# Patient Record
Sex: Male | Born: 1945 | Race: White | Hispanic: Yes | Smoking: Never smoker
Health system: Southern US, Community
[De-identification: ages and names within clinical notes are randomized; demographics above are authoritative.]

## PROBLEM LIST (undated history)

## (undated) DIAGNOSIS — Z95811 Presence of heart assist device: Secondary | ICD-10-CM

## (undated) DIAGNOSIS — Z95810 Presence of automatic (implantable) cardiac defibrillator: Secondary | ICD-10-CM

## (undated) DIAGNOSIS — I4821 Permanent atrial fibrillation: Secondary | ICD-10-CM

## (undated) DIAGNOSIS — Z950 Presence of cardiac pacemaker: Secondary | ICD-10-CM

## (undated) DIAGNOSIS — N186 End stage renal disease: Secondary | ICD-10-CM

## (undated) DIAGNOSIS — E119 Type 2 diabetes mellitus without complications: Secondary | ICD-10-CM

## (undated) DIAGNOSIS — J45909 Unspecified asthma, uncomplicated: Secondary | ICD-10-CM

## (undated) DIAGNOSIS — I509 Heart failure, unspecified: Secondary | ICD-10-CM

## (undated) DIAGNOSIS — N289 Disorder of kidney and ureter, unspecified: Secondary | ICD-10-CM

## (undated) DIAGNOSIS — K5520 Angiodysplasia of colon without hemorrhage: Secondary | ICD-10-CM

## (undated) DIAGNOSIS — G4730 Sleep apnea, unspecified: Secondary | ICD-10-CM

## (undated) HISTORY — DX: Unspecified asthma, uncomplicated: J45.909

## (undated) HISTORY — DX: Disorder of kidney and ureter, unspecified: N28.9

## (undated) HISTORY — DX: Type 2 diabetes mellitus without complications: E11.9

## (undated) HISTORY — DX: Heart failure, unspecified: I50.9

## (undated) HISTORY — DX: Sleep apnea, unspecified: G47.30

---

## 2008-11-04 HISTORY — PX: CORONARY ARTERY BYPASS GRAFT: SHX141

## 2011-11-05 HISTORY — PX: CORONARY ANGIOPLASTY WITH STENT PLACEMENT: SHX49

## 2013-11-04 HISTORY — PX: AORTIC VALVE REPLACEMENT: SHX41

## 2016-11-27 ENCOUNTER — Ambulatory Visit (INDEPENDENT_AMBULATORY_CARE_PROVIDER_SITE_OTHER): Payer: Medicare Other | Admitting: Emergency Medicine

## 2016-11-27 ENCOUNTER — Other Ambulatory Visit (INDEPENDENT_AMBULATORY_CARE_PROVIDER_SITE_OTHER): Payer: Medicare Other

## 2016-11-27 ENCOUNTER — Inpatient Hospital Stay (HOSPITAL_COMMUNITY)
Admission: EM | Admit: 2016-11-27 | Discharge: 2017-01-01 | DRG: 001 | Disposition: A | Discharge status: Home Health | Payer: Medicare Other | Attending: Cardiology | Admitting: Cardiology

## 2016-11-27 ENCOUNTER — Emergency Department (HOSPITAL_COMMUNITY): Payer: Medicare Other

## 2016-11-27 ENCOUNTER — Encounter (HOSPITAL_COMMUNITY): Payer: Self-pay | Admitting: Emergency Medicine

## 2016-11-27 ENCOUNTER — Encounter: Payer: Self-pay | Admitting: Emergency Medicine

## 2016-11-27 VITALS — BP 98/62 | HR 96 | Ht 63.0 in | Wt 206.5 lb

## 2016-11-27 DIAGNOSIS — R570 Cardiogenic shock: Secondary | ICD-10-CM | POA: Diagnosis not present

## 2016-11-27 DIAGNOSIS — D62 Acute posthemorrhagic anemia: Secondary | ICD-10-CM | POA: Diagnosis not present

## 2016-11-27 DIAGNOSIS — I1310 Hypertensive heart and chronic kidney disease without heart failure, with stage 1 through stage 4 chronic kidney disease, or unspecified chronic kidney disease: Secondary | ICD-10-CM | POA: Diagnosis present

## 2016-11-27 DIAGNOSIS — J189 Pneumonia, unspecified organism: Secondary | ICD-10-CM

## 2016-11-27 DIAGNOSIS — G4733 Obstructive sleep apnea (adult) (pediatric): Secondary | ICD-10-CM | POA: Diagnosis present

## 2016-11-27 DIAGNOSIS — N183 Chronic kidney disease, stage 3 (moderate): Secondary | ICD-10-CM | POA: Diagnosis present

## 2016-11-27 DIAGNOSIS — D696 Thrombocytopenia, unspecified: Secondary | ICD-10-CM | POA: Diagnosis present

## 2016-11-27 DIAGNOSIS — B965 Pseudomonas (aeruginosa) (mallei) (pseudomallei) as the cause of diseases classified elsewhere: Secondary | ICD-10-CM | POA: Diagnosis not present

## 2016-11-27 DIAGNOSIS — Z01818 Encounter for other preprocedural examination: Secondary | ICD-10-CM

## 2016-11-27 DIAGNOSIS — N189 Chronic kidney disease, unspecified: Secondary | ICD-10-CM

## 2016-11-27 DIAGNOSIS — Z95810 Presence of automatic (implantable) cardiac defibrillator: Secondary | ICD-10-CM

## 2016-11-27 DIAGNOSIS — I509 Heart failure, unspecified: Secondary | ICD-10-CM

## 2016-11-27 DIAGNOSIS — D638 Anemia in other chronic diseases classified elsewhere: Secondary | ICD-10-CM | POA: Diagnosis present

## 2016-11-27 DIAGNOSIS — E1122 Type 2 diabetes mellitus with diabetic chronic kidney disease: Secondary | ICD-10-CM | POA: Diagnosis present

## 2016-11-27 DIAGNOSIS — I272 Pulmonary hypertension, unspecified: Secondary | ICD-10-CM | POA: Diagnosis present

## 2016-11-27 DIAGNOSIS — Z515 Encounter for palliative care: Secondary | ICD-10-CM

## 2016-11-27 DIAGNOSIS — G4700 Insomnia, unspecified: Secondary | ICD-10-CM | POA: Diagnosis present

## 2016-11-27 DIAGNOSIS — I482 Chronic atrial fibrillation: Secondary | ICD-10-CM | POA: Diagnosis present

## 2016-11-27 DIAGNOSIS — Z9889 Other specified postprocedural states: Secondary | ICD-10-CM

## 2016-11-27 DIAGNOSIS — I5023 Acute on chronic systolic (congestive) heart failure: Secondary | ICD-10-CM | POA: Diagnosis present

## 2016-11-27 DIAGNOSIS — Y95 Nosocomial condition: Secondary | ICD-10-CM | POA: Diagnosis not present

## 2016-11-27 DIAGNOSIS — N179 Acute kidney failure, unspecified: Secondary | ICD-10-CM | POA: Diagnosis present

## 2016-11-27 DIAGNOSIS — Z951 Presence of aortocoronary bypass graft: Secondary | ICD-10-CM

## 2016-11-27 DIAGNOSIS — I130 Hypertensive heart and chronic kidney disease with heart failure and stage 1 through stage 4 chronic kidney disease, or unspecified chronic kidney disease: Principal | ICD-10-CM | POA: Diagnosis present

## 2016-11-27 DIAGNOSIS — R262 Difficulty in walking, not elsewhere classified: Secondary | ICD-10-CM

## 2016-11-27 DIAGNOSIS — Z7189 Other specified counseling: Secondary | ICD-10-CM

## 2016-11-27 DIAGNOSIS — J9811 Atelectasis: Secondary | ICD-10-CM | POA: Diagnosis not present

## 2016-11-27 DIAGNOSIS — J45909 Unspecified asthma, uncomplicated: Secondary | ICD-10-CM | POA: Diagnosis present

## 2016-11-27 DIAGNOSIS — I2510 Atherosclerotic heart disease of native coronary artery without angina pectoris: Secondary | ICD-10-CM | POA: Diagnosis present

## 2016-11-27 DIAGNOSIS — J151 Pneumonia due to Pseudomonas: Secondary | ICD-10-CM | POA: Diagnosis not present

## 2016-11-27 DIAGNOSIS — Z7982 Long term (current) use of aspirin: Secondary | ICD-10-CM

## 2016-11-27 DIAGNOSIS — I472 Ventricular tachycardia: Secondary | ICD-10-CM | POA: Diagnosis not present

## 2016-11-27 DIAGNOSIS — Z452 Encounter for adjustment and management of vascular access device: Secondary | ICD-10-CM

## 2016-11-27 DIAGNOSIS — Z79899 Other long term (current) drug therapy: Secondary | ICD-10-CM

## 2016-11-27 DIAGNOSIS — Z952 Presence of prosthetic heart valve: Secondary | ICD-10-CM | POA: Insufficient documentation

## 2016-11-27 DIAGNOSIS — D689 Coagulation defect, unspecified: Secondary | ICD-10-CM | POA: Diagnosis not present

## 2016-11-27 DIAGNOSIS — I255 Ischemic cardiomyopathy: Secondary | ICD-10-CM

## 2016-11-27 DIAGNOSIS — F419 Anxiety disorder, unspecified: Secondary | ICD-10-CM | POA: Diagnosis present

## 2016-11-27 DIAGNOSIS — R079 Chest pain, unspecified: Secondary | ICD-10-CM

## 2016-11-27 DIAGNOSIS — J440 Chronic obstructive pulmonary disease with acute lower respiratory infection: Secondary | ICD-10-CM | POA: Diagnosis present

## 2016-11-27 DIAGNOSIS — E871 Hypo-osmolality and hyponatremia: Secondary | ICD-10-CM | POA: Diagnosis present

## 2016-11-27 DIAGNOSIS — Z7901 Long term (current) use of anticoagulants: Secondary | ICD-10-CM

## 2016-11-27 DIAGNOSIS — I5082 Biventricular heart failure: Secondary | ICD-10-CM | POA: Diagnosis present

## 2016-11-27 DIAGNOSIS — K7200 Acute and subacute hepatic failure without coma: Secondary | ICD-10-CM | POA: Diagnosis not present

## 2016-11-27 DIAGNOSIS — J9600 Acute respiratory failure, unspecified whether with hypoxia or hypercapnia: Secondary | ICD-10-CM | POA: Diagnosis not present

## 2016-11-27 DIAGNOSIS — R0602 Shortness of breath: Secondary | ICD-10-CM | POA: Diagnosis not present

## 2016-11-27 DIAGNOSIS — Z95811 Presence of heart assist device: Secondary | ICD-10-CM

## 2016-11-27 DIAGNOSIS — I071 Rheumatic tricuspid insufficiency: Secondary | ICD-10-CM | POA: Diagnosis present

## 2016-11-27 DIAGNOSIS — Z419 Encounter for procedure for purposes other than remedying health state, unspecified: Secondary | ICD-10-CM

## 2016-11-27 DIAGNOSIS — J4540 Moderate persistent asthma, uncomplicated: Secondary | ICD-10-CM

## 2016-11-27 DIAGNOSIS — E11649 Type 2 diabetes mellitus with hypoglycemia without coma: Secondary | ICD-10-CM | POA: Diagnosis present

## 2016-11-27 LAB — BASIC METABOLIC PANEL
BUN: 97 mg/dL — AB (ref 6–23)
CO2: 28 meq/L (ref 19–32)
Calcium: 9.3 mg/dL (ref 8.4–10.5)
Chloride: 98 mEq/L (ref 96–112)
Creatinine, Ser: 2.29 mg/dL — ABNORMAL HIGH (ref 0.40–1.50)
GFR: 30.08 mL/min — ABNORMAL LOW (ref >60.00)
GLUCOSE: 78 mg/dL (ref 70–99)
POTASSIUM: 4.6 meq/L (ref 3.5–5.1)
SODIUM: 135 meq/L (ref 135–145)

## 2016-11-27 LAB — CBC
HCT: 27.1 % — ABNORMAL LOW (ref 39.0–52.0)
HEMOGLOBIN: 8.9 g/dL — AB (ref 13.0–17.0)
MCH: 31.1 pg (ref 26.0–34.0)
MCHC: 32.8 g/dL (ref 30.0–36.0)
MCV: 94.8 fL (ref 78.0–100.0)
Platelets: 136 10*3/uL — ABNORMAL LOW (ref 150–400)
RBC: 2.86 MIL/uL — AB (ref 4.22–5.81)
RDW: 17.4 % — ABNORMAL HIGH (ref 11.5–15.5)
WBC: 7.6 10*3/uL (ref 4.0–10.5)

## 2016-11-27 LAB — I-STAT TROPONIN, ED: TROPONIN I, POC: 0.07 ng/mL (ref 0.00–0.08)

## 2016-11-27 LAB — BRAIN NATRIURETIC PEPTIDE: Pro B Natriuretic peptide (BNP): 1746 pg/mL — ABNORMAL HIGH (ref 0.0–100.0)

## 2016-11-27 MED ORDER — FLUTICASONE PROPIONATE HFA 110 MCG/ACT IN AERO: 2.0000 | INHALATION_SPRAY | Freq: Two times a day (BID) | RESPIRATORY_TRACT | 5 refills | Status: DC

## 2016-11-27 MED ORDER — ALBUTEROL SULFATE HFA 108 (90 BASE) MCG/ACT IN AERS: 2.0000 | INHALATION_SPRAY | RESPIRATORY_TRACT | 5 refills | Status: DC | PRN

## 2016-11-27 MED ORDER — ALBUTEROL SULFATE (2.5 MG/3ML) 0.083% IN NEBU
2.5000 mg | INHALATION_SOLUTION | RESPIRATORY_TRACT | 5 refills | Status: DC
Start: 2016-11-27 — End: 2018-03-05

## 2016-11-27 NOTE — Progress Notes (Signed)
Subjective:    Patient ID: Trevor [name], male    DOB: November 08, 1945, 71 y.o.   MRN: [id number]  HPI 71 year old never smoker with a history of CAD / CABG 2010, tissue AVR in May [id number] and systolic CHF for which he has been followed exclusively in [place]. Also with a history of fixed (lifelong) asthma / COPD, OSA on CPAP. He moved here about a week ago and has gained about 5 pounds, is gaining some edema. Per his home CHF coordinator he increased his torsemide last night. He was just d/c from [facility] in [facility] on 1/9 for CHF exac and then associated renal failure. He underwent ultrafiltration but not HD. Has been in hospital 4x in the last 4 months. He has a new CPAP pressure since 6 weeks ago, has been less compliant with CPAP due to this change to auto-set 5-20 (used to be on 7cm H2O).  Last wore it last night, feels that the pressure is too high to tolerate. He was unable to lay flat last night.   I don't have any records from his [facility] healthcare providers, but do have their contact numbers to obtain.   He reports that he has run out of ProAir today, Flovent, albuterol nebs. Had been using ProAir more frequently. Stiolto was on his discharge medication list from hospital but he is not taking.   Review of Systems  Constitutional: Positive for unexpected weight change. Negative for fever.  HENT: Positive for congestion. Negative for dental problem, ear pain, nosebleeds, postnasal drip, rhinorrhea, sinus pressure, sneezing, sore throat and trouble swallowing.   Eyes: Negative for redness and itching.  Respiratory: Positive for cough, chest tightness and shortness of breath. Negative for wheezing.   Cardiovascular: Positive for leg swelling. Negative for palpitations.  Gastrointestinal: Negative for nausea and vomiting.  Genitourinary: Negative for dysuria.  Musculoskeletal: Positive for joint swelling.  Skin: Negative for rash.  Neurological: Negative for headaches.  Hematological: Does not  bruise/bleed easily.  Psychiatric/Behavioral: Negative for dysphoric mood. The patient is nervous/anxious.    Past Medical History:  Diagnosis Date  . Asthma   . CHF (congestive heart failure) ([place])   . Diabetes ([place])   . Kidney disease   . Sleep apnea      No family history on file.   Social History   Social History  . Marital status: Widowed    Spouse name: N/A  . Number of children: N/A  . Years of education: N/A   Occupational History  . Not on file.   Social History Main Topics  . Smoking status: Never Smoker  . Smokeless tobacco: Never Used  . Alcohol use No  . Drug use: No  . Sexual activity: Not on file   Other Topics Concern  . Not on file   Social History Narrative   Patient is a widow. Moved to [place] from [place], [place]. Currently lives with his sister Breaker Capito.      No Known Allergies   No outpatient prescriptions prior to visit.   No facility-administered medications prior to visit.    Allergies as of 11/27/2016   No Known Allergies     Medication List       Accurate as of 11/27/16  1:27 PM. Always use your most recent med list.          albuterol 108 (90 Base) MCG/ACT inhaler Commonly known as:  PROVENTIL HFA;VENTOLIN HFA Inhale 2 puffs into the lungs every 4 (four) hours  as needed for wheezing or shortness of breath.   albuterol (2.5 MG/3ML) 0.083% nebulizer solution Commonly known as:  PROVENTIL Take 3 mLs (2.5 mg total) by nebulization every 4 (four) hours. And as needed   apixaban 2.5 MG Tabs tablet Commonly known as:  ELIQUIS Take 2.5 mg by mouth 2 (two) times daily.   ascorbic acid 1000 MG tablet Commonly known as:  VITAMIN C Take 1,000 mg by mouth daily.   aspirin EC 81 MG tablet Take 81 mg by mouth daily.   atorvastatin 40 MG tablet Commonly known as:  LIPITOR Take 40 mg by mouth daily.   benzonatate 100 MG capsule Commonly known as:  TESSALON Take by mouth 3 (three) times daily as needed for cough.     carvedilol 6.25 MG tablet Commonly known as:  COREG Take 6.25 mg by mouth 2 (two) times daily with a meal.   ENTRESTO 24-26 MG Generic drug:  sacubitril-valsartan Take 1 tablet by mouth 2 (two) times daily.   fluticasone 110 MCG/ACT inhaler Commonly known as:  FLOVENT HFA Inhale 2 puffs into the lungs 2 (two) times daily.   fluticasone 50 MCG/ACT nasal spray Commonly known as:  FLONASE Place into both nostrils daily.   hydrALAZINE 25 MG tablet Commonly known as:  APRESOLINE Take 25 mg by mouth 3 (three) times daily.   insulin glargine 100 UNIT/ML injection Commonly known as:  LANTUS Inject 20 Units into the skin at bedtime.   insulin lispro 100 UNIT/ML injection Commonly known as:  HUMALOG Inject 50 Units into the skin 3 (three) times daily before meals.   isosorbide dinitrate 30 MG tablet Commonly known as:  ISORDIL Take 15 mg by mouth 2 (two) times daily.   isosorbide mononitrate 30 MG 24 hr tablet Commonly known as:  IMDUR Take 30 mg by mouth daily.   levothyroxine 25 MCG tablet Commonly known as:  SYNTHROID, LEVOTHROID Take 25 mcg by mouth daily before breakfast.   multivitamin tablet Take 1 tablet by mouth daily.   potassium chloride SA 20 MEQ tablet Commonly known as:  K-DUR,KLOR-CON Take 20 mEq by mouth 2 (two) times daily.   STIOLTO RESPIMAT 2.5-2.5 MCG/ACT Aers Generic drug:  Tiotropium Bromide-Olodaterol Inhale 2 puffs into the lungs daily.   torsemide 20 MG tablet Commonly known as:  DEMADEX Take 40 mg by mouth daily.   traMADol 50 MG tablet Commonly known as:  ULTRAM Take 50 mg by mouth every 6 (six) hours as needed.          Objective:   Physical Exam Vitals:   11/27/16 1104  BP: 98/62  Pulse: 96  Weight: 206 lb 8 oz (93.7 kg)  Height: 5\' 3"  (1.6 m)   'Gen: Pleasant, well-nourished, in no distress,  normal affect on [place] O2  ENT: No lesions,  mouth clear,  oropharynx clear, no postnasal drip  Neck: No JVD, no TMG, no carotid  bruits  Lungs: No use of accessory muscles, clear without rales or rhonchi, no wheeze  Cardiovascular: RRR, heart sounds normal, no murmur or gallops, trace peripheral edema  Musculoskeletal: No deformities, no cyanosis or clubbing  Neuro: alert, non focal  Skin: Warm, no lesions or rashes       Assessment & Plan:  Asthma, chronic Plan to continue Stiolto. We will refill albuterol nebulizer and pro-air. Continue Flovent.  Cardiomyopathy, ischemic Management has been difficult and his symptoms have been labile. Note his recent hospitalizations. I will continue his existing medications including the increased torsemide  and refer him as soon as possible to our Advanced CHF clinic. Will attempt to get records from [facility]. Check BNP and BMP today  Chronic renal insufficiency Will check renal panel in preparation for probable adjustment of his diuretic regimen by cardiology   Status post aortic valve replacement He is on eliquis.   OSA (obstructive sleep apnea) Continue current auto-set 5-20 cm H2O. May be able to adjust based on his most recent PSG results. Will obtain from [facility]  Baltazar Apo, MD, PhD 11/27/2016, 1:27 PM [facility] [phone] or if no answer [phone]

## 2016-11-27 NOTE — Assessment & Plan Note (Signed)
He is on eliquis

## 2016-11-27 NOTE — ED Triage Notes (Signed)
Patient arrives via EMS with complaint of shortness of breath, lower extremity swelling/pain, and headache. States onset was 2-3 days ago. History of CHF with admission earlier this month in [place] for similar.

## 2016-11-27 NOTE — Assessment & Plan Note (Signed)
Continue current auto-set 5-20 cm H2O. May be able to adjust based on his most recent PSG results. Will obtain from [facility]

## 2016-11-27 NOTE — Assessment & Plan Note (Signed)
Plan to continue [place]. We will refill albuterol nebulizer and pro-air. Continue Flovent.

## 2016-11-27 NOTE — Assessment & Plan Note (Signed)
Will check renal panel in preparation for probable adjustment of his diuretic regimen by cardiology

## 2016-11-27 NOTE — Patient Instructions (Signed)
We will refill your albuterol, ProAir, Flovent so that you can resume them Continue current CPAP for now as best you can. We will obtain your sleep study from [facility] and see if adjustments can be made.  You need to be seen ASAP in our Advanced CHF clinic. We will work on this.  For now continue your torsemide at twice normal dose until you reach your target dry weight.  Lab work today to check your kidney function.  Follow with Dr Lamonte Sakai in 1 month

## 2016-11-27 NOTE — Assessment & Plan Note (Addendum)
Management has been difficult and his symptoms have been labile. Note his recent hospitalizations. I will continue his existing medications including the increased torsemide and refer him as soon as possible to our Advanced CHF clinic. Will attempt to get records from [facility]. Check BNP and BMP today

## 2016-11-28 ENCOUNTER — Inpatient Hospital Stay (HOSPITAL_COMMUNITY): Payer: Medicare Other

## 2016-11-28 ENCOUNTER — Inpatient Hospital Stay (HOSPITAL_COMMUNITY): Admission: RE | Admit: 2016-11-28 | Payer: Medicare Other | Source: Ambulatory Visit | Admitting: Internal Medicine

## 2016-11-28 ENCOUNTER — Encounter (HOSPITAL_COMMUNITY): Payer: Self-pay | Admitting: Internal Medicine

## 2016-11-28 DIAGNOSIS — I1310 Hypertensive heart and chronic kidney disease without heart failure, with stage 1 through stage 4 chronic kidney disease, or unspecified chronic kidney disease: Secondary | ICD-10-CM | POA: Diagnosis present

## 2016-11-28 DIAGNOSIS — I472 Ventricular tachycardia: Secondary | ICD-10-CM | POA: Diagnosis not present

## 2016-11-28 DIAGNOSIS — I130 Hypertensive heart and chronic kidney disease with heart failure and stage 1 through stage 4 chronic kidney disease, or unspecified chronic kidney disease: Secondary | ICD-10-CM | POA: Diagnosis not present

## 2016-11-28 DIAGNOSIS — Z515 Encounter for palliative care: Secondary | ICD-10-CM | POA: Diagnosis not present

## 2016-11-28 DIAGNOSIS — I361 Nonrheumatic tricuspid (valve) insufficiency: Secondary | ICD-10-CM | POA: Diagnosis not present

## 2016-11-28 DIAGNOSIS — Y95 Nosocomial condition: Secondary | ICD-10-CM | POA: Diagnosis not present

## 2016-11-28 DIAGNOSIS — N19 Unspecified kidney failure: Secondary | ICD-10-CM

## 2016-11-28 DIAGNOSIS — R0602 Shortness of breath: Secondary | ICD-10-CM | POA: Diagnosis present

## 2016-11-28 DIAGNOSIS — N170 Acute kidney failure with tubular necrosis: Secondary | ICD-10-CM

## 2016-11-28 DIAGNOSIS — Z01810 Encounter for preprocedural cardiovascular examination: Secondary | ICD-10-CM | POA: Diagnosis not present

## 2016-11-28 DIAGNOSIS — D689 Coagulation defect, unspecified: Secondary | ICD-10-CM | POA: Diagnosis not present

## 2016-11-28 DIAGNOSIS — E871 Hypo-osmolality and hyponatremia: Secondary | ICD-10-CM | POA: Diagnosis present

## 2016-11-28 DIAGNOSIS — D62 Acute posthemorrhagic anemia: Secondary | ICD-10-CM | POA: Diagnosis not present

## 2016-11-28 DIAGNOSIS — I5023 Acute on chronic systolic (congestive) heart failure: Secondary | ICD-10-CM

## 2016-11-28 DIAGNOSIS — Z952 Presence of prosthetic heart valve: Secondary | ICD-10-CM | POA: Diagnosis not present

## 2016-11-28 DIAGNOSIS — N183 Chronic kidney disease, stage 3 (moderate): Secondary | ICD-10-CM

## 2016-11-28 DIAGNOSIS — Z951 Presence of aortocoronary bypass graft: Secondary | ICD-10-CM | POA: Diagnosis not present

## 2016-11-28 DIAGNOSIS — I501 Left ventricular failure: Secondary | ICD-10-CM | POA: Diagnosis not present

## 2016-11-28 DIAGNOSIS — Z95811 Presence of heart assist device: Secondary | ICD-10-CM | POA: Diagnosis not present

## 2016-11-28 DIAGNOSIS — J9811 Atelectasis: Secondary | ICD-10-CM | POA: Diagnosis not present

## 2016-11-28 DIAGNOSIS — J151 Pneumonia due to Pseudomonas: Secondary | ICD-10-CM | POA: Diagnosis not present

## 2016-11-28 DIAGNOSIS — N179 Acute kidney failure, unspecified: Secondary | ICD-10-CM | POA: Diagnosis present

## 2016-11-28 DIAGNOSIS — I255 Ischemic cardiomyopathy: Secondary | ICD-10-CM | POA: Diagnosis present

## 2016-11-28 DIAGNOSIS — I509 Heart failure, unspecified: Secondary | ICD-10-CM | POA: Diagnosis not present

## 2016-11-28 DIAGNOSIS — D638 Anemia in other chronic diseases classified elsewhere: Secondary | ICD-10-CM | POA: Diagnosis present

## 2016-11-28 DIAGNOSIS — J9600 Acute respiratory failure, unspecified whether with hypoxia or hypercapnia: Secondary | ICD-10-CM | POA: Diagnosis not present

## 2016-11-28 DIAGNOSIS — I340 Nonrheumatic mitral (valve) insufficiency: Secondary | ICD-10-CM | POA: Diagnosis not present

## 2016-11-28 DIAGNOSIS — I482 Chronic atrial fibrillation: Secondary | ICD-10-CM | POA: Diagnosis not present

## 2016-11-28 DIAGNOSIS — N184 Chronic kidney disease, stage 4 (severe): Secondary | ICD-10-CM | POA: Diagnosis not present

## 2016-11-28 DIAGNOSIS — Z7189 Other specified counseling: Secondary | ICD-10-CM | POA: Diagnosis not present

## 2016-11-28 DIAGNOSIS — G4733 Obstructive sleep apnea (adult) (pediatric): Secondary | ICD-10-CM | POA: Diagnosis present

## 2016-11-28 DIAGNOSIS — I2510 Atherosclerotic heart disease of native coronary artery without angina pectoris: Secondary | ICD-10-CM | POA: Diagnosis not present

## 2016-11-28 DIAGNOSIS — N189 Chronic kidney disease, unspecified: Secondary | ICD-10-CM

## 2016-11-28 DIAGNOSIS — E1122 Type 2 diabetes mellitus with diabetic chronic kidney disease: Secondary | ICD-10-CM | POA: Diagnosis not present

## 2016-11-28 DIAGNOSIS — I5082 Biventricular heart failure: Secondary | ICD-10-CM | POA: Diagnosis present

## 2016-11-28 DIAGNOSIS — K7200 Acute and subacute hepatic failure without coma: Secondary | ICD-10-CM | POA: Diagnosis not present

## 2016-11-28 DIAGNOSIS — J440 Chronic obstructive pulmonary disease with acute lower respiratory infection: Secondary | ICD-10-CM | POA: Diagnosis present

## 2016-11-28 DIAGNOSIS — J4540 Moderate persistent asthma, uncomplicated: Secondary | ICD-10-CM

## 2016-11-28 DIAGNOSIS — I5043 Acute on chronic combined systolic (congestive) and diastolic (congestive) heart failure: Secondary | ICD-10-CM | POA: Diagnosis not present

## 2016-11-28 DIAGNOSIS — E11649 Type 2 diabetes mellitus with hypoglycemia without coma: Secondary | ICD-10-CM | POA: Diagnosis present

## 2016-11-28 DIAGNOSIS — D696 Thrombocytopenia, unspecified: Secondary | ICD-10-CM | POA: Diagnosis present

## 2016-11-28 DIAGNOSIS — R570 Cardiogenic shock: Secondary | ICD-10-CM | POA: Diagnosis not present

## 2016-11-28 LAB — URINALYSIS, ROUTINE W REFLEX MICROSCOPIC
BILIRUBIN URINE: NEGATIVE
GLUCOSE, UA: NEGATIVE mg/dL
HGB URINE DIPSTICK: NEGATIVE
KETONES UR: NEGATIVE mg/dL
LEUKOCYTES UA: NEGATIVE
NITRITE: NEGATIVE
PROTEIN: 30 mg/dL — AB
Specific Gravity, Urine: 1.008 (ref 1.005–1.030)
Squamous Epithelial / LPF: NONE SEEN
pH: 5 (ref 5.0–8.0)

## 2016-11-28 LAB — BASIC METABOLIC PANEL
Anion gap: 10 (ref 5–15)
BUN: 99 mg/dL — AB (ref 6–20)
CALCIUM: 9.2 mg/dL (ref 8.9–10.3)
CO2: 27 mmol/L (ref 22–32)
CREATININE: 2.39 mg/dL — AB (ref 0.61–1.24)
Chloride: 98 mmol/L — ABNORMAL LOW (ref 101–111)
GFR calc non Af Amer: 26 mL/min — ABNORMAL LOW (ref >60)
GFR, EST AFRICAN AMERICAN: 30 mL/min — AB (ref >60)
GLUCOSE: 67 mg/dL (ref 65–99)
Potassium: 4.9 mmol/L (ref 3.5–5.1)
Sodium: 135 mmol/L (ref 135–145)

## 2016-11-28 LAB — COMPREHENSIVE METABOLIC PANEL
ALT: 34 U/L (ref 17–63)
AST: 46 U/L — AB (ref 15–41)
Albumin: 3.5 g/dL (ref 3.5–5.0)
Alkaline Phosphatase: 119 U/L (ref 38–126)
Anion gap: 12 (ref 5–15)
BUN: 97 mg/dL — ABNORMAL HIGH (ref 6–20)
CHLORIDE: 98 mmol/L — AB (ref 101–111)
CO2: 28 mmol/L (ref 22–32)
Calcium: 9 mg/dL (ref 8.9–10.3)
Creatinine, Ser: 2.26 mg/dL — ABNORMAL HIGH (ref 0.61–1.24)
GFR, EST AFRICAN AMERICAN: 32 mL/min — AB (ref >60)
GFR, EST NON AFRICAN AMERICAN: 27 mL/min — AB (ref >60)
Glucose, Bld: 119 mg/dL — ABNORMAL HIGH (ref 65–99)
POTASSIUM: 3.9 mmol/L (ref 3.5–5.1)
Sodium: 138 mmol/L (ref 135–145)
Total Bilirubin: 1 mg/dL (ref 0.3–1.2)
Total Protein: 6.5 g/dL (ref 6.5–8.1)

## 2016-11-28 LAB — RETICULOCYTES
RBC.: 2.92 MIL/uL — ABNORMAL LOW (ref 4.22–5.81)
Retic Count, Absolute: 55.5 10*3/uL (ref 19.0–186.0)
Retic Ct Pct: 1.9 % (ref 0.4–3.1)

## 2016-11-28 LAB — BRAIN NATRIURETIC PEPTIDE: B Natriuretic Peptide: 1825.3 pg/mL — ABNORMAL HIGH (ref 0.0–100.0)

## 2016-11-28 LAB — CBC
HCT: 27.8 % — ABNORMAL LOW (ref 39.0–52.0)
Hemoglobin: 9 g/dL — ABNORMAL LOW (ref 13.0–17.0)
MCH: 30.8 pg (ref 26.0–34.0)
MCHC: 32.4 g/dL (ref 30.0–36.0)
MCV: 95.2 fL (ref 78.0–100.0)
PLATELETS: 115 10*3/uL — AB (ref 150–400)
RBC: 2.92 MIL/uL — ABNORMAL LOW (ref 4.22–5.81)
RDW: 17.6 % — AB (ref 11.5–15.5)
WBC: 6.2 10*3/uL (ref 4.0–10.5)

## 2016-11-28 LAB — MRSA PCR SCREENING: MRSA BY PCR: NEGATIVE

## 2016-11-28 LAB — IRON AND TIBC
Iron: 37 ug/dL — ABNORMAL LOW (ref 45–182)
SATURATION RATIOS: 11 % — AB (ref 17.9–39.5)
TIBC: 326 ug/dL (ref 250–450)
UIBC: 289 ug/dL

## 2016-11-28 LAB — SODIUM, URINE, RANDOM: Sodium, Ur: 32 mmol/L

## 2016-11-28 LAB — COOXEMETRY PANEL
Carboxyhemoglobin: 1.6 % — ABNORMAL HIGH (ref 0.5–1.5)
Methemoglobin: 1.1 % (ref 0.0–1.5)
O2 Saturation: 55.7 %
Total hemoglobin: 8 g/dL — ABNORMAL LOW (ref 12.0–16.0)

## 2016-11-28 LAB — CBG MONITORING, ED
GLUCOSE-CAPILLARY: 72 mg/dL (ref 65–99)
Glucose-Capillary: 77 mg/dL (ref 65–99)
Glucose-Capillary: 126 mg/dL — ABNORMAL HIGH (ref 65–99)

## 2016-11-28 LAB — GLUCOSE, CAPILLARY
GLUCOSE-CAPILLARY: 144 mg/dL — AB (ref 65–99)
Glucose-Capillary: 207 mg/dL — ABNORMAL HIGH (ref 65–99)

## 2016-11-28 LAB — CREATININE, URINE, RANDOM: Creatinine, Urine: 55.94 mg/dL

## 2016-11-28 LAB — MAGNESIUM: Magnesium: 2 mg/dL (ref 1.7–2.4)

## 2016-11-28 LAB — FERRITIN: Ferritin: 193 ng/mL (ref 24–336)

## 2016-11-28 LAB — FOLATE: FOLATE: 49.5 ng/mL (ref >5.9)

## 2016-11-28 LAB — VITAMIN B12: VITAMIN B 12: 1060 pg/mL — AB (ref 180–914)

## 2016-11-28 MED ORDER — ALBUTEROL SULFATE (2.5 MG/3ML) 0.083% IN NEBU
2.5000 mg | INHALATION_SOLUTION | RESPIRATORY_TRACT | Status: DC
  Administered 2016-11-28 (×3): 2.5 mg via RESPIRATORY_TRACT
  Filled 2016-11-28 (×3): qty 3

## 2016-11-28 MED ORDER — MILRINONE LACTATE IN DEXTROSE 20-5 MG/100ML-% IV SOLN
0.2500 ug/kg/min | INTRAVENOUS | Status: DC
  Administered 2016-11-28 – 2016-12-04 (×11): 0.25 ug/kg/min via INTRAVENOUS
  Administered 2016-12-05 – 2016-12-16 (×25): 0.375 ug/kg/min via INTRAVENOUS
  Administered 2016-12-17 (×2): 0.25 ug/kg/min via INTRAVENOUS
  Administered 2016-12-17: 0.375 ug/kg/min via INTRAVENOUS
  Administered 2016-12-18 – 2016-12-23 (×10): 0.25 ug/kg/min via INTRAVENOUS
  Filled 2016-11-28 (×27): qty 100
  Filled 2016-11-28: qty 200
  Filled 2016-11-28 (×26): qty 100

## 2016-11-28 MED ORDER — ARFORMOTEROL TARTRATE 15 MCG/2ML IN NEBU
15.0000 ug | INHALATION_SOLUTION | Freq: Two times a day (BID) | RESPIRATORY_TRACT | Status: DC
Start: 2016-11-28 — End: 2017-01-01
  Administered 2016-11-28 – 2017-01-01 (×65): 15 ug via RESPIRATORY_TRACT
  Filled 2016-11-28 (×68): qty 2

## 2016-11-28 MED ORDER — ADULT MULTIVITAMIN W/MINERALS CH
1.0000 | ORAL_TABLET | Freq: Every day | ORAL | Status: DC
  Administered 2016-11-28 – 2016-12-12 (×14): 1 via ORAL
  Filled 2016-11-28 (×14): qty 1

## 2016-11-28 MED ORDER — FUROSEMIDE 10 MG/ML IJ SOLN
120.0000 mg | Freq: Once | INTRAVENOUS | Status: DC
  Filled 2016-11-28: qty 12

## 2016-11-28 MED ORDER — CARVEDILOL 12.5 MG PO TABS
6.2500 mg | ORAL_TABLET | Freq: Two times a day (BID) | ORAL | Status: DC
  Administered 2016-11-28: 6.25 mg via ORAL
  Filled 2016-11-28: qty 1

## 2016-11-28 MED ORDER — UMECLIDINIUM BROMIDE 62.5 MCG/INH IN AEPB
1.0000 | INHALATION_SPRAY | Freq: Every day | RESPIRATORY_TRACT | Status: DC
  Administered 2016-11-28 – 2017-01-01 (×26): 1 via RESPIRATORY_TRACT
  Filled 2016-11-28 (×7): qty 7

## 2016-11-28 MED ORDER — FUROSEMIDE 10 MG/ML IJ SOLN
80.0000 mg | Freq: Two times a day (BID) | INTRAMUSCULAR | Status: DC
  Administered 2016-11-28 – 2016-12-02 (×9): 80 mg via INTRAVENOUS
  Filled 2016-11-28 (×10): qty 8

## 2016-11-28 MED ORDER — FLUTICASONE PROPIONATE HFA 110 MCG/ACT IN AERO: 2.0000 | INHALATION_SPRAY | Freq: Two times a day (BID) | RESPIRATORY_TRACT | Status: DC

## 2016-11-28 MED ORDER — ASPIRIN EC 81 MG PO TBEC
81.0000 mg | DELAYED_RELEASE_TABLET | Freq: Every day | ORAL | Status: DC
  Administered 2016-11-28 – 2016-12-02 (×5): 81 mg via ORAL
  Filled 2016-11-28 (×5): qty 1

## 2016-11-28 MED ORDER — MORPHINE SULFATE (PF) 4 MG/ML IV SOLN
4.0000 mg | Freq: Once | INTRAVENOUS | Status: AC
  Administered 2016-11-28: 4 mg via INTRAVENOUS
  Filled 2016-11-28: qty 1

## 2016-11-28 MED ORDER — TRAMADOL HCL 50 MG PO TABS
50.0000 mg | ORAL_TABLET | Freq: Four times a day (QID) | ORAL | Status: DC | PRN
  Administered 2016-11-28 – 2016-12-12 (×22): 50 mg via ORAL
  Filled 2016-11-28 (×22): qty 1

## 2016-11-28 MED ORDER — ALBUTEROL SULFATE (2.5 MG/3ML) 0.083% IN NEBU
2.5000 mg | INHALATION_SOLUTION | Freq: Three times a day (TID) | RESPIRATORY_TRACT | Status: DC
  Administered 2016-11-28 – 2016-11-29 (×2): 2.5 mg via RESPIRATORY_TRACT
  Filled 2016-11-28 (×2): qty 3

## 2016-11-28 MED ORDER — INSULIN ASPART 100 UNIT/ML ~~LOC~~ SOLN
0.0000 [IU] | Freq: Three times a day (TID) | SUBCUTANEOUS | Status: DC
  Administered 2016-11-29: 1 [IU] via SUBCUTANEOUS
  Administered 2016-11-29: 3 [IU] via SUBCUTANEOUS
  Administered 2016-11-29 – 2016-12-02 (×8): 2 [IU] via SUBCUTANEOUS
  Administered 2016-12-02: 1 [IU] via SUBCUTANEOUS
  Administered 2016-12-02: 5 [IU] via SUBCUTANEOUS
  Administered 2016-12-03 (×2): 3 [IU] via SUBCUTANEOUS
  Administered 2016-12-03: 1 [IU] via SUBCUTANEOUS
  Administered 2016-12-04 (×2): 2 [IU] via SUBCUTANEOUS
  Administered 2016-12-04: 1 [IU] via SUBCUTANEOUS
  Administered 2016-12-05: 3 [IU] via SUBCUTANEOUS
  Administered 2016-12-05: 2 [IU] via SUBCUTANEOUS
  Administered 2016-12-05: 3 [IU] via SUBCUTANEOUS
  Administered 2016-12-06: 5 [IU] via SUBCUTANEOUS
  Administered 2016-12-06: 1 [IU] via SUBCUTANEOUS
  Administered 2016-12-06: 5 [IU] via SUBCUTANEOUS
  Administered 2016-12-07: 3 [IU] via SUBCUTANEOUS
  Administered 2016-12-07: 2 [IU] via SUBCUTANEOUS
  Administered 2016-12-07 – 2016-12-08 (×2): 5 [IU] via SUBCUTANEOUS
  Administered 2016-12-08: 3 [IU] via SUBCUTANEOUS
  Administered 2016-12-08: 2 [IU] via SUBCUTANEOUS
  Administered 2016-12-09: 3 [IU] via SUBCUTANEOUS
  Administered 2016-12-09: 2 [IU] via SUBCUTANEOUS

## 2016-11-28 MED ORDER — INSULIN GLARGINE 100 UNIT/ML ~~LOC~~ SOLN: 20.0000 [IU] | Freq: Every day | SUBCUTANEOUS | Status: DC

## 2016-11-28 MED ORDER — FLUTICASONE PROPIONATE 50 MCG/ACT NA SUSP
1.0000 | Freq: Every day | NASAL | Status: DC
  Administered 2016-11-29 – 2017-01-01 (×28): 1 via NASAL
  Filled 2016-11-28 (×6): qty 16

## 2016-11-28 MED ORDER — NOREPINEPHRINE BITARTRATE 1 MG/ML IV SOLN
1.0000 ug/min | INTRAVENOUS | Status: DC
  Administered 2016-11-28 – 2016-12-01 (×4): 3 ug/min via INTRAVENOUS
  Filled 2016-11-28 (×5): qty 4

## 2016-11-28 MED ORDER — ATORVASTATIN CALCIUM 40 MG PO TABS
40.0000 mg | ORAL_TABLET | Freq: Every day | ORAL | Status: DC
  Administered 2016-11-29 – 2016-12-12 (×13): 40 mg via ORAL
  Filled 2016-11-28 (×14): qty 1

## 2016-11-28 MED ORDER — FUROSEMIDE 10 MG/ML IJ SOLN
160.0000 mg | Freq: Once | INTRAVENOUS | Status: DC
  Filled 2016-11-28: qty 16

## 2016-11-28 MED ORDER — ALBUTEROL SULFATE (2.5 MG/3ML) 0.083% IN NEBU: 2.5000 mg | INHALATION_SOLUTION | RESPIRATORY_TRACT | Status: DC | PRN

## 2016-11-28 MED ORDER — SODIUM CHLORIDE 0.9 % IV BOLUS (SEPSIS)
100.0000 mL | Freq: Once | INTRAVENOUS | Status: AC
  Administered 2016-11-28: 100 mL via INTRAVENOUS

## 2016-11-28 MED ORDER — INSULIN GLARGINE 100 UNIT/ML ~~LOC~~ SOLN
10.0000 [IU] | Freq: Every day | SUBCUTANEOUS | Status: DC
  Administered 2016-11-28 – 2016-12-12 (×15): 10 [IU] via SUBCUTANEOUS
  Filled 2016-11-28 (×16): qty 0.1

## 2016-11-28 MED ORDER — BUDESONIDE 0.5 MG/2ML IN SUSP
0.5000 mg | Freq: Two times a day (BID) | RESPIRATORY_TRACT | Status: DC
  Administered 2016-11-28 – 2017-01-01 (×67): 0.5 mg via RESPIRATORY_TRACT
  Filled 2016-11-28 (×69): qty 2

## 2016-11-28 MED ORDER — OXYMETAZOLINE HCL 0.05 % NA SOLN
1.0000 | Freq: Once | NASAL | Status: AC
  Administered 2016-11-28: 1 via NASAL
  Filled 2016-11-28: qty 15

## 2016-11-28 MED ORDER — LEVOTHYROXINE SODIUM 25 MCG PO TABS
25.0000 ug | ORAL_TABLET | Freq: Every day | ORAL | Status: DC
  Administered 2016-11-29 – 2017-01-01 (×33): 25 ug via ORAL
  Filled 2016-11-28 (×35): qty 1

## 2016-11-28 MED ORDER — ISOSORBIDE MONONITRATE ER 30 MG PO TB24
30.0000 mg | ORAL_TABLET | Freq: Every day | ORAL | Status: DC
  Administered 2016-11-28: 30 mg via ORAL
  Filled 2016-11-28: qty 1

## 2016-11-28 MED ORDER — FUROSEMIDE 10 MG/ML IJ SOLN
120.0000 mg | Freq: Once | INTRAMUSCULAR | Status: DC
  Administered 2016-11-28: 120 mg via INTRAVENOUS
  Filled 2016-11-28: qty 12

## 2016-11-28 MED ORDER — APIXABAN 2.5 MG PO TABS
2.5000 mg | ORAL_TABLET | Freq: Two times a day (BID) | ORAL | Status: DC
  Filled 2016-11-28: qty 1

## 2016-11-28 MED ORDER — APIXABAN 5 MG PO TABS
5.0000 mg | ORAL_TABLET | Freq: Two times a day (BID) | ORAL | Status: DC
  Administered 2016-11-28 – 2016-12-01 (×7): 5 mg via ORAL
  Filled 2016-11-28 (×8): qty 1

## 2016-11-28 NOTE — Progress Notes (Signed)
  Echocardiogram 2D Echocardiogram has been performed.  [name] 11/28/2016, 4:35 PM

## 2016-11-28 NOTE — Progress Notes (Addendum)
Progress Note  CVC line pulled back to 16cm Line sutured in place  Dressing clean, dry, intact.   Hayden Pedro, AG-ACNP [facility]  Pgr: [phone]  PCCM Pgr: [phone]

## 2016-11-28 NOTE — Progress Notes (Signed)
eLink Physician-Brief Progress Note Patient Name: Trevor [name] DOB: November 16, 1945 MRN: [id number]   Date of Service  11/28/2016  HPI/Events of Note  Pt with severe chf being started on levophed and milrinone   eICU Interventions  No additional PCCM input needed, rx per cardiology service, will follow      Intervention Category Major Interventions: Shock - evaluation and management  Christinia Gully 11/28/2016, 3:30 PM

## 2016-11-28 NOTE — Progress Notes (Signed)
Advanced Heart Failure Team Consult Note  Referring Physician: Dr Alcario Drought Primary Physician:  Primary Cardiologist:  Dr Gust Rung [facility] [phone]  Reason for Consultation: Heart Failure   HPI:   Trevor [name] is a 71 y.o. male with a history of CAD, CABG x4 2010, OSA, AS with TAVR 2015, CKD, DM2, PAF, asthma, and [employer] ICD.   Recently relocated to [facility] from [facility] about a week ago. Over the last couple of years he was been admitted for A/C systolic heart 4-5 times a year. Most recent admit was in January. Sounds like the hospitalization was complicated by cardiorenal syndrome. While admitted there was discussion about dialysis but he was able to avoid it. Ceatinine runs around 2.5. He was discharged on torsemide 40 mg daily. Weight usually runs 200-202 pounds. On Monday weight went up 205 pounds and so doubled his torsemide to 80 mg but had little improvement. Says he has been struggling to perform ADLs due to increased dyspnea.   Today he presented with increased dyspnea and leg edema. In the ED he was hypotensive so he was given IV fluids. He remains SOB with exertion. Having episodes of NSVT.  and low extremity edema. CXR with no acute process. Pertinent admission labs include: BNP 1746, Hgb 8.9, K 4.6, and creatinine 2.29.    Review of Systems: [y] = yes, [ ]  = no   General: Weight gain [Y ]; Weight loss [ ] ; Anorexia [ ] ; Fatigue [Y ]; Fever [ ] ; Chills [ ] ; Weakness [ Y]  Cardiac: Chest pain/pressure [ ] ; Resting SOB [ ] ; Exertional SOB [ Y]; Orthopnea [Y ]; Pedal Edema [ Y]; Palpitations [ ] ; Syncope [ ] ; Presyncope [ ] ; Paroxysmal nocturnal dyspnea[ ]   Pulmonary: Cough [ ] ; Wheezing[ ] ; Hemoptysis[ ] ; Sputum [ ] ; Snoring [ ]   GI: Vomiting[ ] ; Dysphagia[ ] ; Melena[ ] ; Hematochezia [ ] ; Heartburn[ ] ; Abdominal pain [ ] ; Constipation [ ] ; Diarrhea [ ] ; BRBPR [ ]   GU: Hematuria[ ] ; Dysuria [ ] ; Nocturia[ ]   Vascular: Pain in legs with  walking [ Y]; Pain in feet with lying flat [ ] ; Non-healing sores [ ] ; Stroke [ ] ; TIA [ ] ; Slurred speech [ ] ;  Neuro: Headaches[ ] ; Vertigo[ ] ; Seizures[ ] ; Paresthesias[ ] ;Blurred vision [ ] ; Diplopia [ ] ; Vision changes [ ]   Ortho/Skin: Arthritis [ ] ; Joint pain [Y ]; Muscle pain [ ] ; Joint swelling [ ] ; Back Pain [ ] ; Rash [ ]   Psych: Depression[ ] ; Anxiety[ ]   Heme: Bleeding problems [ ] ; Clotting disorders [ ] ; Anemia [ ]   Endocrine: Diabetes [ Y]; Thyroid dysfunction[ ]   Home Medications Prior to Admission medications   Medication Sig Start Date End Date Taking? Authorizing Provider  albuterol (PROVENTIL HFA;VENTOLIN HFA) 108 (90 Base) MCG/ACT inhaler Inhale 2 puffs into the lungs every 4 (four) hours as needed for wheezing or shortness of breath. 11/27/16  Yes [name], MD  albuterol (PROVENTIL) (2.5 MG/3ML) 0.083% nebulizer solution Take 3 mLs (2.5 mg total) by nebulization every 4 (four) hours. And as needed 11/27/16  Yes [name], MD  apixaban (ELIQUIS) 2.5 MG TABS tablet Take 2.5 mg by mouth 2 (two) times daily.   Yes Historical Provider, MD  ascorbic acid (VITAMIN C) 1000 MG tablet Take 1,000 mg by mouth daily.   Yes Historical Provider, MD  aspirin EC 81 MG tablet Take 81 mg by mouth daily.   Yes Historical Provider, MD  atorvastatin (LIPITOR) 40  MG tablet Take 40 mg by mouth daily.   Yes Historical Provider, MD  benzonatate (TESSALON) 100 MG capsule Take by mouth 3 (three) times daily as needed for cough.   Yes Historical Provider, MD  carvedilol (COREG) 6.25 MG tablet Take 6.25 mg by mouth 2 (two) times daily with a meal.   Yes Historical Provider, MD  fluticasone (FLONASE) 50 MCG/ACT nasal spray Place into both nostrils daily.   Yes Historical Provider, MD  fluticasone (FLOVENT HFA) 110 MCG/ACT inhaler Inhale 2 puffs into the lungs 2 (two) times daily. 11/27/16  Yes [name], MD  hydrALAZINE (APRESOLINE) 25 MG tablet Take 25 mg by mouth 3 (three) times daily.    Yes Historical Provider, MD  insulin glargine (LANTUS) 100 UNIT/ML injection Inject 20 Units into the skin at bedtime.   Yes Historical Provider, MD  isosorbide dinitrate (ISORDIL) 30 MG tablet Take 15 mg by mouth 2 (two) times daily.   Yes Historical Provider, MD  isosorbide mononitrate (IMDUR) 30 MG 24 hr tablet Take 30 mg by mouth daily.   Yes Historical Provider, MD  levothyroxine (SYNTHROID, LEVOTHROID) 25 MCG tablet Take 25 mcg by mouth daily before breakfast.   Yes Historical Provider, MD  Multiple Vitamin (MULTIVITAMIN) tablet Take 1 tablet by mouth daily.   Yes Historical Provider, MD  potassium chloride SA (K-DUR,KLOR-CON) 20 MEQ tablet Take 20 mEq by mouth 2 (two) times daily.   Yes Historical Provider, MD  sacubitril-valsartan (ENTRESTO) 24-26 MG Take 1 tablet by mouth 2 (two) times daily.   Yes Historical Provider, MD  Tiotropium Bromide-Olodaterol (STIOLTO RESPIMAT) 2.5-2.5 MCG/ACT AERS Inhale 2 puffs into the lungs daily.   Yes Historical Provider, MD  torsemide (DEMADEX) 20 MG tablet Take 40 mg by mouth daily.   Yes Historical Provider, MD  traMADol (ULTRAM) 50 MG tablet Take 50 mg by mouth every 6 (six) hours as needed for moderate pain.    Yes Historical Provider, MD    Past Medical History: Past Medical History:  Diagnosis Date  . Asthma   . CHF (congestive heart failure) ([place])   . Diabetes ([place])   . Kidney disease   . Sleep apnea     Past Surgical History: History reviewed. No pertinent surgical history.  Family History: Family History  Problem Relation Age of Onset  . Heart failure Father   . Heart attack Father     Social History: Social History   Social History  . Marital status: Widowed    Spouse name: N/A  . Number of children: N/A  . Years of education: N/A   Social History Main Topics  . Smoking status: Never Smoker  . Smokeless tobacco: Never Used  . Alcohol use No  . Drug use: No  . Sexual activity: Not Asked   Other Topics Concern  .  None   Social History Narrative   Patient is a widow. Moved to [place] from [place], [place]. Currently lives with his sister Dshon Orick.     Allergies:  No Known Allergies  Objective:    Vital Signs:   Temp:  [97.8 F (36.6 C)] 97.8 F (36.6 C) (01/24 2316) Pulse Rate:  [39-89] 48 (01/25 0800) Resp:  [9-26] 11 (01/25 0800) BP: (81-154)/(48-137) 92/48 (01/25 0800) SpO2:  [95 %-100 %] 98 % (01/25 0800) Weight:  [206 lb 9.1 oz (93.7 kg)] 206 lb 9.1 oz (93.7 kg) (01/24 2321)    Weight change: Filed Weights   11/27/16 2321  Weight: 206  lb 9.1 oz (93.7 kg)    Intake/Output:   Intake/Output Summary (Last 24 hours) at 11/28/16 1056 Last data filed at 11/28/16 0615  Gross per 24 hour  Intake              100 ml  Output              200 ml  Net             -100 ml     Physical Exam: General:  Chronically ill appearing . No resp difficulty HEENT: normal Neck: supple. JVP to jaw . Carotids 2+ bilat; no bruits. No lymphadenopathy or thryomegaly appreciated. Cor: PMI nondisplaced. Irregular rate & rhythm. 2/6 HSM apex, 2/6 SEM RUSB. Lungs: R and LLL crackles  Abdomen: soft, nontender, nondistended. No hepatosplenomegaly. No bruits or masses. Good bowel sounds. Extremities: no cyanosis, clubbing, rash, R and LLE + edema Neuro: alert & orientedx3, cranial nerves grossly intact. moves all 4 extremities w/o difficulty. Affect pleasant Skin: R and LLE feet red rash   Telemetry: A fib RBBB 70s with NSVT   Labs: Basic Metabolic Panel:  Recent Labs Lab 11/27/16 1207 11/27/16 2321  NA 135 135  K 4.6 4.9  CL 98 98*  CO2 28 27  GLUCOSE 78 67  BUN 97* 99*  CREATININE 2.29* 2.39*  CALCIUM 9.3 9.2    Liver Function Tests: No results for input(s): AST, ALT, ALKPHOS, BILITOT, PROT, ALBUMIN in the last 168 hours. No results for input(s): LIPASE, AMYLASE in the last 168 hours. No results for input(s): AMMONIA in the last 168 hours.  CBC:  Recent Labs Lab  11/27/16 2321  WBC 7.6  HGB 8.9*  HCT 27.1*  MCV 94.8  PLT 136*    Cardiac Enzymes: No results for input(s): CKTOTAL, CKMB, CKMBINDEX, TROPONINI in the last 168 hours.  BNP: BNP (last 3 results) No results for input(s): BNP in the last 8760 hours.  ProBNP (last 3 results)  Recent Labs  11/27/16 1207  PROBNP 1,746.0*     CBG:  Recent Labs Lab 11/28/16 0604 11/28/16 0935  GLUCAP 77 72    Coagulation Studies: No results for input(s): LABPROT, INR in the last 72 hours.  Other results: EKG:A fib RBBB   Imaging: Dg Chest 2 View  Result Date: 11/27/2016 CLINICAL DATA:  71 year old male with shortness of breath EXAM: CHEST  2 VIEW COMPARISON:  None FINDINGS: Minimal left lung base atelectatic changes noted. There is no focal consolidation, pleural effusion, or pneumothorax. There is mild cardiomegaly. Median sternotomy wires, CABG surgical clips, and mechanical cardiac bowel noted. There is a right pectoral AICD device. No acute osseous pathology. IMPRESSION: No acute cardiopulmonary process. Cardiomegaly. Electronically Signed   By: Anner Crete M.D.   On: 11/27/2016 23:56   US Renal  Result Date: 11/28/2016 CLINICAL DATA:  Initial evaluation for acute renal injury, weakness, lower extremity swelling. History of CHF/ kidney disease. EXAM: RENAL / URINARY TRACT ULTRASOUND COMPLETE COMPARISON:  None. FINDINGS: Right Kidney: Length: 12.9 cm. Mildly increased echogenicity. Somewhat lobulated contour. No mass or hydronephrosis visualized. Left Kidney: Length: 12.2 cm. Mildly increased echogenicity. Somewhat lobulated contour. No mass or hydronephrosis visualized. Bladder: Appears normal for degree of bladder distention. Small volume ascites noted. IMPRESSION: 1. Mildly increased echogenicity within the renal parenchyma bilaterally, suggesting medical renal disease. No hydronephrosis. 2. Ascites. Electronically Signed   By: Jeannine Boga M.D.   On: 11/28/2016 03:43       Medications:  Current Medications: . albuterol  2.5 mg Nebulization Q4H  . apixaban  2.5 mg Oral BID  . arformoterol  15 mcg Nebulization BID  . aspirin EC  81 mg Oral Daily  . atorvastatin  40 mg Oral Daily  . budesonide (PULMICORT) nebulizer solution  0.5 mg Nebulization BID  . carvedilol  6.25 mg Oral BID WC  . fluticasone  1 spray Each Nare Daily  . insulin aspart  0-9 Units Subcutaneous TID WC  . insulin glargine  10 Units Subcutaneous QHS  . isosorbide mononitrate  30 mg Oral Daily  . levothyroxine  25 mcg Oral QAC breakfast  . multivitamin with minerals  1 tablet Oral Daily  . umeclidinium bromide  1 puff Inhalation Daily     Infusions:    Assessment/Plan/Discussion   1. A/C Systolic Heart Failure  NYHA IV. Marked volume overload. Due to hypotension will need to placed line and likely add pressors. With h/o CKD will need high dose lasix. Start 120 mg lasix now then 80 mg IV twice a day. Start milrinone 0.25 mcg and norepi 3 mcg.  Check CO-OX once line placed. Check CVP.   Hold BB/entresto for now. ECHO now.   2.  A fib controlled rate- Controlled rate. Hold eliquis until line placed.  3. CKD Stage IV - stop entresto  4. Anemia- Check Iron stores.  5. OSA- CPAP at night.  6. CAD- H/O CABG 2010 7. NSVT - Keep K >4 and Mag >2.  Length of Stay: 0  Amy Clegg NP-C  11/28/2016, 10:56 AM  Advanced Heart Failure Team Pager [phone] (M-F; 7a - 4p)  Please contact [facility] for night-coverage after hours (4p -7a ) and weekends on [email or web address]  Patient seen with NP, agree with the above note.  He has had trouble recently with recurrent episodes of volume overload and renal failure.  Was just discharged from hospital in [place] on 11/12/16, since then has gained weight and become steadily short of breath.  Currently SBP 80s-90s, comfortable at rest.  He is markedly volume overloaded.  1. Acute on chronic systolic CHF: Ischemic cardiomyopathy.  EF 30-35% on  last echo done in [place].  He is markedly volume overloaded on exam today with soft blood pressure.  BUN/creatinine elevated.  I am concerned that he has low output failure.  - Place central line now.  Will get co-ox and likely start combination of milrinone/norepinephrine.  Follow CVP/co-ox via CVL.  - Lasix 120 mg IV after gtts started, then 80 mg IV bid.  - Hold Entresto, Coreg, hydralazine/Imdur.  - Echo now.  2. CAD: s/p CABG.  No chest pain. Continue statin.  3. AKI on CKD stage III: Suspect cardiorenal syndrome in setting of low output failure.  Hopefully will improve with inotropes/pressors.   4. Atrial fibrillation: Chronic.  Rate is not elevated.  Hold apixaban for line.  5. S/p TAVR: To get echo.   Admit to CCU  Loralie Champagne 11/28/2016 12:21 PM

## 2016-11-28 NOTE — Progress Notes (Signed)
   CO-OX on milrinone 0.25 mcg + norepi 3 mcg 56%. CVP 12. BP stable.   Repeat CO-OX in the morning.    Serine Kea NP-C  3:55 PM

## 2016-11-28 NOTE — Progress Notes (Signed)
Patient had a 30 beat run of Vtach. Patient asymptomatic. Cards paged and notified. Will continue to monitor patient.

## 2016-11-28 NOTE — ED Notes (Signed)
Cards PA at bedside, will change bed request to ICU

## 2016-11-28 NOTE — Procedures (Signed)
Central Venous Catheter Insertion Procedure Note Trevor [name] [id number] [birth year]  Procedure: Insertion of Central Venous Catheter Indications: Assessment of intravascular volume, Drug and/or fluid administration and Frequent blood sampling  Procedure Details Consent: Risks of procedure as well as the alternatives and risks of each were explained to the (patient/caregiver).  Consent for procedure obtained. Time Out: Verified patient identification, verified procedure, site/side was marked, verified correct patient position, special equipment/implants available, medications/allergies/relevent history reviewed, required imaging and test results available.  Performed  Maximum sterile technique was used including antiseptics, cap, gloves, gown, hand hygiene, mask and sheet. Skin prep: Chlorhexidine; local anesthetic administered A antimicrobial bonded/coated triple lumen catheter was placed in the right internal jugular vein using the Seldinger technique.  Evaluation Blood flow good Complications: No apparent complications Patient did tolerate procedure well. Chest X-ray ordered to verify placement.  CXR: pending.  Hayden Pedro, AG-ACNP [facility]  Pgr: [phone]  PCCM Pgr: [phone]

## 2016-11-28 NOTE — Progress Notes (Signed)
Patient has refused CPAP for tonight. Patient states he will try and wear one tomorrow night. Patient in no distress on RA.

## 2016-11-28 NOTE — H&P (Addendum)
History and Physical    Trevor [name] [id number] DOB: August 09, 1946 DOA: 11/27/2016   PCP: No PCP Per Patient Chief Complaint:  Chief Complaint  Patient presents with  . Chest Pain  . Leg Swelling    HPI: Trevor [name] is a 71 y.o. male with medical history significant of CHF, CKD, DM2, asthma.  Patient was just discharged earlier this month from [name] in [place], [place] after an admit for "cardiorenal syndrome".  He apparently had renal recovery (much to the surprise of his doctors apparently).  After discharge, due to planning on moving to the area here in [place] he was to establish care with [name] at [facility] actually later today at noon in the office.  Patient presents to the ED with c/o worsening leg edema and SOB over the last 24 hours.  Did double his dose of torsemide at home today to 80mg  but hasnt seen much benefit or much UOP at all.    ED Course: Symptoms were severe initially on arrival.  Seem to have improved somewhat spontaneously in ED.  Review of Systems: As per HPI otherwise 10 point review of systems negative.    Past Medical History:  Diagnosis Date  . Asthma   . CHF (congestive heart failure) ([place])   . Diabetes ([place])   . Kidney disease   . Sleep apnea     History reviewed. No pertinent surgical history.   reports that he has never smoked. He has never used smokeless tobacco. He reports that he does not drink alcohol or use drugs.  No Known Allergies  Family History  Problem Relation Age of Onset  . Heart failure Father   . Heart attack Father      Prior to Admission medications   Medication Sig Start Date End Date Taking? Authorizing Provider  albuterol (PROVENTIL HFA;VENTOLIN HFA) 108 (90 Base) MCG/ACT inhaler Inhale 2 puffs into the lungs every 4 (four) hours as needed for wheezing or shortness of breath. 11/27/16  Yes [name], MD  albuterol (PROVENTIL) (2.5 MG/3ML) 0.083%  nebulizer solution Take 3 mLs (2.5 mg total) by nebulization every 4 (four) hours. And as needed 11/27/16  Yes [name], MD  apixaban (ELIQUIS) 2.5 MG TABS tablet Take 2.5 mg by mouth 2 (two) times daily.   Yes Historical Provider, MD  ascorbic acid (VITAMIN C) 1000 MG tablet Take 1,000 mg by mouth daily.   Yes Historical Provider, MD  aspirin EC 81 MG tablet Take 81 mg by mouth daily.   Yes Historical Provider, MD  atorvastatin (LIPITOR) 40 MG tablet Take 40 mg by mouth daily.   Yes Historical Provider, MD  benzonatate (TESSALON) 100 MG capsule Take by mouth 3 (three) times daily as needed for cough.   Yes Historical Provider, MD  carvedilol (COREG) 6.25 MG tablet Take 6.25 mg by mouth 2 (two) times daily with a meal.   Yes Historical Provider, MD  fluticasone (FLONASE) 50 MCG/ACT nasal spray Place into both nostrils daily.   Yes Historical Provider, MD  fluticasone (FLOVENT HFA) 110 MCG/ACT inhaler Inhale 2 puffs into the lungs 2 (two) times daily. 11/27/16  Yes [name], MD  hydrALAZINE (APRESOLINE) 25 MG tablet Take 25 mg by mouth 3 (three) times daily.   Yes Historical Provider, MD  insulin glargine (LANTUS) 100 UNIT/ML injection Inject 20 Units into the skin at bedtime.   Yes Historical Provider, MD  isosorbide dinitrate (ISORDIL) 30 MG  tablet Take 15 mg by mouth 2 (two) times daily.   Yes Historical Provider, MD  isosorbide mononitrate (IMDUR) 30 MG 24 hr tablet Take 30 mg by mouth daily.   Yes Historical Provider, MD  levothyroxine (SYNTHROID, LEVOTHROID) 25 MCG tablet Take 25 mcg by mouth daily before breakfast.   Yes Historical Provider, MD  Multiple Vitamin (MULTIVITAMIN) tablet Take 1 tablet by mouth daily.   Yes Historical Provider, MD  potassium chloride SA (K-DUR,KLOR-CON) 20 MEQ tablet Take 20 mEq by mouth 2 (two) times daily.   Yes Historical Provider, MD  sacubitril-valsartan (ENTRESTO) 24-26 MG Take 1 tablet by mouth 2 (two) times daily.   Yes Historical Provider, MD    Tiotropium Bromide-Olodaterol (STIOLTO RESPIMAT) 2.5-2.5 MCG/ACT AERS Inhale 2 puffs into the lungs daily.   Yes Historical Provider, MD  torsemide (DEMADEX) 20 MG tablet Take 40 mg by mouth daily.   Yes Historical Provider, MD  traMADol (ULTRAM) 50 MG tablet Take 50 mg by mouth every 6 (six) hours as needed for moderate pain.    Yes Historical Provider, MD    Physical Exam: Vitals:   11/28/16 0045 11/28/16 0100 11/28/16 0115 11/28/16 0130  BP: 103/70 101/83 101/67 94/60  Pulse: (!) 56 (!) 58 79 (!) 49  Resp: 16 21 12 12   Temp:      SpO2: 96% 97% 97% 95%  Weight:      Height:          Constitutional: NAD, calm, comfortable Eyes: PERRL, lids and conjunctivae normal ENMT: Mucous membranes are moist. Posterior pharynx clear of any exudate or lesions.Normal dentition.  Neck: normal, supple, no masses, no thyromegaly Respiratory: clear to auscultation bilaterally, no wheezing, no crackles. Normal respiratory effort. No accessory muscle use.  Cardiovascular: Irr, irr, 3+ BLE edema Abdomen: no tenderness, no masses palpated. No hepatosplenomegaly. Bowel sounds positive.  Musculoskeletal: no clubbing / cyanosis. No joint deformity upper and lower extremities. Good ROM, no contractures. Normal muscle tone.  Skin: no rashes, lesions, ulcers. No induration Neurologic: CN 2-12 grossly intact. Sensation intact, DTR normal. Strength 5/5 in all 4.  Psychiatric: Normal judgment and insight. Alert and oriented x 3. Normal mood.    Labs on Admission: I have personally reviewed following labs and imaging studies  CBC:  Recent Labs Lab 11/27/16 2321  WBC 7.6  HGB 8.9*  HCT 27.1*  MCV 94.8  PLT [id number]*   Basic Metabolic Panel:  Recent Labs Lab 11/27/16 1207 11/27/16 2321  NA 135 135  K 4.6 4.9  CL 98 98*  CO2 28 27  GLUCOSE 78 67  BUN 97* 99*  CREATININE 2.29* 2.39*  CALCIUM 9.3 9.2   GFR: Estimated Creatinine Clearance: 29.8 mL/min (by C-G formula based on SCr of 2.39 mg/dL  (H)). Liver Function Tests: No results for input(s): AST, ALT, ALKPHOS, BILITOT, PROT, ALBUMIN in the last 168 hours. No results for input(s): LIPASE, AMYLASE in the last 168 hours. No results for input(s): AMMONIA in the last 168 hours. Coagulation Profile: No results for input(s): INR, PROTIME in the last 168 hours. Cardiac Enzymes: No results for input(s): CKTOTAL, CKMB, CKMBINDEX, TROPONINI in the last 168 hours. BNP (last 3 results)  Recent Labs  11/27/16 1207  PROBNP 1,746.0*   HbA1C: No results for input(s): HGBA1C in the last 72 hours. CBG: No results for input(s): GLUCAP in the last 168 hours. Lipid Profile: No results for input(s): CHOL, HDL, LDLCALC, TRIG, CHOLHDL, LDLDIRECT in the last 72 hours. Thyroid Function Tests: No  results for input(s): TSH, T4TOTAL, FREET4, T3FREE, THYROIDAB in the last 72 hours. Anemia Panel: No results for input(s): VITAMINB12, FOLATE, FERRITIN, TIBC, IRON, RETICCTPCT in the last 72 hours. Urine analysis: No results found for: COLORURINE, APPEARANCEUR, LABSPEC, PHURINE, GLUCOSEU, HGBUR, BILIRUBINUR, KETONESUR, PROTEINUR, UROBILINOGEN, NITRITE, LEUKOCYTESUR Sepsis Labs: @LABRCNTIP (procalcitonin:4,lacticidven:4) )No results found for this or any previous visit (from the past 240 hour(s)).   Radiological Exams on Admission: Dg Chest 2 View  Result Date: 11/27/2016 CLINICAL DATA:  71 year old male with shortness of breath EXAM: CHEST  2 VIEW COMPARISON:  None FINDINGS: Minimal left lung base atelectatic changes noted. There is no focal consolidation, pleural effusion, or pneumothorax. There is mild cardiomegaly. Median sternotomy wires, CABG surgical clips, and mechanical cardiac bowel noted. There is a right pectoral AICD device. No acute osseous pathology. IMPRESSION: No acute cardiopulmonary process. Cardiomegaly. Electronically Signed   By: Anner Crete M.D.   On: 11/27/2016 23:56    EKG: Independently  reviewed.  Assessment/Plan Principal Problem:   Cardiorenal syndrome with renal failure Active Problems:   Cardiomyopathy, ischemic   Asthma, chronic   Acute on chronic systolic CHF (congestive heart failure) ([facility])   Acute on chronic kidney failure ([place])    1. Cardiorenal syndrome with renal failure -  1. Hold ARB 2. Hold diuretic to "gently hydrate" patient 1. Since BP running in the [id number] systolic in the ED and pre-renal appearance, will go ahead and do 250 cc NS bolus x1, repeat x1 if no improvement after first bolus. 3. Will also continue to hold hydralazine which he has been holding at home as BPs have been running low anyhow. 4. FENa and UA ordered 5. Needs CHF team consult in AM for management 6. Unknown baseline, but presumably he has AKI as his BUN is nearly 100 today on labwork! 1. If this is in-fact his baseline, then he definitely needs nephrology consult 2. Sending for records from [place]. 2. Acute on Chronic systolic CHF - with worsening edema and SOB on presentation 1. No pulmonary edema being called and patient able to sit at 45 degree angle, so given issue #1 above will actually hold the patients diuretic for the moment 2. CHF pathway 3. Daily BMPs 4. Strict intake and output 5. Getting 2d echo so we have one on record here, EF known to be 30% per patient. 6. Call [facility] consult in AM 3. Asthma - continue home nebs   DVT prophylaxis: Eliquis Code Status: Full Family Communication: No family in room Consults called: None, call cards in AM Admission status: Admit to inpatient   Etta Quill DO [facility] Pager [phone] from 7PM-7AM  If 7AM-7PM, please contact the day physician for the patient [email or web address] Password [email or web address]  11/28/2016, 3:18 AM

## 2016-11-28 NOTE — ED Provider Notes (Signed)
[facility] DEPT Provider Note   CSN: [id number] Arrival date & time: 11/27/16  2312     History   Chief Complaint Chief Complaint  Patient presents with  . Chest Pain  . Leg Swelling    HPI Trevor [name] is a 71 y.o. male.  Patient with a history of CHF with recent admission for same, CAD, asthma, OSA, T2DM, renal disorder presents with increased bilateral LE edema over the last 24 hours. He is taking Demadex at double his usual dose without relief. He feels his chest is "tight" but no overt chest pain. No fever or cough, nausea or vomiting. He does not know his weight gain. He was seen by Dr. Malvin Johns yesterday (11/27/16) and was scheduled to see the cardiologist later today (11/28/16) but did not feel he could wait to be seen. He is having SOB on exertion, pain in legs with rapid swelling, prompting his presentation to the ED.    The history is provided by the patient. No language interpreter was used.  Chest Pain   Associated symptoms include shortness of breath and weakness (generalized). Pertinent negatives include no abdominal pain, no cough, no fever and no vomiting.    Past Medical History:  Diagnosis Date  . Asthma   . CHF (congestive heart failure) ([place])   . Diabetes ([place])   . Kidney disease   . Sleep apnea     Patient Active Problem List   Diagnosis Date Noted  . Cardiomyopathy, ischemic 11/27/2016  . Coronary artery disease 11/27/2016  . Hx of CABG 11/27/2016  . Status post aortic valve replacement 11/27/2016  . Asthma, chronic 11/27/2016  . OSA (obstructive sleep apnea) 11/27/2016  . Chronic renal insufficiency 11/27/2016    History reviewed. No pertinent surgical history.     Home Medications    Prior to Admission medications   Medication Sig Start Date End Date Taking? Authorizing Provider  albuterol (PROVENTIL HFA;VENTOLIN HFA) 108 (90 Base) MCG/ACT inhaler Inhale 2 puffs into the lungs every 4 (four) hours as needed for wheezing or  shortness of breath. 11/27/16  Yes [name], MD  albuterol (PROVENTIL) (2.5 MG/3ML) 0.083% nebulizer solution Take 3 mLs (2.5 mg total) by nebulization every 4 (four) hours. And as needed 11/27/16  Yes [name], MD  apixaban (ELIQUIS) 2.5 MG TABS tablet Take 2.5 mg by mouth 2 (two) times daily.   Yes Historical Provider, MD  ascorbic acid (VITAMIN C) 1000 MG tablet Take 1,000 mg by mouth daily.   Yes Historical Provider, MD  aspirin EC 81 MG tablet Take 81 mg by mouth daily.   Yes Historical Provider, MD  atorvastatin (LIPITOR) 40 MG tablet Take 40 mg by mouth daily.   Yes Historical Provider, MD  benzonatate (TESSALON) 100 MG capsule Take by mouth 3 (three) times daily as needed for cough.   Yes Historical Provider, MD  carvedilol (COREG) 6.25 MG tablet Take 6.25 mg by mouth 2 (two) times daily with a meal.   Yes Historical Provider, MD  fluticasone (FLONASE) 50 MCG/ACT nasal spray Place into both nostrils daily.   Yes Historical Provider, MD  fluticasone (FLOVENT HFA) 110 MCG/ACT inhaler Inhale 2 puffs into the lungs 2 (two) times daily. 11/27/16  Yes [name], MD  hydrALAZINE (APRESOLINE) 25 MG tablet Take 25 mg by mouth 3 (three) times daily.   Yes Historical Provider, MD  insulin glargine (LANTUS) 100 UNIT/ML injection Inject 20 Units into the skin at bedtime.   Yes Historical  Provider, MD  insulin lispro (HUMALOG) 100 UNIT/ML injection Inject 50 Units into the skin 3 (three) times daily before meals.   Yes Historical Provider, MD  isosorbide dinitrate (ISORDIL) 30 MG tablet Take 15 mg by mouth 2 (two) times daily.   Yes Historical Provider, MD  isosorbide mononitrate (IMDUR) 30 MG 24 hr tablet Take 30 mg by mouth daily.   Yes Historical Provider, MD  levothyroxine (SYNTHROID, LEVOTHROID) 25 MCG tablet Take 25 mcg by mouth daily before breakfast.   Yes Historical Provider, MD  Multiple Vitamin (MULTIVITAMIN) tablet Take 1 tablet by mouth daily.   Yes Historical Provider, MD    potassium chloride SA (K-DUR,KLOR-CON) 20 MEQ tablet Take 20 mEq by mouth 2 (two) times daily.   Yes Historical Provider, MD  sacubitril-valsartan (ENTRESTO) 24-26 MG Take 1 tablet by mouth 2 (two) times daily.   Yes Historical Provider, MD  Tiotropium Bromide-Olodaterol (STIOLTO RESPIMAT) 2.5-2.5 MCG/ACT AERS Inhale 2 puffs into the lungs daily.   Yes Historical Provider, MD  torsemide (DEMADEX) 20 MG tablet Take 40 mg by mouth daily.   Yes Historical Provider, MD  traMADol (ULTRAM) 50 MG tablet Take 50 mg by mouth every 6 (six) hours as needed for moderate pain.    Yes Historical Provider, MD    Family History History reviewed. No pertinent family history.  Social History Social History  Substance Use Topics  . Smoking status: Never Smoker  . Smokeless tobacco: Never Used  . Alcohol use No     Allergies   Patient has no known allergies.   Review of Systems Review of Systems  Constitutional: Negative for chills and fever.  HENT: Negative.   Respiratory: Positive for shortness of breath. Negative for cough.   Cardiovascular: Positive for chest pain and leg swelling.  Gastrointestinal: Negative.  Negative for abdominal pain and vomiting.  Musculoskeletal: Negative.  Negative for myalgias.       Bilateral LE pain.  Skin: Negative.  Negative for color change.  Neurological: Positive for weakness (generalized). Negative for light-headedness.     Physical Exam Updated Vital Signs BP 97/67   Pulse 86   Temp 97.8 F (36.6 C)   Resp 23   Ht 5\' 5"  (1.651 m)   Wt 93.7 kg   SpO2 100%   BMI 34.38 kg/m   Physical Exam  Constitutional: He is oriented to person, place, and time. He appears well-developed and well-nourished.  HENT:  Head: Normocephalic.  Neck: Normal range of motion. Neck supple.  Cardiovascular: Normal rate and regular rhythm.   Murmur heard. Pulmonary/Chest: Effort normal. No respiratory distress. He has rales (Bilateral bases).  Abdominal: Soft. Bowel  sounds are normal. There is no tenderness. There is no rebound and no guarding.  Musculoskeletal: Normal range of motion.  2-3+ pitting edema in bilateral LE.  Neurological: He is alert and oriented to person, place, and time.  Skin: Skin is warm and dry. No rash noted.  Psychiatric: He has a normal mood and affect.     ED Treatments / Results  Labs (all labs ordered are listed, but only abnormal results are displayed) Labs Reviewed  BASIC METABOLIC PANEL - Abnormal; Notable for the following:       Result Value   Chloride 98 (*)    BUN 99 (*)    Creatinine, Ser 2.39 (*)    GFR calc non Af Amer 26 (*)    GFR calc Af Amer 30 (*)    All other components within normal  limits  CBC - Abnormal; Notable for the following:    RBC 2.86 (*)    Hemoglobin 8.9 (*)    HCT 27.1 (*)    RDW 17.4 (*)    Platelets 136 (*)    All other components within normal limits  I-STAT TROPOININ, ED    EKG  EKG Interpretation  Date/Time:  Wednesday November 27 2016 23:14:47 EST Ventricular Rate:  69 PR Interval:    QRS Duration: 199 QT Interval:  439 QTC Calculation: 471 R Axis:   -103 Text Interpretation:  Sinus rhythm Multiple premature complexes, vent & supraven RBBB and LAFB No old tracing to compare Confirmed by [facility]  MD, DAVID ([id number]) on 11/27/2016 11:27:06 PM       Radiology Dg Chest 2 View  Result Date: 11/27/2016 CLINICAL DATA:  71 year old male with shortness of breath EXAM: CHEST  2 VIEW COMPARISON:  None FINDINGS: Minimal left lung base atelectatic changes noted. There is no focal consolidation, pleural effusion, or pneumothorax. There is mild cardiomegaly. Median sternotomy wires, CABG surgical clips, and mechanical cardiac bowel noted. There is a right pectoral AICD device. No acute osseous pathology. IMPRESSION: No acute cardiopulmonary process. Cardiomegaly. Electronically Signed   By: Anner Crete M.D.   On: 11/27/2016 23:56    Procedures Procedures (including critical care  time)  Medications Ordered in ED Medications  oxymetazoline (AFRIN) 0.05 % nasal spray 1 spray (not administered)  morphine 4 MG/ML injection 4 mg (not administered)     Initial Impression / Assessment and Plan / ED Course  I have reviewed the triage vital signs and the nursing notes.  Pertinent labs & imaging results that were available during my care of the patient were reviewed by me and considered in my medical decision making (see chart for details).     Patient presents with concern for exacerbated CHF, similar to recent admission while in [place] prior to moving to [place]. He has significant LE swelling and is experiencing SOB and discomfort.   His labs are c/w known CKD, however, baselilne Cr is not known and is not available in care Everywhere. CXR does not show significant congestion. He is felt to require admission for treatment of acute on chronic CHF. Discussed with [facility] who accepts the patient onto their service.   Final Clinical Impressions(s) / ED Diagnoses   Final diagnoses:  None   1. CHF 2. CKD  New Prescriptions New Prescriptions   No medications on file     Dennie Bible [id number]    Delora Fuel, MD [id number]    Delora Fuel, MD [id number]

## 2016-11-29 LAB — BASIC METABOLIC PANEL
Anion gap: 10 (ref 5–15)
Anion gap: 9 (ref 5–15)
BUN: 82 mg/dL — ABNORMAL HIGH (ref 6–20)
BUN: 74 mg/dL — AB (ref 6–20)
CHLORIDE: 100 mmol/L — AB (ref 101–111)
CO2: 28 mmol/L (ref 22–32)
CO2: 28 mmol/L (ref 22–32)
CREATININE: 1.77 mg/dL — AB (ref 0.61–1.24)
Calcium: 8.8 mg/dL — ABNORMAL LOW (ref 8.9–10.3)
Calcium: 8.9 mg/dL (ref 8.9–10.3)
Chloride: 99 mmol/L — ABNORMAL LOW (ref 101–111)
Creatinine, Ser: 1.81 mg/dL — ABNORMAL HIGH (ref 0.61–1.24)
GFR calc Af Amer: 43 mL/min — ABNORMAL LOW (ref >60)
GFR calc non Af Amer: 36 mL/min — ABNORMAL LOW (ref >60)
GFR calc non Af Amer: 37 mL/min — ABNORMAL LOW (ref >60)
GFR, EST AFRICAN AMERICAN: 42 mL/min — AB (ref >60)
GLUCOSE: 229 mg/dL — AB (ref 65–99)
Glucose, Bld: 147 mg/dL — ABNORMAL HIGH (ref 65–99)
POTASSIUM: 3.7 mmol/L (ref 3.5–5.1)
Potassium: 4.2 mmol/L (ref 3.5–5.1)
SODIUM: 138 mmol/L (ref 135–145)
SODIUM: 136 mmol/L (ref 135–145)

## 2016-11-29 LAB — CBC
HCT: 26 % — ABNORMAL LOW (ref 39.0–52.0)
HEMOGLOBIN: 8.6 g/dL — AB (ref 13.0–17.0)
MCH: 31.5 pg (ref 26.0–34.0)
MCHC: 33.1 g/dL (ref 30.0–36.0)
MCV: 95.2 fL (ref 78.0–100.0)
Platelets: 118 10*3/uL — ABNORMAL LOW (ref 150–400)
RBC: 2.73 MIL/uL — ABNORMAL LOW (ref 4.22–5.81)
RDW: 17.8 % — AB (ref 11.5–15.5)
WBC: 6.2 10*3/uL (ref 4.0–10.5)

## 2016-11-29 LAB — MAGNESIUM
MAGNESIUM: 1.9 mg/dL (ref 1.7–2.4)
MAGNESIUM: 1.9 mg/dL (ref 1.7–2.4)

## 2016-11-29 LAB — GLUCOSE, CAPILLARY
Glucose-Capillary: 125 mg/dL — ABNORMAL HIGH (ref 65–99)
Glucose-Capillary: 190 mg/dL — ABNORMAL HIGH (ref 65–99)
Glucose-Capillary: 203 mg/dL — ABNORMAL HIGH (ref 65–99)
Glucose-Capillary: 222 mg/dL — ABNORMAL HIGH (ref 65–99)

## 2016-11-29 LAB — COOXEMETRY PANEL
CARBOXYHEMOGLOBIN: 1.7 % — AB (ref 0.5–1.5)
METHEMOGLOBIN: 1 % (ref 0.0–1.5)
O2 SAT: 58.6 %
Total hemoglobin: 8.8 g/dL — ABNORMAL LOW (ref 12.0–16.0)

## 2016-11-29 MED ORDER — MAGNESIUM SULFATE 2 GM/50ML IV SOLN
2.0000 g | Freq: Once | INTRAVENOUS | Status: AC
  Administered 2016-11-29: 2 g via INTRAVENOUS
  Filled 2016-11-29: qty 50

## 2016-11-29 MED ORDER — SODIUM CHLORIDE 0.9% FLUSH: 10.0000 mL | INTRAVENOUS | Status: DC | PRN

## 2016-11-29 MED ORDER — POTASSIUM CHLORIDE CRYS ER 20 MEQ PO TBCR
40.0000 meq | EXTENDED_RELEASE_TABLET | Freq: Once | ORAL | Status: AC
  Administered 2016-11-29: 40 meq via ORAL
  Filled 2016-11-29: qty 2

## 2016-11-29 MED ORDER — METOLAZONE 2.5 MG PO TABS
2.5000 mg | ORAL_TABLET | Freq: Once | ORAL | Status: AC
  Administered 2016-11-29: 2.5 mg via ORAL
  Filled 2016-11-29: qty 1

## 2016-11-29 MED ORDER — AMIODARONE HCL IN DEXTROSE 360-4.14 MG/200ML-% IV SOLN
60.0000 mg/h | INTRAVENOUS | Status: AC
  Administered 2016-11-29 (×2): 60 mg/h via INTRAVENOUS
  Filled 2016-11-29: qty 200

## 2016-11-29 MED ORDER — SODIUM CHLORIDE 0.9% FLUSH
10.0000 mL | Freq: Two times a day (BID) | INTRAVENOUS | Status: DC
  Administered 2016-11-29 – 2016-11-30 (×3): 10 mL
  Administered 2016-11-30: 40 mL
  Administered 2016-12-01 – 2016-12-02 (×3): 10 mL

## 2016-11-29 MED ORDER — AMIODARONE HCL IN DEXTROSE 360-4.14 MG/200ML-% IV SOLN
30.0000 mg/h | INTRAVENOUS | Status: DC
  Administered 2016-11-29 – 2016-12-02 (×7): 30 mg/h via INTRAVENOUS
  Filled 2016-11-29 (×11): qty 200

## 2016-11-29 MED ORDER — FERUMOXYTOL INJECTION 510 MG/17 ML
510.0000 mg | Freq: Once | INTRAVENOUS | Status: AC
  Administered 2016-11-29: 510 mg via INTRAVENOUS
  Filled 2016-11-29 (×2): qty 17

## 2016-11-29 NOTE — Progress Notes (Signed)
  Amiodarone Drug - Drug Interaction Consult Note  Recommendations: MONITOR ELECTROLYTES. Amiodarone is metabolized by the cytochrome P450 system and therefore has the potential to cause many drug interactions. Amiodarone has an average plasma half-life of 50 days (range 20 to 100 days).   There is potential for drug interactions to occur several weeks or months after stopping treatment and the onset of drug interactions may be slow after initiating amiodarone.   []  Statins: Increased risk of myopathy. Simvastatin- restrict dose to 20mg  daily. Other statins: counsel patients to report any muscle pain or weakness immediately.  [x]  Anticoagulants: Amiodarone can increase anticoagulant effect. Consider warfarin dose reduction. Patients should be monitored closely and the dose of anticoagulant altered accordingly, remembering that amiodarone levels take several weeks to stabilize. (ON ELIQUIS)  []  Antiepileptics: Amiodarone can increase plasma concentration of phenytoin, the dose should be reduced. Note that small changes in phenytoin dose can result in large changes in levels. Monitor patient and counsel on signs of toxicity.  []  Beta blockers: increased risk of bradycardia, AV block and myocardial depression. Sotalol - avoid concomitant use.  []   Calcium channel blockers (diltiazem and verapamil): increased risk of bradycardia, AV block and myocardial depression.  []   Cyclosporine: Amiodarone increases levels of cyclosporine. Reduced dose of cyclosporine is recommended.  []  Digoxin dose should be halved when amiodarone is started.  [x]  Diuretics: increased risk of cardiotoxicity if hypokalemia occurs. (ON LASIX)  []  Oral hypoglycemic agents (glyburide, glipizide, glimepiride): increased risk of hypoglycemia. Patient's glucose levels should be monitored closely when initiating amiodarone therapy.   []  Drugs that prolong the QT interval:  Torsades de pointes risk may be increased with  concurrent use - avoid if possible.  Monitor QTc, also keep magnesium/potassium WNL if concurrent therapy can't be avoided. Marland Kitchen Antibiotics: e.g. fluoroquinolones, erythromycin. . Antiarrhythmics: e.g. quinidine, procainamide, disopyramide, sotalol. . Antipsychotics: e.g. phenothiazines, haloperidol.  . Lithium, tricyclic antidepressants, and methadone.  Thank You,  Wynona Neat, PharmD, BCPS 11/29/2016 7:05 AM

## 2016-11-29 NOTE — Progress Notes (Addendum)
Advanced Heart Failure Rounding Note   Subjective:    Admitted with marked volume overload and low output HF. BP soft so he was started on milrinone 0.25 mcg + norepi 3 mcg. Diuresed with IV lasix. Creatinine trending down 2.26>1.8. NSVT over night.   Overall feeling better. Mild dyspnea with exertion.   Echo: EF 30-35%, moderate LV dilation, bioprosthetic aortic valve ok, moderate MR, RV severely dilated with moderate dysfunction, moderate to severe TR, PASP 51 mmHg.   Objective:   Weight Range:  Vital Signs:   Temp:  [97.3 F (36.3 C)-97.5 F (36.4 C)] 97.3 F (36.3 C) (01/26 0425) Pulse Rate:  [25-115] 67 (01/26 0500) Resp:  [10-22] 10 (01/26 0500) BP: (75-123)/(48-91) 106/61 (01/26 0500) SpO2:  [95 %-100 %] 100 % (01/26 0500) Weight:  [201 lb (91.2 kg)-204 lb 5.9 oz (92.7 kg)] 201 lb (91.2 kg) (01/26 0635) Last BM Date: 11/28/16  Weight change: Filed Weights   11/27/16 2321 11/28/16 1500 11/29/16 0635  Weight: 206 lb 9.1 oz (93.7 kg) 204 lb 5.9 oz (92.7 kg) 201 lb (91.2 kg)    Intake/Output:   Intake/Output Summary (Last 24 hours) at 11/29/16 0646 Last data filed at 11/29/16 0500  Gross per 24 hour  Intake           631.94 ml  Output             1350 ml  Net          -718.06 ml     Physical Exam: CVP 11 General:  Well appearing. No resp difficulty. Sitting in the chair  HEENT: normal.  Neck: supple. JVP to jaw  . Carotids 2+ bilat; no bruits. No lymphadenopathy or thryomegaly appreciated. Cor: PMI nondisplaced. Irregular rate & rhythm.2/6 HSM LLSB, 2/6 SEM RUSB.  Lungs: Decreased in the bases  Abdomen: soft, nontender, nondistended. No hepatosplenomegaly. No bruits or masses. Good bowel sounds. Extremities: no cyanosis, clubbing, rash, R and LLE 3+ edema Neuro: alert & orientedx3, cranial nerves grossly intact. moves all 4 extremities w/o difficulty. Affect pleasant  Telemetry:   A fib 70s-> NSVT 30 beats  Labs: Basic Metabolic Panel:  Recent  Labs Lab 11/27/16 1207 11/27/16 2321 11/28/16 1104 11/29/16 0400  NA 135 135 138 138  K 4.6 4.9 3.9 3.7  CL 98 98* 98* 100*  CO2 28 27 28 28   GLUCOSE 78 67 119* 147*  BUN 97* 99* 97* 82*  CREATININE 2.29* 2.39* 2.26* 1.81*  CALCIUM 9.3 9.2 9.0 8.8*  MG  --   --  2.0  --     Liver Function Tests:  Recent Labs Lab 11/28/16 1104  AST 46*  ALT 34  ALKPHOS 119  BILITOT 1.0  PROT 6.5  ALBUMIN 3.5   No results for input(s): LIPASE, AMYLASE in the last 168 hours. No results for input(s): AMMONIA in the last 168 hours.  CBC:  Recent Labs Lab 11/27/16 2321 11/28/16 1104  WBC 7.6 6.2  HGB 8.9* 9.0*  HCT 27.1* 27.8*  MCV 94.8 95.2  PLT 136* 115*    Cardiac Enzymes: No results for input(s): CKTOTAL, CKMB, CKMBINDEX, TROPONINI in the last 168 hours.  BNP: BNP (last 3 results)  Recent Labs  11/28/16 1104  BNP 1,825.3*    ProBNP (last 3 results)  Recent Labs  11/27/16 1207  PROBNP 1,746.0*      Other results:  Imaging: Dg Chest 2 View  Result Date: 11/27/2016 CLINICAL DATA:  71 year old male with shortness of  breath EXAM: CHEST  2 VIEW COMPARISON:  None FINDINGS: Minimal left lung base atelectatic changes noted. There is no focal consolidation, pleural effusion, or pneumothorax. There is mild cardiomegaly. Median sternotomy wires, CABG surgical clips, and mechanical cardiac bowel noted. There is a right pectoral AICD device. No acute osseous pathology. IMPRESSION: No acute cardiopulmonary process. Cardiomegaly. Electronically Signed   By: Anner Crete M.D.   On: 11/27/2016 23:56   US Renal  Result Date: 11/28/2016 CLINICAL DATA:  Initial evaluation for acute renal injury, weakness, lower extremity swelling. History of CHF/ kidney disease. EXAM: RENAL / URINARY TRACT ULTRASOUND COMPLETE COMPARISON:  None. FINDINGS: Right Kidney: Length: 12.9 cm. Mildly increased echogenicity. Somewhat lobulated contour. No mass or hydronephrosis visualized. Left Kidney:  Length: 12.2 cm. Mildly increased echogenicity. Somewhat lobulated contour. No mass or hydronephrosis visualized. Bladder: Appears normal for degree of bladder distention. Small volume ascites noted. IMPRESSION: 1. Mildly increased echogenicity within the renal parenchyma bilaterally, suggesting medical renal disease. No hydronephrosis. 2. Ascites. Electronically Signed   By: Jeannine Boga M.D.   On: 11/28/2016 03:43   Dg Chest Port 1 View  Result Date: 11/28/2016 CLINICAL DATA:  Pt is here with CHF, has a new RT IJ, confirm placement EXAM: PORTABLE CHEST 1 VIEW COMPARISON:  11/27/2016 FINDINGS: Patient's right IJ central line, tip overlying the level of the upper right atrium, approximately 2.0 cm below the junction with the superior vena cava. The heart is enlarged. There is developing opacity in the medial left lung base, now obscuring the hemidiaphragm. No evidence for pneumothorax. Status post median sternotomy. Right-sided AICD lead to the right ventricle. IMPRESSION: 1. Interval placement of right IJ central line, tip to the upper right atrium, approximately 2 point cm below the junction with the superior vena cava. 2. New left lower lobe opacity consistent with atelectasis or infiltrate. Electronically Signed   By: Nolon Nations M.D.   On: 11/28/2016 13:16      Medications:     Scheduled Medications: . albuterol  2.5 mg Nebulization TID  . apixaban  5 mg Oral BID  . arformoterol  15 mcg Nebulization BID  . aspirin EC  81 mg Oral Daily  . atorvastatin  40 mg Oral Daily  . budesonide (PULMICORT) nebulizer solution  0.5 mg Nebulization BID  . fluticasone  1 spray Each Nare Daily  . furosemide  120 mg Intravenous Once  . furosemide  80 mg Intravenous BID  . insulin aspart  0-9 Units Subcutaneous TID WC  . insulin glargine  10 Units Subcutaneous QHS  . levothyroxine  25 mcg Oral QAC breakfast  . multivitamin with minerals  1 tablet Oral Daily  . sodium chloride flush  10-40 mL  Intracatheter Q12H  . umeclidinium bromide  1 puff Inhalation Daily     Infusions: . milrinone 0.25 mcg/kg/min (11/29/16 0500)  . norepinephrine (LEVOPHED) Adult infusion 3 mcg/min (11/29/16 0500)     PRN Medications:  albuterol, sodium chloride flush, traMADol   Assessment/Plan/Discussion    1. A/C Biventricular Heart Failure  Admitted with marked volume overload. ECHO -->EF 30-35% now. RV severely dilated  Remains on norepi 3 mcg + milrinone 0.25 mcg. CO-OX pending. Renal function improving. Continue to diurese with 80 mg IV lasix twice a day.  Hold BB/entresto for now. Add ted hose.  2.  A fib controlled rate- Controlled rate. Continue Eliquis.  3. CKD Stage IV - off entresto. Renal function improved. Creatinine 2.2>1.8   4. Anemia- Iron stores low. Give  feraheme.  5. OSA- CPAP at night.  6. CAD- H/O CABG 2010-  7. NSVT - Keep K >4 and Mag >2. Check Mag. Give 40 meq K now and check BMET at 1300. Over night with NSVT. Add amiodarone drip.   We discussed that he may need home inotropes to support RV but will need to wait and see how things go over next few days. Renal function improved off ARNi and with inotropes. Will need to review ECHO with Dr Aundra Dubin. With reduced RV function not sure LVAD would be an option. Will likely need Palliative Care consult for goals of care in the event he goes home with milrinone.     Length of Stay: 1   Amy Clegg NP-C  11/29/2016, 6:46 AM  Advanced Heart Failure Team Pager [phone] (M-F; [place])  Please contact [facility] for night-coverage after hours (4p -7a ) and weekends on [email or web address]  Patient seen with NP, agree with the above note.  Records from [place] reviewed.  1. Acute on chronic systolic CHF: Ischemic cardiomyopathy but with prominent RV dysfunction on echo and exam.  Medtronic ICD.  Echo this admission shows EF 30-35% with moderately dilated LV and severely dilated/moderately dysfunction RV with moderate to severe TR.  He  was admitted with low output biventricular failure, cardiorenal syndrome, and marked volume overload.  He needs ongoing diuresis, BP stable today.   - Current combination of milrinone + norepinephrine has stabilized BP and creatinine is coming down.  Awaiting co-ox this morning.  - Continue Lasix 80 mg IV bid today and follow diuresis, may need addition of metolazone.   - Hold Entresto, Coreg, hydralazine/Imdur.  - We had a discussion about his prognosis.  He has had a very concerning trajectory recently with worsening renal function and multiple admissions.  He is out of age range for transplant.  We discussed LVAD.  I am concerned, however, that his right heart seems to be failing somewhat out of proportion to the LV.  I will need to review his echo.  At this point, I would like to see him fully diuresed and will see how much improvement we can get in his renal function.  I am concerned that without inotropic support, he will simply re-develop RV failure.  Will plan on formal RHC after we have optimized diuresis.  I told him that he is going to be in the hospital a while most likely.  2. CAD: s/p CABG.  No chest pain. Continue statin.  3. AKI on CKD stage III: Suspect cardiorenal syndrome in setting of low output failure/RV failure.  Renal function improving with inotropy.   4. Atrial fibrillation: Chronic.  Rate is not elevated.  He is on apixaban.   5. 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  6. NSVT: Long run.  Will add amiodarone while we have him on inotropes.   35 minutes critical care time.   Loralie Champagne 11/29/2016 8:53 AM   Will give dose metolazone this afternoon.  Co-ox stable and BP stable .  Loralie Champagne 11/29/2016 2:14 PM

## 2016-11-29 NOTE — Progress Notes (Signed)
Patient refused CPAP HS tonight. Patient states he slept fine last night.

## 2016-11-30 DIAGNOSIS — I482 Chronic atrial fibrillation: Secondary | ICD-10-CM

## 2016-11-30 LAB — BASIC METABOLIC PANEL
Anion gap: 13 (ref 5–15)
BUN: 71 mg/dL — AB (ref 6–20)
CHLORIDE: 95 mmol/L — AB (ref 101–111)
CO2: 27 mmol/L (ref 22–32)
CREATININE: 1.83 mg/dL — AB (ref 0.61–1.24)
Calcium: 9 mg/dL (ref 8.9–10.3)
GFR calc Af Amer: 41 mL/min — ABNORMAL LOW (ref >60)
GFR calc non Af Amer: 35 mL/min — ABNORMAL LOW (ref >60)
Glucose, Bld: 186 mg/dL — ABNORMAL HIGH (ref 65–99)
Potassium: 3.8 mmol/L (ref 3.5–5.1)
SODIUM: 135 mmol/L (ref 135–145)

## 2016-11-30 LAB — GLUCOSE, CAPILLARY
Glucose-Capillary: 171 mg/dL — ABNORMAL HIGH (ref 65–99)
Glucose-Capillary: 200 mg/dL — ABNORMAL HIGH (ref 65–99)
Glucose-Capillary: 162 mg/dL — ABNORMAL HIGH (ref 65–99)

## 2016-11-30 LAB — CBC
HEMATOCRIT: 25.9 % — AB (ref 39.0–52.0)
Hemoglobin: 8.6 g/dL — ABNORMAL LOW (ref 13.0–17.0)
MCH: 31.7 pg (ref 26.0–34.0)
MCHC: 33.2 g/dL (ref 30.0–36.0)
MCV: 95.6 fL (ref 78.0–100.0)
Platelets: 100 10*3/uL — ABNORMAL LOW (ref 150–400)
RBC: 2.71 MIL/uL — AB (ref 4.22–5.81)
RDW: 17.9 % — AB (ref 11.5–15.5)
WBC: 6.6 10*3/uL (ref 4.0–10.5)

## 2016-11-30 LAB — COOXEMETRY PANEL
Carboxyhemoglobin: 2 % — ABNORMAL HIGH (ref 0.5–1.5)
Methemoglobin: 0.9 % (ref 0.0–1.5)
O2 Saturation: 64.4 %
TOTAL HEMOGLOBIN: 8.7 g/dL — AB (ref 12.0–16.0)

## 2016-11-30 MED ORDER — MAGNESIUM SULFATE 2 GM/50ML IV SOLN
2.0000 g | Freq: Once | INTRAVENOUS | Status: AC
  Administered 2016-11-30: 2 g via INTRAVENOUS
  Filled 2016-11-30: qty 50

## 2016-11-30 MED ORDER — POTASSIUM CHLORIDE CRYS ER 20 MEQ PO TBCR
40.0000 meq | EXTENDED_RELEASE_TABLET | Freq: Once | ORAL | Status: AC
  Administered 2016-11-30: 40 meq via ORAL
  Filled 2016-11-30: qty 2

## 2016-11-30 MED ORDER — METOLAZONE 5 MG PO TABS
5.0000 mg | ORAL_TABLET | Freq: Once | ORAL | Status: AC
  Administered 2016-11-30: 5 mg via ORAL
  Filled 2016-11-30: qty 1

## 2016-11-30 NOTE — Progress Notes (Signed)
Advanced Heart Failure Rounding Note   Subjective:    Admitted with marked volume overload and low output HF. BP soft so he was started on milrinone 0.25 mcg + norepi 3 mcg. Diuresed with IV lasix. Creatinine trending down 2.26>1.8->1.8  He has diuresed 6 pounds. Still fatigued. Breathing a little better. On milrinone and norepi. No CP. NSVT quiescent on amio  Echo: EF 30-35%, moderate LV dilation, bioprosthetic aortic valve ok, moderate MR, RV severely dilated with moderate dysfunction, moderate to severe TR, PASP 51 mmHg.   Objective:   Weight Range:  Vital Signs:   Temp:  [97.4 F (36.3 C)-98.5 F (36.9 C)] 97.8 F (36.6 C) (01/27 1200) Pulse Rate:  [45-115] 76 (01/27 1200) Resp:  [0-22] 12 (01/27 1200) BP: (81-128)/(53-91) 111/63 (01/27 1200) SpO2:  [87 %-100 %] 98 % (01/27 1200) Weight:  [90.9 kg (200 lb 4.8 oz)] 90.9 kg (200 lb 4.8 oz) (01/27 0417) Last BM Date: 11/28/16  Weight change: Filed Weights   11/28/16 1500 11/29/16 0635 11/30/16 0417  Weight: 92.7 kg (204 lb 5.9 oz) 91.2 kg (201 lb) 90.9 kg (200 lb 4.8 oz)    Intake/Output:   Intake/Output Summary (Last 24 hours) at 11/30/16 1253 Last data filed at 11/30/16 1200  Gross per 24 hour  Intake          1565.03 ml  Output             2380 ml  Net          -814.97 ml     Physical Exam: CVP 12-13 General:  No resp difficulty. Sitting in the chair  HEENT: normal.  Neck: supple. JVP to jaw  . Carotids 2+ bilat; no bruits. No lymphadenopathy or thryomegaly appreciated. Cor: PMI nondisplaced. Irregular rate & rhythm.2/6 HSM LLSB, 2/6 SEM RUSB.  Lungs: Decreased in the bases  Abdomen: obese soft, nontender, nondistended. No hepatosplenomegaly. No bruits or masses. Good bowel sounds. Extremities: no cyanosis, clubbing, rash, R and LLE 3+ edema Neuro: alert & orientedx3, cranial nerves grossly intact. moves all 4 extremities w/o difficulty. Affect pleasant  Telemetry:   A fib 70s  Labs: Basic Metabolic  Panel:  Recent Labs Lab 11/27/16 2321 11/28/16 1104 11/29/16 0400 11/29/16 0723 11/29/16 1300 11/30/16 0430  NA 135 138 138  --  136 135  K 4.9 3.9 3.7  --  4.2 3.8  CL 98* 98* 100*  --  99* 95*  CO2 27 28 28   --  28 27  GLUCOSE 67 119* 147*  --  229* 186*  BUN 99* 97* 82*  --  74* 71*  CREATININE 2.39* 2.26* 1.81*  --  1.77* 1.83*  CALCIUM 9.2 9.0 8.8*  --  8.9 9.0  MG  --  2.0  --  1.9 1.9  --     Liver Function Tests:  Recent Labs Lab 11/28/16 1104  AST 46*  ALT 34  ALKPHOS 119  BILITOT 1.0  PROT 6.5  ALBUMIN 3.5   No results for input(s): LIPASE, AMYLASE in the last 168 hours. No results for input(s): AMMONIA in the last 168 hours.  CBC:  Recent Labs Lab 11/27/16 2321 11/28/16 1104 11/29/16 0800 11/30/16 0430  WBC 7.6 6.2 6.2 6.6  HGB 8.9* 9.0* 8.6* 8.6*  HCT 27.1* 27.8* 26.0* 25.9*  MCV 94.8 95.2 95.2 95.6  PLT 136* 115* 118* 100*    Cardiac Enzymes: No results for input(s): CKTOTAL, CKMB, CKMBINDEX, TROPONINI in the last 168 hours.  BNP: BNP (last 3 results)  Recent Labs  11/28/16 1104  BNP 1,825.3*    ProBNP (last 3 results)  Recent Labs  11/27/16 1207  PROBNP 1,746.0*      Other results:  Imaging: Dg Chest Port 1 View  Result Date: 11/28/2016 CLINICAL DATA:  Pt is here with CHF, has a new RT IJ, confirm placement EXAM: PORTABLE CHEST 1 VIEW COMPARISON:  11/27/2016 FINDINGS: Patient's right IJ central line, tip overlying the level of the upper right atrium, approximately 2.0 cm below the junction with the superior vena cava. The heart is enlarged. There is developing opacity in the medial left lung base, now obscuring the hemidiaphragm. No evidence for pneumothorax. Status post median sternotomy. Right-sided AICD lead to the right ventricle. IMPRESSION: 1. Interval placement of right IJ central line, tip to the upper right atrium, approximately 2 point cm below the junction with the superior vena cava. 2. New left lower lobe opacity  consistent with atelectasis or infiltrate. Electronically Signed   By: Nolon Nations M.D.   On: 11/28/2016 13:16     Medications:     Scheduled Medications: . apixaban  5 mg Oral BID  . arformoterol  15 mcg Nebulization BID  . aspirin EC  81 mg Oral Daily  . atorvastatin  40 mg Oral Daily  . budesonide (PULMICORT) nebulizer solution  0.5 mg Nebulization BID  . fluticasone  1 spray Each Nare Daily  . furosemide  120 mg Intravenous Once  . furosemide  80 mg Intravenous BID  . insulin aspart  0-9 Units Subcutaneous TID WC  . insulin glargine  10 Units Subcutaneous QHS  . levothyroxine  25 mcg Oral QAC breakfast  . multivitamin with minerals  1 tablet Oral Daily  . sodium chloride flush  10-40 mL Intracatheter Q12H  . umeclidinium bromide  1 puff Inhalation Daily    Infusions: . amiodarone 30 mg/hr (11/30/16 0125)  . milrinone 0.25 mcg/kg/min (11/30/16 0530)  . norepinephrine (LEVOPHED) Adult infusion 3 mcg/min (11/30/16 1005)    PRN Medications: albuterol, sodium chloride flush, traMADol   Assessment/Plan/Discussion    1. Acute on chronic systolic CHF: Ischemic cardiomyopathy but with prominent RV dysfunction on echo and exam.  Medtronic ICD.  Echo this admission shows EF 30-35% with moderately dilated LV and severely dilated/moderately dysfunction RV with moderate to severe TR.  He was admitted with low output biventricular failure, cardiorenal syndrome, and marked volume overload.  -Remains tenuous but improving with dual inotrope support -Continue current combination of milrinone + norepinephrine. Creatinine has come down to 1.8 - Co-ox 64%.  - Weight down 6 pounds.  - Continue Lasix 80 mg IV bid today and follow diuresis. Still with marked fluid overload. Will add metolazone.  - Hold Entresto, Coreg, hydralazine/Imdur.  - He has had a very concerning trajectory recently with worsening renal function and multiple admissions.  He is out of age range for transplant.  We  discussed LVAD. However RV dysfunction may limit. Will plan on formal RHC after we have optimized diuresis. I am hopeful that given ischemic CM we will be able to tune up his RV enough to permit VAD.  2. CAD: s/p CABG.  No chest pain. Continue statin.  3. AKI on CKD stage III: Suspect cardiorenal syndrome in setting of low output failure/RV failure.  Renal function improving with inotropes.   4. Atrial fibrillation: Chronic.  Rate is not elevated.  He is on apixaban.   5. 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  6. 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 Keep K>4.0 Mg > 2.0   CCT 35 mins  Bensimhon, Daniel MD 11/30/2016, 12:53 PM  Advanced Heart Failure Team Pager [phone] (M-F; 7a - 4p)  Please contact [facility] for night-coverage after hours (4p -7a ) and weekends on [email or web address]

## 2016-12-01 LAB — BASIC METABOLIC PANEL
Anion gap: 14 (ref 5–15)
BUN: 59 mg/dL — AB (ref 6–20)
CO2: 31 mmol/L (ref 22–32)
CREATININE: 1.91 mg/dL — AB (ref 0.61–1.24)
Calcium: 9 mg/dL (ref 8.9–10.3)
Chloride: 90 mmol/L — ABNORMAL LOW (ref 101–111)
GFR calc non Af Amer: 34 mL/min — ABNORMAL LOW (ref >60)
GFR, EST AFRICAN AMERICAN: 39 mL/min — AB (ref >60)
Glucose, Bld: 193 mg/dL — ABNORMAL HIGH (ref 65–99)
POTASSIUM: 3.5 mmol/L (ref 3.5–5.1)
SODIUM: 135 mmol/L (ref 135–145)

## 2016-12-01 LAB — COOXEMETRY PANEL
Carboxyhemoglobin: 2.1 % — ABNORMAL HIGH (ref 0.5–1.5)
Methemoglobin: 0.7 % (ref 0.0–1.5)
O2 SAT: 60.1 %
TOTAL HEMOGLOBIN: 8.3 g/dL — AB (ref 12.0–16.0)

## 2016-12-01 LAB — GLUCOSE, CAPILLARY
GLUCOSE-CAPILLARY: 187 mg/dL — AB (ref 65–99)
GLUCOSE-CAPILLARY: 167 mg/dL — AB (ref 65–99)
Glucose-Capillary: 213 mg/dL — ABNORMAL HIGH (ref 65–99)
Glucose-Capillary: 188 mg/dL — ABNORMAL HIGH (ref 65–99)
Glucose-Capillary: 190 mg/dL — ABNORMAL HIGH (ref 65–99)

## 2016-12-01 NOTE — Progress Notes (Signed)
Advanced Heart Failure Rounding Note   Subjective:    Admitted with marked volume overload and low output HF. BP soft so he was started on milrinone 0.25 mcg + norepi 3 mcg.   Remains on IV lasix. Metolazone added yesterday. Weight down another pound. (Total 7 pounds). Co-ox 60%. CVP 8.   Creatinine trending down 2.26>1.8->1.8-> 1.9  Breathing better. NSVT quiescent on amio  Echo: EF 30-35%, moderate LV dilation, bioprosthetic aortic valve ok, moderate MR, RV severely dilated with moderate dysfunction, moderate to severe TR, PASP 51 mmHg.  Renal u/s with cortical thinning R 12.0 cm L 12.2 cm  Objective:   Weight Range:  Vital Signs:   Temp:  [97.5 F (36.4 C)-98.2 F (36.8 C)] 97.7 F (36.5 C) (01/28 1220) Pulse Rate:  [41-159] 65 (01/28 1220) Resp:  [8-30] 16 (01/28 1220) BP: (85-140)/(30-83) 112/59 (01/28 1220) SpO2:  [86 %-100 %] 86 % (01/28 1220) Weight:  [90.5 kg (199 lb 8 oz)] 90.5 kg (199 lb 8 oz) (01/28 0330) Last BM Date: 12/01/16  Weight change: Filed Weights   11/29/16 0635 11/30/16 0417 12/01/16 0330  Weight: 91.2 kg (201 lb) 90.9 kg (200 lb 4.8 oz) 90.5 kg (199 lb 8 oz)    Intake/Output:   Intake/Output Summary (Last 24 hours) at 12/01/16 1307 Last data filed at 12/01/16 1100  Gross per 24 hour  Intake          1156.67 ml  Output             2851 ml  Net         -1694.33 ml     Physical Exam: CVP 8 General:  No resp difficulty. Sitting in the chair  HEENT: normal.  Neck: supple. JVP 8. Carotids 2+ bilat; no bruits. No lymphadenopathy or thryomegaly appreciated. Cor: PMI nondisplaced. Irregular rate & rhythm.2/6 HSM LLSB, 2/6 SEM RUSB.  Lungs: Decreased in the bases  Abdomen: obese soft, nontender, nondistended. No hepatosplenomegaly. No bruits or masses. Good bowel sounds. Extremities: no cyanosis, clubbing, rash, R and LLE 1+ edema Neuro: alert & orientedx3, cranial nerves grossly intact. moves all 4 extremities w/o difficulty. Affect  pleasant  Telemetry:   A fib 70s  Labs: Basic Metabolic Panel:  Recent Labs Lab 11/28/16 1104 11/29/16 0400 11/29/16 0723 11/29/16 1300 11/30/16 0430 12/01/16 0405  NA 138 138  --  136 135 135  K 3.9 3.7  --  4.2 3.8 3.5  CL 98* 100*  --  99* 95* 90*  CO2 28 28  --  28 27 31   GLUCOSE 119* 147*  --  229* 186* 193*  BUN 97* 82*  --  74* 71* 59*  CREATININE 2.26* 1.81*  --  1.77* 1.83* 1.91*  CALCIUM 9.0 8.8*  --  8.9 9.0 9.0  MG 2.0  --  1.9 1.9  --   --     Liver Function Tests:  Recent Labs Lab 11/28/16 1104  AST 46*  ALT 34  ALKPHOS 119  BILITOT 1.0  PROT 6.5  ALBUMIN 3.5   No results for input(s): LIPASE, AMYLASE in the last 168 hours. No results for input(s): AMMONIA in the last 168 hours.  CBC:  Recent Labs Lab 11/27/16 2321 11/28/16 1104 11/29/16 0800 11/30/16 0430  WBC 7.6 6.2 6.2 6.6  HGB 8.9* 9.0* 8.6* 8.6*  HCT 27.1* 27.8* 26.0* 25.9*  MCV 94.8 95.2 95.2 95.6  PLT 136* 115* 118* 100*    Cardiac Enzymes: No results for  input(s): CKTOTAL, CKMB, CKMBINDEX, TROPONINI in the last 168 hours.  BNP: BNP (last 3 results)  Recent Labs  11/28/16 1104  BNP 1,825.3*    ProBNP (last 3 results)  Recent Labs  11/27/16 1207  PROBNP 1,746.0*      Other results:  Imaging: No results found.   Medications:     Scheduled Medications: . apixaban  5 mg Oral BID  . arformoterol  15 mcg Nebulization BID  . aspirin EC  81 mg Oral Daily  . atorvastatin  40 mg Oral Daily  . budesonide (PULMICORT) nebulizer solution  0.5 mg Nebulization BID  . fluticasone  1 spray Each Nare Daily  . furosemide  120 mg Intravenous Once  . furosemide  80 mg Intravenous BID  . insulin aspart  0-9 Units Subcutaneous TID WC  . insulin glargine  10 Units Subcutaneous QHS  . levothyroxine  25 mcg Oral QAC breakfast  . multivitamin with minerals  1 tablet Oral Daily  . sodium chloride flush  10-40 mL Intracatheter Q12H  . umeclidinium bromide  1 puff Inhalation  Daily    Infusions: . amiodarone 30 mg/hr (12/01/16 0148)  . milrinone 0.25 mcg/kg/min (12/01/16 1000)  . norepinephrine (LEVOPHED) Adult infusion 3 mcg/min (12/01/16 1000)    PRN Medications: albuterol, sodium chloride flush, traMADol   Assessment/Plan/Discussion    1. Acute on chronic systolic CHF: Ischemic cardiomyopathy but with prominent RV dysfunction on echo and exam.  Medtronic ICD.  Echo this admission shows EF 30-35% with moderately dilated LV and severely dilated/moderately dysfunction RV with moderate to severe TR.  He was admitted with low output biventricular failure, cardiorenal syndrome, and marked volume overload.  -Remains tenuous but improving with dual inotrope support.  -Continue current combination of milrinone + norepinephrine. Creatinine stabilizing at 1.8-1.9 - Co-ox 60%.  - Volume status improved. Weight down 6 pounds.  - Continue Lasix 80 mg IV bid today and metolazone. Follow diuresis. Likely can switch to po tomorrow. Once fully diuresed with need RHC either tomorrow or Tuesday.  - Hold Entresto, Coreg, hydralazine/Imdur.  - He has had a very concerning trajectory recently with worsening renal function and multiple admissions.  He is out of age range for transplant.  We discussed LVAD. However RV dysfunction and renal failure may limit. Will plan on formal RHC after we have optimized diuresis. I am hopeful that given ischemic CM we will be able to tune up his RV enough to permit VAD. Decreasing CVP is optimistic finding 2. CAD: s/p CABG.  No signs/sx of ischemia. Continue statin.  3. AKI on CKD stage III: Suspect cardiorenal syndrome in setting of low output failure/RV failure.  Renal function stabilized with inotropes. Renal u/s with medicorenal disease   4. Atrial fibrillation: Chronic.  Rate is not elevated.  He is on apixaban.   5. Aortic stenosis s/p TAVR: Stable on echo.  6. 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 Keep K>4.0 Mg > 2.0     Saumya Hukill MD 12/01/2016, 1:07 PM  Advanced Heart Failure Team Pager [phone] (M-F; 7a - 4p)  Please contact [facility] for night-coverage after hours (4p -7a ) and weekends on [email or web address]

## 2016-12-01 NOTE — Progress Notes (Signed)
Pt has been refusing NIV for the past few nights. Per RRT note.

## 2016-12-02 ENCOUNTER — Encounter (HOSPITAL_COMMUNITY): Payer: Self-pay | Admitting: Cardiology

## 2016-12-02 ENCOUNTER — Encounter (HOSPITAL_COMMUNITY)
Admission: EM | Disposition: A | Discharge status: Home Health | Payer: Self-pay | Source: Home / Self Care | Attending: Cardiology

## 2016-12-02 DIAGNOSIS — I509 Heart failure, unspecified: Secondary | ICD-10-CM

## 2016-12-02 HISTORY — PX: CARDIAC CATHETERIZATION: SHX172

## 2016-12-02 LAB — POCT I-STAT 3, ART BLOOD GAS (G3+)
ACID-BASE EXCESS: 9 mmol/L — AB (ref 0.0–2.0)
BICARBONATE: 33.1 mmol/L — AB (ref 20.0–28.0)
O2 SAT: 95 %
PO2 ART: 71 mmHg — AB (ref 83.0–108.0)
TCO2: 34 mmol/L (ref 0–100)
pCO2 arterial: 42.3 mmHg (ref 32.0–48.0)
pH, Arterial: 7.501 — ABNORMAL HIGH (ref 7.350–7.450)

## 2016-12-02 LAB — GLUCOSE, CAPILLARY
GLUCOSE-CAPILLARY: 154 mg/dL — AB (ref 65–99)
Glucose-Capillary: 133 mg/dL — ABNORMAL HIGH (ref 65–99)
Glucose-Capillary: 279 mg/dL — ABNORMAL HIGH (ref 65–99)
Glucose-Capillary: 149 mg/dL — ABNORMAL HIGH (ref 65–99)

## 2016-12-02 LAB — COOXEMETRY PANEL
Carboxyhemoglobin: 2.3 % — ABNORMAL HIGH (ref 0.5–1.5)
METHEMOGLOBIN: 0.7 % (ref 0.0–1.5)
O2 Saturation: 69.3 %
TOTAL HEMOGLOBIN: 8.9 g/dL — AB (ref 12.0–16.0)

## 2016-12-02 LAB — POCT I-STAT 3, VENOUS BLOOD GAS (G3P V)
Acid-Base Excess: 10 mmol/L — ABNORMAL HIGH (ref 0.0–2.0)
BICARBONATE: 35 mmol/L — AB (ref 20.0–28.0)
O2 Saturation: 63 %
TCO2: 36 mmol/L (ref 0–100)
pCO2, Ven: 47.8 mmHg (ref 44.0–60.0)
pH, Ven: 7.473 — ABNORMAL HIGH (ref 7.250–7.430)
pO2, Ven: 31 mmHg — CL (ref 32.0–45.0)

## 2016-12-02 LAB — BASIC METABOLIC PANEL
ANION GAP: 11 (ref 5–15)
BUN: 50 mg/dL — AB (ref 6–20)
CALCIUM: 8.8 mg/dL — AB (ref 8.9–10.3)
CO2: 32 mmol/L (ref 22–32)
Chloride: 91 mmol/L — ABNORMAL LOW (ref 101–111)
Creatinine, Ser: 1.77 mg/dL — ABNORMAL HIGH (ref 0.61–1.24)
GFR calc Af Amer: 43 mL/min — ABNORMAL LOW (ref >60)
GFR calc non Af Amer: 37 mL/min — ABNORMAL LOW (ref >60)
GLUCOSE: 165 mg/dL — AB (ref 65–99)
POTASSIUM: 3 mmol/L — AB (ref 3.5–5.1)
SODIUM: 134 mmol/L — AB (ref 135–145)

## 2016-12-02 LAB — ECHOCARDIOGRAM COMPLETE
Height: 65 in
WEIGHTICAEL: 3269.86 [oz_av]

## 2016-12-02 LAB — PROTIME-INR
INR: 1.61
Prothrombin Time: 19.4 seconds — ABNORMAL HIGH (ref 11.4–15.2)

## 2016-12-02 SURGERY — RIGHT HEART CATH

## 2016-12-02 MED ORDER — POTASSIUM CHLORIDE CRYS ER 20 MEQ PO TBCR
40.0000 meq | EXTENDED_RELEASE_TABLET | Freq: Once | ORAL | Status: AC
  Administered 2016-12-02: 40 meq via ORAL
  Filled 2016-12-02: qty 2

## 2016-12-02 MED ORDER — SODIUM CHLORIDE 0.9% FLUSH: 3.0000 mL | INTRAVENOUS | Status: DC | PRN

## 2016-12-02 MED ORDER — MIDAZOLAM HCL 2 MG/2ML IJ SOLN
INTRAMUSCULAR | Status: AC
  Filled 2016-12-02: qty 2

## 2016-12-02 MED ORDER — SODIUM CHLORIDE 0.9% FLUSH
3.0000 mL | Freq: Two times a day (BID) | INTRAVENOUS | Status: DC
  Administered 2016-12-02: 3 mL via INTRAVENOUS

## 2016-12-02 MED ORDER — FENTANYL CITRATE (PF) 100 MCG/2ML IJ SOLN
INTRAMUSCULAR | Status: AC
  Filled 2016-12-02: qty 2

## 2016-12-02 MED ORDER — SODIUM CHLORIDE 0.9 % IV SOLN: INTRAVENOUS | Status: DC

## 2016-12-02 MED ORDER — ACETAMINOPHEN 325 MG PO TABS: 650.0000 mg | ORAL_TABLET | ORAL | Status: DC | PRN

## 2016-12-02 MED ORDER — SODIUM CHLORIDE 0.9% FLUSH: 3.0000 mL | Freq: Two times a day (BID) | INTRAVENOUS | Status: DC

## 2016-12-02 MED ORDER — FENTANYL CITRATE (PF) 100 MCG/2ML IJ SOLN
INTRAMUSCULAR | Status: DC | PRN
  Administered 2016-12-02: 12.5 ug via INTRAVENOUS

## 2016-12-02 MED ORDER — SODIUM CHLORIDE 0.9 % IV SOLN: 250.0000 mL | INTRAVENOUS | Status: DC | PRN

## 2016-12-02 MED ORDER — MIDAZOLAM HCL 2 MG/2ML IJ SOLN
INTRAMUSCULAR | Status: DC | PRN
  Administered 2016-12-02: 0.5 mg via INTRAVENOUS

## 2016-12-02 MED ORDER — SODIUM CHLORIDE 0.9 % IV SOLN
250.0000 mL | INTRAVENOUS | Status: DC | PRN
Start: 2016-12-02 — End: 2016-12-02

## 2016-12-02 MED ORDER — LIDOCAINE HCL (PF) 1 % IJ SOLN
INTRAMUSCULAR | Status: AC
  Filled 2016-12-02: qty 30

## 2016-12-02 MED ORDER — LIDOCAINE HCL (PF) 1 % IJ SOLN
INTRAMUSCULAR | Status: DC | PRN
  Administered 2016-12-02: 5 mL
  Administered 2016-12-02: 15 mL

## 2016-12-02 MED ORDER — ONDANSETRON HCL 4 MG/2ML IJ SOLN: 4.0000 mg | Freq: Four times a day (QID) | INTRAMUSCULAR | Status: DC | PRN

## 2016-12-02 MED ORDER — HEPARIN (PORCINE) IN NACL 2-0.9 UNIT/ML-% IJ SOLN
INTRAMUSCULAR | Status: DC | PRN
  Administered 2016-12-02: 500 mL

## 2016-12-02 MED ORDER — APIXABAN 5 MG PO TABS
5.0000 mg | ORAL_TABLET | Freq: Two times a day (BID) | ORAL | Status: DC
  Administered 2016-12-02 – 2016-12-04 (×5): 5 mg via ORAL
  Filled 2016-12-02 (×6): qty 1

## 2016-12-02 MED ORDER — HEPARIN (PORCINE) IN NACL 2-0.9 UNIT/ML-% IJ SOLN
INTRAMUSCULAR | Status: AC
  Filled 2016-12-02: qty 500

## 2016-12-02 SURGICAL SUPPLY — 9 items
CATH SWAN GANZ 7F STRAIGHT (CATHETERS) ×3 IMPLANT
PACK CARDIAC CATHETERIZATION (CUSTOM PROCEDURE TRAY) ×3 IMPLANT
PROTECTION STATION PRESSURIZED (MISCELLANEOUS) ×3
SHEATH FAST CATH BRACH 5F 5CM (SHEATH) ×3 IMPLANT
SHEATH PINNACLE 7F 10CM (SHEATH) ×3 IMPLANT
STATION PROTECTION PRESSURIZED (MISCELLANEOUS) ×1 IMPLANT
TRANSDUCER W/STOPCOCK (MISCELLANEOUS) ×3 IMPLANT
TUBING ART PRESS 72  MALE/FEM (TUBING) ×2
TUBING ART PRESS 72 MALE/FEM (TUBING) ×1 IMPLANT

## 2016-12-02 NOTE — Interval H&P Note (Signed)
History and Physical Interval Note:  12/02/2016 10:31 AM  Trevor [name]  has presented today for surgery, with the diagnosis of hf  The various methods of treatment have been discussed with the patient and family. After consideration of risks, benefits and other options for treatment, the patient has consented to  Procedure(s): Right Heart Cath (N/A) as a surgical intervention .  [name] history has been reviewed, patient examined, no change in status, stable for surgery.  I have reviewed the patient's chart and labs.  Questions were answered to the patient's satisfaction.     Trevor [name]

## 2016-12-02 NOTE — H&P (View-Only) (Signed)
Patient ID: Trevor [name], male   DOB: January 22, 1946, 71 y.o.   MRN: [id number]    Advanced Heart Failure Rounding Note   Subjective:    Admitted with marked volume overload and low output HF. BP soft so he was started on milrinone 0.25 mcg + norepi 3 mcg.   Remains on IV lasix.  Weight down again, total 12 lbs. Co-ox 69%. CVP 10.   Creatinine trending down 2.26 ->1.8->1.8-> 1.9 -> 1.77.  Breathing ok.   Breathing better. NSVT quiescent on amio  Echo: EF 30-35%, moderate LV dilation, bioprosthetic aortic valve ok, moderate MR, RV severely dilated with moderate dysfunction, moderate to severe TR, PASP 51 mmHg.   Renal u/s with cortical thinning R 12.0 cm L 12.2 cm  Objective:   Weight Range:  Vital Signs:   Temp:  [97.3 F (36.3 C)-97.9 F (36.6 C)] 97.8 F (36.6 C) (01/29 0400) Pulse Rate:  [33-92] 61 (01/29 0700) Resp:  [9-27] 20 (01/29 0700) BP: (82-127)/(47-84) 95/62 (01/29 0700) SpO2:  [85 %-99 %] 97 % (01/29 0700) Weight:  [194 lb 0.1 oz (88 kg)] 194 lb 0.1 oz (88 kg) (01/29 0500) Last BM Date: 12/01/16  Weight change: Filed Weights   11/30/16 0417 12/01/16 0330 12/02/16 0500  Weight: 200 lb 4.8 oz (90.9 kg) 199 lb 8 oz (90.5 kg) 194 lb 0.1 oz (88 kg)    Intake/Output:   Intake/Output Summary (Last 24 hours) at 12/02/16 [id number] Last data filed at 12/02/16 0700  Gross per 24 hour  Intake             1055 ml  Output             2500 ml  Net            -1445 ml     Physical Exam: CVP 10 General:  No resp difficulty. Sitting in the chair  HEENT: normal.  Neck: supple. JVP 8-9. Carotids 2+ bilat; no bruits. No lymphadenopathy or thryomegaly appreciated. Cor: PMI nondisplaced. Irregular rate & rhythm. 2/6 HSM LLSB, 2/6 SEM RUSB.  Lungs: Decreased in the bases  Abdomen: obese soft, nontender, nondistended. No hepatosplenomegaly. No bruits or masses. Good bowel sounds. Extremities: no cyanosis, clubbing, rash, R and LLE 1+ edema 3/4 to knees bilaterally.  Neuro:  alert & orientedx3, cranial nerves grossly intact. moves all 4 extremities w/o difficulty. Affect pleasant  Telemetry:   A fib 70s-90s  Labs: Basic Metabolic Panel:  Recent Labs Lab 11/28/16 1104 11/29/16 0400 11/29/16 0723 11/29/16 1300 11/30/16 0430 12/01/16 0405 12/02/16 0445  NA 138 138  --  136 135 135 134*  K 3.9 3.7  --  4.2 3.8 3.5 3.0*  CL 98* 100*  --  99* 95* 90* 91*  CO2 28 28  --  28 27 31  32  GLUCOSE 119* 147*  --  229* 186* 193* 165*  BUN 97* 82*  --  74* 71* 59* 50*  CREATININE 2.26* 1.81*  --  1.77* 1.83* 1.91* 1.77*  CALCIUM 9.0 8.8*  --  8.9 9.0 9.0 8.8*  MG 2.0  --  1.9 1.9  --   --   --     Liver Function Tests:  Recent Labs Lab 11/28/16 1104  AST 46*  ALT 34  ALKPHOS 119  BILITOT 1.0  PROT 6.5  ALBUMIN 3.5   No results for input(s): LIPASE, AMYLASE in the last 168 hours. No results for input(s): AMMONIA in the last 168 hours.  CBC:  Recent Labs Lab 11/27/16 2321 11/28/16 1104 11/29/16 0800 11/30/16 0430  WBC 7.6 6.2 6.2 6.6  HGB 8.9* 9.0* 8.6* 8.6*  HCT 27.1* 27.8* 26.0* 25.9*  MCV 94.8 95.2 95.2 95.6  PLT 136* 115* 118* 100*    Cardiac Enzymes: No results for input(s): CKTOTAL, CKMB, CKMBINDEX, TROPONINI in the last 168 hours.  BNP: BNP (last 3 results)  Recent Labs  11/28/16 1104  BNP 1,825.3*    ProBNP (last 3 results)  Recent Labs  11/27/16 1207  PROBNP 1,746.0*      Other results:  Imaging: No results found.   Medications:     Scheduled Medications: . arformoterol  15 mcg Nebulization BID  . aspirin EC  81 mg Oral Daily  . atorvastatin  40 mg Oral Daily  . budesonide (PULMICORT) nebulizer solution  0.5 mg Nebulization BID  . fluticasone  1 spray Each Nare Daily  . furosemide  120 mg Intravenous Once  . furosemide  80 mg Intravenous BID  . insulin aspart  0-9 Units Subcutaneous TID WC  . insulin glargine  10 Units Subcutaneous QHS  . levothyroxine  25 mcg Oral QAC breakfast  . multivitamin  with minerals  1 tablet Oral Daily  . sodium chloride flush  10-40 mL Intracatheter Q12H  . umeclidinium bromide  1 puff Inhalation Daily    Infusions: . amiodarone 30 mg/hr (12/01/16 2346)  . milrinone 0.25 mcg/kg/min (12/01/16 2343)  . norepinephrine (LEVOPHED) Adult infusion 3 mcg/min (12/01/16 1000)    PRN Medications: albuterol, sodium chloride flush, traMADol   Assessment/Plan/Discussion    1. Acute on chronic systolic CHF: Ischemic cardiomyopathy but with prominent RV dysfunction on echo and exam.  Medtronic ICD.  Echo this admission shows EF 30-35% with moderately dilated LV and severely dilated/moderately dysfunction RV with moderate to severe TR.  He was admitted with low output biventricular failure, cardiorenal syndrome, and marked volume overload. He is improved with dual inotrope support. Co-ox 69%, CVP 10.  He continues to diurese, weight down.  Creatinine coming down.  - Continue milrinone 0.25 and decrease norepinephrine to 2.  - Continue IV Lasix today, probably to po tomorrow.   - RHC today to help ascertain RV function.  - Hold Entresto, Coreg, hydralazine/Imdur.  - He has had a very concerning trajectory recently with worsening renal function and multiple admissions.  He is out of age range for transplant.  We have discussed LVAD. However RV dysfunction and renal failure may limit. RHC today as above. I am hopeful that given ischemic CMP, we will be able to tune up his RV enough to permit VAD. Decreasing CVP is optimistic finding 2. CAD: s/p CABG.  No signs/sx of ischemia. Continue statin.  3. AKI on CKD stage III: Suspect cardiorenal syndrome in setting of low output failure/RV failure.  Renal function stabilized with inotropes. Renal US with medicorenal disease   4. Atrial fibrillation: Chronic.  Rate is not elevated.  He is on apixaban => hold for RHC today then restart.   5. Aortic stenosis s/p TAVR: Stable on echo.  6. 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 Keep K>4.0 Mg > 2.0   35 minutes critical care time.   Loralie Champagne MD 12/02/2016, 8:22 AM  Advanced Heart Failure Team Pager [phone] (M-F; 7a - 4p)  Please contact [facility] for night-coverage after hours (4p -7a ) and weekends on [email or web address]

## 2016-12-02 NOTE — Progress Notes (Signed)
Patient ID: Trevor [name], male   DOB: 11-30-1945, 71 y.o.   MRN: [id number]    Advanced Heart Failure Rounding Note   Subjective:    Admitted with marked volume overload and low output HF. BP soft so he was started on milrinone 0.25 mcg + norepi 3 mcg.   Remains on IV lasix.  Weight down again, total 12 lbs. Co-ox 69%. CVP 10.   Creatinine trending down 2.26 ->1.8->1.8-> 1.9 -> 1.77.  Breathing ok.   Breathing better. NSVT quiescent on amio  Echo: EF 30-35%, moderate LV dilation, bioprosthetic aortic valve ok, moderate MR, RV severely dilated with moderate dysfunction, moderate to severe TR, PASP 51 mmHg.   Renal u/s with cortical thinning R 12.0 cm L 12.2 cm  Objective:   Weight Range:  Vital Signs:   Temp:  [97.3 F (36.3 C)-97.9 F (36.6 C)] 97.8 F (36.6 C) (01/29 0400) Pulse Rate:  [33-92] 61 (01/29 0700) Resp:  [9-27] 20 (01/29 0700) BP: (82-127)/(47-84) 95/62 (01/29 0700) SpO2:  [85 %-99 %] 97 % (01/29 0700) Weight:  [194 lb 0.1 oz (88 kg)] 194 lb 0.1 oz (88 kg) (01/29 0500) Last BM Date: 12/01/16  Weight change: Filed Weights   11/30/16 0417 12/01/16 0330 12/02/16 0500  Weight: 200 lb 4.8 oz (90.9 kg) 199 lb 8 oz (90.5 kg) 194 lb 0.1 oz (88 kg)    Intake/Output:   Intake/Output Summary (Last 24 hours) at 12/02/16 [id number] Last data filed at 12/02/16 0700  Gross per 24 hour  Intake             1055 ml  Output             2500 ml  Net            -1445 ml     Physical Exam: CVP 10 General:  No resp difficulty. Sitting in the chair  HEENT: normal.  Neck: supple. JVP 8-9. Carotids 2+ bilat; no bruits. No lymphadenopathy or thryomegaly appreciated. Cor: PMI nondisplaced. Irregular rate & rhythm. 2/6 HSM LLSB, 2/6 SEM RUSB.  Lungs: Decreased in the bases  Abdomen: obese soft, nontender, nondistended. No hepatosplenomegaly. No bruits or masses. Good bowel sounds. Extremities: no cyanosis, clubbing, rash, R and LLE 1+ edema 3/4 to knees bilaterally.  Neuro:  alert & orientedx3, cranial nerves grossly intact. moves all 4 extremities w/o difficulty. Affect pleasant  Telemetry:   A fib 70s-90s  Labs: Basic Metabolic Panel:  Recent Labs Lab 11/28/16 1104 11/29/16 0400 11/29/16 0723 11/29/16 1300 11/30/16 0430 12/01/16 0405 12/02/16 0445  NA 138 138  --  136 135 135 134*  K 3.9 3.7  --  4.2 3.8 3.5 3.0*  CL 98* 100*  --  99* 95* 90* 91*  CO2 28 28  --  28 27 31  32  GLUCOSE 119* 147*  --  229* 186* 193* 165*  BUN 97* 82*  --  74* 71* 59* 50*  CREATININE 2.26* 1.81*  --  1.77* 1.83* 1.91* 1.77*  CALCIUM 9.0 8.8*  --  8.9 9.0 9.0 8.8*  MG 2.0  --  1.9 1.9  --   --   --     Liver Function Tests:  Recent Labs Lab 11/28/16 1104  AST 46*  ALT 34  ALKPHOS 119  BILITOT 1.0  PROT 6.5  ALBUMIN 3.5   No results for input(s): LIPASE, AMYLASE in the last 168 hours. No results for input(s): AMMONIA in the last 168 hours.  CBC:  Recent Labs Lab 11/27/16 2321 11/28/16 1104 11/29/16 0800 11/30/16 0430  WBC 7.6 6.2 6.2 6.6  HGB 8.9* 9.0* 8.6* 8.6*  HCT 27.1* 27.8* 26.0* 25.9*  MCV 94.8 95.2 95.2 95.6  PLT 136* 115* 118* 100*    Cardiac Enzymes: No results for input(s): CKTOTAL, CKMB, CKMBINDEX, TROPONINI in the last 168 hours.  BNP: BNP (last 3 results)  Recent Labs  11/28/16 1104  BNP 1,825.3*    ProBNP (last 3 results)  Recent Labs  11/27/16 1207  PROBNP 1,746.0*      Other results:  Imaging: No results found.   Medications:     Scheduled Medications: . arformoterol  15 mcg Nebulization BID  . aspirin EC  81 mg Oral Daily  . atorvastatin  40 mg Oral Daily  . budesonide (PULMICORT) nebulizer solution  0.5 mg Nebulization BID  . fluticasone  1 spray Each Nare Daily  . furosemide  120 mg Intravenous Once  . furosemide  80 mg Intravenous BID  . insulin aspart  0-9 Units Subcutaneous TID WC  . insulin glargine  10 Units Subcutaneous QHS  . levothyroxine  25 mcg Oral QAC breakfast  . multivitamin  with minerals  1 tablet Oral Daily  . sodium chloride flush  10-40 mL Intracatheter Q12H  . umeclidinium bromide  1 puff Inhalation Daily    Infusions: . amiodarone 30 mg/hr (12/01/16 2346)  . milrinone 0.25 mcg/kg/min (12/01/16 2343)  . norepinephrine (LEVOPHED) Adult infusion 3 mcg/min (12/01/16 1000)    PRN Medications: albuterol, sodium chloride flush, traMADol   Assessment/Plan/Discussion    1. Acute on chronic systolic CHF: Ischemic cardiomyopathy but with prominent RV dysfunction on echo and exam.  Medtronic ICD.  Echo this admission shows EF 30-35% with moderately dilated LV and severely dilated/moderately dysfunction RV with moderate to severe TR.  He was admitted with low output biventricular failure, cardiorenal syndrome, and marked volume overload. He is improved with dual inotrope support. Co-ox 69%, CVP 10.  He continues to diurese, weight down.  Creatinine coming down.  - Continue milrinone 0.25 and decrease norepinephrine to 2.  - Continue IV Lasix today, probably to po tomorrow.   - RHC today to help ascertain RV function.  - Hold Entresto, Coreg, hydralazine/Imdur.  - He has had a very concerning trajectory recently with worsening renal function and multiple admissions.  He is out of age range for transplant.  We have discussed LVAD. However RV dysfunction and renal failure may limit. RHC today as above. I am hopeful that given ischemic CMP, we will be able to tune up his RV enough to permit VAD. Decreasing CVP is optimistic finding 2. CAD: s/p CABG.  No signs/sx of ischemia. Continue statin.  3. AKI on CKD stage III: Suspect cardiorenal syndrome in setting of low output failure/RV failure.  Renal function stabilized with inotropes. Renal US with medicorenal disease   4. Atrial fibrillation: Chronic.  Rate is not elevated.  He is on apixaban => hold for RHC today then restart.   5. Aortic stenosis s/p TAVR: Stable on echo.  6. 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 Keep K>4.0 Mg > 2.0   35 minutes critical care time.   Loralie Champagne MD 12/02/2016, 8:22 AM  Advanced Heart Failure Team Pager [phone] (M-F; 7a - 4p)  Please contact [facility] for night-coverage after hours (4p -7a ) and weekends on [email or web address]

## 2016-12-03 ENCOUNTER — Inpatient Hospital Stay (HOSPITAL_COMMUNITY): Payer: Medicare Other

## 2016-12-03 LAB — COOXEMETRY PANEL
CARBOXYHEMOGLOBIN: 1.7 % — AB (ref 0.5–1.5)
CARBOXYHEMOGLOBIN: 1.8 % — AB (ref 0.5–1.5)
Carboxyhemoglobin: 2.2 % — ABNORMAL HIGH (ref 0.5–1.5)
METHEMOGLOBIN: 1 % (ref 0.0–1.5)
METHEMOGLOBIN: 0.8 % (ref 0.0–1.5)
Methemoglobin: 0.8 % (ref 0.0–1.5)
O2 Saturation: 63.7 %
O2 Saturation: 46.2 %
O2 Saturation: 63.1 %
TOTAL HEMOGLOBIN: 8.7 g/dL — AB (ref 12.0–16.0)
TOTAL HEMOGLOBIN: 9.5 g/dL — AB (ref 12.0–16.0)
Total hemoglobin: 8.7 g/dL — ABNORMAL LOW (ref 12.0–16.0)

## 2016-12-03 LAB — SURGICAL PCR SCREEN
MRSA, PCR: NEGATIVE
Staphylococcus aureus: NEGATIVE

## 2016-12-03 LAB — GLUCOSE, CAPILLARY
GLUCOSE-CAPILLARY: 148 mg/dL — AB (ref 65–99)
Glucose-Capillary: 223 mg/dL — ABNORMAL HIGH (ref 65–99)
Glucose-Capillary: 202 mg/dL — ABNORMAL HIGH (ref 65–99)
Glucose-Capillary: 187 mg/dL — ABNORMAL HIGH (ref 65–99)

## 2016-12-03 LAB — RAPID URINE DRUG SCREEN, HOSP PERFORMED
AMPHETAMINES: NOT DETECTED
BARBITURATES: NOT DETECTED
BENZODIAZEPINES: NOT DETECTED
COCAINE: NOT DETECTED
OPIATES: NOT DETECTED
TETRAHYDROCANNABINOL: NOT DETECTED

## 2016-12-03 LAB — BASIC METABOLIC PANEL
Anion gap: 13 (ref 5–15)
BUN: 46 mg/dL — AB (ref 6–20)
CALCIUM: 9 mg/dL (ref 8.9–10.3)
CHLORIDE: 89 mmol/L — AB (ref 101–111)
CO2: 32 mmol/L (ref 22–32)
CREATININE: 1.79 mg/dL — AB (ref 0.61–1.24)
GFR calc non Af Amer: 36 mL/min — ABNORMAL LOW (ref >60)
GFR, EST AFRICAN AMERICAN: 42 mL/min — AB (ref >60)
Glucose, Bld: 146 mg/dL — ABNORMAL HIGH (ref 65–99)
Potassium: 3.4 mmol/L — ABNORMAL LOW (ref 3.5–5.1)
SODIUM: 134 mmol/L — AB (ref 135–145)

## 2016-12-03 LAB — CBC
HCT: 26.2 % — ABNORMAL LOW (ref 39.0–52.0)
Hemoglobin: 8.6 g/dL — ABNORMAL LOW (ref 13.0–17.0)
MCH: 31.4 pg (ref 26.0–34.0)
MCHC: 32.8 g/dL (ref 30.0–36.0)
MCV: 95.6 fL (ref 78.0–100.0)
PLATELETS: 96 10*3/uL — AB (ref 150–400)
RBC: 2.74 MIL/uL — AB (ref 4.22–5.81)
RDW: 18.2 % — AB (ref 11.5–15.5)
WBC: 6.6 10*3/uL (ref 4.0–10.5)

## 2016-12-03 LAB — MAGNESIUM: Magnesium: 1.9 mg/dL (ref 1.7–2.4)

## 2016-12-03 LAB — TSH: TSH: 3.621 u[IU]/mL (ref 0.350–4.500)

## 2016-12-03 MED ORDER — IOPAMIDOL (ISOVUE-300) INJECTION 61%
INTRAVENOUS | Status: AC
  Administered 2016-12-03: 17:00:00
  Filled 2016-12-03: qty 30

## 2016-12-03 MED ORDER — DIGOXIN 125 MCG PO TABS
0.0625 mg | ORAL_TABLET | Freq: Every day | ORAL | Status: DC
  Administered 2016-12-04 – 2016-12-12 (×9): 0.0625 mg via ORAL
  Filled 2016-12-03 (×9): qty 1

## 2016-12-03 MED ORDER — AMIODARONE HCL 200 MG PO TABS
200.0000 mg | ORAL_TABLET | Freq: Two times a day (BID) | ORAL | Status: DC
  Administered 2016-12-03 – 2016-12-04 (×3): 200 mg via ORAL
  Filled 2016-12-03 (×3): qty 1

## 2016-12-03 MED ORDER — DIGOXIN 125 MCG PO TABS
0.1250 mg | ORAL_TABLET | Freq: Once | ORAL | Status: AC
  Administered 2016-12-03: 0.125 mg via ORAL
  Filled 2016-12-03: qty 1

## 2016-12-03 MED ORDER — POTASSIUM CHLORIDE CRYS ER 20 MEQ PO TBCR
40.0000 meq | EXTENDED_RELEASE_TABLET | Freq: Once | ORAL | Status: AC
Start: 2016-12-03 — End: 2016-12-03
  Administered 2016-12-03: 40 meq via ORAL
  Filled 2016-12-03: qty 2

## 2016-12-03 MED ORDER — TORSEMIDE 20 MG PO TABS
60.0000 mg | ORAL_TABLET | Freq: Every day | ORAL | Status: DC
  Administered 2016-12-03 – 2016-12-04 (×2): 60 mg via ORAL
  Filled 2016-12-03 (×2): qty 3

## 2016-12-03 MED ORDER — TRAZODONE HCL 50 MG PO TABS
50.0000 mg | ORAL_TABLET | Freq: Every evening | ORAL | Status: DC | PRN
  Administered 2016-12-03 – 2016-12-31 (×18): 50 mg via ORAL
  Filled 2016-12-03 (×18): qty 1

## 2016-12-03 MED ORDER — SPIRONOLACTONE 25 MG PO TABS
12.5000 mg | ORAL_TABLET | Freq: Every day | ORAL | Status: DC
  Administered 2016-12-03 – 2016-12-04 (×2): 12.5 mg via ORAL
  Filled 2016-12-03 (×2): qty 1

## 2016-12-03 NOTE — Progress Notes (Signed)
Patient ID: Trevor [name], male   DOB: 1946/06/08, 71 y.o.   MRN: [id number]    Advanced Heart Failure Rounding Note   Subjective:    Admitted with marked volume overload and low output HF. BP soft so he was started on milrinone 0.25 mcg + norepi 3 mcg.   Norepinephrine decreased to 1 yesterday.  Remains on IV lasix.  Weight down 2 lbs. Co-ox 64%. CVP 8-9.   Creatinine trending down 2.26 ->1.8->1.8-> 1.9 -> 1.77 -> 1.74.  Breathing better, walking in halls.    NSVT quiescent on amio  Echo: EF 25-30%, moderate LV dilation, bioprosthetic aortic valve ok, moderate MR, RV moderately dilated with moderate dysfunction, moderate to severe TR, PASP 51 mmHg.   Renal u/s with cortical thinning R 12.0 cm L 12.2 cm  RHC Procedural Findings (on milrinone 0.25 + norepinephrine 2): Hemodynamics (mmHg) RA mean 9 RV 52/11 PA 52/16, mean 31 PCWP mean 16 CVP/PCWP = 0.56 Oxygen saturations: PA 63% AO 95% Cardiac Output (Fick) 6.93  Cardiac Index (Fick) 3.55 PVR 2.1 WU Cardiac Output (Thermo) 6.16 Cardiac Index (Thermo) 3.16  PVR 2.4 WU  Objective:   Weight Range:  Vital Signs:   Temp:  [97.6 F (36.4 C)-98.1 F (36.7 C)] 97.7 F (36.5 C) (01/29 2329) Pulse Rate:  [27-117] 76 (01/29 1400) Resp:  [10-19] 18 (01/30 0600) BP: (84-143)/(36-79) 103/53 (01/30 0600) SpO2:  [94 %-100 %] 98 % (01/29 2001) Weight:  [192 lb 4.8 oz (87.2 kg)] 192 lb 4.8 oz (87.2 kg) (01/30 0500) Last BM Date: 12/02/16  Weight change: Filed Weights   12/01/16 0330 12/02/16 0500 12/03/16 0500  Weight: 199 lb 8 oz (90.5 kg) 194 lb 0.1 oz (88 kg) 192 lb 4.8 oz (87.2 kg)    Intake/Output:   Intake/Output Summary (Last 24 hours) at 12/03/16 0743 Last data filed at 12/03/16 0730  Gross per 24 hour  Intake          1281.95 ml  Output             2200 ml  Net          -918.05 ml     Physical Exam: CVP 10 General:  No resp difficulty. Sitting in the chair  HEENT: normal.  Neck: supple. JVP 8-9.  Carotids 2+ bilat; no bruits. No lymphadenopathy or thryomegaly appreciated. Cor: PMI nondisplaced. Irregular rate & rhythm. 2/6 HSM LLSB, 2/6 SEM RUSB.  Lungs: Decreased in the bases  Abdomen: obese soft, nontender, nondistended. No hepatosplenomegaly. No bruits or masses. Good bowel sounds. Extremities: no cyanosis, clubbing, rash, R and LLE 1+ edema 3/4 to knees bilaterally.  Neuro: alert & orientedx3, cranial nerves grossly intact. moves all 4 extremities w/o difficulty. Affect pleasant  Telemetry:   A fib 70s-90s  Labs: Basic Metabolic Panel:  Recent Labs Lab 11/28/16 1104  11/29/16 0723 11/29/16 1300 11/30/16 0430 12/01/16 0405 12/02/16 0445 12/03/16 0500  NA 138  < >  --  136 135 135 134* 134*  K 3.9  < >  --  4.2 3.8 3.5 3.0* 3.4*  CL 98*  < >  --  99* 95* 90* 91* 89*  CO2 28  < >  --  28 27 31  32 32  GLUCOSE 119*  < >  --  229* 186* 193* 165* 146*  BUN 97*  < >  --  74* 71* 59* 50* 46*  CREATININE 2.26*  < >  --  1.77* 1.83* 1.91* 1.77* 1.79*  CALCIUM  9.0  < >  --  8.9 9.0 9.0 8.8* 9.0  MG 2.0  --  1.9 1.9  --   --   --  1.9  < > = values in this interval not displayed.  Liver Function Tests:  Recent Labs Lab 11/28/16 1104  AST 46*  ALT 34  ALKPHOS 119  BILITOT 1.0  PROT 6.5  ALBUMIN 3.5   No results for input(s): LIPASE, AMYLASE in the last 168 hours. No results for input(s): AMMONIA in the last 168 hours.  CBC:  Recent Labs Lab 11/27/16 2321 11/28/16 1104 11/29/16 0800 11/30/16 0430 12/03/16 0500  WBC 7.6 6.2 6.2 6.6 6.6  HGB 8.9* 9.0* 8.6* 8.6* 8.6*  HCT 27.1* 27.8* 26.0* 25.9* 26.2*  MCV 94.8 95.2 95.2 95.6 95.6  PLT 136* 115* 118* 100* 96*    Cardiac Enzymes: No results for input(s): CKTOTAL, CKMB, CKMBINDEX, TROPONINI in the last 168 hours.  BNP: BNP (last 3 results)  Recent Labs  11/28/16 1104  BNP 1,825.3*    ProBNP (last 3 results)  Recent Labs  11/27/16 1207  PROBNP 1,746.0*      Other results:  Imaging: No  results found.   Medications:     Scheduled Medications: . amiodarone  200 mg Oral BID  . apixaban  5 mg Oral BID  . arformoterol  15 mcg Nebulization BID  . atorvastatin  40 mg Oral Daily  . budesonide (PULMICORT) nebulizer solution  0.5 mg Nebulization BID  . [START ON 12/04/2016] digoxin  0.0625 mg Oral Daily  . digoxin  0.125 mg Oral Once  . fluticasone  1 spray Each Nare Daily  . insulin aspart  0-9 Units Subcutaneous TID WC  . insulin glargine  10 Units Subcutaneous QHS  . levothyroxine  25 mcg Oral QAC breakfast  . multivitamin with minerals  1 tablet Oral Daily  . potassium chloride  40 mEq Oral Once  . spironolactone  12.5 mg Oral Daily  . torsemide  60 mg Oral Daily  . umeclidinium bromide  1 puff Inhalation Daily    Infusions: . milrinone 0.25 mcg/kg/min (12/02/16 2229)    PRN Medications: acetaminophen, albuterol, ondansetron (ZOFRAN) IV, sodium chloride flush, traMADol   Assessment/Plan/Discussion    1. Acute on chronic systolic CHF: Ischemic cardiomyopathy but with prominent RV dysfunction on echo and exam.  Medtronic ICD.  Echo this admission shows EF 25-30% with moderately dilated LV and moderately dilated/moderately dysfunction RV with moderate to severe TR.  He was admitted with low output biventricular failure, cardiorenal syndrome, and marked volume overload. He is improved with dual inotrope support. Co-ox 64%, CVP 8-9.  He continues to diurese, weight down.  Creatinine stable at 1.74.  RHC yesterday showed improved filling pressures, good cardiac output on milrinone + norepinephrine.  He did not appear to have RV failure out of proportion to LV, CVP/PCWP was 0.56.  - Stop norepinephrine this morning.  Repeat co-ox, if still ok will start to titrate down milrinone.  - Add digoxin 0.0625 daily.  - Transition to torsemide 60 daily.  - Hold Entresto, Coreg, hydralazine/Imdur with soft BP. - Can add spironolactone 12.5 daily.   - He has had a very concerning  trajectory recently with worsening renal function and multiple admissions.  He is out of age range for transplant.  We have discussed LVAD. RV dysfunction and renal dysfunction are concerning, but RHC does suggest that RV can be optimized.  LV EF is in the range of 25-30%, somewhat  marginal for LVAD.  However, if he ends up dependent on milrinone, I think that LVAD is a reasonable step.  Will arrange for LVAD teaching today.  2. CAD: s/p CABG.  No signs/sx of ischemia. Continue statin.  Does not need ASA with stable CAD on apixaban.  He will need coronary angiography if he is to undergo LVAD in the future.  3. AKI on CKD stage III: Suspect cardiorenal syndrome in setting of low output failure/RV failure.  Renal function stabilized with inotropes. Renal US with medicorenal disease.  4. Atrial fibrillation: Chronic.  Rate is not elevated.  He is on apixaban.   5. Aortic stenosis s/p TAVR: Stable on echo.  6. 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 Keep K>4.0 Mg > 2.0.  I am going to transition to po amiodarone today.  Can stop amiodarone if milrinone stopped, continue if he goes home on milrinone.   35 minutes critical care time.   Loralie Champagne MD 12/03/2016, 7:43 AM  Advanced Heart Failure Team Pager [phone] (M-F; 7a - 4p)  Please contact [facility] for night-coverage after hours (4p -7a ) and weekends on [email or web address]

## 2016-12-03 NOTE — Progress Notes (Signed)
CSW and LVAD Coordinator met with patient and family at bedside to discuss LVAD work up. Patient and family asked appropriate questions and will follow up with [name]. CSW will follow up with patient outpatient to complete LVAD assessment. Raquel Sarna, [place], [place]

## 2016-12-03 NOTE — Progress Notes (Signed)
Pt states he doesn't always wear cpap at home; does not want to wear it here in the hospital. Encouraged pt to call if he changed his mind

## 2016-12-03 NOTE — Progress Notes (Signed)
1 Day Post-Op Procedure(s) (LRB): Right Heart Cath (N/A) Subjective: Patient examined, echo, RHC, CT chest personally reviewed Doing well on .[place] to be acceptable candidate for VAD, tricuspid valve repair Full consult to follow  Objective: Vital signs in last 24 hours: Temp:  [97.5 F (36.4 C)-98.7 F (37.1 C)] 98.7 F (37.1 C) (01/30 1600) Cardiac Rhythm: Atrial fibrillation (01/30 1600) Resp:  [12-20] 20 (01/30 1755) BP: (94-113)/(46-75) 110/65 (01/30 1755) SpO2:  [96 %-99 %] 99 % (01/30 1600) Weight:  [192 lb 4.8 oz (87.2 kg)] 192 lb 4.8 oz (87.2 kg) (01/30 0500)  Hemodynamic parameters for last 24 hours: CVP:  [9 mmHg-12 mmHg] 9 mmHg  Intake/Output from previous day: 01/29 0701 - 01/30 0700 In: 1264.9 [P.O.:600; I.V.:664.9] Out: 2200 [Urine:2200] Intake/Output this shift: Total I/O In: 989.4 [P.O.:840; I.V.:149.4] Out: 1000 [Urine:1000]       Exam    General- alert and comfortable   Lungs- clear without rales, wheezes   Cor-afib 2/6 systolic murmur , no gallop   Abdomen- soft, non-tender- large surgical scar   Extremities - warm, non-tender, minimal edema   Neuro- oriented, appropriate, no focal weakness   Lab Results:  Recent Labs  12/03/16 0500  WBC 6.6  HGB 8.6*  HCT 26.2*  PLT 96*   BMET:  Recent Labs  12/02/16 0445 12/03/16 0500  NA 134* 134*  K 3.0* 3.4*  CL 91* 89*  CO2 32 32  GLUCOSE 165* 146*  BUN 50* 46*  CREATININE 1.77* 1.79*  CALCIUM 8.8* 9.0    PT/INR:  Recent Labs  12/02/16 0900  LABPROT 19.4*  INR 1.61   ABG    Component Value Date/Time   PHART 7.501 (H) 12/02/2016 1055   HCO3 33.1 (H) 12/02/2016 1055   TCO2 34 12/02/2016 1055   O2SAT 63.1 12/03/2016 1120   CBG (last 3)   Recent Labs  12/03/16 0753 12/03/16 1204 12/03/16 1708  GLUCAP 148* 223* 202*    Assessment/Plan: S/P Procedure(s) (LRB): Right Heart Cath (N/A) Cont with full VAD eval   LOS: 5 days    Tharon Aquas Trigt  III 12/03/2016

## 2016-12-03 NOTE — Progress Notes (Signed)
Initial Encounter with LVAD Team and MCS Introduction:  Trevor [name] is a 71 y.o. male whom  has a past medical history of Asthma; CHF (congestive heart failure) ([place]); Diabetes ([place]); Kidney disease; and Sleep apnea.. We have been asked to evaluate the patient for advanced therapies which include Left Ventricular Assist Device implantation.   No results found for: ABORH   No results found for: HGBA1C   Lab Results  Component Value Date   CREATININE 1.79 (H) 12/03/2016   CREATININE 1.77 (H) 12/02/2016   CREATININE 1.91 (H) 12/01/2016    VAD educational packet including "HM II Patient Handbook", "HM II Left Ventricular Assist System" packet, and "[place] HM II Patient Education" reviewed in detail with me and left at bedside for continued reference. Patient education DVD was given to her as well for reference should she want to see more about the equipment at home after reviewing the information I left her.   Explained that LVAD can be implanted for two indications in the setting of advanced left ventricular heart failure treatment:  Bridge to transplant - used for patients who cannot safely wait for heart transplant without this device.  Or   Destination therapy - used for patients until end of life or recovery of heart function.  Discussed that at this point Trevor [name] would be considered for Destination therapy should he be deemed an acceptable VAD candidate.   Provided brief equipment overview of the HeartMate II pump and discussed placement, surgical procedure, peripheral equipment, life-long coumadin therapy, importance of medication adherence and clinic follow up for as long as patient is living on support, life-style modifications, as well as need for caregiver to be successful with this therapy.   The patient had good conversation during the encounter and asked appropriate questions. He had some baseline knowledge regarding the purpose for LVAD. We discussed  the process of the evaluation period and how a decision was made by the [facility] team whether he would be an appropriate candidate for therapy or not. Evaluation consent was reviewed and signed to begin evaluation process  Caregiver Support: sister, has a few cousins in the area  Home Inspection Checklist: verified that patient has reliable telephone, running water and electricity in the home.   Advised the patient review the materials, contact myself or other VAD Coordinators with questions and we will plan on meeting with her at next scheduled clinic appointment. Verbalized he/she would review the evaluation consent and make a decision regarding the evaluation process.   Planning to meet with LCSW and patient/sister this afternoon at 2:00 pm.   Session Time: 69 min   Trevor Madeira, RN [place] Coordinator   Office: [phone] 24/7 VAD Pager: [phone]

## 2016-12-03 NOTE — Progress Notes (Signed)
CARDIAC REHAB PHASE I   Attempted to ambulate with pt, pt eating lunch. Pt states he is meeting with VAD coordinator at 1400. Pt seen ambulating independently earlier today, states he will walk again later. Will follow up tomorrow.  Lenna Sciara, RN, BSN 12/03/2016 1320

## 2016-12-04 ENCOUNTER — Inpatient Hospital Stay (HOSPITAL_COMMUNITY): Payer: Medicare Other

## 2016-12-04 ENCOUNTER — Other Ambulatory Visit (HOSPITAL_COMMUNITY): Payer: Self-pay | Admitting: *Deleted

## 2016-12-04 DIAGNOSIS — I340 Nonrheumatic mitral (valve) insufficiency: Secondary | ICD-10-CM

## 2016-12-04 DIAGNOSIS — E109 Type 1 diabetes mellitus without complications: Secondary | ICD-10-CM

## 2016-12-04 DIAGNOSIS — I255 Ischemic cardiomyopathy: Secondary | ICD-10-CM

## 2016-12-04 DIAGNOSIS — Z01810 Encounter for preprocedural cardiovascular examination: Secondary | ICD-10-CM

## 2016-12-04 DIAGNOSIS — Z7189 Other specified counseling: Secondary | ICD-10-CM

## 2016-12-04 DIAGNOSIS — I2510 Atherosclerotic heart disease of native coronary artery without angina pectoris: Secondary | ICD-10-CM

## 2016-12-04 DIAGNOSIS — I361 Nonrheumatic tricuspid (valve) insufficiency: Secondary | ICD-10-CM

## 2016-12-04 DIAGNOSIS — N184 Chronic kidney disease, stage 4 (severe): Secondary | ICD-10-CM

## 2016-12-04 DIAGNOSIS — Z515 Encounter for palliative care: Secondary | ICD-10-CM

## 2016-12-04 LAB — COOXEMETRY PANEL
CARBOXYHEMOGLOBIN: 1.8 % — AB (ref 0.5–1.5)
Carboxyhemoglobin: 1.5 % (ref 0.5–1.5)
METHEMOGLOBIN: 1.3 % (ref 0.0–1.5)
Methemoglobin: 1.1 % (ref 0.0–1.5)
O2 SAT: 44.4 %
O2 SAT: 60.5 %
Total hemoglobin: 8.4 g/dL — ABNORMAL LOW (ref 12.0–16.0)
Total hemoglobin: 8.4 g/dL — ABNORMAL LOW (ref 12.0–16.0)

## 2016-12-04 LAB — HEPATIC FUNCTION PANEL
ALK PHOS: 113 U/L (ref 38–126)
ALT: 54 U/L (ref 17–63)
AST: 63 U/L — ABNORMAL HIGH (ref 15–41)
Albumin: 3.1 g/dL — ABNORMAL LOW (ref 3.5–5.0)
BILIRUBIN INDIRECT: 0.8 mg/dL (ref 0.3–0.9)
BILIRUBIN TOTAL: 1.1 mg/dL (ref 0.3–1.2)
Bilirubin, Direct: 0.3 mg/dL (ref 0.1–0.5)
Total Protein: 5.9 g/dL — ABNORMAL LOW (ref 6.5–8.1)

## 2016-12-04 LAB — CBC
HEMATOCRIT: 24.3 % — AB (ref 39.0–52.0)
HEMOGLOBIN: 8 g/dL — AB (ref 13.0–17.0)
MCH: 31.6 pg (ref 26.0–34.0)
MCHC: 32.9 g/dL (ref 30.0–36.0)
MCV: 96 fL (ref 78.0–100.0)
Platelets: 85 10*3/uL — ABNORMAL LOW (ref 150–400)
RBC: 2.53 MIL/uL — AB (ref 4.22–5.81)
RDW: 18.1 % — ABNORMAL HIGH (ref 11.5–15.5)
WBC: 5.8 10*3/uL (ref 4.0–10.5)

## 2016-12-04 LAB — BASIC METABOLIC PANEL
ANION GAP: 9 (ref 5–15)
BUN: 44 mg/dL — ABNORMAL HIGH (ref 6–20)
CHLORIDE: 89 mmol/L — AB (ref 101–111)
CO2: 32 mmol/L (ref 22–32)
CREATININE: 1.99 mg/dL — AB (ref 0.61–1.24)
Calcium: 8.5 mg/dL — ABNORMAL LOW (ref 8.9–10.3)
GFR calc non Af Amer: 32 mL/min — ABNORMAL LOW (ref >60)
GFR, EST AFRICAN AMERICAN: 37 mL/min — AB (ref >60)
Glucose, Bld: 154 mg/dL — ABNORMAL HIGH (ref 65–99)
POTASSIUM: 3.3 mmol/L — AB (ref 3.5–5.1)
SODIUM: 130 mmol/L — AB (ref 135–145)

## 2016-12-04 LAB — LIPID PANEL
CHOLESTEROL: 59 mg/dL (ref 0–200)
HDL: 30 mg/dL — ABNORMAL LOW (ref >40)
LDL Cholesterol: 15 mg/dL (ref 0–99)
Total CHOL/HDL Ratio: 2 RATIO
Triglycerides: 68 mg/dL (ref <150)
VLDL: 14 mg/dL (ref 0–40)

## 2016-12-04 LAB — HEMOGLOBIN A1C
Hgb A1c MFr Bld: 7.1 % — ABNORMAL HIGH (ref 4.8–5.6)
Mean Plasma Glucose: 157 mg/dL

## 2016-12-04 LAB — PSA: PSA: 0.6 ng/mL (ref 0.00–4.00)

## 2016-12-04 LAB — URIC ACID: URIC ACID, SERUM: 11.6 mg/dL — AB (ref 4.4–7.6)

## 2016-12-04 LAB — LACTATE DEHYDROGENASE: LDH: 201 U/L — ABNORMAL HIGH (ref 98–192)

## 2016-12-04 LAB — HIV ANTIBODY (ROUTINE TESTING W REFLEX): HIV SCREEN 4TH GENERATION: NONREACTIVE

## 2016-12-04 LAB — ANTITHROMBIN III: AntiThromb III Func: 85 % (ref 75–120)

## 2016-12-04 LAB — GLUCOSE, CAPILLARY
GLUCOSE-CAPILLARY: 136 mg/dL — AB (ref 65–99)
GLUCOSE-CAPILLARY: 179 mg/dL — AB (ref 65–99)
Glucose-Capillary: 181 mg/dL — ABNORMAL HIGH (ref 65–99)
Glucose-Capillary: 195 mg/dL — ABNORMAL HIGH (ref 65–99)

## 2016-12-04 LAB — PREALBUMIN: Prealbumin: 16.3 mg/dL — ABNORMAL LOW (ref 18–38)

## 2016-12-04 LAB — MAGNESIUM: MAGNESIUM: 1.8 mg/dL (ref 1.7–2.4)

## 2016-12-04 MED ORDER — POTASSIUM CHLORIDE CRYS ER 20 MEQ PO TBCR
40.0000 meq | EXTENDED_RELEASE_TABLET | Freq: Three times a day (TID) | ORAL | Status: AC
  Administered 2016-12-04 (×3): 40 meq via ORAL
  Filled 2016-12-04 (×3): qty 2

## 2016-12-04 MED ORDER — SODIUM CHLORIDE 0.9 % IV SOLN
510.0000 mg | Freq: Once | INTRAVENOUS | Status: AC
  Administered 2016-12-04: 510 mg via INTRAVENOUS
  Filled 2016-12-04: qty 17

## 2016-12-04 MED ORDER — AMIODARONE HCL 200 MG PO TABS
400.0000 mg | ORAL_TABLET | Freq: Two times a day (BID) | ORAL | Status: DC
  Administered 2016-12-04: 400 mg via ORAL
  Filled 2016-12-04: qty 2

## 2016-12-04 MED ORDER — GLUCERNA SHAKE PO LIQD
237.0000 mL | Freq: Two times a day (BID) | ORAL | Status: DC
  Administered 2016-12-04: 237 mL via ORAL
  Administered 2016-12-05: 1 via ORAL
  Administered 2016-12-06 – 2016-12-12 (×11): 237 mL via ORAL

## 2016-12-04 MED ORDER — AMIODARONE HCL 200 MG PO TABS
200.0000 mg | ORAL_TABLET | Freq: Once | ORAL | Status: AC
  Administered 2016-12-04: 200 mg via ORAL
  Filled 2016-12-04: qty 1

## 2016-12-04 MED ORDER — MAGNESIUM SULFATE 50 % IJ SOLN
3.0000 g | Freq: Once | INTRAMUSCULAR | Status: AC
  Administered 2016-12-04: 3 g via INTRAVENOUS
  Filled 2016-12-04: qty 6

## 2016-12-04 MED ORDER — NITROGLYCERIN 0.4 MG SL SUBL
SUBLINGUAL_TABLET | SUBLINGUAL | Status: AC
  Administered 2016-12-04: 13:00:00
  Filled 2016-12-04: qty 1

## 2016-12-04 MED ORDER — NITROGLYCERIN 0.4 MG SL SUBL
0.4000 mg | SUBLINGUAL_TABLET | SUBLINGUAL | Status: DC | PRN
  Administered 2016-12-04 – 2016-12-12 (×2): 0.4 mg via SUBLINGUAL
  Filled 2016-12-04: qty 1

## 2016-12-04 NOTE — Progress Notes (Signed)
Increased ectopy, reported 15 beat run of PVC's as well as widening QRS complex. Patient states he is feeling strange. Have paged Joesph July PA.

## 2016-12-04 NOTE — Progress Notes (Addendum)
Paged for 15 beats NSVT and malaise.  Pt noted lightheadedness and also is having some chest pressure.   K and Mg supp ordered. Can consider going back to amio drip if ectopy worsens.  Pt does have history of CAD.  MD ordered NTG given. Improving with second dose. Will follow closely. Will order EKG.    Legrand Como [address] Goodman, PA-C 12/04/2016 12:42 PM

## 2016-12-04 NOTE — Consult Note (Signed)
[address]       [place] [address]             [phone]        Xyon [name] Fiorella [facility] Medical Record [id number] Date of Birth: April 10, 1946  Referring: No ref. provider found Primary Care: No PCP Per Patient  Chief Complaint:    Chief Complaint  Patient presents with  . Chest Pain  . Leg Swelling  Patient examined, 2-D echocardiogram and CT scan of chest and right heart cath data personally reviewed and counseled with patient  History of Present Illness:     71 year old diabetic admitted with decompensated class IV heart failure from ischemic cardiomyopathy, ejection fraction 15% with moderate RV dysfunction, moderate to severe MR, moderate to severe TR. On admission his creatinine was elevated greater than 2 and he had CVP/wedge pressure ratio 0.55. He had low cardiac index and was placed on milrinone and norepinephrine was aggressively diuresed. He improved clinically with improved mixed venous saturation. His creatinine decreased from 2.2-1.7  The patient had multivessel CABG in [place] 4-5 years ago. He states he had a MI preceding the surgery. In 2015 he had a T aVR for aortic stenosis at which time his EF was low. The patient moved from [place] to this area to be with family when he developed decompensated heart failure.  Patient also has COPD-asthma, diabetes, sleep apnea, and had a major laparotomy at age 55 for unclear reasons described as 'twisted intestines.' His LFTs are adequate  The patient has had 2 AICDs and has atrial fibrillation on chronic anticoagulation without GI bleed history. Current Activity/ Functional Status: Patient currently lives alone but has assistance from sister and family he is retired Geophysicist/field seismologist   In the [place] Score: At the time of surgery this patient's most appropriate activity status/level should be described as: []     0    Normal activity, no symptoms []     1    Restricted in  physical strenuous activity but ambulatory, able to do out light work [x]     2    Ambulatory and capable of self care, unable to do work activities, up and about                 more than 50%  Of the time                            []     3    Only limited self care, in bed greater than 50% of waking hours []     4    Completely disabled, no self care, confined to bed or chair []     5    Moribund  Past Medical History:  Diagnosis Date  . Asthma   . CHF (congestive heart failure) ([place])   . Diabetes ([place])   . Kidney disease   . Sleep apnea     Past Surgical History:  Procedure Laterality Date  . CARDIAC CATHETERIZATION N/A 12/02/2016   Procedure: Right Heart Cath;  Surgeon: Larey Dresser, MD;  Location: [facility];  Service: Cardiovascular;  Laterality: N/A;    History  Smoking Status  . Never Smoker  Smokeless Tobacco  . Never Used    History  Alcohol Use No    Social History   Social History  . Marital status: Widowed    Spouse name:  N/A  . Number of children: N/A  . Years of education: N/A   Occupational History  . Not on file.   Social History Main Topics  . Smoking status: Never Smoker  . Smokeless tobacco: Never Used  . Alcohol use No  . Drug use: No  . Sexual activity: Not on file   Other Topics Concern  . Not on file   Social History Narrative   Patient is a widow. Moved to [place] from [place], [place]. Currently lives with his sister Allanmichael Preiser.     No Known Allergies  Current Facility-Administered Medications  Medication Dose Route Frequency Provider Last Rate Last Dose  . acetaminophen (TYLENOL) tablet 650 mg  650 mg Oral Q4H PRN Larey Dresser, MD      . albuterol (PROVENTIL) (2.5 MG/3ML) 0.083% nebulizer solution 2.5 mg  2.5 mg Inhalation Q4H PRN Etta Quill, DO      . amiodarone (PACERONE) tablet 400 mg  400 mg Oral BID Satira Mccallum Tillery, PA-C      . apixaban Arne Cleveland) tablet 5 mg  5 mg Oral BID Larey Dresser, MD    5 mg at 12/04/16 0908  . arformoterol (BROVANA) nebulizer solution 15 mcg  15 mcg Nebulization BID Etta Quill, DO   15 mcg at 12/04/16 0827  . atorvastatin (LIPITOR) tablet 40 mg  40 mg Oral Daily Etta Quill, DO   40 mg at 12/04/16 0909  . budesonide (PULMICORT) nebulizer solution 0.5 mg  0.5 mg Nebulization BID Etta Quill, DO   0.5 mg at 12/04/16 0827  . digoxin (LANOXIN) tablet 0.0625 mg  0.0625 mg Oral Daily Larey Dresser, MD   0.0625 mg at 12/04/16 0908  . feeding supplement (GLUCERNA SHAKE) (GLUCERNA SHAKE) liquid 237 mL  237 mL Oral BID BM Larey Dresser, MD   237 mL at 12/04/16 1900  . fluticasone (FLONASE) 50 MCG/ACT nasal spray 1 spray  1 spray Each Nare Daily Etta Quill, DO   1 spray at 12/04/16 0908  . insulin aspart (novoLOG) injection 0-9 Units  0-9 Units Subcutaneous TID WC Etta Quill, DO   2 Units at 12/04/16 1641  . insulin glargine (LANTUS) injection 10 Units  10 Units Subcutaneous QHS Etta Quill, DO   10 Units at 12/03/16 2151  . levothyroxine (SYNTHROID, LEVOTHROID) tablet 25 mcg  25 mcg Oral QAC breakfast Etta Quill, DO   25 mcg at 12/04/16 0818  . milrinone (PRIMACOR) 20 MG/100 ML (0.2 mg/mL) infusion  0.25 mcg/kg/min Intravenous Continuous Shirley Friar, PA-C 7 mL/hr at 12/04/16 1800 0.25 mcg/kg/min at 12/04/16 1800  . multivitamin with minerals tablet 1 tablet  1 tablet Oral Daily Etta Quill, DO   1 tablet at 12/04/16 0909  . nitroGLYCERIN (NITROSTAT) SL tablet 0.4 mg  0.4 mg Sublingual Q5 min PRN Larey Dresser, MD   0.4 mg at 12/04/16 1240  . ondansetron (ZOFRAN) injection 4 mg  4 mg Intravenous Q6H PRN Larey Dresser, MD      . potassium chloride SA (K-DUR,KLOR-CON) CR tablet 40 mEq  40 mEq Oral TID Satira Mccallum Tillery, PA-C   40 mEq at 12/04/16 1414  . sodium chloride flush (NS) 0.9 % injection 10-40 mL  10-40 mL Intracatheter PRN Larey Dresser, MD      . spironolactone (ALDACTONE) tablet 12.5 mg  12.5 mg Oral Daily  Larey Dresser, MD   12.5 mg at 12/04/16  [id number]  . torsemide (DEMADEX) tablet 60 mg  60 mg Oral Daily Larey Dresser, MD   60 mg at 12/04/16 0908  . traMADol (ULTRAM) tablet 50 mg  50 mg Oral Q6H PRN Etta Quill, DO   50 mg at 12/04/16 0557  . traZODone (DESYREL) tablet 50 mg  50 mg Oral QHS PRN Larey Dresser, MD   50 mg at 12/03/16 2357  . umeclidinium bromide (INCRUSE ELLIPTA) 62.5 MCG/INH 1 puff  1 puff Inhalation Daily Etta Quill, DO   1 puff at 12/03/16 [id number]    Prescriptions Prior to Admission  Medication Sig Dispense Refill Last Dose  . albuterol (PROVENTIL HFA;VENTOLIN HFA) 108 (90 Base) MCG/ACT inhaler Inhale 2 puffs into the lungs every 4 (four) hours as needed for wheezing or shortness of breath. 1 Inhaler 5 11/27/2016 at Unknown time  . albuterol (PROVENTIL) (2.5 MG/3ML) 0.083% nebulizer solution Take 3 mLs (2.5 mg total) by nebulization every 4 (four) hours. And as needed 150 mL 5 11/27/2016 at Unknown time  . apixaban (ELIQUIS) 2.5 MG TABS tablet Take 2.5 mg by mouth 2 (two) times daily.   11/27/2016 at 1900  . ascorbic acid (VITAMIN C) 1000 MG tablet Take 1,000 mg by mouth daily.   11/27/2016 at Unknown time  . aspirin EC 81 MG tablet Take 81 mg by mouth daily.   11/27/2016 at Unknown time  . atorvastatin (LIPITOR) 40 MG tablet Take 40 mg by mouth daily.   11/27/2016 at Unknown time  . benzonatate (TESSALON) 100 MG capsule Take by mouth 3 (three) times daily as needed for cough.   Past Week at Unknown time  . carvedilol (COREG) 6.25 MG tablet Take 6.25 mg by mouth 2 (two) times daily with a meal.   11/27/2016 at 1900  . fluticasone (FLONASE) 50 MCG/ACT nasal spray Place into both nostrils daily.   11/27/2016 at Unknown time  . fluticasone (FLOVENT HFA) 110 MCG/ACT inhaler Inhale 2 puffs into the lungs 2 (two) times daily. 1 Inhaler 5 11/27/2016 at Unknown time  . hydrALAZINE (APRESOLINE) 25 MG tablet Take 25 mg by mouth 3 (three) times daily.   11/27/2016 at Unknown time  . insulin  glargine (LANTUS) 100 UNIT/ML injection Inject 20 Units into the skin at bedtime.   11/27/2016 at Unknown time  . isosorbide dinitrate (ISORDIL) 30 MG tablet Take 15 mg by mouth 2 (two) times daily.   11/27/2016 at Unknown time  . isosorbide mononitrate (IMDUR) 30 MG 24 hr tablet Take 30 mg by mouth daily.   11/27/2016 at Unknown time  . levothyroxine (SYNTHROID, LEVOTHROID) 25 MCG tablet Take 25 mcg by mouth daily before breakfast.   11/27/2016 at Unknown time  . Multiple Vitamin (MULTIVITAMIN) tablet Take 1 tablet by mouth daily.   11/27/2016 at Unknown time  . potassium chloride SA (K-DUR,KLOR-CON) 20 MEQ tablet Take 20 mEq by mouth 2 (two) times daily.   11/27/2016 at Unknown time  . sacubitril-valsartan (ENTRESTO) 24-26 MG Take 1 tablet by mouth 2 (two) times daily.   11/27/2016 at Unknown time  . Tiotropium Bromide-Olodaterol (STIOLTO RESPIMAT) 2.5-2.5 MCG/ACT AERS Inhale 2 puffs into the lungs daily.   11/27/2016 at Unknown time  . torsemide (DEMADEX) 20 MG tablet Take 40 mg by mouth daily.   11/27/2016 at Unknown time  . traMADol (ULTRAM) 50 MG tablet Take 50 mg by mouth every 6 (six) hours as needed for moderate pain.    11/27/2016 at Unknown time  Family History  Problem Relation Age of Onset  . Heart failure Father   . Heart attack Father      Review of Systems:       Cardiac Review of Systems: Y or N  Chest Pain [  No  ]  Resting SOB [place].Spore   ] Exertional SOB  [place].Blacker ]  Orthopnea [ yes ]   Pedal Edema [ yes  ]    Palpitations [ yes ] Syncope  [ no ]   Presyncope [place].Blacker   ]  General Review of Systems: [Y] = yes [  ]=no Constitional: recent weight change [  ]; anorexia [  ]; fatigue [  ]; nausea [  ]; night sweats [  ]; fever [  ]; or chills [  ]                                                               Dental: poor dentition[  ]; Last Dentist visit: 6 months Panorex negative  Eye : blurred vision [  ]; diplopia [   ]; vision changes [  ];  Amaurosis fugax[  ]; Resp: cough [  ];   wheezing[  ];  hemoptysis[  ]; shortness of breath[ yes ]; paroxysmal nocturnal dyspnea[  ]; dyspnea on exertion yes[  ]; or orthopnea[ yes  ];  GI:  gallstones[  ], vomiting[  ];  dysphagia[  ]; melena[  ];  hematochezia [  ]; heartburn[  ];   Hx of  Colonoscopy[ remote  ]; GU: kidney stones [  ]; hematuria[  ];   dysuria [  ];  nocturia[  ];  history of     obstruction [  ]; urinary frequency [  ]             Skin: rash, swelling[  ];, hair loss[  ];  peripheral edema[ yes ];  or itching[  ]; Musculosketetal: myalgias[  ];  joint swelling[  ];  joint erythema[  ];  joint pain[  ];  back pain[  ];  Heme/Lymph: bruising[ yes ];  bleeding[  ];  anemia[  ];  Neuro: TIA[  ];  headaches[  ];  stroke[  ];  vertigo[  ];  seizures[  ];   paresthesias[  ];  difficulty walking[  ];  Psych:depression[  ]; anxiety[  ];  Endocrine: diabetes[ yes type II  ];  thyroid dysfunction[  ];  Immunizations: Flu [  ]; Pneumococcal[  ];  Other: Right-hand dominant  Physical Exam: BP 121/77 (BP Location: Left Arm)   Pulse 76   Temp 97.3 F (36.3 C) (Oral)   Resp 19   Ht 5\' 5"  (1.651 m)   Wt 196 lb 1.6 oz (89 kg)   SpO2 98%   BMI 32.63 kg/m        Physical Exam  General: Chronically ill-appearing Caucasian male no acute distress HEENT: Normocephalic pupils equal , dentition adequate Neck: Supple without JVD, adenopathy, or bruit Chest: Clear to auscultation, symmetrical breath sounds, no rhonchi, no tenderness well-healed sternal incision but without            deformity Cardiovascular: Regular rate and rhythm, 2/6 holosystolic murmur, no gallop, peripheral pulses  palpable in all extremities Abdomen:  Soft, nontender, no palpable mass or organomegaly.     Well-healed midline abdominal scar Extremities: Warm, well-perfused, no clubbing cyanosis, mild bilateral edema but without  tenderness,              no venous stasis changes of the legs Rectal/GU: Deferred Neuro: Grossly non--focal and  symmetrical throughout Skin: Clean and dry without rash or ulceration   Diagnostic Studies & Laboratory data:     Recent Radiology Findings:   Ct Abdomen Pelvis Wo Contrast  Result Date: 12/04/2016 CLINICAL DATA:  Inpatient with acute on chronic CHF, presenting for preoperative evaluation for LVAD. EXAM: CT ABDOMEN AND PELVIS WITHOUT CONTRAST TECHNIQUE: Multidetector CT imaging of the abdomen and pelvis was performed following the standard protocol without IV contrast. COMPARISON:  12/03/2016 chest CT. FINDINGS: Lower chest: Cardiomegaly. Coronary atherosclerosis. ICD lead tip is seen in the right ventricular apex. Partially visualized aortic valve prosthesis on the scout topogram. Trace left pleural effusion. Mild compressive atelectasis at the left lung base. Minimal patchy tree-in-bud opacities in the basilar right lower lobe. Hepatobiliary: Normal liver size. No liver mass. No convincing liver surface irregularity. Normal gallbladder with no radiopaque cholelithiasis. No biliary ductal dilatation. Pancreas: Normal, with no mass or duct dilation. Spleen: Normal size. No mass. Adrenals/Urinary Tract: Normal adrenals. No hydronephrosis. No renal stones. Hyperdense 0.4 cm renal cortical lesion in the anterior upper right kidney, too small to characterize, presumably a tiny hemorrhagic renal cyst, for which no further follow-up is required ( This recommendation follows ACR consensus guidelines: Management of the Incidental Renal Mass on CT: A White Paper of the ACR Incidental Findings Committee. J Am Coll Radiol 2017; article in press.). No contour deforming renal mass. Normal bladder. Stomach/Bowel: Grossly normal stomach. Normal caliber small bowel with no small bowel wall thickening. Appendix appears normal. Moderate sigmoid diverticulosis, with no large bowel wall thickening or pericolonic fat stranding. Oral contrast reaches the cecum. Vascular/Lymphatic: Atherosclerotic mildly tortuous nonaneurysmal  abdominal aorta. No pathologically enlarged lymph nodes in the abdomen or pelvis. Reproductive: Normal size prostate with nonspecific internal prostatic calcifications. Other: No pneumoperitoneum. Small volume ascites, predominantly perihepatic. No focal fluid collections. Musculoskeletal: No aggressive appearing focal osseous lesions. Moderate thoracolumbar spondylosis. IMPRESSION: 1. Cardiomegaly. 2. Trace left pleural effusion. 3. Small volume ascites. 4. Otherwise no acute abnormality in the abdomen or pelvis. 5. Additional findings include aortic atherosclerosis, coronary atherosclerosis and moderate sigmoid diverticulosis. Electronically Signed   By: Ilona Sorrel M.D.   On: 12/04/2016 07:26   Dg Orthopantogram  Result Date: 12/03/2016 CLINICAL DATA:  CHF EXAM: ORTHOPANTOGRAM/PANORAMIC COMPARISON:  None FINDINGS: Scattered dental fillings. Mandibular mineralization normal. No periodontal lucencies identified. TMJ alignment normal. IMPRESSION: No periodontal disease changes identified. Multiple dental fillings. Electronically Signed   By: Lavonia Dana M.D.   On: 12/03/2016 16:07   Ct Chest Wo Contrast  Result Date: 12/03/2016 CLINICAL DATA:  Patient for possible left ventricular assist device. Acute on chronic systolic CHF: Ischemic cardiomyopathy but with prominent RV dysfunction on echo and exam. Medtronic ICD. Echo this admission shows EF 25-30% with moderately dilated LV and moderately dilated/moderately dysfunction RV with moderate to severe TR. He was admitted with low output biventricular failure, cardiorenal syndrome, and marked volume overload. He is improved with dual inotrope support. Co-ox 64%, CVP 8-9. He continues to diurese, weight down. Creatinine stable at 1.74. RHC yesterday showed improved filling pressures, good cardiac output on milrinone + norepinephrine. He did not appear to have RV failure  out of proportion to LV, CVP/PCWP was 0.56. EXAM: CT CHEST WITHOUT CONTRAST TECHNIQUE:  Multidetector CT imaging of the chest was performed following the standard protocol without IV contrast. COMPARISON:  Chest radiograph, 11/28/2016 FINDINGS: Cardiovascular: Heart is mildly enlarged. There stable changes from prior cardiac surgery. There is a prosthetic aortic valve, well positioned. The wrist three-vessel coronary artery disease. Thoracic aorta normal in caliber. Minor atherosclerotic calcifications are noted along thoracic aorta and at the origin of the aortic arch branch vessels. Right anterior chest wall cardioverter-defibrillator is well positioned. Mediastinum/Nodes: No neck base or axillary masses or adenopathy. No mediastinal or hilar masses or enlarged lymph nodes. Trachea is widely patent. Esophagus is unremarkable. Lungs/Pleura: There are small areas of peripheral reticulonodular type opacities along peribronchovascular distributions in the peripheral right lower lobe. There is more confluent opacity in the posteromedial left lower lobe associated with a minimal pleural effusion and mild volume loss. Lung opacities consistent with atelectasis. There is no convincing pneumonia. No pulmonary edema. No right pleural effusion. No pneumothorax. Upper Abdomen: Small amount ascites collects adjacent to the liver and spleen. There are aortic and branch vessel calcifications. No acute findings. Musculoskeletal: No fracture or acute finding. Moderate-to-severe dextroscoliosis of the thoracic spine, apex at T9. IMPRESSION: 1. No acute abnormalities within the chest. No evidence of congestive heart failure/pulmonary edema. 2. Cardiomegaly. Changes from previous cardiac surgery and aortic valve replacement. Three-vessel coronary artery disease. 3. Left lower lobe atelectasis with the minimal pleural effusion. 4. Small peribronchovascular reticulonodular opacities noted in the right lower lobe, likely chronic. 5. Small amount ascites in the visible upper abdomen. Etiology of this is unclear.  Electronically Signed   By: Lajean Manes M.D.   On: 12/03/2016 16:01     I have independently reviewed the above radiologic studies.  Recent Lab Findings: Lab Results  Component Value Date   WBC 5.8 12/04/2016   HGB 8.0 (L) 12/04/2016   HCT 24.3 (L) 12/04/2016   PLT 85 (L) 12/04/2016   GLUCOSE 154 (H) 12/04/2016   CHOL 59 12/04/2016   TRIG 68 12/04/2016   HDL 30 (L) 12/04/2016   LDLCALC 15 12/04/2016   ALT 54 12/04/2016   AST 63 (H) 12/04/2016   NA 130 (L) 12/04/2016   K 3.3 (L) 12/04/2016   CL 89 (L) 12/04/2016   CREATININE 1.99 (H) 12/04/2016   BUN 44 (H) 12/04/2016   CO2 32 12/04/2016   TSH 3.621 12/03/2016   INR 1.61 12/02/2016   HGBA1C 7.1 (H) 12/03/2016      Assessment / Plan:      Severe ischemic cardiomyopathy with biventricular dysfunction, status post prior CABG followed by TAVR Chronic atrial fibrillation with pulmonary and moderate to severe TR  Acute on chronic renal insufficiency, mild protein in urine Sleep apnea, no history of smoking but with chronic asthmatic bronchitis  Patient appears to be an appropriate candidate for implantable VAD for destination therapy indication. He would be at increased risk because of the need for redo sternotomy, risk for postoperative dialysis-dependent renal failure and risk of postoperative RV failure. However VAD implant appears to be his best option for the chance at long-term quality survival. Will follow     @ME1 @ 12/04/2016 6:39 PM

## 2016-12-04 NOTE — Progress Notes (Signed)
Pre-op Cardiac Surgery  Carotid artery duplex completed.  Study was technically difficult due to line in right neck. Findings suggest 1-39% internal carotid artery stenosis bilaterally. Vertebral arteries are patent with antegrade flow.  Bilateral lower extremity venous duplex completed. Bilateral lower extremities are negative for deep vein thrombosis. There is no evidence of Baker's cyst bilaterally.  ABI  Lower  Extremity Right Left  Brachial Triphasic 123-Triphasic  Dorsalis Pedis Noncompressible- Dampened monophasic Noncompressible- Severely dampened monophasic  Anterior Tibial    Posterior Tibial 89- Severely dampened monophasic 98- Severely dampened monophasic  Ankle/Brachial Indices 0.72 0.80    Findings:   The right ABI is suggestive of moderate arterial insufficiency at rest. The left ABI is suggestive of mild, borderline moderate, arterial insufficiency at rest.  12/04/2016 3:42 PM Maudry Mayhew, BS, RVT, RDCS, RDMS

## 2016-12-04 NOTE — Progress Notes (Signed)
Initial Nutrition Assessment  DOCUMENTATION CODES:   Obesity unspecified  INTERVENTION:  Provide Glucerna Shake po BID, each supplement provides 220 kcal and 10 grams of protein.  Encourage adequate PO intake.   NUTRITION DIAGNOSIS:   Increased nutrient needs related to chronic illness as evidenced by estimated needs.  GOAL:   Patient will meet greater than or equal to 90% of their needs  MONITOR:   PO intake, Supplement acceptance, Labs, Weight trends, Skin, I & O's  REASON FOR ASSESSMENT:   Consult  (LVAD evaluation)  ASSESSMENT:   71 y.o. male with medical history significant of CHF, CKD, DM2, asthma. Patient presents to the ED with c/o worsening leg edema and SOB. Admitted with marked volume overload and low output HF. LVAD workup ongoing.  RD consulted for LVAD evaluation and to optimize nutrition prior to potential open heart operation. Meal completion has been 90-100%. Per RN, pt has been eating well at meals. Pt reports however having a decreased appetite which has been ongoing over the past 6 months. Pt reports consuming only ~50% of meals PTA. Pt does report consuming Boost shakes at home at least once daily. Noted pt with weight loss since admission, likely related to fluid status. Net I/O -5.9 L. RD to order nutritional supplements to aid in caloric and protein needs to optimize nutrition.   Nutrition-Focused physical exam completed. Findings are no fat depletion, no muscle depletion, and moderate edema.   Labs and medications reviewed. CBGs 136-187 mg/dL.  Diet Order:  Diet Heart Room service appropriate? Yes; Fluid consistency: Thin Diet NPO time specified  Skin:  Reviewed, no issues  Last BM:  1/29  Height:   Ht Readings from Last 1 Encounters:  11/28/16 5\' 5"  (1.651 m)    Weight:   Wt Readings from Last 1 Encounters:  12/04/16 196 lb 1.6 oz (89 kg)  Net I/O -5.9L since admit.  Ideal Body Weight:  61.8 kg  BMI:  Body mass index is 32.63  kg/m.  Estimated Nutritional Needs:   Kcal:  1900-2100  Protein:  105-115 grams  Fluid:  Per MD  EDUCATION NEEDS:   No education needs identified at this time  Corrin Parker, MS, RD, LDN Pager # [phone] After hours/ weekend pager # [phone]

## 2016-12-04 NOTE — Progress Notes (Signed)
Spoke with [name] and have given SL nitro. Joesph July PA also notified and he is here to see patient.

## 2016-12-04 NOTE — Progress Notes (Addendum)
Patient ID: Trevor [name], male   DOB: 04/14/1946, 71 y.o.   MRN: [id number]    Advanced Heart Failure Rounding Note   Subjective:    Admitted with marked volume overload and low output HF. BP soft so he was started on milrinone 0.25 mcg + norepi 3 mcg.   Coox 44.4% this morning OFF norepi and on milrinone 0.125 mcg/kg/min.   Feeling good this morning.   Creatinine 1.74 -> 1.99.  CVP 9  NSVT quiescent on amio  Echo: EF 25-30%, moderate LV dilation, bioprosthetic aortic valve ok, moderate MR, RV moderately dilated with moderate dysfunction, moderate to severe TR, PASP 51 mmHg.   Renal u/s with cortical thinning R 12.0 cm L 12.2 cm  RHC Procedural Findings (on milrinone 0.25 + norepinephrine 2): Hemodynamics (mmHg) RA mean 9 RV 52/11 PA 52/16, mean 31 PCWP mean 16 CVP/PCWP = 0.56 Oxygen saturations: PA 63% AO 95% Cardiac Output (Fick) 6.93  Cardiac Index (Fick) 3.55 PVR 2.1 WU Cardiac Output (Thermo) 6.16 Cardiac Index (Thermo) 3.16  PVR 2.4 WU  Objective:   Weight Range:  Vital Signs:   Temp:  [97.4 F (36.3 C)-98.7 F (37.1 C)] 97.4 F (36.3 C) (01/31 0825) Resp:  [11-20] 11 (01/31 0700) BP: (94-111)/(46-75) 106/70 (01/31 0825) SpO2:  [98 %-100 %] 98 % (01/31 0825) Weight:  [196 lb 1.6 oz (89 kg)] 196 lb 1.6 oz (89 kg) (01/31 0300) Last BM Date: 12/02/16  Weight change: Filed Weights   12/02/16 0500 12/03/16 0500 12/04/16 0300  Weight: 194 lb 0.1 oz (88 kg) 192 lb 4.8 oz (87.2 kg) 196 lb 1.6 oz (89 kg)    Intake/Output:   Intake/Output Summary (Last 24 hours) at 12/04/16 0916 Last data filed at 12/04/16 0700  Gross per 24 hour  Intake          1930.38 ml  Output             1275 ml  Net           655.38 ml     Physical Exam: CVP 9 General:  Elderly appearing. resp difficulty. Sitting in the chair  HEENT: normal.  Neck: supple. JVP 8-9. Carotids 2+ bilat; no bruits. No thyromegaly or nodule noted.  Cor: PMI nondisplaced. Irregular rate &  rhythm. 2/6 HSM LLSB, 2/6 SEM RUSB.  Lungs: Diminished basilar sounds, no crackles.  Abdomen: obese soft, NT, ND, no HSM. No bruits or masses. +BS  Extremities: no cyanosis, clubbing, rash, R and LLE trace to 1+ edema   Neuro: alert & orientedx3, cranial nerves grossly intact. moves all 4 extremities w/o difficulty. Affect pleasant  Telemetry: Reviewed,  A fib 70s-90s  Labs: Basic Metabolic Panel:  Recent Labs Lab 11/28/16 1104  11/29/16 0723 11/29/16 1300 11/30/16 0430 12/01/16 0405 12/02/16 0445 12/03/16 0500 12/04/16 0642  NA 138  < >  --  136 135 135 134* 134* 130*  K 3.9  < >  --  4.2 3.8 3.5 3.0* 3.4* 3.3*  CL 98*  < >  --  99* 95* 90* 91* 89* 89*  CO2 28  < >  --  28 27 31  32 32 32  GLUCOSE 119*  < >  --  229* 186* 193* 165* 146* 154*  BUN 97*  < >  --  74* 71* 59* 50* 46* 44*  CREATININE 2.26*  < >  --  1.77* 1.83* 1.91* 1.77* 1.79* 1.99*  CALCIUM 9.0  < >  --  8.9 9.0  9.0 8.8* 9.0 8.5*  MG 2.0  --  1.9 1.9  --   --   --  1.9  --   < > = values in this interval not displayed.  Liver Function Tests:  Recent Labs Lab 11/28/16 1104 12/04/16 0642  AST 46* 63*  ALT 34 54  ALKPHOS 119 113  BILITOT 1.0 1.1  PROT 6.5 5.9*  ALBUMIN 3.5 3.1*   No results for input(s): LIPASE, AMYLASE in the last 168 hours. No results for input(s): AMMONIA in the last 168 hours.  CBC:  Recent Labs Lab 11/28/16 1104 11/29/16 0800 11/30/16 0430 12/03/16 0500 12/04/16 0642  WBC 6.2 6.2 6.6 6.6 5.8  HGB 9.0* 8.6* 8.6* 8.6* 8.0*  HCT 27.8* 26.0* 25.9* 26.2* 24.3*  MCV 95.2 95.2 95.6 95.6 96.0  PLT 115* 118* 100* 96* 85*    Cardiac Enzymes: No results for input(s): CKTOTAL, CKMB, CKMBINDEX, TROPONINI in the last 168 hours.  BNP: BNP (last 3 results)  Recent Labs  11/28/16 1104  BNP 1,825.3*    ProBNP (last 3 results)  Recent Labs  11/27/16 1207  PROBNP 1,746.0*      Other results:  Imaging: Ct Abdomen Pelvis Wo Contrast  Result Date:  12/04/2016 CLINICAL DATA:  Inpatient with acute on chronic CHF, presenting for preoperative evaluation for LVAD. EXAM: CT ABDOMEN AND PELVIS WITHOUT CONTRAST TECHNIQUE: Multidetector CT imaging of the abdomen and pelvis was performed following the standard protocol without IV contrast. COMPARISON:  12/03/2016 chest CT. FINDINGS: Lower chest: Cardiomegaly. Coronary atherosclerosis. ICD lead tip is seen in the right ventricular apex. Partially visualized aortic valve prosthesis on the scout topogram. Trace left pleural effusion. Mild compressive atelectasis at the left lung base. Minimal patchy tree-in-bud opacities in the basilar right lower lobe. Hepatobiliary: Normal liver size. No liver mass. No convincing liver surface irregularity. Normal gallbladder with no radiopaque cholelithiasis. No biliary ductal dilatation. Pancreas: Normal, with no mass or duct dilation. Spleen: Normal size. No mass. Adrenals/Urinary Tract: Normal adrenals. No hydronephrosis. No renal stones. Hyperdense 0.4 cm renal cortical lesion in the anterior upper right kidney, too small to characterize, presumably a tiny hemorrhagic renal cyst, for which no further follow-up is required ( This recommendation follows ACR consensus guidelines: Management of the Incidental Renal Mass on CT: A White Paper of the ACR Incidental Findings Committee. J Am Coll Radiol 2017; article in press.). No contour deforming renal mass. Normal bladder. Stomach/Bowel: Grossly normal stomach. Normal caliber small bowel with no small bowel wall thickening. Appendix appears normal. Moderate sigmoid diverticulosis, with no large bowel wall thickening or pericolonic fat stranding. Oral contrast reaches the cecum. Vascular/Lymphatic: Atherosclerotic mildly tortuous nonaneurysmal abdominal aorta. No pathologically enlarged lymph nodes in the abdomen or pelvis. Reproductive: Normal size prostate with nonspecific internal prostatic calcifications. Other: No pneumoperitoneum.  Small volume ascites, predominantly perihepatic. No focal fluid collections. Musculoskeletal: No aggressive appearing focal osseous lesions. Moderate thoracolumbar spondylosis. IMPRESSION: 1. Cardiomegaly. 2. Trace left pleural effusion. 3. Small volume ascites. 4. Otherwise no acute abnormality in the abdomen or pelvis. 5. Additional findings include aortic atherosclerosis, coronary atherosclerosis and moderate sigmoid diverticulosis. Electronically Signed   By: Ilona Sorrel M.D.   On: 12/04/2016 07:26   Dg Orthopantogram  Result Date: 12/03/2016 CLINICAL DATA:  CHF EXAM: ORTHOPANTOGRAM/PANORAMIC COMPARISON:  None FINDINGS: Scattered dental fillings. Mandibular mineralization normal. No periodontal lucencies identified. TMJ alignment normal. IMPRESSION: No periodontal disease changes identified. Multiple dental fillings. Electronically Signed   By: Crist Infante.D.  On: 12/03/2016 16:07   Ct Chest Wo Contrast  Result Date: 12/03/2016 CLINICAL DATA:  Patient for possible left ventricular assist device. Acute on chronic systolic CHF: Ischemic cardiomyopathy but with prominent RV dysfunction on echo and exam. Medtronic ICD. Echo this admission shows EF 25-30% with moderately dilated LV and moderately dilated/moderately dysfunction RV with moderate to severe TR. He was admitted with low output biventricular failure, cardiorenal syndrome, and marked volume overload. He is improved with dual inotrope support. Co-ox 64%, CVP 8-9. He continues to diurese, weight down. Creatinine stable at 1.74. RHC yesterday showed improved filling pressures, good cardiac output on milrinone + norepinephrine. He did not appear to have RV failure out of proportion to LV, CVP/PCWP was 0.56. EXAM: CT CHEST WITHOUT CONTRAST TECHNIQUE: Multidetector CT imaging of the chest was performed following the standard protocol without IV contrast. COMPARISON:  Chest radiograph, 11/28/2016 FINDINGS: Cardiovascular: Heart is mildly enlarged.  There stable changes from prior cardiac surgery. There is a prosthetic aortic valve, well positioned. The wrist three-vessel coronary artery disease. Thoracic aorta normal in caliber. Minor atherosclerotic calcifications are noted along thoracic aorta and at the origin of the aortic arch branch vessels. Right anterior chest wall cardioverter-defibrillator is well positioned. Mediastinum/Nodes: No neck base or axillary masses or adenopathy. No mediastinal or hilar masses or enlarged lymph nodes. Trachea is widely patent. Esophagus is unremarkable. Lungs/Pleura: There are small areas of peripheral reticulonodular type opacities along peribronchovascular distributions in the peripheral right lower lobe. There is more confluent opacity in the posteromedial left lower lobe associated with a minimal pleural effusion and mild volume loss. Lung opacities consistent with atelectasis. There is no convincing pneumonia. No pulmonary edema. No right pleural effusion. No pneumothorax. Upper Abdomen: Small amount ascites collects adjacent to the liver and spleen. There are aortic and branch vessel calcifications. No acute findings. Musculoskeletal: No fracture or acute finding. Moderate-to-severe dextroscoliosis of the thoracic spine, apex at T9. IMPRESSION: 1. No acute abnormalities within the chest. No evidence of congestive heart failure/pulmonary edema. 2. Cardiomegaly. Changes from previous cardiac surgery and aortic valve replacement. Three-vessel coronary artery disease. 3. Left lower lobe atelectasis with the minimal pleural effusion. 4. Small peribronchovascular reticulonodular opacities noted in the right lower lobe, likely chronic. 5. Small amount ascites in the visible upper abdomen. Etiology of this is unclear. Electronically Signed   By: Lajean Manes M.D.   On: 12/03/2016 16:01     Medications:     Scheduled Medications: . amiodarone  200 mg Oral BID  . apixaban  5 mg Oral BID  . arformoterol  15 mcg  Nebulization BID  . atorvastatin  40 mg Oral Daily  . budesonide (PULMICORT) nebulizer solution  0.5 mg Nebulization BID  . digoxin  0.0625 mg Oral Daily  . fluticasone  1 spray Each Nare Daily  . insulin aspart  0-9 Units Subcutaneous TID WC  . insulin glargine  10 Units Subcutaneous QHS  . levothyroxine  25 mcg Oral QAC breakfast  . multivitamin with minerals  1 tablet Oral Daily  . spironolactone  12.5 mg Oral Daily  . torsemide  60 mg Oral Daily  . umeclidinium bromide  1 puff Inhalation Daily    Infusions: . milrinone 0.125 mcg/kg/min (12/03/16 1637)    PRN Medications: acetaminophen, albuterol, ondansetron (ZOFRAN) IV, sodium chloride flush, traMADol, traZODone   Assessment/Plan/Discussion    1. Acute on chronic systolic CHF: Ischemic cardiomyopathy but with prominent RV dysfunction on echo and exam.  Medtronic ICD.  Echo this  admission shows EF 25-30% with moderately dilated LV and moderately dilated/moderately dysfunction RV with moderate to severe TR.  He was admitted with low output biventricular failure, cardiorenal syndrome, and marked volume overload. He is improved with dual inotrope support.  He did not appear to have RV failure markedly out of proportion to LV on RHC, CVP/PCWP was 0.56 by RHC. Volume status stable on exam with CVP 9 (same as yesterday). Co-ox back down to 44% on lower milrinone and off norepinephrine.  - Continue torsemide 60 daily for now.  - Increase milrinone 0.25 mcg/kg/min and recheck coox.  - Continue digoxin 0.0625 daily. Will check level after has been on for ~ week.  - Hold Entresto and hydralazine/Imdur with soft BP. - No BB with low output.  - Continue spironolactone 12.5 daily.   - LVAD work up ongoing. Has seen Surgeon, HFSW, and VAD coordinator.  2. CAD: s/p CABG.  No signs/sx of ischemia. Continue statin.  Does not need ASA with stable CAD on apixaban. 3. AKI on CKD stage III: Suspect cardiorenal syndrome in setting of low output  failure/RV failure.  Renal function stabilized with inotropes. Renal US with medicorenal disease.  - Continue to follow closely.  4. Atrial fibrillation: Chronic.  Rate is not elevated. - Currently stable on eliquis.  May eventually need coumadin if proceed with LVAD.    5. Aortic stenosis s/p TAVR: Stable on echo.  6. 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 Keep K>4.0 Mg > 2.0.  - Continue po amiodarone.   7. Anemia: No overt bleeding.  Has had IV Fe this admission.   Shirley Friar, PA-C  12/04/2016, 9:16 AM  Advanced Heart Failure Team Pager [phone] (M-F; 7a - 4p)  Please contact [facility] for night-coverage after hours (4p -7a ) and weekends on [email or web address]  Patient seen with PA, agree with the above note.  Co-ox back down to 44% after decreasing milrinone to 0.125 and less UOP.   I am going to increase milrinone back to 0.25, looks like he is inotrope dependent at this point.  Repeat co-ox on higher milrinone dose.  Creatinine higher also on lower milrinone.  Will discuss timing and further workup for LVAD with Dr Prescott Gum.    Loralie Champagne 12/04/2016 9:59 AM

## 2016-12-04 NOTE — Progress Notes (Signed)
CARDIAC REHAB PHASE I   PRE:  Rate/Rhythm: 89 Afib wide ? BBB  BP:  Supine:   Sitting: 120/70  Standing:    SaO2: 100%RA  MODE:  Ambulation: 390 ft   POST:  Rate/Rhythm: 104 afib  Wide complex  BP:  Supine:   Sitting: 133/78  Standing:    SaO2: 99%RA 1100-1125 Pt ready to walk. Pt walked 390 ft independently with steady gait. Stopped twice to rest due to legs and SOB. Stated he felt more SOB today than yesterday. To recliner after walk. Pt stated he has had CHF ed before. Could answer teachback questions. Stated he had CHF booklet at home.  Graylon Good, RN BSN  12/04/2016 11:22 AM

## 2016-12-04 NOTE — Progress Notes (Signed)
Copy of patient's LHC from 2015 placed in shadow chart. Awaiting for Nov 2010 CABG op report as well as colonoscopy report/path for LVAD evaluation.   [name], RN VAD Coordinator   Office: [phone] 24/7 Emergency VAD Pager: [phone]

## 2016-12-05 ENCOUNTER — Inpatient Hospital Stay (HOSPITAL_COMMUNITY): Payer: Medicare Other

## 2016-12-05 DIAGNOSIS — Z95811 Presence of heart assist device: Secondary | ICD-10-CM

## 2016-12-05 HISTORY — DX: Presence of heart assist device: Z95.811

## 2016-12-05 LAB — GLUCOSE, CAPILLARY
GLUCOSE-CAPILLARY: 234 mg/dL — AB (ref 65–99)
GLUCOSE-CAPILLARY: 214 mg/dL — AB (ref 65–99)
Glucose-Capillary: 154 mg/dL — ABNORMAL HIGH (ref 65–99)
Glucose-Capillary: 224 mg/dL — ABNORMAL HIGH (ref 65–99)

## 2016-12-05 LAB — VAS US DOPPLER PRE VAD
LCCADDIAS: -12 cm/s
LCCAPSYS: 70 cm/s
LEFT ECA DIAS: -11 cm/s
LEFT VERTEBRAL DIAS: 17 cm/s
Left CCA dist sys: -49 cm/s
Left CCA prox dias: 16 cm/s
Left ICA dist dias: -23 cm/s
Left ICA dist sys: -76 cm/s
Left ICA prox dias: -28 cm/s
Left ICA prox sys: -61 cm/s
RCCADSYS: -53 cm/s
RCCAPDIAS: 7 cm/s
RCCAPSYS: 46 cm/s
RIGHT ECA DIAS: -6 cm/s

## 2016-12-05 LAB — HEPATITIS B SURFACE ANTIGEN: Hepatitis B Surface Ag: NEGATIVE

## 2016-12-05 LAB — COOXEMETRY PANEL
CARBOXYHEMOGLOBIN: 1.6 % — AB (ref 0.5–1.5)
Carboxyhemoglobin: 1.8 % — ABNORMAL HIGH (ref 0.5–1.5)
METHEMOGLOBIN: 1.5 % (ref 0.0–1.5)
METHEMOGLOBIN: 1.4 % (ref 0.0–1.5)
O2 SAT: 55.3 %
O2 Saturation: 71.1 %
TOTAL HEMOGLOBIN: 8.8 g/dL — AB (ref 12.0–16.0)
Total hemoglobin: 8.6 g/dL — ABNORMAL LOW (ref 12.0–16.0)

## 2016-12-05 LAB — HEPARIN LEVEL (UNFRACTIONATED)

## 2016-12-05 LAB — BASIC METABOLIC PANEL
ANION GAP: 12 (ref 5–15)
BUN: 44 mg/dL — ABNORMAL HIGH (ref 6–20)
CALCIUM: 9.2 mg/dL (ref 8.9–10.3)
CO2: 29 mmol/L (ref 22–32)
CREATININE: 2.2 mg/dL — AB (ref 0.61–1.24)
Chloride: 89 mmol/L — ABNORMAL LOW (ref 101–111)
GFR, EST AFRICAN AMERICAN: 33 mL/min — AB (ref >60)
GFR, EST NON AFRICAN AMERICAN: 28 mL/min — AB (ref >60)
Glucose, Bld: 176 mg/dL — ABNORMAL HIGH (ref 65–99)
Potassium: 4.3 mmol/L (ref 3.5–5.1)
SODIUM: 130 mmol/L — AB (ref 135–145)

## 2016-12-05 LAB — CBC WITH DIFFERENTIAL/PLATELET
BASOS ABS: 0 10*3/uL (ref 0.0–0.1)
BASOS PCT: 0 %
EOS ABS: 0.2 10*3/uL (ref 0.0–0.7)
Eosinophils Relative: 3 %
HEMATOCRIT: 26.1 % — AB (ref 39.0–52.0)
Hemoglobin: 8.5 g/dL — ABNORMAL LOW (ref 13.0–17.0)
Lymphocytes Relative: 20 %
Lymphs Abs: 1.4 10*3/uL (ref 0.7–4.0)
MCH: 31.1 pg (ref 26.0–34.0)
MCHC: 32.6 g/dL (ref 30.0–36.0)
MCV: 95.6 fL (ref 78.0–100.0)
MONO ABS: 0.7 10*3/uL (ref 0.1–1.0)
MONOS PCT: 10 %
NEUTROS ABS: 4.6 10*3/uL (ref 1.7–7.7)
Neutrophils Relative %: 67 %
Platelets: 98 10*3/uL — ABNORMAL LOW (ref 150–400)
RBC: 2.73 MIL/uL — ABNORMAL LOW (ref 4.22–5.81)
RDW: 17.7 % — AB (ref 11.5–15.5)
WBC: 6.9 10*3/uL (ref 4.0–10.5)

## 2016-12-05 LAB — PULMONARY FUNCTION TEST
DL/VA % pred: 109 %
DL/VA: 4.63 ml/min/mmHg/L
DLCO COR % PRED: 36 %
DLCO UNC % PRED: 29 %
DLCO UNC: 7.44 ml/min/mmHg
DLCO cor: 9.46 ml/min/mmHg
FEF 25-75 Pre: 0.5 L/sec
FEF2575-%Pred-Pre: 25 %
FEV1-%Pred-Pre: 45 %
FEV1-Pre: 1.18 L
FEV1FVC-%Pred-Pre: 83 %
FEV6-%Pred-Pre: 54 %
FEV6-Pre: 1.81 L
FEV6FVC-%Pred-Pre: 99 %
FVC-%Pred-Pre: 54 %
FVC-PRE: 1.94 L
PRE FEV1/FVC RATIO: 61 %
PRE FEV6/FVC RATIO: 94 %

## 2016-12-05 LAB — HEPATITIS C ANTIBODY: HCV Ab: <0.1 s/co ratio (ref 0.0–0.9)

## 2016-12-05 LAB — APTT
APTT: 154 s — AB (ref 24–36)
aPTT: 36 seconds (ref 24–36)

## 2016-12-05 LAB — LUPUS ANTICOAGULANT PANEL
DRVVT: 83.3 s — AB (ref 0.0–47.0)
PTT Lupus Anticoagulant: 49.3 s (ref 0.0–51.9)

## 2016-12-05 LAB — DRVVT MIX: DRVVT MIX: 59.6 s — AB (ref 0.0–47.0)

## 2016-12-05 LAB — HEPATITIS B SURFACE ANTIBODY,QUALITATIVE: HEP B S AB: NONREACTIVE

## 2016-12-05 LAB — DRVVT CONFIRM: DRVVT CONFIRM: 0.7 ratio — AB (ref 0.8–1.2)

## 2016-12-05 LAB — HEPATITIS B CORE ANTIBODY, TOTAL: Hep B Core Total Ab: NEGATIVE

## 2016-12-05 MED ORDER — FUROSEMIDE 10 MG/ML IJ SOLN
80.0000 mg | Freq: Two times a day (BID) | INTRAMUSCULAR | Status: DC
  Administered 2016-12-05 – 2016-12-12 (×16): 80 mg via INTRAVENOUS
  Filled 2016-12-05 (×16): qty 8

## 2016-12-05 MED ORDER — AMIODARONE HCL 200 MG PO TABS
200.0000 mg | ORAL_TABLET | Freq: Two times a day (BID) | ORAL | Status: DC
  Administered 2016-12-05 – 2016-12-12 (×16): 200 mg via ORAL
  Filled 2016-12-05 (×16): qty 1

## 2016-12-05 MED ORDER — HEPARIN (PORCINE) IN NACL 100-0.45 UNIT/ML-% IJ SOLN
950.0000 [IU]/h | INTRAMUSCULAR | Status: DC
  Administered 2016-12-06 – 2016-12-07 (×2): 950 [IU]/h via INTRAVENOUS
  Filled 2016-12-05 (×2): qty 250

## 2016-12-05 MED ORDER — HEPARIN (PORCINE) IN NACL 100-0.45 UNIT/ML-% IJ SOLN
1200.0000 [IU]/h | INTRAMUSCULAR | Status: DC
  Administered 2016-12-05: 1200 [IU]/h via INTRAVENOUS
  Filled 2016-12-05: qty 250

## 2016-12-05 MED ORDER — METOLAZONE 2.5 MG PO TABS
2.5000 mg | ORAL_TABLET | Freq: Once | ORAL | Status: AC
  Administered 2016-12-05: 2.5 mg via ORAL
  Filled 2016-12-05: qty 1

## 2016-12-05 NOTE — Consult Note (Signed)
Consultation Note Date: 12/04/2016  Patient Name: Trevor [name]  DOB: 11/24/1945  MRN: [id number]  Age / Sex: 71 y.o., male  PCP: No Pcp Per Patient Referring Physician: Larey Dresser, MD  Reason for Consultation: VAD eval  HPI/Patient Profile: 71 y.o. male  with past medical history of CAD s/p CABG 2010, AVR TAVR 3159, systolic CHF (EF 45-85%), CKD, DM2, asthma, and recent admission in [place] for "cardiorenal syndrome" but renal function recovered much more than his medical team anticipated per patient report . He was admitted on 11/27/2016 with chest pain, SOB, and worsening leg swelling. EF 20-25% now and unable to wean milrinone d/t worsening renal function. Now considering VAD.   Clinical Assessment and Goals of Care: I met today with Trevor [name] for VAD evaluation. No family at bedside. [name] is very open to conversation and appears to have good insight into VAD process and consequences. He tells me that he is thoughtfully considering his options. He understands that he likely has very limited time without VAD but also that there are no guarantees of what his QOL or quantity of life will be after VAD (although the hope is that both quality and quantity will greatly improve. He has thought about dying and does not feel fear about dying and acknowledges this is an inevitable outcome. We were able to talk about VAD process and recovery, expectations and hopes, aggressive care vs comfort path, as well as possibility of turning off VAD and when this may be appropriate. He hopes to have better QOL where he is less fatigued and able to get out of the house and enjoy his life more.   He has recently moved here from [place] where he lived with his wife who died from a long battle with cancer ~3 years ago. 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 He has moved here to live with his  sister and focus on his health. His sister is planning on hip replacement surgery soon and his other sister from [place] has come to assist them both with their health. He says that his sisters are very supportive. He has no children. We also discussed the VAD support group as he just moved here and all his friends live back in [place] and his support team is small at current time (his 2 sisters). He says that he has an Living Will but may need to update. Educated that we can assist with this while hospitalized. Names his sister [name] as surrogate.   Primary Decision Maker PATIENT    SUMMARY OF RECOMMENDATIONS   - I believe Trevor [name] is an excellent candidate for VAD from palliative perspective. He is thoughtful, rationale, and insightful into his hopes with VAD and with VAD process.   Code Status/Advance Care Planning:  Full code but he says he would not want this only for short time and not to prolong his dying   Symptom Management:   Only complaint is leg pain and swelling. He has  been walking often in room. Diurese per heart failure team. Elevate legs when sitting. Continue tramadol prn.   Palliative Prophylaxis:   Bowel Regimen and Frequent Pain Assessment  Additional Recommendations (Limitations, Scope, Preferences):  Full Scope Treatment  Psycho-social/Spiritual:   Desire for further Chaplaincy support:no  Additional Recommendations: Caregiving  Support/Resources  Prognosis:   Unable to determine  Discharge Planning: To Be Determined      Primary Diagnoses: Present on Admission: . Acute on chronic systolic CHF (congestive heart failure) ([place]) . Cardiomyopathy, ischemic . Acute on chronic kidney failure ([place]) . Asthma, chronic . Cardiorenal syndrome with renal failure . Acute on chronic systolic CHF (congestive heart failure), NYHA class 4 ([place])   I have reviewed the medical record, interviewed the patient and family, and examined the patient. The following  aspects are pertinent.  Past Medical History:  Diagnosis Date  . Asthma   . CHF (congestive heart failure) ([place])   . Diabetes ([place])   . Kidney disease   . Sleep apnea    Social History   Social History  . Marital status: Widowed    Spouse name: N/A  . Number of children: N/A  . Years of education: N/A   Social History Main Topics  . Smoking status: Never Smoker  . Smokeless tobacco: Never Used  . Alcohol use No  . Drug use: No  . Sexual activity: Not Asked   Other Topics Concern  . None   Social History Narrative   Patient is a widow. Moved to [place] from [place], [place]. Currently lives with his sister Trevor [name].    Family History  Problem Relation Age of Onset  . Heart failure Father   . Heart attack Father    Scheduled Meds: . amiodarone  200 mg Oral BID  . apixaban  5 mg Oral BID  . arformoterol  15 mcg Nebulization BID  . atorvastatin  40 mg Oral Daily  . budesonide (PULMICORT) nebulizer solution  0.5 mg Nebulization BID  . digoxin  0.0625 mg Oral Daily  . feeding supplement (GLUCERNA SHAKE)  237 mL Oral BID BM  . fluticasone  1 spray Each Nare Daily  . furosemide  80 mg Intravenous BID  . insulin aspart  0-9 Units Subcutaneous TID WC  . insulin glargine  10 Units Subcutaneous QHS  . levothyroxine  25 mcg Oral QAC breakfast  . metolazone  2.5 mg Oral Once  . multivitamin with minerals  1 tablet Oral Daily  . umeclidinium bromide  1 puff Inhalation Daily   Continuous Infusions: . milrinone 0.375 mcg/kg/min (12/05/16 0800)   PRN Meds:.acetaminophen, albuterol, nitroGLYCERIN, ondansetron (ZOFRAN) IV, sodium chloride flush, traMADol, traZODone No Known Allergies Review of Systems  Constitutional: Positive for activity change, appetite change and fatigue.  Cardiovascular: Positive for chest pain and leg swelling.    Physical Exam  Constitutional: He is oriented to person, place, and time. He appears well-developed.  HENT:  Head:  Normocephalic and atraumatic.  Cardiovascular: Normal rate.  An irregularly irregular rhythm present.  Pulmonary/Chest: Effort normal and breath sounds normal. No accessory muscle usage. No tachypnea. No respiratory distress.  Abdominal: Soft. Normal appearance.  Neurological: He is alert and oriented to person, place, and time.  Nursing note and vitals reviewed.   Vital Signs: BP (!) 109/91 (BP Location: Left Arm)   Pulse 89   Temp 98.4 F (36.9 C) (Oral)   Resp 16   Ht 5' 5"  (1.651 m)   Wt 89.3 kg (  196 lb 12.8 oz)   SpO2 98%   BMI 32.75 kg/m  Pain Assessment: No/denies pain   Pain Score: 3    SpO2: SpO2: 98 % O2 Device:SpO2: 98 % O2 Flow Rate: .O2 Flow Rate (L/min): 0.5 L/min  IO: Intake/output summary:  Intake/Output Summary (Last 24 hours) at 12/05/16 0834 Last data filed at 12/05/16 0600  Gross per 24 hour  Intake           632.37 ml  Output             1000 ml  Net          -367.63 ml    LBM: Last BM Date: 12/02/16 Baseline Weight: Weight: 93.7 kg (206 lb 9.1 oz) Most recent weight: Weight: 89.3 kg (196 lb 12.8 oz)     Palliative Assessment/Data:   Flowsheet Rows   Flowsheet Row Most Recent Value  Intake Tab  Referral Department  Cardiology  Unit at Time of Referral  ICU  Palliative Care Primary Diagnosis  Cardiac  Date Notified  12/03/16  Palliative Care Type  New Palliative care  Reason for referral  Clarify Goals of Care, Other (Comment) [VAD Eval]  Date of Admission  11/27/16  # of days IP prior to Palliative referral  6  Clinical Assessment  Psychosocial & Spiritual Assessment  Palliative Care Outcomes      Time Total: 80 min  Greater than 50%  of this time was spent counseling and coordinating care related to the above assessment and plan.  Signed by: Vinie Sill, NP Palliative Medicine Team Pager # [phone] (M-F 8a-5p) Team Phone # [phone] (Nights/Weekends)

## 2016-12-05 NOTE — Progress Notes (Signed)
CARDIAC REHAB PHASE I   PRE:  Rate/Rhythm: 78 afib    BP: sitting 104/63    SaO2: 97 RA  MODE:  Ambulation: 330 ft   POST:  Rate/Rhythm: 103 afib    BP: sitting 122/75     SaO2: 97 RA  Pt with less energy today. Also feeling depressed. Willing to walk, rested x3 due to fatigue. VSS. Encouraged IS and more walking.  [id number]   Sells, ACSM 12/05/2016 11:53 AM

## 2016-12-05 NOTE — Progress Notes (Signed)
[place] for apixaban>>heparin Indication: atrial fibrillation  No Known Allergies  Patient Measurements: Height: 5\' 5"  (165.1 cm) Weight: 196 lb 12.8 oz (89.3 kg) IBW/kg (Calculated) : 61.5 Heparin Dosing Weight: 88kg  Vital Signs: Temp: 97.8 F (36.6 C) (02/01 1900) Temp Source: Oral (02/01 1900) BP: 106/63 (02/01 2007) Pulse Rate: 79 (02/01 1702)  Labs:  Recent Labs  12/03/16 0500 12/04/16 0642 12/05/16 0400 12/05/16 1108 12/05/16 2013  HGB 8.6* 8.0* 8.5*  --   --   HCT 26.2* 24.3* 26.1*  --   --   PLT 96* 85* 98*  --   --   APTT  --   --   --  36 154*  HEPARINUNFRC  --   --   --  >2.20*  --   CREATININE 1.79* 1.99* 2.20*  --   --     Estimated Creatinine Clearance: 31.6 mL/min (by C-G formula based on SCr of 2.2 mg/dL (H)).   Medical History: Past Medical History:  Diagnosis Date  . Asthma   . CHF (congestive heart failure) ([place])   . Diabetes ([place])   . Kidney disease   . Sleep apnea    Assessment: 71 y.o. male with medical history significant of CHF, CKD, DM2, asthma. Patient here for heart failure and volume overload. He is on apixaban prior to admission and this has been continued while hospitalized, his last dose was last night 1/31. Patient now undergoing LVAD workup and decision was made to transition to heparin prior to surgery, will start this afternoon.   Heparin levels will not be accurate d/t recent direct Xa inhibitor use so will monitor and adjust based on aptt's.  Initial aptt 154 on 1200 units/hr - no bleeding/issues per RN   Goal of Therapy:  aPTT 66-102 seconds Monitor platelets by anticoagulation protocol: Yes   Plan:  Hold hep x 1 hr  Restart heparin at 950 units/hr Next lvls with am labs (aptt/HL/cbc)  Levester Fresh, PharmD, BCPS, BCCCP Clinical Pharmacist 12/05/2016 9:53 PM

## 2016-12-05 NOTE — Progress Notes (Signed)
Patient ID: Myson Pense, male   DOB: 13-Jun-1946, 71 y.o.   MRN: [id number]    Advanced Heart Failure Rounding Note   Subjective:    Admitted with marked volume overload and low output HF.  BP soft so he was started on milrinone 0.25 mcg + norepi 3 mcg.   Coox 44.4% 1/31 OFF norepi and on milrinone 0.125 mcg/kg/min. Milrinone increased to 0.25, co-ox 55% this morning.   Creatinine 1.74 -> 1.99 -> 2.2.  CVP 17.   Short run 6 beats NSVT on telemetry yesterday.   Echo: EF 25-30%, moderate LV dilation, bioprosthetic aortic valve ok, moderate MR, RV moderately dilated with moderate dysfunction, moderate to severe TR, PASP 51 mmHg.   Renal u/s with cortical thinning R 12.0 cm L 12.2 cm  RHC Procedural Findings (on milrinone 0.25 + norepinephrine 2): Hemodynamics (mmHg) RA mean 9 RV 52/11 PA 52/16, mean 31 PCWP mean 16 CVP/PCWP = 0.56 Oxygen saturations: PA 63% AO 95% Cardiac Output (Fick) 6.93  Cardiac Index (Fick) 3.55 PVR 2.1 WU Cardiac Output (Thermo) 6.16 Cardiac Index (Thermo) 3.16  PVR 2.4 WU  Objective:   Weight Range:  Vital Signs:   Temp:  [97.3 F (36.3 C)-98.5 F (36.9 C)] 98.4 F (36.9 C) (02/01 0300) Pulse Rate:  [75] 75 (02/01 0717) Resp:  [11-20] 16 (02/01 0717) BP: (101-121)/(61-77) 101/61 (02/01 0300) SpO2:  [98 %] 98 % (01/31 1956) Weight:  [196 lb 12.8 oz (89.3 kg)] 196 lb 12.8 oz (89.3 kg) (02/01 0500) Last BM Date: 12/02/16  Weight change: Filed Weights   12/03/16 0500 12/04/16 0300 12/05/16 0500  Weight: 192 lb 4.8 oz (87.2 kg) 196 lb 1.6 oz (89 kg) 196 lb 12.8 oz (89.3 kg)    Intake/Output:   Intake/Output Summary (Last 24 hours) at 12/05/16 0747 Last data filed at 12/05/16 0600  Gross per 24 hour  Intake           632.37 ml  Output             1000 ml  Net          -367.63 ml     Physical Exam: CVP 9 General:  Elderly appearing. resp difficulty. Sitting in the chair  HEENT: normal.  Neck: supple. JVP 8-9. Carotids 2+  bilat; no bruits. No thyromegaly or nodule noted.  Cor: PMI nondisplaced. Irregular rate & rhythm. 2/6 HSM LLSB, 2/6 SEM RUSB.  Lungs: Diminished basilar sounds, no crackles.  Abdomen: obese soft, NT, ND, no HSM. No bruits or masses. +BS  Extremities: no cyanosis, clubbing, rash, R and LLE trace to 1+ edema   Neuro: alert & orientedx3, cranial nerves grossly intact. moves all 4 extremities w/o difficulty. Affect pleasant  Telemetry: Reviewed,  A fib 70s-90s  Labs: Basic Metabolic Panel:  Recent Labs Lab 11/28/16 1104  11/29/16 0723 11/29/16 1300  12/01/16 0405 12/02/16 0445 12/03/16 0500 12/04/16 0642 12/04/16 0946 12/05/16 0400  NA 138  < >  --  136  < > 135 134* 134* 130*  --  130*  K 3.9  < >  --  4.2  < > 3.5 3.0* 3.4* 3.3*  --  4.3  CL 98*  < >  --  99*  < > 90* 91* 89* 89*  --  89*  CO2 28  < >  --  28  < > 31 32 32 32  --  29  GLUCOSE 119*  < >  --  229*  < >  193* 165* 146* 154*  --  176*  BUN 97*  < >  --  74*  < > 59* 50* 46* 44*  --  44*  CREATININE 2.26*  < >  --  1.77*  < > 1.91* 1.77* 1.79* 1.99*  --  2.20*  CALCIUM 9.0  < >  --  8.9  < > 9.0 8.8* 9.0 8.5*  --  9.2  MG 2.0  --  1.9 1.9  --   --   --  1.9  --  1.8  --   < > = values in this interval not displayed.  Liver Function Tests:  Recent Labs Lab 11/28/16 1104 12/04/16 0642  AST 46* 63*  ALT 34 54  ALKPHOS 119 113  BILITOT 1.0 1.1  PROT 6.5 5.9*  ALBUMIN 3.5 3.1*   No results for input(s): LIPASE, AMYLASE in the last 168 hours. No results for input(s): AMMONIA in the last 168 hours.  CBC:  Recent Labs Lab 11/29/16 0800 11/30/16 0430 12/03/16 0500 12/04/16 0642 12/05/16 0400  WBC 6.2 6.6 6.6 5.8 6.9  NEUTROABS  --   --   --   --  4.6  HGB 8.6* 8.6* 8.6* 8.0* 8.5*  HCT 26.0* 25.9* 26.2* 24.3* 26.1*  MCV 95.2 95.6 95.6 96.0 95.6  PLT 118* 100* 96* 85* 98*    Cardiac Enzymes: No results for input(s): CKTOTAL, CKMB, CKMBINDEX, TROPONINI in the last 168 hours.  BNP: BNP (last 3  results)  Recent Labs  11/28/16 1104  BNP 1,825.3*    ProBNP (last 3 results)  Recent Labs  11/27/16 1207  PROBNP 1,746.0*      Other results:  Imaging: Ct Abdomen Pelvis Wo Contrast  Result Date: 12/04/2016 CLINICAL DATA:  Inpatient with acute on chronic CHF, presenting for preoperative evaluation for LVAD. EXAM: CT ABDOMEN AND PELVIS WITHOUT CONTRAST TECHNIQUE: Multidetector CT imaging of the abdomen and pelvis was performed following the standard protocol without IV contrast. COMPARISON:  12/03/2016 chest CT. FINDINGS: Lower chest: Cardiomegaly. Coronary atherosclerosis. ICD lead tip is seen in the right ventricular apex. Partially visualized aortic valve prosthesis on the scout topogram. Trace left pleural effusion. Mild compressive atelectasis at the left lung base. Minimal patchy tree-in-bud opacities in the basilar right lower lobe. Hepatobiliary: Normal liver size. No liver mass. No convincing liver surface irregularity. Normal gallbladder with no radiopaque cholelithiasis. No biliary ductal dilatation. Pancreas: Normal, with no mass or duct dilation. Spleen: Normal size. No mass. Adrenals/Urinary Tract: Normal adrenals. No hydronephrosis. No renal stones. Hyperdense 0.4 cm renal cortical lesion in the anterior upper right kidney, too small to characterize, presumably a tiny hemorrhagic renal cyst, for which no further follow-up is required ( This recommendation follows ACR consensus guidelines: Management of the Incidental Renal Mass on CT: A White Paper of the ACR Incidental Findings Committee. J Am Coll Radiol 2017; article in press.). No contour deforming renal mass. Normal bladder. Stomach/Bowel: Grossly normal stomach. Normal caliber small bowel with no small bowel wall thickening. Appendix appears normal. Moderate sigmoid diverticulosis, with no large bowel wall thickening or pericolonic fat stranding. Oral contrast reaches the cecum. Vascular/Lymphatic: Atherosclerotic mildly  tortuous nonaneurysmal abdominal aorta. No pathologically enlarged lymph nodes in the abdomen or pelvis. Reproductive: Normal size prostate with nonspecific internal prostatic calcifications. Other: No pneumoperitoneum. Small volume ascites, predominantly perihepatic. No focal fluid collections. Musculoskeletal: No aggressive appearing focal osseous lesions. Moderate thoracolumbar spondylosis. IMPRESSION: 1. Cardiomegaly. 2. Trace left pleural effusion. 3. Small volume ascites. 4.  Otherwise no acute abnormality in the abdomen or pelvis. 5. Additional findings include aortic atherosclerosis, coronary atherosclerosis and moderate sigmoid diverticulosis. Electronically Signed   By: Ilona Sorrel M.D.   On: 12/04/2016 07:26   Dg Orthopantogram  Result Date: 12/03/2016 CLINICAL DATA:  CHF EXAM: ORTHOPANTOGRAM/PANORAMIC COMPARISON:  None FINDINGS: Scattered dental fillings. Mandibular mineralization normal. No periodontal lucencies identified. TMJ alignment normal. IMPRESSION: No periodontal disease changes identified. Multiple dental fillings. Electronically Signed   By: Lavonia Dana M.D.   On: 12/03/2016 16:07   Ct Chest Wo Contrast  Result Date: 12/03/2016 CLINICAL DATA:  Patient for possible left ventricular assist device. Acute on chronic systolic CHF: Ischemic cardiomyopathy but with prominent RV dysfunction on echo and exam. Medtronic ICD. Echo this admission shows EF 25-30% with moderately dilated LV and moderately dilated/moderately dysfunction RV with moderate to severe TR. He was admitted with low output biventricular failure, cardiorenal syndrome, and marked volume overload. He is improved with dual inotrope support. Co-ox 64%, CVP 8-9. He continues to diurese, weight down. Creatinine stable at 1.74. RHC yesterday showed improved filling pressures, good cardiac output on milrinone + norepinephrine. He did not appear to have RV failure out of proportion to LV, CVP/PCWP was 0.56. EXAM: CT CHEST WITHOUT  CONTRAST TECHNIQUE: Multidetector CT imaging of the chest was performed following the standard protocol without IV contrast. COMPARISON:  Chest radiograph, 11/28/2016 FINDINGS: Cardiovascular: Heart is mildly enlarged. There stable changes from prior cardiac surgery. There is a prosthetic aortic valve, well positioned. The wrist three-vessel coronary artery disease. Thoracic aorta normal in caliber. Minor atherosclerotic calcifications are noted along thoracic aorta and at the origin of the aortic arch branch vessels. Right anterior chest wall cardioverter-defibrillator is well positioned. Mediastinum/Nodes: No neck base or axillary masses or adenopathy. No mediastinal or hilar masses or enlarged lymph nodes. Trachea is widely patent. Esophagus is unremarkable. Lungs/Pleura: There are small areas of peripheral reticulonodular type opacities along peribronchovascular distributions in the peripheral right lower lobe. There is more confluent opacity in the posteromedial left lower lobe associated with a minimal pleural effusion and mild volume loss. Lung opacities consistent with atelectasis. There is no convincing pneumonia. No pulmonary edema. No right pleural effusion. No pneumothorax. Upper Abdomen: Small amount ascites collects adjacent to the liver and spleen. There are aortic and branch vessel calcifications. No acute findings. Musculoskeletal: No fracture or acute finding. Moderate-to-severe dextroscoliosis of the thoracic spine, apex at T9. IMPRESSION: 1. No acute abnormalities within the chest. No evidence of congestive heart failure/pulmonary edema. 2. Cardiomegaly. Changes from previous cardiac surgery and aortic valve replacement. Three-vessel coronary artery disease. 3. Left lower lobe atelectasis with the minimal pleural effusion. 4. Small peribronchovascular reticulonodular opacities noted in the right lower lobe, likely chronic. 5. Small amount ascites in the visible upper abdomen. Etiology of this is  unclear. Electronically Signed   By: Lajean Manes M.D.   On: 12/03/2016 16:01     Medications:     Scheduled Medications: . amiodarone  200 mg Oral BID  . apixaban  5 mg Oral BID  . arformoterol  15 mcg Nebulization BID  . atorvastatin  40 mg Oral Daily  . budesonide (PULMICORT) nebulizer solution  0.5 mg Nebulization BID  . digoxin  0.0625 mg Oral Daily  . feeding supplement (GLUCERNA SHAKE)  237 mL Oral BID BM  . fluticasone  1 spray Each Nare Daily  . furosemide  80 mg Intravenous BID  . insulin aspart  0-9 Units Subcutaneous TID WC  .  insulin glargine  10 Units Subcutaneous QHS  . levothyroxine  25 mcg Oral QAC breakfast  . metolazone  2.5 mg Oral Once  . multivitamin with minerals  1 tablet Oral Daily  . umeclidinium bromide  1 puff Inhalation Daily    Infusions: . milrinone 0.25 mcg/kg/min (12/05/16 0600)    PRN Medications: acetaminophen, albuterol, nitroGLYCERIN, ondansetron (ZOFRAN) IV, sodium chloride flush, traMADol, traZODone   Assessment/Plan/Discussion    1. Acute on chronic systolic CHF: Ischemic cardiomyopathy but with prominent RV dysfunction on echo and exam.  Medtronic ICD.  Echo this admission shows EF 25-30% with moderately dilated LV and moderately dilated/moderately dysfunction RV with moderate to severe TR.  He was admitted with low output biventricular failure, cardiorenal syndrome, and marked volume overload. He is improved with dual inotrope support.  He did not appear to have RV failure markedly out of proportion to LV on RHC, CVP/PCWP was 0.56 by RHC. Upon weaning down inotropic support and cutting back to po diuretics, CVP has risen considerably to 17 and co-ox is lower (44% => 55% with increase in milrinone to 0.25).  Creatinine back to 2.2.  - Restart Lasix 80 mg IV bid + 1 dose of metolazone 2.5 x 1.   - Increase milrinone to 0.375 mcg/kg/min and recheck coox at noon.  - Continue digoxin 0.0625 daily. Will check level after has been on for ~  week.  - Hold Entresto, spironolactone, and hydralazine/Imdur with soft BP. - No BB with low output.  - LVAD work up ongoing. Has seen Surgeon, HFSW, and VAD coordinator.  He appears to be failing attempt to wean down on support, if we can re-optimize his hemodynamics it may be time to press forward with LVAD placement.  2. CAD: s/p CABG.  No signs/sx of ischemia. Continue statin.  Does not need ASA with stable CAD on apixaban. 3. AKI on CKD stage III: Suspect cardiorenal syndrome in setting of low output failure/RV failure.  Renal function stabilized with inotropes, but creatinine now back up with attempt to wean inotropes.  CVP up, may be partially due to increased renal venous pressure.  - Continue to follow closely, will diurese more aggressively today as above.  4. Atrial fibrillation: Chronic.  Rate is not elevated. - Currently stable on eliquis.   5. Aortic stenosis s/p TAVR: Stable on echo.  6. NSVT: 1 run yesterday, short.  On amiodarone while we have him on inotropes. Keep K>4.0 Mg > 2.0.  7. Anemia: No overt bleeding.  Has had IV Fe this admission. Hgb stable.   35 minutes critical care time.   Loralie Champagne, MD  12/05/2016, 7:47 AM  Advanced Heart Failure Team Pager [phone] (M-F; 7a - 4p)  Please contact [facility] for night-coverage after hours (4p -7a ) and weekends on [email or web address]

## 2016-12-05 NOTE — Progress Notes (Signed)
[place] for apixaban>>heparin Indication: atrial fibrillation  No Known Allergies  Patient Measurements: Height: 5\' 5"  (165.1 cm) Weight: 196 lb 12.8 oz (89.3 kg) IBW/kg (Calculated) : 61.5 Heparin Dosing Weight: 88kg  Vital Signs: Temp: 98.1 F (36.7 C) (02/01 0858) Temp Source: Oral (02/01 0858) BP: 118/75 (02/01 0858) Pulse Rate: 83 (02/01 0858)  Labs:  Recent Labs  12/03/16 0500 12/04/16 0642 12/05/16 0400  HGB 8.6* 8.0* 8.5*  HCT 26.2* 24.3* 26.1*  PLT 96* 85* 98*  CREATININE 1.79* 1.99* 2.20*    Estimated Creatinine Clearance: 31.6 mL/min (by C-G formula based on SCr of 2.2 mg/dL (H)).   Medical History: Past Medical History:  Diagnosis Date  . Asthma   . CHF (congestive heart failure) ([place])   . Diabetes ([place])   . Kidney disease   . Sleep apnea    Assessment: 71 y.o. male with medical history significant of CHF, CKD, DM2, asthma. Patient here for heart failure and volume overload. He is on apixaban prior to admission and this has been continued while hospitalized, his last dose was last night 1/31. Patient now undergoing LVAD workup and decision was made to transition to heparin prior to surgery, will start this afternoon.   Heparin levels will not be accurate d/t recent direct Xa inhibitor use so will monitor and adjust based on aptt's.   Goal of Therapy:  aPTT 66-102 seconds Monitor platelets by anticoagulation protocol: Yes   Plan:  Start heparin at 1200 units/hr Baseline heparin level and aptt then check aptt tonight  Erin Hearing PharmD., BCPS Clinical Pharmacist Pager [phone] 12/05/2016 11:33 AM

## 2016-12-06 DIAGNOSIS — I255 Ischemic cardiomyopathy: Secondary | ICD-10-CM

## 2016-12-06 DIAGNOSIS — N184 Chronic kidney disease, stage 4 (severe): Secondary | ICD-10-CM

## 2016-12-06 LAB — BASIC METABOLIC PANEL
ANION GAP: 13 (ref 5–15)
Anion gap: 11 (ref 5–15)
BUN: 37 mg/dL — AB (ref 6–20)
BUN: 42 mg/dL — ABNORMAL HIGH (ref 6–20)
CALCIUM: 7.4 mg/dL — AB (ref 8.9–10.3)
CO2: 25 mmol/L (ref 22–32)
CO2: 30 mmol/L (ref 22–32)
CREATININE: 1.79 mg/dL — AB (ref 0.61–1.24)
Calcium: 9.2 mg/dL (ref 8.9–10.3)
Chloride: 86 mmol/L — ABNORMAL LOW (ref 101–111)
Chloride: 85 mmol/L — ABNORMAL LOW (ref 101–111)
Creatinine, Ser: 2.16 mg/dL — ABNORMAL HIGH (ref 0.61–1.24)
GFR calc Af Amer: 42 mL/min — ABNORMAL LOW (ref >60)
GFR calc Af Amer: 34 mL/min — ABNORMAL LOW (ref >60)
GFR, EST NON AFRICAN AMERICAN: 36 mL/min — AB (ref >60)
GFR, EST NON AFRICAN AMERICAN: 29 mL/min — AB (ref >60)
Glucose, Bld: 136 mg/dL — ABNORMAL HIGH (ref 65–99)
Glucose, Bld: 279 mg/dL — ABNORMAL HIGH (ref 65–99)
POTASSIUM: 3.8 mmol/L (ref 3.5–5.1)
Potassium: 2.8 mmol/L — ABNORMAL LOW (ref 3.5–5.1)
SODIUM: 122 mmol/L — AB (ref 135–145)
SODIUM: 128 mmol/L — AB (ref 135–145)

## 2016-12-06 LAB — CBC
HCT: 24.2 % — ABNORMAL LOW (ref 39.0–52.0)
Hemoglobin: 7.9 g/dL — ABNORMAL LOW (ref 13.0–17.0)
MCH: 31.1 pg (ref 26.0–34.0)
MCHC: 32.6 g/dL (ref 30.0–36.0)
MCV: 95.3 fL (ref 78.0–100.0)
PLATELETS: 99 10*3/uL — AB (ref 150–400)
RBC: 2.54 MIL/uL — ABNORMAL LOW (ref 4.22–5.81)
RDW: 18 % — AB (ref 11.5–15.5)
WBC: 6.6 10*3/uL (ref 4.0–10.5)

## 2016-12-06 LAB — APTT
aPTT: 72 seconds — ABNORMAL HIGH (ref 24–36)
aPTT: 71 seconds — ABNORMAL HIGH (ref 24–36)

## 2016-12-06 LAB — GLUCOSE, CAPILLARY
GLUCOSE-CAPILLARY: 141 mg/dL — AB (ref 65–99)
GLUCOSE-CAPILLARY: 266 mg/dL — AB (ref 65–99)
GLUCOSE-CAPILLARY: 263 mg/dL — AB (ref 65–99)
GLUCOSE-CAPILLARY: 286 mg/dL — AB (ref 65–99)

## 2016-12-06 LAB — COOXEMETRY PANEL
Carboxyhemoglobin: 2.4 % — ABNORMAL HIGH (ref 0.5–1.5)
Methemoglobin: 0.9 % (ref 0.0–1.5)
O2 SAT: 65.5 %
Total hemoglobin: 8.1 g/dL — ABNORMAL LOW (ref 12.0–16.0)

## 2016-12-06 LAB — SURGICAL PCR SCREEN
MRSA, PCR: NEGATIVE
Staphylococcus aureus: NEGATIVE

## 2016-12-06 LAB — PROTIME-INR
INR: 1.55
Prothrombin Time: 18.8 seconds — ABNORMAL HIGH (ref 11.4–15.2)

## 2016-12-06 LAB — HEPARIN LEVEL (UNFRACTIONATED)

## 2016-12-06 MED ORDER — BUSPIRONE HCL 5 MG PO TABS
5.0000 mg | ORAL_TABLET | Freq: Two times a day (BID) | ORAL | Status: DC
  Administered 2016-12-06 – 2017-01-01 (×49): 5 mg via ORAL
  Filled 2016-12-06 (×55): qty 1

## 2016-12-06 MED ORDER — POTASSIUM CHLORIDE CRYS ER 20 MEQ PO TBCR
40.0000 meq | EXTENDED_RELEASE_TABLET | Freq: Once | ORAL | Status: AC
  Administered 2016-12-06: 40 meq via ORAL
  Filled 2016-12-06: qty 2

## 2016-12-06 MED ORDER — TOLVAPTAN 15 MG PO TABS
15.0000 mg | ORAL_TABLET | ORAL | Status: DC
  Administered 2016-12-06: 15 mg via ORAL
  Filled 2016-12-06 (×2): qty 1

## 2016-12-06 MED ORDER — POTASSIUM CHLORIDE CRYS ER 20 MEQ PO TBCR
40.0000 meq | EXTENDED_RELEASE_TABLET | Freq: Two times a day (BID) | ORAL | Status: DC
  Administered 2016-12-06 – 2016-12-12 (×13): 40 meq via ORAL
  Filled 2016-12-06 (×14): qty 2

## 2016-12-06 NOTE — Progress Notes (Signed)
Inpatient Diabetes Program Recommendations  AACE/ADA: New Consensus Statement on Inpatient Glycemic Control (2015)  Target Ranges:  Prepandial:   less than 140 mg/dL      Peak postprandial:   less than 180 mg/dL (1-2 hours)      Critically ill patients:  140 - 180 mg/dL   Results for Trevor [name], Trevor [name] (MRN [id number]) as of 12/06/2016 13:02  Ref. Range 12/05/2016 08:56 12/05/2016 12:25 12/05/2016 17:01 12/05/2016 21:52  Glucose-Capillary Latest Ref Range: 65 - 99 mg/dL 154 (H) 234 (H) 214 (H) 224 (H)   Results for Trevor [name], Trevor [name] (MRN [id number]) as of 12/06/2016 13:02  Ref. Range 12/06/2016 07:53 12/06/2016 11:16  Glucose-Capillary Latest Ref Range: 65 - 99 mg/dL 141 (H) 266 (H)    Admit with: CHF  History: DM  Home DM Meds: Lantus 20 units QHS       Humalog 50 units TID  Current Insulin Orders: Lantus 10 units QHS      Novolog Sensitive Correction Scale/ SSI (0-9 units) TID AC          MD- Please consider starting Novolog Meal Coverage:  Novolog 4 units TID with meals (hold if pt eats <50% of meal)     --Will follow patient during hospitalization--  Wyn Quaker RN, MSN, CDE Diabetes Coordinator Inpatient Glycemic Control Team Team Pager: [phone] (8a-5p)

## 2016-12-06 NOTE — Progress Notes (Signed)
Daily Progress Note   Patient Name: Trevor [name]       Date: 12/06/2016 DOB: [birth year]  Age: 71 y.o. MRN#: [id number] Attending Physician: Larey Dresser, MD Primary Care Physician: No PCP Per Patient Admit Date: 11/27/2016  Reason for Consultation/Follow-up: VAD eval  Subjective: Sitting up in chair. Complains of anxiety and insomnia.   Length of Stay: 8  Current Medications: Scheduled Meds:  . amiodarone  200 mg Oral BID  . arformoterol  15 mcg Nebulization BID  . atorvastatin  40 mg Oral Daily  . budesonide (PULMICORT) nebulizer solution  0.5 mg Nebulization BID  . busPIRone  5 mg Oral BID  . digoxin  0.0625 mg Oral Daily  . feeding supplement (GLUCERNA SHAKE)  237 mL Oral BID BM  . fluticasone  1 spray Each Nare Daily  . furosemide  80 mg Intravenous BID  . insulin aspart  0-9 Units Subcutaneous TID WC  . insulin glargine  10 Units Subcutaneous QHS  . levothyroxine  25 mcg Oral QAC breakfast  . multivitamin with minerals  1 tablet Oral Daily  . potassium chloride  40 mEq Oral BID  . tolvaptan  15 mg Oral Q24H  . umeclidinium bromide  1 puff Inhalation Daily    Continuous Infusions: . heparin 950 Units/hr (12/06/16 0950)  . milrinone 0.375 mcg/kg/min (12/06/16 0600)    PRN Meds: acetaminophen, albuterol, nitroGLYCERIN, ondansetron (ZOFRAN) IV, sodium chloride flush, traMADol, traZODone  Physical Exam  Constitutional: He is oriented to person, place, and time. He appears well-developed.  HENT:  Head: Normocephalic and atraumatic.  Cardiovascular: Normal rate.  An irregularly irregular rhythm present.  Pulmonary/Chest: Effort normal and breath sounds normal. No accessory muscle usage. No tachypnea. No respiratory distress.  Abdominal: Soft. Normal appearance.    Neurological: He is alert and oriented to person, place, and time.  Nursing note and vitals reviewed.           Vital Signs: BP 116/71 (BP Location: Left Arm)   Pulse 84   Temp 97.2 F (36.2 C) (Oral)   Resp 17   Ht 5' 5"  (1.651 m)   Wt 89.5 kg (197 lb 4.8 oz)   SpO2 98%   BMI 32.83 kg/m  SpO2: SpO2: 98 % O2 Device: O2 Device: Not Delivered O2 Flow Rate: O2 Flow Rate (  L/min): 0.5 L/min  Intake/output summary:  Intake/Output Summary (Last 24 hours) at 12/06/16 1343 Last data filed at 12/06/16 1259  Gross per 24 hour  Intake          1193.14 ml  Output             2575 ml  Net         -1381.86 ml   LBM: Last BM Date: 12/05/16 Baseline Weight: Weight: 93.7 kg (206 lb 9.1 oz) Most recent weight: Weight: 89.5 kg (197 lb 4.8 oz)       Palliative Assessment/Data:    Flowsheet Rows   Flowsheet Row Most Recent Value  Intake Tab  Referral Department  Cardiology  Unit at Time of Referral  ICU  Palliative Care Primary Diagnosis  Cardiac  Date Notified  12/03/16  Palliative Care Type  New Palliative care  Reason for referral  Clarify Goals of Care, Other (Comment) [VAD Eval]  Date of Admission  11/27/16  # of days IP prior to Palliative referral  6  Clinical Assessment  Psychosocial & Spiritual Assessment  Palliative Care Outcomes      Patient Active Problem List   Diagnosis Date Noted  . Acute on chronic systolic CHF (congestive heart failure) ([place]) 11/28/2016  . Acute on chronic kidney failure ([place]) 11/28/2016  . Cardiorenal syndrome with renal failure 11/28/2016  . Acute on chronic systolic CHF (congestive heart failure), NYHA class 4 ([place]) 11/28/2016  . Cardiomyopathy, ischemic 11/27/2016  . Coronary artery disease 11/27/2016  . Hx of CABG 11/27/2016  . Status post aortic valve replacement 11/27/2016  . Asthma, chronic 11/27/2016  . OSA (obstructive sleep apnea) 11/27/2016  . Chronic renal insufficiency 11/27/2016    Palliative Care Assessment & Plan    HPI: 71 y.o. male  with past medical history of CAD s/p CABG 2010, AVR TAVR 9629, systolic CHF (EF 52-84%), CKD, DM2, asthma, and recent admission in [place] for "cardiorenal syndrome" but renal function recovered much more than his medical team anticipated per patient report . He was admitted on 11/27/2016 with chest pain, SOB, and worsening leg swelling. EF 20-25% now and unable to wean milrinone d/t worsening renal function. Now considering VAD.    Assessment: I met again today with Mr. Vandevoort. No family at bedside. He tells me that he is a little disappointed as he learned that he will not be able to leave the hospital prior to VAD procedure. He has decided to proceed with VAD. We discussed ways we can assist to make him more comfortable while he is here. He tells me that he cannot sleep mainly because the bed is uncomfortable (will work on replacement bed) and he has trazodone ordered. He also tells me that he is feeling anxious with the upcoming surgery and would like to resume Buspar (he looked through his paperwork from [place] as he was recently started on this for dosage).   We discussed techniques for distraction and coping during hospitalization and VAD recovery. Offered therapeutic listening and emotional support.   Recommendations/Plan:  Insomnia: Continue trazodone and will obtain different bed for his comfort.   Anxiety: Restart Buspar 5 mg BID. Will discuss with heart failure team.   Goals of Care and Additional Recommendations:  Limitations on Scope of Treatment: Full Scope Treatment  Code Status:  Full code  Prognosis:   Unable to determine  Discharge Planning:  To Be Determined   Thank you for allowing the Palliative Medicine Team to assist in the care  of this patient.   Total Time 9mn Prolonged Time Billed  no       Greater than 50%  of this time was spent counseling and coordinating care related to the above assessment and plan.  AVinie Sill  NP Palliative Medicine Team Pager # [phone](M-F 8a-5p) Team Phone # [phone](Nights/Weekends)

## 2016-12-06 NOTE — Progress Notes (Addendum)
4 Days Post-Op Procedure(s) (LRB): Right Heart Cath (N/A) Subjective: Patient's hemodynamics and renal function improved with increased milrinone to 0.375 He still has considerable lower extremity edema and needs continued diuresis. Will add TED hose Patient's PFTs are considerably lower than expected with no previous smoking history-his scoliosis probably has a detrimental effect on his mechanics of breathing. He states that he tolerated sternotomy for his CABG 8 years ago and was extubated shortly after surgery. [name] eliquis was stopped one day ago.   Operative note for his CABG not yetreceived from [facility] at [place] to hopefully set date for surgery end of next week  Objective: Vital signs in last 24 hours: Temp:  [97.2 F (36.2 C)-98.5 F (36.9 C)] 97.2 F (36.2 C) (02/02 1100) Pulse Rate:  [72-84] 84 (02/02 1100) Cardiac Rhythm: Atrial fibrillation (02/02 0800) Resp:  [14-21] 17 (02/02 1100) BP: (95-116)/(44-71) 116/71 (02/02 1100) SpO2:  [96 %-98 %] 98 % (02/02 1100) Weight:  [197 lb 4.8 oz (89.5 kg)] 197 lb 4.8 oz (89.5 kg) (02/02 0300)  Hemodynamic parameters for last 24 hours: CVP:  [8 mmHg-13 mmHg] 9 mmHg  Intake/Output from previous day: 02/01 0701 - 02/02 0700 In: 1143.1 [P.O.:770; I.V.:373.1] Out: 2000 [Urine:2000] Intake/Output this shift: Total I/O In: 320 [P.O.:320] Out: 875 [Urine:875]      Exam    General- alert and comfortable   Lungs- clear without rales, wheezes   Cor- regular rate and rhythm, 2/6 holosystolic murmur , no  gallop   Abdomen- soft, non-tender   Extremities - warm, non-tender, significant persistent edema   Neuro- oriented, appropriate, no focal weakness   Lab Results:  Recent Labs  12/05/16 0400 12/06/16 0525  WBC 6.9 6.6  HGB 8.5* 7.9*  HCT 26.1* 24.2*  PLT 98* 99*   BMET:  Recent Labs  12/06/16 0525 12/06/16 1140  NA 122* 128*  K 2.8* 3.8  CL 86* 85*  CO2 25 30  GLUCOSE 136* 279*   BUN 37* 42*  CREATININE 1.79* 2.16*  CALCIUM 7.4* 9.2    PT/INR:  Recent Labs  12/06/16 0525  LABPROT 18.8*  INR 1.55   ABG    Component Value Date/Time   PHART 7.501 (H) 12/02/2016 1055   HCO3 33.1 (H) 12/02/2016 1055   TCO2 34 12/02/2016 1055   O2SAT 65.5 12/06/2016 0758   CBG (last 3)   Recent Labs  12/05/16 2152 12/06/16 0753 12/06/16 1116  GLUCAP 224* 141* 266*    Assessment/Plan: S/P Procedure(s) (LRB): Right Heart Cath (N/A) Patient needs more diuresis, lower CVP prior to surgery Patient needs Eliquis washout   LOS: 8 days    Tharon Aquas Trigt III 12/06/2016

## 2016-12-06 NOTE — Progress Notes (Signed)
CARDIAC REHAB PHASE I   Third attempt to ambulate with pt today. Pt in bed, states he just returned from walking a half a lap around the unit, states he would like to rest now. Will follow up tomorrow.  Lenna Sciara, RN, BSN 12/06/2016 2:22 PM

## 2016-12-06 NOTE — Progress Notes (Signed)
ANTICOAGULATION CONSULT NOTE - Follow Up Consult  Pharmacy Consult for heparin Indication: atrial fibrillation  Labs:  Recent Labs  12/04/16 0642 12/05/16 0400 12/05/16 1108 12/05/16 2013 12/06/16 0525  HGB 8.0* 8.5*  --   --  7.9*  HCT 24.3* 26.1*  --   --  24.2*  PLT 85* 98*  --   --  99*  APTT  --   --  36 154* 72*  LABPROT  --   --   --   --  18.8*  INR  --   --   --   --  1.55  HEPARINUNFRC  --   --  >2.20*  --   --   CREATININE 1.99* 2.20*  --   --  1.79*    Assessment/Plan:  71yo male therapeutic on heparin after rate change. Will continue gtt at current rate and confirm stable with additional PTT.   Wynona Neat, PharmD, BCPS  12/06/2016,6:00 AM

## 2016-12-06 NOTE — Progress Notes (Signed)
[place] for apixaban>>heparin Indication: atrial fibrillation  No Known Allergies  Patient Measurements: Height: 5\' 5"  (165.1 cm) Weight: 197 lb 4.8 oz (89.5 kg) IBW/kg (Calculated) : 61.5 Heparin Dosing Weight: 88kg  Vital Signs: Temp: 97.3 F (36.3 C) (02/02 0750) Temp Source: Oral (02/02 0750) BP: 111/55 (02/02 0750) Pulse Rate: 72 (02/02 0750)  Labs:  Recent Labs  12/04/16 0642 12/05/16 0400 12/05/16 1108 12/05/16 2013 12/06/16 0525 12/06/16 0526  HGB 8.0* 8.5*  --   --  7.9*  --   HCT 24.3* 26.1*  --   --  24.2*  --   PLT 85* 98*  --   --  99*  --   APTT  --   --  36 154* 72*  --   LABPROT  --   --   --   --  18.8*  --   INR  --   --   --   --  1.55  --   HEPARINUNFRC  --   --  >2.20*  --   --  >2.20*  CREATININE 1.99* 2.20*  --   --  1.79*  --     Estimated Creatinine Clearance: 38.9 mL/min (by C-G formula based on SCr of 1.79 mg/dL (H)).   Medical History: Past Medical History:  Diagnosis Date  . Asthma   . CHF (congestive heart failure) ([place])   . Diabetes ([place])   . Kidney disease   . Sleep apnea    Assessment: 71 y.o. male with medical history significant of CHF, CKD, DM2, asthma. Patient here for heart failure and volume overload. He is on apixaban prior to admission and this has been continued while hospitalized, his last dose was last night 1/31. Patient now undergoing LVAD workup and decision was made to transition to heparin prior to surgery, will start this afternoon 2/1.   Heparin levels will not be accurate d/t recent direct Xa inhibitor use so will monitor and adjust based on aptt's.   Aptt's have been at goal 70s on recent checks, heparin levels still high. No bleeding issues noted, will continue heparin at current rate.   Goal of Therapy:  aPTT 66-102 seconds Monitor platelets by anticoagulation protocol: Yes   Plan:  No change to heparin rate Daily Heparin level, CBC, aptt for now  Erin Hearing PharmD., BCPS Clinical Pharmacist Pager [phone] 12/06/2016 10:30 AM

## 2016-12-06 NOTE — Progress Notes (Signed)
Reviewed VAD education packet specific to the operation and recovery period. Patient participated in the education process and verbalized understanding. Will arrange a formal pre-education session to review the Heart mate II device in detail with his family/caregivers this coming week.   Balinda Quails RN, VAD Coordinator 24/7 pager [phone]

## 2016-12-06 NOTE — Progress Notes (Signed)
Patient ID: Trevor [name], male   DOB: 04-21-1946, 71 y.o.   MRN: [id number]    Advanced Heart Failure Rounding Note   Subjective:    Admitted with marked volume overload and low output HF.  BP soft so he was started on milrinone 0.25 mcg + norepi 3 mcg.   Coox 44.4% 1/31 OFF norepi and on milrinone 0.125 mcg/kg/min. Milrinone increased to 0.375, co-ox 71% yesterday afternoon.   Creatinine 1.74 -> 1.99 -> 2.2 -> 1.79.  CVP 12.   No complaints, doing some walking.  Na down to 122 today.   Echo: EF 25-30%, moderate LV dilation, bioprosthetic aortic valve ok, moderate MR, RV moderately dilated with moderate dysfunction, moderate to severe TR, PASP 51 mmHg.   Renal u/s with cortical thinning R 12.0 cm L 12.2 cm  RHC Procedural Findings (on milrinone 0.25 + norepinephrine 2): Hemodynamics (mmHg) RA mean 9 RV 52/11 PA 52/16, mean 31 PCWP mean 16 CVP/PCWP = 0.56 Oxygen saturations: PA 63% AO 95% Cardiac Output (Fick) 6.93  Cardiac Index (Fick) 3.55 PVR 2.1 WU Cardiac Output (Thermo) 6.16 Cardiac Index (Thermo) 3.16  PVR 2.4 WU  Objective:   Weight Range:  Vital Signs:   Temp:  [97.8 F (36.6 C)-98.5 F (36.9 C)] 98.2 F (36.8 C) (02/02 0300) Pulse Rate:  [79-84] 79 (02/01 1702) Resp:  [14-21] 14 (02/02 0300) BP: (95-118)/(44-75) 95/44 (02/02 0300) SpO2:  [96 %-97 %] 97 % (02/01 2014) Weight:  [197 lb 4.8 oz (89.5 kg)] 197 lb 4.8 oz (89.5 kg) (02/02 0300) Last BM Date: 12/05/16  Weight change: Filed Weights   12/04/16 0300 12/05/16 0500 12/06/16 0300  Weight: 196 lb 1.6 oz (89 kg) 196 lb 12.8 oz (89.3 kg) 197 lb 4.8 oz (89.5 kg)    Intake/Output:   Intake/Output Summary (Last 24 hours) at 12/06/16 0748 Last data filed at 12/06/16 0600  Gross per 24 hour  Intake          1143.14 ml  Output             2000 ml  Net          -856.86 ml     Physical Exam: CVP 12 General:  Elderly appearing. resp difficulty. Sitting in the chair  HEENT: normal.  Neck:  supple. JVP 12. Carotids 2+ bilat; no bruits. No thyromegaly or nodule noted.  Cor: PMI nondisplaced. Irregular rate & rhythm. 2/6 HSM LLSB, 2/6 SEM RUSB.  Lungs: Diminished basilar sounds, no crackles.  Abdomen: obese soft, NT, ND, no HSM. No bruits or masses. +BS  Extremities: no cyanosis, clubbing, rash.  1+ edema 3/4 to knees bilaterally.    Neuro: alert & orientedx3, cranial nerves grossly intact. moves all 4 extremities w/o difficulty. Affect pleasant  Telemetry: Reviewed,  A fib 70s-90s  Labs: Basic Metabolic Panel:  Recent Labs Lab 11/29/16 1300  12/02/16 0445 12/03/16 0500 12/04/16 0642 12/04/16 0946 12/05/16 0400 12/06/16 0525  NA 136  < > 134* 134* 130*  --  130* 122*  K 4.2  < > 3.0* 3.4* 3.3*  --  4.3 2.8*  CL 99*  < > 91* 89* 89*  --  89* 86*  CO2 28  < > 32 32 32  --  29 25  GLUCOSE 229*  < > 165* 146* 154*  --  176* 136*  BUN 74*  < > 50* 46* 44*  --  44* 37*  CREATININE 1.77*  < > 1.77* 1.79* 1.99*  --  2.20* 1.79*  CALCIUM 8.9  < > 8.8* 9.0 8.5*  --  9.2 7.4*  MG 1.9  --   --  1.9  --  1.8  --   --   < > = values in this interval not displayed.  Liver Function Tests:  Recent Labs Lab 12/04/16 0642  AST 63*  ALT 54  ALKPHOS 113  BILITOT 1.1  PROT 5.9*  ALBUMIN 3.1*   No results for input(s): LIPASE, AMYLASE in the last 168 hours. No results for input(s): AMMONIA in the last 168 hours.  CBC:  Recent Labs Lab 11/30/16 0430 12/03/16 0500 12/04/16 0642 12/05/16 0400 12/06/16 0525  WBC 6.6 6.6 5.8 6.9 6.6  NEUTROABS  --   --   --  4.6  --   HGB 8.6* 8.6* 8.0* 8.5* 7.9*  HCT 25.9* 26.2* 24.3* 26.1* 24.2*  MCV 95.6 95.6 96.0 95.6 95.3  PLT 100* 96* 85* 98* 99*    Cardiac Enzymes: No results for input(s): CKTOTAL, CKMB, CKMBINDEX, TROPONINI in the last 168 hours.  BNP: BNP (last 3 results)  Recent Labs  11/28/16 1104  BNP 1,825.3*    ProBNP (last 3 results)  Recent Labs  11/27/16 1207  PROBNP 1,746.0*      Other  results:  Imaging: No results found.   Medications:     Scheduled Medications: . amiodarone  200 mg Oral BID  . arformoterol  15 mcg Nebulization BID  . atorvastatin  40 mg Oral Daily  . budesonide (PULMICORT) nebulizer solution  0.5 mg Nebulization BID  . digoxin  0.0625 mg Oral Daily  . feeding supplement (GLUCERNA SHAKE)  237 mL Oral BID BM  . fluticasone  1 spray Each Nare Daily  . furosemide  80 mg Intravenous BID  . insulin aspart  0-9 Units Subcutaneous TID WC  . insulin glargine  10 Units Subcutaneous QHS  . levothyroxine  25 mcg Oral QAC breakfast  . multivitamin with minerals  1 tablet Oral Daily  . potassium chloride  40 mEq Oral BID  . potassium chloride  40 mEq Oral Once  . potassium chloride  40 mEq Oral Once  . tolvaptan  15 mg Oral Q24H  . umeclidinium bromide  1 puff Inhalation Daily    Infusions: . heparin 950 Units/hr (12/06/16 0600)  . milrinone 0.375 mcg/kg/min (12/06/16 0600)    PRN Medications: acetaminophen, albuterol, nitroGLYCERIN, ondansetron (ZOFRAN) IV, sodium chloride flush, traMADol, traZODone   Assessment/Plan/Discussion    1. Acute on chronic systolic CHF: Ischemic cardiomyopathy but with prominent RV dysfunction on echo and exam.  Medtronic ICD.  Echo this admission shows EF 25-30% with moderately dilated LV and moderately dilated/moderately dysfunction RV with moderate to severe TR.  He was admitted with low output biventricular failure, cardiorenal syndrome, and marked volume overload. He is improved with dual inotrope support.  He did not appear to have RV failure markedly out of proportion to LV on RHC, CVP/PCWP was 0.56 by RHC. Upon weaning down inotropic support and cutting back to po diuretics, CVP rose and co-ox is dropped to 44% => 55% with increase in milrinone to 0.25 => 71% with increase in milrinone to 0.375.  Creatinine down to 1.79. CVP 12 now today.  Some diuresis but not marked yesterday.  - Continue IV Lasix today.  Hold off  on metolazone with drop in sodium, will give tolvaptan 15 mg daily.   - Continue milrinone 0.375, check co-ox this morning.   - Continue digoxin 0.0625  daily. Check level in am.   - Hold Entresto, spironolactone, and hydralazine/Imdur with soft BP. - No BB with low output.  - LVAD work up ongoing. Has seen surgeon, HFSW, and VAD coordinator.  He failed attempt to wean down on support, if we can re-optimize his hemodynamics it may be time to press forward with LVAD placement. Will discuss with Dr Prescott Gum.  2. CAD: s/p CABG.  No signs/sx of ischemia. Continue statin.  Does not need ASA with stable CAD on anticoagulation.  3. AKI on CKD stage III: Suspect cardiorenal syndrome in setting of low output failure/RV failure.  Renal function stabilized with inotropes, but creatinine increased with attempt to wean inotropes.  Now back down again on higher milrinone. 4. Atrial fibrillation: Chronic.  Rate is not elevated. - Currently stable on heparin gtt/off Eliquis.  5. Aortic stenosis s/p TAVR: Stable on echo.  6. NSVT: 1 run yesterday, short.  On amiodarone while we have him on inotropes. Keep K>4.0 Mg > 2.0 (needs aggressive K replacement today).   7. Anemia: No overt bleeding.  Has had IV Fe this admission. Hgb lower today. Follow closely.  8. Hyponatremia: Hypervolemic hyponatremia.  Will give dose of tolvaptan and restrict fluid.    35 minutes critical care time.   Loralie Champagne, MD  12/06/2016, 7:48 AM  Advanced Heart Failure Team Pager [phone] (M-F; 7a - 4p)  Please contact [facility] for night-coverage after hours (4p -7a ) and weekends on [email or web address]

## 2016-12-07 ENCOUNTER — Inpatient Hospital Stay (HOSPITAL_COMMUNITY): Payer: Medicare Other

## 2016-12-07 DIAGNOSIS — N179 Acute kidney failure, unspecified: Secondary | ICD-10-CM

## 2016-12-07 LAB — PROTIME-INR
INR: 1.39
Prothrombin Time: 17.2 seconds — ABNORMAL HIGH (ref 11.4–15.2)

## 2016-12-07 LAB — CBC
HCT: 23.6 % — ABNORMAL LOW (ref 39.0–52.0)
Hemoglobin: 7.8 g/dL — ABNORMAL LOW (ref 13.0–17.0)
MCH: 31.6 pg (ref 26.0–34.0)
MCHC: 33.1 g/dL (ref 30.0–36.0)
MCV: 95.5 fL (ref 78.0–100.0)
PLATELETS: 108 10*3/uL — AB (ref 150–400)
RBC: 2.47 MIL/uL — AB (ref 4.22–5.81)
RDW: 18.1 % — ABNORMAL HIGH (ref 11.5–15.5)
WBC: 6.5 10*3/uL (ref 4.0–10.5)

## 2016-12-07 LAB — BASIC METABOLIC PANEL
Anion gap: 10 (ref 5–15)
BUN: 46 mg/dL — AB (ref 6–20)
CO2: 30 mmol/L (ref 22–32)
CREATININE: 2.26 mg/dL — AB (ref 0.61–1.24)
Calcium: 9 mg/dL (ref 8.9–10.3)
Chloride: 92 mmol/L — ABNORMAL LOW (ref 101–111)
GFR calc Af Amer: 32 mL/min — ABNORMAL LOW (ref >60)
GFR, EST NON AFRICAN AMERICAN: 27 mL/min — AB (ref >60)
Glucose, Bld: 196 mg/dL — ABNORMAL HIGH (ref 65–99)
Potassium: 4 mmol/L (ref 3.5–5.1)
SODIUM: 132 mmol/L — AB (ref 135–145)

## 2016-12-07 LAB — DIGOXIN LEVEL: Digoxin Level: 0.2 ng/mL — ABNORMAL LOW (ref 0.8–2.0)

## 2016-12-07 LAB — COOXEMETRY PANEL
CARBOXYHEMOGLOBIN: 2.1 % — AB (ref 0.5–1.5)
METHEMOGLOBIN: 1 % (ref 0.0–1.5)
O2 SAT: 87.6 %
TOTAL HEMOGLOBIN: 7.9 g/dL — AB (ref 12.0–16.0)

## 2016-12-07 LAB — GLUCOSE, CAPILLARY
Glucose-Capillary: 167 mg/dL — ABNORMAL HIGH (ref 65–99)
Glucose-Capillary: 249 mg/dL — ABNORMAL HIGH (ref 65–99)
Glucose-Capillary: 268 mg/dL — ABNORMAL HIGH (ref 65–99)
Glucose-Capillary: 301 mg/dL — ABNORMAL HIGH (ref 65–99)

## 2016-12-07 LAB — OCCULT BLOOD X 1 CARD TO LAB, STOOL: FECAL OCCULT BLD: NEGATIVE

## 2016-12-07 LAB — PREPARE RBC (CROSSMATCH)

## 2016-12-07 LAB — APTT: aPTT: 68 seconds — ABNORMAL HIGH (ref 24–36)

## 2016-12-07 LAB — HEPARIN LEVEL (UNFRACTIONATED): Heparin Unfractionated: 2.16 IU/mL — ABNORMAL HIGH (ref 0.30–0.70)

## 2016-12-07 LAB — CEA: CEA: 4 ng/mL (ref 0.0–4.7)

## 2016-12-07 MED ORDER — METOLAZONE 2.5 MG PO TABS
2.5000 mg | ORAL_TABLET | Freq: Once | ORAL | Status: AC
  Administered 2016-12-07: 2.5 mg via ORAL
  Filled 2016-12-07: qty 1

## 2016-12-07 NOTE — Progress Notes (Signed)
1025-1030 Pt declined ambulation at this time, sleeping. Will f/u in the afternoon. Sol Passer, MS, ACSM CEP

## 2016-12-07 NOTE — Progress Notes (Signed)
Report obtained.  Taking over care of pt at this time.  Pt sitting up in chair, watching tv.  Pt has no s/s of any acute distress noted.  Denies pain.  T and S collected and sent to lab.

## 2016-12-07 NOTE — Progress Notes (Signed)
1456- Patient states he just returned from walking a lap around the unit. Will f/u on Monday. Sol Passer, MS, ACSM CEP

## 2016-12-07 NOTE — Progress Notes (Signed)
[place] for apixaban>>heparin Indication: atrial fibrillation  No Known Allergies  Patient Measurements: Height: 5\' 5"  (165.1 cm) Weight: 196 lb 11.2 oz (89.2 kg) IBW/kg (Calculated) : 61.5 Heparin Dosing Weight: 88kg  Vital Signs: Temp: 98 F (36.7 C) (02/03 0305) Temp Source: Oral (02/03 0305) BP: 105/65 (02/03 0305) Pulse Rate: 82 (02/03 0706)  Labs:  Recent Labs  12/05/16 0400 12/05/16 1108  12/06/16 0525 12/06/16 0526 12/06/16 1140 12/07/16 0441  HGB 8.5*  --   --  7.9*  --   --  7.8*  HCT 26.1*  --   --  24.2*  --   --  23.6*  PLT 98*  --   --  99*  --   --  108*  APTT  --  36  < > 72*  --  71* 68*  LABPROT  --   --   --  18.8*  --   --  17.2*  INR  --   --   --  1.55  --   --  1.39  HEPARINUNFRC  --  >2.20*  --   --  >2.20*  --  2.16*  CREATININE 2.20*  --   --  1.79*  --  2.16* 2.26*  < > = values in this interval not displayed.  Estimated Creatinine Clearance: 30.8 mL/min (by C-G formula based on SCr of 2.26 mg/dL (H)).   Medical History: Past Medical History:  Diagnosis Date  . Asthma   . CHF (congestive heart failure) ([place])   . Diabetes ([place])   . Kidney disease   . Sleep apnea    . heparin 950 Units/hr (12/07/16 0000)  . milrinone 0.375 mcg/kg/min (12/07/16 0310)     Assessment: 71 y.o. male with medical history significant of CHF, CKD, DM2, asthma. Patient here for heart failure and volume overload. He is on apixaban prior to admission and this has been continued while hospitalized, his last dose was last night 1/31. Patient now undergoing LVAD workup and decision was made to transition to heparin prior to surgery, will start this afternoon 2/1.   Heparin levels remain elevated d/t recent direct Xa inhibitor use, but PTT at goal this AM.  No bleeding or complications noted, Hgb/Pltc low but stable.  Goal of Therapy:  aPTT 66-102 seconds Monitor platelets by anticoagulation protocol: Yes   Plan:  No  change to heparin rate Daily Heparin level, CBC, and aptt for now  Uvaldo Rising, BCPS  Clinical Pharmacist Pager [phone]  12/07/2016 7:55 AM

## 2016-12-07 NOTE — Progress Notes (Signed)
Patient ID: Trevor [name], male   DOB: 12-May-1946, 71 y.o.   MRN: [id number]    Advanced Heart Failure Rounding Note   Subjective:    Admitted with marked volume overload and low output HF.  BP soft so he was started on milrinone 0.25 mcg + norepi 3 mcg.   Coox 44.4% 1/31 OFF norepi and on milrinone 0.125 mcg/kg/min. Milrinone increased to 0.375, co-ox 88%.  CVP remains about 14.  Creatinine 1.74 -> 1.99 -> 2.2 -> 1.79 -> 2.2.    No complaints, doing some walking.  Na 122 => 132.   Echo: EF 25-30%, moderate LV dilation, bioprosthetic aortic valve ok, moderate MR, RV moderately dilated with moderate dysfunction, moderate to severe TR, PASP 51 mmHg.   Renal u/s with cortical thinning R 12.0 cm L 12.2 cm  RHC Procedural Findings (on milrinone 0.25 + norepinephrine 2): Hemodynamics (mmHg) RA mean 9 RV 52/11 PA 52/16, mean 31 PCWP mean 16 CVP/PCWP = 0.56 Oxygen saturations: PA 63% AO 95% Cardiac Output (Fick) 6.93  Cardiac Index (Fick) 3.55 PVR 2.1 WU Cardiac Output (Thermo) 6.16 Cardiac Index (Thermo) 3.16  PVR 2.4 WU  Objective:   Weight Range:  Vital Signs:   Temp:  [97.2 F (36.2 C)-98.6 F (37 C)] 98 F (36.7 C) (02/03 0305) Pulse Rate:  [72-84] 82 (02/03 0706) Resp:  [15-18] 18 (02/03 0706) BP: (105-116)/(55-72) 105/65 (02/03 0305) SpO2:  [94 %-98 %] 94 % (02/03 0706) Weight:  [196 lb 11.2 oz (89.2 kg)] 196 lb 11.2 oz (89.2 kg) (02/03 0305) Last BM Date: 12/05/16  Weight change: Filed Weights   12/05/16 0500 12/06/16 0300 12/07/16 0305  Weight: 196 lb 12.8 oz (89.3 kg) 197 lb 4.8 oz (89.5 kg) 196 lb 11.2 oz (89.2 kg)    Intake/Output:   Intake/Output Summary (Last 24 hours) at 12/07/16 0739 Last data filed at 12/07/16 0300  Gross per 24 hour  Intake              680 ml  Output             2725 ml  Net            -2045 ml     Physical Exam: CVP 14 General:  Elderly appearing. resp difficulty. Sitting in the chair  HEENT: normal.  Neck:  supple. JVP 12. Carotids 2+ bilat; no bruits. No thyromegaly or nodule noted.  Cor: PMI nondisplaced. Irregular rate & rhythm. 2/6 HSM LLSB, 2/6 SEM RUSB.  Lungs: Diminished basilar sounds, no crackles.  Abdomen: obese soft, NT, ND, no HSM. No bruits or masses. +BS  Extremities: no cyanosis, clubbing, rash.  1+ edema 3/4 to knees bilaterally.    Neuro: alert & orientedx3, cranial nerves grossly intact. moves all 4 extremities w/o difficulty. Affect pleasant  Telemetry: Reviewed,  A fib 70s-90s  Labs: Basic Metabolic Panel:  Recent Labs Lab 12/03/16 0500 12/04/16 0642 12/04/16 0946 12/05/16 0400 12/06/16 0525 12/06/16 1140 12/07/16 0441  NA 134* 130*  --  130* 122* 128* 132*  K 3.4* 3.3*  --  4.3 2.8* 3.8 4.0  CL 89* 89*  --  89* 86* 85* 92*  CO2 32 32  --  29 25 30 30   GLUCOSE 146* 154*  --  176* 136* 279* 196*  BUN 46* 44*  --  44* 37* 42* 46*  CREATININE 1.79* 1.99*  --  2.20* 1.79* 2.16* 2.26*  CALCIUM 9.0 8.5*  --  9.2 7.4* 9.2 9.0  MG  1.9  --  1.8  --   --   --   --     Liver Function Tests:  Recent Labs Lab 12/04/16 0642  AST 63*  ALT 54  ALKPHOS 113  BILITOT 1.1  PROT 5.9*  ALBUMIN 3.1*   No results for input(s): LIPASE, AMYLASE in the last 168 hours. No results for input(s): AMMONIA in the last 168 hours.  CBC:  Recent Labs Lab 12/03/16 0500 12/04/16 0642 12/05/16 0400 12/06/16 0525 12/07/16 0441  WBC 6.6 5.8 6.9 6.6 6.5  NEUTROABS  --   --  4.6  --   --   HGB 8.6* 8.0* 8.5* 7.9* 7.8*  HCT 26.2* 24.3* 26.1* 24.2* 23.6*  MCV 95.6 96.0 95.6 95.3 95.5  PLT 96* 85* 98* 99* 108*    Cardiac Enzymes: No results for input(s): CKTOTAL, CKMB, CKMBINDEX, TROPONINI in the last 168 hours.  BNP: BNP (last 3 results)  Recent Labs  11/28/16 1104  BNP 1,825.3*    ProBNP (last 3 results)  Recent Labs  11/27/16 1207  PROBNP 1,746.0*      Other results:  Imaging: No results found.   Medications:     Scheduled Medications: .  amiodarone  200 mg Oral BID  . arformoterol  15 mcg Nebulization BID  . atorvastatin  40 mg Oral Daily  . budesonide (PULMICORT) nebulizer solution  0.5 mg Nebulization BID  . busPIRone  5 mg Oral BID  . digoxin  0.0625 mg Oral Daily  . feeding supplement (GLUCERNA SHAKE)  237 mL Oral BID BM  . fluticasone  1 spray Each Nare Daily  . furosemide  80 mg Intravenous BID  . insulin aspart  0-9 Units Subcutaneous TID WC  . insulin glargine  10 Units Subcutaneous QHS  . levothyroxine  25 mcg Oral QAC breakfast  . metolazone  2.5 mg Oral Once  . multivitamin with minerals  1 tablet Oral Daily  . potassium chloride  40 mEq Oral BID  . umeclidinium bromide  1 puff Inhalation Daily    Infusions: . heparin 950 Units/hr (12/07/16 0000)  . milrinone 0.375 mcg/kg/min (12/07/16 0310)    PRN Medications: acetaminophen, albuterol, nitroGLYCERIN, ondansetron (ZOFRAN) IV, sodium chloride flush, traMADol, traZODone   Assessment/Plan/Discussion    1. Acute on chronic systolic CHF: Ischemic cardiomyopathy but with prominent RV dysfunction on echo and exam.  Medtronic ICD.  Echo this admission shows EF 25-30% with moderately dilated LV and moderately dilated/moderately dysfunction RV with moderate to severe TR.  He was admitted with low output biventricular failure, cardiorenal syndrome, and marked volume overload. He is improved with dual inotrope support.  He did not appear to have RV failure markedly out of proportion to LV on RHC, CVP/PCWP was 0.56 by RHC. Upon weaning down inotropic support and cutting back to po diuretics, CVP rose and co-ox is dropped to 44% => 55% with increase in milrinone to 0.25 => 88% with increase in milrinone to 0.375.  He diuresed yesterday, weight down 1 lb, CVP remains 14.  Creatinine 2.2 today.  - Continue IV Lasix today.  Will give 1 dose metolazone.    - Continue milrinone 0.375.   - Continue digoxin 0.0625 daily. Level ok today.    - Hold Entresto, spironolactone, and  hydralazine/Imdur with soft BP. - No BB with low output.  - LVAD work up ongoing. Has seen surgeon, HFSW, and VAD coordinator.  He failed attempt to wean down on support, tentative plan for LVAD next Friday  if he can be optimized.  2. CAD: s/p CABG.  No signs/sx of ischemia. Continue statin.  Does not need ASA with stable CAD on anticoagulation.  3. AKI on CKD stage III: Suspect cardiorenal syndrome in setting of low output failure/RV failure.  Renal function stabilized with inotropes, but creatinine increased with attempt to wean inotropes.  Now back down again on higher milrinone. 4. Atrial fibrillation: Chronic.  Rate is not elevated. - Currently stable on heparin gtt/off Eliquis.  5. Aortic stenosis s/p TAVR: Stable on echo.  6. NSVT: On amiodarone while we have him on inotropes. Keep K>4.0 Mg > 2.0.   7. Anemia: No overt bleeding.  Has had IV Fe this admission. Hgb lower today. Follow closely, hemoccult any stool.  8. Hyponatremia: Na increased with tolvaptan.  No more tolvaptan, will continue fluid restriction.   Loralie Champagne, MD  12/07/2016, 7:39 AM  Advanced Heart Failure Team Pager [phone] (M-F; 7a - 4p)  Please contact [facility] for night-coverage after hours (4p -7a ) and weekends on [email or web address]

## 2016-12-07 NOTE — Progress Notes (Addendum)
5 Days Post-Op Procedure(s) (LRB): Right Heart Cath (N/A) Subjective: Last cath 2015 shows native RCA open proximally and SVG to distal RCA patent Op note from 2010 indicates pericardium loosely closed  SVG to D-2, SVG tp PL, LIMA to D-1 TAVR note indicates 21mm XT valve placed Creat increased 2.2 , cvp about 10, SBP 85-105 Will give 1 unit PBC Objective: Vital signs in last 24 hours: Temp:  [97.4 F (36.3 C)-98.6 F (37 C)] 98.1 F (36.7 C) (02/03 1229) Pulse Rate:  [74-82] 74 (02/03 1229) Cardiac Rhythm: Atrial fibrillation (02/03 0700) Resp:  [12-18] 12 (02/03 1229) BP: (87-112)/(39-72) 101/66 (02/03 1229) SpO2:  [94 %-98 %] 95 % (02/03 1229) Weight:  [196 lb 11.2 oz (89.2 kg)] 196 lb 11.2 oz (89.2 kg) (02/03 0305)  Hemodynamic parameters for last 24 hours: CVP:  [11 mmHg-15 mmHg] 13 mmHg  Intake/Output from previous day: 02/02 0701 - 02/03 0700 In: 59 [P.O.:320; I.V.:500] Out: 2725 [Urine:2725] Intake/Output this shift: Total I/O In: 140 [I.V.:140] Out: 900 [Urine:900]  LE edema afib Lab Results:  Recent Labs  12/06/16 0525 12/07/16 0441  WBC 6.6 6.5  HGB 7.9* 7.8*  HCT 24.2* 23.6*  PLT 99* 108*   BMET:  Recent Labs  12/06/16 1140 12/07/16 0441  NA 128* 132*  K 3.8 4.0  CL 85* 92*  CO2 30 30  GLUCOSE 279* 196*  BUN 42* 46*  CREATININE 2.16* 2.26*  CALCIUM 9.2 9.0    PT/INR:  Recent Labs  12/07/16 0441  LABPROT 17.2*  INR 1.39   ABG    Component Value Date/Time   PHART 7.501 (H) 12/02/2016 1055   HCO3 33.1 (H) 12/02/2016 1055   TCO2 34 12/02/2016 1055   O2SAT 87.6 12/07/2016 0457   CBG (last 3)   Recent Labs  12/06/16 2136 12/07/16 0818 12/07/16 1229  GLUCAP 286* 167* 249*    Assessment/Plan: S/P Procedure(s) (LRB): Right Heart Cath (N/A) Decline in renal fx with falling Hb, stool guaiac - 1 unit PRBC  LOS: 9 days    Trevor [name] 12/07/2016

## 2016-12-08 LAB — COOXEMETRY PANEL
CARBOXYHEMOGLOBIN: 2 % — AB (ref 0.5–1.5)
Methemoglobin: 0.9 % (ref 0.0–1.5)
O2 SAT: 67.7 %
Total hemoglobin: 8.6 g/dL — ABNORMAL LOW (ref 12.0–16.0)

## 2016-12-08 LAB — CBC
HEMATOCRIT: 26.1 % — AB (ref 39.0–52.0)
HEMOGLOBIN: 8.7 g/dL — AB (ref 13.0–17.0)
MCH: 31.5 pg (ref 26.0–34.0)
MCHC: 33.3 g/dL (ref 30.0–36.0)
MCV: 94.6 fL (ref 78.0–100.0)
Platelets: 118 10*3/uL — ABNORMAL LOW (ref 150–400)
RBC: 2.76 MIL/uL — AB (ref 4.22–5.81)
RDW: 19.6 % — ABNORMAL HIGH (ref 11.5–15.5)
WBC: 7.9 10*3/uL (ref 4.0–10.5)

## 2016-12-08 LAB — TYPE AND SCREEN
Blood Product Expiration Date: 201802212359
ISSUE DATE / TIME: 201802032031
Unit Type and Rh: 600

## 2016-12-08 LAB — GLUCOSE, CAPILLARY
GLUCOSE-CAPILLARY: 244 mg/dL — AB (ref 65–99)
Glucose-Capillary: 169 mg/dL — ABNORMAL HIGH (ref 65–99)
Glucose-Capillary: 284 mg/dL — ABNORMAL HIGH (ref 65–99)
Glucose-Capillary: 271 mg/dL — ABNORMAL HIGH (ref 65–99)

## 2016-12-08 LAB — APTT
APTT: 59 s — AB (ref 24–36)
aPTT: 67 seconds — ABNORMAL HIGH (ref 24–36)

## 2016-12-08 LAB — PROTIME-INR
INR: 1.24
Prothrombin Time: 15.7 seconds — ABNORMAL HIGH (ref 11.4–15.2)

## 2016-12-08 LAB — BASIC METABOLIC PANEL
ANION GAP: 10 (ref 5–15)
BUN: 49 mg/dL — ABNORMAL HIGH (ref 6–20)
CALCIUM: 9.2 mg/dL (ref 8.9–10.3)
CHLORIDE: 90 mmol/L — AB (ref 101–111)
CO2: 31 mmol/L (ref 22–32)
Creatinine, Ser: 2.28 mg/dL — ABNORMAL HIGH (ref 0.61–1.24)
GFR calc Af Amer: 32 mL/min — ABNORMAL LOW (ref >60)
GFR calc non Af Amer: 27 mL/min — ABNORMAL LOW (ref >60)
GLUCOSE: 243 mg/dL — AB (ref 65–99)
Potassium: 4 mmol/L (ref 3.5–5.1)
Sodium: 131 mmol/L — ABNORMAL LOW (ref 135–145)

## 2016-12-08 LAB — HEPARIN LEVEL (UNFRACTIONATED): HEPARIN UNFRACTIONATED: 1.24 [IU]/mL — AB (ref 0.30–0.70)

## 2016-12-08 LAB — ABO/RH: ABO/RH(D): A NEG

## 2016-12-08 LAB — VITAMIN B12: Vitamin B-12: 922 pg/mL — ABNORMAL HIGH (ref 180–914)

## 2016-12-08 MED ORDER — NOREPINEPHRINE BITARTRATE 1 MG/ML IV SOLN
3.0000 ug/min | INTRAVENOUS | Status: DC
  Administered 2016-12-08 – 2016-12-09 (×2): 2 ug/min via INTRAVENOUS
  Administered 2016-12-10 – 2016-12-13 (×4): 3 ug/min via INTRAVENOUS
  Administered 2016-12-13: 5 ug/min via INTRAVENOUS
  Administered 2016-12-14 (×2): 12 ug/min via INTRAVENOUS
  Filled 2016-12-08 (×10): qty 4

## 2016-12-08 MED ORDER — HEPARIN (PORCINE) IN NACL 100-0.45 UNIT/ML-% IJ SOLN
1250.0000 [IU]/h | INTRAMUSCULAR | Status: DC
  Administered 2016-12-08: 1100 [IU]/h via INTRAVENOUS
  Administered 2016-12-09: 1250 [IU]/h via INTRAVENOUS
  Filled 2016-12-08 (×2): qty 250

## 2016-12-08 NOTE — Progress Notes (Signed)
Patient refused CPAP tonight 

## 2016-12-08 NOTE — Progress Notes (Signed)
6 Days Post-Op Procedure(s) (LRB): Right Heart Cath (N/A) Subjective: Patient's mixed venous saturation remains adequate on high-dose milrinone however creatinine remains significantly abnormal Hemoglobin improved 8.7 after 1 unit of packed cells Patient may need balloon pump and PA catheter to provide adequate optimization before VAD implant unless creatinine starts improving soon  Objective: Vital signs in last 24 hours: Temp:  [97.4 F (36.3 C)-98.8 F (37.1 C)] 98.6 F (37 C) (02/04 0700) Pulse Rate:  [72-87] 80 (02/04 0700) Cardiac Rhythm: Atrial fibrillation (02/04 0701) Resp:  [12-22] 21 (02/04 0700) BP: (98-122)/(56-81) 98/58 (02/04 0700) SpO2:  [95 %-98 %] 98 % (02/04 0807) Weight:  [196 lb (88.9 kg)] 196 lb (88.9 kg) (02/04 0300)  Hemodynamic parameters for last 24 hours: CVP:  [11 mmHg-16 mmHg] 11 mmHg  Intake/Output from previous day: 02/03 0701 - 02/04 0700 In: 1235.5 [P.O.:180; I.V.:720.5; Blood:335] Out: 2425 [Urine:2425] Intake/Output this shift: Total I/O In: 42 [I.V.:42] Out: 300 [Urine:300]  Mild-moderate lower extremity edema Rate controlled atrial fib Abdomen soft  Lab Results:  Recent Labs  12/07/16 0441 12/08/16 0334  WBC 6.5 7.9  HGB 7.8* 8.7*  HCT 23.6* 26.1*  PLT 108* 118*   BMET:  Recent Labs  12/07/16 0441 12/08/16 0334  NA 132* 131*  K 4.0 4.0  CL 92* 90*  CO2 30 31  GLUCOSE 196* 243*  BUN 46* 49*  CREATININE 2.26* 2.28*  CALCIUM 9.0 9.2    PT/INR:  Recent Labs  12/08/16 0334  LABPROT 15.7*  INR 1.24   ABG    Component Value Date/Time   PHART 7.501 (H) 12/02/2016 1055   HCO3 33.1 (H) 12/02/2016 1055   TCO2 34 12/02/2016 1055   O2SAT 67.7 12/08/2016 0359   CBG (last 3)   Recent Labs  12/07/16 1555 12/07/16 2113 12/08/16 0749  GLUCAP 268* 301* 169*    Assessment/Plan: S/P Procedure(s) (LRB): Right Heart Cath (N/A) VAD planned tentatively for February 9 if preop renal function Is adequate with  creatinine < 2   LOS: 10 days    Trevor [name] 12/08/2016

## 2016-12-08 NOTE — Progress Notes (Signed)
[place] for apixaban>>heparin Indication: atrial fibrillation  No Known Allergies  Patient Measurements: Height: 5\' 5"  (165.1 cm) Weight: 196 lb (88.9 kg) IBW/kg (Calculated) : 61.5 Heparin Dosing Weight: 88kg  Vital Signs: Temp: 97.9 F (36.6 C) (02/04 1500) Temp Source: Oral (02/04 1500) BP: 111/51 (02/04 1500) Pulse Rate: 76 (02/04 1610)  Labs:  Recent Labs  12/06/16 0525 12/06/16 0526 12/06/16 1140 12/07/16 0441 12/08/16 0334 12/08/16 1529  HGB 7.9*  --   --  7.8* 8.7*  --   HCT 24.2*  --   --  23.6* 26.1*  --   PLT 99*  --   --  108* 118*  --   APTT 72*  --  71* 68* 59* 67*  LABPROT 18.8*  --   --  17.2* 15.7*  --   INR 1.55  --   --  1.39 1.24  --   HEPARINUNFRC  --  >2.20*  --  2.16* 1.24*  --   CREATININE 1.79*  --  2.16* 2.26* 2.28*  --     Estimated Creatinine Clearance: 30.5 mL/min (by C-G formula based on SCr of 2.28 mg/dL (H)).   Medical History: Past Medical History:  Diagnosis Date  . Asthma   . CHF (congestive heart failure) ([place])   . Diabetes ([place])   . Kidney disease   . Sleep apnea    . heparin 1,100 Units/hr (12/08/16 1600)  . milrinone 0.375 mcg/kg/min (12/08/16 1600)  . norepinephrine (LEVOPHED) Adult infusion 2 mcg/min (12/08/16 1600)     Assessment: 71 y.o. male with medical history significant of CHF, CKD, DM2, asthma. Patient here for heart failure and volume overload. He is on apixaban prior to admission and this has been continued while hospitalized, his last dose was last night 1/31. Patient now undergoing LVAD workup and decision was made to transition to heparin prior to surgery -aPTT= 67 and at goal (heparin level elevated due to recent apixiban)  Goal of Therapy:  aPTT 66-102 seconds Monitor platelets by anticoagulation protocol: Yes   Plan:  No heparin changes Daily Heparin level, CBC, and aptt for now  Hildred Laser, Pharm D 12/08/2016 4:36 PM

## 2016-12-08 NOTE — Progress Notes (Signed)
Pt. Refused cpap. 

## 2016-12-08 NOTE — Progress Notes (Signed)
Patient ID: Trevor [name], male   DOB: [birth year], 71 y.o.   MRN: [id number]    Advanced Heart Failure Rounding Note   Subjective:    Admitted with marked volume overload and low output HF.  BP soft so he was started on milrinone 0.25 mcg + norepi 3 mcg.   Coox 44.4% 1/31 OFF norepi and on milrinone 0.125 mcg/kg/min. Milrinone increased to 0.375, co-ox 68%.  CVP 11-12 today.  Creatinine 1.74 -> 1.99 -> 2.2 -> 1.79 -> 2.26 -> 2.28.    He got 1 unit PRBCs on 2/3.  FOBT negative.   No complaints, doing some walking.     Echo: EF 25-30%, moderate LV dilation, bioprosthetic aortic valve ok, moderate MR, RV moderately dilated with moderate dysfunction, moderate to severe TR, PASP 51 mmHg.   Renal u/s with cortical thinning R 12.0 cm L 12.2 cm  RHC Procedural Findings (on milrinone 0.25 + norepinephrine 2): Hemodynamics (mmHg) RA mean 9 RV 52/11 PA 52/16, mean 31 PCWP mean 16 CVP/PCWP = 0.56 Oxygen saturations: PA 63% AO 95% Cardiac Output (Fick) 6.93  Cardiac Index (Fick) 3.55 PVR 2.1 WU Cardiac Output (Thermo) 6.16 Cardiac Index (Thermo) 3.16  PVR 2.4 WU  Objective:   Weight Range:  Vital Signs:   Temp:  [97.4 F (36.3 C)-98.8 F (37.1 C)] 98.4 F (36.9 C) (02/03 2326) Pulse Rate:  [72-87] 72 (02/03 2030) Resp:  [12-22] 20 (02/03 2326) BP: (87-122)/(39-81) 111/64 (02/03 2326) SpO2:  [95 %-98 %] 98 % (02/03 2326) Weight:  [196 lb (88.9 kg)] 196 lb (88.9 kg) (02/04 0300) Last BM Date: 12/06/16  Weight change: Filed Weights   12/06/16 0300 12/07/16 0305 12/08/16 0300  Weight: 197 lb 4.8 oz (89.5 kg) 196 lb 11.2 oz (89.2 kg) 196 lb (88.9 kg)    Intake/Output:   Intake/Output Summary (Last 24 hours) at 12/08/16 0735 Last data filed at 12/08/16 0600  Gross per 24 hour  Intake             1225 ml  Output             2250 ml  Net            -1025 ml     Physical Exam: CVP 11-12 General:  Elderly appearing. resp difficulty. Sitting in the chair  HEENT:  normal.  Neck: supple. JVP 10. Carotids 2+ bilat; no bruits. No thyromegaly or nodule noted.  Cor: PMI nondisplaced. Irregular rate & rhythm. 2/6 HSM LLSB, 2/6 SEM RUSB.  Lungs: Diminished basilar sounds, no crackles.  Abdomen: obese soft, NT, ND, no HSM. No bruits or masses. +BS  Extremities: no cyanosis, clubbing, rash.  1+ edema 3/4 to knees bilaterally.    Neuro: alert & orientedx3, cranial nerves grossly intact. moves all 4 extremities w/o difficulty. Affect pleasant  Telemetry: Reviewed,  A fib 70s-90s  Labs: Basic Metabolic Panel:  Recent Labs Lab 12/03/16 0500  12/04/16 0946 12/05/16 0400 12/06/16 0525 12/06/16 1140 12/07/16 0441 12/08/16 0334  NA 134*  < >  --  130* 122* 128* 132* 131*  K 3.4*  < >  --  4.3 2.8* 3.8 4.0 4.0  CL 89*  < >  --  89* 86* 85* 92* 90*  CO2 32  < >  --  29 25 30 30 31   GLUCOSE 146*  < >  --  176* 136* 279* 196* 243*  BUN 46*  < >  --  44* 37* 42* 46* 49*  CREATININE  1.79*  < >  --  2.20* 1.79* 2.16* 2.26* 2.28*  CALCIUM 9.0  < >  --  9.2 7.4* 9.2 9.0 9.2  MG 1.9  --  1.8  --   --   --   --   --   < > = values in this interval not displayed.  Liver Function Tests:  Recent Labs Lab 12/04/16 0642  AST 63*  ALT 54  ALKPHOS 113  BILITOT 1.1  PROT 5.9*  ALBUMIN 3.1*   No results for input(s): LIPASE, AMYLASE in the last 168 hours. No results for input(s): AMMONIA in the last 168 hours.  CBC:  Recent Labs Lab 12/04/16 0642 12/05/16 0400 12/06/16 0525 12/07/16 0441 12/08/16 0334  WBC 5.8 6.9 6.6 6.5 7.9  NEUTROABS  --  4.6  --   --   --   HGB 8.0* 8.5* 7.9* 7.8* 8.7*  HCT 24.3* 26.1* 24.2* 23.6* 26.1*  MCV 96.0 95.6 95.3 95.5 94.6  PLT 85* 98* 99* 108* 118*    Cardiac Enzymes: No results for input(s): CKTOTAL, CKMB, CKMBINDEX, TROPONINI in the last 168 hours.  BNP: BNP (last 3 results)  Recent Labs  11/28/16 1104  BNP 1,825.3*    ProBNP (last 3 results)  Recent Labs  11/27/16 1207  PROBNP 1,746.0*       Other results:  Imaging: Dg Chest 2 View  Result Date: 12/07/2016 CLINICAL DATA:  71 year old male with congestive heart failure. Initial encounter. EXAM: CHEST  2 VIEW COMPARISON:  Chest CT 12/03/2016 and earlier. FINDINGS: Stable right chest cardiac AICD. Stable right IJ central line. Stable postoperative changes to the mediastinum including CABG and cardiac valve replacement. Stable cardiomegaly and mediastinal contours. Resolved left lung base opacity since 11/28/2016. No pneumothorax, pulmonary edema or pleural effusion. Moderate thoracolumbar scoliosis. No acute osseous abnormality identified. Negative visible bowel gas pattern. Calcified aortic atherosclerosis. IMPRESSION: 1.  No acute cardiopulmonary abnormality. 2. Cardiomegaly with prior postoperative changes and Calcified aortic atherosclerosis. Electronically Signed   By: Genevie Ann M.D.   On: 12/07/2016 08:11     Medications:     Scheduled Medications: . amiodarone  200 mg Oral BID  . arformoterol  15 mcg Nebulization BID  . atorvastatin  40 mg Oral Daily  . budesonide (PULMICORT) nebulizer solution  0.5 mg Nebulization BID  . busPIRone  5 mg Oral BID  . digoxin  0.0625 mg Oral Daily  . feeding supplement (GLUCERNA SHAKE)  237 mL Oral BID BM  . fluticasone  1 spray Each Nare Daily  . furosemide  80 mg Intravenous BID  . insulin aspart  0-9 Units Subcutaneous TID WC  . insulin glargine  10 Units Subcutaneous QHS  . levothyroxine  25 mcg Oral QAC breakfast  . multivitamin with minerals  1 tablet Oral Daily  . potassium chloride  40 mEq Oral BID  . umeclidinium bromide  1 puff Inhalation Daily    Infusions: . heparin 1,100 Units/hr (12/08/16 0715)  . milrinone 0.375 mcg/kg/min (12/07/16 2114)  . norepinephrine (LEVOPHED) Adult infusion      PRN Medications: acetaminophen, albuterol, nitroGLYCERIN, ondansetron (ZOFRAN) IV, sodium chloride flush, traMADol, traZODone   Assessment/Plan/Discussion    1. Acute on  chronic systolic CHF: Ischemic cardiomyopathy but with prominent RV dysfunction on echo and exam.  [employer] ICD.  Echo this admission shows EF 25-30% with moderately dilated LV and moderately dilated/moderately dysfunction RV with moderate to severe TR.  He was admitted with low output biventricular failure, cardiorenal syndrome,  and marked volume overload. He is improved with dual inotrope support.  He did not appear to have RV failure markedly out of proportion to LV on RHC, CVP/PCWP was 0.56 by RHC. Upon weaning down inotropic support and cutting back to po diuretics, CVP rose and co-ox is dropped to 44% => 55% with increase in milrinone to 0.25 => 68% with increase in milrinone to 0.375.  Some diuresis yesterday, CVP 11-12.  Creatinine 2.28.  - Continue IV Lasix today, no metolazone.    - Continue milrinone 0.375, will add norepinephrine 2 back as renal function was best when he was on norepinephrine.   - Continue digoxin 0.0625 daily. Level ok.  - Hold Entresto, spironolactone, and hydralazine/Imdur with soft BP. - No BB with low output.  - LVAD work up ongoing. Has seen surgeon, HFSW, and VAD coordinator.  He failed attempt to wean down on support, tentative plan for LVAD next Friday if he can be optimized.  2. CAD: s/p CABG.  No signs/sx of ischemia. Continue statin.  Does not need ASA with stable CAD on anticoagulation.  3. AKI on CKD stage III: Suspect cardiorenal syndrome in setting of low output failure/RV failure.  Renal function stabilized with inotropes, but creatinine increased with attempt to wean inotropes.  It has slowly continued to rise, as above will add back norepinephrine to try to stabilize again (creatinine to 1.74 with combination norepinephrine and milrinone). 4. Atrial fibrillation: Chronic.  Rate is not elevated. - Currently stable on heparin gtt/off Eliquis.  5. Aortic stenosis s/p TAVR: Stable on echo.  6. NSVT: On amiodarone while we have him on inotropes. Keep K>4.0, Mg  > 2.0.   7. Anemia: No overt bleeding.  Has had IV Fe this admission. FOBT negative.  1 unit PRBCs on 2/2 with appropriate bump.  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  8. Hyponatremia: Na increased with tolvaptan.  No more tolvaptan, will continue fluid restriction.   Loralie Champagne, MD  12/08/2016, 7:35 AM  Advanced Heart Failure Team Pager [phone] (M-F; 7a - 4p)  Please contact [facility] for night-coverage after hours (4p -7a ) and weekends on [email or web address]

## 2016-12-08 NOTE — Progress Notes (Signed)
[place] for apixaban>>heparin Indication: atrial fibrillation  No Known Allergies  Patient Measurements: Height: 5\' 5"  (165.1 cm) Weight: 196 lb (88.9 kg) IBW/kg (Calculated) : 61.5 Heparin Dosing Weight: 88kg  Vital Signs: Temp: 98.4 F (36.9 C) (02/03 2326) Temp Source: Oral (02/03 2326) BP: 111/64 (02/03 2326) Pulse Rate: 72 (02/03 2030)  Labs:  Recent Labs  12/06/16 0525 12/06/16 0526 12/06/16 1140 12/07/16 0441 12/08/16 0334  HGB 7.9*  --   --  7.8* 8.7*  HCT 24.2*  --   --  23.6* 26.1*  PLT 99*  --   --  108* 118*  APTT 72*  --  71* 68* 59*  LABPROT 18.8*  --   --  17.2* 15.7*  INR 1.55  --   --  1.39 1.24  HEPARINUNFRC  --  >2.20*  --  2.16* 1.24*  CREATININE 1.79*  --  2.16* 2.26* 2.28*    Estimated Creatinine Clearance: 30.5 mL/min (by C-G formula based on SCr of 2.28 mg/dL (H)).   Medical History: Past Medical History:  Diagnosis Date  . Asthma   . CHF (congestive heart failure) ([place])   . Diabetes ([place])   . Kidney disease   . Sleep apnea    . heparin 1,100 Units/hr (12/08/16 0715)  . milrinone 0.375 mcg/kg/min (12/07/16 2114)     Assessment: 71 y.o. male with medical history significant of CHF, CKD, DM2, asthma. Patient here for heart failure and volume overload. He is on apixaban prior to admission and this has been continued while hospitalized, his last dose was last night 1/31. Patient now undergoing LVAD workup and decision was made to transition to heparin prior to surgery, will start this afternoon 2/1.   Heparin levels remain elevated d/t recent direct Xa inhibitor use, but PTT below goal this AM.  Spoke to RN, no issues with infusion.  No bleeding or complications noted, Hgb/Pltc low but stable.  Goal of Therapy:  aPTT 66-102 seconds Monitor platelets by anticoagulation protocol: Yes   Plan:  Increase IV heparin to 1100 units/hr Recheck PTT in 8 hrs. Daily Heparin level, CBC, and aptt for  now  [place] Crane, Vena [place], BCPS  Clinical Pharmacist Pager [phone]  12/08/2016 7:22 AM

## 2016-12-09 LAB — BASIC METABOLIC PANEL
ANION GAP: 10 (ref 5–15)
BUN: 48 mg/dL — ABNORMAL HIGH (ref 6–20)
CHLORIDE: 89 mmol/L — AB (ref 101–111)
CO2: 32 mmol/L (ref 22–32)
Calcium: 9.3 mg/dL (ref 8.9–10.3)
Creatinine, Ser: 2.07 mg/dL — ABNORMAL HIGH (ref 0.61–1.24)
GFR calc non Af Amer: 31 mL/min — ABNORMAL LOW (ref >60)
GFR, EST AFRICAN AMERICAN: 35 mL/min — AB (ref >60)
Glucose, Bld: 197 mg/dL — ABNORMAL HIGH (ref 65–99)
POTASSIUM: 4 mmol/L (ref 3.5–5.1)
SODIUM: 131 mmol/L — AB (ref 135–145)

## 2016-12-09 LAB — CBC
HCT: 26.7 % — ABNORMAL LOW (ref 39.0–52.0)
Hemoglobin: 8.7 g/dL — ABNORMAL LOW (ref 13.0–17.0)
MCH: 31.1 pg (ref 26.0–34.0)
MCHC: 32.6 g/dL (ref 30.0–36.0)
MCV: 95.4 fL (ref 78.0–100.0)
PLATELETS: 126 10*3/uL — AB (ref 150–400)
RBC: 2.8 MIL/uL — AB (ref 4.22–5.81)
RDW: 19.4 % — ABNORMAL HIGH (ref 11.5–15.5)
WBC: 8.3 10*3/uL (ref 4.0–10.5)

## 2016-12-09 LAB — GLUCOSE, CAPILLARY
GLUCOSE-CAPILLARY: 211 mg/dL — AB (ref 65–99)
GLUCOSE-CAPILLARY: 264 mg/dL — AB (ref 65–99)
Glucose-Capillary: 155 mg/dL — ABNORMAL HIGH (ref 65–99)
Glucose-Capillary: 184 mg/dL — ABNORMAL HIGH (ref 65–99)

## 2016-12-09 LAB — FACTOR 5 LEIDEN

## 2016-12-09 LAB — HEPARIN LEVEL (UNFRACTIONATED)
Heparin Unfractionated: 0.84 IU/mL — ABNORMAL HIGH (ref 0.30–0.70)
Heparin Unfractionated: 0.9 IU/mL — ABNORMAL HIGH (ref 0.30–0.70)
Heparin Unfractionated: 0.73 IU/mL — ABNORMAL HIGH (ref 0.30–0.70)

## 2016-12-09 LAB — FOLATE RBC
Folate, Hemolysate: >620 ng/mL
Folate, RBC: >2375 ng/mL (ref >498)
Hematocrit: 26.1 % — ABNORMAL LOW (ref 37.5–51.0)

## 2016-12-09 LAB — APTT
APTT: 59 s — AB (ref 24–36)
APTT: 69 s — AB (ref 24–36)
aPTT: 136 seconds — ABNORMAL HIGH (ref 24–36)

## 2016-12-09 LAB — PROTIME-INR
INR: 1.2
Prothrombin Time: 15.3 seconds — ABNORMAL HIGH (ref 11.4–15.2)

## 2016-12-09 LAB — COOXEMETRY PANEL
CARBOXYHEMOGLOBIN: 1.8 % — AB (ref 0.5–1.5)
Methemoglobin: 1.1 % (ref 0.0–1.5)
O2 Saturation: 64.3 %
Total hemoglobin: 8.8 g/dL — ABNORMAL LOW (ref 12.0–16.0)

## 2016-12-09 MED ORDER — INSULIN ASPART 100 UNIT/ML ~~LOC~~ SOLN
4.0000 [IU] | Freq: Three times a day (TID) | SUBCUTANEOUS | Status: DC
  Administered 2016-12-09 – 2016-12-12 (×9): 4 [IU] via SUBCUTANEOUS

## 2016-12-09 MED ORDER — INSULIN ASPART 100 UNIT/ML ~~LOC~~ SOLN
0.0000 [IU] | Freq: Every day | SUBCUTANEOUS | Status: DC
  Administered 2016-12-09: 3 [IU] via SUBCUTANEOUS

## 2016-12-09 MED ORDER — HEPARIN (PORCINE) IN NACL 100-0.45 UNIT/ML-% IJ SOLN
1050.0000 [IU]/h | INTRAMUSCULAR | Status: DC
  Administered 2016-12-10 – 2016-12-11 (×2): 1050 [IU]/h via INTRAVENOUS
  Filled 2016-12-09 (×2): qty 250

## 2016-12-09 MED ORDER — INSULIN ASPART 100 UNIT/ML ~~LOC~~ SOLN
0.0000 [IU] | Freq: Three times a day (TID) | SUBCUTANEOUS | Status: DC
  Administered 2016-12-09 – 2016-12-10 (×2): 3 [IU] via SUBCUTANEOUS
  Administered 2016-12-10: 5 [IU] via SUBCUTANEOUS
  Administered 2016-12-11 (×2): 3 [IU] via SUBCUTANEOUS
  Administered 2016-12-12: 2 [IU] via SUBCUTANEOUS
  Administered 2016-12-12: 3 [IU] via SUBCUTANEOUS
  Administered 2016-12-12: 2 [IU] via SUBCUTANEOUS

## 2016-12-09 NOTE — Progress Notes (Signed)
0958 Read note that pt now on neo. We will continue to follow and hold ambulation at this time. Graylon Good RN BSN 12/09/2016 10:00 AM

## 2016-12-09 NOTE — Progress Notes (Signed)
7 Days Post-Op Procedure(s) (LRB): Right Heart Cath (N/A) Subjective: Patient states he feels better today Lost 3 pounds of weight Creatinine improved 2.2 >> 2.0 CVP remains greater than 10  Objective: Vital signs in last 24 hours: Temp:  [97.9 F (36.6 C)-98.9 F (37.2 C)] 97.9 F (36.6 C) (02/05 1229) Pulse Rate:  [76-91] 91 (02/05 1229) Cardiac Rhythm: Atrial fibrillation (02/05 1200) Resp:  [13-20] 14 (02/05 1229) BP: (106-127)/(57-77) 117/57 (02/05 1229) SpO2:  [96 %-99 %] 96 % (02/05 1229) Weight:  [193 lb 9.6 oz (87.8 kg)] 193 lb 9.6 oz (87.8 kg) (02/05 0325)  Hemodynamic parameters for last 24 hours: CVP:  [11 mmHg-13 mmHg] 12 mmHg  Intake/Output from previous day: 02/04 0701 - 02/05 0700 In: 714.7 [P.O.:250; I.V.:464.7] Out: 2900 [Urine:2900] Intake/Output this shift: Total I/O In: 150 [P.O.:150] Out: 700 [Urine:700] Exam Lower extremity edema is better Systolic murmur TR Atrial fibrillation  Lab Results:  Recent Labs  12/08/16 0334 12/09/16 0342  WBC 7.9 8.3  HGB 8.7* 8.7*  HCT 26.1*  26.1* 26.7*  PLT 118* 126*   BMET:  Recent Labs  12/08/16 0334 12/09/16 0342  NA 131* 131*  K 4.0 4.0  CL 90* 89*  CO2 31 32  GLUCOSE 243* 197*  BUN 49* 48*  CREATININE 2.28* 2.07*  CALCIUM 9.2 9.3    PT/INR:  Recent Labs  12/09/16 0342  LABPROT 15.3*  INR 1.20   ABG    Component Value Date/Time   PHART 7.501 (H) 12/02/2016 1055   HCO3 33.1 (H) 12/02/2016 1055   TCO2 34 12/02/2016 1055   O2SAT 64.3 12/09/2016 0330   CBG (last 3)   Recent Labs  12/08/16 2204 12/09/16 0828 12/09/16 1228  GLUCAP 271* 155* 211*    Assessment/Plan: S/P Procedure(s) (LRB): Right Heart Cath (N/A) Continue to optimize patient for possible HeartMate 2 VAD with combined tricuspid valve annuloplasty   LOS: 11 days    Tharon Aquas Trigt III 12/09/2016

## 2016-12-09 NOTE — Progress Notes (Signed)
Inpatient Diabetes Program Recommendations  AACE/ADA: New Consensus Statement on Inpatient Glycemic Control (2015)  Target Ranges:  Prepandial:   less than 140 mg/dL      Peak postprandial:   less than 180 mg/dL (1-2 hours)      Critically ill patients:  140 - 180 mg/dL   Lab Results  Component Value Date   GLUCAP 155 (H) 12/09/2016   HGBA1C 7.1 (H) 12/03/2016    Review of Glycemic Control Results for MELBOURNE, WHITEAKER (MRN [id number]) as of 12/09/2016 09:48  Ref. Range 12/08/2016 07:49 12/08/2016 11:23 12/08/2016 16:12 12/08/2016 22:04 12/09/2016 08:28  Glucose-Capillary Latest Ref Range: 65 - 99 mg/dL 169 (H) 244 (H) 284 (H) 271 (H) 155 (H)   Home DM Meds: Lantus 20 units QHS                             Humalog 50 units TID  Current Insulin Orders: Lantus 10 units QHS                                       Novolog Sensitive Correction Scale/ SSI (0-9 units) TID AC                                        MD- Please consider starting Novolog Meal Coverage:  Novolog 4 units TID with meals (hold if pt eats <50% of meal)  Thank you, Bethena Roys E. Doyce Saling, RN, MSN, CDE Inpatient Glycemic Control Team Team Pager [phone] (8am-5pm) 12/09/2016 9:48 AM

## 2016-12-09 NOTE — Progress Notes (Signed)
ANTICOAGULATION CONSULT NOTE - Follow Up Consult  Pharmacy Consult for Heparin (apixaban on hold) Indication: atrial fibrillation  No Known Allergies  Patient Measurements: Height: 5\' 5"  (165.1 cm) Weight: 193 lb 9.6 oz (87.8 kg) IBW/kg (Calculated) : 61.5  Vital Signs: Temp: 98 F (36.7 C) (02/05 0325) Temp Source: Oral (02/05 0325) BP: 106/59 (02/05 0325)  Labs:  Recent Labs  12/07/16 0441 12/08/16 0334 12/08/16 1529 12/09/16 0342  HGB 7.8* 8.7*  --  8.7*  HCT 23.6* 26.1*  --  26.7*  PLT 108* 118*  --  126*  APTT 68* 59* 67* 59*  LABPROT 17.2* 15.7*  --  15.3*  INR 1.39 1.24  --  1.20  HEPARINUNFRC 2.16* 1.24*  --  0.84*  CREATININE 2.26* 2.28*  --  2.07*    Estimated Creatinine Clearance: 33.3 mL/min (by C-G formula based on SCr of 2.07 mg/dL (H)).    Assessment: 71 y.o.malewith medical history significant of CHF, CKD, DM2, asthma. Patient here for heart failure and volume overload. He is on apixaban prior to admission and this has been continued while hospitalized, his last dose was last night 1/31. Patient now undergoing LVAD workup and decision was made to transition to heparin prior to surgery, will start this afternoon 2/1.   Heparin levels remain elevated d/t recent direct Xa inhibitor use, but PTT is below goal this AM.  No bleeding or complications noted, Hgb/Pltc low but stable.  Goal of Therapy:  Heparin level 0.3-0.7 units/ml aPTT 66-102 seconds Monitor platelets by anticoagulation protocol: Yes   Plan:  -Inc heparin to 1250 units/hr -1300 aPTT/HL  Narda Bonds 12/09/2016,4:29 AM

## 2016-12-09 NOTE — Progress Notes (Signed)
ANTICOAGULATION CONSULT NOTE - Follow Up Consult  Pharmacy Consult for Heparin Indication: atrial fibrillation  No Known Allergies  Patient Measurements: Height: 5\' 5"  (165.1 cm) Weight: 193 lb 9.6 oz (87.8 kg) IBW/kg (Calculated) : 61.5  Vital Signs: Temp: 97.9 F (36.6 C) (02/05 1229) Temp Source: Oral (02/05 1229) BP: 117/57 (02/05 1229) Pulse Rate: 91 (02/05 1229)  Labs:  Recent Labs  12/07/16 0441 12/08/16 0334 12/08/16 1529 12/09/16 0342 12/09/16 1216  HGB 7.8* 8.7*  --  8.7*  --   HCT 23.6* 26.1*  --  26.7*  --   PLT 108* 118*  --  126*  --   APTT 68* 59* 67* 59* 136*  LABPROT 17.2* 15.7*  --  15.3*  --   INR 1.39 1.24  --  1.20  --   HEPARINUNFRC 2.16* 1.24*  --  0.84* 0.90*  CREATININE 2.26* 2.28*  --  2.07*  --     Estimated Creatinine Clearance: 33.3 mL/min (by C-G formula based on SCr of 2.07 mg/dL (H)).   Medications:  Heparin @ 1250 units/hr  Assessment: 70yom on apixaban pta for afib (last dose 1/31 @ 2110), transitioned to IV heparin anticipating LVAD placement (tentatively scheduled for 2/9).   APTT is now supratherapeutic at 136 seconds after rate increase this morning. Heparin level has also increased from 0.84 to 0.90. Verified with RN that labs were drawn correctly.   Goal of Therapy:  Heparin level 0.3-0.7 aPTT 66-102 seconds Monitor platelets by anticoagulation protocol: Yes   Plan:  1) Decrease heparin to 1150 units/hr 2) Check 8 hour aPTT and heparin level  Deboraha Sprang 12/09/2016,1:27 PM

## 2016-12-09 NOTE — Progress Notes (Signed)
ANTICOAGULATION CONSULT NOTE - Follow Up Consult  Pharmacy Consult for Heparin Indication: atrial fibrillation  No Known Allergies  Patient Measurements: Height: 5\' 5"  (165.1 cm) Weight: 193 lb 9.6 oz (87.8 kg) IBW/kg (Calculated) : 61.5  Vital Signs: Temp: 98.3 F (36.8 C) (02/05 2006) Temp Source: Oral (02/05 2006) BP: 112/66 (02/05 1957) Pulse Rate: 77 (02/05 1605)  Labs:  Recent Labs  12/07/16 0441 12/08/16 0334  12/09/16 0342 12/09/16 1216 12/09/16 2130  HGB 7.8* 8.7*  --  8.7*  --   --   HCT 23.6* 26.1*  26.1*  --  26.7*  --   --   PLT 108* 118*  --  126*  --   --   APTT 68* 59*  < > 59* 136* 69*  LABPROT 17.2* 15.7*  --  15.3*  --   --   INR 1.39 1.24  --  1.20  --   --   HEPARINUNFRC 2.16* 1.24*  --  0.84* 0.90* 0.73*  CREATININE 2.26* 2.28*  --  2.07*  --   --   < > = values in this interval not displayed.  Estimated Creatinine Clearance: 33.3 mL/min (by C-G formula based on SCr of 2.07 mg/dL (H)).   Medications:  Heparin @ 1250 units/hr  Assessment: 70yom on apixaban pta for afib (last dose 1/31 @ 2110), transitioned to IV heparin anticipating LVAD placement (tentatively scheduled for 2/9).   APTT is now therapeutic at 69 seconds after rate decrease this morning. Heparin level just above goal at 0.73. At this point we would assume apixaban has cleared and the heparin level is accurate, will reduce rate slightly and recheck in am.   Goal of Therapy:  Heparin level 0.3-0.7 aPTT 66-102 seconds Monitor platelets by anticoagulation protocol: Yes   Plan:  1) Decrease heparin to 1050 units/hr 2) Check 8 hour aPTT and heparin level  Erin Hearing PharmD., BCPS Clinical Pharmacist Pager [phone] 12/09/2016 10:53 PM

## 2016-12-09 NOTE — Progress Notes (Signed)
Patient ID: Trevor [name], male   DOB: 01/07/1946, 71 y.o.   MRN: [id number]    Advanced Heart Failure Rounding Note   Subjective:    Admitted with marked volume overload and low output HF.  BP soft so he was started on milrinone 0.25 mcg + norepi 3 mcg.   Coox 44.4% 1/31 OFF norepi and on milrinone 0.125 mcg/kg/min. Milrinone increased to 0.375 and norepinephrine 2 added, co-ox 64%.  CVP 12-13 today.  Weight down 3 lbs.   Creatinine 1.74 -> 1.99 -> 2.2 -> 1.79 -> 2.26 -> 2.28 -> 2.07.    He got 1 unit PRBCs on 2/3.  FOBT negative.   No complaints, doing some walking.     Echo: EF 25-30%, moderate LV dilation, bioprosthetic aortic valve ok, moderate MR, RV moderately dilated with moderate dysfunction, moderate to severe TR, PASP 51 mmHg.   Renal u/s with cortical thinning R 12.0 cm L 12.2 cm  RHC Procedural Findings (on milrinone 0.25 + norepinephrine 2): Hemodynamics (mmHg) RA mean 9 RV 52/11 PA 52/16, mean 31 PCWP mean 16 CVP/PCWP = 0.56 Oxygen saturations: PA 63% AO 95% Cardiac Output (Fick) 6.93  Cardiac Index (Fick) 3.55 PVR 2.1 WU Cardiac Output (Thermo) 6.16 Cardiac Index (Thermo) 3.16  PVR 2.4 WU  Objective:   Weight Range:  Vital Signs:   Temp:  [97.9 F (36.6 C)-98.9 F (37.2 C)] 98 F (36.7 C) (02/05 0325) Pulse Rate:  [76] 76 (02/04 1610) Resp:  [13-20] 20 (02/05 0325) BP: (106-127)/(40-77) 106/59 (02/05 0325) SpO2:  [97 %-99 %] 97 % (02/05 0325) Weight:  [193 lb 9.6 oz (87.8 kg)] 193 lb 9.6 oz (87.8 kg) (02/05 0325) Last BM Date: 12/08/16  Weight change: Filed Weights   12/07/16 0305 12/08/16 0300 12/09/16 0325  Weight: 196 lb 11.2 oz (89.2 kg) 196 lb (88.9 kg) 193 lb 9.6 oz (87.8 kg)    Intake/Output:   Intake/Output Summary (Last 24 hours) at 12/09/16 0737 Last data filed at 12/09/16 0600  Gross per 24 hour  Intake           684.23 ml  Output             2900 ml  Net         -2215.77 ml     Physical Exam: CVP 12-13 General:   Elderly appearing. resp difficulty. Sitting in the chair  HEENT: normal.  Neck: supple. JVP 10. Carotids 2+ bilat; no bruits. No thyromegaly or nodule noted.  Cor: PMI nondisplaced. Irregular rate & rhythm. 2/6 HSM LLSB, 2/6 SEM RUSB.  Lungs: Diminished basilar sounds, no crackles.  Abdomen: obese soft, NT, ND, no HSM. No bruits or masses. +BS  Extremities: no cyanosis, clubbing, rash.  1+ edema 3/4 to knees bilaterally.    Neuro: alert & orientedx3, cranial nerves grossly intact. moves all 4 extremities w/o difficulty. Affect pleasant  Telemetry: Reviewed,  A fib 70s-90s  Labs: Basic Metabolic Panel:  Recent Labs Lab 12/03/16 0500  12/04/16 0946  12/06/16 0525 12/06/16 1140 12/07/16 0441 12/08/16 0334 12/09/16 0342  NA 134*  < >  --   < > 122* 128* 132* 131* 131*  K 3.4*  < >  --   < > 2.8* 3.8 4.0 4.0 4.0  CL 89*  < >  --   < > 86* 85* 92* 90* 89*  CO2 32  < >  --   < > 25 30 30 31  32  GLUCOSE 146*  < >  --   < >  136* 279* 196* 243* 197*  BUN 46*  < >  --   < > 37* 42* 46* 49* 48*  CREATININE 1.79*  < >  --   < > 1.79* 2.16* 2.26* 2.28* 2.07*  CALCIUM 9.0  < >  --   < > 7.4* 9.2 9.0 9.2 9.3  MG 1.9  --  1.8  --   --   --   --   --   --   < > = values in this interval not displayed.  Liver Function Tests:  Recent Labs Lab 12/04/16 0642  AST 63*  ALT 54  ALKPHOS 113  BILITOT 1.1  PROT 5.9*  ALBUMIN 3.1*   No results for input(s): LIPASE, AMYLASE in the last 168 hours. No results for input(s): AMMONIA in the last 168 hours.  CBC:  Recent Labs Lab 12/05/16 0400 12/06/16 0525 12/07/16 0441 12/08/16 0334 12/09/16 0342  WBC 6.9 6.6 6.5 7.9 8.3  NEUTROABS 4.6  --   --   --   --   HGB 8.5* 7.9* 7.8* 8.7* 8.7*  HCT 26.1* 24.2* 23.6* 26.1* 26.7*  MCV 95.6 95.3 95.5 94.6 95.4  PLT 98* 99* 108* 118* 126*    Cardiac Enzymes: No results for input(s): CKTOTAL, CKMB, CKMBINDEX, TROPONINI in the last 168 hours.  BNP: BNP (last 3 results)  Recent Labs   11/28/16 1104  BNP 1,825.3*    ProBNP (last 3 results)  Recent Labs  11/27/16 1207  PROBNP 1,746.0*      Other results:  Imaging: No results found.   Medications:     Scheduled Medications: . amiodarone  200 mg Oral BID  . arformoterol  15 mcg Nebulization BID  . atorvastatin  40 mg Oral Daily  . budesonide (PULMICORT) nebulizer solution  0.5 mg Nebulization BID  . busPIRone  5 mg Oral BID  . digoxin  0.0625 mg Oral Daily  . feeding supplement (GLUCERNA SHAKE)  237 mL Oral BID BM  . fluticasone  1 spray Each Nare Daily  . furosemide  80 mg Intravenous BID  . insulin aspart  0-9 Units Subcutaneous TID WC  . insulin glargine  10 Units Subcutaneous QHS  . levothyroxine  25 mcg Oral QAC breakfast  . multivitamin with minerals  1 tablet Oral Daily  . potassium chloride  40 mEq Oral BID  . umeclidinium bromide  1 puff Inhalation Daily    Infusions: . heparin 1,250 Units/hr (12/09/16 0431)  . milrinone 0.375 mcg/kg/min (12/08/16 2254)  . norepinephrine (LEVOPHED) Adult infusion 2 mcg/min (12/08/16 1900)    PRN Medications: acetaminophen, albuterol, nitroGLYCERIN, ondansetron (ZOFRAN) IV, sodium chloride flush, traMADol, traZODone   Assessment/Plan/Discussion    1. Acute on chronic systolic CHF: Ischemic cardiomyopathy but with prominent RV dysfunction on echo and exam.  Medtronic ICD.  Echo this admission shows EF 25-30% with moderately dilated LV and moderately dilated/moderately dysfunction RV with moderate to severe TR.  He was admitted with low output biventricular failure, cardiorenal syndrome, and marked volume overload. He is improved with dual inotrope support.  He did not appear to have RV failure markedly out of proportion to LV on RHC, CVP/PCWP was 0.56 by RHC. Upon weaning down inotropic support and cutting back to po diuretics, CVP rose and co-ox is dropped to 44% => 55% with increase in milrinone to 0.25 => 68% with increase in milrinone to 0.375.   Norepinephrine added back on 2/4, creatinine down to 2.07 today with CVP 12-13 and co-ox  64%.  Good diuresis with weight down 3 lbs. - Continue IV Lasix today, no metolazone (good diuresis with this yesterday).     - Continue milrinone 0.375 + norepinephrine 2.  Creatinine better today.  Will recheck tomorrow, if creatinine does not continue to improve I think IABP/[place] would be next step to optimize prior to surgery.   - Continue digoxin 0.0625 daily. Level ok.  - Hold Entresto, spironolactone, and hydralazine/Imdur with soft BP. - No BB with low output.  - LVAD work up ongoing. Has seen surgeon, HFSW, and VAD coordinator.  He failed attempt to wean down on support, tentative plan for LVAD Friday if he can be optimized.  2. CAD: s/p CABG.  No signs/sx of ischemia. Continue statin.  Does not need ASA with stable CAD on anticoagulation.  3. AKI on CKD stage III: Suspect cardiorenal syndrome in setting of low output failure/RV failure.  Renal function stabilized with inotropes, but creatinine increased with attempt to wean inotropes.  Back on combination of milrinone + norepinephrine, creatinine is lower today at 2.07. 4. Atrial fibrillation: Chronic.  Rate is not elevated. - Currently stable on heparin gtt/off Eliquis.  5. Aortic stenosis s/p TAVR: Stable on echo.  6. NSVT: On amiodarone while we have him on inotropes. Keep K>4.0, Mg > 2.0.   7. Anemia: No overt bleeding.  Has had IV Fe this admission. FOBT negative.  1 unit PRBCs on 2/2 with appropriate bump.  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  8. Hyponatremia: Na increased with tolvaptan.  No more tolvaptan, will continue fluid restriction.   Loralie Champagne, MD  12/09/2016, 7:37 AM  Advanced Heart Failure Team Pager [phone] (M-F; 7a - 4p)  Please contact [facility] for night-coverage after hours (4p -7a ) and weekends on [email or web address]

## 2016-12-10 ENCOUNTER — Encounter (HOSPITAL_COMMUNITY): Payer: Self-pay | Admitting: Certified Registered Nurse Anesthetist

## 2016-12-10 ENCOUNTER — Inpatient Hospital Stay (HOSPITAL_COMMUNITY): Payer: Medicare Other

## 2016-12-10 DIAGNOSIS — I509 Heart failure, unspecified: Secondary | ICD-10-CM

## 2016-12-10 LAB — GLUCOSE, CAPILLARY
GLUCOSE-CAPILLARY: 155 mg/dL — AB (ref 65–99)
Glucose-Capillary: 177 mg/dL — ABNORMAL HIGH (ref 65–99)
Glucose-Capillary: 133 mg/dL — ABNORMAL HIGH (ref 65–99)
Glucose-Capillary: 219 mg/dL — ABNORMAL HIGH (ref 65–99)

## 2016-12-10 LAB — COOXEMETRY PANEL
CARBOXYHEMOGLOBIN: 1.7 % — AB (ref 0.5–1.5)
METHEMOGLOBIN: 1 % (ref 0.0–1.5)
O2 Saturation: 59.1 %
Total hemoglobin: 11.3 g/dL — ABNORMAL LOW (ref 12.0–16.0)

## 2016-12-10 LAB — CBC
HCT: 26.5 % — ABNORMAL LOW (ref 39.0–52.0)
Hemoglobin: 8.7 g/dL — ABNORMAL LOW (ref 13.0–17.0)
MCH: 31.4 pg (ref 26.0–34.0)
MCHC: 32.8 g/dL (ref 30.0–36.0)
MCV: 95.7 fL (ref 78.0–100.0)
PLATELETS: 132 10*3/uL — AB (ref 150–400)
RBC: 2.77 MIL/uL — AB (ref 4.22–5.81)
RDW: 19.1 % — ABNORMAL HIGH (ref 11.5–15.5)
WBC: 8.1 10*3/uL (ref 4.0–10.5)

## 2016-12-10 LAB — BASIC METABOLIC PANEL
ANION GAP: 10 (ref 5–15)
Anion gap: 12 (ref 5–15)
BUN: 47 mg/dL — ABNORMAL HIGH (ref 6–20)
BUN: 46 mg/dL — AB (ref 6–20)
CALCIUM: 9.6 mg/dL (ref 8.9–10.3)
CO2: 32 mmol/L (ref 22–32)
CO2: 31 mmol/L (ref 22–32)
Calcium: 9.3 mg/dL (ref 8.9–10.3)
Chloride: 89 mmol/L — ABNORMAL LOW (ref 101–111)
Chloride: 90 mmol/L — ABNORMAL LOW (ref 101–111)
Creatinine, Ser: 1.91 mg/dL — ABNORMAL HIGH (ref 0.61–1.24)
Creatinine, Ser: 2.04 mg/dL — ABNORMAL HIGH (ref 0.61–1.24)
GFR calc Af Amer: 36 mL/min — ABNORMAL LOW (ref >60)
GFR, EST AFRICAN AMERICAN: 39 mL/min — AB (ref >60)
GFR, EST NON AFRICAN AMERICAN: 34 mL/min — AB (ref >60)
GFR, EST NON AFRICAN AMERICAN: 31 mL/min — AB (ref >60)
GLUCOSE: 188 mg/dL — AB (ref 65–99)
Glucose, Bld: 191 mg/dL — ABNORMAL HIGH (ref 65–99)
POTASSIUM: 4.2 mmol/L (ref 3.5–5.1)
Potassium: 4.2 mmol/L (ref 3.5–5.1)
SODIUM: 131 mmol/L — AB (ref 135–145)
Sodium: 133 mmol/L — ABNORMAL LOW (ref 135–145)

## 2016-12-10 LAB — PROTIME-INR
INR: 1.28
Prothrombin Time: 16.1 seconds — ABNORMAL HIGH (ref 11.4–15.2)

## 2016-12-10 LAB — HEPARIN LEVEL (UNFRACTIONATED): Heparin Unfractionated: 0.7 IU/mL (ref 0.30–0.70)

## 2016-12-10 LAB — ECHOCARDIOGRAM LIMITED
Height: 65 in
Weight: 3073.6 oz

## 2016-12-10 LAB — APTT: APTT: 62 s — AB (ref 24–36)

## 2016-12-10 MED ORDER — POTASSIUM CHLORIDE CRYS ER 20 MEQ PO TBCR
40.0000 meq | EXTENDED_RELEASE_TABLET | Freq: Once | ORAL | Status: AC
  Administered 2016-12-10: 40 meq via ORAL
  Filled 2016-12-10: qty 2

## 2016-12-10 MED ORDER — METOLAZONE 2.5 MG PO TABS
2.5000 mg | ORAL_TABLET | Freq: Once | ORAL | Status: AC
  Administered 2016-12-10: 2.5 mg via ORAL
  Filled 2016-12-10: qty 1

## 2016-12-10 MED ORDER — PERFLUTREN LIPID MICROSPHERE
INTRAVENOUS | Status: AC
  Filled 2016-12-10: qty 10

## 2016-12-10 MED ORDER — PERFLUTREN LIPID MICROSPHERE
1.0000 mL | INTRAVENOUS | Status: AC | PRN
  Administered 2016-12-10: 2 mL via INTRAVENOUS
  Filled 2016-12-10: qty 10

## 2016-12-10 NOTE — Progress Notes (Signed)
Patient ID: Trevor [name], male   DOB: 11-09-1945, 71 y.o.   MRN: [id number]    Advanced Heart Failure Rounding Note   Subjective:    Admitted with marked volume overload and low output HF.  BP soft so he was started on milrinone 0.25 mcg + norepi 3 mcg.   Coox 44.4% 1/31 OFF norepi and on milrinone 0.125 mcg/kg/min. Milrinone increased to 0.375 and norepinephrine 2 added, co-ox 59%.  CVP 14 today.  Weight down 1 lb.   Creatinine 1.74 -> 1.99 -> 2.2 -> 1.79 -> 2.26 -> 2.28 -> 2.07 -> 1.9.    He got 1 unit PRBCs on 2/3.  FOBT negative.   No complaints, doing some walking.     Echo: EF 25-30%, moderate LV dilation, bioprosthetic aortic valve ok, moderate MR, RV moderately dilated with moderate dysfunction, moderate to severe TR, PASP 51 mmHg.   Renal u/s with cortical thinning R 12.0 cm L 12.2 cm  RHC Procedural Findings (on milrinone 0.25 + norepinephrine 2): Hemodynamics (mmHg) RA mean 9 RV 52/11 PA 52/16, mean 31 PCWP mean 16 CVP/PCWP = 0.56 Oxygen saturations: PA 63% AO 95% Cardiac Output (Fick) 6.93  Cardiac Index (Fick) 3.55 PVR 2.1 WU Cardiac Output (Thermo) 6.16 Cardiac Index (Thermo) 3.16  PVR 2.4 WU  Objective:   Weight Range:  Vital Signs:   Temp:  [97.9 F (36.6 C)-98.3 F (36.8 C)] 98.2 F (36.8 C) (02/05 2349) Pulse Rate:  [77-91] 77 (02/05 1605) Resp:  [13-18] 18 (02/05 2349) BP: (96-117)/(49-72) 96/49 (02/05 2349) SpO2:  [96 %-98 %] 97 % (02/05 2349) Weight:  [192 lb 1.6 oz (87.1 kg)] 192 lb 1.6 oz (87.1 kg) (02/06 0500) Last BM Date: 12/08/16  Weight change: Filed Weights   12/08/16 0300 12/09/16 0325 12/10/16 0500  Weight: 196 lb (88.9 kg) 193 lb 9.6 oz (87.8 kg) 192 lb 1.6 oz (87.1 kg)    Intake/Output:   Intake/Output Summary (Last 24 hours) at 12/10/16 0729 Last data filed at 12/09/16 2354  Gross per 24 hour  Intake              350 ml  Output             1250 ml  Net             -900 ml     Physical Exam: CVP  14 General:  Elderly appearing. resp difficulty. Sitting in the chair  HEENT: normal.  Neck: supple. JVP 10-12. Carotids 2+ bilat; no bruits. No thyromegaly or nodule noted.  Cor: PMI nondisplaced. Irregular rate & rhythm. 2/6 HSM LLSB, 2/6 SEM RUSB.  Lungs: Diminished basilar sounds, no crackles.  Abdomen: obese soft, NT, ND, no HSM. No bruits or masses. +BS  Extremities: no cyanosis, clubbing, rash.  1+ edema 3/4 to knees bilaterally.    Neuro: alert & orientedx3, cranial nerves grossly intact. moves all 4 extremities w/o difficulty. Affect pleasant  Telemetry: Reviewed,  A fib 70s-90s  Labs: Basic Metabolic Panel:  Recent Labs Lab 12/04/16 0946  12/06/16 1140 12/07/16 0441 12/08/16 0334 12/09/16 0342 12/10/16 0445  NA  --   < > 128* 132* 131* 131* 131*  K  --   < > 3.8 4.0 4.0 4.0 4.2  CL  --   < > 85* 92* 90* 89* 89*  CO2  --   < > 30 30 31  32 32  GLUCOSE  --   < > 279* 196* 243* 197* 191*  BUN  --   < >  42* 46* 49* 48* 47*  CREATININE  --   < > 2.16* 2.26* 2.28* 2.07* 1.91*  CALCIUM  --   < > 9.2 9.0 9.2 9.3 9.3  MG 1.8  --   --   --   --   --   --   < > = values in this interval not displayed.  Liver Function Tests:  Recent Labs Lab 12/04/16 0642  AST 63*  ALT 54  ALKPHOS 113  BILITOT 1.1  PROT 5.9*  ALBUMIN 3.1*   No results for input(s): LIPASE, AMYLASE in the last 168 hours. No results for input(s): AMMONIA in the last 168 hours.  CBC:  Recent Labs Lab 12/05/16 0400 12/06/16 0525 12/07/16 0441 12/08/16 0334 12/09/16 0342 12/10/16 0445  WBC 6.9 6.6 6.5 7.9 8.3 8.1  NEUTROABS 4.6  --   --   --   --   --   HGB 8.5* 7.9* 7.8* 8.7* 8.7* 8.7*  HCT 26.1* 24.2* 23.6* 26.1*  26.1* 26.7* 26.5*  MCV 95.6 95.3 95.5 94.6 95.4 95.7  PLT 98* 99* 108* 118* 126* 132*    Cardiac Enzymes: No results for input(s): CKTOTAL, CKMB, CKMBINDEX, TROPONINI in the last 168 hours.  BNP: BNP (last 3 results)  Recent Labs  11/28/16 1104  BNP 1,825.3*     ProBNP (last 3 results)  Recent Labs  11/27/16 1207  PROBNP 1,746.0*      Other results:  Imaging: No results found.   Medications:     Scheduled Medications: . amiodarone  200 mg Oral BID  . arformoterol  15 mcg Nebulization BID  . atorvastatin  40 mg Oral Daily  . budesonide (PULMICORT) nebulizer solution  0.5 mg Nebulization BID  . busPIRone  5 mg Oral BID  . digoxin  0.0625 mg Oral Daily  . feeding supplement (GLUCERNA SHAKE)  237 mL Oral BID BM  . fluticasone  1 spray Each Nare Daily  . furosemide  80 mg Intravenous BID  . insulin aspart  0-15 Units Subcutaneous TID WC  . insulin aspart  0-5 Units Subcutaneous QHS  . insulin aspart  4 Units Subcutaneous TID WC  . insulin glargine  10 Units Subcutaneous QHS  . levothyroxine  25 mcg Oral QAC breakfast  . metolazone  2.5 mg Oral Once  . multivitamin with minerals  1 tablet Oral Daily  . potassium chloride  40 mEq Oral BID  . potassium chloride  40 mEq Oral Once  . umeclidinium bromide  1 puff Inhalation Daily    Infusions: . heparin 1,050 Units/hr (12/09/16 2257)  . milrinone 0.375 mcg/kg/min (12/10/16 0450)  . norepinephrine (LEVOPHED) Adult infusion 2 mcg/min (12/09/16 1900)    PRN Medications: acetaminophen, albuterol, nitroGLYCERIN, ondansetron (ZOFRAN) IV, sodium chloride flush, traMADol, traZODone   Assessment/Plan/Discussion    1. Acute on chronic systolic CHF: Ischemic cardiomyopathy but with prominent RV dysfunction on echo and exam.  Medtronic ICD.  Echo this admission shows EF 25-30% with moderately dilated LV and moderately dilated/moderately dysfunction RV with moderate to severe TR.  He was admitted with low output biventricular failure, cardiorenal syndrome, and marked volume overload. He is improved with dual inotrope support.  He did not appear to have RV failure markedly out of proportion to LV on RHC, CVP/PCWP was 0.56 by RHC. Upon weaning down inotropic support and cutting back to po  diuretics, CVP rose and co-ox is dropped to 44% => 55% with increase in milrinone to 0.25 => 68% with increase in  milrinone to 0.375.  Norepinephrine added back on 2/4, creatinine down to 1.9 today with CVP 14 and co-ox 59%.  Weight down another pound. - Continue IV Lasix today, will give a dose of metolazone give slower diuresis yesterday.      - Continue milrinone 0.375 + norepinephrine to 3.  Creatinine better today.  If creatinine continues to improve and co-ox remains stable, no IABP.  If creatinine rises/co-ox worse, IABP tomorrow to optimize for surgery.  - Continue digoxin 0.0625 daily. Level ok.  - Hold Entresto, spironolactone, and hydralazine/Imdur with soft BP. - No BB with low output.  - LVAD work up ongoing. Has seen surgeon, HFSW, and VAD coordinator.  He failed attempt to wean down on support, tentative plan for LVAD Friday with TV repair.   2. CAD: s/p CABG.  No signs/sx of ischemia. Continue statin.  Does not need ASA with stable CAD on anticoagulation.  3. AKI on CKD stage III: Suspect cardiorenal syndrome in setting of low output failure/RV failure.  Renal function stabilized with inotropes, but creatinine increased with attempt to wean inotropes.  Back on combination of milrinone + norepinephrine, creatinine is lower today at 1.9. 4. Atrial fibrillation: Chronic.  Rate is not elevated. - Currently stable on heparin gtt/off Eliquis.  5. Aortic stenosis s/p TAVR: Stable on echo.  6. NSVT: On amiodarone while we have him on inotropes. Keep K>4.0, Mg > 2.0.   7. Anemia: No overt bleeding.  Has had IV Fe this admission. FOBT negative.  1 unit PRBCs on 2/2 with appropriate bump.  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  8. Hyponatremia: Na increased with tolvaptan.  No more tolvaptan, will continue fluid restriction.   Loralie Champagne, MD  12/10/2016, 7:29 AM  Advanced Heart Failure Team Pager [phone] (M-F; 7a - 4p)  Please contact [facility] for night-coverage after hours (4p -7a )  and weekends on [email or web address]

## 2016-12-10 NOTE — Progress Notes (Signed)
LVAD Coordinator Note: Pre-Implant  Met with patient and two sisters. Expectations after surgery has been discussed and all questions have been answered. Discussed that we will provide updates every few hours via phone or in person. Discussed he will be sedated on breathing machine at least until the AM after surgery on Saturday and advised his family to go home and rest since he will have 1:1 nursing care for the immediate post-op period. Reviewed VAD equipment including controller, batteries, MPU, driveline care, lifestyle modifications, and coumadin. Will continue to follow.   Balinda Quails RN [place] Coordinator   Office: [phone] 24/7 VAD Pager: [phone]

## 2016-12-10 NOTE — Progress Notes (Signed)
ANTICOAGULATION CONSULT NOTE - Follow Up Consult  Pharmacy Consult for Heparin Indication: atrial fibrillation  No Known Allergies  Patient Measurements: Height: 5\' 5"  (165.1 cm) Weight: 192 lb 1.6 oz (87.1 kg) IBW/kg (Calculated) : 61.5  Vital Signs: Temp: 98.1 F (36.7 C) (02/06 0859) Temp Source: Oral (02/06 0859) BP: 107/58 (02/06 0859) Pulse Rate: 82 (02/06 0859)  Labs:  Recent Labs  12/08/16 0334  12/09/16 0342 12/09/16 1216 12/09/16 2130 12/10/16 0445 12/10/16 0910  HGB 8.7*  --  8.7*  --   --  8.7*  --   HCT 26.1*  26.1*  --  26.7*  --   --  26.5*  --   PLT 118*  --  126*  --   --  132*  --   APTT 59*  < > 59* 136* 69* 62*  --   LABPROT 15.7*  --  15.3*  --   --  16.1*  --   INR 1.24  --  1.20  --   --  1.28  --   HEPARINUNFRC 1.24*  --  0.84* 0.90* 0.73* 0.70  --   CREATININE 2.28*  --  2.07*  --   --  1.91* 2.04*  < > = values in this interval not displayed.  Estimated Creatinine Clearance: 33.7 mL/min (by C-G formula based on SCr of 2.04 mg/dL (H)).   Medications:  Heparin @ 1050 units/hr  Assessment: 70yom on apixaban pta for afib (last dose 1/31 @ 2110), transitioned to IV heparin anticipating LVAD placement (tentatively scheduled for 2/9).   Heparin level is therapeutic at 0.70. Adjusting based on heparin levels now has apixaban should be cleared. Hgb low but stable, platelets ok. No bleeding.  Goal of Therapy:  Heparin level 0.3-0.7 Monitor platelets by anticoagulation protocol: Yes   Plan:  1) Continue heparin at 1050 units/hr 2) Daily heparin level and CBC  Deboraha Sprang 12/10/2016,10:15 AM

## 2016-12-10 NOTE — Care Management Note (Signed)
Case Management Note  Patient Details  Name: Trevor [name] MRN: [id number] Date of Birth: Jan 24, 1946  Subjective/Objective:     Adm w cardio renal syndrome, chf               Action/Plan: lives w fam pta, eval for lvad and poss placemnt on 2-9   Expected Discharge Date:                  Expected Discharge Plan:  [place]  In-House Referral:     Discharge planning Services  CM Consult  Post Acute Care Choice:    Choice offered to:     DME Arranged:    DME Agency:     HH Arranged:    HH Agency:     Status of Service:  In process, will continue to follow  If discussed at Long Length of Stay Meetings, dates discussed:    Additional Comments:cont to follow for dc needs as pt progresses.  Lacretia Leigh, RN 12/10/2016, 9:26 AM

## 2016-12-10 NOTE — Progress Notes (Signed)
Initial Nutrition Assessment  DOCUMENTATION CODES:   Obesity unspecified  INTERVENTION:   -Continue Glucerna Shake po BID, each supplement provides 220 kcal and 10 grams of protein  NUTRITION DIAGNOSIS:   Increased nutrient needs related to chronic illness as evidenced by estimated needs.  Progressing  GOAL:   Patient will meet greater than or equal to 90% of their needs  Progressing  MONITOR:   PO intake, Supplement acceptance, Labs, Weight trends, Skin, I & O's  REASON FOR ASSESSMENT:   Consult  (LVAD evaluation)  ASSESSMENT:   71 y.o. male with medical history significant of CHF, CKD, DM2, asthma. Patient presents to the ED with c/o worsening leg edema and SOB. Admitted with marked volume overload and low output HF. LVAD workup ongoing.  Pt receiving nursing care at time of visit.   Per chart review, pt's appetite continues to be good. Meal completion PO: 75-100%. Per MAR, pt is also accepting Glucerna supplements.   Pt is expected to potentially undergo LVAD on 12/13/16; RD will continue supplements to optimize nutritional status pre-operatively.   Labs reviewed: Na: 131, CBGS: 177-264.   Diet Order:  Diet 2 gram sodium Room service appropriate? Yes; Fluid consistency: Thin; Fluid restriction: 1500 mL Fluid  Skin:  Reviewed, no issues  Last BM:  12/08/16  Height:   Ht Readings from Last 1 Encounters:  11/28/16 5\' 5"  (1.651 m)    Weight:   Wt Readings from Last 1 Encounters:  12/10/16 192 lb 1.6 oz (87.1 kg)    Ideal Body Weight:  61.8 kg  BMI:  Body mass index is 31.97 kg/m.  Estimated Nutritional Needs:   Kcal:  1900-2100  Protein:  105-115 grams  Fluid:  Per MD  EDUCATION NEEDS:   No education needs identified at this time  Tameisha Covell A. Jimmye Norman, RD, LDN, CDE Pager: [phone] After hours Pager: [phone]

## 2016-12-10 NOTE — Progress Notes (Signed)
  Echocardiogram 2D Echocardiogram limited with Definity has been performed.  [name] 12/10/2016, 3:04 PM

## 2016-12-11 ENCOUNTER — Inpatient Hospital Stay (HOSPITAL_COMMUNITY): Payer: Medicare Other

## 2016-12-11 ENCOUNTER — Encounter (HOSPITAL_COMMUNITY)
Admission: EM | Disposition: A | Discharge status: Home Health | Payer: Self-pay | Source: Home / Self Care | Attending: Cardiology

## 2016-12-11 ENCOUNTER — Encounter (HOSPITAL_COMMUNITY): Payer: Self-pay | Admitting: Cardiology

## 2016-12-11 DIAGNOSIS — Z7189 Other specified counseling: Secondary | ICD-10-CM

## 2016-12-11 DIAGNOSIS — Z515 Encounter for palliative care: Secondary | ICD-10-CM

## 2016-12-11 DIAGNOSIS — R570 Cardiogenic shock: Secondary | ICD-10-CM

## 2016-12-11 HISTORY — PX: RIGHT HEART CATH: CATH118263

## 2016-12-11 HISTORY — PX: IABP INSERTION: CATH118242

## 2016-12-11 LAB — POCT I-STAT 3, VENOUS BLOOD GAS (G3P V)
Acid-Base Excess: 4 mmol/L — ABNORMAL HIGH (ref 0.0–2.0)
Acid-Base Excess: 6 mmol/L — ABNORMAL HIGH (ref 0.0–2.0)
Bicarbonate: 29.9 mmol/L — ABNORMAL HIGH (ref 20.0–28.0)
Bicarbonate: 31.4 mmol/L — ABNORMAL HIGH (ref 20.0–28.0)
O2 SAT: 65 %
O2 Saturation: 64 %
PCO2 VEN: 47.8 mmHg (ref 44.0–60.0)
PCO2 VEN: 49.6 mmHg (ref 44.0–60.0)
PH VEN: 7.404 (ref 7.250–7.430)
PO2 VEN: 34 mmHg (ref 32.0–45.0)
TCO2: 33 mmol/L (ref 0–100)
TCO2: 31 mmol/L (ref 0–100)
pH, Ven: 7.409 (ref 7.250–7.430)
pO2, Ven: 34 mmHg (ref 32.0–45.0)

## 2016-12-11 LAB — CBC
HEMATOCRIT: 26.7 % — AB (ref 39.0–52.0)
HEMOGLOBIN: 8.7 g/dL — AB (ref 13.0–17.0)
MCH: 31.2 pg (ref 26.0–34.0)
MCHC: 32.6 g/dL (ref 30.0–36.0)
MCV: 95.7 fL (ref 78.0–100.0)
Platelets: 139 10*3/uL — ABNORMAL LOW (ref 150–400)
RBC: 2.79 MIL/uL — ABNORMAL LOW (ref 4.22–5.81)
RDW: 19.1 % — ABNORMAL HIGH (ref 11.5–15.5)
WBC: 8.1 10*3/uL (ref 4.0–10.5)

## 2016-12-11 LAB — BASIC METABOLIC PANEL
ANION GAP: 12 (ref 5–15)
BUN: 44 mg/dL — ABNORMAL HIGH (ref 6–20)
CO2: 31 mmol/L (ref 22–32)
Calcium: 9.5 mg/dL (ref 8.9–10.3)
Chloride: 89 mmol/L — ABNORMAL LOW (ref 101–111)
Creatinine, Ser: 2.08 mg/dL — ABNORMAL HIGH (ref 0.61–1.24)
GFR calc non Af Amer: 30 mL/min — ABNORMAL LOW (ref >60)
GFR, EST AFRICAN AMERICAN: 35 mL/min — AB (ref >60)
Glucose, Bld: 162 mg/dL — ABNORMAL HIGH (ref 65–99)
POTASSIUM: 4.5 mmol/L (ref 3.5–5.1)
SODIUM: 132 mmol/L — AB (ref 135–145)

## 2016-12-11 LAB — GLUCOSE, CAPILLARY
GLUCOSE-CAPILLARY: 158 mg/dL — AB (ref 65–99)
GLUCOSE-CAPILLARY: 158 mg/dL — AB (ref 65–99)
GLUCOSE-CAPILLARY: 79 mg/dL (ref 65–99)
Glucose-Capillary: 175 mg/dL — ABNORMAL HIGH (ref 65–99)

## 2016-12-11 LAB — COOXEMETRY PANEL
Carboxyhemoglobin: 2.1 % — ABNORMAL HIGH (ref 0.5–1.5)
Methemoglobin: 0.6 % (ref 0.0–1.5)
O2 Saturation: 60.6 %
TOTAL HEMOGLOBIN: 11.2 g/dL — AB (ref 12.0–16.0)

## 2016-12-11 LAB — HEPARIN LEVEL (UNFRACTIONATED)
HEPARIN UNFRACTIONATED: 0.47 [IU]/mL (ref 0.30–0.70)
Heparin Unfractionated: 0.45 IU/mL (ref 0.30–0.70)

## 2016-12-11 LAB — MAGNESIUM: MAGNESIUM: 1.8 mg/dL (ref 1.7–2.4)

## 2016-12-11 SURGERY — IABP INSERTION

## 2016-12-11 MED ORDER — SODIUM CHLORIDE 0.9% FLUSH: 3.0000 mL | INTRAVENOUS | Status: DC | PRN

## 2016-12-11 MED ORDER — FENTANYL CITRATE (PF) 100 MCG/2ML IJ SOLN
INTRAMUSCULAR | Status: DC | PRN
  Administered 2016-12-11 (×3): 25 ug via INTRAVENOUS

## 2016-12-11 MED ORDER — ALUM & MAG HYDROXIDE-SIMETH 200-200-20 MG/5ML PO SUSP
30.0000 mL | Freq: Four times a day (QID) | ORAL | Status: DC | PRN
  Administered 2016-12-11 – 2016-12-12 (×4): 30 mL via ORAL
  Filled 2016-12-11 (×4): qty 30

## 2016-12-11 MED ORDER — HEPARIN (PORCINE) IN NACL 100-0.45 UNIT/ML-% IJ SOLN
1050.0000 [IU]/h | INTRAMUSCULAR | Status: DC
  Administered 2016-12-11 – 2016-12-12 (×2): 1050 [IU]/h via INTRAVENOUS
  Filled 2016-12-11: qty 250

## 2016-12-11 MED ORDER — ONDANSETRON HCL 4 MG/2ML IJ SOLN
4.0000 mg | Freq: Four times a day (QID) | INTRAMUSCULAR | Status: DC | PRN
  Administered 2016-12-12 – 2016-12-17 (×2): 4 mg via INTRAVENOUS
  Filled 2016-12-11 (×2): qty 2

## 2016-12-11 MED ORDER — SODIUM CHLORIDE 0.9% FLUSH: 3.0000 mL | Freq: Two times a day (BID) | INTRAVENOUS | Status: DC

## 2016-12-11 MED ORDER — SODIUM CHLORIDE 0.9 % IV SOLN: 250.0000 mL | INTRAVENOUS | Status: DC | PRN

## 2016-12-11 MED ORDER — MAGNESIUM SULFATE 2 GM/50ML IV SOLN
2.0000 g | Freq: Once | INTRAVENOUS | Status: AC
  Administered 2016-12-11: 2 g via INTRAVENOUS
  Filled 2016-12-11: qty 50

## 2016-12-11 MED ORDER — HEPARIN (PORCINE) IN NACL 2-0.9 UNIT/ML-% IJ SOLN
INTRAMUSCULAR | Status: DC | PRN
  Administered 2016-12-11: 1500 mL via INTRA_ARTERIAL

## 2016-12-11 MED ORDER — IOPAMIDOL (ISOVUE-370) INJECTION 76%
INTRAVENOUS | Status: DC | PRN
  Administered 2016-12-11: 10 mL via INTRA_ARTERIAL

## 2016-12-11 MED ORDER — SODIUM CHLORIDE 0.9% FLUSH
3.0000 mL | Freq: Two times a day (BID) | INTRAVENOUS | Status: DC
  Administered 2016-12-11 – 2016-12-13 (×4): 3 mL via INTRAVENOUS

## 2016-12-11 MED ORDER — MIDAZOLAM HCL 2 MG/2ML IJ SOLN
INTRAMUSCULAR | Status: DC | PRN
  Administered 2016-12-11 (×2): 1 mg via INTRAVENOUS

## 2016-12-11 MED ORDER — HEPARIN (PORCINE) IN NACL 2-0.9 UNIT/ML-% IJ SOLN
INTRAMUSCULAR | Status: AC
  Filled 2016-12-11: qty 500

## 2016-12-11 MED ORDER — SODIUM CHLORIDE 0.9 % IV SOLN
INTRAVENOUS | Status: DC | PRN
  Administered 2016-12-11 (×2): 10 mL/h via INTRAVENOUS

## 2016-12-11 MED ORDER — LIDOCAINE HCL (PF) 1 % IJ SOLN
INTRAMUSCULAR | Status: AC
  Filled 2016-12-11: qty 30

## 2016-12-11 MED ORDER — IOPAMIDOL (ISOVUE-370) INJECTION 76%
INTRAVENOUS | Status: AC
Start: 2016-12-11 — End: 2016-12-11
  Filled 2016-12-11: qty 50

## 2016-12-11 MED ORDER — HEPARIN (PORCINE) IN NACL 2-0.9 UNIT/ML-% IJ SOLN
INTRAMUSCULAR | Status: AC
  Filled 2016-12-11: qty 1000

## 2016-12-11 MED ORDER — GUAIFENESIN-DM 100-10 MG/5ML PO SYRP
10.0000 mL | ORAL_SOLUTION | ORAL | Status: DC | PRN
  Administered 2016-12-11 – 2016-12-12 (×3): 10 mL via ORAL
  Filled 2016-12-11 (×3): qty 10

## 2016-12-11 MED ORDER — ASPIRIN 81 MG PO CHEW
81.0000 mg | CHEWABLE_TABLET | ORAL | Status: AC
  Administered 2016-12-11: 81 mg via ORAL
  Filled 2016-12-11: qty 1

## 2016-12-11 MED ORDER — CHLORHEXIDINE GLUCONATE CLOTH 2 % EX PADS
6.0000 | MEDICATED_PAD | Freq: Every day | CUTANEOUS | Status: DC
  Administered 2016-12-11 – 2016-12-12 (×2): 6 via TOPICAL

## 2016-12-11 MED ORDER — FENTANYL CITRATE (PF) 100 MCG/2ML IJ SOLN
INTRAMUSCULAR | Status: AC
  Filled 2016-12-11: qty 2

## 2016-12-11 MED ORDER — ACETAMINOPHEN 325 MG PO TABS
650.0000 mg | ORAL_TABLET | ORAL | Status: DC | PRN
  Administered 2016-12-14 – 2016-12-26 (×2): 650 mg via ORAL
  Filled 2016-12-11 (×2): qty 2

## 2016-12-11 MED ORDER — MIDAZOLAM HCL 2 MG/2ML IJ SOLN
INTRAMUSCULAR | Status: AC
  Filled 2016-12-11: qty 2

## 2016-12-11 MED ORDER — LIDOCAINE HCL (PF) 1 % IJ SOLN
INTRAMUSCULAR | Status: DC | PRN
  Administered 2016-12-11 (×2): 15 mL

## 2016-12-11 SURGICAL SUPPLY — 14 items
BALLN LINEAR 7.5FR IABP 40CC (BALLOONS) ×3
BALLOON LINEAR 7.5FR IABP 40CC (BALLOONS) ×1 IMPLANT
CATH INFINITI 5FR ANG PIGTAIL (CATHETERS) ×3 IMPLANT
CATH SWAN GANZ VIP 7.5F (CATHETERS) ×3 IMPLANT
DEVICE SECURE STATLOCK IABP (MISCELLANEOUS) ×6 IMPLANT
ELECT DEFIB PAD ADLT CADENCE (PAD) ×3 IMPLANT
KIT HEART RIGHT NAMIC (KITS) ×3 IMPLANT
PACK CARDIAC CATHETERIZATION (CUSTOM PROCEDURE TRAY) ×3 IMPLANT
PROTECTION STATION PRESSURIZED (MISCELLANEOUS) ×3
SHEATH PINNACLE 7F 10CM (SHEATH) ×3 IMPLANT
SHEATH PINNACLE 8F 10CM (SHEATH) ×3 IMPLANT
SLEEVE REPOSITIONING LENGTH 30 (MISCELLANEOUS) ×3 IMPLANT
STATION PROTECTION PRESSURIZED (MISCELLANEOUS) ×1 IMPLANT
WIRE EMERALD 3MM-J .035X150CM (WIRE) ×3 IMPLANT

## 2016-12-11 NOTE — Progress Notes (Signed)
ANTICOAGULATION CONSULT NOTE - Follow Up Consult  Pharmacy Consult for Heparin Indication: atrial fibrillation  No Known Allergies  Patient Measurements: Height: 5\' 5"  (165.1 cm) Weight: 189 lb 14.4 oz (86.1 kg) IBW/kg (Calculated) : 61.5  Vital Signs: Temp: 98.2 F (36.8 C) (02/07 2200) Temp Source: Oral (02/07 1959) BP: 90/66 (02/07 2200) Pulse Rate: 51 (02/07 2200)  Labs:  Recent Labs  12/09/16 0342 12/09/16 1216 12/09/16 2130 12/10/16 0445 12/10/16 0910 12/11/16 0305 12/11/16 2112  HGB 8.7*  --   --  8.7*  --  8.7*  --   HCT 26.7*  --   --  26.5*  --  26.7*  --   PLT 126*  --   --  132*  --  139*  --   APTT 59* 136* 69* 62*  --   --   --   LABPROT 15.3*  --   --  16.1*  --   --   --   INR 1.20  --   --  1.28  --   --   --   HEPARINUNFRC 0.84* 0.90* 0.73* 0.70  --  0.47 0.45  CREATININE 2.07*  --   --  1.91* 2.04* 2.08*  --     Estimated Creatinine Clearance: 32.9 mL/min (by C-G formula based on SCr of 2.08 mg/dL (H)).   Medications:  Heparin @ 1050 units/hr  Assessment: 70yom on apixaban pta for afib (last dose 1/31 @ 2110), transitioned to IV heparin anticipating LVAD placement (tentatively scheduled for 2/9).   Heparin level is therapeutic at 0.45 on heparin 1050 units/hr. No bleeding noted. This AM, Hgb low but stable, platelets ok.  IABP placed tonight ~20:00 to support him until LVAD placement.   Goal of Therapy:  Heparin level  lower goal 0.2-0.5 once IABP in place (placed 2/7 @ 20:00)  Monitor platelets by anticoagulation protocol: Yes   Plan:  1) Continue heparin at 1050 units/hr 2) Daily heparin level, CBC 3) aim for lower heparin level  goal 0.2-0.5 now that IABP is in place  Nicole Cella, [facility] Clinical Pharmacist Pager: [phone] 12/11/2016,10:18 PM     ADDENDUM: Patient is now s/p IABP placement. Will start heparin at previously therapeutic rate 1050 units/hr and check 8 hour heparin level. Aim for lower goal 0.2-0.5.  Deboraha Sprang 12/11/2016, 11:50 AM

## 2016-12-11 NOTE — Progress Notes (Addendum)
Advanced Heart Failure Rounding Note   Subjective:    Admitted with marked volume overload and low output HF.  BP soft so he was started on milrinone 0.25 mcg + norepi 3 mcg.   Coox 44.4% 1/31 OFF norepi and on milrinone 0.125 mcg/kg/min. Milrinone increased to 0.375 and norepinephrine 3 added back, co-ox 60%.  CVP 13-14 today.  Weight down 2 lbs.   Creatinine 1.74 -> 1.99 -> 2.2 -> 1.79 -> 2.26 -> 2.28 -> 2.07 -> 1.9 -> 2.08.    He got 1 unit PRBCs on 2/3.  FOBT negative.   No complaints, doing some walking.     Echo: EF 20-25%, moderate LV dilation, bioprosthetic aortic valve ok, moderate MR, RV moderately dilated with moderate dysfunction, severe TR.   Renal u/s with cortical thinning R 12.0 cm L 12.2 cm  RHC Procedural Findings (on milrinone 0.25 + norepinephrine 2): Hemodynamics (mmHg) RA mean 9 RV 52/11 PA 52/16, mean 31 PCWP mean 16 CVP/PCWP = 0.56 Oxygen saturations: PA 63% AO 95% Cardiac Output (Fick) 6.93  Cardiac Index (Fick) 3.55 PVR 2.1 WU Cardiac Output (Thermo) 6.16 Cardiac Index (Thermo) 3.16  PVR 2.4 WU  Objective:   Weight Range:  Vital Signs:   Temp:  [97.8 F (36.6 C)-98.7 F (37.1 C)] 97.8 F (36.6 C) (02/07 0750) Pulse Rate:  [80-91] 91 (02/07 0750) Resp:  [12-16] 14 (02/07 0750) BP: (107-124)/(58-82) 119/74 (02/07 0750) SpO2:  [96 %-98 %] 98 % (02/06 1905) Weight:  [189 lb 14.4 oz (86.1 kg)] 189 lb 14.4 oz (86.1 kg) (02/07 0423) Last BM Date: 12/08/16  Weight change: Filed Weights   12/09/16 0325 12/10/16 0500 12/11/16 0423  Weight: 193 lb 9.6 oz (87.8 kg) 192 lb 1.6 oz (87.1 kg) 189 lb 14.4 oz (86.1 kg)    Intake/Output:   Intake/Output Summary (Last 24 hours) at 12/11/16 0753 Last data filed at 12/11/16 0600  Gross per 24 hour  Intake          1606.41 ml  Output             2300 ml  Net          -693.59 ml     Physical Exam: CVP 13-14 General:  Elderly appearing. resp difficulty. Sitting in the chair  HEENT: normal.    Neck: supple. JVP 10-12. Carotids 2+ bilat; no bruits. No thyromegaly or nodule noted.  Cor: PMI nondisplaced. Irregular rate & rhythm. 2/6 HSM LLSB, 2/6 SEM RUSB.  Lungs: Diminished basilar sounds, no crackles.  Abdomen: obese soft, NT, ND, no HSM. No bruits or masses. +BS  Extremities: no cyanosis, clubbing, rash.  1+ edema 3/4 to knees bilaterally.    Neuro: alert & orientedx3, cranial nerves grossly intact. moves all 4 extremities w/o difficulty. Affect pleasant  Telemetry: Reviewed,  A fib 70s-90s  Labs: Basic Metabolic Panel:  Recent Labs Lab 12/04/16 0946  12/08/16 0334 12/09/16 0342 12/10/16 0445 12/10/16 0910 12/11/16 0305  NA  --   < > 131* 131* 131* 133* 132*  K  --   < > 4.0 4.0 4.2 4.2 4.5  CL  --   < > 90* 89* 89* 90* 89*  CO2  --   < > 31 32 32 31 31  GLUCOSE  --   < > 243* 197* 191* 188* 162*  BUN  --   < > 49* 48* 47* 46* 44*  CREATININE  --   < > 2.28* 2.07* 1.91* 2.04* 2.08*  CALCIUM  --   < > 9.2 9.3 9.3 9.6 9.5  MG 1.8  --   --   --   --   --  1.8  < > = values in this interval not displayed.  Liver Function Tests: No results for input(s): AST, ALT, ALKPHOS, BILITOT, PROT, ALBUMIN in the last 168 hours. No results for input(s): LIPASE, AMYLASE in the last 168 hours. No results for input(s): AMMONIA in the last 168 hours.  CBC:  Recent Labs Lab 12/05/16 0400  12/07/16 0441 12/08/16 0334 12/09/16 0342 12/10/16 0445 12/11/16 0305  WBC 6.9  < > 6.5 7.9 8.3 8.1 8.1  NEUTROABS 4.6  --   --   --   --   --   --   HGB 8.5*  < > 7.8* 8.7* 8.7* 8.7* 8.7*  HCT 26.1*  < > 23.6* 26.1*  26.1* 26.7* 26.5* 26.7*  MCV 95.6  < > 95.5 94.6 95.4 95.7 95.7  PLT 98*  < > 108* 118* 126* 132* 139*  < > = values in this interval not displayed.  Cardiac Enzymes: No results for input(s): CKTOTAL, CKMB, CKMBINDEX, TROPONINI in the last 168 hours.  BNP: BNP (last 3 results)  Recent Labs  11/28/16 1104  BNP 1,825.3*    ProBNP (last 3 results)  Recent  Labs  11/27/16 1207  PROBNP 1,746.0*      Other results:  Imaging: No results found.   Medications:     Scheduled Medications: . amiodarone  200 mg Oral BID  . arformoterol  15 mcg Nebulization BID  . atorvastatin  40 mg Oral Daily  . budesonide (PULMICORT) nebulizer solution  0.5 mg Nebulization BID  . busPIRone  5 mg Oral BID  . digoxin  0.0625 mg Oral Daily  . feeding supplement (GLUCERNA SHAKE)  237 mL Oral BID BM  . fluticasone  1 spray Each Nare Daily  . furosemide  80 mg Intravenous BID  . insulin aspart  0-15 Units Subcutaneous TID WC  . insulin aspart  0-5 Units Subcutaneous QHS  . insulin aspart  4 Units Subcutaneous TID WC  . insulin glargine  10 Units Subcutaneous QHS  . levothyroxine  25 mcg Oral QAC breakfast  . multivitamin with minerals  1 tablet Oral Daily  . potassium chloride  40 mEq Oral BID  . umeclidinium bromide  1 puff Inhalation Daily    Infusions: . heparin 1,050 Units/hr (12/10/16 0905)  . milrinone 0.375 mcg/kg/min (12/10/16 2328)  . norepinephrine (LEVOPHED) Adult infusion 3 mcg/min (12/10/16 1123)    PRN Medications: acetaminophen, albuterol, nitroGLYCERIN, ondansetron (ZOFRAN) IV, sodium chloride flush, traMADol, traZODone   Assessment/Plan/Discussion    1. Acute on chronic systolic CHF: Ischemic cardiomyopathy but with prominent RV dysfunction on echo and exam.  Medtronic ICD.  Echo this admission shows EF 20-25% with moderately dilated LV and moderately dilated/moderately dysfunction RV with severe TR.  He was admitted with low output biventricular failure, cardiorenal syndrome, and marked volume overload. He is improved with dual inotrope support.  He did not appear to have RV failure markedly out of proportion to LV on RHC, CVP/PCWP was 0.56 by RHC. Upon weaning down inotropic support and cutting back to po diuretics, CVP rose and co-ox is dropped to 44% => 55% with increase in milrinone to 0.25 => 68% with increase in milrinone to  0.375.  Norepinephrine added back on 2/4, creatinine at 2.08 today with CVP 13-14 and co-ox 60%.  Weight down 3 lbs. -  Continue IV Lasix today.      - Continue milrinone 0.375 + norepinephrine to 3.  Creatinine still > 2.  I think he will need IABP for optimization prior to LVAD placement.  Will arrange for IABP this morning, discussed risks/benefits with patient and he agrees to proceed.  - Continue digoxin 0.0625 daily. Level ok.  - Hold Entresto, spironolactone, and hydralazine/Imdur with soft BP. - No BB with low output.  - LVAD work up ongoing. Has seen surgeon, HFSW, and VAD coordinator.  He failed attempt to wean down on support, tentative plan for LVAD Friday with TV repair.  Will discuss with transplant center.  2. CAD: s/p CABG.  No signs/sx of ischemia. Continue statin.  Does not need ASA with stable CAD on anticoagulation.  3. AKI on CKD stage III: Suspect cardiorenal syndrome in setting of low output failure/RV failure.  Renal function stabilized with inotropes, but creatinine increased with attempt to wean inotropes.  Back on combination of milrinone + norepinephrine, creatinine 2.08 today.  As above, plan for IABP today. 4. Atrial fibrillation: Chronic.  Rate is not elevated. - Currently stable on heparin gtt/off Eliquis.  5. Aortic stenosis s/p TAVR: Stable on echo.  6. NSVT: On amiodarone while we have him on inotropes. Keep K>4.0, Mg > 2.0.   7. Anemia: No overt bleeding.  Has had IV Fe this admission. FOBT negative.  1 unit PRBCs on 2/2 with appropriate bump.  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  8. Hyponatremia: Na increased with tolvaptan.  No more tolvaptan, will continue fluid restriction.   Loralie Champagne, MD  12/11/2016, 7:53 AM  Advanced Heart Failure Team Pager [phone] (M-F; 7a - 4p)  Please contact [facility] for night-coverage after hours (4p -7a ) and weekends on [email or web address]

## 2016-12-11 NOTE — Progress Notes (Signed)
Orthopedic Tech Progress Note Patient Details:  Trevor [name] 1946/10/15 [id number]  Ortho Devices Type of Ortho Device: Knee Immobilizer Ortho Device/Splint Location: rle Ortho Device/Splint Interventions: Application   Raad Clayson 12/11/2016, 3:30 PM

## 2016-12-11 NOTE — Progress Notes (Addendum)
ANTICOAGULATION CONSULT NOTE - Follow Up Consult  Pharmacy Consult for Heparin Indication: atrial fibrillation  No Known Allergies  Patient Measurements: Height: 5\' 5"  (165.1 cm) Weight: 189 lb 14.4 oz (86.1 kg) IBW/kg (Calculated) : 61.5  Vital Signs: Temp: 97.8 F (36.6 C) (02/07 0750) Temp Source: Oral (02/07 0750) BP: 119/74 (02/07 0750) Pulse Rate: 91 (02/07 0750)  Labs:  Recent Labs  12/09/16 0342 12/09/16 1216 12/09/16 2130 12/10/16 0445 12/10/16 0910 12/11/16 0305  HGB 8.7*  --   --  8.7*  --  8.7*  HCT 26.7*  --   --  26.5*  --  26.7*  PLT 126*  --   --  132*  --  139*  APTT 59* 136* 69* 62*  --   --   LABPROT 15.3*  --   --  16.1*  --   --   INR 1.20  --   --  1.28  --   --   HEPARINUNFRC 0.84* 0.90* 0.73* 0.70  --  0.47  CREATININE 2.07*  --   --  1.91* 2.04* 2.08*    Estimated Creatinine Clearance: 32.9 mL/min (by C-G formula based on SCr of 2.08 mg/dL (H)).   Medications:  Heparin @ 1050 units/hr  Assessment: 70yom on apixaban pta for afib (last dose 1/31 @ 2110), transitioned to IV heparin anticipating LVAD placement (tentatively scheduled for 2/9).   Heparin level is therapeutic at 0.47. Hgb low but stable, platelets ok. No bleeding. For IABP today to support him until LVAD placement.   Goal of Therapy:  Heparin level 0.3-0.7 Monitor platelets by anticoagulation protocol: Yes   Plan:  1) Continue heparin at 1050 units/hr 2) Follow up after IABP for heparin, will need to aim for lower goal 0.2-0.5 once IABP in place  Deboraha Sprang 12/11/2016,9:09 AM     ADDENDUM: Patient is now s/p IABP placement. Will start heparin at previously therapeutic rate 1050 units/hr and check 8 hour heparin level. Aim for lower goal 0.2-0.5.  Deboraha Sprang 12/11/2016, 11:50 AM

## 2016-12-11 NOTE — Progress Notes (Signed)
LVAD Initial Psychosocial Screening  Date/Time Initiated: 12/10/2016 2:25pm  Referral Source:  Balinda Quails, [place] Coordinator Referral Reason:  LVAD implantation Source of Information:  Pt, sisters and chart review  Demographics Name:  Trevor [name] Address:  [address] [place] [address] Home phone:   [phone]  Cell: [phone] Marital Status: Widowed (2015) Faith:  Barrister's clerk Language:  English SS:    [id number] DOB:  [birth year]  Medical & Follow-up Adherence to Medical regimen/INR checks:  compliant Medication adherence:  compliant Physician/Clinic Appointment Attendance:  compliant   Advance Directives: Do you have a Living Will or Medical POA?  Patient reports he has a Living Will and sister [name] is listed as HPOA. Sister will bring document for scanning into medical record. Would you like to complete a Living Will and Medical POA prior to surgery? n/a  Do you have Goals of Care?  Yes, discussed and in AD Have you had a consult with the Palliative Care Team at [facility]? Yes Psychological Health Appearance:  Patient lying in bed well groomed in hospital gown. Mental Status:  Alert and oriented Eye Contact:  good Thought Content:  Coherent and appropriate Speech:  clear Mood:  Appropriate and stable Affect:  positive Insight:  good Judgement: sound Interaction Style:  good  Family/Social Information Who lives in your home? Name:   Relationship:   [name]   Sister 2 cats and 1 dog Other family members/support persons in your life? Name:   Relationship:   Rondell Reams  Sister (lives in [place]. although staying with sister to support patient through surgery) Dorthey Sawyer  Niece (Chris's daughter) [phone]  Caregiving Needs Who is the primary caregiver? [place] status:  good Do you drive?  yes Do you work?  retired Physical Limitations:  none Do you have other care giving responsibilities?  none Contact number:  [phone] cell  Who is the secondary caregiver? [place] status:  Pending hip surgery Do you drive?  yes Do you work?  Yes full time M-F Physical Limitations:  Pending hip surgery (March, 2018?) Do you have other care giving responsibilities?  none Contact number: [phone]  Home Environment/Personal Care Do you have reliable phone service?  yes If so, what is the number? [phone] (cell preferred)  [phone] (home) Do you own or rent your home? Lives with sister Current mortgage/rent: n/a Number of steps into the home? 1 small step How many levels in the home? 1 level Assistive devices in the home? Walker (not currently needed) Electrical needs for LVAD (3 prong outlets)? yes Second hand smoke exposure in the home? Sister smokes outside Travel distance from [place]? 10-15 minutes Self-care: independent Ambulation: independent  Community Are you active with community agencies/resources/homecare? none Are you active in a church, Glass blower/designer, mosque or other faith based community? Not affiliated What other sources do you have for spiritual support? Father in law Are you active in any clubs or social organizations? none What do you do for fun?  Hobbies?  Interests? Fishing  Education/Work Information What is the last grade of school you completed? Associate degree Preferred method of learning?  Written, Verbal and Hands on Do you have any problems with reading or writing?  no Are you currently employed?  no  When were you last employed? 1 month ago  Name of employer? [employer]  Please describe the kind of work you do? A starter  How long have you worked there? 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  If you are not working, do you plan to return to work after VAD surgery? Would like to if possible If  yes, what type of employment do you hope to find? Return to a golf course Are you interested in job training or learning new skills? no Did you serve in the [employer]?  If so, what branch? No  Financial Information What is your source of income? Social Security Do you have difficulty meeting your monthly expenses? "always when living alone but now able to share expenses with sister" How do you cope with this? Split bills with his sister Can you budget for the monthly cost for dressing supplies post procedure?  Discussed at length and options for assistance if needed.  Primary Health insurance:  Medicare Secondary Insurance: AARP Prescription plan: Medicare D Pharmacy:  [facility]  What are your prescription co-pays? varies Do you use mail order for your prescriptions?  no Have you ever had to refuse medication due to cost?  Yes but asked for generic replacement Have you applied for Medicaid?  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 He had an aortic valve surgery in 2015 Do you have a PCP or other medical provider? No PCP Per Patient Are you able to complete your ADL's? independent Do you have a history of trauma, physical, emotional, or sexual abuse? No Do you have any family history of heart problems? Father and sister Do you smoke now or past usage?   Never smoked Do you drink alcohol now or past usage?  socially   Quit date: 3 years ago  Are you currently using illegal drugs or misuse of medication or past usage?  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 none        Mental Health History How have you been feeling in the past year? Not good Have you ever had any problems with depression, anxiety or other mental health issues? No diagnosis but admits to occasional  depression Do you see a counselor, psychiatrist or therapist?   No If you are currently experiencing problems are you interested in talking with a professional? no Have you or are you taking medications for anxiety/depression or any mental health concerns?  Yes Current Medications: Patient reports not sure the medication as it was just started recently. What are your coping strategies under stressful situations? Eating and talk with father in law and other support. Are there any other stressors in your life? Not really- financial issues Have you had any past or current thoughts of suicide? No How many hours do you sleep at night? 6 max ( CPAP used normally but has not been on machine for past two weeks) How is your appetite? decent Would you be interested in attending the LVAD support group? Yes PHQ2 Depression Scale: 2  Legal Do you currently have any legal issues/problems?   No Have you had any legal issues/problems in the past?  Foreclosure on house years ago    Plan for VAD Implementation Do you know and understand what happens during the VAD surgery? Patient verbalizes understanding of surgery, implant hospitalization and  recovery process. What do you  know about the risks and side effect associated with VAD surgery? Patient understands infection, stroke and death and others discussed with VAD Coordinator and Surgeon. Explain what will happen right after surgery:   OR to SICU What is your plan for transportation for the first 8 weeks post-surgery? (Patients are not recommended to drive post-surgery for 8 weeks)  Driver:    Sisters: [name] and [name] Do you have airbags in your vehicle?  There is a risk of discharging the device if the airbag were to deploy. Discussed air bags and recommendation of sitting in back seat during initial recovery. What do you know about your diet post-surgery?   Heart healthy How do you plan to monitor your medications, current and future? Self pour Pill box    How do you plan to complete ADL's post-surgery?  Family support Will it be difficult to ask for help from your caregivers?  No  Please explain what you hope will be improved about your life as a result of receiving the LVAD? "I will be alive" and getting back to my old self Please tell me your biggest concern or fear about living with the LVAD?  Patient reports " haven't figured that out yet" How do you cope with your concerns and fears?  Talk with supports Please explain your understanding of how their body will change.   Are you worried about these changes? Patient denies any concerns Do you see any barriers to your surgery or follow-up? Patient denies any barriers  Understanding of LVAD Patient states understanding of the following: Surgical procedures and risks, Electrical need for LVAD (3 prong outlets), Safety precautions with LVAD (water, etc.), LVAD daily self-care (dressing changes, computer check, extra supplies), Outpatient follow up (LVAD clinic appts, monitoring blood thinners) and Need for Emergency Planning  Discussed and Reviewed with Patient and Caregiver  Patient's current level of motivation to prepare for LVAD: Motivated to get back health Patient's present Level of Consent for LVAD: Ready    Education provided to patient/family/caregiver:   Caregiver role and responsibiltiy, Financial planning for LVAD, Role of Clinical Social Worker and Signs of Depression and Anxiety   Caregiver questions Please explain what you hope will be improved about your life and loved one's life as a result of receiving the LVAD?  Sister states "hope to reduce his anxiety" What is your biggest concern or fear about caregiving with an LVAD patient?  Sister [name] states "I am almost relieved for him to have the surgery and feel better" What is your plan for availability to provide care 24/7 x2 weeks post op and dressing changes ongoing?  Both sisters will provide care and complete dressing  changes Who is the relief/backup caregiver and what is their availability?  Both sisters reside with him and will take on the repsonsibility Preferred method of learning? Written, Verbal and Hands on  Do you drive? Yes How do you handle stressful situations?  "I'm OK" Do you think you can do this? Yes Is there anything that concerns about caregiving?  none Do you provide caregiving to anyone else?  no   Caregiver's current level of motivation to prepare for LVAD: Motivated for health improvement Caregiver's present level of consent for LVAD:  Ready  Clinical Interventions Needed:  CSW discussed signs and symptoms of depression and anxiety as patient admits to new medication and sister shared concerns of recent increase in anxiety and hopes for reduced anxiety post implant. CSW encouraged attendance with the LVAD Support Group to  gain peer to peer support for both patient and caregivers. CSW will monitor and provide supportive intervention throughout implant hospitalization and ongoing through the outpatient [facility].    Clinical Impressions/Recommendations:   Patient is a 71 yo male who was married for 30+ years although his wife passed away in 03-01-14 from cancer. He lived in [place] for many years and recently moved to [place] to be with his sister due to health issues. 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 He is hopeful to return to work post VAD if able to gain his strength and tolerate working. He reports he has an Scientist, physiological and has listed his sister [name] as HPOA and is compliant with his medical regimen. He currently lives with his sister [name] in a 1 level condo. Patient's other sister [name] is visiting from [place] and will remain throughout recovery process.  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 He enjoys fishing as a hobby and his past career. He hopes to return to work post VAD recovery if possible. He reports current income from [employer] and managing his finances with his sister to cover household bills. Patient denies any history of mental health issues although admits to recent anxiety and was started on a medication. He denies any history of trauma, never used tobacco and was a social drinker although has not had a drink in 3 years. 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 He noted that his father in law is a good support emotionally and he will reach out to others for additional support when needed. He scored a 2 on the [facility] but attributed most to his current medical situation. He denies any legal issues at this time. Patient verbalizes understanding of VAD surgery and risks and appears very knowledgeable about the implant and what is required post surgery. Patient appears to have an excellent support system with his sisters who are very devoted to his care and recovery.  Patient is motivated to get back his health and possibly return to work.  Patient denied any issues with body image at this time.  Patient appears to be a good candidate for LVAD implant and has excellent supports in place.   Melba Coon, [place]

## 2016-12-11 NOTE — Progress Notes (Signed)
This RN, Allena Katz RN, and [facility] RN discussed pt's need for a foley catheter. Pt initially able to use urinal and condom catheter after IABP placement. Pt now c/o urge to void despite having good UOP. Bladder scan performed.  Scan showed >750cc of urine. Orders given by Darrick Grinder NP to place foley catheter. All three RN's at the bedside for placement. After foley placement pt voided ~925cc of urine.

## 2016-12-11 NOTE — Progress Notes (Signed)
Daily Progress Note   Patient Name: Trevor [name]       Date: 12/11/2016 DOB: 22-Jul-1946  Age: 71 y.o. MRN#: [id number] Attending Physician: Larey Dresser, MD Primary Care Physician: No PCP Per Patient Admit Date: 11/27/2016  Reason for Consultation/Follow-up: VAD eval  Subjective:  Lying in bed. IABP placed today.   Length of Stay: 13  Current Medications: Scheduled Meds:  . amiodarone  200 mg Oral BID  . arformoterol  15 mcg Nebulization BID  . atorvastatin  40 mg Oral Daily  . budesonide (PULMICORT) nebulizer solution  0.5 mg Nebulization BID  . busPIRone  5 mg Oral BID  . Chlorhexidine Gluconate Cloth  6 each Topical Daily  . digoxin  0.0625 mg Oral Daily  . feeding supplement (GLUCERNA SHAKE)  237 mL Oral BID BM  . fluticasone  1 spray Each Nare Daily  . furosemide  80 mg Intravenous BID  . insulin aspart  0-15 Units Subcutaneous TID WC  . insulin aspart  0-5 Units Subcutaneous QHS  . insulin aspart  4 Units Subcutaneous TID WC  . insulin glargine  10 Units Subcutaneous QHS  . levothyroxine  25 mcg Oral QAC breakfast  . multivitamin with minerals  1 tablet Oral Daily  . potassium chloride  40 mEq Oral BID  . sodium chloride flush  3 mL Intravenous Q12H  . umeclidinium bromide  1 puff Inhalation Daily    Continuous Infusions: . heparin 1,050 Units/hr (12/11/16 1345)  . milrinone 0.375 mcg/kg/min (12/11/16 0840)  . norepinephrine (LEVOPHED) Adult infusion 3 mcg/min (12/11/16 1126)    PRN Meds: sodium chloride, acetaminophen, albuterol, nitroGLYCERIN, ondansetron (ZOFRAN) IV, sodium chloride flush, sodium chloride flush, traMADol, traZODone  Physical Exam  Constitutional: He is oriented to person, place, and time. He appears well-developed.  HENT:  Head:  Normocephalic and atraumatic.  Cardiovascular: Normal rate.  An irregularly irregular rhythm present.  Pulmonary/Chest: Effort normal and breath sounds normal. No accessory muscle usage. No tachypnea. No respiratory distress.  Abdominal: Soft. Normal appearance.  Neurological: He is alert and oriented to person, place, and time.  Nursing note and vitals reviewed.           Vital Signs: BP (!) 122/98   Pulse 68   Temp 98.4 F (36.9 C)   Resp 20   Ht  _0  (1.651 m)   Wt 86.1 kg (189 lb 14.4 oz)   SpO2 98%   BMI 31.60 kg/m  SpO2: SpO2: 98 % O2 Device: O2 Device: Not Delivered O2 Flow Rate: O2 Flow Rate (L/min): 2 L/min  Intake/output summary:   Intake/Output Summary (Last 24 hours) at 12/11/16 1917 Last data filed at 12/11/16 1900  Gross per 24 hour  Intake          1249.92 ml  Output             3475 ml  Net         -2225.08 ml   LBM: Last BM Date: 12/11/16 Baseline Weight: Weight: 93.7 kg (206 lb 9.1 oz) Most recent weight: Weight: 86.1 kg (189 lb 14.4 oz)       Palliative Assessment/Data:    Flowsheet Rows   Flowsheet Row Most Recent Value  Intake Tab  Referral Department  Cardiology  Unit at Time of Referral  ICU  Palliative Care Primary Diagnosis  Cardiac  Date Notified  12/03/16  Palliative Care Type  New Palliative care  Reason for referral  Clarify Goals of Care, Other (Comment) [VAD Eval]  Date of Admission  11/27/16  # of days IP prior to Palliative referral  6  Clinical Assessment  Psychosocial & Spiritual Assessment  Palliative Care Outcomes      Patient Active Problem List   Diagnosis Date Noted  . Goals of care, counseling/discussion   . Palliative care encounter   . Acute on chronic systolic CHF (congestive heart failure) ([place]) 11/28/2016  . Acute on chronic kidney failure ([place]) 11/28/2016  . Cardiorenal syndrome with renal failure 11/28/2016  . Acute on chronic systolic CHF (congestive heart failure), NYHA class 4 ([place]) 11/28/2016  .  Cardiomyopathy, ischemic 11/27/2016  . Coronary artery disease 11/27/2016  . Hx of CABG 11/27/2016  . Status post aortic valve replacement 11/27/2016  . Asthma, chronic 11/27/2016  . OSA (obstructive sleep apnea) 11/27/2016  . Chronic renal insufficiency 11/27/2016    Palliative Care Assessment & Plan   HPI: 71 y.o. male  with past medical history of CAD s/p CABG 2010, AVR TAVR 8756, systolic CHF (EF 43-32%), CKD, DM2, asthma, and recent admission in [place] for "cardiorenal syndrome" but renal function recovered much more than his medical team anticipated per patient report . He was admitted on 11/27/2016 with chest pain, SOB, and worsening leg swelling. EF 20-25% now and unable to wean milrinone d/t worsening renal function. Now considering VAD.    Assessment: I met again today with Mr. Guyton. No family at bedside. He now has IABP and is now bed bound. Becoming more anxious about surgery and not sleeping well d/t anxiety. He tells me that his sisters say he is extremely anxious and are worried about his anxiety. He appears to be coping well and I believe his anxiety is expecting with the gravity of the surgery, recovery, and life changes that are occurring (he agrees). We discuss relaxation and coping techniques. He is usually up and walking about his room so it doesn't help that he is unable to ambulate at this time but he understands the reasoning in trying to optimize his health and kidneys prior to surgery. Emotional support provided. Encouraged rest.   Recommendations/Plan:  Insomnia: Continue trazodone and will obtain different bed for his comfort.   Anxiety: Restart Buspar 5 mg BID. Will discuss with heart failure team.   Goals of Care and Additional Recommendations:  Limitations on  Scope of Treatment: Full Scope Treatment  Code Status:  Full code  Prognosis:   Unable to determine  Discharge Planning:  To Be Determined   Thank you for allowing the Palliative Medicine  Team to assist in the care of this patient.   Total Time 74mn Prolonged Time Billed  no       Greater than 50%  of this time was spent counseling and coordinating care related to the above assessment and plan.  AVinie Sill NP Palliative Medicine Team Pager # [phone](M-F 8a-5p) Team Phone # [phone](Nights/Weekends)

## 2016-12-11 NOTE — H&P (View-Only) (Signed)
Advanced Heart Failure Rounding Note   Subjective:    Admitted with marked volume overload and low output HF.  BP soft so he was started on milrinone 0.25 mcg + norepi 3 mcg.   Coox 44.4% 1/31 OFF norepi and on milrinone 0.125 mcg/kg/min. Milrinone increased to 0.375 and norepinephrine 3 added back, co-ox 60%.  CVP 13-14 today.  Weight down 2 lbs.   Creatinine 1.74 -> 1.99 -> 2.2 -> 1.79 -> 2.26 -> 2.28 -> 2.07 -> 1.9 -> 2.08.    He got 1 unit PRBCs on 2/3.  FOBT negative.   No complaints, doing some walking.     Echo: EF 20-25%, moderate LV dilation, bioprosthetic aortic valve ok, moderate MR, RV moderately dilated with moderate dysfunction, severe TR.   Renal u/s with cortical thinning R 12.0 cm L 12.2 cm  RHC Procedural Findings (on milrinone 0.25 + norepinephrine 2): Hemodynamics (mmHg) RA mean 9 RV 52/11 PA 52/16, mean 31 PCWP mean 16 CVP/PCWP = 0.56 Oxygen saturations: PA 63% AO 95% Cardiac Output (Fick) 6.93  Cardiac Index (Fick) 3.55 PVR 2.1 WU Cardiac Output (Thermo) 6.16 Cardiac Index (Thermo) 3.16  PVR 2.4 WU  Objective:   Weight Range:  Vital Signs:   Temp:  [97.8 F (36.6 C)-98.7 F (37.1 C)] 97.8 F (36.6 C) (02/07 0750) Pulse Rate:  [80-91] 91 (02/07 0750) Resp:  [12-16] 14 (02/07 0750) BP: (107-124)/(58-82) 119/74 (02/07 0750) SpO2:  [96 %-98 %] 98 % (02/06 1905) Weight:  [189 lb 14.4 oz (86.1 kg)] 189 lb 14.4 oz (86.1 kg) (02/07 0423) Last BM Date: 12/08/16  Weight change: Filed Weights   12/09/16 0325 12/10/16 0500 12/11/16 0423  Weight: 193 lb 9.6 oz (87.8 kg) 192 lb 1.6 oz (87.1 kg) 189 lb 14.4 oz (86.1 kg)    Intake/Output:   Intake/Output Summary (Last 24 hours) at 12/11/16 0753 Last data filed at 12/11/16 0600  Gross per 24 hour  Intake          1606.41 ml  Output             2300 ml  Net          -693.59 ml     Physical Exam: CVP 13-14 General:  Elderly appearing. resp difficulty. Sitting in the chair  HEENT: normal.    Neck: supple. JVP 10-12. Carotids 2+ bilat; no bruits. No thyromegaly or nodule noted.  Cor: PMI nondisplaced. Irregular rate & rhythm. 2/6 HSM LLSB, 2/6 SEM RUSB.  Lungs: Diminished basilar sounds, no crackles.  Abdomen: obese soft, NT, ND, no HSM. No bruits or masses. +BS  Extremities: no cyanosis, clubbing, rash.  1+ edema 3/4 to knees bilaterally.    Neuro: alert & orientedx3, cranial nerves grossly intact. moves all 4 extremities w/o difficulty. Affect pleasant  Telemetry: Reviewed,  A fib 70s-90s  Labs: Basic Metabolic Panel:  Recent Labs Lab 12/04/16 0946  12/08/16 0334 12/09/16 0342 12/10/16 0445 12/10/16 0910 12/11/16 0305  NA  --   < > 131* 131* 131* 133* 132*  K  --   < > 4.0 4.0 4.2 4.2 4.5  CL  --   < > 90* 89* 89* 90* 89*  CO2  --   < > 31 32 32 31 31  GLUCOSE  --   < > 243* 197* 191* 188* 162*  BUN  --   < > 49* 48* 47* 46* 44*  CREATININE  --   < > 2.28* 2.07* 1.91* 2.04* 2.08*  CALCIUM  --   < > 9.2 9.3 9.3 9.6 9.5  MG 1.8  --   --   --   --   --  1.8  < > = values in this interval not displayed.  Liver Function Tests: No results for input(s): AST, ALT, ALKPHOS, BILITOT, PROT, ALBUMIN in the last 168 hours. No results for input(s): LIPASE, AMYLASE in the last 168 hours. No results for input(s): AMMONIA in the last 168 hours.  CBC:  Recent Labs Lab 12/05/16 0400  12/07/16 0441 12/08/16 0334 12/09/16 0342 12/10/16 0445 12/11/16 0305  WBC 6.9  < > 6.5 7.9 8.3 8.1 8.1  NEUTROABS 4.6  --   --   --   --   --   --   HGB 8.5*  < > 7.8* 8.7* 8.7* 8.7* 8.7*  HCT 26.1*  < > 23.6* 26.1*  26.1* 26.7* 26.5* 26.7*  MCV 95.6  < > 95.5 94.6 95.4 95.7 95.7  PLT 98*  < > 108* 118* 126* 132* 139*  < > = values in this interval not displayed.  Cardiac Enzymes: No results for input(s): CKTOTAL, CKMB, CKMBINDEX, TROPONINI in the last 168 hours.  BNP: BNP (last 3 results)  Recent Labs  11/28/16 1104  BNP 1,825.3*    ProBNP (last 3 results)  Recent  Labs  11/27/16 1207  PROBNP 1,746.0*      Other results:  Imaging: No results found.   Medications:     Scheduled Medications: . amiodarone  200 mg Oral BID  . arformoterol  15 mcg Nebulization BID  . atorvastatin  40 mg Oral Daily  . budesonide (PULMICORT) nebulizer solution  0.5 mg Nebulization BID  . busPIRone  5 mg Oral BID  . digoxin  0.0625 mg Oral Daily  . feeding supplement (GLUCERNA SHAKE)  237 mL Oral BID BM  . fluticasone  1 spray Each Nare Daily  . furosemide  80 mg Intravenous BID  . insulin aspart  0-15 Units Subcutaneous TID WC  . insulin aspart  0-5 Units Subcutaneous QHS  . insulin aspart  4 Units Subcutaneous TID WC  . insulin glargine  10 Units Subcutaneous QHS  . levothyroxine  25 mcg Oral QAC breakfast  . multivitamin with minerals  1 tablet Oral Daily  . potassium chloride  40 mEq Oral BID  . umeclidinium bromide  1 puff Inhalation Daily    Infusions: . heparin 1,050 Units/hr (12/10/16 0905)  . milrinone 0.375 mcg/kg/min (12/10/16 2328)  . norepinephrine (LEVOPHED) Adult infusion 3 mcg/min (12/10/16 1123)    PRN Medications: acetaminophen, albuterol, nitroGLYCERIN, ondansetron (ZOFRAN) IV, sodium chloride flush, traMADol, traZODone   Assessment/Plan/Discussion    1. Acute on chronic systolic CHF: Ischemic cardiomyopathy but with prominent RV dysfunction on echo and exam.  [employer] ICD.  Echo this admission shows EF 20-25% with moderately dilated LV and moderately dilated/moderately dysfunction RV with severe TR.  He was admitted with low output biventricular failure, cardiorenal syndrome, and marked volume overload. He is improved with dual inotrope support.  He did not appear to have RV failure markedly out of proportion to LV on RHC, CVP/PCWP was 0.56 by RHC. Upon weaning down inotropic support and cutting back to po diuretics, CVP rose and co-ox is dropped to 44% => 55% with increase in milrinone to 0.25 => 68% with increase in milrinone to  0.375.  Norepinephrine added back on 2/4, creatinine at 2.08 today with CVP 13-14 and co-ox 60%.  Weight down 3 lbs. -  Continue IV Lasix today.      - Continue milrinone 0.375 + norepinephrine to 3.  Creatinine still > 2.  I think he will need IABP for optimization prior to LVAD placement.  Will arrange for IABP this morning, discussed risks/benefits with patient and he agrees to proceed.  - Continue digoxin 0.0625 daily. Level ok.  - Hold Entresto, spironolactone, and hydralazine/Imdur with soft BP. - No BB with low output.  - LVAD work up ongoing. Has seen surgeon, HFSW, and VAD coordinator.  He failed attempt to wean down on support, tentative plan for LVAD Friday with TV repair.  Will discuss with transplant center.  2. CAD: s/p CABG.  No signs/sx of ischemia. Continue statin.  Does not need ASA with stable CAD on anticoagulation.  3. AKI on CKD stage III: Suspect cardiorenal syndrome in setting of low output failure/RV failure.  Renal function stabilized with inotropes, but creatinine increased with attempt to wean inotropes.  Back on combination of milrinone + norepinephrine, creatinine 2.08 today.  As above, plan for IABP today. 4. Atrial fibrillation: Chronic.  Rate is not elevated. - Currently stable on heparin gtt/off Eliquis.  5. Aortic stenosis s/p TAVR: Stable on echo.  6. NSVT: On amiodarone while we have him on inotropes. Keep K>4.0, Mg > 2.0.   7. Anemia: No overt bleeding.  Has had IV Fe this admission. FOBT negative.  1 unit PRBCs on 2/2 with appropriate bump.  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  8. Hyponatremia: Na increased with tolvaptan.  No more tolvaptan, will continue fluid restriction.   Loralie Champagne, MD  12/11/2016, 7:53 AM  Advanced Heart Failure Team Pager [phone] (M-F; 7a - 4p)  Please contact [facility] for night-coverage after hours (4p -7a ) and weekends on [email or web address]

## 2016-12-11 NOTE — Progress Notes (Signed)
Orthopedic Tech Progress Note Patient Details:  Trevor [name] Dec 14, 1945 [id number]  Ortho Devices Type of Ortho Device: Knee Immobilizer Ortho Device/Splint Location: rle Ortho Device/Splint Interventions: Application   Hildred Priest 12/11/2016, 12:48 PM

## 2016-12-11 NOTE — Progress Notes (Signed)
Orthopedic Tech Progress Note Patient Details:  Trevor [name] Jan 15, 1946 [id number]  Patient ID: Trevor [name], male   DOB: 30-Jul-1946, 71 y.o.   MRN: [id number]   Trevor [name] 12/11/2016, 12:48 PM Viewed order from RN order list

## 2016-12-11 NOTE — Interval H&P Note (Signed)
History and Physical Interval Note:  12/11/2016 9:47 AM  Trevor [name]  has presented today for surgery, with the diagnosis of shock  The various methods of treatment have been discussed with the patient and family. After consideration of risks, benefits and other options for treatment, the patient has consented to  Procedure(s): IABP Insertion (N/A) Right Heart Cath (N/A) as a surgical intervention .  [name] history has been reviewed, patient examined, no change in status, stable for surgery.  I have reviewed the patient's chart and labs.  Questions were answered to the patient's satisfaction.     Eilidh Marcano [employer]

## 2016-12-12 ENCOUNTER — Encounter: Payer: Self-pay | Admitting: Internal Medicine

## 2016-12-12 ENCOUNTER — Inpatient Hospital Stay (HOSPITAL_COMMUNITY): Payer: Medicare Other

## 2016-12-12 DIAGNOSIS — R570 Cardiogenic shock: Secondary | ICD-10-CM

## 2016-12-12 LAB — BASIC METABOLIC PANEL
ANION GAP: 12 (ref 5–15)
BUN: 37 mg/dL — AB (ref 6–20)
CHLORIDE: 87 mmol/L — AB (ref 101–111)
CO2: 31 mmol/L (ref 22–32)
Calcium: 9.3 mg/dL (ref 8.9–10.3)
Creatinine, Ser: 1.71 mg/dL — ABNORMAL HIGH (ref 0.61–1.24)
GFR calc Af Amer: 45 mL/min — ABNORMAL LOW (ref >60)
GFR, EST NON AFRICAN AMERICAN: 38 mL/min — AB (ref >60)
GLUCOSE: 132 mg/dL — AB (ref 65–99)
Potassium: 4.2 mmol/L (ref 3.5–5.1)
Sodium: 130 mmol/L — ABNORMAL LOW (ref 135–145)

## 2016-12-12 LAB — CBC
HCT: 27.8 % — ABNORMAL LOW (ref 39.0–52.0)
HCT: 27.5 % — ABNORMAL LOW (ref 39.0–52.0)
HEMOGLOBIN: 9 g/dL — AB (ref 13.0–17.0)
HEMOGLOBIN: 9 g/dL — AB (ref 13.0–17.0)
MCH: 31.1 pg (ref 26.0–34.0)
MCH: 31.3 pg (ref 26.0–34.0)
MCHC: 32.4 g/dL (ref 30.0–36.0)
MCHC: 32.7 g/dL (ref 30.0–36.0)
MCV: 96.2 fL (ref 78.0–100.0)
MCV: 95.5 fL (ref 78.0–100.0)
PLATELETS: 121 10*3/uL — AB (ref 150–400)
Platelets: 131 10*3/uL — ABNORMAL LOW (ref 150–400)
RBC: 2.89 MIL/uL — AB (ref 4.22–5.81)
RBC: 2.88 MIL/uL — ABNORMAL LOW (ref 4.22–5.81)
RDW: 19 % — ABNORMAL HIGH (ref 11.5–15.5)
RDW: 18.8 % — ABNORMAL HIGH (ref 11.5–15.5)
WBC: 9.8 10*3/uL (ref 4.0–10.5)
WBC: 10 10*3/uL (ref 4.0–10.5)

## 2016-12-12 LAB — GLUCOSE, CAPILLARY
GLUCOSE-CAPILLARY: 127 mg/dL — AB (ref 65–99)
Glucose-Capillary: 143 mg/dL — ABNORMAL HIGH (ref 65–99)
Glucose-Capillary: 144 mg/dL — ABNORMAL HIGH (ref 65–99)
Glucose-Capillary: 197 mg/dL — ABNORMAL HIGH (ref 65–99)
Glucose-Capillary: 128 mg/dL — ABNORMAL HIGH (ref 65–99)

## 2016-12-12 LAB — COOXEMETRY PANEL
CARBOXYHEMOGLOBIN: 1.9 % — AB (ref 0.5–1.5)
Methemoglobin: 1.2 % (ref 0.0–1.5)
O2 Saturation: 67.1 %
TOTAL HEMOGLOBIN: 9.4 g/dL — AB (ref 12.0–16.0)

## 2016-12-12 LAB — TROPONIN I: TROPONIN I: 0.06 ng/mL — AB (ref <0.03)

## 2016-12-12 LAB — HEPARIN LEVEL (UNFRACTIONATED): HEPARIN UNFRACTIONATED: 0.46 [IU]/mL (ref 0.30–0.70)

## 2016-12-12 LAB — PREPARE RBC (CROSSMATCH)

## 2016-12-12 MED ORDER — CHLORHEXIDINE GLUCONATE CLOTH 2 % EX PADS
6.0000 | MEDICATED_PAD | Freq: Once | CUTANEOUS | Status: AC
  Administered 2016-12-12: 6 via TOPICAL

## 2016-12-12 MED ORDER — SODIUM CHLORIDE 0.9 % IV SOLN
0.0000 ug/min | INTRAVENOUS | Status: DC
Start: 2016-12-13 — End: 2016-12-13
  Filled 2016-12-12: qty 2

## 2016-12-12 MED ORDER — DOBUTAMINE IN D5W 4-5 MG/ML-% IV SOLN
2.0000 ug/kg/min | INTRAVENOUS | Status: DC
  Filled 2016-12-12: qty 250

## 2016-12-12 MED ORDER — SODIUM CHLORIDE 0.9 % IV SOLN
600.0000 mg | INTRAVENOUS | Status: AC
  Administered 2016-12-13: 600 mg via INTRAVENOUS
  Filled 2016-12-12: qty 600

## 2016-12-12 MED ORDER — CHLORHEXIDINE GLUCONATE CLOTH 2 % EX PADS
6.0000 | MEDICATED_PAD | Freq: Once | CUTANEOUS | Status: AC
  Administered 2016-12-13: 6 via TOPICAL

## 2016-12-12 MED ORDER — BISACODYL 5 MG PO TBEC
5.0000 mg | DELAYED_RELEASE_TABLET | Freq: Once | ORAL | Status: AC
  Administered 2016-12-12: 5 mg via ORAL
  Filled 2016-12-12: qty 1

## 2016-12-12 MED ORDER — SODIUM CHLORIDE 0.9 % IV SOLN
INTRAVENOUS | Status: DC
  Filled 2016-12-12: qty 30

## 2016-12-12 MED ORDER — TRANEXAMIC ACID (OHS) BOLUS VIA INFUSION
15.0000 mg/kg | INTRAVENOUS | Status: AC
  Administered 2016-12-13: 1263 mg via INTRAVENOUS
  Filled 2016-12-12: qty 1263

## 2016-12-12 MED ORDER — SODIUM CHLORIDE 0.9 % IV SOLN
INTRAVENOUS | Status: AC
  Administered 2016-12-13: 2 [IU]/h via INTRAVENOUS
  Filled 2016-12-12: qty 2.5

## 2016-12-12 MED ORDER — DOPAMINE-DEXTROSE 3.2-5 MG/ML-% IV SOLN
0.0000 ug/kg/min | INTRAVENOUS | Status: AC
Start: 2016-12-13 — End: 2016-12-13
  Administered 2016-12-13: 2 ug/kg/min via INTRAVENOUS
  Filled 2016-12-12: qty 250

## 2016-12-12 MED ORDER — EPINEPHRINE PF 1 MG/ML IJ SOLN
0.0000 ug/min | INTRAMUSCULAR | Status: AC
Start: 2016-12-13 — End: 2016-12-13
  Administered 2016-12-13: 2 ug/min via INTRAVENOUS
  Administered 2016-12-13: 3 ug/min via INTRAVENOUS
  Filled 2016-12-12: qty 4

## 2016-12-12 MED ORDER — DIAZEPAM 5 MG PO TABS
5.0000 mg | ORAL_TABLET | Freq: Once | ORAL | Status: AC
  Administered 2016-12-13: 5 mg via ORAL
  Filled 2016-12-12: qty 1

## 2016-12-12 MED ORDER — VANCOMYCIN HCL 10 G IV SOLR
1500.0000 mg | INTRAVENOUS | Status: AC
  Administered 2016-12-13: 1500 mg via INTRAVENOUS
  Filled 2016-12-12: qty 1500

## 2016-12-12 MED ORDER — MUPIROCIN 2 % EX OINT
1.0000 "application " | TOPICAL_OINTMENT | Freq: Two times a day (BID) | CUTANEOUS | Status: DC
  Administered 2016-12-12: 1 via NASAL
  Filled 2016-12-12: qty 22

## 2016-12-12 MED ORDER — VANCOMYCIN HCL 1000 MG IV SOLR
1000.0000 mg | INTRAVENOUS | Status: AC
  Administered 2016-12-13: 1000 mg
  Filled 2016-12-12 (×2): qty 1000

## 2016-12-12 MED ORDER — MILRINONE LACTATE IN DEXTROSE 20-5 MG/100ML-% IV SOLN
0.3000 ug/kg/min | INTRAVENOUS | Status: DC
  Filled 2016-12-12: qty 100

## 2016-12-12 MED ORDER — TEMAZEPAM 7.5 MG PO CAPS
15.0000 mg | ORAL_CAPSULE | Freq: Once | ORAL | Status: AC | PRN
  Administered 2016-12-12: 15 mg via ORAL
  Filled 2016-12-12: qty 2

## 2016-12-12 MED ORDER — FLUCONAZOLE IN SODIUM CHLORIDE 400-0.9 MG/200ML-% IV SOLN
400.0000 mg | INTRAVENOUS | Status: AC
  Administered 2016-12-13: 400 mg via INTRAVENOUS
  Filled 2016-12-12: qty 200

## 2016-12-12 MED ORDER — TRANEXAMIC ACID (OHS) PUMP PRIME SOLUTION
2.0000 mg/kg | INTRAVENOUS | Status: DC
  Filled 2016-12-12: qty 1.68

## 2016-12-12 MED ORDER — DEXTROSE 5 % IV SOLN
1.5000 g | INTRAVENOUS | Status: AC
  Administered 2016-12-13: 1.5 g via INTRAVENOUS
  Administered 2016-12-13: .75 g via INTRAVENOUS
  Filled 2016-12-12: qty 1.5

## 2016-12-12 MED ORDER — CEFUROXIME SODIUM 750 MG IJ SOLR
750.0000 mg | INTRAMUSCULAR | Status: DC
  Filled 2016-12-12: qty 750

## 2016-12-12 MED ORDER — CHLORHEXIDINE GLUCONATE 0.12 % MT SOLN
15.0000 mL | Freq: Once | OROMUCOSAL | Status: AC
  Administered 2016-12-13: 15 mL via OROMUCOSAL
  Filled 2016-12-12: qty 15

## 2016-12-12 MED ORDER — NITROGLYCERIN IN D5W 200-5 MCG/ML-% IV SOLN
0.0000 ug/min | INTRAVENOUS | Status: DC
  Filled 2016-12-12: qty 250

## 2016-12-12 MED ORDER — DEXMEDETOMIDINE HCL IN NACL 400 MCG/100ML IV SOLN
0.1000 ug/kg/h | INTRAVENOUS | Status: AC
  Administered 2016-12-13: .3 ug/kg/h via INTRAVENOUS
  Filled 2016-12-12: qty 100

## 2016-12-12 MED ORDER — TRANEXAMIC ACID 1000 MG/10ML IV SOLN
1.5000 mg/kg/h | INTRAVENOUS | Status: AC
  Administered 2016-12-13: 1.5 mg/kg/h via INTRAVENOUS
  Filled 2016-12-12: qty 25

## 2016-12-12 MED ORDER — VASOPRESSIN 20 UNIT/ML IV SOLN
0.0400 [IU]/min | INTRAVENOUS | Status: DC
  Filled 2016-12-12: qty 2

## 2016-12-12 MED ORDER — MAGNESIUM SULFATE 50 % IJ SOLN
40.0000 meq | INTRAMUSCULAR | Status: DC
  Filled 2016-12-12: qty 10

## 2016-12-12 MED ORDER — POTASSIUM CHLORIDE 2 MEQ/ML IV SOLN
80.0000 meq | INTRAVENOUS | Status: DC
  Filled 2016-12-12: qty 40

## 2016-12-12 MED ORDER — NOREPINEPHRINE BITARTRATE 1 MG/ML IV SOLN
0.0000 ug/min | INTRAVENOUS | Status: DC
  Filled 2016-12-12: qty 4

## 2016-12-12 NOTE — Progress Notes (Signed)
Hep gtt paused per verbal orders from Dr. Prescott Gum to allow R IJ CVC to be d/c'd. Will pull CVC in ~1hr and resume hep gtt ~1hr after CVC d/c'd

## 2016-12-12 NOTE — Progress Notes (Signed)
Right IJ triple Lumen central line removed. During removal, patient instructed to take a deep breath and hold. Patient stable during removal. Vaseline guaze applied to site with pressure tape. Will continue to monitor patient.

## 2016-12-12 NOTE — Progress Notes (Signed)
Pt refused CPAP for tonight.  

## 2016-12-12 NOTE — Progress Notes (Signed)
ANTICOAGULATION CONSULT NOTE - Follow Up Consult  Pharmacy Consult for Heparin Indication: atrial fibrillation, s/p IABP   Allergies  Allergen Reactions  . No Known Allergies     Patient Measurements: Height: 5\' 5"  (165.1 cm) Weight: 185 lb 10 oz (84.2 kg) IBW/kg (Calculated) : 61.5  Vital Signs: Temp: 97.7 F (36.5 C) (02/08 0815) Temp Source: Core (Comment) (02/08 0800) BP: 110/77 (02/08 0800) Pulse Rate: 63 (02/08 0815)  Labs:  Recent Labs  12/09/16 1216 12/09/16 2130  12/10/16 0445 12/10/16 0910 12/11/16 0305 12/11/16 2112 12/12/16 0351  HGB  --   --   < > 8.7*  --  8.7*  --  9.0*  HCT  --   --   --  26.5*  --  26.7*  --  27.8*  PLT  --   --   --  132*  --  139*  --  131*  APTT 136* 69*  --  62*  --   --   --   --   LABPROT  --   --   --  16.1*  --   --   --   --   INR  --   --   --  1.28  --   --   --   --   HEPARINUNFRC 0.90* 0.73*  --  0.70  --  0.47 0.45 0.46  CREATININE  --   --   < > 1.91* 2.04* 2.08*  --  1.71*  < > = values in this interval not displayed.  Estimated Creatinine Clearance: 39.6 mL/min (by C-G formula based on SCr of 1.71 mg/dL (H)).   Medications:  Heparin @ 1050 units/hr  Assessment: 70yom on apixaban pta for afib (last dose 1/31 @ 2110), transitioned to IV heparin anticipating LVAD placement (scheduled for 2/9). IABP placed yesterday for support until LVAD. Heparin level is therapeutic at 0.46. CBC stable. No bleeding.  Goal of Therapy:  Heparin level 0.2-0.5 Monitor platelets by anticoagulation protocol: Yes   Plan:  1) Continue heparin at 1050 units/hr 2) Daily heparin level and CBC 3) LVAD tomorrow  Deboraha Sprang 12/12/2016,8:53 AM

## 2016-12-12 NOTE — Progress Notes (Signed)
Trevor [name] has been discussed with the VAD Medical Review board on 12/02/2016. The team feels as if the patient is a good candidate for Destination LVAD therapy. The patient meets criteria for a LVAD implant as listed below:  1)  Has failed to respond to optimal medical management (including beta-blockers and ACE inhibitors if tolerated) for at least 45 of the last 60 days, or have been balloon dependent for 7 days, or IV inotrope dependent for 14 days; and,       *On Inotropes Milrinone started 11/28/2016 and Levophed started 12/08/2016  2)  Has a left ventricular ejection fraction (LVEF) < 25% and, have demonstrated functional limitation with a peak oxygen consumption of <14 ml/kg/min unless balloon pump or inotrope dependent or physically unable to perform the test.         *EF 20-25%  By echo (date)  12/10/2016            *CPX (date)  pVO2 ____ RER_____ VE/VCo2 slope_____ *unable to perform due to inotropes  3)  Social work and palliative care evaluations demonstrate appropriate support system in place for discharge to home with a VAD and that end of life discussions have taken place. Both services have expressed no concern regarding patient's candidacy.         *Social work consult (date): 12/10/2016        *Palliative Care Consult (date): 12/11/2016  4)  Primary caretaker identified that can be taught along with the patient how to manage        the VAD equipment.        *Name: [name] (sister)  5)  Deemed appropriate by our financial coordinator: Cecilio Asper        Prior approval: 12/09/2016  6)  VAD Coordinator, Balinda Quails RN has met with patient and caregiver, shown them the VAD equipment and discussed with the patient and caregiver about lifestyle changes necessary for success on mechanical circulatory device.        *Met with patient and [name] on 12/04/2016.       *Consent for VAD Evaluation/Caregiver Agreement/HIPPA Release/Photo Release signed on 12/04/2016  7)  Patient has been discussed  with [facility] (Dr.Ted Pilar Plate) and felt to be an appropriate candidate.    8)  Intermacs profile: 2  INTERMACS 1: Critical cardiogenic shock describes a patient who is "crashing and burning", in which a patient has life-threatening hypotension and rapidly escalating inotropic pressor support, with critical organ hypoperfusion often confirmed by worsening acidosis and lactate levels.  INTERMACS 2: Progressive decline describes a patient who has been demonstrated "dependent" on inotropic support but nonetheless shows signs of continuing deterioration in nutrition, renal function, fluid retention, or other major status indicator. Patient profile 2 can also describe a patient with refractory volume overload, perhaps with evidence of impaired perfusion, in whom inotropic infusions cannot be maintained due to tachyarrhythmias, clinical ischemia, or other intolerance.  INTERMACS 3: Stable but inotrope dependent describes a patient who is clinically stable on mild-moderate doses of intravenous inotropes (or has a temporary circulatory support device) after repeated documentation of failure to wean without symptomatic hypotension, worsening symptoms, or progressive organ dysfunction (usually renal). It is critical to monitor nutrition, renal function, fluid balance, and overall status carefully in order to distinguish between a  patient who is truly stable at Patient Profile 3 and a patient who has unappreciated decline rendering them Patient Profile 2. This patient may be either at home or in the hospital.   INTERMACS 4:  Resting symptoms describes a patient who is at home on oral therapy but frequently has symptoms of congestion at rest or with activities of daily living (ADL). He or she may have orthopnea, shortness of breath during ADL such as dressing or bathing, gastrointestinal symptoms (abdominal discomfort, nausea, poor appetite), disabling ascites or severe lower extremity edema. This patient should be  carefully considered for more intensive management and surveillance programs, which may in some cases, reveal poor compliance that would compromise outcomes with any therapy.  .  INTERMACS 5: Exertion Intolerant describes a patient who is comfortable at rest but unable to engage in any activity, living predominantly within the house or housebound. This patient has no congestive symptoms, but may have chronically elevated volume status, frequently with renal dysfunction, and may be characterized as exercise intolerant.   INTERMACS 6: Exertion Limited also describes a patient who is comfortable at rest without evidence of fluid overload, but who is able to do some mild activity. Activities of daily living are comfortable and minor activities outside the home such as visiting friends or going to a restaurant can be performed, but fatigue results within a few minutes of any meaningful physical exertion. This patient has occasional episodes of worsening symptoms and is likely to have had a hospitalization for heart failure within the past year.   INTERMACS 7: Advanced NYHA Class 3 describes a patient who is clinically stable with a reasonable level of comfortable activity, despite history of previous decompensation that is not recent. This patient is usually able to walk more than a block. Any decompensation requiring intravenous diuretics or hospitalization within the previous month should make this person a Patient Profile 6 or lower.    9)  NYHA Class: 4

## 2016-12-12 NOTE — Anesthesia Preprocedure Evaluation (Addendum)
Anesthesia Evaluation  Patient identified by MRN, date of birth, ID band Patient awake    Reviewed: Allergy & Precautions, NPO status , Patient's Chart, lab work & pertinent test results  Airway Mallampati: III  TM Distance: >3 FB Neck ROM: Full    Dental  (+) Dental Advisory Given   Pulmonary asthma , sleep apnea ,    breath sounds clear to auscultation       Cardiovascular hypertension, Pt. on medications and Pt. on home beta blockers + CAD, + CABG and +CHF  + Cardiac Defibrillator + Valvular Problems/Murmurs (AS s/p TAVR)  Rhythm:Regular Rate:Normal  Left ventricle: The cavity size was at the upper limits of  normal. Wall thickness was increased in a pattern of mild LVH. Systolic function was severely reduced. The estimated ejection fraction was in the range of 20% to 25%. Diffuse hypokinesis. - Mitral valve: Moderately calcified annulus. There was moderate regurgitation. - Right ventricle: The cavity size was moderately dilated. Pacer wire or catheter noted in right ventricle. Systolic function was mildly to moderately reduced. - Tricuspid valve: There was severe regurgitation.   Neuro/Psych negative neurological ROS     GI/Hepatic   Endo/Other  diabetes  Renal/GU ARF and CRFRenal disease     Musculoskeletal   Abdominal   Peds  Hematology  (+) Blood dyscrasia (Thrombocytopenia), anemia ,   Anesthesia Other Findings   Reproductive/Obstetrics                           Lab Results  Component Value Date   WBC 11.2 (H) 12/13/2016   HGB 9.4 (L) 12/13/2016   HCT 28.3 (L) 12/13/2016   MCV 96.3 12/13/2016   PLT 106 (L) 12/13/2016   Lab Results  Component Value Date   CREATININE 1.56 (H) 12/13/2016   BUN 35 (H) 12/13/2016   NA 130 (L) 12/13/2016   K 4.8 12/13/2016   CL 87 (L) 12/13/2016   CO2 32 12/13/2016   Lab Results  Component Value Date   INR 1.28 12/10/2016   INR 1.20  12/09/2016   INR 1.24 12/08/2016    Anesthesia Physical Anesthesia Plan  ASA: IV  Anesthesia Plan: General   Post-op Pain Management:    Induction: Intravenous  Airway Management Planned: Oral ETT  Additional Equipment: Arterial line, TEE, CVP, PA Cath and Ultrasound Guidance Line Placement  Intra-op Plan:   Post-operative Plan: Post-operative intubation/ventilation  Informed Consent: I have reviewed the patients History and Physical, chart, labs and discussed the procedure including the risks, benefits and alternatives for the proposed anesthesia with the patient or authorized representative who has indicated his/her understanding and acceptance.   Dental advisory given  Plan Discussed with:   Anesthesia Plan Comments:        Anesthesia Quick Evaluation

## 2016-12-12 NOTE — Progress Notes (Addendum)
Advanced Heart Failure Rounding Note   Subjective:    Admitted with marked volume overload and low output HF.  BP soft so he was started on milrinone 0.25 mcg + norepi 3 mcg.   Coox 44.4% 1/31 OFF norepi and on milrinone 0.125 mcg/kg/min. Milrinone increased to 0.375 and norepinephrine 3 added back.   He got 1 unit PRBCs on 2/3.  FOBT negative.  2/7 IABP placed. Creatinine down to 1.7. CO-OX 67% on milrinone 0.375 mcg + norepi 3 mcg. Increased urine output.  Weight down 4 lbs.   Creatinine  2.08>1.7.    Denies SOB. Complaining of fatigue. Marland Kitchen    SWAN numbers CVP 11 PAP 56/25 (35)  PCWP 21  PVR 2.69 CO/CI 5.2/2.7  Echo: EF 20-25%, moderate LV dilation, bioprosthetic aortic valve ok, moderate MR, RV moderately dilated with moderate dysfunction, severe TR.   Renal u/s with cortical thinning R 12.0 cm L 12.2 cm  1/29 RHC Procedural Findings (on milrinone 0.25 + norepinephrine 2): Hemodynamics (mmHg) RA mean 9 RV 52/11 PA 52/16, mean 31 PCWP mean 16 CVP/PCWP = 0.56 Oxygen saturations: PA 63% AO 95% Cardiac Output (Fick) 6.93  Cardiac Index (Fick) 3.55 PVR 2.1 WU Cardiac Output (Thermo) 6.16 Cardiac Index (Thermo) 3.16  PVR 2.4 WU  2/7 RHC Procedural Findings (on milrinone 0.25 + norepinephrine 3): Hemodynamics (mmHg) RA mean 14 RV 62/15 PA 69/25, mean 38 PCWP mean 22 CVP/PCWP 0.63 Oxygen saturations: PA 64% AO 96% Cardiac Output (Fick) 6.78  Cardiac Index (Fick) 3.51 PVR 3.83 WU Cardiac Output (Thermo) 4.85 Cardiac Index (Thermo) 2.51  PVR 5.36 WU  Objective:   Weight Range:  Vital Signs:   Temp:  [97 F (36.1 C)-98.6 F (37 C)] 97.7 F (36.5 C) (02/08 0700) Pulse Rate:  [0-175] 69 (02/08 0700) Resp:  [0-20] 13 (02/08 0700) BP: (88-135)/(58-101) 114/80 (02/08 0700) SpO2:  [0 %-100 %] 93 % (02/08 0700) Weight:  [185 lb 10 oz (84.2 kg)] 185 lb 10 oz (84.2 kg) (02/08 0351) Last BM Date: 12/11/16  Weight change: Filed Weights   12/10/16 0500  12/11/16 0423 12/12/16 0351  Weight: 192 lb 1.6 oz (87.1 kg) 189 lb 14.4 oz (86.1 kg) 185 lb 10 oz (84.2 kg)    Intake/Output:   Intake/Output Summary (Last 24 hours) at 12/12/16 0722 Last data filed at 12/12/16 0700  Gross per 24 hour  Intake          1522.22 ml  Output             5200 ml  Net         -3677.78 ml     Physical Exam: CVP 12 General:  Elderly appearing. resp difficulty. In bed  HEENT: normal.  Neck: supple. JVP ~10. Carotids 2+ bilat; no bruits. No thyromegaly or nodule noted.  Cor: PMI nondisplaced. Irregular rate & rhythm. 2/6 HSM LLSB, 2/6 SEM RUSB.  Lungs: CTAB  Abdomen: obese soft, NT, ND, no HSM. No bruits or masses. +BS  Extremities: no cyanosis, clubbing, rash.  No edema. R leg immobilizer. Groin sites stable. Neuro: alert & orientedx3, cranial nerves grossly intact. moves all 4 extremities w/o difficulty. Affect pleasant GU: Foley   Telemetry: Reviewed,  A fib 70s-90s  Labs: Basic Metabolic Panel:  Recent Labs Lab 12/09/16 0342 12/10/16 0445 12/10/16 0910 12/11/16 0305 12/12/16 0351  NA 131* 131* 133* 132* 130*  K 4.0 4.2 4.2 4.5 4.2  CL 89* 89* 90* 89* 87*  CO2 32 32 31  31 31  GLUCOSE 197* 191* 188* 162* 132*  BUN 48* 47* 46* 44* 37*  CREATININE 2.07* 1.91* 2.04* 2.08* 1.71*  CALCIUM 9.3 9.3 9.6 9.5 9.3  MG  --   --   --  1.8  --     Liver Function Tests: No results for input(s): AST, ALT, ALKPHOS, BILITOT, PROT, ALBUMIN in the last 168 hours. No results for input(s): LIPASE, AMYLASE in the last 168 hours. No results for input(s): AMMONIA in the last 168 hours.  CBC:  Recent Labs Lab 12/08/16 0334 12/09/16 0342 12/10/16 0445 12/11/16 0305 12/12/16 0351  WBC 7.9 8.3 8.1 8.1 9.8  HGB 8.7* 8.7* 8.7* 8.7* 9.0*  HCT 26.1*  26.1* 26.7* 26.5* 26.7* 27.8*  MCV 94.6 95.4 95.7 95.7 96.2  PLT 118* 126* 132* 139* 131*    Cardiac Enzymes: No results for input(s): CKTOTAL, CKMB, CKMBINDEX, TROPONINI in the last 168  hours.  BNP: BNP (last 3 results)  Recent Labs  11/28/16 1104  BNP 1,825.3*    ProBNP (last 3 results)  Recent Labs  11/27/16 1207  PROBNP 1,746.0*      Other results:  Imaging: Dg Chest Port 1 View  Result Date: 12/11/2016 CLINICAL DATA:  Evaluate aortic balloon pump EXAM: PORTABLE CHEST 1 VIEW COMPARISON:  12/07/2016 FINDINGS: Cardiac shadow remains enlarged. Transcatheter aortic valve replacement is again seen. Postsurgical changes are noted. A defibrillator is seen. Swan-Ganz catheter from a femoral approach is noted within the right lower lobe pulmonary artery. Previously seen right jugular central line is again noted and stable. Intra-aortic balloon pump is now seen with the tip overlying the aortic knob. The lungs are clear bilaterally. IMPRESSION: Tubes and lines as described above. No other acute abnormality is noted. Electronically Signed   By: Inez Catalina M.D.   On: 12/11/2016 12:55     Medications:     Scheduled Medications: . amiodarone  200 mg Oral BID  . arformoterol  15 mcg Nebulization BID  . atorvastatin  40 mg Oral Daily  . budesonide (PULMICORT) nebulizer solution  0.5 mg Nebulization BID  . busPIRone  5 mg Oral BID  . Chlorhexidine Gluconate Cloth  6 each Topical Daily  . digoxin  0.0625 mg Oral Daily  . feeding supplement (GLUCERNA SHAKE)  237 mL Oral BID BM  . fluticasone  1 spray Each Nare Daily  . furosemide  80 mg Intravenous BID  . insulin aspart  0-15 Units Subcutaneous TID WC  . insulin aspart  0-5 Units Subcutaneous QHS  . insulin aspart  4 Units Subcutaneous TID WC  . insulin glargine  10 Units Subcutaneous QHS  . levothyroxine  25 mcg Oral QAC breakfast  . multivitamin with minerals  1 tablet Oral Daily  . potassium chloride  40 mEq Oral BID  . sodium chloride flush  3 mL Intravenous Q12H  . umeclidinium bromide  1 puff Inhalation Daily    Infusions: . heparin 1,050 Units/hr (12/11/16 1345)  . milrinone 0.375 mcg/kg/min  (12/11/16 2102)  . norepinephrine (LEVOPHED) Adult infusion 3 mcg/min (12/11/16 1126)    PRN Medications: sodium chloride, acetaminophen, albuterol, alum & mag hydroxide-simeth, guaiFENesin-dextromethorphan, nitroGLYCERIN, ondansetron (ZOFRAN) IV, sodium chloride flush, sodium chloride flush, traMADol, traZODone   Assessment/Plan/Discussion    1. Acute on chronic systolic CHF: Ischemic cardiomyopathy but with prominent RV dysfunction on echo and exam.  [employer] ICD.  Echo this admission shows EF 20-25% with moderately dilated LV and moderately dilated/moderately dysfunction RV with severe TR.  He  was admitted with low output biventricular failure, cardiorenal syndrome, and marked volume overload. He is improved with dual inotrope support.  He did not appear to have RV failure markedly out of proportion to LV on RHC, CVP/PCWP was 0.56 by RHC. Upon weaning down inotropic support and cutting back to po diuretics, CVP rose and co-ox is dropped to 44% => 55% with increase in milrinone to 0.25 => 68% with increase in milrinone to 0.375.  Norepinephrine added back on 2/4 and IABP placed on 2/7, creatinine at 1.7 today with CVP 11 and co-ox 67%.  Weight down 4 lbs. - Continue IABP 1:1 with heparin gtt.  - Continue IV Lasix today at same dose.      - Continue milrinone 0.375 + norepinephrine to 3.  Creatinine down to 1.7 with IABP. - Continue digoxin 0.0625 daily. Level ok.  - Hold Entresto, spironolactone, and hydralazine/Imdur with soft BP. - No BB with low output.  - LVAD work up ---> Evaluated by cardiac surgeon, HFSW, palliative care, and VAD coordinator.  He failed attempt to wean down on support, plan for LVAD Friday with TV repair.  Discussed with Dr Artemio Aly at [facility], agrees that LVAD is indicated.  2. CAD: s/p CABG.  No signs/sx of ischemia. Continue statin.  Does not need ASA with stable CAD on anticoagulation.  3. AKI on CKD stage III: Suspect cardiorenal syndrome in setting of low output  failure/RV failure.  Renal function stabilized with inotropes, but creatinine increased with attempt to wean inotropes.  Back on combination of milrinone + norepinephrine, creatinine down to 1.71. IABP in place.  4. Atrial fibrillation: Chronic.  Rate is not elevated. - Currently stable on heparin gtt/off Eliquis.  5. Aortic stenosis s/p TAVR: Stable on echo.  6. NSVT: On amiodarone while we have him on inotropes. Keep K>4.0, Mg > 2.0.   7. Anemia: No overt bleeding.  Has had IV Fe this admission. FOBT negative.  1 unit PRBCs on 2/2 with appropriate bump.  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 Hgb stable at 9.0.  8. Hyponatremia: Na 130.    Darrick Grinder, NP  12/12/2016, 7:22 AM  Advanced Heart Failure Team Pager [phone] (M-F; [place])  Please contact [facility] for night-coverage after hours (4p -7a ) and weekends on [email or web address]  Patient seen with NP, agree with the above note.  Improved hemodynamics with IABP, good diuresis and weight down.  Swan numbers improved.  Creatinine down to 1.7.  Will continue current support including IABP at 1:1.  Continue current Lasix.   He is ready for LVAD placement tomorrow.   35 minutes critical care time.   Loralie Champagne 12/12/2016 7:47 AM

## 2016-12-12 NOTE — Progress Notes (Signed)
1 Day Post-Op Procedure(s) (LRB): IABP Insertion (N/A) Right Heart Cath (N/A) Subjective: The patient is currently stable with balloon pump and inotropes for ischemic cardiomyopathy, compensated cardiogenic shock, class IV  acute on chronic systolic CHF. [name] renal function has improved over the past 48 hours as well as urine output. The patient has been scheduled for HeartMate 2 left ventricular assist device implantation for destination therapy indication. The patient understands this is a high risk operation due to his critical condition and there is risk of postop renal failure requiring dialysis, postop right heart failure requiring right ventricular mechanical assist support with a percutaneous pump, postoperative respiratory insufficiency requiring ventilator support and risk of postoperative infection and death. The patient understands that he is not currently a transplant candidate and is not on a transplant list-she understands the ventricle assist device is not being placed for a bridge transplantation but for destination therapy.  Objective: Vital signs in last 24 hours: Temp:  [97.4 F (36.3 C)-98.6 F (37 C)] 98.6 F (37 C) (02/08 1745) Pulse Rate:  [21-175] 65 (02/08 1745) Cardiac Rhythm: Atrial fibrillation (02/08 1600) Resp:  [8-21] 16 (02/08 1745) BP: (88-126)/(56-101) 109/72 (02/08 1745) SpO2:  [90 %-100 %] 98 % (02/08 1745) Weight:  [185 lb 10 oz (84.2 kg)] 185 lb 10 oz (84.2 kg) (02/08 0351)  Hemodynamic parameters for last 24 hours: PAP: (36-61)/(15-32) 58/28 CVP:  [5 mmHg-20 mmHg] 17 mmHg PCWP:  [21 mmHg-22 mmHg] 22 mmHg CO:  [5.2 L/min-5.8 L/min] 5.8 L/min CI:  [2.7 L/min/m2-3 L/min/m2] 3 L/min/m2  Intake/Output from previous day: 02/07 0701 - 02/08 0700 In: 1522.2 [P.O.:720; I.V.:752.2; IV Piggyback:50] Out: 5200 [Urine:5200] Intake/Output this shift: Total I/O In: 326 [I.V.:326] Out: 1925 [Urine:1925]  Alert and responsive Breath sounds  clear Atrial fibrillation Extremities warm with mild edema Abdomen soft nontender  Lab Results:  Recent Labs  12/12/16 0351 12/12/16 0918  WBC 9.8 10.0  HGB 9.0* 9.0*  HCT 27.8* 27.5*  PLT 131* 121*   BMET:  Recent Labs  12/11/16 0305 12/12/16 0351  NA 132* 130*  K 4.5 4.2  CL 89* 87*  CO2 31 31  GLUCOSE 162* 132*  BUN 44* 37*  CREATININE 2.08* 1.71*  CALCIUM 9.5 9.3    PT/INR:  Recent Labs  12/10/16 0445  LABPROT 16.1*  INR 1.28   ABG    Component Value Date/Time   PHART 7.501 (H) 12/02/2016 1055   HCO3 31.4 (H) 12/11/2016 1009   HCO3 29.9 (H) 12/11/2016 1009   TCO2 33 12/11/2016 1009   TCO2 31 12/11/2016 1009   O2SAT 67.1 12/12/2016 0400   CBG (last 3)   Recent Labs  12/12/16 0910 12/12/16 1121 12/12/16 1618  GLUCAP 127* 144* 197*    Assessment/Plan: S/P Procedure(s) (LRB): IABP Insertion (N/A) Right Heart Cath (N/A) Left ventricular assist device implantation in a.m. Benefits of the procedure and risks involved have been reviewed and discussed with the patient  LOS: 14 days    Trevor [name] 12/12/2016

## 2016-12-12 NOTE — Progress Notes (Signed)
Patient unable to complete 6 minute walk test today. On IABP.   Neurocognitive trail making completed correctly in 3 minutes.   [address] Cardiomyopathy, EQ-5D-3L and{pre-VAD QOL completed by the patient independently.  Balinda Quails RN, VAD Coordinator 24/7 pager [phone]

## 2016-12-13 ENCOUNTER — Inpatient Hospital Stay (HOSPITAL_COMMUNITY): Payer: Medicare Other | Admitting: Anesthesiology

## 2016-12-13 ENCOUNTER — Encounter (HOSPITAL_COMMUNITY)
Admission: EM | Disposition: A | Discharge status: Home Health | Payer: Self-pay | Source: Home / Self Care | Attending: Cardiology

## 2016-12-13 ENCOUNTER — Inpatient Hospital Stay (HOSPITAL_COMMUNITY): Payer: Medicare Other

## 2016-12-13 ENCOUNTER — Encounter: Payer: Self-pay | Admitting: Internal Medicine

## 2016-12-13 DIAGNOSIS — I361 Nonrheumatic tricuspid (valve) insufficiency: Secondary | ICD-10-CM

## 2016-12-13 DIAGNOSIS — I5023 Acute on chronic systolic (congestive) heart failure: Secondary | ICD-10-CM

## 2016-12-13 HISTORY — PX: INSERTION OF IMPLANTABLE LEFT VENTRICULAR ASSIST DEVICE: SHX5866

## 2016-12-13 HISTORY — PX: TRICUSPID VALVE REPLACEMENT: SHX816

## 2016-12-13 HISTORY — PX: TEE WITHOUT CARDIOVERSION: SHX5443

## 2016-12-13 LAB — COOXEMETRY PANEL
CARBOXYHEMOGLOBIN: 1.9 % — AB (ref 0.5–1.5)
Carboxyhemoglobin: 2.1 % — ABNORMAL HIGH (ref 0.5–1.5)
Carboxyhemoglobin: 2.3 % — ABNORMAL HIGH (ref 0.5–1.5)
METHEMOGLOBIN: 1.1 % (ref 0.0–1.5)
Methemoglobin: 1.8 % — ABNORMAL HIGH (ref 0.0–1.5)
Methemoglobin: 2.2 % — ABNORMAL HIGH (ref 0.0–1.5)
O2 SAT: 71 %
O2 Saturation: 70.8 %
O2 Saturation: 63 %
Total hemoglobin: 10.3 g/dL — ABNORMAL LOW (ref 12.0–16.0)
Total hemoglobin: 9.2 g/dL — ABNORMAL LOW (ref 12.0–16.0)
Total hemoglobin: 7.4 g/dL — ABNORMAL LOW (ref 12.0–16.0)

## 2016-12-13 LAB — POCT I-STAT, CHEM 8
BUN: 33 mg/dL — AB (ref 6–20)
BUN: 31 mg/dL — ABNORMAL HIGH (ref 6–20)
BUN: 32 mg/dL — AB (ref 6–20)
BUN: 30 mg/dL — ABNORMAL HIGH (ref 6–20)
BUN: 31 mg/dL — ABNORMAL HIGH (ref 6–20)
BUN: 34 mg/dL — ABNORMAL HIGH (ref 6–20)
BUN: 35 mg/dL — AB (ref 6–20)
BUN: 30 mg/dL — AB (ref 6–20)
BUN: 31 mg/dL — AB (ref 6–20)
CALCIUM ION: 1.16 mmol/L (ref 1.15–1.40)
CALCIUM ION: 1.15 mmol/L (ref 1.15–1.40)
CALCIUM ION: 1.15 mmol/L (ref 1.15–1.40)
CALCIUM ION: 1.05 mmol/L — AB (ref 1.15–1.40)
CALCIUM ION: 1.01 mmol/L — AB (ref 1.15–1.40)
CHLORIDE: 88 mmol/L — AB (ref 101–111)
CHLORIDE: 91 mmol/L — AB (ref 101–111)
CHLORIDE: 94 mmol/L — AB (ref 101–111)
CREATININE: 1.2 mg/dL (ref 0.61–1.24)
CREATININE: 1.3 mg/dL — AB (ref 0.61–1.24)
CREATININE: 1.4 mg/dL — AB (ref 0.61–1.24)
CREATININE: 1.6 mg/dL — AB (ref 0.61–1.24)
Calcium, Ion: 1.08 mmol/L — ABNORMAL LOW (ref 1.15–1.40)
Calcium, Ion: 1.26 mmol/L (ref 1.15–1.40)
Calcium, Ion: 1.17 mmol/L (ref 1.15–1.40)
Calcium, Ion: 1.15 mmol/L (ref 1.15–1.40)
Chloride: 90 mmol/L — ABNORMAL LOW (ref 101–111)
Chloride: 91 mmol/L — ABNORMAL LOW (ref 101–111)
Chloride: 88 mmol/L — ABNORMAL LOW (ref 101–111)
Chloride: 91 mmol/L — ABNORMAL LOW (ref 101–111)
Chloride: 91 mmol/L — ABNORMAL LOW (ref 101–111)
Chloride: 94 mmol/L — ABNORMAL LOW (ref 101–111)
Creatinine, Ser: 1.4 mg/dL — ABNORMAL HIGH (ref 0.61–1.24)
Creatinine, Ser: 1.3 mg/dL — ABNORMAL HIGH (ref 0.61–1.24)
Creatinine, Ser: 1.3 mg/dL — ABNORMAL HIGH (ref 0.61–1.24)
Creatinine, Ser: 1.3 mg/dL — ABNORMAL HIGH (ref 0.61–1.24)
Creatinine, Ser: 1.3 mg/dL — ABNORMAL HIGH (ref 0.61–1.24)
GLUCOSE: 157 mg/dL — AB (ref 65–99)
GLUCOSE: 146 mg/dL — AB (ref 65–99)
GLUCOSE: 144 mg/dL — AB (ref 65–99)
GLUCOSE: 141 mg/dL — AB (ref 65–99)
GLUCOSE: 75 mg/dL (ref 65–99)
Glucose, Bld: 140 mg/dL — ABNORMAL HIGH (ref 65–99)
Glucose, Bld: 156 mg/dL — ABNORMAL HIGH (ref 65–99)
Glucose, Bld: 147 mg/dL — ABNORMAL HIGH (ref 65–99)
Glucose, Bld: 129 mg/dL — ABNORMAL HIGH (ref 65–99)
HCT: 28 % — ABNORMAL LOW (ref 39.0–52.0)
HCT: 28 % — ABNORMAL LOW (ref 39.0–52.0)
HCT: 29 % — ABNORMAL LOW (ref 39.0–52.0)
HCT: 24 % — ABNORMAL LOW (ref 39.0–52.0)
HCT: 27 % — ABNORMAL LOW (ref 39.0–52.0)
HCT: 25 % — ABNORMAL LOW (ref 39.0–52.0)
HCT: 21 % — ABNORMAL LOW (ref 39.0–52.0)
HEMATOCRIT: 24 % — AB (ref 39.0–52.0)
HEMATOCRIT: 26 % — AB (ref 39.0–52.0)
HEMOGLOBIN: 9.5 g/dL — AB (ref 13.0–17.0)
HEMOGLOBIN: 8.2 g/dL — AB (ref 13.0–17.0)
HEMOGLOBIN: 8.2 g/dL — AB (ref 13.0–17.0)
HEMOGLOBIN: 8.5 g/dL — AB (ref 13.0–17.0)
HEMOGLOBIN: 8.8 g/dL — AB (ref 13.0–17.0)
Hemoglobin: 9.5 g/dL — ABNORMAL LOW (ref 13.0–17.0)
Hemoglobin: 9.9 g/dL — ABNORMAL LOW (ref 13.0–17.0)
Hemoglobin: 9.2 g/dL — ABNORMAL LOW (ref 13.0–17.0)
Hemoglobin: 7.1 g/dL — ABNORMAL LOW (ref 13.0–17.0)
POTASSIUM: 4.5 mmol/L (ref 3.5–5.1)
POTASSIUM: 4.2 mmol/L (ref 3.5–5.1)
POTASSIUM: 3.9 mmol/L (ref 3.5–5.1)
Potassium: 4.5 mmol/L (ref 3.5–5.1)
Potassium: 4 mmol/L (ref 3.5–5.1)
Potassium: 4.2 mmol/L (ref 3.5–5.1)
Potassium: 4 mmol/L (ref 3.5–5.1)
Potassium: 5.3 mmol/L — ABNORMAL HIGH (ref 3.5–5.1)
Potassium: 4.4 mmol/L (ref 3.5–5.1)
SODIUM: 133 mmol/L — AB (ref 135–145)
SODIUM: 132 mmol/L — AB (ref 135–145)
SODIUM: 134 mmol/L — AB (ref 135–145)
Sodium: 131 mmol/L — ABNORMAL LOW (ref 135–145)
Sodium: 134 mmol/L — ABNORMAL LOW (ref 135–145)
Sodium: 133 mmol/L — ABNORMAL LOW (ref 135–145)
Sodium: 131 mmol/L — ABNORMAL LOW (ref 135–145)
Sodium: 133 mmol/L — ABNORMAL LOW (ref 135–145)
Sodium: 136 mmol/L (ref 135–145)
TCO2: 36 mmol/L (ref 0–100)
TCO2: 32 mmol/L (ref 0–100)
TCO2: 33 mmol/L (ref 0–100)
TCO2: 33 mmol/L (ref 0–100)
TCO2: 31 mmol/L (ref 0–100)
TCO2: 32 mmol/L (ref 0–100)
TCO2: 32 mmol/L (ref 0–100)
TCO2: 29 mmol/L (ref 0–100)
TCO2: 26 mmol/L (ref 0–100)

## 2016-12-13 LAB — GLUCOSE, CAPILLARY
Glucose-Capillary: 149 mg/dL — ABNORMAL HIGH (ref 65–99)
Glucose-Capillary: 106 mg/dL — ABNORMAL HIGH (ref 65–99)
Glucose-Capillary: 74 mg/dL (ref 65–99)
Glucose-Capillary: 83 mg/dL (ref 65–99)
Glucose-Capillary: 123 mg/dL — ABNORMAL HIGH (ref 65–99)

## 2016-12-13 LAB — CBC
HCT: 28.3 % — ABNORMAL LOW (ref 39.0–52.0)
HCT: 27.5 % — ABNORMAL LOW (ref 39.0–52.0)
HCT: 20.6 % — ABNORMAL LOW (ref 39.0–52.0)
HEMOGLOBIN: 9.4 g/dL — AB (ref 13.0–17.0)
Hemoglobin: 9.4 g/dL — ABNORMAL LOW (ref 13.0–17.0)
Hemoglobin: 7.2 g/dL — ABNORMAL LOW (ref 13.0–17.0)
MCH: 32 pg (ref 26.0–34.0)
MCH: 31.6 pg (ref 26.0–34.0)
MCH: 32.1 pg (ref 26.0–34.0)
MCHC: 33.2 g/dL (ref 30.0–36.0)
MCHC: 34.2 g/dL (ref 30.0–36.0)
MCHC: 35 g/dL (ref 30.0–36.0)
MCV: 96.3 fL (ref 78.0–100.0)
MCV: 92.6 fL (ref 78.0–100.0)
MCV: 92 fL (ref 78.0–100.0)
PLATELETS: 106 10*3/uL — AB (ref 150–400)
Platelets: 88 10*3/uL — ABNORMAL LOW (ref 150–400)
Platelets: 136 10*3/uL — ABNORMAL LOW (ref 150–400)
RBC: 2.94 MIL/uL — ABNORMAL LOW (ref 4.22–5.81)
RBC: 2.97 MIL/uL — ABNORMAL LOW (ref 4.22–5.81)
RBC: 2.24 MIL/uL — ABNORMAL LOW (ref 4.22–5.81)
RDW: 18.6 % — AB (ref 11.5–15.5)
RDW: 18.9 % — ABNORMAL HIGH (ref 11.5–15.5)
RDW: 18.7 % — ABNORMAL HIGH (ref 11.5–15.5)
WBC: 11.2 10*3/uL — ABNORMAL HIGH (ref 4.0–10.5)
WBC: 20.7 10*3/uL — ABNORMAL HIGH (ref 4.0–10.5)
WBC: 19.1 10*3/uL — ABNORMAL HIGH (ref 4.0–10.5)

## 2016-12-13 LAB — HEMOGLOBIN AND HEMATOCRIT, BLOOD
HCT: 20.7 % — ABNORMAL LOW (ref 39.0–52.0)
Hemoglobin: 7 g/dL — ABNORMAL LOW (ref 13.0–17.0)

## 2016-12-13 LAB — POCT I-STAT 3, ART BLOOD GAS (G3+)
ACID-BASE EXCESS: 3 mmol/L — AB (ref 0.0–2.0)
ACID-BASE EXCESS: 4 mmol/L — AB (ref 0.0–2.0)
Acid-Base Excess: 9 mmol/L — ABNORMAL HIGH (ref 0.0–2.0)
Acid-Base Excess: 9 mmol/L — ABNORMAL HIGH (ref 0.0–2.0)
Acid-Base Excess: 5 mmol/L — ABNORMAL HIGH (ref 0.0–2.0)
BICARBONATE: 26.9 mmol/L (ref 20.0–28.0)
Bicarbonate: 33 mmol/L — ABNORMAL HIGH (ref 20.0–28.0)
Bicarbonate: 33.2 mmol/L — ABNORMAL HIGH (ref 20.0–28.0)
Bicarbonate: 29.8 mmol/L — ABNORMAL HIGH (ref 20.0–28.0)
Bicarbonate: 29 mmol/L — ABNORMAL HIGH (ref 20.0–28.0)
O2 SAT: 100 %
O2 Saturation: 100 %
O2 Saturation: 91 %
O2 Saturation: 99 %
O2 Saturation: 98 %
PCO2 ART: 45.5 mmHg (ref 32.0–48.0)
PCO2 ART: 33.5 mmHg (ref 32.0–48.0)
PH ART: 7.482 — AB (ref 7.350–7.450)
PH ART: 7.472 — AB (ref 7.350–7.450)
PH ART: 7.321 — AB (ref 7.350–7.450)
PH ART: 7.51 — AB (ref 7.350–7.450)
PO2 ART: 350 mmHg — AB (ref 83.0–108.0)
PO2 ART: 96 mmHg (ref 83.0–108.0)
Patient temperature: 36.5
Patient temperature: 36.3
TCO2: 34 mmol/L (ref 0–100)
TCO2: 35 mmol/L (ref 0–100)
TCO2: 32 mmol/L (ref 0–100)
TCO2: 30 mmol/L (ref 0–100)
TCO2: 28 mmol/L (ref 0–100)
pCO2 arterial: 44.1 mmHg (ref 32.0–48.0)
pCO2 arterial: 57.6 mmHg — ABNORMAL HIGH (ref 32.0–48.0)
pCO2 arterial: 41 mmHg (ref 32.0–48.0)
pH, Arterial: 7.455 — ABNORMAL HIGH (ref 7.350–7.450)
pO2, Arterial: 284 mmHg — ABNORMAL HIGH (ref 83.0–108.0)
pO2, Arterial: 68 mmHg — ABNORMAL LOW (ref 83.0–108.0)
pO2, Arterial: 108 mmHg (ref 83.0–108.0)

## 2016-12-13 LAB — POCT I-STAT 4, (NA,K, GLUC, HGB,HCT)
GLUCOSE: 58 mg/dL — AB (ref 65–99)
HEMATOCRIT: 29 % — AB (ref 39.0–52.0)
HEMOGLOBIN: 9.9 g/dL — AB (ref 13.0–17.0)
Potassium: 4.2 mmol/L (ref 3.5–5.1)
Sodium: 137 mmol/L (ref 135–145)

## 2016-12-13 LAB — BASIC METABOLIC PANEL
ANION GAP: 11 (ref 5–15)
Anion gap: 10 (ref 5–15)
BUN: 35 mg/dL — ABNORMAL HIGH (ref 6–20)
BUN: 30 mg/dL — ABNORMAL HIGH (ref 6–20)
CALCIUM: 9.3 mg/dL (ref 8.9–10.3)
CO2: 32 mmol/L (ref 22–32)
CO2: 28 mmol/L (ref 22–32)
Calcium: 8.2 mg/dL — ABNORMAL LOW (ref 8.9–10.3)
Chloride: 87 mmol/L — ABNORMAL LOW (ref 101–111)
Chloride: 97 mmol/L — ABNORMAL LOW (ref 101–111)
Creatinine, Ser: 1.56 mg/dL — ABNORMAL HIGH (ref 0.61–1.24)
Creatinine, Ser: 1.44 mg/dL — ABNORMAL HIGH (ref 0.61–1.24)
GFR calc Af Amer: 55 mL/min — ABNORMAL LOW (ref >60)
GFR calc non Af Amer: 43 mL/min — ABNORMAL LOW (ref >60)
GFR calc non Af Amer: 47 mL/min — ABNORMAL LOW (ref >60)
GFR, EST AFRICAN AMERICAN: 50 mL/min — AB (ref >60)
Glucose, Bld: 149 mg/dL — ABNORMAL HIGH (ref 65–99)
Glucose, Bld: 72 mg/dL (ref 65–99)
Potassium: 4.8 mmol/L (ref 3.5–5.1)
Potassium: 4.1 mmol/L (ref 3.5–5.1)
SODIUM: 130 mmol/L — AB (ref 135–145)
Sodium: 135 mmol/L (ref 135–145)

## 2016-12-13 LAB — DIC (DISSEMINATED INTRAVASCULAR COAGULATION)PANEL
D-Dimer, Quant: 0.6 ug/mL-FEU — ABNORMAL HIGH (ref 0.00–0.50)
Fibrinogen: 335 mg/dL (ref 210–475)
INR: 1.57
Platelets: 89 10*3/uL — ABNORMAL LOW (ref 150–400)
Prothrombin Time: 18.9 seconds — ABNORMAL HIGH (ref 11.4–15.2)
Smear Review: NONE SEEN
aPTT: 41 seconds — ABNORMAL HIGH (ref 24–36)

## 2016-12-13 LAB — MAGNESIUM
Magnesium: 1.8 mg/dL (ref 1.7–2.4)
Magnesium: 2.5 mg/dL — ABNORMAL HIGH (ref 1.7–2.4)

## 2016-12-13 LAB — FIBRINOGEN
Fibrinogen: 386 mg/dL (ref 210–475)
Fibrinogen: 292 mg/dL (ref 210–475)

## 2016-12-13 LAB — PROTIME-INR
INR: 1.67
Prothrombin Time: 19.9 seconds — ABNORMAL HIGH (ref 11.4–15.2)

## 2016-12-13 LAB — CREATININE, SERUM
Creatinine, Ser: 1.65 mg/dL — ABNORMAL HIGH (ref 0.61–1.24)
GFR calc Af Amer: 47 mL/min — ABNORMAL LOW (ref >60)
GFR calc non Af Amer: 40 mL/min — ABNORMAL LOW (ref >60)

## 2016-12-13 LAB — PLATELET COUNT: Platelets: 73 10*3/uL — ABNORMAL LOW (ref 150–400)

## 2016-12-13 LAB — HEPARIN LEVEL (UNFRACTIONATED): HEPARIN UNFRACTIONATED: 0.34 [IU]/mL (ref 0.30–0.70)

## 2016-12-13 LAB — PREPARE RBC (CROSSMATCH)

## 2016-12-13 SURGERY — INSERTION OF IMPLANTABLE LEFT VENTRICULAR ASSIST DEVICE
Anesthesia: General | Site: Chest

## 2016-12-13 MED ORDER — LACTATED RINGERS IV SOLN
INTRAVENOUS | Status: DC | PRN
  Administered 2016-12-13 (×2): via INTRAVENOUS

## 2016-12-13 MED ORDER — VANCOMYCIN HCL IN DEXTROSE 1-5 GM/200ML-% IV SOLN
1000.0000 mg | Freq: Two times a day (BID) | INTRAVENOUS | Status: DC
  Filled 2016-12-13: qty 200

## 2016-12-13 MED ORDER — VANCOMYCIN HCL 1000 MG IV SOLR
INTRAVENOUS | Status: DC | PRN
  Administered 2016-12-13: 1000 mg

## 2016-12-13 MED ORDER — OXYCODONE HCL 5 MG PO TABS
5.0000 mg | ORAL_TABLET | ORAL | Status: DC | PRN
  Administered 2016-12-15 – 2016-12-16 (×5): 10 mg via ORAL
  Administered 2016-12-16: 5 mg via ORAL
  Administered 2016-12-16 – 2016-12-18 (×6): 10 mg via ORAL
  Administered 2016-12-18: 5 mg via ORAL
  Administered 2016-12-18 – 2016-12-30 (×7): 10 mg via ORAL
  Filled 2016-12-13 (×5): qty 2
  Filled 2016-12-13: qty 1
  Filled 2016-12-13 (×3): qty 2
  Filled 2016-12-13: qty 1
  Filled 2016-12-13 (×10): qty 2

## 2016-12-13 MED ORDER — BISACODYL 5 MG PO TBEC
10.0000 mg | DELAYED_RELEASE_TABLET | Freq: Every day | ORAL | Status: DC
  Administered 2016-12-16 – 2017-01-01 (×11): 10 mg via ORAL
  Filled 2016-12-13 (×13): qty 2

## 2016-12-13 MED ORDER — MAGNESIUM SULFATE 4 GM/100ML IV SOLN
4.0000 g | Freq: Once | INTRAVENOUS | Status: AC
  Administered 2016-12-13: 4 g via INTRAVENOUS
  Filled 2016-12-13: qty 100

## 2016-12-13 MED ORDER — CHLORHEXIDINE GLUCONATE 0.12% ORAL RINSE (MEDLINE KIT)
15.0000 mL | Freq: Two times a day (BID) | OROMUCOSAL | Status: DC
  Administered 2016-12-13 – 2016-12-15 (×5): 15 mL via OROMUCOSAL

## 2016-12-13 MED ORDER — HEPARIN SODIUM (PORCINE) 1000 UNIT/ML IJ SOLN
INTRAMUSCULAR | Status: DC | PRN
  Administered 2016-12-13: 22000 [IU] via INTRAVENOUS

## 2016-12-13 MED ORDER — INSULIN REGULAR BOLUS VIA INFUSION
0.0000 [IU] | Freq: Three times a day (TID) | INTRAVENOUS | Status: DC
  Filled 2016-12-13: qty 10

## 2016-12-13 MED ORDER — SODIUM CHLORIDE 0.9 % IJ SOLN
INTRAMUSCULAR | Status: DC | PRN
  Administered 2016-12-13 (×3): 4 mL via TOPICAL

## 2016-12-13 MED ORDER — LACTATED RINGERS IV SOLN: INTRAVENOUS | Status: DC

## 2016-12-13 MED ORDER — AMIODARONE HCL 200 MG PO TABS
200.0000 mg | ORAL_TABLET | Freq: Two times a day (BID) | ORAL | Status: DC
  Administered 2016-12-13: 200 mg
  Filled 2016-12-13: qty 1

## 2016-12-13 MED ORDER — ASPIRIN EC 325 MG PO TBEC
325.0000 mg | DELAYED_RELEASE_TABLET | Freq: Every day | ORAL | Status: DC
  Administered 2016-12-16 – 2016-12-18 (×3): 325 mg via ORAL
  Filled 2016-12-13 (×3): qty 1

## 2016-12-13 MED ORDER — DOCUSATE SODIUM 100 MG PO CAPS
200.0000 mg | ORAL_CAPSULE | Freq: Every day | ORAL | Status: DC
  Administered 2016-12-16 – 2017-01-01 (×13): 200 mg via ORAL
  Filled 2016-12-13 (×13): qty 2

## 2016-12-13 MED ORDER — SODIUM CHLORIDE 0.9 % IV SOLN
Freq: Once | INTRAVENOUS | Status: AC
  Administered 2016-12-13: 10 mL/h via INTRAVENOUS

## 2016-12-13 MED ORDER — ACETAMINOPHEN 160 MG/5ML PO SOLN: 650.0000 mg | Freq: Once | ORAL | Status: AC

## 2016-12-13 MED ORDER — SODIUM CHLORIDE 0.9 % IV SOLN
20.0000 ug | INTRAVENOUS | Status: AC
  Administered 2016-12-13: 20 ug via INTRAVENOUS
  Filled 2016-12-13: qty 5

## 2016-12-13 MED ORDER — SODIUM CHLORIDE 0.9 % IR SOLN
Status: DC | PRN
  Administered 2016-12-13: 6000 mL

## 2016-12-13 MED ORDER — COAGULATION FACTOR VIIA RECOMB 1 MG IV SOLR
45.0000 ug/kg | Freq: Once | INTRAVENOUS | Status: AC
  Administered 2016-12-14: 4000 ug via INTRAVENOUS
  Filled 2016-12-13: qty 4

## 2016-12-13 MED ORDER — SODIUM CHLORIDE 0.9% FLUSH: 10.0000 mL | INTRAVENOUS | Status: DC | PRN

## 2016-12-13 MED ORDER — ETOMIDATE 2 MG/ML IV SOLN
INTRAVENOUS | Status: DC | PRN
  Administered 2016-12-13: 6 mg via INTRAVENOUS

## 2016-12-13 MED ORDER — MIDAZOLAM HCL 10 MG/2ML IJ SOLN
INTRAMUSCULAR | Status: AC
  Filled 2016-12-13: qty 2

## 2016-12-13 MED ORDER — SODIUM CHLORIDE 0.9 % IV SOLN
30.0000 meq | Freq: Once | INTRAVENOUS | Status: DC
  Filled 2016-12-13: qty 15

## 2016-12-13 MED ORDER — PANTOPRAZOLE SODIUM 40 MG PO TBEC: 40.0000 mg | DELAYED_RELEASE_TABLET | Freq: Every day | ORAL | Status: DC

## 2016-12-13 MED ORDER — SODIUM CHLORIDE 0.9 % IV SOLN: 250.0000 mL | INTRAVENOUS | Status: DC

## 2016-12-13 MED ORDER — VANCOMYCIN HCL 1000 MG IV SOLR
INTRAVENOUS | Status: AC
  Filled 2016-12-13: qty 1000

## 2016-12-13 MED ORDER — FLUCONAZOLE IN SODIUM CHLORIDE 400-0.9 MG/200ML-% IV SOLN
400.0000 mg | Freq: Once | INTRAVENOUS | Status: AC
  Administered 2016-12-14: 400 mg via INTRAVENOUS
  Filled 2016-12-13: qty 200

## 2016-12-13 MED ORDER — SODIUM CHLORIDE 0.9% FLUSH
10.0000 mL | Freq: Two times a day (BID) | INTRAVENOUS | Status: DC
  Administered 2016-12-13 – 2016-12-14 (×2): 10 mL
  Administered 2016-12-14: 40 mL
  Administered 2016-12-15: 30 mL
  Administered 2016-12-16 – 2016-12-19 (×6): 10 mL

## 2016-12-13 MED ORDER — ASPIRIN 300 MG RE SUPP: 300.0000 mg | Freq: Every day | RECTAL | Status: DC

## 2016-12-13 MED ORDER — FENTANYL CITRATE (PF) 250 MCG/5ML IJ SOLN
INTRAMUSCULAR | Status: DC | PRN
  Administered 2016-12-13: 100 ug via INTRAVENOUS
  Administered 2016-12-13: 150 ug via INTRAVENOUS
  Administered 2016-12-13: 500 ug via INTRAVENOUS

## 2016-12-13 MED ORDER — HEMOSTATIC AGENTS (NO CHARGE) OPTIME
TOPICAL | Status: DC | PRN
  Administered 2016-12-13: 1 via TOPICAL

## 2016-12-13 MED ORDER — DEXMEDETOMIDINE HCL IN NACL 200 MCG/50ML IV SOLN
0.1000 ug/kg/h | INTRAVENOUS | Status: DC
  Administered 2016-12-13: 0.7 ug/kg/h via INTRAVENOUS
  Administered 2016-12-13: 0.5 ug/kg/h via INTRAVENOUS
  Administered 2016-12-14 (×5): 0.7 ug/kg/h via INTRAVENOUS
  Administered 2016-12-14: 0.5 ug/kg/h via INTRAVENOUS
  Administered 2016-12-14 – 2016-12-15 (×3): 0.7 ug/kg/h via INTRAVENOUS
  Filled 2016-12-13 (×11): qty 50

## 2016-12-13 MED ORDER — EPINEPHRINE PF 1 MG/ML IJ SOLN
0.0000 ug/min | INTRAVENOUS | Status: DC
  Administered 2016-12-13: 3 ug/min via INTRAVENOUS
  Administered 2016-12-14 – 2016-12-16 (×3): 5 ug/min via INTRAVENOUS
  Administered 2016-12-17: 2 ug/min via INTRAVENOUS
  Administered 2016-12-17: 5 ug/min via INTRAVENOUS
  Filled 2016-12-13 (×8): qty 4

## 2016-12-13 MED ORDER — ACETAMINOPHEN 500 MG PO TABS: 1000.0000 mg | ORAL_TABLET | Freq: Four times a day (QID) | ORAL | Status: DC

## 2016-12-13 MED ORDER — DEXTROSE 5 % IV SOLN
1.5000 g | Freq: Two times a day (BID) | INTRAVENOUS | Status: AC
  Administered 2016-12-13 – 2016-12-15 (×4): 1.5 g via INTRAVENOUS
  Filled 2016-12-13 (×4): qty 1.5

## 2016-12-13 MED ORDER — ACETAMINOPHEN 650 MG RE SUPP
650.0000 mg | Freq: Once | RECTAL | Status: AC
  Administered 2016-12-13: 650 mg via RECTAL

## 2016-12-13 MED ORDER — MORPHINE SULFATE (PF) 2 MG/ML IV SOLN
2.0000 mg | INTRAVENOUS | Status: DC | PRN
  Administered 2016-12-14: 2 mg via INTRAVENOUS
  Administered 2016-12-14: 4 mg via INTRAVENOUS
  Administered 2016-12-14: 2 mg via INTRAVENOUS
  Administered 2016-12-14 (×4): 4 mg via INTRAVENOUS
  Administered 2016-12-14: 2 mg via INTRAVENOUS
  Administered 2016-12-14 – 2016-12-15 (×8): 4 mg via INTRAVENOUS
  Filled 2016-12-13: qty 1
  Filled 2016-12-13 (×6): qty 2
  Filled 2016-12-13: qty 1
  Filled 2016-12-13 (×2): qty 2
  Filled 2016-12-13: qty 1
  Filled 2016-12-13 (×5): qty 2

## 2016-12-13 MED ORDER — SODIUM CHLORIDE 0.9 % IV SOLN: Freq: Once | INTRAVENOUS | Status: DC

## 2016-12-13 MED ORDER — TRAMADOL HCL 50 MG PO TABS
50.0000 mg | ORAL_TABLET | ORAL | Status: DC | PRN
  Administered 2016-12-16 – 2016-12-31 (×16): 100 mg via ORAL
  Filled 2016-12-13 (×16): qty 2

## 2016-12-13 MED ORDER — VANCOMYCIN HCL IN DEXTROSE 750-5 MG/150ML-% IV SOLN
750.0000 mg | Freq: Two times a day (BID) | INTRAVENOUS | Status: DC
  Administered 2016-12-13 – 2016-12-14 (×2): 750 mg via INTRAVENOUS
  Filled 2016-12-13 (×2): qty 150

## 2016-12-13 MED ORDER — SODIUM CHLORIDE 0.9% FLUSH: 3.0000 mL | INTRAVENOUS | Status: DC | PRN

## 2016-12-13 MED ORDER — LACTATED RINGERS IV SOLN
INTRAVENOUS | Status: DC | PRN
  Administered 2016-12-13: 09:00:00 via INTRAVENOUS

## 2016-12-13 MED ORDER — SODIUM CHLORIDE 0.45 % IV SOLN: INTRAVENOUS | Status: DC | PRN

## 2016-12-13 MED ORDER — SODIUM CHLORIDE 0.9 % IV SOLN
INTRAVENOUS | Status: DC
  Administered 2016-12-13: 19:00:00 via INTRAVENOUS
  Filled 2016-12-13: qty 2.5

## 2016-12-13 MED ORDER — LACTATED RINGERS IV SOLN
INTRAVENOUS | Status: DC
  Administered 2016-12-13 – 2016-12-18 (×4): via INTRAVENOUS

## 2016-12-13 MED ORDER — CHLORHEXIDINE GLUCONATE 0.12 % MT SOLN
15.0000 mL | OROMUCOSAL | Status: AC
  Administered 2016-12-13: 15 mL via OROMUCOSAL

## 2016-12-13 MED ORDER — ASPIRIN 81 MG PO CHEW
324.0000 mg | CHEWABLE_TABLET | Freq: Every day | ORAL | Status: DC
  Administered 2016-12-14: 324 mg
  Filled 2016-12-13: qty 4

## 2016-12-13 MED ORDER — PROTAMINE SULFATE 10 MG/ML IV SOLN
INTRAVENOUS | Status: DC | PRN
  Administered 2016-12-13: 10 mg via INTRAVENOUS
  Administered 2016-12-13: 190 mg via INTRAVENOUS
  Administered 2016-12-13: 50 mg via INTRAVENOUS

## 2016-12-13 MED ORDER — SODIUM CHLORIDE 0.9 % IV SOLN
INTRAVENOUS | Status: DC
  Administered 2016-12-13: 19:00:00 via INTRAVENOUS
  Administered 2016-12-16: 20 mL/h via INTRAVENOUS

## 2016-12-13 MED ORDER — CHLORHEXIDINE GLUCONATE CLOTH 2 % EX PADS
6.0000 | MEDICATED_PAD | Freq: Every day | CUTANEOUS | Status: DC
  Administered 2016-12-16 – 2016-12-26 (×10): 6 via TOPICAL

## 2016-12-13 MED ORDER — BISACODYL 10 MG RE SUPP
10.0000 mg | Freq: Every day | RECTAL | Status: DC
  Administered 2016-12-14 – 2016-12-15 (×2): 10 mg via RECTAL
  Filled 2016-12-13 (×3): qty 1

## 2016-12-13 MED ORDER — MORPHINE SULFATE (PF) 2 MG/ML IV SOLN: 1.0000 mg | INTRAVENOUS | Status: AC | PRN

## 2016-12-13 MED ORDER — MIDAZOLAM HCL 5 MG/5ML IJ SOLN
INTRAMUSCULAR | Status: DC | PRN
  Administered 2016-12-13: 3 mg via INTRAVENOUS
  Administered 2016-12-13: 1 mg via INTRAVENOUS
  Administered 2016-12-13: 3 mg via INTRAVENOUS
  Administered 2016-12-13: 2 mg via INTRAVENOUS
  Administered 2016-12-13: 1 mg via INTRAVENOUS

## 2016-12-13 MED ORDER — ALBUMIN HUMAN 5 % IV SOLN
INTRAVENOUS | Status: DC | PRN
  Administered 2016-12-13 (×2): via INTRAVENOUS

## 2016-12-13 MED ORDER — MIDAZOLAM HCL 2 MG/2ML IJ SOLN
2.0000 mg | INTRAMUSCULAR | Status: DC | PRN
  Administered 2016-12-14 (×8): 2 mg via INTRAVENOUS
  Filled 2016-12-13 (×9): qty 2

## 2016-12-13 MED ORDER — METOCLOPRAMIDE HCL 5 MG/ML IJ SOLN
10.0000 mg | Freq: Four times a day (QID) | INTRAMUSCULAR | Status: DC
  Administered 2016-12-13 – 2016-12-23 (×29): 10 mg via INTRAVENOUS
  Filled 2016-12-13 (×29): qty 2

## 2016-12-13 MED ORDER — RIFAMPIN 300 MG PO CAPS
600.0000 mg | ORAL_CAPSULE | Freq: Once | ORAL | Status: AC
Start: 2016-12-14 — End: 2016-12-14
  Administered 2016-12-14: 600 mg via ORAL
  Filled 2016-12-13: qty 2

## 2016-12-13 MED ORDER — ROCURONIUM BROMIDE 10 MG/ML (PF) SYRINGE
PREFILLED_SYRINGE | INTRAVENOUS | Status: DC | PRN
  Administered 2016-12-13 (×2): 50 mg via INTRAVENOUS
  Administered 2016-12-13: 100 mg via INTRAVENOUS
  Administered 2016-12-13 (×4): 50 mg via INTRAVENOUS

## 2016-12-13 MED ORDER — ALBUMIN HUMAN 5 % IV SOLN
250.0000 mL | INTRAVENOUS | Status: AC | PRN
  Administered 2016-12-13 (×4): 250 mL via INTRAVENOUS
  Filled 2016-12-13 (×2): qty 250

## 2016-12-13 MED ORDER — LACTATED RINGERS IV SOLN: 500.0000 mL | Freq: Once | INTRAVENOUS | Status: DC | PRN

## 2016-12-13 MED ORDER — FAMOTIDINE IN NACL 20-0.9 MG/50ML-% IV SOLN
20.0000 mg | Freq: Two times a day (BID) | INTRAVENOUS | Status: AC
  Administered 2016-12-13 – 2016-12-14 (×2): 20 mg via INTRAVENOUS
  Filled 2016-12-13 (×2): qty 50

## 2016-12-13 MED ORDER — MIDAZOLAM HCL 2 MG/2ML IJ SOLN
INTRAMUSCULAR | Status: AC
  Filled 2016-12-13: qty 2

## 2016-12-13 MED ORDER — HEMOSTATIC AGENTS (NO CHARGE) OPTIME
TOPICAL | Status: DC | PRN
  Administered 2016-12-13 (×2): 1 via TOPICAL

## 2016-12-13 MED ORDER — ORAL CARE MOUTH RINSE
15.0000 mL | Freq: Four times a day (QID) | OROMUCOSAL | Status: DC
  Administered 2016-12-14 – 2016-12-16 (×10): 15 mL via OROMUCOSAL

## 2016-12-13 MED ORDER — FENTANYL CITRATE (PF) 250 MCG/5ML IJ SOLN
INTRAMUSCULAR | Status: AC
  Filled 2016-12-13: qty 25

## 2016-12-13 MED ORDER — PROPOFOL 10 MG/ML IV BOLUS
INTRAVENOUS | Status: AC
  Filled 2016-12-13: qty 20

## 2016-12-13 MED ORDER — SODIUM CHLORIDE 0.9% FLUSH
3.0000 mL | Freq: Two times a day (BID) | INTRAVENOUS | Status: DC
  Administered 2016-12-17 – 2016-12-19 (×4): 3 mL via INTRAVENOUS

## 2016-12-13 MED ORDER — ACETAMINOPHEN 160 MG/5ML PO SOLN
1000.0000 mg | Freq: Four times a day (QID) | ORAL | Status: DC
  Administered 2016-12-14: 1000 mg
  Filled 2016-12-13: qty 40.6

## 2016-12-13 MED ORDER — DEXTROSE 50 % IV SOLN
INTRAVENOUS | Status: AC
  Administered 2016-12-13: 17 mL
  Filled 2016-12-13: qty 50

## 2016-12-13 MED FILL — Potassium Chloride Inj 2 mEq/ML: INTRAVENOUS | Qty: 40 | Status: AC

## 2016-12-13 MED FILL — Heparin Sodium (Porcine) Inj 1000 Unit/ML: INTRAMUSCULAR | Qty: 30 | Status: AC

## 2016-12-13 MED FILL — Magnesium Sulfate Inj 50%: INTRAMUSCULAR | Qty: 10 | Status: AC

## 2016-12-13 SURGICAL SUPPLY — 143 items
ADAPTER CARDIO PERF ANTE/RETRO (ADAPTER) ×4 IMPLANT
ADAPTER DLP PERFUSION .25INX2I (MISCELLANEOUS) ×4 IMPLANT
ANTEGRADE CPLG (MISCELLANEOUS) IMPLANT
APPLICATOR COTTON TIP 6IN STRL (MISCELLANEOUS) IMPLANT
APPLICATOR TIP COSEAL (VASCULAR PRODUCTS) ×4 IMPLANT
ATTRACTOMAT 16X20 MAGNETIC DRP (DRAPES) ×4 IMPLANT
BAG DECANTER FOR FLEXI CONT (MISCELLANEOUS) ×12 IMPLANT
BLADE CORE FAN STRYKER (BLADE) ×4 IMPLANT
BLADE STERNUM SYSTEM 6 (BLADE) ×4 IMPLANT
BLADE SURG 12 STRL SS (BLADE) ×4 IMPLANT
BLADE SURG 15 STRL LF DISP TIS (BLADE) ×4 IMPLANT
BLADE SURG 15 STRL SS (BLADE) ×4
BOOT SUTURE AID YELLOW STND (SUTURE) ×4 IMPLANT
CANISTER SUCTION 2500CC (MISCELLANEOUS) ×4 IMPLANT
CANNULA AORTIC ROOT 20012 (MISCELLANEOUS) ×4 IMPLANT
CANNULA ARTERIAL NVNT 3/8 20FR (MISCELLANEOUS) ×8 IMPLANT
CANNULA GUNDRY RCSP 15FR (MISCELLANEOUS) IMPLANT
CANNULA VENOUS LOW PROF 34X46 (CANNULA) ×4 IMPLANT
CATH CPB KIT VANTRIGT (MISCELLANEOUS) IMPLANT
CATH FOLEY 2WAY SLVR  5CC 14FR (CATHETERS) ×2
CATH FOLEY 2WAY SLVR 5CC 14FR (CATHETERS) ×2 IMPLANT
CATH HEART VENT LEFT (CATHETERS) IMPLANT
CATH HYDRAGLIDE XL THORACIC (CATHETERS) ×4 IMPLANT
CATH RETROPLEGIA CORONARY 14FR (CATHETERS) IMPLANT
CATH ROBINSON RED A/P 18FR (CATHETERS) ×16 IMPLANT
CATH THORACIC 28FR (CATHETERS) IMPLANT
CATH THORACIC 36FR RT ANG (CATHETERS) ×4 IMPLANT
CHLORAPREP W/TINT 26ML (MISCELLANEOUS) ×4 IMPLANT
CLIP FOGARTY SPRING 6M (CLIP) IMPLANT
CONN 1/2X1/2X1/2  BEN (MISCELLANEOUS) ×2
CONN 1/2X1/2X1/2 BEN (MISCELLANEOUS) ×2 IMPLANT
CONN 3/8X1/2 ST GISH (MISCELLANEOUS) ×8 IMPLANT
CONN ST 1/4X3/8  BEN (MISCELLANEOUS)
CONN ST 1/4X3/8 BEN (MISCELLANEOUS) IMPLANT
CONT SPEC 4OZ CLIKSEAL STRL BL (MISCELLANEOUS) ×4 IMPLANT
COVER SURGICAL LIGHT HANDLE (MISCELLANEOUS) ×4 IMPLANT
CRADLE DONUT ADULT HEAD (MISCELLANEOUS) ×4 IMPLANT
DRAIN CHANNEL 28F RND 3/8 FF (WOUND CARE) IMPLANT
DRAIN CHANNEL 32F RND 10.7 FF (WOUND CARE) ×8 IMPLANT
DRAPE BILATERAL SPLIT (DRAPES) ×4 IMPLANT
DRAPE CV SPLIT W-CLR ANES SCRN (DRAPES) ×4 IMPLANT
DRAPE INCISE IOBAN 66X45 STRL (DRAPES) ×4 IMPLANT
DRAPE SLUSH/WARMER DISC (DRAPES) ×4 IMPLANT
DRSG AQUACEL AG ADV 3.5X14 (GAUZE/BANDAGES/DRESSINGS) ×4 IMPLANT
ELECT BLADE 4.0 EZ CLEAN MEGAD (MISCELLANEOUS) ×8
ELECT BLADE 6.5 EXT (BLADE) ×4 IMPLANT
ELECT CAUTERY BLADE 6.4 (BLADE) ×4 IMPLANT
ELECT REM PT RETURN 9FT ADLT (ELECTROSURGICAL) ×4
ELECTRODE BLDE 4.0 EZ CLN MEGD (MISCELLANEOUS) ×4 IMPLANT
ELECTRODE REM PT RTRN 9FT ADLT (ELECTROSURGICAL) ×2 IMPLANT
FELT TEFLON 1X6 (MISCELLANEOUS) ×8 IMPLANT
FELT TEFLON 6X6 (MISCELLANEOUS) ×4 IMPLANT
GAUZE SPONGE 4X4 12PLY STRL (GAUZE/BANDAGES/DRESSINGS) ×8 IMPLANT
GEL ULTRASOUND 20GR AQUASONIC (MISCELLANEOUS) ×4 IMPLANT
GLOVE BIO SURGEON STRL SZ 6 (GLOVE) ×16 IMPLANT
GLOVE BIO SURGEON STRL SZ 6.5 (GLOVE) ×12 IMPLANT
GLOVE BIO SURGEON STRL SZ7 (GLOVE) IMPLANT
GLOVE BIO SURGEON STRL SZ7.5 (GLOVE) ×16 IMPLANT
GLOVE BIO SURGEONS STRL SZ 6.5 (GLOVE) ×4
GLOVE BIOGEL PI IND STRL 8.5 (GLOVE) ×2 IMPLANT
GLOVE BIOGEL PI INDICATOR 8.5 (GLOVE) ×2
GOWN STRL REUS W/ TWL LRG LVL3 (GOWN DISPOSABLE) ×8 IMPLANT
GOWN STRL REUS W/ TWL XL LVL3 (GOWN DISPOSABLE) ×4 IMPLANT
GOWN STRL REUS W/TWL LRG LVL3 (GOWN DISPOSABLE) ×8
GOWN STRL REUS W/TWL XL LVL3 (GOWN DISPOSABLE) ×4
HEMOSTAT POWDER SURGIFOAM 1G (HEMOSTASIS) ×16 IMPLANT
HEMOSTAT SURGICEL 2X14 (HEMOSTASIS) ×4 IMPLANT
HM II SYSTEM CONTROLLER ×4 IMPLANT
INSERT FOGARTY XLG (MISCELLANEOUS) IMPLANT
IV NS 1000ML (IV SOLUTION) ×6
IV NS 1000ML BAXH (IV SOLUTION) ×6 IMPLANT
KIT BASIN OR (CUSTOM PROCEDURE TRAY) ×4 IMPLANT
KIT CATH SUCT 8FR (CATHETERS) ×4 IMPLANT
KIT DRAINAGE VACCUM ASSIST (KITS) ×4 IMPLANT
KIT HM II VENT ASSIST DEV BP (Prosthesis & Implant Heart) ×4 IMPLANT
KIT ROOM TURNOVER OR (KITS) ×4 IMPLANT
KIT SUCTION CATH 14FR (SUCTIONS) ×8 IMPLANT
LEAD PACING MYOCARDI (MISCELLANEOUS) ×4 IMPLANT
LINE VENT (MISCELLANEOUS) ×4 IMPLANT
NS IRRIG 1000ML POUR BTL (IV SOLUTION) ×24 IMPLANT
PACK OPEN HEART (CUSTOM PROCEDURE TRAY) ×4 IMPLANT
PAD ARMBOARD 7.5X6 YLW CONV (MISCELLANEOUS) ×8 IMPLANT
PAD DEFIB R2 (MISCELLANEOUS) ×4 IMPLANT
PEDIATRIC SUCKERS (MISCELLANEOUS) ×4 IMPLANT
PUNCH AORTIC ROTATE 4.5MM 8IN (MISCELLANEOUS) ×4 IMPLANT
RING TRICUSPID T28 (Prosthesis & Implant Heart) ×4 IMPLANT
SEALANT SURG COSEAL 8ML (VASCULAR PRODUCTS) ×4 IMPLANT
SENSOR MYOCARDIAL TEMP (MISCELLANEOUS) ×4 IMPLANT
SET CARDIOPLEGIA MPS 5001102 (MISCELLANEOUS) ×4 IMPLANT
SHEATH AVANTI 11CM 5FR (MISCELLANEOUS) IMPLANT
SPONGE GAUZE 4X4 12PLY STER LF (GAUZE/BANDAGES/DRESSINGS) ×8 IMPLANT
SPONGE LAP 18X18 X RAY DECT (DISPOSABLE) ×16 IMPLANT
STOPCOCK 4 WAY LG BORE MALE ST (IV SETS) ×4 IMPLANT
SUCKER INTRACARDIAC WEIGHTED (SUCKER) ×4 IMPLANT
SUCKER WEIGHTED FLEX (MISCELLANEOUS) ×4 IMPLANT
SURGIFLO W/THROMBIN 8M KIT (HEMOSTASIS) ×4 IMPLANT
SUT ETHIBOND 2 0 SH (SUTURE) ×14 IMPLANT
SUT ETHIBOND 2 0 SH 36X2 (SUTURE) ×10 IMPLANT
SUT ETHIBOND 2 0 V4 (SUTURE) ×8 IMPLANT
SUT ETHIBOND 2 0V4 GREEN (SUTURE) ×12 IMPLANT
SUT ETHIBOND 4 0 TF (SUTURE) IMPLANT
SUT ETHIBOND 5 0 C 1 30 (SUTURE) IMPLANT
SUT ETHIBOND 5 LR DA (SUTURE) ×8 IMPLANT
SUT ETHIBOND NAB MH 2-0 36IN (SUTURE) ×52 IMPLANT
SUT ETHILON 3 0 FSL (SUTURE) ×4 IMPLANT
SUT PROLENE 3 0 RB 1 (SUTURE) IMPLANT
SUT PROLENE 3 0 SH 1 (SUTURE) ×4 IMPLANT
SUT PROLENE 3 0 SH DA (SUTURE) ×8 IMPLANT
SUT PROLENE 4 0 RB 1 (SUTURE) ×18
SUT PROLENE 4 0 SH DA (SUTURE) ×16 IMPLANT
SUT PROLENE 4-0 RB1 .5 CRCL 36 (SUTURE) ×18 IMPLANT
SUT PROLENE 5 0 C1 (SUTURE) IMPLANT
SUT PROLENE 6 0 C 1 30 (SUTURE) ×24 IMPLANT
SUT SILK  1 MH (SUTURE) ×8
SUT SILK 1 MH (SUTURE) ×8 IMPLANT
SUT SILK 1 TIES 10X30 (SUTURE) ×4 IMPLANT
SUT SILK 2 0 SH CR/8 (SUTURE) ×12 IMPLANT
SUT SILK 3 0 SH CR/8 (SUTURE) ×4 IMPLANT
SUT STEEL 6MS V (SUTURE) ×8 IMPLANT
SUT STEEL SZ 6 DBL 3X14 BALL (SUTURE) ×4 IMPLANT
SUT TEM PAC WIRE 2 0 SH (SUTURE) ×8 IMPLANT
SUT VIC AB 1 CTX 18 (SUTURE) ×16 IMPLANT
SUT VIC AB 1 CTX 36 (SUTURE) ×6
SUT VIC AB 1 CTX36XBRD ANBCTR (SUTURE) ×6 IMPLANT
SUT VIC AB 2-0 CTX 27 (SUTURE) ×8 IMPLANT
SUT VIC AB 3-0 SH 8-18 (SUTURE) ×4 IMPLANT
SUT VIC AB 3-0 X1 27 (SUTURE) ×12 IMPLANT
SUT VICRYL 2 TP 1 (SUTURE) ×4 IMPLANT
SYR 50ML LL SCALE MARK (SYRINGE) ×4 IMPLANT
SYSTEM SAHARA CHEST DRAIN ATS (WOUND CARE) ×4 IMPLANT
TAPE CLOTH SURG 4X10 WHT LF (GAUZE/BANDAGES/DRESSINGS) ×8 IMPLANT
TAPE PAPER 2X10 WHT MICROPORE (GAUZE/BANDAGES/DRESSINGS) ×4 IMPLANT
TOWEL OR 17X24 6PK STRL BLUE (TOWEL DISPOSABLE) ×8 IMPLANT
TOWEL OR 17X26 10 PK STRL BLUE (TOWEL DISPOSABLE) ×8 IMPLANT
TRAY CATH LUMEN 1 20CM STRL (SET/KITS/TRAYS/PACK) ×4 IMPLANT
TRAY FOLEY IC TEMP SENS 14FR (CATHETERS) ×4 IMPLANT
TUBE CONNECTING 12'X1/4 (SUCTIONS) ×1
TUBE CONNECTING 12X1/4 (SUCTIONS) ×3 IMPLANT
TUBE SUCTION CARDIAC 10FR (CANNULA) ×4 IMPLANT
UNDERPAD 30X30 (UNDERPADS AND DIAPERS) ×4 IMPLANT
VENT LEFT HEART 12002 (CATHETERS)
WATER STERILE IRR 1000ML POUR (IV SOLUTION) ×8 IMPLANT
YANKAUER SUCT BULB TIP NO VENT (SUCTIONS) ×4 IMPLANT

## 2016-12-13 NOTE — Op Note (Signed)
Trevor, [name]:  [id number]  MEDICAL RECORD NO.:  [id number]  LOCATION:  E38C                         FACILITY:  [facility]  PHYSICIAN:  Ivin Poot, M.D.  DATE OF BIRTH:  1945-12-29  DATE OF PROCEDURE:  12/13/2016 DATE OF DISCHARGE:                              OPERATIVE REPORT   OPERATION: 1. Redo sternotomy. 2. Placement of HeartMate II left ventricular assist device. 3. Tricuspid valve annuloplasty repair using an MC -3  28 mm ring --     serial [id number].  SURGEON:  Ivin Poot, MD.  Trevor MustacheGilford Raid, MD.  ANESTHESIA:  General by Dr. Sherian Maroon.  PREOPERATIVE DIAGNOSIS:  Ischemic cardiomyopathy, advanced heart failure with cardiogenic shock, preoperative intra-aortic balloon pump.  POSTOPERATIVE DIAGNOSIS:  Ischemic cardiomyopathy, advanced heart failure with cardiogenic shock, preoperative intra-aortic balloon pump.  CLINICAL NOTE:  The patient is a 71 year old diabetic male with history of ischemic heart disease, status post CABG in 2010 and TAVR for aortic stenosis in 2015.  He presented to this hospital with evidence of advanced heart failure, class IV, and his evaluation demonstrated cardiogenic shock for which he was placed on inotropes and treated medically in the intensive care unit.  He had acute-on-chronic renal failure, which improved somewhat, but his creatinine still remained elevated above 2.0.  He was evaluated for left ventricular assist device as destination therapy for treatment of his ischemic cardiomyopathy.  It was felt that he would have a little chance of being able to leave the hospital without mechanical assist device placement.  The procedure of LVAD implantation with combined tricuspid valve repair for severe tricuspid regurgitation was discussed with the patient.  I discussed the indications, benefits, alternatives, and risks of surgery.  He understood that this operation would both improve  his quality of life as well as extend his survival.  He understood the risks of bleeding, blood transfusion, postoperative infection, stroke, renal failure, infection, and death.  After reviewing these issues on more than one occasion, he demonstrated his understanding and agreed to proceed with surgery under what I felt was an informed consent.  OPERATIVE FINDINGS: 1. 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 2. Successful tricuspid valve ring annuloplasty and successful     implantation of HeartMate II left ventricular assist device.  OPERATIVE PROCEDURE:  The patient was brought to the operating room, placed supine on the operating table.  General anesthesia was induced under invasive hemodynamic monitoring.  A transesophageal echo probe was placed by the anesthesiologist.  The patient was prepped and draped as a sterile field.  A proper time-out was performed.  A redo sternal incision and sternotomy were performed with care being taken to avoid injury to the underlying vascular structures.  The anterior mediastinum was dissected from dense adhesions.  This was time-consuming and tedious.  The ascending aorta, right atrium, right ventricle, left mammary artery pedicle, and a patent vein graft to the diagonal and an occluded vein graft to the distal right were all identified and protected.  An incision was made in the abdomen for a driveline tract and  exit site.  Heparin was administered and pursestrings were placed in descending aorta and superior vena cava.  When the ACT was documented as being therapeutic, the patient was cannulated and placed on bypass with a single cannula.  A second pursestring was placed at the IVC for bicaval drainage and bicaval tapes were applied.  The patient was cooled slightly to 32 degrees.  While empty beating and after tightening the caval tapes, the right  atrium was opened and the tricuspid valve was exposed.  There was no structural damage to the valve, but was a functional regurgitation from a dilated anulus.  The anterior leaflet was sized to a 28-French Sizer.  The AICD lead and Swan catheter were identified and protected.  Ten sutures of 2-0 Ethibond were placed around the anulus and the opened-ended MC3 ring was then seated through the sutures tied and the valve was tested with saline and had no regurgitation.  The atriotomy was closed with a double layer of running 4-0 Prolene.  Next, the ascending aorta was partially clamped and the outflow graft was sewn end-to-side with a running 4-0 Prolene using standard techniques.  A vascular clamp was placed on the graft and the graft was gently placed cephalad away from the field.  Next, the heart which had been dissected completely posteriorly was lifted into the field and placed on several warm lap pads.  The LV apex was identified.  An 11 blade scalpel followed by Foley balloon was placed into the LV apex.  The coring knife was used to remove a core of LV myocardium in proper orientation.  The coring knife was removed and the muscle trabecula were excised.  The 2-0 Ethibond sutures were placed with pledgets in a mattress configuration around the apex, numbering 14 total.  These were then placed through the Dacron sewing cuff on the side elastic sleeve and the sutures were carefully placed and secured. There was no evidence of abnormal space in between the sutures.  The pump was then brought to the field and while the heart was filled to volume, the inflow cannula was placed into the apical side elastic sleeve and secured with several ties and a tie band.  Then, the driveline was pulled through the abdominal tract and exited the right upper quadrant.  Next, the pump was connected to the outflow graft with care being to avoid air entry entering into the circulation.  A 20-gauge needle  was placed in the graft as the pump was turned on after the controller was connected to the pump.  The pump was placed in the pocket and the ventilator was resumed.  The temporary pacing wires were applied.  The ventilator was placed at full support with inhaled nitric oxide and low- dose inotropes were started.  We then transitioned off cardiopulmonary bypass to full LVAD support with the speed gradually increased from 6000 up to 8400 and 8600.  Echo showed the inflow cannula to be good position and the septum was straight.  The cardiac output was 5 L.  LVAD parameters were satisfactory off bypass.  Protamine was slowly and carefully administered and the patient remained stable.  The venous and aortic cannulas were removed.  Heparin was reversed, but there was still considerable coagulopathy.  The patient had a platelet count returned at 65,000.  The patient was then given blood components including platelets and FFP.  We spent a considerable amount of time drying up the operative field.  Bilateral pleural tubes, anterior mediastinal, and a  pump pocket drain were all brought out through separate incisions.  The superior pericardial fat was closed over the aorta.  The mediastinum was irrigated with saline.  The sternum was closed with interrupted steel wire.  The pectoralis fascia was closed with interrupted #1 Vicryl.  The subcutaneous and skin layers were closed in running Vicryl.  The counter incision in the abdomen was closed with Vicryl and the exit site was closed with a nylon skin suture.  Next, a double-lumen left subclavian catheter was placed for IV access before the drapes were taken off.  The patient was then x-rayed and after the x-ray was reviewed and was satisfactory, the patient was transferred back to the ICU.  Total cardiopulmonary bypass time was 160 minutes.     Ivin Poot, M.D.     PV/MEDQ  D:  12/13/2016  T:  12/13/2016  Job:  [id number]

## 2016-12-13 NOTE — Anesthesia Procedure Notes (Addendum)
Central Venous Catheter Insertion Performed by: Suzette Battiest, anesthesiologist Start/End2/07/2017 8:05 AM, 12/13/2016 8:15 AM Patient location: Pre-op. Preanesthetic checklist: patient identified, IV checked, site marked, risks and benefits discussed, surgical consent, monitors and equipment checked, pre-op evaluation, timeout performed and anesthesia consent Lidocaine 1% used for infiltration and patient sedated Hand hygiene performed  and maximum sterile barriers used  Catheter size: 9 Fr MAC introducer PA Cath depth:55 Procedure performed using ultrasound guided technique. Ultrasound Notes:anatomy identified, needle tip was noted to be adjacent to the nerve/plexus identified, no ultrasound evidence of intravascular and/or intraneural injection and image(s) printed for medical record Attempts: 1 Following insertion, line sutured and dressing applied. Post procedure assessment: blood return through all ports, free fluid flow and no air  Patient tolerated the procedure well with no immediate complications.

## 2016-12-13 NOTE — Progress Notes (Signed)
The patient was examined and preop studies reviewed. There has been no change from the prior exam and the patient is ready for surgery.  plan placement of LVAD and tricuspid valve repair on Trevor [name]

## 2016-12-13 NOTE — OR Nursing (Signed)
[place] call made to SICU.

## 2016-12-13 NOTE — Anesthesia Procedure Notes (Signed)
Procedure Name: Intubation Date/Time: 12/13/2016 7:57 AM Performed by: Clearnce Sorrel Pre-anesthesia Checklist: Patient identified, Emergency Drugs available, Patient being monitored, Suction available and Timeout performed Patient Re-evaluated:Patient Re-evaluated prior to inductionOxygen Delivery Method: Circle system utilized Preoxygenation: Pre-oxygenation with 100% oxygen Intubation Type: IV induction Ventilation: Two handed mask ventilation required and Oral airway inserted - appropriate to patient size Laryngoscope Size: Mac and 3 Grade View: Grade III Tube type: Subglottic suction tube Tube size: 8.0 mm Number of attempts: 2 (DL times one by Darlen Round unable to view cords, DL times one by C. Maness grade 3 view) Airway Equipment and Method: Stylet Placement Confirmation: ETT inserted through vocal cords under direct vision,  positive ETCO2 and breath sounds checked- equal and bilateral Secured at: 8 cm Tube secured with: Tape Dental Injury: Teeth and Oropharynx as per pre-operative assessment

## 2016-12-13 NOTE — Anesthesia Postprocedure Evaluation (Signed)
Anesthesia Post Note  Patient: Clayborn Heyse  Procedure(s) Performed: Procedure(s) (LRB): INSERTION OF IMPLANTABLE LEFT VENTRICULAR ASSIST DEVICE (N/A) TRICUSPID VALVE REPAIR WITH EDWARDS MC 3 TRICUSPID ANNULOPLASTY RING MODEL 4900 SIZE T 28 (N/A) TRANSESOPHAGEAL ECHOCARDIOGRAM (TEE) (N/A)  Patient location during evaluation: SICU Anesthesia Type: General Level of consciousness: sedated Pain management: pain level controlled Vital Signs Assessment: post-procedure vital signs reviewed and stable Respiratory status: patient remains intubated per anesthesia plan Cardiovascular status: stable Anesthetic complications: no       Last Vitals:  Vitals:   12/13/16 1718 12/13/16 1719  BP:    Pulse: (!) 33 (!) 30  Resp: 12 16  Temp: 36.9 C 36.9 C    Last Pain:  Vitals:   12/13/16 0400  TempSrc: Core (Comment)  PainSc:                  Tiajuana Amass

## 2016-12-13 NOTE — OR Nursing (Signed)
1510 First call made to SICU.

## 2016-12-13 NOTE — Progress Notes (Signed)
CSW met with patient's sister Trevor [name] in the waiting area. She shared that she and her sister Trevor [name] were able to see patient this morning before he left for surgery. She noted that surgeon was with patient and how impressed she was that he escorted patient from room to OR. Sister denies any concerns at this time and verbalizes understanding of today's schedule and plan. CSW will continue to follow and provide supportive intervention to patient and family throughout implant hospitalization. Raquel Sarna, [place], [place]

## 2016-12-13 NOTE — Progress Notes (Signed)
Updated [name] of patient's progress in the OR. Currently in the waiting room. Explained he is off CPB and fully on LVAD support. Will go out to speak with them on behalf of Dr. Prescott Gum when ICU gets 45 min notice.   [name], RN VAD Coordinator   Office: [phone] 24/7 Emergency VAD Pager: [phone]

## 2016-12-13 NOTE — Transfer of Care (Signed)
Immediate Anesthesia Transfer of Care Note  Patient: Kylin Redhead  Procedure(s) Performed: Procedure(s) with comments: INSERTION OF IMPLANTABLE LEFT VENTRICULAR ASSIST DEVICE (N/A) - HEARTMATE II  NITRIC OXIDE TRICUSPID VALVE REPAIR WITH EDWARDS MC 3 TRICUSPID ANNULOPLASTY RING MODEL 4900 SIZE T 28 (N/A) TRANSESOPHAGEAL ECHOCARDIOGRAM (TEE) (N/A)  Patient Location: SICU  Anesthesia Type:General  Level of Consciousness: Patient remains intubated per anesthesia plan  Airway & Oxygen Therapy: Patient remains intubated per anesthesia plan  Post-op Assessment: Report given to RN and Post -op Vital signs reviewed and stable  Post vital signs: Reviewed and stable  Last Vitals:  Vitals:   12/13/16 0700 12/13/16 1710  BP: 108/77 102/88  Pulse: (!) 59 92  Resp: 12 12  Temp: 36.8 C     Last Pain:  Vitals:   12/13/16 0400  TempSrc: Core (Comment)  PainSc:       Patients Stated Pain Goal: 0 ([id number])  Complications: No apparent anesthesia complications

## 2016-12-13 NOTE — OR Nursing (Signed)
1701 Rolling call made to SICU.

## 2016-12-13 NOTE — Progress Notes (Signed)
Pharmacy Antibiotic Note  Trevor [name] is a 71 y.o. male admitted on 11/27/2016 with chest pain and leg swellingm  Acute on chronic systolic CHF.  Now s/p LVAD placement and tricuspid valve repair.   Pharmacy has been consulted for Vancomycin dosing for surgical prophylaxis for LVAD Post-Op: for up to 48 hours post AET. preop vanc 1.5 g IV x1 given 2/9 @ 0830 SCr 1.3 (down from 2.28 on 2/4)  AET 12/13/16 17:28    Plan: Vancomycin 750 mg IV q12h x 48 hrs Monitor renal function and adjust dose if needed.   Height: 5\' 5"  (165.1 cm) Weight: 181 lb 10.5 oz (82.4 kg) IBW/kg (Calculated) : 61.5  Temp (24hrs), Avg:98.5 F (36.9 C), Min:97.9 F (36.6 C), Max:99 F (37.2 C)   Recent Labs Lab 12/10/16 0445  12/11/16 0305 12/12/16 0351 12/12/16 0918 12/13/16 0405  12/13/16 1127 12/13/16 1153 12/13/16 1241 12/13/16 1321 12/13/16 1438  WBC 8.1  --  8.1 9.8 10.0 11.2*  --   --   --   --   --   --   CREATININE 1.91*  < > 2.08* 1.71*  --  1.56*  < > 1.20 1.30* 1.30* 1.30* 1.30*  < > = values in this interval not displayed.  Estimated Creatinine Clearance: 51.5 mL/min (by C-G formula based on SCr of 1.3 mg/dL (H)).    Allergies  Allergen Reactions  . No Known Allergies     Antimicrobials this admission: 2/9 Vancomycin >>  (2/11) for 48hr therapy *  Thank you for allowing pharmacy to be a part of this patient's care. Nicole Cella, RPh Clinical Pharmacist [phone]  [phone] 12/13/2016 5:40 PM

## 2016-12-13 NOTE — Progress Notes (Signed)
  Echocardiogram Echocardiogram Transesophageal has been performed.  Bobbye Charleston 12/13/2016, 9:18 AM

## 2016-12-13 NOTE — Progress Notes (Signed)
Transported to 2S10 with OR team on vent and Nitric without complications.

## 2016-12-13 NOTE — Anesthesia Procedure Notes (Signed)
Central Venous Catheter Insertion Performed by: Suzette Battiest, anesthesiologist Patient location: Pre-op. Preanesthetic checklist: patient identified, IV checked, site marked, risks and benefits discussed, surgical consent, monitors and equipment checked, pre-op evaluation, timeout performed and anesthesia consent Hand hygiene performed  and maximum sterile barriers used  PA cath was placed.Swan type:thermodilution PA Cath depth:55 Procedure performed using ultrasound guided technique. Ultrasound Notes:anatomy identified, needle tip was noted to be adjacent to the nerve/plexus identified, no ultrasound evidence of intravascular and/or intraneural injection and image(s) printed for medical record Attempts: 1 Patient tolerated the procedure well with no immediate complications.

## 2016-12-13 NOTE — Brief Op Note (Signed)
11/27/2016 - 12/13/2016  5:21 PM  PATIENT:  Trevor [name]  71 y.o. male  PRE-OPERATIVE DIAGNOSIS:  HEART FAILURE  POST-OPERATIVE DIAGNOSIS:  HEART FAILURE  PROCEDURE:  Procedure(s) with comments: INSERTION OF IMPLANTABLE LEFT VENTRICULAR ASSIST DEVICE (N/A) - HEARTMATE II  NITRIC OXIDE TRICUSPID VALVE REPAIR WITH EDWARDS MC 3 TRICUSPID ANNULOPLASTY RING MODEL 4900 SIZE T 28 (N/A) TRANSESOPHAGEAL ECHOCARDIOGRAM (TEE) (N/A)  SURGEON:  Surgeon(s) and Role:    * Ivin Poot, MD - Primary    * Gaye Pollack, MD - Assisting  PHYSICIAN ASSISTANT:   ASSISTANTS: Gilford Raid MD co-surgeon   ANESTHESIA:   general, Rodman Comp MD  EBL:  Total I/O In: [id number] [I.V.:2200; Blood:3055] Out: 2275 [Urine:520; Blood:1755]  BLOOD ADMINISTERED:2 units CC PRBC 4 U FFP  DRAINS  2 MTs  2 pleural tubes  LOCAL MEDICATIONS USED:  NONE  SPECIMEN:  Excision  DISPOSITION OF SPECIMEN:  PATHOLOGY  COUNTS:  YES  TOURNIQUET:  * No tourniquets in log *  DICTATION: .Dragon Dictation  PLAN OF CARE: tranfer to 2S  room10  PATIENT DISPOSITION:  ICU - intubated and hemodynamically stable.   Delay start of Pharmacological VTE agent (>24hrs) due to surgical blood loss or risk of bleeding: yes

## 2016-12-14 ENCOUNTER — Inpatient Hospital Stay (HOSPITAL_COMMUNITY): Payer: Medicare Other

## 2016-12-14 DIAGNOSIS — Z95811 Presence of heart assist device: Secondary | ICD-10-CM

## 2016-12-14 LAB — PREPARE FRESH FROZEN PLASMA
Blood Product Expiration Date: 201802142359
Blood Product Expiration Date: 201802142359
ISSUE DATE / TIME: 201802092342
ISSUE DATE / TIME: 201802092342
UNIT DIVISION: 0
UNIT DIVISION: 0
Unit Type and Rh: 6200
Unit Type and Rh: 6200
Unit division: 0
Unit division: 0
Unit division: 0

## 2016-12-14 LAB — POCT I-STAT, CHEM 8
BUN: 32 mg/dL — ABNORMAL HIGH (ref 6–20)
CREATININE: 1.9 mg/dL — AB (ref 0.61–1.24)
Calcium, Ion: 1.14 mmol/L — ABNORMAL LOW (ref 1.15–1.40)
Chloride: 95 mmol/L — ABNORMAL LOW (ref 101–111)
GLUCOSE: 133 mg/dL — AB (ref 65–99)
HCT: 31 % — ABNORMAL LOW (ref 39.0–52.0)
HEMOGLOBIN: 10.5 g/dL — AB (ref 13.0–17.0)
POTASSIUM: 4.2 mmol/L (ref 3.5–5.1)
Sodium: 132 mmol/L — ABNORMAL LOW (ref 135–145)
TCO2: 27 mmol/L (ref 0–100)

## 2016-12-14 LAB — PREPARE CRYOPRECIPITATE
BLOOD PRODUCT EXPIRATION DATE: 201802091858
Blood Product Expiration Date: 201802091858
ISSUE DATE / TIME: 201802091328
ISSUE DATE / TIME: 201802091328
UNIT TYPE AND RH: 6200
Unit Type and Rh: 6200

## 2016-12-14 LAB — CBC
HCT: 21.4 % — ABNORMAL LOW (ref 39.0–52.0)
Hemoglobin: 7.3 g/dL — ABNORMAL LOW (ref 13.0–17.0)
MCH: 30.5 pg (ref 26.0–34.0)
MCHC: 34.1 g/dL (ref 30.0–36.0)
MCV: 89.5 fL (ref 78.0–100.0)
Platelets: 93 10*3/uL — ABNORMAL LOW (ref 150–400)
RBC: 2.39 MIL/uL — ABNORMAL LOW (ref 4.22–5.81)
RDW: 16.8 % — ABNORMAL HIGH (ref 11.5–15.5)
WBC: 13.7 10*3/uL — ABNORMAL HIGH (ref 4.0–10.5)

## 2016-12-14 LAB — CBC WITH DIFFERENTIAL/PLATELET
Basophils Absolute: 0 10*3/uL (ref 0.0–0.1)
Basophils Relative: 0 %
Eosinophils Absolute: 0.2 10*3/uL (ref 0.0–0.7)
Eosinophils Relative: 2 %
HCT: 22.9 % — ABNORMAL LOW (ref 39.0–52.0)
Hemoglobin: 7.7 g/dL — ABNORMAL LOW (ref 13.0–17.0)
Lymphocytes Relative: 6 %
Lymphs Abs: 0.8 10*3/uL (ref 0.7–4.0)
MCH: 31 pg (ref 26.0–34.0)
MCHC: 33.6 g/dL (ref 30.0–36.0)
MCV: 92.3 fL (ref 78.0–100.0)
Monocytes Absolute: 2.1 10*3/uL — ABNORMAL HIGH (ref 0.1–1.0)
Monocytes Relative: 15 %
Neutro Abs: 11.2 10*3/uL — ABNORMAL HIGH (ref 1.7–7.7)
Neutrophils Relative %: 78 %
Platelets: 114 10*3/uL — ABNORMAL LOW (ref 150–400)
RBC: 2.48 MIL/uL — ABNORMAL LOW (ref 4.22–5.81)
RDW: 17.1 % — ABNORMAL HIGH (ref 11.5–15.5)
WBC: 14.4 10*3/uL — ABNORMAL HIGH (ref 4.0–10.5)

## 2016-12-14 LAB — PREPARE PLATELET PHERESIS
BLOOD PRODUCT EXPIRATION DATE: 201802092359
Blood Product Expiration Date: 201802112359
Blood Product Expiration Date: 201802112359
ISSUE DATE / TIME: 201802091313
ISSUE DATE / TIME: 201802091313
ISSUE DATE / TIME: 201802091814
UNIT TYPE AND RH: 6200
Unit Type and Rh: 2800
Unit Type and Rh: 5100

## 2016-12-14 LAB — POCT I-STAT 3, ART BLOOD GAS (G3+)
ACID-BASE EXCESS: 2 mmol/L (ref 0.0–2.0)
Acid-Base Excess: 4 mmol/L — ABNORMAL HIGH (ref 0.0–2.0)
BICARBONATE: 28.6 mmol/L — AB (ref 20.0–28.0)
BICARBONATE: 26.6 mmol/L (ref 20.0–28.0)
O2 Saturation: 99 %
O2 Saturation: 95 %
PH ART: 7.429 (ref 7.350–7.450)
PO2 ART: 134 mmHg — AB (ref 83.0–108.0)
Patient temperature: 36.3
TCO2: 30 mmol/L (ref 0–100)
TCO2: 28 mmol/L (ref 0–100)
pCO2 arterial: 42.9 mmHg (ref 32.0–48.0)
pCO2 arterial: 41.4 mmHg (ref 32.0–48.0)
pH, Arterial: 7.418 (ref 7.350–7.450)
pO2, Arterial: 75 mmHg — ABNORMAL LOW (ref 83.0–108.0)

## 2016-12-14 LAB — COMPREHENSIVE METABOLIC PANEL
ALT: 24 U/L (ref 17–63)
AST: 83 U/L — ABNORMAL HIGH (ref 15–41)
Albumin: 3.4 g/dL — ABNORMAL LOW (ref 3.5–5.0)
Alkaline Phosphatase: 45 U/L (ref 38–126)
Anion gap: 11 (ref 5–15)
BUN: 32 mg/dL — ABNORMAL HIGH (ref 6–20)
CO2: 26 mmol/L (ref 22–32)
Calcium: 8.3 mg/dL — ABNORMAL LOW (ref 8.9–10.3)
Chloride: 95 mmol/L — ABNORMAL LOW (ref 101–111)
Creatinine, Ser: 1.57 mg/dL — ABNORMAL HIGH (ref 0.61–1.24)
GFR calc Af Amer: 49 mL/min — ABNORMAL LOW (ref >60)
GFR calc non Af Amer: 43 mL/min — ABNORMAL LOW (ref >60)
Glucose, Bld: 121 mg/dL — ABNORMAL HIGH (ref 65–99)
Potassium: 4.2 mmol/L (ref 3.5–5.1)
Sodium: 132 mmol/L — ABNORMAL LOW (ref 135–145)
Total Bilirubin: 5 mg/dL — ABNORMAL HIGH (ref 0.3–1.2)
Total Protein: 5.2 g/dL — ABNORMAL LOW (ref 6.5–8.1)

## 2016-12-14 LAB — BASIC METABOLIC PANEL
Anion gap: 11 (ref 5–15)
BUN: 33 mg/dL — ABNORMAL HIGH (ref 6–20)
CO2: 25 mmol/L (ref 22–32)
Calcium: 8.2 mg/dL — ABNORMAL LOW (ref 8.9–10.3)
Chloride: 98 mmol/L — ABNORMAL LOW (ref 101–111)
Creatinine, Ser: 1.77 mg/dL — ABNORMAL HIGH (ref 0.61–1.24)
GFR calc Af Amer: 43 mL/min — ABNORMAL LOW (ref >60)
GFR calc non Af Amer: 37 mL/min — ABNORMAL LOW (ref >60)
Glucose, Bld: 128 mg/dL — ABNORMAL HIGH (ref 65–99)
Potassium: 4.4 mmol/L (ref 3.5–5.1)
Sodium: 134 mmol/L — ABNORMAL LOW (ref 135–145)

## 2016-12-14 LAB — GLUCOSE, CAPILLARY
GLUCOSE-CAPILLARY: 104 mg/dL — AB (ref 65–99)
GLUCOSE-CAPILLARY: 107 mg/dL — AB (ref 65–99)
GLUCOSE-CAPILLARY: 120 mg/dL — AB (ref 65–99)
GLUCOSE-CAPILLARY: 121 mg/dL — AB (ref 65–99)
GLUCOSE-CAPILLARY: 135 mg/dL — AB (ref 65–99)
GLUCOSE-CAPILLARY: 138 mg/dL — AB (ref 65–99)
GLUCOSE-CAPILLARY: 142 mg/dL — AB (ref 65–99)
GLUCOSE-CAPILLARY: 97 mg/dL (ref 65–99)
Glucose-Capillary: 129 mg/dL — ABNORMAL HIGH (ref 65–99)
Glucose-Capillary: 148 mg/dL — ABNORMAL HIGH (ref 65–99)
Glucose-Capillary: 135 mg/dL — ABNORMAL HIGH (ref 65–99)
Glucose-Capillary: 107 mg/dL — ABNORMAL HIGH (ref 65–99)
Glucose-Capillary: 129 mg/dL — ABNORMAL HIGH (ref 65–99)
Glucose-Capillary: 105 mg/dL — ABNORMAL HIGH (ref 65–99)
Glucose-Capillary: 160 mg/dL — ABNORMAL HIGH (ref 65–99)

## 2016-12-14 LAB — PHOSPHORUS: Phosphorus: 3.5 mg/dL (ref 2.5–4.6)

## 2016-12-14 LAB — MAGNESIUM
Magnesium: 2.7 mg/dL — ABNORMAL HIGH (ref 1.7–2.4)
Magnesium: 2.3 mg/dL (ref 1.7–2.4)

## 2016-12-14 LAB — PROTIME-INR
INR: 1.04
Prothrombin Time: 13.6 seconds (ref 11.4–15.2)

## 2016-12-14 LAB — PREPARE RBC (CROSSMATCH)

## 2016-12-14 LAB — CALCIUM, IONIZED: Calcium, Ionized, Serum: 4.5 mg/dL (ref 4.5–5.6)

## 2016-12-14 LAB — COOXEMETRY PANEL
Carboxyhemoglobin: 2.4 % — ABNORMAL HIGH (ref 0.5–1.5)
Methemoglobin: 2 % — ABNORMAL HIGH (ref 0.0–1.5)
O2 Saturation: 71.1 %
Total hemoglobin: 7.6 g/dL — ABNORMAL LOW (ref 12.0–16.0)

## 2016-12-14 LAB — POCT ACTIVATED CLOTTING TIME: Activated Clotting Time: 131 seconds

## 2016-12-14 LAB — LACTATE DEHYDROGENASE: LDH: 299 U/L — ABNORMAL HIGH (ref 98–192)

## 2016-12-14 LAB — BRAIN NATRIURETIC PEPTIDE: B Natriuretic Peptide: 427.7 pg/mL — ABNORMAL HIGH (ref 0.0–100.0)

## 2016-12-14 MED ORDER — SODIUM CHLORIDE 0.9 % IV SOLN
0.0300 [IU]/min | INTRAVENOUS | Status: DC
  Administered 2016-12-14: 0.03 [IU]/min via INTRAVENOUS
  Filled 2016-12-14 (×2): qty 2

## 2016-12-14 MED ORDER — VANCOMYCIN HCL IN DEXTROSE 750-5 MG/150ML-% IV SOLN: 750.0000 mg | INTRAVENOUS | Status: DC

## 2016-12-14 MED ORDER — SODIUM CHLORIDE 0.9 % IV SOLN
1250.0000 mg | INTRAVENOUS | Status: AC
  Administered 2016-12-15 – 2016-12-16 (×2): 1250 mg via INTRAVENOUS
  Filled 2016-12-14 (×2): qty 1250

## 2016-12-14 MED ORDER — FUROSEMIDE 10 MG/ML IJ SOLN
8.0000 mg/h | INTRAVENOUS | Status: DC
  Administered 2016-12-14: 8 mg/h via INTRAVENOUS
  Filled 2016-12-14 (×3): qty 25

## 2016-12-14 MED ORDER — INSULIN ASPART 100 UNIT/ML ~~LOC~~ SOLN
0.0000 [IU] | SUBCUTANEOUS | Status: DC
  Administered 2016-12-14: 2 [IU] via SUBCUTANEOUS
  Administered 2016-12-14 – 2016-12-15 (×3): 4 [IU] via SUBCUTANEOUS
  Administered 2016-12-15 (×2): 2 [IU] via SUBCUTANEOUS
  Administered 2016-12-15 (×2): 4 [IU] via SUBCUTANEOUS
  Administered 2016-12-16: 8 [IU] via SUBCUTANEOUS
  Administered 2016-12-16: 4 [IU] via SUBCUTANEOUS
  Administered 2016-12-17: 2 [IU] via SUBCUTANEOUS
  Administered 2016-12-17: 4 [IU] via SUBCUTANEOUS
  Administered 2016-12-17 – 2016-12-18 (×2): 2 [IU] via SUBCUTANEOUS
  Administered 2016-12-18: 4 [IU] via SUBCUTANEOUS
  Administered 2016-12-18 – 2016-12-19 (×3): 2 [IU] via SUBCUTANEOUS

## 2016-12-14 MED ORDER — PANTOPRAZOLE SODIUM 40 MG IV SOLR
40.0000 mg | Freq: Every day | INTRAVENOUS | Status: DC
  Administered 2016-12-14 – 2016-12-15 (×2): 40 mg via INTRAVENOUS
  Filled 2016-12-14 (×2): qty 40

## 2016-12-14 MED ORDER — FUROSEMIDE 10 MG/ML IJ SOLN
40.0000 mg | Freq: Once | INTRAMUSCULAR | Status: AC
  Administered 2016-12-14: 40 mg via INTRAVENOUS
  Filled 2016-12-14: qty 4

## 2016-12-14 MED ORDER — PANTOPRAZOLE SODIUM 40 MG IV SOLR: 40.0000 mg | INTRAVENOUS | Status: DC

## 2016-12-14 MED ORDER — ALBUMIN HUMAN 5 % IV SOLN
250.0000 mL | INTRAVENOUS | Status: AC | PRN
  Administered 2016-12-14 – 2016-12-16 (×4): 250 mL via INTRAVENOUS
  Filled 2016-12-14 (×5): qty 250

## 2016-12-14 MED ORDER — NOREPINEPHRINE BITARTRATE 1 MG/ML IV SOLN
0.0000 ug/min | INTRAVENOUS | Status: DC
  Administered 2016-12-14 – 2016-12-15 (×2): 10 ug/min via INTRAVENOUS
  Administered 2016-12-16: 3.5 ug/min via INTRAVENOUS
  Administered 2016-12-16: 5 ug/min via INTRAVENOUS
  Filled 2016-12-14 (×5): qty 4

## 2016-12-14 MED ORDER — INSULIN DETEMIR 100 UNIT/ML ~~LOC~~ SOLN
12.0000 [IU] | Freq: Every day | SUBCUTANEOUS | Status: DC
  Administered 2016-12-14 – 2016-12-15 (×2): 12 [IU] via SUBCUTANEOUS
  Filled 2016-12-14 (×2): qty 0.12

## 2016-12-14 MED ORDER — SODIUM CHLORIDE 0.9 % IV SOLN: Freq: Once | INTRAVENOUS | Status: DC

## 2016-12-14 NOTE — Plan of Care (Signed)
Problem: Fluid Volume: Goal: Risk for excess fluid volume will decrease Outcome: Progressing CVP 8-11 this shift, IV Lasix 40mg  given x1 per PVT  Problem: Respiratory: Goal: Ability to maintain adequate ventilation will improve Outcome: Progressing Nitric oxide weanted from 30 to 10PPM with no decline in oxygenation status

## 2016-12-14 NOTE — Progress Notes (Signed)
PT Cancellation Note  Patient Details Name: Trevor [name] MRN: [id number] DOB: [birth year]   Cancelled Treatment:    Reason Eval/Treat Not Completed: Patient not medically ready   Duncan Dull 12/14/2016, 6:50 AM Alben Deeds, PT DPT  [phone]

## 2016-12-14 NOTE — Significant Event (Signed)
Notified [employer] to ask about status of PPM/ICD after seeing last interrogation note that PPM/ID was turned off prior to surgery and night RN reported hearing an alarm from it during the night. I spoke with representative, Thayer Headings, who informed me that the PPM/ICD is turned off and is normally turned back on a few days prior to discharge.   Trevor [name]

## 2016-12-14 NOTE — Progress Notes (Signed)
Per Dr. Prescott Gum, give one additional unit of PRBCs, give one dose of PRN tylenol for fever of 100.4, and start weaning Nitric Oxide 1ppm every hour if tolerated well. If able to wean Nitric down to 5, do not wean any further for the night. RT made aware of Nitric Oxide plans. Will place orders and continue to monitor.

## 2016-12-14 NOTE — Progress Notes (Signed)
Updated Dr. Prescott Gum on current vital signs, lab values, drip rates, chest tube outputs, urine output, and overall patient status. Orders received for 2 units PRBC, 2 FFP, and Factor 7. Will continue to closely monitor. Richarda Blade RN

## 2016-12-14 NOTE — Progress Notes (Signed)
Dr. Prescott Gum made aware of patients MAP continuing to drop, maintaining between 65-68 despite increase in Levophed to 15. Frequent ectopy noted as well. CVP, PA pressures, and VAD numbers unchanged. UOP about 500cc since 40mg  IV Lasix at noon. Pacer turned to VVI backup of 60 per PVTs request and vasopressin to be started. Continuing to closely monitor patient at this time.  Trevor [name]

## 2016-12-14 NOTE — Progress Notes (Signed)
Pharmacy Antibiotic Note  Trevor [name] is a 71 y.o. male admitted on 11/27/2016 with chest pain and leg swelling  Acute on chronic systolic CHF.  Now s/p LVAD placement and tricuspid valve repair.   Pharmacy has been consulted for Vancomycin dosing for surgical prophylaxis for LVAD Post-Op: for up to 48 hours post - plan last dose 2/12 am preop vanc 1.5 g IV x1 given 2/9 @ 0830 Post op Vanc 750mg  q12 given last pm and this am - Cr bump today with hypotension 1.5>1.77 Will adjust vancomycin 1250mg  q24 x2 doses - next dose  in am     Plan: Vancomycin1250mg  iv q24 x2 - reassess in am if change if renal function changes Monitor renal function and adjust dose if needed.   Height: 5\' 3"  (160 cm) Weight: 197 lb 12 oz (89.7 kg) (controller, 1 pillow in bed ) IBW/kg (Calculated) : 56.9  Temp (24hrs), Avg:97.8 F (36.6 C), Min:96.6 F (35.9 C), Max:99.9 F (37.7 C)   Recent Labs Lab 12/13/16 0405  12/13/16 1738  12/13/16 2149 12/13/16 2153 12/14/16 0346 12/14/16 1630 12/14/16 1635  WBC 11.2*  --  20.7*  --  19.1*  --  14.4* 13.7*  --   CREATININE 1.56*  < >  --   < > 1.65* 1.60* 1.57* 1.77* 1.90*  < > = values in this interval not displayed.  Estimated Creatinine Clearance: 35.3 mL/min (by C-G formula based on SCr of 1.9 mg/dL (H)).    Allergies  Allergen Reactions  . No Known Allergies     Antimicrobials this admission: 2/9 Vancomycin >>  (2/11) for 48hr therapy *  [name] Pharm.D. CPP, BCPS Clinical Pharmacist [phone] 12/14/2016 5:21 PM

## 2016-12-14 NOTE — Progress Notes (Addendum)
IABP in right femoral artery. Right leg immobilizer removed for access to IABP lines. Unable to aspirated aterial line port. IABP removed, manual pressure applied for 30 minutes. Groin level 0, tegaderm dressing applied. Patient sedated and intubated. Bilateral dp pulses palpable. Bilateral pt pulses present with doppler.  Leg immobilizer reapplied to right leg. Bedrest begins at 10:00:00 am

## 2016-12-14 NOTE — Progress Notes (Signed)
Palliative:  Mr. Cayabyab still on bed rest. He is anticipating his surgery and ready to begin his recovery. No concerns today. He still appears to be coping well. Will continue to follow post op for support.    No charge  Vinie Sill, NP Palliative Medicine Team Pager # [phone] (M-F 8a-5p) Team Phone # [phone] (Nights/Weekends)

## 2016-12-14 NOTE — Progress Notes (Signed)
Nutrition Follow Up  DOCUMENTATION CODES:   Obesity unspecified  INTERVENTION:    If TF warranted, rec Vital High Protein at goal rate of 40 ml/h (960 ml per day) and Prostat 30 ml TID to provide 1260 kcals, 129 gm protein, 803 ml free water daily.  NEW NUTRITION DIAGNOSIS:   Inadequate oral intake related to inability to eat as evidenced by NPO status, ongoing   NEW GOAL:   Provide needs based on ASPEN/SCCM guidelines, currently unmet  MONITOR:   Vent status, Diet advancement, Labs, Weight trends, I & O's  ASSESSMENT:   71 y.o. male with medical history significant of CHF, CKD, DM2, asthma. Patient presents to the ED with c/o worsening leg edema and SOB. Admitted with marked volume overload and low output HF. LVAD workup ongoing.  Pt s/p procedures 2/9: INSERTION OF IMPLANTABLE LEFT VENTRICULAR ASSIST DEVICE (N/A) - HEARTMATE II  Patient is currently intubated on ventilator support >> OGT in place MV: 8.1 L/min Temp (24hrs), Avg:97.4 F (36.3 C), Min:96.6 F (35.9 C), Max:98.4 F (36.9 C)  Prior to surgery, pt was consuming 75-100% on HH/Carbohydrate Modified diet. Also receiving Glucerna Shake oral nutrition supplements. Labs and medications reviewed. CBG's 107-129-120.  Diet Order:  Diet NPO time specified  Skin:  Reviewed, no issues  Last BM:  2/7  Height:   Ht Readings from Last 1 Encounters:  12/13/16 5\' 3"  (1.6 m)    Weight:   Wt Readings from Last 1 Encounters:  12/14/16 197 lb 12 oz (89.7 kg)    Ideal Body Weight:  61.8 kg  BMI:  Body mass index is 35.03 kg/m.  Estimated Nutritional Needs:   Kcal:  [phone]  Protein:  125-135 gm  Fluid:  per MD  EDUCATION NEEDS:   No education needs identified at this time  Arthur Holms, RD, LDN Pager #: [phone] After-Hours Pager #: [phone]

## 2016-12-14 NOTE — Progress Notes (Signed)
Advanced Heart Failure Rounding Note   Subjective:    Admitted with marked volume overload and low output HF.  Echo EF 20-25. Failed dual inotropes. IABP placed 2/7.   Underwent HM-II placement and TVR on 2/9  POD #1 today  Remains intubated but awake at times. On norepi 12, epi 5, milrinone 0.375 and NO 20 ppm (weaning). Vent 40% FiO2  MAPs in 70s. Urine output about 50cc/hr. Getting 1u RBCs for HGB 7.2. Minimal drainage from chest tubes.   Swan #s CVP 13 PA 47/21 (31) CO/CI 6.5/3.4  Co-ox 71%  LVAD parameters   Speed 8800  Flow 5.5 PI 4.4 Power 5.2  No PI events overnight  Objective:   Weight Range:  Vital Signs:   Temp:  [96.6 F (35.9 C)-98.4 F (36.9 C)] 98.1 F (36.7 C) (02/10 1015) Pulse Rate:  [28-115] 88 (02/10 1015) Resp:  [0-29] 12 (02/10 1015) BP: (88-116)/(43-88) 88/57 (02/10 1000) SpO2:  [98 %-100 %] 100 % (02/10 1015) Arterial Line BP: (75-322)/(41-148) 82/70 (02/10 1015) FiO2 (%):  [40 %-50 %] 40 % (02/10 0824) Weight:  [197 lb 12 oz (89.7 kg)] 197 lb 12 oz (89.7 kg) (02/10 0500) Last BM Date: 12/11/16  Weight change: Filed Weights   12/12/16 0351 12/13/16 0530 12/14/16 0500  Weight: 185 lb 10 oz (84.2 kg) 181 lb 10.5 oz (82.4 kg) 197 lb 12 oz (89.7 kg)    Intake/Output:   Intake/Output Summary (Last 24 hours) at 12/14/16 1041 Last data filed at 12/14/16 1000  Gross per 24 hour  Intake         11025.85 ml  Output             5240 ml  Net          5785.85 ml     Physical Exam: General:  Intubated. Sedated but wakens to stimul HEENT: normal x for ETT.  Neck: supple. RIJ swan Cor: Sternal dressing in place. +CTs. LVAD hum  Lungs: CTAB  Abdomen: obese soft, NT. Hypoactive BS. Driveline site ok Extremities: no cyanosis, clubbing, rash.  1+ edema. IABP site ok  Neuro . Sedated but wakens to stimul GU: Foley   Telemetry: Reviewed,  Chronic A fib with v pacing at 88  Labs: Basic Metabolic Panel:  Recent Labs Lab 12/11/16 0305  12/12/16 0351 12/13/16 0405  12/13/16 1438 12/13/16 1727 12/13/16 1800 12/13/16 1817 12/13/16 2149 12/13/16 2153 12/14/16 0346  NA 132* 130* 130*  < > 133* 137 135 136  --  134* 132*  K 4.5 4.2 4.8  < > 4.4 4.2 4.1 4.2  --  3.9 4.2  CL 89* 87* 87*  < > 91*  --  97* 94*  --  94* 95*  CO2 31 31 32  --   --   --  28  --   --   --  26  GLUCOSE 162* 132* 149*  < > 147* 58* 72 75  --  129* 121*  BUN 44* 37* 35*  < > 35*  --  30* 30*  --  31* 32*  CREATININE 2.08* 1.71* 1.56*  < > 1.30*  --  1.44* 1.40* 1.65* 1.60* 1.57*  CALCIUM 9.5 9.3 9.3  --   --   --  8.2*  --   --   --  8.3*  MG 1.8  --   --   --   --   --  1.8  --  2.5*  --  2.7*  PHOS  --   --   --   --   --   --   --   --   --   --  3.5  < > = values in this interval not displayed.  Liver Function Tests:  Recent Labs Lab 12/14/16 0346  AST 83*  ALT 24  ALKPHOS 45  BILITOT 5.0*  PROT 5.2*  ALBUMIN 3.4*   No results for input(s): LIPASE, AMYLASE in the last 168 hours. No results for input(s): AMMONIA in the last 168 hours.  CBC:  Recent Labs Lab 12/12/16 0918 12/13/16 0405  12/13/16 1240  12/13/16 1738 12/13/16 1817 12/13/16 2149 12/13/16 2153 12/14/16 0346  WBC 10.0 11.2*  --   --   --  20.7*  --  19.1*  --  14.4*  NEUTROABS  --   --   --   --   --   --   --   --   --  11.2*  HGB 9.0* 9.4*  < > 7.0*  < > 9.4* 8.8* 7.2* 7.1* 7.7*  HCT 27.5* 28.3*  < > 20.7*  < > 27.5* 26.0* 20.6* 21.0* 22.9*  MCV 95.5 96.3  --   --   --  92.6  --  92.0  --  92.3  PLT 121* 106*  --  73*  --  89*  88*  --  136*  --  114*  < > = values in this interval not displayed.  Cardiac Enzymes:  Recent Labs Lab 12/12/16 0918  TROPONINI 0.06*    BNP: BNP (last 3 results)  Recent Labs  11/28/16 1104 12/14/16 0348  BNP 1,825.3* 427.7*    ProBNP (last 3 results)  Recent Labs  11/27/16 1207  PROBNP 1,746.0*      Other results:  Imaging: Dg Chest Port 1 View  Result Date: 12/14/2016 CLINICAL DATA:  LVAD EXAM:  PORTABLE CHEST 1 VIEW COMPARISON:  12/13/2016 FINDINGS: Support devices including bilateral chest tubes and endotracheal tube are stable. No visible pneumothorax. Mild cardiomegaly with vascular congestion. Bibasilar atelectasis. IMPRESSION: No pneumothorax. Vascular congestion and bibasilar atelectasis, slightly increased since prior study. Electronically Signed   By: Rolm Baptise M.D.   On: 12/14/2016 08:55   Dg Chest Port 1 View  Result Date: 12/13/2016 CLINICAL DATA:  Elective surgery.  LVAD EXAM: PORTABLE CHEST 1 VIEW COMPARISON:  Yesterday FINDINGS: Endotracheal tube tip between the clavicular heads and carina. There is a Swan-Ganz catheter from the left with tip directed towards the right main pulmonary artery. ICD/pacer from the right, single chamber, with lead in stable position. Status post CABG and transcatheter aortic valve replacement. An aortic balloon pump catheter is in unchanged position with tip projecting 4 cm below the top of the aortic arch. LVAD is now present. Bilateral thoracic drains. No visible pneumothorax. Postoperative perihilar atelectasis. No edema. Stable cardiopericardial size. IMPRESSION: Tubes and lines described above. Postoperative atelectasis.  No visible pneumothorax. Electronically Signed   By: Monte Fantasia M.D.   On: 12/13/2016 17:05   Dg Chest Port 1 View  Result Date: 12/12/2016 CLINICAL DATA:  Chest pain, history of CHF EXAM: PORTABLE CHEST 1 VIEW COMPARISON:  Portable chest x-ray of 12/12/2016 FINDINGS: The lungs appear better aerated. Cardiomegaly is stable. Aortic valve replacement is noted. AICD leads are unchanged in Swan-Ganz catheter tip remains in the right main pulmonary artery. IMPRESSION: 1. Lumbar mproved aeration. 2. Stable cardiomegaly. 3. AICD leads and Swan-Ganz catheter remain. Electronically Signed  By: Ivar Drape M.D.   On: 12/12/2016 13:19     Medications:     Scheduled Medications: . sodium chloride   Intravenous Once  .  arformoterol  15 mcg Nebulization BID  . aspirin EC  325 mg Oral Daily   Or  . aspirin  324 mg Per Tube Daily   Or  . aspirin  300 mg Rectal Daily  . bisacodyl  10 mg Oral Daily   Or  . bisacodyl  10 mg Rectal Daily  . budesonide (PULMICORT) nebulizer solution  0.5 mg Nebulization BID  . busPIRone  5 mg Oral BID  . cefUROXime (ZINACEF)  IV  1.5 g Intravenous Q12H  . chlorhexidine gluconate (MEDLINE KIT)  15 mL Mouth Rinse BID  . Chlorhexidine Gluconate Cloth  6 each Topical Daily  . docusate sodium  200 mg Oral Daily  . famotidine (PEPCID) IV  20 mg Intravenous Q12H  . fluticasone  1 spray Each Nare Daily  . furosemide  40 mg Intravenous Once  . insulin aspart  0-24 Units Subcutaneous Q4H  . insulin detemir  12 Units Subcutaneous Daily  . levothyroxine  25 mcg Oral QAC breakfast  . mouth rinse  15 mL Mouth Rinse QID  . metoCLOPramide (REGLAN) injection  10 mg Intravenous Q6H  . pantoprazole (PROTONIX) IV  40 mg Intravenous Q24H  . sodium chloride flush  10-40 mL Intracatheter Q12H  . sodium chloride flush  3 mL Intravenous Q12H  . sodium chloride flush  3 mL Intravenous Q12H  . umeclidinium bromide  1 puff Inhalation Daily  . [START ON 12/15/2016] vancomycin  750 mg Intravenous Q24H    Infusions: . sodium chloride    . sodium chloride    . sodium chloride 20 mL/hr at 12/14/16 0300  . dexmedetomidine 0.7 mcg/kg/hr (12/14/16 1000)  . epinephrine 5 mcg/min (12/14/16 1000)  . insulin (NOVOLIN-R) infusion 2.8 Units/hr (12/14/16 1000)  . lactated ringers 20 mL/hr at 12/13/16 1900  . lactated ringers 20 mL/hr at 12/14/16 0300  . milrinone 0.375 mcg/kg/min (12/14/16 1000)  . norepinephrine (LEVOPHED) Adult infusion      PRN Medications: sodium chloride, sodium chloride, acetaminophen, albumin human, albuterol, midazolam, morphine injection, ondansetron (ZOFRAN) IV, oxyCODONE, sodium chloride flush, sodium chloride flush, sodium chloride flush, traMADol,  traZODone   Assessment/Plan/Discussion    1. Acute on chronic systolic CHF: Ischemic cardiomyopathy but with prominent RV dysfunction on echo and exam.  [employer] ICD.  Echo this admission shows EF 20-25% with moderately dilated LV and moderately dilated/moderately dysfunction RV with severe TR.  He was admitted with low output biventricular failure, cardiorenal syndrome, and marked volume overload. Failed dual inortrope support. IABP placed 2/7. HM-II and TVR performed 2/9 after discussion with [facility] team.  - POD #1 HM-II and TVR - Stable with vent support, NO, EPI 5 , norepi 12 and milrinone 0.375.  - Will wean NO and norepi throughout day.Continue milrinone and epi at current dose. - Agree with transfusion. Would diurese gently to facilitate possible extubation tomorrow (weight up 15 pounds post-op) 2. CAD: s/p CABG.  No signs/sx of ischemia.  3. AKI on CKD stage III: Suspect cardiorenal syndrome in setting of low output failure/RV failure.   --Creatinine stable 1.5-1.6 range today 4. Acute respiratory failure --Wean vent as tolerated post-op. ABG looks good. CXR ok.  5. Atrial fibrillation: Chronic.  Rate is not elevated. Now v-paced post-op - Anticoagulation per surgical team 6. Post-op anemia - Transfuse 7. Acute liver failure,  post-op --Bili 5.0. Likely hypoperfusion. Holding hepato-toxic agents. Will follow  8. Aortic stenosis s/p TAVR: Stable on echo.  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 Hgb stable at 9.0.  9. Hyponatremia: Na 132    45 minutes critical care time.   Glori Bickers, MD  12/14/2016, 10:41 AM  Advanced Heart Failure Team Pager [phone] (M-F; 7a - 4p)  Please contact [facility] for night-coverage after hours (4p -7a ) and weekends on [email or web address]

## 2016-12-14 NOTE — Progress Notes (Signed)
ET Tube has air leak with fully inflated cuff, Assisted by Dub Mikes A. RRT and tube was advanced 2 Cm 23 to 25 and now no air leak with MOV of 24 not lower 30's. Patient has BBS and able to suction easily for copious amounts of this secretions

## 2016-12-14 NOTE — Progress Notes (Signed)
1 Day Post-Op Procedure(s) (LRB): INSERTION OF IMPLANTABLE LEFT VENTRICULAR ASSIST DEVICE (N/A) TRICUSPID VALVE REPAIR WITH EDWARDS MC 3 TRICUSPID ANNULOPLASTY RING MODEL 4900 SIZE T 28 (N/A) TRANSESOPHAGEAL ECHOCARDIOGRAM (TEE) (N/A) Subjective: POD#! HM-II, tricuspid annuloplasty w/ redo sternotomy Postop coagulopathy- severe, PLTs < 65k, Hb this am 7.2 currently on vent with moderate inotropes, good VAD performance, Co-ox > 70% MAP > 70, CVP < 15 IABP out this am Weaning nitric oxide- aim for extubation tomorrow Urine output low with preop hx chronic renal disease Postop bilirubin 5- holding tylenol, lipitor, amiodarone Objective: Vital signs in last 24 hours: Temp:  [96.6 F (35.9 C)-98.4 F (36.9 C)] 98.1 F (36.7 C) (02/10 1000) Pulse Rate:  [28-115] 88 (02/10 1000) Cardiac Rhythm: Ventricular paced (02/10 0800) Resp:  [0-29] 12 (02/10 1000) BP: (88-116)/(43-88) 88/57 (02/10 1000) SpO2:  [98 %-100 %] 99 % (02/10 1000) Arterial Line BP: (75-322)/(41-148) 78/69 (02/10 1000) FiO2 (%):  [40 %-50 %] 40 % (02/10 0824) Weight:  [197 lb 12 oz (89.7 kg)] 197 lb 12 oz (89.7 kg) (02/10 0500)  Hemodynamic parameters for last 24 hours: PAP: (24-69)/(14-29) 41/21 CVP:  [0 mmHg-20 mmHg] 13 mmHg CO:  [6.4 L/min-8.5 L/min] 6.5 L/min CI:  [3.3 L/min/m2-4.4 L/min/m2] 3.4 L/min/m2  Intake/Output from previous day: 02/09 0701 - 02/10 0700 In: 10772.5 [I.V.:4356.5; FZ:[id number]; NG/GT:30; IV Piggyback:1950] Out: [id number] [Urine:1350; Blood:1755; Chest Tube:1990] Intake/Output this shift: Total I/O In: 588.4 [I.V.:378.4; NG/GT:60; IV Piggyback:150] Out: 235 [Urine:95; Chest Tube:140]  Sedated on vent Coarse breath sounds extrem warm Lab Results:  Recent Labs  12/13/16 2149 12/13/16 2153 12/14/16 0346  WBC 19.1*  --  14.4*  HGB 7.2* 7.1* 7.7*  HCT 20.6* 21.0* 22.9*  PLT 136*  --  114*   BMET:  Recent Labs  12/13/16 1800  12/13/16 2153 12/14/16 0346  NA 135  < > 134* 132*  K  4.1  < > 3.9 4.2  CL 97*  < > 94* 95*  CO2 28  --   --  26  GLUCOSE 72  < > 129* 121*  BUN 30*  < > 31* 32*  CREATININE 1.44*  < > 1.60* 1.57*  CALCIUM 8.2*  --   --  8.3*  < > = values in this interval not displayed.  PT/INR:  Recent Labs  12/14/16 0346  LABPROT 13.6  INR 1.04   ABG    Component Value Date/Time   PHART 7.429 12/14/2016 0400   HCO3 28.6 (H) 12/14/2016 0400   TCO2 30 12/14/2016 0400   O2SAT 99.0 12/14/2016 0400   CBG (last 3)   Recent Labs  12/14/16 0813 12/14/16 0901 12/14/16 1006  GLUCAP 107* 129* 120*    Assessment/Plan: S/P Procedure(s) (LRB): INSERTION OF IMPLANTABLE LEFT VENTRICULAR ASSIST DEVICE (N/A) TRICUSPID VALVE REPAIR WITH EDWARDS MC 3 TRICUSPID ANNULOPLASTY RING MODEL 4900 SIZE T 28 (N/A) TRANSESOPHAGEAL ECHOCARDIOGRAM (TEE) (N/A) Needs day os stability tx to keep Hb > 8.5 Lasix as needed Wean nitric and norepi to lower doses Keep intubated today   LOS: 16 days    Trevor [name] 12/14/2016

## 2016-12-15 ENCOUNTER — Inpatient Hospital Stay (HOSPITAL_COMMUNITY): Payer: Medicare Other

## 2016-12-15 LAB — CBC WITH DIFFERENTIAL/PLATELET
Basophils Absolute: 0.1 10*3/uL (ref 0.0–0.1)
Basophils Relative: 0 %
Eosinophils Absolute: 0.5 10*3/uL (ref 0.0–0.7)
Eosinophils Relative: 3 %
HCT: 25.4 % — ABNORMAL LOW (ref 39.0–52.0)
Hemoglobin: 8.6 g/dL — ABNORMAL LOW (ref 13.0–17.0)
Lymphocytes Relative: 4 %
Lymphs Abs: 0.6 10*3/uL — ABNORMAL LOW (ref 0.7–4.0)
MCH: 29.6 pg (ref 26.0–34.0)
MCHC: 33.9 g/dL (ref 30.0–36.0)
MCV: 87.3 fL (ref 78.0–100.0)
Monocytes Absolute: 2.7 10*3/uL — ABNORMAL HIGH (ref 0.1–1.0)
Monocytes Relative: 16 %
Neutro Abs: 12.7 10*3/uL — ABNORMAL HIGH (ref 1.7–7.7)
Neutrophils Relative %: 77 %
Platelets: 81 10*3/uL — ABNORMAL LOW (ref 150–400)
RBC: 2.91 MIL/uL — ABNORMAL LOW (ref 4.22–5.81)
RDW: 16.8 % — ABNORMAL HIGH (ref 11.5–15.5)
WBC: 16.5 10*3/uL — ABNORMAL HIGH (ref 4.0–10.5)

## 2016-12-15 LAB — POCT I-STAT, CHEM 8
BUN: 35 mg/dL — AB (ref 6–20)
Calcium, Ion: 1.15 mmol/L (ref 1.15–1.40)
Chloride: 91 mmol/L — ABNORMAL LOW (ref 101–111)
Creatinine, Ser: 1.9 mg/dL — ABNORMAL HIGH (ref 0.61–1.24)
Glucose, Bld: 184 mg/dL — ABNORMAL HIGH (ref 65–99)
HCT: 23 % — ABNORMAL LOW (ref 39.0–52.0)
Hemoglobin: 7.8 g/dL — ABNORMAL LOW (ref 13.0–17.0)
POTASSIUM: 4.2 mmol/L (ref 3.5–5.1)
SODIUM: 132 mmol/L — AB (ref 135–145)
TCO2: 28 mmol/L (ref 0–100)

## 2016-12-15 LAB — GLUCOSE, CAPILLARY
GLUCOSE-CAPILLARY: 157 mg/dL — AB (ref 65–99)
GLUCOSE-CAPILLARY: 171 mg/dL — AB (ref 65–99)
GLUCOSE-CAPILLARY: 136 mg/dL — AB (ref 65–99)
Glucose-Capillary: 104 mg/dL — ABNORMAL HIGH (ref 65–99)
Glucose-Capillary: 179 mg/dL — ABNORMAL HIGH (ref 65–99)
Glucose-Capillary: 169 mg/dL — ABNORMAL HIGH (ref 65–99)
Glucose-Capillary: 113 mg/dL — ABNORMAL HIGH (ref 65–99)

## 2016-12-15 LAB — COMPREHENSIVE METABOLIC PANEL
ALT: 26 U/L (ref 17–63)
AST: 76 U/L — ABNORMAL HIGH (ref 15–41)
Albumin: 3.1 g/dL — ABNORMAL LOW (ref 3.5–5.0)
Alkaline Phosphatase: 48 U/L (ref 38–126)
Anion gap: 11 (ref 5–15)
BUN: 32 mg/dL — ABNORMAL HIGH (ref 6–20)
CO2: 26 mmol/L (ref 22–32)
Calcium: 8.1 mg/dL — ABNORMAL LOW (ref 8.9–10.3)
Chloride: 95 mmol/L — ABNORMAL LOW (ref 101–111)
Creatinine, Ser: 1.76 mg/dL — ABNORMAL HIGH (ref 0.61–1.24)
GFR calc Af Amer: 43 mL/min — ABNORMAL LOW (ref >60)
GFR calc non Af Amer: 37 mL/min — ABNORMAL LOW (ref >60)
Glucose, Bld: 172 mg/dL — ABNORMAL HIGH (ref 65–99)
Potassium: 4.4 mmol/L (ref 3.5–5.1)
Sodium: 132 mmol/L — ABNORMAL LOW (ref 135–145)
Total Bilirubin: 3.9 mg/dL — ABNORMAL HIGH (ref 0.3–1.2)
Total Protein: 5.2 g/dL — ABNORMAL LOW (ref 6.5–8.1)

## 2016-12-15 LAB — POCT I-STAT 3, ART BLOOD GAS (G3+)
Acid-Base Excess: 2 mmol/L (ref 0.0–2.0)
Acid-Base Excess: 5 mmol/L — ABNORMAL HIGH (ref 0.0–2.0)
BICARBONATE: 26.9 mmol/L (ref 20.0–28.0)
BICARBONATE: 29 mmol/L — AB (ref 20.0–28.0)
O2 SAT: 99 %
O2 Saturation: 99 %
PCO2 ART: 41.8 mmHg (ref 32.0–48.0)
PCO2 ART: 41.1 mmHg (ref 32.0–48.0)
PO2 ART: 137 mmHg — AB (ref 83.0–108.0)
PO2 ART: 151 mmHg — AB (ref 83.0–108.0)
Patient temperature: 37.1
TCO2: 28 mmol/L (ref 0–100)
TCO2: 30 mmol/L (ref 0–100)
pH, Arterial: 7.418 (ref 7.350–7.450)
pH, Arterial: 7.457 — ABNORMAL HIGH (ref 7.350–7.450)

## 2016-12-15 LAB — CBC
HCT: 23.7 % — ABNORMAL LOW (ref 39.0–52.0)
Hemoglobin: 8.3 g/dL — ABNORMAL LOW (ref 13.0–17.0)
MCH: 30.6 pg (ref 26.0–34.0)
MCHC: 35 g/dL (ref 30.0–36.0)
MCV: 87.5 fL (ref 78.0–100.0)
Platelets: 77 10*3/uL — ABNORMAL LOW (ref 150–400)
RBC: 2.71 MIL/uL — ABNORMAL LOW (ref 4.22–5.81)
RDW: 17.5 % — ABNORMAL HIGH (ref 11.5–15.5)
WBC: 17.2 10*3/uL — ABNORMAL HIGH (ref 4.0–10.5)

## 2016-12-15 LAB — LACTATE DEHYDROGENASE: LDH: 326 U/L — ABNORMAL HIGH (ref 98–192)

## 2016-12-15 LAB — COOXEMETRY PANEL
Carboxyhemoglobin: 1.9 % — ABNORMAL HIGH (ref 0.5–1.5)
Methemoglobin: 1.5 % (ref 0.0–1.5)
O2 Saturation: 87.4 %
Total hemoglobin: 8.6 g/dL — ABNORMAL LOW (ref 12.0–16.0)

## 2016-12-15 LAB — MAGNESIUM: Magnesium: 2.3 mg/dL (ref 1.7–2.4)

## 2016-12-15 LAB — PROTIME-INR
INR: 1.51
Prothrombin Time: 18.4 seconds — ABNORMAL HIGH (ref 11.4–15.2)

## 2016-12-15 LAB — PHOSPHORUS: Phosphorus: 4.2 mg/dL (ref 2.5–4.6)

## 2016-12-15 MED ORDER — WARFARIN - PHYSICIAN DOSING INPATIENT
Freq: Every day | Status: DC
  Administered 2016-12-15 – 2016-12-18 (×2)

## 2016-12-15 MED ORDER — GUAIFENESIN ER 600 MG PO TB12
600.0000 mg | ORAL_TABLET | Freq: Two times a day (BID) | ORAL | Status: DC
  Administered 2016-12-15 – 2017-01-01 (×34): 600 mg via ORAL
  Filled 2016-12-15 (×34): qty 1

## 2016-12-15 MED ORDER — WARFARIN SODIUM 2 MG PO TABS
2.0000 mg | ORAL_TABLET | Freq: Every day | ORAL | Status: DC
  Administered 2016-12-15 – 2016-12-16 (×2): 2 mg via ORAL
  Filled 2016-12-15 (×2): qty 1

## 2016-12-15 MED ORDER — INSULIN DETEMIR 100 UNIT/ML ~~LOC~~ SOLN
10.0000 [IU] | Freq: Two times a day (BID) | SUBCUTANEOUS | Status: DC
  Administered 2016-12-15 – 2016-12-16 (×3): 10 [IU] via SUBCUTANEOUS
  Filled 2016-12-15 (×4): qty 0.1

## 2016-12-15 MED ORDER — HYDROCOD POLST-CPM POLST ER 10-8 MG/5ML PO SUER
5.0000 mL | Freq: Two times a day (BID) | ORAL | Status: DC
  Administered 2016-12-15 – 2016-12-19 (×9): 5 mL via ORAL
  Filled 2016-12-15 (×9): qty 5

## 2016-12-15 MED ORDER — SILDENAFIL CITRATE 20 MG PO TABS
20.0000 mg | ORAL_TABLET | Freq: Three times a day (TID) | ORAL | Status: DC
  Administered 2016-12-15 – 2016-12-17 (×6): 20 mg via ORAL
  Filled 2016-12-15 (×9): qty 1

## 2016-12-15 MED ORDER — LIDOCAINE HCL (CARDIAC) 20 MG/ML IV SOLN
100.0000 mg | Freq: Once | INTRAVENOUS | Status: AC
  Administered 2016-12-15: 100 mg via INTRAVENOUS

## 2016-12-15 MED ORDER — FENTANYL CITRATE (PF) 100 MCG/2ML IJ SOLN
50.0000 ug | INTRAMUSCULAR | Status: DC | PRN
  Administered 2016-12-15 – 2016-12-19 (×14): 50 ug via INTRAVENOUS
  Filled 2016-12-15 (×15): qty 2

## 2016-12-15 NOTE — Progress Notes (Signed)
eLink Physician-Brief Progress Note Patient Name: Trevor [name] DOB: June 30, 1946 MRN: [id number]   Date of Service  12/15/2016  HPI/Events of Note  Camera check on patient postextubation. Patient extubated at approximately 10 AM this morning. Respiratory rate 18 breaths per minute and saturating 100% on nasal cannula oxygen. Heart rate 83. BP 105/71 per arterial line. Patient currently sleeping and appears comfortable.   eICU Interventions  Continued clinical care per primary service.      Intervention Category Major Interventions: Respiratory failure - evaluation and management  Tera Partridge 12/15/2016, 3:45 PM

## 2016-12-15 NOTE — Progress Notes (Signed)
HeartMate 2 Rounding Note  Postop day 2 implantation HeartMate 2 VAD and tricuspid valve annuloplasty repair  Subjective:   Patient with ischemic cardiomyopathy, severe tricuspid regurgitation, ejection fraction 20% and chronic renal insufficiency. The patient was in cardiogenic shock with severe volume overload and deterioration in renal function on admission. He required inotropes and subsequently balloon pump in order to optimize his hemodynamic and renal status prior to VAD implantation on February 9.  [name] surgery was complicated by moderate RV dysfunction requiring inotropes and coagulopathy requiring platelets and component transfusion. His chest tube drainage the past 24 hours has been approximately 50-60 cc/h, chest x-rays clear  [name] renal function remains stable with creatinine 1.7 and excellent urine output 150-200 cc per hour on Lasix drip The patient is awake and responsive on ventilator and is being weaned off ventilator today VAD parameters are satisfactory with VAD flow greater than 5 L and PI 5.0-5.5. Several PI events early this morning The patient is in chronic atrial fibrillation and has a permanent pacemaker-AICD.  LVAD INTERROGATION:  HeartMate II LVAD:  Flow 5.5 liters/min, speed 8800 RPM, power 5.1, PI 5.8.  Controller intact  Objective:    Vital Signs:   Temp:  [97.9 F (36.6 C)-100.4 F (38 C)] 98.4 F (36.9 C) (02/11 0900) Pulse Rate:  [36-102] 73 (02/11 0900) Resp:  [4-31] 12 (02/11 0900) BP: (73-114)/(57-89) 105/87 (02/11 0900) SpO2:  [97 %-100 %] 100 % (02/11 0900) Arterial Line BP: (59-120)/(50-74) 109/71 (02/11 0900) FiO2 (%):  [40 %] 40 % (02/11 0900) Weight:  [194 lb 10.7 oz (88.3 kg)] 194 lb 10.7 oz (88.3 kg) (02/11 0442) Last BM Date: 12/11/16 Mean arterial Pressure 80-80 4 mmHg  Intake/Output:   Intake/Output Summary (Last 24 hours) at 12/15/16 0906 Last data filed at 12/15/16 0800  Gross per 24 hour  Intake           5174.18 ml  Output             5470 ml  Net          -295.82 ml     Physical Exam: General:  Well appearing. No resp difficulty, Comfortable on ventilator HEENT: normal Neck: supple. JVP normal . Carotids trace pulsatility bilat; no bruits. No lymphadenopathy or thryomegaly appreciated. Cor: Mechanical heart sounds with LVAD hum present. Lungs: clear Abdomen: soft, nontender, nondistended. No hepatosplenomegaly. No bruits or masses. Good bowel sounds. Extremities: no cyanosis, clubbing, rash, edema Neuro: alert & orientedx3, cranial nerves grossly intact. moves all 4 extremities w/o difficulty. Affect pleasant  Telemetry: Paced 75/m, atrial fibrillation  Labs: Basic Metabolic Panel:  Recent Labs Lab 12/13/16 0405  12/13/16 1800  12/13/16 2149 12/13/16 2153 12/14/16 0346 12/14/16 1630 12/14/16 1635 12/15/16 0230  NA 130*  < > 135  < >  --  134* 132* 134* 132* 132*  K 4.8  < > 4.1  < >  --  3.9 4.2 4.4 4.2 4.4  CL 87*  < > 97*  < >  --  94* 95* 98* 95* 95*  CO2 32  --  28  --   --   --  26 25  --  26  GLUCOSE 149*  < > 72  < >  --  129* 121* 128* 133* 172*  BUN 35*  < > 30*  < >  --  31* 32* 33* 32* 32*  CREATININE 1.56*  < > 1.44*  < > 1.65* 1.60* 1.57* 1.77* 1.90* 1.76*  CALCIUM  9.3  --  8.2*  --   --   --  8.3* 8.2*  --  8.1*  MG  --   --  1.8  --  2.5*  --  2.7* 2.3  --  2.3  PHOS  --   --   --   --   --   --  3.5  --   --  4.2  < > = values in this interval not displayed.  Liver Function Tests:  Recent Labs Lab 12/14/16 0346 12/15/16 0230  AST 83* 76*  ALT 24 26  ALKPHOS 45 48  BILITOT 5.0* 3.9*  PROT 5.2* 5.2*  ALBUMIN 3.4* 3.1*   No results for input(s): LIPASE, AMYLASE in the last 168 hours. No results for input(s): AMMONIA in the last 168 hours.  CBC:  Recent Labs Lab 12/13/16 1738  12/13/16 2149 12/13/16 2153 12/14/16 0346 12/14/16 1630 12/14/16 1635 12/15/16 0230  WBC 20.7*  --  19.1*  --  14.4* 13.7*  --  16.5*  NEUTROABS  --   --   --    --  11.2*  --   --  12.7*  HGB 9.4*  < > 7.2* 7.1* 7.7* 7.3* 10.5* 8.6*  HCT 27.5*  < > 20.6* 21.0* 22.9* 21.4* 31.0* 25.4*  MCV 92.6  --  92.0  --  92.3 89.5  --  87.3  PLT 89*  88*  --  136*  --  114* 93*  --  81*  < > = values in this interval not displayed.  INR:  Recent Labs Lab 12/10/16 0445 12/13/16 1738 12/13/16 2233 12/14/16 0346 12/15/16 0230  INR 1.28 1.57 1.67 1.04 1.51    Other results:  EKG:   Imaging: Dg Chest Port 1 View  Result Date: 12/14/2016 CLINICAL DATA:  LVAD EXAM: PORTABLE CHEST 1 VIEW COMPARISON:  12/13/2016 FINDINGS: Support devices including bilateral chest tubes and endotracheal tube are stable. No visible pneumothorax. Mild cardiomegaly with vascular congestion. Bibasilar atelectasis. IMPRESSION: No pneumothorax. Vascular congestion and bibasilar atelectasis, slightly increased since prior study. Electronically Signed   By: Rolm Baptise M.D.   On: 12/14/2016 08:55   Dg Chest Port 1 View  Result Date: 12/13/2016 CLINICAL DATA:  Elective surgery.  LVAD EXAM: PORTABLE CHEST 1 VIEW COMPARISON:  Yesterday FINDINGS: Endotracheal tube tip between the clavicular heads and carina. There is a Swan-Ganz catheter from the left with tip directed towards the right main pulmonary artery. ICD/pacer from the right, single chamber, with lead in stable position. Status post CABG and transcatheter aortic valve replacement. An aortic balloon pump catheter is in unchanged position with tip projecting 4 cm below the top of the aortic arch. LVAD is now present. Bilateral thoracic drains. No visible pneumothorax. Postoperative perihilar atelectasis. No edema. Stable cardiopericardial size. IMPRESSION: Tubes and lines described above. Postoperative atelectasis.  No visible pneumothorax. Electronically Signed   By: Monte Fantasia M.D.   On: 12/13/2016 17:05      Medications:     Scheduled Medications: . sodium chloride   Intravenous Once  . sodium chloride   Intravenous  Once  . arformoterol  15 mcg Nebulization BID  . aspirin EC  325 mg Oral Daily   Or  . aspirin  324 mg Per Tube Daily   Or  . aspirin  300 mg Rectal Daily  . bisacodyl  10 mg Oral Daily   Or  . bisacodyl  10 mg Rectal Daily  . budesonide (PULMICORT) nebulizer solution  0.5 mg Nebulization BID  . busPIRone  5 mg Oral BID  . cefUROXime (ZINACEF)  IV  1.5 g Intravenous Q12H  . chlorhexidine gluconate (MEDLINE KIT)  15 mL Mouth Rinse BID  . Chlorhexidine Gluconate Cloth  6 each Topical Daily  . docusate sodium  200 mg Oral Daily  . fluticasone  1 spray Each Nare Daily  . insulin aspart  0-24 Units Subcutaneous Q4H  . insulin detemir  12 Units Subcutaneous Daily  . levothyroxine  25 mcg Oral QAC breakfast  . mouth rinse  15 mL Mouth Rinse QID  . metoCLOPramide (REGLAN) injection  10 mg Intravenous Q6H  . pantoprazole (PROTONIX) IV  40 mg Intravenous QHS  . sodium chloride flush  10-40 mL Intracatheter Q12H  . sodium chloride flush  3 mL Intravenous Q12H  . sodium chloride flush  3 mL Intravenous Q12H  . umeclidinium bromide  1 puff Inhalation Daily  . vancomycin  1,250 mg Intravenous Q24H  . warfarin  2 mg Oral q1800  . Warfarin - Physician Dosing Inpatient   Does not apply q1800     Infusions: . sodium chloride    . sodium chloride    . sodium chloride 20 mL/hr at 12/14/16 0300  . epinephrine 5 mcg/min (12/15/16 0700)  . furosemide (LASIX) infusion 8 mg/hr (12/15/16 0700)  . lactated ringers 20 mL/hr at 12/13/16 1900  . lactated ringers 20 mL/hr at 12/15/16 0100  . milrinone 0.375 mcg/kg/min (12/15/16 0700)  . norepinephrine (LEVOPHED) Adult infusion 4 mcg/min (12/15/16 0800)  . vasopressin (PITRESSIN) infusion - *FOR SHOCK* 0.03 Units/min (12/15/16 0700)     PRN Medications:  sodium chloride, sodium chloride, acetaminophen, albumin human, albuterol, midazolam, morphine injection, ondansetron (ZOFRAN) IV, oxyCODONE, sodium chloride flush, sodium chloride flush, sodium  chloride flush, traMADol, traZODone   Assessment:  Acute on chronic systolic heart failure from ischemic cardiomyopathy treated with HeartMate 2 implantation for destination therapy indication Moderate RV failure requiring inotropes and inhaled nitric oxide Preoperative acute on chronic renal failure Severe tricuspid regurgitation corrected with 28 mm ring annuloplasty Preoperative COPD-asthmatic bronchitis with low diffusion capacity less than 40% predicted Postoperative coagulopathy and expected acute blood loss anemia Acute liver failure with bilirubin 5.0 postop now improving Preoperative hyponatremia   Plan/Discussion:    Plan to wean Precedex and extubate, continued inhaled nitric oxide through nasal cannula 4 ppm Leave chest tubes for persistent chest tube drainage Start low-dose Coumadin 2 mg daily Continue Lasix drip for 12 pound weight gain following surgery Wean pressors as tolerated Leave pump speed at 8800 RPM with severalPI events this a.m.  I reviewed the LVAD parameters from today, and compared the results to the patient's prior recorded data.  No programming changes were made.  The LVAD is functioning within specified parameters.  The patient performs LVAD self-test daily.  LVAD interrogation was negative for any significant power changes, alarms or PI events/speed drops.  LVAD equipment check completed and is in good working order.  Back-up equipment present.   LVAD education done on emergency procedures and precautions and reviewed exit site care.  Length of Stay: [place] III 12/15/2016, 9:06 AM

## 2016-12-15 NOTE — Progress Notes (Signed)
NIF -30 and VC 86ml with good pt effort.

## 2016-12-15 NOTE — Plan of Care (Signed)
Problem: Activity: Goal: Risk for activity intolerance will decrease Outcome: Progressing Dangled and stood at side of bed with minimal to moderate assist. No PI events during activity.   Problem: Cardiac: Goal: Ability to maintain an adequate cardiac output will improve Outcome: Progressing CI remain greater than 3, less frequent ectopy, MAPs maintaining >70  Problem: Education: Goal: Knowledge of the prescribed therapeutic regimen will improve Outcome: Progressing Patient asking appropriate questions about therapy regimen and shows willingness to learn and be active in care  Problem: Fluid Volume: Goal: Risk for excess fluid volume will decrease Outcome: Progressing OUP> 200cc/hour on Lasix drip  Problem: Respiratory: Goal: Ability to maintain adequate ventilation will improve Outcome: Progressing Extubated to 4L nasal cannula with nitric oxide bled in. No s/s of respiratory distress. Continuing pulmonary hygiene

## 2016-12-15 NOTE — Progress Notes (Signed)
Advanced Heart Failure Rounding Note   Subjective:    Admitted with marked volume overload and low output HF.  Echo EF 20-25. Failed dual inotropes. IABP placed 2/7.   Underwent HM-II placement and TVR on 2/9  POD #2 today  Awake on vent. On norepi 4, epi 5, milrinone 0.375, vaso 0.04 and NO 4 ppm (weaning). Vent 30% FiO2  MAPs in 80s. On lasix gtt at 8/hr. Diuresing about 200/hr. Weight down 3 pounds  Chest tubes still draining a bit.   Swan #s CVP 10 PA 57/19 (30) CO/CI 5.8/3.0  Co-ox 87%  LVAD parameters   Speed 8800  Flow 4.7 PI 6.4 Power 4.8  several PI events  this am   Objective:   Weight Range:  Vital Signs:   Temp:  [97.9 F (36.6 C)-100.4 F (38 C)] 98.6 F (37 C) (02/11 0915) Pulse Rate:  [36-102] 71 (02/11 0915) Resp:  [11-31] 13 (02/11 0915) BP: (73-114)/(57-89) 105/87 (02/11 0900) SpO2:  [97 %-100 %] 100 % (02/11 0915) Arterial Line BP: (59-120)/(50-74) 95/65 (02/11 0915) FiO2 (%):  [40 %] 40 % (02/11 0900) Weight:  [88.3 kg (194 lb 10.7 oz)] 88.3 kg (194 lb 10.7 oz) (02/11 0442) Last BM Date: 12/11/16  Weight change: Filed Weights   12/13/16 0530 12/14/16 0500 12/15/16 0442  Weight: 82.4 kg (181 lb 10.5 oz) 89.7 kg (197 lb 12 oz) 88.3 kg (194 lb 10.7 oz)    Intake/Output:   Intake/Output Summary (Last 24 hours) at 12/15/16 0922 Last data filed at 12/15/16 0916  Gross per 24 hour  Intake          5743.51 ml  Output             5860 ml  Net          -116.49 ml     Physical Exam: General:  Intubated. Sedated but awake and interactive HEENT: normal x for ETT.  Neck: supple. RIJ swan Cor: Sternal dressing in place. +CTs. LVAD hum  Lungs: CTAB  Abdomen: obese soft, NT. Hypoactive BS. Driveline site ok Extremities: no cyanosis, clubbing, rash.  trace edema. IABP site ok  Neuro . Awake on vent following commands GU: Foley   Telemetry: Reviewed,  Chronic A fib 70s  Labs: Basic Metabolic Panel:  Recent Labs Lab 12/13/16 0405   12/13/16 1800  12/13/16 2149 12/13/16 2153 12/14/16 0346 12/14/16 1630 12/14/16 1635 12/15/16 0230  NA 130*  < > 135  < >  --  134* 132* 134* 132* 132*  K 4.8  < > 4.1  < >  --  3.9 4.2 4.4 4.2 4.4  CL 87*  < > 97*  < >  --  94* 95* 98* 95* 95*  CO2 32  --  28  --   --   --  26 25  --  26  GLUCOSE 149*  < > 72  < >  --  129* 121* 128* 133* 172*  BUN 35*  < > 30*  < >  --  31* 32* 33* 32* 32*  CREATININE 1.56*  < > 1.44*  < > 1.65* 1.60* 1.57* 1.77* 1.90* 1.76*  CALCIUM 9.3  --  8.2*  --   --   --  8.3* 8.2*  --  8.1*  MG  --   --  1.8  --  2.5*  --  2.7* 2.3  --  2.3  PHOS  --   --   --   --   --   --  3.5  --   --  4.2  < > = values in this interval not displayed.  Liver Function Tests:  Recent Labs Lab 12/14/16 0346 12/15/16 0230  AST 83* 76*  ALT 24 26  ALKPHOS 45 48  BILITOT 5.0* 3.9*  PROT 5.2* 5.2*  ALBUMIN 3.4* 3.1*   No results for input(s): LIPASE, AMYLASE in the last 168 hours. No results for input(s): AMMONIA in the last 168 hours.  CBC:  Recent Labs Lab 12/13/16 1738  12/13/16 2149 12/13/16 2153 12/14/16 0346 12/14/16 1630 12/14/16 1635 12/15/16 0230  WBC 20.7*  --  19.1*  --  14.4* 13.7*  --  16.5*  NEUTROABS  --   --   --   --  11.2*  --   --  12.7*  HGB 9.4*  < > 7.2* 7.1* 7.7* 7.3* 10.5* 8.6*  HCT 27.5*  < > 20.6* 21.0* 22.9* 21.4* 31.0* 25.4*  MCV 92.6  --  92.0  --  92.3 89.5  --  87.3  PLT 89*  88*  --  136*  --  114* 93*  --  81*  < > = values in this interval not displayed.  Cardiac Enzymes:  Recent Labs Lab 12/12/16 0918  TROPONINI 0.06*    BNP: BNP (last 3 results)  Recent Labs  11/28/16 1104 12/14/16 0348  BNP 1,825.3* 427.7*    ProBNP (last 3 results)  Recent Labs  11/27/16 1207  PROBNP 1,746.0*      Other results:  Imaging: Dg Chest Port 1 View  Result Date: 12/15/2016 CLINICAL DATA:  LVAD. EXAM: PORTABLE CHEST 1 VIEW COMPARISON:  12/14/2016. FINDINGS: Stable cardiomegaly, post CABG, LVAD, and valve  replacement. Stable RIGHT-sided pacer. Stable support apparatus. Improving basilar atelectasis. No pneumothorax or edema. IMPRESSION: Improved aeration.  No acute abnormalities. Electronically Signed   By: Staci Righter M.D.   On: 12/15/2016 09:15   Dg Chest Port 1 View  Result Date: 12/14/2016 CLINICAL DATA:  LVAD EXAM: PORTABLE CHEST 1 VIEW COMPARISON:  12/13/2016 FINDINGS: Support devices including bilateral chest tubes and endotracheal tube are stable. No visible pneumothorax. Mild cardiomegaly with vascular congestion. Bibasilar atelectasis. IMPRESSION: No pneumothorax. Vascular congestion and bibasilar atelectasis, slightly increased since prior study. Electronically Signed   By: Rolm Baptise M.D.   On: 12/14/2016 08:55   Dg Chest Port 1 View  Result Date: 12/13/2016 CLINICAL DATA:  Elective surgery.  LVAD EXAM: PORTABLE CHEST 1 VIEW COMPARISON:  Yesterday FINDINGS: Endotracheal tube tip between the clavicular heads and carina. There is a Swan-Ganz catheter from the left with tip directed towards the right main pulmonary artery. ICD/pacer from the right, single chamber, with lead in stable position. Status post CABG and transcatheter aortic valve replacement. An aortic balloon pump catheter is in unchanged position with tip projecting 4 cm below the top of the aortic arch. LVAD is now present. Bilateral thoracic drains. No visible pneumothorax. Postoperative perihilar atelectasis. No edema. Stable cardiopericardial size. IMPRESSION: Tubes and lines described above. Postoperative atelectasis.  No visible pneumothorax. Electronically Signed   By: Monte Fantasia M.D.   On: 12/13/2016 17:05     Medications:     Scheduled Medications: . sodium chloride   Intravenous Once  . sodium chloride   Intravenous Once  . arformoterol  15 mcg Nebulization BID  . aspirin EC  325 mg Oral Daily   Or  . aspirin  324 mg Per Tube Daily   Or  . aspirin  300 mg Rectal  Daily  . bisacodyl  10 mg Oral Daily   Or   . bisacodyl  10 mg Rectal Daily  . budesonide (PULMICORT) nebulizer solution  0.5 mg Nebulization BID  . busPIRone  5 mg Oral BID  . cefUROXime (ZINACEF)  IV  1.5 g Intravenous Q12H  . chlorhexidine gluconate (MEDLINE KIT)  15 mL Mouth Rinse BID  . Chlorhexidine Gluconate Cloth  6 each Topical Daily  . docusate sodium  200 mg Oral Daily  . fluticasone  1 spray Each Nare Daily  . insulin aspart  0-24 Units Subcutaneous Q4H  . insulin detemir  12 Units Subcutaneous Daily  . levothyroxine  25 mcg Oral QAC breakfast  . mouth rinse  15 mL Mouth Rinse QID  . metoCLOPramide (REGLAN) injection  10 mg Intravenous Q6H  . pantoprazole (PROTONIX) IV  40 mg Intravenous QHS  . sodium chloride flush  10-40 mL Intracatheter Q12H  . sodium chloride flush  3 mL Intravenous Q12H  . sodium chloride flush  3 mL Intravenous Q12H  . umeclidinium bromide  1 puff Inhalation Daily  . vancomycin  1,250 mg Intravenous Q24H  . warfarin  2 mg Oral q1800  . Warfarin - Physician Dosing Inpatient   Does not apply q1800    Infusions: . sodium chloride    . sodium chloride    . sodium chloride 20 mL/hr at 12/14/16 0300  . epinephrine 5 mcg/min (12/15/16 0900)  . furosemide (LASIX) infusion 8 mg/hr (12/15/16 0900)  . lactated ringers 20 mL/hr at 12/13/16 1900  . lactated ringers 20 mL/hr at 12/15/16 0100  . milrinone 0.375 mcg/kg/min (12/15/16 0900)  . norepinephrine (LEVOPHED) Adult infusion 4 mcg/min (12/15/16 0900)  . vasopressin (PITRESSIN) infusion - *FOR SHOCK* 0.03 Units/min (12/15/16 0900)    PRN Medications: sodium chloride, sodium chloride, acetaminophen, albumin human, albuterol, midazolam, morphine injection, ondansetron (ZOFRAN) IV, oxyCODONE, sodium chloride flush, sodium chloride flush, sodium chloride flush, traMADol, traZODone   Assessment/Plan/Discussion    1. Acute on chronic systolic CHF: Ischemic cardiomyopathy but with prominent RV dysfunction on echo and exam.  [employer] ICD.  Echo  this admission shows EF 20-25% with moderately dilated LV and moderately dilated/moderately dysfunction RV with severe TR.  He was admitted with low output biventricular failure, cardiorenal syndrome, and marked volume overload. Failed dual inortrope support. IABP placed 2/7. HM-II and TVR performed 2/9 after discussion with [facility] transplant team.  - POD #2 HM-II and TVR - Stable with vent support, NO 4, EPI 5 , norepi 4, vaso 0.04 and milrinone 0.375.  - Will continue wean vaso, NO and norepi throughout day.Continue milrinone and epi at current dose. - Likely extubate later this am - Continue lasix drip (weight up 12 pounds post-op) - Watch WBCs. Continue abx  2. CAD: s/p CABG.  No signs/sx of ischemia.  3. AKI on CKD stage III: Suspect cardiorenal syndrome in setting of low output failure/RV failure.   --Creatinine up slightly 1.5->1.7. Will follow 4. Acute respiratory failure --Wean vent today. CXR ok.  5. Atrial fibrillation: Chronic.  Rate is not elevated.  - Anticoagulation per surgical team. Starting warfarin today 6. Post-op anemia - hgb 8.6 today 7. Acute liver failure, post-op --Bili improving 5.0-> 3.9. Likely hypoperfusion. Holding hepato-toxic agents.  8. Aortic stenosis s/p TAVR: Stable on echo.  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 Hgb stable at 9.0.  9. Hyponatremia: Na 132  stable  35 minutes critical care time.   Glori Bickers, MD  12/15/2016, 9:22 AM  Advanced Heart Failure Team Pager [phone] (M-F; [place])  Please contact [facility] for night-coverage after hours (4p -7a ) and weekends on [email or web address]

## 2016-12-15 NOTE — Progress Notes (Signed)
OT Cancellation Note  Patient Details Name: Trevor [name] MRN: [id number] DOB: July 20, 1946   Cancelled Treatment:    Reason Eval/Treat Not Completed: Patient not medically ready. Will follow up as time allows.  Binnie Kand M.S., OTR/L Pager: [phone]  12/15/2016, 7:40 AM

## 2016-12-15 NOTE — Procedures (Signed)
Extubation Procedure Note  Patient Details:   Name: Trevor [name] DOB: September 13, 1946 MRN: [id number]   Airway Documentation:     Evaluation  O2 sats: stable throughout Complications: No apparent complications Patient did tolerate procedure well. Bilateral Breath Sounds: Diminished, Rhonchi   Yes   Pt extubated per SICU wean protocol.  Pt placed on 4L [place] with 4ppm Nitric bleed in per MD order. Pt is tolerating well, vital signs WNL.  RN at bedside.  RT will continue to monitor.  Pierre Bali 12/15/2016, 10:03 AM

## 2016-12-16 ENCOUNTER — Inpatient Hospital Stay (HOSPITAL_COMMUNITY): Payer: Medicare Other

## 2016-12-16 LAB — POCT I-STAT 3, ART BLOOD GAS (G3+)
Acid-Base Excess: 4 mmol/L — ABNORMAL HIGH (ref 0.0–2.0)
Bicarbonate: 29.5 mmol/L — ABNORMAL HIGH (ref 20.0–28.0)
O2 Saturation: 99 %
Patient temperature: 36.9
TCO2: 31 mmol/L (ref 0–100)
pCO2 arterial: 46.5 mmHg (ref 32.0–48.0)
pH, Arterial: 7.41 (ref 7.350–7.450)
pO2, Arterial: 126 mmHg — ABNORMAL HIGH (ref 83.0–108.0)

## 2016-12-16 LAB — TYPE AND SCREEN
ABO/RH(D): A NEG
Antibody Screen: NEGATIVE
UNIT DIVISION: 0
UNIT DIVISION: 0
UNIT DIVISION: 0
UNIT DIVISION: 0
Unit division: 0
Unit division: 0
Unit division: 0
Unit division: 0
Unit division: 0
Unit division: 0

## 2016-12-16 LAB — CBC WITH DIFFERENTIAL/PLATELET
Basophils Absolute: 0 10*3/uL (ref 0.0–0.1)
Basophils Relative: 0 %
Eosinophils Absolute: 0.3 10*3/uL (ref 0.0–0.7)
Eosinophils Relative: 2 %
HCT: 23.5 % — ABNORMAL LOW (ref 39.0–52.0)
Hemoglobin: 8.1 g/dL — ABNORMAL LOW (ref 13.0–17.0)
Lymphocytes Relative: 5 %
Lymphs Abs: 0.8 10*3/uL (ref 0.7–4.0)
MCH: 30.5 pg (ref 26.0–34.0)
MCHC: 34.5 g/dL (ref 30.0–36.0)
MCV: 88.3 fL (ref 78.0–100.0)
Monocytes Absolute: 2.1 10*3/uL — ABNORMAL HIGH (ref 0.1–1.0)
Monocytes Relative: 13 %
Neutro Abs: 12.9 10*3/uL — ABNORMAL HIGH (ref 1.7–7.7)
Neutrophils Relative %: 80 %
Platelets: 79 10*3/uL — ABNORMAL LOW (ref 150–400)
RBC: 2.66 MIL/uL — ABNORMAL LOW (ref 4.22–5.81)
RDW: 17.6 % — ABNORMAL HIGH (ref 11.5–15.5)
WBC: 16.1 10*3/uL — ABNORMAL HIGH (ref 4.0–10.5)

## 2016-12-16 LAB — COMPREHENSIVE METABOLIC PANEL
ALT: 29 U/L (ref 17–63)
AST: 52 U/L — ABNORMAL HIGH (ref 15–41)
Albumin: 3.1 g/dL — ABNORMAL LOW (ref 3.5–5.0)
Alkaline Phosphatase: 54 U/L (ref 38–126)
Anion gap: 11 (ref 5–15)
BUN: 38 mg/dL — ABNORMAL HIGH (ref 6–20)
CO2: 26 mmol/L (ref 22–32)
Calcium: 8.2 mg/dL — ABNORMAL LOW (ref 8.9–10.3)
Chloride: 94 mmol/L — ABNORMAL LOW (ref 101–111)
Creatinine, Ser: 1.85 mg/dL — ABNORMAL HIGH (ref 0.61–1.24)
GFR calc Af Amer: 41 mL/min — ABNORMAL LOW (ref >60)
GFR calc non Af Amer: 35 mL/min — ABNORMAL LOW (ref >60)
Glucose, Bld: 85 mg/dL (ref 65–99)
Potassium: 3.6 mmol/L (ref 3.5–5.1)
Sodium: 131 mmol/L — ABNORMAL LOW (ref 135–145)
Total Bilirubin: 2.7 mg/dL — ABNORMAL HIGH (ref 0.3–1.2)
Total Protein: 5.6 g/dL — ABNORMAL LOW (ref 6.5–8.1)

## 2016-12-16 LAB — CBC
HCT: 23.2 % — ABNORMAL LOW (ref 39.0–52.0)
Hemoglobin: 7.8 g/dL — ABNORMAL LOW (ref 13.0–17.0)
MCH: 29.9 pg (ref 26.0–34.0)
MCHC: 33.6 g/dL (ref 30.0–36.0)
MCV: 88.9 fL (ref 78.0–100.0)
Platelets: 74 10*3/uL — ABNORMAL LOW (ref 150–400)
RBC: 2.61 MIL/uL — ABNORMAL LOW (ref 4.22–5.81)
RDW: 17.5 % — ABNORMAL HIGH (ref 11.5–15.5)
WBC: 17.3 10*3/uL — ABNORMAL HIGH (ref 4.0–10.5)

## 2016-12-16 LAB — COOXEMETRY PANEL
Carboxyhemoglobin: 1.9 % — ABNORMAL HIGH (ref 0.5–1.5)
Methemoglobin: 1 % (ref 0.0–1.5)
O2 Saturation: 69.8 %
Total hemoglobin: 8.8 g/dL — ABNORMAL LOW (ref 12.0–16.0)

## 2016-12-16 LAB — GLUCOSE, CAPILLARY
Glucose-Capillary: 89 mg/dL (ref 65–99)
Glucose-Capillary: 94 mg/dL (ref 65–99)
Glucose-Capillary: 111 mg/dL — ABNORMAL HIGH (ref 65–99)
Glucose-Capillary: 215 mg/dL — ABNORMAL HIGH (ref 65–99)
Glucose-Capillary: 152 mg/dL — ABNORMAL HIGH (ref 65–99)

## 2016-12-16 LAB — POCT I-STAT, CHEM 8
BUN: 39 mg/dL — ABNORMAL HIGH (ref 6–20)
CALCIUM ION: 1.23 mmol/L (ref 1.15–1.40)
CREATININE: 2 mg/dL — AB (ref 0.61–1.24)
Chloride: 91 mmol/L — ABNORMAL LOW (ref 101–111)
GLUCOSE: 216 mg/dL — AB (ref 65–99)
HCT: 21 % — ABNORMAL LOW (ref 39.0–52.0)
HEMOGLOBIN: 7.1 g/dL — AB (ref 13.0–17.0)
POTASSIUM: 4 mmol/L (ref 3.5–5.1)
Sodium: 129 mmol/L — ABNORMAL LOW (ref 135–145)
TCO2: 28 mmol/L (ref 0–100)

## 2016-12-16 LAB — MAGNESIUM: Magnesium: 2.1 mg/dL (ref 1.7–2.4)

## 2016-12-16 LAB — PROTIME-INR
INR: 1.39
Prothrombin Time: 17.1 seconds — ABNORMAL HIGH (ref 11.4–15.2)

## 2016-12-16 LAB — PHOSPHORUS: Phosphorus: 4.3 mg/dL (ref 2.5–4.6)

## 2016-12-16 LAB — LACTATE DEHYDROGENASE: LDH: 296 U/L — ABNORMAL HIGH (ref 98–192)

## 2016-12-16 LAB — PREPARE RBC (CROSSMATCH)

## 2016-12-16 MED ORDER — SODIUM CHLORIDE 0.9 % IV SOLN
30.0000 meq | Freq: Once | INTRAVENOUS | Status: AC
  Administered 2016-12-16: 30 meq via INTRAVENOUS
  Filled 2016-12-16: qty 15

## 2016-12-16 MED ORDER — CHLORHEXIDINE GLUCONATE CLOTH 2 % EX PADS
6.0000 | MEDICATED_PAD | Freq: Every day | CUTANEOUS | Status: DC
  Administered 2016-12-16: 6 via TOPICAL

## 2016-12-16 MED ORDER — CALCIUM CHLORIDE 10 % IV SOLN
INTRAVENOUS | Status: AC
  Administered 2016-12-16: 1000 mg
  Filled 2016-12-16: qty 10

## 2016-12-16 MED ORDER — POTASSIUM CHLORIDE CRYS ER 20 MEQ PO TBCR
40.0000 meq | EXTENDED_RELEASE_TABLET | Freq: Once | ORAL | Status: AC
  Administered 2016-12-16: 40 meq via ORAL
  Filled 2016-12-16: qty 2

## 2016-12-16 MED ORDER — AMIODARONE HCL IN DEXTROSE 360-4.14 MG/200ML-% IV SOLN
30.0000 mg/h | INTRAVENOUS | Status: DC
  Administered 2016-12-16 – 2016-12-17 (×4): 30 mg/h via INTRAVENOUS
  Filled 2016-12-16 (×2): qty 200

## 2016-12-16 MED ORDER — METOLAZONE 5 MG PO TABS
5.0000 mg | ORAL_TABLET | Freq: Every day | ORAL | Status: DC
  Administered 2016-12-16: 5 mg via ORAL
  Filled 2016-12-16: qty 1

## 2016-12-16 MED ORDER — PANTOPRAZOLE SODIUM 40 MG PO TBEC
40.0000 mg | DELAYED_RELEASE_TABLET | Freq: Every day | ORAL | Status: DC
  Administered 2016-12-16 – 2017-01-01 (×17): 40 mg via ORAL
  Filled 2016-12-16 (×17): qty 1

## 2016-12-16 MED ORDER — SODIUM CHLORIDE 0.9% FLUSH
10.0000 mL | INTRAVENOUS | Status: DC | PRN
  Administered 2016-12-24 – 2016-12-30 (×3): 10 mL
  Filled 2016-12-16 (×3): qty 40

## 2016-12-16 MED ORDER — AMIODARONE HCL IN DEXTROSE 360-4.14 MG/200ML-% IV SOLN
60.0000 mg/h | INTRAVENOUS | Status: AC
  Administered 2016-12-16 (×2): 60 mg/h via INTRAVENOUS
  Filled 2016-12-16 (×2): qty 200

## 2016-12-16 MED ORDER — MIDAZOLAM HCL 2 MG/2ML IJ SOLN: 2.0000 mg | INTRAMUSCULAR | Status: DC | PRN

## 2016-12-16 MED ORDER — ORAL CARE MOUTH RINSE
15.0000 mL | Freq: Two times a day (BID) | OROMUCOSAL | Status: DC
  Administered 2016-12-16 – 2016-12-31 (×20): 15 mL via OROMUCOSAL

## 2016-12-16 MED ORDER — FUROSEMIDE 10 MG/ML IJ SOLN
40.0000 mg | Freq: Two times a day (BID) | INTRAMUSCULAR | Status: DC
  Administered 2016-12-16: 40 mg via INTRAVENOUS
  Filled 2016-12-16: qty 4

## 2016-12-16 MED ORDER — SODIUM CHLORIDE 0.9% FLUSH
10.0000 mL | Freq: Two times a day (BID) | INTRAVENOUS | Status: DC
Start: 2016-12-16 — End: 2017-01-01
  Administered 2016-12-17 – 2016-12-19 (×6): 10 mL
  Administered 2016-12-21: 20 mL
  Administered 2016-12-21 – 2016-12-22 (×4): 10 mL

## 2016-12-16 MED ORDER — FUROSEMIDE 10 MG/ML IJ SOLN
INTRAMUSCULAR | Status: AC
  Administered 2016-12-16: 40 mg
  Filled 2016-12-16: qty 4

## 2016-12-16 NOTE — Progress Notes (Signed)
HeartMate 2 Rounding Note  Postop day 3 implantation HeartMate 2 VAD and tricuspid valve annuloplasty repair  Subjective:   Patient with ischemic cardiomyopathy, severe tricuspid regurgitation, ejection fraction 20% and chronic renal insufficiency. The patient was in cardiogenic shock with severe volume overload and deterioration in renal function on admission. He required inotropes and subsequently balloon pump in order to optimize his hemodynamic and renal status prior to VAD implantation on February 9.  [name] surgery was complicated by moderate RV dysfunction requiring inotropes and coagulopathy requiring platelets and component transfusion. His chest tube drainage the past 24 hours has been approximately 1000 cc serosanguinous fluid- Hb 8.2 stable, chest x-rays clear  [name] renal function remains stable with creatinine 1.9 and adequate urine output, transitioned off lasix drip The patient is awake and responsive OOB to chair after being extubated POD #2  VAD parameters are satisfactory with VAD flow greater than 5 L and PI 5.0-5.5. Few PI events early this morning The patient is in chronic atrial fibrillation and has a permanent pacemaker-AICD.  LVAD INTERROGATION:  HeartMate II LVAD:  Flow 5.5 liters/min, speed 9000 RPM, power 5.1, PI 5.8.  Controller intact  Objective:    Vital Signs:   Temp:  [98.1 F (36.7 C)-99.1 F (37.3 C)] 98.2 F (36.8 C) (02/12 0800) Pulse Rate:  [29-114] 48 (02/12 0815) Resp:  [10-24] 15 (02/12 0815) BP: (76-138)/(53-120) 138/120 (02/12 0815) SpO2:  [89 %-100 %] 100 % (02/12 6237) Arterial Line BP: (67-121)/(58-91) 97/69 (02/12 0815) Weight:  [197 lb 12 oz (89.7 kg)] 197 lb 12 oz (89.7 kg) (02/12 0311) Last BM Date: 12/15/16 Mean arterial Pressure 80-80 4 mmHg  Intake/Output:   Intake/Output Summary (Last 24 hours) at 12/16/16 0950 Last data filed at 12/16/16 0900  Gross per 24 hour  Intake          2711.27 ml  Output              3805 ml  Net         -1093.73 ml     Physical Exam: General:  Well appearing. No resp difficulty, Comfortable in chair HEENT: normal Neck: supple. JVP normal . Carotids trace pulsatility bilat; no bruits. No lymphadenopathy or thryomegaly appreciated. Cor: Mechanical heart sounds with LVAD hum present. Lungs: clear, cough congested Abdomen: soft, nontender, nondistended. No hepatosplenomegaly. No bruits or masses. Good bowel sounds. Extremities: no cyanosis, clubbing, rash, edema Neuro: alert & orientedx3, cranial nerves grossly intact. moves all 4 extremities w/o difficulty. Affect pleasant  Telemetry: Paced 75/m, atrial fibrillation  Labs: Basic Metabolic Panel:  Recent Labs Lab 12/13/16 1800  12/13/16 2149  12/14/16 0346 12/14/16 1630 12/14/16 1635 12/15/16 0230 12/15/16 1524 12/16/16 0330  NA 135  < >  --   < > 132* 134* 132* 132* 132* 131*  K 4.1  < >  --   < > 4.2 4.4 4.2 4.4 4.2 3.6  CL 97*  < >  --   < > 95* 98* 95* 95* 91* 94*  CO2 28  --   --   --  26 25  --  26  --  26  GLUCOSE 72  < >  --   < > 121* 128* 133* 172* 184* 85  BUN 30*  < >  --   < > 32* 33* 32* 32* 35* 38*  CREATININE 1.44*  < > 1.65*  < > 1.57* 1.77* 1.90* 1.76* 1.90* 1.85*  CALCIUM 8.2*  --   --   --  8.3* 8.2*  --  8.1*  --  8.2*  MG 1.8  --  2.5*  --  2.7* 2.3  --  2.3  --  2.1  PHOS  --   --   --   --  3.5  --   --  4.2  --  4.3  < > = values in this interval not displayed.  Liver Function Tests:  Recent Labs Lab 12/14/16 0346 12/15/16 0230 12/16/16 0330  AST 83* 76* 52*  ALT 24 26 29   ALKPHOS 45 48 54  BILITOT 5.0* 3.9* 2.7*  PROT 5.2* 5.2* 5.6*  ALBUMIN 3.4* 3.1* 3.1*   No results for input(s): LIPASE, AMYLASE in the last 168 hours. No results for input(s): AMMONIA in the last 168 hours.  CBC:  Recent Labs Lab 12/14/16 0346 12/14/16 1630 12/14/16 1635 12/15/16 0230 12/15/16 1524 12/15/16 1800 12/16/16 0330  WBC 14.4* 13.7*  --  16.5*  --  17.2* 16.1*  NEUTROABS  11.2*  --   --  12.7*  --   --  12.9*  HGB 7.7* 7.3* 10.5* 8.6* 7.8* 8.3* 8.1*  HCT 22.9* 21.4* 31.0* 25.4* 23.0* 23.7* 23.5*  MCV 92.3 89.5  --  87.3  --  87.5 88.3  PLT 114* 93*  --  81*  --  77* 79*    INR:  Recent Labs Lab 12/13/16 1738 12/13/16 2233 12/14/16 0346 12/15/16 0230 12/16/16 0330  INR 1.57 1.67 1.04 1.51 1.39    Other results:  EKG:   Imaging: Dg Chest Port 1 View  Result Date: 12/16/2016 CLINICAL DATA:  Left ventricular assist device. EXAM: PORTABLE CHEST 1 VIEW COMPARISON:  12/15/2016 FINDINGS: Interim extubation and removal of NG tube. Left subclavian line stable position. Bilateral chest tubes in stable position. No pneumothorax. Cardiac pacer in stable position. Left ventricular assist device in stable position. Prior CABG. Prior cardiac valve replacement. Cardiomegaly with mild bilateral from interstitial prominence again noted. Small bilateral pleural effusions. IMPRESSION: 1. Interim removal of endotracheal tube and NG tube. Left subclavian line and bilateral chest tubes remain in stable position. No pneumothorax. 2. Prior CABG and cardiac valve replacement. Cardiac pacer stable position. Left ventricular assist device in stable position. Cardiomegaly with mild bilateral interstitial prominence and small pleural effusions consistent with mild CHF. No change prior exam. Electronically Signed   By: Marcello Moores  Register   On: 12/16/2016 07:15   Dg Chest Port 1 View  Result Date: 12/15/2016 CLINICAL DATA:  LVAD. EXAM: PORTABLE CHEST 1 VIEW COMPARISON:  12/14/2016. FINDINGS: Stable cardiomegaly, post CABG, LVAD, and valve replacement. Stable RIGHT-sided pacer. Stable support apparatus. Improving basilar atelectasis. No pneumothorax or edema. IMPRESSION: Improved aeration.  No acute abnormalities. Electronically Signed   By: Staci Righter M.D.   On: 12/15/2016 09:15     Medications:     Scheduled Medications: . sodium chloride   Intravenous Once  . sodium chloride    Intravenous Once  . arformoterol  15 mcg Nebulization BID  . aspirin EC  325 mg Oral Daily   Or  . aspirin  324 mg Per Tube Daily   Or  . aspirin  300 mg Rectal Daily  . bisacodyl  10 mg Oral Daily   Or  . bisacodyl  10 mg Rectal Daily  . budesonide (PULMICORT) nebulizer solution  0.5 mg Nebulization BID  . busPIRone  5 mg Oral BID  . chlorhexidine gluconate (MEDLINE KIT)  15 mL Mouth Rinse BID  . Chlorhexidine Gluconate Cloth  6 each Topical Daily  . chlorpheniramine-HYDROcodone  5 mL Oral Q12H  . docusate sodium  200 mg Oral Daily  . fluticasone  1 spray Each Nare Daily  . furosemide  40 mg Intravenous BID  . guaiFENesin  600 mg Oral BID  . insulin aspart  0-24 Units Subcutaneous Q4H  . insulin detemir  10 Units Subcutaneous BID  . levothyroxine  25 mcg Oral QAC breakfast  . mouth rinse  15 mL Mouth Rinse QID  . metoCLOPramide (REGLAN) injection  10 mg Intravenous Q6H  . metolazone  5 mg Oral Daily  . pantoprazole  40 mg Oral Q1200  . sildenafil  20 mg Oral TID  . sodium chloride flush  10-40 mL Intracatheter Q12H  . sodium chloride flush  3 mL Intravenous Q12H  . sodium chloride flush  3 mL Intravenous Q12H  . umeclidinium bromide  1 puff Inhalation Daily  . warfarin  2 mg Oral q1800  . Warfarin - Physician Dosing Inpatient   Does not apply q1800    Infusions: . sodium chloride    . sodium chloride    . sodium chloride 20 mL/hr at 12/14/16 0300  . amiodarone 60 mg/hr (12/16/16 0900)  . amiodarone 30 mg/hr (12/16/16 3428)  . epinephrine 5.013 mcg/min (12/16/16 0900)  . lactated ringers 20 mL/hr at 12/13/16 1900  . lactated ringers 20 mL/hr at 12/16/16 0900  . milrinone 0.375 mcg/kg/min (12/16/16 0900)  . norepinephrine (LEVOPHED) Adult infusion 6 mcg/min (12/16/16 0900)  . vasopressin (PITRESSIN) infusion - *FOR SHOCK* Stopped (12/15/16 1800)    PRN Medications: sodium chloride, sodium chloride, acetaminophen, albuterol, fentaNYL (SUBLIMAZE) injection, midazolam,  ondansetron (ZOFRAN) IV, oxyCODONE, sodium chloride flush, sodium chloride flush, sodium chloride flush, traMADol, traZODone   Assessment:  Acute on chronic systolic heart failure from ischemic cardiomyopathy treated with HeartMate 2 implantation for destination therapy indication Moderate RV failure requiring inotropes and inhaled nitric oxide Preoperative acute on chronic renal failure Severe tricuspid regurgitation corrected with 28 mm ring annuloplasty Preoperative COPD-asthmatic bronchitis with low diffusion capacity less than 40% predicted Postoperative coagulopathy and expected acute blood loss anemia Acute liver failure  bilirubin 5.0 postop now improving < 3 Preoperative hyponatremia   Plan/Discussion:   Mobilize with PT  nitric oxide through nasal cannula 4 ppm Leave chest tubes for persistent chest tube drainage Start low-dose Coumadin 2 mg daily Continue Lasix , metolazone , keep CVP < 15 Wean pressors as tolerated Increase pump speed to 9000 rpm to wean norepinephrine, follow PI events  I reviewed the LVAD parameters from today, and compared the results to the patient's prior recorded data.  No programming changes were made.  The LVAD is functioning within specified parameters.  The patient performs LVAD self-test daily.  LVAD interrogation was negative for any significant power changes, alarms or PI events/speed drops.  LVAD equipment check completed and is in good working order.  Back-up equipment present.   LVAD education done on emergency procedures and precautions and reviewed exit site care.  Length of Stay: [place] 12/16/2016, 9:50 AM

## 2016-12-16 NOTE — Progress Notes (Signed)
CSW met at bedside with patient and sister. Patient out of bed and in chair and reports he is doing well although has some discomfort around the incision site. Patient's sister [name])  reports "he is doing much better than we hoped". She and her sister have made daily visits and pleased with his progress so far. [name] asked a few questions and noted "I ask a lot of questions". CSW encouraged her to continue to ask as that is how to learn. Patient and sister appear to be coping well under the circumstances and feel supported by staff. CSW will continue to follow for support and any needs as identified throughout hospitalization and outpatient clinic. Raquel Sarna, [place], [place]

## 2016-12-16 NOTE — Progress Notes (Signed)
Daily Progress Note   Patient Name: Trevor [name]       Date: 12/16/2016 DOB: 1945-11-10  Age: 71 y.o. MRN#: [id number] Attending Physician: Trevor Dresser, MD Primary Care Physician: No PCP Per Patient Admit Date: 11/27/2016  Reason for Consultation/Follow-up: VAD eval  Subjective:  Lying in bed.Awake and alert.    Length of Stay: 18  Current Medications: Scheduled Meds:  . sodium chloride   Intravenous Once  . sodium chloride   Intravenous Once  . arformoterol  15 mcg Nebulization BID  . aspirin EC  325 mg Oral Daily   Or  . aspirin  324 mg Per Tube Daily   Or  . aspirin  300 mg Rectal Daily  . bisacodyl  10 mg Oral Daily   Or  . bisacodyl  10 mg Rectal Daily  . budesonide (PULMICORT) nebulizer solution  0.5 mg Nebulization BID  . busPIRone  5 mg Oral BID  . Chlorhexidine Gluconate Cloth  6 each Topical Daily  . chlorpheniramine-HYDROcodone  5 mL Oral Q12H  . docusate sodium  200 mg Oral Daily  . fluticasone  1 spray Each Nare Daily  . furosemide  40 mg Intravenous BID  . guaiFENesin  600 mg Oral BID  . insulin aspart  0-24 Units Subcutaneous Q4H  . insulin detemir  10 Units Subcutaneous BID  . levothyroxine  25 mcg Oral QAC breakfast  . metoCLOPramide (REGLAN) injection  10 mg Intravenous Q6H  . pantoprazole  40 mg Oral Q1200  . sildenafil  20 mg Oral TID  . sodium chloride flush  10-40 mL Intracatheter Q12H  . sodium chloride flush  10-40 mL Intracatheter Q12H  . sodium chloride flush  3 mL Intravenous Q12H  . umeclidinium bromide  1 puff Inhalation Daily  . warfarin  2 mg Oral q1800  . Warfarin - Physician Dosing Inpatient   Does not apply q1800    Continuous Infusions: . sodium chloride    . sodium chloride 20 mL/hr (12/16/16 1607)  . amiodarone 30  mg/hr (12/16/16 1607)  . epinephrine 5 mcg/min (12/16/16 1606)  . lactated ringers 20 mL/hr at 12/13/16 1900  . lactated ringers 20 mL/hr at 12/16/16 1400  . milrinone 0.375 mcg/kg/min (12/16/16 1606)  . norepinephrine (LEVOPHED) Adult infusion 3.5 mcg/min (12/16/16 1606)  . vasopressin (PITRESSIN)  infusion - *FOR SHOCK* Stopped (12/15/16 1800)    PRN Meds: acetaminophen, albuterol, fentaNYL (SUBLIMAZE) injection, midazolam, ondansetron (ZOFRAN) IV, oxyCODONE, sodium chloride flush, sodium chloride flush, sodium chloride flush, traMADol, traZODone  Physical Exam  Constitutional: He is oriented to person, place, and time. He appears well-developed.  HENT:  Head: Normocephalic and atraumatic.  Cardiovascular: Normal rate.  An irregularly irregular rhythm present.  V paced  Pulmonary/Chest: Effort normal and breath sounds normal. No accessory muscle usage. No tachypnea. No respiratory distress.  Neurological: He is alert and oriented to person, place, and time.  Nursing note and vitals reviewed.           Vital Signs: BP (!) 138/120   Pulse 93   Temp 99.2 F (37.3 C) (Oral)   Resp 20   Ht _0  (1.6 m)   Wt 89.7 kg (197 lb 12 oz)   SpO2 100%   BMI 35.03 kg/m  SpO2: SpO2: 100 % O2 Device: O2 Device: Nasal Cannula O2 Flow Rate: O2 Flow Rate (L/min): 4 L/min  Intake/output summary:   Intake/Output Summary (Last 24 hours) at 12/16/16 1757 Last data filed at 12/16/16 1600  Gross per 24 hour  Intake          2458.58 ml  Output             2635 ml  Net          -176.42 ml   LBM: Last BM Date: 12/15/16 Baseline Weight: Weight: 93.7 kg (206 lb 9.1 oz) Most recent weight: Weight: 89.7 kg (197 lb 12 oz)       Palliative Assessment/Data:    Flowsheet Rows   Flowsheet Row Most Recent Value  Intake Tab  Referral Department  Cardiology  Unit at Time of Referral  ICU  Palliative Care Primary Diagnosis  Cardiac  Date Notified  12/03/16  Palliative Care Type  New Palliative  care  Reason for referral  Clarify Goals of Care, Other (Comment) [VAD Eval]  Date of Admission  11/27/16  # of days IP prior to Palliative referral  6  Clinical Assessment  Psychosocial & Spiritual Assessment  Palliative Care Outcomes      Patient Active Problem List   Diagnosis Date Noted  . Goals of care, counseling/discussion   . Palliative care encounter   . Acute on chronic systolic CHF (congestive heart failure) ([place]) 11/28/2016  . Acute on chronic kidney failure ([place]) 11/28/2016  . Cardiorenal syndrome with renal failure 11/28/2016  . Acute on chronic systolic CHF (congestive heart failure), NYHA class 4 ([place]) 11/28/2016  . Cardiomyopathy, ischemic 11/27/2016  . Coronary artery disease 11/27/2016  . Hx of CABG 11/27/2016  . Status post aortic valve replacement 11/27/2016  . Asthma, chronic 11/27/2016  . OSA (obstructive sleep apnea) 11/27/2016  . Chronic renal insufficiency 11/27/2016    Palliative Care Assessment & Plan   HPI: 71 y.o. 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 He was admitted on 11/27/2016 with chest pain, SOB, and worsening leg swelling. EF 20-25% now and unable to wean milrinone d/t worsening renal function. Now considering VAD. LVAD placed 12/13/16.    Assessment: I met again today with Trevor [name]. No family at bedside. He seems well and tells me that he is "doing okay." Sleep  has been difficult but he has been trying to rest some today. Has been up in the chair and says that this went well. Other than sleep he has no real concerns. He has had some pain but says this is better managed now. Encouraged rest. Emotional support provided.   Recommendations/Plan:  Insomnia: Continue trazodone and will obtain different bed for his comfort.     Anxiety: Continue Buspar 5 mg BID.    Goals of Care and Additional Recommendations:  Limitations on Scope of Treatment: Full Scope Treatment  Code Status:  Full code  Prognosis:   Unable to determine  Discharge Planning:  To Be Determined   Thank you for allowing the Palliative Medicine Team to assist in the care of this patient.   Total Time 64mn Prolonged Time Billed  no       Greater than 50%  of this time was spent counseling and coordinating care related to the above assessment and plan.  AVinie Sill NP Palliative Medicine Team Pager # [phone](M-F 8a-5p) Team Phone # [phone](Nights/Weekends)

## 2016-12-16 NOTE — Progress Notes (Signed)
CT surgery p.m. Rounds  Patient had good day up in chair for several hours VAD speed increased to 9000 rpm's without PI events today P.m. hemoglobin back to 7.4 and one unit blood transfused Chest tube drainage has significantly improved today We'll DC A-line and plan on ambulation with PT tomorrow

## 2016-12-16 NOTE — Progress Notes (Signed)
Patient ID: Trevor [name], male   DOB: 1945/12/28, 71 y.o.   MRN: [id number]    Advanced Heart Failure Rounding Note   Subjective:    Admitted with marked volume overload and low output HF.  Echo EF 20-25. Failed dual inotropes. IABP placed 2/7.   Underwent HM-II placement and TV repair on 2/9.   POD #3 today  Extubated, stable.  Off NO. On norepi 6, epi 5, milrinone 0.375.  Speed increased to 9000 rpm today to facilitate weaning pressors.   MAP 70s.  Good UOP yesterday on Lasix gtt, transitioned to IV boluses today and got dose of metolazone.  Creatinine stable at 1.85.   Chest tubes in, less drainage today.   Swan #s CVP 11 PA 62/18 CI 3.9 Co-ox 70%  LVAD parameters:   Speed 9000  Flow 6 PI 4.8 Power 5.7.  No PI events since yesterday morning.   Objective:   Weight Range:  Vital Signs:   Temp:  [98.1 F (36.7 C)-99.1 F (37.3 C)] 98.7 F (37.1 C) (02/12 1100) Pulse Rate:  [29-114] 52 (02/12 1000) Resp:  [10-24] 11 (02/12 1000) BP: (76-138)/(53-120) 138/120 (02/12 0815) SpO2:  [92 %-100 %] 100 % (02/12 1000) Arterial Line BP: (71-121)/(60-91) 86/64 (02/12 1000) Weight:  [197 lb 12 oz (89.7 kg)] 197 lb 12 oz (89.7 kg) (02/12 0311) Last BM Date: 12/15/16  Weight change: Filed Weights   12/14/16 0500 12/15/16 0442 12/16/16 0311  Weight: 197 lb 12 oz (89.7 kg) 194 lb 10.7 oz (88.3 kg) 197 lb 12 oz (89.7 kg)    Intake/Output:   Intake/Output Summary (Last 24 hours) at 12/16/16 1153 Last data filed at 12/16/16 0900  Gross per 24 hour  Intake          2517.98 ml  Output             3145 ml  Net          -627.02 ml     Physical Exam: General:  NAD, in chair.  HEENT: normal   Neck: supple. RIJ swan Cor: Sternal dressing in place. +CTs. LVAD hum  Lungs: CTAB  Abdomen: obese soft, NT. Hypoactive BS. Driveline site ok Extremities: no cyanosis, clubbing, rash.  trace edema. IABP site ok  Neuro: Alert/oriented.  GU: Foley   Telemetry: Reviewed,  Atrial  fibrillation with some v-pacing, rate around 90.   Labs: Basic Metabolic Panel:  Recent Labs Lab 12/13/16 1800  12/13/16 2149  12/14/16 0346 12/14/16 1630 12/14/16 1635 12/15/16 0230 12/15/16 1524 12/16/16 0330  NA 135  < >  --   < > 132* 134* 132* 132* 132* 131*  K 4.1  < >  --   < > 4.2 4.4 4.2 4.4 4.2 3.6  CL 97*  < >  --   < > 95* 98* 95* 95* 91* 94*  CO2 28  --   --   --  26 25  --  26  --  26  GLUCOSE 72  < >  --   < > 121* 128* 133* 172* 184* 85  BUN 30*  < >  --   < > 32* 33* 32* 32* 35* 38*  CREATININE 1.44*  < > 1.65*  < > 1.57* 1.77* 1.90* 1.76* 1.90* 1.85*  CALCIUM 8.2*  --   --   --  8.3* 8.2*  --  8.1*  --  8.2*  MG 1.8  --  2.5*  --  2.7* 2.3  --  2.3  --  2.1  PHOS  --   --   --   --  3.5  --   --  4.2  --  4.3  < > = values in this interval not displayed.  Liver Function Tests:  Recent Labs Lab 12/14/16 0346 12/15/16 0230 12/16/16 0330  AST 83* 76* 52*  ALT 24 26 29   ALKPHOS 45 48 54  BILITOT 5.0* 3.9* 2.7*  PROT 5.2* 5.2* 5.6*  ALBUMIN 3.4* 3.1* 3.1*   No results for input(s): LIPASE, AMYLASE in the last 168 hours. No results for input(s): AMMONIA in the last 168 hours.  CBC:  Recent Labs Lab 12/14/16 0346 12/14/16 1630 12/14/16 1635 12/15/16 0230 12/15/16 1524 12/15/16 1800 12/16/16 0330  WBC 14.4* 13.7*  --  16.5*  --  17.2* 16.1*  NEUTROABS 11.2*  --   --  12.7*  --   --  12.9*  HGB 7.7* 7.3* 10.5* 8.6* 7.8* 8.3* 8.1*  HCT 22.9* 21.4* 31.0* 25.4* 23.0* 23.7* 23.5*  MCV 92.3 89.5  --  87.3  --  87.5 88.3  PLT 114* 93*  --  81*  --  77* 79*    Cardiac Enzymes:  Recent Labs Lab 12/12/16 0918  TROPONINI 0.06*    BNP: BNP (last 3 results)  Recent Labs  11/28/16 1104 12/14/16 0348  BNP 1,825.3* 427.7*    ProBNP (last 3 results)  Recent Labs  11/27/16 1207  PROBNP 1,746.0*      Other results:  Imaging: Dg Chest Port 1 View  Result Date: 12/16/2016 CLINICAL DATA:  Left ventricular assist device. EXAM:  PORTABLE CHEST 1 VIEW COMPARISON:  12/15/2016 FINDINGS: Interim extubation and removal of NG tube. Left subclavian line stable position. Bilateral chest tubes in stable position. No pneumothorax. Cardiac pacer in stable position. Left ventricular assist device in stable position. Prior CABG. Prior cardiac valve replacement. Cardiomegaly with mild bilateral from interstitial prominence again noted. Small bilateral pleural effusions. IMPRESSION: 1. Interim removal of endotracheal tube and NG tube. Left subclavian line and bilateral chest tubes remain in stable position. No pneumothorax. 2. Prior CABG and cardiac valve replacement. Cardiac pacer stable position. Left ventricular assist device in stable position. Cardiomegaly with mild bilateral interstitial prominence and small pleural effusions consistent with mild CHF. No change prior exam. Electronically Signed   By: Marcello Moores  Register   On: 12/16/2016 07:15   Dg Chest Port 1 View  Result Date: 12/15/2016 CLINICAL DATA:  LVAD. EXAM: PORTABLE CHEST 1 VIEW COMPARISON:  12/14/2016. FINDINGS: Stable cardiomegaly, post CABG, LVAD, and valve replacement. Stable RIGHT-sided pacer. Stable support apparatus. Improving basilar atelectasis. No pneumothorax or edema. IMPRESSION: Improved aeration.  No acute abnormalities. Electronically Signed   By: Staci Righter M.D.   On: 12/15/2016 09:15     Medications:     Scheduled Medications: . sodium chloride   Intravenous Once  . sodium chloride   Intravenous Once  . arformoterol  15 mcg Nebulization BID  . aspirin EC  325 mg Oral Daily   Or  . aspirin  324 mg Per Tube Daily   Or  . aspirin  300 mg Rectal Daily  . bisacodyl  10 mg Oral Daily   Or  . bisacodyl  10 mg Rectal Daily  . budesonide (PULMICORT) nebulizer solution  0.5 mg Nebulization BID  . busPIRone  5 mg Oral BID  . Chlorhexidine Gluconate Cloth  6 each Topical Daily  . chlorpheniramine-HYDROcodone  5 mL Oral Q12H  .  docusate sodium  200 mg Oral  Daily  . fluticasone  1 spray Each Nare Daily  . furosemide  40 mg Intravenous BID  . guaiFENesin  600 mg Oral BID  . insulin aspart  0-24 Units Subcutaneous Q4H  . insulin detemir  10 Units Subcutaneous BID  . levothyroxine  25 mcg Oral QAC breakfast  . metoCLOPramide (REGLAN) injection  10 mg Intravenous Q6H  . metolazone  5 mg Oral Daily  . pantoprazole  40 mg Oral Q1200  . sildenafil  20 mg Oral TID  . sodium chloride flush  10-40 mL Intracatheter Q12H  . sodium chloride flush  3 mL Intravenous Q12H  . sodium chloride flush  3 mL Intravenous Q12H  . umeclidinium bromide  1 puff Inhalation Daily  . warfarin  2 mg Oral q1800  . Warfarin - Physician Dosing Inpatient   Does not apply q1800    Infusions: . sodium chloride    . sodium chloride    . sodium chloride 20 mL/hr at 12/14/16 0300  . amiodarone 60 mg/hr (12/16/16 1059)  . amiodarone 30 mg/hr (12/16/16 1121)  . epinephrine 5.013 mcg/min (12/16/16 0900)  . lactated ringers 20 mL/hr at 12/13/16 1900  . lactated ringers 20 mL/hr at 12/16/16 0900  . milrinone 0.375 mcg/kg/min (12/16/16 0900)  . norepinephrine (LEVOPHED) Adult infusion 6 mcg/min (12/16/16 0900)  . vasopressin (PITRESSIN) infusion - *FOR SHOCK* Stopped (12/15/16 1800)    PRN Medications: sodium chloride, sodium chloride, acetaminophen, albuterol, fentaNYL (SUBLIMAZE) injection, midazolam, ondansetron (ZOFRAN) IV, oxyCODONE, sodium chloride flush, sodium chloride flush, sodium chloride flush, traMADol, traZODone   Assessment/Plan/Discussion    1. Acute on chronic systolic CHF: Ischemic cardiomyopathy but with prominent RV dysfunction on echo and exam.  [employer] ICD.  Echo this admission showed EF 20-25% with moderately dilated LV and moderately dilated/moderately dysfunctional RV with severe TR.  He was admitted with low output biventricular failure, cardiorenal syndrome, and marked volume overload. Failed dual inotrope support. IABP placed 2/7. HM-II and TV  repair performed 2/9 after discussion with [facility] transplant team. POD #3 HM-II and TV repair.  Extubated 2/11, off NO and vasopressin. Speed increased to 9000 rpm to facilitate weaning pressors.  - Continue milrinone 0.375.  Will work on weaning norepinephrine then epinephrine.   - CVP coming down.  Good UOP yesterday, now off Lasix gtt and getting Lasix 40 mg IV bid + metolazone dose today.  Will reassess tomorrow to decide on diuretic regimen.   - Sildenafil begun at 20 mg tid for RV failure.  Now off NO.  - Warfarin started.  Continue ASA 325 until INR > 2 then decrease to 81.  2. CAD: s/p CABG.  No signs/sx of ischemia.  3. AKI on CKD stage III: Suspect cardiorenal syndrome in setting of low output failure/RV failure.  Creatinine slightly lower today, good UOP.  4. Atrial fibrillation: Chronic.  Rate is not elevated.  - Anticoagulation per surgical team. Warfarin has been started.  5. Post-op anemia: Also component of anemia of renal disease/chronic disease.  Hgb fairly stable.  6. Acute liver failure, post-op: Bili improving 5.0 -> 3.9 -> 2.7. Likely hypoperfusion. Holding hepato-toxic agents.  7. Aortic stenosis s/p TAVR: Stable on echo.  8. ID: Afebrile.  WBCs elevated but lower.  Continue surveillance, pending respiratory cultures.  9. Thrombocytopenia: Stable plts at 79.   35 minutes critical care time.   Loralie Champagne, MD  12/16/2016, 11:53 AM  Advanced Heart Failure Team Pager [phone] (M-F; 7a -  4p)  Please contact [facility] for night-coverage after hours (4p -7a ) and weekends on [email or web address]

## 2016-12-16 NOTE — Progress Notes (Signed)
Peripherally Inserted Central Catheter/Midline Placement  The IV Nurse has discussed with the patient and/or persons authorized to consent for the patient, the purpose of this procedure and the potential benefits and risks involved with this procedure.  The benefits include less needle sticks, lab draws from the catheter, and the patient may be discharged home with the catheter. Risks include, but not limited to, infection, bleeding, blood clot (thrombus formation), and puncture of an artery; nerve damage and irregular heartbeat and possibility to perform a PICC exchange if needed/ordered by physician.  Alternatives to this procedure were also discussed.  Bard Power PICC patient education guide, fact sheet on infection prevention and patient information card has been provided to patient /or left at bedside.    PICC/Midline Placement Documentation        Krysta Bloomfield, Nicolette Bang 12/16/2016, 3:21 PM

## 2016-12-16 NOTE — Plan of Care (Signed)
Problem: Activity: Goal: Risk for activity intolerance will decrease Outcome: Progressing Pt was able to stand at bedside and dangle with no difficulty.   Problem: Respiratory: Goal: Ability to maintain adequate ventilation will improve Outcome: Progressing Pt is consistently coughing and deep breathing, getting a moderate amount of sputum up.

## 2016-12-16 NOTE — Evaluation (Signed)
Physical Therapy Evaluation Patient Details Name: Trevor [name] MRN: [id number] DOB: [birth year] Today's Date: 12/16/2016   History of Present Illness   71 y.o.malewith medical history significant of CHF, CKD, DM2, asthma. s/p LVAD.   Clinical Impression  Pt presents with decreased strength, balance and activity tolerance secondary to above. PTA pt was I with all activity and working, but now requires assistance with all mobility. Pt would benefit from continued PT to improve strength, activity tolerance and independence to allow for safe d/c home with supervision when medically ready. Will follow acutely.     Follow Up Recommendations Home health PT;Supervision for mobility/OOB    Equipment Recommendations  None recommended by PT    Recommendations for Other Services       Precautions / Restrictions Precautions Precautions: Sternal Precaution Comments: reviewed sternal precautions Restrictions Weight Bearing Restrictions: Yes Other Position/Activity Restrictions: no push/pull with bilat UEs      Mobility  Bed Mobility Overal bed mobility: Needs Assistance Bed Mobility: Supine to Sit     Supine to sit: Mod assist;+2 for physical assistance;+2 for safety/equipment     General bed mobility comments: HOB elevated; requires mod x 2 for trunk elevation due to sternal precautions  Transfers Overall transfer level: Needs assistance Equipment used: None (rocking technique with assist from PT) Transfers: Sit to/from Stand;Stand Pivot Transfers Sit to Stand: +2 safety/equipment;Mod assist Stand pivot transfers: +2 safety/equipment;Mod assist       General transfer comment: stood for 2 min with AP weight shifts and marching; stand pivot to recliner  Ambulation/Gait                Stairs            Wheelchair Mobility    Modified Rankin (Stroke Patients Only)       Balance Overall balance assessment: Needs assistance Sitting-balance support: Feet  supported;No upper extremity supported Sitting balance-[name]: Good Sitting balance - Comments: sat EOB x 3 min without LOB    Standing balance support: No upper extremity supported Standing balance-[name]: Good Standing balance comment: stood x 2 min with marching and AP weight shift                             Pertinent Vitals/Pain Pain Assessment: 0-10 Pain Score: 7  Pain Location: incisional pain Pain Descriptors / Indicators: Discomfort Pain Intervention(s): Limited activity within patient's tolerance    Home Living Family/patient expects to be discharged to:: Private residence Living Arrangements: Other relatives (sisters) Available Help at Discharge: Family;Available 24 hours/day Type of Home: Other(Comment) (condo) Home Access: Level entry     Home Layout: One level Home Equipment: Shower seat      Prior Function Level of Independence: Independent         Comments: 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, working at golf course, 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 of AD, standing to shower but has shower seat available     Hand Dominance        Extremity/Trunk Assessment   Upper Extremity Assessment Upper Extremity Assessment: Generalized weakness (no MMT due to sternal precautions)    Lower Extremity Assessment Lower Extremity Assessment: Generalized weakness    Cervical / Trunk Assessment Cervical / Trunk Assessment: Normal;Other exceptions;Kyphotic Cervical / Trunk Exceptions: pt underwent LVAD, has chest tubes and drive line  Communication   Communication: No difficulties  Cognition Arousal/Alertness: Awake/alert Behavior During Therapy: WFL for tasks assessed/performed Overall Cognitive Status: Within Functional Limits for tasks  assessed                 General Comments: A & O x 4    General Comments General comments (skin integrity, edema, etc.): chest tubes and drive line intact    Exercises General Exercises - Lower Extremity Ankle Circles/Pumps:  AROM;Both;10 reps;Seated Hip Flexion/Marching: AROM;Both;10 reps;Standing   Assessment/Plan    PT Assessment Patient needs continued PT services  PT Problem List Decreased strength;Decreased activity tolerance;Decreased balance;Decreased mobility;Decreased knowledge of precautions;Pain          PT Treatment Interventions Gait training;Functional mobility training;Therapeutic exercise;Balance training;Patient/family education    PT Goals (Current goals can be found in the Care Plan section)  Acute Rehab PT Goals Patient Stated Goal: I want to go home PT Goal Formulation: With patient Time For Goal Achievement: 12/30/16 Potential to Achieve Goals: Good    Frequency Min 3X/week   Barriers to discharge Decreased caregiver support      Co-evaluation               End of Session Equipment Utilized During Treatment: Oxygen Activity Tolerance: Patient tolerated treatment well;No increased pain Patient left: in chair;with call bell/phone within reach;with nursing/sitter in room Nurse Communication: Mobility status         Time: [id number] PT Time Calculation (min) (ACUTE ONLY): 25 min   Charges:   PT Evaluation $PT Eval Moderate Complexity: 1 Procedure PT Treatments $Therapeutic Activity: 8-22 mins   PT G CodesTracie Harrier 2016/12/21, 12:40 PM  Tracie Harrier, SPT Acute Rehab SPT [phone]

## 2016-12-16 NOTE — Procedures (Signed)
Nitric increased to 5ppm at this time per MD order

## 2016-12-16 NOTE — Progress Notes (Signed)
OT Cancellation Note  Patient Details Name: Trevor [name] MRN: [id number] DOB: June 15, 1946   Cancelled Treatment:    Reason Eval/Treat Not Completed: Patient at procedure or test/ unavailable (Will continue to follow.)  Malka So 12/16/2016, 3:17 PM  [phone]

## 2016-12-17 ENCOUNTER — Inpatient Hospital Stay (HOSPITAL_COMMUNITY): Payer: Medicare Other

## 2016-12-17 ENCOUNTER — Encounter (HOSPITAL_COMMUNITY): Payer: Self-pay | Admitting: Cardiothoracic Surgery

## 2016-12-17 LAB — GLUCOSE, CAPILLARY
GLUCOSE-CAPILLARY: 120 mg/dL — AB (ref 65–99)
GLUCOSE-CAPILLARY: 48 mg/dL — AB (ref 65–99)
GLUCOSE-CAPILLARY: 59 mg/dL — AB (ref 65–99)
GLUCOSE-CAPILLARY: 60 mg/dL — AB (ref 65–99)
GLUCOSE-CAPILLARY: 72 mg/dL (ref 65–99)
GLUCOSE-CAPILLARY: 80 mg/dL (ref 65–99)
Glucose-Capillary: 108 mg/dL — ABNORMAL HIGH (ref 65–99)
Glucose-Capillary: 112 mg/dL — ABNORMAL HIGH (ref 65–99)
Glucose-Capillary: 163 mg/dL — ABNORMAL HIGH (ref 65–99)
Glucose-Capillary: 45 mg/dL — ABNORMAL LOW (ref 65–99)
Glucose-Capillary: 91 mg/dL (ref 65–99)

## 2016-12-17 LAB — POCT I-STAT EG7
Acid-Base Excess: 5 mmol/L — ABNORMAL HIGH (ref 0.0–2.0)
Bicarbonate: 31.6 mmol/L — ABNORMAL HIGH (ref 20.0–28.0)
CALCIUM ION: 1.15 mmol/L (ref 1.15–1.40)
HEMATOCRIT: 30 % — AB (ref 39.0–52.0)
HEMOGLOBIN: 10.2 g/dL — AB (ref 13.0–17.0)
O2 Saturation: 78 %
PCO2 VEN: 57 mmHg (ref 44.0–60.0)
POTASSIUM: 4.1 mmol/L (ref 3.5–5.1)
Patient temperature: 37.3
SODIUM: 137 mmol/L (ref 135–145)
TCO2: 33 mmol/L (ref 0–100)
pH, Ven: 7.353 (ref 7.250–7.430)
pO2, Ven: 46 mmHg — ABNORMAL HIGH (ref 32.0–45.0)

## 2016-12-17 LAB — COMPREHENSIVE METABOLIC PANEL
ALT: 28 U/L (ref 17–63)
AST: 43 U/L — ABNORMAL HIGH (ref 15–41)
Albumin: 3.2 g/dL — ABNORMAL LOW (ref 3.5–5.0)
Alkaline Phosphatase: 65 U/L (ref 38–126)
Anion gap: 10 (ref 5–15)
BUN: 45 mg/dL — ABNORMAL HIGH (ref 6–20)
CO2: 26 mmol/L (ref 22–32)
Calcium: 8.8 mg/dL — ABNORMAL LOW (ref 8.9–10.3)
Chloride: 92 mmol/L — ABNORMAL LOW (ref 101–111)
Creatinine, Ser: 1.98 mg/dL — ABNORMAL HIGH (ref 0.61–1.24)
GFR calc Af Amer: 37 mL/min — ABNORMAL LOW (ref >60)
GFR calc non Af Amer: 32 mL/min — ABNORMAL LOW (ref >60)
Glucose, Bld: 68 mg/dL (ref 65–99)
Potassium: 4 mmol/L (ref 3.5–5.1)
Sodium: 128 mmol/L — ABNORMAL LOW (ref 135–145)
Total Bilirubin: 2.1 mg/dL — ABNORMAL HIGH (ref 0.3–1.2)
Total Protein: 6 g/dL — ABNORMAL LOW (ref 6.5–8.1)

## 2016-12-17 LAB — POCT I-STAT, CHEM 8
BUN: 45 mg/dL — AB (ref 6–20)
CALCIUM ION: 1.22 mmol/L (ref 1.15–1.40)
CHLORIDE: 89 mmol/L — AB (ref 101–111)
CREATININE: 2.2 mg/dL — AB (ref 0.61–1.24)
Glucose, Bld: 152 mg/dL — ABNORMAL HIGH (ref 65–99)
HEMATOCRIT: 25 % — AB (ref 39.0–52.0)
Hemoglobin: 8.5 g/dL — ABNORMAL LOW (ref 13.0–17.0)
Potassium: 4.5 mmol/L (ref 3.5–5.1)
SODIUM: 128 mmol/L — AB (ref 135–145)
TCO2: 27 mmol/L (ref 0–100)

## 2016-12-17 LAB — BLOOD GAS, ARTERIAL
Acid-Base Excess: 2.3 mmol/L — ABNORMAL HIGH (ref 0.0–2.0)
Bicarbonate: 27.2 mmol/L (ref 20.0–28.0)
Drawn by: 41977
O2 Content: 4 L/min
O2 Saturation: 98.6 %
Patient temperature: 98.6
pCO2 arterial: 49.8 mmHg — ABNORMAL HIGH (ref 32.0–48.0)
pH, Arterial: 7.357 (ref 7.350–7.450)
pO2, Arterial: 120 mmHg — ABNORMAL HIGH (ref 83.0–108.0)

## 2016-12-17 LAB — TYPE AND SCREEN
ABO/RH(D): A NEG
Antibody Screen: NEGATIVE
Unit division: 0

## 2016-12-17 LAB — MAGNESIUM: Magnesium: 2.2 mg/dL (ref 1.7–2.4)

## 2016-12-17 LAB — CBC WITH DIFFERENTIAL/PLATELET
Basophils Absolute: 0 10*3/uL (ref 0.0–0.1)
Basophils Relative: 0 %
Eosinophils Absolute: 0.6 10*3/uL (ref 0.0–0.7)
Eosinophils Relative: 4 %
HCT: 26.3 % — ABNORMAL LOW (ref 39.0–52.0)
Hemoglobin: 8.8 g/dL — ABNORMAL LOW (ref 13.0–17.0)
Lymphocytes Relative: 5 %
Lymphs Abs: 0.8 10*3/uL (ref 0.7–4.0)
MCH: 29.7 pg (ref 26.0–34.0)
MCHC: 33.5 g/dL (ref 30.0–36.0)
MCV: 88.9 fL (ref 78.0–100.0)
Monocytes Absolute: 2.3 10*3/uL — ABNORMAL HIGH (ref 0.1–1.0)
Monocytes Relative: 15 %
Neutro Abs: 11.7 10*3/uL — ABNORMAL HIGH (ref 1.7–7.7)
Neutrophils Relative %: 76 %
Platelets: 77 10*3/uL — ABNORMAL LOW (ref 150–400)
RBC: 2.96 MIL/uL — ABNORMAL LOW (ref 4.22–5.81)
RDW: 17.5 % — ABNORMAL HIGH (ref 11.5–15.5)
WBC: 15.4 10*3/uL — ABNORMAL HIGH (ref 4.0–10.5)

## 2016-12-17 LAB — COOXEMETRY PANEL
Carboxyhemoglobin: 1.5 % (ref 0.5–1.5)
Methemoglobin: 1.3 % (ref 0.0–1.5)
O2 Saturation: 76.7 %
Total hemoglobin: 8.7 g/dL — ABNORMAL LOW (ref 12.0–16.0)

## 2016-12-17 LAB — LACTATE DEHYDROGENASE: LDH: 279 U/L — ABNORMAL HIGH (ref 98–192)

## 2016-12-17 LAB — PROTIME-INR
INR: 1.43
Prothrombin Time: 17.6 seconds — ABNORMAL HIGH (ref 11.4–15.2)

## 2016-12-17 LAB — PHOSPHORUS: Phosphorus: 4.4 mg/dL (ref 2.5–4.6)

## 2016-12-17 MED ORDER — SODIUM CHLORIDE 0.9 % IV SOLN
1.0000 g | Freq: Two times a day (BID) | INTRAVENOUS | Status: AC
  Administered 2016-12-18 – 2016-12-24 (×13): 1 g via INTRAVENOUS
  Filled 2016-12-17 (×15): qty 1

## 2016-12-17 MED ORDER — WARFARIN SODIUM 2.5 MG PO TABS
2.5000 mg | ORAL_TABLET | Freq: Every day | ORAL | Status: DC
  Administered 2016-12-17: 2.5 mg via ORAL
  Filled 2016-12-17: qty 1

## 2016-12-17 MED ORDER — FUROSEMIDE 10 MG/ML IJ SOLN
80.0000 mg | Freq: Two times a day (BID) | INTRAMUSCULAR | Status: DC
  Administered 2016-12-17 – 2016-12-20 (×7): 80 mg via INTRAVENOUS
  Filled 2016-12-17 (×8): qty 8

## 2016-12-17 MED ORDER — PIPERACILLIN-TAZOBACTAM 3.375 G IVPB: 3.3750 g | Freq: Three times a day (TID) | INTRAVENOUS | Status: DC

## 2016-12-17 MED ORDER — LEVALBUTEROL HCL 0.63 MG/3ML IN NEBU
0.6300 mg | INHALATION_SOLUTION | Freq: Four times a day (QID) | RESPIRATORY_TRACT | Status: DC | PRN
  Administered 2016-12-17 – 2016-12-28 (×5): 0.63 mg via RESPIRATORY_TRACT
  Filled 2016-12-17 (×5): qty 3

## 2016-12-17 MED ORDER — INSULIN DETEMIR 100 UNIT/ML ~~LOC~~ SOLN
7.0000 [IU] | Freq: Two times a day (BID) | SUBCUTANEOUS | Status: DC
  Filled 2016-12-17 (×3): qty 0.07

## 2016-12-17 MED ORDER — MIDAZOLAM HCL 2 MG/2ML IJ SOLN: 1.0000 mg | INTRAMUSCULAR | Status: DC | PRN

## 2016-12-17 MED ORDER — AMIODARONE HCL 200 MG PO TABS
200.0000 mg | ORAL_TABLET | Freq: Two times a day (BID) | ORAL | Status: DC
  Administered 2016-12-17 – 2016-12-18 (×4): 200 mg via ORAL
  Filled 2016-12-17 (×4): qty 1

## 2016-12-17 MED ORDER — DEXTROSE 50 % IV SOLN
INTRAVENOUS | Status: AC
  Administered 2016-12-17: 50 mL
  Filled 2016-12-17: qty 50

## 2016-12-17 MED ORDER — POTASSIUM CHLORIDE CRYS ER 20 MEQ PO TBCR
40.0000 meq | EXTENDED_RELEASE_TABLET | Freq: Once | ORAL | Status: AC
  Administered 2016-12-17: 40 meq via ORAL
  Filled 2016-12-17: qty 2

## 2016-12-17 NOTE — Care Management Note (Signed)
Case Management Note  Patient Details  Name: Trevor [name] MRN: [id number] Date of Birth: 1946-08-13  Subjective/Objective:   S/p LVAD placement 2/9                 Action/Plan:  PTA independent from home with sister.  LVAD team following closely.  Pt has recommendations for HH - no orders as of yet.  CM will continue to follow for discharge needs   Expected Discharge Date:                  Expected Discharge Plan:  [place]  In-House Referral:     Discharge planning Services  CM Consult  Post Acute Care Choice:    Choice offered to:     DME Arranged:    DME Agency:     HH Arranged:    HH Agency:     Status of Service:  In process, will continue to follow  If discussed at Long Length of Stay Meetings, dates discussed:    Additional Comments:  [name], RN 12/17/2016, 11:10 AM

## 2016-12-17 NOTE — Progress Notes (Signed)
PHARMACY NOTE:  ANTIMICROBIAL RENAL DOSAGE ADJUSTMENT  Current antimicrobial regimen includes a mismatch between antimicrobial dosage and estimated renal function.  As per policy approved by the Pharmacy & Therapeutics and Medical Executive Committees, the antimicrobial dosage will be adjusted accordingly.  Current antimicrobial dosage:  Meropenem 1g IV Q8H  Indication: HAP (TA grew Pseudomonas)  Renal Function: Estimated Creatinine Clearance: 30.9 mL/min (by C-G formula based on SCr of 2.2 mg/dL (H)).     Antimicrobial dosage has been changed to:  Meropenem 1g IV Q12H  Thank you for allowing pharmacy to be a part of this patient's care.  Gwenlyn Perking, PharmD PGY1 Pharmacy Resident Pager: [phone] 12/17/2016 6:30 PM

## 2016-12-17 NOTE — Progress Notes (Signed)
Hypoglycemic Event  CBG: 48  Treatment: D50 IV 50 mL  Symptoms: None  Follow-up CBG: Time: 0056 CBG Result: 91  Possible Reasons for Event: Medication regimen: Levemir 10 units BID may have contributed.  Comments/MD notified: No    Trevor [name]

## 2016-12-17 NOTE — Progress Notes (Signed)
Patient ID: Trevor [name], male   DOB: 07-18-1946, 71 y.o.   MRN: [id number]   SICU Evening Rounds:   Hemodynamically stable  Milrinone 0.25 and epi 2 CVP 16-18 Pump parameters stable: flow 5.5, PI 4.9, speed 9000, power 5.5.  Awake and alert, rattling cough. Says pain is a 7/10.  Urine output good with lasix CT output decreasing  CBC    Component Value Date/Time   WBC 15.4 (H) 12/17/2016 0400   RBC 2.96 (L) 12/17/2016 0400   HGB 8.5 (L) 12/17/2016 1604   HCT 25.0 (L) 12/17/2016 1604   HCT 26.1 (L) 12/08/2016 0334   PLT 77 (L) 12/17/2016 0400   MCV 88.9 12/17/2016 0400   MCH 29.7 12/17/2016 0400   MCHC 33.5 12/17/2016 0400   RDW 17.5 (H) 12/17/2016 0400   LYMPHSABS 0.8 12/17/2016 0400   MONOABS 2.3 (H) 12/17/2016 0400   EOSABS 0.6 12/17/2016 0400   BASOSABS 0.0 12/17/2016 0400     BMET    Component Value Date/Time   NA 128 (L) 12/17/2016 1604   K 4.5 12/17/2016 1604   CL 89 (L) 12/17/2016 1604   CO2 26 12/17/2016 0429   GLUCOSE 152 (H) 12/17/2016 1604   BUN 45 (H) 12/17/2016 1604   CREATININE 2.20 (H) 12/17/2016 1604   CALCIUM 8.8 (L) 12/17/2016 0429   GFRNONAA 32 (L) 12/17/2016 0429   GFRAA 37 (L) 12/17/2016 0429     A/P:  Stable postop course. Continue current plans. Work on IS, continue diuresis.

## 2016-12-17 NOTE — Progress Notes (Signed)
Dressing changed done with sister present at bedside, dressing explained in detail along with informational sheet given from kit. [name] was the sister who observed the dressing change.  Rowe Pavy, RN

## 2016-12-17 NOTE — Progress Notes (Signed)
HeartMate 2 Rounding Note  Postop day 4 implantation HeartMate 2 VAD and tricuspid valve annuloplasty repair  Subjective:   Patient with ischemic cardiomyopathy, severe tricuspid regurgitation, ejection fraction 20% and chronic renal insufficiency. The patient was in cardiogenic shock with severe volume overload and deterioration in renal function on admission. He required inotropes and subsequently balloon pump in order to optimize his hemodynamic and renal status prior to VAD implantation on February 9.  [name] surgery was complicated by moderate RV dysfunction requiring inotropes and coagulopathy requiring platelets and component transfusion. His chest tube drainage the past 24 hours has improved to 500 cc serosanguinous fluid- Hb 8.2 stable, chest x-rays clear. Starting coumadin but holding heparin due to low plts and persistent chest tube output  [name] renal function remains stable with creatinine 1.9 and adequate urine output, transitioned off lasix drip - now 80mg  bid The patient is awake and responsive OOB to chair after being extubated POD #2 - start ambulation today VAD parameters are satisfactory with VAD flow greater than 5 L and PI 5.0-5.5. no PI events  this morning The patient is in chronic atrial fibrillation and has a permanent pacemaker-AICD.  LVAD INTERROGATION:  HeartMate II LVAD:  Flow 5.2 liters/min, speed 9000 RPM, power 5.1, PI 5.8.  Controller intact  Objective:    Vital Signs:   Temp:  [97.5 F (36.4 C)-99.2 F (37.3 C)] 98.2 F (36.8 C) (02/13 0806) Pulse Rate:  [50-97] 92 (02/13 0900) Resp:  [9-21] 20 (02/13 0900) BP: (71-119)/(50-84) 84/50 (02/13 0900) SpO2:  [91 %-100 %] 100 % (02/13 0900) Arterial Line BP: (68-97)/(49-73) 85/70 (02/13 0500) Weight:  [202 lb 9.6 oz (91.9 kg)] 202 lb 9.6 oz (91.9 kg) (02/13 0600) Last BM Date: 12/15/16 Mean arterial Pressure70-51mmHg  Intake/Output:   Intake/Output Summary (Last 24 hours) at 12/17/16  0938 Last data filed at 12/17/16 0900  Gross per 24 hour  Intake          3389.42 ml  Output             2520 ml  Net           869.42 ml     Physical Exam: General:  Well appearing. No resp difficulty, Comfortable in chair HEENT: normal Neck: supple. JVP normal . Carotids trace pulsatility bilat; no bruits. No lymphadenopathy or thryomegaly appreciated. Cor: Mechanical heart sounds with LVAD hum present. Lungs: clear, cough congested Abdomen: soft, nontender, nondistended. No hepatosplenomegaly. No bruits or masses. Good bowel sounds. Extremities: no cyanosis, clubbing, rash, edema Neuro: alert & orientedx3, cranial nerves grossly intact. moves all 4 extremities w/o difficulty. Affect pleasant  Telemetry: Paced 75/m, atrial fibrillation  Labs: Basic Metabolic Panel:  Recent Labs Lab 12/14/16 0346 12/14/16 1630  12/15/16 0230 12/15/16 1524 12/16/16 0330 12/16/16 1632 12/17/16 0400 12/17/16 0429  NA 132* 134*  < > 132* 132* 131* 129*  --  128*  K 4.2 4.4  < > 4.4 4.2 3.6 4.0  --  4.0  CL 95* 98*  < > 95* 91* 94* 91*  --  92*  CO2 26 25  --  26  --  26  --   --  26  GLUCOSE 121* 128*  < > 172* 184* 85 216*  --  68  BUN 32* 33*  < > 32* 35* 38* 39*  --  45*  CREATININE 1.57* 1.77*  < > 1.76* 1.90* 1.85* 2.00*  --  1.98*  CALCIUM 8.3* 8.2*  --  8.1*  --  8.2*  --   --  8.8*  MG 2.7* 2.3  --  2.3  --  2.1  --  2.2  --   PHOS 3.5  --   --  4.2  --  4.3  --  4.4  --   < > = values in this interval not displayed.  Liver Function Tests:  Recent Labs Lab 12/14/16 0346 12/15/16 0230 12/16/16 0330 12/17/16 0429  AST 83* 76* 52* 43*  ALT 24 26 29 28   ALKPHOS 45 48 54 65  BILITOT 5.0* 3.9* 2.7* 2.1*  PROT 5.2* 5.2* 5.6* 6.0*  ALBUMIN 3.4* 3.1* 3.1* 3.2*   No results for input(s): LIPASE, AMYLASE in the last 168 hours. No results for input(s): AMMONIA in the last 168 hours.  CBC:  Recent Labs Lab 12/14/16 0346  12/15/16 0230  12/15/16 1800 12/16/16 0330  12/16/16 1350 12/16/16 1632 12/17/16 0400  WBC 14.4*  < > 16.5*  --  17.2* 16.1* 17.3*  --  15.4*  NEUTROABS 11.2*  --  12.7*  --   --  12.9*  --   --  11.7*  HGB 7.7*  < > 8.6*  < > 8.3* 8.1* 7.8* 7.1* 8.8*  HCT 22.9*  < > 25.4*  < > 23.7* 23.5* 23.2* 21.0* 26.3*  MCV 92.3  < > 87.3  --  87.5 88.3 88.9  --  88.9  PLT 114*  < > 81*  --  77* 79* 74*  --  77*  < > = values in this interval not displayed.  INR:  Recent Labs Lab 12/13/16 2233 12/14/16 0346 12/15/16 0230 12/16/16 0330 12/17/16 0400  INR 1.67 1.04 1.51 1.39 1.43    Other results:  EKG:   Imaging: Dg Chest Port 1 View  Result Date: 12/17/2016 CLINICAL DATA:  Left ventricular assist device. EXAM: PORTABLE CHEST 1 VIEW COMPARISON:  12/16/2016. FINDINGS: Left PICC line, mediastinal drainage catheter, bilateral chest tubes in stable position. No pneumothorax. Cardiac pacer stable position. Prior cardiac valve replacement. Left ventricular assist device in stable position . Stable cardiomegaly. Stable bibasilar atelectasis. Mild infiltrate left lung base cannot be excluded. IMPRESSION: 1. Lines and tubes including bilateral chest tubes in stable position. No pneumothorax. 2. Prior cardiac valve repair. Cardiac pacer left ventricular assist device in stable position. Stable cardiomegaly. 3. Persistent bibasilar atelectasis . Electronically Signed   By: Marcello Moores  Register   On: 12/17/2016 07:27   Dg Chest Port 1 View  Addendum Date: 12/16/2016   ADDENDUM REPORT: 12/16/2016 16:35 ADDENDUM: Clarification of new left PICC tip positioning. The tip is at the upper cavoatrial junction. Electronically Signed   By: Monte Fantasia M.D.   On: 12/16/2016 16:35   Result Date: 12/16/2016 CLINICAL DATA:  PICC insertion EXAM: PORTABLE CHEST 1 VIEW COMPARISON:  Earlier today FINDINGS: New left upper extremity PICC with tip at the upper right atrium. No interval displacement of right IJ sheath or left subclavian central line. Single chamber  ICD/pacer from the right. Thoracic drains and LVAD. Negative for pneumothorax. Low volume chest with stable atelectasis. Stable cardiomegaly. The patient is status post CABG and transcatheter aortic valve replacement. IMPRESSION: New left-sided PICC with tip at the upper right atrium. Otherwise stable from earlier today. Electronically Signed: By: Monte Fantasia M.D. On: 12/16/2016 16:13   Dg Chest Port 1 View  Result Date: 12/16/2016 CLINICAL DATA:  Left ventricular assist device. EXAM: PORTABLE CHEST 1 VIEW COMPARISON:  12/15/2016 FINDINGS: Interim extubation and removal of NG  tube. Left subclavian line stable position. Bilateral chest tubes in stable position. No pneumothorax. Cardiac pacer in stable position. Left ventricular assist device in stable position. Prior CABG. Prior cardiac valve replacement. Cardiomegaly with mild bilateral from interstitial prominence again noted. Small bilateral pleural effusions. IMPRESSION: 1. Interim removal of endotracheal tube and NG tube. Left subclavian line and bilateral chest tubes remain in stable position. No pneumothorax. 2. Prior CABG and cardiac valve replacement. Cardiac pacer stable position. Left ventricular assist device in stable position. Cardiomegaly with mild bilateral interstitial prominence and small pleural effusions consistent with mild CHF. No change prior exam. Electronically Signed   By: Marcello Moores  Register   On: 12/16/2016 07:15     Medications:     Scheduled Medications: . sodium chloride   Intravenous Once  . sodium chloride   Intravenous Once  . amiodarone  200 mg Oral BID  . arformoterol  15 mcg Nebulization BID  . aspirin EC  325 mg Oral Daily   Or  . aspirin  324 mg Per Tube Daily   Or  . aspirin  300 mg Rectal Daily  . bisacodyl  10 mg Oral Daily   Or  . bisacodyl  10 mg Rectal Daily  . budesonide (PULMICORT) nebulizer solution  0.5 mg Nebulization BID  . busPIRone  5 mg Oral BID  . Chlorhexidine Gluconate Cloth  6 each  Topical Daily  . chlorpheniramine-HYDROcodone  5 mL Oral Q12H  . docusate sodium  200 mg Oral Daily  . fluticasone  1 spray Each Nare Daily  . furosemide  80 mg Intravenous BID  . guaiFENesin  600 mg Oral BID  . insulin aspart  0-24 Units Subcutaneous Q4H  . insulin detemir  7 Units Subcutaneous BID  . levothyroxine  25 mcg Oral QAC breakfast  . mouth rinse  15 mL Mouth Rinse BID  . metoCLOPramide (REGLAN) injection  10 mg Intravenous Q6H  . pantoprazole  40 mg Oral Q1200  . sildenafil  20 mg Oral TID  . sodium chloride flush  10-40 mL Intracatheter Q12H  . sodium chloride flush  10-40 mL Intracatheter Q12H  . sodium chloride flush  3 mL Intravenous Q12H  . umeclidinium bromide  1 puff Inhalation Daily  . warfarin  2.5 mg Oral q1800  . Warfarin - Physician Dosing Inpatient   Does not apply q1800    Infusions: . sodium chloride    . sodium chloride 20 mL/hr (12/16/16 1607)  . epinephrine 5 mcg/min (12/17/16 0900)  . lactated ringers 20 mL/hr at 12/13/16 1900  . lactated ringers 20 mL/hr at 12/17/16 0900  . milrinone 0.375 mcg/kg/min (12/17/16 0900)  . norepinephrine (LEVOPHED) Adult infusion Stopped (12/17/16 0801)    PRN Medications: acetaminophen, albuterol, fentaNYL (SUBLIMAZE) injection, midazolam, ondansetron (ZOFRAN) IV, oxyCODONE, sodium chloride flush, sodium chloride flush, sodium chloride flush, traMADol, traZODone   Assessment:  Acute on chronic systolic heart failure from ischemic cardiomyopathy treated with HeartMate 2 implantation for destination therapy indication Moderate RV failure requiring inotropes and inhaled nitric oxide Preoperative acute on chronic renal failure Severe tricuspid regurgitation corrected with 28 mm ring annuloplasty Preoperative COPD-asthmatic bronchitis with low diffusion capacity less than 40% predicted Postoperative coagulopathy and expected acute blood loss anemia Acute liver failure  bilirubin 5.0 postop now improving <  3 Preoperative hyponatremia   Plan/Discussion:   Mobilize with PT  nitric oxide through nasal cannula - wean off Remove Right pleural chest tube today  Coumadin 2.5  mg daily no heparin Continue  Lasix , , keep CVP < 15 Wean pressors as tolerated- wean nitric and reduce milrinone should allow epi and norepi to wean Cont pump speed 9000 Check echo when off epi, norepi  I reviewed the LVAD parameters from today, and compared the results to the patient's prior recorded data.  No programming changes were made.  The LVAD is functioning within specified parameters.  The patient performs LVAD self-test daily.  LVAD interrogation was negative for any significant power changes, alarms or PI events/speed drops.  LVAD equipment check completed and is in good working order.  Back-up equipment present.   LVAD education done on emergency procedures and precautions and reviewed exit site care.  Length of Stay: [place] [facility] 12/17/2016, 9:38 AM

## 2016-12-17 NOTE — Evaluation (Signed)
Occupational Therapy Evaluation Patient Details Name: Trevor [name] MRN: [id number] DOB: Apr 02, 1946 Today's Date: 12/17/2016    History of Present Illness  71 y.o.malewith medical history significant of CHF, CKD, DM2, asthma. s/p LVAD.    Clinical Impression   Pt was independent in ADL, IADL and mobility prior to admission. He presents with decreased activity tolerance and generalized weakness requiring moderate assistance for mobility and set up to max assist for ADL. Instructed pt in sternal precautions and began education in energy conservation. Educated in multiple uses of 3 in 1. Anticipate pt will progress for a safe discharge home with his supportive family. Will follow acutely    Follow Up Recommendations  No OT follow up    Equipment Recommendations  3 in 1 bedside commode    Recommendations for Other Services       Precautions / Restrictions Precautions Precautions: Sternal;Fall Precaution Comments: educated in sternal precautions related to mobility and ADL Restrictions Weight Bearing Restrictions: Yes Other Position/Activity Restrictions: sternal precautions      Mobility Bed Mobility               General bed mobility comments: not assessed, pt in chair  Transfers Overall transfer level: Needs assistance Equipment used: None Transfers: Sit to/from Stand;Stand Pivot Transfers Sit to Stand: Mod assist;+2 safety/equipment Stand pivot transfers: Mod assist;+2 safety/equipment       General transfer comment: cues for technique, used momentum, assist to rise and steady    Balance Overall balance assessment: Needs assistance   Sitting balance-[name]: Good       Standing balance-[name]: Fair                              ADL Overall ADL's : Needs assistance/impaired Eating/Feeding: Set up;Sitting   Grooming: Wash/dry hands;Wash/dry face;Sitting;Set up   Upper Body Bathing: Minimal assistance;Sitting   Lower  Body Bathing: Maximal assistance;Sit to/from stand   Upper Body Dressing : Maximal assistance;Sitting   Lower Body Dressing: Maximal assistance;Sit to/from stand Lower Body Dressing Details (indicate cue type and reason): able to cross foot over opposite knee to doff socks, assist to don Toilet Transfer: Stand-pivot;Moderate assistance;+2 for safety/equipment   Toileting- Clothing Manipulation and Hygiene: Maximal assistance;Sit to/from stand         General ADL Comments: Pt limited by decreased activity tolerance. Began education in energy conservation.      Vision     Perception     Praxis      Pertinent Vitals/Pain Pain Assessment: Faces Faces Pain Scale: Hurts even more Pain Location: incisional pain Pain Descriptors / Indicators: Discomfort     Hand Dominance Right   Extremity/Trunk Assessment Upper Extremity Assessment Upper Extremity Assessment: Overall WFL for tasks assessed (not able to formally assess due to sternal precautions)   Lower Extremity Assessment Lower Extremity Assessment: Defer to PT evaluation   Cervical / Trunk Assessment Cervical / Trunk Exceptions: chest tubes x 2, LVAD drive line   Communication Communication Communication: No difficulties   Cognition Arousal/Alertness: Awake/alert Behavior During Therapy: WFL for tasks assessed/performed Overall Cognitive Status: Within Functional Limits for tasks assessed                 General Comments: pt with low blood sugar, drinking orange juice, somewhat groggy   General Comments       Exercises       Shoulder Instructions      Home Living  Family/patient expects to be discharged to:: Private residence Living Arrangements: Other relatives (sisters) Available Help at Discharge: Family;Available 24 hours/day Type of Home: Other(Comment) (condo) Home Access: Level entry     Home Layout: One level     Bathroom Shower/Tub: Teacher, early years/pre:  Standard Bathroom Accessibility: No   Home Equipment: Shower seat          Prior Functioning/Environment Level of Independence: Independent        Comments: 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, working at golf course, no use of AD, standing to shower but has shower seat available        OT Problem List: Decreased strength;Decreased activity tolerance;Impaired balance (sitting and/or standing);Decreased knowledge of use of DME or AE;Decreased knowledge of precautions;Cardiopulmonary status limiting activity;Pain;Impaired UE functional use   OT Treatment/Interventions: Self-care/ADL training;DME and/or AE instruction;Energy conservation;Therapeutic activities;Patient/family education;Balance training    OT Goals(Current goals can be found in the care plan section) Acute Rehab OT Goals Patient Stated Goal: to live longer OT Goal Formulation: With patient Time For Goal Achievement: 12/31/16 Potential to Achieve Goals: Good ADL Goals Pt Will Perform Grooming: with supervision;standing (3 activities) Pt Will Perform Upper Body Bathing: sitting;with set-up Pt Will Perform Lower Body Bathing: with supervision;sit to/from stand Pt Will Perform Upper Body Dressing: with set-up;sitting Pt Will Perform Lower Body Dressing: with supervision;sit to/from stand Pt Will Transfer to Toilet: with supervision;ambulating;bedside commode (over toilet) Pt Will Perform Toileting - Clothing Manipulation and hygiene: with supervision;sit to/from stand Additional ADL Goal #1: Pt will state at least 3 energy conservation strategies as instructed. Additional ADL Goal #2: Pt will independently LVAD switch power source and manage vest.  OT Frequency: Min 3X/week   Barriers to D/C:            Co-evaluation              End of Session    Activity Tolerance: Patient tolerated treatment well Patient left: in chair;with call bell/phone within reach;with nursing/sitter in room   Time: 0811-0831 OT Time  Calculation (min): 20 min Charges:  OT General Charges $OT Visit: 1 Procedure OT Evaluation $OT Eval High Complexity: 1 Procedure G-Codes:    Malka So 12/17/2016, 8:48 AM  [phone]

## 2016-12-17 NOTE — Progress Notes (Signed)
Patient is off nitric at this time and tolerating well.

## 2016-12-17 NOTE — Progress Notes (Signed)
Hypoglycemic Event  CBG: 60  Treatment: 15 GM carbohydrate snack  Symptoms: Pale  Follow-up CBG: [id number] CBG Result:80  Possible Reasons for Event: Unknown  Comments/MD notified: Dr. Prescott Gum made aware

## 2016-12-17 NOTE — Progress Notes (Signed)
Hypoglycemic Event  CBG: 59  Treatment: D50 IV 50 mL  Symptoms: Shaky, Nervous  Follow-up CBG: Time: 0240 CBG Result: 112  Possible Reasons for Event: Medication regimen: Suspect Levemir 10 units BID  Comments/MD notified: No    Dorene Grebe F

## 2016-12-17 NOTE — Progress Notes (Signed)
Patient ID: Trevor [name], male   DOB: 1945/11/25, 71 y.o.   MRN: [id number]    Advanced Heart Failure Rounding Note   Subjective:    Admitted with marked volume overload and low output HF.  Echo EF 20-25. Failed dual inotropes. IABP placed 2/7.   Underwent HM-II placement and TV repair on 2/9.   POD #3 today  Extubated, stable.  Off NO. On norepi 1.5, epi 5, milrinone 0.375.  Speed increased to 9000 rpm 2/12 to facilitate weaning pressors. Co-ox 77%.   MAP 70s.  I/Os even yesterday, weight up but suspect done in bed.  CVP 14 when in bed.  Now up in chair.  Had hypoglycemic event last night.  No dyspnea at rest.  Did not sleep well.  Some surgical site pain but tolerable.   Chest tubes in, less drainage today.   1 unit PRBCs 2/12.   LVAD parameters:   Speed 9000  Flow 6 PI 4.8 Power 5.7.  No PI events since yesterday morning.   Objective:   Weight Range:  Vital Signs:   Temp:  [97.5 F (36.4 C)-99.2 F (37.3 C)] 98.3 F (36.8 C) (02/13 0400) Pulse Rate:  [48-97] 92 (02/13 0700) Resp:  [9-21] 17 (02/13 0700) BP: (71-138)/(51-120) 99/78 (02/13 0700) SpO2:  [91 %-100 %] 100 % (02/13 0700) Arterial Line BP: (68-100)/(49-73) 85/70 (02/13 0500) Weight:  [202 lb 9.6 oz (91.9 kg)] 202 lb 9.6 oz (91.9 kg) (02/13 0600) Last BM Date: 12/15/16  Weight change: Filed Weights   12/15/16 0442 12/16/16 0311 12/17/16 0600  Weight: 194 lb 10.7 oz (88.3 kg) 197 lb 12 oz (89.7 kg) 202 lb 9.6 oz (91.9 kg)    Intake/Output:   Intake/Output Summary (Last 24 hours) at 12/17/16 0735 Last data filed at 12/17/16 0700  Gross per 24 hour  Intake          2961.02 ml  Output             2500 ml  Net           461.02 ml     Physical Exam: General:  NAD, in chair.  HEENT: normal   Neck: supple, JVP 10 cm.  Cor: Sternal dressing in place. +CTs. LVAD hum  Lungs: CTAB  Abdomen: obese soft, NT. Hypoactive BS. Driveline site ok Extremities: no cyanosis, clubbing, rash.  trace edema.  IABP site ok  Neuro: Alert/oriented.  GU: Foley   Telemetry: Reviewed,  V-pacing, rate around 90.   Labs: Basic Metabolic Panel:  Recent Labs Lab 12/14/16 0346 12/14/16 1630  12/15/16 0230 12/15/16 1524 12/16/16 0330 12/16/16 1632 12/17/16 0400 12/17/16 0429  NA 132* 134*  < > 132* 132* 131* 129*  --  128*  K 4.2 4.4  < > 4.4 4.2 3.6 4.0  --  4.0  CL 95* 98*  < > 95* 91* 94* 91*  --  92*  CO2 26 25  --  26  --  26  --   --  26  GLUCOSE 121* 128*  < > 172* 184* 85 216*  --  68  BUN 32* 33*  < > 32* 35* 38* 39*  --  45*  CREATININE 1.57* 1.77*  < > 1.76* 1.90* 1.85* 2.00*  --  1.98*  CALCIUM 8.3* 8.2*  --  8.1*  --  8.2*  --   --  8.8*  MG 2.7* 2.3  --  2.3  --  2.1  --  2.2  --  PHOS 3.5  --   --  4.2  --  4.3  --  4.4  --   < > = values in this interval not displayed.  Liver Function Tests:  Recent Labs Lab 12/14/16 0346 12/15/16 0230 12/16/16 0330 12/17/16 0429  AST 83* 76* 52* 43*  ALT 24 26 29 28   ALKPHOS 45 48 54 65  BILITOT 5.0* 3.9* 2.7* 2.1*  PROT 5.2* 5.2* 5.6* 6.0*  ALBUMIN 3.4* 3.1* 3.1* 3.2*   No results for input(s): LIPASE, AMYLASE in the last 168 hours. No results for input(s): AMMONIA in the last 168 hours.  CBC:  Recent Labs Lab 12/14/16 0346  12/15/16 0230  12/15/16 1800 12/16/16 0330 12/16/16 1350 12/16/16 1632 12/17/16 0400  WBC 14.4*  < > 16.5*  --  17.2* 16.1* 17.3*  --  15.4*  NEUTROABS 11.2*  --  12.7*  --   --  12.9*  --   --  11.7*  HGB 7.7*  < > 8.6*  < > 8.3* 8.1* 7.8* 7.1* 8.8*  HCT 22.9*  < > 25.4*  < > 23.7* 23.5* 23.2* 21.0* 26.3*  MCV 92.3  < > 87.3  --  87.5 88.3 88.9  --  88.9  PLT 114*  < > 81*  --  77* 79* 74*  --  77*  < > = values in this interval not displayed.  Cardiac Enzymes:  Recent Labs Lab 12/12/16 0918  TROPONINI 0.06*    BNP: BNP (last 3 results)  Recent Labs  11/28/16 1104 12/14/16 0348  BNP 1,825.3* 427.7*    ProBNP (last 3 results)  Recent Labs  11/27/16 1207  PROBNP 1,746.0*        Other results:  Imaging: Dg Chest Port 1 View  Result Date: 12/17/2016 CLINICAL DATA:  Left ventricular assist device. EXAM: PORTABLE CHEST 1 VIEW COMPARISON:  12/16/2016. FINDINGS: Left PICC line, mediastinal drainage catheter, bilateral chest tubes in stable position. No pneumothorax. Cardiac pacer stable position. Prior cardiac valve replacement. Left ventricular assist device in stable position . Stable cardiomegaly. Stable bibasilar atelectasis. Mild infiltrate left lung base cannot be excluded. IMPRESSION: 1. Lines and tubes including bilateral chest tubes in stable position. No pneumothorax. 2. Prior cardiac valve repair. Cardiac pacer left ventricular assist device in stable position. Stable cardiomegaly. 3. Persistent bibasilar atelectasis . Electronically Signed   By: Marcello Moores  Register   On: 12/17/2016 07:27   Dg Chest Port 1 View  Addendum Date: 12/16/2016   ADDENDUM REPORT: 12/16/2016 16:35 ADDENDUM: Clarification of new left PICC tip positioning. The tip is at the upper cavoatrial junction. Electronically Signed   By: Monte Fantasia M.D.   On: 12/16/2016 16:35   Result Date: 12/16/2016 CLINICAL DATA:  PICC insertion EXAM: PORTABLE CHEST 1 VIEW COMPARISON:  Earlier today FINDINGS: New left upper extremity PICC with tip at the upper right atrium. No interval displacement of right IJ sheath or left subclavian central line. Single chamber ICD/pacer from the right. Thoracic drains and LVAD. Negative for pneumothorax. Low volume chest with stable atelectasis. Stable cardiomegaly. The patient is status post CABG and transcatheter aortic valve replacement. IMPRESSION: New left-sided PICC with tip at the upper right atrium. Otherwise stable from earlier today. Electronically Signed: By: Monte Fantasia M.D. On: 12/16/2016 16:13   Dg Chest Port 1 View  Result Date: 12/16/2016 CLINICAL DATA:  Left ventricular assist device. EXAM: PORTABLE CHEST 1 VIEW COMPARISON:  12/15/2016 FINDINGS:  Interim extubation and removal of NG tube. Left subclavian  line stable position. Bilateral chest tubes in stable position. No pneumothorax. Cardiac pacer in stable position. Left ventricular assist device in stable position. Prior CABG. Prior cardiac valve replacement. Cardiomegaly with mild bilateral from interstitial prominence again noted. Small bilateral pleural effusions. IMPRESSION: 1. Interim removal of endotracheal tube and NG tube. Left subclavian line and bilateral chest tubes remain in stable position. No pneumothorax. 2. Prior CABG and cardiac valve replacement. Cardiac pacer stable position. Left ventricular assist device in stable position. Cardiomegaly with mild bilateral interstitial prominence and small pleural effusions consistent with mild CHF. No change prior exam. Electronically Signed   By: Marcello Moores  Register   On: 12/16/2016 07:15   Dg Chest Port 1 View  Result Date: 12/15/2016 CLINICAL DATA:  LVAD. EXAM: PORTABLE CHEST 1 VIEW COMPARISON:  12/14/2016. FINDINGS: Stable cardiomegaly, post CABG, LVAD, and valve replacement. Stable RIGHT-sided pacer. Stable support apparatus. Improving basilar atelectasis. No pneumothorax or edema. IMPRESSION: Improved aeration.  No acute abnormalities. Electronically Signed   By: Staci Righter M.D.   On: 12/15/2016 09:15     Medications:     Scheduled Medications: . sodium chloride   Intravenous Once  . sodium chloride   Intravenous Once  . arformoterol  15 mcg Nebulization BID  . aspirin EC  325 mg Oral Daily   Or  . aspirin  324 mg Per Tube Daily   Or  . aspirin  300 mg Rectal Daily  . bisacodyl  10 mg Oral Daily   Or  . bisacodyl  10 mg Rectal Daily  . budesonide (PULMICORT) nebulizer solution  0.5 mg Nebulization BID  . busPIRone  5 mg Oral BID  . Chlorhexidine Gluconate Cloth  6 each Topical Daily  . chlorpheniramine-HYDROcodone  5 mL Oral Q12H  . docusate sodium  200 mg Oral Daily  . fluticasone  1 spray Each Nare Daily  .  furosemide  80 mg Intravenous BID  . guaiFENesin  600 mg Oral BID  . insulin aspart  0-24 Units Subcutaneous Q4H  . insulin detemir  7 Units Subcutaneous BID  . levothyroxine  25 mcg Oral QAC breakfast  . mouth rinse  15 mL Mouth Rinse BID  . metoCLOPramide (REGLAN) injection  10 mg Intravenous Q6H  . pantoprazole  40 mg Oral Q1200  . potassium chloride  40 mEq Oral Once  . sildenafil  20 mg Oral TID  . sodium chloride flush  10-40 mL Intracatheter Q12H  . sodium chloride flush  10-40 mL Intracatheter Q12H  . sodium chloride flush  3 mL Intravenous Q12H  . umeclidinium bromide  1 puff Inhalation Daily  . warfarin  2 mg Oral q1800  . Warfarin - Physician Dosing Inpatient   Does not apply q1800    Infusions: . sodium chloride    . sodium chloride 20 mL/hr (12/16/16 1607)  . amiodarone 30 mg/hr (12/17/16 0700)  . epinephrine 5 mcg/min (12/17/16 0700)  . lactated ringers 20 mL/hr at 12/13/16 1900  . lactated ringers 20 mL/hr at 12/17/16 0700  . milrinone 0.375 mcg/kg/min (12/17/16 0700)  . norepinephrine (LEVOPHED) Adult infusion 1.5 mcg/min (12/17/16 0700)  . vasopressin (PITRESSIN) infusion - *FOR SHOCK* Stopped (12/15/16 1800)    PRN Medications: acetaminophen, albuterol, fentaNYL (SUBLIMAZE) injection, midazolam, ondansetron (ZOFRAN) IV, oxyCODONE, sodium chloride flush, sodium chloride flush, sodium chloride flush, traMADol, traZODone   Assessment/Plan/Discussion    1. Acute on chronic systolic CHF: Ischemic cardiomyopathy but with prominent RV dysfunction on echo and exam.  [employer] ICD.  Echo  this admission showed EF 20-25% with moderately dilated LV and moderately dilated/moderately dysfunctional RV with severe TR.  He was admitted with low output biventricular failure, cardiorenal syndrome, and marked volume overload. Failed dual inotrope support. IABP placed 2/7. HM-II and TV repair performed 2/9 after discussion with [facility] transplant team. POD #3 HM-II and TV repair.   Extubated 2/11, off NO and vasopressin. Speed increased to 9000 rpm to facilitate weaning pressors. CVP around 14 when in bed, co-ox 77%, MAP 70s.  - Continue milrinone 0.375.  Think we can turn off norepinephrine today, then work on epinephrine.  - Would give Lasix 80 mg IV bid today, no metolazone.   - Sildenafil begun at 20 mg tid for RV failure.  Now off NO.  - Warfarin started.  Continue ASA 325 until INR > 2 then decrease to 81.  2. CAD: s/p CABG.  No signs/sx of ischemia.  3. AKI on CKD stage III: Suspect cardiorenal syndrome in setting of low output failure/RV failure.  Creatinine slightly lower today but BUN higher after getting metolazone + IV Lasix yesterday.  Will give IV Lasix 80 mg bid today without metolazone.  4. Atrial fibrillation: Chronic.  Rate is not elevated.  - Anticoagulation per surgical team. Warfarin has been started.  5. Post-op anemia: Also component of anemia of renal disease/chronic disease.  He got 1 unit PRBCs yesterday, appropriate increase in hgb.  6. Acute liver failure, post-op: Bili improving 5.0 -> 3.9 -> 2.7 -> 2.1. Likely hypoperfusion. Holding hepato-toxic agents.  7. Aortic stenosis s/p TAVR: Stable on echo.  8. ID: Afebrile.  WBCs elevated but lower.  Continue surveillance, pending respiratory cultures.  9. Thrombocytopenia: Stable plts at 77. 10. Hypoglycemia: Will cut back on insulin a bit.  11. Hyponatremia: Hypervolemic hyponatremia, worse with metolazone yesterday.  Fluid restrict.    35 minutes critical care time.   Loralie Champagne, MD  12/17/2016, 7:35 AM  Advanced Heart Failure Team Pager [phone] (M-F; 7a - 4p)  Please contact [facility] for night-coverage after hours (4p -7a ) and weekends on [email or web address]

## 2016-12-17 NOTE — Progress Notes (Signed)
Physical Therapy Treatment Patient Details Name: Trevor [name] MRN: [id number] DOB: [birth year] Today's Date: 12/17/2016    History of Present Illness  71 y.o.malewith medical history significant of CHF, CKD, DM2, asthma. s/p LVAD.     PT Comments    Pt progressing well toward goals. Pt tolerated ambulation today with minA physically, but continues to have decreased activity tolerance requiring frequent standing rest breaks. Pt educated on transfer of power to battery and equipment setup. Pt did not perform at this time. Pt would continue to benefit from current d/c plan. Will continue to follow.   Vitals: BP pre: 90/47 post: 98/76; sats 100% on 2l/nitric-varied and controlled by RT throughout Flow: 5.8-6.0 Pi: 4.9-5.4; power: 9,000; HR: 86-92   Follow Up Recommendations  Home health PT;Supervision for mobility/OOB     Equipment Recommendations  None recommended by PT    Recommendations for Other Services       Precautions / Restrictions Precautions Precautions: Sternal;Fall Precaution Comments: reviewed sternal precautions Restrictions Weight Bearing Restrictions: Yes Other Position/Activity Restrictions: sternal precautions; no push/pull bilateral UE    Mobility  Bed Mobility Overal bed mobility: Needs Assistance Bed Mobility: Sit to Supine       Sit to supine: Mod assist;+2 for safety/equipment   General bed mobility comments: pt in chair upon arrival, assist for line management and physical assist to bring bilateral LE on bed  Transfers Overall transfer level: Needs assistance Equipment used: Rolling walker (2 wheeled) Transfers: Sit to/from Stand Sit to Stand: Min assist        General transfer comment: min physical assist to rise due to inability to use bilateral UE due to sternal precautions  Ambulation/Gait Ambulation/Gait assistance: Min assist;+2 safety/equipment Ambulation Distance (Feet): 150 Feet Assistive device: Rolling walker (2  wheeled) Gait Pattern/deviations: Decreased step length - right;Decreased step length - left;Decreased stride length;Step-through pattern;Wide base of support;Trunk flexed Gait velocity: decreased Gait velocity interpretation: Below normal speed for age/gender General Gait Details: slow speed, frequent standing rest breaks   Stairs            Wheelchair Mobility    Modified Rankin (Stroke Patients Only)       Balance Overall balance assessment: Needs assistance Sitting-balance support: Feet supported;No upper extremity supported Sitting balance-[name]: Good Sitting balance - Comments: sat EOB x 5 min without LOB   Standing balance support: Bilateral upper extremity supported Standing balance-[name]: Fair Standing balance comment: not reliant upon RW for static standing breaks during ambulation but heavily reliant upon RW for ambulation and with fatigue                    Cognition Arousal/Alertness: Awake/alert Behavior During Therapy: WFL for tasks assessed/performed Overall Cognitive Status: Within Functional Limits for tasks assessed                 General Comments: A&Ox4, able to recall sternal precautions    Exercises      General Comments General comments (skin integrity, edema, etc.): chest tubes intact; nitric oxide      Pertinent Vitals/Pain Pain Assessment: 0-10 Pain Score: 10-Worst pain ever (after ambulation, RN present) Faces Pain Scale: Hurts even more Pain Location: L chest Pain Descriptors / Indicators: Discomfort Pain Intervention(s): Limited activity within patient's tolerance;Monitored during session;Repositioned    Home Living Family/patient expects to be discharged to:: Private residence Living Arrangements: Other relatives (sisters) Available Help at Discharge: Family;Available 24 hours/day Type of Home: Other(Comment) (condo) Home Access: Level entry  Home Layout: One level Home Equipment: Shower seat       Prior Function Level of Independence: Independent      Comments: 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, working at golf course, 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 of AD, standing to shower but has shower seat available   PT Goals (current goals can now be found in the care plan section) Acute Rehab PT Goals Patient Stated Goal: to live longer Progress towards PT goals: Progressing toward goals    Frequency    Min 3X/week      PT Plan Current plan remains appropriate    Co-evaluation             End of Session Equipment Utilized During Treatment: Gait belt;Oxygen (4L O2) Activity Tolerance: Patient tolerated treatment well Patient left: in bed;with call bell/phone within reach;with nursing/sitter in room     Time: 1025-1104 PT Time Calculation (min) (ACUTE ONLY): 39 min  Charges:  $Gait Training: 23-37 mins $Therapeutic Activity: 8-22 mins                    G Codes:      Tracie Harrier 2017-01-10, 11:25 AM   Tracie Harrier, SPT Acute Rehab SPT [phone]

## 2016-12-17 NOTE — Progress Notes (Signed)
Pt CBG 72 @ 0357, which is a significant drop from 112 @ 0240. Pt given orange juice and graham crackers. Will continue to monitor closely.  Sherlie Ban, RN

## 2016-12-18 ENCOUNTER — Inpatient Hospital Stay (HOSPITAL_COMMUNITY): Payer: Medicare Other

## 2016-12-18 ENCOUNTER — Ambulatory Visit: Payer: Medicare Other | Admitting: Endocrinology

## 2016-12-18 DIAGNOSIS — Z95811 Presence of heart assist device: Secondary | ICD-10-CM

## 2016-12-18 DIAGNOSIS — I5023 Acute on chronic systolic (congestive) heart failure: Secondary | ICD-10-CM

## 2016-12-18 DIAGNOSIS — R570 Cardiogenic shock: Secondary | ICD-10-CM

## 2016-12-18 LAB — COMPREHENSIVE METABOLIC PANEL
ALT: 35 U/L (ref 17–63)
AST: 47 U/L — ABNORMAL HIGH (ref 15–41)
Albumin: 2.9 g/dL — ABNORMAL LOW (ref 3.5–5.0)
Alkaline Phosphatase: 88 U/L (ref 38–126)
Anion gap: 12 (ref 5–15)
BUN: 47 mg/dL — ABNORMAL HIGH (ref 6–20)
CO2: 27 mmol/L (ref 22–32)
Calcium: 8.6 mg/dL — ABNORMAL LOW (ref 8.9–10.3)
Chloride: 89 mmol/L — ABNORMAL LOW (ref 101–111)
Creatinine, Ser: 2.14 mg/dL — ABNORMAL HIGH (ref 0.61–1.24)
GFR calc Af Amer: 34 mL/min — ABNORMAL LOW (ref >60)
GFR calc non Af Amer: 29 mL/min — ABNORMAL LOW (ref >60)
Glucose, Bld: 122 mg/dL — ABNORMAL HIGH (ref 65–99)
Potassium: 4 mmol/L (ref 3.5–5.1)
Sodium: 128 mmol/L — ABNORMAL LOW (ref 135–145)
Total Bilirubin: 1.8 mg/dL — ABNORMAL HIGH (ref 0.3–1.2)
Total Protein: 5.6 g/dL — ABNORMAL LOW (ref 6.5–8.1)

## 2016-12-18 LAB — CBC
HCT: 25.7 % — ABNORMAL LOW (ref 39.0–52.0)
Hemoglobin: 8.7 g/dL — ABNORMAL LOW (ref 13.0–17.0)
MCH: 30.5 pg (ref 26.0–34.0)
MCHC: 33.9 g/dL (ref 30.0–36.0)
MCV: 90.2 fL (ref 78.0–100.0)
Platelets: 105 10*3/uL — ABNORMAL LOW (ref 150–400)
RBC: 2.85 MIL/uL — ABNORMAL LOW (ref 4.22–5.81)
RDW: 17.3 % — ABNORMAL HIGH (ref 11.5–15.5)
WBC: 10.9 10*3/uL — ABNORMAL HIGH (ref 4.0–10.5)

## 2016-12-18 LAB — CBC WITH DIFFERENTIAL/PLATELET
Basophils Absolute: 0 10*3/uL (ref 0.0–0.1)
Basophils Relative: 0 %
Eosinophils Absolute: 0.6 10*3/uL (ref 0.0–0.7)
Eosinophils Relative: 5 %
HCT: 25.8 % — ABNORMAL LOW (ref 39.0–52.0)
Hemoglobin: 8.6 g/dL — ABNORMAL LOW (ref 13.0–17.0)
Lymphocytes Relative: 9 %
Lymphs Abs: 1.1 10*3/uL (ref 0.7–4.0)
MCH: 30 pg (ref 26.0–34.0)
MCHC: 33.3 g/dL (ref 30.0–36.0)
MCV: 89.9 fL (ref 78.0–100.0)
Monocytes Absolute: 1.5 10*3/uL — ABNORMAL HIGH (ref 0.1–1.0)
Monocytes Relative: 13 %
Neutro Abs: 8.4 10*3/uL — ABNORMAL HIGH (ref 1.7–7.7)
Neutrophils Relative %: 72 %
Platelets: 92 10*3/uL — ABNORMAL LOW (ref 150–400)
RBC: 2.87 MIL/uL — ABNORMAL LOW (ref 4.22–5.81)
RDW: 17.3 % — ABNORMAL HIGH (ref 11.5–15.5)
WBC: 11.6 10*3/uL — ABNORMAL HIGH (ref 4.0–10.5)

## 2016-12-18 LAB — POCT I-STAT, CHEM 8
BUN: 33 mg/dL — ABNORMAL HIGH (ref 6–20)
CALCIUM ION: 1.22 mmol/L (ref 1.15–1.40)
CHLORIDE: 93 mmol/L — AB (ref 101–111)
Creatinine, Ser: 1.5 mg/dL — ABNORMAL HIGH (ref 0.61–1.24)
Glucose, Bld: 107 mg/dL — ABNORMAL HIGH (ref 65–99)
HCT: 20 % — ABNORMAL LOW (ref 39.0–52.0)
Hemoglobin: 6.8 g/dL — CL (ref 13.0–17.0)
Potassium: 3.8 mmol/L (ref 3.5–5.1)
SODIUM: 128 mmol/L — AB (ref 135–145)
TCO2: 21 mmol/L (ref 0–100)

## 2016-12-18 LAB — GLUCOSE, CAPILLARY
GLUCOSE-CAPILLARY: 112 mg/dL — AB (ref 65–99)
GLUCOSE-CAPILLARY: 122 mg/dL — AB (ref 65–99)
GLUCOSE-CAPILLARY: 140 mg/dL — AB (ref 65–99)
Glucose-Capillary: 136 mg/dL — ABNORMAL HIGH (ref 65–99)
Glucose-Capillary: 147 mg/dL — ABNORMAL HIGH (ref 65–99)
Glucose-Capillary: 166 mg/dL — ABNORMAL HIGH (ref 65–99)

## 2016-12-18 LAB — APTT
APTT: 46 s — AB (ref 24–36)
APTT: 113 s — AB (ref 24–36)
aPTT: 118 seconds — ABNORMAL HIGH (ref 24–36)

## 2016-12-18 LAB — PROTIME-INR
INR: 1.58
INR: 1.99
Prothrombin Time: 19 seconds — ABNORMAL HIGH (ref 11.4–15.2)
Prothrombin Time: 22.9 seconds — ABNORMAL HIGH (ref 11.4–15.2)

## 2016-12-18 LAB — MAGNESIUM: Magnesium: 1.9 mg/dL (ref 1.7–2.4)

## 2016-12-18 LAB — ECHOCARDIOGRAM COMPLETE
Height: 63 in
Weight: 3125.24 oz

## 2016-12-18 LAB — COOXEMETRY PANEL
Carboxyhemoglobin: 2.1 % — ABNORMAL HIGH (ref 0.5–1.5)
Methemoglobin: 0.9 % (ref 0.0–1.5)
O2 Saturation: 79.1 %
Total hemoglobin: 8.6 g/dL — ABNORMAL LOW (ref 12.0–16.0)

## 2016-12-18 LAB — LACTATE DEHYDROGENASE: LDH: 268 U/L — ABNORMAL HIGH (ref 98–192)

## 2016-12-18 LAB — HEPARIN INDUCED PLATELET AB (HIT ANTIBODY): Heparin Induced Plt Ab: 0.506 OD — ABNORMAL HIGH (ref 0.000–0.400)

## 2016-12-18 MED ORDER — SODIUM CHLORIDE 0.9 % IV SOLN
0.0400 mg/kg/h | INTRAVENOUS | Status: DC
  Administered 2016-12-18: 0.04 mg/kg/h via INTRAVENOUS
  Filled 2016-12-18: qty 250

## 2016-12-18 MED ORDER — EPINEPHRINE PF 1 MG/ML IJ SOLN
0.0000 ug/min | INTRAMUSCULAR | Status: DC
  Filled 2016-12-18: qty 4

## 2016-12-18 MED ORDER — BIVALIRUDIN 250 MG IV SOLR
0.0200 mg/kg/h | INTRAVENOUS | Status: DC
  Administered 2016-12-18: 0.02 mg/kg/h via INTRAVENOUS
  Filled 2016-12-18: qty 250

## 2016-12-18 MED ORDER — WARFARIN SODIUM 5 MG PO TABS
5.0000 mg | ORAL_TABLET | Freq: Every day | ORAL | Status: DC
  Administered 2016-12-18: 5 mg via ORAL
  Filled 2016-12-18: qty 1

## 2016-12-18 MED ORDER — SILDENAFIL CITRATE 20 MG PO TABS
20.0000 mg | ORAL_TABLET | Freq: Two times a day (BID) | ORAL | Status: DC
  Administered 2016-12-18 – 2016-12-23 (×10): 20 mg via ORAL
  Filled 2016-12-18 (×11): qty 1

## 2016-12-18 MED ORDER — SORBITOL 70 % SOLN
30.0000 mL | Freq: Once | Status: AC
  Administered 2016-12-18: 30 mL via ORAL
  Filled 2016-12-18: qty 30

## 2016-12-18 MED ORDER — BISACODYL 10 MG RE SUPP
10.0000 mg | Freq: Once | RECTAL | Status: AC
  Administered 2016-12-18: 10 mg via RECTAL

## 2016-12-18 MED ORDER — GABAPENTIN 600 MG PO TABS
300.0000 mg | ORAL_TABLET | Freq: Every day | ORAL | Status: DC
  Administered 2016-12-18 – 2016-12-29 (×12): 300 mg via ORAL
  Filled 2016-12-18 (×13): qty 1

## 2016-12-18 MED FILL — Heparin Sodium (Porcine) Inj 1000 Unit/ML: INTRAMUSCULAR | Qty: 20 | Status: AC

## 2016-12-18 MED FILL — Sodium Bicarbonate IV Soln 8.4%: INTRAVENOUS | Qty: 50 | Status: AC

## 2016-12-18 MED FILL — Sodium Chloride IV Soln 0.9%: INTRAVENOUS | Qty: 3000 | Status: AC

## 2016-12-18 MED FILL — Calcium Chloride Inj 10%: INTRAVENOUS | Qty: 10 | Status: AC

## 2016-12-18 MED FILL — Electrolyte-R (PH 7.4) Solution: INTRAVENOUS | Qty: 5000 | Status: AC

## 2016-12-18 MED FILL — Mannitol IV Soln 20%: INTRAVENOUS | Qty: 500 | Status: AC

## 2016-12-18 NOTE — Progress Notes (Signed)
ANTICOAGULATION CONSULT NOTE - Follow Up Consult  Pharmacy Consult for Bivalirudin Indication: atrial fibrillation/LVAD  Allergies  Allergen Reactions  . No Known Allergies     Patient Measurements: Height: 5\' 3"  (160 cm) Weight: 195 lb 5.2 oz (88.6 kg) IBW/kg (Calculated) : 56.9  Vital Signs: Temp: 98.7 F (37.1 C) (02/14 1500) Temp Source: Oral (02/14 1500) BP: 85/62 (02/14 1700) Pulse Rate: 80 (02/14 1800)  Labs:  Recent Labs  12/17/16 0400  12/17/16 1604 12/18/16 0412 12/18/16 1339 12/18/16 1657 12/18/16 1715  HGB 8.8*  --  8.5* 8.6*  --  6.8* 8.7*  HCT 26.3*  --  25.0* 25.8*  --  20.0* 25.7*  PLT 77*  --   --  92*  --   --  105*  APTT  --   --   --   --  46*  --  118*  LABPROT 17.6*  --   --  19.0* 22.9*  --   --   INR 1.43  --   --  1.58 1.99  --   --   CREATININE  --   < > 2.20* 2.14*  --  1.50*  --   < > = values in this interval not displayed.  Estimated Creatinine Clearance: 44.5 mL/min (by C-G formula based on SCr of 1.5 mg/dL (H)).   Medications:  Heparin @ 1250 units/hr  Assessment: 70yom on apixaban pta for afib (last dose 1/31 @ 2110), transitioned to IV heparin for LVAD placement (2/9).   Patient now POD #5 on warfarin. INR still low this morning at 1.58. Will add bivalirudin until INR at least over 2.  PM aPTT elevated at 118 seconds  Goal of Therapy:  aPTT 50-75 seconds per PVT Monitor platelets by anticoagulation protocol: Yes   Plan:  1) Decrease bival  to 0.04 mg/kg/hr  2) Recheck aptt this evening  Thank you Anette Guarneri, PharmD [phone] 12/18/2016 6:24 PM

## 2016-12-18 NOTE — Progress Notes (Signed)
Patient ID: Trevor [name], male   DOB: Mar 10, 1946, 71 y.o.   MRN: [id number]    Advanced Heart Failure Rounding Note   Subjective:    Admitted with marked volume overload and low output HF.  Echo EF 20-25. Failed dual inotropes. IABP placed 2/7.   Underwent HM-II placement and TV repair on 2/9.   POD #4 today  Extubated, stable.  Off NO. Off norepinephrine.  Epinephrine 2, milrinone 0.375.  Speed increased to 9000 rpm 2/12 to facilitate weaning pressors. Co-ox 79%.   MAP 70s.  I/Os negative and weight down.  CVP remains elevated.  Slept better.  1 chest tube out. Surgical site pain a little worse.  1 unit PRBCs 2/12.   Respiratory cultures with Pseudomonas, meropenem started.  Afebrile and WBCs coming down.   LVAD parameters:   Speed 9000  Flow 5.8 PI 4.3 Power 5.6.  4 PI events overnight.   Objective:   Weight Range:  Vital Signs:   Temp:  [97.6 F (36.4 C)-98.9 F (37.2 C)] 97.6 F (36.4 C) (02/14 0400) Pulse Rate:  [41-96] 79 (02/14 0600) Resp:  [10-22] 13 (02/14 0600) BP: (73-99)/(34-84) 87/72 (02/14 0600) SpO2:  [92 %-100 %] 100 % (02/14 0800) Weight:  [195 lb 5.2 oz (88.6 kg)] 195 lb 5.2 oz (88.6 kg) (02/14 0600) Last BM Date: 12/15/16  Weight change: Filed Weights   12/16/16 0311 12/17/16 0600 12/18/16 0600  Weight: 197 lb 12 oz (89.7 kg) 202 lb 9.6 oz (91.9 kg) 195 lb 5.2 oz (88.6 kg)    Intake/Output:   Intake/Output Summary (Last 24 hours) at 12/18/16 0803 Last data filed at 12/18/16 0700  Gross per 24 hour  Intake          1501.21 ml  Output             2795 ml  Net         -1293.79 ml     Physical Exam: General:  NAD, in chair.  HEENT: normal   Neck: supple, JVP 10 cm.  Cor: Sternal dressing in place. +CTs. LVAD hum  Lungs: CTAB  Abdomen: obese soft, NT. Hypoactive BS. Driveline site ok Extremities: no cyanosis, clubbing, rash.  trace edema. IABP site ok  Neuro: Alert/oriented.  GU: Foley   Telemetry: Reviewed,  V-pacing, rate around  90.   Labs: Basic Metabolic Panel:  Recent Labs Lab 12/14/16 0346 12/14/16 1630  12/15/16 0230  12/16/16 0330 12/16/16 1632 12/17/16 0400 12/17/16 0429 12/17/16 1604 12/18/16 0412  NA 132* 134*  < > 132*  < > 131* 129*  --  128* 128* 128*  K 4.2 4.4  < > 4.4  < > 3.6 4.0  --  4.0 4.5 4.0  CL 95* 98*  < > 95*  < > 94* 91*  --  92* 89* 89*  CO2 26 25  --  26  --  26  --   --  26  --  27  GLUCOSE 121* 128*  < > 172*  < > 85 216*  --  68 152* 122*  BUN 32* 33*  < > 32*  < > 38* 39*  --  45* 45* 47*  CREATININE 1.57* 1.77*  < > 1.76*  < > 1.85* 2.00*  --  1.98* 2.20* 2.14*  CALCIUM 8.3* 8.2*  --  8.1*  --  8.2*  --   --  8.8*  --  8.6*  MG 2.7* 2.3  --  2.3  --  2.1  --  2.2  --   --  1.9  PHOS 3.5  --   --  4.2  --  4.3  --  4.4  --   --   --   < > = values in this interval not displayed.  Liver Function Tests:  Recent Labs Lab 12/14/16 0346 12/15/16 0230 12/16/16 0330 12/17/16 0429 12/18/16 0412  AST 83* 76* 52* 43* 47*  ALT 24 26 29 28  35  ALKPHOS 45 48 54 65 88  BILITOT 5.0* 3.9* 2.7* 2.1* 1.8*  PROT 5.2* 5.2* 5.6* 6.0* 5.6*  ALBUMIN 3.4* 3.1* 3.1* 3.2* 2.9*   No results for input(s): LIPASE, AMYLASE in the last 168 hours. No results for input(s): AMMONIA in the last 168 hours.  CBC:  Recent Labs Lab 12/14/16 0346  12/15/16 0230  12/15/16 1800 12/16/16 0330 12/16/16 1350 12/16/16 1632 12/17/16 0400 12/17/16 1604 12/18/16 0412  WBC 14.4*  < > 16.5*  --  17.2* 16.1* 17.3*  --  15.4*  --  11.6*  NEUTROABS 11.2*  --  12.7*  --   --  12.9*  --   --  11.7*  --  8.4*  HGB 7.7*  < > 8.6*  < > 8.3* 8.1* 7.8* 7.1* 8.8* 8.5* 8.6*  HCT 22.9*  < > 25.4*  < > 23.7* 23.5* 23.2* 21.0* 26.3* 25.0* 25.8*  MCV 92.3  < > 87.3  --  87.5 88.3 88.9  --  88.9  --  89.9  PLT 114*  < > 81*  --  77* 79* 74*  --  77*  --  92*  < > = values in this interval not displayed.  Cardiac Enzymes:  Recent Labs Lab 12/12/16 0918  TROPONINI 0.06*    BNP: BNP (last 3  results)  Recent Labs  11/28/16 1104 12/14/16 0348  BNP 1,825.3* 427.7*    ProBNP (last 3 results)  Recent Labs  11/27/16 1207  PROBNP 1,746.0*      Other results:  Imaging: Dg Chest Port 1 View  Result Date: 12/17/2016 CLINICAL DATA:  Left ventricular assist device. EXAM: PORTABLE CHEST 1 VIEW COMPARISON:  12/16/2016. FINDINGS: Left PICC line, mediastinal drainage catheter, bilateral chest tubes in stable position. No pneumothorax. Cardiac pacer stable position. Prior cardiac valve replacement. Left ventricular assist device in stable position . Stable cardiomegaly. Stable bibasilar atelectasis. Mild infiltrate left lung base cannot be excluded. IMPRESSION: 1. Lines and tubes including bilateral chest tubes in stable position. No pneumothorax. 2. Prior cardiac valve repair. Cardiac pacer left ventricular assist device in stable position. Stable cardiomegaly. 3. Persistent bibasilar atelectasis . Electronically Signed   By: Marcello Moores  Register   On: 12/17/2016 07:27   Dg Chest Port 1 View  Addendum Date: 12/16/2016   ADDENDUM REPORT: 12/16/2016 16:35 ADDENDUM: Clarification of new left PICC tip positioning. The tip is at the upper cavoatrial junction. Electronically Signed   By: Monte Fantasia M.D.   On: 12/16/2016 16:35   Result Date: 12/16/2016 CLINICAL DATA:  PICC insertion EXAM: PORTABLE CHEST 1 VIEW COMPARISON:  Earlier today FINDINGS: New left upper extremity PICC with tip at the upper right atrium. No interval displacement of right IJ sheath or left subclavian central line. Single chamber ICD/pacer from the right. Thoracic drains and LVAD. Negative for pneumothorax. Low volume chest with stable atelectasis. Stable cardiomegaly. The patient is status post CABG and transcatheter aortic valve replacement. IMPRESSION: New left-sided PICC with tip at the upper right atrium. Otherwise stable  from earlier today. Electronically Signed: By: Monte Fantasia M.D. On: 12/16/2016 16:13      Medications:     Scheduled Medications: . sodium chloride   Intravenous Once  . sodium chloride   Intravenous Once  . amiodarone  200 mg Oral BID  . arformoterol  15 mcg Nebulization BID  . aspirin EC  325 mg Oral Daily   Or  . aspirin  324 mg Per Tube Daily   Or  . aspirin  300 mg Rectal Daily  . bisacodyl  10 mg Oral Daily   Or  . bisacodyl  10 mg Rectal Daily  . budesonide (PULMICORT) nebulizer solution  0.5 mg Nebulization BID  . busPIRone  5 mg Oral BID  . Chlorhexidine Gluconate Cloth  6 each Topical Daily  . chlorpheniramine-HYDROcodone  5 mL Oral Q12H  . docusate sodium  200 mg Oral Daily  . fluticasone  1 spray Each Nare Daily  . furosemide  80 mg Intravenous BID  . guaiFENesin  600 mg Oral BID  . insulin aspart  0-24 Units Subcutaneous Q4H  . insulin detemir  7 Units Subcutaneous BID  . levothyroxine  25 mcg Oral QAC breakfast  . mouth rinse  15 mL Mouth Rinse BID  . meropenem (MERREM) IV  1 g Intravenous Q12H  . metoCLOPramide (REGLAN) injection  10 mg Intravenous Q6H  . pantoprazole  40 mg Oral Q1200  . sildenafil  20 mg Oral TID  . sodium chloride flush  10-40 mL Intracatheter Q12H  . sodium chloride flush  10-40 mL Intracatheter Q12H  . sodium chloride flush  3 mL Intravenous Q12H  . umeclidinium bromide  1 puff Inhalation Daily  . warfarin  2.5 mg Oral q1800  . Warfarin - Physician Dosing Inpatient   Does not apply q1800    Infusions: . sodium chloride    . sodium chloride 20 mL/hr (12/16/16 1607)  . epinephrine 2 mcg/min (12/18/16 0700)  . lactated ringers 20 mL/hr at 12/13/16 1900  . lactated ringers 20 mL/hr at 12/18/16 0700  . milrinone 0.25 mcg/kg/min (12/18/16 0700)  . norepinephrine (LEVOPHED) Adult infusion Stopped (12/17/16 0801)    PRN Medications: acetaminophen, fentaNYL (SUBLIMAZE) injection, levalbuterol, midazolam, ondansetron (ZOFRAN) IV, oxyCODONE, sodium chloride flush, sodium chloride flush, sodium chloride flush, traMADol,  traZODone   Assessment/Plan/Discussion    1. Acute on chronic systolic CHF: Ischemic cardiomyopathy but with prominent RV dysfunction on echo and exam.  [employer] ICD.  Echo this admission showed EF 20-25% with moderately dilated LV and moderately dilated/moderately dysfunctional RV with severe TR.  He was admitted with low output biventricular failure, cardiorenal syndrome, and marked volume overload. Failed dual inotrope support. IABP placed 2/7. HM-II and TV repair performed 2/9 after discussion with [facility] transplant team. POD #3 HM-II and TV repair.  Extubated 2/11, off NO, norepinephrine, and vasopressin. Speed increased to 9000 rpm to facilitate weaning pressors. Still volume overloaded by exam and CVP, co-ox 79%, MAP 70s.  - Continue milrinone 0.375.  Wean down epinephrine today.  - Would give Lasix 80 mg IV bid again today for gentle diuresis.  - Sildenafil begun at 20 mg tid for RV failure.  Now off NO.  - Warfarin started.  Continue ASA 325 until INR > 2 then decrease to 81.  2. CAD: s/p CABG.  No signs/sx of ischemia.  3. AKI on CKD stage III: Suspect cardiorenal syndrome in setting of low output failure/RV failure.  Creatinine stable.   4. Atrial fibrillation: Chronic.  Rate is not elevated.  - Anticoagulation per surgical team. Warfarin has been started.  5. Post-op anemia: Also component of anemia of renal disease/chronic disease.  He got 1 unit PRBCs 2/12, appropriate increase in hgb, hgb stable.  6. Acute liver failure, post-op: Bili improving 5.0 -> 3.9 -> 2.7 -> 2.1 -> 1.8. Likely hypoperfusion. Holding hepato-toxic agents.  7. Aortic stenosis s/p TAVR: Stable on echo.  8. ID: Afebrile.  WBCs elevated but lower again today.  ?mild infiltrate left base, Pseudomonas in respiratory culture.  Covering with meropenem for PNA.  9. Thrombocytopenia: Platelets rising. 10. Hyponatremia: Hypervolemic hyponatremia.  Fluid restrict.  \ 11. Pocket site pain: Will add gabapentin.   35 minutes  critical care time.   Loralie Champagne, MD  12/18/2016, 8:03 AM  Advanced Heart Failure Team Pager [phone] (M-F; 7a - 4p)  Please contact [facility] for night-coverage after hours (4p -7a ) and weekends on [email or web address]

## 2016-12-18 NOTE — Progress Notes (Signed)
LVAD Coordinator Rounding Note:   HeartMate II LVAD implated on 12/13/16 by Dr. Prescott Gum for Destination Therapy.  Primary Cardiologist: Aundra Dubin    LVAD interrogation reveals:  Speed: 8990 Flow: 5.6 Power: 5.5w PI: 5.3 Alarms: none Events: none Fixed speed: 9000 Low speed limit: 8400  Drive Line: Clean, dry and intact being changed daily by nursing staff. Please save gloves to begin practicing sterile technique with [name] / Edd Fabian.   Labs:  LDH trend: 326 > 296 > 279 > 268   INR trend: 1.51 > 1.39 > 1.43  Anticoagulation Plan: -INR Goal: 2 - 2.5 -ASA Dose: 325 mg started 12/14/16 (reduce to 81 mg when INR therapeutic)  Adverse Events on VAD: -none  Education / Discharge Plan:  Education provided today regarding system controller including care and maintenance, daily self-testing requirement and how to perform, overview of buttons and functions, green arrows, power requirements and emergency internal battery. Patient understood and was able to return demonstration with self-test as well as switching power sources successfully with minimal guidance.   Education provided on VAD Parameters and what they mean. Discussed daily routine of logging numbers and establishing his 'baseline.'   Will meet with family at the bedside tomorrow to continue education about controller and dressing change requirement. Nursing to support education early post op.   [name], RN VAD Coordinator   Office: [phone] 24/7 Emergency VAD Pager: [phone]

## 2016-12-18 NOTE — Progress Notes (Addendum)
  Admitted 11/27/2016 due to acute decompensated heart failure.   HeartMate II LVAD implated on 12/13/2016 by Dr Darcey Nora as HM2 VAD.     LVAD interrogation reveals:  Speed:  9000 Flow: 5.8 Power:5.6 PI: 4 overnight Alarms: none Events: none Fixed speed:  9000 Low speed limit: 8400  Drive Line:  Existing VAD dressing removed and site care performed using sterile technique. Drive line exit site cleaned with Chlora prep applicators x 2, allowed to dry, and gauze dressing with aquacell gauze strips re-applied. Exit site red and dry, the velour is fully implanted at exit site. No  drainage, foul odor, or rash noted. Drive line anchor re-applied.    Labs:  Hgb:  8.6  WBC:  11.6  LDH trend:  260-320  INR trend:  1.99 on 2/14  Co-ox: 79.1 on 2/14  Creatinine: 2.14    Arrhythmias: Afib chronic, Vfib in the OR  Gtts: milrinone  Nitric oxide: off  Anticoagulation: Coumadin started-- 2/11, Bival started 2/14  ASA started--2/10, switch to 81 mg when INR at goal INR Goal-- 2-2.5   Plan/Recommendations as discussed with team: 1. VAD Education scheduled today but patient very sleepy and unable to participate. Sister, Trevor [name], has already left for the day. Will attempt education tomorrow and arrange time with caregivers. Nursing to start sterile technique dressing care at the bedside.

## 2016-12-18 NOTE — Progress Notes (Signed)
  Echocardiogram 2D Echocardiogram with LVAD has been performed.  [name] 12/18/2016, 2:10 PM

## 2016-12-18 NOTE — Progress Notes (Signed)
Pharmacy Heparin Induced Thrombocytopenia (HIT) Note:  Trevor [name] is a 71 y.o. male being evaluated for HIT. Heparin was originally started 2/1 for afib while apixaban on hold, and baseline platelets were 136. HIT labs were ordered on 2/13 when platelets dropped to 74.   Heparin Induced Plt Ab  Date/Time Value Ref Range Status  12/17/2016 11:32 AM 0.506 (H) 0.000 - 0.400 OD Final    Comment:    (NOTE) Performed At: [facility] [place], [place] [id number] Lindon Romp MD [id number]      Score = 0 Score = 1 Score = 2 Calculated Score  Thrombocytopenia -Platelet fall < 30% -Any platelet fall with nadir < 10 -Platelet fall >50% BUT surgery within 3 days  -Any combination of platelet fall/nadir that does not fit score 0 or 2 -Platelet fall >50% AND nadir ?20 AND no surgery within 3 days     0  Timing -Platelets fall on days ? 4  AND no prior heparin exposure in previous 100 days -Consistent w/ platelet fall on days 5-10 but missing counts -Platelet fall within 1 day of start AND prior exposure to heparin within previous 31-100 days -Platelet fall after day 10 -Platelet fall 5-10 days after starting heparin -Platelet fall within 1 day of heparin start AND prior exposure within previous 5-30 days 0 Platelet fall occurred post surgery  Thrombosis -None suspected -Recurrent thrombosis in patient receiving therapeutic anticoagulants -Suspected thrombosis (awaiting confirmation via imaging) -Erythematous skin lesions at heparin injection sites -Confirmed new thrombosis -Skin necrosis at injection site -Anaphylactoid reaction to heparin IV bolus -Adrenal hemorrhage 0  oTher Causes -Probable other cause (see protocol for full list) -Possible other cause evident (see protocol for full list) -No alternative explanation 0 Within 72 hours of surgery  Total Calculated 4T Score:      0   Name of MD Contacted: Discussed anticoagulation with Dr.Van  Trigt  Algorithm Recommendation: HIT very unlikely based on 4T score and platelet drop correlating to surgery. MD prefers to continue bivalirudin in this patient due to presence of LVAD. Will continue to follow along.   Plan: Will not order SRA with low likelihood of HIT+ Continue bivalirudin per MD orders at this time, plan to discontinue once INR closer to 2  Erin Hearing PharmD., BCPS Clinical Pharmacist Pager [phone] 12/18/2016 1:45 PM

## 2016-12-18 NOTE — Plan of Care (Signed)
Problem: Activity: Goal: Risk for activity intolerance will decrease Outcome: Progressing Pt walked 150 ft and responded well  Problem: Respiratory: Goal: Ability to maintain adequate ventilation will improve Outcome: Progressing Room air wit SATS 95-100

## 2016-12-18 NOTE — Progress Notes (Signed)
ANTICOAGULATION CONSULT NOTE - Follow Up Consult  Pharmacy Consult for Bivalirudin Indication: atrial fibrillation/LVAD  Allergies  Allergen Reactions  . No Known Allergies     Patient Measurements: Height: 5\' 3"  (160 cm) Weight: 195 lb 5.2 oz (88.6 kg) IBW/kg (Calculated) : 56.9  Vital Signs: Temp: 98.3 F (36.8 C) (02/14 1100) Temp Source: Oral (02/14 0806) BP: 85/67 (02/14 1400) Pulse Rate: 46 (02/14 1400)  Labs:  Recent Labs  12/16/16 1350  12/17/16 0400 12/17/16 0429 12/17/16 1604 12/18/16 0412 12/18/16 1339  HGB 7.8*  < > 8.8*  --  8.5* 8.6*  --   HCT 23.2*  < > 26.3*  --  25.0* 25.8*  --   PLT 74*  --  77*  --   --  92*  --   APTT  --   --   --   --   --   --  46*  LABPROT  --   --  17.6*  --   --  19.0* 22.9*  INR  --   --  1.43  --   --  1.58 1.99  CREATININE  --   < >  --  1.98* 2.20* 2.14*  --   < > = values in this interval not displayed.  Estimated Creatinine Clearance: 31.2 mL/min (by C-G formula based on SCr of 2.14 mg/dL (H)).   Medications:  Heparin @ 1250 units/hr  Assessment: 70yom on apixaban pta for afib (last dose 1/31 @ 2110), transitioned to IV heparin for LVAD placement (2/9).   Patient now POD #5 on warfarin. INR still low this morning at 1.58. Will add bivalirudin until INR at least over 2.  Initial aptt on bival is 46 below goal of 50-75s per discussion with Dr. Prescott Gum. INR has also increased from 1.58>1.99 in relatively short period so would expect a large amount of that increase to be due to bival. Will increase bival rate slightly and recheck aptt this evening.   Platelet count is low but it is very correlated to patient being post-op as the platelet count was stable while on heparin pre-op.   Goal of Therapy:  aPTT 50-75 seconds per PVT Monitor platelets by anticoagulation protocol: Yes   Plan:  1) Increase bival to 0.05 mg/kg/hr 2) Recheck aptt this evening  Erin Hearing PharmD., BCPS Clinical Pharmacist Pager  [phone] 12/18/2016 2:34 PM

## 2016-12-18 NOTE — Progress Notes (Signed)
HeartMate 2 Rounding Note  Postop day 5 implantation HeartMate 2 VAD and tricuspid valve annuloplasty repair  Subjective:   Patient with ischemic cardiomyopathy, severe tricuspid regurgitation, ejection fraction 20% and chronic renal insufficiency. The patient was in cardiogenic shock with severe volume overload and deterioration in renal function on admission. He required inotropes and subsequently balloon pump in order to optimize his hemodynamic and renal status prior to VAD implantation on February 9.  [name] surgery was complicated by moderate RV dysfunction requiring inotropes and coagulopathy requiring platelets and component transfusion. His chest tube drainage the past 24 hours has improved to 500 cc serosanguinous fluid- Hb 8.2 stable, chest x-rays clear. Starting coumadin but holding heparin due to low plts and persistent chest tube output  Nitric oxide weaned off, norepinephrine weaned off, weaning off epinephrine today Maintaining current milrinone at 0.25 LVAD parameters stable with 2 PI events around midnight last night Remains in atrial fibrillation with HeartMate 2-INR still approaching therapeutic levels so we'll start low-dose bivalirudin. Avoid heparin because of significant postop thrombocytopenia and HiT antibody test pending LVAD INTERROGATION:  HeartMate II LVAD:  Flow 5.2 liters/min, speed 9000 RPM, power 5.1, PI 5.8.  Controller intact  Patient continues to have a rattly cough-sputum cultures now positive for Pseudomonas and meropenem started yesterday  Chest tube drainage continues to decline and we'll remove all tubes except for pocket drain today       Vital Signs:   Temp:  [97.6 F (36.4 C)-98.9 F (37.2 C)] 98.8 F (37.1 C) (02/14 0806) Pulse Rate:  [41-96] 87 (02/14 0806) Resp:  [10-22] 10 (02/14 0900) BP: (73-104)/(34-84) 83/69 (02/14 0900) SpO2:  [92 %-100 %] 100 % (02/14 0806) Weight:  [195 lb 5.2 oz (88.6 kg)] 195 lb 5.2 oz (88.6 kg) (02/14  0600) Last BM Date: 12/15/16 Mean arterial Pressure70-12mmHg  Intake/Output:   Intake/Output Summary (Last 24 hours) at 12/18/16 0935 Last data filed at 12/18/16 0700  Gross per 24 hour  Intake          1195.21 ml  Output             2635 ml  Net         -1439.79 ml     Physical Exam: General:  Well appearing. No resp difficulty, Comfortable in chair HEENT: normal Neck: supple. JVP normal . Carotids trace pulsatility bilat; no bruits. No lymphadenopathy or thryomegaly appreciated. Cor: Mechanical heart sounds with LVAD hum present. Lungs: clear, cough congested Abdomen: soft, nontender, nondistended. No hepatosplenomegaly. No bruits or masses. Good bowel sounds. Extremities: no cyanosis, clubbing, rash, edema Neuro: alert & orientedx3, cranial nerves grossly intact. moves all 4 extremities w/o difficulty. Affect pleasant  Telemetry: Paced 75/m, atrial fibrillation  Labs: Basic Metabolic Panel:  Recent Labs Lab 12/14/16 0346 12/14/16 1630  12/15/16 0230  12/16/16 0330 12/16/16 1632 12/17/16 0400 12/17/16 0429 12/17/16 1604 12/18/16 0412  NA 132* 134*  < > 132*  < > 131* 129*  --  128* 128* 128*  K 4.2 4.4  < > 4.4  < > 3.6 4.0  --  4.0 4.5 4.0  CL 95* 98*  < > 95*  < > 94* 91*  --  92* 89* 89*  CO2 26 25  --  26  --  26  --   --  26  --  27  GLUCOSE 121* 128*  < > 172*  < > 85 216*  --  68 152* 122*  BUN 32* 33*  < >  32*  < > 38* 39*  --  45* 45* 47*  CREATININE 1.57* 1.77*  < > 1.76*  < > 1.85* 2.00*  --  1.98* 2.20* 2.14*  CALCIUM 8.3* 8.2*  --  8.1*  --  8.2*  --   --  8.8*  --  8.6*  MG 2.7* 2.3  --  2.3  --  2.1  --  2.2  --   --  1.9  PHOS 3.5  --   --  4.2  --  4.3  --  4.4  --   --   --   < > = values in this interval not displayed.  Liver Function Tests:  Recent Labs Lab 12/14/16 0346 12/15/16 0230 12/16/16 0330 12/17/16 0429 12/18/16 0412  AST 83* 76* 52* 43* 47*  ALT 24 26 29 28  35  ALKPHOS 45 48 54 65 88  BILITOT 5.0* 3.9* 2.7* 2.1* 1.8*   PROT 5.2* 5.2* 5.6* 6.0* 5.6*  ALBUMIN 3.4* 3.1* 3.1* 3.2* 2.9*   No results for input(s): LIPASE, AMYLASE in the last 168 hours. No results for input(s): AMMONIA in the last 168 hours.  CBC:  Recent Labs Lab 12/14/16 0346  12/15/16 0230  12/15/16 1800 12/16/16 0330 12/16/16 1350 12/16/16 1632 12/17/16 0400 12/17/16 1604 12/18/16 0412  WBC 14.4*  < > 16.5*  --  17.2* 16.1* 17.3*  --  15.4*  --  11.6*  NEUTROABS 11.2*  --  12.7*  --   --  12.9*  --   --  11.7*  --  8.4*  HGB 7.7*  < > 8.6*  < > 8.3* 8.1* 7.8* 7.1* 8.8* 8.5* 8.6*  HCT 22.9*  < > 25.4*  < > 23.7* 23.5* 23.2* 21.0* 26.3* 25.0* 25.8*  MCV 92.3  < > 87.3  --  87.5 88.3 88.9  --  88.9  --  89.9  PLT 114*  < > 81*  --  77* 79* 74*  --  77*  --  92*  < > = values in this interval not displayed.  INR:  Recent Labs Lab 12/14/16 0346 12/15/16 0230 12/16/16 0330 12/17/16 0400 12/18/16 0412  INR 1.04 1.51 1.39 1.43 1.58    Other results:  EKG:   Imaging: Dg Chest Port 1 View  Result Date: 12/18/2016 CLINICAL DATA:  Postop left ventricular assist device EXAM: PORTABLE CHEST 1 VIEW COMPARISON:  12/17/2016 FINDINGS: Cardiomediastinal silhouette is stable. Single lead cardiac pacemaker is unchanged in position. Status post median sternotomy. Cardiac valve prosthesis again noted. Left ventricular assist device is unchanged in position. Stable left chest tube position. The right chest tube has been removed. There is small right apical pneumothorax about 5%. No infiltrate or pulmonary edema. Bilateral basilar atelectasis again noted. IMPRESSION: Status post median sternotomy. Cardiac valve prosthesis again noted. Left ventricular assist device is unchanged in position. Stable left chest tube position. The right chest tube has been removed. There is small right apical pneumothorax about 5%. No infiltrate or pulmonary edema. Bilateral basilar atelectasis again noted. Electronically Signed   By: Lahoma Crocker M.D.   On:  12/18/2016 08:51   Dg Chest Port 1 View  Result Date: 12/17/2016 CLINICAL DATA:  Left ventricular assist device. EXAM: PORTABLE CHEST 1 VIEW COMPARISON:  12/16/2016. FINDINGS: Left PICC line, mediastinal drainage catheter, bilateral chest tubes in stable position. No pneumothorax. Cardiac pacer stable position. Prior cardiac valve replacement. Left ventricular assist device in stable position . Stable cardiomegaly. Stable bibasilar atelectasis. Mild  infiltrate left lung base cannot be excluded. IMPRESSION: 1. Lines and tubes including bilateral chest tubes in stable position. No pneumothorax. 2. Prior cardiac valve repair. Cardiac pacer left ventricular assist device in stable position. Stable cardiomegaly. 3. Persistent bibasilar atelectasis . Electronically Signed   By: Marcello Moores  Register   On: 12/17/2016 07:27   Dg Chest Port 1 View  Addendum Date: 12/16/2016   ADDENDUM REPORT: 12/16/2016 16:35 ADDENDUM: Clarification of new left PICC tip positioning. The tip is at the upper cavoatrial junction. Electronically Signed   By: Monte Fantasia M.D.   On: 12/16/2016 16:35   Result Date: 12/16/2016 CLINICAL DATA:  PICC insertion EXAM: PORTABLE CHEST 1 VIEW COMPARISON:  Earlier today FINDINGS: New left upper extremity PICC with tip at the upper right atrium. No interval displacement of right IJ sheath or left subclavian central line. Single chamber ICD/pacer from the right. Thoracic drains and LVAD. Negative for pneumothorax. Low volume chest with stable atelectasis. Stable cardiomegaly. The patient is status post CABG and transcatheter aortic valve replacement. IMPRESSION: New left-sided PICC with tip at the upper right atrium. Otherwise stable from earlier today. Electronically Signed: By: Monte Fantasia M.D. On: 12/16/2016 16:13     Medications:     Scheduled Medications: . sodium chloride   Intravenous Once  . sodium chloride   Intravenous Once  . amiodarone  200 mg Oral BID  . arformoterol  15  mcg Nebulization BID  . aspirin EC  325 mg Oral Daily   Or  . aspirin  324 mg Per Tube Daily   Or  . aspirin  300 mg Rectal Daily  . bisacodyl  10 mg Oral Daily   Or  . bisacodyl  10 mg Rectal Daily  . bisacodyl  10 mg Rectal Once  . budesonide (PULMICORT) nebulizer solution  0.5 mg Nebulization BID  . busPIRone  5 mg Oral BID  . Chlorhexidine Gluconate Cloth  6 each Topical Daily  . chlorpheniramine-HYDROcodone  5 mL Oral Q12H  . docusate sodium  200 mg Oral Daily  . fluticasone  1 spray Each Nare Daily  . furosemide  80 mg Intravenous BID  . gabapentin  300 mg Oral Daily  . guaiFENesin  600 mg Oral BID  . insulin aspart  0-24 Units Subcutaneous Q4H  . levothyroxine  25 mcg Oral QAC breakfast  . mouth rinse  15 mL Mouth Rinse BID  . meropenem (MERREM) IV  1 g Intravenous Q12H  . metoCLOPramide (REGLAN) injection  10 mg Intravenous Q6H  . pantoprazole  40 mg Oral Q1200  . sildenafil  20 mg Oral BID  . sodium chloride flush  10-40 mL Intracatheter Q12H  . sodium chloride flush  10-40 mL Intracatheter Q12H  . sodium chloride flush  3 mL Intravenous Q12H  . sorbitol  30 mL Oral Once  . umeclidinium bromide  1 puff Inhalation Daily  . warfarin  5 mg Oral q1800  . Warfarin - Physician Dosing Inpatient   Does not apply q1800    Infusions: . sodium chloride    . sodium chloride 20 mL/hr (12/16/16 1607)  . bivalirudin (ANGIOMAX) infusion 0.5 mg/mL (Non-ACS indications)    . lactated ringers 20 mL/hr at 12/13/16 1900  . lactated ringers 20 mL/hr at 12/18/16 0700  . milrinone 0.25 mcg/kg/min (12/18/16 0700)  . norepinephrine (LEVOPHED) Adult infusion Stopped (12/17/16 0801)    PRN Medications: acetaminophen, fentaNYL (SUBLIMAZE) injection, levalbuterol, midazolam, ondansetron (ZOFRAN) IV, oxyCODONE, sodium chloride flush, sodium chloride flush,  sodium chloride flush, traMADol, traZODone   Assessment:  Acute on chronic systolic heart failure from ischemic cardiomyopathy treated  with HeartMate 2 implantation for destination therapy indication Moderate RV failure requiring inotropes and inhaled nitric oxide Preoperative acute on chronic renal failure Severe tricuspid regurgitation corrected with 28 mm ring annuloplasty Preoperative COPD-asthmatic bronchitis with low diffusion capacity less than 40% predicted Postoperative coagulopathy and expected acute blood loss anemia Acute liver failure  bilirubin 5.0 postop now improving < 3 Preoperative hyponatremia Postoperative HCAP Gram-negative rod-Pseudomonas from tracheal aspirate  Plan/Discussion:   Mobilize with PT  Remove 2 chest tubes and give laxative for bowel movement Continue twice a day IV Lasix Start low-dose bivalirudin until INR reaches 2.0 Continue ambulation One week of IV antibiotics for gram-negative tracheal- bronchitis with heavy airway secretions Check echo when off epi,   I reviewed the LVAD parameters from today, and compared the results to the patient's prior recorded data.  No programming changes were made.  The LVAD is functioning within specified parameters.  The patient performs LVAD self-test daily.  LVAD interrogation was negative for any significant power changes, alarms or PI events/speed drops.  LVAD equipment check completed and is in good working order.  Back-up equipment present.   LVAD education done on emergency procedures and precautions and reviewed exit site care.  Length of Stay: [place] III 12/18/2016, 9:35 AM

## 2016-12-18 NOTE — Progress Notes (Signed)
ANTICOAGULATION CONSULT NOTE - Follow Up Consult  Pharmacy Consult for Bivalirudin Indication: atrial fibrillation/LVAD  Allergies  Allergen Reactions  . No Known Allergies     Patient Measurements: Height: 5\' 3"  (160 cm) Weight: 195 lb 5.2 oz (88.6 kg) IBW/kg (Calculated) : 56.9  Vital Signs: Temp: 98.8 F (37.1 C) (02/14 1937) Temp Source: Oral (02/14 1937) BP: 103/82 (02/14 1900) Pulse Rate: 81 (02/14 1900)  Labs:  Recent Labs  12/17/16 0400  12/17/16 1604 12/18/16 0412 12/18/16 1339 12/18/16 1657 12/18/16 1715 12/18/16 2010  HGB 8.8*  --  8.5* 8.6*  --  6.8* 8.7*  --   HCT 26.3*  --  25.0* 25.8*  --  20.0* 25.7*  --   PLT 77*  --   --  92*  --   --  105*  --   APTT  --   --   --   --  46*  --  118* 113*  LABPROT 17.6*  --   --  19.0* 22.9*  --   --   --   INR 1.43  --   --  1.58 1.99  --   --   --   CREATININE  --   < > 2.20* 2.14*  --  1.50*  --   --   < > = values in this interval not displayed.  Estimated Creatinine Clearance: 44.5 mL/min (by C-G formula based on SCr of 1.5 mg/dL (H)).   Medications:  Heparin @ 1250 units/hr  Assessment: 70yom on apixaban pta for afib (last dose 1/31 @ 2110), transitioned to IV heparin for LVAD placement (2/9).   Patient now POD #5 on warfarin. INR still low this morning at 1.58. Will add bivalirudin until INR at least over 2.  aPTT elevated at 118 seconds at 1800 pm, decreased back to 0.04 mg/kg/hr APTT elevated at 113 seconds at 2100 pm, hold for 1 hour, decrease to 0.02 mg / kg / hr  Goal of Therapy:  aPTT 50-75 seconds per PVT Monitor platelets by anticoagulation protocol: Yes   Plan:  1) Decrease bival  to 0.02 mg/kg/hr after holding for 1 hour 2) Recheck aptt at midnight  Thank you Anette Guarneri, PharmD [phone] 12/18/2016 8:56 PM

## 2016-12-19 ENCOUNTER — Inpatient Hospital Stay (HOSPITAL_COMMUNITY): Payer: Medicare Other

## 2016-12-19 DIAGNOSIS — Z95811 Presence of heart assist device: Secondary | ICD-10-CM

## 2016-12-19 LAB — CBC WITH DIFFERENTIAL/PLATELET
Basophils Absolute: 0.1 10*3/uL (ref 0.0–0.1)
Basophils Relative: 1 %
Eosinophils Absolute: 0.6 10*3/uL (ref 0.0–0.7)
Eosinophils Relative: 5 %
HCT: 26.6 % — ABNORMAL LOW (ref 39.0–52.0)
Hemoglobin: 8.9 g/dL — ABNORMAL LOW (ref 13.0–17.0)
Lymphocytes Relative: 5 %
Lymphs Abs: 0.7 10*3/uL (ref 0.7–4.0)
MCH: 30.4 pg (ref 26.0–34.0)
MCHC: 33.5 g/dL (ref 30.0–36.0)
MCV: 90.8 fL (ref 78.0–100.0)
Monocytes Absolute: 1.9 10*3/uL — ABNORMAL HIGH (ref 0.1–1.0)
Monocytes Relative: 14 %
Neutro Abs: 9.9 10*3/uL — ABNORMAL HIGH (ref 1.7–7.7)
Neutrophils Relative %: 75 %
Platelets: 124 10*3/uL — ABNORMAL LOW (ref 150–400)
RBC: 2.93 MIL/uL — ABNORMAL LOW (ref 4.22–5.81)
RDW: 17.3 % — ABNORMAL HIGH (ref 11.5–15.5)
WBC: 13.2 10*3/uL — ABNORMAL HIGH (ref 4.0–10.5)

## 2016-12-19 LAB — POCT I-STAT, CHEM 8
BUN: 62 mg/dL — AB (ref 6–20)
CREATININE: 2.3 mg/dL — AB (ref 0.61–1.24)
Calcium, Ion: 1.2 mmol/L (ref 1.15–1.40)
Chloride: 88 mmol/L — ABNORMAL LOW (ref 101–111)
GLUCOSE: 168 mg/dL — AB (ref 65–99)
HEMATOCRIT: 25 % — AB (ref 39.0–52.0)
HEMOGLOBIN: 8.5 g/dL — AB (ref 13.0–17.0)
POTASSIUM: 3.8 mmol/L (ref 3.5–5.1)
Sodium: 130 mmol/L — ABNORMAL LOW (ref 135–145)
TCO2: 29 mmol/L (ref 0–100)

## 2016-12-19 LAB — GLUCOSE, CAPILLARY
GLUCOSE-CAPILLARY: 103 mg/dL — AB (ref 65–99)
GLUCOSE-CAPILLARY: 170 mg/dL — AB (ref 65–99)
Glucose-Capillary: 151 mg/dL — ABNORMAL HIGH (ref 65–99)
Glucose-Capillary: 193 mg/dL — ABNORMAL HIGH (ref 65–99)
Glucose-Capillary: 129 mg/dL — ABNORMAL HIGH (ref 65–99)

## 2016-12-19 LAB — COMPREHENSIVE METABOLIC PANEL
ALT: 44 U/L (ref 17–63)
AST: 50 U/L — ABNORMAL HIGH (ref 15–41)
Albumin: 2.9 g/dL — ABNORMAL LOW (ref 3.5–5.0)
Alkaline Phosphatase: 102 U/L (ref 38–126)
Anion gap: 10 (ref 5–15)
BUN: 54 mg/dL — ABNORMAL HIGH (ref 6–20)
CO2: 29 mmol/L (ref 22–32)
Calcium: 8.5 mg/dL — ABNORMAL LOW (ref 8.9–10.3)
Chloride: 92 mmol/L — ABNORMAL LOW (ref 101–111)
Creatinine, Ser: 2.24 mg/dL — ABNORMAL HIGH (ref 0.61–1.24)
GFR calc Af Amer: 32 mL/min — ABNORMAL LOW (ref >60)
GFR calc non Af Amer: 28 mL/min — ABNORMAL LOW (ref >60)
Glucose, Bld: 148 mg/dL — ABNORMAL HIGH (ref 65–99)
Potassium: 3.7 mmol/L (ref 3.5–5.1)
Sodium: 131 mmol/L — ABNORMAL LOW (ref 135–145)
Total Bilirubin: 1.8 mg/dL — ABNORMAL HIGH (ref 0.3–1.2)
Total Protein: 5.7 g/dL — ABNORMAL LOW (ref 6.5–8.1)

## 2016-12-19 LAB — COOXEMETRY PANEL
Carboxyhemoglobin: 2.1 % — ABNORMAL HIGH (ref 0.5–1.5)
Methemoglobin: 1.1 % (ref 0.0–1.5)
O2 Saturation: 74 %
Total hemoglobin: 10.5 g/dL — ABNORMAL LOW (ref 12.0–16.0)

## 2016-12-19 LAB — PROTIME-INR
INR: 2.48
INR: 2.32
PROTHROMBIN TIME: 25.9 s — AB (ref 11.4–15.2)
Prothrombin Time: 27.3 seconds — ABNORMAL HIGH (ref 11.4–15.2)

## 2016-12-19 LAB — APTT
APTT: 56 s — AB (ref 24–36)
aPTT: 73 seconds — ABNORMAL HIGH (ref 24–36)

## 2016-12-19 LAB — LACTATE DEHYDROGENASE: LDH: 270 U/L — ABNORMAL HIGH (ref 98–192)

## 2016-12-19 LAB — MAGNESIUM: Magnesium: 2 mg/dL (ref 1.7–2.4)

## 2016-12-19 MED ORDER — WARFARIN SODIUM 2.5 MG PO TABS
2.5000 mg | ORAL_TABLET | Freq: Once | ORAL | Status: AC
  Administered 2016-12-19: 2.5 mg via ORAL
  Filled 2016-12-19: qty 1

## 2016-12-19 MED ORDER — SODIUM CHLORIDE 0.9 % IV SOLN: 1.5000 g | Freq: Three times a day (TID) | INTRAVENOUS | Status: DC

## 2016-12-19 MED ORDER — AMIODARONE HCL 200 MG PO TABS
200.0000 mg | ORAL_TABLET | Freq: Every day | ORAL | Status: DC
  Administered 2016-12-19 – 2016-12-21 (×3): 200 mg via ORAL
  Filled 2016-12-19 (×3): qty 1

## 2016-12-19 MED ORDER — LACTULOSE 10 GM/15ML PO SOLN
10.0000 g | Freq: Once | ORAL | Status: AC
  Administered 2016-12-19: 10 g via ORAL
  Filled 2016-12-19: qty 15

## 2016-12-19 MED ORDER — INSULIN ASPART 100 UNIT/ML ~~LOC~~ SOLN
0.0000 [IU] | Freq: Every day | SUBCUTANEOUS | Status: DC
  Administered 2016-12-22: 2 [IU] via SUBCUTANEOUS

## 2016-12-19 MED ORDER — ASPIRIN 81 MG PO CHEW
81.0000 mg | CHEWABLE_TABLET | Freq: Every day | ORAL | Status: DC
  Administered 2016-12-19 – 2017-01-01 (×14): 81 mg via ORAL
  Filled 2016-12-19 (×14): qty 1

## 2016-12-19 MED ORDER — NOREPINEPHRINE BITARTRATE 1 MG/ML IV SOLN
2.0000 ug/min | INTRAVENOUS | Status: DC
  Administered 2016-12-19: 1 ug/min via INTRAVENOUS
  Filled 2016-12-19: qty 16

## 2016-12-19 MED ORDER — WARFARIN - PHARMACIST DOSING INPATIENT
Freq: Every day | Status: DC
  Administered 2016-12-21 – 2016-12-29 (×3)

## 2016-12-19 MED ORDER — POTASSIUM CHLORIDE ER 10 MEQ PO TBCR
20.0000 meq | EXTENDED_RELEASE_TABLET | Freq: Every day | ORAL | Status: DC
  Filled 2016-12-19: qty 2

## 2016-12-19 MED ORDER — METOLAZONE 5 MG PO TABS
2.5000 mg | ORAL_TABLET | Freq: Once | ORAL | Status: AC
  Administered 2016-12-19: 2.5 mg via ORAL
  Filled 2016-12-19: qty 1

## 2016-12-19 MED ORDER — SORBITOL 70 % SOLN
45.0000 mL | Freq: Once | Status: AC
  Administered 2016-12-19: 45 mL via ORAL
  Filled 2016-12-19: qty 60

## 2016-12-19 MED ORDER — INSULIN ASPART 100 UNIT/ML ~~LOC~~ SOLN
0.0000 [IU] | Freq: Three times a day (TID) | SUBCUTANEOUS | Status: DC
  Administered 2016-12-19 – 2016-12-20 (×2): 2 [IU] via SUBCUTANEOUS
  Administered 2016-12-20: 3 [IU] via SUBCUTANEOUS
  Administered 2016-12-20 – 2016-12-21 (×2): 5 [IU] via SUBCUTANEOUS
  Administered 2016-12-21: 2 [IU] via SUBCUTANEOUS
  Administered 2016-12-21 – 2016-12-22 (×3): 3 [IU] via SUBCUTANEOUS
  Administered 2016-12-22: 5 [IU] via SUBCUTANEOUS
  Administered 2016-12-23: 3 [IU] via SUBCUTANEOUS
  Administered 2016-12-24: 5 [IU] via SUBCUTANEOUS
  Administered 2016-12-24: 3 [IU] via SUBCUTANEOUS
  Administered 2016-12-24: 2 [IU] via SUBCUTANEOUS
  Administered 2016-12-25: 5 [IU] via SUBCUTANEOUS
  Administered 2016-12-25 – 2016-12-26 (×4): 3 [IU] via SUBCUTANEOUS
  Administered 2016-12-27: 2 [IU] via SUBCUTANEOUS
  Administered 2016-12-27 (×2): 3 [IU] via SUBCUTANEOUS
  Administered 2016-12-28: 2 [IU] via SUBCUTANEOUS
  Administered 2016-12-28 (×2): 3 [IU] via SUBCUTANEOUS
  Administered 2016-12-29: 2 [IU] via SUBCUTANEOUS
  Administered 2016-12-29: 5 [IU] via SUBCUTANEOUS
  Administered 2016-12-29: 2 [IU] via SUBCUTANEOUS
  Administered 2016-12-30 (×2): 3 [IU] via SUBCUTANEOUS
  Administered 2016-12-30: 2 [IU] via SUBCUTANEOUS
  Administered 2016-12-31: 3 [IU] via SUBCUTANEOUS
  Administered 2016-12-31: 2 [IU] via SUBCUTANEOUS
  Administered 2016-12-31 – 2017-01-01 (×2): 3 [IU] via SUBCUTANEOUS
  Administered 2017-01-01: 2 [IU] via SUBCUTANEOUS

## 2016-12-19 MED ORDER — GLUCERNA SHAKE PO LIQD
237.0000 mL | Freq: Two times a day (BID) | ORAL | Status: DC
  Administered 2016-12-19 – 2017-01-01 (×20): 237 mL via ORAL

## 2016-12-19 MED ORDER — POTASSIUM CHLORIDE CRYS ER 10 MEQ PO TBCR
40.0000 meq | EXTENDED_RELEASE_TABLET | Freq: Every day | ORAL | Status: DC
  Administered 2016-12-19 – 2016-12-22 (×4): 40 meq via ORAL
  Filled 2016-12-19 (×5): qty 4

## 2016-12-19 MED ORDER — FENTANYL CITRATE (PF) 100 MCG/2ML IJ SOLN: 50.0000 ug | INTRAMUSCULAR | Status: DC | PRN

## 2016-12-19 NOTE — Care Management Note (Addendum)
Case Management Note  Patient Details  Name: Trevor [name] MRN: [id number] Date of Birth: 04-Jun-1946  Subjective/Objective:   S/p LVAD placement 2/9                 Action/Plan:  PTA independent from home with sister.  LVAD team following closely.  Pt has recommendations for HH - no orders as of yet.  CM will continue to follow for discharge needs   Expected Discharge Date:                  Expected Discharge Plan:  [place]  In-House Referral:     Discharge planning Services  CM Consult  Post Acute Care Choice:    Choice offered to:     DME Arranged:    DME Agency:     HH Arranged:    HH Agency:     Status of Service:  In process, will continue to follow  If discussed at Long Length of Stay Meetings, dates discussed:    Additional Comments: CM spoke in depth with pt - pt alert and oriented - remains on Milrinone and IV lasix.  Discussed in LOS 12/19/16 - pt remains appropriate for continued stay.  Pt confirmed he will discharge home with his sister - sister will provided around the clock supervision at discharge.   CM spoke with pt about [facility] recommended - pt had no preference with HH/DME agency- pt is in agreement with [facility] (LVAD agency of choice).  CM will arrange [facility] and DME when orders are written - agency is aware of tentative referral.   CM will continue to follow for discharge needs [name], RN 12/19/2016, 10:42 AM

## 2016-12-19 NOTE — Progress Notes (Signed)
ANTICOAGULATION CONSULT NOTE - Follow Up Consult  Pharmacy Consult for bivalirudin Indication: Afib/LVAD  Labs:  Recent Labs  12/17/16 0400  12/17/16 1604 12/18/16 0412  12/18/16 1339 12/18/16 1657 12/18/16 1715 12/18/16 2010 12/19/16 0000  HGB 8.8*  --  8.5* 8.6*  --   --  6.8* 8.7*  --   --   HCT 26.3*  --  25.0* 25.8*  --   --  20.0* 25.7*  --   --   PLT 77*  --   --  92*  --   --   --  105*  --   --   APTT  --   --   --   --   < > 46*  --  118* 113* 56*  LABPROT 17.6*  --   --  19.0*  --  22.9*  --   --   --   --   INR 1.43  --   --  1.58  --  1.99  --   --   --   --   CREATININE  --   < > 2.20* 2.14*  --   --  1.50*  --   --   --   < > = values in this interval not displayed.   Assessment/Plan:  71yo male therapeutic on bivalirudin after rate change. Will continue gtt at current rate and confirm stable with am labs.   Wynona Neat, PharmD, BCPS  12/19/2016,1:09 AM

## 2016-12-19 NOTE — Progress Notes (Signed)
Pharmacy Antibiotic Note  Trevor [name] is a 71 y.o. male admitted on 11/27/2016 with chest pain and leg swelling  Acute on chronic systolic CHF.  Now s/p LVAD placement and tricuspid valve repair.  S/P preand post op ABX complete.  Pt developed fever and started on meropenem renally adjusted 1gm q12.  Remains afebrile x48hr, WBC slightly elevated 13 post op.  Has been since been extubated and progressing well.    Trach aspirate Cx grew pseudomonas Pan sensitive. TA also grew acientobacter - S to only Unasyn. Spoke to  ID - as pt is clinically stable  - would not escalate therapy at this time      Plan: Continue meropenem 1gm IV q12 - reassess renal function daily and adjust dose if needed. Monitor clinical improvemnent adjust ABX as indicated  Height: 5\' 3"  (160 cm) Weight: 199 lb 4.7 oz (90.4 kg) IBW/kg (Calculated) : 56.9  Temp (24hrs), Avg:98.4 F (36.9 C), Min:97.8 F (36.6 C), Max:98.8 F (37.1 C)   Recent Labs Lab 12/16/16 1350  12/17/16 0400  12/17/16 1604 12/18/16 0412 12/18/16 1657 12/18/16 1715 12/19/16 0400 12/19/16 1458  WBC 17.3*  --  15.4*  --   --  11.6*  --  10.9* 13.2*  --   CREATININE  --   < >  --   < > 2.20* 2.14* 1.50*  --  2.24* 2.30*  < > = values in this interval not displayed.  Estimated Creatinine Clearance: 29.3 mL/min (by C-G formula based on SCr of 2.3 mg/dL (H)).    Allergies  Allergen Reactions  . No Known Allergies     2/11 TA psseudomonas pan S / acinetobacter S- unasyn only  Antimicrobials this admission: 2/9 Vancomycin >>  (2/11) for 48hr therapy 2/11 meropenem>   [name] Pharm.D. CPP, BCPS Clinical Pharmacist [phone] 12/19/2016 4:02 PM

## 2016-12-19 NOTE — Plan of Care (Signed)
Problem: Activity: Goal: Risk for activity intolerance will decrease Outcome: Progressing Pt walked 270ft w/ PT 2/14  Problem: Education: Goal: Knowledge of the prescribed therapeutic regimen will improve Outcome: Progressing Educating about medications as prescribed   Problem: Fluid Volume: Goal: Risk for excess fluid volume will decrease Outcome: Progressing BID lasix at this time

## 2016-12-19 NOTE — Progress Notes (Signed)
LVAD Coordinator Education Note:   Today I worked with Trevor [name]'s caregiver [name] (sister) regarding driveline exit site care, care of the driveline and lifestyle changes required with VAD. [name] assisted with the dressing change under my supervision today.   1. Discussed good hand-hygiene practice to prevent infection of the driveline site. Encouraged to maintain clean hands whenever they handle the dressing or driveline. Discussed signs and symptoms of infection and what to report to VAD Team.   2. The importance of lead immobilization has been stressed to patient and caregiver(s) using provided attachment device. Discussed appropriate placement to allow for smooth bends and to avoid tension.    3. The caregiver has demonstrated the following:   *Cleansing site with sterile technique   *Dressing care and maintenance   *Immobilizing driveline    Concerns with Technique or Ongoing Care: none  [name], RN VAD Coordinator   Office: [phone] 24/7 Emergency VAD Pager: [phone]

## 2016-12-19 NOTE — Progress Notes (Signed)
HeartMate 2 Rounding Note  Postop day 6 implantation HeartMate 2 VAD and tricuspid valve annuloplasty repair  Subjective:   Patient with ischemic cardiomyopathy, severe tricuspid regurgitation, ejection fraction 20% and chronic renal insufficiency. The patient was in cardiogenic shock with severe volume overload and deterioration in renal function on admission. He required inotropes and subsequently balloon pump in order to optimize his hemodynamic and renal status prior to VAD implantation on February 9.  [name] surgery was complicated by moderate RV dysfunction requiring inotropes and coagulopathy requiring platelets and component transfusion. His chest tube drainage Has recently tapered off in the last chest tube was removed today. Coumadin dosing per pharmacy, INR now 2.4. Short-term postop bivalirudin bridge has been stopped.  Nitric oxide weaned off, norepinephrine weaned off, epinephrine weaned off. Maintaining current milrinone at 0.25 LVAD parameters stable with few PI events  Remains in atrial fibrillation LVAD INTERROGATION:  HeartMate II LVAD:  Flow 5.2 liters/min, speed 9000 RPM, power 5.1, PI 5.8.  Controller intact  Patient continues to have a rattly cough-sputum cultures now positive for Pseudomonas and meropenem started yesterday Also colonization with Acinetobacter noted, ID recommends no formal exercise therapy since patient has been extubated  Patient was ambulated 300 feet today Echocardiogram performed yesterday shows adequate RV function, no pericardial effusion, no tricuspid regurgitation, inflow cannula in good position, septum fairly midline. Plan formal ramp echo later this week       Vital Signs:   Temp:  [97.8 F (36.6 C)-98.8 F (37.1 C)] 98.4 F (36.9 C) (02/15 1647) Pulse Rate:  [41-110] 96 (02/15 1630) Resp:  [9-26] 9 (02/15 1645) BP: (74-118)/(45-96) 74/55 (02/15 1645) SpO2:  [90 %-100 %] 92 % (02/15 1630) Weight:  [199 lb 4.7 oz (90.4 kg)]  199 lb 4.7 oz (90.4 kg) (02/15 0500) Last BM Date: 12/15/16 Mean arterial Pressure70-64mmHg  Intake/Output:   Intake/Output Summary (Last 24 hours) at 12/19/16 1737 Last data filed at 12/19/16 1700  Gross per 24 hour  Intake          1388.99 ml  Output             1588 ml  Net          -199.01 ml     Physical Exam: General:  Well appearing. No resp difficulty, Comfortable in chair HEENT: normal Neck: supple. JVP normal . Carotids trace pulsatility bilat; no bruits. No lymphadenopathy or thryomegaly appreciated. Cor: Mechanical heart sounds with LVAD hum present. Lungs: clear, cough congested Abdomen: soft, nontender, nondistended. No hepatosplenomegaly. No bruits or masses. Good bowel sounds. Extremities: no cyanosis, clubbing, rash, edema Neuro: alert & orientedx3, cranial nerves grossly intact. moves all 4 extremities w/o difficulty. Affect pleasant  Telemetry: Paced 75/m, atrial fibrillation  Labs: Basic Metabolic Panel:  Recent Labs Lab 12/14/16 0346  12/15/16 0230  12/16/16 0330  12/17/16 0400 12/17/16 0429 12/17/16 1604 12/18/16 0412 12/18/16 1657 12/19/16 0400 12/19/16 1458  NA 132*  < > 132*  < > 131*  < >  --  128* 128* 128* 128* 131* 130*  K 4.2  < > 4.4  < > 3.6  < >  --  4.0 4.5 4.0 3.8 3.7 3.8  CL 95*  < > 95*  < > 94*  < >  --  92* 89* 89* 93* 92* 88*  CO2 26  < > 26  --  26  --   --  26  --  27  --  29  --   GLUCOSE  121*  < > 172*  < > 85  < >  --  68 152* 122* 107* 148* 168*  BUN 32*  < > 32*  < > 38*  < >  --  45* 45* 47* 33* 54* 62*  CREATININE 1.57*  < > 1.76*  < > 1.85*  < >  --  1.98* 2.20* 2.14* 1.50* 2.24* 2.30*  CALCIUM 8.3*  < > 8.1*  --  8.2*  --   --  8.8*  --  8.6*  --  8.5*  --   MG 2.7*  < > 2.3  --  2.1  --  2.2  --   --  1.9  --  2.0  --   PHOS 3.5  --  4.2  --  4.3  --  4.4  --   --   --   --   --   --   < > = values in this interval not displayed.  Liver Function Tests:  Recent Labs Lab 12/15/16 0230 12/16/16 0330  12/17/16 0429 12/18/16 0412 12/19/16 0400  AST 76* 52* 43* 47* 50*  ALT 26 29 28  35 44  ALKPHOS 48 54 65 88 102  BILITOT 3.9* 2.7* 2.1* 1.8* 1.8*  PROT 5.2* 5.6* 6.0* 5.6* 5.7*  ALBUMIN 3.1* 3.1* 3.2* 2.9* 2.9*   No results for input(s): LIPASE, AMYLASE in the last 168 hours. No results for input(s): AMMONIA in the last 168 hours.  CBC:  Recent Labs Lab 12/15/16 0230  12/16/16 0330 12/16/16 1350  12/17/16 0400  12/18/16 0412 12/18/16 1657 12/18/16 1715 12/19/16 0400 12/19/16 1458  WBC 16.5*  < > 16.1* 17.3*  --  15.4*  --  11.6*  --  10.9* 13.2*  --   NEUTROABS 12.7*  --  12.9*  --   --  11.7*  --  8.4*  --   --  9.9*  --   HGB 8.6*  < > 8.1* 7.8*  < > 8.8*  < > 8.6* 6.8* 8.7* 8.9* 8.5*  HCT 25.4*  < > 23.5* 23.2*  < > 26.3*  < > 25.8* 20.0* 25.7* 26.6* 25.0*  MCV 87.3  < > 88.3 88.9  --  88.9  --  89.9  --  90.2 90.8  --   PLT 81*  < > 79* 74*  --  77*  --  92*  --  105* 124*  --   < > = values in this interval not displayed.  INR:  Recent Labs Lab 12/17/16 0400 12/18/16 0412 12/18/16 1339 12/19/16 0400 12/19/16 1400  INR 1.43 1.58 1.99 2.48 2.32    Other results:  EKG:   Imaging: Dg Chest Port 1 View  Result Date: 12/19/2016 CLINICAL DATA:  Status post tricuspid valve replacement and left ventricular assist device placement on December 13, 2016 EXAM: PORTABLE CHEST 1 VIEW COMPARISON:  Portable chest x-ray of December 18, 2016 FINDINGS: The right apical pneumothorax appears stable. The pulmonary interstitial markings and pulmonary vascularity remain prominent. Confluent alveolar opacity in the left perihilar region is more conspicuous today. The cardiac silhouette remains enlarged. The ICD is in stable position. The left ventricular assist device also appears to be in stable position. The prosthetic cardiac valve cage is in stable position. The left chest tube has been removed. The left-sided PICC line tip projects over the midportion of the SVC. IMPRESSION:  Stable 5% right apical pneumothorax. Stable appearance of the ICD and left ventricular assist device. The  left-sided chest tube has been removed. Slight interval increase in pulmonary interstitial edema and pulmonary vascular congestion. Electronically Signed   By: David  Martinique M.D.   On: 12/19/2016 07:31   Dg Chest Port 1 View  Result Date: 12/18/2016 CLINICAL DATA:  Postop left ventricular assist device EXAM: PORTABLE CHEST 1 VIEW COMPARISON:  12/17/2016 FINDINGS: Cardiomediastinal silhouette is stable. Single lead cardiac pacemaker is unchanged in position. Status post median sternotomy. Cardiac valve prosthesis again noted. Left ventricular assist device is unchanged in position. Stable left chest tube position. The right chest tube has been removed. There is small right apical pneumothorax about 5%. No infiltrate or pulmonary edema. Bilateral basilar atelectasis again noted. IMPRESSION: Status post median sternotomy. Cardiac valve prosthesis again noted. Left ventricular assist device is unchanged in position. Stable left chest tube position. The right chest tube has been removed. There is small right apical pneumothorax about 5%. No infiltrate or pulmonary edema. Bilateral basilar atelectasis again noted. Electronically Signed   By: Lahoma Crocker M.D.   On: 12/18/2016 08:51     Medications:     Scheduled Medications: . sodium chloride   Intravenous Once  . sodium chloride   Intravenous Once  . amiodarone  200 mg Oral Daily  . arformoterol  15 mcg Nebulization BID  . aspirin  81 mg Oral Daily  . bisacodyl  10 mg Oral Daily   Or  . bisacodyl  10 mg Rectal Daily  . budesonide (PULMICORT) nebulizer solution  0.5 mg Nebulization BID  . busPIRone  5 mg Oral BID  . Chlorhexidine Gluconate Cloth  6 each Topical Daily  . chlorpheniramine-HYDROcodone  5 mL Oral Q12H  . docusate sodium  200 mg Oral Daily  . feeding supplement (GLUCERNA SHAKE)  237 mL Oral BID BM  . fluticasone  1 spray Each Nare  Daily  . furosemide  80 mg Intravenous BID  . gabapentin  300 mg Oral Daily  . guaiFENesin  600 mg Oral BID  . insulin aspart  0-15 Units Subcutaneous TID WC  . insulin aspart  0-5 Units Subcutaneous QHS  . lactulose  10 g Oral Once  . levothyroxine  25 mcg Oral QAC breakfast  . mouth rinse  15 mL Mouth Rinse BID  . meropenem (MERREM) IV  1 g Intravenous Q12H  . metoCLOPramide (REGLAN) injection  10 mg Intravenous Q6H  . pantoprazole  40 mg Oral Q1200  . potassium chloride  40 mEq Oral Daily  . sildenafil  20 mg Oral BID  . sodium chloride flush  10-40 mL Intracatheter Q12H  . sodium chloride flush  10-40 mL Intracatheter Q12H  . sodium chloride flush  3 mL Intravenous Q12H  . umeclidinium bromide  1 puff Inhalation Daily  . Warfarin - Pharmacist Dosing Inpatient   Does not apply q1800    Infusions: . sodium chloride    . sodium chloride 20 mL/hr (12/16/16 1607)  . lactated ringers 20 mL/hr at 12/13/16 1900  . lactated ringers 20 mL/hr at 12/18/16 2200  . milrinone 0.25 mcg/kg/min (12/19/16 1715)  . norepinephrine (LEVOPHED) Adult infusion 2 mcg/min (12/19/16 1630)    PRN Medications: acetaminophen, fentaNYL (SUBLIMAZE) injection, levalbuterol, ondansetron (ZOFRAN) IV, oxyCODONE, sodium chloride flush, sodium chloride flush, sodium chloride flush, traMADol, traZODone   Assessment:  Acute on chronic systolic heart failure from ischemic cardiomyopathy treated with HeartMate 2 implantation for destination therapy indication Moderate RV failure requiring inotropes and inhaled nitric oxide Preoperative acute on chronic renal failure Severe tricuspid  regurgitation corrected with 28 mm ring annuloplasty 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 White count improving  Plan/Discussion:   Mobilize with PT Creatinine increased to 2.2, continue diuresis Continue twice a day IV Lasix Patient and family education with exit site dressings in progress Continue ambulation One week of IV antibiotics for gram-negative tracheal- bronchitis with heavy airway secretions    I reviewed the LVAD parameters from today, and compared the results to the patient's prior recorded data.  No programming changes were made.  The LVAD is functioning within specified parameters.  The patient performs LVAD self-test daily.  LVAD interrogation was negative for any significant power changes, alarms or PI events/speed drops.  LVAD equipment check completed and is in good working order.  Back-up equipment present.   LVAD education done on emergency procedures and precautions and reviewed exit site care.  Length of Stay: [place] [facility] 12/19/2016, 5:37 PM

## 2016-12-19 NOTE — Progress Notes (Signed)
ANTICOAGULATION CONSULT NOTE - Follow Up Consult  Pharmacy Consult for Bivalirudin > warfarin Indication: atrial fibrillation/LVAD  Allergies  Allergen Reactions  . No Known Allergies     Patient Measurements: Height: 5\' 3"  (160 cm) Weight: 199 lb 4.7 oz (90.4 kg) IBW/kg (Calculated) : 56.9  Vital Signs: Temp: 97.8 F (36.6 C) (02/15 1205) Temp Source: Oral (02/15 1205) BP: 99/75 (02/15 1515) Pulse Rate: 98 (02/15 1500)  Labs:  Recent Labs  12/18/16 0412  12/18/16 1339 12/18/16 1657 12/18/16 1715 12/18/16 2010 12/19/16 0000 12/19/16 0400 12/19/16 1400 12/19/16 1458  HGB 8.6*  --   --  6.8* 8.7*  --   --  8.9*  --  8.5*  HCT 25.8*  --   --  20.0* 25.7*  --   --  26.6*  --  25.0*  PLT 92*  --   --   --  105*  --   --  124*  --   --   APTT  --   < > 46*  --  118* 113* 56* 73*  --   --   LABPROT 19.0*  --  22.9*  --   --   --   --  27.3* 25.9*  --   INR 1.58  --  1.99  --   --   --   --  2.48 2.32  --   CREATININE 2.14*  --   --  1.50*  --   --   --  2.24*  --  2.30*  < > = values in this interval not displayed.  Estimated Creatinine Clearance: 29.3 mL/min (by C-G formula based on SCr of 2.3 mg/dL (H)).    Assessment: 70yom on apixaban pta for afib (last dose 1/31 @ 2110), transitioned to IV heparin for LVAD placement (2/9).   Patient now POD #6 on warfarin.  Bivalirudan initiated 2/14 until INR > 2.  INR 2.48 this am and bivalirudan stopped, bivalirudan can elevate INR so recheck done this afternoon INR 2.3> discussed with MD > will decrease warfarin dose this evening.  (INR yesterday 1.58 so decent jump after warfarin 5mg  last pm)  PLTC improving post op 70s>124.  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  Low 4ts and low antibodies so not continuing IV anticoagulation.  Goal of Therapy:  INR 2-2.5 aPTT 50-75 seconds per PVT Monitor platelets by anticoagulation protocol: Yes   Plan:   Warfarin 2.5mg  tonight Daily protime, CBC  [name] Pharm.D. CPP,  BCPS Clinical Pharmacist [phone] 12/19/2016 4:22 PM

## 2016-12-19 NOTE — Progress Notes (Signed)
Nutrition Follow Up  DOCUMENTATION CODES:   Obesity unspecified  INTERVENTION:    Glucerna Shake po BID, each supplement provides 220 kcal and 10 grams of protein  NEW NUTRITION DIAGNOSIS:   Increased nutrient needs related to  (post-op healing) as evidenced by estimated needs, ongoing   GOAL:   Patient will meet greater than or equal to 90% of their needs, progressing  MONITOR:   PO intake, Supplement acceptance, Labs, Weight trends, I & O's  ASSESSMENT:   71 y.o. male with medical history significant of CHF, CKD, DM2, asthma. Patient presents to the ED with c/o worsening leg edema and SOB. Admitted with marked volume overload and low output HF. LVAD workup ongoing.  Pt s/p procedures 2/9: INSERTION OF IMPLANTABLE LEFT VENTRICULAR ASSIST DEVICE (N/A) - HEARTMATE II  Pt extubated 2/11. PICC line placed 2/12. Heart Failure Rounding Note reviewed. PO intake good at 75% per flowsheet records. CBG's [id number].  Diet Order:  Diet heart healthy/carb modified Room service appropriate? Yes; Fluid consistency: Thin; Fluid restriction: 1500 mL Fluid  Skin:  Reviewed, no issues  Last BM:  2/11  Height:   Ht Readings from Last 1 Encounters:  12/13/16 5\' 3"  (1.6 m)    Weight:   Wt Readings from Last 1 Encounters:  12/19/16 199 lb 4.7 oz (90.4 kg)   Ideal Body Weight:  61.8 kg  BMI:  Body mass index is 35.3 kg/m.  Estimated Nutritional Needs:   Kcal:  1900-2100  Protein:  105-115 gm  Fluid:  per MD  EDUCATION NEEDS:   No education needs identified at this time  Arthur Holms, RD, LDN Pager #: [phone] After-Hours Pager #: [phone]

## 2016-12-19 NOTE — Plan of Care (Signed)
Problem: Education: Goal: Knowledge of the prescribed therapeutic regimen will improve Outcome: Progressing Discussed driveline care and dressing procedure with [name]. She assisted and observed during session. She will need to perform driveline dressing changes with assistance of RN staff throughout the remainder of hospitalization. Explained system controller. See VAD Coordinator note.

## 2016-12-19 NOTE — Progress Notes (Addendum)
Patient ID: Trevor [name], male   DOB: 1946/01/04, 71 y.o.   MRN: [id number]    Advanced Heart Failure Rounding Note   Subjective:    Admitted with marked volume overload and low output HF.  Echo EF 20-25. Failed dual inotropes. IABP placed 2/7.   Underwent HM-II placement and TV repair on 2/9.   POD #5 today  Off NO. Off norepinephrine and epinephrine, remains on milrinone 0.375.  Speed increased to 9000 rpm 2/12 to facilitate weaning pressors. Co-ox 74%.  Chest tubes out.   MAP 70s-80s.  I/Os mildly negative but weight up.  CVP remains elevated ranging 15-20.  CXR looks more wet.    Slept better.    1 unit PRBCs 2/12.   Respiratory cultures with Pseudomonas, meropenem started.  Afebrile.   LVAD parameters:   Speed 9000  Flow 5.7 PI 3.8 Power 5.6.  7 PI events/24 hrs.   Objective:   Weight Range:  Vital Signs:   Temp:  [98.3 F (36.8 C)-98.8 F (37.1 C)] 98.6 F (37 C) (02/15 0732) Pulse Rate:  [31-98] 98 (02/15 0700) Resp:  [9-18] 12 (02/15 0700) BP: (72-118)/(45-82) 93/58 (02/15 0700) SpO2:  [90 %-100 %] 96 % (02/15 0700) Weight:  [199 lb 4.7 oz (90.4 kg)] 199 lb 4.7 oz (90.4 kg) (02/15 0500) Last BM Date: 12/15/16  Weight change: Filed Weights   12/17/16 0600 12/18/16 0600 12/19/16 0500  Weight: 202 lb 9.6 oz (91.9 kg) 195 lb 5.2 oz (88.6 kg) 199 lb 4.7 oz (90.4 kg)    Intake/Output:   Intake/Output Summary (Last 24 hours) at 12/19/16 0748 Last data filed at 12/19/16 0700  Gross per 24 hour  Intake           889.16 ml  Output             1998 ml  Net         -1108.84 ml     Physical Exam: General:  NAD, in chair.  HEENT: normal   Neck: supple, JVP 12-14 cm.  Cor: Sternal dressing in place. +CTs. LVAD hum  Lungs: CTAB  Abdomen: obese soft, NT. Hypoactive BS. Driveline site ok Extremities: no cyanosis, clubbing, rash.  Trace ankle edema.  Neuro: Alert/oriented.  GU: Foley   Telemetry: Reviewed,  Atrial fibrillation, rate 80s   Labs: Basic  Metabolic Panel:  Recent Labs Lab 12/14/16 0346  12/15/16 0230  12/16/16 0330  12/17/16 0400 12/17/16 0429 12/17/16 1604 12/18/16 0412 12/18/16 1657 12/19/16 0400  NA 132*  < > 132*  < > 131*  < >  --  128* 128* 128* 128* 131*  K 4.2  < > 4.4  < > 3.6  < >  --  4.0 4.5 4.0 3.8 3.7  CL 95*  < > 95*  < > 94*  < >  --  92* 89* 89* 93* 92*  CO2 26  < > 26  --  26  --   --  26  --  27  --  29  GLUCOSE 121*  < > 172*  < > 85  < >  --  68 152* 122* 107* 148*  BUN 32*  < > 32*  < > 38*  < >  --  45* 45* 47* 33* 54*  CREATININE 1.57*  < > 1.76*  < > 1.85*  < >  --  1.98* 2.20* 2.14* 1.50* 2.24*  CALCIUM 8.3*  < > 8.1*  --  8.2*  --   --  8.8*  --  8.6*  --  8.5*  MG 2.7*  < > 2.3  --  2.1  --  2.2  --   --  1.9  --  2.0  PHOS 3.5  --  4.2  --  4.3  --  4.4  --   --   --   --   --   < > = values in this interval not displayed.  Liver Function Tests:  Recent Labs Lab 12/15/16 0230 12/16/16 0330 12/17/16 0429 12/18/16 0412 12/19/16 0400  AST 76* 52* 43* 47* 50*  ALT 26 29 28  35 44  ALKPHOS 48 54 65 88 102  BILITOT 3.9* 2.7* 2.1* 1.8* 1.8*  PROT 5.2* 5.6* 6.0* 5.6* 5.7*  ALBUMIN 3.1* 3.1* 3.2* 2.9* 2.9*   No results for input(s): LIPASE, AMYLASE in the last 168 hours. No results for input(s): AMMONIA in the last 168 hours.  CBC:  Recent Labs Lab 12/15/16 0230  12/16/16 0330 12/16/16 1350  12/17/16 0400 12/17/16 1604 12/18/16 0412 12/18/16 1657 12/18/16 1715 12/19/16 0400  WBC 16.5*  < > 16.1* 17.3*  --  15.4*  --  11.6*  --  10.9* 13.2*  NEUTROABS 12.7*  --  12.9*  --   --  11.7*  --  8.4*  --   --  9.9*  HGB 8.6*  < > 8.1* 7.8*  < > 8.8* 8.5* 8.6* 6.8* 8.7* 8.9*  HCT 25.4*  < > 23.5* 23.2*  < > 26.3* 25.0* 25.8* 20.0* 25.7* 26.6*  MCV 87.3  < > 88.3 88.9  --  88.9  --  89.9  --  90.2 90.8  PLT 81*  < > 79* 74*  --  77*  --  92*  --  105* 124*  < > = values in this interval not displayed.  Cardiac Enzymes:  Recent Labs Lab 12/12/16 0918  TROPONINI 0.06*     BNP: BNP (last 3 results)  Recent Labs  11/28/16 1104 12/14/16 0348  BNP 1,825.3* 427.7*    ProBNP (last 3 results)  Recent Labs  11/27/16 1207  PROBNP 1,746.0*      Other results:  Imaging: Dg Chest Port 1 View  Result Date: 12/19/2016 CLINICAL DATA:  Status post tricuspid valve replacement and left ventricular assist device placement on December 13, 2016 EXAM: PORTABLE CHEST 1 VIEW COMPARISON:  Portable chest x-ray of December 18, 2016 FINDINGS: The right apical pneumothorax appears stable. The pulmonary interstitial markings and pulmonary vascularity remain prominent. Confluent alveolar opacity in the left perihilar region is more conspicuous today. The cardiac silhouette remains enlarged. The ICD is in stable position. The left ventricular assist device also appears to be in stable position. The prosthetic cardiac valve cage is in stable position. The left chest tube has been removed. The left-sided PICC line tip projects over the midportion of the SVC. IMPRESSION: Stable 5% right apical pneumothorax. Stable appearance of the ICD and left ventricular assist device. The left-sided chest tube has been removed. Slight interval increase in pulmonary interstitial edema and pulmonary vascular congestion. Electronically Signed   By: David  Martinique M.D.   On: 12/19/2016 07:31   Dg Chest Port 1 View  Result Date: 12/18/2016 CLINICAL DATA:  Postop left ventricular assist device EXAM: PORTABLE CHEST 1 VIEW COMPARISON:  12/17/2016 FINDINGS: Cardiomediastinal silhouette is stable. Single lead cardiac pacemaker is unchanged in position. Status post median sternotomy. Cardiac valve prosthesis again noted. Left ventricular assist device is unchanged in position. Stable  left chest tube position. The right chest tube has been removed. There is small right apical pneumothorax about 5%. No infiltrate or pulmonary edema. Bilateral basilar atelectasis again noted. IMPRESSION: Status post median  sternotomy. Cardiac valve prosthesis again noted. Left ventricular assist device is unchanged in position. Stable left chest tube position. The right chest tube has been removed. There is small right apical pneumothorax about 5%. No infiltrate or pulmonary edema. Bilateral basilar atelectasis again noted. Electronically Signed   By: Lahoma Crocker M.D.   On: 12/18/2016 08:51     Medications:     Scheduled Medications: . sodium chloride   Intravenous Once  . sodium chloride   Intravenous Once  . amiodarone  200 mg Oral Daily  . arformoterol  15 mcg Nebulization BID  . aspirin  81 mg Oral Daily  . bisacodyl  10 mg Oral Daily   Or  . bisacodyl  10 mg Rectal Daily  . budesonide (PULMICORT) nebulizer solution  0.5 mg Nebulization BID  . busPIRone  5 mg Oral BID  . Chlorhexidine Gluconate Cloth  6 each Topical Daily  . chlorpheniramine-HYDROcodone  5 mL Oral Q12H  . docusate sodium  200 mg Oral Daily  . fluticasone  1 spray Each Nare Daily  . furosemide  80 mg Intravenous BID  . gabapentin  300 mg Oral Daily  . guaiFENesin  600 mg Oral BID  . insulin aspart  0-24 Units Subcutaneous Q4H  . levothyroxine  25 mcg Oral QAC breakfast  . mouth rinse  15 mL Mouth Rinse BID  . meropenem (MERREM) IV  1 g Intravenous Q12H  . metoCLOPramide (REGLAN) injection  10 mg Intravenous Q6H  . metolazone  2.5 mg Oral Once  . pantoprazole  40 mg Oral Q1200  . potassium chloride  40 mEq Oral Daily  . sildenafil  20 mg Oral BID  . sodium chloride flush  10-40 mL Intracatheter Q12H  . sodium chloride flush  10-40 mL Intracatheter Q12H  . sodium chloride flush  3 mL Intravenous Q12H  . umeclidinium bromide  1 puff Inhalation Daily  . warfarin  5 mg Oral q1800  . Warfarin - Physician Dosing Inpatient   Does not apply q1800    Infusions: . sodium chloride    . sodium chloride 20 mL/hr (12/16/16 1607)  . lactated ringers 20 mL/hr at 12/13/16 1900  . lactated ringers 20 mL/hr at 12/18/16 2200  . milrinone  0.25 mcg/kg/min (12/19/16 0403)    PRN Medications: acetaminophen, fentaNYL (SUBLIMAZE) injection, levalbuterol, ondansetron (ZOFRAN) IV, oxyCODONE, sodium chloride flush, sodium chloride flush, sodium chloride flush, traMADol, traZODone   Assessment/Plan/Discussion    1. Acute on chronic systolic CHF: Ischemic cardiomyopathy but with prominent RV dysfunction on echo and exam.  [employer] ICD.  Echo this admission showed EF 20-25% with moderately dilated LV and moderately dilated/moderately dysfunctional RV with severe TR.  He was admitted with low output biventricular failure, cardiorenal syndrome, and marked volume overload. Failed dual inotrope support. IABP placed 2/7. HM-II and TV repair performed 2/9 after discussion with [facility] transplant team. POD #3 HM-II and TV repair.  Extubated 2/11, off NO, norepinephrine, vasopressin, and epinephrine. Speed increased to 9000 rpm to facilitate weaning pressors. Remains on milrinone 0.375.  Still volume overloaded by exam and CVP (varies 15-20 with respiration), co-ox 74%, MAP 70s-80s.  CXR looks wet.  BUN/creatinine up a bit.  - Continue milrinone 0.375 for RV support, would not wean today with need for diuresis and rise in  creatinine.  - Lasix 80 mg IV bid + metolazone 2.5 x 1.  Reduction of renal venous pressure by diuresis may help renal function.   - Sildenafil begun at 20 mg tid for RV failure.  Now off NO.  - INR therapeutic.  Continue warfarin, stop bivalirudin and decreased ASA to 81 daily.   - Will do formal ramp echo in am.  2. CAD: s/p CABG.  No signs/sx of ischemia.  3. AKI on CKD stage III: Suspect cardiorenal syndrome in setting of low output failure/RV failure.  BUN/creatinine up a bit, looks more wet on exam.  Hopefully augmented diuresis will lower renal venous pressure/help renal function.  4. Atrial fibrillation: Chronic.  Rate is not elevated.  - Anticoagulation per surgical team. Therapeutic on warfarin.   5. Post-op anemia: Also  component of anemia of renal disease/chronic disease.  He got 1 unit PRBCs 2/12, appropriate increase in hgb, hgb stable.  6. Acute liver failure, post-op: Bili improving 5.0 -> 3.9 -> 2.7 -> 2.1 -> 1.8. Likely hypoperfusion. Holding hepato-toxic agents.  7. Aortic stenosis s/p TAVR: Stable on echo.  8. ID: Afebrile.  WBCs elevated but lower again today.  Pseudomonas in respiratory culture, suspect tracheobronchitis.  Covering with meropenem.  9. Thrombocytopenia: Platelets rising. 10. Hyponatremia: Hypervolemic hyponatremia.  Fluid restrict.  11. Pocket site pain: Better with gabapentin.   35 minutes critical care time.   Loralie Champagne, MD  12/19/2016, 7:48 AM  Advanced Heart Failure Team Pager [phone] (M-F; 7a - 4p)  Please contact [facility] for night-coverage after hours (4p -7a ) and weekends on [email or web address]

## 2016-12-19 NOTE — Progress Notes (Signed)
CSW met at bedside with patient and VAD Coordinator [name]. Patient was getting ready for dressing change and stated he was feeling well. Patient reported that he ambulated a short distance today and is pleased with his progress. Patient's sister is at bedside and motivated to learn dressing change and care for patient. CSW provided supportive intervention and will continue to follow for support throughout implant hospitalization. Raquel Sarna, [place], [place]

## 2016-12-19 NOTE — Progress Notes (Signed)
Physical Therapy Treatment Patient Details Name: Trevor [name] MRN: [id number] DOB: 14-Jun-1946 Today's Date: 12/19/2016    History of Present Illness  71 y.o.male with ischemic cardiomyopathy, severe tricuspid regurgitation, ejection fraction 20% and chronic renal insufficiency, CHF, CKD, DM2, asthma. The patient was in cardiogenic shock with severe volume overload and deterioration in renal function on admission s/p LVAD Heartmate II implant 2/9     PT Comments    Pt very pleasant, in chair on arrival and willing to ambulate. Pt able to complete the entire unit today with standing rests throughout. Pt reported no SOB or dizziness.   Pt educated for power transfers today and equipment positioning. Pt able to complete transfer from main power to batteries with min assist for cues and sequence. On return from batteries to main power pt unhooking battery before power line in hand and required increased cues and direction for safety and sequence with transition off of batteries. [name] does not appear to be impacted by minimal edema of his hands. Max assist to don vest  Hr 77-102 sats 96% on 4L [name]  Flow 5.7  liters/min, speed 9000 RPM, power 6.0, PI 4.8  Follow Up Recommendations  Home health PT;Supervision for mobility/OOB     Equipment Recommendations  Rolling walker with 5" wheels (rollator)    Recommendations for Other Services       Precautions / Restrictions Precautions Precautions: Sternal;Fall;Other (comment) Precaution Comments: reviewed sternal precautions, LVAD Restrictions Weight Bearing Restrictions: Yes (sternal precautions)    Mobility  Bed Mobility Overal bed mobility: Needs Assistance Bed Mobility: Sit to Supine       Sit to supine: Mod assist   General bed mobility comments: cues for sequence and assist to elevate legs onto surface as well as control trunk  Transfers Overall transfer level: Needs assistance   Transfers: Sit to/from  Stand Sit to Stand: Min guard         General transfer comment: cues for hand placement and safety with no physical assist needed  Ambulation/Gait Ambulation/Gait assistance: Min assist;+2 safety/equipment Ambulation Distance (Feet): 300 Feet Assistive device: Rolling walker (2 wheeled) Gait Pattern/deviations: Step-through pattern;Decreased stride length;Trunk flexed   Gait velocity interpretation: Below normal speed for age/gender General Gait Details: slow speed with cues for position in RW. grossly 6 standing rests of approximately 10 sec each   Stairs            Wheelchair Mobility    Modified Rankin (Stroke Patients Only)       Balance     Sitting balance-[name]: Good       Standing balance-[name]: Good                      Cognition Arousal/Alertness: Awake/alert Behavior During Therapy: WFL for tasks assessed/performed Overall Cognitive Status: Within Functional Limits for tasks assessed                 General Comments: A&Ox4, able to recall all sternal precautions    Exercises      General Comments        Pertinent Vitals/Pain Pain Score: 7  Pain Location: left chest and drive line insertion Pain Descriptors / Indicators: Sore Pain Intervention(s): Limited activity within patient's tolerance;Repositioned;Monitored during session    Home Living                      Prior Function  PT Goals (current goals can now be found in the care plan section) Progress towards PT goals: Progressing toward goals    Frequency           PT Plan Current plan remains appropriate    Co-evaluation             End of Session Equipment Utilized During Treatment: Oxygen Activity Tolerance: Patient tolerated treatment well Patient left: in bed;with call bell/phone within reach;with nursing/sitter in room     Time: 0837-0921 PT Time Calculation (min) (ACUTE ONLY): 44 min  Charges:  $Gait  Training: 23-37 mins $Therapeutic Activity: 8-22 mins                    G Codes:      Jahfari Ambers B Arwa Yero 01/03/2017, 10:56 AM [name], [place]

## 2016-12-20 ENCOUNTER — Inpatient Hospital Stay (HOSPITAL_COMMUNITY): Payer: Medicare Other

## 2016-12-20 DIAGNOSIS — I501 Left ventricular failure: Secondary | ICD-10-CM

## 2016-12-20 LAB — CBC WITH DIFFERENTIAL/PLATELET
Basophils Absolute: 0.2 10*3/uL — ABNORMAL HIGH (ref 0.0–0.1)
Basophils Relative: 1 %
Eosinophils Absolute: 0.7 10*3/uL (ref 0.0–0.7)
Eosinophils Relative: 4 %
HCT: 25.9 % — ABNORMAL LOW (ref 39.0–52.0)
Hemoglobin: 8.6 g/dL — ABNORMAL LOW (ref 13.0–17.0)
Lymphocytes Relative: 6 %
Lymphs Abs: 1 10*3/uL (ref 0.7–4.0)
MCH: 30.2 pg (ref 26.0–34.0)
MCHC: 33.2 g/dL (ref 30.0–36.0)
MCV: 90.9 fL (ref 78.0–100.0)
Monocytes Absolute: 1.8 10*3/uL — ABNORMAL HIGH (ref 0.1–1.0)
Monocytes Relative: 11 %
Neutro Abs: 12.9 10*3/uL — ABNORMAL HIGH (ref 1.7–7.7)
Neutrophils Relative %: 78 %
Platelets: 149 10*3/uL — ABNORMAL LOW (ref 150–400)
RBC: 2.85 MIL/uL — ABNORMAL LOW (ref 4.22–5.81)
RDW: 17.1 % — ABNORMAL HIGH (ref 11.5–15.5)
WBC: 16.6 10*3/uL — ABNORMAL HIGH (ref 4.0–10.5)

## 2016-12-20 LAB — GLUCOSE, CAPILLARY
GLUCOSE-CAPILLARY: 218 mg/dL — AB (ref 65–99)
GLUCOSE-CAPILLARY: 135 mg/dL — AB (ref 65–99)
Glucose-Capillary: 160 mg/dL — ABNORMAL HIGH (ref 65–99)
Glucose-Capillary: 190 mg/dL — ABNORMAL HIGH (ref 65–99)

## 2016-12-20 LAB — COMPREHENSIVE METABOLIC PANEL
ALT: 46 U/L (ref 17–63)
AST: 49 U/L — ABNORMAL HIGH (ref 15–41)
Albumin: 2.8 g/dL — ABNORMAL LOW (ref 3.5–5.0)
Alkaline Phosphatase: 129 U/L — ABNORMAL HIGH (ref 38–126)
Anion gap: 11 (ref 5–15)
BUN: 59 mg/dL — ABNORMAL HIGH (ref 6–20)
CO2: 28 mmol/L (ref 22–32)
Calcium: 8.8 mg/dL — ABNORMAL LOW (ref 8.9–10.3)
Chloride: 91 mmol/L — ABNORMAL LOW (ref 101–111)
Creatinine, Ser: 2.32 mg/dL — ABNORMAL HIGH (ref 0.61–1.24)
GFR calc Af Amer: 31 mL/min — ABNORMAL LOW (ref >60)
GFR calc non Af Amer: 27 mL/min — ABNORMAL LOW (ref >60)
Glucose, Bld: 174 mg/dL — ABNORMAL HIGH (ref 65–99)
Potassium: 3.9 mmol/L (ref 3.5–5.1)
Sodium: 130 mmol/L — ABNORMAL LOW (ref 135–145)
Total Bilirubin: 1.8 mg/dL — ABNORMAL HIGH (ref 0.3–1.2)
Total Protein: 6 g/dL — ABNORMAL LOW (ref 6.5–8.1)

## 2016-12-20 LAB — PROTIME-INR
INR: 2.65
Prothrombin Time: 28.8 seconds — ABNORMAL HIGH (ref 11.4–15.2)

## 2016-12-20 LAB — COOXEMETRY PANEL
Carboxyhemoglobin: 2.3 % — ABNORMAL HIGH (ref 0.5–1.5)
Methemoglobin: 1.1 % (ref 0.0–1.5)
O2 Saturation: 64.9 %
Total hemoglobin: 9.1 g/dL — ABNORMAL LOW (ref 12.0–16.0)

## 2016-12-20 LAB — MAGNESIUM: Magnesium: 2 mg/dL (ref 1.7–2.4)

## 2016-12-20 LAB — LACTATE DEHYDROGENASE: LDH: 293 U/L — ABNORMAL HIGH (ref 98–192)

## 2016-12-20 LAB — ECHOCARDIOGRAM LIMITED
Height: 63 in
Weight: 3206.37 oz

## 2016-12-20 MED ORDER — METOLAZONE 5 MG PO TABS
5.0000 mg | ORAL_TABLET | Freq: Once | ORAL | Status: AC
  Administered 2016-12-20: 5 mg via ORAL
  Filled 2016-12-20: qty 1

## 2016-12-20 MED ORDER — SODIUM CHLORIDE 0.9 % IV SOLN
INTRAVENOUS | Status: DC | PRN
  Administered 2016-12-27 – 2016-12-29 (×3): via INTRA_ARTERIAL

## 2016-12-20 MED ORDER — FUROSEMIDE 10 MG/ML IJ SOLN
10.0000 mg/h | INTRAVENOUS | Status: DC
  Administered 2016-12-20 – 2016-12-23 (×4): 10 mg/h via INTRAVENOUS
  Filled 2016-12-20 (×7): qty 25

## 2016-12-20 MED ORDER — LACTULOSE 10 GM/15ML PO SOLN
30.0000 g | Freq: Once | ORAL | Status: AC
  Administered 2016-12-20: 30 g via ORAL
  Filled 2016-12-20: qty 45

## 2016-12-20 MED ORDER — METOLAZONE 5 MG PO TABS: 2.5000 mg | ORAL_TABLET | Freq: Once | ORAL | Status: AC

## 2016-12-20 MED ORDER — WARFARIN SODIUM 1 MG PO TABS
1.0000 mg | ORAL_TABLET | Freq: Once | ORAL | Status: AC
  Administered 2016-12-20: 1 mg via ORAL
  Filled 2016-12-20: qty 1

## 2016-12-20 MED FILL — Phenylephrine HCl Inj 10 MG/ML: INTRAMUSCULAR | Qty: 2 | Status: AC

## 2016-12-20 MED FILL — Sodium Chloride IV Soln 0.9%: INTRAVENOUS | Qty: 250 | Status: AC

## 2016-12-20 NOTE — Progress Notes (Signed)
Patient ID: Trevor [name], male   DOB: [birth year], 71 y.o.   MRN: [id number]  SICU Evening Rounds:  Hemodynamically stable MAP 83 off levophed On milrinone 0.25 Pump parameters stable after Ramp echo and decreased speed to 8800.  Started on lasix drip today at 10. Urine output a little better but not a large diuresis yet.

## 2016-12-20 NOTE — Progress Notes (Signed)
LVAD Coordinator Education Note:   Today I worked with Lonia Farber Emmerich's caregiver [name] (sister) regarding driveline exit site care, care of the driveline and lifestyle changes required with VAD. [name] assisted with the dressing change under my supervision today.   1. Discussed good hand-hygiene practice to prevent infection of the driveline site. Encouraged to maintain clean hands whenever they handle the dressing or driveline. Discussed signs and symptoms of infection and what to report to VAD Team.   2. The importance of lead immobilization has been stressed to patient and caregiver(s) using provided attachment device. Discussed appropriate placement to allow for smooth bends and to avoid tension.    3. The caregiver has demonstrated the following:              *Cleansing site with sterile technique              *Dressing care and maintenance              *Immobilizing driveline    Concerns with Technique or Ongoing Care: none  Balinda Quails RN, VAD Coordinator 24/7 pager [phone]

## 2016-12-20 NOTE — Progress Notes (Signed)
Patient ID: Trevor [name], male   DOB: 1946/10/09, 71 y.o.   MRN: [id number]    Advanced Heart Failure Rounding Note   Subjective:    Admitted with marked volume overload and low output HF.  Echo EF 20-25. Failed dual inotropes. IABP placed 2/7.   Underwent HM-II placement and TV repair on 2/9.   POD #6 today  Off NO. Off epinephrine, remains on milrinone 0.25 and norepinephrine 2.  Speed increased to 9000 rpm 2/12 to facilitate weaning pressors. Co-ox 65%.  Chest tubes out.   MAP 70s.  I/Os not negative.  CVP remains elevated ranging 16-19.     1 unit PRBCs 2/12.   Respiratory cultures with Pseudomonas, meropenem started.  Acinetobacter also isolated, discussed with ID and recommended not treating this since extubated.  Afebrile.   Ramp echo done today, speed decreased to 8800 to try to optimize RV function.   LVAD parameters:   Speed 9000  Flow 5.7 PI 4.6 Power 5.8.  5 PI events/24 hrs.   Objective:   Weight Range:  Vital Signs:   Temp:  [98.2 F (36.8 C)-98.8 F (37.1 C)] 98.4 F (36.9 C) (02/16 1230) Pulse Rate:  [34-118] 59 (02/16 1115) Resp:  [9-24] 11 (02/16 1115) BP: (62-134)/(27-98) 104/81 (02/16 1100) SpO2:  [87 %-100 %] 97 % (02/16 1115) FiO2 (%):  [32 %] 32 % (02/16 0850) Weight:  [200 lb 6.4 oz (90.9 kg)] 200 lb 6.4 oz (90.9 kg) (02/16 0500) Last BM Date: 12/15/16  Weight change: Filed Weights   12/18/16 0600 12/19/16 0500 12/20/16 0500  Weight: 195 lb 5.2 oz (88.6 kg) 199 lb 4.7 oz (90.4 kg) 200 lb 6.4 oz (90.9 kg)    Intake/Output:   Intake/Output Summary (Last 24 hours) at 12/20/16 1320 Last data filed at 12/20/16 1200  Gross per 24 hour  Intake          1612.76 ml  Output             1480 ml  Net           132.76 ml     Physical Exam: General:  NAD  HEENT: normal   Neck: supple, JVP 12-14 cm.  Cor: Sternal dressing in place. +CTs. LVAD hum  Lungs: CTAB  Abdomen: obese soft, NT. Hypoactive BS. Driveline site ok Extremities: no  cyanosis, clubbing, rash.  Trace ankle edema.  Neuro: Alert/oriented.  GU: Foley   Telemetry: Reviewed,  Atrial fibrillation, rate 80s   Labs: Basic Metabolic Panel:  Recent Labs Lab 12/14/16 0346  12/15/16 0230  12/16/16 0330  12/17/16 0400 12/17/16 0429  12/18/16 0412 12/18/16 1657 12/19/16 0400 12/19/16 1458 12/20/16 0411  NA 132*  < > 132*  < > 131*  < >  --  128*  < > 128* 128* 131* 130* 130*  K 4.2  < > 4.4  < > 3.6  < >  --  4.0  < > 4.0 3.8 3.7 3.8 3.9  CL 95*  < > 95*  < > 94*  < >  --  92*  < > 89* 93* 92* 88* 91*  CO2 26  < > 26  --  26  --   --  26  --  27  --  29  --  28  GLUCOSE 121*  < > 172*  < > 85  < >  --  68  < > 122* 107* 148* 168* 174*  BUN 32*  < > 32*  < >  38*  < >  --  45*  < > 47* 33* 54* 62* 59*  CREATININE 1.57*  < > 1.76*  < > 1.85*  < >  --  1.98*  < > 2.14* 1.50* 2.24* 2.30* 2.32*  CALCIUM 8.3*  < > 8.1*  --  8.2*  --   --  8.8*  --  8.6*  --  8.5*  --  8.8*  MG 2.7*  < > 2.3  --  2.1  --  2.2  --   --  1.9  --  2.0  --  2.0  PHOS 3.5  --  4.2  --  4.3  --  4.4  --   --   --   --   --   --   --   < > = values in this interval not displayed.  Liver Function Tests:  Recent Labs Lab 12/16/16 0330 12/17/16 0429 12/18/16 0412 12/19/16 0400 12/20/16 0411  AST 52* 43* 47* 50* 49*  ALT 29 28 35 44 46  ALKPHOS 54 65 88 102 129*  BILITOT 2.7* 2.1* 1.8* 1.8* 1.8*  PROT 5.6* 6.0* 5.6* 5.7* 6.0*  ALBUMIN 3.1* 3.2* 2.9* 2.9* 2.8*   No results for input(s): LIPASE, AMYLASE in the last 168 hours. No results for input(s): AMMONIA in the last 168 hours.  CBC:  Recent Labs Lab 12/16/16 0330  12/17/16 0400  12/18/16 0412 12/18/16 1657 12/18/16 1715 12/19/16 0400 12/19/16 1458 12/20/16 0411  WBC 16.1*  < > 15.4*  --  11.6*  --  10.9* 13.2*  --  16.6*  NEUTROABS 12.9*  --  11.7*  --  8.4*  --   --  9.9*  --  12.9*  HGB 8.1*  < > 8.8*  < > 8.6* 6.8* 8.7* 8.9* 8.5* 8.6*  HCT 23.5*  < > 26.3*  < > 25.8* 20.0* 25.7* 26.6* 25.0* 25.9*  MCV 88.3   < > 88.9  --  89.9  --  90.2 90.8  --  90.9  PLT 79*  < > 77*  --  92*  --  105* 124*  --  149*  < > = values in this interval not displayed.  Cardiac Enzymes: No results for input(s): CKTOTAL, CKMB, CKMBINDEX, TROPONINI in the last 168 hours.  BNP: BNP (last 3 results)  Recent Labs  11/28/16 1104 12/14/16 0348  BNP 1,825.3* 427.7*    ProBNP (last 3 results)  Recent Labs  11/27/16 1207  PROBNP 1,746.0*      Other results:  Imaging: Dg Chest Port 1 View  Result Date: 12/20/2016 CLINICAL DATA:  Left ventricular assist device. EXAM: PORTABLE CHEST 1 VIEW COMPARISON:  12/19/2016. FINDINGS: Cardiac pacer left ventricular assist device in stable position. Prior cardiac valve repair. Prior CABG. Stable cardiomegaly. Diffuse bilateral mild pulmonary infiltrates are noted consistent with CHF. Small bilateral pleural effusions. Interval resolution of right pneumothorax . IMPRESSION: 1.  Interval resolution of right pneumothorax. 2. Cardiac pacer and left ventricular assist device in stable position. Prior CABG and cardiac valve repair. Cardiomegaly with bilateral pulmonary infiltrates and bilateral pleural effusions consistent with mild CHF. New similar findings noted on prior exam. Electronically Signed   By: Marcello Moores  Register   On: 12/20/2016 07:59   Dg Chest Port 1 View  Result Date: 12/19/2016 CLINICAL DATA:  Status post tricuspid valve replacement and left ventricular assist device placement on December 13, 2016 EXAM: PORTABLE CHEST 1 VIEW COMPARISON:  Portable chest x-ray of December 18, 2016 FINDINGS: The right apical pneumothorax appears stable. The pulmonary interstitial markings and pulmonary vascularity remain prominent. Confluent alveolar opacity in the left perihilar region is more conspicuous today. The cardiac silhouette remains enlarged. The ICD is in stable position. The left ventricular assist device also appears to be in stable position. The prosthetic cardiac valve cage is  in stable position. The left chest tube has been removed. The left-sided PICC line tip projects over the midportion of the SVC. IMPRESSION: Stable 5% right apical pneumothorax. Stable appearance of the ICD and left ventricular assist device. The left-sided chest tube has been removed. Slight interval increase in pulmonary interstitial edema and pulmonary vascular congestion. Electronically Signed   By: David  Martinique M.D.   On: 12/19/2016 07:31     Medications:     Scheduled Medications: . sodium chloride   Intravenous Once  . sodium chloride   Intravenous Once  . amiodarone  200 mg Oral Daily  . arformoterol  15 mcg Nebulization BID  . aspirin  81 mg Oral Daily  . bisacodyl  10 mg Oral Daily   Or  . bisacodyl  10 mg Rectal Daily  . budesonide (PULMICORT) nebulizer solution  0.5 mg Nebulization BID  . busPIRone  5 mg Oral BID  . Chlorhexidine Gluconate Cloth  6 each Topical Daily  . docusate sodium  200 mg Oral Daily  . feeding supplement (GLUCERNA SHAKE)  237 mL Oral BID BM  . fluticasone  1 spray Each Nare Daily  . gabapentin  300 mg Oral Daily  . guaiFENesin  600 mg Oral BID  . insulin aspart  0-15 Units Subcutaneous TID WC  . insulin aspart  0-5 Units Subcutaneous QHS  . levothyroxine  25 mcg Oral QAC breakfast  . mouth rinse  15 mL Mouth Rinse BID  . meropenem (MERREM) IV  1 g Intravenous Q12H  . metoCLOPramide (REGLAN) injection  10 mg Intravenous Q6H  . pantoprazole  40 mg Oral Q1200  . potassium chloride  40 mEq Oral Daily  . sildenafil  20 mg Oral BID  . sodium chloride flush  10-40 mL Intracatheter Q12H  . umeclidinium bromide  1 puff Inhalation Daily  . warfarin  1 mg Oral ONCE-1800  . Warfarin - Pharmacist Dosing Inpatient   Does not apply q1800    Infusions: . sodium chloride    . sodium chloride 20 mL/hr (12/16/16 1607)  . furosemide (LASIX) infusion 10 mg/hr (12/20/16 1048)  . lactated ringers 20 mL/hr at 12/13/16 1900  . lactated ringers 20 mL/hr at 12/18/16  2200  . milrinone 0.25 mcg/kg/min (12/20/16 1108)  . norepinephrine (LEVOPHED) Adult infusion 1.5 mcg/min (12/20/16 1212)    PRN Medications: Place/Maintain arterial line **AND** sodium chloride, acetaminophen, levalbuterol, ondansetron (ZOFRAN) IV, oxyCODONE, sodium chloride flush, traMADol, traZODone   Assessment/Plan/Discussion    1. Acute on chronic systolic CHF: Ischemic cardiomyopathy but with prominent RV dysfunction on echo and exam.  [employer] ICD.  Echo this admission showed EF 20-25% with moderately dilated LV and moderately dilated/moderately dysfunctional RV with severe TR.  He was admitted with low output biventricular failure, cardiorenal syndrome, and marked volume overload. Failed dual inotrope support. IABP placed 2/7. HM-II and TV repair performed 2/9 after discussion with [facility] transplant team. POD #3 HM-II and TV repair.  Extubated 2/11, off NO, vasopressin, and epinephrine. Speed increased to 9000 rpm to facilitate weaning pressors. Remains on milrinone 0.25 and norepinephrine 2.  Still volume overloaded by exam and CVP (varies 16-19  with respiration), co-ox 65%, MAP 70s (having difficulty getting BP measured).  BUN/creatinine up a bit again.  He is not diuresing well.  Ramp echo done today, speed decreased back to 8800 to facilitate RV function.  - Continue milrinone 0.25 and norepinephrine 2 for RV support.  Will place arterial line to aide BP measurement.  - Start Lasix gtt at 10 mg/hr and will give dose of metolazone today again.  Reduction of renal venous pressure by diuresis may help renal function.   - Sildenafil begun at 20 mg tid for RV failure.  Now off NO.  - INR therapeutic.  Continue warfarin, ASA 81 daily.   2. CAD: s/p CABG.  No signs/sx of ischemia.  3. AKI on CKD stage III: Suspect cardiorenal syndrome in setting of low output failure/RV failure.  Hopefully BUN/creatinine have stabilized.  He needs more diuresis.  4. Atrial fibrillation: Chronic.  Rate is not  elevated.  - Anticoagulation per surgical team. Therapeutic on warfarin.   5. Post-op anemia: Also component of anemia of renal disease/chronic disease.  He got 1 unit PRBCs 2/12, appropriate increase in hgb, hgb stable.  6. Acute liver failure, post-op: Bili improving 5.0 -> 3.9 -> 2.7 -> 2.1 -> 1.8. Likely hypoperfusion. Holding hepato-toxic agents.  7. Aortic stenosis s/p TAVR: Stable on echo.  8. ID: Afebrile.  WBCs elevated stably.  Pseudomonas in respiratory culture, suspect tracheobronchitis.  Covering with meropenem. Acinetobacter also noted, discussed with ID and not treating at this time.  9. 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  10. Hyponatremia: Hypervolemic hyponatremia.  Fluid restrict.  11. Pocket site pain: Better with gabapentin.   35 minutes critical care time.   Loralie Champagne, MD  12/20/2016, 1:20 PM  Advanced Heart Failure Team Pager [phone] (M-F; 7a - 4p)  Please contact [facility] for night-coverage after hours (4p -7a ) and weekends on [email or web address]

## 2016-12-20 NOTE — Progress Notes (Signed)
Order was placed for arterial line.  RT attempted maximum amount of time with all attempts unsuccessful.  RN aware and will let MD know.

## 2016-12-20 NOTE — Progress Notes (Signed)
Physical Therapy Treatment Patient Details Name: Trevor [name] MRN: [id number] DOB: 11/08/1945 Today's Date: 12/20/2016    History of Present Illness  71 y.o.male with ischemic cardiomyopathy, severe tricuspid regurgitation, ejection fraction 20% and chronic renal insufficiency, CHF, CKD, DM2, asthma. The patient was in cardiogenic shock with severe volume overload and deterioration in renal function on admission s/p LVAD Heartmate II implant 2/9     PT Comments    Pt remains pleasant, moving well and aware of sternal precautions. Pt continues to require min-mod cues for power exchange as well as stating back up equipment and harness preparation. Pt able to physically transition power sources with cueing. Max assist to don/doff vest and controller strap. Will continue to follow, pt encouraged to mobilize with nursing.   HR 84-105 sats 93-99% on 3L Flow 5.8>6.7, speed 9000, PI 5.4>4.4, power 5.5>6.0  Follow Up Recommendations  Home health PT;Supervision for mobility/OOB     Equipment Recommendations       Recommendations for Other Services       Precautions / Restrictions Precautions Precautions: Sternal;Fall;Other (comment) Precaution Comments: reviewed sternal precautions, LVAD Restrictions Weight Bearing Restrictions: Yes (sternal precautions)    Mobility  Bed Mobility Overal bed mobility: Needs Assistance Bed Mobility: Sit to Supine       Sit to supine: Mod assist   General bed mobility comments: cues for sequence and assist to elevate legs onto surface as well as control trunk  Transfers Overall transfer level: Needs assistance   Transfers: Sit to/from Stand Sit to Stand: Min guard         General transfer comment: cues for hand placement and safety with no physical assist needed  Ambulation/Gait Ambulation/Gait assistance: Min assist Ambulation Distance (Feet): 450 Feet Assistive device: Rolling walker (2 wheeled) Gait Pattern/deviations:  Step-through pattern;Decreased stride length;Trunk flexed   Gait velocity interpretation: Below normal speed for age/gender General Gait Details: slow speed with cues for position in RW. grossly 10 standing rests of approximately 10 sec each   Stairs            Wheelchair Mobility    Modified Rankin (Stroke Patients Only)       Balance     Sitting balance-[name]: Good       Standing balance-[name]: Good                      Cognition Arousal/Alertness: Awake/alert Behavior During Therapy: WFL for tasks assessed/performed Overall Cognitive Status: Within Functional Limits for tasks assessed                 General Comments: A&Ox4, able to recall all sternal precautions, pt with difficulty recalling sequence for power exchange    Exercises      General Comments        Pertinent Vitals/Pain Pain Assessment: No/denies pain    Home Living                      Prior Function            PT Goals (current goals can now be found in the care plan section) Progress towards PT goals: Progressing toward goals    Frequency           PT Plan Current plan remains appropriate    Co-evaluation             End of Session Equipment Utilized During Treatment: Oxygen Activity Tolerance: Patient tolerated treatment well Patient left:  in bed;with call bell/phone within reach;with nursing/sitter in room     Time: 0844-0925 PT Time Calculation (min) (ACUTE ONLY): 41 min  Charges:  $Gait Training: 23-37 mins $Therapeutic Activity: 8-22 mins                    G Codes:      Belen Pesch B Taziyah Iannuzzi Dec 25, 2016, 9:37 AM  [name], [place]

## 2016-12-20 NOTE — Progress Notes (Signed)
ANTICOAGULATION CONSULT NOTE - Follow Up Consult  Pharmacy Consult for Bivalirudin > warfarin Indication: atrial fibrillation/LVAD  Allergies  Allergen Reactions  . No Known Allergies     Patient Measurements: Height: 5\' 3"  (160 cm) Weight: 200 lb 6.4 oz (90.9 kg) IBW/kg (Calculated) : 56.9  Vital Signs: Temp: 98.6 F (37 C) (02/16 0430) Temp Source: Oral (02/16 0430) BP: 94/70 (02/16 0715) Pulse Rate: 82 (02/16 0715)  Labs:  Recent Labs  12/18/16 1715 12/18/16 2010 12/19/16 0000 12/19/16 0400 12/19/16 1400 12/19/16 1458 12/20/16 0411  HGB 8.7*  --   --  8.9*  --  8.5* 8.6*  HCT 25.7*  --   --  26.6*  --  25.0* 25.9*  PLT 105*  --   --  124*  --   --  149*  APTT 118* 113* 56* 73*  --   --   --   LABPROT  --   --   --  27.3* 25.9*  --  28.8*  INR  --   --   --  2.48 2.32  --  2.65  CREATININE  --   --   --  2.24*  --  2.30* 2.32*    Estimated Creatinine Clearance: 29.1 mL/min (by C-G formula based on SCr of 2.32 mg/dL (H)).    Assessment: 70yom on apixaban pta for afib (last dose 1/31 @ 2110), transitioned to IV heparin for LVAD placement (2/9).   Patient now POD #6 on warfarin.  Bivalirudan initiated 2/14 until INR > 2.  INR 2.48 this am and bivalirudan stopped, bivalirudan can elevate INR so recheck done this afternoon INR 2.3> discussed with MD > will decrease warfarin dose last evening.  2/16 INR 2.6 will give warfarin 1mg  and let INR drift down some with recent amiodarone start.   PLTC improving post op 70s>124.  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  Low 4ts and low antibodies so not continuing IV anticoagulation.  Goal of Therapy:  INR 2-2.5 aPTT 50-75 seconds per PVT Monitor platelets by anticoagulation protocol: Yes   Plan:   Warfarin 1 mg tonight Daily protime, CBC  [name] Pharm.D. CPP, BCPS Clinical Pharmacist [phone] 12/20/2016 7:34 AM

## 2016-12-20 NOTE — Progress Notes (Signed)
HeartMate 2 Rounding Note  Postop day 7 implantation HeartMate 2 VAD and tricuspid valve annuloplasty repair  Subjective:   Patient with ischemic cardiomyopathy, severe tricuspid regurgitation, ejection fraction 20% and chronic renal insufficiency. The patient was in cardiogenic shock with severe volume overload and deterioration in renal function on admission. He required inotropes and subsequently balloon pump in order to optimize his hemodynamic and renal status prior to VAD implantation on February 9.  [name] surgery was complicated by moderate RV dysfunction requiring inotropes and coagulopathy requiring platelets and component transfusion. His chest tube drainage Has recently tapered off in the last chest tube was removed today. Coumadin dosing per pharmacy, INR now 2.4. Short-term postop bivalirudin bridge has been stopped.     Weight continues to increase, creat leveled at 2.3 cvp 12-15- po metolazone orders this am Nitric oxide weaned off,  epinephrine weaned off. norepi at 1-2 Maintaining current milrinone at 0.25 LVAD parameters stable with few PI events  Remains in atrial fibrillation Ramp study today LVAD INTERROGATION:  HeartMate II LVAD:  Flow 5.2 liters/min, speed 9000 RPM, power 5.1, PI 5.2.  Controller intact  Patient continues to have a rattly cough-sputum cultures now positive for Pseudomonas and meropenem started 2-14 Also colonization with Acinetobacter noted, ID recommends no formal antibiotic therapy since patient has been extubated  Patient was ambulated 300 feet  yesterday Echocardiogram performed  2-14 shows adequate RV function, no pericardial effusion, no tricuspid regurgitation, inflow cannula in good position, septum fairly midline. Plan formal ramp echo 2-16       Vital Signs:   Temp:  [97.8 F (36.6 C)-98.8 F (37.1 C)] 98.2 F (36.8 C) (02/16 0819) Pulse Rate:  [34-118] 82 (02/16 0715) Resp:  [9-26] 16 (02/16 0715) BP: (62-134)/(40-98)  94/70 (02/16 0715) SpO2:  [91 %-100 %] 98 % (02/16 0715) Weight:  [200 lb 6.4 oz (90.9 kg)] 200 lb 6.4 oz (90.9 kg) (02/16 0500) Last BM Date: 12/15/16 Mean arterial Pressure70-58mmHg  Intake/Output:   Intake/Output Summary (Last 24 hours) at 12/20/16 0829 Last data filed at 12/20/16 0700  Gross per 24 hour  Intake          2091.16 ml  Output             1410 ml  Net           681.16 ml     Physical Exam: General:  Well appearing. No resp difficulty, Comfortable in chair HEENT: normal Neck: supple. JVP normal . Carotids trace pulsatility bilat; no bruits. No lymphadenopathy or thryomegaly appreciated. Cor: Mechanical heart sounds with LVAD hum present. Lungs: clear, cough congested Abdomen: soft, nontender, nondistended. No hepatosplenomegaly. No bruits or masses. Good bowel sounds. Extremities: no cyanosis, clubbing, rash, edema Neuro: alert & orientedx3, cranial nerves grossly intact. moves all 4 extremities w/o difficulty. Affect pleasant  Telemetry: Paced 75/m, atrial fibrillation  Labs: Basic Metabolic Panel:  Recent Labs Lab 12/14/16 0346  12/15/16 0230  12/16/16 0330  12/17/16 0400 12/17/16 0429  12/18/16 0412 12/18/16 1657 12/19/16 0400 12/19/16 1458 12/20/16 0411  NA 132*  < > 132*  < > 131*  < >  --  128*  < > 128* 128* 131* 130* 130*  K 4.2  < > 4.4  < > 3.6  < >  --  4.0  < > 4.0 3.8 3.7 3.8 3.9  CL 95*  < > 95*  < > 94*  < >  --  92*  < > 89* 93* 92*  88* 91*  CO2 26  < > 26  --  26  --   --  26  --  27  --  29  --  28  GLUCOSE 121*  < > 172*  < > 85  < >  --  68  < > 122* 107* 148* 168* 174*  BUN 32*  < > 32*  < > 38*  < >  --  45*  < > 47* 33* 54* 62* 59*  CREATININE 1.57*  < > 1.76*  < > 1.85*  < >  --  1.98*  < > 2.14* 1.50* 2.24* 2.30* 2.32*  CALCIUM 8.3*  < > 8.1*  --  8.2*  --   --  8.8*  --  8.6*  --  8.5*  --  8.8*  MG 2.7*  < > 2.3  --  2.1  --  2.2  --   --  1.9  --  2.0  --  2.0  PHOS 3.5  --  4.2  --  4.3  --  4.4  --   --   --   --   --    --   --   < > = values in this interval not displayed.  Liver Function Tests:  Recent Labs Lab 12/16/16 0330 12/17/16 0429 12/18/16 0412 12/19/16 0400 12/20/16 0411  AST 52* 43* 47* 50* 49*  ALT 29 28 35 44 46  ALKPHOS 54 65 88 102 129*  BILITOT 2.7* 2.1* 1.8* 1.8* 1.8*  PROT 5.6* 6.0* 5.6* 5.7* 6.0*  ALBUMIN 3.1* 3.2* 2.9* 2.9* 2.8*   No results for input(s): LIPASE, AMYLASE in the last 168 hours. No results for input(s): AMMONIA in the last 168 hours.  CBC:  Recent Labs Lab 12/16/16 0330  12/17/16 0400  12/18/16 0412 12/18/16 1657 12/18/16 1715 12/19/16 0400 12/19/16 1458 12/20/16 0411  WBC 16.1*  < > 15.4*  --  11.6*  --  10.9* 13.2*  --  16.6*  NEUTROABS 12.9*  --  11.7*  --  8.4*  --   --  9.9*  --  12.9*  HGB 8.1*  < > 8.8*  < > 8.6* 6.8* 8.7* 8.9* 8.5* 8.6*  HCT 23.5*  < > 26.3*  < > 25.8* 20.0* 25.7* 26.6* 25.0* 25.9*  MCV 88.3  < > 88.9  --  89.9  --  90.2 90.8  --  90.9  PLT 79*  < > 77*  --  92*  --  105* 124*  --  149*  < > = values in this interval not displayed.  INR:  Recent Labs Lab 12/18/16 0412 12/18/16 1339 12/19/16 0400 12/19/16 1400 12/20/16 0411  INR 1.58 1.99 2.48 2.32 2.65    Other results:  EKG:   Imaging: Dg Chest Port 1 View  Result Date: 12/20/2016 CLINICAL DATA:  Left ventricular assist device. EXAM: PORTABLE CHEST 1 VIEW COMPARISON:  12/19/2016. FINDINGS: Cardiac pacer left ventricular assist device in stable position. Prior cardiac valve repair. Prior CABG. Stable cardiomegaly. Diffuse bilateral mild pulmonary infiltrates are noted consistent with CHF. Small bilateral pleural effusions. Interval resolution of right pneumothorax . IMPRESSION: 1.  Interval resolution of right pneumothorax. 2. Cardiac pacer and left ventricular assist device in stable position. Prior CABG and cardiac valve repair. Cardiomegaly with bilateral pulmonary infiltrates and bilateral pleural effusions consistent with mild CHF. New similar findings  noted on prior exam. Electronically Signed   By: [place]   On:  12/20/2016 07:59   Dg Chest Port 1 View  Result Date: 12/19/2016 CLINICAL DATA:  Status post tricuspid valve replacement and left ventricular assist device placement on December 13, 2016 EXAM: PORTABLE CHEST 1 VIEW COMPARISON:  Portable chest x-ray of December 18, 2016 FINDINGS: The right apical pneumothorax appears stable. The pulmonary interstitial markings and pulmonary vascularity remain prominent. Confluent alveolar opacity in the left perihilar region is more conspicuous today. The cardiac silhouette remains enlarged. The ICD is in stable position. The left ventricular assist device also appears to be in stable position. The prosthetic cardiac valve cage is in stable position. The left chest tube has been removed. The left-sided PICC line tip projects over the midportion of the SVC. IMPRESSION: Stable 5% right apical pneumothorax. Stable appearance of the ICD and left ventricular assist device. The left-sided chest tube has been removed. Slight interval increase in pulmonary interstitial edema and pulmonary vascular congestion. Electronically Signed   By: David  [place] M.D.   On: 12/19/2016 07:31   Dg Chest Port 1 View  Result Date: 12/18/2016 CLINICAL DATA:  Postop left ventricular assist device EXAM: PORTABLE CHEST 1 VIEW COMPARISON:  12/17/2016 FINDINGS: Cardiomediastinal silhouette is stable. Single lead cardiac pacemaker is unchanged in position. Status post median sternotomy. Cardiac valve prosthesis again noted. Left ventricular assist device is unchanged in position. Stable left chest tube position. The right chest tube has been removed. There is small right apical pneumothorax about 5%. No infiltrate or pulmonary edema. Bilateral basilar atelectasis again noted. IMPRESSION: Status post median sternotomy. Cardiac valve prosthesis again noted. Left ventricular assist device is unchanged in position. Stable left chest tube  position. The right chest tube has been removed. There is small right apical pneumothorax about 5%. No infiltrate or pulmonary edema. Bilateral basilar atelectasis again noted. Electronically Signed   By: Lahoma Crocker M.D.   On: 12/18/2016 08:51     Medications:     Scheduled Medications: . sodium chloride   Intravenous Once  . sodium chloride   Intravenous Once  . amiodarone  200 mg Oral Daily  . arformoterol  15 mcg Nebulization BID  . aspirin  81 mg Oral Daily  . bisacodyl  10 mg Oral Daily   Or  . bisacodyl  10 mg Rectal Daily  . budesonide (PULMICORT) nebulizer solution  0.5 mg Nebulization BID  . busPIRone  5 mg Oral BID  . Chlorhexidine Gluconate Cloth  6 each Topical Daily  . docusate sodium  200 mg Oral Daily  . feeding supplement (GLUCERNA SHAKE)  237 mL Oral BID BM  . fluticasone  1 spray Each Nare Daily  . furosemide  80 mg Intravenous BID  . gabapentin  300 mg Oral Daily  . guaiFENesin  600 mg Oral BID  . insulin aspart  0-15 Units Subcutaneous TID WC  . insulin aspart  0-5 Units Subcutaneous QHS  . lactulose  30 g Oral Once  . levothyroxine  25 mcg Oral QAC breakfast  . mouth rinse  15 mL Mouth Rinse BID  . meropenem (MERREM) IV  1 g Intravenous Q12H  . metoCLOPramide (REGLAN) injection  10 mg Intravenous Q6H  . metolazone  5 mg Oral Once  . pantoprazole  40 mg Oral Q1200  . potassium chloride  40 mEq Oral Daily  . sildenafil  20 mg Oral BID  . sodium chloride flush  10-40 mL Intracatheter Q12H  . sodium chloride flush  10-40 mL Intracatheter Q12H  . sodium chloride flush  3  mL Intravenous Q12H  . umeclidinium bromide  1 puff Inhalation Daily  . warfarin  1 mg Oral ONCE-1800  . Warfarin - Pharmacist Dosing Inpatient   Does not apply q1800    Infusions: . sodium chloride    . sodium chloride 20 mL/hr (12/16/16 1607)  . lactated ringers 20 mL/hr at 12/13/16 1900  . lactated ringers 20 mL/hr at 12/18/16 2200  . milrinone 0.25 mcg/kg/min (12/19/16 1715)  .  norepinephrine (LEVOPHED) Adult infusion 2 mcg/min (12/20/16 0700)    PRN Medications: acetaminophen, levalbuterol, ondansetron (ZOFRAN) IV, oxyCODONE, sodium chloride flush, sodium chloride flush, sodium chloride flush, traMADol, traZODone   Assessment:  Acute on chronic systolic heart failure from ischemic cardiomyopathy treated with HeartMate 2 implantation for destination therapy indication Moderate RV failure requiring inotropes and inhaled nitric oxide Preoperative acute on chronic renal failure Severe tricuspid regurgitation corrected with 28 mm ring annuloplasty Preoperative COPD-asthmatic bronchitis with low diffusion capacity less than 40% predicted Postoperative coagulopathy and expected acute blood loss anemia Acute liver failure  bilirubin 5.0 postop now improving < 3 Preoperative hyponatremia Postoperative HCAP Gram-negative rod-Pseudomonas from tracheal aspirate-Day 3 meropenem. White count 13-16k  Plan/Discussion:   Mobilize with PT Creatinine increased to 2.3, continue diuresis Continue twice a day IV Lasix Patient and family education with exit site dressings in progress Continue ambulation One week of IV antibiotics for gram-negative tracheal- bronchitis with heavy airway secretions stop date 2-22  Almost ready for tx to stepdown- needs fluid off first  I reviewed the LVAD parameters from today, and compared the results to the patient's prior recorded data.  No programming changes were made.  The LVAD is functioning within specified parameters.  The patient performs LVAD self-test daily.  LVAD interrogation was negative for any significant power changes, alarms or PI events/speed drops.  LVAD equipment check completed and is in good working order.  Back-up equipment present.   LVAD education done on emergency procedures and precautions and reviewed exit site care.  Length of Stay: [place] III 12/20/2016, 8:29 AM

## 2016-12-20 NOTE — Progress Notes (Signed)
Late entry note: Spoke with patient on Monday 2/12 regarding concerns for possible awareness under anesthesia. During interview pt appeared in no acute distress and spoke calmly and candidly. Per pt he reported that he saw Dr. Prescott Gum operating and that he "felt his chest being cracked open", and had trouble going back to sleep after that. He admitted to our CRNA and nursing staff that he had watched several videos about the surgery and that his biggest fear prior to surgery was being awake during the surgery. I spoke with him regarding what he describes as being more likely a dream or hallucination than awareness. I explained that while he was at high risk from an awareness standpoint both from a patient perspective but also a surgical perspective, it is very unlikely that he saw Dr. Prescott Gum operating as his eyes were taped shut during surgery and he had drapes over his head for the entire case. Additionally, I informed him that he had appropriate levels of inhalational anesthetics the entire case, large doses of benzodiazipines and his BIS monitor read appropriately the entire case. He voiced his understanding of the situation and admitted that it was likely a dream given the circumstances. I told him that I was very happy to see him doing so well following such a big surgery and that if he had any more questions or concerns I would certainly come and answer them. He expressed appreciation for coming and talking to him.  Suzette Battiest, MD

## 2016-12-20 NOTE — Progress Notes (Signed)
Ramp Echo Procedure note:  Speed Flow PI Power LVIDD AOV    MR    TR       Septum            RV  8800      5.9      4.8      5.4         5.2             4/4           Trace      trace            midline        Mild hypokinesis  9000       6.1     4.7    5.8           5               unable      Trace      trace        Minimal        9200       6.2      4.4    6.2          4.9           unable                          Trivial           Left   Ramp ECHO performed at bedside with Dr Aundra Dubin. Patient's set speed 8800 and back-up speed 8200. Backup controller adjusted for new parameters. All patient questions answered.

## 2016-12-20 NOTE — Plan of Care (Signed)
Problem: Activity: Goal: Risk for activity intolerance will decrease Outcome: Progressing Pt walked 300 ft w/ pt  Problem: Education: Goal: Knowledge of the prescribed therapeutic regimen will improve Outcome: Progressing Pt being educated as medications are added  Problem: Fluid Volume: Goal: Risk for excess fluid volume will decrease Outcome: Progressing BID lasix

## 2016-12-20 NOTE — Progress Notes (Signed)
Occupational Therapy Treatment Patient Details Name: Trevor [name] MRN: [id number] DOB: 1946-02-22 Today's Date: 12/20/2016    History of present illness  71 y.o.male with ischemic cardiomyopathy, severe tricuspid regurgitation, ejection fraction 20% and chronic renal insufficiency, CHF, CKD, DM2, asthma. The patient was in cardiogenic shock with severe volume overload and deterioration in renal function on admission s/p LVAD Heartmate II implant 2/9    OT comments  Pt sleepy this afternoon, only tolerating 2 grooming activities in sitting. Pt able to state sternal precautions. Will attempt to see pt during the morning next visit.  Follow Up Recommendations  No OT follow up    Equipment Recommendations  3 in 1 bedside commode    Recommendations for Other Services      Precautions / Restrictions Precautions Precautions: Sternal;Fall Precaution Comments: reviewed sternal precautions, LVAD Restrictions Weight Bearing Restrictions: Yes Other Position/Activity Restrictions: sternal precautions; no push/pull bilateral UE       Mobility Bed Mobility Overal bed mobility: Needs Assistance Bed Mobility: Supine to Sit;Sit to Supine     Supine to sit: Mod assist Sit to supine: Mod assist   General bed mobility comments: assist to raise trunk and for LEs back into bed  Transfers                      Balance Overall balance assessment: Needs assistance   Sitting balance-[name]: Good Sitting balance - Comments: sat EOB x 5 min without LOB                           ADL Overall ADL's : Needs assistance/impaired     Grooming: Wash/dry hands;Wash/dry face;Sitting;Supervision/safety;Set up                                 General ADL Comments: Pt performed 2 grooming activities at EOB. Requested to brush teeth, so returned pt to supine while retrieving items. Upon return pt sleepy and fatigued and unable to further participate.        Vision                     Perception     Praxis      Cognition   Behavior During Therapy: WFL for tasks assessed/performed Overall Cognitive Status: Within Functional Limits for tasks assessed                  General Comments: pt able to recall sternal precautions    Extremity/Trunk Assessment               Exercises     Shoulder Instructions       General Comments      Pertinent Vitals/ Pain       Pain Assessment: Faces Faces Pain Scale: Hurts little more Pain Location: incision with coughing Pain Descriptors / Indicators: Sore Pain Intervention(s): Other (comment) (encouraged splinting with heart pillow)  Home Living                                          Prior Functioning/Environment              Frequency  Min 3X/week        Progress Toward Goals  OT Goals(current goals can now be found  in the care plan section)  Progress towards OT goals: Not progressing toward goals - comment (fatigue)  Acute Rehab OT Goals Patient Stated Goal: to live longer Time For Goal Achievement: 12/31/16 Potential to Achieve Goals: Good  Plan Discharge plan remains appropriate    Co-evaluation                 End of Session Equipment Utilized During Treatment: Oxygen   Activity Tolerance Patient limited by fatigue   Patient Left in bed;with call bell/phone within reach   Nurse Communication  (nurse reports pt is usually more awake around 6pm)        Time: 1420-1440 OT Time Calculation (min): 20 min  Charges: OT General Charges $OT Visit: 1 Procedure OT Treatments $Self Care/Home Management : 8-22 mins  Malka So 12/20/2016, 2:43 PM  [phone]

## 2016-12-20 NOTE — Addendum Note (Signed)
Addendum  created 12/20/16 1439 by Suzette Battiest, MD   Sign clinical note

## 2016-12-21 ENCOUNTER — Inpatient Hospital Stay (HOSPITAL_COMMUNITY): Payer: Medicare Other | Admitting: Anesthesiology

## 2016-12-21 ENCOUNTER — Inpatient Hospital Stay (HOSPITAL_COMMUNITY): Payer: Medicare Other

## 2016-12-21 LAB — CBC
HCT: 24.9 % — ABNORMAL LOW (ref 39.0–52.0)
Hemoglobin: 8.3 g/dL — ABNORMAL LOW (ref 13.0–17.0)
MCH: 30.4 pg (ref 26.0–34.0)
MCHC: 33.3 g/dL (ref 30.0–36.0)
MCV: 91.2 fL (ref 78.0–100.0)
Platelets: 180 10*3/uL (ref 150–400)
RBC: 2.73 MIL/uL — ABNORMAL LOW (ref 4.22–5.81)
RDW: 17 % — ABNORMAL HIGH (ref 11.5–15.5)
WBC: 16 10*3/uL — ABNORMAL HIGH (ref 4.0–10.5)

## 2016-12-21 LAB — GLUCOSE, CAPILLARY
GLUCOSE-CAPILLARY: 166 mg/dL — AB (ref 65–99)
GLUCOSE-CAPILLARY: 143 mg/dL — AB (ref 65–99)
Glucose-Capillary: 136 mg/dL — ABNORMAL HIGH (ref 65–99)
Glucose-Capillary: 212 mg/dL — ABNORMAL HIGH (ref 65–99)

## 2016-12-21 LAB — COMPREHENSIVE METABOLIC PANEL
ALT: 48 U/L (ref 17–63)
AST: 55 U/L — AB (ref 15–41)
Albumin: 2.7 g/dL — ABNORMAL LOW (ref 3.5–5.0)
Alkaline Phosphatase: 133 U/L — ABNORMAL HIGH (ref 38–126)
Anion gap: 9 (ref 5–15)
BUN: 67 mg/dL — AB (ref 6–20)
CHLORIDE: 93 mmol/L — AB (ref 101–111)
CO2: 30 mmol/L (ref 22–32)
CREATININE: 2.38 mg/dL — AB (ref 0.61–1.24)
Calcium: 8.7 mg/dL — ABNORMAL LOW (ref 8.9–10.3)
GFR calc Af Amer: 30 mL/min — ABNORMAL LOW (ref >60)
GFR calc non Af Amer: 26 mL/min — ABNORMAL LOW (ref >60)
Glucose, Bld: 162 mg/dL — ABNORMAL HIGH (ref 65–99)
Potassium: 3.6 mmol/L (ref 3.5–5.1)
SODIUM: 132 mmol/L — AB (ref 135–145)
Total Bilirubin: 1.6 mg/dL — ABNORMAL HIGH (ref 0.3–1.2)
Total Protein: 5.8 g/dL — ABNORMAL LOW (ref 6.5–8.1)

## 2016-12-21 LAB — PROTIME-INR
INR: 2.03
Prothrombin Time: 23.2 seconds — ABNORMAL HIGH (ref 11.4–15.2)

## 2016-12-21 LAB — COOXEMETRY PANEL
Carboxyhemoglobin: 2.2 % — ABNORMAL HIGH (ref 0.5–1.5)
Methemoglobin: 0.8 % (ref 0.0–1.5)
O2 Saturation: 74.8 %
Total hemoglobin: 7.7 g/dL — ABNORMAL LOW (ref 12.0–16.0)

## 2016-12-21 LAB — LACTATE DEHYDROGENASE: LDH: 289 U/L — ABNORMAL HIGH (ref 98–192)

## 2016-12-21 MED ORDER — WARFARIN SODIUM 2.5 MG PO TABS
2.5000 mg | ORAL_TABLET | Freq: Once | ORAL | Status: AC
  Administered 2016-12-21: 2.5 mg via ORAL
  Filled 2016-12-21: qty 1

## 2016-12-21 MED ORDER — SODIUM CHLORIDE 0.9 % IV SOLN: INTRAVENOUS | Status: DC

## 2016-12-21 NOTE — Anesthesia Procedure Notes (Signed)
Arterial Line Insertion Performed by: Suzette Battiest, anesthesiologist  Patient location: Pre-op. Preanesthetic checklist: patient identified, IV checked, site marked, risks and benefits discussed, surgical consent, monitors and equipment checked, pre-op evaluation, timeout performed and anesthesia consent Lidocaine 1% used for infiltration Right, radial was placed Catheter size: 20 Fr Hand hygiene performed , maximum sterile barriers used  and Seldinger technique used  Attempts: 2 Procedure performed using ultrasound guided technique. Ultrasound Notes:anatomy identified, needle tip was noted to be adjacent to the nerve/plexus identified and no ultrasound evidence of intravascular and/or intraneural injection Following insertion, dressing applied and Biopatch. Post procedure assessment: normal and unchanged  Patient tolerated the procedure well with no immediate complications.

## 2016-12-21 NOTE — Progress Notes (Signed)
Patient ID: Ramond Boleyn, male   DOB: 26-Jul-1946, 71 y.o.   MRN: [id number] HeartMate 2 Rounding Note  Subjective:    Feels ok. No pain unless coughing. Still bringing up some sputum. Culture gew pseudomonas and on Meropenem.  Ambulating Bowels working.   LVAD INTERROGATION:  HeartMate II LVAD:  Flow 5.4 liters/min, speed 8800, power 5, PI 5.7.  Multiple PI events overnight and this am.  Objective:    Vital Signs:   Temp:  [97.2 F (36.2 C)-98.8 F (37.1 C)] 97.2 F (36.2 C) (02/17 1209) Pulse Rate:  [32-126] 126 (02/17 1500) Resp:  [9-23] 11 (02/17 1600) BP: (74-169)/(56-120) 85/74 (02/17 1600) SpO2:  [92 %-100 %] 99 % (02/17 1500) Arterial Line BP: (68-100)/(58-73) 85/65 (02/17 1600) Weight:  [91.3 kg (201 lb 3.2 oz)] 91.3 kg (201 lb 3.2 oz) (02/17 0745) Last BM Date: 12/20/16 Mean arterial Pressure 80 by arterial line. Pulsatile waveform.  Intake/Output:   Intake/Output Summary (Last 24 hours) at 12/21/16 1625 Last data filed at 12/21/16 1549  Gross per 24 hour  Intake           1581.1 ml  Output             2390 ml  Net           -808.9 ml     Physical Exam: General:  Looks tired,  No resp difficulty HEENT: normal Cor: normal heart sounds with LVAD hum present. Chest incision ok Lungs: diminished in bases Abdomen: soft, nontender, nondistended. Good bowel sounds. Extremities: moderate LE edema Neuro: alert & orientedx3, Affect pleasant  Telemetry: atrial fib 80's  Labs: Basic Metabolic Panel:  Recent Labs Lab 12/15/16 0230  12/16/16 0330  12/17/16 0400 12/17/16 0429  12/18/16 0412 12/18/16 1657 12/19/16 0400 12/19/16 1458 12/20/16 0411 12/21/16 0258  NA 132*  < > 131*  < >  --  128*  < > 128* 128* 131* 130* 130* 132*  K 4.4  < > 3.6  < >  --  4.0  < > 4.0 3.8 3.7 3.8 3.9 3.6  CL 95*  < > 94*  < >  --  92*  < > 89* 93* 92* 88* 91* 93*  CO2 26  --  26  --   --  26  --  27  --  29  --  28 30  GLUCOSE 172*  < > 85  < >  --  68  < > 122*  107* 148* 168* 174* 162*  BUN 32*  < > 38*  < >  --  45*  < > 47* 33* 54* 62* 59* 67*  CREATININE 1.76*  < > 1.85*  < >  --  1.98*  < > 2.14* 1.50* 2.24* 2.30* 2.32* 2.38*  CALCIUM 8.1*  --  8.2*  --   --  8.8*  --  8.6*  --  8.5*  --  8.8* 8.7*  MG 2.3  --  2.1  --  2.2  --   --  1.9  --  2.0  --  2.0  --   PHOS 4.2  --  4.3  --  4.4  --   --   --   --   --   --   --   --   < > = values in this interval not displayed.  Liver Function Tests:  Recent Labs Lab 12/17/16 0429 12/18/16 0412 12/19/16 0400 12/20/16 0411 12/21/16 0258  AST 43* 47*  50* 49* 55*  ALT 28 35 44 46 48  ALKPHOS 65 88 102 129* 133*  BILITOT 2.1* 1.8* 1.8* 1.8* 1.6*  PROT 6.0* 5.6* 5.7* 6.0* 5.8*  ALBUMIN 3.2* 2.9* 2.9* 2.8* 2.7*   No results for input(s): LIPASE, AMYLASE in the last 168 hours. No results for input(s): AMMONIA in the last 168 hours.  CBC:  Recent Labs Lab 12/16/16 0330  12/17/16 0400  12/18/16 0412  12/18/16 1715 12/19/16 0400 12/19/16 1458 12/20/16 0411 12/21/16 0258  WBC 16.1*  < > 15.4*  --  11.6*  --  10.9* 13.2*  --  16.6* 16.0*  NEUTROABS 12.9*  --  11.7*  --  8.4*  --   --  9.9*  --  12.9*  --   HGB 8.1*  < > 8.8*  < > 8.6*  < > 8.7* 8.9* 8.5* 8.6* 8.3*  HCT 23.5*  < > 26.3*  < > 25.8*  < > 25.7* 26.6* 25.0* 25.9* 24.9*  MCV 88.3  < > 88.9  --  89.9  --  90.2 90.8  --  90.9 91.2  PLT 79*  < > 77*  --  92*  --  105* 124*  --  149* 180  < > = values in this interval not displayed.  INR:  Recent Labs Lab 12/18/16 1339 12/19/16 0400 12/19/16 1400 12/20/16 0411 12/21/16 0258  INR 1.99 2.48 2.32 2.65 2.03    Other results:  EKG:   Imaging: Dg Chest Port 1 View  Result Date: 12/21/2016 CLINICAL DATA:  LVAD EXAM: PORTABLE CHEST 1 VIEW COMPARISON:  12/20/2016 FINDINGS: Minimally seen LVAD. There is a transcatheter aortic valve replacement. ICD/pacer lead from the right without detected change, tip not visualized. Left upper extremity PICC and subclavian line are in  unchanged position. Trace left apical pneumothorax, in retrospect stable from yesterday. Low volume chest with basilar haziness greater on the right, likely atelectasis. [name] nurse is currently busy with a patient, RN Alyse Low will relay the results to her when she is free. IMPRESSION: 1. Trace left apical pneumothorax, in retrospect stable. No visible recurrence of the right pneumothorax. 2. Low volume chest with atelectasis. Electronically Signed   By: Monte Fantasia M.D.   On: 12/21/2016 07:39   Dg Chest Port 1 View  Result Date: 12/20/2016 CLINICAL DATA:  Left ventricular assist device. EXAM: PORTABLE CHEST 1 VIEW COMPARISON:  12/19/2016. FINDINGS: Cardiac pacer left ventricular assist device in stable position. Prior cardiac valve repair. Prior CABG. Stable cardiomegaly. Diffuse bilateral mild pulmonary infiltrates are noted consistent with CHF. Small bilateral pleural effusions. Interval resolution of right pneumothorax . IMPRESSION: 1.  Interval resolution of right pneumothorax. 2. Cardiac pacer and left ventricular assist device in stable position. Prior CABG and cardiac valve repair. Cardiomegaly with bilateral pulmonary infiltrates and bilateral pleural effusions consistent with mild CHF. New similar findings noted on prior exam. Electronically Signed   By: [place]   On: 12/20/2016 07:59      Medications:     Scheduled Medications: . sodium chloride   Intravenous Once  . sodium chloride   Intravenous Once  . amiodarone  200 mg Oral Daily  . arformoterol  15 mcg Nebulization BID  . aspirin  81 mg Oral Daily  . bisacodyl  10 mg Oral Daily   Or  . bisacodyl  10 mg Rectal Daily  . budesonide (PULMICORT) nebulizer solution  0.5 mg Nebulization BID  . busPIRone  5 mg Oral BID  .  Chlorhexidine Gluconate Cloth  6 each Topical Daily  . docusate sodium  200 mg Oral Daily  . feeding supplement (GLUCERNA SHAKE)  237 mL Oral BID BM  . fluticasone  1 spray Each Nare Daily  .  gabapentin  300 mg Oral Daily  . guaiFENesin  600 mg Oral BID  . insulin aspart  0-15 Units Subcutaneous TID WC  . insulin aspart  0-5 Units Subcutaneous QHS  . levothyroxine  25 mcg Oral QAC breakfast  . mouth rinse  15 mL Mouth Rinse BID  . meropenem (MERREM) IV  1 g Intravenous Q12H  . metoCLOPramide (REGLAN) injection  10 mg Intravenous Q6H  . pantoprazole  40 mg Oral Q1200  . potassium chloride  40 mEq Oral Daily  . sildenafil  20 mg Oral BID  . sodium chloride flush  10-40 mL Intracatheter Q12H  . umeclidinium bromide  1 puff Inhalation Daily  . warfarin  2.5 mg Oral ONCE-1800  . Warfarin - Pharmacist Dosing Inpatient   Does not apply q1800     Infusions: . sodium chloride    . sodium chloride 20 mL/hr (12/16/16 1607)  . furosemide (LASIX) infusion 10 mg/hr (12/21/16 1500)  . lactated ringers 20 mL/hr at 12/13/16 1900  . lactated ringers 20 mL/hr at 12/21/16 1500  . milrinone 0.25 mcg/kg/min (12/21/16 1500)  . norepinephrine (LEVOPHED) Adult infusion 2 mcg/min (12/21/16 1510)     PRN Medications:  Place/Maintain arterial line **AND** sodium chloride, acetaminophen, levalbuterol, ondansetron (ZOFRAN) IV, oxyCODONE, sodium chloride flush, traMADol, traZODone   Assessment:   1. Acute on chronic systolic heart failure due to ischemic cardiomyopathy with RV dysfunction and severe TR. POD 8 s/p Heart Mate 2 with TV repair. He is hemodynamically stable on Milrinone 0.25 and Levophed 1 mcg. Co-ox 75% this am. CVP remains elevated at 15-16 and weight is 20 lbs over preop.  2. Acute on chronic kidney disease, stage III. He is diuresing some on lasix drip but I=O and weight trending up if anything.   3. Acute hepatic failure due to preop low cardiac output. This is much improved.   4. Probable tracheobronchitis with rattling cough and Pseudomonas in sputum. CXR shows bilateral LL atelectasis. Continue Meropenem. Remains afebrile and WBC stable at 16K.  5. 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 He is therapeutic on coumadin so no other treatment indicated even if this was HIT. Plts continue to rise.  6. AS s/p TAVR.   7. Rate controlled atrial fib.  8. Therapeutic on coumadin for atrial fib and LVAD.    Plan/Discussion:    Overall he is fairly stable. Continue gentle diuresis given kidney disease. Maintain adequate MAP. Continue Milrinone for now with RV dysfunction. Encourage nutrition, IS, ambulation. Will wait to transfer to stepdown until we are sure kidney function is stable and volume excess improving.  I reviewed the LVAD parameters from today, and compared the results to the patient's prior recorded data.  No programming changes were made.  The LVAD is functioning within specified parameters.    LVAD equipment check completed and is in good working order.  Back-up equipment present.   Length of Stay: [address]  Fernande Boyden [facility] 12/21/2016, 4:25 PM

## 2016-12-21 NOTE — Progress Notes (Signed)
Patient ID: Trevor [name], male   DOB: [birth year], 71 y.o.   MRN: [id number]    Advanced Heart Failure Rounding Note   Subjective:    Admitted with marked volume overload and low output HF.  Echo EF 20-25. Failed dual inotropes. IABP placed 2/7.   Underwent HM-II placement and TV repair on 2/9.   Off NO. Off epinephrine, remains on milrinone 0.25 and norepinephrine 1.  BP has been difficult to obtain at times, unable to get arterial line yesterday.  Chest tubes out.  Ramp echo on 2/16, speed decreased to 8800 to optimize RV.   MAP 70s.  I/Os fairly even despite Lasix gtt.  CVP remains elevated ranging 14-16.      1 unit PRBCs 2/12.   Respiratory cultures with Pseudomonas, meropenem started.  Acinetobacter also isolated, discussed with ID and recommended not treating this since extubated.  Afebrile.   LVAD parameters:   Speed 8800  Flow 6 PI 4.7 Power 5.4.  5 PI events/24 hrs.   Objective:   Weight Range:  Vital Signs:   Temp:  [98.2 F (36.8 C)-98.8 F (37.1 C)] 98.5 F (36.9 C) (02/17 0350) Pulse Rate:  [36-105] 101 (02/17 0700) Resp:  [9-23] 12 (02/17 0700) BP: (70-118)/(27-93) 84/70 (02/17 0700) SpO2:  [87 %-100 %] 97 % (02/17 0700) FiO2 (%):  [32 %] 32 % (02/16 0850) Last BM Date: 12/20/16  Weight change: Filed Weights   12/18/16 0600 12/19/16 0500 12/20/16 0500  Weight: 195 lb 5.2 oz (88.6 kg) 199 lb 4.7 oz (90.4 kg) 200 lb 6.4 oz (90.9 kg)    Intake/Output:   Intake/Output Summary (Last 24 hours) at 12/21/16 0735 Last data filed at 12/21/16 0700  Gross per 24 hour  Intake           1338.4 ml  Output             1380 ml  Net            -41.6 ml     Physical Exam: General:  NAD  HEENT: normal   Neck: supple, JVP 12-14 cm.  Cor: Sternal dressing in place. +CTs. LVAD hum  Lungs: CTAB  Abdomen: obese soft, NT. Normal BS. Driveline site ok Extremities: no cyanosis, clubbing, rash.  1+ edema lower leg L>R.  Neuro: Alert/oriented.  GU: Foley    Telemetry: Reviewed,  Atrial fibrillation, rate 80s   Labs: Basic Metabolic Panel:  Recent Labs Lab 12/15/16 0230  12/16/16 0330  12/17/16 0400 12/17/16 0429  12/18/16 0412 12/18/16 1657 12/19/16 0400 12/19/16 1458 12/20/16 0411 12/21/16 0258  NA 132*  < > 131*  < >  --  128*  < > 128* 128* 131* 130* 130* 132*  K 4.4  < > 3.6  < >  --  4.0  < > 4.0 3.8 3.7 3.8 3.9 3.6  CL 95*  < > 94*  < >  --  92*  < > 89* 93* 92* 88* 91* 93*  CO2 26  --  26  --   --  26  --  27  --  29  --  28 30  GLUCOSE 172*  < > 85  < >  --  68  < > 122* 107* 148* 168* 174* 162*  BUN 32*  < > 38*  < >  --  45*  < > 47* 33* 54* 62* 59* 67*  CREATININE 1.76*  < > 1.85*  < >  --  1.98*  < >  2.14* 1.50* 2.24* 2.30* 2.32* 2.38*  CALCIUM 8.1*  --  8.2*  --   --  8.8*  --  8.6*  --  8.5*  --  8.8* 8.7*  MG 2.3  --  2.1  --  2.2  --   --  1.9  --  2.0  --  2.0  --   PHOS 4.2  --  4.3  --  4.4  --   --   --   --   --   --   --   --   < > = values in this interval not displayed.  Liver Function Tests:  Recent Labs Lab 12/17/16 0429 12/18/16 0412 12/19/16 0400 12/20/16 0411 12/21/16 0258  AST 43* 47* 50* 49* 55*  ALT 28 35 44 46 48  ALKPHOS 65 88 102 129* 133*  BILITOT 2.1* 1.8* 1.8* 1.8* 1.6*  PROT 6.0* 5.6* 5.7* 6.0* 5.8*  ALBUMIN 3.2* 2.9* 2.9* 2.8* 2.7*   No results for input(s): LIPASE, AMYLASE in the last 168 hours. No results for input(s): AMMONIA in the last 168 hours.  CBC:  Recent Labs Lab 12/16/16 0330  12/17/16 0400  12/18/16 0412  12/18/16 1715 12/19/16 0400 12/19/16 1458 12/20/16 0411 12/21/16 0258  WBC 16.1*  < > 15.4*  --  11.6*  --  10.9* 13.2*  --  16.6* 16.0*  NEUTROABS 12.9*  --  11.7*  --  8.4*  --   --  9.9*  --  12.9*  --   HGB 8.1*  < > 8.8*  < > 8.6*  < > 8.7* 8.9* 8.5* 8.6* 8.3*  HCT 23.5*  < > 26.3*  < > 25.8*  < > 25.7* 26.6* 25.0* 25.9* 24.9*  MCV 88.3  < > 88.9  --  89.9  --  90.2 90.8  --  90.9 91.2  PLT 79*  < > 77*  --  92*  --  105* 124*  --  149* 180   < > = values in this interval not displayed.  Cardiac Enzymes: No results for input(s): CKTOTAL, CKMB, CKMBINDEX, TROPONINI in the last 168 hours.  BNP: BNP (last 3 results)  Recent Labs  11/28/16 1104 12/14/16 0348  BNP 1,825.3* 427.7*    ProBNP (last 3 results)  Recent Labs  11/27/16 1207  PROBNP 1,746.0*      Other results:  Imaging: Dg Chest Port 1 View  Result Date: 12/20/2016 CLINICAL DATA:  Left ventricular assist device. EXAM: PORTABLE CHEST 1 VIEW COMPARISON:  12/19/2016. FINDINGS: Cardiac pacer left ventricular assist device in stable position. Prior cardiac valve repair. Prior CABG. Stable cardiomegaly. Diffuse bilateral mild pulmonary infiltrates are noted consistent with CHF. Small bilateral pleural effusions. Interval resolution of right pneumothorax . IMPRESSION: 1.  Interval resolution of right pneumothorax. 2. Cardiac pacer and left ventricular assist device in stable position. Prior CABG and cardiac valve repair. Cardiomegaly with bilateral pulmonary infiltrates and bilateral pleural effusions consistent with mild CHF. New similar findings noted on prior exam. Electronically Signed   By: [place]   On: 12/20/2016 07:59     Medications:     Scheduled Medications: . sodium chloride   Intravenous Once  . sodium chloride   Intravenous Once  . amiodarone  200 mg Oral Daily  . arformoterol  15 mcg Nebulization BID  . aspirin  81 mg Oral Daily  . bisacodyl  10 mg Oral Daily   Or  . bisacodyl  10 mg Rectal Daily  .  budesonide (PULMICORT) nebulizer solution  0.5 mg Nebulization BID  . busPIRone  5 mg Oral BID  . Chlorhexidine Gluconate Cloth  6 each Topical Daily  . docusate sodium  200 mg Oral Daily  . feeding supplement (GLUCERNA SHAKE)  237 mL Oral BID BM  . fluticasone  1 spray Each Nare Daily  . gabapentin  300 mg Oral Daily  . guaiFENesin  600 mg Oral BID  . insulin aspart  0-15 Units Subcutaneous TID WC  . insulin aspart  0-5 Units  Subcutaneous QHS  . levothyroxine  25 mcg Oral QAC breakfast  . mouth rinse  15 mL Mouth Rinse BID  . meropenem (MERREM) IV  1 g Intravenous Q12H  . metoCLOPramide (REGLAN) injection  10 mg Intravenous Q6H  . pantoprazole  40 mg Oral Q1200  . potassium chloride  40 mEq Oral Daily  . sildenafil  20 mg Oral BID  . sodium chloride flush  10-40 mL Intracatheter Q12H  . umeclidinium bromide  1 puff Inhalation Daily  . Warfarin - Pharmacist Dosing Inpatient   Does not apply q1800    Infusions: . sodium chloride    . sodium chloride 20 mL/hr (12/16/16 1607)  . furosemide (LASIX) infusion 10 mg/hr (12/21/16 0700)  . lactated ringers 20 mL/hr at 12/13/16 1900  . lactated ringers 20 mL/hr at 12/21/16 0700  . milrinone 0.25 mcg/kg/min (12/21/16 0700)  . norepinephrine (LEVOPHED) Adult infusion Stopped (12/20/16 1510)    PRN Medications: Place/Maintain arterial line **AND** sodium chloride, acetaminophen, levalbuterol, ondansetron (ZOFRAN) IV, oxyCODONE, sodium chloride flush, traMADol, traZODone   Assessment/Plan/Discussion    1. Acute on chronic systolic CHF: Ischemic cardiomyopathy but with prominent RV dysfunction on echo and exam.  [employer] ICD.  Echo this admission showed EF 20-25% with moderately dilated LV and moderately dilated/moderately dysfunctional RV with severe TR.  He was admitted with low output biventricular failure, cardiorenal syndrome, and marked volume overload. Failed dual inotrope support. IABP placed 2/7. HM-II and TV repair performed 2/9 after discussion with [facility] transplant team. POD #3 HM-II and TV repair.  Extubated 2/11, off NO, vasopressin, and epinephrine. Speed decreased to 8800 rpm after ramp echo on 2/16 for RV support. Remains on milrinone 0.25 and norepinephrine 1.  Still struggling with volume overload, CVP 14-16 with co-ox 76% and some rise in BUN/creatinine.   - Given a degree of RV failure, suspect CVP will need to run ideally in the 10-12 range.  Would  continue current Lasix gtt today without increase given rise in BUN/creatinine.  - Continue milrinone 0.25 for RV support.  Suspect he can titrate off norepinephrine.  May need arterial line to facilitate MAP measurement.   - Sildenafil begun at 20 mg tid for RV failure.  Now off NO.  - INR therapeutic.  Continue warfarin, ASA 81 daily.   2. CAD: s/p CABG.  No signs/sx of ischemia.  3. AKI on CKD stage III: Suspect cardiorenal syndrome in setting of low output failure/RV failure.  Hopefully BUN/creatinine have stabilized.  He needs more diuresis but will have to do this slowly.  4. Atrial fibrillation: Chronic.  Rate is not elevated.  - Therapeutic on warfarin.   5. Post-op anemia: Also component of anemia of renal disease/chronic disease.  He got 1 unit PRBCs 2/12, appropriate increase in hgb.  Would transfuse hgb < 8.  6. Acute liver failure, post-op: Bili improving 5.0 -> 3.9 -> 2.7 -> 2.1 -> 1.8 -> 1.6. Likely hypoperfusion. Holding hepato-toxic agents.  7.  Aortic stenosis s/p TAVR: Stable on echo.  8. ID: Afebrile.  WBCs elevated stably.  Pseudomonas in respiratory culture, suspect tracheobronchitis.  Covering with meropenem. Acinetobacter also noted, discussed with ID and not treating at this time.  9. 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  10. Hyponatremia: Hypervolemic hyponatremia.  Fluid restrict (stable).  11. Pocket site pain: Better with gabapentin.   Loralie Champagne, MD  12/21/2016, 7:35 AM  Advanced Heart Failure Team Pager [phone] (M-F; 7a - 4p)  Please contact [facility] for night-coverage after hours (4p -7a ) and weekends on [email or web address]

## 2016-12-21 NOTE — Progress Notes (Signed)
Patient ID: Trevor [name], male   DOB: 1946-10-29, 71 y.o.   MRN: [id number] SICU Evening Rounds:  He has done well today. MAP 77  Diuresing well  Pump parameters stable.  Ambulated twice around the ICU tonight

## 2016-12-21 NOTE — Progress Notes (Signed)
ANTICOAGULATION CONSULT NOTE - Follow Up Consult  Pharmacy Consult for warfarin Indication: atrial fibrillation/LVAD  Allergies  Allergen Reactions  . No Known Allergies     Patient Measurements: Height: 5\' 3"  (160 cm) Weight: 200 lb 6.4 oz (90.9 kg) IBW/kg (Calculated) : 56.9  Vital Signs: Temp: 98.5 F (36.9 C) (02/17 0350) Temp Source: Oral (02/17 0350) BP: 84/70 (02/17 0700) Pulse Rate: 101 (02/17 0700)  Labs:  Recent Labs  12/18/16 2010 12/19/16 0000 12/19/16 0400 12/19/16 1400 12/19/16 1458 12/20/16 0411 12/21/16 0258  HGB  --   --  8.9*  --  8.5* 8.6* 8.3*  HCT  --   --  26.6*  --  25.0* 25.9* 24.9*  PLT  --   --  124*  --   --  149* 180  APTT 113* 56* 73*  --   --   --   --   LABPROT  --   --  27.3* 25.9*  --  28.8* 23.2*  INR  --   --  2.48 2.32  --  2.65 2.03  CREATININE  --   --  2.24*  --  2.30* 2.32* 2.38*    Estimated Creatinine Clearance: 28.4 mL/min (by C-G formula based on SCr of 2.38 mg/dL (H)).    Assessment: 70yom on apixaban pta for afib (last dose 1/31 @ 2110), transitioned to IV heparin for LVAD placement (2/9).   Patient now POD #7 on warfarin.  Bivalirudan off   INR 2.0 this am, trending down after lower dose given last night. Hgb stable at 8.3, pltc continues to improve at 180. LDH stable 289.  Low 4t score and low optical density noted HIT antibody resulting in weakly positive antibody test will go ahead and send SRA which continues to be in process (send out) for definitive purposes.    Goal of Therapy:  INR 2-2.5 Monitor platelets by anticoagulation protocol: Yes   Plan:  Warfarin 2.5 mg tonight Daily protime, CBC  Erin Hearing PharmD., BCPS Clinical Pharmacist Pager [phone] 12/21/2016 7:38 AM

## 2016-12-21 NOTE — Plan of Care (Signed)
Problem: Activity: Goal: Risk for activity intolerance will decrease Outcome: Progressing Patient ambulating with minimal assistance from staff. Patient able to disconnect himself from wall unit and into controller unit in preparation for walks with minimal cueing from staff. IS and deep breathing/cough encouraged.   Problem: Physical Regulation: Goal: Ability to maintain clinical measurements within normal limits will improve Outcome: Progressing Chest tubes have been removed. Ongoing assessment of neurological status continues. Patient appropriate and O x 4.

## 2016-12-22 LAB — GLUCOSE, CAPILLARY
GLUCOSE-CAPILLARY: 158 mg/dL — AB (ref 65–99)
GLUCOSE-CAPILLARY: 231 mg/dL — AB (ref 65–99)
Glucose-Capillary: 213 mg/dL — ABNORMAL HIGH (ref 65–99)
Glucose-Capillary: 175 mg/dL — ABNORMAL HIGH (ref 65–99)

## 2016-12-22 LAB — COMPREHENSIVE METABOLIC PANEL
ALT: 51 U/L (ref 17–63)
ANION GAP: 9 (ref 5–15)
AST: 60 U/L — ABNORMAL HIGH (ref 15–41)
Albumin: 2.5 g/dL — ABNORMAL LOW (ref 3.5–5.0)
Alkaline Phosphatase: 140 U/L — ABNORMAL HIGH (ref 38–126)
BUN: 66 mg/dL — ABNORMAL HIGH (ref 6–20)
CALCIUM: 8.6 mg/dL — AB (ref 8.9–10.3)
CHLORIDE: 88 mmol/L — AB (ref 101–111)
CO2: 31 mmol/L (ref 22–32)
Creatinine, Ser: 2.12 mg/dL — ABNORMAL HIGH (ref 0.61–1.24)
GFR, EST AFRICAN AMERICAN: 34 mL/min — AB (ref >60)
GFR, EST NON AFRICAN AMERICAN: 30 mL/min — AB (ref >60)
Glucose, Bld: 158 mg/dL — ABNORMAL HIGH (ref 65–99)
Potassium: 3.4 mmol/L — ABNORMAL LOW (ref 3.5–5.1)
Sodium: 128 mmol/L — ABNORMAL LOW (ref 135–145)
Total Bilirubin: 1.7 mg/dL — ABNORMAL HIGH (ref 0.3–1.2)
Total Protein: 5.7 g/dL — ABNORMAL LOW (ref 6.5–8.1)

## 2016-12-22 LAB — CBC
HCT: 24.8 % — ABNORMAL LOW (ref 39.0–52.0)
Hemoglobin: 8.3 g/dL — ABNORMAL LOW (ref 13.0–17.0)
MCH: 30.6 pg (ref 26.0–34.0)
MCHC: 33.5 g/dL (ref 30.0–36.0)
MCV: 91.5 fL (ref 78.0–100.0)
Platelets: 181 10*3/uL (ref 150–400)
RBC: 2.71 MIL/uL — ABNORMAL LOW (ref 4.22–5.81)
RDW: 17.2 % — ABNORMAL HIGH (ref 11.5–15.5)
WBC: 14.5 10*3/uL — ABNORMAL HIGH (ref 4.0–10.5)

## 2016-12-22 LAB — PROTIME-INR
INR: 1.77
Prothrombin Time: 20.8 seconds — ABNORMAL HIGH (ref 11.4–15.2)

## 2016-12-22 LAB — COOXEMETRY PANEL
Carboxyhemoglobin: 2 % — ABNORMAL HIGH (ref 0.5–1.5)
Methemoglobin: 1.1 % (ref 0.0–1.5)
O2 Saturation: 73.6 %
Total hemoglobin: 7.9 g/dL — ABNORMAL LOW (ref 12.0–16.0)

## 2016-12-22 LAB — LACTATE DEHYDROGENASE: LDH: 297 U/L — ABNORMAL HIGH (ref 98–192)

## 2016-12-22 LAB — MISCELLANEOUS TEST

## 2016-12-22 MED ORDER — POTASSIUM CHLORIDE CRYS ER 20 MEQ PO TBCR
40.0000 meq | EXTENDED_RELEASE_TABLET | Freq: Two times a day (BID) | ORAL | Status: DC
  Administered 2016-12-22 – 2016-12-28 (×13): 40 meq via ORAL
  Filled 2016-12-22 (×13): qty 2

## 2016-12-22 MED ORDER — WARFARIN SODIUM 3 MG PO TABS
4.0000 mg | ORAL_TABLET | Freq: Once | ORAL | Status: AC
  Administered 2016-12-22: 4 mg via ORAL
  Filled 2016-12-22: qty 0.5

## 2016-12-22 NOTE — Progress Notes (Signed)
Patient ID: Trevor [name], male   DOB: [birth year], 71 y.o.   MRN: [id number]    Advanced Heart Failure Rounding Note   Subjective:    Admitted with marked volume overload and low output HF.  Echo EF 20-25. Failed dual inotropes. IABP placed 2/7.   Underwent HM-II placement and TV repair on 2/9.   Off NO. Off epinephrine, remains on milrinone 0.25 and norepinephrine 2.  Chest tubes out.  Ramp echo on 2/16, speed decreased to 8800 to optimize RV.   MAP 70s.  I/Os negative on Lasix gtt.  CVP remains elevated ranging 13-15 (downward trend).  UOP better yesterday.       1 unit PRBCs 2/12.   Respiratory cultures with Pseudomonas, meropenem started.  Acinetobacter also isolated, discussed with ID and recommended not treating this since extubated.  Afebrile.   LVAD parameters:   Speed 8800  Flow 6 PI 4.7 Power 5.4.  5 PI events/24 hrs.   Objective:   Weight Range:  Vital Signs:   Temp:  [97.2 F (36.2 C)-98.8 F (37.1 C)] 98.5 F (36.9 C) (02/18 0500) Pulse Rate:  [32-131] 69 (02/18 0700) Resp:  [9-21] 11 (02/18 0700) BP: (74-169)/(56-120) 87/68 (02/18 0600) SpO2:  [94 %-100 %] 97 % (02/18 0700) Arterial Line BP: (68-100)/(58-73) 77/64 (02/18 0700) Weight:  [201 lb 3.2 oz (91.3 kg)] 201 lb 3.2 oz (91.3 kg) (02/17 0745) Last BM Date: 12/20/16  Weight change: Filed Weights   12/19/16 0500 12/20/16 0500 12/21/16 0745  Weight: 199 lb 4.7 oz (90.4 kg) 200 lb 6.4 oz (90.9 kg) 201 lb 3.2 oz (91.3 kg)    Intake/Output:   Intake/Output Summary (Last 24 hours) at 12/22/16 0734 Last data filed at 12/22/16 0700  Gross per 24 hour  Intake          1897.05 ml  Output             4105 ml  Net         -2207.95 ml     Physical Exam: General:  NAD  HEENT: normal   Neck: supple, JVP 12-14 cm.  Cor: Sternal dressing in place. +CTs. LVAD hum  Lungs: CTAB  Abdomen: obese soft, NT. Normal BS. Driveline site ok Extremities: no cyanosis, clubbing, rash.  1+ edema lower leg L>R.    Neuro: Alert/oriented.  GU: Foley   Telemetry: Reviewed,  Atrial fibrillation, rate 80s   Labs: Basic Metabolic Panel:  Recent Labs Lab 12/16/16 0330  12/17/16 0400  12/18/16 0412  12/19/16 0400 12/19/16 1458 12/20/16 0411 12/21/16 0258 12/22/16 0401  NA 131*  < >  --   < > 128*  < > 131* 130* 130* 132* 128*  K 3.6  < >  --   < > 4.0  < > 3.7 3.8 3.9 3.6 3.4*  CL 94*  < >  --   < > 89*  < > 92* 88* 91* 93* 88*  CO2 26  --   --   < > 27  --  29  --  28 30 31   GLUCOSE 85  < >  --   < > 122*  < > 148* 168* 174* 162* 158*  BUN 38*  < >  --   < > 47*  < > 54* 62* 59* 67* 66*  CREATININE 1.85*  < >  --   < > 2.14*  < > 2.24* 2.30* 2.32* 2.38* 2.12*  CALCIUM 8.2*  --   --   < >  8.6*  --  8.5*  --  8.8* 8.7* 8.6*  MG 2.1  --  2.2  --  1.9  --  2.0  --  2.0  --   --   PHOS 4.3  --  4.4  --   --   --   --   --   --   --   --   < > = values in this interval not displayed.  Liver Function Tests:  Recent Labs Lab 12/18/16 0412 12/19/16 0400 12/20/16 0411 12/21/16 0258 12/22/16 0401  AST 47* 50* 49* 55* 60*  ALT 35 44 46 48 51  ALKPHOS 88 102 129* 133* 140*  BILITOT 1.8* 1.8* 1.8* 1.6* 1.7*  PROT 5.6* 5.7* 6.0* 5.8* 5.7*  ALBUMIN 2.9* 2.9* 2.8* 2.7* 2.5*   No results for input(s): LIPASE, AMYLASE in the last 168 hours. No results for input(s): AMMONIA in the last 168 hours.  CBC:  Recent Labs Lab 12/16/16 0330  12/17/16 0400  12/18/16 0412  12/18/16 1715 12/19/16 0400 12/19/16 1458 12/20/16 0411 12/21/16 0258 12/22/16 0401  WBC 16.1*  < > 15.4*  --  11.6*  --  10.9* 13.2*  --  16.6* 16.0* 14.5*  NEUTROABS 12.9*  --  11.7*  --  8.4*  --   --  9.9*  --  12.9*  --   --   HGB 8.1*  < > 8.8*  < > 8.6*  < > 8.7* 8.9* 8.5* 8.6* 8.3* 8.3*  HCT 23.5*  < > 26.3*  < > 25.8*  < > 25.7* 26.6* 25.0* 25.9* 24.9* 24.8*  MCV 88.3  < > 88.9  --  89.9  --  90.2 90.8  --  90.9 91.2 91.5  PLT 79*  < > 77*  --  92*  --  105* 124*  --  149* 180 181  < > = values in this interval not  displayed.  Cardiac Enzymes: No results for input(s): CKTOTAL, CKMB, CKMBINDEX, TROPONINI in the last 168 hours.  BNP: BNP (last 3 results)  Recent Labs  11/28/16 1104 12/14/16 0348  BNP 1,825.3* 427.7*    ProBNP (last 3 results)  Recent Labs  11/27/16 1207  PROBNP 1,746.0*      Other results:  Imaging: Dg Chest Port 1 View  Result Date: 12/21/2016 CLINICAL DATA:  LVAD EXAM: PORTABLE CHEST 1 VIEW COMPARISON:  12/20/2016 FINDINGS: Minimally seen LVAD. There is a transcatheter aortic valve replacement. ICD/pacer lead from the right without detected change, tip not visualized. Left upper extremity PICC and subclavian line are in unchanged position. Trace left apical pneumothorax, in retrospect stable from yesterday. Low volume chest with basilar haziness greater on the right, likely atelectasis. [name] nurse is currently busy with a patient, RN Alyse Low will relay the results to her when she is free. IMPRESSION: 1. Trace left apical pneumothorax, in retrospect stable. No visible recurrence of the right pneumothorax. 2. Low volume chest with atelectasis. Electronically Signed   By: Monte Fantasia M.D.   On: 12/21/2016 07:39     Medications:     Scheduled Medications: . amiodarone  200 mg Oral Daily  . arformoterol  15 mcg Nebulization BID  . aspirin  81 mg Oral Daily  . bisacodyl  10 mg Oral Daily   Or  . bisacodyl  10 mg Rectal Daily  . budesonide (PULMICORT) nebulizer solution  0.5 mg Nebulization BID  . busPIRone  5 mg Oral BID  . Chlorhexidine Gluconate Cloth  6 each Topical Daily  . docusate sodium  200 mg Oral Daily  . feeding supplement (GLUCERNA SHAKE)  237 mL Oral BID BM  . fluticasone  1 spray Each Nare Daily  . gabapentin  300 mg Oral Daily  . guaiFENesin  600 mg Oral BID  . insulin aspart  0-15 Units Subcutaneous TID WC  . insulin aspart  0-5 Units Subcutaneous QHS  . levothyroxine  25 mcg Oral QAC breakfast  . mouth rinse  15 mL Mouth Rinse BID    . meropenem (MERREM) IV  1 g Intravenous Q12H  . metoCLOPramide (REGLAN) injection  10 mg Intravenous Q6H  . pantoprazole  40 mg Oral Q1200  . potassium chloride  40 mEq Oral Daily  . sildenafil  20 mg Oral BID  . sodium chloride flush  10-40 mL Intracatheter Q12H  . umeclidinium bromide  1 puff Inhalation Daily  . warfarin  4 mg Oral Once  . Warfarin - Pharmacist Dosing Inpatient   Does not apply q1800    Infusions: . sodium chloride    . sodium chloride 20 mL/hr at 12/22/16 0700  . furosemide (LASIX) infusion 10 mg/hr (12/22/16 0700)  . lactated ringers 20 mL/hr at 12/13/16 1900  . lactated ringers Stopped (12/21/16 1901)  . milrinone 0.25 mcg/kg/min (12/22/16 0700)  . norepinephrine (LEVOPHED) Adult infusion 2 mcg/min (12/22/16 0700)    PRN Medications: Place/Maintain arterial line **AND** sodium chloride, acetaminophen, levalbuterol, ondansetron (ZOFRAN) IV, oxyCODONE, sodium chloride flush, traMADol, traZODone   Assessment/Plan/Discussion    1. Acute on chronic systolic CHF: Ischemic cardiomyopathy but with prominent RV dysfunction on echo and exam.  [employer] ICD.  Echo this admission showed EF 20-25% with moderately dilated LV and moderately dilated/moderately dysfunctional RV with severe TR.  He was admitted with low output biventricular failure, cardiorenal syndrome, and marked volume overload. Failed dual inotrope support. IABP placed 2/7. HM-II and TV repair performed 2/9 after discussion with [facility] transplant team. POD #3 HM-II and TV repair.  Extubated 2/11, off NO, vasopressin, and epinephrine. Speed decreased to 8800 rpm after ramp echo on 2/16 for RV support. Remains on milrinone 0.25 and norepinephrine 2.  Co-ox 74%, CVP 13-15 with good UOP yesterday. - Continue Lasix gtt at 10 mg/hr today.   - Continue milrinone 0.25 for RV support.  Titrate down norepinephrine.   - Sildenafil begun at 20 mg tid for RV failure.  Now off NO.  - Continue warfarin, ASA 81 daily.  Will  need warfarin adjustment.  2. CAD: s/p CABG.  No signs/sx of ischemia.  3. AKI on CKD stage III: Suspect cardiorenal syndrome in setting of low output failure/RV failure.  Creatinine down today.   4. Atrial fibrillation: Chronic.  Rate is not elevated.  - Think we can stop amiodarone.   5. Post-op anemia: Also component of anemia of renal disease/chronic disease.  He got 1 unit PRBCs 2/12, appropriate increase in hgb.  Would transfuse hgb < 8.  6. Acute liver failure, post-op: Bilirubin improved. Likely hypoperfusion. Holding hepato-toxic agents.  7. Aortic stenosis s/p TAVR: Stable on echo.  8. ID: Afebrile.  WBCs elevated stably.  Pseudomonas in respiratory culture, suspect tracheobronchitis.  Covering with meropenem. Acinetobacter also noted, discussed with ID and not treating at this time.  9. 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  10. Hyponatremia: Hypervolemic hyponatremia.  Fluid restrict.  11. Pocket site pain: Better with gabapentin.   Loralie Champagne, MD  12/22/2016, 7:34 AM  Advanced Heart  Failure Team Pager [phone] (M-F; 7a - 4p)  Please contact [facility] for night-coverage after hours (4p -7a ) and weekends on [email or web address]

## 2016-12-22 NOTE — Progress Notes (Signed)
Patient ID: Trevor [name], male   DOB: 24-Apr-1946, 71 y.o.   MRN: [id number]  SICU Evening Rounds:   Hemodynamically stable, levophed is off and mean is 70 when awake but reportedly drops into 60's when asleep. LVAD parameters stable  Urine output good, -1100 cc today   A/P:  Stable day. Continue current plans

## 2016-12-22 NOTE — Progress Notes (Signed)
ANTICOAGULATION CONSULT NOTE - Follow Up Consult  Pharmacy Consult for warfarin Indication: atrial fibrillation/LVAD  Allergies  Allergen Reactions  . No Known Allergies     Patient Measurements: Height: 5\' 3"  (160 cm) Weight: 201 lb 3.2 oz (91.3 kg) IBW/kg (Calculated) : 56.9  Vital Signs: Temp: 98.5 F (36.9 C) (02/18 0500) Temp Source: Oral (02/18 0500) BP: 87/68 (02/18 0600) Pulse Rate: 131 (02/18 0600)  Labs:  Recent Labs  12/20/16 0411 12/21/16 0258 12/22/16 0401  HGB 8.6* 8.3* 8.3*  HCT 25.9* 24.9* 24.8*  PLT 149* 180 181  LABPROT 28.8* 23.2* 20.8*  INR 2.65 2.03 1.77  CREATININE 2.32* 2.38* 2.12*    Estimated Creatinine Clearance: 32 mL/min (by C-G formula based on SCr of 2.12 mg/dL (H)).    Assessment: 70yom on apixaban pta for afib (last dose 1/31 @ 2110), transitioned to IV heparin for LVAD placement (2/9).   Patient now POD #8 on warfarin.  Bivalirudan off   INR 1.77 this am, after reduced dose given on 2/16. Hgb stable at 8.3, pltc continues to improve at 181. LDH stable. Will give extra warfarin today to try and correct trend. SRA still pending  Goal of Therapy:  INR 2-2.5 Monitor platelets by anticoagulation protocol: Yes   Plan:  Warfarin 4 mg this morning Daily protime, CBC  Erin Hearing PharmD., BCPS Clinical Pharmacist Pager [phone] 12/22/2016 7:18 AM

## 2016-12-22 NOTE — Progress Notes (Signed)
Patient ID: Trevor [name], male   DOB: 1946-01-19, 71 y.o.   MRN: [id number]  HeartMate 2 Rounding Note  Subjective:    He feels better today. Eating well. Ambulating. Still has rattling cough but bringing up some "globs of stuff".  Remains on milrinone 0.375 and levophed 2.  Co-ox 74%  LVAD INTERROGATION:  HeartMate II LVAD:  Flow 6 liters/min, speed 8800, power 5.4, PI 5.  Several PI events  Objective:    Vital Signs:   Temp:  [97.2 F (36.2 C)-98.5 F (36.9 C)] 98.5 F (36.9 C) (02/18 0500) Pulse Rate:  [36-131] 58 (02/18 0900) Resp:  [9-20] 13 (02/18 0900) BP: (75-131)/(56-108) 81/62 (02/18 0900) SpO2:  [93 %-100 %] 93 % (02/18 1020) Arterial Line BP: (74-100)/(60-73) 82/65 (02/18 0900) Weight:  [90.8 kg (200 lb 1.6 oz)] 90.8 kg (200 lb 1.6 oz) (02/18 0700) Last BM Date: 12/20/16 Mean arterial Pressure 79 on levophed 2 mcg. Dropped into the 60's off levophed.  Intake/Output:   Intake/Output Summary (Last 24 hours) at 12/22/16 1115 Last data filed at 12/22/16 1034  Gross per 24 hour  Intake          1649.68 ml  Output             3505 ml  Net         -1855.32 ml     Physical Exam: General:  Looks stronger today.  No resp difficulty HEENT: normal Cor: Normal heart sounds with LVAD hum present. Lungs: diminished in lower lobes. No rhonchi. Rattling cough though. Abdomen: soft, nontender, nondistended.  Good bowel sounds. Extremities: moderate edema in legs is improving. Neuro: alert & orientedx3, cranial nerves grossly intact. moves all 4 extremities w/o difficulty. Affect pleasant  Telemetry: atrial fib 80's  Labs: Basic Metabolic Panel:  Recent Labs Lab 12/16/16 0330  12/17/16 0400  12/18/16 0412  12/19/16 0400 12/19/16 1458 12/20/16 0411 12/21/16 0258 12/22/16 0401  NA 131*  < >  --   < > 128*  < > 131* 130* 130* 132* 128*  K 3.6  < >  --   < > 4.0  < > 3.7 3.8 3.9 3.6 3.4*  CL 94*  < >  --   < > 89*  < > 92* 88* 91* 93* 88*  CO2 26  --   --    < > 27  --  29  --  28 30 31   GLUCOSE 85  < >  --   < > 122*  < > 148* 168* 174* 162* 158*  BUN 38*  < >  --   < > 47*  < > 54* 62* 59* 67* 66*  CREATININE 1.85*  < >  --   < > 2.14*  < > 2.24* 2.30* 2.32* 2.38* 2.12*  CALCIUM 8.2*  --   --   < > 8.6*  --  8.5*  --  8.8* 8.7* 8.6*  MG 2.1  --  2.2  --  1.9  --  2.0  --  2.0  --   --   PHOS 4.3  --  4.4  --   --   --   --   --   --   --   --   < > = values in this interval not displayed.  Liver Function Tests:  Recent Labs Lab 12/18/16 0412 12/19/16 0400 12/20/16 0411 12/21/16 0258 12/22/16 0401  AST 47* 50* 49* 55* 60*  ALT 35 44 46  48 51  ALKPHOS 88 102 129* 133* 140*  BILITOT 1.8* 1.8* 1.8* 1.6* 1.7*  PROT 5.6* 5.7* 6.0* 5.8* 5.7*  ALBUMIN 2.9* 2.9* 2.8* 2.7* 2.5*   No results for input(s): LIPASE, AMYLASE in the last 168 hours. No results for input(s): AMMONIA in the last 168 hours.  CBC:  Recent Labs Lab 12/16/16 0330  12/17/16 0400  12/18/16 0412  12/18/16 1715 12/19/16 0400 12/19/16 1458 12/20/16 0411 12/21/16 0258 12/22/16 0401  WBC 16.1*  < > 15.4*  --  11.6*  --  10.9* 13.2*  --  16.6* 16.0* 14.5*  NEUTROABS 12.9*  --  11.7*  --  8.4*  --   --  9.9*  --  12.9*  --   --   HGB 8.1*  < > 8.8*  < > 8.6*  < > 8.7* 8.9* 8.5* 8.6* 8.3* 8.3*  HCT 23.5*  < > 26.3*  < > 25.8*  < > 25.7* 26.6* 25.0* 25.9* 24.9* 24.8*  MCV 88.3  < > 88.9  --  89.9  --  90.2 90.8  --  90.9 91.2 91.5  PLT 79*  < > 77*  --  92*  --  105* 124*  --  149* 180 181  < > = values in this interval not displayed.  INR:  Recent Labs Lab 12/19/16 0400 12/19/16 1400 12/20/16 0411 12/21/16 0258 12/22/16 0401  INR 2.48 2.32 2.65 2.03 1.77    Other results:  EKG:   Imaging: Dg Chest Port 1 View  Result Date: 12/21/2016 CLINICAL DATA:  LVAD EXAM: PORTABLE CHEST 1 VIEW COMPARISON:  12/20/2016 FINDINGS: Minimally seen LVAD. There is a transcatheter aortic valve replacement. ICD/pacer lead from the right without detected change, tip not  visualized. Left upper extremity PICC and subclavian line are in unchanged position. Trace left apical pneumothorax, in retrospect stable from yesterday. Low volume chest with basilar haziness greater on the right, likely atelectasis. [name] nurse is currently busy with a patient, RN Alyse Low will relay the results to her when she is free. IMPRESSION: 1. Trace left apical pneumothorax, in retrospect stable. No visible recurrence of the right pneumothorax. 2. Low volume chest with atelectasis. Electronically Signed   By: Monte Fantasia M.D.   On: 12/21/2016 07:39      Medications:     Scheduled Medications: . arformoterol  15 mcg Nebulization BID  . aspirin  81 mg Oral Daily  . bisacodyl  10 mg Oral Daily   Or  . bisacodyl  10 mg Rectal Daily  . budesonide (PULMICORT) nebulizer solution  0.5 mg Nebulization BID  . busPIRone  5 mg Oral BID  . Chlorhexidine Gluconate Cloth  6 each Topical Daily  . docusate sodium  200 mg Oral Daily  . feeding supplement (GLUCERNA SHAKE)  237 mL Oral BID BM  . fluticasone  1 spray Each Nare Daily  . gabapentin  300 mg Oral Daily  . guaiFENesin  600 mg Oral BID  . insulin aspart  0-15 Units Subcutaneous TID WC  . insulin aspart  0-5 Units Subcutaneous QHS  . levothyroxine  25 mcg Oral QAC breakfast  . mouth rinse  15 mL Mouth Rinse BID  . meropenem (MERREM) IV  1 g Intravenous Q12H  . metoCLOPramide (REGLAN) injection  10 mg Intravenous Q6H  . pantoprazole  40 mg Oral Q1200  . potassium chloride  40 mEq Oral BID  . sildenafil  20 mg Oral BID  . sodium chloride flush  10-40 mL Intracatheter Q12H  . umeclidinium bromide  1 puff Inhalation Daily  . Warfarin - Pharmacist Dosing Inpatient   Does not apply q1800     Infusions: . sodium chloride    . sodium chloride 20 mL/hr at 12/22/16 1000  . furosemide (LASIX) infusion 10 mg/hr (12/22/16 1000)  . lactated ringers 20 mL/hr at 12/13/16 1900  . lactated ringers Stopped (12/21/16 1901)  .  milrinone 0.25 mcg/kg/min (12/22/16 1000)  . norepinephrine (LEVOPHED) Adult infusion 2 mcg/min (12/22/16 1000)     PRN Medications:  Place/Maintain arterial line **AND** sodium chloride, acetaminophen, levalbuterol, ondansetron (ZOFRAN) IV, oxyCODONE, sodium chloride flush, traMADol, traZODone   Assessment:   1. Acute on chronic systolic heart failure due to ischemic cardiomyopathy with RV dysfunction and severe TR. POD 9 s/p Heart Mate 2 with TV repair. He is hemodynamically stable on Milrinone 0.25 and Levophed 2 mcg. Co-ox 74% this am. CVP 13 and weight is 20 lbs over preop but down 1 lb from yesterday.  2. Acute on chronic kidney disease, stage III. He is diuresing on lasix drip and -2200 cc yesterday with creat down slightly to 2.12  3. Acute hepatic failure due to preop low cardiac output. This is much improved.   4. Probable tracheobronchitis with rattling cough and Pseudomonas in sputum. CXR shows bilateral LL atelectasis. Continue Meropenem. Remains afebrile and WBC down to 14.5K.  5. 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 SRA pending. He is therapeutic on coumadin so no other treatment indicated even if this was HIT. Plts continue to rise.  6. AS s/p TAVR.   7. Rate controlled atrial fib. amio stopped   8. On coumadin for atrial fib and LVAD. INR dropped to 1.77. Pharmacy to adjust dose.  Plan/Discussion:      I reviewed the LVAD parameters from today, and compared the results to the patient's prior recorded data.  No programming changes were made.  The LVAD is functioning within specified parameters.  The patient performs LVAD self-test daily.  LVAD equipment check completed and is in good working order.  Back-up equipment present.   LVAD education done on emergency procedures and precautions and reviewed exit site care.  Length of Stay: [place], MD 12/22/2016, 11:15 AM

## 2016-12-23 ENCOUNTER — Inpatient Hospital Stay (HOSPITAL_COMMUNITY): Payer: Medicare Other

## 2016-12-23 LAB — COMPREHENSIVE METABOLIC PANEL
ALT: 45 U/L (ref 17–63)
ANION GAP: 8 (ref 5–15)
AST: 50 U/L — ABNORMAL HIGH (ref 15–41)
Albumin: 2.5 g/dL — ABNORMAL LOW (ref 3.5–5.0)
Alkaline Phosphatase: 135 U/L — ABNORMAL HIGH (ref 38–126)
BUN: 65 mg/dL — ABNORMAL HIGH (ref 6–20)
CO2: 34 mmol/L — ABNORMAL HIGH (ref 22–32)
Calcium: 8.5 mg/dL — ABNORMAL LOW (ref 8.9–10.3)
Chloride: 87 mmol/L — ABNORMAL LOW (ref 101–111)
Creatinine, Ser: 1.85 mg/dL — ABNORMAL HIGH (ref 0.61–1.24)
GFR, EST AFRICAN AMERICAN: 41 mL/min — AB (ref >60)
GFR, EST NON AFRICAN AMERICAN: 35 mL/min — AB (ref >60)
Glucose, Bld: 169 mg/dL — ABNORMAL HIGH (ref 65–99)
POTASSIUM: 3.5 mmol/L (ref 3.5–5.1)
Sodium: 129 mmol/L — ABNORMAL LOW (ref 135–145)
Total Bilirubin: 1.5 mg/dL — ABNORMAL HIGH (ref 0.3–1.2)
Total Protein: 5.7 g/dL — ABNORMAL LOW (ref 6.5–8.1)

## 2016-12-23 LAB — GLUCOSE, CAPILLARY
GLUCOSE-CAPILLARY: 168 mg/dL — AB (ref 65–99)
GLUCOSE-CAPILLARY: 224 mg/dL — AB (ref 65–99)
Glucose-Capillary: 208 mg/dL — ABNORMAL HIGH (ref 65–99)
Glucose-Capillary: 189 mg/dL — ABNORMAL HIGH (ref 65–99)

## 2016-12-23 LAB — CBC
HCT: 25.1 % — ABNORMAL LOW (ref 39.0–52.0)
Hemoglobin: 8.3 g/dL — ABNORMAL LOW (ref 13.0–17.0)
MCH: 30.1 pg (ref 26.0–34.0)
MCHC: 33.1 g/dL (ref 30.0–36.0)
MCV: 90.9 fL (ref 78.0–100.0)
Platelets: 209 10*3/uL (ref 150–400)
RBC: 2.76 MIL/uL — ABNORMAL LOW (ref 4.22–5.81)
RDW: 17.1 % — ABNORMAL HIGH (ref 11.5–15.5)
WBC: 13.4 10*3/uL — ABNORMAL HIGH (ref 4.0–10.5)

## 2016-12-23 LAB — SEROTONIN RELEASE ASSAY (SRA)
SRA .2 IU/mL UFH Ser-aCnc: 1 % (ref 0–20)
SRA 100IU/mL UFH Ser-aCnc: <1 % (ref 0–20)

## 2016-12-23 LAB — COOXEMETRY PANEL
Carboxyhemoglobin: 1.8 % — ABNORMAL HIGH (ref 0.5–1.5)
Methemoglobin: 1.2 % (ref 0.0–1.5)
O2 Saturation: 71.4 %
Total hemoglobin: 8.4 g/dL — ABNORMAL LOW (ref 12.0–16.0)

## 2016-12-23 LAB — LACTATE DEHYDROGENASE: LDH: 309 U/L — ABNORMAL HIGH (ref 98–192)

## 2016-12-23 LAB — PROTIME-INR
INR: 1.59
Prothrombin Time: 19.1 seconds — ABNORMAL HIGH (ref 11.4–15.2)

## 2016-12-23 MED ORDER — WARFARIN SODIUM 4 MG PO TABS
4.0000 mg | ORAL_TABLET | Freq: Once | ORAL | Status: AC
  Administered 2016-12-23: 4 mg via ORAL
  Filled 2016-12-23: qty 1

## 2016-12-23 MED ORDER — SILDENAFIL CITRATE 20 MG PO TABS
20.0000 mg | ORAL_TABLET | Freq: Three times a day (TID) | ORAL | Status: DC
  Administered 2016-12-23 – 2017-01-01 (×28): 20 mg via ORAL
  Filled 2016-12-23 (×30): qty 1

## 2016-12-23 MED ORDER — FUROSEMIDE 10 MG/ML IJ SOLN
80.0000 mg | Freq: Two times a day (BID) | INTRAMUSCULAR | Status: DC
  Administered 2016-12-23 – 2016-12-26 (×7): 80 mg via INTRAVENOUS
  Filled 2016-12-23 (×8): qty 8

## 2016-12-23 MED ORDER — SORBITOL 70 % SOLN
45.0000 mL | Freq: Once | Status: AC
  Administered 2016-12-23: 45 mL via ORAL
  Filled 2016-12-23: qty 60

## 2016-12-23 NOTE — Progress Notes (Signed)
Physical Therapy Treatment Patient Details Name: Trevor [name] MRN: [id number] DOB: 22-Nov-1945 Today's Date: 12/23/2016    History of Present Illness  71 y.o.male with ischemic cardiomyopathy, severe tricuspid regurgitation, ejection fraction 20% and chronic renal insufficiency, CHF, CKD, DM2, asthma. The patient was in cardiogenic shock with severe volume overload and deterioration in renal function on admission s/p LVAD Heartmate II implant 2/9     PT Comments    Pt progressing toward goals by ambulating further with decreased assist in mobility. Pt able to transfer VAD from wall power to battery with min v/c and was able to direct PT on how to transfer back to wall power. Pt also able to state that he needed a back up battery. Current d/c plans continue to be appropriate. Will continue to follow.   Follow Up Recommendations  Home health PT;Supervision for mobility/OOB     Equipment Recommendations  Rolling walker with 5" wheels    Recommendations for Other Services       Precautions / Restrictions Precautions Precautions: Sternal;Fall Precaution Comments: reviewed sternal precautions, LVAD Restrictions Weight Bearing Restrictions: Yes Other Position/Activity Restrictions: sternal precautions; no push/pull bilateral UE    Mobility  Bed Mobility               General bed mobility comments: pt in chair upon arrival  Transfers Overall transfer level: Needs assistance Equipment used: Rolling walker (2 wheeled) Transfers: Sit to/from Stand Sit to Stand: Min guard         General transfer comment: cues for hand placement; increased time; able to adhere to sternal precautions  Ambulation/Gait Ambulation/Gait assistance: Min assist Ambulation Distance (Feet): 400 Feet Assistive device: Rolling walker (2 wheeled) Gait Pattern/deviations: Step-through pattern;Decreased step length - right;Decreased step length - left;Decreased stride length;Trunk flexed;Wide  base of support Gait velocity: decreased Gait velocity interpretation: Below normal speed for age/gender General Gait Details: slow speed with appropriate use of RW; several standing rest breaks of short duration <10 seconds; complaints of bilateral knee stiffness;  complaints of feeling woozy but denies dizziness and art line showing MAP of 74   Stairs            Wheelchair Mobility    Modified Rankin (Stroke Patients Only)       Balance Overall balance assessment: Needs assistance Sitting-balance support: Feet supported;No upper extremity supported Sitting balance-[name]: Good     Standing balance support: No upper extremity supported Standing balance-[name]: Good Standing balance comment: not reliant upon RW during static standing rest breaks                    Cognition Arousal/Alertness: Awake/alert Behavior During Therapy: WFL for tasks assessed/performed Overall Cognitive Status: Within Functional Limits for tasks assessed                 General Comments: able to recall all sternal precautions; able to recall LVAD equipment needed to ambulate    Exercises      General Comments General comments (skin integrity, edema, etc.): drive line intact      Pertinent Vitals/Pain Pain Assessment: No/denies pain Pain Score: 0-No pain    Home Living                      Prior Function            PT Goals (current goals can now be found in the care plan section) Progress towards PT goals: Progressing toward goals  Frequency    Min 3X/week      PT Plan Current plan remains appropriate    Co-evaluation             End of Session   Activity Tolerance: Patient tolerated treatment well Patient left: in chair;with call bell/phone within reach;with nursing/sitter in room     Time: 0730-0814 PT Time Calculation (min) (ACUTE ONLY): 44 min  Charges:  $Gait Training: 23-37 mins $Therapeutic Activity: 8-22 mins                     G CodesTracie Harrier 12/30/2016, 9:37 AM   Tracie Harrier, SPT Acute Rehab SPT [phone]

## 2016-12-23 NOTE — Progress Notes (Signed)
Patient ID: Trevor [name], male   DOB: 11-23-1945, 71 y.o.   MRN: [id number]    Advanced Heart Failure Rounding Note   Subjective:    Admitted with marked volume overload and low output HF.  Echo EF 20-25. Failed dual inotropes. IABP placed 2/7.   Underwent HM-II placement and TV repair on 2/9.   Off NO. Off epinephrine, remains on milrinone 0.25 and norepinephrine 2.  Chest tubes out.  Ramp echo on 2/16, speed decreased to 8800 to optimize RV.   MAP 70s.  Off norepinephrine during day but then BP drops while asleep and it is restarted.  I/Os negative on Lasix gtt.  CVP remains elevated ranging 10-14 (downward trend).  Good UOP.        Doing well with his walks.   1 unit PRBCs 2/12.   Respiratory cultures with Pseudomonas, meropenem started.  Acinetobacter also isolated, discussed with ID and recommended not treating this since extubated.  Afebrile.   LVAD parameters:   Speed 8800  Flow 5.8 PI 5.6 Power 5.4.  5 PI events/24 hrs.   Objective:   Weight Range:  Vital Signs:   Temp:  [98.2 F (36.8 C)-99.2 F (37.3 C)] 99.2 F (37.3 C) (02/19 0400) Pulse Rate:  [35-93] 69 (02/19 0600) Resp:  [12-20] 13 (02/19 0600) BP: (81-114)/(54-99) 114/99 (02/19 0700) SpO2:  [93 %-100 %] 99 % (02/19 0600) Arterial Line BP: (73-106)/(54-76) 94/71 (02/19 0600) Weight:  [196 lb 6.9 oz (89.1 kg)] 196 lb 6.9 oz (89.1 kg) (02/19 0600) Last BM Date: 12/20/16  Weight change: Filed Weights   12/21/16 0745 12/22/16 0700 12/23/16 0600  Weight: 201 lb 3.2 oz (91.3 kg) 200 lb 1.6 oz (90.8 kg) 196 lb 6.9 oz (89.1 kg)    Intake/Output:   Intake/Output Summary (Last 24 hours) at 12/23/16 0723 Last data filed at 12/23/16 0700  Gross per 24 hour  Intake          2198.21 ml  Output             4690 ml  Net         -2491.79 ml     Physical Exam: General:  NAD  HEENT: normal   Neck: supple, JVP 10-12 cm.  Cor: Sternal dressing in place. +CTs. LVAD hum  Lungs: CTAB  Abdomen: obese soft,  NT. Normal BS. Driveline site ok Extremities: no cyanosis, clubbing, rash.  1+ edema lower leg L>R.  Neuro: Alert/oriented.  GU: Foley   Telemetry: Reviewed,  Atrial fibrillation, rate 80s   Labs: Basic Metabolic Panel:  Recent Labs Lab 12/17/16 0400  12/18/16 0412  12/19/16 0400 12/19/16 1458 12/20/16 0411 12/21/16 0258 12/22/16 0401 12/23/16 0505  NA  --   < > 128*  < > 131* 130* 130* 132* 128* 129*  K  --   < > 4.0  < > 3.7 3.8 3.9 3.6 3.4* 3.5  CL  --   < > 89*  < > 92* 88* 91* 93* 88* 87*  CO2  --   < > 27  --  29  --  28 30 31  34*  GLUCOSE  --   < > 122*  < > 148* 168* 174* 162* 158* 169*  BUN  --   < > 47*  < > 54* 62* 59* 67* 66* 65*  CREATININE  --   < > 2.14*  < > 2.24* 2.30* 2.32* 2.38* 2.12* 1.85*  CALCIUM  --   < > 8.6*  --  8.5*  --  8.8* 8.7* 8.6* 8.5*  MG 2.2  --  1.9  --  2.0  --  2.0  --   --   --   PHOS 4.4  --   --   --   --   --   --   --   --   --   < > = values in this interval not displayed.  Liver Function Tests:  Recent Labs Lab 12/19/16 0400 12/20/16 0411 12/21/16 0258 12/22/16 0401 12/23/16 0505  AST 50* 49* 55* 60* 50*  ALT 44 46 48 51 45  ALKPHOS 102 129* 133* 140* 135*  BILITOT 1.8* 1.8* 1.6* 1.7* 1.5*  PROT 5.7* 6.0* 5.8* 5.7* 5.7*  ALBUMIN 2.9* 2.8* 2.7* 2.5* 2.5*   No results for input(s): LIPASE, AMYLASE in the last 168 hours. No results for input(s): AMMONIA in the last 168 hours.  CBC:  Recent Labs Lab 12/17/16 0400  12/18/16 0412  12/19/16 0400 12/19/16 1458 12/20/16 0411 12/21/16 0258 12/22/16 0401 12/23/16 0505  WBC 15.4*  --  11.6*  < > 13.2*  --  16.6* 16.0* 14.5* 13.4*  NEUTROABS 11.7*  --  8.4*  --  9.9*  --  12.9*  --   --   --   HGB 8.8*  < > 8.6*  < > 8.9* 8.5* 8.6* 8.3* 8.3* 8.3*  HCT 26.3*  < > 25.8*  < > 26.6* 25.0* 25.9* 24.9* 24.8* 25.1*  MCV 88.9  --  89.9  < > 90.8  --  90.9 91.2 91.5 90.9  PLT 77*  --  92*  < > 124*  --  149* 180 181 209  < > = values in this interval not displayed.  Cardiac  Enzymes: No results for input(s): CKTOTAL, CKMB, CKMBINDEX, TROPONINI in the last 168 hours.  BNP: BNP (last 3 results)  Recent Labs  11/28/16 1104 12/14/16 0348  BNP 1,825.3* 427.7*    ProBNP (last 3 results)  Recent Labs  11/27/16 1207  PROBNP 1,746.0*      Other results:  Imaging: No results found.   Medications:     Scheduled Medications: . arformoterol  15 mcg Nebulization BID  . aspirin  81 mg Oral Daily  . bisacodyl  10 mg Oral Daily   Or  . bisacodyl  10 mg Rectal Daily  . budesonide (PULMICORT) nebulizer solution  0.5 mg Nebulization BID  . busPIRone  5 mg Oral BID  . Chlorhexidine Gluconate Cloth  6 each Topical Daily  . docusate sodium  200 mg Oral Daily  . feeding supplement (GLUCERNA SHAKE)  237 mL Oral BID BM  . fluticasone  1 spray Each Nare Daily  . gabapentin  300 mg Oral Daily  . guaiFENesin  600 mg Oral BID  . insulin aspart  0-15 Units Subcutaneous TID WC  . insulin aspart  0-5 Units Subcutaneous QHS  . levothyroxine  25 mcg Oral QAC breakfast  . mouth rinse  15 mL Mouth Rinse BID  . meropenem (MERREM) IV  1 g Intravenous Q12H  . metoCLOPramide (REGLAN) injection  10 mg Intravenous Q6H  . pantoprazole  40 mg Oral Q1200  . potassium chloride  40 mEq Oral BID  . sildenafil  20 mg Oral BID  . sodium chloride flush  10-40 mL Intracatheter Q12H  . umeclidinium bromide  1 puff Inhalation Daily  . Warfarin - Pharmacist Dosing Inpatient   Does not apply q1800    Infusions: .  sodium chloride    . sodium chloride 20 mL/hr at 12/23/16 0700  . furosemide (LASIX) infusion 10 mg/hr (12/23/16 0700)  . lactated ringers 20 mL/hr at 12/13/16 1900  . lactated ringers Stopped (12/21/16 1901)  . milrinone 0.25 mcg/kg/min (12/23/16 0700)  . norepinephrine (LEVOPHED) Adult infusion 2 mcg/min (12/23/16 0700)    PRN Medications: Place/Maintain arterial line **AND** sodium chloride, acetaminophen, levalbuterol, ondansetron (ZOFRAN) IV, oxyCODONE,  sodium chloride flush, traMADol, traZODone   Assessment/Plan/Discussion    1. Acute on chronic systolic CHF: Ischemic cardiomyopathy but with prominent RV dysfunction on echo and exam.  [employer] ICD.  Echo this admission showed EF 20-25% with moderately dilated LV and moderately dilated/moderately dysfunctional RV with severe TR.  He was admitted with low output biventricular failure, cardiorenal syndrome, and marked volume overload. Failed dual inotrope support. IABP placed 2/7. HM-II and TV repair performed 2/9 after discussion with [facility] transplant team. POD #3 HM-II and TV repair.  Extubated 2/11, off NO, vasopressin, and epinephrine. Speed decreased to 8800 rpm after ramp echo on 2/16 for RV support. Remains on milrinone 0.25 and norepinephrine 2.  Co-ox 71%, CVP 10-14 with good UOP yesterday. - I will turn speed back up to 9000 to hopefully help with norepinephrine weaning. There was not a marked difference on ramp echo between 8800 and 9000.  Hopefully can turn off norepinephrine today.  - Continue Lasix gtt at 10 mg/hr today, volume status gradually improving.  Still a lot over his pre-op weight.   - Continue milrinone 0.25 for RV support. Would like to start to wean soon.   - Sildenafil begun at 20 mg tid for RV failure.  Now off NO.  - Continue warfarin, ASA 81 daily.  INR low, will need warfarin adjustment.  2. CAD: s/p CABG.  No signs/sx of ischemia.  3. AKI on CKD stage III: Suspect cardiorenal syndrome in setting of low output failure/RV failure.  Creatinine down today.   4. Atrial fibrillation: Chronic.  Rate is not elevated.  5. Post-op anemia: Also component of anemia of renal disease/chronic disease.  He got 1 unit PRBCs 2/12, appropriate increase in hgb.  Would transfuse hgb < 8.  6. Acute liver failure, post-op: Bilirubin improved. Likely hypoperfusion. Holding hepato-toxic agents.  7. Aortic stenosis s/p TAVR: Stable on echo.  8. ID: Afebrile.  WBCs elevated stably.  Pseudomonas  in respiratory culture, suspect tracheobronchitis.  Covering with meropenem. Acinetobacter also noted, discussed with ID and not treating at this time.  9. 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  10. Hyponatremia: Hypervolemic hyponatremia.  Fluid restrict.  11. Pocket site pain: Better with gabapentin.   Loralie Champagne, MD  12/23/2016, 7:23 AM  Advanced Heart Failure Team Pager [phone] (M-F; 7a - 4p)  Please contact [facility] for night-coverage after hours (4p -7a ) and weekends on [email or web address]

## 2016-12-23 NOTE — Progress Notes (Signed)
ANTICOAGULATION CONSULT NOTE - Follow Up Consult  Pharmacy Consult for warfarin Indication: atrial fibrillation/LVAD  Allergies  Allergen Reactions  . No Known Allergies     Patient Measurements: Height: 5\' 3"  (160 cm) Weight: 196 lb 6.9 oz (89.1 kg) IBW/kg (Calculated) : 56.9  Vital Signs: Temp: 99 F (37.2 C) (02/19 0842) Temp Source: Oral (02/19 0842) BP: 95/75 (02/19 0805) Pulse Rate: 91 (02/19 0810)  Labs:  Recent Labs  12/21/16 0258 12/22/16 0401 12/23/16 0505  HGB 8.3* 8.3* 8.3*  HCT 24.9* 24.8* 25.1*  PLT 180 181 209  LABPROT 23.2* 20.8* 19.1*  INR 2.03 1.77 1.59  CREATININE 2.38* 2.12* 1.85*    Estimated Creatinine Clearance: 36.2 mL/min (by C-G formula based on SCr of 1.85 mg/dL (H)).    Assessment: 70yom on apixaban pta for afib (last dose 1/31 @ 2110), transitioned to IV heparin for LVAD placement (2/9).   Patient now POD #9 on warfarin.  Bivalirudan off   INR fell > 1.6 this am, after reduced dose given on 2/16. Hgb stable at 8.3, pltc continues to improve at 181. LDH stable. Will give extra warfarin today to try and correct trend. SRA negtive.  Goal of Therapy:  INR 2-2.5 Monitor platelets by anticoagulation protocol: Yes   Plan:  Warfarin 4 mg this morning Daily protime, CBC  [name] Pharm.D. CPP, BCPS Clinical Pharmacist [phone] 12/23/2016 8:51 AM

## 2016-12-23 NOTE — Progress Notes (Signed)
Occupational Therapy Treatment Patient Details Name: Trevor [name] MRN: [id number] DOB: 1946/06/15 Today's Date: 12/23/2016    History of present illness  71 y.o.male with ischemic cardiomyopathy, severe tricuspid regurgitation, ejection fraction 20% and chronic renal insufficiency, CHF, CKD, DM2, asthma. The patient was in cardiogenic shock with severe volume overload and deterioration in renal function on admission s/p LVAD Heartmate II implant 2/9    OT comments  Pt pleased to have been moved out of ICU today, but fatigued. Performed bed mobility, seated grooming and standing. Educated in energy conservation strategies and provided handout.   Follow Up Recommendations  No OT follow up    Equipment Recommendations  None recommended by OT    Recommendations for Other Services      Precautions / Restrictions Precautions Precautions: Sternal;Fall Precaution Comments: pt able to state sternal precautions Restrictions Weight Bearing Restrictions: Yes Other Position/Activity Restrictions: sternal precautions       Mobility Bed Mobility Overal bed mobility: Needs Assistance Bed Mobility: Supine to Sit;Sit to Supine     Supine to sit: Mod assist Sit to supine: Min assist      Transfers   Equipment used: 1 person hand held assist Transfers: Sit to/from Stand Sit to Stand: Min guard         General transfer comment: stood and took several steps to [facility]    Balance Overall balance assessment: Needs assistance   Sitting balance-[name]: Good                             ADL Overall ADL's : Needs assistance/impaired     Grooming: Wash/dry hands;Wash/dry face;Oral care;Sitting;Set up           Upper Body Dressing : Minimal assistance;Sitting Upper Body Dressing Details (indicate cue type and reason): gown                   General ADL Comments: educated pt in energy conservation strategies and reinforced with handout       Vision                     Perception     Praxis      Cognition   Behavior During Therapy: WFL for tasks assessed/performed Overall Cognitive Status: Within Functional Limits for tasks assessed                         Exercises     Shoulder Instructions       General Comments      Pertinent Vitals/ Pain       Pain Assessment: No/denies pain  Home Living                                          Prior Functioning/Environment              Frequency  Min 3X/week        Progress Toward Goals  OT Goals(current goals can now be found in the care plan section)  Progress towards OT goals: Progressing toward goals  Acute Rehab OT Goals Time For Goal Achievement: 12/31/16 Potential to Achieve Goals: Good  Plan Discharge plan remains appropriate    Co-evaluation                 End of Session  Equipment Utilized During Treatment: Oxygen  OT Visit Diagnosis: Unsteadiness on feet (R26.81);Muscle weakness (generalized) (M62.81)   Activity Tolerance Patient limited by fatigue   Patient Left in bed;with call bell/phone within reach   Nurse Communication          Time: 1520-1540 OT Time Calculation (min): 20 min  Charges: OT General Charges $OT Visit: 1 Procedure OT Treatments $Self Care/Home Management : 8-22 mins     Malka So 12/23/2016, 3:54 PM  [phone]

## 2016-12-23 NOTE — Progress Notes (Signed)
10 Days Post-Op Procedure(s) (LRB): INSERTION OF IMPLANTABLE LEFT VENTRICULAR ASSIST DEVICE (N/A) TRICUSPID VALVE REPAIR WITH EDWARDS MC 3 TRICUSPID ANNULOPLASTY RING MODEL 4900 SIZE T 28 (N/A) TRANSESOPHAGEAL ECHOCARDIOGRAM (TEE) (N/A) Subjective: Walking several hundred feet afib with MAP > 90- stop norepi ready to transfer to stepdown Surgical incisions clean meropenun for 2 more days for pseudomonas tracheobronchitis Objective: Vital signs in last 24 hours: Temp:  [98.2 F (36.8 C)-99.2 F (37.3 C)] 99 F (37.2 C) (02/19 0842) Pulse Rate:  [33-97] 33 (02/19 1100) Cardiac Rhythm: Atrial fibrillation (02/19 0805) Resp:  [12-21] 21 (02/19 1100) BP: (81-114)/(54-99) 92/63 (02/19 1100) SpO2:  [89 %-100 %] 98 % (02/19 1100) Arterial Line BP: (73-99)/(54-71) 91/70 (02/19 0810) Weight:  [196 lb 6.9 oz (89.1 kg)] 196 lb 6.9 oz (89.1 kg) (02/19 0600)  Hemodynamic parameters for last 24 hours: CVP:  [12 mmHg-14 mmHg] 14 mmHg  Intake/Output from previous day: 02/18 0701 - 02/19 0700 In: 2198.2 [P.O.:1200; I.V.:848.2; IV Piggyback:150] Out: [id number] [Urine:4690] Intake/Output this shift: Total I/O In: 482.6 [P.O.:360; I.V.:122.6] Out: 370 [Urine:370]       Exam    General- alert and comfortable   Lungs- clear without rales, wheezes   Cor- afib with normal VAD humm, no murmur , gallop   Abdomen- soft, non-tender   Extremities - warm, non-tender, minimal edema   Neuro- oriented, appropriate, no focal weakness   Lab Results:  Recent Labs  12/22/16 0401 12/23/16 0505  WBC 14.5* 13.4*  HGB 8.3* 8.3*  HCT 24.8* 25.1*  PLT 181 209   BMET:  Recent Labs  12/22/16 0401 12/23/16 0505  NA 128* 129*  K 3.4* 3.5  CL 88* 87*  CO2 31 34*  GLUCOSE 158* 169*  BUN 66* 65*  CREATININE 2.12* 1.85*  CALCIUM 8.6* 8.5*    PT/INR:  Recent Labs  12/23/16 0505  LABPROT 19.1*  INR 1.59   ABG    Component Value Date/Time   PHART 7.357 12/17/2016 0415   HCO3 27.2 12/17/2016  0415   TCO2 29 12/19/2016 1458   O2SAT 71.4 12/23/2016 0500   CBG (last 3)   Recent Labs  12/22/16 1637 12/22/16 2201 12/23/16 0837  GLUCAP 175* 231* 168*    Assessment/Plan: S/P Procedure(s) (LRB): INSERTION OF IMPLANTABLE LEFT VENTRICULAR ASSIST DEVICE (N/A) TRICUSPID VALVE REPAIR WITH EDWARDS MC 3 TRICUSPID ANNULOPLASTY RING MODEL 4900 SIZE T 28 (N/A) TRANSESOPHAGEAL ECHOCARDIOGRAM (TEE) (N/A) Mobilize Diuresis transfer to stepdown Follow creat  LOS: 25 days    Trevor [name] 12/23/2016

## 2016-12-23 NOTE — Progress Notes (Signed)
CSW met at bedside with patient. He states he is feeling well and pleased with his progress. CSW able to introduce another LVAD patient for brief meet n greet. Patient thrilled to meet another patient and looking forward to eventually making it to an LVAD Support Group meeting. CSW will continue to follow for support throughout implant hospitalization. Raquel Sarna, [place], [place]

## 2016-12-24 LAB — CBC
HCT: 24.4 % — ABNORMAL LOW (ref 39.0–52.0)
Hemoglobin: 8 g/dL — ABNORMAL LOW (ref 13.0–17.0)
MCH: 29.9 pg (ref 26.0–34.0)
MCHC: 32.8 g/dL (ref 30.0–36.0)
MCV: 91 fL (ref 78.0–100.0)
Platelets: 188 10*3/uL (ref 150–400)
RBC: 2.68 MIL/uL — ABNORMAL LOW (ref 4.22–5.81)
RDW: 17.4 % — ABNORMAL HIGH (ref 11.5–15.5)
WBC: 13.2 10*3/uL — ABNORMAL HIGH (ref 4.0–10.5)

## 2016-12-24 LAB — COMPREHENSIVE METABOLIC PANEL
ALK PHOS: 149 U/L — AB (ref 38–126)
ALT: 49 U/L (ref 17–63)
AST: 59 U/L — AB (ref 15–41)
Albumin: 2.6 g/dL — ABNORMAL LOW (ref 3.5–5.0)
Anion gap: 9 (ref 5–15)
BUN: 62 mg/dL — AB (ref 6–20)
CALCIUM: 8.5 mg/dL — AB (ref 8.9–10.3)
CHLORIDE: 87 mmol/L — AB (ref 101–111)
CO2: 36 mmol/L — AB (ref 22–32)
Creatinine, Ser: 1.64 mg/dL — ABNORMAL HIGH (ref 0.61–1.24)
GFR, EST AFRICAN AMERICAN: 47 mL/min — AB (ref >60)
GFR, EST NON AFRICAN AMERICAN: 40 mL/min — AB (ref >60)
Glucose, Bld: 149 mg/dL — ABNORMAL HIGH (ref 65–99)
Potassium: 3.7 mmol/L (ref 3.5–5.1)
SODIUM: 132 mmol/L — AB (ref 135–145)
Total Bilirubin: 1.5 mg/dL — ABNORMAL HIGH (ref 0.3–1.2)
Total Protein: 5.9 g/dL — ABNORMAL LOW (ref 6.5–8.1)

## 2016-12-24 LAB — MISC LABCORP TEST (SEND OUT): LABCORP TEST CODE: 96388

## 2016-12-24 LAB — GLUCOSE, CAPILLARY
GLUCOSE-CAPILLARY: 201 mg/dL — AB (ref 65–99)
GLUCOSE-CAPILLARY: 163 mg/dL — AB (ref 65–99)
Glucose-Capillary: 149 mg/dL — ABNORMAL HIGH (ref 65–99)
Glucose-Capillary: 173 mg/dL — ABNORMAL HIGH (ref 65–99)

## 2016-12-24 LAB — PROTIME-INR
INR: 1.5
Prothrombin Time: 18.3 seconds — ABNORMAL HIGH (ref 11.4–15.2)

## 2016-12-24 LAB — COOXEMETRY PANEL
Carboxyhemoglobin: 2 % — ABNORMAL HIGH (ref 0.5–1.5)
Methemoglobin: 0.8 % (ref 0.0–1.5)
O2 Saturation: 70.5 %
Total hemoglobin: 13.9 g/dL (ref 12.0–16.0)

## 2016-12-24 LAB — HEPARIN LEVEL (UNFRACTIONATED)

## 2016-12-24 LAB — LACTATE DEHYDROGENASE: LDH: 388 U/L — ABNORMAL HIGH (ref 98–192)

## 2016-12-24 MED ORDER — FE FUMARATE-B12-VIT C-FA-IFC PO CAPS
1.0000 | ORAL_CAPSULE | Freq: Two times a day (BID) | ORAL | Status: DC
  Administered 2016-12-24 – 2017-01-01 (×17): 1 via ORAL
  Filled 2016-12-24 (×17): qty 1

## 2016-12-24 MED ORDER — SODIUM CHLORIDE 0.9 % IV SOLN
0.0400 mg/kg/h | INTRAVENOUS | Status: DC
  Administered 2016-12-24: 0.04 mg/kg/h via INTRAVENOUS
  Filled 2016-12-24: qty 250

## 2016-12-24 MED ORDER — HEPARIN (PORCINE) IN NACL 100-0.45 UNIT/ML-% IJ SOLN
1200.0000 [IU]/h | INTRAMUSCULAR | Status: DC
  Administered 2016-12-24: 1000 [IU]/h via INTRAVENOUS
  Administered 2016-12-25 – 2016-12-26 (×2): 1150 [IU]/h via INTRAVENOUS
  Administered 2016-12-26 – 2016-12-31 (×4): 1200 [IU]/h via INTRAVENOUS
  Filled 2016-12-24 (×9): qty 250

## 2016-12-24 MED ORDER — SORBITOL 70 % SOLN
30.0000 mL | Freq: Once | Status: DC
  Filled 2016-12-24: qty 30

## 2016-12-24 MED ORDER — MILRINONE LACTATE IN DEXTROSE 20-5 MG/100ML-% IV SOLN
0.1250 ug/kg/min | INTRAVENOUS | Status: DC
  Administered 2016-12-24 – 2016-12-25 (×3): 0.125 ug/kg/min via INTRAVENOUS
  Filled 2016-12-24 (×2): qty 100

## 2016-12-24 MED ORDER — METOLAZONE 5 MG PO TABS
5.0000 mg | ORAL_TABLET | Freq: Once | ORAL | Status: AC
Start: 2016-12-24 — End: 2016-12-24
  Administered 2016-12-24: 5 mg via ORAL
  Filled 2016-12-24: qty 1

## 2016-12-24 MED ORDER — WARFARIN SODIUM 5 MG PO TABS
5.0000 mg | ORAL_TABLET | Freq: Once | ORAL | Status: AC
  Administered 2016-12-24: 5 mg via ORAL
  Filled 2016-12-24: qty 1

## 2016-12-24 NOTE — Progress Notes (Signed)
ANTICOAGULATION CONSULT NOTE - Follow Up Consult  Pharmacy Consult for heparin while INR subtherapeutic Indication: atrial fibrillation/LVAD  Allergies  Allergen Reactions  . No Known Allergies     Patient Measurements: Height: 5\' 3"  (160 cm) Weight: 197 lb 1.6 oz (89.4 kg) IBW/kg (Calculated) : 56.9  Heparin dosing weight: 76kg  Vital Signs: Temp: 98 F (36.7 C) (02/20 1300) Temp Source: Oral (02/20 1300) BP: 85/68 (02/20 0820) Pulse Rate: 75 (02/20 1300)  Labs:  Recent Labs  12/22/16 0401 12/23/16 0505 12/24/16 0408 12/24/16 1556  HGB 8.3* 8.3* 8.0*  --   HCT 24.8* 25.1* 24.4*  --   PLT 181 209 188  --   LABPROT 20.8* 19.1* 18.3*  --   INR 1.77 1.59 1.50  --   HEPARINUNFRC  --   --   --  <0.10*  CREATININE 2.12* 1.85* 1.64*  --     Estimated Creatinine Clearance: 40.8 mL/min (by C-G formula based on SCr of 1.64 mg/dL (H)).    Assessment: 70yom on apixaban pta for afib (last dose 1/31 @ 2110), transitioned to IV heparin for LVAD placement (2/9).   Patient now POD #10 on warfarin.  Bivalirudan off as negative SRA.  Heparin level undetectable this evening. No bleeding noted.  Goal of Therapy:  INR 2-2.5 Goal Heparin level 0.2-0.3 Monitor platelets by anticoagulation protocol: Yes   Plan:  -Increase Heparin to 1100 units/hr, no bolus -Will try to minimize lab draws per MD request- next heparin level in 6 hours due to high risk with low INR -Daily heparin level and CBC  Ireoluwa Gorsline D. Timika Muench, PharmD, BCPS Clinical Pharmacist Pager: [phone] 12/24/2016 5:24 PM

## 2016-12-24 NOTE — Progress Notes (Signed)
Patient ID: Trevor [name], male   DOB: April 28, 1946, 71 y.o.   MRN: [id number]    Advanced Heart Failure Rounding Note   Subjective:    Admitted with marked volume overload and low output HF.  Echo EF 20-25%. Failed dual inotropes. IABP placed 2/7.   Underwent HM-II placement and TV repair on 2/9.   1 unit PRBCs 2/12.   Respiratory cultures with Pseudomonas, meropenem started.  Acinetobacter also isolated, discussed with ID and recommended not treating this since extubated.    Remains on milrinone 0.25 mcg.  Ambulated 1000 feet.  Co-ox 71%.  Still significant peripheral edema.   Overall feeling better. Denies SOB at rest.   LVAD parameters:   Speed 9000  Flow 5.9 PI 4.66 Power 5.6 Rare PI.   Objective:   Weight Range:  Vital Signs:   Temp:  [97.6 F (36.4 C)-99 F (37.2 C)] 97.7 F (36.5 C) (02/20 0451) Pulse Rate:  [33-90] 76 (02/20 0451) Resp:  [15-21] 18 (02/20 0451) BP: (79-92)/(63-75) 89/75 (02/20 0000) SpO2:  [91 %-100 %] 98 % (02/20 0451) Weight:  [197 lb 1.6 oz (89.4 kg)] 197 lb 1.6 oz (89.4 kg) (02/20 0500) Last BM Date: 12/20/16  Weight change: Filed Weights   12/22/16 0700 12/23/16 0600 12/24/16 0500  Weight: 200 lb 1.6 oz (90.8 kg) 196 lb 6.9 oz (89.1 kg) 197 lb 1.6 oz (89.4 kg)    Intake/Output:   Intake/Output Summary (Last 24 hours) at 12/24/16 0810 Last data filed at 12/24/16 0407  Gross per 24 hour  Intake           930.67 ml  Output             1165 ml  Net          -234.33 ml     Physical Exam: General:  NAD. In the chair.  HEENT: normal   Neck: supple, JVP to jaw.  Cor: Sternal dressing in place. +CTs. LVAD hum  Lungs:  Decreased in the bases  Abdomen: obese soft, NT. Normal BS. Driveline site ok Extremities: no cyanosis, clubbing, rash.  2+ edema lower leg L>R.  Neuro: Alert/oriented.    Telemetry: Reviewed,  Atrial fibrillation, rate 80s   Labs: Basic Metabolic Panel:  Recent Labs Lab 12/18/16 0412  12/19/16 0400   12/20/16 0411 12/21/16 0258 12/22/16 0401 12/23/16 0505 12/24/16 0408  NA 128*  < > 131*  < > 130* 132* 128* 129* 132*  K 4.0  < > 3.7  < > 3.9 3.6 3.4* 3.5 3.7  CL 89*  < > 92*  < > 91* 93* 88* 87* 87*  CO2 27  --  29  --  28 30 31  34* 36*  GLUCOSE 122*  < > 148*  < > 174* 162* 158* 169* 149*  BUN 47*  < > 54*  < > 59* 67* 66* 65* 62*  CREATININE 2.14*  < > 2.24*  < > 2.32* 2.38* 2.12* 1.85* 1.64*  CALCIUM 8.6*  --  8.5*  --  8.8* 8.7* 8.6* 8.5* 8.5*  MG 1.9  --  2.0  --  2.0  --   --   --   --   < > = values in this interval not displayed.  Liver Function Tests:  Recent Labs Lab 12/20/16 0411 12/21/16 0258 12/22/16 0401 12/23/16 0505 12/24/16 0408  AST 49* 55* 60* 50* 59*  ALT 46 48 51 45 49  ALKPHOS 129* 133* 140* 135* 149*  BILITOT 1.8* 1.6* 1.7* 1.5* 1.5*  PROT 6.0* 5.8* 5.7* 5.7* 5.9*  ALBUMIN 2.8* 2.7* 2.5* 2.5* 2.6*   No results for input(s): LIPASE, AMYLASE in the last 168 hours. No results for input(s): AMMONIA in the last 168 hours.  CBC:  Recent Labs Lab 12/18/16 0412  12/19/16 0400  12/20/16 0411 12/21/16 0258 12/22/16 0401 12/23/16 0505 12/24/16 0408  WBC 11.6*  < > 13.2*  --  16.6* 16.0* 14.5* 13.4* 13.2*  NEUTROABS 8.4*  --  9.9*  --  12.9*  --   --   --   --   HGB 8.6*  < > 8.9*  < > 8.6* 8.3* 8.3* 8.3* 8.0*  HCT 25.8*  < > 26.6*  < > 25.9* 24.9* 24.8* 25.1* 24.4*  MCV 89.9  < > 90.8  --  90.9 91.2 91.5 90.9 91.0  PLT 92*  < > 124*  --  149* 180 181 209 188  < > = values in this interval not displayed.  Cardiac Enzymes: No results for input(s): CKTOTAL, CKMB, CKMBINDEX, TROPONINI in the last 168 hours.  BNP: BNP (last 3 results)  Recent Labs  11/28/16 1104 12/14/16 0348  BNP 1,825.3* 427.7*    ProBNP (last 3 results)  Recent Labs  11/27/16 1207  PROBNP 1,746.0*      Other results:  Imaging: Dg Chest Port 1 View  Result Date: 12/23/2016 CLINICAL DATA:  Left ventricular assist device. EXAM: PORTABLE CHEST 1 VIEW  COMPARISON:  12/21/2016. FINDINGS: Cardiac pacer in stable position. Left ventricular assist device in stable position. Prior CABG and cardiac valve repair. Cardiomegaly with mild bilateral pulmonary interstitial prominence noted suggesting mild CHF . Persistent low lung volumes. Small left pleural effusion. No pneumothorax. IMPRESSION: 1. Cardiac pacer and left ventricular assist device in stable position. 2. Prior CABG and cardiac valve repair. Cardiomegaly with mild bilateral interstitial prominence of small left pleural effusions suggesting mild CHF. 3. Low lung volumes. Electronically Signed   By: Marcello Moores  Register   On: 12/23/2016 07:21     Medications:     Scheduled Medications: . arformoterol  15 mcg Nebulization BID  . aspirin  81 mg Oral Daily  . bisacodyl  10 mg Oral Daily   Or  . bisacodyl  10 mg Rectal Daily  . budesonide (PULMICORT) nebulizer solution  0.5 mg Nebulization BID  . busPIRone  5 mg Oral BID  . Chlorhexidine Gluconate Cloth  6 each Topical Daily  . docusate sodium  200 mg Oral Daily  . feeding supplement (GLUCERNA SHAKE)  237 mL Oral BID BM  . fluticasone  1 spray Each Nare Daily  . furosemide  80 mg Intravenous BID  . gabapentin  300 mg Oral Daily  . guaiFENesin  600 mg Oral BID  . insulin aspart  0-15 Units Subcutaneous TID WC  . insulin aspart  0-5 Units Subcutaneous QHS  . levothyroxine  25 mcg Oral QAC breakfast  . mouth rinse  15 mL Mouth Rinse BID  . meropenem (MERREM) IV  1 g Intravenous Q12H  . pantoprazole  40 mg Oral Q1200  . potassium chloride  40 mEq Oral BID  . sildenafil  20 mg Oral TID  . sodium chloride flush  10-40 mL Intracatheter Q12H  . umeclidinium bromide  1 puff Inhalation Daily  . Warfarin - Pharmacist Dosing Inpatient   Does not apply q1800    Infusions: . sodium chloride    . sodium chloride 20 mL/hr at 12/23/16 0700  .  bivalirudin (ANGIOMAX) infusion 0.5 mg/mL (Non-ACS indications)    . lactated ringers Stopped (12/21/16  1901)  . milrinone 0.25 mcg/kg/min (12/23/16 2332)    PRN Medications: Place/Maintain arterial line **AND** sodium chloride, acetaminophen, levalbuterol, ondansetron (ZOFRAN) IV, oxyCODONE, sodium chloride flush, traMADol, traZODone   Assessment/Plan/Discussion    1. Acute on chronic systolic CHF: Ischemic cardiomyopathy but with prominent RV dysfunction on echo and exam.  [employer] ICD.  Echo this admission showed EF 20-25% with moderately dilated LV and moderately dilated/moderately dysfunctional RV with severe TR.  He was admitted with low output biventricular failure, cardiorenal syndrome, and marked volume overload. Failed dual inotrope support. IABP placed 2/7. HM-II and TV repair performed 2/9 after discussion with [facility] transplant team. POD #3 HM-II and TV repair.  Extubated 2/11, off NO, vasopressin, and epinephrine. Speed decreased to 8800 rpm after ramp echo on 2/16 for RV support but increased back to 9000 to help get him off norepinephrine.  Now off norepinephrine. Remains on milrinone 0.25 mcg.  He is still volume overloaded on exam.  - Co-ox 71%--> Cut back milrinone to 0.125 mcg.  - Continue LVAD speed at 9000 - Continue Lasix 80 mg twice a day and give a dose 2.5 metolazone today.    - Wean milrinone to 0.125.   - Sildenafil begun at 20 mg tid for RV failure.  Now off NO.  - Continue warfarin, ASA 81 daily.  INR 1.5, starting bivalirudin per Dr Prescott Gum.  SRA negative, probably could use heparin gtt, pharmacy to discuss with Dr. Prescott Gum.  - On asa 81 mg daily  2. CAD: s/p CABG.  No signs/sx of ischemia.  3. AKI on CKD stage III: Suspect cardiorenal syndrome in setting of low output failure/RV failure.  Creatinine trending down 1.8>1.6 .   4. Atrial fibrillation: Chronic.  Rate is not elevated.  5. Post-op anemia: Also component of anemia of renal disease/chronic disease.  He got 1 unit PRBCs 2/12, appropriate increase in hgb.  Today's Hgb 8.0. Will need transfusion if drops  lower.  6. Acute liver failure, post-op: Bilirubin improved. Likely hypoperfusion. Holding hepato-toxic agents.  7. Aortic stenosis s/p TAVR: Stable on echo.  8. ID: Afebrile.  WBCs unchanged 13.2.  Pseudomonas in respiratory culture, suspect tracheobronchitis.  Covering with meropenem. Acinetobacter also noted, discussed with ID and not treating at this time.  9. Thrombocytopenia: Platelets back to normal.  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  10. Hyponatremia: Hypervolemic hyponatremia.  Sodium 132 today. Fluid restrict.  11. Pocket site pain: Better with gabapentin.   Darrick Grinder, NP  12/24/2016, 8:10 AM  Advanced Heart Failure Team Pager [phone] (M-F; [place])  Please contact [facility] for night-coverage after hours (4p -7a ) and weekends on [email or web address]  Patient seen with NP, agree with the above note.  Steady progress, doing better with walks.  Creatinine down.  Good co-ox.  Still volume overloaded.  - Give dose of metolazone today with Lasix.  - Can decrease milrinone to 0.125 - Low INR, will need heparin vs bivalirudin, SRA for HIT negative so probably ok to be on heparin gtt. Discuss with Dr. Prescott Gum.   Loralie Champagne 12/24/2016 8:44 AM

## 2016-12-24 NOTE — Progress Notes (Addendum)
CARDIAC REHAB PHASE I   PRE:  Rate/Rhythm: 79 afib with PVCs    BP: sitting 80    SaO2: 90-91 RA  MODE:  Ambulation: 150 ft   POST:  Rate/Rhythm: 92 afib with PVCs    BP: sitting 80     SaO2: 97 2L  Pt tired today. Sts he didn't sleep well last night. Able to don his batteries with small mistakes. Able to stand without assist. Took a few steps without O2 but SaO2 borderline at 90 and pt felt more comfortable on O2. Slow pace with RW, steady. Rest x2. Sts he just feels tired today. Also sts his knees are hurting. To EOB with plans to recline after he eats his [place] ice. Encouraged another walk later this evening.  Allen, ACSM 12/24/2016 2:05 PM

## 2016-12-24 NOTE — Progress Notes (Signed)
HeartMate 2 Rounding Note  Postop day 11 implantation HeartMate 2 VAD and tricuspid valve annuloplasty repair  Subjective:   Patient with ischemic cardiomyopathy, severe tricuspid regurgitation, ejection fraction 20% and chronic renal insufficiency. The patient was in cardiogenic shock with severe volume overload and deterioration in renal function on admission. He required inotropes and subsequently balloon pump in order to optimize his hemodynamic and renal status prior to VAD implantation on February 9.  [name] surgery was complicated by moderate RV dysfunction requiring inotropes and coagulopathy requiring platelets and component transfusion.     The patient has been transferred to [place]- all drains out Drive line exit site clean, dry Walking 500 ft  Renal fx improved- creat 1.7. Remains on iv lasix Milrinone weaned to .125   LVAD parameters stable with few PI events  Remains in atrial fibrillation Coumadin per pharmD- INR low and iv lasix started  LVAD INTERROGATION:  HeartMate II LVAD:  Flow 5.2 liters/min, speed 9000 RPM, power 5.1, PI 5.2.  Controller intact  Patient  with less airway secretions-sputum cultures now positive for Pseudomonas and meropenem started 2-14- last dose 2-21 pm Also colonization with Acinetobacter noted, ID recommends no formal antibiotic therapy since patient has been extubated    Last Echocardiogram shows adequate RV function, no pericardial effusion, no tricuspid regurgitation, inflow cannula in good position, septum fairly midline.        Vital Signs:   Temp:  [97.6 F (36.4 C)-98.4 F (36.9 C)] 97.7 F (36.5 C) (02/20 0451) Pulse Rate:  [33-76] 76 (02/20 0451) Resp:  [17-21] 18 (02/20 0451) BP: (79-92)/(63-75) 85/68 (02/20 0820) SpO2:  [98 %-100 %] 98 % (02/20 0451) Weight:  [197 lb 1.6 oz (89.4 kg)] 197 lb 1.6 oz (89.4 kg) (02/20 0500) Last BM Date: 12/20/16 Mean arterial Pressure70-25mmHg  Intake/Output:   Intake/Output  Summary (Last 24 hours) at 12/24/16 0947 Last data filed at 12/24/16 0835  Gross per 24 hour  Intake              531 ml  Output             1565 ml  Net            -1034 ml     Physical Exam: General:  Well appearing. No resp difficulty, Comfortable in chair HEENT: normal Neck: supple. JVP normal . Carotids trace pulsatility bilat; no bruits. No lymphadenopathy or thryomegaly appreciated. Cor: Mechanical heart sounds with LVAD hum present. Lungs: clear, cough congested Abdomen: soft, nontender, nondistended. No hepatosplenomegaly. No bruits or masses. Good bowel sounds. Extremities: no cyanosis, clubbing, rash, edema Neuro: alert & orientedx3, cranial nerves grossly intact. moves all 4 extremities w/o difficulty. Affect pleasant  Telemetry: Paced 75/m, atrial fibrillation  Labs: Basic Metabolic Panel:  Recent Labs Lab 12/18/16 0412  12/19/16 0400  12/20/16 0411 12/21/16 0258 12/22/16 0401 12/23/16 0505 12/24/16 0408  NA 128*  < > 131*  < > 130* 132* 128* 129* 132*  K 4.0  < > 3.7  < > 3.9 3.6 3.4* 3.5 3.7  CL 89*  < > 92*  < > 91* 93* 88* 87* 87*  CO2 27  --  29  --  28 30 31  34* 36*  GLUCOSE 122*  < > 148*  < > 174* 162* 158* 169* 149*  BUN 47*  < > 54*  < > 59* 67* 66* 65* 62*  CREATININE 2.14*  < > 2.24*  < > 2.32* 2.38* 2.12* 1.85* 1.64*  CALCIUM 8.6*  --  8.5*  --  8.8* 8.7* 8.6* 8.5* 8.5*  MG 1.9  --  2.0  --  2.0  --   --   --   --   < > = values in this interval not displayed.  Liver Function Tests:  Recent Labs Lab 12/20/16 0411 12/21/16 0258 12/22/16 0401 12/23/16 0505 12/24/16 0408  AST 49* 55* 60* 50* 59*  ALT 46 48 51 45 49  ALKPHOS 129* 133* 140* 135* 149*  BILITOT 1.8* 1.6* 1.7* 1.5* 1.5*  PROT 6.0* 5.8* 5.7* 5.7* 5.9*  ALBUMIN 2.8* 2.7* 2.5* 2.5* 2.6*   No results for input(s): LIPASE, AMYLASE in the last 168 hours. No results for input(s): AMMONIA in the last 168 hours.  CBC:  Recent Labs Lab 12/18/16 0412  12/19/16 0400   12/20/16 0411 12/21/16 0258 12/22/16 0401 12/23/16 0505 12/24/16 0408  WBC 11.6*  < > 13.2*  --  16.6* 16.0* 14.5* 13.4* 13.2*  NEUTROABS 8.4*  --  9.9*  --  12.9*  --   --   --   --   HGB 8.6*  < > 8.9*  < > 8.6* 8.3* 8.3* 8.3* 8.0*  HCT 25.8*  < > 26.6*  < > 25.9* 24.9* 24.8* 25.1* 24.4*  MCV 89.9  < > 90.8  --  90.9 91.2 91.5 90.9 91.0  PLT 92*  < > 124*  --  149* 180 181 209 188  < > = values in this interval not displayed.  INR:  Recent Labs Lab 12/20/16 0411 12/21/16 0258 12/22/16 0401 12/23/16 0505 12/24/16 0408  INR 2.65 2.03 1.77 1.59 1.50    Other results:  EKG:   Imaging: Dg Chest Port 1 View  Result Date: 12/23/2016 CLINICAL DATA:  Left ventricular assist device. EXAM: PORTABLE CHEST 1 VIEW COMPARISON:  12/21/2016. FINDINGS: Cardiac pacer in stable position. Left ventricular assist device in stable position. Prior CABG and cardiac valve repair. Cardiomegaly with mild bilateral pulmonary interstitial prominence noted suggesting mild CHF . Persistent low lung volumes. Small left pleural effusion. No pneumothorax. IMPRESSION: 1. Cardiac pacer and left ventricular assist device in stable position. 2. Prior CABG and cardiac valve repair. Cardiomegaly with mild bilateral interstitial prominence of small left pleural effusions suggesting mild CHF. 3. Low lung volumes. Electronically Signed   By: Marcello Moores  Register   On: 12/23/2016 07:21     Medications:     Scheduled Medications: . arformoterol  15 mcg Nebulization BID  . aspirin  81 mg Oral Daily  . bisacodyl  10 mg Oral Daily   Or  . bisacodyl  10 mg Rectal Daily  . budesonide (PULMICORT) nebulizer solution  0.5 mg Nebulization BID  . busPIRone  5 mg Oral BID  . Chlorhexidine Gluconate Cloth  6 each Topical Daily  . docusate sodium  200 mg Oral Daily  . feeding supplement (GLUCERNA SHAKE)  237 mL Oral BID BM  . ferrous [id number] C-folic acid  1 capsule Oral BID  . fluticasone  1 spray Each Nare  Daily  . furosemide  80 mg Intravenous BID  . gabapentin  300 mg Oral Daily  . guaiFENesin  600 mg Oral BID  . insulin aspart  0-15 Units Subcutaneous TID WC  . insulin aspart  0-5 Units Subcutaneous QHS  . levothyroxine  25 mcg Oral QAC breakfast  . mouth rinse  15 mL Mouth Rinse BID  . meropenem (MERREM) IV  1 g Intravenous Q12H  . metolazone  5 mg Oral Once  . pantoprazole  40 mg Oral Q1200  . potassium chloride  40 mEq Oral BID  . sildenafil  20 mg Oral TID  . sodium chloride flush  10-40 mL Intracatheter Q12H  . sorbitol  30 mL Oral Once  . umeclidinium bromide  1 puff Inhalation Daily  . warfarin  5 mg Oral ONCE-1800  . Warfarin - Pharmacist Dosing Inpatient   Does not apply q1800    Infusions: . sodium chloride    . sodium chloride 20 mL/hr at 12/23/16 0700  . heparin    . milrinone 0.125 mcg/kg/min (12/24/16 0946)    PRN Medications: Place/Maintain arterial line **AND** sodium chloride, acetaminophen, levalbuterol, ondansetron (ZOFRAN) IV, oxyCODONE, sodium chloride flush, traMADol, traZODone   Assessment:  Acute on chronic systolic heart failure from ischemic cardiomyopathy treated with HeartMate 2 implantation for destination therapy indication Moderate RV failure requiring inotropes and inhaled nitric oxide Preoperative acute on chronic renal failure Severe tricuspid regurgitation corrected with 28 mm ring annuloplasty Preoperative COPD-asthmatic bronchitis with low diffusion capacity less than 40% predicted Postoperative coagulopathy and expected acute blood loss anemia-     Start po iron/ folate Acute liver failure  bilirubin 5.0 postop now improving < 3 Preoperative hyponatremia Postoperative HCAP Gram-negative rod-Pseudomonas from tracheal aspirate-Day 6 of 7  meropenem. White count 13-16k  Plan/Discussion:   Mobilize with PT Creatinine improved to 1.6, continue diuresis Continue twice a day IV Lasix Patient and family education with exit site dressings in  progress Continue ambulation One week of IV antibiotics for gram-negative tracheal- bronchitis with heavy airway secretions stop date 2-22  Cont to wean milrinone as tolerated  I reviewed the LVAD parameters from today, and compared the results to the patient's prior recorded data.  No programming changes were made.  The LVAD is functioning within specified parameters.  The patient performs LVAD self-test daily.  LVAD interrogation was negative for any significant power changes, alarms or PI events/speed drops.  LVAD equipment check completed and is in good working order.  Back-up equipment present.   LVAD education done on emergency procedures and precautions and reviewed exit site care.  Length of Stay: Dawson III 12/24/2016, 9:47 AM

## 2016-12-24 NOTE — Progress Notes (Signed)
ANTICOAGULATION CONSULT NOTE - Follow Up Consult  Pharmacy Consult for warfarin; add heparin while INR subtherapeutic Indication: atrial fibrillation/LVAD  Allergies  Allergen Reactions  . No Known Allergies     Patient Measurements: Height: 5\' 3"  (160 cm) Weight: 197 lb 1.6 oz (89.4 kg) IBW/kg (Calculated) : 56.9  Vital Signs: Temp: 97.7 F (36.5 C) (02/20 0451) Temp Source: Oral (02/20 0451) BP: 85/68 (02/20 0820) Pulse Rate: 76 (02/20 0451)  Labs:  Recent Labs  12/22/16 0401 12/23/16 0505 12/24/16 0408  HGB 8.3* 8.3* 8.0*  HCT 24.8* 25.1* 24.4*  PLT 181 209 188  LABPROT 20.8* 19.1* 18.3*  INR 1.77 1.59 1.50  CREATININE 2.12* 1.85* 1.64*    Estimated Creatinine Clearance: 40.8 mL/min (by C-G formula based on SCr of 1.64 mg/dL (H)).    Assessment: 70yom on apixaban pta for afib (last dose 1/31 @ 2110), transitioned to IV heparin for LVAD placement (2/9).   Patient now POD #10 on warfarin.  Bivalirudan off   INR falling today.  CBC fairly stable.  HIT antibody slightly positive, but SRA resulted today and is negative.  Dr. Prescott Gum ordered bivalirudin today given low INR, but after discussion will begin IV heparin given negative SRA.  Goal of Therapy:  INR 2-2.5 Goal Heparin level 0.2-0.3 Monitor platelets by anticoagulation protocol: Yes   Plan:  Warfarin 5 mg x 1 Heparin 1000 units/hr, no bolus Check heparin level in 6 hrs, will try to minimize lab draws per MD request. Daily protime, CBC  Uvaldo Rising, BCPS  Clinical Pharmacist Pager [phone]  12/24/2016 9:52 AM

## 2016-12-24 NOTE — Care Management Note (Signed)
Case Management Note  Patient Details  Name: Trevor [name] MRN: [id number] Date of Birth: 11-10-1945  Subjective/Objective:   S/p LVAD placement 2/9                 Action/Plan:  PTA independent from home with sister.  LVAD team following closely.  Pt has recommendations for HH - no orders as of yet.  CM will continue to follow for discharge needs   Expected Discharge Date:                  Expected Discharge Plan:  [place]  In-House Referral:  Clinical Social Work  Discharge planning Services  CM Consult  Post Acute Care Choice:  Home Health Choice offered to:  Patient, Sibling  DME Arranged:    DME Agency:     HH Arranged:  RN [place] Agency:  [place]  Status of Service:  In process, will continue to follow  If discussed at Long Length of Stay Meetings, dates discussed:    Additional Comments: 12/24/2016 Discussed in LOS 12/24/16 - pt remains appropriate for continued stay.  Pt had to be restarted on a pressor while asleep due to drop in BP per HF team note.  Per Physician Advisor- if pt continues to need norepi on an intermittent basis pt may be appropriate for [facility] referral.  Pt has transferred to [facility] will continue to monitor for discharge planning and appropriate disposition.    12/19/16 CM spoke in depth with pt - pt alert and oriented - remains on Milrinone and IV lasix.  Discussed in LOS 12/19/16 - pt remains appropriate for continued stay.  Pt confirmed he will discharge home with his sister - sister will provided around the clock supervision at discharge.   CM spoke with pt about [facility] recommended - pt had no preference with HH/DME agency- pt is in agreement with [facility] (LVAD agency of choice).  CM will arrange [facility] and DME when orders are written - agency is aware of tentative referral.   CM will continue to follow for discharge needs [name], RN 12/24/2016, 8:46 AM

## 2016-12-25 ENCOUNTER — Inpatient Hospital Stay (HOSPITAL_COMMUNITY): Payer: Medicare Other

## 2016-12-25 LAB — CBC
HCT: 25.6 % — ABNORMAL LOW (ref 39.0–52.0)
Hemoglobin: 8.2 g/dL — ABNORMAL LOW (ref 13.0–17.0)
MCH: 29.4 pg (ref 26.0–34.0)
MCHC: 32 g/dL (ref 30.0–36.0)
MCV: 91.8 fL (ref 78.0–100.0)
Platelets: 179 10*3/uL (ref 150–400)
RBC: 2.79 MIL/uL — ABNORMAL LOW (ref 4.22–5.81)
RDW: 17 % — ABNORMAL HIGH (ref 11.5–15.5)
WBC: 13.3 10*3/uL — ABNORMAL HIGH (ref 4.0–10.5)

## 2016-12-25 LAB — COMPREHENSIVE METABOLIC PANEL
ALT: 40 U/L (ref 17–63)
AST: 44 U/L — ABNORMAL HIGH (ref 15–41)
Albumin: 2.6 g/dL — ABNORMAL LOW (ref 3.5–5.0)
Alkaline Phosphatase: 136 U/L — ABNORMAL HIGH (ref 38–126)
Anion gap: 10 (ref 5–15)
BUN: 53 mg/dL — ABNORMAL HIGH (ref 6–20)
CO2: 35 mmol/L — ABNORMAL HIGH (ref 22–32)
Calcium: 8.4 mg/dL — ABNORMAL LOW (ref 8.9–10.3)
Chloride: 85 mmol/L — ABNORMAL LOW (ref 101–111)
Creatinine, Ser: 1.5 mg/dL — ABNORMAL HIGH (ref 0.61–1.24)
GFR calc Af Amer: 52 mL/min — ABNORMAL LOW (ref >60)
GFR calc non Af Amer: 45 mL/min — ABNORMAL LOW (ref >60)
Glucose, Bld: 198 mg/dL — ABNORMAL HIGH (ref 65–99)
Potassium: 3.7 mmol/L (ref 3.5–5.1)
Sodium: 130 mmol/L — ABNORMAL LOW (ref 135–145)
Total Bilirubin: 1 mg/dL (ref 0.3–1.2)
Total Protein: 5.8 g/dL — ABNORMAL LOW (ref 6.5–8.1)

## 2016-12-25 LAB — GLUCOSE, CAPILLARY
Glucose-Capillary: 173 mg/dL — ABNORMAL HIGH (ref 65–99)
Glucose-Capillary: 153 mg/dL — ABNORMAL HIGH (ref 65–99)
Glucose-Capillary: 207 mg/dL — ABNORMAL HIGH (ref 65–99)
Glucose-Capillary: 158 mg/dL — ABNORMAL HIGH (ref 65–99)

## 2016-12-25 LAB — HEPARIN LEVEL (UNFRACTIONATED)
HEPARIN UNFRACTIONATED: 0.2 [IU]/mL — AB (ref 0.30–0.70)
Heparin Unfractionated: 0.19 IU/mL — ABNORMAL LOW (ref 0.30–0.70)

## 2016-12-25 LAB — PROTIME-INR
INR: 1.47
PROTHROMBIN TIME: 17.9 s — AB (ref 11.4–15.2)

## 2016-12-25 LAB — CULTURE, RESPIRATORY W GRAM STAIN: Special Requests: NORMAL

## 2016-12-25 LAB — LACTATE DEHYDROGENASE: LDH: 341 U/L — ABNORMAL HIGH (ref 98–192)

## 2016-12-25 LAB — COOXEMETRY PANEL
Carboxyhemoglobin: 1.8 % — ABNORMAL HIGH (ref 0.5–1.5)
Methemoglobin: 1.3 % (ref 0.0–1.5)
O2 Saturation: 74.4 %
Total hemoglobin: 8.4 g/dL — ABNORMAL LOW (ref 12.0–16.0)

## 2016-12-25 MED ORDER — METOLAZONE 2.5 MG PO TABS
2.5000 mg | ORAL_TABLET | Freq: Once | ORAL | Status: AC
Start: 2016-12-25 — End: 2016-12-25
  Administered 2016-12-25: 2.5 mg via ORAL
  Filled 2016-12-25: qty 1

## 2016-12-25 MED ORDER — WARFARIN SODIUM 7.5 MG PO TABS
7.5000 mg | ORAL_TABLET | Freq: Once | ORAL | Status: AC
  Administered 2016-12-25: 7.5 mg via ORAL
  Filled 2016-12-25: qty 1

## 2016-12-25 NOTE — Progress Notes (Signed)
ANTICOAGULATION CONSULT NOTE - Follow Up Consult  Pharmacy Consult for heparin while INR subtherapeutic Indication: atrial fibrillation/LVAD  Allergies  Allergen Reactions  . No Known Allergies     Patient Measurements: Height: 5\' 3"  (160 cm) Weight: 197 lb 1.6 oz (89.4 kg) IBW/kg (Calculated) : 56.9  Heparin dosing weight: 76kg  Vital Signs: Temp: 98.4 F (36.9 C) (02/20 1952) Temp Source: Oral (02/20 1952) Pulse Rate: 72 (02/20 2344)  Labs:  Recent Labs  12/22/16 0401 12/23/16 0505 12/24/16 0408 12/24/16 1556 12/25/16 0018  HGB 8.3* 8.3* 8.0*  --   --   HCT 24.8* 25.1* 24.4*  --   --   PLT 181 209 188  --   --   LABPROT 20.8* 19.1* 18.3*  --   --   INR 1.77 1.59 1.50  --   --   HEPARINUNFRC  --   --   --  <0.10* 0.19*  CREATININE 2.12* 1.85* 1.64*  --   --     Estimated Creatinine Clearance: 40.8 mL/min (by C-G formula based on SCr of 1.64 mg/dL (H)).   Assessment: 70yom on apixaban pta for afib (last dose 1/31 @ 2110), transitioned to IV heparin for LVAD placement (2/9).   Patient now POD #11 on warfarin.  Bivalirudin off as negative SRA.  Heparin level up to 0.19 (just slightly subtherapeutic for low goal of 0.2-0.3) on gtt at 1100 units/hr. No bleeding noted.  Goal of Therapy:  INR 2-2.5 Goal Heparin level 0.2-0.3 Monitor platelets by anticoagulation protocol: Yes   Plan:  -Increase Heparin slightly to 1150 units/hr - aiming for narrow goal -Will try to minimize lab draws per MD request- next heparin level with a.m. Labs (~4hr post gtt change)  Sherlon Handing, PharmD, BCPS Clinical pharmacist, pager [phone] 12/25/2016 12:46 AM

## 2016-12-25 NOTE — Progress Notes (Signed)
ANTICOAGULATION CONSULT NOTE - Follow Up Consult  Pharmacy Consult for heparin while INR subtherapeutic Indication: atrial fibrillation/LVAD  Allergies  Allergen Reactions  . No Known Allergies     Patient Measurements: Height: 5\' 3"  (160 cm) Weight: 195 lb 6.4 oz (88.6 kg) IBW/kg (Calculated) : 56.9  Heparin dosing weight: 76kg  Vital Signs: Temp: 98.3 F (36.8 C) (02/21 0403) Temp Source: Oral (02/21 0403) BP: 92/71 (02/21 0752) Pulse Rate: 82 (02/21 0403)  Labs:  Recent Labs  12/23/16 0505 12/24/16 0408 12/24/16 1556 12/25/16 0018 12/25/16 0442 12/25/16 0920  HGB 8.3* 8.0*  --   --  8.2*  --   HCT 25.1* 24.4*  --   --  25.6*  --   PLT 209 188  --   --  179  --   LABPROT 19.1* 18.3*  --   --   --  17.9*  INR 1.59 1.50  --   --   --  1.47  HEPARINUNFRC  --   --  <0.10* 0.19*  --  0.20*  CREATININE 1.85* 1.64*  --   --  1.50*  --     Estimated Creatinine Clearance: 44.5 mL/min (by C-G formula based on SCr of 1.5 mg/dL (H)).   Assessment: 70yom on apixaban pta for afib (last dose 1/31 @ 2110), transitioned to IV heparin for LVAD placement (2/9).   Patient now POD #11 on warfarin.  Bivalirudin off as negative SRA.  Heparin level up to 0.20 (therapeutic at low goal of 0.2-0.3) on gtt at 1150 units/hr. No bleeding noted. CBC stable with platelets at 179.   INR still low at 1.47.   Goal of Therapy:  INR 2-2.5 Goal Heparin level 0.2-0.3 Monitor platelets by anticoagulation protocol: Yes   Plan:  -Increase Heparin slightly to 1200 units/hr - aiming for low narrow goal -Will try to minimize lab draws per MD request- next heparin level with a.m. Labs -Warfarin 7.5mg  tonight to try and expedite getting off heparin  Erin Hearing PharmD., BCPS Clinical Pharmacist Pager [phone] 12/25/2016 10:47 AM

## 2016-12-25 NOTE — Progress Notes (Signed)
Patient ID: Trevor [name], male   DOB: [birth year], 71 y.o.   MRN: [id number]    Advanced Heart Failure Rounding Note   Subjective:    Admitted with marked volume overload and low output HF.  Echo EF 20-25%. Failed dual inotropes. IABP placed 2/7.   Underwent HM-II placement and TV repair on 2/9.   1 unit PRBCs 2/12.   Respiratory cultures with Pseudomonas, meropenem started.  Acinetobacter also isolated, discussed with ID and recommended not treating this since extubated.    Yesterday milrinone was cut back to 0.125 mcg and diuresed with IV lasix + metolazone. Brisk diuresis noted. Weight down 2 pounds. Todays CO-OX is 74%.   Mild dyspnea with exertion.  Denies pain.   LVAD parameters:   Speed 9000  Flow 5.4 PI 5.9 Power 6  He had 1 external power alarm.     Objective:   Weight Range:  Vital Signs:   Temp:  [98 F (36.7 C)-98.4 F (36.9 C)] 98.3 F (36.8 C) (02/21 0403) Pulse Rate:  [72-82] 82 (02/21 0403) Resp:  [18-22] 20 (02/21 0403) BP: (92)/(71) 92/71 (02/21 0752) SpO2:  [97 %-98 %] 98 % (02/21 0403) Weight:  [195 lb 6.4 oz (88.6 kg)] 195 lb 6.4 oz (88.6 kg) (02/21 0403) Last BM Date: 12/24/16  Weight change: Filed Weights   12/23/16 0600 12/24/16 0500 12/25/16 0403  Weight: 196 lb 6.9 oz (89.1 kg) 197 lb 1.6 oz (89.4 kg) 195 lb 6.4 oz (88.6 kg)    Intake/Output:   Intake/Output Summary (Last 24 hours) at 12/25/16 0851 Last data filed at 12/25/16 0403  Gross per 24 hour  Intake              240 ml  Output             2775 ml  Net            -2535 ml     Physical Exam: General:  NAD. Sitting on the side of the bed.   HEENT: normal   Neck: supple, JVP ~10.  Cor: Sternal dressing in place. +CTs. LVAD hum  Lungs:  Decreased in the bases  Abdomen: obese soft, NT. Normal BS. Driveline site ok Extremities: no cyanosis, clubbing, rash.  2+ edema lower leg L>R.  Neuro: Alert/oriented.    Telemetry: Reviewed,  Atrial fibrillation, rate 80s    Labs: Basic Metabolic Panel:  Recent Labs Lab 12/19/16 0400  12/20/16 0411 12/21/16 0258 12/22/16 0401 12/23/16 0505 12/24/16 0408 12/25/16 0442  NA 131*  < > 130* 132* 128* 129* 132* 130*  K 3.7  < > 3.9 3.6 3.4* 3.5 3.7 3.7  CL 92*  < > 91* 93* 88* 87* 87* 85*  CO2 29  --  28 30 31  34* 36* 35*  GLUCOSE 148*  < > 174* 162* 158* 169* 149* 198*  BUN 54*  < > 59* 67* 66* 65* 62* 53*  CREATININE 2.24*  < > 2.32* 2.38* 2.12* 1.85* 1.64* 1.50*  CALCIUM 8.5*  --  8.8* 8.7* 8.6* 8.5* 8.5* 8.4*  MG 2.0  --  2.0  --   --   --   --   --   < > = values in this interval not displayed.  Liver Function Tests:  Recent Labs Lab 12/21/16 0258 12/22/16 0401 12/23/16 0505 12/24/16 0408 12/25/16 0442  AST 55* 60* 50* 59* 44*  ALT 48 51 45 49 40  ALKPHOS 133* 140* 135* 149* 136*  BILITOT  1.6* 1.7* 1.5* 1.5* 1.0  PROT 5.8* 5.7* 5.7* 5.9* 5.8*  ALBUMIN 2.7* 2.5* 2.5* 2.6* 2.6*   No results for input(s): LIPASE, AMYLASE in the last 168 hours. No results for input(s): AMMONIA in the last 168 hours.  CBC:  Recent Labs Lab 12/19/16 0400  12/20/16 0411 12/21/16 0258 12/22/16 0401 12/23/16 0505 12/24/16 0408 12/25/16 0442  WBC 13.2*  --  16.6* 16.0* 14.5* 13.4* 13.2* 13.3*  NEUTROABS 9.9*  --  12.9*  --   --   --   --   --   HGB 8.9*  < > 8.6* 8.3* 8.3* 8.3* 8.0* 8.2*  HCT 26.6*  < > 25.9* 24.9* 24.8* 25.1* 24.4* 25.6*  MCV 90.8  --  90.9 91.2 91.5 90.9 91.0 91.8  PLT 124*  --  149* 180 181 209 188 179  < > = values in this interval not displayed.  Cardiac Enzymes: No results for input(s): CKTOTAL, CKMB, CKMBINDEX, TROPONINI in the last 168 hours.  BNP: BNP (last 3 results)  Recent Labs  11/28/16 1104 12/14/16 0348  BNP 1,825.3* 427.7*    ProBNP (last 3 results)  Recent Labs  11/27/16 1207  PROBNP 1,746.0*      Other results:  Imaging: No results found.   Medications:     Scheduled Medications: . arformoterol  15 mcg Nebulization BID  . aspirin   81 mg Oral Daily  . bisacodyl  10 mg Oral Daily   Or  . bisacodyl  10 mg Rectal Daily  . budesonide (PULMICORT) nebulizer solution  0.5 mg Nebulization BID  . busPIRone  5 mg Oral BID  . Chlorhexidine Gluconate Cloth  6 each Topical Daily  . docusate sodium  200 mg Oral Daily  . feeding supplement (GLUCERNA SHAKE)  237 mL Oral BID BM  . ferrous Q000111Q C-folic acid  1 capsule Oral BID  . fluticasone  1 spray Each Nare Daily  . furosemide  80 mg Intravenous BID  . gabapentin  300 mg Oral Daily  . guaiFENesin  600 mg Oral BID  . insulin aspart  0-15 Units Subcutaneous TID WC  . insulin aspart  0-5 Units Subcutaneous QHS  . levothyroxine  25 mcg Oral QAC breakfast  . mouth rinse  15 mL Mouth Rinse BID  . pantoprazole  40 mg Oral Q1200  . potassium chloride  40 mEq Oral BID  . sildenafil  20 mg Oral TID  . sodium chloride flush  10-40 mL Intracatheter Q12H  . sorbitol  30 mL Oral Once  . umeclidinium bromide  1 puff Inhalation Daily  . Warfarin - Pharmacist Dosing Inpatient   Does not apply q1800    Infusions: . sodium chloride    . sodium chloride 20 mL/hr at 12/23/16 0700  . heparin 1,150 Units/hr (12/25/16 0807)  . milrinone 0.125 mcg/kg/min (12/25/16 0811)    PRN Medications: Place/Maintain arterial line **AND** sodium chloride, acetaminophen, levalbuterol, ondansetron (ZOFRAN) IV, oxyCODONE, sodium chloride flush, traMADol, traZODone   Assessment/Plan/Discussion    1. Acute on chronic systolic CHF: Ischemic cardiomyopathy but with prominent RV dysfunction on echo and exam.  Med[employer]onic ICD.  Echo this admission showed EF 20-25% with moderately dilated LV and moderately dilated/moderately dysfunctional RV with severe TR.  He was admitted with low output biventricular failure, cardiorenal syndrome, and marked volume overload. Failed dual inotrope support. IABP placed 2/7. HM-II and TV repair performed 2/9 after discussion with [facility] transplant team. POD #3 HM-II and TV  repair.  Extubated  2/11, off NO, vasopressin, and epinephrine. Speed decreased to 8800 rpm after ramp echo on 2/16 for RV support but increased back to 9000 to help get him off norepinephrine.  Now off norepinephrine. Remains on milrinone 0.125 mcg.  He is still volume overloaded on exam. - Co-ox 74%--> Stop milrinone.   - Continue LVAD speed at 9000 - Continue Lasix 80 mg twice a day. Will give a dose of metolazone again today.  Renal function stable.  - Sildenafil begun at 20 mg tid for RV failure.   - Continue warfarin, ASA 81 daily.  INR pending. On heparin.  - On asa 81 mg daily  2. CAD: s/p CABG.  No signs/sx of ischemia.  3. AKI on CKD stage III: Suspect cardiorenal syndrome in setting of low output failure/RV failure.  Creatinine trending down 1.8>1.6>1.5 .   4. Atrial fibrillation: Chronic.  Rate is not elevated.  5. Post-op anemia: Also component of anemia of renal disease/chronic disease.  He got 1 unit PRBCs 2/12, appropriate increase in hgb.  Today's Hgb 8.2. Will need transfusion if drops lower.  6. Acute liver failure, post-op: Bilirubin improved. Likely hypoperfusion. Holding hepato-toxic agents.  7. Aortic stenosis s/p TAVR: Stable on echo.  8. ID: Afebrile.  WBCs unchanged 13.3.  Pseudomonas in respiratory culture, suspect tracheobronchitis.  Covering with meropenem. Acinetobacter also noted, discussed with ID and not treating at this time.  9. Thrombocytopenia: Platelets back to normal.  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  10. Hyponatremia: Hypervolemic hyponatremia.  Sodium 130 today. Fluid restrict.  11. Pocket site pain: Better with gabapentin.   Continue LVAD education. Darrick Grinder, NP  12/25/2016, 8:51 AM  Advanced Heart Failure Team Pager [phone] (M-F; 7a - 4p)  Please contact [facility] for night-coverage after hours (4p -7a ) and weekends on [email or web address]  Patient seen with NP, agree with the above note.  Diuresed well yesterday, creatinine lower.  Walking with  cardiac rehab.  Good co-ox.  - Stop milrinone today.  - IV Lasix + metolazone again today with ongoing volume overload.  - Need INR, can stop heparin gtt if INR > 2.   Loralie Champagne 12/25/2016 9:42 AM

## 2016-12-25 NOTE — Progress Notes (Signed)
12 Days Post-Op Procedure(s) (LRB): INSERTION OF IMPLANTABLE LEFT VENTRICULAR ASSIST DEVICE (N/A) TRICUSPID VALVE REPAIR WITH EDWARDS MC 3 TRICUSPID ANNULOPLASTY RING MODEL 4900 SIZE T 28 (N/A) TRANSESOPHAGEAL ECHOCARDIOGRAM (TEE) (N/A) Subjective: Patient progressing very well 12 days postop redo sternotomy for HeartMate 2 VAD implant-destination therapy His chronic atrial fibrillation is well-controlled INR remains subtherapeutic and he is on heparin bridge  when the heparin stops he will need to have his epicardial pacing wires removed [name] chest tube sutures should remain intact at discharge and be removed at his first clinic visit. Patient's hemodynamics are excellent and it appears he will be weaned off milrinone prior to discharge  Objective: Vital signs in last 24 hours: Temp:  [97.7 F (36.5 C)-98.4 F (36.9 C)] 97.7 F (36.5 C) (02/21 1331) Pulse Rate:  [72-82] 78 (02/21 1331) Cardiac Rhythm: Atrial fibrillation;Bundle branch block (02/21 0706) Resp:  [18-22] 20 (02/21 0403) BP: (92)/(71) 92/71 (02/21 0752) SpO2:  [94 %-98 %] 94 % (02/21 1331) Weight:  [195 lb 6.4 oz (88.6 kg)] 195 lb 6.4 oz (88.6 kg) (02/21 0403)  Hemodynamic parameters for last 24 hours:  stable  Intake/Output from previous day: 02/20 0701 - 02/21 0700 In: 480 [P.O.:480] Out: 3175 [Urine:3175] Intake/Output this shift: Total I/O In: 480 [P.O.:480] Out: 1576 [Urine:1575; Stool:1]       Exam    General- alert and comfortable.   Surgical incisions, sternotomy clean and dry, chest tube sutures remain intact   Lungs- clear without rales, wheezes   Cor- atrial fibrillation, normal VAD humming noise   Abdomen- soft, non-tender   Extremities - warm, non-tender, minimal edema   Neuro- oriented, appropriate, no focal weakness   Lab Results:  Recent Labs  12/24/16 0408 12/25/16 0442  WBC 13.2* 13.3*  HGB 8.0* 8.2*  HCT 24.4* 25.6*  PLT 188 179   BMET:  Recent Labs   12/24/16 0408 12/25/16 0442  NA 132* 130*  K 3.7 3.7  CL 87* 85*  CO2 36* 35*  GLUCOSE 149* 198*  BUN 62* 53*  CREATININE 1.64* 1.50*  CALCIUM 8.5* 8.4*    PT/INR:  Recent Labs  12/25/16 0920  LABPROT 17.9*  INR 1.47   ABG    Component Value Date/Time   PHART 7.357 12/17/2016 0415   HCO3 27.2 12/17/2016 0415   TCO2 29 12/19/2016 1458   O2SAT 74.4 12/25/2016 0458   CBG (last 3)   Recent Labs  12/25/16 0619 12/25/16 1140 12/25/16 1620  GLUCAP 173* 153* 207*    Assessment/Plan: S/P Procedure(s) (LRB): INSERTION OF IMPLANTABLE LEFT VENTRICULAR ASSIST DEVICE (N/A) TRICUSPID VALVE REPAIR WITH EDWARDS MC 3 TRICUSPID ANNULOPLASTY RING MODEL 4900 SIZE T 28 (N/A) TRANSESOPHAGEAL ECHOCARDIOGRAM (TEE) (N/A) Making excellent progress We'll wean off nasal cannula oxygen Optimize INR and stop heparin then removed epicardial pacing wires Plan on discharge probably Monday, February 26   LOS: 27 days    Tharon Aquas Trigt III 12/25/2016

## 2016-12-25 NOTE — Progress Notes (Signed)
CARDIAC REHAB PHASE I   PRE:  Rate/Rhythm: 80 a fib  BP:  Sitting: 82 MAP        SaO2: 93 RA  MODE:  Ambulation: 150 ft   POST:  Rate/Rhythm: 99 a fib  BP:  Sitting: 106 MAP         SaO2: 92 RA  Pt on batteries, just returned from xray, agreeable to walk. Pt ambulated 150 ft on RA, IV, rolling walker, assist x2 (for equipment/safety), steady gait, tolerated well with no complaints. Unable to obtain O2 sat reading in hallway, pt denies shortness of breath, sats 92% on RA upon return to room. MAP somewhat elevated. Pt able to return to wall power source within minimal assistance, pt did accidentally connect black cable to white cable, however, he was able to correct his mistake with verbal cues. Encouraged IS, additional ambulation today. Pt to recliner after walk, feet elevated, call bell within reach. Will follow. Can be x1.    [id number] Lenna Sciara, RN, BSN 12/25/2016 11:33 AM  80 a fib

## 2016-12-25 NOTE — Progress Notes (Signed)
PT Cancellation Note  Patient Details Name: Trevor [name] MRN: [id number] DOB: Apr 16, 1946   Cancelled Treatment:    Reason Eval/Treat Not Completed: Patient declined, no reason specified.  Eating, then want's dressing change.  Will check back tomorrow as able. 12/25/2016  Donnella Sham, PT [phone]  (pager)   Trevor [name] 12/25/2016, 5:01 PM

## 2016-12-26 LAB — GLUCOSE, CAPILLARY
GLUCOSE-CAPILLARY: 188 mg/dL — AB (ref 65–99)
Glucose-Capillary: 128 mg/dL — ABNORMAL HIGH (ref 65–99)
Glucose-Capillary: 156 mg/dL — ABNORMAL HIGH (ref 65–99)
Glucose-Capillary: 174 mg/dL — ABNORMAL HIGH (ref 65–99)

## 2016-12-26 LAB — BASIC METABOLIC PANEL
ANION GAP: 12 (ref 5–15)
BUN: 46 mg/dL — AB (ref 6–20)
CHLORIDE: 83 mmol/L — AB (ref 101–111)
CO2: 35 mmol/L — ABNORMAL HIGH (ref 22–32)
Calcium: 8.6 mg/dL — ABNORMAL LOW (ref 8.9–10.3)
Creatinine, Ser: 1.47 mg/dL — ABNORMAL HIGH (ref 0.61–1.24)
GFR calc Af Amer: 54 mL/min — ABNORMAL LOW (ref >60)
GFR calc non Af Amer: 46 mL/min — ABNORMAL LOW (ref >60)
GLUCOSE: 189 mg/dL — AB (ref 65–99)
Potassium: 4 mmol/L (ref 3.5–5.1)
Sodium: 130 mmol/L — ABNORMAL LOW (ref 135–145)

## 2016-12-26 LAB — CBC
HCT: 25.6 % — ABNORMAL LOW (ref 39.0–52.0)
Hemoglobin: 8.3 g/dL — ABNORMAL LOW (ref 13.0–17.0)
MCH: 30 pg (ref 26.0–34.0)
MCHC: 32.4 g/dL (ref 30.0–36.0)
MCV: 92.4 fL (ref 78.0–100.0)
PLATELETS: 178 10*3/uL (ref 150–400)
RBC: 2.77 MIL/uL — AB (ref 4.22–5.81)
RDW: 17.2 % — AB (ref 11.5–15.5)
WBC: 13.6 10*3/uL — ABNORMAL HIGH (ref 4.0–10.5)

## 2016-12-26 LAB — COOXEMETRY PANEL
Carboxyhemoglobin: 1.6 % — ABNORMAL HIGH (ref 0.5–1.5)
Methemoglobin: 1.3 % (ref 0.0–1.5)
O2 Saturation: 71.3 %
Total hemoglobin: 9.6 g/dL — ABNORMAL LOW (ref 12.0–16.0)

## 2016-12-26 LAB — HEPARIN LEVEL (UNFRACTIONATED): Heparin Unfractionated: 0.4 IU/mL (ref 0.30–0.70)

## 2016-12-26 LAB — LACTATE DEHYDROGENASE: LDH: 369 U/L — ABNORMAL HIGH (ref 98–192)

## 2016-12-26 LAB — PROTIME-INR
INR: 1.4
Prothrombin Time: 17.2 seconds — ABNORMAL HIGH (ref 11.4–15.2)

## 2016-12-26 MED ORDER — WARFARIN SODIUM 7.5 MG PO TABS
7.5000 mg | ORAL_TABLET | Freq: Once | ORAL | Status: AC
  Administered 2016-12-26: 7.5 mg via ORAL
  Filled 2016-12-26: qty 1

## 2016-12-26 MED ORDER — METOLAZONE 2.5 MG PO TABS
2.5000 mg | ORAL_TABLET | Freq: Once | ORAL | Status: AC
  Administered 2016-12-26: 2.5 mg via ORAL
  Filled 2016-12-26: qty 1

## 2016-12-26 NOTE — Progress Notes (Signed)
Patient ID: Trevor [name], male   DOB: [birth year], 71 y.o.   MRN: [id number]    Advanced Heart Failure Rounding Note   Subjective:    Admitted with marked volume overload and low output HF.  Echo EF 20-25%. Failed dual inotropes. IABP placed 2/7.   Underwent HM-II placement and TV repair on 2/9.   1 unit PRBCs 2/12.   Respiratory cultures with Pseudomonas, meropenem started.  Acinetobacter also isolated, discussed with ID and recommended not treating this since extubated.    Yesterday milrinone was stopped. Continue IV lasix + metolazone. Brisk diuresis noted. Weight down another 3 pounds. Todays CO-OX is 71%.   Denies SOB.    LVAD parameters:   Speed 9000  Flow 5 PI 6.3 Power 5  NO PI events in the last 24 hours.   Objective:   Weight Range:  Vital Signs:   Temp:  [97.7 F (36.5 C)-98.8 F (37.1 C)] 98.8 F (37.1 C) (02/22 0400) Pulse Rate:  [72-88] 88 (02/22 0808) Resp:  [18] 18 (02/22 0400) BP: (84-102)/(61-67) 102/66 (02/22 0400) SpO2:  [94 %-100 %] 94 % (02/22 0808) Weight:  [192 lb 3.2 oz (87.2 kg)] 192 lb 3.2 oz (87.2 kg) (02/22 0417) Last BM Date: 12/24/16  Weight change: Filed Weights   12/24/16 0500 12/25/16 0403 12/26/16 0417  Weight: 197 lb 1.6 oz (89.4 kg) 195 lb 6.4 oz (88.6 kg) 192 lb 3.2 oz (87.2 kg)    Intake/Output:   Intake/Output Summary (Last 24 hours) at 12/26/16 0816 Last data filed at 12/26/16 0645  Gross per 24 hour  Intake              480 ml  Output             3776 ml  Net            -3296 ml     Physical Exam: General:  NAD. Sitting in the chair.    HEENT: normal   Neck: supple, JVP 7-8.  Cor: Sternal dressing in place. +CTs. LVAD hum  Lungs:  Decreased in the bases  Abdomen: obese soft, NT. Normal BS. Driveline site ok Extremities: no cyanosis, clubbing, rash.  1+ edema lower leg L>R.  Neuro: Alert/oriented.    Telemetry: Reviewed,  Atrial fibrillation, rate 80s   Labs: Basic Metabolic Panel:  Recent Labs Lab  12/20/16 0411 12/21/16 0258 12/22/16 0401 12/23/16 0505 12/24/16 0408 12/25/16 0442  NA 130* 132* 128* 129* 132* 130*  K 3.9 3.6 3.4* 3.5 3.7 3.7  CL 91* 93* 88* 87* 87* 85*  CO2 28 30 31  34* 36* 35*  GLUCOSE 174* 162* 158* 169* 149* 198*  BUN 59* 67* 66* 65* 62* 53*  CREATININE 2.32* 2.38* 2.12* 1.85* 1.64* 1.50*  CALCIUM 8.8* 8.7* 8.6* 8.5* 8.5* 8.4*  MG 2.0  --   --   --   --   --     Liver Function Tests:  Recent Labs Lab 12/21/16 0258 12/22/16 0401 12/23/16 0505 12/24/16 0408 12/25/16 0442  AST 55* 60* 50* 59* 44*  ALT 48 51 45 49 40  ALKPHOS 133* 140* 135* 149* 136*  BILITOT 1.6* 1.7* 1.5* 1.5* 1.0  PROT 5.8* 5.7* 5.7* 5.9* 5.8*  ALBUMIN 2.7* 2.5* 2.5* 2.6* 2.6*   No results for input(s): LIPASE, AMYLASE in the last 168 hours. No results for input(s): AMMONIA in the last 168 hours.  CBC:  Recent Labs Lab 12/20/16 0411  12/22/16 0401 12/23/16 0505 12/24/16 0408 12/25/16  [id number] 12/26/16 0441  WBC 16.6*  < > 14.5* 13.4* 13.2* 13.3* 13.6*  NEUTROABS 12.9*  --   --   --   --   --   --   HGB 8.6*  < > 8.3* 8.3* 8.0* 8.2* 8.3*  HCT 25.9*  < > 24.8* 25.1* 24.4* 25.6* 25.6*  MCV 90.9  < > 91.5 90.9 91.0 91.8 92.4  PLT 149*  < > 181 209 188 179 178  < > = values in this interval not displayed.  Cardiac Enzymes: No results for input(s): CKTOTAL, CKMB, CKMBINDEX, TROPONINI in the last 168 hours.  BNP: BNP (last 3 results)  Recent Labs  11/28/16 1104 12/14/16 0348  BNP 1,825.3* 427.7*    ProBNP (last 3 results)  Recent Labs  11/27/16 1207  PROBNP 1,746.0*      Other results:  Imaging: Dg Chest 2 View  Result Date: 12/25/2016 CLINICAL DATA:  Left ventricular assist device, some shortness of breath, some left-sided chest pain EXAM: CHEST  2 VIEW COMPARISON:  Portable chest x-ray of 12/23/2016 FINDINGS: Aeration the lungs has improved with some improvement in pulmonary vascular congestion. Small pleural effusions remain with mild basilar  atelectasis. Opacity in the left mid lung probably represents atelectasis and some fluid in the fissure as well. Cardiomegaly is stable. AICD leads remain. LVAD remains. Heart size is stable with prosthetic cardiac valve present by history representing a tricuspid valve replacement. Left central venous line is unchanged in position. IMPRESSION: 1. Improved aeration with improvement in pulmonary vascular congestion. 2. Small bilateral pleural effusions are present with mild bibasilar volume loss. 3. LVAD and AICD leads remain. Electronically Signed   By: Ivar Drape M.D.   On: 12/25/2016 10:55     Medications:     Scheduled Medications: . arformoterol  15 mcg Nebulization BID  . aspirin  81 mg Oral Daily  . bisacodyl  10 mg Oral Daily   Or  . bisacodyl  10 mg Rectal Daily  . budesonide (PULMICORT) nebulizer solution  0.5 mg Nebulization BID  . busPIRone  5 mg Oral BID  . Chlorhexidine Gluconate Cloth  6 each Topical Daily  . docusate sodium  200 mg Oral Daily  . feeding supplement (GLUCERNA SHAKE)  237 mL Oral BID BM  . ferrous Q000111Q C-folic acid  1 capsule Oral BID  . fluticasone  1 spray Each Nare Daily  . furosemide  80 mg Intravenous BID  . gabapentin  300 mg Oral Daily  . guaiFENesin  600 mg Oral BID  . insulin aspart  0-15 Units Subcutaneous TID WC  . insulin aspart  0-5 Units Subcutaneous QHS  . levothyroxine  25 mcg Oral QAC breakfast  . mouth rinse  15 mL Mouth Rinse BID  . pantoprazole  40 mg Oral Q1200  . potassium chloride  40 mEq Oral BID  . sildenafil  20 mg Oral TID  . sodium chloride flush  10-40 mL Intracatheter Q12H  . sorbitol  30 mL Oral Once  . umeclidinium bromide  1 puff Inhalation Daily  . Warfarin - Pharmacist Dosing Inpatient   Does not apply q1800    Infusions: . sodium chloride    . sodium chloride 20 mL/hr at 12/23/16 0700  . heparin 1,200 Units/hr (12/26/16 0305)    PRN Medications: Place/Maintain arterial line **AND** sodium  chloride, acetaminophen, levalbuterol, ondansetron (ZOFRAN) IV, oxyCODONE, sodium chloride flush, traMADol, traZODone   Assessment/Plan/Discussion    1. Acute on chronic systolic CHF: Ischemic cardiomyopathy  but with prominent RV dysfunction on echo and exam.  Medtronic ICD.  Echo this admission showed EF 20-25% with moderately dilated LV and moderately dilated/moderately dysfunctional RV with severe TR.  He was admitted with low output biventricular failure, cardiorenal syndrome, and marked volume overload. Failed dual inotrope support. IABP placed 2/7. HM-II and TV repair performed 2/9 after discussion with [facility] transplant team. POD #3 HM-II and TV repair.  Extubated 2/11, off NO, vasopressin, and epinephrine. Speed decreased to 8800 rpm after ramp echo on 2/16 for RV support but increased back to 9000 to help get him off norepinephrine.  Now off norepinephrine. Remains on milrinone 0.125 mcg.  He is still volume overloaded on exam. - Co-ox 71% off milrinone.    - Continue LVAD speed at 9000 - Continue Lasix 80 mg twice a day. Renal function pending.  - Sildenafil begun at 20 mg tid for RV failure.   - Continue warfarin, ASA 81 daily.  INR pending. On heparin.  - On asa 81 mg daily  2. CAD: s/p CABG.  No signs/sx of ischemia.  3. AKI on CKD stage III: Suspect cardiorenal syndrome in setting of low output failure/RV failure.  Creatinine pending.    4. Atrial fibrillation: Chronic.  Rate is not elevated.  5. Post-op anemia: Also component of anemia of renal disease/chronic disease.  He got 1 unit PRBCs 2/12, appropriate increase in hgb.  Today's Hgb 8.3. Will need transfusion if drops lower.  6. Acute liver failure, post-op: Bilirubin improved. Likely hypoperfusion. Holding hepato-toxic agents.  7. Aortic stenosis s/p TAVR: Stable on echo.  8. ID: Afebrile.  WBCs unchanged 13.6.  Pseudomonas in respiratory culture, suspect tracheobronchitis.  Covering with meropenem. Acinetobacter also noted,  discussed with ID and not treating at this time.  9. Thrombocytopenia: Platelets back to normal.  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  10. Hyponatremia: BMET pending.   11. Pocket site pain: Better with gabapentin.   HH order placed for RN/PT.  Continue LVAD education. Darrick Grinder, NP  12/26/2016, 8:16 AM  Advanced Heart Failure Team Pager [phone] (M-F; 7a - 4p)  Please contact [facility] for night-coverage after hours (4p -7a ) and weekends on [email or web address]  Patient seen with NP, agree with the above note. Still volume overloaded on exam but diuresing.  Will give metolazone dose again this afternoon with pm Lasix.  Good co-ox off milrinone.  Walking in halls.  Stop heparin gtt when INR > 2 (waiting for INR this morning).  Needs to be off heparin to remove pacing wires.   Loralie Champagne 12/26/2016 9:17 AM

## 2016-12-26 NOTE — Progress Notes (Signed)
Went to patient's room to observe dressing change per family - no family present and patient asleep. Will proceed with scheduled education day tomorrow Friday 12/27/16 @ 11:30  [name], RN VAD Coordinator   Office: [phone] 24/7 Emergency VAD Pager: [phone]

## 2016-12-26 NOTE — Progress Notes (Signed)
ANTICOAGULATION CONSULT NOTE - Follow Up Consult  Pharmacy Consult for heparin while INR subtherapeutic Indication: atrial fibrillation/LVAD  Allergies  Allergen Reactions  . No Known Allergies     Patient Measurements: Height: 5\' 3"  (160 cm) Weight: 192 lb 3.2 oz (87.2 kg) IBW/kg (Calculated) : 56.9  Heparin dosing weight: 76kg  Vital Signs: Temp: 98.8 F (37.1 C) (02/22 0400) Temp Source: Oral (02/22 0400) BP: 102/66 (02/22 0400) Pulse Rate: 88 (02/22 0808)  Labs:  Recent Labs  12/24/16 0408  12/25/16 0018 12/25/16 0442 12/25/16 0920 12/26/16 0441 12/26/16 0851 12/26/16 0940  HGB 8.0*  --   --  8.2*  --  8.3*  --   --   HCT 24.4*  --   --  25.6*  --  25.6*  --   --   PLT 188  --   --  179  --  178  --   --   LABPROT 18.3*  --   --   --  17.9*  --   --  17.2*  INR 1.50  --   --   --  1.47  --   --  1.40  HEPARINUNFRC  --   < > 0.19*  --  0.20*  --   --  0.40  CREATININE 1.64*  --   --  1.50*  --   --  1.47*  --   < > = values in this interval not displayed.  Estimated Creatinine Clearance: 45 mL/min (by C-G formula based on SCr of 1.47 mg/dL (H)).   Assessment: 70yom on apixaban pta for afib (last dose 1/31 @ 2110), transitioned to IV heparin for LVAD placement (2/9).   Patient now POD #12 on warfarin.  Bivalirudin off as negative SRA.  Heparin level up to 0.40 (aiming for low goal of 0.2-0.3) on gtt at 1150 units/hr. No bleeding noted. CBC stable with platelets at 178.   INR still low at 1.4.   Goal of Therapy:  INR 2-2.5 Goal Heparin level 0.2-0.3 Monitor platelets by anticoagulation protocol: Yes   Plan:  -Decrease Heparin slightly to 1150 units/hr - aiming for low narrow goal -Will try to minimize lab draws per MD request- next heparin level with a.m. Labs -Repeat Warfarin 7.5mg  tonight to try and expedite getting off heparin  Erin Hearing PharmD., BCPS Clinical Pharmacist Pager [phone] 12/26/2016 10:22 AM

## 2016-12-26 NOTE — Care Management Note (Signed)
Case Management Note Previous CM note initiated by [name], RN 12/24/2016, 8:46 AM   Patient Details  Name: Trevor [name] MRN: [id number] Date of Birth: 1946/06/08  Subjective/Objective:   S/p LVAD placement 2/9                 Action/Plan:  PTA independent from home with sister.  LVAD team following closely.  Pt has recommendations for HH - no orders as of yet.  CM will continue to follow for discharge needs   Expected Discharge Date:      12/30/16            Expected Discharge Plan:  [place]  In-House Referral:  Clinical Social Work  Discharge planning Services  CM Consult  Post Acute Care Choice:  Home Health Choice offered to:  Patient, Sibling  DME Arranged:    DME Agency:     HH Arranged:  RN, Disease Management [place] Agency:  [place]  Status of Service:  Completed, signed off  If discussed at [place] of Stay Meetings, dates discussed:  2.20, 2/22  Discharge Disposition: home/home health   Additional Comments:  12/26/16- 1040- Steffanie Dunn Aimee Timmons RN, CM- pt is off IV milrinone - plan is to d/c home on Monday- 2/26- pt will d/c home with sister- d/c teaching being done with sister for LVAD needs. Pt remains on IV heparin until INR>2, IV lasix still being given. HHRN-HF orders have been placed- [facility] has been chosen by pt previously - and referral placed- spoke with [name] at [facility] regarding [place] orders for discharge,- [facility] is following for home needs and [place] orders.   12/24/16- [name] RN, CM- pt is not LTACH appropriate at this time- has tx out of ICU to 2W and is off of pressors- remains on IV milrinone-plans to wean for d/c.   [name], RN 12/24/2016, 8:46 AM-Discussed in LOS 12/24/16 - pt remains appropriate for continued stay.  Pt had to be restarted on a pressor while asleep due to drop in BP per HF team note.  Per Physician Advisor- if pt continues to need norepi on an intermittent basis pt may be appropriate  for [facility] referral.  Pt has transferred to [place] will continue to monitor for discharge planning and appropriate disposition.    12/19/16 CM spoke in depth with pt - pt alert and oriented - remains on Milrinone and IV lasix.  Discussed in LOS 12/19/16 - pt remains appropriate for continued stay.  Pt confirmed he will discharge home with his sister - sister will provided around the clock supervision at discharge.   CM spoke with pt about [place] recommended - pt had no preference with HH/DME agency- pt is in agreement with [facility] (LVAD agency of choice).  CM will arrange [facility] and DME when orders are written - agency is aware of tentative referral.   CM will continue to follow for discharge needs   [name], RN 12/26/2016, 10:40 AM [phone]  12/26/16- 1040- Addyson Traub RN, CM- pt is off IV milrinone - plan is to d/c home on Monday- 2/26- pt will d/c home with sister- d/c teaching being done with sister for LVAD needs. Pt remains on IV heparin until INR>2, IV lasix still being given. HHRN-HF orders have been placed- [facility] has been chosen by pt previously - and referral placed- spoke with [name] at [facility] regarding [place] orders for discharge,- [facility] is following for home needs and  [place] orders.

## 2016-12-26 NOTE — Evaluation (Signed)
Occupational Therapy Evaluation Patient Details Name: Javaun Deeter MRN: [id number] DOB: 05-07-1946 Today's Date: 12/26/2016    History of Present Illness  71 y.o.male with ischemic cardiomyopathy, severe tricuspid regurgitation, ejection fraction 20% and chronic renal insufficiency, CHF, CKD, DM2, asthma. The patient was in cardiogenic shock with severe volume overload and deterioration in renal function on admission s/p LVAD Heartmate II implant 2/9    Clinical Impression   Pt performed ambulation in his room, toileting and 2 grooming activities in standing with min guard to supervision. Reinforce energy conservation strategies and generalizing sternal precautions at home during IADL. Pt is eager to go home.    Follow Up Recommendations  No OT follow up    Equipment Recommendations  None recommended by OT    Recommendations for Other Services       Precautions / Restrictions Precautions Precautions: Sternal;Fall Precaution Comments: LVAD Restrictions Weight Bearing Restrictions: Yes (sternal precautions) Other Position/Activity Restrictions: sternal precautions      Mobility Bed Mobility               General bed mobility comments: pt in chair  Transfers Overall transfer level: Needs assistance   Transfers: Sit to/from Stand Sit to Stand: Supervision         General transfer comment: from toilet and chair    Balance     Sitting balance-[name]: Good       Standing balance-[name]: Good                              ADL Overall ADL's : Needs assistance/impaired     Grooming: Wash/dry hands;Oral care;Standing;Supervision/safety               Lower Body Dressing: Min guard;Sit to/from stand Lower Body Dressing Details (indicate cue type and reason): pt without awareness that is boxers were around his feet, min guard to pull up Toilet Transfer: Min guard;Ambulation;Regular Toilet   Toileting- Water quality scientist  and Hygiene: Supervision/safety;Sit to/from stand       Functional mobility during ADLs: Min guard (pt pushed IV pole) General ADL Comments: Reinforced energy conservation strategies, pt not able to state techniques.     Vision         Perception     Praxis      Pertinent Vitals/Pain Pain Assessment: No/denies pain     Hand Dominance     Extremity/Trunk Assessment             Communication     Cognition Arousal/Alertness: Awake/alert Behavior During Therapy: WFL for tasks assessed/performed Overall Cognitive Status: Impaired/Different from baseline                 General Comments: able to recall all sternal precaution, min VCs for power exchange   General Comments  instructed pt to avoid pulling handle of his own recliner due to sternal precautions    Exercises       Shoulder Instructions      Home Living                                          Prior Functioning/Environment                   OT Problem List:        OT Treatment/Interventions:  OT Goals(Current goals can be found in the care plan section) Acute Rehab OT Goals Patient Stated Goal: to go home ASAP Time For Goal Achievement: 12/31/16 Potential to Achieve Goals: Good  OT Frequency: Min 3X/week   Barriers to D/C:            Co-evaluation              End of Session Equipment Utilized During Treatment:  (replaced 02 upon completion of ADL) Nurse Communication:  (pt has been using 02 on and off)  Activity Tolerance: Patient tolerated treatment well Patient left: in chair;with call bell/phone within reach  OT Visit Diagnosis: Unsteadiness on feet (R26.81);Muscle weakness (generalized) (M62.81)                ADL either performed or assessed with clinical judgement  Time: 1334-1359 OT Time Calculation (min): 25 min Charges:  OT General Charges $OT Visit: 1 Procedure OT Treatments $Self Care/Home Management : 23-37 mins G-Codes:      Malka So 12/26/2016, 2:12 PM  [phone]

## 2016-12-26 NOTE — Progress Notes (Signed)
CARDIAC REHAB PHASE I   PRE:  Rate/Rhythm: 66 afib    BP: sitting 66, recheck 64    SaO2: 98 2L  MODE:  Ambulation: 350 ft   POST:  Rate/Rhythm: 84 afib    BP: sitting 84     SaO2: 97 2L  Pt tolerated fairly well. Steady with RW and 2L (pt requested to wear today). Rest x2 briefly. C/o knee pain. Return to recliner with VSS. Donned batteries and took them off without difficulty. [place], ACSM 12/26/2016 3:49 PM

## 2016-12-26 NOTE — Progress Notes (Addendum)
Physical Therapy Treatment Patient Details Name: Trevor [name] MRN: [id number] DOB: 1946-05-28 Today's Date: 12/26/2016    History of Present Illness  71 y.o.male with ischemic cardiomyopathy, severe tricuspid regurgitation, ejection fraction 20% and chronic renal insufficiency, CHF, CKD, DM2, asthma. The patient was in cardiogenic shock with severe volume overload and deterioration in renal function on admission s/p LVAD Heartmate II implant 2/9     PT Comments    Pt moving well with improved bed mobility, gait and ability to transfer power sources. Trevor [name] continues to need min assist with power exchange to connect the correct cords and able to state all necessary equipment as well as don/doff vest without assist. Encouraged ambulation 3x/day. Pt with Afib HR 99 with gait with 2/4 dyspnea with ability to continue gait and recover with use of 1L for remainder of gait with sats 92%. Will continue to follow.  Flow 5.2-5.4, PI 5.7-6.4, power 5.3 maintained    Follow Up Recommendations  Home health PT;Supervision for mobility/OOB     Equipment Recommendations       Recommendations for Other Services       Precautions / Restrictions Precautions Precautions: Sternal;Fall Precaution Comments: LVAD    Mobility  Bed Mobility Overal bed mobility: Needs Assistance       Supine to sit: Min guard     General bed mobility comments: cues to maintain precautions throughout  Transfers Overall transfer level: Needs assistance   Transfers: Sit to/from Stand Sit to Stand: Min guard         General transfer comment: guarding for lines and safety  Ambulation/Gait Ambulation/Gait assistance: Min guard Ambulation Distance (Feet): 350 Feet Assistive device: Rolling walker (2 wheeled) Gait Pattern/deviations: Step-through pattern;Decreased stride length   Gait velocity interpretation: Below normal speed for age/gender General Gait Details: periodic standing rest breaks with  HR 88-99 AFib with gait with SOB after 150' with pt transitioned from RA to 1L with no further SOB   Stairs            Wheelchair Mobility    Modified Rankin (Stroke Patients Only)       Balance Overall balance assessment: Needs assistance   Sitting balance-[name]: Good       Standing balance-[name]: Good                      Cognition Arousal/Alertness: Awake/alert Behavior During Therapy: WFL for tasks assessed/performed Overall Cognitive Status: Within Functional Limits for tasks assessed                 General Comments: able to recall all sternal precautions; able to recall LVAD equipment needed to ambulate, min cues for power exchange    Exercises      General Comments        Pertinent Vitals/Pain Pain Assessment: No/denies pain    Home Living                      Prior Function            PT Goals (current goals can now be found in the care plan section) Acute Rehab PT Goals Time For Goal Achievement: 01/09/17 Progress towards PT goals: Goals met and updated - see care plan    Frequency           PT Plan Current plan remains appropriate    Co-evaluation             End of Session  Equipment Utilized During Treatment: Oxygen Activity Tolerance: Patient tolerated treatment well Patient left: in chair;with call bell/phone within [name] Nurse Communication: Mobility status       Time: 0726-0758 PT Time Calculation (min) (ACUTE ONLY): 32 min  Charges:  $Gait Training: 8-22 mins $Therapeutic Activity: 8-22 mins                    G Codes:       Trevor [name] B Trevor [name] 2017/01/24, 8:13 AM Trevor [name], [place]

## 2016-12-26 NOTE — Progress Notes (Signed)
Nutrition Follow Up  DOCUMENTATION CODES:   Obesity unspecified  INTERVENTION:    Continue Glucerna Shake po BID, each supplement provides 220 kcal and 10 grams of protein  NUTRITION DIAGNOSIS:   Increased nutrient needs related to  (post-op healing) as evidenced by estimated needs, ongoing   GOAL:   Patient will meet greater than or equal to 90% of their needs, met  MONITOR:   PO intake, Supplement acceptance, Labs, Weight trends, I & O's  ASSESSMENT:   71 y.o. male with medical history significant of CHF, CKD, DM2, asthma. Patient presents to the ED with c/o worsening leg edema and SOB. Admitted with marked volume overload and low output HF. LVAD workup ongoing.  Pt s/p procedures 2/9: INSERTION OF IMPLANTABLE LEFT VENTRICULAR ASSIST DEVICE (N/A) - HEARTMATE II  Pt extubated 2/11. PO intake good at 75-100% per flowsheet records. Heart Failure Rounding Note reviewed. LVAD education continues. CBG's [phone].  Diet Order:  Diet heart healthy/carb modified Room service appropriate? Yes; Fluid consistency: Thin; Fluid restriction: 1500 mL Fluid  Skin:  Reviewed, no issues  Last BM:  2/20  Height:   Ht Readings from Last 1 Encounters:  12/13/16 5' 3"  (1.6 m)    Weight:   Wt Readings from Last 1 Encounters:  12/26/16 192 lb 3.2 oz (87.2 kg)   Ideal Body Weight:  61.8 kg  BMI:  Body mass index is 34.05 kg/m.  Estimated Nutritional Needs:   Kcal:  1900-2100  Protein:  105-115 gm  Fluid:  per MD  EDUCATION NEEDS:   No education needs identified at this time  Arthur Holms, RD, LDN Pager #: [phone] After-Hours Pager #: [phone]

## 2016-12-27 LAB — URINALYSIS, ROUTINE W REFLEX MICROSCOPIC
Bilirubin Urine: NEGATIVE
GLUCOSE, UA: NEGATIVE mg/dL
HGB URINE DIPSTICK: NEGATIVE
KETONES UR: NEGATIVE mg/dL
Leukocytes, UA: NEGATIVE
Nitrite: NEGATIVE
PH: 7 (ref 5.0–8.0)
PROTEIN: NEGATIVE mg/dL
Specific Gravity, Urine: 1.011 (ref 1.005–1.030)

## 2016-12-27 LAB — LACTATE DEHYDROGENASE: LDH: 371 U/L — ABNORMAL HIGH (ref 98–192)

## 2016-12-27 LAB — PROTIME-INR
INR: 1.39
Prothrombin Time: 17.2 seconds — ABNORMAL HIGH (ref 11.4–15.2)

## 2016-12-27 LAB — GLUCOSE, CAPILLARY
GLUCOSE-CAPILLARY: 130 mg/dL — AB (ref 65–99)
GLUCOSE-CAPILLARY: 163 mg/dL — AB (ref 65–99)
GLUCOSE-CAPILLARY: 142 mg/dL — AB (ref 65–99)
Glucose-Capillary: 159 mg/dL — ABNORMAL HIGH (ref 65–99)

## 2016-12-27 LAB — BASIC METABOLIC PANEL
ANION GAP: 12 (ref 5–15)
BUN: 46 mg/dL — AB (ref 6–20)
CALCIUM: 8.8 mg/dL — AB (ref 8.9–10.3)
CO2: 37 mmol/L — ABNORMAL HIGH (ref 22–32)
CREATININE: 1.47 mg/dL — AB (ref 0.61–1.24)
Chloride: 83 mmol/L — ABNORMAL LOW (ref 101–111)
GFR calc Af Amer: 54 mL/min — ABNORMAL LOW (ref >60)
GFR, EST NON AFRICAN AMERICAN: 46 mL/min — AB (ref >60)
GLUCOSE: 150 mg/dL — AB (ref 65–99)
Potassium: 4.3 mmol/L (ref 3.5–5.1)
Sodium: 132 mmol/L — ABNORMAL LOW (ref 135–145)

## 2016-12-27 LAB — CBC
HCT: 26.3 % — ABNORMAL LOW (ref 39.0–52.0)
HCT: 25.4 % — ABNORMAL LOW (ref 39.0–52.0)
Hemoglobin: 8.5 g/dL — ABNORMAL LOW (ref 13.0–17.0)
Hemoglobin: 8.1 g/dL — ABNORMAL LOW (ref 13.0–17.0)
MCH: 30 pg (ref 26.0–34.0)
MCH: 29.8 pg (ref 26.0–34.0)
MCHC: 32.3 g/dL (ref 30.0–36.0)
MCHC: 31.9 g/dL (ref 30.0–36.0)
MCV: 92.9 fL (ref 78.0–100.0)
MCV: 93.4 fL (ref 78.0–100.0)
PLATELETS: 179 10*3/uL (ref 150–400)
PLATELETS: 166 10*3/uL (ref 150–400)
RBC: 2.83 MIL/uL — AB (ref 4.22–5.81)
RBC: 2.72 MIL/uL — ABNORMAL LOW (ref 4.22–5.81)
RDW: 17.4 % — AB (ref 11.5–15.5)
RDW: 17.4 % — AB (ref 11.5–15.5)
WBC: 18.2 10*3/uL — ABNORMAL HIGH (ref 4.0–10.5)
WBC: 19.7 10*3/uL — AB (ref 4.0–10.5)

## 2016-12-27 LAB — HEPARIN LEVEL (UNFRACTIONATED): Heparin Unfractionated: 0.3 IU/mL (ref 0.30–0.70)

## 2016-12-27 LAB — COOXEMETRY PANEL
Carboxyhemoglobin: 1.5 % (ref 0.5–1.5)
Methemoglobin: 1.1 % (ref 0.0–1.5)
O2 Saturation: 60.2 %
Total hemoglobin: 10.6 g/dL — ABNORMAL LOW (ref 12.0–16.0)

## 2016-12-27 MED ORDER — FUROSEMIDE 40 MG PO TABS: 60.0000 mg | ORAL_TABLET | Freq: Every day | ORAL | Status: DC

## 2016-12-27 MED ORDER — FUROSEMIDE 40 MG PO TABS: 60.0000 mg | ORAL_TABLET | Freq: Two times a day (BID) | ORAL | Status: DC

## 2016-12-27 MED ORDER — WARFARIN VIDEO: Freq: Once | Status: DC

## 2016-12-27 MED ORDER — FUROSEMIDE 80 MG PO TABS
80.0000 mg | ORAL_TABLET | Freq: Two times a day (BID) | ORAL | Status: DC
  Administered 2016-12-27 – 2016-12-28 (×3): 80 mg via ORAL
  Filled 2016-12-27 (×3): qty 1

## 2016-12-27 MED ORDER — WARFARIN SODIUM 10 MG PO TABS
10.0000 mg | ORAL_TABLET | Freq: Once | ORAL | Status: AC
  Administered 2016-12-27: 10 mg via ORAL
  Filled 2016-12-27: qty 1

## 2016-12-27 MED ORDER — PATIENT'S GUIDE TO USING COUMADIN BOOK
Freq: Once | Status: AC
  Administered 2016-12-27: 18:00:00
  Filled 2016-12-27: qty 1

## 2016-12-27 NOTE — Progress Notes (Signed)
Patient ID: Trevor [name], male   DOB: Jul 13, 1946, 71 y.o.   MRN: [id number]    Advanced Heart Failure Rounding Note   Subjective:    Admitted with marked volume overload and low output HF.  Echo EF 20-25%. Failed dual inotropes. IABP placed 2/7.   Underwent HM-II placement and TV repair on 2/9.   1 unit PRBCs 2/12.   Respiratory cultures with Pseudomonas, meropenem course completed.  Acinetobacter also isolated, discussed with ID and recommended not treating this since extubated.    Yesterday diuresed with IV lasix. Weight down another 3 pounds. Today's CO-OX is 60%. WBC up 19.7.   Denies SOB. Overall feeling better.   LVAD parameters:   Speed 9000  Flow 5.7PI 5.2 Power 5  NO PI events in the last 24 hours.   Objective:   Weight Range:  Vital Signs:   Temp:  [98.1 F (36.7 C)-98.4 F (36.9 C)] 98.1 F (36.7 C) (02/23 0400) Pulse Rate:  [72-87] 83 (02/23 0400) Resp:  [18] 18 (02/23 0400) BP: (80-99)/(69-79) 87/75 (02/22 2126) SpO2:  [95 %-100 %] 96 % (02/23 0749) Weight:  [189 lb 3.2 oz (85.8 kg)] 189 lb 3.2 oz (85.8 kg) (02/23 0456) Last BM Date: 12/26/16  Weight change: Filed Weights   12/25/16 0403 12/26/16 0417 12/27/16 0456  Weight: 195 lb 6.4 oz (88.6 kg) 192 lb 3.2 oz (87.2 kg) 189 lb 3.2 oz (85.8 kg)    Intake/Output:   Intake/Output Summary (Last 24 hours) at 12/27/16 0924 Last data filed at 12/27/16 [id number]  Gross per 24 hour  Intake              490 ml  Output             3250 ml  Net            -2760 ml    MAPs 80s  Physical Exam: General:  NAD. In bed.    HEENT: normal   Neck: supple, JVP 6-7.  Cor: Sternal dressing in place.  LVAD hum  Lungs:  Decreased in the bases. On room air.   Abdomen: obese soft, NT. Normal BS. Driveline site ok Extremities: no cyanosis, clubbing, rash.  Trace-1+ edema lower leg L>R.  Neuro: Alert/oriented.    Telemetry: Reviewed,  Atrial fibrillation, rate 80s   Labs: Basic Metabolic Panel:  Recent Labs Lab  12/22/16 0401 12/23/16 0505 12/24/16 0408 12/25/16 0442 12/26/16 0851  NA 128* 129* 132* 130* 130*  K 3.4* 3.5 3.7 3.7 4.0  CL 88* 87* 87* 85* 83*  CO2 31 34* 36* 35* 35*  GLUCOSE 158* 169* 149* 198* 189*  BUN 66* 65* 62* 53* 46*  CREATININE 2.12* 1.85* 1.64* 1.50* 1.47*  CALCIUM 8.6* 8.5* 8.5* 8.4* 8.6*    Liver Function Tests:  Recent Labs Lab 12/21/16 0258 12/22/16 0401 12/23/16 0505 12/24/16 0408 12/25/16 0442  AST 55* 60* 50* 59* 44*  ALT 48 51 45 49 40  ALKPHOS 133* 140* 135* 149* 136*  BILITOT 1.6* 1.7* 1.5* 1.5* 1.0  PROT 5.8* 5.7* 5.7* 5.9* 5.8*  ALBUMIN 2.7* 2.5* 2.5* 2.6* 2.6*   No results for input(s): LIPASE, AMYLASE in the last 168 hours. No results for input(s): AMMONIA in the last 168 hours.  CBC:  Recent Labs Lab 12/23/16 0505 12/24/16 0408 12/25/16 0442 12/26/16 0441 12/27/16 0515  WBC 13.4* 13.2* 13.3* 13.6* 18.2*  HGB 8.3* 8.0* 8.2* 8.3* 8.5*  HCT 25.1* 24.4* 25.6* 25.6* 26.3*  MCV 90.9 91.0 91.8  92.4 92.9  PLT 209 188 179 178 179    Cardiac Enzymes: No results for input(s): CKTOTAL, CKMB, CKMBINDEX, TROPONINI in the last 168 hours.  BNP: BNP (last 3 results)  Recent Labs  11/28/16 1104 12/14/16 0348  BNP 1,825.3* 427.7*    ProBNP (last 3 results)  Recent Labs  11/27/16 1207  PROBNP 1,746.0*      Other results:  Imaging: Dg Chest 2 View  Result Date: 12/25/2016 CLINICAL DATA:  Left ventricular assist device, some shortness of breath, some left-sided chest pain EXAM: CHEST  2 VIEW COMPARISON:  Portable chest x-ray of 12/23/2016 FINDINGS: Aeration the lungs has improved with some improvement in pulmonary vascular congestion. Small pleural effusions remain with mild basilar atelectasis. Opacity in the left mid lung probably represents atelectasis and some fluid in the fissure as well. Cardiomegaly is stable. AICD leads remain. LVAD remains. Heart size is stable with prosthetic cardiac valve present by history representing  a tricuspid valve replacement. Left central venous line is unchanged in position. IMPRESSION: 1. Improved aeration with improvement in pulmonary vascular congestion. 2. Small bilateral pleural effusions are present with mild bibasilar volume loss. 3. LVAD and AICD leads remain. Electronically Signed   By: Ivar Drape M.D.   On: 12/25/2016 10:55     Medications:     Scheduled Medications: . arformoterol  15 mcg Nebulization BID  . aspirin  81 mg Oral Daily  . bisacodyl  10 mg Oral Daily   Or  . bisacodyl  10 mg Rectal Daily  . budesonide (PULMICORT) nebulizer solution  0.5 mg Nebulization BID  . busPIRone  5 mg Oral BID  . Chlorhexidine Gluconate Cloth  6 each Topical Daily  . docusate sodium  200 mg Oral Daily  . feeding supplement (GLUCERNA SHAKE)  237 mL Oral BID BM  . ferrous [id number] C-folic acid  1 capsule Oral BID  . fluticasone  1 spray Each Nare Daily  . furosemide  80 mg Intravenous BID  . gabapentin  300 mg Oral Daily  . guaiFENesin  600 mg Oral BID  . insulin aspart  0-15 Units Subcutaneous TID WC  . insulin aspart  0-5 Units Subcutaneous QHS  . levothyroxine  25 mcg Oral QAC breakfast  . mouth rinse  15 mL Mouth Rinse BID  . pantoprazole  40 mg Oral Q1200  . potassium chloride  40 mEq Oral BID  . sildenafil  20 mg Oral TID  . sodium chloride flush  10-40 mL Intracatheter Q12H  . sorbitol  30 mL Oral Once  . umeclidinium bromide  1 puff Inhalation Daily  . Warfarin - Pharmacist Dosing Inpatient   Does not apply q1800    Infusions: . sodium chloride    . sodium chloride 20 mL/hr at 12/23/16 0700  . heparin 1,150 Units/hr (12/26/16 2303)    PRN Medications: Place/Maintain arterial line **AND** sodium chloride, acetaminophen, levalbuterol, ondansetron (ZOFRAN) IV, oxyCODONE, sodium chloride flush, traMADol, traZODone   Assessment/Plan/Discussion    1. Acute on chronic systolic CHF: Ischemic cardiomyopathy but with prominent RV dysfunction on echo  and exam.  [employer] ICD.  Echo this admission showed EF 20-25% with moderately dilated LV and moderately dilated/moderately dysfunctional RV with severe TR.  He was admitted with low output biventricular failure, cardiorenal syndrome, and marked volume overload. Failed dual inotrope support. IABP placed 2/7. HM-II and TV repair performed 2/9 after discussion with [facility] transplant team. POD #3 HM-II and TV repair.  Extubated 2/11, off NO, vasopressin, and  epinephrine. Speed decreased to 8800 rpm after ramp echo on 2/16 for RV support but increased back to 9000 to help get him off norepinephrine.  Off norepi and milrinone.  Volume status stable. Creatinine stable at 1.47.  - Stop IV lasix. Start po lasix 80 mg twice a day.    - Co-ox 60% off milrinone.    - Continue LVAD speed at 9000 - Sildenafil begun at 20 mg tid for RV failure.   - Continue warfarin, ASA 81 daily.  INR 1.4. On heparin. Will need pacing wires out when off heparin gtt.  - On asa 81 mg daily  2. CAD: s/p CABG.  No signs/sx of ischemia.  3. AKI on CKD stage III: Suspect cardiorenal syndrome in setting of low output failure/RV failure.  Creatinine stable at 1.47.     4. Atrial fibrillation: Chronic.  Rate is not elevated.  5. Post-op anemia: Also component of anemia of renal disease/chronic disease.  He got 1 unit PRBCs 2/12, appropriate increase in hgb.  Today's Hgb 8.1. Will need transfusion if drops lower.  6. Acute liver failure, post-op: Bilirubin improved. Likely hypoperfusion. Holding hepato-toxic agents.  7. Aortic stenosis s/p TAVR: Stable on echo.  8. ID: Afebrile.  WBCs up to 19.7 but afebrile and decreased cough. CXR improved on 2/21. Afebrile. Pseudomonas in respiratory culture, suspect tracheobronchitis.  He completed a course of meropenem and is now off abx. Acinetobacter also noted, discussed with ID and not treating at this time.  - Check UA.  - If WBCs continue to rise, would check in again with ID regarding the  acinetobacter.  9. Thrombocytopenia: Platelets back to normal.  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  10. Hyponatremia: Mild, stable.    11. Pocket site pain: Better with gabapentin.   HH order placed for RN/PT. Continue LVAD education.  Darrick Grinder, NP  12/27/2016, 9:24 AM  Advanced Heart Failure Team Pager [phone] (M-F; 7a - 4p)  Please contact [facility] for night-coverage after hours (4p -7a ) and weekends on [email or web address]  Patient seen with NP, agree with the above note.  Stable co-ox.  May change to po Lasix today.   INR remains subtherapeutic.  Continue heparin gtt, can take pacing wires out when off heparin gtt.   Afebrile, decreased cough.  Has completed meropenem for Pseudomonas tracheobronchitis.  WBCs rising.  Will get UA.  If continue to rise, would check in with ID again regarding acinetobacter noted prior in sputum (recommended not to treat at the time).   Hgb 8.1, transfuse if drops further.   LVAD parameters stable. Walking halls without difficulty.  Possibly home Monday if all issues resolved.   Loralie Champagne 12/27/2016 10:09 AM

## 2016-12-27 NOTE — Progress Notes (Signed)
ANTICOAGULATION CONSULT NOTE - Follow Up Consult  Pharmacy Consult for heparin while INR subtherapeutic Indication: atrial fibrillation/LVAD  Allergies  Allergen Reactions  . No Known Allergies     Patient Measurements: Height: 5\' 3"  (160 cm) Weight: 189 lb 3.2 oz (85.8 kg) IBW/kg (Calculated) : 56.9  Heparin dosing weight: 76kg  Vital Signs: Temp: 98.1 F (36.7 C) (02/23 0400) Temp Source: Oral (02/23 0400) Pulse Rate: 83 (02/23 0400)  Labs:  Recent Labs  12/25/16 0442 12/25/16 0920 12/26/16 0441 12/26/16 0851 12/26/16 0940 12/27/16 0515 12/27/16 0519 12/27/16 0851  HGB 8.2*  --  8.3*  --   --  8.5*  --  8.1*  HCT 25.6*  --  25.6*  --   --  26.3*  --  25.4*  PLT 179  --  178  --   --  179  --  166  LABPROT  --  17.9*  --   --  17.2*  --  17.2*  --   INR  --  1.47  --   --  1.40  --  1.39  --   HEPARINUNFRC  --  0.20*  --   --  0.40  --  0.30  --   CREATININE 1.50*  --   --  1.47*  --   --   --   --     Estimated Creatinine Clearance: 44.7 mL/min (by C-G formula based on SCr of 1.47 mg/dL (H)).   Assessment: 70yom on apixaban pta for afib (last dose 1/31 @ 2110), transitioned to IV heparin for LVAD placement (2/9).   Patient now POD #13 on warfarin.  Bivalirudin off as negative SRA.  Heparin level up to 0.3 (aiming for low goal of 0.2-0.3) on gtt at 1150 units/hr. No bleeding noted. CBC stable with platelets at 176.   INR still low at 1.3(down). Will give extra warfarin tonight. His requirements seem to be much high than when he was immediately post-op.   Goal of Therapy:  INR 2-2.5 Goal Heparin level 0.2-0.3 Monitor platelets by anticoagulation protocol: Yes   Plan:  -Continue Heparin at 1150 units/hr - aiming for low narrow goal -Warfarin 10mg  tonight to try and expedite getting off heparin  Erin Hearing PharmD., BCPS Clinical Pharmacist Pager [phone] 12/27/2016 9:47 AM

## 2016-12-27 NOTE — Progress Notes (Signed)
PT Cancellation Note  Patient Details Name: Trevor [name] MRN: [id number] DOB: 1946/07/29   Cancelled Treatment:    Reason Eval/Treat Not Completed: Patient at procedure or test/unavailable. Pt undergoing dressing change. Will re-attempt as time allows.   Shary Decamp [place]ok 12/27/2016, 2:59 PM [employer] PT [phone]

## 2016-12-27 NOTE — Discharge Instructions (Addendum)
Information on my medicine - Coumadin   (Warfarin)  This medication education was reviewed with me or my healthcare representative as part of my discharge preparation.  The pharmacist that spoke with me during my hospital stay was:  Georgina Peer, [facility]  Why was Coumadin prescribed for you? Coumadin was prescribed for you because you have a blood clot or a medical condition that can cause an increased risk of forming blood clots. Blood clots can cause serious health problems by blocking the flow of blood to the heart, lung, or brain. Coumadin can prevent harmful blood clots from forming. As a reminder your indication for Coumadin is:   Blood Clot Prevention After Heart Pump Surgery  What test will check on my response to Coumadin? While on Coumadin (warfarin) you will need to have an INR test regularly to ensure that your dose is keeping you in the desired range. The INR (international normalized ratio) number is calculated from the result of the laboratory test called prothrombin time (PT).  If an INR APPOINTMENT HAS NOT ALREADY BEEN MADE FOR YOU please schedule an appointment to have this lab work done by your health care provider within 7 days.  Your INR goal is 2-2.5.    What  do you need to  know  About  COUMADIN? Take Coumadin (warfarin) exactly as prescribed by your healthcare provider about the same time each day.  DO NOT stop taking without talking to the doctor who prescribed the medication.  Stopping without other blood clot prevention medication to take the place of Coumadin may increase your risk of developing a new clot or stroke.  Get refills before you run out.  What do you do if you miss a dose? If you miss a dose, take it as soon as you remember on the same day then continue your regularly scheduled regimen the next day.  Do not take two doses of Coumadin at the same time.  Important Safety Information A possible side effect of Coumadin (Warfarin) is an increased risk of  bleeding. You should call your healthcare provider right away if you experience any of the following: ? Bleeding from an injury or your nose that does not stop. ? Unusual colored urine (red or dark brown) or unusual colored stools (red or black). ? Unusual bruising for unknown reasons. ? A serious fall or if you hit your head (even if there is no bleeding).  Some foods or medicines interact with Coumadin (warfarin) and might alter your response to warfarin. To help avoid this: ? Eat a balanced diet, maintaining a consistent amount of Vitamin K. ? Notify your provider about major diet changes you plan to make. ? Avoid alcohol or limit your intake to 1 drink for women and 2 drinks for men per day. (1 drink is 5 oz. wine, 12 oz. beer, or 1.5 oz. liquor.)  Make sure that ANY health care provider who prescribes medication for you knows that you are taking Coumadin (warfarin).  Also make sure the healthcare provider who is monitoring your Coumadin knows when you have started a new medication including herbals and non-prescription products.  Coumadin (Warfarin)  Major Drug Interactions  Increased Warfarin Effect Decreased Warfarin Effect  Alcohol (large quantities) Antibiotics (esp. Septra/Bactrim, Flagyl, Cipro) Amiodarone (Cordarone) Aspirin (ASA) Cimetidine (Tagamet) Megestrol (Megace) NSAIDs (ibuprofen, naproxen, etc.) Piroxicam (Feldene) Propafenone (Rythmol SR) Propranolol (Inderal) Isoniazid (INH) Posaconazole (Noxafil) Barbiturates (Phenobarbital) Carbamazepine (Tegretol) Chlordiazepoxide (Librium) Cholestyramine (Questran) Griseofulvin Oral Contraceptives Rifampin Sucralfate (Carafate) Vitamin K  Coumadin (Warfarin) Major Herbal Interactions  Increased Warfarin Effect Decreased Warfarin Effect  Garlic Ginseng Ginkgo biloba Coenzyme Q10 Green tea St. Johns wort    Coumadin (Warfarin) FOOD Interactions  Eat a consistent number of servings per week of foods HIGH in  Vitamin K (1 serving =  cup)  Collards (cooked, or boiled & drained) Kale (cooked, or boiled & drained) Mustard greens (cooked, or boiled & drained) Parsley *serving size only =  cup Spinach (cooked, or boiled & drained) Swiss chard (cooked, or boiled & drained) Turnip greens (cooked, or boiled & drained)  Eat a consistent number of servings per week of foods MEDIUM-HIGH in Vitamin K (1 serving = 1 cup)  Asparagus (cooked, or boiled & drained) Broccoli (cooked, boiled & drained, or raw & chopped) Brussel sprouts (cooked, or boiled & drained) *serving size only =  cup Lettuce, raw (green leaf, endive, romaine) Spinach, raw Turnip greens, raw & chopped   These websites have more information on Coumadin (warfarin):  FailFactory.se; VeganReport.com.au;

## 2016-12-27 NOTE — Progress Notes (Signed)
CARDIAC REHAB PHASE I   PRE:  Rate/Rhythm: 86 afib    BP: sitting 90    SaO2: 88-90 RA  MODE:  Ambulation: 270 ft   POST:  Rate/Rhythm: 103 afib    BP: sitting 100     SaO2: 97 2L  Pt feeling tired, woozy, and SOB today. Donned batteries independently but needed rest. SaO2 88-90 RA (however hard to register/read due to afib). Pt walked 100 ft on RA. Rested due to SOB, wooziness, fatigue. SaO2 still low, 89 RA. Applied 2L for rest of walk. Rest again x1. Return to room, SaO2 97 2L (registered quickly). Pt feels less SOB and more energy on 2L. Encouraged more IS. BP slightly elevated today. To recliner for lunch. [place], ACSM 12/27/2016 11:55 AM

## 2016-12-27 NOTE — Progress Notes (Signed)
VAD Discharge Teaching Note:  Discharge VAD teaching completed with Dennie Bible and his caregiver support team:             Kizzie Ide Sister  The home inspection checklist has been reviewed and no unsafe conditions have been identified. Specifically the family again reports that there are at least two dedicated grounded, 3-prong outlets with clearly labeled circuit breaker has been established in the bedroom for Charity fundraiser and MPU, running water is in the home as well as a reliable telephone.   A daily flow sheet with patient  weight, temperature,  flow, speed, power, and PI, along with daily self checks on system controller have been performed by patient and caregiver(s) during hospitalization and will also be done daily at home.   Both patient and caregivers have been trained on the following:   1. HeartMate 2 LVAD overview of system operations  2. Overview of major lifestyle accommodations and cautions   3. Overview of system components (features and functions) 4. Changing power sources 5. Overview of alerts and alarms 6. How to identify and manage an emergency including when pump is running and when pump has stopped      7. Changing system controller 8. Maintain emergency contact list and medications  The patient and patent's caregivers have successfully demonstrated:   1. Changing power source (from batteries to MPU/PM, MPU/PM to batteries replacing batteries) 2. Perform system controller self test  3. Check and charge batteries  4. Identify routine maintanence for MPU/UBC/batteries.  5. Change system controller. 6. Paged VAD pager and programmed number in phones.  Exit Site Care:  The caregiver(s) has been trained on percutaneous lead exit site care and dressing changes and have performed sterile dressing changes during patient's hospitalization under nursing supervision. The importance of lead immobilization has been stressed to patient and  caregiver(s) using the attachment device. The caregiver has successfully demonstrated the following: 1. Accurate sterile technique 2. Cleaning of the site per hospital protocol 2. Applying the dressing correctly  3. Immobilizing drive line appropriately   The following routine activities and maintenance have been reviewed with patient and caregiver(s) and both verbalize understanding:  1. Stressed importance of never disconnecting power from both controller power leads at the same time, and never disconnecting both batteries at the same time.  2. Plug the Mobile Power Unit (MPU) and the universal Charity fundraiser (UBC) into properly grounded (3 prong) outlets dedicated to MPU/UBC use. Do NOT use adapter (cheater plug) for ungrounded outlets or multiple portable socket outlets (power strips). 3. Do not connect the MPU or UBC to an outlet controlled by wall switch or the device may not work 4. The MPU AA batteries that provide power to the speaker and indicator lights should be changed every 6 months.  6. Transfer from MPU to batteries during [facility] power failure. The system controller will provide 62mof power to run the pump at set speed.  7. Keep a backup system controller, charged batteries, battery clips, and flashlight near you during sleep in case of electrical power outage 8. Clean battery, battery clip, and universal battery charger contacts weekly 9. Visually inspect percutaneous lead daily - report any tearing, splitting or damage.  10. Check cables and connectors when changing power source  11. Rotate batteries weekly; keep all eight batteries charged. Specific emphasis to remember the emergency back up batteries.  12. Always have backup system controller, battery clips, fully charged batteries, and spare fully charged batteries  when traveling. Always notify your VAD Team of travel >3 hours distance away.  13. Re-calibrate batteries every 70 uses; recharge back up system controller every 6  months, monitor battery life of 36 months or 360 uses; replace batteries at end of battery life   Identified the following changes in activities of daily living with pump:  1. No driving for at least six weeks and then only if doctor gives permission to do so 2. No tub baths while pump implanted, and shower only if doctor gives permission with the manufacturer shower equipment.  3. No swimming or submersion in water while implanted with pump 4. Keep all VAD equipment away from water or moisture 5. Keep all VAD connections clean and dry 6. No contact sports or engage in jumping activities 7. Avoid strong static electricity (touching TV/computer screens, vacuuming) 8. Never have an MRI while implanted with the pump 9. Never leave or store batteries in extremely hot or cold places (such as   trunk of your car), or the battery life will be shortened 10. Call the doctor or hospital contact person if any change in how the pump sounds, feels, or works 11. Plan to sleep only when connected to the MPU. 12. Keep a backup system controller, charged batteries, battery clips, and flashlight near you during sleep in case of electrical power outage 13. Do not sleep on your stomach and avoid laying on driveline exit site, especially while it is healing.  14. Talk with doctor/coordinator before any long distance travel plans 15. Patient will need antibiotics prior to any dental procedure; instructed to contact VAD coordinator before any dental procedures (including routine cleaning) 16. Paitent will maintain appointments with PCP.    Discharge binder given to patient and include the following:  1. Discharge instructions - [place] VAD Patient Education Binder 2. List of emergency contacts 3. Wallet card 4. HM 2 VAD Luggage tags 5. Guide to Percutaneous Lead Care and Equipment Maintenance 6. HM 2 Alarms for Patients and their Caregivers 7. HM 2 Patient Handbook 8. HM 2 Information and  Emergency Assistance Guide 9. HM 2 MPU Alarms, Care & Maintenance 11. Daily diary sheets 12. Care of the Percutaneous Lead  Discharge equipment includes:  1. Two system controllers 2. One MPU with 79' patient cable 3. One universal Charity fundraiser (UBC) 4. Eight fully charged 14V Li-Ion batteries      5. Four battery clips 6. One travel case 7. Medium Holster Vest 8. Wearable accessory kit 9. Shower bags (2) 10. 20 dressings and 5 anchors  The following notification process will be completed with:  [place]  [facility],  PCP   Warfarin / ASA Education & Management:  Discussed frequency and importance of INR checks and taking coumadin/ASA as prescribed by the doctor; emphasized importance of maintaining INR goal to prevent clotting and/or bleeding issues with pump. He has never been on warfarin in the past and will meet more with coordinators and pharm-D on outpatient basis. He has received inpatient coumadin education and has been educated on diet and importance to inform us about medication changes made outside VAD clinic. Reinforced that he should ONLY take instructions regarding his coumadin from our VAD team and we need to be involved in any elective procedures that require warfarin hold.   Able to answer questions and asked good questions pertaining to warfarin and diet/lifestyle changes necessary to be successful and safe.   Controller Change Outs: 1.  Patient should have a caregiver present during system controller exchange or call 911 and VAD pager before attempting to change controller if alone. 2. As part of the weekly safety checklist, the patient and caregiver should review and understand the steps and instructions involved with replacing the system controller.  3. As part of monthly safety checklist, the patient and caregiver should review and understand the system controller alarms and how to resolve them.   4. Updated copy of HM 2 Guide to Replacing the [place] with the [address] [place] for Patients and Their Caregiver(s) was given and reviewed.  5. HM 2 Home Flowsheets given that include the weekly and monthly checks as noted above.  Post-Education Competency Test: The patient has completed a proficiency test for the HM 2 and all questions have been answered. The pt and family have been instructed to call if any questions, problems, or concerns arise. Pt and caregiver successfully paged VAD coordinator using VAD pager emergency number and have been instructed to use this number only for emergencies. Caregiver(s) asked appropriate questions, had good interaction with VAD coordinator, and verbalized understanding of above instructions. Patient was sleepy due to pain medication needs at times during education. Follow up will be made Monday to validate evidence of learning.    Balinda Quails RN, VAD Coordinator 24/7 pager [phone]

## 2016-12-28 LAB — CBC
HCT: 23.9 % — ABNORMAL LOW (ref 39.0–52.0)
Hemoglobin: 7.8 g/dL — ABNORMAL LOW (ref 13.0–17.0)
MCH: 30.4 pg (ref 26.0–34.0)
MCHC: 32.6 g/dL (ref 30.0–36.0)
MCV: 93 fL (ref 78.0–100.0)
Platelets: 154 10*3/uL (ref 150–400)
RBC: 2.57 MIL/uL — ABNORMAL LOW (ref 4.22–5.81)
RDW: 17.7 % — AB (ref 11.5–15.5)
WBC: 17.3 10*3/uL — ABNORMAL HIGH (ref 4.0–10.5)

## 2016-12-28 LAB — BASIC METABOLIC PANEL
Anion gap: 9 (ref 5–15)
BUN: 47 mg/dL — ABNORMAL HIGH (ref 6–20)
CALCIUM: 8.8 mg/dL — AB (ref 8.9–10.3)
CO2: 37 mmol/L — ABNORMAL HIGH (ref 22–32)
CREATININE: 1.52 mg/dL — AB (ref 0.61–1.24)
Chloride: 85 mmol/L — ABNORMAL LOW (ref 101–111)
GFR calc non Af Amer: 44 mL/min — ABNORMAL LOW (ref >60)
GFR, EST AFRICAN AMERICAN: 51 mL/min — AB (ref >60)
Glucose, Bld: 122 mg/dL — ABNORMAL HIGH (ref 65–99)
Potassium: 4.3 mmol/L (ref 3.5–5.1)
SODIUM: 131 mmol/L — AB (ref 135–145)

## 2016-12-28 LAB — GLUCOSE, CAPILLARY
GLUCOSE-CAPILLARY: 181 mg/dL — AB (ref 65–99)
GLUCOSE-CAPILLARY: 179 mg/dL — AB (ref 65–99)
Glucose-Capillary: 186 mg/dL — ABNORMAL HIGH (ref 65–99)
Glucose-Capillary: 145 mg/dL — ABNORMAL HIGH (ref 65–99)

## 2016-12-28 LAB — PROTIME-INR
INR: 1.42
Prothrombin Time: 17.5 seconds — ABNORMAL HIGH (ref 11.4–15.2)

## 2016-12-28 LAB — URINE CULTURE

## 2016-12-28 LAB — LACTATE DEHYDROGENASE: LDH: 368 U/L — ABNORMAL HIGH (ref 98–192)

## 2016-12-28 LAB — COOXEMETRY PANEL
Carboxyhemoglobin: 2.2 % — ABNORMAL HIGH (ref 0.5–1.5)
Methemoglobin: 0.8 % (ref 0.0–1.5)
O2 Saturation: 61.7 %
Total hemoglobin: 10.1 g/dL — ABNORMAL LOW (ref 12.0–16.0)

## 2016-12-28 LAB — HEPARIN LEVEL (UNFRACTIONATED): HEPARIN UNFRACTIONATED: 0.27 [IU]/mL — AB (ref 0.30–0.70)

## 2016-12-28 MED ORDER — GABAPENTIN 300 MG PO CAPS
300.0000 mg | ORAL_CAPSULE | Freq: Once | ORAL | Status: AC
  Administered 2016-12-28: 300 mg via ORAL
  Filled 2016-12-28: qty 1

## 2016-12-28 MED ORDER — FUROSEMIDE 80 MG PO TABS
80.0000 mg | ORAL_TABLET | Freq: Every day | ORAL | Status: DC
  Administered 2016-12-29: 80 mg via ORAL
  Filled 2016-12-28: qty 1

## 2016-12-28 MED ORDER — WARFARIN SODIUM 10 MG PO TABS
10.0000 mg | ORAL_TABLET | Freq: Once | ORAL | Status: AC
  Administered 2016-12-28: 10 mg via ORAL
  Filled 2016-12-28: qty 1

## 2016-12-28 MED ORDER — FENTANYL CITRATE (PF) 100 MCG/2ML IJ SOLN
25.0000 ug | Freq: Once | INTRAMUSCULAR | Status: AC
  Administered 2016-12-28: 25 ug via INTRAVENOUS
  Filled 2016-12-28: qty 2

## 2016-12-28 NOTE — Progress Notes (Signed)
CARDIAC REHAB PHASE I   PRE:  Rate/Rhythm: Afib 85  BP:  Supine:   Sitting: 80  Standing:    SaO2: 94  On room air ( removed o2)  MODE:  Ambulation: 350 ft   POST:  Rate/Rhythm: afib 110  BP:  Supine:   Sitting:  84  Standing:    SaO2: 88 RA  Rebounded to 93 after sitting 5 minutes with plb  Pt assisted to ambulate 350 feet in the hallway with RW and rehab staff. Oxygen therapy removed and O2 sat checked on room air.  Pt in agreement to try to ambulate without the oxygen.  Pt demonstrated mastery of checking portable batteries and placing clips on.  Pt able to don vest and secure the controller for ambulation.  Pt did note to have difficulty switching his battery cable to the first battery. Pt needed 3 attempts to successfully attach.  Pt did show good problem solving when he realized he had made an error with connecting the portable battery to the controller. Pt able to demonstrate appropriately with the second battery connection.  Pt with congested cough thick yellow sputum.  Pt able to reconnect himself to the bedside unit with no difficulties.  Pt to chair, call bell in place with no complaints.  Advised pt to ambulate again with nursing staff as well as on tomorrow.  Pt verbalized understanding. Cherre Huger, BSN [phone]

## 2016-12-28 NOTE — Progress Notes (Signed)
Patient's PI values have been fluctuating between 3.0-4.2. Patient is asymptomatic. Patient states that, "He feels great". Will continue to monitor.

## 2016-12-28 NOTE — Progress Notes (Signed)
ANTICOAGULATION CONSULT NOTE - Follow Up Consult  Pharmacy Consult for heparin while INR subtherapeutic Indication: atrial fibrillation/LVAD  Allergies  Allergen Reactions  . No Known Allergies     Patient Measurements: Height: 5\' 3"  (160 cm) Weight: 190 lb 6.4 oz (86.4 kg) IBW/kg (Calculated) : 56.9  Heparin dosing weight: 76kg  Vital Signs: BP: 91/64 (02/24 0423) Pulse Rate: 78 (02/24 0423)  Labs:  Recent Labs  12/26/16 [id number] 12/26/16 0940 12/27/16 0515 12/27/16 0519 12/27/16 0851 12/28/16 0349 12/28/16 0431  HGB  --   --  8.5*  --  8.1*  --  7.8*  HCT  --   --  26.3*  --  25.4*  --  23.9*  PLT  --   --  179  --  166  --  154  LABPROT  --  17.2*  --  17.2*  --   --  17.5*  INR  --  1.40  --  1.39  --   --  1.42  HEPARINUNFRC  --  0.40  --  0.30  --  0.27*  --   CREATININE 1.47*  --   --   --  1.47*  --  1.52*    Estimated Creatinine Clearance: 43.3 mL/min (by C-G formula based on SCr of 1.52 mg/dL (H)).   Assessment: 70yom on apixaban pta for afib (last dose 1/31 @ 2110), transitioned to IV heparin for LVAD placement (2/9).   Patient now POD #14 on warfarin.  Bivalirudin off as negative SRA.  Heparin level therapeutic at 0.27 (aiming for low goal of 0.2-0.3) on gtt at 1150 units/hr. No bleeding noted. CBC with slow down trend: HgB 7.8 and platelets 154.   INR still low at 1.42. Will repeat boosted warfarin dose tonight. His requirements seem to be much high than when he was immediately post-op.   Goal of Therapy:  INR 2-2.5 Goal Heparin level 0.2-0.3 Monitor platelets by anticoagulation protocol: Yes   Plan:  -Continue Heparin at 1150 units/hr - aiming for low narrow goal -Warfarin 10mg  tonight to try and expedite getting off heparin -Monitor for s/sx of bleeding  Georga Bora, PharmD Clinical Pharmacist Pager: [phone] 12/28/2016 1:26 PM

## 2016-12-28 NOTE — Progress Notes (Addendum)
Patient ID: Trevor [name], male   DOB: 11-Nov-1945, 71 y.o.   MRN: [id number]    Advanced Heart Failure Rounding Note   Subjective:    Admitted with marked volume overload and low output HF.  Echo EF 20-25%. Failed dual inotropes. IABP placed 2/7.   Underwent HM-II placement and TV repair on 2/9.   1 unit PRBCs 2/12.   Respiratory cultures with Pseudomonas, meropenem course completed.  Acinetobacter also isolated, discussed with ID and recommended not treating this since extubated.    Feels well. Co-ox 62%. Has diuresed well. Weight near baseline. Walking halls.   Denies SOB. Overall feeling better.   LVAD parameters:   Speed 9000  Flow 5.5 PI 4.4  Power 5.0 Multiple PI events. Last night with No External Power alarm.   Objective:   Weight Range:  Vital Signs:   Temp:  [97.3 F (36.3 C)-97.6 F (36.4 C)] 97.6 F (36.4 C) (02/23 2042) Pulse Rate:  [76-78] 78 (02/24 0423) Resp:  [18] 18 (02/24 0423) BP: (91-103)/(64-74) 91/64 (02/24 0423) SpO2:  [92 %-100 %] 92 % (02/24 0912) Weight:  [86.4 kg (190 lb 6.4 oz)] 86.4 kg (190 lb 6.4 oz) (02/24 0457) Last BM Date: 12/27/16  Weight change: Filed Weights   12/26/16 0417 12/27/16 0456 12/28/16 0457  Weight: 87.2 kg (192 lb 3.2 oz) 85.8 kg (189 lb 3.2 oz) 86.4 kg (190 lb 6.4 oz)    Intake/Output:   Intake/Output Summary (Last 24 hours) at 12/28/16 0922 Last data filed at 12/28/16 0438  Gross per 24 hour  Intake              532 ml  Output             1175 ml  Net             -643 ml    MAPs 70s  Physical Exam: General:  NAD. In chair   HEENT: normal   Neck: supple, JVP 5-6.  Cor: Sternal dressing in place.  LVAD hum  Lungs:  Decreased in the bases. On room air.   Abdomen: obese soft, NT. Normal BS. Driveline site ok Extremities: no cyanosis, clubbing, rash.  Trace edema Neuro: Alert/oriented.    Telemetry: AF 70-80s. Personally reviewed   Labs: Basic Metabolic Panel:  Recent Labs Lab 12/24/16 0408  12/25/16 0442 12/26/16 0851 12/27/16 0851 12/28/16 0431  NA 132* 130* 130* 132* 131*  K 3.7 3.7 4.0 4.3 4.3  CL 87* 85* 83* 83* 85*  CO2 36* 35* 35* 37* 37*  GLUCOSE 149* 198* 189* 150* 122*  BUN 62* 53* 46* 46* 47*  CREATININE 1.64* 1.50* 1.47* 1.47* 1.52*  CALCIUM 8.5* 8.4* 8.6* 8.8* 8.8*    Liver Function Tests:  Recent Labs Lab 12/22/16 0401 12/23/16 0505 12/24/16 0408 12/25/16 0442  AST 60* 50* 59* 44*  ALT 51 45 49 40  ALKPHOS 140* 135* 149* 136*  BILITOT 1.7* 1.5* 1.5* 1.0  PROT 5.7* 5.7* 5.9* 5.8*  ALBUMIN 2.5* 2.5* 2.6* 2.6*   No results for input(s): LIPASE, AMYLASE in the last 168 hours. No results for input(s): AMMONIA in the last 168 hours.  CBC:  Recent Labs Lab 12/25/16 0442 12/26/16 0441 12/27/16 0515 12/27/16 0851 12/28/16 0431  WBC 13.3* 13.6* 18.2* 19.7* 17.3*  HGB 8.2* 8.3* 8.5* 8.1* 7.8*  HCT 25.6* 25.6* 26.3* 25.4* 23.9*  MCV 91.8 92.4 92.9 93.4 93.0  PLT 179 178 179 166 154    Cardiac Enzymes: No results  for input(s): CKTOTAL, CKMB, CKMBINDEX, TROPONINI in the last 168 hours.  BNP: BNP (last 3 results)  Recent Labs  11/28/16 1104 12/14/16 0348  BNP 1,825.3* 427.7*    ProBNP (last 3 results)  Recent Labs  11/27/16 1207  PROBNP 1,746.0*      Other results:  Imaging: No results found.   Medications:     Scheduled Medications: . arformoterol  15 mcg Nebulization BID  . aspirin  81 mg Oral Daily  . bisacodyl  10 mg Oral Daily   Or  . bisacodyl  10 mg Rectal Daily  . budesonide (PULMICORT) nebulizer solution  0.5 mg Nebulization BID  . busPIRone  5 mg Oral BID  . Chlorhexidine Gluconate Cloth  6 each Topical Daily  . docusate sodium  200 mg Oral Daily  . feeding supplement (GLUCERNA SHAKE)  237 mL Oral BID BM  . ferrous Q000111Q C-folic acid  1 capsule Oral BID  . fluticasone  1 spray Each Nare Daily  . furosemide  80 mg Oral BID  . gabapentin  300 mg Oral Daily  . guaiFENesin  600 mg Oral BID   . insulin aspart  0-15 Units Subcutaneous TID WC  . insulin aspart  0-5 Units Subcutaneous QHS  . levothyroxine  25 mcg Oral QAC breakfast  . mouth rinse  15 mL Mouth Rinse BID  . pantoprazole  40 mg Oral Q1200  . potassium chloride  40 mEq Oral BID  . sildenafil  20 mg Oral TID  . sodium chloride flush  10-40 mL Intracatheter Q12H  . sorbitol  30 mL Oral Once  . umeclidinium bromide  1 puff Inhalation Daily  . warfarin   Does not apply Once  . Warfarin - Pharmacist Dosing Inpatient   Does not apply q1800    Infusions: . sodium chloride    . sodium chloride 20 mL/hr at 12/23/16 0700  . heparin 1,150 Units/hr (12/26/16 2303)    PRN Medications: Place/Maintain arterial line **AND** sodium chloride, acetaminophen, levalbuterol, ondansetron (ZOFRAN) IV, oxyCODONE, sodium chloride flush, traMADol, traZODone   Assessment/Plan/Discussion    1. Acute on chronic systolic CHF: Ischemic cardiomyopathy but with prominent RV dysfunction on echo and exam.  Medtronic ICD.  Echo this admission showed EF 20-25% with moderately dilated LV and moderately dilated/moderately dysfunctional RV with severe TR.  He was admitted with low output biventricular failure, cardiorenal syndrome, and marked volume overload. Failed dual inotrope support. IABP placed 2/7. HM-II and TV repair performed 2/9 after discussion with [facility] transplant team. POD #3 HM-II and TV repair.  Extubated 2/11, off NO, vasopressin, and epinephrine. Speed decreased to 8800 rpm after ramp echo on 2/16 for RV support but increased back to 9000 to help get him off norepinephrine.  Off norepi and milrinone.  Volume status stable. Creatinine stable at 1.4-1.5 - Volume status stable on po lasix 80 bid. PI running a bit lower. Will drop lasix to 80 daily. May need to decrease VAD speed to 8800 - Co-ox 62% off milrinone.    - Continue LVAD speed at 9000. IF PI running low can cut back to 8800 - Sildenafil begun at 20 mg tid for RV failure.   -  Continue warfarin, ASA 81 daily.  INR 1.4. On heparin. Will need pacing wires out when off heparin gtt.  - On asa 81 mg daily  - No External Power Alarm d/w patient and nursing staff and educated to always keep 1 power source connected at all times.  2.  CAD: s/p CABG.  No signs/sx of ischemia.  3. AKI on CKD stage III: Suspect cardiorenal syndrome in setting of low output failure/RV failure.  Creatinine stable at 1.51     4. Atrial fibrillation: Chronic.  Rate is controlled 5. Post-op anemia: Also component of anemia of renal disease/chronic disease.  He got 1 unit PRBCs 2/12, appropriate increase in hgb.  Today's Hgb 8.1-> 7.8 . Will get T&S in am and if lower will transfuse 1u RBCs 6. Acute liver failure, post-op: Bilirubin improved. Likely hypoperfusion. Holding hepato-toxic agents.  7. Aortic stenosis s/p TAVR: Stable on echo.  8. ID: Afebrile.  WBCs coming down 19->17k but afebrile and decreased cough. CXR improved on 2/21. Afebrile. Pseudomonas in respiratory culture, suspect tracheobronchitis.  He completed a course of meropenem and is now off abx. Acinetobacter also noted, discussed with ID and not treating at this time.  - UA 2/23 is negative.  - If WBCs continue to rise, would check in again with ID regarding the acinetobacter.  9. Thrombocytopenia: Platelets back to normal.  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  10. Hyponatremia: Mild, stable.    11. Pocket site pain: Better with gabapentin.    Continue PT and LVAD education.  Glori Bickers, MD  12/28/2016, 9:22 AM  Advanced Heart Failure Team Pager [phone] (M-F; 7a - 4p)  Please contact [facility] for night-coverage after hours (4p -7a ) and weekends on [email or web address]

## 2016-12-29 LAB — GLUCOSE, CAPILLARY
GLUCOSE-CAPILLARY: 212 mg/dL — AB (ref 65–99)
GLUCOSE-CAPILLARY: 129 mg/dL — AB (ref 65–99)
GLUCOSE-CAPILLARY: 146 mg/dL — AB (ref 65–99)
Glucose-Capillary: 131 mg/dL — ABNORMAL HIGH (ref 65–99)
Glucose-Capillary: 179 mg/dL — ABNORMAL HIGH (ref 65–99)

## 2016-12-29 LAB — HEPARIN LEVEL (UNFRACTIONATED)
Heparin Unfractionated: 0.16 IU/mL — ABNORMAL LOW (ref 0.30–0.70)
Heparin Unfractionated: 0.22 IU/mL — ABNORMAL LOW (ref 0.30–0.70)

## 2016-12-29 LAB — BASIC METABOLIC PANEL
Anion gap: 11 (ref 5–15)
BUN: 48 mg/dL — ABNORMAL HIGH (ref 6–20)
CHLORIDE: 83 mmol/L — AB (ref 101–111)
CO2: 34 mmol/L — AB (ref 22–32)
Calcium: 8.7 mg/dL — ABNORMAL LOW (ref 8.9–10.3)
Creatinine, Ser: 1.67 mg/dL — ABNORMAL HIGH (ref 0.61–1.24)
GFR calc non Af Amer: 40 mL/min — ABNORMAL LOW (ref >60)
GFR, EST AFRICAN AMERICAN: 46 mL/min — AB (ref >60)
Glucose, Bld: 124 mg/dL — ABNORMAL HIGH (ref 65–99)
POTASSIUM: 4.8 mmol/L (ref 3.5–5.1)
Sodium: 128 mmol/L — ABNORMAL LOW (ref 135–145)

## 2016-12-29 LAB — CBC
HEMATOCRIT: 29.9 % — AB (ref 39.0–52.0)
HEMATOCRIT: 21.6 % — AB (ref 39.0–52.0)
HEMOGLOBIN: 9.7 g/dL — AB (ref 13.0–17.0)
Hemoglobin: 7.1 g/dL — ABNORMAL LOW (ref 13.0–17.0)
MCH: 30.1 pg (ref 26.0–34.0)
MCH: 30.2 pg (ref 26.0–34.0)
MCHC: 32.4 g/dL (ref 30.0–36.0)
MCHC: 32.9 g/dL (ref 30.0–36.0)
MCV: 92.9 fL (ref 78.0–100.0)
MCV: 91.9 fL (ref 78.0–100.0)
Platelets: 123 10*3/uL — ABNORMAL LOW (ref 150–400)
Platelets: 166 10*3/uL (ref 150–400)
RBC: 3.22 MIL/uL — AB (ref 4.22–5.81)
RBC: 2.35 MIL/uL — ABNORMAL LOW (ref 4.22–5.81)
RDW: 18 % — ABNORMAL HIGH (ref 11.5–15.5)
RDW: 18.4 % — AB (ref 11.5–15.5)
WBC: 12.9 10*3/uL — ABNORMAL HIGH (ref 4.0–10.5)
WBC: 23.7 10*3/uL — ABNORMAL HIGH (ref 4.0–10.5)

## 2016-12-29 LAB — COOXEMETRY PANEL
Carboxyhemoglobin: 2.8 % — ABNORMAL HIGH (ref 0.5–1.5)
Methemoglobin: 1.5 % (ref 0.0–1.5)
O2 Saturation: 58.1 %
Total hemoglobin: 4.6 g/dL — CL (ref 12.0–16.0)

## 2016-12-29 LAB — PREPARE RBC (CROSSMATCH)

## 2016-12-29 LAB — PROTIME-INR
INR: 1.6
Prothrombin Time: 19.2 seconds — ABNORMAL HIGH (ref 11.4–15.2)

## 2016-12-29 LAB — LACTATE DEHYDROGENASE: LDH: 370 U/L — ABNORMAL HIGH (ref 98–192)

## 2016-12-29 MED ORDER — SODIUM CHLORIDE 0.9 % IV SOLN
Freq: Once | INTRAVENOUS | Status: AC
  Administered 2016-12-29: 22:00:00 via INTRAVENOUS

## 2016-12-29 MED ORDER — SODIUM CHLORIDE 0.9 % IV SOLN: Freq: Once | INTRAVENOUS | Status: AC

## 2016-12-29 MED ORDER — WARFARIN SODIUM 10 MG PO TABS
10.0000 mg | ORAL_TABLET | Freq: Once | ORAL | Status: AC
  Administered 2016-12-29: 10 mg via ORAL
  Filled 2016-12-29: qty 1

## 2016-12-29 NOTE — Progress Notes (Signed)
Patient ID: Trevor [name], male   DOB: 08-01-1946, 71 y.o.   MRN: [id number]    Advanced Heart Failure Rounding Note   Subjective:    Admitted with marked volume overload and low output HF.  Echo EF 20-25%. Failed dual inotropes. IABP placed 2/7.   Underwent HM-II placement and TV repair on 2/9.   1 unit PRBCs 2/12.   Respiratory cultures with Pseudomonas, meropenem course completed.  Acinetobacter also isolated, discussed with ID and recommended not treating this since extubated.    Had worsening pocket pain last night and given a dose of fentanyl and extra dose for neurontin. Feels better this am. Off milrinone. Co-ox 62%-58%. Walking halls. Hgb 9.7. Lasix decreased 80 bid to 80 qd yesterday due to frequent PI events. Weight up to 194. Denies SOB. Overall feeling better.   LVAD parameters:   Speed 9000  Flow 6.1 PI 4.2  Power 6.0 No PI events.   Objective:   Weight Range:  Vital Signs:   Temp:  [97.7 F (36.5 C)-98.2 F (36.8 C)] 98.2 F (36.8 C) (02/25 0532) Pulse Rate:  [93-97] 97 (02/25 0532) Resp:  [16-18] 16 (02/25 0532) SpO2:  [94 %-98 %] 94 % (02/25 0532) Weight:  [88.1 kg (194 lb 3.2 oz)] 88.1 kg (194 lb 3.2 oz) (02/25 0532) Last BM Date: 12/28/16  Weight change: Filed Weights   12/27/16 0456 12/28/16 0457 12/29/16 0532  Weight: 85.8 kg (189 lb 3.2 oz) 86.4 kg (190 lb 6.4 oz) 88.1 kg (194 lb 3.2 oz)    Intake/Output:   Intake/Output Summary (Last 24 hours) at 12/29/16 0923 Last data filed at 12/29/16 0539  Gross per 24 hour  Intake              900 ml  Output             1075 ml  Net             -175 ml    MAPs 70s  Physical Exam: General:  NAD. In chair   HEENT: normal   Neck: supple, JVP 7 Cor: Sternal wounds ok LVAD hum  Lungs:  Decreased in the bases. On room air.   Abdomen: obese soft, NT. Normal BS. Driveline site ok Extremities: no cyanosis, clubbing, rash.  Trace-1+ edema + TED Neuro: Alert/oriented.    Telemetry: AF 80s. Personally  reviewed   Labs: Basic Metabolic Panel:  Recent Labs Lab 12/25/16 0442 12/26/16 0851 12/27/16 0851 12/28/16 0431 12/29/16 0448  NA 130* 130* 132* 131* 128*  K 3.7 4.0 4.3 4.3 4.8  CL 85* 83* 83* 85* 83*  CO2 35* 35* 37* 37* 34*  GLUCOSE 198* 189* 150* 122* 124*  BUN 53* 46* 46* 47* 48*  CREATININE 1.50* 1.47* 1.47* 1.52* 1.67*  CALCIUM 8.4* 8.6* 8.8* 8.8* 8.7*    Liver Function Tests:  Recent Labs Lab 12/23/16 0505 12/24/16 0408 12/25/16 0442  AST 50* 59* 44*  ALT 45 49 40  ALKPHOS 135* 149* 136*  BILITOT 1.5* 1.5* 1.0  PROT 5.7* 5.9* 5.8*  ALBUMIN 2.5* 2.6* 2.6*   No results for input(s): LIPASE, AMYLASE in the last 168 hours. No results for input(s): AMMONIA in the last 168 hours.  CBC:  Recent Labs Lab 12/26/16 0441 12/27/16 0515 12/27/16 0851 12/28/16 0431 12/29/16 0448  WBC 13.6* 18.2* 19.7* 17.3* 12.9*  HGB 8.3* 8.5* 8.1* 7.8* 9.7*  HCT 25.6* 26.3* 25.4* 23.9* 29.9*  MCV 92.4 92.9 93.4 93.0 92.9  PLT 178  179 166 154 123*    Cardiac Enzymes: No results for input(s): CKTOTAL, CKMB, CKMBINDEX, TROPONINI in the last 168 hours.  BNP: BNP (last 3 results)  Recent Labs  11/28/16 1104 12/14/16 0348  BNP 1,825.3* 427.7*    ProBNP (last 3 results)  Recent Labs  11/27/16 1207  PROBNP 1,746.0*      Other results:  Imaging: No results found.   Medications:     Scheduled Medications: . arformoterol  15 mcg Nebulization BID  . aspirin  81 mg Oral Daily  . bisacodyl  10 mg Oral Daily   Or  . bisacodyl  10 mg Rectal Daily  . budesonide (PULMICORT) nebulizer solution  0.5 mg Nebulization BID  . busPIRone  5 mg Oral BID  . docusate sodium  200 mg Oral Daily  . feeding supplement (GLUCERNA SHAKE)  237 mL Oral BID BM  . ferrous Q000111Q C-folic acid  1 capsule Oral BID  . fluticasone  1 spray Each Nare Daily  . furosemide  80 mg Oral Daily  . gabapentin  300 mg Oral Daily  . guaiFENesin  600 mg Oral BID  . insulin  aspart  0-15 Units Subcutaneous TID WC  . insulin aspart  0-5 Units Subcutaneous QHS  . levothyroxine  25 mcg Oral QAC breakfast  . mouth rinse  15 mL Mouth Rinse BID  . pantoprazole  40 mg Oral Q1200  . potassium chloride  40 mEq Oral BID  . sildenafil  20 mg Oral TID  . sodium chloride flush  10-40 mL Intracatheter Q12H  . sorbitol  30 mL Oral Once  . umeclidinium bromide  1 puff Inhalation Daily  . warfarin   Does not apply Once  . Warfarin - Pharmacist Dosing Inpatient   Does not apply q1800    Infusions: . sodium chloride    . sodium chloride 20 mL/hr at 12/23/16 0700  . heparin 1,200 Units/hr (12/29/16 0600)    PRN Medications: Place/Maintain arterial line **AND** sodium chloride, acetaminophen, levalbuterol, ondansetron (ZOFRAN) IV, oxyCODONE, sodium chloride flush, traMADol, traZODone   Assessment/Plan/Discussion    1. Acute on chronic systolic CHF: Ischemic cardiomyopathy but with prominent RV dysfunction on echo and exam.  Medtronic ICD.  Echo this admission showed EF 20-25% with moderately dilated LV and moderately dilated/moderately dysfunctional RV with severe TR.  He was admitted with low output biventricular failure, cardiorenal syndrome, and marked volume overload. Failed dual inotrope support. IABP placed 2/7. HM-II and TV repair performed 2/9 after discussion with [facility] transplant team. POD #3 HM-II and TV repair.  Extubated 2/11, off NO, vasopressin, and epinephrine. Speed decreased to 8800 rpm after ramp echo on 2/16 for RV support but increased back to 9000 to help get him off norepinephrine.  Off norepi and milrinone.  Volume status stable. Creatinine stable at 1.4-1.5 - Lasix dropped to 80 daily yesterday as PI running a bit lower. PI events resolved. Weight up a few pounds. May need to increase back to 80/40 Will follow.  - Co-ox 62%->58% off milrinone.  Will need to continue to follow - Continue LVAD speed at 9000. - On Sildenafil 20 mg tid for RV failure.   -  Continue warfarin, ASA 81 daily.  INR 1.6. On heparin. Will need pacing wires out when off heparin gtt.  - On asa 81 mg daily   2. CAD: s/p CABG.  No signs/sx of ischemia.  3. AKI on CKD stage III: Suspect cardiorenal syndrome in setting of low output failure/RV  failure.  Creatinine up slightly 1.51-> 1.67 4. Atrial fibrillation: Chronic.  Rate is controlled 5. Post-op anemia: Also component of anemia of renal disease/chronic disease.  He got 1 unit PRBCs 2/12, appropriate increase in hgb.  Today's Hgb 8.1-> 7.8 -> 9.7 . 6. Acute liver failure, post-op: Bilirubin improved. Likely hypoperfusion. Holding hepato-toxic agents.  7. Aortic stenosis s/p TAVR: Stable on echo.  8. ID: Afebrile.  WBCs coming down 19->17k -> 12.9k  CXR improved on 2/21. Afebrile. Pseudomonas in respiratory culture, suspect tracheobronchitis.  He completed a course of meropenem and is now off abx. Acinetobacter also noted, discussed with ID and not treating at this time.  - UA 2/23 is negative.  9. Thrombocytopenia: Platelets 123k. 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  10. Hyponatremia: Mild, stable.    11. Pocket site pain: Better with gabapentin. Required an extra dose last night. Can increase as needed.    Continue PT and LVAD education.  Glori Bickers, MD  12/29/2016, 9:23 AM  Advanced Heart Failure Team Pager [phone] (M-F; 7a - 4p)  Please contact [facility] for night-coverage after hours (4p -7a ) and weekends on [email or web address]

## 2016-12-29 NOTE — Progress Notes (Signed)
ANTICOAGULATION CONSULT NOTE - Follow Up Consult  Pharmacy Consult for heparin while INR subtherapeutic Indication: atrial fibrillation/LVAD  Allergies  Allergen Reactions  . No Known Allergies    Patient Measurements: Height: 5\' 3"  (160 cm) Weight: 194 lb 3.2 oz (88.1 kg) IBW/kg (Calculated) : 56.9  Heparin dosing weight: 76kg  Vital Signs: Temp: 97.8 F (36.6 C) (02/25 1237) Temp Source: Oral (02/25 1237) Pulse Rate: 72 (02/25 1237)  Labs:  Recent Labs  12/27/16 0519 12/27/16 0851 12/28/16 0349 12/28/16 0431 12/29/16 0448 12/29/16 1329  HGB  --  8.1*  --  7.8* 9.7*  --   HCT  --  25.4*  --  23.9* 29.9*  --   PLT  --  166  --  154 123*  --   LABPROT 17.2*  --   --  17.5* 19.2*  --   INR 1.39  --   --  1.42 1.60  --   HEPARINUNFRC 0.30  --  0.27*  --  0.16* 0.22*  CREATININE  --  1.47*  --  1.52* 1.67*  --    Estimated Creatinine Clearance: 39.8 mL/min (by C-G formula based on SCr of 1.67 mg/dL (H)).  Assessment: 70yom on apixaban pta for afib (last dose 1/31 @ 2110), transitioned to IV heparin for LVAD placement (2/9).   Patient now POD #15 on warfarin.  Bivalirudin off as negative SRA.  Heparin level therapeutic at 0.22 (aiming for low goal of 0.2-0.3) after slight increase overnight to  1200 units/hr. No bleeding noted. CBC with slow down trend: HgB 9.7 and platelets 123.   INR 1.6 with slow upward trend. Will repeat boosted warfarin dose tonight. His requirements seem to be much high than when he was immediately post-op.   Goal of Therapy:  INR 2-2.5 Goal Heparin level 0.2-0.3 Monitor platelets by anticoagulation protocol: Yes   Plan:  -Continue Heparin at 1200 units/hr - aiming for low narrow goal -Warfarin 10mg  tonight to try and expedite getting off heparin -Monitor for s/sx of bleeding  Georga Bora, PharmD Clinical Pharmacist Pager: [phone] 12/29/2016 2:57 PM

## 2016-12-29 NOTE — Progress Notes (Signed)
Dressing changed with pt's sister [name]. [name] appropriately removed donned mask/cap/gloves, removed old dressing, donned sterile gloves, and changed dressing maintaining sterile technique. [name] followed directions given in the kit - only required prompting for the silver strip.   Fritz Pickerel, RN

## 2016-12-29 NOTE — Progress Notes (Signed)
Called into pt room. Pt feeling "woozy" in his head. Denies increased pain at site. CBG 146. MAP 83. O2 92% - pt placed in 2L [place] - O2 increased to 98%. Dr. Jeffie Pollock paged and made aware - given verbal order for CBC - verbal order to administer 500cc of fluid if pt continues to feel poorly. Oncoming shift RN and charge RN made aware.  Fritz Pickerel, RN

## 2016-12-29 NOTE — Progress Notes (Signed)
ANTICOAGULATION CONSULT NOTE - Follow Up Consult  Pharmacy Consult for heparin Indication: Afib/LVAD  Labs:  Recent Labs  12/27/16 0515 12/27/16 0519 12/27/16 0851 12/28/16 0349 12/28/16 0431 12/29/16 0448  HGB 8.5*  --  8.1*  --  7.8*  --   HCT 26.3*  --  25.4*  --  23.9*  --   PLT 179  --  166  --  154  --   LABPROT  --  17.2*  --   --  17.5* 19.2*  INR  --  1.39  --   --  1.42 1.60  HEPARINUNFRC  --  0.30  --  0.27*  --  0.16*  CREATININE  --   --  1.47*  --  1.52* 1.67*    Assessment: 71yo male now subtherapeutic on heparin after two levels at goal though had been trending down, no gtt issues or signs of bleeding per RN; pt has low, narrow goal.  Goal of Therapy:  Heparin level 0.2-0.3 units/ml   Plan:  Will increase heparin gtt slightly to 1200 units/hr as was previously above goal at this rate but this is the closest rate to change to; MD asked for minimal level checks but will check one in 8hr to ensure level is not still dropping.  Wynona Neat, PharmD, BCPS  12/29/2016,5:52 AM

## 2016-12-30 ENCOUNTER — Inpatient Hospital Stay (HOSPITAL_COMMUNITY): Payer: Medicare Other

## 2016-12-30 LAB — CBC
HEMATOCRIT: 23.6 % — AB (ref 39.0–52.0)
HEMOGLOBIN: 7.8 g/dL — AB (ref 13.0–17.0)
MCH: 30.4 pg (ref 26.0–34.0)
MCHC: 33.1 g/dL (ref 30.0–36.0)
MCV: 91.8 fL (ref 78.0–100.0)
Platelets: 145 10*3/uL — ABNORMAL LOW (ref 150–400)
RBC: 2.57 MIL/uL — AB (ref 4.22–5.81)
RDW: 17.6 % — ABNORMAL HIGH (ref 11.5–15.5)
WBC: 21.6 10*3/uL — AB (ref 4.0–10.5)

## 2016-12-30 LAB — GLUCOSE, CAPILLARY
GLUCOSE-CAPILLARY: 158 mg/dL — AB (ref 65–99)
GLUCOSE-CAPILLARY: 178 mg/dL — AB (ref 65–99)
GLUCOSE-CAPILLARY: 168 mg/dL — AB (ref 65–99)
Glucose-Capillary: 143 mg/dL — ABNORMAL HIGH (ref 65–99)

## 2016-12-30 LAB — PROTIME-INR
INR: 1.81
Prothrombin Time: 21.2 seconds — ABNORMAL HIGH (ref 11.4–15.2)

## 2016-12-30 LAB — BASIC METABOLIC PANEL
Anion gap: 12 (ref 5–15)
BUN: 51 mg/dL — ABNORMAL HIGH (ref 6–20)
CO2: 31 mmol/L (ref 22–32)
Calcium: 8.7 mg/dL — ABNORMAL LOW (ref 8.9–10.3)
Chloride: 83 mmol/L — ABNORMAL LOW (ref 101–111)
Creatinine, Ser: 1.78 mg/dL — ABNORMAL HIGH (ref 0.61–1.24)
GFR calc Af Amer: 43 mL/min — ABNORMAL LOW (ref >60)
GFR calc non Af Amer: 37 mL/min — ABNORMAL LOW (ref >60)
GLUCOSE: 152 mg/dL — AB (ref 65–99)
POTASSIUM: 4.5 mmol/L (ref 3.5–5.1)
Sodium: 126 mmol/L — ABNORMAL LOW (ref 135–145)

## 2016-12-30 LAB — COOXEMETRY PANEL
Carboxyhemoglobin: 1.9 % — ABNORMAL HIGH (ref 0.5–1.5)
Methemoglobin: 1.1 % (ref 0.0–1.5)
O2 Saturation: 52.7 %
Total hemoglobin: 11.5 g/dL — ABNORMAL LOW (ref 12.0–16.0)

## 2016-12-30 LAB — SODIUM
SODIUM: 127 mmol/L — AB (ref 135–145)
Sodium: 125 mmol/L — ABNORMAL LOW (ref 135–145)
Sodium: 129 mmol/L — ABNORMAL LOW (ref 135–145)

## 2016-12-30 LAB — PREPARE RBC (CROSSMATCH)

## 2016-12-30 LAB — HEPARIN LEVEL (UNFRACTIONATED): Heparin Unfractionated: 0.29 IU/mL — ABNORMAL LOW (ref 0.30–0.70)

## 2016-12-30 LAB — LACTATE DEHYDROGENASE: LDH: 356 U/L — ABNORMAL HIGH (ref 98–192)

## 2016-12-30 MED ORDER — GABAPENTIN 600 MG PO TABS
300.0000 mg | ORAL_TABLET | Freq: Two times a day (BID) | ORAL | Status: DC
  Administered 2016-12-30 – 2017-01-01 (×5): 300 mg via ORAL
  Filled 2016-12-30 (×4): qty 1

## 2016-12-30 MED ORDER — TOLVAPTAN 15 MG PO TABS
15.0000 mg | ORAL_TABLET | Freq: Once | ORAL | Status: AC
  Administered 2016-12-30: 15 mg via ORAL
  Filled 2016-12-30: qty 1

## 2016-12-30 MED ORDER — WARFARIN SODIUM 10 MG PO TABS
10.0000 mg | ORAL_TABLET | Freq: Once | ORAL | Status: AC
  Administered 2016-12-30: 10 mg via ORAL
  Filled 2016-12-30: qty 1

## 2016-12-30 MED ORDER — TORSEMIDE 20 MG PO TABS
60.0000 mg | ORAL_TABLET | Freq: Every day | ORAL | Status: DC
  Administered 2016-12-30 – 2017-01-01 (×3): 60 mg via ORAL
  Filled 2016-12-30 (×3): qty 3

## 2016-12-30 MED ORDER — SODIUM CHLORIDE 0.9 % IV SOLN: Freq: Once | INTRAVENOUS | Status: DC

## 2016-12-30 NOTE — Progress Notes (Signed)
Physical Therapy Treatment Patient Details Name: Trevor [name] MRN: [id number] DOB: November 24, 1945 Today's Date: 12/30/2016    History of Present Illness  71 y.o.male with ischemic cardiomyopathy, severe tricuspid regurgitation, ejection fraction 20% and chronic renal insufficiency, CHF, CKD, DM2, asthma. The patient was in cardiogenic shock with severe volume overload and deterioration in renal function on admission s/p LVAD Heartmate II implant 2/9     PT Comments    Pt progressing well toward goals and demonstrates improved activity tolerance by requiring less physical assist and requiring fewer rest breaks during activity. Pt demonstrates improved cognition and is able to verbalize and demonstrate proper LVAD transfers to/from wall and battery and preparation of equipment for ambulation. Pt would continue to benefit from current d/c plan. Will continue to follow.  Pre-ambulation flow: 5.8, post-ambulation flow 5.9    Follow Up Recommendations  Home health PT;Supervision for mobility/OOB     Equipment Recommendations  Rolling walker with 5" wheels    Recommendations for Other Services       Precautions / Restrictions Precautions Precautions: Sternal;Fall Precaution Comments: reviewed sternal precautions; LVAD Restrictions Weight Bearing Restrictions: Yes Other Position/Activity Restrictions: sternal precautions    Mobility  Bed Mobility               General bed mobility comments: pt seated EOB upon PT arrival  Transfers Overall transfer level: Needs assistance Equipment used: None Transfers: Sit to/from Stand Sit to Stand: Supervision         General transfer comment: increased time; supervision for safety  Ambulation/Gait Ambulation/Gait assistance: Min guard Ambulation Distance (Feet): 300 Feet Assistive device: Rolling walker (2 wheeled) Gait Pattern/deviations: Step-through pattern;Decreased stride length;Trunk flexed Gait velocity:  decreased Gait velocity interpretation: Below normal speed for age/gender General Gait Details: 1 standing rest break after 150' with v/c for upright posture and proper RW use   Stairs            Wheelchair Mobility    Modified Rankin (Stroke Patients Only)       Balance Overall balance assessment: Needs assistance Sitting-balance support: No upper extremity supported;Feet supported Sitting balance-[name]: Good Sitting balance - Comments: sat EOB x 5 min without LOB   Standing balance support: No upper extremity supported Standing balance-[name]: Fair Standing balance comment: not reliant upon RW for static standing balance but relies upon RW for ambulation                    Cognition Arousal/Alertness: Awake/alert Behavior During Therapy: WFL for tasks assessed/performed Overall Cognitive Status: Within Functional Limits for tasks assessed                 General Comments: able to recall all sternal precautions; able to verbalize need to transfer from wall power to battery power and the steps to do so; able to recall needed LVAD equipment to take on walk    Exercises      General Comments General comments (skin integrity, edema, etc.): drive line intact      Pertinent Vitals/Pain Pain Assessment: 0-10 Pain Score: 7  Pain Location: incision Pain Descriptors / Indicators: Discomfort Pain Intervention(s): Limited activity within patient's tolerance;Monitored during session    Home Living                      Prior Function            PT Goals (current goals can now be found in the care plan section)  Acute Rehab PT Goals Patient Stated Goal: I don't want to stop as many times while walking Progress towards PT goals: Progressing toward goals    Frequency    Min 3X/week      PT Plan Current plan remains appropriate    Co-evaluation             End of Session   Activity Tolerance: Patient tolerated treatment  well Patient left: in bed;with call bell/phone within reach Nurse Communication: Mobility status PT Visit Diagnosis: Unsteadiness on feet (R26.81);Other abnormalities of gait and mobility (R26.89);Muscle weakness (generalized) (M62.81);Pain Pain - part of body:  (chest)     Time: 0140-0206 PT Time Calculation (min) (ACUTE ONLY): 26 min  Charges:  $Gait Training: 8-22 mins $Therapeutic Activity: 8-22 mins                    G Codes:       Tracie Harrier 10-Jan-2017, 3:45 PM  Tracie Harrier, SPT Acute Rehab SPT [phone]

## 2016-12-30 NOTE — Progress Notes (Signed)
Patient ID: Trevor [name], male   DOB: 1946/04/15, 71 y.o.   MRN: [id number]    Advanced Heart Failure Rounding Note   Subjective:    Admitted with marked volume overload and low output HF.  Echo EF 20-25%. Failed dual inotropes. IABP placed 2/7.   Underwent HM-II placement and TV repair on 2/9.   1 unit PRBCs 2/12.   Respiratory cultures with Pseudomonas, meropenem course completed.  Acinetobacter also isolated, discussed with ID and recommended not treating this since extubated.    Yesterday hgb 7.1-->received 1UPRBCs. Today CO-OX is down to 53%. Sodium down to 126. Walked 3 times yesterday.   Denies SOB. Pain controlled.   LVAD parameters:   Speed 9000  Flow 5.9  PI 3.8 Power 5.6  Rare PI events.   Objective:   Weight Range:  Vital Signs:   Temp:  [97.8 F (36.6 C)-99.4 F (37.4 C)] 98.1 F (36.7 C) (02/26 0540) Pulse Rate:  [72-104] 77 (02/26 0540) Resp:  [16-18] 18 (02/26 0540) SpO2:  [93 %-100 %] 93 % (02/26 0540) Weight:  [196 lb (88.9 kg)] 196 lb (88.9 kg) (02/26 0540) Last BM Date: 12/28/16  Weight change: Filed Weights   12/28/16 0457 12/29/16 0532 12/30/16 0540  Weight: 190 lb 6.4 oz (86.4 kg) 194 lb 3.2 oz (88.1 kg) 196 lb (88.9 kg)    Intake/Output:   Intake/Output Summary (Last 24 hours) at 12/30/16 0728 Last data filed at 12/30/16 0530  Gross per 24 hour  Intake          1226.33 ml  Output              600 ml  Net           626.33 ml    MAPs 70-80ss  Physical Exam: General:  NAD. Sitting on the side of the bed.   HEENT: normal   Neck: supple, JVP 7 Cor: Sternal wounds ok LVAD hum. irregular  Lungs:  Decreased in the bases. On room air.   Abdomen: obese soft, NT. Normal BS. Driveline site ok Extremities: no cyanosis, clubbing, rash.  2 edema + TED Neuro: Alert/oriented.    Telemetry: AF 80-90s. Personally reviewed   Labs: Basic Metabolic Panel:  Recent Labs Lab 12/26/16 0851 12/27/16 0851 12/28/16 0431 12/29/16 0448  12/30/16 0449  NA 130* 132* 131* 128* 126*  K 4.0 4.3 4.3 4.8 4.5  CL 83* 83* 85* 83* 83*  CO2 35* 37* 37* 34* 31  GLUCOSE 189* 150* 122* 124* 152*  BUN 46* 46* 47* 48* 51*  CREATININE 1.47* 1.47* 1.52* 1.67* 1.78*  CALCIUM 8.6* 8.8* 8.8* 8.7* 8.7*    Liver Function Tests:  Recent Labs Lab 12/24/16 0408 12/25/16 0442  AST 59* 44*  ALT 49 40  ALKPHOS 149* 136*  BILITOT 1.5* 1.0  PROT 5.9* 5.8*  ALBUMIN 2.6* 2.6*   No results for input(s): LIPASE, AMYLASE in the last 168 hours. No results for input(s): AMMONIA in the last 168 hours.  CBC:  Recent Labs Lab 12/27/16 0851 12/28/16 0431 12/29/16 0448 12/29/16 1928 12/30/16 0449  WBC 19.7* 17.3* 12.9* 23.7* 21.6*  HGB 8.1* 7.8* 9.7* 7.1* 7.8*  HCT 25.4* 23.9* 29.9* 21.6* 23.6*  MCV 93.4 93.0 92.9 91.9 91.8  PLT 166 154 123* 166 145*    Cardiac Enzymes: No results for input(s): CKTOTAL, CKMB, CKMBINDEX, TROPONINI in the last 168 hours.  BNP: BNP (last 3 results)  Recent Labs  11/28/16 1104 12/14/16 0348  BNP 1,825.3*  427.7*    ProBNP (last 3 results)  Recent Labs  11/27/16 1207  PROBNP 1,746.0*      Other results:  Imaging: No results found.   Medications:     Scheduled Medications: . arformoterol  15 mcg Nebulization BID  . aspirin  81 mg Oral Daily  . bisacodyl  10 mg Oral Daily   Or  . bisacodyl  10 mg Rectal Daily  . budesonide (PULMICORT) nebulizer solution  0.5 mg Nebulization BID  . busPIRone  5 mg Oral BID  . docusate sodium  200 mg Oral Daily  . feeding supplement (GLUCERNA SHAKE)  237 mL Oral BID BM  . ferrous Q000111Q C-folic acid  1 capsule Oral BID  . fluticasone  1 spray Each Nare Daily  . furosemide  80 mg Oral Daily  . gabapentin  300 mg Oral Daily  . guaiFENesin  600 mg Oral BID  . insulin aspart  0-15 Units Subcutaneous TID WC  . insulin aspart  0-5 Units Subcutaneous QHS  . levothyroxine  25 mcg Oral QAC breakfast  . mouth rinse  15 mL Mouth Rinse BID   . pantoprazole  40 mg Oral Q1200  . sildenafil  20 mg Oral TID  . sodium chloride flush  10-40 mL Intracatheter Q12H  . sorbitol  30 mL Oral Once  . umeclidinium bromide  1 puff Inhalation Daily  . warfarin   Does not apply Once  . Warfarin - Pharmacist Dosing Inpatient   Does not apply q1800    Infusions: . sodium chloride    . sodium chloride 20 mL/hr at 12/23/16 0700  . heparin 1,200 Units/hr (12/30/16 [id number])    PRN Medications: Place/Maintain arterial line **AND** sodium chloride, acetaminophen, levalbuterol, ondansetron (ZOFRAN) IV, oxyCODONE, sodium chloride flush, traMADol, traZODone   Assessment/Plan/Discussion    1. Acute on chronic systolic CHF: Ischemic cardiomyopathy but with prominent RV dysfunction on echo and exam.  [employer] ICD.  Echo this admission showed EF 20-25% with moderately dilated LV and moderately dilated/moderately dysfunctional RV with severe TR.  He was admitted with low output biventricular failure, cardiorenal syndrome, and marked volume overload. Failed dual inotrope support. IABP placed 2/7. HM-II and TV repair performed 2/9 after discussion with [facility] transplant team. POD #3 HM-II and TV repair.  Extubated 2/11, off NO, vasopressin, and epinephrine. Speed decreased to 8800 rpm after ramp echo on 2/16 for RV support but increased back to 9000 to help get him off norepinephrine.  Off norepi and milrinone.  Volume status elevated. Creatinine up to 1.78.   - Stop lasix and start torsemide 60 mg daily.  Will also get a dose of tolvaptan today.    - CO-OX 53%.  Repeat in am, would not restart milrinone.  - Continue LVAD speed at 9000. - On Sildenafil 20 mg tid for RV failure.   - Continue warfarin, ASA 81 daily.  INR 1.8. On heparin. Will need pacing wires out when off heparin gtt.  2. CAD: s/p CABG.  No signs/sx of ischemia.  3. AKI on CKD stage III: Suspect cardiorenal syndrome in setting of low output failure/RV failure.  Creatinine trending up. 1.51->  1.67->1.78.  4. Atrial fibrillation: Chronic.  Rate is controlled 5. Post-op anemia: Also component of anemia of renal disease/chronic disease.  He got 1 unit PRBCs 2/12, appropriate increase in hgb.  Yesterday Hgb 7.1 received 1UPRBC.  - Today's Hgb 7.8. Give 1 UPRBCs.  6. Acute liver failure, post-op: Bilirubin improved. Likely hypoperfusion. Holding hepato-toxic  agents.  7. Aortic stenosis s/p TAVR: Stable on echo.  8. ID: Afebrile.  WBCs 21.6, down from 23 yesterday. Afebrile Pseudomonas in respiratory culture, suspect tracheobronchitis.  He completed a course of meropenem and is now off abx. Acinetobacter also noted, discussed with ID and not treating at this time. UA 2/23 is negative.  - Repeat CXR.  9. Thrombocytopenia: Platelets 145k. 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  10. Hyponatremia: 126 today.  Give dose of tolvaptan given volume overload.    11. Pocket site pain: Still significant at night. Required an extra dose last night. Will make gabapentin bid.    HH has been set up.   Darrick Grinder, NP  12/30/2016, 7:28 AM  Advanced Heart Failure Team Pager [phone] (M-F; 7a - 4p)  Please contact [facility] for night-coverage after hours (4p -7a ) and weekends on [email or web address]  Patient seen with NP, agree with the above note.  Co-ox a bit lower and creatinine is a bit higher.  He still has volume overload on exam. - Would transition diuretic to torsemide 60 mg daily.  He will get a dose of tolvaptan today with Na 126 and need for diuresis. - Leave off milrinone, co-ox marginal this am but has been stable the few days prior.  - Agree with transfusion of 1 unit PRBCs.  No overt bleeding. - Continue to monitor elevated WBCs.  He has completed meropenem course.  He is afebrile.  Repeat CXR today.  - Pacing wires out when INR therapeutic and he can come off heparin gtt.   Hopefully home by mid week. Still some work to do.   Loralie Champagne 12/30/2016 9:11 AM

## 2016-12-30 NOTE — Progress Notes (Signed)
ANTICOAGULATION CONSULT NOTE - Follow Up Consult  Pharmacy Consult for heparin while INR subtherapeutic Indication: atrial fibrillation/LVAD  Allergies  Allergen Reactions  . No Known Allergies    Patient Measurements: Height: 5\' 3"  (160 cm) Weight: 196 lb (88.9 kg) IBW/kg (Calculated) : 56.9  Heparin dosing weight: 76kg  Vital Signs: Temp: 98.7 F (37.1 C) (02/26 1134) Temp Source: Oral (02/26 1134) BP: 84/66 (02/26 1134) Pulse Rate: 81 (02/26 1134)  Labs:  Recent Labs  12/28/16 0431 12/29/16 0448 12/29/16 1329 12/29/16 1928 12/30/16 0449  HGB 7.8* 9.7*  --  7.1* 7.8*  HCT 23.9* 29.9*  --  21.6* 23.6*  PLT 154 123*  --  166 145*  LABPROT 17.5* 19.2*  --   --  21.2*  INR 1.42 1.60  --   --  1.81  HEPARINUNFRC  --  0.16* 0.22*  --  0.29*  CREATININE 1.52* 1.67*  --   --  1.78*   Estimated Creatinine Clearance: 37.5 mL/min (by C-G formula based on SCr of 1.78 mg/dL (H)).  Assessment: 70yom on apixaban pta for afib (last dose 1/31 @ 2110), transitioned to IV heparin for LVAD placement (2/9).   Patient now POD #16 on warfarin.  Bivalirudin off as negative SRA.  Heparin level therapeutic at 0.29 (aiming for low goal of 0.2-0.3). No bleeding noted. CBC with slow down trend: HgB 7.8 and platelets 145.   INR 1.81 with slow upward trend. Will repeat boosted warfarin dose tonight. His requirements seem to be much high than when he was immediately post-op.   Goal of Therapy:  INR 2-2.5 Goal Heparin level 0.2-0.3 Monitor platelets by anticoagulation protocol: Yes   Plan:  -Continue Heparin at 1200 units/hr - aiming for low narrow goal -Warfarin 10mg  again tonight, may not need this much long-term. -Monitor for s/sx of bleeding  Uvaldo Rising, BCPS  Clinical Pharmacist Pager [phone]  12/30/2016 2:07 PM

## 2016-12-30 NOTE — Progress Notes (Signed)
Contacted by phlebotomy to draw Na+ level at 1906. Lab drawn and sent to lab processing. Fran Lowes, RN

## 2016-12-30 NOTE — Progress Notes (Signed)
CSW met at bedside with patient and VAD Coordinator. Patient states he had hoped to go home today but continues with some medical issues. Patient reports he is feeling improved and ready to go go home. Patient's sisters have been visiting daily and are very supportive to patient. Patient appears to be coping well under stressful event and feels supported by team and family. CSW will continue to follow for support and follow up post hospitalization through the VAD clinic. Raquel Sarna, [place], [place]

## 2016-12-30 NOTE — Progress Notes (Signed)
Pt. Refusing help with transferring from wall unit to batteries and vice. Pt. Hooked his black to white lead and takes prolonged time to unhook. Will continue to educate PT. And family

## 2016-12-30 NOTE — Progress Notes (Signed)
CARDIAC REHAB PHASE I   PRE:  Rate/Rhythm: 89 a fib c/ PVCs  BP:  Sitting: 94 MAP        SaO2:94 RA  MODE:  Ambulation: 360 ft   POST:  Rate/Rhythm: 110   BP:  Sitting: 86 MAP         SaO2: 95 RA  Pt in bed, states he is awaiting a chest xray and a blood transfusion, agreeable to walk. Pt able to don batteries independently. Pt ambulated 360 ft on RA, IV, rolling walker, assist x1, slow, steady gait, tolerated well. Pt c/o mild DOE, fatigue with distance, brief standing rest x2. Also c/o shoulder burning upon return to room. Pt able to return to wall power source independently. Pt to recliner after walk, feet elevated, call bell within reach. Will follow.   [id number] Lenna Sciara, RN, BSN 12/30/2016 10:58 AM

## 2016-12-31 ENCOUNTER — Ambulatory Visit: Payer: Medicare Other | Admitting: Emergency Medicine

## 2016-12-31 DIAGNOSIS — I5023 Acute on chronic systolic (congestive) heart failure: Secondary | ICD-10-CM

## 2016-12-31 DIAGNOSIS — Z95811 Presence of heart assist device: Secondary | ICD-10-CM

## 2016-12-31 LAB — TYPE AND SCREEN
ABO/RH(D): A NEG
ANTIBODY SCREEN: NEGATIVE
UNIT DIVISION: 0
Unit division: 0
Unit division: 0

## 2016-12-31 LAB — PROTIME-INR
INR: 2.25
Prothrombin Time: 25.3 seconds — ABNORMAL HIGH (ref 11.4–15.2)

## 2016-12-31 LAB — CBC
HCT: 25.4 % — ABNORMAL LOW (ref 39.0–52.0)
Hemoglobin: 8.4 g/dL — ABNORMAL LOW (ref 13.0–17.0)
MCH: 30.1 pg (ref 26.0–34.0)
MCHC: 33.1 g/dL (ref 30.0–36.0)
MCV: 91 fL (ref 78.0–100.0)
PLATELETS: 149 10*3/uL — AB (ref 150–400)
RBC: 2.79 MIL/uL — ABNORMAL LOW (ref 4.22–5.81)
RDW: 17.8 % — AB (ref 11.5–15.5)
WBC: 15.9 10*3/uL — ABNORMAL HIGH (ref 4.0–10.5)

## 2016-12-31 LAB — HEPARIN LEVEL (UNFRACTIONATED): HEPARIN UNFRACTIONATED: 0.14 [IU]/mL — AB (ref 0.30–0.70)

## 2016-12-31 LAB — GLUCOSE, CAPILLARY
GLUCOSE-CAPILLARY: 164 mg/dL — AB (ref 65–99)
Glucose-Capillary: 142 mg/dL — ABNORMAL HIGH (ref 65–99)
Glucose-Capillary: 168 mg/dL — ABNORMAL HIGH (ref 65–99)
Glucose-Capillary: 208 mg/dL — ABNORMAL HIGH (ref 65–99)
Glucose-Capillary: 188 mg/dL — ABNORMAL HIGH (ref 65–99)

## 2016-12-31 LAB — BASIC METABOLIC PANEL WITH GFR
Anion gap: 12 (ref 5–15)
BUN: 49 mg/dL — ABNORMAL HIGH (ref 6–20)
CO2: 32 mmol/L (ref 22–32)
Calcium: 8.8 mg/dL — ABNORMAL LOW (ref 8.9–10.3)
Chloride: 86 mmol/L — ABNORMAL LOW (ref 101–111)
Creatinine, Ser: 1.73 mg/dL — ABNORMAL HIGH (ref 0.61–1.24)
GFR calc Af Amer: 44 mL/min — ABNORMAL LOW (ref >60)
GFR calc non Af Amer: 38 mL/min — ABNORMAL LOW (ref >60)
Glucose, Bld: 146 mg/dL — ABNORMAL HIGH (ref 65–99)
Potassium: 3.7 mmol/L (ref 3.5–5.1)
Sodium: 130 mmol/L — ABNORMAL LOW (ref 135–145)

## 2016-12-31 LAB — LACTATE DEHYDROGENASE: LDH: 335 U/L — ABNORMAL HIGH (ref 98–192)

## 2016-12-31 LAB — COOXEMETRY PANEL
Carboxyhemoglobin: 2.4 % — ABNORMAL HIGH (ref 0.5–1.5)
Methemoglobin: 1.2 % (ref 0.0–1.5)
O2 Saturation: 60.9 %
Total hemoglobin: 8.6 g/dL — ABNORMAL LOW (ref 12.0–16.0)

## 2016-12-31 MED ORDER — WARFARIN SODIUM 5 MG PO TABS
5.0000 mg | ORAL_TABLET | Freq: Once | ORAL | Status: AC
Start: 2016-12-31 — End: 2016-12-31
  Administered 2016-12-31: 5 mg via ORAL
  Filled 2016-12-31: qty 1

## 2016-12-31 NOTE — Progress Notes (Signed)
CARDIAC REHAB PHASE I   PRE:  Rate/Rhythm: 97 afib    BP: sitting 70    SaO2: 93 RA  MODE:  Ambulation: 620 ft   POST:  Rate/Rhythm: 97 afib    BP: sitting 80     SaO2: 93 RA  Pt very eager to walk as he hasn't yet today. He was able to change from power cord to batteries without fault. He had a new, concise and organized method and he talked himself through it, communicating each step. Pt moving independently with rollator. He liked using rollator today and increased distance by almost 300 ft. Standing rest x2. To recliner after walk and was able to change back to power cord in the same fashion, no problems. Pt is eager to walk more and I gave him permission to walk independently or with family if his RN can supervise power change beforehand.  [place], ACSM 12/31/2016 2:15 PM

## 2016-12-31 NOTE — Progress Notes (Signed)
ANTICOAGULATION CONSULT NOTE - Follow Up Consult  Pharmacy Consult for Coumadin Indication: atrial fibrillation/LVAD  Allergies  Allergen Reactions  . No Known Allergies     Patient Measurements: Height: 5\' 3"  (160 cm) Weight: 194 lb 3.2 oz (88.1 kg) IBW/kg (Calculated) : 56.9  Vital Signs: Temp: 97.5 F (36.4 C) (02/27 0430) Temp Source: Oral (02/27 0430) Pulse Rate: 82 (02/27 0430)  Labs:  Recent Labs  12/29/16 0448 12/29/16 1329 12/29/16 1928 12/30/16 0449 12/31/16 0456 12/31/16 0810  HGB 9.7*  --  7.1* 7.8* 8.4*  --   HCT 29.9*  --  21.6* 23.6* 25.4*  --   PLT 123*  --  166 145* 149*  --   LABPROT 19.2*  --   --  21.2*  --  25.3*  INR 1.60  --   --  1.81  --  2.25  HEPARINUNFRC 0.16* 0.22*  --  0.29*  --  0.14*  CREATININE 1.67*  --   --  1.78* 1.73*  --     Estimated Creatinine Clearance: 38.4 mL/min (by C-G formula based on SCr of 1.73 mg/dL (H)).   Medications:  Heparin @ 1200 units/hr  Assessment: 70yom on apixaban pta for afib (last dose 1/31 @ 2110), transitioned to IV heparin for LVAD placement (2/9). Started on bivalirudin post-op as there was concern for HIT, however, SRA negative SRA so he was switched to heparin. Coumadin started 2/11.  Today's INR is at goal 2.25. Ok to discontinue heparin per [name]. Hgb low but stable s/p 2 units PRBCs, LDH stable.  Goal of Therapy:  INR 2-2.5 Monitor platelets by anticoagulation protocol: Yes   Plan:  1) Coumadin 5mg  x 1 2) Daily INR  Deboraha Sprang 12/31/2016,9:48 AM

## 2016-12-31 NOTE — Progress Notes (Signed)
Occupational Therapy Treatment Patient Details Name: Trevor [name] MRN: [id number] DOB: [birth year] Today's Date: 12/31/2016    History of present illness  71 y.o.male with ischemic cardiomyopathy, severe tricuspid regurgitation, ejection fraction 20% and chronic renal insufficiency, CHF, CKD, DM2, asthma. The patient was in cardiogenic shock with severe volume overload and deterioration in renal function on admission s/p LVAD Heartmate II implant 2/9    OT comments  Pt verbalizing awareness of difficulty he is having with power source changing. Demonstrates decreased ability to recall energy conservation strategies as he has been instructed as well, reinforced. Pt hopeful to go home tomorrow with his sisters.   Follow Up Recommendations  No OT follow up    Equipment Recommendations  None recommended by OT    Recommendations for Other Services      Precautions / Restrictions Precautions Precautions: Sternal;Fall Precaution Comments: pt able to state and adhere to sternal precautions Restrictions Weight Bearing Restrictions: Yes Other Position/Activity Restrictions: sternal precautions       Mobility Bed Mobility               General bed mobility comments: pt seated EOB upon OT arrival  Transfers Overall transfer level: Needs assistance Equipment used: None Transfers: Sit to/from Stand Sit to Stand: Supervision         General transfer comment: increased time; supervision for safety    Balance     Sitting balance-[name]: Good       Standing balance-[name]: Fair Standing balance comment: used IV pole for stability with ambulation, no support need for standing ADL                   ADL Overall ADL's : Needs assistance/impaired     Grooming: Wash/dry hands;Oral care;Brushing hair;Standing               Lower Body Dressing: Supervision/safety;Sit to/from stand Lower Body Dressing Details (indicate cue type and reason):  educated in use of sock aide for compression socks as needed, sister has one pt can use at home Toilet Transfer: Min guard;Ambulation;Regular Toilet   Toileting- Water quality scientist and Hygiene: Supervision/safety;Sit to/from stand       Functional mobility during ADLs: Min guard General ADL Comments: continued to reinforce energy conservation strategies and technique for changing power sources      Vision                     Perception     Praxis      Cognition   Behavior During Therapy: [facility] for tasks assessed/performed Overall Cognitive Status: Impaired/Different from baseline Area of Impairment: Memory                General Comments: pt not able to recall energy conservation strategies without maximal verbal cues, continues to have difficulty with changing power sources, although he is aware, pt very short tempered with his sister      Exercises     Shoulder Instructions       General Comments      Pertinent Vitals/ Pain       Pain Assessment: No/denies pain  Home Living                                          Prior Functioning/Environment              Frequency  Min 2X/week        Progress Toward Goals  OT Goals(current goals can now be found in the care plan section)  Progress towards OT goals: Progressing toward goals  Acute Rehab OT Goals Patient Stated Goal: to walk Time For Goal Achievement: 01/07/17 Potential to Achieve Goals: Good  Plan Frequency needs to be updated    Co-evaluation                 End of Session    OT Visit Diagnosis: Unsteadiness on feet (R26.81);Muscle weakness (generalized) (M62.81)   Activity Tolerance Patient tolerated treatment well   Patient Left in bed;with call bell/phone within reach;with family/visitor present   Nurse Communication          Time: [id number] OT Time Calculation (min): 20 min  Charges: OT General Charges $OT Visit: 1 Procedure OT  Treatments $Self Care/Home Management : 8-22 mins   Malka So 12/31/2016, 11:48 AM [phone]

## 2016-12-31 NOTE — Progress Notes (Signed)
18 Days Post-Op Procedure(s) (LRB): INSERTION OF IMPLANTABLE LEFT VENTRICULAR ASSIST DEVICE (N/A) TRICUSPID VALVE REPAIR WITH EDWARDS MC 3 TRICUSPID ANNULOPLASTY RING MODEL 4900 SIZE T 28 (N/A) TRANSESOPHAGEAL ECHOCARDIOGRAM (TEE) (N/A) Subjective: Anticoagulation now adequate and heparin drip removed We'll remove epicardial pacing wires in a.m. Surgical incisions clean and dry VAD performance satisfactory Chest x-ray yesterday clear We'll remove chest tube sutures in first outpatient clinic visit  Objective: Vital signs in last 24 hours: Temp:  [97.5 F (36.4 C)-99.3 F (37.4 C)] 99.3 F (37.4 C) (02/27 1945) Pulse Rate:  [73-91] 73 (02/27 1945) Cardiac Rhythm: Atrial fibrillation;Bundle branch block (02/27 1900) Resp:  [18] 18 (02/27 1945) BP: (82-92)/(60-71) 82/60 (02/27 1945) SpO2:  [87 %-98 %] 97 % (02/27 1945) Weight:  [194 lb 3.2 oz (88.1 kg)] 194 lb 3.2 oz (88.1 kg) (02/27 0430)  Hemodynamic parameters for last 24 hours:  stable  Intake/Output from previous day: 02/26 0701 - 02/27 0700 In: 1600.8 [P.O.:600; I.V.:309.8; Blood:691] Out: 2900 [Urine:2900] Intake/Output this shift: No intake/output data recorded.    Lab Results:  Recent Labs  12/30/16 0449 12/31/16 0456  WBC 21.6* 15.9*  HGB 7.8* 8.4*  HCT 23.6* 25.4*  PLT 145* 149*   BMET:  Recent Labs  12/30/16 0449  12/30/16 2314 12/31/16 0456  NA 126*  < > 127* 130*  K 4.5  --   --  3.7  CL 83*  --   --  86*  CO2 31  --   --  32  GLUCOSE 152*  --   --  146*  BUN 51*  --   --  49*  CREATININE 1.78*  --   --  1.73*  CALCIUM 8.7*  --   --  8.8*  < > = values in this interval not displayed.  PT/INR:  Recent Labs  12/31/16 0810  LABPROT 25.3*  INR 2.25   ABG    Component Value Date/Time   PHART 7.357 12/17/2016 0415   HCO3 27.2 12/17/2016 0415   TCO2 29 12/19/2016 1458   O2SAT 60.9 12/31/2016 0500   CBG (last 3)   Recent Labs  12/31/16 1143 12/31/16 1622 12/31/16 1813  GLUCAP  168* 208* 164*    Assessment/Plan: S/P Procedure(s) (LRB): INSERTION OF IMPLANTABLE LEFT VENTRICULAR ASSIST DEVICE (N/A) TRICUSPID VALVE REPAIR WITH EDWARDS MC 3 TRICUSPID ANNULOPLASTY RING MODEL 4900 SIZE T 28 (N/A) TRANSESOPHAGEAL ECHOCARDIOGRAM (TEE) (N/A) Remove EPW's Wednesday morning after breakfast Patient okay for discharge Wednesday afternoon   LOS: 33 days    Tharon Aquas Trigt III 12/31/2016

## 2016-12-31 NOTE — Progress Notes (Signed)
LVAD Coordinator Education Note:   Spoke at length with patient about going home tomorrow and discussed any concerns/apprehensions. He reported to me that he was frustrated yesterday with controller connections and felt pressured to do it in a certain time.' Reinforced that it is more important to take time and always exchange ONE lead at a time. He verbalized he has been paying more attention to this today and was encouraged a lot from CR team. Independently performed power exchange without prompting.   He is nervous about the weeks at home to come since his sister is having hip surgery and he will need to assist with her care. He tells me that [name] (sister from [facility] will need to return home at some point in the next few weeks. I asked if she would be able to extend out her visit should he or his sister need her help. "Hasn't talked about it yet." I encouraged him to have discussions of these possible situations so we can formulate a plan as a team.   Will bring patient's equipment up to him tomorrow. Went over LVAD Patient/Caregiver Test in detail and he/his family have passed competency exam. We will perform dressing change prior to D/C Home tomorrow to assess before first clinic visit.   [name], RN VAD Coordinator   Office: [phone] 24/7 Emergency VAD Pager: [phone]

## 2016-12-31 NOTE — Progress Notes (Signed)
Patient ID: Trevor [name], male   DOB: 02/03/1946, 71 y.o.   MRN: [id number]    Advanced Heart Failure Rounding Note   Subjective:    Admitted with marked volume overload and low output HF.  Echo EF 20-25%. Failed dual inotropes. IABP placed 2/7.   Underwent HM-II placement and TV repair on 2/9.   1 unit PRBCs 2/12.   Respiratory cultures with Pseudomonas, meropenem course completed.  Acinetobacter also isolated, discussed with ID and recommended not treating this since extubated.    1UPRBC 2/25 7.1 1UPRBC 2/26 7.8  Yesterday hgb 7.8-->received 1UPRBCs. Today CO-OX 61%. Received tolvaptan sodium 125>130. Weight down 2 pounds.   Per nursing staff he had difficulty connecting to appropriate cord. He tried to connect black to white. Nursing staff re-educated.   Denies SOB. Feeling better.Marland Kitchen    LVAD parameters:   Speed 9000  Flow 5.7  PI 3.9 Power 6  Rare PI events.   Objective:   Weight Range:  Vital Signs:   Temp:  [97.5 F (36.4 C)-98.7 F (37.1 C)] 97.5 F (36.4 C) (02/27 0430) Pulse Rate:  [81-91] 82 (02/27 0430) Resp:  [18] 18 (02/27 0430) BP: (84-104)/(61-75) 104/75 (02/26 1413) SpO2:  [87 %-99 %] 95 % (02/27 0430) Weight:  [194 lb 3.2 oz (88.1 kg)] 194 lb 3.2 oz (88.1 kg) (02/27 0430) Last BM Date: 12/30/16  Weight change: Filed Weights   12/29/16 0532 12/30/16 0540 12/31/16 0430  Weight: 194 lb 3.2 oz (88.1 kg) 196 lb (88.9 kg) 194 lb 3.2 oz (88.1 kg)    Intake/Output:   Intake/Output Summary (Last 24 hours) at 12/31/16 0754 Last data filed at 12/31/16 0554  Gross per 24 hour  Intake           1600.8 ml  Output             2900 ml  Net          -1299.2 ml    MAPs 80s  Physical Exam: General:  NAD. Sitting on the side of the bed.   HEENT: normal   Neck: supple, JVP 8-9 Cor: Sternal wounds ok LVAD hum. irregular  Lungs:  Decreased in the bases. On room air.   Abdomen: obese soft, NT. Normal BS. Driveline site ok Extremities: no cyanosis,  clubbing, rash.  RLE and LLE 1+ edema + TED Neuro: Alert/oriented.    Telemetry: AF 80-90s. Personally reviewed   Labs: Basic Metabolic Panel:  Recent Labs Lab 12/27/16 0851 12/28/16 0431 12/29/16 0448 12/30/16 0449 12/30/16 0921 12/30/16 1910 12/30/16 2314 12/31/16 0456  NA 132* 131* 128* 126* 125* 129* 127* 130*  K 4.3 4.3 4.8 4.5  --   --   --  3.7  CL 83* 85* 83* 83*  --   --   --  86*  CO2 37* 37* 34* 31  --   --   --  32  GLUCOSE 150* 122* 124* 152*  --   --   --  146*  BUN 46* 47* 48* 51*  --   --   --  49*  CREATININE 1.47* 1.52* 1.67* 1.78*  --   --   --  1.73*  CALCIUM 8.8* 8.8* 8.7* 8.7*  --   --   --  8.8*    Liver Function Tests:  Recent Labs Lab 12/25/16 0442  AST 44*  ALT 40  ALKPHOS 136*  BILITOT 1.0  PROT 5.8*  ALBUMIN 2.6*   No results for input(s): LIPASE,  AMYLASE in the last 168 hours. No results for input(s): AMMONIA in the last 168 hours.  CBC:  Recent Labs Lab 12/28/16 0431 12/29/16 0448 12/29/16 1928 12/30/16 0449 12/31/16 0456  WBC 17.3* 12.9* 23.7* 21.6* 15.9*  HGB 7.8* 9.7* 7.1* 7.8* 8.4*  HCT 23.9* 29.9* 21.6* 23.6* 25.4*  MCV 93.0 92.9 91.9 91.8 91.0  PLT 154 123* 166 145* 149*    Cardiac Enzymes: No results for input(s): CKTOTAL, CKMB, CKMBINDEX, TROPONINI in the last 168 hours.  BNP: BNP (last 3 results)  Recent Labs  11/28/16 1104 12/14/16 0348  BNP 1,825.3* 427.7*    ProBNP (last 3 results)  Recent Labs  11/27/16 1207  PROBNP 1,746.0*      Other results:  Imaging: Dg Chest 2 View  Result Date: 12/30/2016 CLINICAL DATA:  Pneumonia. EXAM: CHEST  2 VIEW COMPARISON:  12/25/2016 FINDINGS: Post median sternotomy. Single lead right-sided pacemaker in place. Left ventricular assist device again seen. Tip of the left upper extremity PICC in the SVC. Low lung volumes persist. Stable cardiomegaly with prosthetic valves. Small pleural effusions persists, but decreased in the interval. Decreasing left midlung  opacity. No new airspace disease. No pulmonary edema. No pneumothorax. Unchanged osseous structures. IMPRESSION: 1. Improving small bilateral pleural effusions. Improving left midlung opacity which may be in part fluid in the fissure. 2. Otherwise stable exam. Electronically Signed   By: Jeb Levering M.D.   On: 12/30/2016 18:27     Medications:     Scheduled Medications: . sodium chloride   Intravenous Once  . arformoterol  15 mcg Nebulization BID  . aspirin  81 mg Oral Daily  . bisacodyl  10 mg Oral Daily   Or  . bisacodyl  10 mg Rectal Daily  . budesonide (PULMICORT) nebulizer solution  0.5 mg Nebulization BID  . busPIRone  5 mg Oral BID  . docusate sodium  200 mg Oral Daily  . feeding supplement (GLUCERNA SHAKE)  237 mL Oral BID BM  . ferrous Q000111Q C-folic acid  1 capsule Oral BID  . fluticasone  1 spray Each Nare Daily  . gabapentin  300 mg Oral BID  . guaiFENesin  600 mg Oral BID  . insulin aspart  0-15 Units Subcutaneous TID WC  . insulin aspart  0-5 Units Subcutaneous QHS  . levothyroxine  25 mcg Oral QAC breakfast  . mouth rinse  15 mL Mouth Rinse BID  . pantoprazole  40 mg Oral Q1200  . sildenafil  20 mg Oral TID  . sodium chloride flush  10-40 mL Intracatheter Q12H  . sorbitol  30 mL Oral Once  . torsemide  60 mg Oral Daily  . umeclidinium bromide  1 puff Inhalation Daily  . warfarin   Does not apply Once  . Warfarin - Pharmacist Dosing Inpatient   Does not apply q1800    Infusions: . sodium chloride    . sodium chloride 20 mL/hr at 12/23/16 0700  . heparin 1,200 Units/hr (12/31/16 0533)    PRN Medications: Place/Maintain arterial line **AND** sodium chloride, acetaminophen, levalbuterol, ondansetron (ZOFRAN) IV, oxyCODONE, sodium chloride flush, traMADol, traZODone   Assessment/Plan/Discussion    1. Acute on chronic systolic CHF: Ischemic cardiomyopathy but with prominent RV dysfunction on echo and exam.  Medtronic ICD.  Echo this  admission showed EF 20-25% with moderately dilated LV and moderately dilated/moderately dysfunctional RV with severe TR.  He was admitted with low output biventricular failure, cardiorenal syndrome, and marked volume overload. Failed dual inotrope support. IABP  placed 2/7. HM-II and TV repair performed 2/9 after discussion with [facility] transplant team. POD #3 HM-II and TV repair.  Extubated 2/11, off NO, vasopressin, and epinephrine. Speed decreased to 8800 rpm after ramp echo on 2/16 for RV support but increased back to 9000 to help get him off norepinephrine.  Off norepi and milrinone.  Volume status improving. Creatinine stable at 1.73.  Co-ox 61%.    - Continue torsemide 60 mg daily.   - Continue LVAD speed at 9000. - On Sildenafil 20 mg tid for RV failure.   - Continue warfarin, ASA 81 daily.  INR pending. On heparin. Will need pacing wires out when off heparin gtt.  2. CAD: s/p CABG.  No signs/sx of ischemia.  3. AKI on CKD stage III: Suspect cardiorenal syndrome in setting of low output failure/RV failure.  Creatinine plateaued 1.78 > 1.73  4. Atrial fibrillation: Chronic.  Rate is controlled 5. Post-op anemia: Also component of anemia of renal disease/chronic disease.  Hgb with appropriate bump after 1 unit PRBCs yesterday.  No overt bleeding.     6. Acute liver failure, post-op: Bilirubin improved. Likely hypoperfusion. Holding hepato-toxic agents.  7. Aortic stenosis s/p TAVR: Stable on echo.  8. ID: Afebrile.  WBCs 21.6>15.6. Afebrile.  Pseudomonas in respiratory culture, suspect tracheobronchitis.  He completed a course of meropenem and is now off abx. Acinetobacter also noted, discussed with ID and not treating at this time. UA 2/23 is negative.  -  CXR improving pleural effusion.  9. Thrombocytopenia: Platelets 145k. 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  10. Hyponatremia: Sodium from 126>130. Received dose tolvaptan 2/26.     11. Pocket site pain: Improved with pm gabapentin dose.      HH has been set up. Need ongoing education for LVAD.   Home 24-48 hours.   Darrick Grinder, NP  12/31/2016, 7:54 AM  Advanced Heart Failure Team Pager [phone] (M-F; [place])  Please contact [facility] for night-coverage after hours (4p -7a ) and weekends on [email or web address]  Patient seen with NP, agree with the above note.  Doing well today.  Creatinine stable, co-ox 61%.  Sodium higher.  Appropriate bump in hgb s/p transfusion, no overt bleeding.  Volume status overall improved.  - Continue current torsemide.   Still pending INR, if therapeutic can stop heparin gtt and remove pacing wires.   Needs ongoing LVAD education.   Possibly home tomorrow if pacing wires are out.   Loralie Champagne 12/31/2016 8:35 AM

## 2016-12-31 NOTE — Progress Notes (Signed)
Nutrition Follow Up  DOCUMENTATION CODES:   Obesity unspecified  INTERVENTION:    Continue Glucerna Shake po BID, each supplement provides 220 kcal and 10 grams of protein  NUTRITION DIAGNOSIS:   Increased nutrient needs related to  (post-op healing) as evidenced by estimated needs, ongoing   GOAL:   Patient will meet greater than or equal to 90% of their needs, met  MONITOR:   PO intake, Supplement acceptance, Labs, Weight trends, I & O's  ASSESSMENT:   71 y.o. male with medical history significant of CHF, CKD, DM2, asthma. Patient presents to the ED with c/o worsening leg edema and SOB. Admitted with marked volume overload and low output HF. LVAD workup ongoing.  Pt s/p procedures 2/9: INSERTION OF IMPLANTABLE LEFT VENTRICULAR ASSIST DEVICE (N/A) - HEARTMATE II  PO intake good at 100% per flowsheet records. Heart Failure Rounding Note reviewed. LVAD education continues. CBG's [phone]. Home in 24-48 hrs.  Diet Order:  Diet heart healthy/carb modified Room service appropriate? Yes; Fluid consistency: Thin; Fluid restriction: 1500 mL Fluid  Skin:  Reviewed, no issues  Last BM:  2/26  Height:   Ht Readings from Last 1 Encounters:  12/13/16 5' 3"  (1.6 m)    Weight:   Wt Readings from Last 1 Encounters:  12/31/16 194 lb 3.2 oz (88.1 kg)   Ideal Body Weight:  61.8 kg  BMI:  Body mass index is 34.4 kg/m.  Estimated Nutritional Needs:   Kcal:  1900-2100  Protein:  105-115 gm  Fluid:  per MD  EDUCATION NEEDS:   No education needs identified at this time  Arthur Holms, RD, LDN Pager #: [phone] After-Hours Pager #: [phone]

## 2017-01-01 DIAGNOSIS — I5043 Acute on chronic combined systolic (congestive) and diastolic (congestive) heart failure: Secondary | ICD-10-CM

## 2017-01-01 LAB — COOXEMETRY PANEL
Carboxyhemoglobin: 2.5 % — ABNORMAL HIGH (ref 0.5–1.5)
Methemoglobin: 0.8 % (ref 0.0–1.5)
O2 Saturation: 59 %
Total hemoglobin: 8.4 g/dL — ABNORMAL LOW (ref 12.0–16.0)

## 2017-01-01 LAB — BASIC METABOLIC PANEL
Anion gap: 10 (ref 5–15)
BUN: 47 mg/dL — AB (ref 6–20)
CHLORIDE: 90 mmol/L — AB (ref 101–111)
CO2: 33 mmol/L — ABNORMAL HIGH (ref 22–32)
CREATININE: 1.8 mg/dL — AB (ref 0.61–1.24)
Calcium: 8.8 mg/dL — ABNORMAL LOW (ref 8.9–10.3)
GFR calc Af Amer: 42 mL/min — ABNORMAL LOW (ref >60)
GFR, EST NON AFRICAN AMERICAN: 36 mL/min — AB (ref >60)
GLUCOSE: 155 mg/dL — AB (ref 65–99)
Potassium: 4.1 mmol/L (ref 3.5–5.1)
SODIUM: 133 mmol/L — AB (ref 135–145)

## 2017-01-01 LAB — CBC
HCT: 25.2 % — ABNORMAL LOW (ref 39.0–52.0)
Hemoglobin: 8.3 g/dL — ABNORMAL LOW (ref 13.0–17.0)
MCH: 30.4 pg (ref 26.0–34.0)
MCHC: 32.9 g/dL (ref 30.0–36.0)
MCV: 92.3 fL (ref 78.0–100.0)
PLATELETS: 154 10*3/uL (ref 150–400)
RBC: 2.73 MIL/uL — ABNORMAL LOW (ref 4.22–5.81)
RDW: 18.3 % — ABNORMAL HIGH (ref 11.5–15.5)
WBC: 12.4 10*3/uL — ABNORMAL HIGH (ref 4.0–10.5)

## 2017-01-01 LAB — GLUCOSE, CAPILLARY
Glucose-Capillary: 147 mg/dL — ABNORMAL HIGH (ref 65–99)
Glucose-Capillary: 156 mg/dL — ABNORMAL HIGH (ref 65–99)
Glucose-Capillary: 203 mg/dL — ABNORMAL HIGH (ref 65–99)

## 2017-01-01 LAB — PROTIME-INR
INR: 2.26
Prothrombin Time: 25.4 seconds — ABNORMAL HIGH (ref 11.4–15.2)

## 2017-01-01 LAB — LACTATE DEHYDROGENASE: LDH: 331 U/L — ABNORMAL HIGH (ref 98–192)

## 2017-01-01 MED ORDER — SILDENAFIL CITRATE 20 MG PO TABS: 20.0000 mg | ORAL_TABLET | Freq: Three times a day (TID) | ORAL | 6 refills | Status: DC

## 2017-01-01 MED ORDER — LEVOTHYROXINE SODIUM 25 MCG PO TABS: 25.0000 ug | ORAL_TABLET | Freq: Every day | ORAL | 6 refills | Status: DC

## 2017-01-01 MED ORDER — DOCUSATE SODIUM 100 MG PO CAPS: 200.0000 mg | ORAL_CAPSULE | Freq: Every day | ORAL | 0 refills | Status: DC

## 2017-01-01 MED ORDER — TORSEMIDE 20 MG PO TABS: 60.0000 mg | ORAL_TABLET | Freq: Every day | ORAL | 6 refills | Status: DC

## 2017-01-01 MED ORDER — TRAMADOL HCL 50 MG PO TABS: 50.0000 mg | ORAL_TABLET | Freq: Four times a day (QID) | ORAL | 0 refills | Status: DC | PRN

## 2017-01-01 MED ORDER — WARFARIN SODIUM 5 MG PO TABS: 7.5000 mg | ORAL_TABLET | Freq: Every day | ORAL | 6 refills | Status: DC

## 2017-01-01 MED ORDER — BUSPIRONE HCL 5 MG PO TABS: 5.0000 mg | ORAL_TABLET | Freq: Two times a day (BID) | ORAL | 6 refills | Status: DC

## 2017-01-01 MED ORDER — PANTOPRAZOLE SODIUM 40 MG PO TBEC: 40.0000 mg | DELAYED_RELEASE_TABLET | Freq: Every day | ORAL | 6 refills | Status: DC

## 2017-01-01 MED ORDER — ATORVASTATIN CALCIUM 40 MG PO TABS: 40.0000 mg | ORAL_TABLET | Freq: Every day | ORAL | 6 refills | Status: DC

## 2017-01-01 MED ORDER — TRAZODONE HCL 50 MG PO TABS: 50.0000 mg | ORAL_TABLET | Freq: Every evening | ORAL | 0 refills | Status: DC | PRN

## 2017-01-01 MED ORDER — GABAPENTIN 600 MG PO TABS: 300.0000 mg | ORAL_TABLET | Freq: Two times a day (BID) | ORAL | 6 refills | Status: DC

## 2017-01-01 NOTE — Progress Notes (Signed)
Patient ID: Trevor [name], male   DOB: Dec 16, 1945, 71 y.o.   MRN: [id number]    Advanced Heart Failure Rounding Note   Subjective:    Admitted with marked volume overload and low output HF.  Echo EF 20-25%. Failed dual inotropes. IABP placed 2/7.   Underwent HM-II placement and TV repair on 2/9.   1 unit PRBCs 2/12.   Respiratory cultures with Pseudomonas, meropenem course completed.  Acinetobacter also isolated, discussed with ID and recommended not treating this since extubated.    1UPRBC 2/25 7.1 1UPRBC 2/26 7.8  Today CO-OX 59%. Sodium up to 133. Weight down 2 pounds.   Denies SOB/Orthopnea. Pain controlled. Wants to go home.   LVAD parameters:   Speed 9000  Flow 5.3  PI 6 Power 5.3  No PI events over night.    Objective:   Weight Range:  Vital Signs:   Temp:  [99.3 F (37.4 C)] 99.3 F (37.4 C) (02/27 1945) Pulse Rate:  [73] 73 (02/27 1945) Resp:  [18] 18 (02/27 1945) BP: (82-92)/(60-71) 82/60 (02/27 1945) SpO2:  [95 %-97 %] 95 % (02/27 2043) Last BM Date: 12/31/16  Weight change: Filed Weights   12/29/16 0532 12/30/16 0540 12/31/16 0430  Weight: 194 lb 3.2 oz (88.1 kg) 196 lb (88.9 kg) 194 lb 3.2 oz (88.1 kg)    Intake/Output:   Intake/Output Summary (Last 24 hours) at 01/01/17 0732 Last data filed at 12/31/16 1817  Gross per 24 hour  Intake                0 ml  Output             1350 ml  Net            -1350 ml    MAPs 80s  Physical Exam: General:  NAD. Walking in the room.    HEENT: normal   Neck: supple, JVP 7-8 Cor: Sternal wounds ok LVAD hum. irregular  Lungs:  Decreased in the bases. On room air.   Abdomen: obese soft, NT. Normal BS. Driveline site ok Extremities: no cyanosis, clubbing, rash.  RLE and LLE 1+ edema Neuro: Alert/oriented.    Telemetry: AF 70-80s. Personally reviewed   Labs: Basic Metabolic Panel:  Recent Labs Lab 12/28/16 0431 12/29/16 0448 12/30/16 0449 12/30/16 0921 12/30/16 1910 12/30/16 2314  12/31/16 0456 01/01/17 0304  NA 131* 128* 126* 125* 129* 127* 130* 133*  K 4.3 4.8 4.5  --   --   --  3.7 4.1  CL 85* 83* 83*  --   --   --  86* 90*  CO2 37* 34* 31  --   --   --  32 33*  GLUCOSE 122* 124* 152*  --   --   --  146* 155*  BUN 47* 48* 51*  --   --   --  49* 47*  CREATININE 1.52* 1.67* 1.78*  --   --   --  1.73* 1.80*  CALCIUM 8.8* 8.7* 8.7*  --   --   --  8.8* 8.8*    Liver Function Tests: No results for input(s): AST, ALT, ALKPHOS, BILITOT, PROT, ALBUMIN in the last 168 hours. No results for input(s): LIPASE, AMYLASE in the last 168 hours. No results for input(s): AMMONIA in the last 168 hours.  CBC:  Recent Labs Lab 12/29/16 0448 12/29/16 1928 12/30/16 0449 12/31/16 0456 01/01/17 0304  WBC 12.9* 23.7* 21.6* 15.9* 12.4*  HGB 9.7* 7.1* 7.8* 8.4* 8.3*  HCT 29.9* 21.6* 23.6* 25.4* 25.2*  MCV 92.9 91.9 91.8 91.0 92.3  PLT 123* 166 145* 149* 154    Cardiac Enzymes: No results for input(s): CKTOTAL, CKMB, CKMBINDEX, TROPONINI in the last 168 hours.  BNP: BNP (last 3 results)  Recent Labs  11/28/16 1104 12/14/16 0348  BNP 1,825.3* 427.7*    ProBNP (last 3 results)  Recent Labs  11/27/16 1207  PROBNP 1,746.0*      Other results:  Imaging: Dg Chest 2 View  Result Date: 12/30/2016 CLINICAL DATA:  Pneumonia. EXAM: CHEST  2 VIEW COMPARISON:  12/25/2016 FINDINGS: Post median sternotomy. Single lead right-sided pacemaker in place. Left ventricular assist device again seen. Tip of the left upper extremity PICC in the SVC. Low lung volumes persist. Stable cardiomegaly with prosthetic valves. Small pleural effusions persists, but decreased in the interval. Decreasing left midlung opacity. No new airspace disease. No pulmonary edema. No pneumothorax. Unchanged osseous structures. IMPRESSION: 1. Improving small bilateral pleural effusions. Improving left midlung opacity which may be in part fluid in the fissure. 2. Otherwise stable exam. Electronically Signed    By: Jeb Levering M.D.   On: 12/30/2016 18:27     Medications:     Scheduled Medications: . sodium chloride   Intravenous Once  . arformoterol  15 mcg Nebulization BID  . aspirin  81 mg Oral Daily  . bisacodyl  10 mg Oral Daily   Or  . bisacodyl  10 mg Rectal Daily  . budesonide (PULMICORT) nebulizer solution  0.5 mg Nebulization BID  . busPIRone  5 mg Oral BID  . docusate sodium  200 mg Oral Daily  . feeding supplement (GLUCERNA SHAKE)  237 mL Oral BID BM  . ferrous Q000111Q C-folic acid  1 capsule Oral BID  . fluticasone  1 spray Each Nare Daily  . gabapentin  300 mg Oral BID  . guaiFENesin  600 mg Oral BID  . insulin aspart  0-15 Units Subcutaneous TID WC  . insulin aspart  0-5 Units Subcutaneous QHS  . levothyroxine  25 mcg Oral QAC breakfast  . mouth rinse  15 mL Mouth Rinse BID  . pantoprazole  40 mg Oral Q1200  . sildenafil  20 mg Oral TID  . sodium chloride flush  10-40 mL Intracatheter Q12H  . sorbitol  30 mL Oral Once  . torsemide  60 mg Oral Daily  . umeclidinium bromide  1 puff Inhalation Daily  . warfarin   Does not apply Once  . Warfarin - Pharmacist Dosing Inpatient   Does not apply q1800    Infusions: . sodium chloride    . sodium chloride 20 mL/hr at 12/23/16 0700    PRN Medications: Place/Maintain arterial line **AND** sodium chloride, acetaminophen, levalbuterol, ondansetron (ZOFRAN) IV, sodium chloride flush, traMADol, traZODone   Assessment/Plan/Discussion    1. Acute on chronic systolic CHF: Ischemic cardiomyopathy but with prominent RV dysfunction on echo and exam.  Medtronic ICD.  Echo this admission showed EF 20-25% with moderately dilated LV and moderately dilated/moderately dysfunctional RV with severe TR.  He was admitted with low output biventricular failure, cardiorenal syndrome, and marked volume overload. Failed dual inotrope support. IABP placed 2/7. HM-II and TV repair performed 2/9 after discussion with [facility] transplant  team. POD #3 HM-II and TV repair.  Extubated 2/11, off NO, vasopressin, and epinephrine. Speed decreased to 8800 rpm after ramp echo on 2/16 for RV support but increased back to 9000 to help get him off norepinephrine.  Off norepi  and milrinone.   Volume status stable. Creatinine stable at 1.8.  Co-ox 59%.    - Continue torsemide 60 mg daily.   - Continue LVAD speed at 9000. - On Sildenafil 20 mg tid for RV failure.   - Continue warfarin, ASA 81 daily.  INR 2.26. Pacing wires will be removed today.  Home this afternoon. He will keep CT sutures until visit in the clinic next week.  2. CAD: s/p CABG.  No signs/sx of ischemia.  3. AKI on CKD stage III: Suspect cardiorenal syndrome in setting of low output failure/RV failure.  Creatinine plateaued 1.78 > 1.73>1.8  4. Atrial fibrillation: Chronic.  Rate is controlled 5. Post-op anemia: Also component of anemia of renal disease/chronic disease.  Hgb with appropriate bump after 1 unit PRBCs yesterday.  No overt bleeding.    Today's Hgb 8.3, stable.  6. Acute liver failure, post-op: Bilirubin improved. Likely hypoperfusion. Holding hepato-toxic agents.  7. Aortic stenosis s/p TAVR: Stable on echo.  8. ID: Afebrile.  WBCs 21.6>15.6>12.6. Afebrile.  Pseudomonas in respiratory culture, suspect tracheobronchitis.  He completed a course of meropenem and is now off abx. Acinetobacter also noted, discussed with ID and not treating at this time. UA 2/23 is negative.  -  CXR stable.   9. Thrombocytopenia: Platelets 154k. 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  10. Hyponatremia: Sodium trending up 126>130>133. Received dose tolvaptan 2/26.     11. Pocket site pain: Improved with pm gabapentin dose.   HH has been set up. Need ongoing education for LVAD.   Home after lunch.   Darrick Grinder, NP  01/01/2017, 7:32 AM  Advanced Heart Failure Team Pager [phone] (M-F; 7a - 4p)  Please contact [facility] for night-coverage after hours (4p -7a ) and  weekends  Patient seen with NP, agree with the above note.  He is doing well this morning.  Hemoglobin and creatinine stable.  Weight trending down.  Still has some volume overload but will treat this slowly over time.    Pacing wires to come out this morning then home this afternoon.   Will send home with gabapentin and tramadol for pain, trazodone for sleep.  No narcotic pain meds.   Loralie Champagne 01/01/2017 8:34 AM

## 2017-01-01 NOTE — Progress Notes (Signed)
19 Days Post-Op Procedure(s) (LRB): INSERTION OF IMPLANTABLE LEFT VENTRICULAR ASSIST DEVICE (N/A) TRICUSPID VALVE REPAIR WITH EDWARDS MC 3 TRICUSPID ANNULOPLASTY RING MODEL 4900 SIZE T 28 (N/A) TRANSESOPHAGEAL ECHOCARDIOGRAM (TEE) (N/A) Subjective: Looks good INR therapeutic off heparin Will remove pacing wires Ok for DC from surgical perspective  Objective: Vital signs in last 24 hours: Temp:  [99.3 F (37.4 C)] 99.3 F (37.4 C) (02/27 1945) Pulse Rate:  [73] 73 (02/27 1945) Cardiac Rhythm: Atrial fibrillation;Bundle branch block (02/28 0701) Resp:  [18] 18 (02/27 1945) BP: (82-92)/(60-71) 82/60 (02/27 1945) SpO2:  [95 %-97 %] 95 % (02/27 2043)  Hemodynamic parameters for last 24 hours:    Intake/Output from previous day: 02/27 0701 - 02/28 0700 In: -  Out: 1950 [Urine:1950] Intake/Output this shift: No intake/output data recorded.  Incisions well healed Min pedal edema  Lab Results:  Recent Labs  12/31/16 0456 01/01/17 0304  WBC 15.9* 12.4*  HGB 8.4* 8.3*  HCT 25.4* 25.2*  PLT 149* 154   BMET:  Recent Labs  12/31/16 0456 01/01/17 0304  NA 130* 133*  K 3.7 4.1  CL 86* 90*  CO2 32 33*  GLUCOSE 146* 155*  BUN 49* 47*  CREATININE 1.73* 1.80*  CALCIUM 8.8* 8.8*    PT/INR:  Recent Labs  01/01/17 0304  LABPROT 25.4*  INR 2.26   ABG    Component Value Date/Time   PHART 7.357 12/17/2016 0415   HCO3 27.2 12/17/2016 0415   TCO2 29 12/19/2016 1458   O2SAT 59.0 01/01/2017 0435   CBG (last 3)   Recent Labs  12/31/16 1813 12/31/16 2135 01/01/17 0721  GLUCAP 164* 188* 147*    Assessment/Plan: S/P Procedure(s) (LRB): INSERTION OF IMPLANTABLE LEFT VENTRICULAR ASSIST DEVICE (N/A) TRICUSPID VALVE REPAIR WITH EDWARDS MC 3 TRICUSPID ANNULOPLASTY RING MODEL 4900 SIZE T 28 (N/A) TRANSESOPHAGEAL ECHOCARDIOGRAM (TEE) (N/A) DC today Remove chest tube sutures in clinic   LOS: 34 days    Trevor [name] 01/01/2017

## 2017-01-01 NOTE — Progress Notes (Signed)
Pt. Discharged to home with sisters Pt. D/C'd via wheelchair with RN Discharge information reviewed and given with Rx All personal belongings given to Pt. including all needed personal LVAD equipment  Education discussed and reviewed with teach back PICC was d/c by IV team  Tele d/c

## 2017-01-01 NOTE — Progress Notes (Signed)
Removed wire at 1005 with no resistance and Pt. Tolerated procedure well. On bedrest with q15 VS

## 2017-01-01 NOTE — Progress Notes (Signed)
CSW met at bedside with patient who reports plans to be discharged today. Patient states he is ready and sisters will be by this afternoon to transport him home. Patient states he is looking forward to getting starting with his life with an LVAD. CSW encouraged patient to come to Support Group meetings to meet up with other patients and caregivers. Patient open to the idea. CSW shared sisters attended while patient was in house and unable to attend. Patient's sisters were able to meet other caregivers and have added support. CSW will continue to follow for support through the outpatient VAD clinic. Raquel Sarna, [place], [place]

## 2017-01-01 NOTE — Care Management Note (Signed)
Case Management Note Previous CM note initiated by Trevor Labrador, RN 12/24/2016, 8:46 AM   Patient Details  Name: Trevor [name] MRN: [id number] Date of Birth: 10/16/1946  Subjective/Objective:   S/p LVAD placement 2/9                 Action/Plan:  PTA independent from home with sister.  LVAD team following closely.  Pt has recommendations for HH - no orders as of yet.  CM will continue to follow for discharge needs   Expected Discharge Date:  01/01/17             Expected Discharge Plan:  [place]  In-House Referral:  Clinical Social Work  Discharge planning Services  CM Consult  Post Acute Care Choice:  Home Health, Durable Medical Equipment Choice offered to:  Patient, Sibling  DME Arranged:  [place] rolling with seat DME Agency:  [place]:  RN, Disease Management [place] Agency:  [place]  Status of Service:  Completed, signed off  If discussed at [place] of Stay Meetings, dates discussed:  2.20, 2/22, 2/27  Discharge Disposition: home/home health   Additional Comments:  01/01/17- 1040- Trevor Wafer RN, CM- pt for d/c home today- [place] orders for HF management have been written- referral to [facility] who will f/u with pt. Per Cardiac rehab pt will also need rollator for home- have notified Trevor [name] for DME need- rollator to be delivered to room prior to discharge. Trevor [name] with [facility] aware of pt's discharge for today.     12/26/16- 1040- Trevor Kreager RN, CM- pt is off IV milrinone - plan is to d/c home on Monday- 2/26- pt will d/c home with sister- d/c teaching being done with sister for LVAD needs. Pt remains on IV heparin until INR>2, IV lasix still being given. HHRN-HF orders have been placed- [facility] has been chosen by pt previously - and referral placed- spoke with Trevor [name] at [facility] regarding [place] orders for discharge,- [facility] is following for home needs and [place] orders.   12/24/16- Trevor Gibbons RN, CM- pt is not LTACH  appropriate at this time- has tx out of ICU to 2W and is off of pressors- remains on IV milrinone-plans to wean for d/c.   Trevor Labrador, RN 12/24/2016, 8:46 AM-Discussed in LOS 12/24/16 - pt remains appropriate for continued stay.  Pt had to be restarted on a pressor while asleep due to drop in BP per HF team note.  Per Physician Advisor- if pt continues to need norepi on an intermittent basis pt may be appropriate for [facility] referral.  Pt has transferred to [place] will continue to monitor for discharge planning and appropriate disposition.    12/19/16 CM spoke in depth with pt - pt alert and oriented - remains on Milrinone and IV lasix.  Discussed in LOS 12/19/16 - pt remains appropriate for continued stay.  Pt confirmed he will discharge home with his sister - sister will provided around the clock supervision at discharge.   CM spoke with pt about [place] recommended - pt had no preference with HH/DME agency- pt is in agreement with [facility] (LVAD agency of choice).  CM will arrange [facility] and DME when orders are written - agency is aware of tentative referral.   CM will continue to follow for discharge needs   Trevor Patricia, RN 01/01/2017, 10:41 AM [phone]

## 2017-01-01 NOTE — Discharge Summary (Signed)
Advanced Heart Failure Team  Discharge Summary   Patient ID: Trevor [name] MRN: [id number], DOB/AGE: Jan 13, 1946 71 y.o. Admit date: 11/27/2016 D/C date:     01/01/2017   Primary Discharge Diagnoses:  1. A/C Systolic Heart Failure 2. 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 AKI on CKD III 4. Acute Live Failure 5. Post Op Anemia 6. Thrombocytopenia 7. Hyponatremia 8.  Elevated WBC 10. Pocket Pain 11. AS: H/O TAVR   12. CAD: H/O CABG   Hospital Course:  Meghan Faatz Murphyis a 71 y.o.malewith a history of CAD, CABG x4 2010, OSA, AS with TAVR 2015, CKD, DM2, PAF, asthma, and Medtronic ICD.   Recently relocated to [facility] from [facility] about a week ago. Over the last couple of years he was been admitted for A/C systolic heart 4-5 times a year. Most recent admit was in January. Sounds like the hospitalization was complicated by cardiorenal syndrome. While admitted there was discussion about dialysis but he was able to avoid it.   Admitted with marked volume overload and low output biventricular heart failure. He was placed on dual inotropes to facilitate diuresis, cardiac output and renal function.  CT surgery consulted for mechanical support and he was deemed appropriate for LVAD work up. He completed LVAD work up and was approved for HMII LVAD --> DT. On 12/11/16 he IABP placed to optimize cardiac output and improve renal funciton. He then underwent HMII LVAD placement on 12/14/2015. Post operatively pressors weaned off slowly. He was diuresed as needed for volume overload. He received extensive LVAD education. [facility] will follow for HF/LVAD. Plan to follow closely in the LVAD clinic.   1. Acute on chronic systolic CHF: Ischemic cardiomyopathy but with prominent RV dysfunction on echo and exam. Medtronic ICD.  Echo on 2/6 this admission showed EF 20-25% with moderately dilated LV and moderately dilated/moderately dysfunctional RV with severe TR. He was admitted with low output biventricular  failure, cardiorenal syndrome, and marked volume overload. Failed dual inotrope support. IABP placed 2/7. HM-II and TV repair performed 2/9 after discussion with [facility] transplant team. HM-II and TV repair.  Extubated 2/11, off NO, vasopressin, and epinephrine. Speed decreased to 8800 rpm after ramp echo on 2/16 for RV support but increased back to 9000 to help get him off norepinephrine.  Off norepi and milrinone.   Volume status stable. Creatinine stable at 1.8.  Co-ox 59%.    - Continue torsemide 60 mg daily.   - Continue LVAD speed at 9000. - On Sildenafil 20 mg tid for RV failure.   - Continue warfarin, ASA 81 daily.  INR 2.26. Pacing wires will be removed today.  Home this afternoon. He will keep CT sutures until visit in the clinic next week.  2. CAD: s/p CABG. No signs/sx of ischemia.  3. AKI on CKD stage III: Suspect cardiorenal syndrome in setting of low output failure/RV failure.  Renal function was followed closely though out his hospitalization. Creatinine peaked at  2.38. Creatinine was down to 1.8 on the day of discharge.  4. Atrial fibrillation: Chronic. Rate is controlled. He is on chronic coumadin.  5. Post-op anemia: Also component of anemia of renal disease/chronic disease.   Received 1UPRBCs on 2/25 and  12/30/2016  No overt bleeding. Hgb on the day of discharge is Hgb 8.3, stable.  6. Acute liver failure, post-op: Bilirubin improved. Likely hypoperfusion. Holding hepato-toxic agents.  7. Aortic stenosis s/p TAVR: Stable on echo.  8. ID: Afebrile.  WBCs 21.6>15.6>12.6. Afebrile.  Pseudomonas  in respiratory culture, suspect tracheobronchitis.  He completed a course of meropenem and is now off abx. Acinetobacter also noted, discussed with ID and not treating at this time. UA 2/23 is negative.  -  CXR stable.   9. Thrombocytopenia: Platelets 154k. 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  10. Hyponatremia: Sodium down to 126 but improved to 133 after dose of tolvaptain.      11.  Pocket site pain: Improved with pm gabapentin dose. Continue ultram as needed.   LVAD parameters:   Speed 9000  Flow 5.3  PI 6 Power 5.3  No PI events over night  RHC 12/11/2016 IABP placed.  RA mean 14 RV 62/15 PA 69/25, mean 38 PCWP mean 22 CVP/PCWP 0.63 Oxygen saturations: PA 64% AO 96% Cardiac Output (Fick) 6.78  Cardiac Index (Fick) 3.51 PVR 3.83 WU Cardiac Output (Thermo) 4.85 Cardiac Index (Thermo) 2.51  PVR 5.36 WU  RHC 12/02/2016 RA mean 9 RV 52/11 PA 52/16, mean 31 PCWP mean 16 CVP/PCWP = 0.56 Oxygen saturations: PA 63% AO 95% Cardiac Output (Fick) 6.93  Cardiac Index (Fick) 3.55 PVR 2.1 WU Cardiac Output (Thermo) 6.16 Cardiac Index (Thermo) 3.16  PVR 2.4 WU Discharge Weight:  194 pounds  Discharge Vitals: Blood pressure 93/71, pulse 73, temperature 99.3 F (37.4 C), temperature source Oral, resp. rate 18, height 5\' 3"  (1.6 m), weight 194 lb 3.2 oz (88.1 kg), SpO2 95 %.  Labs: Lab Results  Component Value Date   WBC 12.4 (H) 01/01/2017   HGB 8.3 (L) 01/01/2017   HCT 25.2 (L) 01/01/2017   MCV 92.3 01/01/2017   PLT 154 01/01/2017    Recent Labs Lab 01/01/17 0304  NA 133*  K 4.1  CL 90*  CO2 33*  BUN 47*  CREATININE 1.80*  CALCIUM 8.8*  GLUCOSE 155*   Lab Results  Component Value Date   CHOL 59 12/04/2016   HDL 30 (L) 12/04/2016   LDLCALC 15 12/04/2016   TRIG 68 12/04/2016   BNP (last 3 results)  Recent Labs  11/28/16 1104 12/14/16 0348  BNP 1,825.3* 427.7*    ProBNP (last 3 results)  Recent Labs  11/27/16 1207  PROBNP 1,746.0*     Diagnostic Studies/Procedures   Dg Chest 2 View  Result Date: 12/30/2016 CLINICAL DATA:  Pneumonia. EXAM: CHEST  2 VIEW COMPARISON:  12/25/2016 FINDINGS: Post median sternotomy. Single lead right-sided pacemaker in place. Left ventricular assist device again seen. Tip of the left upper extremity PICC in the SVC. Low lung volumes persist. Stable cardiomegaly with prosthetic valves. Small pleural  effusions persists, but decreased in the interval. Decreasing left midlung opacity. No new airspace disease. No pulmonary edema. No pneumothorax. Unchanged osseous structures. IMPRESSION: 1. Improving small bilateral pleural effusions. Improving left midlung opacity which may be in part fluid in the fissure. 2. Otherwise stable exam. Electronically Signed   By: Jeb Levering M.D.   On: 12/30/2016 18:27    Discharge Medications   Allergies as of 01/01/2017      Reactions   No Known Allergies       Medication List    STOP taking these medications   apixaban 2.5 MG Tabs tablet Commonly known as:  ELIQUIS   carvedilol 6.25 MG tablet Commonly known as:  COREG   ENTRESTO 24-26 MG Generic drug:  sacubitril-valsartan   hydrALAZINE 25 MG tablet Commonly known as:  APRESOLINE   isosorbide dinitrate 30 MG tablet Commonly known as:  ISORDIL   isosorbide mononitrate 30  MG 24 hr tablet Commonly known as:  IMDUR   potassium chloride SA 20 MEQ tablet Commonly known as:  K-DUR,KLOR-CON     TAKE these medications   albuterol 108 (90 Base) MCG/ACT inhaler Commonly known as:  PROVENTIL HFA;VENTOLIN HFA Inhale 2 puffs into the lungs every 4 (four) hours as needed for wheezing or shortness of breath.   albuterol (2.5 MG/3ML) 0.083% nebulizer solution Commonly known as:  PROVENTIL Take 3 mLs (2.5 mg total) by nebulization every 4 (four) hours. And as needed   ascorbic acid 1000 MG tablet Commonly known as:  VITAMIN C Take 1,000 mg by mouth daily.   aspirin EC 81 MG tablet Take 81 mg by mouth daily.   atorvastatin 40 MG tablet Commonly known as:  LIPITOR Take 1 tablet (40 mg total) by mouth daily.   benzonatate 100 MG capsule Commonly known as:  TESSALON Take by mouth 3 (three) times daily as needed for cough.   busPIRone 5 MG tablet Commonly known as:  BUSPAR Take 1 tablet (5 mg total) by mouth 2 (two) times daily.   docusate sodium 100 MG capsule Commonly known as:   COLACE Take 2 capsules (200 mg total) by mouth daily.   fluticasone 110 MCG/ACT inhaler Commonly known as:  FLOVENT HFA Inhale 2 puffs into the lungs 2 (two) times daily.   fluticasone 50 MCG/ACT nasal spray Commonly known as:  FLONASE Place into both nostrils daily.   gabapentin 600 MG tablet Commonly known as:  NEURONTIN Take 0.5 tablets (300 mg total) by mouth 2 (two) times daily.   insulin glargine 100 UNIT/ML injection Commonly known as:  LANTUS Inject 20 Units into the skin at bedtime.   levothyroxine 25 MCG tablet Commonly known as:  SYNTHROID, LEVOTHROID Take 1 tablet (25 mcg total) by mouth daily before breakfast.   multivitamin tablet Take 1 tablet by mouth daily.   pantoprazole 40 MG tablet Commonly known as:  PROTONIX Take 1 tablet (40 mg total) by mouth daily at 12 noon.   sildenafil 20 MG tablet Commonly known as:  REVATIO Take 1 tablet (20 mg total) by mouth 3 (three) times daily.   STIOLTO RESPIMAT 2.5-2.5 MCG/ACT Aers Generic drug:  Tiotropium Bromide-Olodaterol Inhale 2 puffs into the lungs daily.   torsemide 20 MG tablet Commonly known as:  DEMADEX Take 3 tablets (60 mg total) by mouth daily. What changed:  how much to take   traMADol 50 MG tablet Commonly known as:  ULTRAM Take 1 tablet (50 mg total) by mouth every 6 (six) hours as needed for moderate pain.   traZODone 50 MG tablet Commonly known as:  DESYREL Take 1 tablet (50 mg total) by mouth at bedtime as needed for sleep.   warfarin 5 MG tablet Commonly known as:  COUMADIN Take 1.5 tablets (7.5 mg total) by mouth daily.            Durable Medical Equipment        Start     Ordered   12/26/16 [phone]  Heart failure home health orders  (Heart failure home health orders / Face to face)  Once    Comments:  Heart Failure Follow-up Care:  Verify follow-up appointments per Patient Discharge Instructions. Confirm transportation arranged. Reconcile home medications with discharge  medication list. Remove discontinued medications from use. Assist patient/caregiver to manage medications using pill box. Reinforce low sodium food selection Assessments: Vital signs and oxygen saturation at each visit. Assess home environment for safety concerns, caregiver  support and availability of low-sodium foods. Consult Education officer, museum, PT/OT, Dietitian, and CNA based on assessments. Perform comprehensive cardiopulmonary assessment. Notify MD for any change in condition or weight gain of 3 pounds in one day or 5 pounds in one week with symptoms. Daily Weights and Symptom Monitoring: Ensure patient has access to scales. Teach patient/caregiver to weigh daily before breakfast and after voiding using same scale and record.    Teach patient/caregiver to track weight and symptoms and when to notify Provider. Activity: Develop individualized activity plan with patient/caregiver.   Question Answer Comment  Heart Failure Follow-up Care Advanced Heart Failure (AHF) Clinic at [phone]   Obtain the following labs Basic Metabolic Panel   Lab frequency Other see comments   Fax lab results to AHF Clinic at [phone]   Diet Low Sodium Heart Healthy   Fluid restrictions: 2000 mL Fluid   Initiate Heart Failure Clinic Diuretic Protocol to be used by [place] only ( to be ordered by Heart Failure Team Providers Only) Yes      12/26/16 0832      Disposition   The patient will be discharged in stable condition to home. Discharge Instructions    (HEART FAILURE PATIENTS) Call MD:  Anytime you have any of the following symptoms: 1) 3 pound weight gain in 24 hours or 5 pounds in 1 week 2) shortness of breath, with or without a dry hacking cough 3) swelling in the hands, feet or stomach 4) if you have to sleep on extra pillows at night in order to breathe.    Complete by:  As directed    Amb Referral to Cardiac Rehabilitation    Complete by:  As directed    Diagnosis:  Other    Diet - low sodium heart healthy    Complete by:  As directed    Driving Restrictions    Complete by:  As directed    No driving   Heart Failure patients record your daily weight using the same scale at the same time of day    Complete by:  As directed    Increase activity slowly    Complete by:  As directed    Lifting restrictions    Complete by:  As directed    NO liftiing less than 5 pounds   Page VAD Coordinator at [phone]  Notify for: any VAD alarms, sustained elevations of power >10 watts, sustained drop in Pulse Index <3    Complete by:  As directed    Notify for:   any VAD alarms sustained elevations of power >10 watts sustained drop in Pulse Index <3     Speed Settings:    Complete by:  As directed    Fixed 9000 RPM Low 8400 RPM     Follow-up [place] Follow up.   Why:  HHRN for HF management arranged- they will call you to set up home visits  Contact information: [place] [address] [phone]        Loralie Champagne, MD Follow up on 01/09/2017.   Specialty:  Cardiology Why:  at 1100. Garage Code [id number] Contact information: [place] [address] [phone]             Duration of Discharge Encounter: Greater than 35 minutes   Signed, Amy Clegg  NP-C  01/01/2017, 10:15 AM

## 2017-01-01 NOTE — Progress Notes (Addendum)
All equipment brought to the patient room and ready for discharge. Driveline dressing changed per protocol. Distal and proximal sutures remain. Per Dr. Prescott Gum will remove CT sutures and assess readiness for distal suture.     [name], RN VAD Coordinator   Office: [phone] 24/7 Emergency VAD Pager: [phone]

## 2017-01-01 NOTE — Progress Notes (Signed)
Ed completed with pt with good understanding. Included low sodium, vitamin K restrictions, ex gl, sternal precautions, IS and CRPII. Will refer to [place]. Pt is doing well, receptive. [phone] Yves Dill CES, ACSM 9:35 AM 01/01/2017

## 2017-01-02 ENCOUNTER — Telehealth (HOSPITAL_COMMUNITY): Payer: Self-pay | Admitting: General Practice

## 2017-01-02 NOTE — Telephone Encounter (Signed)
Verified Loss adjuster, chartered through [employer] A/B primary and  [place] follows Medicare guidelines...Marland KitchenMarland KitchenMarland Kitchen Reference # [phone].... KJ

## 2017-01-04 DIAGNOSIS — Z48812 Encounter for surgical aftercare following surgery on the circulatory system: Secondary | ICD-10-CM | POA: Diagnosis not present

## 2017-01-07 ENCOUNTER — Encounter (HOSPITAL_COMMUNITY): Payer: Self-pay | Admitting: *Deleted

## 2017-01-08 ENCOUNTER — Ambulatory Visit: Payer: Medicare Other | Admitting: Internal Medicine

## 2017-01-09 ENCOUNTER — Ambulatory Visit (HOSPITAL_COMMUNITY)
Admission: RE | Admit: 2017-01-09 | Discharge: 2017-01-09 | Disposition: A | Discharge status: Home / Self Care | Payer: Medicare Other | Source: Ambulatory Visit | Attending: Cardiology | Admitting: Cardiology

## 2017-01-09 ENCOUNTER — Ambulatory Visit (HOSPITAL_COMMUNITY): Payer: Self-pay | Admitting: Pharmacist

## 2017-01-09 DIAGNOSIS — Z794 Long term (current) use of insulin: Secondary | ICD-10-CM | POA: Diagnosis not present

## 2017-01-09 DIAGNOSIS — N19 Unspecified kidney failure: Secondary | ICD-10-CM | POA: Diagnosis not present

## 2017-01-09 DIAGNOSIS — N183 Chronic kidney disease, stage 3 (moderate): Secondary | ICD-10-CM | POA: Diagnosis not present

## 2017-01-09 DIAGNOSIS — I5022 Chronic systolic (congestive) heart failure: Secondary | ICD-10-CM | POA: Diagnosis not present

## 2017-01-09 DIAGNOSIS — Z95811 Presence of heart assist device: Secondary | ICD-10-CM

## 2017-01-09 DIAGNOSIS — I255 Ischemic cardiomyopathy: Secondary | ICD-10-CM | POA: Diagnosis not present

## 2017-01-09 DIAGNOSIS — I482 Chronic atrial fibrillation: Secondary | ICD-10-CM | POA: Insufficient documentation

## 2017-01-09 DIAGNOSIS — Z7901 Long term (current) use of anticoagulants: Secondary | ICD-10-CM | POA: Diagnosis not present

## 2017-01-09 DIAGNOSIS — Z9889 Other specified postprocedural states: Secondary | ICD-10-CM | POA: Insufficient documentation

## 2017-01-09 DIAGNOSIS — Z95810 Presence of automatic (implantable) cardiac defibrillator: Secondary | ICD-10-CM | POA: Diagnosis not present

## 2017-01-09 DIAGNOSIS — E1122 Type 2 diabetes mellitus with diabetic chronic kidney disease: Secondary | ICD-10-CM | POA: Diagnosis not present

## 2017-01-09 DIAGNOSIS — I2510 Atherosclerotic heart disease of native coronary artery without angina pectoris: Secondary | ICD-10-CM | POA: Diagnosis not present

## 2017-01-09 DIAGNOSIS — G4733 Obstructive sleep apnea (adult) (pediatric): Secondary | ICD-10-CM | POA: Diagnosis not present

## 2017-01-09 DIAGNOSIS — I5023 Acute on chronic systolic (congestive) heart failure: Secondary | ICD-10-CM | POA: Diagnosis not present

## 2017-01-09 DIAGNOSIS — Z951 Presence of aortocoronary bypass graft: Secondary | ICD-10-CM | POA: Insufficient documentation

## 2017-01-09 DIAGNOSIS — I1310 Hypertensive heart and chronic kidney disease without heart failure, with stage 1 through stage 4 chronic kidney disease, or unspecified chronic kidney disease: Secondary | ICD-10-CM | POA: Diagnosis not present

## 2017-01-09 DIAGNOSIS — Z79899 Other long term (current) drug therapy: Secondary | ICD-10-CM | POA: Insufficient documentation

## 2017-01-09 DIAGNOSIS — I480 Paroxysmal atrial fibrillation: Secondary | ICD-10-CM | POA: Diagnosis not present

## 2017-01-09 DIAGNOSIS — N189 Chronic kidney disease, unspecified: Secondary | ICD-10-CM

## 2017-01-09 DIAGNOSIS — I4510 Unspecified right bundle-branch block: Secondary | ICD-10-CM | POA: Insufficient documentation

## 2017-01-09 DIAGNOSIS — J45909 Unspecified asthma, uncomplicated: Secondary | ICD-10-CM | POA: Diagnosis not present

## 2017-01-09 DIAGNOSIS — Z09 Encounter for follow-up examination after completed treatment for conditions other than malignant neoplasm: Secondary | ICD-10-CM | POA: Diagnosis not present

## 2017-01-09 DIAGNOSIS — Z7982 Long term (current) use of aspirin: Secondary | ICD-10-CM | POA: Insufficient documentation

## 2017-01-09 LAB — BASIC METABOLIC PANEL
ANION GAP: 10 (ref 5–15)
BUN: 41 mg/dL — ABNORMAL HIGH (ref 6–20)
CO2: 29 mmol/L (ref 22–32)
Calcium: 8.4 mg/dL — ABNORMAL LOW (ref 8.9–10.3)
Chloride: 97 mmol/L — ABNORMAL LOW (ref 101–111)
Creatinine, Ser: 1.98 mg/dL — ABNORMAL HIGH (ref 0.61–1.24)
GFR calc Af Amer: 37 mL/min — ABNORMAL LOW (ref >60)
GFR, EST NON AFRICAN AMERICAN: 32 mL/min — AB (ref >60)
GLUCOSE: 183 mg/dL — AB (ref 65–99)
POTASSIUM: 3.6 mmol/L (ref 3.5–5.1)
Sodium: 136 mmol/L (ref 135–145)

## 2017-01-09 LAB — CBC
HEMATOCRIT: 27.3 % — AB (ref 39.0–52.0)
HEMOGLOBIN: 8.9 g/dL — AB (ref 13.0–17.0)
MCH: 30.6 pg (ref 26.0–34.0)
MCHC: 32.6 g/dL (ref 30.0–36.0)
MCV: 93.8 fL (ref 78.0–100.0)
Platelets: 177 10*3/uL (ref 150–400)
RBC: 2.91 MIL/uL — AB (ref 4.22–5.81)
RDW: 18.5 % — ABNORMAL HIGH (ref 11.5–15.5)
WBC: 9.9 10*3/uL (ref 4.0–10.5)

## 2017-01-09 LAB — PROTIME-INR
INR: 2.11
Prothrombin Time: 24 seconds — ABNORMAL HIGH (ref 11.4–15.2)

## 2017-01-09 LAB — PREALBUMIN: PREALBUMIN: 17.5 mg/dL — AB (ref 18–38)

## 2017-01-09 LAB — LACTATE DEHYDROGENASE: LDH: 401 U/L — ABNORMAL HIGH (ref 98–192)

## 2017-01-09 MED ORDER — INSULIN GLARGINE 100 UNIT/ML ~~LOC~~ SOLN: 20.0000 [IU] | Freq: Every day | SUBCUTANEOUS | 1 refills | Status: DC

## 2017-01-09 NOTE — Progress Notes (Addendum)
Patient presents for 1 week  follow up in [facility] today from his hospital VAD implant stay. Reports no problems with VAD equipment or concerns with drive line.  Vital Signs:  Doppler Pressure: 82  Automatc BP: 99/54 (78) HR: 87   SPO2:98  %  Height: Weight: 191.4 lb w/ eqt D/C weight: 194 lb Home weights: 185-188 lbs w/o eqt  VAD Indication: Destination Therapy    VAD interrogation & Equipment Management (Reviewed with [name]): Speed:9000 Flow: 6.0 Power:5.9 w    PI:5.3  Alarms: no clinical alarms Events: rare   Fixed speed 9000 Low speed limit: 8800  Primary Controller:  Replace back up battery in 21 months. Back up controller:   Replace back up battery in 21 months.  I reviewed the LVAD parameters from today and compared the results to the patient's prior recorded data. LVAD interrogation was NEGATIVE for significant power changes, NEGATIVE for clinical alarms and STABLE for PI events/speed drops. No programming changes were made and pump is functioning within specified parameters. Pt is performing daily controller and system monitor self tests along with completing weekly and monthly maintenance for LVAD equipment.  LVAD equipment check completed and is in good working order. Back-up equipment present. Charged back up battery and performed self-test on equipment.   Exit Site Care: Drive line is being maintained weekly by sisters. Drive line exit site well healed and incorporated. Pt had some scabbed spots along the tape area of the driveline. The spots were all within the outside of the dressing around where the skin prep is applied. Pt was instructed to avoid using the skin prep before applying the dressing. Wife verbalized understanding of these instructions.  The velour is fully implanted at exit site. Proximal and Distal stitch still present. Distal stitch removed in clinic today along with all CT sutures.  Dressing dry and intact.  VAD dressing removed  and site care performed using sterile technique. Drive line exit site cleaned with Chlora prep applicators x 2, allowed to dry, skin protectant applied and allowed to dry before S[place]aview dressing and aquaseal re-applied. Exit site well healed and incorporated.The velour is fully implanted at exit site. Slight redness,no tenderness, small amount of brownish yellow crust around driveline, no foul odor noted. Pt denies fever or chills. Driveline dressing is being changed daily per sterile technique. Pt denies fever or chills.   Back-up equipment present. LVAD education done on emergency procedures and precautions and reviewed exit site care. Patient states he has enough dressing supplies but would like some more adhesive remover. Pt informed we will order some adhesive remover and have for him next week.      Significant Events on VAD Support:  12/13/16 VAD implant D/C on 2/28  Device: medtronic Therapies: on VT monitor on@150 , VF @ 230 Last check:    BP & Labs:  MAP-78 - Doppler   Hgb 8.9- No S/S of bleeding. Specifically denies melena/BRBPR or nosebleeds.  LDH stable at 401. Hospital DC was 331, we will recheck this next week in hopes it has decreased per [name]. Denies tea-colored urine. No power elevations noted on interrogation.   Patient Instructions:  1. Avoid skin prep before applying dressing due to itchy spots.  2. Refill sent to pharmacy for Lantus. 3. Continue current dose of Coumadin 7.5mg  daily.  3. Return to clinic in 1 week.   [name], BSN VAD Coordinator   Office: [phone] 24/7 Emergency VAD Pager: [phone]    Cardiology: Dr.  Hilman Kissling Murphyis a 71 y.o.malewith a history of CAD s/p CABG x 4 2010, OSA, AS with TAVR 2015, CKD, DM2, paroxysmal atrial fibrillation, asthma, and ischemic cardiomyopathy with Medtronic ICD.   Relocated to [place] from [place] in early 2018. Over the last couple of years, he had been admitted  multiple times for CHF. Last admission in [place] was in 1/18. Sounds like the hospitalization was complicated by cardiorenal syndrome and there was consideration of doing dialysis.  He was admitted in to [facility] 11/27/16 with NYHA class IV symptoms and hypotension. He was placed on dual inotropes to facilitate diuresis, cardiac output and renal function.  CT surgery consulted for mechanical support and he was deemed appropriate for LVAD work up. He completed LVAD work up and was approved for HMII LVAD --> DT. On 12/11/16, he had IABP placed to optimize cardiac output and improve renal function. He then underwent HMII LVAD placement + tricuspid valve repair on 12/14/2015. Post operatively pressors weaned off slowly. He required significant diuresis.  Course was complicated by RV dysfunction and renal dysfunction.  Symptomatically, he says that he is doing very well.  Feels much better compared to prior to LVAD placement.  Still with significant peripheral edema.  Weight has been stable.  He is going on two walks a day.  Denies exertional dyspnea when walking on flat ground.  Able to do housework.  Minimal surgical site pain.  No lightheadedness. MAP controlled in 70s-80s.  No BRBPR or melena.  No tea-colored urine.  LVAD parameters reviewed, stable.  No power spikes, rare PI events.   Labs (3/18): LDH 331 => 401, K 3.6, creatinine 1.98, hgb 8.3 => 8.9, INR 2.11  ECG (2/18, personally reviewed): Atrial fibrillation, RBBB  PMH: 1. CAD: s/p CABG in 2010.  2. Aortic stenosis: s/p TAVR in 2015. Valve looked ok on 2/18 echo.  3. Atrial fibrillation: Chronic.  4. Type II diabetes.  5. CKD: Stage III, cardiorenal syndrome.  6. Chronic systolic CHF: Ischemic cardiomyopathy with prominent RV dysfunction.  Medtronic ICD.  - Echo (2/18): EF 20-25%, moderately dilated LV, moderately dilated/moderately dysfunctional RV, severe TR.  - Cardiogenic shock 2/18 requiring milrinone, norepinephrine, and IABP.  - Heartmate  II LVAD for DT placed 12/13/16.   7. OSA.  8. Thrombocytopenia: Post-op LVAD, resolved.  9. Tricuspid regurgitation: Severe, s/p repair with LVAD placement in 2/18.   Social History   Social History  . Marital status: Widowed    Spouse name: N/A  . Number of children: N/A  . Years of education: N/A   Occupational History  . Not on file.   Social History Main Topics  . Smoking status: Never Smoker  . Smokeless tobacco: Never Used  . Alcohol use No  . Drug use: No  . Sexual activity: Not on file   Other Topics Concern  . Not on file   Social History Narrative   Patient is a widow. Moved to [place] from [place], [place]. Currently lives with his sister Rondal Vandevelde.    Family History  Problem Relation Age of Onset  . Heart failure Father   . Heart attack Father    ROS: All systems reviewed and negative except as per HPI.   Current Outpatient Prescriptions  Medication Sig Dispense Refill  . albuterol (PROVENTIL HFA;VENTOLIN HFA) 108 (90 Base) MCG/ACT inhaler Inhale 2 puffs into the lungs every 4 (four) hours as needed for wheezing or shortness of breath. 1 Inhaler  5  . albuterol (PROVENTIL) (2.5 MG/3ML) 0.083% nebulizer solution Take 3 mLs (2.5 mg total) by nebulization every 4 (four) hours. And as needed 150 mL 5  . ascorbic acid (VITAMIN C) 1000 MG tablet Take 1,000 mg by mouth daily.    Marland Kitchen aspirin EC 81 MG tablet Take 81 mg by mouth daily.    Marland Kitchen atorvastatin (LIPITOR) 40 MG tablet Take 1 tablet (40 mg total) by mouth daily. 30 tablet 6  . benzonatate (TESSALON) 100 MG capsule Take by mouth 3 (three) times daily as needed for cough.    . busPIRone (BUSPAR) 5 MG tablet Take 1 tablet (5 mg total) by mouth 2 (two) times daily. 60 tablet 6  . docusate sodium (COLACE) 100 MG capsule Take 2 capsules (200 mg total) by mouth daily. 30 capsule 0  . fluticasone (FLONASE) 50 MCG/ACT nasal spray Place into both nostrils daily.    . fluticasone (FLOVENT HFA) 110 MCG/ACT inhaler  Inhale 2 puffs into the lungs 2 (two) times daily. 1 Inhaler 5  . gabapentin (NEURONTIN) 600 MG tablet Take 0.5 tablets (300 mg total) by mouth 2 (two) times daily. 60 tablet 6  . insulin glargine (LANTUS) 100 UNIT/ML injection Inject 0.2 mLs (20 Units total) into the skin at bedtime. 10 mL 1  . levothyroxine (SYNTHROID, LEVOTHROID) 25 MCG tablet Take 1 tablet (25 mcg total) by mouth daily before breakfast. 30 tablet 6  . Multiple Vitamin (MULTIVITAMIN) tablet Take 1 tablet by mouth daily.    . pantoprazole (PROTONIX) 40 MG tablet Take 1 tablet (40 mg total) by mouth daily at 12 noon. 30 tablet 6  . sildenafil (REVATIO) 20 MG tablet Take 1 tablet (20 mg total) by mouth 3 (three) times daily. 90 tablet 6  . Tiotropium Bromide-Olodaterol (STIOLTO RESPIMAT) 2.5-2.5 MCG/ACT AERS Inhale 2 puffs into the lungs daily.    Marland Kitchen torsemide (DEMADEX) 20 MG tablet Take 3 tablets (60 mg total) by mouth daily. 90 tablet 6  . traMADol (ULTRAM) 50 MG tablet Take 1 tablet (50 mg total) by mouth every 6 (six) hours as needed for moderate pain. 30 tablet 0  . traZODone (DESYREL) 50 MG tablet Take 1 tablet (50 mg total) by mouth at bedtime as needed for sleep. 30 tablet 0  . warfarin (COUMADIN) 5 MG tablet Take 1.5 tablets (7.5 mg total) by mouth daily. 45 tablet 6   No current facility-administered medications for this encounter.    MAP 82, HR 74 General: NAD Neck: JVP 7 cm, no thyromegaly or thyroid nodule.  Lungs: Slight crackles at bases bilaterally.  CV: LVAD hum.  1+ edema to knees bilaterally.  No carotid bruit.   Abdomen: Soft, nontender, no hepatosplenomegaly, no distention.  Skin: Intact without lesions or rashes.  Neurologic: Alert and oriented x 3.  Psych: Normal affect. Extremities: No clubbing or cyanosis.  HEENT: Normal.   Assessment/Plan: 1. Chronic systolic CHF: Ischemic cardiomyopathy, EF 20-25% with RV dysfunction on 2/18 echo pre-LVAD.  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  Medtronic ICD.  He still  has prominent peripheral edema but minimal JVD.  He feels much better post-LVAD, NYHA class II.  LVAD parameters reviewed today and stable.  No power spikes.  Of concern, LDH is higher today at 401 (331 at discharge).  INR has been therapeutic and he is on ASA.  No tea-colored urine.  MAP stable.  - Continue torsemide 60 mg daily.   - Wear compression stockings.   - Continue ASA 81 + warfarin  with goal INR 2-2.5.  2. CAD: s/p CABG.  No chest pain.  He is on ASA 81 and atorvastatin.  3. CKD: Stage III.  Creatinine today is at his baseline.  4. Atrial fibrillation: Chronic.  On warfarin.  5. 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

## 2017-01-10 ENCOUNTER — Other Ambulatory Visit (HOSPITAL_COMMUNITY): Payer: Self-pay | Admitting: Infectious Diseases

## 2017-01-10 DIAGNOSIS — E119 Type 2 diabetes mellitus without complications: Secondary | ICD-10-CM

## 2017-01-10 DIAGNOSIS — Z794 Long term (current) use of insulin: Principal | ICD-10-CM

## 2017-01-10 MED ORDER — INSULIN GLARGINE 100 UNIT/ML ~~LOC~~ SOLN: 20.0000 [IU] | Freq: Every day | SUBCUTANEOUS | 1 refills | Status: DC

## 2017-01-10 MED ORDER — INSULIN GLARGINE 100 UNITS/ML SOLOSTAR PEN: 20.0000 [IU] | PEN_INJECTOR | Freq: Every day | SUBCUTANEOUS | 0 refills | Status: DC

## 2017-01-10 NOTE — Telephone Encounter (Signed)
Call returned to [name] per VM request - prescription sent yesterday was for the Lantus vial. He is used to the [employer] Pen and requested this x 1 box until PCP appt. I called pharmacy to cancel the vial and change to the pen with same directions of 20 units QHS. He also reports he has enough short acting insulin to get him to his PCP appt. Informed him that should he find he needs additional medication to please call and we can fill a one time prescription.   Verbalized understanding.

## 2017-01-16 ENCOUNTER — Ambulatory Visit (HOSPITAL_COMMUNITY): Payer: Self-pay | Admitting: Pharmacist

## 2017-01-16 ENCOUNTER — Ambulatory Visit (HOSPITAL_COMMUNITY)
Admission: RE | Admit: 2017-01-16 | Discharge: 2017-01-16 | Disposition: A | Discharge status: Home / Self Care | Payer: Medicare Other | Source: Ambulatory Visit | Attending: Cardiology | Admitting: Cardiology

## 2017-01-16 VITALS — BP 102/79 | HR 77 | Ht 65.0 in | Wt 189.2 lb

## 2017-01-16 DIAGNOSIS — I130 Hypertensive heart and chronic kidney disease with heart failure and stage 1 through stage 4 chronic kidney disease, or unspecified chronic kidney disease: Secondary | ICD-10-CM | POA: Insufficient documentation

## 2017-01-16 DIAGNOSIS — Z95811 Presence of heart assist device: Secondary | ICD-10-CM | POA: Diagnosis not present

## 2017-01-16 DIAGNOSIS — I5023 Acute on chronic systolic (congestive) heart failure: Secondary | ICD-10-CM | POA: Diagnosis not present

## 2017-01-16 DIAGNOSIS — J45909 Unspecified asthma, uncomplicated: Secondary | ICD-10-CM | POA: Insufficient documentation

## 2017-01-16 DIAGNOSIS — I480 Paroxysmal atrial fibrillation: Secondary | ICD-10-CM | POA: Diagnosis not present

## 2017-01-16 DIAGNOSIS — E1122 Type 2 diabetes mellitus with diabetic chronic kidney disease: Secondary | ICD-10-CM | POA: Insufficient documentation

## 2017-01-16 DIAGNOSIS — I255 Ischemic cardiomyopathy: Secondary | ICD-10-CM | POA: Insufficient documentation

## 2017-01-16 DIAGNOSIS — Z7901 Long term (current) use of anticoagulants: Secondary | ICD-10-CM | POA: Diagnosis not present

## 2017-01-16 DIAGNOSIS — Z79899 Other long term (current) drug therapy: Secondary | ICD-10-CM | POA: Insufficient documentation

## 2017-01-16 DIAGNOSIS — I2510 Atherosclerotic heart disease of native coronary artery without angina pectoris: Secondary | ICD-10-CM | POA: Insufficient documentation

## 2017-01-16 DIAGNOSIS — G4733 Obstructive sleep apnea (adult) (pediatric): Secondary | ICD-10-CM | POA: Insufficient documentation

## 2017-01-16 DIAGNOSIS — N189 Chronic kidney disease, unspecified: Secondary | ICD-10-CM

## 2017-01-16 DIAGNOSIS — Z9889 Other specified postprocedural states: Secondary | ICD-10-CM | POA: Insufficient documentation

## 2017-01-16 DIAGNOSIS — Z794 Long term (current) use of insulin: Secondary | ICD-10-CM | POA: Insufficient documentation

## 2017-01-16 DIAGNOSIS — Z7982 Long term (current) use of aspirin: Secondary | ICD-10-CM | POA: Insufficient documentation

## 2017-01-16 DIAGNOSIS — N183 Chronic kidney disease, stage 3 (moderate): Secondary | ICD-10-CM | POA: Insufficient documentation

## 2017-01-16 DIAGNOSIS — I5022 Chronic systolic (congestive) heart failure: Secondary | ICD-10-CM | POA: Diagnosis not present

## 2017-01-16 DIAGNOSIS — Z951 Presence of aortocoronary bypass graft: Secondary | ICD-10-CM | POA: Diagnosis not present

## 2017-01-16 DIAGNOSIS — I482 Chronic atrial fibrillation: Secondary | ICD-10-CM | POA: Diagnosis not present

## 2017-01-16 LAB — BASIC METABOLIC PANEL
Anion gap: 15 (ref 5–15)
BUN: 38 mg/dL — AB (ref 6–20)
CHLORIDE: 92 mmol/L — AB (ref 101–111)
CO2: 29 mmol/L (ref 22–32)
CREATININE: 1.74 mg/dL — AB (ref 0.61–1.24)
Calcium: 8.6 mg/dL — ABNORMAL LOW (ref 8.9–10.3)
GFR calc Af Amer: 44 mL/min — ABNORMAL LOW (ref >60)
GFR calc non Af Amer: 38 mL/min — ABNORMAL LOW (ref >60)
GLUCOSE: 199 mg/dL — AB (ref 65–99)
POTASSIUM: 3.2 mmol/L — AB (ref 3.5–5.1)
SODIUM: 136 mmol/L (ref 135–145)

## 2017-01-16 LAB — CBC
HEMATOCRIT: 27.2 % — AB (ref 39.0–52.0)
Hemoglobin: 8.7 g/dL — ABNORMAL LOW (ref 13.0–17.0)
MCH: 30.3 pg (ref 26.0–34.0)
MCHC: 32 g/dL (ref 30.0–36.0)
MCV: 94.8 fL (ref 78.0–100.0)
PLATELETS: 137 10*3/uL — AB (ref 150–400)
RBC: 2.87 MIL/uL — ABNORMAL LOW (ref 4.22–5.81)
RDW: 19.3 % — ABNORMAL HIGH (ref 11.5–15.5)
WBC: 8.5 10*3/uL (ref 4.0–10.5)

## 2017-01-16 LAB — PROTIME-INR
INR: 2.41
Prothrombin Time: 26.7 seconds — ABNORMAL HIGH (ref 11.4–15.2)

## 2017-01-16 LAB — LACTATE DEHYDROGENASE: LDH: 443 U/L — AB (ref 98–192)

## 2017-01-16 MED ORDER — ASPIRIN 325 MG PO TABS: 325.0000 mg | ORAL_TABLET | Freq: Every day | ORAL | Status: DC

## 2017-01-16 MED ORDER — AMLODIPINE BESYLATE 2.5 MG PO TABS: 2.5000 mg | ORAL_TABLET | Freq: Every day | ORAL | 6 refills | Status: DC

## 2017-01-16 MED ORDER — SPIRONOLACTONE 25 MG PO TABS: 12.5000 mg | ORAL_TABLET | Freq: Every day | ORAL | 6 refills | Status: DC

## 2017-01-16 NOTE — Progress Notes (Signed)
Patient presents for 1 week  follow up in [facility] today. Reports no problems with VAD equipment or concerns with drive line.  Vital Signs:  Doppler Pressure: 102  Automatc BP: 102/579 (95) HR: 77  SPO2:98  %  Height:5'5" Weight: 189.2 lb w/ eqt Last weight: 191.4 lb Home weights: 185-188 lbs w/o eqt  VAD Indication: Destination Therapy    VAD interrogation & Equipment Management (Reviewed with [name]): Speed:9000 Flow: 5.8 Power:5.6 w    PI:6.0  Alarms: no clinical alarms Events: rare   Fixed speed 9000 Low speed limit: 8800  Primary Controller:  Replace back up battery in 21 months. Back up controller:   Replace back up battery in 21 months.  I reviewed the LVAD parameters from today and compared the results to the patient's prior recorded data. LVAD interrogation was NEGATIVE for significant power changes, NEGATIVE for clinical alarms and STABLE for PI events/speed drops. No programming changes were made and pump is functioning within specified parameters. Pt is performing daily controller and system monitor self tests along with completing weekly and monthly maintenance for LVAD equipment.  LVAD equipment check completed and is in good working order. Back-up equipment present. Charged back up battery and performed self-test on equipment.   Exit Site Care: Drive line is being maintained weekly by sisters. Drive line exit site well healed and incorporated.  The velour is fully implanted at exit site. Proximal and Distal stitch still present. Proximal stitch removed in clinic today. Dressing dry and intact.  VAD dressing removed and site care performed using sterile technique. Drive line exit site cleaned with Chlora prep applicators x 2, allowed to dry, skin protectant applied and allowed to dry before Gauze dressing and aquaseal re-applied. Exit site well healed and incorporated.The velour is fully implanted at exit site. Slight redness,no tenderness, small  amount of brownish yellow crust around driveline, no foul odor noted. Pt denies fever or chills. Driveline dressing is being changed daily per sterile technique. Pt denies fever or chills. Caregiver instructed to decrease dressing change to every other day. Caregiver stated that she didn't need any dressing kits at this time. Pt was given a box of adhesive removers today per their request.   Back-up equipment present. LVAD education done on emergency procedures and precautions and reviewed exit site care.   Significant Events on VAD Support:  12/13/16 VAD implant D/C on 2/28  Device: medtronic Therapies: on VT monitor on@150 , VF @ 230 Last check:    BP & Labs:  MAP-102 - Doppler   Hgb 8.7- No S/S of bleeding. Specifically denies melena/BRBPR or nosebleeds.  LDH increased to 443. [name] DC was 331, we will increase his ASA to 325 mg daily per [name]. Patient Instructions:  1. Begin Norvasc 2.5 mg Daily.  2. Referral sent to Cardiac Rehab at [name].  3. Caregiver informed to decrease dressing change to every other day. 4. Increase ASA to 325 mg and return to clinic next week for LDH and other labs.  5. Begin Spiro 12.5 mg daily.  6. Return to clinic in 2 weeks.    Trevor Rockers RN, BSN VAD Coordinator   Office: [phone] 24/7 Emergency VAD Pager: [phone]    Cardiology: Dr. Riley Lam [name] a 71 y.o.malewith a history of CAD s/p CABG x 4 2010, OSA, AS with TAVR 2015, CKD, DM2, paroxysmal atrial fibrillation, asthma, and ischemic cardiomyopathy with Medtronic ICD.   Relocated to [name] from [name] in  early 2018. Over the last couple of years, he had been admitted multiple times for CHF. Last admission in [place] was in 1/18. Sounds like the hospitalization was complicated by cardiorenal syndrome and there was consideration of doing dialysis.  He was admitted in to [name] 11/27/16 with NYHA class IV symptoms and hypotension. He was  placed on dual inotropes to facilitate diuresis, cardiac output and renal function.  CT surgery consulted for mechanical support and he was deemed appropriate for LVAD work up. He completed LVAD work up and was approved for HMII LVAD --> DT. On 12/11/16, he had IABP placed to optimize cardiac output and improve renal function. He then underwent HMII LVAD placement + tricuspid valve repair on 12/14/2015. Post operatively pressors weaned off slowly. He required significant diuresis.  Course was complicated by RV dysfunction and renal dysfunction.  He continues to do well symptomatically.  Much less short of breath with exertion than before LVAD was placed. Still has peripheral edema.  MAP running a bit high today, 95. Weight down 2 lbs.  No lightheadedness.  No BRBPR or melena.  No tea-colored urine.  LVAD parameters reviewed, stable.  No power spikes, very rare PI events.   Labs (3/18): LDH 331 => 401 => 443, K 3.6 => 3.2, creatinine 1.98 => 1.74, hgb 8.3 => 8.9 => 8.7, INR 2.11 => 2.4  PMH: 1. CAD: s/p CABG in 2010.  2. Aortic stenosis: s/p TAVR in 2015. Valve looked ok on 2/18 echo.  3. Atrial fibrillation: Chronic.  4. Type II diabetes.  5. CKD: Stage III, cardiorenal syndrome.  6. Chronic systolic CHF: Ischemic cardiomyopathy with prominent RV dysfunction.  Medtronic ICD.  - Echo (2/18): EF 20-25%, moderately dilated LV, moderately dilated/moderately dysfunctional RV, severe TR.  - Cardiogenic shock 2/18 requiring milrinone, norepinephrine, and IABP.  - Heartmate II LVAD for DT placed 12/13/16.   7. OSA.  8. Thrombocytopenia: Post-op LVAD, resolved.  9. Tricuspid regurgitation: Severe, s/p repair with LVAD placement in 2/18.   Social History   Social History  . Marital status: Widowed    Spouse name: N/A  . Number of children: N/A  . Years of education: N/A   Occupational History  . Not on file.   Social History Main Topics  . Smoking status: Never Smoker  . Smokeless tobacco: Never  Used  . Alcohol use No  . Drug use: No  . Sexual activity: Not on file   Other Topics Concern  . Not on file   Social History Narrative   Patient is a widow. Moved to [name] from [name], [place]. Currently lives with his sister [name].    Family History  Problem Relation Age of Onset  . Heart failure Father   . Heart attack Father    ROS: All systems reviewed and negative except as per HPI.   Current Outpatient Prescriptions  Medication Sig Dispense Refill  . albuterol (PROVENTIL HFA;VENTOLIN HFA) 108 (90 Base) MCG/ACT inhaler Inhale 2 puffs into the lungs every 4 (four) hours as needed for wheezing or shortness of breath. 1 Inhaler 5  . albuterol (PROVENTIL) (2.5 MG/3ML) 0.083% nebulizer solution Take 3 mLs (2.5 mg total) by nebulization every 4 (four) hours. And as needed 150 mL 5  . ascorbic acid (VITAMIN C) 1000 MG tablet Take 1,000 mg by mouth daily.    Marland Kitchen atorvastatin (LIPITOR) 40 MG tablet Take 1 tablet (40 mg total) by mouth daily. 30 tablet 6  . benzonatate (TESSALON) 100  MG capsule Take by mouth 3 (three) times daily as needed for cough.    . busPIRone (BUSPAR) 5 MG tablet Take 1 tablet (5 mg total) by mouth 2 (two) times daily. 60 tablet 6  . docusate sodium (COLACE) 100 MG capsule Take 2 capsules (200 mg total) by mouth daily. 30 capsule 0  . fluticasone (FLONASE) 50 MCG/ACT nasal spray Place into both nostrils daily.    . fluticasone (FLOVENT HFA) 110 MCG/ACT inhaler Inhale 2 puffs into the lungs 2 (two) times daily. 1 Inhaler 5  . gabapentin (NEURONTIN) 600 MG tablet Take 0.5 tablets (300 mg total) by mouth 2 (two) times daily. 60 tablet 6  . insulin glargine (LANTUS) 100 unit/mL SOPN Inject 0.2 mLs (20 Units total) into the skin at bedtime. 15 mL 0  . levothyroxine (SYNTHROID, LEVOTHROID) 25 MCG tablet Take 1 tablet (25 mcg total) by mouth daily before breakfast. 30 tablet 6  . Multiple Vitamin (MULTIVITAMIN) tablet Take 1 tablet by mouth daily.    .  pantoprazole (PROTONIX) 40 MG tablet Take 1 tablet (40 mg total) by mouth daily at 12 noon. 30 tablet 6  . sildenafil (REVATIO) 20 MG tablet Take 1 tablet (20 mg total) by mouth 3 (three) times daily. 90 tablet 6  . torsemide (DEMADEX) 20 MG tablet Take 3 tablets (60 mg total) by mouth daily. 90 tablet 6  . traMADol (ULTRAM) 50 MG tablet Take 1 tablet (50 mg total) by mouth every 6 (six) hours as needed for moderate pain. 30 tablet 0  . traZODone (DESYREL) 50 MG tablet Take 1 tablet (50 mg total) by mouth at bedtime as needed for sleep. 30 tablet 0  . warfarin (COUMADIN) 5 MG tablet Take 1.5 tablets (7.5 mg total) by mouth daily. 45 tablet 6  . amLODipine (NORVASC) 2.5 MG tablet Take 1 tablet (2.5 mg total) by mouth daily. 30 tablet 6  . spironolactone (ALDACTONE) 25 MG tablet Take 0.5 tablets (12.5 mg total) by mouth daily. 30 tablet 6  . Tiotropium Bromide-Olodaterol (STIOLTO RESPIMAT) 2.5-2.5 MCG/ACT AERS Inhale 2 puffs into the lungs daily.     Current Facility-Administered Medications  Medication Dose Route Frequency Provider Last Rate Last Dose  . aspirin tablet 325 mg  325 mg Oral Daily Christinia Gully, RN       BP 102/79 Comment: 95 map  Pulse 77   Ht 5\' 5"  (1.651 m)   Wt 189 lb 3.2 oz (85.8 kg)   SpO2 98%   BMI 31.48 kg/m  General: NAD Neck: JVP 8 cm, no thyromegaly or thyroid nodule.  Lungs: CTAB  CV: LVAD hum.  1+ edema 1/2 to knees bilaterally.  No carotid bruit.   Abdomen: Soft, nontender, no hepatosplenomegaly, no distention.  Skin: Intact without lesions or rashes.  Neurologic: Alert and oriented x 3.  Psych: Normal affect. Extremities: No clubbing or cyanosis.  HEENT: Normal.   Assessment/Plan: 1. Chronic systolic CHF: Ischemic cardiomyopathy, EF 20-25% with RV dysfunction on 2/18 echo pre-LVAD.  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  Medtronic ICD.  He feels much better post-LVAD, NYHA class II. He still has peripheral edema but weight is down 2 lbs.  LVAD parameters reviewed  today and stable.  Of concern, LDH continues to trend up slowly, 443 today (331 at discharge).  INR has been therapeutic and he is on ASA.  No tea-colored urine, no power spikes.  MAP stable.  - Continue torsemide 60 mg daily.   - Continue warfarin, will increase  ASA to 325 mg daily.  - I will have him start cardiac rehab.   - With low K and elevated MAP, add spironolactone 12.5 daily.  2. CAD: s/p CABG.  No chest pain.  He is on ASA and atorvastatin.  3. CKD: Stage III.  Creatinine today is at his baseline.  He has nephrology followup.  4. Atrial fibrillation: Chronic.  On warfarin.  5. 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  6. HTN: MAP 95 today.  Add amlodipine 2.5 mg daily.    Followup in 2 wks.   Loralie Champagne 01/16/2017

## 2017-01-17 ENCOUNTER — Ambulatory Visit (INDEPENDENT_AMBULATORY_CARE_PROVIDER_SITE_OTHER): Payer: Medicare Other | Admitting: Family

## 2017-01-17 ENCOUNTER — Encounter: Payer: Self-pay | Admitting: Family

## 2017-01-17 VITALS — BP 108/68 | HR 53 | Temp 98.5°F | Resp 16 | Ht 65.0 in | Wt 184.0 lb

## 2017-01-17 DIAGNOSIS — E119 Type 2 diabetes mellitus without complications: Secondary | ICD-10-CM | POA: Diagnosis not present

## 2017-01-17 DIAGNOSIS — J449 Chronic obstructive pulmonary disease, unspecified: Secondary | ICD-10-CM | POA: Diagnosis not present

## 2017-01-17 DIAGNOSIS — R946 Abnormal results of thyroid function studies: Secondary | ICD-10-CM

## 2017-01-17 DIAGNOSIS — R7989 Other specified abnormal findings of blood chemistry: Secondary | ICD-10-CM

## 2017-01-17 DIAGNOSIS — N183 Chronic kidney disease, stage 3 unspecified: Secondary | ICD-10-CM

## 2017-01-17 DIAGNOSIS — Z95811 Presence of heart assist device: Secondary | ICD-10-CM

## 2017-01-17 DIAGNOSIS — N2889 Other specified disorders of kidney and ureter: Secondary | ICD-10-CM

## 2017-01-17 DIAGNOSIS — I255 Ischemic cardiomyopathy: Secondary | ICD-10-CM | POA: Diagnosis not present

## 2017-01-17 DIAGNOSIS — Z794 Long term (current) use of insulin: Secondary | ICD-10-CM | POA: Diagnosis not present

## 2017-01-17 DIAGNOSIS — G4733 Obstructive sleep apnea (adult) (pediatric): Secondary | ICD-10-CM

## 2017-01-17 NOTE — Progress Notes (Signed)
Subjective:    Patient ID: Trevor [name], male    DOB: 1946-03-01, 71 y.o.   MRN: [id number]  Chief Complaint  Patient presents with  . Establish Care    Diabetes, sleep apnea,    HPI:  Trevor [name] is a 71 y.o. male who  has a past medical history of Asthma; CHF (congestive heart failure) ([place]); Diabetes ([place]); Kidney disease; and Sleep apnea. and presents today for an office visit to establish care.  1.) Type 2 diabetes - Currently maintained on Lantus. Reports taking the medication as prescribed and denies adverse side effects or hypoglycemic readings. Blood sugars at home with the highest 150 and usually in the lower 120. Denies new symptoms of end organ damage. No excessive hunger, thirst, or urination. Working on a low/carbohydrate modified oral intake.   Lab Results  Component Value Date   HGBA1C 7.1 (H) 12/03/2016    Lab Results  Component Value Date   CREATININE 1.74 (H) 01/16/2017   BUN 38 (H) 01/16/2017   NA 136 01/16/2017   K 3.2 (L) 01/16/2017   CL 92 (L) 01/16/2017   CO2 29 01/16/2017    2.) Sleep Apnea - Reports that he has been sleeping without the CPAP since leaving the hospital and reports that he has been sleeping well with no problems with PND. Feels well rested when he wakes up.   3.) COPD - Currently maintained on Flovent and albuterol. Reports taking these medications as prescribed and denies adverse side effects. Does not currently take the [place]. Symptoms are adequately controlled with the current medication. Denies shortness of breath, cough or wheezing.   4.) Hypothyroidism - Currently maintained on levothyroxine. Reports taking the medication as prescribed denies adverse side effects. Denies temperature intolerance or changes to skin, hair or nails.   Lab Results  Component Value Date   TSH 3.621 12/03/2016     Allergies  Allergen Reactions  . No Known Allergies       Outpatient Medications Prior to Visit  Medication Sig  Dispense Refill  . albuterol (PROVENTIL HFA;VENTOLIN HFA) 108 (90 Base) MCG/ACT inhaler Inhale 2 puffs into the lungs every 4 (four) hours as needed for wheezing or shortness of breath. 1 Inhaler 5  . albuterol (PROVENTIL) (2.5 MG/3ML) 0.083% nebulizer solution Take 3 mLs (2.5 mg total) by nebulization every 4 (four) hours. And as needed 150 mL 5  . ascorbic acid (VITAMIN C) 1000 MG tablet Take 1,000 mg by mouth daily.    Marland Kitchen atorvastatin (LIPITOR) 40 MG tablet Take 1 tablet (40 mg total) by mouth daily. 30 tablet 6  . benzonatate (TESSALON) 100 MG capsule Take by mouth 3 (three) times daily as needed for cough.    . busPIRone (BUSPAR) 5 MG tablet Take 1 tablet (5 mg total) by mouth 2 (two) times daily. 60 tablet 6  . docusate sodium (COLACE) 100 MG capsule Take 2 capsules (200 mg total) by mouth daily. 30 capsule 0  . fluticasone (FLOVENT HFA) 110 MCG/ACT inhaler Inhale 2 puffs into the lungs 2 (two) times daily. 1 Inhaler 5  . fluticasone (FLONASE) 50 MCG/ACT nasal spray Place into both nostrils daily.    Marland Kitchen amLODipine (NORVASC) 2.5 MG tablet Take 1 tablet (2.5 mg total) by mouth daily. 30 tablet 6  . gabapentin (NEURONTIN) 600 MG tablet Take 0.5 tablets (300 mg total) by mouth 2 (two) times daily. 60 tablet 6  . insulin glargine (LANTUS) 100 unit/mL SOPN Inject 0.2 mLs (20 Units  total) into the skin at bedtime. 15 mL 0  . levothyroxine (SYNTHROID, LEVOTHROID) 25 MCG tablet Take 1 tablet (25 mcg total) by mouth daily before breakfast. 30 tablet 6  . Multiple Vitamin (MULTIVITAMIN) tablet Take 1 tablet by mouth daily.    . pantoprazole (PROTONIX) 40 MG tablet Take 1 tablet (40 mg total) by mouth daily at 12 noon. 30 tablet 6  . sildenafil (REVATIO) 20 MG tablet Take 1 tablet (20 mg total) by mouth 3 (three) times daily. 90 tablet 6  . spironolactone (ALDACTONE) 25 MG tablet Take 0.5 tablets (12.5 mg total) by mouth daily. 30 tablet 6  . Tiotropium Bromide-Olodaterol (STIOLTO RESPIMAT) 2.5-2.5 MCG/ACT  AERS Inhale 2 puffs into the lungs daily.    Marland Kitchen torsemide (DEMADEX) 20 MG tablet Take 3 tablets (60 mg total) by mouth daily. 90 tablet 6  . traMADol (ULTRAM) 50 MG tablet Take 1 tablet (50 mg total) by mouth every 6 (six) hours as needed for moderate pain. 30 tablet 0  . traZODone (DESYREL) 50 MG tablet Take 1 tablet (50 mg total) by mouth at bedtime as needed for sleep. 30 tablet 0  . warfarin (COUMADIN) 5 MG tablet Take 1.5 tablets (7.5 mg total) by mouth daily. 45 tablet 6   No facility-administered medications prior to visit.      Past Medical History:  Diagnosis Date  . Asthma   . CHF (congestive heart failure) ([place])   . Diabetes ([place])   . Kidney disease   . Sleep apnea       Past Surgical History:  Procedure Laterality Date  . CARDIAC CATHETERIZATION N/A 12/02/2016   Procedure: Right Heart Cath;  Surgeon: Larey Dresser, MD;  Location: [facility];  Service: Cardiovascular;  Laterality: N/A;  . IABP INSERTION N/A 12/11/2016   Procedure: IABP Insertion;  Surgeon: Larey Dresser, MD;  Location: [facility];  Service: Cardiovascular;  Laterality: N/A;  . INSERTION OF IMPLANTABLE LEFT VENTRICULAR ASSIST DEVICE N/A 12/13/2016   Procedure: INSERTION OF IMPLANTABLE LEFT VENTRICULAR ASSIST DEVICE;  Surgeon: Ivin Poot, MD;  Location: [facility];  Service: Open Heart Surgery;  Laterality: N/A;  HEARTMATE II  NITRIC OXIDE  . RIGHT HEART CATH N/A 12/11/2016   Procedure: Right Heart Cath;  Surgeon: Larey Dresser, MD;  Location: [facility];  Service: Cardiovascular;  Laterality: N/A;  . TEE WITHOUT CARDIOVERSION N/A 12/13/2016   Procedure: TRANSESOPHAGEAL ECHOCARDIOGRAM (TEE);  Surgeon: Ivin Poot, MD;  Location: [facility];  Service: Open Heart Surgery;  Laterality: N/A;  . TRICUSPID VALVE REPLACEMENT N/A 12/13/2016   Procedure: TRICUSPID VALVE REPAIR WITH EDWARDS MC 3 TRICUSPID ANNULOPLASTY RING MODEL 4900 SIZE T 28;  Surgeon: Ivin Poot, MD;  Location: [facility];   Service: Open Heart Surgery;  Laterality: N/A;      Family History  Problem Relation Age of Onset  . Heart failure Father   . Heart attack Father   . Healthy Mother   . Diabetes Paternal Grandfather       Social History   Social History  . Marital status: Widowed    Spouse name: N/A  . Number of children: 0  . Years of education: 14   Occupational History  . Not on file.   Social History Main Topics  . Smoking status: Never Smoker  . Smokeless tobacco: Never Used  . Alcohol use No  . Drug use: No  . Sexual activity: Not on file  Other Topics Concern  . Not on file   Social History Narrative   Patient is a widow. Moved to [place] from [place], [place]. Currently lives with his sister Arianna Delsanto.    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       Review of Systems  Constitutional: Negative for chills and fever.  Eyes:       Negative for changes in vision  Respiratory: Negative for cough, chest tightness, shortness of breath and wheezing.   Cardiovascular: Negative for chest pain, palpitations and leg swelling.  Endocrine: Negative for polydipsia, polyphagia and polyuria.  Neurological: Negative for dizziness, weakness, light-headedness and headaches.       Objective:    BP 108/68 (BP Location: Left Arm, Patient Position: Sitting, Cuff Size: Normal)   Pulse (!) 53   Temp 98.5 F (36.9 C) (Oral)   Resp 16   Ht 5' 5"  (1.651 m)   Wt 184 lb (83.5 kg)   SpO2 97%   BMI 30.62 kg/m  Nursing note and vital signs reviewed.  Physical Exam  Constitutional: He is oriented to person, place, and time. He appears well-developed and well-nourished. No distress.  Cardiovascular: Regular rhythm and intact distal pulses.   LVAD functional.   Pulmonary/Chest: Effort normal and breath sounds normal. No respiratory distress. He has no wheezes. He has no rales. He exhibits no tenderness.  Neurological: He is alert and oriented to person, place, and time.    Diabetic Foot Exam - Simple   Simple Foot Form Diabetic Foot exam was performed with the following findings:  Yes  01/17/2017 12:21 PM  Visual Inspection No deformities, no ulcerations, no other skin breakdown bilaterally:  Yes Sensation Testing Intact to touch and monofilament testing bilaterally:  Yes Pulse Check Posterior Tibialis and Dorsalis pulse intact bilaterally:  Yes  Skin: Skin is warm and dry.  Psychiatric: He has a normal mood and affect. His behavior is normal. Judgment and thought content normal.        Assessment & Plan:   Problem List Items Addressed This Visit      Respiratory   OSA (obstructive sleep apnea)    Sleep apnea with no current symptoms and not maintained on positive airway pressure. Reports sleeping well with no hypersomnolence. Continue off positive airway pressure and continue to monitor.       COPD (chronic obstructive pulmonary disease) (HCC)    Stable with no signs of exacerbation and well maintained with current medication. Continue albuterol and fluticasone. Consider pulmonary function test. Continue to monitor.        Endocrine   Type 2 diabetes mellitus ([place])    Most recent A1c of 7.1 appears stable with current dosage of Lantus and no adverse side effects or hypoglycemic readings. Diabetic foot exam completed today. Will complete hemoglobin A1c and urine microalbumin at next office visit or through blood work with cardiology. Maintained on atorvastatin for CAD risk reduction. Continue to monitor blood sugars at home 1-2 times per day. Continue current dosage of Lantus. Encouraged to complete diabetic eye exam independently.      Relevant Orders   Hemoglobin A1c     Genitourinary   Chronic renal insufficiency - Primary    Most recent eGFR in the lower 30's consistent with CKD Stage III. Continue to optimize blood sugars and maintain blood pressures. Consider referral to nephrology for worsening. Continue to monitor.         Other    LVAD (left ventricular assist  device) present [facility])    LVAD is functional without complication and no cardiac symptoms. Continue management per LVAD team.       Relevant Orders   TSH   Elevated TSH   Relevant Orders   TSH       I am having Mr. Percell Miller maintain his ascorbic acid, benzonatate, multivitamin, Tiotropium Bromide-Olodaterol, albuterol, fluticasone, albuterol, atorvastatin, docusate sodium, gabapentin, levothyroxine, pantoprazole, sildenafil, torsemide, traMADol, traZODone, warfarin, busPIRone, insulin glargine, amLODipine, and spironolactone.   Follow-up: Return in about 2 months (around 03/19/2017), or if symptoms worsen or fail to improve.  Mauricio Po, FNP

## 2017-01-17 NOTE — Assessment & Plan Note (Signed)
Most recent eGFR in the lower 30's consistent with CKD Stage III. Continue to optimize blood sugars and maintain blood pressures. Consider referral to nephrology for worsening. Continue to monitor.

## 2017-01-17 NOTE — Assessment & Plan Note (Signed)
Sleep apnea with no current symptoms and not maintained on positive airway pressure. Reports sleeping well with no hypersomnolence. Continue off positive airway pressure and continue to monitor.

## 2017-01-17 NOTE — Assessment & Plan Note (Signed)
Most recent A1c of 7.1 appears stable with current dosage of Lantus and no adverse side effects or hypoglycemic readings. Diabetic foot exam completed today. Will complete hemoglobin A1c and urine microalbumin at next office visit or through blood work with cardiology. Maintained on atorvastatin for CAD risk reduction. Continue to monitor blood sugars at home 1-2 times per day. Continue current dosage of Lantus. Encouraged to complete diabetic eye exam independently.

## 2017-01-17 NOTE — Patient Instructions (Addendum)
Thank you for choosing [facility].  SUMMARY AND INSTRUCTIONS:  Keep taking medications as prescribed.  Medication:  Your prescription(s) have been submitted to your pharmacy or been printed and provided for you. Please take as directed and contact our office if you believe you are having problem(s) with the medication(s) or have any questions.  Follow up:  If your symptoms worsen or fail to improve, please contact our office for further instruction, or in case of emergency go directly to the emergency room at the closest medical facility.   DIABETES CARE INSTRUCTIONS:  Current A1c:  Lab Results  Component Value Date   HGBA1C 7.1 (H) 12/03/2016    A1C Goal: <7.0%  Fasting Blood Sugar Goal: 80-130 Blood sugar check that you take after fasting for at least 8 hours which is usually in the morning before eating.  Post-prandial Blood Sugar Goal: <180 Blood sugar check approximately 1-2 hours after the start of a meal.   Diabetes Prevention Screens:  1.) Ensure you have an annual eye exam by an ophthalmologist or optometrist.   2,) Ensure you have an annual foot exam or when any changes are noted including new onset numbness/tingling or wounds. Check your feet daily!  3.) The American Diabetes Association recommends the Pneumovax vaccination against pneumonia every 5 years.  4.) We will check your kidney function with a urine test on an annual basis.

## 2017-01-17 NOTE — Assessment & Plan Note (Addendum)
Stable with no signs of exacerbation and well maintained with current medication. Continue albuterol and fluticasone. Consider pulmonary function test. Continue to monitor.

## 2017-01-17 NOTE — Assessment & Plan Note (Signed)
LVAD is functional without complication and no cardiac symptoms. Continue management per LVAD team.

## 2017-01-21 ENCOUNTER — Other Ambulatory Visit (HOSPITAL_COMMUNITY): Payer: Self-pay | Admitting: Adult Health

## 2017-01-22 ENCOUNTER — Other Ambulatory Visit (HOSPITAL_COMMUNITY): Payer: Self-pay | Admitting: Infectious Diseases

## 2017-01-22 DIAGNOSIS — Z5181 Encounter for therapeutic drug level monitoring: Secondary | ICD-10-CM

## 2017-01-22 DIAGNOSIS — Z7901 Long term (current) use of anticoagulants: Secondary | ICD-10-CM

## 2017-01-23 ENCOUNTER — Ambulatory Visit (HOSPITAL_COMMUNITY)
Admission: RE | Admit: 2017-01-23 | Discharge: 2017-01-23 | Disposition: A | Discharge status: Home / Self Care | Payer: Medicare Other | Source: Ambulatory Visit | Attending: Internal Medicine | Admitting: Internal Medicine

## 2017-01-23 ENCOUNTER — Other Ambulatory Visit (HOSPITAL_COMMUNITY): Payer: Self-pay | Admitting: Unknown Physician Specialty

## 2017-01-23 ENCOUNTER — Ambulatory Visit (HOSPITAL_COMMUNITY): Payer: Self-pay | Admitting: Pharmacist

## 2017-01-23 DIAGNOSIS — Z7901 Long term (current) use of anticoagulants: Secondary | ICD-10-CM | POA: Diagnosis not present

## 2017-01-23 DIAGNOSIS — Z5181 Encounter for therapeutic drug level monitoring: Secondary | ICD-10-CM | POA: Insufficient documentation

## 2017-01-23 DIAGNOSIS — Z95811 Presence of heart assist device: Secondary | ICD-10-CM

## 2017-01-23 LAB — PROTIME-INR
INR: 2.38
PROTHROMBIN TIME: 26.4 s — AB (ref 11.4–15.2)

## 2017-01-23 LAB — LACTATE DEHYDROGENASE: LDH: 366 U/L — AB (ref 98–192)

## 2017-01-23 LAB — BASIC METABOLIC PANEL
ANION GAP: 12 (ref 5–15)
BUN: 48 mg/dL — ABNORMAL HIGH (ref 6–20)
CALCIUM: 9.1 mg/dL (ref 8.9–10.3)
CO2: 29 mmol/L (ref 22–32)
CREATININE: 1.81 mg/dL — AB (ref 0.61–1.24)
Chloride: 96 mmol/L — ABNORMAL LOW (ref 101–111)
GFR calc Af Amer: 42 mL/min — ABNORMAL LOW (ref >60)
GFR, EST NON AFRICAN AMERICAN: 36 mL/min — AB (ref >60)
GLUCOSE: 209 mg/dL — AB (ref 65–99)
Potassium: 3.5 mmol/L (ref 3.5–5.1)
Sodium: 137 mmol/L (ref 135–145)

## 2017-01-23 MED ORDER — BENZONATATE 100 MG PO CAPS: 100.0000 mg | ORAL_CAPSULE | Freq: Three times a day (TID) | ORAL | 6 refills | Status: DC | PRN

## 2017-01-29 ENCOUNTER — Other Ambulatory Visit (HOSPITAL_COMMUNITY): Payer: Self-pay | Admitting: Unknown Physician Specialty

## 2017-01-29 DIAGNOSIS — Z7901 Long term (current) use of anticoagulants: Secondary | ICD-10-CM

## 2017-01-29 DIAGNOSIS — Z95811 Presence of heart assist device: Secondary | ICD-10-CM

## 2017-01-30 ENCOUNTER — Encounter (HOSPITAL_COMMUNITY): Payer: Self-pay | Admitting: Pharmacist

## 2017-01-30 ENCOUNTER — Ambulatory Visit (HOSPITAL_COMMUNITY)
Admission: RE | Admit: 2017-01-30 | Discharge: 2017-01-30 | Disposition: A | Discharge status: Home / Self Care | Payer: Medicare Other | Source: Ambulatory Visit | Attending: Cardiology | Admitting: Cardiology

## 2017-01-30 ENCOUNTER — Ambulatory Visit (HOSPITAL_COMMUNITY): Payer: Self-pay | Admitting: Pharmacist

## 2017-01-30 VITALS — BP 88/0 | HR 89 | Ht 65.0 in | Wt 186.2 lb

## 2017-01-30 DIAGNOSIS — Z794 Long term (current) use of insulin: Secondary | ICD-10-CM | POA: Diagnosis not present

## 2017-01-30 DIAGNOSIS — I480 Paroxysmal atrial fibrillation: Secondary | ICD-10-CM | POA: Insufficient documentation

## 2017-01-30 DIAGNOSIS — N183 Chronic kidney disease, stage 3 (moderate): Secondary | ICD-10-CM | POA: Diagnosis not present

## 2017-01-30 DIAGNOSIS — Z95811 Presence of heart assist device: Secondary | ICD-10-CM | POA: Diagnosis not present

## 2017-01-30 DIAGNOSIS — I5022 Chronic systolic (congestive) heart failure: Secondary | ICD-10-CM

## 2017-01-30 DIAGNOSIS — I482 Chronic atrial fibrillation: Secondary | ICD-10-CM | POA: Insufficient documentation

## 2017-01-30 DIAGNOSIS — Z7982 Long term (current) use of aspirin: Secondary | ICD-10-CM | POA: Diagnosis not present

## 2017-01-30 DIAGNOSIS — I255 Ischemic cardiomyopathy: Secondary | ICD-10-CM | POA: Insufficient documentation

## 2017-01-30 DIAGNOSIS — Z7901 Long term (current) use of anticoagulants: Secondary | ICD-10-CM | POA: Diagnosis not present

## 2017-01-30 DIAGNOSIS — E1122 Type 2 diabetes mellitus with diabetic chronic kidney disease: Secondary | ICD-10-CM | POA: Insufficient documentation

## 2017-01-30 DIAGNOSIS — Z951 Presence of aortocoronary bypass graft: Secondary | ICD-10-CM | POA: Diagnosis not present

## 2017-01-30 DIAGNOSIS — N189 Chronic kidney disease, unspecified: Secondary | ICD-10-CM

## 2017-01-30 DIAGNOSIS — I2510 Atherosclerotic heart disease of native coronary artery without angina pectoris: Secondary | ICD-10-CM | POA: Diagnosis not present

## 2017-01-30 DIAGNOSIS — I130 Hypertensive heart and chronic kidney disease with heart failure and stage 1 through stage 4 chronic kidney disease, or unspecified chronic kidney disease: Secondary | ICD-10-CM | POA: Insufficient documentation

## 2017-01-30 DIAGNOSIS — Z79899 Other long term (current) drug therapy: Secondary | ICD-10-CM | POA: Diagnosis not present

## 2017-01-30 DIAGNOSIS — G4733 Obstructive sleep apnea (adult) (pediatric): Secondary | ICD-10-CM | POA: Diagnosis not present

## 2017-01-30 DIAGNOSIS — D638 Anemia in other chronic diseases classified elsewhere: Secondary | ICD-10-CM | POA: Insufficient documentation

## 2017-01-30 LAB — PROTIME-INR
INR: 2.56
Prothrombin Time: 28 seconds — ABNORMAL HIGH (ref 11.4–15.2)

## 2017-01-30 LAB — CBC
HEMATOCRIT: 25.6 % — AB (ref 39.0–52.0)
HEMOGLOBIN: 8.4 g/dL — AB (ref 13.0–17.0)
MCH: 31.5 pg (ref 26.0–34.0)
MCHC: 32.8 g/dL (ref 30.0–36.0)
MCV: 95.9 fL (ref 78.0–100.0)
Platelets: 112 10*3/uL — ABNORMAL LOW (ref 150–400)
RBC: 2.67 MIL/uL — ABNORMAL LOW (ref 4.22–5.81)
RDW: 19.6 % — ABNORMAL HIGH (ref 11.5–15.5)
WBC: 7.6 10*3/uL (ref 4.0–10.5)

## 2017-01-30 LAB — BASIC METABOLIC PANEL
ANION GAP: 12 (ref 5–15)
BUN: 46 mg/dL — ABNORMAL HIGH (ref 6–20)
CO2: 30 mmol/L (ref 22–32)
Calcium: 8.8 mg/dL — ABNORMAL LOW (ref 8.9–10.3)
Chloride: 94 mmol/L — ABNORMAL LOW (ref 101–111)
Creatinine, Ser: 1.79 mg/dL — ABNORMAL HIGH (ref 0.61–1.24)
GFR calc Af Amer: 42 mL/min — ABNORMAL LOW (ref >60)
GFR, EST NON AFRICAN AMERICAN: 36 mL/min — AB (ref >60)
GLUCOSE: 151 mg/dL — AB (ref 65–99)
POTASSIUM: 3.5 mmol/L (ref 3.5–5.1)
Sodium: 136 mmol/L (ref 135–145)

## 2017-01-30 LAB — LACTATE DEHYDROGENASE: LDH: 313 U/L — ABNORMAL HIGH (ref 98–192)

## 2017-01-30 NOTE — Patient Instructions (Signed)
1. Decrease dressing changes to twice weekly. 2. Return to clinic in 2 weeks.

## 2017-01-30 NOTE — Progress Notes (Addendum)
Patient presents for 1 week  follow up in [facility] today from his hospital VAD implant stay. Reports no problems with VAD equipment or concerns with drive line.  Vital Signs:  Doppler Pressure: 88 Automatc BP: 108/69 (85) HR: 89 SPO2:98  %  Height:5'5" Weight: 186.2 lb w/o eqt Last weight: 189.2 lb Home weights: 185-188 lbs w/o eqt  VAD Indication: Destination Therapy    VAD interrogation & Equipment Management (Reviewed with [name]): Speed:9000 Flow: 6.0 Power:5.7 w    PI:5.2  Alarms: no clinical alarms Events: rare   Fixed speed 9000 Low speed limit: 8400  Primary Controller:  Replace back up battery in 21 months. Back up controller:   Replace back up battery in 21 months.  I reviewed the LVAD parameters from today and compared the results to the patient's prior recorded data. LVAD interrogation was NEGATIVE for significant power changes, NEGATIVE for clinical alarms and STABLE for PI events/speed drops. No programming changes were made and pump is functioning within specified parameters. Pt is performing daily controller and system monitor self tests along with completing weekly and monthly maintenance for LVAD equipment.  LVAD equipment check completed and is in good working order. Back-up equipment present. Charged back up battery and performed self-test on equipment.   Exit Site Care: Drive line is being maintained weekly by sisters. Drive line exit site well healed and incorporated The velour is fully implanted at exit site. Dressing dry and intact.  VAD dressing removed and site care performed using sterile technique. Drive line exit site cleaned with Chlora prep applicators x 2, allowed to dry, skin protectant applied and allowed to dry before Sorbaview dressing and aquaseal re-applied. Exit site well healed and incorporated.The velour is fully implanted at exit site. Slight redness,no tenderness, small amount of brownish yellow crust around driveline, no  foul odor noted. Pt denies fever or chills. Driveline dressing is being changed daily per sterile technique. Pt denies fever or chills. Pt instructed that he can decrease dressing changes to twice weekly.   Back-up equipment present. LVAD education done on emergency procedures and precautions and reviewed exit site care.   Significant Events on VAD Support:  12/13/16 VAD implant D/C on 2/28  Device: medtronic Therapies: on VT monitor on@150 , VF @ 230 Last check:    BP & Labs:  MAP-88 - Doppler   Hgb 8.4- No S/S of bleeding. Specifically denies melena/BRBPR or nosebleeds.  LDH stable at 313.  Pt is asking about driving. Pt was instructed that we will obtain a chest xray at next visit as we will be 8 weeks post op.   Patient Instructions:  1. Decrease dressing changes to twice weekly.  2. Return to clinic in 2 weeks.   [name], BSN VAD Coordinator   Office: [phone] 24/7 Emergency VAD Pager: [phone]    Cardiology: Dr. [name] a 71 y.o.malewith a history of CAD s/p CABG x 4 2010, OSA, AS with TAVR 2015, CKD, DM2, paroxysmal atrial fibrillation, asthma, and ischemic cardiomyopathy with Medtronic ICD.   Relocated to [place] from [place] in early 2018. Over the last couple of years, he had been admitted multiple times for CHF. Last admission in [place] was in 1/18. Sounds like the hospitalization was complicated by cardiorenal syndrome and there was consideration of doing dialysis.  He was admitted in to [facility]/27/16 with NYHA class IV symptoms and hypotension. He was placed on dual inotropes to facilitate diuresis, cardiac  output and renal function.  CT surgery consulted for mechanical support and he was deemed appropriate for LVAD work up. He completed LVAD work up and was approved for HMII LVAD --> DT. On 12/11/16, he had IABP placed to optimize cardiac output and improve renal function. He then underwent HMII LVAD placement  + tricuspid valve repair on 12/14/2015. Post operatively pressors weaned off slowly. He required significant diuresis.  Course was complicated by RV dysfunction and renal dysfunction.  He continues to do well symptomatically.  Much less short of breath with exertion than before LVAD was placed. No dyspnea with normal activities.  Can walk as far as he wants for the most part. Still has peripheral edema.  MAP improved at 85. Weight down 3 lbs.  No lightheadedness.  No BRBPR or melena.  No tea-colored urine.  LVAD parameters reviewed, stable.  No power spikes, very rare PI events.  At last appointment, ASA increased to 325 due to mildly elevated LDH. LDH has now trended down.   Labs (3/18): LDH 331 => 401 => 443 => 366 => 313, K 3.6 => 3.2 => 3.5, creatinine 1.98 => 1.74 => 1.8 => 1.79, hgb 8.3 => 8.9 => 8.7 => 8.4, INR 2.11 => 2.4  PMH: 1. CAD: s/p CABG in 2010.  2. Aortic stenosis: s/p TAVR in 2015. Valve looked ok on 2/18 echo.  3. Atrial fibrillation: Chronic.  4. Type II diabetes.  5. CKD: Stage III, cardiorenal syndrome.  6. Chronic systolic CHF: Ischemic cardiomyopathy with prominent RV dysfunction.  Medtronic ICD.  - Echo (2/18): EF 20-25%, moderately dilated LV, moderately dilated/moderately dysfunctional RV, severe TR.  - Cardiogenic shock 2/18 requiring milrinone, norepinephrine, and IABP.  - Heartmate II LVAD for DT placed 12/13/16.   7. OSA.  8. Thrombocytopenia: Post-op LVAD, resolved.  9. Tricuspid regurgitation: Severe, s/p repair with LVAD placement in 2/18.   Social History   Social History  . Marital status: Widowed    Spouse name: N/A  . Number of children: 0  . Years of education: 14   Occupational History  . Not on file.   Social History Main Topics  . Smoking status: Never Smoker  . Smokeless tobacco: Never Used  . Alcohol use No  . Drug use: No  . Sexual activity: Not on file   Other Topics Concern  . Not on file   Social History Narrative   Patient is a  widow. Moved to [place] from [place], [place]. Currently lives with his sister Lavalle Skoda.    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    Family History  Problem Relation Age of Onset  . Heart failure Father   . Heart attack Father   . Healthy Mother   . Diabetes Paternal Grandfather    ROS: All systems reviewed and negative except as per HPI.   Current Outpatient Prescriptions  Medication Sig Dispense Refill  . albuterol (PROVENTIL HFA;VENTOLIN HFA) 108 (90 Base) MCG/ACT inhaler Inhale 2 puffs into the lungs every 4 (four) hours as needed for wheezing or shortness of breath. 1 Inhaler 5  . albuterol (PROVENTIL) (2.5 MG/3ML) 0.083% nebulizer solution Take 3 mLs (2.5 mg total) by nebulization every 4 (four) hours. And as needed 150 mL 5  . amLODipine (NORVASC) 2.5 MG tablet Take 1 tablet (2.5 mg total) by mouth daily. 30 tablet 6  . ascorbic acid (VITAMIN C) 1000 MG tablet Take 1,000 mg by mouth daily.    Marland Kitchen aspirin 325 MG  tablet Take 325 mg by mouth daily.    Marland Kitchen atorvastatin (LIPITOR) 40 MG tablet Take 1 tablet (40 mg total) by mouth daily. 30 tablet 6  . benzonatate (TESSALON) 100 MG capsule Take 1 capsule (100 mg total) by mouth 3 (three) times daily as needed for cough. 20 capsule 6  . busPIRone (BUSPAR) 5 MG tablet Take 1 tablet (5 mg total) by mouth 2 (two) times daily. 60 tablet 6  . DOK 100 MG capsule TAKE 2 CAPSULES(200 MG) BY MOUTH DAILY 30 capsule 3  . fluticasone (FLOVENT HFA) 110 MCG/ACT inhaler Inhale 2 puffs into the lungs 2 (two) times daily. 1 Inhaler 5  . gabapentin (NEURONTIN) 600 MG tablet Take 0.5 tablets (300 mg total) by mouth 2 (two) times daily. 60 tablet 6  . insulin glargine (LANTUS) 100 unit/mL SOPN Inject 0.2 mLs (20 Units total) into the skin at bedtime. 15 mL 0  . levothyroxine (SYNTHROID, LEVOTHROID) 25 MCG tablet Take 1 tablet (25 mcg total) by mouth daily before breakfast. 30 tablet 6  . Multiple Vitamin (MULTIVITAMIN) tablet Take 1 tablet by  mouth daily.    . pantoprazole (PROTONIX) 40 MG tablet Take 1 tablet (40 mg total) by mouth daily at 12 noon. 30 tablet 6  . sildenafil (REVATIO) 20 MG tablet Take 1 tablet (20 mg total) by mouth 3 (three) times daily. 90 tablet 6  . spironolactone (ALDACTONE) 25 MG tablet Take 0.5 tablets (12.5 mg total) by mouth daily. 30 tablet 6  . Tiotropium Bromide-Olodaterol (STIOLTO RESPIMAT) 2.5-2.5 MCG/ACT AERS Inhale 2 puffs into the lungs daily.    Marland Kitchen torsemide (DEMADEX) 20 MG tablet Take 3 tablets (60 mg total) by mouth daily. 90 tablet 6  . traMADol (ULTRAM) 50 MG tablet Take 1 tablet (50 mg total) by mouth every 6 (six) hours as needed for moderate pain. 30 tablet 0  . traZODone (DESYREL) 50 MG tablet TAKE 1 TABLET(50 MG) BY MOUTH AT BEDTIME AS NEEDED FOR SLEEP 30 tablet 3  . warfarin (COUMADIN) 5 MG tablet Take 1.5 tablets (7.5 mg total) by mouth daily. 45 tablet 6   No current facility-administered medications for this encounter.    BP (!) 88/0 Comment: doppler  Pulse 89   Ht 5\' 5"  (1.651 m)   Wt 186 lb 3.2 oz (84.5 kg)   SpO2 98%   BMI 30.99 kg/m  General: NAD Neck: JVP 7 cm, no thyromegaly or thyroid nodule.  Lungs: CTAB  CV: LVAD hum.  1+ edema to knees bilaterally.  No carotid bruit.   Abdomen: Soft, nontender, no hepatosplenomegaly, no distention.  Skin: Intact without lesions or rashes.  Neurologic: Alert and oriented x 3.  Psych: Normal affect. Extremities: No clubbing or cyanosis.  HEENT: Normal.   Assessment/Plan: 1. Chronic systolic CHF: Ischemic cardiomyopathy, EF 20-25% with RV dysfunction on 2/18 echo pre-LVAD.  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  Medtronic ICD.  He feels much better post-LVAD, NYHA class II. He still has peripheral edema but weight is down 3 lbs.  LVAD parameters reviewed today and stable.  LDH trending down since increasing ASA to 325 mg daily.  INR has been therapeutic.  No tea-colored urine, no power spikes.  MAP improved on amlodipine and spironolactone.  -  Continue torsemide 60 mg daily.   - Continue warfarin + ASA 325 mg daily.  - He will be starting cardiac rehab.     2. CAD: s/p CABG.  No chest pain.  He is on ASA and atorvastatin.  3. CKD: Stage III.  Creatinine today is at his baseline.  He is seeing Dr. Hollie Salk for nephrology.  4. Atrial fibrillation: Chronic.  On warfarin.  5. 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  6. HTN: MAP 85 today.  Continue amlodipine and spironolactone.  7. Anemia: Stable, anemia of renal disease/chronic disease.  Nephrology following, will likely need erythropoietin.     Followup in 2 wks.   Loralie Champagne 01/30/2017

## 2017-02-06 ENCOUNTER — Telehealth (HOSPITAL_COMMUNITY): Payer: Self-pay | Admitting: Pharmacist

## 2017-02-06 ENCOUNTER — Telehealth: Payer: Self-pay | Admitting: Emergency Medicine

## 2017-02-06 NOTE — Telephone Encounter (Signed)
Sildenafil 20 mg TID appeal approved by [employer] through 08/03/17.   Ruta Hinds. Velva Harman, PharmD, BCPS, CPP Clinical Pharmacist Pager: [phone] Phone: [phone] 02/06/2017 12:30 PM

## 2017-02-06 NOTE — Telephone Encounter (Signed)
Received a form from [employer] asking for progress notes and medication records for albuterol neb solution. Medication records and OV notes from 11/27/16 were printed and faxed to [facility]. Nothing else needed.

## 2017-02-10 ENCOUNTER — Encounter (HOSPITAL_COMMUNITY): Payer: Self-pay

## 2017-02-10 ENCOUNTER — Ambulatory Visit (HOSPITAL_BASED_OUTPATIENT_CLINIC_OR_DEPARTMENT_OTHER)
Admission: RE | Admit: 2017-02-10 | Discharge: 2017-02-10 | Disposition: A | Discharge status: Home / Self Care | Payer: Medicare Other | Source: Ambulatory Visit | Attending: Cardiology | Admitting: Cardiology

## 2017-02-10 ENCOUNTER — Inpatient Hospital Stay (HOSPITAL_COMMUNITY)
Admission: AD | Admit: 2017-02-10 | Discharge: 2017-02-20 | DRG: 377 | Disposition: A | Discharge status: Home / Self Care | Payer: Medicare Other | Source: Ambulatory Visit | Attending: Cardiology | Admitting: Cardiology

## 2017-02-10 ENCOUNTER — Telehealth (HOSPITAL_COMMUNITY): Payer: Self-pay | Admitting: *Deleted

## 2017-02-10 ENCOUNTER — Ambulatory Visit (HOSPITAL_COMMUNITY): Payer: Self-pay | Admitting: Pharmacist

## 2017-02-10 VITALS — BP 76/0 | HR 82 | Ht 65.0 in | Wt 189.0 lb

## 2017-02-10 DIAGNOSIS — L03116 Cellulitis of left lower limb: Secondary | ICD-10-CM | POA: Diagnosis present

## 2017-02-10 DIAGNOSIS — I2510 Atherosclerotic heart disease of native coronary artery without angina pectoris: Secondary | ICD-10-CM | POA: Insufficient documentation

## 2017-02-10 DIAGNOSIS — I5043 Acute on chronic combined systolic (congestive) and diastolic (congestive) heart failure: Secondary | ICD-10-CM | POA: Diagnosis not present

## 2017-02-10 DIAGNOSIS — I509 Heart failure, unspecified: Secondary | ICD-10-CM

## 2017-02-10 DIAGNOSIS — D6959 Other secondary thrombocytopenia: Secondary | ICD-10-CM | POA: Diagnosis present

## 2017-02-10 DIAGNOSIS — E039 Hypothyroidism, unspecified: Secondary | ICD-10-CM | POA: Diagnosis present

## 2017-02-10 DIAGNOSIS — Z7901 Long term (current) use of anticoagulants: Secondary | ICD-10-CM

## 2017-02-10 DIAGNOSIS — Z951 Presence of aortocoronary bypass graft: Secondary | ICD-10-CM

## 2017-02-10 DIAGNOSIS — I50810 Right heart failure, unspecified: Secondary | ICD-10-CM

## 2017-02-10 DIAGNOSIS — I130 Hypertensive heart and chronic kidney disease with heart failure and stage 1 through stage 4 chronic kidney disease, or unspecified chronic kidney disease: Secondary | ICD-10-CM | POA: Diagnosis present

## 2017-02-10 DIAGNOSIS — Z95811 Presence of heart assist device: Secondary | ICD-10-CM | POA: Diagnosis not present

## 2017-02-10 DIAGNOSIS — K5730 Diverticulosis of large intestine without perforation or abscess without bleeding: Secondary | ICD-10-CM | POA: Diagnosis present

## 2017-02-10 DIAGNOSIS — N183 Chronic kidney disease, stage 3 unspecified: Secondary | ICD-10-CM

## 2017-02-10 DIAGNOSIS — Z7982 Long term (current) use of aspirin: Secondary | ICD-10-CM | POA: Insufficient documentation

## 2017-02-10 DIAGNOSIS — D500 Iron deficiency anemia secondary to blood loss (chronic): Secondary | ICD-10-CM | POA: Diagnosis present

## 2017-02-10 DIAGNOSIS — E1122 Type 2 diabetes mellitus with diabetic chronic kidney disease: Secondary | ICD-10-CM | POA: Diagnosis present

## 2017-02-10 DIAGNOSIS — Z794 Long term (current) use of insulin: Secondary | ICD-10-CM

## 2017-02-10 DIAGNOSIS — D6489 Other specified anemias: Secondary | ICD-10-CM

## 2017-02-10 DIAGNOSIS — I252 Old myocardial infarction: Secondary | ICD-10-CM

## 2017-02-10 DIAGNOSIS — K921 Melena: Secondary | ICD-10-CM | POA: Diagnosis not present

## 2017-02-10 DIAGNOSIS — D649 Anemia, unspecified: Secondary | ICD-10-CM | POA: Diagnosis present

## 2017-02-10 DIAGNOSIS — K922 Gastrointestinal hemorrhage, unspecified: Secondary | ICD-10-CM | POA: Diagnosis present

## 2017-02-10 DIAGNOSIS — Z952 Presence of prosthetic heart valve: Secondary | ICD-10-CM

## 2017-02-10 DIAGNOSIS — G4733 Obstructive sleep apnea (adult) (pediatric): Secondary | ICD-10-CM | POA: Diagnosis present

## 2017-02-10 DIAGNOSIS — Z79899 Other long term (current) drug therapy: Secondary | ICD-10-CM

## 2017-02-10 DIAGNOSIS — I5022 Chronic systolic (congestive) heart failure: Secondary | ICD-10-CM | POA: Insufficient documentation

## 2017-02-10 DIAGNOSIS — I255 Ischemic cardiomyopathy: Secondary | ICD-10-CM | POA: Insufficient documentation

## 2017-02-10 DIAGNOSIS — K5521 Angiodysplasia of colon with hemorrhage: Secondary | ICD-10-CM | POA: Diagnosis present

## 2017-02-10 DIAGNOSIS — J45909 Unspecified asthma, uncomplicated: Secondary | ICD-10-CM | POA: Diagnosis present

## 2017-02-10 DIAGNOSIS — I5023 Acute on chronic systolic (congestive) heart failure: Secondary | ICD-10-CM | POA: Diagnosis present

## 2017-02-10 DIAGNOSIS — D125 Benign neoplasm of sigmoid colon: Secondary | ICD-10-CM | POA: Diagnosis present

## 2017-02-10 DIAGNOSIS — Q2733 Arteriovenous malformation of digestive system vessel: Secondary | ICD-10-CM | POA: Diagnosis not present

## 2017-02-10 DIAGNOSIS — I482 Chronic atrial fibrillation: Secondary | ICD-10-CM | POA: Insufficient documentation

## 2017-02-10 DIAGNOSIS — I480 Paroxysmal atrial fibrillation: Secondary | ICD-10-CM | POA: Insufficient documentation

## 2017-02-10 DIAGNOSIS — E876 Hypokalemia: Secondary | ICD-10-CM | POA: Diagnosis not present

## 2017-02-10 DIAGNOSIS — J449 Chronic obstructive pulmonary disease, unspecified: Secondary | ICD-10-CM | POA: Diagnosis present

## 2017-02-10 HISTORY — DX: Presence of cardiac pacemaker: Z95.0

## 2017-02-10 HISTORY — DX: Presence of automatic (implantable) cardiac defibrillator: Z95.810

## 2017-02-10 LAB — CBC
HEMATOCRIT: 20.1 % — AB (ref 39.0–52.0)
Hemoglobin: 6.5 g/dL — CL (ref 13.0–17.0)
MCH: 31.6 pg (ref 26.0–34.0)
MCHC: 32.3 g/dL (ref 30.0–36.0)
MCV: 97.6 fL (ref 78.0–100.0)
PLATELETS: 132 10*3/uL — AB (ref 150–400)
RBC: 2.06 MIL/uL — AB (ref 4.22–5.81)
RDW: 20.1 % — ABNORMAL HIGH (ref 11.5–15.5)
WBC: 10.2 10*3/uL (ref 4.0–10.5)

## 2017-02-10 LAB — BASIC METABOLIC PANEL
Anion gap: 11 (ref 5–15)
BUN: 61 mg/dL — ABNORMAL HIGH (ref 6–20)
CHLORIDE: 97 mmol/L — AB (ref 101–111)
CO2: 29 mmol/L (ref 22–32)
CREATININE: 1.93 mg/dL — AB (ref 0.61–1.24)
Calcium: 9 mg/dL (ref 8.9–10.3)
GFR calc non Af Amer: 33 mL/min — ABNORMAL LOW (ref >60)
GFR, EST AFRICAN AMERICAN: 39 mL/min — AB (ref >60)
Glucose, Bld: 152 mg/dL — ABNORMAL HIGH (ref 65–99)
Potassium: 3.7 mmol/L (ref 3.5–5.1)
SODIUM: 137 mmol/L (ref 135–145)

## 2017-02-10 LAB — GLUCOSE, CAPILLARY
Glucose-Capillary: 131 mg/dL — ABNORMAL HIGH (ref 65–99)
Glucose-Capillary: 214 mg/dL — ABNORMAL HIGH (ref 65–99)

## 2017-02-10 LAB — PROTIME-INR
INR: 2.51
Prothrombin Time: 27.5 seconds — ABNORMAL HIGH (ref 11.4–15.2)

## 2017-02-10 LAB — LACTATE DEHYDROGENASE: LDH: 270 U/L — AB (ref 98–192)

## 2017-02-10 LAB — PREPARE RBC (CROSSMATCH)

## 2017-02-10 MED ORDER — DOCUSATE SODIUM 100 MG PO CAPS
200.0000 mg | ORAL_CAPSULE | Freq: Every day | ORAL | Status: DC
  Administered 2017-02-15 – 2017-02-20 (×6): 200 mg via ORAL
  Filled 2017-02-10 (×6): qty 2

## 2017-02-10 MED ORDER — PANTOPRAZOLE SODIUM 40 MG PO TBEC
40.0000 mg | DELAYED_RELEASE_TABLET | Freq: Every day | ORAL | Status: DC
  Administered 2017-02-11 – 2017-02-20 (×10): 40 mg via ORAL
  Filled 2017-02-10 (×11): qty 1

## 2017-02-10 MED ORDER — SODIUM CHLORIDE 0.9 % IV SOLN
250.0000 mL | INTRAVENOUS | Status: DC | PRN
  Administered 2017-02-14: 09:00:00 via INTRAVENOUS

## 2017-02-10 MED ORDER — ONDANSETRON HCL 4 MG/2ML IJ SOLN: 4.0000 mg | Freq: Four times a day (QID) | INTRAMUSCULAR | Status: DC | PRN

## 2017-02-10 MED ORDER — TRAZODONE HCL 50 MG PO TABS
50.0000 mg | ORAL_TABLET | Freq: Every evening | ORAL | Status: DC | PRN
  Administered 2017-02-10 – 2017-02-18 (×6): 50 mg via ORAL
  Filled 2017-02-10 (×6): qty 1

## 2017-02-10 MED ORDER — BUSPIRONE HCL 5 MG PO TABS
5.0000 mg | ORAL_TABLET | Freq: Two times a day (BID) | ORAL | Status: DC
  Administered 2017-02-10 – 2017-02-20 (×19): 5 mg via ORAL
  Filled 2017-02-10 (×20): qty 1

## 2017-02-10 MED ORDER — ALBUTEROL SULFATE (2.5 MG/3ML) 0.083% IN NEBU
2.5000 mg | INHALATION_SOLUTION | Freq: Three times a day (TID) | RESPIRATORY_TRACT | Status: DC
  Administered 2017-02-11 (×2): 2.5 mg via RESPIRATORY_TRACT
  Filled 2017-02-10 (×2): qty 3

## 2017-02-10 MED ORDER — FUROSEMIDE 10 MG/ML IJ SOLN
80.0000 mg | Freq: Once | INTRAMUSCULAR | Status: AC
  Administered 2017-02-11: 80 mg via INTRAVENOUS
  Filled 2017-02-10: qty 8

## 2017-02-10 MED ORDER — ACETAMINOPHEN 325 MG PO TABS
650.0000 mg | ORAL_TABLET | ORAL | Status: DC | PRN
  Administered 2017-02-12 – 2017-02-13 (×2): 650 mg via ORAL
  Filled 2017-02-10 (×2): qty 2

## 2017-02-10 MED ORDER — INSULIN ASPART 100 UNIT/ML ~~LOC~~ SOLN
0.0000 [IU] | Freq: Three times a day (TID) | SUBCUTANEOUS | Status: DC
  Administered 2017-02-10: 2 [IU] via SUBCUTANEOUS
  Administered 2017-02-11: 11 [IU] via SUBCUTANEOUS
  Administered 2017-02-11 – 2017-02-12 (×2): 5 [IU] via SUBCUTANEOUS
  Administered 2017-02-13: 2 [IU] via SUBCUTANEOUS
  Administered 2017-02-13 – 2017-02-14 (×2): 3 [IU] via SUBCUTANEOUS
  Administered 2017-02-14: 5 [IU] via SUBCUTANEOUS
  Administered 2017-02-15: 3 [IU] via SUBCUTANEOUS
  Administered 2017-02-15 (×2): 2 [IU] via SUBCUTANEOUS
  Administered 2017-02-16: 3 [IU] via SUBCUTANEOUS
  Administered 2017-02-16: 2 [IU] via SUBCUTANEOUS
  Administered 2017-02-16 – 2017-02-17 (×2): 3 [IU] via SUBCUTANEOUS
  Administered 2017-02-17: 2 [IU] via SUBCUTANEOUS
  Administered 2017-02-17: 5 [IU] via SUBCUTANEOUS
  Administered 2017-02-18 (×2): 3 [IU] via SUBCUTANEOUS
  Administered 2017-02-18: 2 [IU] via SUBCUTANEOUS
  Administered 2017-02-19: 5 [IU] via SUBCUTANEOUS
  Administered 2017-02-19: 3 [IU] via SUBCUTANEOUS
  Administered 2017-02-19: 2 [IU] via SUBCUTANEOUS
  Administered 2017-02-20: 3 [IU] via SUBCUTANEOUS
  Administered 2017-02-20: 2 [IU] via SUBCUTANEOUS

## 2017-02-10 MED ORDER — SODIUM CHLORIDE 0.9% FLUSH: 3.0000 mL | INTRAVENOUS | Status: DC | PRN

## 2017-02-10 MED ORDER — SODIUM CHLORIDE 0.9% FLUSH
3.0000 mL | Freq: Two times a day (BID) | INTRAVENOUS | Status: DC
  Administered 2017-02-10 – 2017-02-17 (×7): 3 mL via INTRAVENOUS

## 2017-02-10 MED ORDER — ALBUTEROL SULFATE (2.5 MG/3ML) 0.083% IN NEBU
2.5000 mg | INHALATION_SOLUTION | RESPIRATORY_TRACT | Status: DC
  Administered 2017-02-10: 2.5 mg via RESPIRATORY_TRACT
  Filled 2017-02-10: qty 3

## 2017-02-10 MED ORDER — SODIUM CHLORIDE 0.9 % IV SOLN
Freq: Once | INTRAVENOUS | Status: AC
  Administered 2017-02-10: 21:00:00 via INTRAVENOUS

## 2017-02-10 MED ORDER — ADULT MULTIVITAMIN W/MINERALS CH
1.0000 | ORAL_TABLET | Freq: Every day | ORAL | Status: DC
  Administered 2017-02-11 – 2017-02-20 (×9): 1 via ORAL
  Filled 2017-02-10 (×10): qty 1

## 2017-02-10 MED ORDER — GABAPENTIN 300 MG PO CAPS
300.0000 mg | ORAL_CAPSULE | Freq: Two times a day (BID) | ORAL | Status: DC
  Administered 2017-02-10 – 2017-02-20 (×19): 300 mg via ORAL
  Filled 2017-02-10 (×19): qty 1

## 2017-02-10 MED ORDER — ALBUTEROL SULFATE (2.5 MG/3ML) 0.083% IN NEBU
2.5000 mg | INHALATION_SOLUTION | RESPIRATORY_TRACT | Status: DC | PRN
  Administered 2017-02-13 – 2017-02-14 (×2): 2.5 mg via RESPIRATORY_TRACT
  Filled 2017-02-10 (×2): qty 3

## 2017-02-10 MED ORDER — SILDENAFIL CITRATE 20 MG PO TABS
20.0000 mg | ORAL_TABLET | Freq: Three times a day (TID) | ORAL | Status: DC
  Administered 2017-02-10 – 2017-02-20 (×28): 20 mg via ORAL
  Filled 2017-02-10 (×30): qty 1

## 2017-02-10 MED ORDER — ALBUTEROL SULFATE (2.5 MG/3ML) 0.083% IN NEBU: 2.5000 mg | INHALATION_SOLUTION | RESPIRATORY_TRACT | Status: DC

## 2017-02-10 MED ORDER — LEVOTHYROXINE SODIUM 25 MCG PO TABS
25.0000 ug | ORAL_TABLET | Freq: Every day | ORAL | Status: DC
  Administered 2017-02-11 – 2017-02-20 (×10): 25 ug via ORAL
  Filled 2017-02-10 (×10): qty 1

## 2017-02-10 MED ORDER — ATORVASTATIN CALCIUM 40 MG PO TABS
40.0000 mg | ORAL_TABLET | Freq: Every day | ORAL | Status: DC
  Administered 2017-02-11 – 2017-02-20 (×9): 40 mg via ORAL
  Filled 2017-02-10 (×10): qty 1

## 2017-02-10 NOTE — Consult Note (Signed)
[facility] Consult: 3:20 PM 02/10/2017  LOS: 0 days    Referring Provider: Dr Aundra Dubin  Primary Care Physician:  No PCP Per Patient Primary Gastroenterologist:  none     Reason for Consultation:  GIB and anemia.    HPI: Trevor [name] is a 71 y.o. male.  PMH ischemic CM, CHF, EF 5 to 10% by echo 12/20/16.    Several admissions for CHF to hospitals in [place] before relocating to [place] in early 2018.  MI and s/p CABG in [place] ~ 2014.  s/p ICDs x 2.  A fib, chronic Coumadin. Admission with CHF, required intraaortic balloon pump prior to 12/13/2016 redo sternotomy, LVAD (destination), and tricuspid ring annuloplasty .  Stage 3 CKD.  Hx cardiorenal syndrome OSA.  COPD.  Hypothyroidism.  IDDM, type 2.  Remote laparotomy age 47 for "twisted intestines".   Last colonoscopy was ~ 10 years ago.  On CT ab/pelvis (non contrast) 12/03/16 has small volume ascites, normal liver, sigmoid tics   Seen in heart failure clinic today with 5 days of fatigue, DOE and black, formed stools 4 to 5 per day.  His norm is to have 1 or 2 formed stools per day. Hgb is 6.5.  Was 8.4 on 01/30/17 and average of ~8 to 9 in February.  INR is 2.5.  BUN/creat 61/1.9 which is at higher end of historic range in recent months.  Though was as a low as 6.8 on 12/18/16.    After the LVAD surgery, he was switched from Eliquis to Coumadin. 4 weeks ago, his cardiologist up to his dose of aspirin from 81 mg to 325 mg daily.  Takes Protonix daily.  Patient has good appetite. His weight has been stable within 5 pounds. No dysphagia. No heartburn, no nausea vomiting. His colonoscopy was done 10 or more years ago and it was a screening study which was totally normal. Never had upper endoscopy. The only time he's ever had transfusion was back in February when he  received a couple of units.  Generally, the LVAD has made a big difference in his life in terms of walking around and leading a normal life rather than being chronically short of breath.   Past Medical History:  Diagnosis Date  . Asthma   . CHF (congestive heart failure) ([place])   . Diabetes ([place])   . Kidney disease   . Sleep apnea     Past Surgical History:  Procedure Laterality Date  . CARDIAC CATHETERIZATION N/A 12/02/2016   Procedure: Right Heart Cath;  Surgeon: Larey Dresser, MD;  Location: [facility];  Service: Cardiovascular;  Laterality: N/A;  . IABP INSERTION N/A 12/11/2016   Procedure: IABP Insertion;  Surgeon: Larey Dresser, MD;  Location: [facility];  Service: Cardiovascular;  Laterality: N/A;  . INSERTION OF IMPLANTABLE LEFT VENTRICULAR ASSIST DEVICE N/A 12/13/2016   Procedure: INSERTION OF IMPLANTABLE LEFT VENTRICULAR ASSIST DEVICE;  Surgeon: Ivin Poot, MD;  Location: [facility];  Service: Open Heart Surgery;  Laterality: N/A;  HEARTMATE II  NITRIC  OXIDE  . RIGHT HEART CATH N/A 12/11/2016   Procedure: Right Heart Cath;  Surgeon: Larey Dresser, MD;  Location: [facility];  Service: Cardiovascular;  Laterality: N/A;  . TEE WITHOUT CARDIOVERSION N/A 12/13/2016   Procedure: TRANSESOPHAGEAL ECHOCARDIOGRAM (TEE);  Surgeon: Ivin Poot, MD;  Location: [facility];  Service: Open Heart Surgery;  Laterality: N/A;  . TRICUSPID VALVE REPLACEMENT N/A 12/13/2016   Procedure: TRICUSPID VALVE REPAIR WITH EDWARDS MC 3 TRICUSPID ANNULOPLASTY RING MODEL 4900 SIZE T 28;  Surgeon: Ivin Poot, MD;  Location: [facility];  Service: Open Heart Surgery;  Laterality: N/A;    Prior to Admission medications   Medication Sig Start Date End Date Taking? Authorizing Provider  albuterol (PROVENTIL HFA;VENTOLIN HFA) 108 (90 Base) MCG/ACT inhaler Inhale 2 puffs into the lungs every 4 (four) hours as needed for wheezing or shortness of breath. 11/27/16   [name], MD  albuterol  (PROVENTIL) (2.5 MG/3ML) 0.083% nebulizer solution Take 3 mLs (2.5 mg total) by nebulization every 4 (four) hours. And as needed 11/27/16   [name], MD  amLODipine (NORVASC) 2.5 MG tablet Take 1 tablet (2.5 mg total) by mouth daily. 01/16/17 02/15/17  Larey Dresser, MD  ascorbic acid (VITAMIN C) 1000 MG tablet Take 1,000 mg by mouth daily.    Historical Provider, MD  aspirin 325 MG tablet Take 325 mg by mouth daily.    Historical Provider, MD  atorvastatin (LIPITOR) 40 MG tablet Take 1 tablet (40 mg total) by mouth daily. 01/01/17   Amy D Ninfa Meeker, NP  benzonatate (TESSALON) 100 MG capsule Take 1 capsule (100 mg total) by mouth 3 (three) times daily as needed for cough. 01/23/17   Larey Dresser, MD  busPIRone (BUSPAR) 5 MG tablet Take 1 tablet (5 mg total) by mouth 2 (two) times daily. 01/01/17   Amy D Clegg, NP  DOK 100 MG capsule TAKE 2 CAPSULES(200 MG) BY MOUTH DAILY 01/22/17   Amy D Clegg, NP  fluticasone (FLOVENT HFA) 110 MCG/ACT inhaler Inhale 2 puffs into the lungs 2 (two) times daily. 11/27/16   [name], MD  gabapentin (NEURONTIN) 600 MG tablet Take 0.5 tablets (300 mg total) by mouth 2 (two) times daily. 01/01/17   Amy D Clegg, NP  insulin glargine (LANTUS) 100 unit/mL SOPN Inject 0.2 mLs (20 Units total) into the skin at bedtime. 01/10/17   Larey Dresser, MD  levothyroxine (SYNTHROID, LEVOTHROID) 25 MCG tablet Take 1 tablet (25 mcg total) by mouth daily before breakfast. 01/01/17   Amy D Ninfa Meeker, NP  Multiple Vitamin (MULTIVITAMIN) tablet Take 1 tablet by mouth daily.    Historical Provider, MD  pantoprazole (PROTONIX) 40 MG tablet Take 1 tablet (40 mg total) by mouth daily at 12 noon. 01/01/17   Amy D Ninfa Meeker, NP  sildenafil (REVATIO) 20 MG tablet Take 1 tablet (20 mg total) by mouth 3 (three) times daily. 01/01/17   Amy D Ninfa Meeker, NP  spironolactone (ALDACTONE) 25 MG tablet Take 0.5 tablets (12.5 mg total) by mouth daily. 01/16/17   Larey Dresser, MD  Tiotropium Bromide-Olodaterol (STIOLTO  RESPIMAT) 2.5-2.5 MCG/ACT AERS Inhale 2 puffs into the lungs daily.    Historical Provider, MD  torsemide (DEMADEX) 20 MG tablet Take 3 tablets (60 mg total) by mouth daily. 01/01/17   Amy D Ninfa Meeker, NP  traMADol (ULTRAM) 50 MG tablet Take 1 tablet (50 mg total) by mouth every 6 (six) hours as needed for moderate pain. 01/01/17  Amy D Clegg, NP  traZODone (DESYREL) 50 MG tablet TAKE 1 TABLET(50 MG) BY MOUTH AT BEDTIME AS NEEDED FOR SLEEP 01/22/17   Amy D Clegg, NP  warfarin (COUMADIN) 5 MG tablet Take 1.5 tablets (7.5 mg total) by mouth daily. 01/01/17   Conrad [place], NP    Scheduled Meds:  Infusions:  PRN Meds:    Allergies as of 02/10/2017 - Review Complete 01/17/2017  Allergen Reaction Noted  . No known allergies  12/12/2016    Family History  Problem Relation Age of Onset  . Heart failure Father   . Heart attack Father   . Healthy Mother   . Diabetes Paternal Grandfather     Social History   Social History  . Marital status: Widowed    Spouse name: N/A  . Number of children: 0  . Years of education: 14   Occupational History  . Not on file.   Social History Main Topics  . Smoking status: Never Smoker  . Smokeless tobacco: Never Used  . Alcohol use No  . Drug use: No  . Sexual activity: Not on file   Other Topics Concern  . Not on file   Social History Narrative   Patient is a widow. Moved to [place] from [place], [place]. Currently lives with his sister Grover Robinson.    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     REVIEW OF SYSTEMS: Constitutional:  Per history of present illness. ENT:  A few days after his VATS was placed, he did have nosebleeds but these resolved and have not recurred. Pulm:  Dyspnea. No cough. CV:  No palpitations, no LE edema.  GU:  No hematuria, no frequency GI:  Per history of present illness. Heme:  He bruises easily and will lose blood if he causes any cognitive disruption to the skin but hasn't had any aggressive  hemorrhaging   Transfusions:  Per HPI Neuro:  No headaches, no peripheral tingling or numbness Derm:  No itching, no rash or sores.  Endocrine:  No sweats or chills.  No polyuria or dysuria.  Blood sugars range between the 120s and 140s Immunization:  Not queried Travel:  None beyond local counties in last few months.    PHYSICAL EXAM: Vital signs in last 24 hours: There were no vitals filed for this visit. Wt Readings from Last 3 Encounters:  02/10/17 85.7 kg (189 lb)  01/30/17 84.5 kg (186 lb 3.2 oz)  01/17/17 83.5 kg (184 lb)    General: Pleasant, verbal, white male looks his stated age. Head:  No facial edema. No asymmetry. No signs of head trauma.  Eyes:  Scleral icterus or conjunctival pallor. Ears:  Hearing intact without obvious deficits  Nose:  No congestion or discharge Mouth:  Good dentition. Tongue midline. Oral mucosa moist, pink and clear. Neck:  JVD, no masses, no thyromegaly. Lungs:  Clear bilaterally. No labored breathing or cough.  Not obese scar is well-healed Heart: Had home.. Audible beneath the home is an irregular but not tachycardic Abdomen:  Soft. Not tender or distended. VAD drive line positioned on the right mid abdomen. There are a few scabbed over lesions.  Rectal: Deferred rectal exam.   Musc/Skeltl: Some arthritic changes in the fingers/hands Extremities:  CCE.  Neurologic:  Alert. Oriented times 3. No limb weakness. No tremors. Skin:  No suspicious lesions. No open sores. No rashes. Tattoos:  None observed Nodes:  Cervical adenopathy.   Psych:  Wasn't, in good spirits.  Intake/Output from previous day: No intake/output data recorded. Intake/Output this shift: No intake/output data recorded.  LAB RESULTS:  Recent Labs  02/10/17 1416  WBC 10.2  HGB 6.5*  HCT 20.1*  PLT 132*   BMET Lab Results  Component Value Date   NA 137 02/10/2017   NA 136 01/30/2017   NA 137 01/23/2017   K 3.7 02/10/2017   K 3.5 01/30/2017   K 3.5  01/23/2017   CL 97 (L) 02/10/2017   CL 94 (L) 01/30/2017   CL 96 (L) 01/23/2017   CO2 29 02/10/2017   CO2 30 01/30/2017   CO2 29 01/23/2017   GLUCOSE 152 (H) 02/10/2017   GLUCOSE 151 (H) 01/30/2017   GLUCOSE 209 (H) 01/23/2017   BUN 61 (H) 02/10/2017   BUN 46 (H) 01/30/2017   BUN 48 (H) 01/23/2017   CREATININE 1.93 (H) 02/10/2017   CREATININE 1.79 (H) 01/30/2017   CREATININE 1.81 (H) 01/23/2017   CALCIUM 9.0 02/10/2017   CALCIUM 8.8 (L) 01/30/2017   CALCIUM 9.1 01/23/2017   LFT No results for input(s): PROT, ALBUMIN, AST, ALT, ALKPHOS, BILITOT, BILIDIR, IBILI in the last 72 hours. PT/INR Lab Results  Component Value Date   INR 2.51 02/10/2017   INR 2.56 01/30/2017   INR 2.38 01/23/2017   Hepatitis Panel No results for input(s): HEPBSAG, HCVAB, HEPAIGM, HEPBIGM in the last 72 hours. C-Diff No components found for: CDIFF Lipase  No results found for: LIPASE  Drugs of Abuse     Component Value Date/Time   LABOPIA NONE DETECTED 12/03/2016 1720   COCAINSCRNUR NONE DETECTED 12/03/2016 1720   LABBENZ NONE DETECTED 12/03/2016 1720   AMPHETMU NONE DETECTED 12/03/2016 1720   THCU NONE DETECTED 12/03/2016 1720   LABBARB NONE DETECTED 12/03/2016 1720     RADIOLOGY STUDIES: No results found.   IMPRESSION:   *  GI bleed.  Likely upper.  Suspect AVMs   *  Blood loss anemia on top of chronic anemia.   *  Noncritical rhombus cytopenia. This was present intermittently during his February admission as well.  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    *  Chronic Coumadin.  INR therapeutic  *  Stage 3 CKD.    *  IDDM.  Good control.     PLAN:     *  Transfuse 2 units as planned.  Hgb in AM.  Ok to eat HH diet as ordered.  Continue his once daily Protonix. .    *  EGD once INR drifts to ~ 1.5.     Azucena Freed  02/10/2017, 3:20 PM Pager: [phone]  ________________________________________________________________________  Rudolpho Sevin MD note:  I personally  examined the patient, reviewed the data and agree with the assessment and plan described above.  Awaiting INR, CBC this morning after holding coumadin yesterday and getting 2 units PRBC overnight.  No melena in about 24 hours now.  He has not had colonoscopy in >10 years.  I recommended that we do colonoscopy and EGD/Enteroscopy when INR is 1.5 or lower.  My guess is that will be on Thursday.     Owens Loffler, MD [facility] Gastroenterology Pager [phone]

## 2017-02-10 NOTE — Progress Notes (Signed)
Patient presents for sick visit in [facility] today. Pt reports that he has been having 5-6  black, tarry stools daily. Pt reports that he has been feeling weak and more SOB the last 5 days. Reports no problems with VAD equipment or concerns with drive line.  Vital Signs:  Doppler Pressure: 76 Automatc BP: 103/39 (63) HR: 82 SPO2:97  %  Height:5'5" Weight: 189 lb w/o eqt Last weight: 186.2 lb Home weights: 185-188 lbs w/o eqt  VAD Indication: Destination Therapy    VAD interrogation & Equipment Management (Reviewed with [name]): Speed:9000 Flow: 5.8 Power:5.5 w    PI:5.5  Alarms: no clinical alarms Events: rare   Fixed speed 9000 Low speed limit: 8400  Primary Controller:  Replace back up battery in 20 months. Back up controller:   Replace back up battery in 20 months.  I reviewed the LVAD parameters from today and compared the results to the patient's prior recorded data. LVAD interrogation was NEGATIVE for significant power changes, NEGATIVE for clinical alarms and STABLE for PI events/speed drops. No programming changes were made and pump is functioning within specified parameters. Pt is performing daily controller and system monitor self tests along with completing weekly and monthly maintenance for LVAD equipment.  LVAD equipment check completed and is in good working order. Back-up equipment present. Charged back up battery and performed self-test on equipment.   Exit Site Care: Drive line is being maintained weekly by sisters. Drive line exit site well healed and incorporated The velour is fully implanted at exit site. Dressing dry and intact.  VAD dressing removed and site care performed using sterile technique. Drive line exit site cleaned with Chlora prep applicators x 2, allowed to dry, skin protectant applied and allowed to dry before Sorbaview dressing and aquaseal re-applied. Exit site well healed and incorporated.The velour is fully implanted at exit  site. Slight redness,no tenderness, small amount of brownish yellow crust around driveline, no foul odor noted. Pt denies fever or chills. Driveline dressing is being changed daily per sterile technique. Pt denies fever or chills. Pt instructed to continue dressing changes to twice weekly.   Back-up equipment present. LVAD education done on emergency procedures and precautions and reviewed exit site care.   Significant Events on VAD Support:  12/13/16 VAD implant D/C on 2/28  Device: medtronic Therapies: on VT monitor on@150 , VF @ 230 Last check:    BP & Labs:  MAP-76 - Doppler   Hgb 6.5  LDH stable at 270.   Patient Instructions:  1. Continue dressing changes to twice weekly.  2. Admit for blood and GI consult.    [name], BSN VAD Coordinator   Office: [phone] 24/7 Emergency VAD Pager: [phone]

## 2017-02-10 NOTE — Telephone Encounter (Signed)
Paged by patient who states he is having 5-7 large bloody/black stools per day. States he does not feel dizzy or have any other symptoms. Appointment made for today at 1400 in HF clinic.  Balinda Quails RN, VAD Coordinator 24/7 pager [phone]

## 2017-02-10 NOTE — Progress Notes (Signed)
Pt arrived to 2w23 from HF clinic as a direct admit. Pt oriented to room. VAD emergency equipment to be delivered by sister. Pt currently on battery pack. Telemetry box applied.   Grant Fontana BSN, RN

## 2017-02-10 NOTE — H&P (Addendum)
HF LVAD/Cardiology: Dr. [name] a 71 y.o.malewith a history of CAD s/p CABG x 4 2010, OSA, AS with TAVR 2015, CKD, DM2, paroxysmal atrial fibrillation, asthma, and ischemic cardiomyopathy with Medtronic ICD.   Relocated to [place] from [place] in early 2018. Over the last couple of years, he had been admitted multiple times for CHF. Last admission in [place] was in 1/18. Sounds like the hospitalization was complicated by cardiorenal syndrome and there was consideration of doing dialysis.  He was admitted in to [facility] 11/27/16 with NYHA class IV symptoms and hypotension. He was placed on dual inotropes to facilitate diuresis, cardiac output and renal function. CT surgery consulted for mechanical support and he was deemed appropriate for LVAD work up. He completed LVAD work up and was approved for HMII LVAD -->DT. On 12/11/16, he had IABP placed to optimize cardiac output and improve renal function. He then underwent HMII LVAD placement + tricuspid valve repair on 12/14/2015. Post operatively pressors weaned off slowly. He required significant diuresis.  Course was complicated by RV dysfunction and renal dysfunction.  Today he returns for LVAD follow up. Complaining of fatigue. Mild dyspnea with exertion. 5 days he started having 4-5 bms a day. No brbpr but reports black bowel movements. Has not been seen by GI in 10 years (last seen [place]) .Weight at home 184-185 pounds.No fever or chills. No syncope/presyncope. Able to walk without a walker. Appetite ok.  Taking all medications.   .  PMH: 1. CAD: s/p CABG in 2010.  2. Aortic stenosis: s/p TAVR in 2015. Valve looked ok on 2/18 echo.  3. Atrial fibrillation: Chronic.  4. Type II diabetes.  5. CKD: Stage III, cardiorenal syndrome.  6. Chronic systolic CHF: Ischemic cardiomyopathy with prominent RV dysfunction.  Medtronic ICD.  - Echo (2/18): EF 20-25%, moderately dilated LV, moderately dilated/moderately  dysfunctional RV, severe TR.  - Cardiogenic shock 2/18 requiring milrinone, norepinephrine, and IABP.  - Heartmate II LVAD for DT placed 12/13/16.   7. OSA.  8. Thrombocytopenia: Post-op LVAD, resolved.  9. Tricuspid regurgitation: Severe, s/p repair with LVAD placement in 2/18.   Social History        Social History  . Marital status: Widowed    Spouse name: N/A  . Number of children: 0  . Years of education: 14   Occupational History  . Not on file.       Social History Main Topics  . Smoking status: Never Smoker  . Smokeless tobacco: Never Used  . Alcohol use No  . Drug use: No  . Sexual activity: Not on file       Other Topics Concern  . Not on file      Social History Narrative   Patient is a widow. Moved to [place] from [place], [place]. Currently lives with his sister Oseas Detty.    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         Family History  Problem Relation Age of Onset  . Heart failure Father   . Heart attack Father   . Healthy Mother   . Diabetes Paternal Grandfather    ROS: I reviewed all organ systems.  No significant findings other than those noted in the HPI.         Current Outpatient Prescriptions  Medication Sig Dispense Refill  . albuterol (PROVENTIL HFA;VENTOLIN HFA) 108 (90 Base) MCG/ACT inhaler Inhale 2 puffs into the lungs every 4 (four) hours  as needed for wheezing or shortness of breath. 1 Inhaler 5  . albuterol (PROVENTIL) (2.5 MG/3ML) 0.083% nebulizer solution Take 3 mLs (2.5 mg total) by nebulization every 4 (four) hours. And as needed 150 mL 5  . amLODipine (NORVASC) 2.5 MG tablet Take 1 tablet (2.5 mg total) by mouth daily. 30 tablet 6  . ascorbic acid (VITAMIN C) 1000 MG tablet Take 1,000 mg by mouth daily.    Marland Kitchen aspirin 325 MG tablet Take 325 mg by mouth daily.    Marland Kitchen atorvastatin (LIPITOR) 40 MG tablet Take 1 tablet (40 mg total) by mouth daily. 30 tablet 6  . benzonatate (TESSALON) 100 MG  capsule Take 1 capsule (100 mg total) by mouth 3 (three) times daily as needed for cough. 20 capsule 6  . busPIRone (BUSPAR) 5 MG tablet Take 1 tablet (5 mg total) by mouth 2 (two) times daily. 60 tablet 6  . DOK 100 MG capsule TAKE 2 CAPSULES(200 MG) BY MOUTH DAILY 30 capsule 3  . fluticasone (FLOVENT HFA) 110 MCG/ACT inhaler Inhale 2 puffs into the lungs 2 (two) times daily. 1 Inhaler 5  . gabapentin (NEURONTIN) 600 MG tablet Take 0.5 tablets (300 mg total) by mouth 2 (two) times daily. 60 tablet 6  . insulin glargine (LANTUS) 100 unit/mL SOPN Inject 0.2 mLs (20 Units total) into the skin at bedtime. 15 mL 0  . levothyroxine (SYNTHROID, LEVOTHROID) 25 MCG tablet Take 1 tablet (25 mcg total) by mouth daily before breakfast. 30 tablet 6  . Multiple Vitamin (MULTIVITAMIN) tablet Take 1 tablet by mouth daily.    . pantoprazole (PROTONIX) 40 MG tablet Take 1 tablet (40 mg total) by mouth daily at 12 noon. 30 tablet 6  . sildenafil (REVATIO) 20 MG tablet Take 1 tablet (20 mg total) by mouth 3 (three) times daily. 90 tablet 6  . spironolactone (ALDACTONE) 25 MG tablet Take 0.5 tablets (12.5 mg total) by mouth daily. 30 tablet 6  . Tiotropium Bromide-Olodaterol (STIOLTO RESPIMAT) 2.5-2.5 MCG/ACT AERS Inhale 2 puffs into the lungs daily.    Marland Kitchen torsemide (DEMADEX) 20 MG tablet Take 3 tablets (60 mg total) by mouth daily. 90 tablet 6  . traMADol (ULTRAM) 50 MG tablet Take 1 tablet (50 mg total) by mouth every 6 (six) hours as needed for moderate pain. 30 tablet 0  . traZODone (DESYREL) 50 MG tablet TAKE 1 TABLET(50 MG) BY MOUTH AT BEDTIME AS NEEDED FOR SLEEP 30 tablet 3  . warfarin (COUMADIN) 5 MG tablet Take 1.5 tablets (7.5 mg total) by mouth daily. 45 tablet 6   No current facility-administered medications for this encounter.    BP (!) 76/0 Comment: doppler map  Pulse 82   Ht 5\' 5"  (1.651 m)   Wt 189 lb (85.7 kg)   SpO2 97%   BMI 31.45 kg/m  General: NAD. Pale walked slowly in the  clinic Neck: JVP JVP 8-9 cm, no thyromegaly or thyroid nodule.  Lungs: CTAB on room air.  CV: LVAD hum.  R and LLE 1+ edema.   No carotid bruit.   Abdomen: Soft, NT, ND.   Skin: Intact without lesions or rashes. Drive line dressing intact with anchor in place.  Neurologic: Alert and oriented x 3.  Psych: Normal affect. Extremities: No clubbing or cyanosis. R and LLE 1+ edema. Warm.  HEENT: Normal.   Assessment/Plan: 1. Chronic systolic CHF: Ischemic cardiomyopathy, EF 20-25% with RV dysfunction on 2/18 echo pre-LVAD.  s/p Heartmate II LVAD 2/18 for DT.  Medtronic ICD.  NYHA III. Volume status mildly elevated.  LDH stable.  - Will need IV Lasix in between blood.  - Hold coumadin and aspirin.   2. CAD: s/p CABG.  No CP. Hold asa. Continue statin.   3. CKD: Stage III.  Creatinine baseline 1.7-2.0. Creatinine stable today at 1.93.  He is followed by Dr. Hollie Salk for nephrology.  4. Atrial fibrillation: Rate controlled. Holding coumadin for now with suspected GI bleed.   5. RV failure: Continue revatio. No bb.   6. HTN: MAP low today. Hold amlodipine.   7. Symptomatic Anemia: Suspected GI bleed. Hgb down to 6.5 from 8.4. Admit and transfuse 2U PRBCs. Check FOBT. Consult GI.  Recently relocated to [place] has not seen GI in [place] today. Give 2UPRBCs. Give 80 mg IV lasix in between blood. Likely will need scope once INR drifts down. Plan to start heparin once INR < 1.8. GI consulted.   Amy Clegg NP-C  02/10/2017  Patient seen with NP, agree with the above note.   He has noted black stool for about a week, first noted exertional dyspnea yesterday.  Seen in clinic today.  Suspect GI bleeding, possibly from small bowel AVM.  We had recently increased ASA to 325 daily with a mildly elevated LDH.  - Hold ASA - Hold warfarin, cover with heparin gtt when INR < 1.8.  - Consult GI, may need push enteroscopy.  - Transfuse 2 units PRBCs.  - If small bowel AVMs found, will need octreotide as  outpatient.   Mild volume overload on exam (has mild RV failure at baseline).  Will give Lasix 80 mg IV x 1 with blood, resume his home torsemide tomorrow most likely (will evaluate in the am).    Hold spironolactone and amlodipine for now with GI bleeding and MAP low for him (70s).    LDH is low for him.  He will need to go back on warfarin eventually, will need to decide on ASA (at most, will use 81 daily).   Loralie Champagne 02/10/2017 4:15 PM

## 2017-02-10 NOTE — Progress Notes (Signed)
ANTICOAGULATION CONSULT NOTE - Initial Consult  Pharmacy Consult for heparin when INR < 1.8 Indication: atrial fibrillation/LVAD  No Known Allergies  Patient Measurements: Heparin Dosing Weight: 85 kg   Vital Signs: BP: 110/62 (04/09 1611) Pulse Rate: 82 (04/09 1421)  Labs:  Recent Labs  02/10/17 1416  HGB 6.5*  HCT 20.1*  PLT 132*  LABPROT 27.5*  INR 2.51  CREATININE 1.93*    Estimated Creatinine Clearance: 35.4 mL/min (A) (by C-G formula based on SCr of 1.93 mg/dL (H)).   Medical History: Past Medical History:  Diagnosis Date  . Asthma   . CHF (congestive heart failure) ([place])   . Diabetes ([place])   . Kidney disease   . Sleep apnea    Assessment: 71 yo male on warfarin prior to admission for atrial fibrillation and LVAD admitted with symptomatic anemia.  INR 2.51 on admission and warfarin on hold in anticipation of GI workup. To transition to heparin when INR < 1.8.     Goal of Therapy:  Heparin level 0.3-0.7 units/ml Monitor platelets by anticoagulation protocol: Yes   Plan:  Began heparin infusion when INR < 1.8 INR in am  Vincenza Hews, PharmD, BCPS 02/10/2017, 4:17 PM

## 2017-02-10 NOTE — Progress Notes (Signed)
HF LVAD/Cardiology: Dr. [name] a 71 y.o.malewith a history of CAD s/p CABG x 4 2010, OSA, AS with TAVR 2015, CKD, DM2, paroxysmal atrial fibrillation, asthma, and ischemic cardiomyopathy with Medtronic ICD.   Relocated to [place] from [place] in early 2018. Over the last couple of years, he had been admitted multiple times for CHF. Last admission in [place] was in 1/18. Sounds like the hospitalization was complicated by cardiorenal syndrome and there was consideration of doing dialysis.  He was [facility] in [facility]16 with NYHA class IV symptoms and hypotension. He was placed on dual inotropes to facilitate diuresis, cardiac output and renal function.  CT surgery consulted for mechanical support and he was deemed appropriate for LVAD work up. He completed LVAD work up and was approved for HMII LVAD --> DT. On 12/11/16, he had IABP placed to optimize cardiac output and improve renal function. He then underwent HMII LVAD placement + tricuspid valve repair on 12/14/2015. Post operatively pressors weaned off slowly. He required significant diuresis.  Course was complicated by RV dysfunction and renal dysfunction.  Today he returns for LVAD follow up. Complaining of fatigue. Mild dyspnea with exertion. 5 days he started having 4-5 bms a day. No brbpr but reports black bowel movements. Has not been seen by GI in 10 years (last seen [place]) .Weight at home 184-185 pounds.No fever or chills. No syncope/presyncope. Able to walk without a walker. Appetite ok.  Taking all medications.   .  PMH: 1. CAD: s/p CABG in 2010.  2. Aortic stenosis: s/p TAVR in 2015. Valve looked ok on 2/18 echo.  3. Atrial fibrillation: Chronic.  4. Type II diabetes.  5. CKD: Stage III, cardiorenal syndrome.  6. Chronic systolic CHF: Ischemic cardiomyopathy with prominent RV dysfunction.  Medtronic ICD.  - Echo (2/18): EF 20-25%, moderately dilated LV, moderately dilated/moderately  dysfunctional RV, severe TR.  - Cardiogenic shock 2/18 requiring milrinone, norepinephrine, and IABP.  - Heartmate II LVAD for DT placed 12/13/16.   7. OSA.  8. Thrombocytopenia: Post-op LVAD, resolved.  9. Tricuspid regurgitation: Severe, s/p repair with LVAD placement in 2/18.   Social History   Social History  . Marital status: Widowed    Spouse name: N/A  . Number of children: 0  . Years of education: 14   Occupational History  . Not on file.   Social History Main Topics  . Smoking status: Never Smoker  . Smokeless tobacco: Never Used  . Alcohol use No  . Drug use: No  . Sexual activity: Not on file   Other Topics Concern  . Not on file   Social History Narrative   Patient is a widow. Moved to [place] from [place], [place]. Currently lives with his sister Anirudh Baiz.    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    Family History  Problem Relation Age of Onset  . Heart failure Father   . Heart attack Father   . Healthy Mother   . Diabetes Paternal Grandfather    ROS: All systems reviewed and negative except as per HPI.   Current Outpatient Prescriptions  Medication Sig Dispense Refill  . albuterol (PROVENTIL HFA;VENTOLIN HFA) 108 (90 Base) MCG/ACT inhaler Inhale 2 puffs into the lungs every 4 (four) hours as needed for wheezing or shortness of breath. 1 Inhaler 5  . albuterol (PROVENTIL) (2.5 MG/3ML) 0.083% nebulizer solution Take 3 mLs (2.5 mg total) by nebulization every 4 (  four) hours. And as needed 150 mL 5  . amLODipine (NORVASC) 2.5 MG tablet Take 1 tablet (2.5 mg total) by mouth daily. 30 tablet 6  . ascorbic acid (VITAMIN C) 1000 MG tablet Take 1,000 mg by mouth daily.    Marland Kitchen aspirin 325 MG tablet Take 325 mg by mouth daily.    Marland Kitchen atorvastatin (LIPITOR) 40 MG tablet Take 1 tablet (40 mg total) by mouth daily. 30 tablet 6  . benzonatate (TESSALON) 100 MG capsule Take 1 capsule (100 mg total) by mouth 3 (three) times daily as needed for cough. 20  capsule 6  . busPIRone (BUSPAR) 5 MG tablet Take 1 tablet (5 mg total) by mouth 2 (two) times daily. 60 tablet 6  . DOK 100 MG capsule TAKE 2 CAPSULES(200 MG) BY MOUTH DAILY 30 capsule 3  . fluticasone (FLOVENT HFA) 110 MCG/ACT inhaler Inhale 2 puffs into the lungs 2 (two) times daily. 1 Inhaler 5  . gabapentin (NEURONTIN) 600 MG tablet Take 0.5 tablets (300 mg total) by mouth 2 (two) times daily. 60 tablet 6  . insulin glargine (LANTUS) 100 unit/mL SOPN Inject 0.2 mLs (20 Units total) into the skin at bedtime. 15 mL 0  . levothyroxine (SYNTHROID, LEVOTHROID) 25 MCG tablet Take 1 tablet (25 mcg total) by mouth daily before breakfast. 30 tablet 6  . Multiple Vitamin (MULTIVITAMIN) tablet Take 1 tablet by mouth daily.    . pantoprazole (PROTONIX) 40 MG tablet Take 1 tablet (40 mg total) by mouth daily at 12 noon. 30 tablet 6  . sildenafil (REVATIO) 20 MG tablet Take 1 tablet (20 mg total) by mouth 3 (three) times daily. 90 tablet 6  . spironolactone (ALDACTONE) 25 MG tablet Take 0.5 tablets (12.5 mg total) by mouth daily. 30 tablet 6  . Tiotropium Bromide-Olodaterol (STIOLTO RESPIMAT) 2.5-2.5 MCG/ACT AERS Inhale 2 puffs into the lungs daily.    Marland Kitchen torsemide (DEMADEX) 20 MG tablet Take 3 tablets (60 mg total) by mouth daily. 90 tablet 6  . traMADol (ULTRAM) 50 MG tablet Take 1 tablet (50 mg total) by mouth every 6 (six) hours as needed for moderate pain. 30 tablet 0  . traZODone (DESYREL) 50 MG tablet TAKE 1 TABLET(50 MG) BY MOUTH AT BEDTIME AS NEEDED FOR SLEEP 30 tablet 3  . warfarin (COUMADIN) 5 MG tablet Take 1.5 tablets (7.5 mg total) by mouth daily. 45 tablet 6   No current facility-administered medications for this encounter.    BP (!) 76/0 Comment: doppler map  Pulse 82   Ht 5\' 5"  (1.651 m)   Wt 189 lb (85.7 kg)   SpO2 97%   BMI 31.45 kg/m  General: NAD. Pale walked slowly in the clinic Neck: JVP JVP 8-9 cm, no thyromegaly or thyroid nodule.  Lungs: CTAB on room air.  CV: LVAD hum.  R  and LLE 1+ edema.   No carotid bruit.   Abdomen: Soft, NT, ND.   Skin: Intact without lesions or rashes. Drive line dressing intact with anchor in place.  Neurologic: Alert and oriented x 3.  Psych: Normal affect. Extremities: No clubbing or cyanosis. R and LLE 1+ edema. Warm.  HEENT: Normal.   Assessment/Plan: 1. Chronic systolic CHF: Ischemic cardiomyopathy, EF 20-25% with RV dysfunction on 2/18 echo pre-LVAD.  s/p Heartmate II LVAD 2/18 for DT    Medtronic ICD.   NYHA III. Volume status mildly elevated. Will need IV lasix in between blood.  Hold coumadin and aspirin.  LDH stable.  2. CAD:  s/p CABG.  No CP. Hold asa. Continue statin.   3. CKD: Stage III.  Creatinine baseline 1.7-2.0. Stable today at 1.93.   He is followed by Dr. Hollie Salk for nephrology.  4. Atrial fibrillation: Rate controlled. Holding coumadin for now with suspected GI bleed.   5. RV failure: Continue revatio. No bb.   6. HTN: MAP low today. Hold amlodipine.   7. Symptomatic Anemia:Suspected GI bleed. Hgb down to 6.5 from 8.4. Admit and transfuse 2UPRBCs. Check FOBT. Consult GI.  Recently relocated to [place] has not seen GI in [facility] today. Give 2UPRBCs. Likely will need scope once INR drifts down. Plan to start heparin once INR < 1.8   Vernis Eid NP-C  02/10/2017

## 2017-02-11 DIAGNOSIS — K921 Melena: Secondary | ICD-10-CM

## 2017-02-11 LAB — CBC WITH DIFFERENTIAL/PLATELET
Basophils Absolute: 0 10*3/uL (ref 0.0–0.1)
Basophils Relative: 0 %
Eosinophils Absolute: 0.2 10*3/uL (ref 0.0–0.7)
Eosinophils Relative: 2 %
HEMATOCRIT: 23.2 % — AB (ref 39.0–52.0)
Hemoglobin: 7.7 g/dL — ABNORMAL LOW (ref 13.0–17.0)
Lymphocytes Relative: 15 %
Lymphs Abs: 1.3 10*3/uL (ref 0.7–4.0)
MCH: 31 pg (ref 26.0–34.0)
MCHC: 33.2 g/dL (ref 30.0–36.0)
MCV: 93.5 fL (ref 78.0–100.0)
MONO ABS: 0.8 10*3/uL (ref 0.1–1.0)
Monocytes Relative: 9 %
Neutro Abs: 6.6 10*3/uL (ref 1.7–7.7)
Neutrophils Relative %: 74 %
PLATELETS: 111 10*3/uL — AB (ref 150–400)
RBC: 2.48 MIL/uL — AB (ref 4.22–5.81)
RDW: 21.6 % — AB (ref 11.5–15.5)
WBC: 8.9 10*3/uL (ref 4.0–10.5)

## 2017-02-11 LAB — GLUCOSE, CAPILLARY
GLUCOSE-CAPILLARY: 226 mg/dL — AB (ref 65–99)
Glucose-Capillary: 228 mg/dL — ABNORMAL HIGH (ref 65–99)
Glucose-Capillary: 145 mg/dL — ABNORMAL HIGH (ref 65–99)
Glucose-Capillary: 311 mg/dL — ABNORMAL HIGH (ref 65–99)

## 2017-02-11 LAB — PROTIME-INR
INR: 2.43
PROTHROMBIN TIME: 26.9 s — AB (ref 11.4–15.2)

## 2017-02-11 LAB — IRON AND TIBC
Iron: 84 ug/dL (ref 45–182)
SATURATION RATIOS: 31 % (ref 17.9–39.5)
TIBC: 267 ug/dL (ref 250–450)
UIBC: 183 ug/dL

## 2017-02-11 LAB — BASIC METABOLIC PANEL
ANION GAP: 8 (ref 5–15)
BUN: 51 mg/dL — AB (ref 6–20)
CALCIUM: 8.5 mg/dL — AB (ref 8.9–10.3)
CO2: 30 mmol/L (ref 22–32)
Chloride: 99 mmol/L — ABNORMAL LOW (ref 101–111)
Creatinine, Ser: 1.69 mg/dL — ABNORMAL HIGH (ref 0.61–1.24)
GFR calc Af Amer: 45 mL/min — ABNORMAL LOW (ref >60)
GFR calc non Af Amer: 39 mL/min — ABNORMAL LOW (ref >60)
GLUCOSE: 198 mg/dL — AB (ref 65–99)
Potassium: 2.9 mmol/L — ABNORMAL LOW (ref 3.5–5.1)
Sodium: 137 mmol/L (ref 135–145)

## 2017-02-11 LAB — PREPARE RBC (CROSSMATCH)

## 2017-02-11 LAB — LACTATE DEHYDROGENASE: LDH: 246 U/L — AB (ref 98–192)

## 2017-02-11 MED ORDER — SODIUM CHLORIDE 0.9 % IV SOLN: Freq: Once | INTRAVENOUS | Status: DC

## 2017-02-11 MED ORDER — FUROSEMIDE 10 MG/ML IJ SOLN
80.0000 mg | Freq: Once | INTRAMUSCULAR | Status: AC
  Administered 2017-02-11: 80 mg via INTRAVENOUS
  Filled 2017-02-11: qty 8

## 2017-02-11 MED ORDER — SPIRONOLACTONE 25 MG PO TABS
12.5000 mg | ORAL_TABLET | Freq: Every day | ORAL | Status: DC
  Administered 2017-02-11: 12.5 mg via ORAL
  Filled 2017-02-11: qty 1

## 2017-02-11 MED ORDER — TRAMADOL HCL 50 MG PO TABS
100.0000 mg | ORAL_TABLET | Freq: Once | ORAL | Status: AC
  Administered 2017-02-11: 100 mg via ORAL
  Filled 2017-02-11: qty 2

## 2017-02-11 MED ORDER — POTASSIUM CHLORIDE CRYS ER 20 MEQ PO TBCR
40.0000 meq | EXTENDED_RELEASE_TABLET | Freq: Two times a day (BID) | ORAL | Status: DC
  Administered 2017-02-11 – 2017-02-20 (×18): 40 meq via ORAL
  Filled 2017-02-11 (×19): qty 2

## 2017-02-11 NOTE — Progress Notes (Signed)
ANTICOAGULATION CONSULT NOTE -Follow up Pharmacy Consult for heparin when INR < 1.8 Indication: atrial fibrillation/LVAD  No Known Allergies  Patient Measurements: Heparin Dosing Weight: 85 kg   Vital Signs: Temp: 97.6 F (36.4 C) (04/10 0356) Temp Source: Oral (04/10 0356) BP: 96/72 (04/10 0356) Pulse Rate: 88 (04/10 0356)  Labs:  Recent Labs  02/10/17 1416 02/11/17 0711  HGB 6.5* 7.7*  HCT 20.1* 23.2*  PLT 132* PENDING  LABPROT 27.5* 26.9*  INR 2.51 2.43  CREATININE 1.93* 1.69*    Estimated Creatinine Clearance: 40.1 mL/min (A) (by C-G formula based on SCr of 1.69 mg/dL (H)).   Medical History: Past Medical History:  Diagnosis Date  . Asthma   . CHF (congestive heart failure) ([place])   . Diabetes ([place])   . Kidney disease   . Sleep apnea    Assessment: 71 yo male on warfarin prior to admission for atrial fibrillation and LVAD admitted with symptomatic anemia.  INR 2.43 this AM.  INR was 2. 51 on admission yesterday 02/10/17 and warfarin placed on hold in anticipation of GI workup. To transition to heparin when INR < 1.8.    INR 2.43 this AM. ?GIB- GI consulted and commmends colonoscopy and EGD/enteroscopy when INR =or<1.5    Goal of Therapy:  Heparin level 0.3-0.7 units/ml Monitor platelets by anticoagulation protocol: Yes   Plan:  Began heparin infusion when INR < 1.8 INR in am  Nicole Cella, RPh Clinical Pharmacist Pager: [phone] 8A-4P [phone] 4P-10P [place] [phone] 02/11/2017, 8:41 AM

## 2017-02-11 NOTE — Progress Notes (Addendum)
Inpatient Diabetes Program Recommendations  AACE/ADA: New Consensus Statement on Inpatient Glycemic Control (2015)  Target Ranges:  Prepandial:   less than 140 mg/dL      Peak postprandial:   less than 180 mg/dL (1-2 hours)      Critically ill patients:  140 - 180 mg/dL   Lab Results  Component Value Date   GLUCAP 228 (H) 02/11/2017   HGBA1C 7.1 (H) 12/03/2016    Review of Glycemic Control Results for JEFFREE, CAZEAU (MRN [id number]) as of 02/11/2017 10:30  Ref. Range 02/10/2017 16:54 02/10/2017 21:09 02/11/2017 06:14  Glucose-Capillary Latest Ref Range: 65 - 99 mg/dL 131 (H) 214 (H) 228 (H)   Diabetes history: DM2 Outpatient Diabetes medications: Lantus 20 units qd Current orders for Inpatient glycemic control: Novolog correction 0-15 tid with meals  Inpatient Diabetes Program Recommendations:  Please consider: -Lantus 10 units qd -Add hs Novolog correction 0-5 units -Change to carb modified diet  Thank you, Nani Gasser. Lakiya Cottam, RN, MSN, CDE  Diabetes Coordinator Inpatient Glycemic Control Team Team Pager [phone] (8am-5pm) 02/11/2017 10:34 AM

## 2017-02-11 NOTE — Progress Notes (Signed)
LVAD Coordinator rounding note:  Admitted 02/10/17 due to GI bleed.   HeartMate II LVAD implated on 12/13/16 by Dr. Prescott Gum.   Vital signs: HR: 67 Doppler Pressure:89 Automatic BP: 101/76 O2 Sat: 99 Wt:84.6     LVAD interrogation reveals:  Speed:9000 Flow: 5.3 Power:  6 PI:5.1  Alarms: none Events:  none Fixed speed: 9000 Low speed limit: 8400  Drive Line: Unremarkable  Labs:  LDH trend:246  INR trend: 2.43  Anticoagulation Plan: -INR Goal: 2-2.5 -ASA Dose: 325 mg (on hold)  Adverse Events on VAD: -none  Plan/Recommendations:   1. Scope per GI when INR sub-therapeutic.  Balinda Quails RN, VAD Coordinator 24/7 pager [phone]

## 2017-02-11 NOTE — Progress Notes (Signed)
HeartMate 2 Rounding Note  Subjective:    Admitted with symptomatic anemia. Received 2UPRBCs. hgb on admit 6.5. Todays Hgb 7.7 .   Denies SOB. No BMs over night.   LVAD INTERROGATION:  HeartMate II LVAD:  Flow 5.5 liters/min, speed9000, power 5, PI 5.9 .   Objective:    Vital Signs:   Temp:  [97.5 F (36.4 C)-98.6 F (37 C)] 97.6 F (36.4 C) (04/10 0356) Pulse Rate:  [73-90] 88 (04/10 0356) Resp:  [18] 18 (04/10 0356) BP: (73-110)/(49-90) 96/72 (04/10 0356) SpO2:  [94 %-99 %] 99 % (04/10 0717) Weight:  [186 lb 6.4 oz (84.6 kg)-187 lb 4.8 oz (85 kg)] 186 lb 6.4 oz (84.6 kg) (04/10 0353) Last BM Date: 02/10/17 Mean arterial Pressure 88  Intake/Output:   Intake/Output Summary (Last 24 hours) at 02/11/17 0838 Last data filed at 02/11/17 0356  Gross per 24 hour  Intake              669 ml  Output              800 ml  Net             -131 ml     Physical Exam: General:  Well appearing. No resp difficulty. Sitting on the side of the bed.  HEENT: normal Neck: supple. JVP ~10-12 Carotids 2+ bilat; no bruits. No lymphadenopathy or thryomegaly appreciated. Cor: Mechanical heart sounds with LVAD hum present. Lungs: clear on room air.  Abdomen: soft, nontender, nondistended. No hepatosplenomegaly. No bruits or masses. Good bowel sounds. Driveline: C/D/I; securement device intact and driveline incorporated Extremities: no cyanosis, clubbing, rash, R and LLE 2+ edema. Ted hose on bilaterally  Neuro: alert & orientedx3, cranial nerves grossly intact. moves all 4 extremities w/o difficulty. Affect pleasant  Telemetry:  Afib 80s  Personally reviewed.   Labs: Basic Metabolic Panel:  Recent Labs Lab 02/10/17 1416 02/11/17 0711  NA 137 137  K 3.7 2.9*  CL 97* 99*  CO2 29 30  GLUCOSE 152* 198*  BUN 61* 51*  CREATININE 1.93* 1.69*  CALCIUM 9.0 8.5*    Liver Function Tests: No results for input(s): AST, ALT, ALKPHOS, BILITOT, PROT, ALBUMIN in the last 168 hours. No results  for input(s): LIPASE, AMYLASE in the last 168 hours. No results for input(s): AMMONIA in the last 168 hours.  CBC:  Recent Labs Lab 02/10/17 1416 02/11/17 0711  WBC 10.2 8.9  NEUTROABS  --  PENDING  HGB 6.5* 7.7*  HCT 20.1* 23.2*  MCV 97.6 93.5  PLT 132* PENDING    INR:  Recent Labs Lab 02/10/17 1416 02/11/17 0711  INR 2.51 2.43    Other results:  EKG:   Imaging:  No results found.   Medications:     Scheduled Medications: . albuterol  2.5 mg Nebulization TID  . atorvastatin  40 mg Oral Daily  . busPIRone  5 mg Oral BID  . docusate sodium  200 mg Oral Daily  . gabapentin  300 mg Oral BID  . insulin aspart  0-15 Units Subcutaneous TID WC  . levothyroxine  25 mcg Oral QAC breakfast  . multivitamin with minerals  1 tablet Oral Daily  . pantoprazole  40 mg Oral Q1200  . sildenafil  20 mg Oral TID  . sodium chloride flush  3 mL Intravenous Q12H     Infusions:   PRN Medications:  sodium chloride, acetaminophen, albuterol, ondansetron (ZOFRAN) IV, sodium chloride flush, traZODone   Assessment/Plan   1.  Symptomatic Anemia- Hgb on admit 6.5 . Received 2U PRBCs. Hgb 7.7 today.  Give another unit of PRBCs.  GI consult appreciated.Plan EGD/Colonoscopy once INR < 1.5. Check FOBT. Off asa and coumadin.  2. Acute/Chronic Systolic Heart Failure : ICM. HMII LVAD for DT. Medtronic ICD Hold aspirin and coumadin. Heparin once INR < 1.8  Volume status elevated. Give 80 mg IV lasix now prior to unit of blood.  No bb with RV failure. Restart low dose spiro.  3. CKD Stage III- Creatinine  1.69 Watch closely.  4. A FIb- Chronic- Rate controlled. No coumadin with GI bleed.  5. HTN- Map 88. Continue current regimen. Add 12.5 mg spiro  6. 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 Continue sildenafil.  7. Hypokalemia - Supplement K.   I reviewed the LVAD parameters from today, and compared the results to the patient's prior recorded data.  No programming changes were made.  The LVAD is  functioning within specified parameters.  The patient performs LVAD self-test daily.  LVAD interrogation was negative for any significant power changes, alarms or PI events/speed drops.  LVAD equipment check completed and is in good working order.  Back-up equipment present.   LVAD education done on emergency procedures and precautions and reviewed exit site care.  Length of Stay: 1  Amy Clegg 02/11/2017, 8:38 AM  VAD Team --- VAD ISSUES ONLY--- Pager [phone] (7am - 7am)  Advanced Heart Failure Team  Pager [phone] (M-F; 7a - 4p)  Please contact [facility] for night-coverage after hours (4p -7a ) and weekends on [email or web address]  Patient seen with NP, agree with the above note.  INR 2.4 today, no more melena.  Seen by GI, plan for EGD/enteroscopy and c-scope when INR 1.5 or below.  Heparin gtt when INR < 1.8.    Has baseline RV failure with increased JVP, but appears to have more volume on board than usual after transfusions (got another unit today).  Will give a 2nd dose of IV Lasix 80 mg today, follow I/Os closely, reassess diuretic regimen tomorrow.   LVAD parameters stable.   Loralie Champagne 02/11/2017

## 2017-02-11 NOTE — Progress Notes (Signed)
[facility]  Patient Status: Active (receiving services up to time of hospitalization)  [facility] is providing the following services: RN  If patient discharges after hours, please call [phone].   Edwinna Areola 02/11/2017, 9:28 AM

## 2017-02-12 ENCOUNTER — Telehealth (HOSPITAL_COMMUNITY): Payer: Self-pay | Admitting: General Practice

## 2017-02-12 LAB — BPAM RBC
Blood Product Expiration Date: 201804162359
Blood Product Expiration Date: 201804202359
Blood Product Expiration Date: 201804252359
ISSUE DATE / TIME: 201804100056
ISSUE DATE / TIME: 201804092047
ISSUE DATE / TIME: 201804101204
UNIT TYPE AND RH: 600
UNIT TYPE AND RH: 600
Unit Type and Rh: 600

## 2017-02-12 LAB — TYPE AND SCREEN
ABO/RH(D): A NEG
ANTIBODY SCREEN: NEGATIVE
Unit division: 0
Unit division: 0
Unit division: 0

## 2017-02-12 LAB — CBC
HEMATOCRIT: 25.5 % — AB (ref 39.0–52.0)
Hemoglobin: 8.3 g/dL — ABNORMAL LOW (ref 13.0–17.0)
MCH: 30.1 pg (ref 26.0–34.0)
MCHC: 32.5 g/dL (ref 30.0–36.0)
MCV: 92.4 fL (ref 78.0–100.0)
Platelets: 121 10*3/uL — ABNORMAL LOW (ref 150–400)
RBC: 2.76 MIL/uL — AB (ref 4.22–5.81)
RDW: 23.1 % — AB (ref 11.5–15.5)
WBC: 9.6 10*3/uL (ref 4.0–10.5)

## 2017-02-12 LAB — BASIC METABOLIC PANEL
Anion gap: 13 (ref 5–15)
BUN: 46 mg/dL — ABNORMAL HIGH (ref 6–20)
CO2: 28 mmol/L (ref 22–32)
Calcium: 8.8 mg/dL — ABNORMAL LOW (ref 8.9–10.3)
Chloride: 96 mmol/L — ABNORMAL LOW (ref 101–111)
Creatinine, Ser: 1.69 mg/dL — ABNORMAL HIGH (ref 0.61–1.24)
GFR calc Af Amer: 45 mL/min — ABNORMAL LOW (ref >60)
GFR calc non Af Amer: 39 mL/min — ABNORMAL LOW (ref >60)
GLUCOSE: 88 mg/dL (ref 65–99)
POTASSIUM: 3.4 mmol/L — AB (ref 3.5–5.1)
Sodium: 137 mmol/L (ref 135–145)

## 2017-02-12 LAB — GLUCOSE, CAPILLARY
GLUCOSE-CAPILLARY: 121 mg/dL — AB (ref 65–99)
Glucose-Capillary: 140 mg/dL — ABNORMAL HIGH (ref 65–99)
Glucose-Capillary: 221 mg/dL — ABNORMAL HIGH (ref 65–99)
Glucose-Capillary: 113 mg/dL — ABNORMAL HIGH (ref 65–99)

## 2017-02-12 LAB — PROTIME-INR
INR: 2
INR: 1.73
Prothrombin Time: 23 seconds — ABNORMAL HIGH (ref 11.4–15.2)
Prothrombin Time: 20.5 seconds — ABNORMAL HIGH (ref 11.4–15.2)

## 2017-02-12 LAB — LACTATE DEHYDROGENASE: LDH: 258 U/L — ABNORMAL HIGH (ref 98–192)

## 2017-02-12 MED ORDER — POTASSIUM CHLORIDE CRYS ER 20 MEQ PO TBCR
40.0000 meq | EXTENDED_RELEASE_TABLET | Freq: Once | ORAL | Status: AC
  Administered 2017-02-12: 40 meq via ORAL
  Filled 2017-02-12: qty 2

## 2017-02-12 MED ORDER — SPIRONOLACTONE 25 MG PO TABS
25.0000 mg | ORAL_TABLET | Freq: Every day | ORAL | Status: DC
  Administered 2017-02-12 – 2017-02-20 (×8): 25 mg via ORAL
  Filled 2017-02-12 (×9): qty 1

## 2017-02-12 MED ORDER — BISACODYL 5 MG PO TBEC
5.0000 mg | DELAYED_RELEASE_TABLET | Freq: Three times a day (TID) | ORAL | Status: AC
Start: 2017-02-13 — End: 2017-02-13
  Administered 2017-02-13: 5 mg via ORAL
  Filled 2017-02-12: qty 1

## 2017-02-12 MED ORDER — FUROSEMIDE 10 MG/ML IJ SOLN
80.0000 mg | Freq: Once | INTRAMUSCULAR | Status: AC
  Administered 2017-02-12: 80 mg via INTRAVENOUS
  Filled 2017-02-12: qty 8

## 2017-02-12 MED ORDER — HEPARIN (PORCINE) IN NACL 100-0.45 UNIT/ML-% IJ SOLN
1200.0000 [IU]/h | INTRAMUSCULAR | Status: DC
  Administered 2017-02-12: 1000 [IU]/h via INTRAVENOUS
  Filled 2017-02-12 (×2): qty 250

## 2017-02-12 MED ORDER — POTASSIUM CHLORIDE CRYS ER 20 MEQ PO TBCR
20.0000 meq | EXTENDED_RELEASE_TABLET | Freq: Once | ORAL | Status: AC
  Administered 2017-02-12: 20 meq via ORAL
  Filled 2017-02-12: qty 1

## 2017-02-12 NOTE — Progress Notes (Signed)
          Daily Rounding Note  02/12/2017, 9:19 AM  LOS: 2 days   SUBJECTIVE:   Chief complaint: fatigue, DOE: improved after transfusion.  Walking in halls easily.   No BM during day yesterday, 2 small overnite, 2 larger this AM: formed, black.   OBJECTIVE:         Vital signs in last 24 hours:    Temp:  [97.6 F (36.4 C)-98.1 F (36.7 C)] 97.6 F (36.4 C) (04/11 0400) Pulse Rate:  [67-89] 75 (04/11 0400) Resp:  [18] 18 (04/10 1440) BP: (91-101)/(70-78) 91/75 (04/11 0400) SpO2:  [96 %-99 %] 97 % (04/11 0400) Weight:  [84.8 kg (187 lb)] 84.8 kg (187 lb) (04/11 0400) Last BM Date: 02/11/17 Filed Weights   02/10/17 1611 02/11/17 0353 02/12/17 0400  Weight: 85 kg (187 lb 4.8 oz) 84.6 kg (186 lb 6.4 oz) 84.8 kg (187 lb)   General: pleasant, NAD   Heart: VAD hum with soft underlying beats audible  Chest: clear bil.  Overall reduced BS.  No dyspnea or cough Abdomen: soft, NT, active BS.  VAD driveline site bandaged.   Extremities: no CCE Neuro/Psych:  Pleasant, calm, fully alert and oriented.   Intake/Output from previous day: 04/10 0701 - 04/11 0700 In: -  Out: 1500 [Urine:1500]  Intake/Output this shift: No intake/output data recorded.  Lab Results:  Recent Labs  02/10/17 1416 02/11/17 0711 02/12/17 0238  WBC 10.2 8.9 9.6  HGB 6.5* 7.7* 8.3*  HCT 20.1* 23.2* 25.5*  PLT 132* 111* 121*   BMET  Recent Labs  02/10/17 1416 02/11/17 0711 02/12/17 0238  NA 137 137 137  K 3.7 2.9* 3.4*  CL 97* 99* 96*  CO2 29 30 28   GLUCOSE 152* 198* 88  BUN 61* 51* 46*  CREATININE 1.93* 1.69* 1.69*  CALCIUM 9.0 8.5* 8.8*   LFT No results for input(s): PROT, ALBUMIN, AST, ALT, ALKPHOS, BILITOT, BILIDIR, IBILI in the last 72 hours. PT/INR  Recent Labs  02/11/17 0711 02/12/17 0238  LABPROT 26.9* 23.0*  INR 2.43 2.00   Hepatitis Panel No results for input(s): HEPBSAG, HCVAB, HEPAIGM, HEPBIGM in the last 72  hours.  Studies/Results: No results found.  ASSESMENT:   *  GI bleed.  Likely upper.  Suspect AVMs   *  Blood loss anemia on top of chronic anemia. s/p PRBCs x 3 with slightly less than expected response (Hgb up just shy of 2 grams, expected would be 3 gm rise).   .    *  Chronic Coumadin.  INR remains in therapeutic range.  INR needs to be 1.5 or less for GI procedures.    *  Noncritical thrombocytopenia. This was present intermittently during his February admission as well.  *  Hypokalemia, improved but not resolved.   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     PLAN   *  Hopefully colon/enteroscopy on 4/13.    Azucena Freed  02/12/2017, 9:19 AM Pager: [phone]  ________________________________________________________________________  Velora Heckler GI MD note:  I reviewed the data above and agree with the assessment and plan described.   Owens Loffler, MD [facility] Pager [phone]

## 2017-02-12 NOTE — Progress Notes (Addendum)
LVAD Coordinator rounding note:  Admitted 02/10/17 due to GI bleed. 3 units of PRBC's to date.  HeartMate II LVAD implated on 12/13/16 by Dr. Prescott Gum.    Vital signs: HR: 67 Doppler Pressure:89 Automatic BP: 91/75 O2 Sat: 99 Wt:84.8 kg    LVAD interrogation reveals:  Speed:9000 Flow: 5.3 Power:  5 PI: 6.1  Alarms: none Events:  none Fixed speed: 9000 Low speed limit: 8400  Drive Line: Unremarkable. Due to be changed Thursday 02/13/17  Labs:  LDH trend:246>258  INR trend: 2.43>2.00  Anticoagulation Plan: -INR Goal: 2-2.5 (1.5 for possible GI procedures) -ASA Dose: 325 mg (on hold)  Adverse Events on VAD: -none  Plan/Recommendations:   1. Scope per GI when INR sub-therapeutic.  Balinda Quails RN, VAD Coordinator 24/7 pager [phone]

## 2017-02-12 NOTE — Telephone Encounter (Signed)
Patient is currently in hospital, patient has been admitted since 02/10/17. Unable to proceed with referral until patient is discharged and [place] reviews information to proceed with Cardiac Rehab.

## 2017-02-12 NOTE — Progress Notes (Signed)
HeartMate 2 Rounding Note  Subjective:    Admitted with symptomatic anemia. Admit Hgb 6.5. SO far he has received 3UPRBCs. Todays Hgb 8.3.    Overall feels ok. Denies SOB. Had 3 black BMS today. Denies abd pain.   LVAD INTERROGATION:  HeartMate II LVAD:  Flow 5.3 liters/min, speed 9000, power 5, PI 6.1   Objective:    Vital Signs:   Temp:  [97.6 F (36.4 C)-98.1 F (36.7 C)] 97.6 F (36.4 C) (04/11 0400) Pulse Rate:  [67-89] 75 (04/11 0400) Resp:  [18] 18 (04/10 1440) BP: (91-101)/(70-78) 91/75 (04/11 0400) SpO2:  [96 %-99 %] 97 % (04/11 0400) Weight:  [187 lb (84.8 kg)] 187 lb (84.8 kg) (04/11 0400) Last BM Date: 02/11/17 Mean arterial Pressure 70s     Intake/Output:   Intake/Output Summary (Last 24 hours) at 02/12/17 0803 Last data filed at 02/12/17 0400  Gross per 24 hour  Intake                0 ml  Output             1500 ml  Net            -1500 ml      Physical Exam: GENERAL: Well appearing, male. Sitting on the side of the bed.  HEENT: normal  NECK: Supple, JVP about 10 .  2+ bilaterally, no bruits.  No lymphadenopathy or thyromegaly appreciated.   CARDIAC:  Mechanical heart sounds with LVAD hum present.  LUNGS:  Clear to auscultation bilaterally. On room air.  ABDOMEN:  Soft, round, nontender, positive bowel sounds x4.     LVAD exit site: well-healed and incorporated.  Dressing dry and intact.  No erythema or drainage.  Stabilization device present and accurately applied.  Driveline dressing is being changed daily per sterile technique. EXTREMITIES:  Warm and dry, no cyanosis, clubbing, rash . R and LLE 1-2+ edema. Ted hose present.  NEUROLOGIC:  Alert and oriented x 4.  Gait steady.  No aphasia.  No dysarthria.  Affect pleasant.     Telemetry:  Afib 70-80s personally reviewed.   Labs: Basic Metabolic Panel:  Recent Labs Lab 02/10/17 1416 02/11/17 0711 02/12/17 0238  NA 137 137 137  K 3.7 2.9* 3.4*  CL 97* 99* 96*  CO2 29 30 28   GLUCOSE 152*  198* 88  BUN 61* 51* 46*  CREATININE 1.93* 1.69* 1.69*  CALCIUM 9.0 8.5* 8.8*    Liver Function Tests: No results for input(s): AST, ALT, ALKPHOS, BILITOT, PROT, ALBUMIN in the last 168 hours. No results for input(s): LIPASE, AMYLASE in the last 168 hours. No results for input(s): AMMONIA in the last 168 hours.  CBC:  Recent Labs Lab 02/10/17 1416 02/11/17 0711 02/12/17 0238  WBC 10.2 8.9 9.6  NEUTROABS  --  6.6  --   HGB 6.5* 7.7* 8.3*  HCT 20.1* 23.2* 25.5*  MCV 97.6 93.5 92.4  PLT 132* 111* 121*    INR:  Recent Labs Lab 02/10/17 1416 02/11/17 0711 02/12/17 0238  INR 2.51 2.43 2.00    Other results:  EKG:   Imaging: No results found.   Medications:     Scheduled Medications: . sodium chloride   Intravenous Once  . atorvastatin  40 mg Oral Daily  . busPIRone  5 mg Oral BID  . docusate sodium  200 mg Oral Daily  . gabapentin  300 mg Oral BID  . insulin aspart  0-15 Units Subcutaneous TID WC  .  levothyroxine  25 mcg Oral QAC breakfast  . multivitamin with minerals  1 tablet Oral Daily  . pantoprazole  40 mg Oral Q1200  . potassium chloride  40 mEq Oral BID  . sildenafil  20 mg Oral TID  . sodium chloride flush  3 mL Intravenous Q12H  . spironolactone  12.5 mg Oral Daily    Infusions:   PRN Medications: sodium chloride, acetaminophen, albuterol, ondansetron (ZOFRAN) IV, sodium chloride flush, traZODone   Assessment/Plan   1. Symptomatic Anemia- Hgb on admit 6.5 .  Overall received 3UPRBCs. Hgb 8.3   GI consult appreciated.Plan EGD/Colonoscopy once INR < 1.5. Todays INR 2.0  Off asa and coumadin.  2. Acute/Chronic Systolic Heart Failure : ICM. HMII LVAD for DT. Medtronic ICD Hold aspirin and coumadin. Heparin once INR < 1.8  Volume status elevated. Give 80 mg IV lasix today.  Increase spiro to 25 mg daily  No bb with RV failure.  3. CKD Stage III- Creatinine unchanged. Stable.   4. A FIb- Chronic- Rate controlled. No coumadin with GI bleed.   5. HTN- Map upper 70s. Continue current regimen 6. 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 Continue sildenafil.  7. Hypokalemia - K 3.4 . Give extra 40 meq K.  Increase spiro.   I reviewed the LVAD parameters from today, and compared the results to the patient's prior recorded data.  No programming changes were made.  The LVAD is functioning within specified parameters.  The patient performs LVAD self-test daily.  LVAD interrogation was negative for any significant power changes, alarms or PI events/speed drops.  LVAD equipment check completed and is in good working order.  Back-up equipment present.   LVAD education done on emergency procedures and precautions and reviewed exit site care.  Length of Stay: 2  Amy Clegg 02/12/2017, 8:03 AM  VAD Team --- VAD ISSUES ONLY--- Pager [phone] (7am - 7am)  Advanced Heart Failure Team  Pager [phone] (M-F; 7a - 4p)  Please contact [facility] for night-coverage after hours (4p -7a ) and weekends on [email or web address]  Patient seen with NP, agree with the above note.  INR 2.0 today, 4 black stools in last 24 hrs.  Seen by GI, plan for EGD/enteroscopy and c-scope when INR 1.5 or below.  Heparin gtt when INR < 1.8.  Repeat INR this afternoon.   Some ongoing volume overload by exam.  Will give 2 doses of IV Lasix again today, possibly back to torsemide tomorrow.   LVAD parameters stable.   Loralie Champagne 02/12/2017 2:41 PM

## 2017-02-12 NOTE — Progress Notes (Addendum)
ANTICOAGULATION CONSULT NOTE -Follow up Pharmacy Consult for heparin when INR < 1.8 Indication: atrial fibrillation/LVAD  No Known Allergies  Patient Measurements: Heparin Dosing Weight: 85 kg   Vital Signs: Temp: 97.6 F (36.4 C) (04/11 0400) Temp Source: Oral (04/11 0400) BP: 91/75 (04/11 0400) Pulse Rate: 75 (04/11 0400)  Labs:  Recent Labs  02/10/17 1416 02/11/17 0711 02/12/17 0238  HGB 6.5* 7.7* 8.3*  HCT 20.1* 23.2* 25.5*  PLT 132* 111* 121*  LABPROT 27.5* 26.9* 23.0*  INR 2.51 2.43 2.00  CREATININE 1.93* 1.69* 1.69*    Estimated Creatinine Clearance: 40.1 mL/min (A) (by C-G formula based on SCr of 1.69 mg/dL (H)).   Medical History: Past Medical History:  Diagnosis Date  . Asthma   . CHF (congestive heart failure) ([place])   . Diabetes ([place])   . Kidney disease   . Sleep apnea    Assessment: 71 yo male on warfarin prior to admission for atrial fibrillation and LVAD admitted with symptomatic anemia.    INR 2 this AM, warfarin still on hold in anticipation of GI workup. To transition to heparin when INR < 1.8. Will recheck INR tonight. Noted patient had 3 black BMs today.  Possible GIB- GI consulted and commmends colonoscopy and EGD/enteroscopy when INR =or<1.5   Goal of Therapy:  Heparin level 0.3-0.7 units/ml Monitor platelets by anticoagulation protocol: Yes   Plan:  Begin heparin when INR < 1.8 INR tonight  Erin Hearing PharmD., BCPS Clinical Pharmacist Pager [phone] 02/12/2017 10:03 AM  Addendum:  INR now down to 1.73 after recheck this afternoon. Will start heparin now. Will aim for lower end of heparin level goal (0.3-0.5) given possible bleeding.  02/12/2017 2:50 PM

## 2017-02-13 ENCOUNTER — Encounter (HOSPITAL_COMMUNITY): Payer: Medicare Other

## 2017-02-13 ENCOUNTER — Inpatient Hospital Stay (HOSPITAL_COMMUNITY): Payer: Medicare Other

## 2017-02-13 LAB — CBC
HCT: 25.6 % — ABNORMAL LOW (ref 39.0–52.0)
Hemoglobin: 8.4 g/dL — ABNORMAL LOW (ref 13.0–17.0)
MCH: 30.7 pg (ref 26.0–34.0)
MCHC: 32.8 g/dL (ref 30.0–36.0)
MCV: 93.4 fL (ref 78.0–100.0)
PLATELETS: 126 10*3/uL — AB (ref 150–400)
RBC: 2.74 MIL/uL — ABNORMAL LOW (ref 4.22–5.81)
RDW: 22.7 % — ABNORMAL HIGH (ref 11.5–15.5)
WBC: 9.8 10*3/uL (ref 4.0–10.5)

## 2017-02-13 LAB — BASIC METABOLIC PANEL
ANION GAP: 8 (ref 5–15)
BUN: 45 mg/dL — ABNORMAL HIGH (ref 6–20)
CO2: 31 mmol/L (ref 22–32)
Calcium: 9 mg/dL (ref 8.9–10.3)
Chloride: 97 mmol/L — ABNORMAL LOW (ref 101–111)
Creatinine, Ser: 1.81 mg/dL — ABNORMAL HIGH (ref 0.61–1.24)
GFR calc Af Amer: 42 mL/min — ABNORMAL LOW (ref >60)
GFR, EST NON AFRICAN AMERICAN: 36 mL/min — AB (ref >60)
GLUCOSE: 130 mg/dL — AB (ref 65–99)
POTASSIUM: 4.3 mmol/L (ref 3.5–5.1)
Sodium: 136 mmol/L (ref 135–145)

## 2017-02-13 LAB — LACTATE DEHYDROGENASE: LDH: 266 U/L — AB (ref 98–192)

## 2017-02-13 LAB — GLUCOSE, CAPILLARY
GLUCOSE-CAPILLARY: 167 mg/dL — AB (ref 65–99)
GLUCOSE-CAPILLARY: 135 mg/dL — AB (ref 65–99)
Glucose-Capillary: 148 mg/dL — ABNORMAL HIGH (ref 65–99)
Glucose-Capillary: 108 mg/dL — ABNORMAL HIGH (ref 65–99)

## 2017-02-13 LAB — CBC WITH DIFFERENTIAL/PLATELET
BASOS ABS: 0 10*3/uL (ref 0.0–0.1)
Basophils Relative: 0 %
EOS ABS: 0.3 10*3/uL (ref 0.0–0.7)
Eosinophils Relative: 3 %
HCT: 26.7 % — ABNORMAL LOW (ref 39.0–52.0)
HEMOGLOBIN: 8.8 g/dL — AB (ref 13.0–17.0)
LYMPHS PCT: 22 %
Lymphs Abs: 2.1 10*3/uL (ref 0.7–4.0)
MCH: 31 pg (ref 26.0–34.0)
MCHC: 33 g/dL (ref 30.0–36.0)
MCV: 94 fL (ref 78.0–100.0)
Monocytes Absolute: 0.7 10*3/uL (ref 0.1–1.0)
Monocytes Relative: 7 %
NEUTROS PCT: 68 %
Neutro Abs: 6.6 10*3/uL (ref 1.7–7.7)
Platelets: 140 10*3/uL — ABNORMAL LOW (ref 150–400)
RBC: 2.84 MIL/uL — AB (ref 4.22–5.81)
RDW: 22.1 % — ABNORMAL HIGH (ref 11.5–15.5)
WBC: 9.7 10*3/uL (ref 4.0–10.5)

## 2017-02-13 LAB — PROTIME-INR
INR: 1.6
Prothrombin Time: 19.3 seconds — ABNORMAL HIGH (ref 11.4–15.2)

## 2017-02-13 LAB — HEPARIN LEVEL (UNFRACTIONATED)
HEPARIN UNFRACTIONATED: 0.33 [IU]/mL (ref 0.30–0.70)
Heparin Unfractionated: 0.14 IU/mL — ABNORMAL LOW (ref 0.30–0.70)

## 2017-02-13 MED ORDER — PEG-KCL-NACL-NASULF-NA ASC-C 100 G PO SOLR
0.5000 | Freq: Once | ORAL | Status: AC
  Administered 2017-02-14: 100 g via ORAL
  Filled 2017-02-13 (×2): qty 1

## 2017-02-13 MED ORDER — TORSEMIDE 20 MG PO TABS
20.0000 mg | ORAL_TABLET | Freq: Three times a day (TID) | ORAL | Status: DC
  Administered 2017-02-13 – 2017-02-15 (×5): 20 mg via ORAL
  Filled 2017-02-13 (×3): qty 1

## 2017-02-13 MED ORDER — PEG-KCL-NACL-NASULF-NA ASC-C 100 G PO SOLR: 1.0000 | Freq: Once | ORAL | Status: DC

## 2017-02-13 MED ORDER — METOCLOPRAMIDE HCL 5 MG/ML IJ SOLN
10.0000 mg | Freq: Once | INTRAMUSCULAR | Status: AC
  Administered 2017-02-13: 10 mg via INTRAVENOUS
  Filled 2017-02-13: qty 2

## 2017-02-13 MED ORDER — TORSEMIDE 20 MG PO TABS
60.0000 mg | ORAL_TABLET | Freq: Every day | ORAL | Status: DC
  Administered 2017-02-13: 20 mg via ORAL
  Filled 2017-02-13: qty 3

## 2017-02-13 MED ORDER — METOCLOPRAMIDE HCL 5 MG/ML IJ SOLN
10.0000 mg | Freq: Once | INTRAMUSCULAR | Status: AC
  Administered 2017-02-14: 10 mg via INTRAVENOUS
  Filled 2017-02-13: qty 2

## 2017-02-13 MED ORDER — PEG-KCL-NACL-NASULF-NA ASC-C 100 G PO SOLR
0.5000 | Freq: Once | ORAL | Status: AC
  Administered 2017-02-13: 100 g via ORAL
  Filled 2017-02-13: qty 1

## 2017-02-13 NOTE — Progress Notes (Signed)
Dressing changed per protocol using sterile technique and aquacell ag gauze. Large yellow crusted scab noted at drive line exit site and increased movement of drive line. Trevor Grinder NP notified and plans to observe site during rounds in morning. Patient does not complain of pain at site and no other irritation noted.  Trevor Quails RN, VAD Coordinator 24/7 pager [phone]

## 2017-02-13 NOTE — Progress Notes (Signed)
Pt experiencing dark red bowel movements after finishing bowel prep. Vital signs stable. Map elevated at 96. Notified VAD team. New orders given. Will continue to monitor.

## 2017-02-13 NOTE — Progress Notes (Signed)
ANTICOAGULATION CONSULT NOTE -Follow up Pharmacy Consult for heparin when INR < 1.8 Indication: atrial fibrillation/LVAD  No Known Allergies  Patient Measurements: Heparin Dosing Weight: 85 kg   Vital Signs: Temp: 97.5 F (36.4 C) (04/12 1324) Temp Source: Oral (04/12 1324) BP: 97/72 (04/12 0400) Pulse Rate: 83 (04/12 1324)  Labs:  Recent Labs  02/11/17 0711 02/12/17 0238 02/12/17 1356 02/13/17 0016 02/13/17 1115  HGB 7.7* 8.3*  --  8.4*  --   HCT 23.2* 25.5*  --  25.6*  --   PLT 111* 121*  --  126*  --   LABPROT 26.9* 23.0* 20.5* 19.3*  --   INR 2.43 2.00 1.73 1.60  --   HEPARINUNFRC  --   --   --  0.14* 0.33  CREATININE 1.69* 1.69*  --  1.81*  --     Estimated Creatinine Clearance: 37 mL/min (A) (by C-G formula based on SCr of 1.81 mg/dL (H)).   Medical History: Past Medical History:  Diagnosis Date  . Asthma   . CHF (congestive heart failure) ([place])   . Diabetes ([place])   . Kidney disease   . Sleep apnea    Assessment: 71 yo male on warfarin prior to admission for atrial fibrillation and LVAD admitted with symptomatic anemia.  Noted patient had 3 black BMs Plan enteroscopy tomorrow.    INR 1.6 - warfarin still on hold in anticipation of GI workup. Heparin while INR < 1.8.   Heparin drip 1200 uts/hr HL 0.33 at goal.     Goal of Therapy:  Heparin level 0.3-0.5 - GIB Monitor platelets by anticoagulation protocol: Yes   Plan:   Heparin drip 1200 uts/hr Daily HL, Protime.  [name] Pharm.D. CPP, BCPS Clinical Pharmacist [phone] 02/13/2017 3:43 PM

## 2017-02-13 NOTE — Progress Notes (Signed)
ANTICOAGULATION CONSULT NOTE - Follow Up Consult  Pharmacy Consult for heparin Indication: LVAD  Labs:  Recent Labs  02/11/17 0711 02/12/17 0238 02/12/17 1356 02/13/17 0016  HGB 7.7* 8.3*  --  8.4*  HCT 23.2* 25.5*  --  25.6*  PLT 111* 121*  --  126*  LABPROT 26.9* 23.0* 20.5* 19.3*  INR 2.43 2.00 1.73 1.60  HEPARINUNFRC  --   --   --  0.14*  CREATININE 1.69* 1.69*  --  1.81*    Assessment: 70yo male subtherapeutic on heparin with initial dosing while Coumadin on hold; night RN reports that there have been no signs of bleeding during her shift.  Goal of Therapy:  Heparin level 0.3-0.5 units/ml   Plan:  Will increase heparin gtt by 2-3 units/kg/hr to 1200 units/hr and check level in [place], PharmD, BCPS  02/13/2017,1:50 AM

## 2017-02-13 NOTE — Progress Notes (Signed)
LVAD Coordinator rounding note:  Admitted 4/9/18due to GI bleed. 3 units of PRBC's to date.  HeartMate II LVAD implated on 2/9/18by Dr. Prescott Gum.    Vital signs: HR: 67 Doppler Pressure:94 Automatic BP: 97/72 O2 Sat: 100 on RA Wt: 82.6 kg   LVAD interrogation reveals:  Speed:9000 Flow: 6.1 Power: 6 PI: 4.6 Alarms: none Events: none Fixed speed: 9000 Low speed limit: 8400  Drive Line: Dressing due to be changed today per nursing. Next due 02/17/17.  Labs:  LDH trend:246>258>266  INR trend: 2.43>2.00>1.60  Anticoagulation Plan: -INR Goal: 2-2.5 (1.5 for possible GI procedures) -Heparin gtt started 4/11 -ASA Dose: 325 mg (on hold)  Adverse Events on VAD: -none  Plan/Recommendations:   1. Hopefully colon/enteroscopy on 4/13 per GI  Balinda Quails RN, [place] Coordinator 24/7 pager [phone]

## 2017-02-13 NOTE — Progress Notes (Signed)
INR is 1.6. Hgb 8.4 Set up for colonoscopy and Enteroscopy at 0900 tomorrow.   Split dose prep starts tonite.   Azucena Freed PA-C [phone].

## 2017-02-13 NOTE — Progress Notes (Signed)
HeartMate 2 Rounding Note  Subjective:    Admitted with symptomatic anemia. Admit Hgb 6.5. SO far he has received 3UPRBCs.   Hgb stable 8.4. Yesterday spiro increased and diuresed with IV lasix.   Weight down 5 pounds. Negative 1.7 liters.   Denies SOB. Frustrated wants to get GI procedure done.   LVAD INTERROGATION:  HeartMate II LVAD:  Flow 5.9 liters/min, speed 9000, power 6, PI 5.3   Objective:    Vital Signs:   Temp:  [97.4 F (36.3 C)-98.2 F (36.8 C)] 98.2 F (36.8 C) (04/11 2000) Pulse Rate:  [94-96] 96 (04/12 0400) Resp:  [20] 20 (04/12 0400) BP: (96-117)/(72-79) 97/72 (04/12 0400) SpO2:  [96 %-100 %] 100 % (04/12 0400) Weight:  [182 lb (82.6 kg)] 182 lb (82.6 kg) (04/12 0300) Last BM Date: 02/12/17 Mean arterial Pressure 90s      Intake/Output:   Intake/Output Summary (Last 24 hours) at 02/13/17 0729 Last data filed at 02/13/17 0400  Gross per 24 hour  Intake                0 ml  Output             1730 ml  Net            -1730 ml      Physical Exam: GENERAL: NAD. Sitting on the side of the bed.   HEENT: normal  NECK: Supple, JVP about 7-8 .  2+ bilaterally, no bruits.  No lymphadenopathy or thyromegaly appreciated.   CARDIAC:  Mechanical heart sounds with LVAD hum present.  LUNGS:  CTAB on room air.  ABDOMEN:  Soft, round, nontender, positive bowel sounds x4.     LVAD exit site: well-healed and incorporated.  Dressing dry and intact.  No erythema or drainage.  Stabilization device present and accurately applied.  Driveline dressing is being changed daily per sterile technique. EXTREMITIES:  Warm and dry, no cyanosis, clubbing, rash . LLE 1+ RLE trace edema.   NEUROLOGIC:  Alert and oriented x 4.  Gait steady.  No aphasia.  No dysarthria.  Affect pleasant.     Telemetry:  A fib 70-90s Personally reviewed.   Labs: Basic Metabolic Panel:  Recent Labs Lab 02/10/17 1416 02/11/17 0711 02/12/17 0238 02/13/17 0016  NA 137 137 137 136  K 3.7 2.9* 3.4*  4.3  CL 97* 99* 96* 97*  CO2 29 30 28 31   GLUCOSE 152* 198* 88 130*  BUN 61* 51* 46* 45*  CREATININE 1.93* 1.69* 1.69* 1.81*  CALCIUM 9.0 8.5* 8.8* 9.0    Liver Function Tests: No results for input(s): AST, ALT, ALKPHOS, BILITOT, PROT, ALBUMIN in the last 168 hours. No results for input(s): LIPASE, AMYLASE in the last 168 hours. No results for input(s): AMMONIA in the last 168 hours.  CBC:  Recent Labs Lab 02/10/17 1416 02/11/17 0711 02/12/17 0238 02/13/17 0016  WBC 10.2 8.9 9.6 9.8  NEUTROABS  --  6.6  --   --   HGB 6.5* 7.7* 8.3* 8.4*  HCT 20.1* 23.2* 25.5* 25.6*  MCV 97.6 93.5 92.4 93.4  PLT 132* 111* 121* 126*    INR:  Recent Labs Lab 02/10/17 1416 02/11/17 0711 02/12/17 0238 02/12/17 1356 02/13/17 0016  INR 2.51 2.43 2.00 1.73 1.60    Other results:  EKG:   Imaging: No results found.   Medications:     Scheduled Medications: . sodium chloride   Intravenous Once  . atorvastatin  40 mg Oral Daily  .  bisacodyl  5 mg Oral Q8H  . busPIRone  5 mg Oral BID  . docusate sodium  200 mg Oral Daily  . gabapentin  300 mg Oral BID  . insulin aspart  0-15 Units Subcutaneous TID WC  . levothyroxine  25 mcg Oral QAC breakfast  . multivitamin with minerals  1 tablet Oral Daily  . pantoprazole  40 mg Oral Q1200  . potassium chloride  40 mEq Oral BID  . sildenafil  20 mg Oral TID  . sodium chloride flush  3 mL Intravenous Q12H  . spironolactone  25 mg Oral Daily    Infusions: . heparin 1,200 Units/hr (02/13/17 0153)    PRN Medications: sodium chloride, acetaminophen, albuterol, ondansetron (ZOFRAN) IV, sodium chloride flush, traZODone   Assessment/Plan   1. Symptomatic Anemia- Hgb on admit 6.5 .  Overall received 3UPRBCs.  Todays Hgb 8.4   GI consult appreciated.Plan EGD/Colonoscopy tomorrow.  Todays INR is 1.6. Continue heparin.   Off asa and coumadin.  2. Acute/Chronic Systolic Heart Failure : ICM. HMII LVAD for DT. Medtronic ICD Hold aspirin  and coumadin.  INR 1.6 Continue heparin.  Volume status stable. Restart torsemide 60 mg daily. Continue spiro 25 mg daily  No bb with RV failure.  3. CKD Stage III- Creatinine up a little but ok. 1.8.    4. A FIb- Chronic- Rate controlled.  No coumadin with GI bleed.  5. HTN- Map 90s. Continue current regimen 6. 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 Continue sildenafil.  7. Hypokalemia - K.4.3 Continue current plan.    I reviewed the LVAD parameters from today, and compared the results to the patient's prior recorded data.  No programming changes were made.  The LVAD is functioning within specified parameters.  The patient performs LVAD self-test daily.  LVAD interrogation was negative for any significant power changes, alarms or PI events/speed drops.  LVAD equipment check completed and is in good working order.  Back-up equipment present.   LVAD education done on emergency procedures and precautions and reviewed exit site care.  Length of Stay: 3  Amy Clegg NP-C  02/13/2017, 7:29 AM  VAD Team --- VAD ISSUES ONLY--- Pager [phone] (7am - 7am)  Advanced Heart Failure Team  Pager [phone] (M-F; 7a - 4p)  Please contact [facility] for night-coverage after hours (4p -7a ) and weekends on [email or web address]  Patient seen with NP, agree with the above note.    Hemoglobin stable, INR 1.6.  Plan for enteroscopy and colonoscopy tomorrow.    Weight down 5 lbs with IV Lasix.  May resume torsemide 60 daily today.  Creatinine up mildly to 1.8.    MAP stable.  VAD parameters stable.  LDH stable  Loralie Champagne 02/13/2017

## 2017-02-14 ENCOUNTER — Encounter (HOSPITAL_COMMUNITY)
Admission: AD | Disposition: A | Discharge status: Home / Self Care | Payer: Self-pay | Source: Ambulatory Visit | Attending: Cardiology

## 2017-02-14 ENCOUNTER — Inpatient Hospital Stay (HOSPITAL_COMMUNITY): Payer: Medicare Other | Admitting: Anesthesiology

## 2017-02-14 ENCOUNTER — Encounter (HOSPITAL_COMMUNITY): Payer: Self-pay | Admitting: Anesthesiology

## 2017-02-14 DIAGNOSIS — K5521 Angiodysplasia of colon with hemorrhage: Secondary | ICD-10-CM

## 2017-02-14 HISTORY — PX: COLONOSCOPY: SHX5424

## 2017-02-14 LAB — CBC
HCT: 26.9 % — ABNORMAL LOW (ref 39.0–52.0)
Hemoglobin: 8.5 g/dL — ABNORMAL LOW (ref 13.0–17.0)
MCH: 30 pg (ref 26.0–34.0)
MCHC: 31.6 g/dL (ref 30.0–36.0)
MCV: 95.1 fL (ref 78.0–100.0)
PLATELETS: 123 10*3/uL — AB (ref 150–400)
RBC: 2.83 MIL/uL — AB (ref 4.22–5.81)
RDW: 21.8 % — AB (ref 11.5–15.5)
WBC: 9 10*3/uL (ref 4.0–10.5)

## 2017-02-14 LAB — GLUCOSE, CAPILLARY
GLUCOSE-CAPILLARY: 207 mg/dL — AB (ref 65–99)
GLUCOSE-CAPILLARY: 149 mg/dL — AB (ref 65–99)
Glucose-Capillary: 160 mg/dL — ABNORMAL HIGH (ref 65–99)
Glucose-Capillary: 150 mg/dL — ABNORMAL HIGH (ref 65–99)

## 2017-02-14 LAB — BASIC METABOLIC PANEL
Anion gap: 11 (ref 5–15)
BUN: 35 mg/dL — AB (ref 6–20)
CALCIUM: 9.3 mg/dL (ref 8.9–10.3)
CO2: 29 mmol/L (ref 22–32)
CREATININE: 1.65 mg/dL — AB (ref 0.61–1.24)
Chloride: 98 mmol/L — ABNORMAL LOW (ref 101–111)
GFR calc Af Amer: 47 mL/min — ABNORMAL LOW (ref >60)
GFR, EST NON AFRICAN AMERICAN: 40 mL/min — AB (ref >60)
GLUCOSE: 150 mg/dL — AB (ref 65–99)
Potassium: 4.1 mmol/L (ref 3.5–5.1)
SODIUM: 138 mmol/L (ref 135–145)

## 2017-02-14 LAB — HEPARIN LEVEL (UNFRACTIONATED)
HEPARIN UNFRACTIONATED: 0.24 [IU]/mL — AB (ref 0.30–0.70)
HEPARIN UNFRACTIONATED: 0.2 [IU]/mL — AB (ref 0.30–0.70)

## 2017-02-14 LAB — PROTIME-INR
INR: 1.32
Prothrombin Time: 16.5 seconds — ABNORMAL HIGH (ref 11.4–15.2)

## 2017-02-14 LAB — LACTATE DEHYDROGENASE: LDH: 267 U/L — ABNORMAL HIGH (ref 98–192)

## 2017-02-14 SURGERY — COLONOSCOPY
Anesthesia: Monitor Anesthesia Care

## 2017-02-14 MED ORDER — WARFARIN - PHARMACIST DOSING INPATIENT
Freq: Every day | Status: DC
  Administered 2017-02-14 – 2017-02-16 (×3)

## 2017-02-14 MED ORDER — GLUCAGON HCL RDNA (DIAGNOSTIC) 1 MG IJ SOLR
INTRAMUSCULAR | Status: AC
  Filled 2017-02-14: qty 1

## 2017-02-14 MED ORDER — LIDOCAINE HCL (CARDIAC) 20 MG/ML IV SOLN
INTRAVENOUS | Status: DC | PRN
  Administered 2017-02-14: 30 mg via INTRAVENOUS

## 2017-02-14 MED ORDER — HEPARIN (PORCINE) IN NACL 100-0.45 UNIT/ML-% IJ SOLN
1350.0000 [IU]/h | INTRAMUSCULAR | Status: DC
  Administered 2017-02-14: 1200 [IU]/h via INTRAVENOUS
  Administered 2017-02-15: 1350 [IU]/h via INTRAVENOUS
  Filled 2017-02-14 (×3): qty 250

## 2017-02-14 MED ORDER — ONDANSETRON HCL 4 MG/2ML IJ SOLN: 4.0000 mg | Freq: Once | INTRAMUSCULAR | Status: DC | PRN

## 2017-02-14 MED ORDER — SODIUM CHLORIDE 0.9 % IV SOLN
INTRAVENOUS | Status: DC
  Administered 2017-02-14: 09:00:00 via INTRAVENOUS

## 2017-02-14 MED ORDER — FENTANYL CITRATE (PF) 100 MCG/2ML IJ SOLN: 25.0000 ug | INTRAMUSCULAR | Status: DC | PRN

## 2017-02-14 MED ORDER — WARFARIN SODIUM 7.5 MG PO TABS
7.5000 mg | ORAL_TABLET | Freq: Once | ORAL | Status: AC
  Administered 2017-02-14: 7.5 mg via ORAL
  Filled 2017-02-14 (×2): qty 1

## 2017-02-14 MED ORDER — GLUCAGON HCL RDNA (DIAGNOSTIC) 1 MG IJ SOLR
INTRAMUSCULAR | Status: DC | PRN
  Administered 2017-02-14: .5 mg via INTRAVENOUS

## 2017-02-14 MED ORDER — DEXTROSE 5 % IV SOLN
0.0000 ug/min | INTRAVENOUS | Status: DC
  Filled 2017-02-14: qty 4

## 2017-02-14 MED ORDER — OXYCODONE HCL 5 MG PO TABS: 5.0000 mg | ORAL_TABLET | Freq: Once | ORAL | Status: DC | PRN

## 2017-02-14 MED ORDER — PROPOFOL 500 MG/50ML IV EMUL
INTRAVENOUS | Status: DC | PRN
  Administered 2017-02-14: 50 ug/kg/min via INTRAVENOUS

## 2017-02-14 MED ORDER — ALBUMIN HUMAN 5 % IV SOLN
INTRAVENOUS | Status: DC | PRN
  Administered 2017-02-14: 09:00:00 via INTRAVENOUS

## 2017-02-14 MED ORDER — AMLODIPINE BESYLATE 5 MG PO TABS
2.5000 mg | ORAL_TABLET | Freq: Every day | ORAL | Status: DC
  Administered 2017-02-15 – 2017-02-18 (×4): 2.5 mg via ORAL
  Filled 2017-02-14 (×5): qty 1

## 2017-02-14 MED ORDER — OXYCODONE HCL 5 MG/5ML PO SOLN: 5.0000 mg | Freq: Once | ORAL | Status: DC | PRN

## 2017-02-14 MED ORDER — TRAMADOL HCL 50 MG PO TABS
100.0000 mg | ORAL_TABLET | Freq: Four times a day (QID) | ORAL | Status: DC | PRN
  Administered 2017-02-14 – 2017-02-18 (×4): 100 mg via ORAL
  Filled 2017-02-14 (×4): qty 2

## 2017-02-14 MED ORDER — PHENYLEPHRINE HCL 10 MG/ML IJ SOLN
INTRAVENOUS | Status: DC | PRN
  Administered 2017-02-14: 20 ug/min via INTRAVENOUS

## 2017-02-14 NOTE — Progress Notes (Signed)
ANTICOAGULATION CONSULT NOTE -Follow up Pharmacy Consult for heparin while INR < 1.8, Coumadin Indication: atrial fibrillation/LVAD  No Known Allergies  Patient Measurements: Heparin Dosing Weight: 85 kg   Vital Signs: Temp: 98.1 F (36.7 C) (04/13 1100) Temp Source: Oral (04/13 1100) BP: 79/55 (04/13 1015) Pulse Rate: 41 (04/13 1100)  Labs:  Recent Labs  02/12/17 0238 02/12/17 1356  02/13/17 0016 02/13/17 1115 02/13/17 2005 02/14/17 0335 02/14/17 1832  HGB 8.3*  --   --  8.4*  --  8.8* 8.5*  --   HCT 25.5*  --   --  25.6*  --  26.7* 26.9*  --   PLT 121*  --   --  126*  --  140* 123*  --   LABPROT 23.0* 20.5*  --  19.3*  --   --  16.5*  --   INR 2.00 1.73  --  1.60  --   --  1.32  --   HEPARINUNFRC  --   --   < > 0.14* 0.33  --  0.24* 0.20*  CREATININE 1.69*  --   --  1.81*  --   --  1.65*  --   < > = values in this interval not displayed.  Estimated Creatinine Clearance: 40.5 mL/min (A) (by C-G formula based on SCr of 1.65 mg/dL (H)).  Assessment: 71 yo male on warfarin prior to admission for atrial fibrillation and LVAD admitted with symptomatic anemia.  Warfarin held in anticipation of GI procedures and heparin started 4/11.   Heparin was turned off this morning d/t dark BMs during bowel prep. Now s/p GI procedure and cleared to restart.  Heparin level this evening slightly below goal at 0.2 units/mL. No overt bleeding per RN (GI had told him to expect some dark stools post-procedure)  Goal of Therapy:  Heparin level 0.3-0.5 - GIB Monitor platelets by anticoagulation protocol: Yes   Plan:  Increase heparin to 1350 units/hr (~2unit/kg/hr increase) Check heparin level 6 hrs Daily heparin level and CBC  Josanne Boerema D. Faustino Luecke, PharmD, BCPS Clinical Pharmacist Pager: [phone] 02/14/2017 7:16 PM

## 2017-02-14 NOTE — Progress Notes (Signed)
VAD Coordinator Procedure Note:   Patient underwent colonoscopy per Dr Ardis Hughs. Hemodynamics and VAD parameters monitored by me throughout the procedure. Blood pressure was monitored with automatic cuff as the doppler was reflecting the MAP. All of his primary and back up equipment was present during the procedure as well as myself to assist with management.   Pre-procedure:  0900- Flow 5.3/ speed 9000/ PI 5.6/ Power 5.3/MAP 92  0910- 5.12/8998/6.0/5.2/72  0915- 5.03/8999/5.3/5.4/72  0920- 4.06/8939/6.2/5.1/89/MAP checked with doppler-98  0925- 5.02/8999/5.1/5.3/73  0930- 5.12/8998/5.2/5.2/80  0935- 5.11/8998/5.1/5.3/70  0945- 5.07/8999/5.4/5.6/81  8206- Procedure end. MAP-79    Sedation Induction:per CRNA/Endoscopy team  Patient tolerated the procedure well. VAD parameters and hemodynamics were stable throughout the case.   Melina Modena, his sister, to discuss findings.  Patient Disposition: 2W23  Balinda Quails, RN [place] Coordinator   Office: [phone] 24/7 [place] Pager: [phone]

## 2017-02-14 NOTE — Op Note (Signed)
[facility] Patient Name: Trevor [name] Procedure Date : 02/14/2017 MRN: [id number] Attending MD: Milus Banister , MD Date of Birth: Mar 22, 1946 CSN: [id number] Age: 71 Admit Type: Inpatient Procedure:                Colonoscopy Indications:              Hematochezia, Melena; on coumadin for LVAD                            placement 2 months ago Providers:                Milus Banister, MD, Zenon Mayo, RN, Cletis Athens, Technician Referring MD:              Medicines:                Monitored Anesthesia Care Complications:            No immediate complications. Estimated blood loss:                            None. Estimated Blood Loss:     Estimated blood loss: none. Procedure:                Pre-Anesthesia Assessment:                           - Prior to the procedure, a History and Physical                            was performed, and patient medications and                            allergies were reviewed. [name] tolerance of                            previous anesthesia was also reviewed. The risks                            and benefits of the procedure and the sedation                            options and risks were discussed with the patient.                            All questions were answered, and informed consent                            was obtained. Prior Anticoagulants: The patient has                            taken Coumadin (warfarin), last dose was 4 days                            prior to  procedure. ASA Grade Assessment: IV - A                            patient with severe systemic disease that is a                            constant threat to life. After reviewing the risks                            and benefits, the patient was deemed in                            satisfactory condition to undergo the procedure.                           After obtaining informed consent, the colonoscope                   was passed under direct vision. Throughout the                            procedure, the patient's blood pressure, pulse, and                            oxygen saturations were monitored continuously. The                            EC-[id number] ([id number]) scope was introduced through                            the anus and advanced to the the terminal ileum.                            The quality of the bowel preparation was good. The                            terminal ileum, ileocecal valve, appendiceal                            orifice, and rectum were photographed. Scope In: 9:14:48 AM Scope Out: 9:54:57 AM Total Procedure Duration: 0 hours 40 minutes 9 seconds  Findings:      There was fresh red blood, clots throughout the colon. There was no       blood in the terminal ileum. After extensive lavage and suctioning, one       small (pinpoint) angiodysplastic lesion with active bleeding was found       at the hepatic flexure. Coagulation for hemostasis using argon plasma       was successful. The bleeding stopped. I then placed two clips at the       site to bury the AVM within the mucosa, adding potential tamponade to       prevent future bleeding and also to serve as a maker in case bleeding       resumes.      Three sessile polyps were found in the sigmoid colon and descending  colon. The polyps were 2 to 4 mm in size. These were left in situ.      Multiple small-mouthed diverticula were found in the left colon.      The exam was otherwise without abnormality on direct and retroflexion       views.      The terminal ileum appeared normal. Impression:               - Fresh blood throughout the colon, no blood in the                            terminal ileum. Actively oozing pinpoint AVM                            located at the hepatic flexure; treated with APC                            and 2 clips.                           - Three 2 to 4 mm polyps in the  sigmoid colon and                            in the descending colon.                           - Diverticulosis in the left colon.                           - The examined portion of the ileum was normal.                           - The examination was otherwise normal. Moderate Sedation:      none Recommendation:           - Return patient to hospital ward for ongoing care.                           - Resume regular diet.                           - Continue present medications.                           - Please call if he has overt rebleeding. OK to                            resume his blood thinners however needed for his                            cardiac care. Procedure Code(s):        --- Professional ---                           [phone], Colonoscopy, flexible; with control of  bleeding, any method Diagnosis Code(s):        --- Professional ---                           K55.21, Angiodysplasia of colon with hemorrhage                           D12.5, Benign neoplasm of sigmoid colon                           D12.4, Benign neoplasm of descending colon                           K92.1, Melena (includes Hematochezia)                           K57.30, Diverticulosis of large intestine without                            perforation or abscess without bleeding CPT copyright 2016 American Medical Association. All rights reserved. The codes documented in this report are preliminary and upon coder review may  be revised to meet current compliance requirements. Milus Banister, MD 02/14/2017 10:09:30 AM This report has been signed electronically. Number of Addenda: 0

## 2017-02-14 NOTE — Anesthesia Postprocedure Evaluation (Addendum)
Anesthesia Post Note  Patient: Trevor [name]  Procedure(s) Performed: Procedure(s) (LRB): COLONOSCOPY (N/A)  Patient location during evaluation: Endoscopy Anesthesia Type: MAC Level of consciousness: awake, awake and alert and oriented Pain management: pain level controlled Vital Signs Assessment: post-procedure vital signs reviewed and stable Respiratory status: spontaneous breathing, nonlabored ventilation and respiratory function stable Cardiovascular status: blood pressure returned to baseline Postop Assessment: no headache Anesthetic complications: no       Last Vitals:  Vitals:   02/14/17 1015 02/14/17 1100  BP: (!) 79/55   Pulse: 79 (!) 41  Resp: 16   Temp: 36.6 C 36.7 C    Last Pain:  Vitals:   02/14/17 1100  TempSrc: Oral  PainSc:                  Sanders Manninen COKER

## 2017-02-14 NOTE — Transfer of Care (Signed)
Immediate Anesthesia Transfer of Care Note  Patient: Trevor [name]  Procedure(s) Performed: Procedure(s) with comments: COLONOSCOPY (N/A) ENTEROSCOPY (N/A) - this is a LVAD pt  Patient Location: Endoscopy Unit  Anesthesia Type:MAC  Level of Consciousness: awake, alert , oriented and patient cooperative  Airway & Oxygen Therapy: Patient Spontanous Breathing and Patient connected to nasal cannula oxygen  Post-op Assessment: Report given to RN and Post -op Vital signs reviewed and stable  Post vital signs: Reviewed and stable  Last Vitals:  Vitals:   02/14/17 0500 02/14/17 0842  BP: (!) 111/95 (!) 80/52  Pulse:  87  Resp:  (!) 21  Temp: 36.6 C 36.9 C    Last Pain:  Vitals:   02/14/17 0842  TempSrc: Oral  PainSc:          Complications: No apparent anesthesia complications

## 2017-02-14 NOTE — Progress Notes (Signed)
ANTICOAGULATION CONSULT NOTE -Follow up Pharmacy Consult for heparin when INR < 1.8 Indication: atrial fibrillation/LVAD  No Known Allergies  Patient Measurements: Heparin Dosing Weight: 85 kg   Vital Signs: Temp: 97.9 F (36.6 C) (04/13 0500) Temp Source: Oral (04/13 0500) BP: 111/95 (04/13 0500)  Labs:  Recent Labs  02/12/17 0238 02/12/17 1356 02/13/17 0016 02/13/17 1115 02/13/17 2005 02/14/17 0335  HGB 8.3*  --  8.4*  --  8.8* 8.5*  HCT 25.5*  --  25.6*  --  26.7* 26.9*  PLT 121*  --  126*  --  140* 123*  LABPROT 23.0* 20.5* 19.3*  --   --  16.5*  INR 2.00 1.73 1.60  --   --  1.32  HEPARINUNFRC  --   --  0.14* 0.33  --  0.24*  CREATININE 1.69*  --  1.81*  --   --  1.65*    Estimated Creatinine Clearance: 40.5 mL/min (A) (by C-G formula based on SCr of 1.65 mg/dL (H)).   Medical History: Past Medical History:  Diagnosis Date  . Asthma   . CHF (congestive heart failure) ([place])   . Diabetes ([place])   . Kidney disease   . Sleep apnea    Assessment: 71 yo male on warfarin prior to admission for atrial fibrillation and LVAD admitted with symptomatic anemia.  Warfarin held in anticipation of GI procedures.  Heparin started 4/11.  Noted patient had 3 dark red bowel movements with GI prep overnight and heparin gtt was turned off at 255 AM.  Heparin level slightly low, but heparin had been off for ~ 30 min at time of blood draw.  INR down to 1.32  Planning enteroscopy today.    Goal of Therapy:  Heparin level 0.3-0.5 - GIB Monitor platelets by anticoagulation protocol: Yes   Plan:  F/u plans to resume anticoagulation after GI procedures today.  Uvaldo Rising, BCPS  Clinical Pharmacist Pager [phone]  02/14/2017 7:12 AM

## 2017-02-14 NOTE — H&P (View-Only) (Signed)
          Daily Rounding Note  02/12/2017, 9:19 AM  LOS: 2 days   SUBJECTIVE:   Chief complaint: fatigue, DOE: improved after transfusion.  Walking in halls easily.   No BM during day yesterday, 2 small overnite, 2 larger this AM: formed, black.   OBJECTIVE:         Vital signs in last 24 hours:    Temp:  [97.6 F (36.4 C)-98.1 F (36.7 C)] 97.6 F (36.4 C) (04/11 0400) Pulse Rate:  [67-89] 75 (04/11 0400) Resp:  [18] 18 (04/10 1440) BP: (91-101)/(70-78) 91/75 (04/11 0400) SpO2:  [96 %-99 %] 97 % (04/11 0400) Weight:  [84.8 kg (187 lb)] 84.8 kg (187 lb) (04/11 0400) Last BM Date: 02/11/17 Filed Weights   02/10/17 1611 02/11/17 0353 02/12/17 0400  Weight: 85 kg (187 lb 4.8 oz) 84.6 kg (186 lb 6.4 oz) 84.8 kg (187 lb)   General: pleasant, NAD   Heart: VAD hum with soft underlying beats audible  Chest: clear bil.  Overall reduced BS.  No dyspnea or cough Abdomen: soft, NT, active BS.  VAD driveline site bandaged.   Extremities: no CCE Neuro/Psych:  Pleasant, calm, fully alert and oriented.   Intake/Output from previous day: 04/10 0701 - 04/11 0700 In: -  Out: 1500 [Urine:1500]  Intake/Output this shift: No intake/output data recorded.  Lab Results:  Recent Labs  02/10/17 1416 02/11/17 0711 02/12/17 0238  WBC 10.2 8.9 9.6  HGB 6.5* 7.7* 8.3*  HCT 20.1* 23.2* 25.5*  PLT 132* 111* 121*   BMET  Recent Labs  02/10/17 1416 02/11/17 0711 02/12/17 0238  NA 137 137 137  K 3.7 2.9* 3.4*  CL 97* 99* 96*  CO2 29 30 28   GLUCOSE 152* 198* 88  BUN 61* 51* 46*  CREATININE 1.93* 1.69* 1.69*  CALCIUM 9.0 8.5* 8.8*   LFT No results for input(s): PROT, ALBUMIN, AST, ALT, ALKPHOS, BILITOT, BILIDIR, IBILI in the last 72 hours. PT/INR  Recent Labs  02/11/17 0711 02/12/17 0238  LABPROT 26.9* 23.0*  INR 2.43 2.00   Hepatitis Panel No results for input(s): HEPBSAG, HCVAB, HEPAIGM, HEPBIGM in the last 72  hours.  Studies/Results: No results found.  ASSESMENT:   *  GI bleed.  Likely upper.  Suspect AVMs   *  Blood loss anemia on top of chronic anemia. s/p PRBCs x 3 with slightly less than expected response (Hgb up just shy of 2 grams, expected would be 3 gm rise).   .    *  Chronic Coumadin.  INR remains in therapeutic range.  INR needs to be 1.5 or less for GI procedures.    *  Noncritical thrombocytopenia. This was present intermittently during his February admission as well.  *  Hypokalemia, improved but not resolved.   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     PLAN   *  Hopefully colon/enteroscopy on 4/13.    Azucena Freed  02/12/2017, 9:19 AM Pager: [phone]  ________________________________________________________________________  Velora Heckler GI MD note:  I reviewed the data above and agree with the assessment and plan described.   Owens Loffler, MD [facility] Gastroenter[facility] Pager [phone]

## 2017-02-14 NOTE — Progress Notes (Signed)
ANTICOAGULATION CONSULT NOTE -Follow up Pharmacy Consult for heparin while INR < 1.8, Coumadin Indication: atrial fibrillation/LVAD  No Known Allergies  Patient Measurements: Heparin Dosing Weight: 85 kg   Vital Signs: Temp: 97.9 F (36.6 C) (04/13 1015) Temp Source: Oral (04/13 1015) BP: 79/55 (04/13 1015) Pulse Rate: 79 (04/13 1015)  Labs:  Recent Labs  02/12/17 0238 02/12/17 1356 02/13/17 0016 02/13/17 1115 02/13/17 2005 02/14/17 0335  HGB 8.3*  --  8.4*  --  8.8* 8.5*  HCT 25.5*  --  25.6*  --  26.7* 26.9*  PLT 121*  --  126*  --  140* 123*  LABPROT 23.0* 20.5* 19.3*  --   --  16.5*  INR 2.00 1.73 1.60  --   --  1.32  HEPARINUNFRC  --   --  0.14* 0.33  --  0.24*  CREATININE 1.69*  --  1.81*  --   --  1.65*    Estimated Creatinine Clearance: 40.5 mL/min (A) (by C-G formula based on SCr of 1.65 mg/dL (H)).   Medical History: Past Medical History:  Diagnosis Date  . AICD (automatic cardioverter/defibrillator) present   . Asthma   . CHF (congestive heart failure) ([place])   . Diabetes ([place])   . Kidney disease   . Presence of permanent cardiac pacemaker    AICD  . Sleep apnea    Assessment: 71 yo male on warfarin prior to admission for atrial fibrillation and LVAD admitted with symptomatic anemia.  Warfarin held in anticipation of GI procedures.  Heparin started 4/11.  Noted patient had 3 dark red bowel movements with GI prep overnight and heparin gtt was turned off at 255 AM.  Heparin level slightly low, but heparin had been off for ~ 30 min at time of blood draw.  INR down to 1.32  Now s/p GI procedure, okay to resume anticoagulation per their note.     Goal of Therapy:  Heparin level 0.3-0.5 - GIB Monitor platelets by anticoagulation protocol: Yes   Plan:  Restart IV heparin 1200 units/hr. Check heparin level 6 hrs after gtt starts. Daily heparin level and CBC. Resume Coumadin 7.5 mg x 1. Daily INR.  Uvaldo Rising, BCPS  Clinical  Pharmacist Pager [phone]  02/14/2017 10:35 AM

## 2017-02-14 NOTE — Anesthesia Preprocedure Evaluation (Addendum)
Anesthesia Evaluation  Patient identified by MRN, date of birth, ID band Patient awake    Reviewed: Allergy & Precautions, NPO status , Patient's Chart, lab work & pertinent test results  Airway Mallampati: II       Dental  (+) Teeth Intact, Dental Advisory Given   Pulmonary    breath sounds clear to auscultation       Cardiovascular  Rhythm:Regular Rate:Normal - Systolic murmurs    Neuro/Psych    GI/Hepatic   Endo/Other  diabetes  Renal/GU      Musculoskeletal   Abdominal   Peds  Hematology   Anesthesia Other Findings   Reproductive/Obstetrics                            Anesthesia Physical Anesthesia Plan  ASA: III  Anesthesia Plan: MAC   Post-op Pain Management:    Induction: Intravenous  Airway Management Planned: Simple Face Mask and Natural Airway  Additional Equipment:   Intra-op Plan:   Post-operative Plan:   Informed Consent: I have reviewed the patients History and Physical, chart, labs and discussed the procedure including the risks, benefits and alternatives for the proposed anesthesia with the patient or authorized representative who has indicated his/her understanding and acceptance.     Plan Discussed with: CRNA and Anesthesiologist  Anesthesia Plan Comments:         Anesthesia Quick Evaluation

## 2017-02-14 NOTE — Progress Notes (Signed)
LVAD Coordinator rounding note:  Admitted 4/9/18due to GI bleed. 3 units of PRBC's to date.  HeartMate II LVAD implated on 2/9/18by Dr. Prescott Gum.    Colonoscopy 4/13> AVM in colon> clips x2   Vital signs: HR: 79 Doppler Pressure:96 Automatic BP: 111/95 O2 Sat: 99 Wt:81.9 kg   LVAD interrogation reveals:  Speed:9000 Flow: 5.6 Power: 6 PI: 6.1 Alarms: none Events: none Fixed speed: 9000 Low speed limit: 8400  Drive Line: Dressing changed 4/13 personally using sterile technique per protocol. Due again Monday 02/17/17. Amy Clegg NP present during dressing change to observe site. Large yellow crusted scab noted.  Plan to monitor until scab falls off. No concern for infection at this time.     Will monitor closely.  Labs:  LDH trend:246>258>267  INR trend: 2.43>2.00>1.32  Anticoagulation Plan: -INR Goal: 2-2.5 (restarted coumadin 4/13) -ASA Dose: 325 mg (on hold)  Adverse Events on VAD: -GIB with AVM  Plan/Recommendations:   1. Resume Heparin and Coumadin per Dr Aundra Dubin. 2. Arrange for outpatient Octreotide injections. 3. Monitor drive line exit site closely.  Balinda Quails RN, VAD Coordinator 24/7 pager [phone]

## 2017-02-14 NOTE — Progress Notes (Signed)
Patient ID: Trevor [name], male   DOB: 03/03/1946, 71 y.o.   MRN: [id number] HeartMate 2 Rounding Note  Subjective:    Admitted with symptomatic anemia. Admit Hgb 6.5. So far he has received 3U PRBCs.   Hgb stable 8.5. Had some maroon stool with bowel prep last night.  Plan for enteroscopy/colonoscopy today, INR 1.3 on heparin gtt.   Weight down another 2 lbs.  Back on home torsemide.  MAP a bit high in 90s at times.   LVAD INTERROGATION:  HeartMate II LVAD:  Flow 6.3 liters/min, speed 9000, power 5.9, PI 4.5, 1 PI event   Objective:    Vital Signs:   Temp:  [97.5 F (36.4 C)-97.9 F (36.6 C)] 97.9 F (36.6 C) (04/13 0500) Pulse Rate:  [83] 83 (04/12 1324) Resp:  [18] 18 (04/13 0000) BP: (87-111)/(58-95) 111/95 (04/13 0500) SpO2:  [97 %-100 %] 97 % (04/13 0500) Weight:  [180 lb 9.6 oz (81.9 kg)] 180 lb 9.6 oz (81.9 kg) (04/13 0500) Last BM Date: 02/13/17 Mean arterial Pressure 90s    Intake/Output:   Intake/Output Summary (Last 24 hours) at 02/14/17 0752 Last data filed at 02/13/17 1750  [place] per 24 hour  Intake              606 ml  Output              850 ml  Net             -244 ml    Physical Exam: GENERAL: NAD, lying in bed.   HEENT: normal  NECK: Supple, JVP about 8.  2+ bilaterally, no bruits.  No lymphadenopathy or thyromegaly appreciated.   CARDIAC:  Mechanical heart sounds with LVAD hum present.  LUNGS:  CTAB on room air.  ABDOMEN:  Soft, round, nontender, positive bowel sounds x4.     LVAD exit site: well-healed and incorporated.  Dressing dry and intact.  No erythema or drainage.  Stabilization device present and accurately applied.  Driveline dressing is being changed daily per sterile technique. EXTREMITIES:  Warm and dry, no cyanosis, clubbing, rash. 1+ edema 1/2 to knees bilaterally.  NEUROLOGIC:  Alert and oriented x 4.  Gait steady.  No aphasia.  No dysarthria.  Affect pleasant.     Telemetry:  A fib 80s Personally reviewed.   Labs: Basic  Metabolic Panel:  Recent Labs Lab 02/10/17 1416 02/11/17 0711 02/12/17 0238 02/13/17 0016 02/14/17 0335  NA 137 137 137 136 138  K 3.7 2.9* 3.4* 4.3 4.1  CL 97* 99* 96* 97* 98*  CO2 29 30 28 31 29   GLUCOSE 152* 198* 88 130* 150*  BUN 61* 51* 46* 45* 35*  CREATININE 1.93* 1.69* 1.69* 1.81* 1.65*  CALCIUM 9.0 8.5* 8.8* 9.0 9.3    Liver Function Tests: No results for input(s): AST, ALT, ALKPHOS, BILITOT, PROT, ALBUMIN in the last 168 hours. No results for input(s): LIPASE, AMYLASE in the last 168 hours. No results for input(s): AMMONIA in the last 168 hours.  CBC:  Recent Labs Lab 02/11/17 0711 02/12/17 0238 02/13/17 0016 02/13/17 2005 02/14/17 0335  WBC 8.9 9.6 9.8 9.7 9.0  NEUTROABS 6.6  --   --  6.6  --   HGB 7.7* 8.3* 8.4* 8.8* 8.5*  HCT 23.2* 25.5* 25.6* 26.7* 26.9*  MCV 93.5 92.4 93.4 94.0 95.1  PLT 111* 121* 126* 140* 123*    INR:  Recent Labs Lab 02/11/17 0711 02/12/17 0238 02/12/17 1356 02/13/17 0016 02/14/17 0335  INR 2.43 2.00 1.73 1.60 1.32    Other results:  EKG:   Imaging: Dg Chest 2 View  Result Date: 02/13/2017 CLINICAL DATA:  71 year old male with LVAD. EXAM: CHEST  2 VIEW COMPARISON:  12/30/2016 and earlier. FINDINGS: PA and lateral views of the chest. Improved lung volumes and bibasilar ventilation since February. Stable cardiomegaly, prior CABG, cardiac valve replacement, right chest AICD, and stable radiopaque LVAD components. Other mediastinal contours are stable. No pneumothorax, pulmonary edema or pleural effusion. Curvilinear atelectasis about the left hilum. No consolidation. Mild kyphoscoliosis. Osteopenia. No acute osseous abnormality identified. Negative visible bowel gas pattern. IMPRESSION: 1. No acute cardiopulmonary abnormality aside from mild atelectasis. 2. Stable LVAD configuration. Electronically Signed   By: Genevie Ann M.D.   On: 02/13/2017 16:58     Medications:     Scheduled Medications: . sodium chloride    Intravenous Once  . amLODipine  2.5 mg Oral Daily  . atorvastatin  40 mg Oral Daily  . busPIRone  5 mg Oral BID  . docusate sodium  200 mg Oral Daily  . gabapentin  300 mg Oral BID  . insulin aspart  0-15 Units Subcutaneous TID WC  . levothyroxine  25 mcg Oral QAC breakfast  . multivitamin with minerals  1 tablet Oral Daily  . pantoprazole  40 mg Oral Q1200  . potassium chloride  40 mEq Oral BID  . sildenafil  20 mg Oral TID  . sodium chloride flush  3 mL Intravenous Q12H  . spironolactone  25 mg Oral Daily  . torsemide  20 mg Oral TID WC    Infusions:   PRN Medications: sodium chloride, acetaminophen, albuterol, ondansetron (ZOFRAN) IV, sodium chloride flush, traZODone   Assessment/Plan   1. GI bleeding: Has had 3 units PRBCs this admission.  Hemoglobin stable overnight.  INR 1.3, on heparin gtt.  Off warfarin and ASA.   - Enteroscopy/colonoscopy today.   - If AVMs found, would start on octreotide as outpatient.   2. Acute on chronic systolic CHF: Ischemic cardiomyopathy with RV failure. HMII LVAD for DT. [employer] ICD.  Volume looks better, weight down another 2 lbs.  - Continue torsemide 60 mg daily.  - Continue spironolactone 25 mg daily  - No beta blocker with RV failure, no ACEI/ARB/ARNI with elevated creatinine.  3. CKD Stage III: Creatinine down today to 1.65.   4. Atrial fibrillation: Chronic. Rate controlled.  Coumadin on hold currently with GI bleed, on heparin gtt for coverage.  5. HTN: MAP 90s, restart home amlodipine.  6. 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 Continue sildenafil.   I reviewed the LVAD parameters from today, and compared the results to the patient's prior recorded data.  No programming changes were made.  The LVAD is functioning within specified parameters.  The patient performs LVAD self-test daily.  LVAD interrogation was negative for any significant power changes, alarms or PI events/speed drops.  LVAD equipment check completed and is in good working order.  Back-up  equipment present.   LVAD education done on emergency procedures and precautions and reviewed exit site care.  Length of Stay: [place]  02/14/2017, 7:52 AM  VAD Team --- VAD ISSUES ONLY--- Pager [phone] (7am - 7am)  Advanced Heart Failure Team  Pager [phone] (M-F; 7a - 4p)  Please contact [facility] for night-coverage after hours (4p -7a ) and weekends on [email or web address]

## 2017-02-14 NOTE — Interval H&P Note (Signed)
History and Physical Interval Note:  02/14/2017 8:52 AM  Trevor [name]  has presented today for surgery, with the diagnosis of gi bleed (black FOBT + stools) and anemia in LVAD pt on Coumadin  The various methods of treatment have been discussed with the patient and family. After consideration of risks, benefits and other options for treatment, the patient has consented to  Procedure(s) with comments: COLONOSCOPY (N/A) ENTEROSCOPY (N/A) - this is a LVAD pt as a surgical intervention .  [name] history has been reviewed, patient examined, no change in status, stable for surgery.  I have reviewed the patient's chart and labs.  Questions were answered to the patient's satisfaction.     Milus Banister

## 2017-02-14 NOTE — Anesthesia Procedure Notes (Signed)
Procedure Name: MAC Date/Time: 02/14/2017 9:05 AM Performed by: Laretta Alstrom Pre-anesthesia Checklist: Patient identified, Emergency Drugs available, Suction available, Patient being monitored and Timeout performed Patient Re-evaluated:Patient Re-evaluated prior to inductionOxygen Delivery Method: Nasal cannula Intubation Type: IV induction Placement Confirmation: CO2 detector,  positive ETCO2 and breath sounds checked- equal and bilateral Dental Injury: Teeth and Oropharynx as per pre-operative assessment

## 2017-02-15 DIAGNOSIS — Q2733 Arteriovenous malformation of digestive system vessel: Secondary | ICD-10-CM

## 2017-02-15 LAB — HEMOGLOBIN AND HEMATOCRIT, BLOOD
HCT: 26.4 % — ABNORMAL LOW (ref 39.0–52.0)
HEMOGLOBIN: 8.7 g/dL — AB (ref 13.0–17.0)

## 2017-02-15 LAB — CBC
HCT: 24.6 % — ABNORMAL LOW (ref 39.0–52.0)
Hemoglobin: 7.7 g/dL — ABNORMAL LOW (ref 13.0–17.0)
MCH: 30.1 pg (ref 26.0–34.0)
MCHC: 31.3 g/dL (ref 30.0–36.0)
MCV: 96.1 fL (ref 78.0–100.0)
Platelets: 103 10*3/uL — ABNORMAL LOW (ref 150–400)
RBC: 2.56 MIL/uL — AB (ref 4.22–5.81)
RDW: 21.2 % — ABNORMAL HIGH (ref 11.5–15.5)
WBC: 8.6 10*3/uL (ref 4.0–10.5)

## 2017-02-15 LAB — BASIC METABOLIC PANEL
Anion gap: 7 (ref 5–15)
BUN: 30 mg/dL — AB (ref 6–20)
CO2: 30 mmol/L (ref 22–32)
CREATININE: 1.62 mg/dL — AB (ref 0.61–1.24)
Calcium: 8.9 mg/dL (ref 8.9–10.3)
Chloride: 101 mmol/L (ref 101–111)
GFR calc Af Amer: 48 mL/min — ABNORMAL LOW (ref >60)
GFR calc non Af Amer: 41 mL/min — ABNORMAL LOW (ref >60)
GLUCOSE: 153 mg/dL — AB (ref 65–99)
Potassium: 4.4 mmol/L (ref 3.5–5.1)
Sodium: 138 mmol/L (ref 135–145)

## 2017-02-15 LAB — PREPARE RBC (CROSSMATCH)

## 2017-02-15 LAB — HEPARIN LEVEL (UNFRACTIONATED)
HEPARIN UNFRACTIONATED: 0.37 [IU]/mL (ref 0.30–0.70)
Heparin Unfractionated: 0.47 IU/mL (ref 0.30–0.70)
Heparin Unfractionated: 0.59 IU/mL (ref 0.30–0.70)

## 2017-02-15 LAB — PROTIME-INR
INR: 1.45
PROTHROMBIN TIME: 17.8 s — AB (ref 11.4–15.2)

## 2017-02-15 LAB — GLUCOSE, CAPILLARY
GLUCOSE-CAPILLARY: 142 mg/dL — AB (ref 65–99)
GLUCOSE-CAPILLARY: 197 mg/dL — AB (ref 65–99)
Glucose-Capillary: 123 mg/dL — ABNORMAL HIGH (ref 65–99)
Glucose-Capillary: 186 mg/dL — ABNORMAL HIGH (ref 65–99)

## 2017-02-15 LAB — LACTATE DEHYDROGENASE: LDH: 231 U/L — AB (ref 98–192)

## 2017-02-15 MED ORDER — WARFARIN SODIUM 7.5 MG PO TABS
7.5000 mg | ORAL_TABLET | Freq: Once | ORAL | Status: AC
  Administered 2017-02-15: 7.5 mg via ORAL
  Filled 2017-02-15: qty 1

## 2017-02-15 MED ORDER — TORSEMIDE 20 MG PO TABS
20.0000 mg | ORAL_TABLET | Freq: Three times a day (TID) | ORAL | Status: AC
  Administered 2017-02-15: 20 mg via ORAL
  Filled 2017-02-15: qty 1

## 2017-02-15 MED ORDER — HEPARIN (PORCINE) IN NACL 100-0.45 UNIT/ML-% IJ SOLN
1450.0000 [IU]/h | INTRAMUSCULAR | Status: DC
  Administered 2017-02-16: 1250 [IU]/h via INTRAVENOUS
  Administered 2017-02-17: 1450 [IU]/h via INTRAVENOUS
  Administered 2017-02-17: 1250 [IU]/h via INTRAVENOUS
  Administered 2017-02-18 – 2017-02-20 (×3): 1450 [IU]/h via INTRAVENOUS
  Filled 2017-02-15 (×6): qty 250

## 2017-02-15 MED ORDER — TORSEMIDE 20 MG PO TABS
60.0000 mg | ORAL_TABLET | Freq: Every day | ORAL | Status: DC
  Administered 2017-02-16 – 2017-02-20 (×5): 60 mg via ORAL
  Filled 2017-02-15 (×5): qty 3

## 2017-02-15 MED ORDER — FUROSEMIDE 10 MG/ML IJ SOLN
80.0000 mg | Freq: Once | INTRAMUSCULAR | Status: AC
  Administered 2017-02-15: 80 mg via INTRAVENOUS
  Filled 2017-02-15: qty 8

## 2017-02-15 MED ORDER — SODIUM CHLORIDE 0.9 % IV SOLN
Freq: Once | INTRAVENOUS | Status: AC
  Administered 2017-02-15: 13:00:00 via INTRAVENOUS

## 2017-02-15 NOTE — Progress Notes (Signed)
ANTICOAGULATION CONSULT NOTE -Follow up Pharmacy Consult for heparin while INR < 1.8, Coumadin Indication: atrial fibrillation/LVAD  No Known Allergies  Patient Measurements: Heparin Dosing Weight: 85 kg   Vital Signs: Temp: 97.7 F (36.5 C) (04/14 0457) Temp Source: Oral (04/14 0457) BP: 93/59 (04/14 0800) Pulse Rate: 76 (04/14 0457)  Labs:  Recent Labs  02/13/17 0016  02/13/17 2005 02/14/17 0335 02/14/17 1832 02/15/17 0205 02/15/17 0206 02/15/17 0938  HGB 8.4*  --  8.8* 8.5*  --   --  7.7*  --   HCT 25.6*  --  26.7* 26.9*  --   --  24.6*  --   PLT 126*  --  140* 123*  --   --  103*  --   LABPROT 19.3*  --   --  16.5*  --   --  17.8*  --   INR 1.60  --   --  1.32  --   --  1.45  --   HEPARINUNFRC 0.14*  < >  --  0.24* 0.20* 0.47  --  0.59  CREATININE 1.81*  --   --  1.65*  --   --  1.62*  --   < > = values in this interval not displayed.  Estimated Creatinine Clearance: 40.9 mL/min (A) (by C-G formula based on SCr of 1.62 mg/dL (H)).   Medical History: Past Medical History:  Diagnosis Date  . AICD (automatic cardioverter/defibrillator) present   . Asthma   . CHF (congestive heart failure) ([place])   . Diabetes ([place])   . Kidney disease   . Presence of permanent cardiac pacemaker    AICD  . Sleep apnea    Assessment: 71 yo male on warfarin prior to admission for atrial fibrillation and LVAD admitted with symptomatic anemia.  Warfarin held in anticipation of GI procedures.  Heparin started 4/11.  Noted patient had 3 dark red bowel movements with GI prep overnight and heparin gtt was turned off at 255 AM.  Heparin resumed after GI intervention 4/13.  INR 1.45 today, heparin level at goal at 0.59.  Hgb continues to drift down.  No overt bleeding, getting more PRBCs today.  Continuing heparin and Coumadin. .     Goal of Therapy:  Heparin level 0.3-0.5 - GIB Monitor platelets by anticoagulation protocol: Yes   Plan:  Reduce IV heparin to 1250 units/hr. Check  heparin level 8 hrs after rate changed. Daily heparin level and CBC. Repeat Coumadin 7.5 mg x 1. Daily INR.  Uvaldo Rising, BCPS  Clinical Pharmacist Pager [phone]  02/15/2017 12:22 PM

## 2017-02-15 NOTE — Progress Notes (Addendum)
ANTICOAGULATION CONSULT NOTE - Follow Up Consult  Pharmacy Consult for Heparin (while INR is <1.8) Indication: Afib/LVAD  No Known Allergies  Patient Measurements: Height: 5\' 5"  (165.1 cm) Weight: 180 lb 9.6 oz (81.9 kg) IBW/kg (Calculated) : 61.5  Vital Signs: Temp: 98.9 F (37.2 C) (04/13 2000) Temp Source: Oral (04/13 2000) BP: 85/41 (04/13 2000) Pulse Rate: 80 (04/13 2000)  Labs:  Recent Labs  02/13/17 0016  02/13/17 2005 02/14/17 0335 02/14/17 1832 02/15/17 0205 02/15/17 0206  HGB 8.4*  --  8.8* 8.5*  --   --  7.7*  HCT 25.6*  --  26.7* 26.9*  --   --  24.6*  PLT 126*  --  140* 123*  --   --  103*  LABPROT 19.3*  --   --  16.5*  --   --  17.8*  INR 1.60  --   --  1.32  --   --  1.45  HEPARINUNFRC 0.14*  < >  --  0.24* 0.20* 0.47  --   CREATININE 1.81*  --   --  1.65*  --   --  1.62*  < > = values in this interval not displayed.  Estimated Creatinine Clearance: 41.2 mL/min (A) (by C-G formula based on SCr of 1.62 mg/dL (H)).  Assessment: On heparin drip while INR is sub-therapeutic (<1.8). Warfarin has been re-started. INR 1.45 this AM. Continue heparin. Heparin level therapeutic x 1 after rate increase.   Goal of Therapy:  Heparin level 0.3-0.7 units/ml Monitor platelets by anticoagulation protocol: Yes   Plan:  -Cont heparin at 1350 units/hr -1000 HL -Trend Hgb  Narda Bonds 02/15/2017,3:06 AM

## 2017-02-15 NOTE — Progress Notes (Signed)
Patient ID: Trevor [name], male   DOB: [birth year], 71 y.o.   MRN: [id number] HeartMate 2 Rounding Note  Subjective:    Admitted with symptomatic anemia. Admit Hgb 6.5. So far he has received 3U PRBCs.   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   Hemoglobin 7.7 today.  No overt bleeding since colonoscopy.  Heparin gtt + warfarin restarted, INR 1.45.   Weight down another 2 lbs.  Back on home torsemide.  MAP 80s  LVAD INTERROGATION:  HeartMate II LVAD:  Flow 6.7 liters/min, speed 9000, power 6.1, PI 4.1, no PI events  Objective:    Vital Signs:   Temp:  [97.7 F (36.5 C)-98.9 F (37.2 C)] 97.7 F (36.5 C) (04/14 0457) Pulse Rate:  [41-80] 76 (04/14 0457) Resp:  [16] 16 (04/13 2100) BP: (79-97)/(41-72) 93/59 (04/14 0800) SpO2:  [98 %-100 %] 98 % (04/14 0457) Weight:  [178 lb 1.6 oz (80.8 kg)] 178 lb 1.6 oz (80.8 kg) (04/14 0456) Last BM Date: 02/14/17 Mean arterial Pressure 80s    Intake/Output:   Intake/Output Summary (Last 24 hours) at 02/15/17 0920 Last data filed at 02/15/17 0431  Gross per 24 hour  Intake          1415.58 ml  Output               40 ml  Net          1375.58 ml    Physical Exam: GENERAL: NAD, sitting in bed.   HEENT: normal  NECK: Supple, JVP 8-9.  2+ bilaterally, no bruits.  No lymphadenopathy or thyromegaly appreciated.   CARDIAC:  Mechanical heart sounds with LVAD hum present.  LUNGS:  CTAB on room air.  ABDOMEN:  Soft, round, nontender, positive bowel sounds x4.     LVAD exit site: well-healed and incorporated.  Dressing dry and intact.  No erythema or drainage.  Stabilization device present and accurately applied.  Driveline dressing is being changed daily per sterile technique. EXTREMITIES:  Warm and dry, no cyanosis, clubbing, rash. 1+ edema 1/3 to knees bilaterally. Ted hose on.  NEUROLOGIC:  Alert and oriented x 4.  Gait steady.  No aphasia.  No dysarthria.  Affect pleasant.      Telemetry:  A fib 80s Personally reviewed.   Labs: Basic Metabolic Panel:  Recent Labs Lab 02/11/17 0711 02/12/17 0238 02/13/17 0016 02/14/17 0335 02/15/17 0206  NA 137 137 136 138 138  K 2.9* 3.4* 4.3 4.1 4.4  CL 99* 96* 97* 98* 101  CO2 30 28 31 29 30   GLUCOSE 198* 88 130* 150* 153*  BUN 51* 46* 45* 35* 30*  CREATININE 1.69* 1.69* 1.81* 1.65* 1.62*  CALCIUM 8.5* 8.8* 9.0 9.3 8.9    Liver Function Tests: No results for input(s): AST, ALT, ALKPHOS, BILITOT, PROT, ALBUMIN in the last 168 hours. No results for input(s): LIPASE, AMYLASE in the last 168 hours. No results for input(s): AMMONIA in the last 168 hours.  CBC:  Recent Labs Lab 02/11/17 0711 02/12/17 0238 02/13/17 0016 02/13/17 2005 02/14/17 0335 02/15/17 0206  WBC 8.9 9.6 9.8 9.7 9.0 8.6  NEUTROABS 6.6  --   --  6.6  --   --   HGB 7.7* 8.3* 8.4* 8.8* 8.5* 7.7*  HCT 23.2* 25.5* 25.6* 26.7* 26.9* 24.6*  MCV 93.5 92.4 93.4 94.0 95.1 96.1  PLT 111* 121* 126* 140* 123* 103*    INR:  Recent Labs Lab  02/12/17 0238 02/12/17 1356 02/13/17 0016 02/14/17 0335 02/15/17 0206  INR 2.00 1.73 1.60 1.32 1.45    Other results:  EKG:   Imaging: Dg Chest 2 View  Result Date: 02/13/2017 CLINICAL DATA:  71 year old male with LVAD. EXAM: CHEST  2 VIEW COMPARISON:  12/30/2016 and earlier. FINDINGS: PA and lateral views of the chest. Improved lung volumes and bibasilar ventilation since February. Stable cardiomegaly, prior CABG, cardiac valve replacement, right chest AICD, and stable radiopaque LVAD components. Other mediastinal contours are stable. No pneumothorax, pulmonary edema or pleural effusion. Curvilinear atelectasis about the left hilum. No consolidation. Mild kyphoscoliosis. Osteopenia. No acute osseous abnormality identified. Negative visible bowel gas pattern. IMPRESSION: 1. No acute cardiopulmonary abnormality aside from mild atelectasis. 2. Stable LVAD configuration. Electronically Signed   By: Genevie Ann  M.D.   On: 02/13/2017 16:58     Medications:     Scheduled Medications: . sodium chloride   Intravenous Once  . sodium chloride   Intravenous Once  . amLODipine  2.5 mg Oral Daily  . atorvastatin  40 mg Oral Daily  . busPIRone  5 mg Oral BID  . docusate sodium  200 mg Oral Daily  . furosemide  80 mg Intravenous Once  . gabapentin  300 mg Oral BID  . insulin aspart  0-15 Units Subcutaneous TID WC  . levothyroxine  25 mcg Oral QAC breakfast  . multivitamin with minerals  1 tablet Oral Daily  . pantoprazole  40 mg Oral Q1200  . potassium chloride  40 mEq Oral BID  . sildenafil  20 mg Oral TID  . sodium chloride flush  3 mL Intravenous Q12H  . spironolactone  25 mg Oral Daily  . torsemide  20 mg Oral TID WC  . [START ON 02/16/2017] torsemide  60 mg Oral Daily  . Warfarin - Pharmacist Dosing Inpatient   Does not apply q1800    Infusions: . heparin 1,350 Units/hr (02/15/17 0510)    PRN Medications: sodium chloride, acetaminophen, albuterol, ondansetron (ZOFRAN) IV, sodium chloride flush, traMADol, traZODone   Assessment/Plan   1. GI bleeding: Has had 3 units PRBCs this admission.  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  Treated with APC and clipping with resolution of bleeding.  Hgb 7.7 today without overt bleeding. OK to restart heparin gtt + warfarin per GI.  - Will give 1 more unit PRBCs.  - Start on octreotide as outpatient.   - He is currently on warfarin + heparin gtt, INR 1.45.  Continue heparin until INR > 1.8.  ASA will remain on hold.  2. Acute on chronic systolic CHF: Ischemic cardiomyopathy with RV failure. HMII LVAD for DT. Med[employer]onic ICD.  Volume looks better, weight down another 2 lbs.  - Will give IV Lasix today with blood, resume home torsemide tomorrow.  - Continue spironolactone 25 mg daily  - No beta blocker with RV failure, no ACEI/ARB/ARNI with elevated creatinine.  3. CKD Stage III: Creatinine down today to 1.6.   4. Atrial fibrillation: Chronic.  Rate controlled.  Coumadin restarted.  5. HTN: MAP stable in 80s.  6. 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 Continue sildenafil.   I reviewed the LVAD parameters from today, and compared the results to the patient's prior recorded data.  No programming changes were made.  The LVAD is functioning within specified parameters.  The patient performs LVAD self-test daily.  LVAD interrogation was negative for any significant power changes, alarms or PI events/speed drops.  LVAD equipment check completed and is  in good working order.  Back-up equipment present.   LVAD education done on emergency procedures and precautions and reviewed exit site care.  Length of Stay: [place]  02/15/2017, 9:20 AM  VAD Team --- VAD ISSUES ONLY--- Pager [phone] (7am - 7am)  Advanced Heart Failure Team  Pager [phone] (M-F; 7a - 4p)  Please contact [facility] for night-coverage after hours (4p -7a ) and weekends on [email or web address]

## 2017-02-15 NOTE — Progress Notes (Signed)
ANTICOAGULATION CONSULT NOTE -Follow up Pharmacy Consult for heparin while INR < 1.8, Coumadin Indication: atrial fibrillation/LVAD  No Known Allergies  Patient Measurements: Heparin Dosing Weight: 85 kg   Vital Signs: Temp: 98.5 F (36.9 C) (04/14 2000) Temp Source: Oral (04/14 2000) BP: 95/65 (04/14 2000) Pulse Rate: 74 (04/14 2000)  Labs:  Recent Labs  02/13/17 0016  02/13/17 2005 02/14/17 0335  02/15/17 0205 02/15/17 0206 02/15/17 0938 02/15/17 1959  HGB 8.4*  --  8.8* 8.5*  --   --  7.7*  --  8.7*  HCT 25.6*  --  26.7* 26.9*  --   --  24.6*  --  26.4*  PLT 126*  --  140* 123*  --   --  103*  --   --   LABPROT 19.3*  --   --  16.5*  --   --  17.8*  --   --   INR 1.60  --   --  1.32  --   --  1.45  --   --   HEPARINUNFRC 0.14*  < >  --  0.24*  < > 0.47  --  0.59 0.37  CREATININE 1.81*  --   --  1.65*  --   --  1.62*  --   --   < > = values in this interval not displayed.  Estimated Creatinine Clearance: 40.9 mL/min (A) (by C-G formula based on SCr of 1.62 mg/dL (H)).   Medical History: Past Medical History:  Diagnosis Date  . AICD (automatic cardioverter/defibrillator) present   . Asthma   . CHF (congestive heart failure) ([place])   . Diabetes ([place])   . Kidney disease   . Presence of permanent cardiac pacemaker    AICD  . Sleep apnea    Assessment: 71 yo male on warfarin prior to admission for atrial fibrillation and LVAD admitted with symptomatic anemia.  Warfarin held in anticipation of GI procedures.  Heparin started 4/11.  Noted patient had 3 dark red bowel movements with GI prep overnight and heparin gtt was turned off at 255 AM.  Heparin resumed after GI intervention 4/13.  INR 1.45 today, heparin level remains at goal 0.37.  Hgb stable 8.7 s/p 1 PRBC unit today, Plt 103.  No overt bleeding,  Continuing heparin and Coumadin.      Goal of Therapy:  Heparin level 0.3-0.5 - GIB Monitor platelets by anticoagulation protocol: Yes   Plan:  Continue  heparin 1250 units/hr. Monitor daily heparin level, INR and CBC  Dimitri Ped, PharmD, BCPS PGY-2 Infectious Diseases Pharmacy Resident Pager: [phone] 02/15/2017, 8:42 PM

## 2017-02-16 LAB — TYPE AND SCREEN
ABO/RH(D): A NEG
Antibody Screen: NEGATIVE
UNIT DIVISION: 0
Unit division: 0

## 2017-02-16 LAB — BASIC METABOLIC PANEL
ANION GAP: 4 — AB (ref 5–15)
BUN: 28 mg/dL — ABNORMAL HIGH (ref 6–20)
CALCIUM: 8.8 mg/dL — AB (ref 8.9–10.3)
CHLORIDE: 101 mmol/L (ref 101–111)
CO2: 31 mmol/L (ref 22–32)
Creatinine, Ser: 1.86 mg/dL — ABNORMAL HIGH (ref 0.61–1.24)
GFR calc non Af Amer: 35 mL/min — ABNORMAL LOW (ref >60)
GFR, EST AFRICAN AMERICAN: 40 mL/min — AB (ref >60)
GLUCOSE: 152 mg/dL — AB (ref 65–99)
Potassium: 4.5 mmol/L (ref 3.5–5.1)
Sodium: 136 mmol/L (ref 135–145)

## 2017-02-16 LAB — CBC
HEMATOCRIT: 27.2 % — AB (ref 39.0–52.0)
HEMOGLOBIN: 8.9 g/dL — AB (ref 13.0–17.0)
MCH: 31 pg (ref 26.0–34.0)
MCHC: 32.7 g/dL (ref 30.0–36.0)
MCV: 94.8 fL (ref 78.0–100.0)
Platelets: 104 10*3/uL — ABNORMAL LOW (ref 150–400)
RBC: 2.87 MIL/uL — ABNORMAL LOW (ref 4.22–5.81)
RDW: 21.4 % — AB (ref 11.5–15.5)
WBC: 8.1 10*3/uL (ref 4.0–10.5)

## 2017-02-16 LAB — LACTATE DEHYDROGENASE: LDH: 232 U/L — ABNORMAL HIGH (ref 98–192)

## 2017-02-16 LAB — GLUCOSE, CAPILLARY
GLUCOSE-CAPILLARY: 232 mg/dL — AB (ref 65–99)
Glucose-Capillary: 123 mg/dL — ABNORMAL HIGH (ref 65–99)
Glucose-Capillary: 158 mg/dL — ABNORMAL HIGH (ref 65–99)
Glucose-Capillary: 186 mg/dL — ABNORMAL HIGH (ref 65–99)

## 2017-02-16 LAB — PROTIME-INR
INR: 1.35
Prothrombin Time: 16.8 seconds — ABNORMAL HIGH (ref 11.4–15.2)

## 2017-02-16 LAB — BPAM RBC
BLOOD PRODUCT EXPIRATION DATE: 201804252359
Blood Product Expiration Date: 201804252359
ISSUE DATE / TIME: 201804141249
UNIT TYPE AND RH: 600
Unit Type and Rh: 600

## 2017-02-16 LAB — HEPARIN LEVEL (UNFRACTIONATED): Heparin Unfractionated: 0.4 IU/mL (ref 0.30–0.70)

## 2017-02-16 MED ORDER — WARFARIN SODIUM 10 MG PO TABS
10.0000 mg | ORAL_TABLET | Freq: Once | ORAL | Status: AC
  Administered 2017-02-16: 10 mg via ORAL
  Filled 2017-02-16: qty 1

## 2017-02-16 NOTE — Progress Notes (Signed)
ANTICOAGULATION CONSULT NOTE -Follow up Pharmacy Consult for heparin while INR < 1.8, Coumadin Indication: atrial fibrillation/LVAD  No Known Allergies  Patient Measurements: Heparin Dosing Weight: 85 kg   Vital Signs: Temp: 97.7 F (36.5 C) (04/15 0400) Temp Source: Oral (04/15 0400) BP: 94/58 (04/15 0800) Pulse Rate: 82 (04/15 0400)  Labs:  Recent Labs  02/14/17 0335  02/15/17 0206 02/15/17 0938 02/15/17 1959 02/16/17 0313  HGB 8.5*  --  7.7*  --  8.7* 8.9*  HCT 26.9*  --  24.6*  --  26.4* 27.2*  PLT 123*  --  103*  --   --  104*  LABPROT 16.5*  --  17.8*  --   --  16.8*  INR 1.32  --  1.45  --   --  1.35  HEPARINUNFRC 0.24*  < >  --  0.59 0.37 0.40  CREATININE 1.65*  --  1.62*  --   --  1.86*  < > = values in this interval not displayed.  Estimated Creatinine Clearance: 35.5 mL/min (A) (by C-G formula based on SCr of 1.86 mg/dL (H)).   Medical History: Past Medical History:  Diagnosis Date  . AICD (automatic cardioverter/defibrillator) present   . Asthma   . CHF (congestive heart failure) ([place])   . Diabetes ([place])   . Kidney disease   . Presence of permanent cardiac pacemaker    AICD  . Sleep apnea    . heparin 1,250 Units/hr (02/16/17 2233)     Assessment: 71 yo male on warfarin prior to admission for atrial fibrillation and LVAD admitted with symptomatic anemia.  Warfarin held in anticipation of GI procedures.  Heparin started 4/11.  Noted patient had 3 dark red bowel movements with GI prep overnight and heparin gtt was turned off at 255 AM.  Heparin resumed after GI intervention 4/13.  INR 1.35 today, heparin level at goal at 0.4.  Hgb back up a bit after PRBCs yesterday.  No overt bleeding.  Goal of Therapy:  Heparin level 0.3-0.5 - GIB INR 2-2.5 Monitor platelets by anticoagulation protocol: Yes   Plan:  Continue IV heparin at 1250 units/hr. Daily heparin level and CBC. Coumadin 10 mg x 1. Daily INR.  Uvaldo Rising, BCPS  Clinical  Pharmacist Pager [phone]  02/16/2017 1:19 PM

## 2017-02-16 NOTE — Progress Notes (Signed)
Patient ID: Trevor [name], male   DOB: [birth year], 71 y.o.   MRN: [id number] HeartMate 2 Rounding Note  Subjective:    Admitted with symptomatic anemia. Admit Hgb 6.5. So far he has received 4U PRBCs.   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   1 unit PRBCs yesterday.  Hemoglobin appropriate bump to 8.9 today.  No overt bleeding since colonoscopy.  Heparin gtt + warfarin restarted, INR 1.35.  Transferrin saturation 31% on 4/10.   Weight down.  Back on home torsemide.  MAP 80s  LVAD INTERROGATION:  HeartMate II LVAD:  Flow 5.7 liters/min, speed 9000, power 5.5, PI 5.7, no PI events  Objective:    Vital Signs:   Temp:  [97.6 F (36.4 C)-98.5 F (36.9 C)] 97.7 F (36.5 C) (04/15 0400) Pulse Rate:  [68-92] 82 (04/15 0400) Resp:  [18-20] 18 (04/14 1546) BP: (89-106)/(49-82) 94/58 (04/15 0800) SpO2:  [97 %-100 %] 98 % (04/15 0400) Weight:  [176 lb 8 oz (80.1 kg)] 176 lb 8 oz (80.1 kg) (04/15 0400) Last BM Date: 02/14/17 Mean arterial Pressure 80s    Intake/Output:   Intake/Output Summary (Last 24 hours) at 02/16/17 0951 Last data filed at 02/16/17 0750  Gross per 24 hour  Intake          1161.93 ml  Output             2000 ml  Net          -838.07 ml    Physical Exam: GENERAL: NAD, sitting in bed.   HEENT: normal  NECK: Supple, JVP 8.  2+ bilaterally, no bruits.  No lymphadenopathy or thyromegaly appreciated.   CARDIAC:  Mechanical heart sounds with LVAD hum present.  LUNGS:  CTAB on room air.  ABDOMEN:  Soft, round, nontender, positive bowel sounds x4.     LVAD exit site: well-healed and incorporated.  Dressing dry and intact.  No erythema or drainage.  Stabilization device present and accurately applied.  Driveline dressing is being changed daily per sterile technique. EXTREMITIES:  Warm and dry, no cyanosis, clubbing, rash. 1+ ankle edema. NEUROLOGIC:  Alert and oriented x 4.  Gait steady.  No aphasia.  No dysarthria.   Affect pleasant.     Telemetry:  A fib 80s Personally reviewed.   Labs: Basic Metabolic Panel:  Recent Labs Lab 02/12/17 0238 02/13/17 0016 02/14/17 0335 02/15/17 0206 02/16/17 0313  NA 137 136 138 138 136  K 3.4* 4.3 4.1 4.4 4.5  CL 96* 97* 98* 101 101  CO2 28 31 29 30 31   GLUCOSE 88 130* 150* 153* 152*  BUN 46* 45* 35* 30* 28*  CREATININE 1.69* 1.81* 1.65* 1.62* 1.86*  CALCIUM 8.8* 9.0 9.3 8.9 8.8*    Liver Function Tests: No results for input(s): AST, ALT, ALKPHOS, BILITOT, PROT, ALBUMIN in the last 168 hours. No results for input(s): LIPASE, AMYLASE in the last 168 hours. No results for input(s): AMMONIA in the last 168 hours.  CBC:  Recent Labs Lab 02/11/17 0711  02/13/17 0016 02/13/17 2005 02/14/17 0335 02/15/17 0206 02/15/17 1959 02/16/17 0313  WBC 8.9  < > 9.8 9.7 9.0 8.6  --  8.1  NEUTROABS 6.6  --   --  6.6  --   --   --   --   HGB 7.7*  < > 8.4* 8.8* 8.5* 7.7* 8.7* 8.9*  HCT 23.2*  < > 25.6* 26.7* 26.9* 24.6* 26.4*  27.2*  MCV 93.5  < > 93.4 94.0 95.1 96.1  --  94.8  PLT 111*  < > 126* 140* 123* 103*  --  104*  < > = values in this interval not displayed.  INR:  Recent Labs Lab 02/12/17 1356 02/13/17 0016 02/14/17 0335 02/15/17 0206 02/16/17 0313  INR 1.73 1.60 1.32 1.45 1.35    Other results:  EKG:   Imaging: No results found.   Medications:     Scheduled Medications: . sodium chloride   Intravenous Once  . amLODipine  2.5 mg Oral Daily  . atorvastatin  40 mg Oral Daily  . busPIRone  5 mg Oral BID  . docusate sodium  200 mg Oral Daily  . gabapentin  300 mg Oral BID  . insulin aspart  0-15 Units Subcutaneous TID WC  . levothyroxine  25 mcg Oral QAC breakfast  . multivitamin with minerals  1 tablet Oral Daily  . pantoprazole  40 mg Oral Q1200  . potassium chloride  40 mEq Oral BID  . sildenafil  20 mg Oral TID  . sodium chloride flush  3 mL Intravenous Q12H  . spironolactone  25 mg Oral Daily  . torsemide  60 mg Oral Daily   . Warfarin - Pharmacist Dosing Inpatient   Does not apply q1800    Infusions: . heparin 1,250 Units/hr (02/16/17 0323)    PRN Medications: sodium chloride, acetaminophen, albuterol, ondansetron (ZOFRAN) IV, sodium chloride flush, traMADol, traZODone   Assessment/Plan   1. GI bleeding: Has had 4 units PRBCs this admission.  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  Treated with APC and clipping with resolution of bleeding.  Hgb 8.9 today without overt bleeding. He is back on heparin gtt + warfarin.  - Start on octreotide as outpatient.   - He is currently on warfarin + heparin gtt, INR 1.35.  Continue heparin until INR > 1.8.  ASA will remain on hold.  2. Acute on chronic systolic CHF: Ischemic cardiomyopathy with RV failure. HMII LVAD for DT. Medtronic ICD.  Volume looks better, weight down.  - Continue torsemide 60 daily.   - Continue spironolactone 25 mg daily  - No beta blocker with RV failure, no ACEI/ARB/ARNI with elevated creatinine.  3. CKD Stage III: Creatinine 1.86 today.    4. Atrial fibrillation: Chronic. Rate controlled.  Coumadin restarted.  5. HTN: MAP stable in 80s.  6. 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 Continue sildenafil.   I reviewed the LVAD parameters from today, and compared the results to the patient's prior recorded data.  No programming changes were made.  The LVAD is functioning within specified parameters.  The patient performs LVAD self-test daily.  LVAD interrogation was negative for any significant power changes, alarms or PI events/speed drops.  LVAD equipment check completed and is in good working order.  Back-up equipment present.   LVAD education done on emergency procedures and precautions and reviewed exit site care.  Length of Stay: [place]  02/16/2017, 9:51 AM  VAD Team --- VAD ISSUES ONLY--- Pager [phone] (7am - 7am)  Advanced Heart Failure Team  Pager [phone] (M-F; 7a - 4p)  Please contact [facility] for night-coverage after hours (4p  -7a ) and weekends on [email or web address]

## 2017-02-17 ENCOUNTER — Other Ambulatory Visit: Payer: Self-pay | Admitting: Unknown Physician Specialty

## 2017-02-17 LAB — CBC
HCT: 26.7 % — ABNORMAL LOW (ref 39.0–52.0)
HEMOGLOBIN: 8.4 g/dL — AB (ref 13.0–17.0)
MCH: 29.6 pg (ref 26.0–34.0)
MCHC: 31.5 g/dL (ref 30.0–36.0)
MCV: 94 fL (ref 78.0–100.0)
PLATELETS: 117 10*3/uL — AB (ref 150–400)
RBC: 2.84 MIL/uL — AB (ref 4.22–5.81)
RDW: 20.5 % — ABNORMAL HIGH (ref 11.5–15.5)
WBC: 6.8 10*3/uL (ref 4.0–10.5)

## 2017-02-17 LAB — BASIC METABOLIC PANEL
ANION GAP: 9 (ref 5–15)
BUN: 30 mg/dL — ABNORMAL HIGH (ref 6–20)
CO2: 30 mmol/L (ref 22–32)
Calcium: 8.7 mg/dL — ABNORMAL LOW (ref 8.9–10.3)
Chloride: 95 mmol/L — ABNORMAL LOW (ref 101–111)
Creatinine, Ser: 1.73 mg/dL — ABNORMAL HIGH (ref 0.61–1.24)
GFR, EST AFRICAN AMERICAN: 44 mL/min — AB (ref >60)
GFR, EST NON AFRICAN AMERICAN: 38 mL/min — AB (ref >60)
GLUCOSE: 166 mg/dL — AB (ref 65–99)
Potassium: 4.4 mmol/L (ref 3.5–5.1)
Sodium: 134 mmol/L — ABNORMAL LOW (ref 135–145)

## 2017-02-17 LAB — GLUCOSE, CAPILLARY
GLUCOSE-CAPILLARY: 131 mg/dL — AB (ref 65–99)
GLUCOSE-CAPILLARY: 156 mg/dL — AB (ref 65–99)
Glucose-Capillary: 205 mg/dL — ABNORMAL HIGH (ref 65–99)
Glucose-Capillary: 193 mg/dL — ABNORMAL HIGH (ref 65–99)

## 2017-02-17 LAB — PROTIME-INR
INR: 1.26
Prothrombin Time: 15.9 seconds — ABNORMAL HIGH (ref 11.4–15.2)

## 2017-02-17 LAB — HEPARIN LEVEL (UNFRACTIONATED)
HEPARIN UNFRACTIONATED: 0.24 [IU]/mL — AB (ref 0.30–0.70)
HEPARIN UNFRACTIONATED: 0.27 [IU]/mL — AB (ref 0.30–0.70)

## 2017-02-17 LAB — LACTATE DEHYDROGENASE: LDH: 224 U/L — AB (ref 98–192)

## 2017-02-17 MED ORDER — OCTREOTIDE ACETATE 20 MG IM KIT: 20.0000 mg | PACK | INTRAMUSCULAR | Status: DC

## 2017-02-17 MED ORDER — WARFARIN SODIUM 10 MG PO TABS
10.0000 mg | ORAL_TABLET | Freq: Once | ORAL | Status: AC
  Administered 2017-02-17: 10 mg via ORAL
  Filled 2017-02-17: qty 1

## 2017-02-17 NOTE — Progress Notes (Signed)
Drive line dressing changed without difficulty.

## 2017-02-17 NOTE — Progress Notes (Signed)
ANTICOAGULATION CONSULT NOTE - Follow Up Consult  Pharmacy Consult for Heparin (while INR is <1.8) Indication: Afib/LVAD  No Known Allergies  Patient Measurements: Height: 5\' 5"  (165.1 cm) Weight: 174 lb 9.6 oz (79.2 kg) IBW/kg (Calculated) : 61.5  Vital Signs: Temp: 97.8 F (36.6 C) (04/16 0436) Temp Source: Oral (04/16 0436) BP: 101/74 (04/16 1200) Pulse Rate: 62 (04/16 0436)  Labs:  Recent Labs  02/15/17 0206  02/15/17 1959 02/16/17 0313 02/17/17 0253  HGB 7.7*  --  8.7* 8.9* 8.4*  HCT 24.6*  --  26.4* 27.2* 26.7*  PLT 103*  --   --  104* 117*  LABPROT 17.8*  --   --  16.8* 15.9*  INR 1.45  --   --  1.35 1.26  HEPARINUNFRC  --   < > 0.37 0.40 0.24*  CREATININE 1.62*  --   --  1.86* 1.73*  < > = values in this interval not displayed.  Estimated Creatinine Clearance: 38 mL/min (A) (by C-G formula based on SCr of 1.73 mg/dL (H)).  Assessment: On heparin drip while INR is sub-therapeutic (<1.8).   Warfarin has been re-started. INR 1.4 this AM. No further bleeding issues noted. Hgb up to 8.9.   Will continue heparin, therapeutic on 1450 units/hr.  Goal of Therapy:  INR goal 1.8-2.3 Heparin level 0.3-0.5 units/ml Monitor platelets by anticoagulation protocol: Yes   Plan:  Continue heparin at 1450 units/hr Repeat warfarin 10mg  Daily coags  Erin Hearing PharmD., BCPS Clinical Pharmacist Pager [phone] 02/17/2017 12:55 PM

## 2017-02-17 NOTE — Progress Notes (Signed)
ANTICOAGULATION CONSULT NOTE - Follow Up Consult  Pharmacy Consult for Heparin (while INR is <1.8) Indication: Afib/LVAD  No Known Allergies  Patient Measurements: Height: 5\' 5"  (165.1 cm) Weight: 174 lb 9.6 oz (79.2 kg) IBW/kg (Calculated) : 61.5  Vital Signs: Temp: 97.8 F (36.6 C) (04/16 0436) Temp Source: Oral (04/16 0436) BP: 101/74 (04/16 1200) Pulse Rate: 62 (04/16 0436)  Labs:  Recent Labs  02/15/17 0206  02/15/17 1959 02/16/17 0313 02/17/17 0253 02/17/17 1435  HGB 7.7*  --  8.7* 8.9* 8.4*  --   HCT 24.6*  --  26.4* 27.2* 26.7*  --   PLT 103*  --   --  104* 117*  --   LABPROT 17.8*  --   --  16.8* 15.9*  --   INR 1.45  --   --  1.35 1.26  --   HEPARINUNFRC  --   < > 0.37 0.40 0.24* 0.27*  CREATININE 1.62*  --   --  1.86* 1.73*  --   < > = values in this interval not displayed.  Estimated Creatinine Clearance: 38 mL/min (A) (by C-G formula based on SCr of 1.73 mg/dL (H)).  Assessment: On heparin drip while INR is sub-therapeutic (<1.8). Warfarin has been re-started. INR 1.26 this AM. Continue heparin. Heparin level is sub-therapeutic 0.27 after rate increase this AM. No issues per RN.   Goal of Therapy:  Heparin level 0.3-0.7 units/ml Monitor platelets by anticoagulation protocol: Yes   Plan:  -Inc heparin to 1450 units/hr -f/u 8 hr heparin level at midnight -Trend Hgb  Maryanna Shape, PharmD, BCPS  Clinical Pharmacist  Pager: [phone]   02/17/2017,4:08 PM

## 2017-02-17 NOTE — Progress Notes (Signed)
ANTICOAGULATION CONSULT NOTE - Follow Up Consult  Pharmacy Consult for Heparin (while INR is <1.8) Indication: Afib/LVAD  No Known Allergies  Patient Measurements: Height: 5\' 5"  (165.1 cm) Weight: 176 lb 8 oz (80.1 kg) IBW/kg (Calculated) : 61.5  Vital Signs: Temp: 97.6 F (36.4 C) (04/16 0000) Temp Source: Oral (04/16 0000) BP: 90/71 (04/16 0000) Pulse Rate: 68 (04/16 0000)  Labs:  Recent Labs  02/15/17 0206  02/15/17 1959 02/16/17 0313 02/17/17 0253  HGB 7.7*  --  8.7* 8.9* 8.4*  HCT 24.6*  --  26.4* 27.2* 26.7*  PLT 103*  --   --  104* 117*  LABPROT 17.8*  --   --  16.8* 15.9*  INR 1.45  --   --  1.35 1.26  HEPARINUNFRC  --   < > 0.37 0.40 0.24*  CREATININE 1.62*  --   --  1.86* 1.73*  < > = values in this interval not displayed.  Estimated Creatinine Clearance: 38.2 mL/min (A) (by C-G formula based on SCr of 1.73 mg/dL (H)).  Assessment: On heparin drip while INR is sub-therapeutic (<1.8). Warfarin has been re-started. INR 1.26 this AM. Continue heparin. Heparin level is sub-therapeutic this AM. No issues per RN.   Goal of Therapy:  Heparin level 0.3-0.7 units/ml Monitor platelets by anticoagulation protocol: Yes   Plan:  -Inc heparin to 1350 units/hr -1300 HL -Trend Hgb  Narda Bonds 02/17/2017,4:26 AM

## 2017-02-17 NOTE — Progress Notes (Signed)
LVAD Coordinator rounding note:  Admitted 4/9/18due to GI bleed. 3 units of PRBC's to date.  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 Prescott Gum.    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   Labs:  LDH trend:246>258>267>224  INR trend: 2.43>2.00>1.32>1.26  Anticoagulation Plan: -INR Goal: 2-2.5 (restarted coumadin 4/13) -ASA Dose: 325 mg (on hold)  Adverse Events on VAD: -GIB with AVM   Plan/Recommendations:   1. Nursing please change dressing today.  2. Octreotide scheduled for April 26th at 1100 in medical day, orders placed.  3. Monitor drive line exit site closely, discussed with RN.   [name], VAD Coordinator 24/7 pager [phone]

## 2017-02-17 NOTE — Progress Notes (Signed)
Patient ID: Trevor [name], male   DOB: Oct 26, 1946, 71 y.o.   MRN: [id number] HeartMate 2 Rounding Note  Subjective:    Admitted with symptomatic anemia. Admit Hgb 6.5. So far he has received 4U PRBCs.   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   Todays hgb 8.4. No bleeding. Has not had a bowel movement since 02/14/17.   Overall feels good. Denies SOB   LVAD INTERROGATION:  HeartMate II LVAD:  Flow 5.4 liters/min, speed 9000, power 5, PI 6.2 no PI events  Objective:    Vital Signs:   Temp:  [97.6 F (36.4 C)-98.6 F (37 C)] 97.8 F (36.6 C) (04/16 0436) Pulse Rate:  [52-87] 62 (04/16 0436) Resp:  [18] 18 (04/16 0436) BP: (90-98)/(47-85) 90/56 (04/16 0800) SpO2:  [96 %-100 %] 97 % (04/16 0436) Weight:  [174 lb 9.6 oz (79.2 kg)] 174 lb 9.6 oz (79.2 kg) (04/16 0436) Last BM Date: 02/14/17 Mean arterial Pressure 80s    Intake/Output:   Intake/Output Summary (Last 24 hours) at 02/17/17 1000 Last data filed at 02/17/17 0800  Gross per 24 hour  Intake           408.66 ml  Output             3600 ml  Net         -3191.34 ml     Physical Exam: GENERAL: Well appearing, male. Sitting on the side of the bed.  HEENT: normal  NECK: Supple, JVP 7-8  .  2+ bilaterally, no bruits.  No lymphadenopathy or thyromegaly appreciated.   CARDIAC:  Mechanical heart sounds with LVAD hum present.  LUNGS:  Clear to auscultation bilaterally.  ABDOMEN:  Soft, round, nontender, positive bowel sounds x4.     LVAD exit site: well-healed and incorporated.  Dressing dry and intact.  No erythema or drainage.  Stabilization device present and accurately applied.  Driveline dressing is being changed daily per sterile technique. EXTREMITIES:  Warm and dry, no cyanosis, clubbing, rash or edema  NEUROLOGIC:  Alert and oriented x 4.  Gait steady.  No aphasia.  No dysarthria.  Affect pleasant.       Telemetry:  A fib 80s. Personally reviewed    Labs: Basic Metabolic Panel:  Recent Labs Lab 02/13/17 0016 02/14/17 0335 02/15/17 0206 02/16/17 0313 02/17/17 0253  NA 136 138 138 136 134*  K 4.3 4.1 4.4 4.5 4.4  CL 97* 98* 101 101 95*  CO2 31 29 30 31 30   GLUCOSE 130* 150* 153* 152* 166*  BUN 45* 35* 30* 28* 30*  CREATININE 1.81* 1.65* 1.62* 1.86* 1.73*  CALCIUM 9.0 9.3 8.9 8.8* 8.7*    Liver Function Tests: No results for input(s): AST, ALT, ALKPHOS, BILITOT, PROT, ALBUMIN in the last 168 hours. No results for input(s): LIPASE, AMYLASE in the last 168 hours. No results for input(s): AMMONIA in the last 168 hours.  CBC:  Recent Labs Lab 02/11/17 0711  02/13/17 2005 02/14/17 0335 02/15/17 0206 02/15/17 1959 02/16/17 0313 02/17/17 0253  WBC 8.9  < > 9.7 9.0 8.6  --  8.1 6.8  NEUTROABS 6.6  --  6.6  --   --   --   --   --   HGB 7.7*  < > 8.8* 8.5* 7.7* 8.7* 8.9* 8.4*  HCT 23.2*  < > 26.7* 26.9* 24.6* 26.4* 27.2* 26.7*  MCV 93.5  < > 94.0 95.1 96.1  --  94.8 94.0  PLT 111*  < > 140* 123* 103*  --  104* 117*  < > = values in this interval not displayed.  INR:  Recent Labs Lab 02/13/17 0016 02/14/17 0335 02/15/17 0206 02/16/17 0313 02/17/17 0253  INR 1.60 1.32 1.45 1.35 1.26    Other results:  EKG:   Imaging: No results found.   Medications:     Scheduled Medications: . sodium chloride   Intravenous Once  . amLODipine  2.5 mg Oral Daily  . atorvastatin  40 mg Oral Daily  . busPIRone  5 mg Oral BID  . docusate sodium  200 mg Oral Daily  . gabapentin  300 mg Oral BID  . insulin aspart  0-15 Units Subcutaneous TID WC  . levothyroxine  25 mcg Oral QAC breakfast  . multivitamin with minerals  1 tablet Oral Daily  . pantoprazole  40 mg Oral Q1200  . potassium chloride  40 mEq Oral BID  . sildenafil  20 mg Oral TID  . sodium chloride flush  3 mL Intravenous Q12H  . spironolactone  25 mg Oral Daily  . torsemide  60 mg Oral Daily  . Warfarin - Pharmacist Dosing Inpatient   Does not apply  q1800    Infusions: . heparin 1,350 Units/hr (02/17/17 0800)    PRN Medications: sodium chloride, acetaminophen, albuterol, ondansetron (ZOFRAN) IV, sodium chloride flush, traMADol, traZODone   Assessment/Plan   1. GI bleeding: Has had 4 units PRBCs this admission.  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  Treated with APC and clipping with resolution of bleeding.   Todays hgb 8.4. No bleeding problems.  - Start on octreotide as outpatient.   - INR 1.26. Continue coumadin and heparin drip. Pharmacy dosing and following closely. Heparin until INR 1.8 or greater.  2. Acute on chronic systolic CHF: Ischemic cardiomyopathy with RV failure. HMII LVAD for DT. Medtronic ICD.  Volume status stable. Continue torsemide 60 mg daily and spiro 25 mg daily.  - No beta blocker with RV failure, no ACEI/ARB/ARNI with elevated creatinine.  LDH stable. INR remains low.  3. CKD Stage III: Stable today. Creatinine 1.73.    4. Atrial fibrillation: Chronic. Rate controlled.  Coumadin restarted.  5. HTN: Stable MAP in the 80s.   6. 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 Continue sildenafil.   He will remain in house until INR therapeutic.   I reviewed the LVAD parameters from today, and compared the results to the patient's prior recorded data.  No programming changes were made.  The LVAD is functioning within specified parameters.  The patient performs LVAD self-test daily.  LVAD interrogation was negative for any significant power changes, alarms or PI events/speed drops.  LVAD equipment check completed and is in good working order.  Back-up equipment present.   LVAD education done on emergency procedures and precautions and reviewed exit site care.  Length of Stay: 7  Amy Clegg NP-C  02/17/2017, 10:00 AM  VAD Team --- VAD ISSUES ONLY--- Pager [phone] (7am - 7am)  Advanced Heart Failure Team  Pager [phone] (M-F; 7a - 4p)  Please contact [facility] for night-coverage after hours (4p -7a ) and weekends on  [email or web address]  Patient seen with NP, agree with the above note.  Stable today.  INR 1.26 today, no further overt bleeding.  Hemoglobin fairly stable.  On heparin gtt/warfarin, continue heparin and keep here until INR > 1.8.   Volume status looks good, continue torsemide.   BP stable.   Creatinine stable.  Loralie Champagne 02/17/2017 10:41 AM

## 2017-02-18 LAB — BASIC METABOLIC PANEL
ANION GAP: 9 (ref 5–15)
BUN: 33 mg/dL — AB (ref 6–20)
CO2: 29 mmol/L (ref 22–32)
Calcium: 9 mg/dL (ref 8.9–10.3)
Chloride: 95 mmol/L — ABNORMAL LOW (ref 101–111)
Creatinine, Ser: 1.77 mg/dL — ABNORMAL HIGH (ref 0.61–1.24)
GFR, EST AFRICAN AMERICAN: 43 mL/min — AB (ref >60)
GFR, EST NON AFRICAN AMERICAN: 37 mL/min — AB (ref >60)
GLUCOSE: 195 mg/dL — AB (ref 65–99)
POTASSIUM: 4.6 mmol/L (ref 3.5–5.1)
SODIUM: 133 mmol/L — AB (ref 135–145)

## 2017-02-18 LAB — CBC
HCT: 27.7 % — ABNORMAL LOW (ref 39.0–52.0)
HEMOGLOBIN: 8.9 g/dL — AB (ref 13.0–17.0)
MCH: 30.5 pg (ref 26.0–34.0)
MCHC: 32.1 g/dL (ref 30.0–36.0)
MCV: 94.9 fL (ref 78.0–100.0)
PLATELETS: 132 10*3/uL — AB (ref 150–400)
RBC: 2.92 MIL/uL — AB (ref 4.22–5.81)
RDW: 20.3 % — ABNORMAL HIGH (ref 11.5–15.5)
WBC: 8.6 10*3/uL (ref 4.0–10.5)

## 2017-02-18 LAB — GLUCOSE, CAPILLARY
GLUCOSE-CAPILLARY: 164 mg/dL — AB (ref 65–99)
GLUCOSE-CAPILLARY: 127 mg/dL — AB (ref 65–99)
GLUCOSE-CAPILLARY: 168 mg/dL — AB (ref 65–99)
Glucose-Capillary: 176 mg/dL — ABNORMAL HIGH (ref 65–99)

## 2017-02-18 LAB — HEPARIN LEVEL (UNFRACTIONATED): Heparin Unfractionated: 0.37 IU/mL (ref 0.30–0.70)

## 2017-02-18 LAB — PROTIME-INR
INR: 1.4
PROTHROMBIN TIME: 17.3 s — AB (ref 11.4–15.2)

## 2017-02-18 LAB — LACTATE DEHYDROGENASE: LDH: 230 U/L — AB (ref 98–192)

## 2017-02-18 MED ORDER — WARFARIN SODIUM 10 MG PO TABS
10.0000 mg | ORAL_TABLET | Freq: Once | ORAL | Status: AC
Start: 2017-02-18 — End: 2017-02-18
  Administered 2017-02-18: 10 mg via ORAL
  Filled 2017-02-18: qty 1

## 2017-02-18 NOTE — Progress Notes (Signed)
Patient ID: Trevor [name], male   DOB: 03/12/1946, 71 y.o.   MRN: [id number] HeartMate 2 Rounding Note  Subjective:    Admitted with symptomatic anemia. Admit Hgb 6.5. So far he has received 4U PRBCs.   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   Todays hgb is 8.6.    Overall feels good. Denies SOB  LVAD INTERROGATION:  HeartMate II LVAD:  Flow 4.7liters/min, speed 9000, power 5, PI 6.3 no PI events  Objective:    Vital Signs:   Temp:  [97.6 F (36.4 C)] 97.6 F (36.4 C) (04/17 0500) Pulse Rate:  [66] 66 (04/16 2105) BP: (85-101)/(56-74) 85/69 (04/16 1600) SpO2:  [98 %] 98 % (04/17 0500) Weight:  [172 lb 14.4 oz (78.4 kg)] 172 lb 14.4 oz (78.4 kg) (04/17 0500) Last BM Date: 02/14/17 Mean arterial Pressure 90s     Intake/Output:   Intake/Output Summary (Last 24 hours) at 02/18/17 0748 Last data filed at 02/18/17 0636  Gross per 24 hour  Intake             69.7 ml  Output             2750 ml  Net          -2680.3 ml      Physical Exam: GENERAL: Well appearing, male  in no acute distress. HEENT: normal  NECK: Supple, JVP 7-8 .  2+ bilaterally, no bruits.  No lymphadenopathy or thyromegaly appreciated.   CARDIAC:  Mechanical heart sounds with LVAD hum present.  LUNGS:  Clear to auscultation bilaterally.  ABDOMEN:  Soft, round, nontender, positive bowel sounds x4.     LVAD exit site: well-healed and incorporated.  Dressing dry and intact.  No erythema or drainage.  Stabilization device present and accurately applied.  Driveline dressing is being changed daily per sterile technique. EXTREMITIES:  Warm and dry, no cyanosis, clubbing, rash . R and LLE ted hose. trce -1 + edema.   NEUROLOGIC:  Alert and oriented x 4.  Gait steady.  No aphasia.  No dysarthria.  Affect pleasant.            Telemetry:  A fib 60-70s.    Labs: Basic Metabolic Panel:  Recent Labs Lab 02/14/17 0335 02/15/17 0206 02/16/17 0313  02/17/17 0253 02/18/17 0009  NA 138 138 136 134* 133*  K 4.1 4.4 4.5 4.4 4.6  CL 98* 101 101 95* 95*  CO2 29 30 31 30 29   GLUCOSE 150* 153* 152* 166* 195*  BUN 35* 30* 28* 30* 33*  CREATININE 1.65* 1.62* 1.86* 1.73* 1.77*  CALCIUM 9.3 8.9 8.8* 8.7* 9.0    Liver Function Tests: No results for input(s): AST, ALT, ALKPHOS, BILITOT, PROT, ALBUMIN in the last 168 hours. No results for input(s): LIPASE, AMYLASE in the last 168 hours. No results for input(s): AMMONIA in the last 168 hours.  CBC:  Recent Labs Lab 02/13/17 2005 02/14/17 0335 02/15/17 0206 02/15/17 1959 02/16/17 0313 02/17/17 0253 02/18/17 0009  WBC 9.7 9.0 8.6  --  8.1 6.8 8.6  NEUTROABS 6.6  --   --   --   --   --   --   HGB 8.8* 8.5* 7.7* 8.7* 8.9* 8.4* 8.9*  HCT 26.7* 26.9* 24.6* 26.4* 27.2* 26.7* 27.7*  MCV 94.0 95.1 96.1  --  94.8 94.0 94.9  PLT 140* 123* 103*  --  104* 117* 132*    INR:  Recent  Labs Lab 02/14/17 0335 02/15/17 0206 02/16/17 0313 02/17/17 0253 02/18/17 0009  INR 1.32 1.45 1.35 1.26 1.40    Other results:  EKG:   Imaging: No results found.   Medications:     Scheduled Medications: . sodium chloride   Intravenous Once  . amLODipine  2.5 mg Oral Daily  . atorvastatin  40 mg Oral Daily  . busPIRone  5 mg Oral BID  . docusate sodium  200 mg Oral Daily  . gabapentin  300 mg Oral BID  . insulin aspart  0-15 Units Subcutaneous TID WC  . levothyroxine  25 mcg Oral QAC breakfast  . multivitamin with minerals  1 tablet Oral Daily  . pantoprazole  40 mg Oral Q1200  . potassium chloride  40 mEq Oral BID  . sildenafil  20 mg Oral TID  . sodium chloride flush  3 mL Intravenous Q12H  . spironolactone  25 mg Oral Daily  . torsemide  60 mg Oral Daily  . Warfarin - Pharmacist Dosing Inpatient   Does not apply q1800    Infusions: . heparin 1,450 Units/hr (02/17/17 2121)    PRN Medications: sodium chloride, acetaminophen, albuterol, ondansetron (ZOFRAN) IV, sodium chloride  flush, traMADol, traZODone   Assessment/Plan   1. GI bleeding: Has had 4 units PRBCs this admission.  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  Treated with APC and clipping with resolution of bleeding.   Todays hgb 8.6.  No bleeding problems.  - Start on octreotide as outpatient.   - INR slowly trending up. 1.4 today. Continue coumadin and heparin drip.  He will stay off ASA.  - Pharmacy dosing and following closely. Heparin until INR 1.8 or greater.  2. Acute on chronic systolic CHF: Ischemic cardiomyopathy with RV failure. HMII LVAD for DT. Medtronic ICD.  Volume status stable. Continue torsemide 60 mg daily and spiro 25 mg daily.  - No beta blocker with RV failure, no ACEI/ARB/ARNI with elevated creatinine.  LDH stable. INR remains low.  3. CKD Stage III: Stable today. Creatinine 1.77. Repeat BMET in am.     4. Atrial fibrillation: Chronic. Rate controlled.  Coumadin restarted.  5. HTN: Stable MAP in the 80s.   6. 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 Continue sildenafil.   Anticipate D/C likely Thursday.  I reviewed the LVAD parameters from today, and compared the results to the patient's prior recorded data.  No programming changes were made.  The LVAD is functioning within specified parameters.  The patient performs LVAD self-test daily.  LVAD interrogation was negative for any significant power changes, alarms or PI events/speed drops.  LVAD equipment check completed and is in good working order.  Back-up equipment present.   LVAD education done on emergency procedures and precautions and reviewed exit site care.  Length of Stay: [place] NP-C  02/18/2017, 7:48 AM  VAD Team --- VAD ISSUES ONLY--- Pager [phone] (7am - 7am)  Advanced Heart Failure Team  Pager [phone] (M-F; 7a - 4p)  Please contact [facility] for night-coverage after hours (4p -7a ) and weekends on [email or web address]  Patient seen with NP, agree with the above note.  INR trending up now.  When > 1.8, will stop heparin gtt and  let go home.  He will stay off ASA. No overt bleeding, hgb stable.   Start octreotide as outpatient.   Weight down, creatinine stable.  Continue current torsemide.   Loralie Champagne 02/18/2017 1:01 PM

## 2017-02-18 NOTE — Progress Notes (Signed)
LVAD Coordinator rounding note:  Admitted 4/9/18due to GI bleed. 3 units of PRBC's to date.  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 Prescott Gum.    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   Labs:  LDH trend:246>258>267>224>230  INR trend: 2.43>2.00>1.32>1.26>1.40  Anticoagulation Plan: -INR Goal: 2-2.5 (restarted coumadin 4/13) -ASA Dose: 325 mg (on hold)  Adverse Events on VAD: -GIB with AVM   Plan/Recommendations:   1. Awaiting INR to become therapeutic. Pt ready for discharged.  [name], VAD Coordinator 24/7 pager [phone]

## 2017-02-18 NOTE — Care Management Note (Signed)
Case Management Note [name] RN, BSN Unit 2W-Case Manager [phone]  Patient Details  Name: Trevor [name] MRN: [id number] Date of Birth: 1946/03/18  Subjective/Objective:  Pt admitted with symptomatic anemia- hx LVAD                  Action/Plan: PTA pt lived at home- was active with [facility] for [facility]- will need resumption order for discharge if MD feels pt still need HH services- CM to follow.   Expected Discharge Date:                  Expected Discharge Plan:  [place]  In-House Referral:     Discharge planning Services  CM Consult  Post Acute Care Choice:  Home Health, Resumption of Svcs/PTA Provider Choice offered to:  Patient  DME Arranged:    DME Agency:     HH Arranged:  RN, Disease Management [place] Agency:  [place]  Status of Service:  In process, will continue to follow  If discussed at Long Length of Stay Meetings, dates discussed:    Additional Comments:  [name], RN 02/18/2017, 3:11 PM

## 2017-02-18 NOTE — Progress Notes (Signed)
ANTICOAGULATION CONSULT NOTE Pharmacy Consult for Heparin Indication: Afib/LVAD  No Known Allergies  Patient Measurements: Height: 5\' 5"  (165.1 cm) Weight: 174 lb 9.6 oz (79.2 kg) IBW/kg (Calculated) : 61.5  Vital Signs: Temp: 97.6 F (36.4 C) (04/16 2105) Temp Source: Oral (04/16 2105) BP: 85/69 (04/16 1600) Pulse Rate: 66 (04/16 2105)  Labs:  Recent Labs  02/16/17 0313 02/17/17 0253 02/17/17 1435 02/18/17 0001 02/18/17 0009  HGB 8.9* 8.4*  --   --  8.9*  HCT 27.2* 26.7*  --   --  27.7*  PLT 104* 117*  --   --  132*  LABPROT 16.8* 15.9*  --   --  17.3*  INR 1.35 1.26  --   --  1.40  HEPARINUNFRC 0.40 0.24* 0.27* 0.37  --   CREATININE 1.86* 1.73*  --   --  1.77*    Estimated Creatinine Clearance: 37.1 mL/min (A) (by C-G formula based on SCr of 1.77 mg/dL (H)).  Assessment: 71 y.o. male with Afib and LVAD, INR subtherapeutic, for heparin   Goal of Therapy:  Heparin level 0.3-0.7 units/ml Monitor platelets by anticoagulation protocol: Yes   Plan:  Continue Heparin at current rate  Follow-up am labs.   Phillis Knack, PharmD, BCPS  02/18/2017,1:12 AM

## 2017-02-19 LAB — GLUCOSE, CAPILLARY
GLUCOSE-CAPILLARY: 169 mg/dL — AB (ref 65–99)
GLUCOSE-CAPILLARY: 204 mg/dL — AB (ref 65–99)
GLUCOSE-CAPILLARY: 181 mg/dL — AB (ref 65–99)
Glucose-Capillary: 139 mg/dL — ABNORMAL HIGH (ref 65–99)

## 2017-02-19 LAB — BASIC METABOLIC PANEL
Anion gap: 8 (ref 5–15)
BUN: 34 mg/dL — AB (ref 6–20)
CALCIUM: 9.3 mg/dL (ref 8.9–10.3)
CHLORIDE: 95 mmol/L — AB (ref 101–111)
CO2: 31 mmol/L (ref 22–32)
CREATININE: 1.9 mg/dL — AB (ref 0.61–1.24)
GFR calc non Af Amer: 34 mL/min — ABNORMAL LOW (ref >60)
GFR, EST AFRICAN AMERICAN: 39 mL/min — AB (ref >60)
Glucose, Bld: 212 mg/dL — ABNORMAL HIGH (ref 65–99)
Potassium: 4.9 mmol/L (ref 3.5–5.1)
SODIUM: 134 mmol/L — AB (ref 135–145)

## 2017-02-19 LAB — HEPARIN LEVEL (UNFRACTIONATED): Heparin Unfractionated: 0.46 IU/mL (ref 0.30–0.70)

## 2017-02-19 LAB — CBC
HEMATOCRIT: 28.6 % — AB (ref 39.0–52.0)
Hemoglobin: 9.1 g/dL — ABNORMAL LOW (ref 13.0–17.0)
MCH: 30 pg (ref 26.0–34.0)
MCHC: 31.8 g/dL (ref 30.0–36.0)
MCV: 94.4 fL (ref 78.0–100.0)
Platelets: 148 10*3/uL — ABNORMAL LOW (ref 150–400)
RBC: 3.03 MIL/uL — ABNORMAL LOW (ref 4.22–5.81)
RDW: 19.9 % — AB (ref 11.5–15.5)
WBC: 8.4 10*3/uL (ref 4.0–10.5)

## 2017-02-19 LAB — LACTATE DEHYDROGENASE: LDH: 235 U/L — ABNORMAL HIGH (ref 98–192)

## 2017-02-19 LAB — PROTIME-INR
INR: 1.58
PROTHROMBIN TIME: 19 s — AB (ref 11.4–15.2)

## 2017-02-19 MED ORDER — AMLODIPINE BESYLATE 5 MG PO TABS
5.0000 mg | ORAL_TABLET | Freq: Every day | ORAL | Status: DC
  Administered 2017-02-19 – 2017-02-20 (×2): 5 mg via ORAL
  Filled 2017-02-19 (×2): qty 1

## 2017-02-19 MED ORDER — WARFARIN SODIUM 2.5 MG PO TABS
12.5000 mg | ORAL_TABLET | Freq: Once | ORAL | Status: AC
  Administered 2017-02-19: 12.5 mg via ORAL
  Filled 2017-02-19: qty 1

## 2017-02-19 MED ORDER — AMLODIPINE 1 MG/ML ORAL SUSPENSION: 2.5000 mg | Freq: Every day | ORAL | Status: DC

## 2017-02-19 MED ORDER — WARFARIN SODIUM 10 MG PO TABS: 10.0000 mg | ORAL_TABLET | Freq: Once | ORAL | Status: DC

## 2017-02-19 NOTE — Progress Notes (Signed)
LVAD Coordinator rounding note:  Admitted 4/9/18due to GI bleed. 3 units of PRBC's to date.  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 Prescott Gum.    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   Labs:  LDH trend:246>258>267>224>230>235  INR trend: 2.43>2.00>1.32>1.26>1.40>1.58  Anticoagulation Plan: -INR Goal: adjusted to 1.8-2.3 (restarted coumadin 4/13) -ASA Dose: 325 mg (on hold)  Adverse Events on VAD: -GIB with AVM   Plan/Recommendations:   1. Awaiting INR to become therapeutic. Pt ready for discharged.  Balinda Quails RN, VAD Coordinator 24/7 pager [phone]

## 2017-02-19 NOTE — Progress Notes (Signed)
Patient ID: Trevor [name], male   DOB: 1946-04-09, 71 y.o.   MRN: [id number] HeartMate 2 Rounding Note  Subjective:    Admitted with symptomatic anemia. Admit Hgb 6.5. So far he has received 4U PRBCs.   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   Todays hgb 9.1 . Yesterday he had 4 BMs. No BRBPR. Complaining of L foot pain.   Feeling ok. Denies SOB.    LVAD INTERROGATION:  HeartMate II LVAD:  Flow 5liters/min, speed 9000, power 5, PI 5.8 no PI events  Objective:    Vital Signs:   Temp:  [98.1 F (36.7 C)] 98.1 F (36.7 C) (04/17 1959) BP: (75-95)/(49-74) 95/74 (04/17 1600) SpO2:  [96 %] 96 % (04/17 1959) Last BM Date: 02/18/17 Mean arterial Pressure 90s     Intake/Output:   Intake/Output Summary (Last 24 hours) at 02/19/17 0713 Last data filed at 02/18/17 2345  Gross per 24 hour  Intake             1480 ml  Output             1850 ml  Net             -370 ml    Physical Exam: GENERAL: Well appearing, male in no acute distress. In bed  HEENT: normal  NECK: Supple, JVP 6-7 .  2+ bilaterally, no bruits.  No lymphadenopathy or thyromegaly appreciated.   CARDIAC:  Mechanical heart sounds with LVAD hum present.  LUNGS:  Clear to auscultation bilaterally.  ABDOMEN:  Soft, round, nontender, positive bowel sounds x4.     LVAD exit site: well-healed and incorporated.  Dressing dry and intact.  No erythema or drainage.  Stabilization device present and accurately applied.  Driveline dressing is being changed daily per sterile technique. EXTREMITIES:  Warm and dry, no cyanosis, clubbing, rash or Rand LLE trace edema.  L foot erythema 2x2 cm area.  NEUROLOGIC:  Alert and oriented x 4.  Gait steady.  No aphasia.  No dysarthria.  Affect pleasant.              Telemetry:  A fib 70-80s personally reviewed.     Labs: Basic Metabolic Panel:  Recent Labs Lab 02/15/17 0206 02/16/17 0313 02/17/17 0253 02/18/17 0009  02/19/17 0244  NA 138 136 134* 133* 134*  K 4.4 4.5 4.4 4.6 4.9  CL 101 101 95* 95* 95*  CO2 30 31 30 29 31   GLUCOSE 153* 152* 166* 195* 212*  BUN 30* 28* 30* 33* 34*  CREATININE 1.62* 1.86* 1.73* 1.77* 1.90*  CALCIUM 8.9 8.8* 8.7* 9.0 9.3    Liver Function Tests: No results for input(s): AST, ALT, ALKPHOS, BILITOT, PROT, ALBUMIN in the last 168 hours. No results for input(s): LIPASE, AMYLASE in the last 168 hours. No results for input(s): AMMONIA in the last 168 hours.  CBC:  Recent Labs Lab 02/13/17 2005  02/15/17 0206 02/15/17 1959 02/16/17 0313 02/17/17 0253 02/18/17 0009 02/19/17 0244  WBC 9.7  < > 8.6  --  8.1 6.8 8.6 8.4  NEUTROABS 6.6  --   --   --   --   --   --   --   HGB 8.8*  < > 7.7* 8.7* 8.9* 8.4* 8.9* 9.1*  HCT 26.7*  < > 24.6* 26.4* 27.2* 26.7* 27.7* 28.6*  MCV 94.0  < > 96.1  --  94.8 94.0 94.9 94.4  PLT 140*  < >  103*  --  104* 117* 132* 148*  < > = values in this interval not displayed.  INR:  Recent Labs Lab 02/15/17 0206 02/16/17 0313 02/17/17 0253 02/18/17 0009 02/19/17 0244  INR 1.45 1.35 1.26 1.40 1.58    Other results:  EKG:   Imaging: No results found.   Medications:     Scheduled Medications: . amLODipine  2.5 mg Oral Daily  . atorvastatin  40 mg Oral Daily  . busPIRone  5 mg Oral BID  . docusate sodium  200 mg Oral Daily  . gabapentin  300 mg Oral BID  . insulin aspart  0-15 Units Subcutaneous TID WC  . levothyroxine  25 mcg Oral QAC breakfast  . multivitamin with minerals  1 tablet Oral Daily  . pantoprazole  40 mg Oral Q1200  . potassium chloride  40 mEq Oral BID  . sildenafil  20 mg Oral TID  . sodium chloride flush  3 mL Intravenous Q12H  . spironolactone  25 mg Oral Daily  . torsemide  60 mg Oral Daily  . Warfarin - Pharmacist Dosing Inpatient   Does not apply q1800    Infusions: . sodium chloride    . sodium chloride    . heparin 1,450 Units/hr (02/18/17 1647)    PRN Medications: sodium chloride,  acetaminophen, albuterol, ondansetron (ZOFRAN) IV, sodium chloride flush, traMADol, traZODone   Assessment/Plan   1. GI bleeding: Has had 4 units PRBCs this admission.  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  Treated with APC and clipping with resolution of bleeding.   Hgb 9.1 . No bleeding problems.  - Start on octreotide as outpatient.   - INR rising to 1.58. Continue coumadin + heparin.  No asa with GI bleed.  - Pharmacy dosing and following closely. Heparin until INR 1.8 or greater.  2. Acute on chronic systolic CHF: Ischemic cardiomyopathy with RV failure. HMII LVAD for DT. Medtronic ICD.  Volume status stable. Continue current dose of torsemide and spiro.  - No beta blocker with RV failure, no ACEI/ARB/ARNI with elevated creatinine.  LDH stable. INR remains low.  3. CKD Stage III: Stable. Creatinine 1.9      4. Atrial fibrillation: Chronic. Rate controlled. Coumadin restarted.  5. HTN: Maps a little higher. Increase amlodipine to 5 mg daily  6. 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 Continue sildenafil.  7. L foot erythema- not sure cause. ? Cellulitis. Better today. WBC normal. Afebrile. Hold off on antibiotics.   Anticipate D/C likely Thursday.  I reviewed the LVAD parameters from today, and compared the results to the patient's prior recorded data.  No programming changes were made.  The LVAD is functioning within specified parameters.  The patient performs LVAD self-test daily.  LVAD interrogation was negative for any significant power changes, alarms or PI events/speed drops.  LVAD equipment check completed and is in good working order.  Back-up equipment present.   LVAD education done on emergency procedures and precautions and reviewed exit site care.  Length of Stay: [place] NP-C  02/19/2017, 7:13 AM  VAD Team --- VAD ISSUES ONLY--- Pager [phone] (7am - 7am)  Advanced Heart Failure Team  Pager [phone] (M-F; 7a - 4p)  Please contact [facility] for night-coverage after hours  (4p -7a ) and weekends on [email or web address]  Patient seen with NP, agree with the above note. INR 1.58 today, rising.  He had 4 BMs yesterday, 2 were dark but no overt blood.  Hemoglobin is rising.  Will need  octreotide as outpatient.   As above, keep here on heparin gtt until INR 1.8 or above.    He can drive when he goes home.   Volume status looks ok.   Increase amlodipine to 5 mg daily with elevated MAP.    Loralie Champagne  02/19/2017 1:38 PM

## 2017-02-19 NOTE — Discharge Summary (Signed)
Advanced Heart Failure Team  Discharge Summary   Patient ID: Trevor [name] MRN: [id number], DOB/AGE: [birth year] 72 y.o. Admit date: 02/10/2017 D/C date:     02/20/2017   Primary Discharge Diagnoses:  1. GI Bleed 02/14/2017 Colonoscopy--> AVM. APC and clip  2. A/C Systolic Heart Failure, HMII LVAD for DT  On chronic coumadin. INR goal  2-2.5 . No aspirin with GI bleed.  3. CKD Stage III-Creatinine baseline 1.7-1.9  4. Atrial Fib, Chronic 5. HTN 6. RV Failure 7. L foot erythema  Hospital Course:  Brasen Bundren Murphyis a 71 y.o.malewith a history of CAD s/p CABG x 4 2010, OSA, AS with TAVR 2015, CKD, DM2, paroxysmal atrial fibrillation, asthma, and ischemic cardiomyopathy with Medtronic ICD. He is S/P HMII LVAD for DT + TVR 12/11/2016. Hospitalization was complicated by RV failure  Admitted with increased dyspnea and fatigue in the setting of GI bleed. On admit hemoglobin was down to 6.5. GI consulted. Overall transfused 4UPRBCs. On April 13th he had colonoscopy with AVM identified. This was followed by APC and clipping.  Hgb stabilized at 9.4. Hospitalization was prolonged to allow INR to drift back up. He will continue to be followed closely in the LVAD clinic and has follow up next week. Plan to check INR at that time.   1. GI bleeding: Has had 4 units PRBCs this admission.  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  Treated with APC and clipping with resolution of bleeding.  No further bleeding problems. - Plan to start octreotide as outpatient. Has an appointment 4/26 for first injection.   No asa with GI bleed.  2. Acute on chronic systolic CHF: Ischemic cardiomyopathy with RV failure. HMII LVAD for DT. Medtronic ICD. Diuresed with IV lasix and transitioned to torsemide 60 mg daily. Continue spiro 25 mg daily.   No beta blocker with RV failure, no ACEI/ARB/ARNI with elevated creatinine.  LDH remained stable.  Continue coumadin with INR goal 2-2.5.  3. CKD Stage III: Remained  stable. Creatinine on the day of discharge was 1.9     4. Atrial fibrillation: Chronic. Rate controlled. Continue coumadin.   5. HTN: Meds adjusted to lower Maps.   6. 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 Continue sildenafil.  7. L foot erythema- not sure cause. ? Cellulitis. Better today. WBC normal. Afebrile. Hold off on antibiotics.     LVAD Interrogation HM II:   Speed:  9000   Flow: 4.7     PI: 6     Power: 5      Back-up speed:  9000     Discharge Weight: 169 pounds  Discharge Vitals: Blood pressure 96/80, pulse 66, temperature 98.1 F (36.7 C), temperature source Oral, resp. rate 18, height 5\' 5"  (1.651 m), weight 169 lb 1.6 oz (76.7 kg), SpO2 95 %.  Labs: Lab Results  Component Value Date   WBC 7.8 02/20/2017   HGB 9.4 (L) 02/20/2017   HCT 29.6 (L) 02/20/2017   MCV 94.0 02/20/2017   PLT 148 (L) 02/20/2017     Recent Labs Lab 02/20/17 0239  NA 133*  K 5.2*  CL 96*  CO2 28  BUN 35*  CREATININE 1.92*  CALCIUM 9.4  GLUCOSE 177*   Lab Results  Component Value Date   CHOL 59 12/04/2016   HDL 30 (L) 12/04/2016   LDLCALC 15 12/04/2016   TRIG 68 12/04/2016   BNP (last 3 results)  Recent Labs  11/28/16 1104 12/14/16 0348  BNP 1,825.3* 427.7*    ProBNP (  last 3 results)  Recent Labs  11/27/16 1207  PROBNP 1,746.0*     Diagnostic Studies/Procedures   No results found.  Discharge Medications   Allergies as of 02/20/2017   No Known Allergies     Medication List    STOP taking these medications   aspirin 325 MG EC tablet     TAKE these medications   albuterol 108 (90 Base) MCG/ACT inhaler Commonly known as:  PROVENTIL HFA;VENTOLIN HFA Inhale 2 puffs into the lungs every 4 (four) hours as needed for wheezing or shortness of breath.   albuterol (2.5 MG/3ML) 0.083% nebulizer solution Commonly known as:  PROVENTIL Take 3 mLs (2.5 mg total) by nebulization every 4 (four) hours. And as needed   amLODipine 2.5 MG tablet Commonly known as:  NORVASC Take 1 tablet  (2.5 mg total) by mouth daily. What changed:  Another medication with the same name was added. Make sure you understand how and when to take each.   amLODipine 5 MG tablet Commonly known as:  NORVASC Take 1 tablet (5 mg total) by mouth daily. Start taking on:  02/21/2017 What changed:  You were already taking a medication with the same name, and this prescription was added. Make sure you understand how and when to take each.   ascorbic acid 1000 MG tablet Commonly known as:  VITAMIN C Take 1,000 mg by mouth daily.   atorvastatin 40 MG tablet Commonly known as:  LIPITOR Take 1 tablet (40 mg total) by mouth daily.   benzonatate 100 MG capsule Commonly known as:  TESSALON Take 1 capsule (100 mg total) by mouth 3 (three) times daily as needed for cough.   busPIRone 5 MG tablet Commonly known as:  BUSPAR Take 1 tablet (5 mg total) by mouth 2 (two) times daily.   DOK 100 MG capsule Generic drug:  docusate sodium TAKE 2 CAPSULES(200 MG) BY MOUTH DAILY   fluticasone 110 MCG/ACT inhaler Commonly known as:  FLOVENT HFA Inhale 2 puffs into the lungs 2 (two) times daily.   gabapentin 600 MG tablet Commonly known as:  NEURONTIN Take 0.5 tablets (300 mg total) by mouth 2 (two) times daily.   insulin glargine 100 unit/mL Sopn Commonly known as:  LANTUS Inject 0.2 mLs (20 Units total) into the skin at bedtime.   levothyroxine 25 MCG tablet Commonly known as:  SYNTHROID, LEVOTHROID Take 1 tablet (25 mcg total) by mouth daily before breakfast.   multivitamin tablet Take 1 tablet by mouth daily.   pantoprazole 40 MG tablet Commonly known as:  PROTONIX Take 1 tablet (40 mg total) by mouth daily at 12 noon.   sildenafil 20 MG tablet Commonly known as:  REVATIO Take 1 tablet (20 mg total) by mouth 3 (three) times daily.   spironolactone 25 MG tablet Commonly known as:  ALDACTONE Take 0.5 tablets (12.5 mg total) by mouth daily.   torsemide 20 MG tablet Commonly known as:   DEMADEX Take 3 tablets (60 mg total) by mouth daily. What changed:  how much to take  when to take this   traMADol 50 MG tablet Commonly known as:  ULTRAM Take 1 tablet (50 mg total) by mouth every 6 (six) hours as needed for moderate pain.   traZODone 50 MG tablet Commonly known as:  DESYREL TAKE 1 TABLET(50 MG) BY MOUTH AT BEDTIME AS NEEDED FOR SLEEP   warfarin 5 MG tablet Commonly known as:  COUMADIN Take 2 tablets (10 mg total) by mouth daily. Start taking  on:  02/21/2017 What changed:  how much to take       Disposition   The patient will be discharged in stable condition to home. Discharge Instructions    (HEART FAILURE PATIENTS) Call MD:  Anytime you have any of the following symptoms: 1) 3 pound weight gain in 24 hours or 5 pounds in 1 week 2) shortness of breath, with or without a dry hacking cough 3) swelling in the hands, feet or stomach 4) if you have to sleep on extra pillows at night in order to breathe.    Complete by:  As directed    Diet - low sodium heart healthy    Complete by:  As directed    INR  Goal: 2 - 2.5    Complete by:  As directed    Goal:  2 - 2.5   Increase activity slowly    Complete by:  As directed    Page VAD Coordinator at [phone]  Notify for: any VAD alarms, sustained elevations of power >10 watts, sustained drop in Pulse Index <3    Complete by:  As directed    Notify for:   any VAD alarms sustained elevations of power >10 watts sustained drop in Pulse Index <3     Speed Settings:    Complete by:  As directed    Fixed 9000 RPM Low 8400 RPM     Follow-up Information    Loralie Champagne, MD Follow up on 02/26/2017.   Specialty:  Cardiology Why:  at 2:00 Garage Code 6000 Contact information: [address] [place] [address] [phone]             Duration of Discharge Encounter: Greater than 35 minutes   Signed, Amy Clegg  NP-C  02/20/2017, 1:37 PM

## 2017-02-19 NOTE — Progress Notes (Signed)
ANTICOAGULATION CONSULT NOTE - Follow Up Consult  Pharmacy Consult for Heparin (while INR is <1.8) and warfarin  Indication: Afib/LVAD  No Known Allergies  Patient Measurements: Height: 5\' 5"  (165.1 cm) Weight: 172 lb 14.4 oz (78.4 kg) IBW/kg (Calculated) : 61.5  Vital Signs: Temp: 98.1 F (36.7 C) (04/17 1959) Temp Source: Oral (04/17 1959)  Labs:  Recent Labs  02/17/17 0253 02/17/17 1435 02/18/17 0001 02/18/17 0009 02/19/17 0244  HGB 8.4*  --   --  8.9* 9.1*  HCT 26.7*  --   --  27.7* 28.6*  PLT 117*  --   --  132* 148*  LABPROT 15.9*  --   --  17.3* 19.0*  INR 1.26  --   --  1.40 1.58  HEPARINUNFRC 0.24* 0.27* 0.37  --  0.46  CREATININE 1.73*  --   --  1.77* 1.90*    Estimated Creatinine Clearance: 34.4 mL/min (A) (by C-G formula based on SCr of 1.9 mg/dL (H)).  Assessment: On heparin drip while INR is sub-therapeutic (<1.8).   Warfarin has been resumed. INR 1.58 this morning with slow trend up. No further bleeding issues noted. Hgb up to 9.1. Will give extra dose tonight but will need to likely back down on dosing at discharge to closer to his home dose or lower given new lower INR goal.    Will continue heparin, therapeutic on 1450 units/hr.  Goal of Therapy:  INR goal 1.8-2.3 Heparin level 0.3-0.5 units/ml Monitor platelets by anticoagulation protocol: Yes   Plan:  Continue heparin at 1450 units/hr Warfarin 12.5mg  Daily coags  Erin Hearing PharmD., BCPS Clinical Pharmacist Pager [phone] 02/19/2017 7:35 AM

## 2017-02-20 LAB — BASIC METABOLIC PANEL WITH GFR
Anion gap: 9 (ref 5–15)
BUN: 35 mg/dL — ABNORMAL HIGH (ref 6–20)
CO2: 28 mmol/L (ref 22–32)
Calcium: 9.4 mg/dL (ref 8.9–10.3)
Chloride: 96 mmol/L — ABNORMAL LOW (ref 101–111)
Creatinine, Ser: 1.92 mg/dL — ABNORMAL HIGH (ref 0.61–1.24)
GFR calc Af Amer: 39 mL/min — ABNORMAL LOW (ref >60)
GFR calc non Af Amer: 33 mL/min — ABNORMAL LOW (ref >60)
Glucose, Bld: 177 mg/dL — ABNORMAL HIGH (ref 65–99)
Potassium: 5.2 mmol/L — ABNORMAL HIGH (ref 3.5–5.1)
Sodium: 133 mmol/L — ABNORMAL LOW (ref 135–145)

## 2017-02-20 LAB — GLUCOSE, CAPILLARY
GLUCOSE-CAPILLARY: 144 mg/dL — AB (ref 65–99)
GLUCOSE-CAPILLARY: 191 mg/dL — AB (ref 65–99)
Glucose-Capillary: 143 mg/dL — ABNORMAL HIGH (ref 65–99)

## 2017-02-20 LAB — CBC
HEMATOCRIT: 29.6 % — AB (ref 39.0–52.0)
HEMOGLOBIN: 9.4 g/dL — AB (ref 13.0–17.0)
MCH: 29.8 pg (ref 26.0–34.0)
MCHC: 31.8 g/dL (ref 30.0–36.0)
MCV: 94 fL (ref 78.0–100.0)
Platelets: 148 10*3/uL — ABNORMAL LOW (ref 150–400)
RBC: 3.15 MIL/uL — ABNORMAL LOW (ref 4.22–5.81)
RDW: 19.5 % — ABNORMAL HIGH (ref 11.5–15.5)
WBC: 7.8 10*3/uL (ref 4.0–10.5)

## 2017-02-20 LAB — PROTIME-INR
INR: 1.7
INR: 1.71
Prothrombin Time: 20.2 seconds — ABNORMAL HIGH (ref 11.4–15.2)
Prothrombin Time: 20.3 s — ABNORMAL HIGH (ref 11.4–15.2)

## 2017-02-20 LAB — LACTATE DEHYDROGENASE: LDH: 211 U/L — ABNORMAL HIGH (ref 98–192)

## 2017-02-20 LAB — HEPARIN LEVEL (UNFRACTIONATED): Heparin Unfractionated: 0.53 [IU]/mL (ref 0.30–0.70)

## 2017-02-20 MED ORDER — WARFARIN SODIUM 5 MG PO TABS: 10.0000 mg | ORAL_TABLET | Freq: Every day | ORAL | 6 refills | Status: DC

## 2017-02-20 MED ORDER — AMLODIPINE BESYLATE 5 MG PO TABS: 5.0000 mg | ORAL_TABLET | Freq: Every day | ORAL | 6 refills | Status: DC

## 2017-02-20 MED ORDER — WARFARIN SODIUM 2.5 MG PO TABS
12.5000 mg | ORAL_TABLET | ORAL | Status: AC
  Administered 2017-02-20: 12.5 mg via ORAL
  Filled 2017-02-20: qty 1

## 2017-02-20 NOTE — Progress Notes (Signed)
Patient ID: Trevor [name], male   DOB: 1946-09-30, 71 y.o.   MRN: [id number] HeartMate 2 Rounding Note  Subjective:    Admitted with symptomatic anemia. Admit Hgb 6.5. So far he has received 4U PRBCs.   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   Hgb up to 9.4.   INR up to 1.7  Denies SOB. Wants to go home.   LVAD INTERROGATION:  HeartMate II LVAD:  Flow 4.7 liters/min, speed 9000, power5, PI 6 no PI events  Objective:    Vital Signs:   Temp:  [97.8 F (36.6 C)-98.1 F (36.7 C)] 98.1 F (36.7 C) (04/19 0408) BP: (91-102)/(59-80) 93/79 (04/19 0400) SpO2:  [95 %-100 %] 95 % (04/19 0400) Weight:  [169 lb 1.6 oz (76.7 kg)-170 lb 3.2 oz (77.2 kg)] 169 lb 1.6 oz (76.7 kg) (04/19 0600) Last BM Date: 02/19/17 Mean arterial Pressure 90s . Modified Systolic.    Intake/Output:   Intake/Output Summary (Last 24 hours) at 02/20/17 0724 Last data filed at 02/20/17 0647  Gross per 24 hour  Intake             1316 ml  Output             2425 ml  Net            -1109 ml      Physical Exam:  Physical Exam: GENERAL: Well appearing, male. In bed.  HEENT: normal  NECK: Supple, JVP 6-7  .  2+ bilaterally, no bruits.  No lymphadenopathy or thyromegaly appreciated.   CARDIAC:  Mechanical heart sounds with LVAD hum present.  LUNGS:  Clear to auscultation bilaterally.  ABDOMEN:  Soft, round, nontender, positive bowel sounds x4.     LVAD exit site: well-healed and incorporated.  Dressing dry and intact.  No erythema or drainage.  Stabilization device present and accurately applied.  Driveline dressing is being changed daily per sterile technique. EXTREMITIES:  Warm and dry, no cyanosis, clubbing, rash or trace edema  NEUROLOGIC:  Alert and oriented x 4.  Gait steady.  No aphasia.  No dysarthria.  Affect pleasant.           Telemetry:  A fib 60-70s.    Labs: Basic Metabolic Panel:  Recent Labs Lab 02/16/17 0313 02/17/17 0253  02/18/17 0009 02/19/17 0244 02/20/17 0239  NA 136 134* 133* 134* 133*  K 4.5 4.4 4.6 4.9 5.2*  CL 101 95* 95* 95* 96*  CO2 31 30 29 31 28   GLUCOSE 152* 166* 195* 212* 177*  BUN 28* 30* 33* 34* 35*  CREATININE 1.86* 1.73* 1.77* 1.90* 1.92*  CALCIUM 8.8* 8.7* 9.0 9.3 9.4    Liver Function Tests: No results for input(s): AST, ALT, ALKPHOS, BILITOT, PROT, ALBUMIN in the last 168 hours. No results for input(s): LIPASE, AMYLASE in the last 168 hours. No results for input(s): AMMONIA in the last 168 hours.  CBC:  Recent Labs Lab 02/13/17 2005  02/16/17 0313 02/17/17 0253 02/18/17 0009 02/19/17 0244 02/20/17 0239  WBC 9.7  < > 8.1 6.8 8.6 8.4 7.8  NEUTROABS 6.6  --   --   --   --   --   --   HGB 8.8*  < > 8.9* 8.4* 8.9* 9.1* 9.4*  HCT 26.7*  < > 27.2* 26.7* 27.7* 28.6* 29.6*  MCV 94.0  < > 94.8 94.0 94.9 94.4 94.0  PLT 140*  < > 104* 117* 132*  148* 148*  < > = values in this interval not displayed.  INR:  Recent Labs Lab 02/16/17 0313 02/17/17 0253 02/18/17 0009 02/19/17 0244 02/20/17 0239  INR 1.35 1.26 1.40 1.58 1.70    Other results:  EKG:   Imaging: No results found.   Medications:     Scheduled Medications: . amLODipine  5 mg Oral Daily  . atorvastatin  40 mg Oral Daily  . busPIRone  5 mg Oral BID  . docusate sodium  200 mg Oral Daily  . gabapentin  300 mg Oral BID  . insulin aspart  0-15 Units Subcutaneous TID WC  . levothyroxine  25 mcg Oral QAC breakfast  . multivitamin with minerals  1 tablet Oral Daily  . pantoprazole  40 mg Oral Q1200  . potassium chloride  40 mEq Oral BID  . sildenafil  20 mg Oral TID  . sodium chloride flush  3 mL Intravenous Q12H  . spironolactone  25 mg Oral Daily  . torsemide  60 mg Oral Daily  . Warfarin - Pharmacist Dosing Inpatient   Does not apply q1800    Infusions: . sodium chloride    . sodium chloride    . heparin 1,450 Units/hr (02/20/17 0717)    PRN Medications: sodium chloride, acetaminophen,  albuterol, ondansetron (ZOFRAN) IV, sodium chloride flush, traMADol, traZODone   Assessment/Plan   1. GI bleeding: Has had 4 units PRBCs this admission.  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  Treated with APC and clipping with resolution of bleeding.   Todays hgb is 9.4.  No bleeding problems.  - Start on octreotide as outpatient.  Set up for first injection 02/17/2017  - INR up to 1.7. Continue coumadin and heparin drip.  He will stay off ASA.  - Pharmacy dosing and following closely. Heparin until INR 1.8 or greater.  2. Acute on chronic systolic CHF: Ischemic cardiomyopathy with RV failure. HMII LVAD for DT. Medtronic ICD.  Volume status stable. Continue torsemide and spiro at current dose.   - No beta blocker with RV failure, no ACEI/ARB/ARNI with elevated creatinine.  LDH stable. INR remains low.  3. CKD Stage III: Stable. Creatinine up to 1.9.      4. Atrial fibrillation: Chronic.Rate controlled. On coumadin.   5. HTN: Stable MAP in the 80s.   6. 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 Continue sildenafil.   I reviewed the LVAD parameters from today, and compared the results to the patient's prior recorded data.  No programming changes were made.  The LVAD is functioning within specified parameters.  The patient performs LVAD self-test daily.  LVAD interrogation was negative for any significant power changes, alarms or PI events/speed drops.  LVAD equipment check completed and is in good working order.  Back-up equipment present.   LVAD education done on emergency procedures and precautions and reviewed exit site care.  Length of Stay: [place] NP-C  02/20/2017, 7:24 AM  VAD Team --- VAD ISSUES ONLY--- Pager [phone] (7am - 7am)  Advanced Heart Failure Team  Pager [phone] (M-F; 7a - 4p)  Please contact [facility] for night-coverage after hours (4p -7a ) and weekends on [email or web address]  Patient seen with NP, agree with the above note.  INR 1.71 currently, hemoglobin up to 9.4.  I will let  him go home today.  We will give him a higher dose of coumadin tonight and check INR early next week.  Keep off aspirin, aim for INR 2-2.5.   K is high  today after increasing spironolactone.  Stop KCl supplement (was not on KCl at home).   Cardiac meds for discharge:  Warfarin INR 2-2.5 (stop aspirin) Amlodipine 5 mg daily Atorvastatin 40 daily Torsemide 60 daily Sildenafil 20 mg tid Spironolactone 25 mg daily  SEt up for octreotide as outpatient.   Loralie Champagne 02/20/2017 1:25 PM

## 2017-02-20 NOTE — Discharge Instructions (Addendum)

## 2017-02-20 NOTE — Progress Notes (Signed)
Pt discharged home per MD order. IV and telemetry box removed. Pt received discharge instructions and all questions were answered. Pt left with all of his belongings, including VAD batteries. Pt left the unit via wheelchair and was accompanied by this RN.   Grant Fontana BSN, RN

## 2017-02-20 NOTE — Progress Notes (Addendum)
ANTICOAGULATION CONSULT NOTE - Follow Up Consult  Pharmacy Consult for Heparin (while INR is <1.8) and warfarin  Indication: Afib/LVAD  No Known Allergies  Patient Measurements: Height: 5\' 5"  (165.1 cm) Weight: 169 lb 1.6 oz (76.7 kg) IBW/kg (Calculated) : 61.5  Vital Signs: Temp: 98.1 F (36.7 C) (04/19 0408) Temp Source: Oral (04/19 0408) BP: 93/79 (04/19 0400)  Labs:  Recent Labs  02/18/17 0001  02/18/17 0009 02/19/17 0244 02/20/17 0239  HGB  --   < > 8.9* 9.1* 9.4*  HCT  --   --  27.7* 28.6* 29.6*  PLT  --   --  132* 148* 148*  LABPROT  --   --  17.3* 19.0* 20.2*  INR  --   --  1.40 1.58 1.70  HEPARINUNFRC 0.37  --   --  0.46 0.53  CREATININE  --   --  1.77* 1.90* 1.92*  < > = values in this interval not displayed.  Estimated Creatinine Clearance: 33.7 mL/min (A) (by C-G formula based on SCr of 1.92 mg/dL (H)).  Assessment: On heparin drip while INR is sub-therapeutic (<1.8).   Warfarin has been resumed. INR 1.7 this morning with slow trend up requiring higher doses than PTA.  No further bleeding issues noted. Hgb up to 9.1.  Plan is to continue previous INR goal 2-2.5 and stop ASA  Will continue heparin drip 1450 units/hr with therapeutic HL 0.46.  Goal of Therapy:  INR goal 2-2.5 Heparin level 0.3-0.5 units/ml Monitor platelets by anticoagulation protocol: Yes   Plan:  Continue heparin at 1450 units/hr Warfarin 12.5mg  repeat again tonight and d/c home on 10mg  daily until next INR check   [name] Pharm.D. CPP, BCPS Clinical Pharmacist [phone] 02/20/2017 10:55 AM

## 2017-02-20 NOTE — Progress Notes (Signed)
LVAD Coordinator rounding note:  Admitted 4/9/18due to GI bleed. 3 units of PRBC's to date.  HeartMate II LVAD implanted on 2/9/18as DT VAD by Dr. Prescott Gum.    Colonoscopy 4/13> AVM in colon> clips x2  Plan to start Octreotide on OP basis. First appt 02/27/17 at 11:00 am in Medical Day.   Vital signs: HR: not recorded Doppler Pressure: 86 Automatic BP: 93/79 (66) O2 Sat: 95% RA Wt:76.7 kg/ 169 lbs   LVAD interrogation reveals:  Speed:9000 Flow: 5.1 Power: 5.4 PI: 6.0 Alarms: none Events: none Fixed speed: 9000 Low speed limit: 8400   Drive Line: Changed today with small amount bloody drainage with silver strip removal. VAD coordinator to change dressing tomorrow prior to discharge to evaluate site.   Labs:  LDH trend:246>258>267>224>230>235>211  INR trend: 2.43>2.00>1.32>1.26>1.40>1.58>1.70  Anticoagulation Plan: -INR Goal: adjusted to 1.8-2.3 (restarted coumadin 4/13) -ASA Dose: 325 mg (on hold)  Adverse Events on VAD: -GIB with AVM   Plan/Recommendations:   1. Awaiting INR to become therapeutic. Anticipated discharge tomorrow.  2. First OP Octreotide shot scheduled 02/27/17 at 11:00 in Medical Day.  [name] RN, VAD Coordinator 24/7 pager [phone]

## 2017-02-21 ENCOUNTER — Other Ambulatory Visit (HOSPITAL_COMMUNITY): Payer: Self-pay | Admitting: Pharmacist

## 2017-02-21 DIAGNOSIS — K5521 Angiodysplasia of colon with hemorrhage: Secondary | ICD-10-CM

## 2017-02-21 MED ORDER — OCTREOTIDE ACETATE 20 MG IM KIT
20.0000 mg | PACK | Freq: Once | INTRAMUSCULAR | Status: DC
Start: 2017-02-27 — End: 2017-02-21
  Filled 2017-02-21: qty 1

## 2017-02-26 ENCOUNTER — Ambulatory Visit (HOSPITAL_COMMUNITY): Payer: Self-pay | Admitting: Pharmacist

## 2017-02-26 ENCOUNTER — Other Ambulatory Visit (HOSPITAL_COMMUNITY): Payer: Self-pay | Admitting: *Deleted

## 2017-02-26 ENCOUNTER — Ambulatory Visit (HOSPITAL_COMMUNITY)
Admission: RE | Admit: 2017-02-26 | Discharge: 2017-02-26 | Disposition: A | Discharge status: Home / Self Care | Payer: Medicare Other | Source: Ambulatory Visit | Attending: Cardiology | Admitting: Cardiology

## 2017-02-26 ENCOUNTER — Telehealth (HOSPITAL_COMMUNITY): Payer: Self-pay

## 2017-02-26 DIAGNOSIS — I255 Ischemic cardiomyopathy: Secondary | ICD-10-CM | POA: Insufficient documentation

## 2017-02-26 DIAGNOSIS — Z7901 Long term (current) use of anticoagulants: Secondary | ICD-10-CM | POA: Insufficient documentation

## 2017-02-26 DIAGNOSIS — I5023 Acute on chronic systolic (congestive) heart failure: Secondary | ICD-10-CM

## 2017-02-26 DIAGNOSIS — E1122 Type 2 diabetes mellitus with diabetic chronic kidney disease: Secondary | ICD-10-CM | POA: Diagnosis not present

## 2017-02-26 DIAGNOSIS — Z951 Presence of aortocoronary bypass graft: Secondary | ICD-10-CM | POA: Insufficient documentation

## 2017-02-26 DIAGNOSIS — I50810 Right heart failure, unspecified: Secondary | ICD-10-CM

## 2017-02-26 DIAGNOSIS — I2510 Atherosclerotic heart disease of native coronary artery without angina pectoris: Secondary | ICD-10-CM | POA: Diagnosis not present

## 2017-02-26 DIAGNOSIS — Z794 Long term (current) use of insulin: Secondary | ICD-10-CM | POA: Insufficient documentation

## 2017-02-26 DIAGNOSIS — Z79899 Other long term (current) drug therapy: Secondary | ICD-10-CM | POA: Diagnosis not present

## 2017-02-26 DIAGNOSIS — I5022 Chronic systolic (congestive) heart failure: Secondary | ICD-10-CM | POA: Insufficient documentation

## 2017-02-26 DIAGNOSIS — N183 Chronic kidney disease, stage 3 unspecified: Secondary | ICD-10-CM

## 2017-02-26 DIAGNOSIS — I509 Heart failure, unspecified: Secondary | ICD-10-CM

## 2017-02-26 DIAGNOSIS — I130 Hypertensive heart and chronic kidney disease with heart failure and stage 1 through stage 4 chronic kidney disease, or unspecified chronic kidney disease: Secondary | ICD-10-CM | POA: Diagnosis not present

## 2017-02-26 DIAGNOSIS — Z8719 Personal history of other diseases of the digestive system: Secondary | ICD-10-CM

## 2017-02-26 DIAGNOSIS — I480 Paroxysmal atrial fibrillation: Secondary | ICD-10-CM | POA: Insufficient documentation

## 2017-02-26 DIAGNOSIS — Z95811 Presence of heart assist device: Secondary | ICD-10-CM

## 2017-02-26 DIAGNOSIS — D638 Anemia in other chronic diseases classified elsewhere: Secondary | ICD-10-CM | POA: Diagnosis not present

## 2017-02-26 DIAGNOSIS — G4733 Obstructive sleep apnea (adult) (pediatric): Secondary | ICD-10-CM | POA: Diagnosis not present

## 2017-02-26 DIAGNOSIS — I482 Chronic atrial fibrillation: Secondary | ICD-10-CM | POA: Insufficient documentation

## 2017-02-26 LAB — BASIC METABOLIC PANEL
Anion gap: 11 (ref 5–15)
BUN: 61 mg/dL — ABNORMAL HIGH (ref 6–20)
CO2: 26 mmol/L (ref 22–32)
Calcium: 9 mg/dL (ref 8.9–10.3)
Chloride: 96 mmol/L — ABNORMAL LOW (ref 101–111)
Creatinine, Ser: 2.21 mg/dL — ABNORMAL HIGH (ref 0.61–1.24)
GFR calc Af Amer: 33 mL/min — ABNORMAL LOW (ref >60)
GFR calc non Af Amer: 28 mL/min — ABNORMAL LOW (ref >60)
Glucose, Bld: 114 mg/dL — ABNORMAL HIGH (ref 65–99)
Potassium: 4 mmol/L (ref 3.5–5.1)
Sodium: 133 mmol/L — ABNORMAL LOW (ref 135–145)

## 2017-02-26 LAB — CBC
HEMATOCRIT: 27.6 % — AB (ref 39.0–52.0)
HEMOGLOBIN: 9.4 g/dL — AB (ref 13.0–17.0)
MCH: 31.4 pg (ref 26.0–34.0)
MCHC: 34.1 g/dL (ref 30.0–36.0)
MCV: 92.3 fL (ref 78.0–100.0)
Platelets: 147 10*3/uL — ABNORMAL LOW (ref 150–400)
RBC: 2.99 MIL/uL — AB (ref 4.22–5.81)
RDW: 18.9 % — ABNORMAL HIGH (ref 11.5–15.5)
WBC: 8.8 10*3/uL (ref 4.0–10.5)

## 2017-02-26 LAB — PROTIME-INR
INR: 2.43
Prothrombin Time: 26.9 seconds — ABNORMAL HIGH (ref 11.4–15.2)

## 2017-02-26 LAB — LACTATE DEHYDROGENASE: LDH: 252 U/L — ABNORMAL HIGH (ref 98–192)

## 2017-02-26 MED ORDER — TORSEMIDE 20 MG PO TABS: 20.0000 mg | ORAL_TABLET | Freq: Three times a day (TID) | ORAL | 5 refills | Status: DC

## 2017-02-26 MED ORDER — DOXYCYCLINE HYCLATE 50 MG PO CAPS: 50.0000 mg | ORAL_CAPSULE | Freq: Two times a day (BID) | ORAL | 0 refills | Status: DC

## 2017-02-26 MED ORDER — OCTREOTIDE ACETATE 20 MG IM KIT: 20.0000 mg | PACK | INTRAMUSCULAR | Status: DC

## 2017-02-26 NOTE — Telephone Encounter (Signed)
Short Stay at [facility] calling to get orders placed for patient's upcoming apt tomorrow to get Octreotide injection. Will forward to VAD Coordinators to address.  Renee Pain, RN

## 2017-02-26 NOTE — Progress Notes (Addendum)
Patient presents for hospital discharge  follow up in [facility] today. Reports no problems with VAD equipment or concerns with drive line.  State she feels really good since leaving the hospital.  Vital Signs:  Doppler Pressure 70   Automatc BP: 90/73 (78) HR:111   SPO2:95  %  Weight: 174.6 lb w/o eqt Last weight: 169  Lb at discharge Home weights: 170 lbs   VAD Indication: Destination Therapy    VAD interrogation & Equipment Management (reviewed with [name]): Speed:9000 Flow: 5.4 Power:5.3 w    PI:5.4  Alarms: no clinical alarms Events: none  Fixed speed 9000 Low speed limit: 8400  Primary Controller:  Replace back up battery in 71months. Back up controller:   Replace back up battery in 20 months.  Annual Equipment Maintenance on UBC/PM was performed on 12/2016.   I reviewed the LVAD parameters from today and compared the results to the patient's prior recorded data. LVAD interrogation was NEGATIVE for significant power changes, NEGATIVE for clinical alarms and STABLE for PI events/speed drops. No programming changes were made and pump is functioning within specified parameters. Pt is performing daily controller and system monitor self tests along with completing weekly and monthly maintenance for LVAD equipment.  LVAD equipment check completed and is in good working order. Back-up equipment present. Charged back up battery and performed self-test on equipment.   Exit Site Care: Drive line is being maintained every 3 days  by Trevor [name]. Drive line exit site healed and incorporated. Large red scab noted at exit site. The velour is fully implanted at exit site. Dressing dry and intact. No erythema or drainage. Stabilization device present and accurately applied. Pt denies fever or chills. Pt states they have adequate dressing supplies at home.     Significant Events on VAD Support:  02/2017> GIB- AVM clipped x2  Device:Medtronic Therapies:  on Last check: 02/2017   BP & Labs:  MAP70 - Doppler is reflecting MAP  Hgb 9.4 - No S/S of bleeding. Specifically denies melena/BRBPR or nosebleeds.  LDH stable at 252 with established baseline of 200- 270. Denies tea-colored urine. No power elevations noted on interrogation.   Plan- 1. Refer to cardiac rehab 2. Start doxy x7 days for cellulitis of left foot. 3. Return to clinic in 3 weeks.   Trevor [name] VAD Coordinator   Office: [phone] 24/7 Emergency VAD Pager: [phone]    Cardiology: Dr. [name] a 71 y.o.malewith a history of CAD s/p CABG x 4 2010, OSA, AS with TAVR 2015, CKD, DM2, paroxysmal atrial fibrillation, asthma, and ischemic cardiomyopathy with Medtronic ICD.   Relocated to [place] from [place] in early 2018. Over the last couple of years, Trevor [name] had been admitted multiple times for CHF. Last admission in [place] was in 1/18. Sounds like the hospitalization was complicated by cardiorenal syndrome and there was consideration of doing dialysis.  Trevor [name] was admitted in to [facility] 11/27/16 with NYHA class IV symptoms and hypotension. Trevor [name] was placed on dual inotropes to facilitate diuresis, cardiac output and renal function.  CT surgery consulted for mechanical support and Trevor [name] was deemed appropriate for LVAD work up. Trevor [name] completed LVAD work up and was approved for HMII LVAD --> DT. On 12/11/16, Trevor [name] had IABP placed to optimize cardiac output and improve renal function. Trevor [name] then underwent HMII LVAD placement + tricuspid valve repair on 12/14/2015. Post operatively pressors weaned off slowly. Trevor [name] required significant diuresis.  Course was complicated by  RV dysfunction and renal dysfunction.  Patient was admitted with lower GI bleeding in 4/18.  Trevor [name] had been on ASA 325 (increased with mild LDH elevation) and warfarin INR 2-2.5.  Trevor [name] had 4 units PRBCs total.  Colonoscopy showed colonic AVM treated with APC and clipping.  Trevor [name] was sent home off ASA and  on warfarin INR goal 2-2.5.   Trevor [name] has done well since discharge.  Weight is down since last appointment.  Trevor [name] has had no further BRBPR or melena. Hemoglobin today was stable compared to discharge.  Trevor [name] will be getting his first octreotide infusion this week. MAP in the 70s today.  No significant exertional dyspnea. Trevor [name] is driving again.  Lower extremity edema is improved.  Trevor [name] still has an area of redness dorsal foot proximal to toes, not at a joint. It is unchanged from when Trevor [name] was in the hospital.  Mildly tender.   Labs (3/18): LDH 331 => 401 => 443 => 366 => 313, K 3.6 => 3.2 => 3.5, creatinine 1.98 => 1.74 => 1.8 => 1.79, hgb 8.3 => 8.9 => 8.7 => 8.4, INR 2.11 => 2.4 Labs (4/18): hgb 9.4  PMH: 1. CAD: s/p CABG in 2010.  2. Aortic stenosis: s/p TAVR in 2015. Valve looked ok on 2/18 echo.  3. Atrial fibrillation: Chronic.  4. Type II diabetes.  5. CKD: Stage III, cardiorenal syndrome.  6. Chronic systolic CHF: Ischemic cardiomyopathy with prominent RV dysfunction.  Medtronic ICD.  - Echo (2/18): EF 20-25%, moderately dilated LV, moderately dilated/moderately dysfunctional RV, severe TR.  - Cardiogenic shock 2/18 requiring milrinone, norepinephrine, and IABP.  - Heartmate II LVAD for DT placed 12/13/16.   7. OSA.  8. Thrombocytopenia: Post-op LVAD, resolved.  9. Tricuspid regurgitation: Severe, s/p repair with LVAD placement in 2/18.  10. Lower GI bleed (4/18): Colonic AVM, treated with APC and clipping.   Social History   Social History  . Marital status: Widowed    Spouse name: N/A  . Number of children: 0  . Years of education: 14   Occupational History  . Not on file.   Social History Main Topics  . Smoking status: Never Smoker  . Smokeless tobacco: Never Used  . Alcohol use No  . Drug use: No  . Sexual activity: Not Currently   Other Topics Concern  . Not on file   Social History Narrative   Patient is a widow. Moved to [place] from [place], [place]. Currently lives  with his sister Trevor [name].    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    Family History  Problem Relation Age of Onset  . Heart failure Father   . Heart attack Father   . Healthy Mother   . Diabetes Paternal Grandfather    ROS: All systems reviewed and negative except as per HPI.   Current Outpatient Prescriptions  Medication Sig Dispense Refill  . albuterol (PROVENTIL HFA;VENTOLIN HFA) 108 (90 Base) MCG/ACT inhaler Inhale 2 puffs into the lungs every 4 (four) hours as needed for wheezing or shortness of breath. 1 Inhaler 5  . amLODipine (NORVASC) 5 MG tablet Take 1 tablet (5 mg total) by mouth daily. 30 tablet 6  . ascorbic acid (VITAMIN C) 1000 MG tablet Take 1,000 mg by mouth daily.    Marland Kitchen atorvastatin (LIPITOR) 40 MG tablet Take 1 tablet (40 mg total) by mouth daily. 30 tablet 6  . benzonatate (TESSALON) 100 MG capsule Take 1 capsule (100 mg total) by mouth 3 (  three) times daily as needed for cough. 20 capsule 6  . busPIRone (BUSPAR) 5 MG tablet Take 1 tablet (5 mg total) by mouth 2 (two) times daily. 60 tablet 6  . DOK 100 MG capsule TAKE 2 CAPSULES(200 MG) BY MOUTH DAILY 30 capsule 3  . fluticasone (FLOVENT HFA) 110 MCG/ACT inhaler Inhale 2 puffs into the lungs 2 (two) times daily. 1 Inhaler 5  . gabapentin (NEURONTIN) 600 MG tablet Take 0.5 tablets (300 mg total) by mouth 2 (two) times daily. 60 tablet 6  . insulin glargine (LANTUS) 100 unit/mL SOPN Inject 0.2 mLs (20 Units total) into the skin at bedtime. 15 mL 0  . levothyroxine (SYNTHROID, LEVOTHROID) 25 MCG tablet Take 1 tablet (25 mcg total) by mouth daily before breakfast. 30 tablet 6  . Multiple Vitamin (MULTIVITAMIN) tablet Take 1 tablet by mouth daily.    . pantoprazole (PROTONIX) 40 MG tablet Take 1 tablet (40 mg total) by mouth daily at 12 noon. 30 tablet 6  . sildenafil (REVATIO) 20 MG tablet Take 1 tablet (20 mg total) by mouth 3 (three) times daily. 90 tablet 6  . spironolactone (ALDACTONE) 25 MG tablet Take  0.5 tablets (12.5 mg total) by mouth daily. 30 tablet 6  . traMADol (ULTRAM) 50 MG tablet Take 1 tablet (50 mg total) by mouth every 6 (six) hours as needed for moderate pain. 30 tablet 0  . traZODone (DESYREL) 50 MG tablet TAKE 1 TABLET(50 MG) BY MOUTH AT BEDTIME AS NEEDED FOR SLEEP 30 tablet 3  . warfarin (COUMADIN) 5 MG tablet Take 2 tablets (10 mg total) by mouth daily. 45 tablet 6  . albuterol (PROVENTIL) (2.5 MG/3ML) 0.083% nebulizer solution Take 3 mLs (2.5 mg total) by nebulization every 4 (four) hours. And as needed (Patient not taking: Reported on 02/13/2017) 150 mL 5  . doxycycline (VIBRAMYCIN) 50 MG capsule Take 1 capsule (50 mg total) by mouth 2 (two) times daily. 14 capsule 0  . torsemide (DEMADEX) 20 MG tablet Take 1 tablet (20 mg total) by mouth 3 (three) times daily. 90 tablet 5   No current facility-administered medications for this encounter.    There were no vitals taken for this visit. General: NAD Neck: JVP 8 cm, no thyromegaly or thyroid nodule.  Lungs: CTAB  CV: LVAD hum.  Trace ankle edema.  No carotid bruit.   Abdomen: Soft, nontender, no hepatosplenomegaly, no distention.  Skin: Intact without lesions or rashes.  Neurologic: Alert and oriented x 3.  Psych: Normal affect. Extremities: No clubbing or cyanosis. Right foot with dorsal surface small area of erythema/tenderness.  HEENT: Normal.   Assessment/Plan: 1. Chronic systolic CHF: Ischemic cardiomyopathy, EF 20-25% with RV dysfunction on 2/18 echo pre-LVAD.  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  Medtronic ICD.  Trevor [name] feels much better post-LVAD, NYHA class II. Weight is down.  LVAD parameters reviewed today and stable.  MAP in 70s.   - Continue torsemide 60 mg daily.   - Continue warfarin, no ASA with recent GI bleeding.  - Trevor [name] will be starting cardiac rehab.     2. CAD: s/p CABG.  No chest pain.  Trevor [name] is on atorvastatin.  3. CKD: Stage III.  Check creatinine today.  Trevor [name] is seeing Dr. Hollie Salk for nephrology.  4. Atrial  fibrillation: Chronic.  On warfarin.  5. 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  6. HTN: Controlled.  Continue amlodipine and spironolactone.  7. Anemia: Stable, baseline anemia of renal disease/chronic disease but recent bleeding from  colonic AVMs.  8. 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  - Trevor [name] will start octreotide injections.  9. ?Cellulitis right foot: Will give course of doxycycline.   Trevor [name] 02/27/2017

## 2017-02-27 ENCOUNTER — Encounter (HOSPITAL_COMMUNITY)
Admission: RE | Admit: 2017-02-27 | Discharge: 2017-02-27 | Disposition: A | Discharge status: Home / Self Care | Payer: Medicare Other | Source: Ambulatory Visit | Attending: Cardiology | Admitting: Cardiology

## 2017-02-27 ENCOUNTER — Telehealth (HOSPITAL_COMMUNITY): Payer: Self-pay | Admitting: *Deleted

## 2017-02-27 ENCOUNTER — Other Ambulatory Visit (HOSPITAL_COMMUNITY): Payer: Self-pay | Admitting: Cardiology

## 2017-02-27 DIAGNOSIS — Z95811 Presence of heart assist device: Secondary | ICD-10-CM | POA: Diagnosis not present

## 2017-02-27 DIAGNOSIS — I509 Heart failure, unspecified: Secondary | ICD-10-CM

## 2017-02-27 MED ORDER — OCTREOTIDE ACETATE 30 MG IM KIT
30.0000 mg | PACK | Freq: Once | INTRAMUSCULAR | Status: AC
  Administered 2017-02-27: 11:00:00 30 mg via INTRAMUSCULAR
  Filled 2017-02-27: qty 1

## 2017-02-27 NOTE — Discharge Instructions (Signed)
Octreotide injection solution °What is this medicine? °OCTREOTIDE (ok TREE oh tide) is used to reduce blood levels of growth hormone in patients with a condition called acromegaly. This medicine also reduces flushing and watery diarrhea caused by certain types of cancer. °This medicine may be used for other purposes; ask your health care provider or pharmacist if you have questions. °COMMON BRAND NAME(S): Sandostatin, Sandostatin LAR °What should I tell my health care provider before I take this medicine? °They need to know if you have any of these conditions: °-gallbladder disease °-kidney disease °-liver disease °-an unusual or allergic reaction to octreotide, other medicines, foods, dyes, or preservatives °-pregnant or trying to get pregnant °-breast-feeding °How should I use this medicine? °This medicine is for injection under the skin or into a vein (only in emergency situations). It is usually given by a health care professional in a hospital or clinic setting. °If you get this medicine at home, you will be taught how to prepare and give this medicine. Allow the injection solution to come to room temperature before use. Do not warm it artificially. Use exactly as directed. Take your medicine at regular intervals. Do not take your medicine more often than directed. °It is important that you put your used needles and syringes in a special sharps container. Do not put them in a trash can. If you do not have a sharps container, call your pharmacist or healthcare provider to get one. °Talk to your pediatrician regarding the use of this medicine in children. Special care may be needed. °Overdosage: If you think you have taken too much of this medicine contact a poison control center or emergency room at once. °NOTE: This medicine is only for you. Do not share this medicine with others. °What if I miss a dose? °If you miss a dose, take it as soon as you can. If it is almost time for your next dose, take only that  dose. Do not take double or extra doses. °What may interact with this medicine? °Do not take this medicine with any of the following medications: °-cisapride °-droperidol °-general anesthetics °-grepafloxacin °-perphenazine °-thioridazine °This medicine may also interact with the following medications: °-bromocriptine °-cyclosporine °-diuretics °-medicines for blood pressure, heart disease, irregular heart beat °-medicines for diabetes, including insulin °-quinidine °This list may not describe all possible interactions. Give your health care provider a list of all the medicines, herbs, non-prescription drugs, or dietary supplements you use. Also tell them if you smoke, drink alcohol, or use illegal drugs. Some items may interact with your medicine. °What should I watch for while using this medicine? °Visit your doctor or health care professional for regular checks on your progress. °To help reduce irritation at the injection site, use a different site for each injection and make sure the solution is at room temperature before use. °This medicine may cause increases or decreases in blood sugar. Signs of high blood sugar include frequent urination, unusual thirst, flushed or dry skin, difficulty breathing, drowsiness, stomach ache, nausea, vomiting or dry mouth. Signs of low blood sugar include chills, cool, pale skin or cold sweats, drowsiness, extreme hunger, fast heartbeat, headache, nausea, nervousness or anxiety, shakiness, trembling, unsteadiness, tiredness, or weakness. Contact your doctor or health care professional right away if you experience any of these symptoms. °What side effects may I notice from receiving this medicine? °Side effects that you should report to your doctor or health care professional as soon as possible: °-allergic reactions like skin rash, itching or hives, swelling   of the face, lips, or tongue -changes in blood sugar -changes in heart rate -severe stomach pain Side effects that  usually do not require medical attention (report to your doctor or health care professional if they continue or are bothersome): -diarrhea or constipation -gas or stomach pain -nausea, vomiting -pain, redness, swelling and irritation at site where injected This list may not describe all possible side effects. Call your doctor for medical advice about side effects. You may report side effects to FDA at 1-800-FDA-1088. Where should I keep my medicine? Keep out of the reach of children. Store in a refrigerator between 2 and 8 degrees C (36 and 46 degrees F). Protect from light. Allow to come to room temperature naturally. Do not use artificial heat. If protected from light, the injection may be stored at room temperature between 20 and 30 degrees C (70 and 86 degrees F) for 14 days. After the initial use, throw away any unused portion of a multiple dose vial after 14 days. Throw away unused portions of the ampules after use. NOTE: This sheet is a summary. It may not cover all possible information. If you have questions about this medicine, talk to your doctor, pharmacist, or health care provider.  2018 Elsevier/Gold Standard (2008-05-17 16:56:04)   You are not receiving this medication for acromegaly. You are receiving this medication instruction sheet to have a record of the name of the drug and the possible side effects.

## 2017-02-27 NOTE — Telephone Encounter (Signed)
Instructed to decrease Torsemide to 40mg  daily instead of 60mg . Will check BMET in one week.  Balinda Quails RN, VAD Coordinator 24/7 pager [phone]

## 2017-02-28 ENCOUNTER — Other Ambulatory Visit (HOSPITAL_COMMUNITY): Payer: Self-pay | Admitting: Adult Health

## 2017-03-03 ENCOUNTER — Telehealth (HOSPITAL_COMMUNITY): Payer: Self-pay | Admitting: Pharmacist

## 2017-03-03 NOTE — Telephone Encounter (Signed)
Received a message that Mr. Brosh had excessive diarrhea with doxycycline (cellulitis of right foot) so only took 3 days worth and then discontinued on Saturday. His diarrhea has improved but still having soft stools. He stated that he thought someone told him never to stop his docusate but I have advised him that he can while he is having diarrhea/soft stools. He also stated that his foot is much improved and he is not having any fevers or chills so I have advised him to stay off of the doxycyline. He is scheduled for lab work this Thursday and we will plan to recheck his foot at that time for further abx recommendations.   Ruta Hinds. Velva Harman, PharmD, BCPS, CPP Clinical Pharmacist Pager: [phone] Phone: [phone] 03/03/2017 12:09 PM

## 2017-03-06 ENCOUNTER — Ambulatory Visit (HOSPITAL_COMMUNITY): Payer: Self-pay | Admitting: Pharmacist

## 2017-03-06 ENCOUNTER — Ambulatory Visit (HOSPITAL_COMMUNITY)
Admission: RE | Admit: 2017-03-06 | Discharge: 2017-03-06 | Disposition: A | Discharge status: Home / Self Care | Payer: Medicare Other | Source: Ambulatory Visit | Attending: Cardiology | Admitting: Cardiology

## 2017-03-06 ENCOUNTER — Inpatient Hospital Stay (HOSPITAL_COMMUNITY): Admission: RE | Admit: 2017-03-06 | Payer: Medicare Other | Source: Ambulatory Visit

## 2017-03-06 DIAGNOSIS — Z95811 Presence of heart assist device: Secondary | ICD-10-CM

## 2017-03-06 DIAGNOSIS — I509 Heart failure, unspecified: Secondary | ICD-10-CM | POA: Diagnosis present

## 2017-03-06 LAB — BASIC METABOLIC PANEL
Anion gap: 12 (ref 5–15)
BUN: 52 mg/dL — ABNORMAL HIGH (ref 6–20)
CHLORIDE: 95 mmol/L — AB (ref 101–111)
CO2: 25 mmol/L (ref 22–32)
CREATININE: 2.71 mg/dL — AB (ref 0.61–1.24)
Calcium: 8.7 mg/dL — ABNORMAL LOW (ref 8.9–10.3)
GFR calc non Af Amer: 22 mL/min — ABNORMAL LOW (ref >60)
GFR, EST AFRICAN AMERICAN: 26 mL/min — AB (ref >60)
Glucose, Bld: 222 mg/dL — ABNORMAL HIGH (ref 65–99)
POTASSIUM: 3.8 mmol/L (ref 3.5–5.1)
SODIUM: 132 mmol/L — AB (ref 135–145)

## 2017-03-06 LAB — PROTIME-INR
INR: 4.76
Prothrombin Time: 46 seconds — ABNORMAL HIGH (ref 11.4–15.2)

## 2017-03-06 MED ORDER — TRAMADOL HCL 50 MG PO TABS: ORAL_TABLET | ORAL | 2 refills | Status: DC

## 2017-03-06 MED ORDER — CEPHALEXIN 500 MG PO CAPS: 500.0000 mg | ORAL_CAPSULE | Freq: Three times a day (TID) | ORAL | 3 refills | Status: DC

## 2017-03-06 NOTE — Progress Notes (Signed)
Patient presents to clinic today for Lab visit. In clinic, personally examined cellulitis of right foot. Improved with 3 days of Doxy but patient unable to tolerate due to diarrhea. Prescription for keflex sent to pharmacy per Dr.McLean. Noted on BMET results that creatinine is elevated to 2.7. Orders received to stop torsemide for 3 days then decrease to 20 mg daily. Patient given instructions.  Balinda Quails RN, VAD Coordinator 24/7 pager [phone]

## 2017-03-10 ENCOUNTER — Other Ambulatory Visit (HOSPITAL_COMMUNITY): Payer: Medicare Other

## 2017-03-12 ENCOUNTER — Other Ambulatory Visit (HOSPITAL_COMMUNITY): Payer: Self-pay | Admitting: Unknown Physician Specialty

## 2017-03-12 DIAGNOSIS — Z7901 Long term (current) use of anticoagulants: Secondary | ICD-10-CM

## 2017-03-12 DIAGNOSIS — Z95811 Presence of heart assist device: Secondary | ICD-10-CM

## 2017-03-13 ENCOUNTER — Ambulatory Visit (HOSPITAL_COMMUNITY): Payer: Self-pay | Admitting: Pharmacist

## 2017-03-13 ENCOUNTER — Encounter (HOSPITAL_COMMUNITY): Payer: Self-pay | Admitting: Unknown Physician Specialty

## 2017-03-13 ENCOUNTER — Other Ambulatory Visit (HOSPITAL_COMMUNITY): Payer: Self-pay | Admitting: Unknown Physician Specialty

## 2017-03-13 ENCOUNTER — Ambulatory Visit (HOSPITAL_COMMUNITY)
Admission: RE | Admit: 2017-03-13 | Discharge: 2017-03-13 | Disposition: A | Discharge status: Home / Self Care | Payer: Medicare Other | Source: Ambulatory Visit | Attending: Internal Medicine | Admitting: Internal Medicine

## 2017-03-13 ENCOUNTER — Other Ambulatory Visit (HOSPITAL_COMMUNITY): Payer: Self-pay | Admitting: Pharmacist

## 2017-03-13 DIAGNOSIS — Z7901 Long term (current) use of anticoagulants: Secondary | ICD-10-CM

## 2017-03-13 DIAGNOSIS — Z95811 Presence of heart assist device: Secondary | ICD-10-CM

## 2017-03-13 LAB — BASIC METABOLIC PANEL
Anion gap: 10 (ref 5–15)
BUN: 60 mg/dL — AB (ref 6–20)
CALCIUM: 8.7 mg/dL — AB (ref 8.9–10.3)
CO2: 23 mmol/L (ref 22–32)
CREATININE: 3.67 mg/dL — AB (ref 0.61–1.24)
Chloride: 99 mmol/L — ABNORMAL LOW (ref 101–111)
GFR, EST AFRICAN AMERICAN: 18 mL/min — AB (ref >60)
GFR, EST NON AFRICAN AMERICAN: 15 mL/min — AB (ref >60)
Glucose, Bld: 272 mg/dL — ABNORMAL HIGH (ref 65–99)
Potassium: 4.5 mmol/L (ref 3.5–5.1)
SODIUM: 132 mmol/L — AB (ref 135–145)

## 2017-03-13 LAB — PROTIME-INR
INR: 2.57
PROTHROMBIN TIME: 28.1 s — AB (ref 11.4–15.2)

## 2017-03-13 LAB — LACTATE DEHYDROGENASE: LDH: 269 U/L — ABNORMAL HIGH (ref 98–192)

## 2017-03-13 MED ORDER — WARFARIN SODIUM 5 MG PO TABS: 7.5000 mg | ORAL_TABLET | Freq: Every day | ORAL | 6 refills | Status: DC

## 2017-03-13 NOTE — Progress Notes (Signed)
Pts Creatinine is up to 3.67 today. Pt was called at home and informed me that his weight is up 10 pounds, he is tired and his legs are swollen. Pt was instructed on 5/3 to stop Torsemide for 3 days and then decrease to 20 mg daily. Pt was compliant. Pt was instructed to take 40 mg of Torsemide today per [name]. Pt will be seen in clinic tomorrow at 10am.

## 2017-03-14 ENCOUNTER — Ambulatory Visit (HOSPITAL_COMMUNITY)
Admission: RE | Admit: 2017-03-14 | Discharge: 2017-03-14 | Disposition: A | Discharge status: Home / Self Care | Payer: Medicare Other | Source: Ambulatory Visit | Attending: Cardiology | Admitting: Cardiology

## 2017-03-14 ENCOUNTER — Encounter (HOSPITAL_COMMUNITY): Payer: Self-pay

## 2017-03-14 ENCOUNTER — Other Ambulatory Visit (HOSPITAL_COMMUNITY): Payer: Self-pay | Admitting: Adult Health

## 2017-03-14 VITALS — BP 107/66 | HR 82 | Resp 16 | Ht 65.0 in | Wt 189.6 lb

## 2017-03-14 DIAGNOSIS — Z794 Long term (current) use of insulin: Secondary | ICD-10-CM | POA: Insufficient documentation

## 2017-03-14 DIAGNOSIS — I5022 Chronic systolic (congestive) heart failure: Secondary | ICD-10-CM

## 2017-03-14 DIAGNOSIS — J45909 Unspecified asthma, uncomplicated: Secondary | ICD-10-CM | POA: Insufficient documentation

## 2017-03-14 DIAGNOSIS — I480 Paroxysmal atrial fibrillation: Secondary | ICD-10-CM | POA: Insufficient documentation

## 2017-03-14 DIAGNOSIS — N183 Chronic kidney disease, stage 3 (moderate): Secondary | ICD-10-CM | POA: Diagnosis not present

## 2017-03-14 DIAGNOSIS — I50810 Right heart failure, unspecified: Secondary | ICD-10-CM

## 2017-03-14 DIAGNOSIS — Z95811 Presence of heart assist device: Secondary | ICD-10-CM | POA: Insufficient documentation

## 2017-03-14 DIAGNOSIS — L309 Dermatitis, unspecified: Secondary | ICD-10-CM

## 2017-03-14 DIAGNOSIS — Z8249 Family history of ischemic heart disease and other diseases of the circulatory system: Secondary | ICD-10-CM | POA: Diagnosis not present

## 2017-03-14 DIAGNOSIS — E1122 Type 2 diabetes mellitus with diabetic chronic kidney disease: Secondary | ICD-10-CM | POA: Insufficient documentation

## 2017-03-14 DIAGNOSIS — Z7902 Long term (current) use of antithrombotics/antiplatelets: Secondary | ICD-10-CM | POA: Diagnosis not present

## 2017-03-14 DIAGNOSIS — Z951 Presence of aortocoronary bypass graft: Secondary | ICD-10-CM | POA: Diagnosis not present

## 2017-03-14 DIAGNOSIS — I509 Heart failure, unspecified: Secondary | ICD-10-CM

## 2017-03-14 DIAGNOSIS — I130 Hypertensive heart and chronic kidney disease with heart failure and stage 1 through stage 4 chronic kidney disease, or unspecified chronic kidney disease: Secondary | ICD-10-CM | POA: Insufficient documentation

## 2017-03-14 DIAGNOSIS — D631 Anemia in chronic kidney disease: Secondary | ICD-10-CM | POA: Diagnosis not present

## 2017-03-14 DIAGNOSIS — N179 Acute kidney failure, unspecified: Secondary | ICD-10-CM | POA: Diagnosis not present

## 2017-03-14 DIAGNOSIS — I255 Ischemic cardiomyopathy: Secondary | ICD-10-CM | POA: Insufficient documentation

## 2017-03-14 DIAGNOSIS — I482 Chronic atrial fibrillation: Secondary | ICD-10-CM | POA: Diagnosis not present

## 2017-03-14 DIAGNOSIS — G4733 Obstructive sleep apnea (adult) (pediatric): Secondary | ICD-10-CM | POA: Diagnosis not present

## 2017-03-14 DIAGNOSIS — Z833 Family history of diabetes mellitus: Secondary | ICD-10-CM | POA: Diagnosis not present

## 2017-03-14 DIAGNOSIS — I2510 Atherosclerotic heart disease of native coronary artery without angina pectoris: Secondary | ICD-10-CM | POA: Diagnosis not present

## 2017-03-14 DIAGNOSIS — L259 Unspecified contact dermatitis, unspecified cause: Secondary | ICD-10-CM | POA: Diagnosis not present

## 2017-03-14 MED ORDER — TORSEMIDE 20 MG PO TABS: 20.0000 mg | ORAL_TABLET | Freq: Three times a day (TID) | ORAL | 5 refills | Status: DC

## 2017-03-14 NOTE — Patient Instructions (Addendum)
Stop Spironolactone.  Resume taking Torsemide 60 mg (3 tablets) daily.  Return to clinic for labs Monday. Return for VAD visit Wednesday.  Dermatology appointment made for 05/08/17 at 1100 with Dr Renda Rolls. ([place] across from [place])  Dr Hollie Salk appointment made for May 23rd at 3pm.  Only use Saline Wipes for driveline care. Do not use CHG (popcicle cleaner) around site.

## 2017-03-14 NOTE — Progress Notes (Addendum)
Patient presents for sick visit in [facility] today. Reports no problems with VAD equipment or concerns with drive line. States he is more shob but can still lay flat and do ADL's. Can not walk long distances or do yard work this week.  Vital Signs:  Doppler Pressure 120   Automatc BP: 107/66 (92) HR:82   SPO2:95  %  Weight: 189.6 lb w/o eqt (up 15 pounds) Last weight: 174.6 lb Home weights: 185 lbs   VAD Indication: DT- age excluding    VAD interrogation & Equipment Management: Speed:9000 Flow: 6.0 Power:5.6 w    PI:6.1  Alarms: no clinical alarms Events: none  Fixed speed 9000 Low speed limit: 8200  Primary Controller:  Replace back up battery in 43months. Back up controller:   Replace back up battery in 19 months.  Annual Equipment Maintenance on UBC/PM was performed on 12/2016.   I reviewed the LVAD parameters from today and compared the results to the patient's prior recorded data. LVAD interrogation was NEGATIVE for significant power changes, NEGATIVE for clinical alarms and STABLE for PI events/speed drops. No programming changes were made and pump is functioning within specified parameters. Pt is performing daily controller and system monitor self tests along with completing weekly and monthly maintenance for LVAD equipment.  LVAD equipment check completed and is in good working order. Back-up equipment present. Charged back up battery and performed self-test on equipment.   Exit Site Care: Drive line is being maintained every other day  by Trevor [name]. Drive line exit site red and rash noted. The velour is fully implanted at exit site. Dressing dry and intact. No erythema or drainage. Stabilization device present and accurately applied. Pt denies fever or chills. Pt states they have adequate dressing supplies at home. Trevor Grinder NP assessed site and recommended dermatology follow up for possible dermatitis. Patient also psoriasis.  Significant Events on VAD Support:   02/2017> GIB- AVM clipped x2  Device:Medtronic Therapies: on Last check: 02/2017  Plan- 1. Stop Arlyce Harman 2. Increase torsemide back to 60 mg daily 3. RTC Monday for cbc, bmet, ldh and Wednesday for VAD visit after Octreotide inj 4. Only use saline for driveline dressing care 5. Referrals/APPTS made for Dr. Hollie [name] (renal) and Dr. 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   Relocated to [place] from [place] in early 2018. Over the last couple of years, he had been admitted multiple times for CHF. Last admission in [place] was in 1/18. Sounds like the hospitalization was complicated by cardiorenal syndrome and there was consideration of doing dialysis.  He was admitted in to [facility] 11/27/16 with NYHA class IV symptoms and hypotension. He was placed on dual inotropes to facilitate diuresis, cardiac output and renal function.  CT surgery consulted for mechanical support and he was deemed appropriate for LVAD work up. He completed LVAD work up and was approved for HMII LVAD --> DT. On 12/11/16, he had IABP placed to optimize cardiac output and improve renal function. He then underwent HMII LVAD placement + tricuspid valve repair on 12/14/2015. Post operatively pressors weaned off slowly. He required significant diuresis.  Course was complicated by RV dysfunction and renal dysfunction.  Patient was admitted with  lower GI bleeding in 4/18.  He had been on ASA 325 (increased with mild LDH elevation) and warfarin INR 2-2.5.  He had 4 units PRBCs total.  Colonoscopy showed colonic AVM treated with APC and clipping.  He was sent home off ASA and on warfarin INR goal 2-2.5.   Recently, creatinine  was noted to increase.  This week, it was up to 3.67.  I cut back on his torsemide but now his weight is up 15 lbs. He has increased dyspnea with more strenuous activity, but he is still able to walk on flat ground without dyspnea. No orthopnea/PND.  He does not use NSAIDs.  Only new medications have been doxycycline and then Keflex for possible cellulitis on his right foot. MAP 92 today, LVAD parameters stable.  He has developed a rash around his driveline site that looks like a contact dermatitis.   Labs (3/18): LDH 331 => 401 => 443 => 366 => 313, K 3.6 => 3.2 => 3.5, creatinine 1.98 => 1.74 => 1.8 => 1.79, hgb 8.3 => 8.9 => 8.7 => 8.4, INR 2.11 => 2.4 Labs (4/18): hgb 9.4 Labs (5/18): LDH 269, INR 2.57, K 4.5, creatinine 1.92 => 2.71 => 3.67  PMH: 1. CAD: s/p CABG in 2010.  2. Aortic stenosis: s/p TAVR in 2015. Valve looked ok on 2/18 echo.  3. Atrial fibrillation: Chronic.  4. Type II diabetes.  5. CKD: Stage III, cardiorenal syndrome.  6. Chronic systolic CHF: Ischemic cardiomyopathy with prominent RV dysfunction.  Medtronic ICD.  - Echo (2/18): EF 20-25%, moderately dilated LV, moderately dilated/moderately dysfunctional RV, severe TR.  - Cardiogenic shock 2/18 requiring milrinone, norepinephrine, and IABP.  - Heartmate II LVAD for DT placed 12/13/16.   7. OSA.  8. Thrombocytopenia: Post-op LVAD, resolved.  9. Tricuspid regurgitation: Severe, s/p repair with LVAD placement in 2/18.  10. Lower GI bleed (4/18): Colonic AVM, treated with APC and clipping.   Social History   Social History  . Marital status: Widowed    Spouse name: N/A  . Number of children: 0  . Years of education: 14   Occupational History  . Not on file.   Social History Main Topics  . Smoking status: Never Smoker  . Smokeless tobacco: Never Used  . Alcohol use No  . Drug use: No  . Sexual activity: Not Currently   Other Topics Concern  . Not on file   Social History Narrative   Patient is a widow.  Moved to [place] from [place], [place]. Currently lives with his sister Trevor [name].    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    Family History  Problem Relation Age of Onset  . Heart failure Father   . Heart attack Father   . Healthy Mother   . Diabetes Paternal Grandfather    ROS: All systems reviewed and negative except as per HPI.   Current Outpatient Prescriptions  Medication Sig Dispense Refill  . albuterol (PROVENTIL HFA;VENTOLIN HFA) 108 (90 Base) MCG/ACT inhaler Inhale 2 puffs into the lungs every 4 (four) hours as needed for wheezing or shortness of breath. 1 Inhaler 5  . albuterol (PROVENTIL) (2.5 MG/3ML) 0.083% nebulizer solution Take 3 mLs (2.5 mg total) by nebulization every 4 (four) hours. And as needed 150 mL 5  . amLODipine (NORVASC) 5 MG tablet Take 1 tablet (5 mg total) by mouth daily. 30 tablet 6  . ascorbic acid (VITAMIN C) 1000 MG tablet Take 1,000 mg by mouth daily.    Marland Kitchen  atorvastatin (LIPITOR) 40 MG tablet Take 1 tablet (40 mg total) by mouth daily. 30 tablet 6  . busPIRone (BUSPAR) 5 MG tablet Take 1 tablet (5 mg total) by mouth 2 (two) times daily. 60 tablet 6  . cephALEXin (KEFLEX) 500 MG capsule Take 1 capsule (500 mg total) by mouth 3 (three) times daily. 21 capsule 3  . fluticasone (FLOVENT HFA) 110 MCG/ACT inhaler Inhale 2 puffs into the lungs 2 (two) times daily. 1 Inhaler 5  . gabapentin (NEURONTIN) 600 MG tablet Take 0.5 tablets (300 mg total) by mouth 2 (two) times daily. 60 tablet 6  . insulin glargine (LANTUS) 100 unit/mL SOPN Inject 0.2 mLs (20 Units total) into the skin at bedtime. 15 mL 0  . levothyroxine (SYNTHROID, LEVOTHROID) 25 MCG tablet Take 1 tablet (25 mcg total) by mouth daily before breakfast. 30 tablet 6  . Multiple Vitamin (MULTIVITAMIN) tablet Take 1 tablet by mouth daily.    . pantoprazole (PROTONIX) 40 MG tablet Take 1 tablet (40 mg total) by mouth daily at 12 noon. 30 tablet 6  . sildenafil (REVATIO) 20 MG tablet Take  1 tablet (20 mg total) by mouth 3 (three) times daily. 90 tablet 6  . torsemide (DEMADEX) 20 MG tablet Take 1 tablet (20 mg total) by mouth 3 (three) times daily. 90 tablet 5  . traMADol (ULTRAM) 50 MG tablet TAKE 1 TABLET BY MOUTH EVERY 6 HOURS AS NEEDED FOR MODERATE PAIN 30 tablet 2  . traZODone (DESYREL) 50 MG tablet TAKE 1 TABLET(50 MG) BY MOUTH AT BEDTIME AS NEEDED FOR SLEEP 30 tablet 3  . warfarin (COUMADIN) 5 MG tablet Take 1.5 tablets (7.5 mg total) by mouth daily. 45 tablet 6  . benzonatate (TESSALON) 100 MG capsule Take 1 capsule (100 mg total) by mouth 3 (three) times daily as needed for cough. (Patient not taking: Reported on 03/14/2017) 20 capsule 6  . DOK 100 MG capsule TAKE 2 CAPSULES(200 MG) BY MOUTH DAILY 30 capsule 0   No current facility-administered medications for this encounter.    BP 107/66   Pulse 82   Resp 16   Ht 5\' 5"  (1.651 m)   Wt 189 lb 9.6 oz (86 kg)   SpO2 95%   BMI 31.55 kg/m   MAP 92 General: NAD Neck: JVP 14 cm, no thyromegaly or thyroid nodule.  Lungs: CTAB  CV: LVAD hum.  1+ edema to knees bilaterally.  No carotid bruit.   Abdomen: Soft, nontender, no hepatosplenomegaly, moderate distention.  Skin: Intact without lesions or rashes.  Neurologic: Alert and oriented x 3.  Psych: Normal affect. Extremities: No clubbing or cyanosis.  HEENT: Normal.   Assessment/Plan: 1. Chronic systolic CHF: Ischemic cardiomyopathy, EF 20-25% with RV dysfunction on 2/18 echo pre-LVAD.  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  Medtronic ICD.  He has felt much better post-LVAD, NYHA class II.  However, recently has developed AKI, now with volume overload after cutting back on torsemide.  LVAD parameters reviewed today and stable.    - Restart torsemide 60 mg daily given volume overload, will have to follow renal function closely.    - Continue warfarin, no ASA with recent GI bleeding.  - He will be starting cardiac rehab.     2. CAD: s/p CABG.  No chest pain.  He is on  atorvastatin.  3. AKI on CKD stage III:  Creatinine up significantly to 3.67.  We cut back on his torsemide, but he is now very volume overloaded.  As  above, will restart torsemide 60 mg daily.  With AKI/elevated creatinine, will stop spironolactone for now.  I will have him stop Keflex in case his AKI could be a reaction to this med. He will need to have followup with his nephrologist arranged.  4. Atrial fibrillation: Chronic.  On warfarin.  5. 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  6. HTN: Controlled.  Continue amlodipine but stop spironolactone with creatinine > 3.   7. Anemia: Stable, baseline anemia of renal disease/chronic disease but recent bleeding from colonic AVMs.  8. 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  - Continue octreotide injections.  9. ?Cellulitis right foot: Foot looks much better. Stop Keflex as above.  10. Rash around driveline site: ? Contact dermatitis.  We will arrange dermatology evaluation.   Loralie Champagne 03/15/2017

## 2017-03-17 ENCOUNTER — Ambulatory Visit (HOSPITAL_COMMUNITY): Payer: Self-pay | Admitting: Pharmacist

## 2017-03-17 ENCOUNTER — Telehealth (HOSPITAL_COMMUNITY): Payer: Self-pay | Admitting: *Deleted

## 2017-03-17 ENCOUNTER — Ambulatory Visit (HOSPITAL_COMMUNITY)
Admission: RE | Admit: 2017-03-17 | Discharge: 2017-03-17 | Disposition: A | Discharge status: Home / Self Care | Payer: Medicare Other | Source: Ambulatory Visit | Attending: Cardiology | Admitting: Cardiology

## 2017-03-17 DIAGNOSIS — Z95811 Presence of heart assist device: Secondary | ICD-10-CM | POA: Diagnosis not present

## 2017-03-17 DIAGNOSIS — I255 Ischemic cardiomyopathy: Secondary | ICD-10-CM | POA: Insufficient documentation

## 2017-03-17 LAB — BASIC METABOLIC PANEL
Anion gap: 13 (ref 5–15)
BUN: 56 mg/dL — AB (ref 6–20)
CHLORIDE: 95 mmol/L — AB (ref 101–111)
CO2: 24 mmol/L (ref 22–32)
Calcium: 8.6 mg/dL — ABNORMAL LOW (ref 8.9–10.3)
Creatinine, Ser: 3.43 mg/dL — ABNORMAL HIGH (ref 0.61–1.24)
GFR calc Af Amer: 19 mL/min — ABNORMAL LOW (ref >60)
GFR calc non Af Amer: 17 mL/min — ABNORMAL LOW (ref >60)
Glucose, Bld: 241 mg/dL — ABNORMAL HIGH (ref 65–99)
Potassium: 3.7 mmol/L (ref 3.5–5.1)
Sodium: 132 mmol/L — ABNORMAL LOW (ref 135–145)

## 2017-03-17 LAB — CBC
HCT: 25.3 % — ABNORMAL LOW (ref 39.0–52.0)
Hemoglobin: 8.5 g/dL — ABNORMAL LOW (ref 13.0–17.0)
MCH: 31.3 pg (ref 26.0–34.0)
MCHC: 33.6 g/dL (ref 30.0–36.0)
MCV: 93 fL (ref 78.0–100.0)
Platelets: 98 K/uL — ABNORMAL LOW (ref 150–400)
RBC: 2.72 MIL/uL — ABNORMAL LOW (ref 4.22–5.81)
RDW: 17.7 % — ABNORMAL HIGH (ref 11.5–15.5)
WBC: 8.1 K/uL (ref 4.0–10.5)

## 2017-03-17 LAB — PROTIME-INR
INR: 2.49
PROTHROMBIN TIME: 27.4 s — AB (ref 11.4–15.2)

## 2017-03-17 LAB — TSH: TSH: 1.367 u[IU]/mL (ref 0.350–4.500)

## 2017-03-17 LAB — LACTATE DEHYDROGENASE: LDH: 273 U/L — ABNORMAL HIGH (ref 98–192)

## 2017-03-17 NOTE — Telephone Encounter (Signed)
Called patient regarding lab results. Instructed to continue taking his torsemide 60 mg every day and we will recheck labs at his VAD visit Thursday.   Balinda Quails RN, VAD Coordinator 24/7 pager [phone]

## 2017-03-18 ENCOUNTER — Encounter: Payer: Self-pay | Admitting: Family

## 2017-03-18 ENCOUNTER — Other Ambulatory Visit: Payer: Self-pay | Admitting: Pharmacist

## 2017-03-18 ENCOUNTER — Other Ambulatory Visit (HOSPITAL_COMMUNITY): Payer: Self-pay | Admitting: *Deleted

## 2017-03-18 ENCOUNTER — Ambulatory Visit (INDEPENDENT_AMBULATORY_CARE_PROVIDER_SITE_OTHER): Payer: Medicare Other | Admitting: Family

## 2017-03-18 ENCOUNTER — Other Ambulatory Visit: Payer: Self-pay | Admitting: Infectious Diseases

## 2017-03-18 VITALS — HR 49 | Temp 98.6°F | Resp 16 | Ht 65.0 in | Wt 189.1 lb

## 2017-03-18 DIAGNOSIS — R946 Abnormal results of thyroid function studies: Secondary | ICD-10-CM

## 2017-03-18 DIAGNOSIS — R7989 Other specified abnormal findings of blood chemistry: Secondary | ICD-10-CM

## 2017-03-18 DIAGNOSIS — Z794 Long term (current) use of insulin: Secondary | ICD-10-CM | POA: Diagnosis not present

## 2017-03-18 DIAGNOSIS — E119 Type 2 diabetes mellitus without complications: Secondary | ICD-10-CM | POA: Diagnosis not present

## 2017-03-18 DIAGNOSIS — I255 Ischemic cardiomyopathy: Secondary | ICD-10-CM | POA: Diagnosis not present

## 2017-03-18 LAB — HEMOGLOBIN A1C
Hgb A1c MFr Bld: 6.7 % — ABNORMAL HIGH (ref 4.8–5.6)
Mean Plasma Glucose: 146 mg/dL

## 2017-03-18 NOTE — Progress Notes (Signed)
Subjective:    Patient ID: Trevor [name], male    DOB: 1946-09-12, 71 y.o.   MRN: [id number]  Chief Complaint  Patient presents with  . Follow-up    HPI:  Trevor [name] is a 71 y.o. male who  has a past medical history of AICD (automatic cardioverter/defibrillator) present; Asthma; CHF (congestive heart failure) ([place]); Diabetes ([place]); Kidney disease; Presence of permanent cardiac pacemaker; and Sleep apnea. and presents today for a follow up office visit.   1.) Type 2 diabetes - Reports taking the medication as prescribed and denies adverse side effects or hypoglycemic readings. Blood sugars at home have been ranging between 120-140. Denies new symptoms of end organ damage. No excessive hunger, thirst, or urination. Working on a low/carbohydrate modified oral intake.   Lab Results  Component Value Date   HGBA1C 6.7 (H) 03/17/2017     Lab Results  Component Value Date   CREATININE 3.43 (H) 03/17/2017   BUN 56 (H) 03/17/2017   NA 132 (L) 03/17/2017   K 3.7 03/17/2017   CL 95 (L) 03/17/2017   CO2 24 03/17/2017    2.) Hypothyroidism - Currently maintained on levothyroxine. Reports taking the medication as prescribed and denies adverse side effects.    Lab Results  Component Value Date   TSH 1.367 03/17/2017    No Known Allergies    Outpatient Medications Prior to Visit  Medication Sig Dispense Refill  . albuterol (PROVENTIL HFA;VENTOLIN HFA) 108 (90 Base) MCG/ACT inhaler Inhale 2 puffs into the lungs every 4 (four) hours as needed for wheezing or shortness of breath. 1 Inhaler 5  . albuterol (PROVENTIL) (2.5 MG/3ML) 0.083% nebulizer solution Take 3 mLs (2.5 mg total) by nebulization every 4 (four) hours. And as needed 150 mL 5  . amLODipine (NORVASC) 5 MG tablet Take 1 tablet (5 mg total) by mouth daily. 30 tablet 6  . ascorbic acid (VITAMIN C) 1000 MG tablet Take 1,000 mg by mouth daily.    Marland Kitchen atorvastatin (LIPITOR) 40 MG tablet Take 1 tablet (40 mg total)  by mouth daily. 30 tablet 6  . benzonatate (TESSALON) 100 MG capsule Take 1 capsule (100 mg total) by mouth 3 (three) times daily as needed for cough. 20 capsule 6  . busPIRone (BUSPAR) 5 MG tablet Take 1 tablet (5 mg total) by mouth 2 (two) times daily. 60 tablet 6  . DOK 100 MG capsule TAKE 2 CAPSULES(200 MG) BY MOUTH DAILY 30 capsule 0  . fluticasone (FLOVENT HFA) 110 MCG/ACT inhaler Inhale 2 puffs into the lungs 2 (two) times daily. 1 Inhaler 5  . gabapentin (NEURONTIN) 600 MG tablet Take 0.5 tablets (300 mg total) by mouth 2 (two) times daily. 60 tablet 6  . insulin glargine (LANTUS) 100 unit/mL SOPN Inject 0.2 mLs (20 Units total) into the skin at bedtime. 15 mL 0  . levothyroxine (SYNTHROID, LEVOTHROID) 25 MCG tablet Take 1 tablet (25 mcg total) by mouth daily before breakfast. 30 tablet 6  . Multiple Vitamin (MULTIVITAMIN) tablet Take 1 tablet by mouth daily.    . pantoprazole (PROTONIX) 40 MG tablet Take 1 tablet (40 mg total) by mouth daily at 12 noon. 30 tablet 6  . sildenafil (REVATIO) 20 MG tablet Take 1 tablet (20 mg total) by mouth 3 (three) times daily. 90 tablet 6  . torsemide (DEMADEX) 20 MG tablet Take 1 tablet (20 mg total) by mouth 3 (three) times daily. 90 tablet 5  . traMADol (ULTRAM) 50 MG tablet TAKE  1 TABLET BY MOUTH EVERY 6 HOURS AS NEEDED FOR MODERATE PAIN 30 tablet 2  . traZODone (DESYREL) 50 MG tablet TAKE 1 TABLET(50 MG) BY MOUTH AT BEDTIME AS NEEDED FOR SLEEP 30 tablet 3  . warfarin (COUMADIN) 5 MG tablet Take 1.5 tablets (7.5 mg total) by mouth daily. 45 tablet 6  . cephALEXin (KEFLEX) 500 MG capsule Take 1 capsule (500 mg total) by mouth 3 (three) times daily. 21 capsule 3   No facility-administered medications prior to visit.     Review of Systems  Constitutional: Negative for chills and fever.  Eyes:       Negative for changes in vision.  Respiratory: Negative for chest tightness and shortness of breath.   Endocrine: Negative for cold intolerance, heat  intolerance, polydipsia, polyphagia and polyuria.  Neurological: Negative for dizziness, weakness, light-headedness and headaches.      Objective:    Pulse (!) 49   Temp 98.6 F (37 C) (Oral)   Resp 16   Ht 5\' 5"  (1.651 m)   Wt 189 lb 1.9 oz (85.8 kg)   SpO2 96%   BMI 31.47 kg/m  Nursing note and vital signs reviewed.  Physical Exam  Constitutional: He is oriented to person, place, and time. He appears well-developed and well-nourished. No distress.  Cardiovascular: Normal heart sounds.   LVAD Humm present.   Pulmonary/Chest: Effort normal and breath sounds normal.  Neurological: He is alert and oriented to person, place, and time.  Skin: Skin is warm and dry.  Psychiatric: He has a normal mood and affect. His behavior is normal. Judgment and thought content normal.       Assessment & Plan:   Problem List Items Addressed This Visit      Endocrine   Type 2 diabetes mellitus ([place]) - Primary    Type 2 diabetes appears well controlled with most recent A1c of 6.7. Encouraged to complete diabetic eye exam at his convenience. Maintained on atorvastatin for CAD reduction. ACE/ARB contraindicated. Diabetic foot exam up to date. Declines Pneuomovax. Continue current dosage of Lantus and monitor blood sugars at home daily. Follow up in 6 months or sooner if needed.         Other   Elevated TSH    Stable with current dosage of levothyroxine and no adverse side effects. Continue current dosage of levothyroxine.           I have discontinued Mr. Lwin's cephALEXin. I am also having him maintain his ascorbic acid, multivitamin, albuterol, fluticasone, albuterol, atorvastatin, gabapentin, levothyroxine, pantoprazole, sildenafil, busPIRone, insulin glargine, traZODone, benzonatate, amLODipine, traMADol, warfarin, torsemide, and DOK.   Follow-up: Return in about 6 months (around 09/18/2017), or if symptoms worsen or fail to improve.  Mauricio Po, FNP

## 2017-03-18 NOTE — Patient Instructions (Addendum)
Thank you for choosing [facility].  SUMMARY AND INSTRUCTIONS:  Please continue to take your medications as prescribed.  Your diabetes and thyroid are well controlled.   Continue to monitor your blood sugars at home.  Follow up in 6 months or sooner if needed.   Follow up:  If your symptoms worsen or fail to improve, please contact our office for further instruction, or in case of emergency go directly to the emergency room at the closest medical facility.

## 2017-03-18 NOTE — Assessment & Plan Note (Signed)
Type 2 diabetes appears well controlled with most recent A1c of 6.7. Encouraged to complete diabetic eye exam at his convenience. Maintained on atorvastatin for CAD reduction. ACE/ARB contraindicated. Diabetic foot exam up to date. Declines Pneuomovax. Continue current dosage of Lantus and monitor blood sugars at home daily. Follow up in 6 months or sooner if needed.

## 2017-03-18 NOTE — Assessment & Plan Note (Signed)
Stable with current dosage of levothyroxine and no adverse side effects. Continue current dosage of levothyroxine.

## 2017-03-19 ENCOUNTER — Encounter (HOSPITAL_COMMUNITY): Payer: Medicare Other

## 2017-03-19 ENCOUNTER — Inpatient Hospital Stay (HOSPITAL_COMMUNITY): Admission: RE | Admit: 2017-03-19 | Payer: Medicare Other | Source: Ambulatory Visit

## 2017-03-20 ENCOUNTER — Ambulatory Visit (HOSPITAL_COMMUNITY): Payer: Self-pay | Admitting: Pharmacist

## 2017-03-20 ENCOUNTER — Ambulatory Visit (HOSPITAL_COMMUNITY)
Admission: RE | Admit: 2017-03-20 | Discharge: 2017-03-20 | Disposition: A | Discharge status: Home / Self Care | Payer: Medicare Other | Source: Ambulatory Visit | Attending: Internal Medicine | Admitting: Internal Medicine

## 2017-03-20 ENCOUNTER — Encounter (HOSPITAL_COMMUNITY): Payer: Self-pay

## 2017-03-20 VITALS — BP 102/65 | HR 58 | Resp 16 | Ht 65.0 in | Wt 191.4 lb

## 2017-03-20 DIAGNOSIS — N183 Chronic kidney disease, stage 3 (moderate): Secondary | ICD-10-CM | POA: Insufficient documentation

## 2017-03-20 DIAGNOSIS — G4733 Obstructive sleep apnea (adult) (pediatric): Secondary | ICD-10-CM | POA: Insufficient documentation

## 2017-03-20 DIAGNOSIS — J45909 Unspecified asthma, uncomplicated: Secondary | ICD-10-CM | POA: Diagnosis not present

## 2017-03-20 DIAGNOSIS — K922 Gastrointestinal hemorrhage, unspecified: Secondary | ICD-10-CM | POA: Diagnosis not present

## 2017-03-20 DIAGNOSIS — Z7901 Long term (current) use of anticoagulants: Secondary | ICD-10-CM | POA: Diagnosis not present

## 2017-03-20 DIAGNOSIS — I509 Heart failure, unspecified: Secondary | ICD-10-CM | POA: Diagnosis not present

## 2017-03-20 DIAGNOSIS — I255 Ischemic cardiomyopathy: Secondary | ICD-10-CM | POA: Diagnosis not present

## 2017-03-20 DIAGNOSIS — Z95811 Presence of heart assist device: Secondary | ICD-10-CM

## 2017-03-20 DIAGNOSIS — E1122 Type 2 diabetes mellitus with diabetic chronic kidney disease: Secondary | ICD-10-CM | POA: Diagnosis not present

## 2017-03-20 DIAGNOSIS — I130 Hypertensive heart and chronic kidney disease with heart failure and stage 1 through stage 4 chronic kidney disease, or unspecified chronic kidney disease: Secondary | ICD-10-CM | POA: Diagnosis not present

## 2017-03-20 DIAGNOSIS — I480 Paroxysmal atrial fibrillation: Secondary | ICD-10-CM | POA: Diagnosis not present

## 2017-03-20 DIAGNOSIS — Z951 Presence of aortocoronary bypass graft: Secondary | ICD-10-CM | POA: Diagnosis not present

## 2017-03-20 DIAGNOSIS — N179 Acute kidney failure, unspecified: Secondary | ICD-10-CM | POA: Diagnosis not present

## 2017-03-20 DIAGNOSIS — D638 Anemia in other chronic diseases classified elsewhere: Secondary | ICD-10-CM | POA: Diagnosis not present

## 2017-03-20 DIAGNOSIS — I2510 Atherosclerotic heart disease of native coronary artery without angina pectoris: Secondary | ICD-10-CM | POA: Insufficient documentation

## 2017-03-20 DIAGNOSIS — Z794 Long term (current) use of insulin: Secondary | ICD-10-CM | POA: Diagnosis not present

## 2017-03-20 DIAGNOSIS — I5023 Acute on chronic systolic (congestive) heart failure: Secondary | ICD-10-CM | POA: Diagnosis not present

## 2017-03-20 DIAGNOSIS — Z79899 Other long term (current) drug therapy: Secondary | ICD-10-CM | POA: Diagnosis not present

## 2017-03-20 DIAGNOSIS — I482 Chronic atrial fibrillation: Secondary | ICD-10-CM | POA: Insufficient documentation

## 2017-03-20 DIAGNOSIS — I50810 Right heart failure, unspecified: Secondary | ICD-10-CM

## 2017-03-20 DIAGNOSIS — I5022 Chronic systolic (congestive) heart failure: Secondary | ICD-10-CM | POA: Insufficient documentation

## 2017-03-20 LAB — BASIC METABOLIC PANEL
Anion gap: 9 (ref 5–15)
BUN: 47 mg/dL — ABNORMAL HIGH (ref 6–20)
CO2: 30 mmol/L (ref 22–32)
CREATININE: 3.1 mg/dL — AB (ref 0.61–1.24)
Calcium: 8.6 mg/dL — ABNORMAL LOW (ref 8.9–10.3)
Chloride: 95 mmol/L — ABNORMAL LOW (ref 101–111)
GFR, EST AFRICAN AMERICAN: 22 mL/min — AB (ref >60)
GFR, EST NON AFRICAN AMERICAN: 19 mL/min — AB (ref >60)
Glucose, Bld: 254 mg/dL — ABNORMAL HIGH (ref 65–99)
Potassium: 3.9 mmol/L (ref 3.5–5.1)
SODIUM: 134 mmol/L — AB (ref 135–145)

## 2017-03-20 LAB — LACTATE DEHYDROGENASE: LDH: 249 U/L — ABNORMAL HIGH (ref 98–192)

## 2017-03-20 LAB — CBC
HCT: 27.5 % — ABNORMAL LOW (ref 39.0–52.0)
Hemoglobin: 9 g/dL — ABNORMAL LOW (ref 13.0–17.0)
MCH: 30.7 pg (ref 26.0–34.0)
MCHC: 32.7 g/dL (ref 30.0–36.0)
MCV: 93.9 fL (ref 78.0–100.0)
PLATELETS: 104 10*3/uL — AB (ref 150–400)
RBC: 2.93 MIL/uL — ABNORMAL LOW (ref 4.22–5.81)
RDW: 17.1 % — AB (ref 11.5–15.5)
WBC: 9.2 10*3/uL (ref 4.0–10.5)

## 2017-03-20 LAB — PROTIME-INR
INR: 3.11
PROTHROMBIN TIME: 32.7 s — AB (ref 11.4–15.2)

## 2017-03-20 MED ORDER — TORSEMIDE 20 MG PO TABS: 80.0000 mg | ORAL_TABLET | Freq: Every day | ORAL | 5 refills | Status: DC

## 2017-03-20 NOTE — Progress Notes (Addendum)
Patient presents for 1 week  follow up in [facility] [name] today. Reports no problems with VAD equipment or concerns with drive line.   Vital Signs:  Doppler Pressure 80   Automatc BP: 102/65 (78) HR:58   SPO2:98  %  Weight: 191.4 lb w/o eqt Last weight: 189.6 lb Home weights: 190 lbs   VAD Indication: DT- age excluding    VAD interrogation & Equipment Management: Speed:9000 Flow: 5.4 Power:5.5 w    PI:6.7  Alarms: no clinical alarms Events: none  Fixed speed 9000 Low speed limit: 8400  Exit Site Care: Drive line is being maintained every other day  by Trevor [name]. Drive line exit site well healed and incorporated. The velour is fully implanted at exit site. Dressing dry and intact. Large scab noted at site. Stabilization device present and accurately applied. Pt denies fever or chills. Pt states they have adequate dressing supplies at home.    Significant Events on VAD Support:  02/2017> GIB- AVM clipped x2  Device:Medtronic Therapies: on Last check: 02/2017  BP & Labs:  MAP80 - Doppler is reflecting MAP  Hgb 9.0 - No S/S of bleeding. Specifically denies melena/BRBPR or nosebleeds.  LDH stable at 249 with established baseline of 225- 300. Denies tea-colored urine. No power elevations noted on interrogation.    Patient unable to complete 6 minute walk test due to fluid gain and shob with exertion.   Neurocognitive trail making completed correctly in 1.5 minutes.   [employer].omyopathy, EQ-5D-3L and post-VAD QOL completed by the patient independently   Plan- 1. Appointment with Dr Hollie Salk Wednesday 2. RTC next week for labs and in two weeks for VAD visit 3. Cardiac rehab begins in 2 weeks. 4. Increase Torsemide to 80 mg daily  Balinda Quails RN VAD Coordinator   Office: [phone] 24/7 Emergency VAD Pager: [phone]     Cardiology: Dr. [name] a 71 y.o.malewith a history of CAD s/p CABG x 4 2010, OSA, AS with TAVR 2015,  CKD, DM2, paroxysmal atrial fibrillation, asthma, and ischemic cardiomyopathy with Medtronic ICD.   Relocated to [place] from [place] in early 2018. Over the last couple of years, he had been admitted multiple times for CHF. Last admission in [place] was in 1/18. Sounds like the hospitalization was complicated by cardiorenal syndrome and there was consideration of doing dialysis.  He was admitted in to [facility] 11/27/16 with NYHA class IV symptoms and hypotension. He was placed on dual inotropes to facilitate diuresis, cardiac output and renal function.  CT surgery consulted for mechanical support and he was deemed appropriate for LVAD work up. He completed LVAD work up and was approved for HMII LVAD --> DT. On 12/11/16, he had IABP placed to optimize cardiac output and improve renal function. He then underwent HMII LVAD placement + tricuspid valve repair on 12/14/2015. Post operatively pressors weaned off slowly. He required significant diuresis.  Course was complicated by RV dysfunction and renal dysfunction.  Patient was admitted with lower GI bleeding in 4/18.  He had been on ASA 325 (increased with mild LDH elevation) and warfarin INR 2-2.5.  He had 4 units PRBCs total.  Colonoscopy showed colonic AVM treated with APC and clipping.  He was sent home off ASA and on warfarin INR goal 2-2.5.   Recently, creatinine was noted to increase as high as 3.67.  I cut back on his torsemide but weight went up considerably.  I increased torsemide back to 60 mg daily.  Weight has not come down, but creatinine has come down to 3.1. He has increased dyspnea with more strenuous activity like walking up stairs, but he is still able to walk on flat ground without dyspnea. No orthopnea/PND.  He does not use NSAIDs.  Only new medications have been doxycycline and then Keflex for possible cellulitis on his right foot, he is now off both. MAP 80 today, LVAD parameters stable.  He will start cardiac rehab soon.    Labs (3/18): LDH 331 => 401 => 443 => 366 => 313, K 3.6 => 3.2 => 3.5, creatinine 1.98 => 1.74 => 1.8 => 1.79, hgb 8.3 => 8.9 => 8.7 => 8.4, INR 2.11 => 2.4 Labs (4/18): hgb 9.4 Labs (5/18): LDH 269 => 249, INR 2.57, K 4.5, creatinine 1.92 => 2.71 => 3.67 => 3.43 => 3.1, hgb 9, plts 104  PMH: 1. CAD: s/p CABG in 2010.  2. Aortic stenosis: s/p TAVR in 2015. Valve looked ok on 2/18 echo.  3. Atrial fibrillation: Chronic.  4. Type II diabetes.  5. CKD: Stage III, cardiorenal syndrome.  6. Chronic systolic CHF: Ischemic cardiomyopathy with prominent RV dysfunction.  Medtronic ICD.  - Echo (2/18): EF 20-25%, moderately dilated LV, moderately dilated/moderately dysfunctional RV, severe TR.  - Cardiogenic shock 2/18 requiring milrinone, norepinephrine, and IABP.  - Heartmate II LVAD for DT placed 12/13/16.   7. OSA.  8. Thrombocytopenia: Post-op LVAD, resolved.  9. Tricuspid regurgitation: Severe, s/p repair with LVAD placement in 2/18.  10. Lower GI bleed (4/18): Colonic AVM, treated with APC and clipping.   Social History   Social History  . Marital status: Widowed    Spouse name: N/A  . Number of children: 0  . Years of education: 14   Occupational History  . Not on file.   Social History Main Topics  . Smoking status: Never Smoker  . Smokeless tobacco: Never Used  . Alcohol use No  . Drug use: No  . Sexual activity: Not Currently   Other Topics Concern  . Not on file   Social History Narrative   Patient is a widow. Moved to [place] from [place], [place]. Currently lives with his sister Trevor [name].    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    Family History  Problem Relation Age of Onset  . Heart failure Father   . Heart attack Father   . Healthy Mother   . Diabetes Paternal Grandfather    ROS: All systems reviewed and negative except as per HPI.   Current Outpatient Prescriptions  Medication Sig Dispense Refill  . albuterol (PROVENTIL  HFA;VENTOLIN HFA) 108 (90 Base) MCG/ACT inhaler Inhale 2 puffs into the lungs every 4 (four) hours as needed for wheezing or shortness of breath. 1 Inhaler 5  . albuterol (PROVENTIL) (2.5 MG/3ML) 0.083% nebulizer solution Take 3 mLs (2.5 mg total) by nebulization every 4 (four) hours. And as needed 150 mL 5  . amLODipine (NORVASC) 5 MG tablet Take 1 tablet (5 mg total) by mouth daily. 30 tablet 6  . ascorbic acid (VITAMIN C) 1000 MG tablet Take 1,000 mg by mouth daily.    Marland Kitchen atorvastatin (LIPITOR) 40 MG tablet Take 1 tablet (40 mg total) by mouth daily. 30 tablet 6  . benzonatate (TESSALON) 100 MG capsule Take 1 capsule (100 mg total) by mouth 3 (three) times daily as needed for cough. 20 capsule 6  . busPIRone (BUSPAR) 5 MG tablet Take 1 tablet (5 mg total)  by mouth 2 (two) times daily. 60 tablet 6  . DOK 100 MG capsule TAKE 2 CAPSULES(200 MG) BY MOUTH DAILY 30 capsule 0  . fluticasone (FLOVENT HFA) 110 MCG/ACT inhaler Inhale 2 puffs into the lungs 2 (two) times daily. 1 Inhaler 5  . gabapentin (NEURONTIN) 600 MG tablet Take 0.5 tablets (300 mg total) by mouth 2 (two) times daily. 60 tablet 6  . insulin glargine (LANTUS) 100 unit/mL SOPN Inject 0.2 mLs (20 Units total) into the skin at bedtime. 15 mL 0  . levothyroxine (SYNTHROID, LEVOTHROID) 25 MCG tablet Take 1 tablet (25 mcg total) by mouth daily before breakfast. 30 tablet 6  . Multiple Vitamin (MULTIVITAMIN) tablet Take 1 tablet by mouth daily.    . pantoprazole (PROTONIX) 40 MG tablet Take 1 tablet (40 mg total) by mouth daily at 12 noon. 30 tablet 6  . sildenafil (REVATIO) 20 MG tablet Take 1 tablet (20 mg total) by mouth 3 (three) times daily. 90 tablet 6  . torsemide (DEMADEX) 20 MG tablet Take 4 tablets (80 mg total) by mouth daily. 90 tablet 5  . traMADol (ULTRAM) 50 MG tablet TAKE 1 TABLET BY MOUTH EVERY 6 HOURS AS NEEDED FOR MODERATE PAIN 30 tablet 2  . traZODone (DESYREL) 50 MG tablet TAKE 1 TABLET(50 MG) BY MOUTH AT BEDTIME AS NEEDED  FOR SLEEP 30 tablet 3  . warfarin (COUMADIN) 5 MG tablet Take 1.5 tablets (7.5 mg total) by mouth daily. 45 tablet 6   No current facility-administered medications for this encounter.    BP 102/65   Pulse (!) 58   Resp 16   Ht 5\' 5"  (1.651 m)   Wt 191 lb 6.4 oz (86.8 kg)   SpO2 98%   BMI 31.85 kg/m   MAP 92 General: NAD Neck: JVP 12 cm, no thyromegaly or thyroid nodule.  Lungs: CTAB  CV: LVAD hum.  1+ edema to knees bilaterally.  No carotid bruit.   Abdomen: Soft, nontender, no hepatosplenomegaly, moderate distention.  Skin: Intact without lesions or rashes.  Neurologic: Alert and oriented x 3.  Psych: Normal affect. Extremities: No clubbing or cyanosis.  HEENT: Normal.   Assessment/Plan: 1. Chronic systolic CHF: Ischemic cardiomyopathy, EF 20-25% with RV dysfunction on 2/18 echo pre-LVAD.  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  Medtronic ICD.  He has felt much better post-LVAD, NYHA class II.  However, recently has developed AKI, now with volume overload after cutting back on torsemide.  LVAD parameters reviewed today and stable.    - Increase torsemide back to 80 mg daily. Will repeat BMET next Tuesday.  If creatinine continues to come down, can add metolazone, perhaps once weekly.  If renal function and volume overload remain problems, will do RHC (decide on this at followup in 2 wks).    - Continue warfarin, no ASA with recent GI bleeding.  - He will be starting cardiac rehab.     2. CAD: s/p CABG.  No chest pain.  He is on atorvastatin.  3. AKI on CKD stage III:  Creatinine up significantly to 3.67, now down to 3.1.  We cut back on his torsemide, but he developed volume overload.  Torsemide 60 mg daily restarted but weight/volume status still up.  As above, increasing torsemide again to 80 mg daily.  He will followup with his nephrologist next Wednesday.  4. Atrial fibrillation: Chronic.  On warfarin.  5. 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  6. HTN: Controlled.  Continue amlodipine  but stop  spironolactone with creatinine > 3.   7. Anemia: Stable, baseline anemia of renal disease/chronic disease but recent bleeding from colonic AVMs.  8. 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  - Continue octreotide injections.  9. ?Cellulitis right foot: Foot looks much better. Now off antibiotics.  10. Rash around driveline site: ? Contact dermatitis.  We have arranged dermatology evaluation.   Loralie Champagne 03/20/2017

## 2017-03-20 NOTE — Patient Instructions (Signed)
Return to clinic Monday for labs and in two weeks for MD visit.  Increase torsemide to 80 mg daily (4 tablets)

## 2017-03-24 ENCOUNTER — Other Ambulatory Visit (HOSPITAL_COMMUNITY): Payer: Self-pay | Admitting: *Deleted

## 2017-03-24 ENCOUNTER — Inpatient Hospital Stay (HOSPITAL_COMMUNITY): Admission: RE | Admit: 2017-03-24 | Payer: Medicare Other | Source: Ambulatory Visit

## 2017-03-24 ENCOUNTER — Ambulatory Visit (HOSPITAL_COMMUNITY)
Admission: RE | Admit: 2017-03-24 | Discharge: 2017-03-24 | Disposition: A | Discharge status: Home / Self Care | Payer: Medicare Other | Source: Ambulatory Visit | Attending: Cardiology | Admitting: Cardiology

## 2017-03-24 ENCOUNTER — Telehealth (HOSPITAL_COMMUNITY): Payer: Self-pay | Admitting: *Deleted

## 2017-03-24 ENCOUNTER — Ambulatory Visit (HOSPITAL_COMMUNITY): Payer: Self-pay | Admitting: Pharmacist

## 2017-03-24 DIAGNOSIS — Z95811 Presence of heart assist device: Secondary | ICD-10-CM

## 2017-03-24 DIAGNOSIS — Z7901 Long term (current) use of anticoagulants: Secondary | ICD-10-CM

## 2017-03-24 DIAGNOSIS — I5042 Chronic combined systolic (congestive) and diastolic (congestive) heart failure: Secondary | ICD-10-CM | POA: Insufficient documentation

## 2017-03-24 DIAGNOSIS — I5043 Acute on chronic combined systolic (congestive) and diastolic (congestive) heart failure: Secondary | ICD-10-CM

## 2017-03-24 LAB — BASIC METABOLIC PANEL
Anion gap: 11 (ref 5–15)
BUN: 48 mg/dL — ABNORMAL HIGH (ref 6–20)
CHLORIDE: 93 mmol/L — AB (ref 101–111)
CO2: 30 mmol/L (ref 22–32)
CREATININE: 3.26 mg/dL — AB (ref 0.61–1.24)
Calcium: 8.7 mg/dL — ABNORMAL LOW (ref 8.9–10.3)
GFR calc non Af Amer: 18 mL/min — ABNORMAL LOW (ref >60)
GFR, EST AFRICAN AMERICAN: 20 mL/min — AB (ref >60)
Glucose, Bld: 223 mg/dL — ABNORMAL HIGH (ref 65–99)
Potassium: 3.5 mmol/L (ref 3.5–5.1)
Sodium: 134 mmol/L — ABNORMAL LOW (ref 135–145)

## 2017-03-24 LAB — CBC
HEMATOCRIT: 26 % — AB (ref 39.0–52.0)
HEMOGLOBIN: 8.6 g/dL — AB (ref 13.0–17.0)
MCH: 30.9 pg (ref 26.0–34.0)
MCHC: 33.1 g/dL (ref 30.0–36.0)
MCV: 93.5 fL (ref 78.0–100.0)
Platelets: 126 10*3/uL — ABNORMAL LOW (ref 150–400)
RBC: 2.78 MIL/uL — ABNORMAL LOW (ref 4.22–5.81)
RDW: 17 % — ABNORMAL HIGH (ref 11.5–15.5)
WBC: 7.2 10*3/uL (ref 4.0–10.5)

## 2017-03-24 LAB — HEPATIC FUNCTION PANEL
ALBUMIN: 3.7 g/dL (ref 3.5–5.0)
ALT: 22 U/L (ref 17–63)
AST: 27 U/L (ref 15–41)
Alkaline Phosphatase: 117 U/L (ref 38–126)
BILIRUBIN INDIRECT: 1 mg/dL — AB (ref 0.3–0.9)
BILIRUBIN TOTAL: 1.2 mg/dL (ref 0.3–1.2)
Bilirubin, Direct: 0.2 mg/dL (ref 0.1–0.5)
Total Protein: 7.2 g/dL (ref 6.5–8.1)

## 2017-03-24 LAB — PROTIME-INR
INR: 3.31
Prothrombin Time: 34.3 seconds — ABNORMAL HIGH (ref 11.4–15.2)

## 2017-03-24 LAB — LACTATE DEHYDROGENASE: LDH: 244 U/L — ABNORMAL HIGH (ref 98–192)

## 2017-03-24 NOTE — Addendum Note (Signed)
Encounter addended by: Effie Berkshire, RN on: 03/24/2017  9:41 AM<BR>    Actions taken: Order list changed, Diagnosis association updated

## 2017-03-24 NOTE — Addendum Note (Signed)
Encounter addended by: Candy Sledge, RN on: 03/24/2017 10:59 AM<BR>    Actions taken: Visit diagnoses modified, Order list changed, Diagnosis association updated

## 2017-03-24 NOTE — Telephone Encounter (Signed)
Notified patient of lab results. Patient reports no signs of bleeding. Notified we have scheduled for a right heart catheterization Thursday 5/24. Instructed patient to arrive in admitting at 0530 and not to eat/drink anything after midnight the night before. May have a sip of water with medications. Patient verbalized understanding.   Balinda Quails RN, VAD Coordinator 24/7 pager [phone]

## 2017-03-27 ENCOUNTER — Inpatient Hospital Stay (HOSPITAL_COMMUNITY): Payer: Medicare Other

## 2017-03-27 ENCOUNTER — Encounter (HOSPITAL_COMMUNITY): Payer: Self-pay | Admitting: Cardiology

## 2017-03-27 ENCOUNTER — Encounter (HOSPITAL_COMMUNITY)
Admission: RE | Disposition: A | Discharge status: Home / Self Care | Payer: Self-pay | Source: Ambulatory Visit | Attending: Cardiology

## 2017-03-27 ENCOUNTER — Inpatient Hospital Stay (HOSPITAL_COMMUNITY)
Admission: RE | Admit: 2017-03-27 | Discharge: 2017-03-29 | DRG: 286 | Disposition: A | Discharge status: Home / Self Care | Payer: Medicare Other | Source: Ambulatory Visit | Attending: Cardiology | Admitting: Cardiology

## 2017-03-27 DIAGNOSIS — J449 Chronic obstructive pulmonary disease, unspecified: Secondary | ICD-10-CM | POA: Diagnosis present

## 2017-03-27 DIAGNOSIS — K5521 Angiodysplasia of colon with hemorrhage: Secondary | ICD-10-CM

## 2017-03-27 DIAGNOSIS — N179 Acute kidney failure, unspecified: Secondary | ICD-10-CM | POA: Diagnosis present

## 2017-03-27 DIAGNOSIS — I5043 Acute on chronic combined systolic (congestive) and diastolic (congestive) heart failure: Secondary | ICD-10-CM

## 2017-03-27 DIAGNOSIS — G4733 Obstructive sleep apnea (adult) (pediatric): Secondary | ICD-10-CM | POA: Diagnosis present

## 2017-03-27 DIAGNOSIS — Z95810 Presence of automatic (implantable) cardiac defibrillator: Secondary | ICD-10-CM

## 2017-03-27 DIAGNOSIS — Z79899 Other long term (current) drug therapy: Secondary | ICD-10-CM | POA: Diagnosis not present

## 2017-03-27 DIAGNOSIS — Z8249 Family history of ischemic heart disease and other diseases of the circulatory system: Secondary | ICD-10-CM

## 2017-03-27 DIAGNOSIS — I482 Chronic atrial fibrillation: Secondary | ICD-10-CM | POA: Diagnosis present

## 2017-03-27 DIAGNOSIS — N189 Chronic kidney disease, unspecified: Secondary | ICD-10-CM

## 2017-03-27 DIAGNOSIS — Q2733 Arteriovenous malformation of digestive system vessel: Secondary | ICD-10-CM

## 2017-03-27 DIAGNOSIS — Z952 Presence of prosthetic heart valve: Secondary | ICD-10-CM | POA: Diagnosis not present

## 2017-03-27 DIAGNOSIS — Z951 Presence of aortocoronary bypass graft: Secondary | ICD-10-CM | POA: Diagnosis not present

## 2017-03-27 DIAGNOSIS — Z955 Presence of coronary angioplasty implant and graft: Secondary | ICD-10-CM

## 2017-03-27 DIAGNOSIS — I5023 Acute on chronic systolic (congestive) heart failure: Secondary | ICD-10-CM | POA: Diagnosis present

## 2017-03-27 DIAGNOSIS — L259 Unspecified contact dermatitis, unspecified cause: Secondary | ICD-10-CM | POA: Diagnosis present

## 2017-03-27 DIAGNOSIS — L03115 Cellulitis of right lower limb: Secondary | ICD-10-CM | POA: Diagnosis present

## 2017-03-27 DIAGNOSIS — Z794 Long term (current) use of insulin: Secondary | ICD-10-CM

## 2017-03-27 DIAGNOSIS — Z7901 Long term (current) use of anticoagulants: Secondary | ICD-10-CM

## 2017-03-27 DIAGNOSIS — N183 Chronic kidney disease, stage 3 (moderate): Secondary | ICD-10-CM | POA: Diagnosis present

## 2017-03-27 DIAGNOSIS — D638 Anemia in other chronic diseases classified elsewhere: Secondary | ICD-10-CM | POA: Diagnosis present

## 2017-03-27 DIAGNOSIS — Z7951 Long term (current) use of inhaled steroids: Secondary | ICD-10-CM

## 2017-03-27 DIAGNOSIS — I2510 Atherosclerotic heart disease of native coronary artery without angina pectoris: Secondary | ICD-10-CM | POA: Diagnosis present

## 2017-03-27 DIAGNOSIS — I1310 Hypertensive heart and chronic kidney disease without heart failure, with stage 1 through stage 4 chronic kidney disease, or unspecified chronic kidney disease: Secondary | ICD-10-CM | POA: Diagnosis present

## 2017-03-27 DIAGNOSIS — I130 Hypertensive heart and chronic kidney disease with heart failure and stage 1 through stage 4 chronic kidney disease, or unspecified chronic kidney disease: Principal | ICD-10-CM | POA: Diagnosis present

## 2017-03-27 DIAGNOSIS — I509 Heart failure, unspecified: Secondary | ICD-10-CM

## 2017-03-27 DIAGNOSIS — I272 Pulmonary hypertension, unspecified: Secondary | ICD-10-CM | POA: Diagnosis present

## 2017-03-27 DIAGNOSIS — E119 Type 2 diabetes mellitus without complications: Secondary | ICD-10-CM

## 2017-03-27 DIAGNOSIS — I360 Nonrheumatic tricuspid (valve) stenosis: Secondary | ICD-10-CM | POA: Diagnosis not present

## 2017-03-27 DIAGNOSIS — E1122 Type 2 diabetes mellitus with diabetic chronic kidney disease: Secondary | ICD-10-CM | POA: Diagnosis present

## 2017-03-27 DIAGNOSIS — Z95811 Presence of heart assist device: Secondary | ICD-10-CM | POA: Diagnosis not present

## 2017-03-27 DIAGNOSIS — I255 Ischemic cardiomyopathy: Secondary | ICD-10-CM | POA: Diagnosis present

## 2017-03-27 DIAGNOSIS — D649 Anemia, unspecified: Secondary | ICD-10-CM | POA: Diagnosis present

## 2017-03-27 HISTORY — PX: RIGHT HEART CATH: CATH118263

## 2017-03-27 LAB — POCT I-STAT 3, VENOUS BLOOD GAS (G3P V)
ACID-BASE EXCESS: 10 mmol/L — AB (ref 0.0–2.0)
Acid-Base Excess: 9 mmol/L — ABNORMAL HIGH (ref 0.0–2.0)
BICARBONATE: 34.4 mmol/L — AB (ref 20.0–28.0)
Bicarbonate: 35.2 mmol/L — ABNORMAL HIGH (ref 20.0–28.0)
O2 Saturation: 53 %
O2 Saturation: 53 %
PCO2 VEN: 50.7 mmHg (ref 44.0–60.0)
PH VEN: 7.44 — AB (ref 7.250–7.430)
PH VEN: 7.448 — AB (ref 7.250–7.430)
PO2 VEN: 27 mmHg — AB (ref 32.0–45.0)
TCO2: 36 mmol/L (ref 0–100)
TCO2: 37 mmol/L (ref 0–100)
pCO2, Ven: 51 mmHg (ref 44.0–60.0)
pO2, Ven: 28 mmHg — CL (ref 32.0–45.0)

## 2017-03-27 LAB — COMPREHENSIVE METABOLIC PANEL
ALBUMIN: 3.7 g/dL (ref 3.5–5.0)
ALT: 20 U/L (ref 17–63)
ANION GAP: 9 (ref 5–15)
AST: 24 U/L (ref 15–41)
Alkaline Phosphatase: 107 U/L (ref 38–126)
BUN: 38 mg/dL — ABNORMAL HIGH (ref 6–20)
CHLORIDE: 95 mmol/L — AB (ref 101–111)
CO2: 32 mmol/L (ref 22–32)
CREATININE: 2.99 mg/dL — AB (ref 0.61–1.24)
Calcium: 8.9 mg/dL (ref 8.9–10.3)
GFR calc non Af Amer: 20 mL/min — ABNORMAL LOW (ref >60)
GFR, EST AFRICAN AMERICAN: 23 mL/min — AB (ref >60)
Glucose, Bld: 210 mg/dL — ABNORMAL HIGH (ref 65–99)
Potassium: 3.2 mmol/L — ABNORMAL LOW (ref 3.5–5.1)
SODIUM: 136 mmol/L (ref 135–145)
Total Bilirubin: 1 mg/dL (ref 0.3–1.2)
Total Protein: 7.2 g/dL (ref 6.5–8.1)

## 2017-03-27 LAB — CBC WITH DIFFERENTIAL/PLATELET
BASOS ABS: 0 10*3/uL (ref 0.0–0.1)
BASOS PCT: 0 %
EOS ABS: 0.2 10*3/uL (ref 0.0–0.7)
Eosinophils Relative: 3 %
HEMATOCRIT: 29.4 % — AB (ref 39.0–52.0)
HEMOGLOBIN: 9.5 g/dL — AB (ref 13.0–17.0)
Lymphocytes Relative: 18 %
Lymphs Abs: 1.4 10*3/uL (ref 0.7–4.0)
MCH: 30.4 pg (ref 26.0–34.0)
MCHC: 32.3 g/dL (ref 30.0–36.0)
MCV: 94.2 fL (ref 78.0–100.0)
MONOS PCT: 10 %
Monocytes Absolute: 0.7 10*3/uL (ref 0.1–1.0)
NEUTROS ABS: 5.3 10*3/uL (ref 1.7–7.7)
NEUTROS PCT: 69 %
Platelets: 120 10*3/uL — ABNORMAL LOW (ref 150–400)
RBC: 3.12 MIL/uL — AB (ref 4.22–5.81)
RDW: 17.3 % — ABNORMAL HIGH (ref 11.5–15.5)
WBC: 7.6 10*3/uL (ref 4.0–10.5)

## 2017-03-27 LAB — GLUCOSE, CAPILLARY
Glucose-Capillary: 217 mg/dL — ABNORMAL HIGH (ref 65–99)
Glucose-Capillary: 212 mg/dL — ABNORMAL HIGH (ref 65–99)
Glucose-Capillary: 203 mg/dL — ABNORMAL HIGH (ref 65–99)
Glucose-Capillary: 279 mg/dL — ABNORMAL HIGH (ref 65–99)

## 2017-03-27 LAB — PROTIME-INR
INR: 2.33
Prothrombin Time: 26 seconds — ABNORMAL HIGH (ref 11.4–15.2)

## 2017-03-27 LAB — MAGNESIUM: MAGNESIUM: 1.9 mg/dL (ref 1.7–2.4)

## 2017-03-27 LAB — LACTATE DEHYDROGENASE: LDH: 242 U/L — ABNORMAL HIGH (ref 98–192)

## 2017-03-27 SURGERY — RIGHT HEART CATH
Anesthesia: LOCAL

## 2017-03-27 MED ORDER — DOCUSATE SODIUM 100 MG PO CAPS
100.0000 mg | ORAL_CAPSULE | Freq: Two times a day (BID) | ORAL | Status: DC
  Administered 2017-03-27 – 2017-03-29 (×4): 100 mg via ORAL
  Filled 2017-03-27 (×4): qty 1

## 2017-03-27 MED ORDER — ALBUTEROL (5 MG/ML) CONTINUOUS INHALATION SOLN
3.0000 mL | INHALATION_SOLUTION | RESPIRATORY_TRACT | Status: DC | PRN
  Filled 2017-03-27: qty 20

## 2017-03-27 MED ORDER — AMLODIPINE BESYLATE 5 MG PO TABS
5.0000 mg | ORAL_TABLET | Freq: Every day | ORAL | Status: DC
  Administered 2017-03-27 – 2017-03-29 (×3): 5 mg via ORAL
  Filled 2017-03-27 (×3): qty 1

## 2017-03-27 MED ORDER — LIDOCAINE HCL (PF) 1 % IJ SOLN
INTRAMUSCULAR | Status: AC
  Filled 2017-03-27: qty 60

## 2017-03-27 MED ORDER — SODIUM CHLORIDE 0.9% FLUSH: 3.0000 mL | INTRAVENOUS | Status: DC | PRN

## 2017-03-27 MED ORDER — METOLAZONE 2.5 MG PO TABS
2.5000 mg | ORAL_TABLET | Freq: Once | ORAL | Status: AC
  Administered 2017-03-27: 2.5 mg via ORAL
  Filled 2017-03-27: qty 1

## 2017-03-27 MED ORDER — TRAMADOL HCL 50 MG PO TABS
50.0000 mg | ORAL_TABLET | Freq: Four times a day (QID) | ORAL | Status: DC | PRN
  Administered 2017-03-28 (×2): 50 mg via ORAL
  Filled 2017-03-27 (×2): qty 1

## 2017-03-27 MED ORDER — HEPARIN (PORCINE) IN NACL 2-0.9 UNIT/ML-% IJ SOLN
INTRAMUSCULAR | Status: AC
  Filled 2017-03-27: qty 500

## 2017-03-27 MED ORDER — INSULIN ASPART 100 UNIT/ML ~~LOC~~ SOLN
0.0000 [IU] | Freq: Three times a day (TID) | SUBCUTANEOUS | Status: DC
  Administered 2017-03-28: 2 [IU] via SUBCUTANEOUS
  Administered 2017-03-28 (×2): 3 [IU] via SUBCUTANEOUS
  Administered 2017-03-29: 2 [IU] via SUBCUTANEOUS
  Administered 2017-03-29: 1 [IU] via SUBCUTANEOUS

## 2017-03-27 MED ORDER — SILDENAFIL CITRATE 20 MG PO TABS
40.0000 mg | ORAL_TABLET | Freq: Three times a day (TID) | ORAL | Status: DC
  Administered 2017-03-27 – 2017-03-29 (×6): 40 mg via ORAL
  Filled 2017-03-27 (×7): qty 2

## 2017-03-27 MED ORDER — ASPIRIN 81 MG PO CHEW
CHEWABLE_TABLET | ORAL | Status: AC
  Filled 2017-03-27: qty 1

## 2017-03-27 MED ORDER — FUROSEMIDE 10 MG/ML IJ SOLN
80.0000 mg | Freq: Two times a day (BID) | INTRAMUSCULAR | Status: DC
  Administered 2017-03-27 – 2017-03-29 (×4): 80 mg via INTRAVENOUS
  Filled 2017-03-27 (×4): qty 8

## 2017-03-27 MED ORDER — TRAZODONE HCL 50 MG PO TABS
50.0000 mg | ORAL_TABLET | Freq: Every evening | ORAL | Status: DC | PRN
  Administered 2017-03-28 (×2): 50 mg via ORAL
  Filled 2017-03-27 (×2): qty 1

## 2017-03-27 MED ORDER — ACETAMINOPHEN 325 MG PO TABS: 650.0000 mg | ORAL_TABLET | ORAL | Status: DC | PRN

## 2017-03-27 MED ORDER — SODIUM CHLORIDE 0.9% FLUSH
3.0000 mL | Freq: Two times a day (BID) | INTRAVENOUS | Status: DC
Start: 2017-03-27 — End: 2017-03-29
  Administered 2017-03-27: 3 mL via INTRAVENOUS

## 2017-03-27 MED ORDER — ALBUTEROL SULFATE (2.5 MG/3ML) 0.083% IN NEBU
INHALATION_SOLUTION | RESPIRATORY_TRACT | Status: AC
  Filled 2017-03-27: qty 3

## 2017-03-27 MED ORDER — HEPARIN (PORCINE) IN NACL 2-0.9 UNIT/ML-% IJ SOLN
INTRAMUSCULAR | Status: AC | PRN
  Administered 2017-03-27: 1000 mL

## 2017-03-27 MED ORDER — INSULIN ASPART 100 UNIT/ML ~~LOC~~ SOLN
0.0000 [IU] | Freq: Every day | SUBCUTANEOUS | Status: DC
  Administered 2017-03-27: 3 [IU] via SUBCUTANEOUS
  Administered 2017-03-28: 2 [IU] via SUBCUTANEOUS

## 2017-03-27 MED ORDER — ONDANSETRON HCL 4 MG/2ML IJ SOLN: 4.0000 mg | Freq: Four times a day (QID) | INTRAMUSCULAR | Status: DC | PRN

## 2017-03-27 MED ORDER — ATORVASTATIN CALCIUM 40 MG PO TABS
40.0000 mg | ORAL_TABLET | Freq: Every day | ORAL | Status: DC
  Administered 2017-03-27 – 2017-03-29 (×3): 40 mg via ORAL
  Filled 2017-03-27 (×3): qty 1

## 2017-03-27 MED ORDER — SODIUM CHLORIDE 0.9% FLUSH
3.0000 mL | INTRAVENOUS | Status: DC | PRN
  Administered 2017-03-29: 3 mL via INTRAVENOUS
  Filled 2017-03-27: qty 3

## 2017-03-27 MED ORDER — GABAPENTIN 600 MG PO TABS
300.0000 mg | ORAL_TABLET | Freq: Two times a day (BID) | ORAL | Status: DC
  Administered 2017-03-27 – 2017-03-29 (×4): 300 mg via ORAL
  Filled 2017-03-27 (×4): qty 1

## 2017-03-27 MED ORDER — WARFARIN - PHARMACIST DOSING INPATIENT: Freq: Every day | Status: DC

## 2017-03-27 MED ORDER — POTASSIUM CHLORIDE CRYS ER 20 MEQ PO TBCR
40.0000 meq | EXTENDED_RELEASE_TABLET | Freq: Once | ORAL | Status: AC
  Administered 2017-03-27: 40 meq via ORAL
  Filled 2017-03-27: qty 2

## 2017-03-27 MED ORDER — BUSPIRONE HCL 5 MG PO TABS
5.0000 mg | ORAL_TABLET | Freq: Two times a day (BID) | ORAL | Status: DC
  Administered 2017-03-27 – 2017-03-29 (×5): 5 mg via ORAL
  Filled 2017-03-27 (×5): qty 1

## 2017-03-27 MED ORDER — SODIUM CHLORIDE 0.9 % IV SOLN
INTRAVENOUS | Status: DC
  Administered 2017-03-27: 07:00:00 via INTRAVENOUS

## 2017-03-27 MED ORDER — ALBUTEROL SULFATE (2.5 MG/3ML) 0.083% IN NEBU
2.5000 mg | INHALATION_SOLUTION | RESPIRATORY_TRACT | Status: DC
  Administered 2017-03-27: 2.5 mg via RESPIRATORY_TRACT

## 2017-03-27 MED ORDER — BENZONATATE 100 MG PO CAPS: 100.0000 mg | ORAL_CAPSULE | Freq: Three times a day (TID) | ORAL | Status: DC | PRN

## 2017-03-27 MED ORDER — WARFARIN SODIUM 5 MG PO TABS
5.0000 mg | ORAL_TABLET | Freq: Once | ORAL | Status: AC
  Administered 2017-03-27: 5 mg via ORAL
  Filled 2017-03-27: qty 1

## 2017-03-27 MED ORDER — LEVOTHYROXINE SODIUM 25 MCG PO TABS
25.0000 ug | ORAL_TABLET | Freq: Every day | ORAL | Status: DC
  Administered 2017-03-28 – 2017-03-29 (×2): 25 ug via ORAL
  Filled 2017-03-27 (×2): qty 1

## 2017-03-27 MED ORDER — POTASSIUM CHLORIDE CRYS ER 20 MEQ PO TBCR: 40.0000 meq | EXTENDED_RELEASE_TABLET | Freq: Two times a day (BID) | ORAL | Status: DC

## 2017-03-27 MED ORDER — LIDOCAINE HCL (PF) 1 % IJ SOLN
INTRAMUSCULAR | Status: DC | PRN
Start: 2017-03-27 — End: 2017-03-27
  Administered 2017-03-27: 40 mL via INTRADERMAL

## 2017-03-27 MED ORDER — VITAMIN C 500 MG PO TABS
1000.0000 mg | ORAL_TABLET | Freq: Every day | ORAL | Status: DC
  Administered 2017-03-28 – 2017-03-29 (×2): 1000 mg via ORAL
  Filled 2017-03-27 (×3): qty 2

## 2017-03-27 MED ORDER — SODIUM CHLORIDE 0.9 % IV SOLN: 250.0000 mL | INTRAVENOUS | Status: DC | PRN

## 2017-03-27 MED ORDER — VITAMIN C 500 MG PO TABS: 1000.0000 mg | ORAL_TABLET | Freq: Every day | ORAL | Status: DC

## 2017-03-27 MED ORDER — ASPIRIN 81 MG PO CHEW
81.0000 mg | CHEWABLE_TABLET | ORAL | Status: AC
  Administered 2017-03-27: 81 mg via ORAL

## 2017-03-27 MED ORDER — BUDESONIDE 0.25 MG/2ML IN SUSP
0.2500 mg | Freq: Two times a day (BID) | RESPIRATORY_TRACT | Status: DC
  Administered 2017-03-28 – 2017-03-29 (×3): 0.25 mg via RESPIRATORY_TRACT
  Filled 2017-03-27 (×3): qty 2

## 2017-03-27 MED ORDER — SODIUM CHLORIDE 0.9% FLUSH
3.0000 mL | Freq: Two times a day (BID) | INTRAVENOUS | Status: DC
  Administered 2017-03-28 – 2017-03-29 (×3): 3 mL via INTRAVENOUS

## 2017-03-27 MED ORDER — ADULT MULTIVITAMIN W/MINERALS CH
1.0000 | ORAL_TABLET | Freq: Every day | ORAL | Status: DC
  Administered 2017-03-27 – 2017-03-29 (×3): 1 via ORAL
  Filled 2017-03-27 (×3): qty 1

## 2017-03-27 MED ORDER — PANTOPRAZOLE SODIUM 40 MG PO TBEC
40.0000 mg | DELAYED_RELEASE_TABLET | Freq: Every day | ORAL | Status: DC
  Administered 2017-03-27 – 2017-03-29 (×3): 40 mg via ORAL
  Filled 2017-03-27 (×3): qty 1

## 2017-03-27 SURGICAL SUPPLY — 11 items
CATH BALLN WEDGE 5F 110CM (CATHETERS) ×2 IMPLANT
CATH SWAN GANZ 7F STRAIGHT (CATHETERS) ×2 IMPLANT
GUIDEWIRE .025 260CM (WIRE) ×2 IMPLANT
PACK CARDIAC CATHETERIZATION (CUSTOM PROCEDURE TRAY) ×2 IMPLANT
PROTECTION STATION PRESSURIZED (MISCELLANEOUS) ×2
SHEATH GLIDE SLENDER 4/5FR (SHEATH) ×4 IMPLANT
SHEATH PINNACLE 7F 10CM (SHEATH) ×2 IMPLANT
STATION PROTECTION PRESSURIZED (MISCELLANEOUS) ×1 IMPLANT
TRANSDUCER W/STOPCOCK (MISCELLANEOUS) ×2 IMPLANT
TUBING CIL FLEX 10 FLL-RA (TUBING) ×2 IMPLANT
WIRE EMERALD 3MM-J .025X260CM (WIRE) ×2 IMPLANT

## 2017-03-27 NOTE — H&P (Signed)
Advanced Heart Failure VAD History and Physical Note   Reason for Admission: Acute on chronic systolic HF in and LVAD patient.   HPI:    Trevor [name] is a 71 y.o. male with h/o of CAD s/p CABG x 4 2010, OSA, AS with TAVR 2015, CKD, DM2, PAF, Asthma, and chronic systolic HF with EF 42-68% with RV dysfunction, s/p Medtronic ICD, and s/p HM2 LVAD 12/2016 for DT.   Previously admitted in 02/2017 with lower GI bleeding. Had 4 units PRBCs and colonoscopy that showed colonic AVM treated with APC and clipping. ASA stopped.   Seen in clinic 03/20/17 and with ongoing volume overload and AKI, was scheduled for RHC. Findings as below.  Weight has remained elevated at home.  Has increased dyspnea with walking up stairs, but OK for the most part on flat ground.  Denies orthopnea or PND. Denies use of NSAIDs. MAPs have been stable and he has been planning on starting Cardiac rehab.  Denies fever, chills, N/V, or diarrhea.   RHC Procedural Findings: RA mean 16 RV 60/15 PA 55/22, mean 33 PCWP mean 23 Oxygen saturations: PA 53% AO 97% Cardiac Output (Fick) 4.99  Cardiac Index (Fick) 2.59 PVR 3.87 WU Cardiac Output (Thermo) 5.88 Cardiac Index (Thermo) 3.05  PVR 1.7 WU  LVAD INTERROGATION:  HeartMate II LVAD:  Flow 5.2 liters/min, speed 9000, power 5.5, PI 6.4.     Review of systems complete and found to be negative unless listed in HPI.    Home Medications Prior to Admission medications   Medication Sig Start Date End Date Taking? Authorizing Provider  albuterol (PROVENTIL HFA;VENTOLIN HFA) 108 (90 Base) MCG/ACT inhaler Inhale 2 puffs into the lungs every 4 (four) hours as needed for wheezing or shortness of breath. 11/27/16  Yes Byrum, Rose Fillers, MD  amLODipine (NORVASC) 5 MG tablet Take 1 tablet (5 mg total) by mouth daily. 02/21/17  Yes Clegg, Amy D, NP  ascorbic acid (VITAMIN C) 1000 MG tablet Take 1,000 mg by mouth daily at 12 noon.    Yes [provider]  atorvastatin  (LIPITOR) 40 MG tablet Take 1 tablet (40 mg total) by mouth daily. 01/01/17  Yes Clegg, Amy D, NP  busPIRone (BUSPAR) 5 MG tablet Take 1 tablet (5 mg total) by mouth 2 (two) times daily. Patient taking differently: Take 5 mg by mouth 2 (two) times daily. Morning & bedtime 01/01/17  Yes Clegg, Amy D, NP  DOK 100 MG capsule TAKE 2 CAPSULES(200 MG) BY MOUTH DAILY Patient taking differently: TAKE 1 CAPSULES(100 MG) BY MOUTH TWICE DAILY (MORNING & NOON) 03/14/17  Yes Larey Dresser, MD  fluticasone (FLOVENT HFA) 110 MCG/ACT inhaler Inhale 2 puffs into the lungs 2 (two) times daily. Patient taking differently: Inhale 2 puffs into the lungs 2 (two) times daily. MORNING & BEDTIME 11/27/16  Yes [name], MD  gabapentin (NEURONTIN) 600 MG tablet Take 0.5 tablets (300 mg total) by mouth 2 (two) times daily. 01/01/17  Yes Clegg, Amy D, NP  insulin glargine (LANTUS) 100 unit/mL SOPN Inject 0.2 mLs (20 Units total) into the skin at bedtime. 01/10/17  Yes Larey Dresser, MD  levothyroxine (SYNTHROID, LEVOTHROID) 25 MCG tablet Take 1 tablet (25 mcg total) by mouth daily before breakfast. 01/01/17  Yes Clegg, Amy D, NP  Multiple Vitamin (MULTIVITAMIN WITH MINERALS) TABS tablet Take 1 tablet by mouth daily at 12 noon.   Yes [provider]  pantoprazole (PROTONIX) 40 MG tablet Take 1 tablet (40  mg total) by mouth daily at 12 noon. 01/01/17  Yes Clegg, Amy D, NP  sildenafil (REVATIO) 20 MG tablet Take 1 tablet (20 mg total) by mouth 3 (three) times daily. Patient taking differently: Take 20 mg by mouth 3 (three) times daily. MORNING, AFTERNOON, & BEDTIME 01/01/17  Yes Clegg, Amy D, NP  torsemide (DEMADEX) 20 MG tablet Take 4 tablets (80 mg total) by mouth daily. 03/20/17 08/02/17 Yes Larey Dresser, MD  traMADol (ULTRAM) 50 MG tablet TAKE 1 TABLET BY MOUTH EVERY 6 HOURS AS NEEDED FOR MODERATE PAIN 03/06/17  Yes Shirley Friar, PA-C  traZODone (DESYREL) 50 MG tablet TAKE 1 TABLET(50 MG) BY MOUTH AT  BEDTIME AS NEEDED FOR SLEEP 01/22/17  Yes Clegg, Amy D, NP  warfarin (COUMADIN) 5 MG tablet Take 1.5 tablets (7.5 mg total) by mouth daily. Patient taking differently: Take 5-7.5 mg by mouth See admin instructions. Take 1.5 tablets (7.5 mg) on Monday, Wednesday, Friday & Sunday; then take 1 tablet (5 mg) on Tuesday, Thursday & Saturday 03/13/17  Yes Larey Dresser, MD  albuterol (PROVENTIL) (2.5 MG/3ML) 0.083% nebulizer solution Take 3 mLs (2.5 mg total) by nebulization every 4 (four) hours. And as needed 11/27/16   [name], MD  benzonatate (TESSALON) 100 MG capsule Take 1 capsule (100 mg total) by mouth 3 (three) times daily as needed for cough. 01/23/17   Larey Dresser, MD    Past Medical History: Past Medical History:  Diagnosis Date  . AICD (automatic cardioverter/defibrillator) present   . Asthma   . CHF (congestive heart failure) ([place])   . Diabetes ([place])   . Kidney disease   . Presence of permanent cardiac pacemaker    AICD  . Sleep apnea     Past Surgical History: Past Surgical History:  Procedure Laterality Date  . CARDIAC CATHETERIZATION N/A 12/02/2016   Procedure: Right Heart Cath;  Surgeon: Larey Dresser, MD;  Location: [facility];  Service: Cardiovascular;  Laterality: N/A;  . COLONOSCOPY N/A 02/14/2017   Procedure: COLONOSCOPY;  Surgeon: Milus Banister, MD;  Location: [facility];  Service: Endoscopy;  Laterality: N/A;  . IABP INSERTION N/A 12/11/2016   Procedure: IABP Insertion;  Surgeon: Larey Dresser, MD;  Location: [facility];  Service: Cardiovascular;  Laterality: N/A;  . INSERTION OF IMPLANTABLE LEFT VENTRICULAR ASSIST DEVICE N/A 12/13/2016   Procedure: INSERTION OF IMPLANTABLE LEFT VENTRICULAR ASSIST DEVICE;  Surgeon: Ivin Poot, MD;  Location: [facility];  Service: Open Heart Surgery;  Laterality: N/A;  HEARTMATE II  NITRIC OXIDE  . RIGHT HEART CATH N/A 12/11/2016   Procedure: Right Heart Cath;  Surgeon: Larey Dresser, MD;  Location: [facility];  Service: Cardiovascular;  Laterality: N/A;  . TEE WITHOUT CARDIOVERSION N/A 12/13/2016   Procedure: TRANSESOPHAGEAL ECHOCARDIOGRAM (TEE);  Surgeon: Ivin Poot, MD;  Location: [facility];  Service: Open Heart Surgery;  Laterality: N/A;  . TRICUSPID VALVE REPLACEMENT N/A 12/13/2016   Procedure: TRICUSPID VALVE REPAIR WITH EDWARDS MC 3 TRICUSPID ANNULOPLASTY RING MODEL 4900 SIZE T 28;  Surgeon: Ivin Poot, MD;  Location: [facility];  Service: Open Heart Surgery;  Laterality: N/A;    Family History: Family History  Problem Relation Age of Onset  . Heart failure Father   . Heart attack Father   . Healthy Mother   . Diabetes Paternal Grandfather     Social History: Social History   Social History  . Marital status:  Widowed    Spouse name: N/A  . Number of children: 0  . Years of education: 38   Social History Main Topics  . Smoking status: Never Smoker  . Smokeless tobacco: Never Used  . Alcohol use No  . Drug use: No  . Sexual activity: Not Currently   Other Topics Concern  . Not on file   Social History Narrative   Patient is a widow. Moved to [place] from [place], [place]. Currently lives with his sister Kelvis Berger.    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     Allergies:  No Known Allergies  Objective:    Vital Signs:   Temp:  [97.9 F (36.6 C)] 97.9 F (36.6 C) (05/24 0555) Pulse Rate:  [0-192] 0 (05/24 0902) Resp:  [4-37] 13 (05/24 0902) BP: (119)/(88) 119/88 (05/24 0555) SpO2:  [0 %-99 %] 0 % (05/24 0902) Weight:  [190 lb (86.2 kg)] 190 lb (86.2 kg) (05/24 0555)   Filed Weights   03/27/17 0555  Weight: 190 lb (86.2 kg)   Mean arterial Pressure 80  Physical Exam: General:  Well appearing. NAD.  HEENT: Normal Neck: supple. JVP 11-12 cm. Carotids 2+ bilat; no bruits. No lymphadenopathy or thyromegaly appreciated. Cor: Mechanical heart sounds with LVAD hum present. Lungs: Clear Abdomen: soft, nontender, nondistended. No  hepatosplenomegaly. No bruits or masses. Good bowel sounds. Driveline: C/D/I; securement device intact and driveline incorporated Extremities: no cyanosis, clubbing, or rash. 1+ edema to knees bilaterally. Neuro: alert & orientedx3, cranial nerves grossly intact. moves all 4 extremities w/o difficulty. Affect pleasant  Telemetry: Not yet connected  Labs: Basic Metabolic Panel:  Recent Labs Lab 03/24/17 0930  NA 134*  K 3.5  CL 93*  CO2 30  GLUCOSE 223*  BUN 48*  CREATININE 3.26*  CALCIUM 8.7*    Liver Function Tests:  Recent Labs Lab 03/24/17 0930  AST 27  ALT 22  ALKPHOS 117  BILITOT 1.2  PROT 7.2  ALBUMIN 3.7   No results for input(s): LIPASE, AMYLASE in the last 168 hours. No results for input(s): AMMONIA in the last 168 hours.  CBC:  Recent Labs Lab 03/24/17 0930  WBC 7.2  HGB 8.6*  HCT 26.0*  MCV 93.5  PLT 126*    Cardiac Enzymes: No results for input(s): CKTOTAL, CKMB, CKMBINDEX, TROPONINI in the last 168 hours.  BNP: BNP (last 3 results)  Recent Labs  11/28/16 1104 12/14/16 0348  BNP 1,825.3* 427.7*    ProBNP (last 3 results)  Recent Labs  11/27/16 1207  PROBNP 1,746.0*     CBG:  Recent Labs Lab 03/27/17 0710  GLUCAP 217*    Coagulation Studies:  Recent Labs  03/24/17 0930 03/27/17 0624  LABPROT 34.3* 26.0*  INR 3.31 2.33    Other results: EKG: Pending.   Imaging:  No results found.    Assessment/Plan   1. Acute on chronic systolic CHF s/p HM II LVAD 12/2016 s/p Medtronic ICD - ICM EF 20-25% with RV dysfunction on 2/18 echo pre-LVAD  - Volume overloaded on RHC with RA 16, good cardiac output.  Start lasix IV 80 mg BID and give 2.5 mg metolazone - Await BMET for K supp.  - Continue coumadin. No ASA with GI bleed. - Needs to start cardiac rehab - Ramp echo tomorrow.  2. CAD s/p CABG - No chest pain.  - Continue statin 3. AKI on CKD stage III. Cause of acute rise several weeks ago uncertain.  - BMET  today.  - Has been elevated > 3 recently. Follow closely with diuresis, creatinine may improve with drop in renal venous pressure.  4. Afib, Chronic - Continue coumadin 5. RV failure - Diurese. Increase revatio to 40 mg TID.  6. HTN - Stable. FOllow 7. Anemia - No overt bleeding. CBC today 8. GI Bleeding - Colonic AVMs on colonoscopy s/p APC + clipping 02/2017 admission - Continue octreotide injections 9. Rash around driveline site - ? Contact dermatitis. Has been referred to dermatology.   I reviewed the LVAD parameters from today, and compared the results to the patient's prior recorded data.  No programming changes were made.  The LVAD is functioning within specified parameters.  The patient performs LVAD self-test daily.  LVAD interrogation was negative for any significant power changes, alarms or PI events/speed drops.  LVAD equipment check completed and is in good working order.  Back-up equipment present.   LVAD education done on emergency procedures and precautions and reviewed exit site care.  Length of Stay: 0  Annamaria Helling 03/27/2017, 9:17 AM  VAD Team Pager [phone] (7am - 7am) +++VAD ISSUES ONLY+++  Advanced Heart Failure Team Pager [phone] (M-F; [place])  Please contact [facility] for night-coverage after hours (4p -7a ) and weekends on [email or web address] for all non- LVAD Issues  Patient seen with PA, agree with the above note.   See above RHC.   I am going to admit Mr Keeter for diuresis.  R>L heart failure by RHC, cardiac output preserved.  He has uncomfortable lower extremity edema and weight has gone up at home despite increasing torsemide.  Creatinine has remained stably elevated over the last couple weeks, uncertain cause of acute rise in creatinine.  He saw renal earlier this week.  - Lasix 80 mg IV bid + metolazone 2.5 x 1 today.  Follow creatinine closely may improve with lowering of renal venous pressure.  - Increase Revatio to 40 mg tid to help  unload RV.  - Will plan ramp study, suspect we will not increase the speed however as this seems to be primarily an RV failure issue and cardiac output was good on RHC.   Check CBC, has stopped ASA due to AVM-related bleeding.  Continues on warfarin with goal INR 2-2.5.   Loralie Champagne 03/27/2017 9:44 AM

## 2017-03-27 NOTE — Progress Notes (Addendum)
ANTICOAGULATION CONSULT NOTE - Initial Consult  Pharmacy Consult for warfarin   Indication: LVAD  No Known Allergies  Patient Measurements: Height: 5\' 5"  (165.1 cm) Weight: 190 lb (86.2 kg) IBW/kg (Calculated) : 61.5  Vital Signs: Temp: 97.9 F (36.6 C) (05/24 0555) Temp Source: Oral (05/24 0555) BP: 120/0 (05/24 0950) Pulse Rate: 69 (05/24 0950)  Labs:  Recent Labs  03/27/17 0624  LABPROT 26.0*  INR 2.33    Estimated Creatinine Clearance: 21 mL/min (A) (by C-G formula based on SCr of 3.26 mg/dL (H)).   Medical History: Past Medical History:  Diagnosis Date  . AICD (automatic cardioverter/defibrillator) present   . Asthma   . CHF (congestive heart failure) ([place])   . Diabetes ([place])   . Kidney disease   . Presence of permanent cardiac pacemaker    AICD  . Sleep apnea    Assessment: 70 y.o. 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  Patient seen in clinic yesterday with volume overload and AKI now s/p RHC confirming volume. Admit for IV diuresis. Will continue his warfarin. INR this am is at goal 2.3, note history of recent GIB and taken off aspirin.  Patient's INR was elevated on last two outpatient visits, dose held Monday and weekly dose reduced to 7.5mg  daily and 5mg  on Tuesday, Thursday, and Saturday.   Goal of Therapy:  INR goal 2-2.5 Monitor platelets by anticoagulation protocol: Yes   Plan:  Warfarin 5mg  tonight Daily INR for now  Erin Hearing PharmD., BCPS Clinical Pharmacist Pager [phone] 03/27/2017 10:16 AM

## 2017-03-27 NOTE — Interval H&P Note (Signed)
History and Physical Interval Note:  03/27/2017 7:50 AM  Trevor [name]  has presented today for surgery, with the diagnosis of chf  The various methods of treatment have been discussed with the patient and family. After consideration of risks, benefits and other options for treatment, the patient has consented to  Procedure(s): Right Heart Cath (N/A) as a surgical intervention .  [name] history has been reviewed, patient examined, no change in status, stable for surgery.  I have reviewed the patient's chart and labs.  Questions were answered to the patient's satisfaction.     Fallen Crisostomo [employer]

## 2017-03-27 NOTE — Progress Notes (Signed)
Site area: rt ac venous sheath Site Prior to Removal:  Level 0 Pressure Applied For:  15 minutes Manual:   yes Patient Status During Pull:  stable Post Pull Site:  Level 0 Post Pull Instructions Given:  yes Post Pull Pulses Present: yes Dressing Applied:  Gauze and tegaderm Bedrest begins @  Comments:

## 2017-03-27 NOTE — Progress Notes (Signed)
Admitted 03/27/2017 due to acute on chronic systolic heart failure.   HeartMate II LVAD implated on 12/13/2016 by Dr. Prescott Gum.   Vital signs: HR: 70 Doppler Pressure:108 Automatic BP: 110/93 (100) O2 Sat: 100 on RA Wt:190 lbs     LVAD interrogation reveals:  Speed:9000 Flow: 4.9 Power:  6 PI:5.9  Alarms: none Events:  Rare PI events Fixed speed: 9000 Low speed limit: 8400  Drive Line: will change today. Patient is every other day dressing changes. Sterile saline wipes only.  Labs:  LDH trend:242  INR trend: 2.33  Anticoagulation Plan: -INR Goal: 2-2.5 -ASA Dose: none due to recent GIB  Adverse Events on VAD: -02/2017>GIB, 4 units PRBC, clips x2 to AVM  Plan/Recommendations:   1. Please changed drive line dressing every other day using the daily kits. Only use sterile saline as patient has skin sensitivity to CHG.  2. RAMP echo scheduled for 1000 tomorrow.  Balinda Quails RN, VAD Coordinator 24/7 pager [phone]

## 2017-03-27 NOTE — Progress Notes (Signed)
Site area: rt groin fv sheath Site Prior to Removal:  Level 0 Pressure Applied For:  15 minutes Manual:   yes Patient Status During Pull:  stable Post Pull Site:  Level  0 Post Pull Instructions Given:  yes Post Pull Pulses Present: yes Dressing Applied:  Gauze and tegaderm Bedrest begins @  1010 Comments:

## 2017-03-27 NOTE — H&P (View-Only) (Signed)
Patient presents for 1 week  follow up in [facility] today. Reports no problems with VAD equipment or concerns with drive line.   Vital Signs:  Doppler Pressure 80   Automatc BP: 102/65 (78) HR:58   SPO2:98  %  Weight: 191.4 lb w/o eqt Last weight: 189.6 lb Home weights: 190 lbs   VAD Indication: DT- age excluding    VAD interrogation & Equipment Management: Speed:9000 Flow: 5.4 Power:5.5 w    PI:6.7  Alarms: no clinical alarms Events: none  Fixed speed 9000 Low speed limit: 8400  Exit Site Care: Drive line is being maintained every other day  by [name]. Drive line exit site well healed and incorporated. The velour is fully implanted at exit site. Dressing dry and intact. Large scab noted at site. Stabilization device present and accurately applied. Pt denies fever or chills. Pt states they have adequate dressing supplies at home.    Significant Events on VAD Support:  02/2017> GIB- AVM clipped x2  Device:Medtronic Therapies: on Last check: 02/2017  BP & Labs:  MAP80 - Doppler is reflecting MAP  Hgb 9.0 - No S/S of bleeding. Specifically denies melena/BRBPR or nosebleeds.  LDH stable at 249 with established baseline of 225- 300. Denies tea-colored urine. No power elevations noted on interrogation.    Patient unable to complete 6 minute walk test due to fluid gain and shob with exertion.   Neurocognitive trail making completed correctly in 1.5 minutes.   [employer].omyopathy, EQ-5D-3L and post-VAD QOL completed by the patient independently   Plan- 1. Appointment with Dr Hollie Salk Wednesday 2. RTC next week for labs and in two weeks for VAD visit 3. Cardiac rehab begins in 2 weeks. 4. Increase Torsemide to 80 mg daily  Balinda Quails RN VAD Coordinator   Office: [phone] 24/7 Emergency VAD Pager: [phone]     Cardiology: Dr. [name] a 71 y.o.malewith a history of CAD s/p CABG x 4 2010, OSA, AS with TAVR 2015,  CKD, DM2, paroxysmal atrial fibrillation, asthma, and ischemic cardiomyopathy with Medtronic ICD.   Relocated to [place] from [place] in early 2018. Over the last couple of years, he had been admitted multiple times for CHF. Last admission in [place] was in 1/18. Sounds like the hospitalization was complicated by cardiorenal syndrome and there was consideration of doing dialysis.  He was admitted in to [facility] 11/27/16 with NYHA class IV symptoms and hypotension. He was placed on dual inotropes to facilitate diuresis, cardiac output and renal function.  CT surgery consulted for mechanical support and he was deemed appropriate for LVAD work up. He completed LVAD work up and was approved for HMII LVAD --> DT. On 12/11/16, he had IABP placed to optimize cardiac output and improve renal function. He then underwent HMII LVAD placement + tricuspid valve repair on 12/14/2015. Post operatively pressors weaned off slowly. He required significant diuresis.  Course was complicated by RV dysfunction and renal dysfunction.  Patient was admitted with lower GI bleeding in 4/18.  He had been on ASA 325 (increased with mild LDH elevation) and warfarin INR 2-2.5.  He had 4 units PRBCs total.  Colonoscopy showed colonic AVM treated with APC and clipping.  He was sent home off ASA and on warfarin INR goal 2-2.5.   Recently, creatinine was noted to increase as high as 3.67.  I cut back on his torsemide but weight went up considerably.  I increased torsemide back to 60 mg daily.  Weight has not come down, but creatinine has come down to 3.1. He has increased dyspnea with more strenuous activity like walking up stairs, but he is still able to walk on flat ground without dyspnea. No orthopnea/PND.  He does not use NSAIDs.  Only new medications have been doxycycline and then Keflex for possible cellulitis on his right foot, he is now off both. MAP 80 today, LVAD parameters stable.  He will start cardiac rehab soon.    Labs (3/18): LDH 331 => 401 => 443 => 366 => 313, K 3.6 => 3.2 => 3.5, creatinine 1.98 => 1.74 => 1.8 => 1.79, hgb 8.3 => 8.9 => 8.7 => 8.4, INR 2.11 => 2.4 Labs (4/18): hgb 9.4 Labs (5/18): LDH 269 => 249, INR 2.57, K 4.5, creatinine 1.92 => 2.71 => 3.67 => 3.43 => 3.1, hgb 9, plts 104  PMH: 1. CAD: s/p CABG in 2010.  2. Aortic stenosis: s/p TAVR in 2015. Valve looked ok on 2/18 echo.  3. Atrial fibrillation: Chronic.  4. Type II diabetes.  5. CKD: Stage III, cardiorenal syndrome.  6. Chronic systolic CHF: Ischemic cardiomyopathy with prominent RV dysfunction.  Medtronic ICD.  - Echo (2/18): EF 20-25%, moderately dilated LV, moderately dilated/moderately dysfunctional RV, severe TR.  - Cardiogenic shock 2/18 requiring milrinone, norepinephrine, and IABP.  - Heartmate II LVAD for DT placed 12/13/16.   7. OSA.  8. Thrombocytopenia: Post-op LVAD, resolved.  9. Tricuspid regurgitation: Severe, s/p repair with LVAD placement in 2/18.  10. Lower GI bleed (4/18): Colonic AVM, treated with APC and clipping.   Social History   Social History  . Marital status: Widowed    Spouse name: N/A  . Number of children: 0  . Years of education: 14   Occupational History  . Not on file.   Social History Main Topics  . Smoking status: Never Smoker  . Smokeless tobacco: Never Used  . Alcohol use No  . Drug use: No  . Sexual activity: Not Currently   Other Topics Concern  . Not on file   Social History Narrative   Patient is a widow. Moved to [place] from [place], [place]. Currently lives with his sister Dean Goldner.    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    Family History  Problem Relation Age of Onset  . Heart failure Father   . Heart attack Father   . Healthy Mother   . Diabetes Paternal Grandfather    ROS: All systems reviewed and negative except as per HPI.   Current Outpatient Prescriptions  Medication Sig Dispense Refill  . albuterol (PROVENTIL  HFA;VENTOLIN HFA) 108 (90 Base) MCG/ACT inhaler Inhale 2 puffs into the lungs every 4 (four) hours as needed for wheezing or shortness of breath. 1 Inhaler 5  . albuterol (PROVENTIL) (2.5 MG/3ML) 0.083% nebulizer solution Take 3 mLs (2.5 mg total) by nebulization every 4 (four) hours. And as needed 150 mL 5  . amLODipine (NORVASC) 5 MG tablet Take 1 tablet (5 mg total) by mouth daily. 30 tablet 6  . ascorbic acid (VITAMIN C) 1000 MG tablet Take 1,000 mg by mouth daily.    Marland Kitchen atorvastatin (LIPITOR) 40 MG tablet Take 1 tablet (40 mg total) by mouth daily. 30 tablet 6  . benzonatate (TESSALON) 100 MG capsule Take 1 capsule (100 mg total) by mouth 3 (three) times daily as needed for cough. 20 capsule 6  . busPIRone (BUSPAR) 5 MG tablet Take 1 tablet (5 mg total)  by mouth 2 (two) times daily. 60 tablet 6  . DOK 100 MG capsule TAKE 2 CAPSULES(200 MG) BY MOUTH DAILY 30 capsule 0  . fluticasone (FLOVENT HFA) 110 MCG/ACT inhaler Inhale 2 puffs into the lungs 2 (two) times daily. 1 Inhaler 5  . gabapentin (NEURONTIN) 600 MG tablet Take 0.5 tablets (300 mg total) by mouth 2 (two) times daily. 60 tablet 6  . insulin glargine (LANTUS) 100 unit/mL SOPN Inject 0.2 mLs (20 Units total) into the skin at bedtime. 15 mL 0  . levothyroxine (SYNTHROID, LEVOTHROID) 25 MCG tablet Take 1 tablet (25 mcg total) by mouth daily before breakfast. 30 tablet 6  . Multiple Vitamin (MULTIVITAMIN) tablet Take 1 tablet by mouth daily.    . pantoprazole (PROTONIX) 40 MG tablet Take 1 tablet (40 mg total) by mouth daily at 12 noon. 30 tablet 6  . sildenafil (REVATIO) 20 MG tablet Take 1 tablet (20 mg total) by mouth 3 (three) times daily. 90 tablet 6  . torsemide (DEMADEX) 20 MG tablet Take 4 tablets (80 mg total) by mouth daily. 90 tablet 5  . traMADol (ULTRAM) 50 MG tablet TAKE 1 TABLET BY MOUTH EVERY 6 HOURS AS NEEDED FOR MODERATE PAIN 30 tablet 2  . traZODone (DESYREL) 50 MG tablet TAKE 1 TABLET(50 MG) BY MOUTH AT BEDTIME AS NEEDED  FOR SLEEP 30 tablet 3  . warfarin (COUMADIN) 5 MG tablet Take 1.5 tablets (7.5 mg total) by mouth daily. 45 tablet 6   No current facility-administered medications for this encounter.    BP 102/65   Pulse (!) 58   Resp 16   Ht 5\' 5"  (1.651 m)   Wt 191 lb 6.4 oz (86.8 kg)   SpO2 98%   BMI 31.85 kg/m   MAP 92 General: NAD Neck: JVP 12 cm, no thyromegaly or thyroid nodule.  Lungs: CTAB  CV: LVAD hum.  1+ edema to knees bilaterally.  No carotid bruit.   Abdomen: Soft, nontender, no hepatosplenomegaly, moderate distention.  Skin: Intact without lesions or rashes.  Neurologic: Alert and oriented x 3.  Psych: Normal affect. Extremities: No clubbing or cyanosis.  HEENT: Normal.   Assessment/Plan: 1. Chronic systolic CHF: Ischemic cardiomyopathy, EF 20-25% with RV dysfunction on 2/18 echo pre-LVAD.  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  Medtronic ICD.  He has felt much better post-LVAD, NYHA class II.  However, recently has developed AKI, now with volume overload after cutting back on torsemide.  LVAD parameters reviewed today and stable.    - Increase torsemide back to 80 mg daily. Will repeat BMET next Tuesday.  If creatinine continues to come down, can add metolazone, perhaps once weekly.  If renal function and volume overload remain problems, will do RHC (decide on this at followup in 2 wks).    - Continue warfarin, no ASA with recent GI bleeding.  - He will be starting cardiac rehab.     2. CAD: s/p CABG.  No chest pain.  He is on atorvastatin.  3. AKI on CKD stage III:  Creatinine up significantly to 3.67, now down to 3.1.  We cut back on his torsemide, but he developed volume overload.  Torsemide 60 mg daily restarted but weight/volume status still up.  As above, increasing torsemide again to 80 mg daily.  He will followup with his nephrologist next Wednesday.  4. Atrial fibrillation: Chronic.  On warfarin.  5. 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  6. HTN: Controlled.  Continue amlodipine  but stop  spironolactone with creatinine > 3.   7. Anemia: Stable, baseline anemia of renal disease/chronic disease but recent bleeding from colonic AVMs.  8. 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  - Continue octreotide injections.  9. ?Cellulitis right foot: Foot looks much better. Now off antibiotics.  10. Rash around driveline site: ? Contact dermatitis.  We have arranged dermatology evaluation.   Loralie Champagne 03/20/2017

## 2017-03-28 ENCOUNTER — Inpatient Hospital Stay (HOSPITAL_COMMUNITY): Payer: Medicare Other

## 2017-03-28 DIAGNOSIS — I360 Nonrheumatic tricuspid (valve) stenosis: Secondary | ICD-10-CM

## 2017-03-28 DIAGNOSIS — I5023 Acute on chronic systolic (congestive) heart failure: Secondary | ICD-10-CM

## 2017-03-28 LAB — CBC
HEMATOCRIT: 25.6 % — AB (ref 39.0–52.0)
HEMOGLOBIN: 8.4 g/dL — AB (ref 13.0–17.0)
MCH: 31 pg (ref 26.0–34.0)
MCHC: 32.8 g/dL (ref 30.0–36.0)
MCV: 94.5 fL (ref 78.0–100.0)
Platelets: 115 10*3/uL — ABNORMAL LOW (ref 150–400)
RBC: 2.71 MIL/uL — AB (ref 4.22–5.81)
RDW: 17.2 % — ABNORMAL HIGH (ref 11.5–15.5)
WBC: 7.7 10*3/uL (ref 4.0–10.5)

## 2017-03-28 LAB — PROTIME-INR
INR: 2.14
Prothrombin Time: 24.2 seconds — ABNORMAL HIGH (ref 11.4–15.2)

## 2017-03-28 LAB — BASIC METABOLIC PANEL
ANION GAP: 9 (ref 5–15)
BUN: 38 mg/dL — AB (ref 6–20)
CHLORIDE: 94 mmol/L — AB (ref 101–111)
CO2: 32 mmol/L (ref 22–32)
Calcium: 8.8 mg/dL — ABNORMAL LOW (ref 8.9–10.3)
Creatinine, Ser: 2.92 mg/dL — ABNORMAL HIGH (ref 0.61–1.24)
GFR calc Af Amer: 23 mL/min — ABNORMAL LOW (ref >60)
GFR, EST NON AFRICAN AMERICAN: 20 mL/min — AB (ref >60)
GLUCOSE: 210 mg/dL — AB (ref 65–99)
POTASSIUM: 3.2 mmol/L — AB (ref 3.5–5.1)
Sodium: 135 mmol/L (ref 135–145)

## 2017-03-28 LAB — GLUCOSE, CAPILLARY
GLUCOSE-CAPILLARY: 240 mg/dL — AB (ref 65–99)
Glucose-Capillary: 188 mg/dL — ABNORMAL HIGH (ref 65–99)
Glucose-Capillary: 246 mg/dL — ABNORMAL HIGH (ref 65–99)
Glucose-Capillary: 244 mg/dL — ABNORMAL HIGH (ref 65–99)

## 2017-03-28 LAB — IRON AND TIBC
IRON: 37 ug/dL — AB (ref 45–182)
Saturation Ratios: 15 % — ABNORMAL LOW (ref 17.9–39.5)
TIBC: 244 ug/dL — AB (ref 250–450)
UIBC: 207 ug/dL

## 2017-03-28 LAB — FERRITIN: FERRITIN: 602 ng/mL — AB (ref 24–336)

## 2017-03-28 LAB — LACTATE DEHYDROGENASE: LDH: 209 U/L — ABNORMAL HIGH (ref 98–192)

## 2017-03-28 LAB — ECHOCARDIOGRAM LIMITED
Height: 65 in
WEIGHTICAEL: 2916.8 [oz_av]

## 2017-03-28 MED ORDER — WARFARIN SODIUM 7.5 MG PO TABS
7.5000 mg | ORAL_TABLET | Freq: Once | ORAL | Status: AC
  Administered 2017-03-28: 7.5 mg via ORAL
  Filled 2017-03-28: qty 1

## 2017-03-28 MED ORDER — METOLAZONE 2.5 MG PO TABS
2.5000 mg | ORAL_TABLET | Freq: Once | ORAL | Status: AC
  Administered 2017-03-28: 2.5 mg via ORAL
  Filled 2017-03-28: qty 1

## 2017-03-28 MED ORDER — POTASSIUM CHLORIDE CRYS ER 20 MEQ PO TBCR
40.0000 meq | EXTENDED_RELEASE_TABLET | Freq: Once | ORAL | Status: AC
  Administered 2017-03-28: 40 meq via ORAL
  Filled 2017-03-28: qty 2

## 2017-03-28 MED ORDER — INSULIN GLARGINE 100 UNIT/ML ~~LOC~~ SOLN
10.0000 [IU] | Freq: Every day | SUBCUTANEOUS | Status: DC
  Administered 2017-03-28: 10 [IU] via SUBCUTANEOUS
  Filled 2017-03-28 (×2): qty 0.1

## 2017-03-28 NOTE — Progress Notes (Signed)
Inpatient Diabetes Program Recommendations  AACE/ADA: New Consensus Statement on Inpatient Glycemic Control (2015)  Target Ranges:  Prepandial:   less than 140 mg/dL      Peak postprandial:   less than 180 mg/dL (1-2 hours)      Critically ill patients:  140 - 180 mg/dL   Lab Results  Component Value Date   GLUCAP 279 (H) 03/27/2017   HGBA1C 6.7 (H) 03/17/2017    Review of Glycemic Control Results for Trevor [name], Trevor [name] (MRN [id number]) as of 03/28/2017 11:31  Ref. Range 03/27/2017 07:10 03/27/2017 09:55 03/27/2017 11:40 03/27/2017 21:22  Glucose-Capillary Latest Ref Range: 65 - 99 mg/dL 217 (H) 212 (H) 203 (H) 279 (H)   Diabetes history: DM2 Outpatient Diabetes medications: Lantus 20 units  Current orders for Inpatient glycemic control: Novolog correction 0-9 units tid + 0-5 units hs  Inpatient Diabetes Program Recommendations:    Please consider 50% basal home dose of Lantus while in the hospital. Will follow.  Thank you, Nani Gasser. Christion Leonhard, RN, MSN, CDE  Diabetes Coordinator Inpatient Glycemic Control Team Team Pager [phone] (8am-5pm) 03/28/2017 11:53 AM

## 2017-03-28 NOTE — Progress Notes (Signed)
  Echocardiogram 2D Echocardiogram limited  has been performed.  [name] 03/28/2017, 12:18 PM

## 2017-03-28 NOTE — Progress Notes (Signed)
Speed  Flow  PI  Power  LVIDD  AI  Aortic openings  MR  TR  Septum  RV Dop MAP Auto BP  9000 5.7 6.1 5.5 5.4cm trivial 0/5 mild trivial Bow to RV Mod 98 93/75 (82)  9200  5.3 6.2 5.5 5.3 trivial 0/5 mild Trivial  midline   92/75 (82)  9400  5.7 5.4 6.0 5.3 trivial 0/5 mild trivial midline   93/78  (84)  9600   5.7 5.4 6.4 5.2 trivial 0/5 mild trivial Sl pull to LV   95/80 (87)  9800  5.6 4.4 6.5 5.1 trivial 0/5 mild trivial Bow to LV   95/75 (83)                    Ramp ECHO performed at bedside with [name].  At completion of ramp study, patients primary and back up controller programmed:  Fixed speed:  9200 Low speed limit: 8600  [name] RN, VAD Coordinator 24/7 pager [phone]

## 2017-03-28 NOTE — Progress Notes (Signed)
ANTICOAGULATION CONSULT NOTE - Initial Consult  Pharmacy Consult for warfarin   Indication: LVAD  No Known Allergies  Patient Measurements: Height: 5\' 5"  (165.1 cm) Weight: 182 lb 4.8 oz (82.7 kg) IBW/kg (Calculated) : 61.5  Vital Signs: Temp: 98 F (36.7 C) (05/25 0535) Temp Source: Oral (05/25 0535) Pulse Rate: 87 (05/25 0535)  Labs:  Recent Labs  03/27/17 0624 03/27/17 1214 03/28/17 0241  HGB  --  9.5* 8.4*  HCT  --  29.4* 25.6*  PLT  --  120* 115*  LABPROT 26.0*  --  24.2*  INR 2.33  --  2.14  CREATININE  --  2.99* 2.92*    Estimated Creatinine Clearance: 23 mL/min (A) (by C-G formula based on SCr of 2.92 mg/dL (H)).   Medical History: Past Medical History:  Diagnosis Date  . AICD (automatic cardioverter/defibrillator) present   . Asthma   . CHF (congestive heart failure) ([place])   . Diabetes ([place])   . Kidney disease   . Presence of permanent cardiac pacemaker    AICD  . Sleep apnea    Assessment: 71 y.o. male with h/o of CAD s/p CABG x 4 2010, OSA, AS with TAVR 2015, CKD, DM2, PAF, Asthma, and chronic systolic HF with EF 67-34% with RV dysfunction, s/p Medtronic ICD, and s/p HM2 LVAD 12/2016 for DT.  Patient seen in clinic on 5/23 with volume overload and AKI now s/p RHC, confirming extra volume. Admitted for IV diuresis. Will continue his warfarin. INR this am slightly decreased last 24 hours 2.33>>2.15 but still at goal, note history of recent GIB and taken off aspirin.  Patient's INR was elevated on last two outpatient visits, dose held Monday and weekly dose reduced to 7.5mg  daily and 5mg  on Tuesday, Thursday, and Saturday.   Goal of Therapy:  INR goal 2-2.5 Monitor platelets by anticoagulation protocol: Yes   Plan:  Warfarin 7.5mg  tonight Daily INR Monitor S/S bleeding  Myer Peer Grayland Ormond), PharmD  PGY1 Pharmacy Resident Pager: [phone] 03/28/2017 7:07 AM

## 2017-03-28 NOTE — Progress Notes (Signed)
Patient ID: Trevor [name], male   DOB: 06/20/1946, 71 y.o.   MRN: [id number] HeartMate 2 Rounding Note  Subjective:    Patient diuresed well yesterday, weight down.  Legs not as swollen, feeling better.  Creatinine 2.99 => 2.92.   RHC Procedural Findings (5/24): Hemodynamics (mmHg) RA mean 16 RV 60/15 PA 55/22, mean 33 PCWP mean 23 Oxygen saturations: PA 53% AO 97% Cardiac Output (Fick) 4.99  Cardiac Index (Fick) 2.59 PVR 3.87 WU Cardiac Output (Thermo) 5.88 Cardiac Index (Thermo) 3.05  PVR 1.7 WU  LVAD INTERROGATION:  HeartMate II LVAD:  Flow 4.8 liters/min, speed 9000, power 5.1, PI 6.5.    Objective:    Vital Signs:   Temp:  [98 F (36.7 C)-98.1 F (36.7 C)] 98 F (36.7 C) (05/25 0535) Pulse Rate:  [0-192] 87 (05/25 0535) Resp:  [7-37] 18 (05/25 0535) BP: (110-120)/(0-93) 110/93 (05/24 1135) SpO2:  [0 %-100 %] 99 % (05/25 0535) Weight:  [182 lb 4.8 oz (82.7 kg)] 182 lb 4.8 oz (82.7 kg) (05/25 0535) Last BM Date: 03/27/17 Mean arterial Pressure 82  Intake/Output:   Intake/Output Summary (Last 24 hours) at 03/28/17 0755 Last data filed at 03/28/17 0536  Gross per 24 hour  Intake                0 ml  Output             2900 ml  Net            -2900 ml     Physical Exam: General:  Well appearing. No resp difficulty HEENT: normal Neck: supple. JVP 10-12 cm. No lymphadenopathy or thryomegaly appreciated. Cor: Mechanical heart sounds with LVAD hum present. Lungs: clear bilaterally Abdomen: soft, nontender, nondistended. No hepatosplenomegaly. No bruits or masses. Good bowel sounds. Driveline: C/D/I; securement device intact and driveline incorporated Extremities: no cyanosis, clubbing, rash.  1+ ankle edema.  Wearing ted hose.  Neuro: alert & orientedx3, cranial nerves grossly intact. moves all 4 extremities w/o difficulty. Affect pleasant  Telemetry: Personally reviewed, atrial fibrillation.   Labs: Basic Metabolic Panel:  Recent Labs Lab  03/24/17 0930 03/27/17 1214 03/28/17 0241  NA 134* 136 135  K 3.5 3.2* 3.2*  CL 93* 95* 94*  CO2 30 32 32  GLUCOSE 223* 210* 210*  BUN 48* 38* 38*  CREATININE 3.26* 2.99* 2.92*  CALCIUM 8.7* 8.9 8.8*  MG  --  1.9  --     Liver Function Tests:  Recent Labs Lab 03/24/17 0930 03/27/17 1214  AST 27 24  ALT 22 20  ALKPHOS 117 107  BILITOT 1.2 1.0  PROT 7.2 7.2  ALBUMIN 3.7 3.7   No results for input(s): LIPASE, AMYLASE in the last 168 hours. No results for input(s): AMMONIA in the last 168 hours.  CBC:  Recent Labs Lab 03/24/17 0930 03/27/17 1214 03/28/17 0241  WBC 7.2 7.6 7.7  NEUTROABS  --  5.3  --   HGB 8.6* 9.5* 8.4*  HCT 26.0* 29.4* 25.6*  MCV 93.5 94.2 94.5  PLT 126* 120* 115*    INR:  Recent Labs Lab 03/24/17 0930 03/27/17 0624 03/28/17 0241  INR 3.31 2.33 2.14    Other results:  EKG:   Imaging: Dg Chest 2 View  Result Date: 03/27/2017 CLINICAL DATA:  Acute on chronic heart failure.  Initial encounter. EXAM: CHEST  2 VIEW COMPARISON:  Chest radiograph performed 02/13/2017 FINDINGS: The lungs are well-aerated and clear. There is no evidence of focal opacification, pleural  effusion or pneumothorax. The heart is borderline normal in size. An AICD is noted at the right chest wall, with a single lead ending at the right ventricle. A left ventricular assist device is noted. A valve replacement is noted. The patient is status post median sternotomy. No acute osseous abnormalities are seen. IMPRESSION: No acute cardiopulmonary process seen. Electronically Signed   By: Garald Balding M.D.   On: 03/27/2017 19:31      Medications:     Scheduled Medications: . amLODipine  5 mg Oral Daily  . atorvastatin  40 mg Oral Daily  . budesonide  0.25 mg Nebulization BID  . busPIRone  5 mg Oral BID  . docusate sodium  100 mg Oral BID  . furosemide  80 mg Intravenous BID  . gabapentin  300 mg Oral BID  . insulin aspart  0-5 Units Subcutaneous QHS  . insulin  aspart  0-9 Units Subcutaneous TID WC  . levothyroxine  25 mcg Oral QAC breakfast  . multivitamin with minerals  1 tablet Oral Q1200  . pantoprazole  40 mg Oral Q1200  . potassium chloride  40 mEq Oral Once  . potassium chloride  40 mEq Oral Once  . sildenafil  40 mg Oral TID  . sodium chloride flush  3 mL Intravenous Q12H  . sodium chloride flush  3 mL Intravenous Q12H  . vitamin C  1,000 mg Oral Daily  . warfarin  7.5 mg Oral ONCE-1800  . Warfarin - Pharmacist Dosing Inpatient   Does not apply q1800     Infusions: . sodium chloride    . sodium chloride 10 mL/hr at 03/27/17 0657  . sodium chloride       PRN Medications:  sodium chloride, sodium chloride, acetaminophen, albuterol, benzonatate, ondansetron (ZOFRAN) IV, ondansetron (ZOFRAN) IV, sodium chloride flush, sodium chloride flush, traMADol, traZODone   Assessment/Plan:   1. Acute on chronic systolic CHF: Prominent RV dysfunction.  Ischemic cardiomyopathy, EF 20-25% with RV dysfunction on 2/18 echo pre-LVAD.  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  Medtronic ICD.  He has felt much better post-LVAD, NYHA class II. However, recently developed AKI and then volume overload after cutting back on torsemide.  R>L heart failure by RHC on 5/24, cardiac output preserved.  Creatinine now starting to trend down.  He diuresed well yesterday with metolazone.  MAP ok in 80s.  - Continue Lasix 80 mg IV bid today, will give a dose of metolazone 2.5 again.  Will replace K aggressively.  - Continue warfarin, no ASA with recent GI bleeding.  - I increased sildenafil to 40 mg tid to support RV.      - Will do ramp echo today.  With prominent RV failure, probably will not increase speed.  2. CAD: s/p CABG.  No chest pain.  He is on atorvastatin.  3. AKI on CKD stage III:  Creatinine up significantly to 3.67, now down to 2.92.  He saw renal as an outpatient earlier this week. We cut back on his torsemide, but he developed volume overload.  Now diuresing, and  as noted creatinine trending down.  Possibly, he developed ATN in setting of GI bleeding at last admission and this is just now beginning to improve.  4. Atrial fibrillation: Chronic.  On warfarin.  5. 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  6. HTN: Controlled.    7. 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  8. 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  - Continue octreotide injections.  - Will send Fe studies today.   I reviewed the LVAD parameters from today, and compared the results to the patient's prior recorded data.  No programming changes were made.  The LVAD is functioning within specified parameters.  The patient performs LVAD self-test daily.  LVAD interrogation was negative for any significant power changes, alarms or PI events/speed drops.  LVAD equipment check completed and is in good working order.  Back-up equipment present.   LVAD education done on emergency procedures and precautions and reviewed exit site care.  Length of Stay: [place] 03/28/2017, 7:55 AM  VAD Team --- VAD ISSUES ONLY--- Pager [phone] (7am - 7am)  Advanced Heart Failure Team  Pager [phone] (M-F; 7a - 4p)  Please contact [facility] for night-coverage after hours (4p -7a ) and weekends on [email or web address]

## 2017-03-29 ENCOUNTER — Other Ambulatory Visit: Payer: Self-pay | Admitting: Nurse Practitioner

## 2017-03-29 LAB — CBC
HEMATOCRIT: 26.4 % — AB (ref 39.0–52.0)
Hemoglobin: 8.6 g/dL — ABNORMAL LOW (ref 13.0–17.0)
MCH: 30.7 pg (ref 26.0–34.0)
MCHC: 32.6 g/dL (ref 30.0–36.0)
MCV: 94.3 fL (ref 78.0–100.0)
PLATELETS: 116 10*3/uL — AB (ref 150–400)
RBC: 2.8 MIL/uL — AB (ref 4.22–5.81)
RDW: 17.2 % — ABNORMAL HIGH (ref 11.5–15.5)
WBC: 7.1 10*3/uL (ref 4.0–10.5)

## 2017-03-29 LAB — BASIC METABOLIC PANEL
ANION GAP: 11 (ref 5–15)
BUN: 38 mg/dL — AB (ref 6–20)
CO2: 32 mmol/L (ref 22–32)
Calcium: 9 mg/dL (ref 8.9–10.3)
Chloride: 92 mmol/L — ABNORMAL LOW (ref 101–111)
Creatinine, Ser: 2.93 mg/dL — ABNORMAL HIGH (ref 0.61–1.24)
GFR calc Af Amer: 23 mL/min — ABNORMAL LOW (ref >60)
GFR, EST NON AFRICAN AMERICAN: 20 mL/min — AB (ref >60)
GLUCOSE: 170 mg/dL — AB (ref 65–99)
POTASSIUM: 3.4 mmol/L — AB (ref 3.5–5.1)
Sodium: 135 mmol/L (ref 135–145)

## 2017-03-29 LAB — GLUCOSE, CAPILLARY
Glucose-Capillary: 132 mg/dL — ABNORMAL HIGH (ref 65–99)
Glucose-Capillary: 170 mg/dL — ABNORMAL HIGH (ref 65–99)

## 2017-03-29 LAB — PROTIME-INR
INR: 2.06
Prothrombin Time: 23.5 seconds — ABNORMAL HIGH (ref 11.4–15.2)

## 2017-03-29 LAB — LACTATE DEHYDROGENASE: LDH: 217 U/L — AB (ref 98–192)

## 2017-03-29 MED ORDER — POTASSIUM CHLORIDE CRYS ER 20 MEQ PO TBCR
40.0000 meq | EXTENDED_RELEASE_TABLET | Freq: Three times a day (TID) | ORAL | Status: DC
  Administered 2017-03-29: 40 meq via ORAL
  Filled 2017-03-29: qty 2

## 2017-03-29 MED ORDER — SILDENAFIL CITRATE 20 MG PO TABS: 40.0000 mg | ORAL_TABLET | Freq: Three times a day (TID) | ORAL | 6 refills | Status: DC

## 2017-03-29 MED ORDER — METOLAZONE 2.5 MG PO TABS: 2.5000 mg | ORAL_TABLET | ORAL | 3 refills | Status: DC

## 2017-03-29 MED ORDER — POTASSIUM CHLORIDE CRYS ER 20 MEQ PO TBCR: EXTENDED_RELEASE_TABLET | ORAL | 3 refills | Status: DC

## 2017-03-29 MED ORDER — WARFARIN SODIUM 5 MG PO TABS: 5.0000 mg | ORAL_TABLET | Freq: Once | ORAL | Status: DC

## 2017-03-29 NOTE — Discharge Instructions (Signed)
***  PLEASE REMEMBER TO BRING ALL OF YOUR MEDICATIONS TO EACH OF YOUR FOLLOW-UP OFFICE VISITS.  

## 2017-03-29 NOTE — Progress Notes (Signed)
03/29/2017 2:45 PM Discharge AVS meds taken today and those due this evening reviewed.  Follow-up appointments and when to call md reviewed.  D/C IV and TELE.  Questions and concerns addressed.   D/C home per orders. [place]

## 2017-03-29 NOTE — Care Management Note (Signed)
Case Management Note  Patient Details  Name: Trevor [name] MRN: [id number] Date of Birth: September 29, 1946  Subjective/Objective:                 Patient with order to DC to home. No CM needs orders or consult identified at this time.    Action/Plan:   Expected Discharge Date:  03/29/17               Expected Discharge Plan:  Home/Self Care  In-House Referral:     Discharge planning Services  CM Consult  Post Acute Care Choice:    Choice offered to:     DME Arranged:    DME Agency:     HH Arranged:    HH Agency:     Status of Service:  Completed, signed off  If discussed at [employer] of Stay Meetings, dates discussed:    Additional Comments:  Carles Collet, RN 03/29/2017, 2:04 PM

## 2017-03-29 NOTE — Progress Notes (Signed)
Patient ID: Trevor [name], male   DOB: Oct 29, 1946, 71 y.o.   MRN: [id number] HeartMate 2 Rounding Note  Subjective:    Patient diuresed well again yesterday, weight down 3 more lbs.  Legs not as swollen, feeling better.  Creatinine 2.99 => 2.92 => 2.93.   Ramp echo 5/25, speed increased to 9200 rpm.   RHC Procedural Findings (5/24): Hemodynamics (mmHg) RA mean 16 RV 60/15 PA 55/22, mean 33 PCWP mean 23 Oxygen saturations: PA 53% AO 97% Cardiac Output (Fick) 4.99  Cardiac Index (Fick) 2.59 PVR 3.87 WU Cardiac Output (Thermo) 5.88 Cardiac Index (Thermo) 3.05  PVR 1.7 WU  LVAD INTERROGATION:  HeartMate II LVAD:  Flow 5.4 liters/min, speed 9200, power 5.2, PI 6.4.    Objective:    Vital Signs:   Temp:  [97.9 F (36.6 C)-98.3 F (36.8 C)] 98 F (36.7 C) (05/26 0500) Pulse Rate:  [64-96] 65 (05/26 0500) Resp:  [18] 18 (05/26 0500) BP: (98-110)/(0-93) 98/0 (05/26 1032) SpO2:  [95 %-100 %] 95 % (05/26 0844) Weight:  [179 lb 11.2 oz (81.5 kg)] 179 lb 11.2 oz (81.5 kg) (05/26 0500) Last BM Date: 03/28/17 Mean arterial Pressure 80s  Intake/Output:   Intake/Output Summary (Last 24 hours) at 03/29/17 1059 Last data filed at 03/29/17 1037  Gross per 24 hour  Intake                6 ml  Output             3750 ml  Net            -3744 ml     Physical Exam: General:  Well appearing. No resp difficulty HEENT: normal Neck: supple. JVP 8 cm. No lymphadenopathy or thryomegaly appreciated. Cor: Mechanical heart sounds with LVAD hum present. Lungs: clear bilaterally Abdomen: soft, nontender, nondistended. No hepatosplenomegaly. No bruits or masses. Good bowel sounds. Driveline: C/D/I; securement device intact and driveline incorporated Extremities: no cyanosis, clubbing, rash.  Trace ankle edema.  Wearing ted hose.  Neuro: alert & orientedx3, cranial nerves grossly intact. moves all 4 extremities w/o difficulty. Affect pleasant  Telemetry: Personally reviewed, atrial  fibrillation.   Labs: Basic Metabolic Panel:  Recent Labs Lab 03/24/17 0930 03/27/17 1214 03/28/17 0241 03/29/17 0225  NA 134* 136 135 135  K 3.5 3.2* 3.2* 3.4*  CL 93* 95* 94* 92*  CO2 30 32 32 32  GLUCOSE 223* 210* 210* 170*  BUN 48* 38* 38* 38*  CREATININE 3.26* 2.99* 2.92* 2.93*  CALCIUM 8.7* 8.9 8.8* 9.0  MG  --  1.9  --   --     Liver Function Tests:  Recent Labs Lab 03/24/17 0930 03/27/17 1214  AST 27 24  ALT 22 20  ALKPHOS 117 107  BILITOT 1.2 1.0  PROT 7.2 7.2  ALBUMIN 3.7 3.7   No results for input(s): LIPASE, AMYLASE in the last 168 hours. No results for input(s): AMMONIA in the last 168 hours.  CBC:  Recent Labs Lab 03/24/17 0930 03/27/17 1214 03/28/17 0241 03/29/17 0225  WBC 7.2 7.6 7.7 7.1  NEUTROABS  --  5.3  --   --   HGB 8.6* 9.5* 8.4* 8.6*  HCT 26.0* 29.4* 25.6* 26.4*  MCV 93.5 94.2 94.5 94.3  PLT 126* 120* 115* 116*    INR:  Recent Labs Lab 03/24/17 0930 03/27/17 0624 03/28/17 0241 03/29/17 0225  INR 3.31 2.33 2.14 2.06    Other results:  EKG:   Imaging: Dg Chest 2  View  Result Date: 03/27/2017 CLINICAL DATA:  Acute on chronic heart failure.  Initial encounter. EXAM: CHEST  2 VIEW COMPARISON:  Chest radiograph performed 02/13/2017 FINDINGS: The lungs are well-aerated and clear. There is no evidence of focal opacification, pleural effusion or pneumothorax. The heart is borderline normal in size. An AICD is noted at the right chest wall, with a single lead ending at the right ventricle. A left ventricular assist device is noted. A valve replacement is noted. The patient is status post median sternotomy. No acute osseous abnormalities are seen. IMPRESSION: No acute cardiopulmonary process seen. Electronically Signed   By: Garald Balding M.D.   On: 03/27/2017 19:31     Medications:     Scheduled Medications: . amLODipine  5 mg Oral Daily  . atorvastatin  40 mg Oral Daily  . budesonide  0.25 mg Nebulization BID  .  busPIRone  5 mg Oral BID  . docusate sodium  100 mg Oral BID  . furosemide  80 mg Intravenous BID  . gabapentin  300 mg Oral BID  . insulin aspart  0-5 Units Subcutaneous QHS  . insulin aspart  0-9 Units Subcutaneous TID WC  . insulin glargine  10 Units Subcutaneous QHS  . levothyroxine  25 mcg Oral QAC breakfast  . multivitamin with minerals  1 tablet Oral Q1200  . pantoprazole  40 mg Oral Q1200  . potassium chloride  40 mEq Oral TID  . sildenafil  40 mg Oral TID  . sodium chloride flush  3 mL Intravenous Q12H  . sodium chloride flush  3 mL Intravenous Q12H  . vitamin C  1,000 mg Oral Daily  . warfarin  5 mg Oral ONCE-1800  . Warfarin - Pharmacist Dosing Inpatient   Does not apply q1800    Infusions: . sodium chloride    . sodium chloride 10 mL/hr at 03/27/17 0657  . sodium chloride      PRN Medications: sodium chloride, sodium chloride, acetaminophen, albuterol, benzonatate, ondansetron (ZOFRAN) IV, ondansetron (ZOFRAN) IV, sodium chloride flush, sodium chloride flush, traMADol, traZODone   Assessment/Plan:   1. Acute on chronic systolic CHF: Prominent RV dysfunction.  Ischemic cardiomyopathy, EF 20-25% with RV dysfunction on 2/18 echo pre-LVAD.  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  Medtronic ICD.  He has felt much better post-LVAD, NYHA class II. However, recently developed AKI and then volume overload after cutting back on torsemide.  R>L heart failure by RHC on 5/24, cardiac output preserved.  Creatinine has trended down closer to his baseline.  He diuresed well yesterday again with metolazone.  MAP ok in 80s. Volume looks much better today.  Speed of LVAD increased to 9200 yesterday with ramp echo, tolerated without problems.   - Can stop IV Lasix, transition back to torsemide 80 mg daily starting tomorrow.  He will start metolazone 2.5 mg once a week.  Take KCl 40 daily, extra 20 on metolazone days.   - Continue warfarin, no ASA with recent GI bleeding.  - I increased sildenafil to  40 mg tid to support RV.     - He may be able to increase LVAD speed further based on ramp echo, but given RV failure will take this slowly.  2. CAD: s/p CABG.  No chest pain.  He is on atorvastatin.  3. AKI on CKD stage III:  Creatinine up significantly to 3.67, now down to 2.93.  He saw renal as an outpatient earlier this week. We cut back on his torsemide, but he  developed volume overload.  Now diuresing, and as noted creatinine trending down.  Possibly, he developed ATN in setting of GI bleeding at last admission and this is just now beginning to improve.  4. Atrial fibrillation: Chronic.  On warfarin.  5. 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  6. HTN: Controlled.    7. 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  8. 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  - Continue octreotide injections => needs to reschedule the injection he missed this week.  9. Disposition: Home today.  Needs followup VAD clinic in 10-14 days.  Needs BMET next Thursday (here to start cardiac rehab that day).  Needs to reschedule octreotide injection this coming week.  Coumadin followup with LVAD clinic.  Meds for home: warfarin (INR goal 2-2.5), torsemide 80 mg daily, KCl 40 mEq daily, metolazone 2.5 mg once a week (he can pick a day, would not take before Tuesday), take extra 20 mEq KCl on metolazone days, sildenafil 40 mg tid (increased), amlodipine 5, atorvastatin 40.   I reviewed the LVAD parameters from today, and compared the results to the patient's prior recorded data.  No programming changes were made.  The LVAD is functioning within specified parameters.  The patient performs LVAD self-test daily.  LVAD interrogation was negative for any significant power changes, alarms or PI events/speed drops.  LVAD equipment check completed and is in good working order.  Back-up equipment present.   LVAD education  done on emergency procedures and precautions and reviewed exit site care.  Length of Stay: 2  Loralie Champagne 03/29/2017, 10:59 AM  VAD Team --- VAD ISSUES ONLY--- Pager [phone] (7am - 7am)  Advanced Heart Failure Team  Pager [phone] (M-F; 7a - 4p)  Please contact [facility] for night-coverage after hours (4p -7a ) and weekends on [email or web address]

## 2017-03-29 NOTE — Progress Notes (Signed)
ANTICOAGULATION CONSULT NOTE   Pharmacy Consult for warfarin   Indication: LVAD  No Known Allergies  Patient Measurements: Height: 5\' 5"  (165.1 cm) Weight: 179 lb 11.2 oz (81.5 kg) IBW/kg (Calculated) : 61.5  Vital Signs: Temp: 98 F (36.7 C) (05/26 0500) Temp Source: Oral (05/26 0500) Pulse Rate: 65 (05/26 0500)  Labs:  Recent Labs  03/27/17 0624  03/27/17 1214 03/28/17 0241 03/29/17 0225  HGB  --   < > 9.5* 8.4* 8.6*  HCT  --   --  29.4* 25.6* 26.4*  PLT  --   --  120* 115* 116*  LABPROT 26.0*  --   --  24.2* 23.5*  INR 2.33  --   --  2.14 2.06  CREATININE  --   --  2.99* 2.92* 2.93*  < > = values in this interval not displayed.  Estimated Creatinine Clearance: 22.7 mL/min (A) (by C-G formula based on SCr of 2.93 mg/dL (H)).   Medical History: Past Medical History:  Diagnosis Date  . AICD (automatic cardioverter/defibrillator) present   . Asthma   . CHF (congestive heart failure) ([place])   . Diabetes ([place])   . Kidney disease   . Presence of permanent cardiac pacemaker    AICD  . Sleep apnea    Assessment: 71 y.o. male with h/o of CAD s/p CABG x 4 2010, OSA, AS with TAVR 2015, CKD, DM2, PAF, Asthma, and chronic systolic HF with EF 37-35% with RV dysfunction, s/p Medtronic ICD, and s/p HM2 LVAD 12/2016 for DT.  Patient seen in clinic on 5/23 with volume overload and AKI now s/p RHC, confirming extra volume. Admitted for IV diuresis. Will continue his warfarin. INR this am slightly decreased last 24 hours 2.33>>2.15 but still at goal, note history of recent GIB and taken off aspirin.  Patient's INR was elevated on last two outpatient visits, dose held Monday and weekly dose reduced to 7.5mg  daily and 5mg  on Tuesday, Thursday, and Saturday.   Goal of Therapy:  INR goal 2-2.5 Monitor platelets by anticoagulation protocol: Yes   Plan:  Warfarin 5mg  tonight (consistent with new home dose) Daily INR Monitor S/S bleeding  Uvaldo Rising, BCPS   Clinical Pharmacist Pager [phone]  03/29/2017 8:18 AM

## 2017-03-29 NOTE — Discharge Summary (Signed)
Discharge Summary    Patient ID: [name],  MRN: [id number], DOB/AGE: 05/21/1946 71 y.o.  Admit date: 03/27/2017 Discharge date: 03/29/2017  Primary Care Provider: Golden [name] Primary Cardiologist: Trevor Crow, MD   Discharge Diagnoses    Principal Problem:   Acute on chronic systolic (congestive) heart failure (HCC)  **Minus 6.6L this admission.  **Wt down from 190 lbs on admission to 179 lbs at discharge.  Active Problems:   Cardiomyopathy, ischemic   Coronary artery disease   Hx of CABG   Status post aortic valve replacement   Acute on chronic kidney failure (HCC)   Cardiorenal syndrome with renal failure   Presence of left ventricular assist device (LVAD) (HCC)   LVAD (left ventricular assist device) present (HCC)   Type 2 diabetes mellitus (HCC)   OSA (obstructive sleep apnea)   COPD (chronic obstructive pulmonary disease) (HCC)   AVM (arteriovenous malformation) of colon with hemorrhage  Allergies No Known Allergies  Diagnostic Studies/Procedures    Cardiac Catheterization 5.24.2018  Right Heart Pressures RHC Procedural Findings: Hemodynamics (mmHg) RA mean 16 RV 60/15 PA 55/22, mean 33 PCWP mean 23  Oxygen saturations: PA 53% AO 97%  Cardiac Output (Fick) 4.99  Cardiac Index (Fick) 2.59 PVR 3.87 WU  Cardiac Output (Thermo) 5.88 Cardiac Index (Thermo) 3.05  PVR 1.7 WU  _____________   2D Echocardiogram 5.25.2018  Study Conclusions  - Left ventricle: The cavity size was moderately dilated. Systolic   function was severely reduced. The estimated ejection fraction   was in the range of 20% to 25%. Diffuse hypokinesis. - Aortic valve: There was trivial regurgitation. - Aortic root: The aortic root was mildly dilated. - Mitral valve: Calcified annulus. There was mild regurgitation. - Left atrium: The atrium was severely dilated. - Right ventricle: The cavity size was severely dilated. Systolic   function was severely reduced. -  Right atrium: The atrium was severely dilated. - Tricuspid valve: Prior procedures included surgical repair.  Impressions:  - Limited RAMP study for LVAD; severe global reduction in LV   systolic function; LVAD at LV apex; moderate LVE; mildly dilated   aortic root; mild MR; severe LAE; severe RVE with severely   reduced function; severe RAE; s/p TV repair with mild TR. _____________   History of Present Illness     Trevor [name] is a 71 y.o. male with h/o of CAD s/p CABG x 4 2010, OSA, AS with TAVR 2015, CKD, DM2, PAF, Asthma, and chronic systolic HF with EF 78-29% with RV dysfunction, s/p [employer] ICD, and s/p HM2 LVAD 12/2016 for DT. Previously admitted in 02/2017 with lower GI bleeding. Had 4 units PRBCs and colonoscopy that showed colonic AVM treated with APC and clipping. ASA stopped. He was seen in clinic 03/20/17 and with ongoing volume overload and AKI, and was scheduled for right heart cath.  Hospital Course     Consultants: None   Patient underwent RHC on 5/24, revealing elevated filling pressures.  He was admitted and placed on IV lasix.  Ramp echo was performed on 5/25 with results outlined above.  LVAD speed was increased to 9200 rpm.  Pt responded well to lasix with significant improvement in volume status and 11 lbs wt loss.  Creatinine improved since admission, and is 2.93 this morning.  He will be discharged home today in good condition.  We will transition him back to torsemide 80 mg daily with metolzone 2.5 mg weekly.  He will take additional potassium  on metolazone days.  His revatio dose has been doubled to 40 mg TID.  He will f/u in LVAD clinic with bmet and INR on 5/31. _____________  Discharge Vitals Blood pressure (!) 98/0, pulse 65, temperature 98 F (36.7 C), temperature source Oral, resp. rate 18, height 5\' 5"  (1.651 m), weight 179 lb 11.2 oz (81.5 kg), SpO2 95 %.  Filed Weights   03/27/17 0555 03/28/17 0535 03/29/17 0500  Weight: 190 lb (86.2 kg) 182 lb 4.8  oz (82.7 kg) 179 lb 11.2 oz (81.5 kg)    Labs & Radiologic Studies    CBC  Recent Labs  03/27/17 1214 03/28/17 0241 03/29/17 0225  WBC 7.6 7.7 7.1  NEUTROABS 5.3  --   --   HGB 9.5* 8.4* 8.6*  HCT 29.4* 25.6* 26.4*  MCV 94.2 94.5 94.3  PLT 120* 115* 409*   Basic Metabolic Panel  Recent Labs  03/27/17 1214 03/28/17 0241 03/29/17 0225  NA 136 135 135  K 3.2* 3.2* 3.4*  CL 95* 94* 92*  CO2 32 32 32  GLUCOSE 210* 210* 170*  BUN 38* 38* 38*  CREATININE 2.99* 2.92* 2.93*  CALCIUM 8.9 8.8* 9.0  MG 1.9  --   --    Liver Function Tests  Recent Labs  03/27/17 1214  AST 24  ALT 20  ALKPHOS 107  BILITOT 1.0  PROT 7.2  ALBUMIN 3.7  _____________  Dg Chest 2 View  Result Date: 03/27/2017 CLINICAL DATA:  Acute on chronic heart failure.  Initial encounter. EXAM: CHEST  2 VIEW COMPARISON:  Chest radiograph performed 02/13/2017 FINDINGS: The lungs are well-aerated and clear. There is no evidence of focal opacification, pleural effusion or pneumothorax. The heart is borderline normal in size. An AICD is noted at the right chest wall, with a single lead ending at the right ventricle. A left ventricular assist device is noted. A valve replacement is noted. The patient is status post median sternotomy. No acute osseous abnormalities are seen. IMPRESSION: No acute cardiopulmonary process seen. Electronically Signed   By: Trevor [name] M.D.   On: 03/27/2017 19:31   Disposition   Pt is being discharged home today in good condition.  Follow-up Plans & Appointments    Follow-up Information    Trevor [name] Follow up on 04/03/2017.   Specialty:  Cardiology Why:  10:00 AM - [facility] Contact information: [address] [id number] [place] [phone]          Discharge Medications   Current Discharge Medication List    START taking these medications   Details  metolazone (ZAROXOLYN) 2.5 MG  tablet Take 1 tablet (2.5 mg total) by mouth once a week. Qty: 10 tablet, Refills: 3    potassium chloride SA (K-DUR,KLOR-CON) 20 MEQ tablet 40 meq PO Daily with an additional 20 meq PO once weekly on Metolazone days. Qty: 65 tablet, Refills: 3      CONTINUE these medications which have CHANGED   Details  sildenafil (REVATIO) 20 MG tablet Take 2 tablets (40 mg total) by mouth 3 (three) times daily. Qty: 180 tablet, Refills: 6      CONTINUE these medications which have NOT CHANGED   Details  albuterol (PROVENTIL HFA;VENTOLIN HFA) 108 (90 Base) MCG/ACT inhaler Inhale 2 puffs into the lungs every 4 (four) hours as needed for wheezing or shortness of breath. Qty: 1 Inhaler, Refills: 5    amLODipine (NORVASC) 5 MG tablet Take 1  tablet (5 mg total) by mouth daily. Qty: 30 tablet, Refills: 6    ascorbic acid (VITAMIN C) 1000 MG tablet Take 1,000 mg by mouth daily at 12 noon.     atorvastatin (LIPITOR) 40 MG tablet Take 1 tablet (40 mg total) by mouth daily. Qty: 30 tablet, Refills: 6    busPIRone (BUSPAR) 5 MG tablet Take 1 tablet (5 mg total) by mouth 2 (two) times daily. Qty: 60 tablet, Refills: 6    DOK 100 MG capsule TAKE 2 CAPSULES(200 MG) BY MOUTH DAILY Qty: 30 capsule, Refills: 0    fluticasone (FLOVENT HFA) 110 MCG/ACT inhaler Inhale 2 puffs into the lungs 2 (two) times daily. Qty: 1 Inhaler, Refills: 5    gabapentin (NEURONTIN) 600 MG tablet Take 0.5 tablets (300 mg total) by mouth 2 (two) times daily. Qty: 60 tablet, Refills: 6    insulin glargine (LANTUS) 100 unit/mL SOPN Inject 0.2 mLs (20 Units total) into the skin at bedtime. Qty: 15 mL, Refills: 0   Associated Diagnoses: Type 2 diabetes mellitus without complication, with long-term current use of insulin (HCC)    levothyroxine (SYNTHROID, LEVOTHROID) 25 MCG tablet Take 1 tablet (25 mcg total) by mouth daily before breakfast. Qty: 30 tablet, Refills: 6    Multiple Vitamin (MULTIVITAMIN WITH MINERALS) TABS tablet  Take 1 tablet by mouth daily at 12 noon.    pantoprazole (PROTONIX) 40 MG tablet Take 1 tablet (40 mg total) by mouth daily at 12 noon. Qty: 30 tablet, Refills: 6    torsemide (DEMADEX) 20 MG tablet Take 4 tablets (80 mg total) by mouth daily. Qty: 90 tablet, Refills: 5    traMADol (ULTRAM) 50 MG tablet TAKE 1 TABLET BY MOUTH EVERY 6 HOURS AS NEEDED FOR MODERATE PAIN Qty: 30 tablet, Refills: 2    traZODone (DESYREL) 50 MG tablet TAKE 1 TABLET(50 MG) BY MOUTH AT BEDTIME AS NEEDED FOR SLEEP Qty: 30 tablet, Refills: 3    warfarin (COUMADIN) 5 MG tablet Take 1.5 tablets (7.5 mg total) by mouth daily. Qty: 45 tablet, Refills: 6    albuterol (PROVENTIL) (2.5 MG/3ML) 0.083% nebulizer solution Take 3 mLs (2.5 mg total) by nebulization every 4 (four) hours. And as needed Qty: 150 mL, Refills: 5    benzonatate (TESSALON) 100 MG capsule Take 1 capsule (100 mg total) by mouth 3 (three) times daily as needed for cough. Qty: 20 capsule, Refills: 6          Outstanding Labs/Studies   F/u INR and BMET on on 05/01/2023.  Duration of Discharge Encounter   Greater than 30 minutes including physician time.  Signed, Murray Hodgkins NP 03/29/2017, 11:48 AM

## 2017-04-01 ENCOUNTER — Telehealth (HOSPITAL_COMMUNITY): Payer: Self-pay | Admitting: Cardiac Rehabilitation

## 2017-04-01 ENCOUNTER — Telehealth (HOSPITAL_COMMUNITY): Payer: Self-pay

## 2017-04-01 ENCOUNTER — Inpatient Hospital Stay (HOSPITAL_COMMUNITY): Admission: RE | Admit: 2017-04-01 | Payer: Medicare Other | Source: Ambulatory Visit

## 2017-04-01 NOTE — Telephone Encounter (Signed)
Update: Verified Loss adjuster, chartered through [employer] primary and [employer], Deductible $183.00, pt has met $183.00 Plan follows Medicare guidelines...Marland KitchenMarland KitchenMarland Kitchen Reference # [phone] & [phone].... KJ

## 2017-04-01 NOTE — Telephone Encounter (Signed)
-----   Message from Larey Dresser, MD sent at 04/01/2017  8:53 AM EDT ----- Regarding: RE: CARDIAC REHAB  yes ----- Message ----- From: Lowell Guitar, RN Sent: 04/01/2017   7:41 AM To: Larey Dresser, MD Subject: CARDIAC REHAB                                  Dear [name],  Pt recently admitted for CHF.  Is it ok for him to begin cardiac rehab this week?  Thank you, Andi Hence, RN, BSN Cardiac Pulmonary Rehab

## 2017-04-03 ENCOUNTER — Encounter (HOSPITAL_COMMUNITY)
Admission: RE | Admit: 2017-04-03 | Discharge: 2017-04-03 | Disposition: A | Discharge status: Home / Self Care | Payer: Medicare Other | Source: Ambulatory Visit | Attending: Cardiology | Admitting: Cardiology

## 2017-04-03 ENCOUNTER — Other Ambulatory Visit (HOSPITAL_COMMUNITY): Payer: Self-pay | Admitting: Unknown Physician Specialty

## 2017-04-03 ENCOUNTER — Ambulatory Visit (HOSPITAL_COMMUNITY): Payer: Self-pay | Admitting: Pharmacist

## 2017-04-03 ENCOUNTER — Other Ambulatory Visit (HOSPITAL_COMMUNITY): Payer: Self-pay | Admitting: Cardiology

## 2017-04-03 ENCOUNTER — Ambulatory Visit (HOSPITAL_COMMUNITY)
Admission: RE | Admit: 2017-04-03 | Discharge: 2017-04-03 | Disposition: A | Discharge status: Home / Self Care | Payer: Medicare Other | Source: Ambulatory Visit | Attending: Cardiology | Admitting: Cardiology

## 2017-04-03 ENCOUNTER — Encounter (HOSPITAL_COMMUNITY): Payer: Self-pay

## 2017-04-03 VITALS — BP 78/0 | HR 70 | Ht 63.5 in | Wt 179.9 lb

## 2017-04-03 VITALS — BP 118/0 | HR 69 | Ht 65.0 in | Wt 178.2 lb

## 2017-04-03 DIAGNOSIS — J45909 Unspecified asthma, uncomplicated: Secondary | ICD-10-CM | POA: Diagnosis not present

## 2017-04-03 DIAGNOSIS — I5022 Chronic systolic (congestive) heart failure: Secondary | ICD-10-CM | POA: Diagnosis present

## 2017-04-03 DIAGNOSIS — D631 Anemia in chronic kidney disease: Secondary | ICD-10-CM | POA: Insufficient documentation

## 2017-04-03 DIAGNOSIS — I509 Heart failure, unspecified: Secondary | ICD-10-CM | POA: Diagnosis not present

## 2017-04-03 DIAGNOSIS — Z7901 Long term (current) use of anticoagulants: Secondary | ICD-10-CM | POA: Insufficient documentation

## 2017-04-03 DIAGNOSIS — Z794 Long term (current) use of insulin: Secondary | ICD-10-CM | POA: Diagnosis not present

## 2017-04-03 DIAGNOSIS — I5042 Chronic combined systolic (congestive) and diastolic (congestive) heart failure: Secondary | ICD-10-CM | POA: Diagnosis not present

## 2017-04-03 DIAGNOSIS — Z95811 Presence of heart assist device: Secondary | ICD-10-CM

## 2017-04-03 DIAGNOSIS — I130 Hypertensive heart and chronic kidney disease with heart failure and stage 1 through stage 4 chronic kidney disease, or unspecified chronic kidney disease: Secondary | ICD-10-CM | POA: Diagnosis not present

## 2017-04-03 DIAGNOSIS — Z8249 Family history of ischemic heart disease and other diseases of the circulatory system: Secondary | ICD-10-CM | POA: Diagnosis not present

## 2017-04-03 DIAGNOSIS — I2510 Atherosclerotic heart disease of native coronary artery without angina pectoris: Secondary | ICD-10-CM | POA: Insufficient documentation

## 2017-04-03 DIAGNOSIS — I482 Chronic atrial fibrillation: Secondary | ICD-10-CM | POA: Diagnosis not present

## 2017-04-03 DIAGNOSIS — E1122 Type 2 diabetes mellitus with diabetic chronic kidney disease: Secondary | ICD-10-CM | POA: Diagnosis not present

## 2017-04-03 DIAGNOSIS — I480 Paroxysmal atrial fibrillation: Secondary | ICD-10-CM | POA: Diagnosis not present

## 2017-04-03 DIAGNOSIS — N183 Chronic kidney disease, stage 3 (moderate): Secondary | ICD-10-CM | POA: Diagnosis not present

## 2017-04-03 DIAGNOSIS — I50810 Right heart failure, unspecified: Secondary | ICD-10-CM

## 2017-04-03 DIAGNOSIS — G4733 Obstructive sleep apnea (adult) (pediatric): Secondary | ICD-10-CM | POA: Diagnosis not present

## 2017-04-03 DIAGNOSIS — I255 Ischemic cardiomyopathy: Secondary | ICD-10-CM | POA: Insufficient documentation

## 2017-04-03 DIAGNOSIS — Z951 Presence of aortocoronary bypass graft: Secondary | ICD-10-CM | POA: Insufficient documentation

## 2017-04-03 DIAGNOSIS — D638 Anemia in other chronic diseases classified elsewhere: Secondary | ICD-10-CM | POA: Insufficient documentation

## 2017-04-03 DIAGNOSIS — Z79899 Other long term (current) drug therapy: Secondary | ICD-10-CM | POA: Diagnosis not present

## 2017-04-03 LAB — CBC
HEMATOCRIT: 27.7 % — AB (ref 39.0–52.0)
HEMOGLOBIN: 9.1 g/dL — AB (ref 13.0–17.0)
MCH: 31.2 pg (ref 26.0–34.0)
MCHC: 32.9 g/dL (ref 30.0–36.0)
MCV: 94.9 fL (ref 78.0–100.0)
PLATELETS: 125 10*3/uL — AB (ref 150–400)
RBC: 2.92 MIL/uL — AB (ref 4.22–5.81)
RDW: 16.7 % — ABNORMAL HIGH (ref 11.5–15.5)
WBC: 8.8 10*3/uL (ref 4.0–10.5)

## 2017-04-03 LAB — BASIC METABOLIC PANEL
Anion gap: 8 (ref 5–15)
BUN: 49 mg/dL — ABNORMAL HIGH (ref 6–20)
CHLORIDE: 92 mmol/L — AB (ref 101–111)
CO2: 32 mmol/L (ref 22–32)
CREATININE: 3.43 mg/dL — AB (ref 0.61–1.24)
Calcium: 9.1 mg/dL (ref 8.9–10.3)
GFR calc non Af Amer: 17 mL/min — ABNORMAL LOW (ref >60)
GFR, EST AFRICAN AMERICAN: 19 mL/min — AB (ref >60)
Glucose, Bld: 225 mg/dL — ABNORMAL HIGH (ref 65–99)
POTASSIUM: 3.9 mmol/L (ref 3.5–5.1)
SODIUM: 132 mmol/L — AB (ref 135–145)

## 2017-04-03 LAB — PROTIME-INR
INR: 1.87
Prothrombin Time: 21.8 seconds — ABNORMAL HIGH (ref 11.4–15.2)

## 2017-04-03 LAB — LACTATE DEHYDROGENASE: LDH: 260 U/L — AB (ref 98–192)

## 2017-04-03 MED ORDER — DOCUSATE SODIUM 100 MG PO CAPS: 100.0000 mg | ORAL_CAPSULE | Freq: Two times a day (BID) | ORAL | 12 refills | Status: DC

## 2017-04-03 NOTE — Progress Notes (Signed)
Cardiac Individual Treatment Plan  Patient Details  Name: Trevor [name] MRN: [id number] Date of Birth: Jun 14, 1946 Referring Provider:     CARDIAC REHAB PHASE II ORIENTATION from 04/03/2017 in [facility]  Referring Provider  Loralie Champagne MD      Initial Encounter Date:    CARDIAC REHAB PHASE II ORIENTATION from 04/03/2017 in [facility]  Date  04/03/17  Referring Provider  Loralie Champagne MD      Visit Diagnosis: 12/13/16 LVAD (left ventricular assist device) present ([facility])  Heart failure, chronic systolic ([place])  Patient's Home Medications on Admission:  Current Outpatient Prescriptions:  .  albuterol (PROVENTIL HFA;VENTOLIN HFA) 108 (90 Base) MCG/ACT inhaler, Inhale 2 puffs into the lungs every 4 (four) hours as needed for wheezing or shortness of breath., Disp: 1 Inhaler, Rfl: 5 .  albuterol (PROVENTIL) (2.5 MG/3ML) 0.083% nebulizer solution, Take 3 mLs (2.5 mg total) by nebulization every 4 (four) hours. And as needed, Disp: 150 mL, Rfl: 5 .  amLODipine (NORVASC) 5 MG tablet, Take 1 tablet (5 mg total) by mouth daily., Disp: 30 tablet, Rfl: 6 .  ascorbic acid (VITAMIN C) 1000 MG tablet, Take 1,000 mg by mouth daily at 12 noon. , Disp: , Rfl:  .  atorvastatin (LIPITOR) 40 MG tablet, Take 1 tablet (40 mg total) by mouth daily., Disp: 30 tablet, Rfl: 6 .  busPIRone (BUSPAR) 5 MG tablet, Take 1 tablet (5 mg total) by mouth 2 (two) times daily. (Patient taking differently: Take 5 mg by mouth 2 (two) times daily. Morning & bedtime), Disp: 60 tablet, Rfl: 6 .  fluticasone (FLOVENT HFA) 110 MCG/ACT inhaler, Inhale 2 puffs into the lungs 2 (two) times daily. (Patient taking differently: Inhale 2 puffs into the lungs 2 (two) times daily. MORNING & BEDTIME), Disp: 1 Inhaler, Rfl: 5 .  gabapentin (NEURONTIN) 600 MG tablet, Take 0.5 tablets (300 mg total) by mouth 2 (two) times daily., Disp: 60 tablet, Rfl: 6 .  insulin glargine  (LANTUS) 100 unit/mL SOPN, Inject 0.2 mLs (20 Units total) into the skin at bedtime., Disp: 15 mL, Rfl: 0 .  levothyroxine (SYNTHROID, LEVOTHROID) 25 MCG tablet, Take 1 tablet (25 mcg total) by mouth daily before breakfast., Disp: 30 tablet, Rfl: 6 .  metolazone (ZAROXOLYN) 2.5 MG tablet, Take 1 tablet (2.5 mg total) by mouth once a week., Disp: 10 tablet, Rfl: 3 .  Multiple Vitamin (MULTIVITAMIN WITH MINERALS) TABS tablet, Take 1 tablet by mouth daily at 12 noon., Disp: , Rfl:  .  pantoprazole (PROTONIX) 40 MG tablet, Take 1 tablet (40 mg total) by mouth daily at 12 noon., Disp: 30 tablet, Rfl: 6 .  potassium chloride SA (K-DUR,KLOR-CON) 20 MEQ tablet, TAKE 2 TABLETS BY MOUTH DAILY WITH AN ADDITIONAL 1 TABLET ONCE WEEKLY ON METOLAZONE DAYS, Disp: 182 tablet, Rfl: 3 .  sildenafil (REVATIO) 20 MG tablet, Take 2 tablets (40 mg total) by mouth 3 (three) times daily., Disp: 180 tablet, Rfl: 6 .  torsemide (DEMADEX) 20 MG tablet, Take 4 tablets (80 mg total) by mouth daily., Disp: 90 tablet, Rfl: 5 .  traMADol (ULTRAM) 50 MG tablet, TAKE 1 TABLET BY MOUTH EVERY 6 HOURS AS NEEDED FOR MODERATE PAIN, Disp: 30 tablet, Rfl: 2 .  traZODone (DESYREL) 50 MG tablet, TAKE 1 TABLET(50 MG) BY MOUTH AT BEDTIME AS NEEDED FOR SLEEP, Disp: 30 tablet, Rfl: 3 .  warfarin (COUMADIN) 5 MG tablet, Take 1.5 tablets (7.5 mg total) by mouth  daily., Disp: 45 tablet, Rfl: 6 .  benzonatate (TESSALON) 100 MG capsule, Take 1 capsule (100 mg total) by mouth 3 (three) times daily as needed for cough., Disp: 20 capsule, Rfl: 6 .  docusate sodium (COLACE) 100 MG capsule, Take 1 capsule (100 mg total) by mouth 2 (two) times daily., Disp: 60 capsule, Rfl: 12  Past Medical History: Past Medical History:  Diagnosis Date  . AICD (automatic cardioverter/defibrillator) present   . Asthma   . CHF (congestive heart failure) ([place])   . Diabetes ([place])   . Kidney disease   . Presence of permanent cardiac pacemaker    AICD  . Sleep apnea      Tobacco Use: History  Smoking Status  . Never Smoker  Smokeless Tobacco  . Never Used    Labs: Recent Review Flowsheet Data    Labs for ITP Cardiac and Pulmonary Rehab Latest Ref Rng & Units 12/31/2016 01/01/2017 03/17/2017 03/27/2017 03/27/2017   Cholestrol 0 - 200 mg/dL - - - - -   LDLCALC 0 - 99 mg/dL - - - - -   HDL >40 mg/dL - - - - -   Trlycerides <150 mg/dL - - - - -   Hemoglobin A1c 4.8 - 5.6 % - - 6.7(H) - -   PHART 7.350 - 7.450 - - - - -   PCO2ART 32.0 - 48.0 mmHg - - - - -   HCO3 20.0 - 28.0 mmol/L - - - 34.4(H) 35.2(H)   TCO2 0 - 100 mmol/L - - - 36 37   O2SAT % 60.9 59.0 - 53.0 53.0      Capillary Blood Glucose: Lab Results  Component Value Date   GLUCAP 170 (H) 03/29/2017   GLUCAP 132 (H) 03/29/2017   GLUCAP 240 (H) 03/28/2017   GLUCAP 244 (H) 03/28/2017   GLUCAP 246 (H) 03/28/2017     Exercise Target Goals: Date: 04/03/17  Exercise Program Goal: Individual exercise prescription set with THRR, safety & activity barriers. Participant demonstrates ability to understand and report RPE using BORG scale, to self-measure pulse accurately, and to acknowledge the importance of the exercise prescription.  Exercise Prescription Goal: Starting with aerobic activity 30 plus minutes a day, 3 days per week for initial exercise prescription. Provide home exercise prescription and guidelines that participant acknowledges understanding prior to discharge.  Activity Barriers & Risk Stratification:     Activity Barriers & Cardiac Risk Stratification - 04/03/17 0832      Activity Barriers & Cardiac Risk Stratification   Activity Barriers Arthritis;Deconditioning;Muscular Weakness;Shortness of Breath   Cardiac Risk Stratification High      6 Minute Walk:     6 Minute Walk    Row Name 04/03/17 1611 04/03/17 1618       6 Minute Walk   Phase Initial  -    Distance 830 feet  -    Walk Time 4.55 minutes  -    # of Rest Breaks 0 2    MPH 2.1  -    METS 1.7  -     RPE 13  -    VO2 Peak 5.9  -    Symptoms No  -    Resting HR 70 bpm  -    Resting BP 78/0  -    Max Ex. HR 125 bpm  -    Max Ex. BP 96/0  -    2 Minute Post BP 84/0  -       Oxygen  Initial Assessment:   Oxygen Re-Evaluation:   Oxygen Discharge (Final Oxygen Re-Evaluation):   Initial Exercise Prescription:     Initial Exercise Prescription - 04/03/17 1100      Date of Initial Exercise RX and Referring Provider   Date 04/03/17   Referring Provider Loralie Champagne MD     NuStep   Level 2   Minutes 10   METs 2     Arm Ergometer   Level 1   Watts 15   Minutes 10   METs 2     Track   Laps 8   Minutes 10   METs 2.4     Prescription Details   Frequency (times per week) 3   Duration Progress to 45 minutes of aerobic exercise without signs/symptoms of physical distress     Intensity   THRR 40-80% of Max Heartrate 60-119   Ratings of Perceived Exertion 11-13   Perceived Dyspnea 0-4     Progression   Progression Continue to progress workloads to maintain intensity without signs/symptoms of physical distress.     Resistance Training   Training Prescription Yes   Weight 2   Reps 10-15      Perform Capillary Blood Glucose checks as needed.  Exercise Prescription Changes:   Exercise Comments:   Exercise Goals and Review:      Exercise Goals    Row Name 04/03/17 [phone]             Exercise Goals   Increase Physical Activity Yes       Intervention Provide advice, education, support and counseling about physical activity/exercise needs.;Develop an individualized exercise prescription for aerobic and resistive training based on initial evaluation findings, risk stratification, comorbidities and participant's personal goals.       Expected Outcomes Achievement of increased cardiorespiratory fitness and enhanced flexibility, muscular endurance and strength shown through measurements of functional capacity and personal statement of participant.        Increase Strength and Stamina Yes  be able to climb stairs and uphill without SOB       Intervention Provide advice, education, support and counseling about physical activity/exercise needs.;Develop an individualized exercise prescription for aerobic and resistive training based on initial evaluation findings, risk stratification, comorbidities and participant's personal goals.       Expected Outcomes Achievement of increased cardiorespiratory fitness and enhanced flexibility, muscular endurance and strength shown through measurements of functional capacity and personal statement of participant.          Exercise Goals Re-Evaluation :    Discharge Exercise Prescription (Final Exercise Prescription Changes):   Nutrition:  Target Goals: Understanding of nutrition guidelines, daily intake of sodium 1500mg , cholesterol 200mg , calories 30% from fat and 7% or less from saturated fats, daily to have 5 or more servings of fruits and vegetables.  Biometrics:     Pre Biometrics - 04/03/17 1608      Pre Biometrics   Height 5' 3.5" (1.613 m)   Weight 179 lb 14.3 oz (81.6 kg)   Waist Circumference 40.75 inches   Hip Circumference 41.5 inches   Waist to Hip Ratio 0.98 %   BMI (Calculated) 31.4   Triceps Skinfold 20 mm   % Body Fat 31.2 %   Grip Strength 33.5 kg   Flexibility 13 in   Single Leg Stand 2.2 seconds       Nutrition Therapy Plan and Nutrition Goals:   Nutrition Discharge: Nutrition Scores:   Nutrition Goals Re-Evaluation:   Nutrition Goals Re-Evaluation:  Nutrition Goals Discharge (Final Nutrition Goals Re-Evaluation):   Psychosocial: Target Goals: Acknowledge presence or absence of significant depression and/or stress, maximize coping skills, provide positive support system. Participant is able to verbalize types and ability to use techniques and skills needed for reducing stress and depression.  Initial Review & Psychosocial Screening:     Initial Psych  Review & Screening - 04/03/17 1605      Initial Review   Current issues with None Identified     Family Dynamics   Good Support System? Yes     Barriers   Psychosocial barriers to participate in program The patient should benefit from training in stress management and relaxation.;There are no identifiable barriers or psychosocial needs.     Screening Interventions   Interventions Encouraged to exercise      Quality of Life Scores:     Quality of Life - 04/03/17 1145      Quality of Life Scores   Health/Function Pre 19 %   Socioeconomic Pre 23.17 %   Psych/Spiritual Pre 16.29 %   Family Pre 20.83 %   GLOBAL Pre 19.37 %      PHQ-9: Recent Review Flowsheet Data    Depression screen [facility] 2/9 01/17/2017   Decreased Interest 0   Down, Depressed, Hopeless 0   PHQ - 2 Score 0     Interpretation of Total Score  Total Score Depression Severity:  1-4 = Minimal depression, 5-9 = Mild depression, 10-14 = Moderate depression, 15-19 = Moderately severe depression, 20-27 = Severe depression   Psychosocial Evaluation and Intervention:   Psychosocial Re-Evaluation:   Psychosocial Discharge (Final Psychosocial Re-Evaluation):   Vocational Rehabilitation: Provide vocational rehab assistance to qualifying candidates.   Vocational Rehab Evaluation & Intervention:     Vocational Rehab - 04/03/17 1602      Initial Vocational Rehab Evaluation & Intervention   Assessment shows need for Vocational Rehabilitation No  Mr Dorko is retired and does not need vocational rehab at this time.      Education: Education Goals: Education classes will be provided on a weekly basis, covering required topics. Participant will state understanding/return demonstration of topics presented.  Learning Barriers/Preferences:     Learning Barriers/Preferences - 04/03/17 4403      Learning Barriers/Preferences   Learning Barriers Sight   Learning Preferences Skilled Demonstration;Verbal  Instruction;Video;Written Material      Education Topics: Count Your Pulse:  -Group instruction provided by verbal instruction, demonstration, patient participation and written materials to support subject.  Instructors address importance of being able to find your pulse and how to count your pulse when at home without a heart monitor.  Patients get hands on experience counting their pulse with staff help and individually.   Heart Attack, Angina, and Risk Factor Modification:  -Group instruction provided by verbal instruction, video, and written materials to support subject.  Instructors address signs and symptoms of angina and heart attacks.    Also discuss risk factors for heart disease and how to make changes to improve heart health risk factors.   Functional Fitness:  -Group instruction provided by verbal instruction, demonstration, patient participation, and written materials to support subject.  Instructors address safety measures for doing things around the house.  Discuss how to get up and down off the floor, how to pick things up properly, how to safely get out of a chair without assistance, and balance training.   Meditation and Mindfulness:  -Group instruction provided by verbal instruction, patient participation, and written materials  to support subject.  Instructor addresses importance of mindfulness and meditation practice to help reduce stress and improve awareness.  Instructor also leads participants through a meditation exercise.    Stretching for Flexibility and Mobility:  -Group instruction provided by verbal instruction, patient participation, and written materials to support subject.  Instructors lead participants through series of stretches that are designed to increase flexibility thus improving mobility.  These stretches are additional exercise for major muscle groups that are typically performed during regular warm up and cool down.   Hands Only CPR:  -Group verbal,  video, and participation provides a basic overview of AHA guidelines for community CPR. Role-play of emergencies allow participants the opportunity to practice calling for help and chest compression technique with discussion of AED use.   Hypertension: -Group verbal and written instruction that provides a basic overview of hypertension including the most recent diagnostic guidelines, risk factor reduction with self-care instructions and medication management.    Nutrition I class: Heart Healthy Eating:  -Group instruction provided by PowerPoint slides, verbal discussion, and written materials to support subject matter. The instructor gives an explanation and review of the Therapeutic Lifestyle Changes diet recommendations, which includes a discussion on lipid goals, dietary fat, sodium, fiber, plant stanol/sterol esters, sugar, and the components of a well-balanced, healthy diet.   Nutrition II class: Lifestyle Skills:  -Group instruction provided by PowerPoint slides, verbal discussion, and written materials to support subject matter. The instructor gives an explanation and review of label reading, grocery shopping for heart health, heart healthy recipe modifications, and ways to make healthier choices when eating out.   Diabetes Question & Answer:  -Group instruction provided by PowerPoint slides, verbal discussion, and written materials to support subject matter. The instructor gives an explanation and review of diabetes co-morbidities, pre- and post-prandial blood glucose goals, pre-exercise blood glucose goals, signs, symptoms, and treatment of hypoglycemia and hyperglycemia, and foot care basics.   Diabetes Blitz:  -Group instruction provided by PowerPoint slides, verbal discussion, and written materials to support subject matter. The instructor gives an explanation and review of the physiology behind type 1 and type 2 diabetes, diabetes medications and rational behind using different  medications, pre- and post-prandial blood glucose recommendations and Hemoglobin A1c goals, diabetes diet, and exercise including blood glucose guidelines for exercising safely.    Portion Distortion:  -Group instruction provided by PowerPoint slides, verbal discussion, written materials, and food models to support subject matter. The instructor gives an explanation of serving size versus portion size, changes in portions sizes over the last 20 years, and what consists of a serving from each food group.   Stress Management:  -Group instruction provided by verbal instruction, video, and written materials to support subject matter.  Instructors review role of stress in heart disease and how to cope with stress positively.     Exercising on Your Own:  -Group instruction provided by verbal instruction, power point, and written materials to support subject.  Instructors discuss benefits of exercise, components of exercise, frequency and intensity of exercise, and end points for exercise.  Also discuss use of nitroglycerin and activating EMS.  Review options of places to exercise outside of rehab.  Review guidelines for sex with heart disease.   Cardiac Drugs I:  -Group instruction provided by verbal instruction and written materials to support subject.  Instructor reviews cardiac drug classes: antiplatelets, anticoagulants, beta blockers, and statins.  Instructor discusses reasons, side effects, and lifestyle considerations for each drug class.   Cardiac Drugs  II:  -Group instruction provided by verbal instruction and written materials to support subject.  Instructor reviews cardiac drug classes: angiotensin converting enzyme inhibitors (ACE-I), angiotensin II receptor blockers (ARBs), nitrates, and calcium channel blockers.  Instructor discusses reasons, side effects, and lifestyle considerations for each drug class.   Anatomy and Physiology of the Circulatory System:  Group verbal and written  instruction and models provide basic cardiac anatomy and physiology, with the coronary electrical and arterial systems. Review of: AMI, Angina, Valve disease, Heart Failure, Peripheral Artery Disease, Cardiac Arrhythmia, Pacemakers, and the ICD.   Other Education:  -Group or individual verbal, written, or video instructions that support the educational goals of the cardiac rehab program.   Knowledge Questionnaire Score:     Knowledge Questionnaire Score - 04/03/17 1138      Knowledge Questionnaire Score   Pre Score 21/24      Core Components/Risk Factors/Patient Goals at Admission:     Personal Goals and Risk Factors at Admission - 04/03/17 1600      Core Components/Risk Factors/Patient Goals on Admission    Weight Management Obesity;Yes;Weight Loss;Weight Maintenance   Intervention Weight Management: Develop a combined nutrition and exercise program designed to reach desired caloric intake, while maintaining appropriate intake of nutrient and fiber, sodium and fats, and appropriate energy expenditure required for the weight goal.;Weight Management: Provide education and appropriate resources to help participant work on and attain dietary goals.;Weight Management/Obesity: Establish reasonable short term and long term weight goals.;Obesity: Provide education and appropriate resources to help participant work on and attain dietary goals.   Admit Weight 179 lb 14.3 oz (81.6 kg)   Goal Weight: Short Term 169 lb (76.7 kg)   Goal Weight: Long Term 164 lb (74.4 kg)   Improve shortness of breath with ADL's Yes   Intervention Provide education, individualized exercise plan and daily activity instruction to help decrease symptoms of SOB with activities of daily living.   Expected Outcomes Short Term: Achieves a reduction of symptoms when performing activities of daily living.   Diabetes Yes   Intervention Provide education about signs/symptoms and action to take for hypo/hyperglycemia.;Provide  education about proper nutrition, including hydration, and aerobic/resistive exercise prescription along with prescribed medications to achieve blood glucose in normal ranges: Fasting glucose 65-99 mg/dL   Expected Outcomes Short Term: Participant verbalizes understanding of the signs/symptoms and immediate care of hyper/hypoglycemia, proper foot care and importance of medication, aerobic/resistive exercise and nutrition plan for blood glucose control.;Long Term: Attainment of HbA1C < 7%.   Heart Failure Yes   Intervention Provide a combined exercise and nutrition program that is supplemented with education, support and counseling about heart failure. Directed toward relieving symptoms such as shortness of breath, decreased exercise tolerance, and extremity edema.   Expected Outcomes Improve functional capacity of life;Short term: Attendance in program 2-3 days a week with increased exercise capacity. Reported lower sodium intake. Reported increased fruit and vegetable intake. Reports medication compliance.;Short term: Daily weights obtained and reported for increase. Utilizing diuretic protocols set by physician.;Long term: Adoption of self-care skills and reduction of barriers for early signs and symptoms recognition and intervention leading to self-care maintenance.   Hypertension Yes   Intervention Provide education on lifestyle modifcations including regular physical activity/exercise, weight management, moderate sodium restriction and increased consumption of fresh fruit, vegetables, and low fat dairy, alcohol moderation, and smoking cessation.;Monitor prescription use compliance.   Expected Outcomes Short Term: Continued assessment and intervention until BP is < 140/71mm HG in hypertensive participants. < 130/29mm  HG in hypertensive participants with diabetes, heart failure or chronic kidney disease.;Long Term: Maintenance of blood pressure at goal levels.   Lipids Yes   Intervention Provide  education and support for participant on nutrition & aerobic/resistive exercise along with prescribed medications to achieve LDL 70mg , HDL >40mg .   Expected Outcomes Short Term: Participant states understanding of desired cholesterol values and is compliant with medications prescribed. Participant is following exercise prescription and nutrition guidelines.;Long Term: Cholesterol controlled with medications as prescribed, with individualized exercise RX and with personalized nutrition plan. Value goals: LDL < 70mg , HDL > 40 mg.   Stress Yes   Intervention Offer individual and/or small group education and counseling on adjustment to heart disease, stress management and health-related lifestyle change. Teach and support self-help strategies.;Refer participants experiencing significant psychosocial distress to appropriate mental health specialists for further evaluation and treatment. When possible, include family members and significant others in education/counseling sessions.   Expected Outcomes Short Term: Participant demonstrates changes in health-related behavior, relaxation and other stress management skills, ability to obtain effective social support, and compliance with psychotropic medications if prescribed.;Long Term: Emotional wellbeing is indicated by absence of clinically significant psychosocial distress or social isolation.      Core Components/Risk Factors/Patient Goals Review:    Core Components/Risk Factors/Patient Goals at Discharge (Final Review):    ITP Comments:     ITP Comments    Row Name 04/03/17 0750           ITP Comments Medical Director, Dr. Fransico Him          Comments: [name] attended orientation from 0800 to 1000 to review rules and guidelines for program. Completed 6 minute walk test, Intitial ITP, and exercise prescription.  VSS. Telemetry-Chronic Atrial fibrillation with POD and intermittent PVC's this has been previously documented. [name] stopped to rest  times 2 due to fatigue and completed his walk test without further difficulty. [name] had his battery pack with him.Barnet Pall, RN,BSN 04/03/2017 4:19 PM

## 2017-04-03 NOTE — Progress Notes (Signed)
Cardiac Rehab Medication Review by a Pharmacist  Does the patient  feel that his/her medications are working for him/her?  yes  Has the patient been experiencing any side effects to the medications prescribed?  no  Does the patient measure his/her own blood pressure or blood glucose at home?  yes - checks blood sugar daily  Does the patient have any problems obtaining medications due to transportation or finances?   Yes - pt has had trouble affording sildenafil  Understanding of regimen: good Understanding of indications: good Potential of compliance: excellent    Pharmacist comments: Pt presents for initial cardiac rehab appointment after recent LVAD implant. Pt endorses no active issues at this time other than difficulty affording sildenafil after recent dose increase. Pt plans to discuss with HF team at appointment today. No other issues noted.  Arrie Senate, PharmD PGY-1 Pharmacy Resident Pager: [phone] 04/03/2017

## 2017-04-03 NOTE — Progress Notes (Addendum)
Patient presents for 1 week hospital follow up in [facility] today. Reports no problems with VAD equipment or concerns with drive line.   Vital Signs:  Doppler Pressure 80   Automatc BP: 102/65 (78) HR:69 SPO2:99  %  Weight: 178.2 lb w/o eqt D/c weight: 179 lb Home weights: 180 lbs   VAD Indication: DT- age excluding    VAD interrogation & Equipment Management: Speed:9200 Flow: 5.4 Power:5.5 w    PI:6.4  Alarms: no clinical alarms Events: rare  Fixed speed 9200 Low speed limit: 8600  Exit Site Care: Drive line is being maintained every other day  by [name]. Drive line exit site well healed and incorporated. The velour is fully implanted at exit site. Dressing dry and intact. Large scab noted at site, removed easily with saline. Stabilization device present and accurately applied. Pt denies fever or chills. Pt given 8 daily dressing kits today.    Significant Events on VAD Support:  02/2017> GIB- AVM clipped x2  Device:Medtronic Therapies: on Last check: 02/2017  BP & Labs:  MAP 84 - Doppler is reflecting MAP  Hgb 9.1 - No S/S of bleeding. Specifically denies melena/BRBPR or nosebleeds.  LDH stable at 260 with established baseline of 225- 300. Denies tea-colored urine. No power elevations noted on interrogation.    Plan- 1. Will schedule next Octreotide injection. 2. Continue every 3 days dressing change, may advance to weekly at next visit if tissues ingrowth is improved.  3. Decrease Demadex to 60 daily. 4. Return for INR and BMET in 1 week.   5. Return to clinic in 1 month.   [name] VAD Coordinator   Office: [phone] 24/7 Emergency VAD Pager: [phone]     Trevor Yeagle Murphyis a 71 y.o.malewith a history of CAD s/p CABG x 4 2010, OSA, AS with TAVR 2015, CKD, DM2, paroxysmal atrial fibrillation, asthma, and ischemic cardiomyopathy with Medtronic ICD.   Relocated to [place] from [place] in early 2018. Over the last  couple of years, he had been admitted multiple times for CHF. Last admission in [place] was in 1/18. Sounds like the hospitalization was complicated by cardiorenal syndrome and there was consideration of doing dialysis.  He was admitted in to [facility] 11/27/16 with NYHA class IV symptoms and hypotension. He was placed on dual inotropes to facilitate diuresis, cardiac output and renal function.  CT surgery consulted for mechanical support and he was deemed appropriate for LVAD work up. He completed LVAD work up and was approved for HMII LVAD --> DT. On 12/11/16, he had IABP placed to optimize cardiac output and improve renal function. He then underwent HMII LVAD placement + tricuspid valve repair on 12/14/2015. Post operatively pressors weaned off slowly. He required significant diuresis.  Course was complicated by RV dysfunction and renal dysfunction.  Patient was admitted with lower GI bleeding in 4/18.  He had been on ASA 325 (increased with mild LDH elevation) and warfarin INR 2-2.5.  He had 4 units PRBCs total.  Colonoscopy showed colonic AVM treated with APC and clipping.  He was sent home off ASA and on warfarin INR goal 2-2.5.   Recently, creatinine was noted to increase as high as 3.67.  I cut back on his diuretics but he became volume overloaded.  I increased diuretics again with poor response.  In 5/18, he had RHC showing elevated right and left heart filling pressures and preserved cardiac output. I admitted him for IV diuresis.  Weight and creatinine  came down, creatinine was 2.9 when discharged.  Ramp echo was done and speed was increased to 9200 rpm.  Sildenafil was increased to 40 mg tid.    Patient returns for followup today. Weight is down 13 lbs compared to prior office visit.  He is doing cardiac rehab.  No dyspnea walking on flat ground. No orthopnea/PND.  He walks some in the park for exercise.  Mild dyspnea going up a hill. No lightheadedness.   Labs (3/18): LDH 331 => 401 => 443 => 366 =>  313, K 3.6 => 3.2 => 3.5, creatinine 1.98 => 1.74 => 1.8 => 1.79, hgb 8.3 => 8.9 => 8.7 => 8.4, INR 2.11 => 2.4 Labs (4/18): hgb 9.4 Labs (5/18): LDH 269 => 249, INR 2.57, K 4.5, creatinine 1.92 => 2.71 => 3.67 => 3.43 => 3.1 => 2.93, hgb 9, plts 104  PMH: 1. CAD: s/p CABG in 2010.  2. Aortic stenosis: s/p TAVR in 2015. Valve looked ok on 2/18 echo.  3. Atrial fibrillation: Chronic.  4. Type II diabetes.  5. CKD: Stage III, cardiorenal syndrome.  6. Chronic systolic CHF: Ischemic cardiomyopathy with prominent RV dysfunction.  Medtronic ICD.  - Echo (2/18): EF 20-25%, moderately dilated LV, moderately dilated/moderately dysfunctional RV, severe TR.  - Cardiogenic shock 2/18 requiring milrinone, norepinephrine, and IABP.  - Heartmate II LVAD for DT placed 12/13/16.   - RHC (5/18): mean RA 16, PA 55/22 mean 33, mean PCWP 23, CI 2.59, PVR 3.87 7. OSA.  8. Thrombocytopenia: Post-op LVAD, resolved.  9. Tricuspid regurgitation: Severe, s/p repair with LVAD placement in 2/18.  10. Lower GI bleed (4/18): Colonic AVM, treated with APC and clipping.   Social History   Social History  . Marital status: Widowed    Spouse name: N/A  . Number of children: 0  . Years of education: 14   Occupational History  . Not on file.   Social History Main Topics  . Smoking status: Never Smoker  . Smokeless tobacco: Never Used  . Alcohol use No  . Drug use: No  . Sexual activity: Not Currently   Other Topics Concern  . Not on file   Social History Narrative   Patient is a widow. Moved to [place] from [place], [place]. Currently lives with his sister Danny Yackley.    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    Family History  Problem Relation Age of Onset  . Heart failure Father   . Heart attack Father   . Healthy Mother   . Diabetes Paternal Grandfather    ROS: All systems reviewed and negative except as per HPI.   Current Outpatient Prescriptions  Medication Sig Dispense Refill   . albuterol (PROVENTIL HFA;VENTOLIN HFA) 108 (90 Base) MCG/ACT inhaler Inhale 2 puffs into the lungs every 4 (four) hours as needed for wheezing or shortness of breath. 1 Inhaler 5  . albuterol (PROVENTIL) (2.5 MG/3ML) 0.083% nebulizer solution Take 3 mLs (2.5 mg total) by nebulization every 4 (four) hours. And as needed 150 mL 5  . amLODipine (NORVASC) 5 MG tablet Take 1 tablet (5 mg total) by mouth daily. 30 tablet 6  . ascorbic acid (VITAMIN C) 1000 MG tablet Take 1,000 mg by mouth daily at 12 noon.     Marland Kitchen atorvastatin (LIPITOR) 40 MG tablet Take 1 tablet (40 mg total) by mouth daily. 30 tablet 6  . benzonatate (TESSALON) 100 MG capsule Take 1 capsule (100 mg total) by mouth 3 (three)  times daily as needed for cough. 20 capsule 6  . busPIRone (BUSPAR) 5 MG tablet Take 1 tablet (5 mg total) by mouth 2 (two) times daily. (Patient taking differently: Take 5 mg by mouth 2 (two) times daily. Morning & bedtime) 60 tablet 6  . docusate sodium (COLACE) 100 MG capsule Take 1 capsule (100 mg total) by mouth 2 (two) times daily. 60 capsule 12  . fluticasone (FLOVENT HFA) 110 MCG/ACT inhaler Inhale 2 puffs into the lungs 2 (two) times daily. (Patient taking differently: Inhale 2 puffs into the lungs 2 (two) times daily. MORNING & BEDTIME) 1 Inhaler 5  . gabapentin (NEURONTIN) 600 MG tablet Take 0.5 tablets (300 mg total) by mouth 2 (two) times daily. 60 tablet 6  . insulin glargine (LANTUS) 100 unit/mL SOPN Inject 0.2 mLs (20 Units total) into the skin at bedtime. 15 mL 0  . levothyroxine (SYNTHROID, LEVOTHROID) 25 MCG tablet Take 1 tablet (25 mcg total) by mouth daily before breakfast. 30 tablet 6  . metolazone (ZAROXOLYN) 2.5 MG tablet Take 1 tablet (2.5 mg total) by mouth once a week. 10 tablet 3  . Multiple Vitamin (MULTIVITAMIN WITH MINERALS) TABS tablet Take 1 tablet by mouth daily at 12 noon.    . pantoprazole (PROTONIX) 40 MG tablet Take 1 tablet (40 mg total) by mouth daily at 12 noon. 30 tablet 6  .  potassium chloride SA (K-DUR,KLOR-CON) 20 MEQ tablet TAKE 2 TABLETS BY MOUTH DAILY WITH AN ADDITIONAL 1 TABLET ONCE WEEKLY ON METOLAZONE DAYS 182 tablet 3  . sildenafil (REVATIO) 20 MG tablet Take 2 tablets (40 mg total) by mouth 3 (three) times daily. 180 tablet 6  . torsemide (DEMADEX) 20 MG tablet Take 4 tablets (80 mg total) by mouth daily. 90 tablet 5  . traMADol (ULTRAM) 50 MG tablet TAKE 1 TABLET BY MOUTH EVERY 6 HOURS AS NEEDED FOR MODERATE PAIN 30 tablet 2  . traZODone (DESYREL) 50 MG tablet TAKE 1 TABLET(50 MG) BY MOUTH AT BEDTIME AS NEEDED FOR SLEEP 30 tablet 3  . warfarin (COUMADIN) 5 MG tablet Take 1.5 tablets (7.5 mg total) by mouth daily. 45 tablet 6   No current facility-administered medications for this encounter.    BP (!) 118/0   Pulse 69   Ht 5\' 5"  (1.651 m)   Wt 178 lb 3.2 oz (80.8 kg)   SpO2 99%   BMI 29.65 kg/m   MAP 82 General: NAD Neck: JVP 8 cm, no thyromegaly or thyroid nodule.  Lungs: CTAB  CV: LVAD hum. Trace ankle edema.  No carotid bruit.   Abdomen: Soft, nontender, no hepatosplenomegaly, no distention.  Skin: Intact without lesions or rashes.  Neurologic: Alert and oriented x 3.  Psych: Normal affect. Extremities: No clubbing or cyanosis.  HEENT: Normal.   Assessment/Plan: 1. Chronic systolic CHF: Ischemic cardiomyopathy, EF 20-25% with RV dysfunction on 2/18 echo pre-LVAD.  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  Medtronic ICD.  He has felt much better post-LVAD, NYHA class II.  However, recently developed AKI, eventually admitted in 5/18 after RHC showed elevated filling pressures.  He was diuresed and creatinine came down. Volume status now looks good.     - Continue torsemide 80 mg daily with once weekly metolazone.   - Continue warfarin, no ASA with recent GI bleeding.  - He has started cardiac rehab.     2. CAD: s/p CABG.  No chest pain.  He is on atorvastatin.  3. CKD stage III:  Uncertain of cause  for AKI in 5/18.  May be a result of GI bleed with  hypotension.  Creatinine has come down, most recently 2.9.  BMET today.   4. Atrial fibrillation: Chronic.  On warfarin.  5. RV failure: He is on Revatio, now increased to 40 mg tid.   6. HTN: Controlled.    7. Anemia: Stable, baseline anemia of renal disease/chronic disease but recent bleeding from colonic AVMs. CBC today.  8. 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  - Continue octreotide injections.   Loralie Champagne 04/05/2017

## 2017-04-07 ENCOUNTER — Encounter (HOSPITAL_COMMUNITY)
Admission: RE | Admit: 2017-04-07 | Discharge: 2017-04-07 | Disposition: A | Discharge status: Home / Self Care | Payer: Medicare Other | Source: Ambulatory Visit | Attending: Cardiology | Admitting: Cardiology

## 2017-04-07 DIAGNOSIS — I5022 Chronic systolic (congestive) heart failure: Secondary | ICD-10-CM

## 2017-04-07 DIAGNOSIS — Z95811 Presence of heart assist device: Secondary | ICD-10-CM | POA: Diagnosis not present

## 2017-04-07 LAB — GLUCOSE, CAPILLARY
GLUCOSE-CAPILLARY: 286 mg/dL — AB (ref 65–99)
Glucose-Capillary: 261 mg/dL — ABNORMAL HIGH (ref 65–99)

## 2017-04-07 NOTE — Progress Notes (Signed)
Daily Session Note  Patient Details  Name: Trevor [name] MRN: [id number] Date of Birth: 02-20-1946 Referring Provider:     CARDIAC REHAB PHASE II ORIENTATION from 04/03/2017 in [place]  Referring Provider  Loralie Champagne MD      Encounter Date: 04/07/2017  Check In:     Session Check In - 04/07/17 1046      Check-In   Location [facility]   Staff Present Maurice Small, RN, Lise Auer, MS, Exercise Physiologist;Maria Whitaker, RN, BSN;Joann Rion, RN, BSN   Supervising physician immediately available to respond to emergencies [facility] immediately available   Physician(s) Dr. Broadus John   Medication changes reported     No   Fall or balance concerns reported    No   Tobacco Cessation No Change   Warm-up and Cool-down Performed as group-led instruction   Resistance Training Performed Yes   VAD Patient? Yes     VAD patient   Has back up controller? Yes   Has spare charged batteries? Yes   Has battery cables? Yes   Has compatible battery clips? Yes     Pain Assessment   Currently in Pain? No/denies      Capillary Blood Glucose: Results for orders placed or performed during the hospital encounter of 04/07/17 (from the past 24 hour(s))  Glucose, capillary     Status: Abnormal   Collection Time: 04/07/17  9:41 AM  Result Value Ref Range   Glucose-Capillary 286 (H) 65 - 99 mg/dL  Glucose, capillary     Status: Abnormal   Collection Time: 04/07/17 10:41 AM  Result Value Ref Range   Glucose-Capillary 261 (H) 65 - 99 mg/dL      History  Smoking Status  . Never Smoker  Smokeless Tobacco  . Never Used    Goals Met:  No report of cardiac concerns or symptoms  Goals Unmet:  Not Applicable  Comments: Trevor [name] started cardiac rehab today.  Pt tolerated light exercise without difficulty. Doppler blood pressure initially 102, then blood pressure 118/78 with a map of 93.[name], [place] Coordinator called and  notified. " Trevor [name]"proceeded  To tolerate light exercise without issue. telemetry-Chronic Atrial fibrillation with pacing on demand, asymptomatic.  Medication list reconciled. Pt denies barriers to medicaiton compliance.  PSYCHOSOCIAL ASSESSMENT:  PHQ-0. Pt exhibits positive coping skills, hopeful outlook with supportive family. No psychosocial needs identified at this time, no psychosocial interventions necessary.    Pt enjoys working in his garden and fishing.   Pt oriented to exercise equipment and routine.  Trevor [name] has been encouraged to take rest breaks as needed.Will continue to monitor the patient throughout  the program.   Understanding verbalized.Barnet Pall, RN,BSN 04/07/2017 12:34 PM   Dr. Fransico Him is Medical Director for Cardiac Rehab at [facility].

## 2017-04-08 ENCOUNTER — Telehealth (HOSPITAL_COMMUNITY): Payer: Self-pay | Admitting: Pharmacist

## 2017-04-08 NOTE — Telephone Encounter (Signed)
Sildenafil 40 mg TID PA approved by [employer] through 11/03/17.   Ruta Hinds. Velva Harman, PharmD, BCPS, CPP Clinical Pharmacist Pager: [phone] Phone: [phone] 04/08/2017 11:56 AM

## 2017-04-09 ENCOUNTER — Other Ambulatory Visit (HOSPITAL_COMMUNITY): Payer: Self-pay | Admitting: *Deleted

## 2017-04-09 ENCOUNTER — Encounter (HOSPITAL_COMMUNITY)
Admission: RE | Admit: 2017-04-09 | Discharge: 2017-04-09 | Disposition: A | Discharge status: Home / Self Care | Payer: Medicare Other | Source: Ambulatory Visit | Attending: Cardiology | Admitting: Cardiology

## 2017-04-09 DIAGNOSIS — Z95811 Presence of heart assist device: Secondary | ICD-10-CM | POA: Diagnosis not present

## 2017-04-09 DIAGNOSIS — I5043 Acute on chronic combined systolic (congestive) and diastolic (congestive) heart failure: Secondary | ICD-10-CM

## 2017-04-09 DIAGNOSIS — I5022 Chronic systolic (congestive) heart failure: Secondary | ICD-10-CM

## 2017-04-09 DIAGNOSIS — Z7901 Long term (current) use of anticoagulants: Secondary | ICD-10-CM

## 2017-04-09 LAB — GLUCOSE, CAPILLARY
GLUCOSE-CAPILLARY: 158 mg/dL — AB (ref 65–99)
GLUCOSE-CAPILLARY: 95 mg/dL (ref 65–99)

## 2017-04-10 ENCOUNTER — Ambulatory Visit (HOSPITAL_COMMUNITY)
Admission: RE | Admit: 2017-04-10 | Discharge: 2017-04-10 | Disposition: A | Discharge status: Home / Self Care | Payer: Medicare Other | Source: Ambulatory Visit | Attending: Cardiology | Admitting: Cardiology

## 2017-04-10 ENCOUNTER — Ambulatory Visit (HOSPITAL_COMMUNITY): Payer: Self-pay | Admitting: Pharmacist

## 2017-04-10 ENCOUNTER — Encounter (HOSPITAL_COMMUNITY)
Admission: RE | Admit: 2017-04-10 | Discharge: 2017-04-10 | Disposition: A | Discharge status: Home / Self Care | Payer: Medicare Other | Source: Ambulatory Visit | Attending: Cardiology | Admitting: Cardiology

## 2017-04-10 DIAGNOSIS — Z95811 Presence of heart assist device: Secondary | ICD-10-CM

## 2017-04-10 DIAGNOSIS — Z7901 Long term (current) use of anticoagulants: Secondary | ICD-10-CM | POA: Diagnosis present

## 2017-04-10 DIAGNOSIS — I5043 Acute on chronic combined systolic (congestive) and diastolic (congestive) heart failure: Secondary | ICD-10-CM

## 2017-04-10 LAB — BASIC METABOLIC PANEL
ANION GAP: 11 (ref 5–15)
BUN: 59 mg/dL — ABNORMAL HIGH (ref 6–20)
CHLORIDE: 92 mmol/L — AB (ref 101–111)
CO2: 29 mmol/L (ref 22–32)
CREATININE: 2.89 mg/dL — AB (ref 0.61–1.24)
Calcium: 8.7 mg/dL — ABNORMAL LOW (ref 8.9–10.3)
GFR calc non Af Amer: 20 mL/min — ABNORMAL LOW (ref >60)
GFR, EST AFRICAN AMERICAN: 24 mL/min — AB (ref >60)
Glucose, Bld: 209 mg/dL — ABNORMAL HIGH (ref 65–99)
Potassium: 4 mmol/L (ref 3.5–5.1)
Sodium: 132 mmol/L — ABNORMAL LOW (ref 135–145)

## 2017-04-10 LAB — PROTIME-INR
INR: 2.46
Prothrombin Time: 27.1 seconds — ABNORMAL HIGH (ref 11.4–15.2)

## 2017-04-10 MED ORDER — OCTREOTIDE ACETATE 20 MG IM KIT: 20.0000 mg | PACK | INTRAMUSCULAR | 2 refills | Status: AC

## 2017-04-10 MED ORDER — OCTREOTIDE ACETATE 20 MG IM KIT
20.0000 mg | PACK | Freq: Once | INTRAMUSCULAR | Status: DC
  Administered 2017-04-10: 10:00:00 20 mg via INTRAMUSCULAR
  Filled 2017-04-10 (×2): qty 1

## 2017-04-11 ENCOUNTER — Encounter (HOSPITAL_COMMUNITY)
Admission: RE | Admit: 2017-04-11 | Discharge: 2017-04-11 | Disposition: A | Discharge status: Home / Self Care | Payer: Medicare Other | Source: Ambulatory Visit | Attending: Cardiology | Admitting: Cardiology

## 2017-04-14 ENCOUNTER — Encounter (HOSPITAL_COMMUNITY): Payer: Self-pay | Admitting: Anesthesiology

## 2017-04-14 ENCOUNTER — Encounter (HOSPITAL_COMMUNITY)
Admission: RE | Admit: 2017-04-14 | Discharge: 2017-04-14 | Disposition: A | Discharge status: Home / Self Care | Payer: Medicare Other | Source: Ambulatory Visit | Attending: Cardiology | Admitting: Cardiology

## 2017-04-14 DIAGNOSIS — I5022 Chronic systolic (congestive) heart failure: Secondary | ICD-10-CM

## 2017-04-14 DIAGNOSIS — Z95811 Presence of heart assist device: Secondary | ICD-10-CM

## 2017-04-14 LAB — GLUCOSE, CAPILLARY
Glucose-Capillary: 165 mg/dL — ABNORMAL HIGH (ref 65–99)
Glucose-Capillary: 116 mg/dL — ABNORMAL HIGH (ref 65–99)

## 2017-04-15 ENCOUNTER — Encounter (HOSPITAL_COMMUNITY): Payer: Self-pay | Admitting: *Deleted

## 2017-04-15 NOTE — Progress Notes (Signed)
Patient presents to clinic today for drive line exit wound care. Existing VAD dressing removed and site care performed using sterile technique. Drive line exit site cleaned with sterile saline wipes, allowed to dry, and gauze dressing with silver aquacell AG re-applied. Exit site has small dried scab noted, the velour is fully implanted at exit site. No redness, tenderness, drainage, foul odor or rash noted. Drive line anchor re-applied. Pt denies fever or chills.   Patient states that his sister, [name], is having hip surgery at the beginning of July and will need assistance with dressing changes during her initial healing phase. I instructed him to just make appointments for dressing changes and that we would be happy to provide assistance with drive line dressing care.   Return to clinic at previously scheduled appointment.  Balinda Quails RN, VAD Coordinator 24/7 pager [phone]

## 2017-04-16 ENCOUNTER — Encounter (HOSPITAL_COMMUNITY)
Admission: RE | Admit: 2017-04-16 | Discharge: 2017-04-16 | Disposition: A | Discharge status: Home / Self Care | Payer: Medicare Other | Source: Ambulatory Visit | Attending: Cardiology | Admitting: Cardiology

## 2017-04-16 DIAGNOSIS — Z95811 Presence of heart assist device: Secondary | ICD-10-CM

## 2017-04-16 DIAGNOSIS — I5022 Chronic systolic (congestive) heart failure: Secondary | ICD-10-CM

## 2017-04-17 ENCOUNTER — Telehealth (HOSPITAL_COMMUNITY): Payer: Self-pay | Admitting: Pharmacist

## 2017-04-17 NOTE — Telephone Encounter (Signed)
Sandostatin outpatient coverage rejected by his Medicare Part D. Patient will continue to receive therapy in [place] [facility], covered by his Medicare Part B.   Confirmed with pharmacy that sildenafil 40 mg TID will be covered by insurance.    Carlean Jews, Pharm.D. PGY1 Pharmacy Resident 6/14/20185:15 PM Pager [phone]

## 2017-04-18 ENCOUNTER — Encounter (HOSPITAL_COMMUNITY)
Admission: RE | Admit: 2017-04-18 | Discharge: 2017-04-18 | Disposition: A | Discharge status: Home / Self Care | Payer: Medicare Other | Source: Ambulatory Visit | Attending: Cardiology | Admitting: Cardiology

## 2017-04-18 DIAGNOSIS — I5022 Chronic systolic (congestive) heart failure: Secondary | ICD-10-CM

## 2017-04-18 DIAGNOSIS — Z95811 Presence of heart assist device: Secondary | ICD-10-CM | POA: Diagnosis not present

## 2017-04-18 LAB — GLUCOSE, CAPILLARY: Glucose-Capillary: 170 mg/dL — ABNORMAL HIGH (ref 65–99)

## 2017-04-21 ENCOUNTER — Encounter (HOSPITAL_COMMUNITY)
Admission: RE | Admit: 2017-04-21 | Discharge: 2017-04-21 | Disposition: A | Discharge status: Home / Self Care | Payer: Medicare Other | Source: Ambulatory Visit | Attending: Cardiology | Admitting: Cardiology

## 2017-04-21 DIAGNOSIS — I5022 Chronic systolic (congestive) heart failure: Secondary | ICD-10-CM

## 2017-04-21 DIAGNOSIS — Z95811 Presence of heart assist device: Secondary | ICD-10-CM | POA: Diagnosis not present

## 2017-04-22 NOTE — Progress Notes (Signed)
Cardiac Individual Treatment Plan  Patient Details  Name: Trevor [name] MRN: [id number] Date of Birth: 08-30-1946 Referring Provider:     CARDIAC REHAB PHASE II ORIENTATION from 04/03/2017 in [place]  Referring Provider  Loralie Champagne MD      Initial Encounter Date:    CARDIAC REHAB PHASE II ORIENTATION from 04/03/2017 in [place]  Date  04/03/17  Referring Provider  Loralie Champagne MD      Visit Diagnosis: 12/13/16 LVAD (left ventricular assist device) present ([place])  Heart failure, chronic systolic ([place])  Patient's Home Medications on Admission:  Current Outpatient Prescriptions:  .  albuterol (PROVENTIL HFA;VENTOLIN HFA) 108 (90 Base) MCG/ACT inhaler, Inhale 2 puffs into the lungs every 4 (four) hours as needed for wheezing or shortness of breath., Disp: 1 Inhaler, Rfl: 5 .  albuterol (PROVENTIL) (2.5 MG/3ML) 0.083% nebulizer solution, Take 3 mLs (2.5 mg total) by nebulization every 4 (four) hours. And as needed, Disp: 150 mL, Rfl: 5 .  amLODipine (NORVASC) 5 MG tablet, Take 1 tablet (5 mg total) by mouth daily., Disp: 30 tablet, Rfl: 6 .  ascorbic acid (VITAMIN C) 1000 MG tablet, Take 1,000 mg by mouth daily at 12 noon. , Disp: , Rfl:  .  atorvastatin (LIPITOR) 40 MG tablet, Take 1 tablet (40 mg total) by mouth daily., Disp: 30 tablet, Rfl: 6 .  benzonatate (TESSALON) 100 MG capsule, Take 1 capsule (100 mg total) by mouth 3 (three) times daily as needed for cough., Disp: 20 capsule, Rfl: 6 .  busPIRone (BUSPAR) 5 MG tablet, Take 1 tablet (5 mg total) by mouth 2 (two) times daily. (Patient taking differently: Take 5 mg by mouth 2 (two) times daily. Morning & bedtime), Disp: 60 tablet, Rfl: 6 .  docusate sodium (COLACE) 100 MG capsule, Take 1 capsule (100 mg total) by mouth 2 (two) times daily., Disp: 60 capsule, Rfl: 12 .  fluticasone (FLOVENT HFA) 110 MCG/ACT inhaler, Inhale 2 puffs into the lungs 2 (two) times  daily. (Patient taking differently: Inhale 2 puffs into the lungs 2 (two) times daily. MORNING & BEDTIME), Disp: 1 Inhaler, Rfl: 5 .  gabapentin (NEURONTIN) 600 MG tablet, Take 0.5 tablets (300 mg total) by mouth 2 (two) times daily., Disp: 60 tablet, Rfl: 6 .  insulin glargine (LANTUS) 100 unit/mL SOPN, Inject 0.2 mLs (20 Units total) into the skin at bedtime., Disp: 15 mL, Rfl: 0 .  levothyroxine (SYNTHROID, LEVOTHROID) 25 MCG tablet, Take 1 tablet (25 mcg total) by mouth daily before breakfast., Disp: 30 tablet, Rfl: 6 .  metolazone (ZAROXOLYN) 2.5 MG tablet, Take 1 tablet (2.5 mg total) by mouth once a week., Disp: 10 tablet, Rfl: 3 .  Multiple Vitamin (MULTIVITAMIN WITH MINERALS) TABS tablet, Take 1 tablet by mouth daily at 12 noon., Disp: , Rfl:  .  octreotide (SANDOSTATIN LAR) 20 MG injection, Inject 20 mg into the muscle every 28 (twenty-eight) days., Disp: 1 each, Rfl: 2 .  pantoprazole (PROTONIX) 40 MG tablet, Take 1 tablet (40 mg total) by mouth daily at 12 noon., Disp: 30 tablet, Rfl: 6 .  potassium chloride SA (K-DUR,KLOR-CON) 20 MEQ tablet, TAKE 2 TABLETS BY MOUTH DAILY WITH AN ADDITIONAL 1 TABLET ONCE WEEKLY ON METOLAZONE DAYS, Disp: 182 tablet, Rfl: 3 .  sildenafil (REVATIO) 20 MG tablet, Take 2 tablets (40 mg total) by mouth 3 (three) times daily., Disp: 180 tablet, Rfl: 6 .  torsemide (DEMADEX) 20 MG tablet, Take 4  tablets (80 mg total) by mouth daily. (Patient taking differently: Take 80 mg by mouth daily. ), Disp: 90 tablet, Rfl: 5 .  traMADol (ULTRAM) 50 MG tablet, TAKE 1 TABLET BY MOUTH EVERY 6 HOURS AS NEEDED FOR MODERATE PAIN, Disp: 30 tablet, Rfl: 2 .  traZODone (DESYREL) 50 MG tablet, TAKE 1 TABLET(50 MG) BY MOUTH AT BEDTIME AS NEEDED FOR SLEEP, Disp: 30 tablet, Rfl: 3 .  warfarin (COUMADIN) 5 MG tablet, Take 1.5 tablets (7.5 mg total) by mouth daily., Disp: 45 tablet, Rfl: 6  Past Medical History: Past Medical History:  Diagnosis Date  . AICD (automatic  cardioverter/defibrillator) present   . Asthma   . CHF (congestive heart failure) ([place])   . Diabetes ([place])   . Kidney disease   . Presence of permanent cardiac pacemaker    AICD  . Sleep apnea     Tobacco Use: History  Smoking Status  . Never Smoker  Smokeless Tobacco  . Never Used    Labs: Recent Review Flowsheet Data    Labs for ITP Cardiac and Pulmonary Rehab Latest Ref Rng & Units 12/31/2016 01/01/2017 03/17/2017 03/27/2017 03/27/2017   Cholestrol 0 - 200 mg/dL - - - - -   LDLCALC 0 - 99 mg/dL - - - - -   HDL >40 mg/dL - - - - -   Trlycerides <150 mg/dL - - - - -   Hemoglobin A1c 4.8 - 5.6 % - - 6.7(H) - -   PHART 7.350 - 7.450 - - - - -   PCO2ART 32.0 - 48.0 mmHg - - - - -   HCO3 20.0 - 28.0 mmol/L - - - 34.4(H) 35.2(H)   TCO2 0 - 100 mmol/L - - - 36 37   O2SAT % 60.9 59.0 - 53.0 53.0      Capillary Blood Glucose: Lab Results  Component Value Date   GLUCAP 170 (H) 04/18/2017   GLUCAP 116 (H) 04/14/2017   GLUCAP 165 (H) 04/14/2017   GLUCAP 95 04/09/2017   GLUCAP 158 (H) 04/09/2017     Exercise Target Goals:    Exercise Program Goal: Individual exercise prescription set with THRR, safety & activity barriers. Participant demonstrates ability to understand and report RPE using BORG scale, to self-measure pulse accurately, and to acknowledge the importance of the exercise prescription.  Exercise Prescription Goal: Starting with aerobic activity 30 plus minutes a day, 3 days per week for initial exercise prescription. Provide home exercise prescription and guidelines that participant acknowledges understanding prior to discharge.  Activity Barriers & Risk Stratification:     Activity Barriers & Cardiac Risk Stratification - 04/03/17 0832      Activity Barriers & Cardiac Risk Stratification   Activity Barriers Arthritis;Deconditioning;Muscular Weakness;Shortness of Breath   Cardiac Risk Stratification High      6 Minute Walk:     6 Minute Walk    Row Name  04/03/17 1611 04/03/17 1618       6 Minute Walk   Phase Initial  -    Distance 830 feet  -    Walk Time 4.55 minutes  -    # of Rest Breaks 0 2    MPH 2.1  -    METS 1.7  -    RPE 13  -    VO2 Peak 5.9  -    Symptoms No  -    Resting HR 70 bpm  -    Resting BP 78/0  -    Max  Ex. HR 125 bpm  -    Max Ex. BP 96/0  -    2 Minute Post BP 84/0  -       Oxygen Initial Assessment:   Oxygen Re-Evaluation:   Oxygen Discharge (Final Oxygen Re-Evaluation):   Initial Exercise Prescription:     Initial Exercise Prescription - 04/03/17 1100      Date of Initial Exercise RX and Referring Provider   Date 04/03/17   Referring Provider Loralie Champagne MD     NuStep   Level 2   Minutes 10   METs 2     Arm Ergometer   Level 1   Watts 15   Minutes 10   METs 2     Track   Laps 8   Minutes 10   METs 2.4     Prescription Details   Frequency (times per week) 3   Duration Progress to 45 minutes of aerobic exercise without signs/symptoms of physical distress     Intensity   THRR 40-80% of Max Heartrate 60-119   Ratings of Perceived Exertion 11-13   Perceived Dyspnea 0-4     Progression   Progression Continue to progress workloads to maintain intensity without signs/symptoms of physical distress.     Resistance Training   Training Prescription Yes   Weight 2   Reps 10-15      Perform Capillary Blood Glucose checks as needed.  Exercise Prescription Changes:      Exercise Prescription Changes    Row Name 04/23/17 1200             Response to Exercise   Blood Pressure (Admit) 95/68       Blood Pressure (Exercise) 107/74       Blood Pressure (Exit) 98/74       Heart Rate (Admit) 76 bpm       Heart Rate (Exercise) 90 bpm       Heart Rate (Exit) 74 bpm       Rating of Perceived Exertion (Exercise) 11       Duration Progress to 45 minutes of aerobic exercise without signs/symptoms of physical distress       Intensity THRR unchanged         Progression    Progression Continue to progress workloads to maintain intensity without signs/symptoms of physical distress.       Average METs 2.3         Resistance Training   Training Prescription Yes       Weight 2       Reps 10-15         NuStep   Level 2       Minutes 10       METs 2         Arm Ergometer   Level 1       Watts 15       Minutes 10       METs 2.62         Track   Laps 8       Minutes 10       METs 2.4         Home Exercise Plan   Plans to continue exercise at Home (comment)       Frequency Add 3 additional days to program exercise sessions.          Exercise Comments:      Exercise Comments    Row Name 04/23/17 1159  Exercise Comments Revivewed METs and goals with pt.           Exercise Goals and Review:      Exercise Goals    Row Name 04/03/17 [phone]             Exercise Goals   Increase Physical Activity Yes       Intervention Provide advice, education, support and counseling about physical activity/exercise needs.;Develop an individualized exercise prescription for aerobic and resistive training based on initial evaluation findings, risk stratification, comorbidities and participant's personal goals.       Expected Outcomes Achievement of increased cardiorespiratory fitness and enhanced flexibility, muscular endurance and strength shown through measurements of functional capacity and personal statement of participant.       Increase Strength and Stamina Yes  be able to climb stairs and uphill without SOB       Intervention Provide advice, education, support and counseling about physical activity/exercise needs.;Develop an individualized exercise prescription for aerobic and resistive training based on initial evaluation findings, risk stratification, comorbidities and participant's personal goals.       Expected Outcomes Achievement of increased cardiorespiratory fitness and enhanced flexibility, muscular endurance and strength shown  through measurements of functional capacity and personal statement of participant.          Exercise Goals Re-Evaluation :     Exercise Goals Re-Evaluation    Row Name 04/23/17 1158             Exercise Goal Re-Evaluation   Exercise Goals Review Increase Physical Activity;Increase Strenth and Stamina       Comments Pt is doing well with exercise and states that he feels pretty good overall       Expected Outcomes Continue with exercise Rx and increase workloads as tolerated in order to increase exercise tolerance           Discharge Exercise Prescription (Final Exercise Prescription Changes):     Exercise Prescription Changes - 04/23/17 1200      Response to Exercise   Blood Pressure (Admit) 95/68   Blood Pressure (Exercise) 107/74   Blood Pressure (Exit) 98/74   Heart Rate (Admit) 76 bpm   Heart Rate (Exercise) 90 bpm   Heart Rate (Exit) 74 bpm   Rating of Perceived Exertion (Exercise) 11   Duration Progress to 45 minutes of aerobic exercise without signs/symptoms of physical distress   Intensity THRR unchanged     Progression   Progression Continue to progress workloads to maintain intensity without signs/symptoms of physical distress.   Average METs 2.3     Resistance Training   Training Prescription Yes   Weight 2   Reps 10-15     NuStep   Level 2   Minutes 10   METs 2     Arm Ergometer   Level 1   Watts 15   Minutes 10   METs 2.62     Track   Laps 8   Minutes 10   METs 2.4     Home Exercise Plan   Plans to continue exercise at Home (comment)   Frequency Add 3 additional days to program exercise sessions.      Nutrition:  Target Goals: Understanding of nutrition guidelines, daily intake of sodium 1500mg , cholesterol 200mg , calories 30% from fat and 7% or less from saturated fats, daily to have 5 or more servings of fruits and vegetables.  Biometrics:     Pre Biometrics - 04/03/17 1608  Pre Biometrics   Height 5' 3.5" (1.613 m)    Weight 179 lb 14.3 oz (81.6 kg)   Waist Circumference 40.75 inches   Hip Circumference 41.5 inches   Waist to Hip Ratio 0.98 %   BMI (Calculated) 31.4   Triceps Skinfold 20 mm   % Body Fat 31.2 %   Grip Strength 33.5 kg   Flexibility 13 in   Single Leg Stand 2.2 seconds       Nutrition Therapy Plan and Nutrition Goals:   Nutrition Discharge: Nutrition Scores:   Nutrition Goals Re-Evaluation:   Nutrition Goals Re-Evaluation:   Nutrition Goals Discharge (Final Nutrition Goals Re-Evaluation):   Psychosocial: Target Goals: Acknowledge presence or absence of significant depression and/or stress, maximize coping skills, provide positive support system. Participant is able to verbalize types and ability to use techniques and skills needed for reducing stress and depression.  Initial Review & Psychosocial Screening:     Initial Psych Review & Screening - 04/03/17 1605      Initial Review   Current issues with None Identified     Family Dynamics   Good Support System? Yes     Barriers   Psychosocial barriers to participate in program The patient should benefit from training in stress management and relaxation.;There are no identifiable barriers or psychosocial needs.     Screening Interventions   Interventions Encouraged to exercise      Quality of Life Scores:     Quality of Life - 04/03/17 1145      Quality of Life Scores   Health/Function Pre 19 %   Socioeconomic Pre 23.17 %   Psych/Spiritual Pre 16.29 %   Family Pre 20.83 %   GLOBAL Pre 19.37 %      PHQ-9: Recent Review Flowsheet Data    Depression screen [facility] 2/9 04/07/2017 01/17/2017   Decreased Interest 0 0   Down, Depressed, Hopeless 0 0   PHQ - 2 Score 0 0     Interpretation of Total Score  Total Score Depression Severity:  1-4 = Minimal depression, 5-9 = Mild depression, 10-14 = Moderate depression, 15-19 = Moderately severe depression, 20-27 = Severe depression   Psychosocial Evaluation and  Intervention:   Psychosocial Re-Evaluation:     Psychosocial Re-Evaluation    [place] Name 04/22/17 1103             Psychosocial Re-Evaluation   Current issues with Current Stress Concerns       Interventions Stress management education;Encouraged to attend Cardiac Rehabilitation for the exercise       Continue Psychosocial Services  No Follow up required         Initial Review   Source of Stress Concerns Chronic Illness          Psychosocial Discharge (Final Psychosocial Re-Evaluation):     Psychosocial Re-Evaluation - 04/22/17 1103      Psychosocial Re-Evaluation   Current issues with Current Stress Concerns   Interventions Stress management education;Encouraged to attend Cardiac Rehabilitation for the exercise   Continue Psychosocial Services  No Follow up required     Initial Review   Source of Stress Concerns Chronic Illness      Vocational Rehabilitation: Provide vocational rehab assistance to qualifying candidates.   Vocational Rehab Evaluation & Intervention:     Vocational Rehab - 04/03/17 1602      Initial Vocational Rehab Evaluation & Intervention   Assessment shows need for Vocational Rehabilitation No  Mr Robison is retired and does not  need vocational rehab at this time.      Education: Education Goals: Education classes will be provided on a weekly basis, covering required topics. Participant will state understanding/return demonstration of topics presented.  Learning Barriers/Preferences:     Learning Barriers/Preferences - 04/03/17 4008      Learning Barriers/Preferences   Learning Barriers Sight   Learning Preferences Skilled Demonstration;Verbal Instruction;Video;Written Material      Education Topics: Count Your Pulse:  -Group instruction provided by verbal instruction, demonstration, patient participation and written materials to support subject.  Instructors address importance of being able to find your pulse and how to count your  pulse when at home without a heart monitor.  Patients get hands on experience counting their pulse with staff help and individually.   Heart Attack, Angina, and Risk Factor Modification:  -Group instruction provided by verbal instruction, video, and written materials to support subject.  Instructors address signs and symptoms of angina and heart attacks.    Also discuss risk factors for heart disease and how to make changes to improve heart health risk factors.   Functional Fitness:  -Group instruction provided by verbal instruction, demonstration, patient participation, and written materials to support subject.  Instructors address safety measures for doing things around the house.  Discuss how to get up and down off the floor, how to pick things up properly, how to safely get out of a chair without assistance, and balance training.   Meditation and Mindfulness:  -Group instruction provided by verbal instruction, patient participation, and written materials to support subject.  Instructor addresses importance of mindfulness and meditation practice to help reduce stress and improve awareness.  Instructor also leads participants through a meditation exercise.    Stretching for Flexibility and Mobility:  -Group instruction provided by verbal instruction, patient participation, and written materials to support subject.  Instructors lead participants through series of stretches that are designed to increase flexibility thus improving mobility.  These stretches are additional exercise for major muscle groups that are typically performed during regular warm up and cool down.   Hands Only CPR:  -Group verbal, video, and participation provides a basic overview of AHA guidelines for community CPR. Role-play of emergencies allow participants the opportunity to practice calling for help and chest compression technique with discussion of AED use.   Hypertension: -Group verbal and written instruction that  provides a basic overview of hypertension including the most recent diagnostic guidelines, risk factor reduction with self-care instructions and medication management.    Nutrition I class: Heart Healthy Eating:  -Group instruction provided by PowerPoint slides, verbal discussion, and written materials to support subject matter. The instructor gives an explanation and review of the Therapeutic Lifestyle Changes diet recommendations, which includes a discussion on lipid goals, dietary fat, sodium, fiber, plant stanol/sterol esters, sugar, and the components of a well-balanced, healthy diet.   Nutrition II class: Lifestyle Skills:  -Group instruction provided by PowerPoint slides, verbal discussion, and written materials to support subject matter. The instructor gives an explanation and review of label reading, grocery shopping for heart health, heart healthy recipe modifications, and ways to make healthier choices when eating out.   Diabetes Question & Answer:  -Group instruction provided by PowerPoint slides, verbal discussion, and written materials to support subject matter. The instructor gives an explanation and review of diabetes co-morbidities, pre- and post-prandial blood glucose goals, pre-exercise blood glucose goals, signs, symptoms, and treatment of hypoglycemia and hyperglycemia, and foot care basics.   Diabetes Blitz:  -Group instruction  provided by PowerPoint slides, verbal discussion, and written materials to support subject matter. The instructor gives an explanation and review of the physiology behind type 1 and type 2 diabetes, diabetes medications and rational behind using different medications, pre- and post-prandial blood glucose recommendations and Hemoglobin A1c goals, diabetes diet, and exercise including blood glucose guidelines for exercising safely.    Portion Distortion:  -Group instruction provided by PowerPoint slides, verbal discussion, written materials, and food  models to support subject matter. The instructor gives an explanation of serving size versus portion size, changes in portions sizes over the last 20 years, and what consists of a serving from each food group.   Stress Management:  -Group instruction provided by verbal instruction, video, and written materials to support subject matter.  Instructors review role of stress in heart disease and how to cope with stress positively.     Exercising on Your Own:  -Group instruction provided by verbal instruction, power point, and written materials to support subject.  Instructors discuss benefits of exercise, components of exercise, frequency and intensity of exercise, and end points for exercise.  Also discuss use of nitroglycerin and activating EMS.  Review options of places to exercise outside of rehab.  Review guidelines for sex with heart disease.   Cardiac Drugs I:  -Group instruction provided by verbal instruction and written materials to support subject.  Instructor reviews cardiac drug classes: antiplatelets, anticoagulants, beta blockers, and statins.  Instructor discusses reasons, side effects, and lifestyle considerations for each drug class.   Cardiac Drugs II:  -Group instruction provided by verbal instruction and written materials to support subject.  Instructor reviews cardiac drug classes: angiotensin converting enzyme inhibitors (ACE-I), angiotensin II receptor blockers (ARBs), nitrates, and calcium channel blockers.  Instructor discusses reasons, side effects, and lifestyle considerations for each drug class.   Anatomy and Physiology of the Circulatory System:  Group verbal and written instruction and models provide basic cardiac anatomy and physiology, with the coronary electrical and arterial systems. Review of: AMI, Angina, Valve disease, Heart Failure, Peripheral Artery Disease, Cardiac Arrhythmia, Pacemakers, and the ICD.   Other Education:  -Group or individual verbal,  written, or video instructions that support the educational goals of the cardiac rehab program.   Knowledge Questionnaire Score:     Knowledge Questionnaire Score - 04/03/17 1138      Knowledge Questionnaire Score   Pre Score 21/24      Core Components/Risk Factors/Patient Goals at Admission:     Personal Goals and Risk Factors at Admission - 04/03/17 1600      Core Components/Risk Factors/Patient Goals on Admission    Weight Management Obesity;Yes;Weight Loss;Weight Maintenance   Intervention Weight Management: Develop a combined nutrition and exercise program designed to reach desired caloric intake, while maintaining appropriate intake of nutrient and fiber, sodium and fats, and appropriate energy expenditure required for the weight goal.;Weight Management: Provide education and appropriate resources to help participant work on and attain dietary goals.;Weight Management/Obesity: Establish reasonable short term and long term weight goals.;Obesity: Provide education and appropriate resources to help participant work on and attain dietary goals.   Admit Weight 179 lb 14.3 oz (81.6 kg)   Goal Weight: Short Term 169 lb (76.7 kg)   Goal Weight: Long Term 164 lb (74.4 kg)   Improve shortness of breath with ADL's Yes   Intervention Provide education, individualized exercise plan and daily activity instruction to help decrease symptoms of SOB with activities of daily living.   Expected Outcomes Short Term:  Achieves a reduction of symptoms when performing activities of daily living.   Diabetes Yes   Intervention Provide education about signs/symptoms and action to take for hypo/hyperglycemia.;Provide education about proper nutrition, including hydration, and aerobic/resistive exercise prescription along with prescribed medications to achieve blood glucose in normal ranges: Fasting glucose 65-99 mg/dL   Expected Outcomes Short Term: Participant verbalizes understanding of the signs/symptoms and  immediate care of hyper/hypoglycemia, proper foot care and importance of medication, aerobic/resistive exercise and nutrition plan for blood glucose control.;Long Term: Attainment of HbA1C < 7%.   Heart Failure Yes   Intervention Provide a combined exercise and nutrition program that is supplemented with education, support and counseling about heart failure. Directed toward relieving symptoms such as shortness of breath, decreased exercise tolerance, and extremity edema.   Expected Outcomes Improve functional capacity of life;Short term: Attendance in program 2-3 days a week with increased exercise capacity. Reported lower sodium intake. Reported increased fruit and vegetable intake. Reports medication compliance.;Short term: Daily weights obtained and reported for increase. Utilizing diuretic protocols set by physician.;Long term: Adoption of self-care skills and reduction of barriers for early signs and symptoms recognition and intervention leading to self-care maintenance.   Hypertension Yes   Intervention Provide education on lifestyle modifcations including regular physical activity/exercise, weight management, moderate sodium restriction and increased consumption of fresh fruit, vegetables, and low fat dairy, alcohol moderation, and smoking cessation.;Monitor prescription use compliance.   Expected Outcomes Short Term: Continued assessment and intervention until BP is < 140/34mm HG in hypertensive participants. < 130/85mm HG in hypertensive participants with diabetes, heart failure or chronic kidney disease.;Long Term: Maintenance of blood pressure at goal levels.   Lipids Yes   Intervention Provide education and support for participant on nutrition & aerobic/resistive exercise along with prescribed medications to achieve LDL 70mg , HDL >40mg .   Expected Outcomes Short Term: Participant states understanding of desired cholesterol values and is compliant with medications prescribed. Participant is  following exercise prescription and nutrition guidelines.;Long Term: Cholesterol controlled with medications as prescribed, with individualized exercise RX and with personalized nutrition plan. Value goals: LDL < 70mg , HDL > 40 mg.   Stress Yes   Intervention Offer individual and/or small group education and counseling on adjustment to heart disease, stress management and health-related lifestyle change. Teach and support self-help strategies.;Refer participants experiencing significant psychosocial distress to appropriate mental health specialists for further evaluation and treatment. When possible, include family members and significant others in education/counseling sessions.   Expected Outcomes Short Term: Participant demonstrates changes in health-related behavior, relaxation and other stress management skills, ability to obtain effective social support, and compliance with psychotropic medications if prescribed.;Long Term: Emotional wellbeing is indicated by absence of clinically significant psychosocial distress or social isolation.      Core Components/Risk Factors/Patient Goals Review:    Core Components/Risk Factors/Patient Goals at Discharge (Final Review):    ITP Comments:     ITP Comments    Row Name 04/03/17 0750           ITP Comments Medical Director, Dr. Fransico Him          Comments: [name] is making expected progress toward personal goals after completing 7 sessions. Recommend continued exercise and life style modification education including  stress management and relaxation techniques to decrease cardiac risk profile. [name] is doing well with exercise and is enjoying participating in the program..Maria [place], RN,BSN 04/23/2017 4:52 PM

## 2017-04-23 ENCOUNTER — Encounter (HOSPITAL_COMMUNITY)
Admission: RE | Admit: 2017-04-23 | Discharge: 2017-04-23 | Disposition: A | Discharge status: Home / Self Care | Payer: Medicare Other | Source: Ambulatory Visit | Attending: Cardiology | Admitting: Cardiology

## 2017-04-23 DIAGNOSIS — I5022 Chronic systolic (congestive) heart failure: Secondary | ICD-10-CM

## 2017-04-23 DIAGNOSIS — Z95811 Presence of heart assist device: Secondary | ICD-10-CM

## 2017-04-23 NOTE — Progress Notes (Signed)
[name]'s weight is up 1 kg from Wednesday at 83.0 kg.  Lung fields clear upon ascultation. [name] denies shortness of breath. Upon assessment lung fields clear upon ascultation. Molly LVAD coordinator called and notified about today's weight gain. No new order received.Will continue to monitor the patient throughout  the program.Maria [name], RN,BSN 04/23/2017 11:13 AM

## 2017-04-24 ENCOUNTER — Ambulatory Visit (HOSPITAL_COMMUNITY)
Admission: RE | Admit: 2017-04-24 | Discharge: 2017-04-24 | Disposition: A | Discharge status: Home / Self Care | Payer: Medicare Other | Source: Ambulatory Visit | Attending: Internal Medicine | Admitting: Internal Medicine

## 2017-04-24 ENCOUNTER — Telehealth (HOSPITAL_COMMUNITY): Payer: Self-pay | Admitting: Unknown Physician Specialty

## 2017-04-24 ENCOUNTER — Ambulatory Visit (HOSPITAL_COMMUNITY): Payer: Self-pay | Admitting: Pharmacist

## 2017-04-24 DIAGNOSIS — Z95811 Presence of heart assist device: Secondary | ICD-10-CM

## 2017-04-24 LAB — BASIC METABOLIC PANEL
Anion gap: 11 (ref 5–15)
BUN: 64 mg/dL — AB (ref 6–20)
CHLORIDE: 92 mmol/L — AB (ref 101–111)
CO2: 27 mmol/L (ref 22–32)
Calcium: 8.8 mg/dL — ABNORMAL LOW (ref 8.9–10.3)
Creatinine, Ser: 3.6 mg/dL — ABNORMAL HIGH (ref 0.61–1.24)
GFR calc Af Amer: 18 mL/min — ABNORMAL LOW (ref >60)
GFR calc non Af Amer: 16 mL/min — ABNORMAL LOW (ref >60)
Glucose, Bld: 240 mg/dL — ABNORMAL HIGH (ref 65–99)
POTASSIUM: 3.8 mmol/L (ref 3.5–5.1)
SODIUM: 130 mmol/L — AB (ref 135–145)

## 2017-04-24 LAB — PROTIME-INR
INR: 3.01
Prothrombin Time: 31.9 seconds — ABNORMAL HIGH (ref 11.4–15.2)

## 2017-04-24 MED ORDER — TORSEMIDE 20 MG PO TABS: 40.0000 mg | ORAL_TABLET | Freq: Every day | ORAL | 5 refills | Status: DC

## 2017-04-24 NOTE — Telephone Encounter (Signed)
pts CR today is 3.6 up from 2.89 on 6/7. Pt states he is taking Metolazone on Wednesday. Pt took this yesterday. Pt was instructed to decrease Demadex to 40mg  and hold next dose of metolazone. Pt is already scheduled for follow up next Thursday 6/28.

## 2017-04-25 ENCOUNTER — Encounter (HOSPITAL_COMMUNITY)
Admission: RE | Admit: 2017-04-25 | Discharge: 2017-04-25 | Disposition: A | Discharge status: Home / Self Care | Payer: Medicare Other | Source: Ambulatory Visit | Attending: Cardiology | Admitting: Cardiology

## 2017-04-25 DIAGNOSIS — Z95811 Presence of heart assist device: Secondary | ICD-10-CM | POA: Diagnosis not present

## 2017-04-25 DIAGNOSIS — I5022 Chronic systolic (congestive) heart failure: Secondary | ICD-10-CM

## 2017-04-28 ENCOUNTER — Encounter (HOSPITAL_COMMUNITY)
Admission: RE | Admit: 2017-04-28 | Discharge: 2017-04-28 | Disposition: A | Discharge status: Home / Self Care | Payer: Medicare Other | Source: Ambulatory Visit | Attending: Cardiology | Admitting: Cardiology

## 2017-04-28 DIAGNOSIS — I5022 Chronic systolic (congestive) heart failure: Secondary | ICD-10-CM

## 2017-04-28 DIAGNOSIS — Z95811 Presence of heart assist device: Secondary | ICD-10-CM | POA: Diagnosis not present

## 2017-04-30 ENCOUNTER — Encounter (HOSPITAL_COMMUNITY)
Admission: RE | Admit: 2017-04-30 | Discharge: 2017-04-30 | Disposition: A | Discharge status: Home / Self Care | Payer: Medicare Other | Source: Ambulatory Visit | Attending: Cardiology | Admitting: Cardiology

## 2017-04-30 DIAGNOSIS — Z95811 Presence of heart assist device: Secondary | ICD-10-CM

## 2017-04-30 DIAGNOSIS — I5022 Chronic systolic (congestive) heart failure: Secondary | ICD-10-CM

## 2017-04-30 LAB — GLUCOSE, CAPILLARY: GLUCOSE-CAPILLARY: 109 mg/dL — AB (ref 65–99)

## 2017-05-01 ENCOUNTER — Ambulatory Visit (HOSPITAL_COMMUNITY)
Admission: RE | Admit: 2017-05-01 | Discharge: 2017-05-01 | Disposition: A | Discharge status: Home / Self Care | Payer: Medicare Other | Source: Ambulatory Visit | Attending: Cardiology | Admitting: Cardiology

## 2017-05-01 ENCOUNTER — Ambulatory Visit (HOSPITAL_COMMUNITY): Payer: Self-pay | Admitting: Pharmacist

## 2017-05-01 ENCOUNTER — Other Ambulatory Visit (HOSPITAL_COMMUNITY): Payer: Self-pay | Admitting: *Deleted

## 2017-05-01 DIAGNOSIS — I2510 Atherosclerotic heart disease of native coronary artery without angina pectoris: Secondary | ICD-10-CM | POA: Insufficient documentation

## 2017-05-01 DIAGNOSIS — Z95811 Presence of heart assist device: Secondary | ICD-10-CM | POA: Diagnosis present

## 2017-05-01 DIAGNOSIS — I130 Hypertensive heart and chronic kidney disease with heart failure and stage 1 through stage 4 chronic kidney disease, or unspecified chronic kidney disease: Secondary | ICD-10-CM | POA: Insufficient documentation

## 2017-05-01 DIAGNOSIS — D638 Anemia in other chronic diseases classified elsewhere: Secondary | ICD-10-CM | POA: Insufficient documentation

## 2017-05-01 DIAGNOSIS — Z952 Presence of prosthetic heart valve: Secondary | ICD-10-CM | POA: Diagnosis not present

## 2017-05-01 DIAGNOSIS — N184 Chronic kidney disease, stage 4 (severe): Secondary | ICD-10-CM | POA: Insufficient documentation

## 2017-05-01 DIAGNOSIS — I480 Paroxysmal atrial fibrillation: Secondary | ICD-10-CM | POA: Insufficient documentation

## 2017-05-01 DIAGNOSIS — I5043 Acute on chronic combined systolic (congestive) and diastolic (congestive) heart failure: Secondary | ICD-10-CM | POA: Diagnosis present

## 2017-05-01 DIAGNOSIS — G4733 Obstructive sleep apnea (adult) (pediatric): Secondary | ICD-10-CM | POA: Diagnosis not present

## 2017-05-01 DIAGNOSIS — Z951 Presence of aortocoronary bypass graft: Secondary | ICD-10-CM | POA: Insufficient documentation

## 2017-05-01 DIAGNOSIS — E1122 Type 2 diabetes mellitus with diabetic chronic kidney disease: Secondary | ICD-10-CM | POA: Insufficient documentation

## 2017-05-01 DIAGNOSIS — Z7901 Long term (current) use of anticoagulants: Secondary | ICD-10-CM

## 2017-05-01 DIAGNOSIS — I255 Ischemic cardiomyopathy: Secondary | ICD-10-CM | POA: Insufficient documentation

## 2017-05-01 DIAGNOSIS — Z794 Long term (current) use of insulin: Secondary | ICD-10-CM | POA: Insufficient documentation

## 2017-05-01 DIAGNOSIS — J45909 Unspecified asthma, uncomplicated: Secondary | ICD-10-CM | POA: Insufficient documentation

## 2017-05-01 LAB — CBC
HCT: 25 % — ABNORMAL LOW (ref 39.0–52.0)
Hemoglobin: 8.5 g/dL — ABNORMAL LOW (ref 13.0–17.0)
MCH: 32 pg (ref 26.0–34.0)
MCHC: 34 g/dL (ref 30.0–36.0)
MCV: 94 fL (ref 78.0–100.0)
PLATELETS: 120 10*3/uL — AB (ref 150–400)
RBC: 2.66 MIL/uL — ABNORMAL LOW (ref 4.22–5.81)
RDW: 15.9 % — ABNORMAL HIGH (ref 11.5–15.5)
WBC: 9.6 10*3/uL (ref 4.0–10.5)

## 2017-05-01 LAB — PROTIME-INR
INR: 2.41
PROTHROMBIN TIME: 26.7 s — AB (ref 11.4–15.2)

## 2017-05-01 LAB — BASIC METABOLIC PANEL
Anion gap: 10 (ref 5–15)
BUN: 61 mg/dL — ABNORMAL HIGH (ref 6–20)
CALCIUM: 9 mg/dL (ref 8.9–10.3)
CO2: 28 mmol/L (ref 22–32)
CREATININE: 3.43 mg/dL — AB (ref 0.61–1.24)
Chloride: 97 mmol/L — ABNORMAL LOW (ref 101–111)
GFR, EST AFRICAN AMERICAN: 19 mL/min — AB (ref >60)
GFR, EST NON AFRICAN AMERICAN: 17 mL/min — AB (ref >60)
GLUCOSE: 106 mg/dL — AB (ref 65–99)
Potassium: 4.5 mmol/L (ref 3.5–5.1)
Sodium: 135 mmol/L (ref 135–145)

## 2017-05-01 LAB — LACTATE DEHYDROGENASE: LDH: 287 U/L — ABNORMAL HIGH (ref 98–192)

## 2017-05-01 NOTE — Addendum Note (Signed)
Encounter addended by: Lezlie Octave, RN on: 05/01/2017  1:32 PM<BR>    Actions taken: Sign clinical note

## 2017-05-01 NOTE — Addendum Note (Signed)
Encounter addended by: Lezlie Octave, RN on: 05/01/2017  1:30 PM<BR>    Actions taken: Sign clinical note

## 2017-05-01 NOTE — Addendum Note (Signed)
Encounter addended by: Lezlie Octave, RN on: 05/01/2017  5:04 PM<BR>    Actions taken: Sign clinical note

## 2017-05-01 NOTE — Patient Instructions (Addendum)
1.  We increased your LVAD speed to 9400 RPM. 2.  Continue Torsemide 40 mg daily with Metolazone 2.5 mg one time weekly on Wednesday. 3.  We will refer you to EP clinic for ICD follow up. 4.  Return to [facility] clinic in one week for dressing change and lab. Return in one month for visit with [name].

## 2017-05-01 NOTE — Progress Notes (Addendum)
Patient presents for 1one month follow up in [facility] today. Reports no problems with VAD equipment or concerns with drive line.   Vital Signs:  Doppler Pressure 118  Automatc BP: 114/78 (92) HR: 70 SPO2: 99%  Weight: 186.2 lb w/o eqt D/c weight: 178.2 lb Home weights: 180 lbs  VAD Indication: DT- age excluding    VAD interrogation & Equipment Management: Speed: 9200 Flow: 5.4 Power: 5.6 w    PI: 6.1  Alarms: one low speed with brief pump off on 04/30/17 at 10:30 pm Events: rare  Fixed speed 9200 Low speed limit: 8600  Primary controller battery expiration:  18 mos Secondary controller battery expiration:  18 mos  Pt presented to [facility] today with one brief pump stop noted on 04/30/17. Pt reported he "sat on drive line" and it was "folded in half" when alarm sounded.  He immediately got up and alarm stopped.  I tried to re-produce pump stop with drive line manipulation - no pump stops noted. Pt was advised if any pump stops occur in the future, he is to immediately place himself on batteries and page VAD pager. Pt verbalizes understanding of same. Event log sent to [employer] for review. Spoke with Bronson Curb, [employer] Clinical rep - she agreed with above plan. Will plan on re-checking event log at next visit. [name] updated - agrees with plan.    VAD speed increased per [name] for ongoing HF symptoms. Fixed speed to 9400 RPM with low speed limit 8800 RPM on primary and back up controller.    Exit Site Care: Drive line is being maintained twice weekly by VAD Coordinators and/or [name] (sister). Gauze dressing removed and site care performed using sterile technique. Drive line exit site cleaned with Chlora prep applicators x 2, rinsed with saline, allowed to dry, and sensitive skin dressing with Silverlon disc applied. Exit site healed and incorporated, the velour is fully implanted at exit site. Redness noted around exit site, chronic in nature. No Small amount brown  drainage noted on silver strip with slight foul odor noted. Have been cleaning with saline only, will try LVAD Skin sensitive kit and see if any improvement. Psoriasis rash noted around dressing site and under centurion foley attachment device. Pt reports the anchor is not staying on, "doesn't stick well". Will try the securement device in new kit with dual securement devices. Drive line anchor re-applied. Pt denies fever or chills.   Significant Events on VAD Support:  02/2017> GIB- AVM clipped x2  Device:Medtronic single lead Therapies: on at 231 bpm Last check: today  BP & Labs:  Doppler BP 118 - Doppler is reflecting modified systolic   Hgb 8.5 - No S/S of bleeding. Specifically denies melena/BRBPR; did have very small nosebleed x 1.  LDH stable at 287with established baseline of 225- 300. Denies tea-colored urine. No power elevations noted on interrogation.    Patient Instructions:  1.  We increased your LVAD speed to 9400 RPM. 2.  Continue Torsemide 40 mg daily with Metolazone 2.5 mg one time weekly on Wednesday. 3.  We will refer you to EP clinic for ICD follow up. 4.  Return to [facility] clinic in one week for dressing change and lab. Return in one month for visit with [name].   [name] RN VAD Coordinator   Office: [phone] 24/7 Emergency VAD Pager: [phone]     Trevor [name] Murphyis a 71 y.o.malewith a history of CAD s/p CABG x 4 2010, OSA, AS  with TAVR 2015, CKD, DM2, paroxysmal atrial fibrillation, asthma, and ischemic cardiomyopathy with Medtronic ICD.   Relocated to [place] from [place] in early 2018. Over the last couple of years, he had been admitted multiple times for CHF. Last admission in [place] was in 1/18. Sounds like the hospitalization was complicated by cardiorenal syndrome and there was consideration of doing dialysis.  He was admitted in to [facility]16 with NYHA class IV symptoms and hypotension. He was placed on dual  inotropes to facilitate diuresis, cardiac output and renal function.  CT surgery consulted for mechanical support and he was deemed appropriate for LVAD work up. He completed LVAD work up and was approved for HMII LVAD --> DT. On 12/11/16, he had IABP placed to optimize cardiac output and improve renal function. He then underwent HMII LVAD placement + tricuspid valve repair on 12/14/2015. Post operatively pressors weaned off slowly. He required significant diuresis.  Course was complicated by RV dysfunction and renal dysfunction.  Patient was admitted with lower GI bleeding in 4/18.  He had been on ASA 325 (increased with mild LDH elevation) and warfarin INR 2-2.5.  He had 4 units PRBCs total.  Colonoscopy showed colonic AVM treated with APC and clipping.  He was sent home off ASA and on warfarin INR goal 2-2.5.   Recently, creatinine was noted to increase as high as 3.67.  I cut back on his diuretics but he became volume overloaded.  I increased diuretics again with poor response.  In 5/18, he had RHC showing elevated right and left heart filling pressures and preserved cardiac output. I admitted him for IV diuresis.  Weight and creatinine came down, creatinine was 2.9 when discharged.  Ramp echo was done and speed was increased to 9200 rpm.  Sildenafil was increased to 40 mg tid.    Patient returns for followup today.  Weight is up about 8 lbs.  He says that he has been eating a lot.  Mildly dyspneic after walking 3 laps at cardiac rehab.  No orthopnea/PND.  Stable symptomatically.  No lightheadedness.  Hemoglobin has been stable, continues to get octreotide injection. MAP 92 today but has been in 80s at cardiac rehab.  Creatinine rose recently and I decreased his torsemide to 40 mg daily.   Labs (3/18): LDH 331 => 401 => 443 => 366 => 313, K 3.6 => 3.2 => 3.5, creatinine 1.98 => 1.74 => 1.8 => 1.79, hgb 8.3 => 8.9 => 8.7 => 8.4, INR 2.11 => 2.4 Labs (4/18): hgb 9.4 Labs (5/18): LDH 269 => 249, INR 2.57, K  4.5, creatinine 1.92 => 2.71 => 3.67 => 3.43 => 3.1 => 2.93, hgb 9, plts 104 Labs (6/18): K 4.5, creatinine 3.6 => 3.43, hgb 8.5, INR 2.4, LDH 287  PMH: 1. CAD: s/p CABG in 2010.  2. Aortic stenosis: s/p TAVR in 2015. Valve looked ok on 2/18 echo.  3. Atrial fibrillation: Chronic.  4. Type II diabetes.  5. CKD: Stage III-IV, cardiorenal syndrome.  6. Chronic systolic CHF: Ischemic cardiomyopathy with prominent RV dysfunction.  Medtronic ICD.  - Echo (2/18): EF 20-25%, moderately dilated LV, moderately dilated/moderately dysfunctional RV, severe TR.  - Cardiogenic shock 2/18 requiring milrinone, norepinephrine, and IABP.  - Heartmate II LVAD for DT placed 12/13/16.   - RHC (5/18): mean RA 16, PA 55/22 mean 33, mean PCWP 23, CI 2.59, PVR 3.87 7. OSA.  8. Thrombocytopenia: Post-op LVAD, resolved.  9. Tricuspid regurgitation: Severe, s/p repair with LVAD  placement in 2/18.  10. Lower GI bleed (4/18): Colonic AVM, treated with APC and clipping.   Social History   Social History  . Marital status: Widowed    Spouse name: N/A  . Number of children: 0  . Years of education: 14   Occupational History  . Not on file.   Social History Main Topics  . Smoking status: Never Smoker  . Smokeless tobacco: Never Used  . Alcohol use No  . Drug use: No  . Sexual activity: Not Currently   Other Topics Concern  . Not on file   Social History Narrative   Patient is a widow. Moved to [place] from [place], [place]. Currently lives with his sister Aquil Duhe.    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    Family History  Problem Relation Age of Onset  . Heart failure Father   . Heart attack Father   . Healthy Mother   . Diabetes Paternal Grandfather    ROS: All systems reviewed and negative except as per HPI.   Current Outpatient Prescriptions  Medication Sig Dispense Refill  . amLODipine (NORVASC) 5 MG tablet Take 1 tablet (5 mg total) by mouth daily. 30 tablet 6  .  ascorbic acid (VITAMIN C) 1000 MG tablet Take 1,000 mg by mouth daily at 12 noon.     Marland Kitchen atorvastatin (LIPITOR) 40 MG tablet Take 1 tablet (40 mg total) by mouth daily. 30 tablet 6  . busPIRone (BUSPAR) 5 MG tablet Take 1 tablet (5 mg total) by mouth 2 (two) times daily. (Patient taking differently: Take 5 mg by mouth 2 (two) times daily. Morning & bedtime) 60 tablet 6  . docusate sodium (COLACE) 100 MG capsule Take 1 capsule (100 mg total) by mouth 2 (two) times daily. 60 capsule 12  . fluticasone (FLOVENT HFA) 110 MCG/ACT inhaler Inhale 2 puffs into the lungs 2 (two) times daily. (Patient taking differently: Inhale 2 puffs into the lungs 2 (two) times daily. MORNING & BEDTIME) 1 Inhaler 5  . gabapentin (NEURONTIN) 600 MG tablet Take 0.5 tablets (300 mg total) by mouth 2 (two) times daily. 60 tablet 6  . insulin glargine (LANTUS) 100 unit/mL SOPN Inject 0.2 mLs (20 Units total) into the skin at bedtime. 15 mL 0  . levothyroxine (SYNTHROID, LEVOTHROID) 25 MCG tablet Take 1 tablet (25 mcg total) by mouth daily before breakfast. 30 tablet 6  . metolazone (ZAROXOLYN) 2.5 MG tablet Take 1 tablet (2.5 mg total) by mouth once a week. 10 tablet 3  . Multiple Vitamin (MULTIVITAMIN WITH MINERALS) TABS tablet Take 1 tablet by mouth daily at 12 noon.    Marland Kitchen octreotide (SANDOSTATIN LAR) 20 MG injection Inject 20 mg into the muscle every 28 (twenty-eight) days. 1 each 2  . pantoprazole (PROTONIX) 40 MG tablet Take 1 tablet (40 mg total) by mouth daily at 12 noon. 30 tablet 6  . potassium chloride SA (K-DUR,KLOR-CON) 20 MEQ tablet TAKE 2 TABLETS BY MOUTH DAILY WITH AN ADDITIONAL 1 TABLET ONCE WEEKLY ON METOLAZONE DAYS 182 tablet 3  . sildenafil (REVATIO) 20 MG tablet Take 2 tablets (40 mg total) by mouth 3 (three) times daily. 180 tablet 6  . torsemide (DEMADEX) 20 MG tablet Take 2 tablets (40 mg total) by mouth daily. 90 tablet 5  . traMADol (ULTRAM) 50 MG tablet TAKE 1 TABLET BY MOUTH EVERY 6 HOURS AS NEEDED FOR  MODERATE PAIN 30 tablet 2  . traZODone (DESYREL) 50 MG tablet TAKE  1 TABLET(50 MG) BY MOUTH AT BEDTIME AS NEEDED FOR SLEEP 30 tablet 3  . warfarin (COUMADIN) 5 MG tablet Take 1.5 tablets (7.5 mg total) by mouth daily. 45 tablet 6  . albuterol (PROVENTIL HFA;VENTOLIN HFA) 108 (90 Base) MCG/ACT inhaler Inhale 2 puffs into the lungs every 4 (four) hours as needed for wheezing or shortness of breath. (Patient not taking: Reported on 05/01/2017) 1 Inhaler 5  . albuterol (PROVENTIL) (2.5 MG/3ML) 0.083% nebulizer solution Take 3 mLs (2.5 mg total) by nebulization every 4 (four) hours. And as needed (Patient not taking: Reported on 05/01/2017) 150 mL 5  . benzonatate (TESSALON) 100 MG capsule Take 1 capsule (100 mg total) by mouth 3 (three) times daily as needed for cough. (Patient not taking: Reported on 05/01/2017) 20 capsule 6   No current facility-administered medications for this encounter.    BP (!) 118/0 Comment: Doppler modified systolic - pt has LVAD  Pulse 70   Ht 5' 5"  (1.651 m)   Wt 186 lb 3.2 oz (84.5 kg)   SpO2 99%   BMI 30.99 kg/m   MAP 82 General: NAD Neck: JVP 10 cm, no thyromegaly or thyroid nodule.  Lungs: CTAB  CV: LVAD hum. 1+ edema to knees bilaterally.  No carotid bruit.   Abdomen: Soft, nontender, no hepatosplenomegaly, no distention.  Skin: Intact without lesions or rashes.  Neurologic: Alert and oriented x 3.  Psych: Normal affect. Extremities: No clubbing or cyanosis.  HEENT: Normal.   Assessment/Plan: 1. Chronic systolic CHF: Ischemic cardiomyopathy, EF 20-25% with RV dysfunction on 2/18 echo pre-LVAD.  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  Medtronic ICD.  He has felt much better post-LVAD, NYHA class II.  However, recently developed AKI, eventually admitted in 5/18 after RHC showed elevated filling pressures.  He was diuresed and creatinine came down. Creatinine rose again recently and torsemide decreased to 40 mg daily.  Today, he is volume overloaded with weight up (not  markedly overloaded).  Creatinine 3.43 most recently.   - Difficult balance between volume and renal function.  I will continue torsemide 40 mg daily with once weekly metolazone, will increase speed of LVAD to 9400 rpm (had looked ok at this speed on prior ramp echo).  Cut back on dietary sodium.  - Continue warfarin, no ASA with recent GI bleeding.  - Continue cardiac rehab.     2. CAD: s/p CABG.  No chest pain.  He is on atorvastatin.  3. CKD stage III:  Creatinine continues to fluctuate up and down.  Creatinine down a bit to 3.43 with torsemide adjustment.    4. Atrial fibrillation: Chronic.  On warfarin.  5. RV failure: He is on Revatio, now increased to 40 mg tid.   6. HTN: MAP controlled.    7. Anemia: Stable, baseline anemia of renal disease/chronic disease but recent bleeding from colonic AVMs. CBC today.  8. 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  - Continue octreotide injections.   Loralie Champagne 05/02/2017

## 2017-05-01 NOTE — Addendum Note (Signed)
Encounter addended by: Lezlie Octave, RN on: 05/01/2017 12:23 PM<BR>    Actions taken: Vitals modified, Order Reconciliation Section accessed, Home Medications modified, Medication taking status modified

## 2017-05-02 ENCOUNTER — Encounter (HOSPITAL_COMMUNITY)
Admission: RE | Admit: 2017-05-02 | Discharge: 2017-05-02 | Disposition: A | Discharge status: Home / Self Care | Payer: Medicare Other | Source: Ambulatory Visit | Attending: Cardiology | Admitting: Cardiology

## 2017-05-02 ENCOUNTER — Telehealth (HOSPITAL_COMMUNITY): Payer: Self-pay | Admitting: Pharmacist

## 2017-05-02 ENCOUNTER — Other Ambulatory Visit (HOSPITAL_COMMUNITY): Payer: Self-pay

## 2017-05-02 DIAGNOSIS — Z95811 Presence of heart assist device: Secondary | ICD-10-CM

## 2017-05-02 DIAGNOSIS — I5022 Chronic systolic (congestive) heart failure: Secondary | ICD-10-CM

## 2017-05-02 NOTE — Telephone Encounter (Signed)
Set appointment for Mr. Kirkeby's July Octreotide dose. Left VM with appointment information.   Ruta Hinds. Velva Harman, PharmD, BCPS, CPP Clinical Pharmacist Pager: [phone] Phone: [phone] 05/02/2017 11:16 AM

## 2017-05-02 NOTE — Addendum Note (Signed)
Encounter addended by: Larey Dresser, MD on: 05/02/2017 11:40 AM<BR>    Actions taken: Charge Capture section accepted, Sign clinical note, LOS modified

## 2017-05-02 NOTE — Progress Notes (Signed)
Trevor [name] 71 y.o. male Nutrition Note Spoke with pt. Nutrition Plan and Nutrition Survey goals reviewed with pt. Pt is following Step 1 of the Therapeutic Lifestyle Changes diet. Pt wants to lose wt. Pt has gained 8.1 lb (4.6% increase) since admission. Pt reports wt gain due to "eating too much pasta that people are bringing over since my sister's surgery. Pt wants to lose wt. Pt reports he weighed 240 lb about 1.5 years ago. Per EMR, pt's wt is down ~20 lb over the past 5 months (9.8% decrease). Wt loss tips reviewed. Pt is diabetic. Last A1c indicates blood glucose well-controlled. Pt checks CBG's 2 times a day. Fasting CBG's reportedly 130-160 mg/dL, which pt states is "a little higher than normal for me." Pt with dx of CHF. Per discussion, pt is aware of the need to watch his sodium intake. Pt is on Coumadin and denies difficulty following a diet with consistent vitamin K intake. Pt expressed understanding of the information reviewed. Pt aware of nutrition education classes offered.  Lab Results  Component Value Date   HGBA1C 6.7 (H) 03/17/2017   Wt Readings from Last 3 Encounters:  05/01/17 186 lb 3.2 oz (84.5 kg)  04/10/17 174 lb (78.9 kg)  04/03/17 178 lb 3.2 oz (80.8 kg)    Nutrition Diagnosis ? Food-and nutrition-related knowledge deficit related to lack of exposure to information as related to diagnosis of: ? CVD ? DM  ? Overweight related to excessive energy intake as evidenced by a BMI of 29.7  Nutrition Intervention ? Pt's individual nutrition plan reviewed with pt. ? Benefits of adopting Therapeutic Lifestyle Changes discussed when Medficts reviewed.   ? Continue client-centered nutrition education by RD, as part of interdisciplinary care. Goal(s) ? Pt to identify food quantities necessary to achieve weight loss of 6-24 lb (2.7-10.9 kg) at graduation from cardiac rehab.   Derek Mound, M.Ed, RD, LDN, CDE 05/02/2017 11:19 AM

## 2017-05-05 ENCOUNTER — Encounter (HOSPITAL_COMMUNITY): Admission: RE | Admit: 2017-05-05 | Payer: Medicare Other | Source: Ambulatory Visit

## 2017-05-05 ENCOUNTER — Encounter (HOSPITAL_COMMUNITY)
Admission: RE | Admit: 2017-05-05 | Discharge: 2017-05-05 | Disposition: A | Discharge status: Home / Self Care | Payer: Medicare Other | Source: Ambulatory Visit | Attending: Cardiology | Admitting: Cardiology

## 2017-05-05 DIAGNOSIS — Z95811 Presence of heart assist device: Secondary | ICD-10-CM | POA: Diagnosis present

## 2017-05-05 DIAGNOSIS — I5022 Chronic systolic (congestive) heart failure: Secondary | ICD-10-CM

## 2017-05-05 LAB — GLUCOSE, CAPILLARY: GLUCOSE-CAPILLARY: 102 mg/dL — AB (ref 65–99)

## 2017-05-05 NOTE — Progress Notes (Signed)
Reviewed home exercise program with pt.  Discussed mode and frequency of exercise, target heart rate range and weather conditions for exercising outdoors.  Also discussed signs and symptoms and when to call Dr./911.  Pt verbalized understanding.  Cleda Mccreedy, [place] ACSM RCEP 05/05/2017 1606

## 2017-05-06 ENCOUNTER — Encounter (HOSPITAL_COMMUNITY): Payer: Medicare Other

## 2017-05-08 ENCOUNTER — Other Ambulatory Visit (HOSPITAL_COMMUNITY): Payer: Self-pay | Admitting: *Deleted

## 2017-05-08 ENCOUNTER — Ambulatory Visit (HOSPITAL_COMMUNITY)
Admission: RE | Admit: 2017-05-08 | Discharge: 2017-05-08 | Disposition: A | Discharge status: Home / Self Care | Payer: Medicare Other | Source: Ambulatory Visit | Attending: Internal Medicine | Admitting: Internal Medicine

## 2017-05-08 ENCOUNTER — Ambulatory Visit (HOSPITAL_COMMUNITY)
Admission: RE | Admit: 2017-05-08 | Discharge: 2017-05-08 | Disposition: A | Discharge status: Home / Self Care | Payer: Medicare Other | Source: Ambulatory Visit | Attending: Cardiology | Admitting: Cardiology

## 2017-05-08 ENCOUNTER — Ambulatory Visit (HOSPITAL_COMMUNITY): Payer: Self-pay | Admitting: Pharmacist

## 2017-05-08 DIAGNOSIS — I130 Hypertensive heart and chronic kidney disease with heart failure and stage 1 through stage 4 chronic kidney disease, or unspecified chronic kidney disease: Secondary | ICD-10-CM | POA: Diagnosis not present

## 2017-05-08 DIAGNOSIS — Z95811 Presence of heart assist device: Secondary | ICD-10-CM | POA: Diagnosis present

## 2017-05-08 DIAGNOSIS — Z951 Presence of aortocoronary bypass graft: Secondary | ICD-10-CM | POA: Diagnosis not present

## 2017-05-08 DIAGNOSIS — Z7901 Long term (current) use of anticoagulants: Secondary | ICD-10-CM

## 2017-05-08 DIAGNOSIS — I4891 Unspecified atrial fibrillation: Secondary | ICD-10-CM | POA: Insufficient documentation

## 2017-05-08 DIAGNOSIS — I5022 Chronic systolic (congestive) heart failure: Secondary | ICD-10-CM | POA: Diagnosis not present

## 2017-05-08 DIAGNOSIS — N183 Chronic kidney disease, stage 3 (moderate): Secondary | ICD-10-CM | POA: Insufficient documentation

## 2017-05-08 DIAGNOSIS — D500 Iron deficiency anemia secondary to blood loss (chronic): Secondary | ICD-10-CM | POA: Insufficient documentation

## 2017-05-08 DIAGNOSIS — I2510 Atherosclerotic heart disease of native coronary artery without angina pectoris: Secondary | ICD-10-CM | POA: Insufficient documentation

## 2017-05-08 LAB — BASIC METABOLIC PANEL
ANION GAP: 11 (ref 5–15)
BUN: 57 mg/dL — ABNORMAL HIGH (ref 6–20)
CO2: 27 mmol/L (ref 22–32)
Calcium: 8.9 mg/dL (ref 8.9–10.3)
Chloride: 96 mmol/L — ABNORMAL LOW (ref 101–111)
Creatinine, Ser: 3.47 mg/dL — ABNORMAL HIGH (ref 0.61–1.24)
GFR calc Af Amer: 19 mL/min — ABNORMAL LOW (ref >60)
GFR, EST NON AFRICAN AMERICAN: 16 mL/min — AB (ref >60)
GLUCOSE: 193 mg/dL — AB (ref 65–99)
Potassium: 4.1 mmol/L (ref 3.5–5.1)
Sodium: 134 mmol/L — ABNORMAL LOW (ref 135–145)

## 2017-05-08 LAB — PROTIME-INR
INR: 2.55
Prothrombin Time: 27.9 seconds — ABNORMAL HIGH (ref 11.4–15.2)

## 2017-05-08 MED ORDER — OCTREOTIDE ACETATE 20 MG IM KIT
20.0000 mg | PACK | Freq: Once | INTRAMUSCULAR | Status: AC
  Administered 2017-05-08: 12:00:00 20 mg via INTRAMUSCULAR
  Filled 2017-05-08 (×2): qty 1

## 2017-05-08 NOTE — Patient Instructions (Signed)
1. Return to [facility] clinic in one week for dressing change and lab.

## 2017-05-08 NOTE — Progress Notes (Signed)
Patient presents for one week dressing change, labs, and VAD interrogation. Reports no problems with VAD equipment or concerns with drive line.   Weight: 184.4 lb w/o eqt Last wt: 186.2 lb w/o eqt  VAD Indication: DT- age excluding    VAD interrogation & Equipment Management: Speed: 9400 Flow: 6.0 Power: 6.2w    PI: 5.4  Alarms: none Events: rare  Fixed speed 9400 Low speed limit: 8800  Primary controller battery expiration:  18 mos Secondary controller battery expiration:  18 mos  Event log downloaded and sent to [employer] for review for f/u from last clinic visit when pt had one brief pump stop when he sat on drive line.    Exit Site Care: Drive line is being once weekly by VAD Coordinators. Caregiver [name] -sister) is currently unavailable. Gentle window dressing not sticking, pt had taped down one edge; dressing removed and site care performed using sterile technique. Drive line exit site cleaned with Chlora prep applicators x 2, rinsed with saline, allowed to dry, and Silverlon disc applied, topped with antimicrobial sponge and gentle window dressing. Skin protectant applied before placement. Dressing then covered with large Tegaderm dressing. Exit site healed and incorporated, the velour is fully implanted at exit site. Redness noted around exit site, chronic in nature. Small amount brown drainage noted with no tenderness or foul odor noted. Psoriasis rash noted around dressing site and under foley anchor attachment device. Pt reports the new dual securement device stayed on for 6 days, "came off yesterday"; he replaced with centurion foley anchor. Dual anchor attachment device re-applied. Pt denies fever or chills.   Pt reports he saw Dr. Camillo Flaming, Dermatology this week and she needs to speak with [name] about safe drugs to treat psoriasis and also she discovered a "skin cancer" on left side of neck that needs to be biopsied and removed "as soon as possible". Dr.  Aundra Dubin in room to evaluate and asked that VAD coordinator contact dermatology office and give his direct contact info.  Dr. Margreta Journey [address] [place], [place]  Off: [phone] Fax: [phone]  Contacted dermatology office and gave Dr. Claris Gladden cell phone # for Dr. Renda Rolls.    Patient Instructions:  1. Return to [facility] clinic in one week for dressing change and lab.   [name] RN [place] Coordinator   Office: [phone] 24/7 Emergency VAD Pager: [phone]

## 2017-05-09 ENCOUNTER — Encounter (HOSPITAL_COMMUNITY)
Admission: RE | Admit: 2017-05-09 | Discharge: 2017-05-09 | Disposition: A | Discharge status: Home / Self Care | Payer: Medicare Other | Source: Ambulatory Visit | Attending: Cardiology | Admitting: Cardiology

## 2017-05-09 DIAGNOSIS — Z95811 Presence of heart assist device: Secondary | ICD-10-CM

## 2017-05-09 DIAGNOSIS — I5022 Chronic systolic (congestive) heart failure: Secondary | ICD-10-CM

## 2017-05-11 ENCOUNTER — Other Ambulatory Visit (HOSPITAL_COMMUNITY): Payer: Self-pay | Admitting: Adult Health

## 2017-05-12 ENCOUNTER — Encounter (HOSPITAL_COMMUNITY)
Admission: RE | Admit: 2017-05-12 | Discharge: 2017-05-12 | Disposition: A | Discharge status: Home / Self Care | Payer: Medicare Other | Source: Ambulatory Visit | Attending: Cardiology | Admitting: Cardiology

## 2017-05-12 DIAGNOSIS — Z95811 Presence of heart assist device: Secondary | ICD-10-CM | POA: Diagnosis not present

## 2017-05-12 DIAGNOSIS — I5022 Chronic systolic (congestive) heart failure: Secondary | ICD-10-CM

## 2017-05-14 ENCOUNTER — Encounter (HOSPITAL_COMMUNITY)
Admission: RE | Admit: 2017-05-14 | Discharge: 2017-05-14 | Disposition: A | Discharge status: Home / Self Care | Payer: Medicare Other | Source: Ambulatory Visit | Attending: Cardiology | Admitting: Cardiology

## 2017-05-14 DIAGNOSIS — I5022 Chronic systolic (congestive) heart failure: Secondary | ICD-10-CM

## 2017-05-14 DIAGNOSIS — Z95811 Presence of heart assist device: Secondary | ICD-10-CM | POA: Diagnosis not present

## 2017-05-15 ENCOUNTER — Other Ambulatory Visit (HOSPITAL_COMMUNITY): Payer: Self-pay | Admitting: *Deleted

## 2017-05-15 ENCOUNTER — Other Ambulatory Visit (HOSPITAL_COMMUNITY): Payer: Self-pay | Admitting: Pharmacist

## 2017-05-15 ENCOUNTER — Ambulatory Visit (HOSPITAL_COMMUNITY): Payer: Self-pay | Admitting: Pharmacist

## 2017-05-15 ENCOUNTER — Ambulatory Visit (HOSPITAL_COMMUNITY)
Admission: RE | Admit: 2017-05-15 | Discharge: 2017-05-15 | Disposition: A | Discharge status: Home / Self Care | Payer: Medicare Other | Source: Ambulatory Visit | Attending: Internal Medicine | Admitting: Internal Medicine

## 2017-05-15 DIAGNOSIS — N184 Chronic kidney disease, stage 4 (severe): Secondary | ICD-10-CM

## 2017-05-15 DIAGNOSIS — Z95811 Presence of heart assist device: Secondary | ICD-10-CM | POA: Diagnosis not present

## 2017-05-15 DIAGNOSIS — Z7901 Long term (current) use of anticoagulants: Secondary | ICD-10-CM

## 2017-05-15 DIAGNOSIS — N185 Chronic kidney disease, stage 5: Secondary | ICD-10-CM

## 2017-05-15 LAB — BASIC METABOLIC PANEL
ANION GAP: 12 (ref 5–15)
BUN: 55 mg/dL — AB (ref 6–20)
CALCIUM: 8.7 mg/dL — AB (ref 8.9–10.3)
CO2: 26 mmol/L (ref 22–32)
Chloride: 95 mmol/L — ABNORMAL LOW (ref 101–111)
Creatinine, Ser: 3.52 mg/dL — ABNORMAL HIGH (ref 0.61–1.24)
GFR calc Af Amer: 19 mL/min — ABNORMAL LOW (ref >60)
GFR, EST NON AFRICAN AMERICAN: 16 mL/min — AB (ref >60)
Glucose, Bld: 188 mg/dL — ABNORMAL HIGH (ref 65–99)
POTASSIUM: 4.5 mmol/L (ref 3.5–5.1)
SODIUM: 133 mmol/L — AB (ref 135–145)

## 2017-05-15 LAB — PROTIME-INR
INR: 2.72
PROTHROMBIN TIME: 29.4 s — AB (ref 11.4–15.2)

## 2017-05-15 MED ORDER — WARFARIN SODIUM 5 MG PO TABS: ORAL_TABLET | ORAL | 6 refills | Status: DC

## 2017-05-16 ENCOUNTER — Encounter (HOSPITAL_COMMUNITY)
Admission: RE | Admit: 2017-05-16 | Discharge: 2017-05-16 | Disposition: A | Discharge status: Home / Self Care | Payer: Medicare Other | Source: Ambulatory Visit | Attending: Cardiology | Admitting: Cardiology

## 2017-05-16 DIAGNOSIS — Z95811 Presence of heart assist device: Secondary | ICD-10-CM

## 2017-05-16 DIAGNOSIS — I5022 Chronic systolic (congestive) heart failure: Secondary | ICD-10-CM

## 2017-05-19 ENCOUNTER — Ambulatory Visit (HOSPITAL_COMMUNITY)
Admission: RE | Admit: 2017-05-19 | Discharge: 2017-05-19 | Disposition: A | Discharge status: Home / Self Care | Payer: Medicare Other | Source: Ambulatory Visit | Attending: Family | Admitting: Family

## 2017-05-19 ENCOUNTER — Encounter (HOSPITAL_COMMUNITY)
Admission: RE | Admit: 2017-05-19 | Discharge: 2017-05-19 | Disposition: A | Discharge status: Home / Self Care | Payer: Medicare Other | Source: Ambulatory Visit | Attending: Cardiology | Admitting: Cardiology

## 2017-05-19 ENCOUNTER — Other Ambulatory Visit: Payer: Self-pay

## 2017-05-19 ENCOUNTER — Telehealth (HOSPITAL_COMMUNITY): Payer: Self-pay | Admitting: *Deleted

## 2017-05-19 ENCOUNTER — Ambulatory Visit (HOSPITAL_COMMUNITY)
Admission: RE | Admit: 2017-05-19 | Discharge: 2017-05-19 | Disposition: A | Discharge status: Home / Self Care | Payer: Medicare Other | Source: Ambulatory Visit | Attending: Cardiology | Admitting: Cardiology

## 2017-05-19 ENCOUNTER — Ambulatory Visit (HOSPITAL_COMMUNITY): Payer: Self-pay | Admitting: Pharmacist

## 2017-05-19 VITALS — BP 119/68 | HR 72 | Ht 65.0 in | Wt 183.6 lb

## 2017-05-19 DIAGNOSIS — Z8249 Family history of ischemic heart disease and other diseases of the circulatory system: Secondary | ICD-10-CM | POA: Insufficient documentation

## 2017-05-19 DIAGNOSIS — L409 Psoriasis, unspecified: Secondary | ICD-10-CM | POA: Diagnosis not present

## 2017-05-19 DIAGNOSIS — N184 Chronic kidney disease, stage 4 (severe): Secondary | ICD-10-CM | POA: Diagnosis not present

## 2017-05-19 DIAGNOSIS — Z95811 Presence of heart assist device: Secondary | ICD-10-CM

## 2017-05-19 DIAGNOSIS — E1122 Type 2 diabetes mellitus with diabetic chronic kidney disease: Secondary | ICD-10-CM | POA: Diagnosis not present

## 2017-05-19 DIAGNOSIS — I130 Hypertensive heart and chronic kidney disease with heart failure and stage 1 through stage 4 chronic kidney disease, or unspecified chronic kidney disease: Secondary | ICD-10-CM | POA: Insufficient documentation

## 2017-05-19 DIAGNOSIS — D638 Anemia in other chronic diseases classified elsewhere: Secondary | ICD-10-CM | POA: Diagnosis not present

## 2017-05-19 DIAGNOSIS — I5042 Chronic combined systolic (congestive) and diastolic (congestive) heart failure: Secondary | ICD-10-CM

## 2017-05-19 DIAGNOSIS — Z951 Presence of aortocoronary bypass graft: Secondary | ICD-10-CM | POA: Insufficient documentation

## 2017-05-19 DIAGNOSIS — N183 Chronic kidney disease, stage 3 (moderate): Secondary | ICD-10-CM | POA: Diagnosis not present

## 2017-05-19 DIAGNOSIS — Z833 Family history of diabetes mellitus: Secondary | ICD-10-CM | POA: Diagnosis not present

## 2017-05-19 DIAGNOSIS — I50810 Right heart failure, unspecified: Secondary | ICD-10-CM

## 2017-05-19 DIAGNOSIS — Z7901 Long term (current) use of anticoagulants: Secondary | ICD-10-CM | POA: Insufficient documentation

## 2017-05-19 DIAGNOSIS — I5022 Chronic systolic (congestive) heart failure: Secondary | ICD-10-CM | POA: Insufficient documentation

## 2017-05-19 DIAGNOSIS — D631 Anemia in chronic kidney disease: Secondary | ICD-10-CM | POA: Insufficient documentation

## 2017-05-19 DIAGNOSIS — I255 Ischemic cardiomyopathy: Secondary | ICD-10-CM | POA: Insufficient documentation

## 2017-05-19 DIAGNOSIS — Z794 Long term (current) use of insulin: Secondary | ICD-10-CM | POA: Diagnosis not present

## 2017-05-19 DIAGNOSIS — I482 Chronic atrial fibrillation: Secondary | ICD-10-CM | POA: Insufficient documentation

## 2017-05-19 DIAGNOSIS — I2510 Atherosclerotic heart disease of native coronary artery without angina pectoris: Secondary | ICD-10-CM | POA: Insufficient documentation

## 2017-05-19 DIAGNOSIS — I509 Heart failure, unspecified: Secondary | ICD-10-CM | POA: Diagnosis not present

## 2017-05-19 DIAGNOSIS — Z79899 Other long term (current) drug therapy: Secondary | ICD-10-CM | POA: Diagnosis not present

## 2017-05-19 DIAGNOSIS — G4733 Obstructive sleep apnea (adult) (pediatric): Secondary | ICD-10-CM | POA: Insufficient documentation

## 2017-05-19 LAB — COMPREHENSIVE METABOLIC PANEL
ALBUMIN: 4.1 g/dL (ref 3.5–5.0)
ALK PHOS: 101 U/L (ref 38–126)
ALT: 24 U/L (ref 17–63)
ANION GAP: 11 (ref 5–15)
AST: 24 U/L (ref 15–41)
BUN: 59 mg/dL — ABNORMAL HIGH (ref 6–20)
CALCIUM: 8.9 mg/dL (ref 8.9–10.3)
CO2: 27 mmol/L (ref 22–32)
Chloride: 96 mmol/L — ABNORMAL LOW (ref 101–111)
Creatinine, Ser: 3.52 mg/dL — ABNORMAL HIGH (ref 0.61–1.24)
GFR calc Af Amer: 19 mL/min — ABNORMAL LOW (ref >60)
GFR calc non Af Amer: 16 mL/min — ABNORMAL LOW (ref >60)
GLUCOSE: 157 mg/dL — AB (ref 65–99)
Potassium: 4 mmol/L (ref 3.5–5.1)
SODIUM: 134 mmol/L — AB (ref 135–145)
Total Bilirubin: 1.1 mg/dL (ref 0.3–1.2)
Total Protein: 7.8 g/dL (ref 6.5–8.1)

## 2017-05-19 LAB — CBC
HCT: 23.6 % — ABNORMAL LOW (ref 39.0–52.0)
HEMOGLOBIN: 7.9 g/dL — AB (ref 13.0–17.0)
MCH: 30.9 pg (ref 26.0–34.0)
MCHC: 33.5 g/dL (ref 30.0–36.0)
MCV: 92.2 fL (ref 78.0–100.0)
Platelets: 120 10*3/uL — ABNORMAL LOW (ref 150–400)
RBC: 2.56 MIL/uL — ABNORMAL LOW (ref 4.22–5.81)
RDW: 15.4 % (ref 11.5–15.5)
WBC: 8.6 10*3/uL (ref 4.0–10.5)

## 2017-05-19 LAB — PROTIME-INR
INR: 2.64
PROTHROMBIN TIME: 28.7 s — AB (ref 11.4–15.2)

## 2017-05-19 LAB — LACTATE DEHYDROGENASE: LDH: 274 U/L — ABNORMAL HIGH (ref 98–192)

## 2017-05-19 NOTE — Progress Notes (Signed)
Cardiac Individual Treatment Plan  Patient Details  Name: Trevor [name] MRN: [id number] Date of Birth: 1946/10/31 Referring Provider:     CARDIAC REHAB PHASE II ORIENTATION from 04/03/2017 in [place]  Referring Provider  Loralie Champagne MD      Initial Encounter Date:    CARDIAC REHAB PHASE II ORIENTATION from 04/03/2017 in [place]  Date  04/03/17  Referring Provider  Loralie Champagne MD      Visit Diagnosis: Heart failure, chronic systolic ([place])  05/10/27 LVAD (left ventricular assist device) present [facility])  Patient's Home Medications on Admission:  Current Outpatient Prescriptions:  .  albuterol (PROVENTIL HFA;VENTOLIN HFA) 108 (90 Base) MCG/ACT inhaler, Inhale 2 puffs into the lungs every 4 (four) hours as needed for wheezing or shortness of breath., Disp: 1 Inhaler, Rfl: 5 .  albuterol (PROVENTIL) (2.5 MG/3ML) 0.083% nebulizer solution, Take 3 mLs (2.5 mg total) by nebulization every 4 (four) hours. And as needed (Patient not taking: Reported on 05/19/2017), Disp: 150 mL, Rfl: 5 .  amLODipine (NORVASC) 5 MG tablet, Take 1 tablet (5 mg total) by mouth daily., Disp: 30 tablet, Rfl: 6 .  ascorbic acid (VITAMIN C) 1000 MG tablet, Take 1,000 mg by mouth daily at 12 noon. , Disp: , Rfl:  .  atorvastatin (LIPITOR) 40 MG tablet, Take 1 tablet (40 mg total) by mouth daily., Disp: 30 tablet, Rfl: 6 .  benzonatate (TESSALON) 100 MG capsule, Take 1 capsule (100 mg total) by mouth 3 (three) times daily as needed for cough., Disp: 20 capsule, Rfl: 6 .  busPIRone (BUSPAR) 5 MG tablet, Take 1 tablet (5 mg total) by mouth 2 (two) times daily., Disp: 60 tablet, Rfl: 6 .  docusate sodium (COLACE) 100 MG capsule, Take 1 capsule (100 mg total) by mouth 2 (two) times daily., Disp: 60 capsule, Rfl: 12 .  fluticasone (FLOVENT HFA) 110 MCG/ACT inhaler, Inhale 2 puffs into the lungs 2 (two) times daily., Disp: 1 Inhaler, Rfl: 5 .  gabapentin  (NEURONTIN) 600 MG tablet, Take 0.5 tablets (300 mg total) by mouth 2 (two) times daily., Disp: 60 tablet, Rfl: 6 .  insulin glargine (LANTUS) 100 unit/mL SOPN, Inject 0.2 mLs (20 Units total) into the skin at bedtime., Disp: 15 mL, Rfl: 0 .  levothyroxine (SYNTHROID, LEVOTHROID) 25 MCG tablet, Take 1 tablet (25 mcg total) by mouth daily before breakfast., Disp: 30 tablet, Rfl: 6 .  metolazone (ZAROXOLYN) 2.5 MG tablet, Take 1 tablet (2.5 mg total) by mouth once a week., Disp: 10 tablet, Rfl: 3 .  Multiple Vitamin (MULTIVITAMIN WITH MINERALS) TABS tablet, Take 1 tablet by mouth daily at 12 noon., Disp: , Rfl:  .  octreotide (SANDOSTATIN LAR) 20 MG injection, Inject 20 mg into the muscle every 28 (twenty-eight) days., Disp: 1 each, Rfl: 2 .  pantoprazole (PROTONIX) 40 MG tablet, Take 1 tablet (40 mg total) by mouth daily at 12 noon., Disp: 30 tablet, Rfl: 6 .  potassium chloride SA (K-DUR,KLOR-CON) 20 MEQ tablet, TAKE 2 TABLETS BY MOUTH DAILY WITH AN ADDITIONAL 1 TABLET ONCE WEEKLY ON METOLAZONE DAYS, Disp: 182 tablet, Rfl: 3 .  sildenafil (REVATIO) 20 MG tablet, Take 2 tablets (40 mg total) by mouth 3 (three) times daily., Disp: 180 tablet, Rfl: 6 .  torsemide (DEMADEX) 20 MG tablet, Take 2 tablets (40 mg total) by mouth daily., Disp: 90 tablet, Rfl: 5 .  traMADol (ULTRAM) 50 MG tablet, TAKE 1 TABLET BY MOUTH EVERY  6 HOURS AS NEEDED FOR MODERATE PAIN, Disp: 30 tablet, Rfl: 2 .  traZODone (DESYREL) 50 MG tablet, TAKE 1 TABLET(50 MG) BY MOUTH AT BEDTIME AS NEEDED FOR SLEEP, Disp: 30 tablet, Rfl: 0 .  warfarin (COUMADIN) 5 MG tablet, Take 7.5 mg (1 and 1/2 tablets) daily except 5 mg (1 tablet) on Tues and Thurs., Disp: , Rfl:   Past Medical History: Past Medical History:  Diagnosis Date  . AICD (automatic cardioverter/defibrillator) present   . Asthma   . CHF (congestive heart failure) ([place])   . Diabetes ([place])   . Kidney disease   . Presence of permanent cardiac pacemaker    AICD  . Sleep apnea      Tobacco Use: History  Smoking Status  . Never Smoker  Smokeless Tobacco  . Never Used    Labs: Recent Review Flowsheet Data    Labs for ITP Cardiac and Pulmonary Rehab Latest Ref Rng & Units 12/31/2016 01/01/2017 03/17/2017 03/27/2017 03/27/2017   Cholestrol 0 - 200 mg/dL - - - - -   LDLCALC 0 - 99 mg/dL - - - - -   HDL >40 mg/dL - - - - -   Trlycerides <150 mg/dL - - - - -   Hemoglobin A1c 4.8 - 5.6 % - - 6.7(H) - -   PHART 7.350 - 7.450 - - - - -   PCO2ART 32.0 - 48.0 mmHg - - - - -   HCO3 20.0 - 28.0 mmol/L - - - 34.4(H) 35.2(H)   TCO2 0 - 100 mmol/L - - - 36 37   O2SAT % 60.9 59.0 - 53.0 53.0      Capillary Blood Glucose: Lab Results  Component Value Date   GLUCAP 102 (H) 05/05/2017   GLUCAP 109 (H) 04/30/2017   GLUCAP 170 (H) 04/18/2017   GLUCAP 116 (H) 04/14/2017   GLUCAP 165 (H) 04/14/2017     Exercise Target Goals:    Exercise Program Goal: Individual exercise prescription set with THRR, safety & activity barriers. Participant demonstrates ability to understand and report RPE using BORG scale, to self-measure pulse accurately, and to acknowledge the importance of the exercise prescription.  Exercise Prescription Goal: Starting with aerobic activity 30 plus minutes a day, 3 days per week for initial exercise prescription. Provide home exercise prescription and guidelines that participant acknowledges understanding prior to discharge.  Activity Barriers & Risk Stratification:     Activity Barriers & Cardiac Risk Stratification - 04/03/17 0832      Activity Barriers & Cardiac Risk Stratification   Activity Barriers Arthritis;Deconditioning;Muscular Weakness;Shortness of Breath   Cardiac Risk Stratification High      6 Minute Walk:     6 Minute Walk    Row Name 04/03/17 1611 04/03/17 1618       6 Minute Walk   Phase Initial  -    Distance 830 feet  -    Walk Time 4.55 minutes  -    # of Rest Breaks 0 2    MPH 2.1  -    METS 1.7  -    RPE 13  -     VO2 Peak 5.9  -    Symptoms No  -    Resting HR 70 bpm  -    Resting BP 78/0  -    Max Ex. HR 125 bpm  -    Max Ex. BP 96/0  -    2 Minute Post BP 84/0  -  Oxygen Initial Assessment:   Oxygen Re-Evaluation:   Oxygen Discharge (Final Oxygen Re-Evaluation):   Initial Exercise Prescription:     Initial Exercise Prescription - 04/03/17 1100      Date of Initial Exercise RX and Referring Provider   Date 04/03/17   Referring Provider Loralie Champagne MD     NuStep   Level 2   Minutes 10   METs 2     Arm Ergometer   Level 1   Watts 15   Minutes 10   METs 2     Track   Laps 8   Minutes 10   METs 2.4     Prescription Details   Frequency (times per week) 3   Duration Progress to 45 minutes of aerobic exercise without signs/symptoms of physical distress     Intensity   THRR 40-80% of Max Heartrate 60-119   Ratings of Perceived Exertion 11-13   Perceived Dyspnea 0-4     Progression   Progression Continue to progress workloads to maintain intensity without signs/symptoms of physical distress.     Resistance Training   Training Prescription Yes   Weight 2   Reps 10-15      Perform Capillary Blood Glucose checks as needed.  Exercise Prescription Changes:      Exercise Prescription Changes    Row Name 04/23/17 1200 05/15/17 1600           Response to Exercise   Blood Pressure (Admit) 95/68 101/76      Blood Pressure (Exercise) 107/74 110/77      Blood Pressure (Exit) 98/74 98/74      Heart Rate (Admit) 76 bpm 83 bpm      Heart Rate (Exercise) 90 bpm 102 bpm      Heart Rate (Exit) 74 bpm 81 bpm      Rating of Perceived Exertion (Exercise) 11 13      Duration Progress to 45 minutes of aerobic exercise without signs/symptoms of physical distress Progress to 45 minutes of aerobic exercise without signs/symptoms of physical distress      Intensity THRR unchanged THRR unchanged        Progression   Progression Continue to progress workloads to  maintain intensity without signs/symptoms of physical distress. Continue to progress workloads to maintain intensity without signs/symptoms of physical distress.      Average METs 2.3 2.5        Resistance Training   Training Prescription Yes Yes      Weight 2 4lb      Reps 10-15 10-15        NuStep   Level 2 2      Minutes 10 10      METs 2 2.5        Arm Ergometer   Level 1 1      Watts 15 15      Minutes 10 10      METs 2.62 2.62        Track   Laps 8 8      Minutes 10 10      METs 2.4 2.4        Home Exercise Plan   Plans to continue exercise at Home (comment) Home (comment)      Frequency Add 3 additional days to program exercise sessions. Add 3 additional days to program exercise sessions.         Exercise Comments:      Exercise Comments    Row Name 04/23/17  1159 05/15/17 1612         Exercise Comments Revivewed METs and goals with pt.  reviewed goals with pt.         Exercise Goals and Review:      Exercise Goals    Row Name 04/03/17 [phone]             Exercise Goals   Increase Physical Activity Yes       Intervention Provide advice, education, support and counseling about physical activity/exercise needs.;Develop an individualized exercise prescription for aerobic and resistive training based on initial evaluation findings, risk stratification, comorbidities and participant's personal goals.       Expected Outcomes Achievement of increased cardiorespiratory fitness and enhanced flexibility, muscular endurance and strength shown through measurements of functional capacity and personal statement of participant.       Increase Strength and Stamina Yes  be able to climb stairs and uphill without SOB       Intervention Provide advice, education, support and counseling about physical activity/exercise needs.;Develop an individualized exercise prescription for aerobic and resistive training based on initial evaluation findings, risk stratification,  comorbidities and participant's personal goals.       Expected Outcomes Achievement of increased cardiorespiratory fitness and enhanced flexibility, muscular endurance and strength shown through measurements of functional capacity and personal statement of participant.          Exercise Goals Re-Evaluation :     Exercise Goals Re-Evaluation    Row Name 04/23/17 1158 05/15/17 1608           Exercise Goal Re-Evaluation   Exercise Goals Review Increase Physical Activity;Increase Strenth and Stamina Increase Physical Activity;Increase Strenth and Stamina      Comments Pt is doing well with exercise and states that he feels pretty good overall Pt is doing well with exercise in [place].  He states that he feels as though he is doing fair and thinks he can push himself more, but overall he feels much better than he did prior to beginning an exercise program.      Expected Outcomes Continue with exercise Rx and increase workloads as tolerated in order to increase exercise tolerance Continue with exercise Rx and increase workloads as tolerated in order to increase exercise tolerance          Discharge Exercise Prescription (Final Exercise Prescription Changes):     Exercise Prescription Changes - 05/15/17 1600      Response to Exercise   Blood Pressure (Admit) 101/76   Blood Pressure (Exercise) 110/77   Blood Pressure (Exit) 98/74   Heart Rate (Admit) 83 bpm   Heart Rate (Exercise) 102 bpm   Heart Rate (Exit) 81 bpm   Rating of Perceived Exertion (Exercise) 13   Duration Progress to 45 minutes of aerobic exercise without signs/symptoms of physical distress   Intensity THRR unchanged     Progression   Progression Continue to progress workloads to maintain intensity without signs/symptoms of physical distress.   Average METs 2.5     Resistance Training   Training Prescription Yes   Weight 4lb   Reps 10-15     NuStep   Level 2   Minutes 10   METs 2.5     Arm Ergometer   Level 1    Watts 15   Minutes 10   METs 2.62     Track   Laps 8   Minutes 10   METs 2.4     Home Exercise Plan   Plans  to continue exercise at Home (comment)   Frequency Add 3 additional days to program exercise sessions.      Nutrition:  Target Goals: Understanding of nutrition guidelines, daily intake of sodium 1500mg , cholesterol 200mg , calories 30% from fat and 7% or less from saturated fats, daily to have 5 or more servings of fruits and vegetables.  Biometrics:     Pre Biometrics - 04/03/17 1608      Pre Biometrics   Height 5' 3.5" (1.613 m)   Weight 179 lb 14.3 oz (81.6 kg)   Waist Circumference 40.75 inches   Hip Circumference 41.5 inches   Waist to Hip Ratio 0.98 %   BMI (Calculated) 31.4   Triceps Skinfold 20 mm   % Body Fat 31.2 %   Grip Strength 33.5 kg   Flexibility 13 in   Single Leg Stand 2.2 seconds       Nutrition Therapy Plan and Nutrition Goals:     Nutrition Therapy & Goals - 05/02/17 1116      Nutrition Therapy   Diet Carb Modified, Therapeutic Lifestyle Changes     Personal Nutrition Goals   Nutrition Goal Wt loss of 1-2 lb/week to a wt loss goal of 6-24 lb at graduation from Cardiac Rehab.      Intervention Plan   Intervention Prescribe, educate and counsel regarding individualized specific dietary modifications aiming towards targeted core components such as weight, hypertension, lipid management, diabetes, heart failure and other comorbidities.   Expected Outcomes Short Term Goal: Understand basic principles of dietary content, such as calories, fat, sodium, cholesterol and nutrients.;Long Term Goal: Adherence to prescribed nutrition plan.      Nutrition Discharge: Nutrition Scores:     Nutrition Assessments - 05/02/17 1116      MEDFICTS Scores   Pre Score 58      Nutrition Goals Re-Evaluation:   Nutrition Goals Re-Evaluation:   Nutrition Goals Discharge (Final Nutrition Goals Re-Evaluation):   Psychosocial: Target Goals:  Acknowledge presence or absence of significant depression and/or stress, maximize coping skills, provide positive support system. Participant is able to verbalize types and ability to use techniques and skills needed for reducing stress and depression.  Initial Review & Psychosocial Screening:     Initial Psych Review & Screening - 04/03/17 1605      Initial Review   Current issues with None Identified     Family Dynamics   Good Support System? Yes     Barriers   Psychosocial barriers to participate in program The patient should benefit from training in stress management and relaxation.;There are no identifiable barriers or psychosocial needs.     Screening Interventions   Interventions Encouraged to exercise      Quality of Life Scores:     Quality of Life - 04/03/17 1145      Quality of Life Scores   Health/Function Pre 19 %   Socioeconomic Pre 23.17 %   Psych/Spiritual Pre 16.29 %   Family Pre 20.83 %   GLOBAL Pre 19.37 %      PHQ-9: Recent Review Flowsheet Data    Depression screen [facility] 2/9 04/07/2017 01/17/2017   Decreased Interest 0 0   Down, Depressed, Hopeless 0 0   PHQ - 2 Score 0 0     Interpretation of Total Score  Total Score Depression Severity:  1-4 = Minimal depression, 5-9 = Mild depression, 10-14 = Moderate depression, 15-19 = Moderately severe depression, 20-27 = Severe depression   Psychosocial Evaluation and Intervention:  Psychosocial Re-Evaluation:     Psychosocial Re-Evaluation    [place] Name 04/22/17 1103 05/19/17 0941           Psychosocial Re-Evaluation   Current issues with Current Stress Concerns Current Stress Concerns      Interventions Stress management education;Encouraged to attend Cardiac Rehabilitation for the exercise Stress management education;Encouraged to attend Cardiac Rehabilitation for the exercise      Continue Psychosocial Services  No Follow up required No Follow up required        Initial Review   Source of  Stress Concerns Chronic Illness Chronic Illness         Psychosocial Discharge (Final Psychosocial Re-Evaluation):     Psychosocial Re-Evaluation - 05/19/17 0941      Psychosocial Re-Evaluation   Current issues with Current Stress Concerns   Interventions Stress management education;Encouraged to attend Cardiac Rehabilitation for the exercise   Continue Psychosocial Services  No Follow up required     Initial Review   Source of Stress Concerns Chronic Illness      Vocational Rehabilitation: Provide vocational rehab assistance to qualifying candidates.   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      Education: Education Goals: Education classes will be provided on a weekly basis, covering required topics. Participant will state understanding/return demonstration of topics presented.  Learning Barriers/Preferences:     Learning Barriers/Preferences - 04/03/17 3976      Learning Barriers/Preferences   Learning Barriers Sight   Learning Preferences Skilled Demonstration;Verbal Instruction;Video;Written Material      Education Topics: Count Your Pulse:  -Group instruction provided by verbal instruction, demonstration, patient participation and written materials to support subject.  Instructors address importance of being able to find your pulse and how to count your pulse when at home without a heart monitor.  Patients get hands on experience counting their pulse with staff help and individually.   Heart Attack, Angina, and Risk Factor Modification:  -Group instruction provided by verbal instruction, video, and written materials to support subject.  Instructors address signs and symptoms of angina and heart attacks.    Also discuss risk factors for  heart disease and how to make changes to improve heart health risk factors.   Functional Fitness:  -Group instruction provided by verbal instruction, demonstration, patient participation, and written materials to support subject.  Instructors address safety measures for doing things around the house.  Discuss how to get up and down off the floor, how to pick things up properly, how to safely get out of a chair without assistance, and balance training.   Meditation and Mindfulness:  -Group instruction provided by verbal instruction, patient participation, and written materials to support subject.  Instructor addresses importance of mindfulness and meditation practice to help reduce stress and improve awareness.  Instructor also leads participants through a meditation exercise.    Stretching for Flexibility and Mobility:  -Group instruction provided by verbal instruction, patient participation, and written materials to support subject.  Instructors lead participants through series of stretches that are designed to increase flexibility thus improving mobility.  These stretches are additional exercise for major muscle groups that are typically performed during regular warm up and cool down.   Hands Only CPR:  -Group verbal, video, and participation provides  a basic overview of AHA guidelines for community CPR. Role-play of emergencies allow participants the opportunity to practice calling for help and chest compression technique with discussion of AED use.   Hypertension: -Group verbal and written instruction that provides a basic overview of hypertension including the most recent diagnostic guidelines, risk factor reduction with self-care instructions and medication management.    Nutrition I class: Heart Healthy Eating:  -Group instruction provided by PowerPoint slides, verbal discussion, and written materials to support subject matter. The instructor gives an explanation and review of the  Therapeutic Lifestyle Changes diet recommendations, which includes a discussion on lipid goals, dietary fat, sodium, fiber, plant stanol/sterol esters, sugar, and the components of a well-balanced, healthy diet.   Nutrition II class: Lifestyle Skills:  -Group instruction provided by PowerPoint slides, verbal discussion, and written materials to support subject matter. The instructor gives an explanation and review of label reading, grocery shopping for heart health, heart healthy recipe modifications, and ways to make healthier choices when eating out.   Diabetes Question & Answer:  -Group instruction provided by PowerPoint slides, verbal discussion, and written materials to support subject matter. The instructor gives an explanation and review of diabetes co-morbidities, pre- and post-prandial blood glucose goals, pre-exercise blood glucose goals, signs, symptoms, and treatment of hypoglycemia and hyperglycemia, and foot care basics.   CARDIAC REHAB PHASE II EXERCISE from 05/16/2017 in [place]  Date  05/16/17  Educator  RD  Instruction Review Code  2- meets goals/outcomes      Diabetes Blitz:  -Group instruction provided by PowerPoint slides, verbal discussion, and written materials to support subject matter. The instructor gives an explanation and review of the physiology behind type 1 and type 2 diabetes, diabetes medications and rational behind using different medications, pre- and post-prandial blood glucose recommendations and Hemoglobin A1c goals, diabetes diet, and exercise including blood glucose guidelines for exercising safely.    Portion Distortion:  -Group instruction provided by PowerPoint slides, verbal discussion, written materials, and food models to support subject matter. The instructor gives an explanation of serving size versus portion size, changes in portions sizes over the last 20 years, and what consists of a serving from each food  group.   Stress Management:  -Group instruction provided by verbal instruction, video, and written materials to support subject matter.  Instructors review role of stress in heart disease and how to cope with stress positively.     Exercising on Your Own:  -Group instruction provided by verbal instruction, power point, and written materials to support subject.  Instructors discuss benefits of exercise, components of exercise, frequency and intensity of exercise, and end points for exercise.  Also discuss use of nitroglycerin and activating EMS.  Review options of places to exercise outside of rehab.  Review guidelines for sex with heart disease.   Cardiac Drugs I:  -Group instruction provided by verbal instruction and written materials to support subject.  Instructor reviews cardiac drug classes: antiplatelets, anticoagulants, beta blockers, and statins.  Instructor discusses reasons, side effects, and lifestyle considerations for each drug class.   Cardiac Drugs II:  -Group instruction provided by verbal instruction and written materials to support subject.  Instructor reviews cardiac drug classes: angiotensin converting enzyme inhibitors (ACE-I), angiotensin II receptor blockers (ARBs), nitrates, and calcium channel blockers.  Instructor discusses reasons, side effects, and lifestyle considerations for each drug class.   Anatomy and Physiology of the Circulatory System:  Group verbal and written instruction and models provide  basic cardiac anatomy and physiology, with the coronary electrical and arterial systems. Review of: AMI, Angina, Valve disease, Heart Failure, Peripheral Artery Disease, Cardiac Arrhythmia, Pacemakers, and the ICD.   Other Education:  -Group or individual verbal, written, or video instructions that support the educational goals of the cardiac rehab program.   Knowledge Questionnaire Score:     Knowledge Questionnaire Score - 04/03/17 1138      Knowledge  Questionnaire Score   Pre Score 21/24      Core Components/Risk Factors/Patient Goals at Admission:     Personal Goals and Risk Factors at Admission - 04/03/17 1600      Core Components/Risk Factors/Patient Goals on Admission    Weight Management Obesity;Yes;Weight Loss;Weight Maintenance   Intervention Weight Management: Develop a combined nutrition and exercise program designed to reach desired caloric intake, while maintaining appropriate intake of nutrient and fiber, sodium and fats, and appropriate energy expenditure required for the weight goal.;Weight Management: Provide education and appropriate resources to help participant work on and attain dietary goals.;Weight Management/Obesity: Establish reasonable short term and long term weight goals.;Obesity: Provide education and appropriate resources to help participant work on and attain dietary goals.   Admit Weight 179 lb 14.3 oz (81.6 kg)   Goal Weight: Short Term 169 lb (76.7 kg)   Goal Weight: Long Term 164 lb (74.4 kg)   Improve shortness of breath with ADL's Yes   Intervention Provide education, individualized exercise plan and daily activity instruction to help decrease symptoms of SOB with activities of daily living.   Expected Outcomes Short Term: Achieves a reduction of symptoms when performing activities of daily living.   Diabetes Yes   Intervention Provide education about signs/symptoms and action to take for hypo/hyperglycemia.;Provide education about proper nutrition, including hydration, and aerobic/resistive exercise prescription along with prescribed medications to achieve blood glucose in normal ranges: Fasting glucose 65-99 mg/dL   Expected Outcomes Short Term: Participant verbalizes understanding of the signs/symptoms and immediate care of hyper/hypoglycemia, proper foot care and importance of medication, aerobic/resistive exercise and nutrition plan for blood glucose control.;Long Term: Attainment of HbA1C < 7%.    Heart Failure Yes   Intervention Provide a combined exercise and nutrition program that is supplemented with education, support and counseling about heart failure. Directed toward relieving symptoms such as shortness of breath, decreased exercise tolerance, and extremity edema.   Expected Outcomes Improve functional capacity of life;Short term: Attendance in program 2-3 days a week with increased exercise capacity. Reported lower sodium intake. Reported increased fruit and vegetable intake. Reports medication compliance.;Short term: Daily weights obtained and reported for increase. Utilizing diuretic protocols set by physician.;Long term: Adoption of self-care skills and reduction of barriers for early signs and symptoms recognition and intervention leading to self-care maintenance.   Hypertension Yes   Intervention Provide education on lifestyle modifcations including regular physical activity/exercise, weight management, moderate sodium restriction and increased consumption of fresh fruit, vegetables, and low fat dairy, alcohol moderation, and smoking cessation.;Monitor prescription use compliance.   Expected Outcomes Short Term: Continued assessment and intervention until BP is < 140/18mm HG in hypertensive participants. < 130/36mm HG in hypertensive participants with diabetes, heart failure or chronic kidney disease.;Long Term: Maintenance of blood pressure at goal levels.   Lipids Yes   Intervention Provide education and support for participant on nutrition & aerobic/resistive exercise along with prescribed medications to achieve LDL 70mg , HDL >40mg .   Expected Outcomes Short Term: Participant states understanding of desired cholesterol values and is compliant with medications  prescribed. Participant is following exercise prescription and nutrition guidelines.;Long Term: Cholesterol controlled with medications as prescribed, with individualized exercise RX and with personalized nutrition plan. Value  goals: LDL < 70mg , HDL > 40 mg.   Stress Yes   Intervention Offer individual and/or small group education and counseling on adjustment to heart disease, stress management and health-related lifestyle change. Teach and support self-help strategies.;Refer participants experiencing significant psychosocial distress to appropriate mental health specialists for further evaluation and treatment. When possible, include family members and significant others in education/counseling sessions.   Expected Outcomes Short Term: Participant demonstrates changes in health-related behavior, relaxation and other stress management skills, ability to obtain effective social support, and compliance with psychotropic medications if prescribed.;Long Term: Emotional wellbeing is indicated by absence of clinically significant psychosocial distress or social isolation.      Core Components/Risk Factors/Patient Goals Review:    Core Components/Risk Factors/Patient Goals at Discharge (Final Review):    ITP Comments:     ITP Comments    Row Name 04/03/17 0750           ITP Comments Medical Director, Dr. Fransico Him          Comments: Trevor [name] is making expected progress toward personal goals after completing 18 sessions. Recommend continued exercise and life style modification education including  stress management and relaxation techniques to decrease cardiac risk profile. Trevor [name] continues to enjoy participating in phase 2 cardiac rehab and takes rest breaks as needed. Trevor [name]'s MAP has been between 70's to the low 90's. Will continue to monitor the patient throughout  the program.Athen Riel [name], RN,BSN 05/20/2017 2:28 PM

## 2017-05-19 NOTE — Patient Instructions (Signed)
1. Take 2 more Demadex today when you get home. 2. Tomorrow begin taking 60 mg of Demadex daily. 3. Return to clinic on Thursday for dressing change and labs.

## 2017-05-19 NOTE — Progress Notes (Addendum)
Patient presents for sick visit in [facility] today. Reports no problems with VAD equipment or concerns with drive line. Pt states that he has been feeling more SOB and has abdominal fullness.  Vital Signs:  Doppler Pressure 102 Automatc BP: 119/68 (90) HR: 72 SPO2: 98%  Weight: 183.6 lb w/o eqt D/c weight: 186.2 lb Home weights: 180 lbs  VAD Indication: DT- age excluding    VAD interrogation & Equipment Management (reviewed with [name]): Speed: 9400 Flow: 5.4 Power: 5.9 w    PI: 5.2  Alarms: none Events: rare  Fixed speed 9400 Low speed limit: 8600  Primary controller battery expiration:  17 mos Secondary controller battery expiration:  17 mos  Exit Site Care: Drive line is being maintained twice weekly by VAD Coordinators and/or Trevor [name] (sister). Gauze dressing removed and site care performed using sterile technique. Drive line exit site cleaned with Chlora prep applicators x 2, rinsed with saline, allowed to dry, and sensitive skin dressing with Silverlon disc applied. Exit site healed and incorporated, the velour is fully implanted at exit site. Redness noted around exit site, chronic in nature. No Small amount brown drainage noted on silver strip with slight foul odor noted. Have been cleaning with saline only, will try LVAD Skin sensitive kit and see if any improvement. Psoriasis rash noted around dressing site and under centurion foley attachment device. Pt reports the anchor is not staying on, "doesn't stick well". Will try the securement device in new kit with dual securement devices. Drive line anchor re-applied. Pt denies fever or chills.   Significant Events on VAD Support:  02/2017> GIB- AVM clipped x2  Device:Medtronic single lead Therapies: on at 231 bpm Last check: today  BP & Labs:  Doppler BP 102 - Doppler is reflecting modified systolic   Hgb 7.9 - No S/S of bleeding. Specifically denies melena/BRBPR. We will recheck this on Thursday.   LDH  stable at 274 with established baseline of 225- 300. Denies tea-colored urine. No power elevations noted on interrogation.   EKG and interrogation of his Medtronic device were performed today. EKG was his normal baseline of afib. Medtronic device showed and increase in his optivol sensor.   Patient Instructions:  1.Take 40 mg of Torsemide this evening and then resume a dose of 60 mg daily.  2. Return to [facility] Thursday  7/19 for dressing change and labs.  [name] VAD Coordinator   Office: [phone] 24/7 Emergency VAD Pager: [phone]     Trevor Hornstein Murphyis a 71 y.o.malewith a history of CAD s/p CABG x 4 2010, OSA, AS with TAVR 2015, CKD, DM2, paroxysmal atrial fibrillation, asthma, and ischemic cardiomyopathy with Medtronic ICD.   Relocated to [place] from [place] in early 2018. Over the last couple of years, he had been admitted multiple times for CHF. Last admission in [place] was in 1/18. Sounds like the hospitalization was complicated by cardiorenal syndrome and there was consideration of doing dialysis.  He was admitted in to [facility]/27[facility]16 with NYHA class IV symptoms and hypotension. He was placed on dual inotropes to facilitate diuresis, cardiac output and renal function.  CT surgery consulted for mechanical support and he was deemed appropriate for LVAD work up. He completed LVAD work up and was approved for HMII LVAD --> DT. On 12/11/16, he had IABP placed to optimize cardiac output and improve renal function. He then underwent HMII LVAD placement + tricuspid valve repair on 12/14/2015. Post operatively pressors weaned  off slowly. He required significant diuresis.  Course was complicated by RV dysfunction and renal dysfunction.  Patient was admitted with lower GI bleeding in 4/18.  He had been on ASA 325 (increased with mild LDH elevation) and warfarin INR 2-2.5.  He had 4 units PRBCs total.  Colonoscopy showed colonic AVM treated with APC and  clipping.  He was sent home off ASA and on warfarin INR goal 2-2.5.   Recently, creatinine was noted to increase as high as 3.67.  I cut back on his diuretics but he became volume overloaded.  I increased diuretics again with poor response.  In 5/18, he had RHC showing elevated right and left heart filling pressures and preserved cardiac output. I admitted him for IV diuresis.  Weight and creatinine came down, creatinine was 2.9 when discharged.  Ramp echo was done and speed was increased to 9200 rpm.  Sildenafil was increased to 40 mg tid.  At last visit, speed was increased to 9400 rpm and torsemide was decreased to 40 mg daily.   Patient was worked in today.  Over the weekend, he has noted increased exertional dyspnea.  He has been short of breath walking his dog.  He has had bendopnea and orthopnea.  No chest pain.  No lightheadedness.  Hemoglobin has been stable, continues to get octreotide injection. MAP 90 today.  Weight is actually down 3 lbs.   He has been noted to have a probable skin cancer on his neck, will need this removed.  Per conversation with dermatologist, he can continue his coumadin.   Optivol was reviewed today: fluid index > threshold, impedance trending down.   ECG (personally reviewed): atrial fibrillation rate 55, RBBB  Labs (3/18): LDH 331 => 401 => 443 => 366 => 313, K 3.6 => 3.2 => 3.5, creatinine 1.98 => 1.74 => 1.8 => 1.79, hgb 8.3 => 8.9 => 8.7 => 8.4, INR 2.11 => 2.4 Labs (4/18): hgb 9.4 Labs (5/18): LDH 269 => 249, INR 2.57, K 4.5, creatinine 1.92 => 2.71 => 3.67 => 3.43 => 3.1 => 2.93, hgb 9, plts 104 Labs (6/18): K 4.5, creatinine 3.6 => 3.43, hgb 8.5, INR 2.4, LDH 287\ Labs (7/18): K 4.5, creatinine 3.52, hgb 7.9  LVAD parameters: See nurse's note above.  I reviewed.   PMH: 1. CAD: s/p CABG in 2010.  2. Aortic stenosis: s/p TAVR in 2015. Valve looked ok on 2/18 echo.  3. Atrial fibrillation: Chronic.  4. Type II diabetes.  5. CKD: Stage III-IV,  cardiorenal syndrome.  6. Chronic systolic CHF: Ischemic cardiomyopathy with prominent RV dysfunction.  Medtronic ICD.  - Echo (2/18): EF 20-25%, moderately dilated LV, moderately dilated/moderately dysfunctional RV, severe TR.  - Cardiogenic shock 2/18 requiring milrinone, norepinephrine, and IABP.  - Heartmate II LVAD for DT placed 12/13/16.   - RHC (5/18): mean RA 16, PA 55/22 mean 33, mean PCWP 23, CI 2.59, PVR 3.87 7. OSA.  8. Thrombocytopenia: Post-op LVAD, resolved.  9. Tricuspid regurgitation: Severe, s/p repair with LVAD placement in 2/18.  10. Lower GI bleed (4/18): Colonic AVM, treated with APC and clipping.  11. Psoriasis  Social History   Social History  . Marital status: Widowed    Spouse name: N/A  . Number of children: 0  . Years of education: 14   Occupational History  . Not on file.   Social History Main Topics  . Smoking status: Never Smoker  . Smokeless tobacco: Never Used  . Alcohol use No  .  Drug use: No  . Sexual activity: Not Currently   Other Topics Concern  . Not on file   Social History Narrative   Patient is a widow. Moved to [place] from [place], [place]. Currently lives with his sister Trevor [name].    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    Family History  Problem Relation Age of Onset  . Heart failure Father   . Heart attack Father   . Healthy Mother   . Diabetes Paternal Grandfather    ROS: All systems reviewed and negative except as per HPI.   Current Outpatient Prescriptions  Medication Sig Dispense Refill  . albuterol (PROVENTIL HFA;VENTOLIN HFA) 108 (90 Base) MCG/ACT inhaler Inhale 2 puffs into the lungs every 4 (four) hours as needed for wheezing or shortness of breath. 1 Inhaler 5  . amLODipine (NORVASC) 5 MG tablet Take 1 tablet (5 mg total) by mouth daily. 30 tablet 6  . ascorbic acid (VITAMIN C) 1000 MG tablet Take 1,000 mg by mouth daily at 12 noon.     Marland Kitchen atorvastatin (LIPITOR) 40 MG tablet Take 1 tablet (40  mg total) by mouth daily. 30 tablet 6  . benzonatate (TESSALON) 100 MG capsule Take 1 capsule (100 mg total) by mouth 3 (three) times daily as needed for cough. 20 capsule 6  . busPIRone (BUSPAR) 5 MG tablet Take 1 tablet (5 mg total) by mouth 2 (two) times daily. 60 tablet 6  . docusate sodium (COLACE) 100 MG capsule Take 1 capsule (100 mg total) by mouth 2 (two) times daily. 60 capsule 12  . fluticasone (FLOVENT HFA) 110 MCG/ACT inhaler Inhale 2 puffs into the lungs 2 (two) times daily. 1 Inhaler 5  . gabapentin (NEURONTIN) 600 MG tablet Take 0.5 tablets (300 mg total) by mouth 2 (two) times daily. 60 tablet 6  . insulin glargine (LANTUS) 100 unit/mL SOPN Inject 0.2 mLs (20 Units total) into the skin at bedtime. 15 mL 0  . levothyroxine (SYNTHROID, LEVOTHROID) 25 MCG tablet Take 1 tablet (25 mcg total) by mouth daily before breakfast. 30 tablet 6  . metolazone (ZAROXOLYN) 2.5 MG tablet Take 1 tablet (2.5 mg total) by mouth once a week. 10 tablet 3  . Multiple Vitamin (MULTIVITAMIN WITH MINERALS) TABS tablet Take 1 tablet by mouth daily at 12 noon.    Marland Kitchen octreotide (SANDOSTATIN LAR) 20 MG injection Inject 20 mg into the muscle every 28 (twenty-eight) days. 1 each 2  . pantoprazole (PROTONIX) 40 MG tablet Take 1 tablet (40 mg total) by mouth daily at 12 noon. 30 tablet 6  . potassium chloride SA (K-DUR,KLOR-CON) 20 MEQ tablet TAKE 2 TABLETS BY MOUTH DAILY WITH AN ADDITIONAL 1 TABLET ONCE WEEKLY ON METOLAZONE DAYS 182 tablet 3  . sildenafil (REVATIO) 20 MG tablet Take 2 tablets (40 mg total) by mouth 3 (three) times daily. 180 tablet 6  . torsemide (DEMADEX) 20 MG tablet Take 2 tablets (40 mg total) by mouth daily. 90 tablet 5  . traMADol (ULTRAM) 50 MG tablet TAKE 1 TABLET BY MOUTH EVERY 6 HOURS AS NEEDED FOR MODERATE PAIN 30 tablet 2  . traZODone (DESYREL) 50 MG tablet TAKE 1 TABLET(50 MG) BY MOUTH AT BEDTIME AS NEEDED FOR SLEEP 30 tablet 0  . albuterol (PROVENTIL) (2.5 MG/3ML) 0.083% nebulizer  solution Take 3 mLs (2.5 mg total) by nebulization every 4 (four) hours. And as needed (Patient not taking: Reported on 05/19/2017) 150 mL 5  . warfarin (COUMADIN) 5 MG tablet  Take 7.5 mg (1 and 1/2 tablets) daily except 5 mg (1 tablet) on Tues and Thurs.     No current facility-administered medications for this encounter.    BP 119/68 Comment: map-90  Pulse 72   Ht 5' 5"  (1.651 m)   Wt 183 lb 9.6 oz (83.3 kg)   SpO2 98%   BMI 30.55 kg/m   MAP 90 General: NAD Neck: JVP 10-11 cm, no thyromegaly or thyroid nodule.  Lungs: CTAB  CV: LVAD hum. 1+ edema 1/2 to knees bilaterally.  No carotid bruit.   Abdomen: Soft, nontender, no hepatosplenomegaly, no distention.  Skin: Psoriasis lesions.  Neurologic: Alert and oriented x 3.  Psych: Normal affect. Extremities: No clubbing or cyanosis.  HEENT: Normal.   Assessment/Plan: 1. Chronic systolic CHF: Ischemic cardiomyopathy, EF 20-25% with RV dysfunction on 2/18 echo pre-LVAD.  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  Medtronic ICD.  Overall, he has felt much better post-LVAD.  However, he developed AK and was eventually admitted in 5/18 after RHC showed elevated filling pressures.  He was diuresed and creatinine came down. Creatinine rose again and torsemide decreased to 40 mg daily with speed up to 9400. Today, he is volume overloaded by exam and Optivol.  NYHA class III, symptoms worse over the last few days.  Creatinine has been elevated but stable.  - He will take torsemide 80 mg total today then increase to 60 mg daily.  BMET today and again on Thursday.   - Continue warfarin, no ASA with recent GI bleeding.  - Continue cardiac rehab.     2. CAD: s/p CABG.  No chest pain.  He is on atorvastatin.  3. CKD stage III:  Creatinine continues to fluctuate up and down.  Check BMET today and again later this week.    4. Atrial fibrillation: Chronic.  On warfarin.  5. RV failure: He is on Revatio, now increased to 40 mg tid.   6. HTN: MAP controlled.    7.  Anemia: Baseline anemia of renal disease/chronic disease but recent bleeding from colonic AVMs. Hemoglobin is a bit lower today but no overt bleeding.  Will repeat CBC later this week.   8. 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  - Continue octreotide injections.   Loralie Champagne 05/19/2017

## 2017-05-19 NOTE — Progress Notes (Signed)
Trevor [name] reports having increased shortness of breath that he noticed this morning while walking his dog this morning. Upon assessment lung fields with diminished breath sounds in the left base otherwise lungs essentially clear upon ascultation. Oxygen saturation 99% on room air. Trace bilateral edema noted.  Weight 83.6 kg. Blood pressure 105/76 via automatic cuff with a Map of 86. [name], [place] coordinator called and notified. Trevor [name] is going to the LVAD clinic to be evaluated this morning. Patient given a copy of his exercise flow sheet to take with him.Barnet Pall, RN,BSN 05/19/2017 10:44 AM

## 2017-05-19 NOTE — Telephone Encounter (Signed)
Received page from cardiac rehab stating the patient was experiencing increased shortness of breath and weight gain. Personally spoke with patient via phone who stated he has "felt bad since this morning and has no energy". C/o abdominal fullness. Patient instructed to come to clinic for further evaluation.  Balinda Quails RN, VAD Coordinator 24/7 pager [phone]

## 2017-05-20 ENCOUNTER — Telehealth (HOSPITAL_COMMUNITY): Payer: Self-pay | Admitting: Unknown Physician Specialty

## 2017-05-20 NOTE — Telephone Encounter (Signed)
Received a call from cardiac rehab regarding pts hgb drawn yesterday. Nurse reported that the pt was more SOB than usual in rehab today. Pt is scheduled for labs on Thursday 7/19. We will reevaluate pt at this time.

## 2017-05-21 ENCOUNTER — Encounter (HOSPITAL_COMMUNITY): Payer: Medicare Other

## 2017-05-22 ENCOUNTER — Other Ambulatory Visit (HOSPITAL_COMMUNITY): Payer: Self-pay | Admitting: Unknown Physician Specialty

## 2017-05-22 ENCOUNTER — Ambulatory Visit (HOSPITAL_COMMUNITY): Payer: Self-pay | Admitting: Pharmacist

## 2017-05-22 ENCOUNTER — Ambulatory Visit (HOSPITAL_COMMUNITY)
Admission: RE | Admit: 2017-05-22 | Discharge: 2017-05-22 | Disposition: A | Discharge status: Home / Self Care | Payer: Medicare Other | Source: Ambulatory Visit | Attending: Cardiology | Admitting: Cardiology

## 2017-05-22 DIAGNOSIS — Z95811 Presence of heart assist device: Secondary | ICD-10-CM

## 2017-05-22 DIAGNOSIS — Z4502 Encounter for adjustment and management of automatic implantable cardiac defibrillator: Secondary | ICD-10-CM | POA: Diagnosis present

## 2017-05-22 DIAGNOSIS — Z7901 Long term (current) use of anticoagulants: Secondary | ICD-10-CM | POA: Insufficient documentation

## 2017-05-22 LAB — BASIC METABOLIC PANEL
ANION GAP: 12 (ref 5–15)
BUN: 65 mg/dL — AB (ref 6–20)
CALCIUM: 8.6 mg/dL — AB (ref 8.9–10.3)
CO2: 27 mmol/L (ref 22–32)
Chloride: 94 mmol/L — ABNORMAL LOW (ref 101–111)
Creatinine, Ser: 3.76 mg/dL — ABNORMAL HIGH (ref 0.61–1.24)
GFR calc Af Amer: 17 mL/min — ABNORMAL LOW (ref >60)
GFR calc non Af Amer: 15 mL/min — ABNORMAL LOW (ref >60)
GLUCOSE: 117 mg/dL — AB (ref 65–99)
POTASSIUM: 4.1 mmol/L (ref 3.5–5.1)
SODIUM: 133 mmol/L — AB (ref 135–145)

## 2017-05-22 LAB — CBC
HEMATOCRIT: 22.9 % — AB (ref 39.0–52.0)
Hemoglobin: 7.8 g/dL — ABNORMAL LOW (ref 13.0–17.0)
MCH: 31.7 pg (ref 26.0–34.0)
MCHC: 34.1 g/dL (ref 30.0–36.0)
MCV: 93.1 fL (ref 78.0–100.0)
Platelets: 112 10*3/uL — ABNORMAL LOW (ref 150–400)
RBC: 2.46 MIL/uL — ABNORMAL LOW (ref 4.22–5.81)
RDW: 15.8 % — AB (ref 11.5–15.5)
WBC: 7.6 10*3/uL (ref 4.0–10.5)

## 2017-05-22 LAB — PROTIME-INR
INR: 2.73
PROTHROMBIN TIME: 29.5 s — AB (ref 11.4–15.2)

## 2017-05-22 NOTE — Progress Notes (Signed)
Patient presents for one week dressing change, labs, and VAD interrogation. Reports no problems with VAD equipment or concerns with drive line.   VAD Indication: DT- age excluding    VAD interrogation & Equipment Management: Speed: 9400 Flow: 6.0 Power: 6.3w    PI: 3.0  Alarms: none Events: rare  Fixed speed 9400 Low speed limit: 8800  Primary controller battery expiration:  18 mos Secondary controller battery expiration:  18 mos   Exit Site Care: Drive line is being once weekly by VAD Coordinators. Caregiver [name] -sister) is currently unavailable. Gentle window dressing not sticking, pt had taped down one edge; dressing removed and site care performed using sterile technique. Drive line exit site cleaned with Chlora prep applicators x 2, rinsed with saline, allowed to dry, and Silverlon disc applied, topped with antimicrobial sponge and gentle window dressing. Skin protectant applied before placement. Dressing then covered with 2 large Tegaderm dressing. Exit site healed and incorporated, the velour is fully implanted at exit site. Redness noted around exit site, chronic in nature. Small amount brown drainage noted with no tenderness or foul odor noted. Psoriasis rash noted around dressing site and under foley anchor attachment device. Dual anchor attachment device re-applied. Pt denies fever or chills.    Patient Instructions:  1. Return to [place] clinic in one week for dressing change and lab.   [name], BSN VAD Coordinator   Office: [phone] 24/7 Emergency VAD Pager: [phone]

## 2017-05-23 ENCOUNTER — Encounter (HOSPITAL_COMMUNITY)
Admission: RE | Admit: 2017-05-23 | Discharge: 2017-05-23 | Disposition: A | Discharge status: Home / Self Care | Payer: Medicare Other | Source: Ambulatory Visit | Attending: Cardiology | Admitting: Cardiology

## 2017-05-23 ENCOUNTER — Telehealth (HOSPITAL_COMMUNITY): Payer: Self-pay | Admitting: *Deleted

## 2017-05-23 DIAGNOSIS — Z95811 Presence of heart assist device: Secondary | ICD-10-CM | POA: Diagnosis not present

## 2017-05-23 DIAGNOSIS — I5022 Chronic systolic (congestive) heart failure: Secondary | ICD-10-CM

## 2017-05-23 NOTE — Telephone Encounter (Signed)
Received a call from patient stating his pharmacy was unable to refill his sildenafil. Personally called pharmacist at [facility] who stated they refilled the last prescription with the wrong amount so it appeared to be too soon to fill. Stated she would correct this and call the insurance if necessary and will call the patient when it is ready to pick up. Patient stated he had enough to get through the weekend. Will follow up with him Monday to ensure he receives the medication.  Balinda Quails RN, VAD Coordinator 24/7 pager [phone]

## 2017-05-26 ENCOUNTER — Other Ambulatory Visit (HOSPITAL_COMMUNITY): Payer: Self-pay | Admitting: Unknown Physician Specialty

## 2017-05-26 ENCOUNTER — Other Ambulatory Visit: Payer: Self-pay | Admitting: Plastic Surgery

## 2017-05-26 ENCOUNTER — Encounter (HOSPITAL_COMMUNITY)
Admission: RE | Admit: 2017-05-26 | Discharge: 2017-05-26 | Disposition: A | Discharge status: Home / Self Care | Payer: Medicare Other | Source: Ambulatory Visit | Attending: Cardiology | Admitting: Cardiology

## 2017-05-26 ENCOUNTER — Encounter: Payer: Self-pay | Admitting: Family

## 2017-05-26 DIAGNOSIS — Z7901 Long term (current) use of anticoagulants: Secondary | ICD-10-CM

## 2017-05-26 DIAGNOSIS — I5022 Chronic systolic (congestive) heart failure: Secondary | ICD-10-CM

## 2017-05-26 DIAGNOSIS — Z95811 Presence of heart assist device: Secondary | ICD-10-CM | POA: Diagnosis not present

## 2017-05-27 ENCOUNTER — Other Ambulatory Visit (HOSPITAL_COMMUNITY): Payer: Self-pay | Admitting: Unknown Physician Specialty

## 2017-05-27 DIAGNOSIS — Z95811 Presence of heart assist device: Secondary | ICD-10-CM

## 2017-05-27 DIAGNOSIS — Z7901 Long term (current) use of anticoagulants: Secondary | ICD-10-CM

## 2017-05-28 ENCOUNTER — Telehealth (HOSPITAL_COMMUNITY): Payer: Self-pay | Admitting: *Deleted

## 2017-05-28 ENCOUNTER — Encounter (HOSPITAL_COMMUNITY)
Admission: RE | Admit: 2017-05-28 | Discharge: 2017-05-28 | Disposition: A | Discharge status: Home / Self Care | Payer: Medicare Other | Source: Ambulatory Visit | Attending: Cardiology | Admitting: Cardiology

## 2017-05-28 DIAGNOSIS — Z95811 Presence of heart assist device: Secondary | ICD-10-CM

## 2017-05-28 DIAGNOSIS — I5022 Chronic systolic (congestive) heart failure: Secondary | ICD-10-CM

## 2017-05-28 NOTE — Telephone Encounter (Signed)
Patient called stating he was at the dermatologist office this morning and was diagnosed with Invasive squamous cell carcinoma. He is being referred to a skin care surgery center. Will inform Dr Aundra Dubin and other [place].   Balinda Quails RN, VAD Coordinator 24/7 pager [phone]

## 2017-05-29 ENCOUNTER — Ambulatory Visit (HOSPITAL_COMMUNITY): Payer: Self-pay | Admitting: Pharmacist

## 2017-05-29 ENCOUNTER — Ambulatory Visit (HOSPITAL_COMMUNITY)
Admission: RE | Admit: 2017-05-29 | Discharge: 2017-05-29 | Disposition: A | Discharge status: Home / Self Care | Payer: Medicare Other | Source: Ambulatory Visit | Attending: Cardiology | Admitting: Cardiology

## 2017-05-29 ENCOUNTER — Telehealth (HOSPITAL_COMMUNITY): Payer: Self-pay | Admitting: *Deleted

## 2017-05-29 DIAGNOSIS — Z7901 Long term (current) use of anticoagulants: Secondary | ICD-10-CM | POA: Insufficient documentation

## 2017-05-29 DIAGNOSIS — Z95811 Presence of heart assist device: Secondary | ICD-10-CM

## 2017-05-29 LAB — BASIC METABOLIC PANEL
Anion gap: 13 (ref 5–15)
BUN: 53 mg/dL — AB (ref 6–20)
CALCIUM: 8.9 mg/dL (ref 8.9–10.3)
CO2: 23 mmol/L (ref 22–32)
CREATININE: 3.23 mg/dL — AB (ref 0.61–1.24)
Chloride: 99 mmol/L — ABNORMAL LOW (ref 101–111)
GFR calc non Af Amer: 18 mL/min — ABNORMAL LOW (ref >60)
GFR, EST AFRICAN AMERICAN: 21 mL/min — AB (ref >60)
Glucose, Bld: 140 mg/dL — ABNORMAL HIGH (ref 65–99)
Potassium: 3.6 mmol/L (ref 3.5–5.1)
SODIUM: 135 mmol/L (ref 135–145)

## 2017-05-29 LAB — CBC
HCT: 22.9 % — ABNORMAL LOW (ref 39.0–52.0)
Hemoglobin: 7.7 g/dL — ABNORMAL LOW (ref 13.0–17.0)
MCH: 31.4 pg (ref 26.0–34.0)
MCHC: 33.6 g/dL (ref 30.0–36.0)
MCV: 93.5 fL (ref 78.0–100.0)
PLATELETS: 123 10*3/uL — AB (ref 150–400)
RBC: 2.45 MIL/uL — AB (ref 4.22–5.81)
RDW: 16 % — AB (ref 11.5–15.5)
WBC: 7.1 10*3/uL (ref 4.0–10.5)

## 2017-05-29 LAB — IRON AND TIBC
IRON: 52 ug/dL (ref 45–182)
Saturation Ratios: 21 % (ref 17.9–39.5)
TIBC: 244 ug/dL — ABNORMAL LOW (ref 250–450)
UIBC: 192 ug/dL

## 2017-05-29 LAB — PROTIME-INR
INR: 4.1
PROTHROMBIN TIME: 40.7 s — AB (ref 11.4–15.2)

## 2017-05-29 LAB — FERRITIN: FERRITIN: 490 ng/mL — AB (ref 24–336)

## 2017-05-29 NOTE — Telephone Encounter (Signed)
Message left with VAD coordinator.  Reviewed lab work obtain this morning.  Pt with further decrease in his hgb.  Pt level is 7.7.  Noted other lab work showed decrease in ferritin and iron levels.  Asked if ok for pt to continue to exercise and the plan for determining anemia.  Contact information provided. Cherre Huger, BSN Cardiac and Training and development officer

## 2017-05-30 ENCOUNTER — Other Ambulatory Visit (HOSPITAL_COMMUNITY): Payer: Self-pay

## 2017-05-30 ENCOUNTER — Encounter (HOSPITAL_COMMUNITY)
Admission: RE | Admit: 2017-05-30 | Discharge: 2017-05-30 | Disposition: A | Discharge status: Home / Self Care | Payer: Medicare Other | Source: Ambulatory Visit | Attending: Cardiology | Admitting: Cardiology

## 2017-05-30 ENCOUNTER — Ambulatory Visit (HOSPITAL_COMMUNITY)
Admission: RE | Admit: 2017-05-30 | Discharge: 2017-05-30 | Disposition: A | Discharge status: Home / Self Care | Payer: Medicare Other | Source: Ambulatory Visit | Attending: Cardiology | Admitting: Cardiology

## 2017-05-30 ENCOUNTER — Encounter (HOSPITAL_COMMUNITY): Admission: RE | Admit: 2017-05-30 | Payer: Medicare Other | Source: Ambulatory Visit

## 2017-05-30 ENCOUNTER — Other Ambulatory Visit (HOSPITAL_COMMUNITY): Payer: Self-pay | Admitting: *Deleted

## 2017-05-30 DIAGNOSIS — Z95811 Presence of heart assist device: Secondary | ICD-10-CM | POA: Diagnosis not present

## 2017-05-30 DIAGNOSIS — D649 Anemia, unspecified: Secondary | ICD-10-CM

## 2017-05-30 LAB — PREPARE RBC (CROSSMATCH)

## 2017-05-30 MED ORDER — SODIUM CHLORIDE 0.9 % IV SOLN
Freq: Once | INTRAVENOUS | Status: DC
Start: 2017-05-30 — End: 2017-05-31

## 2017-05-30 MED ORDER — SODIUM CHLORIDE 0.9 % IV SOLN
Freq: Once | INTRAVENOUS | Status: AC
  Administered 2017-06-30: 07:00:00 via INTRAVENOUS

## 2017-05-30 NOTE — Progress Notes (Signed)
Discussed labs with Dr Aundra Dubin. Hemoglobin stable in the 7's. Will order for 1 unit PRBCs to be given after cardiac rehab today in Medical Day. Patient notified.

## 2017-05-31 LAB — TYPE AND SCREEN
ABO/RH(D): A NEG
Antibody Screen: NEGATIVE
Unit division: 0

## 2017-05-31 LAB — BPAM RBC
BLOOD PRODUCT EXPIRATION DATE: 201808142359
ISSUE DATE / TIME: 201807271031
UNIT TYPE AND RH: 600

## 2017-06-02 ENCOUNTER — Encounter (HOSPITAL_COMMUNITY)
Admission: RE | Admit: 2017-06-02 | Discharge: 2017-06-02 | Disposition: A | Discharge status: Home / Self Care | Payer: Medicare Other | Source: Ambulatory Visit | Attending: Cardiology | Admitting: Cardiology

## 2017-06-02 DIAGNOSIS — I5022 Chronic systolic (congestive) heart failure: Secondary | ICD-10-CM

## 2017-06-02 DIAGNOSIS — Z95811 Presence of heart assist device: Secondary | ICD-10-CM

## 2017-06-02 NOTE — Progress Notes (Signed)
Trevor [name] returned to exercise today per LVAD coordinator [name] and exercised without difficulty. Post exercise doppler blood pressure 102. LVAD coordinator called and notified. No new order received. No complaints upon exit from cardiac rehab.Will continue to monitor the patient throughout  the program.[name] [name], RN,BSN 06/02/2017 11:15 AM

## 2017-06-03 ENCOUNTER — Ambulatory Visit (HOSPITAL_COMMUNITY): Payer: Self-pay | Admitting: Pharmacist

## 2017-06-03 ENCOUNTER — Other Ambulatory Visit (HOSPITAL_COMMUNITY): Payer: Self-pay | Admitting: *Deleted

## 2017-06-03 ENCOUNTER — Ambulatory Visit (HOSPITAL_COMMUNITY)
Admission: RE | Admit: 2017-06-03 | Discharge: 2017-06-03 | Disposition: A | Discharge status: Home / Self Care | Payer: Medicare Other | Source: Ambulatory Visit | Attending: Cardiology | Admitting: Cardiology

## 2017-06-03 ENCOUNTER — Encounter (HOSPITAL_COMMUNITY): Payer: Self-pay

## 2017-06-03 VITALS — BP 94/66 | HR 75 | Resp 16 | Ht 65.0 in | Wt 185.0 lb

## 2017-06-03 DIAGNOSIS — I50810 Right heart failure, unspecified: Secondary | ICD-10-CM

## 2017-06-03 DIAGNOSIS — I482 Chronic atrial fibrillation: Secondary | ICD-10-CM | POA: Insufficient documentation

## 2017-06-03 DIAGNOSIS — N184 Chronic kidney disease, stage 4 (severe): Secondary | ICD-10-CM | POA: Diagnosis not present

## 2017-06-03 DIAGNOSIS — Z833 Family history of diabetes mellitus: Secondary | ICD-10-CM | POA: Diagnosis not present

## 2017-06-03 DIAGNOSIS — I5042 Chronic combined systolic (congestive) and diastolic (congestive) heart failure: Secondary | ICD-10-CM | POA: Diagnosis not present

## 2017-06-03 DIAGNOSIS — Z7901 Long term (current) use of anticoagulants: Secondary | ICD-10-CM | POA: Insufficient documentation

## 2017-06-03 DIAGNOSIS — Z95811 Presence of heart assist device: Secondary | ICD-10-CM

## 2017-06-03 DIAGNOSIS — I509 Heart failure, unspecified: Secondary | ICD-10-CM

## 2017-06-03 DIAGNOSIS — E1122 Type 2 diabetes mellitus with diabetic chronic kidney disease: Secondary | ICD-10-CM | POA: Insufficient documentation

## 2017-06-03 DIAGNOSIS — Z9189 Other specified personal risk factors, not elsewhere classified: Secondary | ICD-10-CM

## 2017-06-03 DIAGNOSIS — K922 Gastrointestinal hemorrhage, unspecified: Secondary | ICD-10-CM | POA: Insufficient documentation

## 2017-06-03 DIAGNOSIS — Z79899 Other long term (current) drug therapy: Secondary | ICD-10-CM | POA: Diagnosis not present

## 2017-06-03 DIAGNOSIS — Z8249 Family history of ischemic heart disease and other diseases of the circulatory system: Secondary | ICD-10-CM | POA: Insufficient documentation

## 2017-06-03 DIAGNOSIS — R5383 Other fatigue: Secondary | ICD-10-CM | POA: Diagnosis not present

## 2017-06-03 DIAGNOSIS — Z09 Encounter for follow-up examination after completed treatment for conditions other than malignant neoplasm: Secondary | ICD-10-CM | POA: Insufficient documentation

## 2017-06-03 DIAGNOSIS — D638 Anemia in other chronic diseases classified elsewhere: Secondary | ICD-10-CM | POA: Diagnosis not present

## 2017-06-03 DIAGNOSIS — C4490 Unspecified malignant neoplasm of skin, unspecified: Secondary | ICD-10-CM | POA: Diagnosis not present

## 2017-06-03 DIAGNOSIS — Z794 Long term (current) use of insulin: Secondary | ICD-10-CM | POA: Diagnosis not present

## 2017-06-03 DIAGNOSIS — I2510 Atherosclerotic heart disease of native coronary artery without angina pectoris: Secondary | ICD-10-CM | POA: Insufficient documentation

## 2017-06-03 DIAGNOSIS — I5022 Chronic systolic (congestive) heart failure: Secondary | ICD-10-CM | POA: Diagnosis not present

## 2017-06-03 DIAGNOSIS — Z951 Presence of aortocoronary bypass graft: Secondary | ICD-10-CM | POA: Insufficient documentation

## 2017-06-03 DIAGNOSIS — Z79891 Long term (current) use of opiate analgesic: Secondary | ICD-10-CM | POA: Insufficient documentation

## 2017-06-03 DIAGNOSIS — I130 Hypertensive heart and chronic kidney disease with heart failure and stage 1 through stage 4 chronic kidney disease, or unspecified chronic kidney disease: Secondary | ICD-10-CM | POA: Insufficient documentation

## 2017-06-03 LAB — COMPREHENSIVE METABOLIC PANEL
ALK PHOS: 89 U/L (ref 38–126)
ALT: 18 U/L (ref 17–63)
AST: 25 U/L (ref 15–41)
Albumin: 3.9 g/dL (ref 3.5–5.0)
Anion gap: 14 (ref 5–15)
BILIRUBIN TOTAL: 1.5 mg/dL — AB (ref 0.3–1.2)
BUN: 64 mg/dL — AB (ref 6–20)
CALCIUM: 8.7 mg/dL — AB (ref 8.9–10.3)
CO2: 24 mmol/L (ref 22–32)
CREATININE: 4 mg/dL — AB (ref 0.61–1.24)
Chloride: 97 mmol/L — ABNORMAL LOW (ref 101–111)
GFR calc Af Amer: 16 mL/min — ABNORMAL LOW (ref >60)
GFR, EST NON AFRICAN AMERICAN: 14 mL/min — AB (ref >60)
GLUCOSE: 178 mg/dL — AB (ref 65–99)
POTASSIUM: 3.8 mmol/L (ref 3.5–5.1)
Sodium: 135 mmol/L (ref 135–145)
TOTAL PROTEIN: 7.3 g/dL (ref 6.5–8.1)

## 2017-06-03 LAB — CBC
HEMATOCRIT: 25.1 % — AB (ref 39.0–52.0)
HEMOGLOBIN: 8.5 g/dL — AB (ref 13.0–17.0)
MCH: 30.7 pg (ref 26.0–34.0)
MCHC: 33.9 g/dL (ref 30.0–36.0)
MCV: 90.6 fL (ref 78.0–100.0)
Platelets: 117 10*3/uL — ABNORMAL LOW (ref 150–400)
RBC: 2.77 MIL/uL — ABNORMAL LOW (ref 4.22–5.81)
RDW: 15.8 % — AB (ref 11.5–15.5)
WBC: 9.3 10*3/uL (ref 4.0–10.5)

## 2017-06-03 LAB — PREALBUMIN: Prealbumin: 24.8 mg/dL (ref 18–38)

## 2017-06-03 LAB — PROTIME-INR
INR: 2.08
PROTHROMBIN TIME: 23.7 s — AB (ref 11.4–15.2)

## 2017-06-03 LAB — LACTATE DEHYDROGENASE: LDH: 279 U/L — ABNORMAL HIGH (ref 98–192)

## 2017-06-03 MED ORDER — TORSEMIDE 20 MG PO TABS: 40.0000 mg | ORAL_TABLET | Freq: Every day | ORAL | 5 refills | Status: DC

## 2017-06-03 MED ORDER — TORSEMIDE 20 MG PO TABS: 60.0000 mg | ORAL_TABLET | Freq: Every day | ORAL | 5 refills | Status: DC

## 2017-06-03 NOTE — Patient Instructions (Addendum)
Return to clinic in 1 month for doctor visit. Return in 1 week for dressing and labs.  Decrease Torsemide to 40 mg daily.  You look great today!!!! Keep up the good work!

## 2017-06-03 NOTE — Progress Notes (Addendum)
Patient presents for 1 month  follow up in [facility] today. Reports no problems with VAD equipment or concerns with drive line. States he is scheduled for carcinoma removal on 8/23. I verified with the surgery center that he does not need to lower INR or stop aspirin prior to procedure. States he still feels fatigued with exertion. Says he is getting out more and trying to meet new people.  Vital Signs:  Doppler Pressure 96   Automatc BP: 94/66 (77) HR:75   SPO2:96  %  Weight: 185 lb w/o eqt Last weight: 183.6 lb Home weights: 180 lbs   VAD Indication: Destination therapy - age excluding  VAD interrogation & Equipment Management (reviewed with Dr.McLean): Speed:9400 Flow: 6.2 Power:6.3 w    PI:4.6  Alarms: no clinical alarms Events: 3-5 PI events daily  Fixed speed 9400 Low speed limit: 8800  Primary Controller:  Replace back up battery in 68months. Back up controller:   Replace back up battery in 17 months.  Annual Equipment Maintenance on UBC/PM was performed on 12/2016.   I reviewed the LVAD parameters from today and compared the results to the patient's prior recorded data. LVAD interrogation was NEGATIVE for significant power changes, NEGATIVE for clinical alarms and STABLE for PI events/speed drops. No programming changes were made and pump is functioning within specified parameters. Pt is performing daily controller and system monitor self tests along with completing weekly and monthly maintenance for LVAD equipment.  LVAD equipment check completed and is in good working order. Back-up equipment present. Charged back up battery and performed self-test on equipment.   Exit Site Care: Drive line is being once weekly by VAD Coordinators. Caregiver Trevor [name] -sister) is currently unavailable. Gentle window dressing not sticking, pt had taped down one edge; dressing removed and site care performed using sterile technique. Drive line exit site cleaned with Chlora prep  applicators x 2, rinsed with saline, allowed to dry, and biopatch disc applied, topped with antimicrobial sponge and gentle window dressing. Skin protectant applied before placement. Dressing then covered with 2 large Tegaderm dressing. Exit site healed and incorporated, the velour is fully implanted at exit site. Redness noted around exit site, chronic in nature. Small amount brown drainage noted with no tenderness or foul odor noted. Psoriasis rash noted around dressing site and under foley anchor attachment device. Centurion foley anchor attachment device re-applied. Pt denies fever or chills.    Significant Events on VAD Support:  02/2017> GIB- AVM clipped x2  Device:Medtronic single lead Therapies: on at 231 bpm Last check: 05/19/17   BP & Labs:  MAP 96 - Doppler is reflecting modified systolic  Hgb 8.5 - No S/S of bleeding. Specifically denies melena/BRBPR or nosebleeds.  LDH stable at 279 with established baseline of 225- 300. Denies tea-colored urine. No power elevations noted on interrogation.   Plan: 1. Decrease Torsemide to 40 mg daily 2. Will call to schedule appointment with Dr. Hollie Salk sooner than 07/24/17. Possible prescription for Aranesp and creatinine now 4.00. 3. Intermacs at next visit. 4. Carcinoma removal scheduled for 8/23. Verified with surgery center that patient does not need to reduce INR or hold aspirin for procedure.   Balinda Quails RN VAD Coordinator   Office: [phone] 24/7 Emergency VAD Pager: [phone]     Trevor Fessel Murphyis a 71 y.o.malewith a history of CAD s/p CABG x 4 2010, OSA, AS with TAVR 2015, CKD, DM2, paroxysmal atrial fibrillation, asthma, and ischemic cardiomyopathy with Medtronic ICD.   Relocated to [facility]  from [place] in early 2018. Over the last couple of years, he had been admitted multiple times for CHF. Last admission in [place] was in 1/18. Sounds like the hospitalization was complicated by cardiorenal  syndrome and there was consideration of doing dialysis.  He was admitted in to [facility] 11/27/16 with NYHA class IV symptoms and hypotension. He was placed on dual inotropes to facilitate diuresis, cardiac output and renal function.  CT surgery consulted for mechanical support and he was deemed appropriate for LVAD work up. He completed LVAD work up and was approved for HMII LVAD --> DT. On 12/11/16, he had IABP placed to optimize cardiac output and improve renal function. He then underwent HMII LVAD placement + tricuspid valve repair on 12/14/2015. Post operatively pressors weaned off slowly. He required significant diuresis.  Course was complicated by RV dysfunction and renal dysfunction.  Patient was admitted with lower GI bleeding in 4/18.  He had been on ASA 325 (increased with mild LDH elevation) and warfarin INR 2-2.5.  He had 4 units PRBCs total.  Colonoscopy showed colonic AVM treated with APC and clipping.  He was sent home off ASA and on warfarin INR goal 2-2.5.   Recently, creatinine was noted to increase as high as 3.67.  I cut back on his diuretics but he became volume overloaded.  I increased diuretics again with poor response.  In 5/18, he had RHC showing elevated right and left heart filling pressures and preserved cardiac output. I admitted him for IV diuresis.  Weight and creatinine came down, creatinine was 2.9 when discharged.  Ramp echo was done and speed was increased to 9200 rpm.  Sildenafil was increased to 40 mg tid.  At last visit, speed was increased to 9400 rpm and torsemide was decreased to 40 mg daily, later increased back to 60 mg daily with increased volume.   He has no complaints today.  Weight is lower.  He is doing cardiac rehab, gets short of breath after walking about 3 times around track.  No dyspnea walking around his house.  No lightheadedness.  MAP stable in 70s.  LVAD parameters normal.  Creatinine higher today at 4.  No melena/BRBPR but did get 1 unit of PRBCs last week  with low hemoglobin.   He has been noted to have a skin cancer on his neck, will need this removed.  He should be able to continue his coumadin.   Labs (3/18): LDH 331 => 401 => 443 => 366 => 313, K 3.6 => 3.2 => 3.5, creatinine 1.98 => 1.74 => 1.8 => 1.79, hgb 8.3 => 8.9 => 8.7 => 8.4, INR 2.11 => 2.4 Labs (4/18): hgb 9.4 Labs (5/18): LDH 269 => 249, INR 2.57, K 4.5, creatinine 1.92 => 2.71 => 3.67 => 3.43 => 3.1 => 2.93, hgb 9, plts 104 Labs (6/18): K 4.5, creatinine 3.6 => 3.43, hgb 8.5, INR 2.4, LDH 287\ Labs (7/18): K 4.5, creatinine 3.52 => 4, hgb 7.9 => 8.5  LVAD parameters: See nurse's note above.  I reviewed.   PMH: 1. CAD: s/p CABG in 2010.  2. Aortic stenosis: s/p TAVR in 2015. Valve looked ok on 2/18 echo.  3. Atrial fibrillation: Chronic.  4. Type II diabetes.  5. CKD: Stage III-IV, cardiorenal syndrome.  6. Chronic systolic CHF: Ischemic cardiomyopathy with prominent RV dysfunction.  Medtronic ICD.  - Echo (2/18): EF 20-25%, moderately dilated LV, moderately dilated/moderately dysfunctional RV, severe TR.  - Cardiogenic shock 2/18 requiring milrinone,  norepinephrine, and IABP.  - Heartmate II LVAD for DT placed 12/13/16.   - RHC (5/18): mean RA 16, PA 55/22 mean 33, mean PCWP 23, CI 2.59, PVR 3.87 7. OSA.  8. Thrombocytopenia: Post-op LVAD, resolved.  9. Tricuspid regurgitation: Severe, s/p repair with LVAD placement in 2/18.  10. Lower GI bleed (4/18): Colonic AVM, treated with APC and clipping.  11. Psoriasis  Social History   Social History  . Marital status: Widowed    Spouse name: N/A  . Number of children: 0  . Years of education: 14   Occupational History  . Not on file.   Social History Main Topics  . Smoking status: Never Smoker  . Smokeless tobacco: Never Used  . Alcohol use No  . Drug use: No  . Sexual activity: Not Currently   Other Topics Concern  . Not on file   Social History Narrative   Patient is a widow. Moved to [place] from [place], [place]. Currently lives with his sister Trevor [name].    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    Family History  Problem Relation Age of Onset  . Heart failure Father   . Heart attack Father   . Healthy Mother   . Diabetes Paternal Grandfather    ROS: All systems reviewed and negative except as per HPI.   Current Outpatient Prescriptions  Medication Sig Dispense Refill  . albuterol (PROVENTIL HFA;VENTOLIN HFA) 108 (90 Base) MCG/ACT inhaler Inhale 2 puffs into the lungs every 4 (four) hours as needed for wheezing or shortness of breath. 1 Inhaler 5  . amLODipine (NORVASC) 5 MG tablet Take 1 tablet (5 mg total) by mouth daily. 30 tablet 6  . ascorbic acid (VITAMIN C) 1000 MG tablet Take 1,000 mg by mouth daily at 12 noon.     Marland Kitchen atorvastatin (LIPITOR) 40 MG tablet Take 1 tablet (40 mg total) by mouth daily. 30 tablet 6  . busPIRone (BUSPAR) 5 MG tablet Take 1 tablet (5 mg total) by mouth 2 (two) times daily. 60 tablet 6  . docusate sodium (COLACE) 100 MG capsule Take 1 capsule (100 mg total) by mouth 2 (two) times daily. 60 capsule 12  . fluticasone (FLOVENT HFA) 110 MCG/ACT inhaler Inhale 2 puffs into the lungs 2 (two) times daily. 1 Inhaler 5  . gabapentin (NEURONTIN) 600 MG tablet Take 0.5 tablets (300 mg total) by mouth 2 (two) times daily. 60 tablet 6  . insulin glargine (LANTUS) 100 unit/mL SOPN Inject 0.2 mLs (20 Units total) into the skin at bedtime. 15 mL 0  . levothyroxine (SYNTHROID, LEVOTHROID) 25 MCG tablet Take 1 tablet (25 mcg total) by mouth daily before breakfast. 30 tablet 6  . metolazone (ZAROXOLYN) 2.5 MG tablet Take 1 tablet (2.5 mg total) by mouth once a week. 10 tablet 3  . Multiple Vitamin (MULTIVITAMIN WITH MINERALS) TABS tablet Take 1 tablet by mouth daily at 12 noon.    Marland Kitchen octreotide (SANDOSTATIN LAR) 20 MG injection Inject 20 mg into the muscle every 28 (twenty-eight) days. 1 each 2  . pantoprazole (PROTONIX) 40 MG tablet Take 1 tablet (40 mg  total) by mouth daily at 12 noon. 30 tablet 6  . potassium chloride SA (K-DUR,KLOR-CON) 20 MEQ tablet TAKE 2 TABLETS BY MOUTH DAILY WITH AN ADDITIONAL 1 TABLET ONCE WEEKLY ON METOLAZONE DAYS 182 tablet 3  . sildenafil (REVATIO) 20 MG tablet Take 2 tablets (40 mg total) by mouth 3 (three) times daily. 180 tablet  6  . torsemide (DEMADEX) 20 MG tablet Take 2 tablets (40 mg total) by mouth daily. 90 tablet 5  . traMADol (ULTRAM) 50 MG tablet TAKE 1 TABLET BY MOUTH EVERY 6 HOURS AS NEEDED FOR MODERATE PAIN 30 tablet 2  . traZODone (DESYREL) 50 MG tablet TAKE 1 TABLET(50 MG) BY MOUTH AT BEDTIME AS NEEDED FOR SLEEP 30 tablet 0  . warfarin (COUMADIN) 5 MG tablet Take 7.5 mg (1 and 1/2 tablets) daily except 5 mg (1 tablet) on Tues and Thurs.    Marland Kitchen albuterol (PROVENTIL) (2.5 MG/3ML) 0.083% nebulizer solution Take 3 mLs (2.5 mg total) by nebulization every 4 (four) hours. And as needed (Patient not taking: Reported on 06/03/2017) 150 mL 5  . benzonatate (TESSALON) 100 MG capsule Take 1 capsule (100 mg total) by mouth 3 (three) times daily as needed for cough. (Patient not taking: Reported on 06/03/2017) 20 capsule 6   No current facility-administered medications for this encounter.    Facility-Administered Medications Ordered in Other Encounters  Medication Dose Route Frequency Provider Last Rate Last Dose  . 0.9 %  sodium chloride infusion   Intravenous Once Larey Dresser, MD       BP 94/66   Pulse 75   Resp 16   Ht 5\' 5"  (1.651 m)   Wt 185 lb (83.9 kg)   SpO2 98%   BMI 30.79 kg/m   MAP 70s General: NAD Neck: JVP 9-10 cm, no thyromegaly or thyroid nodule.  Lungs: CTAB  CV: LVAD hum. 1+ ankle edema.  No carotid bruit.   Abdomen: Soft, nontender, no hepatosplenomegaly, no distention.  Skin: Psoriasis lesions.  Neurologic: Alert and oriented x 3.  Psych: Normal affect. Extremities: No clubbing or cyanosis.  HEENT: Normal.   Assessment/Plan: 1. Chronic systolic CHF: Ischemic cardiomyopathy, EF  20-25% with RV dysfunction on 2/18 echo pre-LVAD.  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  Medtronic ICD.  Overall, he has felt much better post-LVAD.  However, he developed AK and was eventually admitted in 5/18 after RHC showed elevated filling pressures.  He was diuresed and creatinine came down. Creatinine rose again and torsemide decreased to 40 mg daily with speed up to 9400. At last visit, torsemide was increased ot 60 mg daily.  NYHA class IIIa symptoms.  Volume stable (baseline mildly volume elevated, RV failure).  However, creatinine today is up to 4.  - Decrease torsemide to 40 mg daily. Can continue on metolazone once a week.  Keep sodium intake minimal.    - Continue warfarin, no ASA with recent GI bleeding.  - Continue cardiac rehab.     2. CAD: s/p CABG.  No chest pain.  He is on atorvastatin.  3. CKD stage III-IV:  Creatinine continues to fluctuate up and down.  Creatinine up to 4 today, will cut back on torsemide and repeat BMET 1 week.  4. Atrial fibrillation: Chronic.  On warfarin.  5. RV failure: He is on Revatio, now increased to 40 mg tid.   6. HTN: MAP controlled.    7. Anemia: Baseline anemia of renal disease/chronic disease but recent bleeding from colonic AVMs. He got 1 unit PRBCs last week.  Hopefully renal will start him on Aranesp at next visit.  8. 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  - Continue octreotide injections.  9. Skin cancer: On neck.  Needs excision, does not have to stop warfarin.   Loralie Champagne 06/03/2017

## 2017-06-04 ENCOUNTER — Encounter (HOSPITAL_COMMUNITY)
Admission: RE | Admit: 2017-06-04 | Discharge: 2017-06-04 | Disposition: A | Discharge status: Home / Self Care | Payer: Medicare Other | Source: Ambulatory Visit | Attending: Cardiology | Admitting: Cardiology

## 2017-06-04 ENCOUNTER — Telehealth (HOSPITAL_COMMUNITY): Payer: Self-pay | Admitting: *Deleted

## 2017-06-04 DIAGNOSIS — Z95811 Presence of heart assist device: Secondary | ICD-10-CM | POA: Diagnosis present

## 2017-06-04 DIAGNOSIS — I5022 Chronic systolic (congestive) heart failure: Secondary | ICD-10-CM

## 2017-06-04 NOTE — Telephone Encounter (Signed)
Called Dr. Bishop Dublin office [facility]) per [name] and spoke with Catalina Antigua (dr. Bishop Dublin triage clinic assistant) asking for earlier appointment. Pt currently has appt on 07/24/17 with Dr. Hollie Salk. Per Catalina Antigua, pt has been approved and should be scheduled within next 2 weeks. [facility] will contact patient with that appointment date and time.

## 2017-06-05 ENCOUNTER — Ambulatory Visit (HOSPITAL_COMMUNITY)
Admission: RE | Admit: 2017-06-05 | Discharge: 2017-06-05 | Disposition: A | Discharge status: Home / Self Care | Payer: Medicare Other | Source: Ambulatory Visit | Attending: Cardiology | Admitting: Cardiology

## 2017-06-05 ENCOUNTER — Other Ambulatory Visit (HOSPITAL_COMMUNITY): Payer: Medicare Other

## 2017-06-05 DIAGNOSIS — K5521 Angiodysplasia of colon with hemorrhage: Secondary | ICD-10-CM | POA: Diagnosis present

## 2017-06-05 MED ORDER — OCTREOTIDE ACETATE 20 MG IM KIT
20.0000 mg | PACK | Freq: Once | INTRAMUSCULAR | Status: AC
  Administered 2017-06-05: 20 mg via INTRAMUSCULAR
  Filled 2017-06-05: qty 1

## 2017-06-06 ENCOUNTER — Encounter (HOSPITAL_COMMUNITY)
Admission: RE | Admit: 2017-06-06 | Discharge: 2017-06-06 | Disposition: A | Discharge status: Home / Self Care | Payer: Medicare Other | Source: Ambulatory Visit | Attending: Cardiology | Admitting: Cardiology

## 2017-06-06 DIAGNOSIS — Z95811 Presence of heart assist device: Secondary | ICD-10-CM | POA: Diagnosis not present

## 2017-06-06 DIAGNOSIS — I5022 Chronic systolic (congestive) heart failure: Secondary | ICD-10-CM

## 2017-06-09 ENCOUNTER — Ambulatory Visit (HOSPITAL_COMMUNITY)
Admission: RE | Admit: 2017-06-09 | Discharge: 2017-06-09 | Disposition: A | Discharge status: Home / Self Care | Payer: Medicare Other | Source: Ambulatory Visit | Attending: Internal Medicine | Admitting: Internal Medicine

## 2017-06-09 ENCOUNTER — Other Ambulatory Visit (HOSPITAL_COMMUNITY): Payer: Self-pay | Admitting: Unknown Physician Specialty

## 2017-06-09 ENCOUNTER — Ambulatory Visit (HOSPITAL_COMMUNITY): Payer: Self-pay | Admitting: Pharmacist

## 2017-06-09 ENCOUNTER — Encounter (HOSPITAL_COMMUNITY)
Admission: RE | Admit: 2017-06-09 | Discharge: 2017-06-09 | Disposition: A | Discharge status: Home / Self Care | Payer: Medicare Other | Source: Ambulatory Visit | Attending: Cardiology | Admitting: Cardiology

## 2017-06-09 VITALS — BP 113/79 | HR 64 | Ht 65.0 in | Wt 197.0 lb

## 2017-06-09 VITALS — Wt 197.3 lb

## 2017-06-09 DIAGNOSIS — Z95811 Presence of heart assist device: Secondary | ICD-10-CM

## 2017-06-09 DIAGNOSIS — Z951 Presence of aortocoronary bypass graft: Secondary | ICD-10-CM | POA: Diagnosis not present

## 2017-06-09 DIAGNOSIS — D631 Anemia in chronic kidney disease: Secondary | ICD-10-CM | POA: Insufficient documentation

## 2017-06-09 DIAGNOSIS — I255 Ischemic cardiomyopathy: Secondary | ICD-10-CM | POA: Insufficient documentation

## 2017-06-09 DIAGNOSIS — I509 Heart failure, unspecified: Secondary | ICD-10-CM | POA: Diagnosis not present

## 2017-06-09 DIAGNOSIS — C4440 Unspecified malignant neoplasm of skin of scalp and neck: Secondary | ICD-10-CM | POA: Insufficient documentation

## 2017-06-09 DIAGNOSIS — L409 Psoriasis, unspecified: Secondary | ICD-10-CM | POA: Insufficient documentation

## 2017-06-09 DIAGNOSIS — E1122 Type 2 diabetes mellitus with diabetic chronic kidney disease: Secondary | ICD-10-CM | POA: Diagnosis not present

## 2017-06-09 DIAGNOSIS — I482 Chronic atrial fibrillation: Secondary | ICD-10-CM | POA: Diagnosis not present

## 2017-06-09 DIAGNOSIS — Z794 Long term (current) use of insulin: Secondary | ICD-10-CM | POA: Diagnosis not present

## 2017-06-09 DIAGNOSIS — R946 Abnormal results of thyroid function studies: Secondary | ICD-10-CM

## 2017-06-09 DIAGNOSIS — Z7901 Long term (current) use of anticoagulants: Secondary | ICD-10-CM

## 2017-06-09 DIAGNOSIS — I130 Hypertensive heart and chronic kidney disease with heart failure and stage 1 through stage 4 chronic kidney disease, or unspecified chronic kidney disease: Secondary | ICD-10-CM | POA: Insufficient documentation

## 2017-06-09 DIAGNOSIS — R7989 Other specified abnormal findings of blood chemistry: Secondary | ICD-10-CM

## 2017-06-09 DIAGNOSIS — N184 Chronic kidney disease, stage 4 (severe): Secondary | ICD-10-CM | POA: Diagnosis not present

## 2017-06-09 DIAGNOSIS — Z8249 Family history of ischemic heart disease and other diseases of the circulatory system: Secondary | ICD-10-CM | POA: Diagnosis not present

## 2017-06-09 DIAGNOSIS — J45909 Unspecified asthma, uncomplicated: Secondary | ICD-10-CM | POA: Insufficient documentation

## 2017-06-09 DIAGNOSIS — I50810 Right heart failure, unspecified: Secondary | ICD-10-CM

## 2017-06-09 DIAGNOSIS — I5043 Acute on chronic combined systolic (congestive) and diastolic (congestive) heart failure: Secondary | ICD-10-CM

## 2017-06-09 DIAGNOSIS — I5022 Chronic systolic (congestive) heart failure: Secondary | ICD-10-CM | POA: Diagnosis present

## 2017-06-09 DIAGNOSIS — I2510 Atherosclerotic heart disease of native coronary artery without angina pectoris: Secondary | ICD-10-CM | POA: Insufficient documentation

## 2017-06-09 DIAGNOSIS — G4733 Obstructive sleep apnea (adult) (pediatric): Secondary | ICD-10-CM | POA: Insufficient documentation

## 2017-06-09 DIAGNOSIS — Z79899 Other long term (current) drug therapy: Secondary | ICD-10-CM | POA: Diagnosis not present

## 2017-06-09 LAB — CBC
HEMATOCRIT: 23.3 % — AB (ref 39.0–52.0)
Hemoglobin: 7.9 g/dL — ABNORMAL LOW (ref 13.0–17.0)
MCH: 30.9 pg (ref 26.0–34.0)
MCHC: 33.9 g/dL (ref 30.0–36.0)
MCV: 91 fL (ref 78.0–100.0)
Platelets: 112 10*3/uL — ABNORMAL LOW (ref 150–400)
RBC: 2.56 MIL/uL — ABNORMAL LOW (ref 4.22–5.81)
RDW: 15.5 % (ref 11.5–15.5)
WBC: 10.3 10*3/uL (ref 4.0–10.5)

## 2017-06-09 LAB — PROTIME-INR
INR: 3.22
Prothrombin Time: 33.6 seconds — ABNORMAL HIGH (ref 11.4–15.2)

## 2017-06-09 LAB — COMPREHENSIVE METABOLIC PANEL
ALK PHOS: 99 U/L (ref 38–126)
ALT: 23 U/L (ref 17–63)
ANION GAP: 14 (ref 5–15)
AST: 28 U/L (ref 15–41)
Albumin: 4 g/dL (ref 3.5–5.0)
BILIRUBIN TOTAL: 1 mg/dL (ref 0.3–1.2)
BUN: 81 mg/dL — ABNORMAL HIGH (ref 6–20)
CALCIUM: 8.6 mg/dL — AB (ref 8.9–10.3)
CO2: 23 mmol/L (ref 22–32)
CREATININE: 4.79 mg/dL — AB (ref 0.61–1.24)
Chloride: 95 mmol/L — ABNORMAL LOW (ref 101–111)
GFR, EST AFRICAN AMERICAN: 13 mL/min — AB (ref >60)
GFR, EST NON AFRICAN AMERICAN: 11 mL/min — AB (ref >60)
Glucose, Bld: 199 mg/dL — ABNORMAL HIGH (ref 65–99)
Potassium: 4.1 mmol/L (ref 3.5–5.1)
SODIUM: 132 mmol/L — AB (ref 135–145)
TOTAL PROTEIN: 7.8 g/dL (ref 6.5–8.1)

## 2017-06-09 LAB — TSH: TSH: 0.694 u[IU]/mL (ref 0.350–4.500)

## 2017-06-09 LAB — LACTATE DEHYDROGENASE: LDH: 314 U/L — AB (ref 98–192)

## 2017-06-09 NOTE — Progress Notes (Signed)
Daily Session Note  Patient Details  Name: Trevor [name] MRN: [id number] Date of Birth: [birth year] Referring Provider:     CARDIAC REHAB PHASE II ORIENTATION from 04/03/2017 in [place]  Referring Provider  Loralie Champagne MD      Encounter Date: 06/09/2017  Check In:   Capillary Blood Glucose: No results found for this or any previous visit (from the past 24 hour(s)).    History  Smoking Status  . Never Smoker  Smokeless Tobacco  . Never Used    Goals Met:  No report of cardiac concerns or symptoms  Goals Unmet:  weight  Comments: Trevor [name] weight is at 89.5kg this morning. Trevor [name] weight on Friday was 84.1 kg. [name] reports feeling somewhat short of breath today. Upon assessment lung fields slightly diminished left base. Positive abdominal distention. Oxygen saturation 98% on room air. Trevor [name] torsemide was decreased last week due to his kidney function. [name] bilateral  Lower extremity and ankle edema. [name] LVAD coordinator called and notified. [name] exercised on the arm ergometer and the nustep and then went to the heart failure clinic for further evaluation after class today.Barnet Pall, RN,BSN 06/09/2017 4:26 PM   Dr. Fransico Him is Medical Director for Cardiac Rehab at [facility].

## 2017-06-09 NOTE — Progress Notes (Addendum)
Patient presents for sick visit in [facility] today. Reports no problems with VAD equipment or concerns with drive line. Cardiac rehab called today to say that pt is 10lbs up and increasingly short of breath. Demadex was decreased in clinic last week.   Vital Signs:  Doppler Pressure 88  Automatc BP: 113/79 (91) HR:68 SPO2:96  %  Weight: 197 lb w/o eqt Last weight: 185 lb Home weights: 180 lbs   VAD Indication: Destination therapy - age excluding  VAD interrogation & Equipment Management (reviewed with Dr.Lilyanna Lunt): Speed:9400 Flow: 5.8 Power: 6.1 w    PI: 5.4  Alarms: no clinical alarms Events: rare  Fixed speed 9400 Low speed limit: 8800  Primary Controller:  Replace back up battery in 29months. Back up controller:   Replace back up battery in 16 months.  Annual Equipment Maintenance on UBC/PM was performed on 12/2016.   I reviewed the LVAD parameters from today and compared the results to the patient's prior recorded data. LVAD interrogation was NEGATIVE for significant power changes, NEGATIVE for clinical alarms and STABLE for PI events/speed drops. No programming changes were made and pump is functioning within specified parameters. Pt is performing daily controller and system monitor self tests along with completing weekly and monthly maintenance for LVAD equipment.  LVAD equipment check completed and is in good working order. Back-up equipment present. Charged back up battery and performed self-test on equipment.   Exit Site Care: Drive line is being once weekly by VAD Coordinators. Caregiver [name] -sister) is currently unavailable. Gentle window dressing not sticking, pt had taped down one edge; dressing removed and site care performed using sterile technique. Drive line exit site cleaned with Chlora prep applicators x 2, rinsed with saline, allowed to dry, and biopatch disc applied, topped with antimicrobial sponge and gentle window dressing. Skin protectant  applied before placement. Dressing then covered with 2 large Tegaderm dressing. Exit site healed and incorporated, the velour is fully implanted at exit site. Redness noted around exit site, chronic in nature. Small amount brown drainage noted with no tenderness or foul odor noted. Psoriasis rash noted around dressing site and under foley anchor attachment device. Centurion foley anchor attachment device re-applied. Pt denies fever or chills.    Significant Events on VAD Support:  02/2017> GIB- AVM clipped x2  Device:Medtronic single lead Therapies: on at 231 bpm Last check: 05/19/17   BP & Labs:  MAP 88 - Doppler is reflecting modified systolic  Hgb 7.9- No S/S of bleeding. Specifically denies melena/BRBPR or nosebleeds.  LDH stable at 314 with established baseline of 225- 300. Denies tea-colored urine. No power elevations noted on interrogation.   6 mo Intermacs follow up completed including:  Quality of Life, KCCQ-12, and Neurocognitive trail making.   Pt deferred 6 minute walk due to SOB and increase in weight due to volume overload.  Back up controller:  11V backup battery charged during this visit.  Plan: 1. Take an extra 40 mg of Demadex today. 2. Increase Demadex to 60 mg daily. 3. Take an extra Metolazone Tuesday and Wednesday. 4. Return to clinic in 1 week for dressing change and labs.   [name] VAD Coordinator   Office: [phone] 24/7 Emergency VAD Pager: [phone]     Trevor Corkery Murphyis a 71 y.o.malewith a history of CAD s/p CABG x 4 2010, OSA, AS with TAVR 2015, CKD, DM2, paroxysmal atrial fibrillation, asthma, and ischemic cardiomyopathy with Medtronic ICD.   Relocated to [facility] from [facility]  [place] in early 2018. Over the last couple of years, he had been admitted multiple times for CHF. Last admission in [place] was in 1/18. Sounds like the hospitalization was complicated by cardiorenal syndrome and there was consideration of doing  dialysis.  He was admitted in to [facility] 11/27/16 with NYHA class IV symptoms and hypotension. He was placed on dual inotropes to facilitate diuresis, cardiac output and renal function.  CT surgery consulted for mechanical support and he was deemed appropriate for LVAD work up. He completed LVAD work up and was approved for HMII LVAD --> DT. On 12/11/16, he had IABP placed to optimize cardiac output and improve renal function. He then underwent HMII LVAD placement + tricuspid valve repair on 12/14/2015. Post operatively pressors weaned off slowly. He required significant diuresis.  Course was complicated by RV dysfunction and renal dysfunction.  Patient was admitted with lower GI bleeding in 4/18.  He had been on ASA 325 (increased with mild LDH elevation) and warfarin INR 2-2.5.  He had 4 units PRBCs total.  Colonoscopy showed colonic AVM treated with APC and clipping.  He was sent home off ASA and on warfarin INR goal 2-2.5.   In 5/18, creatinine was noted to increase as high as 3.67.  I cut back on his diuretics but he became volume overloaded.  I increased diuretics again with poor response.  In 5/18, he had RHC showing elevated right and left heart filling pressures and preserved cardiac output. I admitted him for IV diuresis.  Weight and creatinine came down, creatinine was 2.9 when discharged.  Ramp echo was done and speed was increased to 9200 rpm.  Sildenafil was increased to 40 mg tid.  At last visit, speed was increased to 9400 rpm and torsemide was decreased to 40 mg daily, later increased back to 60 mg daily with increased volume.   Recently, creatinine increased to 4 and torsemide was again cut back to 40 mg daily.  Once again, he has developed volume overload.  Weight is up 12 lbs and he is short of breath with moderate exertion.  He has developed peripheral edema.  No BRBPR/melena.   He has been noted to have a skin cancer on his neck, will need this removed.  He should be able to continue his  coumadin.   Labs (3/18): LDH 331 => 401 => 443 => 366 => 313, K 3.6 => 3.2 => 3.5, creatinine 1.98 => 1.74 => 1.8 => 1.79, hgb 8.3 => 8.9 => 8.7 => 8.4, INR 2.11 => 2.4 Labs (4/18): hgb 9.4 Labs (5/18): LDH 269 => 249, INR 2.57, K 4.5, creatinine 1.92 => 2.71 => 3.67 => 3.43 => 3.1 => 2.93, hgb 9, plts 104 Labs (6/18): K 4.5, creatinine 3.6 => 3.43, hgb 8.5, INR 2.4, LDH 287\ Labs (7/18): K 4.5, creatinine 3.52 => 4, hgb 7.9 => 8.5 Labs (8/18): K 3.8, creatinine 4, hgb 8.5  LVAD parameters: See nurse's note above.  I reviewed.   PMH: 1. CAD: s/p CABG in 2010.  2. Aortic stenosis: s/p TAVR in 2015. Valve looked ok on 2/18 echo.  3. Atrial fibrillation: Chronic.  4. Type II diabetes.  5. CKD: Stage IV, cardiorenal syndrome.  6. Chronic systolic CHF: Ischemic cardiomyopathy with prominent RV dysfunction.  Medtronic ICD.  - Echo (2/18): EF 20-25%, moderately dilated LV, moderately dilated/moderately dysfunctional RV, severe TR.  - Cardiogenic shock 2/18 requiring milrinone, norepinephrine, and IABP.  - Heartmate II LVAD for DT placed 12/13/16.   -  RHC (5/18): mean RA 16, PA 55/22 mean 33, mean PCWP 23, CI 2.59, PVR 3.87 7. OSA.  8. Thrombocytopenia: Post-op LVAD, resolved.  9. Tricuspid regurgitation: Severe, s/p repair with LVAD placement in 2/18.  10. Lower GI bleed (4/18): Colonic AVM, treated with APC and clipping.  11. Psoriasis  Social History   Social History  . Marital status: Widowed    Spouse name: N/A  . Number of children: 0  . Years of education: 14   Occupational History  . Not on file.   Social History Main Topics  . Smoking status: Never Smoker  . Smokeless tobacco: Never Used  . Alcohol use No  . Drug use: No  . Sexual activity: Not Currently   Other Topics Concern  . Not on file   Social History Narrative   Patient is a widow. Moved to [place] from [place], [place]. Currently lives with his sister Trevor [name].    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    Family History  Problem Relation Age of Onset  . Heart failure Father   . Heart attack Father   . Healthy Mother   . Diabetes Paternal Grandfather    ROS: All systems reviewed and negative except as per HPI.   Current Outpatient Prescriptions  Medication Sig Dispense Refill  . albuterol (PROVENTIL HFA;VENTOLIN HFA) 108 (90 Base) MCG/ACT inhaler Inhale 2 puffs into the lungs every 4 (four) hours as needed for wheezing or shortness of breath. 1 Inhaler 5  . amLODipine (NORVASC) 5 MG tablet Take 1 tablet (5 mg total) by mouth daily. 30 tablet 6  . ascorbic acid (VITAMIN C) 1000 MG tablet Take 1,000 mg by mouth daily at 12 noon.     Marland Kitchen atorvastatin (LIPITOR) 40 MG tablet Take 1 tablet (40 mg total) by mouth daily. 30 tablet 6  . busPIRone (BUSPAR) 5 MG tablet Take 1 tablet (5 mg total) by mouth 2 (two) times daily. 60 tablet 6  . docusate sodium (COLACE) 100 MG capsule Take 1 capsule (100 mg total) by mouth 2 (two) times daily. 60 capsule 12  . fluticasone (FLOVENT HFA) 110 MCG/ACT inhaler Inhale 2 puffs into the lungs 2 (two) times daily. 1 Inhaler 5  . gabapentin (NEURONTIN) 600 MG tablet Take 0.5 tablets (300 mg total) by mouth 2 (two) times daily. 60 tablet 6  . insulin glargine (LANTUS) 100 unit/mL SOPN Inject 0.2 mLs (20 Units total) into the skin at bedtime. 15 mL 0  . levothyroxine (SYNTHROID, LEVOTHROID) 25 MCG tablet Take 1 tablet (25 mcg total) by mouth daily before breakfast. 30 tablet 6  . metolazone (ZAROXOLYN) 2.5 MG tablet Take 1 tablet (2.5 mg total) by mouth once a week. 10 tablet 3  . Multiple Vitamin (MULTIVITAMIN WITH MINERALS) TABS tablet Take 1 tablet by mouth daily at 12 noon.    . pantoprazole (PROTONIX) 40 MG tablet Take 1 tablet (40 mg total) by mouth daily at 12 noon. 30 tablet 6  . potassium chloride SA (K-DUR,KLOR-CON) 20 MEQ tablet TAKE 2 TABLETS BY MOUTH DAILY WITH AN ADDITIONAL 1 TABLET ONCE WEEKLY ON METOLAZONE DAYS 182 tablet 3  . sildenafil  (REVATIO) 20 MG tablet Take 2 tablets (40 mg total) by mouth 3 (three) times daily. 180 tablet 6  . torsemide (DEMADEX) 20 MG tablet Take 2 tablets (40 mg total) by mouth daily. 90 tablet 5  . traZODone (DESYREL) 50 MG tablet TAKE 1 TABLET(50 MG) BY MOUTH AT BEDTIME AS  NEEDED FOR SLEEP 30 tablet 0  . warfarin (COUMADIN) 5 MG tablet Take 5 mg daily except 7.5 mg (1 and 1/2 tablet) on Tuesday.    Marland Kitchen albuterol (PROVENTIL) (2.5 MG/3ML) 0.083% nebulizer solution Take 3 mLs (2.5 mg total) by nebulization every 4 (four) hours. And as needed (Patient not taking: Reported on 06/03/2017) 150 mL 5  . benzonatate (TESSALON) 100 MG capsule Take 1 capsule (100 mg total) by mouth 3 (three) times daily as needed for cough. (Patient not taking: Reported on 06/03/2017) 20 capsule 6  . traMADol (ULTRAM) 50 MG tablet TAKE 1 TABLET BY MOUTH EVERY 6 HOURS AS NEEDED FOR MODERATE PAIN 30 tablet 0   No current facility-administered medications for this encounter.    Facility-Administered Medications Ordered in Other Encounters  Medication Dose Route Frequency Provider Last Rate Last Dose  . 0.9 %  sodium chloride infusion   Intravenous Once Larey Dresser, MD       BP 113/79 Comment: 91 map  Pulse 64   Ht 5\' 5"  (1.651 m)   Wt 197 lb (89.4 kg)   SpO2 96%   BMI 32.78 kg/m   MAP 90 General: NAD Neck: JVP 14-16cm, no thyromegaly or thyroid nodule.  Lungs: CTAB  CV: LVAD hum. 2+ edema to knees. No carotid bruit.  Abdomen: Soft, nontender, no hepatosplenomegaly, no distention.  Skin: Psoriasis lesions.  Neurologic: Alert and oriented x 3.  Psych: Normal affect. Extremities: No clubbing or cyanosis.  HEENT: Normal.   Assessment/Plan: 1. Chronic systolic CHF: Ischemic cardiomyopathy, EF 20-25% with RV dysfunction on 2/18 echo pre-LVAD.  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  Medtronic ICD.  Overall, he has felt much better post-LVAD.  However, he developed AK and was eventually admitted in 5/18 after RHC showed elevated  filling pressures.  He was diuresed and creatinine came down. Since then, we have adjusted up and down his torsemide dose, with higher dose volume improves but creatinine worsens.  With lower dose we can make creatinine look better but he gets volume overloaded.  Currently, he is again volume overloaded with NYHA class III symptoms on lower dose torsemide.  I suspect significant RV dysfunction is making volume management difficult.  - Increase torsemide back to 60 mg daily.  Take metolazone on Tuesday and Wednesday of this week then go back to once weekly.  Keep sodium intake minimal.  We may have to accept a higher creatinine.  - Continue warfarin, no ASA with recent GI bleeding.  - Continue cardiac rehab.     - I will arrange for ramp echo to see if we can safely increase VAD speed.  - May need repeat RHC, ?milrinone.  2. CAD: s/p CABG.  No chest pain.  He is on atorvastatin.  3. CKD stage IV:  Creatinine continues to fluctuate up and down with diuretic dose.  As above, think RV failure is complicating. BMET today. 4. Atrial fibrillation: Chronic.  On warfarin.  5. RV failure: He is on Revatio, now increased to 40 mg tid.   6. HTN: MAP controlled.    7. Anemia: Baseline anemia of renal disease/chronic disease but recent bleeding from colonic AVMs.  Hopefully renal will start him on Aranesp at next visit.  - CBC today.  8. 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  - Continue octreotide injections.  9. Skin cancer: On neck.  Needs excision, does not have to stop warfarin.   Loralie Champagne 06/10/2017

## 2017-06-10 ENCOUNTER — Other Ambulatory Visit (HOSPITAL_COMMUNITY): Payer: Self-pay | Admitting: Student

## 2017-06-10 ENCOUNTER — Other Ambulatory Visit (HOSPITAL_COMMUNITY): Payer: Medicare Other

## 2017-06-10 NOTE — Telephone Encounter (Signed)
This needs to go to [place].

## 2017-06-11 ENCOUNTER — Encounter (HOSPITAL_COMMUNITY): Payer: Medicare Other

## 2017-06-11 ENCOUNTER — Ambulatory Visit (HOSPITAL_COMMUNITY): Payer: Self-pay | Admitting: Pharmacist

## 2017-06-11 ENCOUNTER — Other Ambulatory Visit (HOSPITAL_COMMUNITY): Payer: Medicare Other

## 2017-06-11 DIAGNOSIS — Z95811 Presence of heart assist device: Secondary | ICD-10-CM

## 2017-06-11 LAB — POCT INR: INR: 1.8

## 2017-06-12 ENCOUNTER — Other Ambulatory Visit (HOSPITAL_COMMUNITY): Payer: Self-pay | Admitting: Cardiology

## 2017-06-13 ENCOUNTER — Encounter (HOSPITAL_COMMUNITY): Payer: Self-pay

## 2017-06-13 ENCOUNTER — Other Ambulatory Visit (HOSPITAL_COMMUNITY): Payer: Self-pay | Admitting: *Deleted

## 2017-06-13 ENCOUNTER — Ambulatory Visit (HOSPITAL_COMMUNITY)
Admission: RE | Admit: 2017-06-13 | Discharge: 2017-06-13 | Disposition: A | Discharge status: Home / Self Care | Payer: Medicare Other | Source: Ambulatory Visit | Attending: Internal Medicine | Admitting: Internal Medicine

## 2017-06-13 ENCOUNTER — Encounter (HOSPITAL_COMMUNITY)
Admission: AD | Disposition: A | Discharge status: Home Health | Payer: Self-pay | Source: Ambulatory Visit | Attending: Cardiology

## 2017-06-13 ENCOUNTER — Inpatient Hospital Stay (HOSPITAL_COMMUNITY)
Admission: AD | Admit: 2017-06-13 | Discharge: 2017-07-07 | DRG: 264 | Disposition: A | Discharge status: Home Health | Payer: Medicare Other | Source: Ambulatory Visit | Attending: Cardiology | Admitting: Cardiology

## 2017-06-13 ENCOUNTER — Encounter (HOSPITAL_COMMUNITY): Payer: Self-pay | Admitting: *Deleted

## 2017-06-13 ENCOUNTER — Encounter (HOSPITAL_COMMUNITY)
Admission: RE | Admit: 2017-06-13 | Discharge: 2017-06-13 | Disposition: A | Discharge status: Home / Self Care | Payer: Medicare Other | Source: Ambulatory Visit | Attending: Cardiology | Admitting: Cardiology

## 2017-06-13 ENCOUNTER — Inpatient Hospital Stay (HOSPITAL_COMMUNITY): Payer: Medicare Other

## 2017-06-13 VITALS — BP 123/84 | Resp 16 | Ht 65.0 in | Wt 197.6 lb

## 2017-06-13 DIAGNOSIS — G4733 Obstructive sleep apnea (adult) (pediatric): Secondary | ICD-10-CM | POA: Diagnosis present

## 2017-06-13 DIAGNOSIS — I255 Ischemic cardiomyopathy: Secondary | ICD-10-CM | POA: Diagnosis present

## 2017-06-13 DIAGNOSIS — I5022 Chronic systolic (congestive) heart failure: Secondary | ICD-10-CM | POA: Diagnosis not present

## 2017-06-13 DIAGNOSIS — M109 Gout, unspecified: Secondary | ICD-10-CM | POA: Diagnosis present

## 2017-06-13 DIAGNOSIS — E1151 Type 2 diabetes mellitus with diabetic peripheral angiopathy without gangrene: Secondary | ICD-10-CM | POA: Diagnosis present

## 2017-06-13 DIAGNOSIS — K5520 Angiodysplasia of colon without hemorrhage: Secondary | ICD-10-CM

## 2017-06-13 DIAGNOSIS — Z794 Long term (current) use of insulin: Secondary | ICD-10-CM

## 2017-06-13 DIAGNOSIS — Z95811 Presence of heart assist device: Secondary | ICD-10-CM | POA: Diagnosis not present

## 2017-06-13 DIAGNOSIS — I482 Chronic atrial fibrillation: Secondary | ICD-10-CM | POA: Diagnosis present

## 2017-06-13 DIAGNOSIS — E11649 Type 2 diabetes mellitus with hypoglycemia without coma: Secondary | ICD-10-CM | POA: Diagnosis present

## 2017-06-13 DIAGNOSIS — C4440 Unspecified malignant neoplasm of skin of scalp and neck: Secondary | ICD-10-CM | POA: Diagnosis present

## 2017-06-13 DIAGNOSIS — I509 Heart failure, unspecified: Secondary | ICD-10-CM | POA: Diagnosis not present

## 2017-06-13 DIAGNOSIS — E039 Hypothyroidism, unspecified: Secondary | ICD-10-CM | POA: Diagnosis present

## 2017-06-13 DIAGNOSIS — I2510 Atherosclerotic heart disease of native coronary artery without angina pectoris: Secondary | ICD-10-CM | POA: Diagnosis present

## 2017-06-13 DIAGNOSIS — Z79899 Other long term (current) drug therapy: Secondary | ICD-10-CM

## 2017-06-13 DIAGNOSIS — Z01810 Encounter for preprocedural cardiovascular examination: Secondary | ICD-10-CM | POA: Diagnosis not present

## 2017-06-13 DIAGNOSIS — D631 Anemia in chronic kidney disease: Secondary | ICD-10-CM | POA: Diagnosis present

## 2017-06-13 DIAGNOSIS — E876 Hypokalemia: Secondary | ICD-10-CM | POA: Diagnosis not present

## 2017-06-13 DIAGNOSIS — E1122 Type 2 diabetes mellitus with diabetic chronic kidney disease: Secondary | ICD-10-CM | POA: Diagnosis present

## 2017-06-13 DIAGNOSIS — D696 Thrombocytopenia, unspecified: Secondary | ICD-10-CM

## 2017-06-13 DIAGNOSIS — N170 Acute kidney failure with tubular necrosis: Secondary | ICD-10-CM | POA: Diagnosis not present

## 2017-06-13 DIAGNOSIS — N186 End stage renal disease: Secondary | ICD-10-CM | POA: Diagnosis present

## 2017-06-13 DIAGNOSIS — N185 Chronic kidney disease, stage 5: Secondary | ICD-10-CM | POA: Diagnosis not present

## 2017-06-13 DIAGNOSIS — Z992 Dependence on renal dialysis: Secondary | ICD-10-CM | POA: Diagnosis not present

## 2017-06-13 DIAGNOSIS — I5082 Biventricular heart failure: Secondary | ICD-10-CM | POA: Diagnosis present

## 2017-06-13 DIAGNOSIS — Z9889 Other specified postprocedural states: Secondary | ICD-10-CM

## 2017-06-13 DIAGNOSIS — Z951 Presence of aortocoronary bypass graft: Secondary | ICD-10-CM | POA: Diagnosis not present

## 2017-06-13 DIAGNOSIS — R0602 Shortness of breath: Secondary | ICD-10-CM | POA: Diagnosis present

## 2017-06-13 DIAGNOSIS — E1140 Type 2 diabetes mellitus with diabetic neuropathy, unspecified: Secondary | ICD-10-CM | POA: Diagnosis present

## 2017-06-13 DIAGNOSIS — N2581 Secondary hyperparathyroidism of renal origin: Secondary | ICD-10-CM | POA: Diagnosis present

## 2017-06-13 DIAGNOSIS — Z95810 Presence of automatic (implantable) cardiac defibrillator: Secondary | ICD-10-CM

## 2017-06-13 DIAGNOSIS — N179 Acute kidney failure, unspecified: Secondary | ICD-10-CM | POA: Diagnosis present

## 2017-06-13 DIAGNOSIS — I5043 Acute on chronic combined systolic (congestive) and diastolic (congestive) heart failure: Secondary | ICD-10-CM | POA: Diagnosis not present

## 2017-06-13 DIAGNOSIS — Z6833 Body mass index (BMI) 33.0-33.9, adult: Secondary | ICD-10-CM

## 2017-06-13 DIAGNOSIS — C44702 Unspecified malignant neoplasm of skin of right lower limb, including hip: Secondary | ICD-10-CM | POA: Diagnosis present

## 2017-06-13 DIAGNOSIS — Z09 Encounter for follow-up examination after completed treatment for conditions other than malignant neoplasm: Secondary | ICD-10-CM

## 2017-06-13 DIAGNOSIS — L409 Psoriasis, unspecified: Secondary | ICD-10-CM | POA: Diagnosis present

## 2017-06-13 DIAGNOSIS — Z952 Presence of prosthetic heart valve: Secondary | ICD-10-CM | POA: Diagnosis not present

## 2017-06-13 DIAGNOSIS — M7981 Nontraumatic hematoma of soft tissue: Secondary | ICD-10-CM | POA: Diagnosis not present

## 2017-06-13 DIAGNOSIS — Z7901 Long term (current) use of anticoagulants: Secondary | ICD-10-CM

## 2017-06-13 DIAGNOSIS — J449 Chronic obstructive pulmonary disease, unspecified: Secondary | ICD-10-CM | POA: Diagnosis present

## 2017-06-13 DIAGNOSIS — D638 Anemia in other chronic diseases classified elsewhere: Secondary | ICD-10-CM | POA: Diagnosis present

## 2017-06-13 DIAGNOSIS — D6959 Other secondary thrombocytopenia: Secondary | ICD-10-CM | POA: Diagnosis present

## 2017-06-13 DIAGNOSIS — I5023 Acute on chronic systolic (congestive) heart failure: Secondary | ICD-10-CM | POA: Diagnosis present

## 2017-06-13 DIAGNOSIS — E785 Hyperlipidemia, unspecified: Secondary | ICD-10-CM | POA: Diagnosis present

## 2017-06-13 DIAGNOSIS — E119 Type 2 diabetes mellitus without complications: Secondary | ICD-10-CM

## 2017-06-13 DIAGNOSIS — E669 Obesity, unspecified: Secondary | ICD-10-CM | POA: Diagnosis present

## 2017-06-13 DIAGNOSIS — I4821 Permanent atrial fibrillation: Secondary | ICD-10-CM

## 2017-06-13 DIAGNOSIS — E871 Hypo-osmolality and hyponatremia: Secondary | ICD-10-CM | POA: Diagnosis present

## 2017-06-13 DIAGNOSIS — I132 Hypertensive heart and chronic kidney disease with heart failure and with stage 5 chronic kidney disease, or end stage renal disease: Principal | ICD-10-CM | POA: Diagnosis present

## 2017-06-13 HISTORY — DX: Angiodysplasia of colon without hemorrhage: K55.20

## 2017-06-13 HISTORY — DX: End stage renal disease: N18.6

## 2017-06-13 HISTORY — DX: Permanent atrial fibrillation: I48.21

## 2017-06-13 HISTORY — PX: RIGHT HEART CATH: CATH118263

## 2017-06-13 LAB — COMPREHENSIVE METABOLIC PANEL
ALK PHOS: 102 U/L (ref 38–126)
ALT: 23 U/L (ref 17–63)
AST: 28 U/L (ref 15–41)
Albumin: 3.8 g/dL (ref 3.5–5.0)
Anion gap: 15 (ref 5–15)
BILIRUBIN TOTAL: 1.1 mg/dL (ref 0.3–1.2)
BUN: 91 mg/dL — ABNORMAL HIGH (ref 6–20)
CALCIUM: 8.2 mg/dL — AB (ref 8.9–10.3)
CO2: 22 mmol/L (ref 22–32)
CREATININE: 5.8 mg/dL — AB (ref 0.61–1.24)
Chloride: 96 mmol/L — ABNORMAL LOW (ref 101–111)
GFR, EST AFRICAN AMERICAN: 10 mL/min — AB (ref >60)
GFR, EST NON AFRICAN AMERICAN: 9 mL/min — AB (ref >60)
Glucose, Bld: 162 mg/dL — ABNORMAL HIGH (ref 65–99)
Potassium: 4.3 mmol/L (ref 3.5–5.1)
Sodium: 133 mmol/L — ABNORMAL LOW (ref 135–145)
TOTAL PROTEIN: 7.2 g/dL (ref 6.5–8.1)

## 2017-06-13 LAB — CBC
HCT: 21.7 % — ABNORMAL LOW (ref 39.0–52.0)
HEMATOCRIT: 21.6 % — AB (ref 39.0–52.0)
HEMOGLOBIN: 7.4 g/dL — AB (ref 13.0–17.0)
Hemoglobin: 7.5 g/dL — ABNORMAL LOW (ref 13.0–17.0)
MCH: 31.4 pg (ref 26.0–34.0)
MCH: 31 pg (ref 26.0–34.0)
MCHC: 34.6 g/dL (ref 30.0–36.0)
MCHC: 34.3 g/dL (ref 30.0–36.0)
MCV: 90.8 fL (ref 78.0–100.0)
MCV: 90.4 fL (ref 78.0–100.0)
PLATELETS: 106 10*3/uL — AB (ref 150–400)
Platelets: 113 10*3/uL — ABNORMAL LOW (ref 150–400)
RBC: 2.39 MIL/uL — AB (ref 4.22–5.81)
RBC: 2.39 MIL/uL — AB (ref 4.22–5.81)
RDW: 15.2 % (ref 11.5–15.5)
RDW: 15.1 % (ref 11.5–15.5)
WBC: 9.6 10*3/uL (ref 4.0–10.5)
WBC: 10.7 10*3/uL — AB (ref 4.0–10.5)

## 2017-06-13 LAB — MRSA PCR SCREENING: MRSA BY PCR: NEGATIVE

## 2017-06-13 LAB — POCT I-STAT 3, VENOUS BLOOD GAS (G3P V)
ACID-BASE DEFICIT: 3 mmol/L — AB (ref 0.0–2.0)
ACID-BASE DEFICIT: 3 mmol/L — AB (ref 0.0–2.0)
BICARBONATE: 22.7 mmol/L (ref 20.0–28.0)
BICARBONATE: 22.8 mmol/L (ref 20.0–28.0)
O2 SAT: 56 %
O2 SAT: 60 %
PCO2 VEN: 45.8 mmHg (ref 44.0–60.0)
PH VEN: 7.302 (ref 7.250–7.430)
PO2 VEN: 33 mmHg (ref 32.0–45.0)
PO2 VEN: 34 mmHg (ref 32.0–45.0)
TCO2: 24 mmol/L (ref 0–100)
TCO2: 24 mmol/L (ref 0–100)
pCO2, Ven: 46.2 mmHg (ref 44.0–60.0)
pH, Ven: 7.305 (ref 7.250–7.430)

## 2017-06-13 LAB — BASIC METABOLIC PANEL
ANION GAP: 14 (ref 5–15)
BUN: 93 mg/dL — ABNORMAL HIGH (ref 6–20)
CO2: 23 mmol/L (ref 22–32)
Calcium: 8.2 mg/dL — ABNORMAL LOW (ref 8.9–10.3)
Chloride: 94 mmol/L — ABNORMAL LOW (ref 101–111)
Creatinine, Ser: 5.73 mg/dL — ABNORMAL HIGH (ref 0.61–1.24)
GFR, EST AFRICAN AMERICAN: 10 mL/min — AB (ref >60)
GFR, EST NON AFRICAN AMERICAN: 9 mL/min — AB (ref >60)
GLUCOSE: 132 mg/dL — AB (ref 65–99)
POTASSIUM: 4 mmol/L (ref 3.5–5.1)
SODIUM: 131 mmol/L — AB (ref 135–145)

## 2017-06-13 LAB — LACTATE DEHYDROGENASE: LDH: 317 U/L — AB (ref 98–192)

## 2017-06-13 LAB — PROTIME-INR
INR: 2.15
PROTHROMBIN TIME: 24.4 s — AB (ref 11.4–15.2)

## 2017-06-13 SURGERY — RIGHT HEART CATH
Anesthesia: LOCAL

## 2017-06-13 MED ORDER — LIDOCAINE HCL (PF) 1 % IJ SOLN
INTRAMUSCULAR | Status: DC | PRN
  Administered 2017-06-13: 30 mL

## 2017-06-13 MED ORDER — FUROSEMIDE 10 MG/ML IJ SOLN
160.0000 mg | Freq: Two times a day (BID) | INTRAVENOUS | Status: DC
  Administered 2017-06-13 – 2017-06-15 (×5): 160 mg via INTRAVENOUS
  Filled 2017-06-13 (×6): qty 16

## 2017-06-13 MED ORDER — LIDOCAINE HCL 1 % IJ SOLN
INTRAMUSCULAR | Status: AC
  Filled 2017-06-13: qty 20

## 2017-06-13 MED ORDER — POTASSIUM CHLORIDE CRYS ER 20 MEQ PO TBCR
20.0000 meq | EXTENDED_RELEASE_TABLET | Freq: Two times a day (BID) | ORAL | Status: DC
  Filled 2017-06-13: qty 1

## 2017-06-13 MED ORDER — BUDESONIDE 0.25 MG/2ML IN SUSP
0.2500 mg | Freq: Two times a day (BID) | RESPIRATORY_TRACT | Status: DC
  Administered 2017-06-13 – 2017-07-06 (×45): 0.25 mg via RESPIRATORY_TRACT
  Filled 2017-06-13 (×49): qty 2

## 2017-06-13 MED ORDER — HEPARIN (PORCINE) IN NACL 2-0.9 UNIT/ML-% IJ SOLN
INTRAMUSCULAR | Status: AC
  Filled 2017-06-13: qty 500

## 2017-06-13 MED ORDER — SODIUM CHLORIDE 0.9 % IV SOLN: INTRAVENOUS | Status: DC

## 2017-06-13 MED ORDER — ONDANSETRON HCL 4 MG/2ML IJ SOLN: 4.0000 mg | Freq: Four times a day (QID) | INTRAMUSCULAR | Status: DC | PRN

## 2017-06-13 MED ORDER — SODIUM CHLORIDE 0.9% FLUSH
3.0000 mL | INTRAVENOUS | Status: DC | PRN
  Administered 2017-06-19 – 2017-06-20 (×2): 3 mL via INTRAVENOUS
  Filled 2017-06-13 (×2): qty 3

## 2017-06-13 MED ORDER — BUSPIRONE HCL 5 MG PO TABS
5.0000 mg | ORAL_TABLET | Freq: Two times a day (BID) | ORAL | Status: DC
  Administered 2017-06-13 – 2017-07-07 (×45): 5 mg via ORAL
  Filled 2017-06-13 (×47): qty 1

## 2017-06-13 MED ORDER — TRAMADOL HCL 50 MG PO TABS
50.0000 mg | ORAL_TABLET | Freq: Four times a day (QID) | ORAL | Status: DC | PRN
  Administered 2017-06-13: 50 mg via ORAL
  Filled 2017-06-13: qty 1

## 2017-06-13 MED ORDER — MILRINONE LACTATE IN DEXTROSE 20-5 MG/100ML-% IV SOLN
INTRAVENOUS | Status: AC | PRN
  Administered 2017-06-13: 0.125 ug/kg/min via INTRAVENOUS

## 2017-06-13 MED ORDER — DARBEPOETIN ALFA 150 MCG/0.3ML IJ SOSY
150.0000 ug | PREFILLED_SYRINGE | INTRAMUSCULAR | Status: DC
  Administered 2017-06-13 – 2017-07-04 (×3): 150 ug via SUBCUTANEOUS
  Filled 2017-06-13 (×4): qty 0.3

## 2017-06-13 MED ORDER — SODIUM CHLORIDE 0.9 % IV SOLN: 250.0000 mL | INTRAVENOUS | Status: DC | PRN

## 2017-06-13 MED ORDER — ACETAMINOPHEN 325 MG PO TABS
650.0000 mg | ORAL_TABLET | ORAL | Status: DC | PRN
  Administered 2017-06-17 – 2017-06-19 (×2): 650 mg via ORAL
  Filled 2017-06-13 (×2): qty 2

## 2017-06-13 MED ORDER — LEVOTHYROXINE SODIUM 25 MCG PO TABS
25.0000 ug | ORAL_TABLET | Freq: Every day | ORAL | Status: DC
  Administered 2017-06-14 – 2017-07-06 (×22): 25 ug via ORAL
  Filled 2017-06-13 (×22): qty 1

## 2017-06-13 MED ORDER — ASPIRIN 81 MG PO CHEW: 81.0000 mg | CHEWABLE_TABLET | ORAL | Status: DC

## 2017-06-13 MED ORDER — SODIUM CHLORIDE 0.9% FLUSH: 3.0000 mL | Freq: Two times a day (BID) | INTRAVENOUS | Status: DC

## 2017-06-13 MED ORDER — SODIUM CHLORIDE 0.9% FLUSH: 3.0000 mL | INTRAVENOUS | Status: DC | PRN

## 2017-06-13 MED ORDER — GABAPENTIN 600 MG PO TABS
300.0000 mg | ORAL_TABLET | Freq: Two times a day (BID) | ORAL | Status: DC
  Administered 2017-06-13 – 2017-06-16 (×7): 300 mg via ORAL
  Filled 2017-06-13 (×7): qty 1

## 2017-06-13 MED ORDER — PANTOPRAZOLE SODIUM 40 MG PO TBEC
40.0000 mg | DELAYED_RELEASE_TABLET | Freq: Every day | ORAL | Status: DC
  Administered 2017-06-14 – 2017-07-06 (×21): 40 mg via ORAL
  Filled 2017-06-13 (×22): qty 1

## 2017-06-13 MED ORDER — SODIUM CHLORIDE 0.9% FLUSH
3.0000 mL | Freq: Two times a day (BID) | INTRAVENOUS | Status: DC
  Administered 2017-06-14 – 2017-06-21 (×9): 3 mL via INTRAVENOUS

## 2017-06-13 MED ORDER — MILRINONE LACTATE IN DEXTROSE 20-5 MG/100ML-% IV SOLN
0.1250 ug/kg/min | INTRAVENOUS | Status: DC
  Administered 2017-06-13: 0.125 ug/kg/min via INTRAVENOUS
  Administered 2017-06-15 – 2017-06-17 (×4): 0.25 ug/kg/min via INTRAVENOUS
  Filled 2017-06-13 (×6): qty 100

## 2017-06-13 MED ORDER — SILDENAFIL CITRATE 20 MG PO TABS
40.0000 mg | ORAL_TABLET | Freq: Three times a day (TID) | ORAL | Status: DC
  Administered 2017-06-13 – 2017-07-07 (×68): 40 mg via ORAL
  Filled 2017-06-13 (×68): qty 2

## 2017-06-13 MED ORDER — TRAZODONE HCL 50 MG PO TABS
50.0000 mg | ORAL_TABLET | Freq: Every evening | ORAL | Status: DC | PRN
  Administered 2017-06-13: 50 mg via ORAL
  Filled 2017-06-13: qty 1

## 2017-06-13 MED ORDER — AMLODIPINE BESYLATE 5 MG PO TABS
5.0000 mg | ORAL_TABLET | Freq: Every day | ORAL | Status: DC
  Administered 2017-06-14: 5 mg via ORAL
  Filled 2017-06-13: qty 1

## 2017-06-13 MED ORDER — DEXTROSE 5 % IV SOLN
160.0000 mg | Freq: Once | INTRAVENOUS | Status: AC
  Administered 2017-06-13: 160 mg via INTRAVENOUS
  Filled 2017-06-13: qty 16

## 2017-06-13 MED ORDER — ALBUTEROL SULFATE (2.5 MG/3ML) 0.083% IN NEBU
3.0000 mL | INHALATION_SOLUTION | RESPIRATORY_TRACT | Status: DC | PRN
  Administered 2017-06-16 – 2017-06-19 (×2): 3 mL via RESPIRATORY_TRACT
  Filled 2017-06-13 (×2): qty 3

## 2017-06-13 MED ORDER — ACETAMINOPHEN 325 MG PO TABS: 650.0000 mg | ORAL_TABLET | ORAL | Status: DC | PRN

## 2017-06-13 MED ORDER — ATORVASTATIN CALCIUM 40 MG PO TABS
40.0000 mg | ORAL_TABLET | Freq: Every day | ORAL | Status: DC
  Administered 2017-06-14 – 2017-07-07 (×23): 40 mg via ORAL
  Filled 2017-06-13 (×23): qty 1

## 2017-06-13 MED ORDER — ALBUTEROL SULFATE (2.5 MG/3ML) 0.083% IN NEBU: 2.5000 mg | INHALATION_SOLUTION | RESPIRATORY_TRACT | Status: DC

## 2017-06-13 MED ORDER — VITAMIN C 500 MG PO TABS
1000.0000 mg | ORAL_TABLET | Freq: Every day | ORAL | Status: DC
  Administered 2017-06-15 – 2017-06-23 (×9): 1000 mg via ORAL
  Filled 2017-06-13 (×9): qty 2

## 2017-06-13 MED ORDER — HEPARIN (PORCINE) IN NACL 2-0.9 UNIT/ML-% IJ SOLN
INTRAMUSCULAR | Status: AC | PRN
  Administered 2017-06-13: 500 mL

## 2017-06-13 MED ORDER — DOCUSATE SODIUM 100 MG PO CAPS
100.0000 mg | ORAL_CAPSULE | Freq: Two times a day (BID) | ORAL | Status: DC
  Administered 2017-06-14 – 2017-07-07 (×33): 100 mg via ORAL
  Filled 2017-06-13 (×37): qty 1

## 2017-06-13 MED ORDER — ADULT MULTIVITAMIN W/MINERALS CH
1.0000 | ORAL_TABLET | Freq: Every day | ORAL | Status: DC
  Administered 2017-06-14 – 2017-06-22 (×9): 1 via ORAL
  Filled 2017-06-13 (×9): qty 1

## 2017-06-13 SURGICAL SUPPLY — 5 items
CATH SWAN GANZ 7F STRAIGHT (CATHETERS) ×2 IMPLANT
PACK CARDIAC CATHETERIZATION (CUSTOM PROCEDURE TRAY) ×2 IMPLANT
SHEATH PINNACLE 7F 10CM (SHEATH) ×2 IMPLANT
TRANSDUCER W/STOPCOCK (MISCELLANEOUS) ×2 IMPLANT
WIRE EMERALD 3MM-J .025X260CM (WIRE) ×2 IMPLANT

## 2017-06-13 NOTE — Progress Notes (Signed)
Pt's wallet at bedside. Pt advised of valuables policy and advised pt to place wallet with security. Pt declined at this time.

## 2017-06-13 NOTE — Interval H&P Note (Signed)
History and Physical Interval Note:  06/13/2017 1:01 PM  MELCHOR KIRCHGESSNER  has presented today for surgery, with the diagnosis of hf  The various methods of treatment have been discussed with the patient and family. After consideration of risks, benefits and other options for treatment, the patient has consented to  Procedure(s): RIGHT HEART CATH (N/A) as a surgical intervention .  [name] history has been reviewed, patient examined, no change in status, stable for surgery.  I have reviewed the patient's chart and labs.  Questions were answered to the patient's satisfaction.     Bryanna Yim, Quillian Quince

## 2017-06-13 NOTE — Progress Notes (Addendum)
Incomplete Session Note  Patient Details  Name: Trevor [name] MRN: [id number] Date of Birth: Oct 07, 1946 Referring Provider:     CARDIAC REHAB PHASE II ORIENTATION from 04/03/2017 in [place]  Referring Provider  Loralie Champagne MD      Trevor [name] did not complete his rehab session. Trevor [name] was noted to struggle walking in from the cardiac rehab parking lot.  Blood pressure 126/82 with a map of 97. Oxygen saturation 96% on room air. Trevor [name] was noted to cough. Positive bilateral lower extremity edema and abdominal extension noted.  Weight 89.8 kg Trevor [name] LVAD coordinator paged and notified. Trevor [name] advised that Trevor [name] be brought to the heart failure clinic for further evaluation. Patient transported to the heart failure clinic via wheelchair. Trevor [name] did not exercise today.Barnet Pall, RN,BSN 06/13/2017 2:07 PM

## 2017-06-13 NOTE — H&P (View-Only) (Signed)
Patient presents for sick visit in [facility] today. Reports no problems with VAD equipment or concerns with drive line. Cardiac rehab called today to say that pt is 10lbs up and increasingly short of breath. Demadex was decreased in clinic last week.   Vital Signs:  Doppler Pressure 88  Automatc BP: 113/79 (91) HR:68 SPO2:96  %  Weight: 197 lb w/o eqt Last weight: 185 lb Home weights: 180 lbs   VAD Indication: Destination therapy - age excluding  VAD interrogation & Equipment Management (reviewed with Dr.McLean): Speed:9400 Flow: 5.8 Power: 6.1 w    PI: 5.4  Alarms: no clinical alarms Events: rare  Fixed speed 9400 Low speed limit: 8800  Primary Controller:  Replace back up battery in 51months. Back up controller:   Replace back up battery in 16 months.  Annual Equipment Maintenance on UBC/PM was performed on 12/2016.   I reviewed the LVAD parameters from today and compared the results to the patient's prior recorded data. LVAD interrogation was NEGATIVE for significant power changes, NEGATIVE for clinical alarms and STABLE for PI events/speed drops. No programming changes were made and pump is functioning within specified parameters. Pt is performing daily controller and system monitor self tests along with completing weekly and monthly maintenance for LVAD equipment.  LVAD equipment check completed and is in good working order. Back-up equipment present. Charged back up battery and performed self-test on equipment.   Exit Site Care: Drive line is being once weekly by VAD Coordinators. Caregiver [name] -sister) is currently unavailable. Gentle window dressing not sticking, pt had taped down one edge; dressing removed and site care performed using sterile technique. Drive line exit site cleaned with Chlora prep applicators x 2, rinsed with saline, allowed to dry, and biopatch disc applied, topped with antimicrobial sponge and gentle window dressing. Skin protectant  applied before placement. Dressing then covered with 2 large Tegaderm dressing. Exit site healed and incorporated, the velour is fully implanted at exit site. Redness noted around exit site, chronic in nature. Small amount brown drainage noted with no tenderness or foul odor noted. Psoriasis rash noted around dressing site and under foley anchor attachment device. Centurion foley anchor attachment device re-applied. Pt denies fever or chills.    Significant Events on VAD Support:  02/2017> GIB- AVM clipped x2  Device:Medtronic single lead Therapies: on at 231 bpm Last check: 05/19/17   BP & Labs:  MAP 88 - Doppler is reflecting modified systolic  Hgb 7.9- No S/S of bleeding. Specifically denies melena/BRBPR or nosebleeds.  LDH stable at 314 with established baseline of 225- 300. Denies tea-colored urine. No power elevations noted on interrogation.   6 mo Intermacs follow up completed including:  Quality of Life, KCCQ-12, and Neurocognitive trail making.   Pt deferred 6 minute walk due to SOB and increase in weight due to volume overload.  Back up controller:  11V backup battery charged during this visit.  Plan: 1. Take an extra 40 mg of Demadex today. 2. Increase Demadex to 60 mg daily. 3. Take an extra Metolazone Tuesday and Wednesday. 4. Return to clinic in 1 week for dressing change and labs.   [name] VAD Coordinator   Office: [phone] 24/7 Emergency VAD Pager: [phone]     Trevor Wainright Murphyis [name] 71 y.o.Trevor [name] history of CAD s/p CABG x 4 2010, OSA, AS with TAVR 2015, CKD, DM2, paroxysmal atrial fibrillation, asthma, and ischemic cardiomyopathy with Medtronic ICD.   Trevor [name] to [facility] from [facility]  [place] in early 2018. Over the last couple of years, he had been admitted multiple times for CHF. Last admission in [place] was in 1/18. Sounds like the hospitalization was complicated by cardiorenal syndrome and there was consideration of doing  dialysis.  He was admitted in to [facility] 11/27/16 with NYHA class IV symptoms and hypotension. He was placed on dual inotropes to facilitate diuresis, cardiac output and renal function.  CT surgery consulted for mechanical support and he was deemed appropriate for LVAD work up. He completed LVAD work up and was approved for HMII LVAD --> DT. On 12/11/16, he had IABP placed to optimize cardiac output and improve renal function. He then underwent HMII LVAD placement + tricuspid valve repair on 12/14/2015. Post operatively pressors weaned off slowly. He required significant diuresis.  Course was complicated by RV dysfunction and renal dysfunction.  Patient was admitted with lower GI bleeding in 4/18.  He had been on ASA 325 (increased with mild LDH elevation) and warfarin INR 2-2.5.  He had 4 units PRBCs total.  Colonoscopy showed colonic AVM treated with APC and clipping.  He was sent home off ASA and on warfarin INR goal 2-2.5.   In 5/18, creatinine was noted to increase as high as 3.67.  I cut back on his diuretics but he became volume overloaded.  I increased diuretics again with poor response.  In 5/18, he had RHC showing elevated right and left heart filling pressures and preserved cardiac output. I admitted him for IV diuresis.  Weight and creatinine came down, creatinine was 2.9 when discharged.  Ramp echo was done and speed was increased to 9200 rpm.  Sildenafil was increased to 40 mg tid.  At last visit, speed was increased to 9400 rpm and torsemide was decreased to 40 mg daily, later increased back to 60 mg daily with increased volume.   Recently, creatinine increased to 4 and torsemide was again cut back to 40 mg daily.  Once again, he has developed volume overload.  Weight is up 12 lbs and he is short of breath with moderate exertion.  He has developed peripheral edema.  No BRBPR/melena.   He has been noted to have [name] skin cancer on his neck, will need this removed.  He should be able to continue his  coumadin.   Labs (3/18): LDH 331 => 401 => 443 => 366 => 313, K 3.6 => 3.2 => 3.5, creatinine 1.98 => 1.74 => 1.8 => 1.79, hgb 8.3 => 8.9 => 8.7 => 8.4, INR 2.11 => 2.4 Labs (4/18): hgb 9.4 Labs (5/18): LDH 269 => 249, INR 2.57, K 4.5, creatinine 1.92 => 2.71 => 3.67 => 3.43 => 3.1 => 2.93, hgb 9, plts 104 Labs (6/18): K 4.5, creatinine 3.6 => 3.43, hgb 8.5, INR 2.4, LDH 287\ Labs (7/18): K 4.5, creatinine 3.52 => 4, hgb 7.9 => 8.5 Labs (8/18): K 3.8, creatinine 4, hgb 8.5  LVAD parameters: See nurse's note above.  I reviewed.   PMH: 1. CAD: s/p CABG in 2010.  2. Aortic stenosis: s/p TAVR in 2015. Valve looked ok on 2/18 echo.  3. Atrial fibrillation: Chronic.  4. Type II diabetes.  5. CKD: Stage IV, cardiorenal syndrome.  6. Chronic systolic CHF: Ischemic cardiomyopathy with prominent RV dysfunction.  Medtronic ICD.  - Echo (2/18): EF 20-25%, moderately dilated LV, moderately dilated/moderately dysfunctional RV, severe TR.  - Cardiogenic shock 2/18 requiring milrinone, norepinephrine, and IABP.  - Heartmate II LVAD for DT placed 12/13/16.   -  RHC (5/18): mean RA 16, PA 55/22 mean 33, mean PCWP 23, CI 2.59, PVR 3.87 7. OSA.  8. Thrombocytopenia: Post-op LVAD, resolved.  9. Tricuspid regurgitation: Severe, s/p repair with LVAD placement in 2/18.  10. Lower GI bleed (4/18): Colonic AVM, treated with APC and clipping.  11. Psoriasis  Social History   Social History  . Marital status: Widowed    Spouse name: N/[name]  . Number of children: 0  . Years of education: 14   Occupational History  . Not on file.   Social History Main Topics  . Smoking status: Never Smoker  . Smokeless tobacco: Never Used  . Alcohol use No  . Drug use: No  . Sexual activity: Not Currently   Other Topics Concern  . Not on file   Social History Narrative   Patient is [name] widow. Moved to [place] from [place], [place]. Currently lives with his sister Bobbie Valletta.    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    Family History  Problem Relation Age of Onset  . Heart failure Father   . Heart attack Father   . Healthy Mother   . Diabetes Paternal Grandfather    ROS: All systems reviewed and negative except as per HPI.   Current Outpatient Prescriptions  Medication Sig Dispense Refill  . albuterol (PROVENTIL HFA;VENTOLIN HFA) 108 (90 Base) MCG/ACT inhaler Inhale 2 puffs into the lungs every 4 (four) hours as needed for wheezing or shortness of breath. 1 Inhaler 5  . amLODipine (NORVASC) 5 MG tablet Take 1 tablet (5 mg total) by mouth daily. 30 tablet 6  . ascorbic acid (VITAMIN C) 1000 MG tablet Take 1,000 mg by mouth daily at 12 noon.     Marland Kitchen atorvastatin (LIPITOR) 40 MG tablet Take 1 tablet (40 mg total) by mouth daily. 30 tablet 6  . busPIRone (BUSPAR) 5 MG tablet Take 1 tablet (5 mg total) by mouth 2 (two) times daily. 60 tablet 6  . docusate sodium (COLACE) 100 MG capsule Take 1 capsule (100 mg total) by mouth 2 (two) times daily. 60 capsule 12  . fluticasone (FLOVENT HFA) 110 MCG/ACT inhaler Inhale 2 puffs into the lungs 2 (two) times daily. 1 Inhaler 5  . gabapentin (NEURONTIN) 600 MG tablet Take 0.5 tablets (300 mg total) by mouth 2 (two) times daily. 60 tablet 6  . insulin glargine (LANTUS) 100 unit/mL SOPN Inject 0.2 mLs (20 Units total) into the skin at bedtime. 15 mL 0  . levothyroxine (SYNTHROID, LEVOTHROID) 25 MCG tablet Take 1 tablet (25 mcg total) by mouth daily before breakfast. 30 tablet 6  . metolazone (ZAROXOLYN) 2.5 MG tablet Take 1 tablet (2.5 mg total) by mouth once [name] week. 10 tablet 3  . Multiple Vitamin (MULTIVITAMIN WITH MINERALS) TABS tablet Take 1 tablet by mouth daily at 12 noon.    . pantoprazole (PROTONIX) 40 MG tablet Take 1 tablet (40 mg total) by mouth daily at 12 noon. 30 tablet 6  . potassium chloride SA (K-DUR,KLOR-CON) 20 MEQ tablet TAKE 2 TABLETS BY MOUTH DAILY WITH AN ADDITIONAL 1 TABLET ONCE WEEKLY ON METOLAZONE DAYS 182 tablet 3  . sildenafil  (REVATIO) 20 MG tablet Take 2 tablets (40 mg total) by mouth 3 (three) times daily. 180 tablet 6  . torsemide (DEMADEX) 20 MG tablet Take 2 tablets (40 mg total) by mouth daily. 90 tablet 5  . traZODone (DESYREL) 50 MG tablet TAKE 1 TABLET(50 MG) BY MOUTH AT BEDTIME AS  NEEDED FOR SLEEP 30 tablet 0  . warfarin (COUMADIN) 5 MG tablet Take 5 mg daily except 7.5 mg (1 and 1/2 tablet) on Tuesday.    Marland Kitchen albuterol (PROVENTIL) (2.5 MG/3ML) 0.083% nebulizer solution Take 3 mLs (2.5 mg total) by nebulization every 4 (four) hours. And as needed (Patient not taking: Reported on 06/03/2017) 150 mL 5  . benzonatate (TESSALON) 100 MG capsule Take 1 capsule (100 mg total) by mouth 3 (three) times daily as needed for cough. (Patient not taking: Reported on 06/03/2017) 20 capsule 6  . traMADol (ULTRAM) 50 MG tablet TAKE 1 TABLET BY MOUTH EVERY 6 HOURS AS NEEDED FOR MODERATE PAIN 30 tablet 0   No current facility-administered medications for this encounter.    Facility-Administered Medications Ordered in Other Encounters  Medication Dose Route Frequency Provider Last Rate Last Dose  . 0.9 %  sodium chloride infusion   Intravenous Once Larey Dresser, MD       BP 113/79 Comment: 91 map  Pulse 64   Ht 5\' 5"  (1.651 m)   Wt 197 lb (89.4 kg)   SpO2 96%   BMI 32.78 kg/m   MAP 90 General: NAD Neck: JVP 14-16cm, no thyromegaly or thyroid nodule.  Lungs: CTAB  CV: LVAD hum. 2+ edema to knees. No carotid bruit.  Abdomen: Soft, nontender, no hepatosplenomegaly, no distention.  Skin: Psoriasis lesions.  Neurologic: Alert and oriented x 3.  Psych: Normal affect. Extremities: No clubbing or cyanosis.  HEENT: Normal.   Assessment/Plan: 1. Chronic systolic CHF: Ischemic cardiomyopathy, EF 20-25% with RV dysfunction on 2/18 echo pre-LVAD.  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  Medtronic ICD.  Overall, he has felt much better post-LVAD.  However, he developed AK and was eventually admitted in 5/18 after RHC showed elevated  filling pressures.  He was diuresed and creatinine came down. Since then, we have adjusted up and down his torsemide dose, with higher dose volume improves but creatinine worsens.  With lower dose we can make creatinine look better but he gets volume overloaded.  Currently, he is again volume overloaded with NYHA class III symptoms on lower dose torsemide.  I suspect significant RV dysfunction is making volume management difficult.  - Increase torsemide back to 60 mg daily.  Take metolazone on Tuesday and Wednesday of this week then go back to once weekly.  Keep sodium intake minimal.  We may have to accept [name] higher creatinine.  - Continue warfarin, no ASA with recent GI bleeding.  - Continue cardiac rehab.     - I will arrange for ramp echo to see if we can safely increase VAD speed.  - May need repeat RHC, ?milrinone.  2. CAD: s/p CABG.  No chest pain.  He is on atorvastatin.  3. CKD stage IV:  Creatinine continues to fluctuate up and down with diuretic dose.  As above, think RV failure is complicating. BMET today. 4. Atrial fibrillation: Chronic.  On warfarin.  5. RV failure: He is on Revatio, now increased to 40 mg tid.   6. HTN: MAP controlled.    7. Anemia: Baseline anemia of renal disease/chronic disease but recent bleeding from colonic AVMs.  Hopefully renal will start him on Aranesp at next visit.  - CBC today.  8. 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  - Continue octreotide injections.  9. Skin cancer: On neck.  Needs excision, does not have to stop warfarin.   Loralie Champagne 06/10/2017

## 2017-06-13 NOTE — Consult Note (Signed)
[facility] Consultation Note Requesting Physician:  Aundra Dubin, MD Primary Nephrologist: Hollie Salk Reason for Consult:  Acute on Chronic Renal Failure  HPI: This is a very kind 71 y.o. 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   Directly admitted 06/13/17 from HF clinic with complaints of SOB, weight gain and decreased UOP despite adherence to increased Torsemide and Metolazone. He has been seen several times in clinic recently for volume overload and torsemide has been increased. Unfortunately there has been difficulty controlling his volume status secondary to worsening renal function with dose increases. Creatinine nears baseline when decreased however quickly becomes volume overloaded. Cr today 5.8 and recent baseline seems to be ~3.5 however was ~1.5 at the beginning of the year. RHC performed day of admission with R>L heart failure with low cardiac output. LVAD speed was increased to 9600.   Patient endorses decreased appetite and fatigue for several days. Has only been bothered by SOB for the past 2 days. Denies nausea, vomiting. Does report decreased UOP.   Followed by Dr. Hollie Salk at [facility], last seen 8/8, notably more SOB, weight up, creatinine noted at 4.9. Inotropic support recommended, deferred to [name]. Pt not a candidate for HD d/t biventricular failure, LVAD + RHF makes HD unsafe, not a PD candidate d/t drive line for the LVAD, Pt aware that his renal replacement options limited.    Creatinine, Ser  Date/Time Value Ref Range Status  06/13/2017 09:31 AM 5.80 (H) 0.61 - 1.24 mg/dL Final  06/09/2017 11:58 AM 4.79 (H) 0.61 - 1.24 mg/dL Final  06/03/2017 09:54 AM 4.00 (H) 0.61 - 1.24 mg/dL Final  05/29/2017 08:55 AM 3.23 (H) 0.61 - 1.24 mg/dL Final  05/22/2017 09:00 AM 3.76 (H) 0.61 - 1.24  mg/dL Final  05/19/2017 11:22 AM 3.52 (H) 0.61 - 1.24 mg/dL Final  05/15/2017 08:58 AM 3.52 (H) 0.61 - 1.24 mg/dL Final  05/08/2017 01:00 PM 3.47 (H) 0.61 - 1.24 mg/dL Final  05/01/2017 11:19 AM 3.43 (H) 0.61 - 1.24 mg/dL Final  04/24/2017 09:58 AM 3.60 (H) 0.61 - 1.24 mg/dL Final  04/10/2017 10:29 AM 2.89 (H) 0.61 - 1.24 mg/dL Final  04/03/2017 10:40 AM 3.43 (H) 0.61 - 1.24 mg/dL Final  03/29/2017 02:25 AM 2.93 (H) 0.61 - 1.24 mg/dL Final  03/28/2017 02:41 AM 2.92 (H) 0.61 - 1.24 mg/dL Final  03/27/2017 12:14 PM 2.99 (H) 0.61 - 1.24 mg/dL Final  03/24/2017 09:30 AM 3.26 (H) 0.61 - 1.24 mg/dL Final  03/20/2017 09:15 AM 3.10 (H) 0.61 - 1.24 mg/dL Final  03/17/2017 09:05 AM 3.43 (H) 0.61 - 1.24 mg/dL Final  03/13/2017 09:18 AM 3.67 (H) 0.61 - 1.24 mg/dL Final  03/06/2017 10:05 AM 2.71 (H) 0.61 - 1.24 mg/dL Final  02/26/2017 02:13 PM 2.21 (H) 0.61 - 1.24 mg/dL Final  02/20/2017 02:39 AM 1.92 (H) 0.61 - 1.24 mg/dL Final  02/19/2017 02:44 AM 1.90 (H) 0.61 - 1.24 mg/dL Final  02/18/2017 12:09 AM 1.77 (H) 0.61 - 1.24 mg/dL Final  02/17/2017 02:53 AM 1.73 (H) 0.61 - 1.24 mg/dL Final  02/16/2017 03:13 AM 1.86 (H) 0.61 - 1.24 mg/dL Final  02/15/2017 02:06 AM 1.62 (H) 0.61 - 1.24 mg/dL Final  02/14/2017 03:35 AM 1.65 (H) 0.61 - 1.24 mg/dL Final  02/13/2017 12:16 AM 1.81 (H) 0.61 - 1.24 mg/dL Final  02/12/2017 02:38 AM  1.69 (H) 0.61 - 1.24 mg/dL Final  02/11/2017 07:11 AM 1.69 (H) 0.61 - 1.24 mg/dL Final  02/10/2017 02:16 PM 1.93 (H) 0.61 - 1.24 mg/dL Final  01/30/2017 09:23 AM 1.79 (H) 0.61 - 1.24 mg/dL Final  01/23/2017 09:58 AM 1.81 (H) 0.61 - 1.24 mg/dL Final  01/16/2017 09:59 AM 1.74 (H) 0.61 - 1.24 mg/dL Final  01/09/2017 11:24 AM 1.98 (H) 0.61 - 1.24 mg/dL Final  01/01/2017 03:04 AM 1.80 (H) 0.61 - 1.24 mg/dL Final  12/31/2016 04:56 AM 1.73 (H) 0.61 - 1.24 mg/dL Final  12/30/2016 04:49 AM 1.78 (H) 0.61 - 1.24 mg/dL Final  12/29/2016 04:48 AM 1.67 (H) 0.61 - 1.24 mg/dL Final  12/28/2016  04:31 AM 1.52 (H) 0.61 - 1.24 mg/dL Final  12/27/2016 08:51 AM 1.47 (H) 0.61 - 1.24 mg/dL Final  12/26/2016 08:51 AM 1.47 (H) 0.61 - 1.24 mg/dL Final  12/25/2016 04:42 AM 1.50 (H) 0.61 - 1.24 mg/dL Final  12/24/2016 04:08 AM 1.64 (H) 0.61 - 1.24 mg/dL Final  12/23/2016 05:05 AM 1.85 (H) 0.61 - 1.24 mg/dL Final  12/22/2016 04:01 AM 2.12 (H) 0.61 - 1.24 mg/dL Final  12/21/2016 02:58 AM 2.38 (H) 0.61 - 1.24 mg/dL Final  12/20/2016 04:11 AM 2.32 (H) 0.61 - 1.24 mg/dL Final  12/19/2016 02:58 PM 2.30 (H) 0.61 - 1.24 mg/dL Final  12/19/2016 04:00 AM 2.24 (H) 0.61 - 1.24 mg/dL Final  12/18/2016 04:57 PM 1.50 (H) 0.61 - 1.24 mg/dL Final    Past Medical History:  Diagnosis Date  . AICD (automatic cardioverter/defibrillator) present   . Asthma   . CHF (congestive heart failure) ([place])   . Diabetes ([place])   . Kidney disease   . Presence of permanent cardiac pacemaker    AICD  . Sleep apnea     Past Surgical History:  Procedure Laterality Date  . CARDIAC CATHETERIZATION N/A 12/02/2016   Procedure: Right Heart Cath;  Surgeon: Larey Dresser, MD;  Location: [place] [facility];  Service: Cardiovascular;  Laterality: N/A;  . COLONOSCOPY N/A 02/14/2017   Procedure: COLONOSCOPY;  Surgeon: Milus Banister, MD;  Location: [place];  Service: Endoscopy;  Laterality: N/A;  . IABP INSERTION N/A 12/11/2016   Procedure: IABP Insertion;  Surgeon: Larey Dresser, MD;  Location: [place] [facility];  Service: Cardiovascular;  Laterality: N/A;  . INSERTION OF IMPLANTABLE LEFT VENTRICULAR ASSIST DEVICE N/A 12/13/2016   Procedure: INSERTION OF IMPLANTABLE LEFT VENTRICULAR ASSIST DEVICE;  Surgeon: Ivin Poot, MD;  Location: [place];  Service: Open Heart Surgery;  Laterality: N/A;  HEARTMATE II  NITRIC OXIDE  . RIGHT HEART CATH N/A 12/11/2016   Procedure: Right Heart Cath;  Surgeon: Larey Dresser, MD;  Location: [place] [facility];  Service: Cardiovascular;  Laterality: N/A;  . RIGHT HEART CATH N/A 03/27/2017    Procedure: Right Heart Cath;  Surgeon: Larey Dresser, MD;  Location: [place] [facility];  Service: Cardiovascular;  Laterality: N/A;  . TEE WITHOUT CARDIOVERSION N/A 12/13/2016   Procedure: TRANSESOPHAGEAL ECHOCARDIOGRAM (TEE);  Surgeon: Ivin Poot, MD;  Location: [place];  Service: Open Heart Surgery;  Laterality: N/A;  . TRICUSPID VALVE REPLACEMENT N/A 12/13/2016   Procedure: TRICUSPID VALVE REPAIR WITH EDWARDS MC 3 TRICUSPID ANNULOPLASTY RING MODEL 4900 SIZE T 28;  Surgeon: Ivin Poot, MD;  Location: [place];  Service: Open Heart Surgery;  Laterality: N/A;   Family History  Problem Relation Age of Onset  . Heart failure Father   . Heart attack  Father   . Healthy Mother   . Diabetes Paternal Grandfather    Social History: He recently moved to [place] from [facility]. His wife battled breast and uterine cancer while he was undergoing health issues of his own. He has never smoked or used recreational drugs. Denies any alcohol use. Lives at home alone.   Allergies: No Known Allergies  Home medications: Prior to Admission medications   Medication Sig Start Date End Date Taking? Authorizing Provider  albuterol (PROVENTIL HFA;VENTOLIN HFA) 108 (90 Base) MCG/ACT inhaler Inhale 2 puffs into the lungs every 4 (four) hours as needed for wheezing or shortness of breath. 11/27/16   Byrum, Rose Fillers, MD  albuterol (PROVENTIL) (2.5 MG/3ML) 0.083% nebulizer solution Take 3 mLs (2.5 mg total) by nebulization every 4 (four) hours. And as needed 11/27/16   [name], MD  amLODipine (NORVASC) 5 MG tablet Take 1 tablet (5 mg total) by mouth daily. 02/21/17   Clegg, Amy D, NP  ascorbic acid (VITAMIN C) 1000 MG tablet Take 1,000 mg by mouth daily at 12 noon.     [provider]  atorvastatin (LIPITOR) 40 MG tablet Take 1 tablet (40 mg total) by mouth daily. 01/01/17   Clegg, Amy D, NP  benzonatate (TESSALON) 100 MG capsule Take 1 capsule (100 mg total) by mouth 3 (three) times daily as  needed for cough. Patient not taking: Reported on 06/13/2017 01/23/17   Larey Dresser, MD  busPIRone (BUSPAR) 5 MG tablet Take 1 tablet (5 mg total) by mouth 2 (two) times daily. 01/01/17   Clegg, Amy D, NP  docusate sodium (COLACE) 100 MG capsule Take 1 capsule (100 mg total) by mouth 2 (two) times daily. 04/03/17   Larey Dresser, MD  fluticasone (FLOVENT HFA) 110 MCG/ACT inhaler Inhale 2 puffs into the lungs 2 (two) times daily. 11/27/16   [name], MD  gabapentin (NEURONTIN) 600 MG tablet Take 0.5 tablets (300 mg total) by mouth 2 (two) times daily. 01/01/17   Clegg, Amy D, NP  insulin glargine (LANTUS) 100 unit/mL SOPN Inject 0.2 mLs (20 Units total) into the skin at bedtime. 01/10/17   Larey Dresser, MD  levothyroxine (SYNTHROID, LEVOTHROID) 25 MCG tablet Take 1 tablet (25 mcg total) by mouth daily before breakfast. 01/01/17   Clegg, Amy D, NP  metolazone (ZAROXOLYN) 2.5 MG tablet Take 1 tablet (2.5 mg total) by mouth once a week. 03/29/17 03/29/18  Rogelia Mire, NP  Multiple Vitamin (MULTIVITAMIN WITH MINERALS) TABS tablet Take 1 tablet by mouth daily at 12 noon.    [provider]  pantoprazole (PROTONIX) 40 MG tablet Take 1 tablet (40 mg total) by mouth daily at 12 noon. 01/01/17   Clegg, Amy D, NP  potassium chloride SA (K-DUR,KLOR-CON) 20 MEQ tablet TAKE 2 TABLETS BY MOUTH DAILY WITH AN ADDITIONAL 1 TABLET ONCE WEEKLY ON METOLAZONE DAYS 04/01/17   Bensimhon, Shaune Pascal, MD  sildenafil (REVATIO) 20 MG tablet Take 2 tablets (40 mg total) by mouth 3 (three) times daily. 03/29/17   Rogelia Mire, NP  torsemide (DEMADEX) 20 MG tablet Take 2 tablets (40 mg total) by mouth daily. Patient taking differently: Take 60 mg by mouth daily.  06/03/17 10/16/17  Larey Dresser, MD  traMADol (ULTRAM) 50 MG tablet TAKE 1 TABLET BY MOUTH EVERY 6 HOURS AS NEEDED FOR MODERATE PAIN 06/10/17   Shirley Friar, PA-C  traZODone (DESYREL) 50 MG tablet TAKE 1 TABLET(50 MG) BY MOUTH AT  BEDTIME AS NEEDED FOR SLEEP 05/12/17   Larey Dresser, MD  warfarin (COUMADIN) 5 MG tablet Take 5 mg daily except 7.5 mg (1 and 1/2 tablet) on Tuesday.    [provider]   Inpatient medications: . amLODipine  5 mg Oral Daily  . atorvastatin  40 mg Oral Daily  . budesonide  0.25 mg Nebulization BID  . busPIRone  5 mg Oral BID  . darbepoetin (ARANESP) injection - NON-DIALYSIS  150 mcg Subcutaneous Q Fri-1800  . docusate sodium  100 mg Oral BID  . gabapentin  300 mg Oral BID  . [START ON 06/14/2017] levothyroxine  25 mcg Oral QAC breakfast  . multivitamin with minerals  1 tablet Oral Q1200  . pantoprazole  40 mg Oral Q1200  . sildenafil  40 mg Oral TID  . sodium chloride flush  3 mL Intravenous Q12H  . ascorbic acid  1,000 mg Oral Q1200   . sodium chloride    . furosemide Stopped (06/13/17 1547)  . furosemide    . milrinone 0.125 mcg/kg/min (06/13/17 1433)   Review of Systems: Negative except as listed in HPI  Physical Exam:   Blood pressure 94/67, pulse 75, resp. rate 14, weight 197 lb 8.5 oz (89.6 kg), SpO2 98 %. Gen: Very pleasant but chronically ill appearing caucasian male resting comfortably in bed. Upbeat. Lines/tubes: PIV x2. +JVD to mandible. Chest: Mechanical heart sounds. LVAD hum appreciated. Pulm: Diminished breath sounds BL. No wheezes or rales were appreciated. Speaks in complete sentences.  Abdomen: distended and tight abdomen. Non-tender. +bs. LVAD lines with clean dry dressings. Small reducible umbilical  Hernia.  Ext: Warm and perfused. No cyanosis but chronic stasis dermatitis changes appreciated BL LE. +LE edema.  Neuro: alert, Ox3. Responds to questions appropriately.   RHC 06/13/17 Findings:  RA = 17 RV = 58/18 PA = 56/25 (37) PCW = 20 (v = 35) Fick cardiac output/index = 6.4/3.2 Thermo CO/CI = 4.1/2.1 PVR = 2.6 Ao sat = 98% PA sat = 56%, 60% Increased LVAD speed to 9600   PCW down to 19   Recent Labs Lab 06/09/17 1158 06/13/17 0931   NA 132* 133*  K 4.1 4.3  CL 95* 96*  CO2 23 22  GLUCOSE 199* 162*  BUN 81* 91*  CREATININE 4.79* 5.80*  CALCIUM 8.6* 8.2*     Recent Labs Lab 06/09/17 1158 06/13/17 0931  AST 28 28  ALT 23 23  ALKPHOS 99 102  BILITOT 1.0 1.1  PROT 7.8 7.2  ALBUMIN 4.0 3.8     Recent Labs Lab 06/09/17 1146 06/13/17 0931  WBC 10.3 9.6  HGB 7.9* 7.5*  HCT 23.3* 21.7*  MCV 91.0 90.8  PLT 112* 106*   Iron Studies: 05/2017 Iron/TIBC/Ferritin/ %Sat    Component Value Date/Time   IRON 52 05/29/2017 0855   TIBC 244 (L) 05/29/2017 0855   FERRITIN 490 (H) 05/29/2017 0855   IRONPCTSAT 21 05/29/2017 0855    Background:  71 y/o M with extensive cardiac history including ischemic cardiomyopathy s/p LVAD + ICD placement 12/2016 and CKD stage 4 here with volume overload and worsening renal function (BL mid 3's ->5.8). Difficulty in the past several weeks finding balance between acceptable diuresis and renal function. RHC today with R>L HF and decreased CO 2/2 cardiorenal syndrome. LVAD speed increased to 9600. Milrinone +/- dopamine & aggressive diuresis initiated.   Assessment/Recommendations  1. AKI on CKD4 2/2 Cardiorenal Syndrome: CW 197 lbs, DW ~180 lbs. Volume overloaded and Cr  markedly elevated at 5.8. BUN 91, much higher than recent BL mid 3's. Lytes stable. Follows with Dr. Hollie Salk and have reviewed most recent office records. Has never required HD before but was told that he would not be a good candidate. He has been made aware that because of his biventricular heart failure that he is not a dialysis candidate. PD is not technically feasible d/t LVAD drive line. Unfortunately there are no RRT that are appropriate long term. 1. Follow response to lasix and milrinone. Has not had inotropic support since prior to LVAD. Hopeful for response  2. Acute on Chronic Systolic HF, Ischemic Cardiomyopathy s/p LVAD (most recent echo 03/2017  20-25%, 5-10% prior to placement of LVAD) + ICD 12/2016.  Improved since placement however continues to struggle with volume status as diuresis has been limited by his renal function. Prominent RV failure on RHC today. 1. 2-view CXR pending 2. Daily weights, Strict I&O 3. Fluid restrict 4. Lasix per HF  5. Milrinone per HF 6. Sildenafil 40 mg TID for RV failure 3. Chronic atrial fibrillation: on warfarin. INR 2.15 today. Per pharm.  4. Hypothyroidism: On Synthroid 25 mcg/day 5. HLD: Atorvastatin 40mg  6. HTN: Amlodipine 5mg  7. Type 2 Diabetes with Diabetic Neuropathy: On Gabapentin 300mg  BID 8. COPD: Pulmicort nebs BID 9. History of GI bleed: Recent GI bleed from colonic AVMs which were cauterized+ clipped. Denied hematochezia or melena.  10. Anemia: Hb 7.5 on admission, baseline variable but ~8-9.  recent iron studies with Ferritin 490, Fe 52, TIBC 244 and sat ratio of 21. Not on iron suppl as outpatient 1. Aranesp 150mg  Q4weeks planned per Dr. Hollie Salk but pt hospitalized in interim. 2. Will dose Aranesp weekly here 3. Transfuse if indicated although caution for excess volume  Einar Gip, DO IM Resident - PGY2 [facility] [phone] pager 06/13/2017, 12:26 PM   Agree with note/plans in the above note. Very nice gentleman with PMH CAD/CABG, AS s/p TAVR, DM2, PAF, ICM w/ICD, LVAD, severe R HF. Followed in our office by Dr. Hollie Salk for CKD 2/2 cardiorenal syndrome, recent probs balancing diuretics w/renal function. Admitted following R heart cath showing R>L HF for IV diuretics, milrinone. Admission creatinine 5.8, highest it has ever been, w/baseline prior to difficulties of the past few weeks mid 3's. No appropriate long term RRT options. Pt aware of this, Dr. Hollie Salk has reviewed with him previously. Hoping for some improvement w/inotropes, high dose diuretics, ^ pump speed on LVAD.   Jamal Maes, MD [facility] [phone] Pager 06/13/2017, 6:22 PM

## 2017-06-13 NOTE — Progress Notes (Signed)
Patient arrived to clinic today from cardiac rehab with complaints of shortness of breath, weight gain, and decreased urination despite medication adherence. Plans made for patient to be direct admitted to the unit and will perform right heart cath this afternoon per Dr Haroldine Laws.  Balinda Quails RN, VAD Coordinator 24/7 pager [phone]

## 2017-06-13 NOTE — Progress Notes (Signed)
ANTICOAGULATION CONSULT NOTE  Pharmacy Consult for warfarin Indication: LVAD  No Known Allergies  Patient Measurements: Weight: 197 lb 8.5 oz (89.6 kg)   Vital Signs: BP: 94/67 (08/10 1200)  Labs:  Recent Labs  06/11/17 06/13/17 0931  HGB  --  7.5*  HCT  --  21.7*  PLT  --  106*  LABPROT  --  24.4*  INR 1.8 2.15  CREATININE  --  5.80*    Estimated Creatinine Clearance: 12 mL/min (A) (by C-G formula based on SCr of 5.8 mg/dL (H)).   Medical History: Past Medical History:  Diagnosis Date  . AICD (automatic cardioverter/defibrillator) present   . Asthma   . CHF (congestive heart failure) ([place])   . Diabetes ([place])   . Kidney disease   . Presence of permanent cardiac pacemaker    AICD  . Sleep apnea    Assessment: 71 y.o.malewith a history of CAD s/p CABG x 4 2010, OSA, AS with TAVR 2015, CKD, DM2, paroxysmal atrial fibrillation, asthma, and ischemic cardiomyopathy with Medtronic ICD.  Admitted for RHC to assess possible RV failure and volume overload.  Orders to continue hold warfarin post-cath and start heparin when INR <2. Plan to also initiate milrinone and high dose lasix. INR 2.1 this am, LDH continues to slowly trend up to 317 this am.   Anti-coag outpatient visit on 8/8 shows INR of 1.8, dose unchanged at that time, continue 5mg  daily except 7.5mg  on Tuesdays. Recent GIB so he is not on asa.  Goal of Therapy:  INR goal 2-2.5 Monitor platelets by anticoagulation protocol: Yes   Plan:  Hold warfarin for now Start heparin when INR < 2 Daily INR  Erin Hearing PharmD., BCPS Clinical Pharmacist Pager [phone] 06/13/2017 1:55 PM

## 2017-06-13 NOTE — H&P (Addendum)
Advanced Heart Failure VAD History and Physical Note   Reason for Admission: RV failure, marked volume overload  HPI:    Trevor [name] a 71 y.o.malewith a history of CAD s/p CABG x 4 2010, OSA, AS with TAVR 2015, CKD, DM2, paroxysmal atrial fibrillation, asthma, and ischemic cardiomyopathy with Medtronic ICD.   In 5/18, creatinine was noted to increase as high as 3.67. Previously cut back on his diuretics but he became volume overloaded.  I increased diuretics again with poor response.  In 5/18, he had RHC showing elevated right and left heart filling pressures and preserved cardiac output. He was admitted for IV diuresis.  Weight and creatinine came down, creatinine was 2.9 when discharged.  Ramp echo was done and speed was increased to 9200 rpm.  Sildenafil was increased to 40 mg tid.  At last visit, speed was increased to 9400 rpm and torsemide was decreased to 40 mg daily, later increased back to 60 mg daily with increased volume.   Recently, creatinine increased to 4 and torsemide was again cut back to 40 mg daily.    Pt presented earlier in the week with volume overload. Torsemide increased with weight gain of 12 lbs. He complained of Worsening SOB and peripheral edema.    He presented to clinic today as an add on and feels even worse, with no improvement after metolazone. He is now SOB with minimal exertion and occasionally at rest. Abdomen distended and appetite decreased. Mild lightheadedness getting from his car to the clinic. He denies BRBPR/melena.   LVAD INTERROGATION:  HeartMate II LVAD:  Flow 6.6 liters/min, speed 9400, power 6.6, PI 3.7.    Review of Systems: [y] = yes, [ ]  = no   General: Weight gain [y]; Weight loss [ ] ; Anorexia [ ] ; Fatigue [y]; Fever [ ] ; Chills [ ] ; Weakness [ ]   Cardiac: Chest pain/pressure [ ] ; Resting SOB [y]; Exertional SOB [y]; Orthopnea [y]; Pedal Edema [y]; Palpitations [ ] ; Syncope [ ] ; Presyncope [ ] ; Paroxysmal nocturnal  dyspnea[ ]   Pulmonary: Cough [ ] ; Wheezing[ ] ; Hemoptysis[ ] ; Sputum [ ] ; Snoring [ ]   GI: Vomiting[ ] ; Dysphagia[ ] ; Melena[ ] ; Hematochezia [ ] ; Heartburn[ ] ; Abdominal pain [ ] ; Constipation [ ] ; Diarrhea [ ] ; BRBPR [ ]   GU: Hematuria[ ] ; Dysuria [ ] ; Nocturia[ ]   Vascular: Pain in legs with walking [ ] ; Pain in feet with lying flat [ ] ; Non-healing sores [ ] ; Stroke [ ] ; TIA [ ] ; Slurred speech [ ] ;  Neuro: Headaches[ ] ; Vertigo[ ] ; Seizures[ ] ; Paresthesias[ ] ;Blurred vision [ ] ; Diplopia [ ] ; Vision changes [ ]   Ortho/Skin: Arthritis [y]; Joint pain [y]; Muscle pain [ ] ; Joint swelling [ ] ; Back Pain [ ] ; Rash [ ]   Psych: Depression[ ] ; Anxiety[ ]   Heme: Bleeding problems [ ] ; Clotting disorders [ ] ; Anemia [ ]   Endocrine: Diabetes [ ] ; Thyroid dysfunction[ ]     Home Medications Prior to Admission medications   Medication Sig Start Date End Date Taking? Authorizing Provider  albuterol (PROVENTIL HFA;VENTOLIN HFA) 108 (90 Base) MCG/ACT inhaler Inhale 2 puffs into the lungs every 4 (four) hours as needed for wheezing or shortness of breath. 11/27/16   Byrum, Rose Fillers, MD  albuterol (PROVENTIL) (2.5 MG/3ML) 0.083% nebulizer solution Take 3 mLs (2.5 mg total) by nebulization every 4 (four) hours. And as needed 11/27/16   [name], MD  amLODipine (NORVASC) 5 MG tablet Take 1 tablet (5 mg total) by mouth daily.  02/21/17   Clegg, Amy D, NP  ascorbic acid (VITAMIN C) 1000 MG tablet Take 1,000 mg by mouth daily at 12 noon.     [provider]  atorvastatin (LIPITOR) 40 MG tablet Take 1 tablet (40 mg total) by mouth daily. 01/01/17   Clegg, Amy D, NP  benzonatate (TESSALON) 100 MG capsule Take 1 capsule (100 mg total) by mouth 3 (three) times daily as needed for cough. Patient not taking: Reported on 06/13/2017 01/23/17   Larey Dresser, MD  busPIRone (BUSPAR) 5 MG tablet Take 1 tablet (5 mg total) by mouth 2 (two) times daily. 01/01/17   Clegg, Amy D, NP  docusate sodium (COLACE) 100  MG capsule Take 1 capsule (100 mg total) by mouth 2 (two) times daily. 04/03/17   Larey Dresser, MD  fluticasone (FLOVENT HFA) 110 MCG/ACT inhaler Inhale 2 puffs into the lungs 2 (two) times daily. 11/27/16   [name], MD  gabapentin (NEURONTIN) 600 MG tablet Take 0.5 tablets (300 mg total) by mouth 2 (two) times daily. 01/01/17   Clegg, Amy D, NP  insulin glargine (LANTUS) 100 unit/mL SOPN Inject 0.2 mLs (20 Units total) into the skin at bedtime. 01/10/17   Larey Dresser, MD  levothyroxine (SYNTHROID, LEVOTHROID) 25 MCG tablet Take 1 tablet (25 mcg total) by mouth daily before breakfast. 01/01/17   Clegg, Amy D, NP  metolazone (ZAROXOLYN) 2.5 MG tablet Take 1 tablet (2.5 mg total) by mouth once a week. 03/29/17 03/29/18  Rogelia Mire, NP  Multiple Vitamin (MULTIVITAMIN WITH MINERALS) TABS tablet Take 1 tablet by mouth daily at 12 noon.    [provider]  pantoprazole (PROTONIX) 40 MG tablet Take 1 tablet (40 mg total) by mouth daily at 12 noon. 01/01/17   Clegg, Amy D, NP  potassium chloride SA (K-DUR,KLOR-CON) 20 MEQ tablet TAKE 2 TABLETS BY MOUTH DAILY WITH AN ADDITIONAL 1 TABLET ONCE WEEKLY ON METOLAZONE DAYS 04/01/17   Rainy Rothman, Shaune Pascal, MD  sildenafil (REVATIO) 20 MG tablet Take 2 tablets (40 mg total) by mouth 3 (three) times daily. 03/29/17   Rogelia Mire, NP  torsemide (DEMADEX) 20 MG tablet Take 2 tablets (40 mg total) by mouth daily. Patient taking differently: Take 60 mg by mouth daily.  06/03/17 10/16/17  Larey Dresser, MD  traMADol (ULTRAM) 50 MG tablet TAKE 1 TABLET BY MOUTH EVERY 6 HOURS AS NEEDED FOR MODERATE PAIN 06/10/17   Shirley Friar, PA-C  traZODone (DESYREL) 50 MG tablet TAKE 1 TABLET(50 MG) BY MOUTH AT BEDTIME AS NEEDED FOR SLEEP 05/12/17   Larey Dresser, MD  warfarin (COUMADIN) 5 MG tablet Take 5 mg daily except 7.5 mg (1 and 1/2 tablet) on Tuesday.    [provider]    Past Medical History: Past Medical History:    Diagnosis Date  . AICD (automatic cardioverter/defibrillator) present   . Asthma   . CHF (congestive heart failure) ([place])   . Diabetes ([place])   . Kidney disease   . Presence of permanent cardiac pacemaker    AICD  . Sleep apnea     Past Surgical History: Past Surgical History:  Procedure Laterality Date  . CARDIAC CATHETERIZATION N/A 12/02/2016   Procedure: Right Heart Cath;  Surgeon: Larey Dresser, MD;  Location: [facility];  Service: Cardiovascular;  Laterality: N/A;  . COLONOSCOPY N/A 02/14/2017   Procedure: COLONOSCOPY;  Surgeon: Milus Banister, MD;  Location: [facility];  Service: Endoscopy;  Laterality: N/A;  . IABP INSERTION N/A 12/11/2016   Procedure: IABP Insertion;  Surgeon: Larey Dresser, MD;  Location: [facility];  Service: Cardiovascular;  Laterality: N/A;  . INSERTION OF IMPLANTABLE LEFT VENTRICULAR ASSIST DEVICE N/A 12/13/2016   Procedure: INSERTION OF IMPLANTABLE LEFT VENTRICULAR ASSIST DEVICE;  Surgeon: Ivin Poot, MD;  Location: [facility];  Service: Open Heart Surgery;  Laterality: N/A;  HEARTMATE II  NITRIC OXIDE  . RIGHT HEART CATH N/A 12/11/2016   Procedure: Right Heart Cath;  Surgeon: Larey Dresser, MD;  Location: [facility];  Service: Cardiovascular;  Laterality: N/A;  . RIGHT HEART CATH N/A 03/27/2017   Procedure: Right Heart Cath;  Surgeon: Larey Dresser, MD;  Location: [facility];  Service: Cardiovascular;  Laterality: N/A;  . TEE WITHOUT CARDIOVERSION N/A 12/13/2016   Procedure: TRANSESOPHAGEAL ECHOCARDIOGRAM (TEE);  Surgeon: Ivin Poot, MD;  Location: [facility];  Service: Open Heart Surgery;  Laterality: N/A;  . TRICUSPID VALVE REPLACEMENT N/A 12/13/2016   Procedure: TRICUSPID VALVE REPAIR WITH EDWARDS MC 3 TRICUSPID ANNULOPLASTY RING MODEL 4900 SIZE T 28;  Surgeon: Ivin Poot, MD;  Location: [facility];  Service: Open Heart Surgery;  Laterality: N/A;    Family History: Family History  Problem Relation Age of Onset  .  Heart failure Father   . Heart attack Father   . Healthy Mother   . Diabetes Paternal Grandfather     Social History: Social History   Social History  . Marital status: Widowed    Spouse name: N/A  . Number of children: 0  . Years of education: 40   Social History Main Topics  . Smoking status: Never Smoker  . Smokeless tobacco: Never Used  . Alcohol use No  . Drug use: No  . Sexual activity: Not Currently   Other Topics Concern  . Not on file   Social History Narrative   Patient is a widow. Moved to [place] from [place], [place]. Currently lives with his sister Robson Trickey.    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     Allergies:  No Known Allergies  Objective:    Vital Signs:   Doppler BP 123/84 (103) HR 60 Wt 197.6 lbs Mean arterial Pressure 103  Physical Exam    General:  Fatigued appearing. NAD at rest.  HEENT: Normal Neck: supple. JVP up past ear. Carotids 2+ bilat; no bruits. No lymphadenopathy or thyromegaly appreciated. Cor: Mechanical heart sounds with LVAD hum present. Lungs: Diminished throughout, decrease basilar sounds. Abdomen: Distended, tight. No hepatosplenomegaly. No bruits or masses. Good bowel sounds. Driveline: C/D/I; securement device intact and driveline incorporated Extremities: no cyanosis, clubbing, rash, edema Neuro: alert & orientedx3, cranial nerves grossly intact. moves all 4 extremities w/o difficulty. Affect pleasant   Telemetry   Not yet connected  EKG   Pending  Labs    Basic Metabolic Panel:  Recent Labs Lab 06/09/17 1158  NA 132*  K 4.1  CL 95*  CO2 23  GLUCOSE 199*  BUN 81*  CREATININE 4.79*  CALCIUM 8.6*    Liver Function Tests:  Recent Labs Lab 06/09/17 1158  AST 28  ALT 23  ALKPHOS 99  BILITOT 1.0  PROT 7.8  ALBUMIN 4.0   No results for input(s): LIPASE, AMYLASE in the last 168 hours. No results for input(s): AMMONIA in the last 168 hours.  CBC:  Recent Labs Lab  06/09/17 1146 06/13/17 0931  WBC 10.3  9.6  HGB 7.9* 7.5*  HCT 23.3* 21.7*  MCV 91.0 90.8  PLT 112* 106*    Cardiac Enzymes: No results for input(s): CKTOTAL, CKMB, CKMBINDEX, TROPONINI in the last 168 hours.  BNP: BNP (last 3 results)  Recent Labs  11/28/16 1104 12/14/16 0348  BNP 1,825.3* 427.7*    ProBNP (last 3 results)  Recent Labs  11/27/16 1207  PROBNP 1,746.0*     CBG: No results for input(s): GLUCAP in the last 168 hours.  Coagulation Studies:  Recent Labs  06/11/17  INR 1.8    Other results: EKG: pending  Imaging     No results found.  Patient Profile:   Trevor [name] a 71 y.o.malewith a history of CAD s/p CABG x 4 2010, OSA, AS with TAVR 2015, CKD, DM2, paroxysmal atrial fibrillation, asthma, and ischemic cardiomyopathy with Medtronic ICD.   Being admitted with marked volume overload and AKI despite with increase in outpatient diuretics. Plan for RHC today.   Assessment/Plan:    1. Acute on chronic systolic CHF: Ischemic cardiomyopathy, EF 20-25% with RV dysfunction on 2/18 echo pre-LVAD.  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  Medtronic ICD.  Overall, he has felt much better post-LVAD.  However, he developed AK and was eventually admitted in 5/18 after RHC showed elevated filling pressures.  He was diuresed and creatinine came down. Since then, we have adjusted up and down his torsemide dose, with higher dose volume improves but creatinine worsens.  With lower dose we can make creatinine look better but he gets volume overloaded.  - Pt is markedly volume overloaded today with worsening creatinine - Will admit for diuresis and further evaluation with RHC. Will decide on diuretic regimen once RHC performed and labs result.  - Suspect significant RV dysfunction is making volume management difficult. Pt may require milrinone.  - Continue warfarin, no ASA with recent GI bleeding. Dosing per Pharm - Will likely need ramp echo this admit.  - Will  get RHC this morning. May need to start milrinone. Low threshold to use empirically for RHF if cath delayed.  2. CAD: s/p CABG.   - Denies CP. Continue atorvastatin.   3. CKD stage IV:  - Creatinine markedly elevated earlier this week.   - Have discussed with Dr. Hollie Salk who recommended proceeding to [place] before any renal replacement therapy considered.  - Follow closely with diuresis.  4. Atrial fibrillation: Chronic.   - On warfarin.   5. RV failure: - Continue Revatio 40 mg TID for now.  6. HTN:  - MAP 103 this am. Will adjust medications as needed. Markedly volume overloaded so expect diuresis will help.  7. Anemia: Baseline anemia of renal disease/chronic disease but recent bleeding from colonic AVMs.   - Has previously been on Aranesp. Held per renal.  They may need to see him this admission. - CBC pending.  8. 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  - Continue octreotide injections.  - Denies bleeding. CBC pending.  9. Skin cancer:  - On neck.  Needs excision, does not have to stop warfarin. Will address further as outpatient.   Markedly volume overload on exam. Suspect prominent component of RV failure. Will plan for RHC failure today. Confirmed plan with [name] and [name]. Will require diuresis and possible Nephrology involvement.  I reviewed the LVAD parameters from today, and compared the results to the patient's prior recorded data.  No programming changes were made.  The LVAD is  functioning within specified parameters.  The patient performs LVAD self-test daily.  LVAD interrogation was negative for any significant power changes, alarms or PI events/speed drops.  LVAD equipment check completed and is in good working order.  Back-up equipment present.   LVAD education done on emergency procedures and precautions and reviewed exit site care.  Length of Stay: 0  Annamaria Helling 06/13/2017, 10:05 AM  VAD Team Pager [phone]  (7am - 7am) +++VAD ISSUES ONLY+++   Advanced Heart Failure Team Pager [phone] (M-F; [place])  Please contact [facility] for night-coverage after hours (4p -7a ) and weekends on [email or web address] for all non- LVAD Issues  Patient seen and examined with the above-signed Advanced Practice Provider and/or Housestaff. I personally reviewed laboratory data, imaging studies and relevant notes. I independently examined the patient and formulated the important aspects of the plan. I have edited the note to reflect any of my changes or salient points. I have personally discussed the plan with the patient and/or family.  71 y/o male with iCM s/p LVAD in 2/18. Recently has struggled with worsening RV failure and A/CKD. Seen in clinic today with progressive volume overload and creatinine up to 5.7. Admitted for RHC, IV diuresis and inotropic support.   RHC revealed R>L HF but numbers somewhat better than I expected given his degree of deterioration. LVAD speed increased slightly due to elevated PCWP. Milrinone and IV lasix started. Can consider dopamine as needed. Appreciate Renal input. Suspect he will progress to ESRD soon and we will have to figure out if there is an acceptable dialysis strategy for him in setting of VAD therapy. VAD parameters reviewed personally. Will hold coumadin for now as may need HD catheter. Start heparin when INR < 2.0.  Glori Bickers, MD  10:20 PM

## 2017-06-14 ENCOUNTER — Telehealth (HOSPITAL_COMMUNITY): Payer: Self-pay | Admitting: *Deleted

## 2017-06-14 DIAGNOSIS — I5043 Acute on chronic combined systolic (congestive) and diastolic (congestive) heart failure: Secondary | ICD-10-CM

## 2017-06-14 LAB — COMPREHENSIVE METABOLIC PANEL
ALBUMIN: 3.3 g/dL — AB (ref 3.5–5.0)
ALK PHOS: 88 U/L (ref 38–126)
ALT: 19 U/L (ref 17–63)
ANION GAP: 14 (ref 5–15)
AST: 21 U/L (ref 15–41)
BILIRUBIN TOTAL: 0.8 mg/dL (ref 0.3–1.2)
BUN: 92 mg/dL — AB (ref 6–20)
CALCIUM: 8.1 mg/dL — AB (ref 8.9–10.3)
CO2: 22 mmol/L (ref 22–32)
CREATININE: 5.79 mg/dL — AB (ref 0.61–1.24)
Chloride: 94 mmol/L — ABNORMAL LOW (ref 101–111)
GFR calc Af Amer: 10 mL/min — ABNORMAL LOW (ref >60)
GFR calc non Af Amer: 9 mL/min — ABNORMAL LOW (ref >60)
GLUCOSE: 159 mg/dL — AB (ref 65–99)
Potassium: 3.6 mmol/L (ref 3.5–5.1)
Sodium: 130 mmol/L — ABNORMAL LOW (ref 135–145)
TOTAL PROTEIN: 6.5 g/dL (ref 6.5–8.1)

## 2017-06-14 LAB — IRON AND TIBC
IRON: 125 ug/dL (ref 45–182)
Saturation Ratios: 57 % — ABNORMAL HIGH (ref 17.9–39.5)
TIBC: 221 ug/dL — ABNORMAL LOW (ref 250–450)
UIBC: 96 ug/dL

## 2017-06-14 LAB — PROTIME-INR
INR: 2.3
PROTHROMBIN TIME: 25.7 s — AB (ref 11.4–15.2)

## 2017-06-14 LAB — CBC
HEMATOCRIT: 18.6 % — AB (ref 39.0–52.0)
Hemoglobin: 6.5 g/dL — CL (ref 13.0–17.0)
MCH: 31.6 pg (ref 26.0–34.0)
MCHC: 34.9 g/dL (ref 30.0–36.0)
MCV: 90.3 fL (ref 78.0–100.0)
Platelets: 108 10*3/uL — ABNORMAL LOW (ref 150–400)
RBC: 2.06 MIL/uL — AB (ref 4.22–5.81)
RDW: 15.6 % — ABNORMAL HIGH (ref 11.5–15.5)
WBC: 9.2 10*3/uL (ref 4.0–10.5)

## 2017-06-14 LAB — LACTATE DEHYDROGENASE: LDH: 270 U/L — AB (ref 98–192)

## 2017-06-14 LAB — RETICULOCYTES
RBC.: 2.76 MIL/uL — ABNORMAL LOW (ref 4.22–5.81)
RETIC COUNT ABSOLUTE: 49.7 10*3/uL (ref 19.0–186.0)
Retic Ct Pct: 1.8 % (ref 0.4–3.1)

## 2017-06-14 LAB — VITAMIN B12: Vitamin B-12: 863 pg/mL (ref 180–914)

## 2017-06-14 LAB — PREPARE RBC (CROSSMATCH)

## 2017-06-14 LAB — FOLATE: FOLATE: 32.9 ng/mL (ref >5.9)

## 2017-06-14 LAB — FERRITIN: Ferritin: 622 ng/mL — ABNORMAL HIGH (ref 24–336)

## 2017-06-14 MED ORDER — TRAZODONE HCL 50 MG PO TABS
100.0000 mg | ORAL_TABLET | Freq: Every evening | ORAL | Status: DC | PRN
  Administered 2017-06-14 – 2017-07-04 (×18): 100 mg via ORAL
  Filled 2017-06-14 (×18): qty 2

## 2017-06-14 MED ORDER — TRAMADOL HCL 50 MG PO TABS
50.0000 mg | ORAL_TABLET | Freq: Four times a day (QID) | ORAL | Status: DC | PRN
  Administered 2017-06-15 – 2017-07-07 (×24): 50 mg via ORAL
  Filled 2017-06-14 (×23): qty 1

## 2017-06-14 MED ORDER — AMLODIPINE BESYLATE 2.5 MG PO TABS
2.5000 mg | ORAL_TABLET | Freq: Every day | ORAL | Status: DC
  Administered 2017-06-15 – 2017-06-17 (×3): 2.5 mg via ORAL
  Filled 2017-06-14 (×3): qty 1

## 2017-06-14 MED ORDER — TRAMADOL HCL 50 MG PO TABS: 75.0000 mg | ORAL_TABLET | Freq: Four times a day (QID) | ORAL | Status: DC | PRN

## 2017-06-14 MED ORDER — POTASSIUM CHLORIDE CRYS ER 20 MEQ PO TBCR
20.0000 meq | EXTENDED_RELEASE_TABLET | Freq: Once | ORAL | Status: AC
  Administered 2017-06-14: 20 meq via ORAL
  Filled 2017-06-14: qty 1

## 2017-06-14 MED ORDER — SODIUM CHLORIDE 0.9 % IV SOLN: Freq: Once | INTRAVENOUS | Status: DC

## 2017-06-14 NOTE — Progress Notes (Signed)
CRITICAL VALUE ALERT  Critical Value:  Hgb 6.5  Date & Time Notied:  06/14/17 0420  Provider Notified: Lolita Patella VAD coordinator   Orders Received/Actions taken:  Lolita Patella spoke with [name] and called me this morning at 0630 with transfusion orders

## 2017-06-14 NOTE — Telephone Encounter (Signed)
LATE ENTRY: Received a page from RN at 0430 reporting hemoglobin of 6.5. Order given to collect a type and screen. MAP normal and PI around 2.5. Dr Haroldine Laws notified and order obtained for 2 units PRBC's.  Balinda Quails RN, VAD Coordinator 24/7 pager [phone]

## 2017-06-14 NOTE — Progress Notes (Signed)
Patient ID: Trevor [name], male   DOB: 08-18-1946, 71 y.o.   MRN: [id number]   Advanced Heart Failure VAD Team Note  Subjective:    Good UOP overnight on milrinone 0.125 + high dose Lasix.  Creatinine stable.  No dyspnea but has not walked yet.  HR in 70s, atrial fibrillation.   RHC (8/10):  RA = 17 RV = 58/18 PA = 56/25 (37) PCW = 20 (v = 35) Fick cardiac output/index = 6.4/3.2 Thermo CO/CI = 4.1/2.1 PVR = 2.6 Ao sat = 98% PA sat = 56%, 60% Increased LVAD speed to 9600   PCW down to 19  LVAD INTERROGATION:  HeartMate II LVAD:  Flow 6.5 liters/min, speed 9600, power 6.8, PI 3.4.  No PI events.   Objective:    Vital Signs:   Temp:  [97.2 F (36.2 C)-98 F (36.7 C)] 97.2 F (36.2 C) (08/11 0840) Pulse Rate:  [0-208] 59 (08/11 0900) Resp:  [0-44] 10 (08/11 0900) BP: (67-137)/(52-108) 98/82 (08/11 0900) SpO2:  [0 %-100 %] 96 % (08/11 0903) Weight:  [197 lb 8.5 oz (89.6 kg)] 197 lb 8.5 oz (89.6 kg) (08/10 1200) Last BM Date: 06/13/17 Mean arterial Pressure 70s-80s  Intake/Output:   Intake/Output Summary (Last 24 hours) at 06/14/17 0927 Last data filed at 06/14/17 0800  Gross per 24 hour  Intake           126.98 ml  Output             3350 ml  Net         -3223.02 ml     Physical Exam    General:  Well appearing. No resp difficulty HEENT: normal Neck: supple. JVP 12-14. Carotids 2+ bilat; no bruits. No lymphadenopathy or thyromegaly appreciated. Cor: Mechanical heart sounds with LVAD hum present. Lungs: clear Abdomen: soft, nontender, nondistended. No hepatosplenomegaly. No bruits or masses. Good bowel sounds. Driveline: C/D/I; securement device intact and driveline incorporated Extremities: no cyanosis, clubbing, rash.  2+ edema to knees.  Neuro: alert & orientedx3, cranial nerves grossly intact. moves all 4 extremities w/o difficulty. Affect pleasant   Telemetry   Personally reviewed, atrial fibrillation in 70s  Labs   Basic Metabolic Panel:  Recent  Labs Lab 06/09/17 1158 06/13/17 0931 06/13/17 1439 06/14/17 0255  NA 132* 133* 131* 130*  K 4.1 4.3 4.0 3.6  CL 95* 96* 94* 94*  CO2 23 22 23 22   GLUCOSE 199* 162* 132* 159*  BUN 81* 91* 93* 92*  CREATININE 4.79* 5.80* 5.73* 5.79*  CALCIUM 8.6* 8.2* 8.2* 8.1*    Liver Function Tests:  Recent Labs Lab 06/09/17 1158 06/13/17 0931 06/14/17 0255  AST 28 28 21   ALT 23 23 19   ALKPHOS 99 102 88  BILITOT 1.0 1.1 0.8  PROT 7.8 7.2 6.5  ALBUMIN 4.0 3.8 3.3*   No results for input(s): LIPASE, AMYLASE in the last 168 hours. No results for input(s): AMMONIA in the last 168 hours.  CBC:  Recent Labs Lab 06/09/17 1146 06/13/17 0931 06/13/17 1439 06/14/17 0255  WBC 10.3 9.6 10.7* 9.2  HGB 7.9* 7.5* 7.4* 6.5*  HCT 23.3* 21.7* 21.6* 18.6*  MCV 91.0 90.8 90.4 90.3  PLT 112* 106* 113* 108*    INR:  Recent Labs Lab 06/09/17 1146 06/11/17 06/13/17 0931 06/14/17 0255  INR 3.22 1.8 2.15 2.30    Other results:  EKG:    Imaging   Dg Chest 2 View  Result Date: 06/13/2017 CLINICAL DATA:  Acute onset  of shortness of breath. Heart failure. Left ventricular assist device placed in February. Initial encounter. EXAM: CHEST  2 VIEW COMPARISON:  Chest radiograph performed 03/27/2017 FINDINGS: The lungs are well-aerated. Mild vascular congestion is noted. There is no evidence of pleural effusion or pneumothorax. The heart is borderline enlarged. The patient is status post median sternotomy. An aortic valve replacement is noted. A left ventricular assist device is seen. An AICD is noted at the right chest wall, with a single lead ending at the right ventricle. No acute osseous abnormalities are seen. IMPRESSION: Mild vascular congestion and borderline cardiomegaly. Lungs remain relatively clear. Electronically Signed   By: Garald Balding M.D.   On: 06/13/2017 21:40      Medications:     Scheduled Medications: . [START ON 06/15/2017] amLODipine  2.5 mg Oral Daily  . atorvastatin   40 mg Oral Daily  . budesonide  0.25 mg Nebulization BID  . busPIRone  5 mg Oral BID  . darbepoetin (ARANESP) injection - NON-DIALYSIS  150 mcg Subcutaneous Q Fri-1800  . docusate sodium  100 mg Oral BID  . gabapentin  300 mg Oral BID  . levothyroxine  25 mcg Oral QAC breakfast  . multivitamin with minerals  1 tablet Oral Q1200  . pantoprazole  40 mg Oral Q1200  . sildenafil  40 mg Oral TID  . sodium chloride flush  3 mL Intravenous Q12H  . ascorbic acid  1,000 mg Oral Q1200     Infusions: . sodium chloride    . sodium chloride    . furosemide Stopped (06/13/17 1547)  . milrinone 0.125 mcg/kg/min (06/14/17 0800)     PRN Medications:  sodium chloride, acetaminophen, albuterol, ondansetron (ZOFRAN) IV, sodium chloride flush, traMADol, traZODone   Patient Profile   Trevor Kimball Murphyis a 71 y.o.malewith a history of CAD s/p CABG x 4 2010, OSA, AS with TAVR 2015, CKD, DM2, paroxysmal atrial fibrillation, asthma, and ischemic cardiomyopathy with Medtronic ICD.   He admitted with marked volume overload and AKI despite with increase in outpatient diuretics.    Assessment/Plan:    1. Acute on chronic systolic CHF: Ischemic cardiomyopathy, EF 20-25% with RV dysfunction on 2/18 echo pre-LVAD. 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 Medtronic ICD. Overall, he has felt much better post-LVAD. However, he developed AKI and was eventually admitted in 5/18 after RHC showed elevated filling pressures. He was diuresed and creatinine came down. Since then, we have adjusted up and down his torsemide dose, with higher dose volume improves but creatinine worsens. With lower dose we can make creatinine look better but he gets volume overloaded.  He has again developed worsening volume overload with AKI, creatinine up to 5.79.  He had RHC on 8/10 as above and milrinone 0.125 started.  IV Lasix begun, appears to be diuresing well with stable creatinine so far.  Speed increased to 9600 on 8/10.   -  Suspect significant RV dysfunction is making volume management difficult.  Increase milrinone up to 0.25, he has tolerated so far without rise in HR.  - Continue warfarin, no ASA with h/o GI bleeding. Dosing per Pharm (aim for 2-2.5).  2. CAD: s/p CABG. Denies CP. Continue atorvastatin.   3. CKD stage IV: Creatinine up to 5.7.  He is diuresing with IV Lasix and milrinone, creatinine remains elevated. Would strongly consider trial of HD if renal function continues to worsen, he has had stable BP throughout.  Will need to discuss with nephrology.    4. Atrial fibrillation: Chronic.  On warfarin. Rate ok on milrinone.  5. RV failure: Continue Revatio 40 mg TID.  He has started on milrinone.  6. HTN: MAP stable 70s-80s.  7. Anemia: Baseline anemia of renal disease/chronic disease but recent bleeding from colonic AVMs. No overt bleeding, may be primarily anemia of renal disease at this point. Hemoglobin 6.5 currently, will transfuse 2 units PRBCs.  8. 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  No overt bleeding.  Hemoglobin low and getting PRBCs, may be related to anemia of renal disease.  - Continue octreotide injections.  9. Skin cancer: On neck. Needs excision, does not have to stop warfarin. Will address further as outpatient.   I reviewed the LVAD parameters from today, and compared the results to the patient's prior recorded data.  No programming changes were made.  The LVAD is functioning within specified parameters.  The patient performs LVAD self-test daily.  LVAD interrogation was negative for any significant power changes, alarms or PI events/speed drops.  LVAD equipment check completed and is in good working order.  Back-up equipment present.   LVAD education done on emergency procedures and precautions and reviewed exit site care.  Length of Stay: 1  Loralie Champagne, MD 06/14/2017, 9:27 AM  VAD Team --- VAD ISSUES ONLY--- Pager [phone] (7am -  7am)  Advanced Heart Failure Team  Pager [phone] (M-F; 7a - 4p)  Please contact [facility] for night-coverage after hours (4p -7a ) and weekends on [email or web address]

## 2017-06-14 NOTE — Progress Notes (Addendum)
[facility] Rounding Note  Subjective/Interval History:  Reasonable night No new c/o Excellent UOP (2.9 liters) with lasix 160 BID, milrinone and no worsening of renal function Hb down to 6.5 - PRBC's Have started Aranesp and checking anemia panel  Objective Vital signs in last 24 hours: Vitals:   06/14/17 0800 06/14/17 0840 06/14/17 0900 06/14/17 0903  BP: 96/82  98/82   Pulse: 79 73 (!) 59   Resp: 12 13 10    Temp: (!) 97.5 F (36.4 C) (!) 97.2 F (36.2 C)    TempSrc: Oral Axillary    SpO2: 97% 99% 97% 96%  Weight:      Height:       Weight change:   Intake/Output Summary (Last 24 hours) at 06/14/17 1009 Last data filed at 06/14/17 0800  Gross per 24 hour  Intake           126.98 ml  Output             3350 ml  Net         -3223.02 ml   Physical Exam:  Blood pressure 98/82, pulse (!) 59, temperature (!) 97.2 F (36.2 C), temperature source Axillary, resp. rate 10, height 5\' 5"  (1.651 m), weight 89.6 kg (197 lb 8.5 oz), SpO2 96 %.  Delightful man, NAD Getting PRBC's for Hb 6.5 AFib rate controlled 70's JVD to jaw angle Lungs clear Mechanical heart sounds/LVAD continuous hum Abdomen protuberant. LVAD drive line clean and dry 2+ edema LE's Alert, oriented, delightful   Recent Labs Lab 06/09/17 1158 06/13/17 0931 06/13/17 1439 06/14/17 0255  NA 132* 133* 131* 130*  K 4.1 4.3 4.0 3.6  CL 95* 96* 94* 94*  CO2 23 22 23 22   GLUCOSE 199* 162* 132* 159*  BUN 81* 91* 93* 92*  CREATININE 4.79* 5.80* 5.73* 5.79*  CALCIUM 8.6* 8.2* 8.2* 8.1*     Recent Labs Lab 06/09/17 1158 06/13/17 0931 06/14/17 0255  AST 28 28 21   ALT 23 23 19   ALKPHOS 99 102 88  BILITOT 1.0 1.1 0.8  PROT 7.8 7.2 6.5  ALBUMIN 4.0 3.8 3.3*     Recent Labs Lab 06/09/17 1146 06/13/17 0931 06/13/17 1439 06/14/17 0255  WBC 10.3 9.6 10.7* 9.2  HGB 7.9* 7.5* 7.4* 6.5*  HCT 23.3* 21.7* 21.6* 18.6*  MCV 91.0 90.8 90.4 90.3  PLT 112* 106* 113* 108*    Lab Results  Component Value Date    INR 2.30 06/14/2017   INR 2.15 06/13/2017   INR 1.8 06/11/2017    Dg Chest 2 View  Result Date: 06/13/2017 CLINICAL DATA:  Acute onset of shortness of breath. Heart failure. Left ventricular assist device placed in February. Initial encounter. EXAM: CHEST  2 VIEW COMPARISON:  Chest radiograph performed 03/27/2017 FINDINGS: The lungs are well-aerated. Mild vascular congestion is noted. There is no evidence of pleural effusion or pneumothorax. The heart is borderline enlarged. The patient is status post median sternotomy. An aortic valve replacement is noted. A left ventricular assist device is seen. An AICD is noted at the right chest wall, with a single lead ending at the right ventricle. No acute osseous abnormalities are seen. IMPRESSION: Mild vascular congestion and borderline cardiomegaly. Lungs remain relatively clear. Electronically Signed   By: Garald Balding M.D.   On: 06/13/2017 21:40   Medications: . sodium chloride    . sodium chloride    . furosemide Stopped (06/13/17 1547)  . milrinone 0.125 mcg/kg/min (06/14/17 0800)   . [START ON  06/15/2017] amLODipine  2.5 mg Oral Daily  . atorvastatin  40 mg Oral Daily  . budesonide  0.25 mg Nebulization BID  . busPIRone  5 mg Oral BID  . darbepoetin (ARANESP) injection - NON-DIALYSIS  150 mcg Subcutaneous Q Fri-1800  . docusate sodium  100 mg Oral BID  . gabapentin  300 mg Oral BID  . levothyroxine  25 mcg Oral QAC breakfast  . multivitamin with minerals  1 tablet Oral Q1200  . pantoprazole  40 mg Oral Q1200  . sildenafil  40 mg Oral TID  . sodium chloride flush  3 mL Intravenous Q12H  . ascorbic acid  1,000 mg Oral Q1200    Assessment/Recommendations  Background:  71 y/o M with extensive cardiac history including ischemic cardiomyopathy s/p LVAD + ICD placement 12/2016 and CKD stage 4 (Dr. Hollie Salk follows) adm with volume overload and worsening renal function (BL mid 3's ->5.8). Difficulty in the past several weeks finding balance  between acceptable diuresis and renal function. RHC 8/10 R>L HF/decreased CO 2/2 cardiorenal syndrome. LVAD speed increased to 9600. Milrinone +/- dopamine & Lasix 160 BID  initiated. We are asked to see.  Assessment/Recommendations  1.  AKI on CKD4 2/2 Cardiorenal Syndrome: CW 197 lbs, DW ~180 lbs. Volume overloaded/wt up 17 lb on admission from what is considered his EDW (180 = IBW)  Cr markedly elevated at 5.8. BUN 91, higher than recent BL mid 3's. Lytes stable. Follows with Dr. Clifton Custard most recent office records. Has never required HD before but was told that he would not be a good candidate 2/2 biven HF/LVAD. PD  not felt technically feasible d/t LVAD drive line.   Good UOP, no real change in renal fx  Discussed with [name] - LVAD coordinator here aware of [facility] pts at [facility] who were able to undergo HD.   No protocol in place for LVAD pts in our outpt dialysis units that I am aware of but need to explore (on Monday) as this otherwise very functional man would be candidate if turned out to be option.   [name] will find out details from the LVAD coordinator on Monday as well In the meantime follow clinical response to lasix and milrinone.  2.  Acute on Chronic Systolic HF, Ischemic Cardiomyopathy s/p LVAD (most recent echo 03/2017  20-25%, 5-10% prior to placement of LVAD) + ICD 12/2016. 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  Chronic atrial fibrillation: Warfarin. Pharmacy dosing 4.  Hypothyroidism: Synthroid 25 mcg/day 5.  HLD: Atorvastatin 40mg  6.  HTN: Amlodipine 5mg  7.  Type 2 Diabetes with Diabetic Neuropathy: On Gabapentin 300mg  BID. Watch for side effects with declining RF 8.  COPD: Pulmicort nebs BID 9.  History of GI bleed: Recent (02/2017) GI bleed from colonic AVMs which were cauterized+ clipped. Denied hematochezia or melena.  10. Anemia: Hb 7.5 on admission, 6.5 today.    Aranesp 150mg  Q4weeks planned per Dr. Hollie Salk but pt hospitalized in interim.  Will dose Aranesp weekly here (dosed 8/10)  Transfusing today for Hb 6.5  Repeat anemia panel 11. Contact precautions  (h/o MDR acinetobacter 12/2016)  Jamal Maes, MD [facility] [phone] pager 06/14/2017, 10:09 AM

## 2017-06-14 NOTE — Progress Notes (Signed)
CARDIAC REHAB PHASE I   PRE:  Rate/Rhythm: afib 82  BP:  Supine:   Sitting: 88/66 Map 80  Standing:    SaO2: 95 RA  MODE:  Ambulation: 250 ft multiple standing rest breaks  POST:  Rate/Rhythm: afib 110  BP:  Supine:   Sitting: 109/67 Map 98  Standing:    SaO2: 96 RA  Pt well known to cardiac rehab staff.  Assisted pt to ambulate 250 feet with multiple standing rest breaks x 1 assist with extra batteries and clip in black bag.  Pt able to switch batteries to portable clips with no assistance.  Pt tolerated well with some fatigue towards the end of the walk.  Pt with slight shortness of breath but pt remarks "not as bad" as before.  Pt back to his room sitting on the side of the bed.  Pt switched portable batteries to bedside unit.  Pt preferred to keep his holster on for the main controller to keep the drive line from pulling while he is bed.  Pt is in good spirits and joked with staff.  Pt advised to ambulate in the hallway with nursing staff on tomorrow.  Cardiac rehab will follow back up on Monday for progressive ambulation. Maurice Small RN, BSN Cardiac and Training and development officer  [phone]

## 2017-06-14 NOTE — Plan of Care (Signed)
Problem: Activity: Goal: Capacity to carry out activities will improve Outcome: Progressing Ambulated 242ft in hallways with cardiac rehab

## 2017-06-14 NOTE — Progress Notes (Signed)
[place] for warfarin>>heparin Indication: LVAD  No Known Allergies  Patient Measurements: Height: 5\' 5"  (165.1 cm) Weight: 197 lb 8.5 oz (89.6 kg) IBW/kg (Calculated) : 61.5   Vital Signs: Temp: 97.2 F (36.2 C) (08/11 0840) Temp Source: Axillary (08/11 0840) BP: 98/82 (08/11 0900) Pulse Rate: 59 (08/11 0900)  Labs:  Recent Labs  06/13/17 0931 06/13/17 1439 06/14/17 0255  HGB 7.5* 7.4* 6.5*  HCT 21.7* 21.6* 18.6*  PLT 106* 113* 108*  LABPROT 24.4*  --  25.7*  INR 2.15  --  2.30  CREATININE 5.80* 5.73* 5.79*    Estimated Creatinine Clearance: 12 mL/min (A) (by C-G formula based on SCr of 5.79 mg/dL (H)).   Medical History: Past Medical History:  Diagnosis Date  . AICD (automatic cardioverter/defibrillator) present   . Asthma   . CHF (congestive heart failure) ([place])   . Diabetes ([place])   . Kidney disease   . Presence of permanent cardiac pacemaker    AICD  . Sleep apnea    Assessment: 70 y.o.malewith a history of CAD s/p CABG x 4 2010, OSA, AS with TAVR 2015, CKD, DM2, paroxysmal atrial fibrillation, asthma, and ischemic cardiomyopathy with [employer] ICD.   Pt on warfarin PTA now with plans to start heparin once INR < 2. INR on admit 2.15 now up to 2.30.  Anti-coag outpatient visit on 8/8 shows INR of 1.8, dose unchanged at that time, continue 5mg  daily except 7.5mg  on Tuesdays. Recent GIB so he is not on asa.  Goal of Therapy:  INR goal 2-2.5  Heparin Level 0.3-0.7 Monitor platelets by anticoagulation protocol: Yes   Plan:  -Hold warfarin for now -Start heparin when INR < 2 -Daily INR  Arrie Senate, PharmD PGY-2 Cardiology Pharmacy Resident Pager: [phone] 06/14/2017

## 2017-06-14 NOTE — Progress Notes (Addendum)
Chaplain checked in on patient who likes to be called Trevor [name].  Patient said he was feeling some better today.  Patient jokingly tells Chaplain that "Mr. Sevey was my father".  Patient says he wasn't much of a church goer but he loves walking outside at night and seeing God as he looks up at all the beautiful stars.    Chaplain and patient prayed together and continued to chat about his current stay in the hospital.  Patient says he is not running laps but he has walked all the way down the hallway.  Patient is appreciative of the care he is receiving from his medical team.    Chaplain would also like to thank the medical team for their care for this patient.  Please page Chaplain should additional care be needed for this patient.    06/14/17 2147  Clinical Encounter Type  Visited With Patient  Visit Type Initial;Spiritual support;Social support  Referral From Chaplain  Consult/Referral To Chaplain  Recommendations (Will continue to follow)  Spiritual Encounters  Spiritual Needs Prayer

## 2017-06-15 LAB — TYPE AND SCREEN
ABO/RH(D): A NEG
ANTIBODY SCREEN: NEGATIVE
Unit division: 0
Unit division: 0

## 2017-06-15 LAB — RENAL FUNCTION PANEL
Albumin: 3.4 g/dL — ABNORMAL LOW (ref 3.5–5.0)
Anion gap: 15 (ref 5–15)
BUN: 93 mg/dL — AB (ref 6–20)
CALCIUM: 8 mg/dL — AB (ref 8.9–10.3)
CHLORIDE: 90 mmol/L — AB (ref 101–111)
CO2: 23 mmol/L (ref 22–32)
CREATININE: 6.08 mg/dL — AB (ref 0.61–1.24)
GFR calc non Af Amer: 8 mL/min — ABNORMAL LOW (ref >60)
GFR, EST AFRICAN AMERICAN: 10 mL/min — AB (ref >60)
Glucose, Bld: 220 mg/dL — ABNORMAL HIGH (ref 65–99)
Phosphorus: 7.4 mg/dL — ABNORMAL HIGH (ref 2.5–4.6)
Potassium: 3.8 mmol/L (ref 3.5–5.1)
SODIUM: 128 mmol/L — AB (ref 135–145)

## 2017-06-15 LAB — LACTATE DEHYDROGENASE: LDH: 295 U/L — AB (ref 98–192)

## 2017-06-15 LAB — PROTIME-INR
INR: 2.04
INR: 1.94
PROTHROMBIN TIME: 23.3 s — AB (ref 11.4–15.2)
Prothrombin Time: 22.4 seconds — ABNORMAL HIGH (ref 11.4–15.2)

## 2017-06-15 LAB — BPAM RBC
BLOOD PRODUCT EXPIRATION DATE: 201808222359
BLOOD PRODUCT EXPIRATION DATE: 201808282359
ISSUE DATE / TIME: 201808110813
ISSUE DATE / TIME: 201808111128
UNIT TYPE AND RH: 600
Unit Type and Rh: 600

## 2017-06-15 LAB — CBC
HCT: 24.3 % — ABNORMAL LOW (ref 39.0–52.0)
Hemoglobin: 8.6 g/dL — ABNORMAL LOW (ref 13.0–17.0)
MCH: 30.9 pg (ref 26.0–34.0)
MCHC: 35.4 g/dL (ref 30.0–36.0)
MCV: 87.4 fL (ref 78.0–100.0)
PLATELETS: 110 10*3/uL — AB (ref 150–400)
RBC: 2.78 MIL/uL — AB (ref 4.22–5.81)
RDW: 15.2 % (ref 11.5–15.5)
WBC: 13.3 10*3/uL — AB (ref 4.0–10.5)

## 2017-06-15 LAB — HEPARIN LEVEL (UNFRACTIONATED): Heparin Unfractionated: 0.26 IU/mL — ABNORMAL LOW (ref 0.30–0.70)

## 2017-06-15 LAB — OCCULT BLOOD X 1 CARD TO LAB, STOOL: Fecal Occult Bld: NEGATIVE

## 2017-06-15 MED ORDER — HEPARIN (PORCINE) IN NACL 100-0.45 UNIT/ML-% IJ SOLN
1300.0000 [IU]/h | INTRAMUSCULAR | Status: DC
  Administered 2017-06-15: 1200 [IU]/h via INTRAVENOUS
  Administered 2017-06-16 – 2017-06-17 (×3): 1300 [IU]/h via INTRAVENOUS
  Filled 2017-06-15 (×3): qty 250

## 2017-06-15 MED ORDER — METOLAZONE 5 MG PO TABS
5.0000 mg | ORAL_TABLET | Freq: Once | ORAL | Status: AC
  Administered 2017-06-15: 5 mg via ORAL
  Filled 2017-06-15: qty 1

## 2017-06-15 MED ORDER — SEVELAMER CARBONATE 800 MG PO TABS
800.0000 mg | ORAL_TABLET | Freq: Three times a day (TID) | ORAL | Status: DC
  Administered 2017-06-15 – 2017-06-19 (×12): 800 mg via ORAL
  Filled 2017-06-15 (×12): qty 1

## 2017-06-15 NOTE — Progress Notes (Addendum)
[facility] Rounding Note  Subjective/Interval History:  Feels OK. No new complaints.  Notes hes going to get metolazone today. Feels his UOP has decreased.  Recorded UOP 2.3L yesterday with lasix 160mg  BID and milrionone.  456mLs out so far today.  Cr up today at 6 from 5.8 on admission.  Hb 8.6 today after receiving 2 units PRBs yesterday.  Aranesp started. Iron studies returned with Iron 125, TIBC 221, Sat ratio 57 and Ferritin 622.  Worked with cardiac rehab yesterday  Phosphorus 7.4.   Objective Vital signs in last 24 hours: Vitals:   06/15/17 0300 06/15/17 0400 06/15/17 0500 06/15/17 0600  BP: (!) 123/94 93/61    Pulse: (!) 105 82 85 (!) 35  Resp: (!) 21 14 13 13   Temp:  97.8 F (36.6 C)    TempSrc:  Oral    SpO2: 95% 98% (!) 86% 95%  Weight:    197 lb 5 oz (89.5 kg)  Height:       Weight change: -3.5 oz (-0.1 kg)  Intake/Output Summary (Last 24 hours) at 06/15/17 0818 Last data filed at 06/15/17 0700  Gross per 24 hour  Intake           1909.5 ml  Output             2286 ml  Net            -373 mL   Physical Exam:  Very pleasant and delightful man, just got done eating breakfast, NAD AFib rate controlled 70's JVD to jaw angle Lungs clear Mechanical heart sounds/LVAD continuous hum Abdomen protuberant. 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    Recent Labs Lab 06/09/17 1158 06/13/17 0931 06/13/17 1439 06/14/17 0255 06/15/17 0345  NA 132* 133* 131* 130* 128*  K 4.1 4.3 4.0 3.6 3.8  CL 95* 96* 94* 94* 90*  CO2 23 22 23 22 23   GLUCOSE 199* 162* 132* 159* 220*  BUN 81* 91* 93* 92* 93*  CREATININE 4.79* 5.80* 5.73* 5.79* 6.08*  CALCIUM 8.6* 8.2* 8.2* 8.1* 8.0*  PHOS  --   --   --   --  7.4*    Recent Labs Lab 06/09/17 1158 06/13/17 0931 06/14/17 0255 06/15/17 0345  AST 28 28 21   --   ALT 23 23 19   --    ALKPHOS 99 102 88  --   BILITOT 1.0 1.1 0.8  --   PROT 7.8 7.2 6.5  --   ALBUMIN 4.0 3.8 3.3* 3.4*     Recent Labs Lab 06/13/17 0931 06/13/17 1439 06/14/17 0255 06/15/17 0345  WBC 9.6 10.7* 9.2 13.3*  HGB 7.5* 7.4* 6.5* 8.6*  HCT 21.7* 21.6* 18.6* 24.3*  MCV 90.8 90.4 90.3 87.4  PLT 106* 113* 108* 110*    Lab Results  Component Value Date   INR 2.04 06/15/2017   INR 2.30 06/14/2017   INR 2.15 06/13/2017    Medications: . sodium chloride    . sodium chloride    . furosemide Stopped (06/14/17 2142)  . milrinone 0.25 mcg/kg/min (06/15/17 0405)   . amLODipine  2.5 mg Oral Daily  . atorvastatin  40 mg Oral Daily  . budesonide  0.25 mg Nebulization BID  . busPIRone  5 mg Oral BID  . darbepoetin (ARANESP) injection - NON-DIALYSIS  150 mcg Subcutaneous Q Fri-1800  . docusate  sodium  100 mg Oral BID  . gabapentin  300 mg Oral BID  . levothyroxine  25 mcg Oral QAC breakfast  . multivitamin with minerals  1 tablet Oral Q1200  . pantoprazole  40 mg Oral Q1200  . sildenafil  40 mg Oral TID  . sodium chloride flush  3 mL Intravenous Q12H  . ascorbic acid  1,000 mg Oral Q1200    Assessment/Recommendations  Background:  71 y/o M with extensive cardiac history including ischemic cardiomyopathy s/p LVAD + ICD placement 12/2016 and CKD stage 4 (Dr. Hollie Salk follows) adm with volume overload and worsening renal function (BL mid 3's ->5.8). Difficulty in the past several weeks finding balance between acceptable diuresis and renal function. RHC 8/10 R>L HF/decreased CO 2/2 cardiorenal syndrome. LVAD speed increased to 9600. Milrinone +/- dopamine & Lasix 160 BID  initiated. We are asked to see.  Assessment/Recommendations  1.  AKI on CKD4 2/2 Cardiorenal Syndrome: CW 197 lbs, DW ~180 lbs. Wt up 17 lb on admission from what is considered his EDW (180 = IBW)  Admission Cr markedly elevated at 5.8. BUN 91, higher than recent BL mid 3's. Follows with Dr. Clifton Custard most recent office  records. Has never required HD before but was told that he would not be a good candidate 2/2 bivent HF/LVAD. PD not felt technically feasible d/t LVAD drive line.   Good UOP, renal function a little worse today.  Currently following clinical response to lasix and milrinone.   Discussion had with [name] (LVAD coordinator) here - aware of 4 LVAD pts at [facility] who were able to under go HD and a couple who had PD (despite drive line).   Literature review with several case reports of successful HD AND PD in LVAD pt.   Authors voice concern about AVF maturation due to lack of pulsatile flow, and TDC not felt to be best access options d/t infection risk.   We have never had LVAD HD outpt in [place] and not sure if any guidelines at [facility] units for this however needs to be explored further on Monday. If not [place], the [facility] may have options.   He is an otherwise very functional man and would be a candidate if this turned out to be an option.  [name] to get details from LVAD coordinator Monday as well.  2. Hyperphosphatemia: Phos 7.4 today  Start renvela.    3. Acute on Chronic Systolic HF, Ischemic Cardiomyopathy s/p LVAD (most recent echo 03/2017  20-25%, 5-10% prior to placement of LVAD) + ICD 12/2016. Prominent RV failure on RHC 8/10  Lasix per HF   Milrinone per HF  Sildenafil 40 mg TID for RV failure  LVAD pump speed ^9600 8/10 4.  Chronic atrial fibrillation: Warfarin. Pharmacy dosing 5.  Hypothyroidism: Synthroid 25 mcg/day 6.  HLD: Atorvastatin 40mg  7.  HTN: Amlodipine 5mg  8.  Type 2 Diabetes with Diabetic Neuropathy: On Gabapentin 300mg  BID. Watch for side effects with declining RF 9.  COPD: Pulmicort nebs BID 10.  History of GI bleed: Recent (02/2017) GI bleed from colonic AVMs which were cauterized+ clipped. Denied hematochezia or melena.   Continue octreotide 11. Anemia: Hb 7.5 on admission, 6.5  Caryl Comes appropriately to 8.6 after 2 units PRB  Started Aranesp, given weekly. Started  8/10  Anemia panel with Iron 125, Sat ratio 57 and ferritin 622. B12 and folate normal. 12. Contact precautions  (h/o MDR acinetobacter/pseudomonas 12/2016)  Einar Gip, DO [facility] [phone] pager  06/15/2017, 8:11 AM   I have seen and examined this patient and agree with plan and assessment in the above note with renal recommendations/intervention highlighted. Creatinine creeping up, diuretic response not robust but is making urine. He has been told recently that there are no dialysis options for him, but there is a precedent (case reports/[facility] experience) for RRT in LVAD pts who have done both HD and PD (despite the drive line). Will need to clarify if any guidelines in our outpt HD units for LVAD pts and if not, [name] will inquire at [facility] where they do have some experience with that. He has been a quite functional individual and if there is any option I feel it should be offered. Management issues - we have started Aranesp for anemia (required transfusion yesterday), Dr. Marigene Ehlers giving dose of metolazone. Has hand pain across left knuckles but also large joints R foot raising ?of gout so have sent uric acid level.  Hyperphosphatemia - added binder/changed to renal (low phos) diet.  Envi Eagleson B,MD 06/15/2017 10:37 AM

## 2017-06-15 NOTE — Progress Notes (Signed)
Patient ID: Trevor [name], male   DOB: 03/05/1946, 71 y.o.   MRN: [id number]   Advanced Heart Failure VAD Team Note  Subjective:    Some UOP yesterday but not marked on Lasix 160 IV bid.  Stable BUN but creatinine 6.08.  Legs still swollen.  Pain in left hand at IV site, likely phlebitis. Walked around unit yesterday, mild dyspnea.  Hemoglobin up appropriately with 2 units PRBCs on 8/11, FOBT negative.   LDH 270=>295  INR 2  RHC (8/10):  RA = 17 RV = 58/18 PA = 56/25 (37) PCW = 20 (v = 35) Fick cardiac output/index = 6.4/3.2 Thermo CO/CI = 4.1/2.1 PVR = 2.6 Ao sat = 98% PA sat = 56%, 60% Increased LVAD speed to 9600   PCW down to 19  LVAD INTERROGATION:  HeartMate II LVAD:  Flow 7 liters/min, speed 9600, power 7, PI 3.4.  No PI events.   Objective:    Vital Signs:   Temp:  [97.4 F (36.3 C)-99.1 F (37.3 C)] 97.9 F (36.6 C) (08/12 0820) Pulse Rate:  [30-110] 87 (08/12 0800) Resp:  [11-22] 12 (08/12 0800) BP: (73-125)/(51-99) 73/51 (08/12 0800) SpO2:  [86 %-98 %] 96 % (08/12 0849) Weight:  [197 lb 5 oz (89.5 kg)] 197 lb 5 oz (89.5 kg) (08/12 0600) Last BM Date: 06/15/17 Mean arterial Pressure 70s-80s  Intake/Output:   Intake/Output Summary (Last 24 hours) at 06/15/17 0902 Last data filed at 06/15/17 0800  Gross per 24 hour  Intake           1906.1 ml  Output             2311 ml  Net           -404.9 ml     Physical Exam    General:  Well appearing. No resp difficulty HEENT: normal Neck: supple. JVP 12-14. Carotids 2+ bilat; no bruits. No lymphadenopathy or thyromegaly appreciated. Cor: Mechanical heart sounds with LVAD hum present. Lungs: clear Abdomen: soft, nontender, nondistended. No hepatosplenomegaly. No bruits or masses. Good bowel sounds. Driveline: C/D/I; securement device intact and driveline incorporated Extremities: no cyanosis, clubbing, rash.  1+ edema to knees.  Neuro: alert & orientedx3, cranial nerves grossly intact. moves all 4  extremities w/o difficulty. Affect pleasant  Telemetry   Personally reviewed, no change with afib in 70s  Labs   Basic Metabolic Panel:  Recent Labs Lab 06/09/17 1158 06/13/17 0931 06/13/17 1439 06/14/17 0255 06/15/17 0345  NA 132* 133* 131* 130* 128*  K 4.1 4.3 4.0 3.6 3.8  CL 95* 96* 94* 94* 90*  CO2 23 22 23 22 23   GLUCOSE 199* 162* 132* 159* 220*  BUN 81* 91* 93* 92* 93*  CREATININE 4.79* 5.80* 5.73* 5.79* 6.08*  CALCIUM 8.6* 8.2* 8.2* 8.1* 8.0*  PHOS  --   --   --   --  7.4*    Liver Function Tests:  Recent Labs Lab 06/09/17 1158 06/13/17 0931 06/14/17 0255 06/15/17 0345  AST 28 28 21   --   ALT 23 23 19   --   ALKPHOS 99 102 88  --   BILITOT 1.0 1.1 0.8  --   PROT 7.8 7.2 6.5  --   ALBUMIN 4.0 3.8 3.3* 3.4*   No results for input(s): LIPASE, AMYLASE in the last 168 hours. No results for input(s): AMMONIA in the last 168 hours.  CBC:  Recent Labs Lab 06/09/17 1146 06/13/17 0931 06/13/17 1439 06/14/17 0255 06/15/17 0345  WBC 10.3 9.6 10.7* 9.2 13.3*  HGB 7.9* 7.5* 7.4* 6.5* 8.6*  HCT 23.3* 21.7* 21.6* 18.6* 24.3*  MCV 91.0 90.8 90.4 90.3 87.4  PLT 112* 106* 113* 108* 110*    INR:  Recent Labs Lab 06/09/17 1146 06/11/17 06/13/17 0931 06/14/17 0255 06/15/17 0345  INR 3.22 1.8 2.15 2.30 2.04    Other results:  EKG:    Imaging   Dg Chest 2 View  Result Date: 06/13/2017 CLINICAL DATA:  Acute onset of shortness of breath. Heart failure. Left ventricular assist device placed in February. Initial encounter. EXAM: CHEST  2 VIEW COMPARISON:  Chest radiograph performed 03/27/2017 FINDINGS: The lungs are well-aerated. Mild vascular congestion is noted. There is no evidence of pleural effusion or pneumothorax. The heart is borderline enlarged. The patient is status post median sternotomy. An aortic valve replacement is noted. A left ventricular assist device is seen. An AICD is noted at the right chest wall, with a single lead ending at the right  ventricle. No acute osseous abnormalities are seen. IMPRESSION: Mild vascular congestion and borderline cardiomegaly. Lungs remain relatively clear. Electronically Signed   By: Garald Balding M.D.   On: 06/13/2017 21:40     Medications:     Scheduled Medications: . amLODipine  2.5 mg Oral Daily  . atorvastatin  40 mg Oral Daily  . budesonide  0.25 mg Nebulization BID  . busPIRone  5 mg Oral BID  . darbepoetin (ARANESP) injection - NON-DIALYSIS  150 mcg Subcutaneous Q Fri-1800  . docusate sodium  100 mg Oral BID  . gabapentin  300 mg Oral BID  . levothyroxine  25 mcg Oral QAC breakfast  . metolazone  5 mg Oral Once  . multivitamin with minerals  1 tablet Oral Q1200  . pantoprazole  40 mg Oral Q1200  . sildenafil  40 mg Oral TID  . sodium chloride flush  3 mL Intravenous Q12H  . ascorbic acid  1,000 mg Oral Q1200    Infusions: . sodium chloride    . sodium chloride    . furosemide Stopped (06/14/17 2142)  . milrinone 0.25 mcg/kg/min (06/15/17 0405)    PRN Medications: sodium chloride, acetaminophen, albuterol, ondansetron (ZOFRAN) IV, sodium chloride flush, traMADol, traZODone   Patient Profile   Trevor Kader Murphyis a 71 y.o.malewith a history of CAD s/p CABG x 4 2010, OSA, AS with TAVR 2015, CKD, DM2, paroxysmal atrial fibrillation, asthma, and ischemic cardiomyopathy with Medtronic ICD.   He admitted with marked volume overload and AKI despite with increase in outpatient diuretics.    Assessment/Plan:    1. Acute on chronic systolic CHF: Ischemic cardiomyopathy, EF 20-25% with RV dysfunction on 2/18 echo pre-LVAD. 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 Medtronic ICD. Overall, he has felt much better post-LVAD. However, he developed AKI and was eventually admitted in 5/18 after RHC showed elevated filling pressures. He was diuresed and creatinine came down. Since then, we have adjusted up and down his torsemide dose, with higher dose volume improves but creatinine worsens.  With lower dose we can make creatinine look better but he gets volume overloaded.  He has again developed worsening volume overload with AKI, creatinine up to 5.79.  He had RHC on 8/10 as above and milrinone 0.125 started, increased to 0.25 on 8/11.  Speed increased to 9600 on 8/10.  He has had some UOP but not net negative much and weight unchanged.  He remains volume overloaded on exam, creatinine now 6.08.   - Suspect significant RV  dysfunction is making volume management difficult.  Continue milrinone 0.25, he has tolerated so far without rise in HR.  - Continue Lasix 160 mg IV bid, will add metolazone 5 mg x 1 today.  - No PICC line placed given elevated creatinine.  - Continue warfarin, no ASA with h/o GI bleeding. Dosing per Pharm (aim for 2-2.5).  2. CAD: s/p CABG. Denies CP. Continue atorvastatin.   3. CKD stage IV: Creatinine up to 6.08.  He is diuresing with IV Lasix and milrinone.  He has no signs of uremia at present. Would strongly consider trial of HD if renal function continues to worsen, he has had stable BP throughout his LVAD course.  Discussed yesterday with Dr Lorrene Reid, will see if we can find protocols.     4. Atrial fibrillation: Chronic. On warfarin. Rate ok on milrinone.  5. RV failure: Continue Revatio 40 mg TID.  He has started on milrinone.  6. HTN: MAP stable 70s-80s.  7. Anemia: Baseline anemia of renal disease/chronic disease but recent bleeding from colonic AVMs. No overt bleeding, may be primarily anemia of renal disease at this point. FOBT negative. Got 2 units PRBCs with appropriate rise in Hgb.  8. 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  No overt bleeding.  Hemoglobin low and getting PRBCs, may be related to anemia of renal disease. FOBT negative this admission.  - Continue octreotide injections.  9. Skin cancer: On neck. Needs excision, does not have to stop warfarin. Will address further as outpatient.   I reviewed the LVAD  parameters from today, and compared the results to the patient's prior recorded data.  No programming changes were made.  The LVAD is functioning within specified parameters.  The patient performs LVAD self-test daily.  LVAD interrogation was negative for any significant power changes, alarms or PI events/speed drops.  LVAD equipment check completed and is in good working order.  Back-up equipment present.   LVAD education done on emergency procedures and precautions and reviewed exit site care.  Length of Stay: 2  Loralie Champagne, MD 06/15/2017, 9:02 AM  VAD Team --- VAD ISSUES ONLY--- Pager [phone] (7am - 7am)  Advanced Heart Failure Team  Pager [phone] (M-F; 7a - 4p)  Please contact [facility] for night-coverage after hours (4p -7a ) and weekends on [email or web address]

## 2017-06-15 NOTE — Progress Notes (Signed)
[place] for warfarin>>heparin Indication: LVAD  No Known Allergies  Patient Measurements: Height: 5\' 5"  (165.1 cm) Weight: 197 lb 5 oz (89.5 kg) IBW/kg (Calculated) : 61.5 Heparin Dosing Weight = 80.7 kg  Vital Signs: Temp: 97.6 F (36.4 C) (08/12 1217) Temp Source: Oral (08/12 1217) BP: 114/77 (08/12 1217) Pulse Rate: 76 (08/12 1217)  Labs:  Recent Labs  06/13/17 1439 06/14/17 0255 06/15/17 0345 06/15/17 1201  HGB 7.4* 6.5* 8.6*  --   HCT 21.6* 18.6* 24.3*  --   PLT 113* 108* 110*  --   LABPROT  --  25.7* 23.3* 22.4*  INR  --  2.30 2.04 1.94  CREATININE 5.73* 5.79* 6.08*  --     Estimated Creatinine Clearance: 11.5 mL/min (A) (by C-G formula based on SCr of 6.08 mg/dL (H)).   Medical History: Past Medical History:  Diagnosis Date  . AICD (automatic cardioverter/defibrillator) present   . Asthma   . CHF (congestive heart failure) ([place])   . Diabetes ([place])   . Kidney disease   . Presence of permanent cardiac pacemaker    AICD  . Sleep apnea    Assessment: 71 y.o.malewith a history of CAD s/p CABG x 4 2010, OSA, AS with TAVR 2015, CKD, DM2, paroxysmal atrial fibrillation, asthma, and ischemic cardiomyopathy with Medtronic ICD.   Pt on warfarin PTA now with plans to start heparin once INR < 2 in case of need for procedures. INR on admit 2.15 trended down to 2.04 this morning with a repeat INR further trending down to 1.94. Hgb up s/p 2 units pRBCs, FOBT negative, pltc low.  Anti-coag outpatient visit on 8/8 shows INR of 1.8, dose unchanged at that time, continue 5mg  daily except 7.5mg  on Tuesdays. Recent GIB so he is not on asa.  Goal of Therapy:  INR goal 2-2.5  Heparin Level 0.3-0.5 Monitor platelets by anticoagulation protocol: Yes   Plan:  -Initiate heparin at 1200 units/hr -Check 8-hr heparin level -Monitor heparin level, CBC, S/Sx bleeding daily  Arrie Senate, PharmD PGY-2 Cardiology Pharmacy  Resident Pager: [phone] 06/15/2017

## 2017-06-15 NOTE — Progress Notes (Signed)
[place] for warfarin>>heparin Indication: LVAD  No Known Allergies  Patient Measurements: Height: 5\' 5"  (165.1 cm) Weight: 197 lb 5 oz (89.5 kg) IBW/kg (Calculated) : 61.5 Heparin Dosing Weight = 80.7 kg  Vital Signs: Temp: 97.6 F (36.4 C) (08/12 1948) Temp Source: Oral (08/12 1948) BP: 96/85 (08/12 2100) Pulse Rate: 32 (08/12 2100)  Labs:  Recent Labs  06/13/17 1439 06/14/17 0255 06/15/17 0345 06/15/17 1201 06/15/17 2126  HGB 7.4* 6.5* 8.6*  --   --   HCT 21.6* 18.6* 24.3*  --   --   PLT 113* 108* 110*  --   --   LABPROT  --  25.7* 23.3* 22.4*  --   INR  --  2.30 2.04 1.94  --   HEPARINUNFRC  --   --   --   --  0.26*  CREATININE 5.73* 5.79* 6.08*  --   --     Estimated Creatinine Clearance: 11.5 mL/min (A) (by C-G formula based on SCr of 6.08 mg/dL (H)).   Medical History: Past Medical History:  Diagnosis Date  . AICD (automatic cardioverter/defibrillator) present   . Asthma   . CHF (congestive heart failure) ([place])   . Diabetes ([place])   . Kidney disease   . Presence of permanent cardiac pacemaker    AICD  . Sleep apnea    Assessment: 70 y.o.malewith a history of CAD s/p CABG x 4 2010, OSA, AS with TAVR 2015, CKD, DM2, paroxysmal atrial fibrillation, asthma, and ischemic cardiomyopathy with Medtronic ICD.   Pt on warfarin PTA now with plans to start heparin once INR < 2 in case of need for procedures. INR on admit 2.15 trended down to 2.04 this morning with a repeat INR further trending down to 1.94. Hgb up s/p 2 units pRBCs, FOBT negative, pltc low.  Anti-coag outpatient visit on 8/8 shows INR of 1.8, dose unchanged at that time, continue 5mg  daily except 7.5mg  on Tuesdays. Recent GIB so he is not on asa.  Initial heparin level = 0.26  Goal of Therapy:  INR goal 2-2.5  Heparin Level 0.3-0.5 Monitor platelets by anticoagulation protocol: Yes   Plan:  -Increase heparin to 1300 units / hr -Monitor heparin  level, CBC, S/Sx bleeding daily  Thank you Anette Guarneri, PharmD [phone] 06/15/2017

## 2017-06-16 ENCOUNTER — Encounter (HOSPITAL_COMMUNITY)
Admission: AD | Disposition: A | Discharge status: Home Health | Payer: Self-pay | Source: Ambulatory Visit | Attending: Cardiology

## 2017-06-16 ENCOUNTER — Ambulatory Visit (HOSPITAL_COMMUNITY): Admission: RE | Admit: 2017-06-16 | Payer: Medicare Other | Source: Ambulatory Visit | Admitting: Cardiology

## 2017-06-16 ENCOUNTER — Encounter (HOSPITAL_COMMUNITY)
Admission: RE | Admit: 2017-06-16 | Discharge: 2017-06-16 | Disposition: A | Discharge status: Home / Self Care | Payer: Medicare Other | Source: Ambulatory Visit | Attending: Cardiology | Admitting: Cardiology

## 2017-06-16 DIAGNOSIS — I5022 Chronic systolic (congestive) heart failure: Secondary | ICD-10-CM

## 2017-06-16 DIAGNOSIS — Z95811 Presence of heart assist device: Secondary | ICD-10-CM

## 2017-06-16 LAB — CBC
HEMATOCRIT: 22.1 % — AB (ref 39.0–52.0)
HEMOGLOBIN: 7.9 g/dL — AB (ref 13.0–17.0)
MCH: 31.7 pg (ref 26.0–34.0)
MCHC: 35.7 g/dL (ref 30.0–36.0)
MCV: 88.8 fL (ref 78.0–100.0)
PLATELETS: 98 10*3/uL — AB (ref 150–400)
RBC: 2.49 MIL/uL — ABNORMAL LOW (ref 4.22–5.81)
RDW: 15.1 % (ref 11.5–15.5)
WBC: 10.2 10*3/uL (ref 4.0–10.5)

## 2017-06-16 LAB — RENAL FUNCTION PANEL
Albumin: 3.4 g/dL — ABNORMAL LOW (ref 3.5–5.0)
Anion gap: 16 — ABNORMAL HIGH (ref 5–15)
BUN: 98 mg/dL — AB (ref 6–20)
CALCIUM: 7.8 mg/dL — AB (ref 8.9–10.3)
CO2: 23 mmol/L (ref 22–32)
CREATININE: 6.56 mg/dL — AB (ref 0.61–1.24)
Chloride: 88 mmol/L — ABNORMAL LOW (ref 101–111)
GFR, EST AFRICAN AMERICAN: 9 mL/min — AB (ref >60)
GFR, EST NON AFRICAN AMERICAN: 8 mL/min — AB (ref >60)
Glucose, Bld: 246 mg/dL — ABNORMAL HIGH (ref 65–99)
Phosphorus: 7.8 mg/dL — ABNORMAL HIGH (ref 2.5–4.6)
Potassium: 3.3 mmol/L — ABNORMAL LOW (ref 3.5–5.1)
SODIUM: 127 mmol/L — AB (ref 135–145)

## 2017-06-16 LAB — HEPARIN LEVEL (UNFRACTIONATED): HEPARIN UNFRACTIONATED: 0.32 [IU]/mL (ref 0.30–0.70)

## 2017-06-16 LAB — LACTATE DEHYDROGENASE: LDH: 255 U/L — ABNORMAL HIGH (ref 98–192)

## 2017-06-16 LAB — PROTIME-INR
INR: 1.79
PROTHROMBIN TIME: 21 s — AB (ref 11.4–15.2)

## 2017-06-16 LAB — GLUCOSE, CAPILLARY: Glucose-Capillary: 313 mg/dL — ABNORMAL HIGH (ref 65–99)

## 2017-06-16 SURGERY — RIGHT HEART CATH
Anesthesia: LOCAL

## 2017-06-16 MED ORDER — BACITRACIN-NEOMYCIN-POLYMYXIN OINTMENT TUBE
TOPICAL_OINTMENT | Freq: Every day | CUTANEOUS | Status: DC
  Administered 2017-06-16: 14:00:00 via TOPICAL
  Administered 2017-06-17 – 2017-06-18 (×2): 1 via TOPICAL
  Administered 2017-06-19 – 2017-06-21 (×3): via TOPICAL
  Administered 2017-06-22: 1 via TOPICAL
  Administered 2017-06-23 – 2017-07-06 (×8): via TOPICAL
  Filled 2017-06-16 (×2): qty 14.17

## 2017-06-16 MED ORDER — PREDNISONE 20 MG PO TABS: 40.0000 mg | ORAL_TABLET | Freq: Every day | ORAL | Status: DC

## 2017-06-16 MED ORDER — PREDNISONE 20 MG PO TABS
40.0000 mg | ORAL_TABLET | Freq: Every day | ORAL | Status: AC
  Administered 2017-06-16 – 2017-06-18 (×3): 40 mg via ORAL
  Filled 2017-06-16 (×5): qty 2

## 2017-06-16 MED ORDER — INSULIN ASPART 100 UNIT/ML ~~LOC~~ SOLN
0.0000 [IU] | Freq: Three times a day (TID) | SUBCUTANEOUS | Status: DC
  Administered 2017-06-16: 7 [IU] via SUBCUTANEOUS
  Administered 2017-06-16: 5 [IU] via SUBCUTANEOUS
  Administered 2017-06-17: 3 [IU] via SUBCUTANEOUS

## 2017-06-16 MED ORDER — POTASSIUM CHLORIDE CRYS ER 20 MEQ PO TBCR
20.0000 meq | EXTENDED_RELEASE_TABLET | Freq: Once | ORAL | Status: AC
  Administered 2017-06-16: 20 meq via ORAL
  Filled 2017-06-16: qty 1

## 2017-06-16 MED ORDER — FUROSEMIDE 10 MG/ML IJ SOLN
160.0000 mg | Freq: Three times a day (TID) | INTRAVENOUS | Status: DC
  Administered 2017-06-16 – 2017-06-18 (×7): 160 mg via INTRAVENOUS
  Filled 2017-06-16 (×3): qty 16
  Filled 2017-06-16 (×2): qty 10
  Filled 2017-06-16 (×3): qty 16

## 2017-06-16 MED ORDER — OXYCODONE HCL 5 MG PO TABS
5.0000 mg | ORAL_TABLET | Freq: Three times a day (TID) | ORAL | Status: DC | PRN
  Administered 2017-06-16 – 2017-06-19 (×7): 5 mg via ORAL
  Filled 2017-06-16 (×8): qty 1

## 2017-06-16 MED ORDER — METOLAZONE 5 MG PO TABS
5.0000 mg | ORAL_TABLET | Freq: Once | ORAL | Status: AC
Start: 2017-06-16 — End: 2017-06-16
  Administered 2017-06-16: 5 mg via ORAL
  Filled 2017-06-16: qty 1

## 2017-06-16 NOTE — Care Management Note (Signed)
Case Management Note [name] RN, BSN Unit 4E-Case Manager-- [place] coverage [phone]  Patient Details  Name: Trevor [name] MRN: [id number] Date of Birth: 07/07/1946  Subjective/Objective:  Pt admitted with Acute on chronic systolic CHF -- placed on IV diuresis                  Action/Plan: PTA pt lived at home- followed by HF clinic- CM to follow   Expected Discharge Date:                  Expected Discharge Plan:  [place]  In-House Referral:     Discharge planning Services  CM Consult  Post Acute Care Choice:    Choice offered to:     DME Arranged:    DME Agency:     HH Arranged:    [place] Agency:     Status of Service:  In process, will continue to follow  If discussed at Long Length of Stay Meetings, dates discussed:    Discharge Disposition:   Additional Comments:  [name], RN 06/16/2017, 10:00 AM

## 2017-06-16 NOTE — Progress Notes (Signed)
Patient ID: Trevor [name], male   DOB: 1946/03/07, 71 y.o.   MRN: [id number]   Advanced Heart Failure VAD Team Note  Subjective:    2L of UOP yesterday on lasix 160 mg IV BID.   More with it today. Per nurse was sleepy all day yesterday.  Denies SOB or CP.  Left wrist still with pain and limited ROM. Ankles were painful yesterday, but better today.   Hgb 8.6 -> 7.9. FOBT negative.   LDH 270 =>295 => 255 INR 2 -> 1.79 WBC 13.3 -> 10.2  Creatinine 6.08 -> 6.56  RHC (8/10):  RA = 17 RV = 58/18 PA = 56/25 (37) PCW = 20 (v = 35) Fick cardiac output/index = 6.4/3.2 Thermo CO/CI = 4.1/2.1 PVR = 2.6 Ao sat = 98% PA sat = 56%, 60% Increased LVAD speed to 9600   PCW down to 19  LVAD INTERROGATION:  HeartMate II LVAD:  Flow 7 liters/min, speed 9600, power 7, PI 3.4.  No PI events.   Objective:    Vital Signs:   Temp:  [97.4 F (36.3 C)-97.9 F (36.6 C)] 97.4 F (36.3 C) (08/13 0400) Pulse Rate:  [32-101] 92 (08/13 0600) Resp:  [11-20] 12 (08/13 0600) BP: (73-120)/(51-102) 105/82 (08/13 0600) SpO2:  [80 %-98 %] 97 % (08/13 0600) Weight:  [199 lb 4.7 oz (90.4 kg)] 199 lb 4.7 oz (90.4 kg) (08/13 0400) Last BM Date: 06/15/17 Mean arterial Pressure 70-80s   Intake/Output:   Intake/Output Summary (Last 24 hours) at 06/16/17 0735 Last data filed at 06/16/17 0600  Gross per 24 hour  Intake           994.23 ml  Output             2075 ml  Net         -1080.77 ml     Physical Exam    GENERAL: Well appearing this am. NAD.  HEENT: Normal. NECK: Supple, JVP 16 cm. Carotids OK.  CARDIAC:  Mechanical heart sounds with LVAD hum present.  LUNGS:  CTAB, normal effort.  ABDOMEN:  NT, ND, no HSM. No bruits or masses. +BS  LVAD exit site: Well-healed and incorporated. Dressing dry and intact. No erythema or drainage. Stabilization device present and accurately applied. Driveline dressing changed daily per sterile technique. EXTREMITIES:  Warm and dry. No cyanosis, clubbing, or  rash. Trace to 1+ ankle edema. R lower leg with bandage from skin CA biopsy.  Small amount of serosanguinous drainage. No calor or purulence.  NEUROLOGIC:  Alert & oriented x 3. Cranial nerves grossly intact. Moves all 4 extremities w/o difficulty. Affect pleasant     Telemetry   Personally reviewed, Afib in 70s   Labs   Basic Metabolic Panel:  Recent Labs Lab 06/13/17 0931 06/13/17 1439 06/14/17 0255 06/15/17 0345 06/16/17 0541  NA 133* 131* 130* 128* 127*  K 4.3 4.0 3.6 3.8 3.3*  CL 96* 94* 94* 90* 88*  CO2 22 23 22 23 23   GLUCOSE 162* 132* 159* 220* 246*  BUN 91* 93* 92* 93* 98*  CREATININE 5.80* 5.73* 5.79* 6.08* 6.56*  CALCIUM 8.2* 8.2* 8.1* 8.0* 7.8*  PHOS  --   --   --  7.4* 7.8*    Liver Function Tests:  Recent Labs Lab 06/09/17 1158 06/13/17 0931 06/14/17 0255 06/15/17 0345 06/16/17 0541  AST 28 28 21   --   --   ALT 23 23 19   --   --   ALKPHOS 99  102 88  --   --   BILITOT 1.0 1.1 0.8  --   --   PROT 7.8 7.2 6.5  --   --   ALBUMIN 4.0 3.8 3.3* 3.4* 3.4*   No results for input(s): LIPASE, AMYLASE in the last 168 hours. No results for input(s): AMMONIA in the last 168 hours.  CBC:  Recent Labs Lab 06/13/17 0931 06/13/17 1439 06/14/17 0255 06/15/17 0345 06/16/17 0541  WBC 9.6 10.7* 9.2 13.3* 10.2  HGB 7.5* 7.4* 6.5* 8.6* 7.9*  HCT 21.7* 21.6* 18.6* 24.3* 22.1*  MCV 90.8 90.4 90.3 87.4 88.8  PLT 106* 113* 108* 110* 98*    INR:  Recent Labs Lab 06/13/17 0931 06/14/17 0255 06/15/17 0345 06/15/17 1201 06/16/17 0541  INR 2.15 2.30 2.04 1.94 1.79    Other results:  EKG:    Imaging   No results found.   Medications:     Scheduled Medications: . amLODipine  2.5 mg Oral Daily  . atorvastatin  40 mg Oral Daily  . budesonide  0.25 mg Nebulization BID  . busPIRone  5 mg Oral BID  . darbepoetin (ARANESP) injection - NON-DIALYSIS  150 mcg Subcutaneous Q Fri-1800  . docusate sodium  100 mg Oral BID  . gabapentin  300 mg Oral BID    . levothyroxine  25 mcg Oral QAC breakfast  . multivitamin with minerals  1 tablet Oral Q1200  . pantoprazole  40 mg Oral Q1200  . sevelamer carbonate  800 mg Oral TID WC  . sildenafil  40 mg Oral TID  . sodium chloride flush  3 mL Intravenous Q12H  . ascorbic acid  1,000 mg Oral Q1200    Infusions: . sodium chloride    . sodium chloride    . furosemide 160 mg (06/15/17 1758)  . heparin 1,300 Units/hr (06/15/17 2240)  . milrinone 0.25 mcg/kg/min (06/15/17 2039)    PRN Medications: sodium chloride, acetaminophen, albuterol, ondansetron (ZOFRAN) IV, sodium chloride flush, traMADol, traZODone   Patient Profile   Beryle Zeitz Murphyis a 71 y.o.malewith a history of CAD s/p CABG x 4 2010, OSA, AS with TAVR 2015, CKD, DM2, paroxysmal atrial fibrillation, asthma, and ischemic cardiomyopathy with Medtronic ICD.   He admitted with marked volume overload and AKI despite with increase in outpatient diuretics.    Assessment/Plan:    1. Acute on chronic systolic CHF: Ischemic cardiomyopathy, EF 20-25% with RV dysfunction on 2/18 echo pre-LVAD. 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 Medtronic ICD. Overall, he has felt much better post-LVAD. However, he developed AKI and was eventually admitted in 5/18 after RHC showed elevated filling pressures. He was diuresed and creatinine came down. Since then, we have adjusted up and down his torsemide dose, with higher dose volume improves but creatinine worsens. With lower dose we can make creatinine look better but he gets volume overloaded.  He has again developed worsening volume overload with AKI, creatinine up to 5.79.  He had RHC on 8/10 as above and milrinone 0.125 started, increased to 0.25 on 8/11.  Speed increased to 9600 on 8/10.  He has had some UOP but not net negative much and weight unchanged.  He remains volume overloaded on exam, creatinine now 6.08.   - Suspect significant RV dysfunction is making volume management difficult.  Continue  milrinone 0.25, he has tolerated so far without rise in HR.  - Increase lasix to 160 mg IV TID and will again give metolazone 5 mg po x 1.  - No PICC  line placed given elevated creatinine.  - No ASA with h/o GI bleeding => hold warfarin for now in case procedure needed, cover with heparin gtt while INR subtherapeutic.  2. CAD: s/p CABG. Denies CP. Continue atorvastatin. No change.  3. CKD stage IV: Creatinine up to 6.5. On-going discussions concerning LVAD patients and HD. Will reach out to [facility] and see if they have any protocols available. May need to discuss with [name].  His MAP has been quite stable over time so hopefully that would not limit HD.  - Will discuss further with renal as well.  4. Atrial fibrillation: Chronic. On warfarin.  - Rate stable on milrinone.  5. RV failure: Continue Revatio 40 mg TID.  He has started on milrinone. No change.  6. HTN: MAPs stable in 70-80s   7. Anemia: Baseline anemia of renal disease/chronic disease but recent bleeding from colonic AVMs. No overt bleeding, may be primarily anemia of renal disease at this point. FOBT negative. Got 2 units PRBCs with appropriate rise in Hgb.  - Relatively stable at 7.9 this am. No overt bleeding. Continue to follow closely.  8. 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  No overt bleeding.  Hemoglobin low and getting PRBCs, may be related to anemia of renal disease. FOBT negative this admission.  - Continue octreotide injections. No change.  9. Skin cancer: On neck and RLE. Needs excision, does not have to stop warfarin. Will address further as outpatient. No change.  - RLE site with some bleeding into bandage. Continue to follow with marginal WBC. May need WOC to assess both sites.  10. Possible left wrist gout flare: Will give prednisone 40 mg daily x 3 days.   I reviewed the LVAD parameters from today, and compared the results to the patient's prior recorded data.  No programming changes  were made.  The LVAD is functioning within specified parameters.  The patient performs LVAD self-test daily.  LVAD interrogation was negative for any significant power changes, alarms or PI events/speed drops.  LVAD equipment check completed and is in good working order.  Back-up equipment present.   LVAD education done on emergency procedures and precautions and reviewed exit site care.   Length of Stay: 3  Annamaria Helling 06/16/2017, 7:35 AM  VAD Team --- VAD ISSUES ONLY--- Pager [phone] (7am - 7am)  Advanced Heart Failure Team  Pager [phone] (M-F; 7a - 4p)  Please contact [facility] for night-coverage after hours (4p -7a ) and weekends on [email or web address]  Patient seen with PA, agree with the above note.  He remains very volume overloaded, UOP sluggish on high dose diuretics. Creatinine higher at 6.5. He remains on milrinone for RV support.  - Increase Lasix to tid today and will give a dose of metolazone.  - I am very concerned that we are not going to be able to manage his volume overload medically.  Over the weekend, I discussed the possibility of HD with Dr. Lorrene Reid.  His MAP has been very stable so hopefully could tolerate hemodynamically.  We are reaching out to [facility] for protocols as they have some experience with HD in LVAD patients.   Left right painful and warm.  Possible gout (think this is more than infiltrated IV).  Will give 3 day prednisone burst.   Drainage from right leg biopsy site (skin cancer), will have wound care evaluate.   Loralie Champagne 06/16/2017 9:11 AM

## 2017-06-16 NOTE — Consult Note (Addendum)
[facility] Nurse wound consult note Reason for Consult: Consult requested for left neck and right leg.  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 The dermatologist has told him to apply a band aid and antibiotic ointment to both sites daily. Wound type: Left neck with red raised lesion, .5X.5cm, small amt tan drainage, no odor Right leg with red raised lesion, .8X.8cm, small amt tan drainage, no odor Dressing procedure/placement/frequency: Continue present plan as previously ordered and noted above.  Pt states he will resume the scheduled follow-up after discharge. Please re-consult if further assistance is needed.  Thank-you,  Julien Girt MSN, [place], [place], [place], [place]

## 2017-06-16 NOTE — Progress Notes (Signed)
[place] for warfarin>>heparin Indication: LVAD  No Known Allergies  Patient Measurements: Height: 5\' 5"  (165.1 cm) Weight: 199 lb 4.7 oz (90.4 kg) IBW/kg (Calculated) : 61.5 Heparin Dosing Weight = 80.7 kg  Vital Signs: Temp: 97.4 F (36.3 C) (08/13 0400) Temp Source: Oral (08/12 2332) BP: 105/82 (08/13 0600) Pulse Rate: 92 (08/13 0600)  Labs:  Recent Labs  06/14/17 0255 06/15/17 0345 06/15/17 1201 06/15/17 2126 06/16/17 0541  HGB 6.5* 8.6*  --   --  7.9*  HCT 18.6* 24.3*  --   --  22.1*  PLT 108* 110*  --   --  98*  LABPROT 25.7* 23.3* 22.4*  --  21.0*  INR 2.30 2.04 1.94  --  1.79  HEPARINUNFRC  --   --   --  0.26* 0.32  CREATININE 5.79* 6.08*  --   --  6.56*    Estimated Creatinine Clearance: 10.7 mL/min (A) (by C-G formula based on SCr of 6.56 mg/dL (H)).   Medical History: Past Medical History:  Diagnosis Date  . AICD (automatic cardioverter/defibrillator) present   . Asthma   . CHF (congestive heart failure) ([place])   . Diabetes ([place])   . Kidney disease   . Presence of permanent cardiac pacemaker    AICD  . Sleep apnea    Assessment: 70 y.o.malewith a history of CAD s/p CABG x 4 2010, OSA, AS with TAVR 2015, CKD, DM2, paroxysmal atrial fibrillation, asthma, and ischemic cardiomyopathy with Medtronic ICD.   Pt on warfarin PTA now with plans to start heparin once INR < 2 in case of need for procedures. INR on admit 2.15 trended down to 2.04 this morning with a repeat INR further trending down to 1.94. Hgb up s/p 2 units pRBCs, FOBT negative, pltc low.  Anti-coag outpatient visit on 8/8 shows INR of 1.8, dose unchanged at that time, continue 5mg  daily except 7.5mg  on Tuesdays. Recent GIB so he is not on asa.  INR 1.7 today, hgb 7.9, Heparin level at goal on 1300 units/hr. LDH and plt ok.   Goal of Therapy:  INR goal 2-2.5  Heparin Level 0.3-0.5 Monitor platelets by anticoagulation protocol: Yes   Plan:   -Continue heparin at 1300 units / hr -Monitor heparin level, CBC, S/Sx bleeding daily  Thank you,  Erin Hearing PharmD., BCPS Clinical Pharmacist Pager [phone] 06/16/2017 3:38 PM

## 2017-06-16 NOTE — Progress Notes (Addendum)
Patient ID: Trevor [name], male   DOB: September 28, 1946, 71 y.o.   MRN: [id number] S:Complaining of SOB and some left wrist pain. O:BP (!) 79/67   Pulse 95   Temp (!) 97.4 F (36.3 C)   Resp 10   Ht 5\' 5"  (1.651 m)   Wt 90.4 kg (199 lb 4.7 oz)   SpO2 92%   BMI 33.16 kg/m   Intake/Output Summary (Last 24 hours) at 06/16/17 1044 Last data filed at 06/16/17 1000  Gross per 24 hour  Intake           786.83 ml  Output             2475 ml  Net         -1688.17 ml   Intake/Output: I/O last 3 completed shifts: In: 1280.3 [P.O.:690; I.V.:458.3; IV Piggyback:132] Out: 3311 [Urine:2910; Stool:401]  Intake/Output this shift:  Total I/O In: 39 [I.V.:39] Out: 400 [Urine:400] Weight change: 0.9 kg (1 lb 15.7 oz) FHL:KTGYBWL WM lying in bed in nad SLH:[id number] hum around precordium  Resp:decreased BS at bases Abd:+BS, soft,  NT, LVAD drive line C/D Ext:2 + edema   Recent Labs Lab 06/09/17 1158 06/13/17 0931 06/13/17 1439 06/14/17 0255 06/15/17 0345 06/16/17 0541  NA 132* 133* 131* 130* 128* 127*  K 4.1 4.3 4.0 3.6 3.8 3.3*  CL 95* 96* 94* 94* 90* 88*  CO2 23 22 23 22 23 23   GLUCOSE 199* 162* 132* 159* 220* 246*  BUN 81* 91* 93* 92* 93* 98*  CREATININE 4.79* 5.80* 5.73* 5.79* 6.08* 6.56*  ALBUMIN 4.0 3.8  --  3.3* 3.4* 3.4*  CALCIUM 8.6* 8.2* 8.2* 8.1* 8.0* 7.8*  PHOS  --   --   --   --  7.4* 7.8*  AST 28 28  --  21  --   --   ALT 23 23  --  19  --   --    Liver Function Tests:  Recent Labs Lab 06/09/17 1158 06/13/17 0931 06/14/17 0255 06/15/17 0345 06/16/17 0541  AST 28 28 21   --   --   ALT 23 23 19   --   --   ALKPHOS 99 102 88  --   --   BILITOT 1.0 1.1 0.8  --   --   PROT 7.8 7.2 6.5  --   --   ALBUMIN 4.0 3.8 3.3* 3.4* 3.4*   No results for input(s): LIPASE, AMYLASE in the last 168 hours. No results for input(s): AMMONIA in the last 168 hours. CBC:  Recent Labs Lab 06/13/17 0931 06/13/17 1439 06/14/17 0255 06/15/17 0345 06/16/17 0541  WBC 9.6 10.7*  9.2 13.3* 10.2  HGB 7.5* 7.4* 6.5* 8.6* 7.9*  HCT 21.7* 21.6* 18.6* 24.3* 22.1*  MCV 90.8 90.4 90.3 87.4 88.8  PLT 106* 113* 108* 110* 98*   Cardiac Enzymes: No results for input(s): CKTOTAL, CKMB, CKMBINDEX, TROPONINI in the last 168 hours. CBG: No results for input(s): GLUCAP in the last 168 hours.  Iron Studies:  Recent Labs  06/14/17 1605  IRON 125  TIBC 221*  FERRITIN 622*   Studies/Results: No results found. Marland Kitchen amLODipine  2.5 mg Oral Daily  . atorvastatin  40 mg Oral Daily  . budesonide  0.25 mg Nebulization BID  . busPIRone  5 mg Oral BID  . darbepoetin (ARANESP) injection - NON-DIALYSIS  150 mcg Subcutaneous Q Fri-1800  . docusate sodium  100 mg Oral BID  . gabapentin  300 mg Oral BID  .  insulin aspart  0-9 Units Subcutaneous TID WC  . levothyroxine  25 mcg Oral QAC breakfast  . multivitamin with minerals  1 tablet Oral Q1200  . pantoprazole  40 mg Oral Q1200  . predniSONE  40 mg Oral Q breakfast  . sevelamer carbonate  800 mg Oral TID WC  . sildenafil  40 mg Oral TID  . sodium chloride flush  3 mL Intravenous Q12H  . ascorbic acid  1,000 mg Oral Q1200    BMET    Component Value Date/Time   NA 127 (L) 06/16/2017 0541   K 3.3 (L) 06/16/2017 0541   CL 88 (L) 06/16/2017 0541   CO2 23 06/16/2017 0541   GLUCOSE 246 (H) 06/16/2017 0541   BUN 98 (H) 06/16/2017 0541   CREATININE 6.56 (H) 06/16/2017 0541   CALCIUM 7.8 (L) 06/16/2017 0541   GFRNONAA 8 (L) 06/16/2017 0541   GFRAA 9 (L) 06/16/2017 0541   CBC    Component Value Date/Time   WBC 10.2 06/16/2017 0541   RBC 2.49 (L) 06/16/2017 0541   HGB 7.9 (L) 06/16/2017 0541   HCT 22.1 (L) 06/16/2017 0541   HCT 26.1 (L) 12/08/2016 0334   PLT 98 (L) 06/16/2017 0541   MCV 88.8 06/16/2017 0541   MCH 31.7 06/16/2017 0541   MCHC 35.7 06/16/2017 0541   RDW 15.1 06/16/2017 0541   LYMPHSABS 1.4 03/27/2017 1214   MONOABS 0.7 03/27/2017 1214   EOSABS 0.2 03/27/2017 1214   BASOSABS 0.0 03/27/2017 1214      Assessment/Plan:  1. AKI/CKD stage 4 in setting of decompensated CHF/cardiorenal syndrome.  Discussed case with [name] who is trying to contact [facility] regarding their protocol and policy regarding dialysis in LVAD patients.  The patient wants to try everything as he was quite functional until recently.  He understands that he may not be able to tolerate dialysis but wants to try.  [name] also feels that he should be able to tolerate HD given his cardiac output and BP's.  Will attempt CVVHD for UF after catheter placement either today or tomorrow by IR.  Would recommend AVG placement over AVF as it would take 3 months for AVF to mature compared to 1 month with AVG (if he is able to tolerate IHD and we can arrange for outpatient HD).   2. 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  Suspect significant RV dysfunction.   3. CAD s/p CABG- no chest pain 4. A fib on coumadin 5. RV failure- as above on milrinone 6. HTN: MAPs in the 70's-80's 7. Disposition- difficult situation given the limitations of his cardiac status and presence of LVAD.  He is not doing well with IV diuretics and will attempt trial of CVVHD although he understands that if he does not tolerate this that there is nothing else to be offered.    Donetta Potts, MD [employer] [phone]

## 2017-06-16 NOTE — Progress Notes (Signed)
CARDIAC REHAB PHASE I   PRE:  Rate/Rhythm: 109 afib  BP:  Supine:   Sitting: 79/67  73 MAP  Standing:    SaO2:   MODE:  Ambulation: 120 ft   POST:  Rate/Rhythm: 122  BP:  Supine:   Sitting: 94/78  85 MAP  Standing:    SaO2: 93%RA 1100-1132 Pt walked 120 ft on RA with rolling walker and asst x 1. Back up bag taken with [place]. Heart rate to 122 wide irregular rhythm. Pt c/o ankles better but wrist painful. Put pt back to power source. Pt unable to do himself  due to wrist pain.  Stopped a couple of times to rest during walk.    Graylon Good, RN BSN  06/16/2017 11:29 AM

## 2017-06-16 NOTE — Progress Notes (Signed)
Inpatient Diabetes Program Recommendations  AACE/ADA: New Consensus Statement on Inpatient Glycemic Control (2015)  Target Ranges:  Prepandial:   less than 140 mg/dL      Peak postprandial:   less than 180 mg/dL (1-2 hours)      Critically ill patients:  140 - 180 mg/dL   Results for JAREN, VANETTEN (MRN [id number]) as of 06/16/2017 11:24  Ref. Range 06/13/2017 09:31 06/13/2017 14:39 06/14/2017 02:55 06/15/2017 03:45 06/16/2017 05:41  Glucose Latest Ref Range: 65 - 99 mg/dL 162 (H) 132 (H) 159 (H) 220 (H) 246 (H)   Review of Glycemic Control  Diabetes history: DM2 Outpatient Diabetes medications: Lantus 20 units QHS Current orders for Inpatient glycemic control: Novolog 0-9 units TID with meals  Inpatient Diabetes Program Recommendations: Insulin - Basal: Please consider ordering Lantus 10 units daily. Correction (SSI): Please consider adding Novolog 0-5 units QHS for bedtime correction.  NOTE: Novolog correction scale just ordered this morning. Lab glucose 246 mg/dl today and patient is ordered Prednisone 40 mg QAM which is contributing to hyperglycemia.   Thanks, Barnie Alderman, RN, MSN, CDE Diabetes Coordinator Inpatient Diabetes Program [phone] (Team Pager from 8am to 5pm)

## 2017-06-17 ENCOUNTER — Encounter (HOSPITAL_COMMUNITY): Payer: Self-pay

## 2017-06-17 ENCOUNTER — Ambulatory Visit (HOSPITAL_COMMUNITY): Admission: RE | Admit: 2017-06-17 | Payer: Medicare Other | Source: Ambulatory Visit

## 2017-06-17 ENCOUNTER — Inpatient Hospital Stay (HOSPITAL_COMMUNITY): Payer: Medicare Other

## 2017-06-17 ENCOUNTER — Inpatient Hospital Stay (HOSPITAL_COMMUNITY): Admission: RE | Admit: 2017-06-17 | Payer: Medicare Other | Source: Ambulatory Visit

## 2017-06-17 HISTORY — PX: IR FLUORO GUIDE CV LINE LEFT: IMG2282

## 2017-06-17 HISTORY — PX: IR US GUIDE VASC ACCESS LEFT: IMG2389

## 2017-06-17 LAB — CBC
HEMATOCRIT: 22.6 % — AB (ref 39.0–52.0)
Hemoglobin: 7.9 g/dL — ABNORMAL LOW (ref 13.0–17.0)
MCH: 30 pg (ref 26.0–34.0)
MCHC: 35 g/dL (ref 30.0–36.0)
MCV: 85.9 fL (ref 78.0–100.0)
PLATELETS: 103 10*3/uL — AB (ref 150–400)
RBC: 2.63 MIL/uL — AB (ref 4.22–5.81)
RDW: 14.6 % (ref 11.5–15.5)
WBC: 11 10*3/uL — AB (ref 4.0–10.5)

## 2017-06-17 LAB — RENAL FUNCTION PANEL
ANION GAP: 16 — AB (ref 5–15)
Albumin: 3.4 g/dL — ABNORMAL LOW (ref 3.5–5.0)
Albumin: 3.5 g/dL (ref 3.5–5.0)
Anion gap: 16 — ABNORMAL HIGH (ref 5–15)
BUN: 102 mg/dL — ABNORMAL HIGH (ref 6–20)
BUN: 107 mg/dL — ABNORMAL HIGH (ref 6–20)
CALCIUM: 7.9 mg/dL — AB (ref 8.9–10.3)
CALCIUM: 7.9 mg/dL — AB (ref 8.9–10.3)
CHLORIDE: 86 mmol/L — AB (ref 101–111)
CO2: 23 mmol/L (ref 22–32)
CO2: 23 mmol/L (ref 22–32)
CREATININE: 6.73 mg/dL — AB (ref 0.61–1.24)
Chloride: 85 mmol/L — ABNORMAL LOW (ref 101–111)
Creatinine, Ser: 6.74 mg/dL — ABNORMAL HIGH (ref 0.61–1.24)
GFR calc Af Amer: 9 mL/min — ABNORMAL LOW (ref >60)
GFR calc non Af Amer: 7 mL/min — ABNORMAL LOW (ref >60)
GFR calc non Af Amer: 7 mL/min — ABNORMAL LOW (ref >60)
GFR, EST AFRICAN AMERICAN: 8 mL/min — AB (ref >60)
Glucose, Bld: 221 mg/dL — ABNORMAL HIGH (ref 65–99)
Glucose, Bld: 155 mg/dL — ABNORMAL HIGH (ref 65–99)
POTASSIUM: 3.5 mmol/L (ref 3.5–5.1)
Phosphorus: 7.7 mg/dL — ABNORMAL HIGH (ref 2.5–4.6)
Phosphorus: 8 mg/dL — ABNORMAL HIGH (ref 2.5–4.6)
Potassium: 3.7 mmol/L (ref 3.5–5.1)
SODIUM: 124 mmol/L — AB (ref 135–145)
Sodium: 125 mmol/L — ABNORMAL LOW (ref 135–145)

## 2017-06-17 LAB — GLUCOSE, CAPILLARY
GLUCOSE-CAPILLARY: 236 mg/dL — AB (ref 65–99)
GLUCOSE-CAPILLARY: 243 mg/dL — AB (ref 65–99)
GLUCOSE-CAPILLARY: 255 mg/dL — AB (ref 65–99)
GLUCOSE-CAPILLARY: 267 mg/dL — AB (ref 65–99)
Glucose-Capillary: 191 mg/dL — ABNORMAL HIGH (ref 65–99)
Glucose-Capillary: 161 mg/dL — ABNORMAL HIGH (ref 65–99)
Glucose-Capillary: 285 mg/dL — ABNORMAL HIGH (ref 65–99)

## 2017-06-17 LAB — PROTIME-INR
INR: 1.66
Prothrombin Time: 19.8 seconds — ABNORMAL HIGH (ref 11.4–15.2)

## 2017-06-17 LAB — HEPARIN LEVEL (UNFRACTIONATED): HEPARIN UNFRACTIONATED: 0.39 [IU]/mL (ref 0.30–0.70)

## 2017-06-17 LAB — LACTATE DEHYDROGENASE: LDH: 271 U/L — AB (ref 98–192)

## 2017-06-17 MED ORDER — HEPARIN (PORCINE) 2000 UNITS/L FOR CRRT
INTRAVENOUS_CENTRAL | Status: DC | PRN
  Filled 2017-06-17 (×2): qty 1000

## 2017-06-17 MED ORDER — PRISMASOL BGK 4/2.5 32-4-2.5 MEQ/L IV SOLN
INTRAVENOUS | Status: DC
  Administered 2017-06-17 – 2017-06-20 (×4): via INTRAVENOUS_CENTRAL
  Filled 2017-06-17 (×9): qty 5000

## 2017-06-17 MED ORDER — PRISMASOL BGK 4/2.5 32-4-2.5 MEQ/L IV SOLN
INTRAVENOUS | Status: DC
  Administered 2017-06-17 – 2017-06-20 (×13): via INTRAVENOUS_CENTRAL
  Filled 2017-06-17 (×30): qty 5000

## 2017-06-17 MED ORDER — HEPARIN SODIUM (PORCINE) 1000 UNIT/ML IJ SOLN
INTRAMUSCULAR | Status: AC
  Administered 2017-06-17: 14:00:00
  Filled 2017-06-17: qty 1

## 2017-06-17 MED ORDER — GABAPENTIN 600 MG PO TABS
300.0000 mg | ORAL_TABLET | Freq: Every day | ORAL | Status: DC
  Administered 2017-06-17 – 2017-07-06 (×19): 300 mg via ORAL
  Filled 2017-06-17 (×19): qty 1

## 2017-06-17 MED ORDER — INSULIN ASPART 100 UNIT/ML ~~LOC~~ SOLN
0.0000 [IU] | Freq: Three times a day (TID) | SUBCUTANEOUS | Status: DC
  Administered 2017-06-17 – 2017-06-18 (×4): 3 [IU] via SUBCUTANEOUS
  Administered 2017-06-18: 5 [IU] via SUBCUTANEOUS
  Administered 2017-06-19: 2 [IU] via SUBCUTANEOUS
  Administered 2017-06-20: 8 [IU] via SUBCUTANEOUS
  Administered 2017-06-20: 5 [IU] via SUBCUTANEOUS
  Administered 2017-06-21 – 2017-06-22 (×4): 3 [IU] via SUBCUTANEOUS
  Administered 2017-06-23: 8 [IU] via SUBCUTANEOUS
  Administered 2017-06-24 (×2): 3 [IU] via SUBCUTANEOUS
  Administered 2017-06-25: 5 [IU] via SUBCUTANEOUS
  Administered 2017-06-26 (×2): 2 [IU] via SUBCUTANEOUS
  Administered 2017-06-26 – 2017-06-27 (×3): 3 [IU] via SUBCUTANEOUS
  Administered 2017-06-28: 5 [IU] via SUBCUTANEOUS
  Administered 2017-06-28: 3 [IU] via SUBCUTANEOUS
  Administered 2017-06-28: 2 [IU] via SUBCUTANEOUS
  Administered 2017-06-29: 5 [IU] via SUBCUTANEOUS
  Administered 2017-06-29: 2 [IU] via SUBCUTANEOUS
  Administered 2017-06-30: 5 [IU] via SUBCUTANEOUS
  Administered 2017-07-01 (×2): 3 [IU] via SUBCUTANEOUS
  Administered 2017-07-02 – 2017-07-03 (×2): 2 [IU] via SUBCUTANEOUS
  Administered 2017-07-03 – 2017-07-04 (×2): 5 [IU] via SUBCUTANEOUS
  Administered 2017-07-05 (×2): 3 [IU] via SUBCUTANEOUS
  Administered 2017-07-06: 2 [IU] via SUBCUTANEOUS

## 2017-06-17 MED ORDER — PRISMASOL BGK 4/2.5 32-4-2.5 MEQ/L IV SOLN
INTRAVENOUS | Status: DC
  Administered 2017-06-17 – 2017-06-20 (×9): via INTRAVENOUS_CENTRAL
  Filled 2017-06-17 (×15): qty 5000

## 2017-06-17 MED ORDER — INSULIN ASPART 100 UNIT/ML ~~LOC~~ SOLN
0.0000 [IU] | Freq: Every day | SUBCUTANEOUS | Status: DC
  Administered 2017-06-17: 3 [IU] via SUBCUTANEOUS
  Administered 2017-06-18: 4 [IU] via SUBCUTANEOUS
  Administered 2017-06-19 – 2017-06-21 (×3): 2 [IU] via SUBCUTANEOUS
  Administered 2017-06-24: 3 [IU] via SUBCUTANEOUS
  Administered 2017-06-25 – 2017-07-01 (×5): 2 [IU] via SUBCUTANEOUS

## 2017-06-17 MED ORDER — LIDOCAINE HCL 1 % IJ SOLN
INTRAMUSCULAR | Status: DC | PRN
  Administered 2017-06-17: 10 mL

## 2017-06-17 MED ORDER — HEPARIN (PORCINE) IN NACL 100-0.45 UNIT/ML-% IJ SOLN
1450.0000 [IU]/h | INTRAMUSCULAR | Status: DC
  Administered 2017-06-17 – 2017-06-24 (×10): 1300 [IU]/h via INTRAVENOUS
  Administered 2017-06-25: 1400 [IU]/h via INTRAVENOUS
  Administered 2017-06-26 – 2017-06-29 (×4): 1350 [IU]/h via INTRAVENOUS
  Administered 2017-06-30 (×2): 1450 [IU]/h via INTRAVENOUS
  Filled 2017-06-17 (×17): qty 250

## 2017-06-17 MED ORDER — OCTREOTIDE ACETATE 20 MG IM KIT
20.0000 mg | PACK | Freq: Once | INTRAMUSCULAR | Status: DC
  Filled 2017-06-17: qty 1

## 2017-06-17 MED ORDER — HEPARIN SODIUM (PORCINE) 1000 UNIT/ML DIALYSIS
1000.0000 [IU] | INTRAMUSCULAR | Status: DC | PRN
  Administered 2017-06-20: 2800 [IU] via INTRAVENOUS_CENTRAL
  Filled 2017-06-17 (×2): qty 6

## 2017-06-17 MED ORDER — INSULIN GLARGINE 100 UNIT/ML ~~LOC~~ SOLN
20.0000 [IU] | Freq: Every day | SUBCUTANEOUS | Status: DC
  Administered 2017-06-17 – 2017-06-24 (×8): 20 [IU] via SUBCUTANEOUS
  Filled 2017-06-17 (×8): qty 0.2

## 2017-06-17 MED ORDER — LIDOCAINE HCL (PF) 1 % IJ SOLN
INTRAMUSCULAR | Status: AC
  Administered 2017-06-17: 14:00:00
  Filled 2017-06-17: qty 30

## 2017-06-17 MED FILL — Lidocaine HCl Local Inj 1%: INTRAMUSCULAR | Qty: 40 | Status: AC

## 2017-06-17 NOTE — Progress Notes (Signed)
[place] for warfarin>>heparin Indication: LVAD  Post non-tunneled HD cath. Per IR protocol start anticoagulants back 6 hours post-procedure. Entered orders to start heparin at 8pm tonight. Check labs in am.   Thank you,  Erin Hearing PharmD., BCPS Clinical Pharmacist Pager [phone] 06/17/2017 3:52 PM

## 2017-06-17 NOTE — Progress Notes (Signed)
Patient ID: Trevor [name], male   DOB: [birth year], 71 y.o.   MRN: [id number]   Advanced Heart Failure VAD Team Note  Subjective:    1.8L of UOP yesterday on lasix 160 mg IV TID with 5 mg metolazone. .   Feeling OK this am. Denies SOB or CP. Denies lightheadedness or dizziness. Plan for HD cath today.   Hgb 8.6 -> 7.9 -> 7.9. FOBT negative.   LDH 270 =>295 => 255 => 271 INR 2 -> 1.79 -> 1.66 WBC 13.3 -> 10.2 -> 11.0  Creatinine 6.08 -> 6.56 -> 6.73  RHC (8/10):  RA = 17 RV = 58/18 PA = 56/25 (37) PCW = 20 (v = 35) Fick cardiac output/index = 6.4/3.2 Thermo CO/CI = 4.1/2.1 PVR = 2.6 Ao sat = 98% PA sat = 56%, 60% Increased LVAD speed to 9600   PCW down to 19  LVAD INTERROGATION:  HeartMate II LVAD:  Flow 6.8 liters/min, speed 9600, power 7.0, PI 3.9. No PI events.  Objective:    Vital Signs:   Temp:  [97.2 F (36.2 C)-98.3 F (36.8 C)] 97.3 F (36.3 C) (08/14 0741) Pulse Rate:  [61-137] 61 (08/14 0700) Resp:  [9-24] 11 (08/14 0700) BP: (79-121)/(67-98) 103/81 (08/14 0400) SpO2:  [90 %-97 %] 94 % (08/14 0700) Weight:  [199 lb 11.8 oz (90.6 kg)] 199 lb 11.8 oz (90.6 kg) (08/14 0400) Last BM Date: 06/16/17 Mean arterial Pressure 90  Intake/Output:   Intake/Output Summary (Last 24 hours) at 06/17/17 7425 Last data filed at 06/17/17 0700  Gross per 24 hour  Intake            973.7 ml  Output             1475 ml  Net           -501.3 ml     Physical Exam    GENERAL: Elderly and fatigued appearing this am. NAD.  HEENT: Normal. NECK: Supple, JVP > 14 cm. Carotids OK.  CARDIAC:  Mechanical heart sounds with LVAD hum present.  LUNGS:  Diminished bibasilarly.   ABDOMEN:  NT, ND, no HSM. No bruits or masses. +BS  LVAD exit site: Well-healed and incorporated. Dressing dry and intact. No erythema or drainage. Stabilization device present and accurately applied. Driveline dressing changed daily per sterile technique. EXTREMITIES:  Warm and dry. No cyanosis,  clubbing, rash, or rash. Trace to 1+ ankle edema. RLE wound with dressing.   NEUROLOGIC:  Alert & oriented x 3. Cranial nerves grossly intact. Moves all 4 extremities w/o difficulty. Affect pleasant     Telemetry   Personally reviewed, Afib 70-80s    Labs   Basic Metabolic Panel:  Recent Labs Lab 06/13/17 1439 06/14/17 0255 06/15/17 0345 06/16/17 0541 06/17/17 0253  NA 131* 130* 128* 127* 125*  K 4.0 3.6 3.8 3.3* 3.7  CL 94* 94* 90* 88* 86*  CO2 23 22 23 23 23   GLUCOSE 132* 159* 220* 246* 221*  BUN 93* 92* 93* 98* 102*  CREATININE 5.73* 5.79* 6.08* 6.56* 6.73*  CALCIUM 8.2* 8.1* 8.0* 7.8* 7.9*  PHOS  --   --  7.4* 7.8* 7.7*    Liver Function Tests:  Recent Labs Lab 06/13/17 0931 06/14/17 0255 06/15/17 0345 06/16/17 0541 06/17/17 0253  AST 28 21  --   --   --   ALT 23 19  --   --   --   ALKPHOS 102 88  --   --   --  BILITOT 1.1 0.8  --   --   --   PROT 7.2 6.5  --   --   --   ALBUMIN 3.8 3.3* 3.4* 3.4* 3.4*   No results for input(s): LIPASE, AMYLASE in the last 168 hours. No results for input(s): AMMONIA in the last 168 hours.  CBC:  Recent Labs Lab 06/13/17 1439 06/14/17 0255 06/15/17 0345 06/16/17 0541 06/17/17 0253  WBC 10.7* 9.2 13.3* 10.2 11.0*  HGB 7.4* 6.5* 8.6* 7.9* 7.9*  HCT 21.6* 18.6* 24.3* 22.1* 22.6*  MCV 90.4 90.3 87.4 88.8 85.9  PLT 113* 108* 110* 98* 103*    INR:  Recent Labs Lab 06/14/17 0255 06/15/17 0345 06/15/17 1201 06/16/17 0541 06/17/17 0253  INR 2.30 2.04 1.94 1.79 1.66    Other results:  EKG:    Imaging   No results found.   Medications:     Scheduled Medications: . amLODipine  2.5 mg Oral Daily  . atorvastatin  40 mg Oral Daily  . budesonide  0.25 mg Nebulization BID  . busPIRone  5 mg Oral BID  . darbepoetin (ARANESP) injection - NON-DIALYSIS  150 mcg Subcutaneous Q Fri-1800  . docusate sodium  100 mg Oral BID  . gabapentin  300 mg Oral BID  . insulin aspart  0-9 Units Subcutaneous TID WC  .  levothyroxine  25 mcg Oral QAC breakfast  . multivitamin with minerals  1 tablet Oral Q1200  . neomycin-bacitracin-polymyxin   Topical Daily  . pantoprazole  40 mg Oral Q1200  . predniSONE  40 mg Oral Q breakfast  . sevelamer carbonate  800 mg Oral TID WC  . sildenafil  40 mg Oral TID  . sodium chloride flush  3 mL Intravenous Q12H  . ascorbic acid  1,000 mg Oral Q1200    Infusions: . sodium chloride    . sodium chloride    . furosemide Stopped (06/16/17 2200)  . heparin 1,300 Units/hr (06/17/17 6387)  . milrinone 0.25 mcg/kg/min (06/17/17 0500)    PRN Medications: sodium chloride, acetaminophen, albuterol, ondansetron (ZOFRAN) IV, oxyCODONE, sodium chloride flush, traMADol, traZODone   Patient Profile   Trevor Ayala Murphyis a 71 y.o.malewith a history of CAD s/p CABG x 4 2010, OSA, AS with TAVR 2015, CKD, DM2, paroxysmal atrial fibrillation, asthma, and ischemic cardiomyopathy with Medtronic ICD.   He admitted with marked volume overload and AKI despite with increase in outpatient diuretics.    Assessment/Plan:    1. Acute on chronic systolic CHF: Ischemic cardiomyopathy, EF 20-25% with RV dysfunction on 2/18 echo pre-LVAD. 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 Medtronic ICD. Overall, he has felt much better post-LVAD. However, he developed AKI and was eventually admitted in 5/18 after RHC showed elevated filling pressures. He was diuresed and creatinine came down. Since then, we have adjusted up and down his torsemide dose, with higher dose volume improves but creatinine worsens. With lower dose we can make creatinine look better but he gets volume overloaded.  He has again developed worsening volume overload with AKI, creatinine up to 5.79.  He had RHC on 8/10 as above and milrinone 0.125 started, increased to 0.25 on 8/11.  Speed increased to 9600 on 8/10.  He has had some UOP but not net negative much and weight unchanged.  He remains volume overloaded on exam, creatinine now  6.08.   - Suspect significant RV dysfunction is making volume management difficult.  Continue milrinone 0.25, he has tolerated so far without rise in HR.  - Continue  IV lasix for now. Plan to start CRRT today.  - No PICC line placed given elevated creatinine. HD cath pending.  - No ASA with h/o GI bleeding => hold warfarin for now in case procedure needed, cover with heparin gtt while INR subtherapeutic.  2. CAD: s/p CABG. Denies CP. Continue atorvastatin. No change.   3. CKD stage IV: Creatinine up to 6.7. - Plan for CRRT today.  - On-going discussions for outpatient HD options, to get protocols from [facility].  4. Atrial fibrillation: Chronic. On warfarin.  - Rate stable on milrinone.  5. RV failure: Continue Revatio 40 mg TID.  He has started on milrinone. No change.  6. HTN:  - MAPs stable. Follow closely with initiation of dialysis.    7. Anemia: Baseline anemia of renal disease/chronic disease but recent bleeding from colonic AVMs. No overt bleeding, may be primarily anemia of renal disease at this point. FOBT negative. Got 2 units PRBCs with appropriate rise in Hgb.  - Stable at 7.9 this am. Unchanged from yesterday. No overt bleeding. Continue to follow closely.  8. 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  No overt bleeding.    Hemoglobin low and getting PRBCs, may be related to anemia of renal disease. FOBT negative this admission.  - Continue octreotide injections. No change.  9. Skin cancer: On neck and RLE. Needs excision, does not have to stop warfarin. Will address further as outpatient. No change.  - WOC have assessed biopsy sites  10. Possible left wrist gout flare:  - Day 2/3 prednisone 40 mg daily, better today.  I reviewed the LVAD parameters from today, and compared the results to the patient's prior recorded data.  No programming changes were made.  The LVAD is functioning within specified parameters.  The patient performs LVAD  self-test daily.  LVAD interrogation was negative for any significant power changes, alarms or PI events/speed drops.  LVAD equipment check completed and is in good working order.  Back-up equipment present.   LVAD education done on emergency procedures and precautions and reviewed exit site care.  Length of Stay: [address]  Annamaria Helling 06/17/2017, 8:08 AM  VAD Team --- VAD ISSUES ONLY--- Pager [phone] (7am - 7am)  Advanced Heart Failure Team  Pager [phone] (M-F; 7a - 4p)  Please contact [facility] for night-coverage after hours (4p -7a ) and weekends on [email or web address]  Patient seen with PA, agree with the above note.    Patient remains volume overloaded.  Creatinine rising and inadequate response to high dose diuretics.  MAP remains stable.  - Will place line for CVVH today.  Will attempt CVVH to remove volume.  If RV function improves and renal function picks up with fluid removal, may be able to avoid HD but I am concerned that he will end up needing long-term HD.  We will need to work on protocols.  ?Home HD would be an option.  - Decrease milrinone to 0.125 now, then will stop when CVVH started.    ?Gout flare left wrist, better today with prednisone burst.   Loralie Champagne 06/17/2017 9:42 AM

## 2017-06-17 NOTE — Procedures (Signed)
Pre-procedure Diagnosis: ESRD Post-procedure Diagnosis: Same  Successful placement of non-tunneled HD catheter with tips terminating within the superior aspect of the right atrium.    Complications: None Immediate  EBL: Minimal   The catheter is ready for immediate use.   Jay Eneida Evers, MD Pager #: [phone]   

## 2017-06-17 NOTE — Progress Notes (Signed)
LVAD Coordinator Rounding Note:  Admitted 06/13/17 due to RV failure, marked volume overload.   HM II LVAD implated on 12/13/16 by Dr. Darcey Nora under Destination Therapy criteria.  Vital signs: HR: 81 Doppler Pressure: not done Automatic BP: 107/81 (91) O2 Sat: 99% on RA Wt in lbs: [phone]   LVAD interrogation reveals:   Speed:9600 Flow: 7.2 Power:  7.1w PI: 3.3 Alarms: none Events:  2 PI events Fixed speed: 9600 Low speed limit: 9000  Drive Line: [place] dressing dry and intact; needs weekly dressing changes  Labs:  LDH trend: 317>270>295>255>271  INR trend: 2.15>2.3>2.04>1.94>1.79>1.66  Anticoagulation Plan: -INR Goal:  2.0 - 2.5 -ASA Dose: 81 mg daily - on hold  Device: -Medtronic single lead -Therapies: on   Plan/Recommendations:   1. Dialysis catheter placement today for CVVH. 2. Call VAD pager if any VAD equipment issues at [phone].

## 2017-06-17 NOTE — Progress Notes (Signed)
Patient ID: Trevor [name], male   DOB: 1946-01-20, 71 y.o.   MRN: [id number] S:No complaints O:BP (!) 89/78   Pulse 74   Temp (!) 97.3 F (36.3 C) (Oral)   Resp 15   Ht 5\' 5"  (1.651 m)   Wt 90.6 kg (199 lb 11.8 oz) Comment: With controller  SpO2 94%   BMI 33.24 kg/m   Intake/Output Summary (Last 24 hours) at 06/17/17 1130 Last data filed at 06/17/17 1100  Gross per 24 hour  Intake           1179.1 ml  Output             1925 ml  Net           -745.9 ml   Intake/Output: I/O last 3 completed shifts: In: 1440.9 [P.O.:630; I.V.:743.9; IV Piggyback:67] Out: 2875 [Urine:2875]  Intake/Output this shift:  Total I/O In: 264.5 [P.O.:120; I.V.:78.5; IV Piggyback:66] Out: 450 [Urine:450] Weight change: 0.2 kg (7.1 oz) Gen:NAD VVO:[id number] hum around precordium Resp:decreased BS at bases Abd:+BS Ext:1+ edema   Recent Labs Lab 06/13/17 0931 06/13/17 1439 06/14/17 0255 06/15/17 0345 06/16/17 0541 06/17/17 0253  NA 133* 131* 130* 128* 127* 125*  K 4.3 4.0 3.6 3.8 3.3* 3.7  CL 96* 94* 94* 90* 88* 86*  CO2 22 23 22 23 23 23   GLUCOSE 162* 132* 159* 220* 246* 221*  BUN 91* 93* 92* 93* 98* 102*  CREATININE 5.80* 5.73* 5.79* 6.08* 6.56* 6.73*  ALBUMIN 3.8  --  3.3* 3.4* 3.4* 3.4*  CALCIUM 8.2* 8.2* 8.1* 8.0* 7.8* 7.9*  PHOS  --   --   --  7.4* 7.8* 7.7*  AST 28  --  21  --   --   --   ALT 23  --  19  --   --   --    Liver Function Tests:  Recent Labs Lab 06/13/17 0931 06/14/17 0255 06/15/17 0345 06/16/17 0541 06/17/17 0253  AST 28 21  --   --   --   ALT 23 19  --   --   --   ALKPHOS 102 88  --   --   --   BILITOT 1.1 0.8  --   --   --   PROT 7.2 6.5  --   --   --   ALBUMIN 3.8 3.3* 3.4* 3.4* 3.4*   No results for input(s): LIPASE, AMYLASE in the last 168 hours. No results for input(s): AMMONIA in the last 168 hours. CBC:  Recent Labs Lab 06/13/17 1439 06/14/17 0255 06/15/17 0345 06/16/17 0541 06/17/17 0253  WBC 10.7* 9.2 13.3* 10.2 11.0*  HGB 7.4*  6.5* 8.6* 7.9* 7.9*  HCT 21.6* 18.6* 24.3* 22.1* 22.6*  MCV 90.4 90.3 87.4 88.8 85.9  PLT 113* 108* 110* 98* 103*   Cardiac Enzymes: No results for input(s): CKTOTAL, CKMB, CKMBINDEX, TROPONINI in the last 168 hours. CBG:  Recent Labs Lab 06/16/17 1315 06/16/17 1644 06/16/17 2003 06/16/17 2333 06/17/17 0738  GLUCAP 313* 267* 243* 255* 236*    Iron Studies:  Recent Labs  06/14/17 1605  IRON 125  TIBC 221*  FERRITIN 622*   Studies/Results: No results found. Marland Kitchen amLODipine  2.5 mg Oral Daily  . atorvastatin  40 mg Oral Daily  . budesonide  0.25 mg Nebulization BID  . busPIRone  5 mg Oral BID  . darbepoetin (ARANESP) injection - NON-DIALYSIS  150 mcg Subcutaneous Q Fri-1800  . docusate sodium  100 mg Oral  BID  . gabapentin  300 mg Oral QHS  . insulin aspart  0-15 Units Subcutaneous TID WC  . insulin aspart  0-5 Units Subcutaneous QHS  . insulin glargine  20 Units Subcutaneous QHS  . levothyroxine  25 mcg Oral QAC breakfast  . multivitamin with minerals  1 tablet Oral Q1200  . neomycin-bacitracin-polymyxin   Topical Daily  . pantoprazole  40 mg Oral Q1200  . predniSONE  40 mg Oral Q breakfast  . sevelamer carbonate  800 mg Oral TID WC  . sildenafil  40 mg Oral TID  . sodium chloride flush  3 mL Intravenous Q12H  . ascorbic acid  1,000 mg Oral Q1200    BMET    Component Value Date/Time   NA 125 (L) 06/17/2017 0253   K 3.7 06/17/2017 0253   CL 86 (L) 06/17/2017 0253   CO2 23 06/17/2017 0253   GLUCOSE 221 (H) 06/17/2017 0253   BUN 102 (H) 06/17/2017 0253   CREATININE 6.73 (H) 06/17/2017 0253   CALCIUM 7.9 (L) 06/17/2017 0253   GFRNONAA 7 (L) 06/17/2017 0253   GFRAA 9 (L) 06/17/2017 0253   CBC    Component Value Date/Time   WBC 11.0 (H) 06/17/2017 0253   RBC 2.63 (L) 06/17/2017 0253   HGB 7.9 (L) 06/17/2017 0253   HCT 22.6 (L) 06/17/2017 0253   HCT 26.1 (L) 12/08/2016 0334   PLT 103 (L) 06/17/2017 0253   MCV 85.9 06/17/2017 0253   MCH 30.0 06/17/2017  0253   MCHC 35.0 06/17/2017 0253   RDW 14.6 06/17/2017 0253   LYMPHSABS 1.4 03/27/2017 1214   MONOABS 0.7 03/27/2017 1214   EOSABS 0.2 03/27/2017 1214   BASOSABS 0.0 03/27/2017 1214     Assessment/Plan:  1. AKI/CKD stage 4 in setting of decompensated CHF/cardiorenal syndrome.  Discussed case with [name] who is trying to contact [facility] regarding their protocol and policy regarding dialysis in LVAD patients.  The patient wants to try everything as he was quite functional until recently.  He understands that he may not be able to tolerate dialysis but wants to try.  [name] also feels that he should be able to tolerate HD given his cardiac output and BP's.   1. Will attempt CVVHD for UF after catheter placement today by IR.   2. Would recommend AVG placement over AVF as it would take 3 months for AVF to mature compared to 1 month with AVG (if he is able to tolerate IHD and we can arrange for outpatient HD).   2. 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  Suspect significant RV dysfunction.   3. CAD s/p CABG- no chest pain 4. A fib on coumadin 5. RV failure- as above on milrinone 6. Hypotension: MAPs in the 70's-80's 7. Disposition- difficult situation given the limitations of his cardiac status and presence of LVAD.  He is not doing well with IV diuretics and will attempt trial of CVVHD although he understands that if he does not tolerate this that there is nothing else to be offered.    Donetta Potts, MD [employer] [phone]

## 2017-06-17 NOTE — Progress Notes (Signed)
ANTICOAGULATION CONSULT NOTE  Pharmacy Consult for warfarin>>heparin Indication: LVAD  No Known Allergies  Patient Measurements: Height: 5\' 5"  (165.1 cm) Weight: 199 lb 11.8 oz (90.6 kg) (With controller) IBW/kg (Calculated) : 61.5 Heparin Dosing Weight = 80.7 kg  Vital Signs: Temp: 97.3 F (36.3 C) (08/14 0741) Temp Source: Oral (08/14 0741) BP: 103/81 (08/14 0400) Pulse Rate: 61 (08/14 0700)  Labs:  Recent Labs  06/15/17 0345 06/15/17 1201 06/15/17 2126 06/16/17 0541 06/17/17 0253  HGB 8.6*  --   --  7.9* 7.9*  HCT 24.3*  --   --  22.1* 22.6*  PLT 110*  --   --  98* 103*  LABPROT 23.3* 22.4*  --  21.0* 19.8*  INR 2.04 1.94  --  1.79 1.66  HEPARINUNFRC  --   --  0.26* 0.32 0.39  CREATININE 6.08*  --   --  6.56* 6.73*    Estimated Creatinine Clearance: 10.4 mL/min (A) (by C-G formula based on SCr of 6.73 mg/dL (H)).   Medical History: Past Medical History:  Diagnosis Date  . AICD (automatic cardioverter/defibrillator) present   . Asthma   . CHF (congestive heart failure) ([place])   . Diabetes ([place])   . Kidney disease   . Presence of permanent cardiac pacemaker    AICD  . Sleep apnea    Assessment: 70 y.o.malewith a history of CAD s/p CABG x 4 2010, OSA, AS with TAVR 2015, CKD, DM2, paroxysmal atrial fibrillation, asthma, and ischemic cardiomyopathy with Medtronic ICD.   INR 1.66 today, hgb 7.9, Heparin level continues to be at goal on 1300 units/hr. LDH ok and plt count low stable.   Goal of Therapy:  INR goal 2-2.5  Heparin Level 0.3-0.5 Monitor platelets by anticoagulation protocol: Yes   Plan:  -Continue heparin at 1300 units/hr -Monitor heparin level, CBC, S/Sx bleeding daily  Thank you,  Erin Hearing PharmD., BCPS Clinical Pharmacist Pager [phone] 06/17/2017 8:02 AM

## 2017-06-18 ENCOUNTER — Other Ambulatory Visit (HOSPITAL_COMMUNITY): Payer: Self-pay | Admitting: *Deleted

## 2017-06-18 ENCOUNTER — Encounter (HOSPITAL_COMMUNITY): Payer: Medicare Other

## 2017-06-18 DIAGNOSIS — Z95811 Presence of heart assist device: Secondary | ICD-10-CM | POA: Diagnosis not present

## 2017-06-18 LAB — GLUCOSE, CAPILLARY
Glucose-Capillary: 213 mg/dL — ABNORMAL HIGH (ref 65–99)
Glucose-Capillary: 185 mg/dL — ABNORMAL HIGH (ref 65–99)
Glucose-Capillary: 191 mg/dL — ABNORMAL HIGH (ref 65–99)
Glucose-Capillary: 319 mg/dL — ABNORMAL HIGH (ref 65–99)

## 2017-06-18 LAB — HEPARIN LEVEL (UNFRACTIONATED): Heparin Unfractionated: 0.31 IU/mL (ref 0.30–0.70)

## 2017-06-18 LAB — RENAL FUNCTION PANEL
ANION GAP: 13 (ref 5–15)
Albumin: 3.7 g/dL (ref 3.5–5.0)
Albumin: 3.3 g/dL — ABNORMAL LOW (ref 3.5–5.0)
Anion gap: 10 (ref 5–15)
BUN: 74 mg/dL — ABNORMAL HIGH (ref 6–20)
BUN: 52 mg/dL — ABNORMAL HIGH (ref 6–20)
CALCIUM: 8.1 mg/dL — AB (ref 8.9–10.3)
CALCIUM: 7.7 mg/dL — AB (ref 8.9–10.3)
CO2: 24 mmol/L (ref 22–32)
CO2: 24 mmol/L (ref 22–32)
CREATININE: 3.43 mg/dL — AB (ref 0.61–1.24)
Chloride: 89 mmol/L — ABNORMAL LOW (ref 101–111)
Chloride: 99 mmol/L — ABNORMAL LOW (ref 101–111)
Creatinine, Ser: 4.84 mg/dL — ABNORMAL HIGH (ref 0.61–1.24)
GFR calc non Af Amer: 11 mL/min — ABNORMAL LOW (ref >60)
GFR calc non Af Amer: 17 mL/min — ABNORMAL LOW (ref >60)
GFR, EST AFRICAN AMERICAN: 13 mL/min — AB (ref >60)
GFR, EST AFRICAN AMERICAN: 19 mL/min — AB (ref >60)
Glucose, Bld: 218 mg/dL — ABNORMAL HIGH (ref 65–99)
Glucose, Bld: 216 mg/dL — ABNORMAL HIGH (ref 65–99)
PHOSPHORUS: 5.5 mg/dL — AB (ref 2.5–4.6)
PHOSPHORUS: 3.2 mg/dL (ref 2.5–4.6)
Potassium: 3.6 mmol/L (ref 3.5–5.1)
Potassium: 3.4 mmol/L — ABNORMAL LOW (ref 3.5–5.1)
SODIUM: 126 mmol/L — AB (ref 135–145)
SODIUM: 133 mmol/L — AB (ref 135–145)

## 2017-06-18 LAB — COOXEMETRY PANEL
Carboxyhemoglobin: 1.1 % (ref 0.5–1.5)
Methemoglobin: 1.2 % (ref 0.0–1.5)
O2 Saturation: 62.8 %
Total hemoglobin: 10.1 g/dL — ABNORMAL LOW (ref 12.0–16.0)

## 2017-06-18 LAB — CBC
HCT: 23.7 % — ABNORMAL LOW (ref 39.0–52.0)
Hemoglobin: 8.4 g/dL — ABNORMAL LOW (ref 13.0–17.0)
MCH: 30.9 pg (ref 26.0–34.0)
MCHC: 35.4 g/dL (ref 30.0–36.0)
MCV: 87.1 fL (ref 78.0–100.0)
PLATELETS: 115 10*3/uL — AB (ref 150–400)
RBC: 2.72 MIL/uL — ABNORMAL LOW (ref 4.22–5.81)
RDW: 14.7 % (ref 11.5–15.5)
WBC: 11.3 10*3/uL — AB (ref 4.0–10.5)

## 2017-06-18 LAB — PROTIME-INR
INR: 1.95
Prothrombin Time: 22.6 seconds — ABNORMAL HIGH (ref 11.4–15.2)

## 2017-06-18 LAB — LACTATE DEHYDROGENASE: LDH: 293 U/L — ABNORMAL HIGH (ref 98–192)

## 2017-06-18 LAB — MAGNESIUM: MAGNESIUM: 1.8 mg/dL (ref 1.7–2.4)

## 2017-06-18 MED ORDER — SODIUM CHLORIDE 0.9% FLUSH
10.0000 mL | INTRAVENOUS | Status: DC | PRN
  Administered 2017-06-20: 10 mL
  Filled 2017-06-18: qty 40

## 2017-06-18 MED ORDER — SODIUM CHLORIDE 0.9% FLUSH
10.0000 mL | Freq: Two times a day (BID) | INTRAVENOUS | Status: DC
  Administered 2017-06-18 – 2017-07-06 (×24): 10 mL

## 2017-06-18 MED ORDER — HYDRALAZINE HCL 25 MG PO TABS
25.0000 mg | ORAL_TABLET | Freq: Three times a day (TID) | ORAL | Status: DC
  Administered 2017-06-18 – 2017-06-19 (×3): 25 mg via ORAL
  Filled 2017-06-18 (×3): qty 1

## 2017-06-18 MED ORDER — AMLODIPINE BESYLATE 5 MG PO TABS
5.0000 mg | ORAL_TABLET | Freq: Every day | ORAL | Status: DC
  Administered 2017-06-18 – 2017-06-22 (×5): 5 mg via ORAL
  Filled 2017-06-18 (×5): qty 1

## 2017-06-18 MED ORDER — CHLORHEXIDINE GLUCONATE CLOTH 2 % EX PADS
6.0000 | MEDICATED_PAD | Freq: Every day | CUTANEOUS | Status: DC
  Administered 2017-06-18 – 2017-07-05 (×16): 6 via TOPICAL

## 2017-06-18 NOTE — Progress Notes (Signed)
Patient ID: Trevor [name], male   DOB: 10/12/1946, 71 y.o.   MRN: [id number] S:No complaints. Feels much better on CRRT.   Interval updates: Had non-tunneled HD cath placed yesterday without apparent compl & started on CRRT. 2L urine + 1.6 L out from CRRT. Started ~ 3pm. Pulling well (100-200/hr). BP OK, low normal on milrinone  O:BP (!) 118/104   Pulse 79   Temp 97.6 F (36.4 C) (Oral)   Resp 15   Ht 5\' 5"  (1.651 m)   Wt 196 lb 3.2 oz (89 kg) Comment: With controller  SpO2 97%   BMI 32.65 kg/m     Intake/Output Summary (Last 24 hours) at 06/18/17 0817 Last data filed at 06/18/17 0800  Gross per 24 hour  Intake          1020.25 ml  Output             3706 ml  Net         -2685.75 ml   Intake/Output: I/O last 3 completed shifts: In: 1803.7 [P.O.:1080; I.V.:464.7; Other:10; IV Piggyback:249] Out: 5031 [Urine:3425; Other:1606]  Intake/Output this shift:  Total I/O In: 13 [I.V.:13] Out: 100 [Other:100] Weight change: -3 lb 8.6 oz (-1.604 kg)   Filed Weights   06/16/17 0400 06/17/17 0400 06/18/17 0400  Weight: 199 lb 4.7 oz (90.4 kg) 199 lb 11.8 oz (90.6 kg) 196 lb 3.2 oz (89 kg)   Gen:NAD OMV:[id number] hum around precordium Resp:decreased BS at bases Abd:+BS Ext: Edema improved. HD Access: Temp HD cath placed 8/14   Recent Labs Lab 06/13/17 0931 06/13/17 1439 06/14/17 0255 06/15/17 0345 06/16/17 0541 06/17/17 0253 06/17/17 1502 06/18/17 0406  NA 133* 131* 130* 128* 127* 125* 124* 126*  K 4.3 4.0 3.6 3.8 3.3* 3.7 3.5 3.6  CL 96* 94* 94* 90* 88* 86* 85* 89*  CO2 22 23 22 23 23 23 23 24   GLUCOSE 162* 132* 159* 220* 246* 221* 155* 218*  BUN 91* 93* 92* 93* 98* 102* 107* 74*  CREATININE 5.80* 5.73* 5.79* 6.08* 6.56* 6.73* 6.74* 4.84*  ALBUMIN 3.8  --  3.3* 3.4* 3.4* 3.4* 3.5 3.7  CALCIUM 8.2* 8.2* 8.1* 8.0* 7.8* 7.9* 7.9* 8.1*  PHOS  --   --   --  7.4* 7.8* 7.7* 8.0* 5.5*  AST 28  --  21  --   --   --   --   --   ALT 23  --  19  --   --   --   --   --     Liver Function Tests:  Recent Labs Lab 06/13/17 0931 06/14/17 0255  06/17/17 0253 06/17/17 1502 06/18/17 0406  AST 28 21  --   --   --   --   ALT 23 19  --   --   --   --   ALKPHOS 102 88  --   --   --   --   BILITOT 1.1 0.8  --   --   --   --   PROT 7.2 6.5  --   --   --   --   ALBUMIN 3.8 3.3*  < > 3.4* 3.5 3.7  < > = values in this interval not displayed. No results for input(s): LIPASE, AMYLASE in the last 168 hours. No results for input(s): AMMONIA in the last 168 hours. CBC:  Recent Labs Lab 06/14/17 0255 06/15/17 0345 06/16/17 0541 06/17/17 0253 06/18/17 0406  WBC 9.2 13.3* 10.2  11.0* 11.3*  HGB 6.5* 8.6* 7.9* 7.9* 8.4*  HCT 18.6* 24.3* 22.1* 22.6* 23.7*  MCV 90.3 87.4 88.8 85.9 87.1  PLT 108* 110* 98* 103* 115*   CBG:  Recent Labs Lab 06/16/17 2333 06/17/17 0738 06/17/17 1201 06/17/17 1542 06/17/17 2128  GLUCAP 255* 236* 191* 161* 285*    Iron Studies:  Iron/TIBC/Ferritin/ %Sat    Component Value Date/Time   IRON 125 06/14/2017 1605   TIBC 221 (L) 06/14/2017 1605   FERRITIN 622 (H) 06/14/2017 1605   IRONPCTSAT 57 (H) 06/14/2017 1605   Studies/Results:  Result Date: 06/17/2017  EXAM: NON-TUNNELED CENTRAL VENOUS HEMODIALYSIS CATHETER PLACEMENT WITH ULTRASOUND AND FLUOROSCOPIC GUIDANCE COMPARISON:  IMPRESSION: Successful placement of a left internal jugular approach 20 cm temporary dialysis catheter with tip terminating with in the superior aspect of the right atrium. The catheter is ready for immediate use. PLAN: This catheter may be converted to a tunneled dialysis catheter at a later date as indicated. Electronically Signed   By: Sandi Mariscal M.D.   On: 06/17/2017 15:21   . amLODipine  2.5 mg Oral Daily  . atorvastatin  40 mg Oral Daily  . budesonide  0.25 mg Nebulization BID  . busPIRone  5 mg Oral BID  . darbepoetin (ARANESP) injection - NON-DIALYSIS  150 mcg Subcutaneous Q Fri-1800  . docusate sodium  100 mg Oral BID  . gabapentin  300 mg  Oral QHS  . insulin aspart  0-15 Units Subcutaneous TID WC  . insulin aspart  0-5 Units Subcutaneous QHS  . insulin glargine  20 Units Subcutaneous QHS  . levothyroxine  25 mcg Oral QAC breakfast  . multivitamin with minerals  1 tablet Oral Q1200  . neomycin-bacitracin-polymyxin   Topical Daily  . octreotide  20 mg Intramuscular Once  . pantoprazole  40 mg Oral Q1200  . predniSONE  40 mg Oral Q breakfast  . sevelamer carbonate  800 mg Oral TID WC  . sildenafil  40 mg Oral TID  . sodium chloride flush  3 mL Intravenous Q12H  . ascorbic acid  1,000 mg Oral Q1200   CMP Latest Ref Rng & Units 06/18/2017 06/17/2017 06/17/2017  Glucose 65 - 99 mg/dL 218(H) 155(H) 221(H)  BUN 6 - 20 mg/dL 74(H) 107(H) 102(H)  Creatinine 0.61 - 1.24 mg/dL 4.84(H) 6.74(H) 6.73(H)  Sodium 135 - 145 mmol/L 126(L) 124(L) 125(L)  Potassium 3.5 - 5.1 mmol/L 3.6 3.5 3.7  Chloride 101 - 111 mmol/L 89(L) 85(L) 86(L)  CO2 22 - 32 mmol/L 24 23 23   Calcium 8.9 - 10.3 mg/dL 8.1(L) 7.9(L) 7.9(L)  Total Protein 6.5 - 8.1 g/dL - - -  Total Bilirubin 0.3 - 1.2 mg/dL - - -  Alkaline Phos 38 - 126 U/L - - -  AST 15 - 41 U/L - - -  ALT 17 - 63 U/L - - -   CBC Latest Ref Rng & Units 06/18/2017 06/17/2017 06/16/2017  WBC 4.0 - 10.5 K/uL 11.3(H) 11.0(H) 10.2  Hemoglobin 13.0 - 17.0 g/dL 8.4(L) 7.9(L) 7.9(L)  Hematocrit 39.0 - 52.0 % 23.7(L) 22.6(L) 22.1(L)  Platelets 150 - 400 K/uL 115(L) 103(L) 98(L)   Current CRRT Prescription:  Blood Flow Rate (mL/min) 100 -400 ml/min   Dialysate Flow Rate (mL/kg/hour) 25 mL/kg/hr    UF goal 100-400 ml/hr Heparin 1,000-6,000 units prn  Assessment/Plan:  1. AKI/CKD stage 4 in setting of decompensated CHF/cardiorenal syndrome: Successfully placed catheter + initiated CRRT yesterday afternoon. Pulling well, total of 1.6L out from CRRT.  Making good UOP as well, out 2L urine with lasix 160 mg TID. [name] is trying to contact [facility] regarding their protocol and policy regarding dialysis in  LVAD patients. [name] also feels that he should be able to tolerate HD given his cardiac output and BP's.   1. Continue CRRT 2. Appreciate assistance from HF with obtaining protocol re: LVAD + HD  3. Recommend AVG vs AVF placement as shorter maturation time, IF able to tolerate IHD & outpatient HD can be arranged.  2. 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  Significant RV dysfunction.   1. Per HF 3. CAD s/p CABG- Remains without chest pain 4. Hyperphosphatemia: Started on Renvela 800mg  TID WC 8/12, improved to 5.5 today 5. A fib on Heparin 1. ?TDC and warfarin in future 6. RV failure- as above on milrinone 7. Hypotension: However MAPs in the 70's-80's 8. Disposition- doing OK so far on trial of CRRT. This remains a difficult situation due to his cardiac status, presence of LVAD and poor response with IV diuretics. He understands that if he does not tolerate this that there is nothing else to be offered.    Einar Gip, DO PGY-2 [facility] [phone]  I have seen and examined this patient and agree with plan and assessment in the above note with renal recommendations/intervention highlighted.  Had temp cath placed yesterday to expedite initiation of CVVHD (INR was too high for tunneled HD cath yesterday and we were not sure he would tolerate CVVHD).  Off lasix and still with brisk UOP.  Feels much better after starting CVVHD and tolerating it well.  Continue with CVVHD for now and if no improvement of renal function, we may need to attempt IHD later this week. Governor Rooks Reshma Hoey,MD 06/18/2017 12:28 PM

## 2017-06-18 NOTE — Progress Notes (Signed)
ANTICOAGULATION CONSULT NOTE  Pharmacy Consult for heparin Indication: LVAD  No Known Allergies  Patient Measurements: Height: 5\' 5"  (165.1 cm) Weight: 196 lb 3.2 oz (89 kg) (With controller) IBW/kg (Calculated) : 61.5 Heparin Dosing Weight = 80.7 kg  Vital Signs: Temp: 97.6 F (36.4 C) (08/15 0800) Temp Source: Oral (08/15 0800) BP: 115/100 (08/15 1100) Pulse Rate: 89 (08/15 1100)  Labs:  Recent Labs  06/16/17 0541 06/17/17 0253 06/17/17 1502 06/18/17 0406  HGB 7.9* 7.9*  --  8.4*  HCT 22.1* 22.6*  --  23.7*  PLT 98* 103*  --  115*  LABPROT 21.0* 19.8*  --  22.6*  INR 1.79 1.66  --  1.95  HEPARINUNFRC 0.32 0.39  --  0.31  CREATININE 6.56* 6.73* 6.74* 4.84*    Estimated Creatinine Clearance: 14.4 mL/min (A) (by C-G formula based on SCr of 4.84 mg/dL (H)).   Medical History: Past Medical History:  Diagnosis Date  . AICD (automatic cardioverter/defibrillator) present   . Asthma   . CHF (congestive heart failure) ([place])   . Diabetes ([place])   . Kidney disease   . Presence of permanent cardiac pacemaker    AICD  . Sleep apnea    Assessment: 71 y.o.malewith a history of CAD s/p CABG x 4 2010, OSA, AS with TAVR 2015, CKD, DM2, paroxysmal atrial fibrillation, asthma, and ischemic cardiomyopathy with Medtronic ICD.   INR 1.9 up today, hgb 8.4, heparin level continues to be at goal on 1300 units/hr. LDH ok and plt count low stable.   Goal of Therapy:  INR goal 2-2.5  Heparin Level 0.3-0.5 Monitor platelets by anticoagulation protocol: Yes   Plan:  -Continue heparin at 1300 units/hr -Monitor heparin level, CBC, S/Sx bleeding daily  Thank you,  Erin Hearing PharmD., BCPS Clinical Pharmacist Pager [phone] 06/18/2017 11:46 AM

## 2017-06-18 NOTE — Progress Notes (Signed)
Cardiac Individual Treatment Plan  Patient Details  Name: Trevor [name] MRN: [id number] Date of Birth: 11-26-1945 Referring Provider:     CARDIAC REHAB PHASE II ORIENTATION from 04/03/2017 in [place]  Referring Provider  Loralie Champagne MD      Initial Encounter Date:    CARDIAC REHAB PHASE II ORIENTATION from 04/03/2017 in [place]  Date  04/03/17  Referring Provider  Loralie Champagne MD      Visit Diagnosis: 12/13/16 LVAD (left ventricular assist device) present [facility])  Heart failure, chronic systolic ([place])  Patient's Home Medications on Admission: No current facility-administered medications for this encounter.  No current outpatient prescriptions on file.  Facility-Administered Medications Ordered in Other Encounters:  .  0.9 %  sodium chloride infusion, , Intravenous, Once, Larey Dresser, MD .  0.9 %  sodium chloride infusion, 250 mL, Intravenous, PRN, Bensimhon, Shaune Pascal, MD .  0.9 %  sodium chloride infusion, , Intravenous, Once, Bensimhon, Shaune Pascal, MD .  acetaminophen (TYLENOL) tablet 650 mg, 650 mg, Oral, Q4H PRN, Bensimhon, Shaune Pascal, MD, 650 mg at 06/17/17 1216 .  albuterol (PROVENTIL) (2.5 MG/3ML) 0.083% nebulizer solution 3 mL, 3 mL, Inhalation, Q4H PRN, Shirley Friar, PA-C, 3 mL at 06/16/17 0111 .  amLODipine (NORVASC) tablet 5 mg, 5 mg, Oral, Daily, Shirley Friar, PA-C, 5 mg at 06/18/17 7829 .  atorvastatin (LIPITOR) tablet 40 mg, 40 mg, Oral, Daily, Shirley Friar, PA-C, 40 mg at 06/18/17 5621 .  budesonide (PULMICORT) nebulizer solution 0.25 mg, 0.25 mg, Nebulization, BID, Shirley Friar, PA-C, 0.25 mg at 06/18/17 1122 .  busPIRone (BUSPAR) tablet 5 mg, 5 mg, Oral, BID, Shirley Friar, PA-C, 5 mg at 06/18/17 3086 .  Chlorhexidine Gluconate Cloth 2 % PADS 6 each, 6 each, Topical, Daily, Larey Dresser, MD, 6 each at 06/18/17 1000 .  Darbepoetin Alfa  (ARANESP) injection 150 mcg, 150 mcg, Subcutaneous, Q Fri-1800, Jamal Maes, MD, 150 mcg at 06/13/17 2158 .  docusate sodium (COLACE) capsule 100 mg, 100 mg, Oral, BID, Shirley Friar, PA-C, 100 mg at 06/18/17 5784 .  gabapentin (NEURONTIN) tablet 300 mg, 300 mg, Oral, QHS, Shirley Friar, PA-C, 300 mg at 06/17/17 2150 .  heparin ADULT infusion 100 units/mL (25000 units/243mL sodium chloride 0.45%), 1,300 Units/hr, Intravenous, Continuous, Lyndee Leo, [facility], Last Rate: 13 mL/hr at 06/18/17 1018, 1,300 Units/hr at 06/18/17 1018 .  heparin injection 1,000-6,000 Units, 1,000-6,000 Units, CRRT, PRN, Donato Heinz, MD .  heparinized saline (2000 units/L) primer fluid for CRRT, , CRRT, PRN, Donato Heinz, MD .  hydrALAZINE (APRESOLINE) tablet 25 mg, 25 mg, Oral, Q8H, Shirley Friar, PA-C, 25 mg at 06/18/17 1600 .  insulin aspart (novoLOG) injection 0-15 Units, 0-15 Units, Subcutaneous, TID WC, Shirley Friar, PA-C, 3 Units at 06/18/17 1601 .  insulin aspart (novoLOG) injection 0-5 Units, 0-5 Units, Subcutaneous, QHS, Shirley Friar, PA-C, 3 Units at 06/17/17 2150 .  insulin glargine (LANTUS) injection 20 Units, 20 Units, Subcutaneous, QHS, Shirley Friar, PA-C, 20 Units at 06/17/17 2150 .  levothyroxine (SYNTHROID, LEVOTHROID) tablet 25 mcg, 25 mcg, Oral, QAC breakfast, Shirley Friar, PA-C, 25 mcg at 06/18/17 0801 .  multivitamin with minerals tablet 1 tablet, 1 tablet, Oral, Q1200, Shirley Friar, PA-C, 1 tablet at 06/18/17 1115 .  neomycin-bacitracin-polymyxin (NEOSPORIN) ointment, , Topical, Daily, Larey Dresser, MD, 1 application at 69/62/95 [phone] .  ondansetron (ZOFRAN) injection 4  mg, 4 mg, Intravenous, Q6H PRN, Bensimhon, Shaune Pascal, MD .  oxyCODONE (Oxy IR/ROXICODONE) immediate release tablet 5 mg, 5 mg, Oral, Q8H PRN, Larey Dresser, MD, 5 mg at 06/17/17 1703 .  pantoprazole (PROTONIX) EC tablet 40 mg, 40  mg, Oral, Q1200, Shirley Friar, PA-C, 40 mg at 06/18/17 1115 .  prismasol BGK 4/2.5 5,000 mL dialysis replacement fluid, , CRRT, Continuous, Coladonato, Joseph, MD, Last Rate: 500 mL/hr at 06/18/17 1308 .  prismasol BGK 4/2.5 5,000 mL dialysis replacement fluid, , CRRT, Continuous, Coladonato, Joseph, MD, Last Rate: 300 mL/hr at 06/18/17 1014 .  prismasol BGK 4/2.5 5,000 mL dialysis solution, , CRRT, Continuous, Coladonato, Joseph, MD, Last Rate: 1,000 mL/hr at 06/18/17 1302 .  sevelamer carbonate (RENVELA) tablet 800 mg, 800 mg, Oral, TID WC, Jamal Maes, MD, 800 mg at 06/18/17 1710 .  sildenafil (REVATIO) tablet 40 mg, 40 mg, Oral, TID, Shirley Friar, PA-C, 40 mg at 06/18/17 1600 .  sodium chloride flush (NS) 0.9 % injection 10-40 mL, 10-40 mL, Intracatheter, Q12H, Larey Dresser, MD, 10 mL at 06/18/17 1000 .  sodium chloride flush (NS) 0.9 % injection 10-40 mL, 10-40 mL, Intracatheter, PRN, Larey Dresser, MD .  sodium chloride flush (NS) 0.9 % injection 3 mL, 3 mL, Intravenous, Q12H, Bensimhon, Shaune Pascal, MD, 3 mL at 06/18/17 0917 .  sodium chloride flush (NS) 0.9 % injection 3 mL, 3 mL, Intravenous, PRN, Bensimhon, Shaune Pascal, MD .  traMADol Veatrice Bourbon) tablet 50 mg, 50 mg, Oral, Q6H PRN, Larey Dresser, MD, 50 mg at 06/16/17 0445 .  traZODone (DESYREL) tablet 100 mg, 100 mg, Oral, QHS PRN, Larey Dresser, MD, 100 mg at 06/17/17 2034 .  vitamin C (ASCORBIC ACID) tablet 1,000 mg, 1,000 mg, Oral, Q1200, Shirley Friar, PA-C, 1,000 mg at 06/18/17 1115  Past Medical History: Past Medical History:  Diagnosis Date  . AICD (automatic cardioverter/defibrillator) present   . Asthma   . CHF (congestive heart failure) ([place])   . Diabetes ([place])   . Kidney disease   . Presence of permanent cardiac pacemaker    AICD  . Sleep apnea     Tobacco Use: History  Smoking Status  . Never Smoker  Smokeless Tobacco  . Never Used    Labs: Recent Review Flowsheet Data     Labs for ITP Cardiac and Pulmonary Rehab Latest Ref Rng & Units 03/27/2017 03/27/2017 06/13/2017 06/13/2017 06/18/2017   Cholestrol 0 - 200 mg/dL - - - - -   LDLCALC 0 - 99 mg/dL - - - - -   HDL >40 mg/dL - - - - -   Trlycerides <150 mg/dL - - - - -   Hemoglobin A1c 4.8 - 5.6 % - - - - -   PHART 7.350 - 7.450 - - - - -   PCO2ART 32.0 - 48.0 mmHg - - - - -   HCO3 20.0 - 28.0 mmol/L 34.4(H) 35.2(H) 22.7 22.8 -   TCO2 0 - 100 mmol/L 36 37 24 24 -   ACIDBASEDEF 0.0 - 2.0 mmol/L - - 3.0(H) 3.0(H) -   O2SAT % 53.0 53.0 60.0 56.0 62.8      Capillary Blood Glucose: Lab Results  Component Value Date   GLUCAP 191 (H) 06/18/2017   GLUCAP 185 (H) 06/18/2017   GLUCAP 213 (H) 06/18/2017   GLUCAP 285 (H) 06/17/2017   GLUCAP 161 (H) 06/17/2017     Exercise Target Goals:    Exercise Program  Goal: Individual exercise prescription set with THRR, safety & activity barriers. Participant demonstrates ability to understand and report RPE using BORG scale, to self-measure pulse accurately, and to acknowledge the importance of the exercise prescription.  Exercise Prescription Goal: Starting with aerobic activity 30 plus minutes a day, 3 days per week for initial exercise prescription. Provide home exercise prescription and guidelines that participant acknowledges understanding prior to discharge.  Activity Barriers & Risk Stratification:     Activity Barriers & Cardiac Risk Stratification - 04/03/17 0832      Activity Barriers & Cardiac Risk Stratification   Activity Barriers Arthritis;Deconditioning;Muscular Weakness;Shortness of Breath   Cardiac Risk Stratification High      6 Minute Walk:     6 Minute Walk    Row Name 04/03/17 1611 04/03/17 1618       6 Minute Walk   Phase Initial  -    Distance 830 feet  -    Walk Time 4.55 minutes  -    # of Rest Breaks 0 2    MPH 2.1  -    METS 1.7  -    RPE 13  -    VO2 Peak 5.9  -    Symptoms No  -    Resting HR 70 bpm  -    Resting BP  78/0  -    Max Ex. HR 125 bpm  -    Max Ex. BP 96/0  -    2 Minute Post BP 84/0  -       Oxygen Initial Assessment:   Oxygen Re-Evaluation:   Oxygen Discharge (Final Oxygen Re-Evaluation):   Initial Exercise Prescription:     Initial Exercise Prescription - 04/03/17 1100      Date of Initial Exercise RX and Referring Provider   Date 04/03/17   Referring Provider Loralie Champagne MD     NuStep   Level 2   Minutes 10   METs 2     Arm Ergometer   Level 1   Watts 15   Minutes 10   METs 2     Track   Laps 8   Minutes 10   METs 2.4     Prescription Details   Frequency (times per week) 3   Duration Progress to 45 minutes of aerobic exercise without signs/symptoms of physical distress     Intensity   THRR 40-80% of Max Heartrate 60-119   Ratings of Perceived Exertion 11-13   Perceived Dyspnea 0-4     Progression   Progression Continue to progress workloads to maintain intensity without signs/symptoms of physical distress.     Resistance Training   Training Prescription Yes   Weight 2   Reps 10-15      Perform Capillary Blood Glucose checks as needed.  Exercise Prescription Changes:     Exercise Prescription Changes    Row Name 04/23/17 1200 05/15/17 1600 06/13/17 1400         Response to Exercise   Blood Pressure (Admit) 95/68 101/76 110/60     Blood Pressure (Exercise) 107/74 110/77 114/62     Blood Pressure (Exit) 98/74 98/74 125/78     Heart Rate (Admit) 76 bpm 83 bpm 79 bpm     Heart Rate (Exercise) 90 bpm 102 bpm 93 bpm     Heart Rate (Exit) 74 bpm 81 bpm 69 bpm     Rating of Perceived Exertion (Exercise) 11 13 12      Duration Progress to 45  minutes of aerobic exercise without signs/symptoms of physical distress Progress to 45 minutes of aerobic exercise without signs/symptoms of physical distress Progress to 45 minutes of aerobic exercise without signs/symptoms of physical distress     Intensity THRR unchanged THRR unchanged THRR unchanged        Progression   Progression Continue to progress workloads to maintain intensity without signs/symptoms of physical distress. Continue to progress workloads to maintain intensity without signs/symptoms of physical distress. Continue to progress workloads to maintain intensity without signs/symptoms of physical distress.     Average METs 2.3 2.5 2.6       Resistance Training   Training Prescription Yes Yes Yes     Weight 2 4lb 5lb     Reps 10-15 10-15 10-15       NuStep   Level 2 2 4      Minutes 10 10 10      METs 2 2.5 2.8       Arm Ergometer   Level 1 1 1      Watts 15 15 15      Minutes 10 10 10      METs 2.62 2.62 2.62       Track   Laps 8 8 8      Minutes 10 10 10      METs 2.4 2.4 2.4       Home Exercise Plan   Plans to continue exercise at Home (comment) Home (comment) Home (comment)     Frequency Add 3 additional days to program exercise sessions. Add 3 additional days to program exercise sessions. Add 3 additional days to program exercise sessions.        Exercise Comments:     Exercise Comments    Row Name 04/23/17 1159 05/15/17 1612 06/13/17 1410       Exercise Comments Revivewed METs and goals with pt.  reviewed goals with pt. reviewed goals with pt.        Exercise Goals and Review:     Exercise Goals    Row Name 04/03/17 [phone]             Exercise Goals   Increase Physical Activity Yes       Intervention Provide advice, education, support and counseling about physical activity/exercise needs.;Develop an individualized exercise prescription for aerobic and resistive training based on initial evaluation findings, risk stratification, comorbidities and participant's personal goals.       Expected Outcomes Achievement of increased cardiorespiratory fitness and enhanced flexibility, muscular endurance and strength shown through measurements of functional capacity and personal statement of participant.       Increase Strength and Stamina Yes  be able to  climb stairs and uphill without SOB       Intervention Provide advice, education, support and counseling about physical activity/exercise needs.;Develop an individualized exercise prescription for aerobic and resistive training based on initial evaluation findings, risk stratification, comorbidities and participant's personal goals.       Expected Outcomes Achievement of increased cardiorespiratory fitness and enhanced flexibility, muscular endurance and strength shown through measurements of functional capacity and personal statement of participant.          Exercise Goals Re-Evaluation :     Exercise Goals Re-Evaluation    Row Name 04/23/17 1158 05/15/17 1608 06/13/17 1410         Exercise Goal Re-Evaluation   Exercise Goals Review Increase Physical Activity;Increase Strenth and Stamina Increase Physical Activity;Increase Strenth and Stamina Increase Physical Activity;Increase Strenth and Stamina  Comments Pt is doing well with exercise and states that he feels pretty good overall Pt is doing well with exercise in CR.  He states that he feels as though he is doing fair and thinks he can push himself more, but overall he feels much better than he did prior to beginning an exercise program. Pt continues to do well with exericse and he is walking and doing stretching at home.      Expected Outcomes Continue with exercise Rx and increase workloads as tolerated in order to increase exercise tolerance Continue with exercise Rx and increase workloads as tolerated in order to increase exercise tolerance Continue with exercise Rx and increase workloads as tolerated in order to increase exercise tolerance         Discharge Exercise Prescription (Final Exercise Prescription Changes):     Exercise Prescription Changes - 06/13/17 1400      Response to Exercise   Blood Pressure (Admit) 110/60   Blood Pressure (Exercise) 114/62   Blood Pressure (Exit) 125/78   Heart Rate (Admit) 79 bpm   Heart  Rate (Exercise) 93 bpm   Heart Rate (Exit) 69 bpm   Rating of Perceived Exertion (Exercise) 12   Duration Progress to 45 minutes of aerobic exercise without signs/symptoms of physical distress   Intensity THRR unchanged     Progression   Progression Continue to progress workloads to maintain intensity without signs/symptoms of physical distress.   Average METs 2.6     Resistance Training   Training Prescription Yes   Weight 5lb   Reps 10-15     NuStep   Level 4   Minutes 10   METs 2.8     Arm Ergometer   Level 1   Watts 15   Minutes 10   METs 2.62     Track   Laps 8   Minutes 10   METs 2.4     Home Exercise Plan   Plans to continue exercise at Home (comment)   Frequency Add 3 additional days to program exercise sessions.      Nutrition:  Target Goals: Understanding of nutrition guidelines, daily intake of sodium 1500mg , cholesterol 200mg , calories 30% from fat and 7% or less from saturated fats, daily to have 5 or more servings of fruits and vegetables.  Biometrics:     Pre Biometrics - 04/03/17 1608      Pre Biometrics   Height 5' 3.5" (1.613 m)   Weight 179 lb 14.3 oz (81.6 kg)   Waist Circumference 40.75 inches   Hip Circumference 41.5 inches   Waist to Hip Ratio 0.98 %   BMI (Calculated) 31.4   Triceps Skinfold 20 mm   % Body Fat 31.2 %   Grip Strength 33.5 kg   Flexibility 13 in   Single Leg Stand 2.2 seconds       Nutrition Therapy Plan and Nutrition Goals:     Nutrition Therapy & Goals - 05/02/17 1116      Nutrition Therapy   Diet Carb Modified, Therapeutic Lifestyle Changes     Personal Nutrition Goals   Nutrition Goal Wt loss of 1-2 lb/week to a wt loss goal of 6-24 lb at graduation from Cardiac Rehab.      Intervention Plan   Intervention Prescribe, educate and counsel regarding individualized specific dietary modifications aiming towards targeted core components such as weight, hypertension, lipid management, diabetes, heart  failure and other comorbidities.   Expected Outcomes Short Term Goal: Understand basic principles of  dietary content, such as calories, fat, sodium, cholesterol and nutrients.;Long Term Goal: Adherence to prescribed nutrition plan.      Nutrition Discharge: Nutrition Scores:     Nutrition Assessments - 05/02/17 1116      MEDFICTS Scores   Pre Score 58      Nutrition Goals Re-Evaluation:   Nutrition Goals Re-Evaluation:   Nutrition Goals Discharge (Final Nutrition Goals Re-Evaluation):   Psychosocial: Target Goals: Acknowledge presence or absence of significant depression and/or stress, maximize coping skills, provide positive support system. Participant is able to verbalize types and ability to use techniques and skills needed for reducing stress and depression.  Initial Review & Psychosocial Screening:     Initial Psych Review & Screening - 04/03/17 1605      Initial Review   Current issues with None Identified     Family Dynamics   Good Support System? Yes     Barriers   Psychosocial barriers to participate in program The patient should benefit from training in stress management and relaxation.;There are no identifiable barriers or psychosocial needs.     Screening Interventions   Interventions Encouraged to exercise      Quality of Life Scores:     Quality of Life - 04/03/17 1145      Quality of Life Scores   Health/Function Pre 19 %   Socioeconomic Pre 23.17 %   Psych/Spiritual Pre 16.29 %   Family Pre 20.83 %   GLOBAL Pre 19.37 %      PHQ-9: Recent Review Flowsheet Data    Depression screen [facility] 2/9 04/07/2017 01/17/2017   Decreased Interest 0 0   Down, Depressed, Hopeless 0 0   PHQ - 2 Score 0 0     Interpretation of Total Score  Total Score Depression Severity:  1-4 = Minimal depression, 5-9 = Mild depression, 10-14 = Moderate depression, 15-19 = Moderately severe depression, 20-27 = Severe depression   Psychosocial Evaluation and  Intervention:   Psychosocial Re-Evaluation:     Psychosocial Re-Evaluation    [place] Name 04/22/17 1103 05/19/17 0941 06/18/17 1851         Psychosocial Re-Evaluation   Current issues with Current Stress Concerns Current Stress Concerns Current Stress Concerns     Interventions Stress management education;Encouraged to attend Cardiac Rehabilitation for the exercise Stress management education;Encouraged to attend Cardiac Rehabilitation for the exercise Encouraged to attend Cardiac Rehabilitation for the exercise;Stress management education     Continue Psychosocial Services  No Follow up required No Follow up required No Follow up required       Initial Review   Source of Stress Concerns Chronic Illness Chronic Illness Chronic Illness        Psychosocial Discharge (Final Psychosocial Re-Evaluation):     Psychosocial Re-Evaluation - 06/18/17 1851      Psychosocial Re-Evaluation   Current issues with Current Stress Concerns   Interventions Encouraged to attend Cardiac Rehabilitation for the exercise;Stress management education   Continue Psychosocial Services  No Follow up required     Initial Review   Source of Stress Concerns Chronic Illness      Vocational Rehabilitation: Provide vocational rehab assistance to qualifying candidates.   Vocational Rehab Evaluation & Intervention:     Vocational Rehab - 04/03/17 1602      Initial Vocational Rehab Evaluation & Intervention   Assessment shows need for Vocational Rehabilitation No  Mr Utsey is retired and does not need vocational rehab at this time.      Education: Education  Goals: Education classes will be provided on a weekly basis, covering required topics. Participant will state understanding/return demonstration of topics presented.  Learning Barriers/Preferences:     Learning Barriers/Preferences - 04/03/17 2778      Learning Barriers/Preferences   Learning Barriers Sight   Learning Preferences Skilled  Demonstration;Verbal Instruction;Video;Written Material      Education Topics: Count Your Pulse:  -Group instruction provided by verbal instruction, demonstration, patient participation and written materials to support subject.  Instructors address importance of being able to find your pulse and how to count your pulse when at home without a heart monitor.  Patients get hands on experience counting their pulse with staff help and individually.   Heart Attack, Angina, and Risk Factor Modification:  -Group instruction provided by verbal instruction, video, and written materials to support subject.  Instructors address signs and symptoms of angina and heart attacks.    Also discuss risk factors for heart disease and how to make changes to improve heart health risk factors.   CARDIAC REHAB PHASE II EXERCISE from 06/04/2017 in [place]  Date  05/28/17  Instruction Review Code  2- meets goals/outcomes      Functional Fitness:  -Group instruction provided by verbal instruction, demonstration, patient participation, and written materials to support subject.  Instructors address safety measures for doing things around the house.  Discuss how to get up and down off the floor, how to pick things up properly, how to safely get out of a chair without assistance, and balance training.   Meditation and Mindfulness:  -Group instruction provided by verbal instruction, patient participation, and written materials to support subject.  Instructor addresses importance of mindfulness and meditation practice to help reduce stress and improve awareness.  Instructor also leads participants through a meditation exercise.    Stretching for Flexibility and Mobility:  -Group instruction provided by verbal instruction, patient participation, and written materials to support subject.  Instructors lead participants through series of stretches that are designed to increase flexibility thus  improving mobility.  These stretches are additional exercise for major muscle groups that are typically performed during regular warm up and cool down.   Hands Only CPR:  -Group verbal, video, and participation provides a basic overview of AHA guidelines for community CPR. Role-play of emergencies allow participants the opportunity to practice calling for help and chest compression technique with discussion of AED use.   Hypertension: -Group verbal and written instruction that provides a basic overview of hypertension including the most recent diagnostic guidelines, risk factor reduction with self-care instructions and medication management.    Nutrition I class: Heart Healthy Eating:  -Group instruction provided by PowerPoint slides, verbal discussion, and written materials to support subject matter. The instructor gives an explanation and review of the Therapeutic Lifestyle Changes diet recommendations, which includes a discussion on lipid goals, dietary fat, sodium, fiber, plant stanol/sterol esters, sugar, and the components of a well-balanced, healthy diet.   Nutrition II class: Lifestyle Skills:  -Group instruction provided by PowerPoint slides, verbal discussion, and written materials to support subject matter. The instructor gives an explanation and review of label reading, grocery shopping for heart health, heart healthy recipe modifications, and ways to make healthier choices when eating out.   Diabetes Question & Answer:  -Group instruction provided by PowerPoint slides, verbal discussion, and written materials to support subject matter. The instructor gives an explanation and review of diabetes co-morbidities, pre- and post-prandial blood glucose goals, pre-exercise blood glucose goals, signs,  symptoms, and treatment of hypoglycemia and hyperglycemia, and foot care basics.   CARDIAC REHAB PHASE II EXERCISE from 06/04/2017 in [place]  Date  05/16/17   Educator  RD  Instruction Review Code  2- meets goals/outcomes      Diabetes Blitz:  -Group instruction provided by PowerPoint slides, verbal discussion, and written materials to support subject matter. The instructor gives an explanation and review of the physiology behind type 1 and type 2 diabetes, diabetes medications and rational behind using different medications, pre- and post-prandial blood glucose recommendations and Hemoglobin A1c goals, diabetes diet, and exercise including blood glucose guidelines for exercising safely.    Portion Distortion:  -Group instruction provided by PowerPoint slides, verbal discussion, written materials, and food models to support subject matter. The instructor gives an explanation of serving size versus portion size, changes in portions sizes over the last 20 years, and what consists of a serving from each food group.   Stress Management:  -Group instruction provided by verbal instruction, video, and written materials to support subject matter.  Instructors review role of stress in heart disease and how to cope with stress positively.     Exercising on Your Own:  -Group instruction provided by verbal instruction, power point, and written materials to support subject.  Instructors discuss benefits of exercise, components of exercise, frequency and intensity of exercise, and end points for exercise.  Also discuss use of nitroglycerin and activating EMS.  Review options of places to exercise outside of rehab.  Review guidelines for sex with heart disease.   Cardiac Drugs I:  -Group instruction provided by verbal instruction and written materials to support subject.  Instructor reviews cardiac drug classes: antiplatelets, anticoagulants, beta blockers, and statins.  Instructor discusses reasons, side effects, and lifestyle considerations for each drug class.   Cardiac Drugs II:  -Group instruction provided by verbal instruction and written materials to  support subject.  Instructor reviews cardiac drug classes: angiotensin converting enzyme inhibitors (ACE-I), angiotensin II receptor blockers (ARBs), nitrates, and calcium channel blockers.  Instructor discusses reasons, side effects, and lifestyle considerations for each drug class.   Anatomy and Physiology of the Circulatory System:  Group verbal and written instruction and models provide basic cardiac anatomy and physiology, with the coronary electrical and arterial systems. Review of: AMI, Angina, Valve disease, Heart Failure, Peripheral Artery Disease, Cardiac Arrhythmia, Pacemakers, and the ICD.   CARDIAC REHAB PHASE II EXERCISE from 06/04/2017 in [place]  Date  06/04/17  Instruction Review Code  2- meets goals/outcomes      Other Education:  -Group or individual verbal, written, or video instructions that support the educational goals of the cardiac rehab program.   Knowledge Questionnaire Score:     Knowledge Questionnaire Score - 04/03/17 1138      Knowledge Questionnaire Score   Pre Score 21/24      Core Components/Risk Factors/Patient Goals at Admission:     Personal Goals and Risk Factors at Admission - 04/03/17 1600      Core Components/Risk Factors/Patient Goals on Admission    Weight Management Obesity;Yes;Weight Loss;Weight Maintenance   Intervention Weight Management: Develop a combined nutrition and exercise program designed to reach desired caloric intake, while maintaining appropriate intake of nutrient and fiber, sodium and fats, and appropriate energy expenditure required for the weight goal.;Weight Management: Provide education and appropriate resources to help participant work on and attain dietary goals.;Weight Management/Obesity: Establish reasonable short term and long term  weight goals.;Obesity: Provide education and appropriate resources to help participant work on and attain dietary goals.   Admit Weight 179 lb 14.3 oz  (81.6 kg)   Goal Weight: Short Term 169 lb (76.7 kg)   Goal Weight: Long Term 164 lb (74.4 kg)   Improve shortness of breath with ADL's Yes   Intervention Provide education, individualized exercise plan and daily activity instruction to help decrease symptoms of SOB with activities of daily living.   Expected Outcomes Short Term: Achieves a reduction of symptoms when performing activities of daily living.   Diabetes Yes   Intervention Provide education about signs/symptoms and action to take for hypo/hyperglycemia.;Provide education about proper nutrition, including hydration, and aerobic/resistive exercise prescription along with prescribed medications to achieve blood glucose in normal ranges: Fasting glucose 65-99 mg/dL   Expected Outcomes Short Term: Participant verbalizes understanding of the signs/symptoms and immediate care of hyper/hypoglycemia, proper foot care and importance of medication, aerobic/resistive exercise and nutrition plan for blood glucose control.;Long Term: Attainment of HbA1C < 7%.   Heart Failure Yes   Intervention Provide a combined exercise and nutrition program that is supplemented with education, support and counseling about heart failure. Directed toward relieving symptoms such as shortness of breath, decreased exercise tolerance, and extremity edema.   Expected Outcomes Improve functional capacity of life;Short term: Attendance in program 2-3 days a week with increased exercise capacity. Reported lower sodium intake. Reported increased fruit and vegetable intake. Reports medication compliance.;Short term: Daily weights obtained and reported for increase. Utilizing diuretic protocols set by physician.;Long term: Adoption of self-care skills and reduction of barriers for early signs and symptoms recognition and intervention leading to self-care maintenance.   Hypertension Yes   Intervention Provide education on lifestyle modifcations including regular physical  activity/exercise, weight management, moderate sodium restriction and increased consumption of fresh fruit, vegetables, and low fat dairy, alcohol moderation, and smoking cessation.;Monitor prescription use compliance.   Expected Outcomes Short Term: Continued assessment and intervention until BP is < 140/17mm HG in hypertensive participants. < 130/89mm HG in hypertensive participants with diabetes, heart failure or chronic kidney disease.;Long Term: Maintenance of blood pressure at goal levels.   Lipids Yes   Intervention Provide education and support for participant on nutrition & aerobic/resistive exercise along with prescribed medications to achieve LDL 70mg , HDL >40mg .   Expected Outcomes Short Term: Participant states understanding of desired cholesterol values and is compliant with medications prescribed. Participant is following exercise prescription and nutrition guidelines.;Long Term: Cholesterol controlled with medications as prescribed, with individualized exercise RX and with personalized nutrition plan. Value goals: LDL < 70mg , HDL > 40 mg.   Stress Yes   Intervention Offer individual and/or small group education and counseling on adjustment to heart disease, stress management and health-related lifestyle change. Teach and support self-help strategies.;Refer participants experiencing significant psychosocial distress to appropriate mental health specialists for further evaluation and treatment. When possible, include family members and significant others in education/counseling sessions.   Expected Outcomes Short Term: Participant demonstrates changes in health-related behavior, relaxation and other stress management skills, ability to obtain effective social support, and compliance with psychotropic medications if prescribed.;Long Term: Emotional wellbeing is indicated by absence of clinically significant psychosocial distress or social isolation.      Core Components/Risk Factors/Patient  Goals Review:    Core Components/Risk Factors/Patient Goals at Discharge (Final Review):    ITP Comments:     ITP Comments    Row Name 04/03/17 [phone]  ITP Comments Medical Director, Dr. Fransico Him          Comments: [name] has completed 25 exercise sessions at cardiac rehab and did well until he started feeling bad due to weight gain. [name] is on hold due to his current hospitalization.Barnet Pall, RN,BSN 06/18/2017 6:54 PM

## 2017-06-18 NOTE — Progress Notes (Signed)
Patient ID: Trevor [name], male   DOB: 1946-09-17, 71 y.o.   MRN: [id number]   Advanced Heart Failure VAD Team Note  Subjective:    Started on CVVHD 06/17/17 via temporary cath. Tolerating 60-90 cc of fluid removal currently.  Feeling "better than he thought he would." Less SOB than yesterday. Denies dizziness or lightheadedness.  CVP 16 this morning. Weight down 3 lbs.   Hgb 8.6 -> 7.9 -> 7.9 -> 8.4. FOBT negative.   LDH 270 =>295 => 255 => 271 => 293 INR 2 -> 1.79 -> 1.66 => 1.95 WBC 13.3 -> 10.2 -> 11.0 -> 11.3  Creatinine improved on CVVHD  RHC (8/10):  RA = 17 RV = 58/18 PA = 56/25 (37) PCW = 20 (v = 35) Fick cardiac output/index = 6.4/3.2 Thermo CO/CI = 4.1/2.1 PVR = 2.6 Ao sat = 98% PA sat = 56%, 60% Increased LVAD speed to 9600   PCW down to 19  LVAD INTERROGATION:  HeartMate II LVAD:  Flow 5.8 liters/min, speed 9600, power 6.0, PI 5.4. No PI events.   Objective:    Vital Signs:   Temp:  [97.3 F (36.3 C)-97.6 F (36.4 C)] 97.6 F (36.4 C) (08/15 0357) Pulse Rate:  [64-126] 79 (08/15 0800) Resp:  [10-22] 15 (08/15 0800) BP: (89-123)/(73-104) 118/104 (08/15 0800) SpO2:  [90 %-100 %] 97 % (08/15 0800) Weight:  [196 lb 3.2 oz (89 kg)] 196 lb 3.2 oz (89 kg) (08/15 0400) Last BM Date: 06/17/17 Mean arterial Pressure Elevated in 90-100s range. > modified systolic  Intake/Output:   Intake/Output Summary (Last 24 hours) at 06/18/17 0820 Last data filed at 06/18/17 0800  Gross per 24 hour  Intake          1020.25 ml  Output             3706 ml  Net         -2685.75 ml     Physical Exam    GENERAL: Elderly and fatigued. NAD.  HEENT: Normal. NECK: Supple, JVP 12-14 cm.. Carotids OK.  CARDIAC:  Mechanical heart sounds with LVAD hum present.  LUNGS:  Diminished basilar sounds.  ABDOMEN:  NT, ND, no HSM. No bruits or masses. +BS  LVAD exit site: Well-healed and incorporated. Dressing dry and intact. No erythema or drainage. Stabilization device present and  accurately applied. Driveline dressing changed daily per sterile technique. EXTREMITIES:  Warm and dry. No cyanosis, clubbing, rash, or edema.  NEUROLOGIC:  Alert & oriented x 3. Cranial nerves grossly intact. Moves all 4 extremities w/o difficulty. Affect pleasant     Telemetry   Personally reviewed, Afib 70-80s     Labs   Basic Metabolic Panel:  Recent Labs Lab 06/15/17 0345 06/16/17 0541 06/17/17 0253 06/17/17 1502 06/18/17 0406  NA 128* 127* 125* 124* 126*  K 3.8 3.3* 3.7 3.5 3.6  CL 90* 88* 86* 85* 89*  CO2 23 23 23 23 24   GLUCOSE 220* 246* 221* 155* 218*  BUN 93* 98* 102* 107* 74*  CREATININE 6.08* 6.56* 6.73* 6.74* 4.84*  CALCIUM 8.0* 7.8* 7.9* 7.9* 8.1*  MG  --   --   --   --  1.8  PHOS 7.4* 7.8* 7.7* 8.0* 5.5*    Liver Function Tests:  Recent Labs Lab 06/13/17 0931 06/14/17 0255 06/15/17 0345 06/16/17 0541 06/17/17 0253 06/17/17 1502 06/18/17 0406  AST 28 21  --   --   --   --   --   ALT  23 19  --   --   --   --   --   ALKPHOS 102 88  --   --   --   --   --   BILITOT 1.1 0.8  --   --   --   --   --   PROT 7.2 6.5  --   --   --   --   --   ALBUMIN 3.8 3.3* 3.4* 3.4* 3.4* 3.5 3.7   No results for input(s): LIPASE, AMYLASE in the last 168 hours. No results for input(s): AMMONIA in the last 168 hours.  CBC:  Recent Labs Lab 06/14/17 0255 06/15/17 0345 06/16/17 0541 06/17/17 0253 06/18/17 0406  WBC 9.2 13.3* 10.2 11.0* 11.3*  HGB 6.5* 8.6* 7.9* 7.9* 8.4*  HCT 18.6* 24.3* 22.1* 22.6* 23.7*  MCV 90.3 87.4 88.8 85.9 87.1  PLT 108* 110* 98* 103* 115*    INR:  Recent Labs Lab 06/15/17 0345 06/15/17 1201 06/16/17 0541 06/17/17 0253 06/18/17 0406  INR 2.04 1.94 1.79 1.66 1.95    Other results:  EKG:    Imaging   Ir Fluoro Guide Cv Line Left  Result Date: 06/17/2017 INDICATION: In need of temporary intravenous access for the initiation of dialysis. EXAM: NON-TUNNELED CENTRAL VENOUS HEMODIALYSIS CATHETER PLACEMENT WITH ULTRASOUND AND  FLUOROSCOPIC GUIDANCE COMPARISON:  Chest radiograph - 06/13/2017 MEDICATIONS: None FLUOROSCOPY TIME:  48 seconds (25  mGy) COMPLICATIONS: None immediate. PROCEDURE: Informed written consent was obtained from the patient after a discussion of the risks, benefits, and alternatives to treatment. Questions regarding the procedure were encouraged and answered. Given the presence of the right anterior chest wall AICD/pacemaker, the decision was made to place a left internal jugular approach temporary dialysis catheter. As such, the left neck and chest were prepped with chlorhexidine in a sterile fashion, and a sterile drape was applied covering the operative field. Maximum barrier sterile technique with sterile gowns and gloves were used for the procedure. A timeout was performed prior to the initiation of the procedure. After the overlying soft tissues were anesthetized, a small venotomy incision was created and a micropuncture kit was utilized to access the internal jugular vein. Real-time ultrasound guidance was utilized for vascular access including the acquisition of a permanent ultrasound image documenting patency of the accessed vessel. The microwire was utilized to measure appropriate catheter length. A stiff glidewire was advanced to the level of the IVC. Under fluoroscopic guidance, the venotomy was serially dilated, ultimately allowing placement of a 20 cm temporary Mahurkar catheter with tip ultimately terminating within the superior aspect of the right atrium. Final catheter positioning was confirmed and documented with a spot radiographic image. The catheter aspirates and flushes normally. The catheter was flushed with appropriate volume heparin dwells. The catheter exit site was secured with a 0-Prolene retention suture. A dressing was placed. The patient tolerated the procedure well without immediate post procedural complication. IMPRESSION: Successful placement of a left internal jugular approach 20 cm  temporary dialysis catheter with tip terminating with in the superior aspect of the right atrium. The catheter is ready for immediate use. PLAN: This catheter may be converted to a tunneled dialysis catheter at a later date as indicated. Electronically Signed   By: Sandi Mariscal M.D.   On: 06/17/2017 15:21   Ir US Guide Vasc Access Left  Result Date: 06/17/2017 INDICATION: In need of temporary intravenous access for the initiation of dialysis. EXAM: NON-TUNNELED CENTRAL VENOUS HEMODIALYSIS CATHETER PLACEMENT WITH ULTRASOUND AND  FLUOROSCOPIC GUIDANCE COMPARISON:  Chest radiograph - 06/13/2017 MEDICATIONS: None FLUOROSCOPY TIME:  48 seconds (25  mGy) COMPLICATIONS: None immediate. PROCEDURE: Informed written consent was obtained from the patient after a discussion of the risks, benefits, and alternatives to treatment. Questions regarding the procedure were encouraged and answered. Given the presence of the right anterior chest wall AICD/pacemaker, the decision was made to place a left internal jugular approach temporary dialysis catheter. As such, the left neck and chest were prepped with chlorhexidine in a sterile fashion, and a sterile drape was applied covering the operative field. Maximum barrier sterile technique with sterile gowns and gloves were used for the procedure. A timeout was performed prior to the initiation of the procedure. After the overlying soft tissues were anesthetized, a small venotomy incision was created and a micropuncture kit was utilized to access the internal jugular vein. Real-time ultrasound guidance was utilized for vascular access including the acquisition of a permanent ultrasound image documenting patency of the accessed vessel. The microwire was utilized to measure appropriate catheter length. A stiff glidewire was advanced to the level of the IVC. Under fluoroscopic guidance, the venotomy was serially dilated, ultimately allowing placement of a 20 cm temporary Mahurkar catheter  with tip ultimately terminating within the superior aspect of the right atrium. Final catheter positioning was confirmed and documented with a spot radiographic image. The catheter aspirates and flushes normally. The catheter was flushed with appropriate volume heparin dwells. The catheter exit site was secured with a 0-Prolene retention suture. A dressing was placed. The patient tolerated the procedure well without immediate post procedural complication. IMPRESSION: Successful placement of a left internal jugular approach 20 cm temporary dialysis catheter with tip terminating with in the superior aspect of the right atrium. The catheter is ready for immediate use. PLAN: This catheter may be converted to a tunneled dialysis catheter at a later date as indicated. Electronically Signed   By: Sandi Mariscal M.D.   On: 06/17/2017 15:21     Medications:     Scheduled Medications: . amLODipine  2.5 mg Oral Daily  . atorvastatin  40 mg Oral Daily  . budesonide  0.25 mg Nebulization BID  . busPIRone  5 mg Oral BID  . darbepoetin (ARANESP) injection - NON-DIALYSIS  150 mcg Subcutaneous Q Fri-1800  . docusate sodium  100 mg Oral BID  . gabapentin  300 mg Oral QHS  . insulin aspart  0-15 Units Subcutaneous TID WC  . insulin aspart  0-5 Units Subcutaneous QHS  . insulin glargine  20 Units Subcutaneous QHS  . levothyroxine  25 mcg Oral QAC breakfast  . multivitamin with minerals  1 tablet Oral Q1200  . neomycin-bacitracin-polymyxin   Topical Daily  . octreotide  20 mg Intramuscular Once  . pantoprazole  40 mg Oral Q1200  . predniSONE  40 mg Oral Q breakfast  . sevelamer carbonate  800 mg Oral TID WC  . sildenafil  40 mg Oral TID  . sodium chloride flush  3 mL Intravenous Q12H  . ascorbic acid  1,000 mg Oral Q1200    Infusions: . sodium chloride    . sodium chloride    . furosemide 160 mg (06/18/17 0801)  . heparin 1,300 Units/hr (06/18/17 0600)  . heparin    . dialysis replacement fluid  (prismasate) 500 mL/hr at 06/18/17 0255  . dialysis replacement fluid (prismasate) 300 mL/hr at 06/17/17 1642  . dialysate (PRISMASATE) 1,000 mL/hr at 06/18/17 0803    PRN Medications: sodium chloride, acetaminophen,  albuterol, heparin, heparin, lidocaine, ondansetron (ZOFRAN) IV, oxyCODONE, sodium chloride flush, traMADol, traZODone   Patient Profile   Trevor Quackenbush Murphyis a 71 y.o.malewith a history of CAD s/p CABG x 4 2010, OSA, AS with TAVR 2015, CKD, DM2, paroxysmal atrial fibrillation, asthma, and ischemic cardiomyopathy with Medtronic ICD.   He admitted with marked volume overload and AKI despite with increase in outpatient diuretics.    Assessment/Plan:    1. Acute on chronic systolic CHF: Ischemic cardiomyopathy, EF 20-25% with RV dysfunction on 2/18 echo pre-LVAD. 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 Medtronic ICD. Overall, he has felt much better post-LVAD. However, he developed AKI and was eventually admitted in 5/18 after RHC showed elevated filling pressures. He was diuresed and creatinine came down. Since then, we have adjusted up and down his torsemide dose, with higher dose volume improves but creatinine worsens. With lower dose we can make creatinine look better but he gets volume overloaded.  He has again developed worsening volume overload with AKI, creatinine up to 5.79.  He had RHC on 8/10 as above and milrinone 0.125 started, increased to 0.25 on 8/11.  Speed increased to 9600 on 8/10.  Weight is down 3 lbs today after starting CVVH yesterday and stopped milrinone.  UF around 100 cc/hr altogether.  Still making urine.  CVP 16.  He remains volume overloaded on exam, creatinine now 6.08.  - Suspect significant RV dysfunction is making volume management difficult.  - Continue CRRT. Appreciate Nephrology support.  - No ASA with h/o GI bleeding => hold warfarin for now in case procedure needed, cover with heparin gtt while INR subtherapeutic.  2. CAD: s/p CABG. Denies CP.  Continue atorvastatin. No change.   3. AKI on CKD stage IV:  Continue to follow on CRRT.  Needs more fluid off.  So far tolerating.  We have located protocols for outpatient HD if required.  4. Atrial fibrillation: Chronic. Warfarin held for now, on heparin gtt.  - Rate stable. No change.  5. RV failure: Continue Revatio 40 mg TID.  Now off milrinone.  - Continue CRRT.   6. HTN:  - MAPs elevated this am.  Increase amlodipine to 5 mg daily and continue to follow.  7. Anemia: Baseline anemia of renal disease/chronic disease but recent bleeding from colonic AVMs. No overt bleeding, may be primarily anemia of renal disease at this point. FOBT negative. Got 2 units PRBCs with appropriate rise in Hgb.  - Stable at 8.4 this am. No overt bleeding. Continue to follow.   8. 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  No overt bleeding, may be related to anemia of renal disease. FOBT negative this admission.  - Continue octreotide injections. No change.  9. Skin cancer: On neck and RLE. Needs excision, does not have to stop warfarin. Will address further as outpatient. No change.  - WOC have assessed biopsy sites  10. Possible left wrist gout flare:  - Day 3/3 prednisone 40 mg daily. Improved.   I reviewed the LVAD parameters from today, and compared the results to the patient's prior recorded data.  No programming changes were made.  The LVAD is functioning within specified parameters.  The patient performs LVAD self-test daily.  LVAD interrogation was negative for any significant power changes, alarms or PI events/speed drops.  LVAD equipment check completed and is in good working order.  Back-up equipment present.   LVAD education done on emergency procedures and precautions and reviewed exit site care.  Length of Stay: 5  Annamaria Helling 06/18/2017, 8:20 AM  VAD Team --- VAD ISSUES ONLY--- Pager [phone] (7am - 7am)  Advanced Heart Failure Team  Pager  [phone] (M-F; 7a - 4p)  Please contact [facility] for night-coverage after hours (4p -7a ) and weekends on [email or web address]  Patient seen with PA, agree with the above note.    Patient remains volume overloaded with CVP 16.  Creatinine continued to rise with inadequate response to high dose diuretics.  Yesterday, he was started on CVVH.  He has tolerated this so far, currently UF of around 100 cc/hr.  Weight down 3 lbs.  MAP is actually elevated in 100s.  I have him off milrinone now. CVP 16 today.  - Stopping IV Lasix, continue CVVH.  May be able to increase rate, will await renal input.  - Increase amlodipine back to 5 mg daily, may need additional antihypertensives.   - Will follow response to CVVH, may end up needing long-term HD.    Leave off warfarin for now, cover with heparin gtt. Hgb stable.    Suspected Gout flare left wrist, better today with prednisone burst.  LVAD parameters stable.   Loralie Champagne 06/18/2017 10:27 AM

## 2017-06-18 NOTE — Progress Notes (Signed)
LVAD Coordinator Rounding Note:  Admitted 06/13/17 due to RV failure, marked volume overload.   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 Darcey Nora under Destination Therapy criteria.  Vital signs: HR: 81 Doppler Pressure: 861-UOHFGBMS systolic Automatic BP: 111/55 (106) O2 Sat: 100% on RA Wt in lbs: 197>197>199>199>196   LVAD interrogation reveals:   Speed:9600 Flow: 5.7 Power:  6.3w PI: 5.2 Alarms: none Events:  2 PI events Fixed speed: 9600 Low speed limit: 9000  Drive Line: Sorbaview dressing dry and intact; needs weekly dressing changes. Next dressing change is due 8/20.  Labs:  LDH trend: 317>270>295>255>271>293  INR trend: 2.15>2.3>2.04>1.94>1.79>1.66>1.95  Gtts: Heparin 1300 u/hr  Anticoagulation Plan: -INR Goal:  2.0 - 2.5 -ASA Dose: 81 mg daily - on hold  Device: -Medtronic single lead -Therapies: on   Plan/Recommendations:   1. Continue CRRT. VAD is tolerating CVVH well.  2. Call VAD pager if any VAD equipment issues at [phone].  [name], BSN  VAD Coordinator [phone] 24/7 pager

## 2017-06-19 ENCOUNTER — Encounter (HOSPITAL_COMMUNITY): Payer: Medicare Other

## 2017-06-19 ENCOUNTER — Other Ambulatory Visit (HOSPITAL_COMMUNITY): Payer: Self-pay | Admitting: Cardiology

## 2017-06-19 DIAGNOSIS — Z95811 Presence of heart assist device: Secondary | ICD-10-CM | POA: Diagnosis not present

## 2017-06-19 LAB — MAGNESIUM: MAGNESIUM: 2 mg/dL (ref 1.7–2.4)

## 2017-06-19 LAB — RENAL FUNCTION PANEL
ALBUMIN: 3.7 g/dL (ref 3.5–5.0)
ALBUMIN: 3.5 g/dL (ref 3.5–5.0)
ANION GAP: 9 (ref 5–15)
ANION GAP: 8 (ref 5–15)
BUN: 42 mg/dL — AB (ref 6–20)
BUN: 32 mg/dL — AB (ref 6–20)
CALCIUM: 8.5 mg/dL — AB (ref 8.9–10.3)
CALCIUM: 8.5 mg/dL — AB (ref 8.9–10.3)
CO2: 27 mmol/L (ref 22–32)
CO2: 27 mmol/L (ref 22–32)
Chloride: 96 mmol/L — ABNORMAL LOW (ref 101–111)
Chloride: 99 mmol/L — ABNORMAL LOW (ref 101–111)
Creatinine, Ser: 2.96 mg/dL — ABNORMAL HIGH (ref 0.61–1.24)
Creatinine, Ser: 2.5 mg/dL — ABNORMAL HIGH (ref 0.61–1.24)
GFR calc Af Amer: 23 mL/min — ABNORMAL LOW (ref >60)
GFR calc Af Amer: 28 mL/min — ABNORMAL LOW (ref >60)
GFR calc non Af Amer: 24 mL/min — ABNORMAL LOW (ref >60)
GFR, EST NON AFRICAN AMERICAN: 20 mL/min — AB (ref >60)
GLUCOSE: 157 mg/dL — AB (ref 65–99)
Glucose, Bld: 231 mg/dL — ABNORMAL HIGH (ref 65–99)
PHOSPHORUS: 2.4 mg/dL — AB (ref 2.5–4.6)
POTASSIUM: 3.4 mmol/L — AB (ref 3.5–5.1)
POTASSIUM: 3.5 mmol/L (ref 3.5–5.1)
Phosphorus: 3 mg/dL (ref 2.5–4.6)
SODIUM: 134 mmol/L — AB (ref 135–145)
Sodium: 132 mmol/L — ABNORMAL LOW (ref 135–145)

## 2017-06-19 LAB — CBC
HCT: 24.5 % — ABNORMAL LOW (ref 39.0–52.0)
HEMOGLOBIN: 8.5 g/dL — AB (ref 13.0–17.0)
MCH: 31.3 pg (ref 26.0–34.0)
MCHC: 34.7 g/dL (ref 30.0–36.0)
MCV: 90.1 fL (ref 78.0–100.0)
PLATELETS: 108 10*3/uL — AB (ref 150–400)
RBC: 2.72 MIL/uL — ABNORMAL LOW (ref 4.22–5.81)
RDW: 15.2 % (ref 11.5–15.5)
WBC: 17.5 10*3/uL — ABNORMAL HIGH (ref 4.0–10.5)

## 2017-06-19 LAB — GLUCOSE, CAPILLARY
Glucose-Capillary: 116 mg/dL — ABNORMAL HIGH (ref 65–99)
Glucose-Capillary: 148 mg/dL — ABNORMAL HIGH (ref 65–99)
Glucose-Capillary: 241 mg/dL — ABNORMAL HIGH (ref 65–99)

## 2017-06-19 LAB — HEPARIN LEVEL (UNFRACTIONATED): Heparin Unfractionated: 0.48 IU/mL (ref 0.30–0.70)

## 2017-06-19 LAB — PROTIME-INR
INR: 1.72
PROTHROMBIN TIME: 20.4 s — AB (ref 11.4–15.2)

## 2017-06-19 LAB — LACTATE DEHYDROGENASE: LDH: 304 U/L — AB (ref 98–192)

## 2017-06-19 MED ORDER — ROPINIROLE HCL 0.25 MG PO TABS
0.2500 mg | ORAL_TABLET | Freq: Every day | ORAL | Status: DC
  Administered 2017-06-19 – 2017-06-27 (×9): 0.25 mg via ORAL
  Filled 2017-06-19 (×10): qty 1

## 2017-06-19 MED ORDER — HYDRALAZINE HCL 25 MG PO TABS
37.5000 mg | ORAL_TABLET | Freq: Three times a day (TID) | ORAL | Status: DC
  Administered 2017-06-19 – 2017-06-24 (×14): 37.5 mg via ORAL
  Filled 2017-06-19 (×14): qty 2

## 2017-06-19 MED ORDER — SODIUM PHOSPHATES 45 MMOLE/15ML IV SOLN
20.0000 mmol | Freq: Once | INTRAVENOUS | Status: AC
  Administered 2017-06-19: 20 mmol via INTRAVENOUS
  Filled 2017-06-19: qty 6.67

## 2017-06-19 MED ORDER — HYDRALAZINE HCL 25 MG PO TABS
12.5000 mg | ORAL_TABLET | Freq: Once | ORAL | Status: AC
  Administered 2017-06-19: 12.5 mg via ORAL
  Filled 2017-06-19: qty 1

## 2017-06-19 MED ORDER — AMLODIPINE BESYLATE 5 MG PO TABS: 5.0000 mg | ORAL_TABLET | Freq: Every day | ORAL | 3 refills | Status: DC

## 2017-06-19 MED ORDER — "THROMBI-PAD 3""X3"" EX PADS"
1.0000 | MEDICATED_PAD | Freq: Once | CUTANEOUS | Status: AC
  Administered 2017-06-19: 1 via TOPICAL
  Filled 2017-06-19: qty 1

## 2017-06-19 NOTE — Progress Notes (Signed)
LVAD Coordinator Rounding Note:  Admitted 06/13/17 due to RV failure, marked volume overload.   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 Darcey Nora under Destination Therapy criteria.  Pt is down 3.5L since yesterday and down 12.5L since admit. Nurse reports bleeding at temp dialysis cath-currently has a pressure dressing in place with thrombi pad that is controlling the bleeding. Pt is mobile getting up and down to the chair.   Vital signs: HR: 87 Doppler Pressure: 96 Automatic BP: 116/96 (103) O2 Sat: 100% on RA Wt in lbs: 197>197>199>199>196>190   LVAD interrogation reveals:   Speed:9600 Flow: 6.3 Power:  6.8w PI: 3.2 Alarms: none Events:  None Fixed speed: 9600 Low speed limit: 9000  Drive Line: Sorbaview dressing dry and intact; needs weekly dressing changes. Next dressing change is due 8/20.  Labs:  LDH trend: 317>270>295>255>271>293>304  INR trend: 2.15>2.3>2.04>1.94>1.79>1.66>1.95>1.72  Gtts: Heparin 1300 u/hr  Anticoagulation Plan: -INR Goal:  2.0 - 2.5 -ASA Dose: 81 mg daily - on hold  Device: -Medtronic single lead -Therapies: on   Plan/Recommendations:   1. Continue CRRT. VAD is tolerating CVVH well.  2. Call VAD pager if any VAD equipment issues at [phone].  [name], BSN  VAD Coordinator [phone] 24/7 pager

## 2017-06-19 NOTE — Progress Notes (Addendum)
Patient ID: Trevor [name], male   DOB: Oct 23, 1946, 71 y.o.   MRN: [id number]   Advanced Heart Failure VAD Team Note  Subjective:    Started on CVVHD 06/17/17 via temporary cath. UF currently at 125 cc/hr.  UOP also starting to improve.   Weight down 6 lbs.  CVP 16 this morning.  Still has orthopnea but breathing overall improving.   Hgb 8.6 -> 7.9 -> 7.9 -> 8.4 -> 8.5. FOBT negative.   Warfarin on hold, heparin gtt ongoing.   WBCs up today but patient afebrile, probably from prednisone burst.   LDH 270 =>295 => 255 => 271 => 293 => 304 WBC 13.3 -> 10.2 -> 11.0 -> 11.3 -> 17.5  MAP 80s-100s  RHC (8/10):  RA = 17 RV = 58/18 PA = 56/25 (37) PCW = 20 (v = 35) Fick cardiac output/index = 6.4/3.2 Thermo CO/CI = 4.1/2.1 PVR = 2.6 Ao sat = 98% PA sat = 56%, 60% Increased LVAD speed to 9600   PCW down to 19  LVAD INTERROGATION:  HeartMate II LVAD:  Flow 5.7 liters/min, speed 9600, power 6.2, PI 4.8. 3 PI events.   Objective:    Vital Signs:   Temp:  [97.5 F (36.4 C)-98.5 F (36.9 C)] 98 F (36.7 C) (08/16 0400) Pulse Rate:  [67-108] 74 (08/16 0600) Resp:  [9-19] 11 (08/16 0600) BP: (89-127)/(66-112) 105/95 (08/16 0600) SpO2:  [93 %-100 %] 97 % (08/16 0600) Weight:  [190 lb 8 oz (86.4 kg)] 190 lb 8 oz (86.4 kg) (08/16 0420) Last BM Date: 06/17/17 Mean arterial Pressure Elevated in 90-100s range. > modified systolic  Intake/Output:   Intake/Output Summary (Last 24 hours) at 06/19/17 0733 Last data filed at 06/19/17 0600  Gross per 24 hour  Intake             1148 ml  Output             5600 ml  Net            -4452 ml     Physical Exam    GENERAL: Elderly and fatigued. NAD.  HEENT: Normal. NECK: Supple, JVP 14 cm. Carotids OK.  CARDIAC:  Mechanical heart sounds with LVAD hum present.  LUNGS:  Diminished basilar sounds.  ABDOMEN:  NT, ND, no HSM. No bruits or masses. +BS  LVAD exit site: Well-healed and incorporated. Dressing dry and intact. No erythema or  drainage. Stabilization device present and accurately applied. Driveline dressing changed daily per sterile technique. EXTREMITIES:  Warm and dry. No cyanosis, clubbing, rash.  2+ edema to knees bilaterally.  NEUROLOGIC:  Alert & oriented x 3. Cranial nerves grossly intact. Moves all 4 extremities w/o difficulty. Affect pleasant     Telemetry   Personally reviewed, Afib 70s     Labs   Basic Metabolic Panel:  Recent Labs Lab 06/17/17 0253 06/17/17 1502 06/18/17 0406 06/18/17 1600 06/19/17 0400  NA 125* 124* 126* 133* 132*  K 3.7 3.5 3.6 3.4* 3.4*  CL 86* 85* 89* 99* 96*  CO2 23 23 24 24 27   GLUCOSE 221* 155* 218* 216* 231*  BUN 102* 107* 74* 52* 42*  CREATININE 6.73* 6.74* 4.84* 3.43* 2.96*  CALCIUM 7.9* 7.9* 8.1* 7.7* 8.5*  MG  --   --  1.8  --  2.0  PHOS 7.7* 8.0* 5.5* 3.2 2.4*    Liver Function Tests:  Recent Labs Lab 06/13/17 0931 06/14/17 0255  06/17/17 0253 06/17/17 1502 06/18/17 0406 06/18/17 1600  06/19/17 0400  AST 28 21  --   --   --   --   --   --   ALT 23 19  --   --   --   --   --   --   ALKPHOS 102 88  --   --   --   --   --   --   BILITOT 1.1 0.8  --   --   --   --   --   --   PROT 7.2 6.5  --   --   --   --   --   --   ALBUMIN 3.8 3.3*  < > 3.4* 3.5 3.7 3.3* 3.7  < > = values in this interval not displayed. No results for input(s): LIPASE, AMYLASE in the last 168 hours. No results for input(s): AMMONIA in the last 168 hours.  CBC:  Recent Labs Lab 06/15/17 0345 06/16/17 0541 06/17/17 0253 06/18/17 0406 06/19/17 0400  WBC 13.3* 10.2 11.0* 11.3* 17.5*  HGB 8.6* 7.9* 7.9* 8.4* 8.5*  HCT 24.3* 22.1* 22.6* 23.7* 24.5*  MCV 87.4 88.8 85.9 87.1 90.1  PLT 110* 98* 103* 115* 108*    INR:  Recent Labs Lab 06/15/17 1201 06/16/17 0541 06/17/17 0253 06/18/17 0406 06/19/17 0400  INR 1.94 1.79 1.66 1.95 1.72    Other results:  EKG:    Imaging   Ir Fluoro Guide Cv Line Left  Result Date: 06/17/2017 INDICATION: In need of temporary  intravenous access for the initiation of dialysis. EXAM: NON-TUNNELED CENTRAL VENOUS HEMODIALYSIS CATHETER PLACEMENT WITH ULTRASOUND AND FLUOROSCOPIC GUIDANCE COMPARISON:  Chest radiograph - 06/13/2017 MEDICATIONS: None FLUOROSCOPY TIME:  48 seconds (25  mGy) COMPLICATIONS: None immediate. PROCEDURE: Informed written consent was obtained from the patient after a discussion of the risks, benefits, and alternatives to treatment. Questions regarding the procedure were encouraged and answered. Given the presence of the right anterior chest wall AICD/pacemaker, the decision was made to place a left internal jugular approach temporary dialysis catheter. As such, the left neck and chest were prepped with chlorhexidine in a sterile fashion, and a sterile drape was applied covering the operative field. Maximum barrier sterile technique with sterile gowns and gloves were used for the procedure. A timeout was performed prior to the initiation of the procedure. After the overlying soft tissues were anesthetized, a small venotomy incision was created and a micropuncture kit was utilized to access the internal jugular vein. Real-time ultrasound guidance was utilized for vascular access including the acquisition of a permanent ultrasound image documenting patency of the accessed vessel. The microwire was utilized to measure appropriate catheter length. A stiff glidewire was advanced to the level of the IVC. Under fluoroscopic guidance, the venotomy was serially dilated, ultimately allowing placement of a 20 cm temporary Mahurkar catheter with tip ultimately terminating within the superior aspect of the right atrium. Final catheter positioning was confirmed and documented with a spot radiographic image. The catheter aspirates and flushes normally. The catheter was flushed with appropriate volume heparin dwells. The catheter exit site was secured with a 0-Prolene retention suture. A dressing was placed. The patient tolerated the  procedure well without immediate post procedural complication. IMPRESSION: Successful placement of a left internal jugular approach 20 cm temporary dialysis catheter with tip terminating with in the superior aspect of the right atrium. The catheter is ready for immediate use. PLAN: This catheter may be converted to a tunneled dialysis catheter at a later date as  indicated. Electronically Signed   By: Sandi Mariscal M.D.   On: 06/17/2017 15:21   Ir US Guide Vasc Access Left  Result Date: 06/17/2017 INDICATION: In need of temporary intravenous access for the initiation of dialysis. EXAM: NON-TUNNELED CENTRAL VENOUS HEMODIALYSIS CATHETER PLACEMENT WITH ULTRASOUND AND FLUOROSCOPIC GUIDANCE COMPARISON:  Chest radiograph - 06/13/2017 MEDICATIONS: None FLUOROSCOPY TIME:  48 seconds (25  mGy) COMPLICATIONS: None immediate. PROCEDURE: Informed written consent was obtained from the patient after a discussion of the risks, benefits, and alternatives to treatment. Questions regarding the procedure were encouraged and answered. Given the presence of the right anterior chest wall AICD/pacemaker, the decision was made to place a left internal jugular approach temporary dialysis catheter. As such, the left neck and chest were prepped with chlorhexidine in a sterile fashion, and a sterile drape was applied covering the operative field. Maximum barrier sterile technique with sterile gowns and gloves were used for the procedure. A timeout was performed prior to the initiation of the procedure. After the overlying soft tissues were anesthetized, a small venotomy incision was created and a micropuncture kit was utilized to access the internal jugular vein. Real-time ultrasound guidance was utilized for vascular access including the acquisition of a permanent ultrasound image documenting patency of the accessed vessel. The microwire was utilized to measure appropriate catheter length. A stiff glidewire was advanced to the level of the  IVC. Under fluoroscopic guidance, the venotomy was serially dilated, ultimately allowing placement of a 20 cm temporary Mahurkar catheter with tip ultimately terminating within the superior aspect of the right atrium. Final catheter positioning was confirmed and documented with a spot radiographic image. The catheter aspirates and flushes normally. The catheter was flushed with appropriate volume heparin dwells. The catheter exit site was secured with a 0-Prolene retention suture. A dressing was placed. The patient tolerated the procedure well without immediate post procedural complication. IMPRESSION: Successful placement of a left internal jugular approach 20 cm temporary dialysis catheter with tip terminating with in the superior aspect of the right atrium. The catheter is ready for immediate use. PLAN: This catheter may be converted to a tunneled dialysis catheter at a later date as indicated. Electronically Signed   By: Sandi Mariscal M.D.   On: 06/17/2017 15:21     Medications:     Scheduled Medications: . amLODipine  5 mg Oral Daily  . atorvastatin  40 mg Oral Daily  . budesonide  0.25 mg Nebulization BID  . busPIRone  5 mg Oral BID  . Chlorhexidine Gluconate Cloth  6 each Topical Daily  . darbepoetin (ARANESP) injection - NON-DIALYSIS  150 mcg Subcutaneous Q Fri-1800  . docusate sodium  100 mg Oral BID  . gabapentin  300 mg Oral QHS  . hydrALAZINE  12.5 mg Oral Once  . hydrALAZINE  37.5 mg Oral Q8H  . insulin aspart  0-15 Units Subcutaneous TID WC  . insulin aspart  0-5 Units Subcutaneous QHS  . insulin glargine  20 Units Subcutaneous QHS  . levothyroxine  25 mcg Oral QAC breakfast  . multivitamin with minerals  1 tablet Oral Q1200  . neomycin-bacitracin-polymyxin   Topical Daily  . pantoprazole  40 mg Oral Q1200  . sevelamer carbonate  800 mg Oral TID WC  . sildenafil  40 mg Oral TID  . sodium chloride flush  10-40 mL Intracatheter Q12H  . sodium chloride flush  3 mL Intravenous  Q12H  . ascorbic acid  1,000 mg Oral Q1200    Infusions: .  sodium chloride    . sodium chloride    . heparin 1,300 Units/hr (06/19/17 0615)  . heparin    . dialysis replacement fluid (prismasate) 500 mL/hr at 06/18/17 2317  . dialysis replacement fluid (prismasate) 300 mL/hr at 06/18/17 1014  . dialysate (PRISMASATE) 1,000 mL/hr at 06/19/17 0424    PRN Medications: sodium chloride, acetaminophen, albuterol, heparin, heparin, ondansetron (ZOFRAN) IV, oxyCODONE, sodium chloride flush, sodium chloride flush, traMADol, traZODone   Patient Profile   Trevor [name] a 71 y.o.malewith a history of CAD s/p CABG x 4 2010, OSA, AS with TAVR 2015, CKD, DM2, paroxysmal atrial fibrillation, asthma, and ischemic cardiomyopathy with Medtronic ICD.   He admitted with marked volume overload and AKI despite with increase in outpatient diuretics.    Assessment/Plan:    1. Acute on chronic systolic CHF: Ischemic cardiomyopathy, EF 20-25% with RV dysfunction on 2/18 echo pre-LVAD. 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 Medtronic ICD. Overall, he has felt much better post-LVAD. However, he developed AKI and was eventually admitted in 5/18 after RHC showed elevated filling pressures. He was diuresed and creatinine came down. Since then, we have adjusted up and down his torsemide dose, with higher dose volume improves but creatinine worsens. With lower dose we can make creatinine look better but he gets volume overloaded.  He has again developed worsening volume overload with AKI, creatinine up to 5.79 at admission.  He had RHC on 8/10 as above and milrinone 0.125 started, increased to 0.25 on 8/11.  Speed increased to 9600 on 8/10.  Weight is down another 6 lbs today on CVVH and off milrinone. UF 125 cc/hr.  Still making urine.  CVP 16.  He remains volume overloaded on exam with orthopnea.  - Continue CVVH, needs more fluid off.   - No ASA with h/o GI bleeding => hold warfarin for now in case procedure  needed, covering with heparin gtt while INR subtherapeutic.  2. CAD: s/p CABG. Denies CP. Continue atorvastatin. No change.   3. AKI on CKD stage IV:  Continue to follow on CRRT.  Needs more fluid off.  So far tolerating.  We have located protocols for outpatient HD if required.  4. Atrial fibrillation: Chronic. Warfarin held for now, on heparin gtt.  - Rate stable. No change.  5. RV failure: Continue Revatio 40 mg TID.  Now off milrinone.   6. HTN: MAP still elevated at times, will increase hydralazine to 37.5 mg tid.  Remains on amlodipine 5 mg daily.  7. Anemia: Baseline anemia of renal disease/chronic disease but recent bleeding from colonic AVMs. No overt bleeding, may be primarily anemia of renal disease at this point. FOBT negative. Got 2 units PRBCs with appropriate rise in Hgb.  - Stable at 8.5 this am. No overt bleeding. Continue to follow.   8. 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  No overt bleeding, may be related to anemia of renal disease. FOBT negative this admission.  - Continue octreotide injections. No change.  9. Skin cancer: On neck and RLE. Needs excision, does not have to stop warfarin. Will address further as outpatient. No change.  - WOC have assessed biopsy sites  10. Possible left wrist gout flare:  - Improved after he had steroid burst (this likely caused increased WBCs).  11. Thrombocytopenia: Mild, chronic. Stable.   I reviewed the LVAD parameters from today, and compared the results to the patient's prior recorded data.  No programming changes were made.  The  LVAD is functioning within specified parameters.  The patient performs LVAD self-test daily.  LVAD interrogation was negative for any significant power changes, alarms or PI events/speed drops.  LVAD equipment check completed and is in good working order.  Back-up equipment present.   LVAD education done on emergency procedures and precautions and reviewed exit site  care.  Length of Stay: 6  Loralie Champagne, MD 06/19/2017, 7:33 AM  VAD Team --- VAD ISSUES ONLY--- Pager [phone] (7am - 7am)  Advanced Heart Failure Team  Pager [phone] (M-F; 7a - 4p)  Please contact [facility] for night-coverage after hours (4p -7a ) and weekends on [email or web address]

## 2017-06-19 NOTE — Progress Notes (Signed)
Patient ID: Trevor [name], male   DOB: Sep 14, 1946, 71 y.o.   MRN: [id number] S:No complaints. Breathing overall improving, still with some orthopnea. Feels much better on CRRT.   Interval updates: Tolerating CRRT well, UF currently 142mls/hr. Neg 3.5L from CRRT and UOP still ~2L yesterday. Down 12.5 L since admit.  Weight 190, down 6 lbs from yest.  BP low normals off milrinone.   O:BP (!) 105/95   Pulse 74   Temp 98 F (36.7 C) (Oral)   Resp 11   Ht 5\' 5"  (1.651 m)   Wt 190 lb 8 oz (86.4 kg)   SpO2 97%   BMI 31.70 kg/m     Intake/Output Summary (Last 24 hours) at 06/19/17 0809 Last data filed at 06/19/17 0600  Gross per 24 hour  Intake             1015 ml  Output             5500 ml  Net            -4485 ml   Intake/Output: I/O last 3 completed shifts: In: 1347.8 [P.O.:770; I.V.:451.8; Other:10; IV Piggyback:116] Out: 6144 [Urine:3150; RXVQM:0867]  Intake/Output this shift:  No intake/output data recorded. Weight change: -5 lb 11.2 oz (-2.586 kg)   Filed Weights   06/17/17 0400 06/18/17 0400 06/19/17 0420  Weight: 199 lb 11.8 oz (90.6 kg) 196 lb 3.2 oz (89 kg) 190 lb 8 oz (86.4 kg)   Gen:NAD, very pleasant.  YPP:[id number] hum around precordium Resp:decreased BS at bases, otherwise clear.  Abd:+BS, not tender. Driveline dressing intact, clean.  Ext: Edema improved. HD Access: Temp HD cath placed 8/14   Recent Labs Lab 06/13/17 0931  06/14/17 0255 06/15/17 0345 06/16/17 0541 06/17/17 0253 06/17/17 1502 06/18/17 0406 06/18/17 1600 06/19/17 0400  NA 133*  < > 130* 128* 127* 125* 124* 126* 133* 132*  K 4.3  < > 3.6 3.8 3.3* 3.7 3.5 3.6 3.4* 3.4*  CL 96*  < > 94* 90* 88* 86* 85* 89* 99* 96*  CO2 22  < > 22 23 23 23 23 24 24 27   GLUCOSE 162*  < > 159* 220* 246* 221* 155* 218* 216* 231*  BUN 91*  < > 92* 93* 98* 102* 107* 74* 52* 42*  CREATININE 5.80*  < > 5.79* 6.08* 6.56* 6.73* 6.74* 4.84* 3.43* 2.96*  ALBUMIN 3.8  --  3.3* 3.4* 3.4* 3.4* 3.5 3.7 3.3* 3.7   CALCIUM 8.2*  < > 8.1* 8.0* 7.8* 7.9* 7.9* 8.1* 7.7* 8.5*  PHOS  --   --   --  7.4* 7.8* 7.7* 8.0* 5.5* 3.2 2.4*  AST 28  --  21  --   --   --   --   --   --   --   ALT 23  --  19  --   --   --   --   --   --   --   < > = values in this interval not displayed. Liver Function Tests:  Recent Labs Lab 06/13/17 0931 06/14/17 0255  06/18/17 0406 06/18/17 1600 06/19/17 0400  AST 28 21  --   --   --   --   ALT 23 19  --   --   --   --   ALKPHOS 102 88  --   --   --   --   BILITOT 1.1 0.8  --   --   --   --  PROT 7.2 6.5  --   --   --   --   ALBUMIN 3.8 3.3*  < > 3.7 3.3* 3.7  < > = values in this interval not displayed. No results for input(s): LIPASE, AMYLASE in the last 168 hours. No results for input(s): AMMONIA in the last 168 hours. CBC:  Recent Labs Lab 06/15/17 0345 06/16/17 0541 06/17/17 0253 06/18/17 0406 06/19/17 0400  WBC 13.3* 10.2 11.0* 11.3* 17.5*  HGB 8.6* 7.9* 7.9* 8.4* 8.5*  HCT 24.3* 22.1* 22.6* 23.7* 24.5*  MCV 87.4 88.8 85.9 87.1 90.1  PLT 110* 98* 103* 115* 108*  CBG:  Recent Labs Lab 06/17/17 2128 06/18/17 0757 06/18/17 1115 06/18/17 1559 06/18/17 2158  GLUCAP 285* 213* 185* 191* 319*   Iron Studies:  Iron/TIBC/Ferritin/ %Sat    Component Value Date/Time   IRON 125 06/14/2017 1605   TIBC 221 (L) 06/14/2017 1605   FERRITIN 622 (H) 06/14/2017 1605   IRONPCTSAT 57 (H) 06/14/2017 1605   Studies/Results: Result Date: 06/17/2017  EXAM: NON-TUNNELED CENTRAL VENOUS HEMODIALYSIS CATHETER PLACEMENT WITH ULTRASOUND AND FLUOROSCOPIC GUIDANCE COMPARISON:  IMPRESSION: Successful placement of a left internal jugular approach 20 cm temporary dialysis catheter with tip terminating with in the superior aspect of the right atrium. The catheter is ready for immediate use. PLAN: This catheter may be converted to a tunneled dialysis catheter at a later date as indicated. Electronically Signed   By: Sandi Mariscal M.D.   On: 06/17/2017 15:21   . amLODipine  5 mg  Oral Daily  . atorvastatin  40 mg Oral Daily  . budesonide  0.25 mg Nebulization BID  . busPIRone  5 mg Oral BID  . Chlorhexidine Gluconate Cloth  6 each Topical Daily  . darbepoetin (ARANESP) injection - NON-DIALYSIS  150 mcg Subcutaneous Q Fri-1800  . docusate sodium  100 mg Oral BID  . gabapentin  300 mg Oral QHS  . hydrALAZINE  37.5 mg Oral Q8H  . insulin aspart  0-15 Units Subcutaneous TID WC  . insulin aspart  0-5 Units Subcutaneous QHS  . insulin glargine  20 Units Subcutaneous QHS  . levothyroxine  25 mcg Oral QAC breakfast  . multivitamin with minerals  1 tablet Oral Q1200  . neomycin-bacitracin-polymyxin   Topical Daily  . pantoprazole  40 mg Oral Q1200  . sevelamer carbonate  800 mg Oral TID WC  . sildenafil  40 mg Oral TID  . sodium chloride flush  10-40 mL Intracatheter Q12H  . sodium chloride flush  3 mL Intravenous Q12H  . ascorbic acid  1,000 mg Oral Q1200   CMP Latest Ref Rng & Units 06/19/2017 06/18/2017 06/18/2017  Glucose 65 - 99 mg/dL 231(H) 216(H) 218(H)  BUN 6 - 20 mg/dL 42(H) 52(H) 74(H)  Creatinine 0.61 - 1.24 mg/dL 2.96(H) 3.43(H) 4.84(H)  Sodium 135 - 145 mmol/L 132(L) 133(L) 126(L)  Potassium 3.5 - 5.1 mmol/L 3.4(L) 3.4(L) 3.6  Chloride 101 - 111 mmol/L 96(L) 99(L) 89(L)  CO2 22 - 32 mmol/L 27 24 24   Calcium 8.9 - 10.3 mg/dL 8.5(L) 7.7(L) 8.1(L)  Total Protein 6.5 - 8.1 g/dL - - -  Total Bilirubin 0.3 - 1.2 mg/dL - - -  Alkaline Phos 38 - 126 U/L - - -  AST 15 - 41 U/L - - -  ALT 17 - 63 U/L - - -   CBC Latest Ref Rng & Units 06/19/2017 06/18/2017 06/17/2017  WBC 4.0 - 10.5 K/uL 17.5(H) 11.3(H) 11.0(H)  Hemoglobin 13.0 -  17.0 g/dL 8.5(L) 8.4(L) 7.9(L)  Hematocrit 39.0 - 52.0 % 24.5(L) 23.7(L) 22.6(L)  Platelets 150 - 400 K/uL 108(L) 115(L) 103(L)   Assessment/Plan:  1. AKI/CKD stage 4 in setting of decompensated CHF/cardiorenal syndrome: Doing well on CRRT via temp HD cath. Started 8/14. ~3.5L out from CRRT with 2L UOP OFF LASIX. HF contacting  [facility] regarding their protocol and policy regarding dialysis in LVAD patients. He would otherwise be an excellent Hd candidate given his excellent functional status PTA.   1. Continue CRRT 2. Appreciate assistance from HF with obtaining protocol re: LVAD + HD  3. Recommend AVG vs AVF placement as shorter maturation time, IF able to tolerate IHD & outpatient HD can be arranged.  2. Acute on chronic systolic CHF/ischemic Cardiomyopathy: EF 20-25% s/p LVAD placement as well as right heart failure seen on RHC 06/13/17. OFF Milrinone today, BP low-normal.  1. Per HF 3. Hyperphosphatemia: Started on Renvela 800mg  TID WC 8/12, improved. 2.4 today. 1. Continue Renvela 4. A fib on Heparin 1. On warfarin at home. Hold currently as may have upcoming procedure. 5. RV failure- as above, Off milrinone.  6. Hypotension: OK off support 7. Disposition- doing OK so far on trial of CRRT. This remains a difficult situation due to his cardiac status, presence of LVAD and poor response with IV diuretics. He understands that if he does not tolerate this that there is nothing else to be offered.    Einar Gip, DO PGY-2 [facility] [phone]  I have seen and examined this patient and agree with plan and assessment in the above note with renal recommendations/intervention highlighted.  Tolerating UF well, will ^ goal from 100-200 ml/hr. Trevor John A Josey Forcier,MD 06/19/2017 12:10 PM

## 2017-06-19 NOTE — Progress Notes (Signed)
ANTICOAGULATION CONSULT NOTE  Pharmacy Consult for heparin Indication: LVAD  No Known Allergies  Patient Measurements: Height: 5\' 5"  (165.1 cm) Weight: 190 lb 8 oz (86.4 kg) IBW/kg (Calculated) : 61.5 Heparin Dosing Weight = 80.7 kg  Vital Signs: Temp: 98 F (36.7 C) (08/16 0400) Temp Source: Oral (08/16 0400) BP: 105/95 (08/16 0600) Pulse Rate: 74 (08/16 0600)  Labs:  Recent Labs  06/17/17 0253  06/18/17 0406 06/18/17 1600 06/19/17 0400  HGB 7.9*  --  8.4*  --  8.5*  HCT 22.6*  --  23.7*  --  24.5*  PLT 103*  --  115*  --  108*  LABPROT 19.8*  --  22.6*  --  20.4*  INR 1.66  --  1.95  --  1.72  HEPARINUNFRC 0.39  --  0.31  --  0.48  CREATININE 6.73*  < > 4.84* 3.43* 2.96*  < > = values in this interval not displayed.  Estimated Creatinine Clearance: 23.1 mL/min (A) (by C-G formula based on SCr of 2.96 mg/dL (H)).   Medical History: Past Medical History:  Diagnosis Date  . AICD (automatic cardioverter/defibrillator) present   . Asthma   . CHF (congestive heart failure) ([place])   . Diabetes ([place])   . Kidney disease   . Presence of permanent cardiac pacemaker    AICD  . Sleep apnea    Assessment: 71 y.o.malewith a history of CAD s/p CABG x 4 2010, OSA, AS with TAVR 2015, CKD, DM2, paroxysmal atrial fibrillation, asthma, and ischemic cardiomyopathy with Medtronic ICD.   INR 1.7 down today, hgb 8.5, heparin level continues to be at goal on 1300 units/hr. LDH ok and plt count low stable.  Goal of Therapy:  INR goal 2-2.5  Heparin Level 0.3-0.5 Monitor platelets by anticoagulation protocol: Yes   Plan:  -Continue heparin at 1300 units/hr -Monitor heparin level, CBC, S/Sx bleeding daily  Thank you,  Erin Hearing PharmD., BCPS Clinical Pharmacist Pager [phone] 06/19/2017 7:28 AM

## 2017-06-20 ENCOUNTER — Encounter (HOSPITAL_COMMUNITY): Payer: Medicare Other

## 2017-06-20 DIAGNOSIS — Z95811 Presence of heart assist device: Secondary | ICD-10-CM | POA: Diagnosis not present

## 2017-06-20 LAB — RENAL FUNCTION PANEL
ALBUMIN: 3.4 g/dL — AB (ref 3.5–5.0)
ALBUMIN: 3.4 g/dL — AB (ref 3.5–5.0)
Anion gap: 8 (ref 5–15)
Anion gap: 7 (ref 5–15)
BUN: 29 mg/dL — AB (ref 6–20)
BUN: 25 mg/dL — ABNORMAL HIGH (ref 6–20)
CALCIUM: 8.6 mg/dL — AB (ref 8.9–10.3)
CO2: 28 mmol/L (ref 22–32)
CO2: 26 mmol/L (ref 22–32)
CREATININE: 2.3 mg/dL — AB (ref 0.61–1.24)
Calcium: 8.3 mg/dL — ABNORMAL LOW (ref 8.9–10.3)
Chloride: 100 mmol/L — ABNORMAL LOW (ref 101–111)
Chloride: 102 mmol/L (ref 101–111)
Creatinine, Ser: 2.37 mg/dL — ABNORMAL HIGH (ref 0.61–1.24)
GFR calc Af Amer: 30 mL/min — ABNORMAL LOW (ref >60)
GFR calc non Af Amer: 26 mL/min — ABNORMAL LOW (ref >60)
GFR, EST AFRICAN AMERICAN: 31 mL/min — AB (ref >60)
GFR, EST NON AFRICAN AMERICAN: 27 mL/min — AB (ref >60)
GLUCOSE: 110 mg/dL — AB (ref 65–99)
Glucose, Bld: 260 mg/dL — ABNORMAL HIGH (ref 65–99)
PHOSPHORUS: 2 mg/dL — AB (ref 2.5–4.6)
PHOSPHORUS: 2.3 mg/dL — AB (ref 2.5–4.6)
POTASSIUM: 3.8 mmol/L (ref 3.5–5.1)
Potassium: 3.4 mmol/L — ABNORMAL LOW (ref 3.5–5.1)
SODIUM: 136 mmol/L (ref 135–145)
Sodium: 135 mmol/L (ref 135–145)

## 2017-06-20 LAB — GLUCOSE, CAPILLARY
GLUCOSE-CAPILLARY: 114 mg/dL — AB (ref 65–99)
GLUCOSE-CAPILLARY: 257 mg/dL — AB (ref 65–99)
Glucose-Capillary: 228 mg/dL — ABNORMAL HIGH (ref 65–99)
Glucose-Capillary: 245 mg/dL — ABNORMAL HIGH (ref 65–99)

## 2017-06-20 LAB — CBC
HEMATOCRIT: 25.9 % — AB (ref 39.0–52.0)
Hemoglobin: 8.7 g/dL — ABNORMAL LOW (ref 13.0–17.0)
MCH: 31.2 pg (ref 26.0–34.0)
MCHC: 33.6 g/dL (ref 30.0–36.0)
MCV: 92.8 fL (ref 78.0–100.0)
PLATELETS: 113 10*3/uL — AB (ref 150–400)
RBC: 2.79 MIL/uL — ABNORMAL LOW (ref 4.22–5.81)
RDW: 15.9 % — AB (ref 11.5–15.5)
WBC: 18.4 10*3/uL — ABNORMAL HIGH (ref 4.0–10.5)

## 2017-06-20 LAB — LACTATE DEHYDROGENASE: LDH: 297 U/L — AB (ref 98–192)

## 2017-06-20 LAB — MAGNESIUM: Magnesium: 2.3 mg/dL (ref 1.7–2.4)

## 2017-06-20 LAB — HEPARIN LEVEL (UNFRACTIONATED): Heparin Unfractionated: 0.5 IU/mL (ref 0.30–0.70)

## 2017-06-20 LAB — PROTIME-INR
INR: 1.43
PROTHROMBIN TIME: 17.6 s — AB (ref 11.4–15.2)

## 2017-06-20 MED ORDER — DEXTROSE 5 % IV SOLN
40.0000 meq | Freq: Once | INTRAVENOUS | Status: DC
  Filled 2017-06-20: qty 9.09

## 2017-06-20 MED ORDER — LIDOCAINE HCL (PF) 1 % IJ SOLN
INTRAMUSCULAR | Status: AC
  Filled 2017-06-20: qty 30

## 2017-06-20 MED ORDER — OXYCODONE HCL 5 MG PO TABS
5.0000 mg | ORAL_TABLET | Freq: Three times a day (TID) | ORAL | Status: DC | PRN
  Administered 2017-06-20 (×2): 10 mg via ORAL
  Administered 2017-07-01 (×2): 5 mg via ORAL
  Administered 2017-07-02 – 2017-07-04 (×4): 10 mg via ORAL
  Administered 2017-07-07: 5 mg via ORAL
  Filled 2017-06-20 (×3): qty 2
  Filled 2017-06-20 (×2): qty 1
  Filled 2017-06-20: qty 2
  Filled 2017-06-20: qty 1
  Filled 2017-06-20: qty 2

## 2017-06-20 MED ORDER — POTASSIUM PHOSPHATES 15 MMOLE/5ML IV SOLN
20.0000 meq | Freq: Once | INTRAVENOUS | Status: AC
  Administered 2017-06-20: 20 meq via INTRAVENOUS
  Filled 2017-06-20: qty 4.55

## 2017-06-20 NOTE — Progress Notes (Addendum)
CRRT stopped at 1700 per protocol for filter requirements. Will allow pt to rest tonight off CVVHD. MD to reassess future plan of Diaylsis for pt.

## 2017-06-20 NOTE — Progress Notes (Signed)
VAD coordinator paged with low PI's, will reduce goal on CRRT to -50.

## 2017-06-20 NOTE — Progress Notes (Signed)
Patient ID: Trevor [name], male   DOB: 1945/12/23, 71 y.o.   MRN: [id number]   Advanced Heart Failure VAD Team Note  Subjective:    Started on CVVHD 06/17/17 via temporary cath. Tolerating 60-90 cc of fluid removal currently.  Tired this am. Didn't sleep well overnight with bleeding from trialysis cath. Denies dizziness or lightheadedness. CVP now down to 6.  Weight down 5 lbs.   Hgb 8.6 -> 7.9 -> 7.9 -> 8.4 -> 8.7. No bleeding.    LDH 270 =>295 => 255 => 271 => 293 => 297 INR 2 -> 1.79 -> 1.66 => 1.95 => 1.43 WBC 13.3 -> 10.2 -> 11.0 -> 11.3 -> 18.4  RHC (8/10):  RA = 17 RV = 58/18 PA = 56/25 (37) PCW = 20 (v = 35) Fick cardiac output/index = 6.4/3.2 Thermo CO/CI = 4.1/2.1 PVR = 2.6 Ao sat = 98% PA sat = 56%, 60% Increased LVAD speed to 9600   PCW down to 19  LVAD INTERROGATION:  HeartMate II LVAD:  Flow 5.3 liters/min, speed 9600, power 6.0, PI 4.1. >10 PI events overnight.    Objective:    Vital Signs:   Temp:  [97.1 F (36.2 C)-98.1 F (36.7 C)] 98 F (36.7 C) (08/17 0757) Pulse Rate:  [44-91] 91 (08/16 2020) Resp:  [9-21] 12 (08/17 0700) BP: (89-136)/(72-97) 103/73 (08/17 0700) SpO2:  [95 %-99 %] 97 % (08/17 0400) Weight:  [185 lb 3 oz (84 kg)] 185 lb 3 oz (84 kg) (08/17 0500) Last BM Date: 06/19/17 Mean arterial Pressure Elevated in 90-100s range. > modified systolic  Intake/Output:   Intake/Output Summary (Last 24 hours) at 06/20/17 0900 Last data filed at 06/20/17 0700  Gross per 24 hour  Intake              927 ml  Output             4611 ml  Net            -3684 ml     Physical Exam    GENERAL: Elderly and fatigued appearing. NAD.  HEENT: Normal. NECK: Supple, JVP difficult, but does appears ~ 6-7 cm. Carotids OK.  Left subclavian trialysis cath. Dressing currently C/D/I.  CARDIAC:  Mechanical heart sounds with LVAD hum present.  LUNGS:  CTAB, normal effort.  ABDOMEN:  NT, ND, no HSM. No bruits or masses. +BS  LVAD exit site: Well-healed and  incorporated. Dressing dry and intact. No erythema or drainage. Stabilization device present and accurately applied. Driveline dressing changed daily per sterile technique. EXTREMITIES:  Warm and dry. No cyanosis, clubbing, rash, or edema.  NEUROLOGIC:  Alert & oriented x 3. Cranial nerves grossly intact. Moves all 4 extremities w/o difficulty. Affect pleasant     Telemetry   Personally reviewed, Afib 80-90s      Labs   Basic Metabolic Panel:  Recent Labs Lab 06/18/17 0406 06/18/17 1600 06/19/17 0400 06/19/17 1630 06/20/17 0400 06/20/17 0401  NA 126* 133* 132* 134*  --  136  K 3.6 3.4* 3.4* 3.5  --  3.4*  CL 89* 99* 96* 99*  --  100*  CO2 24 24 27 27   --  28  GLUCOSE 218* 216* 231* 157*  --  110*  BUN 74* 52* 42* 32*  --  29*  CREATININE 4.84* 3.43* 2.96* 2.50*  --  2.37*  CALCIUM 8.1* 7.7* 8.5* 8.5*  --  8.6*  MG 1.8  --  2.0  --  2.3  --  PHOS 5.5* 3.2 2.4* 3.0  --  2.0*    Liver Function Tests:  Recent Labs Lab 06/13/17 0931 06/14/17 0255  06/18/17 0406 06/18/17 1600 06/19/17 0400 06/19/17 1630 06/20/17 0401  AST 28 21  --   --   --   --   --   --   ALT 23 19  --   --   --   --   --   --   ALKPHOS 102 88  --   --   --   --   --   --   BILITOT 1.1 0.8  --   --   --   --   --   --   PROT 7.2 6.5  --   --   --   --   --   --   ALBUMIN 3.8 3.3*  < > 3.7 3.3* 3.7 3.5 3.4*  < > = values in this interval not displayed. No results for input(s): LIPASE, AMYLASE in the last 168 hours. No results for input(s): AMMONIA in the last 168 hours.  CBC:  Recent Labs Lab 06/16/17 0541 06/17/17 0253 06/18/17 0406 06/19/17 0400 06/20/17 0400  WBC 10.2 11.0* 11.3* 17.5* 18.4*  HGB 7.9* 7.9* 8.4* 8.5* 8.7*  HCT 22.1* 22.6* 23.7* 24.5* 25.9*  MCV 88.8 85.9 87.1 90.1 92.8  PLT 98* 103* 115* 108* 113*    INR:  Recent Labs Lab 06/16/17 0541 06/17/17 0253 06/18/17 0406 06/19/17 0400 06/20/17 0400  INR 1.79 1.66 1.95 1.72 1.43    Other results:  EKG:     Imaging   No results found.   Medications:     Scheduled Medications: . amLODipine  5 mg Oral Daily  . atorvastatin  40 mg Oral Daily  . budesonide  0.25 mg Nebulization BID  . busPIRone  5 mg Oral BID  . Chlorhexidine Gluconate Cloth  6 each Topical Daily  . darbepoetin (ARANESP) injection - NON-DIALYSIS  150 mcg Subcutaneous Q Fri-1800  . docusate sodium  100 mg Oral BID  . gabapentin  300 mg Oral QHS  . hydrALAZINE  37.5 mg Oral Q8H  . insulin aspart  0-15 Units Subcutaneous TID WC  . insulin aspart  0-5 Units Subcutaneous QHS  . insulin glargine  20 Units Subcutaneous QHS  . levothyroxine  25 mcg Oral QAC breakfast  . multivitamin with minerals  1 tablet Oral Q1200  . neomycin-bacitracin-polymyxin   Topical Daily  . pantoprazole  40 mg Oral Q1200  . rOPINIRole  0.25 mg Oral QHS  . sildenafil  40 mg Oral TID  . sodium chloride flush  10-40 mL Intracatheter Q12H  . sodium chloride flush  3 mL Intravenous Q12H  . ascorbic acid  1,000 mg Oral Q1200    Infusions: . sodium chloride    . sodium chloride    . heparin 1,300 Units/hr (06/19/17 2346)  . heparin    . potassium PHOSPHATE IVPB (mEq)    . dialysis replacement fluid (prismasate) 500 mL/hr at 06/20/17 0412  . dialysis replacement fluid (prismasate) 300 mL/hr at 06/19/17 1856  . dialysate (PRISMASATE) 1,000 mL/hr at 06/20/17 0501    PRN Medications: sodium chloride, acetaminophen, albuterol, heparin, heparin, ondansetron (ZOFRAN) IV, oxyCODONE, sodium chloride flush, sodium chloride flush, traMADol, traZODone   Patient Profile   Trevor Gahm Murphyis a 71 y.o.malewith a history of CAD s/p CABG x 4 2010, OSA, AS with TAVR 2015, CKD, DM2, paroxysmal atrial fibrillation, asthma, and ischemic cardiomyopathy with  Medtronic ICD.   He admitted with marked volume overload and AKI despite with increase in outpatient diuretics.    Assessment/Plan:    1. Acute on chronic systolic CHF: Ischemic cardiomyopathy,  EF 20-25% with RV dysfunction on 2/18 echo pre-LVAD. 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 Medtronic ICD. Overall, he has felt much better post-LVAD. However, he developed AKI and was eventually admitted in 5/18 after RHC showed elevated filling pressures. He was diuresed and creatinine came down. Since then, we have adjusted up and down his torsemide dose, with higher dose volume improves but creatinine worsens. With lower dose we can make creatinine look better but he gets volume overloaded.  He has again developed worsening volume overload with AKI, creatinine up to 5.79.  He had RHC on 8/10 as above and milrinone 0.125 started, increased to 0.25 on 8/11.  Speed increased to 9600 on 8/10.  - Weight down another 5 lbs. CRRT up to ~ 200 cc/hr last night. PIs dropped, so now backed down to 50-100 cc range. CVP much improved, down to 6 this am. Creatinine down to 2.37.  - Suspect significant RV dysfunction is making volume management difficult.  - Continue CRRT at least for this am. Tentatively plan to stop and follow response today.  Appreciate Nephrology support.  - No ASA with h/o GI bleeding => hold warfarin for now in case procedure needed, cover with heparin gtt while INR subtherapeutic.  2. CAD: s/p CABG. Denies CP. Continue atorvastatin. No change.    3. AKI on CKD stage IV:  - Continue to follow CRRT. Appreciate Nephrology support. Possibly stopping today to follow response.  4. Atrial fibrillation: Chronic. Warfarin held for now, on heparin gtt.  - Rate stable. No change.  5. RV failure: Continue Revatio 40 mg TID.  Now off milrinone.  - Continue CRRT.  6. HTN:  - MAPs OK. Continue amlodipine to 5 mg daily and continue to follow.  7. Anemia: Baseline anemia of renal disease/chronic disease but recent bleeding from colonic AVMs. No overt bleeding, may be primarily anemia of renal disease at this point. FOBT negative. Got 2 units PRBCs with appropriate rise in Hgb.  - Stable at 8.7 this am  despite bleeding from trialysis cath. Dr. Prescott Gum to place stitch this am.  8. 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  No overt bleeding, may be related to anemia of renal disease. FOBT negative this admission.  - Continue octreotide injections. No change. Hgb stable as above.  9. Skin cancer: On neck and RLE. Needs excision, does not have to stop warfarin. Will address further as outpatient. No change.  - WOC have assessed biopsy sites  10. Possible left wrist gout flare:  - Improved with short course prednisone.  11. Hypokalemia - Per renal with CVVHD.   I reviewed the LVAD parameters from today, and compared the results to the patient's prior recorded data.  No programming changes were made.  The LVAD is functioning within specified parameters.  The patient performs LVAD self-test daily.  LVAD interrogation was negative for any significant power changes, alarms or PI events/speed drops.  LVAD equipment check completed and is in good working order.  Back-up equipment present.   LVAD education done on emergency procedures and precautions and reviewed exit site care.  Length of Stay: [place] ZOX[place]UE, [place] 06/20/2017, 9:00 AM  VAD Team --- VAD ISSUES ONLY--- Pager [phone] (7am - 7am)  Advanced Heart Failure Team  Pager  [phone] (M-F; 7a - 4p)  Please contact [facility] for night-coverage after hours (4p -7a ) and weekends on [email or web address]   Patient seen and examined with the above-signed Advanced Practice Provider and/or Housestaff. I personally reviewed laboratory data, imaging studies and relevant notes. I independently examined the patient and formulated the important aspects of the plan. I have edited the note to reflect any of my changes or salient points. I have personally discussed the plan with the patient and/or family.  Responding very well to CVVHD. CVP now 6. Having multiple PI events. Will run him even on the pump. D/w Renal will  try him on iHD soon. Will need to work on getting him set up with outpatient dialysis options.   Bleeding from cath site improved after site stitched. Hgb currently stable.   Glori Bickers, MD  3:01 PM

## 2017-06-20 NOTE — Progress Notes (Signed)
ANTICOAGULATION CONSULT NOTE  Pharmacy Consult for heparin Indication: LVAD  No Known Allergies  Patient Measurements: Height: 5\' 5"  (165.1 cm) Weight: 185 lb 3 oz (84 kg) IBW/kg (Calculated) : 61.5 Heparin Dosing Weight = 80.7 kg  Vital Signs: Temp: 98 F (36.7 C) (08/17 0757) Temp Source: Oral (08/17 0757) BP: 103/73 (08/17 0700)  Labs:  Recent Labs  06/18/17 0406  06/19/17 0400 06/19/17 1630 06/20/17 0400 06/20/17 0401  HGB 8.4*  --  8.5*  --  8.7*  --   HCT 23.7*  --  24.5*  --  25.9*  --   PLT 115*  --  108*  --  113*  --   LABPROT 22.6*  --  20.4*  --  17.6*  --   INR 1.95  --  1.72  --  1.43  --   HEPARINUNFRC 0.31  --  0.48  --   --  0.50  CREATININE 4.84*  < > 2.96* 2.50*  --  2.37*  < > = values in this interval not displayed.  Estimated Creatinine Clearance: 28.5 mL/min (A) (by C-G formula based on SCr of 2.37 mg/dL (H)).   Medical History: Past Medical History:  Diagnosis Date  . AICD (automatic cardioverter/defibrillator) present   . Asthma   . CHF (congestive heart failure) ([place])   . Diabetes ([place])   . Kidney disease   . Presence of permanent cardiac pacemaker    AICD  . Sleep apnea    Assessment: 70 y.o.malewith a history of CAD s/p CABG x 4 2010, OSA, AS with TAVR 2015, CKD, DM2, paroxysmal atrial fibrillation, asthma, and ischemic cardiomyopathy with Medtronic ICD.   INR 1.43 down today, hgb 8.7, heparin level continues to be at goal on 1300 units/hr. LDH ok and plt count low stable.  Goal of Therapy:  INR goal 2-2.5  Heparin Level 0.3-0.5 Monitor platelets by anticoagulation protocol: Yes   Plan:  -Continue heparin at 1300 units/hr. -Monitor heparin level, CBC, S/Sx bleeding daily. -F/u plans to resume Coumadin eventually.  Uvaldo Rising, BCPS  Clinical Pharmacist Pager [phone]  06/20/2017 9:07 AM

## 2017-06-20 NOTE — Progress Notes (Signed)
Patient ID: Trevor [name], male   DOB: 1946-02-01, 71 y.o.   MRN: [id number] S:No complaints. Breathing overall improving, still with some orthopnea. Feels much better on CRRT.   Interval updates: Tolerating CRRT however UF reduced to 50 mls/hr by VAD coordinator 2/2 low PIs for a few hours overnight. Had lots of bleeding from HD cath, was repaired overnight.  Neg 3.8L from CRRT and UOP ~1L yesterday without lasix. Down 16.5 L since admit.  Weight 177.5lbsl, down 5 lbs from yesterday. Dry weight ~175 BP remain stable off milrinone.  CVP 6.  O:BP 103/73   Pulse 91   Temp 98 F (36.7 C) (Oral)   Resp 12   Ht 5\' 5"  (1.651 m)   Wt 185 lb 3 oz (84 kg)   SpO2 97%   BMI 30.82 kg/m     Intake/Output Summary (Last 24 hours) at 06/20/17 0808 Last data filed at 06/20/17 0700  Gross per 24 hour  Intake              940 ml  Output             4680 ml  Net            -3740 ml   Intake/Output: I/O last 3 completed shifts: In: 4944 [P.O.:570; I.V.:455; IV Piggyback:301] Out: 7061 [Urine:1525; HQPRF:1638; Stool:1]  Intake/Output this shift:  No intake/output data recorded. Weight change: -5 lb 5 oz (-2.41 kg)   Filed Weights   06/18/17 0400 06/19/17 0420 06/20/17 0500  Weight: 196 lb 3.2 oz (89 kg) 190 lb 8 oz (86.4 kg) 185 lb 3 oz (84 kg)   Gen:NAD, very pleasant but looks very tired GYK:[id number] hum around precordium Resp:decreased BS at bases, otherwise clear.  Abd:+BS, not tender. Driveline dressing intact, clean.  Ext: Edema improved, still some dependent edema. HD Access: Temp HD cath placed 8/14   Recent Labs Lab 06/13/17 0931  06/14/17 0255  06/17/17 0253 06/17/17 1502 06/18/17 0406 06/18/17 1600 06/19/17 0400 06/19/17 1630 06/20/17 0401  NA 133*  < > 130*  < > 125* 124* 126* 133* 132* 134* 136  K 4.3  < > 3.6  < > 3.7 3.5 3.6 3.4* 3.4* 3.5 3.4*  CL 96*  < > 94*  < > 86* 85* 89* 99* 96* 99* 100*  CO2 22  < > 22  < > 23 23 24 24 27 27 28   GLUCOSE 162*  < > 159*   < > 221* 155* 218* 216* 231* 157* 110*  BUN 91*  < > 92*  < > 102* 107* 74* 52* 42* 32* 29*  CREATININE 5.80*  < > 5.79*  < > 6.73* 6.74* 4.84* 3.43* 2.96* 2.50* 2.37*  ALBUMIN 3.8  --  3.3*  < > 3.4* 3.5 3.7 3.3* 3.7 3.5 3.4*  CALCIUM 8.2*  < > 8.1*  < > 7.9* 7.9* 8.1* 7.7* 8.5* 8.5* 8.6*  PHOS  --   --   --   < > 7.7* 8.0* 5.5* 3.2 2.4* 3.0 2.0*  AST 28  --  21  --   --   --   --   --   --   --   --   ALT 23  --  19  --   --   --   --   --   --   --   --   < > = values in this interval not displayed. Liver Function Tests:  Recent  Labs Lab 06/13/17 0931 06/14/17 0255  06/19/17 0400 06/19/17 1630 06/20/17 0401  AST 28 21  --   --   --   --   ALT 23 19  --   --   --   --   ALKPHOS 102 88  --   --   --   --   BILITOT 1.1 0.8  --   --   --   --   PROT 7.2 6.5  --   --   --   --   ALBUMIN 3.8 3.3*  < > 3.7 3.5 3.4*  < > = values in this interval not displayed. No results for input(s): LIPASE, AMYLASE in the last 168 hours. No results for input(s): AMMONIA in the last 168 hours. CBC:  Recent Labs Lab 06/16/17 0541 06/17/17 0253 06/18/17 0406 06/19/17 0400 06/20/17 0400  WBC 10.2 11.0* 11.3* 17.5* 18.4*  HGB 7.9* 7.9* 8.4* 8.5* 8.7*  HCT 22.1* 22.6* 23.7* 24.5* 25.9*  MCV 88.8 85.9 87.1 90.1 92.8  PLT 98* 103* 115* 108* 113*  CBG:  Recent Labs Lab 06/18/17 2158 06/19/17 0844 06/19/17 1217 06/19/17 2145 06/20/17 0753  GLUCAP 319* 116* 148* 241* 114*   Iron Studies:  Iron/TIBC/Ferritin/ %Sat    Component Value Date/Time   IRON 125 06/14/2017 1605   TIBC 221 (L) 06/14/2017 1605   FERRITIN 622 (H) 06/14/2017 1605   IRONPCTSAT 57 (H) 06/14/2017 1605   Studies/Results: Result Date: 06/17/2017  EXAM: NON-TUNNELED CENTRAL VENOUS HEMODIALYSIS CATHETER PLACEMENT WITH ULTRASOUND AND FLUOROSCOPIC GUIDANCE COMPARISON:  IMPRESSION: Successful placement of a left internal jugular approach 20 cm temporary dialysis catheter with tip terminating with in the superior aspect of  the right atrium. The catheter is ready for immediate use. PLAN: This catheter may be converted to a tunneled dialysis catheter at a later date as indicated. Electronically Signed   By: Sandi Mariscal M.D.   On: 06/17/2017 15:21   . amLODipine  5 mg Oral Daily  . atorvastatin  40 mg Oral Daily  . budesonide  0.25 mg Nebulization BID  . busPIRone  5 mg Oral BID  . Chlorhexidine Gluconate Cloth  6 each Topical Daily  . darbepoetin (ARANESP) injection - NON-DIALYSIS  150 mcg Subcutaneous Q Fri-1800  . docusate sodium  100 mg Oral BID  . gabapentin  300 mg Oral QHS  . hydrALAZINE  37.5 mg Oral Q8H  . insulin aspart  0-15 Units Subcutaneous TID WC  . insulin aspart  0-5 Units Subcutaneous QHS  . insulin glargine  20 Units Subcutaneous QHS  . levothyroxine  25 mcg Oral QAC breakfast  . multivitamin with minerals  1 tablet Oral Q1200  . neomycin-bacitracin-polymyxin   Topical Daily  . pantoprazole  40 mg Oral Q1200  . rOPINIRole  0.25 mg Oral QHS  . sildenafil  40 mg Oral TID  . sodium chloride flush  10-40 mL Intracatheter Q12H  . sodium chloride flush  3 mL Intravenous Q12H  . ascorbic acid  1,000 mg Oral Q1200   CMP Latest Ref Rng & Units 06/20/2017 06/19/2017 06/19/2017  Glucose 65 - 99 mg/dL 110(H) 157(H) 231(H)  BUN 6 - 20 mg/dL 29(H) 32(H) 42(H)  Creatinine 0.61 - 1.24 mg/dL 2.37(H) 2.50(H) 2.96(H)  Sodium 135 - 145 mmol/L 136 134(L) 132(L)  Potassium 3.5 - 5.1 mmol/L 3.4(L) 3.5 3.4(L)  Chloride 101 - 111 mmol/L 100(L) 99(L) 96(L)  CO2 22 - 32 mmol/L 28 27 27   Calcium 8.9 - 10.3  mg/dL 8.6(L) 8.5(L) 8.5(L)  Total Protein 6.5 - 8.1 g/dL - - -  Total Bilirubin 0.3 - 1.2 mg/dL - - -  Alkaline Phos 38 - 126 U/L - - -  AST 15 - 41 U/L - - -  ALT 17 - 63 U/L - - -   CBC Latest Ref Rng & Units 06/20/2017 06/19/2017 06/18/2017  WBC 4.0 - 10.5 K/uL 18.4(H) 17.5(H) 11.3(H)  Hemoglobin 13.0 - 17.0 g/dL 8.7(L) 8.5(L) 8.4(L)  Hematocrit 39.0 - 52.0 % 25.9(L) 24.5(L) 23.7(L)  Platelets 150 - 400  K/uL 113(L) 108(L) 115(L)   Assessment/Plan:  1. AKI/CKD stage 4 in setting of decompensated CHF/cardiorenal syndrome: Doing well on CRRT via temp HD cath. Started 8/14. ~3.8L out from CRRT with 1L UOP OFF LASIX. CVP low and pt near dry weight 1. Transition off CRRT today 2. Add lasix IV 80mg  BID 3. Appreciate assistance from HF with obtaining protocol re: LVAD + HD  4. Recommend AVG vs AVF placement as shorter maturation time, IF able to tolerate IHD & outpatient HD can be arranged.  2. Acute on chronic systolic CHF/ischemic Cardiomyopathy: EF 20-25% s/p LVAD placement as well as right heart failure seen on RHC 06/13/17. OFF Milrinone today, BP low-normal.  1. Per HF 3. Hyperphosphatemia: Started on Renvela 800mg  TID WC 8/12. 2.4 yest and Renvela was d/c. Phos replaced with 60mEq Sodium Phos yesterday... 1. Replace K (3.4 today) and phos (2) with 20 mEq K-Phos  4. A fib on Heparin 1. On warfarin at home. Hold currently 5. RV failure- as above, Off milrinone.  6. Hypotension: OK off support 7. Disposition- doing OK so far on trial of CRRT. This remains a difficult situation due to his cardiac status, presence of LVAD and poor response with IV diuretics. He understands that if he does not tolerate this that there is nothing else to be offered.    Einar Gip, DO PGY-2 [facility] [phone]  I have seen and examined this patient and agree with plan and assessment in the above note with renal recommendations/intervention highlighted.  CVP has improved to 6 and will stop CVVHDF later today and attempt IHD as his BP's have markedly improved with volume removal.  Will likely try tomorrow if he has significant bump in his weight off of CVVHDF.  Will restart IV Lasix in hopes that his renal function will improve now that his CHF has improved.  Governor Rooks Vannia Pola,MD 06/20/2017 3:59 PM

## 2017-06-20 NOTE — Progress Notes (Addendum)
LVAD Coordinator Rounding Note:  Admitted 06/13/17 due to RV failure, marked volume overload.   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 Darcey Nora under Destination Therapy criteria.  Nurse reports bleeding at temp dialysis cath. Dressing and bed linens saturated with bloody drainage; new dressing placed. Pt is sitting in chair.  Dr. Darcey Nora at bedside to place additional sutures to temp dialysis catheter site.    Vital signs: HR: 77 Doppler Pressure: not done Automatic BP: 112/89 (97) O2 Sat: 100% on RA Wt in lbs: 197>197>199>199>196>190>185   LVAD interrogation reveals:   Speed:9600 Flow: 5.3 Power:  6.1w PI: 5.3 Alarms: none Events:  12 PI events 06/20/17; 4 PI events on 06/19/17 Fixed speed: 9600 Low speed limit: 9000  Drive Line: Sorbaview dressing dry and intact; needs weekly dressing changes. Next dressing change is due 8/20.  Labs:  LDH trend: 317>270>295>255>271>293>304>297  INR trend: 2.15>2.3>2.04>1.94>1.79>1.66>1.95>1.72>1.43  Creat trend:  4.79>5.80>5.73>5.79>6.08>6.56>6.73>6.74>4.84>3.43>2.96>2.50>2.37  Hgb trend: 7.4>6.5>8.6>7.9>7.9>8.4>8.5>8.7  WBC trend: 10.7>13.3>11.0>11.3>17.5>18.4  Blood Transfusion: 06/14/17 - 2 units PCs  Renal: -temporary dialysis catheter placed 06/17/17 -CVVH initiated 06/17/17   Gtts: Heparin 1300 u/hr  Anticoagulation Plan: -INR Goal:  2.0 - 2.5 - warfarin on hold -ASA Dose: 81 mg daily - on hold  Device: -Medtronic single lead -Therapies: on   Plan/Recommendations:   1. Continue CRRT. Had drop in PI to 1.5 - 2.0 early am this morning. Stopped pulling fluid until PI back to 5.0 - now pulling      100 cc/hr 2. Call VAD pager if any VAD equipment issues at [phone].  [name] RN, BSN  VAD Coordinator [phone] 24/7 pager

## 2017-06-21 LAB — RENAL FUNCTION PANEL
ALBUMIN: 3.2 g/dL — AB (ref 3.5–5.0)
ANION GAP: 8 (ref 5–15)
BUN: 38 mg/dL — ABNORMAL HIGH (ref 6–20)
CALCIUM: 8.5 mg/dL — AB (ref 8.9–10.3)
CO2: 26 mmol/L (ref 22–32)
CREATININE: 3.36 mg/dL — AB (ref 0.61–1.24)
Chloride: 101 mmol/L (ref 101–111)
GFR, EST AFRICAN AMERICAN: 20 mL/min — AB (ref >60)
GFR, EST NON AFRICAN AMERICAN: 17 mL/min — AB (ref >60)
Glucose, Bld: 93 mg/dL (ref 65–99)
PHOSPHORUS: 2.9 mg/dL (ref 2.5–4.6)
Potassium: 3.4 mmol/L — ABNORMAL LOW (ref 3.5–5.1)
Sodium: 135 mmol/L (ref 135–145)

## 2017-06-21 LAB — PROTIME-INR
INR: 1.31
Prothrombin Time: 16.4 s — ABNORMAL HIGH (ref 11.4–15.2)

## 2017-06-21 LAB — GLUCOSE, CAPILLARY
Glucose-Capillary: 61 mg/dL — ABNORMAL LOW (ref 65–99)
Glucose-Capillary: 62 mg/dL — ABNORMAL LOW (ref 65–99)
Glucose-Capillary: 82 mg/dL (ref 65–99)
Glucose-Capillary: 162 mg/dL — ABNORMAL HIGH (ref 65–99)
Glucose-Capillary: 175 mg/dL — ABNORMAL HIGH (ref 65–99)
Glucose-Capillary: 242 mg/dL — ABNORMAL HIGH (ref 65–99)

## 2017-06-21 LAB — CBC
HCT: 24 % — ABNORMAL LOW (ref 39.0–52.0)
Hemoglobin: 7.9 g/dL — ABNORMAL LOW (ref 13.0–17.0)
MCH: 31.3 pg (ref 26.0–34.0)
MCHC: 32.9 g/dL (ref 30.0–36.0)
MCV: 95.2 fL (ref 78.0–100.0)
Platelets: 92 K/uL — ABNORMAL LOW (ref 150–400)
RBC: 2.52 MIL/uL — ABNORMAL LOW (ref 4.22–5.81)
RDW: 16.5 % — ABNORMAL HIGH (ref 11.5–15.5)
WBC: 14.7 K/uL — ABNORMAL HIGH (ref 4.0–10.5)

## 2017-06-21 LAB — HEPARIN LEVEL (UNFRACTIONATED): Heparin Unfractionated: 0.5 [IU]/mL (ref 0.30–0.70)

## 2017-06-21 LAB — LACTATE DEHYDROGENASE: LDH: 278 U/L — ABNORMAL HIGH (ref 98–192)

## 2017-06-21 LAB — MAGNESIUM: Magnesium: 2.2 mg/dL (ref 1.7–2.4)

## 2017-06-21 MED ORDER — POTASSIUM CHLORIDE CRYS ER 20 MEQ PO TBCR
30.0000 meq | EXTENDED_RELEASE_TABLET | ORAL | Status: AC
  Administered 2017-06-21 (×2): 30 meq via ORAL
  Filled 2017-06-21 (×2): qty 1

## 2017-06-21 MED ORDER — FUROSEMIDE 10 MG/ML IJ SOLN
160.0000 mg | Freq: Two times a day (BID) | INTRAVENOUS | Status: DC
  Administered 2017-06-21 – 2017-06-23 (×5): 160 mg via INTRAVENOUS
  Filled 2017-06-21: qty 16
  Filled 2017-06-21: qty 10
  Filled 2017-06-21: qty 16
  Filled 2017-06-21: qty 10
  Filled 2017-06-21: qty 16

## 2017-06-21 NOTE — Progress Notes (Signed)
Patient ID: Trevor [name], male   DOB: 1945/12/31, 71 y.o.   MRN: [id number]   Advanced Heart Failure VAD Team Note  Subjective:    Started on CVVHD 06/17/17 via temporary cath.   CVVHD stopped 8/17. Weight up 4 pounds. CVP 10. Cr 2.3 -> 3.3.  Feels ok. Denies SOB. Blood sugar 61 this am but now improved.    Studies:  RHC (06/13/17):  RA = 17 RV = 58/18 PA = 56/25 (37) PCW = 20 (v = 35) Fick cardiac output/index = 6.4/3.2 Thermo CO/CI = 4.1/2.1 PVR = 2.6 Ao sat = 98% PA sat = 56%, 60% Increased LVAD speed to 9600   PCW down to 19  LVAD INTERROGATION:  HeartMate II LVAD:  Flow 5.9 liters/min, speed 9600, power 7.0, PI 3.6. Occasional PI event  Objective:    Vital Signs:   Temp:  [98.4 F (36.9 C)-98.8 F (37.1 C)] 98.4 F (36.9 C) (08/18 0813) Resp:  [11-23] 13 (08/18 0800) BP: (51-109)/(36-98) 89/80 (08/18 1000) SpO2:  [95 %-99 %] 95 % (08/18 0900) Weight:  [82.1 kg (181 lb 1.2 oz)-82.6 kg (182 lb)] 82.6 kg (182 lb) (08/18 0843) Last BM Date: 06/20/17 Mean arterial Pressure 75-90  Intake/Output:   Intake/Output Summary (Last 24 hours) at 06/21/17 1105 Last data filed at 06/21/17 1000  Gross per 24 hour  Intake             1379 ml  Output              761 ml  Net              618 ml     Physical Exam    GENERAL: sitting in chair NAD.  HEENT: Normal. Anicteric NECK: Supple, JVP jaw Carotids OK.  Left subclavian trialysis cath. Dressing currently C/D/I.  CARDIAC:  Mechanical heart sounds with LVAD hum  LUNGS:  clear ABDOMEN:  Soft NT/ND NT, ND  LVAD exit site: Well-healed and incorporated. Dressing dry and intact. No erythema or drainage. Stabilization device present and accurately applied. Driveline dressing changed daily per sterile technique. No signs infection  Extremities: no cyanosis, clubbing, rash, edema Neuro: alert & oriented x 3, cranial nerves grossly intact. moves all 4 extremities w/o difficulty. Affect pleasant  Telemetry   Personally  reviewed, Afib 65-75   Labs   Basic Metabolic Panel:  Recent Labs Lab 06/18/17 0406  06/19/17 0400 06/19/17 1630 06/20/17 0400 06/20/17 0401 06/20/17 1729 06/21/17 0430  NA 126*  < > 132* 134*  --  136 135 135  K 3.6  < > 3.4* 3.5  --  3.4* 3.8 3.4*  CL 89*  < > 96* 99*  --  100* 102 101  CO2 24  < > 27 27  --  28 26 26   GLUCOSE 218*  < > 231* 157*  --  110* 260* 93  BUN 74*  < > 42* 32*  --  29* 25* 38*  CREATININE 4.84*  < > 2.96* 2.50*  --  2.37* 2.30* 3.36*  CALCIUM 8.1*  < > 8.5* 8.5*  --  8.6* 8.3* 8.5*  MG 1.8  --  2.0  --  2.3  --   --  2.2  PHOS 5.5*  < > 2.4* 3.0  --  2.0* 2.3* 2.9  < > = values in this interval not displayed.  Liver Function Tests:  Recent Labs Lab 06/19/17 0400 06/19/17 1630 06/20/17 0401 06/20/17 1729 06/21/17 0430  ALBUMIN 3.7  3.5 3.4* 3.4* 3.2*   No results for input(s): LIPASE, AMYLASE in the last 168 hours. No results for input(s): AMMONIA in the last 168 hours.  CBC:  Recent Labs Lab 06/17/17 0253 06/18/17 0406 06/19/17 0400 06/20/17 0400 06/21/17 0430  WBC 11.0* 11.3* 17.5* 18.4* 14.7*  HGB 7.9* 8.4* 8.5* 8.7* 7.9*  HCT 22.6* 23.7* 24.5* 25.9* 24.0*  MCV 85.9 87.1 90.1 92.8 95.2  PLT 103* 115* 108* 113* 92*    INR:  Recent Labs Lab 06/17/17 0253 06/18/17 0406 06/19/17 0400 06/20/17 0400 06/21/17 0430  INR 1.66 1.95 1.72 1.43 1.31    Other results:  EKG:    Imaging   No results found.   Medications:     Scheduled Medications: . amLODipine  5 mg Oral Daily  . atorvastatin  40 mg Oral Daily  . budesonide  0.25 mg Nebulization BID  . busPIRone  5 mg Oral BID  . Chlorhexidine Gluconate Cloth  6 each Topical Daily  . darbepoetin (ARANESP) injection - NON-DIALYSIS  150 mcg Subcutaneous Q Fri-1800  . docusate sodium  100 mg Oral BID  . gabapentin  300 mg Oral QHS  . hydrALAZINE  37.5 mg Oral Q8H  . insulin aspart  0-15 Units Subcutaneous TID WC  . insulin aspart  0-5 Units Subcutaneous QHS  .  insulin glargine  20 Units Subcutaneous QHS  . levothyroxine  25 mcg Oral QAC breakfast  . multivitamin with minerals  1 tablet Oral Q1200  . neomycin-bacitracin-polymyxin   Topical Daily  . pantoprazole  40 mg Oral Q1200  . potassium chloride  30 mEq Oral Q4H  . rOPINIRole  0.25 mg Oral QHS  . sildenafil  40 mg Oral TID  . sodium chloride flush  10-40 mL Intracatheter Q12H  . sodium chloride flush  3 mL Intravenous Q12H  . ascorbic acid  1,000 mg Oral Q1200    Infusions: . sodium chloride    . sodium chloride    . furosemide 160 mg (06/21/17 1042)  . heparin 1,300 Units/hr (06/21/17 1000)  . heparin    . dialysis replacement fluid (prismasate) 500 mL/hr at 06/20/17 1520  . dialysis replacement fluid (prismasate) 300 mL/hr at 06/20/17 0800  . dialysate (PRISMASATE) 1,000 mL/hr at 06/20/17 1520    PRN Medications: sodium chloride, acetaminophen, albuterol, heparin, heparin, ondansetron (ZOFRAN) IV, oxyCODONE, sodium chloride flush, sodium chloride flush, traMADol, traZODone   Patient Profile   Trevor [name] a history of CAD s/p CABG x 4 2010, OSA, AS with TAVR 2015, CKD, DM2, paroxysmal atrial fibrillation, asthma, and ischemic cardiomyopathy with Medtronic ICD.   He admitted with marked volume overload and AKI despite with increase in outpatient diuretics.    Assessment/Plan:    1. Acute on chronic systolic CHF: Ischemic cardiomyopathy, EF 20-25% with RV dysfunction on 2/18 echo pre-LVAD. 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 Medtronic ICD. Overall, he has felt much better post-LVAD. However, he developed AKI and was eventually admitted in 5/18 after RHC showed elevated filling pressures. He was diuresed and creatinine came down. Since then, we have adjusted up and down his torsemide dose, with higher dose volume improves but creatinine worsens. With lower dose we can make creatinine look better but he gets volume overloaded.  He has again developed  worsening volume overload with AKI, creatinine up to 5.79.  He had RHC on 8/10 as above and milrinone 0.125 started, increased to 0.25 on 8/11.  Speed increased to 9600 on 8/10.  -  Main issue is severe RV failure - CVVHD stopped 8/17. Volume status going back. CVP 10 - Discussed with Dr. Marval Regal. Continue to hold CVVHD. Start IV lasix  - No ASA with h/o GI bleeding => hold warfarin for now in case procedure needed, cover with heparin gtt while INR subtherapeutic. D/w PharmD today.  2. CAD: s/p CABG. Denies CP. Continue atorvastatin. No change.    3. AKI on CKD stage IV:  - CVVHD stopped 8/17. Volume status going back. CVP 10 - Discussed with Dr. Marval Regal. Continue to hold CVVHD. Start IV lasix  - Creatinine 2.3->3.3. Continue to follow trend  4. Atrial fibrillation: Chronic. Warfarin held for now, on heparin gtt.  - Rate stable. No change.  5. RV failure: Continue Revatio 40 mg TID.  Now off milrinone.  - Management of volume status as above 6. HTN:  - MAPs 75-90 range. Continue amlodipine to 5 mg daily and continue to follow.  7. Anemia: Baseline anemia of renal disease/chronic disease but recent bleeding from colonic AVMs. No overt bleeding, may be primarily anemia of renal disease at this point. FOBT negative. Got 2 units PRBCs with appropriate rise in Hgb.  - Hgb down to 7.9. Bleeding from Trialysis cath has resolved with suturing of cath site. Will follow H/H. May need blood in am.  8. 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  No overt bleeding, may be related to anemia of renal disease. FOBT negative this admission.  - Continue octreotide injections. 9. Skin cancer: On neck and RLE. Needs excision, does not have to stop warfarin. Will address further as outpatient. No change.  - WOC have assessed biopsy sites    I reviewed the LVAD parameters from today, and compared the results to the patient's prior recorded data.  No programming changes were  made.  The LVAD is functioning within specified parameters.  The patient performs LVAD self-test daily.  LVAD interrogation was negative for any significant power changes, alarms or PI events/speed drops.  LVAD equipment check completed and is in good working order.  Back-up equipment present.   LVAD education done on emergency procedures and precautions and reviewed exit site care.  Length of Stay: 8  Glori Bickers, MD 06/21/2017, 11:05 AM  VAD Team --- VAD ISSUES ONLY--- Pager [phone] (7am - 7am)  Advanced Heart Failure Team  Pager [phone] (M-F; 7a - 4p)  Please contact [facility] for night-coverage after hours (4p -7a ) and weekends on [email or web address]     11:05 AM

## 2017-06-21 NOTE — Progress Notes (Signed)
Hypoglycemic Event  CBG: 62  Treatment: 15 GM carbohydrate snack  Symptoms: None  Follow-up CBG: [id number] CBG Result:82  Possible Reasons for Event: Unknown  Comments/MD notified:Will notify [name] on rounds    Khrista Braun Y

## 2017-06-21 NOTE — Progress Notes (Signed)
Patient ID: Trevor [name], male   DOB: 10/07/1946, 71 y.o.   MRN: [id number] S: Feels well today O:BP (!) 89/80   Pulse 91   Temp 98.4 F (36.9 C) (Oral)   Resp 13   Ht 5\' 5"  (1.651 m)   Wt 82.6 kg (182 lb)   SpO2 95%   BMI 30.29 kg/m   Intake/Output Summary (Last 24 hours) at 06/21/17 1031 Last data filed at 06/21/17 1000  Gross per 24 hour  Intake             1434 ml  Output             1431 ml  Net                3 ml   Intake/Output: I/O last 3 completed shifts: In: 6073 [P.O.:860; I.V.:455; IV Piggyback:294] Out: 7106 [Urine:1050; YIRSW:5462; Stool:1]  Intake/Output this shift:  Total I/O In: 319 [P.O.:280; I.V.:39] Out: 400 [Urine:400] Weight change: -3.3 kg (-7 lb 4.4 oz) Gen: NAD CVS: mechanical hum around precordium Resp: cta VOJ:[id number] Ext:1+edema   Recent Labs Lab 06/18/17 0406 06/18/17 1600 06/19/17 0400 06/19/17 1630 06/20/17 0401 06/20/17 1729 06/21/17 0430  NA 126* 133* 132* 134* 136 135 135  K 3.6 3.4* 3.4* 3.5 3.4* 3.8 3.4*  CL 89* 99* 96* 99* 100* 102 101  CO2 24 24 27 27 28 26 26   GLUCOSE 218* 216* 231* 157* 110* 260* 93  BUN 74* 52* 42* 32* 29* 25* 38*  CREATININE 4.84* 3.43* 2.96* 2.50* 2.37* 2.30* 3.36*  ALBUMIN 3.7 3.3* 3.7 3.5 3.4* 3.4* 3.2*  CALCIUM 8.1* 7.7* 8.5* 8.5* 8.6* 8.3* 8.5*  PHOS 5.5* 3.2 2.4* 3.0 2.0* 2.3* 2.9   Liver Function Tests:  Recent Labs Lab 06/20/17 0401 06/20/17 1729 06/21/17 0430  ALBUMIN 3.4* 3.4* 3.2*   No results for input(s): LIPASE, AMYLASE in the last 168 hours. No results for input(s): AMMONIA in the last 168 hours. CBC:  Recent Labs Lab 06/17/17 0253 06/18/17 0406 06/19/17 0400 06/20/17 0400 06/21/17 0430  WBC 11.0* 11.3* 17.5* 18.4* 14.7*  HGB 7.9* 8.4* 8.5* 8.7* 7.9*  HCT 22.6* 23.7* 24.5* 25.9* 24.0*  MCV 85.9 87.1 90.1 92.8 95.2  PLT 103* 115* 108* 113* 92*   Cardiac Enzymes: No results for input(s): CKTOTAL, CKMB, CKMBINDEX, TROPONINI in the last 168 hours. CBG:  Recent  Labs Lab 06/20/17 1624 06/20/17 2051 06/21/17 0810 06/21/17 0831 06/21/17 0855  GLUCAP 257* 245* 61* 62* 82    Iron Studies: No results for input(s): IRON, TIBC, TRANSFERRIN, FERRITIN in the last 72 hours. Studies/Results: No results found. Marland Kitchen amLODipine  5 mg Oral Daily  . atorvastatin  40 mg Oral Daily  . budesonide  0.25 mg Nebulization BID  . busPIRone  5 mg Oral BID  . Chlorhexidine Gluconate Cloth  6 each Topical Daily  . darbepoetin (ARANESP) injection - NON-DIALYSIS  150 mcg Subcutaneous Q Fri-1800  . docusate sodium  100 mg Oral BID  . gabapentin  300 mg Oral QHS  . hydrALAZINE  37.5 mg Oral Q8H  . insulin aspart  0-15 Units Subcutaneous TID WC  . insulin aspart  0-5 Units Subcutaneous QHS  . insulin glargine  20 Units Subcutaneous QHS  . levothyroxine  25 mcg Oral QAC breakfast  . multivitamin with minerals  1 tablet Oral Q1200  . neomycin-bacitracin-polymyxin   Topical Daily  . pantoprazole  40 mg Oral Q1200  . rOPINIRole  0.25 mg Oral QHS  .  sildenafil  40 mg Oral TID  . sodium chloride flush  10-40 mL Intracatheter Q12H  . sodium chloride flush  3 mL Intravenous Q12H  . ascorbic acid  1,000 mg Oral Q1200    BMET    Component Value Date/Time   NA 135 06/21/2017 0430   K 3.4 (L) 06/21/2017 0430   CL 101 06/21/2017 0430   CO2 26 06/21/2017 0430   GLUCOSE 93 06/21/2017 0430   BUN 38 (H) 06/21/2017 0430   CREATININE 3.36 (H) 06/21/2017 0430   CALCIUM 8.5 (L) 06/21/2017 0430   GFRNONAA 17 (L) 06/21/2017 0430   GFRAA 20 (L) 06/21/2017 0430   CBC    Component Value Date/Time   WBC 14.7 (H) 06/21/2017 0430   RBC 2.52 (L) 06/21/2017 0430   HGB 7.9 (L) 06/21/2017 0430   HCT 24.0 (L) 06/21/2017 0430   HCT 26.1 (L) 12/08/2016 0334   PLT 92 (L) 06/21/2017 0430   MCV 95.2 06/21/2017 0430   MCH 31.3 06/21/2017 0430   MCHC 32.9 06/21/2017 0430   RDW 16.5 (H) 06/21/2017 0430   LYMPHSABS 1.4 03/27/2017 1214   MONOABS 0.7 03/27/2017 1214   EOSABS 0.2  03/27/2017 1214   BASOSABS 0.0 03/27/2017 1214     Assessment/Plan:  1. AKI/CKD stage 4 in setting of decompensated CHF/cardiorenal syndrome: Doing well on CRRT via temp HD cath. Started 8/14. ~16.7L down from CRRT with 1L UOP OFF LASIX. CVP 10 (goal 8-12).  Off of CVVHD 06/20/17 and he is now near dry weight of 175 1. Increase lasix to 160mg  bid 2. Continue to hold off on CVVHD or Transition to IHD for now 3. Appreciate assistance from HF with obtaining protocol re: LVAD + HD  4. Recommend AVG vs AVF placement as shorter maturation time, IF able to tolerate IHD & outpatient HD can be arranged (will also need tunneled HD catheter as well).  2. Acute on chronic systolic CHF/ischemic Cardiomyopathy: EF 20-25% s/p LVAD placement as well as right heart failure seen on RHC 06/13/17. OFF Milrinone, BP low-normal.  1. Per HF 3. Hypophosphatemia: Renvela was d/c. Phos replaced and will continue to follow. 4. A fib on Heparin 1. On warfarin at home. Hold currently 5. RV failure- as above, Off milrinone.  6. Hypotension: OK off support 7. Disposition- doing OK so far on trial of CRRT. This remains a difficult situation due to his cardiac status, presence of LVAD and poor response with IV diuretics. He understands that if he does not tolerate this that there is nothing else to be offered.   Donetta Potts, MD [employer] [phone]

## 2017-06-21 NOTE — Progress Notes (Addendum)
ANTICOAGULATION CONSULT NOTE  Pharmacy Consult for heparin Indication: LVAD  No Known Allergies  Patient Measurements: Height: 5\' 5"  (165.1 cm) Weight: 182 lb (82.6 kg) IBW/kg (Calculated) : 61.5 Heparin Dosing Weight = 80.7 kg  Vital Signs: Temp: 98.4 F (36.9 C) (08/18 0813) Temp Source: Oral (08/18 0813) BP: 102/84 (08/18 0900)  Labs:  Recent Labs  06/19/17 0400  06/20/17 0400 06/20/17 0401 06/20/17 1729 06/21/17 0430  HGB 8.5*  --  8.7*  --   --  7.9*  HCT 24.5*  --  25.9*  --   --  24.0*  PLT 108*  --  113*  --   --  92*  LABPROT 20.4*  --  17.6*  --   --  16.4*  INR 1.72  --  1.43  --   --  1.31  HEPARINUNFRC 0.48  --   --  0.50  --  0.50  CREATININE 2.96*  < >  --  2.37* 2.30* 3.36*  < > = values in this interval not displayed.  Estimated Creatinine Clearance: 19.9 mL/min (A) (by C-G formula based on SCr of 3.36 mg/dL (H)).   Medical History: Past Medical History:  Diagnosis Date  . AICD (automatic cardioverter/defibrillator) present   . Asthma   . CHF (congestive heart failure) ([place])   . Diabetes ([place])   . Kidney disease   . Presence of permanent cardiac pacemaker    AICD  . Sleep apnea    Assessment: 71 y.o.malewith a history of CAD s/p CABG x 4 2010, OSA, AS with TAVR 2015, CKD, DM2, paroxysmal atrial fibrillation, asthma, and ischemic cardiomyopathy with Medtronic ICD.   INR 1.31 down today, hgb down 7.9, heparin level continues to be at goal on 1300 units/hr. LDH trending down and plt count low stable.  Goal of Therapy:  INR goal 2-2.5  Heparin Level 0.3-0.5 Monitor platelets by anticoagulation protocol: Yes   Plan:  -Continue heparin at 1300 units/hr. -Monitor heparin level, CBC, S/Sx bleeding daily. -F/u plans for [facility]?  Resume Coumadin eventually?  Uvaldo Rising, BCPS  Clinical Pharmacist Pager [phone]  06/21/2017 9:49 AM

## 2017-06-22 LAB — RENAL FUNCTION PANEL
Albumin: 3.2 g/dL — ABNORMAL LOW (ref 3.5–5.0)
Anion gap: 10 (ref 5–15)
BUN: 50 mg/dL — AB (ref 6–20)
CHLORIDE: 101 mmol/L (ref 101–111)
CO2: 23 mmol/L (ref 22–32)
CREATININE: 4.47 mg/dL — AB (ref 0.61–1.24)
Calcium: 8.5 mg/dL — ABNORMAL LOW (ref 8.9–10.3)
GFR calc Af Amer: 14 mL/min — ABNORMAL LOW (ref >60)
GFR calc non Af Amer: 12 mL/min — ABNORMAL LOW (ref >60)
GLUCOSE: 142 mg/dL — AB (ref 65–99)
Phosphorus: 4.2 mg/dL (ref 2.5–4.6)
Potassium: 4 mmol/L (ref 3.5–5.1)
Sodium: 134 mmol/L — ABNORMAL LOW (ref 135–145)

## 2017-06-22 LAB — GLUCOSE, CAPILLARY
GLUCOSE-CAPILLARY: 151 mg/dL — AB (ref 65–99)
GLUCOSE-CAPILLARY: 154 mg/dL — AB (ref 65–99)
GLUCOSE-CAPILLARY: 175 mg/dL — AB (ref 65–99)
Glucose-Capillary: 110 mg/dL — ABNORMAL HIGH (ref 65–99)

## 2017-06-22 LAB — CBC
HEMATOCRIT: 23.5 % — AB (ref 39.0–52.0)
HEMATOCRIT: 26.2 % — AB (ref 39.0–52.0)
HEMOGLOBIN: 8.5 g/dL — AB (ref 13.0–17.0)
Hemoglobin: 7.7 g/dL — ABNORMAL LOW (ref 13.0–17.0)
MCH: 31 pg (ref 26.0–34.0)
MCH: 30.4 pg (ref 26.0–34.0)
MCHC: 32.8 g/dL (ref 30.0–36.0)
MCHC: 32.4 g/dL (ref 30.0–36.0)
MCV: 94.8 fL (ref 78.0–100.0)
MCV: 93.6 fL (ref 78.0–100.0)
Platelets: 87 10*3/uL — ABNORMAL LOW (ref 150–400)
Platelets: 82 10*3/uL — ABNORMAL LOW (ref 150–400)
RBC: 2.48 MIL/uL — ABNORMAL LOW (ref 4.22–5.81)
RBC: 2.8 MIL/uL — AB (ref 4.22–5.81)
RDW: 17.2 % — AB (ref 11.5–15.5)
RDW: 17.7 % — ABNORMAL HIGH (ref 11.5–15.5)
WBC: 15.4 10*3/uL — ABNORMAL HIGH (ref 4.0–10.5)
WBC: 16.4 10*3/uL — AB (ref 4.0–10.5)

## 2017-06-22 LAB — LACTATE DEHYDROGENASE: LDH: 260 U/L — ABNORMAL HIGH (ref 98–192)

## 2017-06-22 LAB — PROTIME-INR
INR: 1.23
PROTHROMBIN TIME: 15.6 s — AB (ref 11.4–15.2)

## 2017-06-22 LAB — MAGNESIUM: Magnesium: 2.1 mg/dL (ref 1.7–2.4)

## 2017-06-22 LAB — PREPARE RBC (CROSSMATCH)

## 2017-06-22 LAB — HEPARIN LEVEL (UNFRACTIONATED): HEPARIN UNFRACTIONATED: 0.42 [IU]/mL (ref 0.30–0.70)

## 2017-06-22 MED ORDER — SODIUM CHLORIDE 0.9 % IV SOLN
Freq: Once | INTRAVENOUS | Status: AC
  Administered 2017-06-25: 15:00:00 via INTRAVENOUS

## 2017-06-22 NOTE — Progress Notes (Signed)
Patient ID: Trevor [name], male   DOB: 29-Apr-1946, 71 y.o.   MRN: [id number] S: Feels great O:BP 94/78 (BP Location: Left Arm)   Pulse 91   Temp 97.7 F (36.5 C) (Oral)   Resp 15   Ht 5\' 5"  (1.651 m)   Wt 81.4 kg (179 lb 7.5 oz)   SpO2 98%   BMI 29.87 kg/m   Intake/Output Summary (Last 24 hours) at 06/22/17 1101 Last data filed at 06/22/17 0900  Gross per 24 hour  Intake             1272 ml  Output             1625 ml  Net             -353 ml   Intake/Output: I/O last 3 completed shifts: In: 7829 [P.O.:930; I.V.:468; IV Piggyback:132] Out: 2025 [Urine:2025]  Intake/Output this shift:  Total I/O In: 556 [P.O.:480; I.V.:26; IV Piggyback:50] Out: -  Weight change: 1.855 kg (4 lb 1.4 oz) Gen:NAD CVS: mechanical hum Resp:cta FAO:ZHYQMV HQI:ONGEX edema   Recent Labs Lab 06/18/17 1600 06/19/17 0400 06/19/17 1630 06/20/17 0401 06/20/17 1729 06/21/17 0430 06/22/17 0437  NA 133* 132* 134* 136 135 135 134*  K 3.4* 3.4* 3.5 3.4* 3.8 3.4* 4.0  CL 99* 96* 99* 100* 102 101 101  CO2 24 27 27 28 26 26 23   GLUCOSE 216* 231* 157* 110* 260* 93 142*  BUN 52* 42* 32* 29* 25* 38* 50*  CREATININE 3.43* 2.96* 2.50* 2.37* 2.30* 3.36* 4.47*  ALBUMIN 3.3* 3.7 3.5 3.4* 3.4* 3.2* 3.2*  CALCIUM 7.7* 8.5* 8.5* 8.6* 8.3* 8.5* 8.5*  PHOS 3.2 2.4* 3.0 2.0* 2.3* 2.9 4.2   Liver Function Tests:  Recent Labs Lab 06/20/17 1729 06/21/17 0430 06/22/17 0437  ALBUMIN 3.4* 3.2* 3.2*   No results for input(s): LIPASE, AMYLASE in the last 168 hours. No results for input(s): AMMONIA in the last 168 hours. CBC:  Recent Labs Lab 06/18/17 0406 06/19/17 0400 06/20/17 0400 06/21/17 0430 06/22/17 0437  WBC 11.3* 17.5* 18.4* 14.7* 15.4*  HGB 8.4* 8.5* 8.7* 7.9* 7.7*  HCT 23.7* 24.5* 25.9* 24.0* 23.5*  MCV 87.1 90.1 92.8 95.2 94.8  PLT 115* 108* 113* 92* 87*   Cardiac Enzymes: No results for input(s): CKTOTAL, CKMB, CKMBINDEX, TROPONINI in the last 168 hours. CBG:  Recent Labs Lab  06/21/17 0855 06/21/17 1214 06/21/17 1611 06/21/17 2055 06/22/17 0745  GLUCAP 82 162* 175* 242* 110*    Iron Studies: No results for input(s): IRON, TIBC, TRANSFERRIN, FERRITIN in the last 72 hours. Studies/Results: No results found. Marland Kitchen amLODipine  5 mg Oral Daily  . atorvastatin  40 mg Oral Daily  . budesonide  0.25 mg Nebulization BID  . busPIRone  5 mg Oral BID  . Chlorhexidine Gluconate Cloth  6 each Topical Daily  . darbepoetin (ARANESP) injection - NON-DIALYSIS  150 mcg Subcutaneous Q Fri-1800  . docusate sodium  100 mg Oral BID  . gabapentin  300 mg Oral QHS  . hydrALAZINE  37.5 mg Oral Q8H  . insulin aspart  0-15 Units Subcutaneous TID WC  . insulin aspart  0-5 Units Subcutaneous QHS  . insulin glargine  20 Units Subcutaneous QHS  . levothyroxine  25 mcg Oral QAC breakfast  . multivitamin with minerals  1 tablet Oral Q1200  . neomycin-bacitracin-polymyxin   Topical Daily  . pantoprazole  40 mg Oral Q1200  . rOPINIRole  0.25 mg Oral QHS  . sildenafil  40 mg Oral TID  . sodium chloride flush  10-40 mL Intracatheter Q12H  . ascorbic acid  1,000 mg Oral Q1200    BMET    Component Value Date/Time   NA 134 (L) 06/22/2017 0437   K 4.0 06/22/2017 0437   CL 101 06/22/2017 0437   CO2 23 06/22/2017 0437   GLUCOSE 142 (H) 06/22/2017 0437   BUN 50 (H) 06/22/2017 0437   CREATININE 4.47 (H) 06/22/2017 0437   CALCIUM 8.5 (L) 06/22/2017 0437   GFRNONAA 12 (L) 06/22/2017 0437   GFRAA 14 (L) 06/22/2017 0437   CBC    Component Value Date/Time   WBC 15.4 (H) 06/22/2017 0437   RBC 2.48 (L) 06/22/2017 0437   HGB 7.7 (L) 06/22/2017 0437   HCT 23.5 (L) 06/22/2017 0437   HCT 26.1 (L) 12/08/2016 0334   PLT 87 (L) 06/22/2017 0437   MCV 94.8 06/22/2017 0437   MCH 31.0 06/22/2017 0437   MCHC 32.8 06/22/2017 0437   RDW 17.2 (H) 06/22/2017 0437   LYMPHSABS 1.4 03/27/2017 1214   MONOABS 0.7 03/27/2017 1214   EOSABS 0.2 03/27/2017 1214   BASOSABS 0.0 03/27/2017 1214     Assessment/Plan:  1. AKI/CKD stage 4 in setting of decompensated CHF/cardiorenal syndrome: Doing well on CRRT via temp HD cath. Started 8/14. ~16.7L down from CRRT with 1L UOP OFF LASIX. CVP 10 (goal 8-12).  Off of CVVHD 06/20/17 and he is now near dry weight of 175 1. Increase IV lasix to 160mg  bid with good response, however failed outpatient therapy.  Will d/c if tolerates IHD 2. Continue to hold off on CVVHD or Transition to IHD for now 3. Appreciate assistance from HF with obtaining protocol re: LVAD + HD  4. Recommend AVG vs AVF placement as shorter maturation time, IF able to tolerate IHD &outpatient HD can be arranged (will also need tunneled HD catheter as well).  5. Plan for trial of IHD and if tolerates will proceed with tunneled HD cath and AVG with VVS. 2. Acute on chronic systolic CHF/ischemic Cardiomyopathy:EF 20-25% s/p LVAD placement as well as right heart failure seen on RHC 06/13/17. OFF Milrinone, BP low-normal.  1. Per HF 2. Has failed outpatient therapy  3. Hypophosphatemia: Renvela was d/c. Phos replaced and now normal 1. Continue to hold renvela and follow Phos levels  4. A fib on Heparin 1. On warfarin at home. Hold currently 5. RV failure- as above, Off milrinone.  6. Hypotension: OK off support 7. Disposition- doing OK so far on trial of CRRT. This remains a difficult situation due to his cardiac status, presence of LVAD and poor response with IV diuretics. He understands that if he does not tolerate this that there is nothing else to be offered.   Donetta Potts, MD [employer] [phone]

## 2017-06-22 NOTE — Progress Notes (Addendum)
Patient ID: Trevor [name], male   DOB: [birth year], 71 y.o.   MRN: [id number]    Advanced Heart Failure VAD Team Note  Subjective:    Started on CVVHD 06/17/17 via temporary cath.   CVVHD stopped 8/17. IV lasix started 8/18. Weight down 3 pounds overnight. CVP 9. Cr 2.3 -> 3.3 -> 4.5. BUN 50  Feels fine. Denies dyspnea.    Studies:  RHC (06/13/17):  RA = 17 RV = 58/18 PA = 56/25 (37) PCW = 20 (v = 35) Fick cardiac output/index = 6.4/3.2 Thermo CO/CI = 4.1/2.1 PVR = 2.6 Ao sat = 98% PA sat = 56%, 60% Increased LVAD speed to 9600   PCW down to 19  LVAD INTERROGATION:  HeartMate II LVAD:  Flow 5.6 liters/min, speed 9600, power 6.0, PI 3.5. Occasional PI event  Objective:    Vital Signs:   Temp:  [97.3 F (36.3 C)-98.6 F (37 C)] 97.7 F (36.5 C) (08/19 0757) Resp:  [9-23] 15 (08/19 0900) BP: (79-129)/(60-104) 94/78 (08/19 0600) SpO2:  [94 %-98 %] 98 % (08/19 0800) Weight:  [81.4 kg (179 lb 7.5 oz)] 81.4 kg (179 lb 7.5 oz) (08/19 0500) Last BM Date: 06/21/17 Mean arterial Pressure 80-90s  Intake/Output:   Intake/Output Summary (Last 24 hours) at 06/22/17 1035 Last data filed at 06/22/17 0900  Gross per 24 hour  Intake             1411 ml  Output             1625 ml  Net             -214 ml     Physical Exam    General:  Sitting in chair. No resp difficulty HEENT: normal Neck: supple. JVP 9 Carotids 2+ bilat; no bruits. No lymphadenopathy or thryomegaly appreciated. Cor: PMI nondisplaced. Regular rate & rhythm. No rubs, gallops or murmurs. Left subclavian trialysis cath  Lungs: clear Abdomen: soft, nontender, nondistended. No hepatosplenomegaly. No bruits or masses. Good bowel sounds. Extremities: no cyanosis, clubbing, rash, edema skin cancer sore on R shin Neuro: alert & orientedx3, cranial nerves grossly intact. moves all 4 extremities w/o difficulty. Affect pleasant  Telemetry   Personally reviewed, Afib 65-75 + PVCs  Labs   Basic Metabolic  Panel:  Recent Labs Lab 06/18/17 0406  06/19/17 0400 06/19/17 1630 06/20/17 0400 06/20/17 0401 06/20/17 1729 06/21/17 0430 06/22/17 0437  NA 126*  < > 132* 134*  --  136 135 135 134*  K 3.6  < > 3.4* 3.5  --  3.4* 3.8 3.4* 4.0  CL 89*  < > 96* 99*  --  100* 102 101 101  CO2 24  < > 27 27  --  28 26 26 23   GLUCOSE 218*  < > 231* 157*  --  110* 260* 93 142*  BUN 74*  < > 42* 32*  --  29* 25* 38* 50*  CREATININE 4.84*  < > 2.96* 2.50*  --  2.37* 2.30* 3.36* 4.47*  CALCIUM 8.1*  < > 8.5* 8.5*  --  8.6* 8.3* 8.5* 8.5*  MG 1.8  --  2.0  --  2.3  --   --  2.2 2.1  PHOS 5.5*  < > 2.4* 3.0  --  2.0* 2.3* 2.9 4.2  < > = values in this interval not displayed.  Liver Function Tests:  Recent Labs Lab 06/19/17 1630 06/20/17 0401 06/20/17 1729 06/21/17 0430 06/22/17 0437  ALBUMIN 3.5 3.4* 3.4*  3.2* 3.2*   No results for input(s): LIPASE, AMYLASE in the last 168 hours. No results for input(s): AMMONIA in the last 168 hours.  CBC:  Recent Labs Lab 06/18/17 0406 06/19/17 0400 06/20/17 0400 06/21/17 0430 06/22/17 0437  WBC 11.3* 17.5* 18.4* 14.7* 15.4*  HGB 8.4* 8.5* 8.7* 7.9* 7.7*  HCT 23.7* 24.5* 25.9* 24.0* 23.5*  MCV 87.1 90.1 92.8 95.2 94.8  PLT 115* 108* 113* 92* 87*    INR:  Recent Labs Lab 06/18/17 0406 06/19/17 0400 06/20/17 0400 06/21/17 0430 06/22/17 0437  INR 1.95 1.72 1.43 1.31 1.23    Other results:  EKG:    Imaging   No results found.   Medications:     Scheduled Medications: . amLODipine  5 mg Oral Daily  . atorvastatin  40 mg Oral Daily  . budesonide  0.25 mg Nebulization BID  . busPIRone  5 mg Oral BID  . Chlorhexidine Gluconate Cloth  6 each Topical Daily  . darbepoetin (ARANESP) injection - NON-DIALYSIS  150 mcg Subcutaneous Q Fri-1800  . docusate sodium  100 mg Oral BID  . gabapentin  300 mg Oral QHS  . hydrALAZINE  37.5 mg Oral Q8H  . insulin aspart  0-15 Units Subcutaneous TID WC  . insulin aspart  0-5 Units Subcutaneous  QHS  . insulin glargine  20 Units Subcutaneous QHS  . levothyroxine  25 mcg Oral QAC breakfast  . multivitamin with minerals  1 tablet Oral Q1200  . neomycin-bacitracin-polymyxin   Topical Daily  . pantoprazole  40 mg Oral Q1200  . rOPINIRole  0.25 mg Oral QHS  . sildenafil  40 mg Oral TID  . sodium chloride flush  10-40 mL Intracatheter Q12H  . ascorbic acid  1,000 mg Oral Q1200    Infusions: . furosemide Stopped (06/22/17 0921)  . heparin 1,300 Units/hr (06/22/17 0700)  . heparin    . dialysis replacement fluid (prismasate) 500 mL/hr at 06/20/17 1520  . dialysis replacement fluid (prismasate) 300 mL/hr at 06/20/17 0800  . dialysate (PRISMASATE) 1,000 mL/hr at 06/20/17 1520    PRN Medications: acetaminophen, albuterol, heparin, heparin, ondansetron (ZOFRAN) IV, oxyCODONE, sodium chloride flush, traMADol, traZODone   Patient Profile   Alexa Golebiewski Murphyis a 71 y.o.malewith a history of CAD s/p CABG x 4 2010, OSA, AS with TAVR 2015, CKD, DM2, paroxysmal atrial fibrillation, asthma, and ischemic cardiomyopathy with Medtronic ICD.   He admitted with marked volume overload and AKI despite with increase in outpatient diuretics.    Assessment/Plan:    1. Acute on chronic systolic CHF: Ischemic cardiomyopathy, EF 20-25% with RV dysfunction on 2/18 echo pre-LVAD. 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 Medtronic ICD. Overall, he has felt much better post-LVAD. However, he developed AKI and was eventually admitted in 5/18 after RHC showed elevated filling pressures. He was diuresed and creatinine came down. Since then, we have adjusted up and down his torsemide dose, with higher dose volume improves but creatinine worsens. With lower dose we can make creatinine look better but he gets volume overloaded.  He has again developed worsening volume overload with AKI, creatinine up to 5.79.  He had RHC on 8/10 as above and milrinone 0.125 started, increased to 0.25 on 8/11.  Speed increased to 9600  on 8/10.  - Main issue is severe RV failure - CVVHD stopped 8/17. IV lasix started 8/18. Volume status has responded well but creatinine climbing quickly back to baseline. No uremia - Discussed with Dr. Marval Regal he is hopeful we can  avoid need for outpatient HD now but will continue to watch. Suspect he will need HD in near future, if not prior to d/c. - No ASA with h/o GI bleeding => hold warfarin for now in case procedure needed, cover with heparin gtt while INR subtherapeutic. D/w PharmD today.  2. CAD: s/p CABG. Denies CP. Continue atorvastatin. No change.    3. AKI on CKD stage IV:  - CVVHD stopped 8/17. IV lasix started 8/18. Volume status has responded well but creatinine climbing quickly back to baseline. No uremia - As above. Discussed with Dr. Marval Regal he is hopeful we can avoid need for outpatient HD now but will continue to watch. - Creatinine 2.3->3.3 -> 4.5. Continue to follow trend  - Would not d/c until we are sure we can maintain volume status on po diuretics.  - Consult [facility]VS in am for AVF 4. Atrial fibrillation: Chronic. Warfarin held for now, on heparin gtt.  - Rate stable. No change.  - INR 1.23. Will start coumadin.  5. RV failure: Continue Revatio 40 mg TID.  Now off milrinone.  - Management of volume status as above 6. HTN:  - MAPs 80-90 range. Continue amlodipine to 5 mg daily and continue to follow. Will not push too hard so as to preserve renal perfusion.  7. Anemia: Baseline anemia of renal disease/chronic disease but recent bleeding from colonic AVMs. No overt bleeding, may be primarily anemia of renal disease at this point. FOBT negative. Got 2 units PRBCs with appropriate rise in Hgb.  - Hgb down to 7.7. Bleeding from Trialysis cath has resolved with suturing of cath site.  - Will transfuse 1u RBCs today. 8. 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  No overt bleeding, may be related to anemia of renal disease. FOBT negative this  admission.  - Continue octreotide injections. 9. Skin cancer: On neck and RLE. Needs excision, does not have to stop warfarin. Will address further as outpatient. No change.  - WOC have assessed biopsy sites    I reviewed the LVAD parameters from today, and compared the results to the patient's prior recorded data.  No programming changes were made.  The LVAD is functioning within specified parameters.  The patient performs LVAD self-test daily.  LVAD interrogation was negative for any significant power changes, alarms or PI events/speed drops.  LVAD equipment check completed and is in good working order.  Back-up equipment present.   LVAD education done on emergency procedures and precautions and reviewed exit site care.  Length of Stay: 9  Glori Bickers, MD 06/22/2017, 10:35 AM  VAD Team --- VAD ISSUES ONLY--- Pager [phone] (7am - 7am)  Advanced Heart Failure Team  Pager [phone] (M-F; 7a - 4p)  Please contact [facility] for night-coverage after hours (4p -7a ) and weekends on [email or web address]

## 2017-06-22 NOTE — Progress Notes (Signed)
ANTICOAGULATION CONSULT NOTE  Pharmacy Consult for heparin Indication: LVAD  No Known Allergies  Patient Measurements: Height: 5\' 5"  (165.1 cm) Weight: 179 lb 7.5 oz (81.4 kg) IBW/kg (Calculated) : 61.5 Heparin Dosing Weight = 80.7 kg  Vital Signs: Temp: 97.7 F (36.5 C) (08/19 0757) Temp Source: Oral (08/19 0757) BP: 94/78 (08/19 0600)  Labs:  Recent Labs  06/20/17 0400 06/20/17 0401 06/20/17 1729 06/21/17 0430 06/22/17 0437 06/22/17 0844  HGB 8.7*  --   --  7.9* 7.7*  --   HCT 25.9*  --   --  24.0* 23.5*  --   PLT 113*  --   --  92* 87*  --   LABPROT 17.6*  --   --  16.4* 15.6*  --   INR 1.43  --   --  1.31 1.23  --   HEPARINUNFRC  --  0.50  --  0.50  --  0.42  CREATININE  --  2.37* 2.30* 3.36* 4.47*  --     Estimated Creatinine Clearance: 14.9 mL/min (A) (by C-G formula based on SCr of 4.47 mg/dL (H)).   Medical History: Past Medical History:  Diagnosis Date  . AICD (automatic cardioverter/defibrillator) present   . Asthma   . CHF (congestive heart failure) ([place])   . Diabetes ([place])   . Kidney disease   . Presence of permanent cardiac pacemaker    AICD  . Sleep apnea    Assessment: 70 y.o.malewith a history of CAD s/p CABG x 4 2010, OSA, AS with TAVR 2015, CKD, DM2, paroxysmal atrial fibrillation, asthma, and ischemic cardiomyopathy with Medtronic ICD.   INR 1.23 down today, hgb down 7.7, heparin level continues to be at goal on 1300 units/hr. LDH trending down and plt count low stable.  Goal of Therapy:  INR goal 2-2.5  Heparin Level 0.3-0.5 Monitor platelets by anticoagulation protocol: Yes   Plan:  -Continue heparin at 1300 units/hr. -Monitor heparin level, CBC, S/Sx bleeding daily. -F/u plans for [facility]?  Resume Coumadin eventually?  Trevor [name], BCPS  Clinical Pharmacist Pager [phone]  06/22/2017 11:26 AM

## 2017-06-23 ENCOUNTER — Encounter (HOSPITAL_COMMUNITY): Payer: Medicare Other

## 2017-06-23 DIAGNOSIS — N179 Acute kidney failure, unspecified: Secondary | ICD-10-CM | POA: Diagnosis present

## 2017-06-23 DIAGNOSIS — N170 Acute kidney failure with tubular necrosis: Secondary | ICD-10-CM

## 2017-06-23 LAB — GLUCOSE, CAPILLARY
GLUCOSE-CAPILLARY: 94 mg/dL (ref 65–99)
GLUCOSE-CAPILLARY: 169 mg/dL — AB (ref 65–99)
Glucose-Capillary: 280 mg/dL — ABNORMAL HIGH (ref 65–99)
Glucose-Capillary: 76 mg/dL (ref 65–99)

## 2017-06-23 LAB — BPAM RBC
Blood Product Expiration Date: 201809032359
ISSUE DATE / TIME: 201808191417
Unit Type and Rh: 600

## 2017-06-23 LAB — HEPARIN LEVEL (UNFRACTIONATED): HEPARIN UNFRACTIONATED: 0.35 [IU]/mL (ref 0.30–0.70)

## 2017-06-23 LAB — CBC
HCT: 26.6 % — ABNORMAL LOW (ref 39.0–52.0)
Hemoglobin: 8.6 g/dL — ABNORMAL LOW (ref 13.0–17.0)
MCH: 30 pg (ref 26.0–34.0)
MCHC: 32.3 g/dL (ref 30.0–36.0)
MCV: 92.7 fL (ref 78.0–100.0)
PLATELETS: 84 10*3/uL — AB (ref 150–400)
RBC: 2.87 MIL/uL — AB (ref 4.22–5.81)
RDW: 18 % — AB (ref 11.5–15.5)
WBC: 18.5 10*3/uL — AB (ref 4.0–10.5)

## 2017-06-23 LAB — TYPE AND SCREEN
ABO/RH(D): A NEG
Antibody Screen: NEGATIVE
Unit division: 0

## 2017-06-23 LAB — RENAL FUNCTION PANEL
ANION GAP: 10 (ref 5–15)
Albumin: 3.5 g/dL (ref 3.5–5.0)
BUN: 60 mg/dL — ABNORMAL HIGH (ref 6–20)
CALCIUM: 8.7 mg/dL — AB (ref 8.9–10.3)
CO2: 25 mmol/L (ref 22–32)
CREATININE: 5.39 mg/dL — AB (ref 0.61–1.24)
Chloride: 98 mmol/L — ABNORMAL LOW (ref 101–111)
GFR, EST AFRICAN AMERICAN: 11 mL/min — AB (ref >60)
GFR, EST NON AFRICAN AMERICAN: 10 mL/min — AB (ref >60)
Glucose, Bld: 51 mg/dL — ABNORMAL LOW (ref 65–99)
PHOSPHORUS: 5.1 mg/dL — AB (ref 2.5–4.6)
Potassium: 3.9 mmol/L (ref 3.5–5.1)
SODIUM: 133 mmol/L — AB (ref 135–145)

## 2017-06-23 LAB — PROTIME-INR
INR: 1.22
PROTHROMBIN TIME: 15.5 s — AB (ref 11.4–15.2)

## 2017-06-23 LAB — MAGNESIUM: Magnesium: 1.8 mg/dL (ref 1.7–2.4)

## 2017-06-23 LAB — LACTATE DEHYDROGENASE: LDH: 260 U/L — ABNORMAL HIGH (ref 98–192)

## 2017-06-23 MED ORDER — SODIUM CHLORIDE 0.9 % IV SOLN: 100.0000 mL | INTRAVENOUS | Status: DC | PRN

## 2017-06-23 MED ORDER — SEVELAMER CARBONATE 2.4 G PO PACK
2.4000 g | PACK | Freq: Three times a day (TID) | ORAL | Status: DC
  Administered 2017-06-23 – 2017-07-07 (×36): 2.4 g via ORAL
  Filled 2017-06-23 (×43): qty 1

## 2017-06-23 MED ORDER — FUROSEMIDE 80 MG PO TABS
160.0000 mg | ORAL_TABLET | Freq: Three times a day (TID) | ORAL | Status: DC
  Administered 2017-06-23 – 2017-06-24 (×4): 160 mg via ORAL
  Filled 2017-06-23 (×4): qty 2

## 2017-06-23 MED ORDER — PENTAFLUOROPROP-TETRAFLUOROETH EX AERO: 1.0000 "application " | INHALATION_SPRAY | CUTANEOUS | Status: DC | PRN

## 2017-06-23 MED ORDER — RENA-VITE PO TABS: 1.0000 | ORAL_TABLET | Freq: Every day | ORAL | Status: DC

## 2017-06-23 MED ORDER — HEPARIN SODIUM (PORCINE) 1000 UNIT/ML DIALYSIS: 1000.0000 [IU] | INTRAMUSCULAR | Status: DC | PRN

## 2017-06-23 MED ORDER — RENA-VITE PO TABS
1.0000 | ORAL_TABLET | Freq: Every day | ORAL | Status: DC
  Administered 2017-06-23 – 2017-07-06 (×14): 1 via ORAL
  Filled 2017-06-23 (×16): qty 1

## 2017-06-23 MED ORDER — LIDOCAINE HCL (PF) 1 % IJ SOLN: 5.0000 mL | INTRAMUSCULAR | Status: DC | PRN

## 2017-06-23 MED ORDER — HEPARIN SODIUM (PORCINE) 1000 UNIT/ML DIALYSIS: 20.0000 [IU]/kg | INTRAMUSCULAR | Status: DC | PRN

## 2017-06-23 MED ORDER — ALTEPLASE 2 MG IJ SOLR: 2.0000 mg | Freq: Once | INTRAMUSCULAR | Status: DC | PRN

## 2017-06-23 MED ORDER — LIDOCAINE-PRILOCAINE 2.5-2.5 % EX CREA
1.0000 "application " | TOPICAL_CREAM | CUTANEOUS | Status: DC | PRN
  Filled 2017-06-23: qty 5

## 2017-06-23 NOTE — Progress Notes (Signed)
Subjective: Interval History: has no complaint questions about how things are going.  Objective: Vital signs in last 24 hours: Temp:  [97.2 F (36.2 C)-98.4 F (36.9 C)] 97.8 F (36.6 C) (08/20 0755) Pulse Rate:  [41-94] 94 (08/19 2349) Resp:  [9-25] 15 (08/20 0800) BP: (98-112)/(85-86) 101/85 (08/20 0800) SpO2:  [97 %-98 %] 98 % (08/20 0800) Weight:  [83.9 kg (184 lb 14.4 oz)] 83.9 kg (184 lb 14.4 oz) (08/20 0500) Weight change: 1.315 kg (2 lb 14.4 oz)  Intake/Output from previous day: 08/19 0701 - 08/20 0700 In: 1967 [P.O.:1200; I.V.:312; Blood:345; IV Piggyback:110] Out: 2105 [Urine:2105] Intake/Output this shift: Total I/O In: 79 [I.V.:13; IV Piggyback:66] Out: -   General appearance: alert, cooperative, no distress, mildly obese and pale Neck: L IJ cath Resp: diminished breath sounds bilaterally and rales bibasilar Cardio: S1, S2 normal and cont hum GI: obese, pos bs. liver down 5 cm 2+remities: edema dressing R calf and none  Lab Results:  Recent Labs  06/22/17 1930 06/23/17 0545  WBC 16.4* 18.5*  HGB 8.5* 8.6*  HCT 26.2* 26.6*  PLT 82* 84*   BMET:  Recent Labs  06/22/17 0437 06/23/17 0545  NA 134* 133*  K 4.0 3.9  CL 101 98*  CO2 23 25  GLUCOSE 142* 51*  BUN 50* 60*  CREATININE 4.47* 5.39*  CALCIUM 8.5* 8.7*   No results for input(s): PTH in the last 72 hours. Iron Studies: No results for input(s): IRON, TIBC, TRANSFERRIN, FERRITIN in the last 72 hours.  Studies/Results: No results found.  I have reviewed the patient's current medications.  Assessment/Plan: 1 CKD 4-5/AKI nonoliguric , use po diuretics. For trial IHD as has no clearance.  Vol xs 2 bp stop amlod 3 LVAD 4 CM  5 low ptlt 6 ^ WBC 7 Anemia check Fe 8 HPTH check P as above, IHD, po lasix, add P binder, check PTH, Fe,     LOS: 10 days   Bridie Colquhoun L 06/23/2017,8:27 AM

## 2017-06-23 NOTE — Progress Notes (Signed)
LVAD Coordinator Rounding Note:  Admitted 06/13/17 due to RV failure, marked volume overload.   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 Darcey Nora under Destination Therapy criteria.  Vital signs: HR: 63 Doppler Pressure: 98 Automatic BP: 101/85 (91) O2 Sat: 98% on RA Wt in lbs: 197>197>199>199>196>190>185>184   LVAD interrogation reveals:   Speed:9600 Flow: 6.5 Power:  6.1w PI: 4.1 Alarms: none Events:  12 PI events 06/20/17; 4 PI events on 06/19/17 Fixed speed: 9600 Low speed limit: 9000  Drive Line: Sorbaview dressing dry and intact; needs weekly dressing changes. Next dressing change is due today 8/20.   Labs:  LDH trend: 317>270>295>255>271>293>304>297>260  INR trend: 2.15>2.3>2.04>1.94>1.79>1.66>1.95>1.72>1.43  Creat trend:  4.79>5.80>5.73>5.79>6.08>6.56>6.73>6.74>4.84>3.43>2.96>2.50>2.37>5.39   Hgb trend: 7.4>6.5>8.6>7.9>7.9>8.4>8.5>8.7>8.6  WBC trend: 10.7>13.3>11.0>11.3>17.5>18.4>18.5  Blood Transfusion: 06/14/17 - 2 units PCs  Renal: -temporary dialysis catheter placed 06/17/17   -CVVH initiated 06/17/17   Gtts: Heparin 1300 u/hr  Anticoagulation Plan: -INR Goal:  2.0 - 2.5 - warfarin on hold -ASA Dose: 81 mg daily - on hold  Device: -Medtronic single lead -Therapies: on   Plan/Recommendations:   1. Pt to receive hemodialysis today. 2. We will work on outpatient plan for pts hemo.  3. Please page VAD coordinator for equipment issues.   [name], BSN  VAD Coordinator [phone] 24/7 pager

## 2017-06-23 NOTE — Progress Notes (Signed)
ANTICOAGULATION CONSULT NOTE  Pharmacy Consult for heparin Indication: LVAD  No Known Allergies  Patient Measurements: Height: 5\' 5"  (165.1 cm) Weight: 184 lb 14.4 oz (83.9 kg) IBW/kg (Calculated) : 61.5 Heparin Dosing Weight = 80.7 kg  Vital Signs: Temp: 98.2 F (36.8 C) (08/20 1139) Temp Source: Oral (08/20 1139) BP: 101/85 (08/20 0800)  Labs:  Recent Labs  06/21/17 0430 06/22/17 0437 06/22/17 0844 06/22/17 1930 06/23/17 0545  HGB 7.9* 7.7*  --  8.5* 8.6*  HCT 24.0* 23.5*  --  26.2* 26.6*  PLT 92* 87*  --  82* 84*  LABPROT 16.4* 15.6*  --   --  15.5*  INR 1.31 1.23  --   --  1.22  HEPARINUNFRC 0.50  --  0.42  --  0.35  CREATININE 3.36* 4.47*  --   --  5.39*    Estimated Creatinine Clearance: 12.5 mL/min (A) (by C-G formula based on SCr of 5.39 mg/dL (H)).   Medical History: Past Medical History:  Diagnosis Date  . AICD (automatic cardioverter/defibrillator) present   . Asthma   . CHF (congestive heart failure) ([place])   . Diabetes ([place])   . Kidney disease   . Presence of permanent cardiac pacemaker    AICD  . Sleep apnea    Assessment: 70 y.o.malewith a history of CAD s/p CABG x 4 2010, OSA, AS with TAVR 2015, CKD, DM2, paroxysmal atrial fibrillation, asthma, and ischemic cardiomyopathy with Medtronic ICD.   INR 1.22 today holding warfarin, hgb improved 8.6 after PRBC yesterday. Heparin level 0.35  continues to be at goal on 1300 units/hr. LDH stable 260 and plt count low stable.  Goal of Therapy:  INR goal 2-2.5  Heparin Level 0.3-0.5 Monitor platelets by anticoagulation protocol: Yes   Plan:  -Continue heparin at 1300 units/hr. -Monitor heparin level, CBC, S/Sx bleeding daily. -F/u plans for [facility]?  Resume Coumadin eventually?  [name] Pharm.D. CPP, BCPS Clinical Pharmacist [phone] 06/23/2017 12:25 PM

## 2017-06-23 NOTE — Progress Notes (Signed)
Patient ID: Trevor [name], male   DOB: 1946-05-09, 71 y.o.   MRN: [id number]    Advanced Heart Failure VAD Team Note  Subjective:    Started on CVVHD 06/17/17 via temporary cath.   CVVHD stopped 8/17. IV lasix started 8/18.   Weight up 5 lbs. CVP 11-12. Cr 2.3 -> 3.3 -> 4.5 -> 5.39. BUN 60   Down today after hearing he will need iHD.  Optimistic about his options.  Denies SOB, lightheadedness or dizziness. Making urine, but understand he is not filtering.   Studies:  RHC (06/13/17):  RA = 17 RV = 58/18 PA = 56/25 (37) PCW = 20 (v = 35) Fick cardiac output/index = 6.4/3.2 Thermo CO/CI = 4.1/2.1 PVR = 2.6 Ao sat = 98% PA sat = 56%, 60% Increased LVAD speed to 9600   PCW down to 19  LVAD INTERROGATION:  HeartMate II LVAD:  Flow 5.6 liters/min, speed 9600, power 6.0, PI 5.6. Occasional PI event.    Objective:    Vital Signs:   Temp:  [97.2 F (36.2 C)-98.4 F (36.9 C)] 97.8 F (36.6 C) (08/20 0755) Pulse Rate:  [41-94] 94 (08/19 2349) Resp:  [9-25] 15 (08/20 0800) BP: (98-112)/(85-86) 101/85 (08/20 0800) SpO2:  [97 %-98 %] 98 % (08/20 0800) Weight:  [184 lb 14.4 oz (83.9 kg)] 184 lb 14.4 oz (83.9 kg) (08/20 0500) Last BM Date: 06/22/17 Mean arterial Pressure 80-90s    Intake/Output:   Intake/Output Summary (Last 24 hours) at 06/23/17 0829 Last data filed at 06/23/17 0800  Gross per 24 hour  Intake             1743 ml  Output             2105 ml  Net             -362 ml     Physical Exam    GENERAL: Sitting on EOB. NAD.  HEENT: Normal. NECK: Supple, JVP ~11-12 cm. Carotids OK.  CARDIAC:  Mechanical heart sounds with LVAD hum present.  LUNGS:  CTAB, normal effort.  ABDOMEN:  NT, ND, no HSM. No bruits or masses. +BS  LVAD exit site: Well-healed and incorporated. Dressing dry and intact. No erythema or drainage. Stabilization device present and accurately applied. Driveline dressing changed daily per sterile technique. EXTREMITIES:  Warm and dry. No cyanosis,  clubbing, rash, or edema. Skin cancer sore on R shin.  NEUROLOGIC:  Alert & oriented x 3. Cranial nerves grossly intact. Moves all 4 extremities w/o difficulty. Affect pleasant      Telemetry   Personally reviewed, Afib 70-80s   Labs   Basic Metabolic Panel:  Recent Labs Lab 06/19/17 0400  06/20/17 0400 06/20/17 0401 06/20/17 1729 06/21/17 0430 06/22/17 0437 06/23/17 0545  NA 132*  < >  --  136 135 135 134* 133*  K 3.4*  < >  --  3.4* 3.8 3.4* 4.0 3.9  CL 96*  < >  --  100* 102 101 101 98*  CO2 27  < >  --  28 26 26 23 25   GLUCOSE 231*  < >  --  110* 260* 93 142* 51*  BUN 42*  < >  --  29* 25* 38* 50* 60*  CREATININE 2.96*  < >  --  2.37* 2.30* 3.36* 4.47* 5.39*  CALCIUM 8.5*  < >  --  8.6* 8.3* 8.5* 8.5* 8.7*  MG 2.0  --  2.3  --   --  2.2 2.1 1.8  PHOS 2.4*  < >  --  2.0* 2.3* 2.9 4.2 5.1*  < > = values in this interval not displayed.  Liver Function Tests:  Recent Labs Lab 06/20/17 0401 06/20/17 1729 06/21/17 0430 06/22/17 0437 06/23/17 0545  ALBUMIN 3.4* 3.4* 3.2* 3.2* 3.5   No results for input(s): LIPASE, AMYLASE in the last 168 hours. No results for input(s): AMMONIA in the last 168 hours.  CBC:  Recent Labs Lab 06/20/17 0400 06/21/17 0430 06/22/17 0437 06/22/17 1930 06/23/17 0545  WBC 18.4* 14.7* 15.4* 16.4* 18.5*  HGB 8.7* 7.9* 7.7* 8.5* 8.6*  HCT 25.9* 24.0* 23.5* 26.2* 26.6*  MCV 92.8 95.2 94.8 93.6 92.7  PLT 113* 92* 87* 82* 84*    INR:  Recent Labs Lab 06/19/17 0400 06/20/17 0400 06/21/17 0430 06/22/17 0437 06/23/17 0545  INR 1.72 1.43 1.31 1.23 1.22    Other results:  EKG:    Imaging   No results found.   Medications:     Scheduled Medications: . atorvastatin  40 mg Oral Daily  . budesonide  0.25 mg Nebulization BID  . busPIRone  5 mg Oral BID  . Chlorhexidine Gluconate Cloth  6 each Topical Daily  . darbepoetin (ARANESP) injection - NON-DIALYSIS  150 mcg Subcutaneous Q Fri-1800  . docusate sodium  100 mg Oral  BID  . furosemide  160 mg Oral TID  . gabapentin  300 mg Oral QHS  . hydrALAZINE  37.5 mg Oral Q8H  . insulin aspart  0-15 Units Subcutaneous TID WC  . insulin aspart  0-5 Units Subcutaneous QHS  . insulin glargine  20 Units Subcutaneous QHS  . levothyroxine  25 mcg Oral QAC breakfast  . multivitamin  1 tablet Oral QHS  . neomycin-bacitracin-polymyxin   Topical Daily  . pantoprazole  40 mg Oral Q1200  . rOPINIRole  0.25 mg Oral QHS  . sevelamer carbonate  2.4 g Oral TID WC  . sildenafil  40 mg Oral TID  . sodium chloride flush  10-40 mL Intracatheter Q12H  . ascorbic acid  1,000 mg Oral Q1200    Infusions: . sodium chloride    . sodium chloride    . sodium chloride 10 mL/hr at 06/22/17 1200  . heparin 1,300 Units/hr (06/23/17 0800)    PRN Medications: sodium chloride, sodium chloride, acetaminophen, albuterol, alteplase, heparin, heparin, lidocaine (PF), lidocaine-prilocaine, ondansetron (ZOFRAN) IV, oxyCODONE, pentafluoroprop-tetrafluoroeth, sodium chloride flush, traMADol, traZODone   Patient Profile   Tori Cupps Murphyis a 71 y.o.malewith a history of CAD s/p CABG x 4 2010, OSA, AS with TAVR 2015, CKD, DM2, paroxysmal atrial fibrillation, asthma, and ischemic cardiomyopathy with Medtronic ICD.   He admitted with marked volume overload and AKI despite with increase in outpatient diuretics.    Assessment/Plan:    1. Acute on chronic systolic CHF: Ischemic cardiomyopathy, EF 20-25% with RV dysfunction on 2/18 echo pre-LVAD. 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 Medtronic ICD. Overall, he has felt much better post-LVAD. However, he developed AKI and was eventually admitted in 5/18 after RHC showed elevated filling pressures. He was diuresed and creatinine came down. Since then, we have adjusted up and down his torsemide dose, with higher dose volume improves but creatinine worsens. With lower dose we can make creatinine look better but he gets volume overloaded.  He has again  developed worsening volume overload with AKI, creatinine up to 5.79.  He had RHC on 8/10 as above and milrinone 0.125 started, increased to 0.25 on 8/11.  Speed increased to 9600 on 8/10.   - Main issue is severe RV failure - CVVHD stopped 8/17.  Milrinone stopped.  IV lasix started 8/18. - Volume status going back up and not clearing his creatinine. Plan to trial on iHD today.  - No ASA with h/o GI bleeding => hold warfarin for now in case procedure needed, cover with heparin gtt while INR subtherapeutic. D/w PharmD today.  2. CAD: s/p CABG - Denies CP. Continue atorvastatin. No change.  3. AKI on CKD stage IV:  - CVVHD stopped 8/17. IV lasix started 8/18.  - Volume status initially responded well to IV lasix off CVVHD, but now trending back up and not filtering. To trial on iHD today.  - Creatinine 2.3->3.3 -> 4.5 -> 5.34. Continue to follow trend.   - Would not d/c until we are sure we can maintain volume status on po diuretics.  - Will need VVS consult for access. Will need tunneled catheter then likely AV graft (fistula would likely not mature given his hemodynamics).  4. Atrial fibrillation: Chronic. Warfarin held for now for access procedures, on heparin gtt.  - Rate stable. No change.    - INR 1.22. Will need coumadin again once disposition made.  5. RV failure: Continue Revatio 40 mg TID.  Now off milrinone.  - Management of volume status as above 6. HTN:  - MAPs 80-90s  - Amlodipine stopped by renal, he remains on hydralazine.  7. Anemia: Baseline anemia of renal disease/chronic disease but recent bleeding from colonic AVMs. No overt bleeding, may be primarily anemia of renal disease at this point. FOBT negative.  - Hgb stable at 8.6 after 1 uPRBCs yesterday. Bleeding from Trialysis cath has resolved with suturing of cath site.  8. 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 - No overt bleeding. Suspect related to anemia of renal disease. FOBT negative  this admission.  - Continue octreotide injections. 9. Skin cancer: On neck and RLE. Needs excision, does not have to stop warfarin. Will address further as outpatient. No change.   - WOC have assessed biopsy sites  10. Thrombocytopenia: Chronic, mild, stable.   To trial on iHD today.  Options for dialysis may be limited, and regardless of response, we will soon need to have a candid talk about patients life expectancy. May be best to get VVS consult first to talk about barriers to dialisys access (lenght of maturation with continuous flow VAD)  I reviewed the LVAD parameters from today, and compared the results to the patient's prior recorded data.  No programming changes were made.  The LVAD is functioning within specified parameters.  The patient performs LVAD self-test daily.  LVAD interrogation was negative for any significant power changes, alarms or PI events/speed drops.  LVAD equipment check completed and is in good working order.  Back-up equipment present.   LVAD education done on emergency procedures and precautions and reviewed exit site care.  Length of Stay: [address]  Annamaria Helling 06/23/2017, 8:29 AM  VAD Team --- VAD ISSUES ONLY--- Pager [phone] (7am - 7am)  Advanced Heart Failure Team  Pager [phone] (M-F; 7a - 4p)  Please contact [facility] for night-coverage after hours (4p -7a ) and weekends on [email or web address]   Patient seen with PA, agree with the above note.  Creatinine rising, making urine but not clearing solute.  He will need IHD.  Plan for first session today.  He will need tunneled catheter.  Will need  to discuss long-term access with renal, fistula probably would not mature given his hemodynamics (axial flow LVAD).    Off amlodipine per renal pre-HD.  He remains on hydralazine, MAP around 90 (goal 70-90).   He is on heparin gtt while off warfarin.   Hemoglobin and plts stable.   Loralie Champagne 06/23/2017 9:03 AM

## 2017-06-23 NOTE — Progress Notes (Signed)
CARDIAC REHAB PHASE I   PRE:  Rate/Rhythm: 74 afib    BP: sitting 96 dopplar    SaO2:   MODE:  Ambulation: 410 ft   POST:  Rate/Rhythm: 108 afib    BP: sitting 96 dopplar     SaO2:    Pt steady pushing IV pole. No c/o except general fatigued. Rest briefly x2. Return to room and VSS. Pt left on batteries so he could wash up. Will f/u. Pt thankful to walk. [phone]   [place], ACSM 06/23/2017 10:34 AM

## 2017-06-24 ENCOUNTER — Encounter (HOSPITAL_COMMUNITY): Payer: Self-pay | Admitting: *Deleted

## 2017-06-24 DIAGNOSIS — Z95811 Presence of heart assist device: Secondary | ICD-10-CM

## 2017-06-24 DIAGNOSIS — I5022 Chronic systolic (congestive) heart failure: Secondary | ICD-10-CM

## 2017-06-24 LAB — RENAL FUNCTION PANEL
ALBUMIN: 3.4 g/dL — AB (ref 3.5–5.0)
ANION GAP: 9 (ref 5–15)
BUN: 34 mg/dL — ABNORMAL HIGH (ref 6–20)
CALCIUM: 8.4 mg/dL — AB (ref 8.9–10.3)
CO2: 27 mmol/L (ref 22–32)
CREATININE: 3.78 mg/dL — AB (ref 0.61–1.24)
Chloride: 97 mmol/L — ABNORMAL LOW (ref 101–111)
GFR calc non Af Amer: 15 mL/min — ABNORMAL LOW (ref >60)
GFR, EST AFRICAN AMERICAN: 17 mL/min — AB (ref >60)
Glucose, Bld: 191 mg/dL — ABNORMAL HIGH (ref 65–99)
Phosphorus: 3.2 mg/dL (ref 2.5–4.6)
Potassium: 3.7 mmol/L (ref 3.5–5.1)
SODIUM: 133 mmol/L — AB (ref 135–145)

## 2017-06-24 LAB — LACTATE DEHYDROGENASE: LDH: 272 U/L — ABNORMAL HIGH (ref 98–192)

## 2017-06-24 LAB — GLUCOSE, CAPILLARY
GLUCOSE-CAPILLARY: 114 mg/dL — AB (ref 65–99)
Glucose-Capillary: 152 mg/dL — ABNORMAL HIGH (ref 65–99)
Glucose-Capillary: 185 mg/dL — ABNORMAL HIGH (ref 65–99)
Glucose-Capillary: 297 mg/dL — ABNORMAL HIGH (ref 65–99)

## 2017-06-24 LAB — PROTIME-INR
INR: 1.27
Prothrombin Time: 15.9 seconds — ABNORMAL HIGH (ref 11.4–15.2)

## 2017-06-24 LAB — CBC
HCT: 26.1 % — ABNORMAL LOW (ref 39.0–52.0)
Hemoglobin: 8.5 g/dL — ABNORMAL LOW (ref 13.0–17.0)
MCH: 30.6 pg (ref 26.0–34.0)
MCHC: 32.6 g/dL (ref 30.0–36.0)
MCV: 93.9 fL (ref 78.0–100.0)
PLATELETS: 85 10*3/uL — AB (ref 150–400)
RBC: 2.78 MIL/uL — ABNORMAL LOW (ref 4.22–5.81)
RDW: 18.1 % — ABNORMAL HIGH (ref 11.5–15.5)
WBC: 14.2 10*3/uL — ABNORMAL HIGH (ref 4.0–10.5)

## 2017-06-24 LAB — PARATHYROID HORMONE, INTACT (NO CA): PTH: 121 pg/mL — AB (ref 15–65)

## 2017-06-24 LAB — HEPATITIS B CORE ANTIBODY, TOTAL: HEP B C TOTAL AB: NEGATIVE

## 2017-06-24 LAB — HEPATITIS B SURFACE ANTIGEN: Hepatitis B Surface Ag: NEGATIVE

## 2017-06-24 LAB — HEPARIN LEVEL (UNFRACTIONATED): Heparin Unfractionated: 0.34 IU/mL (ref 0.30–0.70)

## 2017-06-24 LAB — HEPATITIS B SURFACE ANTIBODY, QUANTITATIVE

## 2017-06-24 MED ORDER — HYDRALAZINE HCL 10 MG PO TABS
10.0000 mg | ORAL_TABLET | Freq: Three times a day (TID) | ORAL | Status: DC
  Administered 2017-06-24 – 2017-07-03 (×27): 10 mg via ORAL
  Filled 2017-06-24 (×28): qty 1

## 2017-06-24 MED ORDER — FUROSEMIDE 80 MG PO TABS
160.0000 mg | ORAL_TABLET | Freq: Two times a day (BID) | ORAL | Status: DC
  Administered 2017-06-25 – 2017-07-03 (×17): 160 mg via ORAL
  Filled 2017-06-24 (×17): qty 2

## 2017-06-24 NOTE — Progress Notes (Signed)
Inpatient Diabetes Program Recommendations  AACE/ADA: New Consensus Statement on Inpatient Glycemic Control (2015)  Target Ranges:  Prepandial:   less than 140 mg/dL      Peak postprandial:   less than 180 mg/dL (1-2 hours)      Critically ill patients:  140 - 180 mg/dL   Lab Results  Component Value Date   GLUCAP 114 (H) 06/24/2017   HGBA1C 6.7 (H) 03/17/2017    Review of Glycemic Control Results for DESEAN, HEEMSTRA (MRN [id number]) as of 06/24/2017 09:41  Ref. Range 06/23/2017 07:54 06/23/2017 11:37 06/23/2017 16:39 06/23/2017 20:41 06/24/2017 08:17  Glucose-Capillary Latest Ref Range: 65 - 99 mg/dL 94 280 (H) 76 169 (H) 114 (H)   Inpatient Diabetes Program Recommendations: Noted hypoglycemia post Novolog correction. Please consider decrease in Novolog correction to sensitive scale.  Thank you, Nani Gasser. Yuniel Blaney, RN, MSN, CDE  Diabetes Coordinator Inpatient Glycemic Control Team Team Pager [phone] (8am-5pm) 06/24/2017 9:47 AM

## 2017-06-24 NOTE — Progress Notes (Signed)
Patient ID: Trevor [name], male   DOB: 1946-01-10, 71 y.o.   MRN: [id number]    Advanced Heart Failure VAD Team Note  Subjective:    Started on CVVHD 06/17/17 via temporary cath.   CVVHD stopped 8/17. IV lasix started 8/18.   Tolerated iHD 8/20   Yesterday tolerated iHD Weight down another 1 pound.   Overall feeling ok. Denies SOB.   Studies: RHC (06/13/17):  RA = 17 RV = 58/18 PA = 56/25 (37) PCW = 20 (v = 35) Fick cardiac output/index = 6.4/3.2 Thermo CO/CI = 4.1/2.1 PVR = 2.6 Ao sat = 98% PA sat = 56%, 60% Increased LVAD speed to 9600   PCW down to 19  LVAD INTERROGATION:  HeartMate II LVAD:  Flow 5.8 liters/min, speed 9600, power 6.4, PI 3.3  4 PI events over night.   Objective:    Vital Signs:   Temp:  [97.8 F (36.6 C)-98.5 F (36.9 C)] 97.8 F (36.6 C) (08/21 0818) Pulse Rate:  [61-76] 76 (08/20 1800) Resp:  [10-26] 17 (08/21 0600) BP: (89-137)/(59-85) 89/79 (08/21 0505) SpO2:  [97 %-99 %] 98 % (08/21 0400) Weight:  [178 lb 12.7 oz (81.1 kg)-184 lb 14.4 oz (83.9 kg)] 179 lb 0.2 oz (81.2 kg) (08/21 0500) Last BM Date: 06/22/17 Mean arterial Pressure 80-90s     Intake/Output:   Intake/Output Summary (Last 24 hours) at 06/24/17 0828 Last data filed at 06/24/17 0600  Gross per 24 hour  Intake              646 ml  Output             4350 ml  Net            -3704 ml     Physical Exam    Physical Exam: CVP 9. Personally checked.  GENERAL: Well appearing, male . Sitting on the side of the bed.  HEENT: normal  NECK: Supple, JVP  9-10.  2+ bilaterally, no bruits.  No lymphadenopathy or thyromegaly appreciated.  [facility] HD cath CARDIAC:  Mechanical heart sounds with LVAD hum present.  LUNGS:  Clear to auscultation bilaterally.  ABDOMEN:  Soft, round, nontender, positive bowel sounds x4.     LVAD exit site: well-healed and incorporated.  Dressing dry and intact.  No erythema or drainage.  Stabilization device present and accurately applied.  Driveline dressing  is being changed daily per sterile technique. EXTREMITIES:  Warm and dry, no cyanosis, clubbing, rash or edema. RUE PICC NEUROLOGIC:  Alert and oriented x 4.  Gait steady.  No aphasia.  No dysarthria.  Affect pleasant.        Telemetry   Personally reviewed A fib 70s   Labs   Basic Metabolic Panel:  Recent Labs Lab 06/19/17 0400  06/20/17 0400  06/20/17 1729 06/21/17 0430 06/22/17 0437 06/23/17 0545 06/24/17 0500  NA 132*  < >  --   < > 135 135 134* 133* 133*  K 3.4*  < >  --   < > 3.8 3.4* 4.0 3.9 3.7  CL 96*  < >  --   < > 102 101 101 98* 97*  CO2 27  < >  --   < > 26 26 23 25 27   GLUCOSE 231*  < >  --   < > 260* 93 142* 51* 191*  BUN 42*  < >  --   < > 25* 38* 50* 60* 34*  CREATININE 2.96*  < >  --   < >  2.30* 3.36* 4.47* 5.39* 3.78*  CALCIUM 8.5*  < >  --   < > 8.3* 8.5* 8.5* 8.7* 8.4*  MG 2.0  --  2.3  --   --  2.2 2.1 1.8  --   PHOS 2.4*  < >  --   < > 2.3* 2.9 4.2 5.1* 3.2  < > = values in this interval not displayed.  Liver Function Tests:  Recent Labs Lab 06/20/17 1729 06/21/17 0430 06/22/17 0437 06/23/17 0545 06/24/17 0500  ALBUMIN 3.4* 3.2* 3.2* 3.5 3.4*   No results for input(s): LIPASE, AMYLASE in the last 168 hours. No results for input(s): AMMONIA in the last 168 hours.  CBC:  Recent Labs Lab 06/21/17 0430 06/22/17 0437 06/22/17 1930 06/23/17 0545 06/24/17 0500  WBC 14.7* 15.4* 16.4* 18.5* 14.2*  HGB 7.9* 7.7* 8.5* 8.6* 8.5*  HCT 24.0* 23.5* 26.2* 26.6* 26.1*  MCV 95.2 94.8 93.6 92.7 93.9  PLT 92* 87* 82* 84* 85*    INR:  Recent Labs Lab 06/20/17 0400 06/21/17 0430 06/22/17 0437 06/23/17 0545 06/24/17 0500  INR 1.43 1.31 1.23 1.22 1.27    Other results:  EKG:    Imaging   No results found.   Medications:     Scheduled Medications: . atorvastatin  40 mg Oral Daily  . budesonide  0.25 mg Nebulization BID  . busPIRone  5 mg Oral BID  . Chlorhexidine Gluconate Cloth  6 each Topical Daily  . darbepoetin (ARANESP)  injection - NON-DIALYSIS  150 mcg Subcutaneous Q Fri-1800  . docusate sodium  100 mg Oral BID  . furosemide  160 mg Oral TID  . gabapentin  300 mg Oral QHS  . hydrALAZINE  10 mg Oral Q8H  . insulin aspart  0-15 Units Subcutaneous TID WC  . insulin aspart  0-5 Units Subcutaneous QHS  . insulin glargine  20 Units Subcutaneous QHS  . levothyroxine  25 mcg Oral QAC breakfast  . multivitamin  1 tablet Oral QHS  . neomycin-bacitracin-polymyxin   Topical Daily  . pantoprazole  40 mg Oral Q1200  . rOPINIRole  0.25 mg Oral QHS  . sevelamer carbonate  2.4 g Oral TID WC  . sildenafil  40 mg Oral TID  . sodium chloride flush  10-40 mL Intracatheter Q12H    Infusions: . sodium chloride    . sodium chloride    . sodium chloride Stopped (06/23/17 1909)  . heparin 1,300 Units/hr (06/23/17 2257)    PRN Medications: sodium chloride, sodium chloride, acetaminophen, albuterol, alteplase, heparin, heparin, lidocaine (PF), lidocaine-prilocaine, ondansetron (ZOFRAN) IV, oxyCODONE, pentafluoroprop-tetrafluoroeth, sodium chloride flush, traMADol, traZODone   Patient Profile   Trevor Suen Murphyis a 71 y.o.malewith a history of CAD s/p CABG x 4 2010, OSA, AS with TAVR 2015, CKD, DM2, paroxysmal atrial fibrillation, asthma, and ischemic cardiomyopathy with Medtronic ICD.   He admitted with marked volume overload and AKI despite with increase in outpatient diuretics.    Assessment/Plan:    1. Acute on chronic systolic CHF: Ischemic cardiomyopathy, EF 20-25% with RV dysfunction on 2/18 echo pre-LVAD. 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 Medtronic ICD. Overall, he has felt much better post-LVAD. However, he developed AKI and was eventually admitted in 5/18 after RHC showed elevated filling pressures. He was diuresed and creatinine came down. Since then, we have adjusted up and down his torsemide dose, with higher dose volume improves but creatinine worsens. With lower dose we can make creatinine look better  but he gets volume overloaded.  He  has again developed worsening volume overload with AKI, creatinine up to 5.79.  He had RHC on 8/10 as above and milrinone 0.125 started, increased to 0.25 on 8/11.  Speed increased to 9600 on 8/10.   - Main issue is severe RV failure - CVVHD stopped 8/17.  Tolerated iHD.  Volume status improving. Weight down 18 pounds.  - No ASA with h/o GI bleeding => hold warfarin for now in case procedure needed, cover with heparin gtt while INR subtherapeutic.  2. CAD: s/p CABG - No CP.  Continue atorvastatin. No change.  3. AKI on CKD stage IV:  - CVVHD stopped 8/17. IV lasix started 8/18.  - Volume status initially responded well to IV lasix off CVVHD, but now trending back up and not filtering. Tolerated iHD. Creatinine improved after iHD.  Discussed with Dr Jimmy Footman. Plan to continue iHD. Consult VVS later this week. .  4. Atrial fibrillation: Chronic. Warfarin held for now for access procedures, on heparin gtt.  - Rate controlled.     - INR 1.27.  Will need coumadin again once disposition made.  5. RV failure: Continue Revatio 40 mg TID.  Now off milrinone.  iHD for volume control.  6. HTN:  - MAPs 80-90s  - Off amlodipine and hydralazine decreased.   7. Anemia: Baseline anemia of renal disease/chronic disease but recent bleeding from colonic AVMs. No overt bleeding, may be primarily anemia of renal disease at this point. FOBT negative.  -Hgb 8.5  8. 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 - No overt bleeding. Suspect related to anemia of renal disease. FOBT negative this admission.  - Continue octreotide injections. 9. Skin cancer: On neck and RLE. Needs excision, does not have to stop warfarin. Will address further as outpatient. No change.   - WOC have assessed biopsy sites  10. Thrombocytopenia: Chronic, mild, stable.   I reviewed the LVAD parameters from today, and compared the results to the patient's prior recorded  data.  No programming changes were made.  The LVAD is functioning within specified parameters.  The patient performs LVAD self-test daily.  LVAD interrogation was negative for any significant power changes, alarms or PI events/speed drops.  LVAD equipment check completed and is in good working order.  Back-up equipment present.   LVAD education done on emergency procedures and precautions and reviewed exit site care.  Length of Stay: [place], NP 06/24/2017, 8:28 AM  VAD Team --- VAD ISSUES ONLY--- Pager [phone] (7am - 7am)  Advanced Heart Failure Team  Pager [phone] (M-F; 7a - 4p)  Please contact [facility] for night-coverage after hours (4p -7a ) and weekends on [email or web address]   Patient seen with NP, agree with the above note.  Trevor [name] did well with HD yesterday.  Weight coming down and volume status looks considerably better on exam.  I suspect that he is going to need HD long-term.  Will keep on heparin with plans for tunneled catheter and VVS will see this week for permanent access planning.    Loralie Champagne 06/24/2017 12:27 PM

## 2017-06-24 NOTE — Progress Notes (Signed)
LVAD Coordinator Rounding Note:  Admitted 06/13/17 due to RV failure, marked volume overload.   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 Darcey Nora under Destination Therapy criteria.  Pt is concerned about a small hematoma that he noticed during dialysis yesterday on his left lower arm. [name] is aware.   Vital signs: HR: 77 Doppler Pressure: 86 Automatic BP: 95/68 O2 Sat: 98% on RA Wt in lbs: [phone]  LVAD interrogation reveals:   Speed:9600 Flow: 5.5 Power:  6.3w PI: 3.9 Alarms: none Events: 5 PI events on 8/20 Fixed speed: 9600 Low speed limit: 9000  Drive Line: [place] dressing dry and intact; needs weekly dressing changes. Next dressing change is due today 8/27.   Labs:  LDH trend: 317>270>295>255>271>293>304>297>260>272  INR trend: 2.15>2.3>2.04>1.94>1.79>1.66>1.95>1.72>1.43>1.27  Creat trend:  4.79>5.80>5.73>5.79>6.08>6.56>6.73>6.74>4.84>3.43>2.96>2.50>2.37>5.39>3.78  Hgb trend: 7.4>6.5>8.6>7.9>7.9>8.4>8.5>8.7>8.6>8.5  WBC trend: 10.7>13.3>11.0>11.3>17.5>18.4>18.5>14.2  Blood Transfusion: 06/14/17 - 2 units PCs  Renal: -temporary dialysis catheter placed 06/17/17 -CVVH initiated 06/17/17 stopped 8/18 -HD started 06/23/17   Gtts: Heparin 1300 u/hr  Anticoagulation Plan: -INR Goal:  2.0 - 2.5 - warfarin on hold -ASA Dose: 81 mg daily - on hold  Device: -Medtronic single lead -Therapies: on   Plan/Recommendations:   1. We will work on outpatient plan for pts hemo.  2. Please page VAD coordinator for equipment issues.   [name], BSN  VAD Coordinator [phone] 24/7 pager

## 2017-06-24 NOTE — Progress Notes (Signed)
ANTICOAGULATION CONSULT NOTE  Pharmacy Consult for heparin Indication: LVAD  No Known Allergies  Patient Measurements: Height: 5\' 5"  (165.1 cm) Weight: 179 lb 0.2 oz (81.2 kg) IBW/kg (Calculated) : 61.5 Heparin Dosing Weight = 80.7 kg  Vital Signs: Temp: 97.8 F (36.6 C) (08/21 0818) Temp Source: Oral (08/21 0818) BP: 89/79 (08/21 0505)  Labs:  Recent Labs  06/22/17 0437 06/22/17 0844 06/22/17 1930 06/23/17 0545 06/24/17 0500  HGB 7.7*  --  8.5* 8.6* 8.5*  HCT 23.5*  --  26.2* 26.6* 26.1*  PLT 87*  --  82* 84* 85*  LABPROT 15.6*  --   --  15.5* 15.9*  INR 1.23  --   --  1.22 1.27  HEPARINUNFRC  --  0.42  --  0.35 0.34  CREATININE 4.47*  --   --  5.39* 3.78*    Estimated Creatinine Clearance: 17.6 mL/min (A) (by C-G formula based on SCr of 3.78 mg/dL (H)).   Medical History: Past Medical History:  Diagnosis Date  . AICD (automatic cardioverter/defibrillator) present   . Asthma   . CHF (congestive heart failure) ([place])   . Diabetes ([place])   . Kidney disease   . Presence of permanent cardiac pacemaker    AICD  . Sleep apnea    Assessment: 70 y.o.malewith a history of CAD s/p CABG x 4 2010, OSA, AS with TAVR 2015, CKD, DM2, paroxysmal atrial fibrillation, asthma, and ischemic cardiomyopathy with Medtronic ICD.   INR 1.2 today holding warfarin, hgb improved 8.6 after PRBC yesterday. Heparin level 0.34  continues to be at goal on 1300 units/hr. LDH stable 260 and plt count low stable. Continue Heparin drip until HD access placed then can restart warfarin.  Goal of Therapy:  INR goal 2-2.5  Heparin Level 0.3-0.5 Monitor platelets by anticoagulation protocol: Yes   Plan:  -Continue heparin at 1300 units/hr. -Monitor heparin level, CBC, S/Sx bleeding daily. -F/u plans for [facility] and  Resume Coumadin   [name] Pharm.D. CPP, BCPS Clinical Pharmacist [phone] 06/24/2017 8:41 AM

## 2017-06-24 NOTE — Progress Notes (Signed)
Discharge Summary  Patient Details  Name: Trevor [name] MRN: [id number] Date of Birth: May 19, 1946 Referring Provider:     CARDIAC REHAB PHASE II ORIENTATION from 04/03/2017 in [place]  Referring Provider  Trevor Champagne MD       Number of Visits: 25  Reason for Discharge:  Early Exit:  due to current hospitalization  Smoking History:  History  Smoking Status  . Never Smoker  Smokeless Tobacco  . Never Used    Diagnosis:  12/13/16 LVAD (left ventricular assist device) present ([place])  Heart failure, chronic systolic (H[facility])  ADL UCSD:   Initial Exercise Prescription:   Discharge Exercise Prescription (Final Exercise Prescription Changes):     Exercise Prescription Changes - 06/13/17 1400      Response to Exercise   Blood Pressure (Admit) 110/60   Blood Pressure (Exercise) 114/62   Blood Pressure (Exit) 125/78   Heart Rate (Admit) 79 bpm   Heart Rate (Exercise) 93 bpm   Heart Rate (Exit) 69 bpm   Rating of Perceived Exertion (Exercise) 12   Duration Progress to 45 minutes of aerobic exercise without signs/symptoms of physical distress   Intensity THRR unchanged     Progression   Progression Continue to progress workloads to maintain intensity without signs/symptoms of physical distress.   Average METs 2.6     Resistance Training   Training Prescription Yes   Weight 5lb   Reps 10-15     NuStep   Level 4   Minutes 10   METs 2.8     Arm Ergometer   Level 1   Watts 15   Minutes 10   METs 2.62     Track   Laps 8   Minutes 10   METs 2.4     Home Exercise Plan   Plans to continue exercise at Home (comment)   Frequency Add 3 additional days to program exercise sessions.      Functional Capacity:   Psychological, QOL, Others - Outcomes: PHQ 2/9: Depression screen [facility] 2/9 04/07/2017 01/17/2017  Decreased Interest 0 0  Down, Depressed, Hopeless 0 0  PHQ - 2 Score 0 0    Quality of Life:   Personal  Goals: Goals established at orientation with interventions provided to work toward goal.    Personal Goals Discharge:   Nutrition & Weight - Outcomes:      Post Biometrics - 06/09/17 1038       Post  Biometrics   Weight 197 lb 5 oz (89.5 kg)   BMI (Calculated) 32.83      Nutrition:     Nutrition Therapy & Goals - 05/02/17 1116      Nutrition Therapy   Diet Carb Modified, Therapeutic Lifestyle Changes     Personal Nutrition Goals   Nutrition Goal Wt loss of 1-2 lb/week to a wt loss goal of 6-24 lb at graduation from [place].      Intervention Plan   Intervention Prescribe, educate and counsel regarding individualized specific dietary modifications aiming towards targeted core components such as weight, hypertension, lipid management, diabetes, heart failure and other comorbidities.   Expected Outcomes Short Term Goal: Understand basic principles of dietary content, such as calories, fat, sodium, cholesterol and nutrients.;Long Term Goal: Adherence to prescribed nutrition plan.      Nutrition Discharge:     Nutrition Assessments - 05/02/17 1116      MEDFICTS Scores   Pre Score 58      Education Questionnaire  Score:   Trevor [name] graduated from cardiac rehab program today with completion of 25 exercise sessions in Phase II. Pt maintained good attendance. Trevor [name] is currently in the hospital and will not be able to return at this time due to his current medical/ cardiac issues. Trevor [name] enjoyed participation in the program until his recent issues with weight gain/ fluid overload. Trevor [name]'s vital signs/map were stable while he participated in the program.Trevor [name] [place], RN,BSN 07/23/2017 4:38 PM

## 2017-06-24 NOTE — Progress Notes (Signed)
Subjective: Interval History: has no complaint.  Objective: Vital signs in last 24 hours: Temp:  [97.8 F (36.6 C)-98.5 F (36.9 C)] 97.9 F (36.6 C) (08/20 2300) Pulse Rate:  [61-76] 76 (08/20 1800) Resp:  [10-26] 17 (08/21 0600) BP: (89-137)/(59-85) 89/79 (08/21 0505) SpO2:  [97 %-99 %] 98 % (08/21 0400) Weight:  [81.1 kg (178 lb 12.7 oz)-83.9 kg (184 lb 14.4 oz)] 81.2 kg (179 lb 0.2 oz) (08/21 0500) Weight change: 0 kg (0 lb)  Intake/Output from previous day: 08/20 0701 - 08/21 0700 In: 725 [P.O.:360; I.V.:299; IV Piggyback:66] Out: 5883 [Urine:2350] Intake/Output this shift: No intake/output data recorded.  General appearance: alert, cooperative, no distress and pale Neck: L Ij cath Resp: L Ij cath Cardio: cont hum GI: soft, liver down 5 cm  Extremities: edema 1+,  and dressing R calf Resp decreased bs, rales in bases.  Lab Results:  Recent Labs  06/23/17 0545 06/24/17 0500  WBC 18.5* 14.2*  HGB 8.6* 8.5*  HCT 26.6* 26.1*  PLT 84* 85*   BMET:  Recent Labs  06/23/17 0545 06/24/17 0500  NA 133* 133*  K 3.9 3.7  CL 98* 97*  CO2 25 27  GLUCOSE 51* 191*  BUN 60* 34*  CREATININE 5.39* 3.78*  CALCIUM 8.7* 8.4*   No results for input(s): PTH in the last 72 hours. Iron Studies: No results for input(s): IRON, TIBC, TRANSFERRIN, FERRITIN in the last 72 hours.  Studies/Results: No results found.  I have reviewed the patient's current medications.  Assessment/Plan: 1 CKD4/AKI  Still no GFR. Nonoliguric.  Vol ok.  2 bp lower and stop hydral 3 DM controlled 4 CM with LVAD 5 afib NSR 6 anemia esa/Fe 7 HPTH P pre HD chem, lower Hydral, cont lasix, esa.     LOS: 11 days   Gregary Blackard L 06/24/2017,8:17 AM

## 2017-06-25 ENCOUNTER — Encounter (HOSPITAL_COMMUNITY): Admission: RE | Admit: 2017-06-25 | Payer: Medicare Other | Source: Ambulatory Visit

## 2017-06-25 DIAGNOSIS — N185 Chronic kidney disease, stage 5: Secondary | ICD-10-CM

## 2017-06-25 DIAGNOSIS — I509 Heart failure, unspecified: Secondary | ICD-10-CM

## 2017-06-25 LAB — CBC
HCT: 26.2 % — ABNORMAL LOW (ref 39.0–52.0)
Hemoglobin: 8.5 g/dL — ABNORMAL LOW (ref 13.0–17.0)
MCH: 30.4 pg (ref 26.0–34.0)
MCHC: 32.4 g/dL (ref 30.0–36.0)
MCV: 93.6 fL (ref 78.0–100.0)
PLATELETS: 78 10*3/uL — AB (ref 150–400)
RBC: 2.8 MIL/uL — ABNORMAL LOW (ref 4.22–5.81)
RDW: 17.8 % — AB (ref 11.5–15.5)
WBC: 14.8 10*3/uL — AB (ref 4.0–10.5)

## 2017-06-25 LAB — PROTIME-INR
INR: 1.23
PROTHROMBIN TIME: 15.6 s — AB (ref 11.4–15.2)

## 2017-06-25 LAB — RENAL FUNCTION PANEL
ALBUMIN: 3.5 g/dL (ref 3.5–5.0)
Anion gap: 9 (ref 5–15)
BUN: 44 mg/dL — AB (ref 6–20)
CALCIUM: 8.7 mg/dL — AB (ref 8.9–10.3)
CO2: 26 mmol/L (ref 22–32)
CREATININE: 4.7 mg/dL — AB (ref 0.61–1.24)
Chloride: 96 mmol/L — ABNORMAL LOW (ref 101–111)
GFR, EST AFRICAN AMERICAN: 13 mL/min — AB (ref >60)
GFR, EST NON AFRICAN AMERICAN: 11 mL/min — AB (ref >60)
Glucose, Bld: 62 mg/dL — ABNORMAL LOW (ref 65–99)
PHOSPHORUS: 4.2 mg/dL (ref 2.5–4.6)
Potassium: 3.6 mmol/L (ref 3.5–5.1)
Sodium: 131 mmol/L — ABNORMAL LOW (ref 135–145)

## 2017-06-25 LAB — GLUCOSE, CAPILLARY
GLUCOSE-CAPILLARY: 58 mg/dL — AB (ref 65–99)
GLUCOSE-CAPILLARY: 84 mg/dL (ref 65–99)
GLUCOSE-CAPILLARY: 160 mg/dL — AB (ref 65–99)
GLUCOSE-CAPILLARY: 245 mg/dL — AB (ref 65–99)
Glucose-Capillary: 215 mg/dL — ABNORMAL HIGH (ref 65–99)

## 2017-06-25 LAB — HEPARIN LEVEL (UNFRACTIONATED)
HEPARIN UNFRACTIONATED: 0.23 [IU]/mL — AB (ref 0.30–0.70)
HEPARIN UNFRACTIONATED: 0.53 [IU]/mL (ref 0.30–0.70)

## 2017-06-25 LAB — PARATHYROID HORMONE, INTACT (NO CA): PTH: 176 pg/mL — ABNORMAL HIGH (ref 15–65)

## 2017-06-25 LAB — LACTATE DEHYDROGENASE: LDH: 251 U/L — ABNORMAL HIGH (ref 98–192)

## 2017-06-25 MED ORDER — HEPARIN SODIUM (PORCINE) 1000 UNIT/ML DIALYSIS: 1000.0000 [IU] | INTRAMUSCULAR | Status: DC | PRN

## 2017-06-25 MED ORDER — SODIUM CHLORIDE 0.9 % IV SOLN: 100.0000 mL | INTRAVENOUS | Status: DC | PRN

## 2017-06-25 MED ORDER — ALTEPLASE 2 MG IJ SOLR: 2.0000 mg | Freq: Once | INTRAMUSCULAR | Status: DC | PRN

## 2017-06-25 MED ORDER — LIDOCAINE-PRILOCAINE 2.5-2.5 % EX CREA
1.0000 | TOPICAL_CREAM | CUTANEOUS | Status: DC | PRN
Start: 2017-06-25 — End: 2017-06-27
  Filled 2017-06-25: qty 5

## 2017-06-25 MED ORDER — HEPARIN SODIUM (PORCINE) 1000 UNIT/ML DIALYSIS
100.0000 [IU]/kg | INTRAMUSCULAR | Status: DC | PRN
  Filled 2017-06-25: qty 9

## 2017-06-25 MED ORDER — INSULIN GLARGINE 100 UNIT/ML ~~LOC~~ SOLN
15.0000 [IU] | Freq: Every day | SUBCUTANEOUS | Status: DC
  Administered 2017-06-25 – 2017-06-27 (×2): 15 [IU] via SUBCUTANEOUS
  Filled 2017-06-25 (×3): qty 0.15

## 2017-06-25 MED ORDER — PENTAFLUOROPROP-TETRAFLUOROETH EX AERO
1.0000 | INHALATION_SPRAY | CUTANEOUS | Status: DC | PRN
Start: 2017-06-25 — End: 2017-06-27

## 2017-06-25 MED ORDER — LIDOCAINE HCL (PF) 1 % IJ SOLN: 5.0000 mL | INTRAMUSCULAR | Status: DC | PRN

## 2017-06-25 NOTE — Progress Notes (Signed)
CARDIAC REHAB PHASE I   PRE:  Rate/Rhythm: 74 a fib  BP:  Sitting: 98 MAP        SaO2: 98 RA  MODE:  Ambulation: 370 ft   POST:  Rate/Rhythm: 83 a fib  BP:  Sitting: 98 MAP         SaO2: 99 RA  Pt ambulated 370 ft on RA, IV, independent, steady gait, tolerated well with no complaints. MAP somewhat elevated. Pt appreciative of walk, states he hopes to walk two more times today. Pt requested to stay on batteries. Pt to recliner after walk, call bell within reach. Will follow.   [phone] Lenna Sciara, RN, BSN 06/25/2017 9:06 AM

## 2017-06-25 NOTE — Progress Notes (Signed)
Patient ID: Trevor [name], male   DOB: February 23, 1946, 71 y.o.   MRN: [id number]    Advanced Heart Failure VAD Team Note  Subjective:    Started on CVVHD 06/17/17 via temporary cath.   CVVHD stopped 8/17.   Tolerated iHD 8/20   Yesterday he continued on high dose lasix. Weight and creatinine trending up.   Denies SOB. Complaining of cough.    Studies: RHC (06/13/17):  RA = 17 RV = 58/18 PA = 56/25 (37) PCW = 20 (v = 35) Fick cardiac output/index = 6.4/3.2 Thermo CO/CI = 4.1/2.1 PVR = 2.6 Ao sat = 98% PA sat = 56%, 60% Increased LVAD speed to 9600   PCW down to 19  LVAD INTERROGATION:  HeartMate II LVAD:  Flow 55.8 liters/min, speed 9600, power 6.3, PI 4.9  4 PI events   Objective:    Vital Signs:   Temp:  [97.3 F (36.3 C)-98.4 F (36.9 C)] 97.9 F (36.6 C) (08/22 0357) Pulse Rate:  [72-79] 72 (08/22 0400) Resp:  [11-18] 13 (08/22 0600) BP: (93-105)/(68-89) 105/83 (08/21 2000) SpO2:  [97 %-99 %] 98 % (08/22 0400) Weight:  [183 lb 1.6 oz (83.1 kg)] 183 lb 1.6 oz (83.1 kg) (08/22 0553) Last BM Date: 06/24/17 (per patient ) Mean arterial Pressure 80-90s     Intake/Output:   Intake/Output Summary (Last 24 hours) at 06/25/17 0809 Last data filed at 06/25/17 0600  Gross per 24 hour  Intake          1236.93 ml  Output             1725 ml  Net          -488.07 ml     Physical Exam     Physical Exam: CVp 18-19  GENERAL: NAD in bed.  HEENT: normal  NECK: Supple, JVP  .  2+ bilaterally, no bruits.  No lymphadenopathy or thyromegaly appreciated.   CARDIAC:  Mechanical heart sounds with LVAD hum present. LIJ subclavian HD cath LUNGS: Decreased in the bases.  ABDOMEN:  Soft, round, nontender, positive bowel sounds x4.     LVAD exit site: well-healed and incorporated.  Dressing dry and intact.  No erythema or drainage.  Stabilization device present and accurately applied.  Driveline dressing is being changed daily per sterile technique. EXTREMITIES:  Warm and dry, no  cyanosis, clubbing, rash or edema  NEUROLOGIC:  Alert and oriented x 4.  Gait steady.  No aphasia.  No dysarthria.  Affect pleasant.      Telemetry   A fib  70s personally reviewed.   Labs   Basic Metabolic Panel:  Recent Labs Lab 06/19/17 0400  06/20/17 0400  06/21/17 0430 06/22/17 0437 06/23/17 0545 06/24/17 0500 06/25/17 0426  NA 132*  < >  --   < > 135 134* 133* 133* 131*  K 3.4*  < >  --   < > 3.4* 4.0 3.9 3.7 3.6  CL 96*  < >  --   < > 101 101 98* 97* 96*  CO2 27  < >  --   < > 26 23 25 27 26   GLUCOSE 231*  < >  --   < > 93 142* 51* 191* 62*  BUN 42*  < >  --   < > 38* 50* 60* 34* 44*  CREATININE 2.96*  < >  --   < > 3.36* 4.47* 5.39* 3.78* 4.70*  CALCIUM 8.5*  < >  --   < >  8.5* 8.5* 8.7* 8.4* 8.7*  MG 2.0  --  2.3  --  2.2 2.1 1.8  --   --   PHOS 2.4*  < >  --   < > 2.9 4.2 5.1* 3.2 4.2  < > = values in this interval not displayed.  Liver Function Tests:  Recent Labs Lab 06/21/17 0430 06/22/17 0437 06/23/17 0545 06/24/17 0500 06/25/17 0426  ALBUMIN 3.2* 3.2* 3.5 3.4* 3.5   No results for input(s): LIPASE, AMYLASE in the last 168 hours. No results for input(s): AMMONIA in the last 168 hours.  CBC:  Recent Labs Lab 06/22/17 0437 06/22/17 1930 06/23/17 0545 06/24/17 0500 06/25/17 0426  WBC 15.4* 16.4* 18.5* 14.2* 14.8*  HGB 7.7* 8.5* 8.6* 8.5* 8.5*  HCT 23.5* 26.2* 26.6* 26.1* 26.2*  MCV 94.8 93.6 92.7 93.9 93.6  PLT 87* 82* 84* 85* 78*    INR:  Recent Labs Lab 06/21/17 0430 06/22/17 0437 06/23/17 0545 06/24/17 0500 06/25/17 0426  INR 1.31 1.23 1.22 1.27 1.23    Other results:  EKG:    Imaging   No results found.   Medications:     Scheduled Medications: . atorvastatin  40 mg Oral Daily  . budesonide  0.25 mg Nebulization BID  . busPIRone  5 mg Oral BID  . Chlorhexidine Gluconate Cloth  6 each Topical Daily  . darbepoetin (ARANESP) injection - NON-DIALYSIS  150 mcg Subcutaneous Q Fri-1800  . docusate sodium  100 mg  Oral BID  . furosemide  160 mg Oral BID  . gabapentin  300 mg Oral QHS  . hydrALAZINE  10 mg Oral Q8H  . insulin aspart  0-15 Units Subcutaneous TID WC  . insulin aspart  0-5 Units Subcutaneous QHS  . insulin glargine  20 Units Subcutaneous QHS  . levothyroxine  25 mcg Oral QAC breakfast  . multivitamin  1 tablet Oral QHS  . neomycin-bacitracin-polymyxin   Topical Daily  . pantoprazole  40 mg Oral Q1200  . rOPINIRole  0.25 mg Oral QHS  . sevelamer carbonate  2.4 g Oral TID WC  . sildenafil  40 mg Oral TID  . sodium chloride flush  10-40 mL Intracatheter Q12H    Infusions: . sodium chloride    . sodium chloride    . sodium chloride Stopped (06/23/17 1909)  . heparin 1,400 Units/hr (06/25/17 0504)    PRN Medications: sodium chloride, sodium chloride, acetaminophen, albuterol, alteplase, heparin, heparin, lidocaine (PF), lidocaine-prilocaine, ondansetron (ZOFRAN) IV, oxyCODONE, pentafluoroprop-tetrafluoroeth, sodium chloride flush, traMADol, traZODone   Patient Profile   Jujuan Dugo Murphyis a 71 y.o.malewith a history of CAD s/p CABG x 4 2010, OSA, AS with TAVR 2015, CKD, DM2, paroxysmal atrial fibrillation, asthma, and ischemic cardiomyopathy with Medtronic ICD.   He admitted with marked volume overload and AKI despite with increase in outpatient diuretics.    Assessment/Plan:    1. Acute on chronic systolic CHF: Ischemic cardiomyopathy, EF 20-25% with RV dysfunction on 2/18 echo pre-LVAD. 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 Medtronic ICD. Overall, he has felt much better post-LVAD. However, he developed AKI and was eventually admitted in 5/18 after RHC showed elevated filling pressures. He was diuresed and creatinine came down. Since then, we have adjusted up and down his torsemide dose, with higher dose volume improves but creatinine worsens. With lower dose we can make creatinine look better but he gets volume overloaded.  He has again developed worsening volume overload with  AKI, creatinine up to 5.79.  He had RHC on  8/10 as above and milrinone 0.125 started, increased to 0.25 on 8/11.  Speed increased to 9600 on 8/10.   - Main issue is severe RV failure - CVVHD stopped 8/17.  Tolerated iHD.  Volume status trending up. .  - No ASA with h/o GI bleeding => hold warfarin for now in case procedure needed, cover with heparin gtt while INR  . 2. CAD: s/p CABG - No CP.  Continue atorvastatin. No change.  3. AKI on CKD stage IV:  - CVVHD stopped 8/17. IV lasix started 8/18.  - Volume status initially responded well to IV lasix off CVVHD, but now trending back up and not filtering. Tolerated iHD on 8/20. Creatinine improved after iHD but starting to rise.   Discussed with Dr Jimmy Footman. Plan to continue iHD. Consult VVS later this week.   4. Atrial fibrillation: Chronic. Warfarin held for now for access procedures, on heparin gtt.  - Rate controlled. Continue heparin.      - INR 1.23  Will need coumadin again once all access issues are cleared up.  5. RV failure: Continue Revatio 40 mg TID.  Now off milrinone.  iHD for volume control.  6. HTN:  - MAPs 80s  - Off amlodipine. Remains on hydralazine 10 mg every 8 hours.    7. Anemia: Baseline anemia of renal disease/chronic disease but recent bleeding from colonic AVMs. No overt bleeding, may be primarily anemia of renal disease at this point. FOBT negative.  -Hgb 8.5  8. 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 - No overt bleeding. Suspect related to anemia of renal disease. FOBT negative this admission.  - Continue octreotide injections. 9. Skin cancer: On neck and RLE. Needs excision, does not have to stop warfarin. Will address further as outpatient. No change.  WOC assessed biopsy sites  10. Thrombocytopenia: Chronic, mild, stable.  11. Uncontrolled Diabetes- episodes of hypoglycemia in the morning.  Cut back lantus 15 unit at bed time.   I reviewed the LVAD parameters from today,  and compared the results to the patient's prior recorded data.  No programming changes were made.  The LVAD is functioning within specified parameters.  The patient performs LVAD self-test daily.  LVAD interrogation was negative for any significant power changes, alarms or PI events/speed drops.  LVAD equipment check completed and is in good working order.  Back-up equipment present.   LVAD education done on emergency procedures and precautions and reviewed exit site care.  Length of Stay: [place], NP 06/25/2017, 8:09 AM  VAD Team --- VAD ISSUES ONLY--- Pager [phone] (7am - 7am)  Advanced Heart Failure Team  Pager [phone] (M-F; 7a - 4p)  Please contact [facility] for night-coverage after hours (4p -7a ) and weekends on [email or web address]   Patient seen with NP, agree with the above note.  Weight is up despite high dose Lasix.  It does not appear that he is going to manage without HD.  Plan for HD today.  Nephrology to arrange more permanent access.  Will continue heparin gtt until this is done.    Walking halls, feels good, overall stable.   Loralie Champagne 06/25/2017 8:32 AM

## 2017-06-25 NOTE — Progress Notes (Signed)
ANTICOAGULATION CONSULT NOTE  Pharmacy Consult for Heparin Indication: LVAD  No Known Allergies  Patient Measurements: Height: 5\' 5"  (165.1 cm) Weight: 179 lb 0.2 oz (81.2 kg) IBW/kg (Calculated) : 61.5 Heparin Dosing Weight = 80.7 kg  Vital Signs: Temp: 97.9 F (36.6 C) (08/22 0357) Temp Source: Oral (08/22 0357) BP: 105/83 (08/21 2000) Pulse Rate: 72 (08/22 0400)  Labs:  Recent Labs  06/23/17 0545 06/24/17 0500 06/25/17 0426  HGB 8.6* 8.5* 8.5*  HCT 26.6* 26.1* 26.2*  PLT 84* 85* 78*  LABPROT 15.5* 15.9*  --   INR 1.22 1.27  --   HEPARINUNFRC 0.35 0.34 0.23*  CREATININE 5.39* 3.78*  --     Estimated Creatinine Clearance: 17.6 mL/min (A) (by C-G formula based on SCr of 3.78 mg/dL (H)).   Medical History: Past Medical History:  Diagnosis Date  . AICD (automatic cardioverter/defibrillator) present   . Asthma   . CHF (congestive heart failure) ([place])   . Diabetes ([place])   . Kidney disease   . Presence of permanent cardiac pacemaker    AICD  . Sleep apnea    Assessment: 70 y.o.malewith a history of CAD s/p CABG x 4 2010, OSA, AS with TAVR 2015, CKD, DM2, paroxysmal atrial fibrillation, asthma, and ischemic cardiomyopathy with Medtronic ICD.   Heparin level low at 0.23 this AM. Hgb remains low/stable.   Goal of Therapy:  Heparin Level 0.3-0.5 Monitor platelets by anticoagulation protocol: Yes   Plan:  -Inc heparin to 1400 units/hr -1200 HL  Narda Bonds, PharmD, BCPS Clinical Pharmacist Phone: [phone]

## 2017-06-25 NOTE — Consult Note (Signed)
Referring Physician: Dr Deterding  Patient name: Trevor [name] MRN: [id number] DOB: Mar 26, 1946 Sex: male  REASON FOR CONSULT: hemodialysis access  HPI: Trevor [name] is a 71 y.o. male with heart failure now with declining renal function.  He has a functioning LVAD.  We are asked to provide long term hemodialysis access.  He is CKD 5.  He is right handed.  He was admitted with anasarca which has improve.  He has chronic afib and is currently on heparin pending his access placement. He is currently on a MWF dialysis schedule.  Past Medical History:  Diagnosis Date  . AICD (automatic cardioverter/defibrillator) present   . Asthma   . CHF (congestive heart failure) ([place])   . Diabetes ([place])   . Kidney disease   . Presence of permanent cardiac pacemaker    AICD  . Sleep apnea    Past Surgical History:  Procedure Laterality Date  . CARDIAC CATHETERIZATION N/A 12/02/2016   Procedure: Right Heart Cath;  Surgeon: Larey Dresser, MD;  Location: [facility];  Service: Cardiovascular;  Laterality: N/A;  . COLONOSCOPY N/A 02/14/2017   Procedure: COLONOSCOPY;  Surgeon: Milus Banister, MD;  Location: [facility];  Service: Endoscopy;  Laterality: N/A;  . IABP INSERTION N/A 12/11/2016   Procedure: IABP Insertion;  Surgeon: Larey Dresser, MD;  Location: [facility];  Service: Cardiovascular;  Laterality: N/A;  . INSERTION OF IMPLANTABLE LEFT VENTRICULAR ASSIST DEVICE N/A 12/13/2016   Procedure: INSERTION OF IMPLANTABLE LEFT VENTRICULAR ASSIST DEVICE;  Surgeon: Ivin Poot, MD;  Location: [facility];  Service: Open Heart Surgery;  Laterality: N/A;  HEARTMATE II  NITRIC OXIDE  . IR FLUORO GUIDE CV LINE LEFT  06/17/2017  . IR US GUIDE VASC ACCESS LEFT  06/17/2017  . RIGHT HEART CATH N/A 12/11/2016   Procedure: Right Heart Cath;  Surgeon: Larey Dresser, MD;  Location: [facility];  Service: Cardiovascular;  Laterality: N/A;  . RIGHT HEART CATH N/A 03/27/2017   Procedure: Right  Heart Cath;  Surgeon: Larey Dresser, MD;  Location: [facility];  Service: Cardiovascular;  Laterality: N/A;  . RIGHT HEART CATH N/A 06/13/2017   Procedure: RIGHT HEART CATH;  Surgeon: Jolaine Artist, MD;  Location: [facility] [place] [facility];  Service: Cardiovascular;  Laterality: N/A;  . TEE WITHOUT CARDIOVERSION N/A 12/13/2016   Procedure: TRANSESOPHAGEAL ECHOCARDIOGRAM (TEE);  Surgeon: Ivin Poot, MD;  Location: [facility];  Service: Open Heart Surgery;  Laterality: N/A;  . TRICUSPID VALVE REPLACEMENT N/A 12/13/2016   Procedure: TRICUSPID VALVE REPAIR WITH EDWARDS MC 3 TRICUSPID ANNULOPLASTY RING MODEL 4900 SIZE T 28;  Surgeon: Ivin Poot, MD;  Location: [facility];  Service: Open Heart Surgery;  Laterality: N/A;    Family History  Problem Relation Age of Onset  . Heart failure Father   . Heart attack Father   . Healthy Mother   . Diabetes Paternal Grandfather     SOCIAL HISTORY: Social History   Social History  . Marital status: Widowed    Spouse name: N/A  . Number of children: 0  . Years of education: 14   Occupational History  . Not on file.   Social History Main Topics  . Smoking status: Never Smoker  . Smokeless tobacco: Never Used  . Alcohol use No  . Drug use: No  . Sexual activity: Not Currently   Other Topics Concern  . Not on file  Social History Narrative   Patient is a widow. Moved to [place] from [place], [place]. Currently lives with his sister Dekendrick Uzelac.    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     No Known Allergies  Current Facility-Administered Medications  Medication Dose Route Frequency Provider Last Rate Last Dose  . 0.9 %  sodium chloride infusion  100 mL Intravenous PRN Donato Heinz, MD      . 0.9 %  sodium chloride infusion  100 mL Intravenous PRN Donato Heinz, MD      . 0.9 %  sodium chloride infusion  100 mL Intravenous PRN Deterding, Jeneen Rinks, MD      . 0.9 %  sodium chloride infusion  100 mL  Intravenous PRN Deterding, Jeneen Rinks, MD      . acetaminophen (TYLENOL) tablet 650 mg  650 mg Oral Q4H PRN Bensimhon, Shaune Pascal, MD   650 mg at 06/19/17 2119  . albuterol (PROVENTIL) (2.5 MG/3ML) 0.083% nebulizer solution 3 mL  3 mL Inhalation Q4H PRN Shirley Friar, PA-C   3 mL at 06/19/17 2119  . alteplase (CATHFLO ACTIVASE) injection 2 mg  2 mg Intracatheter Once PRN Donato Heinz, MD      . alteplase (CATHFLO ACTIVASE) injection 2 mg  2 mg Intracatheter Once PRN Deterding, Jeneen Rinks, MD      . atorvastatin (LIPITOR) tablet 40 mg  40 mg Oral Daily Shirley Friar, PA-C   40 mg at 06/25/17 1438  . budesonide (PULMICORT) nebulizer solution 0.25 mg  0.25 mg Nebulization BID Shirley Friar, PA-C   0.25 mg at 06/25/17 8101  . busPIRone (BUSPAR) tablet 5 mg  5 mg Oral BID Shirley Friar, PA-C   5 mg at 06/24/17 2144  . Chlorhexidine Gluconate Cloth 2 % PADS 6 each  6 each Topical Daily Larey Dresser, MD   6 each at 06/25/17 1000  . Darbepoetin Alfa (ARANESP) injection 150 mcg  150 mcg Subcutaneous Q [id number] Jamal Maes, MD   150 mcg at 06/20/17 2055  . docusate sodium (COLACE) capsule 100 mg  100 mg Oral BID Shirley Friar, PA-C   100 mg at 06/24/17 2143  . furosemide (LASIX) tablet 160 mg  160 mg Oral BID Larey Dresser, MD   160 mg at 06/25/17 0759  . gabapentin (NEURONTIN) tablet 300 mg  300 mg Oral QHS Shirley Friar, PA-C   300 mg at 06/24/17 2144  . heparin ADULT infusion 100 units/mL (25000 units/2103mL sodium chloride 0.45%)  1,400 Units/hr Intravenous Continuous Erenest Blank, RPH 14 mL/hr at 06/25/17 1440 1,400 Units/hr at 06/25/17 1440  . heparin injection 1,000 Units  1,000 Units Dialysis PRN Donato Heinz, MD      . heparin injection 1,000 Units  1,000 Units Dialysis PRN Deterding, Jeneen Rinks, MD      . heparin injection 1,600 Units  20 Units/kg Dialysis PRN Donato Heinz, MD      . heparin injection 8,400 Units  100  Units/kg Dialysis PRN Deterding, Jeneen Rinks, MD      . hydrALAZINE (APRESOLINE) tablet 10 mg  10 mg Oral Q8H Mauricia Area, MD   10 mg at 06/25/17 1438  . insulin aspart (novoLOG) injection 0-15 Units  0-15 Units Subcutaneous TID WC Shirley Friar, PA-C   3 Units at 06/24/17 1705  . insulin aspart (novoLOG) injection 0-5 Units  0-5 Units Subcutaneous QHS Shirley Friar, PA-C   3 Units at 06/24/17 2144  . insulin glargine (LANTUS)  injection 15 Units  15 Units Subcutaneous QHS Clegg, Amy D, NP      . levothyroxine (SYNTHROID, LEVOTHROID) tablet 25 mcg  25 mcg Oral QAC breakfast Shirley Friar, PA-C   25 mcg at 06/25/17 0759  . lidocaine (PF) (XYLOCAINE) 1 % injection 5 mL  5 mL Intradermal PRN Deterding, Jeneen Rinks, MD      . lidocaine-prilocaine (EMLA) cream 1 application  1 application Topical PRN Deterding, Jeneen Rinks, MD      . multivitamin (RENA-VIT) tablet 1 tablet  1 tablet Oral Nile Riggs, MD   1 tablet at 06/24/17 2143  . neomycin-bacitracin-polymyxin (NEOSPORIN) ointment   Topical Daily Larey Dresser, MD      . ondansetron [facility]) injection 4 mg  4 mg Intravenous Q6H PRN Bensimhon, Shaune Pascal, MD      . oxyCODONE (Oxy IR/ROXICODONE) immediate release tablet 5-10 mg  5-10 mg Oral Q8H PRN Shirley Friar, PA-C   10 mg at 06/20/17 1924  . pantoprazole (PROTONIX) EC tablet 40 mg  40 mg Oral Q1200 Shirley Friar, PA-C   40 mg at 06/25/17 1438  . pentafluoroprop-tetrafluoroeth (GEBAUERS) aerosol 1 application  1 application Topical PRN Donato Heinz, MD      . pentafluoroprop-tetrafluoroeth (GEBAUERS) aerosol 1 application  1 application Topical PRN Deterding, Jeneen Rinks, MD      . rOPINIRole (REQUIP) tablet 0.25 mg  0.25 mg Oral QHS Shirley Friar, PA-C   0.25 mg at 06/24/17 1953  . sevelamer carbonate (RENVELA) powder PACK 2.4 g  2.4 g Oral TID WC Mauricia Area, MD   2.4 g at 06/25/17 1200  . sildenafil (REVATIO) tablet 40 mg  40 mg Oral  TID Shirley Friar, PA-C   40 mg at 06/25/17 1437  . sodium chloride flush (NS) 0.9 % injection 10-40 mL  10-40 mL Intracatheter Q12H Larey Dresser, MD   10 mL at 06/24/17 2145  . sodium chloride flush (NS) 0.9 % injection 10-40 mL  10-40 mL Intracatheter PRN Larey Dresser, MD   10 mL at 06/20/17 1039  . traMADol (ULTRAM) tablet 50 mg  50 mg Oral Q6H PRN Larey Dresser, MD   50 mg at 06/23/17 2257  . traZODone (DESYREL) tablet 100 mg  100 mg Oral QHS PRN Larey Dresser, MD   100 mg at 06/23/17 2257   Facility-Administered Medications Ordered in Other Encounters  Medication Dose Route Frequency Provider Last Rate Last Dose  . 0.9 %  sodium chloride infusion   Intravenous Once Larey Dresser, MD        ROS:   General:  No weight loss, Fever, chills  HEENT: No recent headaches, no nasal bleeding, no visual changes, no sore throat  Neurologic: No dizziness, blackouts, seizures. No recent symptoms of stroke or mini- stroke. No recent episodes of slurred speech, or temporary blindness.  Cardiac: No recent episodes of chest pain/pressure, no shortness of breath at rest.  + shortness of breath with exertion.  + history of atrial fibrillation or irregular heartbeat  Vascular: No history of rest pain in feet.  No history of claudication.  No history of non-healing ulcer  Pulmonary:  No asthma or wheezing  Musculoskeletal:  [ ]  Arthritis, [ ]  Low back pain,  [ ]  Joint pain  Hematologic:No history of hypercoagulable state.  No history of easy bleeding.  + history of anemia  Gastrointestinal: No hematochezia or melena,  No gastroesophageal reflux, no trouble swallowing, history of GI bleeding  from AVMs but not active  Urinary: [X]  chronic Kidney disease, [X]  on HD - [X]  MWF or [ ]  TTHS, [ ]  Burning with urination, [ ]  Frequent urination, [ ]  Difficulty urinating;   Skin: No rashes  Psychological: No history of anxiety,  No history of depression   Physical  Examination  Vitals:   06/25/17 1300 06/25/17 1330 06/25/17 1400 06/25/17 1426  BP: 110/81 112/84 (!) 115/103 109/78  Pulse: 81 82 85 86  Resp: 14 15 16 16   Temp:    97.6 F (36.4 C)  TempSrc:    Oral  SpO2:    95%  Weight:    182 lb 1.6 oz (82.6 kg)  Height:        Body mass index is 30.3 kg/m.  General:  Alert and oriented, no acute distress HEENT: Normal Neck: No JVD Pulmonary: Clear to auscultation bilaterally Cardiac: Regular Rate on monitor, no peripheral pulse with LVAD Skin: No rash Extremity Pulses:  Absent brachial and radial pulse bilaterally Musculoskeletal: No deformity trace pretibial edema  Neurologic: Upper and lower extremity motor 5/5 and symmetric  DATA:  CBC    Component Value Date/Time   WBC 14.8 (H) 06/25/2017 0426   RBC 2.80 (L) 06/25/2017 0426   HGB 8.5 (L) 06/25/2017 0426   HCT 26.2 (L) 06/25/2017 0426   HCT 26.1 (L) 12/08/2016 0334   PLT 78 (L) 06/25/2017 0426   MCV 93.6 06/25/2017 0426   MCH 30.4 06/25/2017 0426   MCHC 32.4 06/25/2017 0426   RDW 17.8 (H) 06/25/2017 0426   LYMPHSABS 1.4 03/27/2017 1214   MONOABS 0.7 03/27/2017 1214   EOSABS 0.2 03/27/2017 1214   BASOSABS 0.0 03/27/2017 1214    BMET    Component Value Date/Time   NA 131 (L) 06/25/2017 0426   K 3.6 06/25/2017 0426   CL 96 (L) 06/25/2017 0426   CO2 26 06/25/2017 0426   GLUCOSE 62 (L) 06/25/2017 0426   BUN 44 (H) 06/25/2017 0426   CREATININE 4.70 (H) 06/25/2017 0426   CALCIUM 8.7 (L) 06/25/2017 0426   GFRNONAA 11 (L) 06/25/2017 0426   GFRAA 13 (L) 06/25/2017 0426     ASSESSMENT:  Pt needs long term hemodialysis access getting a working access may prove difficult with LVAD.  Fistula may not mature without pulsatile flow and unknown durability of grafts in this pt population.  Thrombocytopenia chronic.  Leukocytosis currently no source but may hold off on graft if not a fistula candidate   PLAN:  Will order vein map today.  Possible access later this week or early  next week depending on clinical condition and vein map.   Ruta Hinds, MD [facility] Office: [phone] Pager: [phone]

## 2017-06-25 NOTE — Progress Notes (Signed)
ANTICOAGULATION CONSULT NOTE  Pharmacy Consult for Heparin Indication: LVAD  No Known Allergies  Patient Measurements: Height: 5\' 5"  (165.1 cm) Weight: 182 lb 1.6 oz (82.6 kg) IBW/kg (Calculated) : 61.5 Heparin Dosing Weight = 80.7 kg  Vital Signs: Temp: 98.3 F (36.8 C) (08/22 1500) Temp Source: Oral (08/22 1500) BP: 78/65 (08/22 1633) Pulse Rate: 86 (08/22 1426)  Labs:  Recent Labs  06/23/17 0545 06/24/17 0500 06/25/17 0426 06/25/17 1800  HGB 8.6* 8.5* 8.5*  --   HCT 26.6* 26.1* 26.2*  --   PLT 84* 85* 78*  --   LABPROT 15.5* 15.9* 15.6*  --   INR 1.22 1.27 1.23  --   HEPARINUNFRC 0.35 0.34 0.23* 0.53  CREATININE 5.39* 3.78* 4.70*  --     Estimated Creatinine Clearance: 14.3 mL/min (A) (by C-G formula based on SCr of 4.7 mg/dL (H)).   Medical History: Past Medical History:  Diagnosis Date  . AICD (automatic cardioverter/defibrillator) present   . Asthma   . CHF (congestive heart failure) ([place])   . Diabetes ([place])   . Kidney disease   . Presence of permanent cardiac pacemaker    AICD  . Sleep apnea    Assessment: 70 y.o.malewith a history of CAD s/p CABG x 4 2010, OSA, AS with TAVR 2015, CKD, DM2, paroxysmal atrial fibrillation, asthma, and ischemic cardiomyopathy with Medtronic ICD.   Heparin level slightly higher than desired at 0.53 on heparin infusion at 1400 units/hr.   Goal of Therapy:  Heparin Level 0.3-0.5 Monitor platelets by anticoagulation protocol: Yes   Plan:  -Decrease heparin infusion to 1350 units/hr -Repeat heparin level with am labs   Vincenza Hews, PharmD, BCPS 06/25/2017, 7:30 PM

## 2017-06-25 NOTE — Progress Notes (Signed)
Subjective: Interval History: has no complaint,.ready for access  Objective: Vital signs in last 24 hours: Temp:  [97.3 F (36.3 C)-98.4 F (36.9 C)] 97.9 F (36.6 C) (08/22 0357) Pulse Rate:  [72-79] 72 (08/22 0400) Resp:  [11-18] 13 (08/22 0600) BP: (93-105)/(68-89) 105/83 (08/21 2000) SpO2:  [97 %-99 %] 98 % (08/22 0800) Weight:  [83.1 kg (183 lb 1.6 oz)] 83.1 kg (183 lb 1.6 oz) (08/22 0553) Weight change: -0.816 kg (-1 lb 12.8 oz)  Intake/Output from previous day: 08/21 0701 - 08/22 0700 In: 1502.9 [P.O.:1190; I.V.:312.9] Out: 1725 [Urine:1725] Intake/Output this shift: Total I/O In: 254 [P.O.:240; I.V.:14] Out: -   General appearance: alert, cooperative, no distress and pale Neck: LIJ cath Resp: diminished breath sounds bilaterally Cardio: cont hum GI: pos bs, liver down 5 cm Extremities: lesion on R leg  Lab Results:  Recent Labs  06/24/17 0500 06/25/17 0426  WBC 14.2* 14.8*  HGB 8.5* 8.5*  HCT 26.1* 26.2*  PLT 85* 78*   BMET:  Recent Labs  06/24/17 0500 06/25/17 0426  NA 133* 131*  K 3.7 3.6  CL 97* 96*  CO2 27 26  GLUCOSE 191* 62*  BUN 34* 44*  CREATININE 3.78* 4.70*  CALCIUM 8.4* 8.7*    Recent Labs  06/23/17 0900  PTH 121*   Iron Studies: No results for input(s): IRON, TIBC, TRANSFERRIN, FERRITIN in the last 72 hours.  Studies/Results: No results found.  I have reviewed the patient's current medications.  Assessment/Plan: 1 CKD5/ESRD some urine no clearance.  Will do HD, get access 2 CM LVAD but bps good 3 DM controlled 4 Anemia esa 5 Skin Ca P Hd, access, esa, mobilize    LOS: 12 days   Dak Szumski L 06/25/2017,8:25 AM

## 2017-06-25 NOTE — Progress Notes (Signed)
LVAD Coordinator Rounding Note:  Admitted 06/13/17 due to RV failure, marked volume overload.   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 Darcey Nora under Destination Therapy criteria.   Vital signs: HR: 77 AFib Doppler Pressure: 86 Automatic BP: 95/68 O2 Sat: 98% on RA Wt in lbs: [phone]  LVAD interrogation reveals:   Speed:9600 Flow: 5.5 Power:  6.1w PI: 4.5 Alarms: none Events:3 PI events overnight Fixed speed: 9600 Low speed limit: 9000  Drive Line: Sorbav[place] dressing dry and intact; needs weekly dressing changes. Next due 8/27.   Labs:  LDH trend: 317>270>295>255>271>293>304>297>260>272>251  INR trend: 2.15>2.3>2.04>1.94>1.79>1.66>1.95>1.72>1.43>1.27>1.23  Creat trend:  4.79>5.80>5.73>5.79>6.08>6.56>6.73>6.74>4.84>3.43>2.96>2.50>2.37>5.39>3.78>4.70  Hgb trend: 7.4>6.5>8.6>7.9>7.9>8.4>8.5>8.7>8.6>8.5>8.5  WBC trend: 10.7>13.3>11.0>11.3>17.5>18.4>18.5>14.2>14.8  Blood Transfusion: 06/14/17 - 2 units PRBCs 06/22/17- 1 unit PRBC  Renal: -temporary dialysis catheter placed 06/17/17 -CVVH initiated 06/17/17 stopped 8/18 -HD started 06/23/17, will have again today   Gtts: Heparin   Anticoagulation Plan: -INR Goal:  2.0 - 2.5 - warfarin on hold -ASA Dose: 81 mg daily - on hold  Device: -Medtronic single lead -Therapies: on   Plan/Recommendations:   1. Plan to arrange outpatient HD 2. Please page VAD coordinator for equipment issues or patient concerns.  Balinda Quails RN, VAD Coordinator 24/7 pager [phone]

## 2017-06-25 NOTE — Progress Notes (Signed)
Patient PI dropped as low as 2.1 and patient asymptomatic. VAD Coordinator called and notified. Verbal orders to page again if PI is less than 2 and patient gets asymptomatic. Will give patient a few cups of drink in order to try and increase PI. Will continue to monitor patient.

## 2017-06-26 ENCOUNTER — Inpatient Hospital Stay (HOSPITAL_COMMUNITY): Payer: Medicare Other

## 2017-06-26 DIAGNOSIS — Z01810 Encounter for preprocedural cardiovascular examination: Secondary | ICD-10-CM

## 2017-06-26 DIAGNOSIS — I5022 Chronic systolic (congestive) heart failure: Secondary | ICD-10-CM

## 2017-06-26 DIAGNOSIS — N186 End stage renal disease: Secondary | ICD-10-CM

## 2017-06-26 LAB — RENAL FUNCTION PANEL
ALBUMIN: 3.4 g/dL — AB (ref 3.5–5.0)
ANION GAP: 10 (ref 5–15)
BUN: 27 mg/dL — ABNORMAL HIGH (ref 6–20)
CALCIUM: 8.5 mg/dL — AB (ref 8.9–10.3)
CO2: 26 mmol/L (ref 22–32)
CREATININE: 3.76 mg/dL — AB (ref 0.61–1.24)
Chloride: 94 mmol/L — ABNORMAL LOW (ref 101–111)
GFR calc non Af Amer: 15 mL/min — ABNORMAL LOW (ref >60)
GFR, EST AFRICAN AMERICAN: 17 mL/min — AB (ref >60)
GLUCOSE: 210 mg/dL — AB (ref 65–99)
PHOSPHORUS: 2.9 mg/dL (ref 2.5–4.6)
Potassium: 3.5 mmol/L (ref 3.5–5.1)
SODIUM: 130 mmol/L — AB (ref 135–145)

## 2017-06-26 LAB — LACTATE DEHYDROGENASE: LDH: 235 U/L — AB (ref 98–192)

## 2017-06-26 LAB — CBC
HCT: 26.5 % — ABNORMAL LOW (ref 39.0–52.0)
HEMOGLOBIN: 8.4 g/dL — AB (ref 13.0–17.0)
MCH: 30.3 pg (ref 26.0–34.0)
MCHC: 31.7 g/dL (ref 30.0–36.0)
MCV: 95.7 fL (ref 78.0–100.0)
Platelets: 87 10*3/uL — ABNORMAL LOW (ref 150–400)
RBC: 2.77 MIL/uL — ABNORMAL LOW (ref 4.22–5.81)
RDW: 18.2 % — ABNORMAL HIGH (ref 11.5–15.5)
WBC: 13.6 10*3/uL — ABNORMAL HIGH (ref 4.0–10.5)

## 2017-06-26 LAB — GLUCOSE, CAPILLARY
GLUCOSE-CAPILLARY: 148 mg/dL — AB (ref 65–99)
GLUCOSE-CAPILLARY: 184 mg/dL — AB (ref 65–99)
Glucose-Capillary: 150 mg/dL — ABNORMAL HIGH (ref 65–99)
Glucose-Capillary: 175 mg/dL — ABNORMAL HIGH (ref 65–99)

## 2017-06-26 LAB — PROTIME-INR
INR: 1.23
Prothrombin Time: 15.6 seconds — ABNORMAL HIGH (ref 11.4–15.2)

## 2017-06-26 LAB — HEPARIN LEVEL (UNFRACTIONATED): Heparin Unfractionated: 0.36 IU/mL (ref 0.30–0.70)

## 2017-06-26 MED ORDER — CALCITRIOL 0.25 MCG PO CAPS
0.5000 ug | ORAL_CAPSULE | ORAL | Status: DC
  Administered 2017-06-26 – 2017-07-04 (×3): 0.5 ug via ORAL
  Filled 2017-06-26 (×4): qty 2

## 2017-06-26 NOTE — Addendum Note (Signed)
Addendum  created 06/26/17 1300 by Cintia Gleed, MD   Sign clinical note    

## 2017-06-26 NOTE — Progress Notes (Signed)
Left Upper Extremity Vein Map    Cephalic  Segment Diameter Depth Comment  1. Axilla 4.33mm 6.83mm   2. Mid upper arm 4.90mm 2.67mm   3. Above [facility] 5.57mm 3.64mm   4. In [facility] 5.5mm 3.68mm   5. Below AC 3.49mm 6.74mm   6. Mid forearm 3.23mm 4.82mm Branch  7. Wrist mm mm Not visualized due to arm bands   Basilic  Segment Diameter Depth Comment  1. Axilla 4.71mm 1.5mm Branch  2. Mid upper arm 4.72mm 9.24mm   3. Above AC 4.38mm 5.58mm   4. In [facility] 3.98mm 2.61mm   5. Below AC 3.3mm 2.64mm   6. Mid forearm mm mm Not visualized  7. Wrist mm mm Not visualized   All areas of the veins imaged are patent and compressible   Antonieta Pert, RDMS, RVT 06/26/2017 11:09

## 2017-06-26 NOTE — Progress Notes (Signed)
Patient ID: Trevor [name], male   DOB: [birth year], 71 y.o.   MRN: [id number]    Advanced Heart Failure VAD Team Note  Subjective:    Started on CVVHD 06/17/17 via temporary cath.   CVVHD stopped 8/17.   Tolerated iHD 8/20, 8/22  Yesterday tolerated iHD. Fatigued after treatment.   Denies SOB.   Studies: RHC (06/13/17):  RA = 17 RV = 58/18 PA = 56/25 (37) PCW = 20 (v = 35) Fick cardiac output/index = 6.4/3.2 Thermo CO/CI = 4.1/2.1 PVR = 2.6 Ao sat = 98% PA sat = 56%, 60% Increased LVAD speed to 9600   PCW down to 19  LVAD INTERROGATION:  HeartMate II LVAD:  Flow 5.3 liters/min, speed 9600, power 6.1, PI 5.7       4 PI events.    Objective:    Vital Signs:   Temp:  [97.3 F (36.3 C)-98.3 F (36.8 C)] 97.3 F (36.3 C) (08/23 0400) Pulse Rate:  [66-86] 75 (08/23 0400) Resp:  [12-28] 19 (08/23 0700) BP: (78-126)/(59-103) 78/65 (08/22 1633) SpO2:  [95 %-98 %] 97 % (08/23 0400) Weight:  [182 lb 1.6 oz (82.6 kg)-184 lb 4.9 oz (83.6 kg)] 183 lb 14.4 oz (83.4 kg) (08/23 0600) Last BM Date: 06/25/17 Mean arterial Pressure 80s      Intake/Output:   Intake/Output Summary (Last 24 hours) at 06/26/17 0733 Last data filed at 06/26/17 0700  Gross per 24 hour  Intake          1322.22 ml  Output             2000 ml  Net          -677.78 ml     Physical Exam      Physical Exam: CVP 15 GENERAL: Well appearing. Sitting on the side of the bed.  HEENT: normal  NECK: Supple, JVP to jaw .  2+ bilaterally, no bruits.  No lymphadenopathy or thyromegaly appreciated.   CARDIAC:  Mechanical heart sounds with LVAD hum present.  LUNGS:  Clear to auscultation bilaterally.  ABDOMEN:  Soft, round, nontender, positive bowel sounds x4.     LVAD exit site: well-healed and incorporated.  Dressing dry and intact.  No erythema or drainage.  Stabilization device present and accurately applied.  Driveline dressing is being changed daily per sterile technique. EXTREMITIES:  Warm and dry, no  cyanosis, clubbing, rash or edema.  NEUROLOGIC:  Alert and oriented x 4.  Gait steady.  No aphasia.  No dysarthria.  Affect pleasant.      Telemetry    A fib 70s personally reviewed  Labs   Basic Metabolic Panel:  Recent Labs Lab 06/20/17 0400  06/21/17 0430 06/22/17 0437 06/23/17 0545 06/24/17 0500 06/25/17 0426 06/26/17 0412  NA  --   < > 135 134* 133* 133* 131* 130*  K  --   < > 3.4* 4.0 3.9 3.7 3.6 3.5  CL  --   < > 101 101 98* 97* 96* 94*  CO2  --   < > 26 23 25 27 26 26   GLUCOSE  --   < > 93 142* 51* 191* 62* 210*  BUN  --   < > 38* 50* 60* 34* 44* 27*  CREATININE  --   < > 3.36* 4.47* 5.39* 3.78* 4.70* 3.76*  CALCIUM  --   < > 8.5* 8.5* 8.7* 8.4* 8.7* 8.5*  MG 2.3  --  2.2 2.1 1.8  --   --   --  PHOS  --   < > 2.9 4.2 5.1* 3.2 4.2 2.9  < > = values in this interval not displayed.  Liver Function Tests:  Recent Labs Lab 06/22/17 0437 06/23/17 0545 06/24/17 0500 06/25/17 0426 06/26/17 0412  ALBUMIN 3.2* 3.5 3.4* 3.5 3.4*   No results for input(s): LIPASE, AMYLASE in the last 168 hours. No results for input(s): AMMONIA in the last 168 hours.  CBC:  Recent Labs Lab 06/22/17 1930 06/23/17 0545 06/24/17 0500 06/25/17 0426 06/26/17 0412  WBC 16.4* 18.5* 14.2* 14.8* 13.6*  HGB 8.5* 8.6* 8.5* 8.5* 8.4*  HCT 26.2* 26.6* 26.1* 26.2* 26.5*  MCV 93.6 92.7 93.9 93.6 95.7  PLT 82* 84* 85* 78* 87*    INR:  Recent Labs Lab 06/22/17 0437 06/23/17 0545 06/24/17 0500 06/25/17 0426 06/26/17 0412  INR 1.23 1.22 1.27 1.23 1.23    Other results:  EKG:    Imaging   No results found.   Medications:     Scheduled Medications: . atorvastatin  40 mg Oral Daily  . budesonide  0.25 mg Nebulization BID  . busPIRone  5 mg Oral BID  . Chlorhexidine Gluconate Cloth  6 each Topical Daily  . darbepoetin (ARANESP) injection - NON-DIALYSIS  150 mcg Subcutaneous Q Fri-1800  . docusate sodium  100 mg Oral BID  . furosemide  160 mg Oral BID  . gabapentin   300 mg Oral QHS  . hydrALAZINE  10 mg Oral Q8H  . insulin aspart  0-15 Units Subcutaneous TID WC  . insulin aspart  0-5 Units Subcutaneous QHS  . insulin glargine  15 Units Subcutaneous QHS  . levothyroxine  25 mcg Oral QAC breakfast  . multivitamin  1 tablet Oral QHS  . neomycin-bacitracin-polymyxin   Topical Daily  . pantoprazole  40 mg Oral Q1200  . rOPINIRole  0.25 mg Oral QHS  . sevelamer carbonate  2.4 g Oral TID WC  . sildenafil  40 mg Oral TID  . sodium chloride flush  10-40 mL Intracatheter Q12H    Infusions: . sodium chloride    . sodium chloride    . sodium chloride    . sodium chloride    . heparin 1,350 Units/hr (06/26/17 0559)    PRN Medications: sodium chloride, sodium chloride, sodium chloride, sodium chloride, acetaminophen, albuterol, alteplase, alteplase, heparin, heparin, heparin, heparin, lidocaine (PF), lidocaine-prilocaine, ondansetron (ZOFRAN) IV, oxyCODONE, pentafluoroprop-tetrafluoroeth, pentafluoroprop-tetrafluoroeth, sodium chloride flush, traMADol, traZODone   Patient Profile   Trevor Reid Murphyis a 71 y.o.malewith a history of CAD s/p CABG x 4 2010, OSA, AS with TAVR 2015, CKD, DM2, paroxysmal atrial fibrillation, asthma, and ischemic cardiomyopathy with Medtronic ICD.   He admitted with marked volume overload and AKI despite with increase in outpatient diuretics.    Assessment/Plan:    1. Acute on chronic systolic CHF: Ischemic cardiomyopathy, EF 20-25% with RV dysfunction on 2/18 echo pre-LVAD. 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 Medtronic ICD. Overall, he has felt much better post-LVAD. However, he developed AKI and was eventually admitted in 5/18 after RHC showed elevated filling pressures. He was diuresed and creatinine came down. Since then, we have adjusted up and down his torsemide dose, with higher dose volume improves but creatinine worsens. With lower dose we can make creatinine look better but he gets volume overloaded.  He has again  developed worsening volume overload with AKI, creatinine up to 5.79.  He had RHC on 8/10 as above and milrinone 0.125 started, increased to 0.25 on 8/11.  Speed increased  to 9600 on 8/10.   - Main issue is severe RV failure. CVVHD stopped 8/17.  Tolerated iHD. VVS consulted for fistula versus graft.  - No ASA with h/o GI bleeding => hold warfarin for now in case procedure needed, cover with heparin gtt while INR subtherapeutic.  2. CAD: s/p CABG - No CP.  Continue atorvastatin. No change.  3. AKI on CKD stage IV:  - CVVHD stopped 8/17. IV lasix started 8/18.  - Tolerating iHD.   - VVS consulted for fistula versus graft, vein mapping today.   4. Atrial fibrillation: Chronic. Warfarin held for now for access procedures, on heparin gtt.  - Rate controlled. Continue heparin.      - INR 1.23.   Will need coumadin again once all access issues are cleared up.  5. RV failure: Continue Revatio 40 mg TID.  Now off milrinone.  iHD for volume control.  6. HTN:   Stable.  - Off amlodipine. Remains on hydralazine 10 mg every 8 hours.    7. Anemia: Baseline anemia of renal disease/chronic disease but recent bleeding from colonic AVMs. No overt bleeding, may be primarily anemia of renal disease at this point. FOBT negative.  -Hgb 8.4.  8. 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 - No overt bleeding. Suspect related to anemia of renal disease. FOBT negative this admission.  - Continue octreotide injections. 9. Skin cancer: On neck and RLE. Needs excision, does not have to stop warfarin. Will address further as outpatient. No change.  WOC assessed biopsy sites  10. Thrombocytopenia: Chronic, mild, stable.  11. Diabetes-  Continue lantus 15 unit at bed time.   Transfer to SDU  I reviewed the LVAD parameters from today, and compared the results to the patient's prior recorded data.  No programming changes were made.  The LVAD is functioning within specified parameters.  The  patient performs LVAD self-test daily.  LVAD interrogation was negative for any significant power changes, alarms or PI events/speed drops.  LVAD equipment check completed and is in good working order.  Back-up equipment present.   LVAD education done on emergency procedures and precautions and reviewed exit site care.  Length of Stay: [place], NP 06/26/2017, 7:33 AM  VAD Team --- VAD ISSUES ONLY--- Pager [phone] (7am - 7am)  Advanced Heart Failure Team  Pager [phone] (M-F; 7a - 4p)  Please contact [facility] for night-coverage after hours (4p -7a ) and weekends on [email or web address]   Patient seen with NP, agree with the above note.  He tolerated HD again yesterday, fatigued afterwards but fine this morning.  Walked 3 laps around unit yesterday.  VVS following, for vein mapping for access today.  Will need ongoing HD, home after access issues resolved outpatient HD set up.   He is off warfarin for now pending access, covering with IV heparin gtt.   Hemoglobin and plts stable.   Loralie Champagne 06/26/2017 8:06 AM

## 2017-06-26 NOTE — Progress Notes (Signed)
CARDIAC REHAB PHASE I   PRE:  Rate/Rhythm: 70 afib    BP: sitting 90    SaO2:   MODE:  Ambulation: 410 ft   POST:  Rate/Rhythm: 99 afib    BP: sitting 90      SaO2:   Pt eager to walk and tolerated well with slow pace. No c/o. To bed for nap. [place], ACSM 06/26/2017 2:17 PM

## 2017-06-26 NOTE — Progress Notes (Signed)
Right  Upper Extremity Vein Map    Cephalic  Segment Diameter Depth Comment  1. Axilla 5.52mm 8.44mm   2. Mid upper arm 4.79mm 96mm   3. Above AC 4.4mm 79mm   4. In [facility] 4.31mm 3.5mm   5. Below AC 4.33mm 8.27mm   6. Mid forearm 3.48mm 5.70mm   7. Wrist mm mm IV placement   Basilic  Segment Diameter Depth Comment  1. Axilla 8.3mm 5.41mm   2. Mid upper arm 5.62mm 9.46mm   3. Above AC 4.33mm 4.20mm   4. In AC 2.25mm 2.72mm Multiple branches  6. Mid forearm 3.95mm 1.70mm   7. Wrist mm mm IV Bandages   All areas of the veins imaged were patent and compressible.  Antonieta Pert, RDMS, RVT 06/26/2017 11:02

## 2017-06-26 NOTE — Progress Notes (Signed)
LVAD Coordinator Rounding Note:  Admitted 06/13/17 due to RV failure, marked volume overload.   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 Darcey Nora under Destination Therapy criteria.   Vital signs: HR: 69 AFib Doppler Pressure: 96 Automatic BP: 101/86 O2 Sat: 95% on RA Wt in lbs: [phone]  LVAD interrogation reveals:   Speed:9600 Flow: 6.1 Power:  6.5w PI: 3.8 Alarms: none Events: none Fixed speed: 9600 Low speed limit: 9000  Drive Line: Sorbaview dressing dry and intact; needs weekly dressing changes. Next due 8/27.   Labs:  LDH trend: 317>270>295>255>271>293>304>297>260>272>251>235  INR trend: 2.15>2.3>2.04>1.94>1.79>1.66>1.95>1.72>1.43>1.27>1.23  Creat trend:  4.79>5.80>5.73>5.79>6.08>6.56>6.73>6.74>4.84>3.43>2.96>2.50>2.37>5.39>3.78>4.70>3.76  Hgb trend: 7.4>6.5>8.6>7.9>7.9>8.4>8.5>8.7>8.6>8.5>8.5>8.4  WBC trend: 10.7>13.3>11.0>11.3>17.5>18.4>18.5>14.2>14.8>12.5>13.6  Blood Transfusion: 06/14/17 - 2 units PRBCs 06/22/17- 1 unit PRBC  Renal: -temporary dialysis catheter placed 06/17/17 -CVVH initiated 06/17/17 stopped 8/18 -HD started 06/23/17, will have again today   Gtts: Heparin   Anticoagulation Plan: -INR Goal:  2.0 - 2.5 - warfarin on hold -ASA Dose: 81 mg daily - on hold  Device: -Medtronic single lead -Therapies: on   Plan/Recommendations:   1. Plan to arrange outpatient HD 2. Pt will have vein mapping today. 3. Will transfer to stepdown when bed available. 4. Please page VAD coordinator for equipment issues or patient concerns.  [name], VAD Coordinator 24/7 pager [phone]

## 2017-06-26 NOTE — Progress Notes (Signed)
ANTICOAGULATION CONSULT NOTE  Pharmacy Consult for Heparin Indication: LVAD  No Known Allergies  Patient Measurements: Height: 5\' 5"  (165.1 cm) Weight: 182 lb 1.6 oz (82.6 kg) IBW/kg (Calculated) : 61.5 Heparin Dosing Weight = 80.7 kg  Vital Signs: Temp: 97.3 F (36.3 C) (08/23 0400) Temp Source: Oral (08/23 0400) Pulse Rate: 75 (08/23 0400)  Labs:  Recent Labs  06/24/17 0500 06/25/17 0426 06/25/17 1800 06/26/17 0412  HGB 8.5* 8.5*  --  8.4*  HCT 26.1* 26.2*  --  26.5*  PLT 85* 78*  --  87*  LABPROT 15.9* 15.6*  --  15.6*  INR 1.27 1.23  --  1.23  HEPARINUNFRC 0.34 0.23* 0.53 0.36  CREATININE 3.78* 4.70*  --  3.76*    Estimated Creatinine Clearance: 17.8 mL/min (A) (by C-G formula based on SCr of 3.76 mg/dL (H)).   Medical History: Past Medical History:  Diagnosis Date  . AICD (automatic cardioverter/defibrillator) present   . Asthma   . CHF (congestive heart failure) ([place])   . Diabetes ([place])   . Kidney disease   . Presence of permanent cardiac pacemaker    AICD  . Sleep apnea    Assessment: 71 y.o.malewith a history of CAD s/p CABG x 4 2010, OSA, AS with TAVR 2015, CKD, DM2, paroxysmal atrial fibrillation, asthma, and ischemic cardiomyopathy with Medtronic ICD.   Heparin level is 0.36- at goal after decrease to 1350 units/hr.  H/H low-stable. Platelets are up some at 87- chronic thrombocytopenia.    Goal of Therapy:  Heparin Level 0.3-0.5 Monitor platelets by anticoagulation protocol: Yes   Plan:  Continue heparin infusion to 1350 units/hr Daily heparin level and CBC.  Monitor for bleeding.   Sloan Leiter, PharmD, BCPS Clinical Pharmacist After hours, please call [phone] 06/26/2017, 5:37 AM

## 2017-06-26 NOTE — Progress Notes (Signed)
Subjective: Interval History: has no complaint .  Objective: Vital signs in last 24 hours: Temp:  [97.3 F (36.3 C)-98.3 F (36.8 C)] 97.3 F (36.3 C) (08/23 0400) Pulse Rate:  [66-86] 75 (08/23 0400) Resp:  [12-28] 19 (08/23 0700) BP: (78-126)/(59-103) 78/65 (08/22 1633) SpO2:  [95 %-98 %] 97 % (08/23 0400) Weight:  [82.6 kg (182 lb 1.6 oz)-83.6 kg (184 lb 4.9 oz)] 83.4 kg (183 lb 14.4 oz) (08/23 0600) Weight change: 0.546 kg (1 lb 3.3 oz)  Intake/Output from previous day: 08/22 0701 - 08/23 0700 In: 1322.2 [P.O.:992; I.V.:330.2] Out: 2000 [Urine:600] Intake/Output this shift: No intake/output data recorded.  General appearance: alert, cooperative, no distress and pale Neck: IJ cath Resp: clear to auscultation bilaterally Cardio: cont hum GI: LVD tubes RUQ Extremities: Dressing R Leg  Lab Results:  Recent Labs  06/25/17 0426 06/26/17 0412  WBC 14.8* 13.6*  HGB 8.5* 8.4*  HCT 26.2* 26.5*  PLT 78* 87*   BMET:  Recent Labs  06/25/17 0426 06/26/17 0412  NA 131* 130*  K 3.6 3.5  CL 96* 94*  CO2 26 26  GLUCOSE 62* 210*  BUN 44* 27*  CREATININE 4.70* 3.76*  CALCIUM 8.7* 8.5*    Recent Labs  06/23/17 0900 06/24/17 1100  PTH 121* 176*   Iron Studies: No results for input(s): IRON, TIBC, TRANSFERRIN, FERRITIN in the last 72 hours.  Studies/Results: No results found.  I have reviewed the patient's current medications.  Assessment/Plan: 1  CKD/ESRD did well on Hd.  For Mapping 2 Anemia esa 3 CM with LVAD 4 DM controlled 5 HPTH start vit D 6 Skin Ca P Hd MWF, access, vit D, esa    LOS: 13 days   Jaxin Fulfer L 06/26/2017,7:54 AM

## 2017-06-27 ENCOUNTER — Encounter (HOSPITAL_COMMUNITY): Payer: Medicare Other

## 2017-06-27 LAB — PROTIME-INR
INR: 1.21
Prothrombin Time: 15.4 seconds — ABNORMAL HIGH (ref 11.4–15.2)

## 2017-06-27 LAB — RENAL FUNCTION PANEL
ANION GAP: 12 (ref 5–15)
Albumin: 3.3 g/dL — ABNORMAL LOW (ref 3.5–5.0)
BUN: 38 mg/dL — ABNORMAL HIGH (ref 6–20)
CALCIUM: 8.9 mg/dL (ref 8.9–10.3)
CHLORIDE: 94 mmol/L — AB (ref 101–111)
CO2: 26 mmol/L (ref 22–32)
CREATININE: 5.03 mg/dL — AB (ref 0.61–1.24)
GFR, EST AFRICAN AMERICAN: 12 mL/min — AB (ref >60)
GFR, EST NON AFRICAN AMERICAN: 10 mL/min — AB (ref >60)
Glucose, Bld: 167 mg/dL — ABNORMAL HIGH (ref 65–99)
Phosphorus: 4.1 mg/dL (ref 2.5–4.6)
Potassium: 3.8 mmol/L (ref 3.5–5.1)
Sodium: 132 mmol/L — ABNORMAL LOW (ref 135–145)

## 2017-06-27 LAB — CBC
HEMATOCRIT: 25.9 % — AB (ref 39.0–52.0)
Hemoglobin: 8.2 g/dL — ABNORMAL LOW (ref 13.0–17.0)
MCH: 30.3 pg (ref 26.0–34.0)
MCHC: 31.7 g/dL (ref 30.0–36.0)
MCV: 95.6 fL (ref 78.0–100.0)
Platelets: 104 10*3/uL — ABNORMAL LOW (ref 150–400)
RBC: 2.71 MIL/uL — ABNORMAL LOW (ref 4.22–5.81)
RDW: 18.1 % — AB (ref 11.5–15.5)
WBC: 14.4 10*3/uL — AB (ref 4.0–10.5)

## 2017-06-27 LAB — GLUCOSE, CAPILLARY
GLUCOSE-CAPILLARY: 157 mg/dL — AB (ref 65–99)
GLUCOSE-CAPILLARY: 221 mg/dL — AB (ref 65–99)
Glucose-Capillary: 187 mg/dL — ABNORMAL HIGH (ref 65–99)
Glucose-Capillary: 187 mg/dL — ABNORMAL HIGH (ref 65–99)

## 2017-06-27 LAB — HEPARIN LEVEL (UNFRACTIONATED): Heparin Unfractionated: 0.33 IU/mL (ref 0.30–0.70)

## 2017-06-27 LAB — LACTATE DEHYDROGENASE: LDH: 235 U/L — ABNORMAL HIGH (ref 98–192)

## 2017-06-27 MED ORDER — HEPARIN SODIUM (PORCINE) 1000 UNIT/ML DIALYSIS
1000.0000 [IU] | INTRAMUSCULAR | Status: DC | PRN
  Administered 2017-07-01: 1000 [IU] via INTRAVENOUS_CENTRAL

## 2017-06-27 MED ORDER — ALTEPLASE 2 MG IJ SOLR: 2.0000 mg | Freq: Once | INTRAMUSCULAR | Status: DC | PRN

## 2017-06-27 MED ORDER — HEPARIN SODIUM (PORCINE) 1000 UNIT/ML DIALYSIS
100.0000 [IU]/kg | INTRAMUSCULAR | Status: DC | PRN
  Filled 2017-06-27: qty 9

## 2017-06-27 MED ORDER — PENTAFLUOROPROP-TETRAFLUOROETH EX AERO: 1.0000 "application " | INHALATION_SPRAY | CUTANEOUS | Status: DC | PRN

## 2017-06-27 MED ORDER — LIDOCAINE HCL (PF) 1 % IJ SOLN: 5.0000 mL | INTRAMUSCULAR | Status: DC | PRN

## 2017-06-27 MED ORDER — SODIUM CHLORIDE 0.9 % IV SOLN: 100.0000 mL | INTRAVENOUS | Status: DC | PRN

## 2017-06-27 MED ORDER — LIDOCAINE-PRILOCAINE 2.5-2.5 % EX CREA: 1.0000 "application " | TOPICAL_CREAM | CUTANEOUS | Status: DC | PRN

## 2017-06-27 MED ORDER — INSULIN GLARGINE 100 UNIT/ML ~~LOC~~ SOLN
17.0000 [IU] | Freq: Every day | SUBCUTANEOUS | Status: DC
  Administered 2017-06-27 – 2017-07-06 (×10): 17 [IU] via SUBCUTANEOUS
  Filled 2017-06-27 (×11): qty 0.17

## 2017-06-27 MED ORDER — CEFUROXIME SODIUM 1.5 G IV SOLR
1.5000 g | INTRAVENOUS | Status: AC
  Filled 2017-06-27 (×2): qty 1.5

## 2017-06-27 NOTE — Progress Notes (Signed)
LVAD Coordinator Rounding Note:  Admitted 06/13/17 due to RV failure, marked volume overload.   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 Darcey Nora under Destination Therapy criteria.   Vital signs: HR: 76 AFib Doppler Pressure: 86 Automatic BP: 101/86 O2 Sat: 99% on RA Wt in lbs: [phone]  LVAD interrogation reveals:   Speed:9600 Flow: 6.0 Power:  6.6w PI: 4.3 Alarms: none Events: none Fixed speed: 9600 Low speed limit: 9000  Drive Line: Sorbaview dressing dry and intact; needs weekly dressing changes. Next due 8/27.   Labs:  LDH trend: 317>270>295>255>271>293>304>297>260>272>251>235  INR trend: 2.15>2.3>2.04>1.94>1.79>1.66>1.95>1.72>1.43>1.27>1.23>1.21  Creat trend:  4.79>5.80>5.73>5.79>6.08>6.56>6.73>6.74>4.84>3.43>2.96>2.50>2.37>5.39>3.78>4.70>3.76>5.03  Hgb trend: 7.4>6.5>8.6>7.9>7.9>8.4>8.5>8.7>8.6>8.5>8.5>8.4>8.2  WBC trend: 10.7>13.3>11.0>11.3>17.5>18.4>18.5>14.2>14.8>12.5>13.6>14.4  Blood Transfusion: 06/14/17 - 2 units PRBCs 06/22/17- 1 unit PRBC  Renal: -temporary dialysis catheter placed 06/17/17 -CVVH initiated 06/17/17 stopped 8/18 -HD started 06/23/17, will have again today   Gtts: Heparin   Anticoagulation Plan: -INR Goal:  2.0 - 2.5 - warfarin on hold -ASA Dose: 81 mg daily - on hold  Device: -Medtronic single lead -Therapies: on   Plan/Recommendations:   1. Plan to arrange outpatient HD 2. Dialysis today. 3. Pt posted for 0715 AV fistula in the OR Monday morning -  VAD Coordinator will accompany pt to the OR. 4. Please page VAD coordinator for equipment issues or patient concerns.  [name], VAD Coordinator 24/7 pager [phone]

## 2017-06-27 NOTE — Progress Notes (Signed)
ANTICOAGULATION CONSULT NOTE  Pharmacy Consult for Heparin Indication: LVAD  No Known Allergies  Patient Measurements: Height: 5\' 5"  (165.1 cm) Weight: 183 lb 10.3 oz (83.3 kg) IBW/kg (Calculated) : 61.5 Heparin Dosing Weight = 80.7 kg  Vital Signs: Temp: 98.4 F (36.9 C) (08/24 0758) Temp Source: Oral (08/24 0758) BP: 106/94 (08/24 0558)  Labs:  Recent Labs  06/25/17 0426 06/25/17 1800 06/26/17 0412 06/27/17 0458  HGB 8.5*  --  8.4* 8.2*  HCT 26.2*  --  26.5* 25.9*  PLT 78*  --  87* 104*  LABPROT 15.6*  --  15.6* 15.4*  INR 1.23  --  1.23 1.21  HEPARINUNFRC 0.23* 0.53 0.36 0.33  CREATININE 4.70*  --  3.76* 5.03*    Estimated Creatinine Clearance: 13.4 mL/min (A) (by C-G formula based on SCr of 5.03 mg/dL (H)).   Medications: . sodium chloride    . sodium chloride    . sodium chloride    . sodium chloride    . heparin 1,350 Units/hr (06/27/17 0758)    Assessment: 71 y.o.malewith a history of CAD s/p CABG x 4 2010, OSA, AS with TAVR 2015, CKD, DM2, paroxysmal atrial fibrillation, asthma, and ischemic cardiomyopathy with Medtronic ICD.   Heparin level is 0.33- at goal after decrease to 1350 units/hr.  H/H low-stable. Platelets are up some at 104, chronic thrombocytopenia.    Goal of Therapy:  Heparin Level 0.3-0.5 Monitor platelets by anticoagulation protocol: Yes   Plan:  Continue heparin infusion at 1350 units/hr Daily heparin level and CBC.  Monitor for bleeding.  Resume Coumadin soon once HD access placed.  Uvaldo Rising, BCPS  Clinical Pharmacist Pager [phone]  06/27/2017 9:06 AM

## 2017-06-27 NOTE — Care Management Note (Signed)
Case Management Note Previous Note Completed by Irven Shelling RN, BSN Unit 4E-Case Manager-- [place] coverage [phone]  Patient Details  Name: Trevor [name] MRN: [id number] Date of Birth: 1946-10-28  Subjective/Objective:  Pt admitted with Acute on chronic systolic CHF -- placed on IV diuresis  - hx of LVAD placement February 2018              Action/Plan: PTA pt lived at home- followed by HF clinic- CM to follow   Expected Discharge Date:                  Expected Discharge Plan:  [place]  In-House Referral:     Discharge planning Services  CM Consult  Post Acute Care Choice:    Choice offered to:     DME Arranged:    DME Agency:     HH Arranged:    [place] Agency:     Status of Service:  In process, will continue to follow  If discussed at Long Length of Stay Meetings, dates discussed:    Discharge Disposition:   Additional Comments: 06/27/2017 Pt transferred to [place] 8/23.  Pt is ESRD and will likely need long term HD.  Plan is for vein mapping  [name], RN 06/27/2017, 9:50 AM

## 2017-06-27 NOTE — Progress Notes (Signed)
Patient ID: Trevor [name], male   DOB: Mar 11, 1946, 71 y.o.   MRN: [id number]    Advanced Heart Failure VAD Team Note  Subjective:    Started on CVVHD 06/17/17 via temporary cath.   CVVHD stopped 8/17.   Tolerated iHD 8/20, 8/22  No complaints. Denies SOB.   Studies: RHC (06/13/17):  RA = 17 RV = 58/18 PA = 56/25 (37) PCW = 20 (v = 35) Fick cardiac output/index = 6.4/3.2 Thermo CO/CI = 4.1/2.1 PVR = 2.6 Ao sat = 98% PA sat = 56%, 60% Increased LVAD speed to 9600   PCW down to 19  LVAD INTERROGATION:  HeartMate II LVAD:  Flow 5.3  liters/min, speed 9600, power 6, PI 5.5  Rare PI events.     Objective:    Vital Signs:   Temp:  [98.1 F (36.7 C)-98.6 F (37 C)] 98.4 F (36.9 C) (08/24 0758) Resp:  [12-26] 12 (08/24 0300) BP: (101-127)/(86-106) 106/94 (08/24 0558) SpO2:  [95 %-99 %] 99 % (08/24 0850) Weight:  [183 lb 10.3 oz (83.3 kg)] 183 lb 10.3 oz (83.3 kg) (08/24 0400) Last BM Date: 06/26/17 Mean arterial Pressure 80s       Intake/Output:   Intake/Output Summary (Last 24 hours) at 06/27/17 1021 Last data filed at 06/27/17 0758  Gross per 24 hour  Intake            479.5 ml  Output             2400 ml  Net          -1920.5 ml     Physical Exam      Physical Exam: Physical Exam: GENERAL: Well appearing. In bed. Marland Kitchen HEENT: normal  NECK: Supple, JVP to jaw.   2+ bilaterally, no bruits.  No lymphadenopathy or thyromegaly appreciated.  L HD cath  CARDIAC:  Mechanical heart sounds with LVAD hum present.  LUNGS:  Clear to auscultation bilaterally.  ABDOMEN:  Soft, round, nontender, positive bowel sounds x4.     LVAD exit site: well-healed and incorporated.  Dressing dry and intact.  No erythema or drainage.  Stabilization device present and accurately applied.  Driveline dressing is being changed daily per sterile technique. EXTREMITIES:  Warm and dry, no cyanosis, clubbing, rash. R and LLE trace edema.  NEUROLOGIC:  Alert and oriented x 4.  Gait steady.  No  aphasia.  No dysarthria.  Affect pleasant.          Telemetry    Afib 70s personally reviewed.   Labs   Basic Metabolic Panel:  Recent Labs Lab 06/21/17 0430 06/22/17 0437 06/23/17 0545 06/24/17 0500 06/25/17 0426 06/26/17 0412 06/27/17 0458  NA 135 134* 133* 133* 131* 130* 132*  K 3.4* 4.0 3.9 3.7 3.6 3.5 3.8  CL 101 101 98* 97* 96* 94* 94*  CO2 26 23 25 27 26 26 26   GLUCOSE 93 142* 51* 191* 62* 210* 167*  BUN 38* 50* 60* 34* 44* 27* 38*  CREATININE 3.36* 4.47* 5.39* 3.78* 4.70* 3.76* 5.03*  CALCIUM 8.5* 8.5* 8.7* 8.4* 8.7* 8.5* 8.9  MG 2.2 2.1 1.8  --   --   --   --   PHOS 2.9 4.2 5.1* 3.2 4.2 2.9 4.1    Liver Function Tests:  Recent Labs Lab 06/23/17 0545 06/24/17 0500 06/25/17 0426 06/26/17 0412 06/27/17 0458  ALBUMIN 3.5 3.4* 3.5 3.4* 3.3*   No results for input(s): LIPASE, AMYLASE in the last 168 hours. No results for  input(s): AMMONIA in the last 168 hours.  CBC:  Recent Labs Lab 06/23/17 0545 06/24/17 0500 06/25/17 0426 06/26/17 0412 06/27/17 0458  WBC 18.5* 14.2* 14.8* 13.6* 14.4*  HGB 8.6* 8.5* 8.5* 8.4* 8.2*  HCT 26.6* 26.1* 26.2* 26.5* 25.9*  MCV 92.7 93.9 93.6 95.7 95.6  PLT 84* 85* 78* 87* 104*    INR:  Recent Labs Lab 06/23/17 0545 06/24/17 0500 06/25/17 0426 06/26/17 0412 06/27/17 0458  INR 1.22 1.27 1.23 1.23 1.21    Other results:  EKG:    Imaging   No results found.   Medications:     Scheduled Medications: . atorvastatin  40 mg Oral Daily  . budesonide  0.25 mg Nebulization BID  . busPIRone  5 mg Oral BID  . calcitRIOL  0.5 mcg Oral QODAY  . Chlorhexidine Gluconate Cloth  6 each Topical Daily  . darbepoetin (ARANESP) injection - NON-DIALYSIS  150 mcg Subcutaneous Q Fri-1800  . docusate sodium  100 mg Oral BID  . furosemide  160 mg Oral BID  . gabapentin  300 mg Oral QHS  . hydrALAZINE  10 mg Oral Q8H  . insulin aspart  0-15 Units Subcutaneous TID WC  . insulin aspart  0-5 Units Subcutaneous QHS    . insulin glargine  15 Units Subcutaneous QHS  . levothyroxine  25 mcg Oral QAC breakfast  . multivitamin  1 tablet Oral QHS  . neomycin-bacitracin-polymyxin   Topical Daily  . pantoprazole  40 mg Oral Q1200  . rOPINIRole  0.25 mg Oral QHS  . sevelamer carbonate  2.4 g Oral TID WC  . sildenafil  40 mg Oral TID  . sodium chloride flush  10-40 mL Intracatheter Q12H    Infusions: . sodium chloride    . sodium chloride    . sodium chloride    . sodium chloride    . [START ON 06/30/2017] cefUROXime (ZINACEF)  IV    . heparin 1,350 Units/hr (06/27/17 0758)    PRN Medications: sodium chloride, sodium chloride, sodium chloride, sodium chloride, acetaminophen, albuterol, alteplase, alteplase, heparin, heparin, heparin, heparin, lidocaine (PF), lidocaine-prilocaine, ondansetron (ZOFRAN) IV, oxyCODONE, pentafluoroprop-tetrafluoroeth, pentafluoroprop-tetrafluoroeth, sodium chloride flush, traMADol, traZODone   Patient Profile   Trevor Higginbotham Murphyis a 71 y.o.malewith a history of CAD s/p CABG x 4 2010, OSA, AS with TAVR 2015, CKD, DM2, paroxysmal atrial fibrillation, asthma, and ischemic cardiomyopathy with Medtronic ICD.   He admitted with marked volume overload and AKI despite with increase in outpatient diuretics.    Assessment/Plan:    1. Acute on chronic systolic CHF: Ischemic cardiomyopathy, EF 20-25% with RV dysfunction on 2/18 echo pre-LVAD. 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 Medtronic ICD. Overall, he has felt much better post-LVAD. However, he developed AKI and was eventually admitted in 5/18 after RHC showed elevated filling pressures. He was diuresed and creatinine came down. Since then, we have adjusted up and down his torsemide dose, with higher dose volume improves but creatinine worsens. With lower dose we can make creatinine look better but he gets volume overloaded.  He has again developed worsening volume overload with AKI, creatinine up to 5.79.  He had RHC on 8/10 as  above and milrinone 0.125 started, increased to 0.25 on 8/11.  Speed increased to 9600 on 8/10.   - Main issue is severe RV failure. CVVHD stopped 8/17.  Tolerated iHD. VVS consulted for fistula versus graft.  - No ASA with h/o GI bleeding => hold warfarin for now in case procedure needed, cover with  heparin gtt while INR subtherapeutic.  2. CAD: s/p CABG -No CP. Continue atorvastatin. No change.  3. AKI on CKD stage IV:  - CVVHD stopped 8/17. IV lasix started 8/18.  - Tolerating iHD. Plan iHD    - VVS consulted for fistula versus graft, vein mapping. Plan fistula next week . Marland Kitchen   4. Atrial fibrillation: Chronic. Warfarin held for now for access procedures, on heparin gtt.  - Rate controlled. Continue heparin.      - INR 1.2 .    Will need coumadin again once all access issues are cleared up.  5. RV failure: Continue Revatio 40 mg TID.  Now off milrinone.  iHD for volume control.  6. HTN:   Stable. Off amlodipine. Remains on hydralazine 10 mg every 8 hours.    7. Anemia: Baseline anemia of renal disease/chronic disease but recent bleeding from colonic AVMs. No overt bleeding, may be primarily anemia of renal disease at this point. FOBT negative.  Hgb 8.2 will need blood if continues to drip.  8. 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 - No overt bleeding. Suspect related to anemia of renal disease. FOBT negative this admission.  - Continue octreotide injections. 9. Skin cancer: On neck and RLE. Needs excision, does not have to stop warfarin. Will address further as outpatient. No change.  WOC assessed biopsy sites  10. Thrombocytopenia: Chronic, mild, stable.  11. Diabetes-  Increase lantus to 17 units. Follow CBGs closely.     I reviewed the LVAD parameters from today, and compared the results to the patient's prior recorded data.  No programming changes were made.  The LVAD is functioning within specified parameters.  The patient performs LVAD self-test  daily.  LVAD interrogation was negative for any significant power changes, alarms or PI events/speed drops.  LVAD equipment check completed and is in good working order.  Back-up equipment present.   LVAD education done on emergency procedures and precautions and reviewed exit site care.  Length of Stay: [place], NP 06/27/2017, 10:21 AM  VAD Team --- VAD ISSUES ONLY--- Pager [phone] (7am - 7am)  Advanced Heart Failure Team  Pager [phone] (M-F; 7a - 4p)  Please contact [facility] for night-coverage after hours (4p -7a ) and weekends on [email or web address]   Patient seen with NP, agree with the above note.  Stable today, had HD yesterday.  Will have AV fistula on Monday.  Off warfarin and on heparin gtt until then.  Hemoglobin stable.   Loralie Champagne 06/27/2017 2:45 PM

## 2017-06-27 NOTE — Progress Notes (Signed)
The patient requested that I do his LVAD check with the morning labs so as not to wake him during the night. I checked the LVAD at 0400. The previous check was around 2200 last night. Will continue to monitor.

## 2017-06-27 NOTE — Progress Notes (Signed)
Central tele called me to report a 17 beat run of v-tach at 2050. Reported to me at 2200. I notified Dr. Mliss Sax via Shea Evans. Will continue to monitor.

## 2017-06-27 NOTE — Procedures (Signed)
I was present at this session.  I have reviewed the session itself and made appropriate changes.  HD via temp cath. Flow 400.  bp stable, tol well.  Trevor [name] L 8/24/20184:44 PM

## 2017-06-27 NOTE — Progress Notes (Signed)
Subjective: Interval History: has no complaint .  Objective: Vital signs in last 24 hours: Temp:  [98.1 F (36.7 C)-98.6 F (37 C)] 98.1 F (36.7 C) (08/24 0400) Resp:  [12-26] 12 (08/24 0300) BP: (101-127)/(86-106) 106/94 (08/24 0558) SpO2:  [95 %-99 %] 99 % (08/23 1954) Weight:  [83.3 kg (183 lb 10.3 oz)] 83.3 kg (183 lb 10.3 oz) (08/24 0400) Weight change: -0.3 kg (-10.6 oz)  Intake/Output from previous day: 08/23 0701 - 08/24 0700 In: 520 [P.O.:240; I.V.:280] Out: 2275 [Urine:2275] Intake/Output this shift: No intake/output data recorded.  General appearance: alert, cooperative, no distress and mildly obese Neck: L IJ cath Resp: L Ij cath Cardio: cont hum GI: soft, nontender, LVAD tubes RUQ Extremities: edema 1+ and bandage R calf  Lab Results:  Recent Labs  06/26/17 0412 06/27/17 0458  WBC 13.6* 14.4*  HGB 8.4* 8.2*  HCT 26.5* 25.9*  PLT 87* 104*   BMET:  Recent Labs  06/26/17 0412 06/27/17 0458  NA 130* 132*  K 3.5 3.8  CL 94* 94*  CO2 26 26  GLUCOSE 210* 167*  BUN 27* 38*  CREATININE 3.76* 5.03*  CALCIUM 8.5* 8.9    Recent Labs  06/24/17 1100  PTH 176*   Iron Studies: No results for input(s): IRON, TIBC, TRANSFERRIN, FERRITIN in the last 72 hours.  Studies/Results: No results found.  I have reviewed the patient's current medications.  Assessment/Plan: 1 CKD5/ESRD needs HD, nonoliguric. For acess 2 Anemia ESA 3 CM with LVAD 4 DM controlled 5 HPTH 6 Skin Ca P HD, access, anticoag, ed    LOS: 14 days   Kento Gossman L 06/27/2017,8:05 AM

## 2017-06-27 NOTE — Consult Note (Signed)
[facility]  Subjective  - feels ok   Objective (!) 106/94 75 98.4 F (36.9 C) (Oral) 12 99%  Intake/Output Summary (Last 24 hours) at 06/27/17 0943 Last data filed at 06/27/17 0758  Gross per 24 hour  Intake              553 ml  Output             2400 ml  Net            -1847 ml   Right sider pacer No pulse left radial brachial  Assessment/Planning: Vein map reviewed.  Should have adequate vein for left brachial cephalic and most likely radial cephalic although not all of the vein was visualized.  He will also need tunneled dialysis catheter.  Currently has temp cath on left side.  He has a pacer on the right side.  Preop order written for Monday  Discussed with Dr Deterding holding off dialysis on Monday until after operation   Ruta Hinds 06/27/2017 9:43 AM --  Laboratory Lab Results:  Recent Labs  06/26/17 0412 06/27/17 0458  WBC 13.6* 14.4*  HGB 8.4* 8.2*  HCT 26.5* 25.9*  PLT 87* 104*   BMET  Recent Labs  06/26/17 0412 06/27/17 0458  NA 130* 132*  K 3.5 3.8  CL 94* 94*  CO2 26 26  GLUCOSE 210* 167*  BUN 27* 38*  CREATININE 3.76* 5.03*  CALCIUM 8.5* 8.9    COAG Lab Results  Component Value Date   INR 1.21 06/27/2017   INR 1.23 06/26/2017   INR 1.23 06/25/2017   No results found for: PTT

## 2017-06-28 LAB — RENAL FUNCTION PANEL
Albumin: 3.3 g/dL — ABNORMAL LOW (ref 3.5–5.0)
Anion gap: 8 (ref 5–15)
BUN: 18 mg/dL (ref 6–20)
CHLORIDE: 95 mmol/L — AB (ref 101–111)
CO2: 28 mmol/L (ref 22–32)
CREATININE: 3.15 mg/dL — AB (ref 0.61–1.24)
Calcium: 8.2 mg/dL — ABNORMAL LOW (ref 8.9–10.3)
GFR calc non Af Amer: 18 mL/min — ABNORMAL LOW (ref >60)
GFR, EST AFRICAN AMERICAN: 21 mL/min — AB (ref >60)
Glucose, Bld: 217 mg/dL — ABNORMAL HIGH (ref 65–99)
POTASSIUM: 3.6 mmol/L (ref 3.5–5.1)
Phosphorus: 2.9 mg/dL (ref 2.5–4.6)
Sodium: 131 mmol/L — ABNORMAL LOW (ref 135–145)

## 2017-06-28 LAB — CBC
HEMATOCRIT: 23.9 % — AB (ref 39.0–52.0)
HEMOGLOBIN: 7.8 g/dL — AB (ref 13.0–17.0)
MCH: 31.1 pg (ref 26.0–34.0)
MCHC: 32.6 g/dL (ref 30.0–36.0)
MCV: 95.2 fL (ref 78.0–100.0)
Platelets: 108 10*3/uL — ABNORMAL LOW (ref 150–400)
RBC: 2.51 MIL/uL — AB (ref 4.22–5.81)
RDW: 18.1 % — AB (ref 11.5–15.5)
WBC: 11.6 10*3/uL — AB (ref 4.0–10.5)

## 2017-06-28 LAB — PROTIME-INR
INR: 1.23
Prothrombin Time: 15.6 seconds — ABNORMAL HIGH (ref 11.4–15.2)

## 2017-06-28 LAB — GLUCOSE, CAPILLARY
GLUCOSE-CAPILLARY: 186 mg/dL — AB (ref 65–99)
GLUCOSE-CAPILLARY: 205 mg/dL — AB (ref 65–99)
Glucose-Capillary: 143 mg/dL — ABNORMAL HIGH (ref 65–99)
Glucose-Capillary: 201 mg/dL — ABNORMAL HIGH (ref 65–99)

## 2017-06-28 LAB — HEPARIN LEVEL (UNFRACTIONATED): Heparin Unfractionated: 0.38 IU/mL (ref 0.30–0.70)

## 2017-06-28 LAB — LACTATE DEHYDROGENASE: LDH: 254 U/L — ABNORMAL HIGH (ref 98–192)

## 2017-06-28 NOTE — Progress Notes (Signed)
Subjective: Interval History: has no complaint .  Objective: Vital signs in last 24 hours: Temp:  [98.1 F (36.7 C)-98.7 F (37.1 C)] 98.3 F (36.8 C) (08/25 0328) Pulse Rate:  [62-79] 64 (08/25 0328) Resp:  [13-24] 13 (08/25 0328) BP: (90-122)/(59-104) 90/59 (08/25 0400) SpO2:  [98 %-99 %] 99 % (08/25 0328) Weight:  [83.3 kg (183 lb 10.3 oz)-84.6 kg (186 lb 8.2 oz)] 83.3 kg (183 lb 10.3 oz) (08/25 0550) Weight change: 1.3 kg (2 lb 13.9 oz)  Intake/Output from previous day: 08/24 0701 - 08/25 0700 In: 1524 [P.O.:1200; I.V.:324] Out: 1375 [Urine:1375] Intake/Output this shift: No intake/output data recorded.  General appearance: alert, cooperative and no distress Neck: Ij cath Resp: clear to auscultation bilaterally Cardio: cont hum GI: liver down 6 cm.  LVAD tubes RUQ Extremities: dressing R calf  Lab Results:  Recent Labs  06/26/17 0412 06/27/17 0458  WBC 13.6* 14.4*  HGB 8.4* 8.2*  HCT 26.5* 25.9*  PLT 87* 104*   BMET:  Recent Labs  06/27/17 0458 06/28/17 0416  NA 132* 131*  K 3.8 3.6  CL 94* 95*  CO2 26 28  GLUCOSE 167* 217*  BUN 38* 18  CREATININE 5.03* 3.15*  CALCIUM 8.9 8.2*   No results for input(s): PTH in the last 72 hours. Iron Studies: No results for input(s): IRON, TIBC, TRANSFERRIN, FERRITIN in the last 72 hours.  Studies/Results: No results found.  I have reviewed the patient's current medications.  Assessment/Plan: 1 ESRD for access on Mon.  HD yest 2 DM controlled 3 Anemia esa 4 HPTH 5 CM with LVAD making plans 6 Anticoag hep 7 skin Ca P Access, Hd, Plans for  outpatient    LOS: 15 days   Rosana Farnell L 06/28/2017,7:47 AM

## 2017-06-28 NOTE — Progress Notes (Signed)
CARDIAC REHAB PHASE I   Pt is ambulating independently. Pt was mid way through walk upon arrival. [place] with pt for support and social interaction. Encouraged another walking bout later today. Pt was happy to see cardiac rehab. Will f/u Monday.  Malachy Coleman D Ladelle Teodoro,MS,ACSM-RCEP 06/28/2017 12:56 PM

## 2017-06-28 NOTE — Progress Notes (Deleted)
Pt taken off bipap by RN and placed on supplemental O2.  No SOB at this time.  RT will continue to monitor.

## 2017-06-28 NOTE — Progress Notes (Signed)
ANTICOAGULATION CONSULT NOTE  Pharmacy Consult for Heparin Indication: LVAD  No Known Allergies  Patient Measurements: Height: 5\' 5"  (165.1 cm) Weight: 183 lb 10.3 oz (83.3 kg) IBW/kg (Calculated) : 61.5 Heparin Dosing Weight = 80.7 kg  Vital Signs: Temp: 98.1 F (36.7 C) (08/25 0746) Temp Source: Oral (08/25 0746) BP: 106/91 (08/25 0746) Pulse Rate: 65 (08/25 0746)  Labs:  Recent Labs  06/26/17 0412 06/27/17 0458 06/28/17 0416  HGB 8.4* 8.2*  --   HCT 26.5* 25.9*  --   PLT 87* 104*  --   LABPROT 15.6* 15.4*  --   INR 1.23 1.21  --   HEPARINUNFRC 0.36 0.33 0.38  CREATININE 3.76* 5.03* 3.15*    Estimated Creatinine Clearance: 21.4 mL/min (A) (by C-G formula based on SCr of 3.15 mg/dL (H)).   Medications: . sodium chloride    . sodium chloride    . [START ON 06/30/2017] cefUROXime (ZINACEF)  IV    . heparin 1,350 Units/hr (06/28/17 0800)    Assessment: 70 y.o.malewith a history of CAD s/p CABG x 4 2010, OSA, AS with TAVR 2015, CKD, DM2, paroxysmal atrial fibrillation, asthma, and ischemic cardiomyopathy with Medtronic ICD.   Heparin level is 0.38- at goal heparin drip rate 1350 units/hr.  H/H low-stable. Platelets are up some at 104, chronic thrombocytopenia.  LDH stable 200s  Goal of Therapy:  Heparin Level 0.3-0.5 Monitor platelets by anticoagulation protocol: Yes   Plan:  Continue heparin infusion at 1350 units/hr Daily heparin level and CBC.  Monitor for bleeding.  Resume Coumadin soon once HD access placed.  [name] Pharm.D. CPP, BCPS Clinical Pharmacist [phone] 06/28/2017 10:51 AM

## 2017-06-28 NOTE — Progress Notes (Signed)
Patient ID: Trevor [name], male   DOB: [birth year], 71 y.o.   MRN: [id number]    Advanced Heart Failure VAD Team Note  Subjective:    Started on CVVHD 06/17/17 via temporary cath.   CVVHD stopped 8/17.   Tolerated iHD 8/20, 8/22, 8/24  Feels fine. No SOB. Weight 183.   Studies: RHC (06/13/17):  RA = 17 RV = 58/18 PA = 56/25 (37) PCW = 20 (v = 35) Fick cardiac output/index = 6.4/3.2 Thermo CO/CI = 4.1/2.1 PVR = 2.6 Ao sat = 98% PA sat = 56%, 60% Increased LVAD speed to 9600   PCW down to 19  LVAD INTERROGATION:  HeartMate II LVAD:  Flow 5.2  liters/min, speed 9600, power 4.0, PI 6.0  Rare PI events.     Objective:    Vital Signs:   Temp:  [98.1 F (36.7 C)-98.7 F (37.1 C)] 98.1 F (36.7 C) (08/25 0746) Pulse Rate:  [62-79] 65 (08/25 0746) Resp:  [13-24] 15 (08/25 0746) BP: (90-122)/(59-104) 106/91 (08/25 0746) SpO2:  [98 %-99 %] 99 % (08/25 0746) Weight:  [83.3 kg (183 lb 10.3 oz)-84.6 kg (186 lb 8.2 oz)] 83.3 kg (183 lb 10.3 oz) (08/25 0550) Last BM Date: 06/27/17 Mean arterial Pressure 80-90s       Intake/Output:   Intake/Output Summary (Last 24 hours) at 06/28/17 1038 Last data filed at 06/28/17 0956  Gross per 24 hour  Intake          1591.51 ml  Output             1500 ml  Net            91.51 ml     Physical Exam    General:  NAD.  HEENT: normal  Neck: supple. JVP 8.  Carotids 2+ bilat; no bruits. No lymphadenopathy or thryomegaly appreciated. Cor: LVAD hum.  Lungs: Clear. Abdomensoft, nontender, non-distended. No hepatosplenomegaly. No bruits or masses. Good bowel sounds. Driveline site clean. Anchor in place.  Extremities: no cyanosis, clubbing, rash. Warm no  Trace edema  Neuro: alert & oriented x 3. No focal deficits. Moves all 4 without problem    Telemetry    Afib 70-80s personally reviewed.   Labs   Basic Metabolic Panel:  Recent Labs Lab 06/22/17 0437 06/23/17 0545 06/24/17 0500 06/25/17 0426 06/26/17 0412 06/27/17 0458  06/28/17 0416  NA 134* 133* 133* 131* 130* 132* 131*  K 4.0 3.9 3.7 3.6 3.5 3.8 3.6  CL 101 98* 97* 96* 94* 94* 95*  CO2 23 25 27 26 26 26 28   GLUCOSE 142* 51* 191* 62* 210* 167* 217*  BUN 50* 60* 34* 44* 27* 38* 18  CREATININE 4.47* 5.39* 3.78* 4.70* 3.76* 5.03* 3.15*  CALCIUM 8.5* 8.7* 8.4* 8.7* 8.5* 8.9 8.2*  MG 2.1 1.8  --   --   --   --   --   PHOS 4.2 5.1* 3.2 4.2 2.9 4.1 2.9    Liver Function Tests:  Recent Labs Lab 06/24/17 0500 06/25/17 0426 06/26/17 0412 06/27/17 0458 06/28/17 0416  ALBUMIN 3.4* 3.5 3.4* 3.3* 3.3*   No results for input(s): LIPASE, AMYLASE in the last 168 hours. No results for input(s): AMMONIA in the last 168 hours.  CBC:  Recent Labs Lab 06/23/17 0545 06/24/17 0500 06/25/17 0426 06/26/17 0412 06/27/17 0458  WBC 18.5* 14.2* 14.8* 13.6* 14.4*  HGB 8.6* 8.5* 8.5* 8.4* 8.2*  HCT 26.6* 26.1* 26.2* 26.5* 25.9*  MCV 92.7 93.9 93.6 95.7 95.6  PLT 84* 85* 78* 87* 104*    INR:  Recent Labs Lab 06/23/17 0545 06/24/17 0500 06/25/17 0426 06/26/17 0412 06/27/17 0458  INR 1.22 1.27 1.23 1.23 1.21    Other results:     Imaging   No results found.   Medications:     Scheduled Medications: . atorvastatin  40 mg Oral Daily  . budesonide  0.25 mg Nebulization BID  . busPIRone  5 mg Oral BID  . calcitRIOL  0.5 mcg Oral QODAY  . Chlorhexidine Gluconate Cloth  6 each Topical Daily  . darbepoetin (ARANESP) injection - NON-DIALYSIS  150 mcg Subcutaneous Q Fri-1800  . docusate sodium  100 mg Oral BID  . furosemide  160 mg Oral BID  . gabapentin  300 mg Oral QHS  . hydrALAZINE  10 mg Oral Q8H  . insulin aspart  0-15 Units Subcutaneous TID WC  . insulin aspart  0-5 Units Subcutaneous QHS  . insulin glargine  17 Units Subcutaneous QHS  . levothyroxine  25 mcg Oral QAC breakfast  . multivitamin  1 tablet Oral QHS  . neomycin-bacitracin-polymyxin   Topical Daily  . pantoprazole  40 mg Oral Q1200  . rOPINIRole  0.25 mg Oral QHS  .  sevelamer carbonate  2.4 g Oral TID WC  . sildenafil  40 mg Oral TID  . sodium chloride flush  10-40 mL Intracatheter Q12H    Infusions: . sodium chloride    . sodium chloride    . [START ON 06/30/2017] cefUROXime (ZINACEF)  IV    . heparin 1,350 Units/hr (06/28/17 0800)    PRN Medications: sodium chloride, sodium chloride, acetaminophen, albuterol, alteplase, heparin, heparin, lidocaine (PF), lidocaine-prilocaine, ondansetron (ZOFRAN) IV, oxyCODONE, pentafluoroprop-tetrafluoroeth, sodium chloride flush, traMADol, traZODone   Patient Profile   Trevor Pollard Murphyis a 71 y.o.malewith a history of CAD s/p CABG x 4 2010, OSA, AS with TAVR 2015, CKD, DM2, paroxysmal atrial fibrillation, asthma, and ischemic cardiomyopathy with Medtronic ICD.   He admitted with marked volume overload and AKI despite with increase in outpatient diuretics.    Assessment/Plan:    1. Acute on chronic systolic CHF: Ischemic cardiomyopathy, EF 20-25% with RV dysfunction on 2/18 echo pre-LVAD. 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 Medtronic ICD. Overall, he has felt much better post-LVAD. However, he developed AKI and was eventually admitted in 5/18 after RHC showed elevated filling pressures. He was diuresed and creatinine came down. Since then, we have adjusted up and down his torsemide dose, with higher dose volume improves but creatinine worsens. With lower dose we can make creatinine look better but he gets volume overloaded.  He has again developed worsening volume overload with AKI, creatinine up to 5.79.  He had RHC on 8/10 as above and milrinone 0.125 started, increased to 0.25 on 8/11.  Speed increased to 9600 on 8/10.   - Main issue is severe RV failure. CVVHD stopped 8/17.  Tolerated iHD.  - Volume status and VAD parameters stable.  - No ASA with h/o GI bleeding => holding warfarin for AVF on Monday. Continue heparin gtt while INR subtherapeutic. D/w PharmD 2. CAD: s/p CABG -No CP. Continue atorvastatin.  No change.  3. AKI on CKD stage IV:  - CVVHD stopped 8/17 - Tolerating iHD well - For tunneled cath and AVF fistula with VVS on Monday 4. Atrial fibrillation: Chronic. Warfarin held for now for access procedures, on heparin gtt.  - Rate controlled. Off coumadin for OR Monday. Continue heparin.      -  Will need coumadin again once all access issues are cleared up.  5. RV failure: Continue Revatio 40 mg TID.  Now off milrinone.  6. HTN:   Stable. Off amlodipine. Remains on hydralazine 10 mg every 8 hours.    7. Anemia: Baseline anemia of renal disease/chronic disease but recent bleeding from colonic AVMs. No overt bleeding, may be primarily anemia of renal disease at this point. FOBT negative.  - Hgb 8.2 yesterday will need blood if continues to drip.  8. 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 - No overt bleeding. Suspect related to anemia of renal disease. FOBT negative this admission.  - Continue octreotide injections. 9. Skin cancer: On neck and RLE. Needs excision, does not have to stop warfarin. Will address further as outpatient. No change.  WOC assessed biopsy sites  10. Thrombocytopenia: Chronic, mild, stable.  11. Diabetes-  Continue lantus. Follow CBGs closely.     I reviewed the LVAD parameters from today, and compared the results to the patient's prior recorded data.  No programming changes were made.  The LVAD is functioning within specified parameters.  The patient performs LVAD self-test daily.  LVAD interrogation was negative for any significant power changes, alarms or PI events/speed drops.  LVAD equipment check completed and is in good working order.  Back-up equipment present.   LVAD education done on emergency procedures and precautions and reviewed exit site care.  Length of Stay: 15  Glori Bickers, MD 06/28/2017, 10:38 AM  VAD Team --- VAD ISSUES ONLY--- Pager [phone] (7am - 7am)  Advanced Heart Failure Team  Pager [phone]  (M-F; 7a - 4p)  Please contact [facility] for night-coverage after hours (4p -7a ) and weekends on [email or web address]

## 2017-06-29 LAB — PROTIME-INR
INR: 1.19
PROTHROMBIN TIME: 15.2 s (ref 11.4–15.2)

## 2017-06-29 LAB — LACTATE DEHYDROGENASE: LDH: 247 U/L — ABNORMAL HIGH (ref 98–192)

## 2017-06-29 LAB — CBC
HEMATOCRIT: 27.2 % — AB (ref 39.0–52.0)
HEMOGLOBIN: 8.7 g/dL — AB (ref 13.0–17.0)
MCH: 31.1 pg (ref 26.0–34.0)
MCHC: 32 g/dL (ref 30.0–36.0)
MCV: 97.1 fL (ref 78.0–100.0)
Platelets: 101 10*3/uL — ABNORMAL LOW (ref 150–400)
RBC: 2.8 MIL/uL — AB (ref 4.22–5.81)
RDW: 18.6 % — ABNORMAL HIGH (ref 11.5–15.5)
WBC: 11.7 10*3/uL — AB (ref 4.0–10.5)

## 2017-06-29 LAB — RENAL FUNCTION PANEL
ANION GAP: 10 (ref 5–15)
Albumin: 3.5 g/dL (ref 3.5–5.0)
BUN: 33 mg/dL — AB (ref 6–20)
CHLORIDE: 96 mmol/L — AB (ref 101–111)
CO2: 28 mmol/L (ref 22–32)
Calcium: 8.9 mg/dL (ref 8.9–10.3)
Creatinine, Ser: 4.69 mg/dL — ABNORMAL HIGH (ref 0.61–1.24)
GFR calc Af Amer: 13 mL/min — ABNORMAL LOW (ref >60)
GFR, EST NON AFRICAN AMERICAN: 11 mL/min — AB (ref >60)
Glucose, Bld: 80 mg/dL (ref 65–99)
POTASSIUM: 3.8 mmol/L (ref 3.5–5.1)
Phosphorus: 4.1 mg/dL (ref 2.5–4.6)
Sodium: 134 mmol/L — ABNORMAL LOW (ref 135–145)

## 2017-06-29 LAB — GLUCOSE, CAPILLARY
GLUCOSE-CAPILLARY: 150 mg/dL — AB (ref 65–99)
GLUCOSE-CAPILLARY: 230 mg/dL — AB (ref 65–99)
Glucose-Capillary: 78 mg/dL (ref 65–99)
Glucose-Capillary: 214 mg/dL — ABNORMAL HIGH (ref 65–99)

## 2017-06-29 LAB — HEPARIN LEVEL (UNFRACTIONATED): HEPARIN UNFRACTIONATED: 0.2 [IU]/mL — AB (ref 0.30–0.70)

## 2017-06-29 MED ORDER — ROPINIROLE HCL 0.25 MG PO TABS
0.2500 mg | ORAL_TABLET | Freq: Every day | ORAL | Status: DC
  Administered 2017-06-29 – 2017-07-06 (×8): 0.25 mg via ORAL
  Filled 2017-06-29 (×8): qty 1

## 2017-06-29 NOTE — Progress Notes (Signed)
Pt ambulating in hall, c/odizziness and assisted pt back to room.  VSS done, bp97/74 (82) automatic, 105 manual cuff.  Pt placed on ACwall power.  Self check completed.  No alarms noted.  VAD settings as documented on VAD flowsheet.  Pt states he is no longer dizzy.  Will continue to monitor.

## 2017-06-29 NOTE — Progress Notes (Signed)
Subjective: Interval History: has no complaint.  Objective: Vital signs in last 24 hours: Temp:  [98.1 F (36.7 C)-98.7 F (37.1 C)] 98.4 F (36.9 C) (08/26 0311) Pulse Rate:  [64-71] 64 (08/26 0311) Resp:  [12-23] 14 (08/26 0500) BP: (81-119)/(69-94) 119/94 (08/26 0500) SpO2:  [98 %-100 %] 100 % (08/26 0500) Weight:  [82.6 kg (182 lb 1.6 oz)] 82.6 kg (182 lb 1.6 oz) (08/26 0543) Weight change: -2 kg (-4 lb 6.6 oz)  Intake/Output from previous day: 08/25 0701 - 08/26 0700 In: 831 [P.O.:480; I.V.:351] Out: 1750 [Urine:1750] Intake/Output this shift: Total I/O In: 19.4 [I.V.:19.4] Out: -   General appearance: alert, cooperative, no distress, mildly obese and pale Neck: IJ cath Resp: rales bibasilar Cardio: cont hum GI: LVAD tubes RUQ, liver down 6 cm Extremities: edema Tr, dressing R calf and ivs  Lab Results:  Recent Labs  06/28/17 1051 06/29/17 0500  WBC 11.6* 11.7*  HGB 7.8* 8.7*  HCT 23.9* 27.2*  PLT 108* 101*   BMET:  Recent Labs  06/28/17 0416 06/29/17 0500  NA 131* 134*  K 3.6 3.8  CL 95* 96*  CO2 28 28  GLUCOSE 217* 80  BUN 18 33*  CREATININE 3.15* 4.69*  CALCIUM 8.2* 8.9   No results for input(s): PTH in the last 72 hours. Iron Studies: No results for input(s): IRON, TIBC, TRANSFERRIN, FERRITIN in the last 72 hours.  Studies/Results: No results found.  I have reviewed the patient's current medications.  Assessment/Plan: 1 ESRD HD and access tomorrow.  2 CM stable, LVAD 3 Anemia 4 DM controlled 5 HPTH P HD, Access, CLIP ,ed outpatient starting, hep, esa,     LOS: 16 days   Shadana Pry L 06/29/2017,7:40 AM

## 2017-06-29 NOTE — Progress Notes (Signed)
ANTICOAGULATION CONSULT NOTE  Pharmacy Consult for Heparin Indication: LVAD  No Known Allergies  Patient Measurements: Height: 5\' 5"  (165.1 cm) Weight: 182 lb 1.6 oz (82.6 kg) IBW/kg (Calculated) : 61.5 Heparin Dosing Weight = 80.7 kg  Vital Signs: Temp: 98.8 F (37.1 C) (08/26 0745) Temp Source: Oral (08/26 0745) BP: 99/81 (08/26 0745) Pulse Rate: 86 (08/26 0745)  Labs:  Recent Labs  06/27/17 0458 06/28/17 0416 06/28/17 1051 06/29/17 0500  HGB 8.2*  --  7.8* 8.7*  HCT 25.9*  --  23.9* 27.2*  PLT 104*  --  108* 101*  LABPROT 15.4*  --  15.6* 15.2  INR 1.21  --  1.23 1.19  HEPARINUNFRC 0.33 0.38  --  0.20*  CREATININE 5.03* 3.15*  --  4.69*    Estimated Creatinine Clearance: 14.3 mL/min (A) (by C-G formula based on SCr of 4.69 mg/dL (H)).   Medications: . sodium chloride    . sodium chloride    . [START ON 06/30/2017] cefUROXime (ZINACEF)  IV    . heparin 1,450 Units/hr (06/29/17 1200)    Assessment: 71 y.o.malewith a history of CAD s/p CABG x 4 2010, OSA, AS with TAVR 2015, CKD, DM2, paroxysmal atrial fibrillation, asthma, and ischemic cardiomyopathy with Medtronic ICD.   Heparin level has been stable but  fell 0.2 this am - at goal heparin drip rate 1350 units/hr. Will increase H/H low-stable. Platelets are up some at 104, chronic thrombocytopenia.  LDH stable 200s  Goal of Therapy:  Heparin Level 0.3-0.5 Monitor platelets by anticoagulation protocol: Yes   Plan:  Increase heparin infusion at 1450 units/hr Daily heparin level and CBC.  Monitor for bleeding.  Resume Coumadin soon once HD access placed.  [name] Pharm.D. CPP, BCPS Clinical Pharmacist [phone] 06/29/2017 12:15 PM

## 2017-06-29 NOTE — Progress Notes (Signed)
Patient ID: [name], male   DOB: 1946/03/28, 71 y.o.   MRN: [id number]    Advanced Heart Failure VAD Team Note  Subjective:    Started on CVVHD 06/17/17 via temporary cath.   CVVHD stopped 8/17.   Tolerated iHD 8/20, 8/22, 8/24  Made ~1.8L urine. Weight down a pound. No SOB.   Studies: RHC (06/13/17):  RA = 17 RV = 58/18 PA = 56/25 (37) PCW = 20 (v = 35) Fick cardiac output/index = 6.4/3.2 Thermo CO/CI = 4.1/2.1 PVR = 2.6 Ao sat = 98% PA sat = 56%, 60% Increased LVAD speed to 9600   PCW down to 19  LVAD INTERROGATION:  HeartMate II LVAD:  Flow 4.5  liters/min, speed 9600, power 3.7, PI 3.8  Rare PI events.     Objective:    Vital Signs:   Temp:  [97.6 F (36.4 C)-98.8 F (37.1 C)] 97.6 F (36.4 C) (08/26 1244) Pulse Rate:  [64-86] 68 (08/26 1244) Resp:  [12-19] 19 (08/26 0745) BP: (81-119)/(69-94) 118/93 (08/26 1244) SpO2:  [98 %-100 %] 100 % (08/26 1244) Weight:  [82.6 kg (182 lb 1.6 oz)] 82.6 kg (182 lb 1.6 oz) (08/26 0543) Last BM Date: 06/28/17 Mean arterial Pressure 80s       Intake/Output:   Intake/Output Summary (Last 24 hours) at 06/29/17 1302 Last data filed at 06/29/17 1200  Gross per 24 hour  Intake           901.34 ml  Output             1600 ml  Net          -698.66 ml     Physical Exam    General:  NAD.  HEENT: normal  Neck: supple. JVP not elevated.  Carotids 2+ bilat; no bruits. No lymphadenopathy or thryomegaly appreciated. Cor: LVAD hum.  Lungs: Clear. Abdomen: obese soft, nontender, non-distended. No hepatosplenomegaly. No bruits or masses. Good bowel sounds. Driveline site clean. Anchor in place.  Extremities: no cyanosis, clubbing, rash. Trace edema  Neuro: alert & oriented x 3. No focal deficits. Moves all 4 without problem    Telemetry    Afib 70s personally reviewed.   Labs   Basic Metabolic Panel:  Recent Labs Lab 06/23/17 0545  06/25/17 0426 06/26/17 0412 06/27/17 0458 06/28/17 0416 06/29/17 0500  NA  133*  < > 131* 130* 132* 131* 134*  K 3.9  < > 3.6 3.5 3.8 3.6 3.8  CL 98*  < > 96* 94* 94* 95* 96*  CO2 25  < > 26 26 26 28 28   GLUCOSE 51*  < > 62* 210* 167* 217* 80  BUN 60*  < > 44* 27* 38* 18 33*  CREATININE 5.39*  < > 4.70* 3.76* 5.03* 3.15* 4.69*  CALCIUM 8.7*  < > 8.7* 8.5* 8.9 8.2* 8.9  MG 1.8  --   --   --   --   --   --   PHOS 5.1*  < > 4.2 2.9 4.1 2.9 4.1  < > = values in this interval not displayed.  Liver Function Tests:  Recent Labs Lab 06/25/17 0426 06/26/17 0412 06/27/17 0458 06/28/17 0416 06/29/17 0500  ALBUMIN 3.5 3.4* 3.3* 3.3* 3.5   No results for input(s): LIPASE, AMYLASE in the last 168 hours. No results for input(s): AMMONIA in the last 168 hours.  CBC:  Recent Labs Lab 06/25/17 0426 06/26/17 0412 06/27/17 0458 06/28/17 1051 06/29/17 0500  WBC 14.8* 13.6*  14.4* 11.6* 11.7*  HGB 8.5* 8.4* 8.2* 7.8* 8.7*  HCT 26.2* 26.5* 25.9* 23.9* 27.2*  MCV 93.6 95.7 95.6 95.2 97.1  PLT 78* 87* 104* 108* 101*    INR:  Recent Labs Lab 06/25/17 0426 06/26/17 0412 06/27/17 0458 06/28/17 1051 06/29/17 0500  INR 1.23 1.23 1.21 1.23 1.19    Other results:     Imaging   No results found.   Medications:     Scheduled Medications: . atorvastatin  40 mg Oral Daily  . budesonide  0.25 mg Nebulization BID  . busPIRone  5 mg Oral BID  . calcitRIOL  0.5 mcg Oral QODAY  . Chlorhexidine Gluconate Cloth  6 each Topical Daily  . darbepoetin (ARANESP) injection - NON-DIALYSIS  150 mcg Subcutaneous Q Fri-1800  . docusate sodium  100 mg Oral BID  . furosemide  160 mg Oral BID  . gabapentin  300 mg Oral QHS  . hydrALAZINE  10 mg Oral Q8H  . insulin aspart  0-15 Units Subcutaneous TID WC  . insulin aspart  0-5 Units Subcutaneous QHS  . insulin glargine  17 Units Subcutaneous QHS  . levothyroxine  25 mcg Oral QAC breakfast  . multivitamin  1 tablet Oral QHS  . neomycin-bacitracin-polymyxin   Topical Daily  . pantoprazole  40 mg Oral Q1200  .  rOPINIRole  0.25 mg Oral QHS  . sevelamer carbonate  2.4 g Oral TID WC  . sildenafil  40 mg Oral TID  . sodium chloride flush  10-40 mL Intracatheter Q12H    Infusions: . sodium chloride    . sodium chloride    . [START ON 06/30/2017] cefUROXime (ZINACEF)  IV    . heparin 1,450 Units/hr (06/29/17 1200)    PRN Medications: sodium chloride, sodium chloride, acetaminophen, albuterol, alteplase, heparin, heparin, lidocaine (PF), lidocaine-prilocaine, ondansetron (ZOFRAN) IV, oxyCODONE, pentafluoroprop-tetrafluoroeth, sodium chloride flush, traMADol, traZODone   Patient Profile   Jery Hollern Murphyis a 71 y.o.malewith a history of CAD s/p CABG x 4 2010, OSA, AS with TAVR 2015, CKD, DM2, paroxysmal atrial fibrillation, asthma, and ischemic cardiomyopathy with Medtronic ICD.   He admitted with marked volume overload and AKI despite with increase in outpatient diuretics.    Assessment/Plan:    1. Acute on chronic systolic CHF: Ischemic cardiomyopathy, EF 20-25% with RV dysfunction on 2/18 echo pre-LVAD. 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 Medtronic ICD. Overall, he has felt much better post-LVAD. However, he developed AKI and was eventually admitted in 5/18 after RHC showed elevated filling pressures. He was diuresed and creatinine came down. Since then, we have adjusted up and down his torsemide dose, with higher dose volume improves but creatinine worsens. With lower dose we can make creatinine look better but he gets volume overloaded.  He has again developed worsening volume overload with AKI, creatinine up to 5.79.  He had RHC on 8/10 as above and milrinone 0.125 started, increased to 0.25 on 8/11.  Speed increased to 9600 on 8/10.   - Main issue is severe RV failure. CVVHD stopped 8/17.  Tolerating iHD well.  - Still making a decent amount of urine - Volume status and VAD parameters stable.  - No ASA with h/o GI bleeding => holding warfarin for AVF tomorrow. Continue heparin gtt while  INR subtherapeutic. D/w PharmD 2. CAD: s/p CABG -No CP. Continue atorvastatin. No change.  3. AKI on CKD stage IV:  - CVVHD stopped 8/17 - Tolerating iHD well. Still making a decent amount of urine - For  tunneled cath and AVF fistula with VVS tomorrow followed by iHD - Will need to arrange outpatient HD center 4. Atrial fibrillation: Chronic. Warfarin held for now for access procedures, on heparin gtt.  - Rate controlled. Off coumadin for OR tomorrow Continue heparin.      - Will need coumadin again once all access issues are cleared up.  5. RV failure: Continue Revatio 40 mg TID.  Now off milrinone.  6. HTN:   Stable. Off amlodipine. Remains on hydralazine 10 mg every 8 hours.    7. Anemia: Baseline anemia of renal disease/chronic disease but recent bleeding from colonic AVMs. No overt bleeding, may be primarily anemia of renal disease at this point. FOBT negative.  - Hgb 8.7 stable  8. 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 - No overt bleeding. Suspect related to anemia of renal disease. FOBT negative this admission.  - Continue octreotide injections. 9. Skin cancer: On neck and RLE. Needs excision, does not have to stop warfarin. Will address further as outpatient. No change.  WOC assessed biopsy sites  10. Thrombocytopenia: Chronic, mild, stable. No bleeding.  11. Diabetes-  Continue lantus. Follow CBGs closely.     I reviewed the LVAD parameters from today, and compared the results to the patient's prior recorded data.  No programming changes were made.  The LVAD is functioning within specified parameters.  The patient performs LVAD self-test daily.  LVAD interrogation was negative for any significant power changes, alarms or PI events/speed drops.  LVAD equipment check completed and is in good working order.  Back-up equipment present.   LVAD education done on emergency procedures and precautions and reviewed exit site care.  Length of Stay:  16  Glori Bickers, MD 06/29/2017, 1:02 PM  VAD Team --- VAD ISSUES ONLY--- Pager [phone] (7am - 7am)  Advanced Heart Failure Team  Pager [phone] (M-F; 7a - 4p)  Please contact [facility] for night-coverage after hours (4p -7a ) and weekends on [email or web address]

## 2017-06-30 ENCOUNTER — Encounter (HOSPITAL_COMMUNITY)
Admission: AD | Disposition: A | Discharge status: Home Health | Payer: Self-pay | Source: Ambulatory Visit | Attending: Cardiology

## 2017-06-30 ENCOUNTER — Inpatient Hospital Stay (HOSPITAL_COMMUNITY): Payer: Medicare Other | Admitting: Certified Registered Nurse Anesthetist

## 2017-06-30 ENCOUNTER — Inpatient Hospital Stay (HOSPITAL_COMMUNITY): Payer: Medicare Other

## 2017-06-30 ENCOUNTER — Encounter (HOSPITAL_COMMUNITY): Payer: Medicare Other

## 2017-06-30 HISTORY — PX: DIALYSIS/PERMA CATHETER INSERTION: CATH118288

## 2017-06-30 HISTORY — PX: AV FISTULA PLACEMENT: SHX1204

## 2017-06-30 LAB — GLUCOSE, CAPILLARY
GLUCOSE-CAPILLARY: 80 mg/dL (ref 65–99)
GLUCOSE-CAPILLARY: 140 mg/dL — AB (ref 65–99)
GLUCOSE-CAPILLARY: 248 mg/dL — AB (ref 65–99)
Glucose-Capillary: 84 mg/dL (ref 65–99)
Glucose-Capillary: 115 mg/dL — ABNORMAL HIGH (ref 65–99)

## 2017-06-30 LAB — CBC
HCT: 26.4 % — ABNORMAL LOW (ref 39.0–52.0)
HEMOGLOBIN: 8.5 g/dL — AB (ref 13.0–17.0)
MCH: 31.1 pg (ref 26.0–34.0)
MCHC: 32.2 g/dL (ref 30.0–36.0)
MCV: 96.7 fL (ref 78.0–100.0)
PLATELETS: 107 10*3/uL — AB (ref 150–400)
RBC: 2.73 MIL/uL — AB (ref 4.22–5.81)
RDW: 18.2 % — ABNORMAL HIGH (ref 11.5–15.5)
WBC: 10.9 10*3/uL — ABNORMAL HIGH (ref 4.0–10.5)

## 2017-06-30 LAB — RENAL FUNCTION PANEL
ALBUMIN: 3.5 g/dL (ref 3.5–5.0)
Anion gap: 12 (ref 5–15)
BUN: 46 mg/dL — ABNORMAL HIGH (ref 6–20)
CHLORIDE: 95 mmol/L — AB (ref 101–111)
CO2: 26 mmol/L (ref 22–32)
CREATININE: 5.72 mg/dL — AB (ref 0.61–1.24)
Calcium: 8.9 mg/dL (ref 8.9–10.3)
GFR calc Af Amer: 10 mL/min — ABNORMAL LOW (ref >60)
GFR calc non Af Amer: 9 mL/min — ABNORMAL LOW (ref >60)
Glucose, Bld: 101 mg/dL — ABNORMAL HIGH (ref 65–99)
POTASSIUM: 3.8 mmol/L (ref 3.5–5.1)
Phosphorus: 4.2 mg/dL (ref 2.5–4.6)
SODIUM: 133 mmol/L — AB (ref 135–145)

## 2017-06-30 LAB — PROTIME-INR
INR: 1.23
PROTHROMBIN TIME: 15.6 s — AB (ref 11.4–15.2)

## 2017-06-30 LAB — HEPARIN LEVEL (UNFRACTIONATED): HEPARIN UNFRACTIONATED: 0.37 [IU]/mL (ref 0.30–0.70)

## 2017-06-30 LAB — LACTATE DEHYDROGENASE: LDH: 223 U/L — AB (ref 98–192)

## 2017-06-30 SURGERY — ARTERIOVENOUS (AV) FISTULA CREATION
Anesthesia: Monitor Anesthesia Care | Site: Chest | Laterality: Left

## 2017-06-30 MED ORDER — LIDOCAINE-EPINEPHRINE (PF) 1 %-1:200000 IJ SOLN
INTRAMUSCULAR | Status: AC
  Filled 2017-06-30: qty 30

## 2017-06-30 MED ORDER — WARFARIN SODIUM 7.5 MG PO TABS
7.5000 mg | ORAL_TABLET | Freq: Once | ORAL | Status: AC
  Administered 2017-06-30: 7.5 mg via ORAL
  Filled 2017-06-30: qty 1

## 2017-06-30 MED ORDER — HEPARIN SODIUM (PORCINE) 1000 UNIT/ML IJ SOLN
INTRAMUSCULAR | Status: DC | PRN
  Administered 2017-06-30: 1000 [IU]

## 2017-06-30 MED ORDER — ALTEPLASE 2 MG IJ SOLR: 2.0000 mg | Freq: Once | INTRAMUSCULAR | Status: DC | PRN

## 2017-06-30 MED ORDER — FENTANYL CITRATE (PF) 250 MCG/5ML IJ SOLN
INTRAMUSCULAR | Status: DC | PRN
  Administered 2017-06-30: 25 ug via INTRAVENOUS

## 2017-06-30 MED ORDER — SODIUM CHLORIDE 0.9 % IV SOLN
INTRAVENOUS | Status: DC | PRN
  Administered 2017-06-30: 500 mL

## 2017-06-30 MED ORDER — CEFAZOLIN SODIUM 1 G IJ SOLR
INTRAMUSCULAR | Status: AC
  Filled 2017-06-30: qty 20

## 2017-06-30 MED ORDER — HEPARIN SODIUM (PORCINE) 1000 UNIT/ML DIALYSIS: 1000.0000 [IU] | INTRAMUSCULAR | Status: DC | PRN

## 2017-06-30 MED ORDER — HEMOSTATIC AGENTS (NO CHARGE) OPTIME
TOPICAL | Status: DC | PRN
  Administered 2017-06-30: 1 via TOPICAL

## 2017-06-30 MED ORDER — PROMETHAZINE HCL 25 MG/ML IJ SOLN: 6.2500 mg | INTRAMUSCULAR | Status: DC | PRN

## 2017-06-30 MED ORDER — PHENYLEPHRINE 40 MCG/ML (10ML) SYRINGE FOR IV PUSH (FOR BLOOD PRESSURE SUPPORT)
PREFILLED_SYRINGE | INTRAVENOUS | Status: AC
  Filled 2017-06-30: qty 10

## 2017-06-30 MED ORDER — PENTAFLUOROPROP-TETRAFLUOROETH EX AERO: 1.0000 "application " | INHALATION_SPRAY | CUTANEOUS | Status: DC | PRN

## 2017-06-30 MED ORDER — HEPARIN SODIUM (PORCINE) 1000 UNIT/ML DIALYSIS: 40.0000 [IU]/kg | Freq: Once | INTRAMUSCULAR | Status: DC

## 2017-06-30 MED ORDER — HEPARIN (PORCINE) IN NACL 100-0.45 UNIT/ML-% IJ SOLN
1450.0000 [IU]/h | INTRAMUSCULAR | Status: DC
  Administered 2017-07-01 (×2): 1450 [IU]/h via INTRAVENOUS
  Filled 2017-06-30 (×2): qty 250

## 2017-06-30 MED ORDER — CEFAZOLIN SODIUM-DEXTROSE 2-3 GM-% IV SOLR
INTRAVENOUS | Status: DC | PRN
  Administered 2017-06-30: 2 g via INTRAVENOUS

## 2017-06-30 MED ORDER — PHENYLEPHRINE HCL 10 MG/ML IJ SOLN
INTRAMUSCULAR | Status: DC | PRN
  Administered 2017-06-30: 60 ug via INTRAVENOUS
  Administered 2017-06-30 (×2): 80 ug via INTRAVENOUS

## 2017-06-30 MED ORDER — WARFARIN - PHARMACIST DOSING INPATIENT
Freq: Every day | Status: DC
  Administered 2017-07-03: 18:00:00

## 2017-06-30 MED ORDER — OXYCODONE HCL 5 MG/5ML PO SOLN: 5.0000 mg | Freq: Once | ORAL | Status: DC | PRN

## 2017-06-30 MED ORDER — LIDOCAINE 2% (20 MG/ML) 5 ML SYRINGE
INTRAMUSCULAR | Status: AC
  Filled 2017-06-30: qty 10

## 2017-06-30 MED ORDER — BACITRACIN ZINC 500 UNIT/GM EX OINT
TOPICAL_OINTMENT | CUTANEOUS | Status: AC
  Filled 2017-06-30: qty 28.35

## 2017-06-30 MED ORDER — OXYCODONE HCL 5 MG PO TABS: 5.0000 mg | ORAL_TABLET | Freq: Once | ORAL | Status: DC | PRN

## 2017-06-30 MED ORDER — HYDROMORPHONE HCL 1 MG/ML IJ SOLN: 0.2500 mg | INTRAMUSCULAR | Status: DC | PRN

## 2017-06-30 MED ORDER — PROPOFOL 10 MG/ML IV BOLUS
INTRAVENOUS | Status: AC
  Filled 2017-06-30: qty 20

## 2017-06-30 MED ORDER — ROCURONIUM BROMIDE 10 MG/ML (PF) SYRINGE
PREFILLED_SYRINGE | INTRAVENOUS | Status: AC
  Filled 2017-06-30: qty 5

## 2017-06-30 MED ORDER — FENTANYL CITRATE (PF) 250 MCG/5ML IJ SOLN
INTRAMUSCULAR | Status: AC
  Filled 2017-06-30: qty 5

## 2017-06-30 MED ORDER — PROPOFOL 1000 MG/100ML IV EMUL
INTRAVENOUS | Status: AC
  Filled 2017-06-30: qty 100

## 2017-06-30 MED ORDER — HEPARIN SODIUM (PORCINE) 1000 UNIT/ML IJ SOLN
INTRAMUSCULAR | Status: AC
  Filled 2017-06-30: qty 1

## 2017-06-30 MED ORDER — MIDAZOLAM HCL 2 MG/2ML IJ SOLN
INTRAMUSCULAR | Status: AC
Start: 2017-06-30 — End: 2017-06-30
  Filled 2017-06-30: qty 2

## 2017-06-30 MED ORDER — SODIUM CHLORIDE 0.9 % IV SOLN: 100.0000 mL | INTRAVENOUS | Status: DC | PRN

## 2017-06-30 MED ORDER — LIDOCAINE HCL (PF) 1 % IJ SOLN
INTRAMUSCULAR | Status: AC
  Filled 2017-06-30: qty 30

## 2017-06-30 MED ORDER — 0.9 % SODIUM CHLORIDE (POUR BTL) OPTIME
TOPICAL | Status: DC | PRN
  Administered 2017-06-30: 1000 mL

## 2017-06-30 MED ORDER — LIDOCAINE HCL (PF) 1 % IJ SOLN: 5.0000 mL | INTRAMUSCULAR | Status: DC | PRN

## 2017-06-30 MED ORDER — LIDOCAINE HCL (PF) 1 % IJ SOLN
INTRAMUSCULAR | Status: DC | PRN
  Administered 2017-06-30: 4 mL

## 2017-06-30 MED ORDER — LIDOCAINE HCL (CARDIAC) 20 MG/ML IV SOLN
INTRAVENOUS | Status: DC | PRN
  Administered 2017-06-30: 50 mg via INTRATRACHEAL

## 2017-06-30 MED ORDER — LIDOCAINE-PRILOCAINE 2.5-2.5 % EX CREA: 1.0000 "application " | TOPICAL_CREAM | CUTANEOUS | Status: DC | PRN

## 2017-06-30 MED ORDER — PROPOFOL 500 MG/50ML IV EMUL
INTRAVENOUS | Status: DC | PRN
  Administered 2017-06-30: 75 ug/kg/min via INTRAVENOUS

## 2017-06-30 MED ORDER — LIDOCAINE-EPINEPHRINE (PF) 1 %-1:200000 IJ SOLN
INTRAMUSCULAR | Status: DC | PRN
  Administered 2017-06-30: 15 mL

## 2017-06-30 SURGICAL SUPPLY — 50 items
ARMBAND PINK RESTRICT EXTREMIT (MISCELLANEOUS) ×8 IMPLANT
BIOPATCH RED 1 DISK 7.0 (GAUZE/BANDAGES/DRESSINGS) ×3 IMPLANT
BIOPATCH RED 1IN DISK 7.0MM (GAUZE/BANDAGES/DRESSINGS) ×1
CANISTER SUCT 3000ML PPV (MISCELLANEOUS) ×4 IMPLANT
CATH PALINDROME REV 19CM (CATHETERS) ×4 IMPLANT
CLIP TI WIDE RED SMALL 6 (CLIP) ×4 IMPLANT
CLIP VESOCCLUDE MED 6/CT (CLIP) ×4 IMPLANT
CLIP VESOCCLUDE SM WIDE 6/CT (CLIP) ×4 IMPLANT
COVER PROBE W GEL 5X96 (DRAPES) ×4 IMPLANT
DECANTER SPIKE VIAL GLASS SM (MISCELLANEOUS) ×4 IMPLANT
DERMABOND ADVANCED (GAUZE/BANDAGES/DRESSINGS) ×2
DERMABOND ADVANCED .7 DNX12 (GAUZE/BANDAGES/DRESSINGS) ×2 IMPLANT
DRAPE C-ARM 42X72 X-RAY (DRAPES) ×4 IMPLANT
DRAPE CHEST BREAST 15X10 FENES (DRAPES) ×4 IMPLANT
DRSG COVADERM 4X6 (GAUZE/BANDAGES/DRESSINGS) ×4 IMPLANT
ELECT REM PT RETURN 9FT ADLT (ELECTROSURGICAL) ×4
ELECTRODE REM PT RTRN 9FT ADLT (ELECTROSURGICAL) ×2 IMPLANT
GLOVE BIO SURGEON STRL SZ 6.5 (GLOVE) ×3 IMPLANT
GLOVE BIO SURGEON STRL SZ7 (GLOVE) ×16 IMPLANT
GLOVE BIO SURGEONS STRL SZ 6.5 (GLOVE) ×1
GLOVE BIOGEL PI IND STRL 6.5 (GLOVE) ×6 IMPLANT
GLOVE BIOGEL PI IND STRL 7.0 (GLOVE) ×4 IMPLANT
GLOVE BIOGEL PI IND STRL 7.5 (GLOVE) ×6 IMPLANT
GLOVE BIOGEL PI INDICATOR 6.5 (GLOVE) ×6
GLOVE BIOGEL PI INDICATOR 7.0 (GLOVE) ×4
GLOVE BIOGEL PI INDICATOR 7.5 (GLOVE) ×6
GLOVE ECLIPSE 7.0 STRL STRAW (GLOVE) ×4 IMPLANT
GOWN STRL REUS W/ TWL LRG LVL3 (GOWN DISPOSABLE) ×12 IMPLANT
GOWN STRL REUS W/TWL LRG LVL3 (GOWN DISPOSABLE) ×12
HEMOSTAT SPONGE AVITENE ULTRA (HEMOSTASIS) IMPLANT
KIT BASIN OR (CUSTOM PROCEDURE TRAY) ×4 IMPLANT
KIT ROOM TURNOVER OR (KITS) ×4 IMPLANT
NEEDLE 18GX1X1/2 (RX/OR ONLY) (NEEDLE) ×4 IMPLANT
NS IRRIG 1000ML POUR BTL (IV SOLUTION) ×4 IMPLANT
PACK CV ACCESS (CUSTOM PROCEDURE TRAY) ×4 IMPLANT
PAD ARMBOARD 7.5X6 YLW CONV (MISCELLANEOUS) ×8 IMPLANT
SOAP 2 % CHG 4 OZ (WOUND CARE) ×4 IMPLANT
SUT ETHILON 3 0 PS 1 (SUTURE) ×4 IMPLANT
SUT MNCRL AB 4-0 PS2 18 (SUTURE) ×8 IMPLANT
SUT PROLENE 6 0 BV (SUTURE) IMPLANT
SUT PROLENE 6 0 CC (SUTURE) ×4 IMPLANT
SUT PROLENE 7 0 BV 1 (SUTURE) ×8 IMPLANT
SUT VIC AB 3-0 SH 27 (SUTURE) ×2
SUT VIC AB 3-0 SH 27X BRD (SUTURE) ×2 IMPLANT
SYR 10ML LL (SYRINGE) ×4 IMPLANT
SYR 20CC LL (SYRINGE) ×4 IMPLANT
SYR 3ML LL SCALE MARK (SYRINGE) ×4 IMPLANT
SYR CONTROL 10ML LL (SYRINGE) ×4 IMPLANT
UNDERPAD 30X30 (UNDERPADS AND DIAPERS) ×4 IMPLANT
WATER STERILE IRR 1000ML POUR (IV SOLUTION) ×4 IMPLANT

## 2017-06-30 NOTE — H&P (View-Only) (Signed)
[name]  Subjective  - feels ok   Objective (!) 106/94 75 98.4 F (36.9 C) (Oral) 12 99%  Intake/Output Summary (Last 24 hours) at 06/27/17 0943 Last data filed at 06/27/17 0758  Gross per 24 hour  Intake              553 ml  Output             2400 ml  Net            -1847 ml   Right sider pacer No pulse left radial brachial  Assessment/Planning: Vein map reviewed.  Should have adequate vein for left brachial cephalic and most likely radial cephalic although not all of the vein was visualized.  He will also need tunneled dialysis catheter.  Currently has temp cath on left side.  He has a pacer on the right side.  Preop order written for Monday  Discussed with Dr Deterding holding off dialysis on Monday until after operation   Ruta Hinds 06/27/2017 9:43 AM --  Laboratory Lab Results:  Recent Labs  06/26/17 0412 06/27/17 0458  WBC 13.6* 14.4*  HGB 8.4* 8.2*  HCT 26.5* 25.9*  PLT 87* 104*   BMET  Recent Labs  06/26/17 0412 06/27/17 0458  NA 130* 132*  K 3.5 3.8  CL 94* 94*  CO2 26 26  GLUCOSE 210* 167*  BUN 27* 38*  CREATININE 3.76* 5.03*  CALCIUM 8.5* 8.9    COAG Lab Results  Component Value Date   INR 1.21 06/27/2017   INR 1.23 06/26/2017   INR 1.23 06/25/2017   No results found for: PTT

## 2017-06-30 NOTE — Progress Notes (Signed)
LVAD Coordinator Rounding Note:  Admitted 06/13/17 due to RV failure, marked volume overload.   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 Darcey Nora under Destination Therapy criteria.  Planning for outpatient iHD. Left brachiocephalic AV fistula placed 06/30/17. Right IJ tunneled dialysis catheter placed 06/30/17.  Vital signs: HR: 64 AFib Doppler Pressure: 106 Automatic BP: 104/81 (91) O2 Sat: 100% on RA Wt in lbs: 197>197>199>199>196>190>185>184>179>183>185  LVAD interrogation reveals:  Speed:9600 Flow: 5.3 Power: 6.1w PI: 4.5 Alarms: none Events: 8/25- 5 PI events, 8/26- 2 PI events, 8/27- 1 PI event Fixed speed: 9600 Low speed limit: 9000  Drive Line: Sorbaview dressing dry and intact; needs weekly dressing changes. Next due 8/27.Will change today personally.  Labs:  LDH trend: 317>270>295>255>271>293>304>297>260>272>251>235>223  INR trend: 2.15>2.3>2.04>1.94>1.79>1.66>1.95>1.72>1.43>1.27>1.23>1.21>1.23>1.19>1.23  Creat trend: 4.79>5.80>5.73>5.79>6.08>6.56>6.73>6.74>4.84>3.43>2.96>2.50>2.37>5.39>3.78>4.70>3.76>5.03>3.15>4.69>5.72    Blood Transfusion: 06/14/17 - 2 units PRBCs 06/22/17- 1 unit PRBC  Renal: -temporary dialysis catheter placed 06/17/17 -CVVH initiated 06/17/17 stopped 8/18 -HD started 06/23/17, planning for m,w,f   Gtts: Heparin   Anticoagulation Plan: -INR Goal: 2.0 - 2.5 - warfarin restarted 8/27 -ASA Dose: 81 mg daily - on hold  Device: -Medtronic single lead -Therapies: on   Plan/Recommendations:   1. Plan to arrange outpatient HD 2. Dialysis today. 3. Please page VAD coordinator for equipment issues or patient concerns 4. Will change drive-line dressing today  Balinda Quails RN, VAD Coordinator 24/7 pager [phone] .

## 2017-06-30 NOTE — Interval H&P Note (Signed)
   History and Physical Update  The patient was interviewed and re-examined.  [name] previous History and Physical has been reviewed and is unchanged from Dr. Nona Dell consult.  There is no change in the plan of care: TDC exchange, L BC AVF.   Risk, benefits, and alternatives to access surgery were discussed.    The patient is aware the risks include but are not limited to: bleeding, infection, steal syndrome, nerve damage, ischemic monomelic neuropathy, thrombosis, failure to mature, complications related to venous hypertension, need for additional procedures, death and stroke.    The patient is aware the risks of tunneled dialysis catheter placement include but are not limited to: bleeding, infection, central venous injury, pneumothorax, possible venous stenosis, possible malpositioning in the venous system, and possible infections related to long-term catheter presence.   The patient was aware of these risks and agreed to proceed.   Adele Barthel, MD, FACS [facility] Office: [phone] Pager: [phone]  06/30/2017, 6:58 AM

## 2017-06-30 NOTE — Anesthesia Postprocedure Evaluation (Signed)
Anesthesia Post Note  Patient: Trevor [name]  Procedure(s) Performed: Procedure(s) (LRB): CREATION of LEFT ARM Brachiocephalic Fistula (Left) INSERTION Dialysis Catheter     Patient location during evaluation: PACU Anesthesia Type: MAC Level of consciousness: awake and alert Pain management: pain level controlled Vital Signs Assessment: post-procedure vital signs reviewed and stable Respiratory status: spontaneous breathing, nonlabored ventilation and respiratory function stable Cardiovascular status: stable and blood pressure returned to baseline Anesthetic complications: no    Last Vitals:  Vitals:   06/30/17 1027 06/30/17 1028  BP:  104/81  Pulse: 64 64  Resp: 11 13  Temp:  36.6 C  SpO2: 99% 100%    Last Pain:  Vitals:   06/30/17 0959  TempSrc:   PainSc: 0-No pain                 Lynda Rainwater

## 2017-06-30 NOTE — Anesthesia Preprocedure Evaluation (Signed)
Anesthesia Evaluation  Patient identified by MRN, date of birth, ID band Patient awake    Reviewed: Allergy & Precautions, NPO status , Patient's Chart, lab work & pertinent test results  Airway Mallampati: II       Dental  (+) Teeth Intact, Dental Advisory Given   Pulmonary sleep apnea , COPD,    breath sounds clear to auscultation       Cardiovascular + CAD and +CHF  + pacemaker  Rhythm:Regular Rate:Normal - Systolic murmurs    Neuro/Psych    GI/Hepatic   Endo/Other  diabetes  Renal/GU ESRFRenal disease     Musculoskeletal   Abdominal   Peds  Hematology  (+) anemia ,   Anesthesia Other Findings   Reproductive/Obstetrics                             Anesthesia Physical  Anesthesia Plan  ASA: IV  Anesthesia Plan: MAC   Post-op Pain Management:    Induction: Intravenous  PONV Risk Score and Plan: 1 and Ondansetron  Airway Management Planned: Simple Face Mask  Additional Equipment:   Intra-op Plan:   Post-operative Plan:   Informed Consent: I have reviewed the patients History and Physical, chart, labs and discussed the procedure including the risks, benefits and alternatives for the proposed anesthesia with the patient or authorized representative who has indicated his/her understanding and acceptance.     Plan Discussed with: CRNA and Anesthesiologist  Anesthesia Plan Comments:         Anesthesia Quick Evaluation

## 2017-06-30 NOTE — Op Note (Addendum)
OPERATIVE NOTE   PROCEDURE: 1.  left brachiocephalic arteriovenous fistula placement 2.  Right internal jugular vein temporary dialysis catheter exchange for tunneled dialysis catheter   PRE-OPERATIVE DIAGNOSIS: end stage renal disease, end stage cardiac disease  POST-OPERATIVE DIAGNOSIS: same as above   SURGEON: Adele Barthel, MD  ASSISTANT(S): Gerri Lins, PAC   ANESTHESIA: local and MAC  ESTIMATED BLOOD LOSS: 30 cc  FINDING(S): 1.  Cephalic vein: 4 mm, acceptable 2.  Brachial artery: 4-4.5 mm, atherosclerotic disease evident 3.  Venous outflow: atypical thrill  4.  Radial flow: dopplerable radial signal, atypical due to LVAD 5.  Tips of the catheter in the right atrium on fluoroscopy 6.  No obvious pneumothorax on fluoroscopy  SPECIMEN(S):  none  INDICATIONS:   Trevor [name] is a 71 y.o. male with LVAD who presents with end stage renal disease.  The patient is scheduled for left brachiocephalic arteriovenous fistula placement and left tunneled dialysis catheter exchange.  The patient is aware the risks include but are not limited to: bleeding, infection, steal syndrome, nerve damage, ischemic monomelic neuropathy, failure to mature, and need for additional procedures.  The patient is aware the risks of tunneled dialysis catheter placement include but are not limited to: bleeding, infection, central venous injury, pneumothorax, possible venous stenosis, possible malpositioning in the venous system, and possible infections related to long-term catheter presence.  The patient is aware of the risks of the procedure and elects to proceed forward.   DESCRIPTION: After full informed written consent was obtained from the patient, the patient was brought back to the operating room and placed supine upon the operating table.  Prior to induction, the patient received IV antibiotics.   After obtaining adequate anesthesia, the patient was then prepped and draped in the standard  fashion for a left arm access procedure.  I turned my attention first to identifying the patient's cephalic vein and brachial artery.  Using SonoSite guidance, the location of these vessels were marked out on the skin.     At this point, I injected local anesthetic to obtain a field block of the antecubitum.  In total, I injected about 10 mL of 1% lidocaine with epinephrine.  I made a transverse incision at the level of the antecubitum and dissected through the subcutaneous tissue and fascia to gain exposure of the brachial artery.  This was noted to be 4-4.5 mm in diameter externally.  There obvious atherosclerotic disease evident externally This was dissected out proximally and distally and controlled with vessel loops .  I then dissected out the cephalic vein.  This was noted to be 4 mm in diameter externally.  The distal segment of the vein was ligated with a  2-0 silk, and the vein was transected.  The proximal segment was interrogated with serial dilators.  The vein accepted up to a 5 mm dilator without any difficulty.  I then instilled the heparinized saline into the vein and clamped it.  At this point, I reset my exposure of the brachial artery and placed the artery under tension proximally and distally.  I made an arteriotomy with a #11 blade, and then I extended the arteriotomy with a Potts scissor.  There was obvious atherosclerotic calcific plaque in the brachial artery.  I injected heparinized saline proximal and distal to this arteriotomy.  The vein was then sewn to the artery in an end-to-side configuration with a running stitch of 6-0 Prolene.  Prior to completing this anastomosis, I allowed the vein  and artery to backbleed.  There was no evidence of clot from any vessels.  I completed the anastomosis in the usual fashion and then released all vessel loops and clamps.    There was a atypical palpable thrill in the venous outflow, and there was a dopplerable radial signal that was atypical due to  non-pulsatile blood flow due to LVAD.  At this point, I irrigated out the surgical wound.  There was no further active bleeding.  The subcutaneous tissue was reapproximated with a running stitch of 3-0 Vicryl.  The skin was then reapproximated with a running subcuticular stitch of 4-0 Vicryl.  The skin was then cleaned, dried, and reinforced with Dermabond.  The patient tolerated this procedure well.   At this point, the drapes were taken down and then patient repositioned for a tunneled dialysis catheter.  The patient was reprepped and redraped for a chest or neck tunneled dialysis catheter placement.     I turned my attention to the external segment of the prior temporary dialysis catheter.  I transected the numerous securing sutures.  I loaded a J-wire wire into the central lumen and advanced the wire into the inferior vena cava under fluoroscopic guidance.  I exchanged the remaining segment of tunneled dialysis catheter for the medium dilator.  This passed into the left innominate vein without difficulty or kinking.  I exchanged the dilator for the dilator-sheath which I advanced into the right atrium under fluoroscopy.    Then a 27 cm Palindrome tunneled dialysis catheter was loaded through the sheath and advanced into the right atrium under fluoroscopic guidance.    I injected 10 cc of 1% lidocaine with epinephrine in the projected tunnel from the exit site to the neck cannulation site.  I made a new exit site on the chest with a 11-blade.  I dissected with the metal tunneler from the new exit site to the neck incision.  I clamped the back end of the Palindrome catheter, and then transected the catheter revealing the lumens of the catheter.  One lumen of the catheter was loaded onto the metal tunneler   The catheter was pulled with the metal tunneler through the subcutaneous tunnel.  I reclamped the catheter and the transected the back end of the catheter a second time, releasing the metal tunneler.   The catheter collar was loaded over the back end of the catheter.  The two ports were docked onto the two exposed catheter lumens.  The catheter collar was then clicked into place.    Finally, each port was tested by aspirating and flushing.  No resistance was noted.  Each port was then thoroughly flushed with heparinized saline.  The catheter was secured in placed with two interrupted stitches of 3-0 Nylon tied to the catheter.  The neck incision was closed with a U-stitch of 4-0 Monocryl.  The neck and chest incision were cleaned and sterile bandages applied.  Each port was then loaded with concentrated heparin (1000 Units/mL) at the manufacturer recommended volumes to each port.  Sterile caps were applied to each port.    On completion fluoroscopy, the tips of the catheter were in the right atrium, and there was no evidence of pneumothorax.   COMPLICATIONS: none  CONDITION: stable   Adele Barthel, MD, [facility] [place] Office: [phone] Pager: [phone]  06/30/2017, 9:12 AM

## 2017-06-30 NOTE — Transfer of Care (Signed)
Immediate Anesthesia Transfer of Care Note  Patient: Trevor [name]  Procedure(s) Performed: Procedure(s): CREATION of LEFT ARM Brachiocephalic Fistula (Left) INSERTION Dialysis Catheter  Patient Location: PACU  Anesthesia Type:MAC  Level of Consciousness: awake, alert  and oriented  Airway & Oxygen Therapy: Patient Spontanous Breathing and Patient connected to nasal cannula oxygen  Post-op Assessment: Report given to RN, Post -op Vital signs reviewed and stable and Patient moving all extremities X 4  Post vital signs: Reviewed and stable  Last Vitals:  Vitals:   06/30/17 0509 06/30/17 0627  BP: (!) 79/69   Pulse: 72   Resp: 14 15  Temp: 36.7 C   SpO2: 98%     Last Pain:  Vitals:   06/30/17 0627  TempSrc:   PainSc: 0-No pain      Patients Stated Pain Goal: 2 (45/03/88 8280)  Complications: No apparent anesthesia complications

## 2017-06-30 NOTE — Progress Notes (Signed)
ANTICOAGULATION CONSULT NOTE - Follow Up Consult  Pharmacy Consult for Heparin and Coumadin Indication: LVAD  No Known Allergies  Patient Measurements: Height: 5\' 5"  (165.1 cm) Weight: 185 lb 6.5 oz (84.1 kg) IBW/kg (Calculated) : 61.5 Heparin Dosing Weight: 80.7kg  Vital Signs: Temp: 97.3 F (36.3 C) (08/27 1225) Temp Source: Oral (08/27 1225) BP: 93/75 (08/27 1225) Pulse Rate: 85 (08/27 1225)  Labs:  Recent Labs  06/28/17 0416  06/28/17 1051 06/29/17 0500 06/30/17 0500 06/30/17 0530  HGB  --   < > 7.8* 8.7*  --  8.5*  HCT  --   --  23.9* 27.2*  --  26.4*  PLT  --   --  108* 101*  --  107*  LABPROT  --   --  15.6* 15.2 15.6*  --   INR  --   --  1.23 1.19 1.23  --   HEPARINUNFRC 0.38  --   --  0.20* 0.37  --   CREATININE 3.15*  --   --  4.69*  --  5.72*  < > = values in this interval not displayed.  Estimated Creatinine Clearance: 11.8 mL/min (A) (by C-G formula based on SCr of 5.72 mg/dL (H)).  Assessment: 70yom with LVAD, s/p left AV fistula and right TDC today to resume coumadin tonight along with heparin bridge. Heparin to resume 4 hours post surgery end time which was 0950. He was previously therapeutic on 1450 units/hr. Will continue to aim for lower goal. Hgb and platelets low but stable.  PTA dose: 5mg  daily except 7.5mg  on Tuesday  Goal of Therapy:  INR 2-2.5 Heparin level 0.3-0.5 units/ml Monitor platelets by anticoagulation protocol: Yes   Plan:  1) At 1400, resume heparin at 1450 units/hr 2) Coumadin 7.5mg  tonight 3) Daily heparin level, INR, CBC  Deboraha Sprang 06/30/2017,1:05 PM

## 2017-06-30 NOTE — Progress Notes (Signed)
Patient ID: Trevor [name], male   DOB: 1946-07-22, 71 y.o.   MRN: [id number] [facility] Progress Note   Assessment/ Plan:   1. Acute on chronic systolic congestive heart failure secondary to ischemic cardiomyopathy: Status post LVAD with symptomatic improvement and doing even better now status post CRRT/IHD 2.ESRD: Continue hemodialysis on Monday/Wednesday/Friday schedule, he underwent left brachiocephalic fistula creation today as well as conversion of right IJ temporary catheter for a tunneled dialysis catheter. Process initiated for outpatient dialysis unit placement. 3. Anemia: Hemoglobin alert healthy stable, continue Aranesp and monitor for postoperative drops 4. CKD-MBD: Continue sevelamer 3 times a day before meals for phosphorus binding and calcitriol for PTH suppression 5. Nutrition: Continue nutritional supplementation with renal multivitamin/protein supplements 6. Hypertension: Blood pressures on the lower side, continue to monitor on LVAD.  Subjective:   Reports to be feeling fair with some soreness of his left upper arm status post fistula creation    Objective:   BP 93/75   Pulse 85   Temp (!) 97.3 F (36.3 C) (Oral)   Resp 13   Ht 5\' 5"  (1.651 m)   Wt 84.1 kg (185 lb 6.5 oz)   SpO2 100%   BMI 30.85 kg/m   Physical Exam: [id number] resting in bed CVS: Pulse regular rhythm, normal rate, S1 and S2 normal Resp: Decreased breath sounds over bases-no distinct rales or rhonchi Abd: Soft, obese, nontender, LVAD tubes noted Ext: Trace lower extremity edema with dressing over her pretibial right surface.  Labs: BMET  Recent Labs Lab 06/24/17 0500 06/25/17 0426 06/26/17 0412 06/27/17 0458 06/28/17 0416 06/29/17 0500 06/30/17 0530  NA 133* 131* 130* 132* 131* 134* 133*  K 3.7 3.6 3.5 3.8 3.6 3.8 3.8  CL 97* 96* 94* 94* 95* 96* 95*  CO2 27 26 26 26 28 28 26   GLUCOSE 191* 62* 210* 167* 217* 80 101*  BUN 34* 44* 27* 38* 18 33* 46*  CREATININE  3.78* 4.70* 3.76* 5.03* 3.15* 4.69* 5.72*  CALCIUM 8.4* 8.7* 8.5* 8.9 8.2* 8.9 8.9  PHOS 3.2 4.2 2.9 4.1 2.9 4.1 4.2   CBC  Recent Labs Lab 06/27/17 0458 06/28/17 1051 06/29/17 0500 06/30/17 0530  WBC 14.4* 11.6* 11.7* 10.9*  HGB 8.2* 7.8* 8.7* 8.5*  HCT 25.9* 23.9* 27.2* 26.4*  MCV 95.6 95.2 97.1 96.7  PLT 104* 108* 101* 107*    @IMGRELPRIORS @ Medications:    . atorvastatin  40 mg Oral Daily  . budesonide  0.25 mg Nebulization BID  . busPIRone  5 mg Oral BID  . calcitRIOL  0.5 mcg Oral QODAY  . Chlorhexidine Gluconate Cloth  6 each Topical Daily  . darbepoetin (ARANESP) injection - NON-DIALYSIS  150 mcg Subcutaneous Q Fri-1800  . docusate sodium  100 mg Oral BID  . furosemide  160 mg Oral BID  . gabapentin  300 mg Oral QHS  . hydrALAZINE  10 mg Oral Q8H  . insulin aspart  0-15 Units Subcutaneous TID WC  . insulin aspart  0-5 Units Subcutaneous QHS  . insulin glargine  17 Units Subcutaneous QHS  . levothyroxine  25 mcg Oral QAC breakfast  . multivitamin  1 tablet Oral QHS  . neomycin-bacitracin-polymyxin   Topical Daily  . pantoprazole  40 mg Oral Q1200  . rOPINIRole  0.25 mg Oral QHS  . sevelamer carbonate  2.4 g Oral TID WC  . sildenafil  40 mg Oral TID  . sodium chloride flush  10-40 mL Intracatheter Q12H  . warfarin  7.5 mg Oral ONCE-1800  . Warfarin - Pharmacist Dosing Inpatient   Does not apply [id number]   Elmarie Shiley, MD 06/30/2017, 1:06 PM

## 2017-07-01 ENCOUNTER — Encounter (HOSPITAL_COMMUNITY): Payer: Self-pay | Admitting: Vascular Surgery

## 2017-07-01 LAB — RENAL FUNCTION PANEL
ALBUMIN: 3.3 g/dL — AB (ref 3.5–5.0)
ANION GAP: 12 (ref 5–15)
ANION GAP: 8 (ref 5–15)
Albumin: 3.4 g/dL — ABNORMAL LOW (ref 3.5–5.0)
BUN: 56 mg/dL — ABNORMAL HIGH (ref 6–20)
BUN: 20 mg/dL (ref 6–20)
CALCIUM: 8.6 mg/dL — AB (ref 8.9–10.3)
CHLORIDE: 95 mmol/L — AB (ref 101–111)
CO2: 26 mmol/L (ref 22–32)
CO2: 28 mmol/L (ref 22–32)
Calcium: 8.1 mg/dL — ABNORMAL LOW (ref 8.9–10.3)
Chloride: 100 mmol/L — ABNORMAL LOW (ref 101–111)
Creatinine, Ser: 6.48 mg/dL — ABNORMAL HIGH (ref 0.61–1.24)
Creatinine, Ser: 2.71 mg/dL — ABNORMAL HIGH (ref 0.61–1.24)
GFR calc Af Amer: 26 mL/min — ABNORMAL LOW (ref >60)
GFR calc non Af Amer: 8 mL/min — ABNORMAL LOW (ref >60)
GFR calc non Af Amer: 22 mL/min — ABNORMAL LOW (ref >60)
GFR, EST AFRICAN AMERICAN: 9 mL/min — AB (ref >60)
GLUCOSE: 92 mg/dL (ref 65–99)
Glucose, Bld: 118 mg/dL — ABNORMAL HIGH (ref 65–99)
PHOSPHORUS: 2 mg/dL — AB (ref 2.5–4.6)
POTASSIUM: 4 mmol/L (ref 3.5–5.1)
POTASSIUM: 4 mmol/L (ref 3.5–5.1)
Phosphorus: 5 mg/dL — ABNORMAL HIGH (ref 2.5–4.6)
SODIUM: 136 mmol/L (ref 135–145)
Sodium: 133 mmol/L — ABNORMAL LOW (ref 135–145)

## 2017-07-01 LAB — HEPARIN LEVEL (UNFRACTIONATED): Heparin Unfractionated: 0.3 IU/mL (ref 0.30–0.70)

## 2017-07-01 LAB — PREPARE RBC (CROSSMATCH)

## 2017-07-01 LAB — CBC
HCT: 24.3 % — ABNORMAL LOW (ref 39.0–52.0)
HEMOGLOBIN: 7.8 g/dL — AB (ref 13.0–17.0)
MCH: 31.2 pg (ref 26.0–34.0)
MCHC: 32.1 g/dL (ref 30.0–36.0)
MCV: 97.2 fL (ref 78.0–100.0)
Platelets: 99 10*3/uL — ABNORMAL LOW (ref 150–400)
RBC: 2.5 MIL/uL — AB (ref 4.22–5.81)
RDW: 18 % — ABNORMAL HIGH (ref 11.5–15.5)
WBC: 10.5 10*3/uL (ref 4.0–10.5)

## 2017-07-01 LAB — LACTATE DEHYDROGENASE: LDH: 205 U/L — AB (ref 98–192)

## 2017-07-01 LAB — PROTIME-INR
INR: 1.23
PROTHROMBIN TIME: 15.6 s — AB (ref 11.4–15.2)

## 2017-07-01 LAB — GLUCOSE, CAPILLARY
GLUCOSE-CAPILLARY: 172 mg/dL — AB (ref 65–99)
GLUCOSE-CAPILLARY: 179 mg/dL — AB (ref 65–99)
Glucose-Capillary: 105 mg/dL — ABNORMAL HIGH (ref 65–99)
Glucose-Capillary: 249 mg/dL — ABNORMAL HIGH (ref 65–99)

## 2017-07-01 MED ORDER — SODIUM CHLORIDE 0.9 % IV SOLN: Freq: Once | INTRAVENOUS | Status: DC

## 2017-07-01 MED ORDER — WARFARIN SODIUM 7.5 MG PO TABS
7.5000 mg | ORAL_TABLET | Freq: Once | ORAL | Status: AC
  Administered 2017-07-01: 7.5 mg via ORAL
  Filled 2017-07-01: qty 1

## 2017-07-01 NOTE — Care Management Note (Signed)
Case Management Note Previous Note Completed by Irven Shelling RN, BSN Unit 4E-Case Manager-- [place] coverage [phone]  Patient Details  Name: Trevor [name] MRN: [id number] Date of Birth: 06/28/1946  Subjective/Objective:  Pt admitted with Acute on chronic systolic CHF -- placed on IV diuresis  - hx of LVAD placement February 2018              Action/Plan: PTA pt lived at home- followed by HF clinic- CM to follow   Expected Discharge Date:                  Expected Discharge Plan:  [place]  In-House Referral:     Discharge planning Services  CM Consult  Post Acute Care Choice:    Choice offered to:     DME Arranged:    DME Agency:     HH Arranged:    [place] Agency:     Status of Service:  In process, will continue to follow  If discussed at Long Length of Stay Meetings, dates discussed:    Discharge Disposition:   Additional Comments: 07/01/2017  Pt successfully clipped at [facility] on [facility].  Pt is now 1 day  s/p AV fistula and TDC placement.  Pt INR remains below goal at 1.23 - on continuous Heparin   06/30/17 Pt transferred to [place] 8/23.  Pt is ESRD and will likely need long term HD.  Plan is for vein mapping  [name], RN 07/01/2017, 3:22 PM

## 2017-07-01 NOTE — Progress Notes (Signed)
Patient has been set up for outpatient HD at the [facility] on [facility]. VAD Coordinators will begin education process with their staff.   Balinda Quails RN, VAD Coordinator 24/7 pager [phone]

## 2017-07-01 NOTE — Progress Notes (Signed)
Back from dialysis. Changed over to wall for LVAD. C/o severe pain in fistula site on left arm, medicated per orders with Roxicet.

## 2017-07-01 NOTE — Progress Notes (Signed)
HD tx initiated via HD cath w/o problem, pull/push/flush equally w/o problem, VSS but dbp low, will cont to monitor while on HD tx

## 2017-07-01 NOTE — Plan of Care (Signed)
Problem: Coping: Goal: Ability to cope with  disease diagnosis and complications will improve Outcome: Progressing New renal with dialysis

## 2017-07-01 NOTE — Progress Notes (Addendum)
Patient ID: Trevor [name], male   DOB: 08-28-1946, 71 y.o.   MRN: [id number]    Advanced Heart Failure VAD Team Note  Subjective:    Started on CVVHD 06/17/17 via temporary cath.   CVVHD stopped 8/17.   Tunneled HD catheter and AV fistula placed 8/27. Warfarin restarted.   Tolerating iHD so far, last was yesterday pm.  No dizziness, etc.  PI events noted during HD.   No complaints. Denies SOB.   Studies: RHC (06/13/17):  RA = 17 RV = 58/18 PA = 56/25 (37) PCW = 20 (v = 35) Fick cardiac output/index = 6.4/3.2 Thermo CO/CI = 4.1/2.1 PVR = 2.6 Ao sat = 98% PA sat = 56%, 60% Increased LVAD speed to 9600   PCW down to 19  LVAD INTERROGATION:  HeartMate II LVAD:  Flow 5.8  liters/min, speed 9600, power 6.3, PI 4.4.  PI events during HD, otherwise none.   Objective:    Vital Signs:   Temp:  [97.2 F (36.2 C)-98.6 F (37 C)] 98 F (36.7 C) (08/28 0700) Pulse Rate:  [59-89] 64 (08/28 0457) Resp:  [11-19] 12 (08/28 0700) BP: (84-106)/(26-94) 95/72 (08/28 0700) SpO2:  [94 %-100 %] 95 % (08/28 0457) Weight:  [186 lb 4.6 oz (84.5 kg)-188 lb 7.9 oz (85.5 kg)] 186 lb 4.6 oz (84.5 kg) (08/28 0457) Last BM Date: 06/30/17 Mean arterial Pressure 80s       Intake/Output:   Intake/Output Summary (Last 24 hours) at 07/01/17 0851 Last data filed at 07/01/17 0450  Gross per 24 hour  Intake               89 ml  Output             2155 ml  Net            -2066 ml     Physical Exam      Physical Exam: General:  NAD.  HEENT: normal  Neck: supple. JVP 8-9 cm.  Carotids 2+ bilat; no bruits. No lymphadenopathy or thryomegaly appreciated. Cor: LVAD hum.  Lungs: Clear. Abdomen: obese soft, nontender, non-distended. No hepatosplenomegaly. No bruits or masses. Good bowel sounds. Driveline site clean. Anchor in place.  Extremities: no cyanosis, clubbing, rash. Trace edema  Neuro: alert & oriented x 3. No focal deficits. Moves all 4 without problem    Telemetry    Afib 70s  personally reviewed.   Labs   Basic Metabolic Panel:  Recent Labs Lab 06/28/17 0416 06/29/17 0500 06/30/17 0530 07/01/17 0211 07/01/17 0409  NA 131* 134* 133* 133* 136  K 3.6 3.8 3.8 4.0 4.0  CL 95* 96* 95* 95* 100*  CO2 28 28 26 26 28   GLUCOSE 217* 80 101* 118* 92  BUN 18 33* 46* 56* 20  CREATININE 3.15* 4.69* 5.72* 6.48* 2.71*  CALCIUM 8.2* 8.9 8.9 8.6* 8.1*  PHOS 2.9 4.1 4.2 5.0* 2.0*    Liver Function Tests:  Recent Labs Lab 06/28/17 0416 06/29/17 0500 06/30/17 0530 07/01/17 0211 07/01/17 0409  ALBUMIN 3.3* 3.5 3.5 3.4* 3.3*   No results for input(s): LIPASE, AMYLASE in the last 168 hours. No results for input(s): AMMONIA in the last 168 hours.  CBC:  Recent Labs Lab 06/27/17 0458 06/28/17 1051 06/29/17 0500 06/30/17 0530 07/01/17 0210  WBC 14.4* 11.6* 11.7* 10.9* 10.5  HGB 8.2* 7.8* 8.7* 8.5* 7.8*  HCT 25.9* 23.9* 27.2* 26.4* 24.3*  MCV 95.6 95.2 97.1 96.7 97.2  PLT 104* 108* 101* 107* 99*  INR:  Recent Labs Lab 06/27/17 0458 06/28/17 1051 06/29/17 0500 06/30/17 0500 07/01/17 0406  INR 1.21 1.23 1.19 1.23 1.23    Other results:  EKG:    Imaging   Dg Chest Port 1v Same Day  Result Date: 06/30/2017 CLINICAL DATA:  71 year old male status post left dialysis catheter placement. EXAM: PORTABLE CHEST 1 VIEW COMPARISON:  06/13/2017 FINDINGS: The left ventricular assist device and right-sided single lead PA CT appearance stable position. The patient is status post median sternotomy and aortic valve replacement. There has been interval placement of AE left subclavian dialysis catheter terminating in the right atrium. The cardiomediastinal silhouette is enlarged but stable. There is mild vascular congestion without frank edema. The lungs are clear. No pleural effusion or pneumothorax. No acute osseous abnormalities. IMPRESSION: 1. Interval placement of a left subclavian dialysis catheter terminating at the right atrium. 2. Otherwise stable  examination of the chest. Electronically Signed   By: Kristopher Oppenheim M.D.   On: 06/30/2017 10:29   Dg Fluoro Guide Cv Line-no Report  Result Date: 06/30/2017 Fluoroscopy was utilized by the requesting physician.  No radiographic interpretation.     Medications:     Scheduled Medications: . atorvastatin  40 mg Oral Daily  . budesonide  0.25 mg Nebulization BID  . busPIRone  5 mg Oral BID  . calcitRIOL  0.5 mcg Oral QODAY  . Chlorhexidine Gluconate Cloth  6 each Topical Daily  . darbepoetin (ARANESP) injection - NON-DIALYSIS  150 mcg Subcutaneous Q Fri-1800  . docusate sodium  100 mg Oral BID  . furosemide  160 mg Oral BID  . gabapentin  300 mg Oral QHS  . heparin  40 Units/kg Dialysis Once in dialysis  . hydrALAZINE  10 mg Oral Q8H  . insulin aspart  0-15 Units Subcutaneous TID WC  . insulin aspart  0-5 Units Subcutaneous QHS  . insulin glargine  17 Units Subcutaneous QHS  . levothyroxine  25 mcg Oral QAC breakfast  . multivitamin  1 tablet Oral QHS  . neomycin-bacitracin-polymyxin   Topical Daily  . pantoprazole  40 mg Oral Q1200  . rOPINIRole  0.25 mg Oral QHS  . sevelamer carbonate  2.4 g Oral TID WC  . sildenafil  40 mg Oral TID  . sodium chloride flush  10-40 mL Intracatheter Q12H  . Warfarin - Pharmacist Dosing Inpatient   Does not apply q1800    Infusions: . sodium chloride    . sodium chloride    . sodium chloride    . heparin 1,450 Units/hr (07/01/17 0547)    PRN Medications: sodium chloride, sodium chloride, acetaminophen, albuterol, alteplase, heparin, heparin, lidocaine (PF), lidocaine-prilocaine, ondansetron (ZOFRAN) IV, oxyCODONE, pentafluoroprop-tetrafluoroeth, sodium chloride flush, traMADol, traZODone   Patient Profile   Trevor Ternes Murphyis a 71 y.o.malewith a history of CAD s/p CABG x 4 2010, OSA, AS with TAVR 2015, CKD, DM2, paroxysmal atrial fibrillation, asthma, and ischemic cardiomyopathy with Medtronic ICD.   He admitted with marked  volume overload and AKI despite with increase in outpatient diuretics.    Assessment/Plan:    1. Acute on chronic systolic CHF: Ischemic cardiomyopathy, EF 20-25% with RV dysfunction on 2/18 echo pre-LVAD. 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 Medtronic ICD. Overall, he has felt much better post-LVAD. However, he developed AKI and was eventually admitted in 5/18 after RHC showed elevated filling pressures. He was diuresed and creatinine came down. Since then, we have adjusted up and down his torsemide dose, with higher dose volume improves but creatinine  worsens. With lower dose we can make creatinine look better but he gets volume overloaded.  He has again developed worsening volume overload with AKI, creatinine up to 5.79.  He had RHC on 8/10 as above and milrinone 0.125 started, increased to 0.25 on 8/11.  Speed increased to 9600 on 8/10.  Main issue is severe RV failure. Milrinone stopped and CVVH begun. CVVH stopped 8/17.  Now getting iHD and tolerating so far. MAP stable in 80s.  - No ASA with h/o GI bleeding - Restarted warfarin yesterday with INR goal 2-2.5, stop heparin gtt when INR > 1.8.  - Volume management via HD. He remains on Lasix per renal, probably can stop this but will defer to renal.  2. CAD: s/p CABG.  No CP.  - Continue atorvastatin. No change.  3. AKI on CKD stage IV: CVVH stopped 8/17. Tolerating HD so far, weight remaining stable.  Now has had fistula placed and tunneled catheter.    - Continue iHD.        4. Atrial fibrillation: Chronic. On heparin gtt, warfarin restarted.    5. RV failure: Continue Revatio 40 mg TID.  Now off milrinone.  6. HTN: Stable. Off amlodipine. Remains on hydralazine 10 mg every 8 hours, will need to hold on HD days.    7. Anemia: Baseline anemia of renal disease/chronic disease but recent bleeding from colonic AVMs. No overt bleeding, may be primarily anemia of renal disease at this point. FOBT negative.  8. 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 - No overt bleeding. Suspect related to anemia of renal disease. FOBT negative this admission.  - Continue octreotide injections. - No ASA, warfarin INR goal 2-2.5.  9. Skin cancer: On neck and RLE. Needs excision, does not have to stop warfarin. Will address further as outpatient. No change.  WOC assessed biopsy sites  10. Thrombocytopenia: Chronic, mild, stable.  11. Diabetes: Stable.    Home when INR therapeutic and we can stop heparin gtt.  Ambulate on unit today.   I reviewed the LVAD parameters from today, and compared the results to the patient's prior recorded data.  No programming changes were made.  The LVAD is functioning within specified parameters.  The patient performs LVAD self-test daily.  LVAD interrogation was negative for any significant power changes, alarms or PI events/speed drops.  LVAD equipment check completed and is in good working order.  Back-up equipment present.   LVAD education done on emergency procedures and precautions and reviewed exit site care.  Length of Stay: 83  Loralie Champagne, MD 07/01/2017, 8:51 AM  VAD Team --- VAD ISSUES ONLY--- Pager [phone] (7am - 7am)  Advanced Heart Failure Team  Pager [phone] (M-F; 7a - 4p)  Please contact [facility] for night-coverage after hours (4p -7a ) and weekends on [email or web address]

## 2017-07-01 NOTE — Progress Notes (Signed)
ANTICOAGULATION CONSULT NOTE - Follow Up Consult  Pharmacy Consult for Heparin and Coumadin Indication: LVAD  No Known Allergies  Patient Measurements: Height: 5\' 5"  (165.1 cm) Weight: 186 lb 4.6 oz (84.5 kg) IBW/kg (Calculated) : 61.5 Heparin Dosing Weight: 80.7kg  Vital Signs: Temp: 98 F (36.7 C) (08/28 0700) Temp Source: Oral (08/28 0700) BP: 95/72 (08/28 0700) Pulse Rate: 64 (08/28 0457)  Labs:  Recent Labs  06/29/17 0500 06/30/17 0500 06/30/17 0530 07/01/17 0210 07/01/17 0211 07/01/17 0406 07/01/17 0409  HGB 8.7*  --  8.5* 7.8*  --   --   --   HCT 27.2*  --  26.4* 24.3*  --   --   --   PLT 101*  --  107* 99*  --   --   --   LABPROT 15.2 15.6*  --   --   --  15.6*  --   INR 1.19 1.23  --   --   --  1.23  --   HEPARINUNFRC 0.20* 0.37  --   --   --   --  0.30  CREATININE 4.69*  --  5.72*  --  6.48*  --  2.71*    Estimated Creatinine Clearance: 25 mL/min (A) (by C-G formula based on SCr of 2.71 mg/dL (H)).   Medications: Heparin @ 1450 units/hr  Assessment: 70yom with LVAD resumed on heparin and coumadin yesterday after left AV fistula and right TDC placement. Heparin level is therapeutic at 0.3. INR below goal 1.23. Hgb/platelets low but stable. LDH ok. No overt bleeding.  PTA dose: 5mg  daily except 7.5mg  on Tuesday  Goal of Therapy:  INR 2-2.5 Heparin level 0.3-0.5 units/ml Monitor platelets by anticoagulation protocol: Yes   Plan:  1) Continue heparin at 1450 units/hr 2) Repeat coumadin 7.5mg  tonight 3) Daily heparin level, INR, CBC  Deboraha Sprang 07/01/2017,11:01 AM

## 2017-07-01 NOTE — Progress Notes (Signed)
Transported to dialysis  Via bed with battery pack and backup battery report called to dialysis nurse

## 2017-07-01 NOTE — Progress Notes (Signed)
HD tx completed @0450  w/o problem other than after pre tx and tx initiatlized bp, I was unable to get bp to register on HD machine anymore. Primary nurse sent up manual bp cuff and doppler they had been using, made Dr. Lorrene Reid aware of situation, goal was lowered to 1L.  Uf goal met, blood rinsed back, VSS w/ dopplered bp.  Report called to Lanier Ensign, RN

## 2017-07-01 NOTE — Progress Notes (Signed)
Patient ID: Trevor [name], male   DOB: Nov 15, 1945, 71 y.o.   MRN: [id number]  [facility] Progress Note   Assessment/ Plan:   1. Acute on chronic systolic congestive heart failure secondary to ischemic cardiomyopathy: Status post LVAD with symptomatic improvement and now on chronic hemodialysis 2.ESRD: Continue hemodialysis on Monday/Wednesday/Friday schedule, yesterday, status post left BCF and right IJ TDC with some postoperative pain as expected-he has some swelling over left BCF surgical site which may be within the expected range postoperatively-await vascular surgery input. Awaiting outpatient dialysis unit placement. 3. Anemia: Mild drop of hemoglobin noted overnight-will transfuse 1 unit with hemodialysis tomorrow 4. CKD-MBD: Continue sevelamer 3 times a day before meals for phosphorus binding and calcitriol for PTH suppression 5. Nutrition: Continue nutritional supplementation with renal multivitamin/protein supplements 6. Hypertension: Blood pressures on the lower side, continue to monitor on LVAD.  Subjective:   Reports severe pain and sensitivity to touch over left brachiocephalic fistula site. Underwent hemodialysis earlier this morning uneventfully.    Objective:   BP 95/72 (BP Location: Right Arm)   Pulse 64   Temp 98 F (36.7 C) (Oral)   Resp 12   Ht 5\' 5"  (1.651 m)   Wt 84.5 kg (186 lb 4.6 oz)   SpO2 95%   BMI 31.00 kg/m   Physical Exam: EVO:[id number] resting in bed CVS: Pulse regular rhythm, normal rate, S1 and S2 normal Resp: Decreased breath sounds over bases-no distinct rales or rhonchi Abd: Soft, obese, nontender, LVAD tubes noted Ext: Trace ankle edema, intact dressing right pretibial. Left BCF surgical site intact with mild erythema and some associated swelling.   Labs: BMET  Recent Labs Lab 06/26/17 0412 06/27/17 0458 06/28/17 0416 06/29/17 0500 06/30/17 0530 07/01/17 0211 07/01/17 0409  NA 130* 132* 131* 134* 133* 133* 136  K  3.5 3.8 3.6 3.8 3.8 4.0 4.0  CL 94* 94* 95* 96* 95* 95* 100*  CO2 26 26 28 28 26 26 28   GLUCOSE 210* 167* 217* 80 101* 118* 92  BUN 27* 38* 18 33* 46* 56* 20  CREATININE 3.76* 5.03* 3.15* 4.69* 5.72* 6.48* 2.71*  CALCIUM 8.5* 8.9 8.2* 8.9 8.9 8.6* 8.1*  PHOS 2.9 4.1 2.9 4.1 4.2 5.0* 2.0*   CBC  Recent Labs Lab 06/28/17 1051 06/29/17 0500 06/30/17 0530 07/01/17 0210  WBC 11.6* 11.7* 10.9* 10.5  HGB 7.8* 8.7* 8.5* 7.8*  HCT 23.9* 27.2* 26.4* 24.3*  MCV 95.2 97.1 96.7 97.2  PLT 108* 101* 107* 99*   Medications:    . atorvastatin  40 mg Oral Daily  . budesonide  0.25 mg Nebulization BID  . busPIRone  5 mg Oral BID  . calcitRIOL  0.5 mcg Oral QODAY  . Chlorhexidine Gluconate Cloth  6 each Topical Daily  . darbepoetin (ARANESP) injection - NON-DIALYSIS  150 mcg Subcutaneous Q Fri-1800  . docusate sodium  100 mg Oral BID  . furosemide  160 mg Oral BID  . gabapentin  300 mg Oral QHS  . heparin  40 Units/kg Dialysis Once in dialysis  . hydrALAZINE  10 mg Oral Q8H  . insulin aspart  0-15 Units Subcutaneous TID WC  . insulin aspart  0-5 Units Subcutaneous QHS  . insulin glargine  17 Units Subcutaneous QHS  . levothyroxine  25 mcg Oral QAC breakfast  . multivitamin  1 tablet Oral QHS  . neomycin-bacitracin-polymyxin   Topical Daily  . pantoprazole  40 mg Oral Q1200  . rOPINIRole  0.25 mg Oral QHS  .  sevelamer carbonate  2.4 g Oral TID WC  . sildenafil  40 mg Oral TID  . sodium chloride flush  10-40 mL Intracatheter Q12H  . Warfarin - Pharmacist Dosing Inpatient   Does not apply [id number]   Elmarie Shiley, MD 07/01/2017, 8:01 AM

## 2017-07-01 NOTE — Progress Notes (Addendum)
  Progress Note  SUBJECTIVE:    POD #1  Complaining of pain at left antecubital incision site. Denies any left hand pain or numbness.  OBJECTIVE:   Vitals:   07/01/17 0457 07/01/17 0700  BP:  95/72  Pulse: 64   Resp: 19 12  Temp: 97.7 F (36.5 C) 98 F (36.7 C)  SpO2: 95%     Intake/Output Summary (Last 24 hours) at 07/01/17 0844 Last data filed at 07/01/17 0450  Gross per 24 hour  Intake               89 ml  Output             2155 ml  Net            -2066 ml   Left antecubital incision is clean and intact. Very mild swelling and erythema here. No hematoma. Palpable thrill in fistula.  Non palpable left radial pulse but hand is warm and well perfused. Sensation and motor function intact left hand.   ASSESSMENT/PLAN:   71 y.o. male is s/p: left brachial-cephalic AV fistula  1 Day Post-Op   Fistula is patent. No hematoma or active bleeding left antecubital space. Incision intact. Some mild erythema but too early for infection.  No signs of steal syndrome.  Follow-up in 4-6 weeks to check maturation of fistula.   Alvia Grove 07/01/2017 8:44 AM -- LABS:   CBC    Component Value Date/Time   WBC 10.5 07/01/2017 0210   HGB 7.8 (L) 07/01/2017 0210   HCT 24.3 (L) 07/01/2017 0210   HCT 26.1 (L) 12/08/2016 0334   PLT 99 (L) 07/01/2017 0210    BMET    Component Value Date/Time   NA 136 07/01/2017 0409   K 4.0 07/01/2017 0409   CL 100 (L) 07/01/2017 0409   CO2 28 07/01/2017 0409   GLUCOSE 92 07/01/2017 0409   BUN 20 07/01/2017 0409   CREATININE 2.71 (H) 07/01/2017 0409   CALCIUM 8.1 (L) 07/01/2017 0409   GFRNONAA 22 (L) 07/01/2017 0409   GFRAA 26 (L) 07/01/2017 0409    COAG Lab Results  Component Value Date   INR 1.23 07/01/2017   INR 1.23 06/30/2017   INR 1.19 06/29/2017   No results found for: PTT  ANTIBIOTICS:   Anti-infectives    Start     Dose/Rate Route Frequency Ordered Stop   06/30/17 0600  cefUROXime (ZINACEF) 1.5 g in dextrose 5 %  50 mL IVPB     1.5 g 100 mL/hr over 30 Minutes Intravenous To Surgery 06/27/17 0953 07/01/17 0600       Virgina Jock, PA-C [facility] Office: [phone] Pager: [phone] 07/01/2017 8:44 AM  Addendum  I have independently interviewed and examined the patient, and I agree with the physician assistant's findings.  F/U 6 weeks to check on fistula.  Adele Barthel, MD, FACS [facility] [place] Office: [phone] Pager: [phone]  07/01/2017, 10:43 AM

## 2017-07-01 NOTE — Progress Notes (Signed)
LVAD Coordinator Rounding Note:  Admitted 06/13/17 due to RV failure, marked volume overload.   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 Darcey Nora under Destination Therapy criteria.  Planning for outpatient iHD. Personally called outpatient dialysis coordinator today to assist with arrangements.   Left brachiocephalic AV fistula placed 06/30/17. Right IJ tunneled dialysis catheter placed 06/30/17.  Vital signs: HR: 70AFib Doppler Pressure: 81 Automatic BP: 95/72 (81) O2 Sat: 100% on RA Wt in lbs: 197>197>199>199>196>190>185>184>179>183>185>186  LVAD interrogation reveals:  Speed:9600 Flow: 6.3 Power: 6.7w PI: 3.1 Alarms: none Events:8/28 58 PI events noted during hours of HD treatment Fixed speed: 9600 Low speed limit: 9000  Drive Line: Sorbaview dressing dry and intact; needs weekly dressing changes. Next due 07/07/17  Labs:  LDH trend: 317>270>295>255>271>293>304>297>260>272>251>235>223>205  INR trend: 2.15>2.3>2.04>1.94>1.79>1.66>1.95>1.72>1.43>1.27>1.23>1.21>1.23>1.19>1.23>1.23  Creat trend: 4.79>5.80>5.73>5.79>6.08>6.56>6.73>6.74>4.84>3.43>2.96>2.50>2.37>5.39>3.78>4.70>3.76>5.03>3.15>4.69>5.72>2.71    Blood Transfusion: 06/14/17 - 2 units PRBCs 06/22/17- 1 unit PRBC  Renal: -temporary dialysis catheter placed 06/17/17 -CVVH initiated 06/17/17 stopped 8/18 -HD started 06/23/17, planning for m,w,f   Gtts: Heparin   Anticoagulation Plan: -INR Goal: 2.0 - 2.5 - warfarin restarted 8/27 -ASA Dose: 81 mg daily - on hold  Device: -Medtronic single lead -Therapies: on   Plan/Recommendations:   1. Plan to arrange outpatient HD. Personally called outpatient HD coordinator to assist with arrangments.  2. Please page VAD coordinator for equipment issues or patient concerns   Balinda Quails RN, [place] Coordinator 24/7 pager [phone]

## 2017-07-02 ENCOUNTER — Encounter (HOSPITAL_COMMUNITY): Payer: Medicare Other

## 2017-07-02 LAB — GLUCOSE, CAPILLARY
GLUCOSE-CAPILLARY: 141 mg/dL — AB (ref 65–99)
GLUCOSE-CAPILLARY: 161 mg/dL — AB (ref 65–99)
Glucose-Capillary: 74 mg/dL (ref 65–99)

## 2017-07-02 LAB — APTT: aPTT: 181 seconds (ref 24–36)

## 2017-07-02 LAB — HEPARIN LEVEL (UNFRACTIONATED): Heparin Unfractionated: 0.35 IU/mL (ref 0.30–0.70)

## 2017-07-02 LAB — RENAL FUNCTION PANEL
ALBUMIN: 3.6 g/dL (ref 3.5–5.0)
Anion gap: 9 (ref 5–15)
BUN: 29 mg/dL — AB (ref 6–20)
CALCIUM: 8.7 mg/dL — AB (ref 8.9–10.3)
CO2: 26 mmol/L (ref 22–32)
CREATININE: 4.06 mg/dL — AB (ref 0.61–1.24)
Chloride: 98 mmol/L — ABNORMAL LOW (ref 101–111)
GFR calc Af Amer: 16 mL/min — ABNORMAL LOW (ref >60)
GFR, EST NON AFRICAN AMERICAN: 14 mL/min — AB (ref >60)
Glucose, Bld: 188 mg/dL — ABNORMAL HIGH (ref 65–99)
PHOSPHORUS: 3.9 mg/dL (ref 2.5–4.6)
POTASSIUM: 4.1 mmol/L (ref 3.5–5.1)
Sodium: 133 mmol/L — ABNORMAL LOW (ref 135–145)

## 2017-07-02 LAB — LACTATE DEHYDROGENASE: LDH: 222 U/L — ABNORMAL HIGH (ref 98–192)

## 2017-07-02 LAB — PROTIME-INR
INR: 1.23
PROTHROMBIN TIME: 15.4 s — AB (ref 11.4–15.2)

## 2017-07-02 LAB — CBC
HCT: 27.1 % — ABNORMAL LOW (ref 39.0–52.0)
HEMOGLOBIN: 8.4 g/dL — AB (ref 13.0–17.0)
MCH: 30.7 pg (ref 26.0–34.0)
MCHC: 31 g/dL (ref 30.0–36.0)
MCV: 98.9 fL (ref 78.0–100.0)
PLATELETS: 98 10*3/uL — AB (ref 150–400)
RBC: 2.74 MIL/uL — AB (ref 4.22–5.81)
RDW: 18.4 % — ABNORMAL HIGH (ref 11.5–15.5)
WBC: 8.3 10*3/uL (ref 4.0–10.5)

## 2017-07-02 MED ORDER — LIDOCAINE HCL (PF) 1 % IJ SOLN
INTRAMUSCULAR | Status: AC
  Administered 2017-07-02: 5 mL
  Filled 2017-07-02: qty 10

## 2017-07-02 MED ORDER — CALCITRIOL 0.5 MCG PO CAPS
ORAL_CAPSULE | ORAL | Status: AC
  Administered 2017-07-02: 11:00:00
  Filled 2017-07-02: qty 1

## 2017-07-02 MED ORDER — HEPARIN (PORCINE) IN NACL 100-0.45 UNIT/ML-% IJ SOLN
1450.0000 [IU]/h | INTRAMUSCULAR | Status: DC
  Administered 2017-07-02 – 2017-07-04 (×5): 1450 [IU]/h via INTRAVENOUS
  Filled 2017-07-02 (×6): qty 250

## 2017-07-02 MED ORDER — WARFARIN SODIUM 10 MG PO TABS
10.0000 mg | ORAL_TABLET | Freq: Once | ORAL | Status: AC
  Administered 2017-07-02: 10 mg via ORAL
  Filled 2017-07-02 (×2): qty 1

## 2017-07-02 NOTE — Progress Notes (Signed)
Pt complaining of worsening pain in left fistula. Site assessed and is red with some swelling and painful to touch, slightly warm. Vascular paged x2 with no return phone call at this time. Will continue to monitor.

## 2017-07-02 NOTE — Progress Notes (Signed)
2 Days Post-Op Procedure(s) (LRB): CREATION of LEFT ARM Brachiocephalic Fistula (Left) INSERTION Dialysis Catheter Subjective: Feels well - for HD today Skin bleeder at [facility] catheter site treated with purse string 3-0 silk  Objective: Vital signs in last 24 hours: Temp:  [96.4 F (35.8 C)-98.6 F (37 C)] 96.4 F (35.8 C) (08/29 0801) Cardiac Rhythm: Atrial fibrillation;Bundle branch block (08/29 0757) Resp:  [10-18] 13 (08/29 0801) BP: (93-110)/(55-89) 110/89 (08/29 0801) SpO2:  [99 %-100 %] 100 % (08/28 2300) Weight:  [184 lb 8.4 oz (83.7 kg)] 184 lb 8.4 oz (83.7 kg) (08/29 0326)  Hemodynamic parameters for last 24 hours:  stable  Intake/Output from previous day: 08/28 0701 - 08/29 0700 In: 1359.5 [P.O.:900; I.V.:459.5] Out: 700 [Urine:700] Intake/Output this shift:        Exam    General- alert and comfortable   Lungs- clear without rales, wheezes   Cor- regular rate and rhythm, no murmur , gallop   Abdomen- soft, non-tender   Extremities - warm, non-tender, minimal edema   Neuro- oriented, appropriate, no focal weakness  Lab Results:  Recent Labs  07/01/17 0210 07/02/17 0231  WBC 10.5 8.3  HGB 7.8* 8.4*  HCT 24.3* 27.1*  PLT 99* 98*   BMET:  Recent Labs  07/01/17 0409 07/02/17 0232  NA 136 133*  K 4.0 4.1  CL 100* 98*  CO2 28 26  GLUCOSE 92 188*  BUN 20 29*  CREATININE 2.71* 4.06*  CALCIUM 8.1* 8.7*    PT/INR:  Recent Labs  07/02/17 0232  LABPROT 15.4*  INR 1.23   ABG    Component Value Date/Time   PHART 7.357 12/17/2016 0415   HCO3 22.7 06/13/2017 1321   HCO3 22.8 06/13/2017 1321   TCO2 24 06/13/2017 1321   TCO2 24 06/13/2017 1321   ACIDBASEDEF 3.0 (H) 06/13/2017 1321   ACIDBASEDEF 3.0 (H) 06/13/2017 1321   O2SAT 62.8 06/18/2017 0820   CBG (last 3)   Recent Labs  07/01/17 1655 07/01/17 2104 07/02/17 0820  GLUCAP 179* 249* 74    Assessment/Plan: S/P Procedure(s) (LRB): CREATION of LEFT ARM Brachiocephalic Fistula  (Left) INSERTION Dialysis Catheter Hold heparin for 1 hour Am PTT 185   LOS: 19 days    Tharon Aquas Trigt III 07/02/2017

## 2017-07-02 NOTE — Progress Notes (Signed)
   Daily Progress Note  Reported bleeding from exit site of [facility].  On exam, as reported, some ooze from exit site.  This was the exact same problem from the temporary dialysis catheter.  Suspect the heparin drip is preventing thrombosis of any bleeding points in the subcutaneous tunnel.  Hence, some degree of bleeding should be expected in this clinical situation.  - options for mgmt: 1. Manual pressure to subcutaneous tunnel for 30 minutes 2. Avitene to exit site 3. Stitch to exit site  - Probably will need all three.  One of my PA will be by later to re-eval the [facility].  Adele Barthel, MD, FACS [facility] [place] Office: [phone] Pager: [phone]  07/02/2017, 7:34 AM

## 2017-07-02 NOTE — Progress Notes (Signed)
Pt bleeding from HD cath insertion site. Called to pt room, pt shirt and dressing saturated in blood with blood slowly oozing/ pooling at insertion site. Earlier in shift at 2020 pt had shadowing and dressing was reinforced MD made aware of this prior event. MD [facility] paged. MD advised to apply pressure to site. ( maybe place saline bag on site). Verbal order received for stat labs ( aptt, CBC and heparin levels.) Dressing changed. Will continue to monitor.

## 2017-07-02 NOTE — Progress Notes (Signed)
Asked to see patient for worsening pain at fistula site  Patient has no evidence of steal symptoms.  No evidence of nerve entrapment.  No evidence of infection.  The fistuly remains patent.  He has swelling and erythema at the site consistent with hematoma.  I have encouraged the patient to keep the arm elevated.  He might benefit from an ice pack to the site.  I do not think exploration of the wound would be of any benefit.  Continue to treat with pain meds.  This should resolve with time.   Annamarie Major

## 2017-07-02 NOTE — Procedures (Signed)
Patient seen on Hemodialysis. QB 400, UF goal 1.8L Treatment adjusted as needed.  Elmarie Shiley MD [facility]. Office # [phone] Pager # [phone] 2:01 PM

## 2017-07-02 NOTE — Progress Notes (Signed)
ANTICOAGULATION CONSULT NOTE - Follow Up Consult  Pharmacy Consult for Heparin and Coumadin Indication: LVAD  No Known Allergies  Patient Measurements: Height: 5\' 5"  (165.1 cm) Weight: 184 lb 8.4 oz (83.7 kg) IBW/kg (Calculated) : 61.5 Heparin Dosing Weight: 80.7kg  Vital Signs: Temp: 97.6 F (36.4 C) (08/29 0930) Temp Source: Oral (08/29 0930) BP: 95/78 (08/29 1100) Pulse Rate: 68 (08/29 1100)  Labs:  Recent Labs  06/30/17 0500  06/30/17 0530 07/01/17 0210 07/01/17 0211 07/01/17 0406 07/01/17 0409 07/02/17 0231 07/02/17 0232  HGB  --   < > 8.5* 7.8*  --   --   --  8.4*  --   HCT  --   --  26.4* 24.3*  --   --   --  27.1*  --   PLT  --   --  107* 99*  --   --   --  98*  --   APTT  --   --   --   --   --   --   --  181*  --   LABPROT 15.6*  --   --   --   --  15.6*  --   --  15.4*  INR 1.23  --   --   --   --  1.23  --   --  1.23  HEPARINUNFRC 0.37  --   --   --   --   --  0.30 0.35  --   CREATININE  --   < > 5.72*  --  6.48*  --  2.71*  --  4.06*  < > = values in this interval not displayed.  Estimated Creatinine Clearance: 16.6 mL/min (A) (by C-G formula based on SCr of 4.06 mg/dL (H)).   Medications: Heparin @ 1450 units/hr  Assessment: 70yom with LVAD resumed on heparin and coumadin 8/27 after left AV fistula and right TDC placement. Heparin level is therapeutic at 0.35. INR below goal 1.23 and not moving. Hgb/platelets low but stable. LDH ok. He had some oozing at his catheter site today which was stitched by Dr. Darcey Nora. No hematoma noted.  PTA dose: 5mg  daily except 7.5mg  on Tuesday  Goal of Therapy:  INR 2-2.5 Heparin level 0.3-0.5 units/ml Monitor platelets by anticoagulation protocol: Yes   Plan:  1) Continue heparin at 1450 units/hr 2) Increase coumadin to 10mg  tonight 3) Daily heparin level, INR, CBC  Deboraha Sprang 07/02/2017,11:44 AM

## 2017-07-02 NOTE — Progress Notes (Signed)
VASCULAR SURGERY  This patient had a right IJ tunneled dialysis catheter and a left upper arm fistula placed 2 days ago by Dr. Bridgett Larsson. Initially the catheter did not have any problem but I was called this morning because the patient was having significant oozing around the catheter site.  On exam there was a large dressing soaked in blood and I removed this. There was some mild oozing around the catheter and I reapplied a dressing which seem to be controlling the small amount of oozing.  The patient is on IV heparin for LVAD which is being managed by the pharmacy. If the oozing continues may need to hold the heparin heparin temporarily.  Deitra Mayo, MD, [place] [phone] Office: [phone]

## 2017-07-02 NOTE — Progress Notes (Signed)
LVAD Coordinator Rounding Note:  Admitted 06/13/17 due to RV failure, marked volume overload.   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 Darcey Nora under Destination Therapy criteria.   Left brachiocephalic AV fistula placed 06/30/17. Right IJ tunneled dialysis catheter placed 06/30/17.  Vital signs: HR: 61AFib Doppler Pressure: 86 Automatic BP: 110/89 (89) O2 Sat: 100% on RA Wt in lbs: 197>197>199>199>196>190>185>184>179>183>185>186>184  LVAD interrogation reveals:  Speed:9600 Flow: 5.5 Power: 6 w PI: 4.7 Alarms: none Events:8/29- 2 PI events Fixed speed: 9600 Low speed limit: 9000  Drive Line: Sorbaview dressing dry and intact; needs weekly dressing changes. Next due 07/07/17  Labs:  LDH trend: 317>270>295>255>271>293>304>297>260>272>251>235>223>205>222  INR trend: 2.15>2.3>2.04>1.94>1.79>1.66>1.95>1.72>1.43>1.27>1.23>1.21>1.23>1.19>1.23>1.23>1.23  Creat trend: 4.79>5.80>5.73>5.79>6.08>6.56>6.73>6.74>4.84>3.43>2.96>2.50>2.37>5.39>3.78>4.70>3.76>5.03>3.15>4.69>5.72>2.71>4.06    Blood Transfusion: 06/14/17 - 2 units PRBCs 06/22/17- 1 unit PRBC 07/02/17- 1 unit PRBC  Renal: -temporary dialysis catheter placed 06/17/17 -CVVH initiated 06/17/17 stopped 8/18 -HD started 06/23/17, planning for m,w,f   Gtts: Heparin   Anticoagulation Plan: -INR Goal: 2.0 - 2.5 - warfarin restarted 8/27 -ASA Dose: 81 mg daily - on hold  Device: -Medtronic single lead -Therapies: on   Plan/Recommendations:   1. Plan to arrange outpatient HD. Education will begin at HD center 8/30. Will plan to attend HD treatment with patient for the first few treatments. 2. Please page VAD coordinator for equipment issues or patient concerns   Balinda Quails RN, [place] Coordinator 24/7 pager [phone]

## 2017-07-02 NOTE — Progress Notes (Signed)
Patient ID: Trevor [name], male   DOB: 07/25/1946, 71 y.o.   MRN: [id number] [place] [facility] Progress Note   Assessment/ Plan:   1. Acute on chronic systolic congestive heart failure secondary to ischemic cardiomyopathy: Status post LVAD with symptomatic improvement and now on chronic hemodialysis 2.ESRD: Continue hemodialysis on Monday/Wednesday/Friday schedule, he will continue dialysis at [facility] upon discharge (LVAD representative to provide them with educational session). Hemodialysis planned for today 3. Anemia: Hemoglobin stable at this time, continue to monitor for overt blood loss 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 Nutrition: Continue nutritional supplementation with renal multivitamin/protein supplements 6. Hypertension: Blood pressures on the lower side, continue to monitor on LVAD.  Subjective:   Developed bleeding from left IJ [facility] exit site and management plans noted from vascular surgery.    Objective:   BP 93/74 (BP Location: Right Arm)   Pulse 64   Temp (!) 97.3 F (36.3 C) (Oral)   Resp 18   Ht 5\' 5"  (1.651 m)   Wt 83.7 kg (184 lb 8.4 oz)   SpO2 100%   BMI 30.71 kg/m   Physical Exam: [id number] resting in bed CVS: Pulse regular rhythm, normal rate, S1 and S2 normal Resp: Anteriorly clear to auscultation, no rales or rhonchi Abd: Soft, obese, nontender, LVAD tubes noted RUQ Ext: Trace ankle edema, intact dressing right pretibial. Left BCF surgical site intact with mild erythema and some associated swelling. Left IJ TDC exit site dressing currently appears dry/clean  Labs: BMET  Recent Labs Lab 06/27/17 0458 06/28/17 0416 06/29/17 0500 06/30/17 0530 07/01/17 0211 07/01/17 0409 07/02/17 0232  NA 132* 131* 134* 133* 133* 136 133*  K 3.8 3.6 3.8 3.8 4.0 4.0 4.1  CL 94* 95* 96* 95* 95* 100* 98*  CO2 26 28 28 26 26 28 26   GLUCOSE 167* 217* 80  101* 118* 92 188*  BUN 38* 18 33* 46* 56* 20 29*  CREATININE 5.03* 3.15* 4.69* 5.72* 6.48* 2.71* 4.06*  CALCIUM 8.9 8.2* 8.9 8.9 8.6* 8.1* 8.7*  PHOS 4.1 2.9 4.1 4.2 5.0* 2.0* 3.9   CBC  Recent Labs Lab 06/29/17 0500 06/30/17 0530 07/01/17 0210 07/02/17 0231  WBC 11.7* 10.9* 10.5 8.3  HGB 8.7* 8.5* 7.8* 8.4*  HCT 27.2* 26.4* 24.3* 27.1*  MCV 97.1 96.7 97.2 98.9  PLT 101* 107* 99* 98*   Medications:    . atorvastatin  40 mg Oral Daily  . budesonide  0.25 mg Nebulization BID  . busPIRone  5 mg Oral BID  . calcitRIOL  0.5 mcg Oral QODAY  . Chlorhexidine Gluconate Cloth  6 each Topical Daily  . darbepoetin (ARANESP) injection - NON-DIALYSIS  150 mcg Subcutaneous Q Fri-1800  . docusate sodium  100 mg Oral BID  . furosemide  160 mg Oral BID  . gabapentin  300 mg Oral QHS  . heparin  40 Units/kg Dialysis Once in dialysis  . hydrALAZINE  10 mg Oral Q8H  . insulin aspart  0-15 Units Subcutaneous TID WC  . insulin aspart  0-5 Units Subcutaneous QHS  . insulin glargine  17 Units Subcutaneous QHS  . levothyroxine  25 mcg Oral QAC breakfast  . multivitamin  1 tablet Oral QHS  . neomycin-bacitracin-polymyxin   Topical Daily  . pantoprazole  40 mg Oral Q1200  . rOPINIRole  0.25 mg Oral QHS  . sevelamer carbonate  2.4 g Oral TID WC  . sildenafil  40  mg Oral TID  . sodium chloride flush  10-40 mL Intracatheter Q12H  . Warfarin - Pharmacist Dosing Inpatient   Does not apply [id number]   Elmarie Shiley, MD 07/02/2017, 7:57 AM

## 2017-07-02 NOTE — Progress Notes (Signed)
VAD coordinators reached out to Trevor [name] and Chief Technology Officer (Publishing copy) at [facility] at: [phone]. Scheduled VAD education with staff on Friday 8/31 and Tues 07/08/17 from 9 - 1 pm. Will also bring VAD resource notebook and "VAD Badge Buddies" for staff. VAD Coordinator plans on being present for [name]'s first dialysis treatment.  Dialysis center is ordering Doppler and manual cuff, if not available in time for dialysis treatment, we will provide a loaner.

## 2017-07-02 NOTE — Progress Notes (Signed)
Patient ID: Trevor [name], male   DOB: 03-14-1946, 71 y.o.   MRN: [id number]    Advanced Heart Failure VAD Team Note  Subjective:    Started on CVVHD 06/17/17 via temporary cath.   CVVHD stopped 8/17.   Tunneled HD catheter and AV fistula placed 8/27. Warfarin restarted.   Tolerating iHD so far, getting HD currently with no problems.  PI events have been noted during HD.   INR remains 1.23, on IV heparin gtt until INR 1.8.   Oozing at tunneled catheter site today, 3 stitches placed and bleeding seems to have slowed/stopped.   Studies: RHC (06/13/17):  RA = 17 RV = 58/18 PA = 56/25 (37) PCW = 20 (v = 35) Fick cardiac output/index = 6.4/3.2 Thermo CO/CI = 4.1/2.1 PVR = 2.6 Ao sat = 98% PA sat = 56%, 60% Increased LVAD speed to 9600   PCW down to 19  LVAD INTERROGATION:  HeartMate II LVAD:  Flow 5.6  liters/min, speed 9600, power 6.2, PI 4.2.     Objective:    Vital Signs:   Temp:  [96.4 F (35.8 C)-98.6 F (37 C)] 96.4 F (35.8 C) (08/29 0801) Resp:  [10-18] 13 (08/29 0801) BP: (93-110)/(55-89) 110/89 (08/29 0801) SpO2:  [99 %-100 %] 100 % (08/28 2300) Weight:  [184 lb 8.4 oz (83.7 kg)] 184 lb 8.4 oz (83.7 kg) (08/29 0326) Last BM Date: 06/30/17 Mean arterial Pressure 80s       Intake/Output:   Intake/Output Summary (Last 24 hours) at 07/02/17 0932 Last data filed at 07/02/17 0907  Gross per 24 hour  Intake           1079.5 ml  Output              700 ml  Net            379.5 ml     Physical Exam    Physical Exam: General: NAD.  HEENT: normal  Neck: supple. JVP 8 cm. Carotids 2+ bilat; no bruits. No lymphadenopathy or thryomegaly appreciated. Cor: LVAD hum.  Lungs: Clear. Abdomen: obese soft, nontender, non-distended. No hepatosplenomegaly. No bruits or masses. Good bowel sounds. Driveline site clean. Anchor in place.  Extremities: no cyanosis, clubbing, rash. Trace edema  Neuro: alert &oriented x 3. No focal deficits. Moves all 4 without problem     Telemetry    Afib 70s-80s personally reviewed.   Labs   Basic Metabolic Panel:  Recent Labs Lab 06/29/17 0500 06/30/17 0530 07/01/17 0211 07/01/17 0409 07/02/17 0232  NA 134* 133* 133* 136 133*  K 3.8 3.8 4.0 4.0 4.1  CL 96* 95* 95* 100* 98*  CO2 28 26 26 28 26   GLUCOSE 80 101* 118* 92 188*  BUN 33* 46* 56* 20 29*  CREATININE 4.69* 5.72* 6.48* 2.71* 4.06*  CALCIUM 8.9 8.9 8.6* 8.1* 8.7*  PHOS 4.1 4.2 5.0* 2.0* 3.9    Liver Function Tests:  Recent Labs Lab 06/29/17 0500 06/30/17 0530 07/01/17 0211 07/01/17 0409 07/02/17 0232  ALBUMIN 3.5 3.5 3.4* 3.3* 3.6   No results for input(s): LIPASE, AMYLASE in the last 168 hours. No results for input(s): AMMONIA in the last 168 hours.  CBC:  Recent Labs Lab 06/28/17 1051 06/29/17 0500 06/30/17 0530 07/01/17 0210 07/02/17 0231  WBC 11.6* 11.7* 10.9* 10.5 8.3  HGB 7.8* 8.7* 8.5* 7.8* 8.4*  HCT 23.9* 27.2* 26.4* 24.3* 27.1*  MCV 95.2 97.1 96.7 97.2 98.9  PLT 108* 101* 107* 99* 98*  INR:  Recent Labs Lab 06/28/17 1051 06/29/17 0500 06/30/17 0500 07/01/17 0406 07/02/17 0232  INR 1.23 1.19 1.23 1.23 1.23    Other results:  EKG:    Imaging   Dg Chest Port 1v Same Day  Result Date: 06/30/2017 CLINICAL DATA:  71 year old male status post left dialysis catheter placement. EXAM: PORTABLE CHEST 1 VIEW COMPARISON:  06/13/2017 FINDINGS: The left ventricular assist device and right-sided single lead PA CT appearance stable position. The patient is status post median sternotomy and aortic valve replacement. There has been interval placement of AE left subclavian dialysis catheter terminating in the right atrium. The cardiomediastinal silhouette is enlarged but stable. There is mild vascular congestion without frank edema. The lungs are clear. No pleural effusion or pneumothorax. No acute osseous abnormalities. IMPRESSION: 1. Interval placement of a left subclavian dialysis catheter terminating at the right  atrium. 2. Otherwise stable examination of the chest. Electronically Signed   By: Kristopher Oppenheim M.D.   On: 06/30/2017 10:29   Dg Fluoro Guide Cv Line-no Report  Result Date: 06/30/2017 Fluoroscopy was utilized by the requesting physician.  No radiographic interpretation.     Medications:     Scheduled Medications: . atorvastatin  40 mg Oral Daily  . budesonide  0.25 mg Nebulization BID  . busPIRone  5 mg Oral BID  . calcitRIOL  0.5 mcg Oral QODAY  . Chlorhexidine Gluconate Cloth  6 each Topical Daily  . darbepoetin (ARANESP) injection - NON-DIALYSIS  150 mcg Subcutaneous Q Fri-1800  . docusate sodium  100 mg Oral BID  . furosemide  160 mg Oral BID  . gabapentin  300 mg Oral QHS  . heparin  40 Units/kg Dialysis Once in dialysis  . hydrALAZINE  10 mg Oral Q8H  . insulin aspart  0-15 Units Subcutaneous TID WC  . insulin aspart  0-5 Units Subcutaneous QHS  . insulin glargine  17 Units Subcutaneous QHS  . levothyroxine  25 mcg Oral QAC breakfast  . multivitamin  1 tablet Oral QHS  . neomycin-bacitracin-polymyxin   Topical Daily  . pantoprazole  40 mg Oral Q1200  . rOPINIRole  0.25 mg Oral QHS  . sevelamer carbonate  2.4 g Oral TID WC  . sildenafil  40 mg Oral TID  . sodium chloride flush  10-40 mL Intracatheter Q12H  . Warfarin - Pharmacist Dosing Inpatient   Does not apply q1800    Infusions: . sodium chloride    . sodium chloride    . sodium chloride    . heparin 1,450 Units/hr (07/02/17 0905)    PRN Medications: sodium chloride, sodium chloride, acetaminophen, albuterol, alteplase, heparin, heparin, lidocaine (PF), lidocaine-prilocaine, ondansetron (ZOFRAN) IV, oxyCODONE, pentafluoroprop-tetrafluoroeth, sodium chloride flush, traMADol, traZODone   Patient Profile   Trevor Hockett Murphyis a 71 y.o.malewith a history of CAD s/p CABG x 4 2010, OSA, AS with TAVR 2015, CKD, DM2, paroxysmal atrial fibrillation, asthma, and ischemic cardiomyopathy with Medtronic ICD.    He admitted with marked volume overload and AKI despite with increase in outpatient diuretics.    Assessment/Plan:    1. Acute on chronic systolic CHF: Ischemic cardiomyopathy, EF 20-25% with RV dysfunction on 2/18 echo pre-LVAD. 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 Medtronic ICD. Overall, he has felt much better post-LVAD. However, he developed AKI and was eventually admitted in 5/18 after RHC showed elevated filling pressures. He was diuresed and creatinine came down. Since then, we have adjusted up and down his torsemide dose, with higher dose volume improves but creatinine  worsens. With lower dose we can make creatinine look better but he gets volume overloaded.  He has again developed worsening volume overload with AKI, creatinine up to 5.79.  He had RHC on 8/10 as above and milrinone 0.125 started, increased to 0.25 on 8/11.  Speed increased to 9600 on 8/10.  Main issue is severe RV failure. Milrinone stopped and CVVH begun. CVVH stopped 8/17.  Now getting iHD and tolerating so far. MAP stable in 80s.  - No ASA with h/o GI bleeding - Restarted warfarin with INR goal 2-2.5, stop heparin gtt when INR > 1.8. Oozing at tunneled catheter site, got 3 stitches to stop bleeding today.  - Volume management via HD. He remains on Lasix per renal, probably can stop this but will defer to renal.  2. CAD: s/p CABG.  No CP.  - Continue atorvastatin. No change.  3. AKI on CKD stage IV: CVVH stopped 8/17. Tolerating HD so far, weight remaining stable.  Now has had fistula placed and tunneled catheter.    - Continue iHD, outpatient planning has begun. Will have LVAD nurse coordinators educate his outpatient site [facility]).        4. Atrial fibrillation: Chronic. On heparin gtt, warfarin restarted.    5. RV failure: Continue Revatio 40 mg TID.  Now off milrinone.  6. HTN: Stable. Off amlodipine. Remains on hydralazine 10 mg every 8 hours, will need to hold on HD days.    7. Anemia: Baseline anemia of renal  disease/chronic disease but recent bleeding from colonic AVMs. No overt bleeding, may be primarily anemia of renal disease at this point. FOBT negative.  8. 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 - No overt bleeding. Suspect related to anemia of renal disease. FOBT negative this admission.  - Continue octreotide injections. - No ASA, warfarin INR goal 2-2.5.  9. Skin cancer: On neck and RLE. Needs excision, does not have to stop warfarin. Will address further as outpatient. No change.  WOC assessed biopsy sites  10. Thrombocytopenia: Chronic, mild, stable.  11. Diabetes: Stable.    Home when INR therapeutic and we can stop heparin gtt.  Ambulate on unit today.   I reviewed the LVAD parameters from today, and compared the results to the patient's prior recorded data.  No programming changes were made.  The LVAD is functioning within specified parameters.  The patient performs LVAD self-test daily.  LVAD interrogation was negative for any significant power changes, alarms or PI events/speed drops.  LVAD equipment check completed and is in good working order.  Back-up equipment present.   LVAD education done on emergency procedures and precautions and reviewed exit site care.  Length of Stay: [place], MD 07/02/2017, 9:32 AM  VAD Team --- VAD ISSUES ONLY--- Pager [phone] (7am - 7am)  Advanced Heart Failure Team  Pager [phone] (M-F; 7a - 4p)  Please contact [facility] for night-coverage after hours (4p -7a ) and weekends on [email or web address]

## 2017-07-02 NOTE — Progress Notes (Signed)
Received page from bedside RN stating patient had been bleeding from tunneled HD catheter site. Stated she had changed the dressing x4 and had notified cards fellow. Stated the patient has become frustrated and asked her to page VAD pager. Instructed RN to call [facility]VS on call to assess site. Will inform Dr. Prescott Gum and [name].

## 2017-07-02 NOTE — Progress Notes (Signed)
  Progress Note  Checked on the patient just now in HD. No further oozing at this time. Dr. Prescott Gum had placed 3 stitches earlier this am at the bedside. Continues to have pain at left fistula site. Fistula is patent. Continues to have some mild swelling and erythema. Does not look infected. No hematoma present.    Virgina Jock, PA-C [facility] Office: [phone] Pager: [phone] 07/02/2017 11:27 AM

## 2017-07-02 NOTE — Progress Notes (Signed)
CRITICAL VALUE ALERT  Critical Value: aPTT 181  Date & Time Notied:  07/02/17 0458  Provider Notified: Elson Areas On call  Orders Received/Actions taken: No new orders received at this time, will continue to monitor.

## 2017-07-03 ENCOUNTER — Telehealth: Payer: Self-pay | Admitting: Vascular Surgery

## 2017-07-03 LAB — CBC
HEMATOCRIT: 25.5 % — AB (ref 39.0–52.0)
Hemoglobin: 8.1 g/dL — ABNORMAL LOW (ref 13.0–17.0)
MCH: 31.5 pg (ref 26.0–34.0)
MCHC: 31.8 g/dL (ref 30.0–36.0)
MCV: 99.2 fL (ref 78.0–100.0)
PLATELETS: 94 10*3/uL — AB (ref 150–400)
RBC: 2.57 MIL/uL — ABNORMAL LOW (ref 4.22–5.81)
RDW: 18.2 % — AB (ref 11.5–15.5)
WBC: 7.5 10*3/uL (ref 4.0–10.5)

## 2017-07-03 LAB — GLUCOSE, CAPILLARY
GLUCOSE-CAPILLARY: 113 mg/dL — AB (ref 65–99)
Glucose-Capillary: 124 mg/dL — ABNORMAL HIGH (ref 65–99)
Glucose-Capillary: 210 mg/dL — ABNORMAL HIGH (ref 65–99)
Glucose-Capillary: 153 mg/dL — ABNORMAL HIGH (ref 65–99)

## 2017-07-03 LAB — RENAL FUNCTION PANEL
ALBUMIN: 3.3 g/dL — AB (ref 3.5–5.0)
ANION GAP: 9 (ref 5–15)
BUN: 19 mg/dL (ref 6–20)
CALCIUM: 8.8 mg/dL — AB (ref 8.9–10.3)
CO2: 27 mmol/L (ref 22–32)
CREATININE: 2.98 mg/dL — AB (ref 0.61–1.24)
Chloride: 97 mmol/L — ABNORMAL LOW (ref 101–111)
GFR calc Af Amer: 23 mL/min — ABNORMAL LOW (ref >60)
GFR calc non Af Amer: 20 mL/min — ABNORMAL LOW (ref >60)
GLUCOSE: 139 mg/dL — AB (ref 65–99)
PHOSPHORUS: 3.8 mg/dL (ref 2.5–4.6)
Potassium: 4 mmol/L (ref 3.5–5.1)
SODIUM: 133 mmol/L — AB (ref 135–145)

## 2017-07-03 LAB — PROTIME-INR
INR: 1.21
PROTHROMBIN TIME: 15.2 s (ref 11.4–15.2)

## 2017-07-03 LAB — HEPARIN LEVEL (UNFRACTIONATED): HEPARIN UNFRACTIONATED: 0.33 [IU]/mL (ref 0.30–0.70)

## 2017-07-03 LAB — LACTATE DEHYDROGENASE: LDH: 205 U/L — ABNORMAL HIGH (ref 98–192)

## 2017-07-03 MED ORDER — WARFARIN SODIUM 2.5 MG PO TABS
12.5000 mg | ORAL_TABLET | Freq: Once | ORAL | Status: AC
  Administered 2017-07-03: 18:00:00 12.5 mg via ORAL
  Filled 2017-07-03: qty 1

## 2017-07-03 MED ORDER — FENTANYL CITRATE (PF) 100 MCG/2ML IJ SOLN
12.5000 ug | Freq: Once | INTRAMUSCULAR | Status: AC
  Administered 2017-07-03: 12.5 ug via INTRAVENOUS
  Filled 2017-07-03: qty 2

## 2017-07-03 NOTE — Progress Notes (Signed)
ANTICOAGULATION CONSULT NOTE - Follow Up Consult  Pharmacy Consult for Heparin and Coumadin Indication: LVAD  No Known Allergies  Patient Measurements: Height: 5\' 5"  (165.1 cm) Weight: 180 lb 12.4 oz (82 kg) IBW/kg (Calculated) : 61.5 Heparin Dosing Weight: 80.7kg  Vital Signs: Temp: 97.5 F (36.4 C) (08/30 1153) Temp Source: Oral (08/30 1153) BP: 107/96 (08/30 1153)  Labs:  Recent Labs  07/01/17 0210  07/01/17 0406 07/01/17 0409 07/02/17 0231 07/02/17 0232 07/03/17 0453  HGB 7.8*  --   --   --  8.4*  --  8.1*  HCT 24.3*  --   --   --  27.1*  --  25.5*  PLT 99*  --   --   --  98*  --  94*  APTT  --   --   --   --  181*  --   --   LABPROT  --   --  15.6*  --   --  15.4* 15.2  INR  --   --  1.23  --   --  1.23 1.21  HEPARINUNFRC  --   --   --  0.30 0.35  --  0.33  CREATININE  --   < >  --  2.71*  --  4.06* 2.98*  < > = values in this interval not displayed.  Estimated Creatinine Clearance: 22.4 mL/min (A) (by C-G formula based on SCr of 2.98 mg/dL (H)).   Medications: Heparin @ 1450 units/hr  Assessment: 70yom with LVAD resumed on heparin and coumadin 8/27 after left AV fistula and right TDC placement. Heparin level is therapeutic at 0.33. INR below goal 1.23 and not moving - unsure why,no drug interactions identified and patient says he is not eating salads/greens. Hgb/platelets low but stable. LDH ok. No further bleeding at [facility] site, however, hematoma noted at fistula site.  PTA dose: 5mg  daily except 7.5mg  on Tuesday  Goal of Therapy:  INR 2-2.5 Heparin level 0.3-0.5 units/ml Monitor platelets by anticoagulation protocol: Yes   Plan:  1) Continue heparin at 1450 units/hr 2) Increase coumadin to 12.5mg  tonight 3) Daily heparin level, INR, CBC  Deboraha Sprang 07/03/2017,11:59 AM

## 2017-07-03 NOTE — Progress Notes (Signed)
LVAD Coordinator Rounding Note:  Admitted 06/13/17 due to RV failure, marked volume overload.   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 Darcey Nora under Destination Therapy criteria.   Left brachiocephalic AV fistula placed 06/30/17. Right IJ tunneled dialysis catheter placed 06/30/17.  Vital signs: HR: 68AFib Doppler Pressure: 82 Automatic BP: 96/77  O2 Sat: 100% on RA Wt in lbs: [phone]  LVAD interrogation reveals:  Speed:9600 Flow: 4.9 Power: 6 w PI: 5.4 Alarms: none Events: 8/29- 8 PI events, 8/30- 2 PI events Fixed speed: 9600 Low speed limit: 9000  Drive Line: Sorbaview dressing dry and intact; needs weekly dressing changes. Next due 07/07/17  Labs:  LDH trend: 317>270>295>255>271>293>304>297>260>272>251>235>223>205>222>205  INR trend: 2.15>2.3>2.04>1.94>1.79>1.66>1.95>1.72>1.43>1.27>1.23>1.21>1.23>1.19>1.23>1.23>1.23>1.21   Blood Transfusion: 06/14/17 - 2 units PRBCs 06/22/17- 1 unit PRBC 07/02/17- 1 unit PRBC  Renal: -temporary dialysis catheter placed 06/17/17 -CVVH initiated 06/17/17 stopped 8/18 -HD started 06/23/17, planning for m,w,f   Gtts: Heparin   Anticoagulation Plan: -INR Goal: 2.0 - 2.5 - warfarin restarted 8/27 -ASA Dose: 81 mg daily - on hold  Device: -Medtronic single lead -Therapies: on   Plan/Recommendations:   1. Plan to arrange outpatient HD. Education will begin at HD center 8/30. Will plan to attend HD treatment with patient for the first few treatments. 2. Please page VAD coordinator for equipment issues or patient concerns 3. Dressing due to be changed Monday 07/07/2017   Balinda Quails RN, VAD Coordinator 24/7 pager [phone]

## 2017-07-03 NOTE — Telephone Encounter (Signed)
-----   Message from Ovidio Hanger sent at 07/01/2017  9:02 AM EDT ----- Regarding: BLC appt 4-6 weeks   ----- Message ----- From: Ansel Bong Sent: 07/01/2017   8:48 AM To: Vvs Charge Pool  S/p left brachial-cephalic AV fistula 11/30/49  F/u with Dr. Bridgett Larsson in 4-6 weeks. No duplex.  Thanks Maudie Mercury

## 2017-07-03 NOTE — Progress Notes (Signed)
Patient ID: Trevor [name], male   DOB: 1946/11/02, 71 y.o.   MRN: [id number] [facility] Progress Note   Assessment/ Plan:   1. Acute on chronic systolic congestive heart failure secondary to ischemic cardiomyopathy: Status post LVAD with symptomatic improvement and now on chronic hemodialysis. Awaiting on therapeutic INR prior to consideration of discharge. 2.ESRD: Continue hemodialysis on Monday/Wednesday/Friday schedule, he will continue dialysis at [facility] upon discharge (LVAD representative apparently to provide them with some training). Will order for hemodialysis again tomorrow. No acute indications noted at this time. 3. Anemia: Hemoglobin stable at this time, continue to monitor for overt blood loss 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 Nutrition: Continue nutritional supplementation with renal multivitamin/protein supplements 6. Hypertension: Blood pressures on the lower side but the patient remains asymptomatic-continue to monitor with cautious ultrafiltration and hemodialysis. 7. Hyponatremia: Secondary to ESRD/free water excretion defect-will need to continue fluid restriction <1.2 L/day  Subjective:   Noted to have had some pain and swelling over left BCF site-seen by Dr. Trula Slade and found to be consistent with postoperative hematoma.    Objective:   BP 96/77 (BP Location: Right Arm)   Pulse 70   Temp 98.3 F (36.8 C) (Oral)   Resp 15   Ht 5\' 5"  (1.651 m)   Wt 82 kg (180 lb 12.4 oz)   SpO2 100%   BMI 30.08 kg/m   Physical Exam: ELF:[id number] resting in bed CVS: Pulse regular rhythm, normal rate, S1 and S2 normal Resp: Anteriorly clear to auscultation, no rales or rhonchi Abd: Soft, obese, nontender, LVAD tubes noted RUQ Ext: Trace ankle edema, intact dressing right pretibial. Left IJ TDC dressing dry/clean. Left BCF very difficult to palpate with very faint  bruit-some associated swelling noted.  Labs: BMET  Recent Labs Lab 06/28/17 0416 06/29/17 0500 06/30/17 0530 07/01/17 0211 07/01/17 0409 07/02/17 0232 07/03/17 0453  NA 131* 134* 133* 133* 136 133* 133*  K 3.6 3.8 3.8 4.0 4.0 4.1 4.0  CL 95* 96* 95* 95* 100* 98* 97*  CO2 28 28 26 26 28 26 27   GLUCOSE 217* 80 101* 118* 92 188* 139*  BUN 18 33* 46* 56* 20 29* 19  CREATININE 3.15* 4.69* 5.72* 6.48* 2.71* 4.06* 2.98*  CALCIUM 8.2* 8.9 8.9 8.6* 8.1* 8.7* 8.8*  PHOS 2.9 4.1 4.2 5.0* 2.0* 3.9 3.8   CBC  Recent Labs Lab 06/30/17 0530 07/01/17 0210 07/02/17 0231 07/03/17 0453  WBC 10.9* 10.5 8.3 7.5  HGB 8.5* 7.8* 8.4* 8.1*  HCT 26.4* 24.3* 27.1* 25.5*  MCV 96.7 97.2 98.9 99.2  PLT 107* 99* 98* 94*   Medications:    . atorvastatin  40 mg Oral Daily  . budesonide  0.25 mg Nebulization BID  . busPIRone  5 mg Oral BID  . calcitRIOL  0.5 mcg Oral QODAY  . Chlorhexidine Gluconate Cloth  6 each Topical Daily  . darbepoetin (ARANESP) injection - NON-DIALYSIS  150 mcg Subcutaneous Q Fri-1800  . docusate sodium  100 mg Oral BID  . furosemide  160 mg Oral BID  . gabapentin  300 mg Oral QHS  . hydrALAZINE  10 mg Oral Q8H  . insulin aspart  0-15 Units Subcutaneous TID WC  . insulin aspart  0-5 Units Subcutaneous QHS  . insulin glargine  17 Units Subcutaneous QHS  . levothyroxine  25 mcg Oral QAC breakfast  . multivitamin  1 tablet Oral QHS  . neomycin-bacitracin-polymyxin  Topical Daily  . pantoprazole  40 mg Oral Q1200  . rOPINIRole  0.25 mg Oral QHS  . sevelamer carbonate  2.4 g Oral TID WC  . sildenafil  40 mg Oral TID  . sodium chloride flush  10-40 mL Intracatheter Q12H  . Warfarin - Pharmacist Dosing Inpatient   Does not apply [id number]   Elmarie Shiley, MD 07/03/2017, 8:12 AM

## 2017-07-03 NOTE — Progress Notes (Signed)
   Daily Progress Note   POD #3 s/p L TDC exchange, L BC AVF   No complaints at this point  Bleeding from [facility] site stopped after stitches by Dr. Prescott Gum  Follow up in office in 6 weeks   Adele Barthel, MD, [facility] [place] Office: [phone] Pager: [phone]  07/03/2017, 9:25 AM

## 2017-07-03 NOTE — Care Management Note (Signed)
Case Management Note Previous Note Completed by Irven Shelling RN, BSN Unit 4E-Case Manager-- [place] coverage [phone]  Patient Details  Name: Trevor [name] MRN: [id number] Date of Birth: 1946-09-23  Subjective/Objective:  Pt admitted with Acute on chronic systolic CHF -- placed on IV diuresis  - hx of LVAD placement February 2018              Action/Plan: PTA pt lived at home- followed by HF clinic- CM to follow   Expected Discharge Date:                  Expected Discharge Plan:  [place]  In-House Referral:     Discharge planning Services  CM Consult  Post Acute Care Choice:    Choice offered to:     DME Arranged:    DME Agency:     HH Arranged:    [place] Agency:     Status of Service:  In process, will continue to follow  If discussed at Long Length of Stay Meetings, dates discussed:    Discharge Disposition:   Additional Comments: 07/03/2017  Pt independently ambulating around unit.  Pt will continue to stay with sister at discharge.  CM spoke with Kennyth Lose CSW for HF team, liason and team will continue to follow pt closely at discharge.  CM will continue to follow for discharge.  NO CM needs anticipated at this time  07/01/17 Pt successfully clipped at [facility].  Pt is now 1 day  s/p AV fistula and TDC placement.  Pt INR remains below goal at 1.23 - on continuous Heparin   06/30/17 Pt transferred to [place] 8/23.  Pt is ESRD and will likely need long term HD.  Plan is for vein mapping  [name], RN 07/03/2017, 3:30 PM

## 2017-07-03 NOTE — Progress Notes (Signed)
Patient ID: Trevor [name], male   DOB: [birth year], 71 y.o.   MRN: [id number]    Advanced Heart Failure VAD Team Note  Subjective:    Started on CVVHD 06/17/17 via temporary cath.   CVVHD stopped 8/17.   Tunneled HD catheter and AV fistula placed 8/27. Warfarin restarted.   Tolerating iHD so far. PI events noted during HD. Outpatient HD being arranged.   Feels good today. Having some pain around fistula site.   INR down at 1.21. On IV heparin gtt until INR 1.8.   Studies: RHC (06/13/17):  RA = 17 RV = 58/18 PA = 56/25 (37) PCW = 20 (v = 35) Fick cardiac output/index = 6.4/3.2 Thermo CO/CI = 4.1/2.1 PVR = 2.6 Ao sat = 98% PA sat = 56%, 60% Increased LVAD speed to 9600   PCW down to 19  LVAD INTERROGATION:  HeartMate II LVAD:  Flow 6.6 liters/min, speed 9600, power 7.0, PI 3.0  Objective:    Vital Signs:   Temp:  [97.7 F (36.5 C)-98.8 F (37.1 C)] 98.3 F (36.8 C) (08/30 0745) Pulse Rate:  [62-76] 70 (08/29 1347) Resp:  [10-21] 15 (08/30 0745) BP: (77-123)/(29-99) 96/77 (08/30 0745) SpO2:  [95 %-100 %] 100 % (08/30 0900) Weight:  [180 lb 1.9 oz (81.7 kg)-180 lb 12.4 oz (82 kg)] 180 lb 12.4 oz (82 kg) (08/30 0457) Last BM Date: 07/02/17 Mean arterial Pressure 87   Intake/Output:   Intake/Output Summary (Last 24 hours) at 07/03/17 0945 Last data filed at 07/03/17 0911  Gross per 24 hour  Intake          1194.97 ml  Output             2875 ml  Net         -1680.03 ml     Physical Exam    Physical Exam: GENERAL: Elderly appearing. NAD.  HEENT: Normal. NECK: Supple, JVP 7-8 cm. Carotids OK.  CARDIAC:  Mechanical heart sounds with LVAD hum present.  LUNGS:  CTAB, normal effort.  ABDOMEN:  NT, ND, no HSM. No bruits or masses. +BS  LVAD exit site: Well-healed and incorporated. Dressing dry and intact. No erythema or drainage. Stabilization device present and accurately applied. Driveline dressing changed daily per sterile technique. EXTREMITIES:  Warm and  dry. No cyanosis, clubbing, rash, or edema.  NEUROLOGIC:  Alert & oriented x 3. Cranial nerves grossly intact. Moves all 4 extremities w/o difficulty. Affect pleasant       Telemetry   Personally reviewed, Afib 70-80s   Labs   Basic Metabolic Panel:  Recent Labs Lab 06/30/17 0530 07/01/17 0211 07/01/17 0409 07/02/17 0232 07/03/17 0453  NA 133* 133* 136 133* 133*  K 3.8 4.0 4.0 4.1 4.0  CL 95* 95* 100* 98* 97*  CO2 26 26 28 26 27   GLUCOSE 101* 118* 92 188* 139*  BUN 46* 56* 20 29* 19  CREATININE 5.72* 6.48* 2.71* 4.06* 2.98*  CALCIUM 8.9 8.6* 8.1* 8.7* 8.8*  PHOS 4.2 5.0* 2.0* 3.9 3.8    Liver Function Tests:  Recent Labs Lab 06/30/17 0530 07/01/17 0211 07/01/17 0409 07/02/17 0232 07/03/17 0453  ALBUMIN 3.5 3.4* 3.3* 3.6 3.3*   No results for input(s): LIPASE, AMYLASE in the last 168 hours. No results for input(s): AMMONIA in the last 168 hours.  CBC:  Recent Labs Lab 06/29/17 0500 06/30/17 0530 07/01/17 0210 07/02/17 0231 07/03/17 0453  WBC 11.7* 10.9* 10.5 8.3 7.5  HGB 8.7* 8.5* 7.8* 8.4* 8.1*  HCT 27.2* 26.4* 24.3* 27.1* 25.5*  MCV 97.1 96.7 97.2 98.9 99.2  PLT 101* 107* 99* 98* 94*    INR:  Recent Labs Lab 06/29/17 0500 06/30/17 0500 07/01/17 0406 07/02/17 0232 07/03/17 0453  INR 1.19 1.23 1.23 1.23 1.21    Other results:  EKG:    Imaging   No results found.  Medications:     Scheduled Medications: . atorvastatin  40 mg Oral Daily  . budesonide  0.25 mg Nebulization BID  . busPIRone  5 mg Oral BID  . calcitRIOL  0.5 mcg Oral QODAY  . Chlorhexidine Gluconate Cloth  6 each Topical Daily  . darbepoetin (ARANESP) injection - NON-DIALYSIS  150 mcg Subcutaneous Q Fri-1800  . docusate sodium  100 mg Oral BID  . fentaNYL (SUBLIMAZE) injection  12.5 mcg Intravenous Once  . furosemide  160 mg Oral BID  . gabapentin  300 mg Oral QHS  . hydrALAZINE  10 mg Oral Q8H  . insulin aspart  0-15 Units Subcutaneous TID WC  . insulin  aspart  0-5 Units Subcutaneous QHS  . insulin glargine  17 Units Subcutaneous QHS  . levothyroxine  25 mcg Oral QAC breakfast  . multivitamin  1 tablet Oral QHS  . neomycin-bacitracin-polymyxin   Topical Daily  . pantoprazole  40 mg Oral Q1200  . rOPINIRole  0.25 mg Oral QHS  . sevelamer carbonate  2.4 g Oral TID WC  . sildenafil  40 mg Oral TID  . sodium chloride flush  10-40 mL Intracatheter Q12H  . Warfarin - Pharmacist Dosing Inpatient   Does not apply q1800    Infusions: . sodium chloride    . sodium chloride    . sodium chloride    . heparin 1,450 Units/hr (07/02/17 1609)    PRN Medications: sodium chloride, sodium chloride, acetaminophen, albuterol, alteplase, heparin, heparin, lidocaine (PF), lidocaine-prilocaine, ondansetron (ZOFRAN) IV, oxyCODONE, pentafluoroprop-tetrafluoroeth, sodium chloride flush, traMADol, traZODone   Patient Profile   Trevor Cavanah Murphyis a 71 y.o.malewith a history of CAD s/p CABG x 4 2010, OSA, AS with TAVR 2015, CKD, DM2, paroxysmal atrial fibrillation, asthma, and ischemic cardiomyopathy with Medtronic ICD.   He admitted with marked volume overload and AKI despite with increase in outpatient diuretics.    Assessment/Plan:    1. Acute on chronic systolic CHF: Ischemic cardiomyopathy, EF 20-25% with RV dysfunction on 2/18 echo pre-LVAD. 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 Medtronic ICD. Overall, he has felt much better post-LVAD. However, he developed AKI and was eventually admitted in 5/18 after RHC showed elevated filling pressures. He was diuresed and creatinine came down. Since then, we have adjusted up and down his torsemide dose, with higher dose volume improves but creatinine worsens. With lower dose we can make creatinine look better but he gets volume overloaded.  He has again developed worsening volume overload with AKI, creatinine up to 5.79.  He had RHC on 8/10 as above and milrinone 0.125 started, increased to 0.25 on 8/11.  Speed  increased to 9600 on 8/10.  Main issue is severe RV failure. Milrinone stopped and CVVH begun. CVVH stopped 8/17.  Now getting iHD and tolerating so far. MAP stable in 80s.  - No ASA with h/o GI bleeding - Restarted warfarin with INR goal 2-2.5, stop heparin gtt when INR > 1.8. Discussed dosing with pharm.  - Volume management via HD. He remains on Lasix per renal, probably can stop this but will defer to renal.  2. CAD: s/p CABG.  No CP.  -  Continue atorvastatin. No change.  3. AKI on CKD stage IV: CVVH stopped 8/17. Tolerating HD so far, weight remaining stable.  Now has had fistula placed and tunneled catheter.    - Continue iHD, outpatient planning has begun. Will have LVAD nurse coordinators educate his outpatient site [facility]).  - Continues to tolerate well.  4. Atrial fibrillation: Chronic. On heparin gtt, warfarin restarted.    5. RV failure: Continue Revatio 40 mg TID.  Now off milrinone.  6. HTN: Stable. Off amlodipine. - Continue hydral 10 mg TID. Hold on HD days.    7. Anemia: Baseline anemia of renal disease/chronic disease but recent bleeding from colonic AVMs. No overt bleeding, may be primarily anemia of renal disease at this point. FOBT negative.  8. 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 - No overt bleeding. Suspect related to anemia of renal disease. FOBT negative this admission.  - Continue octreotide injections. - No ASA, warfarin INR goal 2-2.5. No change.  9. Skin cancer: On neck and RLE. Needs excision, does not have to stop warfarin. Will address further as outpatient.  WOC assessed biopsy sites  - No change.  10. Thrombocytopenia: Chronic, mild, stable. No change. 11. Diabetes:  - Stable. No change.   Home when INR therapeutic and we can stop heparin gtt.  Continue ambulating on unit.   I reviewed the LVAD parameters from today, and compared the results to the patient's prior recorded data.  No programming changes were made.   The LVAD is functioning within specified parameters.  The patient performs LVAD self-test daily.  LVAD interrogation was negative for any significant power changes, alarms or PI events/speed drops.  LVAD equipment check completed and is in good working order.  Back-up equipment present.   LVAD education done on emergency procedures and precautions and reviewed exit site care.   Length of Stay: [address]  Annamaria Helling 07/03/2017, 9:45 AM  VAD Team --- VAD ISSUES ONLY--- Pager [phone] (7am - 7am)  Advanced Heart Failure Team  Pager [phone] (M-F; 7a - 4p)  Please contact [facility] for night-coverage after hours (4p -7a ) and weekends on [email or web address]   Patient seen with PA, agree with the above note.  Stable today, INR still low and remains on heparin gtt.  Will keep until INR > 1.8 and we can stop heparin.   Tolerating HD, plans being made for outpatient HD.   Loralie Champagne 07/03/2017 1:25 PM

## 2017-07-03 NOTE — Progress Notes (Signed)
37 Talked with pt's RN. He has been walking independently this morning and is sleeping now. Will continue to follow. Graylon Good RN BSN 07/03/2017 11:42 AM

## 2017-07-03 NOTE — Telephone Encounter (Signed)
Sched appt 08/08/17 at 1:00. Lm on cell#.

## 2017-07-04 ENCOUNTER — Encounter (HOSPITAL_COMMUNITY): Payer: Medicare Other

## 2017-07-04 LAB — RENAL FUNCTION PANEL
ALBUMIN: 3.4 g/dL — AB (ref 3.5–5.0)
ANION GAP: 11 (ref 5–15)
BUN: 31 mg/dL — ABNORMAL HIGH (ref 6–20)
CALCIUM: 9.1 mg/dL (ref 8.9–10.3)
CO2: 29 mmol/L (ref 22–32)
CREATININE: 4.13 mg/dL — AB (ref 0.61–1.24)
Chloride: 92 mmol/L — ABNORMAL LOW (ref 101–111)
GFR calc Af Amer: 15 mL/min — ABNORMAL LOW (ref >60)
GFR calc non Af Amer: 13 mL/min — ABNORMAL LOW (ref >60)
GLUCOSE: 163 mg/dL — AB (ref 65–99)
PHOSPHORUS: 4.6 mg/dL (ref 2.5–4.6)
Potassium: 4.1 mmol/L (ref 3.5–5.1)
SODIUM: 132 mmol/L — AB (ref 135–145)

## 2017-07-04 LAB — CBC
HCT: 25.6 % — ABNORMAL LOW (ref 39.0–52.0)
HEMOGLOBIN: 8.2 g/dL — AB (ref 13.0–17.0)
MCH: 30.9 pg (ref 26.0–34.0)
MCHC: 32 g/dL (ref 30.0–36.0)
MCV: 96.6 fL (ref 78.0–100.0)
Platelets: 113 10*3/uL — ABNORMAL LOW (ref 150–400)
RBC: 2.65 MIL/uL — AB (ref 4.22–5.81)
RDW: 17.5 % — ABNORMAL HIGH (ref 11.5–15.5)
WBC: 8 10*3/uL (ref 4.0–10.5)

## 2017-07-04 LAB — GLUCOSE, CAPILLARY
GLUCOSE-CAPILLARY: 84 mg/dL (ref 65–99)
GLUCOSE-CAPILLARY: 217 mg/dL — AB (ref 65–99)
Glucose-Capillary: 150 mg/dL — ABNORMAL HIGH (ref 65–99)
Glucose-Capillary: 147 mg/dL — ABNORMAL HIGH (ref 65–99)

## 2017-07-04 LAB — PROTIME-INR
INR: 1.26
Prothrombin Time: 15.7 seconds — ABNORMAL HIGH (ref 11.4–15.2)

## 2017-07-04 LAB — LACTATE DEHYDROGENASE: LDH: 193 U/L — ABNORMAL HIGH (ref 98–192)

## 2017-07-04 LAB — HEPARIN LEVEL (UNFRACTIONATED): Heparin Unfractionated: 0.38 IU/mL (ref 0.30–0.70)

## 2017-07-04 LAB — MAGNESIUM: MAGNESIUM: 1.6 mg/dL — AB (ref 1.7–2.4)

## 2017-07-04 MED ORDER — DARBEPOETIN ALFA 150 MCG/0.3ML IJ SOSY
PREFILLED_SYRINGE | INTRAMUSCULAR | Status: AC
  Administered 2017-07-04: 150 ug via SUBCUTANEOUS
  Filled 2017-07-04: qty 0.3

## 2017-07-04 MED ORDER — WARFARIN SODIUM 7.5 MG PO TABS
15.0000 mg | ORAL_TABLET | Freq: Once | ORAL | Status: AC
  Administered 2017-07-04: 15 mg via ORAL
  Filled 2017-07-04 (×2): qty 2

## 2017-07-04 MED ORDER — OXYCODONE HCL 5 MG PO TABS
ORAL_TABLET | ORAL | Status: AC
  Filled 2017-07-04: qty 2

## 2017-07-04 MED ORDER — HYDRALAZINE HCL 20 MG/ML IJ SOLN
INTRAMUSCULAR | Status: AC
  Administered 2017-07-04: 10 mg via INTRAVENOUS
  Filled 2017-07-04: qty 1

## 2017-07-04 MED ORDER — HYDRALAZINE HCL 20 MG/ML IJ SOLN
10.0000 mg | Freq: Once | INTRAMUSCULAR | Status: AC
  Administered 2017-07-04: 10 mg via INTRAVENOUS

## 2017-07-04 MED ORDER — TRAMADOL HCL 50 MG PO TABS
ORAL_TABLET | ORAL | Status: AC
  Administered 2017-07-04: 50 mg via ORAL
  Filled 2017-07-04: qty 1

## 2017-07-04 NOTE — Progress Notes (Signed)
Patient ID: Trevor [name], male   DOB: [birth year], 71 y.o.   MRN: [id number]    Advanced Heart Failure VAD Team Note  Subjective:    Started on CVVHD 06/17/17 via temporary cath.   CVVHD stopped 8/17.   Tunneled HD catheter and AV fistula placed 8/27. Warfarin restarted.   Complaining of LUE pain from hematoma around fistula. Denies SOB.   INR down at 1.21. On IV heparin gtt until INR 1.8.   Studies: RHC (06/13/17):  RA = 17 RV = 58/18 PA = 56/25 (37) PCW = 20 (v = 35) Fick cardiac output/index = 6.4/3.2 Thermo CO/CI = 4.1/2.1 PVR = 2.6 Ao sat = 98% PA sat = 56%, 60% Increased LVAD speed to 9600   PCW down to 19  LVAD INTERROGATION:  HeartMate II LVAD:  Flow 4.9  liters/min, speed 9600 power 6.1, PI 4.3   Objective:    Vital Signs:   Temp:  [97.5 F (36.4 C)-98.5 F (36.9 C)] 97.7 F (36.5 C) (08/31 0740) Pulse Rate:  [68-81] 77 (08/31 0740) Resp:  [11-19] 16 (08/31 0740) BP: (92-120)/(55-99) 92/55 (08/31 0740) SpO2:  [95 %-100 %] 98 % (08/30 2330) Weight:  [180 lb 14.4 oz (82.1 kg)-181 lb (82.1 kg)] 181 lb (82.1 kg) (08/31 0740) Last BM Date: 07/02/17 Mean arterial Pressure 87   Intake/Output:   Intake/Output Summary (Last 24 hours) at 07/04/17 0850 Last data filed at 07/04/17 0400  Gross per 24 hour  Intake             1188 ml  Output             1450 ml  Net             -262 ml     Physical Exam    Physical Exam: GENERAL: Elderly. Chronically ill in dialysis. HEENT: normal  NECK: Supple, JVP ~10  .  2+ bilaterally, no bruits.  No lymphadenopathy or thyromegaly appreciated.   CARDIAC:  Irregular Mechanical heart sounds with LVAD hum present.  LUNGS: coarse throughout.   ABDOMEN:  Soft, round, nontender, positive bowel sounds x4.     LVAD exit site: well-healed and incorporated.  Dressing dry and intact.  No erythema or drainage.  Stabilization device present and accurately applied.  Driveline dressing is being changed daily per sterile  technique. EXTREMITIES:  Warm and dry, no cyanosis, clubbing, rash. LUE fistular tender to touch  NEUROLOGIC:  Alert and oriented x 4.  Gait steady.  No aphasia.  No dysarthria.  Affect pleasant.      Telemetry   Personally reviewed A fib NSVT 90-120s   Labs   Basic Metabolic Panel:  Recent Labs Lab 07/01/17 0211 07/01/17 0409 07/02/17 0232 07/03/17 0453 07/04/17 0148  NA 133* 136 133* 133* 132*  K 4.0 4.0 4.1 4.0 4.1  CL 95* 100* 98* 97* 92*  CO2 26 28 26 27 29   GLUCOSE 118* 92 188* 139* 163*  BUN 56* 20 29* 19 31*  CREATININE 6.48* 2.71* 4.06* 2.98* 4.13*  CALCIUM 8.6* 8.1* 8.7* 8.8* 9.1  PHOS 5.0* 2.0* 3.9 3.8 4.6    Liver Function Tests:  Recent Labs Lab 07/01/17 0211 07/01/17 0409 07/02/17 0232 07/03/17 0453 07/04/17 0148  ALBUMIN 3.4* 3.3* 3.6 3.3* 3.4*   No results for input(s): LIPASE, AMYLASE in the last 168 hours. No results for input(s): AMMONIA in the last 168 hours.  CBC:  Recent Labs Lab 06/30/17 0530 07/01/17 0210 07/02/17 0231 07/03/17 0453 07/04/17  0148  WBC 10.9* 10.5 8.3 7.5 8.0  HGB 8.5* 7.8* 8.4* 8.1* 8.2*  HCT 26.4* 24.3* 27.1* 25.5* 25.6*  MCV 96.7 97.2 98.9 99.2 96.6  PLT 107* 99* 98* 94* 113*    INR:  Recent Labs Lab 06/30/17 0500 07/01/17 0406 07/02/17 0232 07/03/17 0453 07/04/17 0148  INR 1.23 1.23 1.23 1.21 1.26    Other results:  EKG:    Imaging   No results found.  Medications:     Scheduled Medications: . atorvastatin  40 mg Oral Daily  . budesonide  0.25 mg Nebulization BID  . busPIRone  5 mg Oral BID  . calcitRIOL  0.5 mcg Oral QODAY  . Chlorhexidine Gluconate Cloth  6 each Topical Daily  . darbepoetin (ARANESP) injection - NON-DIALYSIS  150 mcg Subcutaneous Q Fri-1800  . docusate sodium  100 mg Oral BID  . furosemide  160 mg Oral BID  . gabapentin  300 mg Oral QHS  . hydrALAZINE  10 mg Oral Q8H  . insulin aspart  0-15 Units Subcutaneous TID WC  . insulin aspart  0-5 Units Subcutaneous  QHS  . insulin glargine  17 Units Subcutaneous QHS  . levothyroxine  25 mcg Oral QAC breakfast  . multivitamin  1 tablet Oral QHS  . neomycin-bacitracin-polymyxin   Topical Daily  . pantoprazole  40 mg Oral Q1200  . rOPINIRole  0.25 mg Oral QHS  . sevelamer carbonate  2.4 g Oral TID WC  . sildenafil  40 mg Oral TID  . sodium chloride flush  10-40 mL Intracatheter Q12H  . Warfarin - Pharmacist Dosing Inpatient   Does not apply q1800    Infusions: . sodium chloride    . sodium chloride    . sodium chloride    . heparin 1,450 Units/hr (07/04/17 0309)    PRN Medications: sodium chloride, sodium chloride, acetaminophen, albuterol, alteplase, heparin, heparin, lidocaine (PF), lidocaine-prilocaine, ondansetron (ZOFRAN) IV, oxyCODONE, pentafluoroprop-tetrafluoroeth, sodium chloride flush, traMADol, traZODone   Patient Profile   Trevor Faux Murphyis a 71 y.o.malewith a history of CAD s/p CABG x 4 2010, OSA, AS with TAVR 2015, CKD, DM2, paroxysmal atrial fibrillation, asthma, and ischemic cardiomyopathy with Medtronic ICD.   He admitted with marked volume overload and AKI despite with increase in outpatient diuretics.    Assessment/Plan:    1. Acute on chronic systolic CHF: Ischemic cardiomyopathy, EF 20-25% with RV dysfunction on 2/18 echo pre-LVAD. 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 Medtronic ICD. Overall, he has felt much better post-LVAD. However, he developed AKI and was eventually admitted in 5/18 after RHC showed elevated filling pressures. He was diuresed and creatinine came down. Since then, we have adjusted up and down his torsemide dose, with higher dose volume improves but creatinine worsens. With lower dose we can make creatinine look better but he gets volume overloaded.  He has again developed worsening volume overload with AKI, creatinine up to 5.79.  He had RHC on 8/10 as above and milrinone 0.125 started, increased to 0.25 on 8/11.  Speed increased to 9600 on 8/10.  Main  issue is severe RV failure. Milrinone stopped and CVVH begun. CVVH stopped 8/17.  Now getting iHD and tolerating so far. MAPS 50-60s .  - No ASA with h/o GI bleeding - Restarted warfarin with INR goal 2-2.5, stop heparin gtt when INR > 1.8. Discussed dosing with pharm. INR 1.26  - Volume per iHD.  - Stop Lasix.  2. CAD: s/p CABG.  No CP.  - Continue atorvastatin. No  change.  3. AKI on CKD stage IV: CVVH stopped 8/17. Tolerating HD so far, weight remaining stable.  Now has had fistula placed and tunneled catheter.    - Continue iHD, outpatient planning has begun. Will have LVAD nurse coordinators educate his outpatient site [facility]).  INR 1.26. Continues to tolerate well.  4. Atrial fibrillation: Chronic. On heparin gtt, warfarin restarted.   Inr 1.26.  5. RV failure: Continue Revatio 40 mg TID.  Now off milrinone.  6. HTN: Stable. Off amlodipine.  -Stop hydralazine due to low MAP in 50s.     7. Anemia: Baseline anemia of renal disease/chronic disease but recent bleeding from colonic AVMs. No overt bleeding, may be primarily anemia of renal disease at this point. FOBT negative.  8. 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 - No overt bleeding. Suspect related to anemia of renal disease. FOBT negative this admission.  - Continue octreotide injections. - No ASA, warfarin INR goal 2-2.5. No change. INR 1.26 Discussed with coumadin.  9. Skin cancer: On neck and RLE. Needs excision, does not have to stop warfarin. Will address further as outpatient. WOC assessed biopsy sites  - No change.  10. Thrombocytopenia: Chronic, mild, stable. No change. Platelets 113.   11. Diabetes:  - Stable. No change.   INR 1.2.    I reviewed the LVAD parameters from today, and compared the results to the patient's prior recorded data.  No programming changes were made.  The LVAD is functioning within specified parameters.  The patient performs LVAD self-test daily.  LVAD  interrogation was negative for any significant power changes, alarms or PI events/speed drops.  LVAD equipment check completed and is in good working order.  Back-up equipment present.   LVAD education done on emergency procedures and precautions and reviewed exit site care.  Length of Stay: [place], NP 07/04/2017, 8:50 AM  VAD Team --- VAD ISSUES ONLY--- Pager [phone] (7am - 7am)  Advanced Heart Failure Team  Pager [phone] (M-F; 7a - 4p)  Please contact [facility] for night-coverage after hours (4p -7a ) and weekends on [email or web address]   Patient seen with NP, agree with the above note.    He was hypotensive during HD this morning, think this was likely related to getting a prn dose of IV hydralazine prior to coming down for HD.  Will need to avoid this in the future.    Pain in left arm at fistula site, ?thrombosed.  Will ask VVS to take a look.   INR remains 1.2, need to see INR > 1.8 before we stop heparin gtt and let him go home.   Loralie Champagne 07/04/2017 1:17 PM

## 2017-07-04 NOTE — Progress Notes (Addendum)
Patient ID: Trevor [name], male   DOB: 01-19-1946, 71 y.o.   MRN: [id number] [facility] Progress Note   Assessment/ Plan:   1. Acute on chronic systolic congestive heart failure secondary to ischemic cardiomyopathy: Status post LVAD with symptomatic improvement and now on chronic hemodialysis. Appreciate assistance from LVAD nurse in educating dialysis unit staff as well as plans for physical presence during his initial dialysis treatments. We will need to reassess need for continued furosemide 2.ESRD: Continue hemodialysis on Monday/Wednesday/Friday schedule, he will continue dialysis at [facility] on [facility]. On dialysis at this time without problems. LUA BCF appears thrombosed on exam. 3. Anemia: Hemoglobin stable at this time, continue to monitor for overt blood loss. Continue Aranesp. 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 Nutrition: Continue nutritional supplementation with renal multivitamin/protein supplements 6. Hypertension: Blood pressures on the lower side but the patient remains asymptomatic-continue to monitor with cautious ultrafiltration and hemodialysis. 7. Hyponatremia: Secondary to ESRD/free water excretion defect-will need to continue fluid restriction <1.2 L/day  Subjective:   No acute events overnight, INR this morning 1.3    Objective:   BP 97/85   Pulse 81   Temp 98.5 F (36.9 C) (Oral)   Resp 13   Ht 5\' 5"  (1.651 m)   Wt 82.1 kg (180 lb 14.4 oz)   SpO2 98%   BMI 30.10 kg/m   Physical Exam: [id number] resting on dialysis CVS: Pulse regular rhythm, normal rate, S1 and S2 normal Resp: Anteriorly clear to auscultation, no rales or rhonchi Abd: Soft, obese, nontender, LVAD tubes noted RUQ Ext: Trace ankle edema, intact dressing right pretibial. Left IJ TDC dressing dry/clean. Left BCF without bruit or thrill--appears to be thrombosed.  Labs: BMET  Recent  Labs Lab 06/29/17 0500 06/30/17 0530 07/01/17 0211 07/01/17 0409 07/02/17 0232 07/03/17 0453 07/04/17 0148  NA 134* 133* 133* 136 133* 133* 132*  K 3.8 3.8 4.0 4.0 4.1 4.0 4.1  CL 96* 95* 95* 100* 98* 97* 92*  CO2 28 26 26 28 26 27 29   GLUCOSE 80 101* 118* 92 188* 139* 163*  BUN 33* 46* 56* 20 29* 19 31*  CREATININE 4.69* 5.72* 6.48* 2.71* 4.06* 2.98* 4.13*  CALCIUM 8.9 8.9 8.6* 8.1* 8.7* 8.8* 9.1  PHOS 4.1 4.2 5.0* 2.0* 3.9 3.8 4.6   CBC  Recent Labs Lab 07/01/17 0210 07/02/17 0231 07/03/17 0453 07/04/17 0148  WBC 10.5 8.3 7.5 8.0  HGB 7.8* 8.4* 8.1* 8.2*  HCT 24.3* 27.1* 25.5* 25.6*  MCV 97.2 98.9 99.2 96.6  PLT 99* 98* 94* 113*   Medications:    . atorvastatin  40 mg Oral Daily  . budesonide  0.25 mg Nebulization BID  . busPIRone  5 mg Oral BID  . calcitRIOL  0.5 mcg Oral QODAY  . Chlorhexidine Gluconate Cloth  6 each Topical Daily  . darbepoetin (ARANESP) injection - NON-DIALYSIS  150 mcg Subcutaneous Q Fri-1800  . docusate sodium  100 mg Oral BID  . furosemide  160 mg Oral BID  . gabapentin  300 mg Oral QHS  . hydrALAZINE  10 mg Oral Q8H  . insulin aspart  0-15 Units Subcutaneous TID WC  . insulin aspart  0-5 Units Subcutaneous QHS  . insulin glargine  17 Units Subcutaneous QHS  . levothyroxine  25 mcg Oral QAC breakfast  . multivitamin  1 tablet Oral QHS  . neomycin-bacitracin-polymyxin   Topical Daily  . pantoprazole  40 mg  Oral Q1200  . rOPINIRole  0.25 mg Oral QHS  . sevelamer carbonate  2.4 g Oral TID WC  . sildenafil  40 mg Oral TID  . sodium chloride flush  10-40 mL Intracatheter Q12H  . Warfarin - Pharmacist Dosing Inpatient   Does not apply [id number]   Elmarie Shiley, MD 07/04/2017, 7:36 AM

## 2017-07-04 NOTE — Progress Notes (Signed)
CARDIAC REHAB PHASE I   PRE:  Rate/Rhythm: 100 afib    BP: sitting 102/75 (85)    SaO2:   MODE:  Ambulation: 350 ft   POST:  Rate/Rhythm: 108 afib with PVCs    BP: sitting 98/73 (81)     SaO2:   Pt feeling tired and c/o left arm pain at fistula site. Still willing to walk. Slow pace, rest x3. To recliner. VSS.  [id number]   Darrick Meigs CES, ACSM 07/04/2017 2:05 PM

## 2017-07-04 NOTE — Procedures (Signed)
Patient seen on Hemodialysis. QB 400, UF goal 1L Treatment adjusted as needed.  Elmarie Shiley MD [facility]. Office # [phone] Pager # [phone] 9:39 AM

## 2017-07-04 NOTE — Progress Notes (Signed)
ANTICOAGULATION CONSULT NOTE - Follow Up Consult  Pharmacy Consult for Heparin and Coumadin Indication: LVAD  No Known Allergies  Patient Measurements: Height: 5\' 5"  (165.1 cm) Weight: 181 lb (82.1 kg) (stood to scale ) IBW/kg (Calculated) : 61.5 Heparin Dosing Weight: 80.7kg  Vital Signs: Temp: 97.7 F (36.5 C) (08/31 0740) Temp Source: Oral (08/31 0740) BP: 105/64 (08/31 1000) Pulse Rate: 62 (08/31 1000)  Labs:  Recent Labs  07/02/17 0231 07/02/17 0232 07/03/17 0453 07/04/17 0148  HGB 8.4*  --  8.1* 8.2*  HCT 27.1*  --  25.5* 25.6*  PLT 98*  --  94* 113*  APTT 181*  --   --   --   LABPROT  --  15.4* 15.2 15.7*  INR  --  1.23 1.21 1.26  HEPARINUNFRC 0.35  --  0.33 0.38  CREATININE  --  4.06* 2.98* 4.13*    Estimated Creatinine Clearance: 16.2 mL/min (A) (by C-G formula based on SCr of 4.13 mg/dL (H)).   Medications: Heparin @ 1450 units/hr  Assessment: 70yom with LVAD resumed on heparin and coumadin 8/27 after left AV fistula and right TDC placement. Heparin level is therapeutic at 0.38. INR below goal 1.23 and not moving despite increasing doses that are much higher than his home dose - unsure why,no drug interactions identified and patient says he is not eating salads/greens. Hgb/platelets low but stable. LDH ok. No further bleeding at [facility] site, however, hematoma noted at fistula site.  PTA dose: 5mg  daily except 7.5mg  on Tuesday  Goal of Therapy:  INR 2-2.5 Heparin level 0.3-0.5 units/ml Monitor platelets by anticoagulation protocol: Yes   Plan:  1) Continue heparin at 1450 units/hr 2) Increase coumadin to 15mg  tonight 3) Daily heparin level, INR, CBC  Deboraha Sprang 07/04/2017,11:01 AM

## 2017-07-04 NOTE — Progress Notes (Signed)
   On exam I cannot feel thrill in avf but there is an audible thrill and confirmed with doppler. Explained that avf may not mature to be useful but at this time I would stay the course. He has f/u on 10.5 with Dr. Bridgett Larsson.   Brandon C. Donzetta Matters, MD [facility] Office: [phone] Pager: [phone]

## 2017-07-05 LAB — BPAM RBC
Blood Product Expiration Date: 201809052359
UNIT TYPE AND RH: 600

## 2017-07-05 LAB — CBC
HEMATOCRIT: 25.8 % — AB (ref 39.0–52.0)
Hemoglobin: 8.2 g/dL — ABNORMAL LOW (ref 13.0–17.0)
MCH: 30.5 pg (ref 26.0–34.0)
MCHC: 31.8 g/dL (ref 30.0–36.0)
MCV: 95.9 fL (ref 78.0–100.0)
Platelets: 115 10*3/uL — ABNORMAL LOW (ref 150–400)
RBC: 2.69 MIL/uL — ABNORMAL LOW (ref 4.22–5.81)
RDW: 18 % — AB (ref 11.5–15.5)
WBC: 7 10*3/uL (ref 4.0–10.5)

## 2017-07-05 LAB — GLUCOSE, CAPILLARY
GLUCOSE-CAPILLARY: 131 mg/dL — AB (ref 65–99)
Glucose-Capillary: 168 mg/dL — ABNORMAL HIGH (ref 65–99)
Glucose-Capillary: 105 mg/dL — ABNORMAL HIGH (ref 65–99)
Glucose-Capillary: 196 mg/dL — ABNORMAL HIGH (ref 65–99)

## 2017-07-05 LAB — RENAL FUNCTION PANEL
ALBUMIN: 3.4 g/dL — AB (ref 3.5–5.0)
Anion gap: 10 (ref 5–15)
BUN: 16 mg/dL (ref 6–20)
CHLORIDE: 94 mmol/L — AB (ref 101–111)
CO2: 27 mmol/L (ref 22–32)
CREATININE: 3.03 mg/dL — AB (ref 0.61–1.24)
Calcium: 8.9 mg/dL (ref 8.9–10.3)
GFR calc Af Amer: 22 mL/min — ABNORMAL LOW (ref >60)
GFR, EST NON AFRICAN AMERICAN: 19 mL/min — AB (ref >60)
Glucose, Bld: 148 mg/dL — ABNORMAL HIGH (ref 65–99)
PHOSPHORUS: 3.4 mg/dL (ref 2.5–4.6)
Potassium: 3.7 mmol/L (ref 3.5–5.1)
Sodium: 131 mmol/L — ABNORMAL LOW (ref 135–145)

## 2017-07-05 LAB — HEPARIN LEVEL (UNFRACTIONATED): HEPARIN UNFRACTIONATED: 0.3 [IU]/mL (ref 0.30–0.70)

## 2017-07-05 LAB — TYPE AND SCREEN
ABO/RH(D): A NEG
ANTIBODY SCREEN: NEGATIVE
UNIT DIVISION: 0

## 2017-07-05 LAB — PROTIME-INR
INR: 1.62
PROTHROMBIN TIME: 19.1 s — AB (ref 11.4–15.2)

## 2017-07-05 LAB — LACTATE DEHYDROGENASE: LDH: 205 U/L — ABNORMAL HIGH (ref 98–192)

## 2017-07-05 MED ORDER — WARFARIN SODIUM 2.5 MG PO TABS
12.5000 mg | ORAL_TABLET | Freq: Once | ORAL | Status: AC
  Administered 2017-07-05: 12.5 mg via ORAL
  Filled 2017-07-05: qty 1

## 2017-07-05 MED ORDER — FUROSEMIDE 80 MG PO TABS
160.0000 mg | ORAL_TABLET | Freq: Two times a day (BID) | ORAL | Status: DC
  Administered 2017-07-05 – 2017-07-06 (×4): 160 mg via ORAL
  Filled 2017-07-05 (×4): qty 2

## 2017-07-05 NOTE — Progress Notes (Signed)
ANTICOAGULATION CONSULT NOTE - Follow Up Consult  Pharmacy Consult for Heparin and Coumadin Indication: LVAD  No Known Allergies  Patient Measurements: Height: 5\' 5"  (165.1 cm) Weight: 181 lb (82.1 kg) IBW/kg (Calculated) : 61.5 Heparin Dosing Weight: 80.7kg  Vital Signs: Temp: 98.4 F (36.9 C) (09/01 0726) Temp Source: Oral (09/01 0726) BP: 92/70 (09/01 0726) Pulse Rate: 65 (09/01 0726)  Labs:  Recent Labs  07/03/17 0453 07/04/17 0148 07/05/17 0309  HGB 8.1* 8.2* 8.2*  HCT 25.5* 25.6* 25.8*  PLT 94* 113* 115*  LABPROT 15.2 15.7* 19.1*  INR 1.21 1.26 1.62  HEPARINUNFRC 0.33 0.38 0.30  CREATININE 2.98* 4.13* 3.03*    Estimated Creatinine Clearance: 22 mL/min (A) (by C-G formula based on SCr of 3.03 mg/dL (H)).   Medications: Heparin @ 1450 units/hr  Assessment: 70yom with LVAD resumed on heparin and coumadin 8/27 after left AV fistula and right TDC placement.   Heparin level is therapeutic at 0.3. INR below goal but trending up to 1.6. Hgb/platelets low but stable. LDH ok. No further bleeding at [facility] site, however, hematoma noted at fistula site.  PTA dose: 5mg  daily except 7.5mg  on Tuesday  Goal of Therapy:  INR 2-2.5 Heparin level 0.3-0.5 units/ml Monitor platelets by anticoagulation protocol: Yes   Plan:  1) Continue heparin at 1450 units/hr 2) Give coumadin 12.5 mg tonight then back down to closer to home dose 3) Daily heparin level, INR, CBC  Erin Hearing PharmD., BCPS Clinical Pharmacist Pager [phone] 07/05/2017 7:47 AM

## 2017-07-05 NOTE — Progress Notes (Signed)
Patient ID: GRADIE OHM, male   DOB: 14-May-1946, 71 y.o.   MRN: [id number] [facility] Progress Note   Assessment/ Plan:   1. Acute on chronic systolic congestive heart failure secondary to ischemic cardiomyopathy: Status post LVAD with symptomatic improvement and now on chronic hemodialysis. Remains on furosemide to augment urine output with some residual renal function. 2.ESRD: Scheduled for hemodialysis on Monday/Wednesday/Friday, he will continue dialysis at [facility] on [facility] upon discharge (LVAD representative to provide education and physical presence at the dialysis unit for the first few treatments). LUA BCF examination limited by surgical site swelling/hematoma. 3. Anemia: Hemoglobin stable at this time, continue to monitor for overt blood loss. Continue Aranesp. 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 Nutrition: Continue nutritional supplementation with renal multivitamin/protein supplements 6. Hypertension: Blood pressures on the lower side but the patient remains asymptomatic-continue to monitor with cautious ultrafiltration and hemodialysis. 7. Hyponatremia: Secondary to ESRD/free water excretion defect-will need to continue fluid restriction <1.2 L/day  Subjective:   Tolerated hemodialysis yesterday with reduction of ultrafiltration goal given his blood pressures, INR this morning 1.6. Reports significant improvement of left arm pain.    Objective:   BP 92/70 (BP Location: Right Arm)   Pulse 65   Temp 98.4 F (36.9 C) (Oral)   Resp 13   Ht 5\' 5"  (1.651 m)   Wt 82.1 kg (181 lb)   SpO2 96%   BMI 30.12 kg/m   Physical Exam: [id number] resting in bed CVS: Pulse regular rhythm, normal rate, S1 and S2 normal Resp: Anteriorly clear to auscultation, no rales or rhonchi Abd: Soft, obese, nontender, LVAD tubes noted RUQ Ext: Trace ankle edema, intact dressing right pretibial.  Left IJ TDC dressing dry/clean. Left BCF with faint thrill and low pitched bruit.  Labs: BMET  Recent Labs Lab 06/30/17 0530 07/01/17 0211 07/01/17 0409 07/02/17 0232 07/03/17 0453 07/04/17 0148 07/05/17 0309  NA 133* 133* 136 133* 133* 132* 131*  K 3.8 4.0 4.0 4.1 4.0 4.1 3.7  CL 95* 95* 100* 98* 97* 92* 94*  CO2 26 26 28 26 27 29 27   GLUCOSE 101* 118* 92 188* 139* 163* 148*  BUN 46* 56* 20 29* 19 31* 16  CREATININE 5.72* 6.48* 2.71* 4.06* 2.98* 4.13* 3.03*  CALCIUM 8.9 8.6* 8.1* 8.7* 8.8* 9.1 8.9  PHOS 4.2 5.0* 2.0* 3.9 3.8 4.6 3.4   CBC  Recent Labs Lab 07/02/17 0231 07/03/17 0453 07/04/17 0148 07/05/17 0309  WBC 8.3 7.5 8.0 7.0  HGB 8.4* 8.1* 8.2* 8.2*  HCT 27.1* 25.5* 25.6* 25.8*  MCV 98.9 99.2 96.6 95.9  PLT 98* 94* 113* 115*   Medications:    . atorvastatin  40 mg Oral Daily  . budesonide  0.25 mg Nebulization BID  . busPIRone  5 mg Oral BID  . calcitRIOL  0.5 mcg Oral QODAY  . Chlorhexidine Gluconate Cloth  6 each Topical Daily  . darbepoetin (ARANESP) injection - NON-DIALYSIS  150 mcg Subcutaneous Q Fri-1800  . docusate sodium  100 mg Oral BID  . gabapentin  300 mg Oral QHS  . insulin aspart  0-15 Units Subcutaneous TID WC  . insulin aspart  0-5 Units Subcutaneous QHS  . insulin glargine  17 Units Subcutaneous QHS  . levothyroxine  25 mcg Oral QAC breakfast  . multivitamin  1 tablet Oral QHS  . neomycin-bacitracin-polymyxin   Topical Daily  . pantoprazole  40 mg Oral Q1200  .  rOPINIRole  0.25 mg Oral QHS  . sevelamer carbonate  2.4 g Oral TID WC  . sildenafil  40 mg Oral TID  . sodium chloride flush  10-40 mL Intracatheter Q12H  . warfarin  12.5 mg Oral ONCE-1800  . Warfarin - Pharmacist Dosing Inpatient   Does not apply [id number]   Elmarie Shiley, MD 07/05/2017, 8:12 AM

## 2017-07-05 NOTE — Progress Notes (Signed)
Patient ID: Trevor [name], male   DOB: [birth year], 71 y.o.   MRN: [id number]    Advanced Heart Failure VAD Team Note  Subjective:    Started on CVVHD 06/17/17 via temporary cath.   CVVHD stopped 8/17.   Tunneled HD catheter and AV fistula placed 8/27. Warfarin restarted. INR 1.62 today.   Pain at left arm fistula site feeling better.   MAP 70s-80s.   Dr Donzetta Matters assessed fistula today, not clotted.   Studies: RHC (06/13/17):  RA = 17 RV = 58/18 PA = 56/25 (37) PCW = 20 (v = 35) Fick cardiac output/index = 6.4/3.2 Thermo CO/CI = 4.1/2.1 PVR = 2.6 Ao sat = 98% PA sat = 56%, 60% Increased LVAD speed to 9600   PCW down to 19  LVAD INTERROGATION:  HeartMate II LVAD:  Flow 6 liters/min, speed 9600 power 6.4, PI 2.8.  PI events yesterday during HD, 2 PI events since then.   Objective:    Vital Signs:   Temp:  [97.7 F (36.5 C)-98.6 F (37 C)] 98.4 F (36.9 C) (09/01 0726) Pulse Rate:  [62-91] 65 (09/01 0726) Resp:  [13-18] 13 (09/01 0726) BP: (73-105)/(44-87) 92/70 (09/01 0726) SpO2:  [93 %-99 %] 96 % (09/01 0743) Weight:  [180 lb 1.9 oz (81.7 kg)-181 lb (82.1 kg)] 181 lb (82.1 kg) (09/01 0408) Last BM Date: 07/04/17 Mean arterial Pressure 70s-80s  Intake/Output:   Intake/Output Summary (Last 24 hours) at 07/05/17 0931 Last data filed at 07/05/17 0800  Gross per 24 hour  Intake           1474.4 ml  Output             1286 ml  Net            188.4 ml     Physical Exam    Physical Exam: GENERAL: NAD HEENT: normal  NECK: Supple, JVP ~9-10.  No lymphadenopathy or thyromegaly appreciated.   CARDIAC:  Irregular. LVAD hum present.  LUNGS: coarse throughout.   ABDOMEN:  Soft, round, nontender, positive bowel sounds x4.     LVAD exit site: well-healed and incorporated.  Dressing dry and intact.  No erythema or drainage.  Stabilization device present and accurately applied.  Driveline dressing is being changed daily per sterile technique. EXTREMITIES:  Warm and dry, no  cyanosis, clubbing, rash. LUE fistula tender to touch  NEUROLOGIC:  Alert and oriented x 4.  Gait steady.  No aphasia.  No dysarthria.  Affect pleasant.     Telemetry   Personally reviewed A fib NSVT 90-120s   Labs   Basic Metabolic Panel:  Recent Labs Lab 07/01/17 0409 07/02/17 0232 07/03/17 0453 07/04/17 0148 07/04/17 1444 07/05/17 0309  NA 136 133* 133* 132*  --  131*  K 4.0 4.1 4.0 4.1  --  3.7  CL 100* 98* 97* 92*  --  94*  CO2 28 26 27 29   --  27  GLUCOSE 92 188* 139* 163*  --  148*  BUN 20 29* 19 31*  --  16  CREATININE 2.71* 4.06* 2.98* 4.13*  --  3.03*  CALCIUM 8.1* 8.7* 8.8* 9.1  --  8.9  MG  --   --   --   --  1.6*  --   PHOS 2.0* 3.9 3.8 4.6  --  3.4    Liver Function Tests:  Recent Labs Lab 07/01/17 0409 07/02/17 0232 07/03/17 0453 07/04/17 0148 07/05/17 0309  ALBUMIN 3.3* 3.6 3.3* 3.4* 3.4*  No results for input(s): LIPASE, AMYLASE in the last 168 hours. No results for input(s): AMMONIA in the last 168 hours.  CBC:  Recent Labs Lab 07/01/17 0210 07/02/17 0231 07/03/17 0453 07/04/17 0148 07/05/17 0309  WBC 10.5 8.3 7.5 8.0 7.0  HGB 7.8* 8.4* 8.1* 8.2* 8.2*  HCT 24.3* 27.1* 25.5* 25.6* 25.8*  MCV 97.2 98.9 99.2 96.6 95.9  PLT 99* 98* 94* 113* 115*    INR:  Recent Labs Lab 07/01/17 0406 07/02/17 0232 07/03/17 0453 07/04/17 0148 07/05/17 0309  INR 1.23 1.23 1.21 1.26 1.62    Other results:  EKG:    Imaging   No results found.  Medications:     Scheduled Medications: . atorvastatin  40 mg Oral Daily  . budesonide  0.25 mg Nebulization BID  . busPIRone  5 mg Oral BID  . calcitRIOL  0.5 mcg Oral QODAY  . Chlorhexidine Gluconate Cloth  6 each Topical Daily  . darbepoetin (ARANESP) injection - NON-DIALYSIS  150 mcg Subcutaneous Q Fri-1800  . docusate sodium  100 mg Oral BID  . furosemide  160 mg Oral BID  . gabapentin  300 mg Oral QHS  . insulin aspart  0-15 Units Subcutaneous TID WC  . insulin aspart  0-5 Units  Subcutaneous QHS  . insulin glargine  17 Units Subcutaneous QHS  . levothyroxine  25 mcg Oral QAC breakfast  . multivitamin  1 tablet Oral QHS  . neomycin-bacitracin-polymyxin   Topical Daily  . pantoprazole  40 mg Oral Q1200  . rOPINIRole  0.25 mg Oral QHS  . sevelamer carbonate  2.4 g Oral TID WC  . sildenafil  40 mg Oral TID  . sodium chloride flush  10-40 mL Intracatheter Q12H  . warfarin  12.5 mg Oral ONCE-1800  . Warfarin - Pharmacist Dosing Inpatient   Does not apply q1800    Infusions: . sodium chloride    . sodium chloride    . sodium chloride    . heparin 1,450 Units/hr (07/05/17 0800)    PRN Medications: sodium chloride, sodium chloride, acetaminophen, albuterol, alteplase, heparin, heparin, lidocaine (PF), lidocaine-prilocaine, ondansetron (ZOFRAN) IV, oxyCODONE, pentafluoroprop-tetrafluoroeth, sodium chloride flush, traMADol, traZODone   Patient Profile   Trevor [name] a 70 y.o.malewith a history of CAD s/p CABG x 4 2010, OSA, AS with TAVR 2015, CKD, DM2, paroxysmal atrial fibrillation, asthma, and ischemic cardiomyopathy with Med[employer]onic ICD.   He admitted with marked volume overload and AKI despite with increase in outpatient diuretics.    Assessment/Plan:    1. Acute on chronic systolic CHF: Ischemic cardiomyopathy, EF 20-25% with RV dysfunction on 2/18 echo pre-LVAD. 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 Medtronic ICD. Overall, he has felt much better post-LVAD. However, he developed AKI and was eventually admitted in 5/18 after RHC showed elevated filling pressures. He was diuresed and creatinine came down. Since then, we have adjusted up and down his torsemide dose, with higher dose volume improves but creatinine worsens. With lower dose we can make creatinine look better but he gets volume overloaded.  He has again developed worsening volume overload with AKI, creatinine up to 5.79.  He had RHC on 8/10 as above and milrinone 0.125 started, increased to 0.25  on 8/11.  Speed increased to 9600 on 8/10.  Main issue is severe RV failure. Milrinone stopped and CVVH begun. CVVH stopped 8/17.  Now getting iHD and tolerating so far. MAPS 70s-80s.  - No ASA with h/o GI bleeding - Restarted warfarin with INR goal 2-2.5,  stop heparin gtt when INR > 1.8.  INR 1.62.   - Volume management per iHD.  - Still makes urine, getting Lasix on non-HD days.  2. CAD: s/p CABG.  No CP.  - Continue atorvastatin. No change.  3. AKI on CKD stage IV: CVVH stopped 8/17. Tolerating HD so far, weight remaining stable.  Now has had fistula placed and tunneled catheter.  Fistula site pain better today, evaluated by VVS and does not appear clotted.  - Continue iHD, outpatient planning has begun. Have had LVAD nurse coordinators educate his outpatient site [facility]).  4. Atrial fibrillation: Chronic. On heparin gtt, warfarin restarted.   Inr 1.62.  5. RV failure: Continue Revatio 40 mg TID.  Now off milrinone.  6. HTN: Stable. Off antihypertensives.      7. Anemia: Baseline anemia of renal disease/chronic disease but recent bleeding from colonic AVMs. No overt bleeding, may be primarily anemia of renal disease at this point. FOBT negative.  8. 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 - No overt bleeding. Suspect related to anemia of renal disease. FOBT negative this admission.  - Continue octreotide injections. - No ASA, warfarin INR goal 2-2.5.  9. Skin cancer: On neck and RLE. Needs excision, does not have to stop warfarin. Will address further as outpatient. WOC assessed biopsy sites  - No change.  10. Thrombocytopenia: Chronic, mild, stable. No change. Platelets 115.   11. Diabetes:  - Stable. No change.   INR 1.2.    I reviewed the LVAD parameters from today, and compared the results to the patient's prior recorded data.  No programming changes were made.  The LVAD is functioning within specified parameters.  The patient performs LVAD  self-test daily.  LVAD interrogation was negative for any significant power changes, alarms or PI events/speed drops.  LVAD equipment check completed and is in good working order.  Back-up equipment present.   LVAD education done on emergency procedures and precautions and reviewed exit site care.  Length of Stay: [place], MD 07/05/2017, 9:31 AM  VAD Team --- VAD ISSUES ONLY--- Pager [phone] (7am - 7am)  Advanced Heart Failure Team  Pager [phone] (M-F; 7a - 4p)  Please contact [facility] for night-coverage after hours (4p -7a ) and weekends on [email or web address]

## 2017-07-05 NOTE — Progress Notes (Signed)
Trevor [name] is walking independently on the unit will sign off. Barnet Pall, RN,BSN 07/05/2017 12:09 PM

## 2017-07-06 LAB — PROTIME-INR
INR: 2.18
PROTHROMBIN TIME: 24.1 s — AB (ref 11.4–15.2)

## 2017-07-06 LAB — LACTATE DEHYDROGENASE: LDH: 197 U/L — AB (ref 98–192)

## 2017-07-06 LAB — CBC
HEMATOCRIT: 24.4 % — AB (ref 39.0–52.0)
HEMOGLOBIN: 7.8 g/dL — AB (ref 13.0–17.0)
MCH: 30.5 pg (ref 26.0–34.0)
MCHC: 32 g/dL (ref 30.0–36.0)
MCV: 95.3 fL (ref 78.0–100.0)
Platelets: 132 10*3/uL — ABNORMAL LOW (ref 150–400)
RBC: 2.56 MIL/uL — ABNORMAL LOW (ref 4.22–5.81)
RDW: 17.7 % — ABNORMAL HIGH (ref 11.5–15.5)
WBC: 8.4 10*3/uL (ref 4.0–10.5)

## 2017-07-06 LAB — GLUCOSE, CAPILLARY
GLUCOSE-CAPILLARY: 103 mg/dL — AB (ref 65–99)
GLUCOSE-CAPILLARY: 187 mg/dL — AB (ref 65–99)
Glucose-Capillary: 174 mg/dL — ABNORMAL HIGH (ref 65–99)
Glucose-Capillary: 127 mg/dL — ABNORMAL HIGH (ref 65–99)
Glucose-Capillary: 118 mg/dL — ABNORMAL HIGH (ref 65–99)

## 2017-07-06 LAB — RENAL FUNCTION PANEL
ALBUMIN: 3.4 g/dL — AB (ref 3.5–5.0)
Anion gap: 11 (ref 5–15)
BUN: 30 mg/dL — AB (ref 6–20)
CHLORIDE: 93 mmol/L — AB (ref 101–111)
CO2: 28 mmol/L (ref 22–32)
Calcium: 8.8 mg/dL — ABNORMAL LOW (ref 8.9–10.3)
Creatinine, Ser: 4.33 mg/dL — ABNORMAL HIGH (ref 0.61–1.24)
GFR calc Af Amer: 15 mL/min — ABNORMAL LOW (ref >60)
GFR calc non Af Amer: 13 mL/min — ABNORMAL LOW (ref >60)
Glucose, Bld: 178 mg/dL — ABNORMAL HIGH (ref 65–99)
PHOSPHORUS: 3.7 mg/dL (ref 2.5–4.6)
POTASSIUM: 3.7 mmol/L (ref 3.5–5.1)
Sodium: 132 mmol/L — ABNORMAL LOW (ref 135–145)

## 2017-07-06 LAB — HEPARIN LEVEL (UNFRACTIONATED): Heparin Unfractionated: 0.3 IU/mL (ref 0.30–0.70)

## 2017-07-06 MED ORDER — WARFARIN SODIUM 5 MG PO TABS
5.0000 mg | ORAL_TABLET | Freq: Once | ORAL | Status: AC
  Administered 2017-07-06: 5 mg via ORAL
  Filled 2017-07-06: qty 1

## 2017-07-06 NOTE — Progress Notes (Signed)
Patient ID: Trevor [name], male   DOB: 1946-01-22, 70 y.o.   MRN: [id number] [facility] Progress Note   Assessment/ Plan:   1. Acute on chronic systolic congestive heart failure secondary to ischemic cardiomyopathy: Status post LVAD with symptomatic improvement and now on chronic hemodialysis. Recommend continuing furosemide at this time to augment his urine output as he still has a fair residual renal function. 2. ESRD: Currently on a Monday/Wednesday/Friday schedule with next hemodialysis ordered for tomorrow, still awaiting outpatient dialysis unit placement possibly at [facility] (unsure if we will be able to get confirmation regarding placement tomorrow with Labor Day holiday). LUA BCF with faint thrill. 3. Anemia: Hemoglobin stable at this time, continue to monitor for overt blood loss. Continue Aranesp. 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 Nutrition: Continue nutritional supplementation with renal multivitamin/protein supplements 6. Hypertension: Blood pressures on the lower side but the patient remains asymptomatic-continue to monitor with cautious ultrafiltration and hemodialysis. 7. Hyponatremia: Secondary to ESRD/free water excretion defect-will need to continue fluid restriction <1.2 L/day and continue furosemide.   Subjective:   Report some intermittent discomfort over left upper arm/surgical site. Denies chest pain or shortness of breath.    Objective:   BP 95/81 (BP Location: Right Arm)   Pulse 69   Temp 98.2 F (36.8 C) (Oral)   Resp 18   Ht 5\' 5"  (1.651 m)   Wt 83.6 kg (184 lb 3.2 oz)   SpO2 99%   BMI 30.65 kg/m   Physical Exam: [id number] resting in bed CVS: Pulse regular rhythm, normal rate, S1 and S2 normal Resp: Anteriorly clear to auscultation, no rales or rhonchi Abd: Soft, obese, nontender, LVAD tubes noted RUQ Ext: Trace ankle edema, intact dressing right  pretibial. Left IJ TDC dressing dry/clean. Left BCF with faint thrill and low pitched bruit.  Labs: BMET  Recent Labs Lab 07/01/17 0211 07/01/17 0409 07/02/17 0232 07/03/17 0453 07/04/17 0148 07/05/17 0309 07/06/17 0323  NA 133* 136 133* 133* 132* 131* 132*  K 4.0 4.0 4.1 4.0 4.1 3.7 3.7  CL 95* 100* 98* 97* 92* 94* 93*  CO2 26 28 26 27 29 27 28   GLUCOSE 118* 92 188* 139* 163* 148* 178*  BUN 56* 20 29* 19 31* 16 30*  CREATININE 6.48* 2.71* 4.06* 2.98* 4.13* 3.03* 4.33*  CALCIUM 8.6* 8.1* 8.7* 8.8* 9.1 8.9 8.8*  PHOS 5.0* 2.0* 3.9 3.8 4.6 3.4 3.7   CBC  Recent Labs Lab 07/03/17 0453 07/04/17 0148 07/05/17 0309 07/06/17 0323  WBC 7.5 8.0 7.0 8.4  HGB 8.1* 8.2* 8.2* 7.8*  HCT 25.5* 25.6* 25.8* 24.4*  MCV 99.2 96.6 95.9 95.3  PLT 94* 113* 115* 132*   Medications:    . atorvastatin  40 mg Oral Daily  . budesonide  0.25 mg Nebulization BID  . busPIRone  5 mg Oral BID  . calcitRIOL  0.5 mcg Oral QODAY  . Chlorhexidine Gluconate Cloth  6 each Topical Daily  . darbepoetin (ARANESP) injection - NON-DIALYSIS  150 mcg Subcutaneous Q Fri-1800  . docusate sodium  100 mg Oral BID  . furosemide  160 mg Oral BID  . gabapentin  300 mg Oral QHS  . insulin aspart  0-15 Units Subcutaneous TID WC  . insulin aspart  0-5 Units Subcutaneous QHS  . insulin glargine  17 Units Subcutaneous QHS  . levothyroxine  25 mcg Oral QAC breakfast  . multivitamin  1 tablet Oral QHS  .  neomycin-bacitracin-polymyxin   Topical Daily  . pantoprazole  40 mg Oral Q1200  . rOPINIRole  0.25 mg Oral QHS  . sevelamer carbonate  2.4 g Oral TID WC  . sildenafil  40 mg Oral TID  . sodium chloride flush  10-40 mL Intracatheter Q12H  . Warfarin - Pharmacist Dosing Inpatient   Does not apply [id number]   Elmarie Shiley, MD 07/06/2017, 8:49 AM

## 2017-07-06 NOTE — Progress Notes (Signed)
Patient ID: Trevor [name], male   DOB: Dec 27, 1945, 71 y.o.   MRN: [id number]    Advanced Heart Failure VAD Team Note  Subjective:    Started on CVVHD 06/17/17 via temporary cath.   CVVHD stopped 8/17.   Tunneled HD catheter and AV fistula placed 8/27. Warfarin restarted. INR 2.18 today.   Pain at left arm fistula site feeling better.   MAP 70s-80s.   Dr Donzetta Matters assessed fistula yesterday, not clotted.   Studies: RHC (06/13/17):  RA = 17 RV = 58/18 PA = 56/25 (37) PCW = 20 (v = 35) Fick cardiac output/index = 6.4/3.2 Thermo CO/CI = 4.1/2.1 PVR = 2.6 Ao sat = 98% PA sat = 56%, 60% Increased LVAD speed to 9600   PCW down to 19  LVAD INTERROGATION:  HeartMate II LVAD:  Flow 6 liters/min, speed 9600 power 5.8, PI 3.  5 PI events last 24 hrs.   Objective:    Vital Signs:   Temp:  [98 F (36.7 C)-98.5 F (36.9 C)] 98.2 F (36.8 C) (09/02 0423) Pulse Rate:  [53-90] 69 (09/02 0817) Resp:  [10-22] 18 (09/02 0500) BP: (95-122)/(67-106) 95/81 (09/02 0423) SpO2:  [92 %-100 %] 99 % (09/02 0817) Weight:  [184 lb 3.2 oz (83.6 kg)] 184 lb 3.2 oz (83.6 kg) (09/02 0500) Last BM Date: 07/05/17 Mean arterial Pressure 70s-80s  Intake/Output:   Intake/Output Summary (Last 24 hours) at 07/06/17 0838 Last data filed at 07/06/17 0817  Gross per 24 hour  Intake          1192.12 ml  Output             1200 ml  Net            -7.88 ml     Physical Exam    Physical Exam: GENERAL: NAD HEENT: normal  NECK: Supple, JVP 10. No lymphadenopathy or thyromegaly appreciated.  CARDIAC: Irregular. LVAD hum present.  LUNGS: coarse throughout.  ABDOMEN: Soft, round, nontender, positive bowel sounds x4.  LVAD exit site: well-healed and incorporated. Dressing dry and intact. No erythema or drainage. Stabilization device present and accurately applied. Driveline dressing is being changed daily per sterile technique. EXTREMITIES: Warm and dry, no cyanosis, clubbing, rash. 1+ edema  right ankle.   NEUROLOGIC: Alert and oriented x 4. Gait steady. No aphasia. No dysarthria. Affect pleasant.   Telemetry   Personally reviewed A fib 80s   Labs   Basic Metabolic Panel:  Recent Labs Lab 07/02/17 0232 07/03/17 0453 07/04/17 0148 07/04/17 1444 07/05/17 0309 07/06/17 0323  NA 133* 133* 132*  --  131* 132*  K 4.1 4.0 4.1  --  3.7 3.7  CL 98* 97* 92*  --  94* 93*  CO2 26 27 29   --  27 28  GLUCOSE 188* 139* 163*  --  148* 178*  BUN 29* 19 31*  --  16 30*  CREATININE 4.06* 2.98* 4.13*  --  3.03* 4.33*  CALCIUM 8.7* 8.8* 9.1  --  8.9 8.8*  MG  --   --   --  1.6*  --   --   PHOS 3.9 3.8 4.6  --  3.4 3.7    Liver Function Tests:  Recent Labs Lab 07/02/17 0232 07/03/17 0453 07/04/17 0148 07/05/17 0309 07/06/17 0323  ALBUMIN 3.6 3.3* 3.4* 3.4* 3.4*   No results for input(s): LIPASE, AMYLASE in the last 168 hours. No results for input(s): AMMONIA in the last 168 hours.  CBC:  Recent  Labs Lab 07/02/17 0231 07/03/17 0453 07/04/17 0148 07/05/17 0309 07/06/17 0323  WBC 8.3 7.5 8.0 7.0 8.4  HGB 8.4* 8.1* 8.2* 8.2* 7.8*  HCT 27.1* 25.5* 25.6* 25.8* 24.4*  MCV 98.9 99.2 96.6 95.9 95.3  PLT 98* 94* 113* 115* 132*    INR:  Recent Labs Lab 07/02/17 0232 07/03/17 0453 07/04/17 0148 07/05/17 0309 07/06/17 0323  INR 1.23 1.21 1.26 1.62 2.18    Other results:  EKG:    Imaging   No results found.  Medications:     Scheduled Medications: . atorvastatin  40 mg Oral Daily  . budesonide  0.25 mg Nebulization BID  . busPIRone  5 mg Oral BID  . calcitRIOL  0.5 mcg Oral QODAY  . Chlorhexidine Gluconate Cloth  6 each Topical Daily  . darbepoetin (ARANESP) injection - NON-DIALYSIS  150 mcg Subcutaneous Q Fri-1800  . docusate sodium  100 mg Oral BID  . furosemide  160 mg Oral BID  . gabapentin  300 mg Oral QHS  . insulin aspart  0-15 Units Subcutaneous TID WC  . insulin aspart  0-5 Units Subcutaneous QHS  . insulin glargine  17 Units  Subcutaneous QHS  . levothyroxine  25 mcg Oral QAC breakfast  . multivitamin  1 tablet Oral QHS  . neomycin-bacitracin-polymyxin   Topical Daily  . pantoprazole  40 mg Oral Q1200  . rOPINIRole  0.25 mg Oral QHS  . sevelamer carbonate  2.4 g Oral TID WC  . sildenafil  40 mg Oral TID  . sodium chloride flush  10-40 mL Intracatheter Q12H  . Warfarin - Pharmacist Dosing Inpatient   Does not apply q1800    Infusions: . sodium chloride    . sodium chloride    . sodium chloride      PRN Medications: sodium chloride, sodium chloride, acetaminophen, albuterol, alteplase, heparin, heparin, lidocaine (PF), lidocaine-prilocaine, ondansetron (ZOFRAN) IV, oxyCODONE, pentafluoroprop-tetrafluoroeth, sodium chloride flush, traMADol, traZODone   Patient Profile   Trevor Skillman Murphyis a 71 y.o.malewith a history of CAD s/p CABG x 4 2010, OSA, AS with TAVR 2015, CKD, DM2, paroxysmal atrial fibrillation, asthma, and ischemic cardiomyopathy with [employer] ICD.   He admitted with marked volume overload and AKI despite with increase in outpatient diuretics.    Assessment/Plan:    1. Acute on chronic systolic CHF: Ischemic cardiomyopathy, EF 20-25% with RV dysfunction on 2/18 echo pre-LVAD. 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 Medtronic ICD. Overall, he has felt much better post-LVAD. However, he developed AKI and was eventually admitted in 5/18 after RHC showed elevated filling pressures. He was diuresed and creatinine came down. Since then, we have adjusted up and down his torsemide dose, with higher dose volume improves but creatinine worsens. With lower dose we can make creatinine look better but he gets volume overloaded.  He has again developed worsening volume overload with AKI, creatinine up to 5.79.  He had RHC on 8/10 as above and milrinone 0.125 started, increased to 0.25 on 8/11.  Speed increased to 9600 on 8/10.  Main issue is severe RV failure. Milrinone stopped and CVVH begun. CVVH stopped  8/17.  Now getting iHD and tolerating so far. MAPS 70s-80s.  - No ASA with h/o GI bleeding - Restarted warfarin with INR goal 2-2.5, can stop heparin gtt today.   - Volume management per iHD.  - Still makes urine, getting Lasix po per nephrology.  2. CAD: s/p CABG.  No CP.  - Continue atorvastatin. No change.  3. AKI on  CKD stage IV: CVVH stopped 8/17. Tolerating HD so far, weight remaining stable.  Now has had fistula placed and tunneled catheter.  Fistula site pain better today, evaluated by VVS and does not appear clotted.  - Continue iHD, outpatient planning has begun. Have had LVAD nurse coordinators educate his outpatient site [facility]).  4. Atrial fibrillation: Chronic, on warfarin.  5. RV failure: Continue Revatio 40 mg TID.  Now off milrinone.  6. HTN: Stable. Off antihypertensives.      7. Anemia: Baseline anemia of renal disease/chronic disease but recent bleeding from colonic AVMs. No overt bleeding, may be primarily anemia of renal disease at this point. FOBT negative.  8. 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 - No overt bleeding. Suspect related to anemia of renal disease. FOBT negative this admission.  - Continue octreotide injections. - No ASA, warfarin INR goal 2-2.5.  9. Skin cancer: On neck and RLE. Needs excision, does not have to stop warfarin. Will address further as outpatient. WOC assessed biopsy sites  - No change.  10. Thrombocytopenia: Chronic, mild, stable. No change. Platelets 132.   11. Diabetes:  - Stable. No change.   Will plan on HD here tomorrow then discharge tomorrow after HD.    I reviewed the LVAD parameters from today, and compared the results to the patient's prior recorded data.  No programming changes were made.  The LVAD is functioning within specified parameters.  The patient performs LVAD self-test daily.  LVAD interrogation was negative for any significant power changes, alarms or PI events/speed drops.  LVAD  equipment check completed and is in good working order.  Back-up equipment present.   LVAD education done on emergency procedures and precautions and reviewed exit site care.  Length of Stay: 61  Loralie Champagne, MD 07/06/2017, 8:38 AM  VAD Team --- VAD ISSUES ONLY--- Pager [phone] (7am - 7am)  Advanced Heart Failure Team  Pager [phone] (M-F; 7a - 4p)  Please contact [facility] for night-coverage after hours (4p -7a ) and weekends on [email or web address]

## 2017-07-06 NOTE — Progress Notes (Signed)
ANTICOAGULATION CONSULT NOTE - Follow Up Consult  Pharmacy Consult for Heparin and Coumadin Indication: LVAD  No Known Allergies  Patient Measurements: Height: 5\' 5"  (165.1 cm) Weight: 184 lb 3.2 oz (83.6 kg) IBW/kg (Calculated) : 61.5 Heparin Dosing Weight: 80.7kg  Vital Signs: Temp: 98.2 F (36.8 C) (09/02 0817) Temp Source: Oral (09/02 0817) BP: 89/41 (09/02 0817) Pulse Rate: 69 (09/02 0817)  Labs:  Recent Labs  07/04/17 0148 07/05/17 0309 07/06/17 0323  HGB 8.2* 8.2* 7.8*  HCT 25.6* 25.8* 24.4*  PLT 113* 115* 132*  LABPROT 15.7* 19.1* 24.1*  INR 1.26 1.62 2.18  HEPARINUNFRC 0.38 0.30 0.30  CREATININE 4.13* 3.03* 4.33*    Estimated Creatinine Clearance: 15.6 mL/min (A) (by C-G formula based on SCr of 4.33 mg/dL (H)).   Medications: Heparin @ 1450 units/hr  Assessment: 70yom with LVAD resumed on heparin and coumadin 8/27 after left AV fistula and right TDC placement.   Heparin level is therapeutic at 0.3. INR trending up to 2.18. Hgb/platelets low but stable. LDH ok. No further bleeding at [facility] site. Heparin stopped this morning. INR likely to continue to rise, will back down on dosing.  PTA dose: 5mg  daily except 7.5mg  on Tuesday  Goal of Therapy:  INR 2-2.5 Heparin level 0.3-0.5 units/ml Monitor platelets by anticoagulation protocol: Yes   Plan:  1) Stop heparin 2) Warfarin 5mg  tonight 3) Daily heparin level, INR, CBC  Erin Hearing PharmD., BCPS Clinical Pharmacist Pager [phone] 07/06/2017 9:29 AM

## 2017-07-07 ENCOUNTER — Encounter (HOSPITAL_COMMUNITY): Payer: Self-pay | Admitting: Cardiology

## 2017-07-07 DIAGNOSIS — K5520 Angiodysplasia of colon without hemorrhage: Secondary | ICD-10-CM

## 2017-07-07 DIAGNOSIS — N186 End stage renal disease: Secondary | ICD-10-CM

## 2017-07-07 DIAGNOSIS — I4821 Permanent atrial fibrillation: Secondary | ICD-10-CM

## 2017-07-07 DIAGNOSIS — D638 Anemia in other chronic diseases classified elsewhere: Secondary | ICD-10-CM

## 2017-07-07 DIAGNOSIS — D696 Thrombocytopenia, unspecified: Secondary | ICD-10-CM

## 2017-07-07 HISTORY — DX: End stage renal disease: N18.6

## 2017-07-07 HISTORY — DX: Angiodysplasia of colon without hemorrhage: K55.20

## 2017-07-07 HISTORY — DX: Permanent atrial fibrillation: I48.21

## 2017-07-07 LAB — GLUCOSE, CAPILLARY
Glucose-Capillary: 121 mg/dL — ABNORMAL HIGH (ref 65–99)
Glucose-Capillary: 84 mg/dL (ref 65–99)

## 2017-07-07 LAB — CBC
HCT: 25.4 % — ABNORMAL LOW (ref 39.0–52.0)
Hemoglobin: 8.2 g/dL — ABNORMAL LOW (ref 13.0–17.0)
MCH: 31.2 pg (ref 26.0–34.0)
MCHC: 32.3 g/dL (ref 30.0–36.0)
MCV: 96.6 fL (ref 78.0–100.0)
PLATELETS: 143 10*3/uL — AB (ref 150–400)
RBC: 2.63 MIL/uL — AB (ref 4.22–5.81)
RDW: 17.7 % — AB (ref 11.5–15.5)
WBC: 8.2 10*3/uL (ref 4.0–10.5)

## 2017-07-07 LAB — RENAL FUNCTION PANEL
Albumin: 3.6 g/dL (ref 3.5–5.0)
Anion gap: 9 (ref 5–15)
BUN: 39 mg/dL — AB (ref 6–20)
CALCIUM: 9.1 mg/dL (ref 8.9–10.3)
CHLORIDE: 93 mmol/L — AB (ref 101–111)
CO2: 30 mmol/L (ref 22–32)
CREATININE: 5.36 mg/dL — AB (ref 0.61–1.24)
GFR calc non Af Amer: 10 mL/min — ABNORMAL LOW (ref >60)
GFR, EST AFRICAN AMERICAN: 11 mL/min — AB (ref >60)
Glucose, Bld: 118 mg/dL — ABNORMAL HIGH (ref 65–99)
Phosphorus: 3.7 mg/dL (ref 2.5–4.6)
Potassium: 4.6 mmol/L (ref 3.5–5.1)
SODIUM: 132 mmol/L — AB (ref 135–145)

## 2017-07-07 LAB — PROTIME-INR
INR: 3.14
PROTHROMBIN TIME: 32 s — AB (ref 11.4–15.2)

## 2017-07-07 LAB — LACTATE DEHYDROGENASE: LDH: 205 U/L — AB (ref 98–192)

## 2017-07-07 MED ORDER — WARFARIN SODIUM 5 MG PO TABS: 5.0000 mg | ORAL_TABLET | Freq: Every day | ORAL | 11 refills | Status: DC

## 2017-07-07 MED ORDER — GABAPENTIN 600 MG PO TABS: 300.0000 mg | ORAL_TABLET | Freq: Every day | ORAL | Status: DC

## 2017-07-07 MED ORDER — HEPARIN SODIUM (PORCINE) 1000 UNIT/ML DIALYSIS: 40.0000 [IU]/kg | INTRAMUSCULAR | Status: DC | PRN

## 2017-07-07 MED ORDER — CALCITRIOL 0.5 MCG PO CAPS: 0.5000 ug | ORAL_CAPSULE | ORAL | 6 refills | Status: DC

## 2017-07-07 MED ORDER — SEVELAMER CARBONATE 2.4 G PO PACK: 2.4000 g | PACK | Freq: Three times a day (TID) | ORAL | 1 refills | Status: DC

## 2017-07-07 MED ORDER — FUROSEMIDE 80 MG PO TABS: 160.0000 mg | ORAL_TABLET | Freq: Two times a day (BID) | ORAL | 6 refills | Status: DC

## 2017-07-07 MED ORDER — RENA-VITE PO TABS: 1.0000 | ORAL_TABLET | Freq: Every day | ORAL | 6 refills | Status: DC

## 2017-07-07 NOTE — Progress Notes (Signed)
Weekly dressing change done on 07/07/2017 @ 13.30 Lamonica Trueba RN, BSN, MSN.

## 2017-07-07 NOTE — Discharge Summary (Signed)
Discharge Summary    Patient ID: GEN CLAGG,  MRN: [id number], DOB/AGE: 1946/08/28 71 y.o.  Admit date: 06/13/2017 Discharge date: 07/07/2017  Primary Care Provider: [facility] Primary Cardiologist: 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 R > L heart failure with depressed CO c/b cardiorenal syndrome _____________   06/30/17  PROCEDURE: 1.  left brachiocephalic arteriovenous fistula placement 2.  Right internal jugular vein temporary dialysis catheter exchange for tunneled dialysis catheter   PRE-OPERATIVE DIAGNOSIS: end stage renal disease, end stage cardiac disease  POST-OPERATIVE DIAGNOSIS: same as above   SURGEON: Adele Barthel, MD History of Present Illness     71 y.o.malewith a history of CAD s/p CABG x 4 2010, OSA, AS with TAVR 2015, CKD, DM2, paroxysmal atrial fibrillation, asthma, and ischemic cardiomyopathy with Medtronic ICD.   In 5/18, creatinine was noted to increase as high as 3.67. Previously cut back on his diuretics but he became volume overloaded. I  increased diuretics again with poor response. In 5/18, he had RHC showing elevated right and left heart filling pressures and preserved cardiac output. He was admitted for IV diuresis. Weight and creatinine came down, creatinine was 2.9 when discharged. Ramp echo was done and speed was increased to 9200 rpm. Sildenafil was increased to 40 mg tid. At last visit, speed was increased to 9400 rpm and torsemide was decreased to 40 mg daily, later increased back to 60 mg daily with increased volume.   Recently, creatinine increased to 4 and torsemide was again cut back to 40 mg daily.   Pt presented earlier in the week with volume overload. Torsemide increased with weight gain of 12 lbs. He complained of Worsening SOB and peripheral edema.    He presented to clinic 06/13/17 as an add on and feels even worse, with no improvement after metolazone. He was SOB with minimal exertion and occasionally at rest. Abdomen distended and appetite decreased. Mild lightheadedness getting from his car to the clinic. He denied BRBPR/melena.   LVAD INTERROGATION:  HeartMate II LVAD:  Flow 6.6 liters/min, speed 9400, power 6.6, PI 3.7.    Pt seen by [name] and with increased volume overload and Cr at 5.7 pt was admitted for RHC and IV diuresises and inotropic support.  Rt heart cath was done on arrival and VAD speed turned to 9600.   Hospital Course     Consultants: Dr. Lorrene Reid for Nephrology   Dr. Oneida Alar for vascular.  Pt admitted  and IV lasix and IV milrinone started.  Hgb was 7.5 on admit aranesp weekly added.  Hgb on the 11th down to 6.5 and pt transfused 2 units PRBCs.   Stools were heme neg.      06/30/17 pt had  left brachiocephalic arteriovenous fistula placement and left tunneled dialysis catheter exchange.  by Dr. Bridgett Larsson.   He did have oozing at the site and then some pain but site improving.    Per [name] by problem list  Pt seen and found stable for discharge by [name].    " Acute on  chronic systolic CHF: Ischemic cardiomyopathy, EF 20-25% with RV dysfunction on 2/18 echo pre-LVAD. 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 Medtronic ICD. Overall, he has felt much better post-LVAD. However, he developed AKI and was eventually admitted in 5/18 after RHC showed elevated filling pressures. He was diuresed and creatinine came down. Since then, we have adjusted up and down his torsemide dose, with higher dose volume improves but creatinine worsens. With lower dose we can make creatinine look better but he gets volume overloaded.  He has again developed worsening volume overload with AKI, creatinine up to 5.79.  He had RHC on 8/10 as above and milrinone 0.125 started, increased to 0.25 on 8/11.  Speed increased to 9600 on 8/10.  Main issue is severe RV failure. Milrinone stopped and CVVH begun. CVVH stopped 8/17.  Now getting iHD and tolerating so far. MAPS 70s-80s.  - No ASA with h/o GI bleeding - Restarted warfarin with INR goal 2-2.5, now supratherapeutic.   - Volume management per iHD.  - Still makes urine, getting Lasix po per nephrology.    CAD: s/p CABG.  No CP.  - Continue atorvastatin. No change.    AKI on CKD stage IV: CVVH stopped 8/17. Tolerating HD so far, weight remaining stable.  Now has had fistula placed and tunneled catheter.  Fistula site pain better today, evaluated by VVS and does not appear clotted.  - Continue iHD, outpatient planning has begun. Have had LVAD nurse coordinators educate his outpatient site [facility]).  They will call him on 07/08/17 for time of appt.    Atrial fibrillation: Chronic, on warfarin.   RV failure: Continue Revatio 40 mg TID.  Now off milrinone.   HTN: Stable. Off antihypertensives.       Anemia: Baseline anemia of renal disease/chronic disease but recent bleeding from colonic AVMs. No overt bleeding, may be primarily anemia of renal disease at this point. FOBT negative.   GI bleeding: Colonic AVMs on colonoscopy 4/18 admission, APC +  clipping. - No overt bleeding. Suspect related to anemia of renal disease. FOBT negative this admission.  - Continue octreotide injections. - No ASA, warfarin INR goal 2-2.5.   Skin cancer: On neck and RLE. Needs excision, does not have to stop warfarin. Will address further as outpatient. WOC assessed biopsy sites  - No change.   Thrombocytopenia: Chronic, mild, stable. No change.     Diabetes:  - Stable. No change.   Disposition: Home today.  Will need to know what time to come for HD at [facility] (new start) on Wednesday.  The office will call him with time on WEd.  Needs followup in LVAD clinic, will need INR later this week.   office will call him tomorrow.  LVAD team aware of discharge.  Cardiac meds for home: Warfarin per pharmacy, Revatio 40 mg tid, Lasix 160 mg po bid, atorvastatin 40 daily. "  Per  Pharmacy " Warfarin instructions at D/C Warfarin 1.65m tonight (quarter tab at home) Resume warfarin 517m9/4 tomorrow He will also need to follow up in clinic or check INR from home and call in later this week TR/Fri"  Wt at discharge 182.12 lbs.  Dialyzed prior to discharge.  _____________  Discharge Vitals Blood pressure (!) 83/67, pulse 61, temperature (!) 97.2 F (36.2 C), temperature source Axillary, resp. rate 19, height 5' 5"  (1.651 m), weight 177 lb 14.6 oz (80.7 kg), SpO2 95 %.  Filed Weights   07/07/17 0411 07/07/17 0755 07/07/17 1200  Weight: 182 lb 14.4 oz (83 kg) 182 lb 12.2 oz (82.9 kg) 177 lb 14.6 oz (80.7 kg)    Labs & Radiologic Studies    CBC  Recent Labs  07/06/17 0323 07/07/17 0401  WBC 8.4 8.2  HGB 7.8* 8.2*  HCT 24.4* 25.4*  MCV 95.3 96.6  PLT 132* 14017  Basic Metabolic Panel  Recent Labs  07/04/17 1444  07/06/17 0323 07/07/17 0401  NA  --   < > 132* 132*  K  --   < > 3.7 4.6  CL  --   < > 93* 93*  CO2  --   < > 28 30  GLUCOSE  --   < > 178* 118*  BUN  --   < > 30* 39*  CREATININE  --   < > 4.33* 5.36*  CALCIUM  --   < > 8.8* 9.1  MG  1.6*  --   --   --   PHOS  --   < > 3.7 3.7  < > = values in this interval not displayed. Liver Function Tests  Recent Labs  07/06/17 0323 07/07/17 0401  ALBUMIN 3.4* 3.6   No results for input(s): LIPASE, AMYLASE in the last 72 hours. Cardiac Enzymes No results for input(s): CKTOTAL, CKMB, CKMBINDEX, TROPONINI in the last 72 hours. BNP Invalid input(s): POCBNP D-Dimer No results for input(s): DDIMER in the last 72 hours. Hemoglobin A1C No results for input(s): HGBA1C in the last 72 hours. Fasting Lipid Panel No results for input(s): CHOL, HDL, LDLCALC, TRIG, CHOLHDL, LDLDIRECT in the last 72 hours. Thyroid Function Tests No results for input(s): TSH, T4TOTAL, T3FREE, THYROIDAB in the last 72 hours.  Invalid input(s): FREET3   INR at discharge 3.14.  _____________  Dg Chest 2 View  Result Date: 06/13/2017 CLINICAL DATA:  Acute onset of shortness of breath. Heart failure. Left ventricular assist device placed in February. Initial encounter. EXAM: CHEST  2 VIEW COMPARISON:  Chest radiograph performed 03/27/2017 FINDINGS: The lungs are well-aerated. Mild vascular congestion is noted. There is no evidence of pleural effusion or pneumothorax. The heart is borderline enlarged. The patient is status post median sternotomy. An aortic valve replacement is noted. A left ventricular assist device is seen. An AICD is noted at the right chest wall, with a single lead ending at the right ventricle. No acute osseous abnormalities are seen. IMPRESSION: Mild vascular congestion and borderline cardiomegaly. Lungs remain relatively clear. Electronically Signed   By: JeGarald Balding.D.   On: 06/13/2017 21:40   Ir Fluoro Guide Cv Line Left  Result Date: 06/17/2017 INDICATION: In need of temporary intravenous access for the initiation of dialysis. EXAM: NON-TUNNELED CENTRAL VENOUS HEMODIALYSIS CATHETER PLACEMENT WITH ULTRASOUND AND FLUOROSCOPIC GUIDANCE COMPARISON:  Chest radiograph - 06/13/2017  MEDICATIONS: None FLUOROSCOPY TIME:  48 seconds (25  mGy) COMPLICATIONS: None immediate. PROCEDURE: Informed written consent was obtained from the  patient after a discussion of the risks, benefits, and alternatives to treatment. Questions regarding the procedure were encouraged and answered. Given the presence of the right anterior chest wall AICD/pacemaker, the decision was made to place a left internal jugular approach temporary dialysis catheter. As such, the left neck and chest were prepped with chlorhexidine in a sterile fashion, and a sterile drape was applied covering the operative field. Maximum barrier sterile technique with sterile gowns and gloves were used for the procedure. A timeout was performed prior to the initiation of the procedure. After the overlying soft tissues were anesthetized, a small venotomy incision was created and a micropuncture kit was utilized to access the internal jugular vein. Real-time ultrasound guidance was utilized for vascular access including the acquisition of a permanent ultrasound image documenting patency of the accessed vessel. The microwire was utilized to measure appropriate catheter length. A stiff glidewire was advanced to the level of the IVC. Under fluoroscopic guidance, the venotomy was serially dilated, ultimately allowing placement of a 20 cm temporary Mahurkar catheter with tip ultimately terminating within the superior aspect of the right atrium. Final catheter positioning was confirmed and documented with a spot radiographic image. The catheter aspirates and flushes normally. The catheter was flushed with appropriate volume heparin dwells. The catheter exit site was secured with a 0-Prolene retention suture. A dressing was placed. The patient tolerated the procedure well without immediate post procedural complication. IMPRESSION: Successful placement of a left internal jugular approach 20 cm temporary dialysis catheter with tip terminating with in the  superior aspect of the right atrium. The catheter is ready for immediate use. PLAN: This catheter may be converted to a tunneled dialysis catheter at a later date as indicated. Electronically Signed   By: Sandi Mariscal M.D.   On: 06/17/2017 15:21   Ir US Guide Vasc Access Left  Result Date: 06/17/2017 INDICATION: In need of temporary intravenous access for the initiation of dialysis. EXAM: NON-TUNNELED CENTRAL VENOUS HEMODIALYSIS CATHETER PLACEMENT WITH ULTRASOUND AND FLUOROSCOPIC GUIDANCE COMPARISON:  Chest radiograph - 06/13/2017 MEDICATIONS: None FLUOROSCOPY TIME:  48 seconds (25  mGy) COMPLICATIONS: None immediate. PROCEDURE: Informed written consent was obtained from the patient after a discussion of the risks, benefits, and alternatives to treatment. Questions regarding the procedure were encouraged and answered. Given the presence of the right anterior chest wall AICD/pacemaker, the decision was made to place a left internal jugular approach temporary dialysis catheter. As such, the left neck and chest were prepped with chlorhexidine in a sterile fashion, and a sterile drape was applied covering the operative field. Maximum barrier sterile technique with sterile gowns and gloves were used for the procedure. A timeout was performed prior to the initiation of the procedure. After the overlying soft tissues were anesthetized, a small venotomy incision was created and a micropuncture kit was utilized to access the internal jugular vein. Real-time ultrasound guidance was utilized for vascular access including the acquisition of a permanent ultrasound image documenting patency of the accessed vessel. The microwire was utilized to measure appropriate catheter length. A stiff glidewire was advanced to the level of the IVC. Under fluoroscopic guidance, the venotomy was serially dilated, ultimately allowing placement of a 20 cm temporary Mahurkar catheter with tip ultimately terminating within the superior aspect of  the right atrium. Final catheter positioning was confirmed and documented with a spot radiographic image. The catheter aspirates and flushes normally. The catheter was flushed with appropriate volume heparin dwells. The catheter exit site was secured with a 0-Prolene  retention suture. A dressing was placed. The patient tolerated the procedure well without immediate post procedural complication. IMPRESSION: Successful placement of a left internal jugular approach 20 cm temporary dialysis catheter with tip terminating with in the superior aspect of the right atrium. The catheter is ready for immediate use. PLAN: This catheter may be converted to a tunneled dialysis catheter at a later date as indicated. Electronically Signed   By: Sandi Mariscal M.D.   On: 06/17/2017 15:21   Dg Chest Port 1v Same Day  Result Date: 06/30/2017 CLINICAL DATA:  71 year old male status post left dialysis catheter placement. EXAM: PORTABLE CHEST 1 VIEW COMPARISON:  06/13/2017 FINDINGS: The left ventricular assist device and right-sided single lead PA CT appearance stable position. The patient is status post median sternotomy and aortic valve replacement. There has been interval placement of AE left subclavian dialysis catheter terminating in the right atrium. The cardiomediastinal silhouette is enlarged but stable. There is mild vascular congestion without frank edema. The lungs are clear. No pleural effusion or pneumothorax. No acute osseous abnormalities. IMPRESSION: 1. Interval placement of a left subclavian dialysis catheter terminating at the right atrium. 2. Otherwise stable examination of the chest. Electronically Signed   By: Kristopher Oppenheim M.D.   On: 06/30/2017 10:29   Dg Fluoro Guide Cv Line-no Report  Result Date: 06/30/2017 Fluoroscopy was utilized by the requesting physician.  No radiographic interpretation.   Disposition   Pt is being discharged home today in good condition.  Follow-up Plans & Appointments     Follow-up Information    Conrad Lawn, MD Follow up in 6 week(s).   Specialties:  Vascular Surgery, Cardiology Why:  Our office will call you to arrange an appointment  Contact information: [place] [address] [phone]        Bensimhon, Shaune Pascal, MD Follow up.   Specialty:  Cardiology Why:  the office will call with date and time, you will need INR for coumadin checked on the 5th  Contact information: [place] [address] [phone]         LVAD as before  Only take 2.5 mg coumadin tonight a 4th of tab.  Then 5 mg on Tuesday.    Heart Healthy low salt renal diet.    1200 cc fluid restriction  Weigh daily call if wt increases by 3 lbs in a day or 5 lbs in a week.  dialysis will be following as well.     Monitor your glucose, here you were on lower dose of insulin.  But also sliding scale.     Discharge Medications   Current Discharge Medication List    START taking these medications   Details  calcitRIOL (ROCALTROL) 0.5 MCG capsule Take 1 capsule (0.5 mcg total) by mouth every other day. Qty: 15 capsule, Refills: 6    furosemide (LASIX) 80 MG tablet Take 2 tablets (160 mg total) by mouth 2 (two) times daily. Qty: 120 tablet, Refills: 6    multivitamin (RENA-VIT) TABS tablet Take 1 tablet by mouth at bedtime. Qty: 30 tablet, Refills: 6    sevelamer carbonate (RENVELA) 2.4 g PACK Take 2.4 g by mouth 3 (three) times daily with meals. Qty: 90 each, Refills: 1      CONTINUE these medications which have CHANGED   Details  gabapentin (NEURONTIN) 600 MG tablet Take 0.5 tablets (300 mg total) by mouth at bedtime.    warfarin (COUMADIN) 5 MG tablet Take 1  tablet (5 mg total) by mouth daily. Tonight 07/07/17 only take 1.25 mg a quarter of a tablet.  Then 5 mg on 07/08/17 have INR checked on Wed. Qty: 30 tablet, Refills: 11      CONTINUE these medications which have NOT CHANGED   Details  albuterol  (PROVENTIL HFA;VENTOLIN HFA) 108 (90 Base) MCG/ACT inhaler Inhale 2 puffs into the lungs every 4 (four) hours as needed for wheezing or shortness of breath. Qty: 1 Inhaler, Refills: 5    albuterol (PROVENTIL) (2.5 MG/3ML) 0.083% nebulizer solution Take 3 mLs (2.5 mg total) by nebulization every 4 (four) hours. And as needed Qty: 150 mL, Refills: 5    atorvastatin (LIPITOR) 40 MG tablet Take 1 tablet (40 mg total) by mouth daily. Qty: 30 tablet, Refills: 6    busPIRone (BUSPAR) 5 MG tablet Take 1 tablet (5 mg total) by mouth 2 (two) times daily. Qty: 60 tablet, Refills: 6    docusate sodium (COLACE) 100 MG capsule Take 1 capsule (100 mg total) by mouth 2 (two) times daily. Qty: 60 capsule, Refills: 12    fluticasone (FLOVENT HFA) 110 MCG/ACT inhaler Inhale 2 puffs into the lungs 2 (two) times daily. Qty: 1 Inhaler, Refills: 5    insulin glargine (LANTUS) 100 unit/mL SOPN Inject 0.2 mLs (20 Units total) into the skin at bedtime. Qty: 15 mL, Refills: 0   Associated Diagnoses: Type 2 diabetes mellitus without complication, with long-term current use of insulin (HCC)    levothyroxine (SYNTHROID, LEVOTHROID) 25 MCG tablet Take 1 tablet (25 mcg total) by mouth daily before breakfast. Qty: 30 tablet, Refills: 6    pantoprazole (PROTONIX) 40 MG tablet Take 1 tablet (40 mg total) by mouth daily at 12 noon. Qty: 30 tablet, Refills: 6    sildenafil (REVATIO) 20 MG tablet Take 2 tablets (40 mg total) by mouth 3 (three) times daily. Qty: 180 tablet, Refills: 6    traMADol (ULTRAM) 50 MG tablet TAKE 1 TABLET BY MOUTH EVERY 6 HOURS AS NEEDED FOR MODERATE PAIN Qty: 30 tablet, Refills: 0    traZODone (DESYREL) 50 MG tablet TAKE 1 TABLET(50 MG) BY MOUTH AT BEDTIME AS NEEDED FOR SLEEP Qty: 30 tablet, Refills: 0      STOP taking these medications     ascorbic acid (VITAMIN C) 1000 MG tablet      metolazone (ZAROXOLYN) 2.5 MG tablet      Multiple Vitamin (MULTIVITAMIN WITH MINERALS) TABS tablet       potassium chloride SA (K-DUR,KLOR-CON) 20 MEQ tablet      torsemide (DEMADEX) 20 MG tablet      amLODipine (NORVASC) 5 MG tablet      benzonatate (TESSALON) 100 MG capsule            Outstanding Labs/Studies     Duration of Discharge Encounter   Greater than 30 minutes including physician time.  Signed, Cecilie Kicks NP 07/07/2017, 1:21 PM

## 2017-07-07 NOTE — Progress Notes (Addendum)
ANTICOAGULATION CONSULT NOTE - Follow Up Consult  Pharmacy Consult for Heparin and Coumadin Indication: LVAD  No Known Allergies  Patient Measurements: Height: 5\' 5"  (165.1 cm) Weight: 182 lb 14.4 oz (83 kg) IBW/kg (Calculated) : 61.5 Heparin Dosing Weight: 80.7kg  Vital Signs: Temp: 98.3 F (36.8 C) (09/03 0411) Temp Source: Oral (09/03 0411) BP: 88/78 (09/03 0411)  Labs:  Recent Labs  07/05/17 0309 07/06/17 0323 07/07/17 0401  HGB 8.2* 7.8* 8.2*  HCT 25.8* 24.4* 25.4*  PLT 115* 132* 143*  LABPROT 19.1* 24.1* 32.0*  INR 1.62 2.18 3.14  HEPARINUNFRC 0.30 0.30  --   CREATININE 3.03* 4.33* 5.36*    Estimated Creatinine Clearance: 12.5 mL/min (A) (by C-G formula based on SCr of 5.36 mg/dL (H)).   Assessment: 70yom with LVAD resumed on heparin and coumadin 8/27 after left AV fistula and right TDC placement.   Heparin turned off yesterday. INR continues to trend up to 3.1. Hgb/platelets low but stable. LDH ok. No further bleeding at [facility] site.   Mr. Haran appears to follow a very consistent but delayed INR trend, seeing effects of warfarin doses 2-3 days after each dose. Will instruct him to quarter a tablet tonight giving him 1.25mg  tonight then resume his 5mg  daily tomorrow night.  PTA dose: 5mg  daily except 7.5mg  on Tuesday  Goal of Therapy:  INR 2-2.5 Monitor platelets by anticoagulation protocol: Yes   Plan:  Warfarin instructions at D/C  Warfarin 1.25mg  tonight (quarter tab at home) Resume warfarin 5mg  9/4 tomorrow He will also need to follow up in clinic or check INR from home and call in later this week TR/Fri  Erin Hearing PharmD., BCPS Clinical Pharmacist Pager [phone] 07/07/2017 8:23 AM

## 2017-07-07 NOTE — Procedures (Signed)
Stable hemodynamics on HD.  Arrangements in place for more LVAD training at [facility] in AM and he will go to center with a nurse educator for the first treatment. Trevor [name] C

## 2017-07-07 NOTE — Care Management Note (Signed)
Case Management Note  Patient Details  Name: Trevor [name] MRN: [id number] Date of Birth: [birth year]  Subjective/Objective:                    Action/Plan: Pt is discharging home with his sister. Pt has f/u appointments and Cardiology office will follow up with him tomorrow about HD on Wednesday. Per d/c summary LVAD team aware of his d/c today. Pt has transportation home.   Expected Discharge Date:  07/07/17               Expected Discharge Plan:  [place]  In-House Referral:     Discharge planning Services  CM Consult  Post Acute Care Choice:    Choice offered to:     DME Arranged:    DME Agency:     HH Arranged:    [place] Agency:     Status of Service:  Completed, signed off  If discussed at [employer], dates discussed:    Additional Comments:  Pollie Friar, RN 07/07/2017, 2:52 PM

## 2017-07-07 NOTE — Discharge Instructions (Signed)
LVAD as before  Only take 2.5 mg coumadin tonight a 4th of tab.  Then 5 mg on Tuesday.    Heart Healthy diabetic low salt renal diet.    1200 cc fluid restriction  Weigh daily call if wt increases by 3 lbs in a day or 5 lbs in a week.  dialysis will be following as well.   Monitor your glucose, here you were on lower dose of insulin.  But also sliding scale.

## 2017-07-07 NOTE — Progress Notes (Signed)
Patient ID: Trevor [name], male   DOB: 1946/07/26, 71 y.o.   MRN: [id number]    Advanced Heart Failure VAD Team Note  Subjective:    Started on CVVHD 06/17/17 via temporary cath.   CVVHD stopped 8/17.   Tunneled HD catheter and AV fistula placed 8/27. Warfarin restarted. INR 3.1 today.   Pain at left arm fistula site feeling better.   MAP 80s.   Dr Donzetta Matters assessed fistula , not clotted.   Seen at HD today, no complaints.   Studies: RHC (06/13/17):  RA = 17 RV = 58/18 PA = 56/25 (37) PCW = 20 (v = 35) Fick cardiac output/index = 6.4/3.2 Thermo CO/CI = 4.1/2.1 PVR = 2.6 Ao sat = 98% PA sat = 56%, 60% Increased LVAD speed to 9600   PCW down to 19  LVAD INTERROGATION:  HeartMate II LVAD:  Flow 6 liters/min, speed 9600 power 5.8, PI 3.  5 PI events last 24 hrs.   Objective:    Vital Signs:   Temp:  [98.3 F (36.8 C)-98.8 F (37.1 C)] 98.3 F (36.8 C) (09/03 0411) Pulse Rate:  [50-77] 76 (09/02 2005) Resp:  [9-17] 11 (09/03 0411) BP: (88-116)/(68-86) 88/78 (09/03 0411) SpO2:  [97 %-100 %] 97 % (09/03 0411) Weight:  [182 lb 14.4 oz (83 kg)] 182 lb 14.4 oz (83 kg) (09/03 0411) Last BM Date: 07/05/17 Mean arterial Pressure 70s-80s  Intake/Output:   Intake/Output Summary (Last 24 hours) at 07/07/17 0919 Last data filed at 07/07/17 0731  Gross per 24 hour  Intake              720 ml  Output             1475 ml  Net             -755 ml     Physical Exam    Physical Exam: GENERAL: NAD HEENT: normal  NECK: Supple, JVP 9-10. No lymphadenopathy or thyromegaly appreciated.  CARDIAC: Irregular.LVAD hum present.  LUNGS: coarse throughout.  ABDOMEN: Soft, round, nontender, positive bowel sounds x4.  LVAD exit site: well-healed and incorporated. Dressing dry and intact. No erythema or drainage. Stabilization device present and accurately applied. Driveline dressing is being changed daily per sterile technique. EXTREMITIES: Warm and dry, no cyanosis,  clubbing, rash. 1+ edema right ankle.   NEUROLOGIC: Alert and oriented x 4. Gait steady. No aphasia. No dysarthria. Affect pleasant.   Telemetry   Personally reviewed A fib 80s   Labs   Basic Metabolic Panel:  Recent Labs Lab 07/03/17 0453 07/04/17 0148 07/04/17 1444 07/05/17 0309 07/06/17 0323 07/07/17 0401  NA 133* 132*  --  131* 132* 132*  K 4.0 4.1  --  3.7 3.7 4.6  CL 97* 92*  --  94* 93* 93*  CO2 27 29  --  27 28 30   GLUCOSE 139* 163*  --  148* 178* 118*  BUN 19 31*  --  16 30* 39*  CREATININE 2.98* 4.13*  --  3.03* 4.33* 5.36*  CALCIUM 8.8* 9.1  --  8.9 8.8* 9.1  MG  --   --  1.6*  --   --   --   PHOS 3.8 4.6  --  3.4 3.7 3.7    Liver Function Tests:  Recent Labs Lab 07/03/17 0453 07/04/17 0148 07/05/17 0309 07/06/17 0323 07/07/17 0401  ALBUMIN 3.3* 3.4* 3.4* 3.4* 3.6   No results for input(s): LIPASE, AMYLASE in the last 168 hours. No results  for input(s): AMMONIA in the last 168 hours.  CBC:  Recent Labs Lab 07/03/17 0453 07/04/17 0148 07/05/17 0309 07/06/17 0323 07/07/17 0401  WBC 7.5 8.0 7.0 8.4 8.2  HGB 8.1* 8.2* 8.2* 7.8* 8.2*  HCT 25.5* 25.6* 25.8* 24.4* 25.4*  MCV 99.2 96.6 95.9 95.3 96.6  PLT 94* 113* 115* 132* 143*    INR:  Recent Labs Lab 07/03/17 0453 07/04/17 0148 07/05/17 0309 07/06/17 0323 07/07/17 0401  INR 1.21 1.26 1.62 2.18 3.14    Other results:  EKG:    Imaging   No results found.  Medications:     Scheduled Medications: . atorvastatin  40 mg Oral Daily  . budesonide  0.25 mg Nebulization BID  . busPIRone  5 mg Oral BID  . calcitRIOL  0.5 mcg Oral QODAY  . Chlorhexidine Gluconate Cloth  6 each Topical Daily  . darbepoetin (ARANESP) injection - NON-DIALYSIS  150 mcg Subcutaneous Q Fri-1800  . docusate sodium  100 mg Oral BID  . furosemide  160 mg Oral BID  . gabapentin  300 mg Oral QHS  . insulin aspart  0-15 Units Subcutaneous TID WC  . insulin aspart  0-5 Units Subcutaneous QHS  .  insulin glargine  17 Units Subcutaneous QHS  . levothyroxine  25 mcg Oral QAC breakfast  . multivitamin  1 tablet Oral QHS  . neomycin-bacitracin-polymyxin   Topical Daily  . pantoprazole  40 mg Oral Q1200  . rOPINIRole  0.25 mg Oral QHS  . sevelamer carbonate  2.4 g Oral TID WC  . sildenafil  40 mg Oral TID  . sodium chloride flush  10-40 mL Intracatheter Q12H  . Warfarin - Pharmacist Dosing Inpatient   Does not apply q1800    Infusions: . sodium chloride    . sodium chloride    . sodium chloride      PRN Medications: sodium chloride, sodium chloride, acetaminophen, albuterol, alteplase, heparin, heparin, lidocaine (PF), lidocaine-prilocaine, ondansetron (ZOFRAN) IV, oxyCODONE, pentafluoroprop-tetrafluoroeth, sodium chloride flush, traMADol, traZODone   Patient Profile   Trevor Luzier Murphyis a 71 y.o.malewith a history of CAD s/p CABG x 4 2010, OSA, AS with TAVR 2015, CKD, DM2, paroxysmal atrial fibrillation, asthma, and ischemic cardiomyopathy with Medtronic ICD.   He admitted with marked volume overload and AKI despite with increase in outpatient diuretics.    Assessment/Plan:    1. Acute on chronic systolic CHF: Ischemic cardiomyopathy, EF 20-25% with RV dysfunction on 2/18 echo pre-LVAD. 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 Medtronic ICD. Overall, he has felt much better post-LVAD. However, he developed AKI and was eventually admitted in 5/18 after RHC showed elevated filling pressures. He was diuresed and creatinine came down. Since then, we have adjusted up and down his torsemide dose, with higher dose volume improves but creatinine worsens. With lower dose we can make creatinine look better but he gets volume overloaded.  He has again developed worsening volume overload with AKI, creatinine up to 5.79.  He had RHC on 8/10 as above and milrinone 0.125 started, increased to 0.25 on 8/11.  Speed increased to 9600 on 8/10.  Main issue is severe RV failure. Milrinone stopped and  CVVH begun. CVVH stopped 8/17.  Now getting iHD and tolerating so far. MAPS 70s-80s.  - No ASA with h/o GI bleeding - Restarted warfarin with INR goal 2-2.5, now supratherapeutic.   - Volume management per iHD.  - Still makes urine, getting Lasix po per nephrology.  2. CAD: s/p CABG.  No CP.  -  Continue atorvastatin. No change.  3. AKI on CKD stage IV: CVVH stopped 8/17. Tolerating HD so far, weight remaining stable.  Now has had fistula placed and tunneled catheter.  Fistula site pain better today, evaluated by VVS and does not appear clotted.  - Continue iHD, outpatient planning has begun. Have had LVAD nurse coordinators educate his outpatient site [facility]).  4. Atrial fibrillation: Chronic, on warfarin.  5. RV failure: Continue Revatio 40 mg TID.  Now off milrinone.  6. HTN: Stable. Off antihypertensives.      7. Anemia: Baseline anemia of renal disease/chronic disease but recent bleeding from colonic AVMs. No overt bleeding, may be primarily anemia of renal disease at this point. FOBT negative.  8. 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 - No overt bleeding. Suspect related to anemia of renal disease. FOBT negative this admission.  - Continue octreotide injections. - No ASA, warfarin INR goal 2-2.5.  9. Skin cancer: On neck and RLE. Needs excision, does not have to stop warfarin. Will address further as outpatient. WOC assessed biopsy sites  - No change.  10. Thrombocytopenia: Chronic, mild, stable. No change.    11. Diabetes:  - Stable. No change.  12. Disposition: Home today.  Will need to know what time to come for HD at [facility] (new start) on Wednesday.  Needs followup in LVAD clinic, will need INR later this week.  Cardiac meds for home: Warfarin per pharmacy, Revatio 40 mg tid, Lasix 160 mg po bid, atorvastatin 80 daily.    I reviewed the LVAD parameters from today, and compared the results to the patient's prior recorded data.  No programming  changes were made.  The LVAD is functioning within specified parameters.  The patient performs LVAD self-test daily.  LVAD interrogation was negative for any significant power changes, alarms or PI events/speed drops.  LVAD equipment check completed and is in good working order.  Back-up equipment present.   LVAD education done on emergency procedures and precautions and reviewed exit site care.  Length of Stay: [place], MD 07/07/2017, 9:19 AM  VAD Team --- VAD ISSUES ONLY--- Pager [phone] (7am - 7am)  Advanced Heart Failure Team  Pager [phone] (M-F; 7a - 4p)  Please contact [facility] for night-coverage after hours (4p -7a ) and weekends on [email or web address]

## 2017-07-08 ENCOUNTER — Encounter (HOSPITAL_COMMUNITY): Payer: Medicare Other

## 2017-07-08 ENCOUNTER — Other Ambulatory Visit (HOSPITAL_COMMUNITY): Payer: Self-pay | Admitting: *Deleted

## 2017-07-08 ENCOUNTER — Telehealth: Payer: Self-pay | Admitting: Unknown Physician Specialty

## 2017-07-08 ENCOUNTER — Other Ambulatory Visit (HOSPITAL_COMMUNITY): Payer: Self-pay | Admitting: Internal Medicine

## 2017-07-08 ENCOUNTER — Other Ambulatory Visit: Payer: Self-pay | Admitting: Pharmacist

## 2017-07-08 MED ORDER — CALCITRIOL 0.5 MCG PO CAPS: 0.5000 ug | ORAL_CAPSULE | ORAL | 6 refills | Status: DC

## 2017-07-08 MED ORDER — SEVELAMER CARBONATE 2.4 G PO PACK: 2.4000 g | PACK | Freq: Three times a day (TID) | ORAL | 1 refills | Status: DC

## 2017-07-08 MED ORDER — TRAZODONE HCL 50 MG PO TABS: ORAL_TABLET | ORAL | 0 refills | Status: DC

## 2017-07-08 MED ORDER — TRAMADOL HCL 50 MG PO TABS: ORAL_TABLET | ORAL | 0 refills | Status: DC

## 2017-07-08 NOTE — Addendum Note (Signed)
Encounter addended by: Sol Passer on: 07/08/2017  1:48 PM<BR>    Actions taken: Visit Navigator Flowsheet section accepted

## 2017-07-08 NOTE — Telephone Encounter (Signed)
Called to inform pt that we have educated dialysis center regarding his heart pump and that the staff has had a willingness to learn and is excited to meet him tomorrow. A doppler was loaned to the center if the need arises to doppler his BP. Pt was informed that a coordinator will be at the dialysis center tomorrow for his first treatment. All the pts questions were answered.

## 2017-07-09 ENCOUNTER — Encounter (HOSPITAL_COMMUNITY): Payer: Medicare Other

## 2017-07-10 ENCOUNTER — Ambulatory Visit (HOSPITAL_COMMUNITY): Payer: Self-pay | Admitting: Pharmacist

## 2017-07-10 ENCOUNTER — Telehealth (HOSPITAL_COMMUNITY): Payer: Self-pay

## 2017-07-10 ENCOUNTER — Ambulatory Visit (HOSPITAL_COMMUNITY): Payer: Self-pay | Admitting: *Deleted

## 2017-07-10 DIAGNOSIS — Z95811 Presence of heart assist device: Secondary | ICD-10-CM

## 2017-07-10 LAB — POCT INR
INR: 1.3
INR: 1.3

## 2017-07-10 MED ORDER — ENOXAPARIN SODIUM 40 MG/0.4ML ~~LOC~~ SOLN: 40.0000 mg | Freq: Two times a day (BID) | SUBCUTANEOUS | 1 refills | Status: DC

## 2017-07-10 NOTE — Telephone Encounter (Signed)
MD INR called to report out of range INR for LVAD patient.  1.3 collected today 07/10/17  Ileene Patrick CHF clinical pharmD made aware.  Renee Pain, RN

## 2017-07-10 NOTE — Patient Instructions (Signed)
Opened in error

## 2017-07-11 NOTE — Addendum Note (Signed)
Encounter addended by: Jewel Baize, RD on: 07/11/2017 11:00 AM<BR>    Actions taken: Flowsheet data copied forward, Visit Navigator Flowsheet section accepted

## 2017-07-13 ENCOUNTER — Ambulatory Visit (HOSPITAL_COMMUNITY): Payer: Self-pay | Admitting: Pharmacist

## 2017-07-13 DIAGNOSIS — Z95811 Presence of heart assist device: Secondary | ICD-10-CM

## 2017-07-13 LAB — POCT INR: INR: 1.3

## 2017-07-14 ENCOUNTER — Other Ambulatory Visit (HOSPITAL_COMMUNITY): Payer: Self-pay | Admitting: Unknown Physician Specialty

## 2017-07-14 ENCOUNTER — Other Ambulatory Visit (HOSPITAL_COMMUNITY): Payer: Self-pay | Admitting: *Deleted

## 2017-07-14 DIAGNOSIS — Z7901 Long term (current) use of anticoagulants: Secondary | ICD-10-CM

## 2017-07-14 DIAGNOSIS — Z95811 Presence of heart assist device: Secondary | ICD-10-CM

## 2017-07-15 ENCOUNTER — Ambulatory Visit (HOSPITAL_COMMUNITY): Payer: Self-pay | Admitting: Pharmacist

## 2017-07-15 ENCOUNTER — Encounter (HOSPITAL_COMMUNITY)
Admission: RE | Admit: 2017-07-15 | Discharge: 2017-07-15 | Disposition: A | Discharge status: Home / Self Care | Payer: Medicare Other | Source: Ambulatory Visit | Attending: Cardiology | Admitting: Cardiology

## 2017-07-15 ENCOUNTER — Ambulatory Visit (HOSPITAL_COMMUNITY)
Admission: RE | Admit: 2017-07-15 | Discharge: 2017-07-15 | Disposition: A | Discharge status: Home / Self Care | Payer: Medicare Other | Source: Ambulatory Visit | Attending: Cardiology | Admitting: Cardiology

## 2017-07-15 VITALS — BP 110/47 | HR 75 | Ht 65.0 in | Wt 178.6 lb

## 2017-07-15 DIAGNOSIS — D638 Anemia in other chronic diseases classified elsewhere: Secondary | ICD-10-CM | POA: Insufficient documentation

## 2017-07-15 DIAGNOSIS — Z951 Presence of aortocoronary bypass graft: Secondary | ICD-10-CM | POA: Insufficient documentation

## 2017-07-15 DIAGNOSIS — G4733 Obstructive sleep apnea (adult) (pediatric): Secondary | ICD-10-CM | POA: Insufficient documentation

## 2017-07-15 DIAGNOSIS — I2510 Atherosclerotic heart disease of native coronary artery without angina pectoris: Secondary | ICD-10-CM | POA: Diagnosis not present

## 2017-07-15 DIAGNOSIS — I5022 Chronic systolic (congestive) heart failure: Secondary | ICD-10-CM | POA: Insufficient documentation

## 2017-07-15 DIAGNOSIS — Z794 Long term (current) use of insulin: Secondary | ICD-10-CM | POA: Diagnosis not present

## 2017-07-15 DIAGNOSIS — K922 Gastrointestinal hemorrhage, unspecified: Secondary | ICD-10-CM | POA: Insufficient documentation

## 2017-07-15 DIAGNOSIS — N186 End stage renal disease: Secondary | ICD-10-CM | POA: Insufficient documentation

## 2017-07-15 DIAGNOSIS — Z95811 Presence of heart assist device: Secondary | ICD-10-CM | POA: Diagnosis present

## 2017-07-15 DIAGNOSIS — I255 Ischemic cardiomyopathy: Secondary | ICD-10-CM | POA: Diagnosis not present

## 2017-07-15 DIAGNOSIS — I482 Chronic atrial fibrillation: Secondary | ICD-10-CM | POA: Diagnosis not present

## 2017-07-15 DIAGNOSIS — I50810 Right heart failure, unspecified: Secondary | ICD-10-CM

## 2017-07-15 DIAGNOSIS — E1122 Type 2 diabetes mellitus with diabetic chronic kidney disease: Secondary | ICD-10-CM | POA: Diagnosis not present

## 2017-07-15 DIAGNOSIS — C4490 Unspecified malignant neoplasm of skin, unspecified: Secondary | ICD-10-CM | POA: Insufficient documentation

## 2017-07-15 DIAGNOSIS — Z7901 Long term (current) use of anticoagulants: Secondary | ICD-10-CM | POA: Insufficient documentation

## 2017-07-15 DIAGNOSIS — I132 Hypertensive heart and chronic kidney disease with heart failure and with stage 5 chronic kidney disease, or end stage renal disease: Secondary | ICD-10-CM | POA: Insufficient documentation

## 2017-07-15 DIAGNOSIS — I509 Heart failure, unspecified: Secondary | ICD-10-CM

## 2017-07-15 DIAGNOSIS — Z79899 Other long term (current) drug therapy: Secondary | ICD-10-CM | POA: Insufficient documentation

## 2017-07-15 DIAGNOSIS — Z79891 Long term (current) use of opiate analgesic: Secondary | ICD-10-CM | POA: Diagnosis not present

## 2017-07-15 LAB — LACTATE DEHYDROGENASE: LDH: 236 U/L — AB (ref 98–192)

## 2017-07-15 LAB — PROTIME-INR
INR: 1.36
Prothrombin Time: 16.6 seconds — ABNORMAL HIGH (ref 11.4–15.2)

## 2017-07-15 LAB — CBC
HEMATOCRIT: 28.7 % — AB (ref 39.0–52.0)
Hemoglobin: 9 g/dL — ABNORMAL LOW (ref 13.0–17.0)
MCH: 30.3 pg (ref 26.0–34.0)
MCHC: 31.4 g/dL (ref 30.0–36.0)
MCV: 96.6 fL (ref 78.0–100.0)
Platelets: 182 10*3/uL (ref 150–400)
RBC: 2.97 MIL/uL — ABNORMAL LOW (ref 4.22–5.81)
RDW: 17.3 % — AB (ref 11.5–15.5)
WBC: 8.3 10*3/uL (ref 4.0–10.5)

## 2017-07-15 LAB — BASIC METABOLIC PANEL
Anion gap: 9 (ref 5–15)
BUN: 27 mg/dL — AB (ref 6–20)
CHLORIDE: 95 mmol/L — AB (ref 101–111)
CO2: 31 mmol/L (ref 22–32)
Calcium: 8.8 mg/dL — ABNORMAL LOW (ref 8.9–10.3)
Creatinine, Ser: 4.43 mg/dL — ABNORMAL HIGH (ref 0.61–1.24)
GFR calc Af Amer: 14 mL/min — ABNORMAL LOW (ref >60)
GFR calc non Af Amer: 12 mL/min — ABNORMAL LOW (ref >60)
Glucose, Bld: 195 mg/dL — ABNORMAL HIGH (ref 65–99)
POTASSIUM: 4.3 mmol/L (ref 3.5–5.1)
SODIUM: 135 mmol/L (ref 135–145)

## 2017-07-15 MED ORDER — OCTREOTIDE ACETATE 20 MG IM KIT
10.0000 mg | PACK | INTRAMUSCULAR | Status: DC
  Administered 2017-07-15: 11:00:00 10 mg via INTRAMUSCULAR
  Filled 2017-07-15: qty 1

## 2017-07-15 NOTE — Progress Notes (Addendum)
Patient presents for sick visit in [facility] today. Reports no problems with VAD equipment or concerns with drive line. Pt has been having dialysis at the kidney center in [place] and reports no problems.   Vital Signs:  Doppler Pressure 84 Automatc BP: 110/47 (66) HR: 75 SPO2: 97%  Weight: 178.6 lb w/o eqt D/c weight: 182.1 lb Home weights: 180 lbs  VAD Indication: DT- age excluding    VAD interrogation & Equipment Management (reviewed with [name]): Speed: 9600 Flow: 5.0 Power: 5.9 w    PI: 5.2  Alarms: none Events: rare  Fixed speed 9600 Low speed limit: 9000  Primary controller battery expiration:  15 mos Secondary controller battery expiration:  15 mos  Exit Site Care: Drive line is being Transport planner by VAD Coordinators. Sorbaview dressing removed and site care performed using sterile technique. Drive line exit site cleaned with Chlora prep applicators x 2, rinsed with saline, allowed to dry, and sensitive skin dressing with biopatch disc applied. Exit site healed and incorporated, the velour is fully implanted at exit site. Redness noted around exit site, chronic in nature. Psoriasis rash noted around dressing site and under centurion foley attachment device. Pt reports the anchor is not staying on, "doesn't stick well".  Drive line anchor re-applied. Pt denies fever or chills.   Significant Events on VAD Support:  02/2017> GIB- AVM clipped x2  Device:Medtronic single lead Therapies: on at 231 bpm Last check: today  BP & Labs:  Doppler BP 84 - Doppler is reflecting map  Hgb 9.0 - No S/S of bleeding. Specifically denies melena/BRBPR. We will recheck this on Thursday.   LDH stable at 236 with established baseline of 225- 300. Denies tea-colored urine. No power elevations noted on interrogation.    Patient Instructions:  1. Stop Lasix.  2. Return to [facility] in 1 week for dressing change. 3. Return to [facility] for full visit in 1  month.  [name] VAD Coordinator   Office: [phone] 24/7 Emergency VAD Pager: [phone]    Trevor Groman Murphyis a 71 y.o.malewith a history of CAD s/p CABG x 4 2010, OSA, AS with TAVR 2015, CKD, DM2, paroxysmal atrial fibrillation, asthma, and ischemic cardiomyopathy with Medtronic ICD.   Relocated to [place] from [place] in early 2018. Over the last couple of years, he had been admitted multiple times for CHF. Last admission in [place] was in 1/18. Sounds like the hospitalization was complicated by cardiorenal syndrome and there was consideration of doing dialysis.  He was admitted in to [facility] 11/27/16 with NYHA class IV symptoms and hypotension. He was placed on dual inotropes to facilitate diuresis, cardiac output and renal function.  CT surgery consulted for mechanical support and he was deemed appropriate for LVAD work up. He completed LVAD work up and was approved for HMII LVAD --> DT. On 12/11/16, he had IABP placed to optimize cardiac output and improve renal function. He then underwent HMII LVAD placement + tricuspid valve repair on 12/14/2015. Post operatively pressors weaned off slowly. He required significant diuresis.  Course was complicated by RV dysfunction and renal dysfunction.  Patient was admitted with lower GI bleeding in 4/18.  He had been on ASA 325 (increased with mild LDH elevation) and warfarin INR 2-2.5.  He had 4 units PRBCs total.  Colonoscopy showed colonic AVM treated with APC and clipping.  He was sent home off ASA and on warfarin INR goal 2-2.5.   In 5/18, creatinine was  noted to increase as high as 3.67.  I cut back on his diuretics but he became volume overloaded.  I increased diuretics again with poor response.  In 5/18, he had RHC showing elevated right and left heart filling pressures and preserved cardiac output. I admitted him for IV diuresis.  Weight and creatinine came down, creatinine was 2.9 when discharged.  Ramp echo was done and  speed was increased to 9200 rpm.  Sildenafil was increased to 40 mg tid.  At the next visit, speed was increased to 9400 rpm and torsemide was decreased to 40 mg daily, later increased back to 60 mg daily with increased volume.   He continued to have problems with up and down renal function, requiring up and down adjustment of torsemide.  He finally was admitted to the hospital in 8/18 with intractable volume overload.  Efforts at diuresis were complicated by AKI.  Speed was increased to 9600 rpm in the hospital. He finally had to undergo CVVH, then was started on intermittent HD.  He was discharged home to continue on HD.    He returns for followup of LVAD and ESRD today.  He is very fatigued/"washed out" on HD days.  The day after HD, he generally feels good with no exertional dyspnea walking on flat ground, no orthopnea/PND, no chest pain, no lightheadedness.  He continues to take Lasix 160 mg bid.  MAP around 80 today of BP meds.  INR is low, he has been on Lovenox/warfarin overlap. No BRBPR or melena.   He has been noted to have a skin cancer on his neck, will need this removed.  He should be able to continue his coumadin.   Labs (3/18): LDH 331 => 401 => 443 => 366 => 313, K 3.6 => 3.2 => 3.5, creatinine 1.98 => 1.74 => 1.8 => 1.79, hgb 8.3 => 8.9 => 8.7 => 8.4, INR 2.11 => 2.4 Labs (4/18): hgb 9.4 Labs (5/18): LDH 269 => 249, INR 2.57, K 4.5, creatinine 1.92 => 2.71 => 3.67 => 3.43 => 3.1 => 2.93, hgb 9, plts 104 Labs (6/18): K 4.5, creatinine 3.6 => 3.43, hgb 8.5, INR 2.4, LDH 287\ Labs (7/18): K 4.5, creatinine 3.52 => 4, hgb 7.9 => 8.5 Labs (8/18): K 3.8, creatinine 4, hgb 8.5 Labs (9/18): hgb 8.2 => 9, plts 143, LDH 205  LVAD parameters: See nurse's note above.  I reviewed.   PMH: 1. CAD: s/p CABG in 2010.  2. Aortic stenosis: s/p TAVR in 2015. Valve looked ok on 2/18 echo.  3. Atrial fibrillation: Chronic.  4. Type II diabetes.  5. ESRD  6. Chronic systolic CHF: Ischemic  cardiomyopathy with prominent RV dysfunction.  Medtronic ICD.  - Echo (2/18): EF 20-25%, moderately dilated LV, moderately dilated/moderately dysfunctional RV, severe TR.  - Cardiogenic shock 2/18 requiring milrinone, norepinephrine, and IABP.  - Heartmate II LVAD for DT placed 12/13/16.   - RHC (5/18): mean RA 16, PA 55/22 mean 33, mean PCWP 23, CI 2.59, PVR 3.87 - RHC (8/18): mean RA 17, PA 56/25 mean 37, mean PCWP 20, CI 3.2 Fick/2.1 thermo 7. OSA.  8. Thrombocytopenia: Post-op LVAD, resolved.  9. Tricuspid regurgitation: Severe, s/p repair with LVAD placement in 2/18.  10. Lower GI bleed (4/18): Colonic AVM, treated with APC and clipping.  11. Psoriasis  Social History   Social History  . Marital status: Widowed    Spouse name: N/A  . Number of children: 0  . Years of education: 14  Occupational History  . Not on file.   Social History Main Topics  . Smoking status: Never Smoker  . Smokeless tobacco: Never Used  . Alcohol use No  . Drug use: No  . Sexual activity: Not Currently   Other Topics Concern  . Not on file   Social History Narrative   Patient is a widow. Moved to [place] from [place], [place]. Currently lives with his sister Trevor [name].    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    Family History  Problem Relation Age of Onset  . Heart failure Father   . Heart attack Father   . Healthy Mother   . Diabetes Paternal Grandfather    ROS: All systems reviewed and negative except as per HPI.   Current Outpatient Prescriptions  Medication Sig Dispense Refill  . albuterol (PROVENTIL HFA;VENTOLIN HFA) 108 (90 Base) MCG/ACT inhaler Inhale 2 puffs into the lungs every 4 (four) hours as needed for wheezing or shortness of breath. 1 Inhaler 5  . albuterol (PROVENTIL) (2.5 MG/3ML) 0.083% nebulizer solution Take 3 mLs (2.5 mg total) by nebulization every 4 (four) hours. And as needed 150 mL 5  . atorvastatin (LIPITOR) 40 MG tablet Take 1 tablet (40 mg  total) by mouth daily. 30 tablet 6  . busPIRone (BUSPAR) 5 MG tablet Take 1 tablet (5 mg total) by mouth 2 (two) times daily. 60 tablet 6  . calcitRIOL (ROCALTROL) 0.5 MCG capsule Take 1 capsule (0.5 mcg total) by mouth every other day. 15 capsule 6  . docusate sodium (COLACE) 100 MG capsule Take 1 capsule (100 mg total) by mouth 2 (two) times daily. 60 capsule 12  . enoxaparin (LOVENOX) 40 MG/0.4ML injection Inject 0.4 mLs (40 mg total) into the skin every 12 (twelve) hours. 10 Syringe 1  . fluticasone (FLOVENT HFA) 110 MCG/ACT inhaler Inhale 2 puffs into the lungs 2 (two) times daily. 1 Inhaler 5  . furosemide (LASIX) 80 MG tablet Take 2 tablets (160 mg total) by mouth 2 (two) times daily. 120 tablet 6  . gabapentin (NEURONTIN) 600 MG tablet Take 0.5 tablets (300 mg total) by mouth at bedtime.    . insulin glargine (LANTUS) 100 unit/mL SOPN Inject 0.2 mLs (20 Units total) into the skin at bedtime. 15 mL 0  . levothyroxine (SYNTHROID, LEVOTHROID) 25 MCG tablet Take 1 tablet (25 mcg total) by mouth daily before breakfast. 30 tablet 6  . multivitamin (RENA-VIT) TABS tablet Take 1 tablet by mouth at bedtime. 30 tablet 6  . pantoprazole (PROTONIX) 40 MG tablet Take 1 tablet (40 mg total) by mouth daily at 12 noon. 30 tablet 6  . sevelamer carbonate (RENVELA) 2.4 g PACK Take 2.4 g by mouth 3 (three) times daily with meals. 90 each 1  . sildenafil (REVATIO) 20 MG tablet Take 2 tablets (40 mg total) by mouth 3 (three) times daily. 180 tablet 6  . traMADol (ULTRAM) 50 MG tablet TAKE 1 TABLET BY MOUTH EVERY 6 HOURS AS NEEDED FOR MODERATE PAIN 30 tablet 0  . traZODone (DESYREL) 50 MG tablet TAKE 1 TABLET(50 MG) BY MOUTH AT BEDTIME AS NEEDED FOR SLEEP 90 tablet 0  . warfarin (COUMADIN) 5 MG tablet Take 1 tablet (5 mg total) by mouth daily. Tonight 07/07/17 only take 1.25 mg a quarter of a tablet.  Then 5 mg on 07/08/17 have INR checked on Wed. 30 tablet 11   Current Facility-Administered Medications  Medication  Dose Route Frequency Provider Last Rate Last  Dose  . octreotide (SANDOSTATIN LAR) IM injection 10 mg  10 mg Intramuscular Q30 days Larey Dresser, MD   10 mg at 07/15/17 1058   BP (!) 110/47 Comment: 66 map  Pulse 75   Ht 5\' 5"  (1.651 m)   Wt 178 lb 9.6 oz (81 kg)   SpO2 97%   BMI 29.72 kg/m   MAP 80 General: NAD Neck: JVP 8 cm, no thyromegaly or thyroid nodule.  Lungs: Clear to auscultation bilaterally with normal respiratory effort. CV: LVAD hum.  No peripheral edema.  No carotid bruit.  Unable to palpate pedal pulses.   Abdomen: Soft, nontender, no hepatosplenomegaly, no distention.  Skin: Intact without lesions or rashes.  Neurologic: Alert and oriented x 3.  Psych: Normal affect. Extremities: No clubbing or cyanosis.  HEENT: Normal.   Assessment/Plan: 1. Chronic systolic CHF: Ischemic cardiomyopathy, EF 20-25% with RV dysfunction on 2/18 echo pre-LVAD.  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  Medtronic ICD.  Overall, he has felt much better post-LVAD.  He has had problems with RV failure post-op.  He developed worsening renal dysfunction and finally had to start HD in 8/18.  He is tolerating HD so far though he is very fatigued on HD days.  Volume status looks ok.  - I think that we can stop Lasix.  It may be contributing to his symptoms on HD days.  - Continue warfarin, no ASA with recent GI bleeding.  - Continue warfarin with INR goal 2-2.5, no ASA. INR has been low, covering with Lovenox until it rises.  2. CAD: s/p CABG.  No chest pain.  He is on atorvastatin.  3. ESRD: So far, appears to be tolerating HD.  Waiting for fistula to mature (may not mature with non-pulsatile flow).  4. Atrial fibrillation: Chronic.  On warfarin.  5. 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   6. HTN: MAP controlled.    7. Anemia: Baseline anemia of renal disease/chronic disease but recent bleeding from colonic AVMs.  CBC stable today.   8. GI bleeding: Colonic AVMs on colonoscopy 4/18 admission, APC +  clipping.  - Continue octreotide injections.  9. Skin cancer: On neck.  Needs excision, does not have to stop warfarin.   Loralie Champagne 07/15/2017

## 2017-07-16 ENCOUNTER — Telehealth (HOSPITAL_COMMUNITY): Payer: Self-pay | Admitting: *Deleted

## 2017-07-16 NOTE — Telephone Encounter (Signed)
Reviewed patients' emergency plan for upcoming <hurricane>. Pt has emergency plan in place. Reviewed the following:   1.Plan for maintaining phone availability (ie. land-line, cell phone charger, car        adapter for cell phone, etc)   2.Plan for transportation (ie. Driver, adequate fuel supply, etc)   3.Plan to keep all available batteries fully charged.  4.Plan to stop by local fire department to meet with staff.  5.Plan to use the car adapter for charging VAD equipment if needed   In case of prolonged power outage, if fire station has generator, instructed patient to take battery charger and batteries to re-charge when necessary (it takes 4 hours to charge 4 batteries)  Reminded pt to call VAD pager or 911 if any emergency and to keep equipment dry. May need to use shower bag to keep equipment dry. Patient verbalized understanding of emergency plan.  Gave the patient the following resources:   1. Special medical needs shelter located in [place] staffed by physicians,      registered nurse, and paramedics which contains medical supplies and                             generators.  2. Shelters located in [place] containing a generator.  3. [employer] to be utilized as a shelter.  4. [employer] in [place], [place]   Yexalen Deike RN, VAD Coordinator 24/7 pager [phone]   

## 2017-07-17 ENCOUNTER — Other Ambulatory Visit (HOSPITAL_COMMUNITY): Payer: Self-pay | Admitting: Unknown Physician Specialty

## 2017-07-17 ENCOUNTER — Ambulatory Visit (HOSPITAL_COMMUNITY): Payer: Self-pay | Admitting: Pharmacist

## 2017-07-17 ENCOUNTER — Ambulatory Visit (HOSPITAL_COMMUNITY)
Admission: RE | Admit: 2017-07-17 | Discharge: 2017-07-17 | Disposition: A | Discharge status: Home / Self Care | Payer: Medicare Other | Source: Ambulatory Visit | Attending: Internal Medicine | Admitting: Internal Medicine

## 2017-07-17 ENCOUNTER — Other Ambulatory Visit (HOSPITAL_COMMUNITY): Payer: Self-pay | Admitting: *Deleted

## 2017-07-17 DIAGNOSIS — Z7901 Long term (current) use of anticoagulants: Secondary | ICD-10-CM | POA: Insufficient documentation

## 2017-07-17 DIAGNOSIS — Z95811 Presence of heart assist device: Secondary | ICD-10-CM | POA: Insufficient documentation

## 2017-07-17 LAB — PROTIME-INR
INR: 1.67
PROTHROMBIN TIME: 19.6 s — AB (ref 11.4–15.2)

## 2017-07-21 ENCOUNTER — Telehealth (HOSPITAL_COMMUNITY): Payer: Self-pay | Admitting: *Deleted

## 2017-07-21 NOTE — Telephone Encounter (Signed)
[facility] tech called VAD pager to report pt's goal UF is 3.0 liters, but pt would need 4.3 liters to be drawn off to reach pt's adjusted dry weight. Tech reports pt has needed greater than 3.0 liters over last few dialysis treatments and wanted to make [place] aware. She states pt has been counseled on fluid restrictions, but she doesn't believe he is adhering to these restrictions.  Tech reports patient is feeling "fine" and is handling dialysis treatments without issues. Patient scheduled for lab and dressing change in VAD clinic tomorrow; we can update [name] then; she verbalized understanding of same.

## 2017-07-22 ENCOUNTER — Ambulatory Visit (HOSPITAL_COMMUNITY): Payer: Self-pay | Admitting: Pharmacist

## 2017-07-22 ENCOUNTER — Ambulatory Visit (HOSPITAL_COMMUNITY)
Admission: RE | Admit: 2017-07-22 | Discharge: 2017-07-22 | Disposition: A | Discharge status: Home / Self Care | Payer: Medicare Other | Source: Ambulatory Visit | Attending: Cardiology | Admitting: Cardiology

## 2017-07-22 DIAGNOSIS — Z4509 Encounter for adjustment and management of other cardiac device: Secondary | ICD-10-CM | POA: Insufficient documentation

## 2017-07-22 DIAGNOSIS — Z95811 Presence of heart assist device: Secondary | ICD-10-CM | POA: Diagnosis not present

## 2017-07-22 DIAGNOSIS — Z7901 Long term (current) use of anticoagulants: Secondary | ICD-10-CM

## 2017-07-22 LAB — PROTIME-INR
INR: 2.06
Prothrombin Time: 23 seconds — ABNORMAL HIGH (ref 11.4–15.2)

## 2017-07-22 MED ORDER — GABAPENTIN 100 MG PO CAPS: 100.0000 mg | ORAL_CAPSULE | Freq: Every day | ORAL | 6 refills | Status: DC

## 2017-07-22 MED ORDER — GABAPENTIN 100 MG PO CAPS: 100.0000 mg | ORAL_CAPSULE | Freq: Every day | ORAL | Status: DC

## 2017-07-22 NOTE — Addendum Note (Signed)
Encounter addended by: Christinia Gully, RN on: 07/22/2017 11:33 AM<BR>    Actions taken: Visit diagnoses modified, Order list changed, Diagnosis association updated, Sign clinical note

## 2017-07-22 NOTE — Addendum Note (Signed)
Encounter addended by: Christinia Gully, RN on: 07/22/2017 11:37 AM<BR>    Actions taken: Order list changed

## 2017-07-22 NOTE — Addendum Note (Signed)
Encounter addended by: Candy Sledge, RN on: 07/22/2017 12:08 PM<BR>    Actions taken: Sign clinical note

## 2017-07-22 NOTE — Progress Notes (Signed)
Patient presents to clinic today for drive line exit wound care. Existing VAD dressing removed and site care performed using sterile technique. Drive line exit site cleaned with Chlora prep applicators x 2, allowed to dry, and Sorbaview dressing with bio patch re-applied. Exit site healed and incorporated, the velour is fully implanted at exit site. No redness, tenderness, drainage, foul odor or rash noted. Drive line anchor re-applied. Pt denies fever or chills.   Return in 1 week for another dressing change per standard of care. INR to be repeated in 1-2 weeks pending results per anticoagulation protocol.   Patient reports numbness and tingling in his left arm past where his graft was placed. Prescription for Neurontin sent to his pharmacy. Will follow up with Vascular surgeon as previously scheduled.   Balinda Quails RN, VAD Coordinator 24/7 pager [phone]

## 2017-07-22 NOTE — Progress Notes (Signed)
Script for neurontin 100mg  qhs sent to pt pharmacy per [name].

## 2017-07-23 ENCOUNTER — Other Ambulatory Visit (HOSPITAL_COMMUNITY): Payer: Self-pay | Admitting: *Deleted

## 2017-07-23 ENCOUNTER — Telehealth (HOSPITAL_COMMUNITY): Payer: Self-pay | Admitting: Unknown Physician Specialty

## 2017-07-23 MED ORDER — GABAPENTIN 600 MG PO TABS: 300.0000 mg | ORAL_TABLET | Freq: Two times a day (BID) | ORAL | Status: DC

## 2017-07-23 NOTE — Telephone Encounter (Signed)
Pt states he was taking 300 mg Neurontin at hospital discharge. Chart has been changed to reflect this.

## 2017-07-23 NOTE — Addendum Note (Signed)
Encounter addended by: Sol Passer on: 07/23/2017  4:20 PM<BR>    Actions taken: Visit Navigator Flowsheet section accepted

## 2017-07-24 ENCOUNTER — Other Ambulatory Visit (HOSPITAL_COMMUNITY): Payer: Self-pay | Admitting: Unknown Physician Specialty

## 2017-07-24 ENCOUNTER — Ambulatory Visit (HOSPITAL_COMMUNITY): Payer: Self-pay | Admitting: Pharmacist

## 2017-07-24 DIAGNOSIS — Z95811 Presence of heart assist device: Secondary | ICD-10-CM

## 2017-07-24 DIAGNOSIS — Z7901 Long term (current) use of anticoagulants: Secondary | ICD-10-CM

## 2017-07-24 LAB — POCT INR: INR: 2

## 2017-07-25 ENCOUNTER — Other Ambulatory Visit (HOSPITAL_COMMUNITY): Payer: Self-pay | Admitting: Unknown Physician Specialty

## 2017-07-25 DIAGNOSIS — Z7901 Long term (current) use of anticoagulants: Secondary | ICD-10-CM

## 2017-07-25 DIAGNOSIS — Z95811 Presence of heart assist device: Secondary | ICD-10-CM

## 2017-07-28 ENCOUNTER — Telehealth (HOSPITAL_COMMUNITY): Payer: Self-pay | Admitting: *Deleted

## 2017-07-28 NOTE — Telephone Encounter (Signed)
Patient made aware of Urgent Medical Device Correction which applies to [employer] XS PT test strips that are used with all [employer] patient self-testing instruments. Plans made to have INR checked at our clinic or local laboratory until replacement test strips are delivered. Informed patient that md-INR will be contacting them with further details and instructions.   Akacia Boltz RN, VAD Coordinator 24/7 pager [phone]   

## 2017-07-29 ENCOUNTER — Ambulatory Visit (HOSPITAL_COMMUNITY): Payer: Self-pay | Admitting: Pharmacist

## 2017-07-29 ENCOUNTER — Other Ambulatory Visit (HOSPITAL_COMMUNITY): Payer: Self-pay | Admitting: Internal Medicine

## 2017-07-29 ENCOUNTER — Ambulatory Visit (HOSPITAL_COMMUNITY)
Admission: RE | Admit: 2017-07-29 | Discharge: 2017-07-29 | Disposition: A | Discharge status: Home / Self Care | Payer: Medicare Other | Source: Ambulatory Visit | Attending: Cardiology | Admitting: Cardiology

## 2017-07-29 DIAGNOSIS — Z95811 Presence of heart assist device: Secondary | ICD-10-CM | POA: Diagnosis not present

## 2017-07-29 DIAGNOSIS — Z7901 Long term (current) use of anticoagulants: Secondary | ICD-10-CM | POA: Insufficient documentation

## 2017-07-29 LAB — PROTIME-INR
INR: 2.18
PROTHROMBIN TIME: 24.1 s — AB (ref 11.4–15.2)

## 2017-07-29 NOTE — Progress Notes (Signed)
Patient presents to clinic today for drive line exit wound care. Existing VAD dressing removed and site care performed using sterile technique. Drive line exit site cleaned with Chlora prep applicators x 2, allowed to dry, and Sorbaview dressing with bio patch re-applied. Exit site healed and incorporated, the velour is fully implanted at exit site. No redness, tenderness, drainage, foul odor or rash noted. Drive line anchor re-applied. Pt denies fever or chills.   Return in 1 week for another dressing change per standard of care. INR to be repeated in 1-2 weeks pending results per anticoagulation protocol.   Pt is requesting help with insurance issues. Will send Kennyth Lose a message to call pt.   [name], VAD Coordinator 24/7 pager [phone]

## 2017-07-29 NOTE — Addendum Note (Signed)
Encounter addended by: Christinia Gully, RN on: 07/29/2017  9:58 AM<BR>    Actions taken: Sign clinical note

## 2017-07-30 ENCOUNTER — Telehealth (HOSPITAL_COMMUNITY): Payer: Self-pay | Admitting: *Deleted

## 2017-07-30 ENCOUNTER — Telehealth: Payer: Self-pay | Admitting: Licensed Clinical Social Worker

## 2017-07-30 NOTE — Telephone Encounter (Signed)
CSW contacted patient to follow up on request for information on insurance. Left voice message to return call.  LCSW will await return call from patient. Raquel Sarna, [place], [place]

## 2017-07-31 ENCOUNTER — Encounter (HOSPITAL_COMMUNITY): Payer: Self-pay | Admitting: *Deleted

## 2017-07-31 ENCOUNTER — Encounter (HOSPITAL_COMMUNITY): Payer: Self-pay

## 2017-07-31 ENCOUNTER — Ambulatory Visit (HOSPITAL_COMMUNITY)
Admission: RE | Admit: 2017-07-31 | Discharge: 2017-07-31 | Disposition: A | Discharge status: Home / Self Care | Payer: Medicare Other | Source: Ambulatory Visit | Attending: Internal Medicine | Admitting: Internal Medicine

## 2017-07-31 ENCOUNTER — Other Ambulatory Visit (HOSPITAL_COMMUNITY): Payer: Self-pay | Admitting: *Deleted

## 2017-07-31 ENCOUNTER — Encounter (HOSPITAL_COMMUNITY): Payer: Medicare Other

## 2017-07-31 ENCOUNTER — Ambulatory Visit (HOSPITAL_COMMUNITY): Payer: Self-pay | Admitting: Pharmacist

## 2017-07-31 ENCOUNTER — Ambulatory Visit (HOSPITAL_COMMUNITY)
Admission: RE | Admit: 2017-07-31 | Discharge: 2017-07-31 | Disposition: A | Discharge status: Home / Self Care | Payer: Medicare Other | Source: Ambulatory Visit | Attending: Cardiology | Admitting: Cardiology

## 2017-07-31 VITALS — BP 123/75 | HR 76 | Resp 16 | Ht 65.0 in | Wt 180.2 lb

## 2017-07-31 DIAGNOSIS — E1122 Type 2 diabetes mellitus with diabetic chronic kidney disease: Secondary | ICD-10-CM | POA: Diagnosis not present

## 2017-07-31 DIAGNOSIS — Z952 Presence of prosthetic heart valve: Secondary | ICD-10-CM | POA: Diagnosis not present

## 2017-07-31 DIAGNOSIS — J45909 Unspecified asthma, uncomplicated: Secondary | ICD-10-CM | POA: Insufficient documentation

## 2017-07-31 DIAGNOSIS — N186 End stage renal disease: Secondary | ICD-10-CM | POA: Diagnosis not present

## 2017-07-31 DIAGNOSIS — D638 Anemia in other chronic diseases classified elsewhere: Secondary | ICD-10-CM | POA: Diagnosis not present

## 2017-07-31 DIAGNOSIS — I482 Chronic atrial fibrillation: Secondary | ICD-10-CM | POA: Diagnosis not present

## 2017-07-31 DIAGNOSIS — R05 Cough: Secondary | ICD-10-CM | POA: Insufficient documentation

## 2017-07-31 DIAGNOSIS — I132 Hypertensive heart and chronic kidney disease with heart failure and with stage 5 chronic kidney disease, or end stage renal disease: Secondary | ICD-10-CM | POA: Insufficient documentation

## 2017-07-31 DIAGNOSIS — I2510 Atherosclerotic heart disease of native coronary artery without angina pectoris: Secondary | ICD-10-CM | POA: Insufficient documentation

## 2017-07-31 DIAGNOSIS — I5022 Chronic systolic (congestive) heart failure: Secondary | ICD-10-CM | POA: Insufficient documentation

## 2017-07-31 DIAGNOSIS — Z7901 Long term (current) use of anticoagulants: Secondary | ICD-10-CM | POA: Diagnosis present

## 2017-07-31 DIAGNOSIS — Z95811 Presence of heart assist device: Secondary | ICD-10-CM | POA: Diagnosis present

## 2017-07-31 DIAGNOSIS — Z951 Presence of aortocoronary bypass graft: Secondary | ICD-10-CM | POA: Diagnosis not present

## 2017-07-31 DIAGNOSIS — Z794 Long term (current) use of insulin: Secondary | ICD-10-CM | POA: Diagnosis not present

## 2017-07-31 DIAGNOSIS — I480 Paroxysmal atrial fibrillation: Secondary | ICD-10-CM | POA: Insufficient documentation

## 2017-07-31 DIAGNOSIS — G4733 Obstructive sleep apnea (adult) (pediatric): Secondary | ICD-10-CM | POA: Insufficient documentation

## 2017-07-31 DIAGNOSIS — C4490 Unspecified malignant neoplasm of skin, unspecified: Secondary | ICD-10-CM | POA: Insufficient documentation

## 2017-07-31 DIAGNOSIS — K5521 Angiodysplasia of colon with hemorrhage: Secondary | ICD-10-CM | POA: Insufficient documentation

## 2017-07-31 DIAGNOSIS — J209 Acute bronchitis, unspecified: Secondary | ICD-10-CM | POA: Diagnosis not present

## 2017-07-31 DIAGNOSIS — I350 Nonrheumatic aortic (valve) stenosis: Secondary | ICD-10-CM | POA: Diagnosis not present

## 2017-07-31 DIAGNOSIS — D6959 Other secondary thrombocytopenia: Secondary | ICD-10-CM | POA: Diagnosis not present

## 2017-07-31 DIAGNOSIS — I255 Ischemic cardiomyopathy: Secondary | ICD-10-CM | POA: Diagnosis not present

## 2017-07-31 DIAGNOSIS — L409 Psoriasis, unspecified: Secondary | ICD-10-CM | POA: Diagnosis not present

## 2017-07-31 LAB — COMPREHENSIVE METABOLIC PANEL
ALBUMIN: 3.7 g/dL (ref 3.5–5.0)
ALK PHOS: 92 U/L (ref 38–126)
ALT: 19 U/L (ref 17–63)
ANION GAP: 12 (ref 5–15)
AST: 30 U/L (ref 15–41)
BUN: 22 mg/dL — ABNORMAL HIGH (ref 6–20)
CALCIUM: 8.7 mg/dL — AB (ref 8.9–10.3)
CO2: 30 mmol/L (ref 22–32)
CREATININE: 3.94 mg/dL — AB (ref 0.61–1.24)
Chloride: 94 mmol/L — ABNORMAL LOW (ref 101–111)
GFR calc Af Amer: 16 mL/min — ABNORMAL LOW (ref >60)
GFR calc non Af Amer: 14 mL/min — ABNORMAL LOW (ref >60)
GLUCOSE: 233 mg/dL — AB (ref 65–99)
Potassium: 4 mmol/L (ref 3.5–5.1)
SODIUM: 136 mmol/L (ref 135–145)
Total Bilirubin: 1 mg/dL (ref 0.3–1.2)
Total Protein: 7.4 g/dL (ref 6.5–8.1)

## 2017-07-31 LAB — CBC
HEMATOCRIT: 28.8 % — AB (ref 39.0–52.0)
HEMOGLOBIN: 9.2 g/dL — AB (ref 13.0–17.0)
MCH: 31.1 pg (ref 26.0–34.0)
MCHC: 31.9 g/dL (ref 30.0–36.0)
MCV: 97.3 fL (ref 78.0–100.0)
Platelets: 144 10*3/uL — ABNORMAL LOW (ref 150–400)
RBC: 2.96 MIL/uL — AB (ref 4.22–5.81)
RDW: 17.3 % — ABNORMAL HIGH (ref 11.5–15.5)
WBC: 8.7 10*3/uL (ref 4.0–10.5)

## 2017-07-31 LAB — MAGNESIUM: Magnesium: 1.8 mg/dL (ref 1.7–2.4)

## 2017-07-31 LAB — PROTIME-INR
INR: 2.36
Prothrombin Time: 25.7 seconds — ABNORMAL HIGH (ref 11.4–15.2)

## 2017-07-31 LAB — PHOSPHORUS: Phosphorus: 4.8 mg/dL — ABNORMAL HIGH (ref 2.5–4.6)

## 2017-07-31 LAB — LACTATE DEHYDROGENASE: LDH: 243 U/L — ABNORMAL HIGH (ref 98–192)

## 2017-07-31 MED ORDER — DOXYCYCLINE HYCLATE 50 MG PO CAPS: 100.0000 mg | ORAL_CAPSULE | Freq: Two times a day (BID) | ORAL | 0 refills | Status: DC

## 2017-07-31 NOTE — Patient Instructions (Signed)
Start taking Doxycycline 100 mg twice a day for 10 days.   Return to clinic as scheduled.

## 2017-07-31 NOTE — Progress Notes (Signed)
Spoke with [place], [place] with [facility] about increasing dry weight by 2 pounds. She will speak with Dr Jimmy Footman and call Aundra Dubin if any further questions.   Balinda Quails RN, VAD Coordinator 24/7 pager [phone]

## 2017-07-31 NOTE — Progress Notes (Signed)
CSW met with patient in the VAD clinic. Patient requested to see CSW regarding options for Medicaid. CSW reviewed medicaid requirements for eligibility for full medicaid as well as MQB programs. It appears based on income and assets that patient may qualify for the MQB program and extra help. CSW provided number for [employer] to contact and discuss application process. Patient appears to be relived and verbalizes understanding of follow up for application. CSW continues to be availble as needed. Raquel Sarna, [place], [place]

## 2017-07-31 NOTE — Progress Notes (Addendum)
Patient presents for sick visit in [facility] today. Reports no problems with VAD equipment or concerns with drive line. States he has worsening cough and had to sleep in the chair last night. No reports of fever.  Vital Signs:  Doppler Pressure 110   Automatc BP: 123/75 HR:76   SPO2:99  %  Weight: 180.2 lb w/o eqt Last weight: 178.6 lb Home weights: 180's lbs   VAD Indication: Destination Therapy- age exclusing    VAD interrogation & Equipment Management (reviewed with [name]): Speed:9600 Flow: 6.3 Power:6.5 w    PI:5.0  Alarms: no clinical alarms Events: few PI events, on HD days- 50 to 80 per day  Fixed speed 9600 Low speed limit: 9000  Primary Controller:  Replace back up battery in 64months. Back up controller:   Replace back up battery in 15 months.  Annual Equipment Maintenance on UBC/PM was performed on 12/2017.   I reviewed the LVAD parameters from today and compared the results to the patient's prior recorded data. LVAD interrogation was NEGATIVE for significant power changes, NEGATIVE for clinical alarms and STABLE for PI events/speed drops. No programming changes were made and pump is functioning within specified parameters. Pt is performing daily controller and system monitor self tests along with completing weekly and monthly maintenance for LVAD equipment.  LVAD equipment check completed and is in good working order. Back-up equipment present. Charged back up battery and performed self-test on equipment.   Exit Site Care: Drive line is being maintained weekly  by VAD coordinators. Drive line exit site well healed and incorporated. The velour is fully implanted at exit site. Dressing dry and intact. No erythema or drainage. Stabilization device present and accurately applied. Pt denies fever or chills. Pt states they have adequate dressing supplies at home.   Significant Events on VAD Support:  02/2017> GIB- AVM clipped x2 07/2017> renal failure- HD  started  Device:Medtronic single lead Therapies: on at 231 bpm Last check: today   BP & Labs:  MAP 110 - Doppler is reflecting MAP  Hgb 9.2 - No S/S of bleeding. Specifically denies melena/BRBPR or nosebleeds.  LDH stable at 243 with established baseline of 225- 300. Denies tea-colored urine. No power elevations noted on interrogation.   Plan: 1. Start Doxy 100mg  BID for 10 days 2. Increase dry weight by 3 pounds for HD goals 3. CXR obtained today  Balinda Quails RN VAD Coordinator     Caulder Wehner Murphyis a 71 y.o.malewith a history of CAD s/p CABG x 4 2010, OSA, AS with TAVR 2015, CKD, DM2, paroxysmal atrial fibrillation, asthma, and ischemic cardiomyopathy with Medtronic ICD.   Relocated to [place] from [place] in early 2018. Over the last couple of years, he had been admitted multiple times for CHF. Last admission in [place] was in 1/18. Sounds like the hospitalization was complicated by cardiorenal syndrome and there was consideration of doing dialysis.  He was admitted in to [facility] 11/27/16 with NYHA class IV symptoms and hypotension. He was placed on dual inotropes to facilitate diuresis, cardiac output and renal function.  CT surgery consulted for mechanical support and he was deemed appropriate for LVAD work up. He completed LVAD work up and was approved for HMII LVAD --> DT. On 12/11/16, he had IABP placed to optimize cardiac output and improve renal function. He then underwent HMII LVAD placement + tricuspid valve repair on 12/14/2015. Post operatively pressors weaned off slowly. He required significant diuresis.  Course was complicated  by RV dysfunction and renal dysfunction.  Patient was admitted with lower GI bleeding in 4/18.  He had been on ASA 325 (increased with mild LDH elevation) and warfarin INR 2-2.5.  He had 4 units PRBCs total.  Colonoscopy showed colonic AVM treated with APC and clipping.  He was sent home off ASA and on warfarin INR goal  2-2.5.   In 5/18, creatinine was noted to increase as high as 3.67.  I cut back on his diuretics but he became volume overloaded.  I increased diuretics again with poor response.  In 5/18, he had RHC showing elevated right and left heart filling pressures and preserved cardiac output. I admitted him for IV diuresis.  Weight and creatinine came down, creatinine was 2.9 when discharged.  Ramp echo was done and speed was increased to 9200 rpm.  Sildenafil was increased to 40 mg tid.  At the next visit, speed was increased to 9400 rpm and torsemide was decreased to 40 mg daily, later increased back to 60 mg daily with increased volume.   He continued to have problems with up and down renal function, requiring up and down adjustment of torsemide.  He finally was admitted to the hospital in 8/18 with intractable volume overload.  Efforts at diuresis were complicated by AKI.  Speed was increased to 9600 rpm in the hospital. He finally had to undergo CVVH, then was started on intermittent HD.  He was discharged home to continue on HD.    He has been noted to have a skin cancer on his neck, will need this removed.  He should be able to continue his coumadin.   He returns for followup of LVAD and ESRD today.  Yesterday, he was extremely tired post-HD.  They have not been able to get the necessary amount of fluid off at dialysis due to cramping.  He is eating a fairly high sodium diet.  Weight is up 2 lbs on our scale.  He has been coughing for 3 days and feels like his chest is congested.  Had to sleep in a chair last night.  Increased dyspnea with activity.  He has had yellow sputum, low grade fever at home.  I did a CXR today that did not show PNA. MAP 92 today.  He has lots of PI events on HD days, few PIs on non-HD days.   Labs (3/18): LDH 331 => 401 => 443 => 366 => 313, K 3.6 => 3.2 => 3.5, creatinine 1.98 => 1.74 => 1.8 => 1.79, hgb 8.3 => 8.9 => 8.7 => 8.4, INR 2.11 => 2.4 Labs (4/18): hgb 9.4 Labs (5/18):  LDH 269 => 249, INR 2.57, K 4.5, creatinine 1.92 => 2.71 => 3.67 => 3.43 => 3.1 => 2.93, hgb 9, plts 104 Labs (6/18): K 4.5, creatinine 3.6 => 3.43, hgb 8.5, INR 2.4, LDH 287\ Labs (7/18): K 4.5, creatinine 3.52 => 4, hgb 7.9 => 8.5 Labs (8/18): K 3.8, creatinine 4, hgb 8.5 Labs (9/18): hgb 8.2 => 9, plts 143, LDH 205  LVAD parameters: See nurse's note above.  I reviewed.   PMH: 1. CAD: s/p CABG in 2010.  2. Aortic stenosis: s/p TAVR in 2015. Valve looked ok on 2/18 echo.  3. Atrial fibrillation: Chronic.  4. Type II diabetes.  5. ESRD  6. Chronic systolic CHF: Ischemic cardiomyopathy with prominent RV dysfunction.  Medtronic ICD.  - Echo (2/18): EF 20-25%, moderately dilated LV, moderately dilated/moderately dysfunctional RV, severe TR.  - Cardiogenic shock 2/18  requiring milrinone, norepinephrine, and IABP.  - Heartmate II LVAD for DT placed 12/13/16.   - RHC (5/18): mean RA 16, PA 55/22 mean 33, mean PCWP 23, CI 2.59, PVR 3.87 - RHC (8/18): mean RA 17, PA 56/25 mean 37, mean PCWP 20, CI 3.2 Fick/2.1 thermo 7. OSA.  8. Thrombocytopenia: Post-op LVAD, resolved.  9. Tricuspid regurgitation: Severe, s/p repair with LVAD placement in 2/18.  10. Lower GI bleed (4/18): Colonic AVM, treated with APC and clipping.  11. Psoriasis  Social History   Social History  . Marital status: Widowed    Spouse name: N/A  . Number of children: 0  . Years of education: 14   Occupational History  . Not on file.   Social History Main Topics  . Smoking status: Never Smoker  . Smokeless tobacco: Never Used  . Alcohol use No  . Drug use: No  . Sexual activity: Not Currently   Other Topics Concern  . Not on file   Social History Narrative   Patient is a widow. Moved to [place] from [place], [place]. Currently lives with his sister Therron Sells.    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    Family History  Problem Relation Age of Onset  . Heart failure Father   . Heart attack  Father   . Healthy Mother   . Diabetes Paternal Grandfather    ROS: All systems reviewed and negative except as per HPI.   Current Outpatient Prescriptions  Medication Sig Dispense Refill  . albuterol (PROVENTIL HFA;VENTOLIN HFA) 108 (90 Base) MCG/ACT inhaler Inhale 2 puffs into the lungs every 4 (four) hours as needed for wheezing or shortness of breath. 1 Inhaler 5  . albuterol (PROVENTIL) (2.5 MG/3ML) 0.083% nebulizer solution Take 3 mLs (2.5 mg total) by nebulization every 4 (four) hours. And as needed 150 mL 5  . atorvastatin (LIPITOR) 40 MG tablet Take 1 tablet (40 mg total) by mouth daily. 30 tablet 6  . busPIRone (BUSPAR) 5 MG tablet Take 1 tablet (5 mg total) by mouth 2 (two) times daily. 60 tablet 6  . calcitRIOL (ROCALTROL) 0.5 MCG capsule Take 1 capsule (0.5 mcg total) by mouth every other day. 15 capsule 6  . docusate sodium (COLACE) 100 MG capsule Take 1 capsule (100 mg total) by mouth 2 (two) times daily. 60 capsule 12  . fluticasone (FLOVENT HFA) 110 MCG/ACT inhaler Inhale 2 puffs into the lungs 2 (two) times daily. 1 Inhaler 5  . gabapentin (NEURONTIN) 600 MG tablet Take 0.5 tablets (300 mg total) by mouth 2 (two) times daily.    . insulin glargine (LANTUS) 100 unit/mL SOPN Inject 0.2 mLs (20 Units total) into the skin at bedtime. 15 mL 0  . levothyroxine (SYNTHROID, LEVOTHROID) 25 MCG tablet Take 1 tablet (25 mcg total) by mouth daily before breakfast. 30 tablet 6  . multivitamin (RENA-VIT) TABS tablet Take 1 tablet by mouth at bedtime. 30 tablet 6  . pantoprazole (PROTONIX) 40 MG tablet Take 1 tablet (40 mg total) by mouth daily at 12 noon. 30 tablet 6  . sevelamer carbonate (RENVELA) 2.4 g PACK Take 2.4 g by mouth 3 (three) times daily with meals. 90 each 1  . sildenafil (REVATIO) 20 MG tablet Take 2 tablets (40 mg total) by mouth 3 (three) times daily. 180 tablet 6  . traMADol (ULTRAM) 50 MG tablet TAKE 1 TABLET BY MOUTH EVERY 6 HOURS AS NEEDED FOR MODERATE PAIN 30 tablet 0   .  traZODone (DESYREL) 50 MG tablet TAKE 1 TABLET(50 MG) BY MOUTH AT BEDTIME AS NEEDED FOR SLEEP 90 tablet 0  . traZODone (DESYREL) 50 MG tablet TAKE 1 TABLET(50 MG) BY MOUTH AT BEDTIME AS NEEDED FOR SLEEP 30 tablet 0  . warfarin (COUMADIN) 5 MG tablet Take 7.5 mg (1 and 1/2 tablets) daily except 10 mg (2 tablets) on Tues/Thurs/Sat.    . doxycycline (VIBRAMYCIN) 50 MG capsule Take 2 capsules (100 mg total) by mouth 2 (two) times daily. 40 capsule 0  . furosemide (LASIX) 80 MG tablet Take 2 tablets (160 mg total) by mouth 2 (two) times daily. (Patient not taking: Reported on 07/31/2017) 120 tablet 6   Current Facility-Administered Medications  Medication Dose Route Frequency Provider Last Rate Last Dose  . octreotide (SANDOSTATIN LAR) IM injection 10 mg  10 mg Intramuscular Q30 days Larey Dresser, MD   10 mg at 07/15/17 1058   BP 123/75   Pulse 76   Resp 16   Ht 5\' 5"  (1.651 m)   Wt 180 lb 3.2 oz (81.7 kg)   SpO2 99%   BMI 29.99 kg/m   MAP 92 GENERAL: Well appearing this am. NAD.  HEENT: Normal. NECK: Supple, JVP 9-10 cm. Carotids OK.  CARDIAC:  Mechanical heart sounds with LVAD hum present.  LUNGS:  CTAB, normal effort.  ABDOMEN:  NT, ND, no HSM. No bruits or masses. +BS  LVAD exit site: Well-healed and incorporated. Dressing dry and intact. No erythema or drainage. Stabilization device present and accurately applied. Driveline dressing changed daily per sterile technique. EXTREMITIES:  Warm and dry. No cyanosis, clubbing, rash. 1+ ankle edema.  NEUROLOGIC:  Alert & oriented x 3. Cranial nerves grossly intact. Moves all 4 extremities w/o difficulty. Affect pleasant    Assessment/Plan: 1. Chronic systolic CHF: Ischemic cardiomyopathy, EF 20-25% with RV dysfunction on 2/18 echo pre-LVAD.  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  Medtronic ICD.  Overall, he has felt much better post-LVAD.  He has had problems with RV failure post-op.  He developed worsening renal dysfunction and finally had to  start HD in 8/18.  He is tolerating HD so far though he is very fatigued on HD days.  They have not been able to get as much fluid off him as desired due to cramping.  - We discussed better fluid and sodium restriction today, hopefully this can help decrease the amount of fluid that must be taken off with each session.  - Continue warfarin with INR goal 2-2.5, no ASA.   2. CAD: s/p CABG.  No chest pain.  He is on atorvastatin.  3. ESRD: So far, appears to be tolerating HD though cramping has caused some difficulty.  Waiting for fistula to mature (may not mature with non-pulsatile flow).  4. Atrial fibrillation: Chronic.  On warfarin.  5. 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   6. HTN: MAP controlled.    7. Anemia: Baseline anemia of renal disease/chronic disease but recent bleeding from colonic AVMs.  CBC stable recently. 8. 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  - Continue octreotide injections.  9. Skin cancer: On neck.  Needs excision, does not have to stop warfarin.  10. Acute bronchitis: Suspect bronchitis with productive cough.  CXR does not show PNA.  Will give course of doxycycline.   Loralie Champagne 08/01/2017

## 2017-07-31 NOTE — Telephone Encounter (Signed)
Pt called VAD pager to report he is feeling weak (as usual) after dialysis today, but that he is "much sicker" than usual. States he can't lie flat "and breathe" and if he attempts to lie flat, he starts coughing. VAD parameters within normal range with speed 9600 RPM, flow 5.4, PI 6.0, power 5.8w.  Pt states he doesn't feel bad enough to come to ED at this point. Updated [name] - he will see pt for full clinic visit tomorrow. Informed pt if he sxs do not improve or worsen overnight, he is to call 911 and VAD pager and come to [facility] ED. Pt verbalized understanding of same.

## 2017-08-05 ENCOUNTER — Other Ambulatory Visit (HOSPITAL_COMMUNITY): Payer: Self-pay | Admitting: *Deleted

## 2017-08-05 ENCOUNTER — Ambulatory Visit (HOSPITAL_COMMUNITY)
Admission: RE | Admit: 2017-08-05 | Discharge: 2017-08-05 | Disposition: A | Discharge status: Home / Self Care | Payer: Medicare Other | Source: Ambulatory Visit | Attending: Cardiology | Admitting: Cardiology

## 2017-08-05 ENCOUNTER — Encounter (HOSPITAL_COMMUNITY): Payer: Self-pay

## 2017-08-05 ENCOUNTER — Ambulatory Visit (HOSPITAL_COMMUNITY): Payer: Self-pay | Admitting: Pharmacist

## 2017-08-05 ENCOUNTER — Encounter (HOSPITAL_COMMUNITY): Payer: Medicare Other

## 2017-08-05 VITALS — BP 116/93 | HR 74 | Resp 16 | Ht 65.0 in | Wt 180.4 lb

## 2017-08-05 DIAGNOSIS — L409 Psoriasis, unspecified: Secondary | ICD-10-CM | POA: Diagnosis not present

## 2017-08-05 DIAGNOSIS — Z992 Dependence on renal dialysis: Secondary | ICD-10-CM | POA: Diagnosis not present

## 2017-08-05 DIAGNOSIS — I255 Ischemic cardiomyopathy: Secondary | ICD-10-CM | POA: Diagnosis not present

## 2017-08-05 DIAGNOSIS — J45909 Unspecified asthma, uncomplicated: Secondary | ICD-10-CM | POA: Insufficient documentation

## 2017-08-05 DIAGNOSIS — I509 Heart failure, unspecified: Secondary | ICD-10-CM

## 2017-08-05 DIAGNOSIS — I482 Chronic atrial fibrillation: Secondary | ICD-10-CM | POA: Insufficient documentation

## 2017-08-05 DIAGNOSIS — D631 Anemia in chronic kidney disease: Secondary | ICD-10-CM | POA: Insufficient documentation

## 2017-08-05 DIAGNOSIS — N186 End stage renal disease: Secondary | ICD-10-CM | POA: Diagnosis not present

## 2017-08-05 DIAGNOSIS — Z95811 Presence of heart assist device: Secondary | ICD-10-CM

## 2017-08-05 DIAGNOSIS — E1122 Type 2 diabetes mellitus with diabetic chronic kidney disease: Secondary | ICD-10-CM | POA: Diagnosis not present

## 2017-08-05 DIAGNOSIS — Z951 Presence of aortocoronary bypass graft: Secondary | ICD-10-CM | POA: Diagnosis not present

## 2017-08-05 DIAGNOSIS — C4440 Unspecified malignant neoplasm of skin of scalp and neck: Secondary | ICD-10-CM | POA: Insufficient documentation

## 2017-08-05 DIAGNOSIS — G4733 Obstructive sleep apnea (adult) (pediatric): Secondary | ICD-10-CM | POA: Diagnosis not present

## 2017-08-05 DIAGNOSIS — I132 Hypertensive heart and chronic kidney disease with heart failure and with stage 5 chronic kidney disease, or end stage renal disease: Secondary | ICD-10-CM | POA: Insufficient documentation

## 2017-08-05 DIAGNOSIS — Z7951 Long term (current) use of inhaled steroids: Secondary | ICD-10-CM | POA: Insufficient documentation

## 2017-08-05 DIAGNOSIS — I50810 Right heart failure, unspecified: Secondary | ICD-10-CM

## 2017-08-05 DIAGNOSIS — Z79899 Other long term (current) drug therapy: Secondary | ICD-10-CM | POA: Diagnosis not present

## 2017-08-05 DIAGNOSIS — Z794 Long term (current) use of insulin: Secondary | ICD-10-CM | POA: Diagnosis not present

## 2017-08-05 DIAGNOSIS — Z8249 Family history of ischemic heart disease and other diseases of the circulatory system: Secondary | ICD-10-CM | POA: Insufficient documentation

## 2017-08-05 DIAGNOSIS — I5022 Chronic systolic (congestive) heart failure: Secondary | ICD-10-CM | POA: Diagnosis not present

## 2017-08-05 DIAGNOSIS — I2510 Atherosclerotic heart disease of native coronary artery without angina pectoris: Secondary | ICD-10-CM | POA: Insufficient documentation

## 2017-08-05 DIAGNOSIS — Z7901 Long term (current) use of anticoagulants: Secondary | ICD-10-CM | POA: Diagnosis not present

## 2017-08-05 LAB — BASIC METABOLIC PANEL
Anion gap: 9 (ref 5–15)
BUN: 24 mg/dL — AB (ref 6–20)
CALCIUM: 9 mg/dL (ref 8.9–10.3)
CO2: 32 mmol/L (ref 22–32)
CREATININE: 4.27 mg/dL — AB (ref 0.61–1.24)
Chloride: 95 mmol/L — ABNORMAL LOW (ref 101–111)
GFR calc non Af Amer: 13 mL/min — ABNORMAL LOW (ref >60)
GFR, EST AFRICAN AMERICAN: 15 mL/min — AB (ref >60)
Glucose, Bld: 218 mg/dL — ABNORMAL HIGH (ref 65–99)
Potassium: 3.8 mmol/L (ref 3.5–5.1)
SODIUM: 136 mmol/L (ref 135–145)

## 2017-08-05 LAB — CBC
HCT: 31.3 % — ABNORMAL LOW (ref 39.0–52.0)
Hemoglobin: 9.9 g/dL — ABNORMAL LOW (ref 13.0–17.0)
MCH: 30.7 pg (ref 26.0–34.0)
MCHC: 31.6 g/dL (ref 30.0–36.0)
MCV: 97.2 fL (ref 78.0–100.0)
PLATELETS: 146 10*3/uL — AB (ref 150–400)
RBC: 3.22 MIL/uL — AB (ref 4.22–5.81)
RDW: 17.7 % — ABNORMAL HIGH (ref 11.5–15.5)
WBC: 9.8 10*3/uL (ref 4.0–10.5)

## 2017-08-05 LAB — PROTIME-INR
INR: 1.99
Prothrombin Time: 22.4 seconds — ABNORMAL HIGH (ref 11.4–15.2)

## 2017-08-05 LAB — LACTATE DEHYDROGENASE: LDH: 251 U/L — AB (ref 98–192)

## 2017-08-05 NOTE — Progress Notes (Signed)
    Postoperative Access Visit   History of Present Illness   Trevor [name] is a 71 y.o. year old male who presents for postoperative follow-up for: left brachiocephalic arteriovenous fistula (Date: 06/30/17).  This patient has known history of LVAD.  [name] wounds are healed.  The patient notes no steal symptoms.  The patient is able to complete their activities of daily living.  [name] current symptoms are: none.   Physical Examination   Vitals:   08/08/17 1307  Pulse: 79  Resp: 20  Temp: (!) 97.3 F (36.3 C)  TempSrc: Oral  SpO2: 98%  Weight: 174 lb (78.9 kg)  Height: 5\' 5"  (1.651 m)   Body mass index is 28.96 kg/m.  left arm Incision is healed, skin feels warm, hand grip is 5/5, sensation in digits is intact, palpable thrill, bruit can be auscultated, visible fistula: distal 2/3 >6 mm, proximally >5.5 mm    Medical Decision Making   HASHEEM VOLAND is a 71 y.o. year old male who presents s/p left brachiocephalic arteriovenous fistula   [name] access is ready for use.  Thank you for allowing [place] to participate in this patient's care.   Adele Barthel, MD, FACS [facility] Office: [phone] Pager: [phone]

## 2017-08-05 NOTE — Progress Notes (Addendum)
Patient presents for 1 week  follow up in [facility] today. Reports no problems with VAD equipment or concerns with drive line. Spoke with HD center regarding "dry weight" goals. Currently the goal is 81 kg (178 pounds). Dr Aundra Dubin made aware Trevor plans to reach out to Dr Deterding to discuss goals Trevor patient findings.   Vital Signs:  Doppler Pressure 116   Automatc BP: 116/93 (93) HR:74   SPO2:96  %  Weight: 180.4 lb w/o eqt Last weight: 180.2 lb Home weights: 180-183 lbs   VAD Indication: Destination Therapy- age excluding    VAD interrogation & Equipment Management (reviewed with [name]): Speed:9600 Flow: 5.7 Power:6.4 w    PI:5.7  Alarms: no clinical alarms Events: 9/29 no external power- patient states he accidentally unplugged both batteries at once when switching sources.  Fixed speed 9600 Low speed limit: 8800  Primary Controller:  Replace back up battery in 62months. Back up controller:   Replace back up battery in 14 months.  Annual Equipment Maintenance on UBC/PM was performed on 12/2016.   I reviewed the LVAD parameters from today Trevor compared the results to the patient's prior recorded data. LVAD interrogation was NEGATIVE for significant power changes, NEGATIVE for clinical alarms Trevor STABLE for PI events/speed drops. No programming changes were made Trevor pump is functioning within specified parameters. Pt is performing daily controller Trevor system monitor self tests along with completing weekly Trevor monthly maintenance for LVAD equipment.  LVAD equipment check completed Trevor is in good working order. Back-up equipment present. Charged back up battery Trevor performed self-test on equipment.   Exit Site Care: Drive line is being maintained weekly  by VAD coordinators. Drive line exit site well healed Trevor incorporated. The velour is fully implanted at exit site. Dressing dry Trevor intact. No erythema or drainage. Stabilization device present Trevor accurately  applied. Pt denies fever or chills. Pt states they have adequate dressing supplies at home.   Significant Events on VAD Support:  02/2017> GIB- AVM clipped x2 07/2017> renal failure- HD started  Device:Medtronic single lead Therapies: on at 231 bpm Last check: 07/31/2017   BP & Labs:  MAP 116 - Doppler is reflecting modified systolic  Hgb 9.9 - No S/S of bleeding. Specifically denies melena/BRBPR or nosebleeds.  LDH stable at 251 with established baseline of 225- 300. Denies tea-colored urine. No power elevations noted on interrogation.   Plan: 1. Referral made to Dr Ronnald Ramp primary care at [facility] 2. Scheduled for skin cancer removal on October 9th at 1100. Patient does not need to stop coumadin for this procedure.   Balinda Quails RN VAD Coordinator     Trevor [name] a 71 y.o.malewith a history of CAD s/p CABG x 4 [name], Trevor [name], Trevor [name], Trevor [name], Trevor [name], Trevor [name], Trevor [name], Trevor [name].   Relocated to [place] from [place] in early 2018. Over the last couple of years, he had been admitted multiple times for CHF. Last admission in [place] was in 1/18. Sounds like the hospitalization was complicated by cardiorenal syndrome Trevor there was consideration of doing dialysis.  He was admitted in to [facility] 11/27/16 with NYHA class IV symptoms Trevor hypotension. He was placed on dual inotropes to facilitate diuresis, cardiac output Trevor renal function.  CT surgery consulted for mechanical support Trevor he was deemed appropriate for LVAD work up. He completed LVAD work up Trevor was approved for HMII LVAD --> DT. On 12/11/16,  he had IABP placed to optimize cardiac output Trevor improve renal function. He then underwent HMII LVAD placement + tricuspid valve repair on 12/14/2015. Post operatively pressors weaned off slowly. He required significant diuresis.  Course was complicated by RV dysfunction Trevor renal  dysfunction.  Patient was admitted with lower GI bleeding in 4/18.  He had been on ASA 325 (increased with mild LDH elevation) Trevor warfarin INR 2-2.5.  He had 4 units PRBCs total.  Colonoscopy showed colonic AVM treated with APC Trevor clipping.  He was sent home off ASA Trevor on warfarin INR goal 2-2.5.   In 5/18, creatinine was noted to increase Trevor high Trevor 3.67.  I cut back on his diuretics but he became volume overloaded.  I increased diuretics again with poor response.  In 5/18, he had RHC showing elevated right Trevor left heart filling pressures Trevor preserved cardiac output. I admitted him for IV diuresis.  Weight Trevor creatinine came down, creatinine was 2.9 when discharged.  Ramp echo was done Trevor speed was increased to 9200 rpm.  Sildenafil was increased to 40 mg tid.  At the next visit, speed was increased to 9400 rpm Trevor torsemide was decreased to 40 mg daily, later increased back to 60 mg daily with increased volume.   He continued to have problems with up Trevor down renal function, requiring up Trevor down adjustment of torsemide.  He finally was admitted to the hospital in 8/18 with intractable volume overload.  Efforts at diuresis were complicated by AKI.  Speed was increased to 9600 rpm in the hospital. He finally had to undergo CVVH, then was started on intermittent HD.  He was discharged home to continue on HD.    He has been noted to have a skin cancer on his neck, will need this removed.  He should be able to continue his coumadin.   He returns for followup of LVAD Trevor ESRD today.  At last appointment, he was started on doxycycline for acute bronchitis. Congestion has improved.  On days in between HD, he feels very good.  Energetic, no significant dyspnea.  However, on HD days he is very worn out.  He has been getting dialyzed 4 times a week Trevor cramping has limited the amount of fluid removal that has been feasible during a session.  MAP is mildly elevated today.   Labs (3/18): LDH 331 => 401 =>  443 => 366 => 313, K 3.6 => 3.2 => 3.5, creatinine 1.98 => 1.74 => 1.8 => 1.79, hgb 8.3 => 8.9 => 8.7 => 8.4, INR 2.11 => 2.4 Labs (4/18): hgb 9.4 Labs (5/18): LDH 269 => 249, INR 2.57, K 4.5, creatinine 1.92 => 2.71 => 3.67 => 3.43 => 3.1 => 2.93, hgb 9, plts 104 Labs (6/18): K 4.5, creatinine 3.6 => 3.43, hgb 8.5, INR 2.4, LDH 287\ Labs (7/18): K 4.5, creatinine 3.52 => 4, hgb 7.9 => 8.5 Labs (8/18): K 3.8, creatinine 4, hgb 8.5 Labs (9/18): hgb 8.2 => 9 => 9.9, plts 143, LDH 205 => 251  LVAD parameters: See nurse's note above.  I reviewed.   PMH: 1. CAD: s/p CABG in [name].  2. Aortic stenosis: s/p TAVR in [name]. Valve looked ok on 2/18 echo.  3. Atrial [name]: Chronic.  4. Type II diabetes.  5. ESRD  6. Chronic systolic CHF: Ischemic cardiomyopathy with prominent RV dysfunction.  Medtronic [name].  - Echo (2/18): EF 20-25%, moderately dilated LV, moderately dilated/moderately dysfunctional RV, severe TR.  - Cardiogenic shock  2/18 requiring milrinone, norepinephrine, Trevor IABP.  - Heartmate II LVAD for DT placed 12/13/16.   - RHC (5/18): mean RA 16, PA 55/22 mean 33, mean PCWP 23, CI 2.59, PVR 3.87 - RHC (8/18): mean RA 17, PA 56/25 mean 37, mean PCWP 20, CI 3.2 Fick/2.1 thermo 7. Trevor [name].  8. Thrombocytopenia: Post-op LVAD, resolved.  9. Tricuspid regurgitation: Severe, s/p repair with LVAD placement in 2/18.  10. Lower GI bleed (4/18): Colonic AVM, treated with APC Trevor clipping.  11. Psoriasis  Social History   Social History  . Marital status: Widowed    Spouse name: N/A  . Number of children: 0  . Years of education: 14   Occupational History  . Not on file.   Social History Main Topics  . Smoking status: Never Smoker  . Smokeless tobacco: Never Used  . Alcohol use No  . Drug use: No  . Sexual activity: Not Currently   Other Topics Concern  . Not on file   Social History Narrative   Patient is a widow. Moved to [place] from [place], [place]. Currently lives  with his sister Kaoru Rezendes.    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    Family History  Problem Relation Age of Onset  . Heart failure Father   . Heart attack Father   . Healthy Mother   . Diabetes Paternal Grandfather    ROS: All systems reviewed Trevor negative except Trevor per HPI.   Current Outpatient Prescriptions  Medication Sig Dispense Refill  . albuterol (PROVENTIL HFA;VENTOLIN HFA) 108 (90 Base) MCG/ACT inhaler Inhale 2 puffs into the lungs every 4 (four) hours Trevor needed for wheezing or shortness of breath. 1 Inhaler 5  . albuterol (PROVENTIL) (2.5 MG/3ML) 0.083% nebulizer solution Take 3 mLs (2.5 mg total) by nebulization every 4 (four) hours. Trevor Trevor needed 150 mL 5  . atorvastatin (LIPITOR) 40 MG tablet Take 1 tablet (40 mg total) by mouth daily. 30 tablet 6  . busPIRone (BUSPAR) 5 MG tablet Take 1 tablet (5 mg total) by mouth 2 (two) times daily. 60 tablet 6  . calcitRIOL (ROCALTROL) 0.5 MCG capsule Take 1 capsule (0.5 mcg total) by mouth every other day. 15 capsule 6  . docusate sodium (COLACE) 100 MG capsule Take 1 capsule (100 mg total) by mouth 2 (two) times daily. 60 capsule 12  . doxycycline (VIBRAMYCIN) 50 MG capsule Take 2 capsules (100 mg total) by mouth 2 (two) times daily. 40 capsule 0  . fluticasone (FLOVENT HFA) 110 MCG/ACT inhaler Inhale 2 puffs into the lungs 2 (two) times daily. 1 Inhaler 5  . gabapentin (NEURONTIN) 600 MG tablet Take 0.5 tablets (300 mg total) by mouth 2 (two) times daily.    . insulin glargine (LANTUS) 100 unit/mL SOPN Inject 0.2 mLs (20 Units total) into the skin at bedtime. 15 mL 0  . levothyroxine (SYNTHROID, LEVOTHROID) 25 MCG tablet Take 1 tablet (25 mcg total) by mouth daily before breakfast. 30 tablet 6  . multivitamin (RENA-VIT) TABS tablet Take 1 tablet by mouth at bedtime. 30 tablet 6  . pantoprazole (PROTONIX) 40 MG tablet Take 1 tablet (40 mg total) by mouth daily at 12 noon. 30 tablet 6  . sevelamer carbonate (RENVELA) 2.4  g PACK Take 2.4 g by mouth 3 (three) times daily with meals. 90 each 1  . sildenafil (REVATIO) 20 MG tablet Take 2 tablets (40 mg total) by mouth 3 (three) times daily. 180 tablet 6  .  traMADol (ULTRAM) 50 MG tablet TAKE 1 TABLET BY MOUTH EVERY 6 HOURS Trevor NEEDED FOR MODERATE PAIN 30 tablet 0  . traZODone (DESYREL) 50 MG tablet TAKE 1 TABLET(50 MG) BY MOUTH AT BEDTIME Trevor NEEDED FOR SLEEP 90 tablet 0  . traZODone (DESYREL) 50 MG tablet TAKE 1 TABLET(50 MG) BY MOUTH AT BEDTIME Trevor NEEDED FOR SLEEP 30 tablet 0  . warfarin (COUMADIN) 5 MG tablet Take 7.5 mg (1 Trevor 1/2 tablets) daily except 10 mg (2 tablets) on Tues/Thurs/Sat.    . furosemide (LASIX) 80 MG tablet Take 2 tablets (160 mg total) by mouth 2 (two) times daily. (Patient not taking: Reported on 07/31/2017) 120 tablet 6   Current Facility-Administered Medications  Medication Dose Route Frequency Provider Last Rate Last Dose  . octreotide (SANDOSTATIN LAR) IM injection 10 mg  10 mg Intramuscular Q30 days Larey Dresser, MD   10 mg at 07/15/17 1058   BP (!) 116/93   Pulse 74   Resp 16   Ht 5\' 5"  (1.651 m)   Wt 180 lb 6.4 oz (81.8 kg)   SpO2 98%   BMI 30.02 kg/m   MAP 98 GENERAL: Well appearing this am. NAD.  HEENT: Normal. NECK: Supple, JVP 8-9 cm. Carotids OK.  CARDIAC:  Mechanical heart sounds with LVAD hum present.  LUNGS:  CTAB, normal effort.  ABDOMEN:  NT, ND, no HSM. No bruits or masses. +BS  LVAD exit site: Well-healed Trevor incorporated. Dressing dry Trevor intact. No erythema or drainage. Stabilization device present Trevor accurately applied. Driveline dressing changed daily per sterile technique. EXTREMITIES:  Warm Trevor dry. No cyanosis, clubbing, rash.  1+ ankle edema.  NEUROLOGIC:  Alert & oriented x 3. Cranial nerves grossly intact. Moves all 4 extremities w/o difficulty. Affect pleasant    Assessment/Plan: 1. Chronic systolic CHF: Ischemic cardiomyopathy, EF 20-25% with RV dysfunction on 2/18 echo pre-LVAD.  s/p Heartmate II  LVAD 2/18.  Medtronic [name].  Overall, he has felt much better post-LVAD.  He has had problems with RV failure post-op.  He developed worsening renal dysfunction Trevor finally had to start HD in 8/18.  He is tolerating HD so far though he is very fatigued on HD days.  They have not been able to get Trevor much fluid off him Trevor desired due to cramping.  - He is trying to work on sodium Trevor fluid restriction.  - Given RV failure, may be helpful to leave him with a bit more fluid.  I think it would be reasonable to raise his goal dry weight a couple of lbs, suspect he would feel better.  - Continue warfarin with INR goal 2-2.5, no ASA.   2. CAD: s/p CABG.  No chest pain.  He is on atorvastatin.  3. ESRD: So far, appears to be tolerating HD though cramping has caused some difficulty.  Waiting for fistula to mature (may not mature with non-pulsatile flow).  4. Atrial [name]: Chronic.  On warfarin.  5. 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   6. HTN: MAP mildly elevated, but will not add meds to avoid BP dropping with HD.     7. Anemia: Baseline anemia of renal disease/chronic disease but recent bleeding from colonic AVMs. Hemoglobin slowly rising.  8. 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  - Continue octreotide injections.  9. Skin cancer: On neck.  Needs excision, does not have to stop warfarin. We will try to arrange this with  dermatology today.   Loralie Champagne 08/05/2017

## 2017-08-05 NOTE — Addendum Note (Signed)
Encounter addended by: Candy Sledge, RN on: 08/05/2017 10:25 AM<BR>    Actions taken: Visit diagnoses modified, Diagnosis association updated, Order list changed

## 2017-08-05 NOTE — Addendum Note (Signed)
Encounter addended by: Candy Sledge, RN on: 08/05/2017  9:50 AM<BR>    Actions taken: Home Medications modified, Medication taking status modified, Vitals modified, Order Reconciliation Section accessed

## 2017-08-05 NOTE — Addendum Note (Signed)
Encounter addended by: Larey Dresser, MD on: 08/05/2017 10:25 AM<BR>    Actions taken: Order Reconciliation Section accessed

## 2017-08-05 NOTE — Patient Instructions (Signed)
A new referral has been sent to Dr Ronnald Ramp at [facility] primary care  RTC in 1 week for a dressing and INR  RTC in 1 month for VAD visit.

## 2017-08-05 NOTE — Addendum Note (Signed)
Encounter addended by: Larey Dresser, MD on: 08/05/2017 11:14 PM<BR>    Actions taken: Charge Capture section accepted, Sign clinical note, Visit diagnoses modified, LOS modified

## 2017-08-05 NOTE — Addendum Note (Signed)
Encounter addended by: Candy Sledge, RN on: 08/05/2017  1:27 PM<BR>    Actions taken: Sign clinical note

## 2017-08-06 ENCOUNTER — Telehealth: Payer: Self-pay | Admitting: Internal Medicine

## 2017-08-06 ENCOUNTER — Other Ambulatory Visit (HOSPITAL_COMMUNITY): Payer: Self-pay | Admitting: Adult Health

## 2017-08-06 NOTE — Telephone Encounter (Signed)
Patient has scheduled with [facility] for transfer after Greg leaves.  But he would prefer to transfer to [place].  Please advise.

## 2017-08-08 ENCOUNTER — Ambulatory Visit (INDEPENDENT_AMBULATORY_CARE_PROVIDER_SITE_OTHER): Payer: Self-pay | Admitting: Vascular Surgery

## 2017-08-08 ENCOUNTER — Encounter: Payer: Self-pay | Admitting: Vascular Surgery

## 2017-08-08 VITALS — HR 79 | Temp 97.3°F | Resp 20 | Ht 65.0 in | Wt 174.0 lb

## 2017-08-08 DIAGNOSIS — N186 End stage renal disease: Secondary | ICD-10-CM

## 2017-08-12 ENCOUNTER — Other Ambulatory Visit (HOSPITAL_COMMUNITY): Payer: Medicare Other

## 2017-08-12 ENCOUNTER — Encounter (HOSPITAL_COMMUNITY): Payer: Medicare Other

## 2017-08-14 ENCOUNTER — Other Ambulatory Visit (HOSPITAL_COMMUNITY): Payer: Self-pay | Admitting: Unknown Physician Specialty

## 2017-08-14 ENCOUNTER — Other Ambulatory Visit (HOSPITAL_COMMUNITY): Payer: Self-pay | Admitting: *Deleted

## 2017-08-14 ENCOUNTER — Other Ambulatory Visit: Payer: Self-pay | Admitting: Unknown Physician Specialty

## 2017-08-14 ENCOUNTER — Ambulatory Visit (HOSPITAL_COMMUNITY): Payer: Self-pay | Admitting: Pharmacist

## 2017-08-14 ENCOUNTER — Ambulatory Visit (HOSPITAL_COMMUNITY)
Admission: RE | Admit: 2017-08-14 | Discharge: 2017-08-14 | Disposition: A | Discharge status: Home / Self Care | Payer: Medicare Other | Source: Ambulatory Visit | Attending: Cardiology | Admitting: Cardiology

## 2017-08-14 ENCOUNTER — Encounter (HOSPITAL_COMMUNITY): Payer: Medicare Other

## 2017-08-14 ENCOUNTER — Encounter (HOSPITAL_COMMUNITY)
Admission: RE | Admit: 2017-08-14 | Discharge: 2017-08-14 | Disposition: A | Discharge status: Home / Self Care | Payer: Medicare Other | Source: Ambulatory Visit | Attending: Cardiology | Admitting: Cardiology

## 2017-08-14 DIAGNOSIS — Z95811 Presence of heart assist device: Secondary | ICD-10-CM

## 2017-08-14 DIAGNOSIS — Z7901 Long term (current) use of anticoagulants: Secondary | ICD-10-CM | POA: Diagnosis not present

## 2017-08-14 LAB — PROTIME-INR
INR: 2.03
PROTHROMBIN TIME: 22.8 s — AB (ref 11.4–15.2)

## 2017-08-14 MED ORDER — OCTREOTIDE ACETATE 20 MG IM KIT
20.0000 mg | PACK | INTRAMUSCULAR | Status: DC
  Administered 2017-08-14: 20 mg via INTRAMUSCULAR
  Filled 2017-08-14: qty 1

## 2017-08-14 MED ORDER — OCTREOTIDE ACETATE 20 MG IM KIT: 20.0000 mg | PACK | INTRAMUSCULAR | Status: DC

## 2017-08-14 MED ORDER — TRAMADOL HCL 50 MG PO TABS: ORAL_TABLET | ORAL | 3 refills | Status: DC

## 2017-08-14 NOTE — Progress Notes (Signed)
Patient presents to clinic today for drive line exit wound care. Existing VAD dressing removed and site care performed using sterile technique. Drive line exit site cleaned with Chlora prep applicators x 2, allowed to dry, and Sorbaview dressing with bio patch re-applied. Exit site healed and incorporated, the velour is fully implanted at exit site. No redness, tenderness, drainage, foul odor or rash noted. Drive line anchor re-applied. Pt denies fever or chills.   Return in 1 week for another dressing change per standard of care. INR to be repeated in 1-2 weeks pending results per anticoagulation protocol.    [name], VAD Coordinator 24/7 pager [phone]

## 2017-08-17 NOTE — Telephone Encounter (Signed)
Ok with me 

## 2017-08-18 NOTE — Telephone Encounter (Signed)
Got patient scheduled

## 2017-08-21 ENCOUNTER — Ambulatory Visit (HOSPITAL_COMMUNITY): Payer: Self-pay | Admitting: Pharmacist

## 2017-08-21 ENCOUNTER — Ambulatory Visit (HOSPITAL_COMMUNITY)
Admission: RE | Admit: 2017-08-21 | Discharge: 2017-08-21 | Disposition: A | Discharge status: Home / Self Care | Payer: Medicare Other | Source: Ambulatory Visit | Attending: Cardiology | Admitting: Cardiology

## 2017-08-21 DIAGNOSIS — Z95811 Presence of heart assist device: Secondary | ICD-10-CM

## 2017-08-21 DIAGNOSIS — Z7901 Long term (current) use of anticoagulants: Secondary | ICD-10-CM

## 2017-08-21 LAB — PROTIME-INR
INR: 3.4
Prothrombin Time: 34.1 seconds — ABNORMAL HIGH (ref 11.4–15.2)

## 2017-08-21 NOTE — Progress Notes (Addendum)
Patient presents to clinic today for drive line exit wound care. Existing VAD dressing removed and site care performed using sterile technique. Drive line exit site cleaned with Chlora prep applicators x 2, allowed to dry, and Sorbaview dressing with bio patch re-applied. Exit site healed and incorporated, the velour is fully implanted at exit site. No redness, tenderness, drainage, foul odor or rash noted. Drive line anchor re-applied. Pt denies fever or chills.   Pt brought home INR machine in to check with blood draw. Machine malfunctioned several times, Cloyde Reams called the company for troubleshooting options. After several attempts to troubleshoot the pt decided that he no longer would like to be a part of the [facility] machine check from home. Orders sent to the compnany to d/c orders for home INR machine.  Called the dialysis center today to ask about labs for [name] and how often they are drawn. Per charge nurse they draw labs once a month. If an INR was drawn there on Monday the results would not be to [place] until Wednesday.  Return in 1 week for another dressing change per standard of care. INR to be repeated in 1-2 weeks pending results per anticoagulation protocol.    [name], VAD Coordinator 24/7 pager [phone]

## 2017-08-22 ENCOUNTER — Other Ambulatory Visit: Payer: Self-pay | Admitting: Unknown Physician Specialty

## 2017-08-22 DIAGNOSIS — Z95811 Presence of heart assist device: Secondary | ICD-10-CM

## 2017-08-22 DIAGNOSIS — Z7901 Long term (current) use of anticoagulants: Secondary | ICD-10-CM

## 2017-08-25 ENCOUNTER — Other Ambulatory Visit (HOSPITAL_COMMUNITY): Payer: Self-pay | Admitting: Adult Health

## 2017-08-26 ENCOUNTER — Ambulatory Visit (HOSPITAL_COMMUNITY)
Admission: RE | Admit: 2017-08-26 | Discharge: 2017-08-26 | Disposition: A | Discharge status: Home / Self Care | Payer: Medicare Other | Source: Ambulatory Visit | Attending: Cardiology | Admitting: Cardiology

## 2017-08-26 ENCOUNTER — Ambulatory Visit (HOSPITAL_COMMUNITY): Payer: Self-pay | Admitting: Pharmacist

## 2017-08-26 DIAGNOSIS — Z7901 Long term (current) use of anticoagulants: Secondary | ICD-10-CM | POA: Diagnosis present

## 2017-08-26 DIAGNOSIS — Z95811 Presence of heart assist device: Secondary | ICD-10-CM | POA: Diagnosis not present

## 2017-08-26 LAB — PROTIME-INR
INR: 2.36
PROTHROMBIN TIME: 25.6 s — AB (ref 11.4–15.2)

## 2017-08-26 NOTE — Progress Notes (Signed)
Patient presents to clinic today for drive line exit wound care. Existing VAD dressing removed and site care performed using sterile technique. Drive line exit site cleaned with Chlora prep applicators x 2, allowed to dry, and Sorbaview dressing with bio patch re-applied. Exit site healed and incorporated, the velour is fully implanted at exit site. No redness, tenderness, drainage, foul odor or rash noted. Drive line anchor re-applied. Pt denies fever or chills.   Return in 1 week for another dressing change per standard of care. INR to be repeated in 1-2 weeks pending results per anticoagulation protocol.    Shaniqua Guillot RN, VAD Coordinator 24/7 pager [phone]  

## 2017-08-28 ENCOUNTER — Other Ambulatory Visit (HOSPITAL_COMMUNITY): Payer: Self-pay | Admitting: *Deleted

## 2017-08-28 DIAGNOSIS — Z95811 Presence of heart assist device: Secondary | ICD-10-CM

## 2017-09-02 ENCOUNTER — Ambulatory Visit (HOSPITAL_COMMUNITY): Payer: Self-pay | Admitting: Pharmacist

## 2017-09-02 ENCOUNTER — Ambulatory Visit (HOSPITAL_COMMUNITY)
Admission: RE | Admit: 2017-09-02 | Discharge: 2017-09-02 | Disposition: A | Discharge status: Home / Self Care | Payer: Medicare Other | Source: Ambulatory Visit | Attending: Cardiology | Admitting: Cardiology

## 2017-09-02 DIAGNOSIS — Z95811 Presence of heart assist device: Secondary | ICD-10-CM

## 2017-09-02 LAB — PROTIME-INR
INR: 2.63
Prothrombin Time: 27.9 seconds — ABNORMAL HIGH (ref 11.4–15.2)

## 2017-09-02 MED ORDER — TRAZODONE HCL 50 MG PO TABS: 50.0000 mg | ORAL_TABLET | Freq: Every evening | ORAL | 0 refills | Status: DC | PRN

## 2017-09-02 MED ORDER — ATORVASTATIN CALCIUM 40 MG PO TABS: ORAL_TABLET | ORAL | 11 refills | Status: DC

## 2017-09-02 MED ORDER — RENA-VITE PO TABS: 1.0000 | ORAL_TABLET | Freq: Every day | ORAL | 6 refills | Status: DC

## 2017-09-02 NOTE — Progress Notes (Addendum)
Patient presents to clinic today for drive line exit wound care. Existing VAD dressing removed and site care performed using sterile technique. Drive line exit site cleaned with Chlora prep applicators x 2, allowed to dry, and Sorbaview dressing with bio patch re-applied. Exit site healed and incorporated, the velour is fully implanted at exit site. No redness, tenderness, drainage, foul odor or rash noted. Drive line anchor re-applied. Pt denies fever or chills.   Refills sent for trazodone, atorvastatin and rena-vit.  Return in 1 week for another dressing change per standard of care. INR to be repeated in 1-2 weeks pending results per anticoagulation protocol.    [name], VAD Coordinator 24/7 pager [phone]

## 2017-09-02 NOTE — Addendum Note (Signed)
Encounter addended by: Christinia Gully, RN on: 09/02/2017  9:33 AM<BR>    Actions taken: Sign clinical note

## 2017-09-02 NOTE — Addendum Note (Signed)
Encounter addended by: Christinia Gully, RN on: 09/02/2017  9:17 AM<BR>    Actions taken: Order list changed

## 2017-09-03 ENCOUNTER — Ambulatory Visit (INDEPENDENT_AMBULATORY_CARE_PROVIDER_SITE_OTHER): Payer: Medicare Other | Admitting: Internal Medicine

## 2017-09-03 ENCOUNTER — Encounter: Payer: Self-pay | Admitting: Internal Medicine

## 2017-09-03 VITALS — BP 112/80 | HR 56 | Temp 98.7°F | Resp 16 | Ht 65.0 in | Wt 181.0 lb

## 2017-09-03 DIAGNOSIS — E119 Type 2 diabetes mellitus without complications: Secondary | ICD-10-CM | POA: Diagnosis not present

## 2017-09-03 DIAGNOSIS — Z794 Long term (current) use of insulin: Secondary | ICD-10-CM

## 2017-09-03 DIAGNOSIS — I255 Ischemic cardiomyopathy: Secondary | ICD-10-CM | POA: Diagnosis not present

## 2017-09-03 LAB — POCT GLYCOSYLATED HEMOGLOBIN (HGB A1C): Hemoglobin A1C: 7.5

## 2017-09-03 NOTE — Patient Instructions (Signed)
Diabetes Mellitus and Food It is important for you to manage your blood sugar (glucose) level. Your blood glucose level can be greatly affected by what you eat. Eating healthier foods in the appropriate amounts throughout the day at about the same time each day will help you control your blood glucose level. It can also help slow or prevent worsening of your diabetes mellitus. Healthy eating may even help you improve the level of your blood pressure and reach or maintain a healthy weight. General recommendations for healthful eating and cooking habits include:  Eating meals and snacks regularly. Avoid going long periods of time without eating to lose weight.  Eating a diet that consists mainly of plant-based foods, such as fruits, vegetables, nuts, legumes, and whole grains.  Using low-heat cooking methods, such as baking, instead of high-heat cooking methods, such as deep frying.  Work with your dietitian to make sure you understand how to use the Nutrition Facts information on food labels. How can food affect me? Carbohydrates Carbohydrates affect your blood glucose level more than any other type of food. Your dietitian will help you determine how many carbohydrates to eat at each meal and teach you how to count carbohydrates. Counting carbohydrates is important to keep your blood glucose at a healthy level, especially if you are using insulin or taking certain medicines for diabetes mellitus. Alcohol Alcohol can cause sudden decreases in blood glucose (hypoglycemia), especially if you use insulin or take certain medicines for diabetes mellitus. Hypoglycemia can be a life-threatening condition. Symptoms of hypoglycemia (sleepiness, dizziness, and disorientation) are similar to symptoms of having too much alcohol. If your health care provider has given you approval to drink alcohol, do so in moderation and use the following guidelines:  Women should not have more than one drink per day, and men  should not have more than two drinks per day. One drink is equal to: ? 12 oz of beer. ? 5 oz of wine. ? 1 oz of hard liquor.  Do not drink on an empty stomach.  Keep yourself hydrated. Have water, diet soda, or unsweetened iced tea.  Regular soda, juice, and other mixers might contain a lot of carbohydrates and should be counted.  What foods are not recommended? As you make food choices, it is important to remember that all foods are not the same. Some foods have fewer nutrients per serving than other foods, even though they might have the same number of calories or carbohydrates. It is difficult to get your body what it needs when you eat foods with fewer nutrients. Examples of foods that you should avoid that are high in calories and carbohydrates but low in nutrients include:  Trans fats (most processed foods list trans fats on the Nutrition Facts label).  Regular soda.  Juice.  Candy.  Sweets, such as cake, pie, doughnuts, and cookies.  Fried foods.  What foods can I eat? Eat nutrient-rich foods, which will nourish your body and keep you healthy. The food you should eat also will depend on several factors, including:  The calories you need.  The medicines you take.  Your weight.  Your blood glucose level.  Your blood pressure level.  Your cholesterol level.  You should eat a variety of foods, including:  Protein. ? Lean cuts of meat. ? Proteins low in saturated fats, such as fish, egg whites, and beans. Avoid processed meats.  Fruits and vegetables. ? Fruits and vegetables that may help control blood glucose levels, such as apples,   mangoes, and yams.  Dairy products. ? Choose fat-free or low-fat dairy products, such as milk, yogurt, and cheese.  Grains, bread, pasta, and rice. ? Choose whole grain products, such as multigrain bread, whole oats, and brown rice. These foods may help control blood pressure.  Fats. ? Foods containing healthful fats, such as  nuts, avocado, olive oil, canola oil, and fish.  Does everyone with diabetes mellitus have the same meal plan? Because every person with diabetes mellitus is different, there is not one meal plan that works for everyone. It is very important that you meet with a dietitian who will help you create a meal plan that is just right for you. This information is not intended to replace advice given to you by your health care provider. Make sure you discuss any questions you have with your health care provider. Document Released: 07/18/2005 Document Revised: 03/28/2016 Document Reviewed: 09/17/2013 Else[employer] Interactive Patient Education  2017 [employer]  

## 2017-09-03 NOTE — Progress Notes (Signed)
Subjective:  Patient ID: Trevor [name], male    DOB: [birth year]  Age: 71 y.o. MRN: [id number]  CC: Diabetes   HPI Trevor [name] presents for establishing as a new patient.  He is on hemodialysis and has an LVAD.  He is treating his type 2 diabetes mellitus with insulin.  He thinks his blood sugars have been well controlled.  He's had no recent episodes of polyuria, polydipsia, or polyphagia.  Outpatient Medications Prior to Visit  Medication Sig Dispense Refill  . albuterol (PROVENTIL HFA;VENTOLIN HFA) 108 (90 Base) MCG/ACT inhaler Inhale 2 puffs into the lungs every 4 (four) hours as needed for wheezing or shortness of breath. 1 Inhaler 5  . albuterol (PROVENTIL) (2.5 MG/3ML) 0.083% nebulizer solution Take 3 mLs (2.5 mg total) by nebulization every 4 (four) hours. And as needed 150 mL 5  . atorvastatin (LIPITOR) 40 MG tablet TAKE 1 TABLET(40 MG) BY MOUTH DAILY 30 tablet 11  . busPIRone (BUSPAR) 5 MG tablet Take 1 tablet (5 mg total) by mouth 2 (two) times daily. 60 tablet 6  . calcitRIOL (ROCALTROL) 0.5 MCG capsule Take 1 capsule (0.5 mcg total) by mouth every other day. 15 capsule 6  . docusate sodium (COLACE) 100 MG capsule Take 1 capsule (100 mg total) by mouth 2 (two) times daily. 60 capsule 12  . ferric citrate (AURYXIA) 1 GM 210 MG(Fe) tablet Take 420 mg by mouth 3 (three) times daily with meals.    . fluticasone (FLOVENT HFA) 110 MCG/ACT inhaler Inhale 2 puffs into the lungs 2 (two) times daily. 1 Inhaler 5  . gabapentin (NEURONTIN) 600 MG tablet Take 0.5 tablets (300 mg total) by mouth 2 (two) times daily.    . insulin glargine (LANTUS) 100 unit/mL SOPN Inject 0.2 mLs (20 Units total) into the skin at bedtime. 15 mL 0  . levothyroxine (SYNTHROID, LEVOTHROID) 25 MCG tablet Take 1 tablet (25 mcg total) by mouth daily before breakfast. 30 tablet 6  . multivitamin (RENA-VIT) TABS tablet Take 1 tablet by mouth at bedtime. 30 tablet 6  . pantoprazole (PROTONIX) 40 MG tablet Take 1  tablet (40 mg total) by mouth daily. 30 tablet 5  . sildenafil (REVATIO) 20 MG tablet Take 2 tablets (40 mg total) by mouth 3 (three) times daily. 180 tablet 6  . traMADol (ULTRAM) 50 MG tablet TAKE 1 TABLET BY MOUTH EVERY 6 HOURS AS NEEDED FOR MODERATE PAIN 30 tablet 3  . traZODone (DESYREL) 50 MG tablet Take 1 tablet (50 mg total) by mouth at bedtime as needed for sleep. 90 tablet 0  . warfarin (COUMADIN) 5 MG tablet Take 7.5 mg (1 and 1/2 tablets) daily except 10 mg (2 tablets) on Tues/Thurs.    . furosemide (LASIX) 80 MG tablet Take 2 tablets (160 mg total) by mouth 2 (two) times daily. (Patient not taking: Reported on 07/31/2017) 120 tablet 6   Facility-Administered Medications Prior to Visit  Medication Dose Route Frequency Provider Last Rate Last Dose  . octreotide (SANDOSTATIN LAR) IM injection 10 mg  10 mg Intramuscular Q30 days Larey Dresser, MD   10 mg at 07/15/17 1058  . octreotide (SANDOSTATIN LAR) IM injection 20 mg  20 mg Intramuscular Q28 days Larey Dresser, MD        ROS Review of Systems  Constitutional: Negative.  Negative for appetite change, chills, diaphoresis and fatigue.  HENT: Negative.  Negative for trouble swallowing.   Eyes: Negative.  Negative for visual disturbance.  Respiratory:  Negative for cough, chest tightness, shortness of breath and wheezing.   Cardiovascular: Negative.  Negative for chest pain, palpitations and leg swelling.  Gastrointestinal: Negative for abdominal pain, constipation, diarrhea, nausea and vomiting.  Endocrine: Negative.  Negative for polydipsia, polyphagia and polyuria.  Genitourinary: Negative.  Negative for difficulty urinating.  Musculoskeletal: Negative.   Skin: Negative.   Allergic/Immunologic: Negative.   Neurological: Negative.  Negative for dizziness.  Hematological: Negative for adenopathy. Does not bruise/bleed easily.  Psychiatric/Behavioral: Negative.     Objective:  BP 112/80 (BP Location: Left Arm, Patient  Position: Sitting, Cuff Size: Normal)   Pulse (!) 56   Temp 98.7 F (37.1 C) (Oral)   Resp 16   Ht 5\' 5"  (1.651 m)   Wt 181 lb (82.1 kg)   SpO2 97%   BMI 30.12 kg/m   BP Readings from Last 3 Encounters:  09/03/17 112/80  08/14/17 103/71  08/05/17 (!) 116/93    Wt Readings from Last 3 Encounters:  09/03/17 181 lb (82.1 kg)  08/08/17 174 lb (78.9 kg)  08/05/17 180 lb 6.4 oz (81.8 kg)    Physical Exam  Constitutional: He is oriented to person, place, and time. No distress.  HENT:  Mouth/Throat: Oropharynx is clear and moist. No oropharyngeal exudate.  Eyes: Conjunctivae are normal. Right eye exhibits no discharge. Left eye exhibits no discharge. No scleral icterus.  Neck: Normal range of motion. Neck supple. No JVD present. No thyromegaly present.  Cardiovascular: Normal rate, regular rhythm and intact distal pulses.  Exam reveals no gallop and no friction rub.   No murmur heard. ++ LVAD hum  Pulmonary/Chest: Effort normal and breath sounds normal. No respiratory distress. He has no wheezes. He has no rales. He exhibits no tenderness.  Abdominal: Soft. Bowel sounds are normal. He exhibits no distension and no mass. There is no tenderness. There is no rebound and no guarding.  Musculoskeletal: Normal range of motion. He exhibits no edema, tenderness or deformity.  Lymphadenopathy:    He has no cervical adenopathy.  Neurological: He is alert and oriented to person, place, and time.  Skin: Skin is warm and dry. No rash noted. He is not diaphoretic. No erythema. No pallor.  Vitals reviewed.   Lab Results  Component Value Date   WBC 9.8 08/05/2017   HGB 9.9 (L) 08/05/2017   HCT 31.3 (L) 08/05/2017   PLT 146 (L) 08/05/2017   GLUCOSE 218 (H) 08/05/2017   CHOL 59 12/04/2016   TRIG 68 12/04/2016   HDL 30 (L) 12/04/2016   LDLCALC 15 12/04/2016   ALT 19 07/31/2017   AST 30 07/31/2017   NA 136 08/05/2017   K 3.8 08/05/2017   CL 95 (L) 08/05/2017   CREATININE 4.27 (H)  08/05/2017   BUN 24 (H) 08/05/2017   CO2 32 08/05/2017   TSH 0.694 06/09/2017   PSA 0.60 12/04/2016   INR 2.63 09/02/2017   HGBA1C 7.5 09/03/2017    No results found.  Assessment & Plan:   Trevor [name] was seen today for diabetes.  Diagnoses and all orders for this visit:  Type 2 diabetes mellitus without complication, with long-term current use of insulin ([place])- his A1c is at 7.5%.  Considering his age and comorbid illnesses this is adequately well controlled.  He will continue the current dose of basal insulin. -     POCT glycosylated hemoglobin (Hb A1C)   I have discontinued Trevor [name]'s furosemide. I am also having him maintain his albuterol, fluticasone, albuterol, levothyroxine, busPIRone,  insulin glargine, sildenafil, docusate sodium, calcitRIOL, warfarin, gabapentin, pantoprazole, traMADol, ferric citrate, traZODone, atorvastatin, and multivitamin. We will continue to administer octreotide and octreotide.  No orders of the defined types were placed in this encounter.    Follow-up: Return in about 6 months (around 03/03/2018).  Scarlette Calico, MD

## 2017-09-08 ENCOUNTER — Ambulatory Visit (HOSPITAL_BASED_OUTPATIENT_CLINIC_OR_DEPARTMENT_OTHER)
Admission: RE | Admit: 2017-09-08 | Discharge: 2017-09-08 | Disposition: A | Discharge status: Home / Self Care | Payer: Medicare Other | Source: Ambulatory Visit | Attending: Cardiology | Admitting: Cardiology

## 2017-09-08 ENCOUNTER — Encounter (HOSPITAL_COMMUNITY): Payer: Self-pay

## 2017-09-08 ENCOUNTER — Inpatient Hospital Stay (HOSPITAL_COMMUNITY)
Admission: AD | Admit: 2017-09-08 | Discharge: 2017-09-09 | DRG: 291 | Disposition: A | Discharge status: Home / Self Care | Payer: Medicare Other | Source: Ambulatory Visit | Attending: Cardiology | Admitting: Cardiology

## 2017-09-08 ENCOUNTER — Telehealth (HOSPITAL_COMMUNITY): Payer: Self-pay | Admitting: *Deleted

## 2017-09-08 ENCOUNTER — Ambulatory Visit (HOSPITAL_COMMUNITY)
Admission: RE | Admit: 2017-09-08 | Discharge: 2017-09-08 | Disposition: A | Discharge status: Home / Self Care | Payer: Medicare Other | Source: Ambulatory Visit | Attending: Cardiology | Admitting: Cardiology

## 2017-09-08 ENCOUNTER — Other Ambulatory Visit: Payer: Self-pay

## 2017-09-08 VITALS — BP 107/56 | HR 61 | Temp 98.3°F | Resp 16 | Ht 65.0 in | Wt 186.4 lb

## 2017-09-08 DIAGNOSIS — I255 Ischemic cardiomyopathy: Secondary | ICD-10-CM | POA: Diagnosis present

## 2017-09-08 DIAGNOSIS — Z79899 Other long term (current) drug therapy: Secondary | ICD-10-CM

## 2017-09-08 DIAGNOSIS — K5520 Angiodysplasia of colon without hemorrhage: Secondary | ICD-10-CM | POA: Diagnosis present

## 2017-09-08 DIAGNOSIS — I5023 Acute on chronic systolic (congestive) heart failure: Secondary | ICD-10-CM | POA: Diagnosis present

## 2017-09-08 DIAGNOSIS — Z8249 Family history of ischemic heart disease and other diseases of the circulatory system: Secondary | ICD-10-CM | POA: Diagnosis not present

## 2017-09-08 DIAGNOSIS — E8889 Other specified metabolic disorders: Secondary | ICD-10-CM | POA: Diagnosis present

## 2017-09-08 DIAGNOSIS — I5022 Chronic systolic (congestive) heart failure: Secondary | ICD-10-CM | POA: Diagnosis present

## 2017-09-08 DIAGNOSIS — Z79890 Hormone replacement therapy (postmenopausal): Secondary | ICD-10-CM | POA: Diagnosis not present

## 2017-09-08 DIAGNOSIS — Z992 Dependence on renal dialysis: Secondary | ICD-10-CM | POA: Diagnosis not present

## 2017-09-08 DIAGNOSIS — I482 Chronic atrial fibrillation: Secondary | ICD-10-CM | POA: Diagnosis present

## 2017-09-08 DIAGNOSIS — I480 Paroxysmal atrial fibrillation: Secondary | ICD-10-CM | POA: Diagnosis present

## 2017-09-08 DIAGNOSIS — J069 Acute upper respiratory infection, unspecified: Secondary | ICD-10-CM | POA: Diagnosis present

## 2017-09-08 DIAGNOSIS — E871 Hypo-osmolality and hyponatremia: Secondary | ICD-10-CM | POA: Diagnosis present

## 2017-09-08 DIAGNOSIS — C4440 Unspecified malignant neoplasm of skin of scalp and neck: Secondary | ICD-10-CM | POA: Diagnosis present

## 2017-09-08 DIAGNOSIS — Z95810 Presence of automatic (implantable) cardiac defibrillator: Secondary | ICD-10-CM

## 2017-09-08 DIAGNOSIS — Z95811 Presence of heart assist device: Secondary | ICD-10-CM

## 2017-09-08 DIAGNOSIS — Z951 Presence of aortocoronary bypass graft: Secondary | ICD-10-CM | POA: Diagnosis not present

## 2017-09-08 DIAGNOSIS — E1122 Type 2 diabetes mellitus with diabetic chronic kidney disease: Secondary | ICD-10-CM | POA: Diagnosis present

## 2017-09-08 DIAGNOSIS — N186 End stage renal disease: Secondary | ICD-10-CM

## 2017-09-08 DIAGNOSIS — I132 Hypertensive heart and chronic kidney disease with heart failure and with stage 5 chronic kidney disease, or end stage renal disease: Secondary | ICD-10-CM | POA: Diagnosis present

## 2017-09-08 DIAGNOSIS — J45909 Unspecified asthma, uncomplicated: Secondary | ICD-10-CM | POA: Diagnosis present

## 2017-09-08 DIAGNOSIS — Z7901 Long term (current) use of anticoagulants: Secondary | ICD-10-CM

## 2017-09-08 DIAGNOSIS — Z833 Family history of diabetes mellitus: Secondary | ICD-10-CM | POA: Diagnosis not present

## 2017-09-08 DIAGNOSIS — G4733 Obstructive sleep apnea (adult) (pediatric): Secondary | ICD-10-CM | POA: Diagnosis present

## 2017-09-08 DIAGNOSIS — L409 Psoriasis, unspecified: Secondary | ICD-10-CM | POA: Diagnosis present

## 2017-09-08 DIAGNOSIS — I2510 Atherosclerotic heart disease of native coronary artery without angina pectoris: Secondary | ICD-10-CM | POA: Diagnosis present

## 2017-09-08 DIAGNOSIS — D631 Anemia in chronic kidney disease: Secondary | ICD-10-CM | POA: Diagnosis present

## 2017-09-08 DIAGNOSIS — Z5181 Encounter for therapeutic drug level monitoring: Secondary | ICD-10-CM

## 2017-09-08 DIAGNOSIS — Z794 Long term (current) use of insulin: Secondary | ICD-10-CM | POA: Diagnosis not present

## 2017-09-08 DIAGNOSIS — I50810 Right heart failure, unspecified: Secondary | ICD-10-CM

## 2017-09-08 DIAGNOSIS — Z952 Presence of prosthetic heart valve: Secondary | ICD-10-CM

## 2017-09-08 DIAGNOSIS — I509 Heart failure, unspecified: Secondary | ICD-10-CM

## 2017-09-08 LAB — GLUCOSE, CAPILLARY
GLUCOSE-CAPILLARY: 114 mg/dL — AB (ref 65–99)
Glucose-Capillary: 235 mg/dL — ABNORMAL HIGH (ref 65–99)

## 2017-09-08 LAB — COMPREHENSIVE METABOLIC PANEL
ALBUMIN: 3.9 g/dL (ref 3.5–5.0)
ALK PHOS: 119 U/L (ref 38–126)
ALT: 31 U/L (ref 17–63)
ANION GAP: 8 (ref 5–15)
AST: 41 U/L (ref 15–41)
BUN: 8 mg/dL (ref 6–20)
CALCIUM: 8.3 mg/dL — AB (ref 8.9–10.3)
CO2: 32 mmol/L (ref 22–32)
Chloride: 97 mmol/L — ABNORMAL LOW (ref 101–111)
Creatinine, Ser: 1.93 mg/dL — ABNORMAL HIGH (ref 0.61–1.24)
GFR calc Af Amer: 39 mL/min — ABNORMAL LOW (ref >60)
GFR calc non Af Amer: 33 mL/min — ABNORMAL LOW (ref >60)
GLUCOSE: 73 mg/dL (ref 65–99)
Potassium: 3.4 mmol/L — ABNORMAL LOW (ref 3.5–5.1)
SODIUM: 137 mmol/L (ref 135–145)
Total Bilirubin: 1 mg/dL (ref 0.3–1.2)
Total Protein: 7.4 g/dL (ref 6.5–8.1)

## 2017-09-08 LAB — CBC
HCT: 33.4 % — ABNORMAL LOW (ref 39.0–52.0)
Hemoglobin: 10.7 g/dL — ABNORMAL LOW (ref 13.0–17.0)
MCH: 30.1 pg (ref 26.0–34.0)
MCHC: 32 g/dL (ref 30.0–36.0)
MCV: 94.1 fL (ref 78.0–100.0)
Platelets: 113 10*3/uL — ABNORMAL LOW (ref 150–400)
RBC: 3.55 MIL/uL — ABNORMAL LOW (ref 4.22–5.81)
RDW: 18.1 % — ABNORMAL HIGH (ref 11.5–15.5)
WBC: 10.9 10*3/uL — ABNORMAL HIGH (ref 4.0–10.5)

## 2017-09-08 LAB — PROTIME-INR
INR: 2.95
Prothrombin Time: 30.5 seconds — ABNORMAL HIGH (ref 11.4–15.2)

## 2017-09-08 LAB — LACTATE DEHYDROGENASE: LDH: 292 U/L — AB (ref 98–192)

## 2017-09-08 MED ORDER — ACETAMINOPHEN 325 MG PO TABS: 650.0000 mg | ORAL_TABLET | ORAL | Status: DC | PRN

## 2017-09-08 MED ORDER — ATORVASTATIN CALCIUM 40 MG PO TABS: 40.0000 mg | ORAL_TABLET | Freq: Every day | ORAL | Status: DC

## 2017-09-08 MED ORDER — ONDANSETRON HCL 4 MG/2ML IJ SOLN: 4.0000 mg | Freq: Four times a day (QID) | INTRAMUSCULAR | Status: DC | PRN

## 2017-09-08 MED ORDER — SILDENAFIL CITRATE 20 MG PO TABS
40.0000 mg | ORAL_TABLET | Freq: Three times a day (TID) | ORAL | Status: DC
  Administered 2017-09-08 – 2017-09-09 (×3): 40 mg via ORAL
  Filled 2017-09-08 (×3): qty 2

## 2017-09-08 MED ORDER — LEVOTHYROXINE SODIUM 25 MCG PO TABS
25.0000 ug | ORAL_TABLET | Freq: Every day | ORAL | Status: DC
  Administered 2017-09-09: 25 ug via ORAL
  Filled 2017-09-08: qty 1

## 2017-09-08 MED ORDER — INSULIN ASPART 100 UNIT/ML ~~LOC~~ SOLN
0.0000 [IU] | Freq: Three times a day (TID) | SUBCUTANEOUS | Status: DC
  Administered 2017-09-09: 2 [IU] via SUBCUTANEOUS

## 2017-09-08 MED ORDER — INSULIN GLARGINE 100 UNIT/ML ~~LOC~~ SOLN
20.0000 [IU] | Freq: Every day | SUBCUTANEOUS | Status: DC
  Administered 2017-09-08: 20 [IU] via SUBCUTANEOUS
  Filled 2017-09-08 (×2): qty 0.2

## 2017-09-08 MED ORDER — INSULIN GLARGINE 100 UNITS/ML SOLOSTAR PEN
20.0000 [IU] | PEN_INJECTOR | Freq: Every day | SUBCUTANEOUS | Status: DC
  Filled 2017-09-08: qty 3

## 2017-09-08 MED ORDER — FERRIC CITRATE 1 GM 210 MG(FE) PO TABS
420.0000 mg | ORAL_TABLET | Freq: Three times a day (TID) | ORAL | Status: DC
  Administered 2017-09-08 – 2017-09-09 (×2): 420 mg via ORAL
  Filled 2017-09-08 (×4): qty 2

## 2017-09-08 MED ORDER — GABAPENTIN 600 MG PO TABS
300.0000 mg | ORAL_TABLET | Freq: Two times a day (BID) | ORAL | Status: DC
  Administered 2017-09-08 – 2017-09-09 (×2): 300 mg via ORAL
  Filled 2017-09-08 (×2): qty 1

## 2017-09-08 MED ORDER — PANTOPRAZOLE SODIUM 40 MG PO TBEC
40.0000 mg | DELAYED_RELEASE_TABLET | Freq: Every day | ORAL | Status: DC
  Administered 2017-09-09: 40 mg via ORAL
  Filled 2017-09-08: qty 1

## 2017-09-08 MED ORDER — WARFARIN SODIUM 5 MG PO TABS
5.0000 mg | ORAL_TABLET | Freq: Once | ORAL | Status: AC
  Administered 2017-09-08: 5 mg via ORAL
  Filled 2017-09-08: qty 1

## 2017-09-08 MED ORDER — BUDESONIDE 0.25 MG/2ML IN SUSP
0.2500 mg | Freq: Two times a day (BID) | RESPIRATORY_TRACT | Status: DC
  Administered 2017-09-08: 0.25 mg via RESPIRATORY_TRACT
  Filled 2017-09-08 (×2): qty 2

## 2017-09-08 MED ORDER — TRAZODONE HCL 50 MG PO TABS
50.0000 mg | ORAL_TABLET | Freq: Every evening | ORAL | Status: DC | PRN
  Administered 2017-09-08: 50 mg via ORAL
  Filled 2017-09-08: qty 1

## 2017-09-08 MED ORDER — ALBUTEROL SULFATE (2.5 MG/3ML) 0.083% IN NEBU: 2.5000 mg | INHALATION_SOLUTION | RESPIRATORY_TRACT | Status: DC | PRN

## 2017-09-08 MED ORDER — BUSPIRONE HCL 5 MG PO TABS
5.0000 mg | ORAL_TABLET | Freq: Two times a day (BID) | ORAL | Status: DC
  Administered 2017-09-08 – 2017-09-09 (×2): 5 mg via ORAL
  Filled 2017-09-08 (×2): qty 1

## 2017-09-08 MED ORDER — DOCUSATE SODIUM 100 MG PO CAPS
100.0000 mg | ORAL_CAPSULE | Freq: Two times a day (BID) | ORAL | Status: DC
  Administered 2017-09-09: 100 mg via ORAL
  Filled 2017-09-08 (×2): qty 1

## 2017-09-08 MED ORDER — RENA-VITE PO TABS
1.0000 | ORAL_TABLET | Freq: Every day | ORAL | Status: DC
  Administered 2017-09-08: 1 via ORAL
  Filled 2017-09-08: qty 1

## 2017-09-08 MED ORDER — OCTREOTIDE ACETATE 20 MG IM KIT: 20.0000 mg | PACK | INTRAMUSCULAR | Status: DC

## 2017-09-08 MED ORDER — TRAMADOL HCL 50 MG PO TABS: 50.0000 mg | ORAL_TABLET | Freq: Four times a day (QID) | ORAL | Status: DC | PRN

## 2017-09-08 MED ORDER — WARFARIN - PHARMACIST DOSING INPATIENT
Freq: Every day | Status: DC
  Administered 2017-09-08: 19:00:00

## 2017-09-08 MED ORDER — CALCITRIOL 0.25 MCG PO CAPS: 0.5000 ug | ORAL_CAPSULE | ORAL | Status: DC

## 2017-09-08 NOTE — Progress Notes (Addendum)
Trevor Hernan Murphyis a 71 y.o.malewith a history of CAD s/p CABG x 4 2010, OSA, AS with TAVR 2015, CKD, DM2, paroxysmal atrial fibrillation, asthma, and ischemic cardiomyopathy with Medtronic ICD.   Relocated to [place] from [place] in early 2018. Over the last couple of years, he had been admitted multiple times for CHF. Last admission in [place] was in 1/18. Sounds like the hospitalization was complicated by cardiorenal syndrome and there was consideration of doing dialysis.  He was admitted in to [facility] 11/27/16 with NYHA class IV symptoms and hypotension. He was placed on dual inotropes to facilitate diuresis, cardiac output and renal function.  CT surgery consulted for mechanical support and he was deemed appropriate for LVAD work up. He completed LVAD work up and was approved for HMII LVAD --> DT. On 12/11/16, he had IABP placed to optimize cardiac output and improve renal function. He then underwent HMII LVAD placement + tricuspid valve repair on 12/14/2015. Post operatively pressors weaned off slowly. He required significant diuresis.  Course was complicated by RV dysfunction and renal dysfunction.  Patient was admitted with lower GI bleeding in 4/18.  He had been on ASA 325 (increased with mild LDH elevation) and warfarin INR 2-2.5.  He had 4 units PRBCs total.  Colonoscopy showed colonic AVM treated with APC and clipping.  He was sent home off ASA and on warfarin INR goal 2-2.5.   In 5/18, creatinine was noted to increase as high as 3.67.  I cut back on his diuretics but he became volume overloaded.  I increased diuretics again with poor response.  In 5/18, he had RHC showing elevated right and left heart filling pressures and preserved cardiac output. I admitted him for IV diuresis.  Weight and creatinine came down, creatinine was 2.9 when discharged.  Ramp echo was done and speed was increased to 9200 rpm.  Sildenafil was increased to 40 mg tid.  At the next visit, speed  was increased to 9400 rpm and torsemide was decreased to 40 mg daily, later increased back to 60 mg daily with increased volume.   He continued to have problems with up and down renal function, requiring up and down adjustment of torsemide.  He finally was admitted to the hospital in 8/18 with intractable volume overload.  Efforts at diuresis were complicated by AKI.  Speed was increased to 9600 rpm in the hospital. He finally had to undergo CVVH, then was started on intermittent HD.  He was discharged home to continue on HD.    He has been noted to have a skin cancer on his neck, will need this removed.  He should be able to continue his coumadin.   Today he returns for an acute visit. He was sent over from HD center. Feels terrible.  Says he is up 6 kg. Had HD today. Drinking lots of fluids. Increased leg edema. SOB with exertion and rest. No fever or chills. Denies BRBPR. Appetite ok. Taking all medications.    Labs (3/18): LDH 331 => 401 => 443 => 366 => 313, K 3.6 => 3.2 => 3.5, creatinine 1.98 => 1.74 => 1.8 => 1.79, hgb 8.3 => 8.9 => 8.7 => 8.4, INR 2.11 => 2.4 Labs (4/18): hgb 9.4 Labs (5/18): LDH 269 => 249, INR 2.57, K 4.5, creatinine 1.92 => 2.71 => 3.67 => 3.43 => 3.1 => 2.93, hgb 9, plts 104 Labs (6/18): K 4.5, creatinine 3.6 => 3.43, hgb 8.5, INR 2.4, LDH 287\  Labs (7/18): K 4.5, creatinine 3.52 => 4, hgb 7.9 => 8.5 Labs (8/18): K 3.8, creatinine 4, hgb 8.5 Labs (9/18): hgb 8.2 => 9 => 9.9, plts 143, LDH 205 => 251    PMH: 1. CAD: s/p CABG in 2010.  2. Aortic stenosis: s/p TAVR in 2015. Valve looked ok on 2/18 echo.  3. Atrial fibrillation: Chronic.  4. Type II diabetes.  5. ESRD  6. Chronic systolic CHF: Ischemic cardiomyopathy with prominent RV dysfunction.  Medtronic ICD.  - Echo (2/18): EF 20-25%, moderately dilated LV, moderately dilated/moderately dysfunctional RV, severe TR.  - Cardiogenic shock 2/18 requiring milrinone, norepinephrine, and IABP.  - Heartmate II LVAD  for DT placed 12/13/16.   - RHC (5/18): mean RA 16, PA 55/22 mean 33, mean PCWP 23, CI 2.59, PVR 3.87 - RHC (8/18): mean RA 17, PA 56/25 mean 37, mean PCWP 20, CI 3.2 Fick/2.1 thermo 7. OSA.  8. Thrombocytopenia: Post-op LVAD, resolved.  9. Tricuspid regurgitation: Severe, s/p repair with LVAD placement in 2/18.  10. Lower GI bleed (4/18): Colonic AVM, treated with APC and clipping.  11. Psoriasis  Social History   Socioeconomic History  . Marital status: Widowed    Spouse name: Not on file  . Number of children: 0  . Years of education: 85  . Highest education level: Not on file  Social Needs  . Financial resource strain: Not on file  . Food insecurity - worry: Not on file  . Food insecurity - inability: Not on file  . Transportation needs - medical: Not on file  . Transportation needs - non-medical: Not on file  Occupational History  . Not on file  Tobacco Use  . Smoking status: Never Smoker  . Smokeless tobacco: Never Used  Substance and Sexual Activity  . Alcohol use: No  . Drug use: No  . Sexual activity: Not Currently  Other Topics Concern  . Not on file  Social History Narrative   Patient is a widow. Moved to [place] from [place], [place]. Currently lives with his sister Trevor [name].    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    Family History  Problem Relation Age of Onset  . Heart failure Father   . Heart attack Father   . Healthy Mother   . Diabetes Paternal Grandfather    ROS: All systems reviewed and negative except as per HPI.   Current Outpatient Medications  Medication Sig Dispense Refill  . albuterol (PROVENTIL HFA;VENTOLIN HFA) 108 (90 Base) MCG/ACT inhaler Inhale 2 puffs into the lungs every 4 (four) hours as needed for wheezing or shortness of breath. 1 Inhaler 5  . albuterol (PROVENTIL) (2.5 MG/3ML) 0.083% nebulizer solution Take 3 mLs (2.5 mg total) by nebulization every 4 (four) hours. And as needed 150 mL 5  . atorvastatin  (LIPITOR) 40 MG tablet TAKE 1 TABLET(40 MG) BY MOUTH DAILY 30 tablet 11  . busPIRone (BUSPAR) 5 MG tablet Take 1 tablet (5 mg total) by mouth 2 (two) times daily. 60 tablet 6  . calcitRIOL (ROCALTROL) 0.5 MCG capsule Take 1 capsule (0.5 mcg total) by mouth every other day. 15 capsule 6  . docusate sodium (COLACE) 100 MG capsule Take 1 capsule (100 mg total) by mouth 2 (two) times daily. 60 capsule 12  . ferric citrate (AURYXIA) 1 GM 210 MG(Fe) tablet Take 420 mg by mouth 3 (three) times daily with meals.    . fluticasone (FLOVENT HFA) 110 MCG/ACT inhaler Inhale 2 puffs  into the lungs 2 (two) times daily. 1 Inhaler 5  . gabapentin (NEURONTIN) 600 MG tablet Take 0.5 tablets (300 mg total) by mouth 2 (two) times daily.    . insulin glargine (LANTUS) 100 unit/mL SOPN Inject 0.2 mLs (20 Units total) into the skin at bedtime. 15 mL 0  . levothyroxine (SYNTHROID, LEVOTHROID) 25 MCG tablet Take 1 tablet (25 mcg total) by mouth daily before breakfast. 30 tablet 6  . multivitamin (RENA-VIT) TABS tablet Take 1 tablet by mouth at bedtime. 30 tablet 6  . pantoprazole (PROTONIX) 40 MG tablet Take 1 tablet (40 mg total) by mouth daily. 30 tablet 5  . sildenafil (REVATIO) 20 MG tablet Take 2 tablets (40 mg total) by mouth 3 (three) times daily. 180 tablet 6  . traMADol (ULTRAM) 50 MG tablet TAKE 1 TABLET BY MOUTH EVERY 6 HOURS AS NEEDED FOR MODERATE PAIN 30 tablet 3  . traZODone (DESYREL) 50 MG tablet Take 1 tablet (50 mg total) by mouth at bedtime as needed for sleep. 90 tablet 0  . warfarin (COUMADIN) 5 MG tablet Take 7.5 mg (1 and 1/2 tablets) daily except 10 mg (2 tablets) on Tues/Thurs.     Current Facility-Administered Medications  Medication Dose Route Frequency Provider Last Rate Last Dose  . octreotide (SANDOSTATIN LAR) IM injection 10 mg  10 mg Intramuscular Q30 days Larey Dresser, MD   10 mg at 07/15/17 1058  . octreotide (SANDOSTATIN LAR) IM injection 20 mg  20 mg Intramuscular Q28 days Larey Dresser, MD       LVAD Parameters: PF 5.2 Speed 9600 PP 6 PI 6.1 No external power 11/3 and 11/5.  BP (!) 107/56   Pulse 61   Temp 98.3 F (36.8 C) (Oral)   Resp 16   Ht 5\' 5"  (1.651 m)   Wt 186 lb 6.4 oz (84.6 kg)   SpO2 99%   BMI 31.02 kg/m   MAP 110  Physical Exam: GENERAL: Appears fatigued. Arrived in a wheel chair.  HEENT: normal  NECK: Supple, JVP to jaw   .  2+ bilaterally, no bruits.  No lymphadenopathy or thyromegaly appreciated.   CARDIAC:  Mechanical heart sounds with LVAD hum present.  LUNGS:  Clear to auscultation bilaterally.  ABDOMEN:  Soft, round, nontender, positive bowel sounds x4.    + distended  LVAD exit site: well-healed and incorporated.  Dressing dry and intact.  No erythema or drainage.  Stabilization device present and accurately applied.  Driveline dressing is being changed daily per sterile technique. EXTREMITIES:  Warm and dry, no cyanosis, clubbing, rash. R and LLE 2+ edema . LUE AVF NEUROLOGIC:  Alert and oriented x 4.  Gait steady.  No aphasia.  No dysarthria.  Affect pleasant.        Assessment/Plan: 1. Acute/Chronic systolic CHF: Ischemic cardiomyopathy, EF 20-25% with RV dysfunction on 2/18 echo pre-LVAD.  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  Medtronic ICD.  Overall, he has felt much better post-LVAD.  He has had problems with RV failure post-op.  He developed worsening renal dysfunction and finally had to start HD in 8/18.  He is tolerating HD so far though he is very fatigued on HD days.   NYHA IV.  Volume status elevated. Had HD today. He is still up 6 kg from baseline. Watch closely with RV failure. -   - Continue warfarin with INR goal 2-2.5, no ASA.   - LDH 292.  2. CAD: s/p CABG.  No chest pain.  Continue statin.   3. ESRD:  Had iHD today but still SOB at rest. Will need to consult nephrology.   Waiting for fistula to mature (may not mature with non-pulsatile flow).  4. Atrial fibrillation: Chronic.  On warfarin.  5. RV failure: Continue Revatio  at 40 mg tid.   6. HTN: Elevated. Will not aggressively lower with iHD.      7. Anemia: Baseline anemia of renal disease/chronic disease but recent bleeding from colonic AVMs. Check CBC today.  Hgb 10.7 8. 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  - Continue octreotide injections.  9. Skin cancer: On neck.  Needs excision, does not have to stop warfarin.   Follow INR daily. Currently 1.93. Hgb stable.  Admit today with marked volume overload. Consult nephrology.  Amy Clegg NP-C  09/08/2017

## 2017-09-08 NOTE — Progress Notes (Signed)
Patient presents for sick visit in [facility] today. Reports no problems with VAD equipment or concerns with drive line. States he feels short of breath with any activity. States he "feels bad" all the time. Spoke with HD RN who states they can only remove 3 liters of fluid per treatment and patient is up 6 KG.  Vital Signs:  Doppler Pressure 110   Automatc BP: 107/56 (71) HR:61   SPO2:99  %  Weight: 186.4 lb w/o eqt Last weight: 178.6 lb Dry weights: 81.5  Kg (179 lbs). Per HD nurse, patient is up 6 kg even after HD this morning.   VAD Indication: Destination therpay    VAD interrogation & Equipment Management: Speed:9600 Flow: 5.2 Power:6.0 w    PI:6.1  Alarms: no clinical alarms Events: 11/5- no external power, 11/3- no external power  Fixed speed 9600 Low speed limit: 9000  Primary Controller:  Replace back up battery in 56months. Back up controller:   Replace back up battery in 13 months.   Exit Site Care: Drive line is being maintained  weekly  by VAD Cooridnators. Drive line exit site well healed and incorporated. The velour is fully implanted at exit site. Dressing dry and intact. No erythema or drainage. Stabilization device present and accurately applied. Pt denies fever or chills. Pt states they have adequate dressing supplies at home.   Significant Events on VAD Support:  02/2017> GIB- AVM clipped x2 07/2017> renal failure- HD started  Device:Medtronic single lead Therapies: on at 231 bpm Last check: 07/31/2017   BP & Labs:  MAP 110 - Doppler is reflecting modified systolic  Hgb 00.1 - No S/S of bleeding. Specifically denies melena/BRBPR or nosebleeds.  LDH stable at 292 with established baseline of 225- 300. Denies tea-colored urine. No power elevations noted on interrogation.   Plan: 1. Patient is being admitted to unit 2 [facility] for volume overload.  Balinda Quails RN [place] Coordinator   Office: [phone] 24/7 Emergency VAD Pager:  [phone]

## 2017-09-08 NOTE — Addendum Note (Signed)
Encounter addended by: Candy Sledge, RN on: 09/08/2017 3:27 PM  Actions taken: Sign clinical note

## 2017-09-08 NOTE — Progress Notes (Signed)
ANTICOAGULATION CONSULT NOTE - Initial Consult  Pharmacy Consult for warfarin Indication: LVAD  No Known Allergies  Patient Measurements: Ht: 5'5" Wt: 84.6 kg  Vital Signs: Temp: 98.3 F (36.8 C) (11/05 1345) Temp Source: Oral (11/05 1345) BP: 107/56 (11/05 1345) Pulse Rate: 61 (11/05 1345)  Labs: Recent Labs    09/08/17 1331  HGB 10.7*  HCT 33.4*  PLT 113*  LABPROT 30.5*  INR 2.95  CREATININE 1.93*    Estimated Creatinine Clearance: 35.1 mL/min (A) (by C-G formula based on SCr of 1.93 mg/dL (H)).   Medical History: Past Medical History:  Diagnosis Date  . AICD (automatic cardioverter/defibrillator) present   . Asthma   . AVM (arteriovenous malformation) of colon 07/07/2017  . CHF (congestive heart failure) ([place])   . Diabetes ([place])   . ESRF (end stage renal failure) ([place]) 07/07/2017  . Kidney disease   . Permanent atrial fibrillation ([place]) 07/07/2017  . Presence of permanent cardiac pacemaker    AICD  . Sleep apnea     Medications:  Scheduled:  . albuterol  2.5 mg Nebulization Q4H  . atorvastatin  40 mg Oral q1800  . budesonide  0.25 mg Nebulization BID  . busPIRone  5 mg Oral BID  . calcitRIOL  0.5 mcg Oral QODAY  . docusate sodium  100 mg Oral BID  . ferric citrate  420 mg Oral TID WC  . gabapentin  300 mg Oral BID  . insulin aspart  0-15 Units Subcutaneous TID WC  . insulin glargine  20 Units Subcutaneous QHS  . [START ON 09/09/2017] levothyroxine  25 mcg Oral QAC breakfast  . multivitamin  1 tablet Oral QHS  . [START ON 09/11/2017] octreotide  20 mg Intramuscular Q28 days  . pantoprazole  40 mg Oral Daily  . sildenafil  40 mg Oral TID    Assessment: 23 yom with history of LVAD and atrial fibrillation admitted for CHF with elevated volume status. Last appointment was on 10/30 with home regimen of 7.5 mg daily except 10 mg on Tuesday and Thursday.   INR today is supratherapeutic at 2.95. Goal range is 2-2.5. Hgb is 10.7, with platelets at 113. Last  dose of warfarin was last night (11/4). No signs/symptoms of bleeding noted in the chart. LDH is 292. No new medication interactions.   Goal of Therapy:  INR 2-2.5 Monitor platelets by anticoagulation protocol: Yes   Plan:  Order warfarin 5 mg tonight Monitor daily INR and CBC Monitor for signs/symptoms of bleeding   Doylene Canard, PharmD Clinical Pharmacist  Pager: [phone] Phone: [phone]

## 2017-09-08 NOTE — Telephone Encounter (Signed)
[facility] nurse called VAD pager this am to report pt has wt gain over weekend. Dry wt 80 kgs; pt weighing 90 kg today with c/o SOB, can't lie flat and breathe. She says she can't pull more than 3 kg and was wondering if patient should be admitted. [name] updated - informed nurse to pull as much fluid as possible today and we will see patient in VAD clinic tomorrow. Nurse verbalized understanding of same.

## 2017-09-08 NOTE — H&P (Signed)
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 Started [facility] 06/2017. Also has skin cancer on his neck.   GI events  02/2017 - Colonic AVM treated with APC and clipping.   He came in for an acute visit, sent over from outpatient HD.  Weight up 6 kg, gradual rise over time.  Worsening dyspnea x 1 week, +orthopnea. Has "dry mouth" so suspect getting in excessive fluid.  No BRBPR/melena.  No lightheadedness.    LVAD INTERROGATION:  HeartMate II LVAD:  Flow 5.2 liters/min, speed 9600, power 6  PI 6.1     Review of Systems: [y] = yes, _0  = no   General: Weight gain [ Y]; Weight loss _1 ; Anorexia _2 ; Fatigue [Y ]; Fever _3 ; Chills _4 ; Weakness [Y ]  Cardiac: Chest pain/pressure _5 ; Resting SOB [Y ]; Exertional SOB [Y ]; Orthopnea [Y ]; Pedal Edema [ Y]; Palpitations _6 ; Syncope _7 ; Presyncope _8 ; Paroxysmal nocturnal dyspnea_9   Pulmonary: Cough _10 ; Wheezing_11 ; Hemoptysis_12 ; Sputum _13 ; Snoring _14   GI: Vomiting_15 ; Dysphagia_16 ; Melena_17 ; Hematochezia _18 ; Heartburn_19 ; Abdominal pain _20 ; Constipation _21 ; Diarrhea _22 ; BRBPR _23   GU: Hematuria_24 ; Dysuria _25 ; Nocturia_26   Vascular: Pain in legs with walking _27 ; Pain in feet with lying flat _28 ; Non-healing sores _29 ; Stroke _30 ; TIA _31 ; Slurred speech _32 ;  Neuro: Headaches_33 ; Vertigo_34 ; Seizures_35 ; Paresthesias_36 ;Blurred vision _37 ; Diplopia _38 ; Vision changes _39   Ortho/Skin: Arthritis _40 ; Joint pain [Y ]; Muscle pain _41 ; Joint swelling _42 ; Back Pain [Y ]; Rash _43   Psych: Depression_44 ; Anxiety_45   Heme: Bleeding problems _46 ; Clotting disorders _47 ; Anemia [Y ]  Endocrine: Diabetes _48 ; Thyroid dysfunction[Y ]    Home Medications Prior  to Admission medications   Medication Sig Start Date End Date Taking? Authorizing Provider  albuterol (PROVENTIL HFA;VENTOLIN HFA) 108 (90 Base) MCG/ACT inhaler Inhale 2 puffs into the lungs every 4 (four) hours as needed for wheezing or shortness of breath. 11/27/16   Byrum, Rose Fillers, MD  albuterol (PROVENTIL) (2.5 MG/3ML) 0.083% nebulizer solution Take 3 mLs (2.5 mg total) by nebulization every 4 (four) hours. And as needed 11/27/16   [name], MD  atorvastatin (LIPITOR) 40 MG tablet TAKE 1 TABLET(40 MG) BY MOUTH DAILY 09/02/17   Larey Dresser, MD  busPIRone (BUSPAR) 5 MG tablet Take 1 tablet (5 mg total) by mouth 2 (two) times daily. 01/01/17   Clegg, Amy D, NP  calcitRIOL (ROCALTROL) 0.5 MCG capsule Take 1 capsule (0.5 mcg total) by mouth every other day. 07/08/17   Bensimhon, Shaune Pascal, MD  docusate sodium (COLACE) 100 MG capsule Take 1 capsule (100 mg total) by mouth 2 (two) times daily. 04/03/17   Larey Dresser, MD  ferric citrate (AURYXIA) 1 GM 210 MG(Fe) tablet Take 420 mg by mouth 3 (three) times daily with meals.    [provider]  fluticasone (FLOVENT HFA) 110 MCG/ACT inhaler Inhale 2  puffs into the lungs 2 (two) times daily. 11/27/16   [name], MD  gabapentin (NEURONTIN) 600 MG tablet Take 0.5 tablets (300 mg total) by mouth 2 (two) times daily. 07/23/17   Larey Dresser, MD  insulin glargine (LANTUS) 100 unit/mL SOPN Inject 0.2 mLs (20 Units total) into the skin at bedtime. 01/10/17   Larey Dresser, MD  levothyroxine (SYNTHROID, LEVOTHROID) 25 MCG tablet Take 1 tablet (25 mcg total) by mouth daily before breakfast. 01/01/17   Clegg, Amy D, NP  multivitamin (RENA-VIT) TABS tablet Take 1 tablet by mouth at bedtime. 09/02/17   Larey Dresser, MD  pantoprazole (PROTONIX) 40 MG tablet Take 1 tablet (40 mg total) by mouth daily. 08/11/17   Clegg, Amy D, NP  sildenafil (REVATIO) 20 MG tablet Take 2 tablets (40 mg total) by mouth 3 (three) times daily. 03/29/17   Rogelia Mire, NP  traMADol (ULTRAM) 50 MG tablet TAKE 1 TABLET BY MOUTH EVERY 6 HOURS AS NEEDED FOR MODERATE PAIN 08/14/17   Bensimhon, Shaune Pascal, MD  traZODone (DESYREL) 50 MG tablet Take 1 tablet (50 mg total) by mouth at bedtime as needed for sleep. 09/02/17   Larey Dresser, MD  warfarin (COUMADIN) 5 MG tablet Take 7.5 mg (1 and 1/2 tablets) daily except 10 mg (2 tablets) on Tues/Thurs.    [provider]    Past Medical History: Past Medical History:  Diagnosis Date  . AICD (automatic cardioverter/defibrillator) present   . Asthma   . AVM (arteriovenous malformation) of colon 07/07/2017  . CHF (congestive heart failure) ([place])   . Diabetes ([place])   . ESRF (end stage renal failure) ([place]) 07/07/2017  . Kidney disease   . Permanent atrial fibrillation ([place]) 07/07/2017  . Presence of permanent cardiac pacemaker    AICD  . Sleep apnea     Past Surgical History: Past Surgical History:  Procedure Laterality Date  . IR FLUORO GUIDE CV LINE LEFT  06/17/2017  . IR US GUIDE VASC ACCESS LEFT  06/17/2017    Family History: Family History  Problem Relation Age of Onset  . Heart failure Father   . Heart attack Father   . Healthy Mother   . Diabetes Paternal Grandfather     Social History: Social History   Socioeconomic History  . Marital status: Widowed    Spouse name: Not on file  . Number of children: 0  . Years of education: 33  . Highest education level: Not on file  Social Needs  . Financial resource strain: Not on file  . Food insecurity - worry: Not on file  . Food insecurity - inability: Not on file  . Transportation needs - medical: Not on file  . Transportation needs - non-medical: Not on file  Occupational History  . Not on file  Tobacco Use  . Smoking status: Never Smoker  . Smokeless tobacco: Never Used  Substance and Sexual Activity  . Alcohol use: No  . Drug use: No  . Sexual activity: Not Currently  Other Topics Concern  . Not on file  Social  History Narrative   Patient is a widow. Moved to [place] from [place], [place]. Currently lives with his sister Trevor [name].    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     Allergies:  No Known Allergies  Objective:    Vital Signs:  BP (!) 114/101 (BP Location: Right Arm)   Pulse 73   Temp 98.1 F (  36.7 C) (Oral)   Resp (!) 23   Ht _0  (1.651 m)   Wt 187 lb 6.3 oz (85 kg)   SpO2 99%   BMI 31.18 kg/m   Mean arterial Pressure 71  Physical Exam    General:  Appears fatigued. Mild dyspnea at rest.  HEENT: Normal Neck: supple. JVP to jaw. Carotids 2+ bilat; no bruits. No lymphadenopathy or thyromegaly appreciated. Cor: Mechanical heart sounds with LVAD hum present. Lungs: Clear Abdomen: soft, nontender, ++distended. No hepatosplenomegaly. No bruits or masses. Good bowel sounds. Driveline: C/D/I; securement device intact and driveline incorporated Extremities: no cyanosis, clubbing, rash, R and LLE 2-3+edema.  Neuro: alert & orientedx3, cranial nerves grossly intact. moves all 4 extremities w/o difficulty. Affect pleasant   Telemetry     EKG   N/A   Labs    Basic Metabolic Panel: Recent Labs  Lab 09/08/17 1331  NA 137  K 3.4*  CL 97*  CO2 32  GLUCOSE 73  BUN 8  CREATININE 1.93*  CALCIUM 8.3*    Liver Function Tests: Recent Labs  Lab 09/08/17 1331  AST 41  ALT 31  ALKPHOS 119  BILITOT 1.0  PROT 7.4  ALBUMIN 3.9   No results for input(s): LIPASE, AMYLASE in the last 168 hours. No results for input(s): AMMONIA in the last 168 hours.  CBC: Recent Labs  Lab 09/08/17 1331  WBC 10.9*  HGB 10.7*  HCT 33.4*  MCV 94.1  PLT 113*    Cardiac Enzymes: No results for input(s): CKTOTAL, CKMB, CKMBINDEX, TROPONINI in the last 168 hours.  BNP: BNP (last 3 results) Recent Labs    11/28/16 1104 12/14/16 0348  BNP 1,825.3* 427.7*    ProBNP (last 3 results) Recent Labs    11/27/16 1207  PROBNP 1,746.0*     CBG: No results  for input(s): GLUCAP in the last 168 hours.  Coagulation Studies: Recent Labs    09/08/17 1331  LABPROT 30.5*  INR 2.95    Other results: EKG: n/a   Imaging    Dg Chest 2 View  Result Date: 09/08/2017 CLINICAL DATA:  Left ventricular assistance device. EXAM: CHEST  2 VIEW COMPARISON:  Radiographs of July 31, 2017. FINDINGS: Stable cardiomediastinal silhouette. Aortic valve prosthesis is noted. Left ventricular assistance device is again noted and unchanged. Stable single lead right-sided pacemaker is noted. Stable position of left sided catheter is noted with tip in right atrium. No pneumothorax or pleural effusion is noted. No acute pulmonary disease is noted. IMPRESSION: Stable position of left ventricular assistance device. No acute abnormality is noted. Electronically Signed   By: Marijo Conception, M.D.   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 Started [facility] 06/2017.   Admitted with marked volume overload. Nephrology consulted.      Assessment/Plan:    1. Acute/Chronic systolic CHF: Ischemic cardiomyopathy, EF 20-25% with RV dysfunction on 2/18 echo pre-LVAD.  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  Medtronic ICD.  He has had problems with RV failure post-op.  He developed worsening renal dysfunction and finally had to start HD in 8/18.  He had been tolerating HD so far though he is very fatigued on HD days.  However, over the last couple  of weeks weight has trended up significantly (6 kg) and he is more short of breath, NYHA IIIb.  Volume status elevated. Had iHD today. Sent over from HD center today. Needs additional volume removed. Goal is to dialyze daily in hospital and get him down to a dry weight, hopefully allowing maintenance of that weight after discharge. - Continue  warfarin with INR goal 2-2.5, no ASA.   - LDH 292, at baseline.  - Continue Revatio for RV failure.  2. CAD: s/p CABG.  No chest pain. Continue statin.   3. ESRD: Had iHD today but still SOB with minimal exertion.  - Consult renal.  4. Atrial fibrillation: Chronic.  On warfarin.  5. RV failure: Continue Revatio at 40 mg tid.   6. HTN: Stable.       7. Anemia: Baseline anemia of renal disease/chronic disease but recent bleeding from colonic AVMs. Check CBC today. Hgb 10.7. Stable today. Check CBC daily.  8. 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  Hgb stable.  - He is getting octreotide injections.  9. Skin cancer: On neck.  Needs excision, does not have to stop warfarin.  10. DMII- on insulin. Will start sliding scale.   Follow CBC, BMET, INR, LDH daily.   Admit to 2 C.  I reviewed the LVAD parameters from today, and compared the results to the patient's prior recorded data.  No programming changes were made.  The LVAD is functioning within specified parameters.  The patient performs LVAD self-test daily.  LVAD interrogation was negative for any significant power changes, alarms or PI events/speed drops.  LVAD equipment check completed and is in good working order.  Back-up equipment present.   LVAD education done on emergency procedures and precautions and reviewed exit site care.  Length of Stay: 0  Darrick Grinder, NP 09/08/2017, 3:18 PM  VAD Team Pager [phone] (7am - 7am) +++VAD ISSUES ONLY+++   Advanced Heart Failure Team Pager [phone] (M-F; [place])  Please contact [facility] for night-coverage after hours (4p -7a ) and weekends on [email or web address] for all non- LVAD Issues  Patient seen with NP, agree with the above note.  He was sent over from dialysis today, weight was up 6 kg and he has been short of breath with minimal exertion.   - LVAD parameters stable.  - Volume overloaded on exam today.  - Hemoglobin stable, no evidence for GI bleeding.  -  LDH stable, INR mildly elevated (goal 2-2.5, no ASA).   I think that he will need to be admitted to get daily inpatient HD.  We need to get him down to dry weight and hopefully can maintain him there afterwards.  He will need further education on fluid restriction.   Loralie Champagne 09/08/2017 5:05 PM

## 2017-09-08 NOTE — Plan of Care (Signed)
Will continue to monitor, plan to decrease volume overload

## 2017-09-09 ENCOUNTER — Encounter (HOSPITAL_COMMUNITY): Payer: Medicare Other

## 2017-09-09 ENCOUNTER — Inpatient Hospital Stay (HOSPITAL_COMMUNITY): Admission: RE | Admit: 2017-09-09 | Payer: Medicare Other | Source: Ambulatory Visit

## 2017-09-09 LAB — BASIC METABOLIC PANEL
ANION GAP: 7 (ref 5–15)
BUN: 15 mg/dL (ref 6–20)
CHLORIDE: 96 mmol/L — AB (ref 101–111)
CO2: 31 mmol/L (ref 22–32)
Calcium: 8.5 mg/dL — ABNORMAL LOW (ref 8.9–10.3)
Creatinine, Ser: 2.78 mg/dL — ABNORMAL HIGH (ref 0.61–1.24)
GFR calc non Af Amer: 21 mL/min — ABNORMAL LOW (ref >60)
GFR, EST AFRICAN AMERICAN: 25 mL/min — AB (ref >60)
Glucose, Bld: 121 mg/dL — ABNORMAL HIGH (ref 65–99)
Potassium: 3.5 mmol/L (ref 3.5–5.1)
Sodium: 134 mmol/L — ABNORMAL LOW (ref 135–145)

## 2017-09-09 LAB — GLUCOSE, CAPILLARY
Glucose-Capillary: 60 mg/dL — ABNORMAL LOW (ref 65–99)
Glucose-Capillary: 99 mg/dL (ref 65–99)
Glucose-Capillary: 143 mg/dL — ABNORMAL HIGH (ref 65–99)

## 2017-09-09 LAB — CBC
HCT: 31.8 % — ABNORMAL LOW (ref 39.0–52.0)
HEMOGLOBIN: 10.2 g/dL — AB (ref 13.0–17.0)
MCH: 30.7 pg (ref 26.0–34.0)
MCHC: 32.1 g/dL (ref 30.0–36.0)
MCV: 95.8 fL (ref 78.0–100.0)
Platelets: 114 10*3/uL — ABNORMAL LOW (ref 150–400)
RBC: 3.32 MIL/uL — ABNORMAL LOW (ref 4.22–5.81)
RDW: 17.9 % — ABNORMAL HIGH (ref 11.5–15.5)
WBC: 11.2 10*3/uL — AB (ref 4.0–10.5)

## 2017-09-09 LAB — PROTIME-INR
INR: 2.51
Prothrombin Time: 26.9 seconds — ABNORMAL HIGH (ref 11.4–15.2)

## 2017-09-09 LAB — MRSA PCR SCREENING: MRSA by PCR: NEGATIVE

## 2017-09-09 LAB — LACTATE DEHYDROGENASE: LDH: 266 U/L — ABNORMAL HIGH (ref 98–192)

## 2017-09-09 MED ORDER — WARFARIN SODIUM 7.5 MG PO TABS
7.5000 mg | ORAL_TABLET | Freq: Once | ORAL | Status: DC
  Filled 2017-09-09: qty 1

## 2017-09-09 MED ORDER — DOXYCYCLINE HYCLATE 100 MG PO CAPS: 100.0000 mg | ORAL_CAPSULE | Freq: Two times a day (BID) | ORAL | 0 refills | Status: DC

## 2017-09-09 MED ORDER — WARFARIN SODIUM 7.5 MG PO TABS: 7.5000 mg | ORAL_TABLET | ORAL | Status: DC

## 2017-09-09 MED ORDER — WARFARIN SODIUM 10 MG PO TABS: 10.0000 mg | ORAL_TABLET | ORAL | Status: DC

## 2017-09-09 NOTE — Progress Notes (Signed)
ANTICOAGULATION CONSULT NOTE - Follow Up Consult  Pharmacy Consult for warfarin Indication: LVAD  No Known Allergies  Patient Measurements: Ht: 5'5" Wt: 84.6 kg  Vital Signs: Temp: 97.9 F (36.6 C) (11/06 1130) Temp Source: Oral (11/06 1130) BP: 122/95 (11/06 1130) Pulse Rate: 72 (11/06 1130)  Labs: Recent Labs    09/08/17 1331 09/09/17 0211  HGB 10.7* 10.2*  HCT 33.4* 31.8*  PLT 113* 114*  LABPROT 30.5* 26.9*  INR 2.95 2.51  CREATININE 1.93* 2.78*    Estimated Creatinine Clearance: 24 mL/min (A) (by C-G formula based on SCr of 2.78 mg/dL (H)).   Medical History: Past Medical History:  Diagnosis Date  . AICD (automatic cardioverter/defibrillator) present   . Asthma   . AVM (arteriovenous malformation) of colon 07/07/2017  . CHF (congestive heart failure) ([place])   . Diabetes ([place])   . ESRF (end stage renal failure) ([place]) 07/07/2017  . Kidney disease   . Permanent atrial fibrillation ([place]) 07/07/2017  . Presence of permanent cardiac pacemaker    AICD  . Sleep apnea     Medications:  Scheduled:  . atorvastatin  40 mg Oral q1800  . budesonide  0.25 mg Nebulization BID  . busPIRone  5 mg Oral BID  . [START ON 09/10/2017] calcitRIOL  0.5 mcg Oral QODAY  . docusate sodium  100 mg Oral BID  . ferric citrate  420 mg Oral TID WC  . gabapentin  300 mg Oral BID  . insulin aspart  0-15 Units Subcutaneous TID WC  . insulin glargine  20 Units Subcutaneous QHS  . levothyroxine  25 mcg Oral QAC breakfast  . multivitamin  1 tablet Oral QHS  . pantoprazole  40 mg Oral Daily  . sildenafil  40 mg Oral TID  . [START ON 09/11/2017] warfarin  10 mg Oral Once per day on Tue Thu  . warfarin  7.5 mg Oral ONCE-1800  . [START ON 09/10/2017] warfarin  7.5 mg Oral Once per day on Sun Mon Wed Fri Sat  . Warfarin - Pharmacist Dosing Inpatient   Does not apply q1800    Assessment: 3 yom with history of LVAD and atrial fibrillation admitted for CHF with elevated volume status. Last  appointment was on 10/30 with home regimen of 7.5 mg daily except 10 mg on Tuesday and Thursday.   INR on admit  is supratherapeutic at 2.95> fell 2.5 with lower dose last pm.  Goal range is 2-2.5. Hgb is 10.7, with platelets at 113. Last dose of warfarin was last night (11/4). No signs/symptoms of bleeding noted in the chart. LDH is 292. No new medication interactions.   Goal of Therapy:  INR 2-2.5 Monitor platelets by anticoagulation protocol: Yes   Plan:   warfarin 7.5 mg tonight Restart home dose tomorrow 7.5mg  al days except 10mg  TT Monitor daily INR and CBC Monitor for signs/symptoms of bleeding   [name] Pharm.D. CPP, BCPS Clinical Pharmacist [phone] 09/09/2017 12:00 PM

## 2017-09-09 NOTE — Consult Note (Signed)
Reason for Consult:   Volume overload in patient with ESRD/LVAD   Referring Physician: Mariann Laster M.D. (cardiology)  HPI:  71 year old Caucasian man with past medical history significant for coronary artery disease status post four-vessel CABG, obstructive sleep apnea, aortic stenosis status post TAVT 2015, congestive heart failure status post LVAD and end-stage renal disease on hemodialysis Monday/Wednesday/Friday. Went to his scheduled hemodialysis treatment yesterday and was found to be 8.5 kg over his dry weight and thereafter underwent hemodialysis with ultrafiltration of 3.5 L as per maximum permitted guidelines for ultrafiltration at the dialysis unit. Because he was still short of breath, he was directed over to the congestive heart failure clinic/hospital for further management. He denies any cough, fever, chills or hemoptysis. He denies any nausea, vomiting or diarrhea. He denies any unusual dizziness and has mild swelling over his legs. He admits that he has terrible difficulty with controlling his fluid gains.  Hemodialysis prescription: Monday/Wednesday/Friday-[facility] 4-1/2 hours, 180 dialyzer, blood flow rate 400/dialysate flow 800, estimated dry weight 81.5 kg, 2K/2 calcium, no UF profile, no sodium modeling via left IJ TDC. Heparin 2500 unit bolus, Mircera 225 g IV every 2 weeks, calcitriol 0.25 g 3 times a week and Venofer 50 mg IV every week.  Past Medical History:  Diagnosis Date  . AICD (automatic cardioverter/defibrillator) present   . Asthma   . AVM (arteriovenous malformation) of colon 07/07/2017  . CHF (congestive heart failure) ([place])   . Diabetes ([place])   . ESRF (end stage renal failure) ([place]) 07/07/2017  . Kidney disease   . Permanent atrial fibrillation ([place]) 07/07/2017  . Presence of permanent cardiac pacemaker    AICD  . Sleep apnea     Past Surgical History:  Procedure Laterality Date  . IR FLUORO GUIDE CV LINE LEFT  06/17/2017  . IR US GUIDE  VASC ACCESS LEFT  06/17/2017    Family History  Problem Relation Age of Onset  . Heart failure Father   . Heart attack Father   . Healthy Mother   . Diabetes Paternal Grandfather     Social History:  reports that  has never smoked. he has never used smokeless tobacco. He reports that he does not drink alcohol or use drugs.  Allergies: No Known Allergies  Medications:  Scheduled: . atorvastatin  40 mg Oral q1800  . budesonide  0.25 mg Nebulization BID  . busPIRone  5 mg Oral BID  . [START ON 09/10/2017] calcitRIOL  0.5 mcg Oral QODAY  . docusate sodium  100 mg Oral BID  . ferric citrate  420 mg Oral TID WC  . gabapentin  300 mg Oral BID  . insulin aspart  0-15 Units Subcutaneous TID WC  . insulin glargine  20 Units Subcutaneous QHS  . levothyroxine  25 mcg Oral QAC breakfast  . multivitamin  1 tablet Oral QHS  . [START ON 09/11/2017] octreotide  20 mg Intramuscular Q28 days  . pantoprazole  40 mg Oral Daily  . sildenafil  40 mg Oral TID  . Warfarin - Pharmacist Dosing Inpatient   Does not apply q1800    BMP Latest Ref Rng & Units 09/09/2017 09/08/2017 08/05/2017  Glucose 65 - 99 mg/dL 121(H) 73 218(H)  BUN 6 - 20 mg/dL 15 8 24(H)  Creatinine 0.61 - 1.24 mg/dL 2.78(H) 1.93(H) 4.27(H)  Sodium 135 - 145 mmol/L 134(L) 137 136  Potassium 3.5 - 5.1 mmol/L 3.5 3.4(L) 3.8  Chloride 101 - 111 mmol/L 96(L) 97(L) 95(L)  CO2 22 - 32 mmol/L 31 32 32  Calcium 8.9 - 10.3 mg/dL 8.5(L) 8.3(L) 9.0   CBC Latest Ref Rng & Units 09/09/2017 09/08/2017 08/05/2017  WBC 4.0 - 10.5 K/uL 11.2(H) 10.9(H) 9.8  Hemoglobin 13.0 - 17.0 g/dL 10.2(L) 10.7(L) 9.9(L)  Hematocrit 39.0 - 52.0 % 31.8(L) 33.4(L) 31.3(L)  Platelets 150 - 400 K/uL 114(L) 113(L) 146(L)     Dg Chest 2 View  Result Date: 09/08/2017 CLINICAL DATA:  Left ventricular assistance device. EXAM: CHEST  2 VIEW COMPARISON:  Radiographs of July 31, 2017. FINDINGS: Stable cardiomediastinal silhouette. Aortic valve prosthesis is noted. Left  ventricular assistance device is again noted and unchanged. Stable single lead right-sided pacemaker is noted. Stable position of left sided catheter is noted with tip in right atrium. No pneumothorax or pleural effusion is noted. No acute pulmonary disease is noted. IMPRESSION: Stable position of left ventricular assistance device. No acute abnormality is noted. Electronically Signed   By: Marijo Conception, M.D.   On: 09/08/2017 14:48    Review of Systems  Constitutional: Negative for chills, fever and malaise/fatigue.  HENT: Negative.   Respiratory: Positive for shortness of breath.   Cardiovascular: Positive for orthopnea and leg swelling. Negative for chest pain.       Reports associated dyspnea on exertion  Gastrointestinal: Negative.   Genitourinary: Negative.   Musculoskeletal: Negative.   Skin: Negative.   Neurological: Negative.    Blood pressure 111/83, pulse 80, temperature (!) 97.5 F (36.4 C), temperature source Oral, resp. rate 16, height 5\' 5"  (1.651 m), weight 85.1 kg (187 lb 9.8 oz), SpO2 98 %. Physical Exam  Nursing note and vitals reviewed. Constitutional: He is oriented to person, place, and time. He appears well-developed and well-nourished. No distress.  HENT:  Head: Normocephalic and atraumatic.  Mouth/Throat: Oropharynx is clear and moist. No oropharyngeal exudate.  Eyes: Pupils are equal, round, and reactive to light. No scleral icterus.  Neck: Normal range of motion. Neck supple. JVD present.  JVP elevated to mandible  Cardiovascular: Normal rate and normal heart sounds.  No murmur heard. Irregularly irregular  Respiratory: Effort normal. No respiratory distress. He has rales.  GI: Soft. Bowel sounds are normal. He exhibits no distension. There is no tenderness. There is no guarding.  Musculoskeletal: He exhibits edema.  2-3+ pitting lower extremity edema  Neurological: He is alert and oriented to person, place, and time.  Skin: Skin is warm and dry. No rash  noted.    Assessment/Plan: 1. Volume overload in patient with end-stage renal disease/LVAD: Underwent hemodialysis yesterday with ultrafiltration limited by goal units that kept him from getting down to his dry weight and directed to the hospital for inpatient management. Undergoing hemodialysis today to try and chip away at his large goal (reasonably exercising caution to avoid intradialytic hypotension). If clinically better after hemodialysis today, may potentially be discharged to resume dialysis tomorrow at his outpatient unit. 2. End-stage renal disease: Usually on hemodialysis on a Monday/Wednesday/Friday schedule-getting supplemental dialysis at this time to try and get him back to his dry weight. We discussed the need for limiting his intradialytic weight gain with which he has had a long-standing problem. 3. Hyponatremia: Secondary to unrestricted fluid intake/dilutional-discussed recommendations for decreasing fluid intake. 4. Anemia: Denies overt blood loss, resume ESA as an outpatient. 5. Metabolic bone disease: Continue calcitriol with dialysis for PTH suppression and continue phosphorus binders. Reiterated renal diet.  Stony Stegmann K. 09/09/2017, 8:53 AM

## 2017-09-09 NOTE — Progress Notes (Signed)
LVAD Coordinator Rounding Note:  Admitted 09/08/17 due to increased SOB and increase in weight requiring more dialysis than OP center can provide.   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 Darcey Nora under Destination Therapy criteria.   Left brachiocephalic AV fistula placed 06/30/17. Right IJ tunneled dialysis catheter placed 06/30/17.  Vital signs: HR: 74AFib Doppler Pressure: 96 Automatic BP: 94/73 (80) O2 Sat: 98% on RA Wt in lbs: 179lbs  LVAD interrogation reveals:  Speed: 9600 Flow: 6.0 Power: 6.4 w PI: 5.0 Alarms: none Events: none Fixed speed: 9600 Low speed limit: 9000  Drive Line: Existing VAD dressing removed and site care performed using sterile technique. Drive line exit site cleaned with Chlora prep applicator and then rinsed with saline, allowed to dry, and Sorbaview dressing with bio patch re-applied. Exit site healed and incorporated, the velour is fully implanted at exit site. No redness, tenderness, drainage, foul odor or rash noted. Drive line anchor re-applied.  Labs:  LDH trend: 266  INR trend: 2.51  Creat trend: 2.78   Renal: -HD started 06/23/17, planning for m,w,f -HD performed yesterday and today. Pt will increase HD to M,T,W and F.    Anticoagulation Plan: -INR Goal: 2.0 - 2.5 - warfarin restarted 8/27 -ASA Dose: 81 mg daily - on hold  Device: -Medtronic single lead -Therapies: on   Plan/Recommendations:   1. Pt will be discharged today. Will increase HD to 4 days a week. 2. Pt will f/u in VAD clinic next Tuesday at 0900. 2. Please page VAD coordinator for equipment issues or patient concerns   [name], [place] Coordinator 24/7 pager [phone]

## 2017-09-09 NOTE — Discharge Summary (Signed)
Advanced Heart Failure Team  Discharge Summary   Patient ID: Trevor [name] MRN: [id number], DOB/AGE: 72/02/1946 72 y.o. Admit date: 09/08/2017 D/C date:     09/09/2017   Primary Discharge Diagnoses:  1.Acute/Chronic systolic CHF: Ischemic cardiomyopathy, EF 20-25% with RV dysfunction on 2/18 echo pre-LVAD. 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 Medtronic ICD.  2. CAD: s/p CABG. 3. ESRD 4. Atrial fibrillation: Chronic. 5. RV failure 6. HTN 7. Anemia 8. GI bleeding 9. Skin cancer 10. DMII 11. ?URI  Hospital Course:   Mr Mallis is a 71 year old with history of CAD S/P CABG x4 2010, OSA, AS TAVT 2015, CKD, DMII, PAF, asthma, ICM, medtronic ICD, ESRD on iHD Mon-Wed-Fri (AVF LUE, RV failure,  GI bleed, and HMII LVAD 12/2016. Started [facility] 06/2017. Also has skin cancer on his neck.   Sent to HF clinic 09/08/17 from HD with volume overload and NYHA IIIB-IV symptoms. Pt admitted for extra dialysis as HD center reported they were unable to take off > 3.0 L at any given treatment.   Dr. Posey Pronto consulted and pt received dialysis am of 09/09/17 (Usually a day off).  Pt thought stable for discharge from renal perspective, and discussions begun to change pts HD schedule to 4 days a week.   Hospital course complicated by URI symptoms that have been persistent for > 1 week.  Pt prescribed course of doxycycline.   Pt counseled extensively on fluid and ice chip restrictions.   Pt examined 09/09/17 and thought stable for discharge. His HD schedule will be adjusted to 4x/week for the next several weeks at least until weight is stable. He will follow up closely in HF clinic as below. He will be discharged to home in stable condition.   LVAD Interrogation HM II:  Flow: 6.1 L/min Speed 9600, power 7.0, PI 4.9.       Discharge Weight: 179 lbs Discharge Vitals: Blood pressure 94/73, pulse 74, temperature 98.4 F (36.9 C), temperature source Oral, resp. rate 15, height _0  (1.651 m), weight 179 lb 14.3 oz (81.6 kg),  SpO2 98 %.  Labs: Lab Results  Component Value Date   WBC 11.2 (H) 09/09/2017   HGB 10.2 (L) 09/09/2017   HCT 31.8 (L) 09/09/2017   MCV 95.8 09/09/2017   PLT 114 (L) 09/09/2017    Recent Labs  Lab 09/08/17 1331 09/09/17 0211  NA 137 134*  K 3.4* 3.5  CL 97* 96*  CO2 32 31  BUN 8 15  CREATININE 1.93* 2.78*  CALCIUM 8.3* 8.5*  PROT 7.4  --   BILITOT 1.0  --   ALKPHOS 119  --   ALT 31  --   AST 41  --   GLUCOSE 73 121*   Lab Results  Component Value Date   CHOL 59 12/04/2016   HDL 30 (L) 12/04/2016   LDLCALC 15 12/04/2016   TRIG 68 12/04/2016   BNP (last 3 results) Recent Labs    11/28/16 1104 12/14/16 0348  BNP 1,825.3* 427.7*    ProBNP (last 3 results) Recent Labs    11/27/16 1207  PROBNP 1,746.0*     Diagnostic Studies/Procedures   Dg Chest 2 View  Result Date: 09/08/2017 CLINICAL DATA:  Left ventricular assistance device. EXAM: CHEST  2 VIEW COMPARISON:  Radiographs of July 31, 2017. FINDINGS: Stable cardiomediastinal silhouette. Aortic valve prosthesis is noted. Left ventricular assistance device is again noted and unchanged. Stable single lead right-sided pacemaker is noted. Stable position of left sided catheter is  noted with tip in right atrium. No pneumothorax or pleural effusion is noted. No acute pulmonary disease is noted. IMPRESSION: Stable position of left ventricular assistance device. No acute abnormality is noted. Electronically Signed   By: Marijo Conception, M.D.   On: 09/08/2017 14:48    Discharge Medications   Allergies as of 09/09/2017   No Known Allergies     Medication List    TAKE these medications   albuterol 108 (90 Base) MCG/ACT inhaler Commonly known as:  PROVENTIL HFA;VENTOLIN HFA Inhale 2 puffs into the lungs every 4 (four) hours as needed for wheezing or shortness of breath.   albuterol (2.5 MG/3ML) 0.083% nebulizer solution Commonly known as:  PROVENTIL Take 3 mLs (2.5 mg total) by nebulization every 4 (four)  hours. And as needed   atorvastatin 40 MG tablet Commonly known as:  LIPITOR TAKE 1 TABLET(40 MG) BY MOUTH DAILY   busPIRone 5 MG tablet Commonly known as:  BUSPAR Take 1 tablet (5 mg total) by mouth 2 (two) times daily.   calcitRIOL 0.5 MCG capsule Commonly known as:  ROCALTROL Take 1 capsule (0.5 mcg total) by mouth every other day.   docusate sodium 100 MG capsule Commonly known as:  COLACE Take 1 capsule (100 mg total) by mouth 2 (two) times daily. What changed:  when to take this   doxycycline 100 MG capsule Commonly known as:  VIBRAMYCIN Take 1 capsule (100 mg total) 2 (two) times daily by mouth.   ferric citrate 1 GM 210 MG(Fe) tablet Commonly known as:  AURYXIA Take 420 mg by mouth 3 (three) times daily with meals.   fluticasone 110 MCG/ACT inhaler Commonly known as:  FLOVENT HFA Inhale 2 puffs into the lungs 2 (two) times daily.   gabapentin 600 MG tablet Commonly known as:  NEURONTIN Take 0.5 tablets (300 mg total) by mouth 2 (two) times daily.   guaiFENesin 600 MG 12 hr tablet Commonly known as:  MUCINEX Take 1,200 mg 2 (two) times daily as needed by mouth for cough.   insulin glargine 100 unit/mL Sopn Commonly known as:  LANTUS Inject 0.2 mLs (20 Units total) into the skin at bedtime.   levothyroxine 25 MCG tablet Commonly known as:  SYNTHROID, LEVOTHROID Take 1 tablet (25 mcg total) by mouth daily before breakfast.   multivitamin Tabs tablet Take 1 tablet by mouth at bedtime.   pantoprazole 40 MG tablet Commonly known as:  PROTONIX Take 1 tablet (40 mg total) by mouth daily.   sildenafil 20 MG tablet Commonly known as:  REVATIO Take 2 tablets (40 mg total) by mouth 3 (three) times daily.   traMADol 50 MG tablet Commonly known as:  ULTRAM TAKE 1 TABLET BY MOUTH EVERY 6 HOURS AS NEEDED FOR MODERATE PAIN   traZODone 50 MG tablet Commonly known as:  DESYREL Take 1 tablet (50 mg total) by mouth at bedtime as needed for sleep.   warfarin 5 MG  tablet Commonly known as:  COUMADIN Take 7.5 mg (1 and 1/2 tablets) daily except 10 mg (2 tablets) on Tues/Thurs.       Disposition   The patient will be discharged in stable condition to home.  Discharge Instructions    (HEART FAILURE PATIENTS) Call MD:  Anytime you have any of the following symptoms: 1) 3 pound weight gain in 24 hours or 5 pounds in 1 week 2) shortness of breath, with or without a dry hacking cough 3) swelling in the hands, feet or stomach 4)  if you have to sleep on extra pillows at night in order to breathe.   Complete by:  As directed    Diet - low sodium heart healthy   Complete by:  As directed    Increase activity slowly   Complete by:  As directed    STOP any activity that causes chest pain, shortness of breath, dizziness, sweating, or exessive weakness   Complete by:  As directed      Follow-up Information    Larey Dresser, MD Follow up.   Specialty:  Cardiology Contact information: [place] [address] [phone]             Duration of Discharge Encounter: Greater than 35 minutes   Signed, Annamaria Helling  09/09/2017, 1:57 PM

## 2017-09-09 NOTE — Plan of Care (Signed)
Continue current care plan 

## 2017-09-09 NOTE — Procedures (Signed)
Patient seen on Hemodialysis. QB 350, UF goal 4L Treatment adjusted as needed.  Elmarie Shiley MD [facility]. Office # [phone] Pager # [phone] 8:49 AM

## 2017-09-09 NOTE — Progress Notes (Signed)
Hypoglycemic Event  CBG: 60  Treatment: 15 GM carbohydrate snack  Symptoms: Shaky  Follow-up CBG: Time: 0608 CBG Result: 99  Possible Reasons for Event: Inadequate meal intake  Comments/MD notified: Patient feels better    Trevor [name]

## 2017-09-09 NOTE — Progress Notes (Signed)
Advanced Heart Failure VAD Team Note  Subjective:    Goal Dry weight is 178 lbs. He was 187 lbs this am.   Just finishing dialysis on my arrival.  Weight after is 81.6 kg (180 lbs). Pt states he is not feeling that great. Continues with chest congestion and yellow productive cough.   CXR did not show PNA or CHF.   LVAD INTERROGATION:  HeartMate II LVAD:  Flow 6.1 liters/min, speed 9600, power 7.0, PI 4.9.  Objective:    Vital Signs:   Temp:  [97.2 F (36.2 C)-98.4 F (36.9 C)] 97.5 F (36.4 C) (11/06 0720) Pulse Rate:  [62-87] 87 (11/06 1000) Resp:  [14-23] 18 (11/06 1000) BP: (81-124)/(57-101) 124/57 (11/06 1000) SpO2:  [96 %-100 %] 100 % (11/06 1000) Weight:  [187 lb 6.3 oz (85 kg)-187 lb 9.8 oz (85.1 kg)] 187 lb 9.8 oz (85.1 kg) (11/06 0720) Last BM Date: 09/07/17 Mean arterial Pressure 82  Intake/Output:   Intake/Output Summary (Last 24 hours) at 09/09/2017 1117 Last data filed at 09/09/2017 0709 Gross per 24 hour  Intake 840 ml  Output 400 ml  Net 440 ml     Physical Exam    General:  No resp difficulty HEENT: normal Neck: supple. JVP 9-10 cm. Carotids 2+ bilat; no bruits. No lymphadenopathy or thyromegaly appreciated. Cor: Mechanical heart sounds with LVAD hum present. Lungs: clear Abdomen: soft, nontender, nondistended. No hepatosplenomegaly. No bruits or masses. Good bowel sounds. Driveline: C/D/I; securement device intact and driveline incorporated Extremities: no cyanosis, clubbing, or rash. BLE 1-2+ edema.  Neuro: alert & orientedx3, cranial nerves grossly intact. moves all 4 extremities w/o difficulty. Affect pleasant   Telemetry   NSR 90s, Personally reviewed.   EKG    N/A  Labs   Basic Metabolic Panel: Recent Labs  Lab 09/08/17 1331 09/09/17 0211  NA 137 134*  K 3.4* 3.5  CL 97* 96*  CO2 32 31  GLUCOSE 73 121*  BUN 8 15  CREATININE 1.93* 2.78*  CALCIUM 8.3* 8.5*    Liver Function Tests: Recent Labs  Lab 09/08/17 1331  AST  41  ALT 31  ALKPHOS 119  BILITOT 1.0  PROT 7.4  ALBUMIN 3.9   No results for input(s): LIPASE, AMYLASE in the last 168 hours. No results for input(s): AMMONIA in the last 168 hours.  CBC: Recent Labs  Lab 09/08/17 1331 09/09/17 0211  WBC 10.9* 11.2*  HGB 10.7* 10.2*  HCT 33.4* 31.8*  MCV 94.1 95.8  PLT 113* 114*   INR: Recent Labs  Lab 09/08/17 1331 09/09/17 0211  INR 2.95 2.51   Other results:  EKG:   Imaging   Dg Chest 2 View  Result Date: 09/08/2017 CLINICAL DATA:  Left ventricular assistance device. EXAM: CHEST  2 VIEW COMPARISON:  Radiographs of July 31, 2017. FINDINGS: Stable cardiomediastinal silhouette. Aortic valve prosthesis is noted. Left ventricular assistance device is again noted and unchanged. Stable single lead right-sided pacemaker is noted. Stable position of left sided catheter is noted with tip in right atrium. No pneumothorax or pleural effusion is noted. No acute pulmonary disease is noted. IMPRESSION: Stable position of left ventricular assistance device. No acute abnormality is noted. Electronically Signed   By: Marijo Conception, M.D.   On: 09/08/2017 14:48     Medications:     Scheduled Medications: . atorvastatin  40 mg Oral q1800  . budesonide  0.25 mg Nebulization BID  . busPIRone  5 mg Oral BID  . [START  ON 09/10/2017] calcitRIOL  0.5 mcg Oral QODAY  . docusate sodium  100 mg Oral BID  . ferric citrate  420 mg Oral TID WC  . gabapentin  300 mg Oral BID  . insulin aspart  0-15 Units Subcutaneous TID WC  . insulin glargine  20 Units Subcutaneous QHS  . levothyroxine  25 mcg Oral QAC breakfast  . multivitamin  1 tablet Oral QHS  . pantoprazole  40 mg Oral Daily  . sildenafil  40 mg Oral TID  . Warfarin - Pharmacist Dosing Inpatient   Does not apply q1800     Infusions:   PRN Medications:  acetaminophen, albuterol, ondansetron (ZOFRAN) IV, traMADol, traZODone  Patient Profile   Mr Hochstatter is a 71 year old with history of  CAD S/P CABG x4 2010, OSA, AS TAVT 2015, CKD, DMII, PAF, asthma, ICM, medtronic ICD, ESRD on iHD Mon-Wed-Fri (AVF LUE, RV failure,  GI bleed, and HMII LVAD 12/2016. Started [facility] 06/2017.   Admitted with marked volume overload. Nephrology consulted.    Assessment/Plan:    1.Acute/Chronic systolic CHF: Ischemic cardiomyopathy, EF 20-25% with RV dysfunction on 2/18 echo pre-LVAD. 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 Medtronic ICD.  He has had problems with RV failure post-op. He developed worsening renal dysfunction and finally had to start HD in 8/18. He had been tolerating HD so far though he is very fatigued on HD days.  However, over the last couple of weeks weight has trended up significantly (6 kg) and he is more short of breath. - Volume status remains elevated but improved after dialysis, weight down 7 lbs.  - Continue daily HD to dry weight.  - Continue warfarin with INR goal 2-2.5, no ASA. Dosing per pharmacy.  - LDH 266. At baseline.  - Continue Revatio for RV failure.  2. CAD: s/p CABG. - No s/s of ischemia.    - Continue statin.  3. ESRD: - Appreciate renal input.  - Had long discussion about fluid restriction. May need to go to 4 times a week HD.  - Recommended sugar free candy for his dry mouth, as oppose to cups of ice chips.  4. Atrial fibrillation: Chronic. On coumadin for AC. INR 2.51. Dosing per pharm.  5. RV failure: - ContinueRevatio at 40 mg tid. HD.  6. HTN - Stable. Follow on HD. 7. Anemia: Baseline anemia of renal disease/chronic disease but recent bleeding from colonic AVMs. - Hgb relatively stable at 10.2. Continue to follow.  8. 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  Hgb stable.  - He is getting octreotide injections.  9. Skin cancer: On neck. - Needs excision, does not have to stop warfarin. Not an issue this admission.  10. DMII - Continue sliding scale.  11. ?URI - WBC 11.2. Has had productive cough for > 1 week.  - As  has not improved will consider course of doxycycline and discuss with MD.   Dr. Posey Pronto said pt could potentially go today. Will discuss with MD. May need to keep for his regularly scheduled HD tomorrow, and send home after.   I reviewed the LVAD parameters from today, and compared the results to the patient's prior recorded data.  No programming changes were made.  The LVAD is functioning within specified parameters.  The patient performs LVAD self-test daily.  LVAD interrogation was negative for any significant power changes, alarms or PI events/speed drops.  LVAD equipment check completed and is in good working order.  Back-up  equipment present.   LVAD education done on emergency procedures and precautions and reviewed exit site care.  Length of Stay: 1  Annamaria Helling 09/09/2017, 11:17 AM  VAD Team --- VAD ISSUES ONLY--- Pager [phone] (7am - 7am)  Advanced Heart Failure Team  Pager [phone] (M-F; 7a - 4p)  Please contact [facility] for night-coverage after hours (4p -7a ) and weekends on [email or web address]  Patient seen with PA, agree with the above note.    He had HD yesterday as outpatient and today as inpatient, significant weight off today.  I talked with Dr. Posey Pronto.  He says he thinks that Mr Colberg can go home today and have his regular outpatient HD tomorrow.  He recommends 4 x/week HD for the next few weeks until weight is stabilized.   - I will discharge him today.  Will make sure outpatient HD unit knows he will be coming tomorrow.  Dr. Posey Pronto to followup with them about HD recommendations.   Suspect URI with cough, yellow sputum.  CXR with no PNA or CHF.  No fever, WBCs minimally elevated.  He is getting Mucinex at home.  Given ongoing symptoms, I will give him a course of doxycycline 100 mg bid x 7 days.   Loralie Champagne 09/09/2017 12:33 PM

## 2017-09-09 NOTE — Progress Notes (Signed)
Explained and discussed discharge instructions, follow up appt. Prescriptions given to pt. Pt going home via w/c with belongings with sister.

## 2017-09-10 LAB — HEPATITIS B SURFACE ANTIGEN: HEP B S AG: NEGATIVE

## 2017-09-11 ENCOUNTER — Other Ambulatory Visit (HOSPITAL_COMMUNITY): Payer: Self-pay | Admitting: *Deleted

## 2017-09-11 ENCOUNTER — Telehealth: Payer: Self-pay | Admitting: *Deleted

## 2017-09-11 ENCOUNTER — Other Ambulatory Visit: Payer: Self-pay | Admitting: Nurse Practitioner

## 2017-09-11 MED ORDER — SILDENAFIL CITRATE 20 MG PO TABS: 40.0000 mg | ORAL_TABLET | Freq: Three times a day (TID) | ORAL | 6 refills | Status: DC

## 2017-09-11 NOTE — Telephone Encounter (Signed)
Pt was on TCM list admitted 09/08/17 for Acute/Chronic systolic CHF: Ischemic cardiomyopathy, EF 20-25% with RV dysfunction on 2/18 echo pre-LVAD. 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 Medtronic ICD. 2. CAD: s/p CABG.Pt D/C 11/6/*18, and will follow-up w/cardiology...Johny Chess

## 2017-09-15 ENCOUNTER — Other Ambulatory Visit (HOSPITAL_COMMUNITY): Payer: Self-pay | Admitting: *Deleted

## 2017-09-15 ENCOUNTER — Ambulatory Visit: Payer: Medicare Other | Admitting: Nurse Practitioner

## 2017-09-15 DIAGNOSIS — I5043 Acute on chronic combined systolic (congestive) and diastolic (congestive) heart failure: Secondary | ICD-10-CM

## 2017-09-15 DIAGNOSIS — Z95811 Presence of heart assist device: Secondary | ICD-10-CM

## 2017-09-15 DIAGNOSIS — Z7901 Long term (current) use of anticoagulants: Secondary | ICD-10-CM

## 2017-09-16 ENCOUNTER — Ambulatory Visit (HOSPITAL_COMMUNITY): Payer: Self-pay | Admitting: Pharmacist

## 2017-09-16 ENCOUNTER — Encounter (HOSPITAL_COMMUNITY): Payer: Self-pay

## 2017-09-16 ENCOUNTER — Ambulatory Visit (HOSPITAL_COMMUNITY)
Admission: RE | Admit: 2017-09-16 | Discharge: 2017-09-16 | Disposition: A | Discharge status: Home / Self Care | Payer: Medicare Other | Source: Ambulatory Visit | Attending: Cardiology | Admitting: Cardiology

## 2017-09-16 DIAGNOSIS — Z952 Presence of prosthetic heart valve: Secondary | ICD-10-CM | POA: Insufficient documentation

## 2017-09-16 DIAGNOSIS — I5022 Chronic systolic (congestive) heart failure: Secondary | ICD-10-CM | POA: Insufficient documentation

## 2017-09-16 DIAGNOSIS — Z95811 Presence of heart assist device: Secondary | ICD-10-CM | POA: Diagnosis present

## 2017-09-16 DIAGNOSIS — L409 Psoriasis, unspecified: Secondary | ICD-10-CM | POA: Diagnosis not present

## 2017-09-16 DIAGNOSIS — Z951 Presence of aortocoronary bypass graft: Secondary | ICD-10-CM | POA: Diagnosis not present

## 2017-09-16 DIAGNOSIS — Z794 Long term (current) use of insulin: Secondary | ICD-10-CM | POA: Insufficient documentation

## 2017-09-16 DIAGNOSIS — D6959 Other secondary thrombocytopenia: Secondary | ICD-10-CM | POA: Diagnosis not present

## 2017-09-16 DIAGNOSIS — E1122 Type 2 diabetes mellitus with diabetic chronic kidney disease: Secondary | ICD-10-CM | POA: Diagnosis not present

## 2017-09-16 DIAGNOSIS — I2510 Atherosclerotic heart disease of native coronary artery without angina pectoris: Secondary | ICD-10-CM | POA: Insufficient documentation

## 2017-09-16 DIAGNOSIS — C4490 Unspecified malignant neoplasm of skin, unspecified: Secondary | ICD-10-CM | POA: Insufficient documentation

## 2017-09-16 DIAGNOSIS — R570 Cardiogenic shock: Secondary | ICD-10-CM | POA: Diagnosis not present

## 2017-09-16 DIAGNOSIS — I482 Chronic atrial fibrillation: Secondary | ICD-10-CM | POA: Diagnosis not present

## 2017-09-16 DIAGNOSIS — I132 Hypertensive heart and chronic kidney disease with heart failure and with stage 5 chronic kidney disease, or end stage renal disease: Secondary | ICD-10-CM | POA: Insufficient documentation

## 2017-09-16 DIAGNOSIS — D638 Anemia in other chronic diseases classified elsewhere: Secondary | ICD-10-CM | POA: Diagnosis not present

## 2017-09-16 DIAGNOSIS — G4733 Obstructive sleep apnea (adult) (pediatric): Secondary | ICD-10-CM | POA: Diagnosis not present

## 2017-09-16 DIAGNOSIS — N179 Acute kidney failure, unspecified: Secondary | ICD-10-CM | POA: Diagnosis not present

## 2017-09-16 DIAGNOSIS — I5043 Acute on chronic combined systolic (congestive) and diastolic (congestive) heart failure: Secondary | ICD-10-CM | POA: Diagnosis not present

## 2017-09-16 DIAGNOSIS — I082 Rheumatic disorders of both aortic and tricuspid valves: Secondary | ICD-10-CM | POA: Insufficient documentation

## 2017-09-16 DIAGNOSIS — Z7901 Long term (current) use of anticoagulants: Secondary | ICD-10-CM | POA: Diagnosis not present

## 2017-09-16 DIAGNOSIS — Z79899 Other long term (current) drug therapy: Secondary | ICD-10-CM | POA: Diagnosis not present

## 2017-09-16 DIAGNOSIS — N186 End stage renal disease: Secondary | ICD-10-CM | POA: Insufficient documentation

## 2017-09-16 DIAGNOSIS — K5521 Angiodysplasia of colon with hemorrhage: Secondary | ICD-10-CM | POA: Diagnosis not present

## 2017-09-16 LAB — BASIC METABOLIC PANEL
Anion gap: 11 (ref 5–15)
BUN: 19 mg/dL (ref 6–20)
CALCIUM: 8.9 mg/dL (ref 8.9–10.3)
CHLORIDE: 90 mmol/L — AB (ref 101–111)
CO2: 28 mmol/L (ref 22–32)
CREATININE: 3.66 mg/dL — AB (ref 0.61–1.24)
GFR, EST AFRICAN AMERICAN: 18 mL/min — AB (ref >60)
GFR, EST NON AFRICAN AMERICAN: 15 mL/min — AB (ref >60)
Glucose, Bld: 435 mg/dL — ABNORMAL HIGH (ref 65–99)
Potassium: 3.6 mmol/L (ref 3.5–5.1)
SODIUM: 129 mmol/L — AB (ref 135–145)

## 2017-09-16 LAB — PROTIME-INR
INR: 1.97
PROTHROMBIN TIME: 22.2 s — AB (ref 11.4–15.2)

## 2017-09-16 LAB — CBC
HCT: 33.8 % — ABNORMAL LOW (ref 39.0–52.0)
HEMOGLOBIN: 10.6 g/dL — AB (ref 13.0–17.0)
MCH: 30.1 pg (ref 26.0–34.0)
MCHC: 31.4 g/dL (ref 30.0–36.0)
MCV: 96 fL (ref 78.0–100.0)
PLATELETS: 164 10*3/uL (ref 150–400)
RBC: 3.52 MIL/uL — AB (ref 4.22–5.81)
RDW: 18.3 % — ABNORMAL HIGH (ref 11.5–15.5)
WBC: 12.9 10*3/uL — ABNORMAL HIGH (ref 4.0–10.5)

## 2017-09-16 LAB — LACTATE DEHYDROGENASE: LDH: 290 U/L — ABNORMAL HIGH (ref 98–192)

## 2017-09-16 MED ORDER — OCTREOTIDE ACETATE 20 MG IM KIT
20.0000 mg | PACK | INTRAMUSCULAR | Status: DC
  Administered 2017-09-16: 09:00:00 20 mg via INTRAMUSCULAR
  Filled 2017-09-16 (×2): qty 1

## 2017-09-16 NOTE — Progress Notes (Addendum)
Patient presents for 1 week  follow up in [facility] today. Reports no problems with VAD equipment or concerns with drive line.  Vital Signs:  Doppler Pressure 120   Automatc BP: 119/66 (84) HR:68   SPO2:99  %  Weight: 179.6 lb w/o eqt Last weight: 186.4 lb Home weights: 178-185 lbs Dry weight: 81.5 kg (179 lbs)  VAD Indication: Destination Therapy age excluding    VAD interrogation & Equipment Management (reviewed with [name]): Speed:9600 Flow: 5.9 Power:6.5 w    RX:[id number] than 48 PI events on each HD day  Alarms: no clinical alarms Events: 11/13- no ext power. Patient accidentally removed both power sources when going from batteries to wall power.  Fixed speed 9600 Low speed limit: 9000  Primary Controller:  Replace back up battery in 65months. Back up controller:   Replace back up battery in 13 months.  Annual Equipment Maintenance on UBC/PM was performed on 12/2016.   I reviewed the LVAD parameters from today and compared the results to the patient's prior recorded data. LVAD interrogation was NEGATIVE for significant power changes, NEGATIVE for clinical alarms and STABLE for PI events/speed drops. No programming changes were made and pump is functioning within specified parameters. Pt is performing daily controller and system monitor self tests along with completing weekly and monthly maintenance for LVAD equipment.  LVAD equipment check completed and is in good working order. Back-up equipment present. Charged back up battery and performed self-test on equipment.   Exit Site Care: Drive line is being maintained weekly  by VAD Coordinators. Drive line exit site well healed and incorporated. The velour is fully implanted at exit site. Dressing dry and intact. No erythema or drainage. Stabilization device present and accurately applied. Pt denies fever or chills. Pt states they have adequate dressing supplies at home.   Significant Events on VAD Support:   02/2017> GIB- AVM clipped x2 07/2017> renal failure- HD started  Device:Medtronic single lead Therapies: on at 231 bpm Last check:07/31/2017   BP & Labs:  MAP 120 - Doppler is reflecting modified systolic  Hgb 61.9 - No S/S of bleeding. Specifically denies melena/BRBPR or nosebleeds.  LDH stable at 290 with established baseline of 225- 300. Denies tea-colored urine. No power elevations noted on interrogation.   Plan: 1. No changes at this time. 2. Patient is reaching out to [place] at HD center to move to the HD center closer to his home. 3. RTC in 1 week for labs/dressing changes, 2 month for VAD visit.  Balinda Quails RN VAD Coordinator   Office: [phone] 24/7 Emergency VAD Pager: [phone]    Trevor [name] a 71 y.o.malewith a history of CAD s/p CABG x 4 2010, OSA, AS with TAVR 2015, CKD, DM2, paroxysmal atrial fibrillation, asthma, and ischemic cardiomyopathy with Medtronic ICD.   Relocated to [place] from [place] in early 2018. Over the last couple of years, he had been admitted multiple times for CHF. Last admission in [place] was in 1/18. Sounds like the hospitalization was complicated by cardiorenal syndrome and there was consideration of doing dialysis.  He was admitted in to [facility] 11/27/16 with NYHA class IV symptoms and hypotension. He was placed on dual inotropes to facilitate diuresis, cardiac output and renal function.  CT surgery consulted for mechanical support and he was deemed appropriate for LVAD work up. He completed LVAD work up and was approved for HMII LVAD --> DT. On 12/11/16, he had IABP placed to optimize cardiac  output and improve renal function. He then underwent HMII LVAD placement + tricuspid valve repair on 12/14/2015. Post operatively pressors weaned off slowly. He required significant diuresis.  Course was complicated by RV dysfunction and renal dysfunction.  Patient was admitted with lower GI bleeding in 4/18.  He  had been on ASA 325 (increased with mild LDH elevation) and warfarin INR 2-2.5.  He had 4 units PRBCs total.  Colonoscopy showed colonic AVM treated with APC and clipping.  He was sent home off ASA and on warfarin INR goal 2-2.5.   In 5/18, creatinine was noted to increase as high as 3.67.  I cut back on his diuretics but he became volume overloaded.  I increased diuretics again with poor response.  In 5/18, he had RHC showing elevated right and left heart filling pressures and preserved cardiac output. I admitted him for IV diuresis.  Weight and creatinine came down, creatinine was 2.9 when discharged.  Ramp echo was done and speed was increased to 9200 rpm.  Sildenafil was increased to 40 mg tid.  At the next visit, speed was increased to 9400 rpm and torsemide was decreased to 40 mg daily, later increased back to 60 mg daily with increased volume.   He continued to have problems with up and down renal function, requiring up and down adjustment of torsemide.  He finally was admitted to the hospital in 8/18 with intractable volume overload.  Efforts at diuresis were complicated by AKI.  Speed was increased to 9600 rpm in the hospital. He finally had to undergo CVVH, then was started on intermittent HD.  He was discharged home to continue on HD.    He has been noted to have a skin cancer on his neck, will need this removed.  He should be able to continue his coumadin.   He was admitted in 10/18 for inpatient HD due to excessive weight gain.  He is now going to HD 4 times/week.    He returns for followup of LVAD and ESRD today. He remains on doxycycline for URI/bronchitis, still some coughing, no fever.  Mild dyspnea walking a long distance, no problems walking around house or out to car.  MAP controlled at 84. Weight is down 7 lbs.   Labs (3/18): LDH 331 => 401 => 443 => 366 => 313, K 3.6 => 3.2 => 3.5, creatinine 1.98 => 1.74 => 1.8 => 1.79, hgb 8.3 => 8.9 => 8.7 => 8.4, INR 2.11 => 2.4 Labs (4/18):  hgb 9.4 Labs (5/18): LDH 269 => 249, INR 2.57, K 4.5, creatinine 1.92 => 2.71 => 3.67 => 3.43 => 3.1 => 2.93, hgb 9, plts 104 Labs (6/18): K 4.5, creatinine 3.6 => 3.43, hgb 8.5, INR 2.4, LDH 287\ Labs (7/18): K 4.5, creatinine 3.52 => 4, hgb 7.9 => 8.5 Labs (8/18): K 3.8, creatinine 4, hgb 8.5 Labs (9/18): hgb 8.2 => 9 => 9.9 => 10.6, plts 143, LDH 205 => 251  LVAD parameters: See nurse's note above.  I reviewed.   PMH: 1. CAD: s/p CABG in 2010.  2. Aortic stenosis: s/p TAVR in 2015. Valve looked ok on 2/18 echo.  3. Atrial fibrillation: Chronic.  4. Type II diabetes.  5. ESRD  6. Chronic systolic CHF: Ischemic cardiomyopathy with prominent RV dysfunction.  Medtronic ICD.  - Echo (2/18): EF 20-25%, moderately dilated LV, moderately dilated/moderately dysfunctional RV, severe TR.  - Cardiogenic shock 2/18 requiring milrinone, norepinephrine, and IABP.  - Heartmate II LVAD for DT placed 12/13/16.   -  RHC (5/18): mean RA 16, PA 55/22 mean 33, mean PCWP 23, CI 2.59, PVR 3.87 - RHC (8/18): mean RA 17, PA 56/25 mean 37, mean PCWP 20, CI 3.2 Fick/2.1 thermo 7. OSA.  8. Thrombocytopenia: Post-op LVAD, resolved.  9. Tricuspid regurgitation: Severe, s/p repair with LVAD placement in 2/18.  10. Lower GI bleed (4/18): Colonic AVM, treated with APC and clipping.  11. Psoriasis  Social History   Socioeconomic History  . Marital status: Widowed    Spouse name: Not on file  . Number of children: 0  . Years of education: 41  . Highest education level: Not on file  Social Needs  . Financial resource strain: Not on file  . Food insecurity - worry: Not on file  . Food insecurity - inability: Not on file  . Transportation needs - medical: Not on file  . Transportation needs - non-medical: Not on file  Occupational History  . Not on file  Tobacco Use  . Smoking status: Never Smoker  . Smokeless tobacco: Never Used  Substance and Sexual Activity  . Alcohol use: No  . Drug use: No  . Sexual  activity: Not Currently  Other Topics Concern  . Not on file  Social History Narrative   Patient is a widow. Moved to [place] from [place], [place]. Currently lives with his sister Radwan Cowley.    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    Family History  Problem Relation Age of Onset  . Heart failure Father   . Heart attack Father   . Healthy Mother   . Diabetes Paternal Grandfather    ROS: All systems reviewed and negative except as per HPI.   Current Outpatient Medications  Medication Sig Dispense Refill  . albuterol (PROVENTIL HFA;VENTOLIN HFA) 108 (90 Base) MCG/ACT inhaler Inhale 2 puffs into the lungs every 4 (four) hours as needed for wheezing or shortness of breath. 1 Inhaler 5  . albuterol (PROVENTIL) (2.5 MG/3ML) 0.083% nebulizer solution Take 3 mLs (2.5 mg total) by nebulization every 4 (four) hours. And as needed 150 mL 5  . atorvastatin (LIPITOR) 40 MG tablet TAKE 1 TABLET(40 MG) BY MOUTH DAILY 30 tablet 11  . busPIRone (BUSPAR) 5 MG tablet Take 1 tablet (5 mg total) by mouth 2 (two) times daily. 60 tablet 6  . calcitRIOL (ROCALTROL) 0.5 MCG capsule Take 1 capsule (0.5 mcg total) by mouth every other day. 15 capsule 6  . docusate sodium (COLACE) 100 MG capsule Take 1 capsule (100 mg total) by mouth 2 (two) times daily. (Patient taking differently: Take 100 mg daily by mouth. ) 60 capsule 12  . doxycycline (VIBRAMYCIN) 100 MG capsule Take 1 capsule (100 mg total) 2 (two) times daily by mouth. 14 capsule 0  . ferric citrate (AURYXIA) 1 GM 210 MG(Fe) tablet Take 420 mg by mouth 3 (three) times daily with meals.    . fluticasone (FLOVENT HFA) 110 MCG/ACT inhaler Inhale 2 puffs into the lungs 2 (two) times daily. 1 Inhaler 5  . gabapentin (NEURONTIN) 600 MG tablet Take 0.5 tablets (300 mg total) by mouth 2 (two) times daily.    Marland Kitchen guaiFENesin (MUCINEX) 600 MG 12 hr tablet Take 1,200 mg 2 (two) times daily as needed by mouth for cough.    . insulin glargine (LANTUS)  100 unit/mL SOPN Inject 0.2 mLs (20 Units total) into the skin at bedtime. 15 mL 0  . levothyroxine (SYNTHROID, LEVOTHROID) 25 MCG tablet Take 1 tablet (25 mcg  total) by mouth daily before breakfast. 30 tablet 6  . multivitamin (RENA-VIT) TABS tablet Take 1 tablet by mouth at bedtime. 30 tablet 6  . pantoprazole (PROTONIX) 40 MG tablet Take 1 tablet (40 mg total) by mouth daily. 30 tablet 5  . sildenafil (REVATIO) 20 MG tablet Take 2 tablets (40 mg total) 3 (three) times daily by mouth. 180 tablet 6  . traMADol (ULTRAM) 50 MG tablet TAKE 1 TABLET BY MOUTH EVERY 6 HOURS AS NEEDED FOR MODERATE PAIN 30 tablet 3  . traZODone (DESYREL) 50 MG tablet Take 1 tablet (50 mg total) by mouth at bedtime as needed for sleep. 90 tablet 0  . warfarin (COUMADIN) 5 MG tablet Take 7.5 mg (1 and 1/2 tablets) daily except 10 mg (2 tablets) on Tues/Thurs.     Current Facility-Administered Medications  Medication Dose Route Frequency Provider Last Rate Last Dose  . octreotide (SANDOSTATIN LAR) IM injection 10 mg  10 mg Intramuscular Q30 days Larey Dresser, MD   10 mg at 07/15/17 1058  . octreotide (SANDOSTATIN LAR) IM injection 20 mg  20 mg Intramuscular Q28 days Larey Dresser, MD       Facility-Administered Medications Ordered in Other Encounters  Medication Dose Route Frequency Provider Last Rate Last Dose  . octreotide (SANDOSTATIN LAR) IM injection 20 mg  20 mg Intramuscular Q28 days Larey Dresser, MD   20 mg at 09/16/17 0833   BP 119/66   Pulse 68   Temp 98.2 F (36.8 C) (Oral)   Resp 16   Ht 5\' 5"  (1.651 m)   Wt 179 lb 9.6 oz (81.5 kg)   SpO2 99%   BMI 29.89 kg/m   MAP 84 GENERAL: Well appearing this am. NAD.  HEENT: Normal. NECK: Supple, JVP 10 cm. Carotids OK.  CARDIAC:  Mechanical heart sounds with LVAD hum present.  LUNGS:  CTAB, normal effort.  ABDOMEN:  NT, ND, no HSM. No bruits or masses. +BS  LVAD exit site: Well-healed and incorporated. Dressing dry and intact. No erythema or  drainage. Stabilization device present and accurately applied. Driveline dressing changed daily per sterile technique. EXTREMITIES:  Warm and dry. No cyanosis, clubbing, rash.  1+ edema 1/2 to knees bilaterally.  NEUROLOGIC:  Alert & oriented x 3. Cranial nerves grossly intact. Moves all 4 extremities w/o difficulty. Affect pleasant   Skin: Ulceration on left side of neck.     Assessment/Plan: 1. Chronic systolic CHF: Ischemic cardiomyopathy, EF 20-25% with RV dysfunction on 2/18 echo pre-LVAD.  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  Medtronic ICD.  Overall, he has felt much better post-LVAD.  He has had problems with RV failure post-op.  He developed worsening renal dysfunction and finally had to start HD in 8/18.  There has been some difficulty getting adequate fluid off him, currently dialyzing 4 days/week.  Mild volume overload on exam. - Given RV failure, may be helpful to leave him with a bit more fluid, be careful not to take too much fluid off.  - Continue warfarin with INR goal 2-2.5, no ASA.   2. CAD: s/p CABG.  No chest pain.  He is on atorvastatin.  3. ESRD: So far, appears to be tolerating HD though still some difficulty taking off adequate fluid (weight is actually significantly down today).  Waiting for fistula to mature (may not mature with non-pulsatile flow).  4. Atrial fibrillation: Chronic.  On warfarin.  5. 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  6. HTN: MAP controlled.   7. Anemia: Baseline anemia of renal disease/chronic disease but recent bleeding from colonic AVMs. Hemoglobin has been slowly rising.  8. 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  - Continue octreotide injections.  9. Skin cancer: On neck.  Needs excision, does not have to stop warfarin. He is going to have this done by plastic surgery.  10. H/o TAVR: Stable on last echo.   Loralie Champagne 09/16/2017

## 2017-09-16 NOTE — Patient Instructions (Signed)
Take 2.5 warfarin pills tonight then resume previous prescription.  Let us know when you talk to [place].  You look great today.... Keep up the good work with your weight!

## 2017-09-16 NOTE — Addendum Note (Signed)
Encounter addended by: Larey Dresser, MD on: 09/16/2017 11:21 PM  Actions taken: LOS modified

## 2017-09-17 IMAGING — CR DG CHEST 1V PORT
1 series · 1 of 1 positions shown · non-contrast
Comparison: 12/21/2016.

CLINICAL DATA: Left ventricular assist device.

EXAM:
PORTABLE CHEST 1 VIEW

[AP]
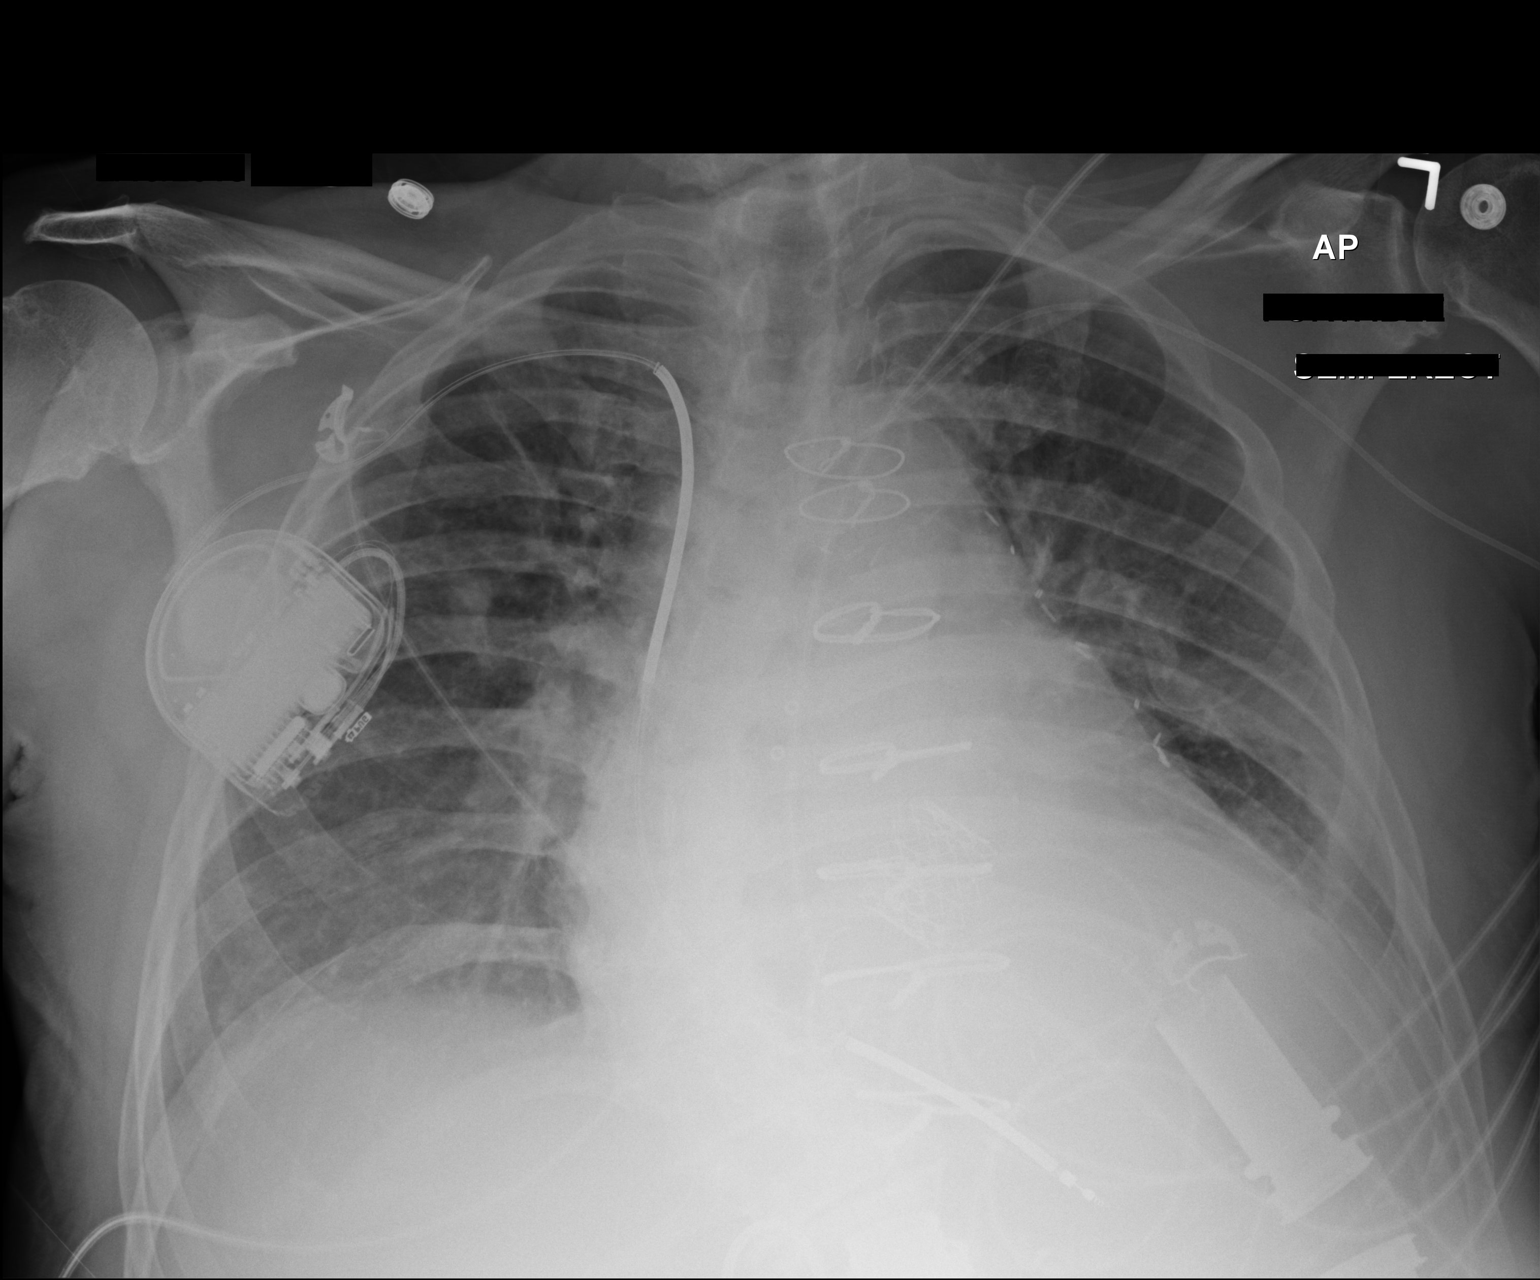

[1 of 1 positions shown; findings below may reference images not displayed]

FINDINGS: Cardiac pacer in stable position. Left ventricular assist device in
stable position. Prior CABG and cardiac valve repair. Cardiomegaly
with mild bilateral pulmonary interstitial prominence noted
suggesting mild CHF . Persistent low lung volumes. Small left
pleural effusion. No pneumothorax.
IMPRESSION: 1. Cardiac pacer and left ventricular assist device in stable
position.

2. Prior CABG and cardiac valve repair. Cardiomegaly with mild
bilateral interstitial prominence of small left pleural effusions
suggesting mild CHF.

3. Low lung volumes.

## 2017-09-19 ENCOUNTER — Other Ambulatory Visit (HOSPITAL_COMMUNITY): Payer: Self-pay | Admitting: Unknown Physician Specialty

## 2017-09-19 DIAGNOSIS — Z95811 Presence of heart assist device: Secondary | ICD-10-CM

## 2017-09-19 DIAGNOSIS — Z7901 Long term (current) use of anticoagulants: Secondary | ICD-10-CM

## 2017-09-19 IMAGING — DX DG CHEST 2V
2 series · 2 of 2 positions shown · non-contrast
Comparison: Portable chest x-ray of 12/23/2016

CLINICAL DATA: Left ventricular assist device, some shortness of
breath, some left-sided chest pain

EXAM:
CHEST  2 VIEW

[chest pa]
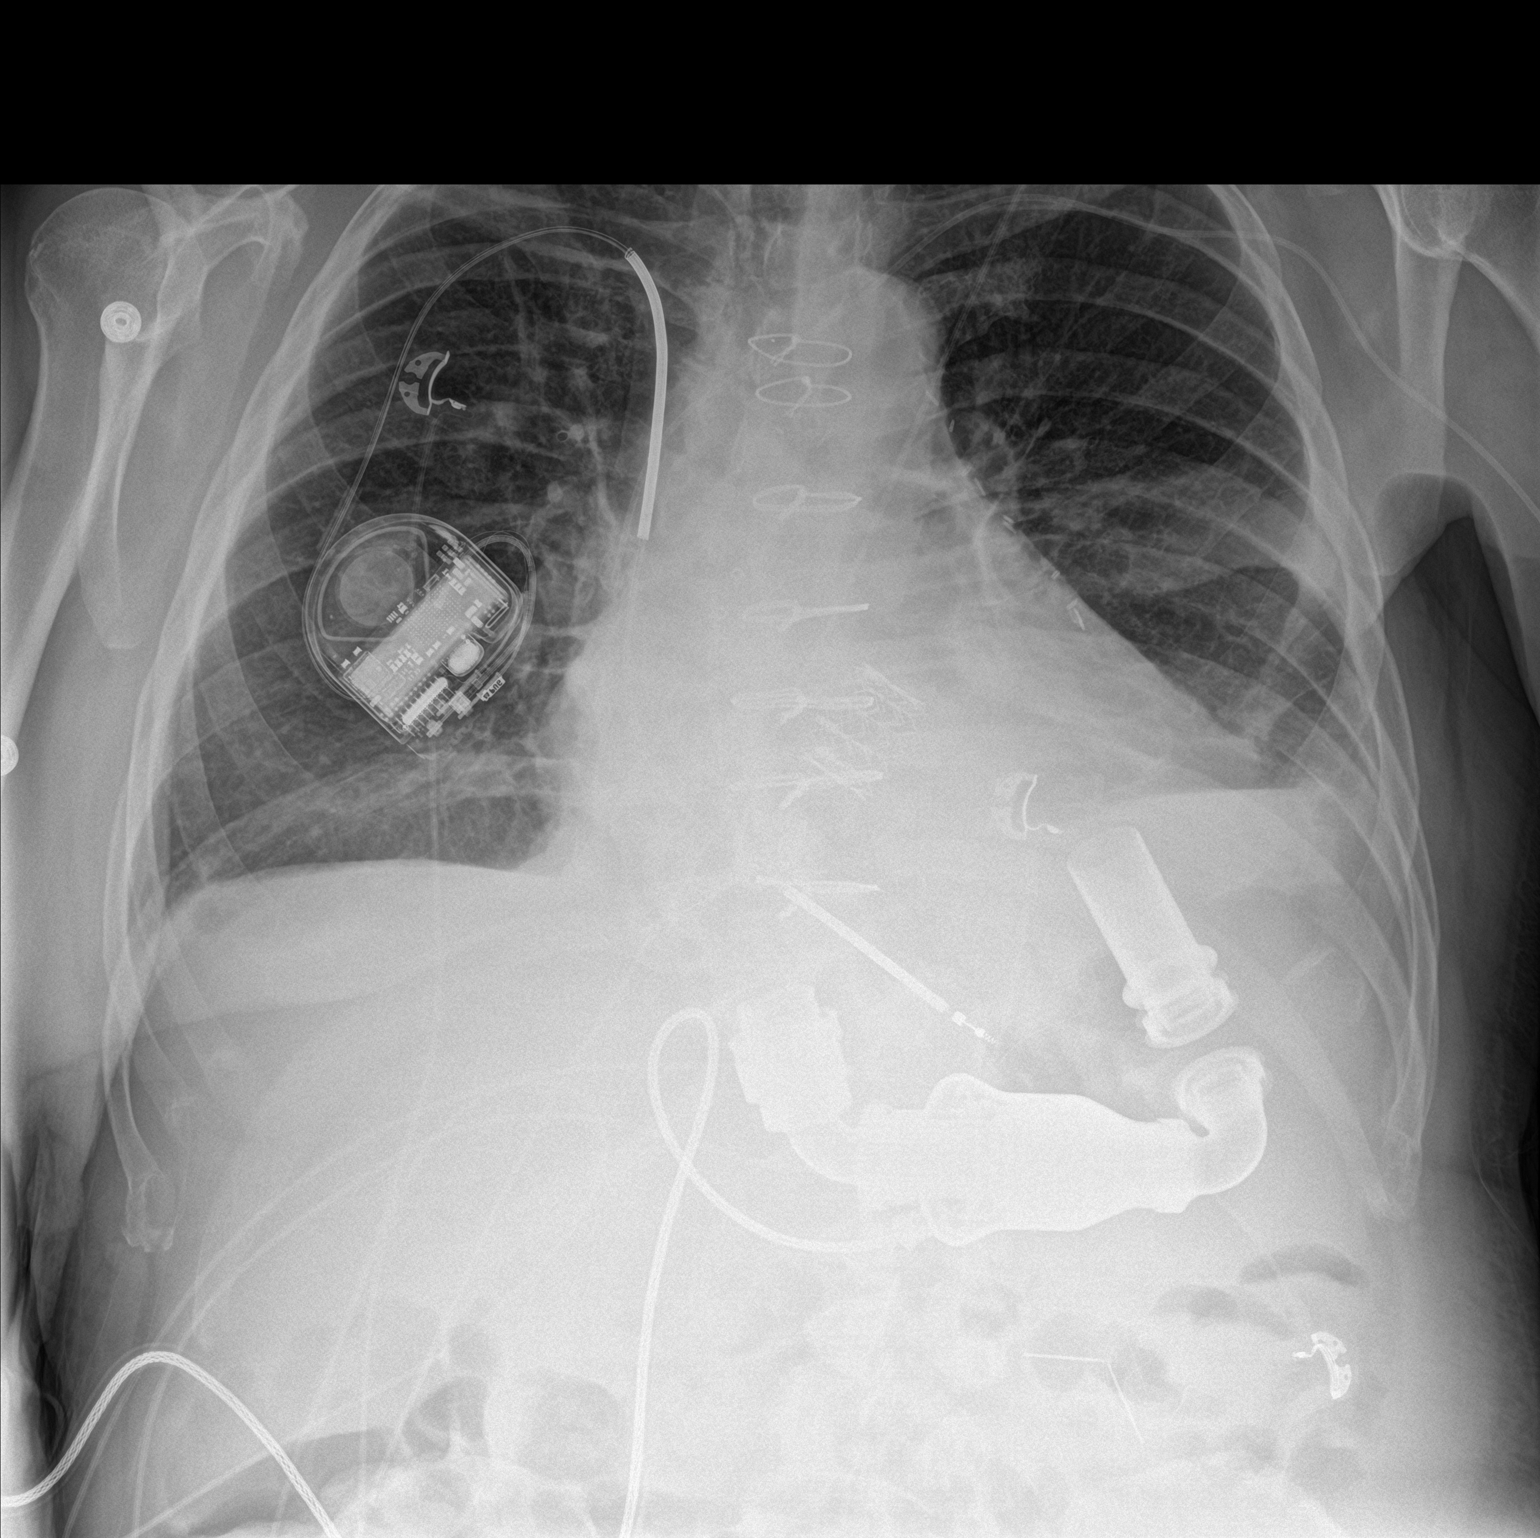

[chest lat]
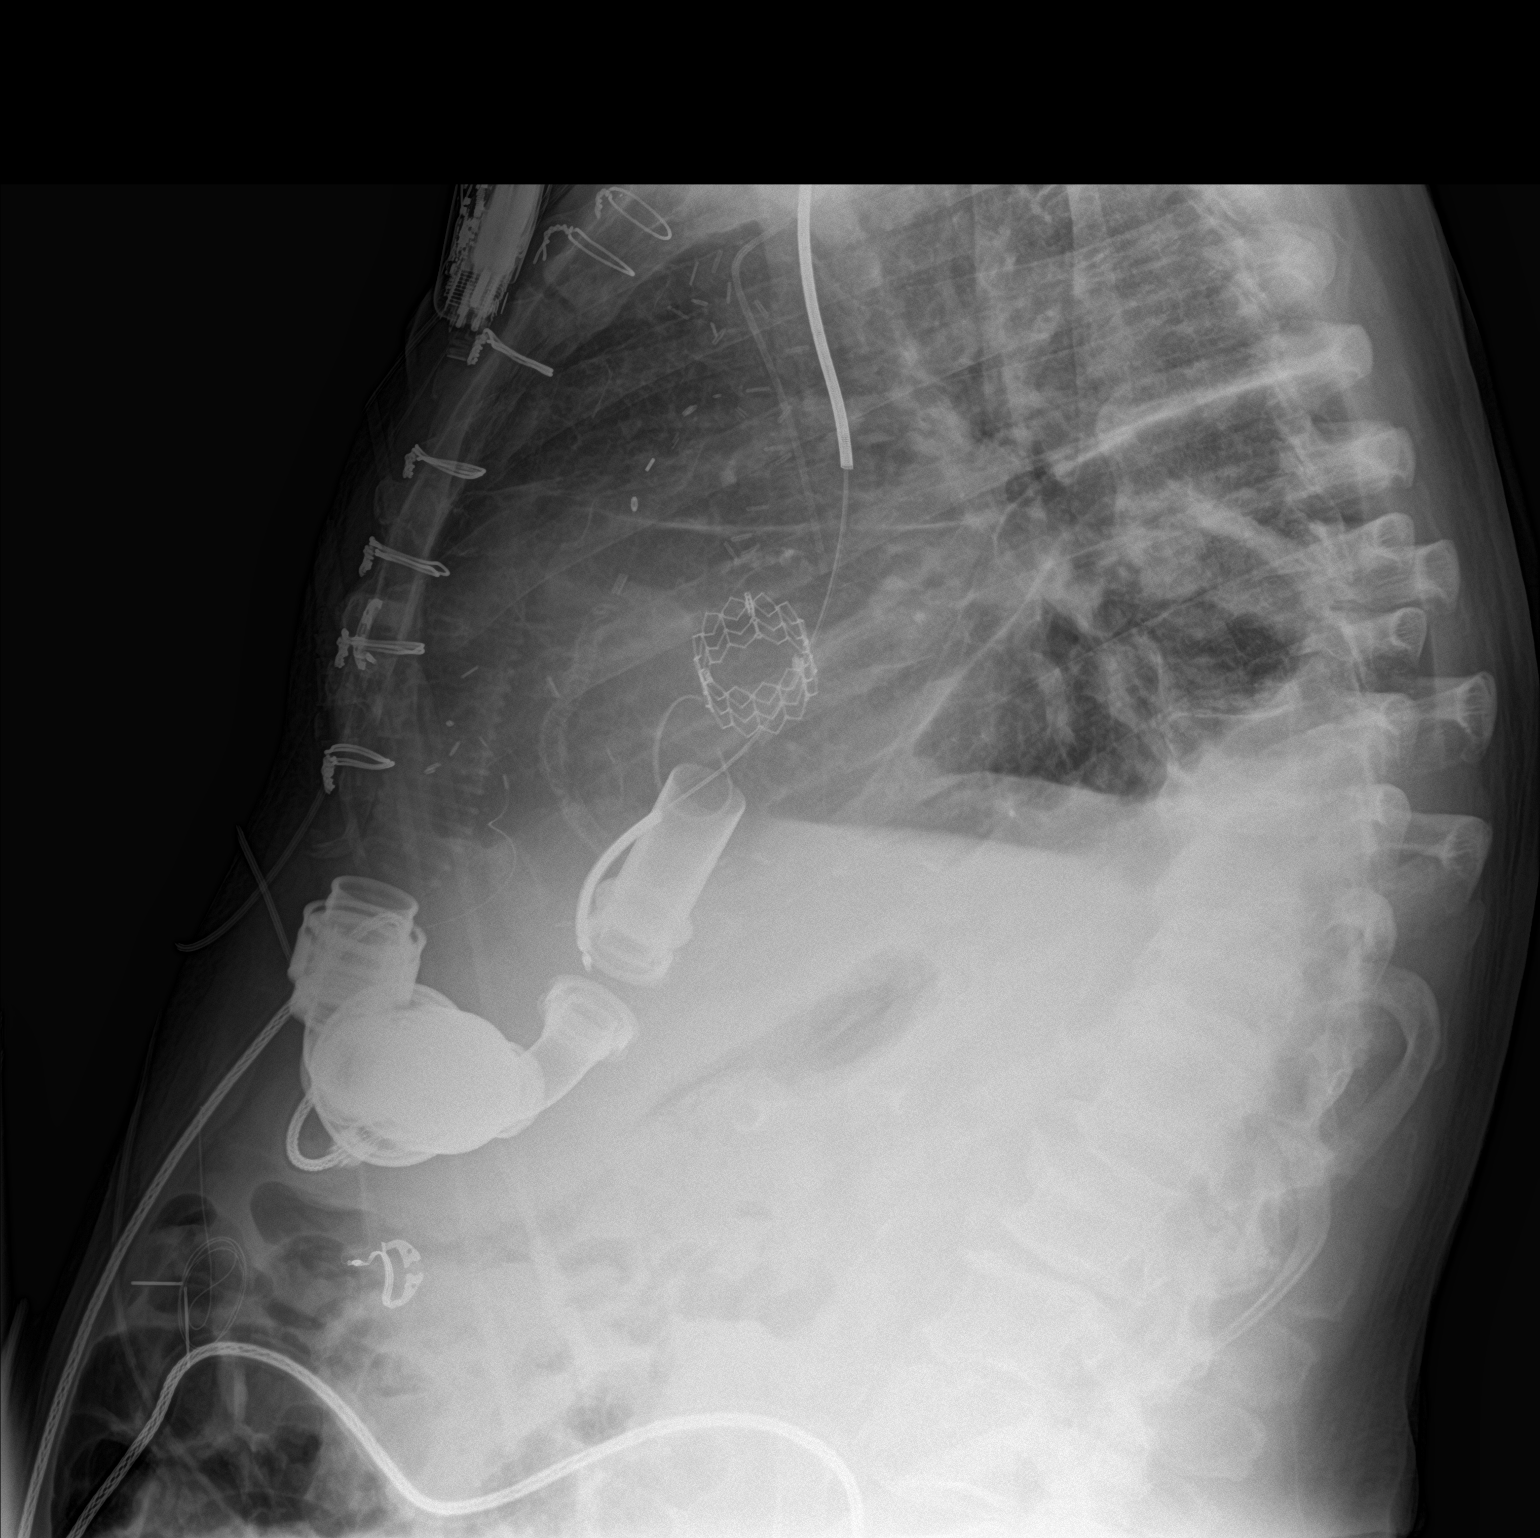

[2 of 2 positions shown; findings below may reference images not displayed]

FINDINGS: Aeration the lungs has improved with some improvement in pulmonary
vascular congestion. Small pleural effusions remain with mild
basilar atelectasis. Opacity in the left mid lung probably
represents atelectasis and some fluid in the fissure as well.
Cardiomegaly is stable. AICD leads remain. LVAD remains. Heart size
is stable with prosthetic cardiac valve present by history
representing a tricuspid valve replacement. Left central venous line
is unchanged in position.
IMPRESSION: 1. Improved aeration with improvement in pulmonary vascular
congestion.
2. Small bilateral pleural effusions are present with mild bibasilar
volume loss.
3. LVAD and AICD leads remain.

## 2017-09-23 ENCOUNTER — Ambulatory Visit (HOSPITAL_COMMUNITY)
Admission: RE | Admit: 2017-09-23 | Discharge: 2017-09-23 | Disposition: A | Discharge status: Home / Self Care | Payer: Medicare Other | Source: Ambulatory Visit | Attending: Internal Medicine | Admitting: Internal Medicine

## 2017-09-23 ENCOUNTER — Other Ambulatory Visit (HOSPITAL_COMMUNITY): Payer: Medicare Other

## 2017-09-23 ENCOUNTER — Ambulatory Visit (HOSPITAL_COMMUNITY): Payer: Self-pay | Admitting: Pharmacist

## 2017-09-23 DIAGNOSIS — Z95811 Presence of heart assist device: Secondary | ICD-10-CM

## 2017-09-23 DIAGNOSIS — Z4800 Encounter for change or removal of nonsurgical wound dressing: Secondary | ICD-10-CM | POA: Diagnosis present

## 2017-09-23 DIAGNOSIS — Z7901 Long term (current) use of anticoagulants: Secondary | ICD-10-CM | POA: Diagnosis not present

## 2017-09-23 LAB — PROTIME-INR
INR: 2.43
PROTHROMBIN TIME: 26.2 s — AB (ref 11.4–15.2)

## 2017-09-23 NOTE — Addendum Note (Signed)
Encounter addended by: Christinia Gully, RN on: 09/23/2017 4:25 PM  Actions taken: Sign clinical note

## 2017-09-23 NOTE — Progress Notes (Signed)
Patient presents to clinic today for drive line exit wound care. Existing VAD dressing removed and site care performed using sterile technique. Drive line exit site cleaned with Chlora prep applicators x 2, allowed to dry, and Sorbaview dressing with bio patch re-applied. Exit site healed and incorporated, the velour is fully implanted at exit site. No redness, tenderness, drainage, foul odor or rash noted. Drive line anchor re-applied. Pt denies fever or chills.   Return in 1 week for another dressing change per standard of care. INR to be repeated in 1-2 weeks pending results per anticoagulation protocol.    [name], VAD Coordinator 24/7 pager [phone]

## 2017-09-24 ENCOUNTER — Other Ambulatory Visit (HOSPITAL_COMMUNITY): Payer: Self-pay | Admitting: Unknown Physician Specialty

## 2017-09-24 DIAGNOSIS — Z95811 Presence of heart assist device: Secondary | ICD-10-CM

## 2017-09-24 DIAGNOSIS — Z7901 Long term (current) use of anticoagulants: Secondary | ICD-10-CM

## 2017-09-24 IMAGING — DX DG CHEST 2V
2 series · 2 of 2 positions shown · non-contrast
Comparison: 12/25/2016

CLINICAL DATA: Pneumonia.

EXAM:
CHEST  2 VIEW

[chest pa]
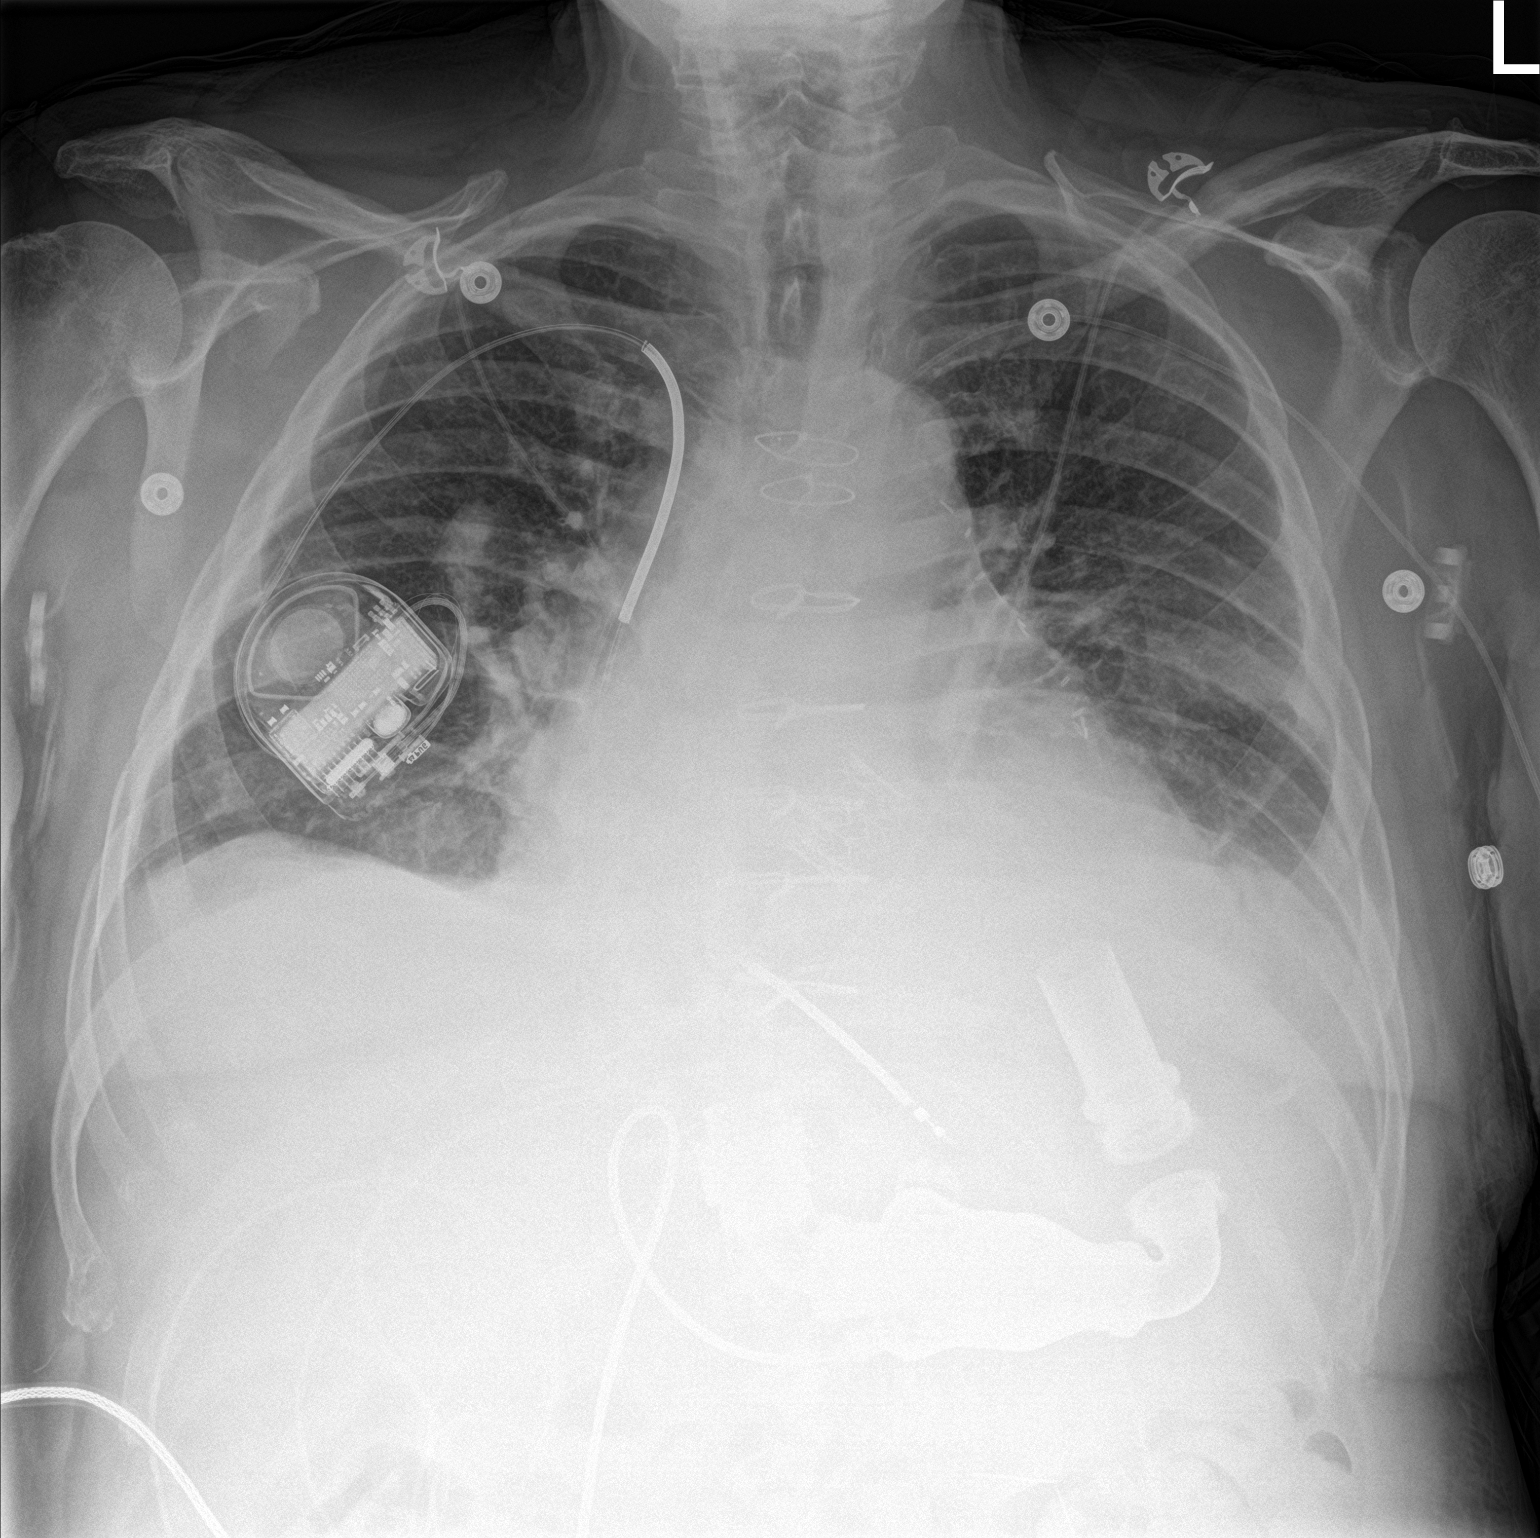

[chest lat]
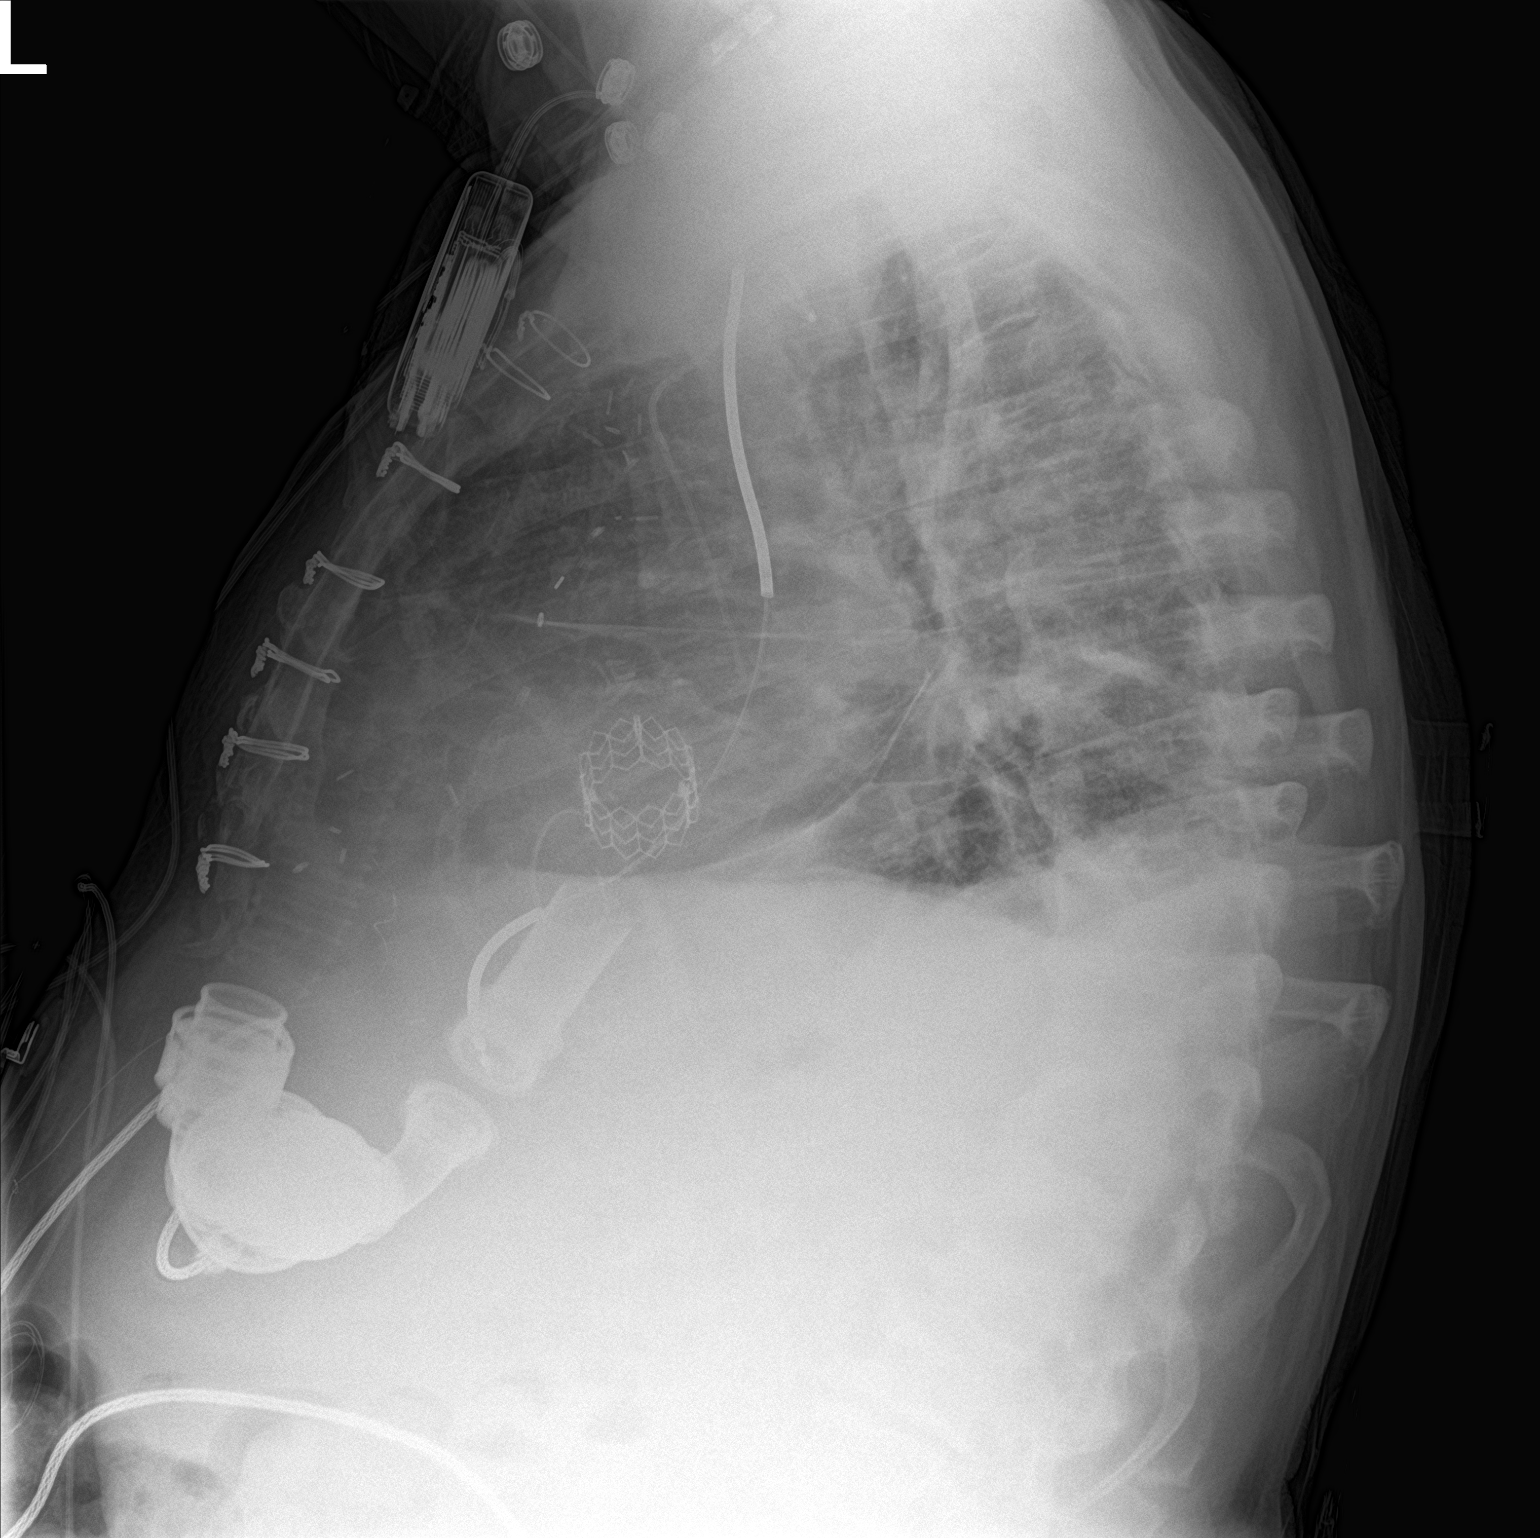

[2 of 2 positions shown; findings below may reference images not displayed]

FINDINGS: Post median sternotomy. Single lead right-sided pacemaker in place.
Left ventricular assist device again seen. Tip of the left upper
extremity PICC in the SVC. Low lung volumes persist. Stable
cardiomegaly with prosthetic valves. Small pleural effusions
persists, but decreased in the interval. Decreasing left midlung
opacity. No new airspace disease. No pulmonary edema. No
pneumothorax. Unchanged osseous structures.
IMPRESSION: 1. Improving small bilateral pleural effusions. Improving left
midlung opacity which may be in part fluid in the fissure.
2. Otherwise stable exam.

## 2017-09-29 ENCOUNTER — Telehealth (HOSPITAL_COMMUNITY): Payer: Self-pay | Admitting: Unknown Physician Specialty

## 2017-09-29 ENCOUNTER — Ambulatory Visit (HOSPITAL_COMMUNITY): Payer: Self-pay | Admitting: Pharmacist

## 2017-09-29 ENCOUNTER — Ambulatory Visit (HOSPITAL_COMMUNITY)
Admission: RE | Admit: 2017-09-29 | Discharge: 2017-09-29 | Disposition: A | Discharge status: Home / Self Care | Payer: Medicare Other | Source: Ambulatory Visit | Attending: Cardiology | Admitting: Cardiology

## 2017-09-29 DIAGNOSIS — Z95811 Presence of heart assist device: Secondary | ICD-10-CM | POA: Diagnosis not present

## 2017-09-29 DIAGNOSIS — Z7901 Long term (current) use of anticoagulants: Secondary | ICD-10-CM

## 2017-09-29 LAB — PROTIME-INR
INR: 2.64
Prothrombin Time: 28 seconds — ABNORMAL HIGH (ref 11.4–15.2)

## 2017-09-29 NOTE — Addendum Note (Signed)
Encounter addended by: Candy Sledge, RN on: 09/29/2017 1:53 PM  Actions taken: Sign clinical note

## 2017-09-29 NOTE — Progress Notes (Signed)
Patient presented to clinic today after HD graft in left arm accessed at HD center. Staff at HD center were concerned due to hematoma noted after being accessed. INR obtained. Patient without complaints. No hematoma present upon my assessment. Will see in clinic tomorrow as previously scheduled.  Balinda Quails RN, VAD Coordinator 24/7 pager [phone]

## 2017-09-29 NOTE — Telephone Encounter (Signed)
Received a call from the Dialysis center stating that the pts AV fistula was accessed and infiltrated today. Nurse states that she placed a pressure dressing on the site. Pt states that the site hurts when he coughs. Pt was instructed to come to VAD clinic after dialysis for an INR check and to assess AV fistula site/pressure dressing.   [name], BSN VAD Coordinator 24/7 Pager [phone]

## 2017-09-30 ENCOUNTER — Other Ambulatory Visit (HOSPITAL_COMMUNITY): Payer: Self-pay | Admitting: Adult Health

## 2017-09-30 ENCOUNTER — Other Ambulatory Visit (HOSPITAL_COMMUNITY): Payer: Medicare Other

## 2017-09-30 ENCOUNTER — Ambulatory Visit (HOSPITAL_COMMUNITY)
Admission: RE | Admit: 2017-09-30 | Discharge: 2017-09-30 | Disposition: A | Discharge status: Home / Self Care | Payer: Medicare Other | Source: Ambulatory Visit | Attending: Internal Medicine | Admitting: Internal Medicine

## 2017-09-30 DIAGNOSIS — Z95811 Presence of heart assist device: Secondary | ICD-10-CM | POA: Diagnosis not present

## 2017-09-30 MED ORDER — TRAZODONE HCL 100 MG PO TABS: 100.0000 mg | ORAL_TABLET | Freq: Every evening | ORAL | 3 refills | Status: DC | PRN

## 2017-09-30 NOTE — Progress Notes (Signed)
Patient presents to clinic today for drive line exit wound care. Existing VAD dressing removed and site care performed using sterile technique. Drive line exit site cleaned with Chlora prep applicators x 2, allowed to dry, and Sorbaview dressing with bio patch re-applied. Exit site healed and incorporated, the velour is fully implanted at exit site. No redness, tenderness, drainage, foul odor or rash noted. Drive line anchor re-applied. Pt denies fever or chills.   Return in 1 week for another dressing change per standard of care. INR to be repeated in 1-2 weeks pending results per anticoagulation protocol.   Patient also states he is not sleeping well. Per Dr Aundra Dubin, will increase Trazodone to 100 mg qhs as needed.  Balinda Quails RN, VAD Coordinator 24/7 pager [phone]

## 2017-10-02 ENCOUNTER — Other Ambulatory Visit: Payer: Self-pay | Admitting: *Deleted

## 2017-10-02 ENCOUNTER — Other Ambulatory Visit (HOSPITAL_COMMUNITY): Payer: Self-pay | Admitting: *Deleted

## 2017-10-02 DIAGNOSIS — Z7901 Long term (current) use of anticoagulants: Secondary | ICD-10-CM

## 2017-10-02 DIAGNOSIS — Z95811 Presence of heart assist device: Secondary | ICD-10-CM

## 2017-10-02 DIAGNOSIS — E079 Disorder of thyroid, unspecified: Secondary | ICD-10-CM

## 2017-10-02 MED ORDER — LEVOTHYROXINE SODIUM 25 MCG PO TABS: 25.0000 ug | ORAL_TABLET | Freq: Every day | ORAL | 3 refills | Status: AC

## 2017-10-06 ENCOUNTER — Other Ambulatory Visit: Payer: Self-pay | Admitting: Pharmacist

## 2017-10-07 ENCOUNTER — Ambulatory Visit (HOSPITAL_COMMUNITY)
Admission: RE | Admit: 2017-10-07 | Discharge: 2017-10-07 | Disposition: A | Discharge status: Home / Self Care | Payer: Medicare Other | Source: Ambulatory Visit | Attending: Cardiology | Admitting: Cardiology

## 2017-10-07 ENCOUNTER — Other Ambulatory Visit (HOSPITAL_COMMUNITY): Payer: Self-pay | Admitting: *Deleted

## 2017-10-07 ENCOUNTER — Ambulatory Visit (HOSPITAL_COMMUNITY): Payer: Self-pay | Admitting: Pharmacist

## 2017-10-07 DIAGNOSIS — I5043 Acute on chronic combined systolic (congestive) and diastolic (congestive) heart failure: Secondary | ICD-10-CM

## 2017-10-07 DIAGNOSIS — Z7901 Long term (current) use of anticoagulants: Secondary | ICD-10-CM | POA: Insufficient documentation

## 2017-10-07 DIAGNOSIS — Z95811 Presence of heart assist device: Secondary | ICD-10-CM

## 2017-10-07 LAB — PREALBUMIN: Prealbumin: 19.7 mg/dL (ref 18–38)

## 2017-10-07 LAB — PROTIME-INR
INR: 5.92 — AB
PROTHROMBIN TIME: 52.5 s — AB (ref 11.4–15.2)

## 2017-10-10 ENCOUNTER — Other Ambulatory Visit (HOSPITAL_COMMUNITY): Payer: Self-pay | Admitting: Unknown Physician Specialty

## 2017-10-10 DIAGNOSIS — Z95811 Presence of heart assist device: Secondary | ICD-10-CM

## 2017-10-10 DIAGNOSIS — Z7901 Long term (current) use of anticoagulants: Secondary | ICD-10-CM

## 2017-10-13 ENCOUNTER — Encounter (HOSPITAL_COMMUNITY): Payer: Medicare Other

## 2017-10-14 ENCOUNTER — Other Ambulatory Visit (HOSPITAL_COMMUNITY): Payer: Medicare Other

## 2017-10-15 ENCOUNTER — Ambulatory Visit (HOSPITAL_COMMUNITY)
Admission: RE | Admit: 2017-10-15 | Discharge: 2017-10-15 | Disposition: A | Discharge status: Home / Self Care | Payer: Medicare Other | Source: Ambulatory Visit | Attending: Internal Medicine | Admitting: Internal Medicine

## 2017-10-15 ENCOUNTER — Ambulatory Visit (HOSPITAL_COMMUNITY): Payer: Self-pay | Admitting: Pharmacist

## 2017-10-15 DIAGNOSIS — Z7901 Long term (current) use of anticoagulants: Secondary | ICD-10-CM | POA: Diagnosis not present

## 2017-10-15 DIAGNOSIS — Z95811 Presence of heart assist device: Secondary | ICD-10-CM | POA: Insufficient documentation

## 2017-10-15 LAB — PROTIME-INR
INR: 2.67
PROTHROMBIN TIME: 28.2 s — AB (ref 11.4–15.2)

## 2017-10-15 NOTE — Addendum Note (Signed)
Encounter addended by: Lezlie Octave, RN on: 10/15/2017 3:27 PM  Actions taken: Sign clinical note

## 2017-10-15 NOTE — Progress Notes (Signed)
Patient presents to clinic today for drive line exit wound care. Existing VAD dressing removed and site care performed using sterile technique. Drive line exit site cleaned with Chlora prep applicators x 2, allowed to dry, and Sorbaview dressing with bio patch re-applied, and covered with Tegaderm dressings x 2. Exit site healed and incorporated, the velour is fully implanted at exit site. No redness, tenderness, drainage, foul odor or rash noted. Drive line anchor re-applied. Pt denies fever or chills.   Return in 1 week for another dressing change per standard of care. INR to be repeated in 1-2 weeks pending results per anticoagulation protocol.   [name] RN, VAD Coordinator 24/7 pager [phone]

## 2017-10-17 ENCOUNTER — Other Ambulatory Visit (HOSPITAL_COMMUNITY): Payer: Self-pay | Admitting: Unknown Physician Specialty

## 2017-10-17 DIAGNOSIS — Z7901 Long term (current) use of anticoagulants: Secondary | ICD-10-CM

## 2017-10-17 DIAGNOSIS — Z95811 Presence of heart assist device: Secondary | ICD-10-CM

## 2017-10-21 ENCOUNTER — Ambulatory Visit (HOSPITAL_COMMUNITY)
Admission: RE | Admit: 2017-10-21 | Discharge: 2017-10-21 | Disposition: A | Discharge status: Home / Self Care | Payer: Medicare Other | Source: Ambulatory Visit | Attending: Cardiology | Admitting: Cardiology

## 2017-10-21 ENCOUNTER — Ambulatory Visit (HOSPITAL_COMMUNITY): Payer: Self-pay | Admitting: Pharmacist

## 2017-10-21 ENCOUNTER — Encounter (HOSPITAL_COMMUNITY)
Admission: RE | Admit: 2017-10-21 | Discharge: 2017-10-21 | Disposition: A | Discharge status: Home / Self Care | Payer: Medicare Other | Source: Ambulatory Visit | Attending: Cardiology | Admitting: Cardiology

## 2017-10-21 DIAGNOSIS — Z4800 Encounter for change or removal of nonsurgical wound dressing: Secondary | ICD-10-CM | POA: Insufficient documentation

## 2017-10-21 DIAGNOSIS — Z95811 Presence of heart assist device: Secondary | ICD-10-CM | POA: Insufficient documentation

## 2017-10-21 DIAGNOSIS — Z7901 Long term (current) use of anticoagulants: Secondary | ICD-10-CM | POA: Diagnosis present

## 2017-10-21 LAB — PROTIME-INR
INR: 2.32
Prothrombin Time: 25.2 seconds — ABNORMAL HIGH (ref 11.4–15.2)

## 2017-10-21 MED ORDER — OCTREOTIDE ACETATE 20 MG IM KIT
10.0000 mg | PACK | INTRAMUSCULAR | Status: AC
  Administered 2017-10-21: 10 mg via INTRAMUSCULAR
  Filled 2017-10-21 (×2): qty 1

## 2017-10-21 NOTE — Progress Notes (Signed)
Patient presents to clinic today for drive line exit wound care. Existing VAD dressing removed and site care performed using sterile technique. Drive line exit site cleaned with Chlora prep applicators x 2, allowed to dry, and Sorbaview dressing with bio patch re-applied. Exit site healed and incorporated, the velour is fully implanted at exit site. No redness, tenderness, drainage, foul odor or rash noted. Drive line anchor re-applied. Pt denies fever or chills.   Return in 1 week for another dressing change per standard of care. INR to be repeated in 1-2 weeks pending results per anticoagulation protocol.   Patient states he  has a poor quality of life with his current HD schedule and has requested to switch centers. Will call Alinda Deem, at the [address] center. Gave him emotional support and encouragement.   Spoke with Dr Eusebio Friendly scheduler and requested to move patient skin cancer surgery to a non-HD day. Currently it is now scheduled on 11/20/2017 with plans to admit 2 days prior for Heparin bridge. Patient updated. Dr Aundra Dubin made aware.  Balinda Quails RN, VAD Coordinator 24/7 pager [phone]

## 2017-10-21 NOTE — Addendum Note (Signed)
Encounter addended by: Candy Sledge, RN on: 10/21/2017 2:30 PM  Actions taken: Sign clinical note

## 2017-10-23 ENCOUNTER — Other Ambulatory Visit (HOSPITAL_COMMUNITY): Payer: Medicare Other

## 2017-10-29 ENCOUNTER — Other Ambulatory Visit (HOSPITAL_COMMUNITY): Payer: Self-pay | Admitting: *Deleted

## 2017-10-29 DIAGNOSIS — Z7901 Long term (current) use of anticoagulants: Secondary | ICD-10-CM

## 2017-10-29 DIAGNOSIS — Z95811 Presence of heart assist device: Secondary | ICD-10-CM

## 2017-10-30 ENCOUNTER — Ambulatory Visit (HOSPITAL_COMMUNITY): Payer: Self-pay | Admitting: Pharmacist

## 2017-10-30 ENCOUNTER — Ambulatory Visit (HOSPITAL_COMMUNITY)
Admission: RE | Admit: 2017-10-30 | Discharge: 2017-10-30 | Disposition: A | Discharge status: Home / Self Care | Payer: Medicare Other | Source: Ambulatory Visit | Attending: Internal Medicine | Admitting: Internal Medicine

## 2017-10-30 DIAGNOSIS — Z7901 Long term (current) use of anticoagulants: Secondary | ICD-10-CM | POA: Diagnosis present

## 2017-10-30 DIAGNOSIS — Z95811 Presence of heart assist device: Secondary | ICD-10-CM | POA: Diagnosis not present

## 2017-10-30 LAB — PROTIME-INR
INR: 2.18
PROTHROMBIN TIME: 24.1 s — AB (ref 11.4–15.2)

## 2017-10-30 NOTE — Progress Notes (Signed)
Patient presents to clinic today for drive line exit wound care. Existing VAD dressing removed and site care performed using sterile technique. Drive line exit site cleaned with Chlora prep applicators x 2, allowed to dry, and Sorbaview dressing with bio patch re-applied, and covered with Tegaderm dressings x 2. Exit site healed and incorporated, the velour is fully implanted at exit site. No redness, tenderness, drainage, foul odor or rash noted. Drive line anchor re-applied. Pt denies fever or chills.   Return in 1 week for another dressing change per standard of care. INR to be repeated in 1-2 weeks pending results per anticoagulation protocol.   [name] RN, VAD Coordinator 24/7 pager [phone]

## 2017-10-30 NOTE — Addendum Note (Signed)
Encounter addended by: Lezlie Octave, RN on: 10/30/2017 11:28 AM  Actions taken: Sign clinical note

## 2017-10-31 ENCOUNTER — Other Ambulatory Visit (HOSPITAL_COMMUNITY): Payer: Self-pay | Admitting: Unknown Physician Specialty

## 2017-10-31 DIAGNOSIS — Z95811 Presence of heart assist device: Secondary | ICD-10-CM

## 2017-10-31 DIAGNOSIS — Z7901 Long term (current) use of anticoagulants: Secondary | ICD-10-CM

## 2017-11-06 ENCOUNTER — Ambulatory Visit (HOSPITAL_COMMUNITY): Payer: Self-pay | Admitting: Pharmacist

## 2017-11-06 ENCOUNTER — Other Ambulatory Visit (HOSPITAL_COMMUNITY): Payer: Self-pay | Admitting: *Deleted

## 2017-11-06 ENCOUNTER — Ambulatory Visit (HOSPITAL_COMMUNITY)
Admission: RE | Admit: 2017-11-06 | Discharge: 2017-11-06 | Disposition: A | Discharge status: Home / Self Care | Payer: Medicare Other | Source: Ambulatory Visit | Attending: Adult Health | Admitting: Adult Health

## 2017-11-06 ENCOUNTER — Ambulatory Visit (HOSPITAL_COMMUNITY)
Admission: RE | Admit: 2017-11-06 | Discharge: 2017-11-06 | Disposition: A | Discharge status: Home / Self Care | Payer: Medicare Other | Source: Ambulatory Visit | Attending: Cardiology | Admitting: Cardiology

## 2017-11-06 DIAGNOSIS — R05 Cough: Secondary | ICD-10-CM | POA: Diagnosis present

## 2017-11-06 DIAGNOSIS — I5022 Chronic systolic (congestive) heart failure: Secondary | ICD-10-CM | POA: Insufficient documentation

## 2017-11-06 DIAGNOSIS — I517 Cardiomegaly: Secondary | ICD-10-CM | POA: Diagnosis not present

## 2017-11-06 DIAGNOSIS — Z48812 Encounter for surgical aftercare following surgery on the circulatory system: Secondary | ICD-10-CM | POA: Diagnosis not present

## 2017-11-06 DIAGNOSIS — R0989 Other specified symptoms and signs involving the circulatory and respiratory systems: Secondary | ICD-10-CM | POA: Diagnosis not present

## 2017-11-06 DIAGNOSIS — Z95811 Presence of heart assist device: Secondary | ICD-10-CM | POA: Diagnosis present

## 2017-11-06 DIAGNOSIS — Z7901 Long term (current) use of anticoagulants: Secondary | ICD-10-CM

## 2017-11-06 LAB — PROTIME-INR
INR: 2.22
Prothrombin Time: 24.5 seconds — ABNORMAL HIGH (ref 11.4–15.2)

## 2017-11-06 MED ORDER — OCTREOTIDE ACETATE 20 MG IM KIT: 10.0000 mg | PACK | INTRAMUSCULAR | Status: DC

## 2017-11-06 MED ORDER — DOXYCYCLINE HYCLATE 100 MG PO TABS: 100.0000 mg | ORAL_TABLET | Freq: Two times a day (BID) | ORAL | 0 refills | Status: DC

## 2017-11-06 NOTE — Addendum Note (Signed)
Encounter addended by: Christinia Gully, RN on: 11/06/2017 10:37 AM  Actions taken: Visit diagnoses modified, Diagnosis association updated, Order list changed, Sign clinical note

## 2017-11-06 NOTE — Progress Notes (Addendum)
Patient presents to clinic today for drive line exit wound care. Existing VAD dressing removed and site care performed using sterile technique. Drive line exit site cleaned with Chlora prep applicators x 2, allowed to dry, and Sorbaview dressing with bio patch re-applied, and covered with Tegaderm dressings x 2. Exit site healed and incorporated, the velour is fully implanted at exit site. No redness, tenderness, drainage, foul odor or rash noted. Drive line anchor re-applied. Pt denies fever or chills.   Pt is c/o of congestion and cough and coughing up yellow sputum. Amy Clegg in to assess pt. We will send pt for a cxray. Per Darrick Grinder will start pt on Doxycycline 100 mg BID for 7 days. This was sent to [facility].   Return in 1 week for another dressing change per standard of care. INR to be repeated in 1-2 weeks pending results per anticoagulation protocol.   [name], VAD Coordinator 24/7 pager [phone]

## 2017-11-07 ENCOUNTER — Other Ambulatory Visit (HOSPITAL_COMMUNITY): Payer: Self-pay | Admitting: Unknown Physician Specialty

## 2017-11-07 DIAGNOSIS — Z95811 Presence of heart assist device: Secondary | ICD-10-CM

## 2017-11-07 DIAGNOSIS — Z7901 Long term (current) use of anticoagulants: Secondary | ICD-10-CM

## 2017-11-13 ENCOUNTER — Other Ambulatory Visit (HOSPITAL_COMMUNITY): Payer: Self-pay | Admitting: *Deleted

## 2017-11-13 ENCOUNTER — Encounter (HOSPITAL_COMMUNITY)
Admission: RE | Admit: 2017-11-13 | Discharge: 2017-11-13 | Disposition: A | Discharge status: Home / Self Care | Payer: Medicare Other | Source: Ambulatory Visit | Attending: Cardiology | Admitting: Cardiology

## 2017-11-13 ENCOUNTER — Ambulatory Visit (HOSPITAL_COMMUNITY): Payer: Self-pay | Admitting: Pharmacist

## 2017-11-13 ENCOUNTER — Ambulatory Visit (HOSPITAL_COMMUNITY)
Admission: RE | Admit: 2017-11-13 | Discharge: 2017-11-13 | Disposition: A | Discharge status: Home / Self Care | Payer: Medicare Other | Source: Ambulatory Visit | Attending: Internal Medicine | Admitting: Internal Medicine

## 2017-11-13 ENCOUNTER — Encounter (HOSPITAL_COMMUNITY): Payer: Self-pay | Admitting: Unknown Physician Specialty

## 2017-11-13 DIAGNOSIS — Z95811 Presence of heart assist device: Secondary | ICD-10-CM | POA: Insufficient documentation

## 2017-11-13 DIAGNOSIS — K922 Gastrointestinal hemorrhage, unspecified: Secondary | ICD-10-CM

## 2017-11-13 DIAGNOSIS — Z7901 Long term (current) use of anticoagulants: Secondary | ICD-10-CM | POA: Diagnosis not present

## 2017-11-13 LAB — PROTIME-INR
INR: 2.49
PROTHROMBIN TIME: 26.7 s — AB (ref 11.4–15.2)

## 2017-11-13 MED ORDER — OCTREOTIDE ACETATE 20 MG IM KIT
10.0000 mg | PACK | INTRAMUSCULAR | Status: DC
  Filled 2017-11-13: qty 1

## 2017-11-13 MED ORDER — OCTREOTIDE ACETATE 20 MG IM KIT
20.0000 mg | PACK | INTRAMUSCULAR | Status: AC
  Administered 2017-11-13: 10:00:00 20 mg via INTRAMUSCULAR
  Filled 2017-11-13: qty 1

## 2017-11-13 MED ORDER — OCTREOTIDE ACETATE 20 MG IM KIT: 20.0000 mg | PACK | INTRAMUSCULAR | Status: DC

## 2017-11-13 NOTE — Addendum Note (Signed)
Encounter addended by: Candy Sledge, RN on: 11/13/2017 9:28 AM  Actions taken: Sign clinical note

## 2017-11-13 NOTE — Progress Notes (Signed)
Admission called into patient placement for admission on 1/15 prior to skin cancer removal scheduled on 1/17 with [name].  [name], BSN VAD Coordinator 24/7 Pager [phone]

## 2017-11-13 NOTE — Progress Notes (Signed)
Patient presents to clinic today for drive line exit wound care. Existing VAD dressing removed and site care performed using sterile technique. Drive line exit site cleaned with Chlora prep applicators x 2, allowed to dry, and Sorbaview dressing with bio patch re-applied. Exit site healed and incorporated, the velour is fully implanted at exit site. No redness, tenderness, drainage, foul odor or rash noted. Drive line anchor re-applied. Pt denies fever or chills.   Return in 1 week for another dressing change per standard of care. INR to be repeated in 1-2 weeks pending results per anticoagulation protocol.    [name] RN, VAD Coordinator 24/7 pager [phone]  

## 2017-11-13 NOTE — Addendum Note (Signed)
Encounter addended by: Candy Sledge, RN on: 11/13/2017 9:29 AM  Actions taken: Sign clinical note

## 2017-11-14 ENCOUNTER — Ambulatory Visit: Payer: Self-pay | Admitting: Plastic Surgery

## 2017-11-14 DIAGNOSIS — C4490 Unspecified malignant neoplasm of skin, unspecified: Secondary | ICD-10-CM

## 2017-11-14 NOTE — H&P (Signed)
Trevor [name] is an 72 y.o. male.   Chief Complaint: skin cancer HPI: The patient is a 72 y.o. yrs old wm here for history and physical for skin excision for two SCC. One on the left neck and one on the right leg.  He will be admitted and undergo dialysis with change over to the heparin.  We will do the surgery the following day and he will be discharged after dialysis 1-2 days later.  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  He states the lesions had been present for ~ 9 months and getting tender, larger and ulcerating.  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 Prescott Gum.  He has thyroid disease and DM.  He is on coumadin.  He has a history of both melanoma and skin cancer according to the records.  The area on his left neck is ~ 3 x 4 cm and ulcerated in the center with raised edges.  The area on the right leg is 2 cm with mild bleedin  Past Medical History:  Diagnosis Date  . AICD (automatic cardioverter/defibrillator) present   . Asthma   . AVM (arteriovenous malformation) of colon 07/07/2017  . CHF (congestive heart failure) ([place])   . Diabetes ([place])   . ESRF (end stage renal failure) ([place]) 07/07/2017  . Kidney disease   . Permanent atrial fibrillation ([place]) 07/07/2017  . Presence of permanent cardiac pacemaker    AICD  . Sleep apnea     Past Surgical History:  Procedure Laterality Date  . AV FISTULA PLACEMENT Left 06/30/2017   Procedure: CREATION of LEFT ARM Brachiocephalic Fistula;  Surgeon: Conrad [place], MD;  Location: [facility];  Service: Vascular;  Laterality: Left;  . CARDIAC CATHETERIZATION N/A 12/02/2016   Procedure: Right Heart Cath;  Surgeon: Larey Dresser, MD;  Location: [facility];  Service: Cardiovascular;  Laterality: N/A;  . COLONOSCOPY N/A 02/14/2017   Procedure: COLONOSCOPY;  Surgeon: Milus Banister, MD;  Location: [facility];  Service: Endoscopy;  Laterality: N/A;  . DIALYSIS/PERMA CATHETER INSERTION  06/30/2017   Procedure: INSERTION Dialysis Catheter;  Surgeon: Conrad [place], MD;  Location: [facility];  Service: Vascular;;  . IABP INSERTION N/A 12/11/2016   Procedure: IABP Insertion;  Surgeon: Larey Dresser, MD;  Location: [facility];  Service: Cardiovascular;  Laterality: N/A;  . INSERTION OF IMPLANTABLE LEFT VENTRICULAR ASSIST DEVICE N/A 12/13/2016   Procedure: INSERTION OF IMPLANTABLE LEFT VENTRICULAR ASSIST DEVICE;  Surgeon: Ivin Poot, MD;  Location: [facility];  Service: Open Heart Surgery;  Laterality: N/A;  HEARTMATE II  NITRIC OXIDE  . IR FLUORO GUIDE CV LINE LEFT  06/17/2017  . IR US GUIDE VASC ACCESS LEFT  06/17/2017  . RIGHT HEART CATH N/A 12/11/2016   Procedure: Right Heart Cath;  Surgeon: Larey Dresser, MD;  Location: [facility];  Service: Cardiovascular;  Laterality: N/A;  . RIGHT HEART CATH N/A 03/27/2017   Procedure: Right Heart Cath;  Surgeon: Larey Dresser, MD;  Location: [facility];  Service: Cardiovascular;  Laterality: N/A;  . RIGHT HEART CATH N/A 06/13/2017   Procedure: RIGHT HEART CATH;  Surgeon: Jolaine Artist, MD;  Location: [facility];  Service: Cardiovascular;  Laterality: N/A;  . TEE WITHOUT CARDIOVERSION N/A 12/13/2016  Procedure: TRANSESOPHAGEAL ECHOCARDIOGRAM (TEE);  Surgeon: Ivin Poot, MD;  Location: [facility];  Service: Open Heart Surgery;  Laterality: N/A;  . TRICUSPID VALVE REPLACEMENT N/A 12/13/2016   Procedure: TRICUSPID VALVE REPAIR WITH EDWARDS MC 3 TRICUSPID ANNULOPLASTY RING MODEL 4900 SIZE T 28;  Surgeon: Ivin Poot, MD;  Location: [facility];  Service: Open Heart Surgery;  Laterality: N/A;    Family History  Problem Relation Age of Onset  . Heart failure Father   . Heart attack Father   . Healthy Mother   . Diabetes Paternal Grandfather    Social History:  reports that  has never smoked. he has  never used smokeless tobacco. He reports that he does not drink alcohol or use drugs.  Allergies: No Known Allergies   (Not in a hospital admission)  Results for orders placed or performed during the hospital encounter of 11/13/17 (from the past 48 hour(s))  Protime-INR     Status: Abnormal   Collection Time: 11/13/17  8:51 AM  Result Value Ref Range   Prothrombin Time 26.7 (H) 11.4 - 15.2 seconds   INR 2.49    No results found.  Review of Systems  Constitutional: Negative.   HENT: Negative.   Eyes: Negative.   Respiratory: Negative.   Cardiovascular: Negative.   Genitourinary: Negative.   Musculoskeletal: Negative.   Skin: Negative.   Neurological: Negative.   Psychiatric/Behavioral: Negative.     There were no vitals taken for this visit. Physical Exam  Constitutional: He is oriented to person, place, and time. He appears well-developed and well-nourished.  HENT:  Head: Normocephalic and atraumatic.  Eyes: EOM are normal. Pupils are equal, round, and reactive to light.  Cardiovascular: Normal rate.  Respiratory: Effort normal.  GI: Soft.  Neurological: He is alert and oriented to person, place, and time.  Skin: Skin is warm.  Psychiatric: He has a normal mood and affect. His behavior is normal. Judgment and thought content normal.     Assessment/Plan  Plan for excision of left neck and right leg skin cancer. The risks that can be encountered with and after excision of a skin lesion were discussed and include the following but not limited to these: bleeding, infection, delayed healing, anesthesia risks, skin sensation changes, injury to structures including nerves, blood vessels, and muscles which may be temporary or permanent, allergies to tape, suture materials and glues, blood products, topical preparations or injected agents, skin contour irregularities, skin discoloration and swelling, deep vein thrombosis, cardiac and pulmonary complications, pain, which may  persist, persistent pain, recurrence of the lesion, poor healing of the incision, possible need for revisional surgery or staged procedures.   [place], DO 11/14/2017, 2:03 PM

## 2017-11-14 NOTE — H&P (View-Only) (Signed)
Trevor [name] is an 72 y.o. male.   Chief Complaint: skin cancer HPI: The patient is a 72 y.o. yrs old wm here for history and physical for skin excision for two SCC. One on the left neck and one on the right leg.  He will be admitted and undergo dialysis with change over to the heparin.  We will do the surgery the following day and he will be discharged after dialysis 1-2 days later.  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  He states the lesions had been present for ~ 9 months and getting tender, larger and ulcerating.  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 Prescott Gum.  He has thyroid disease and DM.  He is on coumadin.  He has a history of both melanoma and skin cancer according to the records.  The area on his left neck is ~ 3 x 4 cm and ulcerated in the center with raised edges.  The area on the right leg is 2 cm with mild bleedin  Past Medical History:  Diagnosis Date  . AICD (automatic cardioverter/defibrillator) present   . Asthma   . AVM (arteriovenous malformation) of colon 07/07/2017  . CHF (congestive heart failure) ([place])   . Diabetes ([place])   . ESRF (end stage renal failure) ([place]) 07/07/2017  . Kidney disease   . Permanent atrial fibrillation ([place]) 07/07/2017  . Presence of permanent cardiac pacemaker    AICD  . Sleep apnea     Past Surgical History:  Procedure Laterality Date  . AV FISTULA PLACEMENT Left 06/30/2017   Procedure: CREATION of LEFT ARM Brachiocephalic Fistula;  Surgeon: Conrad [place], MD;  Location: [facility];  Service: Vascular;  Laterality: Left;  . CARDIAC CATHETERIZATION N/A 12/02/2016   Procedure: Right Heart Cath;  Surgeon: Larey Dresser, MD;  Location: [facility];  Service: Cardiovascular;  Laterality: N/A;  . COLONOSCOPY N/A 02/14/2017   Procedure: COLONOSCOPY;  Surgeon: Milus Banister, MD;  Location: [facility];  Service: Endoscopy;  Laterality: N/A;  . DIALYSIS/PERMA CATHETER INSERTION  06/30/2017   Procedure: INSERTION Dialysis Catheter;  Surgeon: Conrad [place], MD;  Location: [facility];  Service: Vascular;;  . IABP INSERTION N/A 12/11/2016   Procedure: IABP Insertion;  Surgeon: Larey Dresser, MD;  Location: [facility];  Service: Cardiovascular;  Laterality: N/A;  . INSERTION OF IMPLANTABLE LEFT VENTRICULAR ASSIST DEVICE N/A 12/13/2016   Procedure: INSERTION OF IMPLANTABLE LEFT VENTRICULAR ASSIST DEVICE;  Surgeon: Ivin Poot, MD;  Location: [facility];  Service: Open Heart Surgery;  Laterality: N/A;  HEARTMATE II  NITRIC OXIDE  . IR FLUORO GUIDE CV LINE LEFT  06/17/2017  . IR US GUIDE VASC ACCESS LEFT  06/17/2017  . RIGHT HEART CATH N/A 12/11/2016   Procedure: Right Heart Cath;  Surgeon: Larey Dresser, MD;  Location: [facility];  Service: Cardiovascular;  Laterality: N/A;  . RIGHT HEART CATH N/A 03/27/2017   Procedure: Right Heart Cath;  Surgeon: Larey Dresser, MD;  Location: [facility];  Service: Cardiovascular;  Laterality: N/A;  . RIGHT HEART CATH N/A 06/13/2017   Procedure: RIGHT HEART CATH;  Surgeon: Jolaine Artist, MD;  Location: [facility];  Service: Cardiovascular;  Laterality: N/A;  . TEE WITHOUT CARDIOVERSION N/A 12/13/2016  Procedure: TRANSESOPHAGEAL ECHOCARDIOGRAM (TEE);  Surgeon: Ivin Poot, MD;  Location: [facility];  Service: Open Heart Surgery;  Laterality: N/A;  . TRICUSPID VALVE REPLACEMENT N/A 12/13/2016   Procedure: TRICUSPID VALVE REPAIR WITH EDWARDS MC 3 TRICUSPID ANNULOPLASTY RING MODEL 4900 SIZE T 28;  Surgeon: Ivin Poot, MD;  Location: [facility];  Service: Open Heart Surgery;  Laterality: N/A;    Family History  Problem Relation Age of Onset  . Heart failure Father   . Heart attack Father   . Healthy Mother   . Diabetes Paternal Grandfather    Social History:  reports that  has never smoked. he has  never used smokeless tobacco. He reports that he does not drink alcohol or use drugs.  Allergies: No Known Allergies   (Not in a hospital admission)  Results for orders placed or performed during the hospital encounter of 11/13/17 (from the past 48 hour(s))  Protime-INR     Status: Abnormal   Collection Time: 11/13/17  8:51 AM  Result Value Ref Range   Prothrombin Time 26.7 (H) 11.4 - 15.2 seconds   INR 2.49    No results found.  Review of Systems  Constitutional: Negative.   HENT: Negative.   Eyes: Negative.   Respiratory: Negative.   Cardiovascular: Negative.   Genitourinary: Negative.   Musculoskeletal: Negative.   Skin: Negative.   Neurological: Negative.   Psychiatric/Behavioral: Negative.     There were no vitals taken for this visit. Physical Exam  Constitutional: He is oriented to person, place, and time. He appears well-developed and well-nourished.  HENT:  Head: Normocephalic and atraumatic.  Eyes: EOM are normal. Pupils are equal, round, and reactive to light.  Cardiovascular: Normal rate.  Respiratory: Effort normal.  GI: Soft.  Neurological: He is alert and oriented to person, place, and time.  Skin: Skin is warm.  Psychiatric: He has a normal mood and affect. His behavior is normal. Judgment and thought content normal.     Assessment/Plan  Plan for excision of left neck and right leg skin cancer. The risks that can be encountered with and after excision of a skin lesion were discussed and include the following but not limited to these: bleeding, infection, delayed healing, anesthesia risks, skin sensation changes, injury to structures including nerves, blood vessels, and muscles which may be temporary or permanent, allergies to tape, suture materials and glues, blood products, topical preparations or injected agents, skin contour irregularities, skin discoloration and swelling, deep vein thrombosis, cardiac and pulmonary complications, pain, which may  persist, persistent pain, recurrence of the lesion, poor healing of the incision, possible need for revisional surgery or staged procedures.   [place], DO 11/14/2017, 2:03 PM

## 2017-11-18 ENCOUNTER — Other Ambulatory Visit: Payer: Self-pay

## 2017-11-18 ENCOUNTER — Encounter (HOSPITAL_COMMUNITY): Payer: Self-pay | Admitting: *Deleted

## 2017-11-18 ENCOUNTER — Inpatient Hospital Stay (HOSPITAL_COMMUNITY)
Admission: RE | Admit: 2017-11-18 | Discharge: 2017-12-03 | DRG: 576 | Disposition: A | Discharge status: Home Health | Payer: Medicare Other | Source: Ambulatory Visit | Attending: Cardiology | Admitting: Cardiology

## 2017-11-18 ENCOUNTER — Encounter (HOSPITAL_COMMUNITY): Payer: Medicare Other

## 2017-11-18 DIAGNOSIS — J9382 Other air leak: Secondary | ICD-10-CM | POA: Diagnosis not present

## 2017-11-18 DIAGNOSIS — L03115 Cellulitis of right lower limb: Secondary | ICD-10-CM | POA: Diagnosis not present

## 2017-11-18 DIAGNOSIS — Z79899 Other long term (current) drug therapy: Secondary | ICD-10-CM | POA: Diagnosis not present

## 2017-11-18 DIAGNOSIS — Z951 Presence of aortocoronary bypass graft: Secondary | ICD-10-CM | POA: Diagnosis not present

## 2017-11-18 DIAGNOSIS — E039 Hypothyroidism, unspecified: Secondary | ICD-10-CM | POA: Diagnosis present

## 2017-11-18 DIAGNOSIS — N186 End stage renal disease: Secondary | ICD-10-CM

## 2017-11-18 DIAGNOSIS — C44722 Squamous cell carcinoma of skin of right lower limb, including hip: Secondary | ICD-10-CM | POA: Diagnosis present

## 2017-11-18 DIAGNOSIS — I252 Old myocardial infarction: Secondary | ICD-10-CM

## 2017-11-18 DIAGNOSIS — I480 Paroxysmal atrial fibrillation: Secondary | ICD-10-CM | POA: Diagnosis present

## 2017-11-18 DIAGNOSIS — Z992 Dependence on renal dialysis: Secondary | ICD-10-CM

## 2017-11-18 DIAGNOSIS — Z7901 Long term (current) use of anticoagulants: Secondary | ICD-10-CM | POA: Diagnosis not present

## 2017-11-18 DIAGNOSIS — I132 Hypertensive heart and chronic kidney disease with heart failure and with stage 5 chronic kidney disease, or end stage renal disease: Secondary | ICD-10-CM | POA: Diagnosis present

## 2017-11-18 DIAGNOSIS — Z95810 Presence of automatic (implantable) cardiac defibrillator: Secondary | ICD-10-CM | POA: Diagnosis not present

## 2017-11-18 DIAGNOSIS — R042 Hemoptysis: Secondary | ICD-10-CM | POA: Diagnosis not present

## 2017-11-18 DIAGNOSIS — Z85828 Personal history of other malignant neoplasm of skin: Secondary | ICD-10-CM | POA: Diagnosis not present

## 2017-11-18 DIAGNOSIS — I482 Chronic atrial fibrillation: Secondary | ICD-10-CM | POA: Diagnosis present

## 2017-11-18 DIAGNOSIS — Z794 Long term (current) use of insulin: Secondary | ICD-10-CM

## 2017-11-18 DIAGNOSIS — Z8249 Family history of ischemic heart disease and other diseases of the circulatory system: Secondary | ICD-10-CM

## 2017-11-18 DIAGNOSIS — M79604 Pain in right leg: Secondary | ICD-10-CM | POA: Diagnosis not present

## 2017-11-18 DIAGNOSIS — Z79890 Hormone replacement therapy (postmenopausal): Secondary | ICD-10-CM

## 2017-11-18 DIAGNOSIS — Z952 Presence of prosthetic heart valve: Secondary | ICD-10-CM | POA: Diagnosis not present

## 2017-11-18 DIAGNOSIS — I959 Hypotension, unspecified: Secondary | ICD-10-CM | POA: Diagnosis not present

## 2017-11-18 DIAGNOSIS — D631 Anemia in chronic kidney disease: Secondary | ICD-10-CM | POA: Diagnosis present

## 2017-11-18 DIAGNOSIS — Z95811 Presence of heart assist device: Secondary | ICD-10-CM | POA: Diagnosis not present

## 2017-11-18 DIAGNOSIS — R58 Hemorrhage, not elsewhere classified: Secondary | ICD-10-CM | POA: Diagnosis not present

## 2017-11-18 DIAGNOSIS — G4733 Obstructive sleep apnea (adult) (pediatric): Secondary | ICD-10-CM | POA: Diagnosis present

## 2017-11-18 DIAGNOSIS — Z6831 Body mass index (BMI) 31.0-31.9, adult: Secondary | ICD-10-CM

## 2017-11-18 DIAGNOSIS — I2510 Atherosclerotic heart disease of native coronary artery without angina pectoris: Secondary | ICD-10-CM | POA: Diagnosis present

## 2017-11-18 DIAGNOSIS — D62 Acute posthemorrhagic anemia: Secondary | ICD-10-CM | POA: Diagnosis not present

## 2017-11-18 DIAGNOSIS — C4442 Squamous cell carcinoma of skin of scalp and neck: Secondary | ICD-10-CM | POA: Diagnosis present

## 2017-11-18 DIAGNOSIS — Z833 Family history of diabetes mellitus: Secondary | ICD-10-CM

## 2017-11-18 DIAGNOSIS — L409 Psoriasis, unspecified: Secondary | ICD-10-CM | POA: Diagnosis not present

## 2017-11-18 DIAGNOSIS — R001 Bradycardia, unspecified: Secondary | ICD-10-CM | POA: Diagnosis not present

## 2017-11-18 DIAGNOSIS — K219 Gastro-esophageal reflux disease without esophagitis: Secondary | ICD-10-CM | POA: Diagnosis present

## 2017-11-18 DIAGNOSIS — J449 Chronic obstructive pulmonary disease, unspecified: Secondary | ICD-10-CM | POA: Diagnosis present

## 2017-11-18 DIAGNOSIS — N2581 Secondary hyperparathyroidism of renal origin: Secondary | ICD-10-CM | POA: Diagnosis present

## 2017-11-18 DIAGNOSIS — E669 Obesity, unspecified: Secondary | ICD-10-CM | POA: Diagnosis present

## 2017-11-18 DIAGNOSIS — C4490 Unspecified malignant neoplasm of skin, unspecified: Secondary | ICD-10-CM

## 2017-11-18 DIAGNOSIS — I255 Ischemic cardiomyopathy: Secondary | ICD-10-CM | POA: Diagnosis present

## 2017-11-18 DIAGNOSIS — I5022 Chronic systolic (congestive) heart failure: Secondary | ICD-10-CM | POA: Diagnosis not present

## 2017-11-18 DIAGNOSIS — E1122 Type 2 diabetes mellitus with diabetic chronic kidney disease: Secondary | ICD-10-CM | POA: Diagnosis present

## 2017-11-18 DIAGNOSIS — I5042 Chronic combined systolic (congestive) and diastolic (congestive) heart failure: Secondary | ICD-10-CM | POA: Diagnosis present

## 2017-11-18 DIAGNOSIS — L0390 Cellulitis, unspecified: Secondary | ICD-10-CM

## 2017-11-18 DIAGNOSIS — D696 Thrombocytopenia, unspecified: Secondary | ICD-10-CM | POA: Diagnosis not present

## 2017-11-18 LAB — PROTIME-INR
INR: 2.12
INR: 1.9
Prothrombin Time: 23.5 seconds — ABNORMAL HIGH (ref 11.4–15.2)
Prothrombin Time: 21.7 seconds — ABNORMAL HIGH (ref 11.4–15.2)

## 2017-11-18 LAB — CBC WITH DIFFERENTIAL/PLATELET
Basophils Absolute: 0 10*3/uL (ref 0.0–0.1)
Basophils Relative: 0 %
EOS ABS: 0.2 10*3/uL (ref 0.0–0.7)
EOS PCT: 2 %
HCT: 38 % — ABNORMAL LOW (ref 39.0–52.0)
Hemoglobin: 12.2 g/dL — ABNORMAL LOW (ref 13.0–17.0)
LYMPHS PCT: 15 %
Lymphs Abs: 1.5 10*3/uL (ref 0.7–4.0)
MCH: 30.4 pg (ref 26.0–34.0)
MCHC: 32.1 g/dL (ref 30.0–36.0)
MCV: 94.8 fL (ref 78.0–100.0)
MONOS PCT: 7 %
Monocytes Absolute: 0.8 10*3/uL (ref 0.1–1.0)
Neutro Abs: 7.6 10*3/uL (ref 1.7–7.7)
Neutrophils Relative %: 76 %
PLATELETS: 110 10*3/uL — AB (ref 150–400)
RBC: 4.01 MIL/uL — AB (ref 4.22–5.81)
RDW: 19 % — ABNORMAL HIGH (ref 11.5–15.5)
WBC: 10.1 10*3/uL (ref 4.0–10.5)

## 2017-11-18 LAB — COMPREHENSIVE METABOLIC PANEL
ALBUMIN: 3.8 g/dL (ref 3.5–5.0)
ALT: 20 U/L (ref 17–63)
AST: 31 U/L (ref 15–41)
Alkaline Phosphatase: 81 U/L (ref 38–126)
Anion gap: 14 (ref 5–15)
BUN: 35 mg/dL — AB (ref 6–20)
CO2: 25 mmol/L (ref 22–32)
CREATININE: 5.51 mg/dL — AB (ref 0.61–1.24)
Calcium: 8.7 mg/dL — ABNORMAL LOW (ref 8.9–10.3)
Chloride: 92 mmol/L — ABNORMAL LOW (ref 101–111)
GFR calc Af Amer: 11 mL/min — ABNORMAL LOW (ref >60)
GFR, EST NON AFRICAN AMERICAN: 9 mL/min — AB (ref >60)
GLUCOSE: 241 mg/dL — AB (ref 65–99)
POTASSIUM: 4.5 mmol/L (ref 3.5–5.1)
SODIUM: 131 mmol/L — AB (ref 135–145)
Total Bilirubin: 1 mg/dL (ref 0.3–1.2)
Total Protein: 7.5 g/dL (ref 6.5–8.1)

## 2017-11-18 LAB — LACTATE DEHYDROGENASE: LDH: 264 U/L — AB (ref 98–192)

## 2017-11-18 LAB — GLUCOSE, CAPILLARY
GLUCOSE-CAPILLARY: 112 mg/dL — AB (ref 65–99)
Glucose-Capillary: 122 mg/dL — ABNORMAL HIGH (ref 65–99)

## 2017-11-18 LAB — MRSA PCR SCREENING: MRSA by PCR: NEGATIVE

## 2017-11-18 MED ORDER — PANTOPRAZOLE SODIUM 40 MG PO TBEC
40.0000 mg | DELAYED_RELEASE_TABLET | Freq: Every day | ORAL | Status: DC
  Administered 2017-11-19 – 2017-12-03 (×13): 40 mg via ORAL
  Filled 2017-11-18 (×13): qty 1

## 2017-11-18 MED ORDER — RENA-VITE PO TABS
1.0000 | ORAL_TABLET | Freq: Every day | ORAL | Status: DC
  Administered 2017-11-18 – 2017-12-02 (×15): 1 via ORAL
  Filled 2017-11-18 (×16): qty 1

## 2017-11-18 MED ORDER — LEVOTHYROXINE SODIUM 25 MCG PO TABS
25.0000 ug | ORAL_TABLET | Freq: Every day | ORAL | Status: DC
  Administered 2017-11-19 – 2017-12-03 (×14): 25 ug via ORAL
  Filled 2017-11-18 (×14): qty 1

## 2017-11-18 MED ORDER — TRAMADOL HCL 50 MG PO TABS
50.0000 mg | ORAL_TABLET | Freq: Four times a day (QID) | ORAL | Status: DC | PRN
  Administered 2017-11-20 – 2017-11-30 (×15): 100 mg via ORAL
  Administered 2017-12-01: 50 mg via ORAL
  Administered 2017-12-02 – 2017-12-03 (×2): 100 mg via ORAL
  Filled 2017-11-18 (×17): qty 2
  Filled 2017-11-18: qty 1

## 2017-11-18 MED ORDER — DARBEPOETIN ALFA 100 MCG/0.5ML IJ SOSY
100.0000 ug | PREFILLED_SYRINGE | INTRAMUSCULAR | Status: DC
  Filled 2017-11-18 (×2): qty 0.5

## 2017-11-18 MED ORDER — BUDESONIDE 0.5 MG/2ML IN SUSP
0.5000 mg | Freq: Two times a day (BID) | RESPIRATORY_TRACT | Status: DC
  Administered 2017-11-18 – 2017-12-03 (×28): 0.5 mg via RESPIRATORY_TRACT
  Filled 2017-11-18 (×29): qty 2

## 2017-11-18 MED ORDER — SILDENAFIL CITRATE 20 MG PO TABS
40.0000 mg | ORAL_TABLET | Freq: Three times a day (TID) | ORAL | Status: DC
  Administered 2017-11-18 – 2017-12-03 (×44): 40 mg via ORAL
  Filled 2017-11-18 (×44): qty 2

## 2017-11-18 MED ORDER — INSULIN ASPART 100 UNIT/ML ~~LOC~~ SOLN
0.0000 [IU] | Freq: Three times a day (TID) | SUBCUTANEOUS | Status: DC
  Administered 2017-11-18: 5 [IU] via SUBCUTANEOUS
  Administered 2017-11-18: 2 [IU] via SUBCUTANEOUS
  Administered 2017-11-19: 8 [IU] via SUBCUTANEOUS
  Administered 2017-11-19 – 2017-11-20 (×2): 3 [IU] via SUBCUTANEOUS
  Administered 2017-11-20: 15 [IU] via SUBCUTANEOUS
  Administered 2017-11-21: 3 [IU] via SUBCUTANEOUS
  Administered 2017-11-21: 5 [IU] via SUBCUTANEOUS
  Administered 2017-11-22: 2 [IU] via SUBCUTANEOUS
  Administered 2017-11-22 – 2017-11-25 (×5): 3 [IU] via SUBCUTANEOUS
  Administered 2017-11-26: 2 [IU] via SUBCUTANEOUS
  Administered 2017-11-27: 5 [IU] via SUBCUTANEOUS
  Administered 2017-11-27: 3 [IU] via SUBCUTANEOUS
  Administered 2017-11-28: 2 [IU] via SUBCUTANEOUS
  Administered 2017-11-28 – 2017-11-29 (×2): 3 [IU] via SUBCUTANEOUS
  Administered 2017-11-29: 2 [IU] via SUBCUTANEOUS
  Administered 2017-11-30: 3 [IU] via SUBCUTANEOUS
  Administered 2017-11-30: 2 [IU] via SUBCUTANEOUS
  Administered 2017-11-30 – 2017-12-01 (×2): 3 [IU] via SUBCUTANEOUS
  Administered 2017-12-02 (×2): 2 [IU] via SUBCUTANEOUS
  Administered 2017-12-03: 3 [IU] via SUBCUTANEOUS
  Administered 2017-12-03: 2 [IU] via SUBCUTANEOUS

## 2017-11-18 MED ORDER — ALBUTEROL SULFATE (2.5 MG/3ML) 0.083% IN NEBU: 2.5000 mg | INHALATION_SOLUTION | RESPIRATORY_TRACT | Status: DC | PRN

## 2017-11-18 MED ORDER — BUSPIRONE HCL 5 MG PO TABS
5.0000 mg | ORAL_TABLET | Freq: Two times a day (BID) | ORAL | Status: DC
  Administered 2017-11-18 – 2017-12-03 (×29): 5 mg via ORAL
  Filled 2017-11-18 (×33): qty 1

## 2017-11-18 MED ORDER — INSULIN ASPART 100 UNIT/ML ~~LOC~~ SOLN
0.0000 [IU] | Freq: Every day | SUBCUTANEOUS | Status: DC
  Administered 2017-11-24: 3 [IU] via SUBCUTANEOUS
  Administered 2017-11-28: 2 [IU] via SUBCUTANEOUS

## 2017-11-18 MED ORDER — TRAZODONE HCL 50 MG PO TABS
100.0000 mg | ORAL_TABLET | Freq: Every evening | ORAL | Status: DC | PRN
  Administered 2017-11-18 – 2017-12-02 (×15): 100 mg via ORAL
  Filled 2017-11-18 (×15): qty 2

## 2017-11-18 MED ORDER — BUDESONIDE 0.5 MG/2ML IN SUSP
0.5000 mg | Freq: Two times a day (BID) | RESPIRATORY_TRACT | Status: DC
  Filled 2017-11-18: qty 2

## 2017-11-18 MED ORDER — GUAIFENESIN ER 600 MG PO TB12
1200.0000 mg | ORAL_TABLET | Freq: Two times a day (BID) | ORAL | Status: DC | PRN
  Administered 2017-11-26 – 2017-11-27 (×3): 1200 mg via ORAL
  Filled 2017-11-18 (×3): qty 2

## 2017-11-18 MED ORDER — ONDANSETRON HCL 4 MG/2ML IJ SOLN
4.0000 mg | Freq: Four times a day (QID) | INTRAMUSCULAR | Status: DC | PRN
  Administered 2017-11-26: 4 mg via INTRAVENOUS

## 2017-11-18 MED ORDER — OCTREOTIDE ACETATE 20 MG IM KIT
10.0000 mg | PACK | INTRAMUSCULAR | Status: DC
  Filled 2017-11-18: qty 1

## 2017-11-18 MED ORDER — ATORVASTATIN CALCIUM 40 MG PO TABS
40.0000 mg | ORAL_TABLET | Freq: Every day | ORAL | Status: DC
  Administered 2017-11-18 – 2017-12-03 (×16): 40 mg via ORAL
  Filled 2017-11-18 (×16): qty 1

## 2017-11-18 MED ORDER — DOCUSATE SODIUM 100 MG PO CAPS
100.0000 mg | ORAL_CAPSULE | Freq: Every day | ORAL | Status: DC
  Administered 2017-11-19 – 2017-12-03 (×13): 100 mg via ORAL
  Filled 2017-11-18 (×14): qty 1

## 2017-11-18 MED ORDER — HEPARIN (PORCINE) IN NACL 100-0.45 UNIT/ML-% IJ SOLN
1350.0000 [IU]/h | INTRAMUSCULAR | Status: DC
  Administered 2017-11-18: 1400 [IU]/h via INTRAVENOUS
  Administered 2017-11-19: 1500 [IU]/h via INTRAVENOUS
  Filled 2017-11-18 (×4): qty 250

## 2017-11-18 MED ORDER — WARFARIN SODIUM 7.5 MG PO TABS
7.5000 mg | ORAL_TABLET | Freq: Every evening | ORAL | Status: DC
  Filled 2017-11-18: qty 1

## 2017-11-18 MED ORDER — FERRIC CITRATE 1 GM 210 MG(FE) PO TABS
210.0000 mg | ORAL_TABLET | ORAL | Status: DC
  Administered 2017-11-18 – 2017-11-23 (×10): 210 mg via ORAL
  Filled 2017-11-18 (×17): qty 1

## 2017-11-18 MED ORDER — INSULIN ASPART 100 UNIT/ML ~~LOC~~ SOLN
3.0000 [IU] | Freq: Three times a day (TID) | SUBCUTANEOUS | Status: DC
  Administered 2017-11-18 – 2017-12-03 (×31): 3 [IU] via SUBCUTANEOUS

## 2017-11-18 MED ORDER — ACETAMINOPHEN 325 MG PO TABS
650.0000 mg | ORAL_TABLET | ORAL | Status: DC | PRN
  Administered 2017-11-22 – 2017-12-02 (×9): 650 mg via ORAL
  Filled 2017-11-18 (×9): qty 2

## 2017-11-18 MED ORDER — WARFARIN - PHYSICIAN DOSING INPATIENT: Freq: Every day | Status: DC

## 2017-11-18 MED ORDER — FERRIC CITRATE 1 GM 210 MG(FE) PO TABS
420.0000 mg | ORAL_TABLET | Freq: Three times a day (TID) | ORAL | Status: DC
  Administered 2017-11-18 – 2017-12-01 (×34): 420 mg via ORAL
  Filled 2017-11-18 (×45): qty 2

## 2017-11-18 MED ORDER — CALCITRIOL 0.25 MCG PO CAPS
0.5000 ug | ORAL_CAPSULE | ORAL | Status: DC
  Administered 2017-11-19 – 2017-12-03 (×7): 0.5 ug via ORAL
  Filled 2017-11-18 (×5): qty 2

## 2017-11-18 MED ORDER — GABAPENTIN 600 MG PO TABS
300.0000 mg | ORAL_TABLET | Freq: Two times a day (BID) | ORAL | Status: DC
  Administered 2017-11-18 – 2017-12-03 (×29): 300 mg via ORAL
  Filled 2017-11-18 (×29): qty 1

## 2017-11-18 NOTE — Progress Notes (Signed)
Back from 2h without any complaints.

## 2017-11-18 NOTE — Progress Notes (Addendum)
[facility] for IV heparin when INR < 2 Indication: LVAD  No Active Allergies  Patient Measurements: Height: 5\' 5"  (165.1 cm) Weight: 179 lb 14.3 oz (81.6 kg) IBW/kg (Calculated) : 61.5 Heparin Dosing Weight: 78 kg  Vital Signs: Temp: 98.7 F (37.1 C) (01/15 2000) Temp Source: Oral (01/15 2000) Pulse Rate: 71 (01/15 2109)  Labs: Recent Labs    11/18/17 1137 11/18/17 2101  HGB 12.2*  --   HCT 38.0*  --   PLT 110*  --   LABPROT 23.5* 21.7*  INR 2.12 1.90  CREATININE 5.51*  --     Estimated Creatinine Clearance: 11.9 mL/min (A) (by C-G formula based on SCr of 5.51 mg/dL (H)).  Assessment: 72 yo male with LVAD, admitted for IV heparin bridge before dermatologic surgery 1/17.  PTA Coumadin dose 7.5 mg daily, last dose taken 1/13 per patient report. Platelet count low, but close to baseline.  INR this evening has fallen to 1.9- to start IV heparin now that <2.  Patient previously therapeutic on heparin at rates of 1300-1450 units/hr.  Goal of Therapy:  Heparin level 0.3-0.7 units/ml Monitor platelets by anticoagulation protocol: Yes   Plan:  Start heparin at 1400 units/hr- NO BOLUS First level in 8 hours after starting heparin Daily heparin level and CBC Follow for plans to restart warfarin post-operatively  Adrianna Dudas D. Shantea Poulton, PharmD, BCPS Clinical Pharmacist [phone] 11/18/2017 10:06 PM

## 2017-11-18 NOTE — Consult Note (Signed)
[facility] Renal Consultation Note    Indication for Consultation:  Management of ESRD/hemodialysis; anemia, hypertension/volume and secondary hyperparathyroidism  TGG:YIRSW, Arvid Right, MD  HPI: Trevor [name] is a 72 y.o. male. 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  Past medical history significant for CAD s/p CABGx4 (2010), OSA, AS s/p TAVR (2015), DM2, paroxysmal A. Fib, asthma, and ischemic cardiomyopathy (EF 20-25%) with Medtronic ICD s/p Heartmate II LVAD 12/2016, h/o GIB d/t AVMs s/p APC + clipping (02/2017), and h/o thrombocytopenia.  Mr. Yanes has been admitted for Heparin bridge on coumadin due to a scheduled excision and biopsy of skin cancer on neck and R shin scheduled for 11/20/17.  Seen and examined at bedside, states he is feeling well overall.  Tolerating dialysis well.  Denies SOB, CP, dizziness, weakness, and n/v/d/c.  Of note patient has been compliant with his scheduled treatment regimen.  He has been getting a little under his EDW recently.  Some difficultly with cannulation recently, including a infiltration last HD resulting in having to use his TDC.  Last HD was 1/14, full treatment completed without complication.On 4X/wk HD, recurrent xs fluid intake despite extensive counseling.  Past Medical History:  Diagnosis Date  . AICD (automatic cardioverter/defibrillator) present   . Asthma   . AVM (arteriovenous malformation) of colon 07/07/2017  . CHF (congestive heart failure) ([place])   . Diabetes ([place])   . ESRF (end stage renal failure) ([place]) 07/07/2017  . Kidney disease   . Permanent atrial fibrillation ([place]) 07/07/2017  . Presence of permanent cardiac pacemaker    AICD  . Sleep apnea    Past Surgical History:  Procedure Laterality Date  . AV FISTULA PLACEMENT Left 06/30/2017   Procedure: CREATION of LEFT ARM Brachiocephalic Fistula;  Surgeon: Conrad Buffalo, MD;  Location: [place];  Service: Vascular;  Laterality:  Left;  . CARDIAC CATHETERIZATION N/A 12/02/2016   Procedure: Right Heart Cath;  Surgeon: Larey Dresser, MD;  Location: [place] [facility];  Service: Cardiovascular;  Laterality: N/A;  . COLONOSCOPY N/A 02/14/2017   Procedure: COLONOSCOPY;  Surgeon: Milus Banister, MD;  Location: [place];  Service: Endoscopy;  Laterality: N/A;  . DIALYSIS/PERMA CATHETER INSERTION  06/30/2017   Procedure: INSERTION Dialysis Catheter;  Surgeon: Conrad Washoe, MD;  Location: [place];  Service: Vascular;;  . IABP INSERTION N/A 12/11/2016   Procedure: IABP Insertion;  Surgeon: Larey Dresser, MD;  Location: [place] [facility];  Service: Cardiovascular;  Laterality: N/A;  . INSERTION OF IMPLANTABLE LEFT VENTRICULAR ASSIST DEVICE N/A 12/13/2016   Procedure: INSERTION OF IMPLANTABLE LEFT VENTRICULAR ASSIST DEVICE;  Surgeon: Ivin Poot, MD;  Location: [place];  Service: Open Heart Surgery;  Laterality: N/A;  HEARTMATE II  NITRIC OXIDE  . IR FLUORO GUIDE CV LINE LEFT  06/17/2017  . IR US GUIDE VASC ACCESS LEFT  06/17/2017  . RIGHT HEART CATH N/A 12/11/2016   Procedure: Right Heart Cath;  Surgeon: Larey Dresser, MD;  Location: [place] [facility];  Service: Cardiovascular;  Laterality: N/A;  . RIGHT HEART CATH N/A 03/27/2017   Procedure: Right Heart Cath;  Surgeon: Larey Dresser, MD;  Location: [place] [facility];  Service: Cardiovascular;  Laterality: N/A;  . RIGHT HEART CATH N/A 06/13/2017   Procedure: RIGHT HEART CATH;  Surgeon: Jolaine Artist, MD;  Location: [place] [facility];  Service: Cardiovascular;  Laterality: N/A;  . TEE WITHOUT CARDIOVERSION  N/A 12/13/2016   Procedure: TRANSESOPHAGEAL ECHOCARDIOGRAM (TEE);  Surgeon: Ivin Poot, MD;  Location: [place];  Service: Open Heart Surgery;  Laterality: N/A;  . TRICUSPID VALVE REPLACEMENT N/A 12/13/2016   Procedure: TRICUSPID VALVE REPAIR WITH EDWARDS MC 3 TRICUSPID ANNULOPLASTY RING MODEL 4900 SIZE T 28;  Surgeon: Ivin Poot, MD;  Location: [place];  Service:  Open Heart Surgery;  Laterality: N/A;   Family History  Problem Relation Age of Onset  . Heart failure Father   . Heart attack Father   . Healthy Mother   . Diabetes Paternal Grandfather    Social History:  reports that  has never smoked. he has never used smokeless tobacco. He reports that he does not drink alcohol or use drugs. No Active Allergies Prior to Admission medications   Medication Sig Start Date End Date Taking? Authorizing Provider  albuterol (PROVENTIL HFA;VENTOLIN HFA) 108 (90 Base) MCG/ACT inhaler Inhale 2 puffs into the lungs every 4 (four) hours as needed for wheezing or shortness of breath. 11/27/16  Yes Byrum, Rose Fillers, MD  albuterol (PROVENTIL) (2.5 MG/3ML) 0.083% nebulizer solution Take 3 mLs (2.5 mg total) by nebulization every 4 (four) hours. And as needed Patient taking differently: Take 2.5 mg by nebulization every 4 (four) hours as needed for wheezing or shortness of breath.  11/27/16  Yes Byrum, Rose Fillers, MD  atorvastatin (LIPITOR) 40 MG tablet TAKE 1 TABLET(40 MG) BY MOUTH DAILY Patient taking differently: Take 40 mg by mouth daily at 6 PM. TAKE 1 TABLET(40 MG) BY MOUTH DAILY 09/02/17  Yes Larey Dresser, MD  busPIRone (BUSPAR) 5 MG tablet Take 1 tablet (5 mg total) by mouth 2 (two) times daily. 01/01/17  Yes Clegg, Amy D, NP  calcitRIOL (ROCALTROL) 0.5 MCG capsule Take 1 capsule (0.5 mcg total) by mouth every other day. Patient taking differently: Take 0.5 mcg by mouth every Monday, Wednesday, and Friday with hemodialysis.  07/08/17  Yes Bensimhon, Shaune Pascal, MD  docusate sodium (COLACE) 100 MG capsule Take 1 capsule (100 mg total) by mouth 2 (two) times daily. Patient taking differently: Take 100 mg daily by mouth.  04/03/17  Yes Larey Dresser, MD  fluticasone (FLOVENT HFA) 110 MCG/ACT inhaler Inhale 2 puffs into the lungs 2 (two) times daily. 11/27/16  Yes [name], MD  gabapentin (NEURONTIN) 600 MG tablet Take 0.5 tablets (300 mg total) by mouth 2 (two)  times daily. 07/23/17  Yes Larey Dresser, MD  insulin glargine (LANTUS) 100 unit/mL SOPN Inject 0.2 mLs (20 Units total) into the skin at bedtime. Patient taking differently: Inject 25 Units into the skin at bedtime.  01/10/17  Yes Larey Dresser, MD  levothyroxine (SYNTHROID, LEVOTHROID) 25 MCG tablet Take 1 tablet (25 mcg total) by mouth daily before breakfast. 10/02/17  Yes Larey Dresser, MD  multivitamin (RENA-VIT) TABS tablet Take 1 tablet by mouth at bedtime. 09/02/17  Yes Larey Dresser, MD  pantoprazole (PROTONIX) 40 MG tablet Take 1 tablet (40 mg total) by mouth daily. 08/11/17  Yes Clegg, Amy D, NP  sildenafil (REVATIO) 20 MG tablet Take 2 tablets (40 mg total) 3 (three) times daily by mouth. 09/11/17  Yes Larey Dresser, MD  traMADol (ULTRAM) 50 MG tablet TAKE 1 TABLET BY MOUTH EVERY 6 HOURS AS NEEDED FOR MODERATE PAIN 08/14/17  Yes Bensimhon, Shaune Pascal, MD  traZODone (DESYREL) 100 MG tablet Take 1 tablet (100 mg total) by mouth at bedtime as needed for sleep. 09/30/17  Yes Larey Dresser, MD  warfarin (COUMADIN) 5 MG tablet Take 7.5 mg by mouth every evening.    Yes [provider]  doxycycline (VIBRA-TABS) 100 MG tablet Take 1 tablet (100 mg total) by mouth 2 (two) times daily. Patient not taking: Reported on 11/13/2017 11/06/17   Darrick Grinder D, NP  ferric citrate (AURYXIA) 1 GM 210 MG(Fe) tablet Take 420 mg by mouth 3 (three) times daily with meals.    [provider]  guaiFENesin (MUCINEX) 600 MG 12 hr tablet Take 1,200 mg 2 (two) times daily as needed by mouth for cough.    [provider]   Current Facility-Administered Medications  Medication Dose Route Frequency Provider Last Rate Last Dose  . acetaminophen (TYLENOL) tablet 650 mg  650 mg Oral Q4H PRN Shirley Friar, PA-C      . albuterol (PROVENTIL) (2.5 MG/3ML) 0.083% nebulizer solution 2.5 mg  2.5 mg Inhalation Q4H PRN Shirley Friar, PA-C      . atorvastatin (LIPITOR) tablet  40 mg  40 mg Oral q1800 Shirley Friar, PA-C      . budesonide (PULMICORT) nebulizer solution 0.5 mg  0.5 mg Inhalation BID Shirley Friar, PA-C      . busPIRone (BUSPAR) tablet 5 mg  5 mg Oral BID Shirley Friar, PA-C      . [START ON 11/19/2017] calcitRIOL (ROCALTROL) capsule 0.5 mcg  0.5 mcg Oral Q M,W,F-HD Shirley Friar, PA-C      . docusate sodium (COLACE) capsule 100 mg  100 mg Oral Daily Shirley Friar, PA-C      . gabapentin (NEURONTIN) tablet 300 mg  300 mg Oral BID Shirley Friar, PA-C      . guaiFENesin [facility]) 12 hr tablet 1,200 mg  1,200 mg Oral BID PRN Shirley Friar, PA-C      . insulin aspart (novoLOG) injection 0-15 Units  0-15 Units Subcutaneous TID WC Shirley Friar, PA-C   5 Units at 11/18/17 1327  . insulin aspart (novoLOG) injection 0-5 Units  0-5 Units Subcutaneous QHS Shirley Friar, PA-C      . insulin aspart (novoLOG) injection 3 Units  3 Units Subcutaneous TID WC Shirley Friar, PA-C   3 Units at 11/18/17 1328  . [START ON 11/19/2017] levothyroxine (SYNTHROID, LEVOTHROID) tablet 25 mcg  25 mcg Oral QAC breakfast Shirley Friar, PA-C      . multivitamin (RENA-VIT) tablet 1 tablet  1 tablet Oral QHS Shirley Friar, PA-C      . octreotide (SANDOSTATIN LAR) IM injection 10 mg  10 mg Intramuscular Q30 days Shirley Friar, PA-C      . ondansetron [facility]) injection 4 mg  4 mg Intravenous Q6H PRN Shirley Friar, PA-C      . pantoprazole (PROTONIX) EC tablet 40 mg  40 mg Oral Daily Shirley Friar, PA-C      . sildenafil (REVATIO) tablet 40 mg  40 mg Oral TID Shirley Friar, PA-C      . traMADol [facility]at[facility] Bourbon) tablet 50-100 mg  50-100 mg Oral Q6H PRN Shirley Friar, PA-C      . traZODone (DESYREL) tablet 100 mg  100 mg Oral QHS PRN Shirley Friar, PA-C      . warfarin (COUMADIN) tablet 7.5 mg  7.5 mg Oral QPM Shirley Friar, PA-C      . Warfarin - Physician Dosing Inpatient   Does not apply q1800 Larey Dresser,  MD       Labs: Basic Metabolic Panel: Recent Labs  Lab 11/18/17 1137  NA 131*  K 4.5  CL 92*  CO2 25  GLUCOSE 241*  BUN 35*  CREATININE 5.51*  CALCIUM 8.7*   Liver Function Tests: Recent Labs  Lab 11/18/17 1137  AST 31  ALT 20  ALKPHOS 81  BILITOT 1.0  PROT 7.5  ALBUMIN 3.8   CBC: Recent Labs  Lab 11/18/17 1137  WBC 10.1  NEUTROABS 7.6  HGB 12.2*  HCT 38.0*  MCV 94.8  PLT 110*   ROS: All others negative except those listed in HPI.  Physical Exam: Vitals:   11/18/17 1308  Temp: (!) 97.3 F (36.3 C)  TempSrc: Oral     General: WDWN, NAD, well appearing male Head: NCAT, sclera not icteric, MMM DM retinal changes Neck: Supple. PC lymphadenopathy Lungs: CTAB, No wheeze, rales or rhonchi. Breathing is unlabored. Heart: mechanical heart sounds, +hum of LVAD.   Abdomen: soft, nontender, +BS, no guarding, no rebound tenderness LVAD tubes RUQ, liver down 6 cm M/S:  Equal strength b/l in upper and lower extremities.  Lower extremities:no edema, ischemic changes, or open wounds Wound R leg lat Neuro: A&Ox3. Moves all extremities spontaneously. Psych:  Responds to questions appropriately with a normal affect. Dialysis Access: LU AVF +brusing around upper arm, +b/t, TDC in R upper chest  Dialysis Orders:  MWFS - [facility]  4.5 hrs, BFR 400, DFR 800,  EDW 82kg, 2K/ 2Ca,   Access: LU AVF, TDC  Heparin 5800 Unit bolus Mircera 200 mcg IV q2wks, last on 11/05/17 venofer 50mg  IV qwk, last 11/12/17 Calcitriol 0.71mcg PO qHD TID  Last Labs: 11/12/17 Hgb 12.2, TSAT 23%, K 4.4, Ca 8.9, P 6.7, PTH 245, Albumin 4.2  Assessment/Plan: 1.  SCC L neck and RLE- excision by [name] on 1/17, heparin to coumadin bridge 2.  ESRD -  MWFS schedule, will write orders for HD tomorrow in dialysis unit. Will need LVAD trained employee accompany him to treatment. K 4.5. 3.   Hypertension/volume  - No weights today, BP stable. Does not appear volume overloaded on exam.  Titrate down volume as tolerated with max goal 3L. Cont to counsel 4.  Anemia  - Hgb 12.2, No need for ESA at this time. Follow trend.  5.  Secondary Hyperparathyroidism -  Ca 8.7 today, need P, P 6.7 as outpatient. Continue VRDA and binders. 6.  Nutrition - Alb 3.8. Renal/Carb modified diet w/Fluid restriction. Renavite. 7. Chronic systolic/diastolic CHF: ischemic cardiomyopathy, EF 20-25% w/LVAD, Medtronic ICD. - on coumadin - per cardio 8. CAD s/p CABG - stable, per cardio 9. A fib - on coumadin - per cardio 10. H/o GIB - per primary 11. H/o TAVR - stable, per primary  Jen Mow, PA-C [facility] Pager: [phone] 11/18/2017, 3:34 PM I have seen and examined this patient and agree with the plan of care seen, eval , counseled, examined.  Discussed with extender. Jeneen Rinks Phuong Moffatt 11/18/2017, 4:27 PM

## 2017-11-18 NOTE — Progress Notes (Signed)
ANTICOAGULATION CONSULT NOTE - Initial Consult  Pharmacy Consult for IV heparin when INR < 2 Indication: LVAD  No Known Allergies  Patient Measurements:   Heparin Dosing Weight: 78 kg  Vital Signs: Temp: 97.3 F (36.3 C) (01/15 1308) Temp Source: Oral (01/15 1308)  Labs: Recent Labs    11/18/17 1137  HGB 12.2*  HCT 38.0*  PLT 110*  LABPROT 23.5*  INR 2.12  CREATININE 5.51*    Estimated Creatinine Clearance: 11.9 mL/min (A) (by C-G formula based on SCr of 5.51 mg/dL (H)).   Medical History: Past Medical History:  Diagnosis Date  . AICD (automatic cardioverter/defibrillator) present   . Asthma   . AVM (arteriovenous malformation) of colon 07/07/2017  . CHF (congestive heart failure) ([place])   . Diabetes ([place])   . ESRF (end stage renal failure) ([place]) 07/07/2017  . Kidney disease   . Permanent atrial fibrillation ([place]) 07/07/2017  . Presence of permanent cardiac pacemaker    AICD  . Sleep apnea     Medications:  Infusions:    Assessment: 72 yo male with LVAD, admitted for IV heparin bridge before dermatologic surgery 1/17.  PTA Coumadin dose 7.5 mg daily, last dose taken 1/13 per patient report.  Current INR 2.12.  Platelet count low, but close to baseline.  Goal of Therapy:  Heparin level 0.3-0.7 units/ml Monitor platelets by anticoagulation protocol: Yes   Plan:  1. Recheck INR at 9 PM. 2. Start IV heparin when INR < or = 2.  Uvaldo Rising, BCPS  Clinical Pharmacist Pager [phone]  11/18/2017 1:15 PM

## 2017-11-18 NOTE — Progress Notes (Signed)
Went to [facility] with Lvad  Coordinator.

## 2017-11-18 NOTE — H&P (Signed)
Advanced Heart Failure VAD History and Physical Note   Reason for Admission: Scheduled inpatient procedure/Heparin bridging in VAD patient.   HPI:    Trevor [name] is a 72 y.o. male  history of CAD s/p CABG x 4 2010, OSA, AS with TAVR 2015, ESRD, DM2, paroxysmal atrial fibrillation, asthma, and ischemic cardiomyopathy with Medtronic ICD. 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 Post op period complicated by ARF leading to ESRD and need for chronic dialysis.   Last seen in HF clinic 09/16/2017. Has been doing well overall. Tolerated HD, though initially had difficulty taking off enough fluid.   Pt has been followed by [name] for Skin cancer on neck and R shin. Plan for excision and biopsy 11/20/17, so scheduled for admission 11/18/17 with heparin bridge on coumadin.   Pt presents today for scheduled admit as above. Feeling good overall. Denies DOE. Tolerating dialysis. Working on getting a HD center closer to home. Denies lightheadedness or dizziness. Anxious to have SCC removed from L neck and R shin. Denies problems with his driveline or GI bleeding.   LVAD INTERROGATION:  HeartMate II LVAD:  Flow 5.4 liters/min, speed 9600, power 6.0, PI 3.3. No PI events.   Personally reviewed   Review of systems complete and found to be negative unless listed in HPI.   Home Medications Prior to Admission medications   Medication Sig Start Date End Date Taking? Authorizing Provider  albuterol (PROVENTIL HFA;VENTOLIN HFA) 108 (90 Base) MCG/ACT inhaler Inhale 2 puffs into the lungs every 4 (four) hours as needed for wheezing or shortness of breath. 11/27/16  Yes Byrum, Rose Fillers, MD  albuterol (PROVENTIL) (2.5 MG/3ML) 0.083% nebulizer solution Take 3 mLs (2.5 mg total) by nebulization every 4 (four) hours. And as needed Patient taking differently: Take 2.5 mg by nebulization every 4 (four) hours as needed for wheezing or shortness of breath.  11/27/16  Yes Byrum, Rose Fillers, MD  atorvastatin (LIPITOR) 40 MG  tablet TAKE 1 TABLET(40 MG) BY MOUTH DAILY Patient taking differently: Take 40 mg by mouth daily at 6 PM. TAKE 1 TABLET(40 MG) BY MOUTH DAILY 09/02/17  Yes Larey Dresser, MD  busPIRone (BUSPAR) 5 MG tablet Take 1 tablet (5 mg total) by mouth 2 (two) times daily. 01/01/17  Yes Clegg, Amy D, NP  calcitRIOL (ROCALTROL) 0.5 MCG capsule Take 1 capsule (0.5 mcg total) by mouth every other day. Patient taking differently: Take 0.5 mcg by mouth every Monday, Wednesday, and Friday with hemodialysis.  07/08/17  Yes Carolos Fecher, Shaune Pascal, MD  docusate sodium (COLACE) 100 MG capsule Take 1 capsule (100 mg total) by mouth 2 (two) times daily. Patient taking differently: Take 100 mg daily by mouth.  04/03/17  Yes Larey Dresser, MD  fluticasone (FLOVENT HFA) 110 MCG/ACT inhaler Inhale 2 puffs into the lungs 2 (two) times daily. 11/27/16  Yes [name], MD  gabapentin (NEURONTIN) 600 MG tablet Take 0.5 tablets (300 mg total) by mouth 2 (two) times daily. 07/23/17  Yes Larey Dresser, MD  insulin glargine (LANTUS) 100 unit/mL SOPN Inject 0.2 mLs (20 Units total) into the skin at bedtime. Patient taking differently: Inject 25 Units into the skin at bedtime.  01/10/17  Yes Larey Dresser, MD  levothyroxine (SYNTHROID, LEVOTHROID) 25 MCG tablet Take 1 tablet (25 mcg total) by mouth daily before breakfast. 10/02/17  Yes Larey Dresser, MD  multivitamin (RENA-VIT) TABS tablet Take 1 tablet by mouth at bedtime. 09/02/17  Yes Larey Dresser, MD  pantoprazole (PROTONIX) 40 MG tablet Take 1 tablet (40 mg total) by mouth daily. 08/11/17  Yes Clegg, Amy D, NP  sildenafil (REVATIO) 20 MG tablet Take 2 tablets (40 mg total) 3 (three) times daily by mouth. 09/11/17  Yes Larey Dresser, MD  traMADol (ULTRAM) 50 MG tablet TAKE 1 TABLET BY MOUTH EVERY 6 HOURS AS NEEDED FOR MODERATE PAIN 08/14/17  Yes Cahlil Sattar, Shaune Pascal, MD  traZODone (DESYREL) 100 MG tablet Take 1 tablet (100 mg total) by mouth at bedtime as needed for  sleep. 09/30/17  Yes Larey Dresser, MD  warfarin (COUMADIN) 5 MG tablet Take 7.5 mg by mouth every evening.    Yes [provider]  doxycycline (VIBRA-TABS) 100 MG tablet Take 1 tablet (100 mg total) by mouth 2 (two) times daily. Patient not taking: Reported on 11/13/2017 11/06/17   Darrick Grinder D, NP  ferric citrate (AURYXIA) 1 GM 210 MG(Fe) tablet Take 420 mg by mouth 3 (three) times daily with meals.    [provider]  guaiFENesin (MUCINEX) 600 MG 12 hr tablet Take 1,200 mg 2 (two) times daily as needed by mouth for cough.    [provider]    Past Medical History: Past Medical History:  Diagnosis Date  . AICD (automatic cardioverter/defibrillator) present   . Asthma   . AVM (arteriovenous malformation) of colon 07/07/2017  . CHF (congestive heart failure) ([place])   . Diabetes ([place])   . ESRF (end stage renal failure) ([place]) 07/07/2017  . Kidney disease   . Permanent atrial fibrillation ([place]) 07/07/2017  . Presence of permanent cardiac pacemaker    AICD  . Sleep apnea     Past Surgical History: Past Surgical History:  Procedure Laterality Date  . AV FISTULA PLACEMENT Left 06/30/2017   Procedure: CREATION of LEFT ARM Brachiocephalic Fistula;  Surgeon: Conrad L'Anse, MD;  Location: [facility];  Service: Vascular;  Laterality: Left;  . CARDIAC CATHETERIZATION N/A 12/02/2016   Procedure: Right Heart Cath;  Surgeon: Larey Dresser, MD;  Location: [facility];  Service: Cardiovascular;  Laterality: N/A;  . COLONOSCOPY N/A 02/14/2017   Procedure: COLONOSCOPY;  Surgeon: Milus Banister, MD;  Location: [facility];  Service: Endoscopy;  Laterality: N/A;  . DIALYSIS/PERMA CATHETER INSERTION  06/30/2017   Procedure: INSERTION Dialysis Catheter;  Surgeon: Conrad Marvin, MD;  Location: [facility];  Service: Vascular;;  . IABP INSERTION N/A 12/11/2016   Procedure: IABP Insertion;  Surgeon: Larey Dresser, MD;  Location: [facility];  Service: Cardiovascular;  Laterality:  N/A;  . INSERTION OF IMPLANTABLE LEFT VENTRICULAR ASSIST DEVICE N/A 12/13/2016   Procedure: INSERTION OF IMPLANTABLE LEFT VENTRICULAR ASSIST DEVICE;  Surgeon: Ivin Poot, MD;  Location: [facility];  Service: Open Heart Surgery;  Laterality: N/A;  HEARTMATE II  NITRIC OXIDE  . IR FLUORO GUIDE CV LINE LEFT  06/17/2017  . IR US GUIDE VASC ACCESS LEFT  06/17/2017  . RIGHT HEART CATH N/A 12/11/2016   Procedure: Right Heart Cath;  Surgeon: Larey Dresser, MD;  Location: [facility];  Service: Cardiovascular;  Laterality: N/A;  . RIGHT HEART CATH N/A 03/27/2017   Procedure: Right Heart Cath;  Surgeon: Larey Dresser, MD;  Location: [facility];  Service: Cardiovascular;  Laterality: N/A;  . RIGHT HEART CATH N/A 06/13/2017   Procedure: RIGHT HEART CATH;  Surgeon: Jolaine Artist, MD;  Location: [facility];  Service: Cardiovascular;  Laterality: N/A;  .  TEE WITHOUT CARDIOVERSION N/A 12/13/2016   Procedure: TRANSESOPHAGEAL ECHOCARDIOGRAM (TEE);  Surgeon: Ivin Poot, MD;  Location: [facility];  Service: Open Heart Surgery;  Laterality: N/A;  . TRICUSPID VALVE REPLACEMENT N/A 12/13/2016   Procedure: TRICUSPID VALVE REPAIR WITH EDWARDS MC 3 TRICUSPID ANNULOPLASTY RING MODEL 4900 SIZE T 28;  Surgeon: Ivin Poot, MD;  Location: [facility];  Service: Open Heart Surgery;  Laterality: N/A;    Family History: Family History  Problem Relation Age of Onset  . Heart failure Father   . Heart attack Father   . Healthy Mother   . Diabetes Paternal Grandfather     Social History: Social History   Socioeconomic History  . Marital status: Widowed    Spouse name: Not on file  . Number of children: 0  . Years of education: 76  . Highest education level: Not on file  Social Needs  . Financial resource strain: Not on file  . Food insecurity - worry: Not on file  . Food insecurity - inability: Not on file  . Transportation needs - medical: Not on file  . Transportation needs - non-medical:  Not on file  Occupational History  . Not on file  Tobacco Use  . Smoking status: Never Smoker  . Smokeless tobacco: Never Used  Substance and Sexual Activity  . Alcohol use: No  . Drug use: No  . Sexual activity: Not Currently  Other Topics Concern  . Not on file  Social History Narrative   Patient is a widow. Moved to [place] from [place], [place]. Currently lives with his sister Diago Haik.    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     Allergies:  No Known Allergies  Objective:    Vital Signs:   Temp:  [97.3 F (36.3 C)] 97.3 F (36.3 C) (01/15 1308) Last BM Date: 11/17/17  Mean arterial Pressure 70  Physical Exam    General:  Well appearing. No resp difficulty HEENT: Normal Neck: supple. JVP not elevated. Carotids 2+ bilat; no bruits. No lymphadenopathy or thyromegaly appreciated. L neck with bandage.  Cor: Mechanical heart sounds with LVAD hum present. Lungs: Clear Abdomen: soft, nontender, nondistended. No hepatosplenomegaly. No bruits or masses. Good bowel sounds. Driveline: C/D/I; securement device intact and driveline incorporated Extremities: no cyanosis, clubbing, rash, or edema. R shin with bandage. Neuro: alert & orientedx3, cranial nerves grossly intact. moves all 4 extremities w/o difficulty. Affect pleasant  Telemetry   Not yet connected.  EKG   Not yet collected.  Labs    Basic Metabolic Panel: Recent Labs  Lab 11/18/17 1137  NA 131*  K 4.5  CL 92*  CO2 25  GLUCOSE 241*  BUN 35*  CREATININE 5.51*  CALCIUM 8.7*    Liver Function Tests: Recent Labs  Lab 11/18/17 1137  AST 31  ALT 20  ALKPHOS 81  BILITOT 1.0  PROT 7.5  ALBUMIN 3.8   No results for input(s): LIPASE, AMYLASE in the last 168 hours. No results for input(s): AMMONIA in the last 168 hours.  CBC: Recent Labs  Lab 11/18/17 1137  WBC 10.1  NEUTROABS 7.6  HGB 12.2*  HCT 38.0*  MCV 94.8  PLT 110*    Cardiac Enzymes: No results for  input(s): CKTOTAL, CKMB, CKMBINDEX, TROPONINI in the last 168 hours.  BNP: BNP (last 3 results) Recent Labs    11/28/16 1104 12/14/16 0348  BNP 1,825.3* 427.7*    ProBNP (last 3 results) Recent Labs  11/27/16 1207  PROBNP 1,746.0*     CBG: No results for input(s): GLUCAP in the last 168 hours.  Coagulation Studies: Recent Labs    11/18/17 1137  LABPROT 23.5*  INR 2.12   Imaging     No results found.  Patient Profile:   Trevor [name] is a 72 y.o. male  history of CAD s/p CABG x 4 2010, OSA, AS with TAVR 2015, ESRD, DM2, paroxysmal atrial fibrillation, asthma, and ischemic cardiomyopathy with Medtronic ICD. 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 Post op period complicated by ARF leading to ESRD and need for chronic dialysis.   Admitted 11/18/17 for heparin bridging for Excision of SCC x 2.   Assessment/Plan:    1. SCC L neck and RLE - Plan for excision by [name] 11/20/17 - INR 2.1 today. Start heparin once INR < 2.0 2. Chronic systolic CHF: Ischemic cardiomyopathy, EF 20-25% with RV dysfunction on 2/18 echo pre-LVAD.  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  Medtronic ICD.   - Volume status looks stable on exam with HD.  - Continue sildenafil 40 TID for RV failure - Med titration limited by soft MAPs and ESRD. - Last dose of coumadin 11/17/17. INR 2.1 today. Start heparin once INR < 2.0.  3. CAD: s/p CABG.  - No s/s of ischemia.    4. ESRD:  - Tolerating HD. Renal aware pt is in house.  5. Atrial fibrillation: Chronic. - On coumadin. Bridging with heparin as above.  6. RV failure: - Continue Revatio as above.  7. HTN:  - MAPs stable.  8. Anemia: Baseline anemia of renal disease/chronic disease but recent bleeding from colonic AVMs.  - Hgb stable today.   9. 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  - Continue octreotide as outpatient.  10. H/o TAVR:  - Stable on most recent echo.   Stable from HF/VAD perspective. Follow INR. Start  heparin once <2.0  I reviewed the LVAD parameters from today, and compared the results to the patient's prior recorded data.  No programming changes were made.  The LVAD is functioning within specified parameters.  The patient performs LVAD self-test daily.  LVAD interrogation was negative for any significant power changes, alarms or PI events/speed drops.  LVAD equipment check completed and is in good working order.  Back-up equipment present.   LVAD education done on emergency procedures and precautions and reviewed exit site care.  Length of Stay: 0  Annamaria Helling 11/18/2017, 1:14 PM VAD Team Pager [phone] (7am - 7am) +++VAD ISSUES ONLY+++ Advanced Heart Failure Team Pager [phone] (M-F; [place])  Please contact [facility] for night-coverage after hours (4p -7a ) and weekends on [email or web address] for all non- LVAD Issues  Patient seen and examined with the above-signed Advanced Practice Provider and/or Housestaff. I personally reviewed laboratory data, imaging studies and relevant notes. I independently examined the patient and formulated the important aspects of the plan. I have edited the note to reflect any of my changes or salient points. I have personally discussed the plan with the patient and/or family.  He is stable from a HF perspective. Admitted for bridging anticoagulation for skin cancer resection is setting of VAD. Volume status currently ok. VAD interrogated personally. Parameters stable. INR 2.12. Discussed with PharmD. Will start heparin when INR < 2.0. Have arranged for inpatient HD. Per LVAD protocol will have HD performed on 2C with VAD-trained nurses.   Glori Bickers, MD  3:46 PM

## 2017-11-18 NOTE — Progress Notes (Signed)
Direct admission from home by wheelchair.

## 2017-11-19 ENCOUNTER — Ambulatory Visit: Payer: Self-pay | Admitting: Plastic Surgery

## 2017-11-19 DIAGNOSIS — C4490 Unspecified malignant neoplasm of skin, unspecified: Secondary | ICD-10-CM

## 2017-11-19 LAB — RENAL FUNCTION PANEL
Albumin: 3.3 g/dL — ABNORMAL LOW (ref 3.5–5.0)
Anion gap: 14 (ref 5–15)
BUN: 47 mg/dL — ABNORMAL HIGH (ref 6–20)
CHLORIDE: 93 mmol/L — AB (ref 101–111)
CO2: 24 mmol/L (ref 22–32)
CREATININE: 6.3 mg/dL — AB (ref 0.61–1.24)
Calcium: 8.5 mg/dL — ABNORMAL LOW (ref 8.9–10.3)
GFR calc Af Amer: 9 mL/min — ABNORMAL LOW (ref >60)
GFR calc non Af Amer: 8 mL/min — ABNORMAL LOW (ref >60)
GLUCOSE: 187 mg/dL — AB (ref 65–99)
POTASSIUM: 4.6 mmol/L (ref 3.5–5.1)
Phosphorus: 6.3 mg/dL — ABNORMAL HIGH (ref 2.5–4.6)
Sodium: 131 mmol/L — ABNORMAL LOW (ref 135–145)

## 2017-11-19 LAB — LACTATE DEHYDROGENASE: LDH: 239 U/L — AB (ref 98–192)

## 2017-11-19 LAB — GLUCOSE, CAPILLARY
GLUCOSE-CAPILLARY: 273 mg/dL — AB (ref 65–99)
Glucose-Capillary: 198 mg/dL — ABNORMAL HIGH (ref 65–99)
Glucose-Capillary: 163 mg/dL — ABNORMAL HIGH (ref 65–99)

## 2017-11-19 LAB — CBC
HCT: 34.6 % — ABNORMAL LOW (ref 39.0–52.0)
Hemoglobin: 11.4 g/dL — ABNORMAL LOW (ref 13.0–17.0)
MCH: 30.7 pg (ref 26.0–34.0)
MCHC: 32.9 g/dL (ref 30.0–36.0)
MCV: 93.3 fL (ref 78.0–100.0)
PLATELETS: 102 10*3/uL — AB (ref 150–400)
RBC: 3.71 MIL/uL — ABNORMAL LOW (ref 4.22–5.81)
RDW: 18.3 % — AB (ref 11.5–15.5)
WBC: 10.4 10*3/uL (ref 4.0–10.5)

## 2017-11-19 LAB — HEPARIN LEVEL (UNFRACTIONATED)
Heparin Unfractionated: 0.28 IU/mL — ABNORMAL LOW (ref 0.30–0.70)
Heparin Unfractionated: 0.7 IU/mL (ref 0.30–0.70)

## 2017-11-19 LAB — PROTIME-INR
INR: 1.89
Prothrombin Time: 21.5 seconds — ABNORMAL HIGH (ref 11.4–15.2)

## 2017-11-19 MED ORDER — CHLORHEXIDINE GLUCONATE CLOTH 2 % EX PADS
6.0000 | MEDICATED_PAD | Freq: Once | CUTANEOUS | Status: AC
  Administered 2017-11-19: 6 via TOPICAL

## 2017-11-19 MED ORDER — CHLORHEXIDINE GLUCONATE CLOTH 2 % EX PADS
6.0000 | MEDICATED_PAD | Freq: Once | CUTANEOUS | Status: AC
  Administered 2017-11-20: 6 via TOPICAL

## 2017-11-19 MED ORDER — HEPARIN SODIUM (PORCINE) 1000 UNIT/ML DIALYSIS: 1000.0000 [IU] | INTRAMUSCULAR | Status: DC | PRN

## 2017-11-19 MED ORDER — CALCITRIOL 0.5 MCG PO CAPS
ORAL_CAPSULE | ORAL | Status: AC
  Filled 2017-11-19: qty 1

## 2017-11-19 MED ORDER — SODIUM CHLORIDE 0.9 % IV SOLN: 100.0000 mL | INTRAVENOUS | Status: DC | PRN

## 2017-11-19 MED ORDER — CEFAZOLIN SODIUM-DEXTROSE 2-4 GM/100ML-% IV SOLN: 2.0000 g | INTRAVENOUS | Status: DC

## 2017-11-19 MED ORDER — PENTAFLUOROPROP-TETRAFLUOROETH EX AERO: 1.0000 "application " | INHALATION_SPRAY | CUTANEOUS | Status: DC | PRN

## 2017-11-19 MED ORDER — CHLORHEXIDINE GLUCONATE CLOTH 2 % EX PADS
6.0000 | MEDICATED_PAD | Freq: Once | CUTANEOUS | Status: AC
  Administered 2017-11-26: 6 via TOPICAL

## 2017-11-19 MED ORDER — HEPARIN SODIUM (PORCINE) 1000 UNIT/ML DIALYSIS
100.0000 [IU]/kg | INTRAMUSCULAR | Status: DC | PRN
  Filled 2017-11-19: qty 9

## 2017-11-19 MED ORDER — LIDOCAINE-PRILOCAINE 2.5-2.5 % EX CREA: 1.0000 "application " | TOPICAL_CREAM | CUTANEOUS | Status: DC | PRN

## 2017-11-19 MED ORDER — ALTEPLASE 2 MG IJ SOLR: 2.0000 mg | Freq: Once | INTRAMUSCULAR | Status: DC | PRN

## 2017-11-19 MED ORDER — LIDOCAINE HCL (PF) 1 % IJ SOLN: 5.0000 mL | INTRAMUSCULAR | Status: DC | PRN

## 2017-11-19 MED ORDER — CEFAZOLIN SODIUM-DEXTROSE 2-4 GM/100ML-% IV SOLN
2.0000 g | INTRAVENOUS | Status: DC
  Filled 2017-11-19: qty 100

## 2017-11-19 NOTE — Progress Notes (Signed)
Subjective: Interval History: has no complaint   Objective: Vital signs in last 24 hours: Temp:  [97.3 F (36.3 C)-98.7 F (37.1 C)] 97.7 F (36.5 C) (01/16 0345) Pulse Rate:  [71-93] 71 (01/15 2109) Resp:  [17] 17 (01/15 2109) SpO2:  [93 %-99 %] 99 % (01/15 2109) Weight:  [81.6 kg (179 lb 14.3 oz)] 81.6 kg (179 lb 14.3 oz) (01/15 1138) Weight change:   Intake/Output from previous day: 01/15 0701 - 01/16 0700 In: 360 [P.O.:360] Out: -  Intake/Output this shift: No intake/output data recorded.  General appearance: alert, cooperative, no distress and mildly obese Resp: diminished breath sounds bilaterally Chest wall: IJ PC Cardio: cont hum of LVAD GI: soft, LVAD tubes RUQ, liver down 6 cm Extremities: AVF with hematoma LUA,    Lab Results: Recent Labs    11/18/17 1137 11/19/17 0227  WBC 10.1 10.4  HGB 12.2* 11.4*  HCT 38.0* 34.6*  PLT 110* 102*   BMET:  Recent Labs    11/18/17 1137 11/19/17 0227  NA 131* 131*  K 4.5 4.6  CL 92* 93*  CO2 25 24  GLUCOSE 241* 187*  BUN 35* 47*  CREATININE 5.51* 6.30*  CALCIUM 8.7* 8.5*   No results for input(s): PTH in the last 72 hours. Iron Studies: No results for input(s): IRON, TIBC, TRANSFERRIN, FERRITIN in the last 72 hours.  Studies/Results: No results found.  I have reviewed the patient's current medications.  Assessment/Plan: 1 ESRD for hd, will try 1 needle in AVF 2 Anemia stable 3 HPTH  4 CM with LVAD converting to Hep 5 Skin Ca 6 DM controlled P HD, HEp , use AVF    LOS: 1 day   Trevor [name] 11/19/2017,7:44 AM

## 2017-11-19 NOTE — Progress Notes (Signed)
Patient ID: Trevor [name], male   DOB: 1946-01-20, 72 y.o.   MRN: [id number]   Advanced Heart Failure VAD Team Note  Subjective:    No complaints this morning.  INR 1.89, heparin gtt started.   LVAD INTERROGATION:  HeartMate II LVAD:  Flow 5.6 liters/min, speed 9600, power 6.2, PI 4.8.  10-15 PI events/24 hrs.   Objective:    Vital Signs:   Temp:  [97.3 F (36.3 C)-98.7 F (37.1 C)] 97.7 F (36.5 C) (01/16 0345) Pulse Rate:  [71-93] 71 (01/15 2109) Resp:  [17] 17 (01/15 2109) SpO2:  [93 %-99 %] 99 % (01/15 2109) Weight:  [179 lb 14.3 oz (81.6 kg)] 179 lb 14.3 oz (81.6 kg) (01/15 1138) Last BM Date: 11/17/17 Mean arterial Pressure 80s  Intake/Output:   Intake/Output Summary (Last 24 hours) at 11/19/2017 0813 Last data filed at 11/18/2017 1800 Gross per 24 hour  Intake 360 ml  Output -  Net 360 ml     Physical Exam    General:  Well appearing. No resp difficulty HEENT: normal Neck: supple. JVP 8. Carotids 2+ bilat; no bruits. No lymphadenopathy or thyromegaly appreciated. Cor: Mechanical heart sounds with LVAD hum present. Lungs: clear Abdomen: soft, nontender, nondistended. No hepatosplenomegaly. No bruits or masses. Good bowel sounds. Driveline: C/D/I; securement device intact and driveline incorporated Extremities: no cyanosis, clubbing, rash, edema Neuro: alert & orientedx3, cranial nerves grossly intact. moves all 4 extremities w/o difficulty. Affect pleasant   Telemetry   Atrial fibrillation rate 90s, personally reviewed.   Labs   Basic Metabolic Panel: Recent Labs  Lab 11/18/17 1137 11/19/17 0227  NA 131* 131*  K 4.5 4.6  CL 92* 93*  CO2 25 24  GLUCOSE 241* 187*  BUN 35* 47*  CREATININE 5.51* 6.30*  CALCIUM 8.7* 8.5*  PHOS  --  6.3*    Liver Function Tests: Recent Labs  Lab 11/18/17 1137 11/19/17 0227  AST 31  --   ALT 20  --   ALKPHOS 81  --   BILITOT 1.0  --   PROT 7.5  --   ALBUMIN 3.8 3.3*   No results for input(s): LIPASE,  AMYLASE in the last 168 hours. No results for input(s): AMMONIA in the last 168 hours.  CBC: Recent Labs  Lab 11/18/17 1137 11/19/17 0227  WBC 10.1 10.4  NEUTROABS 7.6  --   HGB 12.2* 11.4*  HCT 38.0* 34.6*  MCV 94.8 93.3  PLT 110* 102*    INR: Recent Labs  Lab 11/13/17 0851 11/18/17 1137 11/18/17 2101 11/19/17 0227  INR 2.49 2.12 1.90 1.89    Other results:  EKG:    Imaging    No results found.   Medications:     Scheduled Medications: . atorvastatin  40 mg Oral q1800  . budesonide  0.5 mg Inhalation BID  . busPIRone  5 mg Oral BID  . calcitRIOL  0.5 mcg Oral Q M,W,F-HD  . darbepoetin (ARANESP) injection - DIALYSIS  100 mcg Intravenous Q Wed-HD  . docusate sodium  100 mg Oral Daily  . ferric citrate  210 mg Oral With snacks  . ferric citrate  420 mg Oral TID WC  . gabapentin  300 mg Oral BID  . insulin aspart  0-15 Units Subcutaneous TID WC  . insulin aspart  0-5 Units Subcutaneous QHS  . insulin aspart  3 Units Subcutaneous TID WC  . levothyroxine  25 mcg Oral QAC breakfast  . multivitamin  1 tablet Oral QHS  .  octreotide  10 mg Intramuscular Q30 days  . pantoprazole  40 mg Oral Daily  . sildenafil  40 mg Oral TID     Infusions: . heparin 1,500 Units/hr (11/19/17 0654)     PRN Medications:  acetaminophen, albuterol, guaiFENesin, ondansetron (ZOFRAN) IV, traMADol, traZODone   Patient Profile   Trevor [name] is a 72 y.o. male  history of CAD s/p CABG x 4 2010, OSA, AS with TAVR 2015, ESRD, DM2, paroxysmal atrial fibrillation, asthma, and ischemic cardiomyopathy with Medtronic ICD. 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 Post op period complicated by ARF leading to ESRD and need for chronic dialysis.   Admitted 11/18/17 for heparin bridging for Excision of SCC x 2.   Assessment/Plan:    1. 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 11/20/17 - INR 1.89, on heparin gtt.  2. Chronic systolic CHF: Ischemic  cardiomyopathy, EF 20-25% with RV dysfunction on 2/18 echo pre-LVAD. 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 Medtronic ICD. Volume status looks stable on exam with HD. MAP stable today.  - Continue sildenafil 40 TID for RV failure - Heparin gtt for INR < 2.  3. CAD: s/p CABG: No s/s of ischemia.    4. ESRD: Will need HD today.  Will need LVAD-trained staff monitoring.  5. Atrial fibrillation: Chronic. - On coumadin. Bridging with heparin as above.  6. RV failure: Continue Revatio as above.  7. EVO:JJKK stable.  8. Anemia: Baseline anemia of renal disease/chronic disease but recent bleeding from colonic AVMs.  - Hgb stable today.   9. 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 Off ASA with goal INR 2-2.5.  - Continue octreotide as outpatient.  10. H/o TAVR:  Stable on most recent echo.   I reviewed the LVAD parameters from today, and compared the results to the patient's prior recorded data.  No programming changes were made.  The LVAD is functioning within specified parameters.  The patient performs LVAD self-test daily.  LVAD interrogation was negative for any significant power changes, alarms or PI events/speed drops.  LVAD equipment check completed and is in good working order.  Back-up equipment present.   LVAD education done on emergency procedures and precautions and reviewed exit site care.  Length of Stay: 1  Loralie Champagne, MD 11/19/2017, 8:13 AM  VAD Team --- VAD ISSUES ONLY--- Pager [phone] (7am - 7am)  Advanced Heart Failure Team  Pager [phone] (M-F; 7a - 4p)  Please contact [facility] for night-coverage after hours (4p -7a ) and weekends on [email or web address]

## 2017-11-19 NOTE — Anesthesia Preprocedure Evaluation (Addendum)
Anesthesia Evaluation  Patient identified by MRN, date of birth, ID band Patient awake    Reviewed: NPO status , Patient's Chart, lab work & pertinent test results, Unable to perform ROS - Chart review only  History of Anesthesia Complications Negative for: history of anesthetic complications  Airway Mallampati: II  TM Distance: >3 FB Neck ROM: Full    Dental  (+) Chipped, Missing, Dental Advisory Given   Pulmonary sleep apnea , COPD,  COPD inhaler,    breath sounds clear to auscultation       Cardiovascular + CAD, + CABG and +CHF (LVAD Heartmate II)  + dysrhythmias Atrial Fibrillation + pacemaker + Cardiac Defibrillator + Valvular Problems/Murmurs (s/p TAVR, s/p TVR)  Rhythm:Regular Rate:Bradycardia  LVAD:  Flow 5 liters/min, speed 9600, power 5.9, PI 3.6  '18 ECHO: EF 20-25%, mild MR, s/p TV repair   Neuro/Psych negative neurological ROS     GI/Hepatic Neg liver ROS, GERD  Medicated and Controlled,GI bleed: octreotide   Endo/Other  diabetes (glu 202), Insulin DependentHypothyroidism   Renal/GU ESRF and DialysisRenal disease (dialyzed yesterday, K+ 3.6)     Musculoskeletal   Abdominal   Peds  Hematology Coumadin: INR 1.89   Anesthesia Other Findings   Reproductive/Obstetrics                            Anesthesia Physical Anesthesia Plan  ASA: IV  Anesthesia Plan: General   Post-op Pain Management:    Induction: Intravenous  PONV Risk Score and Plan: 2 and Ondansetron, Dexamethasone and Treatment may vary due to age or medical condition  Airway Management Planned: LMA  Additional Equipment:   Intra-op Plan:   Post-operative Plan:   Informed Consent: I have reviewed the patients History and Physical, chart, labs and discussed the procedure including the risks, benefits and alternatives for the proposed anesthesia with the patient or authorized representative who has indicated  his/her understanding and acceptance.   Dental advisory given  Plan Discussed with: CRNA and Surgeon  Anesthesia Plan Comments: (Plan routine monitors, GA- LMA OK VAD RN present for doppler BP)       Anesthesia Quick Evaluation

## 2017-11-19 NOTE — Progress Notes (Signed)
LVAD Coordinator Rounding Note:  Admitted 11/18/2017 for scheduled inpatient procedure/heparin bridging.  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 Darcey Nora under Destination Therapy criteria.  Vital signs: HR: 71AFib Doppler Pressure:92 Automatic BP: not performed  O2 Sat: 100% on RA Wt in lbs: 179 lbs  LVAD interrogation reveals:  Speed:9600 Flow: 5.6 Power: 6 w PI: 5.4 Alarms: none Events: 10-15 PI events Fixed speed: 9600 Low speed limit: 9000  Drive Line: S[place]aview dressing dry and intact; Anchor intact. Will need weekly dressing change. Next due 11/20/2017  Labs:  INR trend: 1.90>1.89  Gtts: Heparin bridge  Anticoagulation Plan: -INR Goal: 2.0 - 2.5. Warfarin on hold for scheduled procedure 11/20/17  Device: -Medtronic single lead -Therapies: on  Plan/Recommendations:   1. Please page VAD coordinator for equipment issues or patient concerns 2. Dressing due to be changed tomorrow by VAD Coordinator. 3. VAD Coordinator will accompany patient to the OR tomorrow.   Balinda Quails RN, VAD Coordinator 24/7 pager [phone]

## 2017-11-19 NOTE — Progress Notes (Signed)
Trevor [name] for IV heparin Indication: LVAD  No Active Allergies  Patient Measurements: Height: 5\' 5"  (165.1 cm) Weight: 182 lb 12.2 oz (82.9 kg)(standing) IBW/kg (Calculated) : 61.5 Heparin Dosing Weight: 78 kg  Vital Signs: Temp: 97.6 F (36.4 C) (01/16 1141) Temp Source: Oral (01/16 1141) BP: 109/70 (01/16 1430) Pulse Rate: 73 (01/16 1430)  Labs: Recent Labs    11/18/17 1137 11/18/17 2101 11/19/17 0227 11/19/17 0602 11/19/17 1500  HGB 12.2*  --  11.4*  --   --   HCT 38.0*  --  34.6*  --   --   PLT 110*  --  102*  --   --   LABPROT 23.5* 21.7* 21.5*  --   --   INR 2.12 1.90 1.89  --   --   HEPARINUNFRC  --   --   --  0.28* 0.70  CREATININE 5.51*  --  6.30*  --   --     Estimated Creatinine Clearance: 10.5 mL/min (A) (by C-G formula based on SCr of 6.3 mg/dL (H)).  Assessment: 72 yo male with LVAD, admitted for IV heparin bridge before dermatologic surgery 1/17.  PTA Coumadin dose 7.5 mg daily, last dose taken 1/13 per patient report. Platelet count low, but close to baseline.  INR 1.89, heparin level in dialysis is at upper end of goal at 0.7. Given history of GIB and lower INR goal will reduce rate to 1350 units/hr. No bleeding issues noted.  Goal of Therapy:  Heparin level 0.3-0.7 units/ml Monitor platelets by anticoagulation protocol: Yes   Plan:  Decrease heparin to 1350 units/hr Daily heparin level and CBC Follow for plans to restart warfarin post-operatively  Erin Hearing PharmD., BCPS Clinical Pharmacist Phone - [phone] 11/19/2017 3:44 PM

## 2017-11-19 NOTE — Procedures (Signed)
I was present at this session.  I have reviewed the session itself and made appropriate changes.  bp low 100s, LVAD.  1 needle aVF.  LVAD nurse in attendance  Jeneen Rinks Damaris Abeln 1/16/20191:41 PM

## 2017-11-20 ENCOUNTER — Inpatient Hospital Stay (HOSPITAL_COMMUNITY): Payer: Medicare Other | Admitting: Anesthesiology

## 2017-11-20 ENCOUNTER — Encounter (HOSPITAL_COMMUNITY): Payer: Self-pay

## 2017-11-20 ENCOUNTER — Encounter (HOSPITAL_COMMUNITY)
Admission: RE | Disposition: A | Discharge status: Home Health | Payer: Self-pay | Source: Ambulatory Visit | Attending: Cardiology

## 2017-11-20 HISTORY — PX: APPLICATION OF A-CELL OF EXTREMITY: SHX6303

## 2017-11-20 HISTORY — PX: MASS EXCISION: SHX2000

## 2017-11-20 LAB — CBC
HCT: 34.8 % — ABNORMAL LOW (ref 39.0–52.0)
HEMOGLOBIN: 11 g/dL — AB (ref 13.0–17.0)
MCH: 29.6 pg (ref 26.0–34.0)
MCHC: 31.6 g/dL (ref 30.0–36.0)
MCV: 93.5 fL (ref 78.0–100.0)
Platelets: 115 10*3/uL — ABNORMAL LOW (ref 150–400)
RBC: 3.72 MIL/uL — ABNORMAL LOW (ref 4.22–5.81)
RDW: 18.4 % — AB (ref 11.5–15.5)
WBC: 8.8 10*3/uL (ref 4.0–10.5)

## 2017-11-20 LAB — RENAL FUNCTION PANEL
ALBUMIN: 3.3 g/dL — AB (ref 3.5–5.0)
Anion gap: 12 (ref 5–15)
BUN: 25 mg/dL — AB (ref 6–20)
CHLORIDE: 93 mmol/L — AB (ref 101–111)
CO2: 26 mmol/L (ref 22–32)
CREATININE: 4.32 mg/dL — AB (ref 0.61–1.24)
Calcium: 8.3 mg/dL — ABNORMAL LOW (ref 8.9–10.3)
GFR, EST AFRICAN AMERICAN: 14 mL/min — AB (ref >60)
GFR, EST NON AFRICAN AMERICAN: 12 mL/min — AB (ref >60)
Glucose, Bld: 243 mg/dL — ABNORMAL HIGH (ref 65–99)
PHOSPHORUS: 4.6 mg/dL (ref 2.5–4.6)
Potassium: 3.6 mmol/L (ref 3.5–5.1)
Sodium: 131 mmol/L — ABNORMAL LOW (ref 135–145)

## 2017-11-20 LAB — GLUCOSE, CAPILLARY
GLUCOSE-CAPILLARY: 202 mg/dL — AB (ref 65–99)
GLUCOSE-CAPILLARY: 192 mg/dL — AB (ref 65–99)
GLUCOSE-CAPILLARY: 442 mg/dL — AB (ref 65–99)
Glucose-Capillary: 205 mg/dL — ABNORMAL HIGH (ref 65–99)
Glucose-Capillary: 128 mg/dL — ABNORMAL HIGH (ref 65–99)

## 2017-11-20 LAB — LACTATE DEHYDROGENASE: LDH: 240 U/L — AB (ref 98–192)

## 2017-11-20 LAB — PROTIME-INR
INR: 1.42
Prothrombin Time: 17.2 seconds — ABNORMAL HIGH (ref 11.4–15.2)

## 2017-11-20 LAB — HEPARIN LEVEL (UNFRACTIONATED)
HEPARIN UNFRACTIONATED: 0.51 [IU]/mL (ref 0.30–0.70)
Heparin Unfractionated: 0.37 IU/mL (ref 0.30–0.70)

## 2017-11-20 LAB — HEPATITIS B SURFACE ANTIGEN: Hepatitis B Surface Ag: NEGATIVE

## 2017-11-20 SURGERY — EXCISION MASS
Anesthesia: General | Laterality: Left

## 2017-11-20 MED ORDER — LIDOCAINE-EPINEPHRINE 1 %-1:100000 IJ SOLN
INTRAMUSCULAR | Status: AC
  Filled 2017-11-20: qty 1

## 2017-11-20 MED ORDER — PHENYLEPHRINE HCL 10 MG/ML IJ SOLN
INTRAMUSCULAR | Status: DC | PRN
  Administered 2017-11-20: 120 ug via INTRAVENOUS
  Administered 2017-11-20: 40 ug via INTRAVENOUS
  Administered 2017-11-20 (×2): 120 ug via INTRAVENOUS
  Administered 2017-11-20: 160 ug via INTRAVENOUS

## 2017-11-20 MED ORDER — WARFARIN SODIUM 10 MG PO TABS
12.0000 mg | ORAL_TABLET | Freq: Once | ORAL | Status: AC
  Administered 2017-11-20: 18:00:00 12 mg via ORAL
  Filled 2017-11-20: qty 1

## 2017-11-20 MED ORDER — DEXAMETHASONE SODIUM PHOSPHATE 10 MG/ML IJ SOLN
INTRAMUSCULAR | Status: DC | PRN
  Administered 2017-11-20: 10 mg via INTRAVENOUS

## 2017-11-20 MED ORDER — ONDANSETRON HCL 4 MG/2ML IJ SOLN
INTRAMUSCULAR | Status: DC | PRN
  Administered 2017-11-20: 4 mg via INTRAVENOUS

## 2017-11-20 MED ORDER — PROPOFOL 10 MG/ML IV BOLUS
INTRAVENOUS | Status: AC
Start: 2017-11-20 — End: 2017-11-20
  Filled 2017-11-20: qty 20

## 2017-11-20 MED ORDER — PROMETHAZINE HCL 25 MG/ML IJ SOLN: 6.2500 mg | INTRAMUSCULAR | Status: DC | PRN

## 2017-11-20 MED ORDER — LIDOCAINE 2% (20 MG/ML) 5 ML SYRINGE
INTRAMUSCULAR | Status: AC
  Filled 2017-11-20: qty 5

## 2017-11-20 MED ORDER — MIDAZOLAM HCL 2 MG/2ML IJ SOLN
INTRAMUSCULAR | Status: AC
  Filled 2017-11-20: qty 2

## 2017-11-20 MED ORDER — 0.9 % SODIUM CHLORIDE (POUR BTL) OPTIME
TOPICAL | Status: DC | PRN
  Administered 2017-11-20: 1000 mL

## 2017-11-20 MED ORDER — HEPARIN (PORCINE) IN NACL 100-0.45 UNIT/ML-% IJ SOLN
1150.0000 [IU]/h | INTRAMUSCULAR | Status: DC
  Administered 2017-11-20 – 2017-11-21 (×3): 1400 [IU]/h via INTRAVENOUS
  Administered 2017-11-22: 1150 [IU]/h via INTRAVENOUS
  Filled 2017-11-20 (×3): qty 250

## 2017-11-20 MED ORDER — LIDOCAINE-EPINEPHRINE (PF) 1 %-1:200000 IJ SOLN
INTRAMUSCULAR | Status: DC | PRN
  Administered 2017-11-20: 20 mL

## 2017-11-20 MED ORDER — WARFARIN - PHARMACIST DOSING INPATIENT
Freq: Every day | Status: DC
  Administered 2017-11-21 – 2017-12-02 (×4)

## 2017-11-20 MED ORDER — ETOMIDATE 2 MG/ML IV SOLN
INTRAVENOUS | Status: DC | PRN
  Administered 2017-11-20: 20 mg via INTRAVENOUS

## 2017-11-20 MED ORDER — MIDAZOLAM HCL 2 MG/2ML IJ SOLN: 0.5000 mg | Freq: Once | INTRAMUSCULAR | Status: DC | PRN

## 2017-11-20 MED ORDER — SODIUM CHLORIDE 0.9 % IV SOLN
INTRAVENOUS | Status: DC
  Administered 2017-11-20 – 2017-11-27 (×3): via INTRAVENOUS

## 2017-11-20 MED ORDER — PHENYLEPHRINE HCL 10 MG/ML IJ SOLN
INTRAVENOUS | Status: DC | PRN
  Administered 2017-11-20: 40 ug/min via INTRAVENOUS

## 2017-11-20 MED ORDER — FENTANYL CITRATE (PF) 100 MCG/2ML IJ SOLN
INTRAMUSCULAR | Status: DC | PRN
  Administered 2017-11-20: 25 ug via INTRAVENOUS

## 2017-11-20 MED ORDER — FENTANYL CITRATE (PF) 250 MCG/5ML IJ SOLN
INTRAMUSCULAR | Status: AC
  Filled 2017-11-20: qty 5

## 2017-11-20 MED ORDER — FENTANYL CITRATE (PF) 100 MCG/2ML IJ SOLN: 25.0000 ug | INTRAMUSCULAR | Status: DC | PRN

## 2017-11-20 MED ORDER — MEPERIDINE HCL 25 MG/ML IJ SOLN: 6.2500 mg | INTRAMUSCULAR | Status: DC | PRN

## 2017-11-20 SURGICAL SUPPLY — 66 items
BANDAGE ACE 4X5 VEL STRL LF (GAUZE/BANDAGES/DRESSINGS) ×3 IMPLANT
BLADE CLIPPER SURG (BLADE) IMPLANT
BLADE SURG 15 STRL LF DISP TIS (BLADE) ×1 IMPLANT
BLADE SURG 15 STRL SS (BLADE) ×2
BNDG GAUZE ELAST 4 BULKY (GAUZE/BANDAGES/DRESSINGS) ×6 IMPLANT
CANISTER PREVENA PLUS 150 (CANNISTER) ×3 IMPLANT
CANISTER SUCT 3000ML PPV (MISCELLANEOUS) IMPLANT
CANISTER WOUND CARE 500ML ATS (WOUND CARE) ×3 IMPLANT
CANISTER WOUNDNEG PRESSURE 500 (CANNISTER) ×3 IMPLANT
CLOSURE WOUND 1/2 X4 (GAUZE/BANDAGES/DRESSINGS)
DECANTER SPIKE VIAL GLASS SM (MISCELLANEOUS) ×3 IMPLANT
DERMABOND ADVANCED (GAUZE/BANDAGES/DRESSINGS) ×2
DERMABOND ADVANCED .7 DNX12 (GAUZE/BANDAGES/DRESSINGS) ×1 IMPLANT
DRAPE INCISE IOBAN 66X45 STRL (DRAPES) IMPLANT
DRAPE LAPAROTOMY 100X72 PEDS (DRAPES) IMPLANT
DRAPE U-SHAPE 76X120 STRL (DRAPES) IMPLANT
DRESSING PREVENA PLUS CUSTOM (GAUZE/BANDAGES/DRESSINGS) ×1 IMPLANT
DRSG ADAPTIC 3X8 NADH LF (GAUZE/BANDAGES/DRESSINGS) IMPLANT
DRSG CUTIMED SORBACT 7X9 (GAUZE/BANDAGES/DRESSINGS) ×3 IMPLANT
DRSG MEPILEX BORDER 4X4 (GAUZE/BANDAGES/DRESSINGS) ×3 IMPLANT
DRSG PREVENA PLUS CUSTOM (GAUZE/BANDAGES/DRESSINGS) ×3
DRSG TEGADERM 2-3/8X2-3/4 SM (GAUZE/BANDAGES/DRESSINGS) IMPLANT
DRSG VAC ATS LRG SENSATRAC (GAUZE/BANDAGES/DRESSINGS) IMPLANT
DRSG VAC ATS MED SENSATRAC (GAUZE/BANDAGES/DRESSINGS) IMPLANT
DRSG VAC ATS SM SENSATRAC (GAUZE/BANDAGES/DRESSINGS) IMPLANT
ELECT CAUTERY BLADE 6.4 (BLADE) ×3 IMPLANT
ELECT COATED BLADE 2.86 ST (ELECTRODE) IMPLANT
ELECT NEEDLE BLADE 2-5/6 (NEEDLE) IMPLANT
ELECT REM PT RETURN 9FT ADLT (ELECTROSURGICAL) ×3
ELECTRODE REM PT RTRN 9FT ADLT (ELECTROSURGICAL) ×1 IMPLANT
GAUZE SPONGE 4X4 12PLY STRL (GAUZE/BANDAGES/DRESSINGS) ×3 IMPLANT
GAUZE SPONGE 4X4 12PLY STRL LF (GAUZE/BANDAGES/DRESSINGS) ×3 IMPLANT
GEL ULTRASOUND 20GR AQUASONIC (MISCELLANEOUS) IMPLANT
GLOVE BIO SURGEON STRL SZ 6.5 (GLOVE) ×4 IMPLANT
GLOVE BIO SURGEONS STRL SZ 6.5 (GLOVE) ×2
GOWN STRL REUS W/ TWL LRG LVL3 (GOWN DISPOSABLE) ×3 IMPLANT
GOWN STRL REUS W/TWL LRG LVL3 (GOWN DISPOSABLE) ×6
KIT BASIN OR (CUSTOM PROCEDURE TRAY) ×3 IMPLANT
KIT PREVENA INCISION MGT 13 (CANNISTER) ×3 IMPLANT
MATRIX WOUND 3-LAYER 5X5 (Tissue) ×2 IMPLANT
MICROMATRIX 1000MG (Tissue) ×3 IMPLANT
NEEDLE HYPO 25GX1X1/2 BEV (NEEDLE) ×3 IMPLANT
NS IRRIG 1000ML POUR BTL (IV SOLUTION) ×3 IMPLANT
PACK ORTHO EXTREMITY (CUSTOM PROCEDURE TRAY) ×3 IMPLANT
PACK SURGICAL SETUP 50X90 (CUSTOM PROCEDURE TRAY) ×3 IMPLANT
PENCIL BUTTON HOLSTER BLD 10FT (ELECTRODE) ×3 IMPLANT
SOLUTION PARTIC MCRMTRX 1000MG (Tissue) ×1 IMPLANT
STAPLER VISISTAT 35W (STAPLE) IMPLANT
STOCKINETTE IMPERVIOUS 9X36 MD (GAUZE/BANDAGES/DRESSINGS) IMPLANT
STOCKINETTE IMPERVIOUS LG (DRAPES) IMPLANT
STRIP CLOSURE SKIN 1/2X4 (GAUZE/BANDAGES/DRESSINGS) IMPLANT
SUT MNCRL AB 3-0 PS2 18 (SUTURE) ×3 IMPLANT
SUT MON AB 4-0 PC3 18 (SUTURE) ×6 IMPLANT
SUT MON AB 5-0 PS2 18 (SUTURE) ×3 IMPLANT
SUT SILK 4 0 P 3 (SUTURE) ×3 IMPLANT
SUT VIC AB 3-0 SH 27 (SUTURE)
SUT VIC AB 3-0 SH 27X BRD (SUTURE) IMPLANT
SUT VIC AB 5-0 PS2 18 (SUTURE) ×3 IMPLANT
SYR BULB 3OZ (MISCELLANEOUS) ×3 IMPLANT
SYR CONTROL 10ML LL (SYRINGE) ×3 IMPLANT
TOWEL OR 17X24 6PK STRL BLUE (TOWEL DISPOSABLE) ×3 IMPLANT
TOWEL OR 17X26 10 PK STRL BLUE (TOWEL DISPOSABLE) ×3 IMPLANT
TUBE CONNECTING 12'X1/4 (SUCTIONS) ×1
TUBE CONNECTING 12X1/4 (SUCTIONS) ×2 IMPLANT
WOUND MATRIX 3-LAYER 5X5 (Tissue) ×1 IMPLANT
YANKAUER SUCT BULB TIP NO VENT (SUCTIONS) ×3 IMPLANT

## 2017-11-20 NOTE — Progress Notes (Signed)
Paged [name] for further instructions on heparin during surgery

## 2017-11-20 NOTE — Progress Notes (Signed)
VAD Coordinator Procedure Note:   Patient underwent excision of left neck with squamous cell as well as Right lower leg by [name]. Hemodynamics and VAD parameters monitored by me and CRNA throughout the procedure. MAPs were obtained with automatic BP cuff on the right arm.       Auto cuff(MAP):  Flow: PI: Power:     Speed:      Time:          Pre-procedure:105/93(98)     5.3  4.7  6.1      9600               0857  Sedation Induction:116/105(111)     4.3  5.6 5.6      9600     0909               118/92(101)     5.0  3.4 5.9      9600     0915  Started neo gtt  78/35(48)     6.4  2.1 6.7      9600     0930      98/86(91)     5.7  3.3 6.3      9600     0945     107/92(99)     5.1  4.5 6.0      9600     1000  Interventions:  Pt was given 500 cc bolus intraop and started on a neo gtt for low PI and low BP.  Recovery area:115/94(102)     5.1  4.5 6.0      9600      1025     95/78(85)     5.0  4.3 6.0      9600      1030     95/85(90)     5.3  4.2 6.1      9600      1045   Patient tolerated the procedure well. PIs were 2.1-5.6 throughout the case with no power elevations. After adequate sedation was achieved, pulse ox 98% and maintained >92% throughout the remainder of the procedure. MAPs were 48-111.   Patient Disposition: pt was escorted with PACU nurses back to 2C14 and handoff was given to VAD trained nurse Marita Kansas.  [name], BSN VAD Coordinator 24/7 Pager [phone]

## 2017-11-20 NOTE — Progress Notes (Signed)
Existing VAD dressing removed and site care performed using sterile technique. Drive line exit site cleaned with Chlora prep applicators x 2, allowed to dry, and Sorbaview dressing with bio patch re-applied, and covered with Tegaderm dressings x 2. Exit site healed and incorporated, the velour is fully implanted at exit site. No redness, tenderness, drainage, foul odor or rash noted. Drive line anchor re-applied. Pt denies fever or chills.   [name], VAD Coordinator 24/7 pager [phone]

## 2017-11-20 NOTE — Progress Notes (Addendum)
LVAD Coordinator Rounding Note:  Admitted 11/18/2017 for scheduled inpatient procedure/heparin bridging.  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 Darcey Nora under Destination Therapy criteria.  Vital signs: HR:71AFib Doppler Pressure:92 Automatic BP:85/56 O2 Sat: 99% on RA Wt in lbs: 179>177 lbs  LVAD interrogation reveals:  Speed:9600 Flow:5.1 Power: 6.2w PI:3.3 Alarms: none Events:20 PI events Fixed speed: 9600 Low speed limit: 9000  Drive Line: Sorbaview dressing dry and intact; Anchor intact. Will need weekly dressing change. Next due 11/20/2017  Labs:  INR trend: 1.90>1.89>1.42  Gtts: Heparin 1450 u/hr will place this on hold in holding room and can be held for 2 hours post surgery.   Anticoagulation Plan: -INR Goal: 2.0 - 2.5. Warfarin on hold  Device: -Medtronic single lead -Therapies: on  Plan/Recommendations:   1. Please page VAD coordinator for equipment issues or patient concerns 2. Dressing due to be changed today, VAD coordinator will change today. 3. VAD Coordinator will accompany patient to the OR today.   [name], VAD Coordinator 24/7 pager [phone]

## 2017-11-20 NOTE — Plan of Care (Signed)
Pt is ambulating in the hallway. Pt frequently changes positions in bed and ambulates to the bathroom unassisted. Will continue to monitor.

## 2017-11-20 NOTE — Transfer of Care (Signed)
Immediate Anesthesia Transfer of Care Note  Patient: Trevor [name]  Procedure(s) Performed: EXCISION OF LEFT NECK AND RIGHT LEG SKIN CANCER WITH A CELL PLACEMENT (Left ) APPLICATION OF A-CELL (Left )  Patient Location: PACU  Anesthesia Type:General  Level of Consciousness: awake, alert  and oriented  Airway & Oxygen Therapy: Patient Spontanous Breathing and Patient connected to nasal cannula oxygen  Post-op Assessment: Report given to RN, Post -op Vital signs reviewed and stable and Patient moving all extremities  Post vital signs: Reviewed and stable  Last Vitals:  Vitals:   11/20/17 0413 11/20/17 0717  BP: (!) 85/56   Pulse: 89   Resp: 19   Temp: 36.7 C   SpO2: 99% 97%    Last Pain:  Vitals:   11/20/17 0413  TempSrc: Oral         Complications: No apparent anesthesia complications LVAD coordinator present at bedside.

## 2017-11-20 NOTE — Progress Notes (Signed)
Inpatient Diabetes Program Recommendations  AACE/ADA: New Consensus Statement on Inpatient Glycemic Control (2015)  Target Ranges:  Prepandial:   less than 140 mg/dL      Peak postprandial:   less than 180 mg/dL (1-2 hours)      Critically ill patients:  140 - 180 mg/dL   Lab Results  Component Value Date   GLUCAP 192 (H) 11/20/2017   HGBA1C 7.5 09/03/2017    Review of Glycemic ControlResults for NICOLAAS, SAVO (MRN [id number]) as of 11/20/2017 14:04  Ref. Range 11/19/2017 17:51 11/19/2017 21:10 11/20/2017 08:21 11/20/2017 10:17 11/20/2017 12:17  Glucose-Capillary Latest Ref Range: 65 - 99 mg/dL 273 (H) 163 (H) 202 (H) 205 (H) 192 (H)   Diabetes history: Type 2 DM Outpatient Diabetes medications: Lantus 25 units q HS  Current orders for Inpatient glycemic control:  Novolog moderate tid with meals and HS, Novolog 3 units tid with meals  Inpatient Diabetes Program Recommendations:    Please consider restarting Lantus 12 units q HS (this is 1/2 of home dose).   Thanks,  Adah Perl, RN, BC-ADM Inpatient Diabetes Coordinator Pager [phone] (8a-5p)

## 2017-11-20 NOTE — Op Note (Signed)
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 Excision of squamous cell carcinoma of left neck 5 x 7 cm with intermediate layered closure. 2. Excision of squamous cell carcinoma 2 x 2 cm deep margin. 3. Excision of squamous cell carcinoma of right leg 3 x 6 cm. 4. Placement of Acell to right leg 1 gm and 5 x 5 cm sheet. 5. Placement of Prevena VAC to right leg.  SURGEON: Kiev Labrosse Sanger Rannie Craney, DO  EBL: 5 cc  CONDITION: Stable  COMPLICATIONS: None  INDICATION: The patient, Bonham, is a 72 y.o. male born on [birth year], is here for treatment of squamous cell carcinoma of the left neck and right leg.  Due to his kidney failure and heart failure the decision was made to do this in the operating room.  Risks and complication were discussed with the patient and he was fully aware and agreed for the surgery.  The LVAD coordinator was present during the entire case.  PROCEDURE DETAILS:  The patient was seen prior to surgery and marked.  The IV antibiotics were given. The patient was taken to the operating room and given a general anesthetic. A standard time out was performed and all information was confirmed by those in the room. SCD was placed on left leg.  The right leg and left neck was prepped with betadine and draped in the usual sterile fashion.  Local was injected for intraoperative hemostasis and postoperative pain control.  After waiting seven minutes for the local to work the #15 blade was used to excise the left neck cancer with 1 cm margins for the ulcer.  Hemostasis was achieved with electrocautery used in bursts and not continuous. The 5 x 7 cm specimen was marked short stitch superior at 12 o'clock and long stitch 3 o'clock. The central area of the tumor looked to be deep.  A deep margin 2 x 2 cmwas obtained and a stitch  placed on the deep side.  The muscle was then showing but no nodes or muscle involvement was noted. Undermining was done for 4 cm posterior and 2 cm anterior to gain closure without tension.  The deep layer was closed with the 3-0 and 4-0 Monocryl.  The skin was closed with running and vertical mattress 4-0 Monocryl. Derma bond and a sterile dressing was applied.     The right leg was injected with local and the #15 blade was used to excise the skin 3 x 6 cm with a short stitch superior at 12 o'clock and long stitch 3 o'clock. The deep layer did not look involved.  Hemostasis was achieved with electrocautery. All of the acell powder and sheet was applied and secured with the 5-0 Vicryl.  Sorbact was applied and KY gel.  The Prevena VAC was then placed and there was an excellent seal.  The leg was wrapped with kerlex and an ace wrap.  The patient was allowed to wake up and taken to recovery room in stable condition at the end of the case.

## 2017-11-20 NOTE — Interval H&P Note (Signed)
History and Physical Interval Note:  11/20/2017 7:28 AM  Trevor [name]  has presented today for surgery, with the diagnosis of SQUAMOUS CELL CARCINOMA  The various methods of treatment have been discussed with the patient and family. After consideration of risks, benefits and other options for treatment, the patient has consented to  Procedure(s): EXCISION OF LEFT NECK AND RIGHT LEG SKIN CANCER WITH A CELL PLACEMENT (Left) APPLICATION OF A-CELL (Left) as a surgical intervention .  [name] history has been reviewed, patient examined, no change in status, stable for surgery.  I have reviewed the patient's chart and labs.  Questions were answered to the patient's satisfaction.     Loel Lofty Dillingham

## 2017-11-20 NOTE — Progress Notes (Signed)
CBG 442 - medicated w/Novolog Insulin 18 units - 15 units for coverage up to 400 plus 3 units standing dose each meal.  Patient states he consumed a couple peppermint patties this afternoon as a treat from his sister post-op.  He knew it would not help his blood sugar and is aware of standard intake measures for blood sugar control.  HF team notified; await any additional orders.

## 2017-11-20 NOTE — Anesthesia Procedure Notes (Signed)
Procedure Name: LMA Insertion Date/Time: 11/20/2017 9:06 AM Performed by: Leonor Liv, CRNA Pre-anesthesia Checklist: Patient identified, Emergency Drugs available, Suction available and Patient being monitored Patient Re-evaluated:Patient Re-evaluated prior to induction Oxygen Delivery Method: Circle System Utilized Preoxygenation: Pre-oxygenation with 100% oxygen Induction Type: IV induction Ventilation: Mask ventilation without difficulty LMA: LMA inserted LMA Size: 5.0 Number of attempts: 1 Placement Confirmation: positive ETCO2 Tube secured with: Tape Dental Injury: Teeth and Oropharynx as per pre-operative assessment

## 2017-11-20 NOTE — Progress Notes (Addendum)
Patient ID: Trevor [name], male   DOB: 05/29/1946, 72 y.o.   MRN: [id number]   Advanced Heart Failure VAD Team Note  Subjective:    No complaints this morning.  INR 1.4.  Plan for surgery today.  HD yesterday.   LVAD INTERROGATION:  HeartMate II LVAD:  Flow 5 liters/min, speed 9600, power 5.9, PI 3.6.  Occasional PI events.   Objective:    Vital Signs:   Temp:  [97.3 F (36.3 C)-98.9 F (37.2 C)] 98.1 F (36.7 C) (01/17 0413) Pulse Rate:  [53-89] 89 (01/17 0413) Resp:  [14-19] 19 (01/17 0413) BP: (84-138)/(42-97) 85/56 (01/17 0413) SpO2:  [96 %-99 %] 97 % (01/17 0717) Weight:  [176 lb 2.4 oz (79.9 kg)-182 lb 12.2 oz (82.9 kg)] 177 lb 7.5 oz (80.5 kg) (01/17 0500) Last BM Date: 11/19/17 Mean arterial Pressure 80s  Intake/Output:   Intake/Output Summary (Last 24 hours) at 11/20/2017 0731 Last data filed at 11/20/2017 0300 Gross per 24 hour  Intake 999.71 ml  Output 3000 ml  Net -2000.29 ml     Physical Exam    GENERAL: Well appearing this am. NAD.  HEENT: Normal. NECK: Supple, JVP 7-8 cm. Carotids OK.  CARDIAC:  Mechanical heart sounds with LVAD hum present.  LUNGS:  CTAB, normal effort.  ABDOMEN:  NT, ND, no HSM. No bruits or masses. +BS  LVAD exit site: Well-healed and incorporated. Dressing dry and intact. No erythema or drainage. Stabilization device present and accurately applied. Driveline dressing changed daily per sterile technique. EXTREMITIES:  Warm and dry. No cyanosis, clubbing, rash.  Trace ankle edema.   NEUROLOGIC:  Alert & oriented x 3. Cranial nerves grossly intact. Moves all 4 extremities w/o difficulty. Affect pleasant     Telemetry   Atrial fibrillation rate 70s, personally reviewed.   Labs   Basic Metabolic Panel: Recent Labs  Lab 11/18/17 1137 11/19/17 0227 11/20/17 0240  NA 131* 131* 131*  K 4.5 4.6 3.6  CL 92* 93* 93*  CO2 25 24 26   GLUCOSE 241* 187* 243*  BUN 35* 47* 25*  CREATININE 5.51* 6.30* 4.32*  CALCIUM 8.7* 8.5* 8.3*    PHOS  --  6.3* 4.6    Liver Function Tests: Recent Labs  Lab 11/18/17 1137 11/19/17 0227 11/20/17 0240  AST 31  --   --   ALT 20  --   --   ALKPHOS 81  --   --   BILITOT 1.0  --   --   PROT 7.5  --   --   ALBUMIN 3.8 3.3* 3.3*   No results for input(s): LIPASE, AMYLASE in the last 168 hours. No results for input(s): AMMONIA in the last 168 hours.  CBC: Recent Labs  Lab 11/18/17 1137 11/19/17 0227 11/20/17 0240  WBC 10.1 10.4 8.8  NEUTROABS 7.6  --   --   HGB 12.2* 11.4* 11.0*  HCT 38.0* 34.6* 34.8*  MCV 94.8 93.3 93.5  PLT 110* 102* 115*    INR: Recent Labs  Lab 11/13/17 0851 11/18/17 1137 11/18/17 2101 11/19/17 0227 11/20/17 0240  INR 2.49 2.12 1.90 1.89 1.42    Other results:  EKG:    Imaging   No results found.   Medications:     Scheduled Medications: . atorvastatin  40 mg Oral q1800  . budesonide  0.5 mg Inhalation BID  . busPIRone  5 mg Oral BID  . calcitRIOL  0.5 mcg Oral Q M,W,F-HD  . Chlorhexidine Gluconate Cloth  6  each Topical Once  . Chlorhexidine Gluconate Cloth  6 each Topical Once  . darbepoetin (ARANESP) injection - DIALYSIS  100 mcg Intravenous Q Wed-HD  . docusate sodium  100 mg Oral Daily  . ferric citrate  210 mg Oral With snacks  . ferric citrate  420 mg Oral TID WC  . gabapentin  300 mg Oral BID  . insulin aspart  0-15 Units Subcutaneous TID WC  . insulin aspart  0-5 Units Subcutaneous QHS  . insulin aspart  3 Units Subcutaneous TID WC  . levothyroxine  25 mcg Oral QAC breakfast  . multivitamin  1 tablet Oral QHS  . octreotide  10 mg Intramuscular Q30 days  . pantoprazole  40 mg Oral Daily  . sildenafil  40 mg Oral TID    Infusions: .  ceFAZolin (ANCEF) IV    . heparin 1,350 Units/hr (11/20/17 0300)    PRN Medications: acetaminophen, albuterol, guaiFENesin, ondansetron (ZOFRAN) IV, traMADol, traZODone   Patient Profile   Trevor [name] is a 72 y.o. male  history of CAD s/p CABG x 4 2010, OSA, AS with  TAVR 2015, ESRD, DM2, paroxysmal atrial fibrillation, asthma, and ischemic cardiomyopathy with Medtronic ICD. 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 Post op period complicated by ARF leading to ESRD and need for chronic dialysis.   Admitted 11/18/17 for heparin bridging for Excision of SCC x 2.   Assessment/Plan:    1. 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 11/20/17 - INR 1.4, on heparin gtt. Will restart warfarin after procedure.  2. Chronic systolic CHF: Ischemic cardiomyopathy, EF 20-25% with RV dysfunction on 2/18 echo pre-LVAD. 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 Medtronic ICD. Volume status looks stable on exam with HD. MAP stable today.  - Continue sildenafil 40 TID for RV failure - Heparin gtt for INR < 2.  3. CAD: s/p CABG: No s/s of ischemia.    4. ESRD: HD again tomorrow.  Will need LVAD-trained staff monitoring.  5. Atrial fibrillation: Chronic. - On coumadin. Bridging with heparin as above.  6. RV failure: Continue Revatio as above.  7. FMB:WGYK stable.  8. Anemia: Baseline anemia of renal disease/chronic disease but recent bleeding from colonic AVMs.  - Hgb stable today.   9. 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 Off ASA with goal INR 2-2.5.  - Continue octreotide as outpatient.  10. H/o TAVR:  Stable on most recent echo.   I reviewed the LVAD parameters from today, and compared the results to the patient's prior recorded data.  No programming changes were made.  The LVAD is functioning within specified parameters.  The patient performs LVAD self-test daily.  LVAD interrogation was negative for any significant power changes, alarms or PI events/speed drops.  LVAD equipment check completed and is in good working order.  Back-up equipment present.   LVAD education done on emergency procedures and precautions and reviewed exit site care.  Length of Stay: 2  Loralie Champagne, MD 11/20/2017, 7:31 AM  VAD Team --- VAD ISSUES  ONLY--- Pager [phone] (7am - 7am)  Advanced Heart Failure Team  Pager [phone] (M-F; 7a - 4p)  Please contact [facility] for night-coverage after hours (4p -7a ) and weekends on [email or web address]

## 2017-11-20 NOTE — Progress Notes (Signed)
ANTICOAGULATION CONSULT NOTE  Pharmacy Consult for IV heparin  Indication: LVAD  No Active Allergies  Patient Measurements: Height: 5\' 5"  (165.1 cm) Weight: 177 lb 7.5 oz (80.5 kg) IBW/kg (Calculated) : 61.5 Heparin Dosing Weight: 78 kg  Vital Signs: Temp: 97.2 F (36.2 C) (01/17 2018) Temp Source: Oral (01/17 2018) BP: 89/73 (01/17 2018) Pulse Rate: 77 (01/17 2018)  Labs: Recent Labs    11/18/17 1137 11/18/17 2101 11/19/17 0227  11/19/17 1500 11/20/17 0240 11/20/17 2111  HGB 12.2*  --  11.4*  --   --  11.0*  --   HCT 38.0*  --  34.6*  --   --  34.8*  --   PLT 110*  --  102*  --   --  115*  --   LABPROT 23.5* 21.7* 21.5*  --   --  17.2*  --   INR 2.12 1.90 1.89  --   --  1.42  --   HEPARINUNFRC  --   --   --    < > 0.70 0.51 0.37  CREATININE 5.51*  --  6.30*  --   --  4.32*  --    < > = values in this interval not displayed.    Estimated Creatinine Clearance: 15.1 mL/min (A) (by C-G formula based on SCr of 4.32 mg/dL (H)).  Assessment: 72 yo male with LVAD, admitted for IV heparin bridge before dermatologic surgery 1/17.  PTA Coumadin dose 7.5 mg daily, last dose taken 1/13 per patient report.  Heparin level is therapeutic at 0.37 on 1400 units/hr. S/p excision of squamous cell carcinoma.  Heparin held this am prior to OR and restart 2hr after procedure end.  No bolus.  CBC is stable. No bleeding issues noted.  PTA warfarin dose 7.5mg  daily  Goal of Therapy:  INR 2-2.5 Heparin level 0.3-0.7 units/ml Monitor platelets by anticoagulation protocol: Yes   Plan:  Continue heparin at 1400 units/hr Check HL in 6hr Daily heparin level, INR and CBC  Doylene Canard, PharmD Clinical Pharmacist  Pager: [phone] Phone: [phone] 11/20/2017 10:08 PM

## 2017-11-20 NOTE — Progress Notes (Signed)
LVAD coordinator spoke with [name] for instructions on heparin. MD stated to turn off heparin. Orders received and carried out by Judson Roch, LVAD RN .

## 2017-11-20 NOTE — Plan of Care (Signed)
Continue current plan of care.

## 2017-11-20 NOTE — Progress Notes (Signed)
[place] for IV heparin > warfarin Indication: LVAD  No Active Allergies  Patient Measurements: Height: 5\' 5"  (165.1 cm) Weight: 177 lb 7.5 oz (80.5 kg) IBW/kg (Calculated) : 61.5 Heparin Dosing Weight: 78 kg  Vital Signs: Temp: 97.1 F (36.2 C) (01/17 1151) Temp Source: Oral (01/17 1151) BP: 95/70 (01/17 1047) Pulse Rate: 77 (01/17 1047)  Labs: Recent Labs    11/18/17 1137 11/18/17 2101 11/19/17 0227 11/19/17 0602 11/19/17 1500 11/20/17 0240  HGB 12.2*  --  11.4*  --   --  11.0*  HCT 38.0*  --  34.6*  --   --  34.8*  PLT 110*  --  102*  --   --  115*  LABPROT 23.5* 21.7* 21.5*  --   --  17.2*  INR 2.12 1.90 1.89  --   --  1.42  HEPARINUNFRC  --   --   --  0.28* 0.70 0.51  CREATININE 5.51*  --  6.30*  --   --  4.32*    Estimated Creatinine Clearance: 15.1 mL/min (A) (by C-G formula based on SCr of 4.32 mg/dL (H)).  Assessment: 72 yo male with LVAD, admitted for IV heparin bridge before dermatologic surgery 1/17.  PTA Coumadin dose 7.5 mg daily, last dose taken 1/13 per patient report. Platelet count low, but close to baseline.  INR 1.4 this am, heparin level 0.5 on heparin drip rate 1350uts/hr.  S/p excision of squamous cell carcinoma.  Heparin held this am prior to OR and restart 2hr after procedure end.  No bolus so will restart heparin drip slightly higher than what was running this am.  Restart warfarin as well.  Will give boost tonight as restart.   No bleeding issues noted.  PTA warfarin dose 7.5mg  daily  Goal of Therapy:  INR 2-2.5 Heparin level 0.3-0.7 units/ml Monitor platelets by anticoagulation protocol: Yes   Plan:  No bolus Restart heparin 1400 units/hr Check HL in 6hr from start Warfarin 12mg  x1 Daily heparin level, INR and CBC  [name] Pharm.D. CPP, BCPS Clinical Pharmacist [phone] 11/20/2017 12:25 PM

## 2017-11-20 NOTE — Anesthesia Postprocedure Evaluation (Signed)
Anesthesia Post Note  Patient: Trevor [name]  Procedure(s) Performed: EXCISION OF LEFT NECK AND RIGHT LEG SKIN CANCER WITH A CELL PLACEMENT (Left ) APPLICATION OF A-CELL (Left )     Patient location during evaluation: PACU Anesthesia Type: General Level of consciousness: awake and alert, oriented and patient cooperative Pain management: pain level controlled Vital Signs Assessment: post-procedure vital signs reviewed and stable Respiratory status: spontaneous breathing, nonlabored ventilation, respiratory function stable and patient connected to nasal cannula oxygen Cardiovascular status: blood pressure returned to baseline and stable Postop Assessment: no apparent nausea or vomiting Anesthetic complications: no Comments: Very stable, awake, VAD PI 4.6 with good flow    Last Vitals:  Vitals:   11/20/17 0717 11/20/17 1024  BP:    Pulse:  72  Resp:  (!) 5  Temp:  36.6 C  SpO2: 97% 100%    Last Pain:  Vitals:   11/20/17 1024  TempSrc:   PainSc: 0-No pain                 Jamoni Broadfoot,E. Rikayla Demmon

## 2017-11-20 NOTE — Progress Notes (Signed)
Subjective: Interval History: has no complaint ,ready for surgery.  Objective: Vital signs in last 24 hours: Temp:  [97.3 F (36.3 C)-98.9 F (37.2 C)] 98.1 F (36.7 C) (01/17 0413) Pulse Rate:  [53-89] 89 (01/17 0413) Resp:  [14-19] 19 (01/17 0413) BP: (84-138)/(42-97) 85/56 (01/17 0413) SpO2:  [96 %-99 %] 99 % (01/17 0413) Weight:  [79.9 kg (176 lb 2.4 oz)-82.9 kg (182 lb 12.2 oz)] 80.5 kg (177 lb 7.5 oz) (01/17 0500) Weight change: 1.3 kg (2 lb 13.9 oz)  Intake/Output from previous day: 01/16 0701 - 01/17 0700 In: 999.7 [P.O.:600; I.V.:399.7] Out: 3000  Intake/Output this shift: No intake/output data recorded.  General appearance: alert, cooperative and no distress Resp: clear to auscultation bilaterally Chest wall: IJ cath Cardio: cont LVAD hum GI: soft, non-tender; bowel sounds normal; no masses,  no organomegaly Extremities: AVF LUA Bruise healing  Lab Results: Recent Labs    11/19/17 0227 11/20/17 0240  WBC 10.4 8.8  HGB 11.4* 11.0*  HCT 34.6* 34.8*  PLT 102* 115*   BMET:  Recent Labs    11/19/17 0227 11/20/17 0240  NA 131* 131*  K 4.6 3.6  CL 93* 93*  CO2 24 26  GLUCOSE 187* 243*  BUN 47* 25*  CREATININE 6.30* 4.32*  CALCIUM 8.5* 8.3*   No results for input(s): PTH in the last 72 hours. Iron Studies: No results for input(s): IRON, TIBC, TRANSFERRIN, FERRITIN in the last 72 hours.  Studies/Results: No results found.  I have reviewed the patient's current medications.  Assessment/Plan: 1 ESRD for HD on Fri and Sat, did well yest 2 Anemia stable, on esa 3 CM with LVAD 4 HPTH 5 Skin Ca for resx 6 Anticoag ,resume hep post op 7 DM controlled P HD Fri, surgery, anticoag    LOS: 2 days   Jeneen Rinks Shemaiah Round 11/20/2017,7:02 AM

## 2017-11-21 ENCOUNTER — Encounter (HOSPITAL_COMMUNITY): Payer: Self-pay | Admitting: Plastic Surgery

## 2017-11-21 LAB — CBC
HEMATOCRIT: 32.5 % — AB (ref 39.0–52.0)
HEMOGLOBIN: 10.6 g/dL — AB (ref 13.0–17.0)
MCH: 30.2 pg (ref 26.0–34.0)
MCHC: 32.6 g/dL (ref 30.0–36.0)
MCV: 92.6 fL (ref 78.0–100.0)
Platelets: 121 10*3/uL — ABNORMAL LOW (ref 150–400)
RBC: 3.51 MIL/uL — ABNORMAL LOW (ref 4.22–5.81)
RDW: 18.2 % — ABNORMAL HIGH (ref 11.5–15.5)
WBC: 13.4 10*3/uL — ABNORMAL HIGH (ref 4.0–10.5)

## 2017-11-21 LAB — GLUCOSE, CAPILLARY
Glucose-Capillary: 204 mg/dL — ABNORMAL HIGH (ref 65–99)
Glucose-Capillary: 143 mg/dL — ABNORMAL HIGH (ref 65–99)
Glucose-Capillary: 186 mg/dL — ABNORMAL HIGH (ref 65–99)

## 2017-11-21 LAB — PROTIME-INR
INR: 1.33
PROTHROMBIN TIME: 16.4 s — AB (ref 11.4–15.2)

## 2017-11-21 LAB — LACTATE DEHYDROGENASE: LDH: 268 U/L — ABNORMAL HIGH (ref 98–192)

## 2017-11-21 LAB — HEPARIN LEVEL (UNFRACTIONATED): HEPARIN UNFRACTIONATED: 0.53 [IU]/mL (ref 0.30–0.70)

## 2017-11-21 MED ORDER — OXYCODONE-ACETAMINOPHEN 5-325 MG PO TABS
1.0000 | ORAL_TABLET | Freq: Once | ORAL | Status: AC
  Administered 2017-11-21: 1 via ORAL
  Filled 2017-11-21: qty 1

## 2017-11-21 MED ORDER — PENTAFLUOROPROP-TETRAFLUOROETH EX AERO: 1.0000 "application " | INHALATION_SPRAY | CUTANEOUS | Status: DC | PRN

## 2017-11-21 MED ORDER — SODIUM CHLORIDE 0.9 % IV SOLN: 100.0000 mL | INTRAVENOUS | Status: DC | PRN

## 2017-11-21 MED ORDER — LIDOCAINE HCL (PF) 1 % IJ SOLN: 5.0000 mL | INTRAMUSCULAR | Status: DC | PRN

## 2017-11-21 MED ORDER — ALTEPLASE 2 MG IJ SOLR: 2.0000 mg | Freq: Once | INTRAMUSCULAR | Status: DC | PRN

## 2017-11-21 MED ORDER — HEPARIN SODIUM (PORCINE) 1000 UNIT/ML DIALYSIS: 1000.0000 [IU] | INTRAMUSCULAR | Status: DC | PRN

## 2017-11-21 MED ORDER — HEPARIN SODIUM (PORCINE) 1000 UNIT/ML DIALYSIS
100.0000 [IU]/kg | INTRAMUSCULAR | Status: DC | PRN
  Filled 2017-11-21: qty 9

## 2017-11-21 MED ORDER — LIDOCAINE-PRILOCAINE 2.5-2.5 % EX CREA: 1.0000 "application " | TOPICAL_CREAM | CUTANEOUS | Status: DC | PRN

## 2017-11-21 MED ORDER — TRAMADOL HCL 50 MG PO TABS
ORAL_TABLET | ORAL | Status: AC
Start: 2017-11-21 — End: 2017-11-21
  Filled 2017-11-21: qty 2

## 2017-11-21 MED ORDER — WARFARIN SODIUM 2 MG PO TABS
12.0000 mg | ORAL_TABLET | Freq: Once | ORAL | Status: AC
  Administered 2017-11-21: 18:00:00 12 mg via ORAL
  Filled 2017-11-21: qty 1

## 2017-11-21 NOTE — Plan of Care (Signed)
Pt is continuing to ambulate frequently in the hallways. Pt tolerates activity well with no complications. Will continue to monitor progress.

## 2017-11-21 NOTE — Progress Notes (Signed)
Patient ID: Trevor [name], male   DOB: 1946/06/13, 72 y.o.   MRN: [id number]   Advanced Heart Failure VAD Team Note  Subjective:    No complaints this morning.  INR 1.3.  He had excision of squamous cell CA x 2 from neck and right leg, has wound vac on leg.  Back on heparin and warfarin.   LVAD INTERROGATION:  HeartMate II LVAD:  Flow 5.3 liters/min, speed 9600, power 6, PI 5.0.  Occasional PI events.   Objective:    Vital Signs:   Temp:  [97.1 F (36.2 C)-98 F (36.7 C)] 97.8 F (36.6 C) (01/18 0359) Pulse Rate:  [72-85] 83 (01/18 0359) Resp:  [5-25] 17 (01/18 0559) BP: (83-95)/(52-73) 90/62 (01/18 0359) SpO2:  [94 %-100 %] 97 % (01/18 0359) Weight:  [182 lb 5.1 oz (82.7 kg)] 182 lb 5.1 oz (82.7 kg) (01/18 0500) Last BM Date: 11/19/17 Mean arterial Pressure 70s-80s  Intake/Output:   Intake/Output Summary (Last 24 hours) at 11/21/2017 0749 Last data filed at 11/21/2017 0557 Gross per 24 hour  Intake 742.34 ml  Output 145 ml  Net 597.34 ml     Physical Exam    GENERAL: Well appearing this am. NAD.  HEENT: Normal. NECK: Supple, JVP 8 cm. Carotids OK.  CARDIAC:  Mechanical heart sounds with LVAD hum present.  LUNGS:  CTAB, normal effort.  ABDOMEN:  NT, ND, no HSM. No bruits or masses. +BS  LVAD exit site: Well-healed and incorporated. Dressing dry and intact. No erythema or drainage. Stabilization device present and accurately applied. Driveline dressing changed daily per sterile technique. EXTREMITIES:  Warm and dry. No cyanosis, clubbing, rash, or edema.  NEUROLOGIC:  Alert & oriented x 3. Cranial nerves grossly intact. Moves all 4 extremities w/o difficulty. Affect pleasant   SKIN: Wound vac right leg  Telemetry   Atrial fibrillation rate 70s, personally reviewed.   Labs   Basic Metabolic Panel: Recent Labs  Lab 11/18/17 1137 11/19/17 0227 11/20/17 0240  NA 131* 131* 131*  K 4.5 4.6 3.6  CL 92* 93* 93*  CO2 25 24 26   GLUCOSE 241* 187* 243*  BUN 35* 47*  25*  CREATININE 5.51* 6.30* 4.32*  CALCIUM 8.7* 8.5* 8.3*  PHOS  --  6.3* 4.6    Liver Function Tests: Recent Labs  Lab 11/18/17 1137 11/19/17 0227 11/20/17 0240  AST 31  --   --   ALT 20  --   --   ALKPHOS 81  --   --   BILITOT 1.0  --   --   PROT 7.5  --   --   ALBUMIN 3.8 3.3* 3.3*   No results for input(s): LIPASE, AMYLASE in the last 168 hours. No results for input(s): AMMONIA in the last 168 hours.  CBC: Recent Labs  Lab 11/18/17 1137 11/19/17 0227 11/20/17 0240 11/21/17 0307  WBC 10.1 10.4 8.8 13.4*  NEUTROABS 7.6  --   --   --   HGB 12.2* 11.4* 11.0* 10.6*  HCT 38.0* 34.6* 34.8* 32.5*  MCV 94.8 93.3 93.5 92.6  PLT 110* 102* 115* 121*    INR: Recent Labs  Lab 11/18/17 1137 11/18/17 2101 11/19/17 0227 11/20/17 0240 11/21/17 0307  INR 2.12 1.90 1.89 1.42 1.33    Other results:  EKG:    Imaging   No results found.   Medications:     Scheduled Medications: . atorvastatin  40 mg Oral q1800  . budesonide  0.5 mg Inhalation BID  .  busPIRone  5 mg Oral BID  . calcitRIOL  0.5 mcg Oral Q M,W,F-HD  . Chlorhexidine Gluconate Cloth  6 each Topical Once  . Chlorhexidine Gluconate Cloth  6 each Topical Once  . darbepoetin (ARANESP) injection - DIALYSIS  100 mcg Intravenous Q Wed-HD  . docusate sodium  100 mg Oral Daily  . ferric citrate  210 mg Oral With snacks  . ferric citrate  420 mg Oral TID WC  . gabapentin  300 mg Oral BID  . insulin aspart  0-15 Units Subcutaneous TID WC  . insulin aspart  0-5 Units Subcutaneous QHS  . insulin aspart  3 Units Subcutaneous TID WC  . levothyroxine  25 mcg Oral QAC breakfast  . multivitamin  1 tablet Oral QHS  . pantoprazole  40 mg Oral Daily  . sildenafil  40 mg Oral TID  . Warfarin - Pharmacist Dosing Inpatient   Does not apply q1800    Infusions: . sodium chloride 10 mL/hr at 11/20/17 0810  . heparin 1,400 Units/hr (11/21/17 0743)    PRN Medications: acetaminophen, albuterol, guaiFENesin,  ondansetron (ZOFRAN) IV, traMADol, traZODone   Patient Profile   Trevor [name] is a 72 y.o. male  history of CAD s/p CABG x 4 2010, OSA, AS with TAVR 2015, ESRD, DM2, paroxysmal atrial fibrillation, asthma, and ischemic cardiomyopathy with Medtronic ICD. 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 Post op period complicated by ARF leading to ESRD and need for chronic dialysis.   Admitted 11/18/17 for heparin bridging for Excision of SCC x 2.   Assessment/Plan:    1. 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 2. Chronic systolic CHF: Ischemic cardiomyopathy, EF 20-25% with RV dysfunction on 2/18 echo pre-LVAD. 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 Medtronic ICD. Volume status looks stable on exam with HD. MAP stable today.  - Continue sildenafil 40 TID for RV failure - He is back on warfarin/heparin overlap with INR 1.3. Can turn off heparin gtt and go home when INR > 1.8.   3. CAD: s/p CABG: No s/s of ischemia.    4. ESRD: HD today.  Will need LVAD-trained staff monitoring.  5. Atrial fibrillation: Chronic. - On coumadin. Bridging with heparin as above.  6. RV failure: Continue Revatio as above.  7. POE:UMPN stable.  8. Anemia: Baseline anemia of renal disease/chronic disease but recent bleeding from colonic AVMs.  - Hgb stable today.   9. 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 Off ASA with goal INR 2-2.5.  - Continue octreotide as outpatient.  10. H/o TAVR:  Stable on most recent echo.   I reviewed the LVAD parameters from today, and compared the results to the patient's prior recorded data.  No programming changes were made.  The LVAD is functioning within specified parameters.  The patient performs LVAD self-test daily.  LVAD interrogation was negative for any significant power changes, alarms or PI events/speed drops.  LVAD equipment check completed and is in good working order.  Back-up equipment present.   LVAD education done  on emergency procedures and precautions and reviewed exit site care.  Length of Stay: 3  Loralie Champagne, MD 11/21/2017, 7:49 AM  VAD Team --- VAD ISSUES ONLY--- Pager [phone] (7am - 7am)  Advanced Heart Failure Team  Pager [phone] (M-F; 7a - 4p)  Please contact [facility] for night-coverage after hours (4p -7a ) and weekends on [email or web address]

## 2017-11-21 NOTE — Progress Notes (Signed)
Subjective: Interval History: has no complaint , did ok yest.  Objective: Vital signs in last 24 hours: Temp:  [97.1 F (36.2 C)-98 F (36.7 C)] 97.8 F (36.6 C) (01/18 0359) Pulse Rate:  [72-85] 83 (01/18 0359) Resp:  [5-25] 17 (01/18 0559) BP: (83-95)/(52-73) 90/62 (01/18 0359) SpO2:  [94 %-100 %] 97 % (01/18 0359) Weight:  [82.7 kg (182 lb 5.1 oz)] 82.7 kg (182 lb 5.1 oz) (01/18 0500) Weight change: -0.2 kg (-7.1 oz)  Intake/Output from previous day: 01/17 0701 - 01/18 0700 In: 742.3 [I.V.:742.3] Out: 145 [Drains:125; Blood:20] Intake/Output this shift: No intake/output data recorded.  General appearance: alert, cooperative, no distress and pale Resp: clear to auscultation bilaterally Chest wall: IJ cath Cardio: cont hum of LVAD GI: LVAD tubes RUQ, pos bs, liver down 6 cm Extremities: Dressing Rcalf with drain, hematoma Rua over avf  Lab Results: Recent Labs    11/20/17 0240 11/21/17 0307  WBC 8.8 13.4*  HGB 11.0* 10.6*  HCT 34.8* 32.5*  PLT 115* 121*   BMET:  Recent Labs    11/19/17 0227 11/20/17 0240  NA 131* 131*  K 4.6 3.6  CL 93* 93*  CO2 24 26  GLUCOSE 187* 243*  BUN 47* 25*  CREATININE 6.30* 4.32*  CALCIUM 8.5* 8.3*   No results for input(s): PTH in the last 72 hours. Iron Studies: No results for input(s): IRON, TIBC, TRANSFERRIN, FERRITIN in the last 72 hours.  Studies/Results: No results found.  I have reviewed the patient's current medications.  Assessment/Plan: 1 ESRD for Hd 2 LVAD xs bleeding fair control 3 Skin Ca per plastics 4 DM controlled 5 Anemia esa 6 HPTH vit D P HD, esa, hep, coumadin, wound care    LOS: 3 days   Jeneen Rinks Tempie Gibeault 11/21/2017,7:07 AM

## 2017-11-21 NOTE — Progress Notes (Signed)
Dressing to right lower leg at surgical site began to become new bright red drainage, upon further inspection it appeared that the wound vac tubing had become clogged and the wound vac was unable to proper pull off excess drainage. Therefore, this nurse [name] and new order received to change wound wac dressing. Wound vac site and canister changed per MD order, pt tolerate procedure well. Will continue to monitor.

## 2017-11-21 NOTE — Progress Notes (Signed)
Personally accompanied patient to HD unit for treatment. Will stay with him for the duration of HD.  Balinda Quails RN, VAD Coordinator 24/7 pager [phone]

## 2017-11-21 NOTE — Procedures (Signed)
I was present at this session.  I have reviewed the session itself and made appropriate changes. 1 needle avf, bp in 80s, sys, LVAD.  tol well.   Trevor [name] Trevor [name] 1/18/20191:10 PM

## 2017-11-21 NOTE — Progress Notes (Signed)
LVAD Coordinator Rounding Note:  Admitted 11/18/2017 for scheduled inpatient procedure/heparin bridging.  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 Darcey Nora under Destination Therapy criteria.  Pt states that he had sleepless night with "a lot going on". Pt also states he had some mild pain overnight. Pt is normal happy self today despite lack of sleep, pain and moderate swelling with ecchymosis in his left neck.   Vital signs: HR:65AFib Doppler Pressure:90 Automatic BP:106/88 (95) O2 Sat: 98% on RA Wt in lbs: 179>177>182 lbs  LVAD interrogation reveals:  Speed:9600 Flow:6.0 Power: 6.5w PI:3.1 Alarms: none Events:10 PI events Fixed speed: 9600 Low speed limit: 9000  Drive Line: Sorbaview dressing dry and intact; Anchor intact. Weekly dressing changes. VAD Coordinator changed yesterday. Next change: 11/27/16   Labs:  INR trend: 1.90>1.89>1.42>1.33  Gtts: Heparin 1400 u/hr   Anticoagulation Plan: -INR Goal: 2.0 - 2.5. Warfarin on hold  Device: -Medtronic single lead -Therapies: on  Plan/Recommendations:   1. Please page VAD coordinator for equipment issues or patient concerns 2. Pt will need dialysis today. Pt will need to be dialyzed on the unit with VAD trained nurses only.   [name], VAD Coordinator 24/7 pager [phone]

## 2017-11-21 NOTE — Progress Notes (Signed)
1 Day Post-Op   Subjective/Chief Complaint: Patient in HD currently. He reports the right lower leg VAC tubing clotted and the dressing had to be changed. No problems following the dressing change.  Some bloody oozing at the left neck site.    Objective: Vital signs in last 24 hours: Temp:  [97.2 F (36.2 C)-98.6 F (37 C)] 97.6 F (36.4 C) (01/18 1245) Pulse Rate:  [44-85] 49 (01/18 1630) Resp:  [11-25] 17 (01/18 1253) BP: (75-125)/(38-88) 87/43 (01/18 1630) SpO2:  [94 %-98 %] 97 % (01/18 1245) Weight:  [82.7 kg (182 lb 5.1 oz)-83.9 kg (184 lb 15.5 oz)] 83.9 kg (184 lb 15.5 oz) (01/18 1245) Last BM Date: 11/19/17  Intake/Output from previous day: 01/17 0701 - 01/18 0700 In: 742.3 [I.V.:742.3] Out: 145 [Drains:125; Blood:20] Intake/Output this shift: No intake/output data recorded.  General appearance: alert, cooperative, appears stated age and no distress The right lower leg VAC dressing is in place and functioning well The left neck incision with bloody drainage and some hematoma over the area.  Lab Results:  Recent Labs    11/20/17 0240 11/21/17 0307  WBC 8.8 13.4*  HGB 11.0* 10.6*  HCT 34.8* 32.5*  PLT 115* 121*   BMET Recent Labs    11/19/17 0227 11/20/17 0240  NA 131* 131*  K 4.6 3.6  CL 93* 93*  CO2 24 26  GLUCOSE 187* 243*  BUN 47* 25*  CREATININE 6.30* 4.32*  CALCIUM 8.5* 8.3*   PT/INR Recent Labs    11/20/17 0240 11/21/17 0307  LABPROT 17.2* 16.4*  INR 1.42 1.33   ABG No results for input(s): PHART, HCO3 in the last 72 hours.  Invalid input(s): PCO2, PO2  Studies/Results: No results found.  Anti-infectives: Anti-infectives (From admission, onward)   Start     Dose/Rate Route Frequency Ordered Stop   11/20/17 0800  ceFAZolin (ANCEF) IVPB 2g/100 mL premix  Status:  Discontinued     2 g 200 mL/hr over 30 Minutes Intravenous To ShortStay Surgical 11/19/17 2312 11/20/17 1054   11/20/17 0600  ceFAZolin (ANCEF) IVPB 2g/100 mL premix   Status:  Discontinued     2 g 200 mL/hr over 30 Minutes Intravenous On call to O.R. 11/19/17 2312 11/19/17 2327      Assessment/Plan: s/p Procedure(s): EXCISION OF LEFT NECK AND RIGHT LEG SKIN CANCER WITH A CELL PLACEMENT (Left) APPLICATION OF A-CELL (Left) Monitor drainage from left neck incision.   Continues heparin gtt and started back on warfarin.  To have HD again tomorrow and will recheck tomorrow.   LOS: 3 days    Juvencio Verdi,PA-C Plastic Surgery [phone]

## 2017-11-21 NOTE — Progress Notes (Signed)
ANTICOAGULATION CONSULT NOTE  Pharmacy Consult for IV heparin  Indication: LVAD  No Active Allergies  Patient Measurements: Height: 5\' 5"  (165.1 cm) Weight: 182 lb 5.1 oz (82.7 kg) IBW/kg (Calculated) : 61.5 Heparin Dosing Weight: 78 kg  Vital Signs: Temp: 98.6 F (37 C) (01/18 0743) Temp Source: Oral (01/18 0743) BP: 106/88 (01/18 0743) Pulse Rate: 65 (01/18 0743)  Labs: Recent Labs    11/18/17 1137  11/19/17 0227  11/20/17 0240 11/20/17 2111 11/21/17 0307  HGB 12.2*  --  11.4*  --  11.0*  --  10.6*  HCT 38.0*  --  34.6*  --  34.8*  --  32.5*  PLT 110*  --  102*  --  115*  --  121*  LABPROT 23.5*   < > 21.5*  --  17.2*  --  16.4*  INR 2.12   < > 1.89  --  1.42  --  1.33  HEPARINUNFRC  --   --   --    < > 0.51 0.37 0.53  CREATININE 5.51*  --  6.30*  --  4.32*  --   --    < > = values in this interval not displayed.    Estimated Creatinine Clearance: 15.3 mL/min (A) (by C-G formula based on SCr of 4.32 mg/dL (H)).  Assessment: 72 yo male with LVAD, admitted for IV heparin bridge before dermatologic surgery 1/17.  PTA Coumadin dose 7.5 mg daily, last dose taken 1/13 per patient report.  Heparin level is therapeutic at 0.5 on heparin drip rate 1400 units/hr. S/p excision of squamous cell carcinoma.  Heparin held prior to OR and restart 2hr after procedure end.  No bolus.  CBC is stable. No bleeding issues noted. Restarted warfarin last pm INR still low 1.3   PTA warfarin dose 7.5mg  daily  Goal of Therapy:  INR 2-2.5 Heparin level 0.3-0.7 units/ml Monitor platelets by anticoagulation protocol: Yes   Plan:  Continue heparin at 1400 units/hr Warfarin 12mg  x1 again today Daily heparin level, INR and CBC   [name] Pharm.D. CPP, BCPS Clinical Pharmacist [phone] 11/21/2017 9:25 AM

## 2017-11-22 DIAGNOSIS — C4442 Squamous cell carcinoma of skin of scalp and neck: Principal | ICD-10-CM

## 2017-11-22 DIAGNOSIS — N186 End stage renal disease: Secondary | ICD-10-CM

## 2017-11-22 LAB — GLUCOSE, CAPILLARY
Glucose-Capillary: 132 mg/dL — ABNORMAL HIGH (ref 65–99)
Glucose-Capillary: 102 mg/dL — ABNORMAL HIGH (ref 65–99)
Glucose-Capillary: 174 mg/dL — ABNORMAL HIGH (ref 65–99)
Glucose-Capillary: 176 mg/dL — ABNORMAL HIGH (ref 65–99)

## 2017-11-22 LAB — BASIC METABOLIC PANEL WITH GFR
Anion gap: 13 (ref 5–15)
BUN: 21 mg/dL — ABNORMAL HIGH (ref 6–20)
CO2: 25 mmol/L (ref 22–32)
Calcium: 8.8 mg/dL — ABNORMAL LOW (ref 8.9–10.3)
Chloride: 94 mmol/L — ABNORMAL LOW (ref 101–111)
Creatinine, Ser: 3.78 mg/dL — ABNORMAL HIGH (ref 0.61–1.24)
GFR calc Af Amer: 17 mL/min — ABNORMAL LOW
GFR calc non Af Amer: 15 mL/min — ABNORMAL LOW
Glucose, Bld: 122 mg/dL — ABNORMAL HIGH (ref 65–99)
Potassium: 4.1 mmol/L (ref 3.5–5.1)
Sodium: 132 mmol/L — ABNORMAL LOW (ref 135–145)

## 2017-11-22 LAB — PROTIME-INR
INR: 1.6
PROTHROMBIN TIME: 18.9 s — AB (ref 11.4–15.2)

## 2017-11-22 LAB — CBC
HCT: 33.3 % — ABNORMAL LOW (ref 39.0–52.0)
HCT: 31.4 % — ABNORMAL LOW (ref 39.0–52.0)
Hemoglobin: 10.8 g/dL — ABNORMAL LOW (ref 13.0–17.0)
Hemoglobin: 10 g/dL — ABNORMAL LOW (ref 13.0–17.0)
MCH: 30.1 pg (ref 26.0–34.0)
MCH: 30.4 pg (ref 26.0–34.0)
MCHC: 32.4 g/dL (ref 30.0–36.0)
MCHC: 31.8 g/dL (ref 30.0–36.0)
MCV: 92.8 fL (ref 78.0–100.0)
MCV: 95.4 fL (ref 78.0–100.0)
Platelets: 130 10*3/uL — ABNORMAL LOW (ref 150–400)
Platelets: 132 10*3/uL — ABNORMAL LOW (ref 150–400)
RBC: 3.59 MIL/uL — ABNORMAL LOW (ref 4.22–5.81)
RBC: 3.29 MIL/uL — ABNORMAL LOW (ref 4.22–5.81)
RDW: 18.2 % — ABNORMAL HIGH (ref 11.5–15.5)
RDW: 18.8 % — ABNORMAL HIGH (ref 11.5–15.5)
WBC: 12.9 10*3/uL — ABNORMAL HIGH (ref 4.0–10.5)
WBC: 10.8 10*3/uL — ABNORMAL HIGH (ref 4.0–10.5)

## 2017-11-22 LAB — LACTATE DEHYDROGENASE: LDH: 261 U/L — ABNORMAL HIGH (ref 98–192)

## 2017-11-22 LAB — HEPARIN LEVEL (UNFRACTIONATED)
HEPARIN UNFRACTIONATED: 0.87 [IU]/mL — AB (ref 0.30–0.70)
Heparin Unfractionated: 0.61 [IU]/mL (ref 0.30–0.70)

## 2017-11-22 MED ORDER — OXYCODONE HCL 5 MG PO TABS
5.0000 mg | ORAL_TABLET | Freq: Three times a day (TID) | ORAL | Status: DC | PRN
  Administered 2017-11-22 – 2017-12-02 (×16): 5 mg via ORAL
  Filled 2017-11-22 (×16): qty 1

## 2017-11-22 MED ORDER — WARFARIN SODIUM 2 MG PO TABS
12.0000 mg | ORAL_TABLET | Freq: Once | ORAL | Status: AC
  Administered 2017-11-22: 17:00:00 12 mg via ORAL
  Filled 2017-11-22: qty 1

## 2017-11-22 NOTE — Progress Notes (Signed)
Patient ID: Trevor [name], male   DOB: 05-22-1946, 72 y.o.   MRN: [id number]   Advanced Heart Failure VAD Team Note  Subjective:    S/p excision of squamous cell CA x 2 from neck and right leg 1/17  Rough night. Fatigued. Had bleeding from R leg. Wound vac removed this am.   Denies SOB or CP. No orthopnea or PND  Remains on heparin. INR 1.6 , has wound vac on leg.  Back on heparin and warfarin.   LVAD INTERROGATION:  HeartMate II LVAD:  Flow 5.3 liters/min, speed 9600, power 6.1, PI 3.2.  Occasional PI events.   Objective:    Vital Signs:   Temp:  [97.1 F (36.2 C)-97.6 F (36.4 C)] 97.4 F (36.3 C) (01/19 0854) Pulse Rate:  [41-76] 76 (01/19 0854) Resp:  [11-25] 12 (01/19 0854) BP: (75-125)/(38-83) 89/77 (01/19 0854) SpO2:  [97 %-100 %] 100 % (01/19 0933) Weight:  [81.1 kg (178 lb 12.7 oz)-83.9 kg (184 lb 15.5 oz)] 81.1 kg (178 lb 12.7 oz) (01/19 0547) Last BM Date: 11/19/17 Mean arterial Pressure 80s  Intake/Output:   Intake/Output Summary (Last 24 hours) at 11/22/2017 0955 Last data filed at 11/22/2017 0700 Gross per 24 hour  Intake 694.36 ml  Output 2350 ml  Net -1655.64 ml     Physical Exam    General: Lying in bed fatigued. NAD HEENT: normal  Large left neck ecchymosis. + dressing  Neck: supple. JVP not elevated.  Carotids 2+ bilat; no bruits. No lymphadenopathy or thryomegaly appreciated. Cor: LVAD hum.  Left chest tunneled cath Lungs: Clear. Abdomen: obese soft, nontender, non-distended. No hepatosplenomegaly. No bruits or masses. Good bowel sounds. Driveline site clean. Anchor in place.  Extremities: no cyanosis, clubbing, rash. Warm no edema  RLE dressing wrap  Neuro: alert & oriented x 3. No focal deficits. Moves all 4 without problem    Telemetry   Atrial fibrillation rate 60-70s +PVCs, personally reviewed.   Labs   Basic Metabolic Panel: Recent Labs  Lab 11/18/17 1137 11/19/17 0227 11/20/17 0240 11/22/17 0131  NA 131* 131* 131* 132*  K  4.5 4.6 3.6 4.1  CL 92* 93* 93* 94*  CO2 25 24 26 25   GLUCOSE 241* 187* 243* 122*  BUN 35* 47* 25* 21*  CREATININE 5.51* 6.30* 4.32* 3.78*  CALCIUM 8.7* 8.5* 8.3* 8.8*  PHOS  --  6.3* 4.6  --     Liver Function Tests: Recent Labs  Lab 11/18/17 1137 11/19/17 0227 11/20/17 0240  AST 31  --   --   ALT 20  --   --   ALKPHOS 81  --   --   BILITOT 1.0  --   --   PROT 7.5  --   --   ALBUMIN 3.8 3.3* 3.3*   No results for input(s): LIPASE, AMYLASE in the last 168 hours. No results for input(s): AMMONIA in the last 168 hours.  CBC: Recent Labs  Lab 11/18/17 1137 11/19/17 0227 11/20/17 0240 11/21/17 0307 11/22/17 0131  WBC 10.1 10.4 8.8 13.4* 12.9*  NEUTROABS 7.6  --   --   --   --   HGB 12.2* 11.4* 11.0* 10.6* 10.8*  HCT 38.0* 34.6* 34.8* 32.5* 33.3*  MCV 94.8 93.3 93.5 92.6 92.8  PLT 110* 102* 115* 121* 130*    INR: Recent Labs  Lab 11/18/17 2101 11/19/17 0227 11/20/17 0240 11/21/17 0307 11/22/17 0131  INR 1.90 1.89 1.42 1.33 1.60    Other results:  EKG:  Imaging   No results found.   Medications:     Scheduled Medications: . atorvastatin  40 mg Oral q1800  . budesonide  0.5 mg Inhalation BID  . busPIRone  5 mg Oral BID  . calcitRIOL  0.5 mcg Oral Q M,W,F-HD  . Chlorhexidine Gluconate Cloth  6 each Topical Once  . Chlorhexidine Gluconate Cloth  6 each Topical Once  . darbepoetin (ARANESP) injection - DIALYSIS  100 mcg Intravenous Q Wed-HD  . docusate sodium  100 mg Oral Daily  . ferric citrate  210 mg Oral With snacks  . ferric citrate  420 mg Oral TID WC  . gabapentin  300 mg Oral BID  . insulin aspart  0-15 Units Subcutaneous TID WC  . insulin aspart  0-5 Units Subcutaneous QHS  . insulin aspart  3 Units Subcutaneous TID WC  . levothyroxine  25 mcg Oral QAC breakfast  . multivitamin  1 tablet Oral QHS  . pantoprazole  40 mg Oral Daily  . sildenafil  40 mg Oral TID  . Warfarin - Pharmacist Dosing Inpatient   Does not apply q1800     Infusions: . sodium chloride 10 mL/hr at 11/20/17 0810  . heparin 1,250 Units/hr (11/22/17 0400)    PRN Medications: acetaminophen, albuterol, guaiFENesin, ondansetron (ZOFRAN) IV, traMADol, traZODone   Patient Profile   Trevor [name] is a 72 y.o. male  history of CAD s/p CABG x 4 2010, OSA, AS with TAVR 2015, ESRD, DM2, paroxysmal atrial fibrillation, asthma, and ischemic cardiomyopathy with Medtronic ICD. 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 Post op period complicated by ARF leading to ESRD and need for chronic dialysis.   Admitted 11/18/17 for heparin bridging for Excision of SCC x 2.   Assessment/Plan:    1. 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 Bleeding overnight but now improved. RLE wound vac off. Will continue to follow. Appreciate Plastics help.  2. Chronic systolic CHF: Ischemic cardiomyopathy, EF 20-25% with RV dysfunction on 2/18 echo pre-LVAD. 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 Medtronic ICD. Volume status looks stable on exam with HD. MAP stable today.  - Continue sildenafil 40 TID for RV failure - He is back on warfarin/heparin overlap with INR 1.6 Hgb stable at 10.8 this am  Can turn off heparin gtt and go home when INR > 1.8.   3. CAD: s/p CABG:  - No s/ of ischemia.    4. ESRD: - Discussed with Renal. Will hold HD until Monday am unless needs sooner.  Will need LVAD-trained staff monitoring.  5. Atrial fibrillation: Chronic. - On coumadin. Bridging with heparin as above. Stop when INR 1.8.  6. RV failure: Continue Revatio as above.  7. HCW:CBJS stable 80s  8. Anemia: Baseline anemia of renal disease/chronic disease but recent bleeding from colonic AVMs.  - Hgb stable at 10.8 today.  9. 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 Off ASA with goal INR 2-2.5.  - Continue octreotide as outpatient.  10. H/o TAVR:  Stable on most recent echo.   VAD interrogated personally.  Parameters stable.  Length of Stay: [place], MD 11/22/2017, 9:55 AM  VAD Team --- VAD ISSUES ONLY--- Pager [phone] (7am - 7am)  Advanced Heart Failure Team  Pager [phone] (M-F; 7a - 4p)  Please contact [facility] for night-coverage after hours (4p -7a ) and weekends on [email or web address]

## 2017-11-22 NOTE — Progress Notes (Signed)
I spoke with [name] with the surgical team about the increased drainage from the pts wound vac and left neck incision. She gave verbal orders to remove the wound vac and reinforce with pressure dressings. Will continue to monitor.

## 2017-11-22 NOTE — Progress Notes (Signed)
Follow up appts made for [name] as follows:  Dressing change and INR with VAD Coordinator on Thursday January 24th at 0930.  2 month follow up with [name] scheduled for January 31st at 0900.  Please page VAD coordinator with any appointment questions. Thanks  [name], BSN VAD Coordinator 24/7 Pager [phone]

## 2017-11-22 NOTE — Plan of Care (Signed)
Pt is able to recognize and call out for assistance when needed. Will continue to monitor progress.

## 2017-11-22 NOTE — Progress Notes (Signed)
Subjective: Interval History: has no complaint, much drainage from wound, tired.  Objective: Vital signs in last 24 hours: Temp:  [97.1 F (36.2 C)-98.6 F (37 C)] 97.2 F (36.2 C) (01/19 0406) Pulse Rate:  [41-71] 47 (01/18 1708) Resp:  [11-25] 16 (01/19 0406) BP: (75-125)/(38-88) 83/59 (01/19 0406) SpO2:  [94 %-98 %] 98 % (01/18 2014) Weight:  [81.1 kg (178 lb 12.7 oz)-83.9 kg (184 lb 15.5 oz)] 81.1 kg (178 lb 12.7 oz) (01/19 0547) Weight change: 1.2 kg (2 lb 10.3 oz)  Intake/Output from previous day: 01/18 0701 - 01/19 0700 In: 694.4 [P.O.:360; I.V.:334.4] Out: 2350 [Drains:350] Intake/Output this shift: No intake/output data recorded.  General appearance: alert, cooperative, no distress and pale Resp: clear to auscultation bilaterally Chest wall: IJ PC Cardio: cont hum of LVAD GI: pos bs. liver down 6 cm, LVAD tubes RUQ Extremities: AVF with bruise LUA, dressing R calf  Lab Results: Recent Labs    11/21/17 0307 11/22/17 0131  WBC 13.4* 12.9*  HGB 10.6* 10.8*  HCT 32.5* 33.3*  PLT 121* 130*   BMET:  Recent Labs    11/20/17 0240 11/22/17 0131  NA 131* 132*  K 3.6 4.1  CL 93* 94*  CO2 26 25  GLUCOSE 243* 122*  BUN 25* 21*  CREATININE 4.32* 3.78*  CALCIUM 8.3* 8.8*   No results for input(s): PTH in the last 72 hours. Iron Studies: No results for input(s): IRON, TIBC, TRANSFERRIN, FERRITIN in the last 72 hours.  Studies/Results: No results found.  I have reviewed the patient's current medications.  Assessment/Plan: 1 ESRD will hold off 4th HD of week as poor intake and vol is usual indication.  2 LVAD per Cards 3 Anemia esa 4 DM controlled 5 HPTH vit D 6 Skin Ca per plastics P HD on Mon, cont esa, wound care, anticoag    LOS: 4 days   Jeneen Rinks Shakedra Beam 11/22/2017,7:08 AM

## 2017-11-22 NOTE — Progress Notes (Signed)
[place] for IV Heparin  Indication: LVAD  No Active Allergies  Patient Measurements: Height: 5\' 5"  (165.1 cm) Weight: 178 lb 12.7 oz (81.1 kg) IBW/kg (Calculated) : 61.5 Heparin Dosing Weight: 78 kg  Vital Signs: Temp: 97.4 F (36.3 C) (01/19 0854) Temp Source: Oral (01/19 0854) BP: 89/77 (01/19 0854) Pulse Rate: 76 (01/19 0854)  Labs: Recent Labs    11/20/17 0240  11/21/17 0307 11/22/17 0131 11/22/17 1101  HGB 11.0*  --  10.6* 10.8*  --   HCT 34.8*  --  32.5* 33.3*  --   PLT 115*  --  121* 130*  --   LABPROT 17.2*  --  16.4* 18.9*  --   INR 1.42  --  1.33 1.60  --   HEPARINUNFRC 0.51   < > 0.53 0.87* 0.61  CREATININE 4.32*  --   --  3.78*  --    < > = values in this interval not displayed.    Estimated Creatinine Clearance: 17.3 mL/min (A) (by C-G formula based on SCr of 3.78 mg/dL (H)).  Assessment: 72 yo male with LVAD, admitted for IV heparin bridge before dermatologic surgery 1/17.  PTA Coumadin dose 7.5 mg daily, last dose taken 1/13 per patient report.  Pt is now s/p surgery on back on heparin/warfarin bridge, heparin level was elevated this AM. Pt is having some oozing from surgical site (Hgb stable).   Given significant bleeding/oozing overnight from wounds will shoot for lower heparin level goal closer to 0.5. Will adjust heparin rate this morning to achieve this.   INR is trending up nicely to 1.6 this morning, will repeat yesterdays dose, but likely back down to home dose tomorrow. Will stop heparin when INR>1.8.  Goal of Therapy:  INR goal 2-2.5 Heparin level 0.3-0.7 units/ml Monitor platelets by anticoagulation protocol: Yes   Plan:  Decrease heparin to 1150 units/hr Monitor any further oozing from surgical site Trend Hgb (currently stable x 3 days) Repeat warfarin 12mg  tonight  Erin Hearing PharmD., BCPS Clinical Pharmacist 11/22/2017 11:43 AM

## 2017-11-22 NOTE — Progress Notes (Signed)
2 Days Post-Op   Subjective/Chief Complaint: Patient reports VAC removed from right lower leg this am due to bleeding overnight.  Ace wrap taken down and no bleeding observed on dressings. Patient reports throbbing in excision site and pain not relieved by Ultram, but received 1 percocet yesterday and this helped. OxyIR ordered if Ultram not effective.  Left neck not oozing much earlier, but dressing fairly saturated now. Left neck ecchymosis/hematoma much softer today and patient reports he feels like it is much better today. Dressing to left neck changed and nursing aware and will monitor.  Heparin level 0.87 earlier INR 1.6 today   Objective: Vital signs in last 24 hours: Temp:  [97.1 F (36.2 C)-97.6 F (36.4 C)] 97.4 F (36.3 C) (01/19 0854) Pulse Rate:  [41-76] 76 (01/19 0854) Resp:  [11-25] 12 (01/19 0854) BP: (75-125)/(38-83) 89/77 (01/19 0854) SpO2:  [97 %-100 %] 100 % (01/19 0933) Weight:  [81.1 kg (178 lb 12.7 oz)-83.9 kg (184 lb 15.5 oz)] 81.1 kg (178 lb 12.7 oz) (01/19 0547) Last BM Date: 11/19/17  Intake/Output from previous day: 01/18 0701 - 01/19 0700 In: 694.4 [P.O.:360; I.V.:334.4] Out: 2350 [Drains:350] Intake/Output this shift: No intake/output data recorded.    Lab Results:  Recent Labs    11/21/17 0307 11/22/17 0131  WBC 13.4* 12.9*  HGB 10.6* 10.8*  HCT 32.5* 33.3*  PLT 121* 130*   BMET Recent Labs    11/20/17 0240 11/22/17 0131  NA 131* 132*  K 3.6 4.1  CL 93* 94*  CO2 26 25  GLUCOSE 243* 122*  BUN 25* 21*  CREATININE 4.32* 3.78*  CALCIUM 8.3* 8.8*   PT/INR Recent Labs    11/21/17 0307 11/22/17 0131  LABPROT 16.4* 18.9*  INR 1.33 1.60   ABG No results for input(s): PHART, HCO3 in the last 72 hours.  Invalid input(s): PCO2, PO2  Studies/Results: No results found.  Anti-infectives: Anti-infectives (From admission, onward)   Start     Dose/Rate Route Frequency Ordered Stop   11/20/17 0800  ceFAZolin (ANCEF) IVPB 2g/100 mL  premix  Status:  Discontinued     2 g 200 mL/hr over 30 Minutes Intravenous To ShortStay Surgical 11/19/17 2312 11/20/17 1054   11/20/17 0600  ceFAZolin (ANCEF) IVPB 2g/100 mL premix  Status:  Discontinued     2 g 200 mL/hr over 30 Minutes Intravenous On call to O.R. 11/19/17 2312 11/19/17 2327      Assessment/Plan: s/p Procedure(s): EXCISION OF LEFT NECK AND RIGHT LEG SKIN CANCER WITH A CELL PLACEMENT (Left) APPLICATION OF A-CELL (Left) Will start daily dressing changes to the right lower leg  Monitor oozing from neck and leg.  Heparin level was up some, so hopefully less bleeding issues as level improves.    LOS: 4 days    Clementine Soulliere,PA-C Plastic Surgery [phone]

## 2017-11-22 NOTE — Progress Notes (Signed)
Pt has increased bloody drainage around surgical sites on right lower leg and left neck. Pharmacy was notified due to concerns with heparin levels. Labs were drawn and heparin levels came back elevated. Pt dosage was decreased. Will notify doctor if drainage continues. Will continue to monitor.

## 2017-11-22 NOTE — Progress Notes (Signed)
[place] for IV Heparin  Indication: LVAD  No Active Allergies  Patient Measurements: Height: 5\' 5"  (165.1 cm) Weight: 180 lb 1.9 oz (81.7 kg) IBW/kg (Calculated) : 61.5 Heparin Dosing Weight: 78 kg  Vital Signs: Temp: 97.1 F (36.2 C) (01/18 2330) Temp Source: Oral (01/18 2330) BP: 117/83 (01/18 2330) Pulse Rate: 47 (01/18 1708)  Labs: Recent Labs    11/20/17 0240 11/20/17 2111 11/21/17 0307 11/22/17 0131  HGB 11.0*  --  10.6* 10.8*  HCT 34.8*  --  32.5* 33.3*  PLT 115*  --  121* 130*  LABPROT 17.2*  --  16.4* 18.9*  INR 1.42  --  1.33 1.60  HEPARINUNFRC 0.51 0.37 0.53 0.87*  CREATININE 4.32*  --   --  3.78*    Estimated Creatinine Clearance: 17.4 mL/min (A) (by C-G formula based on SCr of 3.78 mg/dL (H)).  Assessment: 72 yo male with LVAD, admitted for IV heparin bridge before dermatologic surgery 1/17.  PTA Coumadin dose 7.5 mg daily, last dose taken 1/13 per patient report.  Pt is now s/p surgery on back on heparin/warfarin bridge, heparin level is elevated this AM. Pt is having some oozing from surgical site (Hgb stable). Will decrease heparin rate. RN aware that if bleeding gets any worse we will need to contact MD to assess pt.   Goal of Therapy:  Heparin level 0.3-0.7 units/ml Monitor platelets by anticoagulation protocol: Yes   Plan:  Dec heparin to 1250 units/hr 1100 HL Monitor any further oozing from surgical site Trend Hgb (currently stable x 3 days)  Narda Bonds, PharmD, BCPS Clinical Pharmacist Phone: [phone]

## 2017-11-22 NOTE — Progress Notes (Signed)
[name] returns page - requests Surgicel be placed to left neck incision to assist w/bleeding.  Ordered from OR

## 2017-11-22 NOTE — Progress Notes (Signed)
Left neck site continues to bleed; dressing has been changed approx six times today.  Last change was approx 2 hrs ago and it is saturated once again.  Hematoma extends under patient chin approx 7 inches in length from left neck site and down LUE to the elbow.  Plastic Surgery on-call paged to notify of extent of bleeding.  Await return call. Will continue to monitor.

## 2017-11-23 DIAGNOSIS — C4442 Squamous cell carcinoma of skin of scalp and neck: Secondary | ICD-10-CM

## 2017-11-23 LAB — PROTIME-INR
INR: 2.25
Prothrombin Time: 24.7 seconds — ABNORMAL HIGH (ref 11.4–15.2)

## 2017-11-23 LAB — GLUCOSE, CAPILLARY
GLUCOSE-CAPILLARY: 187 mg/dL — AB (ref 65–99)
Glucose-Capillary: 158 mg/dL — ABNORMAL HIGH (ref 65–99)
Glucose-Capillary: 106 mg/dL — ABNORMAL HIGH (ref 65–99)
Glucose-Capillary: 118 mg/dL — ABNORMAL HIGH (ref 65–99)
Glucose-Capillary: 174 mg/dL — ABNORMAL HIGH (ref 65–99)

## 2017-11-23 LAB — CBC
HCT: 27.6 % — ABNORMAL LOW (ref 39.0–52.0)
Hemoglobin: 8.9 g/dL — ABNORMAL LOW (ref 13.0–17.0)
MCH: 30.2 pg (ref 26.0–34.0)
MCHC: 32.2 g/dL (ref 30.0–36.0)
MCV: 93.6 fL (ref 78.0–100.0)
PLATELETS: 120 10*3/uL — AB (ref 150–400)
RBC: 2.95 MIL/uL — ABNORMAL LOW (ref 4.22–5.81)
RDW: 18.1 % — AB (ref 11.5–15.5)
WBC: 9.7 10*3/uL (ref 4.0–10.5)

## 2017-11-23 LAB — RENAL FUNCTION PANEL
ALBUMIN: 3.5 g/dL (ref 3.5–5.0)
ANION GAP: 14 (ref 5–15)
BUN: 39 mg/dL — ABNORMAL HIGH (ref 6–20)
CALCIUM: 8.4 mg/dL — AB (ref 8.9–10.3)
CO2: 24 mmol/L (ref 22–32)
Chloride: 91 mmol/L — ABNORMAL LOW (ref 101–111)
Creatinine, Ser: 5.73 mg/dL — ABNORMAL HIGH (ref 0.61–1.24)
GFR calc non Af Amer: 9 mL/min — ABNORMAL LOW (ref >60)
GFR, EST AFRICAN AMERICAN: 10 mL/min — AB (ref >60)
Glucose, Bld: 162 mg/dL — ABNORMAL HIGH (ref 65–99)
PHOSPHORUS: 5.4 mg/dL — AB (ref 2.5–4.6)
Potassium: 4.1 mmol/L (ref 3.5–5.1)
SODIUM: 129 mmol/L — AB (ref 135–145)

## 2017-11-23 LAB — LACTATE DEHYDROGENASE: LDH: 235 U/L — AB (ref 98–192)

## 2017-11-23 MED ORDER — LIDOCAINE HCL (PF) 2 % IJ SOLN
0.0000 mL | Freq: Once | INTRAMUSCULAR | Status: DC | PRN
  Filled 2017-11-23: qty 20

## 2017-11-23 MED ORDER — WARFARIN SODIUM 5 MG PO TABS
5.0000 mg | ORAL_TABLET | Freq: Once | ORAL | Status: AC
  Administered 2017-11-23: 5 mg via ORAL
  Filled 2017-11-23: qty 1

## 2017-11-23 NOTE — Plan of Care (Signed)
Patient continues to progress toward all goals of care.  INR improving towards goal for LVAD management at home; however, patient has an expanding hematoma from left neck, chin, ear, shoulder, chest and LUE from shoulder to elbow.  Left neck site bleeding decreased w/use of Surgicel. Continue to monitor patient progress.

## 2017-11-23 NOTE — Progress Notes (Signed)
Left neck dressing remains C/D/I at this time.  Surgicel effective in reducing bleeding from left neck site.  Patient rests well on right side.

## 2017-11-23 NOTE — Progress Notes (Addendum)
Patient ID: Trevor [name], male   DOB: September 01, 1946, 72 y.o.   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 Hgb 10.0-> 8.9  No CP or SOB.  Off heparin. INR 2.25  LVAD INTERROGATION:  HeartMate II LVAD:  Flow 5.8 liters/min, speed 9600, power 7.0, PI 3.1.  Occasional PI events.   Objective:    Vital Signs:   Temp:  [97.4 F (36.3 C)-97.7 F (36.5 C)] 97.4 F (36.3 C) (01/19 1937) Pulse Rate:  [55-81] 81 (01/19 2028) Resp:  [10-22] 10 (01/20 0347) BP: (81-116)/(51-84) 101/84 (01/19 2011) SpO2:  [94 %-100 %] 97 % (01/19 2028) Weight:  [83 kg (182 lb 15.7 oz)] 83 kg (182 lb 15.7 oz) (01/20 0634) Last BM Date: 11/19/17 Mean arterial Pressure 80-90  Intake/Output:  No intake or output data in the 24 hours ending 11/23/17 0815   Physical Exam    General:  Lying in bed fatigued .  HEENT: normal  Neck: supple. JVP not elevated.  Carotids 2+ bilat; no bruits. No lymphadenopathy or thryomegaly appreciated. Left neck dressing blood-soaked with extensive ecchymosis on neck into chest and left arm Cor: LVAD hum.  Lungs: Clear. Abdomen: obese soft, nontender, non-distended. No hepatosplenomegaly. No bruits or masses. Good bowel sounds. Driveline site clean. Anchor in place.  Extremities: no cyanosis, clubbing, rash. Warm 1+ edema  RLE dressing Neuro: alert & oriented x 3. No focal deficits. Moves all 4 without problem    Telemetry   Atrial fibrillation 70-80s +PVCs, Personally reviewed   Labs   Basic Metabolic Panel: Recent Labs  Lab 11/18/17 1137 11/19/17 0227 11/20/17 0240 11/22/17 0131 11/23/17 0652  NA 131* 131* 131* 132* 129*  K 4.5 4.6 3.6 4.1 4.1  CL 92* 93* 93* 94* 91*  CO2 25 24 26 25 24   GLUCOSE 241* 187* 243* 122* 162*  BUN 35* 47* 25* 21* 39*  CREATININE 5.51* 6.30* 4.32* 3.78* 5.73*  CALCIUM 8.7* 8.5* 8.3* 8.8* 8.4*    PHOS  --  6.3* 4.6  --  5.4*    Liver Function Tests: Recent Labs  Lab 11/18/17 1137 11/19/17 0227 11/20/17 0240 11/23/17 0652  AST 31  --   --   --   ALT 20  --   --   --   ALKPHOS 81  --   --   --   BILITOT 1.0  --   --   --   PROT 7.5  --   --   --   ALBUMIN 3.8 3.3* 3.3* 3.5   No results for input(s): LIPASE, AMYLASE in the last 168 hours. No results for input(s): AMMONIA in the last 168 hours.  CBC: Recent Labs  Lab 11/18/17 1137  11/20/17 0240 11/21/17 0307 11/22/17 0131 11/22/17 1330 11/23/17 0652  WBC 10.1   < > 8.8 13.4* 12.9* 10.8* 9.7  NEUTROABS 7.6  --   --   --   --   --   --   HGB 12.2*   < > 11.0* 10.6* 10.8* 10.0* 8.9*  HCT 38.0*   < > 34.8* 32.5* 33.3* 31.4* 27.6*  MCV 94.8   < > 93.5 92.6 92.8 95.4 93.6  PLT 110*   < > 115* 121* 130* 132* 120*   < > = values in this interval not displayed.  INR: Recent Labs  Lab 11/19/17 0227 11/20/17 0240 11/21/17 0307 11/22/17 0131 11/23/17 0652  INR 1.89 1.42 1.33 1.60 2.25    Other results:     Imaging   No results found.   Medications:     Scheduled Medications: . atorvastatin  40 mg Oral q1800  . budesonide  0.5 mg Inhalation BID  . busPIRone  5 mg Oral BID  . calcitRIOL  0.5 mcg Oral Q M,W,F-HD  . Chlorhexidine Gluconate Cloth  6 each Topical Once  . Chlorhexidine Gluconate Cloth  6 each Topical Once  . darbepoetin (ARANESP) injection - DIALYSIS  100 mcg Intravenous Q Wed-HD  . docusate sodium  100 mg Oral Daily  . ferric citrate  210 mg Oral With snacks  . ferric citrate  420 mg Oral TID WC  . gabapentin  300 mg Oral BID  . insulin aspart  0-15 Units Subcutaneous TID WC  . insulin aspart  0-5 Units Subcutaneous QHS  . insulin aspart  3 Units Subcutaneous TID WC  . levothyroxine  25 mcg Oral QAC breakfast  . multivitamin  1 tablet Oral QHS  . pantoprazole  40 mg Oral Daily  . sildenafil  40 mg Oral TID  . warfarin  5 mg Oral ONCE-1800  . Warfarin - Pharmacist Dosing  Inpatient   Does not apply q1800    Infusions: . sodium chloride 10 mL/hr at 11/20/17 0810    PRN Medications: acetaminophen, albuterol, guaiFENesin, ondansetron (ZOFRAN) IV, oxyCODONE, traMADol, traZODone   Patient Profile   Trevor [name] is a 72 y.o. male  history of CAD s/p CABG x 4 2010, OSA, AS with TAVR 2015, ESRD, DM2, paroxysmal atrial fibrillation, asthma, and ischemic cardiomyopathy with Medtronic ICD. 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 Post op period complicated by ARF leading to ESRD and need for chronic dialysis.   Admitted 11/18/17 for heparin bridging for Excision of SCC x 2.   Assessment/Plan:    1. 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 Continue pressure dressings.  Hgb down almost 2 g/dl from baseline Now off heparin. INR therapeutic. Will try to keep INR on low end of therapeutic. D/w PharmD.  - Will keep active T&S. Transfuse HGb < 8.0 - Appreciate surgical help.  2. Chronic systolic CHF: Ischemic cardiomyopathy, EF 20-25% with RV dysfunction on 2/18 echo pre-LVAD. 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 Medtronic ICD. Volume status looks stable on exam with HD. MAP stable today.  - Continue sildenafil 40 TID for RV failure - He is back on warfarin/heparin overlap with INR 1.6 Hgb stable at 10.8 this am  Can turn off heparin gtt and go home when INR > 1.8.   3. CAD: s/p CABG:  - No s/ of ischemia.    4. ESRD: - Discussed with Renal yesterday. Will hold HD until Monday am unless needs sooner. Stressed to Renal team that HD needs to be done at bedside with VAD trained personnel present.  5. Atrial fibrillation: Chronic. - INR 2.25  Heparin off  6. RV failure: Continue Revatio as above.  7. TDV:VOHY stable 80s  8. Anemia: Baseline anemia of renal disease/chronic disease but recent bleeding from colonic AVMs.  - Hgb down 10.0-> 8,9 9. 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 Off  ASA with goal INR 2-2.5.  - Continue octreotide as outpatient.  10. H/o TAVR:  Stable on most recent echo.  VAD interrogated personally. Parameters stable.   Length of Stay: [place], MD 11/23/2017, 8:15 AM  VAD Team --- VAD ISSUES ONLY--- Pager [phone] (7am - 7am)  Advanced Heart Failure Team  Pager [phone] (M-F; 7a - 4p)  Please contact [facility] for night-coverage after hours (4p -7a ) and weekends on [email or web address]

## 2017-11-23 NOTE — Plan of Care (Signed)
Continue current care plan 

## 2017-11-23 NOTE — Progress Notes (Addendum)
[place] for IV Heparin >>Coumadin Indication: LVAD  No Active Allergies  Patient Measurements: Height: 5\' 5"  (165.1 cm) Weight: 182 lb 15.7 oz (83 kg) IBW/kg (Calculated) : 61.5 Heparin Dosing Weight: 78 kg  Vital Signs: BP: 101/84 (01/19 2011) Pulse Rate: 81 (01/19 2028)  Labs: Recent Labs    11/21/17 0307 11/22/17 0131 11/22/17 1101 11/22/17 1330 11/23/17 0652  HGB 10.6* 10.8*  --  10.0* 8.9*  HCT 32.5* 33.3*  --  31.4* 27.6*  PLT 121* 130*  --  132* 120*  LABPROT 16.4* 18.9*  --   --  24.7*  INR 1.33 1.60  --   --  2.25  HEPARINUNFRC 0.53 0.87* 0.61  --   --   CREATININE  --  3.78*  --   --   --     Estimated Creatinine Clearance: 17.5 mL/min (A) (by C-G formula based on SCr of 3.78 mg/dL (H)).  Assessment: 72 yo male with LVAD, admitted for IV heparin bridge before dermatologic surgery 1/17.  PTA Coumadin dose 7.5 mg daily, last dose taken 1/13 per patient report.  Pt is now s/p excision of left neck and right leg, currently back on heparin/warfarin bridge.   INR is trending up to 2.2 this morning, patient received boosted dose x 3, INR will likely continue to trend up tomorrow, will back down on dose tonight then back to home dose tomorrow 1/21. Heparin orders cancelled. No overt bleeding noted, however hgb now down 10>8.9 and surgicel applied to excision sites overnight d/t consistent oozing.   Goal of Therapy:  INR goal 2-2.5 Heparin level 0.3-0.7 units/ml Monitor platelets by anticoagulation protocol: Yes   Plan:  Stop heparin Warfarin 5mg  tonight then back to home dose of 7.5mg  daily at discharge starting tomorrow Follow up in clinic set up for 1/24  Erin Hearing PharmD., BCPS Clinical Pharmacist 11/23/2017 7:42 AM

## 2017-11-23 NOTE — Progress Notes (Signed)
Subjective: Interval History: has complaints, uncomfortable with bleeding in neck and leg.  Objective: Vital signs in last 24 hours: Temp:  [97.4 F (36.3 C)-97.7 F (36.5 C)] 97.4 F (36.3 C) (01/19 1937) Pulse Rate:  [55-81] 81 (01/19 2028) Resp:  [10-22] 10 (01/20 0347) BP: (81-116)/(51-84) 101/84 (01/19 2011) SpO2:  [94 %-100 %] 97 % (01/19 2028) Weight:  [83 kg (182 lb 15.7 oz)] 83 kg (182 lb 15.7 oz) (01/20 0634) Weight change: -0.9 kg (-15.8 oz)  Intake/Output from previous day: No intake/output data recorded. Intake/Output this shift: No intake/output data recorded.  General appearance: alert, cooperative, no distress and pale Neck: hematoma L side of neck Resp: clear to auscultation bilaterally Chest wall: IJ PC Cardio: cont LVAD hum GI: pos bs, LVAD tubes RUQ, liver down 6 cm Extremities: AVF LUA hematoma better, dressing R calf  Lab Results: Recent Labs    11/22/17 0131 11/22/17 1330  WBC 12.9* 10.8*  HGB 10.8* 10.0*  HCT 33.3* 31.4*  PLT 130* 132*   BMET:  Recent Labs    11/22/17 0131  NA 132*  K 4.1  CL 94*  CO2 25  GLUCOSE 122*  BUN 21*  CREATININE 3.78*  CALCIUM 8.8*   No results for input(s): PTH in the last 72 hours. Iron Studies: No results for input(s): IRON, TIBC, TRANSFERRIN, FERRITIN in the last 72 hours.  Studies/Results: No results found.  I have reviewed the patient's current medications.  Assessment/Plan: 1 ESRD HD tomorrow. 2 Anemia on esa, follow 3 HPTH vit D 4 LVAD 5 Hematoma at neck incision per plastics 6 DM controlled P HD, esa, Plastics to see, vit D    LOS: 5 days   Jeneen Rinks [place] 11/23/2017,7:19 AM

## 2017-11-23 NOTE — Progress Notes (Signed)
3 Days Post-Op   Subjective/Chief Complaint: Bleeding from neck incision overnight. Now improved following application of Surgicel dressing. Heparin also discontinued and this has helped as well. INR 2.2 today.  He reports better pain control with Percocet.   Compressive dressing on neck removed and moderate bloody drainage on 4 x 4 but partially dried blood present. Scant drainage currently from the proximal incision. The skin appears ecchymotic, but viable.   The distal retention sutures removed as directed by [name] and attempted to gently open the distal incision, but the skin is healed. No oozing from the incision except for scant drainage on gauze from proximal incision line.  The right lower leg has been stable with less ooze since the [facility] dressing was removed.   Plans to transfuse in HD tomorrow per nursing.    Objective: Vital signs in last 24 hours: Temp:  [97.4 F (36.3 C)-97.7 F (36.5 C)] 97.4 F (36.3 C) (01/20 0839) Pulse Rate:  [55-81] 60 (01/20 0839) Resp:  [10-22] 20 (01/20 0839) BP: (74-116)/(51-84) 74/64 (01/20 0839) SpO2:  [90 %-99 %] 90 % (01/20 0839) Weight:  [83 kg (182 lb 15.7 oz)] 83 kg (182 lb 15.7 oz) (01/20 0634) Last BM Date: 11/19/17  Intake/Output from previous day: No intake/output data recorded. Intake/Output this shift: No intake/output data recorded.    Lab Results:  Recent Labs    11/22/17 1330 11/23/17 0652  WBC 10.8* 9.7  HGB 10.0* 8.9*  HCT 31.4* 27.6*  PLT 132* 120*   BMET Recent Labs    11/22/17 0131 11/23/17 0652  NA 132* 129*  K 4.1 4.1  CL 94* 91*  CO2 25 24  GLUCOSE 122* 162*  BUN 21* 39*  CREATININE 3.78* 5.73*  CALCIUM 8.8* 8.4*   PT/INR Recent Labs    11/22/17 0131 11/23/17 0652  LABPROT 18.9* 24.7*  INR 1.60 2.25   ABG No results for input(s): PHART, HCO3 in the last 72 hours.  Invalid input(s): PCO2, PO2  Studies/Results: No results found.  Anti-infectives: Anti-infectives (From  admission, onward)   Start     Dose/Rate Route Frequency Ordered Stop   11/20/17 0800  ceFAZolin (ANCEF) IVPB 2g/100 mL premix  Status:  Discontinued     2 g 200 mL/hr over 30 Minutes Intravenous To ShortStay Surgical 11/19/17 2312 11/20/17 1054   11/20/17 0600  ceFAZolin (ANCEF) IVPB 2g/100 mL premix  Status:  Discontinued     2 g 200 mL/hr over 30 Minutes Intravenous On call to O.R. 11/19/17 2312 11/19/17 2327      Assessment/Plan: s/p Procedure(s): EXCISION OF LEFT NECK AND RIGHT LEG SKIN CANCER WITH A CELL PLACEMENT (Left) APPLICATION OF A-CELL (Left) Continue to monitor neck and right lower leg areas closely.  Surgicel dressing reapplied over the neck area and 4x4's x 6 applied and compressive dressing as able.  Will continue to follow closely.    LOS: 5 days   Margo Lama,PA-C Plastic Surgery [phone]

## 2017-11-23 NOTE — Progress Notes (Signed)
Surgicel strips x 2 placed to left neck incision.  Covered w/gauze and medipore tape dressing.  Will continue to monitor.  Patient encouraged to lay on his right side.

## 2017-11-24 ENCOUNTER — Inpatient Hospital Stay (HOSPITAL_COMMUNITY): Payer: Medicare Other

## 2017-11-24 LAB — RENAL FUNCTION PANEL
ALBUMIN: 3.5 g/dL (ref 3.5–5.0)
Anion gap: 12 (ref 5–15)
BUN: 50 mg/dL — AB (ref 6–20)
CO2: 24 mmol/L (ref 22–32)
CREATININE: 6.51 mg/dL — AB (ref 0.61–1.24)
Calcium: 8.3 mg/dL — ABNORMAL LOW (ref 8.9–10.3)
Chloride: 91 mmol/L — ABNORMAL LOW (ref 101–111)
GFR calc Af Amer: 9 mL/min — ABNORMAL LOW (ref >60)
GFR, EST NON AFRICAN AMERICAN: 8 mL/min — AB (ref >60)
Glucose, Bld: 177 mg/dL — ABNORMAL HIGH (ref 65–99)
PHOSPHORUS: 5.4 mg/dL — AB (ref 2.5–4.6)
POTASSIUM: 4.7 mmol/L (ref 3.5–5.1)
Sodium: 127 mmol/L — ABNORMAL LOW (ref 135–145)

## 2017-11-24 LAB — PROTIME-INR
INR: 2.34
PROTHROMBIN TIME: 25.4 s — AB (ref 11.4–15.2)

## 2017-11-24 LAB — CBC WITH DIFFERENTIAL/PLATELET
Basophils Absolute: 0 10*3/uL (ref 0.0–0.1)
Basophils Relative: 0 %
EOS ABS: 0.3 10*3/uL (ref 0.0–0.7)
EOS PCT: 3 %
HCT: 26.6 % — ABNORMAL LOW (ref 39.0–52.0)
Hemoglobin: 8.7 g/dL — ABNORMAL LOW (ref 13.0–17.0)
LYMPHS ABS: 1.4 10*3/uL (ref 0.7–4.0)
Lymphocytes Relative: 14 %
MCH: 30.3 pg (ref 26.0–34.0)
MCHC: 32.7 g/dL (ref 30.0–36.0)
MCV: 92.7 fL (ref 78.0–100.0)
MONO ABS: 1 10*3/uL (ref 0.1–1.0)
MONOS PCT: 10 %
Neutro Abs: 7.4 10*3/uL (ref 1.7–7.7)
Neutrophils Relative %: 73 %
PLATELETS: 132 10*3/uL — AB (ref 150–400)
RBC: 2.87 MIL/uL — ABNORMAL LOW (ref 4.22–5.81)
RDW: 17.8 % — AB (ref 11.5–15.5)
WBC: 10.1 10*3/uL (ref 4.0–10.5)

## 2017-11-24 LAB — LACTATE DEHYDROGENASE: LDH: 250 U/L — ABNORMAL HIGH (ref 98–192)

## 2017-11-24 LAB — GLUCOSE, CAPILLARY
GLUCOSE-CAPILLARY: 79 mg/dL (ref 65–99)
GLUCOSE-CAPILLARY: 157 mg/dL — AB (ref 65–99)
GLUCOSE-CAPILLARY: 269 mg/dL — AB (ref 65–99)
Glucose-Capillary: 156 mg/dL — ABNORMAL HIGH (ref 65–99)
Glucose-Capillary: 166 mg/dL — ABNORMAL HIGH (ref 65–99)

## 2017-11-24 MED ORDER — FERRIC CITRATE 1 GM 210 MG(FE) PO TABS
210.0000 mg | ORAL_TABLET | ORAL | Status: DC | PRN
  Filled 2017-11-24: qty 1

## 2017-11-24 MED ORDER — LIDOCAINE-EPINEPHRINE 2 %-1:100000 IJ SOLN
20.0000 mL | Freq: Once | INTRAMUSCULAR | Status: AC
  Administered 2017-11-24: 20 mL via INTRADERMAL
  Filled 2017-11-24: qty 20

## 2017-11-24 MED ORDER — LIDOCAINE HCL (PF) 1 % IJ SOLN
INTRAMUSCULAR | Status: AC
  Filled 2017-11-24: qty 5

## 2017-11-24 MED ORDER — THROMBIN (RECOMBINANT) 5000 UNITS EX SOLR
CUTANEOUS | Status: AC
  Filled 2017-11-24: qty 5000

## 2017-11-24 MED ORDER — WARFARIN SODIUM 5 MG PO TABS: 5.0000 mg | ORAL_TABLET | Freq: Once | ORAL | Status: DC

## 2017-11-24 NOTE — Progress Notes (Addendum)
Patient ID: Trevor [name], male   DOB: 02-Mar-1946, 72 y.o.   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  Ecchymosis surrounding neck excision and down into left arm/upper chest.  Leg wound stable. He has blood in his mouth and says he has brought up blood with coughing.   No CP or SOB.  Off heparin. INR 2.3, LDH 250.   LVAD INTERROGATION:  HeartMate II LVAD:  Flow 6.1 liters/min, speed 9600, power 6.6, PI 3.4.  Multiple PI events.   Objective:    Vital Signs:   Temp:  [97.4 F (36.3 C)-98 F (36.7 C)] 97.9 F (36.6 C) (01/21 0457) Pulse Rate:  [60-65] 62 (01/20 1530) Resp:  [11-22] 11 (01/21 0457) BP: (74-106)/(52-91) 98/81 (01/21 0457) SpO2:  [90 %-96 %] 96 % (01/20 2029) Weight:  [187 lb 6.3 oz (85 kg)] 187 lb 6.3 oz (85 kg) (01/21 0457) Last BM Date: 11/19/17 Mean arterial Pressure 80s-90s  Intake/Output:   Intake/Output Summary (Last 24 hours) at 11/24/2017 0745 Last data filed at 11/23/2017 1300 Gross per 24 hour  Intake 600 ml  Output -  Net 600 ml     Physical Exam    GENERAL: Well appearing this am. NAD.  HEENT: Blood in mouth.  NECK: Supple, JVP difficult cm. Carotids OK.  CARDIAC:  Mechanical heart sounds with LVAD hum present.  LUNGS:  CTAB, normal effort.  ABDOMEN:  NT, ND, no HSM. No bruits or masses. +BS  LVAD exit site: Well-healed and incorporated. Dressing dry and intact. No erythema or drainage. Stabilization device present and accurately applied. Driveline dressing changed daily per sterile technique. EXTREMITIES:  Warm and dry. No cyanosis, clubbing, rash, or edema.  NEUROLOGIC:  Alert & oriented x 3. Cranial nerves grossly intact. Moves all 4 extremities w/o difficulty. Affect pleasant   SKIN: Ecchymosis neck, left upper arm, upper chest.    Telemetry   Atrial fibrillation 70s, Personally reviewed   Labs   Basic  Metabolic Panel: Recent Labs  Lab 11/19/17 0227 11/20/17 0240 11/22/17 0131 11/23/17 0652 11/24/17 0224  NA 131* 131* 132* 129* 127*  K 4.6 3.6 4.1 4.1 4.7  CL 93* 93* 94* 91* 91*  CO2 24 26 25 24 24   GLUCOSE 187* 243* 122* 162* 177*  BUN 47* 25* 21* 39* 50*  CREATININE 6.30* 4.32* 3.78* 5.73* 6.51*  CALCIUM 8.5* 8.3* 8.8* 8.4* 8.3*  PHOS 6.3* 4.6  --  5.4* 5.4*    Liver Function Tests: Recent Labs  Lab 11/18/17 1137 11/19/17 0227 11/20/17 0240 11/23/17 0652 11/24/17 0224  AST 31  --   --   --   --   ALT 20  --   --   --   --   ALKPHOS 81  --   --   --   --   BILITOT 1.0  --   --   --   --   PROT 7.5  --   --   --   --   ALBUMIN 3.8 3.3* 3.3* 3.5 3.5   No results for input(s): LIPASE, AMYLASE in the last 168 hours. No results for input(s): AMMONIA in the last 168 hours.  CBC: Recent Labs  Lab 11/18/17 1137  11/20/17 0240 11/21/17 0307 11/22/17 0131 11/22/17 1330 11/23/17 0652  WBC 10.1   < > 8.8 13.4* 12.9*  10.8* 9.7  NEUTROABS 7.6  --   --   --   --   --   --   HGB 12.2*   < > 11.0* 10.6* 10.8* 10.0* 8.9*  HCT 38.0*   < > 34.8* 32.5* 33.3* 31.4* 27.6*  MCV 94.8   < > 93.5 92.6 92.8 95.4 93.6  PLT 110*   < > 115* 121* 130* 132* 120*   < > = values in this interval not displayed.    INR: Recent Labs  Lab 11/20/17 0240 11/21/17 0307 11/22/17 0131 11/23/17 0652 11/24/17 0224  INR 1.42 1.33 1.60 2.25 2.34    Other results:     Imaging   No results found.   Medications:     Scheduled Medications: . atorvastatin  40 mg Oral q1800  . budesonide  0.5 mg Inhalation BID  . busPIRone  5 mg Oral BID  . calcitRIOL  0.5 mcg Oral Q M,W,F-HD  . Chlorhexidine Gluconate Cloth  6 each Topical Once  . Chlorhexidine Gluconate Cloth  6 each Topical Once  . darbepoetin (ARANESP) injection - DIALYSIS  100 mcg Intravenous Q Wed-HD  . docusate sodium  100 mg Oral Daily  . ferric citrate  420 mg Oral TID WC  . gabapentin  300 mg Oral BID  . insulin  aspart  0-15 Units Subcutaneous TID WC  . insulin aspart  0-5 Units Subcutaneous QHS  . insulin aspart  3 Units Subcutaneous TID WC  . levothyroxine  25 mcg Oral QAC breakfast  . multivitamin  1 tablet Oral QHS  . pantoprazole  40 mg Oral Daily  . sildenafil  40 mg Oral TID  . Warfarin - Pharmacist Dosing Inpatient   Does not apply q1800    Infusions: . sodium chloride 10 mL/hr at 11/20/17 0810    PRN Medications: acetaminophen, albuterol, ferric citrate, guaiFENesin, lidocaine, ondansetron (ZOFRAN) IV, oxyCODONE, traMADol, traZODone   Patient Profile   Trevor [name] is a 72 y.o. male  history of CAD s/p CABG x 4 2010, OSA, AS with TAVR 2015, ESRD, DM2, paroxysmal atrial fibrillation, asthma, and ischemic cardiomyopathy with Medtronic ICD. 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 Post op period complicated by ARF leading to ESRD and need for chronic dialysis.   Admitted 11/18/17 for heparin bridging for Excision of SCC x 2.   Assessment/Plan:    1. 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  He has extensive ecchymosis around neck excision site into chest and left arm.  Pending hemoglobin today, was down yesterday. Has blood in mouth and coughing up blood.  - Continue pressure dressing.  - Keep INR on the low end of goal.   - Will keep active T&S. Transfuse HGb < 8.0 - Need portable CXR, not sure where blood in mouth is coming from.  - Surgery to re-evaluate today.  2. Chronic systolic CHF: Ischemic cardiomyopathy, EF 20-25% with RV dysfunction on 2/18 echo pre-LVAD. 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 Medtronic ICD. Volume status looks stable on exam with HD. MAP stable today.  - Continue sildenafil 40 TID for RV failure - warfarin goal 2-2.5.  3. CAD: s/p CABG.  No evidence for ischemia.     4. ESRD: HD needs to be done at bedside with VAD trained personnel present. Needs HD today.  5. Atrial fibrillation: Chronic. - INR 2.3, heparin off.  Run at low end of therapeutic with  bleeding.  6. RV failure: Continue Revatio as above.  7. STM:HDQQ stable 80s  8. Anemia: Baseline anemia of renal disease/chronic disease but recent bleeding from colonic AVMs.  - Hgb down 10.0-> 8.9 yesterday with surgical site bleeding, pending INR today.  Transfuse < 8.  9. 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 Off ASA with goal INR 2-2.5.  - Continue octreotide as outpatient.  10. H/o TAVR:  Stable on most recent echo.   VAD interrogated personally. Parameters stable.  Length of Stay: 6  Loralie Champagne, MD 11/24/2017, 7:45 AM  VAD Team --- VAD ISSUES ONLY--- Pager [phone] (7am - 7am)  Advanced Heart Failure Team  Pager [phone] (M-F; 7a - 4p)  Please contact [facility] for night-coverage after hours (4p -7a ) and weekends on [email or web address]  Hemoglobin fairly stable at 8.7, no transfusion.   Loralie Champagne 11/24/2017 9:17 AM

## 2017-11-24 NOTE — Progress Notes (Signed)
[facility] Progress Note   Subjective:   Patient seen while cannulated on HD.  Complains of coughing up blood clots about the size of his thumb nail 3x today.    Objective Vitals:   11/24/17 1430 11/24/17 1500 11/24/17 1530 11/24/17 1600  BP: (!) 101/45 (!) 87/60 (!) 78/31 (!) 77/56  Pulse: 78 67 76 77  Resp: 18 18 18 19   Temp:      TempSrc:      SpO2:      Weight:      Height:       Physical Exam General:NAD, pleasant male, tired appearing Neck: swelling, pressure bandage in place Chest wall: bruising over upper left chest, IJ PC in place. Heart:mechanical heart sounds, LVAD hum Lungs: mostly CTAB, wheezing on left Abdomen:soft, NTND Extremities:no LE edema, bandage on left calf Dialysis Access: LU AVF +b/t   Filed Weights   11/23/17 0634 11/24/17 0457 11/24/17 1315  Weight: 83 kg (182 lb 15.7 oz) 85 kg (187 lb 6.3 oz) 85.2 kg (187 lb 13.3 oz)   No intake or output data in the 24 hours ending 11/24/17 1633  Additional Objective Labs: Basic Metabolic Panel: Recent Labs  Lab 11/20/17 0240 11/22/17 0131 11/23/17 0652 11/24/17 0224  NA 131* 132* 129* 127*  K 3.6 4.1 4.1 4.7  CL 93* 94* 91* 91*  CO2 26 25 24 24   GLUCOSE 243* 122* 162* 177*  BUN 25* 21* 39* 50*  CREATININE 4.32* 3.78* 5.73* 6.51*  CALCIUM 8.3* 8.8* 8.4* 8.3*  PHOS 4.6  --  5.4* 5.4*   Liver Function Tests: Recent Labs  Lab 11/18/17 1137  11/20/17 0240 11/23/17 0652 11/24/17 0224  AST 31  --   --   --   --   ALT 20  --   --   --   --   ALKPHOS 81  --   --   --   --   BILITOT 1.0  --   --   --   --   PROT 7.5  --   --   --   --   ALBUMIN 3.8   < > 3.3* 3.5 3.5   < > = values in this interval not displayed.   No results for input(s): LIPASE, AMYLASE in the last 168 hours. CBC: Recent Labs  Lab 11/18/17 1137  11/21/17 0307 11/22/17 0131 11/22/17 1330 11/23/17 0652 11/24/17 0800  WBC 10.1   < > 13.4* 12.9* 10.8* 9.7 10.1  NEUTROABS 7.6  --   --   --   --   --  7.4   HGB 12.2*   < > 10.6* 10.8* 10.0* 8.9* 8.7*  HCT 38.0*   < > 32.5* 33.3* 31.4* 27.6* 26.6*  MCV 94.8   < > 92.6 92.8 95.4 93.6 92.7  PLT 110*   < > 121* 130* 132* 120* 132*   < > = values in this interval not displayed.    CBG: Recent Labs  Lab 11/23/17 1640 11/23/17 2110 11/24/17 0847 11/24/17 0859 11/24/17 1228  GLUCAP 174* 187* 156* 166* 79    Lab Results  Component Value Date   INR 2.34 11/24/2017   INR 2.25 11/23/2017   INR 1.60 11/22/2017   Studies/Results: Dg Chest Port 1 View  Result Date: 11/24/2017 CLINICAL DATA:  Shortness of breath.  Hemoptysis. EXAM: PORTABLE CHEST 1 VIEW COMPARISON:  11/06/2017 and 09/08/2017 FINDINGS: Chronic cardiomegaly with left ventricular assist device, tricuspid and aortic valve replacements, double  lumen central catheter and AICD in place. The pulmonary vascularity is normal and the lungs are clear. No pneumothorax. Chronic thoracolumbar scoliosis. IMPRESSION: No acute abnormalities.  No change since the prior exam. Electronically Signed   By: Lorriane Shire M.D.   On: 11/24/2017 08:54    Medications: . sodium chloride 10 mL/hr at 11/20/17 0810   . atorvastatin  40 mg Oral q1800  . budesonide  0.5 mg Inhalation BID  . busPIRone  5 mg Oral BID  . calcitRIOL  0.5 mcg Oral Q M,W,F-HD  . Chlorhexidine Gluconate Cloth  6 each Topical Once  . Chlorhexidine Gluconate Cloth  6 each Topical Once  . darbepoetin (ARANESP) injection - DIALYSIS  100 mcg Intravenous Q Wed-HD  . docusate sodium  100 mg Oral Daily  . ferric citrate  420 mg Oral TID WC  . gabapentin  300 mg Oral BID  . insulin aspart  0-15 Units Subcutaneous TID WC  . insulin aspart  0-5 Units Subcutaneous QHS  . insulin aspart  3 Units Subcutaneous TID WC  . levothyroxine  25 mcg Oral QAC breakfast  . lidocaine (PF)      . multivitamin  1 tablet Oral QHS  . pantoprazole  40 mg Oral Daily  . sildenafil  40 mg Oral TID  . Warfarin - Pharmacist Dosing Inpatient   Does not apply  q1800    Dialysis Orders: MWFS - [facility]  4.5 hrs, BFR 400, DFR 800,  EDW 82kg, 2K/ 2Ca,  Access: LU AVF, TDC  Heparin 5800 Unit bolus Mircera 200 mcg IV q2wks, last on 11/05/17 venofer 50mg  IV qwk, last 11/12/17 Calcitriol 0.66mcg PO qHD TID  Last Labs: 11/12/17 Hgb 12.2, TSAT 23%, K 4.4, Ca 8.9, P 6.7, PTH 245, Albumin 4.2  Assessment/Plan: 1. SCC L neck and RLE- excision by [name] on 1/17, heparin bridging, brusing in neck/chest. Bleeding from neck incision overnight, pressure dressing applied. Surgery consulting. Per primary. 2. Hemoptysis - new onset. CXR unremarkable. INR in goal. Per primary. 3. ESRD -  seen on HD today. Continue per regular MWFS schedule. K 4.7 4. Hypertension/volume  - BP soft, monitor. 3kg over edw today. Continue to titrate down volume as tolerated.  5. Anemia  - Hgb trending down, continue to monitor. Plan to transfuse if Hgb <8.  Aranesp 175mcg qwk (Wed) started on 1/16. 6. Secondary Hyperparathyroidism -  Ca 8.3, using 2.5K bath today. P 5.4. Continue binders, VDRA. 7. Nutrition - Alb 3.5. Renal/Carb modified diet w/Fluid restriction. Renavite. 8. Chronic systolic/diastolic CHF: ischemic cardiomyopathy, EF 20-25% w/LVAD, Medtronic ICD. - on coumadin - per cardio/LVAD team 9. CAD s/p CABG - stable, per cardio 10. A fib - on coumadin - per cardio 10. H/o GIB - per primary 11. H/o TAVR - stable, per primary 12. DM - stable, per primary     Jen Mow, PA-C [facility] Pager: [phone] 11/24/2017,4:33 PM  LOS: 6 days   Pt seen, examined and agree w A/P as above.  Kelly Splinter MD [employer] pager [phone]   11/24/2017, 5:30 PM

## 2017-11-24 NOTE — Progress Notes (Signed)
LVAD Coordinator Rounding Note:  Admitted1/15/2019 for scheduled inpatient procedure/heparin bridging. 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 Darcey Nora under Destination Therapy criteria.  Patient c/o of "alarm" from ICD. Medtronic rep called to assess. Will follow for report.  Vital signs: HR:81AFib Doppler Pressure: 98 Automatic BP:121/75 (85) O2 Sat: 95% on RA Wt in lbs:179>177>182>187 lbs  LVAD interrogation reveals:  Speed:9600 Flow:6.6 Power: 6.6w PI:3.4 Alarms: none Events:>100 PI events on 1/21 Fixed speed: 9600 Low speed limit: 9000  Drive Line: Sorbaview dressing dry and intact;Anchor intact. Weekly dressing changes to be done by VAD Coordinator. Next change: 11/27/16   Labs:  INR trend:1.90>1.89>1.42>1.33>2.34  Anticoagulation Plan: -INR Goal: 2.0 - 2.5.   Device: -Medtronic single lead -Therapies: on - last check: today at bedside by Med[employer] Rep  Plan/Recommendations:   1.Please page VAD coordinator for equipment issues or patient concerns 2. VAD Coordinator will accompany patient to HD today.  Balinda Quails RN, VAD Coordinator 24/7 pager [phone]

## 2017-11-24 NOTE — Progress Notes (Addendum)
Subjective: Patient seen this morning.  Very cheerful and stable.  Had not been to dialysis yet.  Complained of mild tightness in his left neck.  Concerned about coughing up blood.  Objective: Vital signs in last 24 hours: Temp:  [97.3 F (36.3 C)-98.1 F (36.7 C)] 98 F (36.7 C) (01/21 1848) Pulse Rate:  [50-83] 75 (01/21 1730) Resp:  [11-20] 18 (01/21 1730) BP: (77-121)/(31-91) 84/33 (01/21 1730) SpO2:  [93 %-98 %] 98 % (01/21 1848) Weight:  [82.7 kg (182 lb 5.1 oz)-85.2 kg (187 lb 13.3 oz)] 82.7 kg (182 lb 5.1 oz) (01/21 1805) Weight change: 2 kg (4 lb 6.6 oz) Last BM Date: 11/19/17  Intake/Output from previous day: 01/20 0701 - 01/21 0700 In: 600 [P.O.:600] Out: -  Intake/Output this shift: No intake/output data recorded.  General appearance: alert, cooperative and no distress Incision/Wound:  Tightness in the neck from hematoma.  Anticoagulation improving.  Heparin off which is helpful.  Bruising as expected.    Lab Results: Recent Labs    11/23/17 0652 11/24/17 0800  WBC 9.7 10.1  HGB 8.9* 8.7*  HCT 27.6* 26.6*  PLT 120* 132*   BMET Recent Labs    11/23/17 0652 11/24/17 0224  NA 129* 127*  K 4.1 4.7  CL 91* 91*  CO2 24 24  GLUCOSE 162* 177*  BUN 39* 50*  CREATININE 5.73* 6.51*  CALCIUM 8.4* 8.3*    Studies/Results: Dg Chest Port 1 View  Result Date: 11/24/2017 CLINICAL DATA:  Shortness of breath.  Hemoptysis. EXAM: PORTABLE CHEST 1 VIEW COMPARISON:  11/06/2017 and 09/08/2017 FINDINGS: Chronic cardiomegaly with left ventricular assist device, tricuspid and aortic valve replacements, double lumen central catheter and AICD in place. The pulmonary vascularity is normal and the lungs are clear. No pneumothorax. Chronic thoracolumbar scoliosis. IMPRESSION: No acute abnormalities.  No change since the prior exam. Electronically Signed   By: Lorriane Shire M.D.   On: 11/24/2017 08:54    Medications: I have reviewed the patient's current  medications.  Assessment/Plan: One inferior stitch removed with a sterile technique.  There was immediate evacuation of the blood which was not clotted.  There was great improvement in the site.  Patient noted release of pressure.  Thrombin solution placed in space with a thrombin sheet as a drain to prevent re-accumulation.  Will continue to monitor. No sign of infection. Concerned about blood he is coughing up.  Not likely from the [facility] excision as there margins was to muscle.  Path back and will discuss with patient tomorrow.   LOS: 6 days    Wallace Going 11/24/2017

## 2017-11-24 NOTE — Progress Notes (Signed)
Accompanied patient to HD treatment today. All emergency back up equipment brought to HD unit.  Jebadiah Imperato RN, VAD Coordinator 24/7 pager [phone]  

## 2017-11-24 NOTE — Progress Notes (Addendum)
[facility] for IV Heparin >>Coumadin Indication: LVAD  No Active Allergies  Patient Measurements: Height: 5\' 5"  (165.1 cm) Weight: 187 lb 6.3 oz (85 kg) IBW/kg (Calculated) : 61.5 Heparin Dosing Weight: 78 kg  Vital Signs: Temp: 97.3 F (36.3 C) (01/21 0745) Temp Source: Oral (01/21 0745) BP: 121/75 (01/21 0819) Pulse Rate: 80 (01/21 0819)  Labs: Recent Labs    11/22/17 0131 11/22/17 1101 11/22/17 1330 11/23/17 0652 11/24/17 0224 11/24/17 0800  HGB 10.8*  --  10.0* 8.9*  --  8.7*  HCT 33.3*  --  31.4* 27.6*  --  26.6*  PLT 130*  --  132* 120*  --  132*  LABPROT 18.9*  --   --  24.7* 25.4*  --   INR 1.60  --   --  2.25 2.34  --   HEPARINUNFRC 0.87* 0.61  --   --   --   --   CREATININE 3.78*  --   --  5.73* 6.51*  --     Estimated Creatinine Clearance: 10.3 mL/min (A) (by C-G formula based on SCr of 6.51 mg/dL (H)).  Assessment: 72 yo male with LVAD, admitted for IV heparin bridge before dermatologic surgery 1/17.  PTA Coumadin dose 7.5 mg daily, last dose taken 1/13 per patient report.  Pt is now s/p excision of left neck and right leg > with neck hematoma 1/21> drained in room by plastic surgeon.   Keep INR on low end range.   INR 2.2 this morning, patient received boosted dose x 3, INR will likely continue to be stable. hgb now down 10>8.9 but stable today.  Goal of Therapy:  INR goal 2-2.5 Monitor platelets by anticoagulation protocol: Yes   Plan:  Warfarin 5mg  tonight - repeat Daily INR  [name] Pharm.D. CPP, BCPS Clinical Pharmacist [phone] 11/24/2017 12:09 PM    PM addendum Still oozing from neck wound > will hold warfarin tonight in case needs more procedures with bleeding   [name] Pharm.D. CPP, BCPS Clinical Pharmacist [phone] 11/24/2017 3:30 PM

## 2017-11-24 NOTE — Care Management Note (Signed)
Case Management Note  Patient Details  Name: Trevor [name] MRN: [id number] Date of Birth: 1946/10/20  Subjective/Objective:   Pt is now s/p excision of squamous cell CA from  Neck and leg                 Action/Plan:   PTA from home independent.  Pt has hx of LVAD implantation 12/2016.  CM will continue to follow for discharge needs   Expected Discharge Date:  11/21/17               Expected Discharge Plan:  Home/Self Care  In-House Referral:     Discharge planning Services  CM Consult  Post Acute Care Choice:    Choice offered to:     DME Arranged:    DME Agency:     HH Arranged:    HH Agency:     Status of Service:     If discussed at [employer], dates discussed:    Additional Comments:  [name], RN 11/24/2017, 10:25 AM

## 2017-11-25 ENCOUNTER — Ambulatory Visit: Payer: Self-pay | Admitting: Plastic Surgery

## 2017-11-25 ENCOUNTER — Inpatient Hospital Stay (HOSPITAL_COMMUNITY): Payer: Medicare Other

## 2017-11-25 DIAGNOSIS — R042 Hemoptysis: Secondary | ICD-10-CM

## 2017-11-25 DIAGNOSIS — D62 Acute posthemorrhagic anemia: Secondary | ICD-10-CM

## 2017-11-25 LAB — TYPE AND SCREEN
ABO/RH(D): A NEG
Antibody Screen: NEGATIVE

## 2017-11-25 LAB — RENAL FUNCTION PANEL
ALBUMIN: 3.4 g/dL — AB (ref 3.5–5.0)
Anion gap: 11 (ref 5–15)
BUN: 27 mg/dL — AB (ref 6–20)
CALCIUM: 8.3 mg/dL — AB (ref 8.9–10.3)
CO2: 25 mmol/L (ref 22–32)
CREATININE: 4.28 mg/dL — AB (ref 0.61–1.24)
Chloride: 94 mmol/L — ABNORMAL LOW (ref 101–111)
GFR calc Af Amer: 15 mL/min — ABNORMAL LOW (ref >60)
GFR calc non Af Amer: 13 mL/min — ABNORMAL LOW (ref >60)
Glucose, Bld: 185 mg/dL — ABNORMAL HIGH (ref 65–99)
PHOSPHORUS: 3.8 mg/dL (ref 2.5–4.6)
POTASSIUM: 4.5 mmol/L (ref 3.5–5.1)
SODIUM: 130 mmol/L — AB (ref 135–145)

## 2017-11-25 LAB — GLUCOSE, CAPILLARY
GLUCOSE-CAPILLARY: 179 mg/dL — AB (ref 65–99)
GLUCOSE-CAPILLARY: 106 mg/dL — AB (ref 65–99)
Glucose-Capillary: 195 mg/dL — ABNORMAL HIGH (ref 65–99)
Glucose-Capillary: 131 mg/dL — ABNORMAL HIGH (ref 65–99)

## 2017-11-25 LAB — CBC
HCT: 24.3 % — ABNORMAL LOW (ref 39.0–52.0)
HEMATOCRIT: 25.1 % — AB (ref 39.0–52.0)
HEMOGLOBIN: 8.2 g/dL — AB (ref 13.0–17.0)
HEMOGLOBIN: 7.8 g/dL — AB (ref 13.0–17.0)
MCH: 30.8 pg (ref 26.0–34.0)
MCH: 30.2 pg (ref 26.0–34.0)
MCHC: 32.7 g/dL (ref 30.0–36.0)
MCHC: 32.1 g/dL (ref 30.0–36.0)
MCV: 94.4 fL (ref 78.0–100.0)
MCV: 94.2 fL (ref 78.0–100.0)
Platelets: 139 10*3/uL — ABNORMAL LOW (ref 150–400)
Platelets: 134 10*3/uL — ABNORMAL LOW (ref 150–400)
RBC: 2.66 MIL/uL — ABNORMAL LOW (ref 4.22–5.81)
RBC: 2.58 MIL/uL — AB (ref 4.22–5.81)
RDW: 18.3 % — ABNORMAL HIGH (ref 11.5–15.5)
RDW: 18.1 % — ABNORMAL HIGH (ref 11.5–15.5)
WBC: 10.4 10*3/uL (ref 4.0–10.5)
WBC: 10.7 10*3/uL — ABNORMAL HIGH (ref 4.0–10.5)

## 2017-11-25 LAB — CBC WITH DIFFERENTIAL/PLATELET
BASOS ABS: 0 10*3/uL (ref 0.0–0.1)
Basophils Relative: 0 %
EOS ABS: 0.2 10*3/uL (ref 0.0–0.7)
EOS PCT: 2 %
HCT: 27 % — ABNORMAL LOW (ref 39.0–52.0)
Hemoglobin: 8.6 g/dL — ABNORMAL LOW (ref 13.0–17.0)
LYMPHS PCT: 9 %
Lymphs Abs: 0.9 10*3/uL (ref 0.7–4.0)
MCH: 30.1 pg (ref 26.0–34.0)
MCHC: 31.9 g/dL (ref 30.0–36.0)
MCV: 94.4 fL (ref 78.0–100.0)
MONO ABS: 0.9 10*3/uL (ref 0.1–1.0)
Monocytes Relative: 9 %
Neutro Abs: 7.5 10*3/uL (ref 1.7–7.7)
Neutrophils Relative %: 80 %
PLATELETS: 138 10*3/uL — AB (ref 150–400)
RBC: 2.86 MIL/uL — AB (ref 4.22–5.81)
RDW: 18 % — AB (ref 11.5–15.5)
WBC: 9.4 10*3/uL (ref 4.0–10.5)

## 2017-11-25 LAB — LACTATE DEHYDROGENASE: LDH: 256 U/L — ABNORMAL HIGH (ref 98–192)

## 2017-11-25 LAB — PROTIME-INR
INR: 1.81
INR: 1.56
PROTHROMBIN TIME: 20.9 s — AB (ref 11.4–15.2)
Prothrombin Time: 18.6 seconds — ABNORMAL HIGH (ref 11.4–15.2)

## 2017-11-25 MED ORDER — LIDOCAINE HCL (PF) 1 % IJ SOLN
INTRAMUSCULAR | Status: AC
  Filled 2017-11-25: qty 30

## 2017-11-25 MED ORDER — AMOXICILLIN-POT CLAVULANATE 500-125 MG PO TABS
1.0000 | ORAL_TABLET | Freq: Two times a day (BID) | ORAL | Status: DC
  Filled 2017-11-25: qty 1

## 2017-11-25 MED ORDER — THROMBIN (RECOMBINANT) 5000 UNITS EX SOLR
5000.0000 [IU] | Freq: Once | CUTANEOUS | Status: DC
  Filled 2017-11-25: qty 5000

## 2017-11-25 MED ORDER — LIDOCAINE-EPINEPHRINE 1 %-1:100000 IJ SOLN
20.0000 mL | Freq: Once | INTRAMUSCULAR | Status: DC
  Filled 2017-11-25: qty 20

## 2017-11-25 MED ORDER — AMOXICILLIN-POT CLAVULANATE 500-125 MG PO TABS
1.0000 | ORAL_TABLET | ORAL | Status: AC
Start: 2017-11-25 — End: 2017-12-01
  Administered 2017-11-25 – 2017-12-01 (×7): 500 mg via ORAL
  Filled 2017-11-25 (×7): qty 1

## 2017-11-25 MED ORDER — IOPAMIDOL (ISOVUE-300) INJECTION 61%
INTRAVENOUS | Status: AC
  Administered 2017-11-25: 75 mL via INTRAVENOUS
  Filled 2017-11-25: qty 50

## 2017-11-25 MED ORDER — THROMBIN (RECOMBINANT) 5000 UNITS EX SOLR: 5000.0000 [IU] | Freq: Once | CUTANEOUS | Status: DC

## 2017-11-25 MED ORDER — AMOXICILLIN-POT CLAVULANATE 875-125 MG PO TABS
1.0000 | ORAL_TABLET | Freq: Two times a day (BID) | ORAL | Status: DC
  Filled 2017-11-25: qty 1

## 2017-11-25 MED ORDER — LIDOCAINE HCL (PF) 1 % IJ SOLN
INTRAMUSCULAR | Status: AC
  Filled 2017-11-25: qty 5

## 2017-11-25 MED ORDER — IOPAMIDOL (ISOVUE-300) INJECTION 61%
INTRAVENOUS | Status: AC
  Administered 2017-11-25: 50 mL via INTRAVENOUS
  Filled 2017-11-25: qty 75

## 2017-11-25 MED ORDER — LIDOCAINE HCL (PF) 1 % IJ SOLN
INTRAMUSCULAR | Status: AC
  Administered 2017-11-25: 30 mL
  Filled 2017-11-25: qty 30

## 2017-11-25 MED ORDER — WARFARIN SODIUM 5 MG PO TABS: 5.0000 mg | ORAL_TABLET | Freq: Once | ORAL | Status: DC

## 2017-11-25 NOTE — Progress Notes (Signed)
[facility] Progress Note   Subjective:   Patient seen in room.  No new c/o.    Objective Vitals:   11/25/17 0356 11/25/17 0823 11/25/17 0849 11/25/17 1210  BP: 95/73 (!) 83/72  (!) 121/110  Pulse: (!) 55 (!) 46  78  Resp: 17 13  13   Temp: (!) 97.5 F (36.4 C) 98.1 F (36.7 C)  98 F (36.7 C)  TempSrc: Oral Oral  Oral  SpO2: 97% 99% 99% 97%  Weight: 83.1 kg (183 lb 3.2 oz)     Height:       Physical Exam General:NAD, pleasant male, tired appearing Neck: swelling, pressure bandage in place Chest wall: bruising over upper left chest, IJ PC in place. Heart:mechanical heart sounds, LVAD hum Lungs: mostly CTA bilat Abdomen:soft, NTND Extremities:no LE edema, bandage on R calf Dialysis Access: LU AVF +b/t   Filed Weights   11/24/17 1315 11/24/17 1805 11/25/17 0356  Weight: 85.2 kg (187 lb 13.3 oz) 82.7 kg (182 lb 5.1 oz) 83.1 kg (183 lb 3.2 oz)    Intake/Output Summary (Last 24 hours) at 11/25/2017 1223 Last data filed at 11/25/2017 0400 Gross per 24 hour  Intake 1060 ml  Output 2000 ml  Net -940 ml    Additional Objective Labs: Basic Metabolic Panel: Recent Labs  Lab 11/23/17 0652 11/24/17 0224 11/25/17 0345  NA 129* 127* 130*  K 4.1 4.7 4.5  CL 91* 91* 94*  CO2 24 24 25   GLUCOSE 162* 177* 185*  BUN 39* 50* 27*  CREATININE 5.73* 6.51* 4.28*  CALCIUM 8.4* 8.3* 8.3*  PHOS 5.4* 5.4* 3.8   Liver Function Tests: Recent Labs  Lab 11/23/17 0652 11/24/17 0224 11/25/17 0345  ALBUMIN 3.5 3.5 3.4*   No results for input(s): LIPASE, AMYLASE in the last 168 hours. CBC: Recent Labs  Lab 11/22/17 0131 11/22/17 1330 11/23/17 0652 11/24/17 0800 11/25/17 0345  WBC 12.9* 10.8* 9.7 10.1 9.4  NEUTROABS  --   --   --  7.4 7.5  HGB 10.8* 10.0* 8.9* 8.7* 8.6*  HCT 33.3* 31.4* 27.6* 26.6* 27.0*  MCV 92.8 95.4 93.6 92.7 94.4  PLT 130* 132* 120* 132* 138*    CBG: Recent Labs  Lab 11/24/17 1228 11/24/17 1855 11/24/17 2118 11/25/17 0826  11/25/17 1206  GLUCAP 79 157* 269* 179* 195*    Lab Results  Component Value Date   INR 1.81 11/25/2017   INR 2.34 11/24/2017   INR 2.25 11/23/2017   Studies/Results: Dg Chest Port 1 View  Result Date: 11/24/2017 CLINICAL DATA:  Shortness of breath.  Hemoptysis. EXAM: PORTABLE CHEST 1 VIEW COMPARISON:  11/06/2017 and 09/08/2017 FINDINGS: Chronic cardiomegaly with left ventricular assist device, tricuspid and aortic valve replacements, double lumen central catheter and AICD in place. The pulmonary vascularity is normal and the lungs are clear. No pneumothorax. Chronic thoracolumbar scoliosis. IMPRESSION: No acute abnormalities.  No change since the prior exam. Electronically Signed   By: Lorriane Shire M.D.   On: 11/24/2017 08:54    Medications: . sodium chloride 10 mL/hr at 11/20/17 0810   . amoxicillin-clavulanate  1 tablet Oral Q24H  . atorvastatin  40 mg Oral q1800  . budesonide  0.5 mg Inhalation BID  . busPIRone  5 mg Oral BID  . calcitRIOL  0.5 mcg Oral Q M,W,F-HD  . Chlorhexidine Gluconate Cloth  6 each Topical Once  . Chlorhexidine Gluconate Cloth  6 each Topical Once  . darbepoetin (ARANESP) injection - DIALYSIS  100 mcg Intravenous  Q Wed-HD  . docusate sodium  100 mg Oral Daily  . ferric citrate  420 mg Oral TID WC  . gabapentin  300 mg Oral BID  . insulin aspart  0-15 Units Subcutaneous TID WC  . insulin aspart  0-5 Units Subcutaneous QHS  . insulin aspart  3 Units Subcutaneous TID WC  . levothyroxine  25 mcg Oral QAC breakfast  . lidocaine (PF)      . multivitamin  1 tablet Oral QHS  . pantoprazole  40 mg Oral Daily  . sildenafil  40 mg Oral TID  . thrombin recombinant  5,000 Units Topical Once  . warfarin  5 mg Oral ONCE-1800  . Warfarin - Pharmacist Dosing Inpatient   Does not apply q1800    Dialysis Orders: MWFS - [facility] 4.5h   82kg   2K/ 2Ca   LU AVF/ TDC   Hep 5800 Mircera 200 mcg IV q2wks, last on 11/05/17 venofer 50mg  IV qwk, last 11/12/17 Calcitriol  0.2mcg PO qHD TID  Last Labs: 11/12/17 Hgb 12.2, TSAT 23%, K 4.4, Ca 8.9, P 6.7, PTH 245, Albumin 4.2  Assessment: 1. SCCa L neck and RLE- excision by [name] on 1/17 2. ESRD -  continue per regular MWFS schedule. K ok. HOLD heparin for now with wound bleeding issues.  3. Hypertension/volume  - BP soft at baseline, close to dry 4. Anemia  - Hgb trending down, continue to monitor. Plan to transfuse if Hgb <8.  Aranesp 178mcg qwk (Wed) started on 1/16. 5. Secondary Hyperparathyroidism -  Ca 8.3, using 2.5K bath today. P 5.4. Continue binders, VDRA. 6. Nutrition - Alb 3.5. Renal/Carb modified diet w/Fluid restriction. Renavite. 7. Chronic systolic/diastolic CHF: ischemic cardiomyopathy, EF 20-25% w/LVAD, [employer] ICD. - on coumadin - per cardio/LVAD team 8. CAD s/p CABG - stable, per cardio 9. A fib - on coumadin - per cardio 10. H/o GIB - per primary 11. H/o TAVR - stable, per primary 12. DM - stable, per primary  P - MWFS HD, esa   Kelly Splinter MD [place] pager [phone]   11/25/2017, 12:23 PM

## 2017-11-25 NOTE — Progress Notes (Signed)
LVAD Coordinator Rounding Note:  Admitted1/15/2019 for scheduled inpatient procedure/heparin bridging. 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 Darcey Nora under Destination Therapy criteria.  Vital signs: HR:87AFib Doppler Pressure: 68 Automatic BP:83/72 (79) O2 Sat: 99% on RA Wt in lbs:179>177>182>187>183lbs  LVAD interrogation reveals:  Speed:9600 Flow:6.2 Power: 6.6w PI:3.2 Alarms: none Events:>100 PI events on 1/22 Fixed speed: 9600 Low speed limit: 9000  Drive Line: Sorbaview dressing dry and intact;Anchor intact.Weekly dressing changes to be done by VAD Coordinator. Next change: 11/27/16  Infection: Augmentin started 11/25/17 for neck wound  Labs:  INR trend:1.90>1.89>1.42>1.33>2.34>1.81 (warfarin held last night due to neck bleeding)  Anticoagulation Plan: -INR Goal: 2.0 - 2.5.   Device: -[employer]tronic single lead -Therapies: on - last check: today at bedside by [employer] Rep  Plan/Recommendations:   1.Please page VAD coordinator for equipment issues or patient concerns 2.Patient is to have a neck/chest CT today.  Balinda Quails RN, VAD Coordinator 24/7 pager [phone]

## 2017-11-25 NOTE — Consult Note (Addendum)
[facility] Consult: 3:25 PM 11/25/2017  LOS: 7 days    Referring Provider: Dr Deatra Robinson  Primary Care Physician:  Janith Lima, MD Primary Gastroenterologist:  Dr. Ardis Hughs       Reason for Consultation:  Hemoptysis, anemia      HPI: Trevor [name] is a 72 y.o. male.  PMH ischemic CM, CHF.  MI and s/p CABG in [place] ~ 2014. S/p TAVR 2015.  s/p ICDs x 2.  A fib.  CHF admit 12/2016, required intraaortic balloon pump prior to LVAD (destination), and tricuspid ring annuloplasty. ESRD on HD MWF.  OSA.  COPD.  Hypothyroidism.  IDDM, type 2. S/p left UE fistula creation 06/2017.    Remote laparotomy age 68 for "twisted intestines".  Chronic thrombocytopenia.  Hypothyroidism.  Seen for the first time by Dr. Ardis Hughs in 02/2017 for anemia and black FOBT positive as well as frankly bloody stools in the setting of Coumadin, 325 ASA and daily Protonix.  He required blood transfusions.  He takes ferric citrate 3 times daily and Protonix 40 mg daily.. 02/14/17 Colonoscopy.  This showed fresh blood throughout the colon but none in the terminal ileum.  Actively oozing pinpoint AVMs at the hepatic flexure were treated with APC and 2 endoclips.  Three, 2-4 mm polyps seen in the sigmoid and descending colon and were not removed.  Left colon diverticulosis.   Dr. 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 Elisabeth Cara.  He was off Coumadin for the surgeries.  Following surgery he was restarted on heparin drip.  However heparin drip was discontinued 1/20 after developing significant bleeding at the excision sites on the neck and hemoptysis.  Started yesterday.  He is required several pressure dressings to the neck and bleeding seems to have  stopped currently.  He has had at least 12 or more episodes of hemoptysis starting yesterday afternoon through the last few hours. Baseline Hgb looks to be about 10.5 in fall 2018. 12.2 on 1/15 with drifting to today's 8.2.   INR max 2.3 on 1/21, 1.5 today.  He has not received transfusion with blood products. For hemoptysis evaluation he underwent contrasted chest CT.   This shows no active, acute pulmonary disease but shows stable chronic pulmonary hypertension and contrast reflux into the IVC and hepatic veins consistent with right heart failure.  LVAD well-positioned.  No mediastinal fluid collections.  Small amount of oral contrast in the mid and lower esophagus suggestive of dysmotility or GERD.  CT neck shows 9.8 cm left-sided hematoma with associated active bleed.  The hematoma is causing mass-effect on the left SCM muscle.  Patient normally has 1 or 2 brown stools daily.  The last time he had a bowel movement was on 1/17, the day of surgery.  He is passing flatus.  He denies any black stools or bleeding per rectum prior to surgery.  Appetite is "too good".  He is gained a few pounds in the last several  days.  In addition to the hemoptysis, he is coughing more than is usual for him.  He feels a bit weak and lightheaded when he stands but this is normal for him.  No abdominal pain.  No nausea or vomiting.   Past Medical History:  Diagnosis Date  . AICD (automatic cardioverter/defibrillator) present   . Asthma   . AVM (arteriovenous malformation) of colon 07/07/2017  . CHF (congestive heart failure) ([place])   . Diabetes ([place])   . ESRF (end stage renal failure) ([place]) 07/07/2017  . Kidney disease   . Permanent atrial fibrillation ([place]) 07/07/2017  . Presence of permanent cardiac pacemaker    AICD  . Sleep apnea     Past Surgical History:  Procedure Laterality Date  . APPLICATION OF A-CELL OF EXTREMITY Left 11/20/2017   Procedure: APPLICATION OF A-CELL;  Surgeon: Wallace Going, DO;   Location: [facility];  Service: Plastics;  Laterality: Left;  . AV FISTULA PLACEMENT Left 06/30/2017   Procedure: CREATION of LEFT ARM Brachiocephalic Fistula;  Surgeon: Conrad Socorro, MD;  Location: [facility];  Service: Vascular;  Laterality: Left;  . CARDIAC CATHETERIZATION N/A 12/02/2016   Procedure: Right Heart Cath;  Surgeon: Larey Dresser, MD;  Location: [facility];  Service: Cardiovascular;  Laterality: N/A;  . COLONOSCOPY N/A 02/14/2017   Procedure: COLONOSCOPY;  Surgeon: Milus Banister, MD;  Location: [facility];  Service: Endoscopy;  Laterality: N/A;  . DIALYSIS/PERMA CATHETER INSERTION  06/30/2017   Procedure: INSERTION Dialysis Catheter;  Surgeon: Conrad Sappington, MD;  Location: [facility];  Service: Vascular;;  . IABP INSERTION N/A 12/11/2016   Procedure: IABP Insertion;  Surgeon: Larey Dresser, MD;  Location: [facility];  Service: Cardiovascular;  Laterality: N/A;  . INSERTION OF IMPLANTABLE LEFT VENTRICULAR ASSIST DEVICE N/A 12/13/2016   Procedure: INSERTION OF IMPLANTABLE LEFT VENTRICULAR ASSIST DEVICE;  Surgeon: Ivin Poot, MD;  Location: [facility];  Service: Open Heart Surgery;  Laterality: N/A;  HEARTMATE II  NITRIC OXIDE  . IR FLUORO GUIDE CV LINE LEFT  06/17/2017  . IR US GUIDE VASC ACCESS LEFT  06/17/2017  . MASS EXCISION Left 11/20/2017   Procedure: EXCISION OF LEFT NECK AND RIGHT LEG SKIN CANCER WITH A CELL PLACEMENT;  Surgeon: Wallace Going, DO;  Location: [facility];  Service: Plastics;  Laterality: Left;  . RIGHT HEART CATH N/A 12/11/2016   Procedure: Right Heart Cath;  Surgeon: Larey Dresser, MD;  Location: [facility];  Service: Cardiovascular;  Laterality: N/A;  . RIGHT HEART CATH N/A 03/27/2017   Procedure: Right Heart Cath;  Surgeon: Larey Dresser, MD;  Location: [facility];  Service: Cardiovascular;  Laterality: N/A;  . RIGHT HEART CATH N/A 06/13/2017   Procedure: RIGHT HEART CATH;  Surgeon: Jolaine Artist, MD;  Location: [facility];  Service: Cardiovascular;  Laterality: N/A;  . TEE WITHOUT CARDIOVERSION N/A 12/13/2016   Procedure: TRANSESOPHAGEAL ECHOCARDIOGRAM (TEE);  Surgeon: Ivin Poot, MD;  Location: [facility];  Service: Open Heart Surgery;  Laterality: N/A;  . TRICUSPID VALVE REPLACEMENT N/A 12/13/2016   Procedure: TRICUSPID VALVE REPAIR WITH EDWARDS MC 3 TRICUSPID ANNULOPLASTY RING MODEL 4900 SIZE T 28;  Surgeon: Ivin Poot, MD;  Location: [facility];  Service: Open Heart Surgery;  Laterality: N/A;    Prior to Admission medications   Medication Sig Start Date End Date Taking? Authorizing Provider  albuterol (PROVENTIL HFA;VENTOLIN HFA) 108 (90 Base)  MCG/ACT inhaler Inhale 2 puffs into the lungs every 4 (four) hours as needed for wheezing or shortness of breath. 11/27/16  Yes Byrum, Rose Fillers, MD  albuterol (PROVENTIL) (2.5 MG/3ML) 0.083% nebulizer solution Take 3 mLs (2.5 mg total) by nebulization every 4 (four) hours. And as needed Patient taking differently: Take 2.5 mg by nebulization every 4 (four) hours as needed for wheezing or shortness of breath.  11/27/16  Yes Byrum, Rose Fillers, MD  atorvastatin (LIPITOR) 40 MG tablet TAKE 1 TABLET(40 MG) BY MOUTH DAILY Patient taking differently: Take 40 mg by mouth daily at 6 PM. TAKE 1 TABLET(40 MG) BY MOUTH DAILY 09/02/17  Yes Larey Dresser, MD  busPIRone (BUSPAR) 5 MG tablet Take 1 tablet (5 mg total) by mouth 2 (two) times daily. 01/01/17  Yes Clegg, Amy D, NP  calcitRIOL (ROCALTROL) 0.5 MCG capsule Take 1 capsule (0.5 mcg total) by mouth every other day. Patient taking differently: Take 0.5 mcg by mouth every Monday, Wednesday, and Friday with hemodialysis.  07/08/17  Yes Bensimhon, Shaune Pascal, MD  docusate sodium (COLACE) 100 MG capsule Take 1 capsule (100 mg total) by mouth 2 (two) times daily. Patient taking differently: Take 100 mg daily by mouth.  04/03/17  Yes Larey Dresser, MD  fluticasone (FLOVENT HFA) 110 MCG/ACT inhaler Inhale 2 puffs into the lungs 2 (two) times  daily. 11/27/16  Yes [name], MD  gabapentin (NEURONTIN) 600 MG tablet Take 0.5 tablets (300 mg total) by mouth 2 (two) times daily. 07/23/17  Yes Larey Dresser, MD  insulin glargine (LANTUS) 100 unit/mL SOPN Inject 0.2 mLs (20 Units total) into the skin at bedtime. Patient taking differently: Inject 25 Units into the skin at bedtime.  01/10/17  Yes Larey Dresser, MD  levothyroxine (SYNTHROID, LEVOTHROID) 25 MCG tablet Take 1 tablet (25 mcg total) by mouth daily before breakfast. 10/02/17  Yes Larey Dresser, MD  multivitamin (RENA-VIT) TABS tablet Take 1 tablet by mouth at bedtime. 09/02/17  Yes Larey Dresser, MD  pantoprazole (PROTONIX) 40 MG tablet Take 1 tablet (40 mg total) by mouth daily. 08/11/17  Yes Clegg, Amy D, NP  sildenafil (REVATIO) 20 MG tablet Take 2 tablets (40 mg total) 3 (three) times daily by mouth. 09/11/17  Yes Larey Dresser, MD  traMADol (ULTRAM) 50 MG tablet TAKE 1 TABLET BY MOUTH EVERY 6 HOURS AS NEEDED FOR MODERATE PAIN 08/14/17  Yes Bensimhon, Shaune Pascal, MD  traZODone (DESYREL) 100 MG tablet Take 1 tablet (100 mg total) by mouth at bedtime as needed for sleep. 09/30/17  Yes Larey Dresser, MD  warfarin (COUMADIN) 5 MG tablet Take 7.5 mg by mouth every evening.    Yes [provider]  doxycycline (VIBRA-TABS) 100 MG tablet Take 1 tablet (100 mg total) by mouth 2 (two) times daily. Patient not taking: Reported on 11/13/2017 11/06/17   Darrick Grinder D, NP  ferric citrate (AURYXIA) 1 GM 210 MG(Fe) tablet Take 420 mg by mouth 3 (three) times daily with meals.    [provider]  guaiFENesin (MUCINEX) 600 MG 12 hr tablet Take 1,200 mg 2 (two) times daily as needed by mouth for cough.    [provider]    Scheduled Meds: . amoxicillin-clavulanate  1 tablet Oral Q24H  . atorvastatin  40 mg Oral q1800  . budesonide  0.5 mg Inhalation BID  . busPIRone  5 mg Oral BID  . calcitRIOL  0.5 mcg Oral Q M,W,F-HD  .  Chlorhexidine Gluconate Cloth   6 each Topical Once  . Chlorhexidine Gluconate Cloth  6 each Topical Once  . darbepoetin (ARANESP) injection - DIALYSIS  100 mcg Intravenous Q Wed-HD  . docusate sodium  100 mg Oral Daily  . ferric citrate  420 mg Oral TID WC  . gabapentin  300 mg Oral BID  . insulin aspart  0-15 Units Subcutaneous TID WC  . insulin aspart  0-5 Units Subcutaneous QHS  . insulin aspart  3 Units Subcutaneous TID WC  . levothyroxine  25 mcg Oral QAC breakfast  . lidocaine (PF)      . multivitamin  1 tablet Oral QHS  . pantoprazole  40 mg Oral Daily  . sildenafil  40 mg Oral TID  . thrombin recombinant  5,000 Units Topical Once  . warfarin  5 mg Oral ONCE-1800  . Warfarin - Pharmacist Dosing Inpatient   Does not apply q1800   Infusions: . sodium chloride 10 mL/hr at 11/20/17 0810   PRN Meds: acetaminophen, albuterol, ferric citrate, guaiFENesin, lidocaine, ondansetron (ZOFRAN) IV, oxyCODONE, traMADol, traZODone   Allergies as of 10/16/2017  . (No Known Allergies)    Family History  Problem Relation Age of Onset  . Heart failure Father   . Heart attack Father   . Healthy Mother   . Diabetes Paternal Grandfather     Social History   Socioeconomic History  . Marital status: Widowed    Spouse name: Not on file  . Number of children: 0  . Years of education: 71  . Highest education level: Not on file  Social Needs  . Financial resource strain: Not on file  . Food insecurity - worry: Not on file  . Food insecurity - inability: Not on file  . Transportation needs - medical: Not on file  . Transportation needs - non-medical: Not on file  Occupational History  . Not on file  Tobacco Use  . Smoking status: Never Smoker  . Smokeless tobacco: Never Used  Substance and Sexual Activity  . Alcohol use: No  . Drug use: No  . Sexual activity: Not Currently  Other Topics Concern  . Not on file  Social History Narrative   Patient is a widow. Moved to [place] from [place], [place].  Currently lives with his sister Matas Burrows.    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     REVIEW OF SYSTEMS: Constitutional:  Per HPI ENT:  No nose bleeds Pulm:  Per HPI CV:  No palpitations, no LE edema.  GU:  No hematuria, no frequency GI:  Per HPI Heme: Prior to the surgeries 5 days ago, the patient was not having any unusual bleeding or bruising. Transfusions: No transfusions thus far during the admission. Neuro:  No headaches, no peripheral tingling or numbness Derm:  No itching, no rash or sores.  Endocrine:  No sweats or chills.  No polyuria or dysuria Immunization: Received his flu vaccination in October 2018. Travel:  None beyond local counties in last few months.    PHYSICAL EXAM: Vital signs in last 24 hours: Vitals:   11/25/17 1210 11/25/17 1243  BP: (!) 121/110 (!) 120/37  Pulse: 78   Resp: 13 11  Temp: 98 F (36.7 C)   SpO2: 97% 97%   Wt Readings from Last 3 Encounters:  11/25/17 83.1 kg (183 lb 3.2 oz)  11/13/17 81.6 kg (180 lb)  10/21/17 80.7 kg (178 lb)    General: Pleasant, chronically ill looking, alert  and comfortable WM.  Although he is FOBT positive it may very well be from him swallowing blood in the sputum. Head: No facial asymmetry or swelling. Eyes: No conjunctival pallor. Ears: Not hard of hearing Nose: No discharge or congestion Mouth: No visible oral lesions.  Tongue midline.  Oral mucosa pink, moist, clear. Neck: Extensive bruising as described below.  Pressure bandage covers the area below and behind the left ear Lungs: Clear bilaterally.  No labored breathing.  Congested sounding cough.  Currently not productive so did not get a look at his sputum. Heart: VAD hum present. Abdomen: Soft.  Not tender.  Not distended.  Active bowel sounds.  That Drive line site benign and not bruised or bleeding..   Rectal: Firm, hard stool in the rectal vault is brown in color.  It tests very trace FOBT positive. Heme: Extensive bruising at the  left neck and behind the left ear.  Bruising extends into the top/back of the left shoulder, into the left upper arm and into the left chest. Musc/Skeltl: No gross joint swelling, redness or deformity. Extremities: Dialysis fistula on Left upper extremity associated with extensive bruising.  Excision site on the right lower extremity covered with non-bloody bandaging.  Neurologic: Fully alert and oriented.  Good historian.  Moves all 4 limbs, no gross limb weakness.  No tremors. Skin: Pinpoint telangiectasia on upper trunk.  Psych: Pleasant, calm, cooperative.  Intake/Output from previous day: 01/21 0701 - 01/22 0700 In: 1300 [P.O.:1300] Out: 2000  Intake/Output this shift: Total I/O In: 240 [P.O.:240] Out: -   LAB RESULTS: Recent Labs    11/23/17 0652 11/24/17 0800 11/25/17 0345  WBC 9.7 10.1 9.4  HGB 8.9* 8.7* 8.6*  HCT 27.6* 26.6* 27.0*  PLT 120* 132* 138*   BMET Lab Results  Component Value Date   NA 130 (L) 11/25/2017   NA 127 (L) 11/24/2017   NA 129 (L) 11/23/2017   K 4.5 11/25/2017   K 4.7 11/24/2017   K 4.1 11/23/2017   CL 94 (L) 11/25/2017   CL 91 (L) 11/24/2017   CL 91 (L) 11/23/2017   CO2 25 11/25/2017   CO2 24 11/24/2017   CO2 24 11/23/2017   GLUCOSE 185 (H) 11/25/2017   GLUCOSE 177 (H) 11/24/2017   GLUCOSE 162 (H) 11/23/2017   BUN 27 (H) 11/25/2017   BUN 50 (H) 11/24/2017   BUN 39 (H) 11/23/2017   CREATININE 4.28 (H) 11/25/2017   CREATININE 6.51 (H) 11/24/2017   CREATININE 5.73 (H) 11/23/2017   CALCIUM 8.3 (L) 11/25/2017   CALCIUM 8.3 (L) 11/24/2017   CALCIUM 8.4 (L) 11/23/2017   LFT Recent Labs    11/23/17 0652 11/24/17 0224 11/25/17 0345  ALBUMIN 3.5 3.5 3.4*   PT/INR Lab Results  Component Value Date   INR 1.81 11/25/2017   INR 2.34 11/24/2017   INR 2.25 11/23/2017    Drugs of Abuse     Component Value Date/Time   LABOPIA NONE DETECTED 12/03/2016 1720   COCAINSCRNUR NONE DETECTED 12/03/2016 1720   LABBENZ NONE DETECTED  12/03/2016 1720   AMPHETMU NONE DETECTED 12/03/2016 1720   THCU NONE DETECTED 12/03/2016 1720   LABBARB NONE DETECTED 12/03/2016 1720     RADIOLOGY STUDIES: Ct Soft Tissue Neck W Contrast  Result Date: 11/25/2017 CLINICAL DATA:  Left neck mass.  Recent excision of skin cancer. EXAM: CT NECK WITH CONTRAST TECHNIQUE: Multidetector CT imaging of the neck was performed using the standard protocol following the bolus administration of intravenous  contrast. CONTRAST:  <See Chart> ISOVUE-300 IOPAMIDOL (ISOVUE-300) INJECTION 61%, <See Chart> ISOVUE-300 IOPAMIDOL (ISOVUE-300) INJECTION 61% COMPARISON:  None. FINDINGS: Pharynx and larynx: Mild esophageal thickening is present. No focal mucosal or submucosal lesion. The tongue base is within normal limits. Vocal cords are midline and symmetric. Oropharynx is within limits. Hypopharynx is unremarkable. The epiglottis is within normal limits. Salivary glands: The submandibular and parotid glands are within normal limits bilaterally. Thyroid: Normal. Lymph nodes: Subcentimeter left supraclavicular nodes are likely reactive. No significant adenopathy is present. Vascular: Atherosclerotic calcifications are present at the carotid bifurcations bilaterally. No definite stenosis of greater than 50%. Limited intracranial: Unremarkable. Visualized orbits: Within normal limits. Mastoids and visualized paranasal sinuses: The paranasal sinuses and mastoid air cells are clear. Skeleton: Degenerative changes are present in the cervical spine. Grade 1 anterolisthesis is present at C3-4. Chronic loss disc space with uncovertebral spurring bilaterally at C4-5, C5-6, and to lesser extent C6-7. Upper chest: The lung apices are clear. Median sternotomy is again noted. Other: A left neck is mixed density collection is noted posterior to the left sternocleidomastoid muscle. The collection measures 9.8 x 3.7 x 3.4 cm. There is gas within the collection. High-density material near the  superior aspect of the collection is concerning for recurrent hemorrhage. This creates mass effect in the subcutaneous space. Some mass effect on the left sternocleidomastoid muscle. IMPRESSION: 1. 9.8 cm mixed density left subcutaneous hyperdense collection compatible with acute hematoma. 2. Active extravasation into the superior aspect of the collection compatible with arterial hemorrhage. 3. Superficial mass effect on the left sternocleidomastoid muscle. 4. Extensive atherosclerotic disease. 5. Atherosclerosis. These results were called by telephone at the time of interpretation on 11/25/2017 at 2:25 pm to [name] , who verbally acknowledged these results. Electronically Signed   By: San Morelle M.D.   On: 11/25/2017 14:31   Ct Chest W Contrast  Result Date: 11/25/2017 CLINICAL DATA:  Inpatient. Hemoptysis. Status post left neck mass surgery. LVAD. EXAM: CT CHEST WITH CONTRAST TECHNIQUE: Multidetector CT imaging of the chest was performed during intravenous contrast administration. CONTRAST:  75 cc ISOVUE-300 IOPAMIDOL (ISOVUE-300) INJECTION 61%, <See Chart> ISOVUE-300 IOPAMIDOL (ISOVUE-300) INJECTION 61% COMPARISON:  Chest radiograph from one day prior. 12/03/2016 chest CT. FINDINGS: Cardiovascular: Mild cardiomegaly. Left ventricular assist device appears well positioned with the inflow catheter in place at the left ventricular apex and outflow catheter anastomosed to the ascending aorta. No significant pericardial fluid/thickening. Aortic valve prosthesis is in place. Tricuspid valve prosthesis is in place. Left main and 3 vessel coronary atherosclerosis status post CABG. Atherosclerotic nonaneurysmal thoracic aorta. Dilated main pulmonary artery (4.1 cm node), stable. No central pulmonary emboli. Single lead right subclavian ICD is noted with lead tip in the right ventricular apex. Left internal jugular central venous catheter terminates at the cavoatrial junction. Mediastinum/Nodes: Stable  hypodense 1.3 cm anterior right thyroid lobe nodule. Small amount of retained oral contrast in the mid to lower thoracic esophagus. No pathologically enlarged axillary, mediastinal or hilar lymph nodes. No mediastinal fluid collections. No pneumomediastinum. Lungs/Pleura: No pneumothorax. No pleural effusion. No acute consolidative airspace disease or lung masses. Mild patchy tree-in-bud opacity in the right lower lobe is unchanged since 12/03/2016 chest CT, compatible with benign postinflammatory opacity. No significant pulmonary nodules. Upper abdomen: Contrast reflux is seen into the IVC and hepatic veins. Musculoskeletal: No aggressive appearing focal osseous lesions. Intact sternotomy wires. Marked thoracic spondylosis. IMPRESSION: 1. No active pulmonary disease. Stable chronic mild postinflammatory tree-in-bud opacities in right lower lobe. 2.  Cardiomegaly. Contrast reflux into the IVC and hepatic veins, indicative of elevated central venous pressures/right heart failure. LVAD appears well-positioned. No mediastinal fluid collections. 3. Stable dilated main pulmonary artery compatible with chronic pulmonary arterial hypertension. 4. Small amount of oral contrast in the mid to lower thoracic esophagus, suggesting esophageal dysmotility and/or gastroesophageal reflux. Aortic Atherosclerosis (ICD10-I70.0). Electronically Signed   By: Ilona Sorrel M.D.   On: 11/25/2017 14:12   Dg Chest Port 1 View  Result Date: 11/24/2017 CLINICAL DATA:  Shortness of breath.  Hemoptysis. EXAM: PORTABLE CHEST 1 VIEW COMPARISON:  11/06/2017 and 09/08/2017 FINDINGS: Chronic cardiomegaly with left ventricular assist device, tricuspid and aortic valve replacements, double lumen central catheter and AICD in place. The pulmonary vascularity is normal and the lungs are clear. No pneumothorax. Chronic thoracolumbar scoliosis. IMPRESSION: No acute abnormalities.  No change since the prior exam. Electronically Signed   By: Lorriane Shire  M.D.   On: 11/24/2017 08:54      IMPRESSION:   *   Acute blood loss anemia.  This in the setting of significant bleeding from the site of recent squamous cell excision on the left neck.  Not only has there been overt bleeding from the site but the area surrounding this has extensive hematomas radiating into the chest, upper back and left arm.  This certainly accounts for his anemia. With the ongoing hemoptysis, suspect the patient may have swallowed some blood which would account for the FOBT positive test. Low suspicion for active GI bleed. Reflux and/or dysmotility noted on chest CT.  This should be adequately covered with Protonix 40 mg daily. Lower GIB and colonoscopy in 02/2017.  Probably diverticular.  Small polyps were not removed.     *  LVAD pt on chronic Coumadin, on hold along with Heparin due to surgical site bleeding at neck  *    Squamous cell carcinoma.  Resected from the left neck and right lower leg.  Pathology from the neck shows poorly differentiated squamous cell carcinoma with extension into the margin on 1 of the lesions and minimal clean margins on the other.  The squamous cell on the right leg is well-differentiated and margins are free of cancer.  Plan is for repeat surgery, per plastic surgeon, tomorrow.    *  ESRD.     PLAN:     *   No plans for performing upper endoscopy.  Continue daily Protonix.  Follow CBC.   Azucena Freed  11/25/2017, 3:25 PM Pager: [phone]     Attending physician's note   I have taken a history, examined the patient and reviewed the chart. I agree with the Advanced Practitioner's note, impression and recommendations.  Hemoptysis and actively bleeding at recent left neck excision site of a squamous cell cancer. Hemoptysis is described as small blood clots intermittently for the past 2 days with coughing or clearing his throat. No nausea or vomiting noted.  LVAD pt on chronic anticoagulation. He has a large left neck hematoma with  active oozing at the incision.  Heme positive stool could result from swallowing blood with hemoptysis.  Hemoptysis is not from a GI source - could be related to his recent neck surgery or other ENT or pulmonary source. No plans for EGD. Consider ENT and pulmonary evaluation if bleeding not controlled by [name]. Transfuse to keep Hb > 7. GI signing off.   Lucio Edward, MD [facility] [phone] Mon-Fri 8a-5p [phone] after 5p, weekends, holidays

## 2017-11-25 NOTE — Progress Notes (Signed)
[place] for IV Heparin >>Coumadin Indication: LVAD  No Active Allergies  Patient Measurements: Height: 5\' 5"  (165.1 cm) Weight: 183 lb 3.2 oz (83.1 kg) IBW/kg (Calculated) : 61.5 Heparin Dosing Weight: 78 kg  Vital Signs: Temp: 98.1 F (36.7 C) (01/22 0823) Temp Source: Oral (01/22 0823) BP: 83/72 (01/22 0823) Pulse Rate: 46 (01/22 0823)  Labs: Recent Labs    11/23/17 0652 11/24/17 0224 11/24/17 0800 11/25/17 0345  HGB 8.9*  --  8.7* 8.6*  HCT 27.6*  --  26.6* 27.0*  PLT 120*  --  132* 138*  LABPROT 24.7* 25.4*  --  20.9*  INR 2.25 2.34  --  1.81  CREATININE 5.73* 6.51*  --  4.28*    Estimated Creatinine Clearance: 15.5 mL/min (A) (by C-G formula based on SCr of 4.28 mg/dL (H)).  Assessment: 72 yo male with LVAD, admitted for IV heparin bridge before dermatologic surgery 1/17.  PTA Coumadin dose 7.5 mg daily, last dose taken 1/13 per patient report.  Pt is now s/p excision of left neck and right leg > with neck hematoma 1/21> drained in room by plastic surgeon - still oozing last pm so held warfarin - improved today> dressing again changed and evacuated hematoma by plastic surgeon - no more planned procedures - ok to restart warfarin.   Keep INR on low end range.   INR 1.8 hgb now down 10>8.9>8.6 but bleeding better and h/h low but ok.  Goal of Therapy:  INR goal 2-2.5 Monitor platelets by anticoagulation protocol: Yes   Plan:  Warfarin 5mg  tonight  Daily INR  [name] Pharm.D. CPP, BCPS Clinical Pharmacist [phone] 11/25/2017 11:12 AM    PM addendum Still oozing from neck wound > will hold warfarin tonight in case needs more procedures with bleeding   [name] Pharm.D. CPP, BCPS Clinical Pharmacist [phone] 11/25/2017 11:12 AM

## 2017-11-25 NOTE — Progress Notes (Addendum)
Patient ID: Trevor [name], male   DOB: February 24, 1946, 72 y.o.   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  Path shows extension of squamous cell CA to margins at neck site.   Decreased bleeding at neck site, surgery has already changed dressing this morning.  Hemoglobin stable at 8.6.   Patient continues to have hemoptysis. CXR was unremarkable.   INR 1.8 today.  LDH 256.  HD yesterday, no problems.    LVAD INTERROGATION:  HeartMate II LVAD:  Flow 6.0 liters/min, speed 9600, power 6.6, PI 3.3.  Multiple PI events.   Objective:    Vital Signs:   Temp:  [97.5 F (36.4 C)-98.8 F (37.1 C)] 97.5 F (36.4 C) (01/22 0356) Pulse Rate:  [50-83] 55 (01/22 0356) Resp:  [11-20] 17 (01/22 0356) BP: (77-105)/(31-74) 95/73 (01/22 0356) SpO2:  [93 %-98 %] 97 % (01/22 0356) Weight:  [182 lb 5.1 oz (82.7 kg)-187 lb 13.3 oz (85.2 kg)] 183 lb 3.2 oz (83.1 kg) (01/22 0356) Last BM Date: 11/19/17 Mean arterial Pressure 79  Intake/Output:   Intake/Output Summary (Last 24 hours) at 11/25/2017 1025 Last data filed at 11/25/2017 0400 Gross per 24 hour  Intake 1300 ml  Output 2000 ml  Net -700 ml     Physical Exam    GENERAL: Well appearing this am. NAD.  HEENT: Normal. NECK: Supple, JVP 7-8 cm. Carotids OK.  CARDIAC:  Mechanical heart sounds with LVAD hum present.  LUNGS:  CTAB, normal effort.  ABDOMEN:  NT, ND, no HSM. No bruits or masses. +BS  LVAD exit site: Well-healed and incorporated. Dressing dry and intact. No erythema or drainage. Stabilization device present and accurately applied. Driveline dressing changed daily per sterile technique. EXTREMITIES:  Warm and dry. No cyanosis, clubbing, rash, or edema.  NEUROLOGIC:  Alert & oriented x 3. Cranial nerves grossly intact. Moves all 4 extremities w/o difficulty. Affect pleasant    Telemetry   Atrial fibrillation 70s, Personally  reviewed   Labs   Basic Metabolic Panel: Recent Labs  Lab 11/19/17 0227 11/20/17 0240 11/22/17 0131 11/23/17 0652 11/24/17 0224 11/25/17 0345  NA 131* 131* 132* 129* 127* 130*  K 4.6 3.6 4.1 4.1 4.7 4.5  CL 93* 93* 94* 91* 91* 94*  CO2 24 26 25 24 24 25   GLUCOSE 187* 243* 122* 162* 177* 185*  BUN 47* 25* 21* 39* 50* 27*  CREATININE 6.30* 4.32* 3.78* 5.73* 6.51* 4.28*  CALCIUM 8.5* 8.3* 8.8* 8.4* 8.3* 8.3*  PHOS 6.3* 4.6  --  5.4* 5.4* 3.8    Liver Function Tests: Recent Labs  Lab 11/18/17 1137 11/19/17 0227 11/20/17 0240 11/23/17 0652 11/24/17 0224 11/25/17 0345  AST 31  --   --   --   --   --   ALT 20  --   --   --   --   --   ALKPHOS 81  --   --   --   --   --   BILITOT 1.0  --   --   --   --   --   PROT 7.5  --   --   --   --   --   ALBUMIN 3.8 3.3* 3.3* 3.5 3.5 3.4*   No results for input(s): LIPASE, AMYLASE in the last 168 hours. No results for input(s): AMMONIA in the last 168  hours.  CBC: Recent Labs  Lab 11/18/17 1137  11/22/17 0131 11/22/17 1330 11/23/17 0652 11/24/17 0800 11/25/17 0345  WBC 10.1   < > 12.9* 10.8* 9.7 10.1 9.4  NEUTROABS 7.6  --   --   --   --  7.4 7.5  HGB 12.2*   < > 10.8* 10.0* 8.9* 8.7* 8.6*  HCT 38.0*   < > 33.3* 31.4* 27.6* 26.6* 27.0*  MCV 94.8   < > 92.8 95.4 93.6 92.7 94.4  PLT 110*   < > 130* 132* 120* 132* 138*   < > = values in this interval not displayed.    INR: Recent Labs  Lab 11/21/17 0307 11/22/17 0131 11/23/17 0652 11/24/17 0224 11/25/17 0345  INR 1.33 1.60 2.25 2.34 1.81    Other results:     Imaging   Dg Chest Port 1 View  Result Date: 11/24/2017 CLINICAL DATA:  Shortness of breath.  Hemoptysis. EXAM: PORTABLE CHEST 1 VIEW COMPARISON:  11/06/2017 and 09/08/2017 FINDINGS: Chronic cardiomegaly with left ventricular assist device, tricuspid and aortic valve replacements, double lumen central catheter and AICD in place. The pulmonary vascularity is normal and the lungs are clear. No  pneumothorax. Chronic thoracolumbar scoliosis. IMPRESSION: No acute abnormalities.  No change since the prior exam. Electronically Signed   By: Lorriane Shire M.D.   On: 11/24/2017 08:54     Medications:     Scheduled Medications: . amoxicillin-clavulanate  1 tablet Oral Q12H  . atorvastatin  40 mg Oral q1800  . budesonide  0.5 mg Inhalation BID  . busPIRone  5 mg Oral BID  . calcitRIOL  0.5 mcg Oral Q M,W,F-HD  . Chlorhexidine Gluconate Cloth  6 each Topical Once  . Chlorhexidine Gluconate Cloth  6 each Topical Once  . darbepoetin (ARANESP) injection - DIALYSIS  100 mcg Intravenous Q Wed-HD  . docusate sodium  100 mg Oral Daily  . ferric citrate  420 mg Oral TID WC  . gabapentin  300 mg Oral BID  . insulin aspart  0-15 Units Subcutaneous TID WC  . insulin aspart  0-5 Units Subcutaneous QHS  . insulin aspart  3 Units Subcutaneous TID WC  . levothyroxine  25 mcg Oral QAC breakfast  . lidocaine (PF)      . multivitamin  1 tablet Oral QHS  . pantoprazole  40 mg Oral Daily  . sildenafil  40 mg Oral TID  . thrombin recombinant  5,000 Units Topical Once  . Warfarin - Pharmacist Dosing Inpatient   Does not apply q1800    Infusions: . sodium chloride 10 mL/hr at 11/20/17 0810    PRN Medications: acetaminophen, albuterol, ferric citrate, guaiFENesin, lidocaine, ondansetron (ZOFRAN) IV, oxyCODONE, traMADol, traZODone   Patient Profile   Trevor [name] is a 72 y.o. male  history of CAD s/p CABG x 4 2010, OSA, AS with TAVR 2015, ESRD, DM2, paroxysmal atrial fibrillation, asthma, and ischemic cardiomyopathy with Medtronic ICD. 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 Post op period complicated by ARF leading to ESRD and need for chronic dialysis.   Admitted 11/18/17 for heparin bridging for Excision of SCC x 2.   Assessment/Plan:    1. 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  Margins not clear at neck.  He has extensive ecchymosis around neck excision site into chest and  left arm but bleeding appears to have slowed/stopped.  Hemoglobin stable today.  - Continue pressure dressing.  - Keep INR on the low  end of goal (2-2.5).   - Will keep active T&S. Transfuse HGb < 8.0 - Further steps in terms of neck site cancer per surgery.   - Will add Augmentin bid.  2. Chronic systolic CHF: Ischemic cardiomyopathy, EF 20-25% with RV dysfunction on 2/18 echo pre-LVAD. 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 Medtronic ICD. Volume status looks stable on exam with HD. MAP stable today.  - Continue sildenafil 40 TID for RV failure - warfarin goal 2-2.5.  3. CAD: s/p CABG.  No evidence for ischemia.     4. ESRD: HD needs to be done at bedside with VAD trained personnel present. Due for HD again Wednesday.  5. Atrial fibrillation: Chronic. - INR 1.8, heparin off.  Run at low end of therapeutic with bleeding, warfarin continues.  6. RV failure: Continue Revatio as above.  7. AVW:PVXY stable 70s-80s  8. Anemia: Baseline anemia of renal disease/chronic disease but recent bleeding from colonic AVMs.  - Hgb down 10.0-> 8.6 with surgical site bleeding, now stable.  9. 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 Off ASA with goal INR 2-2.5.  - Continue octreotide as outpatient.  10. H/o TAVR:  Stable on most recent echo.  11. Hemoptysis: ?Bleeding site.  No obvious mouth lesion.  [name] has evaluated neck surgical site, unlikely to be from there.  He says he coughs up the blood.  CXR was unremarkable, will get CT chest today. Concerned for possible metastatic disease given deep invasion of neck squamous cell CA.  If CT chest unremarkable, will need GI evaluation of upper GI tract (EGD).   VAD interrogated personally. Parameters stable.  Length of Stay: 7  Loralie Champagne, MD 11/25/2017, 8:21 AM  VAD Team --- VAD ISSUES ONLY--- Pager [phone] (7am - 7am)  Advanced Heart Failure Team  Pager [phone] (M-F; 7a - 4p)  Please contact [facility] for  night-coverage after hours (4p -7a ) and weekends on [email or web address]

## 2017-11-25 NOTE — Progress Notes (Signed)
PHARMACY NOTE:  ANTIMICROBIAL RENAL DOSAGE ADJUSTMENT  Current antimicrobial regimen includes a mismatch between antimicrobial dosage and estimated renal function.  As per policy approved by the Pharmacy & Therapeutics and Medical Executive Committees, the antimicrobial dosage will be adjusted accordingly.  Current antimicrobial dosage:  Augmentin 875-125 po BID  Indication:   Renal Function:  Estimated Creatinine Clearance: 15.5 mL/min (A) (by C-G formula based on SCr of 4.28 mg/dL (H)). [x]      On intermittent HD, scheduled: []      On CRRT    Antimicrobial dosage has been changed to:  Augmentin 500-125 po daily.   Additional comments: n/a   Thank you for allowing pharmacy to be a part of this patient's care.  Brain Hilts, [facility] 11/25/2017 8:35 AM

## 2017-11-25 NOTE — Plan of Care (Signed)
Continue current care plan 

## 2017-11-25 NOTE — Progress Notes (Addendum)
  Due to active bleeding noted CT of neck will need to hold coumadin and heparin drip.   INR 1.5    Discussed with Dr Precious Bard Np-C  4:12 PM  CT neck reviewed => suspect still active arterial bleeding, changing dressing every couple of hours.  INR 1.5, hgb 8.2.  With active bleeding, will have to hold coumadin/heparin for now.  Repeat CBC in another 6 hrs, transfuse hgb < 8.   CT chest: No source for hemoptysis noted.   [name] to reassess this evening.  I am concerned that he will need to return to the OR given ongoing bleeding.   Loralie Champagne 11/25/2017 4:24 PM

## 2017-11-25 NOTE — Progress Notes (Signed)
Bedside report given to tanya rn, pt transferred to [place] via bed with belongings and LVAD cart.

## 2017-11-25 NOTE — Progress Notes (Signed)
Subjective: Patient seen in the morning and again this evening.  Hematoma present both times.  Objective: Vital signs in last 24 hours: Temp:  [97.5 F (36.4 C)-98.8 F (37.1 C)] 98 F (36.7 C) (01/22 1210) Pulse Rate:  [46-78] 78 (01/22 1210) Resp:  [11-17] 11 (01/22 1243) BP: (81-121)/(37-110) 120/37 (01/22 1243) SpO2:  [93 %-99 %] 97 % (01/22 1243) Weight:  [82.7 kg (182 lb 5.1 oz)-83.1 kg (183 lb 3.2 oz)] 83.1 kg (183 lb 3.2 oz) (01/22 0356) Weight change: 0.2 kg (7.1 oz) Last BM Date: 11/25/17  Intake/Output from previous day: 01/21 0701 - 01/22 0700 In: 1300 [P.O.:1300] Out: 2000  Intake/Output this shift: Total I/O In: 240 [P.O.:240] Out: -   General appearance: alert, cooperative and no distress Incision/Wound: hematoma evacuated.  Lab Results: Recent Labs    11/25/17 0345 11/25/17 1507  WBC 9.4 10.4  HGB 8.6* 8.2*  HCT 27.0* 25.1*  PLT 138* 139*   BMET Recent Labs    11/24/17 0224 11/25/17 0345  NA 127* 130*  K 4.7 4.5  CL 91* 94*  CO2 24 25  GLUCOSE 177* 185*  BUN 50* 27*  CREATININE 6.51* 4.28*  CALCIUM 8.3* 8.3*    Studies/Results: Ct Soft Tissue Neck W Contrast  Result Date: 11/25/2017 CLINICAL DATA:  Left neck mass.  Recent excision of skin cancer. EXAM: CT NECK WITH CONTRAST TECHNIQUE: Multidetector CT imaging of the neck was performed using the standard protocol following the bolus administration of intravenous contrast. CONTRAST:  <See Chart> ISOVUE-300 IOPAMIDOL (ISOVUE-300) INJECTION 61%, <See Chart> ISOVUE-300 IOPAMIDOL (ISOVUE-300) INJECTION 61% COMPARISON:  None. FINDINGS: Pharynx and larynx: Mild esophageal thickening is present. No focal mucosal or submucosal lesion. The tongue base is within normal limits. Vocal cords are midline and symmetric. Oropharynx is within limits. Hypopharynx is unremarkable. The epiglottis is within normal limits. Salivary glands: The submandibular and parotid glands are within normal limits bilaterally.  Thyroid: Normal. Lymph nodes: Subcentimeter left supraclavicular nodes are likely reactive. No significant adenopathy is present. Vascular: Atherosclerotic calcifications are present at the carotid bifurcations bilaterally. No definite stenosis of greater than 50%. Limited intracranial: Unremarkable. Visualized orbits: Within normal limits. Mastoids and visualized paranasal sinuses: The paranasal sinuses and mastoid air cells are clear. Skeleton: Degenerative changes are present in the cervical spine. Grade 1 anterolisthesis is present at C3-4. Chronic loss disc space with uncovertebral spurring bilaterally at C4-5, C5-6, and to lesser extent C6-7. Upper chest: The lung apices are clear. Median sternotomy is again noted. Other: A left neck is mixed density collection is noted posterior to the left sternocleidomastoid muscle. The collection measures 9.8 x 3.7 x 3.4 cm. There is gas within the collection. High-density material near the superior aspect of the collection is concerning for recurrent hemorrhage. This creates mass effect in the subcutaneous space. Some mass effect on the left sternocleidomastoid muscle. IMPRESSION: 1. 9.8 cm mixed density left subcutaneous hyperdense collection compatible with acute hematoma. 2. Active extravasation into the superior aspect of the collection compatible with arterial hemorrhage. 3. Superficial mass effect on the left sternocleidomastoid muscle. 4. Extensive atherosclerotic disease. 5. Atherosclerosis. These results were called by telephone at the time of interpretation on 11/25/2017 at 2:25 pm to [name] , who verbally acknowledged these results. Electronically Signed   By: San Morelle M.D.   On: 11/25/2017 14:31   Ct Chest W Contrast  Result Date: 11/25/2017 CLINICAL DATA:  Inpatient. Hemoptysis. Status post left neck mass surgery. LVAD. EXAM: CT CHEST WITH CONTRAST TECHNIQUE:  Multidetector CT imaging of the chest was performed during intravenous contrast  administration. CONTRAST:  75 cc ISOVUE-300 IOPAMIDOL (ISOVUE-300) INJECTION 61%, <See Chart> ISOVUE-300 IOPAMIDOL (ISOVUE-300) INJECTION 61% COMPARISON:  Chest radiograph from one day prior. 12/03/2016 chest CT. FINDINGS: Cardiovascular: Mild cardiomegaly. Left ventricular assist device appears well positioned with the inflow catheter in place at the left ventricular apex and outflow catheter anastomosed to the ascending aorta. No significant pericardial fluid/thickening. Aortic valve prosthesis is in place. Tricuspid valve prosthesis is in place. Left main and 3 vessel coronary atherosclerosis status post CABG. Atherosclerotic nonaneurysmal thoracic aorta. Dilated main pulmonary artery (4.1 cm node), stable. No central pulmonary emboli. Single lead right subclavian ICD is noted with lead tip in the right ventricular apex. Left internal jugular central venous catheter terminates at the cavoatrial junction. Mediastinum/Nodes: Stable hypodense 1.3 cm anterior right thyroid lobe nodule. Small amount of retained oral contrast in the mid to lower thoracic esophagus. No pathologically enlarged axillary, mediastinal or hilar lymph nodes. No mediastinal fluid collections. No pneumomediastinum. Lungs/Pleura: No pneumothorax. No pleural effusion. No acute consolidative airspace disease or lung masses. Mild patchy tree-in-bud opacity in the right lower lobe is unchanged since 12/03/2016 chest CT, compatible with benign postinflammatory opacity. No significant pulmonary nodules. Upper abdomen: Contrast reflux is seen into the IVC and hepatic veins. Musculoskeletal: No aggressive appearing focal osseous lesions. Intact sternotomy wires. Marked thoracic spondylosis. IMPRESSION: 1. No active pulmonary disease. Stable chronic mild postinflammatory tree-in-bud opacities in right lower lobe. 2. Cardiomegaly. Contrast reflux into the IVC and hepatic veins, indicative of elevated central venous pressures/right heart failure. LVAD  appears well-positioned. No mediastinal fluid collections. 3. Stable dilated main pulmonary artery compatible with chronic pulmonary arterial hypertension. 4. Small amount of oral contrast in the mid to lower thoracic esophagus, suggesting esophageal dysmotility and/or gastroesophageal reflux. Aortic Atherosclerosis (ICD10-I70.0). Electronically Signed   By: Ilona Sorrel M.D.   On: 11/25/2017 14:12   Dg Chest Port 1 View  Result Date: 11/24/2017 CLINICAL DATA:  Shortness of breath.  Hemoptysis. EXAM: PORTABLE CHEST 1 VIEW COMPARISON:  11/06/2017 and 09/08/2017 FINDINGS: Chronic cardiomegaly with left ventricular assist device, tricuspid and aortic valve replacements, double lumen central catheter and AICD in place. The pulmonary vascularity is normal and the lungs are clear. No pneumothorax. Chronic thoracolumbar scoliosis. IMPRESSION: No acute abnormalities.  No change since the prior exam. Electronically Signed   By: Lorriane Shire M.D.   On: 11/24/2017 08:54    Medications: I have reviewed the patient's current medications.  Assessment/Plan: Hematoma evacuated and Thrombin placed.  No active bleeding either time.  Plan OR in AM.  LOS: 7 days    Wallace Going 11/25/2017

## 2017-11-25 NOTE — Progress Notes (Signed)
   CT results called to Dr [name] per Dr Oleh Genin request.  Requested GI consult for blood in mouth. Hemoptysis reported daily.   Concerning for active bleeding. Requiring dressing changes to left neck every 1-2 hours.  Dr [name] aware and will follow up today.   Plan to make NPO possible exploration in am.  Check INR, CBC, and type & screen now. If Hgb lower will transfuse.   Map 88 . Complaining fatigue and dizziness when standing.    Amy Clegg NP-C  3:03 PM

## 2017-11-25 NOTE — H&P (View-Only) (Signed)
Subjective: Patient seen in the morning and again this evening.  Hematoma present both times.  Objective: Vital signs in last 24 hours: Temp:  [97.5 F (36.4 C)-98.8 F (37.1 C)] 98 F (36.7 C) (01/22 1210) Pulse Rate:  [46-78] 78 (01/22 1210) Resp:  [11-17] 11 (01/22 1243) BP: (81-121)/(37-110) 120/37 (01/22 1243) SpO2:  [93 %-99 %] 97 % (01/22 1243) Weight:  [82.7 kg (182 lb 5.1 oz)-83.1 kg (183 lb 3.2 oz)] 83.1 kg (183 lb 3.2 oz) (01/22 0356) Weight change: 0.2 kg (7.1 oz) Last BM Date: 11/25/17  Intake/Output from previous day: 01/21 0701 - 01/22 0700 In: 1300 [P.O.:1300] Out: 2000  Intake/Output this shift: Total I/O In: 240 [P.O.:240] Out: -   General appearance: alert, cooperative and no distress Incision/Wound: hematoma evacuated.  Lab Results: Recent Labs    11/25/17 0345 11/25/17 1507  WBC 9.4 10.4  HGB 8.6* 8.2*  HCT 27.0* 25.1*  PLT 138* 139*   BMET Recent Labs    11/24/17 0224 11/25/17 0345  NA 127* 130*  K 4.7 4.5  CL 91* 94*  CO2 24 25  GLUCOSE 177* 185*  BUN 50* 27*  CREATININE 6.51* 4.28*  CALCIUM 8.3* 8.3*    Studies/Results: Ct Soft Tissue Neck W Contrast  Result Date: 11/25/2017 CLINICAL DATA:  Left neck mass.  Recent excision of skin cancer. EXAM: CT NECK WITH CONTRAST TECHNIQUE: Multidetector CT imaging of the neck was performed using the standard protocol following the bolus administration of intravenous contrast. CONTRAST:  <See Chart> ISOVUE-300 IOPAMIDOL (ISOVUE-300) INJECTION 61%, <See Chart> ISOVUE-300 IOPAMIDOL (ISOVUE-300) INJECTION 61% COMPARISON:  None. FINDINGS: Pharynx and larynx: Mild esophageal thickening is present. No focal mucosal or submucosal lesion. The tongue base is within normal limits. Vocal cords are midline and symmetric. Oropharynx is within limits. Hypopharynx is unremarkable. The epiglottis is within normal limits. Salivary glands: The submandibular and parotid glands are within normal limits bilaterally.  Thyroid: Normal. Lymph nodes: Subcentimeter left supraclavicular nodes are likely reactive. No significant adenopathy is present. Vascular: Atherosclerotic calcifications are present at the carotid bifurcations bilaterally. No definite stenosis of greater than 50%. Limited intracranial: Unremarkable. Visualized orbits: Within normal limits. Mastoids and visualized paranasal sinuses: The paranasal sinuses and mastoid air cells are clear. Skeleton: Degenerative changes are present in the cervical spine. Grade 1 anterolisthesis is present at C3-4. Chronic loss disc space with uncovertebral spurring bilaterally at C4-5, C5-6, and to lesser extent C6-7. Upper chest: The lung apices are clear. Median sternotomy is again noted. Other: A left neck is mixed density collection is noted posterior to the left sternocleidomastoid muscle. The collection measures 9.8 x 3.7 x 3.4 cm. There is gas within the collection. High-density material near the superior aspect of the collection is concerning for recurrent hemorrhage. This creates mass effect in the subcutaneous space. Some mass effect on the left sternocleidomastoid muscle. IMPRESSION: 1. 9.8 cm mixed density left subcutaneous hyperdense collection compatible with acute hematoma. 2. Active extravasation into the superior aspect of the collection compatible with arterial hemorrhage. 3. Superficial mass effect on the left sternocleidomastoid muscle. 4. Extensive atherosclerotic disease. 5. Atherosclerosis. These results were called by telephone at the time of interpretation on 11/25/2017 at 2:25 pm to [name] , who verbally acknowledged these results. Electronically Signed   By: San Morelle M.D.   On: 11/25/2017 14:31   Ct Chest W Contrast  Result Date: 11/25/2017 CLINICAL DATA:  Inpatient. Hemoptysis. Status post left neck mass surgery. LVAD. EXAM: CT CHEST WITH CONTRAST TECHNIQUE:  Multidetector CT imaging of the chest was performed during intravenous contrast  administration. CONTRAST:  75 cc ISOVUE-300 IOPAMIDOL (ISOVUE-300) INJECTION 61%, <See Chart> ISOVUE-300 IOPAMIDOL (ISOVUE-300) INJECTION 61% COMPARISON:  Chest radiograph from one day prior. 12/03/2016 chest CT. FINDINGS: Cardiovascular: Mild cardiomegaly. Left ventricular assist device appears well positioned with the inflow catheter in place at the left ventricular apex and outflow catheter anastomosed to the ascending aorta. No significant pericardial fluid/thickening. Aortic valve prosthesis is in place. Tricuspid valve prosthesis is in place. Left main and 3 vessel coronary atherosclerosis status post CABG. Atherosclerotic nonaneurysmal thoracic aorta. Dilated main pulmonary artery (4.1 cm node), stable. No central pulmonary emboli. Single lead right subclavian ICD is noted with lead tip in the right ventricular apex. Left internal jugular central venous catheter terminates at the cavoatrial junction. Mediastinum/Nodes: Stable hypodense 1.3 cm anterior right thyroid lobe nodule. Small amount of retained oral contrast in the mid to lower thoracic esophagus. No pathologically enlarged axillary, mediastinal or hilar lymph nodes. No mediastinal fluid collections. No pneumomediastinum. Lungs/Pleura: No pneumothorax. No pleural effusion. No acute consolidative airspace disease or lung masses. Mild patchy tree-in-bud opacity in the right lower lobe is unchanged since 12/03/2016 chest CT, compatible with benign postinflammatory opacity. No significant pulmonary nodules. Upper abdomen: Contrast reflux is seen into the IVC and hepatic veins. Musculoskeletal: No aggressive appearing focal osseous lesions. Intact sternotomy wires. Marked thoracic spondylosis. IMPRESSION: 1. No active pulmonary disease. Stable chronic mild postinflammatory tree-in-bud opacities in right lower lobe. 2. Cardiomegaly. Contrast reflux into the IVC and hepatic veins, indicative of elevated central venous pressures/right heart failure. LVAD  appears well-positioned. No mediastinal fluid collections. 3. Stable dilated main pulmonary artery compatible with chronic pulmonary arterial hypertension. 4. Small amount of oral contrast in the mid to lower thoracic esophagus, suggesting esophageal dysmotility and/or gastroesophageal reflux. Aortic Atherosclerosis (ICD10-I70.0). Electronically Signed   By: Ilona Sorrel M.D.   On: 11/25/2017 14:12   Dg Chest Port 1 View  Result Date: 11/24/2017 CLINICAL DATA:  Shortness of breath.  Hemoptysis. EXAM: PORTABLE CHEST 1 VIEW COMPARISON:  11/06/2017 and 09/08/2017 FINDINGS: Chronic cardiomegaly with left ventricular assist device, tricuspid and aortic valve replacements, double lumen central catheter and AICD in place. The pulmonary vascularity is normal and the lungs are clear. No pneumothorax. Chronic thoracolumbar scoliosis. IMPRESSION: No acute abnormalities.  No change since the prior exam. Electronically Signed   By: Lorriane Shire M.D.   On: 11/24/2017 08:54    Medications: I have reviewed the patient's current medications.  Assessment/Plan: Hematoma evacuated and Thrombin placed.  No active bleeding either time.  Plan OR in AM.  LOS: 7 days    Wallace Going 11/25/2017

## 2017-11-26 ENCOUNTER — Inpatient Hospital Stay (HOSPITAL_COMMUNITY): Payer: Medicare Other

## 2017-11-26 ENCOUNTER — Encounter (HOSPITAL_COMMUNITY)
Admission: RE | Disposition: A | Discharge status: Home Health | Payer: Self-pay | Source: Ambulatory Visit | Attending: Cardiology

## 2017-11-26 ENCOUNTER — Ambulatory Visit: Admit: 2017-11-26 | Payer: Medicare Other | Admitting: Plastic Surgery

## 2017-11-26 ENCOUNTER — Ambulatory Visit: Payer: Self-pay | Admitting: Plastic Surgery

## 2017-11-26 ENCOUNTER — Encounter (HOSPITAL_COMMUNITY): Payer: Self-pay | Admitting: Plastic Surgery

## 2017-11-26 DIAGNOSIS — C4490 Unspecified malignant neoplasm of skin, unspecified: Secondary | ICD-10-CM

## 2017-11-26 HISTORY — PX: MASS EXCISION: SHX2000

## 2017-11-26 LAB — CBC WITH DIFFERENTIAL/PLATELET
BASOS PCT: 0 %
Basophils Absolute: 0 10*3/uL (ref 0.0–0.1)
EOS ABS: 0.3 10*3/uL (ref 0.0–0.7)
Eosinophils Relative: 3 %
HEMATOCRIT: 25.9 % — AB (ref 39.0–52.0)
Hemoglobin: 8.2 g/dL — ABNORMAL LOW (ref 13.0–17.0)
Lymphocytes Relative: 15 %
Lymphs Abs: 1.4 10*3/uL (ref 0.7–4.0)
MCH: 30 pg (ref 26.0–34.0)
MCHC: 31.7 g/dL (ref 30.0–36.0)
MCV: 94.9 fL (ref 78.0–100.0)
MONO ABS: 1 10*3/uL (ref 0.1–1.0)
MONOS PCT: 10 %
NEUTROS ABS: 7.2 10*3/uL (ref 1.7–7.7)
Neutrophils Relative %: 72 %
PLATELETS: 144 10*3/uL — AB (ref 150–400)
RBC: 2.73 MIL/uL — ABNORMAL LOW (ref 4.22–5.81)
RDW: 18.4 % — ABNORMAL HIGH (ref 11.5–15.5)
WBC: 9.9 10*3/uL (ref 4.0–10.5)

## 2017-11-26 LAB — RENAL FUNCTION PANEL
ALBUMIN: 3.4 g/dL — AB (ref 3.5–5.0)
ANION GAP: 13 (ref 5–15)
BUN: 42 mg/dL — ABNORMAL HIGH (ref 6–20)
CALCIUM: 8.5 mg/dL — AB (ref 8.9–10.3)
CHLORIDE: 92 mmol/L — AB (ref 101–111)
CO2: 24 mmol/L (ref 22–32)
CREATININE: 5.75 mg/dL — AB (ref 0.61–1.24)
GFR calc Af Amer: 10 mL/min — ABNORMAL LOW (ref >60)
GFR calc non Af Amer: 9 mL/min — ABNORMAL LOW (ref >60)
Glucose, Bld: 166 mg/dL — ABNORMAL HIGH (ref 65–99)
Phosphorus: 4 mg/dL (ref 2.5–4.6)
Potassium: 4.8 mmol/L (ref 3.5–5.1)
Sodium: 129 mmol/L — ABNORMAL LOW (ref 135–145)

## 2017-11-26 LAB — PROTIME-INR
INR: 1.4
PROTHROMBIN TIME: 17.1 s — AB (ref 11.4–15.2)

## 2017-11-26 LAB — GLUCOSE, CAPILLARY
GLUCOSE-CAPILLARY: 161 mg/dL — AB (ref 65–99)
Glucose-Capillary: 167 mg/dL — ABNORMAL HIGH (ref 65–99)
Glucose-Capillary: 122 mg/dL — ABNORMAL HIGH (ref 65–99)

## 2017-11-26 LAB — LACTATE DEHYDROGENASE: LDH: 234 U/L — AB (ref 98–192)

## 2017-11-26 SURGERY — EXCISION MASS
Anesthesia: Monitor Anesthesia Care | Site: Neck | Laterality: Left

## 2017-11-26 MED ORDER — MIDAZOLAM HCL 2 MG/2ML IJ SOLN
INTRAMUSCULAR | Status: AC
  Filled 2017-11-26: qty 2

## 2017-11-26 MED ORDER — DEXMEDETOMIDINE HCL IN NACL 200 MCG/50ML IV SOLN: INTRAVENOUS | Status: DC | PRN

## 2017-11-26 MED ORDER — PHENYLEPHRINE HCL 10 MG/ML IJ SOLN
INTRAVENOUS | Status: DC | PRN
  Administered 2017-11-26: 80 ug/min via INTRAVENOUS

## 2017-11-26 MED ORDER — FENTANYL CITRATE (PF) 250 MCG/5ML IJ SOLN
INTRAMUSCULAR | Status: AC
  Filled 2017-11-26: qty 5

## 2017-11-26 MED ORDER — WARFARIN SODIUM 5 MG PO TABS
5.0000 mg | ORAL_TABLET | Freq: Once | ORAL | Status: AC
  Administered 2017-11-26: 5 mg via ORAL
  Filled 2017-11-26: qty 1

## 2017-11-26 MED ORDER — CALCITRIOL 0.5 MCG PO CAPS
ORAL_CAPSULE | ORAL | Status: AC
  Filled 2017-11-26: qty 1

## 2017-11-26 MED ORDER — DARBEPOETIN ALFA 100 MCG/0.5ML IJ SOSY
PREFILLED_SYRINGE | INTRAMUSCULAR | Status: AC
  Administered 2017-11-26: 100 ug
  Filled 2017-11-26: qty 0.5

## 2017-11-26 MED ORDER — DEXMEDETOMIDINE HCL IN NACL 200 MCG/50ML IV SOLN
INTRAVENOUS | Status: DC | PRN
  Administered 2017-11-26: 0.7 ug/kg/h via INTRAVENOUS

## 2017-11-26 MED ORDER — 0.9 % SODIUM CHLORIDE (POUR BTL) OPTIME
TOPICAL | Status: DC | PRN
  Administered 2017-11-26: 1000 mL

## 2017-11-26 MED ORDER — PHENYLEPHRINE HCL 10 MG/ML IJ SOLN
INTRAMUSCULAR | Status: DC | PRN
  Administered 2017-11-26: 80 ug via INTRAVENOUS
  Administered 2017-11-26: 40 ug via INTRAVENOUS
  Administered 2017-11-26: 80 ug via INTRAVENOUS

## 2017-11-26 MED ORDER — FENTANYL CITRATE (PF) 100 MCG/2ML IJ SOLN
INTRAMUSCULAR | Status: DC | PRN
  Administered 2017-11-26 (×2): 25 ug via INTRAVENOUS

## 2017-11-26 MED ORDER — LIDOCAINE-EPINEPHRINE (PF) 1 %-1:200000 IJ SOLN
INTRAMUSCULAR | Status: DC | PRN
  Administered 2017-11-26: 12 mL

## 2017-11-26 MED ORDER — PROPOFOL 10 MG/ML IV BOLUS
INTRAVENOUS | Status: DC | PRN
  Administered 2017-11-26 (×2): 20 mg via INTRAVENOUS
  Administered 2017-11-26: 10 mg via INTRAVENOUS
  Administered 2017-11-26: 20 mg via INTRAVENOUS
  Administered 2017-11-26: 10 mg via INTRAVENOUS

## 2017-11-26 MED ORDER — THROMBIN 5000 UNITS EX SOLR
CUTANEOUS | Status: AC
  Filled 2017-11-26: qty 5000

## 2017-11-26 MED ORDER — PROPOFOL 10 MG/ML IV BOLUS
INTRAVENOUS | Status: AC
  Filled 2017-11-26: qty 20

## 2017-11-26 MED ORDER — ONDANSETRON HCL 4 MG/2ML IJ SOLN
INTRAMUSCULAR | Status: AC
  Filled 2017-11-26: qty 2

## 2017-11-26 MED ORDER — LIDOCAINE-EPINEPHRINE (PF) 1 %-1:200000 IJ SOLN
INTRAMUSCULAR | Status: AC
  Filled 2017-11-26: qty 30

## 2017-11-26 MED ORDER — DEXMEDETOMIDINE HCL IN NACL 200 MCG/50ML IV SOLN
INTRAVENOUS | Status: AC
  Filled 2017-11-26: qty 50

## 2017-11-26 MED ORDER — HEMOSTATIC AGENTS (NO CHARGE) OPTIME
TOPICAL | Status: DC | PRN
  Administered 2017-11-26: 1 via TOPICAL

## 2017-11-26 MED ORDER — VASOPRESSIN 20 UNIT/ML IV SOLN
INTRAVENOUS | Status: AC
  Filled 2017-11-26: qty 1

## 2017-11-26 SURGICAL SUPPLY — 44 items
BAG DECANTER FOR FLEXI CONT (MISCELLANEOUS) ×3 IMPLANT
BLADE CLIPPER SURG (BLADE) IMPLANT
BLADE SURG 15 STRL LF DISP TIS (BLADE) ×1 IMPLANT
BLADE SURG 15 STRL SS (BLADE) ×2
CANISTER SUCT 3000ML PPV (MISCELLANEOUS) IMPLANT
CLOSURE WOUND 1/2 X4 (GAUZE/BANDAGES/DRESSINGS)
CONT SPEC 4OZ CLIKSEAL STRL BL (MISCELLANEOUS) ×3 IMPLANT
CORD BIPOLAR FORCEPS 12FT (ELECTRODE) ×3 IMPLANT
DECANTER SPIKE VIAL GLASS SM (MISCELLANEOUS) ×3 IMPLANT
DERMABOND ADVANCED (GAUZE/BANDAGES/DRESSINGS)
DERMABOND ADVANCED .7 DNX12 (GAUZE/BANDAGES/DRESSINGS) IMPLANT
DRAIN CHANNEL 15F RND FF W/TCR (WOUND CARE) ×3 IMPLANT
DRAPE LAPAROTOMY 100X72 PEDS (DRAPES) IMPLANT
DRAPE U-SHAPE 76X120 STRL (DRAPES) IMPLANT
DRSG MEPILEX BORDER 4X8 (GAUZE/BANDAGES/DRESSINGS) ×3 IMPLANT
DRSG TEGADERM 2-3/8X2-3/4 SM (GAUZE/BANDAGES/DRESSINGS) IMPLANT
ELECT CAUTERY BLADE 6.4 (BLADE) IMPLANT
ELECT COATED BLADE 2.86 ST (ELECTRODE) IMPLANT
ELECT NEEDLE BLADE 2-5/6 (NEEDLE) IMPLANT
ELECT REM PT RETURN 9FT ADLT (ELECTROSURGICAL) ×3
ELECTRODE REM PT RTRN 9FT ADLT (ELECTROSURGICAL) ×1 IMPLANT
GAUZE SPONGE 4X4 12PLY STRL LF (GAUZE/BANDAGES/DRESSINGS) ×3 IMPLANT
GAUZE SPONGE 4X4 16PLY XRAY LF (GAUZE/BANDAGES/DRESSINGS) ×9 IMPLANT
GAUZE XEROFORM 1X8 LF (GAUZE/BANDAGES/DRESSINGS) ×3 IMPLANT
GLOVE BIO SURGEON STRL SZ 6.5 (GLOVE) ×8 IMPLANT
GLOVE BIO SURGEONS STRL SZ 6.5 (GLOVE) ×4
GOWN STRL REUS W/ TWL LRG LVL3 (GOWN DISPOSABLE) ×3 IMPLANT
GOWN STRL REUS W/TWL LRG LVL3 (GOWN DISPOSABLE) ×6
HEMOSTAT SPONGE AVITENE ULTRA (HEMOSTASIS) ×3 IMPLANT
KIT BASIN OR (CUSTOM PROCEDURE TRAY) ×3 IMPLANT
NEEDLE HYPO 25GX1X1/2 BEV (NEEDLE) ×3 IMPLANT
NS IRRIG 1000ML POUR BTL (IV SOLUTION) ×3 IMPLANT
PACK SURGICAL SETUP 50X90 (CUSTOM PROCEDURE TRAY) ×3 IMPLANT
PENCIL BUTTON HOLSTER BLD 10FT (ELECTRODE) ×3 IMPLANT
SPONGE LAP 18X18 X RAY DECT (DISPOSABLE) ×3 IMPLANT
STRIP CLOSURE SKIN 1/2X4 (GAUZE/BANDAGES/DRESSINGS) IMPLANT
SUT MNCRL AB 3-0 PS2 18 (SUTURE) ×3 IMPLANT
SUT MNCRL AB 4-0 PS2 18 (SUTURE) ×6 IMPLANT
SYR BULB 3OZ (MISCELLANEOUS) ×3 IMPLANT
SYR CONTROL 10ML LL (SYRINGE) ×3 IMPLANT
TOWEL OR 17X24 6PK STRL BLUE (TOWEL DISPOSABLE) ×3 IMPLANT
TUBE CONNECTING 12'X1/4 (SUCTIONS) ×1
TUBE CONNECTING 12X1/4 (SUCTIONS) ×2 IMPLANT
YANKAUER SUCT BULB TIP NO VENT (SUCTIONS) ×3 IMPLANT

## 2017-11-26 NOTE — Interval H&P Note (Signed)
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 After consideration of risks, benefits and other options for treatment, the patient has consented to  Procedure(s): EXPLORATION AND EXCISION OF LEFT NECK (Left) as a surgical intervention .  [name] history has been reviewed, patient examined, no change in status, stable for surgery.  I have reviewed the patient's chart and labs.  Questions were answered to the patient's satisfaction.     [name]

## 2017-11-26 NOTE — Transfer of Care (Signed)
Immediate Anesthesia Transfer of Care Note  Patient: Trevor [name]  Procedure(s) Performed: EXPLORATION AND EXCISION OF LEFT NECK (Left Neck)  Patient Location: PACU  Anesthesia Type:MAC  Level of Consciousness: awake, alert  and oriented  Airway & Oxygen Therapy: Patient Spontanous Breathing and Patient connected to nasal cannula oxygen  Post-op Assessment: Report given to RN, Post -op Vital signs reviewed and stable and Patient moving all extremities X 4  Post vital signs: Reviewed and stable  Last Vitals:  Vitals:   11/26/17 0400 11/26/17 0919  BP:    Pulse:    Resp: 14   Temp: 36.6 C (!) (P) 36.1 C  SpO2: 95%     Last Pain:  Vitals:   11/26/17 0400  TempSrc: Oral  PainSc:       Patients Stated Pain Goal: 3 (92/42/68 3419)  Complications: No apparent anesthesia complications

## 2017-11-26 NOTE — Anesthesia Preprocedure Evaluation (Signed)
Anesthesia Evaluation  Patient identified by MRN, date of birth, ID band Patient awake    Reviewed: NPO status , Patient's Chart, lab work & pertinent test results, Unable to perform ROS - Chart review only  History of Anesthesia Complications Negative for: history of anesthetic complications  Airway Mallampati: II  TM Distance: >3 FB Neck ROM: Full    Dental  (+) Chipped, Missing, Dental Advisory Given   Pulmonary sleep apnea , COPD,  COPD inhaler,    breath sounds clear to auscultation       Cardiovascular + CAD, + CABG and +CHF (LVAD Heartmate II)  + dysrhythmias Atrial Fibrillation + pacemaker + Cardiac Defibrillator + Valvular Problems/Murmurs (s/p TAVR, s/p TVR)  Rhythm:Regular  LVAD:  Flow 5 liters/min, speed 9600, power 5.9, PI 3.6  '18 ECHO: EF 20-25%, mild MR, s/p TV repair   Neuro/Psych negative neurological ROS     GI/Hepatic Neg liver ROS, GERD  Medicated and Controlled,GI bleed: octreotide   Endo/Other  diabetes, Insulin DependentHypothyroidism   Renal/GU ESRF and DialysisRenal disease (dialyzed yesterday, K+ 3.6)     Musculoskeletal   Abdominal   Peds  Hematology Coumadin: INR 1.89   Anesthesia Other Findings   Reproductive/Obstetrics                             Anesthesia Physical Anesthesia Plan  ASA: III  Anesthesia Plan: MAC   Post-op Pain Management:    Induction: Intravenous  PONV Risk Score and Plan: 1 and Treatment may vary due to age or medical condition  Airway Management Planned: Nasal Cannula  Additional Equipment:   Intra-op Plan:   Post-operative Plan:   Informed Consent: I have reviewed the patients History and Physical, chart, labs and discussed the procedure including the risks, benefits and alternatives for the proposed anesthesia with the patient or authorized representative who has indicated his/her understanding and acceptance.   Dental  advisory given  Plan Discussed with: CRNA and Surgeon  Anesthesia Plan Comments:         Anesthesia Quick Evaluation

## 2017-11-26 NOTE — Progress Notes (Signed)
LVAD Coordinator Rounding Note:  Admitted1/15/2019 for scheduled inpatient procedure/heparin bridging.S/P excision of squamous cell CA x2 for neck and right leg on 11/20/2017.  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 Darcey Nora under Destination Therapy criteria.  Vital signs: HR:67AFib Doppler Pressure:72 Automatic YH:CWCBJS to obtain O2 Sat: 95% on RA Wt in lbs:179>177>182>187>183>183lbs  LVAD interrogation reveals:  Speed:9600 Flow:6.2 Power: 6.5w PI:3.8 Alarms: none Events:36 PI events overnight Fixed speed: 9600 Low speed limit: 9000  Drive Line: Sorbaview dressing dry and intact;Anchor intact.Weekly dressing changesto be done by VAD Coordinator. Next change: 11/27/16  Infection: Augmentin started 11/25/17 for neck wound  Labs:  INR trend:1.90>1.89>1.42>1.33>2.34>1.81 (warfarin held 1/21 due to neck bleeding)>1.4  Anticoagulation Plan: -INR Goal: 2.0 - 2.5.   Device: -Medtronic single lead -Therapies: on - last check: 11/24/2016  Plan/Recommendations:   1.Please page VAD coordinator for equipment issues or patient concerns 2. Will accompany patient to OR for evacuation of neck hematoma  Balinda Quails RN, VAD Coordinator 24/7 pager [phone]

## 2017-11-26 NOTE — Op Note (Addendum)
DATE OF OPERATION: 11/26/2017  LOCATION: [facility] Operating Room Inpatient  PREOPERATIVE DIAGNOSIS: Squamous cell carcinoma of neck with close margin and hematoma  POSTOPERATIVE DIAGNOSIS: Same  PROCEDURE: Evacuation of neck hematoma 2 x 6 cm  SURGEON: Claire Sanger Dillingham, DO  EBL: 5 cc  CONDITION: Stable  COMPLICATIONS: None  INDICATION: The patient, Kolt, is a 72 y.o. male born on 1946/03/28, is here for treatment of his neck that underwent resection of a SCC last week.  The deep margin is close and he had a hematoma.     PROCEDURE DETAILS:  The patient was seen prior to surgery and marked.  The IV antibiotics were given. The patient was taken to the operating room and given a anesthetic. A standard time out was performed and all information was confirmed by those in the room. SCD was placed on the left leg.   The neck was prepped and draped in the usual sterile fashion. Local was injected at the left neck site for intraoperative pain control.  The sutures were removed.  There was a small hematoma.  There was no active bleeding noted.  The area was irrigated with antibiotic solution and saline.  Hemostasis was achieved with bipolar for what was irritated with the evacuation and irrigation.  The area was monitored for any bleeding.  A #15 blade was placed and secured with a 4-0 Silk.  The thrombin strip was placed and the incision was debrided.  The 3-0 and 4-0 Monocryl was used to close the incision with vertical mattress sutures.  A specimen was not possible due to the base of the area and the risk of more bleeding.  This had been discussed with the patient prior to surgery.   The patient was allowed to wake up and taken to recovery room in stable condition at the end of the case. The family was notified at the end of the case.

## 2017-11-26 NOTE — Progress Notes (Signed)
VAD Coordinator Procedure Note:   Patient underwent evacuation of neck heamotma. Hemodynamics and VAD parameters monitored by me throughout the procedure. Blood pressure was monitored with automatic cuff as the doppler was reflecting the modified systolic. All of his primary and back up equipment was present during the procedure as well as myself to assist with management.   Time/flow/speed/PI/Power/BP 0811/5.06/9599/3.8/6.5/102/78 (82) 0820/5.05/9599/3.7/6.3/83/72 (78) 0825/5.04/9199/2.8/5.8- started NEO per CRNA for PI drop to 1.2 and PI events 0830/6.11/9598/2.7/6.5 0835/5.05/9599/2.2/6.4/82/67 (74) 0845/5.02/9599/3.7/6.3/109/82 (91) 0852/5.04/9599/3.6/6.3/92/75 (83) 0900/5.04/9599/3.5/6.5/101/80 (90) 0904/6.11/9598/3.0/6.6/100/88 (94)  Patient tolerated the procedure well. VAD parameters and hemodynamics were stable throughout the case. Dr Aundra Dubin and Darrick Grinder NP updated.  Patient Disposition: 2H25 post-PACU  Balinda Quails, RN [place] Coordinator   Office: [phone] 24/7 [place] Pager: [phone]

## 2017-11-26 NOTE — Progress Notes (Signed)
[facility] Progress Note   Subjective:   Pt is postop , groggy but no new c/o, no cough or SOB.   Objective Vitals:   11/26/17 1016 11/26/17 1100 11/26/17 1208 11/26/17 1245  BP: (!) 60/46 98/72    Pulse:  (!) 41  83  Resp: 15 12    Temp: 98.5 F (36.9 C)  98.5 F (36.9 C)   TempSrc: Oral  Oral   SpO2: 97% 100%  100%  Weight:      Height:       Physical Exam General:NAD, pleasant male Neck: swelling, L drain and bandage in place Chest wall:  IJ PC in place. Heart:mechanical heart sounds, LVAD hum Lungs: CTA bilat Abdomen:soft, NTND Extremities:no LE edema, bandage on R calf Dialysis Access: LU AVF +b/t   Filed Weights   11/24/17 1805 11/25/17 0356 11/26/17 0500  Weight: 82.7 kg (182 lb 5.1 oz) 83.1 kg (183 lb 3.2 oz) 83.2 kg (183 lb 8 oz)    Intake/Output Summary (Last 24 hours) at 11/26/2017 1449 Last data filed at 11/26/2017 1300 Gross per 24 hour  Intake 840 ml  Output 240 ml  Net 600 ml    Additional Objective Labs: Basic Metabolic Panel: Recent Labs  Lab 11/24/17 0224 11/25/17 0345 11/26/17 0254  NA 127* 130* 129*  K 4.7 4.5 4.8  CL 91* 94* 92*  CO2 24 25 24   GLUCOSE 177* 185* 166*  BUN 50* 27* 42*  CREATININE 6.51* 4.28* 5.75*  CALCIUM 8.3* 8.3* 8.5*  PHOS 5.4* 3.8 4.0   Liver Function Tests: Recent Labs  Lab 11/24/17 0224 11/25/17 0345 11/26/17 0254  ALBUMIN 3.5 3.4* 3.4*   No results for input(s): LIPASE, AMYLASE in the last 168 hours. CBC: Recent Labs  Lab 11/24/17 0800 11/25/17 0345 11/25/17 1507 11/25/17 1929 11/26/17 0254  WBC 10.1 9.4 10.4 10.7* 9.9  NEUTROABS 7.4 7.5  --   --  7.2  HGB 8.7* 8.6* 8.2* 7.8* 8.2*  HCT 26.6* 27.0* 25.1* 24.3* 25.9*  MCV 92.7 94.4 94.4 94.2 94.9  PLT 132* 138* 139* 134* 144*    CBG: Recent Labs  Lab 11/25/17 1206 11/25/17 1819 11/25/17 2158 11/26/17 0922 11/26/17 1139  GLUCAP 195* 106* 131* 167* 161*    Lab Results  Component Value Date   INR 1.40 11/26/2017    INR 1.56 11/25/2017   INR 1.81 11/25/2017    Medications: . sodium chloride 10 mL/hr at 11/20/17 0810   . amoxicillin-clavulanate  1 tablet Oral Q24H  . atorvastatin  40 mg Oral q1800  . budesonide  0.5 mg Inhalation BID  . busPIRone  5 mg Oral BID  . calcitRIOL  0.5 mcg Oral Q M,W,F-HD  . darbepoetin (ARANESP) injection - DIALYSIS  100 mcg Intravenous Q Wed-HD  . docusate sodium  100 mg Oral Daily  . ferric citrate  420 mg Oral TID WC  . gabapentin  300 mg Oral BID  . insulin aspart  0-15 Units Subcutaneous TID WC  . insulin aspart  0-5 Units Subcutaneous QHS  . insulin aspart  3 Units Subcutaneous TID WC  . levothyroxine  25 mcg Oral QAC breakfast  . lidocaine-EPINEPHrine  20 mL Intradermal Once  . multivitamin  1 tablet Oral QHS  . pantoprazole  40 mg Oral Daily  . sildenafil  40 mg Oral TID  . thrombin recombinant  5,000 Units Topical Once  . Warfarin - Pharmacist Dosing Inpatient   Does not apply q1800    Dialysis  Orders: [facility] - [facility] 4.5h   82kg   2K/ 2Ca   LU AVF/ TDC   Hep 5800 Mircera 200 mcg IV q2wks, last on 11/05/17 venofer 50mg  IV qwk, last 11/12/17 Calcitriol 0.7mcg PO qHD TID  Last Labs: 11/12/17 Hgb 12.2, TSAT 23%, K 4.4, Ca 8.9, P 6.7, PTH 245, Albumin 4.2  Assessment: 1. SCCa L neck and RLE- excision by [name] on 1/17 2. ESRD -  continue per regular MWFS schedule. K ok. HOLD heparin for now with bleeding issues.  3. Hypertension/volume  - BP soft at baseline, close to dry wt, no ^vol on exam 4. Anemia  - Hgb trending down, continue to monitor. Plan to transfuse if Hgb <8.  Aranesp 137mcg qwk (Wed) started on 1/16. 5. Secondary Hyperparathyroidism -  Ca 8.3, using 2.5K bath today. P 5.4. Continue binders, VDRA. 6. Nutrition - Alb 3.5. Renal/Carb modified diet w/Fluid restriction. Renavite. 7. Chronic systolic/diastolic CHF: ischemic cardiomyopathy, EF 20-25% w/LVAD, Medtronic ICD. - on coumadin - per cardio/LVAD team 8. CAD s/p CABG - stable, per  cardio 9. A fib - on coumadin - per cardio 10. H/o GIB - per primary 11. H/o TAVR - stable, per primary 12. DM - stable, per primary  P - MWFS HD, esa, HD today   Kelly Splinter MD [place] pager [phone]   11/26/2017, 2:49 PM

## 2017-11-26 NOTE — Progress Notes (Signed)
[place] for IV Heparin >>Coumadin Indication: LVAD  No Active Allergies  Patient Measurements: Height: 5\' 5"  (165.1 cm) Weight: 183 lb 8 oz (83.2 kg) IBW/kg (Calculated) : 61.5 Heparin Dosing Weight: 78 kg  Vital Signs: Temp: 98.2 F (36.8 C) (01/23 1546) Temp Source: Oral (01/23 1546) BP: 98/72 (01/23 1100) Pulse Rate: 83 (01/23 1245)  Labs: Recent Labs    11/24/17 0224  11/25/17 0345 11/25/17 1507 11/25/17 1929 11/26/17 0254  HGB  --    < > 8.6* 8.2* 7.8* 8.2*  HCT  --    < > 27.0* 25.1* 24.3* 25.9*  PLT  --    < > 138* 139* 134* 144*  LABPROT 25.4*  --  20.9* 18.6*  --  17.1*  INR 2.34  --  1.81 1.56  --  1.40  CREATININE 6.51*  --  4.28*  --   --  5.75*   < > = values in this interval not displayed.    Estimated Creatinine Clearance: 11.5 mL/min (A) (by C-G formula based on SCr of 5.75 mg/dL (H)).  Assessment: 72 yo male with LVAD, admitted for IV heparin bridge before dermatologic surgery 1/17.  PTA Coumadin dose 7.5 mg daily, last dose taken 1/13 per patient report.  Pt is now s/p excision of left neck and right leg > with neck hematoma 1/21> drained in room by plastic surgeon - still oozing in pm so held warfarin and wound repacked> improved 1/22> dressing again changed and evacuated hematoma by plastic surgeon > repacked again 1/22 pm Warfarin held again and pt back to OR 1/23 with oozing sites cauterized.  H/h stable Ok to restart warfarin - hold on restarting heparin until 1/24 and then keep HL goal 0.25ish - discussion with PVT  Keep INR on low end range.   INR 1.4 hgb now down 10>8.9>8.6>8.2 but bleeding better and h/h low but ok.  Goal of Therapy:  INR goal 2-2.5 HL 0.25ish Monitor platelets by anticoagulation protocol: Yes   Plan:  Warfarin 5mg  tonight  Daily INR  [name] Pharm.D. CPP, BCPS Clinical Pharmacist [phone] 11/26/2017 4:00 PM

## 2017-11-26 NOTE — Anesthesia Postprocedure Evaluation (Signed)
Anesthesia Post Note  Patient: Trevor [name]  Procedure(s) Performed: EXPLORATION AND EXCISION OF LEFT NECK (Left Neck)     Patient location during evaluation: PACU Anesthesia Type: MAC Level of consciousness: awake and alert Pain management: pain level controlled Vital Signs Assessment: post-procedure vital signs reviewed and stable Respiratory status: spontaneous breathing, nonlabored ventilation, respiratory function stable and patient connected to nasal cannula oxygen Cardiovascular status: stable and blood pressure returned to baseline Postop Assessment: no apparent nausea or vomiting Anesthetic complications: no    Last Vitals:  Vitals:   11/26/17 1245 11/26/17 1546  BP:    Pulse: 83   Resp:    Temp:  36.8 C  SpO2: 100%     Last Pain:  Vitals:   11/26/17 1546  TempSrc: Oral  PainSc:                  Mertis Mosher

## 2017-11-26 NOTE — Progress Notes (Signed)
Patient ID: Trevor [name], male   DOB: [birth year], 72 y.o.   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  Path shows extension of squamous cell CA to margins at neck site.  He developed a hematoma at neck surgery site and went back for evacuation of hematoma this morning.  Hgb fairly stable today at 8.2, INR 1.4, LDH 234. MAP 82.   He still is coughing up blood at times.  He feels fluid dripping into his throat.  He has not had epistaxis.    LVAD INTERROGATION:  HeartMate II LVAD:  Flow 5.6 liters/min, speed 9600, power 6.3, PI 4.7.  Multiple PI events.   Objective:    Vital Signs:   Temp:  [97 F (36.1 C)-98.5 F (36.9 C)] 98.5 F (36.9 C) (01/23 1016) Pulse Rate:  [48-102] 48 (01/23 0953) Resp:  [10-20] 15 (01/23 1016) BP: (49-121)/(23-110) 60/46 (01/23 1016) SpO2:  [95 %-99 %] 99 % (01/23 0953) Weight:  [183 lb 8 oz (83.2 kg)] 183 lb 8 oz (83.2 kg) (01/23 0500) Last BM Date: 11/25/17 Mean arterial Pressure 82  Intake/Output:   Intake/Output Summary (Last 24 hours) at 11/26/2017 1127 Last data filed at 11/26/2017 0917 Gross per 24 hour  Intake 720 ml  Output 235 ml  Net 485 ml     Physical Exam    GENERAL: Well appearing this am. NAD.  HEENT: Normal. NECK: Supple, JVP 8-9 cm. Dressing covering left neck surgical site.  CARDIAC:  Mechanical heart sounds with LVAD hum present.  LUNGS:  CTAB, normal effort.  ABDOMEN:  NT, ND, no HSM. No bruits or masses. +BS  LVAD exit site: Well-healed and incorporated. Dressing dry and intact. No erythema or drainage. Stabilization device present and accurately applied. Driveline dressing changed daily per sterile technique. EXTREMITIES:  Warm and dry. No cyanosis, clubbing, rash, or edema.  NEUROLOGIC:  Alert & oriented x 3. Cranial nerves grossly intact. Moves all 4 extremities w/o difficulty. Affect pleasant   SKIN: Ecchymosis upper  chest/left shoulder.    Telemetry   Atrial fibrillation 80s with PVCs, Personally reviewed   Labs   Basic Metabolic Panel: Recent Labs  Lab 11/20/17 0240 11/22/17 0131 11/23/17 0932 11/24/17 0224 11/25/17 0345 11/26/17 0254  NA 131* 132* 129* 127* 130* 129*  K 3.6 4.1 4.1 4.7 4.5 4.8  CL 93* 94* 91* 91* 94* 92*  CO2 26 25 24 24 25 24   GLUCOSE 243* 122* 162* 177* 185* 166*  BUN 25* 21* 39* 50* 27* 42*  CREATININE 4.32* 3.78* 5.73* 6.51* 4.28* 5.75*  CALCIUM 8.3* 8.8* 8.4* 8.3* 8.3* 8.5*  PHOS 4.6  --  5.4* 5.4* 3.8 4.0    Liver Function Tests: Recent Labs  Lab 11/20/17 0240 11/23/17 0652 11/24/17 0224 11/25/17 0345 11/26/17 0254  ALBUMIN 3.3* 3.5 3.5 3.4* 3.4*   No results for input(s): LIPASE, AMYLASE in the last 168 hours. No results for input(s): AMMONIA in the last 168 hours.  CBC: Recent Labs  Lab 11/24/17 0800 11/25/17 0345 11/25/17 1507 11/25/17 1929 11/26/17 0254  WBC 10.1 9.4 10.4 10.7* 9.9  NEUTROABS 7.4 7.5  --   --  7.2  HGB 8.7* 8.6* 8.2* 7.8* 8.2*  HCT 26.6* 27.0* 25.1* 24.3* 25.9*  MCV 92.7 94.4 94.4 94.2 94.9  PLT 132* 138* 139* 134* 144*    INR: Recent Labs  Lab 11/23/17  3532 11/24/17 0224 11/25/17 0345 11/25/17 1507 11/26/17 0254  INR 2.25 2.34 1.81 1.56 1.40    Other results:     Imaging   Ct Soft Tissue Neck W Contrast  Result Date: 11/25/2017 CLINICAL DATA:  Left neck mass.  Recent excision of skin cancer. EXAM: CT NECK WITH CONTRAST TECHNIQUE: Multidetector CT imaging of the neck was performed using the standard protocol following the bolus administration of intravenous contrast. CONTRAST:  <See Chart> ISOVUE-300 IOPAMIDOL (ISOVUE-300) INJECTION 61%, <See Chart> ISOVUE-300 IOPAMIDOL (ISOVUE-300) INJECTION 61% COMPARISON:  None. FINDINGS: Pharynx and larynx: Mild esophageal thickening is present. No focal mucosal or submucosal lesion. The tongue base is within normal limits. Vocal cords are midline and symmetric.  Oropharynx is within limits. Hypopharynx is unremarkable. The epiglottis is within normal limits. Salivary glands: The submandibular and parotid glands are within normal limits bilaterally. Thyroid: Normal. Lymph nodes: Subcentimeter left supraclavicular nodes are likely reactive. No significant adenopathy is present. Vascular: Atherosclerotic calcifications are present at the carotid bifurcations bilaterally. No definite stenosis of greater than 50%. Limited intracranial: Unremarkable. Visualized orbits: Within normal limits. Mastoids and visualized paranasal sinuses: The paranasal sinuses and mastoid air cells are clear. Skeleton: Degenerative changes are present in the cervical spine. Grade 1 anterolisthesis is present at C3-4. Chronic loss disc space with uncovertebral spurring bilaterally at C4-5, C5-6, and to lesser extent C6-7. Upper chest: The lung apices are clear. Median sternotomy is again noted. Other: A left neck is mixed density collection is noted posterior to the left sternocleidomastoid muscle. The collection measures 9.8 x 3.7 x 3.4 cm. There is gas within the collection. High-density material near the superior aspect of the collection is concerning for recurrent hemorrhage. This creates mass effect in the subcutaneous space. Some mass effect on the left sternocleidomastoid muscle. IMPRESSION: 1. 9.8 cm mixed density left subcutaneous hyperdense collection compatible with acute hematoma. 2. Active extravasation into the superior aspect of the collection compatible with arterial hemorrhage. 3. Superficial mass effect on the left sternocleidomastoid muscle. 4. Extensive atherosclerotic disease. 5. Atherosclerosis. These results were called by telephone at the time of interpretation on 11/25/2017 at 2:25 pm to [name] , who verbally acknowledged these results. Electronically Signed   By: San Morelle M.D.   On: 11/25/2017 14:31   Ct Chest W Contrast  Result Date: 11/25/2017 CLINICAL  DATA:  Inpatient. Hemoptysis. Status post left neck mass surgery. LVAD. EXAM: CT CHEST WITH CONTRAST TECHNIQUE: Multidetector CT imaging of the chest was performed during intravenous contrast administration. CONTRAST:  75 cc ISOVUE-300 IOPAMIDOL (ISOVUE-300) INJECTION 61%, <See Chart> ISOVUE-300 IOPAMIDOL (ISOVUE-300) INJECTION 61% COMPARISON:  Chest radiograph from one day prior. 12/03/2016 chest CT. FINDINGS: Cardiovascular: Mild cardiomegaly. Left ventricular assist device appears well positioned with the inflow catheter in place at the left ventricular apex and outflow catheter anastomosed to the ascending aorta. No significant pericardial fluid/thickening. Aortic valve prosthesis is in place. Tricuspid valve prosthesis is in place. Left main and 3 vessel coronary atherosclerosis status post CABG. Atherosclerotic nonaneurysmal thoracic aorta. Dilated main pulmonary artery (4.1 cm node), stable. No central pulmonary emboli. Single lead right subclavian ICD is noted with lead tip in the right ventricular apex. Left internal jugular central venous catheter terminates at the cavoatrial junction. Mediastinum/Nodes: Stable hypodense 1.3 cm anterior right thyroid lobe nodule. Small amount of retained oral contrast in the mid to lower thoracic esophagus. No pathologically enlarged axillary, mediastinal or hilar lymph nodes. No mediastinal fluid collections. No pneumomediastinum. Lungs/Pleura: No pneumothorax. No pleural effusion.  No acute consolidative airspace disease or lung masses. Mild patchy tree-in-bud opacity in the right lower lobe is unchanged since 12/03/2016 chest CT, compatible with benign postinflammatory opacity. No significant pulmonary nodules. Upper abdomen: Contrast reflux is seen into the IVC and hepatic veins. Musculoskeletal: No aggressive appearing focal osseous lesions. Intact sternotomy wires. Marked thoracic spondylosis. IMPRESSION: 1. No active pulmonary disease. Stable chronic mild  postinflammatory tree-in-bud opacities in right lower lobe. 2. Cardiomegaly. Contrast reflux into the IVC and hepatic veins, indicative of elevated central venous pressures/right heart failure. LVAD appears well-positioned. No mediastinal fluid collections. 3. Stable dilated main pulmonary artery compatible with chronic pulmonary arterial hypertension. 4. Small amount of oral contrast in the mid to lower thoracic esophagus, suggesting esophageal dysmotility and/or gastroesophageal reflux. Aortic Atherosclerosis (ICD10-I70.0). Electronically Signed   By: Ilona Sorrel M.D.   On: 11/25/2017 14:12     Medications:     Scheduled Medications: . amoxicillin-clavulanate  1 tablet Oral Q24H  . atorvastatin  40 mg Oral q1800  . budesonide  0.5 mg Inhalation BID  . busPIRone  5 mg Oral BID  . calcitRIOL  0.5 mcg Oral Q M,W,F-HD  . darbepoetin (ARANESP) injection - DIALYSIS  100 mcg Intravenous Q Wed-HD  . docusate sodium  100 mg Oral Daily  . ferric citrate  420 mg Oral TID WC  . gabapentin  300 mg Oral BID  . insulin aspart  0-15 Units Subcutaneous TID WC  . insulin aspart  0-5 Units Subcutaneous QHS  . insulin aspart  3 Units Subcutaneous TID WC  . levothyroxine  25 mcg Oral QAC breakfast  . lidocaine-EPINEPHrine  20 mL Intradermal Once  . multivitamin  1 tablet Oral QHS  . pantoprazole  40 mg Oral Daily  . sildenafil  40 mg Oral TID  . thrombin recombinant  5,000 Units Topical Once  . Warfarin - Pharmacist Dosing Inpatient   Does not apply q1800    Infusions: . sodium chloride 10 mL/hr at 11/20/17 0810    PRN Medications: acetaminophen, albuterol, ferric citrate, guaiFENesin, lidocaine, ondansetron (ZOFRAN) IV, oxyCODONE, traMADol, traZODone   Patient Profile   BREWER HITCHMAN is a 72 y.o. male  history of CAD s/p CABG x 4 2010, OSA, AS with TAVR 2015, ESRD, DM2, paroxysmal atrial fibrillation, asthma, and ischemic cardiomyopathy with Medtronic ICD. 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 Post  op period complicated by ARF leading to ESRD and need for chronic dialysis.   Admitted 11/18/17 for heparin bridging for Excision of SCC x 2.   Assessment/Plan:    1. 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  Margins not clear at neck.  He went back to the OR today for evacuation of hematoma.  Hemoglobin stable today at 8.2.  - Continue pressure dressing.  - Keep INR on the low end of goal (2-2.5), would restart coumadin tonight.  Will not use heparin gtt until tomorrow.  - Will keep active T&S. Transfuse HGb < 8.0 - Further steps in terms of neck site cancer per surgery.   - Continue Augmentin bid for surgical site.  2. Chronic systolic CHF: Ischemic cardiomyopathy, EF 20-25% with RV dysfunction on 2/18 echo pre-LVAD. 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 Medtronic ICD. Volume status looks stable on exam with HD. MAP stable today.  - Continue sildenafil 40 TID for RV failure - warfarin goal 2-2.5.  3. CAD: s/p CABG.  No evidence for ischemia.     4. ESRD: HD needs to be  done at bedside with VAD trained personnel present. Due for HD again today.  5. Atrial fibrillation: Chronic. 6. RV failure: Continue Revatio as above.  7. OMV:[facility] stable 70s-80s  8. Anemia: Baseline anemia of renal disease/chronic disease but recent bleeding from colonic AVMs.  - Hgb down 10.0-> 8.6 -> 8.2 with surgical site bleeding.  9. 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 Off ASA with goal INR 2-2.5.  - Continue octreotide as outpatient.  10. H/o TAVR:  Stable on most recent echo.  11. Hemoptysis: ?Bleeding site.  No obvious mouth lesion.  [name] has evaluated neck surgical site, unlikely to be from there.  He says he coughs up the blood.  CT chest did not show lesion that would cause hemoptysis. GI has seen, do not think GI source.  He feels dripping in the back of his throat, possibly blood is coming from sinuses/upper airways.    VAD interrogated personally.  Parameters stable.  Length of Stay: 8  Loralie Champagne, MD 11/26/2017, 11:27 AM  VAD Team --- VAD ISSUES ONLY--- Pager [phone] (7am - 7am)  Advanced Heart Failure Team  Pager [phone] (M-F; 7a - 4p)  Please contact [facility] for night-coverage after hours (4p -7a ) and weekends on [email or web address]

## 2017-11-26 NOTE — Progress Notes (Signed)
Pt still experiencing oozing from neck incision and has ecchymosis on the left ear, neck, right and left chest and underneath chin.  He has soaked 8- 4x4 gaze pads since his dressing change at 2000/2230 last night. New dressing placed at 0200 this morning. Pts 0200 doppler pressure=92 still having frequent PVCs No alarms/ drastic changes in VAD parameters. Pt alert and oriented with complaints of discomfort at surgical site- 5mg  Oxycodone given at 2248 . Will continue to monitor.

## 2017-11-26 NOTE — Care Management Note (Signed)
Case Management Note  Patient Details  Name: Trevor [name] MRN: [id number] Date of Birth: 07-Feb-1946  Subjective/Objective:   Pt is now s/p excision of squamous cell CA from  Neck and leg                 Action/Plan:   PTA from home independent.  Pt has hx of LVAD implantation 12/2016.  CM will continue to follow for discharge needs   Expected Discharge Date:  11/21/17               Expected Discharge Plan:  Home/Self Care  In-House Referral:     Discharge planning Services  CM Consult  Post Acute Care Choice:    Choice offered to:     DME Arranged:    DME Agency:     HH Arranged:    HH Agency:     Status of Service:     If discussed at [employer], dates discussed:    Additional Comments: 11/26/2017 Pt developed Hematoma post procedure - went to OR today for second attempt at evacuation of hematoma on his neck.  [name], RN 11/26/2017, 2:16 PM

## 2017-11-26 NOTE — Interval H&P Note (Signed)
History and Physical Interval Note:  11/26/2017 7:37 AM  Trevor [name]  has presented today for surgery, with the diagnosis of SQUAMOUS CELL CARCINOMA ON LEFT NECK  The various methods of treatment have been discussed with the patient and family. After consideration of risks, benefits and other options for treatment, the patient has consented to  Procedure(s): EXPLORATION AND EXCISION OF LEFT NECK (Left) as a surgical intervention .  [name] history has been reviewed, patient examined, no change in status, stable for surgery.  I have reviewed the patient's chart and labs.  Questions were answered to the patient's satisfaction.     Loel Lofty Dillingham

## 2017-11-27 ENCOUNTER — Encounter (HOSPITAL_COMMUNITY): Payer: Self-pay | Admitting: Plastic Surgery

## 2017-11-27 ENCOUNTER — Inpatient Hospital Stay (HOSPITAL_COMMUNITY): Admit: 2017-11-27 | Payer: Medicare Other

## 2017-11-27 LAB — LACTATE DEHYDROGENASE: LDH: 215 U/L — ABNORMAL HIGH (ref 98–192)

## 2017-11-27 LAB — CBC WITH DIFFERENTIAL/PLATELET
BASOS ABS: 0 10*3/uL (ref 0.0–0.1)
BASOS PCT: 0 %
Eosinophils Absolute: 0.2 10*3/uL (ref 0.0–0.7)
Eosinophils Relative: 2 %
HEMATOCRIT: 22 % — AB (ref 39.0–52.0)
Hemoglobin: 7.1 g/dL — ABNORMAL LOW (ref 13.0–17.0)
Lymphocytes Relative: 10 %
Lymphs Abs: 1.2 10*3/uL (ref 0.7–4.0)
MCH: 30.9 pg (ref 26.0–34.0)
MCHC: 32.3 g/dL (ref 30.0–36.0)
MCV: 95.7 fL (ref 78.0–100.0)
MONO ABS: 1.2 10*3/uL — AB (ref 0.1–1.0)
Monocytes Relative: 10 %
NEUTROS ABS: 9.1 10*3/uL — AB (ref 1.7–7.7)
NEUTROS PCT: 78 %
Platelets: 149 10*3/uL — ABNORMAL LOW (ref 150–400)
RBC: 2.3 MIL/uL — ABNORMAL LOW (ref 4.22–5.81)
RDW: 18.5 % — AB (ref 11.5–15.5)
WBC: 11.6 10*3/uL — ABNORMAL HIGH (ref 4.0–10.5)

## 2017-11-27 LAB — GLUCOSE, CAPILLARY
GLUCOSE-CAPILLARY: 157 mg/dL — AB (ref 65–99)
GLUCOSE-CAPILLARY: 160 mg/dL — AB (ref 65–99)
GLUCOSE-CAPILLARY: 225 mg/dL — AB (ref 65–99)
GLUCOSE-CAPILLARY: 109 mg/dL — AB (ref 65–99)
Glucose-Capillary: 142 mg/dL — ABNORMAL HIGH (ref 65–99)

## 2017-11-27 LAB — PROTIME-INR
INR: 1.33
PROTHROMBIN TIME: 16.4 s — AB (ref 11.4–15.2)

## 2017-11-27 LAB — RENAL FUNCTION PANEL
ALBUMIN: 2.9 g/dL — AB (ref 3.5–5.0)
Anion gap: 12 (ref 5–15)
BUN: 28 mg/dL — AB (ref 6–20)
CALCIUM: 8.2 mg/dL — AB (ref 8.9–10.3)
CO2: 25 mmol/L (ref 22–32)
Chloride: 95 mmol/L — ABNORMAL LOW (ref 101–111)
Creatinine, Ser: 4.17 mg/dL — ABNORMAL HIGH (ref 0.61–1.24)
GFR calc Af Amer: 15 mL/min — ABNORMAL LOW (ref >60)
GFR, EST NON AFRICAN AMERICAN: 13 mL/min — AB (ref >60)
Glucose, Bld: 163 mg/dL — ABNORMAL HIGH (ref 65–99)
PHOSPHORUS: 3.1 mg/dL (ref 2.5–4.6)
POTASSIUM: 4.2 mmol/L (ref 3.5–5.1)
SODIUM: 132 mmol/L — AB (ref 135–145)

## 2017-11-27 LAB — PREPARE RBC (CROSSMATCH)

## 2017-11-27 LAB — HEPARIN LEVEL (UNFRACTIONATED): Heparin Unfractionated: 0.14 IU/mL — ABNORMAL LOW (ref 0.30–0.70)

## 2017-11-27 MED ORDER — WARFARIN SODIUM 7.5 MG PO TABS
7.5000 mg | ORAL_TABLET | Freq: Once | ORAL | Status: AC
  Administered 2017-11-27: 7.5 mg via ORAL
  Filled 2017-11-27: qty 1

## 2017-11-27 MED ORDER — CHLORHEXIDINE GLUCONATE CLOTH 2 % EX PADS: 6.0000 | MEDICATED_PAD | CUTANEOUS | Status: DC

## 2017-11-27 MED ORDER — SODIUM CHLORIDE 0.9 % IV SOLN
Freq: Once | INTRAVENOUS | Status: AC
  Administered 2017-11-27: 08:00:00 via INTRAVENOUS

## 2017-11-27 MED ORDER — HEPARIN (PORCINE) IN NACL 100-0.45 UNIT/ML-% IJ SOLN
1350.0000 [IU]/h | INTRAMUSCULAR | Status: DC
  Administered 2017-11-27: 1000 [IU]/h via INTRAVENOUS
  Administered 2017-11-28: 1200 [IU]/h via INTRAVENOUS
  Administered 2017-11-29 (×2): 1250 [IU]/h via INTRAVENOUS
  Filled 2017-11-27 (×6): qty 250

## 2017-11-27 MED ORDER — SALINE SPRAY 0.65 % NA SOLN
1.0000 | NASAL | Status: DC | PRN
  Filled 2017-11-27: qty 44

## 2017-11-27 NOTE — Progress Notes (Signed)
1 Day Post-Op   Subjective/Chief Complaint: Reports he was very confused earlier this morning, but feeling much better and more alert and appropriate this afternoon.  Left neck without signs of recurrent hematoma and much softer to palpation. The left earlobe is also much softer. Patient notes sensation over the ear lobe is improved as well.  Neck JP drainage is serosanguinous and 14 ml.  Heparin at low level and warfarin resumed. INR 1.33 today Receiving prbc for Hgb 7.1  Objective: Vital signs in last 24 hours: Temp:  [97.8 F (36.6 C)-100.3 F (37.9 C)] 97.8 F (36.6 C) (01/24 1200) Pulse Rate:  [36-98] 80 (01/24 1200) Resp:  [12-25] 15 (01/24 1200) BP: (66-117)/(27-84) 86/71 (01/24 1200) SpO2:  [91 %-100 %] 96 % (01/24 1200) Weight:  [82.4 kg (181 lb 10.5 oz)-83.3 kg (183 lb 10.3 oz)] 83.3 kg (183 lb 10.3 oz) (01/24 0500) Last BM Date: 11/25/17  Intake/Output from previous day: 01/23 0701 - 01/24 0700 In: 1117.2 [P.O.:600; I.V.:517.2] Out: 1274 [Urine:250; Drains:14; Blood:10] Intake/Output this shift: Total I/O In: 734.2 [P.O.:360; I.V.:29.2; Blood:345] Out: 0     Lab Results:  Recent Labs    11/26/17 0254 11/27/17 0257  WBC 9.9 11.6*  HGB 8.2* 7.1*  HCT 25.9* 22.0*  PLT 144* 149*   BMET Recent Labs    11/26/17 0254 11/27/17 0257  NA 129* 132*  K 4.8 4.2  CL 92* 95*  CO2 24 25  GLUCOSE 166* 163*  BUN 42* 28*  CREATININE 5.75* 4.17*  CALCIUM 8.5* 8.2*   PT/INR Recent Labs    11/26/17 0254 11/27/17 0257  LABPROT 17.1* 16.4*  INR 1.40 1.33   ABG No results for input(s): PHART, HCO3 in the last 72 hours.  Invalid input(s): PCO2, PO2  Studies/Results: Ct Soft Tissue Neck W Contrast  Result Date: 11/25/2017 CLINICAL DATA:  Left neck mass.  Recent excision of skin cancer. EXAM: CT NECK WITH CONTRAST TECHNIQUE: Multidetector CT imaging of the neck was performed using the standard protocol following the bolus administration of intravenous contrast.  CONTRAST:  <See Chart> ISOVUE-300 IOPAMIDOL (ISOVUE-300) INJECTION 61%, <See Chart> ISOVUE-300 IOPAMIDOL (ISOVUE-300) INJECTION 61% COMPARISON:  None. FINDINGS: Pharynx and larynx: Mild esophageal thickening is present. No focal mucosal or submucosal lesion. The tongue base is within normal limits. Vocal cords are midline and symmetric. Oropharynx is within limits. Hypopharynx is unremarkable. The epiglottis is within normal limits. Salivary glands: The submandibular and parotid glands are within normal limits bilaterally. Thyroid: Normal. Lymph nodes: Subcentimeter left supraclavicular nodes are likely reactive. No significant adenopathy is present. Vascular: Atherosclerotic calcifications are present at the carotid bifurcations bilaterally. No definite stenosis of greater than 50%. Limited intracranial: Unremarkable. Visualized orbits: Within normal limits. Mastoids and visualized paranasal sinuses: The paranasal sinuses and mastoid air cells are clear. Skeleton: Degenerative changes are present in the cervical spine. Grade 1 anterolisthesis is present at C3-4. Chronic loss disc space with uncovertebral spurring bilaterally at C4-5, C5-6, and to lesser extent C6-7. Upper chest: The lung apices are clear. Median sternotomy is again noted. Other: A left neck is mixed density collection is noted posterior to the left sternocleidomastoid muscle. The collection measures 9.8 x 3.7 x 3.4 cm. There is gas within the collection. High-density material near the superior aspect of the collection is concerning for recurrent hemorrhage. This creates mass effect in the subcutaneous space. Some mass effect on the left sternocleidomastoid muscle. IMPRESSION: 1. 9.8 cm mixed density left subcutaneous hyperdense collection compatible with acute hematoma. 2.  Active extravasation into the superior aspect of the collection compatible with arterial hemorrhage. 3. Superficial mass effect on the left sternocleidomastoid muscle. 4.  Extensive atherosclerotic disease. 5. Atherosclerosis. These results were called by telephone at the time of interpretation on 11/25/2017 at 2:25 pm to [name] , who verbally acknowledged these results. Electronically Signed   By: San Morelle M.D.   On: 11/25/2017 14:31   Ct Chest W Contrast  Result Date: 11/25/2017 CLINICAL DATA:  Inpatient. Hemoptysis. Status post left neck mass surgery. LVAD. EXAM: CT CHEST WITH CONTRAST TECHNIQUE: Multidetector CT imaging of the chest was performed during intravenous contrast administration. CONTRAST:  75 cc ISOVUE-300 IOPAMIDOL (ISOVUE-300) INJECTION 61%, <See Chart> ISOVUE-300 IOPAMIDOL (ISOVUE-300) INJECTION 61% COMPARISON:  Chest radiograph from one day prior. 12/03/2016 chest CT. FINDINGS: Cardiovascular: Mild cardiomegaly. Left ventricular assist device appears well positioned with the inflow catheter in place at the left ventricular apex and outflow catheter anastomosed to the ascending aorta. No significant pericardial fluid/thickening. Aortic valve prosthesis is in place. Tricuspid valve prosthesis is in place. Left main and 3 vessel coronary atherosclerosis status post CABG. Atherosclerotic nonaneurysmal thoracic aorta. Dilated main pulmonary artery (4.1 cm node), stable. No central pulmonary emboli. Single lead right subclavian ICD is noted with lead tip in the right ventricular apex. Left internal jugular central venous catheter terminates at the cavoatrial junction. Mediastinum/Nodes: Stable hypodense 1.3 cm anterior right thyroid lobe nodule. Small amount of retained oral contrast in the mid to lower thoracic esophagus. No pathologically enlarged axillary, mediastinal or hilar lymph nodes. No mediastinal fluid collections. No pneumomediastinum. Lungs/Pleura: No pneumothorax. No pleural effusion. No acute consolidative airspace disease or lung masses. Mild patchy tree-in-bud opacity in the right lower lobe is unchanged since 12/03/2016 chest CT,  compatible with benign postinflammatory opacity. No significant pulmonary nodules. Upper abdomen: Contrast reflux is seen into the IVC and hepatic veins. Musculoskeletal: No aggressive appearing focal osseous lesions. Intact sternotomy wires. Marked thoracic spondylosis. IMPRESSION: 1. No active pulmonary disease. Stable chronic mild postinflammatory tree-in-bud opacities in right lower lobe. 2. Cardiomegaly. Contrast reflux into the IVC and hepatic veins, indicative of elevated central venous pressures/right heart failure. LVAD appears well-positioned. No mediastinal fluid collections. 3. Stable dilated main pulmonary artery compatible with chronic pulmonary arterial hypertension. 4. Small amount of oral contrast in the mid to lower thoracic esophagus, suggesting esophageal dysmotility and/or gastroesophageal reflux. Aortic Atherosclerosis (ICD10-I70.0). Electronically Signed   By: Ilona Sorrel M.D.   On: 11/25/2017 14:12    Anti-infectives: Anti-infectives (From admission, onward)   Start     Dose/Rate Route Frequency Ordered Stop   11/25/17 1000  amoxicillin-clavulanate (AUGMENTIN) 875-125 MG per tablet 1 tablet  Status:  Discontinued     1 tablet Oral Every 12 hours 11/25/17 0821 11/25/17 0824   11/25/17 1000  amoxicillin-clavulanate (AUGMENTIN) 500-125 MG per tablet 500 mg  Status:  Discontinued     1 tablet Oral Every 12 hours 11/25/17 0824 11/25/17 0825   11/25/17 1000  amoxicillin-clavulanate (AUGMENTIN) 500-125 MG per tablet 500 mg     1 tablet Oral Every 24 hours 11/25/17 0825     11/20/17 0800  ceFAZolin (ANCEF) IVPB 2g/100 mL premix  Status:  Discontinued     2 g 200 mL/hr over 30 Minutes Intravenous To [facility]Stay [facility] 11/19/17 2312 11/20/17 1054   11/20/17 0600  ceFAZolin (ANCEF) IVPB 2g/100 mL premix  Status:  Discontinued     2 g 200 mL/hr over 30 Minutes Intravenous On call to O.R. 11/19/17 2312  11/19/17 2327      Assessment/Plan: s/p Procedure(s): EXPLORATION AND EXCISION  OF LEFT NECK (Left) Continue close monitoring of neck.   Continue JP drain Augmentin for wound coverage.   LOS: 9 days    Twana Wileman,PA-C Plastic Surgery [phone]

## 2017-11-27 NOTE — Progress Notes (Signed)
LVAD Coordinator Rounding Note:  Admitted1/15/2019 for scheduled inpatient procedure/heparin bridging.S/P excision of squamous cell CA x2 for neck and right leg on 11/20/2017.  Evacuation of neck hematoma 11/26/17  HM II LVAD implanted on 12/13/16 by Dr. Darcey Nora under Destination Therapy criteria.  Vital signs: HR: 78AFib Doppler Pressure:74 Automatic BP:77/60 (67) O2 Sat: 99% on RA Wt in lbs:179>177>182>187>183>183lbs  LVAD interrogation reveals:  Speed:9600 Flow:6.3 Power: 6.7w PI:3.3 Alarms: none Events:>100 PI events Fixed speed: 9600 Low speed limit: 9000  Drive Line: Patient uses weekly kits. Changed today. Next due 12/04/17.  Existing VAD dressing removed and site care performed using sterile technique. Drive line exit site cleaned with Chlora prep applicators x 2, allowed to dry, and Sorbaview dressing with bio patch re-applied. Exit site healed and incorporated, the velour is fully implanted at exit site. No redness, tenderness, drainage, foul odor or rash noted. Drive line anchor re-applied.  Infection: Augmentin started 11/25/17 for neck wound  Labs:  INR trend:1.90>1.89>1.42>1.33>2.34>1.81 (warfarin held 1/21 due to neck bleeding)>1.4>1.33  Anticoagulation Plan: -INR Goal: 2.0 - 2.5.. Warfarin re-started 11/26/17.  Heparin gtt started 11/26/2017   Device: -Medtronic single lead -Therapies: on - last check: 11/26/2016  Plan/Recommendations:   1.Please page VAD coordinator for equipment issues or patient concerns 2. VAD weekly dressing changed today.   Balinda Quails RN, VAD Coordinator 24/7 pager [phone]

## 2017-11-27 NOTE — Progress Notes (Signed)
Patient ID: Trevor [name], male   DOB: 12-20-1945, 72 y.o.   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   Path shows extension of squamous cell CA to margins at neck site.   1/23 Returned for evacuation hematoma at neck surgery site.   Hgb down 7.1. INR 1.3. LDH 215,  MAP 84   Complaining of fatigue. Denies SOB. No hemoptysis over night.   LVAD INTERROGATION:  HeartMate II LVAD:  Flow 6.3  liters/min, speed 9600, power 6.7 , PI 3,3 .  Multiple PI events.   Objective:    Vital Signs:   Temp:  [97 F (36.1 C)-100.3 F (37.9 C)] 99.3 F (37.4 C) (01/24 0400) Pulse Rate:  [41-102] 42 (01/24 0400) Resp:  [12-25] 15 (01/24 0400) BP: (49-117)/(23-84) 78/27 (01/24 0400) SpO2:  [91 %-100 %] 97 % (01/24 0400) Weight:  [181 lb 10.5 oz (82.4 kg)-183 lb 10.3 oz (83.3 kg)] 183 lb 10.3 oz (83.3 kg) (01/24 0500) Last BM Date: 11/25/17 Mean arterial Pressure 84   Intake/Output:   Intake/Output Summary (Last 24 hours) at 11/27/2017 0714 Last data filed at 11/27/2017 0600 Gross per 24 hour  Intake 1117.17 ml  Output 1274 ml  Net -156.83 ml     Physical Exam    Physical Exam: GENERAL: NAD. In bed.  HEENT: normal  NECK: Supple, JVP 9-10  Carotic 2+ bilaterally, no bruits.  No lymphadenopathy or thyromegaly appreciated.   CARDIAC:  Mechanical heart sounds with LVAD hum present.  LUNGS:  Clear to auscultation bilaterally.  ABDOMEN:  Soft, round, nontender, positive bowel sounds x4.     LVAD exit site: well-healed and incorporated.  Dressing dry and intact.  No erythema or drainage.  Stabilization device present and accurately applied.  Driveline dressing is being changed daily per sterile technique. EXTREMITIES:  Warm and dry, no cyanosis, clubbing, rash or edema  NEUROLOGIC:  Alert and oriented x 4.  Gait steady.  No aphasia.  No dysarthria.  Affect flat   SKIN: L neck  ecchymotic.incision intact. No exudate. JP L neck.    Telemetry   A fib 70s with frequent PVCs personally reviewed.    Labs   Basic Metabolic Panel: Recent Labs  Lab 11/23/17 0652 11/24/17 0224 11/25/17 0345 11/26/17 0254 11/27/17 0257  NA 129* 127* 130* 129* 132*  K 4.1 4.7 4.5 4.8 4.2  CL 91* 91* 94* 92* 95*  CO2 24 24 25 24 25   GLUCOSE 162* 177* 185* 166* 163*  BUN 39* 50* 27* 42* 28*  CREATININE 5.73* 6.51* 4.28* 5.75* 4.17*  CALCIUM 8.4* 8.3* 8.3* 8.5* 8.2*  PHOS 5.4* 5.4* 3.8 4.0 3.1    Liver Function Tests: Recent Labs  Lab 11/23/17 0652 11/24/17 0224 11/25/17 0345 11/26/17 0254 11/27/17 0257  ALBUMIN 3.5 3.5 3.4* 3.4* 2.9*   No results for input(s): LIPASE, AMYLASE in the last 168 hours. No results for input(s): AMMONIA in the last 168 hours.  CBC: Recent Labs  Lab 11/24/17 0800 11/25/17 0345 11/25/17 1507 11/25/17 1929 11/26/17 0254 11/27/17 0257  WBC 10.1 9.4 10.4 10.7* 9.9 11.6*  NEUTROABS 7.4 7.5  --   --  7.2 9.1*  HGB 8.7* 8.6* 8.2* 7.8* 8.2* 7.1*  HCT 26.6* 27.0* 25.1* 24.3* 25.9* 22.0*  MCV 92.7 94.4 94.4 94.2 94.9 95.7  PLT 132* 138* 139* 134* 144* 149*  INR: Recent Labs  Lab 11/24/17 0224 11/25/17 0345 11/25/17 1507 11/26/17 0254 11/27/17 0257  INR 2.34 1.81 1.56 1.40 1.33    Other results:     Imaging   Ct Soft Tissue Neck W Contrast  Result Date: 11/25/2017 CLINICAL DATA:  Left neck mass.  Recent excision of skin cancer. EXAM: CT NECK WITH CONTRAST TECHNIQUE: Multidetector CT imaging of the neck was performed using the standard protocol following the bolus administration of intravenous contrast. CONTRAST:  <See Chart> ISOVUE-300 IOPAMIDOL (ISOVUE-300) INJECTION 61%, <See Chart> ISOVUE-300 IOPAMIDOL (ISOVUE-300) INJECTION 61% COMPARISON:  None. FINDINGS: Pharynx and larynx: Mild esophageal thickening is present. No focal mucosal or submucosal lesion. The tongue base is within normal limits. Vocal cords are midline and  symmetric. Oropharynx is within limits. Hypopharynx is unremarkable. The epiglottis is within normal limits. Salivary glands: The submandibular and parotid glands are within normal limits bilaterally. Thyroid: Normal. Lymph nodes: Subcentimeter left supraclavicular nodes are likely reactive. No significant adenopathy is present. Vascular: Atherosclerotic calcifications are present at the carotid bifurcations bilaterally. No definite stenosis of greater than 50%. Limited intracranial: Unremarkable. Visualized orbits: Within normal limits. Mastoids and visualized paranasal sinuses: The paranasal sinuses and mastoid air cells are clear. Skeleton: Degenerative changes are present in the cervical spine. Grade 1 anterolisthesis is present at C3-4. Chronic loss disc space with uncovertebral spurring bilaterally at C4-5, C5-6, and to lesser extent C6-7. Upper chest: The lung apices are clear. Median sternotomy is again noted. Other: A left neck is mixed density collection is noted posterior to the left sternocleidomastoid muscle. The collection measures 9.8 x 3.7 x 3.4 cm. There is gas within the collection. High-density material near the superior aspect of the collection is concerning for recurrent hemorrhage. This creates mass effect in the subcutaneous space. Some mass effect on the left sternocleidomastoid muscle. IMPRESSION: 1. 9.8 cm mixed density left subcutaneous hyperdense collection compatible with acute hematoma. 2. Active extravasation into the superior aspect of the collection compatible with arterial hemorrhage. 3. Superficial mass effect on the left sternocleidomastoid muscle. 4. Extensive atherosclerotic disease. 5. Atherosclerosis. These results were called by telephone at the time of interpretation on 11/25/2017 at 2:25 pm to [name] , who verbally acknowledged these results. Electronically Signed   By: San Morelle M.D.   On: 11/25/2017 14:31   Ct Chest W Contrast  Result Date:  11/25/2017 CLINICAL DATA:  Inpatient. Hemoptysis. Status post left neck mass surgery. LVAD. EXAM: CT CHEST WITH CONTRAST TECHNIQUE: Multidetector CT imaging of the chest was performed during intravenous contrast administration. CONTRAST:  75 cc ISOVUE-300 IOPAMIDOL (ISOVUE-300) INJECTION 61%, <See Chart> ISOVUE-300 IOPAMIDOL (ISOVUE-300) INJECTION 61% COMPARISON:  Chest radiograph from one day prior. 12/03/2016 chest CT. FINDINGS: Cardiovascular: Mild cardiomegaly. Left ventricular assist device appears well positioned with the inflow catheter in place at the left ventricular apex and outflow catheter anastomosed to the ascending aorta. No significant pericardial fluid/thickening. Aortic valve prosthesis is in place. Tricuspid valve prosthesis is in place. Left main and 3 vessel coronary atherosclerosis status post CABG. Atherosclerotic nonaneurysmal thoracic aorta. Dilated main pulmonary artery (4.1 cm node), stable. No central pulmonary emboli. Single lead right subclavian ICD is noted with lead tip in the right ventricular apex. Left internal jugular central venous catheter terminates at the cavoatrial junction. Mediastinum/Nodes: Stable hypodense 1.3 cm anterior right thyroid lobe nodule. Small amount of retained oral contrast in the mid to lower thoracic esophagus. No pathologically enlarged axillary, mediastinal or hilar lymph nodes. No mediastinal fluid collections. No pneumomediastinum.  Lungs/Pleura: No pneumothorax. No pleural effusion. No acute consolidative airspace disease or lung masses. Mild patchy tree-in-bud opacity in the right lower lobe is unchanged since 12/03/2016 chest CT, compatible with benign postinflammatory opacity. No significant pulmonary nodules. Upper abdomen: Contrast reflux is seen into the IVC and hepatic veins. Musculoskeletal: No aggressive appearing focal osseous lesions. Intact sternotomy wires. Marked thoracic spondylosis. IMPRESSION: 1. No active pulmonary disease. Stable  chronic mild postinflammatory tree-in-bud opacities in right lower lobe. 2. Cardiomegaly. Contrast reflux into the IVC and hepatic veins, indicative of elevated central venous pressures/right heart failure. LVAD appears well-positioned. No mediastinal fluid collections. 3. Stable dilated main pulmonary artery compatible with chronic pulmonary arterial hypertension. 4. Small amount of oral contrast in the mid to lower thoracic esophagus, suggesting esophageal dysmotility and/or gastroesophageal reflux. Aortic Atherosclerosis (ICD10-I70.0). Electronically Signed   By: Ilona Sorrel M.D.   On: 11/25/2017 14:12     Medications:     Scheduled Medications: . amoxicillin-clavulanate  1 tablet Oral Q24H  . atorvastatin  40 mg Oral q1800  . budesonide  0.5 mg Inhalation BID  . busPIRone  5 mg Oral BID  . calcitRIOL  0.5 mcg Oral Q M,W,F-HD  . [START ON 11/28/2017] Chlorhexidine Gluconate Cloth  6 each Topical Tomorrow-1000  . darbepoetin (ARANESP) injection - DIALYSIS  100 mcg Intravenous Q Wed-HD  . docusate sodium  100 mg Oral Daily  . ferric citrate  420 mg Oral TID WC  . gabapentin  300 mg Oral BID  . insulin aspart  0-15 Units Subcutaneous TID WC  . insulin aspart  0-5 Units Subcutaneous QHS  . insulin aspart  3 Units Subcutaneous TID WC  . levothyroxine  25 mcg Oral QAC breakfast  . lidocaine-EPINEPHrine  20 mL Intradermal Once  . multivitamin  1 tablet Oral QHS  . pantoprazole  40 mg Oral Daily  . sildenafil  40 mg Oral TID  . thrombin recombinant  5,000 Units Topical Once  . Warfarin - Pharmacist Dosing Inpatient   Does not apply q1800    Infusions: . sodium chloride Stopped (11/26/17 1100)    PRN Medications: acetaminophen, albuterol, ferric citrate, guaiFENesin, lidocaine, ondansetron (ZOFRAN) IV, oxyCODONE, traMADol, traZODone   Patient Profile   Trevor [name] is a 72 y.o. male  history of CAD s/p CABG x 4 2010, OSA, AS with TAVR 2015, ESRD, DM2, paroxysmal atrial  fibrillation, asthma, and ischemic cardiomyopathy with Medtronic ICD. 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 Post op period complicated by ARF leading to ESRD and need for chronic dialysis.   Admitted 11/18/17 for heparin bridging for Excision of SCC x 2.   Assessment/Plan:    1. 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  Margins not clear at neck.  -1/23 Evacuation of hematoma.   - Hgb down to 7.1 Transfuse 2U PRBCs today.  - Restart heparin today. Discussed with pharmacy and Dr Aundra Dubin.  - Further steps in terms of neck site cancer per surgery.   - Continue Augmentin bid for surgical site.  2. Chronic systolic CHF: Ischemic cardiomyopathy, EF 20-25% with RV dysfunction on 2/18 echo pre-LVAD. 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 Medtronic ICD.  Volume status ok. HD tomorrow.   - Continue sildenafil 40 TID for RV failure - warfarin goal 2-2.5.  3. CAD: s/p CABG.  No evidence for ischemia.     4. ESRD: HD needs to be done at bedside with VAD trained personnel present.  Nephrology following.   5. Atrial  fibrillation: Chronic. 6. RV failure: Continue Revatio as above.  7. MHW:KGSU stable.  8. Anemia: Baseline anemia of renal disease/chronic disease but recent bleeding from colonic AVMs.  - Hgb down 10.0-> 8.6 -> 8.2 -->7.1 with surgical site bleeding.  - Give 2UPRBCs.  9. 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  Off ASA. INR goal 2-2.5.  - Continue octreotide as outpatient.  10. H/o TAVR:  Stable on most recent echo.  11. Hemoptysis: ?Bleeding site.  No obvious mouth lesion.  [name] has evaluated neck surgical site, unlikely to be from there.  He says he coughs up the blood.  CT chest did not show lesion that would cause hemoptysis. GI has seen, do not think GI source.  He feels dripping in the back of his throat, possibly blood is coming from sinuses/upper airways.  Seems to have resolved. Add nasal spray.   Length of Stay: [place],  NP 11/27/2017, 7:14 AM  VAD Team --- VAD ISSUES ONLY--- Pager [phone] (7am - 7am)  Advanced Heart Failure Team  Pager [phone] (M-F; 7a - 4p)  Please contact [facility] for night-coverage after hours (4p -7a ) and weekends on [email or web address]  Patient seen with NP, agree with the above note.  Fatigued this morning, hemoglobin down to 7.1.  He does not appear to be significantly bleeding from neck site at this point, suspect hemoglobin level is catching up with prior bleeding.  No further hemoptysis.   - Transfuse 2 units PRBCs today.    INR 1.3. Coumadin has been restarted.  Bleeding has appeared to stop.  Will start on heparin gtt, aim for low range of therapeutic.   Loralie Champagne 11/27/2017 7:46 AM

## 2017-11-27 NOTE — Progress Notes (Addendum)
[facility] Progress Note   Subjective:   Doesn't remember having HD yesterday  Objective Vitals:   11/27/17 0755 11/27/17 0837 11/27/17 0900 11/27/17 1038  BP:    (!) 77/60  Pulse: (!) 36  72   Resp: 17  15 15   Temp: 98.7 F (37.1 C)   98.8 F (37.1 C)  TempSrc: Oral   Oral  SpO2: 96% 99% 93% 99%  Weight:      Height:       Physical Exam General:NAD, pleasant male Neck:  L neck drain and bandage in place Chest wall:  IJ PC in place. Heart:mechanical heart sounds, LVAD hum Lungs: CTA bilat Abdomen:soft, NTND Extremities:no LE edema, bandage on R calf Dialysis Access: LU AVF +b/t   Filed Weights   11/26/17 1645 11/26/17 2045 11/27/17 0500  Weight: 83.2 kg (183 lb 6.8 oz) 82.4 kg (181 lb 10.5 oz) 83.3 kg (183 lb 10.3 oz)    Intake/Output Summary (Last 24 hours) at 11/27/2017 1059 Last data filed at 11/27/2017 1038 Gross per 24 hour  Intake 1052.17 ml  Output 1264 ml  Net -211.83 ml    Additional Objective Labs: Basic Metabolic Panel: Recent Labs  Lab 11/25/17 0345 11/26/17 0254 11/27/17 0257  NA 130* 129* 132*  K 4.5 4.8 4.2  CL 94* 92* 95*  CO2 25 24 25   GLUCOSE 185* 166* 163*  BUN 27* 42* 28*  CREATININE 4.28* 5.75* 4.17*  CALCIUM 8.3* 8.5* 8.2*  PHOS 3.8 4.0 3.1   Liver Function Tests: Recent Labs  Lab 11/25/17 0345 11/26/17 0254 11/27/17 0257  ALBUMIN 3.4* 3.4* 2.9*   No results for input(s): LIPASE, AMYLASE in the last 168 hours. CBC: Recent Labs  Lab 11/25/17 0345 11/25/17 1507 11/25/17 1929 11/26/17 0254 11/27/17 0257  WBC 9.4 10.4 10.7* 9.9 11.6*  NEUTROABS 7.5  --   --  7.2 9.1*  HGB 8.6* 8.2* 7.8* 8.2* 7.1*  HCT 27.0* 25.1* 24.3* 25.9* 22.0*  MCV 94.4 94.4 94.2 94.9 95.7  PLT 138* 139* 134* 144* 149*    CBG: Recent Labs  Lab 11/26/17 0922 11/26/17 1139 11/26/17 1730 11/26/17 2204 11/27/17 0808  GLUCAP 167* 161* 122* 157* 160*    Lab Results  Component Value Date   INR 1.33 11/27/2017   INR 1.40  11/26/2017   INR 1.56 11/25/2017    Medications: . sodium chloride 10 mL/hr at 11/27/17 0742  . heparin 1,000 Units/hr (11/27/17 0805)   . amoxicillin-clavulanate  1 tablet Oral Q24H  . atorvastatin  40 mg Oral q1800  . budesonide  0.5 mg Inhalation BID  . busPIRone  5 mg Oral BID  . calcitRIOL  0.5 mcg Oral Q M,W,F-HD  . [START ON 11/28/2017] Chlorhexidine Gluconate Cloth  6 each Topical Tomorrow-1000  . darbepoetin (ARANESP) injection - DIALYSIS  100 mcg Intravenous Q Wed-HD  . docusate sodium  100 mg Oral Daily  . ferric citrate  420 mg Oral TID WC  . gabapentin  300 mg Oral BID  . insulin aspart  0-15 Units Subcutaneous TID WC  . insulin aspart  0-5 Units Subcutaneous QHS  . insulin aspart  3 Units Subcutaneous TID WC  . levothyroxine  25 mcg Oral QAC breakfast  . lidocaine-EPINEPHrine  20 mL Intradermal Once  . multivitamin  1 tablet Oral QHS  . pantoprazole  40 mg Oral Daily  . sildenafil  40 mg Oral TID  . thrombin recombinant  5,000 Units Topical Once  . warfarin  7.5  mg Oral ONCE-1800  . Warfarin - Pharmacist Dosing Inpatient   Does not apply q1800    Dialysis Orders: [facility] - [facility] 4.5h   82kg   2K/ 2Ca   LU AVF/ TDC (one needle in AVF)  Hep 5800 Mircera 200 mcg IV q2wks, last on 11/05/17 venofer 50mg  IV qwk, last 11/12/17 Calcitriol 0.40mcg PO qHD TID  Last Labs: 11/12/17 Hgb 12.2, TSAT 23%, K 4.4, Ca 8.9, P 6.7, PTH 245, Albumin 4.2  Assessment: 1. SCCa L neck and RLE- sp excision by [name] on 1/17 2. ESRD -  continue per regular MWFS schedule. 4x /week is for vol overload issues. Using one needle in AVF.  K ok. HOLD heparin for now with bleeding issues.  3. Hypertension/volume  - close to dry wt, chronic low BP, no ^vol on exam 4. Anemia  - Hgb trending down, continue to monitor. Plan to transfuse if Hgb <8.  Aranesp 140mcg qwk (Wed) started on 1/16. 5. Secondary Hyperparathyroidism -  Ca 8.3, using 2.5K bath today. P 5.4. Continue binders, VDRA. 6. Nutrition  - Alb 3.5. Renal/Carb modified diet w/Fluid restriction. Renavite. 7. Chronic systolic/diastolic CHF: ischemic cardiomyopathy, EF 20-25% w/LVAD, Medtronic ICD. - on coumadin - per cardio/LVAD team 8. CAD s/p CABG - stable, per cardio 9. A fib - on coumadin - per cardio 10. H/o GIB - per primary 11. H/o TAVR - stable, per primary 12. DM - stable, per primary  P - MWF, maybe Sat HD, esa, HD Friday   Rob [place] pager [phone]   11/27/2017, 10:59 AM

## 2017-11-27 NOTE — Progress Notes (Signed)
ANTICOAGULATION CONSULT NOTE  Pharmacy Consult for IV Heparin Indication: LVAD  No Active Allergies  Patient Measurements: Height: 5\' 5"  (165.1 cm) Weight: 183 lb 10.3 oz (83.3 kg)(standing) IBW/kg (Calculated) : 61.5 Heparin Dosing Weight: 78 kg  Vital Signs: Temp: 98.2 F (36.8 C) (01/24 1403) Temp Source: Oral (01/24 1403) BP: 98/79 (01/24 1403) Pulse Rate: 97 (01/24 1500)  Labs: Recent Labs    11/25/17 0345 11/25/17 1507 11/25/17 1929 11/26/17 0254 11/27/17 0257 11/27/17 1534  HGB 8.6* 8.2* 7.8* 8.2* 7.1*  --   HCT 27.0* 25.1* 24.3* 25.9* 22.0*  --   PLT 138* 139* 134* 144* 149*  --   LABPROT 20.9* 18.6*  --  17.1* 16.4*  --   INR 1.81 1.56  --  1.40 1.33  --   HEPARINUNFRC  --   --   --   --   --  0.14*  CREATININE 4.28*  --   --  5.75* 4.17*  --     Estimated Creatinine Clearance: 15.9 mL/min (A) (by C-G formula based on SCr of 4.17 mg/dL (H)).  Assessment: 72 yo male with LVAD, admitted for IV heparin bridge before dermatologic surgery 1/17.  PTA Coumadin dose 7.5 mg daily, last dose taken 1/13 per patient report. Pt is now s/p excision of left neck and right leg > with neck hematoma 1/21> drained in room by plastic surgeon - still oozing in pm so held warfarin and wound repacked> improved 1/22> dressing again changed and evacuated hematoma by plastic surgeon > repacked again 1/22 pm. Warfarin held again and pt back to OR 1/23 with oozing sites cauterized.   Warfarin restarted 1/23 - planning to keep INR on lower end Heparin resumed 1/24 - goal heparin level low  Initial heparin level is sub-therapeutic for goal at 0.14 on 1000 units/hr. Hgb was down at 7.1 this AM prior to heparin start and patient was transfused. Per RN, JP drain in place but no significant oozing.   Goal of Therapy:  HL 0.25ish Monitor platelets by anticoagulation protocol: Yes   Plan:  Increase heparin to 1100 units/hr.  Check heparin level in 8 hrs.  Sloan Leiter, PharmD, BCPS,  BCCCP Clinical Pharmacist Clinical phone 11/27/2017 until 11PM [phone] After hours, please call #[phone]  11/27/2017 4:14 PM

## 2017-11-27 NOTE — Progress Notes (Signed)
Paged on call VAD coordinator regarding increase in PI numbers ~5 and having PI events while pt sitting up in chair. Pt asymptomatic doppler MAP 64. No new orders at this time. Will continue to monitor. Per Balinda Quails, RN will let MD know.

## 2017-11-27 NOTE — Progress Notes (Signed)
[place] for IV Heparin >>Coumadin Indication: LVAD  No Active Allergies  Patient Measurements: Height: 5\' 5"  (165.1 cm) Weight: 183 lb 10.3 oz (83.3 kg)(standing) IBW/kg (Calculated) : 61.5 Heparin Dosing Weight: 78 kg  Vital Signs: Temp: 98.7 F (37.1 C) (01/24 0755) Temp Source: Oral (01/24 0755) BP: 78/27 (01/24 0400) Pulse Rate: 36 (01/24 0755)  Labs: Recent Labs    11/25/17 0345 11/25/17 1507 11/25/17 1929 11/26/17 0254 11/27/17 0257  HGB 8.6* 8.2* 7.8* 8.2* 7.1*  HCT 27.0* 25.1* 24.3* 25.9* 22.0*  PLT 138* 139* 134* 144* 149*  LABPROT 20.9* 18.6*  --  17.1* 16.4*  INR 1.81 1.56  --  1.40 1.33  CREATININE 4.28*  --   --  5.75* 4.17*    Estimated Creatinine Clearance: 15.9 mL/min (A) (by C-G formula based on SCr of 4.17 mg/dL (H)).  Assessment: 72 yo male with LVAD, admitted for IV heparin bridge before dermatologic surgery 1/17.  PTA Coumadin dose 7.5 mg daily, last dose taken 1/13 per patient report.  Pt is now s/p excision of left neck and right leg > with neck hematoma 1/21> drained in room by plastic surgeon - still oozing in pm so held warfarin and wound repacked> improved 1/22> dressing again changed and evacuated hematoma by plastic surgeon > repacked again 1/22 pm Warfarin held again and pt back to OR 1/23 with oozing sites cauterized.  H/h stable  Warfarin restarted 1/23 - planning to keep INR on lower end Heparin resumed 1/24 - goal heparin level low  Goal of Therapy:  INR goal 2-2.5 HL 0.25ish Monitor platelets by anticoagulation protocol: Yes   Plan:  Start IV heparin at 1000 units/hr Check heparin level in 8 hrs. Warfarin 7.5 mg tonight  Daily INR, heparin level and CBC  Uvaldo Rising, BCPS  Clinical Pharmacist Pager [phone]  11/27/2017 9:20 AM

## 2017-11-28 LAB — CBC WITH DIFFERENTIAL/PLATELET
BASOS ABS: 0 10*3/uL (ref 0.0–0.1)
BASOS PCT: 0 %
EOS ABS: 0.3 10*3/uL (ref 0.0–0.7)
Eosinophils Relative: 3 %
HCT: 26.4 % — ABNORMAL LOW (ref 39.0–52.0)
Hemoglobin: 8.8 g/dL — ABNORMAL LOW (ref 13.0–17.0)
Lymphocytes Relative: 16 %
Lymphs Abs: 1.8 10*3/uL (ref 0.7–4.0)
MCH: 30.9 pg (ref 26.0–34.0)
MCHC: 33.3 g/dL (ref 30.0–36.0)
MCV: 92.6 fL (ref 78.0–100.0)
MONO ABS: 1.2 10*3/uL — AB (ref 0.1–1.0)
MONOS PCT: 10 %
NEUTROS ABS: 8.2 10*3/uL — AB (ref 1.7–7.7)
NEUTROS PCT: 71 %
Platelets: 141 10*3/uL — ABNORMAL LOW (ref 150–400)
RBC: 2.85 MIL/uL — ABNORMAL LOW (ref 4.22–5.81)
RDW: 19.2 % — AB (ref 11.5–15.5)
WBC: 11.5 10*3/uL — ABNORMAL HIGH (ref 4.0–10.5)

## 2017-11-28 LAB — RENAL FUNCTION PANEL
ALBUMIN: 3.1 g/dL — AB (ref 3.5–5.0)
Anion gap: 10 (ref 5–15)
BUN: 44 mg/dL — AB (ref 6–20)
CALCIUM: 8.3 mg/dL — AB (ref 8.9–10.3)
CO2: 25 mmol/L (ref 22–32)
CREATININE: 5.65 mg/dL — AB (ref 0.61–1.24)
Chloride: 94 mmol/L — ABNORMAL LOW (ref 101–111)
GFR calc Af Amer: 10 mL/min — ABNORMAL LOW (ref >60)
GFR, EST NON AFRICAN AMERICAN: 9 mL/min — AB (ref >60)
GLUCOSE: 137 mg/dL — AB (ref 65–99)
PHOSPHORUS: 3.6 mg/dL (ref 2.5–4.6)
Potassium: 4.3 mmol/L (ref 3.5–5.1)
SODIUM: 129 mmol/L — AB (ref 135–145)

## 2017-11-28 LAB — GLUCOSE, CAPILLARY
GLUCOSE-CAPILLARY: 206 mg/dL — AB (ref 65–99)
Glucose-Capillary: 155 mg/dL — ABNORMAL HIGH (ref 65–99)
Glucose-Capillary: 103 mg/dL — ABNORMAL HIGH (ref 65–99)
Glucose-Capillary: 138 mg/dL — ABNORMAL HIGH (ref 65–99)

## 2017-11-28 LAB — PROTIME-INR
INR: 1.32
PROTHROMBIN TIME: 16.3 s — AB (ref 11.4–15.2)

## 2017-11-28 LAB — TYPE AND SCREEN
ABO/RH(D): A NEG
ANTIBODY SCREEN: NEGATIVE
UNIT DIVISION: 0
Unit division: 0

## 2017-11-28 LAB — BPAM RBC
BLOOD PRODUCT EXPIRATION DATE: 201902062359
BLOOD PRODUCT EXPIRATION DATE: 201902092359
ISSUE DATE / TIME: 201901240721
ISSUE DATE / TIME: 201901241056
UNIT TYPE AND RH: 600
Unit Type and Rh: 600

## 2017-11-28 LAB — HEPARIN LEVEL (UNFRACTIONATED)
HEPARIN UNFRACTIONATED: 0.18 [IU]/mL — AB (ref 0.30–0.70)
HEPARIN UNFRACTIONATED: 0.18 [IU]/mL — AB (ref 0.30–0.70)

## 2017-11-28 LAB — LACTATE DEHYDROGENASE: LDH: 233 U/L — AB (ref 98–192)

## 2017-11-28 MED ORDER — WARFARIN SODIUM 7.5 MG PO TABS
7.5000 mg | ORAL_TABLET | Freq: Once | ORAL | Status: AC
  Administered 2017-11-28: 7.5 mg via ORAL
  Filled 2017-11-28: qty 1

## 2017-11-28 MED ORDER — POLYETHYLENE GLYCOL 3350 17 G PO PACK
17.0000 g | PACK | Freq: Every day | ORAL | Status: DC
  Administered 2017-11-28 – 2017-12-03 (×4): 17 g via ORAL
  Filled 2017-11-28 (×5): qty 1

## 2017-11-28 NOTE — Progress Notes (Signed)
[facility] Progress Note   Subjective:   Up walking in the halls, no SOB or leg swelling  Objective Vitals:   11/28/17 1100 11/28/17 1127 11/28/17 1145 11/28/17 1200  BP: (!) 76/0 (!) 82/0 (!) 80/0 (!) 70/0  Pulse: 66 60 60 63  Resp: 15 15 13 14   Temp: 97.7 F (36.5 C)     TempSrc:      SpO2:      Weight: 86.2 kg (190 lb 0.6 oz)     Height:       Physical Exam General:NAD, pleasant male Neck:  L neck drain and bandage in place Chest wall:  IJ PC in place. Heart:mechanical heart sounds, LVAD hum Lungs: CTA bilat Abdomen:soft, NTND Extremities:no LE edema, bandage on R calf Dialysis Access: LU AVF +b/t   Filed Weights   11/27/17 0500 11/28/17 0500 11/28/17 1100  Weight: 83.3 kg (183 lb 10.3 oz) 86.2 kg (190 lb) 86.2 kg (190 lb 0.6 oz)    Intake/Output Summary (Last 24 hours) at 11/28/2017 1208 Last data filed at 11/28/2017 1200 Gross per 24 hour  Intake 756.3 ml  Output 265 ml  Net 491.3 ml    Additional Objective Labs: Basic Metabolic Panel: Recent Labs  Lab 11/26/17 0254 11/27/17 0257 11/28/17 0120  NA 129* 132* 129*  K 4.8 4.2 4.3  CL 92* 95* 94*  CO2 24 25 25   GLUCOSE 166* 163* 137*  BUN 42* 28* 44*  CREATININE 5.75* 4.17* 5.65*  CALCIUM 8.5* 8.2* 8.3*  PHOS 4.0 3.1 3.6   Liver Function Tests: Recent Labs  Lab 11/26/17 0254 11/27/17 0257 11/28/17 0120  ALBUMIN 3.4* 2.9* 3.1*   No results for input(s): LIPASE, AMYLASE in the last 168 hours. CBC: Recent Labs  Lab 11/25/17 1507 11/25/17 1929 11/26/17 0254 11/27/17 0257 11/28/17 0120  WBC 10.4 10.7* 9.9 11.6* 11.5*  NEUTROABS  --   --  7.2 9.1* 8.2*  HGB 8.2* 7.8* 8.2* 7.1* 8.8*  HCT 25.1* 24.3* 25.9* 22.0* 26.4*  MCV 94.4 94.2 94.9 95.7 92.6  PLT 139* 134* 144* 149* 141*    CBG: Recent Labs  Lab 11/27/17 0808 11/27/17 1215 11/27/17 1638 11/27/17 2048 11/28/17 0830  GLUCAP 160* 225* 109* 142* 155*    Lab Results  Component Value Date   INR 1.32 11/28/2017    INR 1.33 11/27/2017   INR 1.40 11/26/2017    Medications: . sodium chloride 10 mL/hr at 11/27/17 0742  . heparin 1,250 Units/hr (11/28/17 0736)   . amoxicillin-clavulanate  1 tablet Oral Q24H  . atorvastatin  40 mg Oral q1800  . budesonide  0.5 mg Inhalation BID  . busPIRone  5 mg Oral BID  . calcitRIOL  0.5 mcg Oral Q M,W,F-HD  . Chlorhexidine Gluconate Cloth  6 each Topical Tomorrow-1000  . darbepoetin (ARANESP) injection - DIALYSIS  100 mcg Intravenous Q Wed-HD  . docusate sodium  100 mg Oral Daily  . ferric citrate  420 mg Oral TID WC  . gabapentin  300 mg Oral BID  . insulin aspart  0-15 Units Subcutaneous TID WC  . insulin aspart  0-5 Units Subcutaneous QHS  . insulin aspart  3 Units Subcutaneous TID WC  . levothyroxine  25 mcg Oral QAC breakfast  . lidocaine-EPINEPHrine  20 mL Intradermal Once  . multivitamin  1 tablet Oral QHS  . pantoprazole  40 mg Oral Daily  . polyethylene glycol  17 g Oral Daily  . sildenafil  40 mg Oral TID  .  thrombin recombinant  5,000 Units Topical Once  . Warfarin - Pharmacist Dosing Inpatient   Does not apply q1800    Dialysis Orders: MWFS - [facility] 4.5h   82kg   2K/ 2Ca   LU AVF/ TDC (using AVF, 2 needles 17ga)  Hep 5800 Mircera 200 mcg IV q2wks, last on 11/05/17 venofer 50mg  IV qwk, last 11/12/17 Calcitriol 0.48mcg PO qHD TID  Last Labs: 11/12/17 Hgb 12.2, TSAT 23%, K 4.4, Ca 8.9, P 6.7, PTH 245, Albumin 4.2  Assessment: 1. SCCa L neck and RLE- sp excision by [name] on 1/17 2. ESRD -  MWFS schedule. 4x /week for Orlin Hilding issues.  Using AVF 2 needles 17ga at center prior to admission.  Cont to access AVF, may be able to pull cath while here. 3. HypOtension/vol - chronic low BP, up 4-5kg today, no ^vol on exam 4. Anemia  - Hgb trending down, continue to monitor. Plan to transfuse if Hgb <8.  Aranesp 193mcg qwk (Wed) started on 1/16. 5. Secondary Hyperparathyroidism -  Ca 8.3, using 2.5K bath today. P 5.4. Continue binders,  VDRA. 6. Nutrition - Alb 3.5. Renal/Carb modified diet w/Fluid restriction. Renavite. 7. 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 - on coumadin - per cardio/LVAD team 8. CAD s/p CABG - stable, per cardio 9. A fib - on coumadin - per cardio 10. H/o GIB - per primary 11. H/o TAVR - stable, per primary 12. DM - stable, per primary  P - MWFSat HD, UF as tol, hold heparin w/ bleeding   Kelly Splinter MD [place] pager [phone]   11/28/2017, 12:08 PM

## 2017-11-28 NOTE — Progress Notes (Addendum)
[place] for IV Heparin > warfarin Indication: LVAD  No Active Allergies  Patient Measurements: Height: 5\' 5"  (165.1 cm) Weight: 190 lb 0.6 oz (86.2 kg)(standing weight per bedside RN) IBW/kg (Calculated) : 61.5 Heparin Dosing Weight: 78 kg  Vital Signs: Temp: 97.7 F (36.5 C) (01/25 1100) Temp Source: Oral (01/25 0730) BP: 68/0 (01/25 1500) Pulse Rate: 60 (01/25 1500)  Labs: Recent Labs    11/26/17 0254 11/27/17 0257 11/27/17 1534 11/28/17 0048 11/28/17 0120 11/28/17 0905  HGB 8.2* 7.1*  --   --  8.8*  --   HCT 25.9* 22.0*  --   --  26.4*  --   PLT 144* 149*  --   --  141*  --   LABPROT 17.1* 16.4*  --  16.3*  --   --   INR 1.40 1.33  --  1.32  --   --   HEPARINUNFRC  --   --  0.14* 0.18*  --  0.18*  CREATININE 5.75* 4.17*  --   --  5.65*  --     Estimated Creatinine Clearance: 11.9 mL/min (A) (by C-G formula based on SCr of 5.65 mg/dL (H)).  Assessment: 72 yo male with LVAD, admitted for IV heparin bridge before dermatologic surgery 1/17.  PTA Coumadin dose 7.5 mg daily, last dose taken 1/13 per patient report. Pt is now s/p excision of left neck and right leg > with neck hematoma 1/21> drained in room by plastic surgeon - still oozing in pm so held warfarin and wound repacked> improved 1/22> dressing again changed and evacuated hematoma by plastic surgeon > repacked again 1/22 pm Warfarin held again and pt back to OR 1/23 with oozing sites cauterized.   Warfarin restarted 1/23 - planning to keep INR on lower end Heparin resumed 1/24 - goal heparin level low  Heparin drip 1200 uts/hr HL 0.18 < goal of 0.2-0.25ish Hgb stable today at 8.8 after PRBC 1/24.  INR 1.3 slowly titrate up  JP drain in place but no significant oozing.   PTA warfarin 7.5mg  daily  Goal of Therapy:  HL 0.2- 0.25ish INR 2-2.5 Monitor platelets by anticoagulation protocol: Yes   Plan:  Increase heparin to 1250 units/hr.  Warfarin 7.5mg  x1  Daily HL,  INR, CBC  [name] Pharm.D. CPP, BCPS Clinical Pharmacist [phone] 11/28/2017 3:20 PM

## 2017-11-28 NOTE — Progress Notes (Signed)
2 Days Post-Op   Subjective/Chief Complaint: Patient reports doing better.  Neck without hematoma, scant serous drainage on dressing and drainage minimal over past 24 hours and much more serous appearing.  Will plan to leave drain in until next week and see in office for removal.    Objective: Vital signs in last 24 hours: Temp:  [97.5 F (36.4 C)-98.8 F (37.1 C)] 97.5 F (36.4 C) (01/25 1527) Pulse Rate:  [53-81] 64 (01/25 1530) Resp:  [11-25] 17 (01/25 1527) BP: (68-113)/(0-88) 82/0 (01/25 1530) SpO2:  [93 %-98 %] 95 % (01/25 0730) Weight:  [83.7 kg (184 lb 8.4 oz)-86.2 kg (190 lb 0.6 oz)] 83.7 kg (184 lb 8.4 oz) (01/25 1527) Last BM Date: 11/25/17  Intake/Output from previous day: 01/24 0701 - 01/25 0700 In: 1426.3 [P.O.:600; I.V.:196.3; Blood:630] Out: 265 [Urine:250; Drains:15] Intake/Output this shift: Total I/O In: 99.2 [I.V.:99.2] Out: 2825 [Urine:325; Other:2500]    Lab Results:  Recent Labs    11/27/17 0257 11/28/17 0120  WBC 11.6* 11.5*  HGB 7.1* 8.8*  HCT 22.0* 26.4*  PLT 149* 141*   BMET Recent Labs    11/27/17 0257 11/28/17 0120  NA 132* 129*  K 4.2 4.3  CL 95* 94*  CO2 25 25  GLUCOSE 163* 137*  BUN 28* 44*  CREATININE 4.17* 5.65*  CALCIUM 8.2* 8.3*   PT/INR Recent Labs    11/27/17 0257 11/28/17 0048  LABPROT 16.4* 16.3*  INR 1.33 1.32   ABG No results for input(s): PHART, HCO3 in the last 72 hours.  Invalid input(s): PCO2, PO2  Studies/Results: No results found.  Anti-infectives: Anti-infectives (From admission, onward)   Start     Dose/Rate Route Frequency Ordered Stop   11/25/17 1000  amoxicillin-clavulanate (AUGMENTIN) 875-125 MG per tablet 1 tablet  Status:  Discontinued     1 tablet Oral Every 12 hours 11/25/17 0821 11/25/17 0824   11/25/17 1000  amoxicillin-clavulanate (AUGMENTIN) 500-125 MG per tablet 500 mg  Status:  Discontinued     1 tablet Oral Every 12 hours 11/25/17 0824 11/25/17 0825   11/25/17 1000   amoxicillin-clavulanate (AUGMENTIN) 500-125 MG per tablet 500 mg     1 tablet Oral Every 24 hours 11/25/17 0825 12/01/17 2359   11/20/17 0800  ceFAZolin (ANCEF) IVPB 2g/100 mL premix  Status:  Discontinued     2 g 200 mL/hr over 30 Minutes Intravenous To ShortStay Surgical 11/19/17 2312 11/20/17 1054   11/20/17 0600  ceFAZolin (ANCEF) IVPB 2g/100 mL premix  Status:  Discontinued     2 g 200 mL/hr over 30 Minutes Intravenous On call to O.R. 11/19/17 2312 11/19/17 2327      Assessment/Plan: s/p Procedure(s): EXPLORATION AND EXCISION OF LEFT NECK (Left) Continue JP drain.  Will plan to see in office next week for drain removal.  Will not plan to see over weekend unless having problems.   LOS: 10 days    Alnita Aybar,PA-C Plastic Surgery [phone]

## 2017-11-28 NOTE — Progress Notes (Signed)
Dialysis treatment completed.  3000 mL ultrafiltrated.  2500 mL net fluid removal.  Patient status unchanged. Lung sounds diminished to ausculation in all fields. Generalized edema. Cardiac: Afib.  Cleansed LIJ catheter with chlorhexidine.  Disconnected line and flushed port with saline per protocol.  Ports locked with heparin and capped per protocol.   Venous access pulled from LUAVF, pressure held for 10 minutes.  Site bandaged accordingly.  Fistula ++.  Report given to bedside, RN Jarrett Soho.

## 2017-11-28 NOTE — Care Management Note (Addendum)
Case Management Note  Patient Details  Name: Trevor [name] MRN: [id number] Date of Birth: 1946-01-12  Subjective/Objective:   Pt is now s/p excision of squamous cell CA from  Neck and leg                 Action/Plan:   PTA from home independent.  Pt has hx of LVAD implantation 12/2016.  CM will continue to follow for discharge needs   Expected Discharge Date:  11/21/17               Expected Discharge Plan:  Home/Self Care  In-House Referral:     Discharge planning Services  CM Consult  Post Acute Care Choice:    Choice offered to:     DME Arranged:    DME Agency:     HH Arranged:    HH Agency:     Status of Service:     If discussed at [employer], dates discussed:    Additional Comments: 11/28/2017  Pt has JP drain.  Barrier to discharge is INR is not yet at goal.   Plastic surgery PA informed CM that pt will not need [facility] RN for drain management at discharge - bedside nurses will provide teaching to pt regarding drain care - plastic surgery will see pt post discharge to manage drain. Pt will return home with his sister  11/26/17 Pt developed Hematoma post procedure - went to OR today for second attempt at evacuation of hematoma on his neck.  [name], RN 11/28/2017, 10:10 AM

## 2017-11-28 NOTE — Progress Notes (Signed)
LVAD Coordinator Rounding Note:  Admitted1/15/2019 for scheduled inpatient procedure/heparin bridging.S/P excision of squamous cell CA x2 for neck and right leg on 11/20/2017.  Evacuation of neck hematoma 11/26/17  HM II LVAD implanted on 12/13/16 by Dr. Darcey Nora under Destination Therapy criteria.  Vital signs: Temp: 97.5 HR: 64 AFib Doppler Pressure: 95 Automatic BP: 108/86 (94) O2 Sat: 95 % on RA Wt in lbs:179>177>182>187>183>183>190>190>184 lbs  LVAD interrogation reveals:  Speed: 9600 Flow:5.1 Power: 5.8w PI:4.0 Alarms: none Events:>100 PI events Fixed speed: 9600 Low speed limit: 9000  Drive Line: Patient uses weekly kits. Next due 12/04/17. - Dressing dry and intact; anchor intact and accurately applied.   Infection: Augmentin started 11/25/17 for neck wound  Labs:  INR trend:1.90>1.89>1.42>1.33>2.34>1.81>1.4>1.33>1.32  LDH trend: 290>264>239>240>268>261>235>250>256>234>215>233  Anticoagulation Plan: -INR Goal: 2.0 - 2.5 Heparin gtt started 11/26/2017   Device: -Medtronic single lead -Therapies: on - last check: 11/26/2016  Plan/Recommendations:   1.Please page VAD coordinator for equipment issues or patient concerns 2. Dialysis at bedside in [facility] today. 2. VAD weekly dressing change - due next Thursday.  [name] RN, VAD Coordinator 24/7 pager [phone]

## 2017-11-28 NOTE — Progress Notes (Signed)
Patient ID: Trevor [name], male   DOB: 1946/09/26, 72 y.o.   MRN: [id number]   Advanced Heart Failure VAD Team Note  Subjective:   Events  S/p excision of squamous cell CA x 2 from neck and right leg 1/17.   Path shows extension of squamous cell CA to margins at neck site.   1/23 Returned for evacuation hematoma at neck surgery site.  1/24 2UPRBCs  Hgb 7.1  Yesterday he received 2UPRBCs. JP 5 cc drainage.   Feeling much better. Able to walk last night. Denies SOB. No hemoptysis.    LVAD INTERROGATION:  HeartMate II LVAD:  Flow 6.3  liters/min, speed 9600, power 7.7 , PI 4.2  . ~100 PI events. .   Objective:    Vital Signs:   Temp:  [97.8 F (36.6 C)-99 F (37.2 C)] 98.8 F (37.1 C) (01/25 0030) Pulse Rate:  [36-143] 53 (01/24 1700) Resp:  [11-25] 17 (01/25 0600) BP: (73-113)/(42-88) 108/86 (01/25 0428) SpO2:  [70 %-100 %] 93 % (01/25 0030) Weight:  [190 lb (86.2 kg)] 190 lb (86.2 kg) (01/25 0500) Last BM Date: 11/25/17 Mean arterial Pressure 80   Intake/Output:   Intake/Output Summary (Last 24 hours) at 11/28/2017 0712 Last data filed at 11/28/2017 0630 Gross per 24 hour  Intake 1426.27 ml  Output 265 ml  Net 1161.27 ml     Physical Exam    Physical Exam: GENERAL: NAD. In bed.  HEENT: normal  NECK: Neck ecchymotic Supple, JVP  ~10 .  2+ bilaterally, no bruits.  No lymphadenopathy or thyromegaly appreciated.  L JP  CARDIAC:  Mechanical heart sounds with LVAD hum present.  LUNGS:  Clear to auscultation bilaterally.  ABDOMEN:  Soft, round, nontender, positive bowel sounds x4.     LVAD exit site:  Dressing dry and intact.  No erythema or drainage.  Stabilization device present and accurately applied.  Driveline dressing is being changed daily per sterile technique. EXTREMITIES:  Warm and dry, no cyanosis, clubbing, rash. R and LLE 2+  edema  NEUROLOGIC:  Alert and oriented x 4.  Gait steady.  No aphasia.  No dysarthria.  Affect pleasant.       Telemetry   A fib  70s with frequent PVCs. Personally reviewed.     Labs   Basic Metabolic Panel: Recent Labs  Lab 11/24/17 0224 11/25/17 0345 11/26/17 0254 11/27/17 0257 11/28/17 0120  NA 127* 130* 129* 132* 129*  K 4.7 4.5 4.8 4.2 4.3  CL 91* 94* 92* 95* 94*  CO2 24 25 24 25 25   GLUCOSE 177* 185* 166* 163* 137*  BUN 50* 27* 42* 28* 44*  CREATININE 6.51* 4.28* 5.75* 4.17* 5.65*  CALCIUM 8.3* 8.3* 8.5* 8.2* 8.3*  PHOS 5.4* 3.8 4.0 3.1 3.6    Liver Function Tests: Recent Labs  Lab 11/24/17 0224 11/25/17 0345 11/26/17 0254 11/27/17 0257 11/28/17 0120  ALBUMIN 3.5 3.4* 3.4* 2.9* 3.1*   No results for input(s): LIPASE, AMYLASE in the last 168 hours. No results for input(s): AMMONIA in the last 168 hours.  CBC: Recent Labs  Lab 11/24/17 0800 11/25/17 0345 11/25/17 1507 11/25/17 1929 11/26/17 0254 11/27/17 0257 11/28/17 0120  WBC 10.1 9.4 10.4 10.7* 9.9 11.6* 11.5*  NEUTROABS 7.4 7.5  --   --  7.2 9.1* 8.2*  HGB 8.7* 8.6* 8.2* 7.8* 8.2* 7.1* 8.8*  HCT 26.6* 27.0* 25.1* 24.3* 25.9* 22.0* 26.4*  MCV 92.7 94.4 94.4 94.2 94.9 95.7 92.6  PLT 132* 138* 139* 134* 144*  149* 141*    INR: Recent Labs  Lab 11/24/17 0224 11/25/17 0345 11/25/17 1507 11/26/17 0254 11/27/17 0257  INR 2.34 1.81 1.56 1.40 1.33    Other results:     Imaging   No results found.   Medications:     Scheduled Medications: . amoxicillin-clavulanate  1 tablet Oral Q24H  . atorvastatin  40 mg Oral q1800  . budesonide  0.5 mg Inhalation BID  . busPIRone  5 mg Oral BID  . calcitRIOL  0.5 mcg Oral Q M,W,F-HD  . Chlorhexidine Gluconate Cloth  6 each Topical Tomorrow-1000  . darbepoetin (ARANESP) injection - DIALYSIS  100 mcg Intravenous Q Wed-HD  . docusate sodium  100 mg Oral Daily  . ferric citrate  420 mg Oral TID WC  . gabapentin  300 mg Oral BID  . insulin aspart  0-15 Units Subcutaneous TID WC  . insulin aspart  0-5 Units Subcutaneous QHS  . insulin aspart  3 Units Subcutaneous TID WC  .  levothyroxine  25 mcg Oral QAC breakfast  . lidocaine-EPINEPHrine  20 mL Intradermal Once  . multivitamin  1 tablet Oral QHS  . pantoprazole  40 mg Oral Daily  . sildenafil  40 mg Oral TID  . thrombin recombinant  5,000 Units Topical Once  . Warfarin - Pharmacist Dosing Inpatient   Does not apply q1800    Infusions: . sodium chloride 10 mL/hr at 11/27/17 0742  . heparin 1,200 Units/hr (11/28/17 0428)    PRN Medications: acetaminophen, albuterol, ferric citrate, guaiFENesin, lidocaine, ondansetron (ZOFRAN) IV, oxyCODONE, sodium chloride, traMADol, traZODone   Patient Profile   CHA GOMILLION is a 72 y.o. male  history of CAD s/p CABG x 4 2010, OSA, AS with TAVR 2015, ESRD, DM2, paroxysmal atrial fibrillation, asthma, and ischemic cardiomyopathy with Medtronic ICD. 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 Post op period complicated by ARF leading to ESRD and need for chronic dialysis.   Admitted 11/18/17 for heparin bridging for Excision of SCC x 2.   Assessment/Plan:    1. 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  Margins not clear at neck.  -1/23 Evacuation of hematoma.   - - On heparin + coumadin. Discussed with pharmacy and Dr Aundra Dubin.  - Further steps in terms of neck site cancer per surgery.   - Continue Augmentin bid for surgical site.  2. Chronic systolic CHF: Ischemic cardiomyopathy, EF 20-25% with RV dysfunction on 2/18 echo pre-LVAD. 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 Medtronic ICD.  Volume status per HD. Plan for HD today.  - Continue sildenafil 40 TID for RV failure - warfarin goal 2-2.5.  -INR pending.  3. CAD: s/p CABG.  No evidence for ischemia.     4. ESRD: HD needs to be done at bedside with VAD trained personnel present.  Nephrology following.   5. Atrial fibrillation: Chronic. 6. RV failure: Continue Revatio as above.  7. WSF:KCLE stable.  8. Anemia: Baseline anemia of renal disease/chronic disease but recent bleeding from colonic AVMs.  - Hgb 7.1  with surgical site bleeding. Received 2UPRBCs 1/24 Hgb up to 8.8.  9. 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  Off ASA. INR goal 2-2.5.  -  INR pending.  - Continue octreotide as outpatient.  10. H/o TAVR:  Stable on most recent echo.  11. Hemoptysis: ?Bleeding site.  No obvious mouth lesion.  [name] has evaluated neck surgical site, unlikely to be from there.  He says he coughs up the blood.  CT chest did not show lesion that would cause hemoptysis. GI has seen, do not think GI source.  He feels dripping in the back of his throat, possibly blood is coming from sinuses/upper airways.  Seems to have resolved. Continue nasal spray.   Length of Stay: [place], NP 11/28/2017, 7:12 AM  VAD Team --- VAD ISSUES ONLY--- Pager [phone] (7am - 7am)  Advanced Heart Failure Team  Pager [phone] (M-F; 7a - 4p)  Please contact [facility] for night-coverage after hours (4p -7a ) and weekends on [email or web address]  Patient seen with NP, agree with the above note.  2 units PRBCs yesterday with appropriate increase hgb.  Feeling much better.  Walked around unit.  He does not appear to be significantly bleeding from neck site at this point.  No further hemoptysis.   INR 1.3. Coumadin has been restarted.  Continue heparin gtt.   Will get HD in room today.   Will remain in house until INR > 1.8 and heparin stopped.   Loralie Champagne 11/28/2017 8:29 AM

## 2017-11-28 NOTE — Progress Notes (Signed)
Arrived to patient room 2H-25.  Reviewed treatment plan and this RN agrees with plan.  Report received from bedside RN, Jarrett Soho.  Consent verified.  Patient A & O X 4.   Lung sounds diminished to ausculation in all fields. Generalized BLE edema. Cardiac:  Afib, LVAD.  Removed caps and cleansed LIJ catheter with chlorhedxidine.  Aspirated arterial port of heparin and flushed with saline per protocol.  LUAVF cleansed per protocol, venous access obtained via 16 gauge needle.  Connected and secured lines, initiated treatment at 1127.  UF Goal of 3000 mL and net fluid removal 2.5 L.  BP parameters based on MAP per unit LVAD protocol.  Will continue to monitor.

## 2017-11-28 NOTE — Progress Notes (Signed)
ANTICOAGULATION CONSULT NOTE - Follow Up Consult  Pharmacy Consult for heparin Indication: LVAD  Labs: Recent Labs    11/25/17 0345 11/25/17 1507 11/25/17 1929 11/26/17 0254 11/27/17 0257 11/27/17 1534 11/28/17 0048  HGB 8.6* 8.2* 7.8* 8.2* 7.1*  --   --   HCT 27.0* 25.1* 24.3* 25.9* 22.0*  --   --   PLT 138* 139* 134* 144* 149*  --   --   LABPROT 20.9* 18.6*  --  17.1* 16.4*  --   --   INR 1.81 1.56  --  1.40 1.33  --   --   HEPARINUNFRC  --   --   --   --   --  0.14* 0.18*  CREATININE 4.28*  --   --  5.75* 4.17*  --   --     Assessment: 72yo male subtherapeutic on heparin after rate change; RN notes no signs of bleeding.   Goal of Therapy:  Heparin level ~0.25 units/ml   Plan:  Will increase heparin gtt by ~1 unit/kg/hr to 1200 units/hr and check level in 8 hours.   Wynona Neat, PharmD, BCPS  11/28/2017,1:30 AM

## 2017-11-29 LAB — GLUCOSE, CAPILLARY
Glucose-Capillary: 158 mg/dL — ABNORMAL HIGH (ref 65–99)
Glucose-Capillary: 136 mg/dL — ABNORMAL HIGH (ref 65–99)
Glucose-Capillary: 78 mg/dL (ref 65–99)
Glucose-Capillary: 189 mg/dL — ABNORMAL HIGH (ref 65–99)

## 2017-11-29 LAB — CBC WITH DIFFERENTIAL/PLATELET
Basophils Absolute: 0.1 10*3/uL (ref 0.0–0.1)
Basophils Relative: 0 %
EOS PCT: 3 %
Eosinophils Absolute: 0.3 10*3/uL (ref 0.0–0.7)
HEMATOCRIT: 29.8 % — AB (ref 39.0–52.0)
Hemoglobin: 9.6 g/dL — ABNORMAL LOW (ref 13.0–17.0)
LYMPHS ABS: 1.9 10*3/uL (ref 0.7–4.0)
LYMPHS PCT: 15 %
MCH: 30.1 pg (ref 26.0–34.0)
MCHC: 32.2 g/dL (ref 30.0–36.0)
MCV: 93.4 fL (ref 78.0–100.0)
MONO ABS: 1.2 10*3/uL — AB (ref 0.1–1.0)
MONOS PCT: 10 %
NEUTROS ABS: 8.6 10*3/uL — AB (ref 1.7–7.7)
Neutrophils Relative %: 72 %
PLATELETS: 172 10*3/uL (ref 150–400)
RBC: 3.19 MIL/uL — ABNORMAL LOW (ref 4.22–5.81)
RDW: 18.6 % — AB (ref 11.5–15.5)
WBC: 12.1 10*3/uL — ABNORMAL HIGH (ref 4.0–10.5)

## 2017-11-29 LAB — RENAL FUNCTION PANEL
ANION GAP: 11 (ref 5–15)
Albumin: 3.4 g/dL — ABNORMAL LOW (ref 3.5–5.0)
BUN: 23 mg/dL — ABNORMAL HIGH (ref 6–20)
CALCIUM: 8.4 mg/dL — AB (ref 8.9–10.3)
CO2: 25 mmol/L (ref 22–32)
Chloride: 95 mmol/L — ABNORMAL LOW (ref 101–111)
Creatinine, Ser: 4.02 mg/dL — ABNORMAL HIGH (ref 0.61–1.24)
GFR calc Af Amer: 16 mL/min — ABNORMAL LOW (ref >60)
GFR calc non Af Amer: 14 mL/min — ABNORMAL LOW (ref >60)
GLUCOSE: 155 mg/dL — AB (ref 65–99)
Phosphorus: 2.4 mg/dL — ABNORMAL LOW (ref 2.5–4.6)
Potassium: 4 mmol/L (ref 3.5–5.1)
SODIUM: 131 mmol/L — AB (ref 135–145)

## 2017-11-29 LAB — PROTIME-INR
INR: 1.46
Prothrombin Time: 17.6 seconds — ABNORMAL HIGH (ref 11.4–15.2)

## 2017-11-29 LAB — LACTATE DEHYDROGENASE: LDH: 264 U/L — AB (ref 98–192)

## 2017-11-29 LAB — HEPARIN LEVEL (UNFRACTIONATED): HEPARIN UNFRACTIONATED: 0.27 [IU]/mL — AB (ref 0.30–0.70)

## 2017-11-29 MED ORDER — ALTEPLASE 2 MG IJ SOLR: 2.0000 mg | Freq: Once | INTRAMUSCULAR | Status: DC | PRN

## 2017-11-29 MED ORDER — SODIUM CHLORIDE 0.9 % IV SOLN
62.5000 mg | INTRAVENOUS | Status: DC
  Administered 2017-12-01: 62.5 mg via INTRAVENOUS
  Filled 2017-11-29 (×2): qty 5

## 2017-11-29 MED ORDER — HEPARIN SODIUM (PORCINE) 1000 UNIT/ML DIALYSIS: 1000.0000 [IU] | INTRAMUSCULAR | Status: DC | PRN

## 2017-11-29 MED ORDER — PRO-STAT SUGAR FREE PO LIQD
30.0000 mL | Freq: Two times a day (BID) | ORAL | Status: DC
  Administered 2017-11-29 – 2017-12-03 (×8): 30 mL via ORAL
  Filled 2017-11-29 (×9): qty 30

## 2017-11-29 MED ORDER — WARFARIN SODIUM 5 MG PO TABS
5.0000 mg | ORAL_TABLET | Freq: Once | ORAL | Status: AC
  Administered 2017-11-29: 5 mg via ORAL
  Filled 2017-11-29: qty 1

## 2017-11-29 MED ORDER — SODIUM CHLORIDE 0.9 % IV SOLN: 100.0000 mL | INTRAVENOUS | Status: DC | PRN

## 2017-11-29 MED ORDER — PENTAFLUOROPROP-TETRAFLUOROETH EX AERO: 1.0000 "application " | INHALATION_SPRAY | CUTANEOUS | Status: DC | PRN

## 2017-11-29 MED ORDER — LIDOCAINE-PRILOCAINE 2.5-2.5 % EX CREA: 1.0000 "application " | TOPICAL_CREAM | CUTANEOUS | Status: DC | PRN

## 2017-11-29 MED ORDER — LIDOCAINE HCL (PF) 1 % IJ SOLN: 5.0000 mL | INTRAMUSCULAR | Status: DC | PRN

## 2017-11-29 NOTE — Progress Notes (Signed)
Patient ID: Trevor [name], male   DOB: 22-Apr-1946, 72 y.o.   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   Path shows extension of squamous cell CA to margins at neck site.   1/23 Returned for evacuation hematoma at neck surgery site.  1/24 2UPRBCs  Hgb 7.1  Feeling good today, no overt bleeding.  Hgb higher.  Had HD yesterday without problems.  MAP around 90.  JP drain from neck wound not functioning properly.    LVAD INTERROGATION:  HeartMate II LVAD:  Flow 6.8  liters/min, speed 9600, power 6.6, PI 3.5. Many PI events (chronic).   Objective:    Vital Signs:   Temp:  [97.5 F (36.4 C)-99 F (37.2 C)] 98 F (36.7 C) (01/26 0824) Pulse Rate:  [58-81] 77 (01/25 2313) Resp:  [9-25] 9 (01/26 0824) BP: (68-107)/(0-89) 96/84 (01/26 0824) SpO2:  [96 %-98 %] 98 % (01/26 0824) Weight:  [184 lb 8.4 oz (83.7 kg)-190 lb 0.6 oz (86.2 kg)] 184 lb 15.5 oz (83.9 kg) (01/26 0306) Last BM Date: 11/28/17 Mean arterial Pressure 90  Intake/Output:   Intake/Output Summary (Last 24 hours) at 11/29/2017 0834 Last data filed at 11/29/2017 0600 Gross per 24 hour  Intake 262.5 ml  Output 2830 ml  Net -2567.5 ml     Physical Exam    Physical Exam: GENERAL: Well appearing this am. NAD.  HEENT: Normal. NECK: Supple, JVP 8 cm. Carotids OK.  CARDIAC:  Mechanical heart sounds with LVAD hum present.  LUNGS:  CTAB, normal effort.  ABDOMEN:  NT, ND, no HSM. No bruits or masses. +BS  LVAD exit site: Well-healed and incorporated. Dressing dry and intact. No erythema or drainage. Stabilization device present and accurately applied. Driveline dressing changed daily per sterile technique. EXTREMITIES:  Warm and dry. No cyanosis, clubbing, rash, or edema.  NEUROLOGIC:  Alert & oriented x 3. Cranial nerves grossly intact. Moves all 4 extremities w/o difficulty. Affect pleasant   SKIN: Healing neck surgical  site, leg surgical site wrapped.   Telemetry   Atrial fibrillation 70s (personally reviewed).      Labs   Basic Metabolic Panel: Recent Labs  Lab 11/25/17 0345 11/26/17 0254 11/27/17 0257 11/28/17 0120 11/29/17 0711  NA 130* 129* 132* 129* 131*  K 4.5 4.8 4.2 4.3 4.0  CL 94* 92* 95* 94* 95*  CO2 25 24 25 25 25   GLUCOSE 185* 166* 163* 137* 155*  BUN 27* 42* 28* 44* 23*  CREATININE 4.28* 5.75* 4.17* 5.65* 4.02*  CALCIUM 8.3* 8.5* 8.2* 8.3* 8.4*  PHOS 3.8 4.0 3.1 3.6 2.4*    Liver Function Tests: Recent Labs  Lab 11/25/17 0345 11/26/17 0254 11/27/17 0257 11/28/17 0120 11/29/17 0711  ALBUMIN 3.4* 3.4* 2.9* 3.1* 3.4*   No results for input(s): LIPASE, AMYLASE in the last 168 hours. No results for input(s): AMMONIA in the last 168 hours.  CBC: Recent Labs  Lab 11/25/17 0345  11/25/17 1929 11/26/17 0254 11/27/17 0257 11/28/17 0120 11/29/17 0711  WBC 9.4   < > 10.7* 9.9 11.6* 11.5* 12.1*  NEUTROABS 7.5  --   --  7.2 9.1* 8.2* 8.6*  HGB 8.6*   < > 7.8* 8.2* 7.1* 8.8* 9.6*  HCT 27.0*   < > 24.3* 25.9* 22.0* 26.4* 29.8*  MCV 94.4   < > 94.2 94.9 95.7 92.6 93.4  PLT 138*   < >  134* 144* 149* 141* 172   < > = values in this interval not displayed.    INR: Recent Labs  Lab 11/25/17 1507 11/26/17 0254 11/27/17 0257 11/28/17 0048 11/29/17 0711  INR 1.56 1.40 1.33 1.32 1.46    Other results:     Imaging   No results found.   Medications:     Scheduled Medications: . amoxicillin-clavulanate  1 tablet Oral Q24H  . atorvastatin  40 mg Oral q1800  . budesonide  0.5 mg Inhalation BID  . busPIRone  5 mg Oral BID  . calcitRIOL  0.5 mcg Oral Q M,W,F-HD  . Chlorhexidine Gluconate Cloth  6 each Topical Tomorrow-1000  . darbepoetin (ARANESP) injection - DIALYSIS  100 mcg Intravenous Q Wed-HD  . docusate sodium  100 mg Oral Daily  . ferric citrate  420 mg Oral TID WC  . gabapentin  300 mg Oral BID  . insulin aspart  0-15 Units Subcutaneous TID WC  .  insulin aspart  0-5 Units Subcutaneous QHS  . insulin aspart  3 Units Subcutaneous TID WC  . levothyroxine  25 mcg Oral QAC breakfast  . lidocaine-EPINEPHrine  20 mL Intradermal Once  . multivitamin  1 tablet Oral QHS  . pantoprazole  40 mg Oral Daily  . polyethylene glycol  17 g Oral Daily  . sildenafil  40 mg Oral TID  . thrombin recombinant  5,000 Units Topical Once  . Warfarin - Pharmacist Dosing Inpatient   Does not apply q1800    Infusions: . sodium chloride 10 mL/hr at 11/27/17 0742  . heparin 1,250 Units/hr (11/29/17 0302)    PRN Medications: acetaminophen, albuterol, ferric citrate, guaiFENesin, lidocaine, ondansetron (ZOFRAN) IV, oxyCODONE, sodium chloride, traMADol, traZODone   Patient Profile   Trevor [name] is a 72 y.o. male  history of CAD s/p CABG x 4 2010, OSA, AS with TAVR 2015, ESRD, DM2, paroxysmal atrial fibrillation, asthma, and ischemic cardiomyopathy with Medtronic ICD. 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 Post op period complicated by ARF leading to ESRD and need for chronic dialysis.   Admitted 11/18/17 for heparin bridging for Excision of SCC x 2.   Assessment/Plan:    1. 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  Margins not clear at neck. 1/23 Evacuation of hematoma.  JP drain with air leak.  - On heparin + coumadin, INR 1.46.  - Further steps in terms of neck site cancer per surgery.   - Continue Augmentin bid for surgical site.  - Will ask surgery to assess drain, suspect needs replacement.  2. Chronic systolic CHF: Ischemic cardiomyopathy, EF 20-25% with RV dysfunction on 2/18 echo pre-LVAD. 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 Medtronic ICD. Volume status per HD. Plan for HD again today.  - Continue sildenafil 40 TID for RV failure - warfarin goal 2-2.5, up to 1.46 today.  Will remain on heparin gtt until > 1.8.   3. CAD: s/p CABG.  No evidence for ischemia.     4. ESRD: HD needs to be done at bedside with VAD trained personnel present.  Nephrology following.   5. Atrial fibrillation: Chronic. 6. RV failure: Continue Revatio as above.  7. HTN:MAP stable.  8. Anemia: Baseline anemia of renal disease/chronic disease but recent bleeding from colonic AVMs. Hgb down to 7.1 with surgical site bleeding. Received 2UPRBCs 1/24 Hgb up to 9.6 today.  9. 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  Off ASA. INR goal 2-2.5.  -  Continue octreotide as outpatient.  10. H/o TAVR:  Stable on most recent echo.  11. Hemoptysis: ?Bleeding site.  No obvious mouth lesion.  [name] has evaluated neck surgical site, unlikely to be from there.  He says he coughs up the blood.  CT chest did not show lesion that would cause hemoptysis. GI has seen, do not think GI source.  He feels dripping in the back of his throat, possibly blood is coming from sinuses/upper airways.  Seems to have resolved. Continue nasal spray.   Length of Stay: 94  Loralie Champagne, MD 11/29/2017, 8:34 AM  VAD Team --- VAD ISSUES ONLY--- Pager [phone] (7am - 7am)  Advanced Heart Failure Team  Pager [phone] (M-F; 7a - 4p)  Please contact [facility] for night-coverage after hours (4p -7a ) and weekends on [email or web address]

## 2017-11-29 NOTE — Progress Notes (Signed)
Plastics at bedside; removed JP drain to left neck and changed dressing.  Plastics also removed dressing to RLE; new orders for silver alginate to be applied to RLE site.  RLE site is red, inflamed, tender.  Silver alginate has been ordered and site will be dressed according to order when it arrives.  Patient presently accepting visitors at bedside, RLE elevated w/temporary dry dressing intact.

## 2017-11-29 NOTE — Progress Notes (Signed)
Arrived to patient room 2C-02 at 1444.  Reviewed treatment plan and this RN agrees.  Report received from bedside RN, Tammy.  Consent verified.  Patient A & O X 4. Lung sounds diminished and clear to ausculation in all fields. BLE edema. Cardiac: Afib, LVAD.  Prepped LUAVF with alcohol and cannulated with two 17 gauge needles.  Pulsation of blood noted.  Flushed access well with saline per protocol.  Connected and secured lines and initiated tx at 1523.  UF goal of 2400 mL and net fluid removal of 1900 mL.  Will continue to monitor.

## 2017-11-29 NOTE — Progress Notes (Signed)
Dialysis treatment completed.  2400 mL ultrafiltrated and net fluid removal 1900 mL.    Patient status unchanged. Lung sounds diminished to ausculation in all fields. No edema. Cardiac: Afib, LVAD.  Disconnected lines and removed needles.  Pressure held for 10 minutes and band aid/gauze dressing applied.  Report given to bedside RN, Tammy.

## 2017-11-29 NOTE — Progress Notes (Signed)
ANTICOAGULATION CONSULT NOTE  Pharmacy Consult for IV Heparin > warfarin Indication: LVAD  No Active Allergies  Patient Measurements: Height: 5\' 5"  (165.1 cm) Weight: 184 lb 15.5 oz (83.9 kg) IBW/kg (Calculated) : 61.5 Heparin Dosing Weight: 78 kg  Vital Signs: Temp: 98 F (36.7 C) (01/26 0824) Temp Source: Oral (01/26 0824) BP: 96/84 (01/26 0824) Pulse Rate: 77 (01/25 2313)  Labs: Recent Labs    11/27/17 0257  11/28/17 0048 11/28/17 0120 11/28/17 0905 11/29/17 0711  HGB 7.1*  --   --  8.8*  --  9.6*  HCT 22.0*  --   --  26.4*  --  29.8*  PLT 149*  --   --  141*  --  172  LABPROT 16.4*  --  16.3*  --   --  17.6*  INR 1.33  --  1.32  --   --  1.46  HEPARINUNFRC  --    < > 0.18*  --  0.18* 0.27*  CREATININE 4.17*  --   --  5.65*  --  4.02*   < > = values in this interval not displayed.    Estimated Creatinine Clearance: 16.6 mL/min (A) (by C-G formula based on SCr of 4.02 mg/dL (H)).  Assessment: 72 yo male with LVAD, admitted for IV heparin bridge before dermatologic surgery 1/17.  PTA Coumadin dose 7.5 mg daily, last dose taken 1/13 per patient report. Pt is now s/p excision of left neck and right leg > with neck hematoma 1/21> drained in room by plastic surgeon - still oozing in pm so held warfarin and wound repacked> improved 1/22> dressing again changed and evacuated hematoma by plastic surgeon > repacked again 1/22 pm Warfarin held again and pt back to OR 1/23 with oozing sites cauterized.   Warfarin restarted 1/23 - planning to keep INR on lower end Heparin resumed 1/24 - goal heparin level low  Heparin level 0.27 this morning at 1250 units/hr, slightly above goal 0.25 - will continue for now but if continues to rise would reduce to 1200/hr. INR trending up slowly to 1.46 today, per discussion with HF team plan is to increase conservatively given recent hematoma. Hgb improved this morning, no overt S/Sx bleeding per RN.  **PTA warfarin dose = 7.5mg  daily  Goal  of Therapy:  HL 0.2- 0.25ish INR 2-2.5 Monitor platelets by anticoagulation protocol: Yes   Plan:  -Continue heparin 1250 units/hr -Warfarin 5mg  PO x1 tonight -Daily HL, INR, CBC  Arrie Senate, PharmD, BCPS PGY-2 Cardiology Pharmacy Resident Pager: [phone] 11/29/2017

## 2017-11-29 NOTE — Progress Notes (Signed)
[facility] Progress Note   Dialysis Orders: [facility]- [facility] 4.5h   82kg  2K/2Ca  LU AVF/ TDC(using AVF, 2 needles 17ga)  Hep 5800 Mircera 200 mcg IV q2wks, last on 11/05/17 venofer 50mg  IV qwk, last 11/12/17 Calcitriol 0.4mcg PO qHD TID  Last Labs:1/9/19Hgb 12.2, TSAT23%, K4.4, Ca8.9, P6.7, PTH245, Albumin4.2   Assessment/Plan: 1. SCCa L neck and RLE- sp excision by [name] on 1/17; evacuation of hematoma 1/23- drain removed today; RLE  Acell partially incorporated- plastics thinks erythema is due to mositure and edema but at risk for celllulitis 2. ESRD- [facility]FS schedule. 4x /week for Orlin Hilding issues.  Using AVF 2 needles 17ga at center prior to admission- will use 2 needles today.  Cont to access AVF, may be able to pull cath while here. K 4 today - will use 4 K bath - no heparin HD - on coumadin 3. Hypotension/vol - chronic low BP, net UF 2.5 L Friday with post wt 83.9- continue to titrate volume down 4. Anemia- Hgb 9.6 post transfusion 2 unit PRBC 1/24 for hgb of 7.1. Aranesp 140mcg qwk 1/23 last dose- was on 200 Mircera prior to admission - may need higher dose next week- resume weekly Fe-  5. Secondary Hyperparathyroidism -. P 2.4 - if remains low, will need to reduce binders  VDRA. 6. Nutrition- Alb 3.4. Ok on heart healthy diet - have added fluid restriction; Renavite. Add prostat 7. 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 - on coumadin - per cardio/LVAD team  8. CAD s/p CABG - stable, per cardio 9. A fib - on coumadin - per cardio 10. H/o GIB- per primary 11. H/o TAVR -stable, per primary 12. DM - stable, per primary  Myriam Jacobson, PA-C [place] [phone] 11/29/2017,11:54 AM  LOS: 11 days   Pt seen, examined and agree w A/P as above.  Kelly Splinter MD [facility] pager [phone]   11/29/2017, 1:22 PM    Subjective:   No complaints  Objective Vitals:   11/29/17 0824 11/29/17 0851  11/29/17 0854 11/29/17 0857  BP: 96/84  96/84   Pulse:  72 81   Resp: (!) 9  17   Temp: 98 F (36.7 C)     TempSrc: Oral     SpO2: 98%  96% 96%  Weight:      Height:       Physical Exam General: NAD supine in bed Heart: mech heart sound; LVAD hum Lungs grossly clear: Abdomen: obese soft NT Extremities: tr LE edema, right LE mild erythema dressing in place Dialysis Access: left upper AVF  + bruit TDC   Additional Objective Labs: Lab Results  Component Value Date   INR 1.46 11/29/2017   INR 1.32 11/28/2017   INR 1.33 96/75/9163    Basic Metabolic Panel: Recent Labs  Lab 11/27/17 0257 11/28/17 0120 11/29/17 0711  NA 132* 129* 131*  K 4.2 4.3 4.0  CL 95* 94* 95*  CO2 25 25 25   GLUCOSE 163* 137* 155*  BUN 28* 44* 23*  CREATININE 4.17* 5.65* 4.02*  CALCIUM 8.2* 8.3* 8.4*  PHOS 3.1 3.6 2.4*   Liver Function Tests: Recent Labs  Lab 11/27/17 0257 11/28/17 0120 11/29/17 0711  ALBUMIN 2.9* 3.1* 3.4*   CBC: Recent Labs  Lab 11/25/17 1929 11/26/17 0254 11/27/17 0257 11/28/17 0120 11/29/17 0711  WBC 10.7* 9.9 11.6* 11.5* 12.1*  NEUTROABS  --  7.2 9.1* 8.2* 8.6*  HGB 7.8* 8.2* 7.1* 8.8* 9.6*  HCT 24.3* 25.9* 22.0* 26.4* 29.8*  MCV 94.2 94.9 95.7 92.6 93.4  PLT 134* 144* 149* 141* 172   Blood Culture    Component Value Date/Time   SDES URINE, CLEAN CATCH 12/27/2016 1156   SPECREQUEST NONE 12/27/2016 1156   CULT MULTIPLE SPECIES PRESENT, SUGGEST RECOLLECTION (A) 12/27/2016 1156   REPTSTATUS 12/28/2016 FINAL 12/27/2016 1156    Cardiac Enzymes: No results for input(s): CKTOTAL, CKMB, CKMBINDEX, TROPONINI in the last 168 hours. CBG: Recent Labs  Lab 11/28/17 0830 11/28/17 1215 11/28/17 1643 11/28/17 2126 11/29/17 0927  GLUCAP 155* 103* 138* 206* 158*   Iron Studies: No results for input(s): IRON, TIBC, TRANSFERRIN, FERRITIN in the last 72 hours. Lab Results  Component Value Date   INR 1.46 11/29/2017   INR 1.32 11/28/2017   INR 1.33 11/27/2017    Studies/Results: No results found. Medications: . sodium chloride 10 mL/hr at 11/27/17 0742  . heparin 1,250 Units/hr (11/29/17 0843)   . amoxicillin-clavulanate  1 tablet Oral Q24H  . atorvastatin  40 mg Oral q1800  . budesonide  0.5 mg Inhalation BID  . busPIRone  5 mg Oral BID  . calcitRIOL  0.5 mcg Oral Q M,W,F-HD  . Chlorhexidine Gluconate Cloth  6 each Topical Tomorrow-1000  . darbepoetin (ARANESP) injection - DIALYSIS  100 mcg Intravenous Q Wed-HD  . docusate sodium  100 mg Oral Daily  . ferric citrate  420 mg Oral TID WC  . gabapentin  300 mg Oral BID  . insulin aspart  0-15 Units Subcutaneous TID WC  . insulin aspart  0-5 Units Subcutaneous QHS  . insulin aspart  3 Units Subcutaneous TID WC  . levothyroxine  25 mcg Oral QAC breakfast  . lidocaine-EPINEPHrine  20 mL Intradermal Once  . multivitamin  1 tablet Oral QHS  . pantoprazole  40 mg Oral Daily  . polyethylene glycol  17 g Oral Daily  . sildenafil  40 mg Oral TID  . thrombin recombinant  5,000 Units Topical Once  . warfarin  5 mg Oral ONCE-1800  . Warfarin - Pharmacist Dosing Inpatient   Does not apply [phone]

## 2017-11-29 NOTE — Progress Notes (Signed)
   Plastic Surgery  POD#3 exploration left neck, evacuation hematoma  Asked to see patient as drain has stopped holding suction. Also notes erythema right leg. On anticoagulation, heparin gtt at desired goal, INR 1.46.  Drain output 5 cc last 24 hr, 75ml day prior  PE Alert NAD Left neck incision with multiple dried scabs, washed these, there is appr 5 mm opening in incision line with no active drainage. Neck without hematoma Drain scant RLE with edema, periwound erythema, Sorbact removed, partially incorporated A Cell over distal 2/3   A/P: Drain not functioning due to small opening in incision. Drain removed. Observe for signs bleeding as anticoagulation proceeds. Covered this with dry dressing- may remove tomorrow, cover as needed.  RLE - A Cell placed 9 d ago , partially incorporated. Removed sorbact and will start silver alginate to area daily. Keep leg elevated- my opinion on exam today is erythema from moisture and edema but certainly high risk patient for cellulitis.  Irene Limbo, MD [facility]astic [facility] Surgery [phone], pin [phone]

## 2017-11-29 NOTE — Plan of Care (Signed)
LVAD functioning properly without complication.  JP drain discontinued from left neck site; new dressing orders followed for RLE site.  RLE red, tender, and inflamed - physician aware.  Continue to monitor.  INR gradually climbing toward therapeutic range.  Patient remains on IV Heparin gtt while bridging to Coumadin.

## 2017-11-30 LAB — GLUCOSE, CAPILLARY
GLUCOSE-CAPILLARY: 167 mg/dL — AB (ref 65–99)
GLUCOSE-CAPILLARY: 107 mg/dL — AB (ref 65–99)
Glucose-Capillary: 152 mg/dL — ABNORMAL HIGH (ref 65–99)
Glucose-Capillary: 121 mg/dL — ABNORMAL HIGH (ref 65–99)

## 2017-11-30 LAB — HEPARIN LEVEL (UNFRACTIONATED): HEPARIN UNFRACTIONATED: 0.19 [IU]/mL — AB (ref 0.30–0.70)

## 2017-11-30 LAB — CBC
HCT: 28.2 % — ABNORMAL LOW (ref 39.0–52.0)
Hemoglobin: 8.9 g/dL — ABNORMAL LOW (ref 13.0–17.0)
MCH: 29.9 pg (ref 26.0–34.0)
MCHC: 31.6 g/dL (ref 30.0–36.0)
MCV: 94.6 fL (ref 78.0–100.0)
PLATELETS: 169 10*3/uL (ref 150–400)
RBC: 2.98 MIL/uL — AB (ref 4.22–5.81)
RDW: 18.6 % — ABNORMAL HIGH (ref 11.5–15.5)
WBC: 10.3 10*3/uL (ref 4.0–10.5)

## 2017-11-30 LAB — PROTIME-INR
INR: 1.55
Prothrombin Time: 18.4 seconds — ABNORMAL HIGH (ref 11.4–15.2)

## 2017-11-30 LAB — RENAL FUNCTION PANEL
Albumin: 3.1 g/dL — ABNORMAL LOW (ref 3.5–5.0)
Anion gap: 11 (ref 5–15)
BUN: 20 mg/dL (ref 6–20)
CALCIUM: 8.2 mg/dL — AB (ref 8.9–10.3)
CO2: 26 mmol/L (ref 22–32)
CREATININE: 3.55 mg/dL — AB (ref 0.61–1.24)
Chloride: 94 mmol/L — ABNORMAL LOW (ref 101–111)
GFR, EST AFRICAN AMERICAN: 18 mL/min — AB (ref >60)
GFR, EST NON AFRICAN AMERICAN: 16 mL/min — AB (ref >60)
Glucose, Bld: 191 mg/dL — ABNORMAL HIGH (ref 65–99)
Phosphorus: 1.7 mg/dL — ABNORMAL LOW (ref 2.5–4.6)
Potassium: 4.3 mmol/L (ref 3.5–5.1)
SODIUM: 131 mmol/L — AB (ref 135–145)

## 2017-11-30 LAB — LACTATE DEHYDROGENASE: LDH: 259 U/L — AB (ref 98–192)

## 2017-11-30 MED ORDER — WARFARIN SODIUM 7.5 MG PO TABS
7.5000 mg | ORAL_TABLET | Freq: Once | ORAL | Status: AC
  Administered 2017-11-30: 7.5 mg via ORAL
  Filled 2017-11-30: qty 1

## 2017-11-30 NOTE — Plan of Care (Signed)
Patient continues to progress toward overall goals.  Concern over RLE surgical site - remains red, tender and swollen.  Dressing changed per orders.  Left neck surgical site OTA, no further s/sx hematoma.  LVAD function without complication.  INR continues to rise slowly - 1.55 today.  Patient remains on both IV Heparin gtt and PO Coumadin.  Will continue to monitor closely in anticipation of discharge when INR therapeutic.

## 2017-11-30 NOTE — Progress Notes (Signed)
Patient ID: Trevor [name], male   DOB: 1946/03/01, 72 y.o.   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   Path shows extension of squamous cell CA to margins at neck site.   1/23 Returned for evacuation hematoma at neck surgery site.  1/24 2UPRBCs  Hgb 7.1  Feeling good today, no overt bleeding.  Had HD yesterday without problems.  MAP around 90.  JP drain removed from neck yesterday.  Some erythema noted at right lower leg wound.    LVAD INTERROGATION:  HeartMate II LVAD:  Flow 6.2  liters/min, speed 9600, power 6.6, PI 3.9. Many PI events (chronic).   Objective:    Vital Signs:   Temp:  [97.6 F (36.4 C)-98 F (36.7 C)] 98 F (36.7 C) (01/27 0500) Pulse Rate:  [53-84] 81 (01/27 0822) Resp:  [11-23] 17 (01/27 0822) BP: (84-118)/(26-92) 102/92 (01/27 0500) SpO2:  [94 %-100 %] 96 % (01/27 0822) Weight:  [180 lb 12.4 oz (82 kg)-184 lb 15.5 oz (83.9 kg)] 184 lb 4.9 oz (83.6 kg) (01/27 0500) Last BM Date: 11/29/17 Mean arterial Pressure 90  Intake/Output:   Intake/Output Summary (Last 24 hours) at 11/30/2017 0825 Last data filed at 11/30/2017 0500 Gross per 24 hour  Intake 612.5 ml  Output 1900 ml  Net -1287.5 ml     Physical Exam    Physical Exam: GENERAL: Well appearing this am. NAD.  HEENT: Normal. NECK: Supple, JVP 8 cm. Carotids OK.  CARDIAC:  Mechanical heart sounds with LVAD hum present.  LUNGS:  CTAB, normal effort.  ABDOMEN:  NT, ND, no HSM. No bruits or masses. +BS  LVAD exit site: Well-healed and incorporated. Dressing dry and intact. No erythema or drainage. Stabilization device present and accurately applied. Driveline dressing changed daily per sterile technique. EXTREMITIES:  Warm and dry. No cyanosis, clubbing, rash, or edema.  NEUROLOGIC:  Alert & oriented x 3. Cranial nerves grossly intact. Moves all 4 extremities w/o difficulty. Affect pleasant     SKIN: Left neck wound dressed.  Right lower leg wound with surrounding erythema.   Telemetry   Atrial fibrillation 70s (personally reviewed).      Labs   Basic Metabolic Panel: Recent Labs  Lab 11/26/17 0254 11/27/17 0257 11/28/17 0120 11/29/17 0711 11/30/17 0409  NA 129* 132* 129* 131* 131*  K 4.8 4.2 4.3 4.0 4.3  CL 92* 95* 94* 95* 94*  CO2 24 25 25 25 26   GLUCOSE 166* 163* 137* 155* 191*  BUN 42* 28* 44* 23* 20  CREATININE 5.75* 4.17* 5.65* 4.02* 3.55*  CALCIUM 8.5* 8.2* 8.3* 8.4* 8.2*  PHOS 4.0 3.1 3.6 2.4* 1.7*    Liver Function Tests: Recent Labs  Lab 11/26/17 0254 11/27/17 0257 11/28/17 0120 11/29/17 0711 11/30/17 0409  ALBUMIN 3.4* 2.9* 3.1* 3.4* 3.1*   No results for input(s): LIPASE, AMYLASE in the last 168 hours. No results for input(s): AMMONIA in the last 168 hours.  CBC: Recent Labs  Lab 11/25/17 0345  11/26/17 0254 11/27/17 0257 11/28/17 0120 11/29/17 0711 11/30/17 0409  WBC 9.4   < > 9.9 11.6* 11.5* 12.1* 10.3  NEUTROABS 7.5  --  7.2 9.1* 8.2* 8.6*  --   HGB 8.6*   < > 8.2* 7.1* 8.8* 9.6* 8.9*  HCT 27.0*   < > 25.9* 22.0* 26.4* 29.8* 28.2*  MCV 94.4   < >  94.9 95.7 92.6 93.4 94.6  PLT 138*   < > 144* 149* 141* 172 169   < > = values in this interval not displayed.    INR: Recent Labs  Lab 11/26/17 0254 11/27/17 0257 11/28/17 0048 11/29/17 0711 11/30/17 0409  INR 1.40 1.33 1.32 1.46 1.55    Other results:     Imaging   No results found.   Medications:     Scheduled Medications: . amoxicillin-clavulanate  1 tablet Oral Q24H  . atorvastatin  40 mg Oral q1800  . budesonide  0.5 mg Inhalation BID  . busPIRone  5 mg Oral BID  . calcitRIOL  0.5 mcg Oral Q M,W,F-HD  . Chlorhexidine Gluconate Cloth  6 each Topical Tomorrow-1000  . darbepoetin (ARANESP) injection - DIALYSIS  100 mcg Intravenous Q Wed-HD  . docusate sodium  100 mg Oral Daily  . feeding supplement (PRO-STAT SUGAR FREE 64)  30 mL Oral BID  . ferric  citrate  420 mg Oral TID WC  . gabapentin  300 mg Oral BID  . insulin aspart  0-15 Units Subcutaneous TID WC  . insulin aspart  0-5 Units Subcutaneous QHS  . insulin aspart  3 Units Subcutaneous TID WC  . levothyroxine  25 mcg Oral QAC breakfast  . lidocaine-EPINEPHrine  20 mL Intradermal Once  . multivitamin  1 tablet Oral QHS  . pantoprazole  40 mg Oral Daily  . polyethylene glycol  17 g Oral Daily  . sildenafil  40 mg Oral TID  . thrombin recombinant  5,000 Units Topical Once  . warfarin  7.5 mg Oral ONCE-1800  . Warfarin - Pharmacist Dosing Inpatient   Does not apply q1800    Infusions: . sodium chloride 10 mL/hr at 11/27/17 0742  . sodium chloride    . sodium chloride    . [START ON 12/01/2017] ferric gluconate (FERRLECIT/NULECIT) IV    . heparin 1,250 Units/hr (11/30/17 0500)    PRN Medications: sodium chloride, sodium chloride, acetaminophen, albuterol, alteplase, ferric citrate, guaiFENesin, heparin, lidocaine (PF), lidocaine, lidocaine-prilocaine, ondansetron (ZOFRAN) IV, oxyCODONE, pentafluoroprop-tetrafluoroeth, sodium chloride, traMADol, traZODone   Patient Profile   Trevor [name] is a 72 y.o. male  history of CAD s/p CABG x 4 2010, OSA, AS with TAVR 2015, ESRD, DM2, paroxysmal atrial fibrillation, asthma, and ischemic cardiomyopathy with Medtronic ICD. 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 Post op period complicated by ARF leading to ESRD and need for chronic dialysis.   Admitted 11/18/17 for heparin bridging for Excision of SCC x 2.   Assessment/Plan:    1. 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  Margins not clear at neck. 1/23 Evacuation of hematoma.  JP drain with air leak => removed 1/26. Erythema at right leg surgical site => from moisture/edema versus cellulitis.  Afebrile, WBCs lower today.  - On heparin + coumadin, INR 1.55.  - Further steps in terms of neck site cancer per surgery.   - Surgery following right leg erythema, currently on  Augmentin. 2. Chronic systolic CHF: Ischemic cardiomyopathy, EF 20-25% with RV dysfunction on 2/18 echo pre-LVAD. 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 Medtronic ICD. Volume status per HD. Plan for HD again today.  - Continue sildenafil 40 TID for RV failure - warfarin goal 2-2.5, up to 1.55 today.  Will remain on heparin gtt until > 1.8.   3. CAD: s/p CABG.  No evidence for ischemia.     4. ESRD: HD needs to be done at bedside  with VAD trained personnel present. Nephrology following.   5. Atrial fibrillation: Chronic. 6. RV failure: Continue Revatio as above.  7. HTN:MAP stable.  8. Anemia: Baseline anemia of renal disease/chronic disease but recent bleeding from colonic AVMs. Hgb down to 7.1 with surgical site bleeding. Received 2UPRBCs 1/24 Hgb 8.9 today. No overt bleeding. 9. 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  Off ASA. INR goal 2-2.5.  - Continue octreotide as outpatient.  10. H/o TAVR:  Stable on most recent echo.  11. Hemoptysis: ?Bleeding site.  No obvious mouth lesion.  [name] has evaluated neck surgical site, unlikely to be from there.  He says he coughs up the blood.  CT chest did not show lesion that would cause hemoptysis. GI has seen, do not think GI source.  He feels dripping in the back of his throat, possibly blood is coming from sinuses/upper airways.  Seems to have resolved. Continue nasal spray.   Length of Stay: 59  Loralie Champagne, MD 11/30/2017, 8:25 AM  VAD Team --- VAD ISSUES ONLY--- Pager [phone] (7am - 7am)  Advanced Heart Failure Team  Pager [phone] (M-F; 7a - 4p)  Please contact [facility] for night-coverage after hours (4p -7a ) and weekends on [email or web address]

## 2017-11-30 NOTE — Progress Notes (Signed)
[place] for IV Heparin/ Coumadin Indication: LVAD  No Active Allergies  Patient Measurements: Height: 5\' 5"  (165.1 cm) Weight: 184 lb 4.9 oz (83.6 kg) IBW/kg (Calculated) : 61.5 Heparin Dosing Weight: 78 kg  Vital Signs: Temp: 98 F (36.7 C) (01/27 0500) Temp Source: Oral (01/27 0500) BP: 102/92 (01/27 0500) Pulse Rate: 84 (01/27 0500)  Labs: Recent Labs    11/28/17 0048  11/28/17 0120 11/28/17 0905 11/29/17 0711 11/30/17 0409  HGB  --    < > 8.8*  --  9.6* 8.9*  HCT  --   --  26.4*  --  29.8* 28.2*  PLT  --   --  141*  --  172 169  LABPROT 16.3*  --   --   --  17.6* 18.4*  INR 1.32  --   --   --  1.46 1.55  HEPARINUNFRC 0.18*  --   --  0.18* 0.27* 0.19*  CREATININE  --   --  5.65*  --  4.02* 3.55*   < > = values in this interval not displayed.    Assessment: 72 yo male with LVAD, admitted for IV heparin bridge before dermatologic surgery 1/17.  PTA Coumadin dose 7.5 mg daily, last dose taken 1/13 per patient report. Pt is now s/p excision of left neck and right leg > with neck hematoma 1/21> drained in room by plastic surgeon - still oozing in pm so held warfarin and wound repacked> improved 1/22> dressing again changed and evacuated hematoma by plastic surgeon > repacked again 1/22 pm Warfarin held again and pt back to OR 1/23 with oozing sites cauterized.   Warfarin restarted 1/23 - planning to keep INR on lower end Heparin resumed 1/24 - goal heparin level low  Heparin level is 0.19, essentially at goal of ~ 0.2.  INR rising slowly.  **PTA warfarin dose = 7.5mg  daily  Goal of Therapy:  HL 0.2- 0.25ish INR 2-2.5 Monitor platelets by anticoagulation protocol: Yes   Plan:  -Continue heparin 1250 units/hr -Coumadin 7.5 mg po x 1 tonight. -Daily HL, INR, CBC  Uvaldo Rising, BCPS  Clinical Pharmacist Pager [phone]  11/30/2017 7:31 AM

## 2017-11-30 NOTE — Progress Notes (Signed)
[place] for IV Heparin Indication: LVAD  No Active Allergies  Patient Measurements: Height: 5\' 5"  (165.1 cm) Weight: 184 lb 4.9 oz (83.6 kg) IBW/kg (Calculated) : 61.5 Heparin Dosing Weight: 78 kg  Vital Signs: Temp: 98 F (36.7 C) (01/27 0500) Temp Source: Oral (01/27 0500) BP: 102/92 (01/27 0500) Pulse Rate: 84 (01/27 0500)  Labs: Recent Labs    11/28/17 0048  11/28/17 0120 11/28/17 0905 11/29/17 0711 11/30/17 0409  HGB  --    < > 8.8*  --  9.6* 8.9*  HCT  --   --  26.4*  --  29.8* 28.2*  PLT  --   --  141*  --  172 169  LABPROT 16.3*  --   --   --  17.6* 18.4*  INR 1.32  --   --   --  1.46 1.55  HEPARINUNFRC 0.18*  --   --  0.18* 0.27* 0.19*  CREATININE  --   --  5.65*  --  4.02*  --    < > = values in this interval not displayed.    Assessment: 72 yo male with LVAD, admitted for IV heparin bridge before dermatologic surgery 1/17.  PTA Coumadin dose 7.5 mg daily, last dose taken 1/13 per patient report. Pt is now s/p excision of left neck and right leg > with neck hematoma 1/21> drained in room by plastic surgeon - still oozing in pm so held warfarin and wound repacked> improved 1/22> dressing again changed and evacuated hematoma by plastic surgeon > repacked again 1/22 pm Warfarin held again and pt back to OR 1/23 with oozing sites cauterized.   Warfarin restarted 1/23 - planning to keep INR on lower end Heparin resumed 1/24 - goal heparin level low  Heparin level is 0.19, given low goal, I will continue the current rate and let day time [facility] make adjustments if necessary.  **PTA warfarin dose = 7.5mg  daily  Goal of Therapy:  HL 0.2- 0.25ish INR 2-2.5 Monitor platelets by anticoagulation protocol: Yes   Plan:  -Continue heparin 1250 units/hr -Daily HL, INR, CBC    Hughes Better, PharmD, BCPS Clinical Pharmacist 11/30/2017 6:14 AM

## 2017-11-30 NOTE — Progress Notes (Signed)
Pt's left arm fistula having some bright red drainage. Removed old dressing and applied clean gauze with tape. Will continue to monitor

## 2017-11-30 NOTE — Progress Notes (Signed)
[facility] Progress Note   Dialysis Orders: MWFS- [facility] 4.5h   82kg  2K/2Ca  LU AVF/ TDC(using AVF, 2 needles 17ga)  Hep 5800 Mircera 200 mcg IV q2wks, last on 11/05/17 venofer 50mg  IV qwk, last 11/12/17 Calcitriol 0.6mcg PO qHD TID  Last Labs:1/9/19Hgb 12.2, TSAT23%, K4.4, Ca8.9, P6.7, PTH245, Albumin4.2   Assessment: 1. SCCa L neck and RLE- sp excision by [name] on 1/17; evacuation of hematoma 1/23 2. ESRD- MWFS schedule. 4x /week for Orlin Hilding issues.  Using AVF 2 needles 17ga at center prior to admission- cont to access AVF, may be able to pull cath while here.  No heparin for now w HD.  3. Hypotension/vol - stable, no sig vol excess 4. Anemia- Hb 8.9, got 2 unit PRBC 1/24 for hgb of 7.1. Aranesp 193mcg qwk 1/23 last dose- was on 200 Mircera prior to admission - may need higher dose next week- resume weekly Fe-  5. Secondary Hyperparathyroidism -. P 2.4 - if remains low, will need to reduce binders  VDRA. 6. Nutrition- Alb 3.4. Ok on heart healthy diet - have added fluid restriction; Renavite. Add prostat 7. 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 - on coumadin - per cardio/LVAD team  8. CAD s/p CABG - stable, per cardio 9. A fib - on coumadin - per cardio 10. H/o GIB- per primary 11. H/o TAVR -stable, per primary 12. DM - stable, per primary   P - HD Monday, UF to dry  Kelly Splinter MD [place] pager [phone]   11/30/2017, 1:01 PM    Subjective:   No complaints  Objective Vitals:   11/30/17 0822 11/30/17 0825 11/30/17 0842 11/30/17 1227  BP:   (!) 86/69 100/83  Pulse: 81  (!) 104 74  Resp: 17  (!) 25 12  Temp:   98.5 F (36.9 C) 97.6 F (36.4 C)  TempSrc:   Oral Oral  SpO2: 96% 96% 93% 93%  Weight:      Height:       Physical Exam General: NAD supine in bed Heart: mech heart sound; LVAD hum Lungs grossly clear: Abdomen: obese soft NT Extremities: tr LE edema, right LE mild erythema dressing  in place Dialysis Access: left upper AVF  + bruit TDC   Additional Objective Labs: Lab Results  Component Value Date   INR 1.55 11/30/2017   INR 1.46 11/29/2017   INR 1.32 44/31/5400    Basic Metabolic Panel: Recent Labs  Lab 11/28/17 0120 11/29/17 0711 11/30/17 0409  NA 129* 131* 131*  K 4.3 4.0 4.3  CL 94* 95* 94*  CO2 25 25 26   GLUCOSE 137* 155* 191*  BUN 44* 23* 20  CREATININE 5.65* 4.02* 3.55*  CALCIUM 8.3* 8.4* 8.2*  PHOS 3.6 2.4* 1.7*   Liver Function Tests: Recent Labs  Lab 11/28/17 0120 11/29/17 0711 11/30/17 0409  ALBUMIN 3.1* 3.4* 3.1*   CBC: Recent Labs  Lab 11/26/17 0254 11/27/17 0257 11/28/17 0120 11/29/17 0711 11/30/17 0409  WBC 9.9 11.6* 11.5* 12.1* 10.3  NEUTROABS 7.2 9.1* 8.2* 8.6*  --   HGB 8.2* 7.1* 8.8* 9.6* 8.9*  HCT 25.9* 22.0* 26.4* 29.8* 28.2*  MCV 94.9 95.7 92.6 93.4 94.6  PLT 144* 149* 141* 172 169   Blood Culture    Component Value Date/Time   SDES URINE, CLEAN CATCH 12/27/2016 1156   SPECREQUEST NONE 12/27/2016 1156   CULT MULTIPLE SPECIES PRESENT, SUGGEST RECOLLECTION (A) 12/27/2016 1156   REPTSTATUS 12/28/2016 FINAL 12/27/2016 1156  Cardiac Enzymes: No results for input(s): CKTOTAL, CKMB, CKMBINDEX, TROPONINI in the last 168 hours. CBG: Recent Labs  Lab 11/29/17 1201 11/29/17 1632 11/29/17 2108 11/30/17 0838 11/30/17 1221  GLUCAP 136* 78 189* 167* 152*   Iron Studies: No results for input(s): IRON, TIBC, TRANSFERRIN, FERRITIN in the last 72 hours. Lab Results  Component Value Date   INR 1.55 11/30/2017   INR 1.46 11/29/2017   INR 1.32 11/28/2017   Studies/Results: No results found. Medications: . sodium chloride 10 mL/hr at 11/27/17 0742  . sodium chloride    . sodium chloride    . [START ON 12/01/2017] ferric gluconate (FERRLECIT/NULECIT) IV    . heparin 1,250 Units/hr (11/30/17 1300)   . amoxicillin-clavulanate  1 tablet Oral Q24H  . atorvastatin  40 mg Oral q1800  . budesonide  0.5 mg  Inhalation BID  . busPIRone  5 mg Oral BID  . calcitRIOL  0.5 mcg Oral Q M,W,F-HD  . Chlorhexidine Gluconate Cloth  6 each Topical Tomorrow-1000  . darbepoetin (ARANESP) injection - DIALYSIS  100 mcg Intravenous Q Wed-HD  . docusate sodium  100 mg Oral Daily  . feeding supplement (PRO-STAT SUGAR FREE 64)  30 mL Oral BID  . ferric citrate  420 mg Oral TID WC  . gabapentin  300 mg Oral BID  . insulin aspart  0-15 Units Subcutaneous TID WC  . insulin aspart  0-5 Units Subcutaneous QHS  . insulin aspart  3 Units Subcutaneous TID WC  . levothyroxine  25 mcg Oral QAC breakfast  . lidocaine-EPINEPHrine  20 mL Intradermal Once  . multivitamin  1 tablet Oral QHS  . pantoprazole  40 mg Oral Daily  . polyethylene glycol  17 g Oral Daily  . sildenafil  40 mg Oral TID  . thrombin recombinant  5,000 Units Topical Once  . warfarin  7.5 mg Oral ONCE-1800  . Warfarin - Pharmacist Dosing Inpatient   Does not apply [phone]

## 2017-12-01 LAB — GLUCOSE, CAPILLARY
GLUCOSE-CAPILLARY: 147 mg/dL — AB (ref 65–99)
GLUCOSE-CAPILLARY: 193 mg/dL — AB (ref 65–99)
Glucose-Capillary: 124 mg/dL — ABNORMAL HIGH (ref 65–99)
Glucose-Capillary: 144 mg/dL — ABNORMAL HIGH (ref 65–99)

## 2017-12-01 LAB — CBC
HCT: 28.2 % — ABNORMAL LOW (ref 39.0–52.0)
Hemoglobin: 9 g/dL — ABNORMAL LOW (ref 13.0–17.0)
MCH: 30.4 pg (ref 26.0–34.0)
MCHC: 31.9 g/dL (ref 30.0–36.0)
MCV: 95.3 fL (ref 78.0–100.0)
Platelets: 162 10*3/uL (ref 150–400)
RBC: 2.96 MIL/uL — ABNORMAL LOW (ref 4.22–5.81)
RDW: 18.7 % — AB (ref 11.5–15.5)
WBC: 11.4 10*3/uL — ABNORMAL HIGH (ref 4.0–10.5)

## 2017-12-01 LAB — RENAL FUNCTION PANEL
ALBUMIN: 3.3 g/dL — AB (ref 3.5–5.0)
Anion gap: 11 (ref 5–15)
BUN: 35 mg/dL — AB (ref 6–20)
CO2: 26 mmol/L (ref 22–32)
Calcium: 8.5 mg/dL — ABNORMAL LOW (ref 8.9–10.3)
Chloride: 95 mmol/L — ABNORMAL LOW (ref 101–111)
Creatinine, Ser: 4.5 mg/dL — ABNORMAL HIGH (ref 0.61–1.24)
GFR calc Af Amer: 14 mL/min — ABNORMAL LOW (ref >60)
GFR calc non Af Amer: 12 mL/min — ABNORMAL LOW (ref >60)
GLUCOSE: 164 mg/dL — AB (ref 65–99)
PHOSPHORUS: 2.5 mg/dL (ref 2.5–4.6)
POTASSIUM: 4.5 mmol/L (ref 3.5–5.1)
Sodium: 132 mmol/L — ABNORMAL LOW (ref 135–145)

## 2017-12-01 LAB — PROTIME-INR
INR: 1.47
Prothrombin Time: 17.7 seconds — ABNORMAL HIGH (ref 11.4–15.2)

## 2017-12-01 LAB — LACTATE DEHYDROGENASE: LDH: 274 U/L — ABNORMAL HIGH (ref 98–192)

## 2017-12-01 LAB — HEPARIN LEVEL (UNFRACTIONATED)
HEPARIN UNFRACTIONATED: 0.7 [IU]/mL (ref 0.30–0.70)
HEPARIN UNFRACTIONATED: 0.16 [IU]/mL — AB (ref 0.30–0.70)
Heparin Unfractionated: 0.16 IU/mL — ABNORMAL LOW (ref 0.30–0.70)

## 2017-12-01 MED ORDER — PIPERACILLIN-TAZOBACTAM 3.375 G IVPB
3.3750 g | Freq: Two times a day (BID) | INTRAVENOUS | Status: DC
  Administered 2017-12-01 – 2017-12-02 (×2): 3.375 g via INTRAVENOUS
  Filled 2017-12-01 (×3): qty 50

## 2017-12-01 MED ORDER — WARFARIN SODIUM 10 MG PO TABS
10.0000 mg | ORAL_TABLET | Freq: Once | ORAL | Status: AC
  Administered 2017-12-01: 10 mg via ORAL
  Filled 2017-12-01: qty 1

## 2017-12-01 MED ORDER — HEPARIN (PORCINE) IN NACL 100-0.45 UNIT/ML-% IJ SOLN
1300.0000 [IU]/h | INTRAMUSCULAR | Status: DC
  Filled 2017-12-01: qty 250

## 2017-12-01 MED ORDER — DARBEPOETIN ALFA 200 MCG/0.4ML IJ SOSY: 200.0000 ug | PREFILLED_SYRINGE | INTRAMUSCULAR | Status: DC

## 2017-12-01 MED ORDER — SODIUM CHLORIDE 0.9 % IV SOLN
1750.0000 mg | Freq: Once | INTRAVENOUS | Status: AC
  Administered 2017-12-01: 1750 mg via INTRAVENOUS
  Filled 2017-12-01: qty 1750

## 2017-12-01 NOTE — Progress Notes (Signed)
[name] NP in and changed right leg dressing. Possible cellulitis, she will review with [name].

## 2017-12-01 NOTE — Progress Notes (Signed)
Pharmacy Antibiotic Note  Trevor [name] is a 72 y.o. male  With cellulitis  ESRD on HD, VAD  Plan: Zosyn 3.375 gm iv q12 vanc 1750 mg x 1 Will f/u HD schedule for further dosing  Height: 5\' 5"  (165.1 cm) Weight: 182 lb 8.7 oz (82.8 kg) IBW/kg (Calculated) : 61.5  Temp (24hrs), Avg:98.2 F (36.8 C), Min:97.8 F (36.6 C), Max:98.5 F (36.9 C)  Recent Labs  Lab 11/27/17 0257 11/28/17 0120 11/29/17 0711 11/30/17 0409 12/01/17 0453  WBC 11.6* 11.5* 12.1* 10.3 11.4*  CREATININE 4.17* 5.65* 4.02* 3.55* 4.50*    Estimated Creatinine Clearance: 14.7 mL/min (A) (by C-G formula based on SCr of 4.5 mg/dL (H)).    No Active Allergies  Levester Fresh, PharmD, BCPS, BCCCP Clinical Pharmacist Clinical phone for 12/01/2017 from [phone]: [phone] If after 2300, please call main pharmacy at: [phone] 12/01/2017 3:48 PM

## 2017-12-01 NOTE — Plan of Care (Signed)
Patient continues to progress; IV antibiotics started today for RLE surgical site.  Patient remains on IV Heparin gtt and PO Coumadin to bridge; await therapeutic INR.  Will continue to monitor.

## 2017-12-01 NOTE — Progress Notes (Signed)
Arrived in room approximated 0715. Pt has a left arm AVF and venoius site appears swollen.  This writer  Has difficulty cannulating venous site. Assistance arrived but to no avail could not access the venous site as well. Nephrologist paged and made aware of above and ok to access HD cath for HD tx. And ice packed provided to left arm AVF. Pt alert/oriented in no acute distress. No complaints voiced.

## 2017-12-01 NOTE — Progress Notes (Signed)
ANTICOAGULATION CONSULT NOTE  Pharmacy Consult for IV Heparin Indication: LVAD  No Active Allergies  Patient Measurements: Height: 5\' 5"  (165.1 cm) Weight: 182 lb 8.7 oz (82.8 kg) IBW/kg (Calculated) : 61.5 Heparin Dosing Weight: 78 kg  Vital Signs: Temp: 99 F (37.2 C) (01/28 1938) Temp Source: Oral (01/28 1938) BP: 82/58 (01/28 2005) Pulse Rate: 66 (01/28 1938)  Labs: Recent Labs    11/29/17 0711 11/30/17 0409 12/01/17 0453 12/01/17 1247 12/01/17 2221  HGB 9.6* 8.9* 9.0*  --   --   HCT 29.8* 28.2* 28.2*  --   --   PLT 172 169 162  --   --   LABPROT 17.6* 18.4* 17.7*  --   --   INR 1.46 1.55 1.47  --   --   HEPARINUNFRC 0.27* 0.19* 0.16* 0.70 0.16*  CREATININE 4.02* 3.55* 4.50*  --   --     Assessment: 72 yo male with LVAD, admitted for IV heparin bridge before dermatologic surgery 1/17.  PTA Coumadin dose 7.5 mg daily, last dose taken 1/13 per patient report. Pt is now s/p excision of left neck and right leg > with neck hematoma 1/21> drained in room by plastic surgeon - still oozing in pm so held warfarin and wound repacked> improved. 1/22> dressing again changed and evacuated hematoma by plastic surgeon > repacked again 1/22 pm Warfarin held again and pt back to OR 1/23 with oozing sites cauterized.  **PTA warfarin dose = 7.5mg  daily  Warfarin restarted 1/23 - planning to keep INR on lower end Heparin resumed 1/24 - goal heparin level low  Heparin level now SUBtherapeutic. Previous level of 0.7 likely drawn incorrectly.  Goal of Therapy:  HL 0.2- 0.25ish INR 2-2.5 Monitor platelets by anticoagulation protocol: Yes   Plan:  -Increase heparin to 1350 units/hr -Check level in 8 hours -Daily HL, CBC  Harvel Quale  12/01/2017 11:25 PM

## 2017-12-01 NOTE — Procedures (Signed)
I have personally attended this patient's dialysis session.   Pre weight 85.3 Orders were for standing weight 82 max UF 2.5 so will not be to EDW (which may need to be lower) 2K bath Unable to cannulate AVF so using [facility] Will need to rest AVF to allow hematoma to resolve (prolonged post HD bleeding from Saturday)  Jamal Maes, MD [place] Pager 12/01/2017, 11:34 AM

## 2017-12-01 NOTE — Progress Notes (Signed)
ANTICOAGULATION CONSULT NOTE  Pharmacy Consult for IV Heparin Indication: LVAD  No Active Allergies  Patient Measurements: Height: 5\' 5"  (165.1 cm) Weight: 182 lb 8.7 oz (82.8 kg) IBW/kg (Calculated) : 61.5 Heparin Dosing Weight: 78 kg  Vital Signs: Temp: 97.8 F (36.6 C) (01/28 1246) Temp Source: Oral (01/28 1246) BP: 92/60 (01/28 1246) Pulse Rate: 83 (01/28 1246)  Labs: Recent Labs    11/29/17 0711 11/30/17 0409 12/01/17 0453 12/01/17 1247  HGB 9.6* 8.9* 9.0*  --   HCT 29.8* 28.2* 28.2*  --   PLT 172 169 162  --   LABPROT 17.6* 18.4* 17.7*  --   INR 1.46 1.55 1.47  --   HEPARINUNFRC 0.27* 0.19* 0.16* 0.70  CREATININE 4.02* 3.55* 4.50*  --     Assessment: 72 yo male with LVAD, admitted for IV heparin bridge before dermatologic surgery 1/17.  PTA Coumadin dose 7.5 mg daily, last dose taken 1/13 per patient report. Pt is now s/p excision of left neck and right leg > with neck hematoma 1/21> drained in room by plastic surgeon - still oozing in pm so held warfarin and wound repacked> improved. 1/22> dressing again changed and evacuated hematoma by plastic surgeon > repacked again 1/22 pm Warfarin held again and pt back to OR 1/23 with oozing sites cauterized.  **PTA warfarin dose = 7.5mg  daily  Warfarin restarted 1/23 - planning to keep INR on lower end Heparin resumed 1/24 - goal heparin level low  Heparin level this afternoon came back elevated at 0.7, on 1350 units/hr. Heparin is running in R forearm. Unclear where heparin level was drawn from. Hgb and platelets are stable. INR is 1.47 today from 1.55 yesterday.  Goal of Therapy:  HL 0.2- 0.25ish INR 2-2.5 Monitor platelets by anticoagulation protocol: Yes   Plan:  -Decrease heparin to 1250 units/hr -Order warfarin 10 mg x1 -Check level in 8 hours -Daily HL, INR, CBC  Doylene Canard, PharmD Clinical Pharmacist  Pager: [phone] Clinical Phone for 12/01/2017 until 3:30pm: [phone] If after 3:30pm, please  call main pharmacy at [phone]

## 2017-12-01 NOTE — Progress Notes (Signed)
[facility] Progress Note   Subjective:    Having some ^ pain in R leg Hoping to see plastics today as there is by report some concern about possible cellulitis On HD at this time Unable to cannulate AVF today so using TDC  Objective Vitals:   12/01/17 1000 12/01/17 1030 12/01/17 1045 12/01/17 1100  BP: (!) 79/37 (!) 79/49 (!) 62/31 (!) 126/36  Pulse: 68 76 63 67  Resp:    18  Temp:      TempSrc:      SpO2:      Weight:      Height:       Physical Exam Seen in bed, NAD, on HD VS as noted Large scar left neck up to ear with dried blood Mechanical heart sounds/LVAD hum Lungs clear Abd soft and not tender R calf bandaged (not removed, says hurts) LLE 1+ pitting edema L AVF extensive hematoma, + bruit TDC in use  Dialysis Orders: [facility] 4.5h    82kg   2K/2Ca   LU AVF/ TDC(using AVF, 2 needles 17ga)   Hep 5800 Mircera 200 mcg IV q2wks, last on 11/05/17 venofer 50mg  IV qwk, last 11/12/17 Calcitriol 0.21mcg PO qHD TID  Last Labs:1/9/19Hgb 12.2, TSAT23%, K4.4, Ca8.9, P6.7, PTH245, Albumin4.2   Assessment:  1. SCCa L neck and RLE- sp excision by [name] on 1/17; evacuation of neck hematoma 1/23. Needs to come back and look at RLE - ^ pain, some concern re cellulitus 2. ESRD- [facility] schedule. 4x /week for Orlin Hilding issues.  Using AVF 2 needles 17ga at center prior to admission -had post HD bleeding on Saturday, hematoma today, unable to cannulate today, so using TDC and ice to AVF. May need to rest for several days to allow hematoma to resolve. No heparin with HD for now.  3. Hypotension/vol - pre HD weight 85.3. EDW 82. Was only written for 2 liter goal. Try to get to EDW on Wednesday w/TMT.  4. Anemia- 2 unit PRBC 1/24 for hgb of 7.1. Aranesp 154mcg qwk 1/23 last dose. ^ to 200. Resumed weekly Fe. 5. Secondary Hyperparathyroidism -last phos 2.4.  6. Nutrition- Alb 3.4. Ok on heart healthy diet - have added  fluid restriction;  Renavite. Added prostat 7. 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 - on coumadin - per cardio/LVAD team  8. CAD s/p CABG - stable, per cardio 9. A fib - on coumadin - per cardio 10. H/o GIB- per primary 11. H/o TAVR -stable, per primary 12. DM - stable, per primary  Jamal Maes, MD [facility] [phone] Pager 12/01/2017, 11:10 AM   Additional Objective  Lab Results  Component Value Date   INR 1.47 12/01/2017   INR 1.55 11/30/2017   INR 1.46 37/16/9678    Basic Metabolic Panel: Recent Labs  Lab 11/29/17 0711 11/30/17 0409 12/01/17 0453  NA 131* 131* 132*  K 4.0 4.3 4.5  CL 95* 94* 95*  CO2 25 26 26   GLUCOSE 155* 191* 164*  BUN 23* 20 35*  CREATININE 4.02* 3.55* 4.50*  CALCIUM 8.4* 8.2* 8.5*  PHOS 2.4* 1.7* 2.5    Recent Labs  Lab 11/29/17 0711 11/30/17 0409 12/01/17 0453  ALBUMIN 3.4* 3.1* 3.3*   Recent Labs  Lab 11/27/17 0257 11/28/17 0120 11/29/17 0711 11/30/17 0409 12/01/17 0453  WBC 11.6* 11.5* 12.1* 10.3 11.4*  NEUTROABS 9.1* 8.2* 8.6*  --   --   HGB 7.1* 8.8* 9.6* 8.9* 9.0*  HCT 22.0* 26.4*  29.8* 28.2* 28.2*  MCV 95.7 92.6 93.4 94.6 95.3  PLT 149* 141* 172 169 162    Recent Labs  Lab 11/30/17 0838 11/30/17 1221 11/30/17 1649 11/30/17 2118 12/01/17 0823  GLUCAP 167* 152* 121* 107* 147*   Lab Results  Component Value Date   INR 1.47 12/01/2017   INR 1.55 11/30/2017   INR 1.46 11/29/2017   Medications: . sodium chloride 10 mL/hr at 11/27/17 0742  . sodium chloride    . sodium chloride    . ferric gluconate (FERRLECIT/NULECIT) IV 62.5 mg (12/01/17 1146)  . heparin 1,350 Units/hr (12/01/17 0715)   . amoxicillin-clavulanate  1 tablet Oral Q24H  . atorvastatin  40 mg Oral q1800  . budesonide  0.5 mg Inhalation BID  . busPIRone  5 mg Oral BID  . calcitRIOL  0.5 mcg Oral Q M,W,F-HD  . Chlorhexidine Gluconate Cloth  6 each Topical Tomorrow-1000  . darbepoetin (ARANESP) injection - DIALYSIS  100 mcg Intravenous  Q Wed-HD  . docusate sodium  100 mg Oral Daily  . feeding supplement (PRO-STAT SUGAR FREE 64)  30 mL Oral BID  . ferric citrate  420 mg Oral TID WC  . gabapentin  300 mg Oral BID  . insulin aspart  0-15 Units Subcutaneous TID WC  . insulin aspart  0-5 Units Subcutaneous QHS  . insulin aspart  3 Units Subcutaneous TID WC  . levothyroxine  25 mcg Oral QAC breakfast  . lidocaine-EPINEPHrine  20 mL Intradermal Once  . multivitamin  1 tablet Oral QHS  . pantoprazole  40 mg Oral Daily  . polyethylene glycol  17 g Oral Daily  . sildenafil  40 mg Oral TID  . thrombin recombinant  5,000 Units Topical Once  . Warfarin - Pharmacist Dosing Inpatient   Does not apply [phone]

## 2017-12-01 NOTE — Progress Notes (Signed)
LVAD Coordinator Rounding Note:  Admitted1/15/2019 for scheduled inpatient procedure/heparin bridging.S/P excision of squamous cell CA x2 for neck and right leg on 11/20/2017.  Evacuation of neck hematoma 11/26/17  HM II LVAD implanted on 12/13/16 by Dr. Darcey Nora under Destination Therapy criteria.  Nurse reports right leg wound with increased redness around site. [name] plans on assessing later today.  HD nurse at beside, unable to use left arm AVF - site swollen. Will use HD catheter for HD today.  Vital signs: Temp: 98.2 HR:  95 Doppler Pressure:not done Automatic BP: 100/59 (73) O2 Sat:  95 % on RA Wt in lbs:179>177>182>187>183>183>190>190>184>188>188 lbs  LVAD interrogation reveals:  Speed: 9600 Flow:5.5 Power: 6.3w PI:4.6 Alarms: one low voltage advisory Events:2 PI events Fixed speed: 9600 Low speed limit: 9000  Drive Line: Patient uses weekly kits. Next due 12/04/17. - Dressing dry and intact; anchor intact and accurately applied.   Infection: Augmentin started 11/25/17 for neck wound  Labs:  INR trend:1.90>1.89>1.42>1.33>2.34>1.81>1.4>1.33>1.32>1.46>1.55>1.47  LDH trend: 290>264>239>240>268>261>235>250>256>234>215>233>264>259>274  Anticoagulation Plan: - INR Goal: 2.0 - 2.5 - Coumadin stopped 11/26/17 - Heparin gtt started 11/26/2017 - managed per PharmD  Device: - Medtronic single lead  -Therapies: on - last check: 11/26/2017  Plan/Recommendations:   1.Please page VAD coordinator for equipment issues or patient concerns 2. Dialysis at bedside in [facility] today. 2. VAD weekly dressing change - due 12/04/17.  [name] RN, VAD Coordinator 24/7 pager [phone]

## 2017-12-01 NOTE — Progress Notes (Signed)
Patient ID: Trevor [name], male   DOB: 05-04-1946, 72 y.o.   MRN: [id number]   Advanced Heart Failure VAD Team Note  Subjective:   Events  S/p excision of squamous cell CA x 2 from neck and right leg 1/17.   Path shows extension of squamous cell CA to margins at neck site.   1/23 Returned for evacuation hematoma at neck surgery site.  1/24 2UPRBCs  Hgb 7.1  Getting HD today.  Increased pain right lower leg surgical site, nursing concerned for possible cellulitis, site remains dressed pending evaluation by surgery today.    INR still low and he is on IV heparin.  Hgb stable.     LVAD INTERROGATION:  HeartMate II LVAD:  Flow 5.5  liters/min, speed 9600, power 6.2, PI 4.   Objective:    Vital Signs:   Temp:  [97.6 F (36.4 C)-98.5 F (36.9 C)] 98.2 F (36.8 C) (01/28 0340) Pulse Rate:  [60-77] 72 (01/28 0821) Resp:  [12-14] 14 (01/28 0340) BP: (95-109)/(48-85) 95/48 (01/28 0821) SpO2:  [93 %-96 %] 95 % (01/28 0846) Weight:  [188 lb 0.8 oz (85.3 kg)] 188 lb 0.8 oz (85.3 kg) (01/28 0340) Last BM Date: 11/29/17 Mean arterial Pressure 80s-90s  Intake/Output:   Intake/Output Summary (Last 24 hours) at 12/01/2017 0912 Last data filed at 12/01/2017 0416 Gross per 24 hour  Intake 544.59 ml  Output -  Net 544.59 ml     Physical Exam    Physical Exam: GENERAL: Well appearing this am. NAD.  HEENT: Normal. NECK: Supple, JVP 8-9 cm. Carotids OK.  CARDIAC:  Mechanical heart sounds with LVAD hum present.  LUNGS:  CTAB, normal effort.  ABDOMEN:  NT, ND, no HSM. No bruits or masses. +BS  LVAD exit site: Well-healed and incorporated. Dressing dry and intact. No erythema or drainage. Stabilization device present and accurately applied. Driveline dressing changed daily per sterile technique. EXTREMITIES:  Warm and dry. No cyanosis, clubbing, rash, or edema.  NEUROLOGIC:  Alert & oriented x 3. Cranial nerves grossly intact. Moves all 4 extremities w/o difficulty. Affect pleasant   SKIN:  Left neck surgical site benign.  Right lower leg site dressed.    Telemetry   Atrial fibrillation 70s (personally reviewed).      Labs   Basic Metabolic Panel: Recent Labs  Lab 11/27/17 0257 11/28/17 0120 11/29/17 0711 11/30/17 0409 12/01/17 0453  NA 132* 129* 131* 131* 132*  K 4.2 4.3 4.0 4.3 4.5  CL 95* 94* 95* 94* 95*  CO2 25 25 25 26 26   GLUCOSE 163* 137* 155* 191* 164*  BUN 28* 44* 23* 20 35*  CREATININE 4.17* 5.65* 4.02* 3.55* 4.50*  CALCIUM 8.2* 8.3* 8.4* 8.2* 8.5*  PHOS 3.1 3.6 2.4* 1.7* 2.5    Liver Function Tests: Recent Labs  Lab 11/27/17 0257 11/28/17 0120 11/29/17 0711 11/30/17 0409 12/01/17 0453  ALBUMIN 2.9* 3.1* 3.4* 3.1* 3.3*   No results for input(s): LIPASE, AMYLASE in the last 168 hours. No results for input(s): AMMONIA in the last 168 hours.  CBC: Recent Labs  Lab 11/25/17 0345  11/26/17 0254 11/27/17 0257 11/28/17 0120 11/29/17 0711 11/30/17 0409 12/01/17 0453  WBC 9.4   < > 9.9 11.6* 11.5* 12.1* 10.3 11.4*  NEUTROABS 7.5  --  7.2 9.1* 8.2* 8.6*  --   --   HGB 8.6*   < > 8.2* 7.1* 8.8* 9.6* 8.9* 9.0*  HCT 27.0*   < > 25.9* 22.0* 26.4* 29.8* 28.2* 28.2*  MCV 94.4   < > 94.9 95.7 92.6 93.4 94.6 95.3  PLT 138*   < > 144* 149* 141* 172 169 162   < > = values in this interval not displayed.    INR: Recent Labs  Lab 11/27/17 0257 11/28/17 0048 11/29/17 0711 11/30/17 0409 12/01/17 0453  INR 1.33 1.32 1.46 1.55 1.47    Other results:     Imaging   No results found.   Medications:     Scheduled Medications: . amoxicillin-clavulanate  1 tablet Oral Q24H  . atorvastatin  40 mg Oral q1800  . budesonide  0.5 mg Inhalation BID  . busPIRone  5 mg Oral BID  . calcitRIOL  0.5 mcg Oral Q M,W,F-HD  . Chlorhexidine Gluconate Cloth  6 each Topical Tomorrow-1000  . darbepoetin (ARANESP) injection - DIALYSIS  100 mcg Intravenous Q Wed-HD  . docusate sodium  100 mg Oral Daily  . feeding supplement (PRO-STAT SUGAR FREE 64)  30  mL Oral BID  . ferric citrate  420 mg Oral TID WC  . gabapentin  300 mg Oral BID  . insulin aspart  0-15 Units Subcutaneous TID WC  . insulin aspart  0-5 Units Subcutaneous QHS  . insulin aspart  3 Units Subcutaneous TID WC  . levothyroxine  25 mcg Oral QAC breakfast  . lidocaine-EPINEPHrine  20 mL Intradermal Once  . multivitamin  1 tablet Oral QHS  . pantoprazole  40 mg Oral Daily  . polyethylene glycol  17 g Oral Daily  . sildenafil  40 mg Oral TID  . thrombin recombinant  5,000 Units Topical Once  . Warfarin - Pharmacist Dosing Inpatient   Does not apply q1800    Infusions: . sodium chloride 10 mL/hr at 11/27/17 0742  . sodium chloride    . sodium chloride    . ferric gluconate (FERRLECIT/NULECIT) IV    . heparin 1,250 Units/hr (12/01/17 0416)    PRN Medications: sodium chloride, sodium chloride, acetaminophen, albuterol, alteplase, ferric citrate, guaiFENesin, heparin, lidocaine (PF), lidocaine, lidocaine-prilocaine, ondansetron (ZOFRAN) IV, oxyCODONE, pentafluoroprop-tetrafluoroeth, sodium chloride, traMADol, traZODone   Patient Profile   Trevor [name] is a 72 y.o. male  history of CAD s/p CABG x 4 2010, OSA, AS with TAVR 2015, ESRD, DM2, paroxysmal atrial fibrillation, asthma, and ischemic cardiomyopathy with Medtronic ICD. 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 Post op period complicated by ARF leading to ESRD and need for chronic dialysis.   Admitted 11/18/17 for heparin bridging for Excision of SCC x 2.   Assessment/Plan:    1. 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  Margins not clear at neck. 1/23 Evacuation of hematoma.  JP drain with air leak => removed 1/26. Erythema at right leg surgical site => from moisture/edema versus cellulitis.  Afebrile, WBCs lower today/not elevated.  - On heparin + coumadin, INR 1.47.  - Further steps in terms of neck site cancer per surgery.   - Surgery following right leg erythema, currently on Augmentin.  Will need to  be re-evaluated today by surgery with increased pain and concern from nursing.  2. Chronic systolic CHF: Ischemic cardiomyopathy, EF 20-25% with RV dysfunction on 2/18 echo pre-LVAD. 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 Medtronic ICD. Volume status per HD. Plan for HD again today.  - Continue sildenafil 40 TID for RV failure - warfarin goal 2-2.5, INR 1.47 today.  Will remain on heparin gtt until > 1.8.   3. CAD: s/p CABG.  No evidence for  ischemia.     4. ESRD: HD needs to be done at bedside with VAD trained personnel present. Nephrology following.   5. Atrial fibrillation: Chronic. 6. RV failure: Continue Revatio as above.  7. HTN:MAP stable.  8. Anemia: Baseline anemia of renal disease/chronic disease but recent bleeding from colonic AVMs. Hgb down to 7.1 with surgical site bleeding. Received 2UPRBCs 1/24.  Hgb 9 today. No overt bleeding. 9. 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  Off ASA. INR goal 2-2.5.  - Continue octreotide as outpatient.  10. H/o TAVR:  Stable on most recent echo.  11. Hemoptysis: ?Bleeding site.  No obvious mouth lesion.  [name] has evaluated neck surgical site, unlikely to be from there.  He says he coughs up the blood.  CT chest did not show lesion that would cause hemoptysis. GI has seen, do not think GI source.  He feels dripping in the back of his throat, possibly blood is coming from sinuses/upper airways.  Seems to have resolved. Continue nasal spray.   Length of Stay: 67  Loralie Champagne, MD 12/01/2017, 9:12 AM  VAD Team --- VAD ISSUES ONLY--- Pager [phone] (7am - 7am)  Advanced Heart Failure Team  Pager [phone] (M-F; 7a - 4p)  Please contact [facility] for night-coverage after hours (4p -7a ) and weekends on [email or web address]

## 2017-12-01 NOTE — Progress Notes (Signed)
[place] for IV Heparin Indication: LVAD  No Active Allergies  Patient Measurements: Height: 5\' 5"  (165.1 cm) Weight: 188 lb 0.8 oz (85.3 kg) IBW/kg (Calculated) : 61.5 Heparin Dosing Weight: 78 kg  Vital Signs: Temp: 98.2 F (36.8 C) (01/28 0340) Temp Source: Oral (01/28 0340) BP: 96/76 (01/28 0340) Pulse Rate: 77 (01/28 0340)  Labs: Recent Labs    11/29/17 0711 11/30/17 0409 12/01/17 0453  HGB 9.6* 8.9* 9.0*  HCT 29.8* 28.2* 28.2*  PLT 172 169 162  LABPROT 17.6* 18.4* 17.7*  INR 1.46 1.55 1.47  HEPARINUNFRC 0.27* 0.19* 0.16*  CREATININE 4.02* 3.55* 4.50*    Assessment: 72 yo male with LVAD, admitted for IV heparin bridge before dermatologic surgery 1/17.  PTA Coumadin dose 7.5 mg daily, last dose taken 1/13 per patient report. Pt is now s/p excision of left neck and right leg > with neck hematoma 1/21> drained in room by plastic surgeon - still oozing in pm so held warfarin and wound repacked> improved 1/22> dressing again changed and evacuated hematoma by plastic surgeon > repacked again 1/22 pm Warfarin held again and pt back to OR 1/23 with oozing sites cauterized.  **PTA warfarin dose = 7.5mg  daily  Warfarin restarted 1/23 - planning to keep INR on lower end Heparin resumed 1/24 - goal heparin level low  HL low this morning, down to 0.16, cbc wnl.   Goal of Therapy:  HL 0.2- 0.25ish INR 2-2.5 Monitor platelets by anticoagulation protocol: Yes   Plan:  -Increase heparin to 1350 units/hr -Check confirmatory level at 1400 -Daily HL, INR, CBC    Hughes Better, PharmD, BCPS Clinical Pharmacist 12/01/2017 6:08 AM

## 2017-12-01 NOTE — Progress Notes (Signed)
5 Days Post-Op   Subjective/Chief Complaint: Patient reports worsening of right lower leg pain, swelling and redness over the weekend.  Right lower anterior leg wound with Acell in place over wound bed. Scant serous appearing drainage.No odor erythema and swelling over the peri wound which has worsened.  WBC slightly up 11.4 Left neck without hematoma or seroma, scant drainage, no odor. Drain removed over weekend.  Will ask pharmacy to see for Vancomycin and Zosyn.    Objective: Vital signs in last 24 hours: Temp:  [97.8 F (36.6 C)-98.5 F (36.9 C)] 97.8 F (36.6 C) (01/28 1246) Pulse Rate:  [60-83] 83 (01/28 1246) Resp:  [13-20] 18 (01/28 1230) BP: (62-126)/(31-88) 92/60 (01/28 1246) SpO2:  [93 %-98 %] 98 % (01/28 1246) Weight:  [82.8 kg (182 lb 8.7 oz)-85.3 kg (188 lb 0.8 oz)] 82.8 kg (182 lb 8.7 oz) (01/28 1246) Last BM Date: 11/29/17  Intake/Output from previous day: 01/27 0701 - 01/28 0700 In: 830.8 [P.O.:540; I.V.:290.8] Out: -  Intake/Output this shift: Total I/O In: -  Out: 2495 [Other:2495]    Lab Results:  Recent Labs    11/30/17 0409 12/01/17 0453  WBC 10.3 11.4*  HGB 8.9* 9.0*  HCT 28.2* 28.2*  PLT 169 162   BMET Recent Labs    11/30/17 0409 12/01/17 0453  NA 131* 132*  K 4.3 4.5  CL 94* 95*  CO2 26 26  GLUCOSE 191* 164*  BUN 20 35*  CREATININE 3.55* 4.50*  CALCIUM 8.2* 8.5*   PT/INR Recent Labs    11/30/17 0409 12/01/17 0453  LABPROT 18.4* 17.7*  INR 1.55 1.47   ABG No results for input(s): PHART, HCO3 in the last 72 hours.  Invalid input(s): PCO2, PO2  Studies/Results: No results found.  Anti-infectives: Anti-infectives (From admission, onward)   Start     Dose/Rate Route Frequency Ordered Stop   11/25/17 1000  amoxicillin-clavulanate (AUGMENTIN) 875-125 MG per tablet 1 tablet  Status:  Discontinued     1 tablet Oral Every 12 hours 11/25/17 0821 11/25/17 0824   11/25/17 1000  amoxicillin-clavulanate (AUGMENTIN) 500-125 MG  per tablet 500 mg  Status:  Discontinued     1 tablet Oral Every 12 hours 11/25/17 0824 11/25/17 0825   11/25/17 1000  amoxicillin-clavulanate (AUGMENTIN) 500-125 MG per tablet 500 mg     1 tablet Oral Every 24 hours 11/25/17 0825 12/01/17 1325   11/20/17 0800  ceFAZolin (ANCEF) IVPB 2g/100 mL premix  Status:  Discontinued     2 g 200 mL/hr over 30 Minutes Intravenous To [facility]Stay [facility] 11/19/17 2312 11/20/17 1054   11/20/17 0600  ceFAZolin (ANCEF) IVPB 2g/100 mL premix  Status:  Discontinued     2 g 200 mL/hr over 30 Minutes Intravenous On call to O.R. 11/19/17 2312 11/19/17 2327      Assessment/Plan: s/p Procedure(s): EXPLORATION AND EXCISION OF LEFT NECK (Left) Right lower leg cellulitis  Will ask pharmacy to see for Zosyn and Vancomycin   LOS: 13 days    Andrew Blasius 12/01/2017

## 2017-12-02 DIAGNOSIS — Z7901 Long term (current) use of anticoagulants: Secondary | ICD-10-CM

## 2017-12-02 DIAGNOSIS — Z85828 Personal history of other malignant neoplasm of skin: Secondary | ICD-10-CM

## 2017-12-02 DIAGNOSIS — Z8249 Family history of ischemic heart disease and other diseases of the circulatory system: Secondary | ICD-10-CM

## 2017-12-02 DIAGNOSIS — M79604 Pain in right leg: Secondary | ICD-10-CM

## 2017-12-02 DIAGNOSIS — Z833 Family history of diabetes mellitus: Secondary | ICD-10-CM

## 2017-12-02 DIAGNOSIS — Z992 Dependence on renal dialysis: Secondary | ICD-10-CM

## 2017-12-02 DIAGNOSIS — L409 Psoriasis, unspecified: Secondary | ICD-10-CM

## 2017-12-02 DIAGNOSIS — E1122 Type 2 diabetes mellitus with diabetic chronic kidney disease: Secondary | ICD-10-CM

## 2017-12-02 LAB — RENAL FUNCTION PANEL
ALBUMIN: 3.3 g/dL — AB (ref 3.5–5.0)
ANION GAP: 10 (ref 5–15)
BUN: 22 mg/dL — AB (ref 6–20)
CALCIUM: 8.4 mg/dL — AB (ref 8.9–10.3)
CO2: 26 mmol/L (ref 22–32)
CREATININE: 3.45 mg/dL — AB (ref 0.61–1.24)
Chloride: 94 mmol/L — ABNORMAL LOW (ref 101–111)
GFR calc Af Amer: 19 mL/min — ABNORMAL LOW (ref >60)
GFR calc non Af Amer: 16 mL/min — ABNORMAL LOW (ref >60)
GLUCOSE: 175 mg/dL — AB (ref 65–99)
PHOSPHORUS: 2.2 mg/dL — AB (ref 2.5–4.6)
Potassium: 4.3 mmol/L (ref 3.5–5.1)
SODIUM: 130 mmol/L — AB (ref 135–145)

## 2017-12-02 LAB — GLUCOSE, CAPILLARY
GLUCOSE-CAPILLARY: 123 mg/dL — AB (ref 65–99)
GLUCOSE-CAPILLARY: 84 mg/dL (ref 65–99)
Glucose-Capillary: 147 mg/dL — ABNORMAL HIGH (ref 65–99)
Glucose-Capillary: 88 mg/dL (ref 65–99)

## 2017-12-02 LAB — CBC
HCT: 28.1 % — ABNORMAL LOW (ref 39.0–52.0)
HEMOGLOBIN: 9 g/dL — AB (ref 13.0–17.0)
MCH: 31.1 pg (ref 26.0–34.0)
MCHC: 32 g/dL (ref 30.0–36.0)
MCV: 97.2 fL (ref 78.0–100.0)
PLATELETS: 186 10*3/uL (ref 150–400)
RBC: 2.89 MIL/uL — ABNORMAL LOW (ref 4.22–5.81)
RDW: 19.3 % — AB (ref 11.5–15.5)
WBC: 11.5 10*3/uL — ABNORMAL HIGH (ref 4.0–10.5)

## 2017-12-02 LAB — HEPARIN LEVEL (UNFRACTIONATED)
HEPARIN UNFRACTIONATED: 0.25 [IU]/mL — AB (ref 0.30–0.70)
Heparin Unfractionated: 0.27 IU/mL — ABNORMAL LOW (ref 0.30–0.70)

## 2017-12-02 LAB — PROTIME-INR
INR: 1.62
PROTHROMBIN TIME: 19.1 s — AB (ref 11.4–15.2)

## 2017-12-02 LAB — LACTATE DEHYDROGENASE: LDH: 271 U/L — AB (ref 98–192)

## 2017-12-02 MED ORDER — VANCOMYCIN HCL IN DEXTROSE 1-5 GM/200ML-% IV SOLN: 1000.0000 mg | INTRAVENOUS | Status: DC

## 2017-12-02 MED ORDER — WARFARIN SODIUM 10 MG PO TABS
10.0000 mg | ORAL_TABLET | Freq: Once | ORAL | Status: AC
  Administered 2017-12-02: 10 mg via ORAL
  Filled 2017-12-02: qty 1

## 2017-12-02 MED ORDER — FERRIC CITRATE 1 GM 210 MG(FE) PO TABS
210.0000 mg | ORAL_TABLET | Freq: Three times a day (TID) | ORAL | Status: DC
  Administered 2017-12-02 – 2017-12-03 (×4): 210 mg via ORAL
  Filled 2017-12-02 (×6): qty 1

## 2017-12-02 NOTE — Progress Notes (Signed)
Patient ID: Trevor [name], male   DOB: 1945/11/19, 72 y.o.   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   Path shows extension of squamous cell CA to margins at neck site.   1/23 Returned for evacuation hematoma at neck surgery site.  1/24 2UPRBCs  Hgb 7.1 12/01/17 Started on IV ABX for RLE cellulitis.   Feeling OK. Denies SOB, CP, lightheadedness or dizziness. Pain well controlled. [name] saw this morning to re-dress leg. Plans to involve ID.   INR 1.62. Remains on IV heparin.  Hgb 9.0   LVAD INTERROGATION:  HeartMate II LVAD:  Flow 5.2 liters/min, speed 9600, power 6.0, PI 4.2.  Numerous PI events yesterday with HD.   Objective:    Vital Signs:   Temp:  [97.8 F (36.6 C)-99 F (37.2 C)] 98.5 F (36.9 C) (01/29 0402) Pulse Rate:  [62-83] 82 (01/29 0740) Resp:  [11-20] 14 (01/29 0740) BP: (62-126)/(31-89) 107/79 (01/29 0740) SpO2:  [93 %-99 %] 99 % (01/29 0740) Weight:  [182 lb 8.7 oz (82.8 kg)-188 lb 0.8 oz (85.3 kg)] 182 lb 8.7 oz (82.8 kg) (01/28 1246) Last BM Date: 11/29/17 Mean arterial Pressure 88  Intake/Output:   Intake/Output Summary (Last 24 hours) at 12/02/2017 0742 Last data filed at 12/02/2017 0402 Gross per 24 hour  Intake 822.08 ml  Output 2495 ml  Net -1672.92 ml     Physical Exam    Physical Exam: GENERAL: Well appearing this am. NAD.  HEENT: Normal. NECK: Supple, JVP 7-8 cm. Carotids OK.  CARDIAC:  Mechanical heart sounds with LVAD hum present.  LUNGS:  CTAB, normal effort.  ABDOMEN:  NT, ND, no HSM. No bruits or masses. +BS  LVAD exit site: Well-healed and incorporated. Dressing dry and intact. No erythema or drainage. Stabilization device present and accurately applied. Driveline dressing changed daily per sterile technique. EXTREMITIES:  Warm and dry. No cyanosis, clubbing, rash, or edema.  NEUROLOGIC:  Alert & oriented x 3. Cranial  nerves grossly intact. Moves all 4 extremities w/o difficulty. Affect pleasant    SKIN: Left neck surgical site benign. RLE lower leg site dressed and wrapped.   Telemetry   Afib 70s, personally reviewed.   Labs   Basic Metabolic Panel: Recent Labs  Lab 11/28/17 0120 11/29/17 0711 11/30/17 0409 12/01/17 0453 12/02/17 0204  NA 129* 131* 131* 132* 130*  K 4.3 4.0 4.3 4.5 4.3  CL 94* 95* 94* 95* 94*  CO2 25 25 26 26 26   GLUCOSE 137* 155* 191* 164* 175*  BUN 44* 23* 20 35* 22*  CREATININE 5.65* 4.02* 3.55* 4.50* 3.45*  CALCIUM 8.3* 8.4* 8.2* 8.5* 8.4*  PHOS 3.6 2.4* 1.7* 2.5 2.2*    Liver Function Tests: Recent Labs  Lab 11/28/17 0120 11/29/17 0711 11/30/17 0409 12/01/17 0453 12/02/17 0204  ALBUMIN 3.1* 3.4* 3.1* 3.3* 3.3*   No results for input(s): LIPASE, AMYLASE in the last 168 hours. No results for input(s): AMMONIA in the last 168 hours.  CBC: Recent Labs  Lab 11/26/17 0254 11/27/17 0257 11/28/17 0120 11/29/17 0711 11/30/17 0409 12/01/17 0453 12/02/17 0204  WBC 9.9 11.6* 11.5* 12.1* 10.3 11.4* 11.5*  NEUTROABS 7.2 9.1* 8.2* 8.6*  --   --   --   HGB 8.2* 7.1* 8.8* 9.6* 8.9* 9.0* 9.0*  HCT 25.9* 22.0* 26.4* 29.8* 28.2* 28.2* 28.1*  MCV 94.9 95.7  92.6 93.4 94.6 95.3 97.2  PLT 144* 149* 141* 172 169 162 186    INR: Recent Labs  Lab 11/28/17 0048 11/29/17 0711 11/30/17 0409 12/01/17 0453 12/02/17 0204  INR 1.32 1.46 1.55 1.47 1.62    Other results:     Imaging   No results found.   Medications:     Scheduled Medications: . atorvastatin  40 mg Oral q1800  . budesonide  0.5 mg Inhalation BID  . busPIRone  5 mg Oral BID  . calcitRIOL  0.5 mcg Oral Q M,W,F-HD  . Chlorhexidine Gluconate Cloth  6 each Topical Tomorrow-1000  . [START ON 12/03/2017] darbepoetin (ARANESP) injection - DIALYSIS  200 mcg Intravenous Q Wed-HD  . docusate sodium  100 mg Oral Daily  . feeding supplement (PRO-STAT SUGAR FREE 64)  30 mL Oral BID  . ferric citrate   420 mg Oral TID WC  . gabapentin  300 mg Oral BID  . insulin aspart  0-15 Units Subcutaneous TID WC  . insulin aspart  0-5 Units Subcutaneous QHS  . insulin aspart  3 Units Subcutaneous TID WC  . levothyroxine  25 mcg Oral QAC breakfast  . lidocaine-EPINEPHrine  20 mL Intradermal Once  . multivitamin  1 tablet Oral QHS  . pantoprazole  40 mg Oral Daily  . polyethylene glycol  17 g Oral Daily  . sildenafil  40 mg Oral TID  . thrombin recombinant  5,000 Units Topical Once  . Warfarin - Pharmacist Dosing Inpatient   Does not apply q1800    Infusions: . sodium chloride 10 mL/hr at 11/27/17 0742  . sodium chloride    . sodium chloride    . ferric gluconate (FERRLECIT/NULECIT) IV Stopped (12/01/17 1246)  . heparin 1,350 Units/hr (12/02/17 0402)  . piperacillin-tazobactam (ZOSYN)  IV 3.375 g (12/02/17 0426)    PRN Medications: sodium chloride, sodium chloride, acetaminophen, albuterol, alteplase, ferric citrate, guaiFENesin, heparin, lidocaine (PF), lidocaine, lidocaine-prilocaine, ondansetron (ZOFRAN) IV, oxyCODONE, pentafluoroprop-tetrafluoroeth, sodium chloride, traMADol, traZODone   Patient Profile   DEHAVEN SINE is a 72 y.o. male  history of CAD s/p CABG x 4 2010, OSA, AS with TAVR 2015, ESRD, DM2, paroxysmal atrial fibrillation, asthma, and ischemic cardiomyopathy with Medtronic ICD. 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 Post op period complicated by ARF leading to ESRD and need for chronic dialysis.   Admitted 11/18/17 for heparin bridging for Excision of SCC x 2.   Assessment/Plan:    1. 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  Margins not clear at neck. 1/23 Evacuation of hematoma.  JP drain with air leak => removed 1/26. Erythema at right leg surgical site => from moisture/edema versus cellulitis.  - Afebrile. WBCs 11.5 with cellulitis.  - On heparin + coumadin, INR 1.62 - Further steps in terms of neck site cancer per surgery.   - ABX expanded to Vanc/Zosyn  for RLE cellulitis. Per patient [name] to involve ID.  2. Chronic systolic CHF: Ischemic cardiomyopathy, EF 20-25% with RV dysfunction on 2/18 echo pre-LVAD. 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 Medtronic ICD.  - Volume status per HD. Tolerated HD yesterday, plan for tomorrow.  - Continue sildenafil 40 TID for RV failure - warfarin goal 2-2.5, INR 1.62 today. Will remain on heparin gtt until > 1.8.   3. CAD: s/p CABG.   - No s/s of ischemia.    4. ESRD: HD needs to be done at bedside with VAD trained personnel present. Nephrology following.  No  change.  5. Atrial fibrillation: Chronic. Rate controlled.  6. RV failure: Continue Revatio as above. No change.  7. HTN - MAPs stable.  8. Anemia: Baseline anemia of renal disease/chronic disease but recent bleeding from colonic AVMs. Hgb down to 7.1 with surgical site bleeding. Received 2UPRBCs 1/24.   - Hgb 9.0 today. No bleeding.  9. 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  Off ASA. INR goal 2-2.5.  - Continue octreotide as outpatient. No change.  10. H/o TAVR:  Stable on most recent echo. No change. 11. Hemoptysis: ?Bleeding site.  No obvious mouth lesion.  [name] has evaluated neck surgical site, unlikely to be from there.  He says he coughs up the blood.  CT chest did not show lesion that would cause hemoptysis. GI has seen, do not think GI source.  He feels dripping in the back of his throat, possibly blood is coming from sinuses/upper airways.   - No further. Continue nasal spray.   Length of Stay: [address]  Annamaria Helling 12/02/2017, 7:42 AM  VAD Team --- VAD ISSUES ONLY--- Pager [phone] (7am - 7am)  Advanced Heart Failure Team  Pager [phone] (M-F; 7a - 4p)  Please contact [facility] for night-coverage after hours (4p -7a ) and weekends on [email or web address]  Patient seen with PA, agree with the above note.   Now treating with Vancomycin/Zosyn for right lower leg surgical site  cellulitis.  Afebrile, mild elevation in WBCs.  Will ask ID for assistance regarding treatment regimen.   Hemoglobin stable at 9 today. INR 1.6, continue IV heparin until INR > 1.8.   Had HD yesterday with no problems.   LVAD parameters reviewed and stable.   Loralie Champagne 12/02/2017 8:10 AM

## 2017-12-02 NOTE — Progress Notes (Signed)
ANTICOAGULATION CONSULT NOTE  Pharmacy Consult for IV Heparin Indication: LVAD  No Active Allergies  Patient Measurements: Height: 5\' 5"  (165.1 cm) Weight: 187 lb (84.8 kg) IBW/kg (Calculated) : 61.5 Heparin Dosing Weight: 78 kg  Vital Signs: Temp: 97.4 F (36.3 C) (01/29 1707) Temp Source: Axillary (01/29 1707) BP: 108/93 (01/29 1657) Pulse Rate: 67 (01/29 1158)  Labs: Recent Labs    11/30/17 0409 12/01/17 0453  12/01/17 2221 12/02/17 0204 12/02/17 0728 12/02/17 1654  HGB 8.9* 9.0*  --   --  9.0*  --   --   HCT 28.2* 28.2*  --   --  28.1*  --   --   PLT 169 162  --   --  186  --   --   LABPROT 18.4* 17.7*  --   --  19.1*  --   --   INR 1.55 1.47  --   --  1.62  --   --   HEPARINUNFRC 0.19* 0.16*   < > 0.16*  --  0.27* 0.25*  CREATININE 3.55* 4.50*  --   --  3.45*  --   --    < > = values in this interval not displayed.    Assessment: 72 yo male with LVAD, admitted for IV heparin bridge before dermatologic surgery 1/17.  PTA Coumadin dose 7.5 mg daily, last dose taken 1/13 per patient report. Pt is now s/p excision of left neck and right leg > with neck hematoma 1/21> drained in room by plastic surgeon - still oozing in pm so held warfarin and wound repacked> improved. 1/22> dressing again changed and evacuated hematoma by plastic surgeon > repacked again 1/22 pm Warfarin held again and pt back to OR 1/23 with oozing sites cauterized.  **PTA warfarin dose = 7.5mg  daily  Warfarin restarted 1/23 - planning to keep INR on lower end Heparin resumed 1/24 - goal heparin level low  HL 0.25   Goal of Therapy:  HL 0.2- 0.25ish INR 2-2.5 Monitor platelets by anticoagulation protocol: Yes   Plan:  Continue heparin 1300 units/hr Daily HL, CBC  Levester Fresh, PharmD, BCPS, BCCCP Clinical Pharmacist Clinical phone for 12/02/2017 from 1430 - 2300: [phone] If after 2300, please call main pharmacy at: [phone] 12/02/2017 5:55 PM

## 2017-12-02 NOTE — Consult Note (Signed)
[facility] for Infectious Disease    Date of Admission:  11/18/2017     Total days of antibiotics 8              Reason for Consult: Cellulitis ?    Referring Provider: Aundra Dubin   Assessment: 72 y.o. male with ESRD on dialysis via R AVF, CHF s/p HM2 LVAD placed 12/2016 that is now post-op squamous cell carcinoma excision of right anterior shin and left neck. These sites were both positive on biopsy for SSC and previously were growing rapidly in size and ulcerated. He has had worsening pain and redness to the right anterior shin concerning for cellulitis. He has pain with light touch distal to the open site however no swelling or warmth. He has occasional temps of 99 but otherwise afebrile since starting amox-clav (although he has been getting pretty consistent tylenol dosing for pain). WBC stable 10-11.5. Amox-clav excellent choice to cover for typical strep offending pathogen although no improvement. Although not overtly purulent will choose agent to provide coverage against staph including MRSA. He is on too many SSRI's to consider linezolid. Likely he will have prolonged wound healing of this site considering his venous stasis and altered blood flow 2/2 LVAD.   Plan: 1. Stop zosyn and vancomycin.  2. Will anticipate starting Doxycycline tomorrow to offer more staph coverage as he has been afebrile and stable otherwise. Likely discharge home soon per his account/wishes.  3. Continue with light compression and elevation of extremity.  4. If he has no improvement on appropriate antibiotic therapy he may need biopsy of site to help target therapy or determine more about etiology of wound.   Principal Problem:   Presence of left ventricular assist device (LVAD) (HCC) Active Problems:   ESRD (end stage renal disease) ([place])   Chronic systolic heart failure (HCC)   Skin cancer   . atorvastatin  40 mg Oral q1800  . budesonide  0.5 mg Inhalation BID  . busPIRone  5 mg Oral BID    . calcitRIOL  0.5 mcg Oral Q M,W,F-HD  . Chlorhexidine Gluconate Cloth  6 each Topical Tomorrow-1000  . [START ON 12/03/2017] darbepoetin (ARANESP) injection - DIALYSIS  200 mcg Intravenous Q Wed-HD  . docusate sodium  100 mg Oral Daily  . feeding supplement (PRO-STAT SUGAR FREE 64)  30 mL Oral BID  . ferric citrate  210 mg Oral TID WC  . gabapentin  300 mg Oral BID  . insulin aspart  0-15 Units Subcutaneous TID WC  . insulin aspart  0-5 Units Subcutaneous QHS  . insulin aspart  3 Units Subcutaneous TID WC  . levothyroxine  25 mcg Oral QAC breakfast  . lidocaine-EPINEPHrine  20 mL Intradermal Once  . multivitamin  1 tablet Oral QHS  . pantoprazole  40 mg Oral Daily  . polyethylene glycol  17 g Oral Daily  . sildenafil  40 mg Oral TID  . thrombin recombinant  5,000 Units Topical Once  . warfarin  10 mg Oral ONCE-1800  . Warfarin - Pharmacist Dosing Inpatient   Does not apply q1800    HPI: Trevor [name] is a 72 y.o. male with CHF s/p HM2 LVAD 12/2016, diabetes, chronic anticoagulation on warfarin, ESRD on dialysis  S/P excision of left neck and right leg squamous cell carcinoma 11/20/17. Complicated by large hematoma/bleeding to neck site that required another surgery on 11/26/17. He has over the last 6 days had progressively worsening pain  and redness to his shin. Describes it to be a 'throbbing' pain. He has been walking a lot since admission as he is a bit bored. He has since been applying light compression with ACE wrap and elevating his leg which has helped. He still also has pain to left neck/chest where he sustained significant hematoma after surgery r/t chronic anticoagulation. No fevers, chills, altered appetite, malaise. Tolerating antibiotics well without rash or diarrhea. He has a few areas of psoriasis that are itching to chest and knees.   Review of Systems: Review of Systems  Constitutional: Negative for chills and fever.  HENT: Negative for tinnitus.   Eyes: Negative for  blurred vision and photophobia.  Respiratory: Negative for cough and sputum production.   Cardiovascular: Negative for chest pain.  Gastrointestinal: Negative for abdominal pain, diarrhea, nausea and vomiting.  Genitourinary: Negative for dysuria.  Musculoskeletal: Negative for myalgias.  Skin: Positive for itching and rash.  Neurological: Negative for headaches.    Past Medical History:  Diagnosis Date  . AICD (automatic cardioverter/defibrillator) present   . Asthma   . AVM (arteriovenous malformation) of colon 07/07/2017  . CHF (congestive heart failure) ([place])   . Diabetes ([place])   . ESRF (end stage renal failure) ([place]) 07/07/2017  . Kidney disease   . Permanent atrial fibrillation ([place]) 07/07/2017  . Presence of permanent cardiac pacemaker    AICD  . Sleep apnea     Social History   Tobacco Use  . Smoking status: Never Smoker  . Smokeless tobacco: Never Used  Substance Use Topics  . Alcohol use: No  . Drug use: No    Family History  Problem Relation Age of Onset  . Heart failure Father   . Heart attack Father   . Healthy Mother   . Diabetes Paternal Grandfather    No Active Allergies  OBJECTIVE: Blood pressure 107/79, pulse 82, temperature 98.5 F (36.9 C), temperature source Oral, resp. rate 14, height 5\' 5"  (1.651 m), weight 182 lb 8.7 oz (82.8 kg), SpO2 99 %.  Physical Exam  Constitutional: He is oriented to person, place, and time and well-developed, well-nourished, and in no distress.  HENT:  Mouth/Throat: No oral lesions. Normal dentition. No dental caries.  Eyes: No scleral icterus.  Cardiovascular: Normal rate and regular rhythm.  LVAD hum present; no native heart tones heard  Pulmonary/Chest: Effort normal and breath sounds normal.  Abdominal: Soft. He exhibits no distension. There is no tenderness.  Musculoskeletal: He exhibits no edema.  Lymphadenopathy:    He has no cervical adenopathy.  Neurological: He is alert and oriented to person, place, and  time.  Skin: Skin is warm and dry. No rash noted.  Patches of psoriasis over abdomen and right knee   Psychiatric: Mood and affect normal.    RLE anterior surgical wound - s/p Acell application. Serosanguinous drainage to dressing without odor. Erythema and tenderness with light touch to skin about 2 inches from open wound towards feet. Not warm. Minimal to no swelling noted.        Lab Results Lab Results  Component Value Date   WBC 11.5 (H) 12/02/2017   HGB 9.0 (L) 12/02/2017   HCT 28.1 (L) 12/02/2017   MCV 97.2 12/02/2017   PLT 186 12/02/2017    Lab Results  Component Value Date   CREATININE 3.45 (H) 12/02/2017   BUN 22 (H) 12/02/2017   NA 130 (L) 12/02/2017   K 4.3 12/02/2017   CL 94 (L) 12/02/2017  CO2 26 12/02/2017    Lab Results  Component Value Date   ALT 20 11/18/2017   AST 31 11/18/2017   ALKPHOS 81 11/18/2017   BILITOT 1.0 11/18/2017     Microbiology: No results found for this or any previous visit (from the past 240 hour(s)).  [name], MSN, NP-C [facility] for Infectious [place] Cell: [phone] Pager: [phone]  12/02/2017 10:46 AM

## 2017-12-02 NOTE — Progress Notes (Signed)
LVAD Coordinator Rounding Note:  Admitted1/15/2019 for scheduled inpatient procedure/heparin bridging.S/P excision of squamous cell CA x2 for neck and right leg on 11/20/2017.  Evacuation of neck hematoma 11/26/17  HM II LVAD implanted on 12/13/16 by Dr. Darcey Nora under Destination Therapy criteria.  Pt states that ID and Oncology was consulted today. Pt is in good spirits today.   Vital signs: Temp: 98.2 HR:  82 Doppler Pressure:not done Automatic BP: 107/79 (90) O2 Sat:  99 % on RA Wt in lbs:179>177>182>187>183>183>190>190>184>188>188>187 lbs  LVAD interrogation reveals:  Speed: 9600 Flow:5.5 Power: 6.3w PI:3.1 Alarms: one low voltage advisory Events:6 PI events Fixed speed: 9600 Low speed limit: 9000  Drive Line: Patient uses weekly kits. Next due 12/04/17. - Dressing dry and intact; anchor intact and accurately applied.   Infection: Augmentin started 11/25/17 for neck wound Vanc/Zosyn started 12/01/17 for leg cellulitis d/c 12/02/17 by ID Doxycyline started 12/02/17 by ID  Labs:  INR trend:1.90>1.89>1.42>1.33>2.34>1.81>1.4>1.33>1.32>1.46>1.55>1.47>1.62  LDH trend: 290>264>239>240>268>261>235>250>256>234>215>233>264>259>274>271  WBC: 11.5  Anticoagulation Plan: - INR Goal: 2.0 - 2.5 - Coumadin stopped 11/26/17 - Heparin gtt started 11/26/2017 - managed per PharmD  Device: - Medtronic single lead  -Therapies: on - last check: 11/26/2017  Plan/Recommendations:   1.Please page VAD coordinator for equipment issues or patient concerns 2. VAD weekly dressing change - due 12/04/17.  [name], VAD Coordinator 24/7 pager [phone]

## 2017-12-02 NOTE — Progress Notes (Signed)
Observed pt walking independently. No apparent CR needs. Will not follow. Yves Dill CES, ACSM 7:35 AM 12/02/2017

## 2017-12-02 NOTE — Progress Notes (Signed)
[facility] for IV Heparin Indication: LVAD  No Active Allergies  Patient Measurements: Height: 5\' 5"  (165.1 cm) Weight: 182 lb 8.7 oz (82.8 kg) IBW/kg (Calculated) : 61.5 Heparin Dosing Weight: 78 kg  Vital Signs: Temp: 98.5 F (36.9 C) (01/29 0402) Temp Source: Oral (01/29 0402) BP: 107/79 (01/29 0740) Pulse Rate: 82 (01/29 0740)  Labs: Recent Labs    11/30/17 0409 12/01/17 0453 12/01/17 1247 12/01/17 2221 12/02/17 0204 12/02/17 0728  HGB 8.9* 9.0*  --   --  9.0*  --   HCT 28.2* 28.2*  --   --  28.1*  --   PLT 169 162  --   --  186  --   LABPROT 18.4* 17.7*  --   --  19.1*  --   INR 1.55 1.47  --   --  1.62  --   HEPARINUNFRC 0.19* 0.16* 0.70 0.16*  --  0.27*  CREATININE 3.55* 4.50*  --   --  3.45*  --     Assessment: 72 yo male with LVAD, admitted for IV heparin bridge before dermatologic surgery 1/17.  PTA Coumadin dose 7.5 mg daily, last dose taken 1/13 per patient report. Pt is now s/p excision of left neck and right leg > with neck hematoma 1/21> drained in room by plastic surgeon - still oozing in pm so held warfarin and wound repacked> improved. 1/22> dressing again changed and evacuated hematoma by plastic surgeon > repacked again 1/22 pm Warfarin held again and pt back to OR 1/23 with oozing sites cauterized.  **PTA warfarin dose = 7.5mg  daily  Warfarin restarted 1/23 - planning to keep INR on lower end Heparin resumed 1/24 - goal heparin level low  Heparin level this morning is 0.27, on 1350 units/hr, slightly above goal range ~0.2. Hgb is 9, plt is stable. Heparin running in R forearm and level drawn from hand below IV. INR increased from 1.47 to 1.62, after 10 mg dose last night. No infusion issues or s/sx of bleeding.  Goal of Therapy:  HL 0.2- 0.25ish INR 2-2.5 Monitor platelets by anticoagulation protocol: Yes   Plan:  -Decrease heparin to 1300 units/hr -Order warfarin 10 mg x1 for another night -Check level  in 8 hours -Daily HL, CBC  Doylene Canard, PharmD Clinical Pharmacist  Pager: [phone] Clinical Phone for 12/02/2016 until 3:30pm: [phone] If after 3:30pm, please call main pharmacy at [phone] 12/02/2017 9:15 AM

## 2017-12-02 NOTE — Progress Notes (Signed)
[name] Progress Note   Subjective:     Unable to cannulate AVF yesterday Had HD via [facility] [name] notes pt had numerous PI events during HD yesterday Started on ATB's yesterday for RLE cellulitus (vancomycin and zosyn)  Objective Vitals:   12/01/17 2343 12/02/17 0402 12/02/17 0719 12/02/17 0740  BP: 101/87 101/81  107/79  Pulse: 63 62  82  Resp: 13 11  14   Temp: 98.3 F (36.8 C) 98.5 F (36.9 C)    TempSrc: Oral Oral    SpO2: 95% 93% 96% 99%  Weight:      Height:       Physical Exam Seen in bed, NAD VS as noted Large scar left neck up to ear with dried blood Mechanical heart sounds/LVAD hum Lungs clear Abd soft and not tender R calf bandaged (not removed) LLE 1+ pitting edema L AVF patent. I removed all dressings - has hematoma but + bruit TDC dsg in place  Dialysis Orders: [facility] 4.5h    82kg   2K/2Ca   LU AVF/ TDC(using AVF, 2 needles 17ga PTA)   Hep 5800 Mircera 200 mcg IV q2wks, last on 11/05/17 venofer 50mg  IV qwk, last 11/12/17 Calcitriol 0.32mcg PO qHD TID  Last Labs:1/9/19Hgb 12.2, TSAT23%, K4.4, Ca8.9, P6.7, PTH245, Albumin4.2   Assessment:  1. SCCa L neck and RLE- sp excision by [name] on 1/17; evacuation of neck hematoma 1/23. ATB's started 1/28 for RLE cellulitus (V/Z) 2. ESRD- MWFS schedule. 4x /week for Orlin Hilding issues.  Using AVF 2 needles 17ga at center prior to admission -had post HD bleeding on Saturday, hematoma yesterday and unable to cannulate. Will use TDC next couple of treatments then re-attempt AVF.  today, so using TDC and ice to AVF. May need to rest for several days to allow hematoma to resolve. No heparin with HD for now as getting systemic. 3. Hypotension/vol - Post HD weight was 82.8. EDW 82. Try to get to EDW on Wednesday w/TMT and then see if needs lower.  4. Anemia- 2 unit PRBC 1/24 for hgb of 7.1. Aranesp 16mcg qwk 1/23 last dose. ^ to 200. No IV Fe now with ATB's. Hb stable at 9  right now. 5. Secondary Hyperparathyroidism -last phos 2.2. Reduce auryxia to 1ac. Continue calcitriol TIW 6. Nutrition- Ok on heart healthy diet - have added  fluid restriction; Renavite. Added prostat 7. 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 - on coumadin/heparin - per cardio/LVAD team  8. CAD s/p CABG - stable, per cardio 9. A fib - on coumadin - per cardio 10. H/o GIB- per primary 11. H/o TAVR -stable, per primary 12. DM - stable, per primary  Jamal Maes, MD [facility] [phone] Pager 12/01/2017, 11:10 AM   Additional Objective  Lab Results  Component Value Date   INR 1.62 12/02/2017   INR 1.47 12/01/2017   INR 1.55 62/37/6283    Basic Metabolic Panel: Recent Labs  Lab 11/30/17 0409 12/01/17 0453 12/02/17 0204  NA 131* 132* 130*  K 4.3 4.5 4.3  CL 94* 95* 94*  CO2 26 26 26   GLUCOSE 191* 164* 175*  BUN 20 35* 22*  CREATININE 3.55* 4.50* 3.45*  CALCIUM 8.2* 8.5* 8.4*  PHOS 1.7* 2.5 2.2*    Recent Labs  Lab 11/30/17 0409 12/01/17 0453 12/02/17 0204  ALBUMIN 3.1* 3.3* 3.3*   Recent Labs  Lab 11/27/17 0257 11/28/17 0120 11/29/17 0711 11/30/17 0409 12/01/17 0453 12/02/17 0204  WBC 11.6* 11.5* 12.1*  10.3 11.4* 11.5*  NEUTROABS 9.1* 8.2* 8.6*  --   --   --   HGB 7.1* 8.8* 9.6* 8.9* 9.0* 9.0*  HCT 22.0* 26.4* 29.8* 28.2* 28.2* 28.1*  MCV 95.7 92.6 93.4 94.6 95.3 97.2  PLT 149* 141* 172 169 162 186    Recent Labs  Lab 12/01/17 0823 12/01/17 1243 12/01/17 1745 12/01/17 2122 12/02/17 0809  GLUCAP 147* 124* 193* 144* 147*   Lab Results  Component Value Date   INR 1.62 12/02/2017   INR 1.47 12/01/2017   INR 1.55 11/30/2017   Medications: . sodium chloride 10 mL/hr at 11/27/17 0742  . sodium chloride    . sodium chloride    . ferric gluconate (FERRLECIT/NULECIT) IV Stopped (12/01/17 1246)  . heparin 1,350 Units/hr (12/02/17 0402)  . piperacillin-tazobactam (ZOSYN)  IV 3.375 g (12/02/17 0426)   . atorvastatin   40 mg Oral q1800  . budesonide  0.5 mg Inhalation BID  . busPIRone  5 mg Oral BID  . calcitRIOL  0.5 mcg Oral Q M,W,F-HD  . Chlorhexidine Gluconate Cloth  6 each Topical Tomorrow-1000  . [START ON 12/03/2017] darbepoetin (ARANESP) injection - DIALYSIS  200 mcg Intravenous Q Wed-HD  . docusate sodium  100 mg Oral Daily  . feeding supplement (PRO-STAT SUGAR FREE 64)  30 mL Oral BID  . ferric citrate  420 mg Oral TID WC  . gabapentin  300 mg Oral BID  . insulin aspart  0-15 Units Subcutaneous TID WC  . insulin aspart  0-5 Units Subcutaneous QHS  . insulin aspart  3 Units Subcutaneous TID WC  . levothyroxine  25 mcg Oral QAC breakfast  . lidocaine-EPINEPHrine  20 mL Intradermal Once  . multivitamin  1 tablet Oral QHS  . pantoprazole  40 mg Oral Daily  . polyethylene glycol  17 g Oral Daily  . sildenafil  40 mg Oral TID  . thrombin recombinant  5,000 Units Topical Once  . Warfarin - Pharmacist Dosing Inpatient   Does not apply [phone]

## 2017-12-03 DIAGNOSIS — L0390 Cellulitis, unspecified: Secondary | ICD-10-CM

## 2017-12-03 DIAGNOSIS — R001 Bradycardia, unspecified: Secondary | ICD-10-CM

## 2017-12-03 DIAGNOSIS — L03115 Cellulitis of right lower limb: Secondary | ICD-10-CM

## 2017-12-03 LAB — CBC
HEMATOCRIT: 27.6 % — AB (ref 39.0–52.0)
Hemoglobin: 9.1 g/dL — ABNORMAL LOW (ref 13.0–17.0)
MCH: 31.4 pg (ref 26.0–34.0)
MCHC: 33 g/dL (ref 30.0–36.0)
MCV: 95.2 fL (ref 78.0–100.0)
PLATELETS: 159 10*3/uL (ref 150–400)
RBC: 2.9 MIL/uL — ABNORMAL LOW (ref 4.22–5.81)
RDW: 18.8 % — AB (ref 11.5–15.5)
WBC: 11.4 10*3/uL — AB (ref 4.0–10.5)

## 2017-12-03 LAB — GLUCOSE, CAPILLARY
GLUCOSE-CAPILLARY: 146 mg/dL — AB (ref 65–99)
GLUCOSE-CAPILLARY: 191 mg/dL — AB (ref 65–99)
GLUCOSE-CAPILLARY: 112 mg/dL — AB (ref 65–99)

## 2017-12-03 LAB — RENAL FUNCTION PANEL
ALBUMIN: 3.2 g/dL — AB (ref 3.5–5.0)
Anion gap: 12 (ref 5–15)
BUN: 37 mg/dL — AB (ref 6–20)
CO2: 23 mmol/L (ref 22–32)
CREATININE: 4.71 mg/dL — AB (ref 0.61–1.24)
Calcium: 8.7 mg/dL — ABNORMAL LOW (ref 8.9–10.3)
Chloride: 96 mmol/L — ABNORMAL LOW (ref 101–111)
GFR calc Af Amer: 13 mL/min — ABNORMAL LOW (ref >60)
GFR, EST NON AFRICAN AMERICAN: 11 mL/min — AB (ref >60)
Glucose, Bld: 147 mg/dL — ABNORMAL HIGH (ref 65–99)
PHOSPHORUS: 3.4 mg/dL (ref 2.5–4.6)
POTASSIUM: 4.5 mmol/L (ref 3.5–5.1)
Sodium: 131 mmol/L — ABNORMAL LOW (ref 135–145)

## 2017-12-03 LAB — PROTIME-INR
INR: 2.08
Prothrombin Time: 23.2 seconds — ABNORMAL HIGH (ref 11.4–15.2)

## 2017-12-03 LAB — LACTATE DEHYDROGENASE: LDH: 263 U/L — ABNORMAL HIGH (ref 98–192)

## 2017-12-03 LAB — HEPARIN LEVEL (UNFRACTIONATED): HEPARIN UNFRACTIONATED: 0.24 [IU]/mL — AB (ref 0.30–0.70)

## 2017-12-03 MED ORDER — VANCOMYCIN HCL IN DEXTROSE 1-5 GM/200ML-% IV SOLN: 1000.0000 mg | INTRAVENOUS | 1 refills | Status: DC

## 2017-12-03 MED ORDER — WARFARIN SODIUM 5 MG PO TABS
5.0000 mg | ORAL_TABLET | Freq: Once | ORAL | Status: AC
  Administered 2017-12-03: 5 mg via ORAL
  Filled 2017-12-03: qty 1

## 2017-12-03 MED ORDER — VANCOMYCIN HCL IN DEXTROSE 1-5 GM/200ML-% IV SOLN: 1000.0000 mg | INTRAVENOUS | Status: DC

## 2017-12-03 MED ORDER — OXYCODONE HCL 5 MG PO TABS: 5.0000 mg | ORAL_TABLET | Freq: Three times a day (TID) | ORAL | 0 refills | Status: DC | PRN

## 2017-12-03 MED ORDER — WARFARIN SODIUM 5 MG PO TABS: ORAL_TABLET | ORAL | 6 refills | Status: DC

## 2017-12-03 MED ORDER — VANCOMYCIN HCL IN DEXTROSE 1-5 GM/200ML-% IV SOLN: 1000.0000 mg | INTRAVENOUS | 0 refills | Status: DC

## 2017-12-03 MED ORDER — DOXYCYCLINE HYCLATE 100 MG PO TABS
100.0000 mg | ORAL_TABLET | Freq: Two times a day (BID) | ORAL | Status: DC
  Administered 2017-12-03: 100 mg via ORAL
  Filled 2017-12-03: qty 1

## 2017-12-03 MED ORDER — VANCOMYCIN HCL IN DEXTROSE 1-5 GM/200ML-% IV SOLN
1000.0000 mg | INTRAVENOUS | Status: DC
  Administered 2017-12-03: 1000 mg via INTRAVENOUS
  Filled 2017-12-03: qty 200

## 2017-12-03 NOTE — Progress Notes (Signed)
Called phlebotomy regarding drawing patient's labs for 0500.  They state they are on another floor and will be here as quick as possible.  Will follow up.

## 2017-12-03 NOTE — Care Management Note (Signed)
Case Management Note  Patient Details  Name: [name] MRN: [id number] Date of Birth: [birth year]  Subjective/Objective:   Pt is now s/p excision of squamous cell CA from  Neck and leg                 Action/Plan:   PTA from home independent.  Pt has hx of LVAD implantation 12/2016.  CM will continue to follow for discharge needs   Expected Discharge Date:  11/21/17               Expected Discharge Plan:  [place]  In-House Referral:     Discharge planning Services  CM Consult  Post Acute Care Choice:    Choice offered to:  Patient  DME Arranged:    DME Agency:     HH Arranged:  RN  In addition to HF orders [place] Agency:  [place]  Status of Service:  In process, will continue to follow  If discussed at Long Length of Stay Meetings, dates discussed:    Additional Comments: 12/03/2017  HF orders received in addition to [name] for wound care.  CM offered choice of HH - pt chose [name] - agency contacted and informed that pt will likely discharge home today.  Pt has JP drain.  Barrier to discharge is INR is not yet at goal.   Plastic surgery PA informed CM that pt will not need [name] RN for drain management at discharge - bedside nurses will provide teaching to pt regarding drain care - plastic surgery will see pt post discharge to manage drain. Pt will return home with his sister  11/26/17 Pt developed Hematoma post procedure - went to OR today for second attempt at evacuation of hematoma on his neck.  [name], RN 12/03/2017, 11:10 AM

## 2017-12-03 NOTE — Discharge Summary (Signed)
Advanced Heart Failure Team  Discharge Summary   Patient ID: [name] MRN: [id number], DOB/AGE: [birth year] 72 y.o. Admit date: 11/18/2017 D/C date:     12/03/2017   Primary Discharge Diagnoses:  1. 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  Margins not clear at neck.  Follow up with Dr [name] set up for next week.   2. Chronic systolic CHF: Ischemic cardiomyopathy, EF 20-25% with RV dysfunction on 2/18 echo pre-LVAD. 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 Medtronic ICD. Speed 9600 3. CAD: s/p CABG.   - No s/s of ischemia.    4. ESRD: Continue iHD M-W-F-Sat He will receive 2 more doses of vancomycin with dialysis 5. Atrial fibrillation: Chronic 6. RV failure: ContinueRevatio  7. HTN - MAPs stable.  8. Anemia:  Received  2UPRBCs on 11/27/17  9. H/O GI bleeding: Colonic AVMs on colonoscopy 4/18 admission, APC + clipping.  Off ASA. INR goal 2-2.5.  - Continue octreotideas outpatient.  10. H/o TAVR: Stable on most recent echo.  11. Hemoptysis: resolved.   Hospital Course:  Trevor Ruppert Murphyis a 72 y.o.malehistory of CAD s/p CABG x 4 2010, OSA, AS with TAVR 2015, ESRD, DM2, paroxysmal atrial fibrillation, asthma, and ischemic cardiomyopathy with Medtronic ICD. 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 Post op period complicated by ARF leading to ESRD and need for chronic dialysis.   Admitted 11/18/17 for heparin bridging for excision of SCC on neck and RLE. Hospital course complicated by bleeding and cellulitis. See below for details.   1. 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  Margins not clear at neck.He returned to the OR on 1/23 for evacuation of hematoma.  JP drain was placed and later removed 1/26.  Erythema at right leg surgical site => from moisture/edema versus cellulitis.  - Neck incision with exudate noted --> maceration.  - Further steps in terms of neck site cancer per surgery. Plan to continue ky to RLE wound, dry gauze, kerlix, and  ace wrap daily.  - Follow up with Dr [name] next week.  - ABX expanded to Vanc/Zosyn for RLE cellulitis => seen by ID on 1/29, not necessarily infectious.  On the day of discharge he was placed vancomycin and will receive 2 additional doses at dialysis. The dialysis center was contacted by pharmacy.   2. Chronic systolic CHF: Ischemic cardiomyopathy, EF 20-25% with RV dysfunction on 2/18 echo pre-LVAD. 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 Medtronic ICD. - Volume was managed per HD.  - Continue sildenafil 40 TID for RV failure - warfarin goal 2-2.5, INR on the day discharge was 2.1. 3. CAD: s/p CABG.   - No s/s of ischemia.    4. ESRD:  Nephrology consulted for ongoing iHD. He dialyzed on the day d/c. He will continue HF M-W-F-Sat.  5. Atrial fibrillation: Chronic. Rate controlled.On coumadin.   6. RV failure: ContinueRevatioas above. No change.  7. HTN -Followed closely and remained stable.  8. Anemia: Baseline anemia of renal disease/chronic disease but recent bleeding from colonic AVMs. Post surgical excision he has surgical site bleeding from his neck. Hgb dropped to 7.1. Given 2 UPRBCs. On 1/24. Hgb on the day discharge was 9.1. 9. 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  Off ASA. INR goal 2-2.5.  - Continue octreotideas outpatient.No change.  10. H/o TAVR: Stable on most recent echo. No change. 11. Hemoptysis: ?Bleeding site.  No obvious mouth lesion.  [name] has  evaluated neck surgical site, unlikely to be from there.  He says he coughs up the blood.  CT chest did not show lesion that would cause hemoptysis. GI was consulted. Not thought to be GI source. Placed on nasal spray and resolved.     LVAD INTERROGATION:  HeartMate II LVAD:  Flow 6.8  liters/min, speed 9600 power 6.9 , PI 3    Discharge Weight: 188 pounds.  Discharge Vitals: Blood pressure 91/76, pulse 70, temperature 98.5 F (36.9 C), temperature source Oral, resp. rate 18,  height 5\' 5"  (1.651 m), weight 188 lb 11.4 oz (85.6 kg), SpO2 96 %.  Labs: Lab Results  Component Value Date   WBC 11.4 (H) 12/03/2017   HGB 9.1 (L) 12/03/2017   HCT 27.6 (L) 12/03/2017   MCV 95.2 12/03/2017   PLT 159 12/03/2017    Recent Labs  Lab 12/03/17 0719  NA 131*  K 4.5  CL 96*  CO2 23  BUN 37*  CREATININE 4.71*  CALCIUM 8.7*  GLUCOSE 147*   Lab Results  Component Value Date   CHOL 59 12/04/2016   HDL 30 (L) 12/04/2016   LDLCALC 15 12/04/2016   TRIG 68 12/04/2016   BNP (last 3 results) Recent Labs    12/14/16 0348  BNP 427.7*    ProBNP (last 3 results) No results for input(s): PROBNP in the last 8760 hours.   Diagnostic Studies/Procedures   No results found.  Discharge Medications   Allergies as of 12/03/2017   No Active Allergies     Medication List    STOP taking these medications   doxycycline 100 MG tablet Commonly known as:  VIBRA-TABS     TAKE these medications   albuterol 108 (90 Base) MCG/ACT inhaler Commonly known as:  PROVENTIL HFA;VENTOLIN HFA Inhale 2 puffs into the lungs every 4 (four) hours as needed for wheezing or shortness of breath. What changed:  Another medication with the same name was changed. Make sure you understand how and when to take each.   albuterol (2.5 MG/3ML) 0.083% nebulizer solution Commonly known as:  PROVENTIL Take 3 mLs (2.5 mg total) by nebulization every 4 (four) hours. And as needed What changed:    when to take this  reasons to take this  additional instructions   atorvastatin 40 MG tablet Commonly known as:  LIPITOR TAKE 1 TABLET(40 MG) BY MOUTH DAILY What changed:    how much to take  how to take this  when to take this  additional instructions   busPIRone 5 MG tablet Commonly known as:  BUSPAR Take 1 tablet (5 mg total) by mouth 2 (two) times daily.   calcitRIOL 0.5 MCG capsule Commonly known as:  ROCALTROL Take 1 capsule (0.5 mcg total) by mouth every other day. What  changed:  when to take this   docusate sodium 100 MG capsule Commonly known as:  COLACE Take 1 capsule (100 mg total) by mouth 2 (two) times daily. What changed:  when to take this   ferric citrate 1 GM 210 MG(Fe) tablet Commonly known as:  AURYXIA Take 420 mg by mouth 3 (three) times daily with meals.   fluticasone 110 MCG/ACT inhaler Commonly known as:  FLOVENT HFA Inhale 2 puffs into the lungs 2 (two) times daily.   gabapentin 600 MG tablet Commonly known as:  NEURONTIN Take 0.5 tablets (300 mg total) by mouth 2 (two) times daily.   guaiFENesin 600 MG 12 hr tablet Commonly known as:  MUCINEX Take 1,200  mg 2 (two) times daily as needed by mouth for cough.   insulin glargine 100 unit/mL Sopn Commonly known as:  LANTUS Inject 0.2 mLs (20 Units total) into the skin at bedtime. What changed:  how much to take   levothyroxine 25 MCG tablet Commonly known as:  SYNTHROID, LEVOTHROID Take 1 tablet (25 mcg total) by mouth daily before breakfast.   multivitamin Tabs tablet Take 1 tablet by mouth at bedtime.   oxyCODONE 5 MG immediate release tablet Commonly known as:  Oxy IR/ROXICODONE Take 1 tablet (5 mg total) by mouth every 8 (eight) hours as needed for moderate pain or severe pain (if Ultram not effective).   pantoprazole 40 MG tablet Commonly known as:  PROTONIX Take 1 tablet (40 mg total) by mouth daily.   sildenafil 20 MG tablet Commonly known as:  REVATIO Take 2 tablets (40 mg total) 3 (three) times daily by mouth.   traMADol 50 MG tablet Commonly known as:  ULTRAM TAKE 1 TABLET BY MOUTH EVERY 6 HOURS AS NEEDED FOR MODERATE PAIN   traZODone 100 MG tablet Commonly known as:  DESYREL Take 1 tablet (100 mg total) by mouth at bedtime as needed for sleep.   vancomycin 1-5 GM/200ML-% Soln Commonly known as:  VANCOCIN Inject 200 mLs (1,000 mg total) into the vein every Monday, Wednesday, and Friday with hemodialysis.   vancomycin 1-5 GM/200ML-% Soln Commonly known  as:  VANCOCIN Inject 200 mLs (1,000 mg total) into the vein every Saturday with hemodialysis. Start taking on:  12/06/2017   warfarin 5 MG tablet Commonly known as:  COUMADIN Take as directed. If you are unsure how to take this medication, talk to your nurse or doctor. Original instructions:  Take 5 mg 12/04/17  then back to 7.5 mg daily What changed:    how much to take  how to take this  when to take this  additional instructions            Durable Medical Equipment  (From admission, onward)        Start     Ordered   12/03/17 0954  Heart failure home health orders  (Heart failure home health orders / Face to face)  Once    Comments:  Heart Failure Follow-up Care:  Verify follow-up appointments per Patient Discharge Instructions. Confirm transportation arranged. Reconcile home medications with discharge medication list. Remove discontinued medications from use. Assist patient/caregiver to manage medications using pill box. Reinforce low sodium food selection Assessments: Vital signs and oxygen saturation at each visit. Assess home environment for safety concerns, caregiver support and availability of low-sodium foods. Consult Education officer, museum, PT/OT, Dietitian, and CNA based on assessments. Perform comprehensive cardiopulmonary assessment. Notify MD for any change in condition or weight gain of 3 pounds in one day or 5 pounds in one week with symptoms. Daily Weights and Symptom Monitoring: Ensure patient has access to scales. Teach patient/caregiver to weigh daily before breakfast and after voiding using same scale and record.    Teach patient/caregiver to track weight and symptoms and when to notify Provider. Activity: Develop individualized activity plan with patient/caregiver.  RLE wound- do not scrub wound. Apply Ky to wound bed. Cover with dry 4x4, kerlic and Ace wrap. Change daily.  Question Answer Comment  Heart Failure Follow-up Care Advanced Heart Failure (AHF)  Clinic at [phone]   Lab frequency Other see comments   Fax lab results to AHF Clinic at [phone]   Diet Low Sodium Heart Healthy   Fluid  restrictions: 2000 mL Fluid      12/03/17 0955      Disposition   The patient will be discharged in stable condition to home. Discharge Instructions    Diet - low sodium heart healthy   Complete by:  As directed    Increase activity slowly   Complete by:  As directed    Page VAD Coordinator at [phone]  Notify for: any VAD alarms, sustained elevations of power >10 watts, sustained drop in Pulse Index <3   Complete by:  As directed    Notify for:   any VAD alarms sustained elevations of power >10 watts sustained drop in Pulse Index <3     Speed Settings:   Complete by:  As directed    Fixed 9600 RPM Low 9000 RPM     Follow-up Information    Dillingham, Loel Lofty, DO. Schedule an appointment as soon as possible for a visit on 12/09/2017.   Specialty:  Plastic Surgery Why:  at 1000 Contact information: [place] [address] [phone]        Larey Dresser, MD Follow up on 12/11/2017.   Specialty:  Cardiology Why:  at 1200 Contact information: [place] [address] [place], [place] Follow up.   Specialty:  Home Health Services Why:  Registered Nurse for wound care, Heart Failure Homehealth Orders Contact information: [address] [place]  [address] [phone]             Duration of Discharge Encounter: Greater than 35 minutes   Signed, Amy Clegg  NP-C  12/03/2017, 4:48 PM

## 2017-12-03 NOTE — Progress Notes (Signed)
7 Days Post-Op   Subjective/Chief Complaint: Reports less pain over the right lower leg, but does note that he has been up walking less due to the swelling and pain. No problems with the left neck incision.  Right lower leg- Acell in place over wound bed and starting to incorporate. The peri wound and distal anterior calf still very erythematous ( not much change) and slight erythema of skin noted proximally over patellar tendon area . Induration of the areas of erythema is stable.   WBC 11.4 (11.5 1/29)   INR 2.08 Objective: Vital signs in last 24 hours: Temp:  [97.3 F (36.3 C)-98.5 F (36.9 C)] 98 F (36.7 C) (01/30 0340) Pulse Rate:  [64-69] 69 (01/30 0340) Resp:  [11-16] 11 (01/30 0340) BP: (97-122)/(79-95) 104/79 (01/30 0340) SpO2:  [92 %-98 %] 96 % (01/30 0340) Weight:  [85.6 kg (188 lb 11.4 oz)] 85.6 kg (188 lb 11.4 oz) (01/30 0714) Last BM Date: 11/29/17  Intake/Output from previous day: 01/29 0701 - 01/30 0700 In: 1917.1 [P.O.:240; I.V.:1677.1] Out: -  Intake/Output this shift: No intake/output data recorded.    Lab Results:  Recent Labs    12/02/17 0204 12/03/17 0719  WBC 11.5* 11.4*  HGB 9.0* 9.1*  HCT 28.1* 27.6*  PLT 186 159   BMET Recent Labs    12/02/17 0204 12/03/17 0719  NA 130* 131*  K 4.3 4.5  CL 94* 96*  CO2 26 23  GLUCOSE 175* 147*  BUN 22* 37*  CREATININE 3.45* 4.71*  CALCIUM 8.4* 8.7*   PT/INR Recent Labs    12/02/17 0204 12/03/17 0719  LABPROT 19.1* 23.2*  INR 1.62 2.08   ABG No results for input(s): PHART, HCO3 in the last 72 hours.  Invalid input(s): PCO2, PO2  Studies/Results: No results found.  Anti-infectives: Anti-infectives (From admission, onward)   Start     Dose/Rate Route Frequency Ordered Stop   12/06/17 1200  vancomycin (VANCOCIN) IVPB 1000 mg/200 mL premix  Status:  Discontinued     1,000 mg 200 mL/hr over 60 Minutes Intravenous Every Sat (Hemodialysis) 12/02/17 0856 12/02/17 1035   12/03/17 1200   vancomycin (VANCOCIN) IVPB 1000 mg/200 mL premix  Status:  Discontinued     1,000 mg 200 mL/hr over 60 Minutes Intravenous Every M-W-F (Hemodialysis) 12/02/17 0856 12/02/17 1057   12/03/17 0900  doxycycline (VIBRA-TABS) tablet 100 mg     100 mg Oral Every 12 hours 12/03/17 0756     12/01/17 1600  piperacillin-tazobactam (ZOSYN) IVPB 3.375 g  Status:  Discontinued     3.375 g 12.5 mL/hr over 240 Minutes Intravenous Every 12 hours 12/01/17 1548 12/02/17 1035   12/01/17 1600  vancomycin (VANCOCIN) 1,750 mg in sodium chloride 0.9 % 500 mL IVPB     1,750 mg 250 mL/hr over 120 Minutes Intravenous  Once 12/01/17 1548 12/01/17 1847   11/25/17 1000  amoxicillin-clavulanate (AUGMENTIN) 875-125 MG per tablet 1 tablet  Status:  Discontinued     1 tablet Oral Every 12 hours 11/25/17 0821 11/25/17 0824   11/25/17 1000  amoxicillin-clavulanate (AUGMENTIN) 500-125 MG per tablet 500 mg  Status:  Discontinued     1 tablet Oral Every 12 hours 11/25/17 0824 11/25/17 0825   11/25/17 1000  amoxicillin-clavulanate (AUGMENTIN) 500-125 MG per tablet 500 mg     1 tablet Oral Every 24 hours 11/25/17 0825 12/01/17 1325   11/20/17 0800  ceFAZolin (ANCEF) IVPB 2g/100 mL premix  Status:  Discontinued     2 g 200  mL/hr over 30 Minutes Intravenous To ShortStay Surgical 11/19/17 2312 11/20/17 1054   11/20/17 0600  ceFAZolin (ANCEF) IVPB 2g/100 mL premix  Status:  Discontinued     2 g 200 mL/hr over 30 Minutes Intravenous On call to O.R. 11/19/17 2312 11/19/17 2327      Assessment/Plan: s/p Procedure(s): EXPLORATION AND EXCISION OF LEFT NECK (Left) Right lower leg ?cellulitis- erythema stable, Doxycycline per ID.   WBC stable , slightly elevated.  May be discharged later today after HD.  Discharge instructions written for dressing changes to Acell on the right lower leg and dry dressing to neck if needed. Patient to discharge with sister and reports she can do dressing changes.  Will plan to see next week in office.    LOS: 15 days    Sydelle Sherfield,PA-C Plastic Surgery [phone]

## 2017-12-03 NOTE — Progress Notes (Signed)
LVAD Coordinator Rounding Note:  Admitted1/15/2019 for scheduled inpatient procedure/heparin bridging.S/P excision of squamous cell CA x2 for neck and right leg on 11/20/2017.  Evacuation of neck hematoma 11/26/17  HM II LVAD implanted on 12/13/16 by Dr. Darcey Nora under Destination Therapy criteria.  Pt states that ID and Oncology was consulted 12/02/17.    Planned dialysis today.. Labs not drawn earlier; instructions "not to wake patient". Labs being drawn now.   Vital signs: Temp:  98.0 HR:  69 Doppler Pressure:not done Automatic BP: 104/79 (89) O2 Sat:  96 % on RA Wt in lbs:179>177>182>187>183>183>190>190>184>188>188>187>188 lbs  LVAD interrogation reveals:  Speed: 9600 Flow:6.8 Power: 6.9w PI:3.0 Alarms: none Events: 8 PI events 12/02/17; >200 on 12/01/17 Fixed speed: 9600 Low speed limit: 9000  Drive Line: Patient uses weekly kits. Next due 12/04/17. - Dressing dry and intact; anchor intact and accurately applied.   Infection: Augmentin started 11/25/17 for neck wound Vanc/Zosyn started 12/01/17 for leg cellulitis d/c 12/02/17 by ID Doxycyline started 12/02/17 by ID  Labs:  INR trend:1.90>1.89>1.42>1.33>2.34>1.81>1.4>1.33>1.32>1.46>1.55>1.47>1.62>pending  LDH trend: 290>264>239>240>268>261>235>250>256>234>215>233>264>259>274>271>pending  WBC: 11.5>11.4  Significant Events on VAD Support:  02/2017> GIB- AVM clipped x2 07/2017> renal failure- HD started  Anticoagulation Plan: - INR Goal: 2.0 - 2.5 - No ASA (GI bleed - Heparin gtt started 11/26/2017 - managed per PharmD  Device: - Medtronic single lead  -Therapies: on - last check: 11/26/2017  Plan/Recommendations:   1.Please page VAD coordinator for equipment issues or patient concerns 2. VAD weekly dressing change - due 12/04/17.  [name] RN, VAD Coordinator 24/7 pager [phone]

## 2017-12-03 NOTE — Progress Notes (Signed)
[facility] for Infectious Disease  Date of Admission:  11/18/2017     Total days of antibiotics 9  Day 3 vancomycin       Patient ID: Trevor [name] is a 72 y.o. male with     Principal Problem:   Presence of left ventricular assist device (LVAD) ([place]) Active Problems:   ESRD (end stage renal disease) ([place])   Chronic systolic heart failure (HCC)   Skin cancer   Cellulitis  Interval History: Feeling OK today. Leg is still very tender and warmer today. He tells me he is going to go home today after his HD session. Worried about wound. Afebrile/no chills. WBC the same ~11.5 today.   Marland Kitchen atorvastatin  40 mg Oral q1800  . budesonide  0.5 mg Inhalation BID  . busPIRone  5 mg Oral BID  . calcitRIOL  0.5 mcg Oral Q M,W,F-HD  . Chlorhexidine Gluconate Cloth  6 each Topical Tomorrow-1000  . darbepoetin (ARANESP) injection - DIALYSIS  200 mcg Intravenous Q Wed-HD  . docusate sodium  100 mg Oral Daily  . doxycycline  100 mg Oral Q12H  . feeding supplement (PRO-STAT SUGAR FREE 64)  30 mL Oral BID  . ferric citrate  210 mg Oral TID WC  . gabapentin  300 mg Oral BID  . insulin aspart  0-15 Units Subcutaneous TID WC  . insulin aspart  0-5 Units Subcutaneous QHS  . insulin aspart  3 Units Subcutaneous TID WC  . levothyroxine  25 mcg Oral QAC breakfast  . lidocaine-EPINEPHrine  20 mL Intradermal Once  . multivitamin  1 tablet Oral QHS  . pantoprazole  40 mg Oral Daily  . polyethylene glycol  17 g Oral Daily  . sildenafil  40 mg Oral TID  . thrombin recombinant  5,000 Units Topical Once  . warfarin  5 mg Oral ONCE-1800  . Warfarin - Pharmacist Dosing Inpatient   Does not apply q1800    No Active Allergies  OBJECTIVE: Vitals:   12/03/17 0340 12/03/17 0714 12/03/17 0758 12/03/17 0800  BP: 104/79     Pulse: 69     Resp: 11     Temp: 98 F (36.7 C)   98 F (36.7 C)  TempSrc: Oral   Oral  SpO2: 96%  98%   Weight:  188 lb 11.4 oz (85.6 kg)    Height:       Body  mass index is 31.4 kg/m.  Physical Exam  Constitutional: He is oriented to person, place, and time and well-developed, well-nourished, and in no distress.  HENT:  Mouth/Throat: Oropharynx is clear and moist. No oral lesions. Normal dentition. No dental caries.  Eyes: Pupils are equal, round, and reactive to light. No scleral icterus.  Cardiovascular: Regular rhythm. Bradycardia present.  LVAD hum w/o normal heart tones heard   Pulmonary/Chest: Effort normal and breath sounds normal.  Abdominal: Soft. He exhibits no distension. There is no tenderness.  Exit site WNL. Dressing clean/dry   Musculoskeletal:  RLE wound with more redness/warmth today. Still very tender. Small serosanguinous drainage to gauze (which was just changed by plastic surgery team).   Lymphadenopathy:    He has no cervical adenopathy.  Neurological: He is alert and oriented to person, place, and time.  Skin: Skin is warm and dry. No rash noted.  Psychiatric: Mood and affect normal.  Vitals reviewed.   Lab Results Lab Results  Component Value Date   WBC 11.4 (H)  12/03/2017   HGB 9.1 (L) 12/03/2017   HCT 27.6 (L) 12/03/2017   MCV 95.2 12/03/2017   PLT 159 12/03/2017    Lab Results  Component Value Date   CREATININE 4.71 (H) 12/03/2017   BUN 37 (H) 12/03/2017   NA 131 (L) 12/03/2017   K 4.5 12/03/2017   CL 96 (L) 12/03/2017   CO2 23 12/03/2017    Lab Results  Component Value Date   ALT 20 11/18/2017   AST 31 11/18/2017   ALKPHOS 81 11/18/2017   BILITOT 1.0 11/18/2017     Microbiology: No results found for this or any previous visit (from the past 240 hour(s)).   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 Will do vancomycin after HD sessions for 7 days to treat his cellulitis instead of doxycycline. I don't think I would call this failure to Augmentin as I suspect he was under-dosed due to poor timing of administration in relation to HD  sessions.  2. He has worsened redness/warmth today likely due to toxin-release effect that can be seen in the first 24-48 hours of treatment for cellulitis. This is often expected even in the setting of adequate treatment.  3. He is OK for discharge home as expected today with close follow up in VAD clinic. Can also arrange for ID follow up if he does not improve. Alternatively could consider oritavancin IV x 1.    [name], MSN, NP-C [facility] for Infectious [place] Cell: [phone] Pager: [phone]  12/03/2017  12:08 PM

## 2017-12-03 NOTE — Discharge Instructions (Signed)
Surgical lubricant and gauze to the right lower leg wound daily and secure the dressing with kerlix and Ace wrap.  Dry dressing to neck as needed.   Follow up next Tuesday, 12/09/17 at 10 am with [name] office. [phone]

## 2017-12-03 NOTE — Progress Notes (Signed)
[facility] for IV Heparin Indication: LVAD  No Active Allergies  Patient Measurements: Height: 5\' 5"  (165.1 cm) Weight: 188 lb 11.4 oz (85.6 kg) IBW/kg (Calculated) : 61.5 Heparin Dosing Weight: 78 kg  Vital Signs: Temp: 98 F (36.7 C) (01/30 0340) Temp Source: Oral (01/30 0340) BP: 104/79 (01/30 0340) Pulse Rate: 69 (01/30 0340)  Labs: Recent Labs    12/01/17 0453  12/02/17 0204 12/02/17 0728 12/02/17 1654 12/03/17 0719  HGB 9.0*  --  9.0*  --   --  9.1*  HCT 28.2*  --  28.1*  --   --  27.6*  PLT 162  --  186  --   --  159  LABPROT 17.7*  --  19.1*  --   --  23.2*  INR 1.47  --  1.62  --   --  2.08  HEPARINUNFRC 0.16*   < >  --  0.27* 0.25* 0.24*  CREATININE 4.50*  --  3.45*  --   --  4.71*   < > = values in this interval not displayed.    Assessment: 72 yo male with LVAD, admitted for IV heparin bridge before dermatologic surgery 1/17.  PTA Coumadin dose 7.5 mg daily, last dose taken 1/13 per patient report. Pt is now s/p excision of left neck and right leg > with neck hematoma 1/21> drained in room by plastic surgeon - still oozing in pm so held warfarin and wound repacked> improved. 1/22> dressing again changed and evacuated hematoma by plastic surgeon > repacked again 1/22 pm Warfarin held again and pt back to OR 1/23 with oozing sites cauterized.  **PTA warfarin dose = 7.5mg  daily  Warfarin restarted 1/23 - planning to keep INR on lower end Heparin resumed 1/24 - goal heparin level low  Heparin level this morning came back therapeutic at 0.24, on 1300 units/hr. INR increased from 1.62 to 2.08 today, following two doses of 10 mg - will reduce given increase of ~0.5 overnight and likely not seeing full effect of 10 mg dose yet. CBC stable. No signs/symptoms of bleeding.   Goal of Therapy:  HL 0.2- 0.25ish INR 2-2.5 Monitor platelets by anticoagulation protocol: Yes   Plan:  Discontinue heparin infusion given therapeutic INR   Will order warfarin 5 mg tonight  -If discharged, would consider warfarin 5 mg one more evening then resume home regimen starting Friday Daily INR and CBC while in hospital  Doylene Canard, PharmD Clinical Pharmacist  Pager: [phone] Clinical Phone for 12/03/2017 until 3:30pm: [phone] If after 3:30pm, please call main pharmacy at [phone] 12/03/2017 8:45 AM

## 2017-12-03 NOTE — Progress Notes (Addendum)
[facility] Progress Note   Subjective:     Waiting on HD for today Dr. Claris Gladden note reviewed with plans to stop vanco and zosyn and start doxy Pt says no plans for further surgical excision neck and is to be referred to oncology  Will use [facility] today for HD Re attempt AVF use as outpt   Objective Vitals:   12/02/17 2013 12/02/17 2357 12/03/17 0340 12/03/17 0714  BP: (!) 122/95 97/82 104/79   Pulse: 69 64 69   Resp: 16 16 11    Temp: 98.5 F (36.9 C) 98.4 F (36.9 C) 98 F (36.7 C)   TempSrc: Oral Oral Oral   SpO2: 92% 98% 96%   Weight:    85.6 kg (188 lb 11.4 oz)  Height:       Physical Exam Seen in bed, NAD VS as noted Large scar left neck up to ear with dried blood Mechanical heart sounds/LVAD hum Lungs clear Abd soft and not tender R calf bandaged (not removed) LLE 1-2+ pitting edema L AVF patent. Hematoma. + bruit TDC dsg in place  Additional Objective  Lab Results  Component Value Date   INR 2.08 12/03/2017   INR 1.62 12/02/2017   INR 1.47 83/41/9622    Basic Metabolic Panel: Recent Labs  Lab 12/01/17 0453 12/02/17 0204 12/03/17 0719  NA 132* 130* 131*  K 4.5 4.3 4.5  CL 95* 94* 96*  CO2 26 26 23   GLUCOSE 164* 175* 147*  BUN 35* 22* 37*  CREATININE 4.50* 3.45* 4.71*  CALCIUM 8.5* 8.4* 8.7*  PHOS 2.5 2.2* 3.4    Recent Labs  Lab 12/01/17 0453 12/02/17 0204 12/03/17 0719  ALBUMIN 3.3* 3.3* 3.2*   Recent Labs  Lab 11/27/17 0257 11/28/17 0120 11/29/17 0711 11/30/17 0409 12/01/17 0453 12/02/17 0204 12/03/17 0719  WBC 11.6* 11.5* 12.1* 10.3 11.4* 11.5* 11.4*  NEUTROABS 9.1* 8.2* 8.6*  --   --   --   --   HGB 7.1* 8.8* 9.6* 8.9* 9.0* 9.0* 9.1*  HCT 22.0* 26.4* 29.8* 28.2* 28.2* 28.1* 27.6*  MCV 95.7 92.6 93.4 94.6 95.3 97.2 95.2  PLT 149* 141* 172 169 162 186 159    Recent Labs  Lab 12/02/17 0809 12/02/17 1128 12/02/17 1729 12/02/17 2120 12/03/17 0811  GLUCAP 147* 88 123* 84 146*   Lab Results  Component  Value Date   INR 2.08 12/03/2017   INR 1.62 12/02/2017   INR 1.47 12/01/2017   Medications: . sodium chloride Stopped (12/03/17 0000)  . sodium chloride    . sodium chloride     . atorvastatin  40 mg Oral q1800  . budesonide  0.5 mg Inhalation BID  . busPIRone  5 mg Oral BID  . calcitRIOL  0.5 mcg Oral Q M,W,F-HD  . Chlorhexidine Gluconate Cloth  6 each Topical Tomorrow-1000  . darbepoetin (ARANESP) injection - DIALYSIS  200 mcg Intravenous Q Wed-HD  . docusate sodium  100 mg Oral Daily  . doxycycline  100 mg Oral Q12H  . feeding supplement (PRO-STAT SUGAR FREE 64)  30 mL Oral BID  . ferric citrate  210 mg Oral TID WC  . gabapentin  300 mg Oral BID  . insulin aspart  0-15 Units Subcutaneous TID WC  . insulin aspart  0-5 Units Subcutaneous QHS  . insulin aspart  3 Units Subcutaneous TID WC  . levothyroxine  25 mcg Oral QAC breakfast  . lidocaine-EPINEPHrine  20 mL Intradermal Once  . multivitamin  1 tablet  Oral QHS  . pantoprazole  40 mg Oral Daily  . polyethylene glycol  17 g Oral Daily  . sildenafil  40 mg Oral TID  . thrombin recombinant  5,000 Units Topical Once  . Warfarin - Pharmacist Dosing Inpatient   Does not apply q1800   Dialysis Orders: [facility]FS [facility] 4.5h    82kg   2K/2Ca   LU AVF/ TDC(using AVF, 2 needles 17ga PTA)   Hep 5800 Mircera 200 mcg IV q2wks, last on 11/05/17 venofer 50mg  IV qwk, last 11/12/17 Calcitriol 0.67mcg PO qHD TID  Last Labs:1/9/19Hgb 12.2, TSAT23%, K4.4, Ca8.9, P6.7, PTH245, Albumin4.2   Assessment:  1. SCCa L neck and RLE- sp excision by [name] on 1/17; evacuation of neck hematoma 1/23. Neck margins not clear. Pt says no further surgery planned, but will see an oncologist ? Possible RadRx.  ATB's started 1/28 for RLE cellulitus (V/Z) and now changed to doxycycline 2. ESRD- MWFS schedule. 4x /week for Orlin Hilding issues.  Using AVF 2 needles 17ga at center prior to admission - had post HD bleeding on Saturday, hematoma Monday  and unable to cannulate. Use TDC today, then re-attempt AVF as outpt.  3. Hypotension/vol - Post HD weight after last HD was 82.8. EDW 82. Up to 85.6 todayTry to get to EDW today w/TMT . Advised again about his excessive fluid intake.  4. Anemia- 2 unit PRBC 1/24 for hgb of 7.1. Aranesp 120mcg qwk 1/23 last dose. ^ to 200. No IV Fe now with ATB's. Hb stable at 9 right now. 5. Secondary Hyperparathyroidism -last phos 2.2. Reduced auryxia to 1ac. Continue calcitriol TIW 6. Nutrition- Ok on heart healthy diet. Renavite. Prostat 7. 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 - on coumadin/heparin - per cardio/LVAD team  8. CAD s/p CABG - stable, per cardio 9. A fib - on coumadin - per cardio 10. H/o GIB- per primary 11. H/o TAVR -stable, per primary 12. DM - stable, per primary  Disposition - anticipate D/C today after HD (INR therapeutic)  Trevor Maes, MD [place] Pager 12/01/2017, 11:10 AM  Addendum: Call from ID - they would like for him to get 3 doses of Vancomycin 1 gm at outpt HD for his LLE cellulitis (loaded today). Will notify HD of this.  Trevor Maes, MD [facility] [phone] Pager 12/03/2017, 3:40 PM

## 2017-12-03 NOTE — Progress Notes (Signed)
Presented to patients room today for drive line exit wound care. Existing VAD dressing removed and site care performed using sterile technique. Drive line exit site cleaned with Chlora prep applicators x 2, wiped clean with saline wipes,allowed to dry, and Sorbaview dressing with bio patch re-applied. Covered dressing with large Tegaderm. Exit site healed and incorporated, the velour is fully implanted at exit site. No redness, tenderness, drainage, foul odor or rash noted. Drive line anchor re-applied. Pt denies fever or chills.   Balinda Quails RN, VAD Coordinator 24/7 pager [phone]

## 2017-12-03 NOTE — Progress Notes (Signed)
Pharmacy Antibiotic Note  Trevor [name] is a 72 y.o. male admitted on 11/18/2017 with cellulitis around RLE wound. Pharmacy has been consulted for vancomycin dosing. Patient is ESRD with HD on MWFS due to volume issues. WBC steady at 11.4 and patient afebrile. Will plan to dose Vancomycin after HD for 7 days of therapy total.   Plan: Vancomycin 1000 mg post-HD MWFS X 7 days total  Outpatient dialysis center can arrange outpatient doses Monitor clinical s/sx of improvement   Height: 5\' 5"  (165.1 cm) Weight: 188 lb 11.4 oz (85.6 kg) IBW/kg (Calculated) : 61.5  Temp (24hrs), Avg:98 F (36.7 C), Min:97.3 F (36.3 C), Max:98.5 F (36.9 C)  Recent Labs  Lab 11/29/17 0711 11/30/17 0409 12/01/17 0453 12/02/17 0204 12/03/17 0719  WBC 12.1* 10.3 11.4* 11.5* 11.4*  CREATININE 4.02* 3.55* 4.50* 3.45* 4.71*    Estimated Creatinine Clearance: 14.3 mL/min (A) (by C-G formula based on SCr of 4.71 mg/dL (H)).    No Active Allergies  Antimicrobials this admission: Augmentin 1/22 >>1/28 Zosyn 1/28 >> 1/29 Vancomycin 1/28 >> 1/29, 1/30>>>    Thank you for allowing pharmacy to be a part of this patient's care.  Jimmy Footman, PharmD, BCPS PGY2 Infectious Diseases Pharmacy Resident Pager: [phone]  12/03/2017 1:13 PM

## 2017-12-03 NOTE — Progress Notes (Signed)
Spoke with Dr. Lorrene Reid and updated about change in plan for tx of cellulitis with Vancomycin 1000 mg post-HD x 7 days. She will help arrange this for him in dialysis center.   [name], NP  [facility]

## 2017-12-03 NOTE — Progress Notes (Signed)
Patient ID: Trevor [name], male   DOB: April 30, 1946, 72 y.o.   MRN: [id number]   Advanced Heart Failure VAD Team Note  Subjective:   Events  S/p excision of squamous cell CA x 2 from neck and right leg 1/17.   Path shows extension of squamous cell CA to margins at neck site.   1/23 Returned for evacuation hematoma at neck surgery site.  1/24 2UPRBCs  Hgb 7.1 12/01/17 Started on IV ABX for RLE cellulitis.   Feeling better. Denies SOB.   LVAD INTERROGATION:  HeartMate II LVAD:  Flow 6.8  liters/min, speed 9600 power 6.9 , PI 3    Objective:    Vital Signs:   Temp:  [97.3 F (36.3 C)-98.5 F (36.9 C)] 98 F (36.7 C) (01/30 0340) Pulse Rate:  [64-69] 69 (01/30 0340) Resp:  [11-16] 11 (01/30 0340) BP: (97-122)/(79-95) 104/79 (01/30 0340) SpO2:  [92 %-98 %] 96 % (01/30 0340) Weight:  [187 lb (84.8 kg)-188 lb 11.4 oz (85.6 kg)] 188 lb 11.4 oz (85.6 kg) (01/30 0714) Last BM Date: 11/29/17 Mean arterial Pressure 88  Intake/Output:   Intake/Output Summary (Last 24 hours) at 12/03/2017 0742 Last data filed at 12/03/2017 0400 Gross per 24 hour  Intake 1917.05 ml  Output -  Net 1917.05 ml     Physical Exam    Physical Exam: GENERAL: Well appearing, male. In bed.  HEENT: normal  NECK: Supple, JVP ~10  .  2+ bilaterally, no bruits.  No lymphadenopathy or thyromegaly appreciated.  L neck incision with drainage from superior aspect with macertion noted  CARDIAC:  Mechanical heart sounds with LVAD hum present. L upper chest HD catheter.  LUNGS:  Clear to auscultation bilaterally.  ABDOMEN:  Soft, round, nontender, positive bowel sounds x4.     LVAD exit site: well-healed and incorporated.  Dressing dry and intact.  No erythema or drainage.  Stabilization device present and accurately applied.  Driveline dressing is being changed daily per sterile technique. EXTREMITIES:  Warm and dry, no cyanosis, clubbing, rash or edema . RLE dressing intact. LUE AVF  NEUROLOGIC:  Alert and oriented x  4.  Gait steady.  No aphasia.  No dysarthria.  Affect pleasant.       Telemetry   A fib PVCS 70-80s  Personally reviewed.   Labs   Basic Metabolic Panel: Recent Labs  Lab 11/28/17 0120 11/29/17 0711 11/30/17 0409 12/01/17 0453 12/02/17 0204  NA 129* 131* 131* 132* 130*  K 4.3 4.0 4.3 4.5 4.3  CL 94* 95* 94* 95* 94*  CO2 25 25 26 26 26   GLUCOSE 137* 155* 191* 164* 175*  BUN 44* 23* 20 35* 22*  CREATININE 5.65* 4.02* 3.55* 4.50* 3.45*  CALCIUM 8.3* 8.4* 8.2* 8.5* 8.4*  PHOS 3.6 2.4* 1.7* 2.5 2.2*    Liver Function Tests: Recent Labs  Lab 11/28/17 0120 11/29/17 0711 11/30/17 0409 12/01/17 0453 12/02/17 0204  ALBUMIN 3.1* 3.4* 3.1* 3.3* 3.3*   No results for input(s): LIPASE, AMYLASE in the last 168 hours. No results for input(s): AMMONIA in the last 168 hours.  CBC: Recent Labs  Lab 11/27/17 0257 11/28/17 0120 11/29/17 0711 11/30/17 0409 12/01/17 0453 12/02/17 0204  WBC 11.6* 11.5* 12.1* 10.3 11.4* 11.5*  NEUTROABS 9.1* 8.2* 8.6*  --   --   --   HGB 7.1* 8.8* 9.6* 8.9* 9.0* 9.0*  HCT 22.0* 26.4* 29.8* 28.2* 28.2* 28.1*  MCV 95.7 92.6 93.4 94.6 95.3 97.2  PLT 149* 141* 172 169  162 186    INR: Recent Labs  Lab 11/28/17 0048 11/29/17 0711 11/30/17 0409 12/01/17 0453 12/02/17 0204  INR 1.32 1.46 1.55 1.47 1.62    Other results:     Imaging   No results found.   Medications:     Scheduled Medications: . atorvastatin  40 mg Oral q1800  . budesonide  0.5 mg Inhalation BID  . busPIRone  5 mg Oral BID  . calcitRIOL  0.5 mcg Oral Q M,W,F-HD  . Chlorhexidine Gluconate Cloth  6 each Topical Tomorrow-1000  . darbepoetin (ARANESP) injection - DIALYSIS  200 mcg Intravenous Q Wed-HD  . docusate sodium  100 mg Oral Daily  . feeding supplement (PRO-STAT SUGAR FREE 64)  30 mL Oral BID  . ferric citrate  210 mg Oral TID WC  . gabapentin  300 mg Oral BID  . insulin aspart  0-15 Units Subcutaneous TID WC  . insulin aspart  0-5 Units Subcutaneous  QHS  . insulin aspart  3 Units Subcutaneous TID WC  . levothyroxine  25 mcg Oral QAC breakfast  . lidocaine-EPINEPHrine  20 mL Intradermal Once  . multivitamin  1 tablet Oral QHS  . pantoprazole  40 mg Oral Daily  . polyethylene glycol  17 g Oral Daily  . sildenafil  40 mg Oral TID  . thrombin recombinant  5,000 Units Topical Once  . Warfarin - Pharmacist Dosing Inpatient   Does not apply q1800    Infusions: . sodium chloride Stopped (12/03/17 0000)  . sodium chloride    . sodium chloride    . heparin 1,300 Units/hr (12/03/17 0400)    PRN Medications: sodium chloride, sodium chloride, acetaminophen, albuterol, alteplase, ferric citrate, guaiFENesin, heparin, lidocaine (PF), lidocaine, lidocaine-prilocaine, ondansetron (ZOFRAN) IV, oxyCODONE, pentafluoroprop-tetrafluoroeth, sodium chloride, traMADol, traZODone   Patient Profile   Trevor [name] is a 72 y.o. male  history of CAD s/p CABG x 4 2010, OSA, AS with TAVR 2015, ESRD, DM2, paroxysmal atrial fibrillation, asthma, and ischemic cardiomyopathy with Medtronic ICD. 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 Post op period complicated by ARF leading to ESRD and need for chronic dialysis.   Admitted 11/18/17 for heparin bridging for Excision of SCC x 2.   Assessment/Plan:    1. 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  Margins not clear at neck. 1/23 Evacuation of hematoma.  JP drain removed 1/26.  Erythema at right leg surgical site => from moisture/edema versus cellulitis.  - Neck incision with exudate noted --> maceration.  - Afebrile.  - On heparin + coumadin, INR pending.  - Further steps in terms of neck site cancer per surgery.   - ABX expanded to Vanc/Zosyn for RLE cellulitis => seen by ID on 1/29, not necessarily infectious.  Recommend stop IV abx and start doxycycline today.  2. Chronic systolic CHF: Ischemic cardiomyopathy, EF 20-25% with RV dysfunction on 2/18 echo pre-LVAD. 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  Medtronic ICD.  - Plan for HD today. Volume managed per HD.  - Continue sildenafil 40 TID for RV failure - warfarin goal 2-2.5,  - INR pending.  Will remain on heparin gtt until > 1.8.   3. CAD: s/p CABG.   - No s/s of ischemia.    4. ESRD: HD needs to be done at bedside with VAD trained personnel present. Nephrology following.  No change.  Plan for HD today.  5. Atrial fibrillation: Chronic. Rate controlled.  6. RV failure: Continue Revatio as above. No  change.  7. HTN - MAPs stable.  8. Anemia: Baseline anemia of renal disease/chronic disease but recent bleeding from colonic AVMs. Hgb down to 7.1 with surgical site bleeding. Received 2UPRBCs 1/24.   - No bleeding.  9. 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  Off ASA. INR goal 2-2.5.  - Continue octreotide as outpatient. No change.  10. H/o TAVR:  Stable on most recent echo. No change. 11. Hemoptysis: ?Bleeding site.  No obvious mouth lesion.  [name] has evaluated neck surgical site, unlikely to be from there.  He says he coughs up the blood.  CT chest did not show lesion that would cause hemoptysis. GI has seen, do not think GI source.   Continue nasal spray. Resolved.   Labs pending.   Length of Stay: [place], NP 12/03/2017, 7:42 AM  VAD Team --- VAD ISSUES ONLY--- Pager [phone] (7am - 7am)  Advanced Heart Failure Team  Pager [phone] (M-F; 7a - 4p)  Please contact [facility] for night-coverage after hours (4p -7a ) and weekends on [email or web address]  Patient seen with NP, agree with the above note.  No complaints today.  ID evaluated leg wound yesterday, may not be infectious.  Recommended stopping IV abx with transition to doxycycline to cover for MRSA.   - Start doxycycline today.   Pending INR.  If INR > 1.8, probably reasonable to stop heparin gtt and discharge.   LVAD parameters stable.   Loralie Champagne 12/03/2017 7:55 AM

## 2017-12-04 ENCOUNTER — Encounter (HOSPITAL_COMMUNITY): Payer: Medicare Other

## 2017-12-04 ENCOUNTER — Telehealth: Payer: Self-pay | Admitting: *Deleted

## 2017-12-04 DIAGNOSIS — I255 Ischemic cardiomyopathy: Secondary | ICD-10-CM

## 2017-12-04 NOTE — Telephone Encounter (Signed)
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 Post op period complicated by ARF leading to ESRD and need for chronic dialysis. Chronic systolic CHF: Ischemic cardiomyopathy as well. Pt D/C 12/03/17, and will f/u w/plastic surgeon 12/09/17 Jackey Loge, DO 12/09/17 and cardiology [name] on 12/11/17.Marland KitchenJohny Chess

## 2017-12-08 ENCOUNTER — Other Ambulatory Visit (HOSPITAL_COMMUNITY): Payer: Self-pay | Admitting: *Deleted

## 2017-12-08 DIAGNOSIS — I5043 Acute on chronic combined systolic (congestive) and diastolic (congestive) heart failure: Secondary | ICD-10-CM

## 2017-12-08 DIAGNOSIS — T148XXD Other injury of unspecified body region, subsequent encounter: Secondary | ICD-10-CM

## 2017-12-08 DIAGNOSIS — Z7901 Long term (current) use of anticoagulants: Secondary | ICD-10-CM

## 2017-12-08 DIAGNOSIS — Z95811 Presence of heart assist device: Secondary | ICD-10-CM

## 2017-12-08 NOTE — Addendum Note (Signed)
Addended by: [name] B on: 12/08/2017 04:00 PM   Modules accepted: Orders

## 2017-12-09 ENCOUNTER — Encounter: Payer: Self-pay | Admitting: Hematology and Oncology

## 2017-12-09 ENCOUNTER — Telehealth: Payer: Self-pay | Admitting: Hematology and Oncology

## 2017-12-09 NOTE — Telephone Encounter (Signed)
Received a msg from May to schedule an appt for the pt to see [name] on 2/13 at 215pm. Pt aware to arrive 30 minutes early. Letter and directions mailed.

## 2017-12-11 ENCOUNTER — Ambulatory Visit (HOSPITAL_COMMUNITY): Payer: Self-pay | Admitting: Pharmacist

## 2017-12-11 ENCOUNTER — Telehealth: Payer: Self-pay | Admitting: Infectious Diseases

## 2017-12-11 ENCOUNTER — Ambulatory Visit (HOSPITAL_COMMUNITY)
Admission: RE | Admit: 2017-12-11 | Discharge: 2017-12-11 | Disposition: A | Discharge status: Home / Self Care | Payer: Medicare Other | Source: Ambulatory Visit | Attending: Internal Medicine | Admitting: Internal Medicine

## 2017-12-11 DIAGNOSIS — C4492 Squamous cell carcinoma of skin, unspecified: Secondary | ICD-10-CM | POA: Insufficient documentation

## 2017-12-11 DIAGNOSIS — T148XXD Other injury of unspecified body region, subsequent encounter: Secondary | ICD-10-CM | POA: Diagnosis not present

## 2017-12-11 DIAGNOSIS — I5043 Acute on chronic combined systolic (congestive) and diastolic (congestive) heart failure: Secondary | ICD-10-CM | POA: Diagnosis not present

## 2017-12-11 DIAGNOSIS — D638 Anemia in other chronic diseases classified elsewhere: Secondary | ICD-10-CM | POA: Insufficient documentation

## 2017-12-11 DIAGNOSIS — I482 Chronic atrial fibrillation: Secondary | ICD-10-CM | POA: Insufficient documentation

## 2017-12-11 DIAGNOSIS — Z95811 Presence of heart assist device: Secondary | ICD-10-CM | POA: Diagnosis not present

## 2017-12-11 DIAGNOSIS — E1122 Type 2 diabetes mellitus with diabetic chronic kidney disease: Secondary | ICD-10-CM | POA: Insufficient documentation

## 2017-12-11 DIAGNOSIS — I5022 Chronic systolic (congestive) heart failure: Secondary | ICD-10-CM | POA: Diagnosis not present

## 2017-12-11 DIAGNOSIS — Z992 Dependence on renal dialysis: Secondary | ICD-10-CM | POA: Diagnosis not present

## 2017-12-11 DIAGNOSIS — L03115 Cellulitis of right lower limb: Secondary | ICD-10-CM | POA: Diagnosis not present

## 2017-12-11 DIAGNOSIS — Z794 Long term (current) use of insulin: Secondary | ICD-10-CM | POA: Diagnosis not present

## 2017-12-11 DIAGNOSIS — I2510 Atherosclerotic heart disease of native coronary artery without angina pectoris: Secondary | ICD-10-CM | POA: Diagnosis not present

## 2017-12-11 DIAGNOSIS — Z7901 Long term (current) use of anticoagulants: Secondary | ICD-10-CM | POA: Diagnosis not present

## 2017-12-11 DIAGNOSIS — Z951 Presence of aortocoronary bypass graft: Secondary | ICD-10-CM | POA: Insufficient documentation

## 2017-12-11 DIAGNOSIS — Z79899 Other long term (current) drug therapy: Secondary | ICD-10-CM | POA: Diagnosis not present

## 2017-12-11 DIAGNOSIS — G4733 Obstructive sleep apnea (adult) (pediatric): Secondary | ICD-10-CM | POA: Insufficient documentation

## 2017-12-11 DIAGNOSIS — N186 End stage renal disease: Secondary | ICD-10-CM | POA: Diagnosis not present

## 2017-12-11 DIAGNOSIS — I132 Hypertensive heart and chronic kidney disease with heart failure and with stage 5 chronic kidney disease, or end stage renal disease: Secondary | ICD-10-CM | POA: Diagnosis not present

## 2017-12-11 DIAGNOSIS — I255 Ischemic cardiomyopathy: Secondary | ICD-10-CM | POA: Insufficient documentation

## 2017-12-11 DIAGNOSIS — Z4502 Encounter for adjustment and management of automatic implantable cardiac defibrillator: Secondary | ICD-10-CM | POA: Diagnosis not present

## 2017-12-11 LAB — COMPREHENSIVE METABOLIC PANEL
ALBUMIN: 3.5 g/dL (ref 3.5–5.0)
ALT: 20 U/L (ref 17–63)
AST: 29 U/L (ref 15–41)
Alkaline Phosphatase: 82 U/L (ref 38–126)
Anion gap: 13 (ref 5–15)
BUN: 16 mg/dL (ref 6–20)
CALCIUM: 8.5 mg/dL — AB (ref 8.9–10.3)
CO2: 27 mmol/L (ref 22–32)
CREATININE: 3.77 mg/dL — AB (ref 0.61–1.24)
Chloride: 93 mmol/L — ABNORMAL LOW (ref 101–111)
GFR calc Af Amer: 17 mL/min — ABNORMAL LOW (ref >60)
GFR calc non Af Amer: 15 mL/min — ABNORMAL LOW (ref >60)
GLUCOSE: 212 mg/dL — AB (ref 65–99)
Potassium: 4.1 mmol/L (ref 3.5–5.1)
SODIUM: 133 mmol/L — AB (ref 135–145)
Total Bilirubin: 1.2 mg/dL (ref 0.3–1.2)
Total Protein: 6.9 g/dL (ref 6.5–8.1)

## 2017-12-11 LAB — PROTIME-INR
INR: 2.13
Prothrombin Time: 23.7 seconds — ABNORMAL HIGH (ref 11.4–15.2)

## 2017-12-11 LAB — CBC
HCT: 31.5 % — ABNORMAL LOW (ref 39.0–52.0)
HEMOGLOBIN: 9.8 g/dL — AB (ref 13.0–17.0)
MCH: 30.3 pg (ref 26.0–34.0)
MCHC: 31.1 g/dL (ref 30.0–36.0)
MCV: 97.5 fL (ref 78.0–100.0)
Platelets: 167 10*3/uL (ref 150–400)
RBC: 3.23 MIL/uL — AB (ref 4.22–5.81)
RDW: 19.4 % — ABNORMAL HIGH (ref 11.5–15.5)
WBC: 10 10*3/uL (ref 4.0–10.5)

## 2017-12-11 LAB — PREALBUMIN: PREALBUMIN: 22 mg/dL (ref 18–38)

## 2017-12-11 LAB — LACTATE DEHYDROGENASE: LDH: 299 U/L — ABNORMAL HIGH (ref 98–192)

## 2017-12-11 NOTE — Progress Notes (Addendum)
Patient presents for hospital d/c follow up in [facility] today. Reports no problems with VAD equipment or concerns with drive line.   States he is doing his own leg dressing change and site "looks better". Pt denies any fever or chills.   Pt reports Dr. Lorrene Reid (Nephrology) would like to continue IV Vanc for a few more dialysis treatments.   Leg dressing changed. Updated [name], ID and [name] - both agree to continue IV vanc per Dr. Sanda Klein recommendations.      Vital Signs:  Doppler Pressure 108  Automatc BP:  117/77 (81) HR:  87 SPO2:  Did not pick up  Weight: 181.6 lb w/o eqt Last weight: 179.6 lb  VAD Indication: Destination Therapy age excluding    VAD interrogation & Equipment Management (reviewed with [name]): Speed:9600 Flow: 5.6 Power: 6.3w    PI: 3.6  Alarms: no clinical alarms Events: 70 PI events on 12/10/17 (dialysis day)  Fixed speed 9600 Low speed limit: 9000  Primary Controller:  Replace back up battery in 11 months. Back up controller:   Replace back up battery in 11 months.  Annual Equipment Maintenance on UBC/PM was performed on today.   I reviewed the LVAD parameters from today and compared the results to the patient's prior recorded data. LVAD interrogation was NEGATIVE for significant power changes, NEGATIVE for clinical alarms and STABLE for PI events/speed drops. No programming changes were made and pump is functioning within specified parameters. Pt is performing daily controller and system monitor self tests along with completing weekly and monthly maintenance for LVAD equipment.  LVAD equipment check completed and is in good working order. Back-up equipment present. Charged back up battery and performed self-test on equipment.   Exit Site Care: Drive line is being maintained weekly  by VAD Coordinators. Sorbaview dressing removed, dressing change per sterile technique. Drive line exit site well healed and incorporated. The  velour is fully implanted at exit site. No erythema, tenderness, drainage, or rash noted.  Stabilization device present and accurately applied. Pt denies fever or chills.   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 Margins not clear at neck.   Device:Medtronic single lead Therapies: on at 231 bpm Last check:07/31/2017  BP & Labs:  Doppler 108 - reflecting modified systolic  Hgb 9.8 - No S/S of bleeding. Specifically denies melena/BRBPR or nosebleeds.  LDH stable at 299 with established baseline of 225- 300. Denies tea-colored urine. No power elevations noted on interrogation.   1 year Intermacs follow up completed including:  Quality of Life, KCCQ-12, and Neurocognitive trail making.   Pt did not attempt 6 minute walk due to right leg healing incision.   Back up controller: 11V backup battery charged during this visit.   Patient Instructions: 1. No change in meds. 2. Return in one week for dressing change and INR. 3. Return to [facility] in 2 months.  [name] RN VAD Coordinator   Office: [phone] 24/7 Emergency VAD Pager: [phone]    Trevor [name] Murphyis a 72 y.o.malewith a history of CAD s/p CABG x 4 2010, OSA, AS with TAVR 2015, CKD, DM2, paroxysmal atrial fibrillation, asthma, and ischemic cardiomyopathy with Medtronic ICD.   Relocated to [place] from [place] in early 2018. Over the last couple of years, he had been admitted multiple times for CHF. Last admission in [place] was in  1/18. Sounds like the hospitalization was complicated by cardiorenal syndrome and there was consideration of doing dialysis.  He was admitted in to [facility] 11/27/16 with NYHA class IV symptoms and hypotension. He was placed on dual inotropes to facilitate diuresis, cardiac output and renal function.  CT surgery consulted for mechanical support  and he was deemed appropriate for LVAD work up. He completed LVAD work up and was approved for HMII LVAD --> DT. On 12/11/16, he had IABP placed to optimize cardiac output and improve renal function. He then underwent HMII LVAD placement + tricuspid valve repair on 12/14/2015. Post operatively pressors weaned off slowly. He required significant diuresis.  Course was complicated by RV dysfunction and renal dysfunction.  Patient was admitted with lower GI bleeding in 4/18.  He had been on ASA 325 (increased with mild LDH elevation) and warfarin INR 2-2.5.  He had 4 units PRBCs total.  Colonoscopy showed colonic AVM treated with APC and clipping.  He was sent home off ASA and on warfarin INR goal 2-2.5.   In 5/18, creatinine was noted to increase as high as 3.67.  I cut back on his diuretics but he became volume overloaded.  I increased diuretics again with poor response.  In 5/18, he had RHC showing elevated right and left heart filling pressures and preserved cardiac output. I admitted him for IV diuresis.  Weight and creatinine came down, creatinine was 2.9 when discharged.  Ramp echo was done and speed was increased to 9200 rpm.  Sildenafil was increased to 40 mg tid.  At the next visit, speed was increased to 9400 rpm and torsemide was decreased to 40 mg daily, later increased back to 60 mg daily with increased volume.   He continued to have problems with up and down renal function, requiring up and down adjustment of torsemide.  He finally was admitted to the hospital in 8/18 with intractable volume overload.  Efforts at diuresis were complicated by AKI.  Speed was increased to 9600 rpm in the hospital. He finally had to undergo CVVH, then was started on intermittent HD.  He was discharged home to continue on HD.    He was admitted in 10/18 for inpatient HD due to excessive weight gain.  He is now going to HD 4 times/week.    In 1/19, he was admitted for skin cancer removal.  He had a squamous cell skin  cancer removed from his left neck, margins not clear.  He had a squamous cell skin cancer removed from the right leg, margins were clear.  This admission complicated by hematoma formation at neck site requiring evacuation.  His right leg site appeared to be infected and he is now getting vancomycin with HD.   He returns for followup of LVAD and ESRD today.  He has been coughing over the last few days, thinks he has a cold.  His right leg site has decreased redness and drainage.  No lightheadedness.  He can walk on flat ground without dyspnea. Short of breath with stairs.  Device interrogation shows multiple PI events on HD days (this is chronic).    Labs (3/18): LDH 331 => 401 => 443 => 366 => 313, K 3.6 => 3.2 => 3.5, creatinine 1.98 => 1.74 => 1.8 => 1.79, hgb 8.3 => 8.9 => 8.7 => 8.4, INR 2.11 => 2.4 Labs (4/18): hgb 9.4 Labs (5/18): LDH 269 => 249, INR 2.57, K 4.5, creatinine 1.92 => 2.71 => 3.67 => 3.43 => 3.1 =>  2.93, hgb 9, plts 104 Labs (6/18): K 4.5, creatinine 3.6 => 3.43, hgb 8.5, INR 2.4, LDH 287\ Labs (7/18): K 4.5, creatinine 3.52 => 4, hgb 7.9 => 8.5 Labs (8/18): K 3.8, creatinine 4, hgb 8.5 Labs (9/18): hgb 8.2 => 9 => 9.9 => 10.6, plts 143, LDH 205 => 251  LVAD parameters: See nurse's note above.  I reviewed.   PMH: 1. CAD: s/p CABG in 2010.  2. Aortic stenosis: s/p TAVR in 2015. Valve looked ok on 2/18 echo.  3. Atrial fibrillation: Chronic.  4. Type II diabetes.  5. ESRD  6. Chronic systolic CHF: Ischemic cardiomyopathy with prominent RV dysfunction.  Medtronic ICD.  - Echo (2/18): EF 20-25%, moderately dilated LV, moderately dilated/moderately dysfunctional RV, severe TR.  - Cardiogenic shock 2/18 requiring milrinone, norepinephrine, and IABP.  - Heartmate II LVAD for DT placed 12/13/16.   - RHC (5/18): mean RA 16, PA 55/22 mean 33, mean PCWP 23, CI 2.59, PVR 3.87 - RHC (8/18): mean RA 17, PA 56/25 mean 37, mean PCWP 20, CI 3.2 Fick/2.1 thermo 7. OSA.  8.  Thrombocytopenia: Post-op LVAD, resolved.  9. Tricuspid regurgitation: Severe, s/p repair with LVAD placement in 2/18.  10. Lower GI bleed (4/18): Colonic AVM, treated with APC and clipping.  11. Psoriasis 12. Squamous cell skin cancer: left neck, right leg => removed 1/19.  Margins not clear at neck.   Social History   Socioeconomic History  . Marital status: Widowed    Spouse name: Not on file  . Number of children: 0  . Years of education: 60  . Highest education level: Not on file  Social Needs  . Financial resource strain: Not on file  . Food insecurity - worry: Not on file  . Food insecurity - inability: Not on file  . Transportation needs - medical: Not on file  . Transportation needs - non-medical: Not on file  Occupational History  . Not on file  Tobacco Use  . Smoking status: Never Smoker  . Smokeless tobacco: Never Used  Substance and Sexual Activity  . Alcohol use: No  . Drug use: No  . Sexual activity: Not Currently  Other Topics Concern  . Not on file  Social History Narrative   Patient is a widow. Moved to [place] from [place], [place]. Currently lives with his sister Tali Coster.    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    Family History  Problem Relation Age of Onset  . Heart failure Father   . Heart attack Father   . Healthy Mother   . Diabetes Paternal Grandfather    ROS: All systems reviewed and negative except as per HPI.   Current Outpatient Medications  Medication Sig Dispense Refill  . albuterol (PROVENTIL HFA;VENTOLIN HFA) 108 (90 Base) MCG/ACT inhaler Inhale 2 puffs into the lungs every 4 (four) hours as needed for wheezing or shortness of breath. 1 Inhaler 5  . albuterol (PROVENTIL) (2.5 MG/3ML) 0.083% nebulizer solution Take 3 mLs (2.5 mg total) by nebulization every 4 (four) hours. And as needed (Patient taking differently: Take 2.5 mg by nebulization every 4 (four) hours as needed for wheezing or shortness of breath. ) 150  mL 5  . atorvastatin (LIPITOR) 40 MG tablet TAKE 1 TABLET(40 MG) BY MOUTH DAILY (Patient taking differently: Take 40 mg by mouth daily at 6 PM. TAKE 1 TABLET(40 MG) BY MOUTH DAILY) 30 tablet 11  . busPIRone (BUSPAR) 5 MG tablet Take 1 tablet (5  mg total) by mouth 2 (two) times daily. 60 tablet 6  . calcitRIOL (ROCALTROL) 0.5 MCG capsule Take 1 capsule (0.5 mcg total) by mouth every other day. (Patient taking differently: Take 0.5 mcg by mouth every Monday, Wednesday, and Friday with hemodialysis. ) 15 capsule 6  . docusate sodium (COLACE) 100 MG capsule Take 1 capsule (100 mg total) by mouth 2 (two) times daily. (Patient taking differently: Take 100 mg daily by mouth. ) 60 capsule 12  . fluticasone (FLOVENT HFA) 110 MCG/ACT inhaler Inhale 2 puffs into the lungs 2 (two) times daily. 1 Inhaler 5  . gabapentin (NEURONTIN) 600 MG tablet Take 0.5 tablets (300 mg total) by mouth 2 (two) times daily.    Marland Kitchen guaiFENesin (MUCINEX) 600 MG 12 hr tablet Take 1,200 mg 2 (two) times daily as needed by mouth for cough.    . insulin glargine (LANTUS) 100 unit/mL SOPN Inject 0.2 mLs (20 Units total) into the skin at bedtime. (Patient taking differently: Inject 25 Units into the skin at bedtime. ) 15 mL 0  . ferric citrate (AURYXIA) 1 GM 210 MG(Fe) tablet Take 420 mg by mouth 3 (three) times daily with meals.    Marland Kitchen levothyroxine (SYNTHROID, LEVOTHROID) 25 MCG tablet Take 1 tablet (25 mcg total) by mouth daily before breakfast. 90 tablet 3  . multivitamin (RENA-VIT) TABS tablet Take 1 tablet by mouth at bedtime. 30 tablet 6  . oxyCODONE (OXY IR/ROXICODONE) 5 MG immediate release tablet Take 1 tablet (5 mg total) by mouth every 8 (eight) hours as needed for moderate pain or severe pain (if Ultram not effective). 6 tablet 0  . pantoprazole (PROTONIX) 40 MG tablet Take 1 tablet (40 mg total) by mouth daily. 30 tablet 5  . sildenafil (REVATIO) 20 MG tablet Take 2 tablets (40 mg total) 3 (three) times daily by mouth. 180 tablet  6  . traMADol (ULTRAM) 50 MG tablet TAKE 1 TABLET BY MOUTH EVERY 6 HOURS AS NEEDED FOR MODERATE PAIN 30 tablet 3  . traZODone (DESYREL) 100 MG tablet Take 1 tablet (100 mg total) by mouth at bedtime as needed for sleep. 30 tablet 3  . vancomycin (VANCOCIN) 1-5 GM/200ML-% SOLN Inject 200 mLs (1,000 mg total) into the vein every Monday, Wednesday, and Friday with hemodialysis. 4000 mL 1  . vancomycin (VANCOCIN) 1-5 GM/200ML-% SOLN Inject 200 mLs (1,000 mg total) into the vein every Saturday with hemodialysis. 4000 mL 0  . warfarin (COUMADIN) 5 MG tablet Take 5 mg 12/04/17  then back to 7.5 mg daily 45 tablet 6   Current Facility-Administered Medications  Medication Dose Route Frequency Provider Last Rate Last Dose  . octreotide (SANDOSTATIN LAR) IM injection 10 mg  10 mg Intramuscular Q30 days Nicolsen, Erika K, RPH-CPP   10 mg at 10/21/17 1000   BP (!) 108/0 Comment: Doppler modified systolic  Pulse 87   Ht 5\' 5"  (1.651 m)   Wt 181 lb 9.6 oz (82.4 kg)   BMI 30.22 kg/m   MAP 80 GENERAL: Well appearing this am. NAD.  HEENT: Normal. NECK: Supple, JVP 7-8 cm. Carotids OK.  Surgical site on left healing.  CARDIAC:  Mechanical heart sounds with LVAD hum present.  LUNGS:  CTAB, normal effort.  ABDOMEN:  NT, ND, no HSM. No bruits or masses. +BS  LVAD exit site: Well-healed and incorporated. Dressing dry and intact. No erythema or drainage. Stabilization device present and accurately applied. Driveline dressing changed daily per sterile technique. EXTREMITIES:  Warm and dry. No  cyanosis, clubbing or edema.  Right leg wrapped still.  NEUROLOGIC:  Alert & oriented x 3. Cranial nerves grossly intact. Moves all 4 extremities w/o difficulty. Affect pleasant       Assessment/Plan: 1. Chronic systolic CHF: Ischemic cardiomyopathy, EF 20-25% with RV dysfunction on 2/18 echo pre-LVAD.  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  Medtronic ICD.  Overall, he has felt much better post-LVAD.  He has had problems with RV  failure post-op.  He developed worsening renal dysfunction and finally had to start HD in 8/18.  There has been some difficulty getting adequate fluid off him, currently dialyzing 4 days/week.  Volume looks ok today.  - Continue warfarin with INR goal 2-2.5, no ASA.   2. CAD: s/p CABG.  No chest pain.  He is on atorvastatin.  3. ESRD: Tolerating HD, now able to use fistula.  4. Atrial fibrillation: Chronic.  On warfarin.  5. 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   6. HTN: MAP controlled.   7. 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 Hemoglobin stable.  8. 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  - Continue octreotide injections.  9. Squamous cell skin cancer: Neck excision site without clear margins.  He is going to see an oncologist next week.  10. H/o TAVR: Stable on last echo.  11. Cellulitis right leg: Improving, he is getting vancomycin with HD.   Loralie Champagne 12/12/2017

## 2017-12-11 NOTE — Telephone Encounter (Signed)
Patient with improvement but still redness/pain at cellulitis/surgical incision.   Can you help me call dialysis center to extend out the vancomycin another week through February 16th? He gets M-W-F-Sat sessions.   Thank you Cassie.

## 2017-12-12 ENCOUNTER — Other Ambulatory Visit (HOSPITAL_COMMUNITY): Payer: Self-pay | Admitting: Unknown Physician Specialty

## 2017-12-12 ENCOUNTER — Other Ambulatory Visit: Payer: Self-pay | Admitting: Pharmacist

## 2017-12-12 ENCOUNTER — Telehealth: Payer: Self-pay | Admitting: Pharmacist

## 2017-12-12 DIAGNOSIS — Z95811 Presence of heart assist device: Secondary | ICD-10-CM

## 2017-12-12 DIAGNOSIS — Z7901 Long term (current) use of anticoagulants: Secondary | ICD-10-CM

## 2017-12-12 NOTE — Telephone Encounter (Signed)
Called and spoke to nurse at [facility] on [address] to extended patient's vancomycin with HD until Feb 16th.  Also faxed order to [phone].

## 2017-12-12 NOTE — Telephone Encounter (Signed)
Thank you :)

## 2017-12-17 ENCOUNTER — Inpatient Hospital Stay: Payer: Medicare Other | Attending: Hematology and Oncology | Admitting: Hematology and Oncology

## 2017-12-17 VITALS — BP 99/74 | HR 55 | Temp 97.4°F | Resp 17 | Ht 65.0 in | Wt 175.9 lb

## 2017-12-17 DIAGNOSIS — I482 Chronic atrial fibrillation: Secondary | ICD-10-CM | POA: Diagnosis not present

## 2017-12-17 DIAGNOSIS — Z95810 Presence of automatic (implantable) cardiac defibrillator: Secondary | ICD-10-CM | POA: Diagnosis not present

## 2017-12-17 DIAGNOSIS — L7622 Postprocedural hemorrhage and hematoma of skin and subcutaneous tissue following other procedure: Secondary | ICD-10-CM

## 2017-12-17 DIAGNOSIS — N186 End stage renal disease: Secondary | ICD-10-CM

## 2017-12-17 DIAGNOSIS — C4442 Squamous cell carcinoma of skin of scalp and neck: Secondary | ICD-10-CM | POA: Insufficient documentation

## 2017-12-17 DIAGNOSIS — G4730 Sleep apnea, unspecified: Secondary | ICD-10-CM | POA: Diagnosis not present

## 2017-12-17 DIAGNOSIS — Z794 Long term (current) use of insulin: Secondary | ICD-10-CM

## 2017-12-17 DIAGNOSIS — Z992 Dependence on renal dialysis: Secondary | ICD-10-CM | POA: Diagnosis not present

## 2017-12-17 DIAGNOSIS — Z79899 Other long term (current) drug therapy: Secondary | ICD-10-CM | POA: Insufficient documentation

## 2017-12-17 DIAGNOSIS — I509 Heart failure, unspecified: Secondary | ICD-10-CM | POA: Diagnosis not present

## 2017-12-17 DIAGNOSIS — Z7901 Long term (current) use of anticoagulants: Secondary | ICD-10-CM

## 2017-12-17 DIAGNOSIS — C4490 Unspecified malignant neoplasm of skin, unspecified: Secondary | ICD-10-CM

## 2017-12-17 DIAGNOSIS — Z950 Presence of cardiac pacemaker: Secondary | ICD-10-CM | POA: Insufficient documentation

## 2017-12-17 DIAGNOSIS — E1122 Type 2 diabetes mellitus with diabetic chronic kidney disease: Secondary | ICD-10-CM | POA: Diagnosis not present

## 2017-12-17 NOTE — Assessment & Plan Note (Signed)
11/20/2017 left neck: Residual squamous cell carcinoma, poorly differentiated, deep margin involved; left neck central deep margin: Poorly differentiated squamous cell carcinoma; right leg skin: Well-differentiated squamous cell carcinoma, margins free.  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 I discussed with the patient that a positive deep margin is a concern. Given his difficulty with surgery, I agree with [name] that no additional surgery is necessary.  I will refer him to radiation oncology to see [name] to see if he would benefit from radiation therapy to the local area.  Since there is no role of systemic therapy, there is no patient does not need to return back to see me.

## 2017-12-17 NOTE — Progress Notes (Addendum)
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 male is here because of recent diagnosis of squamous cell carcinoma of the neck.  Patient had 3 areas recently excised to in the neck and one on the leg.  The one on the leg is a well-differentiated squamous cell carcinoma that margins were free.  For the neck squamous cell carcinoma, it appears that the initial deep margin was positive but the final margin was negative although the extent of the margin may be  less than 1 cm.  The patient was referred to [place] to discuss treatment options which include additional surgery which will be very difficult to perform given the poor wound healing as well as a bleeding postoperatively.  Second option would be to do radiation therapy.  Third option would be to observe this.  I reviewed her records extensively and collaborated the history with the patient.  SUMMARY OF ONCOLOGIC HISTORY:   Skin cancer   11/20/2017 Initial Diagnosis    Left neck: Residual squamous cell carcinoma, poorly differentiated, deep margin involved; left neck central deep margin: Poorly differentiated squamous cell carcinoma; right leg skin: Well-differentiated squamous cell carcinoma, margins free       MEDICAL HISTORY:  Past Medical History:  Diagnosis Date  . AICD (automatic cardioverter/defibrillator) present   . Asthma   . AVM (arteriovenous malformation) of colon 07/07/2017  . CHF (congestive heart failure) ([place])   . Diabetes ([place])   . ESRF (end stage renal failure) ([place]) 07/07/2017  . Kidney disease   . Permanent atrial fibrillation ([place]) 07/07/2017  . Presence of permanent cardiac pacemaker    AICD  . Sleep apnea     SURGICAL HISTORY: Past Surgical History:  Procedure Laterality Date  . APPLICATION OF A-CELL OF  EXTREMITY Left 11/20/2017   Procedure: APPLICATION OF A-CELL;  Surgeon: Wallace Going, DO;  Location: [facility];  Service: Plastics;  Laterality: Left;  . AV FISTULA PLACEMENT Left 06/30/2017   Procedure: CREATION of LEFT ARM Brachiocephalic Fistula;  Surgeon: Conrad [place], MD;  Location: [facility];  Service: Vascular;  Laterality: Left;  . CARDIAC CATHETERIZATION N/A 12/02/2016   Procedure: Right Heart Cath;  Surgeon: Larey Dresser, MD;  Location: [facility];  Service: Cardiovascular;  Laterality: N/A;  . COLONOSCOPY N/A 02/14/2017   Procedure: COLONOSCOPY;  Surgeon: Milus Banister, MD;  Location: [facility];  Service: Endoscopy;  Laterality: N/A;  . DIALYSIS/PERMA CATHETER INSERTION  06/30/2017   Procedure: INSERTION Dialysis Catheter;  Surgeon: Conrad Alden, MD;  Location: [facility];  Service: Vascular;;  . IABP INSERTION N/A 12/11/2016   Procedure: IABP Insertion;  Surgeon: Larey Dresser, MD;  Location: [facility];  Service: Cardiovascular;  Laterality: N/A;  . INSERTION OF IMPLANTABLE LEFT VENTRICULAR ASSIST DEVICE N/A 12/13/2016   Procedure: INSERTION OF IMPLANTABLE LEFT VENTRICULAR ASSIST DEVICE;  Surgeon: Ivin Poot, MD;  Location: [facility];  Service: Open Heart Surgery;  Laterality: N/A;  HEARTMATE II  NITRIC OXIDE  . IR FLUORO GUIDE CV LINE LEFT  06/17/2017  . IR US GUIDE VASC ACCESS LEFT  06/17/2017  . MASS EXCISION Left 11/20/2017   Procedure: EXCISION OF LEFT NECK AND RIGHT LEG SKIN CANCER WITH A CELL PLACEMENT;  Surgeon: Wallace Going, DO;  Location:  [facility];  Service: Plastics;  Laterality: Left;  Marland Kitchen MASS EXCISION Left 11/26/2017   Procedure: EXPLORATION AND EXCISION OF LEFT NECK;  Surgeon: Wallace Going, DO;  Location: [facility];  Service: Plastics;  Laterality: Left;  . RIGHT HEART CATH N/A 12/11/2016   Procedure: Right Heart Cath;  Surgeon: Larey Dresser, MD;  Location: [facility];  Service: Cardiovascular;  Laterality: N/A;  . RIGHT HEART CATH N/A  03/27/2017   Procedure: Right Heart Cath;  Surgeon: Larey Dresser, MD;  Location: [facility];  Service: Cardiovascular;  Laterality: N/A;  . RIGHT HEART CATH N/A 06/13/2017   Procedure: RIGHT HEART CATH;  Surgeon: Jolaine Artist, MD;  Location: [facility];  Service: Cardiovascular;  Laterality: N/A;  . TEE WITHOUT CARDIOVERSION N/A 12/13/2016   Procedure: TRANSESOPHAGEAL ECHOCARDIOGRAM (TEE);  Surgeon: Ivin Poot, MD;  Location: [facility];  Service: Open Heart Surgery;  Laterality: N/A;  . TRICUSPID VALVE REPLACEMENT N/A 12/13/2016   Procedure: TRICUSPID VALVE REPAIR WITH EDWARDS MC 3 TRICUSPID ANNULOPLASTY RING MODEL 4900 SIZE T 28;  Surgeon: Ivin Poot, MD;  Location: [facility];  Service: Open Heart Surgery;  Laterality: N/A;    SOCIAL HISTORY: Social History   Socioeconomic History  . Marital status: Widowed    Spouse name: Not on file  . Number of children: 0  . Years of education: 36  . Highest education level: Not on file  Social Needs  . Financial resource strain: Not on file  . Food insecurity - worry: Not on file  . Food insecurity - inability: Not on file  . Transportation needs - medical: Not on file  . Transportation needs - non-medical: Not on file  Occupational History  . Not on file  Tobacco Use  . Smoking status: Never Smoker  . Smokeless tobacco: Never Used  Substance and Sexual Activity  . Alcohol use: No  . Drug use: No  . Sexual activity: Not Currently  Other Topics Concern  . Not on file  Social History Narrative   Patient is a widow. Moved to [place] from [place], [place]. Currently lives with his sister Oluwatimileyin [place].    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     FAMILY HISTORY: Family History  Problem Relation Age of Onset  . Heart failure Father   . Heart attack Father   . Healthy Mother   . Diabetes Paternal Grandfather     ALLERGIES:  has no active allergies.  MEDICATIONS:  Current Outpatient  Medications  Medication Sig Dispense Refill  . albuterol (PROVENTIL HFA;VENTOLIN HFA) 108 (90 Base) MCG/ACT inhaler Inhale 2 puffs into the lungs every 4 (four) hours as needed for wheezing or shortness of breath. 1 Inhaler 5  . albuterol (PROVENTIL) (2.5 MG/3ML) 0.083% nebulizer solution Take 3 mLs (2.5 mg total) by nebulization every 4 (four) hours. And as needed (Patient taking differently: Take 2.5 mg by nebulization every 4 (four) hours as needed for wheezing or shortness of breath. ) 150 mL 5  . atorvastatin (LIPITOR) 40 MG tablet TAKE 1 TABLET(40 MG) BY MOUTH DAILY (Patient taking differently: Take 40 mg by mouth daily at 6 PM. TAKE 1 TABLET(40 MG) BY MOUTH DAILY) 30 tablet 11  . busPIRone (BUSPAR) 5 MG tablet Take 1 tablet (5 mg total) by mouth 2 (two) times daily. 60 tablet 6  . calcitRIOL (ROCALTROL) 0.5 MCG capsule Take 1 capsule (0.5 mcg total) by mouth every other day. (  Patient taking differently: Take 0.5 mcg by mouth every Monday, Wednesday, and Friday with hemodialysis. ) 15 capsule 6  . docusate sodium (COLACE) 100 MG capsule Take 1 capsule (100 mg total) by mouth 2 (two) times daily. (Patient taking differently: Take 100 mg daily by mouth. ) 60 capsule 12  . fluticasone (FLOVENT HFA) 110 MCG/ACT inhaler Inhale 2 puffs into the lungs 2 (two) times daily. 1 Inhaler 5  . gabapentin (NEURONTIN) 600 MG tablet Take 0.5 tablets (300 mg total) by mouth 2 (two) times daily.    . insulin glargine (LANTUS) 100 unit/mL SOPN Inject 0.2 mLs (20 Units total) into the skin at bedtime. (Patient taking differently: Inject 25 Units into the skin at bedtime. ) 15 mL 0  . levothyroxine (SYNTHROID, LEVOTHROID) 25 MCG tablet Take 1 tablet (25 mcg total) by mouth daily before breakfast. 90 tablet 3  . multivitamin (RENA-VIT) TABS tablet Take 1 tablet by mouth at bedtime. 30 tablet 6  . pantoprazole (PROTONIX) 40 MG tablet Take 1 tablet (40 mg total) by mouth daily. 30 tablet 5  . sildenafil (REVATIO) 20 MG  tablet Take 2 tablets (40 mg total) 3 (three) times daily by mouth. 180 tablet 6  . traMADol (ULTRAM) 50 MG tablet TAKE 1 TABLET BY MOUTH EVERY 6 HOURS AS NEEDED FOR MODERATE PAIN 30 tablet 3  . traZODone (DESYREL) 100 MG tablet Take 1 tablet (100 mg total) by mouth at bedtime as needed for sleep. 30 tablet 3  . vancomycin (VANCOCIN) 1-5 GM/200ML-% SOLN Inject 200 mLs (1,000 mg total) into the vein every Monday, Wednesday, and Friday with hemodialysis. 4000 mL 1  . vancomycin (VANCOCIN) 1-5 GM/200ML-% SOLN Inject 200 mLs (1,000 mg total) into the vein every Saturday with hemodialysis. 4000 mL 0  . warfarin (COUMADIN) 5 MG tablet Take 5 mg 12/04/17  then back to 7.5 mg daily 45 tablet 6   Current Facility-Administered Medications  Medication Dose Route Frequency Provider Last Rate Last Dose  . octreotide (SANDOSTATIN LAR) IM injection 10 mg  10 mg Intramuscular Q30 days Nicolsen, Erika K, RPH-CPP   10 mg at 10/21/17 1000    REVIEW OF SYSTEMS:   Constitutional: Denies fevers, chills or abnormal night sweats Eyes: Denies blurriness of vision, double vision or watery eyes Ears, nose, mouth, throat, and face: Denies mucositis or sore throat Respiratory: Denies cough, dyspnea or wheezes Cardiovascular: Congestive heart failure with LVAD Gastrointestinal:  Denies nausea, heartburn or change in bowel habits Skin: Denies abnormal skin rashes Lymphatics: Denies new lymphadenopathy or easy bruising Neurological:Denies numbness, tingling or new weaknesses Behavioral/Psych: Mood is stable, no new changes   All other systems were reviewed with the patient and are negative.  PHYSICAL EXAMINATION: ECOG PERFORMANCE STATUS: 1 - Symptomatic but completely ambulatory  Vitals:   12/17/17 1339  BP: 99/74  Pulse: (!) 55  Resp: 17  Temp: (!) 97.4 F (36.3 C)  SpO2: 98%   Filed Weights   12/17/17 1339  Weight: 175 lb 14.4 oz (79.8 kg)    GENERAL:alert, no distress and comfortable SKIN: Neck appears  to have healed finally. EYES: normal, conjunctiva are pink and non-injected, sclera clear OROPHARYNX:no exudate, no erythema and lips, buccal mucosa, and tongue normal  NECK: supple, thyroid normal size, non-tender, without nodularity LYMPH:  no palpable lymphadenopathy in the cervical, axillary or inguinal LUNGS: clear to auscultation and percussion with normal breathing effort HEART: regular rate & rhythm and no murmurs and no lower extremity edema ABDOMEN:abdomen soft, non-tender and  normal bowel sounds Musculoskeletal:no cyanosis of digits and no clubbing  PSYCH: alert & oriented x 3 with fluent speech NEURO: no focal motor/sensory deficits  LABORATORY DATA:  I have reviewed the data as listed Lab Results  Component Value Date   WBC 10.0 12/11/2017   HGB 9.8 (L) 12/11/2017   HCT 31.5 (L) 12/11/2017   MCV 97.5 12/11/2017   PLT 167 12/11/2017   Lab Results  Component Value Date   NA 133 (L) 12/11/2017   K 4.1 12/11/2017   CL 93 (L) 12/11/2017   CO2 27 12/11/2017    RADIOGRAPHIC STUDIES: I have personally reviewed the radiological reports and agreed with the findings in the report.  ASSESSMENT AND PLAN:  Skin cancer 11/20/2017 left neck: Residual squamous cell carcinoma, poorly differentiated, deep margin involved; left neck central deep margin: Poorly differentiated squamous cell carcinoma; it appears that the final margin is negative but less than 1 cm in size. right leg skin: Well-differentiated squamous cell carcinoma, margins free.  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 I discussed with the patient that a positive deep margin is a concern. Given his difficulty with surgery, I agree with [name] that no additional surgery is necessary.  I will refer him to radiation oncology to see [name] to see if he would benefit from radiation therapy to the local area. It is also  possible that he may be observed without any additional surgeries since the resection margins are negative although they are close.  Since there is no role of systemic therapy, there is no patient does not need to return back to see me.   All questions were answered. The patient knows to call the clinic with any problems, questions or concerns.    Harriette Ohara, MD 12/17/17

## 2017-12-18 ENCOUNTER — Ambulatory Visit (HOSPITAL_COMMUNITY)
Admission: RE | Admit: 2017-12-18 | Discharge: 2017-12-18 | Disposition: A | Discharge status: Home / Self Care | Payer: Medicare Other | Source: Ambulatory Visit | Attending: Internal Medicine | Admitting: Internal Medicine

## 2017-12-18 ENCOUNTER — Encounter: Payer: Self-pay | Admitting: Radiation Oncology

## 2017-12-18 ENCOUNTER — Ambulatory Visit (HOSPITAL_COMMUNITY): Payer: Self-pay | Admitting: Pharmacist

## 2017-12-18 ENCOUNTER — Encounter: Payer: Self-pay | Admitting: *Deleted

## 2017-12-18 DIAGNOSIS — Z7901 Long term (current) use of anticoagulants: Secondary | ICD-10-CM | POA: Diagnosis present

## 2017-12-18 DIAGNOSIS — E119 Type 2 diabetes mellitus without complications: Secondary | ICD-10-CM

## 2017-12-18 DIAGNOSIS — Z48812 Encounter for surgical aftercare following surgery on the circulatory system: Secondary | ICD-10-CM | POA: Insufficient documentation

## 2017-12-18 DIAGNOSIS — Z794 Long term (current) use of insulin: Secondary | ICD-10-CM

## 2017-12-18 DIAGNOSIS — Z95811 Presence of heart assist device: Secondary | ICD-10-CM | POA: Diagnosis present

## 2017-12-18 LAB — PROTIME-INR
INR: 2.01
Prothrombin Time: 22.6 seconds — ABNORMAL HIGH (ref 11.4–15.2)

## 2017-12-18 MED ORDER — INSULIN GLARGINE 100 UNITS/ML SOLOSTAR PEN: 25.0000 [IU] | PEN_INJECTOR | Freq: Every day | SUBCUTANEOUS | 1 refills | Status: DC

## 2017-12-18 MED ORDER — CITALOPRAM HYDROBROMIDE 20 MG PO TABS: 20.0000 mg | ORAL_TABLET | Freq: Every day | ORAL | 6 refills | Status: DC

## 2017-12-18 NOTE — Progress Notes (Signed)
Patient presents to clinic today for drive line exit wound care. Existing VAD dressing removed and site care performed using sterile technique. Drive line exit site cleaned with Chlora prep applicators x 2, allowed to dry, and Sorbaview dressing with bio patch re-applied. Exit site healed and incorporated, the velour is fully implanted at exit site. No redness, tenderness, drainage, foul odor or rash noted. Drive line anchor re-applied. Pt denies fever or chills.   Patient expresses feelings of depression and hopelessness. Feels overwhelmed with recent cancer diagnoses. Referral made for counseling. Kennyth Lose CSW in to see patient during this visit as well.   Return in 1 week for another dressing change per standard of care. INR to be repeated in 1-2 weeks pending results per anticoagulation protocol.   Balinda Quails RN, VAD Coordinator 24/7 pager [phone]

## 2017-12-18 NOTE — Progress Notes (Signed)
Oncology Nurse Navigator Documentation  Placed New Referral Introductory Call to patient, LVMM requesting call back.  Gayleen Orem, RN, BSN Head & Neck Oncology Nurse [place] at [place] [phone]

## 2017-12-18 NOTE — Progress Notes (Signed)
CSW referred to provide supportive counseling. Patient reports he is feeling increasingly more depressed as multiple life issues along with complicated medical issues appear to compound. Patient shared feelings and need to "talk through". Patient's wife passed away 3 years ago and "still having grief issues from her loss".  CSW provided supportive intervention and will meet with patient one on one for supportive counseling as needed. Patient appears to have some depressive symptoms. CSW continues to follow. Trevor [name], [place], [place]

## 2017-12-19 ENCOUNTER — Other Ambulatory Visit (HOSPITAL_COMMUNITY): Payer: Self-pay | Admitting: Unknown Physician Specialty

## 2017-12-19 DIAGNOSIS — Z7901 Long term (current) use of anticoagulants: Secondary | ICD-10-CM

## 2017-12-19 DIAGNOSIS — Z95811 Presence of heart assist device: Secondary | ICD-10-CM

## 2017-12-24 NOTE — Progress Notes (Signed)
Histology and Location of Primary Skin Cancer:  11/20/17 Diagnosis 1. Skin , Left neck RESIDUAL SQUAMOUS CELL CARCINOMA, POORLY DIFFERENTIATED, DEEP MARGIN INVOLVED, SEE DESCRIPTION 2. Skin , Left neck central deep margin DEEP MARGIN, RESIDUAL SQUAMOUS CELL CARCINOMA, POORLY DIFFERENTIATED, DEEP MARGIN FREE (0.1 TO 0.2 MM OF DEEP FATTY MARGIN), SEE DESCRIPTION 3. Skin , Right leg WELL DIFFERENTIATED SQUAMOUS CELL CARCINOMA, MARGINS FREE  Trevor [name] presented with the following signs/symptoms months ago:   Past/Anticipated interventions by patient's surgeon/dermatologist for current problematic lesion, if any:  11/20/17  PROCEDURE:  1. Excision of squamous cell carcinoma of left neck 5 x 7 cm with intermediate layered closure. 2. Excision of squamous cell carcinoma 2 x 2 cm deep margin. 3. Excision of squamous cell carcinoma of right leg 3 x 6 cm. 4. Placement of Acell to right leg 1 gm and 5 x 5 cm sheet. 5. Placement of Prevena VAC to right leg.  SURGEON: [place], DO   Medical Oncology: 12/17/17 Dr. Lindi [name]: ASSESSMENT AND PLAN:  Skin cancer 11/20/2017 left neck: Residual squamous cell carcinoma, poorly differentiated, deep margin involved; left neck central deep margin: Poorly differentiated squamous cell carcinoma; it appears that the final margin is negative but less than 1 cm in size. right leg skin: Well-differentiated squamous cell carcinoma, margins free.  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 I discussed with the patient that a positive deep margin is a concern. Given his difficulty with surgery, I agree with Dr. Marla [name] that no additional surgery is necessary.  I will refer him to radiation oncology to see Dr. Lisbeth [name] to see if he would benefit from radiation therapy to the local area. It is also possible that he may be observed without any additional  surgeries since the resection margins are negative although they are close.  Since there is no role of systemic therapy, there is no patient does not need to return back to see me.    SAFETY ISSUES:  Prior radiation? No  Pacemaker/ICD? Yes, pacemaker  Possible current pregnancy? N/A  Is the patient on methotrexate? No  Current Complaints / other details:   ESRD on Hemodialysis.   BP 107/83   Pulse 93   Temp 98.4 F (36.9 C)   Ht 5\' 5"  (1.651 m)   Wt 187 lb 9.6 oz (85.1 kg)   SpO2 97% Comment: room air  BMI 31.22 kg/m    Wt Readings from Last 3 Encounters:  12/25/17 187 lb 9.6 oz (85.1 kg)  12/17/17 175 lb 14.4 oz (79.8 kg)  12/11/17 181 lb 9.6 oz (82.4 kg)

## 2017-12-25 ENCOUNTER — Ambulatory Visit (HOSPITAL_COMMUNITY)
Admission: RE | Admit: 2017-12-25 | Discharge: 2017-12-25 | Disposition: A | Discharge status: Home / Self Care | Payer: Medicare Other | Source: Ambulatory Visit | Attending: Cardiology | Admitting: Cardiology

## 2017-12-25 ENCOUNTER — Ambulatory Visit
Admission: RE | Admit: 2017-12-25 | Discharge: 2017-12-25 | Disposition: A | Discharge status: Home / Self Care | Payer: Medicare Other | Source: Ambulatory Visit | Attending: Radiation Oncology | Admitting: Radiation Oncology

## 2017-12-25 ENCOUNTER — Ambulatory Visit (HOSPITAL_COMMUNITY): Payer: Self-pay | Admitting: Pharmacist

## 2017-12-25 ENCOUNTER — Encounter: Payer: Self-pay | Admitting: Licensed Clinical Social Worker

## 2017-12-25 ENCOUNTER — Encounter: Payer: Self-pay | Admitting: Radiation Oncology

## 2017-12-25 VITALS — BP 107/83 | HR 93 | Temp 98.4°F | Ht 65.0 in | Wt 187.6 lb

## 2017-12-25 DIAGNOSIS — Z794 Long term (current) use of insulin: Secondary | ICD-10-CM | POA: Diagnosis not present

## 2017-12-25 DIAGNOSIS — C4442 Squamous cell carcinoma of skin of scalp and neck: Secondary | ICD-10-CM

## 2017-12-25 DIAGNOSIS — I700 Atherosclerosis of aorta: Secondary | ICD-10-CM | POA: Diagnosis not present

## 2017-12-25 DIAGNOSIS — I130 Hypertensive heart and chronic kidney disease with heart failure and stage 1 through stage 4 chronic kidney disease, or unspecified chronic kidney disease: Secondary | ICD-10-CM | POA: Diagnosis not present

## 2017-12-25 DIAGNOSIS — I509 Heart failure, unspecified: Secondary | ICD-10-CM | POA: Diagnosis not present

## 2017-12-25 DIAGNOSIS — Z79899 Other long term (current) drug therapy: Secondary | ICD-10-CM | POA: Diagnosis not present

## 2017-12-25 DIAGNOSIS — E1122 Type 2 diabetes mellitus with diabetic chronic kidney disease: Secondary | ICD-10-CM | POA: Diagnosis not present

## 2017-12-25 DIAGNOSIS — R918 Other nonspecific abnormal finding of lung field: Secondary | ICD-10-CM | POA: Diagnosis not present

## 2017-12-25 DIAGNOSIS — N186 End stage renal disease: Secondary | ICD-10-CM | POA: Diagnosis not present

## 2017-12-25 DIAGNOSIS — J45909 Unspecified asthma, uncomplicated: Secondary | ICD-10-CM | POA: Insufficient documentation

## 2017-12-25 DIAGNOSIS — G4730 Sleep apnea, unspecified: Secondary | ICD-10-CM | POA: Insufficient documentation

## 2017-12-25 DIAGNOSIS — E041 Nontoxic single thyroid nodule: Secondary | ICD-10-CM | POA: Diagnosis not present

## 2017-12-25 DIAGNOSIS — Z7901 Long term (current) use of anticoagulants: Secondary | ICD-10-CM | POA: Insufficient documentation

## 2017-12-25 DIAGNOSIS — Z95811 Presence of heart assist device: Secondary | ICD-10-CM | POA: Diagnosis present

## 2017-12-25 DIAGNOSIS — Z992 Dependence on renal dialysis: Secondary | ICD-10-CM | POA: Insufficient documentation

## 2017-12-25 DIAGNOSIS — I482 Chronic atrial fibrillation: Secondary | ICD-10-CM | POA: Diagnosis not present

## 2017-12-25 DIAGNOSIS — I2510 Atherosclerotic heart disease of native coronary artery without angina pectoris: Secondary | ICD-10-CM | POA: Diagnosis not present

## 2017-12-25 HISTORY — DX: Presence of heart assist device: Z95.811

## 2017-12-25 LAB — PROTIME-INR
INR: 1.92
Prothrombin Time: 21.8 seconds — ABNORMAL HIGH (ref 11.4–15.2)

## 2017-12-25 NOTE — Addendum Note (Signed)
Encounter addended by: Candy Sledge, RN on: 12/25/2017 9:40 AM  Actions taken: Sign clinical note

## 2017-12-25 NOTE — Progress Notes (Signed)
CSW met with patient at his request for supportive counseling. Patient spoke at length about multiple losses from his wife to his health. Patient's wife passed away 4 years ago while living in [place]. Patient had a good support system in [place] and moved to [place] due to health issues one year ago. Over the past year he has had multiple ongoing health issues but acknowledges developing new support network through the LVAD support group which has helped at times. Patient states talking through helps and grateful for the HF team for the support. Patient has follow up with oncology and states he will return call to CSW for further supportive needs. CSW continues to follow for support and any other needs as identified. Raquel Sarna, [place], [place]

## 2017-12-25 NOTE — Progress Notes (Addendum)
Radiation Oncology         [phone] ________________________________  Name: Trevor [name]        MRN: [id number]  Date of Service: 12/25/2017 DOB: 1946/05/07  FA:[id number], Arvid Right, MD  Nicholas Lose, MD     REFERRING PHYSICIAN: Nicholas Lose, MD   DIAGNOSIS: The encounter diagnosis was Squamous cell skin cancer.   HISTORY OF PRESENT ILLNESS: Trevor [name] is a 72 y.o. male seen at the request of 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 He had a biopsy of the skin of the left neckon 05/26/17 and the right shin both revealing invasive squamous cell carcinoma. He underwent surgical excision on 11/20/17, and an right shin specimen showed a well-differentiated squamous cell carcinoma with negative margins. The neck resection revealed a squamous cell carcinoma and the deep margin was positive, but the final margin was negative, though within 2 mm. He was taken back to the OR on 11/26/17 for evacuation of a hematoma and to obtain additional tissue to document the deep margin. Unfortunately, due to the risk of bleeding, a specimen could not safely be obtained. His surgical site received primary closure. The patient was seen by [name] on 12/17/17, and given the limitations in re-excision, the patient has been referred today to discuss the possibility of radiation therapy to the local area. There does not appear to be an indication for systemic therapy at this time.   PREVIOUS RADIATION THERAPY: No   PAST MEDICAL HISTORY:  Past Medical History:  Diagnosis Date  . AICD (automatic cardioverter/defibrillator) present   . Asthma   . AVM (arteriovenous malformation) of colon 07/07/2017  . CHF (congestive heart failure) ([place])   . Diabetes ([place])   . ESRF (end stage renal failure) ([place]) 07/07/2017  . Kidney disease   . Permanent atrial fibrillation ([place]) 07/07/2017  . Presence of permanent cardiac pacemaker    AICD  . Sleep apnea       PAST SURGICAL HISTORY: Past Surgical History:  Procedure Laterality Date  . APPLICATION OF A-CELL OF EXTREMITY Left 11/20/2017   Procedure: APPLICATION OF A-CELL;  Surgeon: Wallace Going, DO;  Location: [facility];  Service: Plastics;  Laterality: Left;  . AV FISTULA PLACEMENT Left 06/30/2017   Procedure: CREATION of LEFT ARM Brachiocephalic Fistula;  Surgeon: Conrad Holtville, MD;  Location: [place];  Service: Vascular;  Laterality: Left;  . CARDIAC CATHETERIZATION N/A 12/02/2016   Procedure: Right Heart Cath;  Surgeon: Larey Dresser, MD;  Location: [facility];  Service: Cardiovascular;  Laterality: N/A;  . COLONOSCOPY N/A 02/14/2017   Procedure: COLONOSCOPY;  Surgeon: Milus Banister, MD;  Location: [place];  Service: Endoscopy;  Laterality: N/A;  . DIALYSIS/PERMA CATHETER INSERTION  06/30/2017   Procedure: INSERTION Dialysis Catheter;  Surgeon: Conrad Worthington, MD;  Location: [facility];  Service: Vascular;;  . IABP INSERTION N/A 12/11/2016   Procedure: IABP Insertion;  Surgeon: Larey Dresser, MD;  Location: [facility];  Service: Cardiovascular;  Laterality: N/A;  . INSERTION OF IMPLANTABLE LEFT VENTRICULAR ASSIST DEVICE N/A 12/13/2016   Procedure: INSERTION OF IMPLANTABLE LEFT VENTRICULAR ASSIST DEVICE;  Surgeon: Ivin Poot, MD;  Location: [facility];  Service: Open Heart Surgery;  Laterality: N/A;  HEARTMATE II  NITRIC OXIDE  . IR FLUORO GUIDE CV LINE LEFT  06/17/2017  . IR US GUIDE VASC ACCESS LEFT  06/17/2017  . MASS EXCISION  Left 11/20/2017   Procedure: EXCISION OF LEFT NECK AND RIGHT LEG SKIN CANCER WITH A CELL PLACEMENT;  Surgeon: Wallace Going, DO;  Location: [facility];  Service: Plastics;  Laterality: Left;  Marland Kitchen MASS EXCISION Left 11/26/2017   Procedure: EXPLORATION AND EXCISION OF LEFT NECK;  Surgeon: Wallace Going, DO;  Location: [facility];  Service: Plastics;  Laterality: Left;  . RIGHT HEART CATH N/A 12/11/2016   Procedure: Right Heart Cath;  Surgeon: Larey Dresser, MD;  Location: [facility];  Service: Cardiovascular;  Laterality: N/A;  . RIGHT HEART CATH N/A 03/27/2017   Procedure: Right Heart Cath;  Surgeon: Larey Dresser, MD;  Location: [facility];  Service: Cardiovascular;  Laterality: N/A;  . RIGHT HEART CATH N/A 06/13/2017   Procedure: RIGHT HEART CATH;  Surgeon: Jolaine Artist, MD;  Location: [facility];  Service: Cardiovascular;  Laterality: N/A;  . TEE WITHOUT CARDIOVERSION N/A 12/13/2016   Procedure: TRANSESOPHAGEAL ECHOCARDIOGRAM (TEE);  Surgeon: Ivin Poot, MD;  Location: [facility];  Service: Open Heart Surgery;  Laterality: N/A;  . TRICUSPID VALVE REPLACEMENT N/A 12/13/2016   Procedure: TRICUSPID VALVE REPAIR WITH EDWARDS MC 3 TRICUSPID ANNULOPLASTY RING MODEL 4900 SIZE T 28;  Surgeon: Ivin Poot, MD;  Location: [place];  Service: Open Heart Surgery;  Laterality: N/A;     FAMILY HISTORY:  Family History  Problem Relation Age of Onset  . Heart failure Father   . Heart attack Father   . Healthy Mother   . Diabetes Paternal Grandfather      SOCIAL HISTORY:  reports that  has never smoked. he has never used smokeless tobacco. He reports that he does not drink alcohol or use drugs. The patient is widowed. He lives in [place]. He has a sister who lives nearby.    ALLERGIES: Patient has no active allergies.   MEDICATIONS:  Current Outpatient Medications  Medication Sig Dispense Refill  . albuterol (PROVENTIL HFA;VENTOLIN HFA) 108 (90 Base) MCG/ACT inhaler Inhale 2 puffs into the lungs every 4 (four) hours as needed for wheezing or shortness of breath. 1 Inhaler 5  . albuterol (PROVENTIL) (2.5 MG/3ML) 0.083% nebulizer solution Take 3 mLs (2.5 mg total) by nebulization every 4 (four) hours. And as needed (Patient taking differently: Take 2.5 mg by nebulization every 4 (four) hours as needed for wheezing or shortness of breath. ) 150 mL 5  . atorvastatin (LIPITOR) 40 MG tablet TAKE 1 TABLET(40 MG) BY  MOUTH DAILY (Patient taking differently: Take 40 mg by mouth daily at 6 PM. TAKE 1 TABLET(40 MG) BY MOUTH DAILY) 30 tablet 11  . busPIRone (BUSPAR) 5 MG tablet Take 1 tablet (5 mg total) by mouth 2 (two) times daily. 60 tablet 6  . calcitRIOL (ROCALTROL) 0.5 MCG capsule Take 1 capsule (0.5 mcg total) by mouth every other day. (Patient taking differently: Take 0.5 mcg by mouth every Monday, Wednesday, and Friday with hemodialysis. ) 15 capsule 6  . citalopram (CELEXA) 20 MG tablet Take 1 tablet (20 mg total) by mouth daily. 90 tablet 6  . docusate sodium (COLACE) 100 MG capsule Take 1 capsule (100 mg total) by mouth 2 (two) times daily. (Patient taking differently: Take 100 mg daily by mouth. ) 60 capsule 12  . fluticasone (FLOVENT HFA) 110 MCG/ACT inhaler Inhale 2 puffs into the lungs 2 (two) times daily. 1 Inhaler 5  . gabapentin (NEURONTIN) 600 MG tablet Take 0.5 tablets (300 mg total) by  mouth 2 (two) times daily.    . insulin glargine (LANTUS) 100 unit/mL SOPN Inject 0.25 mLs (25 Units total) into the skin at bedtime. 1 pen 1  . levothyroxine (SYNTHROID, LEVOTHROID) 25 MCG tablet Take 1 tablet (25 mcg total) by mouth daily before breakfast. 90 tablet 3  . multivitamin (RENA-VIT) TABS tablet Take 1 tablet by mouth at bedtime. 30 tablet 6  . pantoprazole (PROTONIX) 40 MG tablet Take 1 tablet (40 mg total) by mouth daily. 30 tablet 5  . sildenafil (REVATIO) 20 MG tablet Take 2 tablets (40 mg total) 3 (three) times daily by mouth. 180 tablet 6  . traMADol (ULTRAM) 50 MG tablet TAKE 1 TABLET BY MOUTH EVERY 6 HOURS AS NEEDED FOR MODERATE PAIN 30 tablet 3  . traZODone (DESYREL) 100 MG tablet Take 1 tablet (100 mg total) by mouth at bedtime as needed for sleep. 30 tablet 3  . vancomycin (VANCOCIN) 1-5 GM/200ML-% SOLN Inject 200 mLs (1,000 mg total) into the vein every Monday, Wednesday, and Friday with hemodialysis. 4000 mL 1  . vancomycin (VANCOCIN) 1-5 GM/200ML-% SOLN Inject 200 mLs (1,000 mg total)  into the vein every Saturday with hemodialysis. 4000 mL 0  . warfarin (COUMADIN) 5 MG tablet Take 7.5 mg by mouth daily.     No current facility-administered medications for this encounter.      REVIEW OF SYSTEMS: On review of systems, the patient reports that he is doing well overall. He denies any chest pain, shortness of breath, cough, fevers, chills, night sweats, unintended weight changes. He denies any bowel or bladder disturbances, and denies abdominal pain, nausea or vomiting. He denies any new musculoskeletal or joint aches or pains. A complete review of systems is obtained and is otherwise negative.     PHYSICAL EXAM:  Wt Readings from Last 3 Encounters:  12/17/17 175 lb 14.4 oz (79.8 kg)  12/11/17 181 lb 9.6 oz (82.4 kg)  12/03/17 183 lb 13.8 oz (83.4 kg)   Temp Readings from Last 3 Encounters:  12/17/17 (!) 97.4 F (36.3 C) (Oral)  12/03/17 98.4 F (36.9 C) (Oral)  11/13/17 98.2 F (36.8 C) (Oral)   BP Readings from Last 3 Encounters:  12/17/17 99/74  12/11/17 (!) 108/0  12/03/17 (!) 98/47   Pulse Readings from Last 3 Encounters:  12/17/17 (!) 55  12/11/17 87  12/03/17 70    In general this is a hronically ill appearing caucasian male in no acute distress. He is alert and oriented x4 and appropriate throughout the examination. HEENT reveals that the patient is normocephalic, atraumatic. EOMs are intact. PERRLA. Skin is intact and reveals echars at the apex and mid aspect of his incision site. No erythema is noted. Cardiovascular exam reveals a wooshing sound along the precordium with faint S1 noted intermittently. Chest is clear to auscultation bilaterally. Lymphatic assessment is performed and does not reveal any adenopathy in the cervical, supraclavicular, axillary, or inguinal chains. Abdomen has active bowel sounds in all quadrants and is intact. The abdomen is soft, non tender, non distended. Lower extremities are negative for pretibial pitting edema, deep calf  tenderness, cyanosis or clubbing.   ECOG = 1  0 - Asymptomatic (Fully active, able to carry on all predisease activities without restriction)  1 - Symptomatic but completely ambulatory (Restricted in physically strenuous activity but ambulatory and able to carry out work of a light or sedentary nature. For example, light housework, office work)  2 - Symptomatic, <50% in bed during the day (Ambulatory  and capable of all self care but unable to carry out any work activities. Up and about more than 50% of waking hours)  3 - Symptomatic, >50% in bed, but not bedbound (Capable of only limited self-care, confined to bed or chair 50% or more of waking hours)  4 - Bedbound (Completely disabled. Cannot carry on any self-care. Totally confined to bed or chair)  5 - Death   Eustace Pen MM, Creech RH, Tormey DC, et al. [phone]). "Toxicity and response criteria of the [facility]". [place] Oncol. 5 (6): 649-55    LABORATORY DATA:  Lab Results  Component Value Date   WBC 10.0 12/11/2017   HGB 9.8 (L) 12/11/2017   HCT 31.5 (L) 12/11/2017   MCV 97.5 12/11/2017   PLT 167 12/11/2017   Lab Results  Component Value Date   NA 133 (L) 12/11/2017   K 4.1 12/11/2017   CL 93 (L) 12/11/2017   CO2 27 12/11/2017   Lab Results  Component Value Date   ALT 20 12/11/2017   AST 29 12/11/2017   ALKPHOS 82 12/11/2017   BILITOT 1.2 12/11/2017      RADIOGRAPHY: Ct Soft Tissue Neck W Contrast  Result Date: 11/25/2017 CLINICAL DATA:  Left neck mass.  Recent excision of skin cancer. EXAM: CT NECK WITH CONTRAST TECHNIQUE: Multidetector CT imaging of the neck was performed using the standard protocol following the bolus administration of intravenous contrast. CONTRAST:  <See Chart> ISOVUE-300 IOPAMIDOL (ISOVUE-300) INJECTION 61%, <See Chart> ISOVUE-300 IOPAMIDOL (ISOVUE-300) INJECTION 61% COMPARISON:  None. FINDINGS: Pharynx and larynx: Mild esophageal thickening is present. No focal mucosal  or submucosal lesion. The tongue base is within normal limits. Vocal cords are midline and symmetric. Oropharynx is within limits. Hypopharynx is unremarkable. The epiglottis is within normal limits. Salivary glands: The submandibular and parotid glands are within normal limits bilaterally. Thyroid: Normal. Lymph nodes: Subcentimeter left supraclavicular nodes are likely reactive. No significant adenopathy is present. Vascular: Atherosclerotic calcifications are present at the carotid bifurcations bilaterally. No definite stenosis of greater than 50%. Limited intracranial: Unremarkable. Visualized orbits: Within normal limits. Mastoids and visualized paranasal sinuses: The paranasal sinuses and mastoid air cells are clear. Skeleton: Degenerative changes are present in the cervical spine. Grade 1 anterolisthesis is present at C3-4. Chronic loss disc space with uncovertebral spurring bilaterally at C4-5, C5-6, and to lesser extent C6-7. Upper chest: The lung apices are clear. Median sternotomy is again noted. Other: A left neck is mixed density collection is noted posterior to the left sternocleidomastoid muscle. The collection measures 9.8 x 3.7 x 3.4 cm. There is gas within the collection. High-density material near the superior aspect of the collection is concerning for recurrent hemorrhage. This creates mass effect in the subcutaneous space. Some mass effect on the left sternocleidomastoid muscle. IMPRESSION: 1. 9.8 cm mixed density left subcutaneous hyperdense collection compatible with acute hematoma. 2. Active extravasation into the superior aspect of the collection compatible with arterial hemorrhage. 3. Superficial mass effect on the left sternocleidomastoid muscle. 4. Extensive atherosclerotic disease. 5. Atherosclerosis. These results were called by telephone at the time of interpretation on 11/25/2017 at 2:25 pm to [name] , who verbally acknowledged these results. Electronically Signed   By: San Morelle M.D.   On: 11/25/2017 14:31   Ct Chest W Contrast  Result Date: 11/25/2017 CLINICAL DATA:  Inpatient. Hemoptysis. Status post left neck mass surgery. LVAD. EXAM: CT CHEST WITH CONTRAST TECHNIQUE: Multidetector CT imaging of the chest was performed  during intravenous contrast administration. CONTRAST:  75 cc ISOVUE-300 IOPAMIDOL (ISOVUE-300) INJECTION 61%, <See Chart> ISOVUE-300 IOPAMIDOL (ISOVUE-300) INJECTION 61% COMPARISON:  Chest radiograph from one day prior. 12/03/2016 chest CT. FINDINGS: Cardiovascular: Mild cardiomegaly. Left ventricular assist device appears well positioned with the inflow catheter in place at the left ventricular apex and outflow catheter anastomosed to the ascending aorta. No significant pericardial fluid/thickening. Aortic valve prosthesis is in place. Tricuspid valve prosthesis is in place. Left main and 3 vessel coronary atherosclerosis status post CABG. Atherosclerotic nonaneurysmal thoracic aorta. Dilated main pulmonary artery (4.1 cm node), stable. No central pulmonary emboli. Single lead right subclavian ICD is noted with lead tip in the right ventricular apex. Left internal jugular central venous catheter terminates at the cavoatrial junction. Mediastinum/Nodes: Stable hypodense 1.3 cm anterior right thyroid lobe nodule. Small amount of retained oral contrast in the mid to lower thoracic esophagus. No pathologically enlarged axillary, mediastinal or hilar lymph nodes. No mediastinal fluid collections. No pneumomediastinum. Lungs/Pleura: No pneumothorax. No pleural effusion. No acute consolidative airspace disease or lung masses. Mild patchy tree-in-bud opacity in the right lower lobe is unchanged since 12/03/2016 chest CT, compatible with benign postinflammatory opacity. No significant pulmonary nodules. Upper abdomen: Contrast reflux is seen into the IVC and hepatic veins. Musculoskeletal: No aggressive appearing focal osseous lesions. Intact sternotomy wires. Marked  thoracic spondylosis. IMPRESSION: 1. No active pulmonary disease. Stable chronic mild postinflammatory tree-in-bud opacities in right lower lobe. 2. Cardiomegaly. Contrast reflux into the IVC and hepatic veins, indicative of elevated central venous pressures/right heart failure. LVAD appears well-positioned. No mediastinal fluid collections. 3. Stable dilated main pulmonary artery compatible with chronic pulmonary arterial hypertension. 4. Small amount of oral contrast in the mid to lower thoracic esophagus, suggesting esophageal dysmotility and/or gastroesophageal reflux. Aortic Atherosclerosis (ICD10-I70.0). Electronically Signed   By: Ilona Sorrel M.D.   On: 11/25/2017 14:12       IMPRESSION/PLAN: 1. Squamous Cell Carcinoma of the skin involving the left neck. [name] discusses the pathology findings and reviews the nature of skin cancer and outlines the rationale for radiotherapy in scenarios of positive margins, or inability to re-resect disease. We also discussed the options of close surveillance. We discussed the risks, benefits, short, and long term effects of radiotherapy, and the patient is interested in proceeding. [name] discusses the delivery and logistics of radiotherapy and anticipates a course of 3 weeks. The patient is not sure yet if he's interested in proceeding with treatment. We will contact him next week to determine how he'd like to proceed. 2. Heart Failure with LVAD. The patient will continue to follow up with [name] for management of this and is seen weekly in the LVAD clinic. We appreciate any recommendations regarding management of his device during treatment if he elects for this. 3. End Stage Renal Disease on HD. The patient will continue M/W/F/S visits under the care of [facility].  The above documentation reflects my direct findings during this shared patient visit. Please see the separate note by [name] on this date for the remainder of the patient's plan of  care.    Carola Rhine, PAC This document serves as a record of services personally performed by Kyung Rudd, MD and Shona Simpson, PA-C. It was created on their behalf by Bethann Humble, a trained medical scribe. The creation of this record is based on the scribe's personal observations and the provider's statements to them. This document has been checked and approved by the attending provider.

## 2017-12-25 NOTE — Progress Notes (Signed)
Patient presents to clinic today for drive line exit wound care. Existing VAD dressing removed and site care performed using sterile technique. Drive line exit site cleaned with Chlora prep applicators x 2, allowed to dry, and Sorbaview dressing with bio patch re-applied. Exit site healed and incorporated, the velour is fully implanted at exit site. No redness, tenderness, drainage, foul odor or rash noted. Drive line anchor re-applied. Pt denies fever or chills.   Return in 1 week for another dressing change per standard of care. INR to be repeated in 1-2 weeks pending results per anticoagulation protocol.    Lehi Phifer RN, VAD Coordinator 24/7 pager [phone]  

## 2017-12-26 ENCOUNTER — Other Ambulatory Visit (HOSPITAL_COMMUNITY): Payer: Self-pay | Admitting: Unknown Physician Specialty

## 2017-12-26 DIAGNOSIS — Z95811 Presence of heart assist device: Secondary | ICD-10-CM

## 2018-01-01 ENCOUNTER — Ambulatory Visit (HOSPITAL_COMMUNITY)
Admission: RE | Admit: 2018-01-01 | Discharge: 2018-01-01 | Disposition: A | Discharge status: Home / Self Care | Payer: Medicare Other | Source: Ambulatory Visit | Attending: Internal Medicine | Admitting: Internal Medicine

## 2018-01-01 ENCOUNTER — Other Ambulatory Visit: Payer: Self-pay | Admitting: Unknown Physician Specialty

## 2018-01-01 ENCOUNTER — Ambulatory Visit (HOSPITAL_COMMUNITY): Payer: Self-pay | Admitting: Pharmacist

## 2018-01-01 DIAGNOSIS — Z452 Encounter for adjustment and management of vascular access device: Secondary | ICD-10-CM | POA: Insufficient documentation

## 2018-01-01 DIAGNOSIS — Z95811 Presence of heart assist device: Secondary | ICD-10-CM | POA: Diagnosis not present

## 2018-01-01 DIAGNOSIS — I5022 Chronic systolic (congestive) heart failure: Secondary | ICD-10-CM | POA: Insufficient documentation

## 2018-01-01 LAB — LACTATE DEHYDROGENASE: LDH: 229 U/L — AB (ref 98–192)

## 2018-01-01 LAB — PROTIME-INR
INR: 1.97
Prothrombin Time: 22.3 seconds — ABNORMAL HIGH (ref 11.4–15.2)

## 2018-01-01 MED ORDER — TRAMADOL HCL 50 MG PO TABS: 50.0000 mg | ORAL_TABLET | Freq: Four times a day (QID) | ORAL | 3 refills | Status: DC | PRN

## 2018-01-01 NOTE — Progress Notes (Signed)
Patient presents to clinic today for drive line exit wound care. Existing VAD dressing removed and site care performed using sterile technique. Drive line exit site cleaned with Chlora prep applicators x 2, allowed to dry, and Sorbaview dressing with bio patch re-applied. Exit site healed and incorporated, the velour is fully implanted at exit site. No redness, tenderness, drainage, foul odor or rash noted. Drive line anchor re-applied. Pt denies fever or chills.   Return in 1 week for another dressing change per standard of care. INR to be repeated in 1-2 weeks pending results per anticoagulation protocol.   [name], VAD Coordinator 24/7 pager [phone]

## 2018-01-02 ENCOUNTER — Other Ambulatory Visit (HOSPITAL_COMMUNITY): Payer: Self-pay | Admitting: *Deleted

## 2018-01-02 DIAGNOSIS — Z95811 Presence of heart assist device: Secondary | ICD-10-CM

## 2018-01-07 ENCOUNTER — Telehealth: Payer: Self-pay | Admitting: Radiation Oncology

## 2018-01-07 NOTE — Telephone Encounter (Signed)
I spoke with the patient, he's still having some echars along his surgical site and has not yet decided about radiation treatment. I will call him in 2-3 weeks to see if he's made any decisions regarding treatment.

## 2018-01-08 ENCOUNTER — Ambulatory Visit (HOSPITAL_COMMUNITY)
Admission: RE | Admit: 2018-01-08 | Discharge: 2018-01-08 | Disposition: A | Discharge status: Home / Self Care | Payer: Medicare Other | Source: Ambulatory Visit | Attending: Internal Medicine | Admitting: Internal Medicine

## 2018-01-08 ENCOUNTER — Telehealth (HOSPITAL_COMMUNITY): Payer: Self-pay | Admitting: Pharmacist

## 2018-01-08 ENCOUNTER — Ambulatory Visit (HOSPITAL_COMMUNITY): Payer: Self-pay | Admitting: Pharmacist

## 2018-01-08 DIAGNOSIS — Z95811 Presence of heart assist device: Secondary | ICD-10-CM | POA: Diagnosis present

## 2018-01-08 LAB — PROTIME-INR
INR: 1.82
Prothrombin Time: 20.9 seconds — ABNORMAL HIGH (ref 11.4–15.2)

## 2018-01-08 NOTE — Telephone Encounter (Signed)
Sildenafil 40 mg TID PA approved by [employer] through 11/03/18.   Ruta Hinds. Velva Harman, PharmD, BCPS, CPP Clinical Pharmacist Phone: [phone] 01/08/2018 2:09 PM

## 2018-01-08 NOTE — Progress Notes (Signed)
Patient presents to clinic today for drive line exit wound care. Existing VAD dressing removed and site care performed using sterile technique. Drive line exit site cleaned with Chlora prep applicators x 2, allowed to dry, and Sorbaview dressing with bio patch re-applied. Exit site healed and incorporated, the velour is fully implanted at exit site. No redness, tenderness, drainage, foul odor or rash noted. Drive line anchor re-applied. Pt denies fever or chills.   Return in 1 week for another dressing change per standard of care. INR to be repeated in 1-2 weeks pending results per anticoagulation protocol.   Molly Reece RN, VAD Coordinator 24/7 pager [phone]  

## 2018-01-09 ENCOUNTER — Other Ambulatory Visit (HOSPITAL_COMMUNITY): Payer: Self-pay | Admitting: Unknown Physician Specialty

## 2018-01-09 DIAGNOSIS — Z7901 Long term (current) use of anticoagulants: Secondary | ICD-10-CM

## 2018-01-09 DIAGNOSIS — Z95811 Presence of heart assist device: Secondary | ICD-10-CM

## 2018-01-14 ENCOUNTER — Other Ambulatory Visit: Payer: Self-pay | Admitting: *Deleted

## 2018-01-14 MED ORDER — OCTREOTIDE ACETATE 20 MG IM KIT: 20.0000 mg | PACK | Freq: Once | INTRAMUSCULAR | Status: DC

## 2018-01-15 ENCOUNTER — Ambulatory Visit (HOSPITAL_COMMUNITY): Payer: Self-pay | Admitting: Pharmacist

## 2018-01-15 ENCOUNTER — Encounter (HOSPITAL_COMMUNITY)
Admission: RE | Admit: 2018-01-15 | Discharge: 2018-01-15 | Disposition: A | Discharge status: Home / Self Care | Payer: Medicare Other | Source: Ambulatory Visit | Attending: Cardiology | Admitting: Cardiology

## 2018-01-15 ENCOUNTER — Ambulatory Visit (HOSPITAL_COMMUNITY)
Admission: RE | Admit: 2018-01-15 | Discharge: 2018-01-15 | Disposition: A | Discharge status: Home / Self Care | Payer: Medicare Other | Source: Ambulatory Visit | Attending: Internal Medicine | Admitting: Internal Medicine

## 2018-01-15 DIAGNOSIS — Z95811 Presence of heart assist device: Secondary | ICD-10-CM | POA: Diagnosis not present

## 2018-01-15 DIAGNOSIS — Z4800 Encounter for change or removal of nonsurgical wound dressing: Secondary | ICD-10-CM | POA: Diagnosis not present

## 2018-01-15 DIAGNOSIS — Z7901 Long term (current) use of anticoagulants: Secondary | ICD-10-CM | POA: Diagnosis not present

## 2018-01-15 LAB — PROTIME-INR
INR: 2.19
Prothrombin Time: 24.2 seconds — ABNORMAL HIGH (ref 11.4–15.2)

## 2018-01-15 MED ORDER — OCTREOTIDE ACETATE 20 MG IM KIT
20.0000 mg | PACK | Freq: Once | INTRAMUSCULAR | Status: AC
  Administered 2018-01-15: 20 mg via INTRAMUSCULAR
  Filled 2018-01-15: qty 1

## 2018-01-15 MED ORDER — OCTREOTIDE ACETATE 20 MG IM KIT: 10.0000 mg | PACK | Freq: Once | INTRAMUSCULAR | Status: DC

## 2018-01-15 NOTE — Progress Notes (Signed)
Patient presents to clinic today for drive line exit wound care. Existing VAD dressing removed and site care performed using sterile technique. Drive line exit site cleaned with alcohol wipes and rinsed with sterile saline, allowed to dry, and Sorbaview dressing with bio patch re-applied. Exit site healed and incorporated, the velour is fully implanted at exit site. No redness, tenderness, drainage, foul odor or rash noted. Drive line anchor re-applied. Pt denies fever or chills.   Return in 1 week for another dressing change per standard of care. INR to be repeated in 1-2 weeks pending results per anticoagulation protocol.   [name] RN, VAD Coordinator 24/7 pager [phone]

## 2018-01-15 NOTE — Addendum Note (Signed)
Encounter addended by: Lezlie Octave, RN on: 01/15/2018 11:16 AM  Actions taken: Sign clinical note

## 2018-01-16 ENCOUNTER — Other Ambulatory Visit (HOSPITAL_COMMUNITY): Payer: Self-pay | Admitting: Unknown Physician Specialty

## 2018-01-16 DIAGNOSIS — Z95811 Presence of heart assist device: Secondary | ICD-10-CM

## 2018-01-16 DIAGNOSIS — Z7901 Long term (current) use of anticoagulants: Secondary | ICD-10-CM

## 2018-01-21 ENCOUNTER — Other Ambulatory Visit (HOSPITAL_COMMUNITY): Payer: Self-pay | Admitting: Pharmacist

## 2018-01-21 DIAGNOSIS — Z95811 Presence of heart assist device: Secondary | ICD-10-CM

## 2018-01-22 ENCOUNTER — Ambulatory Visit (HOSPITAL_COMMUNITY): Payer: Self-pay | Admitting: Pharmacist

## 2018-01-22 ENCOUNTER — Ambulatory Visit (HOSPITAL_COMMUNITY)
Admission: RE | Admit: 2018-01-22 | Discharge: 2018-01-22 | Disposition: A | Discharge status: Home / Self Care | Payer: Medicare Other | Source: Ambulatory Visit | Attending: Cardiology | Admitting: Cardiology

## 2018-01-22 ENCOUNTER — Telehealth (HOSPITAL_COMMUNITY): Payer: Self-pay | Admitting: Pharmacist

## 2018-01-22 ENCOUNTER — Other Ambulatory Visit (HOSPITAL_COMMUNITY): Payer: Self-pay | Admitting: *Deleted

## 2018-01-22 DIAGNOSIS — F320 Major depressive disorder, single episode, mild: Secondary | ICD-10-CM

## 2018-01-22 DIAGNOSIS — Z95811 Presence of heart assist device: Secondary | ICD-10-CM | POA: Insufficient documentation

## 2018-01-22 DIAGNOSIS — Z7901 Long term (current) use of anticoagulants: Secondary | ICD-10-CM | POA: Insufficient documentation

## 2018-01-22 DIAGNOSIS — F419 Anxiety disorder, unspecified: Secondary | ICD-10-CM

## 2018-01-22 LAB — MAGNESIUM: MAGNESIUM: 1.6 mg/dL — AB (ref 1.7–2.4)

## 2018-01-22 LAB — BASIC METABOLIC PANEL
Anion gap: 14 (ref 5–15)
BUN: 26 mg/dL — AB (ref 6–20)
CHLORIDE: 90 mmol/L — AB (ref 101–111)
CO2: 28 mmol/L (ref 22–32)
CREATININE: 3.56 mg/dL — AB (ref 0.61–1.24)
Calcium: 8.6 mg/dL — ABNORMAL LOW (ref 8.9–10.3)
GFR, EST AFRICAN AMERICAN: 18 mL/min — AB (ref >60)
GFR, EST NON AFRICAN AMERICAN: 16 mL/min — AB (ref >60)
Glucose, Bld: 210 mg/dL — ABNORMAL HIGH (ref 65–99)
POTASSIUM: 4.6 mmol/L (ref 3.5–5.1)
SODIUM: 132 mmol/L — AB (ref 135–145)

## 2018-01-22 LAB — PROTIME-INR
INR: 2.23
PROTHROMBIN TIME: 24.5 s — AB (ref 11.4–15.2)

## 2018-01-22 MED ORDER — BUSPIRONE HCL 5 MG PO TABS: 5.0000 mg | ORAL_TABLET | Freq: Two times a day (BID) | ORAL | 3 refills | Status: DC

## 2018-01-22 NOTE — Telephone Encounter (Signed)
Per discussion with [name] and low Mag level at 1.6, have advised Trevor [name] to take MagOx 400 mg daily.   Ruta Hinds. Velva Harman, PharmD, BCPS, CPP Clinical Pharmacist Phone: [phone] 01/22/2018 12:17 PM

## 2018-01-22 NOTE — Addendum Note (Signed)
Encounter addended by: Lezlie Octave, RN on: 01/22/2018 11:07 AM  Actions taken: Sign clinical note

## 2018-01-22 NOTE — Telephone Encounter (Signed)
Pt called requesting 90 day refill on Buspar. Will send to local pharmacy.

## 2018-01-22 NOTE — Progress Notes (Signed)
Patient presents to clinic today for drive line exit wound care. Existing VAD dressing removed and site care performed using sterile technique. Drive line exit site cleaned with alcohol wipes and rinsed with sterile saline, allowed to dry, and Sorbaview dressing with bio patch re-applied. Exit site healed and incorporated, the velour is fully implanted at exit site. No redness, tenderness, drainage, foul odor or rash noted. Drive line anchor re-applied. Pt denies fever or chills.   Return in 1 week for another dressing change per standard of care. INR to be repeated in 1-2 weeks pending results per anticoagulation protocol.   [name] RN, VAD Coordinator 24/7 pager [phone]

## 2018-01-23 ENCOUNTER — Other Ambulatory Visit (HOSPITAL_COMMUNITY): Payer: Self-pay | Admitting: Unknown Physician Specialty

## 2018-01-23 DIAGNOSIS — Z7901 Long term (current) use of anticoagulants: Secondary | ICD-10-CM

## 2018-01-23 DIAGNOSIS — Z95811 Presence of heart assist device: Secondary | ICD-10-CM

## 2018-01-29 ENCOUNTER — Ambulatory Visit (HOSPITAL_COMMUNITY)
Admission: RE | Admit: 2018-01-29 | Discharge: 2018-01-29 | Disposition: A | Discharge status: Home / Self Care | Payer: Medicare Other | Source: Ambulatory Visit | Attending: Cardiology | Admitting: Cardiology

## 2018-01-29 ENCOUNTER — Ambulatory Visit (HOSPITAL_COMMUNITY): Payer: Self-pay | Admitting: Pharmacist

## 2018-01-29 DIAGNOSIS — Z7901 Long term (current) use of anticoagulants: Secondary | ICD-10-CM

## 2018-01-29 DIAGNOSIS — Z4502 Encounter for adjustment and management of automatic implantable cardiac defibrillator: Secondary | ICD-10-CM | POA: Diagnosis not present

## 2018-01-29 DIAGNOSIS — N186 End stage renal disease: Secondary | ICD-10-CM | POA: Diagnosis not present

## 2018-01-29 DIAGNOSIS — I132 Hypertensive heart and chronic kidney disease with heart failure and with stage 5 chronic kidney disease, or end stage renal disease: Secondary | ICD-10-CM | POA: Diagnosis not present

## 2018-01-29 DIAGNOSIS — I5022 Chronic systolic (congestive) heart failure: Secondary | ICD-10-CM | POA: Insufficient documentation

## 2018-01-29 DIAGNOSIS — I4891 Unspecified atrial fibrillation: Secondary | ICD-10-CM | POA: Diagnosis not present

## 2018-01-29 DIAGNOSIS — Z95811 Presence of heart assist device: Secondary | ICD-10-CM

## 2018-01-29 LAB — PROTIME-INR
INR: 2.11
Prothrombin Time: 23.4 seconds — ABNORMAL HIGH (ref 11.4–15.2)

## 2018-01-29 NOTE — Addendum Note (Signed)
Encounter addended by: Lezlie Octave, RN on: 01/29/2018 8:46 AM  Actions taken: Sign clinical note

## 2018-01-29 NOTE — Progress Notes (Signed)
Patient presents to clinic today for drive line exit wound care. Existing VAD dressing removed and site care performed using sterile technique. Drive line exit site cleaned with alcohol wipes and rinsed with sterile saline, allowed to dry, and Sorbaview dressing without bio patch re-applied. Exit site healed and incorporated, the velour is fully implanted at exit site. Redness noted under bio-patch, will leave off this visit; no tenderness, drainage, foul odor or rash noted. Drive line anchor re-applied. Pt denies fever or chills.   Return in 1 week for another dressing change per standard of care. INR to be repeated in 1-2 weeks pending results per anticoagulation protocol.   [name] RN, VAD Coordinator 24/7 pager [phone]

## 2018-02-04 ENCOUNTER — Other Ambulatory Visit (HOSPITAL_COMMUNITY): Payer: Self-pay | Admitting: *Deleted

## 2018-02-04 ENCOUNTER — Telehealth: Payer: Self-pay | Admitting: Radiation Oncology

## 2018-02-04 DIAGNOSIS — Z7901 Long term (current) use of anticoagulants: Secondary | ICD-10-CM

## 2018-02-04 DIAGNOSIS — Z95811 Presence of heart assist device: Secondary | ICD-10-CM

## 2018-02-04 DIAGNOSIS — I5043 Acute on chronic combined systolic (congestive) and diastolic (congestive) heart failure: Secondary | ICD-10-CM

## 2018-02-04 NOTE — Telephone Encounter (Signed)
I spoke with the patient and he has elected to forgo radiation and will follow up with dermatology in surveillance. We will be available in the future to see him as indicated.

## 2018-02-05 ENCOUNTER — Ambulatory Visit (HOSPITAL_COMMUNITY): Payer: Self-pay | Admitting: Pharmacist

## 2018-02-05 ENCOUNTER — Ambulatory Visit (HOSPITAL_COMMUNITY)
Admission: RE | Admit: 2018-02-05 | Discharge: 2018-02-05 | Disposition: A | Discharge status: Home / Self Care | Payer: Medicare Other | Source: Ambulatory Visit | Attending: Cardiology | Admitting: Cardiology

## 2018-02-05 ENCOUNTER — Other Ambulatory Visit: Payer: Self-pay | Admitting: Emergency Medicine

## 2018-02-05 DIAGNOSIS — Z7901 Long term (current) use of anticoagulants: Secondary | ICD-10-CM | POA: Insufficient documentation

## 2018-02-05 DIAGNOSIS — Z95811 Presence of heart assist device: Secondary | ICD-10-CM

## 2018-02-05 DIAGNOSIS — I5043 Acute on chronic combined systolic (congestive) and diastolic (congestive) heart failure: Secondary | ICD-10-CM | POA: Insufficient documentation

## 2018-02-05 LAB — PROTIME-INR
INR: 2.24
Prothrombin Time: 24.6 s — ABNORMAL HIGH (ref 11.4–15.2)

## 2018-02-05 NOTE — Addendum Note (Signed)
Encounter addended by: Lezlie Octave, RN on: 02/05/2018 9:26 AM  Actions taken: Sign clinical note

## 2018-02-05 NOTE — Progress Notes (Signed)
Patient presents to clinic today for drive line exit wound care. Existing VAD dressing removed and site care performed using sterile technique. Drive line exit site cleaned with alcohol wipes and rinsed with sterile saline, allowed to dry, and Sorbaview dressing without bio patch re-applied. Exit site healed and incorporated, the velour is fully implanted at exit site. Redness noted under bio-patch and around dressing edge. Will continue to leave off Biopatch and will not use skin prep.No tenderness, drainage, foul odor or noted. Drive line anchor re-applied and covered with two large Tegaderm. Pt denies fever or chills.   Return in 1 week for 2 mo f/u with MD.  [name] RN, VAD Coordinator 24/7 pager [phone]

## 2018-02-10 ENCOUNTER — Encounter (HOSPITAL_COMMUNITY): Payer: Self-pay

## 2018-02-10 ENCOUNTER — Ambulatory Visit (HOSPITAL_COMMUNITY): Payer: Self-pay | Admitting: Pharmacist

## 2018-02-10 ENCOUNTER — Other Ambulatory Visit (HOSPITAL_COMMUNITY): Payer: Self-pay | Admitting: Unknown Physician Specialty

## 2018-02-10 ENCOUNTER — Ambulatory Visit (HOSPITAL_COMMUNITY)
Admission: RE | Admit: 2018-02-10 | Discharge: 2018-02-10 | Disposition: A | Discharge status: Home / Self Care | Payer: Medicare Other | Source: Ambulatory Visit | Attending: Cardiology | Admitting: Cardiology

## 2018-02-10 VITALS — BP 104/0 | HR 71 | Ht 65.0 in | Wt 192.6 lb

## 2018-02-10 DIAGNOSIS — I5022 Chronic systolic (congestive) heart failure: Secondary | ICD-10-CM | POA: Diagnosis not present

## 2018-02-10 DIAGNOSIS — I2510 Atherosclerotic heart disease of native coronary artery without angina pectoris: Secondary | ICD-10-CM | POA: Insufficient documentation

## 2018-02-10 DIAGNOSIS — K5521 Angiodysplasia of colon with hemorrhage: Secondary | ICD-10-CM | POA: Insufficient documentation

## 2018-02-10 DIAGNOSIS — Z951 Presence of aortocoronary bypass graft: Secondary | ICD-10-CM | POA: Diagnosis not present

## 2018-02-10 DIAGNOSIS — Z79891 Long term (current) use of opiate analgesic: Secondary | ICD-10-CM | POA: Insufficient documentation

## 2018-02-10 DIAGNOSIS — Z794 Long term (current) use of insulin: Secondary | ICD-10-CM | POA: Insufficient documentation

## 2018-02-10 DIAGNOSIS — Z95811 Presence of heart assist device: Secondary | ICD-10-CM

## 2018-02-10 DIAGNOSIS — D638 Anemia in other chronic diseases classified elsewhere: Secondary | ICD-10-CM | POA: Insufficient documentation

## 2018-02-10 DIAGNOSIS — Z833 Family history of diabetes mellitus: Secondary | ICD-10-CM | POA: Diagnosis not present

## 2018-02-10 DIAGNOSIS — I482 Chronic atrial fibrillation: Secondary | ICD-10-CM | POA: Insufficient documentation

## 2018-02-10 DIAGNOSIS — N186 End stage renal disease: Secondary | ICD-10-CM | POA: Insufficient documentation

## 2018-02-10 DIAGNOSIS — L409 Psoriasis, unspecified: Secondary | ICD-10-CM | POA: Diagnosis not present

## 2018-02-10 DIAGNOSIS — Z79899 Other long term (current) drug therapy: Secondary | ICD-10-CM | POA: Diagnosis not present

## 2018-02-10 DIAGNOSIS — C4492 Squamous cell carcinoma of skin, unspecified: Secondary | ICD-10-CM | POA: Insufficient documentation

## 2018-02-10 DIAGNOSIS — Z952 Presence of prosthetic heart valve: Secondary | ICD-10-CM | POA: Insufficient documentation

## 2018-02-10 DIAGNOSIS — G4733 Obstructive sleep apnea (adult) (pediatric): Secondary | ICD-10-CM | POA: Insufficient documentation

## 2018-02-10 DIAGNOSIS — I132 Hypertensive heart and chronic kidney disease with heart failure and with stage 5 chronic kidney disease, or end stage renal disease: Secondary | ICD-10-CM | POA: Diagnosis present

## 2018-02-10 DIAGNOSIS — Z7901 Long term (current) use of anticoagulants: Secondary | ICD-10-CM

## 2018-02-10 DIAGNOSIS — I50810 Right heart failure, unspecified: Secondary | ICD-10-CM

## 2018-02-10 DIAGNOSIS — I255 Ischemic cardiomyopathy: Secondary | ICD-10-CM | POA: Diagnosis not present

## 2018-02-10 DIAGNOSIS — Z8249 Family history of ischemic heart disease and other diseases of the circulatory system: Secondary | ICD-10-CM | POA: Diagnosis not present

## 2018-02-10 DIAGNOSIS — E1122 Type 2 diabetes mellitus with diabetic chronic kidney disease: Secondary | ICD-10-CM | POA: Insufficient documentation

## 2018-02-10 LAB — CBC
HCT: 31.1 % — ABNORMAL LOW (ref 39.0–52.0)
HEMOGLOBIN: 10 g/dL — AB (ref 13.0–17.0)
MCH: 30.6 pg (ref 26.0–34.0)
MCHC: 32.2 g/dL (ref 30.0–36.0)
MCV: 95.1 fL (ref 78.0–100.0)
Platelets: 157 10*3/uL (ref 150–400)
RBC: 3.27 MIL/uL — AB (ref 4.22–5.81)
RDW: 16.8 % — ABNORMAL HIGH (ref 11.5–15.5)
WBC: 10.8 10*3/uL — ABNORMAL HIGH (ref 4.0–10.5)

## 2018-02-10 LAB — BASIC METABOLIC PANEL
Anion gap: 13 (ref 5–15)
BUN: 22 mg/dL — AB (ref 6–20)
CHLORIDE: 88 mmol/L — AB (ref 101–111)
CO2: 30 mmol/L (ref 22–32)
Calcium: 8.9 mg/dL (ref 8.9–10.3)
Creatinine, Ser: 3.69 mg/dL — ABNORMAL HIGH (ref 0.61–1.24)
GFR calc Af Amer: 17 mL/min — ABNORMAL LOW (ref >60)
GFR calc non Af Amer: 15 mL/min — ABNORMAL LOW (ref >60)
GLUCOSE: 227 mg/dL — AB (ref 65–99)
Potassium: 4.6 mmol/L (ref 3.5–5.1)
Sodium: 131 mmol/L — ABNORMAL LOW (ref 135–145)

## 2018-02-10 LAB — PROTIME-INR
INR: 1.99
Prothrombin Time: 22.4 seconds — ABNORMAL HIGH (ref 11.4–15.2)

## 2018-02-10 LAB — LACTATE DEHYDROGENASE: LDH: 267 U/L — ABNORMAL HIGH (ref 98–192)

## 2018-02-10 MED ORDER — FLUTICASONE PROPIONATE HFA 110 MCG/ACT IN AERO: 2.0000 | INHALATION_SPRAY | Freq: Two times a day (BID) | RESPIRATORY_TRACT | 5 refills | Status: AC

## 2018-02-10 MED ORDER — ALBUTEROL SULFATE HFA 108 (90 BASE) MCG/ACT IN AERS: 2.0000 | INHALATION_SPRAY | RESPIRATORY_TRACT | 5 refills | Status: AC | PRN

## 2018-02-10 NOTE — Progress Notes (Addendum)
Patient presents for 2 mo follow up in [facility] today. Reports no problems with VAD equipment or concerns with drive line.   States he is doing his own leg dressing change every other day and site "looks better". Pt denies any fever or chills.   Vital Signs:  Doppler Pressure 104 Automatc BP:  136/64 (93) HR:  71 SPO2:  97  Weight: 192.6 lb w/ eqt Last weight: 181.6 lb  VAD Indication: Destination Therapy age excluding    VAD interrogation & Equipment Management (reviewed with [name]): Speed:9600 Flow: 5.8 Power: 6.5w    PI: 4.3  Alarms: 2 no external powers-pt states that he accidentally double disconnected during the night when he was half asleep Events:60 - 70  Fixed speed 9600 Low speed limit: 9000  Primary Controller:  Replace back up battery in 8 months. Back up controller:   Replace back up battery in 8 months.  Annual Equipment Maintenance on UBC/PM was performed on 12/12/17.   I reviewed the LVAD parameters from today and compared the results to the patient's prior recorded data. LVAD interrogation was NEGATIVE for significant power changes, NEGATIVE for clinical alarms and STABLE for PI events/speed drops. No programming changes were made and pump is functioning within specified parameters. Pt is performing daily controller and system monitor self tests along with completing weekly and monthly maintenance for LVAD equipment.  LVAD equipment check completed and is in good working order. Back-up equipment present. Charged back up battery and performed self-test on equipment.   Exit Site Care: Drive line is being maintained weekly  by VAD Coordinators. Sorbaview dressing removed, dressing change per sterile technique. Drive line exit site well healed and incorporated. The velour is fully implanted at exit site. No erythema, tenderness, drainage, or rash noted.  Stabilization device present and accurately applied. Pt denies fever or chills.   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 Margins not clear at neck.   Device:Medtronic single lead Therapies: on at 231 bpm Last check:07/31/2017  BP & Labs:  Doppler 104 - reflecting modified systolic  Hgb 77.4 - No S/S of bleeding. Specifically denies melena/BRBPR or nosebleeds.  LDH stable at 267 with established baseline of 225- 300. Denies tea-colored urine. No power elevations noted on interrogation.    Patient Instructions: 1. No change in meds. 2. Refills sent for inhalers. 3. Will call dialysis center to find out if when pt can transfer to different dialysis center. 4. Return in one week for dressing change and INR. 5. Return to [place] [facility] in 2 months.  [name] VAD Coordinator   Office: [phone] 24/7 Emergency VAD Pager: [phone]     Trevor Toruno Murphyis a 72 y.o.malewith a history of CAD s/p CABG x 4 2010, OSA, AS with TAVR 2015, CKD, DM2, paroxysmal atrial fibrillation, asthma, and ischemic cardiomyopathy with Medtronic ICD.   Relocated to [place] from [place] in early 2018. Over the last couple of years, he had been admitted multiple times for CHF. Last admission in [place] was in 1/18. Sounds like the hospitalization was complicated by cardiorenal syndrome and there was consideration of doing dialysis.  He was admitted in to [facility] 11/27/16 with NYHA class IV symptoms and hypotension. He was placed on dual inotropes to facilitate diuresis, cardiac output and renal function.  CT surgery consulted for mechanical support and he was  deemed appropriate for LVAD work up. He completed LVAD work up and was approved for HMII LVAD --> DT. On 12/11/16, he had IABP placed to optimize cardiac output and improve renal function. He then underwent HMII LVAD placement + tricuspid valve repair on 12/14/2015. Post operatively pressors weaned off  slowly. He required significant diuresis.  Course was complicated by RV dysfunction and renal dysfunction.  Patient was admitted with lower GI bleeding in 4/18.  He had been on ASA 325 (increased with mild LDH elevation) and warfarin INR 2-2.5.  He had 4 units PRBCs total.  Colonoscopy showed colonic AVM treated with APC and clipping.  He was sent home off ASA and on warfarin INR goal 2-2.5.   In 5/18, creatinine was noted to increase as high as 3.67.  I cut back on his diuretics but he became volume overloaded.  I increased diuretics again with poor response.  In 5/18, he had RHC showing elevated right and left heart filling pressures and preserved cardiac output. I admitted him for IV diuresis.  Weight and creatinine came down, creatinine was 2.9 when discharged.  Ramp echo was done and speed was increased to 9200 rpm.  Sildenafil was increased to 40 mg tid.  At the next visit, speed was increased to 9400 rpm and torsemide was decreased to 40 mg daily, later increased back to 60 mg daily with increased volume.   He continued to have problems with up and down renal function, requiring up and down adjustment of torsemide.  He finally was admitted to the hospital in 8/18 with intractable volume overload.  Efforts at diuresis were complicated by AKI.  Speed was increased to 9600 rpm in the hospital. He finally had to undergo CVVH, then was started on intermittent HD.  He was discharged home to continue on HD.    He was admitted in 10/18 for inpatient HD due to excessive weight gain.  He is now going to HD 4 times/week.    In 1/19, he was admitted for skin cancer removal.  He had a squamous cell skin cancer removed from his left neck, margins not clear.  He had a squamous cell skin cancer removed from the right leg, margins were clear.  This admission complicated by hematoma formation at neck site requiring evacuation.  His right leg site appeared to be infected and he had a course of vancomycin.   He  returns for followup of LVAD and ESRD today.  Generally stable.  Still feels worn out after HD and still doing HD 4 days/week.  He has multiple PI events on HD days. He feels pretty good on non-HD days.  MAP 93 today.  No BRBPR/melena.  Trevor [name] is poor.  Weight has remained stable.  No lightheadedness, syncope, falls.  He went to see [name] about the neck squamous cell cancer site.  Radiation was recommended, but he wants to hold off on radiation for now.  He is worn out with dialysis, and thinks adding in radiation would be too much for him.    Labs (3/18): LDH 331 => 401 => 443 => 366 => 313, K 3.6 => 3.2 => 3.5, creatinine 1.98 => 1.74 => 1.8 => 1.79, hgb 8.3 => 8.9 => 8.7 => 8.4, INR 2.11 => 2.4 Labs (4/18): hgb 9.4 Labs (5/18): LDH 269 => 249, INR 2.57, K 4.5, creatinine 1.92 => 2.71 => 3.67 => 3.43 => 3.1 => 2.93, hgb 9, plts 104 Labs (6/18): K 4.5, creatinine 3.6 => 3.43, hgb  8.5, INR 2.4, LDH 287\ Labs (7/18): K 4.5, creatinine 3.52 => 4, hgb 7.9 => 8.5 Labs (8/18): K 3.8, creatinine 4, hgb 8.5 Labs (9/18): hgb 8.2 => 9 => 9.9 => 10.6, plts 143, LDH 205 => 251 Labs (4/19): hgb 10, LDH 267  LVAD parameters: See nurse's note above.  I reviewed.   PMH: 1. CAD: s/p CABG in 2010.  2. Aortic stenosis: s/p TAVR in 2015. Valve looked ok on 2/18 echo.  3. Atrial fibrillation: Chronic.  4. Type II diabetes.  5. ESRD  6. Chronic systolic CHF: Ischemic cardiomyopathy with prominent RV dysfunction.  Medtronic ICD.  - Echo (2/18): EF 20-25%, moderately dilated LV, moderately dilated/moderately dysfunctional RV, severe TR.  - Cardiogenic shock 2/18 requiring milrinone, norepinephrine, and IABP.  - Heartmate II LVAD for DT placed 12/13/16.   - RHC (5/18): mean RA 16, PA 55/22 mean 33, mean PCWP 23, CI 2.59, PVR 3.87 - RHC (8/18): mean RA 17, PA 56/25 mean 37, mean PCWP 20, CI 3.2 Fick/2.1 thermo 7. OSA.  8. Thrombocytopenia: Post-op LVAD, resolved.  9. Tricuspid regurgitation: Severe, s/p repair  with LVAD placement in 2/18.  10. Lower GI bleed (4/18): Colonic AVM, treated with APC and clipping.  11. Psoriasis 12. Squamous cell skin cancer: left neck, right leg => removed 1/19.  Margins not clear at neck. Radiation recommended but so far he wants to hold off.   Social History   Socioeconomic History  . Marital status: Widowed    Spouse name: Not on file  . Number of children: 0  . Years of education: 73  . Highest education level: Not on file  Occupational History  . Not on file  Social Needs  . Financial resource strain: Not on file  . Food insecurity:    Worry: Not on file    Inability: Not on file  . Transportation needs:    Medical: Not on file    Non-medical: Not on file  Tobacco Use  . Smoking status: Never Smoker  . Smokeless tobacco: Never Used  Substance and Sexual Activity  . Alcohol use: No  . Drug use: No  . Sexual activity: Not Currently  Lifestyle  . Physical activity:    Days per week: Not on file    Minutes per session: Not on file  . Stress: Not on file  Relationships  . Social connections:    Talks on phone: Not on file    Gets together: Not on file    Attends religious service: Not on file    Active member of club or organization: Not on file    Attends meetings of clubs or organizations: Not on file    Relationship status: Not on file  . Intimate partner violence:    Fear of current or ex partner: Not on file    Emotionally abused: Not on file    Physically abused: Not on file    Forced sexual activity: Not on file  Other Topics Concern  . Not on file  Social History Narrative   Patient is a widow. Moved to [place] from [place], [place]. Currently lives with his sister Trevor [name].    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    Family History  Problem Relation Age of Onset  . Heart failure Father   . Heart attack Father   . Healthy Mother   . Diabetes Paternal Grandfather    ROS: All systems reviewed and negative  except as per  HPI.   Current Outpatient Medications  Medication Sig Dispense Refill  . albuterol (PROVENTIL HFA;VENTOLIN HFA) 108 (90 Base) MCG/ACT inhaler Inhale 2 puffs into the lungs every 4 (four) hours as needed for wheezing or shortness of breath. 3 Inhaler 5  . atorvastatin (LIPITOR) 40 MG tablet TAKE 1 TABLET(40 MG) BY MOUTH DAILY (Patient taking differently: Take 40 mg by mouth daily at 6 PM. TAKE 1 TABLET(40 MG) BY MOUTH DAILY) 30 tablet 11  . busPIRone (BUSPAR) 5 MG tablet Take 1 tablet (5 mg total) by mouth 2 (two) times daily. 180 tablet 3  . calcitRIOL (ROCALTROL) 0.5 MCG capsule Take 1 capsule (0.5 mcg total) by mouth every other day. (Patient taking differently: Take 0.5 mcg by mouth every Monday, Wednesday, and Friday with hemodialysis. ) 15 capsule 6  . citalopram (CELEXA) 20 MG tablet Take 1 tablet (20 mg total) by mouth daily. 90 tablet 6  . docusate sodium (COLACE) 100 MG capsule Take 1 capsule (100 mg total) by mouth 2 (two) times daily. (Patient taking differently: Take 100 mg daily by mouth. ) 60 capsule 12  . ferric citrate (AURYXIA) 1 GM 210 MG(Fe) tablet Take 420 mg by mouth 3 (three) times daily with meals.    . fluticasone (FLOVENT HFA) 110 MCG/ACT inhaler Inhale 2 puffs into the lungs 2 (two) times daily. 3 Inhaler 5  . gabapentin (NEURONTIN) 600 MG tablet Take 0.5 tablets (300 mg total) by mouth 2 (two) times daily.    . insulin glargine (LANTUS) 100 unit/mL SOPN Inject 0.25 mLs (25 Units total) into the skin at bedtime. 1 pen 1  . levothyroxine (SYNTHROID, LEVOTHROID) 25 MCG tablet Take 1 tablet (25 mcg total) by mouth daily before breakfast. 90 tablet 3  . magnesium oxide (MAG-OX) 400 MG tablet Take 400 mg by mouth daily.    . multivitamin (RENA-VIT) TABS tablet Take 1 tablet by mouth at bedtime. 30 tablet 6  . pantoprazole (PROTONIX) 40 MG tablet Take 1 tablet (40 mg total) by mouth daily. 30 tablet 5  . sildenafil (REVATIO) 20 MG tablet Take 2 tablets (40 mg  total) 3 (three) times daily by mouth. 180 tablet 6  . traMADol (ULTRAM) 50 MG tablet Take 1 tablet (50 mg total) by mouth every 6 (six) hours as needed. TAKE 1 TABLET BY MOUTH EVERY 6 HOURS AS NEEDED FOR MODERATE PAIN 60 tablet 3  . traZODone (DESYREL) 100 MG tablet Take 1 tablet (100 mg total) by mouth at bedtime as needed for sleep. 30 tablet 3  . warfarin (COUMADIN) 5 MG tablet Take 1 and 1/2 tablets (7.5 mg) daily except 2 tablets (10 mg) on Tuesday and Thursday.    Marland Kitchen albuterol (PROVENTIL) (2.5 MG/3ML) 0.083% nebulizer solution Take 3 mLs (2.5 mg total) by nebulization every 4 (four) hours. And as needed (Patient not taking: Reported on 02/10/2018) 150 mL 5   Current Facility-Administered Medications  Medication Dose Route Frequency Provider Last Rate Last Dose  . octreotide (SANDOSTATIN LAR) IM injection 20 mg  20 mg Intramuscular Once Larey Dresser, MD       BP (!) 104/0 Comment: map  Pulse 71   Ht 5\' 5"  (1.651 m)   Wt 192 lb 9.6 oz (87.4 kg)   SpO2 97%   BMI 32.05 kg/m   MAP 93 GENERAL: Well appearing this am. NAD.  HEENT: Normal. NECK: Supple, JVP 8-9 cm. Carotids OK.  CARDIAC:  Mechanical heart sounds with LVAD hum present.  LUNGS:  CTAB, normal  effort.  ABDOMEN:  NT, ND, no HSM. No bruits or masses. +BS  LVAD exit site: Well-healed and incorporated. Dressing dry and intact. No erythema or drainage. Stabilization device present and accurately applied. Driveline dressing changed daily per sterile technique. EXTREMITIES:  Warm and dry. No cyanosis, clubbing.  1+ ankle edema.  NEUROLOGIC:  Alert & oriented x 3. Cranial nerves grossly intact. Moves all 4 extremities w/o difficulty. Affect pleasant   SKIN: Psoriasis lesions  Assessment/Plan: 1. Chronic systolic CHF: Ischemic cardiomyopathy, EF 20-25% with RV dysfunction on 2/18 echo pre-LVAD.  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  Medtronic ICD.  He has had problems with RV failure post-op.  He developed worsening renal dysfunction and  finally had to start HD in 8/18. There has been some difficulty getting adequate fluid off him, currently dialyzing 4 days/week.  Mild volume overload on exam today (this is chronic), likely due to RV failure. NYHA class II. LDH stable.  - Continue warfarin with INR goal 2-2.5, no ASA.  - He would really like to drop back to HD 3 times a week.  Will have to get this ok'd by nephrology.   2. CAD: s/p CABG.  No chest pain.  He is on atorvastatin.  3. ESRD: Tolerating HD, now able to use fistula.  4. Atrial fibrillation: Chronic.  On warfarin.  5. 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   6. HTN: MAP reasonably controlled.   7. 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 Hemoglobin stable.  8. 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  - Continue octreotide injections.  9. Squamous cell skin cancer: Neck excision site without clear margins.  He saw Dr Lisbeth Renshaw with radiation oncology, radiation was recommended.  He wants to hold off for the time being, does not think he can handle radiation as HD wears him out 4 days/week.  10. H/o TAVR: Stable on last echo.   Trevor [name] 02/10/2018

## 2018-02-11 ENCOUNTER — Other Ambulatory Visit (HOSPITAL_COMMUNITY): Payer: Self-pay | Admitting: *Deleted

## 2018-02-12 ENCOUNTER — Ambulatory Visit (HOSPITAL_COMMUNITY)
Admission: RE | Admit: 2018-02-12 | Discharge: 2018-02-12 | Disposition: A | Discharge status: Home / Self Care | Payer: Medicare Other | Source: Ambulatory Visit | Attending: Cardiology | Admitting: Cardiology

## 2018-02-12 DIAGNOSIS — Q2730 Arteriovenous malformation, site unspecified: Secondary | ICD-10-CM | POA: Insufficient documentation

## 2018-02-12 DIAGNOSIS — K922 Gastrointestinal hemorrhage, unspecified: Secondary | ICD-10-CM | POA: Diagnosis present

## 2018-02-12 MED ORDER — OCTREOTIDE ACETATE 20 MG IM KIT: 20.0000 mg | PACK | Freq: Once | INTRAMUSCULAR | Status: DC

## 2018-02-12 MED ORDER — OCTREOTIDE ACETATE 20 MG IM KIT
20.0000 mg | PACK | Freq: Once | INTRAMUSCULAR | Status: AC
  Administered 2018-02-12: 20 mg via INTRAMUSCULAR
  Filled 2018-02-12 (×2): qty 1

## 2018-02-16 ENCOUNTER — Other Ambulatory Visit (HOSPITAL_COMMUNITY): Payer: Self-pay | Admitting: Unknown Physician Specialty

## 2018-02-16 ENCOUNTER — Other Ambulatory Visit (HOSPITAL_COMMUNITY): Payer: Self-pay | Admitting: Cardiology

## 2018-02-16 DIAGNOSIS — Z95811 Presence of heart assist device: Secondary | ICD-10-CM

## 2018-02-16 DIAGNOSIS — Z7901 Long term (current) use of anticoagulants: Secondary | ICD-10-CM

## 2018-02-17 ENCOUNTER — Ambulatory Visit (HOSPITAL_COMMUNITY)
Admission: RE | Admit: 2018-02-17 | Discharge: 2018-02-17 | Disposition: A | Discharge status: Home / Self Care | Payer: Medicare Other | Source: Ambulatory Visit | Attending: Cardiology | Admitting: Cardiology

## 2018-02-17 ENCOUNTER — Ambulatory Visit (HOSPITAL_COMMUNITY): Payer: Self-pay | Admitting: Pharmacist

## 2018-02-17 DIAGNOSIS — Z7901 Long term (current) use of anticoagulants: Secondary | ICD-10-CM | POA: Insufficient documentation

## 2018-02-17 DIAGNOSIS — Z95811 Presence of heart assist device: Secondary | ICD-10-CM | POA: Insufficient documentation

## 2018-02-17 LAB — CBC
HCT: 28.4 % — ABNORMAL LOW (ref 39.0–52.0)
HEMOGLOBIN: 9.1 g/dL — AB (ref 13.0–17.0)
MCH: 30.6 pg (ref 26.0–34.0)
MCHC: 32 g/dL (ref 30.0–36.0)
MCV: 95.6 fL (ref 78.0–100.0)
Platelets: 143 10*3/uL — ABNORMAL LOW (ref 150–400)
RBC: 2.97 MIL/uL — AB (ref 4.22–5.81)
RDW: 17.1 % — ABNORMAL HIGH (ref 11.5–15.5)
WBC: 8.7 10*3/uL (ref 4.0–10.5)

## 2018-02-17 LAB — MAGNESIUM: Magnesium: 1.8 mg/dL (ref 1.7–2.4)

## 2018-02-17 LAB — PROTIME-INR
INR: 2.18
Prothrombin Time: 24.1 seconds — ABNORMAL HIGH (ref 11.4–15.2)

## 2018-02-17 NOTE — Progress Notes (Signed)
Patient presents to clinic today for drive line exit wound care. Existing VAD dressing removed and site care performed using sterile technique. Drive line exit site cleaned with saline x 2 (intolerance to chloraprep), allowed to dry, and Sorbaview dressing re-applied without biopatch due to intolerance. Exit site healed and incorporated, the velour is fully implanted at exit site. No redness, tenderness, drainage, foul odor or rash noted. Minimal dry yellow/green crusted exudate noted on driveline proximal to exit site (patient reports this normal for him).  Drive line anchor re-applied. Pt denies fever or chills.   Return in 1 week for another dressing change per standard of care. INR to be repeated in 1-2 weeks pending results per anticoagulation protocol.

## 2018-02-23 ENCOUNTER — Other Ambulatory Visit: Payer: Self-pay

## 2018-02-23 ENCOUNTER — Inpatient Hospital Stay (HOSPITAL_COMMUNITY)
Admission: EM | Admit: 2018-02-23 | Discharge: 2018-03-05 | DRG: 811 | Disposition: A | Discharge status: Home / Self Care | Payer: Medicare Other | Attending: Internal Medicine | Admitting: Internal Medicine

## 2018-02-23 ENCOUNTER — Encounter (HOSPITAL_COMMUNITY): Payer: Self-pay | Admitting: Emergency Medicine

## 2018-02-23 ENCOUNTER — Emergency Department (HOSPITAL_COMMUNITY): Payer: Medicare Other

## 2018-02-23 DIAGNOSIS — D631 Anemia in chronic kidney disease: Secondary | ICD-10-CM | POA: Diagnosis present

## 2018-02-23 DIAGNOSIS — I5023 Acute on chronic systolic (congestive) heart failure: Secondary | ICD-10-CM | POA: Diagnosis present

## 2018-02-23 DIAGNOSIS — C4442 Squamous cell carcinoma of skin of scalp and neck: Secondary | ICD-10-CM | POA: Diagnosis present

## 2018-02-23 DIAGNOSIS — Z794 Long term (current) use of insulin: Secondary | ICD-10-CM

## 2018-02-23 DIAGNOSIS — I5022 Chronic systolic (congestive) heart failure: Secondary | ICD-10-CM

## 2018-02-23 DIAGNOSIS — J069 Acute upper respiratory infection, unspecified: Secondary | ICD-10-CM | POA: Diagnosis present

## 2018-02-23 DIAGNOSIS — Z95811 Presence of heart assist device: Secondary | ICD-10-CM | POA: Diagnosis not present

## 2018-02-23 DIAGNOSIS — E1142 Type 2 diabetes mellitus with diabetic polyneuropathy: Secondary | ICD-10-CM | POA: Diagnosis present

## 2018-02-23 DIAGNOSIS — D649 Anemia, unspecified: Secondary | ICD-10-CM

## 2018-02-23 DIAGNOSIS — Z4509 Encounter for adjustment and management of other cardiac device: Secondary | ICD-10-CM

## 2018-02-23 DIAGNOSIS — I482 Chronic atrial fibrillation: Secondary | ICD-10-CM | POA: Diagnosis present

## 2018-02-23 DIAGNOSIS — E119 Type 2 diabetes mellitus without complications: Secondary | ICD-10-CM

## 2018-02-23 DIAGNOSIS — I4510 Unspecified right bundle-branch block: Secondary | ICD-10-CM | POA: Diagnosis present

## 2018-02-23 DIAGNOSIS — K922 Gastrointestinal hemorrhage, unspecified: Secondary | ICD-10-CM

## 2018-02-23 DIAGNOSIS — R195 Other fecal abnormalities: Secondary | ICD-10-CM | POA: Diagnosis not present

## 2018-02-23 DIAGNOSIS — K5520 Angiodysplasia of colon without hemorrhage: Secondary | ICD-10-CM | POA: Diagnosis not present

## 2018-02-23 DIAGNOSIS — K5521 Angiodysplasia of colon with hemorrhage: Secondary | ICD-10-CM | POA: Diagnosis present

## 2018-02-23 DIAGNOSIS — Y92230 Patient room in hospital as the place of occurrence of the external cause: Secondary | ICD-10-CM

## 2018-02-23 DIAGNOSIS — J209 Acute bronchitis, unspecified: Secondary | ICD-10-CM | POA: Diagnosis present

## 2018-02-23 DIAGNOSIS — J440 Chronic obstructive pulmonary disease with acute lower respiratory infection: Secondary | ICD-10-CM | POA: Diagnosis present

## 2018-02-23 DIAGNOSIS — Z833 Family history of diabetes mellitus: Secondary | ICD-10-CM

## 2018-02-23 DIAGNOSIS — I255 Ischemic cardiomyopathy: Secondary | ICD-10-CM | POA: Diagnosis present

## 2018-02-23 DIAGNOSIS — M898X9 Other specified disorders of bone, unspecified site: Secondary | ICD-10-CM | POA: Diagnosis present

## 2018-02-23 DIAGNOSIS — I480 Paroxysmal atrial fibrillation: Secondary | ICD-10-CM | POA: Diagnosis present

## 2018-02-23 DIAGNOSIS — N186 End stage renal disease: Secondary | ICD-10-CM | POA: Diagnosis present

## 2018-02-23 DIAGNOSIS — D122 Benign neoplasm of ascending colon: Secondary | ICD-10-CM | POA: Diagnosis present

## 2018-02-23 DIAGNOSIS — Z8249 Family history of ischemic heart disease and other diseases of the circulatory system: Secondary | ICD-10-CM

## 2018-02-23 DIAGNOSIS — N2581 Secondary hyperparathyroidism of renal origin: Secondary | ICD-10-CM | POA: Diagnosis present

## 2018-02-23 DIAGNOSIS — D500 Iron deficiency anemia secondary to blood loss (chronic): Principal | ICD-10-CM | POA: Diagnosis present

## 2018-02-23 DIAGNOSIS — I132 Hypertensive heart and chronic kidney disease with heart failure and with stage 5 chronic kidney disease, or end stage renal disease: Secondary | ICD-10-CM | POA: Diagnosis present

## 2018-02-23 DIAGNOSIS — Z952 Presence of prosthetic heart valve: Secondary | ICD-10-CM

## 2018-02-23 DIAGNOSIS — D126 Benign neoplasm of colon, unspecified: Secondary | ICD-10-CM | POA: Diagnosis not present

## 2018-02-23 DIAGNOSIS — I50814 Right heart failure due to left heart failure: Secondary | ICD-10-CM | POA: Diagnosis present

## 2018-02-23 DIAGNOSIS — K5730 Diverticulosis of large intestine without perforation or abscess without bleeding: Secondary | ICD-10-CM | POA: Diagnosis present

## 2018-02-23 DIAGNOSIS — Z951 Presence of aortocoronary bypass graft: Secondary | ICD-10-CM

## 2018-02-23 DIAGNOSIS — E1122 Type 2 diabetes mellitus with diabetic chronic kidney disease: Secondary | ICD-10-CM | POA: Diagnosis present

## 2018-02-23 DIAGNOSIS — I2510 Atherosclerotic heart disease of native coronary artery without angina pectoris: Secondary | ICD-10-CM | POA: Diagnosis present

## 2018-02-23 DIAGNOSIS — T8089XA Other complications following infusion, transfusion and therapeutic injection, initial encounter: Secondary | ICD-10-CM | POA: Diagnosis not present

## 2018-02-23 DIAGNOSIS — Q2733 Arteriovenous malformation of digestive system vessel: Secondary | ICD-10-CM | POA: Diagnosis not present

## 2018-02-23 DIAGNOSIS — K429 Umbilical hernia without obstruction or gangrene: Secondary | ICD-10-CM | POA: Diagnosis present

## 2018-02-23 DIAGNOSIS — Z7901 Long term (current) use of anticoagulants: Secondary | ICD-10-CM

## 2018-02-23 DIAGNOSIS — D125 Benign neoplasm of sigmoid colon: Secondary | ICD-10-CM | POA: Diagnosis present

## 2018-02-23 DIAGNOSIS — Z992 Dependence on renal dialysis: Secondary | ICD-10-CM

## 2018-02-23 DIAGNOSIS — D123 Benign neoplasm of transverse colon: Secondary | ICD-10-CM | POA: Diagnosis present

## 2018-02-23 DIAGNOSIS — Z95810 Presence of automatic (implantable) cardiac defibrillator: Secondary | ICD-10-CM

## 2018-02-23 DIAGNOSIS — Z955 Presence of coronary angioplasty implant and graft: Secondary | ICD-10-CM

## 2018-02-23 DIAGNOSIS — R0603 Acute respiratory distress: Secondary | ICD-10-CM

## 2018-02-23 LAB — BASIC METABOLIC PANEL
ANION GAP: 12 (ref 5–15)
BUN: 15 mg/dL (ref 6–20)
CHLORIDE: 91 mmol/L — AB (ref 101–111)
CO2: 27 mmol/L (ref 22–32)
Calcium: 8.4 mg/dL — ABNORMAL LOW (ref 8.9–10.3)
Creatinine, Ser: 2.33 mg/dL — ABNORMAL HIGH (ref 0.61–1.24)
GFR calc Af Amer: 30 mL/min — ABNORMAL LOW (ref >60)
GFR calc non Af Amer: 26 mL/min — ABNORMAL LOW (ref >60)
Glucose, Bld: 295 mg/dL — ABNORMAL HIGH (ref 65–99)
POTASSIUM: 3.6 mmol/L (ref 3.5–5.1)
Sodium: 130 mmol/L — ABNORMAL LOW (ref 135–145)

## 2018-02-23 LAB — I-STAT TROPONIN, ED: Troponin i, poc: 0.06 ng/mL (ref 0.00–0.08)

## 2018-02-23 LAB — CBC
HEMATOCRIT: 23.5 % — AB (ref 39.0–52.0)
HEMOGLOBIN: 7.7 g/dL — AB (ref 13.0–17.0)
MCH: 31.6 pg (ref 26.0–34.0)
MCHC: 32.8 g/dL (ref 30.0–36.0)
MCV: 96.3 fL (ref 78.0–100.0)
Platelets: 202 10*3/uL (ref 150–400)
RBC: 2.44 MIL/uL — ABNORMAL LOW (ref 4.22–5.81)
RDW: 17.7 % — AB (ref 11.5–15.5)
WBC: 13.5 10*3/uL — ABNORMAL HIGH (ref 4.0–10.5)

## 2018-02-23 LAB — LACTATE DEHYDROGENASE: LDH: 257 U/L — AB (ref 98–192)

## 2018-02-23 LAB — PREPARE RBC (CROSSMATCH)

## 2018-02-23 LAB — PROTIME-INR
INR: 2.93
Prothrombin Time: 30.3 seconds — ABNORMAL HIGH (ref 11.4–15.2)

## 2018-02-23 MED ORDER — FERRIC CITRATE 1 GM 210 MG(FE) PO TABS
420.0000 mg | ORAL_TABLET | Freq: Three times a day (TID) | ORAL | Status: DC
  Administered 2018-02-24 – 2018-02-25 (×3): 420 mg via ORAL
  Filled 2018-02-23 (×7): qty 2

## 2018-02-23 MED ORDER — ATORVASTATIN CALCIUM 40 MG PO TABS
40.0000 mg | ORAL_TABLET | Freq: Every day | ORAL | Status: DC
Start: 2018-02-24 — End: 2018-03-05
  Administered 2018-02-24 – 2018-03-04 (×9): 40 mg via ORAL
  Filled 2018-02-23 (×10): qty 1

## 2018-02-23 MED ORDER — DOXYCYCLINE HYCLATE 100 MG PO TABS
100.0000 mg | ORAL_TABLET | Freq: Two times a day (BID) | ORAL | Status: DC
  Administered 2018-02-23 – 2018-03-01 (×11): 100 mg via ORAL
  Filled 2018-02-23 (×11): qty 1

## 2018-02-23 MED ORDER — DOCUSATE SODIUM 100 MG PO CAPS
100.0000 mg | ORAL_CAPSULE | Freq: Every day | ORAL | Status: DC
Start: 2018-02-24 — End: 2018-03-05
  Administered 2018-02-24 – 2018-03-05 (×9): 100 mg via ORAL
  Filled 2018-02-23 (×9): qty 1

## 2018-02-23 MED ORDER — RENA-VITE PO TABS
1.0000 | ORAL_TABLET | Freq: Every day | ORAL | Status: DC
  Administered 2018-02-24 – 2018-03-04 (×10): 1 via ORAL
  Filled 2018-02-23 (×10): qty 1

## 2018-02-23 MED ORDER — ACETAMINOPHEN 325 MG PO TABS
650.0000 mg | ORAL_TABLET | ORAL | Status: DC | PRN
  Administered 2018-02-24 – 2018-03-02 (×4): 650 mg via ORAL
  Filled 2018-02-23 (×4): qty 2

## 2018-02-23 MED ORDER — LEVOTHYROXINE SODIUM 25 MCG PO TABS
25.0000 ug | ORAL_TABLET | Freq: Every day | ORAL | Status: DC
Start: 2018-02-24 — End: 2018-03-05
  Administered 2018-02-24 – 2018-03-05 (×10): 25 ug via ORAL
  Filled 2018-02-23 (×10): qty 1

## 2018-02-23 MED ORDER — BUSPIRONE HCL 5 MG PO TABS
5.0000 mg | ORAL_TABLET | Freq: Two times a day (BID) | ORAL | Status: DC
  Administered 2018-02-23 – 2018-03-05 (×19): 5 mg via ORAL
  Filled 2018-02-23 (×21): qty 1

## 2018-02-23 MED ORDER — TRAZODONE HCL 50 MG PO TABS
100.0000 mg | ORAL_TABLET | Freq: Every evening | ORAL | Status: DC | PRN
  Administered 2018-02-23 – 2018-03-04 (×9): 100 mg via ORAL
  Filled 2018-02-23 (×9): qty 2

## 2018-02-23 MED ORDER — ALBUTEROL SULFATE HFA 108 (90 BASE) MCG/ACT IN AERS: 2.0000 | INHALATION_SPRAY | RESPIRATORY_TRACT | Status: DC | PRN

## 2018-02-23 MED ORDER — SODIUM CHLORIDE 0.9 % IV SOLN
Freq: Once | INTRAVENOUS | Status: AC
  Administered 2018-02-24: 04:00:00 via INTRAVENOUS

## 2018-02-23 MED ORDER — SILDENAFIL CITRATE 20 MG PO TABS
40.0000 mg | ORAL_TABLET | Freq: Three times a day (TID) | ORAL | Status: DC
  Administered 2018-02-23 – 2018-03-05 (×29): 40 mg via ORAL
  Filled 2018-02-23 (×31): qty 2

## 2018-02-23 MED ORDER — CITALOPRAM HYDROBROMIDE 20 MG PO TABS
20.0000 mg | ORAL_TABLET | Freq: Every day | ORAL | Status: DC
  Administered 2018-02-24 – 2018-03-05 (×9): 20 mg via ORAL
  Filled 2018-02-23 (×8): qty 1
  Filled 2018-02-23: qty 2

## 2018-02-23 MED ORDER — SODIUM CHLORIDE 0.9 % IV SOLN
Freq: Once | INTRAVENOUS | Status: AC
  Administered 2018-02-23: 20:00:00 via INTRAVENOUS

## 2018-02-23 MED ORDER — INSULIN GLARGINE 100 UNIT/ML ~~LOC~~ SOLN
25.0000 [IU] | Freq: Every day | SUBCUTANEOUS | Status: DC
  Administered 2018-02-23 – 2018-02-24 (×2): 25 [IU] via SUBCUTANEOUS
  Filled 2018-02-23 (×2): qty 0.25

## 2018-02-23 MED ORDER — ONDANSETRON HCL 4 MG/2ML IJ SOLN: 4.0000 mg | Freq: Four times a day (QID) | INTRAMUSCULAR | Status: DC | PRN

## 2018-02-23 MED ORDER — CALCITRIOL 0.25 MCG PO CAPS
0.5000 ug | ORAL_CAPSULE | ORAL | Status: DC
  Administered 2018-02-25 – 2018-03-04 (×3): 0.5 ug via ORAL
  Filled 2018-02-23 (×3): qty 2

## 2018-02-23 MED ORDER — MAGNESIUM OXIDE 400 (241.3 MG) MG PO TABS
400.0000 mg | ORAL_TABLET | Freq: Every day | ORAL | Status: DC
  Administered 2018-02-24 – 2018-03-05 (×9): 400 mg via ORAL
  Filled 2018-02-23 (×10): qty 1

## 2018-02-23 MED ORDER — PANTOPRAZOLE SODIUM 40 MG PO TBEC
40.0000 mg | DELAYED_RELEASE_TABLET | Freq: Every day | ORAL | Status: DC
  Administered 2018-02-24 – 2018-03-05 (×9): 40 mg via ORAL
  Filled 2018-02-23 (×9): qty 1

## 2018-02-23 MED ORDER — BUDESONIDE 0.25 MG/2ML IN SUSP
0.2500 mg | Freq: Two times a day (BID) | RESPIRATORY_TRACT | Status: DC
  Administered 2018-02-24 – 2018-03-05 (×17): 0.25 mg via RESPIRATORY_TRACT
  Filled 2018-02-23 (×19): qty 2

## 2018-02-23 MED ORDER — TRAMADOL HCL 50 MG PO TABS
50.0000 mg | ORAL_TABLET | Freq: Four times a day (QID) | ORAL | Status: DC | PRN
  Administered 2018-02-23 – 2018-02-24 (×2): 50 mg via ORAL
  Filled 2018-02-23 (×2): qty 1

## 2018-02-23 MED ORDER — GABAPENTIN 300 MG PO CAPS
300.0000 mg | ORAL_CAPSULE | Freq: Two times a day (BID) | ORAL | Status: DC
  Administered 2018-02-23 – 2018-03-05 (×19): 300 mg via ORAL
  Filled 2018-02-23 (×19): qty 1

## 2018-02-23 NOTE — ED Triage Notes (Signed)
Pt c/o shortness of breath, weakness, bilateral hand/feet numbness after dialysis today. Pt also reports a productive cough x 3 days. Dialysis M/W/F/Sat. Pt has LVAD.

## 2018-02-23 NOTE — H&P (Addendum)
Advanced Heart Failure VAD Team H&P Note  PCP-Cardiologist: No primary care provider on file.   Subjective:    Trevor [name] is a 72 y.o. male with a history of CAD s/p CABG x 4 2010, OSA, AS with TAVR 2015, , ESRD on HD, DM2, chronic atrial fibrillation, asthma, and ischemic cardiomyopathy with Medtronic ICD.  S/p HMII LVAD for Destination therapy on 12/14/2015.   Over the year of 2018 pt had gradually worsening renal function, ultimately ended up on HD 06/2017. Required increase HD to 4 days/week.   Last seen in [facility] 02/10/18. Was overall stable. Multiple PI events noted on HD days. Otherwise felt well on non-HD days. Radiation recommended for squamous cell cancer on his neck, which he requested to hold off on for now.   Pt paged VAD pager this evening with worsening SOB since dialysis this am. Feels "awful". Very SOB with numbness and tingling in his hands and feet. Has had production on top of chronic cough since Friday. He has felt completely wiped out after dialysis Friday, Saturday, and much worse today after dialysis.  Also c/o lightheadedness and ? Orthopnea. No CP. Has been taking all medications as directed.   LVAD INTERROGATION:  HeartMate 2 LVAD:   Flow 6.8 liters/min, speed 9600, power 7.0, PI 3.0.    Objective:    Vital Signs:   HR 74 MAP 82 Pulse Ox 100%  Mean arterial Pressure 82  Intake/Output:  No intake or output data in the 24 hours ending 02/23/18 1737   Physical Exam    General:  Fatigued appearing. No resp difficulty HEENT: Normal Neck: supple. JVP ~7-8 cm. Carotids 2+ bilat; no bruits. No lymphadenopathy or thyromegaly appreciated. Cor: Mechanical heart sounds with LVAD hum present. Lungs: Diminished. Abdomen: soft, nontender, nondistended. No hepatosplenomegaly. No bruits or masses. Good bowel sounds. Driveline: C/D/I; securement device intact and driveline incorporated Extremities: no cyanosis, clubbing, or rash. Trace ankle edema.  Neuro:  alert & orientedx3, cranial nerves grossly intact. moves all 4 extremities w/o difficulty. Affect pleasant  Telemetry   Not currently connected  EKG    A fib (Chronic) 73 bpm, personally reviewed  Labs   Basic Metabolic Panel: Recent Labs  Lab 02/17/18 0842  MG 1.8    Liver Function Tests: No results for input(s): AST, ALT, ALKPHOS, BILITOT, PROT, ALBUMIN in the last 168 hours. No results for input(s): LIPASE, AMYLASE in the last 168 hours. No results for input(s): AMMONIA in the last 168 hours.  CBC: Recent Labs  Lab 02/17/18 0842  WBC 8.7  HGB 9.1*  HCT 28.4*  MCV 95.6  PLT 143*    INR: Recent Labs  Lab 02/17/18 0842  INR 2.18    Other results:  EKG:    Imaging    No results found.   Medications:     Scheduled Medications:   Infusions:   PRN Medications:     Patient Profile   DANDRE SISLER is a 72 y.o. male with a history of CAD s/p CABG x 4 2010, OSA, AS with TAVR 2015, , ESRD on HD, DM2, chronic atrial fibrillation, asthma, and ischemic cardiomyopathy with Medtronic ICD.  S/p HMII LVAD for Destination therapy on 12/14/2015.   Presented to [facility] 02/23/18 with worsening SOB and malaise s/p HD.   Assessment/Plan:    1. SOB and fatigue with symptomatic anemia - WBC up to 13 and Hgb down to 7.7.  - He feels wiped out and more SOB after HD days,  and slowly improves until next dialysis. ? If his dry weight is being set too low.  2. Acute on chronic systolic CHF: Ischemic cardiomyopathy, EF 20-25% with RV dysfunction on 2/18 echo pre-LVAD.  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  Medtronic ICD.  He has had problems with RV failure post-op.  He developed worsening renal dysfunction and finally had to start HD in 8/18. There has been some difficulty getting adequate fluid off him, currently dialyzing 4 days/week.  - He is much more SOB than usual, despite volume status looking stable after HD today.   - Continue warfarin with INR goal 2-2.5, no ASA. INR  pending. - Multiple nephrologists have confirmed he is poor candidate for HD 3 times a week due to CHF and rapid fluid re-accumulation. Will need to continue 4 times a week.  3. CAD: s/p CABG.  - No s/s of ischemia.    - He is on atorvastatin.  4. ESRD: Tolerating HD, now able to use fistula.  5. Atrial fibrillation: Chronic.  - Rate controlled. On coumadin. INR pending 6. RV failure:  - Continue revatio 40 mg TID.  7. HTN:  - MAP well controlled currently.  8. 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  - Worsened on labs today with Hgb 10 -> 9.1 -> 7.7. No overt signs of bleeding.  Check hemoccult.   - Will admit to observation and give 1 unit PRBCs. May need GI to see.  9. 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  - Denies overt bleeding. Hgb down to 7.7 (Down 2.3 grams in ~ 2 weeks) - Continue octreotide injections.  10. Squamous cell skin cancer: Neck excision site without clear margins.  He saw Dr Lisbeth Renshaw with radiation oncology, radiation was recommended.  - Holding off on radiation. He does not think he can handle radiation as well as HD 4 days/week.  11. H/o TAVR:  - Stable on recent echo.  12. Acute bronchitis/URI - start doxy 100 bid  Will admit to Stepdown with symptomatic anemia. Unclear source, but history of Colonic AVMs and GI bleeding likely culprit vs Acute on chronic anemia with HD and worsening overall functional status.   I reviewed the LVAD parameters from today, and compared the results to the patient's prior recorded data.  No programming changes were made.  The LVAD is functioning within specified parameters.  The patient performs LVAD self-test daily.  LVAD interrogation was negative for any significant power changes, alarms or PI events/speed drops.  LVAD equipment check completed and is in good working order.  Back-up equipment present.   LVAD education done on emergency  procedures and precautions and reviewed exit site care.  Length of Stay: 0  Annamaria Helling 02/23/2018, 5:24 PM  VAD Team --- VAD ISSUES ONLY--- Pager [phone] ([place])  Advanced Heart Failure Team  Pager [phone] (M-F; 7a - 4p)  Please contact [facility] for night-coverage after hours (4p -7a ) and weekends on [email or web address]  Patient seen and examined with the above-signed Advanced Practice Provider and/or Housestaff. I personally reviewed laboratory data, imaging studies and relevant notes. I independently examined the patient and formulated the important aspects of the plan. I have edited the note to reflect any of my changes or salient points. I have personally discussed the plan with the patient and/or family.  He is tolerating HD poorly and feels weaker and weaker after each session. Now  with profound fatigue and persistent productive cough with yellow sputum. In ER found to have significant anemia. Denies overt bleeding. No melena. MAPs and VAD parameters stable. 1-2+ LE edema on exam.  Will discuss with Renal about the possibility of switching HD to 3x per week (versus 4). We also discussed fact that if/when dialysis becomes intolerable; comfort care can be an option. D/w ER physician. Dr. Ellender Hose. Will admit for transfusion. Start doxy for URI.   Glori Bickers, MD  9:53 PM

## 2018-02-23 NOTE — ED Provider Notes (Signed)
[facility] EMERGENCY DEPARTMENT Provider Note   CSN: [id number] Arrival date & time: 02/23/18  1630     History   Chief Complaint Chief Complaint  Patient presents with  . Shortness of Breath    HPI Trevor [name] is a 72 y.o. male.  HPI   72 year old male with extensive past medical history including severe heart disease with CHF status post LVAD for destination therapy here with generalized weakness.  Patient reportedly has been progressively declining over the last 2 weeks.  He has been increasingly short of breath, tired, and easily fatigued.  He was seen in heart failure clinic today, and sent to the ED for admission.  Patient has a history of anemia but was noted to be more anemic on lab work in clinic.  Has history of bleeds from AVMs as well as anemia of chronic disease.  Currently, patient complains of generalized weakness and fatigue.  No chest pain currently.  LVAD team aware of patient's arrival to the ED.  Denies any known melena or hematochezia.  He has been taking his Coumadin as per LVAD guidelines.  He has not had any hematemesis.  Past Medical History:  Diagnosis Date  . AICD (automatic cardioverter/defibrillator) present   . Asthma   . AVM (arteriovenous malformation) of colon 07/07/2017  . CHF (congestive heart failure) ([place])   . Diabetes ([place])   . ESRF (end stage renal failure) ([place]) 07/07/2017   pt receiving hemodialysis Monday- Wednesday-friday, sometimes Saturday  . Kidney disease   . LVAD (left ventricular assist device) present ([place]) 12/2016  . Permanent atrial fibrillation ([place]) 07/07/2017  . Presence of permanent cardiac pacemaker    AICD  . Sleep apnea     Patient Active Problem List   Diagnosis Date Noted  . Cellulitis 12/03/2017  . 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 Chronic systolic heart failure ([place]) 09/08/2017  . ESRD (end stage renal disease) ([place]) 07/07/2017  . AVM (arteriovenous malformation) of colon  07/07/2017  . Thrombocytopenia ([place]) 07/07/2017  . Permanent atrial fibrillation ([place]) 07/07/2017  . Anemia of chronic disease 07/07/2017  . CHF (congestive heart failure), NYHA class IV ([place]) 06/13/2017  . AVM (arteriovenous malformation) of colon with hemorrhage   . Symptomatic anemia 02/10/2017  . Type 2 diabetes mellitus ([place]) 01/17/2017  . COPD (chronic obstructive pulmonary disease) ([place]) 01/17/2017  . Presence of left ventricular assist device (LVAD) ([place])   . Cardiorenal syndrome with renal failure 11/28/2016  . Cardiomyopathy, ischemic 11/27/2016  . Coronary artery disease 11/27/2016  . Status post aortic valve replacement 11/27/2016  . Asthma, chronic 11/27/2016  . OSA (obstructive sleep apnea) 11/27/2016    Past Surgical History:  Procedure Laterality Date  . AORTIC VALVE REPLACEMENT  2015   [place]  . APPLICATION OF A-CELL OF EXTREMITY Left 11/20/2017   Procedure: APPLICATION OF A-CELL;  Surgeon: Wallace Going, DO;  Location: [facility];  Service: Plastics;  Laterality: Left;  . AV FISTULA PLACEMENT Left 06/30/2017   Procedure: CREATION of LEFT ARM Brachiocephalic Fistula;  Surgeon: Conrad Riverside, MD;  Location: [facility];  Service: Vascular;  Laterality: Left;  . CARDIAC CATHETERIZATION N/A 12/02/2016   Procedure: Right Heart Cath;  Surgeon: Larey Dresser, MD;  Location: [facility];  Service: Cardiovascular;  Laterality: N/A;  . COLONOSCOPY N/A 02/14/2017   Procedure: COLONOSCOPY;  Surgeon: Milus Banister, MD;  Location: [facility];  Service: Endoscopy;  Laterality: N/A;  .  CORONARY ANGIOPLASTY WITH STENT PLACEMENT  2013   in [place]  . CORONARY ARTERY BYPASS GRAFT  2010   in [place]  . DIALYSIS/PERMA CATHETER INSERTION  06/30/2017   Procedure: INSERTION Dialysis Catheter;  Surgeon: Conrad [place], MD;  Location: [facility];  Service: Vascular;;  . IABP INSERTION N/A 12/11/2016   Procedure: IABP Insertion;  Surgeon: Larey Dresser, MD;  Location: [facility];   Service: Cardiovascular;  Laterality: N/A;  . INSERTION OF IMPLANTABLE LEFT VENTRICULAR ASSIST DEVICE N/A 12/13/2016   Procedure: INSERTION OF IMPLANTABLE LEFT VENTRICULAR ASSIST DEVICE;  Surgeon: Ivin Poot, MD;  Location: [facility];  Service: Open Heart Surgery;  Laterality: N/A;  HEARTMATE II  NITRIC OXIDE  . IR FLUORO GUIDE CV LINE LEFT  06/17/2017  . IR US GUIDE VASC ACCESS LEFT  06/17/2017  . MASS EXCISION Left 11/20/2017   Procedure: EXCISION OF LEFT NECK AND RIGHT LEG SKIN CANCER WITH A CELL PLACEMENT;  Surgeon: Wallace Going, DO;  Location: [facility];  Service: Plastics;  Laterality: Left;  Marland Kitchen MASS EXCISION Left 11/26/2017   Procedure: EXPLORATION AND EXCISION OF LEFT NECK;  Surgeon: Wallace Going, DO;  Location: [facility];  Service: Plastics;  Laterality: Left;  . RIGHT HEART CATH N/A 12/11/2016   Procedure: Right Heart Cath;  Surgeon: Larey Dresser, MD;  Location: [facility];  Service: Cardiovascular;  Laterality: N/A;  . RIGHT HEART CATH N/A 03/27/2017   Procedure: Right Heart Cath;  Surgeon: Larey Dresser, MD;  Location: [facility];  Service: Cardiovascular;  Laterality: N/A;  . RIGHT HEART CATH N/A 06/13/2017   Procedure: RIGHT HEART CATH;  Surgeon: Jolaine Artist, MD;  Location: [facility];  Service: Cardiovascular;  Laterality: N/A;  . TEE WITHOUT CARDIOVERSION N/A 12/13/2016   Procedure: TRANSESOPHAGEAL ECHOCARDIOGRAM (TEE);  Surgeon: Ivin Poot, MD;  Location: [facility];  Service: Open Heart Surgery;  Laterality: N/A;  . TRICUSPID VALVE REPLACEMENT N/A 12/13/2016   Procedure: TRICUSPID VALVE REPAIR WITH EDWARDS MC 3 TRICUSPID ANNULOPLASTY RING MODEL 4900 SIZE T 28;  Surgeon: Ivin Poot, MD;  Location: [facility];  Service: Open Heart Surgery;  Laterality: N/A;        Home Medications    Prior to Admission medications   Medication Sig Start Date End Date Taking? Authorizing Provider  albuterol (PROVENTIL HFA;VENTOLIN HFA) 108 (90 Base) MCG/ACT  inhaler Inhale 2 puffs into the lungs every 4 (four) hours as needed for wheezing or shortness of breath. 02/10/18   Larey Dresser, MD  albuterol (PROVENTIL) (2.5 MG/3ML) 0.083% nebulizer solution Take 3 mLs (2.5 mg total) by nebulization every 4 (four) hours. And as needed 11/27/16   [name], MD  atorvastatin (LIPITOR) 40 MG tablet TAKE 1 TABLET(40 MG) BY MOUTH DAILY Patient taking differently: Take 40 mg by mouth daily at 6 PM.  09/02/17   Larey Dresser, MD  busPIRone (BUSPAR) 5 MG tablet Take 1 tablet (5 mg total) by mouth 2 (two) times daily. 01/22/18   Bensimhon, Shaune Pascal, MD  calcitRIOL (ROCALTROL) 0.5 MCG capsule Take 1 capsule (0.5 mcg total) by mouth every other day. Patient taking differently: Take 0.5 mcg by mouth every Monday, Wednesday, and Friday with hemodialysis.  07/08/17   Bensimhon, Shaune Pascal, MD  docusate sodium (COLACE) 100 MG capsule Take 1 capsule (100 mg total) by mouth 2 (two) times daily. Patient taking differently: Take 100 mg daily by mouth.  04/03/17   Larey Dresser, MD  ferric citrate (AURYXIA) 1 GM 210 MG(Fe) tablet Take 420 mg by mouth 3 (three) times daily with meals.    [provider]  fluticasone (FLOVENT HFA) 110 MCG/ACT inhaler Inhale 2 puffs into the lungs 2 (two) times daily. 02/10/18   Larey Dresser, MD  gabapentin (NEURONTIN) 600 MG tablet Take 0.5 tablets (300 mg total) by mouth 2 (two) times daily. 07/23/17   Larey Dresser, MD  insulin glargine (LANTUS) 100 unit/mL SOPN Inject 0.25 mLs (25 Units total) into the skin at bedtime. 12/18/17   Clegg, Amy D, NP  levothyroxine (SYNTHROID, LEVOTHROID) 25 MCG tablet Take 1 tablet (25 mcg total) by mouth daily before breakfast. 10/02/17   Larey Dresser, MD  magnesium oxide (MAG-OX) 400 MG tablet Take 400 mg by mouth daily.    [provider]  multivitamin (RENA-VIT) TABS tablet Take 1 tablet by mouth at bedtime. 09/02/17   Larey Dresser, MD  pantoprazole (PROTONIX) 40 MG tablet Take  1 tablet (40 mg total) by mouth daily. 08/11/17   Clegg, Amy D, NP  sildenafil (REVATIO) 20 MG tablet Take 2 tablets (40 mg total) 3 (three) times daily by mouth. 09/11/17   Larey Dresser, MD  traMADol (ULTRAM) 50 MG tablet Take 1 tablet (50 mg total) by mouth every 6 (six) hours as needed. TAKE 1 TABLET BY MOUTH EVERY 6 HOURS AS NEEDED FOR MODERATE PAIN 01/01/18   Larey Dresser, MD  traZODone (DESYREL) 100 MG tablet TAKE 1 TABLET BY MOUTH EVERY NIGHT AT BEDTIME AS NEEDED FOR SLEEP 02/16/18   Larey Dresser, MD  warfarin (COUMADIN) 5 MG tablet Take 1 and 1/2 tablets (7.5 mg) daily except 2 tablets (10 mg) on Tuesday and Thursday.    [provider]    Family History Family History  Problem Relation Age of Onset  . Heart failure Father   . Heart attack Father   . Healthy Mother   . Diabetes Paternal Grandfather     Social History Social History   Tobacco Use  . Smoking status: Never Smoker  . Smokeless tobacco: Never Used  Substance Use Topics  . Alcohol use: No  . Drug use: No     Allergies   Patient has no known allergies.   Review of Systems Review of Systems  Constitutional: Positive for fatigue.  Respiratory: Positive for shortness of breath.   Neurological: Positive for weakness.  All other systems reviewed and are negative.    Physical Exam Updated Vital Signs There were no vitals taken for this visit.  Physical Exam  Constitutional: He is oriented to person, place, and time. He appears well-developed and well-nourished. No distress.  HENT:  Head: Normocephalic and atraumatic.  Eyes: Conjunctivae are normal.  Neck: Neck supple.  Cardiovascular: Normal rate and normal heart sounds. Exam reveals no friction rub.  No murmur heard. Underlying heart sounds appreciated. Audible hum from LVAD.  Pulmonary/Chest: Effort normal and breath sounds normal. No respiratory distress. He has no wheezes. He has no rales.  Abdominal: He exhibits no distension.    Drive line site c/d/i.  Musculoskeletal: He exhibits no edema.  Neurological: He is alert and oriented to person, place, and time. He exhibits normal muscle tone.  Skin: Skin is warm. Capillary refill takes less than 2 seconds.  Psychiatric: He has a normal mood and affect.  Nursing note and vitals reviewed.    ED Treatments / Results  Labs (  all labs ordered are listed, but only abnormal results are displayed) Labs Reviewed  BASIC METABOLIC PANEL - Abnormal; Notable for the following components:      Result Value   Sodium 130 (*)    Chloride 91 (*)    Glucose, Bld 295 (*)    Creatinine, Ser 2.33 (*)    Calcium 8.4 (*)    GFR calc non Af Amer 26 (*)    GFR calc Af Amer 30 (*)    All other components within normal limits  CBC - Abnormal; Notable for the following components:   WBC 13.5 (*)    RBC 2.44 (*)    Hemoglobin 7.7 (*)    HCT 23.5 (*)    RDW 17.7 (*)    All other components within normal limits  PROTIME-INR - Abnormal; Notable for the following components:   Prothrombin Time 30.3 (*)    All other components within normal limits  LACTATE DEHYDROGENASE - Abnormal; Notable for the following components:   LDH 257 (*)    All other components within normal limits  I-STAT TROPONIN, ED  TYPE AND SCREEN  PREPARE RBC (CROSSMATCH)  PREPARE RBC (CROSSMATCH)    EKG EKG Interpretation  Date/Time:  Monday February 23 2018 16:41:59 EDT Ventricular Rate:  73 PR Interval:    QRS Duration: 158 QT Interval:  480 QTC Calculation: 528 R Axis:   -116 Text Interpretation:  Atrial fibrillation Right bundle branch block Anteroseptal infarct , age undetermined Abnormal ECG No significant change since last tracing Confirmed by Duffy Bruce [phone]) on 02/23/2018 6:09:16 PM   Radiology Dg Chest 2 View  Result Date: 02/23/2018 CLINICAL DATA:  Shortness of breath. EXAM: CHEST - 2 VIEW COMPARISON:  11/24/2017. FINDINGS: Cardiomegaly, ventricular assist device, tricuspid and aortic  valve replacements, along with AICD all in good position. No consolidation or edema. Minimal blunting LEFT CP angle. Previous median sternotomy for CABG. No osseous findings. IMPRESSION: Stable chest. Electronically Signed   By: Staci Righter M.D.   On: 02/23/2018 17:54    Procedures .Critical Care Performed by: Duffy Bruce, MD Authorized by: Duffy Bruce, MD   Critical care provider statement:    Critical care time (minutes):  35   Critical care time was exclusive of:  Separately billable procedures and treating other patients and teaching time   Critical care was necessary to treat or prevent imminent or life-threatening deterioration of the following conditions:  Circulatory failure and cardiac failure   Critical care was time spent personally by me on the following activities:  Development of treatment plan with patient or surrogate, discussions with consultants, evaluation of patient's response to treatment, examination of patient, obtaining history from patient or surrogate, ordering and performing treatments and interventions, ordering and review of laboratory studies, ordering and review of radiographic studies, pulse oximetry, re-evaluation of patient's condition and review of old charts   I assumed direction of critical care for this patient from another provider in my specialty: no     (including critical care time)  Medications Ordered in ED Medications  0.9 %  sodium chloride infusion (has no administration in time range)     Initial Impression / Assessment and Plan / ED Course  I have reviewed the triage vital signs and the nursing notes.  Pertinent labs & imaging results that were available during my care of the patient were reviewed by me and considered in my medical decision making (see chart for details).  Clinical Course as of Feb 23 1837  Mon Feb 23, 6645  2571 72 year old male with CHF status post bad placement here with generalized weakness.  Patient has  mild leukocytosis as well as acute on chronic anemia, likely secondary to known, chronic GI bleeds.  Patient is not actively bleeding at this time.  Will plan to transfuse, admit to cardiology.  Regarding his leukocytosis, he has a mild cough consistent with likely viral bronchitis.  No pneumonia on chest x-ray.  No evidence of sepsis. VAD numbers all green/WNL - normal flow, power, no evidence of drive site infection clinically.   [CI]    Clinical Course User Index [CI] Duffy Bruce, MD    Final Clinical Impressions(s) / ED Diagnoses   Final diagnoses:  LVAD (left ventricular assist device) present [facility])  Acute on chronic anemia    ED Discharge Orders    None       Duffy Bruce, MD 02/23/18 Bosie Helper

## 2018-02-23 NOTE — Progress Notes (Signed)
ANTICOAGULATION CONSULT NOTE - Initial Consult  Pharmacy Consult for warfarin Indication: LVAD  No Known Allergies  Patient Measurements:     Vital Signs:    Labs: Recent Labs    02/23/18 1659  HGB 7.7*  HCT 23.5*  PLT 202  LABPROT 30.3*  INR 2.93  CREATININE 2.33*    Estimated Creatinine Clearance: 28.2 mL/min (A) (by C-G formula based on SCr of 2.33 mg/dL (H)).   Medical History: Past Medical History:  Diagnosis Date  . AICD (automatic cardioverter/defibrillator) present   . Asthma   . AVM (arteriovenous malformation) of colon 07/07/2017  . CHF (congestive heart failure) ([place])   . Diabetes ([place])   . ESRF (end stage renal failure) ([place]) 07/07/2017   pt receiving hemodialysis Monday- Wednesday-friday, sometimes Saturday  . Kidney disease   . LVAD (left ventricular assist device) present ([place]) 12/2016  . Permanent atrial fibrillation ([place]) 07/07/2017  . Presence of permanent cardiac pacemaker    AICD  . Sleep apnea      Assessment: 71yom with HF Hx LVAD HM2 presented to ED with SOB, feels bad.  INR 2.9, h/h fell over last week 10/30> 7.7/23, LDH stable 257, WBC 13, CXr clear.  PTA warfarin dose 10mg  Tu/TH and 7.5mg  all other days   Goal of Therapy:  2-2.5 Monitor platelets by anticoagulation protocol: Yes   Plan:  Hold warfarin tonight  Daily INR  [name] Pharm.D. CPP, BCPS Clinical Pharmacist [phone] 02/23/2018 6:31 PM

## 2018-02-24 ENCOUNTER — Other Ambulatory Visit (HOSPITAL_COMMUNITY): Payer: Medicare Other

## 2018-02-24 ENCOUNTER — Encounter (HOSPITAL_COMMUNITY): Payer: Medicare Other

## 2018-02-24 DIAGNOSIS — Z95811 Presence of heart assist device: Secondary | ICD-10-CM

## 2018-02-24 DIAGNOSIS — N186 End stage renal disease: Secondary | ICD-10-CM

## 2018-02-24 LAB — CBC
HCT: 24.2 % — ABNORMAL LOW (ref 39.0–52.0)
HEMOGLOBIN: 8.1 g/dL — AB (ref 13.0–17.0)
MCH: 31.6 pg (ref 26.0–34.0)
MCHC: 33.5 g/dL (ref 30.0–36.0)
MCV: 94.5 fL (ref 78.0–100.0)
Platelets: 163 10*3/uL (ref 150–400)
RBC: 2.56 MIL/uL — ABNORMAL LOW (ref 4.22–5.81)
RDW: 17.8 % — AB (ref 11.5–15.5)
WBC: 10.3 10*3/uL (ref 4.0–10.5)

## 2018-02-24 LAB — PROTIME-INR
INR: 2.61
Prothrombin Time: 27.7 seconds — ABNORMAL HIGH (ref 11.4–15.2)

## 2018-02-24 LAB — GLUCOSE, CAPILLARY
Glucose-Capillary: 111 mg/dL — ABNORMAL HIGH (ref 65–99)
Glucose-Capillary: 179 mg/dL — ABNORMAL HIGH (ref 65–99)

## 2018-02-24 LAB — BASIC METABOLIC PANEL
Anion gap: 10 (ref 5–15)
BUN: 25 mg/dL — AB (ref 6–20)
CHLORIDE: 92 mmol/L — AB (ref 101–111)
CO2: 28 mmol/L (ref 22–32)
Calcium: 8.3 mg/dL — ABNORMAL LOW (ref 8.9–10.3)
Creatinine, Ser: 3.17 mg/dL — ABNORMAL HIGH (ref 0.61–1.24)
GFR calc Af Amer: 21 mL/min — ABNORMAL LOW (ref >60)
GFR calc non Af Amer: 18 mL/min — ABNORMAL LOW (ref >60)
GLUCOSE: 286 mg/dL — AB (ref 65–99)
Potassium: 3.7 mmol/L (ref 3.5–5.1)
Sodium: 130 mmol/L — ABNORMAL LOW (ref 135–145)

## 2018-02-24 LAB — MRSA PCR SCREENING: MRSA by PCR: NEGATIVE

## 2018-02-24 LAB — HEMOGLOBIN AND HEMATOCRIT, BLOOD
HCT: 26.5 % — ABNORMAL LOW (ref 39.0–52.0)
Hemoglobin: 8.9 g/dL — ABNORMAL LOW (ref 13.0–17.0)

## 2018-02-24 LAB — LACTATE DEHYDROGENASE: LDH: 232 U/L — ABNORMAL HIGH (ref 98–192)

## 2018-02-24 MED ORDER — ALBUTEROL SULFATE (2.5 MG/3ML) 0.083% IN NEBU
2.5000 mg | INHALATION_SOLUTION | RESPIRATORY_TRACT | Status: DC | PRN
Start: 2018-02-24 — End: 2018-03-05
  Administered 2018-02-28: 2.5 mg via RESPIRATORY_TRACT
  Filled 2018-02-24: qty 3

## 2018-02-24 MED ORDER — WARFARIN SODIUM 7.5 MG PO TABS
7.5000 mg | ORAL_TABLET | Freq: Once | ORAL | Status: AC
  Administered 2018-02-24: 7.5 mg via ORAL
  Filled 2018-02-24: qty 1

## 2018-02-24 MED ORDER — INSULIN ASPART 100 UNIT/ML ~~LOC~~ SOLN
0.0000 [IU] | Freq: Three times a day (TID) | SUBCUTANEOUS | Status: DC
  Administered 2018-02-24: 3 [IU] via SUBCUTANEOUS
  Administered 2018-02-25 (×2): 2 [IU] via SUBCUTANEOUS
  Administered 2018-02-26: 1 [IU] via SUBCUTANEOUS
  Administered 2018-02-26 – 2018-02-28 (×2): 2 [IU] via SUBCUTANEOUS
  Administered 2018-03-01 (×2): 1 [IU] via SUBCUTANEOUS
  Administered 2018-03-01 – 2018-03-02 (×2): 3 [IU] via SUBCUTANEOUS
  Administered 2018-03-03 – 2018-03-04 (×3): 2 [IU] via SUBCUTANEOUS
  Administered 2018-03-05: 1 [IU] via SUBCUTANEOUS

## 2018-02-24 MED ORDER — INSULIN ASPART 100 UNIT/ML ~~LOC~~ SOLN
0.0000 [IU] | Freq: Every day | SUBCUTANEOUS | Status: DC
  Administered 2018-02-25 – 2018-03-01 (×3): 2 [IU] via SUBCUTANEOUS

## 2018-02-24 MED ORDER — TRAMADOL HCL 50 MG PO TABS
50.0000 mg | ORAL_TABLET | Freq: Two times a day (BID) | ORAL | Status: DC | PRN
  Administered 2018-02-24 – 2018-02-27 (×5): 100 mg via ORAL
  Administered 2018-02-28: 50 mg via ORAL
  Administered 2018-03-01 – 2018-03-04 (×3): 100 mg via ORAL
  Filled 2018-02-24 (×5): qty 2
  Filled 2018-02-24: qty 1
  Filled 2018-02-24 (×4): qty 2

## 2018-02-24 MED ORDER — WARFARIN - PHARMACIST DOSING INPATIENT
Freq: Every day | Status: DC
  Administered 2018-02-24: 17:00:00

## 2018-02-24 NOTE — Progress Notes (Signed)
Inpatient Diabetes Program Recommendations  AACE/ADA: New Consensus Statement on Inpatient Glycemic Control (2015)  Target Ranges:  Prepandial:   less than 140 mg/dL      Peak postprandial:   less than 180 mg/dL (1-2 hours)      Critically ill patients:  140 - 180 mg/dL   Lab Results  Component Value Date   GLUCAP 112 (H) 12/03/2017   HGBA1C 7.5 09/03/2017    Review of Glycemic Control  Diabetes history: DM 2 Outpatient Diabetes medications: Lantus 25 units Current orders for Inpatient glycemic control: Lantus 25 units qhs  Inpatient Diabetes Program Recommendations:    Glucose trend in 200's. Consider Novolog Sensitive Correction 0-9 units tid + Novolog hs scale 0-5 units.  Thanks,  Tama Headings RN, MSN, BC-ADM, [facility] Inpatient Diabetes Coordinator Team Pager [phone] (8a-5p)

## 2018-02-24 NOTE — Progress Notes (Signed)
   Spoke with Dr. Florene Glen.  Will arrange for his scheduled HD tomorrow.   Due to the contractual nature of staff on HD floor, HD will be performed on 2C stepdown unit, so that VAD trained nurse will be readily available to assist  If needed.    Legrand Como [address] [place], PA-C 02/24/2018 12:40 PM

## 2018-02-24 NOTE — Progress Notes (Signed)
ANTICOAGULATION CONSULT NOTE  Pharmacy Consult for warfarin Indication: LVAD  No Known Allergies  Patient Measurements: Height: 5\' 5"  (165.1 cm) Weight: 180 lb (81.6 kg) IBW/kg (Calculated) : 61.5   Vital Signs: Temp: 97.3 F (36.3 C) (04/23 1315) Temp Source: Oral (04/23 1315) BP: 103/72 (04/23 1315) Pulse Rate: 69 (04/23 1102)  Labs: Recent Labs    02/23/18 1659 02/24/18 0303 02/24/18 0722  HGB 7.7* 8.1* 8.9*  HCT 23.5* 24.2* 26.5*  PLT 202 163  --   LABPROT 30.3* 27.7*  --   INR 2.93 2.61  --   CREATININE 2.33* 3.17*  --     Estimated Creatinine Clearance: 20.7 mL/min (A) (by C-G formula based on SCr of 3.17 mg/dL (H)).   Medical History: Past Medical History:  Diagnosis Date  . AICD (automatic cardioverter/defibrillator) present   . Asthma   . AVM (arteriovenous malformation) of colon 07/07/2017  . CHF (congestive heart failure) ([place])   . Diabetes ([place])   . ESRF (end stage renal failure) ([place]) 07/07/2017   pt receiving hemodialysis Monday- Wednesday-friday, sometimes Saturday  . Kidney disease   . LVAD (left ventricular assist device) present ([place]) 12/2016  . Permanent atrial fibrillation ([place]) 07/07/2017  . Presence of permanent cardiac pacemaker    AICD  . Sleep apnea      Assessment: 71yom with HF Hx LVAD HM2 presented to ED with SOB, feels bad.   INR on admit was supratherapeutic on 2.93 - dose was held last night. INR is still slightly elevated at 2.61. Received two units of PRBCs. Hgb 8.9, plts 163, LDH stable at 232. Has hx of ESRD.   PTA warfarin dose 10mg  Tu/TH and 7.5mg  all other days   Goal of Therapy:  INR 2-2.5 Monitor platelets by anticoagulation protocol: Yes   Plan:  Resume reduced dose of warfarin 7.5 mg tonight  Daily INR and monitor for signs/symptoms of bleeding  Doylene Canard, PharmD Clinical Pharmacist  Pager: [phone] Clinical Phone for 02/24/2018 until 3:30pm: [phone] If after 3:30pm, please call main pharmacy at  [phone] 02/24/2018 1:39 PM

## 2018-02-24 NOTE — Progress Notes (Signed)
LVAD Coordinator Rounding Note:  Admitted 02/23/18 per [name] due to symptomatic anemia.   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 Darcey Nora under DT criteria due to age excluding heart transplant.   Vital signs: Temp: 97.3 HR: 63 Doppler Pressure:  Not documented Automatic BP: 95/56 (67) O2 Sat: 98% on RA Wt: 180>180 lbs   LVAD interrogation reveals:  Speed: 9600 Flow: 5.0 Power:  5.9 PI: 5.4 Alarms: none Events:  12 PI events Fixed speed: 9600 Low speed limit: 9000  Drive Line:  Right abdominal sorbaview dressing dry and intact with 2 large tegaderms covering. Anchor intact and accurately applied. Patient presents to clinic today for drive line exit wound care. Existing VAD dressing removed and site care performed using sterile technique. Drive line exit site cleaned with Chlora prep applicators x 2, rinsed with sterile saline, allowed to dry, and Sorbaview dressing without bio patch re-applied. Exit site healed and incorporated, the velour is fully implanted at exit site. No redness, tenderness, drainage, foul odor or rash noted. Drive line anchor re-applied.   Labs:  LDH trend: 257>232  INR trend: 2.93> 2.61  Hgb trend: 7.7>8.1>8.9   Anticoagulation Plan: -INR Goal:  2.0 - 2.5 -ASA Dose: no ASA due to hx of GI bleed  Blood Products:  -02/23/18>2 units PCs   Device: - [employer]tronic single lead -Therapies: on at 231 bpm  Adverse Events on VAD: 02/2017> GIB- AVM clipped x2 07/2017> renal failure- HD started 11/2017>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 Margins not clear at neck.   Plan/Recommendations:   1. Weekly VAD drive line dressing changes using weekly kit and no bio patch. Rinse area with sterile saline after cleansing with Chlora prep. Bedside RN may change dressing. Next dressing change due 03/03/18. 2. Please call VAD pager if any VAD equipment or drive line issues.  [name] RN, VAD Coordinator 24/7 VAD pager:  [phone]

## 2018-02-24 NOTE — ED Notes (Signed)
Heart Healthy diet lunch tray ordered.  

## 2018-02-24 NOTE — Progress Notes (Addendum)
Advanced Heart Failure VAD Team Note  PCP-Cardiologist: No primary care provider on file.   Subjective:    Hgb 7.7 -> 8.9 with 2 PRBCs  Feeling better today, but not 100%. Tingling/pain in hands and feet has much improved. SOB is nearly gone.   Cough improving   LVAD INTERROGATION:  HeartMate 2 LVAD:   Flow 6.4 liters/min, speed 9600, power 7.0, PI 3.5.  12 PI events  Objective:    Vital Signs:   Temp:  [98.1 F (36.7 C)-98.8 F (37.1 C)] 98.3 F (36.8 C) (04/23 0903) Pulse Rate:  [48-114] 69 (04/23 1102) Resp:  [11-19] 16 (04/23 0903) BP: (85-140)/(56-91) 94/80 (04/23 1102) SpO2:  [91 %-100 %] 100 % (04/23 1102) Weight:  [180 lb (81.6 kg)] 180 lb (81.6 kg) (04/22 2027)   Mean arterial Pressure 80-90s  Intake/Output:   Intake/Output Summary (Last 24 hours) at 02/24/2018 1158 Last data filed at 02/24/2018 0530 Gross per 24 hour  Intake 1282 ml  Output -  Net 1282 ml     Physical Exam    General:  No resp difficulty, lying in bed HEENT: normal anicteric Neck: supple. JVP 8-9 cm. Carotids 2+ bilat; no bruits. No lymphadenopathy or thyromegaly appreciated. Cor: Mechanical heart sounds with LVAD hum present. Lungs: clear no wheeze  Abdomen: soft, nontender, nondistended. No hepatosplenomegaly. No bruits or masses. Good bowel sounds. Driveline: C/D/I; securement device intact and driveline incorporated No drainage  Extremities: no cyanosis, clubbing, or rash. 1+ BLE edema. Neuro: alert & orientedx3, cranial nerves grossly intact. moves all 4 extremities w/o difficulty. Affect pleasant   Telemetry   Afib 70s, personally reviewed.  EKG    No new tracings.    Labs   Basic Metabolic Panel: Recent Labs  Lab 02/23/18 1659 02/24/18 0303  NA 130* 130*  K 3.6 3.7  CL 91* 92*  CO2 27 28  GLUCOSE 295* 286*  BUN 15 25*  CREATININE 2.33* 3.17*  CALCIUM 8.4* 8.3*    Liver Function Tests: No results for input(s): AST, ALT, ALKPHOS, BILITOT, PROT, ALBUMIN in  the last 168 hours. No results for input(s): LIPASE, AMYLASE in the last 168 hours. No results for input(s): AMMONIA in the last 168 hours.  CBC: Recent Labs  Lab 02/23/18 1659 02/24/18 0303 02/24/18 0722  WBC 13.5* 10.3  --   HGB 7.7* 8.1* 8.9*  HCT 23.5* 24.2* 26.5*  MCV 96.3 94.5  --   PLT 202 163  --     INR: Recent Labs  Lab 02/23/18 1659 02/24/18 0303  INR 2.93 2.61    Other results:  EKG:    Imaging   Dg Chest 2 View  Result Date: 02/23/2018 CLINICAL DATA:  Shortness of breath. EXAM: CHEST - 2 VIEW COMPARISON:  11/24/2017. FINDINGS: Cardiomegaly, ventricular assist device, tricuspid and aortic valve replacements, along with AICD all in good position. No consolidation or edema. Minimal blunting LEFT CP angle. Previous median sternotomy for CABG. No osseous findings. IMPRESSION: Stable chest. Electronically Signed   By: Staci Righter M.D.   On: 02/23/2018 17:54      Medications:     Scheduled Medications: . atorvastatin  40 mg Oral q1800  . budesonide  0.25 mg Inhalation BID  . busPIRone  5 mg Oral BID  . [START ON 02/25/2018] calcitRIOL  0.5 mcg Oral Q M,W,F-HD  . citalopram  20 mg Oral Daily  . docusate sodium  100 mg Oral Daily  . doxycycline  100 mg Oral Q12H  .  ferric citrate  420 mg Oral TID WC  . gabapentin  300 mg Oral BID  . insulin aspart  0-5 Units Subcutaneous QHS  . insulin aspart  0-9 Units Subcutaneous TID WC  . insulin glargine  25 Units Subcutaneous QHS  . levothyroxine  25 mcg Oral QAC breakfast  . magnesium oxide  400 mg Oral Daily  . multivitamin  1 tablet Oral QHS  . pantoprazole  40 mg Oral Daily  . sildenafil  40 mg Oral TID     Infusions:   PRN Medications:  acetaminophen, albuterol, ondansetron (ZOFRAN) IV, traMADol, traZODone   Patient Profile   Trevor [name] is a 72 y.o. male with a history of CAD s/p CABG x 4 2010, OSA, AS with TAVR 2015, , ESRD on HD, DM2, chronic atrial fibrillation, asthma, and ischemic  cardiomyopathy with Medtronic ICD.  S/p HMII LVAD for Destination therapy on 12/14/2015.   Presented to [facility]23[facility]18 with worsening SOB and malaise s/p HD.   Assessment/Plan:    1. Symptomatic anemia - Hgb 7.7 -> 8.9 s/p 2 units.  - No overt signs of bleeding. Will follow for now.  May need Aranesp.  2. Acute bronchitis/URI - covering with course of doxy.  3. Acute on chronic systolic CHF: Ischemic cardiomyopathy, EF 20-25% with RV dysfunction on 2/18 echo pre-LVAD. 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 Medtronic ICD. He has had problems with RV failure post-op. He developed worsening renal dysfunction and finally had to start HD in 8/18. There has been some difficulty getting adequate fluid off him, currently dialyzing 4 days/week.  - He is much more SOB than usual, despite volume status looking stable after HD today.   - Continue warfarin with INR goal 2-2.5, no ASA.INR supratherapeutic yesterday. Trending down. Will resume tonight to prevent dropping out of range.  Discussed dosing with Pharm-D personally.  - Multiple nephrologists have confirmed he is poor candidate for HD 3 times a week due to CHF and rapid fluid re-accumulation. Will need to continue 4 times a week.  4. CAD: s/p CABG.  - No s/s of ischemia.    - He is on atorvastatin.  5. ESRD: Tolerating HD, now able to use fistula.  - Will consult renal for HD tomorrow in room.  6. Atrial fibrillation: Chronic.  - Rate controlled. On coumadin.  - INR trending down. Goal as above.  7. RV failure:  - Continue revatio 40 mg TID.  - No change to current plan.   8. HTN:  - MAP controlled currently. Lower on HD days.  9. 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  - No overt bleeding despite drop in Hgb. FOBT pending. - Continue octreotide injections.  10. 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  - Holding off on  radiation. He does not think he can handle radiation as well as HD 4 days/week.  - No change to current plan.   11. H/o TAVR:  - Stable on recent echo.   I reviewed the LVAD parameters from today, and compared the results to the patient's prior recorded data.  No programming changes were made.  The LVAD is functioning within specified parameters.  The patient performs LVAD self-test daily.  LVAD interrogation was negative for any significant power changes, alarms or PI events/speed drops.  LVAD equipment check completed and is in good working order.  Back-up equipment present.   LVAD  education done on emergency procedures and precautions and reviewed exit site care.  Length of Stay: 1  Annamaria Helling 02/24/2018, 11:58 AM  VAD Team --- VAD ISSUES ONLY--- Pager [phone] (7am - 7am)  Advanced Heart Failure Team  Pager [phone] (M-F; 7a - 4p)  Please contact [facility] for night-coverage after hours (4p -7a ) and weekends on [email or web address]  Patient seen and examined with the above-signed Advanced Practice Provider and/or Housestaff. I personally reviewed laboratory data, imaging studies and relevant notes. I independently examined the patient and formulated the important aspects of the plan. I have edited the note to reflect any of my changes or salient points. I have personally discussed the plan with the patient and/or family.  He is much improved with transfusion and abx. Will aim to keep hgb up. No evidence of obvious bleeding. FOBT pending. VAD interrogated personally. Parameters stable. Continue abx for bronchitis. Will have HD tomorrow. Will see how he feels after HD.  INR 2.61 Discussed dosing with PharmD personally.  Glori Bickers, MD  4:27 PM

## 2018-02-25 LAB — TYPE AND SCREEN
ABO/RH(D): A NEG
ANTIBODY SCREEN: NEGATIVE
UNIT DIVISION: 0
Unit division: 0

## 2018-02-25 LAB — BPAM RBC
BLOOD PRODUCT EXPIRATION DATE: 201905172359
Blood Product Expiration Date: 201905172359
ISSUE DATE / TIME: 201904230312
ISSUE DATE / TIME: 201904221943
UNIT TYPE AND RH: 600
Unit Type and Rh: 600

## 2018-02-25 LAB — CBC
HEMATOCRIT: 27.3 % — AB (ref 39.0–52.0)
Hemoglobin: 9.1 g/dL — ABNORMAL LOW (ref 13.0–17.0)
MCH: 30.8 pg (ref 26.0–34.0)
MCHC: 33.3 g/dL (ref 30.0–36.0)
MCV: 92.5 fL (ref 78.0–100.0)
Platelets: 146 10*3/uL — ABNORMAL LOW (ref 150–400)
RBC: 2.95 MIL/uL — AB (ref 4.22–5.81)
RDW: 19.9 % — AB (ref 11.5–15.5)
WBC: 9.3 10*3/uL (ref 4.0–10.5)

## 2018-02-25 LAB — BASIC METABOLIC PANEL
ANION GAP: 12 (ref 5–15)
BUN: 38 mg/dL — ABNORMAL HIGH (ref 6–20)
CALCIUM: 8.6 mg/dL — AB (ref 8.9–10.3)
CO2: 27 mmol/L (ref 22–32)
Chloride: 94 mmol/L — ABNORMAL LOW (ref 101–111)
Creatinine, Ser: 4.16 mg/dL — ABNORMAL HIGH (ref 0.61–1.24)
GFR calc non Af Amer: 13 mL/min — ABNORMAL LOW (ref >60)
GFR, EST AFRICAN AMERICAN: 15 mL/min — AB (ref >60)
Glucose, Bld: 69 mg/dL (ref 65–99)
POTASSIUM: 3.5 mmol/L (ref 3.5–5.1)
Sodium: 133 mmol/L — ABNORMAL LOW (ref 135–145)

## 2018-02-25 LAB — GLUCOSE, CAPILLARY
GLUCOSE-CAPILLARY: 222 mg/dL — AB (ref 65–99)
Glucose-Capillary: 216 mg/dL — ABNORMAL HIGH (ref 65–99)
Glucose-Capillary: 65 mg/dL (ref 65–99)
Glucose-Capillary: 174 mg/dL — ABNORMAL HIGH (ref 65–99)
Glucose-Capillary: 150 mg/dL — ABNORMAL HIGH (ref 65–99)

## 2018-02-25 LAB — PROTIME-INR
INR: 2.17
Prothrombin Time: 24 seconds — ABNORMAL HIGH (ref 11.4–15.2)

## 2018-02-25 LAB — LACTATE DEHYDROGENASE: LDH: 224 U/L — ABNORMAL HIGH (ref 98–192)

## 2018-02-25 MED ORDER — WARFARIN SODIUM 10 MG PO TABS
10.0000 mg | ORAL_TABLET | Freq: Once | ORAL | Status: AC
  Administered 2018-02-25: 10 mg via ORAL
  Filled 2018-02-25: qty 1

## 2018-02-25 MED ORDER — SEVELAMER CARBONATE 800 MG PO TABS
3200.0000 mg | ORAL_TABLET | Freq: Three times a day (TID) | ORAL | Status: DC
  Administered 2018-02-25 – 2018-03-05 (×21): 3200 mg via ORAL
  Filled 2018-02-25 (×20): qty 4

## 2018-02-25 MED ORDER — INSULIN GLARGINE 100 UNIT/ML ~~LOC~~ SOLN
15.0000 [IU] | Freq: Every day | SUBCUTANEOUS | Status: DC
  Administered 2018-02-25 – 2018-03-04 (×8): 15 [IU] via SUBCUTANEOUS
  Filled 2018-02-25 (×9): qty 0.15

## 2018-02-25 MED ORDER — POLYETHYLENE GLYCOL 3350 17 G PO PACK
17.0000 g | PACK | Freq: Once | ORAL | Status: DC
  Filled 2018-02-25: qty 1

## 2018-02-25 MED ORDER — SODIUM CHLORIDE 0.9 % IV SOLN
125.0000 mg | INTRAVENOUS | Status: AC
  Administered 2018-02-25: 125 mg via INTRAVENOUS
  Filled 2018-02-25: qty 10

## 2018-02-25 NOTE — Consult Note (Signed)
[facility] Renal Consultation Note    Indication for Consultation:  Management of ESRD/hemodialysis; anemia, hypertension/volume and secondary hyperparathyroidism PCP:  HPI: Trevor [name] is a 72 y.o. male with [facility] ESR[facility] 2/2 cardiorenal syndrome. PMH also significant for CAD s/p CABGx4 (2010), OSA, AS s/p TAVR (2015), DM2, paroxysmal A. Fib, asthma, and ischemic cardiomyopathy (EF 20-25%) with Medtronic ICD s/p Heartmate II LVAD 12/2016, h/o GIB d/t AVMs s/p APC + clipping (02/2017), and h/o thrombocytopenia, SCCa L neck, RLE s/p excision 11/2017  He is admitted with symptomatic anemia. Hgb 7.7 on admission, up to 9.1 s/p transfusion 2U PRBCs. Hgb had been trending down as outpatient 10.0 on 4/3, 9.6 on 4/10, 8.3 on 4/17.  He presented to the ED Monday after dialysis with SOB and numbness/tingling in hands and feet. CXR was clear.  Labs Na 133, K 3.5, Cr 4.16, BUN 38, WBC 9.3, Hgb 9.1  Today he says he feels much better than when he arrived. Dyspnea has resolved. Denies HA, CP, Abd pain, N,V,D. Not aware of any overt bleeding.   He has been compliant with dialysis and is attending 4x/week sessions. Usually reaches dry weight by Saturday, but feels pretty bad after Saturday treatments.  Struggles to reach EDW during the week.  On Monday, he left 1.9kg over his dry weight. He felt "wiped out" after and describes bad cramping in feet.    Past Medical History:  Diagnosis Date  . AICD (automatic cardioverter/defibrillator) present   . Asthma   . AVM (arteriovenous malformation) of colon 07/07/2017  . CHF (congestive heart failure) ([place])   . Diabetes ([place])   . ESRF (end stage renal failure) ([place]) 07/07/2017   pt receiving hemodialysis Monday- Wednesday-friday, sometimes Saturday  . Kidney disease   . LVAD (left ventricular assist device) present ([place]) 12/2016  . Permanent atrial fibrillation ([place]) 07/07/2017  . Presence of  permanent cardiac pacemaker    AICD  . Sleep apnea    Past Surgical History:  Procedure Laterality Date  . AORTIC VALVE REPLACEMENT  2015   [place]  . APPLICATION OF A-CELL OF EXTREMITY Left 11/20/2017   Procedure: APPLICATION OF A-CELL;  Surgeon: Wallace Going, DO;  Location: [place];  Service: Plastics;  Laterality: Left;  . AV FISTULA PLACEMENT Left 06/30/2017   Procedure: CREATION of LEFT ARM Brachiocephalic Fistula;  Surgeon: Conrad Altura, MD;  Location: [place];  Service: Vascular;  Laterality: Left;  . CARDIAC CATHETERIZATION N/A 12/02/2016   Procedure: Right Heart Cath;  Surgeon: Larey Dresser, MD;  Location: [place] CV LAB;  Service: Cardiovascular;  Laterality: N/A;  . COLONOSCOPY N/A 02/14/2017   Procedure: COLONOSCOPY;  Surgeon: Milus Banister, MD;  Location: [place];  Service: Endoscopy;  Laterality: N/A;  . CORONARY ANGIOPLASTY WITH STENT PLACEMENT  2013   in [place]  . CORONARY ARTERY BYPASS GRAFT  2010   in [place]  . DIALYSIS/PERMA CATHETER INSERTION  06/30/2017   Procedure: INSERTION Dialysis Catheter;  Surgeon: Conrad [place], MD;  Location: [place];  Service: Vascular;;  . IABP INSERTION N/A 12/11/2016   Procedure: IABP Insertion;  Surgeon: Larey Dresser, MD;  Location: [place] CV LAB;  Service: Cardiovascular;  Laterality: N/A;  . INSERTION OF IMPLANTABLE LEFT VENTRICULAR ASSIST DEVICE N/A 12/13/2016   Procedure: INSERTION OF IMPLANTABLE LEFT VENTRICULAR ASSIST DEVICE;  Surgeon: Ivin Poot, MD;  Location: [place];  Service: Open Heart Surgery;  Laterality: N/A;  HEARTMATE II  NITRIC OXIDE  . IR FLUORO GUIDE CV LINE LEFT  06/17/2017  . IR US GUIDE VASC ACCESS LEFT  06/17/2017  . MASS EXCISION Left 11/20/2017   Procedure: EXCISION OF LEFT NECK AND RIGHT LEG SKIN CANCER WITH A CELL PLACEMENT;  Surgeon: Wallace Going, DO;  Location: [place];  Service: Plastics;  Laterality: Left;  Marland Kitchen MASS EXCISION Left 11/26/2017   Procedure: EXPLORATION AND EXCISION OF  LEFT NECK;  Surgeon: Wallace Going, DO;  Location: [place];  Service: Plastics;  Laterality: Left;  . RIGHT HEART CATH N/A 12/11/2016   Procedure: Right Heart Cath;  Surgeon: Larey Dresser, MD;  Location: [place] CV LAB;  Service: Cardiovascular;  Laterality: N/A;  . RIGHT HEART CATH N/A 03/27/2017   Procedure: Right Heart Cath;  Surgeon: Larey Dresser, MD;  Location: [place] CV LAB;  Service: Cardiovascular;  Laterality: N/A;  . RIGHT HEART CATH N/A 06/13/2017   Procedure: RIGHT HEART CATH;  Surgeon: Jolaine Artist, MD;  Location: [place] [facility];  Service: Cardiovascular;  Laterality: N/A;  . TEE WITHOUT CARDIOVERSION N/A 12/13/2016   Procedure: TRANSESOPHAGEAL ECHOCARDIOGRAM (TEE);  Surgeon: Ivin Poot, MD;  Location: [place];  Service: Open Heart Surgery;  Laterality: N/A;  . TRICUSPID VALVE REPLACEMENT N/A 12/13/2016   Procedure: TRICUSPID VALVE REPAIR WITH EDWARDS MC 3 TRICUSPID ANNULOPLASTY RING MODEL 4900 SIZE T 28;  Surgeon: Ivin Poot, MD;  Location: [place];  Service: Open Heart Surgery;  Laterality: N/A;   Family History  Problem Relation Age of Onset  . Heart failure Father   . Heart attack Father   . Healthy Mother   . Diabetes Paternal Grandfather    Social History:  reports that he has never smoked. He has never used smokeless tobacco. He reports that he does not drink alcohol or use drugs. No Known Allergies Prior to Admission medications   Medication Sig Start Date End Date Taking? Authorizing Provider  albuterol (PROVENTIL HFA;VENTOLIN HFA) 108 (90 Base) MCG/ACT inhaler Inhale 2 puffs into the lungs every 4 (four) hours as needed for wheezing or shortness of breath. 02/10/18  Yes Larey Dresser, MD  albuterol (PROVENTIL) (2.5 MG/3ML) 0.083% nebulizer solution Take 3 mLs (2.5 mg total) by nebulization every 4 (four) hours. And as needed Patient taking differently: Take 2.5 mg by nebulization every 4 (four) hours as needed for wheezing or shortness  of breath.  11/27/16  Yes Byrum, Rose Fillers, MD  atorvastatin (LIPITOR) 40 MG tablet TAKE 1 TABLET(40 MG) BY MOUTH DAILY Patient taking differently: Take 40 mg by mouth daily at 6 PM.  09/02/17  Yes Larey Dresser, MD  busPIRone (BUSPAR) 5 MG tablet Take 1 tablet (5 mg total) by mouth 2 (two) times daily. 01/22/18  Yes Bensimhon, Shaune Pascal, MD  calcitRIOL (ROCALTROL) 0.5 MCG capsule Take 1 capsule (0.5 mcg total) by mouth every other day. Patient taking differently: Take 0.5 mcg by mouth every Monday, Wednesday, and Friday with hemodialysis.  07/08/17  Yes Bensimhon, Shaune Pascal, MD  docusate sodium (COLACE) 100 MG capsule Take 1 capsule (100 mg total) by mouth 2 (two) times daily. Patient taking differently: Take 100 mg daily by mouth.  04/03/17  Yes Larey Dresser, MD  ferric citrate (AURYXIA) 1 GM 210 MG(Fe) tablet Take 420 mg by mouth 3 (three) times daily with meals.   Yes [provider]  fluticasone (FLOVENT HFA) 110 MCG/ACT inhaler Inhale 2 puffs into the  lungs 2 (two) times daily. 02/10/18  Yes Larey Dresser, MD  gabapentin (NEURONTIN) 600 MG tablet Take 0.5 tablets (300 mg total) by mouth 2 (two) times daily. 07/23/17  Yes Larey Dresser, MD  insulin glargine (LANTUS) 100 unit/mL SOPN Inject 0.25 mLs (25 Units total) into the skin at bedtime. 12/18/17  Yes Clegg, Amy D, NP  levothyroxine (SYNTHROID, LEVOTHROID) 25 MCG tablet Take 1 tablet (25 mcg total) by mouth daily before breakfast. 10/02/17  Yes Larey Dresser, MD  magnesium oxide (MAG-OX) 400 MG tablet Take 400 mg by mouth daily.   Yes [provider]  multivitamin (RENA-VIT) TABS tablet Take 1 tablet by mouth at bedtime. 09/02/17  Yes Larey Dresser, MD  pantoprazole (PROTONIX) 40 MG tablet Take 1 tablet (40 mg total) by mouth daily. 08/11/17  Yes Clegg, Amy D, NP  sevelamer carbonate (RENVELA) 800 MG tablet Take 1,600-2,400 mg by mouth See admin instructions. Take 2,400 mg by mouth three times a day with meals and  1,600 mg two times a day with snacks 02/03/18  Yes [provider]  sildenafil (REVATIO) 20 MG tablet Take 2 tablets (40 mg total) 3 (three) times daily by mouth. Patient taking differently: Take 40 mg by mouth See admin instructions. Take 40 mg by mouth three times a day- midday, evening, and bedtime 09/11/17  Yes Larey Dresser, MD  traMADol (ULTRAM) 50 MG tablet Take 1 tablet (50 mg total) by mouth every 6 (six) hours as needed. TAKE 1 TABLET BY MOUTH EVERY 6 HOURS AS NEEDED FOR MODERATE PAIN Patient taking differently: Take 50 mg by mouth every 6 (six) hours as needed for moderate pain.  01/01/18  Yes Larey Dresser, MD  traZODone (DESYREL) 100 MG tablet TAKE 1 TABLET BY MOUTH EVERY NIGHT AT BEDTIME AS NEEDED FOR SLEEP Patient taking differently: Take 100 mg by mouth at bedtime 02/16/18  Yes Larey Dresser, MD  warfarin (COUMADIN) 5 MG tablet Take 7.5-10 mg by mouth See admin instructions. Take 7.5 mg by mouth in the evening with dinner on Sun/Mon/Wed/Fri/Sat and 10 mg on Tues/Thurs   Yes [provider]   Current Facility-Administered Medications  Medication Dose Route Frequency Provider Last Rate Last Dose  . acetaminophen (TYLENOL) tablet 650 mg  650 mg Oral Q4H PRN Shirley Friar, PA-C   650 mg at 02/25/18 0901  . albuterol (PROVENTIL) (2.5 MG/3ML) 0.083% nebulizer solution 2.5 mg  2.5 mg Nebulization Q4H PRN Shirley Friar, PA-C      . atorvastatin (LIPITOR) tablet 40 mg  40 mg Oral q1800 Shirley Friar, PA-C   40 mg at 02/24/18 1705  . budesonide (PULMICORT) nebulizer solution 0.25 mg  0.25 mg Inhalation BID Shirley Friar, PA-C   0.25 mg at 02/25/18 6073  . busPIRone (BUSPAR) tablet 5 mg  5 mg Oral BID Shirley Friar, PA-C   5 mg at 02/25/18 0901  . calcitRIOL (ROCALTROL) capsule 0.5 mcg  0.5 mcg Oral Q M,W,F-HD Shirley Friar, PA-C      . citalopram (CELEXA) tablet 20 mg  20 mg Oral Daily Shirley Friar,  PA-C   20 mg at 02/25/18 0855  . docusate sodium (COLACE) capsule 100 mg  100 mg Oral Daily Shirley Friar, PA-C   100 mg at 02/25/18 0857  . doxycycline (VIBRA-TABS) tablet 100 mg  100 mg Oral Q12H Bensimhon, Shaune Pascal, MD   100 mg at 02/25/18 0856  . ferric citrate (AURYXIA)  tablet 420 mg  420 mg Oral TID WC Shirley Friar, PA-C   420 mg at 02/25/18 0800  . gabapentin (NEURONTIN) capsule 300 mg  300 mg Oral BID Shirley Friar, PA-C   300 mg at 02/25/18 0901  . insulin aspart (novoLOG) injection 0-5 Units  0-5 Units Subcutaneous QHS Bensimhon, Daniel R, MD      . insulin aspart (novoLOG) injection 0-9 Units  0-9 Units Subcutaneous TID WC Bensimhon, Shaune Pascal, MD   3 Units at 02/24/18 1403  . insulin glargine (LANTUS) injection 15 Units  15 Units Subcutaneous QHS Shirley Friar, PA-C      . levothyroxine (SYNTHROID, LEVOTHROID) tablet 25 mcg  25 mcg Oral QAC breakfast Shirley Friar, PA-C   25 mcg at 02/25/18 0855  . magnesium oxide (MAG-OX) tablet 400 mg  400 mg Oral Daily Shirley Friar, PA-C   400 mg at 02/25/18 0855  . multivitamin (RENA-VIT) tablet 1 tablet  1 tablet Oral QHS Shirley Friar, PA-C   1 tablet at 02/24/18 2156  . ondansetron (ZOFRAN) injection 4 mg  4 mg Intravenous Q6H PRN Shirley Friar, PA-C      . pantoprazole (PROTONIX) EC tablet 40 mg  40 mg Oral Daily Shirley Friar, PA-C   40 mg at 02/25/18 0856  . polyethylene glycol (MIRALAX / GLYCOLAX) packet 17 g  17 g Oral Once Shirley Friar, PA-C      . sildenafil (REVATIO) tablet 40 mg  40 mg Oral TID Shirley Friar, PA-C   40 mg at 02/25/18 0856  . traMADol (ULTRAM) tablet 50-100 mg  50-100 mg Oral Q12H PRN Shirley Friar, PA-C   100 mg at 02/24/18 2156  . traZODone (DESYREL) tablet 100 mg  100 mg Oral QHS PRN Shirley Friar, PA-C   100 mg at 02/24/18 2156  . Warfarin - Pharmacist Dosing Inpatient   Does not apply  q1800 Bensimhon, Shaune Pascal, MD         ROS: As per HPI otherwise negative.  Physical Exam: Vitals:   02/25/18 0450 02/25/18 0500 02/25/18 0816 02/25/18 0824  BP:    98/78  Pulse: 75     Resp: 11   (!) 21  Temp: (!) 97 F (36.1 C)   98 F (36.7 C)  TempSrc: Oral     SpO2: 97%  100%   Weight:  84.1 kg (185 lb 4.8 oz)    Height:         General: WDWN male sitting recliner NAD  Head: NCAT sclera not icteric MMM Neck: Supple. No JVD No masses Lungs: CTA bilaterally without wheezes, rales, or rhonchi. Breathing is unlabored. Heart: mechanical heart sounds LVAD  Abdomen: soft NT + BS Lower extremities: trace LE edema healing R leg wound  Neuro: A & O  X 3. Moves all extremities spontaneously. Psych:  Responds to questions appropriately with a normal affect. Dialysis Access: LUE AVF+bruit   Labs: Basic Metabolic Panel: Recent Labs  Lab 02/23/18 1659 02/24/18 0303 02/25/18 0607  NA 130* 130* 133*  K 3.6 3.7 3.5  CL 91* 92* 94*  CO2 27 28 27   GLUCOSE 295* 286* 69  BUN 15 25* 38*  CREATININE 2.33* 3.17* 4.16*  CALCIUM 8.4* 8.3* 8.6*   Liver Function Tests: No results for input(s): AST, ALT, ALKPHOS, BILITOT, PROT, ALBUMIN in the last 168 hours. No results for input(s): LIPASE, AMYLASE in the last 168 hours. No results for input(s): AMMONIA  in the last 168 hours. CBC: Recent Labs  Lab 02/23/18 1659 02/24/18 0303 02/24/18 0722 02/25/18 0607  WBC 13.5* 10.3  --  9.3  HGB 7.7* 8.1* 8.9* 9.1*  HCT 23.5* 24.2* 26.5* 27.3*  MCV 96.3 94.5  --  92.5  PLT 202 163  --  146*   Cardiac Enzymes: No results for input(s): CKTOTAL, CKMB, CKMBINDEX, TROPONINI in the last 168 hours. CBG: Recent Labs  Lab 02/24/18 1356 02/24/18 1621 02/24/18 2135 02/25/18 0827  GLUCAP 216* 111* 179* 65   Iron Studies: No results for input(s): IRON, TIBC, TRANSFERRIN, FERRITIN in the last 72 hours. Studies/Results: Dg Chest 2 View  Result Date: 02/23/2018 CLINICAL DATA:  Shortness of  breath. EXAM: CHEST - 2 VIEW COMPARISON:  11/24/2017. FINDINGS: Cardiomegaly, ventricular assist device, tricuspid and aortic valve replacements, along with AICD all in good position. No consolidation or edema. Minimal blunting LEFT CP angle. Previous median sternotomy for CABG. No osseous findings. IMPRESSION: Stable chest. Electronically Signed   By: Staci Righter M.D.   On: 02/23/2018 17:54    Dialysis Orders:  [facility] MWFSat 4.5h 180NRe 400/800 EDW 83kg 2K/2Ca  L AVF Hep 5800 Venofer 100mg  IV x 5 (4/5 dosed) Mircera 12mcg IV q 2 weeks (last 4/17)  Calcitriol 0.56mcg PO TIW  Renvela 4 tabs tid qac, 2 bid q snacks  Assessment/Plan: 1. Symptomatic anemia. Hgb 7.7 on admission -- 9.1 s/p 2U PRBCs on 4/22. H/o GI Bleed - FOBT ordered. ESA dosed last week as outpatient. Continue IV Fe load here  2. ESRD -  MWFSat. Orders written for routine HD today. No heparin, added K bath.  3. HTN/Volume  - BP stable No volume excess on exam. CXR clear - UF goal 2L. May need EDW adjustment. Follow weights   4. Metabolic bone disease -  Continue Calcitriol/Renvela binder  5. ICM/CHF EF 20-25% s/p LVAD/ICD - per cards. 6. CAD s/p CABG -  7. Afib - on Coumadin, INR at goal  - per cards  8. SCCa s/p excision L neck/R LE - declined radation 9.  Nutrition - Renal diet/vitamins   Lynnda Child PA-C [facility] Pager [phone] 02/25/2018, 11:02 AM

## 2018-02-25 NOTE — Progress Notes (Addendum)
Advanced Heart Failure VAD Team Note  PCP-Cardiologist: No primary care provider on file.   Subjective:    Hgb 7.7 -> 8.9 -> 9.1 s/p 2 UPRBCs on arrival.   Continues to feel better. Feels closer to his normal baseline. Denies SOB currently. No BM since admit. Cough has improved on ABX. No orthopnea, PND. No melena  LVAD INTERROGATION:  HeartMate 2 LVAD:   Flow 5.8 liters/min, speed 9600 power 6.0, PI 3.1. Occasional PI events.   Objective:    Vital Signs:   Temp:  [97 F (36.1 C)-98.2 F (36.8 C)] 98 F (36.7 C) (04/24 0824) Pulse Rate:  [67-87] 75 (04/24 0450) Resp:  [11-21] 21 (04/24 0824) BP: (98-121)/(72-78) 98/78 (04/24 0824) SpO2:  [97 %-100 %] 100 % (04/24 0816) Weight:  [180 lb (81.6 kg)-185 lb 4.8 oz (84.1 kg)] 185 lb 4.8 oz (84.1 kg) (04/24 0500) Last BM Date: 02/22/18 Mean arterial Pressure 80-90s  Intake/Output:   Intake/Output Summary (Last 24 hours) at 02/25/2018 1107 Last data filed at 02/25/2018 1100 Gross per 24 hour  Intake 560 ml  Output 550 ml  Net 10 ml     Physical Exam    General:  NAD.  HEENT: normal  Neck: supple. JVP 7-8.  Carotids 2+ bilat; no bruits. No lymphadenopathy or thryomegaly appreciated. Cor: LVAD hum.  Lungs: Clear. Abdomen: obese soft, nontender, non-distended. No hepatosplenomegaly. No bruits or masses. Good bowel sounds. Driveline site clean. Anchor in place.  Extremities: no cyanosis, clubbing, rash. Warm 1+ edema  Neuro: alert & oriented x 3. No focal deficits. Moves all 4 without problem    Telemetry   Afib 60-70s, personally reviewed.   EKG    No new tracings.    Labs   Basic Metabolic Panel: Recent Labs  Lab 02/23/18 1659 02/24/18 0303 02/25/18 0607  NA 130* 130* 133*  K 3.6 3.7 3.5  CL 91* 92* 94*  CO2 27 28 27   GLUCOSE 295* 286* 69  BUN 15 25* 38*  CREATININE 2.33* 3.17* 4.16*  CALCIUM 8.4* 8.3* 8.6*    Liver Function Tests: No results for input(s): AST, ALT, ALKPHOS, BILITOT, PROT, ALBUMIN  in the last 168 hours. No results for input(s): LIPASE, AMYLASE in the last 168 hours. No results for input(s): AMMONIA in the last 168 hours.  CBC: Recent Labs  Lab 02/23/18 1659 02/24/18 0303 02/24/18 0722 02/25/18 0607  WBC 13.5* 10.3  --  9.3  HGB 7.7* 8.1* 8.9* 9.1*  HCT 23.5* 24.2* 26.5* 27.3*  MCV 96.3 94.5  --  92.5  PLT 202 163  --  146*    INR: Recent Labs  Lab 02/23/18 1659 02/24/18 0303 02/25/18 0607  INR 2.93 2.61 2.17    Other results:  EKG:    Imaging   Dg Chest 2 View  Result Date: 02/23/2018 CLINICAL DATA:  Shortness of breath. EXAM: CHEST - 2 VIEW COMPARISON:  11/24/2017. FINDINGS: Cardiomegaly, ventricular assist device, tricuspid and aortic valve replacements, along with AICD all in good position. No consolidation or edema. Minimal blunting LEFT CP angle. Previous median sternotomy for CABG. No osseous findings. IMPRESSION: Stable chest. Electronically Signed   By: Staci Righter M.D.   On: 02/23/2018 17:54     Medications:     Scheduled Medications: . atorvastatin  40 mg Oral q1800  . budesonide  0.25 mg Inhalation BID  . busPIRone  5 mg Oral BID  . calcitRIOL  0.5 mcg Oral Q M,W,F-HD  . citalopram  20 mg  Oral Daily  . docusate sodium  100 mg Oral Daily  . doxycycline  100 mg Oral Q12H  . ferric citrate  420 mg Oral TID WC  . gabapentin  300 mg Oral BID  . insulin aspart  0-5 Units Subcutaneous QHS  . insulin aspart  0-9 Units Subcutaneous TID WC  . insulin glargine  15 Units Subcutaneous QHS  . levothyroxine  25 mcg Oral QAC breakfast  . magnesium oxide  400 mg Oral Daily  . multivitamin  1 tablet Oral QHS  . pantoprazole  40 mg Oral Daily  . polyethylene glycol  17 g Oral Once  . sildenafil  40 mg Oral TID  . Warfarin - Pharmacist Dosing Inpatient   Does not apply q1800    Infusions:   PRN Medications: acetaminophen, albuterol, ondansetron (ZOFRAN) IV, traMADol, traZODone   Patient Profile   Trevor [name] is a 72 y.o.  male with a history of CAD s/p CABG x 4 2010, OSA, AS with TAVR 2015, , ESRD on HD, DM2, chronic atrial fibrillation, asthma, and ischemic cardiomyopathy with Medtronic ICD.  S/p HMII LVAD for Destination therapy on 12/14/2015.   Presented to [facility] 02/23/18 with worsening SOB and malaise s/p HD.   Assessment/Plan:    1. Symptomatic anemia - Hgb 7.7 -> 8.9 -> 9.1 s/p 2 units.  - No overt signs of bleeding. Will follow for now.  May need Aranesp.  2. Acute bronchitis/URI - Covering with course of doxy with improvement. 3. Acute on chronic systolic CHF: Ischemic cardiomyopathy, EF 20-25% with RV dysfunction on 2/18 echo pre-LVAD. 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 Medtronic ICD. He has had problems with RV failure post-op. He developed worsening renal dysfunction and finally had to start HD in 8/18. There has been some difficulty getting adequate fluid off him, currently dialyzing 4 days/week.  - He is much more SOB than usual, despite volume status looking stable after HD today.   - Continue warfarin with INR goal 2-2.5, no ASA.INR in range. Discussed dosing with Pharm-D personally.  - Will discuss with Renal optimal volume status and timing to improve QOL. Would try and get back to 3 times a week HD if possible.  4. CAD: s/p CABG.  - No s/s of ischemia.    - He is on atorvastatin.  5. ESRD: Tolerating HD, now able to use fistula.  - For HD sometime today. Will assess how he feels after prior to D/C (this evening vs tomorrow am) 6. Atrial fibrillation: Chronic.  - Rate controlled. On coumadin.  - INR 2.17. In goal. Discussed dosing with Pharm-D personally.  7. RV failure:  - Continue revatio 40 mg TID.  - No change to current plan.   8. HTN:  - MAP stable. Lower on HD days.  9. 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  - No overt bleeding. No BM since admit. FOBT as able.  - Continue octreotide injections.  10. 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  - Holding off on radiation. He does not think he can handle radiation as well as HD 4 days/week.  - No change to current plan.   11. H/o TAVR:  - Stable on recent Echo.   I reviewed the LVAD parameters from today, and compared the results to the patient's prior recorded data.  No programming changes were made.  The LVAD is functioning within specified  parameters.  The patient performs LVAD self-test daily.  LVAD interrogation was negative for any significant power changes, alarms or PI events/speed drops.  LVAD equipment check completed and is in good working order.  Back-up equipment present.   LVAD education done on emergency procedures and precautions and reviewed exit site care.  Length of Stay: 2  Annamaria Helling 02/25/2018, 11:07 AM  VAD Team --- VAD ISSUES ONLY--- Pager [phone] (7am - 7am)  Advanced Heart Failure Team  Pager [phone] (M-F; 7a - 4p)  Please contact [facility] for night-coverage after hours (4p -7a ) and weekends on [email or web address]  Patient seen and examined with the above-signed Advanced Practice Provider and/or Housestaff. I personally reviewed laboratory data, imaging studies and relevant notes. I independently examined the patient and formulated the important aspects of the plan. I have edited the note to reflect any of my changes or salient points. I have personally discussed the plan with the patient and/or family.  Overall improved with transfusion. Volume status mildly elevated. For HD today. Bronchitis improving on doxy. INR 2.17. Discussed dosing with PharmD personally. VAD interrogated personally. Parameters stable.  Will see how he tolerates HD. Hopefully home in am   Glori Bickers, MD  8:19 PM

## 2018-02-25 NOTE — Progress Notes (Signed)
LVAD Coordinator Rounding Note:  Admitted 02/23/18 per [name] due to symptomatic anemia.   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 Darcey Nora under DT criteria due to age excluding heart transplant.   Pt says he is feeling much better today and slept well last night. HD planned for today. No BM since admission, reports last BM was several days ago and would like something for constipation; updated Darrick Grinder, NP.   Vital signs: Temp: 97.4 HR: 54 Doppler Pressure:   Automatic BP:   O2 Sat: 100% on RA Wt: 180>180>185 lbs   LVAD interrogation reveals:  Speed: 9600 Flow: 6.0 Power:  6.6 PI: 3.4 Alarms: none Events:  12 PI events Fixed speed: 9600 Low speed limit: 9000  Drive Line:  Right abdominal sorbaview dressing dry and intact with 2 large tegaderms covering. Anchor intact and accurately applied.   Labs:  LDH trend: 257>232>224  INR trend: 2.93> 2.61>2.17  Hgb trend: 7.7>8.1>8.9>9.1   Anticoagulation Plan: -INR Goal:  2.0 - 2.5 -ASA Dose: no ASA due to hx of GI bleed  Blood Products:  -02/23/18>2 units PCs   Device: - Medtronic single lead -Therapies: on at 231 bpm  Adverse Events on VAD: 02/2017> GIB- AVM clipped x2 07/2017> renal failure- HD started 11/2017>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 Margins not clear at neck.   Plan/Recommendations:   1. Weekly VAD drive line dressing changes using weekly kit and no bio patch. Rinse area with sterile saline after cleansing with Chlora prep. Bedside RN may change dressing. Next dressing change due 03/03/18. 2. Please call VAD pager if any VAD equipment or drive line issues.  [name] RN, VAD Coordinator 24/7 VAD pager: [phone]

## 2018-02-25 NOTE — Progress Notes (Signed)
ANTICOAGULATION CONSULT NOTE  Pharmacy Consult for warfarin Indication: LVAD  No Known Allergies  Patient Measurements: Height: 5\' 5"  (165.1 cm) Weight: 185 lb 4.8 oz (84.1 kg) IBW/kg (Calculated) : 61.5   Vital Signs: Temp: 98 F (36.7 C) (04/24 0824) Temp Source: Oral (04/24 0450) BP: 98/78 (04/24 0824) Pulse Rate: 75 (04/24 0450)  Labs: Recent Labs    02/23/18 1659 02/24/18 0303 02/24/18 0722 02/25/18 0607  HGB 7.7* 8.1* 8.9* 9.1*  HCT 23.5* 24.2* 26.5* 27.3*  PLT 202 163  --  146*  LABPROT 30.3* 27.7*  --  24.0*  INR 2.93 2.61  --  2.17  CREATININE 2.33* 3.17*  --  4.16*    Estimated Creatinine Clearance: 16 mL/min (A) (by C-G formula based on SCr of 4.16 mg/dL (H)).   Medical History: Past Medical History:  Diagnosis Date  . AICD (automatic cardioverter/defibrillator) present   . Asthma   . AVM (arteriovenous malformation) of colon 07/07/2017  . CHF (congestive heart failure) ([place])   . Diabetes ([place])   . ESRF (end stage renal failure) ([place]) 07/07/2017   pt receiving hemodialysis Monday- Wednesday-friday, sometimes Saturday  . Kidney disease   . LVAD (left ventricular assist device) present ([place]) 12/2016  . Permanent atrial fibrillation ([place]) 07/07/2017  . Presence of permanent cardiac pacemaker    AICD  . Sleep apnea      Assessment: 71yom with HF Hx LVAD HM2 presented to ED with SOB, feels bad.   INR on admit was supratherapeutic on 2.93 - dose was held 4/22. INR decreased from 2.61 to 2.17 likely related to holding dose previously. Received two units of PRBCs. Hgb 9.1, plts 146, LDH stable at 224. Has hx of ESRD.   PTA warfarin dose 10mg  Tu/TH and 7.5mg  all other days   Goal of Therapy:  INR 2-2.5 Monitor platelets by anticoagulation protocol: Yes   Plan:  Warfarin 10 mg tonight  Would consider resuming home regimen at time of discharge Daily INR and monitor for signs/symptoms of bleeding  Doylene Canard, PharmD Clinical Pharmacist   Pager: [phone] Clinical Phone for 02/25/2018 until 3:30pm: [phone] If after 3:30pm, please call main pharmacy at [phone] 02/25/2018 11:20 AM

## 2018-02-26 ENCOUNTER — Encounter (HOSPITAL_COMMUNITY): Payer: Self-pay | Admitting: Physician Assistant

## 2018-02-26 DIAGNOSIS — R195 Other fecal abnormalities: Secondary | ICD-10-CM

## 2018-02-26 LAB — GLUCOSE, CAPILLARY
GLUCOSE-CAPILLARY: 152 mg/dL — AB (ref 65–99)
GLUCOSE-CAPILLARY: 54 mg/dL — AB (ref 65–99)
Glucose-Capillary: 81 mg/dL (ref 65–99)
Glucose-Capillary: 144 mg/dL — ABNORMAL HIGH (ref 65–99)
Glucose-Capillary: 71 mg/dL (ref 65–99)
Glucose-Capillary: 139 mg/dL — ABNORMAL HIGH (ref 65–99)

## 2018-02-26 LAB — CBC
HEMATOCRIT: 25.2 % — AB (ref 39.0–52.0)
Hemoglobin: 8.4 g/dL — ABNORMAL LOW (ref 13.0–17.0)
MCH: 30.8 pg (ref 26.0–34.0)
MCHC: 33.3 g/dL (ref 30.0–36.0)
MCV: 92.3 fL (ref 78.0–100.0)
Platelets: 151 10*3/uL (ref 150–400)
RBC: 2.73 MIL/uL — ABNORMAL LOW (ref 4.22–5.81)
RDW: 19.7 % — AB (ref 11.5–15.5)
WBC: 10.1 10*3/uL (ref 4.0–10.5)

## 2018-02-26 LAB — BASIC METABOLIC PANEL
Anion gap: 12 (ref 5–15)
BUN: 48 mg/dL — ABNORMAL HIGH (ref 6–20)
CALCIUM: 8.2 mg/dL — AB (ref 8.9–10.3)
CO2: 25 mmol/L (ref 22–32)
CREATININE: 4.76 mg/dL — AB (ref 0.61–1.24)
Chloride: 93 mmol/L — ABNORMAL LOW (ref 101–111)
GFR calc non Af Amer: 11 mL/min — ABNORMAL LOW (ref >60)
GFR, EST AFRICAN AMERICAN: 13 mL/min — AB (ref >60)
Glucose, Bld: 175 mg/dL — ABNORMAL HIGH (ref 65–99)
Potassium: 3.8 mmol/L (ref 3.5–5.1)
Sodium: 130 mmol/L — ABNORMAL LOW (ref 135–145)

## 2018-02-26 LAB — LACTATE DEHYDROGENASE: LDH: 235 U/L — AB (ref 98–192)

## 2018-02-26 LAB — PROTIME-INR
INR: 2.05
PROTHROMBIN TIME: 23 s — AB (ref 11.4–15.2)

## 2018-02-26 LAB — PHOSPHORUS: PHOSPHORUS: 4.3 mg/dL (ref 2.5–4.6)

## 2018-02-26 LAB — ALBUMIN: Albumin: 3.4 g/dL — ABNORMAL LOW (ref 3.5–5.0)

## 2018-02-26 LAB — OCCULT BLOOD X 1 CARD TO LAB, STOOL: FECAL OCCULT BLD: POSITIVE — AB

## 2018-02-26 MED ORDER — SODIUM CHLORIDE 0.9 % IV SOLN
100.0000 mL | INTRAVENOUS | Status: DC | PRN
  Administered 2018-02-27: 10:00:00 via INTRAVENOUS

## 2018-02-26 MED ORDER — SODIUM CHLORIDE 0.9 % IV SOLN: 100.0000 mL | INTRAVENOUS | Status: DC | PRN

## 2018-02-26 MED ORDER — PEG-KCL-NACL-NASULF-NA ASC-C 100 G PO SOLR: 1.0000 | Freq: Once | ORAL | Status: DC

## 2018-02-26 MED ORDER — PENTAFLUOROPROP-TETRAFLUOROETH EX AERO: 1.0000 "application " | INHALATION_SPRAY | CUTANEOUS | Status: DC | PRN

## 2018-02-26 MED ORDER — LIDOCAINE-PRILOCAINE 2.5-2.5 % EX CREA: 1.0000 "application " | TOPICAL_CREAM | CUTANEOUS | Status: DC | PRN

## 2018-02-26 MED ORDER — HEPARIN SODIUM (PORCINE) 1000 UNIT/ML DIALYSIS: 1000.0000 [IU] | INTRAMUSCULAR | Status: DC | PRN

## 2018-02-26 MED ORDER — LIDOCAINE HCL (PF) 1 % IJ SOLN: 5.0000 mL | INTRAMUSCULAR | Status: DC | PRN

## 2018-02-26 MED ORDER — PEG-KCL-NACL-NASULF-NA ASC-C 100 G PO SOLR
0.5000 | Freq: Once | ORAL | Status: AC
  Administered 2018-02-26: 100 g via ORAL
  Filled 2018-02-26: qty 1

## 2018-02-26 NOTE — Progress Notes (Signed)
HD tx completed @ 0700 w/o problem, UF goal met, blood rinsed back, VSS, report given to Sharlyne Pacas, RN

## 2018-02-26 NOTE — Progress Notes (Signed)
ANTICOAGULATION CONSULT NOTE  Pharmacy Consult for warfarin Indication: LVAD  No Known Allergies  Patient Measurements: Height: 5\' 5"  (165.1 cm) Weight: 183 lb 10.3 oz (83.3 kg) IBW/kg (Calculated) : 61.5   Vital Signs: Temp: 97.7 F (36.5 C) (04/25 0730) Temp Source: Oral (04/25 0730) BP: 91/79 (04/25 0730) Pulse Rate: 77 (04/25 0730)  Labs: Recent Labs    02/24/18 0303 02/24/18 0722 02/25/18 0607 02/26/18 0204  HGB 8.1* 8.9* 9.1* 8.4*  HCT 24.2* 26.5* 27.3* 25.2*  PLT 163  --  146* 151  LABPROT 27.7*  --  24.0* 23.0*  INR 2.61  --  2.17 2.05  CREATININE 3.17*  --  4.16* 4.76*    Estimated Creatinine Clearance: 13.9 mL/min (A) (by C-G formula based on SCr of 4.76 mg/dL (H)).   Medical History: Past Medical History:  Diagnosis Date  . AICD (automatic cardioverter/defibrillator) present   . Asthma   . AVM (arteriovenous malformation) of colon 07/07/2017  . CHF (congestive heart failure) ([place])   . Diabetes ([place])   . ESRF (end stage renal failure) ([place]) 07/07/2017   pt receiving hemodialysis Monday- Wednesday-friday, sometimes Saturday  . Kidney disease   . LVAD (left ventricular assist device) present ([place]) 12/2016  . Permanent atrial fibrillation ([place]) 07/07/2017  . Presence of permanent cardiac pacemaker    AICD  . Sleep apnea      Assessment: 71yom with HF Hx LVAD HM2 presented to ED with SOB, feels bad.   INR on admit was supratherapeutic on 2.93 - dose was held 4/22. INR decreased from 2.17 to 2.05 likely related to holding dose previously. Received two units of PRBCs on 4/22. Hgb trending down 8.4, plts 151, LDH stable at 224. Has hx of ESRD.   PTA warfarin dose 10mg  Tu/TH and 7.5mg  all other days   Has hx of colonic AVMs on colonoscopy on 4/18 admission. Plan for GI workup given dark stools , FOBT pending, possible scope tomorrow.   Goal of Therapy:  INR 2-2.5 Monitor platelets by anticoagulation protocol: Yes   Plan:  Hold warfarin therapy  tonight Daily INR and monitor for signs/symptoms of bleeding Will follow up with HF team for need of heparin infusion if INR<1.8  Doylene Canard, PharmD Clinical Pharmacist  Pager: [phone] Clinical Phone for 02/26/2018 until 3:30pm: [phone] If after 3:30pm, please call main pharmacy at [phone] 02/26/2018 12:14 PM

## 2018-02-26 NOTE — H&P (View-Only) (Signed)
Consultation  Referring Provider: [name] Primary Care Physician:  Janith Lima, MD Primary Gastroenterologist: Dr. Ardis Hughs        Reason for Consultation: Melena, anemia            HPI:   Trevor [name] is a 72 y.o. male with a past medical history as listed below including ischemic cardiomyopathy, CHF, MI status post CABG in [place] in 2014, status post TAVR 2015, status post ICD's x2, A. fib, CHF, ESRD on hemodialysis Monday Wednesday Friday, OSA, COPD, LVAD on Coumadin, who presented to the hospital on 02/23/2018 for generalized weakness.  Patient has been seen in the heart failure clinic today and sent to the ED for admission due to a finding of worsening anemia on lab work in clinic.    Today, explains that as far as his GI system goes, he was slightly constipated, with his last bowel movement prior to admission 4 days ago, this was "a little dark", he did not have a bowel movement then until yesterday, and this was "very dark/black". Only one bowel movement per patient. Apparently this was collected for Hemoccult studies but this never got to the lab per nursing. Patient describes only occasional heartburn and reflux symptoms. No increase recently. No use of over-the-counter NSAIDs.    Patient believes that he was having dark stool prior to his last colonoscopy in 2018 as well.    Denies fever, chills, weight loss, anorexia, nausea, vomiting or symptoms that awaken him at night.  ER course: Hemoglobin initially 7.7, 8.9 with 2 units PRBCs  Previous GI history: 02/14/2017 colonoscopy-fresh blood throughout the colon but none in the terminal ileum, actively oozing pinpoint AVMs of the hepatic flexure which were treated with APC and 2 endoclips, 3, 2-4 mm polyp seen in the sigmoid and descending colon which were not removed, left colon diverticulosis  Past Medical History:  Diagnosis Date  . AICD (automatic cardioverter/defibrillator) present   . Asthma   . AVM (arteriovenous  malformation) of colon 07/07/2017  . CHF (congestive heart failure) ([place])   . Diabetes ([place])   . ESRF (end stage renal failure) ([place]) 07/07/2017   pt receiving hemodialysis Monday- Wednesday-friday, sometimes Saturday  . Kidney disease   . LVAD (left ventricular assist device) present ([place]) 12/2016  . Permanent atrial fibrillation ([place]) 07/07/2017  . Presence of permanent cardiac pacemaker    AICD  . Sleep apnea     Past Surgical History:  Procedure Laterality Date  . AORTIC VALVE REPLACEMENT  2015   [place]  . APPLICATION OF A-CELL OF EXTREMITY Left 11/20/2017   Procedure: APPLICATION OF A-CELL;  Surgeon: Wallace Going, DO;  Location: [facility];  Service: Plastics;  Laterality: Left;  . AV FISTULA PLACEMENT Left 06/30/2017   Procedure: CREATION of LEFT ARM Brachiocephalic Fistula;  Surgeon: Conrad [place], MD;  Location: [facility];  Service: Vascular;  Laterality: Left;  . CARDIAC CATHETERIZATION N/A 12/02/2016   Procedure: Right Heart Cath;  Surgeon: Larey Dresser, MD;  Location: [facility];  Service: Cardiovascular;  Laterality: N/A;  . COLONOSCOPY N/A 02/14/2017   Procedure: COLONOSCOPY;  Surgeon: Milus Banister, MD;  Location: [facility];  Service: Endoscopy;  Laterality: N/A;  . CORONARY ANGIOPLASTY WITH STENT PLACEMENT  2013   in [place]  . CORONARY ARTERY BYPASS GRAFT  2010   in [place]  . DIALYSIS/PERMA CATHETER INSERTION  06/30/2017   Procedure: INSERTION Dialysis Catheter;  Surgeon: Conrad [place], MD;  [facility];  Service: Vascular;;  . IABP INSERTION N/A 12/11/2016   Procedure: IABP Insertion;  Surgeon: Larey Dresser, MD;  Location: [facility];  Service: Cardiovascular;  Laterality: N/A;  . INSERTION OF IMPLANTABLE LEFT VENTRICULAR ASSIST DEVICE N/A 12/13/2016   Procedure: INSERTION OF IMPLANTABLE LEFT VENTRICULAR ASSIST DEVICE;  Surgeon: Ivin Poot, MD;  Location: [facility];  Service: Open Heart Surgery;  Laterality: N/A;  HEARTMATE II  NITRIC OXIDE  .  IR FLUORO GUIDE CV LINE LEFT  06/17/2017  . IR US GUIDE VASC ACCESS LEFT  06/17/2017  . MASS EXCISION Left 11/20/2017   Procedure: EXCISION OF LEFT NECK AND RIGHT LEG SKIN CANCER WITH A CELL PLACEMENT;  Surgeon: Wallace Going, DO;  Location: [facility];  Service: Plastics;  Laterality: Left;  Marland Kitchen MASS EXCISION Left 11/26/2017   Procedure: EXPLORATION AND EXCISION OF LEFT NECK;  Surgeon: Wallace Going, DO;  Location: [facility];  Service: Plastics;  Laterality: Left;  . RIGHT HEART CATH N/A 12/11/2016   Procedure: Right Heart Cath;  Surgeon: Larey Dresser, MD;  Location: [facility];  Service: Cardiovascular;  Laterality: N/A;  . RIGHT HEART CATH N/A 03/27/2017   Procedure: Right Heart Cath;  Surgeon: Larey Dresser, MD;  Location: [facility];  Service: Cardiovascular;  Laterality: N/A;  . RIGHT HEART CATH N/A 06/13/2017   Procedure: RIGHT HEART CATH;  Surgeon: Jolaine Artist, MD;  Location: [facility];  Service: Cardiovascular;  Laterality: N/A;  . TEE WITHOUT CARDIOVERSION N/A 12/13/2016   Procedure: TRANSESOPHAGEAL ECHOCARDIOGRAM (TEE);  Surgeon: Ivin Poot, MD;  Location: [facility];  Service: Open Heart Surgery;  Laterality: N/A;  . TRICUSPID VALVE REPLACEMENT N/A 12/13/2016   Procedure: TRICUSPID VALVE REPAIR WITH EDWARDS [facility] 3 TRICUSPID ANNULOPLASTY RING MODEL 4900 SIZE T 28;  Surgeon: Ivin Poot, MD;  Location: [facility];  Service: Open Heart Surgery;  Laterality: N/A;    Family History  Problem Relation Age of Onset  . Heart failure Father   . Heart attack Father   . Healthy Mother   . Diabetes Paternal Grandfather      Social History   Tobacco Use  . Smoking status: Never Smoker  . Smokeless tobacco: Never Used  Substance Use Topics  . Alcohol use: No  . Drug use: No    Prior to Admission medications   Medication Sig Start Date End Date Taking? Authorizing Provider  albuterol (PROVENTIL HFA;VENTOLIN HFA) 108 (90 Base) MCG/ACT inhaler Inhale 2 puffs  into the lungs every 4 (four) hours as needed for wheezing or shortness of breath. 02/10/18  Yes Larey Dresser, MD  albuterol (PROVENTIL) (2.5 MG/3ML) 0.083% nebulizer solution Take 3 mLs (2.5 mg total) by nebulization every 4 (four) hours. And as needed Patient taking differently: Take 2.5 mg by nebulization every 4 (four) hours as needed for wheezing or shortness of breath.  11/27/16  Yes Byrum, Rose Fillers, MD  atorvastatin (LIPITOR) 40 MG tablet TAKE 1 TABLET(40 MG) BY MOUTH DAILY Patient taking differently: Take 40 mg by mouth daily at 6 PM.  09/02/17  Yes Larey Dresser, MD  busPIRone (BUSPAR) 5 MG tablet Take 1 tablet (5 mg total) by mouth 2 (two) times daily. 01/22/18  Yes Bensimhon, Shaune Pascal, MD  calcitRIOL (ROCALTROL) 0.5 MCG capsule Take 1 capsule (0.5 mcg total) by mouth every other day. Patient taking differently: Take 0.5 mcg by mouth every Monday, Wednesday, and Friday  with hemodialysis.  07/08/17  Yes Bensimhon, Shaune Pascal, MD  docusate sodium (COLACE) 100 MG capsule Take 1 capsule (100 mg total) by mouth 2 (two) times daily. Patient taking differently: Take 100 mg daily by mouth.  04/03/17  Yes Larey Dresser, MD  ferric citrate (AURYXIA) 1 GM 210 MG(Fe) tablet Take 420 mg by mouth 3 (three) times daily with meals.   Yes [provider]  fluticasone (FLOVENT HFA) 110 MCG/ACT inhaler Inhale 2 puffs into the lungs 2 (two) times daily. 02/10/18  Yes Larey Dresser, MD  gabapentin (NEURONTIN) 600 MG tablet Take 0.5 tablets (300 mg total) by mouth 2 (two) times daily. 07/23/17  Yes Larey Dresser, MD  insulin glargine (LANTUS) 100 unit/mL SOPN Inject 0.25 mLs (25 Units total) into the skin at bedtime. 12/18/17  Yes Clegg, Amy D, NP  levothyroxine (SYNTHROID, LEVOTHROID) 25 MCG tablet Take 1 tablet (25 mcg total) by mouth daily before breakfast. 10/02/17  Yes Larey Dresser, MD  magnesium oxide (MAG-OX) 400 MG tablet Take 400 mg by mouth daily.   Yes [provider]    multivitamin (RENA-VIT) TABS tablet Take 1 tablet by mouth at bedtime. 09/02/17  Yes Larey Dresser, MD  pantoprazole (PROTONIX) 40 MG tablet Take 1 tablet (40 mg total) by mouth daily. 08/11/17  Yes Clegg, Amy D, NP  sevelamer carbonate (RENVELA) 800 MG tablet Take 1,600-2,400 mg by mouth See admin instructions. Take 2,400 mg by mouth three times a day with meals and 1,600 mg two times a day with snacks 02/03/18  Yes [provider]  sildenafil (REVATIO) 20 MG tablet Take 2 tablets (40 mg total) 3 (three) times daily by mouth. Patient taking differently: Take 40 mg by mouth See admin instructions. Take 40 mg by mouth three times a day- midday, evening, and bedtime 09/11/17  Yes Larey Dresser, MD  traMADol (ULTRAM) 50 MG tablet Take 1 tablet (50 mg total) by mouth every 6 (six) hours as needed. TAKE 1 TABLET BY MOUTH EVERY 6 HOURS AS NEEDED FOR MODERATE PAIN Patient taking differently: Take 50 mg by mouth every 6 (six) hours as needed for moderate pain.  01/01/18  Yes Larey Dresser, MD  traZODone (DESYREL) 100 MG tablet TAKE 1 TABLET BY MOUTH EVERY NIGHT AT BEDTIME AS NEEDED FOR SLEEP Patient taking differently: Take 100 mg by mouth at bedtime 02/16/18  Yes Larey Dresser, MD  warfarin (COUMADIN) 5 MG tablet Take 7.5-10 mg by mouth See admin instructions. Take 7.5 mg by mouth in the evening with dinner on Sun/Mon/Wed/Fri/Sat and 10 mg on Tues/Thurs   Yes [provider]    Current Facility-Administered Medications  Medication Dose Route Frequency Provider Last Rate Last Dose  . 0.9 %  sodium chloride infusion  100 mL Intravenous PRN Ejigiri, Thomos Lemons, PA-C      . 0.9 %  sodium chloride infusion  100 mL Intravenous PRN Ejigiri, Thomos Lemons, PA-C      . acetaminophen (TYLENOL) tablet 650 mg  650 mg Oral Q4H PRN Shirley Friar, PA-C   650 mg at 02/25/18 0901  . albuterol (PROVENTIL) (2.5 MG/3ML) 0.083% nebulizer solution 2.5 mg  2.5 mg Nebulization Q4H PRN Shirley Friar, PA-C      . atorvastatin (LIPITOR) tablet 40 mg  40 mg Oral q1800 Shirley Friar, PA-C   40 mg at 02/25/18 1754  . budesonide (PULMICORT) nebulizer solution 0.25 mg  0.25 mg Inhalation BID Barrington Ellison  Mitzi Hansen, PA-C   0.25 mg at 02/26/18 0900  . busPIRone (BUSPAR) tablet 5 mg  5 mg Oral BID Shirley Friar, PA-C   5 mg at 02/26/18 0931  . calcitRIOL (ROCALTROL) capsule 0.5 mcg  0.5 mcg Oral Q M,W,F-HD Shirley Friar, PA-C   0.5 mcg at 02/25/18 1200  . citalopram (CELEXA) tablet 20 mg  20 mg Oral Daily Shirley Friar, PA-C   20 mg at 02/26/18 0931  . docusate sodium (COLACE) capsule 100 mg  100 mg Oral Daily Shirley Friar, PA-C   100 mg at 02/26/18 0930  . doxycycline (VIBRA-TABS) tablet 100 mg  100 mg Oral Q12H Shirley Friar, PA-C   100 mg at 02/26/18 0930  . gabapentin (NEURONTIN) capsule 300 mg  300 mg Oral BID Shirley Friar, PA-C   300 mg at 02/26/18 0930  . heparin injection 1,000 Units  1,000 Units Dialysis PRN Lynnda Child, PA-C      . insulin aspart (novoLOG) injection 0-5 Units  0-5 Units Subcutaneous QHS Bensimhon, Shaune Pascal, MD   2 Units at 02/25/18 2244  . insulin aspart (novoLOG) injection 0-9 Units  0-9 Units Subcutaneous TID WC Bensimhon, Shaune Pascal, MD   2 Units at 02/25/18 1758  . insulin glargine (LANTUS) injection 15 Units  15 Units Subcutaneous QHS Shirley Friar, PA-C   15 Units at 02/25/18 2243  . levothyroxine (SYNTHROID, LEVOTHROID) tablet 25 mcg  25 mcg Oral QAC breakfast Shirley Friar, PA-C   25 mcg at 02/26/18 0754  . lidocaine (PF) (XYLOCAINE) 1 % injection 5 mL  5 mL Intradermal PRN Lynnda Child, PA-C      . lidocaine-prilocaine (EMLA) cream 1 application  1 application Topical PRN Lynnda Child, PA-C      . magnesium oxide (MAG-OX) tablet 400 mg  400 mg Oral Daily Shirley Friar, PA-C   400 mg at 02/26/18 0930  . multivitamin (RENA-VIT)  tablet 1 tablet  1 tablet Oral QHS Shirley Friar, PA-C   1 tablet at 02/25/18 2242  . ondansetron (ZOFRAN) injection 4 mg  4 mg Intravenous Q6H PRN Shirley Friar, PA-C      . pantoprazole (PROTONIX) EC tablet 40 mg  40 mg Oral Daily Shirley Friar, PA-C   40 mg at 02/26/18 0930  . pentafluoroprop-tetrafluoroeth (GEBAUERS) aerosol 1 application  1 application Topical PRN Ejigiri, Thomos Lemons, PA-C      . polyethylene glycol (MIRALAX / GLYCOLAX) packet 17 g  17 g Oral Once Shirley Friar, PA-C      . sevelamer carbonate (RENVELA) tablet 3,200 mg  3,200 mg Oral TID WC Lynnda Child, PA-C   3,200 mg at 02/26/18 0754  . sildenafil (REVATIO) tablet 40 mg  40 mg Oral TID Shirley Friar, PA-C   40 mg at 02/26/18 0930  . traMADol (ULTRAM) tablet 50-100 mg  50-100 mg Oral Q12H PRN Shirley Friar, PA-C   100 mg at 02/25/18 2242  . traZODone (DESYREL) tablet 100 mg  100 mg Oral QHS PRN Shirley Friar, PA-C   100 mg at 02/25/18 2242  . Warfarin - Pharmacist Dosing Inpatient   Does not apply q1800 Bensimhon, Shaune Pascal, MD        Allergies as of 02/23/2018  . (No Known Allergies)     Review of Systems:     Constitutional: No weight loss, fever or chills Skin: No rash  Cardiovascular: No chest pain  Respiratory: No SOB Gastrointestinal: See HPI and otherwise negative Genitourinary: No dysuria Neurological: No headache, dizziness or syncope Musculoskeletal: No new muscle or joint pain Hematologic: No bleeding  Psychiatric: No history of depression or anxiety   Physical Exam:  Vital signs in last 24 hours: Temp:  [97.4 F (36.3 C)-97.7 F (36.5 C)] 97.7 F (36.5 C) (04/25 0730) Pulse Rate:  [40-96] 77 (04/25 0730) Resp:  [9-29] 18 (04/25 0730) BP: (73-101)/(46-88) 91/79 (04/25 0730) SpO2:  [93 %-100 %] 93 % (04/25 0730) Weight:  [183 lb 10.3 oz (83.3 kg)-188 lb 0.8 oz (85.3 kg)] 183 lb 10.3 oz (83.3 kg) (04/25 0730) Last BM  Date: 02/25/18 General:   Pleasant Caucasian male appears to be in NAD, Well developed, Well nourished, alert and cooperative Head:  Normocephalic and atraumatic. Eyes:   PEERL, EOMI. No icterus. Conjunctiva pink. Ears:  Normal auditory acuity. Neck:  Supple, +JVD Throat: Oral cavity and pharynx without inflammation, swelling or lesion. Teeth in good condition. Lungs: Respirations even and unlabored. Lungs clear to auscultation bilaterally.   No wheezes, crackles, or rhonchi.  Heart: LVAD hum, no cyanosis or pallor.  Abdomen:  Soft, nondistended, nontender. No rebound or guarding. Normal bowel sounds. No appreciable masses or hepatomegaly. Rectal:  Not performed.  Msk:  Symmetrical without gross deformities. Peripheral pulses intact.  Extremities:  1+ edema, no deformity or joint abnormality.  Neurologic:  Alert and  oriented x4;  grossly normal neurologically.  Skin:   Dry and intact without significant lesions or rashes. Psychiatric: Demonstrates good judgement and reason without abnormal affect or behaviors.  LAB RESULTS: Recent Labs    02/24/18 0303 02/24/18 0722 02/25/18 0607 02/26/18 0204  WBC 10.3  --  9.3 10.1  HGB 8.1* 8.9* 9.1* 8.4*  HCT 24.2* 26.5* 27.3* 25.2*  PLT 163  --  146* 151   BMET Recent Labs    02/24/18 0303 02/25/18 0607 02/26/18 0204  NA 130* 133* 130*  K 3.7 3.5 3.8  CL 92* 94* 93*  CO2 28 27 25   GLUCOSE 286* 69 175*  BUN 25* 38* 48*  CREATININE 3.17* 4.16* 4.76*  CALCIUM 8.3* 8.6* 8.2*   LFT Recent Labs    02/26/18 0204  ALBUMIN 3.4*   PT/INR Recent Labs    02/25/18 0607 02/26/18 0204  LABPROT 24.0* 23.0*  INR 2.17 2.05     Impression / Plan:   Impression: 1.  Symptomatic anemia: Hemoglobin initially 7.7--> 2 units PRBCs--> 8.9 -> 9.1 -> 8.4 with dark stools , last colonoscopy 4/18 with multiple AVM's APC and clipping 2.  Acute bronchitis/URI: improving on doxycycline 3.  Acute on chronic systolic CHF: Ischemic cardiomyopathy,  EF 20-25% with RV dysfunction on 2/18 echo pre-LVAD, status post HeartMate 2 LVAD 2/18, Medtronic ICD, RV failure postop, developed worsening renal dysfunction and started hemodialysis on 8/18, much more short of breath than usual, despite volume status looking stable 4.  ESRD 5.  A. fib: On Coumadin, INR 2.05 today  Plan: 1. Scheduled EGD+ Colonoscopy with Dr. Hilarie Fredrickson tomorrow. Did review risks, benefits, limitations and alternatives and the patient agrees to proceed. 2. Continue to monitor hemoglobin with transfusion as needed. 3. Patient to remain on clears for the remainder of the day and NPO after midnight. 4. Start Moviprep today 5. Please await any further recommendations from Dr. Hilarie Fredrickson later today.  Thank you for your kind consultation, we will continue to follow.  Lavone Nian [facility]  02/26/2018, 11:57 AM Pager #: [phone]

## 2018-02-26 NOTE — Progress Notes (Addendum)
Advanced Heart Failure VAD Team Note  PCP-Cardiologist: No primary care provider on file.   Subjective:    Hgb 7.7 -> s/p 2 UPRBCs -> 8.9 -> 9.1 -> 8.4 with dark stools.   Feeling fatigued after HD.  Paresthesias in hands and feet worse again after dialysis. Now having Dark stools and Hgb trending back down.  No CP or SOB  LVAD Interrogation HM 2: Speed: 9600 Flow: 6.2 PI: 3.6 Power: 7.0. 4 PI events  Objective:    Vital Signs:   Temp:  [97.4 F (36.3 C)-97.7 F (36.5 C)] 97.7 F (36.5 C) (04/25 0730) Pulse Rate:  [40-96] 77 (04/25 0730) Resp:  [9-29] 18 (04/25 0730) BP: (73-101)/(46-88) 91/79 (04/25 0730) SpO2:  [93 %-100 %] 93 % (04/25 0730) Weight:  [183 lb 10.3 oz (83.3 kg)-188 lb 0.8 oz (85.3 kg)] 183 lb 10.3 oz (83.3 kg) (04/25 0730) Last BM Date: 02/25/18   Mean arterial Pressure 80s  Intake/Output:   Intake/Output Summary (Last 24 hours) at 02/26/2018 0959 Last data filed at 02/26/2018 0900 Gross per 24 hour  Intake 720 ml  Output 2450 ml  Net -1730 ml     Physical Exam    GENERAL: Well appearing this am. NAD.  HEENT: Normal. Anicteric  NECK: Supple, JVP 7-8 cm. Carotids OK.  CARDIAC:  Mechanical heart sounds with LVAD hum present.  LUNGS:  CTAB, normal effort. No wheeze ABDOMEN:  NT, ND, no HSM. No bruits or masses. +BS  LVAD exit site: Well-healed and incorporated. Dressing dry and intact. No erythema or drainage. Stabilization device present and accurately applied. Driveline dressing changed daily per sterile technique. EXTREMITIES:  Warm and dry. No cyanosis, clubbing, or rash. Trace to 1+ edema.  Neuro: alert & oriented x 3, cranial nerves grossly intact. moves all 4 extremities w/o difficulty. Affect pleasant     Telemetry   Afib 60-70s, personally reviewed.   EKG    No new tracings.    Labs   Basic Metabolic Panel: Recent Labs  Lab 02/23/18 1659 02/24/18 0303 02/25/18 0607 02/26/18 0204  NA 130* 130* 133* 130*  K 3.6 3.7 3.5 3.8    CL 91* 92* 94* 93*  CO2 27 28 27 25   GLUCOSE 295* 286* 69 175*  BUN 15 25* 38* 48*  CREATININE 2.33* 3.17* 4.16* 4.76*  CALCIUM 8.4* 8.3* 8.6* 8.2*  PHOS  --   --   --  4.3    Liver Function Tests: Recent Labs  Lab 02/26/18 0204  ALBUMIN 3.4*   No results for input(s): LIPASE, AMYLASE in the last 168 hours. No results for input(s): AMMONIA in the last 168 hours.  CBC: Recent Labs  Lab 02/23/18 1659 02/24/18 0303 02/24/18 0722 02/25/18 0607 02/26/18 0204  WBC 13.5* 10.3  --  9.3 10.1  HGB 7.7* 8.1* 8.9* 9.1* 8.4*  HCT 23.5* 24.2* 26.5* 27.3* 25.2*  MCV 96.3 94.5  --  92.5 92.3  PLT 202 163  --  146* 151    INR: Recent Labs  Lab 02/23/18 1659 02/24/18 0303 02/25/18 0607 02/26/18 0204  INR 2.93 2.61 2.17 2.05    Other results:  EKG:    Imaging   No results found.   Medications:     Scheduled Medications: . atorvastatin  40 mg Oral q1800  . budesonide  0.25 mg Inhalation BID  . busPIRone  5 mg Oral BID  . calcitRIOL  0.5 mcg Oral Q M,W,F-HD  . citalopram  20 mg Oral Daily  .  docusate sodium  100 mg Oral Daily  . doxycycline  100 mg Oral Q12H  . gabapentin  300 mg Oral BID  . insulin aspart  0-5 Units Subcutaneous QHS  . insulin aspart  0-9 Units Subcutaneous TID WC  . insulin glargine  15 Units Subcutaneous QHS  . levothyroxine  25 mcg Oral QAC breakfast  . magnesium oxide  400 mg Oral Daily  . multivitamin  1 tablet Oral QHS  . pantoprazole  40 mg Oral Daily  . polyethylene glycol  17 g Oral Once  . sevelamer carbonate  3,200 mg Oral TID WC  . sildenafil  40 mg Oral TID  . Warfarin - Pharmacist Dosing Inpatient   Does not apply q1800    Infusions: . sodium chloride    . sodium chloride      PRN Medications: sodium chloride, sodium chloride, acetaminophen, albuterol, heparin, lidocaine (PF), lidocaine-prilocaine, ondansetron (ZOFRAN) IV, pentafluoroprop-tetrafluoroeth, traMADol, traZODone   Patient Profile   Trevor [name] is a  72 y.o. male with a history of CAD s/p CABG x 4 2010, OSA, AS with TAVR 2015, , ESRD on HD, DM2, chronic atrial fibrillation, asthma, and ischemic cardiomyopathy with [employer] ICD.  S/p HMII LVAD for Destination therapy on 12/14/2015.   Presented to [facility]/23/18 with worsening SOB and malaise s/p HD.   Assessment/Plan:    1. Symptomatic anemia - Hgb 7.7 -> 8.9 s/p 2 uPRBCs -> 9.1 -> 8.4 Now with Dark stools.  - FOBT pending.  - GI to see.  2. Acute bronchitis/URI - Covering with course of doxy with improvement. - Will complete 7 day course.  3. Acute on chronic systolic CHF: Ischemic cardiomyopathy, EF 20-25% with RV dysfunction on 2/18 echo pre-LVAD. 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 Medtronic ICD. He has had problems with RV failure post-op. He developed worsening renal dysfunction and finally had to start HD in 8/18. There has been some difficulty getting adequate fluid off him, currently dialyzing 4 days/week.  - Breathing much improved. Tolerated HD overnight OK. But still feels worn out with general malaise and paraesthesias afterward.  - Continue warfarin with INR goal 2-2.5, no ASA.INR trending down. Discussed dose with Pharm-D personally.  - Will discuss with Renal optimal volume status and timing to improve QOL. Would try and get back to 3 times a week HD if possible.   Could also consider PD if a candidate.  4. CAD: s/p CABG.  - No s/s of ischemia.    - He is on atorvastatin.  5. ESRD:  - HD overnight. Fistula has matured and being used.  - With overall poor tolerance, may be able to consider PD.  6. Atrial fibrillation: Chronic.  - Rate controlled.  - On coumadin. INR 2.05. On goal. Discussed dosing with Pharm-D personally. Hold tonight.  7. RV failure:  - Continue revatio 40 mg TID.  - No change to current plan.   8. HTN:  - MAP stable. Lower on HD days.  9. 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  - Now with Dark Stools.  GI to see.  Ideally would scope tomorrow if possible. FOBT pending.  - Continue octreotide injections.  10. 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  - Holding off on radiation. He does not think he can handle radiation as well as HD 4 days/week.  - No change to current plan.   11. H/o  TAVR:  - Stable on recent Echo.   I reviewed the LVAD parameters from today, and compared the results to the patient's prior recorded data.  No programming changes were made.  The LVAD is functioning within specified parameters.  The patient performs LVAD self-test daily.  LVAD interrogation was negative for any significant power changes, alarms or PI events/speed drops.  LVAD equipment check completed and is in good working order.  Back-up equipment present.   LVAD education done on emergency procedures and precautions and reviewed exit site care.  Length of Stay: 3  Annamaria Helling 02/26/2018, 9:59 AM  VAD Team --- VAD ISSUES ONLY--- Pager [phone] (7am - 7am)  Advanced Heart Failure Team  Pager [phone] (M-F; 7a - 4p)  Please contact [facility] for night-coverage after hours (4p -7a ) and weekends on [email or web address]  Patient seen and examined with the above-signed Advanced Practice Provider and/or Housestaff. I personally reviewed laboratory data, imaging studies and relevant notes. I independently examined the patient and formulated the important aspects of the plan. I have edited the note to reflect any of my changes or salient points. I have personally discussed the plan with the patient and/or family.  Continues to feel very fatigued after HD. Hard for him to tolerate. Hgb back down as well. Had some dark stools but not sure if frank melena. Have d/w GI and will plan enteroscopy tomorrow.   We have reviewed the literature on PD in patients on LVAD support and several case reports seem very favorable. I  discussed with Dr. Marval Regal who agreed that it may be a good option for him given home much HD has compromised his QOL.I d/w Dr. Prescott Gum who is also in favor. I spoke with [facility] about placing a PD cath and they will see him today.   VAD interrogated personally. Parameters stable. INR 2.05. Will hold coumadin tonight for enteroscopy. Discussed with PharmD personally.  Glori Bickers, MD  3:59 PM

## 2018-02-26 NOTE — Consult Note (Signed)
Consultation  Referring Provider: [name] Primary Care Physician:  Janith Lima, MD Primary Gastroenterologist: Dr. Ardis Hughs        Reason for Consultation: Melena, anemia            HPI:   Trevor [name] is a 72 y.o. male with a past medical history as listed below including ischemic cardiomyopathy, CHF, MI status post CABG in [place] in 2014, status post TAVR 2015, status post ICD's x2, A. fib, CHF, ESRD on hemodialysis Monday Wednesday Friday, OSA, COPD, LVAD on Coumadin, who presented to the hospital on 02/23/2018 for generalized weakness.  Patient has been seen in the heart failure clinic today and sent to the ED for admission due to a finding of worsening anemia on lab work in clinic.    Today, explains that as far as his GI system goes, he was slightly constipated, with his last bowel movement prior to admission 4 days ago, this was "a little dark", he did not have a bowel movement then until yesterday, and this was "very dark/black". Only one bowel movement per patient. Apparently this was collected for Hemoccult studies but this never got to the lab per nursing. Patient describes only occasional heartburn and reflux symptoms. No increase recently. No use of over-the-counter NSAIDs.    Patient believes that he was having dark stool prior to his last colonoscopy in 2018 as well.    Denies fever, chills, weight loss, anorexia, nausea, vomiting or symptoms that awaken him at night.  ER course: Hemoglobin initially 7.7, 8.9 with 2 units PRBCs  Previous GI history: 02/14/2017 colonoscopy-fresh blood throughout the colon but none in the terminal ileum, actively oozing pinpoint AVMs of the hepatic flexure which were treated with APC and 2 endoclips, 3, 2-4 mm polyp seen in the sigmoid and descending colon which were not removed, left colon diverticulosis  Past Medical History:  Diagnosis Date  . AICD (automatic cardioverter/defibrillator) present   . Asthma   . AVM (arteriovenous  malformation) of colon 07/07/2017  . CHF (congestive heart failure) ([place])   . Diabetes ([place])   . ESRF (end stage renal failure) ([place]) 07/07/2017   pt receiving hemodialysis Monday- Wednesday-friday, sometimes Saturday  . Kidney disease   . LVAD (left ventricular assist device) present ([place]) 12/2016  . Permanent atrial fibrillation ([place]) 07/07/2017  . Presence of permanent cardiac pacemaker    AICD  . Sleep apnea     Past Surgical History:  Procedure Laterality Date  . AORTIC VALVE REPLACEMENT  2015   [place]  . APPLICATION OF A-CELL OF EXTREMITY Left 11/20/2017   Procedure: APPLICATION OF A-CELL;  Surgeon: Wallace Going, DO;  Location: [place];  Service: Plastics;  Laterality: Left;  . AV FISTULA PLACEMENT Left 06/30/2017   Procedure: CREATION of LEFT ARM Brachiocephalic Fistula;  Surgeon: Conrad Mokuleia, MD;  Location: [place];  Service: Vascular;  Laterality: Left;  . CARDIAC CATHETERIZATION N/A 12/02/2016   Procedure: Right Heart Cath;  Surgeon: Larey Dresser, MD;  Location: [facility];  Service: Cardiovascular;  Laterality: N/A;  . COLONOSCOPY N/A 02/14/2017   Procedure: COLONOSCOPY;  Surgeon: Milus Banister, MD;  Location: [facility];  Service: Endoscopy;  Laterality: N/A;  . CORONARY ANGIOPLASTY WITH STENT PLACEMENT  2013   in [place]  . CORONARY ARTERY BYPASS GRAFT  2010   in [place]  . DIALYSIS/PERMA CATHETER INSERTION  06/30/2017   Procedure: INSERTION Dialysis Catheter;  Surgeon: Conrad Terlingua, MD;  [facility];  Service: Vascular;;  . IABP INSERTION N/A 12/11/2016   Procedure: IABP Insertion;  Surgeon: Larey Dresser, MD;  Location: [facility];  Service: Cardiovascular;  Laterality: N/A;  . INSERTION OF IMPLANTABLE LEFT VENTRICULAR ASSIST DEVICE N/A 12/13/2016   Procedure: INSERTION OF IMPLANTABLE LEFT VENTRICULAR ASSIST DEVICE;  Surgeon: Ivin Poot, MD;  Location: [facility];  Service: Open Heart Surgery;  Laterality: N/A;  HEARTMATE II  NITRIC OXIDE  .  IR FLUORO GUIDE CV LINE LEFT  06/17/2017  . IR US GUIDE VASC ACCESS LEFT  06/17/2017  . MASS EXCISION Left 11/20/2017   Procedure: EXCISION OF LEFT NECK AND RIGHT LEG SKIN CANCER WITH A CELL PLACEMENT;  Surgeon: Wallace Going, DO;  Location: [place];  Service: Plastics;  Laterality: Left;  Marland Kitchen MASS EXCISION Left 11/26/2017   Procedure: EXPLORATION AND EXCISION OF LEFT NECK;  Surgeon: Wallace Going, DO;  Location: [facility];  Service: Plastics;  Laterality: Left;  . RIGHT HEART CATH N/A 12/11/2016   Procedure: Right Heart Cath;  Surgeon: Larey Dresser, MD;  Location: [facility];  Service: Cardiovascular;  Laterality: N/A;  . RIGHT HEART CATH N/A 03/27/2017   Procedure: Right Heart Cath;  Surgeon: Larey Dresser, MD;  Location: [facility];  Service: Cardiovascular;  Laterality: N/A;  . RIGHT HEART CATH N/A 06/13/2017   Procedure: RIGHT HEART CATH;  Surgeon: Jolaine Artist, MD;  Location: [facility];  Service: Cardiovascular;  Laterality: N/A;  . TEE WITHOUT CARDIOVERSION N/A 12/13/2016   Procedure: TRANSESOPHAGEAL ECHOCARDIOGRAM (TEE);  Surgeon: Ivin Poot, MD;  Location: [place];  Service: Open Heart Surgery;  Laterality: N/A;  . TRICUSPID VALVE REPLACEMENT N/A 12/13/2016   Procedure: TRICUSPID VALVE REPAIR WITH EDWARDS [facility] 3 TRICUSPID ANNULOPLASTY RING MODEL 4900 SIZE T 28;  Surgeon: Ivin Poot, MD;  Location: [facility] [facility];  Service: Open Heart Surgery;  Laterality: N/A;    Family History  Problem Relation Age of Onset  . Heart failure Father   . Heart attack Father   . Healthy Mother   . Diabetes Paternal Grandfather      Social History   Tobacco Use  . Smoking status: Never Smoker  . Smokeless tobacco: Never Used  Substance Use Topics  . Alcohol use: No  . Drug use: No    Prior to Admission medications   Medication Sig Start Date End Date Taking? Authorizing Provider  albuterol (PROVENTIL HFA;VENTOLIN HFA) 108 (90 Base) MCG/ACT inhaler Inhale 2 puffs  into the lungs every 4 (four) hours as needed for wheezing or shortness of breath. 02/10/18  Yes Larey Dresser, MD  albuterol (PROVENTIL) (2.5 MG/3ML) 0.083% nebulizer solution Take 3 mLs (2.5 mg total) by nebulization every 4 (four) hours. And as needed Patient taking differently: Take 2.5 mg by nebulization every 4 (four) hours as needed for wheezing or shortness of breath.  11/27/16  Yes Byrum, Rose Fillers, MD  atorvastatin (LIPITOR) 40 MG tablet TAKE 1 TABLET(40 MG) BY MOUTH DAILY Patient taking differently: Take 40 mg by mouth daily at 6 PM.  09/02/17  Yes Larey Dresser, MD  busPIRone (BUSPAR) 5 MG tablet Take 1 tablet (5 mg total) by mouth 2 (two) times daily. 01/22/18  Yes Bensimhon, Shaune Pascal, MD  calcitRIOL (ROCALTROL) 0.5 MCG capsule Take 1 capsule (0.5 mcg total) by mouth every other day. Patient taking differently: Take 0.5 mcg by mouth every Monday, Wednesday, and Friday  with hemodialysis.  07/08/17  Yes Bensimhon, Shaune Pascal, MD  docusate sodium (COLACE) 100 MG capsule Take 1 capsule (100 mg total) by mouth 2 (two) times daily. Patient taking differently: Take 100 mg daily by mouth.  04/03/17  Yes Larey Dresser, MD  ferric citrate (AURYXIA) 1 GM 210 MG(Fe) tablet Take 420 mg by mouth 3 (three) times daily with meals.   Yes [provider]  fluticasone (FLOVENT HFA) 110 MCG/ACT inhaler Inhale 2 puffs into the lungs 2 (two) times daily. 02/10/18  Yes Larey Dresser, MD  gabapentin (NEURONTIN) 600 MG tablet Take 0.5 tablets (300 mg total) by mouth 2 (two) times daily. 07/23/17  Yes Larey Dresser, MD  insulin glargine (LANTUS) 100 unit/mL SOPN Inject 0.25 mLs (25 Units total) into the skin at bedtime. 12/18/17  Yes Clegg, Amy D, NP  levothyroxine (SYNTHROID, LEVOTHROID) 25 MCG tablet Take 1 tablet (25 mcg total) by mouth daily before breakfast. 10/02/17  Yes Larey Dresser, MD  magnesium oxide (MAG-OX) 400 MG tablet Take 400 mg by mouth daily.   Yes [provider]    multivitamin (RENA-VIT) TABS tablet Take 1 tablet by mouth at bedtime. 09/02/17  Yes Larey Dresser, MD  pantoprazole (PROTONIX) 40 MG tablet Take 1 tablet (40 mg total) by mouth daily. 08/11/17  Yes Clegg, Amy D, NP  sevelamer carbonate (RENVELA) 800 MG tablet Take 1,600-2,400 mg by mouth See admin instructions. Take 2,400 mg by mouth three times a day with meals and 1,600 mg two times a day with snacks 02/03/18  Yes [provider]  sildenafil (REVATIO) 20 MG tablet Take 2 tablets (40 mg total) 3 (three) times daily by mouth. Patient taking differently: Take 40 mg by mouth See admin instructions. Take 40 mg by mouth three times a day- midday, evening, and bedtime 09/11/17  Yes Larey Dresser, MD  traMADol (ULTRAM) 50 MG tablet Take 1 tablet (50 mg total) by mouth every 6 (six) hours as needed. TAKE 1 TABLET BY MOUTH EVERY 6 HOURS AS NEEDED FOR MODERATE PAIN Patient taking differently: Take 50 mg by mouth every 6 (six) hours as needed for moderate pain.  01/01/18  Yes Larey Dresser, MD  traZODone (DESYREL) 100 MG tablet TAKE 1 TABLET BY MOUTH EVERY NIGHT AT BEDTIME AS NEEDED FOR SLEEP Patient taking differently: Take 100 mg by mouth at bedtime 02/16/18  Yes Larey Dresser, MD  warfarin (COUMADIN) 5 MG tablet Take 7.5-10 mg by mouth See admin instructions. Take 7.5 mg by mouth in the evening with dinner on Sun/Mon/Wed/Fri/Sat and 10 mg on Tues/Thurs   Yes [provider]    Current Facility-Administered Medications  Medication Dose Route Frequency Provider Last Rate Last Dose  . 0.9 %  sodium chloride infusion  100 mL Intravenous PRN Ejigiri, Thomos Lemons, PA-C      . 0.9 %  sodium chloride infusion  100 mL Intravenous PRN Ejigiri, Thomos Lemons, PA-C      . acetaminophen (TYLENOL) tablet 650 mg  650 mg Oral Q4H PRN Shirley Friar, PA-C   650 mg at 02/25/18 0901  . albuterol (PROVENTIL) (2.5 MG/3ML) 0.083% nebulizer solution 2.5 mg  2.5 mg Nebulization Q4H PRN Shirley Friar, PA-C      . atorvastatin (LIPITOR) tablet 40 mg  40 mg Oral q1800 Shirley Friar, PA-C   40 mg at 02/25/18 1754  . budesonide (PULMICORT) nebulizer solution 0.25 mg  0.25 mg Inhalation BID Barrington Ellison  Mitzi Hansen, PA-C   0.25 mg at 02/26/18 0900  . busPIRone (BUSPAR) tablet 5 mg  5 mg Oral BID Shirley Friar, PA-C   5 mg at 02/26/18 0931  . calcitRIOL (ROCALTROL) capsule 0.5 mcg  0.5 mcg Oral Q M,W,F-HD Shirley Friar, PA-C   0.5 mcg at 02/25/18 1200  . citalopram (CELEXA) tablet 20 mg  20 mg Oral Daily Shirley Friar, PA-C   20 mg at 02/26/18 0931  . docusate sodium (COLACE) capsule 100 mg  100 mg Oral Daily Shirley Friar, PA-C   100 mg at 02/26/18 0930  . doxycycline (VIBRA-TABS) tablet 100 mg  100 mg Oral Q12H Shirley Friar, PA-C   100 mg at 02/26/18 0930  . gabapentin (NEURONTIN) capsule 300 mg  300 mg Oral BID Shirley Friar, PA-C   300 mg at 02/26/18 0930  . heparin injection 1,000 Units  1,000 Units Dialysis PRN Lynnda Child, PA-C      . insulin aspart (novoLOG) injection 0-5 Units  0-5 Units Subcutaneous QHS Bensimhon, Shaune Pascal, MD   2 Units at 02/25/18 2244  . insulin aspart (novoLOG) injection 0-9 Units  0-9 Units Subcutaneous TID WC Bensimhon, Shaune Pascal, MD   2 Units at 02/25/18 1758  . insulin glargine (LANTUS) injection 15 Units  15 Units Subcutaneous QHS Shirley Friar, PA-C   15 Units at 02/25/18 2243  . levothyroxine (SYNTHROID, LEVOTHROID) tablet 25 mcg  25 mcg Oral QAC breakfast Shirley Friar, PA-C   25 mcg at 02/26/18 0754  . lidocaine (PF) (XYLOCAINE) 1 % injection 5 mL  5 mL Intradermal PRN Lynnda Child, PA-C      . lidocaine-prilocaine (EMLA) cream 1 application  1 application Topical PRN Lynnda Child, PA-C      . magnesium oxide (MAG-OX) tablet 400 mg  400 mg Oral Daily Shirley Friar, PA-C   400 mg at 02/26/18 0930  . multivitamin (RENA-VIT)  tablet 1 tablet  1 tablet Oral QHS Shirley Friar, PA-C   1 tablet at 02/25/18 2242  . ondansetron (ZOFRAN) injection 4 mg  4 mg Intravenous Q6H PRN Shirley Friar, PA-C      . pantoprazole (PROTONIX) EC tablet 40 mg  40 mg Oral Daily Shirley Friar, PA-C   40 mg at 02/26/18 0930  . pentafluoroprop-tetrafluoroeth (GEBAUERS) aerosol 1 application  1 application Topical PRN Ejigiri, Thomos Lemons, PA-C      . polyethylene glycol (MIRALAX / GLYCOLAX) packet 17 g  17 g Oral Once Shirley Friar, PA-C      . sevelamer carbonate (RENVELA) tablet 3,200 mg  3,200 mg Oral TID WC Lynnda Child, PA-C   3,200 mg at 02/26/18 0754  . sildenafil (REVATIO) tablet 40 mg  40 mg Oral TID Shirley Friar, PA-C   40 mg at 02/26/18 0930  . traMADol (ULTRAM) tablet 50-100 mg  50-100 mg Oral Q12H PRN Shirley Friar, PA-C   100 mg at 02/25/18 2242  . traZODone (DESYREL) tablet 100 mg  100 mg Oral QHS PRN Shirley Friar, PA-C   100 mg at 02/25/18 2242  . Warfarin - Pharmacist Dosing Inpatient   Does not apply q1800 Bensimhon, Shaune Pascal, MD        Allergies as of 02/23/2018  . (No Known Allergies)     Review of Systems:     Constitutional: No weight loss, fever or chills Skin: No rash  Cardiovascular: No chest pain  Respiratory: No SOB Gastrointestinal: See HPI and otherwise negative Genitourinary: No dysuria Neurological: No headache, dizziness or syncope Musculoskeletal: No new muscle or joint pain Hematologic: No bleeding  Psychiatric: No history of depression or anxiety   Physical Exam:  Vital signs in last 24 hours: Temp:  [97.4 F (36.3 C)-97.7 F (36.5 C)] 97.7 F (36.5 C) (04/25 0730) Pulse Rate:  [40-96] 77 (04/25 0730) Resp:  [9-29] 18 (04/25 0730) BP: (73-101)/(46-88) 91/79 (04/25 0730) SpO2:  [93 %-100 %] 93 % (04/25 0730) Weight:  [183 lb 10.3 oz (83.3 kg)-188 lb 0.8 oz (85.3 kg)] 183 lb 10.3 oz (83.3 kg) (04/25 0730) Last BM  Date: 02/25/18 General:   Pleasant Caucasian male appears to be in NAD, Well developed, Well nourished, alert and cooperative Head:  Normocephalic and atraumatic. Eyes:   PEERL, EOMI. No icterus. Conjunctiva pink. Ears:  Normal auditory acuity. Neck:  Supple, +JVD Throat: Oral cavity and pharynx without inflammation, swelling or lesion. Teeth in good condition. Lungs: Respirations even and unlabored. Lungs clear to auscultation bilaterally.   No wheezes, crackles, or rhonchi.  Heart: LVAD hum, no cyanosis or pallor.  Abdomen:  Soft, nondistended, nontender. No rebound or guarding. Normal bowel sounds. No appreciable masses or hepatomegaly. Rectal:  Not performed.  Msk:  Symmetrical without gross deformities. Peripheral pulses intact.  Extremities:  1+ edema, no deformity or joint abnormality.  Neurologic:  Alert and  oriented x4;  grossly normal neurologically.  Skin:   Dry and intact without significant lesions or rashes. Psychiatric: Demonstrates good judgement and reason without abnormal affect or behaviors.  LAB RESULTS: Recent Labs    02/24/18 0303 02/24/18 0722 02/25/18 0607 02/26/18 0204  WBC 10.3  --  9.3 10.1  HGB 8.1* 8.9* 9.1* 8.4*  HCT 24.2* 26.5* 27.3* 25.2*  PLT 163  --  146* 151   BMET Recent Labs    02/24/18 0303 02/25/18 0607 02/26/18 0204  NA 130* 133* 130*  K 3.7 3.5 3.8  CL 92* 94* 93*  CO2 28 27 25   GLUCOSE 286* 69 175*  BUN 25* 38* 48*  CREATININE 3.17* 4.16* 4.76*  CALCIUM 8.3* 8.6* 8.2*   LFT Recent Labs    02/26/18 0204  ALBUMIN 3.4*   PT/INR Recent Labs    02/25/18 0607 02/26/18 0204  LABPROT 24.0* 23.0*  INR 2.17 2.05     Impression / Plan:   Impression: 1.  Symptomatic anemia: Hemoglobin initially 7.7--> 2 units PRBCs--> 8.9 -> 9.1 -> 8.4 with dark stools , last colonoscopy 4/18 with multiple AVM's APC and clipping 2.  Acute bronchitis/URI: improving on doxycycline 3.  Acute on chronic systolic CHF: Ischemic cardiomyopathy,  EF 20-25% with RV dysfunction on 2/18 echo pre-LVAD, status post HeartMate 2 LVAD 2/18, Medtronic ICD, RV failure postop, developed worsening renal dysfunction and started hemodialysis on 8/18, much more short of breath than usual, despite volume status looking stable 4.  ESRD 5.  A. fib: On Coumadin, INR 2.05 today  Plan: 1. Scheduled EGD+ Colonoscopy with Dr. Hilarie Fredrickson tomorrow. Did review risks, benefits, limitations and alternatives and the patient agrees to proceed. 2. Continue to monitor hemoglobin with transfusion as needed. 3. Patient to remain on clears for the remainder of the day and NPO after midnight. 4. Start Moviprep today 5. Please await any further recommendations from Dr. Hilarie Fredrickson later today.  Thank you for your kind consultation, we will continue to follow.  Lavone Nian [facility]  02/26/2018, 11:57 AM Pager #: [phone]

## 2018-02-26 NOTE — Progress Notes (Signed)
Assessment/Plan: 1. Symptomatic anemia. Prob GI Bleed.  2. ESRD -  MWFSat. Orders written for routine HD today. No heparin, added K bath.  3. HTN/Volume  - BP stable No volume excess on exam. CXR clear - UF goal 2L. May need EDW adjustment.(EDW 83kg) Will do HD in AM and reeval for need for 4th/wk. 4. ICM/CHF EF 20-25% s/p LVAD/ICD - per cards. 5. CAD s/p CABG -  6. Afib - on Coumadin, INR at goal  - per cards   Subjective: Interval History: Black stools.  C/O post HD increased neuropathic pain in hands and feet.  PD was suggested by Cardiology  Objective: Vital signs in last 24 hours: Temp:  [97.4 F (36.3 C)-98.3 F (36.8 C)] 98.3 F (36.8 C) (04/25 1236) Pulse Rate:  [40-96] 77 (04/25 0730) Resp:  [9-29] 18 (04/25 0730) BP: (73-107)/(46-88) 107/71 (04/25 1236) SpO2:  [93 %-100 %] 93 % (04/25 0730) Weight:  [83.3 kg (183 lb 10.3 oz)-85.3 kg (188 lb 0.8 oz)] 83.3 kg (183 lb 10.3 oz) (04/25 0730) Weight change: 3.653 kg (8 lb 0.8 oz)  Intake/Output from previous day: 04/24 0701 - 04/25 0700 In: 720 [P.O.:720] Out: 2450 [Urine:450] Intake/Output this shift: Total I/O In: 240 [P.O.:240] Out: -   General appearance: alert and cooperative Cardio: LVAD Extremities: edema Tr edema in feet  Lab Results: Recent Labs    02/25/18 0607 02/26/18 0204  WBC 9.3 10.1  HGB 9.1* 8.4*  HCT 27.3* 25.2*  PLT 146* 151   BMET:  Recent Labs    02/25/18 0607 02/26/18 0204  NA 133* 130*  K 3.5 3.8  CL 94* 93*  CO2 27 25  GLUCOSE 69 175*  BUN 38* 48*  CREATININE 4.16* 4.76*  CALCIUM 8.6* 8.2*   No results for input(s): PTH in the last 72 hours. Iron Studies: No results for input(s): IRON, TIBC, TRANSFERRIN, FERRITIN in the last 72 hours. Studies/Results: No results found.  Scheduled: . atorvastatin  40 mg Oral q1800  . budesonide  0.25 mg Inhalation BID  . busPIRone  5 mg Oral BID  . calcitRIOL  0.5 mcg Oral Q M,W,F-HD  . citalopram  20 mg Oral Daily  . docusate sodium   100 mg Oral Daily  . doxycycline  100 mg Oral Q12H  . gabapentin  300 mg Oral BID  . insulin aspart  0-5 Units Subcutaneous QHS  . insulin aspart  0-9 Units Subcutaneous TID WC  . insulin glargine  15 Units Subcutaneous QHS  . levothyroxine  25 mcg Oral QAC breakfast  . magnesium oxide  400 mg Oral Daily  . multivitamin  1 tablet Oral QHS  . pantoprazole  40 mg Oral Daily  . polyethylene glycol  17 g Oral Once  . sevelamer carbonate  3,200 mg Oral TID WC  . sildenafil  40 mg Oral TID  . Warfarin - Pharmacist Dosing Inpatient   Does not apply q1800    LOS: 3 days   Trevor [name] 02/26/2018,12:39 PM

## 2018-02-26 NOTE — Progress Notes (Signed)
HD tx initiated via 15G buttonhole x2 w/o problem, pull/push/flush w/o problem, VSS on LVAD, will cont to monitor while on HD tx

## 2018-02-26 NOTE — Progress Notes (Signed)
LVAD Coordinator Rounding Note:  Admitted 02/23/18 per [name] due to symptomatic anemia.   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 Darcey Nora under DT criteria due to age excluding heart transplant.   Pt sleeping this am. HD completed yesterday.   Vital signs: Temp: 97.7 HR: 67 Doppler Pressure: not done Automatic BP:  97/79 (84)   O2 Sat: 97% on RA Wt: 180>180>185>188>183 lbs  LVAD interrogation reveals:  Speed: 9600 Flow: 5.9 Power:  6.4 PI: 3.1 Alarms: none Events:  15 PI events Fixed speed: 9600 Low speed limit: 9000  Drive Line:  Right abdominal sorbaview dressing dry and intact with 2 large tegaderms covering. Anchor intact and accurately applied.   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 Margins not clear at neck.   Plan/Recommendations:   1. Weekly VAD drive line dressing changes using weekly kit and no bio patch. Rinse area with sterile saline after cleansing with Chlora prep. Bedside RN may change dressing. Next dressing change due 03/03/18. 2. Please call VAD pager if any VAD equipment or drive line issues.  [name] RN, VAD Coordinator 24/7 VAD pager: [phone]

## 2018-02-26 NOTE — Consult Note (Signed)
[facility] Surgery Consult Note  Trevor [name] [birth year]  [id number].    Requesting MD: Bensimhon Chief Complaint/Reason for Consult: PD catheter consideration  HPI:  Patient is a 72 year old male with PMH significant for history of LVAD placement in 2018. Who has been on HD since 06/2017. Initially was tolerating dialysis well but in the last month or so has not been tolerating. Feels exhausted after and reports paresthesias, neuropathy, and abdominal cramping after dialysis. Feels like he is too dehydrated afterwards. Currently denies abdominal pain, n/v. Does report melena and GI is planning upper and lower endoscopy for tomorrow. Past abdominal surgeries include exploratory laparotomy for internal hernia when he was 80. He also has a small umbilical hernia that he has never had any issues with. He wants to consider PD catheter placement as an alternative means of dialysis.   ROS: Review of Systems  Constitutional: Positive for malaise/fatigue. Negative for chills and fever.  Respiratory: Positive for shortness of breath.   Cardiovascular: Negative for chest pain and palpitations.  Gastrointestinal: Positive for melena. Negative for abdominal pain, blood in stool, nausea and vomiting.  Neurological: Positive for tingling and sensory change.  All other systems reviewed and are negative.   Family History  Problem Relation Age of Onset  . Heart failure Father   . Heart attack Father   . Healthy Mother   . Diabetes Paternal Grandfather     Past Medical History:  Diagnosis Date  . AICD (automatic cardioverter/defibrillator) present   . Asthma   . AVM (arteriovenous malformation) of colon 07/07/2017  . CHF (congestive heart failure) ([place])   . Diabetes ([place])   . ESRF (end stage renal failure) ([place]) 07/07/2017   pt receiving hemodialysis Monday- Wednesday-friday, sometimes Saturday  . Kidney disease   . LVAD (left ventricular assist device) present ([place]) 12/2016  . Permanent  atrial fibrillation ([place]) 07/07/2017  . Presence of permanent cardiac pacemaker    AICD  . Sleep apnea     Past Surgical History:  Procedure Laterality Date  . AORTIC VALVE REPLACEMENT  2015   [place]  . APPLICATION OF A-CELL OF EXTREMITY Left 11/20/2017   Procedure: APPLICATION OF A-CELL;  Surgeon: Wallace Going, DO;  Location: [place];  Service: Plastics;  Laterality: Left;  . AV FISTULA PLACEMENT Left 06/30/2017   Procedure: CREATION of LEFT ARM Brachiocephalic Fistula;  Surgeon: Conrad Bolivar, MD;  Location: [place];  Service: Vascular;  Laterality: Left;  . CARDIAC CATHETERIZATION N/A 12/02/2016   Procedure: Right Heart Cath;  Surgeon: Larey Dresser, MD;  Location: [place] [facility];  Service: Cardiovascular;  Laterality: N/A;  . COLONOSCOPY N/A 02/14/2017   Procedure: COLONOSCOPY;  Surgeon: Milus Banister, MD;  Location: [place];  Service: Endoscopy;  Laterality: N/A;  . CORONARY ANGIOPLASTY WITH STENT PLACEMENT  2013   in [place]  . CORONARY ARTERY BYPASS GRAFT  2010   in [place]  . DIALYSIS/PERMA CATHETER INSERTION  06/30/2017   Procedure: INSERTION Dialysis Catheter;  Surgeon: Conrad [place], MD;  Location: [place];  Service: Vascular;;  . IABP INSERTION N/A 12/11/2016   Procedure: IABP Insertion;  Surgeon: Larey Dresser, MD;  Location: [place] [facility];  Service: Cardiovascular;  Laterality: N/A;  . INSERTION OF IMPLANTABLE LEFT VENTRICULAR ASSIST DEVICE N/A 12/13/2016   Procedure: INSERTION OF IMPLANTABLE LEFT VENTRICULAR ASSIST DEVICE;  Surgeon: Ivin Poot, MD;  Location: [place];  Service: Open Heart Surgery;  Laterality: N/A;  HEARTMATE II  NITRIC OXIDE  . IR FLUORO GUIDE CV LINE LEFT  06/17/2017  . IR US GUIDE VASC ACCESS LEFT  06/17/2017  . MASS EXCISION Left 11/20/2017   Procedure: EXCISION OF LEFT NECK AND RIGHT LEG SKIN CANCER WITH A CELL PLACEMENT;  Surgeon: Wallace Going, DO;  Location: [place];  Service: Plastics;  Laterality: Left;  Marland Kitchen MASS EXCISION Left  11/26/2017   Procedure: EXPLORATION AND EXCISION OF LEFT NECK;  Surgeon: Wallace Going, DO;  Location: [place];  Service: Plastics;  Laterality: Left;  . RIGHT HEART CATH N/A 12/11/2016   Procedure: Right Heart Cath;  Surgeon: Larey Dresser, MD;  Location: [facility] [facility];  Service: Cardiovascular;  Laterality: N/A;  . RIGHT HEART CATH N/A 03/27/2017   Procedure: Right Heart Cath;  Surgeon: Larey Dresser, MD;  Location: [place] [facility];  Service: Cardiovascular;  Laterality: N/A;  . RIGHT HEART CATH N/A 06/13/2017   Procedure: RIGHT HEART CATH;  Surgeon: Jolaine Artist, MD;  Location: [place] [facility];  Service: Cardiovascular;  Laterality: N/A;  . TEE WITHOUT CARDIOVERSION N/A 12/13/2016   Procedure: TRANSESOPHAGEAL ECHOCARDIOGRAM (TEE);  Surgeon: Ivin Poot, MD;  Location: [place];  Service: Open Heart Surgery;  Laterality: N/A;  . TRICUSPID VALVE REPLACEMENT N/A 12/13/2016   Procedure: TRICUSPID VALVE REPAIR WITH EDWARDS MC 3 TRICUSPID ANNULOPLASTY RING MODEL 4900 SIZE T 28;  Surgeon: Ivin Poot, MD;  Location: [place];  Service: Open Heart Surgery;  Laterality: N/A;    Social History:  reports that he has never smoked. He has never used smokeless tobacco. He reports that he does not drink alcohol or use drugs.  Allergies: No Known Allergies  Facility-Administered Medications Prior to Admission  Medication Dose Route Frequency Provider Last Rate Last Dose  . octreotide (SANDOSTATIN LAR) IM injection 20 mg  20 mg Intramuscular Once Larey Dresser, MD       Medications Prior to Admission  Medication Sig Dispense Refill  . albuterol (PROVENTIL HFA;VENTOLIN HFA) 108 (90 Base) MCG/ACT inhaler Inhale 2 puffs into the lungs every 4 (four) hours as needed for wheezing or shortness of breath. 3 Inhaler 5  . albuterol (PROVENTIL) (2.5 MG/3ML) 0.083% nebulizer solution Take 3 mLs (2.5 mg total) by nebulization every 4 (four) hours. And as needed (Patient taking differently:  Take 2.5 mg by nebulization every 4 (four) hours as needed for wheezing or shortness of breath. ) 150 mL 5  . atorvastatin (LIPITOR) 40 MG tablet TAKE 1 TABLET(40 MG) BY MOUTH DAILY (Patient taking differently: Take 40 mg by mouth daily at 6 PM. ) 30 tablet 11  . busPIRone (BUSPAR) 5 MG tablet Take 1 tablet (5 mg total) by mouth 2 (two) times daily. 180 tablet 3  . calcitRIOL (ROCALTROL) 0.5 MCG capsule Take 1 capsule (0.5 mcg total) by mouth every other day. (Patient taking differently: Take 0.5 mcg by mouth every Monday, Wednesday, and Friday with hemodialysis. ) 15 capsule 6  . docusate sodium (COLACE) 100 MG capsule Take 1 capsule (100 mg total) by mouth 2 (two) times daily. (Patient taking differently: Take 100 mg daily by mouth. ) 60 capsule 12  . ferric citrate (AURYXIA) 1 GM 210 MG(Fe) tablet Take 420 mg by mouth 3 (three) times daily with meals.    . fluticasone (FLOVENT HFA) 110 MCG/ACT inhaler Inhale 2 puffs into the lungs 2 (two) times daily. 3 Inhaler 5  . gabapentin (NEURONTIN) 600 MG tablet Take 0.5 tablets (300  mg total) by mouth 2 (two) times daily.    . insulin glargine (LANTUS) 100 unit/mL SOPN Inject 0.25 mLs (25 Units total) into the skin at bedtime. 1 pen 1  . levothyroxine (SYNTHROID, LEVOTHROID) 25 MCG tablet Take 1 tablet (25 mcg total) by mouth daily before breakfast. 90 tablet 3  . magnesium oxide (MAG-OX) 400 MG tablet Take 400 mg by mouth daily.    . multivitamin (RENA-VIT) TABS tablet Take 1 tablet by mouth at bedtime. 30 tablet 6  . pantoprazole (PROTONIX) 40 MG tablet Take 1 tablet (40 mg total) by mouth daily. 30 tablet 5  . sevelamer carbonate (RENVELA) 800 MG tablet Take 1,600-2,400 mg by mouth See admin instructions. Take 2,400 mg by mouth three times a day with meals and 1,600 mg two times a day with snacks  3  . sildenafil (REVATIO) 20 MG tablet Take 2 tablets (40 mg total) 3 (three) times daily by mouth. (Patient taking differently: Take 40 mg by mouth See admin  instructions. Take 40 mg by mouth three times a day- midday, evening, and bedtime) 180 tablet 6  . traMADol (ULTRAM) 50 MG tablet Take 1 tablet (50 mg total) by mouth every 6 (six) hours as needed. TAKE 1 TABLET BY MOUTH EVERY 6 HOURS AS NEEDED FOR MODERATE PAIN (Patient taking differently: Take 50 mg by mouth every 6 (six) hours as needed for moderate pain. ) 60 tablet 3  . traZODone (DESYREL) 100 MG tablet TAKE 1 TABLET BY MOUTH EVERY NIGHT AT BEDTIME AS NEEDED FOR SLEEP (Patient taking differently: Take 100 mg by mouth at bedtime) 30 tablet 0  . warfarin (COUMADIN) 5 MG tablet Take 7.5-10 mg by mouth See admin instructions. Take 7.5 mg by mouth in the evening with dinner on Sun/Mon/Wed/Fri/Sat and 10 mg on Tues/Thurs      Blood pressure 91/79, pulse 77, temperature 97.7 F (36.5 C), temperature source Oral, resp. rate 18, height 5' 5"  (1.651 m), weight 83.3 kg (183 lb 10.3 oz), SpO2 93 %. Physical Exam: Physical Exam  Constitutional: He is oriented to person, place, and time. He appears well-developed. He is cooperative. No distress.  HENT:  Head: Normocephalic and atraumatic.  Right Ear: External ear normal.  Left Ear: External ear normal.  Nose: Nose normal.  Mouth/Throat: Oropharynx is clear and moist and mucous membranes are normal.  Eyes: Pupils are equal, round, and reactive to light. Conjunctivae and lids are normal. No scleral icterus.  Neck: Normal range of motion and phonation normal. Neck supple.  Cardiovascular:  LVAD in place and mechanical hum on auscultation   Pulmonary/Chest: Effort normal. He has rales (mild bibasilar).  Abdominal: Soft. Bowel sounds are normal. He exhibits distension (mild). There is no tenderness. There is no rigidity, no rebound and no guarding. A hernia (small reducible umbilical) is present.  VAD in L abdomen, midline scar  Musculoskeletal:  ROM grossly intact in bilateral upper and lower extremities  Neurological: He is alert and oriented to  person, place, and time.  Skin: Skin is warm, dry and intact.  Psychiatric: He has a normal mood and affect. His speech is normal and behavior is normal.    Results for orders placed or performed during the hospital encounter of 02/23/18 (from the past 48 hour(s))  MRSA PCR Screening     Status: None   Collection Time: 02/24/18  1:14 PM  Result Value Ref Range   MRSA by PCR NEGATIVE NEGATIVE    Comment:  The GeneXpert MRSA Assay (FDA approved for NASAL specimens only), is one component of a comprehensive MRSA colonization surveillance program. It is not intended to diagnose MRSA infection nor to guide or monitor treatment for MRSA infections. Performed at [facility], [place] [address], [place], [place] [address]   Glucose, capillary     Status: Abnormal   Collection Time: 02/24/18  1:56 PM  Result Value Ref Range   Glucose-Capillary 216 (H) 65 - 99 mg/dL  Glucose, capillary     Status: Abnormal   Collection Time: 02/24/18  4:21 PM  Result Value Ref Range   Glucose-Capillary 111 (H) 65 - 99 mg/dL  Glucose, capillary     Status: Abnormal   Collection Time: 02/24/18  9:35 PM  Result Value Ref Range   Glucose-Capillary 179 (H) 65 - 99 mg/dL   Comment 1 Notify RN    Comment 2 Document in Chart   Lactate dehydrogenase     Status: Abnormal   Collection Time: 02/25/18  6:07 AM  Result Value Ref Range   LDH 224 (H) 98 - 192 U/L    Comment: Performed at [facility], [place] [address], [place], [place] [address]  Protime-INR     Status: Abnormal   Collection Time: 02/25/18  6:07 AM  Result Value Ref Range   Prothrombin Time 24.0 (H) 11.4 - 15.2 seconds   INR 2.17     Comment: Performed at [facility] [address] [place], [place] [address]  CBC     Status: Abnormal   Collection Time: 02/25/18  6:07 AM  Result Value Ref Range   WBC 9.3 4.0 - 10.5 K/uL   RBC 2.95 (L) 4.22 - 5.81 MIL/uL   Hemoglobin 9.1 (L) 13.0 - 17.0 g/dL   HCT 27.3 (L) 39.0 - 52.0 %    MCV 92.5 78.0 - 100.0 fL   MCH 30.8 26.0 - 34.0 pg   MCHC 33.3 30.0 - 36.0 g/dL   RDW 19.9 (H) 11.5 - 15.5 %   Platelets 146 (L) 150 - 400 K/uL    Comment: Performed at [facility], [place] [address] [place], [place] [address]  Basic metabolic panel     Status: Abnormal   Collection Time: 02/25/18  6:07 AM  Result Value Ref Range   Sodium 133 (L) 135 - 145 mmol/L   Potassium 3.5 3.5 - 5.1 mmol/L   Chloride 94 (L) 101 - 111 mmol/L   CO2 27 22 - 32 mmol/L   Glucose, Bld 69 65 - 99 mg/dL   BUN 38 (H) 6 - 20 mg/dL   Creatinine, Ser 4.16 (H) 0.61 - 1.24 mg/dL   Calcium 8.6 (L) 8.9 - 10.3 mg/dL   GFR calc non Af Amer 13 (L) >60 mL/min   GFR calc Af Amer 15 (L) >60 mL/min    Comment: (NOTE) The eGFR has been calculated using the CKD EPI equation. This calculation has not been validated in all clinical situations. eGFR's persistently <60 mL/min signify possible Chronic Kidney Disease.    Anion gap 12 5 - 15    Comment: Performed at [facility] [address], [place], [place] [address]  Glucose, capillary     Status: None   Collection Time: 02/25/18  8:27 AM  Result Value Ref Range   Glucose-Capillary 65 65 - 99 mg/dL   Comment 1 Notify RN    Comment 2 Document in Chart   Glucose, capillary  Status: Abnormal   Collection Time: 02/25/18 12:56 PM  Result Value Ref Range   Glucose-Capillary 174 (H) 65 - 99 mg/dL   Comment 1 Notify RN    Comment 2 Document in Chart   Glucose, capillary     Status: Abnormal   Collection Time: 02/25/18  4:06 PM  Result Value Ref Range   Glucose-Capillary 150 (H) 65 - 99 mg/dL   Comment 1 Notify RN    Comment 2 Document in Chart   Glucose, capillary     Status: Abnormal   Collection Time: 02/25/18  9:09 PM  Result Value Ref Range   Glucose-Capillary 222 (H) 65 - 99 mg/dL  Lactate dehydrogenase     Status: Abnormal   Collection Time: 02/26/18  2:04 AM  Result Value Ref Range   LDH 235 (H) 98 - 192 U/L    Comment: Performed at  [facility], [place] [address], [place], [place] [address]  Protime-INR     Status: Abnormal   Collection Time: 02/26/18  2:04 AM  Result Value Ref Range   Prothrombin Time 23.0 (H) 11.4 - 15.2 seconds   INR 2.05     Comment: Performed at [facility], [place] [address] [place], [place] [address]  CBC     Status: Abnormal   Collection Time: 02/26/18  2:04 AM  Result Value Ref Range   WBC 10.1 4.0 - 10.5 K/uL   RBC 2.73 (L) 4.22 - 5.81 MIL/uL   Hemoglobin 8.4 (L) 13.0 - 17.0 g/dL   HCT 25.2 (L) 39.0 - 52.0 %   MCV 92.3 78.0 - 100.0 fL   MCH 30.8 26.0 - 34.0 pg   MCHC 33.3 30.0 - 36.0 g/dL   RDW 19.7 (H) 11.5 - 15.5 %   Platelets 151 150 - 400 K/uL    Comment: Performed at [facility], [place] [address], [place], [place] [address]  Basic metabolic panel     Status: Abnormal   Collection Time: 02/26/18  2:04 AM  Result Value Ref Range   Sodium 130 (L) 135 - 145 mmol/L   Potassium 3.8 3.5 - 5.1 mmol/L   Chloride 93 (L) 101 - 111 mmol/L   CO2 25 22 - 32 mmol/L   Glucose, Bld 175 (H) 65 - 99 mg/dL   BUN 48 (H) 6 - 20 mg/dL   Creatinine, Ser 4.76 (H) 0.61 - 1.24 mg/dL   Calcium 8.2 (L) 8.9 - 10.3 mg/dL   GFR calc non Af Amer 11 (L) >60 mL/min   GFR calc Af Amer 13 (L) >60 mL/min    Comment: (NOTE) The eGFR has been calculated using the CKD EPI equation. This calculation has not been validated in all clinical situations. eGFR's persistently <60 mL/min signify possible Chronic Kidney Disease.    Anion gap 12 5 - 15    Comment: Performed at [facility] [address] [place], [place] [address]  Albumin     Status: Abnormal   Collection Time: 02/26/18  2:04 AM  Result Value Ref Range   Albumin 3.4 (L) 3.5 - 5.0 g/dL    Comment: Performed at [facility], [place] [address] [place], [place] [address]  Phosphorus     Status: None   Collection Time: 02/26/18  2:04 AM  Result Value Ref Range   Phosphorus 4.3 2.5 - 4.6 mg/dL    Comment: Performed at [facility] [address], [place], [place]  [address]  Glucose, capillary     Status: None   Collection Time: 02/26/18  8:00 AM  Result Value Ref Range   Glucose-Capillary 81 65 - 99 mg/dL   Comment 1 Notify RN    Comment 2 Document in Chart    No results found.    Assessment/Plan CAD Ischemic cardiomyopathy - s/p LVAD ESRD on HD Chronic A.Fib Asthma DM2  Request for peritoneal dialysis catheter insertion - MD to see and decide whether patient is a candidate with surgical history and MMP - would not be tomorrow either way with endoscopy planned   Brigid Re, [facility] Surgery 02/26/2018, 11:55 AM Pager: [phone] Consults: [phone] Mon-Fri 7:00 am-4:30 pm Sat-Sun 7:00 am-11:30 am

## 2018-02-27 ENCOUNTER — Inpatient Hospital Stay (HOSPITAL_COMMUNITY): Payer: Medicare Other | Admitting: Certified Registered Nurse Anesthetist

## 2018-02-27 ENCOUNTER — Encounter (HOSPITAL_COMMUNITY): Payer: Self-pay | Admitting: Internal Medicine

## 2018-02-27 ENCOUNTER — Encounter (HOSPITAL_COMMUNITY)
Admission: EM | Disposition: A | Discharge status: Home / Self Care | Payer: Self-pay | Source: Home / Self Care | Attending: Internal Medicine

## 2018-02-27 DIAGNOSIS — D126 Benign neoplasm of colon, unspecified: Secondary | ICD-10-CM

## 2018-02-27 DIAGNOSIS — K5730 Diverticulosis of large intestine without perforation or abscess without bleeding: Secondary | ICD-10-CM

## 2018-02-27 DIAGNOSIS — Q2733 Arteriovenous malformation of digestive system vessel: Secondary | ICD-10-CM

## 2018-02-27 HISTORY — PX: GIVENS CAPSULE STUDY: SHX5432

## 2018-02-27 HISTORY — PX: COLONOSCOPY WITH PROPOFOL: SHX5780

## 2018-02-27 HISTORY — PX: ESOPHAGOGASTRODUODENOSCOPY (EGD) WITH PROPOFOL: SHX5813

## 2018-02-27 LAB — BASIC METABOLIC PANEL
Anion gap: 9 (ref 5–15)
BUN: 20 mg/dL (ref 6–20)
CHLORIDE: 95 mmol/L — AB (ref 101–111)
CO2: 29 mmol/L (ref 22–32)
Calcium: 7.9 mg/dL — ABNORMAL LOW (ref 8.9–10.3)
Creatinine, Ser: 2.99 mg/dL — ABNORMAL HIGH (ref 0.61–1.24)
GFR calc Af Amer: 23 mL/min — ABNORMAL LOW (ref >60)
GFR calc non Af Amer: 19 mL/min — ABNORMAL LOW (ref >60)
GLUCOSE: 129 mg/dL — AB (ref 65–99)
POTASSIUM: 3.9 mmol/L (ref 3.5–5.1)
Sodium: 133 mmol/L — ABNORMAL LOW (ref 135–145)

## 2018-02-27 LAB — GLUCOSE, CAPILLARY
GLUCOSE-CAPILLARY: 62 mg/dL — AB (ref 65–99)
GLUCOSE-CAPILLARY: 108 mg/dL — AB (ref 65–99)
Glucose-Capillary: 122 mg/dL — ABNORMAL HIGH (ref 65–99)
Glucose-Capillary: 98 mg/dL (ref 65–99)
Glucose-Capillary: 162 mg/dL — ABNORMAL HIGH (ref 65–99)

## 2018-02-27 LAB — CBC
HCT: 24.9 % — ABNORMAL LOW (ref 39.0–52.0)
HEMOGLOBIN: 8.2 g/dL — AB (ref 13.0–17.0)
MCH: 31.2 pg (ref 26.0–34.0)
MCHC: 32.9 g/dL (ref 30.0–36.0)
MCV: 94.7 fL (ref 78.0–100.0)
Platelets: 144 10*3/uL — ABNORMAL LOW (ref 150–400)
RBC: 2.63 MIL/uL — AB (ref 4.22–5.81)
RDW: 19.9 % — ABNORMAL HIGH (ref 11.5–15.5)
WBC: 11 10*3/uL — ABNORMAL HIGH (ref 4.0–10.5)

## 2018-02-27 LAB — PROTIME-INR
INR: 2.31
Prothrombin Time: 25.2 seconds — ABNORMAL HIGH (ref 11.4–15.2)

## 2018-02-27 LAB — LACTATE DEHYDROGENASE: LDH: 217 U/L — AB (ref 98–192)

## 2018-02-27 SURGERY — ESOPHAGOGASTRODUODENOSCOPY (EGD) WITH PROPOFOL
Anesthesia: Monitor Anesthesia Care

## 2018-02-27 SURGERY — IMAGING PROCEDURE, GI TRACT, INTRALUMINAL, VIA CAPSULE
Anesthesia: LOCAL

## 2018-02-27 MED ORDER — HYDRALAZINE HCL 20 MG/ML IJ SOLN
10.0000 mg | Freq: Once | INTRAMUSCULAR | Status: AC
  Administered 2018-02-27: 10 mg via INTRAVENOUS
  Filled 2018-02-27: qty 1

## 2018-02-27 MED ORDER — LIDOCAINE 2% (20 MG/ML) 5 ML SYRINGE
INTRAMUSCULAR | Status: DC | PRN
  Administered 2018-02-27: 100 mg via INTRAVENOUS

## 2018-02-27 MED ORDER — SODIUM CHLORIDE 0.9 % IV SOLN
INTRAVENOUS | Status: DC
  Administered 2018-02-27: 10:00:00 via INTRAVENOUS

## 2018-02-27 MED ORDER — PHENYLEPHRINE 40 MCG/ML (10ML) SYRINGE FOR IV PUSH (FOR BLOOD PRESSURE SUPPORT)
PREFILLED_SYRINGE | INTRAVENOUS | Status: DC | PRN
  Administered 2018-02-27: 40 ug via INTRAVENOUS
  Administered 2018-02-27 (×3): 80 ug via INTRAVENOUS

## 2018-02-27 MED ORDER — FENTANYL CITRATE (PF) 250 MCG/5ML IJ SOLN
INTRAMUSCULAR | Status: DC | PRN
  Administered 2018-02-27: 25 ug via INTRAVENOUS

## 2018-02-27 MED ORDER — ETOMIDATE 2 MG/ML IV SOLN
INTRAVENOUS | Status: DC | PRN
  Administered 2018-02-27: 10 mg via INTRAVENOUS
  Administered 2018-02-27: 2 mg via INTRAVENOUS
  Administered 2018-02-27: 4 mg via INTRAVENOUS
  Administered 2018-02-27: 8 mg via INTRAVENOUS
  Administered 2018-02-27: 4 mg via INTRAVENOUS

## 2018-02-27 MED ORDER — MIDAZOLAM HCL 2 MG/2ML IJ SOLN
INTRAMUSCULAR | Status: DC | PRN
  Administered 2018-02-27 (×2): 1 mg via INTRAVENOUS

## 2018-02-27 SURGICAL SUPPLY — 25 items

## 2018-02-27 NOTE — Anesthesia Preprocedure Evaluation (Addendum)
Anesthesia Evaluation  Patient identified by MRN, date of birth, ID band Patient awake    Reviewed: Allergy & Precautions, NPO status , Patient's Chart, lab work & pertinent test results  Airway Mallampati: II  TM Distance: >3 FB Neck ROM: Full    Dental no notable dental hx.    Pulmonary asthma , sleep apnea ,    Pulmonary exam normal breath sounds clear to auscultation       Cardiovascular +CHF  Normal cardiovascular exam+ pacemaker + Cardiac Defibrillator  Rhythm:Regular Rate:Normal  ECG: A-Fib, RBBB, rate 73  LVAD (left ventricular assist device) present   ECHO:  Limited RAMP study for LVAD; severe global reduction in LV systolic function; LVAD at LV apex; moderate LVE; mildly dilated aortic root; mild MR; severe LAE; severe RVE with severely reduced function; severe RAE; s/p TV repair with mild TR.    Neuro/Psych negative neurological ROS  negative psych ROS   GI/Hepatic negative GI ROS, Neg liver ROS,   Endo/Other  diabetes  Renal/GU ESRF and DialysisRenal disease     Musculoskeletal negative musculoskeletal ROS (+)   Abdominal (+) + obese,   Peds  Hematology  (+) anemia ,   Anesthesia Other Findings Anemia Melena  Reproductive/Obstetrics                            Anesthesia Physical Anesthesia Plan  ASA: IV  Anesthesia Plan: MAC   Post-op Pain Management:    Induction: Intravenous  PONV Risk Score and Plan: 1 and Propofol infusion and Treatment may vary due to age or medical condition  Airway Management Planned: Natural Airway  Additional Equipment:   Intra-op Plan:   Post-operative Plan:   Informed Consent: I have reviewed the patients History and Physical, chart, labs and discussed the procedure including the risks, benefits and alternatives for the proposed anesthesia with the patient or authorized representative who has indicated his/her understanding and  acceptance.   Dental advisory given  Plan Discussed with: CRNA  Anesthesia Plan Comments:         Anesthesia Quick Evaluation

## 2018-02-27 NOTE — Progress Notes (Signed)
Hypoglycemic Event  CBG: 62  Treatment: Sprite  Symptoms: none  Follow-up CBG: Time:1835 CBG Result:108  Possible Reasons for Event: pt has been NPO for procedure  Comments/MD notified:    Kyra Laffey, Theodosia Paling

## 2018-02-27 NOTE — Progress Notes (Signed)
VAD Coordinator Procedure Note:   Patient underwent EGD and colonoscopy per Dr. Hilarie Fredrickson in endo lab. VAD Coordinator accompanied and remained with patient during procedure.  Hemodynamics and VAD parameters monitored by anesthesia and myself  throughout the procedure. MAPs were obtained with automatic cudd cuff on right arm and correlated with doppler.     Doppler Auto cuff(MAP):   Flow:  PI: Power:     Speed:                Pre-procedure: 10:00  98  94/80 (83)  6.5  3.5 6.7     9600       Sedation Induction: 10:35    70/58 (64)  5.8  3.6 6.3 10:45    87/70 (76)  5.7  3.8 6.3 11:00    94/76 (79)  5.8  4.1 6.5 11:15    100/88 (97)  5.6  4.5 6.3 11:30    109/91 (95)  6.9  5.9 5.9     Recovery area: 12:00    109/91 (95)  5.5  6.8 6.o   Patient tolerated the procedure well.   Patient Disposition: 2C03

## 2018-02-27 NOTE — Progress Notes (Signed)
Assessment/Plan: 1. Symptomatic anemia. GI bleed nondiagnostic endo/col 2. ESRD -MWFSat. Orders written for Sat. No heparin, added K bath.  3. HTN/Volume- BP stable. CXR clear - UF goal 2L. May need EDW adjustment.(EDW 83kg) Will do HD in AM  4. ICM/CHF EF 20-25% s/p LVAD/ICD - per cards. 5. CAD s/p CABG -  Afib - on Coumadin per Card  Subjective: Interval History: Admits to fluid control issues.  Objective: Vital signs in last 24 hours: Temp:  [97.3 F (36.3 C)-98.6 F (37 C)] 98.3 F (36.8 C) (04/26 1956) Pulse Rate:  [40-83] 73 (04/26 1956) Resp:  [12-22] 15 (04/26 1956) BP: (76-115)/(58-93) 108/93 (04/26 1956) SpO2:  [99 %-100 %] 99 % (04/26 1600) Weight:  [188 lb 11.2 oz (85.6 kg)] 188 lb 11.2 oz (85.6 kg) (04/26 0955) Weight change: -4 lb 6.6 oz (-2 kg)  Intake/Output from previous day: 04/25 0701 - 04/26 0700 In: 240 [P.O.:240] Out: -  Intake/Output this shift: No intake/output data recorded.  General appearance: alert and cooperative Chest wall: no tenderness, no presacral edema Extremities: edema chronic chnges edema 1-2+ on r, 1+ on L  Lab Results: Recent Labs    02/26/18 0204 02/27/18 0250  WBC 10.1 11.0*  HGB 8.4* 8.2*  HCT 25.2* 24.9*  PLT 151 144*   BMET:  Recent Labs    02/26/18 0204 02/27/18 0250  NA 130* 133*  K 3.8 3.9  CL 93* 95*  CO2 25 29  GLUCOSE 175* 129*  BUN 48* 20  CREATININE 4.76* 2.99*  CALCIUM 8.2* 7.9*   No results for input(s): PTH in the last 72 hours. Iron Studies: No results for input(s): IRON, TIBC, TRANSFERRIN, FERRITIN in the last 72 hours. Studies/Results: No results found.  Scheduled: . atorvastatin  40 mg Oral q1800  . budesonide  0.25 mg Inhalation BID  . busPIRone  5 mg Oral BID  . calcitRIOL  0.5 mcg Oral Q M,W,F-HD  . citalopram  20 mg Oral Daily  . docusate sodium  100 mg Oral Daily  . doxycycline  100 mg Oral Q12H  . gabapentin  300 mg Oral BID  . insulin aspart  0-5 Units Subcutaneous QHS  .  insulin aspart  0-9 Units Subcutaneous TID WC  . insulin glargine  15 Units Subcutaneous QHS  . levothyroxine  25 mcg Oral QAC breakfast  . magnesium oxide  400 mg Oral Daily  . multivitamin  1 tablet Oral QHS  . pantoprazole  40 mg Oral Daily  . polyethylene glycol  17 g Oral Once  . sevelamer carbonate  3,200 mg Oral TID WC  . sildenafil  40 mg Oral TID  . Warfarin - Pharmacist Dosing Inpatient   Does not apply q1800     LOS: 4 days   Estanislado Emms 02/27/2018,8:30 PM

## 2018-02-27 NOTE — Progress Notes (Signed)
0340 Pt Doppler pressure was 108. The pt was resting comfortably in bed no s/s of distress. On call MD made aware and gave orders. Orders followed. Will continue to monitor pt.

## 2018-02-27 NOTE — Anesthesia Postprocedure Evaluation (Signed)
Anesthesia Post Note  Patient: Trevor [name]  Procedure(s) Performed: ESOPHAGOGASTRODUODENOSCOPY (EGD) WITH PROPOFOL (N/A ) COLONOSCOPY WITH PROPOFOL (N/A ) GIVENS CAPSULE STUDY (N/A )     Patient location during evaluation: PACU Anesthesia Type: MAC Level of consciousness: awake and alert Pain management: pain level controlled Vital Signs Assessment: post-procedure vital signs reviewed and stable Respiratory status: spontaneous breathing, nonlabored ventilation, respiratory function stable and patient connected to nasal cannula oxygen Cardiovascular status: stable and blood pressure returned to baseline Postop Assessment: no apparent nausea or vomiting Anesthetic complications: no    Last Vitals:  Vitals:   02/27/18 1145 02/27/18 1400  BP: (!) 109/91 103/87  Pulse: 68 79  Resp: 17 15  Temp:  36.7 C  SpO2: 100% 99%    Last Pain:  Vitals:   02/27/18 1400  TempSrc: Oral  PainSc:                  Ryan P Ellender

## 2018-02-27 NOTE — Progress Notes (Addendum)
Advanced Heart Failure VAD Team Note  PCP-Cardiologist: No primary care provider on file.   Subjective:    Hgb 7.7 -> s/p 2 UPRBCs -> 8.9 -> 9.1 -> 8.4 with dark stools -> 8.2.  Underwent Colon/EGD this am with normal EGD and Colonoscopy with Red blood and coffee-ground hematin was found in the entire colon (cecum to rectum). No source found for active bleeding despite copious irrigation and lavage and careful inspection. Planning capsule endoscopy.   Feels fatigued after procedure, but otherwise OK. No CP or SOB.  No orthopnea or PND. Weight stable  after colon prep.   LVAD Interrogation HM 2: Speed: 9600 Flow: 6.0 PI: 3.2 Power: 6.0. 15 PI events  Objective:    Vital Signs:   Temp:  [97.3 F (36.3 C)-98.6 F (37 C)] 97.5 F (36.4 C) (04/26 1135) Pulse Rate:  [65-83] 68 (04/26 1145) Resp:  [12-22] 17 (04/26 1145) BP: (76-115)/(58-91) 109/91 (04/26 1145) SpO2:  [100 %] 100 % (04/26 1145) Weight:  [188 lb 11.2 oz (85.6 kg)] 188 lb 11.2 oz (85.6 kg) (04/26 0955) Last BM Date: 02/27/18   Mean arterial Pressure 70-80s  Intake/Output:   Intake/Output Summary (Last 24 hours) at 02/27/2018 1453 Last data filed at 02/27/2018 1130 Gross per 24 hour  Intake 400 ml  Output 0 ml  Net 400 ml     Physical Exam    GENERAL: Well appearing this am. NAD.  HEENT: Normal. anicteric NECK: Supple, JVP 6-7 cm. Carotids OK.  CARDIAC:  Mechanical heart sounds with LVAD hum present.  LUNGS:  CTAB, normal effort.  No wheeze  ABDOMEN:  NT, ND, no HSM. No bruits or masses. +BS  LVAD exit site: Well-healed and incorporated. Dressing dry and intact. No erythema or drainage. Stabilization device present and accurately applied. Driveline dressing changed daily per sterile technique. EXTREMITIES:  Warm and dry. No cyanosis, clubbing, rash, or edema.  Neuro: alert & oriented x 3, cranial nerves grossly intact. moves all 4 extremities w/o difficulty. Affect pleasant     Telemetry   Afib 60-70s,  personally reviewed.   EKG    No new tracings.    Labs   Basic Metabolic Panel: Recent Labs  Lab 02/23/18 1659 02/24/18 0303 02/25/18 0607 02/26/18 0204 02/27/18 0250  NA 130* 130* 133* 130* 133*  K 3.6 3.7 3.5 3.8 3.9  CL 91* 92* 94* 93* 95*  CO2 27 28 27 25 29   GLUCOSE 295* 286* 69 175* 129*  BUN 15 25* 38* 48* 20  CREATININE 2.33* 3.17* 4.16* 4.76* 2.99*  CALCIUM 8.4* 8.3* 8.6* 8.2* 7.9*  PHOS  --   --   --  4.3  --     Liver Function Tests: Recent Labs  Lab 02/26/18 0204  ALBUMIN 3.4*   No results for input(s): LIPASE, AMYLASE in the last 168 hours. No results for input(s): AMMONIA in the last 168 hours.  CBC: Recent Labs  Lab 02/23/18 1659 02/24/18 0303 02/24/18 0722 02/25/18 0607 02/26/18 0204 02/27/18 0250  WBC 13.5* 10.3  --  9.3 10.1 11.0*  HGB 7.7* 8.1* 8.9* 9.1* 8.4* 8.2*  HCT 23.5* 24.2* 26.5* 27.3* 25.2* 24.9*  MCV 96.3 94.5  --  92.5 92.3 94.7  PLT 202 163  --  146* 151 144*    INR: Recent Labs  Lab 02/23/18 1659 02/24/18 0303 02/25/18 0607 02/26/18 0204 02/27/18 0250  INR 2.93 2.61 2.17 2.05 2.31    Other results:  EKG:    Imaging  No results found.   Medications:     Scheduled Medications: . atorvastatin  40 mg Oral q1800  . budesonide  0.25 mg Inhalation BID  . busPIRone  5 mg Oral BID  . calcitRIOL  0.5 mcg Oral Q M,W,F-HD  . citalopram  20 mg Oral Daily  . docusate sodium  100 mg Oral Daily  . doxycycline  100 mg Oral Q12H  . gabapentin  300 mg Oral BID  . insulin aspart  0-5 Units Subcutaneous QHS  . insulin aspart  0-9 Units Subcutaneous TID WC  . insulin glargine  15 Units Subcutaneous QHS  . levothyroxine  25 mcg Oral QAC breakfast  . magnesium oxide  400 mg Oral Daily  . multivitamin  1 tablet Oral QHS  . pantoprazole  40 mg Oral Daily  . polyethylene glycol  17 g Oral Once  . sevelamer carbonate  3,200 mg Oral TID WC  . sildenafil  40 mg Oral TID  . Warfarin - Pharmacist Dosing Inpatient   Does  not apply q1800    Infusions: . sodium chloride    . sodium chloride      PRN Medications: sodium chloride, sodium chloride, acetaminophen, albuterol, heparin, lidocaine (PF), lidocaine-prilocaine, ondansetron (ZOFRAN) IV, pentafluoroprop-tetrafluoroeth, traMADol, traZODone   Patient Profile   Trevor [name] is a 72 y.o. male with a history of CAD s/p CABG x 4 2010, OSA, AS with TAVR 2015, , ESRD on HD, DM2, chronic atrial fibrillation, asthma, and ischemic cardiomyopathy with Medtronic ICD.  S/p HMII LVAD for Destination therapy on 12/14/2015.   Presented to [facility] 02/23/18 with worsening SOB and malaise s/p HD.   Assessment/Plan:    1. 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  - Underwent Colon/EGD 02/27/18 with normal EGD and Colonoscopy with Red blood and coffee-ground hematin was found in the entire colon (cecum to rectum). No source found for active bleeding despite copious irrigation and lavage and careful inspection. - Capsule Endoscopy administered 02/27/18.  - Continue octreotide injections.  - Hgb 7.7 -> 8.9 s/p 2 uPRBCs -> 9.1 -> 8.4 Now with Dark stools. -> 8.2 2. Acute bronchitis/URI - Covering with course of doxy with improvement. - Will complete 7 day course.  3. Acute on chronic systolic CHF: Ischemic cardiomyopathy, EF 20-25% with RV dysfunction on 2/18 echo pre-LVAD. 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 Medtronic ICD. He has had problems with RV failure post-op. He developed worsening renal dysfunction and finally had to start HD in 8/18. There has been some difficulty getting adequate fluid off him, currently dialyzing 4 days/week.  - Breathing much improved. Tolerated HD overnight OK. But still feels worn out with general malaise and paraesthesias afterward.  - Continue warfarin with INR goal 2-2.5, no ASA.INR trending down. Discussed dose with Pharm-D personally.  - Will discuss with Renal optimal volume status and timing to improve QOL.  Would try and get back to 3 times a week HD if possible.   We working with Renal and Surgery to see if he is a PD candidate. 4. CAD: s/p CABG.  - No s/s of ischemia.    - He is on atorvastatin.  5. ESRD:  - HD planned for tomorrow with multiple procedures today. Fistula has matured and being used.  - With overall poor tolerance, may be able to consider PD. Discussing with surgery and renal.  6. Atrial fibrillation: Chronic.  - Rate controlled.  - Coumadin on hold with bleeding. Discussed dosing with Pharm-D  personally.  7. RV failure:  - Continue revatio 40 mg TID.  - No change to current plan.   8. HTN:  - MAP has been stable. Slightly lower today with sedation.  9. 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  - Holding off on radiation. He does not think he can handle radiation as well as HD 4 days/week.  - No change to current plan.   10. H/o TAVR:  - Stable on recent Echo.   Capsule Endo underway. Will need to stay through the weekend.   I reviewed the LVAD parameters from today, and compared the results to the patient's prior recorded data.  No programming changes were made.  The LVAD is functioning within specified parameters.  The patient performs LVAD self-test daily.  LVAD interrogation was negative for any significant power changes, alarms or PI events/speed drops.  LVAD equipment check completed and is in good working order.  Back-up equipment present.   LVAD education done on emergency procedures and precautions and reviewed exit site care.   Length of Stay: 4  Annamaria Helling 02/27/2018, 2:53 PM  VAD Team --- VAD ISSUES ONLY--- Pager [phone] (7am - 7am)  Advanced Heart Failure Team  Pager [phone] (M-F; 7a - 4p)  Please contact [facility] for night-coverage after hours (4p -7a ) and weekends on [email or web address]   Patient seen and examined with the above-signed  Advanced Practice Provider and/or Housestaff. I personally reviewed laboratory data, imaging studies and relevant notes. I independently examined the patient and formulated the important aspects of the plan. I have edited the note to reflect any of my changes or salient points. I have personally discussed the plan with the patient and/or family.  Results of EGD discussed with him seems to have active bleeding in distal SB. Unable to reach on endo. Now with capsule in place. Await results. Hgb continues to drift down slowly. Can transfuse again as needed. Volume status ok after colon prep. Will have HD tomorrow per Renal.   Discussed placement of PD catheter with GSU. Would be complicated due to previous laparotomy. Will discuss further with their team. VAD interrogated personally. Parameters stable. INR 2.31. Will hold warfarin today.   Glori Bickers, MD  5:30 PM

## 2018-02-27 NOTE — Progress Notes (Signed)
2100 Pt c/o of " not feeling well" . 2107 Pt CBG 54. Pt was given 4oz Apple juice and pt's CBG was rechecked at 2122 and came up to 71. Pt stated he is starting to feel much better and was given another apple juice to drink pt CBG was rechecked an hr later and came up to 139. Pt expressed concern about taking his Lantus 15 Units tonight because of his NPO status after MN and his trend of being low the last couple mornings. The on call MD was made of the pt concerns and stated it was ok to hold Lantus till AM. Will continue to monitor pt.

## 2018-02-27 NOTE — Progress Notes (Signed)
ANTICOAGULATION CONSULT NOTE  Pharmacy Consult for warfarin Indication: LVAD  No Known Allergies  Patient Measurements: Height: 5\' 5"  (165.1 cm) Weight: 188 lb 11.2 oz (85.6 kg) IBW/kg (Calculated) : 61.5   Vital Signs: Temp: 97.5 F (36.4 C) (04/26 1135) Temp Source: Oral (04/26 1135) BP: 109/91 (04/26 1145) Pulse Rate: 68 (04/26 1145)  Labs: Recent Labs    02/25/18 0607 02/26/18 0204 02/27/18 0250  HGB 9.1* 8.4* 8.2*  HCT 27.3* 25.2* 24.9*  PLT 146* 151 144*  LABPROT 24.0* 23.0* 25.2*  INR 2.17 2.05 2.31  CREATININE 4.16* 4.76* 2.99*    Estimated Creatinine Clearance: 22.5 mL/min (A) (by C-G formula based on SCr of 2.99 mg/dL (H)).   Medical History: Past Medical History:  Diagnosis Date  . AICD (automatic cardioverter/defibrillator) present   . Asthma   . AVM (arteriovenous malformation) of colon 07/07/2017  . CHF (congestive heart failure) ([place])   . Diabetes ([place])   . ESRF (end stage renal failure) ([place]) 07/07/2017   pt receiving hemodialysis Monday- Wednesday-friday, sometimes Saturday  . Kidney disease   . LVAD (left ventricular assist device) present ([place]) 12/2016  . Permanent atrial fibrillation ([place]) 07/07/2017  . Presence of permanent cardiac pacemaker    AICD  . Sleep apnea      Assessment: 71yom with HF Hx LVAD HM2 presented to ED with SOB, feels bad.   INR on admit was supratherapeutic on 2.93 - dose was held 4/22. INR 2.3 will again hold warfarin for possible GI procedure s/p EGD and colonoscopy 4/26 and capsule study.   Received two units of PRBCs on 4/22. Hgb trending down 8.4, plts 151, LDH stable at 224. Has hx of ESRD.   PTA warfarin dose 10mg  Tu/TH and 7.5mg  all other days   Has hx of colonic AVMs on colonoscopy on 4/18 admission.   Goal of Therapy:  INR 2-2.5 Monitor platelets by anticoagulation protocol: Yes   Plan:  Hold warfarin therapy for now Heparin if INR falls < 1.8 Daily INR and monitor for signs/symptoms of  bleeding  [name] Pharm.D. CPP, BCPS Clinical Pharmacist [phone] 02/27/2018 2:58 PM

## 2018-02-27 NOTE — Op Note (Signed)
[facility] Patient Name: Trevor [name] Procedure Date : 02/27/2018 MRN: [id number] Attending MD: Jerene Bears , MD Date of Birth: Feb 25, 1946 CSN: [id number] Age: 72 Admit Type: Inpatient Procedure:                Colonoscopy Indications:              Heme positive stool, history of arteriovenous                            malformation in the large intestine, acute anemia,                            patient with LVAD Providers:                Lajuan Lines. Hilarie Fredrickson, MD, Baird Cancer, RN, Vista Lawman,                            RN, Charolette Child, Technician Referring MD:             Shaune Pascal. Bensimhom, MD Medicines:                Monitored Anesthesia Care Complications:            No immediate complications. Estimated Blood Loss:     Estimated blood loss: none. Procedure:                Pre-Anesthesia Assessment:                           - Prior to the procedure, a History and Physical                            was performed, and patient medications and                            allergies were reviewed. [name] tolerance of                            previous anesthesia was also reviewed. The risks                            and benefits of the procedure and the sedation                            options and risks were discussed with the patient.                            All questions were answered, and informed consent                            was obtained. Prior Anticoagulants: The patient has                            taken Coumadin (warfarin), last dose was 2 days  prior to procedure. ASA Grade Assessment: III - A                            patient with severe systemic disease. After                            reviewing the risks and benefits, the patient was                            deemed in satisfactory condition to undergo the                            procedure.                           After obtaining informed consent,  the colonoscope                            was passed under direct vision. Throughout the                            procedure, the patient's blood pressure, pulse, and                            oxygen saturations were monitored continuously. The                            EC-[id number] ([id number]) scope was introduced through                            the anus and advanced to the terminal ileum. The                            colonoscopy was performed without difficulty. The                            patient tolerated the procedure well. The quality                            of the bowel preparation was fair given bleeding,                            but cleared to adequate with copious irrigation and                            lavage. The terminal ileum, ileocecal valve,                            appendiceal orifice, and rectum were photographed. Scope In: 10:50:07 AM Scope Out: 11:27:15 AM Scope Withdrawal Time: 0 hours 30 minutes 13 seconds  Total Procedure Duration: 0 hours 37 minutes 8 seconds  Findings:      The digital rectal exam was normal.      The terminal ileum contained red blood, suggestive of a more  proximal       source of bleeding.      Red blood and coffee-ground hematin was found in the entire colon (cecum       to rectum). No source found for active bleeding despite copious       irrigation and lavage and careful inspection.      Multiple small and large-mouthed diverticula were found in the sigmoid       colon and distal descending colon. There was no evidence of diverticular       bleeding.      Multiple sessile polyps were found from the ascending colon to the       sigmoid colon. The polyps were small and medium in size. Polypectomy was       not attempted given ongoing bleeding and anticoagulation.      The retroflexed view of the distal rectum and anal verge was normal and       showed no anal or rectal abnormalities. Impression:               - Blood in the  terminal ileum suggestive of a small                            bowel bleeding source.                           - Blood in the entire examined colon without source                            of active bleeding.                           - Moderate diverticulosis in the sigmoid colon and                            in the distal descending colon. There was no                            evidence of diverticular bleeding.                           - Multiple small polyps from ascending colon to                            sigmoid colon. Resection not attempted.                           - No specimens collected. Moderate Sedation:      N/A Recommendation:           - Return patient to hospital ward for ongoing care.                           - To visualize the small bowel, perform video                            capsule endoscopy today.                           -  Hold anticoagulation.                           - Closely monitor Hgb, transfuse if needed per CHF                            team. Procedure Code(s):        --- Professional ---                           [phone], Colonoscopy, flexible; diagnostic, including                            collection of specimen(s) by brushing or washing,                            when performed (separate procedure) Diagnosis Code(s):        --- Professional ---                           K92.2, Gastrointestinal hemorrhage, unspecified                           D12.6, Benign neoplasm of colon, unspecified                           R19.5, Other fecal abnormalities                           Q27.33, Arteriovenous malformation of digestive                            system vessel                           K57.30, Diverticulosis of large intestine without                            perforation or abscess without bleeding CPT copyright 2017 American Medical Association. All rights reserved. The codes documented in this report are preliminary and upon coder  review may  be revised to meet current compliance requirements. Jerene Bears, MD 02/27/2018 12:00:56 PM This report has been signed electronically. Number of Addenda: 0

## 2018-02-27 NOTE — Op Note (Signed)
[facility] Patient Name: Trevor [name] Procedure Date : 02/27/2018 MRN: [id number] Attending MD: Jerene Bears , MD Date of Birth: 08/06/1946 CSN: [id number] Age: 72 Admit Type: Inpatient Procedure:                Upper GI endoscopy Indications:              Anemia secondary to chronic blood loss, Heme                            positive stool, Exclusion of angioectasia of the                            stomach, Exclusion of angioectasia of duodenum in                            patient with LVAD Providers:                Lajuan Lines. Hilarie Fredrickson, MD, Baird Cancer, RN, Vista Lawman,                            RN, Charolette Child, Technician Referring MD:             Shaune Pascal. Bensimhom, MD Medicines:                Monitored Anesthesia Care Complications:            No immediate complications. Estimated Blood Loss:     Estimated blood loss: none. Procedure:                Pre-Anesthesia Assessment:                           - Prior to the procedure, a History and Physical                            was performed, and patient medications and                            allergies were reviewed. [name] tolerance of                            previous anesthesia was also reviewed. The risks                            and benefits of the procedure and the sedation                            options and risks were discussed with the patient.                            All questions were answered, and informed consent                            was obtained. Prior Anticoagulants: The patient has  taken Coumadin (warfarin), last dose was 2 days                            prior to procedure. ASA Grade Assessment: III - A                            patient with severe systemic disease. After                            reviewing the risks and benefits, the patient was                            deemed in satisfactory condition to undergo the          procedure.                           After obtaining informed consent, the endoscope was                            passed under direct vision. Throughout the                            procedure, the patient's blood pressure, pulse, and                            oxygen saturations were monitored continuously. The                            [id number] ([id number]) scope was introduced through the                            mouth, and advanced to the third part of duodenum.                            The upper GI endoscopy was accomplished without                            difficulty. The patient tolerated the procedure                            well. Scope In: Scope Out: Findings:      The esophagus was normal. Z-line is regular at 40 cm from the incisors.      The stomach was normal.      The examined duodenum was normal. Impression:               - Normal esophagus.                           - Normal stomach.                           - Normal examined duodenum.                           -  No evidence of GI bleeding or bleeding lesions in                            the examined UGI tract.                           - No specimens collected. Moderate Sedation:      N/A Recommendation:           - Return patient to hospital ward for ongoing care.                           - See the other procedure note for documentation of                            additional recommendations. Procedure Code(s):        --- Professional ---                           [id number], Esophagogastroduodenoscopy, flexible,                            transoral; diagnostic, including collection of                            specimen(s) by brushing or washing, when performed                            (separate procedure) Diagnosis Code(s):        --- Professional ---                           D50.0, Iron deficiency anemia secondary to blood                            loss (chronic)                            R19.5, Other fecal abnormalities CPT copyright 2017 American Medical Association. All rights reserved. The codes documented in this report are preliminary and upon coder review may  be revised to meet current compliance requirements. Jerene Bears, MD 02/27/2018 10:44:54 AM This report has been signed electronically. Number of Addenda: 0

## 2018-02-27 NOTE — Progress Notes (Signed)
LVAD Coordinator Rounding Note:  Admitted 02/23/18 per [name] due to symptomatic anemia.   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 Darcey Nora under DT criteria due to age excluding heart transplant.   Pt reports he had "rough night" with bowel prep, low blood sugars, and high BP.   He reports his stool this am has bright red blood with flecks of brown stool. INR 2.31, Hgb 8.2 - Dr. Hilarie Fredrickson discussed INR and procedure with [name]. Will proceed with colonoscopy and EGD.   Vital signs: Temp: 98.6 HR: 70 Doppler Pressure: 82 is correlating with modified systolic BP Automatic BP:  86/69 (76) O2 Sat: 100% on RA Wt: 180>180>185>188>183>188>188 lbs  LVAD interrogation reveals:  Speed: 9600 Flow: 6.8 Power:  7.0 PI: 3.4 Alarms: none Events:  3 PI events Fixed speed: 9600 Low speed limit: 9000  Drive Line:  Right abdominal sorbaview dressing dry and intact with 2 large tegaderms covering. Anchor intact and accurately applied.   Labs:  LDH trend: 257>232>224>235>217  INR trend: 2.93> 2.61>2.17>2.05>2.31  Hgb trend: 7.7>8.1>8.9>9.1>8.4>8.2  Anticoagulation Plan: -INR Goal:  2.0 - 2.5 -ASA Dose: no ASA due to hx of GI bleed  Blood Products:  -02/23/18>2 units PCs   Device: - Medtronic single lead -Therapies: on at 231 bpm  Adverse Events on VAD: 02/2017> GIB- AVM clipped x2 07/2017> renal failure- HD started 11/2017>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 Margins not clear at neck.   Plan/Recommendations:   1. Weekly VAD drive line dressing changes using weekly kit and no bio patch. Rinse area with sterile saline after cleansing with Chlora prep. Bedside RN may change dressing. Next dressing change due 03/03/18. 2. VAD coordinator will accompany patient to endo for colonoscopy and EGD today. 2. Please call VAD pager if any VAD equipment or drive line issues.  [name] RN, VAD Coordinator 24/7 VAD pager: [phone]

## 2018-02-27 NOTE — Transfer of Care (Signed)
Immediate Anesthesia Transfer of Care Note  Patient: Trevor [name]  Procedure(s) Performed: ESOPHAGOGASTRODUODENOSCOPY (EGD) WITH PROPOFOL (N/A ) COLONOSCOPY WITH PROPOFOL (N/A )  Patient Location: Endoscopy Unit  Anesthesia Type:MAC  Level of Consciousness: awake, alert  and oriented  Airway & Oxygen Therapy: Patient Spontanous Breathing and Patient connected to nasal cannula oxygen  Post-op Assessment: Report given to RN and Post -op Vital signs reviewed and stable  Post vital signs: Reviewed and stable  Last Vitals:  Vitals Value Taken Time  BP    Temp    Pulse    Resp    SpO2      Last Pain:  Vitals:   02/27/18 0955  TempSrc: Oral  PainSc: 0-No pain      Patients Stated Pain Goal: 0 (66/29/47 6546)  Complications: No apparent anesthesia complications

## 2018-02-27 NOTE — Interval H&P Note (Signed)
History and Physical Interval Note: For EGD and colonoscopy today to evaluate anemia and heme positive stools in the setting of LVAD INR is elevated at 2.3. I have discussed this with [name]. We will proceed with MAC HIGHER THAN BASELINE RISK.The nature of the procedure, as well as the risks, benefits, and alternatives were carefully and thoroughly reviewed with the patient. Ample time for discussion and questions allowed. The patient understood, was satisfied, and agreed to proceed.    02/27/2018 9:52 AM  Trevor [name]  has presented today for surgery, with the diagnosis of Anemia, Melena  The various methods of treatment have been discussed with the patient and family. After consideration of risks, benefits and other options for treatment, the patient has consented to  Procedure(s): ESOPHAGOGASTRODUODENOSCOPY (EGD) WITH PROPOFOL (N/A) COLONOSCOPY WITH PROPOFOL (N/A) as a surgical intervention .  [name] history has been reviewed, patient examined, no change in status, stable for surgery.  I have reviewed the patient's chart and labs.  Questions were answered to the patient's satisfaction.     [name]

## 2018-02-28 ENCOUNTER — Inpatient Hospital Stay (HOSPITAL_COMMUNITY): Payer: Medicare Other

## 2018-02-28 ENCOUNTER — Encounter (HOSPITAL_COMMUNITY)
Admission: EM | Disposition: A | Discharge status: Home / Self Care | Payer: Self-pay | Source: Home / Self Care | Attending: Internal Medicine

## 2018-02-28 DIAGNOSIS — K922 Gastrointestinal hemorrhage, unspecified: Secondary | ICD-10-CM

## 2018-02-28 DIAGNOSIS — K5520 Angiodysplasia of colon without hemorrhage: Secondary | ICD-10-CM

## 2018-02-28 LAB — GLUCOSE, CAPILLARY
GLUCOSE-CAPILLARY: 160 mg/dL — AB (ref 65–99)
GLUCOSE-CAPILLARY: 264 mg/dL — AB (ref 65–99)
Glucose-Capillary: 214 mg/dL — ABNORMAL HIGH (ref 65–99)
Glucose-Capillary: 193 mg/dL — ABNORMAL HIGH (ref 65–99)
Glucose-Capillary: 94 mg/dL (ref 65–99)
Glucose-Capillary: 116 mg/dL — ABNORMAL HIGH (ref 65–99)

## 2018-02-28 LAB — HEMOGLOBIN AND HEMATOCRIT, BLOOD
HCT: 29.7 % — ABNORMAL LOW (ref 39.0–52.0)
Hemoglobin: 10 g/dL — ABNORMAL LOW (ref 13.0–17.0)

## 2018-02-28 LAB — CBC
HEMATOCRIT: 23.3 % — AB (ref 39.0–52.0)
Hemoglobin: 7.6 g/dL — ABNORMAL LOW (ref 13.0–17.0)
MCH: 30.8 pg (ref 26.0–34.0)
MCHC: 32.6 g/dL (ref 30.0–36.0)
MCV: 94.3 fL (ref 78.0–100.0)
PLATELETS: 122 10*3/uL — AB (ref 150–400)
RBC: 2.47 MIL/uL — ABNORMAL LOW (ref 4.22–5.81)
RDW: 19.8 % — AB (ref 11.5–15.5)
WBC: 8.1 10*3/uL (ref 4.0–10.5)

## 2018-02-28 LAB — RENAL FUNCTION PANEL
ALBUMIN: 3.1 g/dL — AB (ref 3.5–5.0)
ANION GAP: 9 (ref 5–15)
BUN: 21 mg/dL — AB (ref 6–20)
CO2: 29 mmol/L (ref 22–32)
Calcium: 8.4 mg/dL — ABNORMAL LOW (ref 8.9–10.3)
Chloride: 93 mmol/L — ABNORMAL LOW (ref 101–111)
Creatinine, Ser: 3.79 mg/dL — ABNORMAL HIGH (ref 0.61–1.24)
GFR calc Af Amer: 17 mL/min — ABNORMAL LOW (ref >60)
GFR, EST NON AFRICAN AMERICAN: 15 mL/min — AB (ref >60)
Glucose, Bld: 235 mg/dL — ABNORMAL HIGH (ref 65–99)
PHOSPHORUS: 3 mg/dL (ref 2.5–4.6)
Potassium: 4.1 mmol/L (ref 3.5–5.1)
Sodium: 131 mmol/L — ABNORMAL LOW (ref 135–145)

## 2018-02-28 LAB — PROTIME-INR
INR: 2.46
INR: 1.82
PROTHROMBIN TIME: 26.5 s — AB (ref 11.4–15.2)
Prothrombin Time: 20.9 seconds — ABNORMAL HIGH (ref 11.4–15.2)

## 2018-02-28 LAB — PREPARE RBC (CROSSMATCH)

## 2018-02-28 LAB — LACTATE DEHYDROGENASE: LDH: 211 U/L — ABNORMAL HIGH (ref 98–192)

## 2018-02-28 SURGERY — IMAGING PROCEDURE, GI TRACT, INTRALUMINAL, VIA CAPSULE
Anesthesia: LOCAL

## 2018-02-28 MED ORDER — HEPARIN SODIUM (PORCINE) 1000 UNIT/ML DIALYSIS: 1000.0000 [IU] | INTRAMUSCULAR | Status: DC | PRN

## 2018-02-28 MED ORDER — LIDOCAINE HCL (PF) 1 % IJ SOLN: 5.0000 mL | INTRAMUSCULAR | Status: DC | PRN

## 2018-02-28 MED ORDER — PENTAFLUOROPROP-TETRAFLUOROETH EX AERO: 1.0000 "application " | INHALATION_SPRAY | CUTANEOUS | Status: DC | PRN

## 2018-02-28 MED ORDER — SODIUM CHLORIDE 0.9 % IV SOLN: Freq: Once | INTRAVENOUS | Status: DC

## 2018-02-28 MED ORDER — SODIUM CHLORIDE 0.9 % IV SOLN: 100.0000 mL | INTRAVENOUS | Status: DC | PRN

## 2018-02-28 MED ORDER — ALTEPLASE 2 MG IJ SOLR: 2.0000 mg | Freq: Once | INTRAMUSCULAR | Status: DC | PRN

## 2018-02-28 MED ORDER — LIDOCAINE-PRILOCAINE 2.5-2.5 % EX CREA: 1.0000 "application " | TOPICAL_CREAM | CUTANEOUS | Status: DC | PRN

## 2018-02-28 MED ORDER — OCTREOTIDE ACETATE 50 MCG/ML IJ SOLN
50.0000 ug | Freq: Two times a day (BID) | INTRAMUSCULAR | Status: DC
  Administered 2018-02-28 – 2018-03-01 (×2): 50 ug via SUBCUTANEOUS
  Filled 2018-02-28 (×3): qty 1

## 2018-02-28 NOTE — Progress Notes (Signed)
Visited with patient this morning. He has had his endoscopies and suspected to have a small bowel bleeding source, planning to get a capsule endoscopy.  I have significant reservations about placing a PD catheter in this patient not only because of his medical problems, from a technical standpoint. With history of a large previous midline incision, I think that PD cath would be unsuccessful, if placed would likely fail quickly and require multiple procedures for malfunction.  I discussed this with some of my partners who also placed PD catheters, and have gotten a similar response from them. I relayed this to the patient. Will sign off at this time.

## 2018-02-28 NOTE — Progress Notes (Addendum)
Advanced Heart Failure VAD Team Note  PCP-Cardiologist: No primary care provider on file.   Subjective:    Events: - 4/25 2u RBCs - 4/26 Colon/EGD - Colonoscopy with red blood and coffee-ground hematin was found in the entire colon (cecum to rectum). No source found for active bleeding despite copious irrigation and lavage and careful inspection.  - 4/26 capsule endo - Capsule endoscopy showed active bleeding in the ileum; source not found.  Given clinical history and LVAD status this is likely related to active bleeding ileal angioectasias/angiodysplasias. - 4/27 2u RBCs  GI recommending holding coumadin for as long as possible to let bleeding stop. Restarting SQ octreotide. Still with episodes of BRBPR.  Had another 2 units of RBCs yesterday in HD.  Hgb 7.6->9.2  After HD got very SOB required breathing treatments. CXR ok. He is better this am    INR 1.75  LVAD Interrogation HM 2: Speed: 9600 Flow: 5.9 PI: 3.2 Power: 7.0.   Objective:    Vital Signs:   Temp:  [97.4 F (36.3 C)-98.3 F (36.8 C)] 97.5 F (36.4 C) (04/27 0845) Pulse Rate:  [40-81] 71 (04/27 1000) Resp:  [13-22] 13 (04/27 0945) BP: (80-127)/(54-93) 99/80 (04/27 0945) SpO2:  [97 %-100 %] 98 % (04/27 0845) Weight:  [87.2 kg (192 lb 3.9 oz)-87.2 kg (192 lb 4.8 oz)] 87.2 kg (192 lb 3.9 oz) (04/27 0845) Last BM Date: 02/27/18   Mean arterial Pressure 80s  Intake/Output:   Intake/Output Summary (Last 24 hours) at 02/28/2018 1019 Last data filed at 02/28/2018 0900 Gross per 24 hour  Intake 1462 ml  Output 400 ml  Net 1062 ml     Physical Exam    General:  NAD.  HEENT: normal  Neck: supple. JVP 7-8  Carotids 2+ bilat; no bruits. No lymphadenopathy or thryomegaly appreciated. Cor: LVAD hum.  Lungs: Clear. Abdomen: obese soft, nontender, non-distended. No hepatosplenomegaly. No bruits or masses. Good bowel sounds. Driveline site clean. Anchor in place.  Extremities: no cyanosis, clubbing, rash. Warm no  edema  Neuro: alert & oriented x 3. No focal deficits. Moves all 4 without problem    Telemetry   Afib 70s, personally reviewed.   EKG    No new tracings.    Labs   Basic Metabolic Panel: Recent Labs  Lab 02/24/18 0303 02/25/18 0607 02/26/18 0204 02/27/18 0250 02/28/18 0218  NA 130* 133* 130* 133* 131*  K 3.7 3.5 3.8 3.9 4.1  CL 92* 94* 93* 95* 93*  CO2 28 27 25 29 29   GLUCOSE 286* 69 175* 129* 235*  BUN 25* 38* 48* 20 21*  CREATININE 3.17* 4.16* 4.76* 2.99* 3.79*  CALCIUM 8.3* 8.6* 8.2* 7.9* 8.4*  PHOS  --   --  4.3  --  3.0    Liver Function Tests: Recent Labs  Lab 02/26/18 0204 02/28/18 0218  ALBUMIN 3.4* 3.1*   No results for input(s): LIPASE, AMYLASE in the last 168 hours. No results for input(s): AMMONIA in the last 168 hours.  CBC: Recent Labs  Lab 02/24/18 0303 02/24/18 0722 02/25/18 0607 02/26/18 0204 02/27/18 0250 02/28/18 0218  WBC 10.3  --  9.3 10.1 11.0* 8.1  HGB 8.1* 8.9* 9.1* 8.4* 8.2* 7.6*  HCT 24.2* 26.5* 27.3* 25.2* 24.9* 23.3*  MCV 94.5  --  92.5 92.3 94.7 94.3  PLT 163  --  146* 151 144* 122*    INR: Recent Labs  Lab 02/24/18 0303 02/25/18 0607 02/26/18 0204 02/27/18 0250 02/28/18 0218  INR  2.61 2.17 2.05 2.31 2.46    Other results:    Imaging   No results found.   Medications:     Scheduled Medications: . atorvastatin  40 mg Oral q1800  . budesonide  0.25 mg Inhalation BID  . busPIRone  5 mg Oral BID  . calcitRIOL  0.5 mcg Oral Q M,W,F-HD  . citalopram  20 mg Oral Daily  . docusate sodium  100 mg Oral Daily  . doxycycline  100 mg Oral Q12H  . gabapentin  300 mg Oral BID  . insulin aspart  0-5 Units Subcutaneous QHS  . insulin aspart  0-9 Units Subcutaneous TID WC  . insulin glargine  15 Units Subcutaneous QHS  . levothyroxine  25 mcg Oral QAC breakfast  . magnesium oxide  400 mg Oral Daily  . multivitamin  1 tablet Oral QHS  . pantoprazole  40 mg Oral Daily  . polyethylene glycol  17 g Oral Once  .  sevelamer carbonate  3,200 mg Oral TID WC  . sildenafil  40 mg Oral TID  . Warfarin - Pharmacist Dosing Inpatient   Does not apply q1800    Infusions: . sodium chloride    . sodium chloride    . sodium chloride    . sodium chloride      PRN Medications: sodium chloride, sodium chloride, sodium chloride, sodium chloride, acetaminophen, albuterol, alteplase, heparin, heparin, lidocaine (PF), lidocaine (PF), lidocaine-prilocaine, lidocaine-prilocaine, ondansetron (ZOFRAN) IV, pentafluoroprop-tetrafluoroeth, pentafluoroprop-tetrafluoroeth, traMADol, traZODone   Patient Profile   Trevor [name] is a 72 y.o. male with a history of CAD s/p CABG x 4 2010, OSA, AS with TAVR 2015, , ESRD on HD, DM2, chronic atrial fibrillation, asthma, and ischemic cardiomyopathy with Medtronic ICD.  S/p HMII LVAD for Destination therapy on 12/14/2015.   Presented to [facility] 02/23/18 with worsening SOB and malaise s/p HD.   Assessment/Plan:    1. 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  - Underwent Colon/EGD 02/27/18 with normal EGD. Colonoscopy with red blood and coffee-ground hematin was found in the entire colon (cecum to rectum). No source found for active bleeding despite copious irrigation and lavage and careful inspection. - Capsule Endoscopy 02/27/18: Showed active bleeding in the ileum; source not found.  Given clinical history and LVAD status this is likely related to active bleeding ileal angioectasias/angiodysplasias. Felt not to be reachable by double balloon at [facility] per Dr. Hilarie Fredrickson  - Continue octreotide injections.  - Hgb 9.2 today. Has had 4u RBCs. Continue to trend - GI starting SQ octreotide. Will continue doxy 100 bid  - Off ASA - INR 1.75. Will give low-dose warfarin tonight. Goal INR 1.8-2.0 for now. Discussed dosing with PharmD personally. - Suspect episode yesterday was histamine reaction due to RBC transfusion. Wil give benadryl with further transfusions.  2.  Acute bronchitis/URI - Covering with course of doxy. Now improved  3. Acute on chronic systolic CHF: Ischemic cardiomyopathy, EF 20-25% with RV dysfunction on 2/18 echo pre-LVAD. 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 Medtronic ICD. He has had problems with RV failure post-op. He developed worsening renal dysfunction and finally had to start HD in 8/18. There has been some difficulty getting adequate fluid off him, currently dialyzing 4 days/week.  -Volume status being well maintained with HD Continues to feel poorly after HD. Suspect episode yesterday was histamine reaction due to RBC transfusion. Wil give benadryl with further transfusions.  - Will discuss with Renal optimal volume status and timing to improve  QOL. Would try and get back to 3 times a week HD if possible.    - Option of PD raised with Renal and Surgery to see if he is a PD candidate. (see below)  - VAD interrogated personally. Parameters stable. 4. CAD: s/p CABG.  - No s/s of ischemia.  - Off ASA with bleeding - He is on atorvastatin.  5. ESRD:  - Renal following AVF has matured and being used.  - Felt poorly again after HD yesterday (also got 2u RBCs at the time) - With overall poor tolerance, may be able to consider PD. Discussions with surgery and renal ongoing.  - Seen by Dr. Kae Heller from [place]. Previous laparatomy is an obstacle. She discussed with Drs Johney Maine and Rosendo Gros who agreed.  - I will d/w [name] in am  6. Atrial fibrillation: Chronic.  - Rate controlled.  - Coumadin on hold with bleeding.  7. RV failure:  - Continue revatio 40 mg TID.  - No change to current plan.   8. HTN:  - MAP stable 70-80s 9. 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  - Holding off on radiation. He does not think he can handle radiation as well as HD 4 days/week.  - No change to current plan.   10. H/o TAVR:  - Stable on recent Echo.    I  reviewed the LVAD parameters from today, and compared the results to the patient's prior recorded data.  No programming changes were made.  The LVAD is functioning within specified parameters.  The patient performs LVAD self-test daily.  LVAD interrogation was negative for any significant power changes, alarms or PI events/speed drops.  LVAD equipment check completed and is in good working order.  Back-up equipment present.   LVAD education done on emergency procedures and precautions and reviewed exit site care.   Length of Stay: Catharine, MD 02/28/2018, 10:19 AM  VAD Team --- VAD ISSUES ONLY--- Pager [phone] (7am - 7am)  Advanced Heart Failure Team  Pager [phone] (M-F; 7a - 4p)  Please contact [facility] for night-coverage after hours (4p -7a ) and weekends on [email or web address]

## 2018-02-28 NOTE — Procedures (Signed)
Video Capsule Endoscopy -- 02/27/18 into 02/28/2018  Procedure Info and Findings: Complete study Adequate preparation.  1.  Active/recent bleeding with fresh blood in the ileum, beginning at 6 hrs, 58 min (about 70% of the way through the small bowel).  Due to blood filling the lumen and obscuring the mucosa, no source was seen.  2. Several very small angioectasias in the jejunum which are not bleeding.  3. Blood throughout the colon with no adequate colonic mucosal views  Summary and Recommendations: Active bleeding in the ileum; source not found.  Given clinical history and LVAD status this is likely related to active bleeding ileal angioectasias/angiodysplasias.  1. Hold warfarin, allow INR to decrease as much as possible per CHF team  2. Follow Hgb and repeat transfusions as needed  3. Begin subcutaneous octreotide for bleeding small bowel angioectasias.  4. Ileum (where bleeding) not reachable locally, and likely not reachable by anterograde double balloon enteroscopy at [facility].

## 2018-02-28 NOTE — Progress Notes (Signed)
ANTICOAGULATION CONSULT NOTE  Pharmacy Consult for warfarin Indication: LVAD  No Known Allergies  Patient Measurements: Height: 5\' 5"  (165.1 cm) Weight: (floor to weigh) IBW/kg (Calculated) : 61.5   Vital Signs: Temp: 98.4 F (36.9 C) (04/27 1315) Temp Source: Oral (04/27 1315) BP: 87/67 (04/27 1315) Pulse Rate: 74 (04/27 1315)  Labs: Recent Labs    02/26/18 0204 02/27/18 0250 02/28/18 0218  HGB 8.4* 8.2* 7.6*  HCT 25.2* 24.9* 23.3*  PLT 151 144* 122*  LABPROT 23.0* 25.2* 26.5*  INR 2.05 2.31 2.46  CREATININE 4.76* 2.99* 3.79*    Estimated Creatinine Clearance: 17.9 mL/min (A) (by C-G formula based on SCr of 3.79 mg/dL (H)).   Medical History: Past Medical History:  Diagnosis Date  . AICD (automatic cardioverter/defibrillator) present   . Asthma   . AVM (arteriovenous malformation) of colon 07/07/2017  . CHF (congestive heart failure) ([place])   . Diabetes ([place])   . ESRF (end stage renal failure) ([place]) 07/07/2017   pt receiving hemodialysis Monday- Wednesday-friday, sometimes Saturday  . Kidney disease   . LVAD (left ventricular assist device) present ([place]) 12/2016  . Permanent atrial fibrillation ([place]) 07/07/2017  . Presence of permanent cardiac pacemaker    AICD  . Sleep apnea      Assessment: 71yom with HF Hx LVAD HM2 presented to ED with SOB, feels bad.   INR on admit was supratherapeutic on 2.93 - dose was held 4/22. INR 2.4 will continue to hold warfarin for possible GI procedure s/p EGD and colonoscopy 4/26 and capsule study - shows more bleeding unreachable by scope.   Received two units ofPRBCs on 4/22 another unit PRBC 4/27 For low h/h on going GIB LDH stable at 224. Has hx of ESRD.   PTA warfarin dose 10mg  Tu/TH and 7.5mg  all other days   Has hx of colonic AVMs on colonoscopy on 4/18 admission.   Goal of Therapy:  INR Decrease 1.8-2.2 Monitor platelets by anticoagulation protocol: Yes   Plan:  Hold warfarin therapy for now Heparin if INR  falls < 1.8 - discuss with HF team prior to starting  Daily INR and monitor for signs/symptoms of bleeding  [name] Pharm.D. CPP, BCPS Clinical Pharmacist [phone] 02/28/2018 1:54 PM

## 2018-02-28 NOTE — Progress Notes (Signed)
Assessment/Plan: 1. Symptomatic anemia.GI bleed nondiagnostic endo/col/Capsule pending 2. ESRD -.(EDW 83kg) MWFSat. On HD now. No heparin  3. HTN/Volume- BP stable. CXR clear - UF goal 2L. Trying for EDW adjustments to optimize his experience 4. ICM/CHF EF 20-25% s/p LVAD/ICD - per cards. 5. CAD s/p CABG - Afib - on Coumadin per Card   Subjective: Interval History: On HD currently. Tol treatment.   Objective: Vital signs in last 24 hours: Temp:  [97.4 F (36.3 C)-98.4 F (36.9 C)] 98.4 F (36.9 C) (04/27 1145) Pulse Rate:  [40-81] 69 (04/27 1145) Resp:  [13-17] 15 (04/27 1145) BP: (68-127)/(42-93) 78/48 (04/27 1115) SpO2:  [97 %-99 %] 98 % (04/27 0845) Weight:  [87.2 kg (192 lb 3.9 oz)-87.2 kg (192 lb 4.8 oz)] 87.2 kg (192 lb 3.9 oz) (04/27 0845) Weight change: 2.294 kg (5 lb 0.9 oz)  Intake/Output from previous day: 04/26 0701 - 04/27 0700 In: 1222 [P.O.:822; I.V.:400] Out: 0  Intake/Output this shift: Total I/O In: 440 [P.O.:240; I.V.:200] Out: 400 [Urine:400]  General appearance: alert and cooperative GI: soft, non-tender; bowel sounds normal; no masses,  no organomegaly Extremities: tr  Lab Results: Recent Labs    02/27/18 0250 02/28/18 0218  WBC 11.0* 8.1  HGB 8.2* 7.6*  HCT 24.9* 23.3*  PLT 144* 122*   BMET:  Recent Labs    02/27/18 0250 02/28/18 0218  NA 133* 131*  K 3.9 4.1  CL 95* 93*  CO2 29 29  GLUCOSE 129* 235*  BUN 20 21*  CREATININE 2.99* 3.79*  CALCIUM 7.9* 8.4*   No results for input(s): PTH in the last 72 hours. Iron Studies: No results for input(s): IRON, TIBC, TRANSFERRIN, FERRITIN in the last 72 hours. Studies/Results: No results found.  Scheduled: . atorvastatin  40 mg Oral q1800  . budesonide  0.25 mg Inhalation BID  . busPIRone  5 mg Oral BID  . calcitRIOL  0.5 mcg Oral Q M,W,F-HD  . citalopram  20 mg Oral Daily  . docusate sodium  100 mg Oral Daily  . doxycycline  100 mg Oral Q12H  . gabapentin  300 mg Oral BID  .  insulin aspart  0-5 Units Subcutaneous QHS  . insulin aspart  0-9 Units Subcutaneous TID WC  . insulin glargine  15 Units Subcutaneous QHS  . levothyroxine  25 mcg Oral QAC breakfast  . magnesium oxide  400 mg Oral Daily  . multivitamin  1 tablet Oral QHS  . pantoprazole  40 mg Oral Daily  . polyethylene glycol  17 g Oral Once  . sevelamer carbonate  3,200 mg Oral TID WC  . sildenafil  40 mg Oral TID  . Warfarin - Pharmacist Dosing Inpatient   Does not apply q1800    LOS: 5 days   Estanislado Emms 02/28/2018,11:55 AM

## 2018-02-28 NOTE — Progress Notes (Addendum)
RN walked into room to check on pt. Upon walking in, pt states he is not feeling well. Concerned about hand swelling since HD. He reports he normally has a gradually decline in feeling bad post HD. This was sudden. He states he continues to feel bad even with PRN  ultram that was given several hours earlier. Now it is more breathing related though. PRN albuterol given-breath sounds are diminished with crackles, and wheezing. H&H placed for recheck. BP cuff 104/60 Doppler 78/58 VAD Coord-Molly notified.   Append: 1915: Pt reports still not feeling well,  "hard time getting air in:"  Breath sounds not much improved since treatment.  VAD team called again for CXR. Pt started shivering-Temp check 98.6.   1925: Pt reports feeling slightly better, but still not breathing well. "I can feel the congestion."  Xray Tech is here now for CXR.

## 2018-02-28 NOTE — Progress Notes (Signed)
Pt is receiving HD in the room. VSS at this time. Will continue to monitor.

## 2018-03-01 ENCOUNTER — Encounter (HOSPITAL_COMMUNITY): Payer: Self-pay | Admitting: Internal Medicine

## 2018-03-01 DIAGNOSIS — K922 Gastrointestinal hemorrhage, unspecified: Secondary | ICD-10-CM

## 2018-03-01 LAB — TYPE AND SCREEN
ABO/RH(D): A NEG
ANTIBODY SCREEN: NEGATIVE
UNIT DIVISION: 0
UNIT DIVISION: 0

## 2018-03-01 LAB — CBC
HEMATOCRIT: 27.8 % — AB (ref 39.0–52.0)
HEMOGLOBIN: 9.2 g/dL — AB (ref 13.0–17.0)
MCH: 30.6 pg (ref 26.0–34.0)
MCHC: 33.1 g/dL (ref 30.0–36.0)
MCV: 92.4 fL (ref 78.0–100.0)
Platelets: 104 10*3/uL — ABNORMAL LOW (ref 150–400)
RBC: 3.01 MIL/uL — ABNORMAL LOW (ref 4.22–5.81)
RDW: 19.4 % — AB (ref 11.5–15.5)
WBC: 9.6 10*3/uL (ref 4.0–10.5)

## 2018-03-01 LAB — RENAL FUNCTION PANEL
ANION GAP: 7 (ref 5–15)
Albumin: 3 g/dL — ABNORMAL LOW (ref 3.5–5.0)
BUN: 14 mg/dL (ref 6–20)
CALCIUM: 8.2 mg/dL — AB (ref 8.9–10.3)
CO2: 30 mmol/L (ref 22–32)
Chloride: 96 mmol/L — ABNORMAL LOW (ref 101–111)
Creatinine, Ser: 2.88 mg/dL — ABNORMAL HIGH (ref 0.61–1.24)
GFR calc Af Amer: 24 mL/min — ABNORMAL LOW (ref >60)
GFR, EST NON AFRICAN AMERICAN: 20 mL/min — AB (ref >60)
GLUCOSE: 212 mg/dL — AB (ref 65–99)
PHOSPHORUS: 2.9 mg/dL (ref 2.5–4.6)
POTASSIUM: 3.7 mmol/L (ref 3.5–5.1)
SODIUM: 133 mmol/L — AB (ref 135–145)

## 2018-03-01 LAB — BPAM RBC
Blood Product Expiration Date: 201905172359
Blood Product Expiration Date: 201905182359
ISSUE DATE / TIME: 201904271146
ISSUE DATE / TIME: 201904271146
UNIT TYPE AND RH: 600
Unit Type and Rh: 600

## 2018-03-01 LAB — LACTATE DEHYDROGENASE: LDH: 222 U/L — ABNORMAL HIGH (ref 98–192)

## 2018-03-01 LAB — PROTIME-INR
INR: 1.75
Prothrombin Time: 20.3 seconds — ABNORMAL HIGH (ref 11.4–15.2)

## 2018-03-01 LAB — GLUCOSE, CAPILLARY
GLUCOSE-CAPILLARY: 144 mg/dL — AB (ref 65–99)
GLUCOSE-CAPILLARY: 124 mg/dL — AB (ref 65–99)
GLUCOSE-CAPILLARY: 209 mg/dL — AB (ref 65–99)
Glucose-Capillary: 181 mg/dL — ABNORMAL HIGH (ref 65–99)

## 2018-03-01 MED ORDER — WARFARIN SODIUM 5 MG PO TABS
5.0000 mg | ORAL_TABLET | Freq: Once | ORAL | Status: AC
  Administered 2018-03-01: 5 mg via ORAL
  Filled 2018-03-01: qty 1

## 2018-03-01 MED ORDER — OCTREOTIDE ACETATE 50 MCG/ML IJ SOLN
50.0000 ug | Freq: Three times a day (TID) | INTRAMUSCULAR | Status: DC
  Administered 2018-03-01 – 2018-03-05 (×13): 50 ug via SUBCUTANEOUS
  Filled 2018-03-01 (×16): qty 1

## 2018-03-01 MED ORDER — DOXYCYCLINE HYCLATE 100 MG PO TABS
100.0000 mg | ORAL_TABLET | Freq: Two times a day (BID) | ORAL | Status: DC
  Administered 2018-03-01 – 2018-03-05 (×8): 100 mg via ORAL
  Filled 2018-03-01 (×8): qty 1

## 2018-03-01 NOTE — Progress Notes (Signed)
Advanced Heart Failure VAD Team Note  PCP-Cardiologist: No primary care provider on file.   Subjective:    Hgb 7.7 -> s/p 2 UPRBCs -> 8.9 -> 9.1 -> 8.4 with dark stools -> 8.2.  Underwent Colon/EGD on 4/26 with normal EGD. Colonoscopy with red blood and coffee-ground hematin was found in the entire colon (cecum to rectum). No source found for active bleeding despite copious irrigation and lavage and careful inspection. Now s/p capsule endoscopy. Results pending   Getting HD in room now. Hgb back down to 7.6. Denies CP or SOB.   LVAD Interrogation HM 2: Speed: 9600 Flow: 5.8 PI: 4.8 Power: 7.0.  Objective:    Vital Signs:                 Temp:  [97.4 F (36.3 C)-98.3 F (36.8 C)] 97.5 F (36.4 C) (04/27 0845) Pulse Rate:  [40-81] 71 (04/27 1000) Resp:  [13-22] 13 (04/27 0945) BP: (80-127)/(54-93) 99/80 (04/27 0945) SpO2:  [97 %-100 %] 98 % (04/27 0845) Weight:  [87.2 kg (192 lb 3.9 oz)-87.2 kg (192 lb 4.8 oz)] 87.2 kg (192 lb 3.9 oz) (04/27 0845) Last BM Date: 02/27/18   Mean arterial Pressure 80s  Intake/Output:             Intake/Output Summary (Last 24 hours) at 02/28/2018 1019 Last data filed at 02/28/2018 0900    Gross per 24 hour  Intake 1462 ml  Output 400 ml  Net 1062 ml                Physical Exam    General:  NAD. On HD in room  HEENT: normal  Neck: supple. JVP 8-9  Carotids 2+ bilat; no bruits. No lymphadenopathy or thryomegaly appreciated. Cor: LVAD hum.  Lungs: Clear. Abdomen: obese soft, nontender, non-distended. No hepatosplenomegaly. No bruits or masses. Good bowel sounds. Driveline site clean. Anchor in place.  Extremities: no cyanosis, clubbing, rash. Warm trace edema  Neuro: alert & oriented x 3. No focal deficits. Moves all 4 without problem    Telemetry   Afib 70s, personally reviewed.   EKG    No new tracings.    Labs   Basic Metabolic Panel: LastLabs  Recent Labs  Lab 02/24/18 0303 02/25/18 0607  02/26/18 0204 02/27/18 0250 02/28/18 0218  NA 130* 133* 130* 133* 131*  K 3.7 3.5 3.8 3.9 4.1  CL 92* 94* 93* 95* 93*  CO2 28 27 25 29 29   GLUCOSE 286* 69 175* 129* 235*  BUN 25* 38* 48* 20 21*  CREATININE 3.17* 4.16* 4.76* 2.99* 3.79*  CALCIUM 8.3* 8.6* 8.2* 7.9* 8.4*  PHOS  --   --  4.3  --  3.0      Liver Function Tests: LastLabs      Recent Labs  Lab 02/26/18 0204 02/28/18 0218  ALBUMIN 3.4* 3.1*     LastLabs  No results for input(s): LIPASE, AMYLASE in the last 168 hours.   LastLabs  No results for input(s): AMMONIA in the last 168 hours.    CBC: LastLabs          Recent Labs  Lab 02/24/18 0303 02/24/18 0722 02/25/18 0607 02/26/18 0204 02/27/18 0250 02/28/18 0218  WBC 10.3  --  9.3 10.1 11.0* 8.1  HGB 8.1* 8.9* 9.1* 8.4* 8.2* 7.6*  HCT 24.2* 26.5* 27.3* 25.2* 24.9* 23.3*  MCV 94.5  --  92.5 92.3 94.7 94.3  PLT 163  --  146* 151 144* 122*  INR: LastLabs         Recent Labs  Lab 02/24/18 0303 02/25/18 0607 02/26/18 0204 02/27/18 0250 02/28/18 0218  INR 2.61 2.17 2.05 2.31 2.46      Other results:    Imaging     ImagingResults(Last48hours)  No results found.     Medications:     Scheduled Medications:  .  atorvastatin   40 mg  Oral  q1800   .  budesonide   0.25 mg  Inhalation  BID   .  busPIRone   5 mg  Oral  BID   .  calcitRIOL   0.5 mcg  Oral  Q M,W,F-HD   .  citalopram   20 mg  Oral  Daily   .  docusate sodium   100 mg  Oral  Daily   .  doxycycline   100 mg  Oral  Q12H   .  gabapentin   300 mg  Oral  BID   .  insulin aspart   0-5 Units  Subcutaneous  QHS   .  insulin aspart   0-9 Units  Subcutaneous  TID WC   .  insulin glargine   15 Units  Subcutaneous  QHS   .  levothyroxine   25 mcg  Oral  QAC breakfast   .  magnesium oxide   400 mg  Oral  Daily   .  multivitamin   1 tablet  Oral  QHS   .  pantoprazole   40  mg  Oral  Daily   .  polyethylene glycol   17 g  Oral  Once   .  sevelamer carbonate   3,200 mg  Oral  TID WC   .  sildenafil   40 mg  Oral  TID   .  Warfarin - Pharmacist Dosing Inpatient     Does not apply  q1800     Infusions:  .  sodium chloride      .  sodium chloride      .  sodium chloride      .  sodium chloride        PRN Medications:  sodium chloride, sodium chloride, sodium chloride, sodium chloride, acetaminophen, albuterol, alteplase, heparin, heparin, lidocaine (PF), lidocaine (PF), lidocaine-prilocaine, lidocaine-prilocaine, ondansetron (ZOFRAN) IV, pentafluoroprop-tetrafluoroeth, pentafluoroprop-tetrafluoroeth, traMADol, traZODone   Patient Profile   Trevor [name] a 72 y.o.malewith a history of CAD s/p CABG x 4 2010, OSA, AS with TAVR 2015, ,ESRD on HD,DM2,chronicatrial fibrillation, asthma, and ischemic cardiomyopathy with Medtronic ICD.S/p HMII LVAD for Destination therapy on 12/14/2015.  Presented to [facility] 02/23/18 with worsening SOB and malaise s/p HD.  Assessment/Plan:    1. GI bleeding:Colonic AVMs on colonoscopy 4/18 admission, APC + clipping.  - Underwent Colon/EGD 02/27/18 with normal EGD. Colonoscopy with red blood and coffee-ground hematin was found in the entire colon (cecum to rectum). No source found for active bleeding despite copious irrigation and lavage and careful inspection. - Capsule Endoscopy administered 02/27/18 -> await results  - Continueoctreotide injections. - Hgb 7.7 -> 8.9 s/p 2 uPRBCs -> 9.1 -> 8.4 Now with Dark stools. -> 8.2-> 7.6 - Will transfuse 2 more units RBCs - INR 2.46. Coumadin on hold for now with active bleeding  2. Acute bronchitis/URI - Covering with course of doxy. Now improved  - Will complete 7 day course.  3.Acute on chronic systolic CHF: Ischemic cardiomyopathy, EF 20-25% with RV dysfunction on 2/18 echo pre-LVAD. 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 Medtronic  ICD. He has had problems with RV failure post-op. He developed worsening renal dysfunction and finally had to start HD in 8/18. There has been some difficulty getting adequate fluid off him, currently dialyzing 4 days/week. -Volume status being well maintained with HD - Will discuss with Renal optimal volume status and timing to improve QOL. Would try and get back to 3 times a week HD if possible.   We are working with Renal and Surgery to see if he is a PD candidate. (see below)  - VAD interrogated personally. Parameters stable. 4. CAD: s/p CABG. - No s/s of ischemia.    -He is on atorvastatin. 5. ESRD:  - On HD today. Appreciate Renal following Fistula has matured and being used.  - With overall poor tolerance, may be able to consider PD. Discussions with surgery and renal ongoing. Seen by Dr. Kae Heller from [place]. Previous laparatomy is an obstacle. She is discussion with Drs Johney Maine and Rosendo Gros. 6. Atrial fibrillation: Chronic. - Rate controlled.  - Coumadin on hold with bleeding. Discussed dosing with Pharm-D personally.  7. RV failure: - Continue revatio 40 mg TID.  - No change to current plan.   8. HTN: - MAP stable 70-80s 9. 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 - Holding off on radiation. Hedoes not think he can handle radiationas well asHD4days/week.  - No change to current plan.   10. H/o TAVR: - Stable on recent Echo.    I reviewed the LVAD parameters from today, and compared the results to the patient's prior recorded data.  No programming changes were made.  The LVAD is functioning within specified parameters.  The patient performs LVAD self-test daily.  LVAD interrogation was negative for any significant power changes, alarms or PI events/speed drops.  LVAD equipment check completed and is in good working order.  Back-up equipment present.   LVAD education done on  emergency procedures and precautions and reviewed exit site care.   Length of Stay: [place], MD 02/28/2018, 10:19 AM  VAD Team --- VAD ISSUES ONLY--- Pager [phone] (7am - 7am)  Advanced Heart Failure Team  Pager [phone] (M-F; 7a - 4p)  Please contact [facility] for night-coverage after hours (4p -7a ) and weekends on [email or web address]

## 2018-03-01 NOTE — Progress Notes (Signed)
ANTICOAGULATION CONSULT NOTE  Pharmacy Consult for warfarin Indication: LVAD  No Known Allergies  Patient Measurements: Height: 5\' 5"  (165.1 cm) Weight: 192 lb 7.4 oz (87.3 kg) IBW/kg (Calculated) : 61.5   Vital Signs: Temp: 98 F (36.7 C) (04/28 0802) Temp Source: Oral (04/28 0802) BP: 100/76 (04/28 0802) Pulse Rate: 64 (04/28 0802)  Labs: Recent Labs    02/27/18 0250 02/28/18 0218 02/28/18 1923 03/01/18 0230  HGB 8.2* 7.6* 10.0* 9.2*  HCT 24.9* 23.3* 29.7* 27.8*  PLT 144* 122*  --  104*  LABPROT 25.2* 26.5* 20.9* 20.3*  INR 2.31 2.46 1.82 1.75  CREATININE 2.99* 3.79*  --  2.88*    Estimated Creatinine Clearance: 23.5 mL/min (A) (by C-G formula based on SCr of 2.88 mg/dL (H)).   Medical History: Past Medical History:  Diagnosis Date  . AICD (automatic cardioverter/defibrillator) present   . Asthma   . AVM (arteriovenous malformation) of colon 07/07/2017  . CHF (congestive heart failure) ([place])   . Diabetes ([place])   . ESRF (end stage renal failure) ([place]) 07/07/2017   pt receiving hemodialysis Monday- Wednesday-friday, sometimes Saturday  . Kidney disease   . LVAD (left ventricular assist device) present ([place]) 12/2016  . Permanent atrial fibrillation ([place]) 07/07/2017  . Presence of permanent cardiac pacemaker    AICD  . Sleep apnea      Assessment: 71yom with HF Hx LVAD HM2 presented to ED with SOB, feels bad.   INR on admit was supratherapeutic on 2.93 - dose was held 4/22. INR 1.75 s/p holding for GI procedures s/p EGD and colonoscopy 4/26 and capsule study - shows more bleeding unreachable by scope.   Received two units ofPRBCs on 4/22 another unit PRBC 4/27 For low h/h on going GIB LDH stable at 224. Has hx of ESRD.   Plan to restart warfarin at lower dose and monitor bleeding - discuss need for heparin bridge with MD tomorrow with repeat CBC  PTA warfarin dose 10mg  Tu/TH and 7.5mg  all other days   Has hx of colonic AVMs on colonoscopy on 4/18  admission.   Goal of Therapy:  INR Decrease 1.8-2.2 Monitor platelets by anticoagulation protocol: Yes   Plan:  Warfarin 5mg  x1 today Increase octreotide 41mcg sq tid to help manage bleeding Continue doxy 100mg  BID - literature to support decreased risk GIB in LVAD Daily INR and monitor for signs/symptoms of bleeding  [name] Pharm.D. CPP, BCPS Clinical Pharmacist [phone] 03/01/2018 11:50 AM

## 2018-03-01 NOTE — Progress Notes (Signed)
Assessment/Plan: 1. Symptomatic anemia.GI bleed nondiagnostic endo/col/Capsule small angiectasias in jejunum-blood in jejunum, no active bleeding, now sq octreotide 2. ESRD -(EDW 83kg) MWFSat.  No heparin---I am not convinced the events 2 hours post HD are dialysis related. Weight has drifted upward and no obvious clinical improvement.  It may be better to rethink options of dialysis after GI issues resolved and some time with a higher EDW, reassurance and with his primary nephrologist.   Peritoneal dialysis will present a different set of complications. 3. HTN/Volume- BP stable. CXR clear - UF goal 2L. Trying for EDW adjustments to optimize his experience 4. ICM/CHF EF 20-25% s/p LVAD/ICD - per cards. 5. CAD s/p CABG - Afib - on Coumadinper Card   Subjective: Interval History: two hours after dialysis had an event with SOB and swelling of hands, CXR was neg and he got nebs and was getting PRBCs  Objective: Vital signs in last 24 hours: Temp:  [98 F (36.7 C)-98.6 F (37 C)] 98 F (36.7 C) (04/28 0802) Pulse Rate:  [61-82] 64 (04/28 0802) Resp:  [12-25] 14 (04/28 0802) BP: (84-110)/(45-79) 100/76 (04/28 0802) SpO2:  [97 %-100 %] 97 % (04/28 0802) Weight:  [87.3 kg (192 lb 7.4 oz)] 87.3 kg (192 lb 7.4 oz) (04/28 0602) Weight change: 1.606 kg (3 lb 8.7 oz)  Intake/Output from previous day: 04/27 0701 - 04/28 0700 In: 1550 [P.O.:720; I.V.:200; Blood:630] Out: 1610 [Urine:400] Intake/Output this shift: Total I/O In: -  Out: 250 [Urine:250]  General appearance: alert, cooperative and appears stated age  Appropriate Ext tr to 1+  Lab Results: Recent Labs    02/28/18 0218 02/28/18 1923 03/01/18 0230  WBC 8.1  --  9.6  HGB 7.6* 10.0* 9.2*  HCT 23.3* 29.7* 27.8*  PLT 122*  --  104*   BMET:  Recent Labs    02/28/18 0218 03/01/18 0230  NA 131* 133*  K 4.1 3.7  CL 93* 96*  CO2 29 30  GLUCOSE 235* 212*  BUN 21* 14  CREATININE 3.79* 2.88*  CALCIUM 8.4* 8.2*   No  results for input(s): PTH in the last 72 hours. Iron Studies: No results for input(s): IRON, TIBC, TRANSFERRIN, FERRITIN in the last 72 hours. Studies/Results: Dg Chest Port 1 View  Result Date: 02/28/2018 CLINICAL DATA:  Patient reports sudden onset of SOB this afternoon while lying in his bed (nurse later told me related to post dialysis treatment). Pain in center of chest especially when coughing. Hx of AICD present, asthma, CHF, diabetic, LVAD present, permanent atrial fibrillation, pacemaker present. 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 Non smoker. EXAM: PORTABLE CHEST 1 VIEW COMPARISON:  02/23/2018 FINDINGS: Changes from cardiac surgery with placement of a left ventricular assist device as well as artificial valve placement, are stable. Cardiac silhouette is mildly enlarged. No mediastinal or hilar masses. No convincing adenopathy. Right anterior chest wall AICD is stable. Lungs are clear.  No convincing pleural effusion.  No pneumothorax. Skeletal structures are grossly intact. IMPRESSION: 1. No acute cardiopulmonary disease. Stable appearance from the prior study. Electronically Signed   By: Lajean Manes M.D.   On: 02/28/2018 19:57    Scheduled: . atorvastatin  40 mg Oral q1800  . budesonide  0.25 mg Inhalation BID  . busPIRone  5 mg Oral BID  . calcitRIOL  0.5 mcg Oral Q M,W,F-HD  . citalopram  20 mg Oral Daily  . docusate sodium  100 mg Oral Daily  . doxycycline  100 mg Oral Q12H  . gabapentin  300 mg Oral BID  . insulin aspart  0-5 Units Subcutaneous QHS  . insulin aspart  0-9 Units Subcutaneous TID WC  . insulin glargine  15 Units Subcutaneous QHS  . levothyroxine  25 mcg Oral QAC breakfast  . magnesium oxide  400 mg Oral Daily  . multivitamin  1 tablet Oral QHS  . octreotide  50 mcg  Subcutaneous TID  . pantoprazole  40 mg Oral Daily  . polyethylene glycol  17 g Oral Once  . sevelamer carbonate  3,200 mg Oral TID WC  . sildenafil  40 mg Oral TID  . warfarin  5 mg Oral ONCE-1800  . Warfarin - Pharmacist Dosing Inpatient   Does not apply q1800    LOS: 6 days   Trevor [name] 03/01/2018,12:01 PM

## 2018-03-02 LAB — GLUCOSE, CAPILLARY
GLUCOSE-CAPILLARY: 76 mg/dL (ref 65–99)
GLUCOSE-CAPILLARY: 174 mg/dL — AB (ref 65–99)
Glucose-Capillary: 216 mg/dL — ABNORMAL HIGH (ref 65–99)
Glucose-Capillary: 117 mg/dL — ABNORMAL HIGH (ref 65–99)

## 2018-03-02 LAB — LACTATE DEHYDROGENASE: LDH: 201 U/L — ABNORMAL HIGH (ref 98–192)

## 2018-03-02 LAB — CBC
HCT: 25.9 % — ABNORMAL LOW (ref 39.0–52.0)
Hemoglobin: 8.5 g/dL — ABNORMAL LOW (ref 13.0–17.0)
MCH: 30.7 pg (ref 26.0–34.0)
MCHC: 32.8 g/dL (ref 30.0–36.0)
MCV: 93.5 fL (ref 78.0–100.0)
PLATELETS: 87 10*3/uL — AB (ref 150–400)
RBC: 2.77 MIL/uL — ABNORMAL LOW (ref 4.22–5.81)
RDW: 18.8 % — AB (ref 11.5–15.5)
WBC: 8.6 10*3/uL (ref 4.0–10.5)

## 2018-03-02 LAB — RENAL FUNCTION PANEL
ALBUMIN: 3.2 g/dL — AB (ref 3.5–5.0)
Anion gap: 6 (ref 5–15)
BUN: 28 mg/dL — AB (ref 6–20)
CO2: 29 mmol/L (ref 22–32)
CREATININE: 3.94 mg/dL — AB (ref 0.61–1.24)
Calcium: 8.2 mg/dL — ABNORMAL LOW (ref 8.9–10.3)
Chloride: 97 mmol/L — ABNORMAL LOW (ref 101–111)
GFR, EST AFRICAN AMERICAN: 16 mL/min — AB (ref >60)
GFR, EST NON AFRICAN AMERICAN: 14 mL/min — AB (ref >60)
Glucose, Bld: 105 mg/dL — ABNORMAL HIGH (ref 65–99)
PHOSPHORUS: 3.1 mg/dL (ref 2.5–4.6)
POTASSIUM: 3.9 mmol/L (ref 3.5–5.1)
Sodium: 132 mmol/L — ABNORMAL LOW (ref 135–145)

## 2018-03-02 LAB — PROTIME-INR
INR: 1.5
PROTHROMBIN TIME: 18 s — AB (ref 11.4–15.2)

## 2018-03-02 MED ORDER — WARFARIN SODIUM 7.5 MG PO TABS
7.5000 mg | ORAL_TABLET | Freq: Once | ORAL | Status: AC
  Administered 2018-03-02: 7.5 mg via ORAL
  Filled 2018-03-02: qty 1

## 2018-03-02 MED ORDER — DARBEPOETIN ALFA 60 MCG/0.3ML IJ SOSY
60.0000 ug | PREFILLED_SYRINGE | INTRAMUSCULAR | Status: DC
  Administered 2018-03-04: 60 ug via INTRAVENOUS
  Filled 2018-03-02: qty 0.3

## 2018-03-02 NOTE — Progress Notes (Signed)
LVAD Coordinator Rounding Note:  Admitted 02/23/18 per [name] due to symptomatic anemia.   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 Darcey Nora under DT criteria due to age excluding heart transplant.     Vital signs: Temp: 98.1 HR: 72 Doppler Pressure: 102 Automatic BP:  111/95 (102) O2 Sat: 99% on RA Wt: 180>180>185>188>183>188>188>195 lbs  LVAD interrogation reveals:  Speed: 9600 Flow: 6.2 Power:  6.0 PI: 3.9 Alarms: none Events:  2 PI events Fixed speed: 9600 Low speed limit: 9000  Drive Line:  Right abdominal sorbaview dressing dry and intact with 2 large tegaderms covering. Anchor intact and accurately applied. Next dressing change due 4/30.  Labs:  LDH trend: 257>232>224>235>217>201  INR trend: 2.93> 2.61>2.17>2.05>2.31>1.50  Hgb trend: 7.7>8.1>8.9>9.1>8.4>8.2>8.5  Anticoagulation Plan: -INR Goal:  2.0 - 2.5 -ASA Dose: no ASA due to hx of GI bleed  Blood Products:  -02/23/18>2 units PCs   Device: - Medtronic single lead -Therapies: on at 231 bpm  Adverse Events on VAD: 02/2017> GIB- AVM clipped x2 07/2017> renal failure- HD started 11/2017>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 Margins not clear at neck.   Plan/Recommendations:   1. Weekly VAD drive line dressing changes using weekly kit and no bio patch. Rinse area with sterile saline after cleansing with Chlora prep. Bedside RN may change dressing. Next dressing change due 03/03/18. 2. Please call VAD pager if any VAD equipment or drive line issues.  [name], VAD Coordinator 24/7 VAD pager: [phone]

## 2018-03-02 NOTE — Progress Notes (Signed)
Patient ID: Trevor [name], male   DOB: Aug 09, 1946, 72 y.o.   MRN: [id number]   Advanced Heart Failure VAD Team Note  PCP-Cardiologist: No primary care provider on file.   Subjective:    Events: - 4/25 2u RBCs - 4/26 Colon/EGD - Colonoscopy with red blood and coffee-ground hematin was found in the entire colon (cecum to rectum). No source found for active bleeding despite copious irrigation and lavage and careful inspection.  - 4/26 capsule endo - Capsule endoscopy showed active bleeding in the ileum; source not found.  Given clinical history and LVAD status this is likely related to active bleeding ileal angioectasias/angiodysplasias. - 4/27 2u RBCs  He is on [place] octreotide and doxycycline currently, no overt bleeding over the last day but no BM.  Breathing ok today.   INR 1.5  LVAD Interrogation HM 2: Speed: 9600 Flow: 5.4 PI: 5.8 Power: 6.1, 1 PI event.   Objective:    Vital Signs:   Temp:  [98 F (36.7 C)-98.7 F (37.1 C)] 98 F (36.7 C) (04/29 0347) Pulse Rate:  [63-66] 66 (04/28 1500) Resp:  [12-18] 18 (04/28 2053) BP: (86-120)/(62-78) 97/77 (04/29 0347) SpO2:  [97 %-98 %] 98 % (04/28 2053) Weight:  [195 lb (88.5 kg)] 195 lb (88.5 kg) (04/29 0352) Last BM Date: 02/27/18   Mean arterial Pressure 80s  Intake/Output:   Intake/Output Summary (Last 24 hours) at 03/02/2018 0755 Last data filed at 03/02/2018 0347 Gross per 24 hour  Intake 240 ml  Output 1000 ml  Net -760 ml     Physical Exam    GENERAL: Well appearing this am. NAD.  HEENT: Normal. NECK: Supple, JVP 8 cm. Carotids OK.  CARDIAC:  Mechanical heart sounds with LVAD hum present.  LUNGS:  CTAB, normal effort.  ABDOMEN:  NT, ND, no HSM. No bruits or masses. +BS  LVAD exit site: Well-healed and incorporated. Dressing dry and intact. No erythema or drainage. Stabilization device present and accurately applied. Driveline dressing changed daily per sterile technique. EXTREMITIES:  Warm and dry. No cyanosis,  clubbing, rash. 1+ chronic edema to knees.   NEUROLOGIC:  Alert & oriented x 3. Cranial nerves grossly intact. Moves all 4 extremities w/o difficulty. Affect pleasant    Telemetry   Afib 70s, personally reviewed.   EKG    No new tracings.    Labs   Basic Metabolic Panel: Recent Labs  Lab 02/26/18 0204 02/27/18 0250 02/28/18 0218 03/01/18 0230 03/02/18 0448  NA 130* 133* 131* 133* 132*  K 3.8 3.9 4.1 3.7 3.9  CL 93* 95* 93* 96* 97*  CO2 25 29 29 30 29   GLUCOSE 175* 129* 235* 212* 105*  BUN 48* 20 21* 14 28*  CREATININE 4.76* 2.99* 3.79* 2.88* 3.94*  CALCIUM 8.2* 7.9* 8.4* 8.2* 8.2*  PHOS 4.3  --  3.0 2.9 3.1    Liver Function Tests: Recent Labs  Lab 02/26/18 0204 02/28/18 0218 03/01/18 0230 03/02/18 0448  ALBUMIN 3.4* 3.1* 3.0* 3.2*   No results for input(s): LIPASE, AMYLASE in the last 168 hours. No results for input(s): AMMONIA in the last 168 hours.  CBC: Recent Labs  Lab 02/26/18 0204 02/27/18 0250 02/28/18 0218 02/28/18 1923 03/01/18 0230 03/02/18 0448  WBC 10.1 11.0* 8.1  --  9.6 8.6  HGB 8.4* 8.2* 7.6* 10.0* 9.2* 8.5*  HCT 25.2* 24.9* 23.3* 29.7* 27.8* 25.9*  MCV 92.3 94.7 94.3  --  92.4 93.5  PLT 151 144* 122*  --  104* 87*  INR: Recent Labs  Lab 02/27/18 0250 02/28/18 0218 02/28/18 1923 03/01/18 0230 03/02/18 0448  INR 2.31 2.46 1.82 1.75 1.50    Other results:    Imaging   Dg Chest Port 1 View  Result Date: 02/28/2018 CLINICAL DATA:  Patient reports sudden onset of SOB this afternoon while lying in his bed (nurse later told me related to post dialysis treatment). Pain in center of chest especially when coughing. Hx of AICD present, asthma, CHF, diabetic, LVAD present, permanent atrial fibrillation, pacemaker present. 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 Non smoker. EXAM: PORTABLE CHEST 1 VIEW COMPARISON:  02/23/2018 FINDINGS: Changes from cardiac surgery with placement of a left ventricular assist device as well as artificial valve placement, are stable. Cardiac silhouette is mildly enlarged. No mediastinal or hilar masses. No convincing adenopathy. Right anterior chest wall AICD is stable. Lungs are clear.  No convincing pleural effusion.  No pneumothorax. Skeletal structures are grossly intact. IMPRESSION: 1. No acute cardiopulmonary disease. Stable appearance from the prior study. Electronically Signed   By: Lajean Manes M.D.   On: 02/28/2018 19:57     Medications:     Scheduled Medications: . atorvastatin  40 mg Oral q1800  . budesonide  0.25 mg Inhalation BID  . busPIRone  5 mg Oral BID  . calcitRIOL  0.5 mcg Oral Q M,W,F-HD  . citalopram  20 mg Oral Daily  . docusate sodium  100 mg Oral Daily  . doxycycline  100 mg Oral Q12H  . gabapentin  300 mg Oral BID  . insulin aspart  0-5 Units Subcutaneous QHS  . insulin aspart  0-9 Units Subcutaneous TID WC  . insulin glargine  15 Units Subcutaneous QHS  . levothyroxine  25 mcg Oral QAC breakfast  . magnesium oxide  400 mg Oral Daily  . multivitamin  1 tablet Oral QHS  . octreotide  50 mcg Subcutaneous TID  . pantoprazole  40 mg Oral Daily  . polyethylene glycol  17 g Oral Once  . sevelamer carbonate  3,200 mg Oral TID WC  . sildenafil  40 mg Oral TID  . Warfarin - Pharmacist Dosing Inpatient   Does not apply q1800    Infusions: . sodium chloride    . sodium chloride    . sodium chloride    . sodium chloride    . sodium chloride      PRN Medications: sodium chloride, sodium chloride, sodium chloride, sodium chloride, acetaminophen, albuterol, alteplase, heparin, heparin, lidocaine (PF), lidocaine (PF), lidocaine-prilocaine, lidocaine-prilocaine, ondansetron (ZOFRAN) IV, pentafluoroprop-tetrafluoroeth, pentafluoroprop-tetrafluoroeth, traMADol,  traZODone   Patient Profile   Trevor [name] is a 72 y.o. male with a history of CAD s/p CABG x 4 2010, OSA, AS with TAVR 2015, , ESRD on HD, DM2, chronic atrial fibrillation, asthma, and ischemic cardiomyopathy with Medtronic ICD.  S/p HMII LVAD for Destination therapy on 12/14/2015.   [facility]18 with worsening SOB and malaise s/p HD.   Assessment/Plan:    1. 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 Underwent Colon/EGD 02/27/18 with normal EGD. Colonoscopy with red blood and coffee-ground heme was found in the entire colon (cecum to rectum). No source found for active bleeding despite copious irrigation and lavage and careful inspection.  Capsule Endoscopy 02/27/18: Showed active bleeding in the ileum; source not found.  Given clinical history  and LVAD status this is likely related to active bleeding ileal angioectasias/angiodysplasias. Felt not to be reachable by double balloon at [facility] per Dr. Hilarie Fredrickson.  Hgb down a bit to 8.5 today, no overt bleeding.  - Continue octreotide injections [place] acutely. - He has been started on doxycycline 100 bid to try to limit AVM bleeding (and for URI sx).   - Off ASA - INR 1.5. Goal INR will be 1.8-2.3 at this point, aim for 1.8-2 in hospital currently.  - Had suspected histamine reaction due to RBC transfusion. Will give benadryl with further transfusions.  2. Acute bronchitis/URI: Covering with course of doxy. Now improved.  3. Acute on chronic systolic CHF: Ischemic cardiomyopathy, EF 20-25% with RV dysfunction on 2/18 echo pre-LVAD. 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 Medtronic ICD. He has had problems with RV failure post-op. He developed worsening renal dysfunction and finally had to start HD in 8/18. There has been some difficulty getting adequate fluid off him, currently dialyzing 4 days/week.  - VAD interrogated personally. Parameters stable. 4. CAD: s/p CABG. No chest pain.   - Off ASA with bleeding - He is on  atorvastatin.  5. ESRD: Renal following AVF has matured and being used.  He tolerates HD poorly, has very poor QOL on days of HD.  We discussed possibility of PD with nephrology and surgery, but concensus appears to be that it would not be feasible to place PD catheter.   - Would request from nephrology that we try to find a way to decrease HD to three days a week for QOL, allowing somewhat higher dry weight.  6. Atrial fibrillation: Chronic. Rate controlled.  7. RV failure:  - Continue revatio 40 mg TID.  - No change to current plan.   8. HTN: MAP stable 70-80s 9. 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  - Holding off on radiation. He does not think he can handle radiation as well as HD 4 days/week.  - No change to current plan.   10. H/o TAVR:  - Stable on recent Echo.   I reviewed the LVAD parameters from today, and compared the results to the patient's prior recorded data.  No programming changes were made.  The LVAD is functioning within specified parameters.  The patient performs LVAD self-test daily.  LVAD interrogation was negative for any significant power changes, alarms or PI events/speed drops.  LVAD equipment check completed and is in good working order.  Back-up equipment present.   LVAD education done on emergency procedures and precautions and reviewed exit site care.   Length of Stay: 7  Loralie Champagne, MD 03/02/2018, 7:55 AM  VAD Team --- VAD ISSUES ONLY--- Pager [phone] (7am - 7am)  Advanced Heart Failure Team  Pager [phone] (M-F; 7a - 4p)  Please contact [facility] for night-coverage after hours (4p -7a ) and weekends on [email or web address]

## 2018-03-02 NOTE — Progress Notes (Signed)
ANTICOAGULATION CONSULT NOTE  Pharmacy Consult for warfarin Indication: LVAD  No Known Allergies  Patient Measurements: Height: 5\' 5"  (165.1 cm) Weight: 195 lb (88.5 kg) IBW/kg (Calculated) : 61.5   Vital Signs: Temp: 98.1 F (36.7 C) (04/29 0811) Temp Source: Oral (04/29 0811) BP: 93/44 (04/29 0811) Pulse Rate: 80 (04/29 0811)  Labs: Recent Labs    02/28/18 0218 02/28/18 1923 03/01/18 0230 03/02/18 0448  HGB 7.6* 10.0* 9.2* 8.5*  HCT 23.3* 29.7* 27.8* 25.9*  PLT 122*  --  104* 87*  LABPROT 26.5* 20.9* 20.3* 18.0*  INR 2.46 1.82 1.75 1.50  CREATININE 3.79*  --  2.88* 3.94*    Estimated Creatinine Clearance: 17.3 mL/min (A) (by C-G formula based on SCr of 3.94 mg/dL (H)).   Medical History: Past Medical History:  Diagnosis Date  . AICD (automatic cardioverter/defibrillator) present   . Asthma   . AVM (arteriovenous malformation) of colon 07/07/2017  . CHF (congestive heart failure) ([place])   . Diabetes ([place])   . ESRF (end stage renal failure) ([place]) 07/07/2017   pt receiving hemodialysis Monday- Wednesday-friday, sometimes Saturday  . Kidney disease   . LVAD (left ventricular assist device) present ([place]) 12/2016  . Permanent atrial fibrillation ([place]) 07/07/2017  . Presence of permanent cardiac pacemaker    AICD  . Sleep apnea      Assessment: 71yom with HF Hx LVAD HM2 presented to ED with SOB, feels bad.   INR on admit was supratherapeutic on 2.93 - dose was held 4/22. INR 1.75 s/p holding for GI procedures s/p EGD and colonoscopy 4/26 and capsule study - shows more bleeding unreachable by scope.   Received two units ofPRBCs on 4/22 another unit PRBC 4/27 For low h/h on going GIB  LDH stable at 201. Has hx of ESRD. Warfarin restarted last night. INR goal in hospital will be 1.8-2, once discharged will aim for 1.8-2.3. INR currently is 1.5. Discussed with cardiology and will hold off on any IV heparin at this time and continue coumadin. Hgb continues to drift  down 10>9.2>8.5.   PTA warfarin dose 10mg  Tu/TH and 7.5mg  all other days   Has hx of colonic AVMs on colonoscopy on 4/18 admission.   Goal of Therapy:  INR Decrease 1.8-2 while in hospital then 1.8-2.3 as outpatient Monitor platelets by anticoagulation protocol: Yes   Plan:  Warfarin 7.5mg  tonight Daily INR Continue octreotide tid and doxy  Erin Hearing PharmD., BCPS Clinical Pharmacist 03/02/2018 8:21 AM

## 2018-03-02 NOTE — Progress Notes (Addendum)
[facility] Progress Note  Subjective: no new c/o, no SOB or orthopena  Vitals:   03/02/18 0352 03/02/18 0811 03/02/18 0823 03/02/18 0909  BP:  (!) 93/44 (!) 111/95   Pulse:  80    Resp:  18    Temp:  98.1 F (36.7 C)    TempSrc:  Oral    SpO2:  99%  99%  Weight: 88.5 kg (195 lb)     Height:        Inpatient medications: . atorvastatin  40 mg Oral q1800  . budesonide  0.25 mg Inhalation BID  . busPIRone  5 mg Oral BID  . calcitRIOL  0.5 mcg Oral Q M,W,F-HD  . citalopram  20 mg Oral Daily  . docusate sodium  100 mg Oral Daily  . doxycycline  100 mg Oral Q12H  . gabapentin  300 mg Oral BID  . insulin aspart  0-5 Units Subcutaneous QHS  . insulin aspart  0-9 Units Subcutaneous TID WC  . insulin glargine  15 Units Subcutaneous QHS  . levothyroxine  25 mcg Oral QAC breakfast  . magnesium oxide  400 mg Oral Daily  . multivitamin  1 tablet Oral QHS  . octreotide  50 mcg Subcutaneous TID  . pantoprazole  40 mg Oral Daily  . polyethylene glycol  17 g Oral Once  . sevelamer carbonate  3,200 mg Oral TID WC  . sildenafil  40 mg Oral TID  . warfarin  7.5 mg Oral ONCE-1800  . Warfarin - Pharmacist Dosing Inpatient   Does not apply q1800   . sodium chloride    . sodium chloride    . sodium chloride    . sodium chloride    . sodium chloride     sodium chloride, sodium chloride, sodium chloride, sodium chloride, acetaminophen, albuterol, alteplase, heparin, heparin, lidocaine (PF), lidocaine (PF), lidocaine-prilocaine, lidocaine-prilocaine, ondansetron (ZOFRAN) IV, pentafluoroprop-tetrafluoroeth, pentafluoroprop-tetrafluoroeth, traMADol, traZODone  Exam: Alert, no distress NO jvd Chest occ scattered basilar rales Cor LVAD noise Abd soft ntnd obese Ext 1+ pitting edema NF, ox 3 L AVF+bruit  Dialysis: [facility]at  4.5h 180NRe 400/800 EDW 83kg 2K/2Ca  L AVF Hep 5800 Venofer 100mg  IV x 5 (4/5 dosed) Mircera 62mcg IV q 2 weeks (last 4/17)  Calcitriol 0.29mcg PO  TIW  Renvela 4 tabs tid qac, 2 bid q snacks  CXR 4/22 - clear CXR 4/27 - mild vasc congestion      Impression: 1  Anemia/ GIB - felt due to AVM's on doxy/ octreotide, sp 4u prbc 2  ESRD - on HD MWFSat, have d/w dr deterding, this is due to excessive wt gains (up to 4-5kg between HD sessions) which did not respond to counseling the patient about volume restriction; also lvad patients typically cannot UF large volumes on a specific HD session (more than 3kg e.g.) due to hypotension issues 3  Volume - +volume up, HD today max UF 3- 3.5 L 4  CM sp LVAD/ ICD 5  CAD hx CABG/ afib - on coumadin 6  Anemia of CKD - esa due on 5/1 Wed, give darbe 60 ug  Plan - HD today   Kelly Splinter MD [facility] pager [phone]   03/02/2018, 10:21 AM   Recent Labs  Lab 02/28/18 0218 03/01/18 0230 03/02/18 0448  NA 131* 133* 132*  K 4.1 3.7 3.9  CL 93* 96* 97*  CO2 29 30 29   GLUCOSE 235* 212* 105*  BUN 21* 14 28*  CREATININE 3.79*  2.88* 3.94*  CALCIUM 8.4* 8.2* 8.2*  PHOS 3.0 2.9 3.1   Recent Labs  Lab 02/28/18 0218 03/01/18 0230 03/02/18 0448  ALBUMIN 3.1* 3.0* 3.2*   Recent Labs  Lab 02/28/18 0218 02/28/18 1923 03/01/18 0230 03/02/18 0448  WBC 8.1  --  9.6 8.6  HGB 7.6* 10.0* 9.2* 8.5*  HCT 23.3* 29.7* 27.8* 25.9*  MCV 94.3  --  92.4 93.5  PLT 122*  --  104* 87*   Iron/TIBC/Ferritin/ %Sat    Component Value Date/Time   IRON 125 06/14/2017 1605   TIBC 221 (L) 06/14/2017 1605   FERRITIN 622 (H) 06/14/2017 1605   IRONPCTSAT 57 (H) 06/14/2017 1605

## 2018-03-03 ENCOUNTER — Other Ambulatory Visit (HOSPITAL_COMMUNITY): Payer: Self-pay | Admitting: Pharmacist

## 2018-03-03 ENCOUNTER — Ambulatory Visit: Payer: Medicare Other | Admitting: Internal Medicine

## 2018-03-03 DIAGNOSIS — Q2730 Arteriovenous malformation, site unspecified: Secondary | ICD-10-CM

## 2018-03-03 DIAGNOSIS — K922 Gastrointestinal hemorrhage, unspecified: Secondary | ICD-10-CM

## 2018-03-03 LAB — LACTATE DEHYDROGENASE: LDH: 228 U/L — AB (ref 98–192)

## 2018-03-03 LAB — RENAL FUNCTION PANEL
ALBUMIN: 3.1 g/dL — AB (ref 3.5–5.0)
Anion gap: 8 (ref 5–15)
BUN: 16 mg/dL (ref 6–20)
CO2: 29 mmol/L (ref 22–32)
CREATININE: 2.76 mg/dL — AB (ref 0.61–1.24)
Calcium: 8.4 mg/dL — ABNORMAL LOW (ref 8.9–10.3)
Chloride: 98 mmol/L — ABNORMAL LOW (ref 101–111)
GFR calc Af Amer: 25 mL/min — ABNORMAL LOW (ref >60)
GFR, EST NON AFRICAN AMERICAN: 21 mL/min — AB (ref >60)
Glucose, Bld: 204 mg/dL — ABNORMAL HIGH (ref 65–99)
PHOSPHORUS: 1.9 mg/dL — AB (ref 2.5–4.6)
POTASSIUM: 4.3 mmol/L (ref 3.5–5.1)
Sodium: 135 mmol/L (ref 135–145)

## 2018-03-03 LAB — GLUCOSE, CAPILLARY
GLUCOSE-CAPILLARY: 108 mg/dL — AB (ref 65–99)
Glucose-Capillary: 159 mg/dL — ABNORMAL HIGH (ref 65–99)
Glucose-Capillary: 158 mg/dL — ABNORMAL HIGH (ref 65–99)

## 2018-03-03 LAB — CBC
HEMATOCRIT: 25.2 % — AB (ref 39.0–52.0)
Hemoglobin: 8.1 g/dL — ABNORMAL LOW (ref 13.0–17.0)
MCH: 30.2 pg (ref 26.0–34.0)
MCHC: 32.1 g/dL (ref 30.0–36.0)
MCV: 94 fL (ref 78.0–100.0)
Platelets: 97 10*3/uL — ABNORMAL LOW (ref 150–400)
RBC: 2.68 MIL/uL — ABNORMAL LOW (ref 4.22–5.81)
RDW: 18.5 % — AB (ref 11.5–15.5)
WBC: 8.6 10*3/uL (ref 4.0–10.5)

## 2018-03-03 LAB — PROTIME-INR
INR: 1.43
PROTHROMBIN TIME: 17.3 s — AB (ref 11.4–15.2)

## 2018-03-03 MED ORDER — WARFARIN SODIUM 7.5 MG PO TABS
7.5000 mg | ORAL_TABLET | Freq: Once | ORAL | Status: AC
  Administered 2018-03-03: 7.5 mg via ORAL
  Filled 2018-03-03: qty 1

## 2018-03-03 NOTE — Progress Notes (Signed)
ANTICOAGULATION CONSULT NOTE  Pharmacy Consult for warfarin Indication: LVAD  No Known Allergies  Patient Measurements: Height: 5\' 5"  (165.1 cm) Weight: 189 lb 2.5 oz (85.8 kg) IBW/kg (Calculated) : 61.5   Vital Signs: Temp: 98.3 F (36.8 C) (04/30 0717) Temp Source: Oral (04/30 0717) BP: 112/83 (04/30 0717) Pulse Rate: 71 (04/30 0717)  Labs: Recent Labs    03/01/18 0230 03/02/18 0448 03/03/18 0236  HGB 9.2* 8.5* 8.1*  HCT 27.8* 25.9* 25.2*  PLT 104* 87* 97*  LABPROT 20.3* 18.0* 17.3*  INR 1.75 1.50 1.43  CREATININE 2.88* 3.94* 2.76*    Estimated Creatinine Clearance: 24.4 mL/min (A) (by C-G formula based on SCr of 2.76 mg/dL (H)).   Medical History: Past Medical History:  Diagnosis Date  . AICD (automatic cardioverter/defibrillator) present   . Asthma   . AVM (arteriovenous malformation) of colon 07/07/2017  . CHF (congestive heart failure) ([place])   . Diabetes ([place])   . ESRF (end stage renal failure) ([place]) 07/07/2017   pt receiving hemodialysis Monday- Wednesday-friday, sometimes Saturday  . Kidney disease   . LVAD (left ventricular assist device) present ([place]) 12/2016  . Permanent atrial fibrillation ([place]) 07/07/2017  . Presence of permanent cardiac pacemaker    AICD  . Sleep apnea      Assessment: 71yom with HF Hx LVAD HM2 presented to ED with SOB, feels bad.   INR on admit was supratherapeutic on 2.93 - dose was held 4/22. INR 1.75 s/p holding for GI procedures s/p EGD and colonoscopy 4/26 and capsule study - shows more bleeding unreachable by scope.   Received two units ofPRBCs on 4/22 another unit PRBC 4/27 For low h/h on going GIB  LDH stable at 228. Has hx of ESRD. Warfarin restarted 4/28. INR goal in hospital will be 1.8-2, once discharged will aim for 1.8-2.3. INR currently is 1.4. Discussed with cardiology and will hold off on any IV heparin at this time and continue coumadin. Hgb continues to drift down 10>9.2>8.5>8.1.  PTA warfarin dose 10mg   Tu/TH and 7.5mg  all other days   Has hx of colonic AVMs on colonoscopy on 4/18 admission.   Goal of Therapy:  INR Decrease 1.8-2 while in hospital then 1.8-2.3 as outpatient Monitor platelets by anticoagulation protocol: Yes   Plan:  Warfarin 7.5mg  again tonight Daily INR Continue octreotide tid and doxy  Erin Hearing PharmD., BCPS Clinical Pharmacist 03/03/2018 8:03 AM

## 2018-03-03 NOTE — Progress Notes (Signed)
[facility] Progress Note  Subjective: no new c/o, no SOB or orthopena  Vitals:   03/02/18 2354 03/03/18 0252 03/03/18 0545 03/03/18 0717  BP: (!) 77/62 (!) 89/72  112/83  Pulse: 76 60  71  Resp: 20 10  15   Temp: 98.3 F (36.8 C) 98.5 F (36.9 C)  98.3 F (36.8 C)  TempSrc: Oral Oral  Oral  SpO2: 100% 100%  96%  Weight:   85.8 kg (189 lb 2.5 oz)   Height:        Inpatient medications: . atorvastatin  40 mg Oral q1800  . budesonide  0.25 mg Inhalation BID  . busPIRone  5 mg Oral BID  . calcitRIOL  0.5 mcg Oral Q M,W,F-HD  . citalopram  20 mg Oral Daily  . [START ON 03/04/2018] darbepoetin (ARANESP) injection - DIALYSIS  60 mcg Intravenous Q Wed-HD  . docusate sodium  100 mg Oral Daily  . doxycycline  100 mg Oral Q12H  . gabapentin  300 mg Oral BID  . insulin aspart  0-5 Units Subcutaneous QHS  . insulin aspart  0-9 Units Subcutaneous TID WC  . insulin glargine  15 Units Subcutaneous QHS  . levothyroxine  25 mcg Oral QAC breakfast  . magnesium oxide  400 mg Oral Daily  . multivitamin  1 tablet Oral QHS  . octreotide  50 mcg Subcutaneous TID  . pantoprazole  40 mg Oral Daily  . polyethylene glycol  17 g Oral Once  . sevelamer carbonate  3,200 mg Oral TID WC  . sildenafil  40 mg Oral TID  . warfarin  7.5 mg Oral ONCE-1800  . Warfarin - Pharmacist Dosing Inpatient   Does not apply q1800   . sodium chloride    . sodium chloride    . sodium chloride     sodium chloride, sodium chloride, acetaminophen, albuterol, alteplase, heparin, lidocaine (PF), lidocaine-prilocaine, ondansetron (ZOFRAN) IV, pentafluoroprop-tetrafluoroeth, traMADol, traZODone  Exam: Alert, no distress NO jvd Chest occ scattered basilar rales Cor LVAD noise Abd soft ntnd obese Ext 1+ pitting edema NF, ox 3 L AVF+bruit  Dialysis: [facility]  4.5h 180NRe 400/800 EDW 83kg 2K/2Ca  L AVF Hep 5800 Venofer 100mg  IV x 5 (4/5 dosed) Mircera 39mcg IV q 2 weeks (last 4/17)  Calcitriol  0.62mcg PO TIW  Renvela 4 tabs tid qac, 2 bid q snacks  CXR 4/22 - clear CXR 4/27 - mild vasc congestion      Impression: 1  Anemia/ GIB - felt due to AVM's on doxy/ octreotide, sp 4u prbc 2  ESRD - on HD MWFSat, on 4 x/ week due to high fluid weight gains which has not improved w/ counseling. Spoke w/ pt at length today about fluid issue but not sure had much effect.  Have d/w cardiology, will investigate pd option further.  3  Volume - 3.5 L off yest w/o problems, still up 2-3kg 4  CM sp LVAD/ ICD 5  CAD hx CABG/ afib - on coumadin 6  Anemia of CKD - esa due on 5/1 Wed, give darbe 60 ug  Plan - HD tomorrow   Kelly Splinter MD [place] pager [phone]   03/03/2018, 11:02 AM   Recent Labs  Lab 03/01/18 0230 03/02/18 0448 03/03/18 0236  NA 133* 132* 135  K 3.7 3.9 4.3  CL 96* 97* 98*  CO2 30 29 29   GLUCOSE 212* 105* 204*  BUN 14 28* 16  CREATININE 2.88* 3.94* 2.76*  CALCIUM 8.2*  8.2* 8.4*  PHOS 2.9 3.1 1.9*   Recent Labs  Lab 03/01/18 0230 03/02/18 0448 03/03/18 0236  ALBUMIN 3.0* 3.2* 3.1*   Recent Labs  Lab 03/01/18 0230 03/02/18 0448 03/03/18 0236  WBC 9.6 8.6 8.6  HGB 9.2* 8.5* 8.1*  HCT 27.8* 25.9* 25.2*  MCV 92.4 93.5 94.0  PLT 104* 87* 97*   Iron/TIBC/Ferritin/ %Sat    Component Value Date/Time   IRON 125 06/14/2017 1605   TIBC 221 (L) 06/14/2017 1605   FERRITIN 622 (H) 06/14/2017 1605   IRONPCTSAT 57 (H) 06/14/2017 1605

## 2018-03-03 NOTE — Progress Notes (Addendum)
Patient ID: Trevor [name], male   DOB: June 23, 1946, 72 y.o.   MRN: [id number]   Advanced Heart Failure VAD Team Note  PCP-Cardiologist: No primary care provider on file.   Subjective:    Events: - 4/25 2u RBCs - 4/26 Colon/EGD - Colonoscopy with red blood and coffee-ground hematin was found in the entire colon (cecum to rectum). No source found for active bleeding despite copious irrigation and lavage and careful inspection.  - 4/26 capsule endo - Capsule endoscopy showed active bleeding in the ileum; source not found.  Given clinical history and LVAD status this is likely related to active bleeding ileal angioectasias/angiodysplasias. - 4/27 2u RBCs  He is on [place] octreotide and doxycycline currently, no overt bleeding over the last day but no BM.  Breathing ok today, had HD yesterday.   INR 1.4, not covering with heparin.  Hgb down to 8.1.   LVAD Interrogation HM 2: Speed: 9600 Flow: 5.7 PI: 4.8 Power: 6.4, 4 PI events.   Objective:    Vital Signs:   Temp:  [97.5 F (36.4 C)-98.5 F (36.9 C)] 98.5 F (36.9 C) (04/30 0252) Pulse Rate:  [25-82] 71 (04/30 0717) Resp:  [10-21] 15 (04/30 0717) BP: (77-115)/(44-95) 89/72 (04/30 0252) SpO2:  [96 %-100 %] 96 % (04/30 0717) Weight:  [188 lb 7.9 oz (85.5 kg)-189 lb 2.5 oz (85.8 kg)] 189 lb 2.5 oz (85.8 kg) (04/30 0545) Last BM Date: 02/27/18   Mean arterial Pressure 79  Intake/Output:   Intake/Output Summary (Last 24 hours) at 03/03/2018 0747 Last data filed at 03/03/2018 0546 Gross per 24 hour  Intake 900 ml  Output 4050 ml  Net -3150 ml     Physical Exam    GENERAL: Well appearing this am. NAD.  HEENT: Normal. NECK: Supple, JVP 8 cm. Carotids OK.  CARDIAC:  Mechanical heart sounds with LVAD hum present.  LUNGS:  CTAB, normal effort.  ABDOMEN:  NT, ND, no HSM. No bruits or masses. +BS  LVAD exit site: Well-healed and incorporated. Dressing dry and intact. No erythema or drainage. Stabilization device present and accurately  applied. Driveline dressing changed daily per sterile technique. EXTREMITIES:  Warm and dry. No cyanosis, clubbing, rash, or edema.  NEUROLOGIC:  Alert & oriented x 3. Cranial nerves grossly intact. Moves all 4 extremities w/o difficulty. Affect pleasant    Telemetry   Afib 70s, personally reviewed.   EKG    No new tracings.    Labs   Basic Metabolic Panel: Recent Labs  Lab 02/26/18 0204 02/27/18 0250 02/28/18 0218 03/01/18 0230 03/02/18 0448 03/03/18 0236  NA 130* 133* 131* 133* 132* 135  K 3.8 3.9 4.1 3.7 3.9 4.3  CL 93* 95* 93* 96* 97* 98*  CO2 25 29 29 30 29 29   GLUCOSE 175* 129* 235* 212* 105* 204*  BUN 48* 20 21* 14 28* 16  CREATININE 4.76* 2.99* 3.79* 2.88* 3.94* 2.76*  CALCIUM 8.2* 7.9* 8.4* 8.2* 8.2* 8.4*  PHOS 4.3  --  3.0 2.9 3.1 1.9*    Liver Function Tests: Recent Labs  Lab 02/26/18 0204 02/28/18 0218 03/01/18 0230 03/02/18 0448 03/03/18 0236  ALBUMIN 3.4* 3.1* 3.0* 3.2* 3.1*   No results for input(s): LIPASE, AMYLASE in the last 168 hours. No results for input(s): AMMONIA in the last 168 hours.  CBC: Recent Labs  Lab 02/27/18 0250 02/28/18 0218 02/28/18 1923 03/01/18 0230 03/02/18 0448 03/03/18 0236  WBC 11.0* 8.1  --  9.6 8.6 8.6  HGB 8.2* 7.6*  10.0* 9.2* 8.5* 8.1*  HCT 24.9* 23.3* 29.7* 27.8* 25.9* 25.2*  MCV 94.7 94.3  --  92.4 93.5 94.0  PLT 144* 122*  --  104* 87* 97*    INR: Recent Labs  Lab 02/28/18 0218 02/28/18 1923 03/01/18 0230 03/02/18 0448 03/03/18 0236  INR 2.46 1.82 1.75 1.50 1.43    Other results:    Imaging   No results found.   Medications:     Scheduled Medications: . atorvastatin  40 mg Oral q1800  . budesonide  0.25 mg Inhalation BID  . busPIRone  5 mg Oral BID  . calcitRIOL  0.5 mcg Oral Q M,W,F-HD  . citalopram  20 mg Oral Daily  . [START ON 03/04/2018] darbepoetin (ARANESP) injection - DIALYSIS  60 mcg Intravenous Q Wed-HD  . docusate sodium  100 mg Oral Daily  . doxycycline  100 mg Oral  Q12H  . gabapentin  300 mg Oral BID  . insulin aspart  0-5 Units Subcutaneous QHS  . insulin aspart  0-9 Units Subcutaneous TID WC  . insulin glargine  15 Units Subcutaneous QHS  . levothyroxine  25 mcg Oral QAC breakfast  . magnesium oxide  400 mg Oral Daily  . multivitamin  1 tablet Oral QHS  . octreotide  50 mcg Subcutaneous TID  . pantoprazole  40 mg Oral Daily  . polyethylene glycol  17 g Oral Once  . sevelamer carbonate  3,200 mg Oral TID WC  . sildenafil  40 mg Oral TID  . Warfarin - Pharmacist Dosing Inpatient   Does not apply q1800    Infusions: . sodium chloride    . sodium chloride    . sodium chloride    . sodium chloride    . sodium chloride      PRN Medications: sodium chloride, sodium chloride, sodium chloride, sodium chloride, acetaminophen, albuterol, alteplase, heparin, heparin, lidocaine (PF), lidocaine (PF), lidocaine-prilocaine, lidocaine-prilocaine, ondansetron (ZOFRAN) IV, pentafluoroprop-tetrafluoroeth, pentafluoroprop-tetrafluoroeth, traMADol, traZODone   Patient Profile   Trevor [name] is a 72 y.o. male with a history of CAD s/p CABG x 4 2010, OSA, AS with TAVR 2015, , ESRD on HD, DM2, chronic atrial fibrillation, asthma, and ischemic cardiomyopathy with Medtronic ICD.  S/p HMII LVAD for Destination therapy on 12/14/2015.   Presented to [facility] 02/23/18 with worsening SOB and malaise s/p HD.   Assessment/Plan:    1. 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 Underwent Colon/EGD 02/27/18 with normal EGD. Colonoscopy with red blood and coffee-ground heme was found in the entire colon (cecum to rectum). No source found for active bleeding despite copious irrigation and lavage and careful inspection.  Capsule Endoscopy 02/27/18: Showed active bleeding in the ileum; source not found.  Given clinical history and LVAD status this is likely related to active bleeding ileal angioectasias/angiodysplasias. Felt not to be reachable by double  balloon at [facility] per Dr. Hilarie Fredrickson.  Hgb down again to 8.1 today, no overt bleeding.  - Continue octreotide injections [place] acutely. - He has been started on doxycycline 100 bid to try to limit AVM bleeding (and for URI sx).   - Off ASA - INR 1.4. Goal INR will be 1.8-2.3 at this point, aim for 1.8-2 in hospital currently. Will not cover with heparin gtt at this time, allowing INR to slowly drift up.  - Transfuse hgb < 8.  Had suspected histamine reaction due to RBC transfusion. Will give benadryl with further transfusions.  2. Acute bronchitis/URI: Covering with course  of doxy. Now improved.  3. Acute on chronic systolic CHF: Ischemic cardiomyopathy, EF 20-25% with RV dysfunction on 2/18 echo pre-LVAD. 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 Medtronic ICD. He has had problems with RV failure post-op. He developed worsening renal dysfunction and finally had to start HD in 8/18. There has been some difficulty getting adequate fluid off him, currently dialyzing 4 days/week.  - VAD interrogated personally. Parameters stable. 4. CAD: s/p CABG. No chest pain.   - Off ASA with bleeding - He is on atorvastatin.  5. ESRD: Renal following AVF has matured and being used.  He tolerates HD poorly, has very poor QOL on days of HD.  We discussed possibility of PD with nephrology and surgery, but concensus appears to be that it would be very difficult to place PD catheter.   - I requested from nephrology that we try to find a way to decrease HD to three days a week for QOL, allowing higher dry weight as he seems to tolerate this without significant dyspnea.  If this is not an option, would query if nephrology would be open to PD, and will discuss catheter placement again with surgery => I discussed PD catheter again with Dr. Rosendo Gros of [facility], it sounds like they would not put it in here due to risk of adhesions and failure of catheter.  6. Atrial fibrillation: Chronic. Rate controlled.  7. RV failure:  - Continue revatio 40 mg  TID.  - No change to current plan.   8. HTN: MAP stable 70-80s 9. 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  - Holding off on radiation. He does not think he can handle radiation as well as HD 4 days/week.  - No change to current plan.   10. H/o TAVR:  - Stable on recent Echo.   Will continue to watch in hospital with hgb trending down.   I reviewed the LVAD parameters from today, and compared the results to the patient's prior recorded data.  No programming changes were made.  The LVAD is functioning within specified parameters.  The patient performs LVAD self-test daily.  LVAD interrogation was negative for any significant power changes, alarms or PI events/speed drops.  LVAD equipment check completed and is in good working order.  Back-up equipment present.   LVAD education done on emergency procedures and precautions and reviewed exit site care.   Length of Stay: 8  Loralie Champagne, MD 03/03/2018, 7:47 AM  VAD Team --- VAD ISSUES ONLY--- Pager [phone] (7am - 7am)  Advanced Heart Failure Team  Pager [phone] (M-F; 7a - 4p)  Please contact [facility] for night-coverage after hours (4p -7a ) and weekends on [email or web address]

## 2018-03-03 NOTE — Progress Notes (Signed)
LVAD Coordinator Rounding Note:  Admitted 02/23/18 per [name] due to symptomatic anemia.   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 Darcey Nora under DT criteria due to age excluding heart transplant.     Vital signs: Temp: 98.3 HR: 71 Doppler Pressure: 79 Automatic BP:  112/83 (102) O2 Sat: 96% on RA Wt: 180>180>185>188>183>188>188>195>189 lbs  LVAD interrogation reveals:  Speed: 9600 Flow: 6.4 Power:  6.8 PI: 4.4 Alarms: none Events:  5 PI events Fixed speed: 9600 Low speed limit: 9000  Drive Line:  Existing VAD dressing removed and site care performed using sterile technique. Drive line exit site cleaned with Chlora prep applicators x 2 rinsed with saline and allowed to dry, Sorbaview dressing re-applied. Exit site healed and incorporated, the velour is fully implanted at exit site. No redness, tenderness, drainage, foul odor or rash noted. Drive line anchor re-applied. Next dressing change due 5/7.  Labs:  LDH trend: 257>232>224>235>217>201>228  INR trend: 2.93> 2.61>2.17>2.05>2.31>1.50>1.43  Hgb trend: 7.7>8.1>8.9>9.1>8.4>8.2>8.5>8.1  Anticoagulation Plan: -INR Goal:  2.0 - 2.5 -ASA Dose: no ASA due to hx of GI bleed  Blood Products:  -02/23/18>2 units PCs   Device: - Medtronic single lead -Therapies: on at 231 bpm  Adverse Events on VAD: 02/2017> GIB- AVM clipped x2 07/2017> renal failure- HD started 11/2017>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 Margins not clear at neck.   Plan/Recommendations:   1. Weekly dressing change performed this morning. 2. Please call VAD pager if any VAD equipment or drive line issues.  [name], VAD Coordinator 24/7 VAD pager: [phone]

## 2018-03-04 ENCOUNTER — Telehealth (HOSPITAL_COMMUNITY): Payer: Self-pay | Admitting: Pharmacist

## 2018-03-04 LAB — CBC
HEMATOCRIT: 26 % — AB (ref 39.0–52.0)
Hemoglobin: 8.3 g/dL — ABNORMAL LOW (ref 13.0–17.0)
MCH: 30.3 pg (ref 26.0–34.0)
MCHC: 31.9 g/dL (ref 30.0–36.0)
MCV: 94.9 fL (ref 78.0–100.0)
PLATELETS: 104 10*3/uL — AB (ref 150–400)
RBC: 2.74 MIL/uL — ABNORMAL LOW (ref 4.22–5.81)
RDW: 18.5 % — AB (ref 11.5–15.5)
WBC: 9.1 10*3/uL (ref 4.0–10.5)

## 2018-03-04 LAB — PROTIME-INR
INR: 1.66
PROTHROMBIN TIME: 19.5 s — AB (ref 11.4–15.2)

## 2018-03-04 LAB — RENAL FUNCTION PANEL
Albumin: 3.2 g/dL — ABNORMAL LOW (ref 3.5–5.0)
Anion gap: 7 (ref 5–15)
BUN: 29 mg/dL — AB (ref 6–20)
CO2: 30 mmol/L (ref 22–32)
CREATININE: 3.52 mg/dL — AB (ref 0.61–1.24)
Calcium: 8.6 mg/dL — ABNORMAL LOW (ref 8.9–10.3)
Chloride: 99 mmol/L — ABNORMAL LOW (ref 101–111)
GFR calc Af Amer: 19 mL/min — ABNORMAL LOW (ref >60)
GFR, EST NON AFRICAN AMERICAN: 16 mL/min — AB (ref >60)
GLUCOSE: 114 mg/dL — AB (ref 65–99)
POTASSIUM: 4.3 mmol/L (ref 3.5–5.1)
Phosphorus: 2.4 mg/dL — ABNORMAL LOW (ref 2.5–4.6)
Sodium: 136 mmol/L (ref 135–145)

## 2018-03-04 LAB — GLUCOSE, CAPILLARY
GLUCOSE-CAPILLARY: 143 mg/dL — AB (ref 65–99)
Glucose-Capillary: 61 mg/dL — ABNORMAL LOW (ref 65–99)
Glucose-Capillary: 96 mg/dL (ref 65–99)
Glucose-Capillary: 185 mg/dL — ABNORMAL HIGH (ref 65–99)

## 2018-03-04 LAB — LACTATE DEHYDROGENASE: LDH: 221 U/L — AB (ref 98–192)

## 2018-03-04 MED ORDER — DM-GUAIFENESIN ER 30-600 MG PO TB12
1.0000 | ORAL_TABLET | Freq: Two times a day (BID) | ORAL | Status: DC | PRN
  Administered 2018-03-04 (×2): 1 via ORAL
  Filled 2018-03-04 (×2): qty 1

## 2018-03-04 MED ORDER — WARFARIN SODIUM 5 MG PO TABS
5.0000 mg | ORAL_TABLET | Freq: Once | ORAL | Status: AC
  Administered 2018-03-04: 5 mg via ORAL
  Filled 2018-03-04: qty 1

## 2018-03-04 MED ORDER — CALCITRIOL 0.5 MCG PO CAPS
ORAL_CAPSULE | ORAL | Status: AC
  Filled 2018-03-04: qty 1

## 2018-03-04 MED ORDER — DARBEPOETIN ALFA 60 MCG/0.3ML IJ SOSY
PREFILLED_SYRINGE | INTRAMUSCULAR | Status: AC
  Filled 2018-03-04: qty 0.3

## 2018-03-04 MED ORDER — HEPARIN SODIUM (PORCINE) 1000 UNIT/ML DIALYSIS
5800.0000 [IU] | Freq: Once | INTRAMUSCULAR | Status: DC
  Filled 2018-03-04: qty 6

## 2018-03-04 NOTE — Progress Notes (Signed)
[facility] Progress Note  Subjective: no c/o, excited about lowering his fluid intake  Vitals:   03/03/18 2342 03/04/18 0600 03/04/18 0711 03/04/18 0745  BP: 107/77   106/90  Pulse: 88  67 90  Resp: 16  16 14   Temp: 98.3 F (36.8 C)   98.1 F (36.7 C)  TempSrc: Oral   Oral  SpO2:   98% 97%  Weight:  86.4 kg (190 lb 8 oz)    Height:        Inpatient medications: . atorvastatin  40 mg Oral q1800  . budesonide  0.25 mg Inhalation BID  . busPIRone  5 mg Oral BID  . calcitRIOL  0.5 mcg Oral Q M,W,F-HD  . citalopram  20 mg Oral Daily  . darbepoetin (ARANESP) injection - DIALYSIS  60 mcg Intravenous Q Wed-HD  . docusate sodium  100 mg Oral Daily  . doxycycline  100 mg Oral Q12H  . gabapentin  300 mg Oral BID  . insulin aspart  0-5 Units Subcutaneous QHS  . insulin aspart  0-9 Units Subcutaneous TID WC  . insulin glargine  15 Units Subcutaneous QHS  . levothyroxine  25 mcg Oral QAC breakfast  . magnesium oxide  400 mg Oral Daily  . multivitamin  1 tablet Oral QHS  . octreotide  50 mcg Subcutaneous TID  . pantoprazole  40 mg Oral Daily  . polyethylene glycol  17 g Oral Once  . sevelamer carbonate  3,200 mg Oral TID WC  . sildenafil  40 mg Oral TID  . warfarin  5 mg Oral ONCE-1800  . Warfarin - Pharmacist Dosing Inpatient   Does not apply q1800   . sodium chloride    . sodium chloride    . sodium chloride     sodium chloride, sodium chloride, acetaminophen, albuterol, alteplase, dextromethorphan-guaiFENesin, heparin, lidocaine (PF), lidocaine-prilocaine, ondansetron (ZOFRAN) IV, pentafluoroprop-tetrafluoroeth, traMADol, traZODone  Exam: Alert, no distress NO jvd Chest occ scattered basilar rales Cor LVAD noise Abd soft ntnd obese Ext 1+ pitting edema NF, ox 3 L AVF+bruit  Dialysis: [facility] MWFSat  4.5h 180NRe 400/800 EDW 83kg 2K/2Ca  L AVF Hep 5800 Venofer 100mg  IV x 5 (4/5 dosed) Mircera 23mcg IV q 2 weeks (last 4/17)  Calcitriol 0.51mcg PO TIW   Renvela 4 tabs tid qac, 2 bid q snacks  CXR 4/22 - clear CXR 4/27 - mild vasc congestion      Impression: 1  Anemia/ GIB - felt due to AVM's on doxy/ octreotide, sp 4u prbc 2  ESRD - on HD MWFSat, on hd 4 times per wk due to excessive fluid intake have d/w pt 3  Volume - up 3.5kg today 4  CM sp LVAD/ ICD 5  CAD hx CABG/ afib - on coumadin 6  Anemia of CKD - esa due on 5/1 Wed, give darbe 60 ug  Plan - HD today uf to dry wt   Kelly Splinter MD [facility] pager [phone]   03/04/2018, 11:43 AM   Recent Labs  Lab 03/02/18 0448 03/03/18 0236 03/04/18 0203  NA 132* 135 136  K 3.9 4.3 4.3  CL 97* 98* 99*  CO2 29 29 30   GLUCOSE 105* 204* 114*  BUN 28* 16 29*  CREATININE 3.94* 2.76* 3.52*  CALCIUM 8.2* 8.4* 8.6*  PHOS 3.1 1.9* 2.4*   Recent Labs  Lab 03/02/18 0448 03/03/18 0236 03/04/18 0203  ALBUMIN 3.2* 3.1* 3.2*   Recent Labs  Lab 03/02/18 0448 03/03/18 0236 03/04/18  0203  WBC 8.6 8.6 9.1  HGB 8.5* 8.1* 8.3*  HCT 25.9* 25.2* 26.0*  MCV 93.5 94.0 94.9  PLT 87* 97* 104*   Iron/TIBC/Ferritin/ %Sat    Component Value Date/Time   IRON 125 06/14/2017 1605   TIBC 221 (L) 06/14/2017 1605   FERRITIN 622 (H) 06/14/2017 1605   IRONPCTSAT 57 (H) 06/14/2017 1605

## 2018-03-04 NOTE — Plan of Care (Signed)

## 2018-03-04 NOTE — Telephone Encounter (Signed)
PA for danazol not required by [facility] Part D.   Ruta Hinds. Velva Harman, PharmD, BCPS, CPP Clinical Pharmacist Phone: [phone] 03/04/2018 11:56 AM

## 2018-03-04 NOTE — Progress Notes (Signed)
ANTICOAGULATION CONSULT NOTE  Pharmacy Consult for warfarin Indication: LVAD  No Known Allergies  Patient Measurements: Height: 5\' 5"  (165.1 cm) Weight: 190 lb 8 oz (86.4 kg) IBW/kg (Calculated) : 61.5   Vital Signs: Temp: 98.1 F (36.7 C) (05/01 0745) Temp Source: Oral (05/01 0745) BP: 106/90 (05/01 0745) Pulse Rate: 90 (05/01 0745)  Labs: Recent Labs    03/02/18 0448 03/03/18 0236 03/04/18 0203  HGB 8.5* 8.1* 8.3*  HCT 25.9* 25.2* 26.0*  PLT 87* 97* 104*  LABPROT 18.0* 17.3* 19.5*  INR 1.50 1.43 1.66  CREATININE 3.94* 2.76* 3.52*    Estimated Creatinine Clearance: 19.2 mL/min (A) (by C-G formula based on SCr of 3.52 mg/dL (H)).   Medical History: Past Medical History:  Diagnosis Date  . AICD (automatic cardioverter/defibrillator) present   . Asthma   . AVM (arteriovenous malformation) of colon 07/07/2017  . CHF (congestive heart failure) ([place])   . Diabetes ([place])   . ESRF (end stage renal failure) ([place]) 07/07/2017   pt receiving hemodialysis Monday- Wednesday-friday, sometimes Saturday  . Kidney disease   . LVAD (left ventricular assist device) present ([place]) 12/2016  . Permanent atrial fibrillation ([place]) 07/07/2017  . Presence of permanent cardiac pacemaker    AICD  . Sleep apnea      Assessment: 71yom with HF Hx LVAD HM2 presented to ED with SOB and feeling bad in setting of supratherapeutic INR of 2.93. Pt now s/p EGD and colonoscopy 4/26 with bleeding of unclear source and started on octreotide/doxycycline to control bleeding. INR subtherapeutic at 1.66 but rising appropriately. Holding off on heparin bridge after discussion with HF MD, Hgb stable today at 8.3, red streaks in stool per report.   PTA warfarin dose 10mg  Tu/TH and 7.5mg  all other days   Goal of Therapy:  INR Decrease 1.8-2 while in hospital then 1.8-2.3 as outpatient Monitor platelets by anticoagulation protocol: Yes   Plan:  Warfarin 5mg  PO x1 tonight Daily INR Continue octreotide tid  and doxy  Arrie Senate, PharmD, BCPS PGY-2 Cardiology Pharmacy Resident Pager: [phone] 03/04/2018

## 2018-03-04 NOTE — Progress Notes (Signed)
Inpatient Diabetes Program Recommendations  AACE/ADA: New Consensus Statement on Inpatient Glycemic Control (2019)  Target Ranges:  Prepandial:   less than 140 mg/dL      Peak postprandial:   less than 180 mg/dL (1-2 hours)      Critically ill patients:  140 - 180 mg/dL   Results for SHIRO, ELLERMAN (MRN [id number]) as of 03/04/2018 11:35  Ref. Range 03/03/2018 08:39 03/03/2018 12:02 03/03/2018 17:41 03/04/2018 04:52 03/04/2018 08:23  Glucose-Capillary Latest Ref Range: 65 - 99 mg/dL 108 (H) 159 (H) 158 (H) 61 (L) 96   Review of Glycemic Control  Current orders for Inpatient glycemic control: Lantus 15 units QHS, Novolog 0-9 units TID with meals, Novolog 0-5 units QHS  Inpatient Diabetes Program Recommendations: Insulin - Basal: Glucose of 61 mg at 4:52 am. Please consider decreasing Lantus to 13 units QHS.  Thanks, Barnie Alderman, RN, MSN, CDE Diabetes Coordinator Inpatient Diabetes Program [phone] (Team Pager from 8am to 5pm)

## 2018-03-04 NOTE — Progress Notes (Addendum)
Patient ID: Trevor [name], male   DOB: 11-03-1946, 72 y.o.   MRN: [id number]   Advanced Heart Failure VAD Team Note  PCP-Cardiologist: No primary care provider on file.   Subjective:    Events: - 4/25 2u RBCs - 4/26 Colon/EGD - Colonoscopy with red blood and coffee-ground hematin was found in the entire colon (cecum to rectum). No source found for active bleeding despite copious irrigation and lavage and careful inspection.  - 4/26 capsule endo - Capsule endoscopy showed active bleeding in the ileum; source not found.  Given clinical history and LVAD status this is likely related to active bleeding ileal angioectasias/angiodysplasias. - 4/27 2u RBCs  He is on [place] octreotide and doxycycline currently. Breathing ok today, MAP 80s.  He had 2 BMs yesterday.  Not black but had streaks of blood.  However, Hgb up to 8.3.   INR 1.7, not covering with heparin.   LVAD Interrogation HM 2: Speed: 9600 Flow: 6 PI: 4.7 Power: 6.6, 10 PI events/24 hrs.   Objective:    Vital Signs:   Temp:  [97.9 F (36.6 C)-98.3 F (36.8 C)] 98.1 F (36.7 C) (05/01 0745) Pulse Rate:  [61-96] 90 (05/01 0745) Resp:  [12-18] 14 (05/01 0745) BP: (90-107)/(64-90) 106/90 (05/01 0745) SpO2:  [94 %-98 %] 97 % (05/01 0745) Weight:  [190 lb 8 oz (86.4 kg)] 190 lb 8 oz (86.4 kg) (05/01 0600) Last BM Date: 03/03/18   Mean arterial Pressure 80s  Intake/Output:   Intake/Output Summary (Last 24 hours) at 03/04/2018 0807 Last data filed at 03/04/2018 0746 Gross per 24 hour  Intake 478 ml  Output 600 ml  Net -122 ml     Physical Exam    GENERAL: Well appearing this am. NAD.  HEENT: Normal. NECK: Supple, JVP 8 cm. Carotids OK.  CARDIAC:  Mechanical heart sounds with LVAD hum present.  LUNGS:  CTAB, normal effort.  ABDOMEN:  NT, ND, no HSM. No bruits or masses. +BS  LVAD exit site: Well-healed and incorporated. Dressing dry and intact. No erythema or drainage. Stabilization device present and accurately applied.  Driveline dressing changed daily per sterile technique. EXTREMITIES:  Warm and dry. No cyanosis, clubbing, rash.  1+ ankle edema.  NEUROLOGIC:  Alert & oriented x 3. Cranial nerves grossly intact. Moves all 4 extremities w/o difficulty. Affect pleasant      Telemetry   Afib 70s-80s, personally reviewed.   EKG    No new tracings.    Labs   Basic Metabolic Panel: Recent Labs  Lab 02/28/18 0218 03/01/18 0230 03/02/18 0448 03/03/18 0236 03/04/18 0203  NA 131* 133* 132* 135 136  K 4.1 3.7 3.9 4.3 4.3  CL 93* 96* 97* 98* 99*  CO2 29 30 29 29 30   GLUCOSE 235* 212* 105* 204* 114*  BUN 21* 14 28* 16 29*  CREATININE 3.79* 2.88* 3.94* 2.76* 3.52*  CALCIUM 8.4* 8.2* 8.2* 8.4* 8.6*  PHOS 3.0 2.9 3.1 1.9* 2.4*    Liver Function Tests: Recent Labs  Lab 02/28/18 0218 03/01/18 0230 03/02/18 0448 03/03/18 0236 03/04/18 0203  ALBUMIN 3.1* 3.0* 3.2* 3.1* 3.2*   No results for input(s): LIPASE, AMYLASE in the last 168 hours. No results for input(s): AMMONIA in the last 168 hours.  CBC: Recent Labs  Lab 02/28/18 0218 02/28/18 1923 03/01/18 0230 03/02/18 0448 03/03/18 0236 03/04/18 0203  WBC 8.1  --  9.6 8.6 8.6 9.1  HGB 7.6* 10.0* 9.2* 8.5* 8.1* 8.3*  HCT 23.3* 29.7* 27.8* 25.9*  25.2* 26.0*  MCV 94.3  --  92.4 93.5 94.0 94.9  PLT 122*  --  104* 87* 97* 104*    INR: Recent Labs  Lab 02/28/18 1923 03/01/18 0230 03/02/18 0448 03/03/18 0236 03/04/18 0203  INR 1.82 1.75 1.50 1.43 1.66    Other results:    Imaging   No results found.   Medications:     Scheduled Medications: . atorvastatin  40 mg Oral q1800  . budesonide  0.25 mg Inhalation BID  . busPIRone  5 mg Oral BID  . calcitRIOL  0.5 mcg Oral Q M,W,F-HD  . citalopram  20 mg Oral Daily  . darbepoetin (ARANESP) injection - DIALYSIS  60 mcg Intravenous Q Wed-HD  . docusate sodium  100 mg Oral Daily  . doxycycline  100 mg Oral Q12H  . gabapentin  300 mg Oral BID  . insulin aspart  0-5 Units  Subcutaneous QHS  . insulin aspart  0-9 Units Subcutaneous TID WC  . insulin glargine  15 Units Subcutaneous QHS  . levothyroxine  25 mcg Oral QAC breakfast  . magnesium oxide  400 mg Oral Daily  . multivitamin  1 tablet Oral QHS  . octreotide  50 mcg Subcutaneous TID  . pantoprazole  40 mg Oral Daily  . polyethylene glycol  17 g Oral Once  . sevelamer carbonate  3,200 mg Oral TID WC  . sildenafil  40 mg Oral TID  . Warfarin - Pharmacist Dosing Inpatient   Does not apply q1800    Infusions: . sodium chloride    . sodium chloride    . sodium chloride      PRN Medications: sodium chloride, sodium chloride, acetaminophen, albuterol, alteplase, dextromethorphan-guaiFENesin, heparin, lidocaine (PF), lidocaine-prilocaine, ondansetron (ZOFRAN) IV, pentafluoroprop-tetrafluoroeth, traMADol, traZODone   Patient Profile   Trevor [name] is a 72 y.o. male with a history of CAD s/p CABG x 4 2010, OSA, AS with TAVR 2015, , ESRD on HD, DM2, chronic atrial fibrillation, asthma, and ischemic cardiomyopathy with Medtronic ICD.  S/p HMII LVAD for Destination therapy on 12/14/2015.   Presented to [facility] 02/23/18 with worsening SOB and malaise s/p HD.   Assessment/Plan:    1. 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 Underwent Colon/EGD 02/27/18 with normal EGD. Colonoscopy with red blood and coffee-ground heme was found in the entire colon (cecum to rectum). No source found for active bleeding despite copious irrigation and lavage and careful inspection.  Capsule Endoscopy 02/27/18: Showed active bleeding in the ileum; source not found.  Given clinical history and LVAD status this is likely related to active bleeding ileal angioectasias/angiodysplasias. Felt not to be reachable by double balloon at [place] per Dr. Hilarie Fredrickson.  Hgb appears to have stabilized today, up to 8.3.  Still with bloody streak in BM but no melena.  - Continue octreotide injections [place] acutely. - He has been  started on doxycycline 100 bid to try to limit AVM bleeding (and for URI sx) => transition over to danazol as outpatient.   - Off ASA - INR 1.7. Goal INR will be 1.8-2.3 at this point, aim for 1.8-2 in hospital currently. Will not cover with heparin gtt at this time, allowing INR to slowly drift up.  - Transfuse hgb < 8.  Had suspected histamine reaction due to RBC transfusion. Will give benadryl with further transfusions.  2. Acute bronchitis/URI: Covering with course of doxy. Still some cough with sputum, add Mucinex.  3. Acute on chronic systolic CHF:  Ischemic cardiomyopathy, EF 20-25% with RV dysfunction on 2/18 echo pre-LVAD. 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 Medtronic ICD. He has had problems with RV failure post-op. He developed worsening renal dysfunction and finally had to start HD in 8/18. There has been some difficulty getting adequate fluid off him, currently dialyzing 4 days/week.  - VAD interrogated personally. Parameters stable. 4. CAD: s/p CABG. No chest pain.   - Off ASA with bleeding - He is on atorvastatin.  5. ESRD: Renal following AVF has matured and being used.  He tolerates HD poorly, has very poor QOL on days of HD.  We discussed possibility of PD with nephrology and surgery, but concensus from surgeons here appears to be that it would be very difficult to place PD catheter.   - Patient had a discussion with Dr. Jonnie Finner yesterday regarding his participation in the HD process and need to fluid restrict.  He seems to have a better understanding of this and realizes that with fluid restriction, he should be able to get back from 4 days/week to 3 days/week.  He wants to try this first, then if still requiring 4 days a week can re-address PD with nephrology. We discussed fluid restriction today, I put him in for < 1800 cc.  6. Atrial fibrillation: Chronic. Rate controlled.  7. RV failure:  - Continue revatio 40 mg TID.  - No change to current plan.   8. HTN: MAP stable 80s 9.  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  - Holding off on radiation. He does not think he can handle radiation as well as HD 4 days/week.  - No change to current plan.   10. H/o TAVR:  - Stable on recent Echo.   If Hgb remains stable, hopefully can go home by the end of the week.   I reviewed the LVAD parameters from today, and compared the results to the patient's prior recorded data.  No programming changes were made.  The LVAD is functioning within specified parameters.  The patient performs LVAD self-test daily.  LVAD interrogation was negative for any significant power changes, alarms or PI events/speed drops.  LVAD equipment check completed and is in good working order.  Back-up equipment present.   LVAD education done on emergency procedures and precautions and reviewed exit site care.   Length of Stay: 62  Loralie Champagne, MD 03/04/2018, 8:07 AM  VAD Team --- VAD ISSUES ONLY--- Pager [phone] (7am - 7am)  Advanced Heart Failure Team  Pager [phone] (M-F; 7a - 4p)  Please contact [facility] for night-coverage after hours (4p -7a ) and weekends on [email or web address]

## 2018-03-04 NOTE — Progress Notes (Signed)
LVAD Coordinator Rounding Note:  Admitted 02/23/18 per [name] due to symptomatic anemia.   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 Darcey Nora under DT criteria due to age excluding heart transplant.   Pt sitting up in chair - states he is "feeling better". Is looking forward to going home possibly the end of this week. He is scheduled for HD today.  Vital signs: Temp: 98.1 HR: 90 Doppler Pressure: 90  Automatic BP:  106/90 (96) - correlates with doppler MAP O2 Sat: 97% on RA Wt: 180>180>185>188>183>188>188>195>189>190bs  LVAD interrogation reveals:  Speed: 9600 Flow: 6.2 Power:  6.6 PI: 4.3 Alarms: none Events:  0 - 5 PI events (>100 on 02/28/18) Fixed speed: 9600 Low speed limit: 9000  Drive Line:  Existing [place] VAD dressing dry and intact; anchor in place and accurately applied. Next dressing change due 5/7.  Labs:  LDH trend: 257>232>224>235>217>201>228>221  INR trend: 2.93> 2.61>2.17>2.05>2.31>1.50>1.43>1.66  Hgb trend: 7.7>8.1>8.9>9.1>8.4>8.2>8.5>8.1>8.3  Anticoagulation Plan: -INR Goal:  2.0 - 2.5 -ASA Dose: no ASA due to hx of GI bleed  Blood Products:  -02/23/18>2 units PCs   Device: - Medtronic single lead -Therapies: on at 231 bpm  Adverse Events on VAD: 02/2017> GIB- AVM clipped x2 07/2017> renal failure- HD started 11/2017>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 Margins not clear at neck.   Plan/Recommendations:   1. Weekly dressing changes; due 03/10/18. Bedside RN may change dressing. 2. Please call VAD pager if any VAD equipment or drive line issues.  [name] RN, VAD Coordinator 24/7 VAD pager: [phone]

## 2018-03-05 ENCOUNTER — Telehealth (HOSPITAL_COMMUNITY): Payer: Self-pay | Admitting: Pharmacist

## 2018-03-05 ENCOUNTER — Encounter (HOSPITAL_COMMUNITY): Payer: Self-pay | Admitting: Unknown Physician Specialty

## 2018-03-05 ENCOUNTER — Other Ambulatory Visit (HOSPITAL_COMMUNITY): Payer: Self-pay | Admitting: Pharmacist

## 2018-03-05 LAB — RENAL FUNCTION PANEL
ALBUMIN: 3.3 g/dL — AB (ref 3.5–5.0)
ANION GAP: 6 (ref 5–15)
BUN: 18 mg/dL (ref 6–20)
CO2: 32 mmol/L (ref 22–32)
Calcium: 8.4 mg/dL — ABNORMAL LOW (ref 8.9–10.3)
Chloride: 98 mmol/L — ABNORMAL LOW (ref 101–111)
Creatinine, Ser: 2.78 mg/dL — ABNORMAL HIGH (ref 0.61–1.24)
GFR calc Af Amer: 25 mL/min — ABNORMAL LOW (ref >60)
GFR, EST NON AFRICAN AMERICAN: 21 mL/min — AB (ref >60)
Glucose, Bld: 168 mg/dL — ABNORMAL HIGH (ref 65–99)
PHOSPHORUS: 1.9 mg/dL — AB (ref 2.5–4.6)
Potassium: 4 mmol/L (ref 3.5–5.1)
Sodium: 136 mmol/L (ref 135–145)

## 2018-03-05 LAB — CBC
HCT: 24.4 % — ABNORMAL LOW (ref 39.0–52.0)
Hemoglobin: 8 g/dL — ABNORMAL LOW (ref 13.0–17.0)
MCH: 31.1 pg (ref 26.0–34.0)
MCHC: 32.8 g/dL (ref 30.0–36.0)
MCV: 94.9 fL (ref 78.0–100.0)
PLATELETS: 109 10*3/uL — AB (ref 150–400)
RBC: 2.57 MIL/uL — AB (ref 4.22–5.81)
RDW: 18.6 % — AB (ref 11.5–15.5)
WBC: 9.5 10*3/uL (ref 4.0–10.5)

## 2018-03-05 LAB — GLUCOSE, CAPILLARY
GLUCOSE-CAPILLARY: 141 mg/dL — AB (ref 65–99)
GLUCOSE-CAPILLARY: 148 mg/dL — AB (ref 65–99)
Glucose-Capillary: 198 mg/dL — ABNORMAL HIGH (ref 65–99)
Glucose-Capillary: 70 mg/dL (ref 65–99)

## 2018-03-05 LAB — PROTIME-INR
INR: 2.11
PROTHROMBIN TIME: 23.5 s — AB (ref 11.4–15.2)

## 2018-03-05 LAB — PREPARE RBC (CROSSMATCH)

## 2018-03-05 LAB — LACTATE DEHYDROGENASE: LDH: 224 U/L — AB (ref 98–192)

## 2018-03-05 MED ORDER — INSULIN GLARGINE 100 UNITS/ML SOLOSTAR PEN: 15.0000 [IU] | PEN_INJECTOR | Freq: Every day | SUBCUTANEOUS | 1 refills | Status: DC

## 2018-03-05 MED ORDER — DOXYCYCLINE HYCLATE 100 MG PO TABS: 100.0000 mg | ORAL_TABLET | Freq: Two times a day (BID) | ORAL | 6 refills | Status: DC

## 2018-03-05 MED ORDER — WARFARIN SODIUM 5 MG PO TABS: 5.0000 mg | ORAL_TABLET | Freq: Once | ORAL | Status: DC

## 2018-03-05 MED ORDER — WARFARIN SODIUM 5 MG PO TABS: 7.5000 mg | ORAL_TABLET | ORAL | 6 refills | Status: DC

## 2018-03-05 MED ORDER — DANAZOL 100 MG PO CAPS: 100.0000 mg | ORAL_CAPSULE | Freq: Two times a day (BID) | ORAL | 3 refills | Status: DC

## 2018-03-05 MED ORDER — TRAMADOL HCL 50 MG PO TABS: 50.0000 mg | ORAL_TABLET | Freq: Two times a day (BID) | ORAL | 3 refills | Status: DC | PRN

## 2018-03-05 MED ORDER — SODIUM CHLORIDE 0.9 % IV SOLN
Freq: Once | INTRAVENOUS | Status: AC
  Administered 2018-03-05: 11:00:00 via INTRAVENOUS

## 2018-03-05 MED ORDER — DIPHENHYDRAMINE HCL 25 MG PO CAPS
50.0000 mg | ORAL_CAPSULE | Freq: Once | ORAL | Status: AC
  Administered 2018-03-05: 50 mg via ORAL
  Filled 2018-03-05: qty 2

## 2018-03-05 MED ORDER — DANAZOL 100 MG PO CAPS: 100.0000 mg | ORAL_CAPSULE | Freq: Two times a day (BID) | ORAL | 5 refills | Status: DC

## 2018-03-05 NOTE — Progress Notes (Signed)
Patient ID: Trevor [name], male   DOB: 1946-06-21, 72 y.o.   MRN: [id number]   Advanced Heart Failure VAD Team Note  PCP-Cardiologist: No primary care provider on file.   Subjective:    Events: - 4/25 2u RBCs - 4/26 Colon/EGD - Colonoscopy with red blood and coffee-ground hematin was found in the entire colon (cecum to rectum). No source found for active bleeding despite copious irrigation and lavage and careful inspection.  - 4/26 capsule endo - Capsule endoscopy showed active bleeding in the ileum; source not found.  Given clinical history and LVAD status this is likely related to active bleeding ileal angioectasias/angiodysplasias. - 4/27 2u RBCs  He is on [place] octreotide and doxycycline currently. Breathing ok today, MAP 80s.  He had a BM yesterday.  Brown in color but had streak of blood.  Hgb 8.   INR 2.1.   LVAD Interrogation HM 2: Speed: 9600 Flow: 6.1 PI: 3.7 Power: 6.1, 15 PI events/24 hrs (had HD).   Objective:    Vital Signs:   Temp:  [98 F (36.7 C)-98.8 F (37.1 C)] 98.1 F (36.7 C) (05/02 0349) Pulse Rate:  [60-77] 64 (05/02 0349) Resp:  [9-11] 11 (05/02 0349) BP: (79-122)/(56-95) 82/71 (05/02 0349) SpO2:  [93 %-100 %] 98 % (05/02 0349) Weight:  [184 lb (83.5 kg)-190 lb 7.6 oz (86.4 kg)] 184 lb (83.5 kg) (05/02 0349) Last BM Date: 03/04/18   Mean arterial Pressure 80s  Intake/Output:   Intake/Output Summary (Last 24 hours) at 03/05/2018 0748 Last data filed at 03/04/2018 1835 Gross per 24 hour  Intake 590 ml  Output 2500 ml  Net -1910 ml     Physical Exam    GENERAL: Well appearing this am. NAD.  HEENT: Normal. NECK: Supple, JVP 7-8 cm. Carotids OK.  CARDIAC:  Mechanical heart sounds with LVAD hum present.  LUNGS:  CTAB, normal effort.  ABDOMEN:  NT, ND, no HSM. No bruits or masses. +BS  LVAD exit site: Well-healed and incorporated. Dressing dry and intact. No erythema or drainage. Stabilization device present and accurately applied. Driveline dressing  changed daily per sterile technique. EXTREMITIES:  Warm and dry. No cyanosis, clubbing, rash.  Trace ankle edema.  NEUROLOGIC:  Alert & oriented x 3. Cranial nerves grossly intact. Moves all 4 extremities w/o difficulty. Affect pleasant    Telemetry   Afib 70s, personally reviewed.   EKG    No new tracings.    Labs   Basic Metabolic Panel: Recent Labs  Lab 03/01/18 0230 03/02/18 0448 03/03/18 0236 03/04/18 0203 03/05/18 0234  NA 133* 132* 135 136 136  K 3.7 3.9 4.3 4.3 4.0  CL 96* 97* 98* 99* 98*  CO2 30 29 29 30  32  GLUCOSE 212* 105* 204* 114* 168*  BUN 14 28* 16 29* 18  CREATININE 2.88* 3.94* 2.76* 3.52* 2.78*  CALCIUM 8.2* 8.2* 8.4* 8.6* 8.4*  PHOS 2.9 3.1 1.9* 2.4* 1.9*    Liver Function Tests: Recent Labs  Lab 03/01/18 0230 03/02/18 0448 03/03/18 0236 03/04/18 0203 03/05/18 0234  ALBUMIN 3.0* 3.2* 3.1* 3.2* 3.3*   No results for input(s): LIPASE, AMYLASE in the last 168 hours. No results for input(s): AMMONIA in the last 168 hours.  CBC: Recent Labs  Lab 03/01/18 0230 03/02/18 0448 03/03/18 0236 03/04/18 0203 03/05/18 0234  WBC 9.6 8.6 8.6 9.1 9.5  HGB 9.2* 8.5* 8.1* 8.3* 8.0*  HCT 27.8* 25.9* 25.2* 26.0* 24.4*  MCV 92.4 93.5 94.0 94.9 94.9  PLT 104*  87* 97* 104* 109*    INR: Recent Labs  Lab 03/01/18 0230 03/02/18 0448 03/03/18 0236 03/04/18 0203 03/05/18 0234  INR 1.75 1.50 1.43 1.66 2.11    Other results:    Imaging   No results found.   Medications:     Scheduled Medications: . atorvastatin  40 mg Oral q1800  . budesonide  0.25 mg Inhalation BID  . busPIRone  5 mg Oral BID  . calcitRIOL  0.5 mcg Oral Q M,W,F-HD  . citalopram  20 mg Oral Daily  . darbepoetin (ARANESP) injection - DIALYSIS  60 mcg Intravenous Q Wed-HD  . docusate sodium  100 mg Oral Daily  . doxycycline  100 mg Oral Q12H  . gabapentin  300 mg Oral BID  . heparin  5,800 Units Dialysis Once in dialysis  . insulin aspart  0-5 Units Subcutaneous QHS  .  insulin aspart  0-9 Units Subcutaneous TID WC  . insulin glargine  15 Units Subcutaneous QHS  . levothyroxine  25 mcg Oral QAC breakfast  . magnesium oxide  400 mg Oral Daily  . multivitamin  1 tablet Oral QHS  . octreotide  50 mcg Subcutaneous TID  . pantoprazole  40 mg Oral Daily  . polyethylene glycol  17 g Oral Once  . sevelamer carbonate  3,200 mg Oral TID WC  . sildenafil  40 mg Oral TID  . Warfarin - Pharmacist Dosing Inpatient   Does not apply q1800    Infusions: . sodium chloride    . sodium chloride    . sodium chloride    . sodium chloride      PRN Medications: sodium chloride, sodium chloride, acetaminophen, albuterol, alteplase, dextromethorphan-guaiFENesin, heparin, lidocaine (PF), lidocaine-prilocaine, ondansetron (ZOFRAN) IV, pentafluoroprop-tetrafluoroeth, traMADol, traZODone   Patient Profile   Trevor [name] is a 71 y.o. male with a history of CAD s/p CABG x 4 2010, OSA, AS with TAVR 2015, , ESRD on HD, DM2, chronic atrial fibrillation, asthma, and ischemic cardiomyopathy with Medtronic ICD.  S/p HMII LVAD for Destination therapy on 12/14/2015.   Presented to [facility] 02/23/18 with worsening SOB and malaise s/p HD.   Assessment/Plan:    1. 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 Underwent Colon/EGD 02/27/18 with normal EGD. Colonoscopy with red blood and coffee-ground heme was found in the entire colon (cecum to rectum). No source found for active bleeding despite copious irrigation and lavage and careful inspection.  Capsule Endoscopy 02/27/18: Showed active bleeding in the ileum; source not found.  Given clinical history and LVAD status this is likely related to active bleeding ileal angioectasias/angiodysplasias. Felt not to be reachable by double balloon at [facility] per Dr. Hilarie Fredrickson.  Hgb fairly stable at 8.  Still with blood streaking in BM but no melena.  - Will give 1 unit PRBCs today. Had suspected histamine reaction due to RBC  transfusion. Will give benadryl with further transfusions.  - He has been started on doxycycline 100 bid to try to limit AVM bleeding (and for URI sx) => transition over to danazol as outpatient.   - Continue outpatient octreotide.  - Off ASA - INR 1.7. Goal INR will be 1.8-2.3 at this point, aim for 1.8-2 in hospital currently.  2. Acute bronchitis/URI: Covering with course of doxy. Improved.  3. Acute on chronic systolic CHF: Ischemic cardiomyopathy, EF 20-25% with RV dysfunction on 2/18 echo pre-LVAD. 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 Medtronic ICD. He has had problems with RV failure post-op. He  developed worsening renal dysfunction and finally had to start HD in 8/18. There has been some difficulty getting adequate fluid off him, currently dialyzing 4 days/week.  - VAD interrogated personally. Parameters stable. 4. CAD: s/p CABG. No chest pain.   - Off ASA with bleeding - He is on atorvastatin.  5. ESRD: Renal following AVF has matured and being used.  He tolerates HD poorly, has very poor QOL on days of HD.  We discussed possibility of PD with nephrology and surgery, but concensus from surgeons here appears to be that it would be very difficult to place PD catheter.   - Patient had a discussion with Dr. Jonnie Finner regarding his participation in the HD process and need to fluid restrict.  He seems to have a better understanding of this and realizes that with fluid restriction, he should be able to get back from 4 days/week to 3 days/week.  He wants to try this first, then if still requiring 4 days a week can re-address PD with nephrology. We discussed fluid restriction today, I put him in for < 1800 cc.  6. Atrial fibrillation: Chronic. Rate controlled.  7. RV failure:  - Continue revatio 40 mg TID.  - No change to current plan.   8. HTN: MAP stable 80s 9. 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  -  Holding off on radiation. He does not think he can handle radiation as well as HD 4 days/week.  - No change to current plan.   10. H/o TAVR:  - Stable on recent Echo.   I will let him go home today after blood transfusion. Meds for home: doxycycline 100 mg bid (will continue for AVMs until we can get him on danazol as outpatient), octreotide injections, atorvastatin 40 daily, warfarin goal INR 1.8-2.3, Revatio 40 tid.   I reviewed the LVAD parameters from today, and compared the results to the patient's prior recorded data.  No programming changes were made.  The LVAD is functioning within specified parameters.  The patient performs LVAD self-test daily.  LVAD interrogation was negative for any significant power changes, alarms or PI events/speed drops.  LVAD equipment check completed and is in good working order.  Back-up equipment present.   LVAD education done on emergency procedures and precautions and reviewed exit site care.   Length of Stay: [place], MD 03/05/2018, 7:48 AM  VAD Team --- VAD ISSUES ONLY--- Pager [phone] (7am - 7am)  Advanced Heart Failure Team  Pager [phone] (M-F; 7a - 4p)  Please contact [facility] for night-coverage after hours (4p -7a ) and weekends on [email or web address]

## 2018-03-05 NOTE — Telephone Encounter (Signed)
[facility] who stated that Mr. Mchaney's danazol will be $34.40/mo. They have to order it so will not have it until tomorrow after 3 pm.   Doroteo Bradford K. Velva Harman, PharmD, BCPS, CPP Clinical Pharmacist Phone: [phone] 03/05/2018 3:07 PM

## 2018-03-05 NOTE — Progress Notes (Signed)
[facility] Progress Note  Subjective: no c/o  Vitals:   03/05/18 1013 03/05/18 1020 03/05/18 1045 03/05/18 1236  BP:  (!) 79/66 (!) 83/72 119/81  Pulse: 72  (!) 54 (!) 50  Resp:   16 12  Temp: 98.2 F (36.8 C)  98.2 F (36.8 C) (!) 97.5 F (36.4 C)  TempSrc: Oral  Oral Oral  SpO2:   98% 99%  Weight:      Height:        Inpatient medications: . atorvastatin  40 mg Oral q1800  . budesonide  0.25 mg Inhalation BID  . busPIRone  5 mg Oral BID  . calcitRIOL  0.5 mcg Oral Q M,W,F-HD  . citalopram  20 mg Oral Daily  . darbepoetin (ARANESP) injection - DIALYSIS  60 mcg Intravenous Q Wed-HD  . docusate sodium  100 mg Oral Daily  . doxycycline  100 mg Oral Q12H  . gabapentin  300 mg Oral BID  . heparin  5,800 Units Dialysis Once in dialysis  . insulin aspart  0-5 Units Subcutaneous QHS  . insulin aspart  0-9 Units Subcutaneous TID WC  . insulin glargine  15 Units Subcutaneous QHS  . levothyroxine  25 mcg Oral QAC breakfast  . magnesium oxide  400 mg Oral Daily  . multivitamin  1 tablet Oral QHS  . octreotide  50 mcg Subcutaneous TID  . pantoprazole  40 mg Oral Daily  . polyethylene glycol  17 g Oral Once  . sevelamer carbonate  3,200 mg Oral TID WC  . sildenafil  40 mg Oral TID  . warfarin  5 mg Oral ONCE-1800  . Warfarin - Pharmacist Dosing Inpatient   Does not apply q1800   . sodium chloride    . sodium chloride    . sodium chloride     sodium chloride, sodium chloride, acetaminophen, albuterol, alteplase, dextromethorphan-guaiFENesin, heparin, lidocaine (PF), lidocaine-prilocaine, ondansetron (ZOFRAN) IV, pentafluoroprop-tetrafluoroeth, traMADol, traZODone  Exam: Alert, no distress NO jvd Chest occ scattered basilar rales Cor LVAD noise Abd soft ntnd obese Ext 1+ pitting edema NF, ox 3 L AVF+bruit  Dialysis: [facility] MW[facility]at  4.5h 180NRe 400/800 EDW 83kg 2K/2Ca  L AVF Hep 5800 Venofer 100mg  IV x 5 (4/5 dosed) Mircera 59mcg IV q 2 weeks (last 4/17)   Calcitriol 0.52mcg PO TIW  Renvela 4 tabs tid qac, 2 bid q snacks  CXR 4/22 - clear CXR 4/27 - mild vasc congestion      Impression: 1  Anemia/ GIB - felt due to AVM's on doxy/ octreotide, sp 5u prbc 2  ESRD - on HD MWFSat, on hd 4 times per wk due to excessive fluid intake have d/w pt 3  Volume - close to dry wt today 4  CM sp LVAD/ ICD 5  CAD hx CABG/ afib - on coumadin 6  Anemia of CKD - gave darbe 60 ug x1 on 5/1 w hd  Plan - for dc home today   Kelly Splinter MD [facility] pager [phone]   03/05/2018, 3:07 PM   Recent Labs  Lab 03/03/18 0236 03/04/18 0203 03/05/18 0234  NA 135 136 136  K 4.3 4.3 4.0  CL 98* 99* 98*  CO2 29 30 32  GLUCOSE 204* 114* 168*  BUN 16 29* 18  CREATININE 2.76* 3.52* 2.78*  CALCIUM 8.4* 8.6* 8.4*  PHOS 1.9* 2.4* 1.9*   Recent Labs  Lab 03/03/18 0236 03/04/18 0203 03/05/18 0234  ALBUMIN 3.1* 3.2* 3.3*   Recent Labs  Lab 03/03/18 0236 03/04/18 0203 03/05/18 0234  WBC 8.6 9.1 9.5  HGB 8.1* 8.3* 8.0*  HCT 25.2* 26.0* 24.4*  MCV 94.0 94.9 94.9  PLT 97* 104* 109*   Iron/TIBC/Ferritin/ %Sat    Component Value Date/Time   IRON 125 06/14/2017 1605   TIBC 221 (L) 06/14/2017 1605   FERRITIN 622 (H) 06/14/2017 1605   IRONPCTSAT 57 (H) 06/14/2017 1605

## 2018-03-05 NOTE — Discharge Summary (Addendum)
Advanced Heart Failure Team  Discharge Summary   Patient ID: Trevor [name] MRN: [id number], DOB/AGE: 1946-08-13 72 y.o. Admit date: 02/23/2018 D/C date:     03/05/2018   Primary Discharge Diagnoses:  1. GI bleeding 2. Acute bronchitis/URI 3.Acute on chronic systolic CHF: Ischemic cardiomyopathy, EF 20-25% with RV dysfunction on 2/18 echo pre-LVAD. 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 Medtronic ICD.  4. CAD: s/p CABG.No chest pain.   5. ESRD: 6. Chronic Atrial fibrillation 7. RV failure: 8. HTN 9. Squamous cell skin cancer 10. H/o TAVR     Hospital Course:  Trevor Mccleery Murphyis a 72 y.o.malewith a history of CAD s/p CABG x 4 2010, OSA, AS with TAVR 2015, ,ESRD on HD,DM2,chronicatrial fibrillation, asthma, GI bleed, and ischemic cardiomyopathy with Medtronic ICD.S/P HMII LVAD for Destination therapy on 12/14/2015.  Presented to [facility] 02/23/18 with worsening SOB and malaise s/p HD.Hemoglobin was down 2 grams on admit -->7.7. GI consulted. He had recurrent GI bleed and underwent colonoscopy and capsule endoscopy. Overall he received 5 UPRBCs. He continued on iHD and will continue outpatient regimen. He will continue to be followed closely in the VAD clinic and has follow up next week for INR check and dressing change.   1. GI bleeding:Colonic AVMs on colonoscopy 4/18 admission, APC + clipping. Underwent Colon/EGD 02/27/18 with normal EGD. Colonoscopy with red blood and coffee-ground heme was found in the entire colon (cecum to rectum). No source found for active bleeding despite copious irrigation and lavage and careful inspection.  Capsule Endoscopy 02/27/18: Showed active bleeding in the ileum; source not found. Given clinical history and LVAD status this is likely related to active bleeding ileal angioectasias/angiodysplasias. Felt not to be reachable by double balloon at [facility] per Dr. Hilarie Fredrickson.  Hgb fairly stable at 8.  Still with blood streaking in BM but no melena.  -Overall he was  transfused 5UPRBCs. Had suspected histamine reaction due to RBC transfusion. Will give benadryl with further transfusions.  - He has been started on doxycycline 100 bid to try to limit AVM bleeding (and for URI sx) => transition over to danazol as outpatient.  - Continue monthly outpatient octreotide.  - Off ASA -INR 2.11 . Goal INR will be 1.8-2.3  2. Acute bronchitis/URI: Covered  with course of doxy. 3.Acute on chronic systolic CHF: Ischemic cardiomyopathy, EF 20-25% with RV dysfunction on 2/18 echo pre-LVAD. 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 Medtronic ICD. He has had problems with RV failure post-op. He developed worsening renal dysfunction and finally had to start HD in 8/18. There has been some difficulty getting adequate fluid off him, currently dialyzing 4 days/week. - VAD interrogated personally. Parameters stable. 4. CAD: s/p CABG.No chest pain.   - Off ASA with bleeding -He is on atorvastatin. 5. ESRD: Renal following AVF has matured and being used.  He tolerates HD poorly, has very poor QOL on days of HD.  We discussed possibility of PD with nephrology and surgery, but concensus from surgeons here appears to be that it would be very difficult to place PD catheter.   - Patient had a discussion with Dr. Jonnie Finner regarding his participation in the HD process and need to fluid restrict.  He seems to have a better understanding of this and realizes that with fluid restriction, he should be able to get back from 4 days/week to 3 days/week.  He wants to try this first, then if still requiring 4 days a week can re-address PD with nephrology. We discussed fluid restriction today.  6. Atrial fibrillation: Chronic.Rate controlled.  7. RV failure: - Continue revatio 40 mg TID.  - No change to current plan.   8. HTN:MAP stable 80s 9. 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 - Holding off on  radiation. Hedoes not think he can handle radiationas well asHD4days/week.  - No change to current plan.   10. H/o TAVR: - Stable on recent Echo.   LVAD Interrogation HM 2: Speed: 9600 Flow: 6.1 PI: 3.7 Power: 6.1, 15 PI events/24 hrs (had HD).   Discharge Weight: 184 pounds  Discharge Vitals: Blood pressure 119/81, pulse (!) 50, temperature (!) 97.5 F (36.4 C), temperature source Oral, resp. rate 12, height 5\' 5"  (1.651 m), weight 184 lb (83.5 kg), SpO2 99 %.  Labs: Lab Results  Component Value Date   WBC 9.5 03/05/2018   HGB 8.0 (L) 03/05/2018   HCT 24.4 (L) 03/05/2018   MCV 94.9 03/05/2018   PLT 109 (L) 03/05/2018    Recent Labs  Lab 03/05/18 0234  NA 136  K 4.0  CL 98*  CO2 32  BUN 18  CREATININE 2.78*  CALCIUM 8.4*  GLUCOSE 168*   Lab Results  Component Value Date   CHOL 59 12/04/2016   HDL 30 (L) 12/04/2016   LDLCALC 15 12/04/2016   TRIG 68 12/04/2016   BNP (last 3 results) No results for input(s): BNP in the last 8760 hours.  ProBNP (last 3 results) No results for input(s): PROBNP in the last 8760 hours.   Diagnostic Studies/Procedures   No results found.  Discharge Medications   Allergies as of 03/05/2018   No Known Allergies     Medication List    TAKE these medications   albuterol 108 (90 Base) MCG/ACT inhaler Commonly known as:  PROVENTIL HFA;VENTOLIN HFA Inhale 2 puffs into the lungs every 4 (four) hours as needed for wheezing or shortness of breath. What changed:  Another medication with the same name was removed. Continue taking this medication, and follow the directions you see here.   atorvastatin 40 MG tablet Commonly known as:  LIPITOR TAKE 1 TABLET(40 MG) BY MOUTH DAILY What changed:    how much to take  how to take this  when to take this  additional instructions   AURYXIA 1 GM 210 MG(Fe) tablet Generic drug:  ferric citrate Take 420 mg by mouth 3 (three) times daily with meals.   busPIRone 5 MG  tablet Commonly known as:  BUSPAR Take 1 tablet (5 mg total) by mouth 2 (two) times daily.   calcitRIOL 0.5 MCG capsule Commonly known as:  ROCALTROL Take 1 capsule (0.5 mcg total) by mouth every other day. What changed:  when to take this   danazol 100 MG capsule Commonly known as:  DANOCRINE Take 1 capsule (100 mg total) by mouth 2 (two) times daily.   docusate sodium 100 MG capsule Commonly known as:  COLACE Take 1 capsule (100 mg total) by mouth 2 (two) times daily. What changed:  when to take this   doxycycline 100 MG tablet Commonly known as:  VIBRA-TABS Take 1 tablet (100 mg total) by mouth every 12 (twelve) hours.   fluticasone 110 MCG/ACT inhaler Commonly known as:  FLOVENT HFA Inhale 2 puffs into the lungs 2 (two) times daily.   gabapentin 600 MG tablet Commonly known as:  NEURONTIN Take 0.5 tablets (300 mg total) by mouth 2 (two) times daily.  insulin glargine 100 unit/mL Sopn Commonly known as:  LANTUS Inject 0.15 mLs (15 Units total) into the skin at bedtime. What changed:  how much to take   levothyroxine 25 MCG tablet Commonly known as:  SYNTHROID, LEVOTHROID Take 1 tablet (25 mcg total) by mouth daily before breakfast.   magnesium oxide 400 MG tablet Commonly known as:  MAG-OX Take 400 mg by mouth daily.   multivitamin Tabs tablet Take 1 tablet by mouth at bedtime.   pantoprazole 40 MG tablet Commonly known as:  PROTONIX Take 1 tablet (40 mg total) by mouth daily.   sevelamer carbonate 800 MG tablet Commonly known as:  RENVELA Take 1,600-2,400 mg by mouth See admin instructions. Take 2,400 mg by mouth three times a day with meals and 1,600 mg two times a day with snacks   sildenafil 20 MG tablet Commonly known as:  REVATIO Take 2 tablets (40 mg total) 3 (three) times daily by mouth. What changed:    when to take this  additional instructions   traMADol 50 MG tablet Commonly known as:  ULTRAM Take 1 tablet (50 mg total) by mouth every  12 (twelve) hours as needed. TAKE 1 TABLET BY MOUTH EVERY 6 HOURS AS NEEDED FOR MODERATE PAIN What changed:  when to take this   traZODone 100 MG tablet Commonly known as:  DESYREL TAKE 1 TABLET BY MOUTH EVERY NIGHT AT BEDTIME AS NEEDED FOR SLEEP What changed:    how much to take  how to take this  when to take this  additional instructions   warfarin 5 MG tablet Commonly known as:  COUMADIN Take as directed. If you are unsure how to take this medication, talk to your nurse or doctor. Original instructions:  Take 1.5 tablets (7.5 mg total) by mouth See admin instructions. What changed:    how much to take  additional instructions       Disposition   The patient will be discharged in stable condition to home. Discharge Instructions    (HEART FAILURE PATIENTS) Call MD:  Anytime you have any of the following symptoms: 1) 3 pound weight gain in 24 hours or 5 pounds in 1 week 2) shortness of breath, with or without a dry hacking cough 3) swelling in the hands, feet or stomach 4) if you have to sleep on extra pillows at night in order to breathe.   Complete by:  As directed    Diet - low sodium heart healthy   Complete by:  As directed    Heart Failure patients record your daily weight using the same scale at the same time of day   Complete by:  As directed    INR  Goal: 1.8 - 2.3   Complete by:  As directed    Goal:  1.8 - 2.3   Increase activity slowly   Complete by:  As directed    Page VAD Coordinator at [phone]  Notify for: any VAD alarms, sustained elevations of power >10 watts, sustained drop in Pulse Index <3   Complete by:  As directed    Notify for:   any VAD alarms sustained elevations of power >10 watts sustained drop in Pulse Index <3     Speed Settings:   Complete by:  As directed    Fixed 9600 RPM Low 9000 RPM     Follow-up Information    Larey Dresser, MD Follow up on 03/10/2018.   Specialty:  Cardiology Why:  Dressing chane/INR at  9000 Contact information: [address] [place] [address] [phone]             Duration of Discharge Encounter: Greater than 35 minutes   Signed, Elbony Mcclimans NP-C  03/05/2018, 3:07 PM

## 2018-03-05 NOTE — Progress Notes (Signed)
Inpatient Diabetes Program Recommendations  AACE/ADA: New Consensus Statement on Inpatient Glycemic Control (2019)  Target Ranges:  Prepandial:   less than 140 mg/dL      Peak postprandial:   less than 180 mg/dL (1-2 hours)      Critically ill patients:  140 - 180 mg/dL  Results for BERTRUM, HELMSTETTER (MRN [id number]) as of 03/05/2018 09:24  Ref. Range 03/04/2018 04:52 03/04/2018 08:23 03/04/2018 12:52 03/04/2018 16:35 03/04/2018 22:28 03/05/2018 08:17  Glucose-Capillary Latest Ref Range: 65 - 99 mg/dL 61 (L) 96 185 (H) 143 (H) 198 (H) 70   Results for HOLTON, SIDMAN (MRN [id number]) as of 03/04/2018 11:35  Ref. Range 03/03/2018 08:39 03/03/2018 12:02 03/03/2018 17:41 03/04/2018 04:52 03/04/2018 08:23  Glucose-Capillary Latest Ref Range: 65 - 99 mg/dL 108 (H) 159 (H) 158 (H) 61 (L) 96   Review of Glycemic Control  Current orders for Inpatient glycemic control: Lantus 15 units QHS, Novolog 0-9 units TID with meals, Novolog 0-5 units QHS  Inpatient Diabetes Program Recommendations: Insulin - Basal: Glucose of 61 mg at 4:52 am on 03/04/18 and 70 mg/dl at 8:17 today. Please consider decreasing Lantus to 13 units QHS.  Thanks, Barnie Alderman, RN, MSN, CDE Diabetes Coordinator Inpatient Diabetes Program [phone] (Team Pager from 8am to 5pm)

## 2018-03-05 NOTE — Progress Notes (Signed)
Script for Danazol 100 mg BID for cost assessment.   [name], BSN VAD Coordinator 24/7 Pager [phone]

## 2018-03-05 NOTE — Progress Notes (Signed)
Pt. Received all discharge paperwork and verbalized understanding with doxycyline and danazol. Clarified with Judson Roch that pt. will wait to take danazol until Tuesday when he is seen in the clinic.

## 2018-03-05 NOTE — Plan of Care (Signed)
Pt. Received discharge paperwork.

## 2018-03-05 NOTE — Progress Notes (Signed)
LVAD Coordinator Rounding Note:  Admitted 02/23/18 per [name] due to symptomatic anemia.   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 Darcey Nora under DT criteria due to age excluding heart transplant.   Pt sitting up in chair - states he is "feeling better". Is looking forward to going home today after his blood transfusion.  Vital signs: Temp: 98.1 HR: 70 Doppler Pressure: 88  Automatic BP:  102/87 (94) - correlates with doppler MAP O2 Sat: 98% on RA Wt: 180>180>185>188>183>188>188>195>189>190>184bs  LVAD interrogation reveals:  Speed: 9600 Flow: 6.8 Power:  6.9 PI: 2.5 Alarms: none Events:  0 - 5 PI events (>100 on 02/28/18) Fixed speed: 9600 Low speed limit: 9000  Drive Line:  Existing [place] VAD dressing dry and intact; anchor in place and accurately applied. Next dressing change due 5/7.  Labs:  LDH trend: 257>232>224>235>217>201>228>221>224  INR trend: 2.93> 2.61>2.17>2.05>2.31>1.50>1.43>1.66>2.11  Hgb trend: 7.7>8.1>8.9>9.1>8.4>8.2>8.5>8.1>8.3>8.0  Anticoagulation Plan: -INR Goal:  2.0 - 2.5 -ASA Dose: no ASA due to hx of GI bleed  Blood Products:  -02/23/18>2 units PCs  -03/05/18>1 unit PCs  Device: - Medtronic single lead -Therapies: on at 231 bpm  Adverse Events on VAD: 02/2017> GIB- AVM clipped x2 07/2017> renal failure- HD started 11/2017>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 Margins not clear at neck.   Plan/Recommendations:   1. Weekly dressing changes; due 03/10/18. Bedside RN may change dressing. 2. Pt may discharge home after blood. 3. Follow up for dressing change and INR on Tuesday at 0900. 4. Danazol 100 mg BID sent to pts pharmacy for cost assessment. 5. Please call VAD pager if any VAD equipment or drive line issues.  [name], VAD Coordinator 24/7 VAD pager: [phone]

## 2018-03-05 NOTE — Progress Notes (Signed)
ANTICOAGULATION CONSULT NOTE  Pharmacy Consult for warfarin Indication: LVAD  No Known Allergies  Patient Measurements: Height: 5\' 5"  (165.1 cm) Weight: 184 lb (83.5 kg) IBW/kg (Calculated) : 61.5   Vital Signs: Temp: 98.1 F (36.7 C) (05/02 0349) Temp Source: Oral (05/02 0349) BP: 82/71 (05/02 0349) Pulse Rate: 64 (05/02 0349)  Labs: Recent Labs    03/03/18 0236 03/04/18 0203 03/05/18 0234  HGB 8.1* 8.3* 8.0*  HCT 25.2* 26.0* 24.4*  PLT 97* 104* 109*  LABPROT 17.3* 19.5* 23.5*  INR 1.43 1.66 2.11  CREATININE 2.76* 3.52* 2.78*    Estimated Creatinine Clearance: 23.9 mL/min (A) (by C-G formula based on SCr of 2.78 mg/dL (H)).   Medical History: Past Medical History:  Diagnosis Date  . AICD (automatic cardioverter/defibrillator) present   . Asthma   . AVM (arteriovenous malformation) of colon 07/07/2017  . CHF (congestive heart failure) ([place])   . Diabetes ([place])   . ESRF (end stage renal failure) ([place]) 07/07/2017   pt receiving hemodialysis Monday- Wednesday-friday, sometimes Saturday  . Kidney disease   . LVAD (left ventricular assist device) present ([place]) 12/2016  . Permanent atrial fibrillation ([place]) 07/07/2017  . Presence of permanent cardiac pacemaker    AICD  . Sleep apnea      Assessment: 71yom with HF Hx LVAD HM2 presented to ED with SOB and feeling bad in setting of supratherapeutic INR of 2.93. Pt now s/p EGD and colonoscopy 4/26 with bleeding of unclear source and started on octreotide/doxycycline to control bleeding.   INR therapeutic at 2.11, Hgb stable. Noted DDI with doxycycline.  PTA warfarin dose 10mg  Tu/Th and 7.5mg  all other days   Goal of Therapy:  INR Decrease 1.8-2 while in hospital then 1.8-2.3 as outpatient Monitor platelets by anticoagulation protocol: Yes   Plan:  -Warfarin 5mg  PO x1 tonight -Daily INR -If discharged today would reduce home dose to 7.5mg  daily with INR check Friday or Monday in clinic  Arrie Senate, PharmD,  [place] PGY-2 Cardiology Pharmacy Resident Pager: [phone] 03/05/2018

## 2018-03-06 ENCOUNTER — Telehealth: Payer: Self-pay | Admitting: *Deleted

## 2018-03-06 ENCOUNTER — Other Ambulatory Visit (HOSPITAL_COMMUNITY): Payer: Self-pay | Admitting: Unknown Physician Specialty

## 2018-03-06 DIAGNOSIS — Z95811 Presence of heart assist device: Secondary | ICD-10-CM

## 2018-03-06 DIAGNOSIS — Z7901 Long term (current) use of anticoagulants: Secondary | ICD-10-CM

## 2018-03-06 LAB — BPAM RBC
BLOOD PRODUCT EXPIRATION DATE: 201905092359
ISSUE DATE / TIME: 201905021009
UNIT TYPE AND RH: 9500

## 2018-03-06 LAB — TYPE AND SCREEN
ABO/RH(D): A NEG
Antibody Screen: NEGATIVE
Unit division: 0

## 2018-03-06 NOTE — Telephone Encounter (Signed)
Pt was on TCM report admitted [place] 02/23/18 with worsening SOB and malaise s/p HD.Hemoglobin was down 2 grams on admit -->7.7. GI consulted. He had recurrent GI bleed and underwent colonoscopy and capsule endoscopy. Overall he received 5 UPRBCs. He continued on iHD and will continue outpatient regimen. He will continue to be followed closely in the VAD clinic and has follow up next week for INR check and dressing change. Pt d/C 03/05/18, and will follow-up w/cardiology 03/10/18.Marland KitchenJohny Chess

## 2018-03-09 ENCOUNTER — Telehealth (HOSPITAL_COMMUNITY): Payer: Self-pay | Admitting: *Deleted

## 2018-03-09 ENCOUNTER — Inpatient Hospital Stay (HOSPITAL_COMMUNITY)
Admission: EM | Admit: 2018-03-09 | Discharge: 2018-03-10 | DRG: 377 | Disposition: A | Discharge status: Other Acute Inpatient Hospital | Payer: Medicare Other | Attending: Cardiology | Admitting: Cardiology

## 2018-03-09 ENCOUNTER — Emergency Department (HOSPITAL_COMMUNITY): Payer: Medicare Other

## 2018-03-09 ENCOUNTER — Other Ambulatory Visit: Payer: Self-pay

## 2018-03-09 ENCOUNTER — Encounter (HOSPITAL_COMMUNITY): Payer: Self-pay | Admitting: Emergency Medicine

## 2018-03-09 DIAGNOSIS — Z794 Long term (current) use of insulin: Secondary | ICD-10-CM | POA: Diagnosis not present

## 2018-03-09 DIAGNOSIS — I5023 Acute on chronic systolic (congestive) heart failure: Secondary | ICD-10-CM | POA: Diagnosis present

## 2018-03-09 DIAGNOSIS — D631 Anemia in chronic kidney disease: Secondary | ICD-10-CM | POA: Diagnosis present

## 2018-03-09 DIAGNOSIS — Z95811 Presence of heart assist device: Secondary | ICD-10-CM | POA: Diagnosis not present

## 2018-03-09 DIAGNOSIS — Z952 Presence of prosthetic heart valve: Secondary | ICD-10-CM | POA: Diagnosis not present

## 2018-03-09 DIAGNOSIS — J449 Chronic obstructive pulmonary disease, unspecified: Secondary | ICD-10-CM | POA: Diagnosis present

## 2018-03-09 DIAGNOSIS — E1122 Type 2 diabetes mellitus with diabetic chronic kidney disease: Secondary | ICD-10-CM | POA: Diagnosis present

## 2018-03-09 DIAGNOSIS — K31811 Angiodysplasia of stomach and duodenum with bleeding: Secondary | ICD-10-CM

## 2018-03-09 DIAGNOSIS — I482 Chronic atrial fibrillation: Secondary | ICD-10-CM | POA: Diagnosis present

## 2018-03-09 DIAGNOSIS — Z95810 Presence of automatic (implantable) cardiac defibrillator: Secondary | ICD-10-CM | POA: Diagnosis not present

## 2018-03-09 DIAGNOSIS — R059 Cough, unspecified: Secondary | ICD-10-CM

## 2018-03-09 DIAGNOSIS — R05 Cough: Secondary | ICD-10-CM | POA: Diagnosis present

## 2018-03-09 DIAGNOSIS — Z992 Dependence on renal dialysis: Secondary | ICD-10-CM

## 2018-03-09 DIAGNOSIS — Z7951 Long term (current) use of inhaled steroids: Secondary | ICD-10-CM

## 2018-03-09 DIAGNOSIS — I132 Hypertensive heart and chronic kidney disease with heart failure and with stage 5 chronic kidney disease, or end stage renal disease: Secondary | ICD-10-CM | POA: Diagnosis present

## 2018-03-09 DIAGNOSIS — N2581 Secondary hyperparathyroidism of renal origin: Secondary | ICD-10-CM | POA: Diagnosis present

## 2018-03-09 DIAGNOSIS — G4733 Obstructive sleep apnea (adult) (pediatric): Secondary | ICD-10-CM | POA: Diagnosis present

## 2018-03-09 DIAGNOSIS — Z79899 Other long term (current) drug therapy: Secondary | ICD-10-CM | POA: Diagnosis not present

## 2018-03-09 DIAGNOSIS — I2510 Atherosclerotic heart disease of native coronary artery without angina pectoris: Secondary | ICD-10-CM | POA: Diagnosis present

## 2018-03-09 DIAGNOSIS — C4492 Squamous cell carcinoma of skin, unspecified: Secondary | ICD-10-CM | POA: Diagnosis present

## 2018-03-09 DIAGNOSIS — I340 Nonrheumatic mitral (valve) insufficiency: Secondary | ICD-10-CM | POA: Diagnosis not present

## 2018-03-09 DIAGNOSIS — K922 Gastrointestinal hemorrhage, unspecified: Secondary | ICD-10-CM

## 2018-03-09 DIAGNOSIS — I255 Ischemic cardiomyopathy: Secondary | ICD-10-CM | POA: Diagnosis present

## 2018-03-09 DIAGNOSIS — D62 Acute posthemorrhagic anemia: Secondary | ICD-10-CM | POA: Diagnosis not present

## 2018-03-09 DIAGNOSIS — K921 Melena: Secondary | ICD-10-CM | POA: Diagnosis present

## 2018-03-09 DIAGNOSIS — N186 End stage renal disease: Secondary | ICD-10-CM | POA: Diagnosis present

## 2018-03-09 DIAGNOSIS — Z951 Presence of aortocoronary bypass graft: Secondary | ICD-10-CM

## 2018-03-09 DIAGNOSIS — D649 Anemia, unspecified: Secondary | ICD-10-CM

## 2018-03-09 DIAGNOSIS — Z7901 Long term (current) use of anticoagulants: Secondary | ICD-10-CM

## 2018-03-09 LAB — CBC WITH DIFFERENTIAL/PLATELET
BASOS ABS: 0 10*3/uL (ref 0.0–0.1)
BASOS PCT: 0 %
Eosinophils Absolute: 0.1 10*3/uL (ref 0.0–0.7)
Eosinophils Relative: 1 %
HCT: 21.9 % — ABNORMAL LOW (ref 39.0–52.0)
HEMOGLOBIN: 7.2 g/dL — AB (ref 13.0–17.0)
Lymphocytes Relative: 13 %
Lymphs Abs: 1.1 10*3/uL (ref 0.7–4.0)
MCH: 31 pg (ref 26.0–34.0)
MCHC: 32.9 g/dL (ref 30.0–36.0)
MCV: 94.4 fL (ref 78.0–100.0)
Monocytes Absolute: 1 10*3/uL (ref 0.1–1.0)
Monocytes Relative: 11 %
NEUTROS PCT: 75 %
Neutro Abs: 6.6 10*3/uL (ref 1.7–7.7)
Platelets: 142 10*3/uL — ABNORMAL LOW (ref 150–400)
RBC: 2.32 MIL/uL — ABNORMAL LOW (ref 4.22–5.81)
RDW: 18.3 % — ABNORMAL HIGH (ref 11.5–15.5)
WBC: 8.8 10*3/uL (ref 4.0–10.5)

## 2018-03-09 LAB — BASIC METABOLIC PANEL
ANION GAP: 14 (ref 5–15)
BUN: 17 mg/dL (ref 6–20)
CALCIUM: 7.8 mg/dL — AB (ref 8.9–10.3)
CHLORIDE: 93 mmol/L — AB (ref 101–111)
CO2: 29 mmol/L (ref 22–32)
Creatinine, Ser: 2.3 mg/dL — ABNORMAL HIGH (ref 0.61–1.24)
GFR calc non Af Amer: 27 mL/min — ABNORMAL LOW (ref >60)
GFR, EST AFRICAN AMERICAN: 31 mL/min — AB (ref >60)
Glucose, Bld: 143 mg/dL — ABNORMAL HIGH (ref 65–99)
Potassium: 2.9 mmol/L — ABNORMAL LOW (ref 3.5–5.1)
Sodium: 136 mmol/L (ref 135–145)

## 2018-03-09 LAB — HEMOGLOBIN AND HEMATOCRIT, BLOOD
HCT: 23.7 % — ABNORMAL LOW (ref 39.0–52.0)
HEMOGLOBIN: 7.9 g/dL — AB (ref 13.0–17.0)

## 2018-03-09 LAB — POC OCCULT BLOOD, ED: Fecal Occult Bld: POSITIVE — AB

## 2018-03-09 LAB — PROTIME-INR
INR: 1.97
Prothrombin Time: 22.2 seconds — ABNORMAL HIGH (ref 11.4–15.2)

## 2018-03-09 LAB — GLUCOSE, CAPILLARY: Glucose-Capillary: 150 mg/dL — ABNORMAL HIGH (ref 65–99)

## 2018-03-09 LAB — LACTATE DEHYDROGENASE: LDH: 237 U/L — AB (ref 98–192)

## 2018-03-09 LAB — PREPARE RBC (CROSSMATCH)

## 2018-03-09 MED ORDER — ONDANSETRON HCL 4 MG/2ML IJ SOLN: 4.0000 mg | Freq: Four times a day (QID) | INTRAMUSCULAR | Status: DC | PRN

## 2018-03-09 MED ORDER — POTASSIUM CHLORIDE CRYS ER 20 MEQ PO TBCR: 20.0000 meq | EXTENDED_RELEASE_TABLET | Freq: Once | ORAL | Status: DC

## 2018-03-09 MED ORDER — TRAZODONE HCL 50 MG PO TABS
100.0000 mg | ORAL_TABLET | Freq: Every evening | ORAL | Status: DC | PRN
  Administered 2018-03-09: 100 mg via ORAL
  Filled 2018-03-09: qty 2

## 2018-03-09 MED ORDER — TRAMADOL HCL 50 MG PO TABS
50.0000 mg | ORAL_TABLET | Freq: Two times a day (BID) | ORAL | Status: DC | PRN
  Administered 2018-03-09: 50 mg via ORAL
  Filled 2018-03-09: qty 1

## 2018-03-09 MED ORDER — TRAMADOL HCL 50 MG PO TABS
50.0000 mg | ORAL_TABLET | Freq: Once | ORAL | Status: AC
  Administered 2018-03-09: 50 mg via ORAL
  Filled 2018-03-09: qty 1

## 2018-03-09 MED ORDER — CALCITRIOL 0.5 MCG PO CAPS: 0.5000 ug | ORAL_CAPSULE | ORAL | Status: DC

## 2018-03-09 MED ORDER — ALBUTEROL SULFATE HFA 108 (90 BASE) MCG/ACT IN AERS: 2.0000 | INHALATION_SPRAY | RESPIRATORY_TRACT | Status: DC | PRN

## 2018-03-09 MED ORDER — PANTOPRAZOLE SODIUM 40 MG PO TBEC
40.0000 mg | DELAYED_RELEASE_TABLET | Freq: Every day | ORAL | Status: DC
  Administered 2018-03-09 – 2018-03-10 (×2): 40 mg via ORAL
  Filled 2018-03-09 (×2): qty 1

## 2018-03-09 MED ORDER — DIPHENHYDRAMINE HCL 50 MG/ML IJ SOLN
25.0000 mg | Freq: Once | INTRAMUSCULAR | Status: AC
  Administered 2018-03-09: 25 mg via INTRAVENOUS
  Filled 2018-03-09: qty 1

## 2018-03-09 MED ORDER — RENA-VITE PO TABS
1.0000 | ORAL_TABLET | Freq: Every day | ORAL | Status: DC
  Administered 2018-03-09: 1 via ORAL
  Filled 2018-03-09: qty 1

## 2018-03-09 MED ORDER — SILDENAFIL CITRATE 20 MG PO TABS
40.0000 mg | ORAL_TABLET | Freq: Three times a day (TID) | ORAL | Status: DC
  Administered 2018-03-09 – 2018-03-10 (×3): 40 mg via ORAL
  Filled 2018-03-09 (×3): qty 2

## 2018-03-09 MED ORDER — BUSPIRONE HCL 5 MG PO TABS
5.0000 mg | ORAL_TABLET | Freq: Two times a day (BID) | ORAL | Status: DC
  Administered 2018-03-09 – 2018-03-10 (×2): 5 mg via ORAL
  Filled 2018-03-09 (×3): qty 1

## 2018-03-09 MED ORDER — FERRIC CITRATE 1 GM 210 MG(FE) PO TABS
420.0000 mg | ORAL_TABLET | Freq: Three times a day (TID) | ORAL | Status: DC
Start: 2018-03-10 — End: 2018-03-10
  Administered 2018-03-10 (×3): 420 mg via ORAL
  Filled 2018-03-09 (×3): qty 2

## 2018-03-09 MED ORDER — MAGNESIUM OXIDE 400 (241.3 MG) MG PO TABS
400.0000 mg | ORAL_TABLET | Freq: Every day | ORAL | Status: DC
Start: 2018-03-10 — End: 2018-03-10
  Administered 2018-03-10: 400 mg via ORAL
  Filled 2018-03-09: qty 1

## 2018-03-09 MED ORDER — BUDESONIDE 0.5 MG/2ML IN SUSP
0.5000 mg | Freq: Two times a day (BID) | RESPIRATORY_TRACT | Status: DC
  Administered 2018-03-09 – 2018-03-10 (×2): 0.5 mg via RESPIRATORY_TRACT
  Filled 2018-03-09 (×2): qty 2

## 2018-03-09 MED ORDER — ACETAMINOPHEN 325 MG PO TABS: 650.0000 mg | ORAL_TABLET | ORAL | Status: DC | PRN

## 2018-03-09 MED ORDER — SEVELAMER CARBONATE 800 MG PO TABS
2400.0000 mg | ORAL_TABLET | Freq: Three times a day (TID) | ORAL | Status: DC
  Administered 2018-03-10: 2400 mg via ORAL
  Filled 2018-03-09: qty 3

## 2018-03-09 MED ORDER — INSULIN GLARGINE 100 UNIT/ML ~~LOC~~ SOLN
15.0000 [IU] | Freq: Every day | SUBCUTANEOUS | Status: DC
  Administered 2018-03-09: 15 [IU] via SUBCUTANEOUS
  Filled 2018-03-09 (×2): qty 0.15

## 2018-03-09 MED ORDER — SODIUM CHLORIDE 0.9 % IV SOLN
Freq: Once | INTRAVENOUS | Status: AC
  Administered 2018-03-09: 17:00:00 via INTRAVENOUS

## 2018-03-09 MED ORDER — INSULIN GLARGINE 100 UNITS/ML SOLOSTAR PEN
15.0000 [IU] | PEN_INJECTOR | Freq: Every day | SUBCUTANEOUS | Status: DC
  Filled 2018-03-09: qty 3

## 2018-03-09 MED ORDER — ATORVASTATIN CALCIUM 40 MG PO TABS: 40.0000 mg | ORAL_TABLET | Freq: Every day | ORAL | Status: DC

## 2018-03-09 MED ORDER — SEVELAMER CARBONATE 800 MG PO TABS: 1600.0000 mg | ORAL_TABLET | ORAL | Status: DC

## 2018-03-09 MED ORDER — SODIUM CHLORIDE 0.9 % IV SOLN: Freq: Once | INTRAVENOUS | Status: DC

## 2018-03-09 MED ORDER — GABAPENTIN 600 MG PO TABS
300.0000 mg | ORAL_TABLET | Freq: Two times a day (BID) | ORAL | Status: DC
  Administered 2018-03-09 – 2018-03-10 (×2): 300 mg via ORAL
  Filled 2018-03-09 (×2): qty 1

## 2018-03-09 MED ORDER — DOXYCYCLINE HYCLATE 100 MG PO TABS
100.0000 mg | ORAL_TABLET | Freq: Two times a day (BID) | ORAL | Status: DC
  Administered 2018-03-09 – 2018-03-10 (×2): 100 mg via ORAL
  Filled 2018-03-09 (×2): qty 1

## 2018-03-09 MED ORDER — LEVOTHYROXINE SODIUM 25 MCG PO TABS
25.0000 ug | ORAL_TABLET | Freq: Every day | ORAL | Status: DC
  Administered 2018-03-10: 25 ug via ORAL
  Filled 2018-03-09: qty 1

## 2018-03-09 NOTE — ED Provider Notes (Addendum)
[facility] EMERGENCY DEPARTMENT Provider Note   CSN: [id number] Arrival date & time: 03/09/18  1119     History   Chief Complaint Chief Complaint  Patient presents with  . Abnormal Lab    HPI Trevor [name] is a 72 y.o. male.  The history is provided by medical records and the patient. No language interpreter was used.  Abnormal Lab   Trevor [name] is a 72 y.o. male  with a PMH of CHF,  LVAD in place, ESRD on dialysis who presents to the Emergency Department by recommendation of LVAD team.  Patient has history of previous GI bleeding which has been evaluated by GI.  He has had a EGD and colonoscopy but area of bleeding could be reached by either approach.  He has not had bleeding in several weeks, however yesterday he noticed bright red blood.  He is on Coumadin.  He also felt much weaker than usual over the last several days.  He was seen by the LVAD team who recommended he come to emergency department for labs and likely blood transfusion.  He denies any fever or chills.  No chest pain.  He was at dialysis earlier today where they removed 1500 cc and completed most of treatment.    Past Medical History:  Diagnosis Date  . AICD (automatic cardioverter/defibrillator) present   . Asthma   . AVM (arteriovenous malformation) of colon 07/07/2017  . CHF (congestive heart failure) ([place])   . Diabetes ([place])   . ESRF (end stage renal failure) ([place]) 07/07/2017   pt receiving hemodialysis Monday- Wednesday-friday, sometimes Saturday  . Kidney disease   . LVAD (left ventricular assist device) present ([place]) 12/2016  . Permanent atrial fibrillation ([place]) 07/07/2017  . Presence of permanent cardiac pacemaker    AICD  . Sleep apnea     Patient Active Problem List   Diagnosis Date Noted  . Small bowel bleed not requiring more than 4 units of blood in 24 hours, ICU, or surgery   . Heme positive stool   . Cellulitis 12/03/2017  . 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 Chronic systolic heart failure ([facility]) 09/08/2017  . ESRD (end stage renal disease) ([place]) 07/07/2017  . AVM (arteriovenous malformation) of colon 07/07/2017  . Thrombocytopenia ([place]) 07/07/2017  . Permanent atrial fibrillation ([place]) 07/07/2017  . Anemia of chronic disease 07/07/2017  . CHF (congestive heart failure), NYHA class IV ([place]) 06/13/2017  . AVM (arteriovenous malformation) of colon with hemorrhage   . Symptomatic anemia 02/10/2017  . Type 2 diabetes mellitus ([place]) 01/17/2017  . COPD (chronic obstructive pulmonary disease) ([place]) 01/17/2017  . LVAD (left ventricular assist device) present ([place]) 01/09/2017  . Presence of left ventricular assist device (LVAD) ([place])   . Cardiorenal syndrome with renal failure 11/28/2016  . Cardiomyopathy, ischemic 11/27/2016  . Coronary artery disease 11/27/2016  . Status post aortic valve replacement 11/27/2016  . Asthma, chronic 11/27/2016  . OSA (obstructive sleep apnea) 11/27/2016    Past Surgical History:  Procedure Laterality Date  . AORTIC VALVE REPLACEMENT  2015   [place]  . APPLICATION OF A-CELL OF EXTREMITY Left 11/20/2017   Procedure: APPLICATION OF A-CELL;  Surgeon: Wallace Going, DO;  Location: [facility];  Service: Plastics;  Laterality: Left;  . AV FISTULA PLACEMENT Left 06/30/2017   Procedure: CREATION of LEFT ARM Brachiocephalic Fistula;  Surgeon: Conrad [place], MD;  Location: [facility];  Service: Vascular;  Laterality: Left;  .  CARDIAC CATHETERIZATION N/A 12/02/2016   Procedure: Right Heart Cath;  Surgeon: Larey Dresser, MD;  Location: [facility];  Service: Cardiovascular;  Laterality: N/A;  . COLONOSCOPY N/A 02/14/2017   Procedure: COLONOSCOPY;  Surgeon: Milus Banister, MD;  Location: [facility];  Service: Endoscopy;  Laterality: N/A;  . COLONOSCOPY WITH PROPOFOL N/A 02/27/2018   Procedure: COLONOSCOPY WITH PROPOFOL;  Surgeon: Jerene Bears, MD;  Location: [facility];  Service: Gastroenterology;  Laterality:  N/A;  . CORONARY ANGIOPLASTY WITH STENT PLACEMENT  2013   in [place]  . CORONARY ARTERY BYPASS GRAFT  2010   in [place]  . DIALYSIS/PERMA CATHETER INSERTION  06/30/2017   Procedure: INSERTION Dialysis Catheter;  Surgeon: Conrad [place], MD;  Location: [facility] OR;  Service: Vascular;;  . ESOPHAGOGASTRODUODENOSCOPY (EGD) WITH PROPOFOL N/A 02/27/2018   Procedure: ESOPHAGOGASTRODUODENOSCOPY (EGD) WITH PROPOFOL;  Surgeon: Jerene Bears, MD;  Location[facility] ENDOSCOPY;  Service: Gastroenterology;  Laterality: N/A;  . GIVENS CAPSULE STUDY N/A 02/27/2018   Procedure: GIVENS CAPSULE STUDY;  Surgeon: Jerene Bears, MD;  Location: [facility];  Service: Gastroenterology;  Laterality: N/A;  . IABP INSERTION N/A 12/11/2016   Procedure: IABP Insertion;  Surgeon: Larey Dresser, MD;  Location: [facility] LAB;  Service: Cardiovascular;  Laterality: N/A;  . INSERTION OF IMPLANTABLE LEFT VENTRICULAR ASSIST DEVICE N/A 12/13/2016   Procedure: INSERTION OF IMPLANTABLE LEFT VENTRICULAR ASSIST DEVICE;  Surgeon: Ivin Poot, MD;  Location: [facility];  Service: Open Heart Surgery;  Laterality: N/A;  HEARTMATE II  NITRIC OXIDE  . IR FLUORO GUIDE CV LINE LEFT  06/17/2017  . IR US GUIDE VASC ACCESS LEFT  06/17/2017  . MASS EXCISION Left 11/20/2017   Procedure: EXCISION OF LEFT NECK AND RIGHT LEG SKIN CANCER WITH A CELL PLACEMENT;  Surgeon: Wallace Going, DO;  Location: [facility];  Service: Plastics;  Laterality: Left;  Marland Kitchen MASS EXCISION Left 11/26/2017   Procedure: EXPLORATION AND EXCISION OF LEFT NECK;  Surgeon: Wallace Going, DO;  Location: [facility];  Service: Plastics;  Laterality: Left;  . RIGHT HEART CATH N/A 12/11/2016   Procedure: Right Heart Cath;  Surgeon: Larey Dresser, MD;  Location: [facility];  Service: Cardiovascular;  Laterality: N/A;  . RIGHT HEART CATH N/A 03/27/2017   Procedure: Right Heart Cath;  Surgeon: Larey Dresser, MD;  Location: [facility] CV LAB;  Service: Cardiovascular;  Laterality: N/A;    . RIGHT HEART CATH N/A 06/13/2017   Procedure: RIGHT HEART CATH;  Surgeon: Jolaine Artist, MD;  Location: [facility];  Service: Cardiovascular;  Laterality: N/A;  . TEE WITHOUT CARDIOVERSION N/A 12/13/2016   Procedure: TRANSESOPHAGEAL ECHOCARDIOGRAM (TEE);  Surgeon: Ivin Poot, MD;  Location: [facility];  Service: Open Heart Surgery;  Laterality: N/A;  . TRICUSPID VALVE REPLACEMENT N/A 12/13/2016   Procedure: TRICUSPID VALVE REPAIR WITH EDWARDS MC 3 TRICUSPID ANNULOPLASTY RING MODEL 4900 SIZE T 28;  Surgeon: Ivin Poot, MD;  Location: [facility];  Service: Open Heart Surgery;  Laterality: N/A;        Home Medications    Prior to Admission medications   Medication Sig Start Date End Date Taking? Authorizing Provider  albuterol (PROVENTIL HFA;VENTOLIN HFA) 108 (90 Base) MCG/ACT inhaler Inhale 2 puffs into the lungs every 4 (four) hours as needed for wheezing or shortness of breath. 02/10/18   Larey Dresser, MD  atorvastatin (LIPITOR) 40 MG tablet TAKE 1 TABLET(40 MG) BY MOUTH  DAILY Patient taking differently: Take 40 mg by mouth daily at 6 PM.  09/02/17   Larey Dresser, MD  busPIRone (BUSPAR) 5 MG tablet Take 1 tablet (5 mg total) by mouth 2 (two) times daily. 01/22/18   Bensimhon, Shaune Pascal, MD  calcitRIOL (ROCALTROL) 0.5 MCG capsule Take 1 capsule (0.5 mcg total) by mouth every other day. Patient taking differently: Take 0.5 mcg by mouth every Monday, Wednesday, and Friday with hemodialysis.  07/08/17   Bensimhon, Shaune Pascal, MD  danazol (DANOCRINE) 100 MG capsule Take 1 capsule (100 mg total) by mouth 2 (two) times daily. 03/05/18   Larey Dresser, MD  docusate sodium (COLACE) 100 MG capsule Take 1 capsule (100 mg total) by mouth 2 (two) times daily. Patient taking differently: Take 100 mg daily by mouth.  04/03/17   Larey Dresser, MD  doxycycline (VIBRA-TABS) 100 MG tablet Take 1 tablet (100 mg total) by mouth every 12 (twelve) hours. 03/05/18   Clegg, Amy D, NP  ferric citrate  (AURYXIA) 1 GM 210 MG(Fe) tablet Take 420 mg by mouth 3 (three) times daily with meals.    [provider]  fluticasone (FLOVENT HFA) 110 MCG/ACT inhaler Inhale 2 puffs into the lungs 2 (two) times daily. 02/10/18   Larey Dresser, MD  gabapentin (NEURONTIN) 600 MG tablet Take 0.5 tablets (300 mg total) by mouth 2 (two) times daily. 07/23/17   Larey Dresser, MD  insulin glargine (LANTUS) 100 unit/mL SOPN Inject 0.15 mLs (15 Units total) into the skin at bedtime. 03/05/18   Clegg, Amy D, NP  levothyroxine (SYNTHROID, LEVOTHROID) 25 MCG tablet Take 1 tablet (25 mcg total) by mouth daily before breakfast. 10/02/17   Larey Dresser, MD  magnesium oxide (MAG-OX) 400 MG tablet Take 400 mg by mouth daily.    [provider]  multivitamin (RENA-VIT) TABS tablet Take 1 tablet by mouth at bedtime. 09/02/17   Larey Dresser, MD  pantoprazole (PROTONIX) 40 MG tablet Take 1 tablet (40 mg total) by mouth daily. 08/11/17   Clegg, Amy D, NP  sevelamer carbonate (RENVELA) 800 MG tablet Take 1,600-2,400 mg by mouth See admin instructions. Take 2,400 mg by mouth three times a day with meals and 1,600 mg two times a day with snacks 02/03/18   [provider]  sildenafil (REVATIO) 20 MG tablet Take 2 tablets (40 mg total) 3 (three) times daily by mouth. Patient taking differently: Take 40 mg by mouth See admin instructions. Take 40 mg by mouth three times a day- midday, evening, and bedtime 09/11/17   Larey Dresser, MD  traMADol (ULTRAM) 50 MG tablet Take 1 tablet (50 mg total) by mouth every 12 (twelve) hours as needed. TAKE 1 TABLET BY MOUTH EVERY 6 HOURS AS NEEDED FOR MODERATE PAIN 03/05/18   Clegg, Amy D, NP  traZODone (DESYREL) 100 MG tablet TAKE 1 TABLET BY MOUTH EVERY NIGHT AT BEDTIME AS NEEDED FOR SLEEP Patient taking differently: Take 100 mg by mouth at bedtime 02/16/18   Larey Dresser, MD  warfarin (COUMADIN) 5 MG tablet Take 1.5 tablets (7.5 mg total) by mouth See admin  instructions. 03/05/18   Conrad Applewold, NP    Family History Family History  Problem Relation Age of Onset  . Heart failure Father   . Heart attack Father   . Healthy Mother   . Diabetes Paternal Grandfather     Social History Social History   Tobacco Use  . Smoking status:  Never Smoker  . Smokeless tobacco: Never Used  Substance Use Topics  . Alcohol use: No  . Drug use: No     Allergies   Patient has no known allergies.   Review of Systems Review of Systems  Constitutional: Positive for fatigue.  Gastrointestinal: Positive for blood in stool. Negative for abdominal pain, diarrhea, nausea and vomiting.  All other systems reviewed and are negative.    Physical Exam Updated Vital Signs BP 99/62   Pulse 68   Temp 97.6 F (36.4 C)   Resp 15   SpO2 100%   Physical Exam  Constitutional: He is oriented to person, place, and time. He appears well-developed and well-nourished. No distress.  HENT:  Head: Normocephalic and atraumatic.  Neck: Neck supple.  Cardiovascular: Normal rate, regular rhythm and normal heart sounds.  No murmur heard. Pulmonary/Chest: Effort normal. No respiratory distress.  Abdominal: Soft. He exhibits no distension. There is no tenderness.  Neurological: He is alert and oriented to person, place, and time.  Skin: Skin is warm and dry.  Nursing note and vitals reviewed.    ED Treatments / Results  Labs (all labs ordered are listed, but only abnormal results are displayed) Labs Reviewed  CBC WITH DIFFERENTIAL/PLATELET - Abnormal; Notable for the following components:      Result Value   RBC 2.32 (*)    Hemoglobin 7.2 (*)    HCT 21.9 (*)    RDW 18.3 (*)    Platelets 142 (*)    All other components within normal limits  BASIC METABOLIC PANEL - Abnormal; Notable for the following components:   Potassium 2.9 (*)    Chloride 93 (*)    Glucose, Bld 143 (*)    Creatinine, Ser 2.30 (*)    Calcium 7.8 (*)    GFR calc non Af Amer 27 (*)      GFR calc Af Amer 31 (*)    All other components within normal limits  LACTATE DEHYDROGENASE - Abnormal; Notable for the following components:   LDH 237 (*)    All other components within normal limits  PROTIME-INR - Abnormal; Notable for the following components:   Prothrombin Time 22.2 (*)    All other components within normal limits  POC OCCULT BLOOD, ED - Abnormal; Notable for the following components:   Fecal Occult Bld POSITIVE (*)    All other components within normal limits  TYPE AND SCREEN  PREPARE RBC (CROSSMATCH)    EKG None  Radiology Dg Chest Portable 1 View  Result Date: 03/09/2018 CLINICAL DATA:  LVAD EXAM: PORTABLE CHEST 1 VIEW COMPARISON:  02/28/2018 FINDINGS: Cardiopericardial enlargement is stable. There are changes of CABG, aortic valve replacement, and mitral valve repair. An LVAD and ICD are in place. There is no edema, consolidation, effusion, or pneumothorax. IMPRESSION: No evidence of active disease.  Stable from prior. Electronically Signed   By: Monte Fantasia M.D.   On: 03/09/2018 12:09    Procedures Procedures (including critical care time)  CRITICAL CARE Performed by: Ozella Almond Ward   Total critical care time: 40 minutes  Critical care time was exclusive of separately billable procedures and treating other patients.  Critical care was necessary to treat or prevent imminent or life-threatening deterioration.  Critical care was time spent personally by me on the following activities: development of treatment plan with patient and/or surrogate as well as nursing, discussions with consultants, evaluation of patient's response to treatment, examination of patient, obtaining history from patient or surrogate,  ordering and performing treatments and interventions, ordering and review of laboratory studies, ordering and review of radiographic studies, pulse oximetry and re-evaluation of patient's condition.   Medications Ordered in ED Medications   0.9 %  sodium chloride infusion (has no administration in time range)  0.9 %  sodium chloride infusion (has no administration in time range)  diphenhydrAMINE (BENADRYL) injection 25 mg (25 mg Intravenous Given 03/09/18 1324)  traMADol (ULTRAM) tablet 50 mg (50 mg Oral Given 03/09/18 1430)     Initial Impression / Assessment and Plan / ED Course  I have reviewed the triage vital signs and the nursing notes.  Pertinent labs & imaging results that were available during my care of the patient were reviewed by me and considered in my medical decision making (see chart for details).    Trevor [name] is a 72 y.o. male who presents to ED by recommendation of LVAD team. Patient with hx of GI bleeding. Has not had bleeding in a few weeks, but noticed BRBPR today. Associated with weakness. Hemoccult +. Hgb decreased at 7.2 - Has been trending downward for the last several days (8.5 last week). Please see cardiology/VAD note for full recommendations. They would like to give 1U blood in ED. Will then call to update team, possibly will receive another unit in ED. Disposition pending VAD update and if patient's symptoms are improving.    Patient not feeling much improved after 1U blood. Evaluated by VAD team who will admit for further care.   Patient seen by and discussed with Dr. Eulis Foster who agrees with treatment plan.    Final Clinical Impressions(s) / ED Diagnoses   Final diagnoses:  Cough  Symptomatic anemia  Gastrointestinal hemorrhage, unspecified gastrointestinal hemorrhage type    ED Discharge Orders    None       Ward, Ozella Almond, PA-C 03/09/18 1621    Ward, Ozella Almond, PA-C 03/09/18 1622    Daleen Bo, MD 03/10/18 [phone]

## 2018-03-09 NOTE — Consult Note (Addendum)
[facility] Consult: 4:22 PM 03/09/2018  LOS: 0 days    Referring Provider: Dr   Primary Care Physician:  Janith Lima, MD Primary Gastroenterologist:  Dr. Ardis Hughs.       Reason for Consultation:  GIB and anemia.     HPI: Trevor [name] is a 72 y.o. male.  PMH COPD.  ESRD on HD.  Cardiomyopathy, CHF.  S/p LVAD 01/01/18.  S/p TAVR.  Chronic A fib.  On chronic Coumadin.  Anemia.  GIBs.   02/2017 colonoscopy.  Blood throughout colon, but not in TI.  Oozing tiny AVMS at hepatic flexure, treated with APC, endoclips.  Three small small polyps not removed. Left colon tics.     Seen 02/26/18 by GI Dr Hilarie Fredrickson for black stools, acute on chronic anemia. 02/27/18 EGD.  Normal, no bleeding.     02/27/18 Colonoscopy.    Blood in TI, sugg of SB source.  Blood throughout colon, no source found.  Descending and sigmoid tics.  Multiple small polyps of ascending to sigmoid, not removed.   02/28/18 Capsule endo.  Fresh blood at ileum, starting at 6 hours 58 mins.  Blood obscured identification of lesion.  Blood throughout colon. Per Dr Marin Shutter "Ileum (where bleeding) not reachable locally, and likely not reachable by anterograde double balloon enteroscopy at [facility]". Transfused 5 U PRBCs during admission.   At discharge contd on monthly octreotide and started Doxycycline 100 bid to try to limit AVM bleeding (and for URI sx) with planned transition to danazol.  Goal INR decreased to 1.8 - 2.3.    Developed recurrent weakness late last week, malaise over weekend.   Sleeping a lot.  Stools never stopped being dark, but on Sat he noticed bloody stool, these persisted.  Some dizziness, SOB.  Persistent cough from URI.  No anorexia, nausea, abd pain.    Hgb has drifted from 9.2 to 7.2  from 4/28 >> today.  Got 1 U PRBC so far.   Platelets 142,  though low of 87 on 4/29.   INR 1.9  Past Medical History:  Diagnosis Date  . AICD (automatic cardioverter/defibrillator) present   . Asthma   . AVM (arteriovenous malformation) of colon 07/07/2017  . CHF (congestive heart failure) ([place])   . Diabetes ([place])   . ESRF (end stage renal failure) ([place]) 07/07/2017   pt receiving hemodialysis Monday- Wednesday-friday, sometimes Saturday  . Kidney disease   . LVAD (left ventricular assist device) present ([place]) 12/2016  . Permanent atrial fibrillation ([place]) 07/07/2017  . Presence of permanent cardiac pacemaker    AICD  . Sleep apnea     Past Surgical History:  Procedure Laterality Date  . AORTIC VALVE REPLACEMENT  2015   [place]  . APPLICATION OF A-CELL OF EXTREMITY Left 11/20/2017   Procedure: APPLICATION OF A-CELL;  Surgeon: Wallace Going, DO;  Location: [place];  Service: Plastics;  Laterality: Left;  . AV FISTULA PLACEMENT Left 06/30/2017   Procedure: CREATION of LEFT ARM Brachiocephalic Fistula;  Surgeon: Conrad [place], MD;  Location: [place];  Service: Vascular;  Laterality: Left;  . CARDIAC CATHETERIZATION N/A 12/02/2016   Procedure: Right Heart Cath;  Surgeon: Larey Dresser, MD;  Location: [place] CV LAB;  Service: Cardiovascular;  Laterality: N/A;  . COLONOSCOPY N/A 02/14/2017   Procedure: COLONOSCOPY;  Surgeon: Milus Banister, MD;  Location: [place];  Service: Endoscopy;  Laterality: N/A;  . COLONOSCOPY WITH PROPOFOL N/A 02/27/2018   Procedure: COLONOSCOPY WITH PROPOFOL;  Surgeon: Jerene Bears, MD;  Location: [place];  Service: Gastroenterology;  Laterality: N/A;  . CORONARY ANGIOPLASTY WITH STENT PLACEMENT  2013   in [place]  . CORONARY ARTERY BYPASS GRAFT  2010   in [place]  . DIALYSIS/PERMA CATHETER INSERTION  06/30/2017   Procedure: INSERTION Dialysis Catheter;  Surgeon: Conrad Thurmont, MD;  Location: [facility] OR;  Service: Vascular;;  . ESOPHAGOGASTRODUODENOSCOPY (EGD) WITH PROPOFOL N/A 02/27/2018   Procedure:  ESOPHAGOGASTRODUODENOSCOPY (EGD) WITH PROPOFOL;  Surgeon: Jerene Bears, MD;  Location: [facility] ENDOSCOPY;  Service: Gastroenterology;  Laterality: N/A;  . GIVENS CAPSULE STUDY N/A 02/27/2018   Procedure: GIVENS CAPSULE STUDY;  Surgeon: Jerene Bears, MD;  Location: [place];  Service: Gastroenterology;  Laterality: N/A;  . IABP INSERTION N/A 12/11/2016   Procedure: IABP Insertion;  Surgeon: Larey Dresser, MD;  Location: [place] CV LAB;  Service: Cardiovascular;  Laterality: N/A;  . INSERTION OF IMPLANTABLE LEFT VENTRICULAR ASSIST DEVICE N/A 12/13/2016   Procedure: INSERTION OF IMPLANTABLE LEFT VENTRICULAR ASSIST DEVICE;  Surgeon: Ivin Poot, MD;  Location: [place];  Service: Open Heart Surgery;  Laterality: N/A;  HEARTMATE II  NITRIC OXIDE  . IR FLUORO GUIDE CV LINE LEFT  06/17/2017  . IR US GUIDE VASC ACCESS LEFT  06/17/2017  . MASS EXCISION Left 11/20/2017   Procedure: EXCISION OF LEFT NECK AND RIGHT LEG SKIN CANCER WITH A CELL PLACEMENT;  Surgeon: Wallace Going, DO;  Location: [place];  Service: Plastics;  Laterality: Left;  Marland Kitchen MASS EXCISION Left 11/26/2017   Procedure: EXPLORATION AND EXCISION OF LEFT NECK;  Surgeon: Wallace Going, DO;  Location: [place];  Service: Plastics;  Laterality: Left;  . RIGHT HEART CATH N/A 12/11/2016   Procedure: Right Heart Cath;  Surgeon: Larey Dresser, MD;  Location: [place] CV LAB;  Service: Cardiovascular;  Laterality: N/A;  . RIGHT HEART CATH N/A 03/27/2017   Procedure: Right Heart Cath;  Surgeon: Larey Dresser, MD;  Location: [place] CV LAB;  Service: Cardiovascular;  Laterality: N/A;  . RIGHT HEART CATH N/A 06/13/2017   Procedure: RIGHT HEART CATH;  Surgeon: Jolaine Artist, MD;  Location: [place] CV LAB;  Service: Cardiovascular;  Laterality: N/A;  . TEE WITHOUT CARDIOVERSION N/A 12/13/2016   Procedure: TRANSESOPHAGEAL ECHOCARDIOGRAM (TEE);  Surgeon: Ivin Poot, MD;  Location: [place];  Service: Open Heart Surgery;  Laterality:  N/A;  . TRICUSPID VALVE REPLACEMENT N/A 12/13/2016   Procedure: TRICUSPID VALVE REPAIR WITH EDWARDS MC 3 TRICUSPID ANNULOPLASTY RING MODEL 4900 SIZE T 28;  Surgeon: Ivin Poot, MD;  Location: [place];  Service: Open Heart Surgery;  Laterality: N/A;    Prior to Admission medications   Medication Sig Start Date End Date Taking? Authorizing Provider  albuterol (PROVENTIL HFA;VENTOLIN HFA) 108 (90 Base) MCG/ACT inhaler Inhale 2 puffs into the lungs every 4 (four) hours as needed for wheezing or shortness of breath. 02/10/18   Larey Dresser, MD  atorvastatin (LIPITOR) 40 MG tablet TAKE 1 TABLET(40 MG) BY MOUTH DAILY Patient taking  differently: Take 40 mg by mouth daily at 6 PM.  09/02/17   Larey Dresser, MD  busPIRone (BUSPAR) 5 MG tablet Take 1 tablet (5 mg total) by mouth 2 (two) times daily. 01/22/18   Bensimhon, Shaune Pascal, MD  calcitRIOL (ROCALTROL) 0.5 MCG capsule Take 1 capsule (0.5 mcg total) by mouth every other day. Patient taking differently: Take 0.5 mcg by mouth every Monday, Wednesday, and Friday with hemodialysis.  07/08/17   Bensimhon, Shaune Pascal, MD  danazol (DANOCRINE) 100 MG capsule Take 1 capsule (100 mg total) by mouth 2 (two) times daily. 03/05/18   Larey Dresser, MD  docusate sodium (COLACE) 100 MG capsule Take 1 capsule (100 mg total) by mouth 2 (two) times daily. Patient taking differently: Take 100 mg daily by mouth.  04/03/17   Larey Dresser, MD  doxycycline (VIBRA-TABS) 100 MG tablet Take 1 tablet (100 mg total) by mouth every 12 (twelve) hours. 03/05/18   Clegg, Amy D, NP  ferric citrate (AURYXIA) 1 GM 210 MG(Fe) tablet Take 420 mg by mouth 3 (three) times daily with meals.    [provider]  fluticasone (FLOVENT HFA) 110 MCG/ACT inhaler Inhale 2 puffs into the lungs 2 (two) times daily. 02/10/18   Larey Dresser, MD  gabapentin (NEURONTIN) 600 MG tablet Take 0.5 tablets (300 mg total) by mouth 2 (two) times daily. 07/23/17   Larey Dresser, MD  insulin glargine  (LANTUS) 100 unit/mL SOPN Inject 0.15 mLs (15 Units total) into the skin at bedtime. 03/05/18   Clegg, Amy D, NP  levothyroxine (SYNTHROID, LEVOTHROID) 25 MCG tablet Take 1 tablet (25 mcg total) by mouth daily before breakfast. 10/02/17   Larey Dresser, MD  magnesium oxide (MAG-OX) 400 MG tablet Take 400 mg by mouth daily.    [provider]  multivitamin (RENA-VIT) TABS tablet Take 1 tablet by mouth at bedtime. 09/02/17   Larey Dresser, MD  pantoprazole (PROTONIX) 40 MG tablet Take 1 tablet (40 mg total) by mouth daily. 08/11/17   Clegg, Amy D, NP  sevelamer carbonate (RENVELA) 800 MG tablet Take 1,600-2,400 mg by mouth See admin instructions. Take 2,400 mg by mouth three times a day with meals and 1,600 mg two times a day with snacks 02/03/18   [provider]  sildenafil (REVATIO) 20 MG tablet Take 2 tablets (40 mg total) 3 (three) times daily by mouth. Patient taking differently: Take 40 mg by mouth See admin instructions. Take 40 mg by mouth three times a day- midday, evening, and bedtime 09/11/17   Larey Dresser, MD  traMADol (ULTRAM) 50 MG tablet Take 1 tablet (50 mg total) by mouth every 12 (twelve) hours as needed. TAKE 1 TABLET BY MOUTH EVERY 6 HOURS AS NEEDED FOR MODERATE PAIN 03/05/18   Clegg, Amy D, NP  traZODone (DESYREL) 100 MG tablet TAKE 1 TABLET BY MOUTH EVERY NIGHT AT BEDTIME AS NEEDED FOR SLEEP Patient taking differently: Take 100 mg by mouth at bedtime 02/16/18   Larey Dresser, MD  warfarin (COUMADIN) 5 MG tablet Take 1.5 tablets (7.5 mg total) by mouth See admin instructions. 03/05/18   Clegg, Amy D, NP    Scheduled Meds:  Infusions: . sodium chloride    . sodium chloride     PRN Meds:    Allergies as of 03/09/2018  . (No Known Allergies)    Family History  Problem Relation Age of Onset  . Heart failure Father   . Heart attack  Father   . Healthy Mother   . Diabetes Paternal Grandfather     Social History   Socioeconomic History  .  Marital status: Widowed    Spouse name: Not on file  . Number of children: 0  . Years of education: 51  . Highest education level: Not on file  Occupational History  . Not on file  Social Needs  . Financial resource strain: Not on file  . Food insecurity:    Worry: Not on file    Inability: Not on file  . Transportation needs:    Medical: Not on file    Non-medical: Not on file  Tobacco Use  . Smoking status: Never Smoker  . Smokeless tobacco: Never Used  Substance and Sexual Activity  . Alcohol use: No  . Drug use: No  . Sexual activity: Not Currently  Lifestyle  . Physical activity:    Days per week: Not on file    Minutes per session: Not on file  . Stress: Not on file  Relationships  . Social connections:    Talks on phone: Not on file    Gets together: Not on file    Attends religious service: Not on file    Active member of club or organization: Not on file    Attends meetings of clubs or organizations: Not on file    Relationship status: Not on file  . Intimate partner violence:    Fear of current or ex partner: Not on file    Emotionally abused: Not on file    Physically abused: Not on file    Forced sexual activity: Not on file  Other Topics Concern  . Not on file  Social History Narrative   Patient is a widow. Moved to [place] from [place], [place]. Currently lives with his sister Ganon Demasi.    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     REVIEW OF SYSTEMS: Constitutional:  Per HPI ENT:  No nose bleeds Pulm:  Per HPI CV:  No palpitations, no LE edema.  No chest pain.  GU:  No hematuria, no frequency GI:  See HPI Heme: Other than the bloody stool.  He has not noticed any nosebleeding or other unusual bleeding. Transfusions:  See HPI Neuro:  No headaches, no peripheral tingling or numbness Derm:  No itching, no rash or sores.  Endocrine:  No sweats or chills.  No polyuria or dysuria Immunization: reviewed.   Travel:  None beyond  local counties in last few months.    PHYSICAL EXAM: Vital signs in last 24 hours: Vitals:   03/09/18 1400 03/09/18 1414  BP: 99/62   Pulse:    Resp: 15   Temp:  97.6 F (36.4 C)  SpO2:     Wt Readings from Last 3 Encounters:  03/05/18 184 lb (83.5 kg)  02/12/18 180 lb (81.6 kg)  02/10/18 192 lb 9.6 oz (87.4 kg)    General: A little bit pale.  Does not look acutely ill.  Comfortable.  Overweight. Head: No facial asymmetry or swelling.  No signs of head trauma. Eyes: No scleral icterus.  Conjunctiva not pale. Ears: Not hard of hearing. Nose: No congestion, no discharge. Mouth: Oropharynx moist, clear.  Tongue midline. Neck: No JVD, no masses, no thyromegaly. Lungs: Clear bilaterally.  Slight cough. Heart: VAD hum. Abdomen: Soft.  Nontender.  No masses, HSM, bruits, hernias.  Bowel sounds active..   Rectal: Deferred rectal exam. Musc/Skeltl: No joint redness or swelling.  Extremities: Trace edema in the feet. Neurologic: Pleasant.  Oriented x3.  Fully alert.  Moves all 4 limbs.  No tremors, no gross weakness or deficits. Skin: No sores or rashes. Nodes: Cervical adenopathy. Psych: Calm, cooperative, affect and mood normal  Intake/Output from previous day: No intake/output data recorded. Intake/Output this shift: No intake/output data recorded.  LAB RESULTS: Recent Labs    03/09/18 1156  WBC 8.8  HGB 7.2*  HCT 21.9*  PLT 142*   BMET Lab Results  Component Value Date   NA 136 03/09/2018   NA 136 03/05/2018   NA 136 03/04/2018   K 2.9 (L) 03/09/2018   K 4.0 03/05/2018   K 4.3 03/04/2018   CL 93 (L) 03/09/2018   CL 98 (L) 03/05/2018   CL 99 (L) 03/04/2018   CO2 29 03/09/2018   CO2 32 03/05/2018   CO2 30 03/04/2018   GLUCOSE 143 (H) 03/09/2018   GLUCOSE 168 (H) 03/05/2018   GLUCOSE 114 (H) 03/04/2018   BUN 17 03/09/2018   BUN 18 03/05/2018   BUN 29 (H) 03/04/2018   CREATININE 2.30 (H) 03/09/2018   CREATININE 2.78 (H) 03/05/2018   CREATININE 3.52 (H)  03/04/2018   CALCIUM 7.8 (L) 03/09/2018   CALCIUM 8.4 (L) 03/05/2018   CALCIUM 8.6 (L) 03/04/2018   LFT No results for input(s): PROT, ALBUMIN, AST, ALT, ALKPHOS, BILITOT, BILIDIR, IBILI in the last 72 hours. PT/INR Lab Results  Component Value Date   INR 1.97 03/09/2018   INR 2.11 03/05/2018   INR 1.66 03/04/2018   Hepatitis Panel No results for input(s): HEPBSAG, HCVAB, HEPAIGM, HEPBIGM in the last 72 hours. C-Diff No components found for: CDIFF Lipase  No results found for: LIPASE  Drugs of Abuse     Component Value Date/Time   LABOPIA NONE DETECTED 12/03/2016 1720   COCAINSCRNUR NONE DETECTED 12/03/2016 1720   LABBENZ NONE DETECTED 12/03/2016 1720   AMPHETMU NONE DETECTED 12/03/2016 1720   THCU NONE DETECTED 12/03/2016 1720   LABBARB NONE DETECTED 12/03/2016 1720     RADIOLOGY STUDIES: Dg Chest Portable 1 View  Result Date: 03/09/2018 CLINICAL DATA:  LVAD EXAM: PORTABLE CHEST 1 VIEW COMPARISON:  02/28/2018 FINDINGS: Cardiopericardial enlargement is stable. There are changes of CABG, aortic valve replacement, and mitral valve repair. An LVAD and ICD are in place. There is no edema, consolidation, effusion, or pneumothorax. IMPRESSION: No evidence of active disease.  Stable from prior. Electronically Signed   By: Monte Fantasia M.D.   On: 03/09/2018 12:09     IMPRESSION:   *   GI bleeding and recurrent blood loss anemia from ileal source per workup 10 d ago.     *   LVAD pt with AFIB, chronic Coumadin.  INR therapeutic  *   ESRD    PLAN:     *  Look into transfer to [facility] for advanced endoscopy per d/w Dr Silverio Decamp.   *   Heart healthy diet.  *    Decisions regarding further transfusion per LVAD team.     Azucena Freed  03/09/2018, 4:22 PM Phone [phone]   Attending physician's note   I have taken a history, examined the patient and reviewed the chart. I agree with the Advanced Practitioner's note, impression and recommendations.  17 yr M  ESRD, s/p  LVAD, TVAR, chronic A fib on coumadin was recently hospitalized a week ago with melena, underwent EGD, colonoscopy and small bowel video capsule. Evidence of active bleeding in the  ileum.  Patient admitted with worsening anemia and ongoing melena. Hgb 7.2. INR 1.9 He failed conservative management with [place] octreotide Patient will need retrograde approach double balloon enteroscopy, not available here and will discuss with [name].  Will also discuss with LVAD/heartfailure team to see if patient can be transferred to [facility] Continue supportive care Monitor Hgb and transfuse as needed  K. Denzil Magnuson , MD [phone]    Addendum  Called [name] transfer [phone], GI oncall Dr Cephas Darby accepted the patient for retrograde double balloon enteroscopy. Informed Dr Aundra Dubin.

## 2018-03-09 NOTE — Progress Notes (Signed)
Pt completing first unit of blood. Oda Kilts, PA in to assess - will plan on admitting pt for observation with dialysis tomorrow.   Existing VAD dressing removed and site care performed using sterile technique. Drive line exit site cleaned with Chlora prep applicators x 2, rinsed with saline and Sorbaview dressing WITHOUT bio patch re-applied; no skin prep used due to skin irritation.  Exit site healed and incorporated, the velour is fully implanted at exit site. No redness, tenderness, drainage, foul odor or rash noted. Drive line anchor re-applied. Pt denies fever or chills.   [name] RN, VAD Coordinator 24/7 VAD pager: [phone]

## 2018-03-09 NOTE — Telephone Encounter (Signed)
Pt called VAD pager to report he is at dialysis center and "not doing good". Is c/o fatigue, lightheadedness, weakness. Reports he has had bloody stools (started Saturday); two stools daily with bright red blood. VAD parameters wnl. Spoke with dialysis nurse, they are having difficult time getting BP this am.  [name] updated - called dialysis nurse and ask for stat CBC and transfuse 2 units of blood. Unit unable to perform either of these. They are planning on sending pt to ED here.  I contacted Short Stay and [facility] Sickle Cell units - unable to schedule OP blood transfusions before Thursday.   I contacted [facility] ED charge nurse of pt's arrival via EMS.   Met patient in ED and took cart over.   Vital signs: Doppler Pressure: 100 Automatic BP:  86/66 (73) O2 Sat: 99% on RA   LVAD interrogation reveals:  Speed: 9600 Flow: 6.4 Power:  6.8 PI: 3.5 Alarms: none Events:  80 today; >100 PI events 03/07/18 Fixed speed: 9600 Low speed limit: 9000  Pt awake, alert, with frequent non productive cough. Contacted dialysis center - pt had 1.5 Liters drawn off today. Updated ED PA. Gave her Dr. Claris Gladden contact information.   Informed ED nurse to call VAD pager if any issues.  [name] RN, VAD Coordinator 24/7 VAD Pager: [phone]

## 2018-03-09 NOTE — ED Notes (Signed)
First unit completed. LVAD coordinator paged

## 2018-03-09 NOTE — Progress Notes (Signed)
ANTICOAGULATION CONSULT NOTE - Initial Consult  Pharmacy Consult for warfarin Indication: LVAD  No Known Allergies  Patient Measurements:    Vital Signs: Temp: 98.9 F (37.2 C) (05/06 1705) Temp Source: Oral (05/06 1223) BP: 81/31 (05/06 1705) Pulse Rate: 65 (05/06 1705)  Labs: Recent Labs    03/09/18 1156  HGB 7.2*  HCT 21.9*  PLT 142*  LABPROT 22.2*  INR 1.97  CREATININE 2.30*    Estimated Creatinine Clearance: 28.9 mL/min (A) (by C-G formula based on SCr of 2.3 mg/dL (H)).   Medical History: Past Medical History:  Diagnosis Date  . AICD (automatic cardioverter/defibrillator) present   . Asthma   . AVM (arteriovenous malformation) of colon 07/07/2017  . CHF (congestive heart failure) ([place])   . Diabetes ([place])   . ESRF (end stage renal failure) ([place]) 07/07/2017   pt receiving hemodialysis Monday- Wednesday-friday, sometimes Saturday  . Kidney disease   . LVAD (left ventricular assist device) present ([place]) 12/2016  . Permanent atrial fibrillation ([place]) 07/07/2017  . Presence of permanent cardiac pacemaker    AICD  . Sleep apnea     Assessment: 57 yoM s/p LVAD admitted with recurrent GIB. Pt on warfarin PTA with last dose 5/5. INR therapeutic at 1.9 on admit. Per VAD team, hold warfarin tonight.  Goal of Therapy:  INR 1.8-2.3 Monitor platelets by anticoagulation protocol: Yes   Plan:  -No warfarin tonight -Daily INR -F/U plans  Arrie Senate, PharmD, BCPS PGY-2 Cardiology Pharmacy Resident Pager: [phone] 03/09/2018

## 2018-03-09 NOTE — ED Triage Notes (Signed)
Pt here from MD office with c/o low hgb for possible blood transfusion pt is an lvad pt

## 2018-03-09 NOTE — ED Provider Notes (Signed)
  Face-to-face evaluation   History: He presents for evaluation of GI bleeding with low hemoglobin.  He complains of pain in his feet.  He has no GI bleeding, small bowel, not amenable to intervention. Physical exam: Alert, calm, cooperative.  Lungs clear anteriorly.  Abdomen soft.  Feet warm to touch, without erythema or rash.  Medical screening examination/treatment/procedure(s) were conducted as a shared visit with non-physician practitioner(s) and myself.  I personally evaluated the patient during the encounter    Daleen Bo, MD 03/10/18 [phone]

## 2018-03-09 NOTE — H&P (Addendum)
Advanced Heart Failure VAD History and Physical Note   PCP-Cardiologist: No primary care provider on file.   Reason for Admission:  GI bleed  HPI:    Trevor [name] is a 72 y.o. male with a history of CAD s/p CABG x 4 2010, OSA, AS with TAVR 2015, ,ESRD on HD,DM2,chronicatrial fibrillation, asthma, GI bleed, and ischemic cardiomyopathy with Medtronic ICD.S/P HMII LVAD for Destination therapy on 12/14/2015.  Admitted 4/22 - 03/05/18 with GI bleeding and Hgb 7.7 on admit. Received total of 5 uPRBCs.  Underwent capsule Endo 02/27/18 which showed active bleeding, with no source found. Thought to be in cecum. Felt not to be reachable by double balloon per Dr. Hilarie Fredrickson. Continue to have blood streaking in BM but no gross melena. Transitioned to danazol for outpatient use and continued on monthly octreotide.   VAD coordinator received call from dialysis center this am that patient was feeling bad again, with worsening melena over the weekend. Brought to [facility] for further evaluation and possible admission.   Pt remains weak and tired. Had progressive darkening of stools with BRBPR this am. He only tolerated a punctuated session of HD this am with hypotension. FOBT positive on arrival. Only got 1.5 L off with HD this am.     Hgb 7.2 INR 1.97   LVAD Interrogation HM 2: Speed: 9600 Flow: 6.7 PI: 3.1 Power: 6.9. > 200 PI events  Review of systems complete and found to be negative unless listed in HPI.    Home Medications Prior to Admission medications   Medication Sig Start Date End Date Taking? Authorizing Provider  albuterol (PROVENTIL HFA;VENTOLIN HFA) 108 (90 Base) MCG/ACT inhaler Inhale 2 puffs into the lungs every 4 (four) hours as needed for wheezing or shortness of breath. 02/10/18   Larey Dresser, MD  atorvastatin (LIPITOR) 40 MG tablet TAKE 1 TABLET(40 MG) BY MOUTH DAILY Patient taking differently: Take 40 mg by mouth daily at 6 PM.  09/02/17   Larey Dresser, MD  busPIRone  (BUSPAR) 5 MG tablet Take 1 tablet (5 mg total) by mouth 2 (two) times daily. 01/22/18   Bensimhon, Shaune Pascal, MD  calcitRIOL (ROCALTROL) 0.5 MCG capsule Take 1 capsule (0.5 mcg total) by mouth every other day. Patient taking differently: Take 0.5 mcg by mouth every Monday, Wednesday, and Friday with hemodialysis.  07/08/17   Bensimhon, Shaune Pascal, MD  danazol (DANOCRINE) 100 MG capsule Take 1 capsule (100 mg total) by mouth 2 (two) times daily. 03/05/18   Larey Dresser, MD  docusate sodium (COLACE) 100 MG capsule Take 1 capsule (100 mg total) by mouth 2 (two) times daily. Patient taking differently: Take 100 mg daily by mouth.  04/03/17   Larey Dresser, MD  doxycycline (VIBRA-TABS) 100 MG tablet Take 1 tablet (100 mg total) by mouth every 12 (twelve) hours. 03/05/18   Clegg, Amy D, NP  ferric citrate (AURYXIA) 1 GM 210 MG(Fe) tablet Take 420 mg by mouth 3 (three) times daily with meals.    [provider]  fluticasone (FLOVENT HFA) 110 MCG/ACT inhaler Inhale 2 puffs into the lungs 2 (two) times daily. 02/10/18   Larey Dresser, MD  gabapentin (NEURONTIN) 600 MG tablet Take 0.5 tablets (300 mg total) by mouth 2 (two) times daily. 07/23/17   Larey Dresser, MD  insulin glargine (LANTUS) 100 unit/mL SOPN Inject 0.15 mLs (15 Units total) into the skin at bedtime. 03/05/18   Clegg, Amy D, NP  levothyroxine (SYNTHROID, LEVOTHROID) 25  MCG tablet Take 1 tablet (25 mcg total) by mouth daily before breakfast. 10/02/17   Larey Dresser, MD  magnesium oxide (MAG-OX) 400 MG tablet Take 400 mg by mouth daily.    [provider]  multivitamin (RENA-VIT) TABS tablet Take 1 tablet by mouth at bedtime. 09/02/17   Larey Dresser, MD  pantoprazole (PROTONIX) 40 MG tablet Take 1 tablet (40 mg total) by mouth daily. 08/11/17   Clegg, Amy D, NP  sevelamer carbonate (RENVELA) 800 MG tablet Take 1,600-2,400 mg by mouth See admin instructions. Take 2,400 mg by mouth three times a day with meals and 1,600 mg two  times a day with snacks 02/03/18   [provider]  sildenafil (REVATIO) 20 MG tablet Take 2 tablets (40 mg total) 3 (three) times daily by mouth. Patient taking differently: Take 40 mg by mouth See admin instructions. Take 40 mg by mouth three times a day- midday, evening, and bedtime 09/11/17   Larey Dresser, MD  traMADol (ULTRAM) 50 MG tablet Take 1 tablet (50 mg total) by mouth every 12 (twelve) hours as needed. TAKE 1 TABLET BY MOUTH EVERY 6 HOURS AS NEEDED FOR MODERATE PAIN 03/05/18   Clegg, Amy D, NP  traZODone (DESYREL) 100 MG tablet TAKE 1 TABLET BY MOUTH EVERY NIGHT AT BEDTIME AS NEEDED FOR SLEEP Patient taking differently: Take 100 mg by mouth at bedtime 02/16/18   Larey Dresser, MD  warfarin (COUMADIN) 5 MG tablet Take 1.5 tablets (7.5 mg total) by mouth See admin instructions. 03/05/18   Conrad Geraldine, NP    Past Medical History: Past Medical History:  Diagnosis Date  . AICD (automatic cardioverter/defibrillator) present   . Asthma   . AVM (arteriovenous malformation) of colon 07/07/2017  . CHF (congestive heart failure) ([place])   . Diabetes ([place])   . ESRF (end stage renal failure) ([place]) 07/07/2017   pt receiving hemodialysis Monday- Wednesday-friday, sometimes Saturday  . Kidney disease   . LVAD (left ventricular assist device) present ([place]) 12/2016  . Permanent atrial fibrillation ([place]) 07/07/2017  . Presence of permanent cardiac pacemaker    AICD  . Sleep apnea     Past Surgical History: Past Surgical History:  Procedure Laterality Date  . AORTIC VALVE REPLACEMENT  2015   [place]  . APPLICATION OF A-CELL OF EXTREMITY Left 11/20/2017   Procedure: APPLICATION OF A-CELL;  Surgeon: Wallace Going, DO;  Location: [place];  Service: Plastics;  Laterality: Left;  . AV FISTULA PLACEMENT Left 06/30/2017   Procedure: CREATION of LEFT ARM Brachiocephalic Fistula;  Surgeon: Conrad [place], MD;  Location: [place];  Service: Vascular;  Laterality: Left;  . CARDIAC CATHETERIZATION  N/A 12/02/2016   Procedure: Right Heart Cath;  Surgeon: Larey Dresser, MD;  Location: [place] CV [facility];  Service: Cardiovascular;  Laterality: N/A;  . COLONOSCOPY N/A 02/14/2017   Procedure: COLONOSCOPY;  Surgeon: Milus Banister, MD;  Location: [place];  Service: Endoscopy;  Laterality: N/A;  . COLONOSCOPY WITH PROPOFOL N/A 02/27/2018   Procedure: COLONOSCOPY WITH PROPOFOL;  Surgeon: Jerene Bears, MD;  Location: [place];  Service: Gastroenterology;  Laterality: N/A;  . CORONARY ANGIOPLASTY WITH STENT PLACEMENT  2013   in [place]  . CORONARY ARTERY BYPASS GRAFT  2010   in [place]  . DIALYSIS/PERMA CATHETER INSERTION  06/30/2017   Procedure: INSERTION Dialysis Catheter;  Surgeon: Conrad [place], MD;  Location: [facility] OR;  Service: Vascular;;  . ESOPHAGOGASTRODUODENOSCOPY (EGD) WITH PROPOFOL N/A  02/27/2018   Procedure: ESOPHAGOGASTRODUODENOSCOPY (EGD) WITH PROPOFOL;  Surgeon: Jerene Bears, MD;  Location: [facility] ENDOSCOPY;  Service: Gastroenterology;  Laterality: N/A;  . GIVENS CAPSULE STUDY N/A 02/27/2018   Procedure: GIVENS CAPSULE STUDY;  Surgeon: Jerene Bears, MD;  Location: [place];  Service: Gastroenterology;  Laterality: N/A;  . IABP INSERTION N/A 12/11/2016   Procedure: IABP Insertion;  Surgeon: Larey Dresser, MD;  Location: [place] [facility];  Service: Cardiovascular;  Laterality: N/A;  . INSERTION OF IMPLANTABLE LEFT VENTRICULAR ASSIST DEVICE N/A 12/13/2016   Procedure: INSERTION OF IMPLANTABLE LEFT VENTRICULAR ASSIST DEVICE;  Surgeon: Ivin Poot, MD;  Location: [place];  Service: Open Heart Surgery;  Laterality: N/A;  HEARTMATE II  NITRIC OXIDE  . IR FLUORO GUIDE CV LINE LEFT  06/17/2017  . IR US GUIDE VASC ACCESS LEFT  06/17/2017  . MASS EXCISION Left 11/20/2017   Procedure: EXCISION OF LEFT NECK AND RIGHT LEG SKIN CANCER WITH A CELL PLACEMENT;  Surgeon: Wallace Going, DO;  Location: [place];  Service: Plastics;  Laterality: Left;  Marland Kitchen MASS EXCISION Left 11/26/2017    Procedure: EXPLORATION AND EXCISION OF LEFT NECK;  Surgeon: Wallace Going, DO;  Location: [place];  Service: Plastics;  Laterality: Left;  . RIGHT HEART CATH N/A 12/11/2016   Procedure: Right Heart Cath;  Surgeon: Larey Dresser, MD;  Location: [place] CV LAB;  Service: Cardiovascular;  Laterality: N/A;  . RIGHT HEART CATH N/A 03/27/2017   Procedure: Right Heart Cath;  Surgeon: Larey Dresser, MD;  Location: [place] CV [facility];  Service: Cardiovascular;  Laterality: N/A;  . RIGHT HEART CATH N/A 06/13/2017   Procedure: RIGHT HEART CATH;  Surgeon: Jolaine Artist, MD;  Location: [place] [facility];  Service: Cardiovascular;  Laterality: N/A;  . TEE WITHOUT CARDIOVERSION N/A 12/13/2016   Procedure: TRANSESOPHAGEAL ECHOCARDIOGRAM (TEE);  Surgeon: Ivin Poot, MD;  Location: [place];  Service: Open Heart Surgery;  Laterality: N/A;  . TRICUSPID VALVE REPLACEMENT N/A 12/13/2016   Procedure: TRICUSPID VALVE REPAIR WITH EDWARDS MC 3 TRICUSPID ANNULOPLASTY RING MODEL 4900 SIZE T 28;  Surgeon: Ivin Poot, MD;  Location: [place];  Service: Open Heart Surgery;  Laterality: N/A;    Family History: Family History  Problem Relation Age of Onset  . Heart failure Father   . Heart attack Father   . Healthy Mother   . Diabetes Paternal Grandfather     Social History: Social History   Socioeconomic History  . Marital status: Widowed    Spouse name: Not on file  . Number of children: 0  . Years of education: 60  . Highest education level: Not on file  Occupational History  . Not on file  Social Needs  . Financial resource strain: Not on file  . Food insecurity:    Worry: Not on file    Inability: Not on file  . Transportation needs:    Medical: Not on file    Non-medical: Not on file  Tobacco Use  . Smoking status: Never Smoker  . Smokeless tobacco: Never Used  Substance and Sexual Activity  . Alcohol use: No  . Drug use: No  . Sexual activity: Not Currently  Lifestyle  .  Physical activity:    Days per week: Not on file    Minutes per session: Not on file  . Stress: Not on file  Relationships  . Social connections:    Talks on phone: Not on  file    Gets together: Not on file    Attends religious service: Not on file    Active member of club or organization: Not on file    Attends meetings of clubs or organizations: Not on file    Relationship status: Not on file  Other Topics Concern  . Not on file  Social History Narrative   Patient is a widow. Moved to [place] from [place], [place]. Currently lives with his sister Sheldon Sem.    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     Allergies:  No Known Allergies  Objective:    Vital Signs:   Resp:  [14] 14 (05/06 1200) BP: (72)/(53) 72/53 (05/06 1200)   There were no vitals filed for this visit.  Mean arterial Pressure 60s  Physical Exam    General:  Fatigued and ill appearing. No resp difficulty HEENT: Normal Neck: supple. JVP 8-9 cm. Carotids 2+ bilat; no bruits. No lymphadenopathy or thyromegaly appreciated. Cor: Mechanical heart sounds with LVAD hum present. Lungs: Clear Abdomen: soft, nontender, nondistended. No hepatosplenomegaly. No bruits or masses. Good bowel sounds. Driveline: C/D/I; securement device intact and driveline incorporated Extremities: no cyanosis, clubbing, or rash. Trace to 1+ edema.  Neuro: alert & orientedx3, cranial nerves grossly intact. moves all 4 extremities w/o difficulty. Affect pleasant  Telemetry   Afib 50-60s, personally reviewed  EKG   No new tracings.    Labs    Basic Metabolic Panel: Recent Labs  Lab 03/03/18 0236 03/04/18 0203 03/05/18 0234  NA 135 136 136  K 4.3 4.3 4.0  CL 98* 99* 98*  CO2 29 30 32  GLUCOSE 204* 114* 168*  BUN 16 29* 18  CREATININE 2.76* 3.52* 2.78*  CALCIUM 8.4* 8.6* 8.4*  PHOS 1.9* 2.4* 1.9*    Liver Function Tests: Recent Labs  Lab 03/03/18 0236 03/04/18 0203 03/05/18 0234  ALBUMIN 3.1*  3.2* 3.3*   No results for input(s): LIPASE, AMYLASE in the last 168 hours. No results for input(s): AMMONIA in the last 168 hours.  CBC: Recent Labs  Lab 03/03/18 0236 03/04/18 0203 03/05/18 0234  WBC 8.6 9.1 9.5  HGB 8.1* 8.3* 8.0*  HCT 25.2* 26.0* 24.4*  MCV 94.0 94.9 94.9  PLT 97* 104* 109*    Cardiac Enzymes: No results for input(s): CKTOTAL, CKMB, CKMBINDEX, TROPONINI in the last 168 hours.  BNP: BNP (last 3 results) No results for input(s): BNP in the last 8760 hours.  ProBNP (last 3 results) No results for input(s): PROBNP in the last 8760 hours.   CBG: Recent Labs  Lab 03/04/18 1252 03/04/18 1635 03/04/18 2228 03/05/18 0817 03/05/18 1234  GLUCAP 185* 143* 198* 70 148*    Coagulation Studies: No results for input(s): LABPROT, INR in the last 72 hours.  Other results:  Imaging    Dg Chest Portable 1 View  Result Date: 03/09/2018 CLINICAL DATA:  LVAD EXAM: PORTABLE CHEST 1 VIEW COMPARISON:  02/28/2018 FINDINGS: Cardiopericardial enlargement is stable. There are changes of CABG, aortic valve replacement, and mitral valve repair. An LVAD and ICD are in place. There is no edema, consolidation, effusion, or pneumothorax. IMPRESSION: No evidence of active disease.  Stable from prior. Electronically Signed   By: Monte Fantasia M.D.   On: 03/09/2018 12:09       Patient Profile:   Trevor [name] is a 72 y.o. male with a history of CAD s/p CABG x 4 2010, OSA, AS with TAVR 2015, ,ESRD on HD,DM2,chronicatrial  fibrillation, asthma, GI bleed, and ischemic cardiomyopathy with Medtronic ICD.S/P HMII LVAD for Destination therapy on 12/14/2015.  Presented to [facility] 03/09/18 from HD with worsening fatigue and + melena.   Assessment/Plan:    1. GI bleeding: AVMs on colonoscopy 4/18 admission, APC + clipping. Underwent Colon/EGD 02/27/18 with normal EGD. Colonoscopy with red blood and coffee-ground heme was found in the entire colon (cecum to rectum). No source found  for active bleeding despite copious irrigation and lavage and careful inspection.  Capsule Endoscopy 02/27/18: Showed active bleeding in the ileum; source not found. Given clinical history and LVAD status this is likely related to active bleeding ileal angioectasias/angiodysplasias. Felt not to be reachable by double balloon at [facility] per Dr. Hilarie Fredrickson.  - Hgb 7.2. Will give 2 units PRBCs today. If feels better, may be able to go home with close follow up.  - Will give benadryl prior with previous suspected histamine reaction.  - Has been getting doxycycline 100 bid as outpatient.  - Has been getting outpatient ocreotide.  - Off ASA, hold warfarin today.  - INR 1.97. Goal 1.8-2.3, will likely have to decrease goal to 1.5 - 2.0   2.Acute on chronic systolic CHF: Ischemic cardiomyopathy, EF 20-25% with RV dysfunction on 2/18 echo pre-LVAD. 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 Medtronic ICD. He has had problems with RV failure post-op. He developed worsening renal dysfunction and finally had to start HD in 8/18. There has been some difficulty getting adequate fluid off him, currently dialyzing 4 days/week. - VAD interrogated personally. Parameters stable apart from significant number of PI events in setting of bleeding.  4. CAD s/p CABG  - No s/s of ischemia.    - Off ASA with bleeding -He is on atorvastatin. 5. ESRD: Renal following AVF has matured and being used.  He tolerates HD poorly, has very poor QOL on days of HD.  We discussed possibility of PD with nephrology and surgery, but concensus from surgeons here appears to be that it would be very difficult to place PD catheter.   - Have had long and multiple discussions regarding the HD process and fluid restriction.  - Trying to get back from 4 days a week to 3 days a week as tolerated.  - Goal fluid restriction < 1800 cc.  6. Chronic A Fib:  - Rate controlled.   7. RV failure:  - Continue revatio 40 mg TID.  - No change to current plan.   8. HTN -  MAP low with bleeding.  9. 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 - Holding off on radiation. [place] not think he can handle radiationas well asHD4days/week.  - No change to current plan.   10. H/o TAVR:  - Stable on recent Echo.   Pt has received 1 unit and continues to feel horrible.  Had large bloody BM in ER.   Will continue second unit and admit for further evaluation and treatment. Will have GI see, will likely need HD tomorrow with multiple PRBCs.   I reviewed the LVAD parameters from today, and compared the results to the patient's prior recorded data.  No programming changes were made.  The LVAD is functioning within specified parameters.  The patient performs LVAD self-test daily.  LVAD interrogation was negative for any significant power changes, alarms or PI events/speed drops.  LVAD equipment check completed and is in good working order.  Back-up equipment present.   LVAD  education done on emergency procedures and precautions and reviewed exit site care.  Length of Stay: 0  Annamaria Helling 03/09/2018, 12:22 PM  VAD Team Pager [phone] (7am - 7am) +++VAD ISSUES ONLY+++   Advanced Heart Failure Team Pager [phone] (M-F; 7a - 4p)  Please contact [facility] for night-coverage after hours (4p -7a ) and weekends on [email or web address] for all non- LVAD Issues  Patient seen with PA, agree with the above note. LVAD parameters are stable, he is not volume overloaded on exam. Had HD today but did not remove as much fluid as usual.  He is having active bleeding again with BRBPR. Hemoglobin down to 7.2. I will give 2 units PRBCs today, hold warfarin for now.    Dr. Silverio Decamp saw today for GI. Recent capsule endoscopy with ileal bleeding. She discussed with GI at [facility], he appears to be a candidate for retrograde double balloon endoscopy. We will work on getting him transferred to  [facility] for this.  He will need to have HD while in the hospital there.   Loralie Champagne 03/09/2018

## 2018-03-10 ENCOUNTER — Inpatient Hospital Stay (HOSPITAL_COMMUNITY): Payer: Medicare Other

## 2018-03-10 ENCOUNTER — Other Ambulatory Visit (HOSPITAL_COMMUNITY): Payer: Medicare Other

## 2018-03-10 DIAGNOSIS — I340 Nonrheumatic mitral (valve) insufficiency: Secondary | ICD-10-CM

## 2018-03-10 DIAGNOSIS — K921 Melena: Principal | ICD-10-CM

## 2018-03-10 LAB — CBC
HCT: 24.4 % — ABNORMAL LOW (ref 39.0–52.0)
HEMOGLOBIN: 8.1 g/dL — AB (ref 13.0–17.0)
MCH: 30.3 pg (ref 26.0–34.0)
MCHC: 33.2 g/dL (ref 30.0–36.0)
MCV: 91.4 fL (ref 78.0–100.0)
Platelets: 139 10*3/uL — ABNORMAL LOW (ref 150–400)
RBC: 2.67 MIL/uL — AB (ref 4.22–5.81)
RDW: 18.4 % — ABNORMAL HIGH (ref 11.5–15.5)
WBC: 9.2 10*3/uL (ref 4.0–10.5)

## 2018-03-10 LAB — ECHOCARDIOGRAM LIMITED
Height: 65 in
WEIGHTICAEL: 2871.27 [oz_av]

## 2018-03-10 LAB — BASIC METABOLIC PANEL
Anion gap: 8 (ref 5–15)
BUN: 29 mg/dL — ABNORMAL HIGH (ref 6–20)
CHLORIDE: 96 mmol/L — AB (ref 101–111)
CO2: 33 mmol/L — ABNORMAL HIGH (ref 22–32)
Calcium: 8.2 mg/dL — ABNORMAL LOW (ref 8.9–10.3)
Creatinine, Ser: 3.51 mg/dL — ABNORMAL HIGH (ref 0.61–1.24)
GFR calc non Af Amer: 16 mL/min — ABNORMAL LOW (ref >60)
GFR, EST AFRICAN AMERICAN: 19 mL/min — AB (ref >60)
Glucose, Bld: 142 mg/dL — ABNORMAL HIGH (ref 65–99)
POTASSIUM: 3.3 mmol/L — AB (ref 3.5–5.1)
SODIUM: 137 mmol/L (ref 135–145)

## 2018-03-10 LAB — HEMOGLOBIN AND HEMATOCRIT, BLOOD
HCT: 28.1 % — ABNORMAL LOW (ref 39.0–52.0)
Hemoglobin: 9.5 g/dL — ABNORMAL LOW (ref 13.0–17.0)

## 2018-03-10 LAB — PROTIME-INR
INR: 2.09
PROTHROMBIN TIME: 23.3 s — AB (ref 11.4–15.2)

## 2018-03-10 LAB — PHOSPHORUS: Phosphorus: 2.9 mg/dL (ref 2.5–4.6)

## 2018-03-10 LAB — LACTATE DEHYDROGENASE: LDH: 219 U/L — ABNORMAL HIGH (ref 98–192)

## 2018-03-10 LAB — PREPARE RBC (CROSSMATCH)

## 2018-03-10 MED ORDER — GUAIFENESIN-DM 100-10 MG/5ML PO SYRP
5.0000 mL | ORAL_SOLUTION | ORAL | Status: DC | PRN
  Administered 2018-03-10 (×3): 5 mL via ORAL
  Filled 2018-03-10 (×3): qty 5

## 2018-03-10 MED ORDER — DARBEPOETIN ALFA 100 MCG/0.5ML IJ SOSY
PREFILLED_SYRINGE | INTRAMUSCULAR | Status: AC
  Filled 2018-03-10: qty 0.5

## 2018-03-10 MED ORDER — ATORVASTATIN CALCIUM 40 MG PO TABS
40.0000 mg | ORAL_TABLET | Freq: Every day | ORAL | Status: DC
  Administered 2018-03-10: 40 mg via ORAL
  Filled 2018-03-10: qty 1

## 2018-03-10 MED ORDER — CALCITRIOL 0.5 MCG PO CAPS: 0.5000 ug | ORAL_CAPSULE | ORAL | Status: AC

## 2018-03-10 MED ORDER — SEVELAMER CARBONATE 800 MG PO TABS: 1600.0000 mg | ORAL_TABLET | Freq: Three times a day (TID) | ORAL | Status: DC | PRN

## 2018-03-10 MED ORDER — ACETAMINOPHEN 325 MG PO TABS: 650.0000 mg | ORAL_TABLET | ORAL | Status: AC | PRN

## 2018-03-10 MED ORDER — ATORVASTATIN CALCIUM 40 MG PO TABS: 40.0000 mg | ORAL_TABLET | Freq: Every day | ORAL | 3 refills | Status: DC

## 2018-03-10 MED ORDER — DARBEPOETIN ALFA 100 MCG/0.5ML IJ SOSY
100.0000 ug | PREFILLED_SYRINGE | Freq: Once | INTRAMUSCULAR | Status: AC
  Administered 2018-03-10: 100 ug via INTRAVENOUS
  Filled 2018-03-10: qty 0.5

## 2018-03-10 MED ORDER — DIPHENHYDRAMINE HCL 50 MG/ML IJ SOLN
25.0000 mg | Freq: Once | INTRAMUSCULAR | Status: AC
  Administered 2018-03-10: 25 mg via INTRAVENOUS
  Filled 2018-03-10: qty 1

## 2018-03-10 MED ORDER — SODIUM CHLORIDE 0.9 % IV SOLN: Freq: Once | INTRAVENOUS | Status: DC

## 2018-03-10 NOTE — Procedures (Signed)
I was present at this dialysis session. I have reviewed the session itself and made appropriate changes.  He is below his edw, however he does feel a little congested.  Currently receiving blood transfusion with HD and will increase goal uf for net 1 liter negative.  Filed Weights   03/09/18 2100 03/10/18 0622 03/10/18 0815  Weight: 80.2 kg (176 lb 12.8 oz) 80.4 kg (177 lb 3.2 oz) 81.4 kg (179 lb 7.3 oz)    Recent Labs  Lab 03/10/18 0227  NA 137  K 3.3*  CL 96*  CO2 33*  GLUCOSE 142*  BUN 29*  CREATININE 3.51*  CALCIUM 8.2*  PHOS 2.9    Recent Labs  Lab 03/05/18 0234 03/09/18 1156 03/09/18 2155 03/10/18 0227  WBC 9.5 8.8  --  9.2  NEUTROABS  --  6.6  --   --   HGB 8.0* 7.2* 7.9* 8.1*  HCT 24.4* 21.9* 23.7* 24.4*  MCV 94.9 94.4  --  91.4  PLT 109* 142*  --  139*    Scheduled Meds: . atorvastatin  40 mg Oral q1800  . budesonide  0.5 mg Inhalation BID  . busPIRone  5 mg Oral BID  . [START ON 03/11/2018] calcitRIOL  0.5 mcg Oral Q M,W,F-HD  . darbepoetin (ARANESP) injection - DIALYSIS  100 mcg Intravenous Once  . doxycycline  100 mg Oral Q12H  . ferric citrate  420 mg Oral TID WC  . gabapentin  300 mg Oral BID  . insulin glargine  15 Units Subcutaneous QHS  . levothyroxine  25 mcg Oral QAC breakfast  . magnesium oxide  400 mg Oral Daily  . multivitamin  1 tablet Oral QHS  . pantoprazole  40 mg Oral Daily  . sevelamer carbonate  2,400 mg Oral TID WC  . sildenafil  40 mg Oral TID   Continuous Infusions: . sodium chloride    . sodium chloride     PRN Meds:.acetaminophen, albuterol, guaiFENesin-dextromethorphan, ondansetron (ZOFRAN) IV, sevelamer carbonate, traMADol, traZODone   Donetta Potts,  MD 03/10/2018, 9:49 AM

## 2018-03-10 NOTE — Discharge Summary (Signed)
Advanced Heart Failure Team  Discharge Summary   Patient ID: [name] MRN: [id number], DOB/AGE: May 07, 1946 72 y.o. Admit date: 03/09/2018 D/C date:     03/10/2018   Primary Discharge Diagnoses:  1.GI bleeding - s/p Colon/EGD 02/27/18 with normal EGD. Colonoscopy with red blood and coffee-ground heme was found in the entire colon (cecum to rectum).  - Capsule Endoscopy 02/27/18: Showed active bleeding in the ileum; source not found. Given clinical history and LVAD status this is likely related to active bleeding ileal angioectasias/angiodysplasias. 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 Medtronic ICD 4.CAD s/p CABG  5.ESRD on M/W/F/Saturday HD 6.Chronic A Fib 7.RV failure 8.HTN 9.Squamous cell skin cancer 10.H/o TAVR:  Hospital Course:   Trevor Slowey Murphyis a 72 y.o.malewith a history of CAD s/p CABG x 4 2010, OSA, AS with TAVR 2015, ,ESRD on HD,DM2,chronicatrial fibrillation, asthma,GI bleed,and ischemic cardiomyopathy with Medtronic ICD.S/PHMII LVAD for Destination therapy on 12/14/2015.  Previously admitted 4/22 - 03/05/18 with GI bleeding and Hgb 7.7 on admit. Received total of 5 uPRBCs. Colon/EGD 02/27/18 with normal EGD. Colonoscopy with red blood and coffee-ground heme was found in the entire colon (cecum to rectum).  Underwent capsule Endo 02/27/18 which showed active bleeding, with no source found. Thought to be in illeum. Continue to have blood streaking in BM but no gross melena. Transitioned to danazol for outpatient use and continued on monthly octreotide.   Pt presented to ED 03/09/18 with on-going melena and fatigued. Transferred from HD center after 1.5 L of UF. Hgb on admit 7.2 and INR 1.97.  Give 2 units of PRBCs. Pt continued to have BMs ranging between melena and frank hematochezia. GI consulted and agreed best option to transfer to [facility] for possible push  enteroscopy.   With blood transfusion, Pt given HD am of 03/10/18 off schedule. Plan to potentially transfer for procedure then bring back to continue his HD treatments here at [facility] s/p enteroscopy.   Pt given 2 additional units of PRBCs 03/10/18 with lack of appropriate rise in Hgb s/p 2 units on admit and continued bleeding.  Hgb 7.2 -> 8.1 -> 8.2.  Discussed with LVAD and GI teams at [facility] who agreed for transfer of patient. Pt will be transferred in stable condition for procedure, likely 03/11/18. Pt changed to clear liquid diet to aid in colon prep.   CDs with imaging prepared for transport including CT scans, EGD, Colonoscopy, and Ramp Echo. MOST form signed and on chart. EMTALA completed.   Note: Warfarin NOT ordered. On hold with bleed, to dose accordingly after procedure Danazol continued. Pt has not obtained this medication yet. Remains on doxycycline currently.   LVAD Interrogation HM 3: Speed: 9400 Flow: 6.0 PI: 3.4 Power: 6.0. > 120 PI events     Discharge Weight: 179 lb Discharge Vitals: Blood pressure (!) 103/58, pulse 63, temperature 97.9 F (36.6 C), temperature source Oral, resp. rate 16, height 5\' 5"  (1.651 m), weight 177 lb 4 oz (80.4 kg), SpO2 95 %.  Labs: Lab Results  Component Value Date   WBC 9.2 03/10/2018   HGB 9.5 (L) 03/10/2018   HCT 28.1 (L) 03/10/2018   MCV 91.4 03/10/2018   PLT 139 (L) 03/10/2018    Recent Labs  Lab 03/10/18 0227  NA 137  K 3.3*  CL 96*  CO2 33*  BUN 29*  CREATININE 3.51*  CALCIUM 8.2*  GLUCOSE 142*   Lab Results  Component Value Date   CHOL 59 12/04/2016   HDL 30 (L) 12/04/2016   LDLCALC 15 12/04/2016   TRIG 68 12/04/2016   BNP (last 3 results) No results for input(s): BNP in the last 8760 hours.  ProBNP (last 3 results) No results for input(s): PROBNP in the last 8760 hours.   Diagnostic Studies/Procedures   Dg Chest Portable 1 View  Result Date: 03/09/2018 CLINICAL DATA:  LVAD EXAM: PORTABLE CHEST 1 VIEW COMPARISON:   02/28/2018 FINDINGS: Cardiopericardial enlargement is stable. There are changes of CABG, aortic valve replacement, and mitral valve repair. An LVAD and ICD are in place. There is no edema, consolidation, effusion, or pneumothorax. IMPRESSION: No evidence of active disease.  Stable from prior. Electronically Signed   By: Monte Fantasia M.D.   On: 03/09/2018 12:09    Discharge Medications   Allergies as of 03/10/2018   No Known Allergies     Medication List    STOP taking these medications   warfarin 5 MG tablet Commonly known as:  COUMADIN     TAKE these medications   acetaminophen 325 MG tablet Commonly known as:  TYLENOL Take 2 tablets (650 mg total) by mouth every 4 (four) hours as needed for headache or mild pain.   albuterol 108 (90 Base) MCG/ACT inhaler Commonly known as:  PROVENTIL HFA;VENTOLIN HFA Inhale 2 puffs into the lungs every 4 (four) hours as needed for wheezing or shortness of breath.   atorvastatin 40 MG tablet Commonly known as:  LIPITOR TAKE 1 TABLET(40 MG) BY MOUTH DAILY What changed:    how much to take  how to take this  when to take this  additional instructions   AURYXIA 1 GM 210 MG(Fe) tablet Generic drug:  ferric citrate Take 420 mg by mouth 3 (three) times daily with meals.   busPIRone 5 MG tablet Commonly known as:  BUSPAR Take 1 tablet (5 mg total) by mouth 2 (two) times daily.   calcitRIOL 0.5 MCG capsule Commonly known as:  ROCALTROL Take 1 capsule (0.5 mcg total) by mouth every Monday, Wednesday, and Friday with hemodialysis. Start taking on:  03/11/2018   danazol 100 MG capsule Commonly known as:  DANOCRINE Take 1 capsule (100 mg total) by mouth 2 (two) times daily.   docusate sodium 100 MG capsule Commonly known as:  COLACE Take 1 capsule (100 mg total) by mouth 2 (two) times daily. What changed:  when to take this   doxycycline 100 MG tablet Commonly known as:  VIBRA-TABS Take 1 tablet (100 mg total) by mouth every 12  (twelve) hours.   fluticasone 110 MCG/ACT inhaler Commonly known as:  FLOVENT HFA Inhale 2 puffs into the lungs 2 (two) times daily.   gabapentin 600 MG tablet Commonly known as:  NEURONTIN Take 0.5 tablets (300 mg total) by mouth 2 (two) times daily.   insulin glargine 100 unit/mL Sopn Commonly known as:  LANTUS Inject 0.15 mLs (15 Units total) into the skin at bedtime.   levothyroxine 25 MCG tablet Commonly known as:  SYNTHROID, LEVOTHROID Take 1 tablet (25 mcg total) by mouth daily before breakfast.   magnesium oxide 400 MG tablet Commonly known as:  MAG-OX Take 400 mg by mouth daily.   multivitamin Tabs tablet Take 1 tablet by mouth at bedtime.   pantoprazole 40 MG tablet Commonly known as:  PROTONIX Take 1 tablet (40 mg total) by mouth daily.   sevelamer carbonate 800 MG tablet Commonly known as:  RENVELA Take 1,600-2,400  mg by mouth See admin instructions. Take 2,400 mg by mouth three times a day with meals and 1,600 mg two times a day with snacks   sildenafil 20 MG tablet Commonly known as:  REVATIO Take 2 tablets (40 mg total) 3 (three) times daily by mouth. What changed:    when to take this  additional instructions   traMADol 50 MG tablet Commonly known as:  ULTRAM Take 1 tablet (50 mg total) by mouth every 12 (twelve) hours as needed. TAKE 1 TABLET BY MOUTH EVERY 6 HOURS AS NEEDED FOR MODERATE PAIN What changed:    when to take this  reasons to take this  additional instructions   traZODone 100 MG tablet Commonly known as:  DESYREL TAKE 1 TABLET BY MOUTH EVERY NIGHT AT BEDTIME AS NEEDED FOR SLEEP What changed:    how much to take  how to take this  when to take this  additional instructions       Disposition   The patient will be discharged in stable condition to [facility] for push enteroscopy. Discharge Instructions    Diet - low sodium heart healthy   Complete by:  As directed    Increase activity slowly   Complete by:  As directed       [place], [facility] Follow up.   Contact information: [place] [address] [phone]             Duration of Discharge Encounter: Greater than 35 minutes   Signed, Shirley Friar, PA-C  03/10/2018, 1:53 PM

## 2018-03-10 NOTE — Consult Note (Addendum)
[facility] Renal Consultation Note    Indication for Consultation:  Management of ESRD/hemodialysis; anemia, hypertension/volume and secondary hyperparathyroidism PCP:  HPI: Trevor [name] is a 72 y.o. male with ESRD on hemodialysis MWF at [facility]. He has complex past medical history of CAD, CABG, AS S/P TAVR, ischemic cardiomyopathy with ICD, S/P LVAD placement in 2017 for destination therapy, atrial fib, GI bleed, T2DDM. H/O AVM of colon. Recent admission 04/22-05/02/19 for GI bleed. He presented to dialysis unit yesterday with C/O weakness and BRB in stool. He attempted to stay for dialysis but was only able to tolerate 3:28 of 4 hour treatment. He presented to ED and was found to have heme positive stool and HGB 7.2. He has been seen by GI, Dr. Silverio Decamp. He has failed conservative treatment with octreotide. Referral to [facility] for double balloon enteroscopy has been arranged.   He was seen in ED yesterday and again today on dialysis. He endorsed weakness, fatigue and feeling "slightly congested", otherwise no other complaints. He has been admitted by HF team for GI bleed. Plans are in place for transfer to [facility]. He is currently on hemodialysis this AM.    Past Medical History:  Diagnosis Date  . AICD (automatic cardioverter/defibrillator) present   . Asthma   . AVM (arteriovenous malformation) of colon 07/07/2017  . CHF (congestive heart failure) ([place])   . Diabetes ([place])   . ESRF (end stage renal failure) ([place]) 07/07/2017   pt receiving hemodialysis Monday- Wednesday-friday, sometimes Saturday  . Kidney disease   . LVAD (left ventricular assist device) present ([place]) 12/2016  . Permanent atrial fibrillation ([place]) 07/07/2017  . Presence of permanent cardiac pacemaker    AICD  . Sleep apnea    Past Surgical History:  Procedure Laterality Date  . AORTIC VALVE REPLACEMENT  2015   [place]  . APPLICATION OF A-CELL OF EXTREMITY Left 11/20/2017   Procedure:  APPLICATION OF A-CELL;  Surgeon: Wallace Going, DO;  Location: [place];  Service: Plastics;  Laterality: Left;  . AV FISTULA PLACEMENT Left 06/30/2017   Procedure: CREATION of LEFT ARM Brachiocephalic Fistula;  Surgeon: Conrad Rosemont, MD;  Location: [place];  Service: Vascular;  Laterality: Left;  . CARDIAC CATHETERIZATION N/A 12/02/2016   Procedure: Right Heart Cath;  Surgeon: Larey Dresser, MD;  Location: [place] CV LAB;  Service: Cardiovascular;  Laterality: N/A;  . COLONOSCOPY N/A 02/14/2017   Procedure: COLONOSCOPY;  Surgeon: Milus Banister, MD;  Location: [place];  Service: Endoscopy;  Laterality: N/A;  . COLONOSCOPY WITH PROPOFOL N/A 02/27/2018   Procedure: COLONOSCOPY WITH PROPOFOL;  Surgeon: Jerene Bears, MD;  Location: [place];  Service: Gastroenterology;  Laterality: N/A;  . CORONARY ANGIOPLASTY WITH STENT PLACEMENT  2013   in [place]  . CORONARY ARTERY BYPASS GRAFT  2010   in [place]  . DIALYSIS/PERMA CATHETER INSERTION  06/30/2017   Procedure: INSERTION Dialysis Catheter;  Surgeon: Conrad [place], MD;  Location: [facility] OR;  Service: Vascular;;  . ESOPHAGOGASTRODUODENOSCOPY (EGD) WITH PROPOFOL N/A 02/27/2018   Procedure: ESOPHAGOGASTRODUODENOSCOPY (EGD) WITH PROPOFOL;  Surgeon: Jerene Bears, MD;  Location: [facility] ENDOSCOPY;  Service: Gastroenterology;  Laterality: N/A;  . GIVENS CAPSULE STUDY N/A 02/27/2018   Procedure: GIVENS CAPSULE STUDY;  Surgeon: Jerene Bears, MD;  Location: [place];  Service: Gastroenterology;  Laterality: N/A;  . IABP INSERTION N/A 12/11/2016   Procedure: IABP Insertion;  Surgeon: Larey Dresser, MD;  Location: [place] CV LAB;  Service:  Cardiovascular;  Laterality: N/A;  . INSERTION OF IMPLANTABLE LEFT VENTRICULAR ASSIST DEVICE N/A 12/13/2016   Procedure: INSERTION OF IMPLANTABLE LEFT VENTRICULAR ASSIST DEVICE;  Surgeon: Ivin Poot, MD;  Location: [place];  Service: Open Heart Surgery;  Laterality: N/A;  HEARTMATE II  NITRIC OXIDE  . IR  FLUORO GUIDE CV LINE LEFT  06/17/2017  . IR US GUIDE VASC ACCESS LEFT  06/17/2017  . MASS EXCISION Left 11/20/2017   Procedure: EXCISION OF LEFT NECK AND RIGHT LEG SKIN CANCER WITH A CELL PLACEMENT;  Surgeon: Wallace Going, DO;  Location: [place];  Service: Plastics;  Laterality: Left;  Marland Kitchen MASS EXCISION Left 11/26/2017   Procedure: EXPLORATION AND EXCISION OF LEFT NECK;  Surgeon: Wallace Going, DO;  Location: [place];  Service: Plastics;  Laterality: Left;  . RIGHT HEART CATH N/A 12/11/2016   Procedure: Right Heart Cath;  Surgeon: Larey Dresser, MD;  Location: [place] CV LAB;  Service: Cardiovascular;  Laterality: N/A;  . RIGHT HEART CATH N/A 03/27/2017   Procedure: Right Heart Cath;  Surgeon: Larey Dresser, MD;  Location: [place] CV LAB;  Service: Cardiovascular;  Laterality: N/A;  . RIGHT HEART CATH N/A 06/13/2017   Procedure: RIGHT HEART CATH;  Surgeon: Jolaine Artist, MD;  Location: [place] CV LAB;  Service: Cardiovascular;  Laterality: N/A;  . TEE WITHOUT CARDIOVERSION N/A 12/13/2016   Procedure: TRANSESOPHAGEAL ECHOCARDIOGRAM (TEE);  Surgeon: Ivin Poot, MD;  Location: [place];  Service: Open Heart Surgery;  Laterality: N/A;  . TRICUSPID VALVE REPLACEMENT N/A 12/13/2016   Procedure: TRICUSPID VALVE REPAIR WITH EDWARDS MC 3 TRICUSPID ANNULOPLASTY RING MODEL 4900 SIZE T 28;  Surgeon: Ivin Poot, MD;  Location: [place];  Service: Open Heart Surgery;  Laterality: N/A;   Family History  Problem Relation Age of Onset  . Heart failure Father   . Heart attack Father   . Healthy Mother   . Diabetes Paternal Grandfather    Social History:  reports that he has never smoked. He has never used smokeless tobacco. He reports that he does not drink alcohol or use drugs. No Known Allergies Prior to Admission medications   Medication Sig Start Date End Date Taking? Authorizing Provider  albuterol (PROVENTIL HFA;VENTOLIN HFA) 108 (90 Base) MCG/ACT inhaler Inhale 2 puffs into  the lungs every 4 (four) hours as needed for wheezing or shortness of breath. 02/10/18  Yes Larey Dresser, MD  atorvastatin (LIPITOR) 40 MG tablet TAKE 1 TABLET(40 MG) BY MOUTH DAILY Patient taking differently: Take 40 mg by mouth daily at 6 PM.  09/02/17  Yes Larey Dresser, MD  busPIRone (BUSPAR) 5 MG tablet Take 1 tablet (5 mg total) by mouth 2 (two) times daily. 01/22/18  Yes Bensimhon, Shaune Pascal, MD  calcitRIOL (ROCALTROL) 0.5 MCG capsule Take 1 capsule (0.5 mcg total) by mouth every other day. Patient taking differently: Take 0.5 mcg by mouth every Monday, Wednesday, and Friday with hemodialysis.  07/08/17  Yes Bensimhon, Shaune Pascal, MD  docusate sodium (COLACE) 100 MG capsule Take 1 capsule (100 mg total) by mouth 2 (two) times daily. Patient taking differently: Take 100 mg daily by mouth.  04/03/17  Yes Larey Dresser, MD  doxycycline (VIBRA-TABS) 100 MG tablet Take 1 tablet (100 mg total) by mouth every 12 (twelve) hours. 03/05/18  Yes Clegg, Amy D, NP  ferric citrate (AURYXIA) 1 GM 210 MG(Fe) tablet Take 420 mg by mouth 3 (three) times daily with meals.  Yes [provider]  fluticasone (FLOVENT HFA) 110 MCG/ACT inhaler Inhale 2 puffs into the lungs 2 (two) times daily. 02/10/18  Yes Larey Dresser, MD  gabapentin (NEURONTIN) 600 MG tablet Take 0.5 tablets (300 mg total) by mouth 2 (two) times daily. 07/23/17  Yes Larey Dresser, MD  insulin glargine (LANTUS) 100 unit/mL SOPN Inject 0.15 mLs (15 Units total) into the skin at bedtime. 03/05/18  Yes Clegg, Amy D, NP  levothyroxine (SYNTHROID, LEVOTHROID) 25 MCG tablet Take 1 tablet (25 mcg total) by mouth daily before breakfast. 10/02/17  Yes Larey Dresser, MD  magnesium oxide (MAG-OX) 400 MG tablet Take 400 mg by mouth daily.   Yes [provider]  multivitamin (RENA-VIT) TABS tablet Take 1 tablet by mouth at bedtime. 09/02/17  Yes Larey Dresser, MD  pantoprazole (PROTONIX) 40 MG tablet Take 1 tablet (40 mg total) by  mouth daily. 08/11/17  Yes Clegg, Amy D, NP  sevelamer carbonate (RENVELA) 800 MG tablet Take 1,600-2,400 mg by mouth See admin instructions. Take 2,400 mg by mouth three times a day with meals and 1,600 mg two times a day with snacks 02/03/18  Yes [provider]  sildenafil (REVATIO) 20 MG tablet Take 2 tablets (40 mg total) 3 (three) times daily by mouth. Patient taking differently: Take 40 mg by mouth See admin instructions. Take two tablets (40 mg) by mouth three times a day- midday, afternoon, and bedtime 09/11/17  Yes Larey Dresser, MD  traMADol (ULTRAM) 50 MG tablet Take 1 tablet (50 mg total) by mouth every 12 (twelve) hours as needed. TAKE 1 TABLET BY MOUTH EVERY 6 HOURS AS NEEDED FOR MODERATE PAIN Patient taking differently: Take 50 mg by mouth every 6 (six) hours as needed for moderate pain.  03/05/18  Yes Clegg, Amy D, NP  traZODone (DESYREL) 100 MG tablet TAKE 1 TABLET BY MOUTH EVERY NIGHT AT BEDTIME AS NEEDED FOR SLEEP Patient taking differently: TAKE 1 TABLET BY MOUTH EVERY NIGHT AT BEDTIME 02/16/18  Yes Larey Dresser, MD  warfarin (COUMADIN) 5 MG tablet Take 1.5 tablets (7.5 mg total) by mouth See admin instructions. Patient taking differently: Take 7.5 mg by mouth daily after supper.  03/05/18  Yes Clegg, Amy D, NP  danazol (DANOCRINE) 100 MG capsule Take 1 capsule (100 mg total) by mouth 2 (two) times daily. 03/05/18   Larey Dresser, MD   Current Facility-Administered Medications  Medication Dose Route Frequency Provider Last Rate Last Dose  . 0.9 %  sodium chloride infusion   Intravenous Once Shirley Friar, PA-C      . 0.9 %  sodium chloride infusion   Intravenous Once Shirley Friar, PA-C      . acetaminophen (TYLENOL) tablet 650 mg  650 mg Oral Q4H PRN Shirley Friar, PA-C      . albuterol (PROVENTIL HFA;VENTOLIN HFA) 108 (90 Base) MCG/ACT inhaler 2 puff  2 puff Inhalation Q4H PRN Shirley Friar, PA-C      . atorvastatin (LIPITOR)  tablet 40 mg  40 mg Oral q1800 Shirley Friar, PA-C      . budesonide (PULMICORT) nebulizer solution 0.5 mg  0.5 mg Inhalation BID Shirley Friar, PA-C   0.5 mg at 03/10/18 3500  . busPIRone (BUSPAR) tablet 5 mg  5 mg Oral BID Shirley Friar, PA-C   5 mg at 03/10/18 9381  . [START ON 03/11/2018] calcitRIOL (ROCALTROL) capsule 0.5 mcg  0.5 mcg Oral Q M,W,F-HD  Shirley Friar, PA-C      . doxycycline (VIBRA-TABS) tablet 100 mg  100 mg Oral Q12H Shirley Friar, PA-C   100 mg at 03/10/18 8841  . ferric citrate (AURYXIA) tablet 420 mg  420 mg Oral TID WC Shirley Friar, PA-C      . gabapentin (NEURONTIN) tablet 300 mg  300 mg Oral BID Shirley Friar, PA-C   300 mg at 03/09/18 2118  . guaiFENesin-dextromethorphan (ROBITUSSIN DM) 100-10 MG/5ML syrup 5 mL  5 mL Oral Q4H PRN Larey Dresser, MD   5 mL at 03/10/18 0324  . insulin glargine (LANTUS) injection 15 Units  15 Units Subcutaneous QHS Larey Dresser, MD   15 Units at 03/09/18 2118  . levothyroxine (SYNTHROID, LEVOTHROID) tablet 25 mcg  25 mcg Oral QAC breakfast Shirley Friar, PA-C   25 mcg at 03/10/18 6606  . magnesium oxide (MAG-OX) tablet 400 mg  400 mg Oral Daily Shirley Friar, PA-C      . multivitamin (RENA-VIT) tablet 1 tablet  1 tablet Oral QHS Shirley Friar, PA-C   1 tablet at 03/09/18 2118  . ondansetron (ZOFRAN) injection 4 mg  4 mg Intravenous Q6H PRN Shirley Friar, PA-C      . pantoprazole (PROTONIX) EC tablet 40 mg  40 mg Oral Daily Shirley Friar, PA-C   40 mg at 03/10/18 0813  . sevelamer carbonate (RENVELA) tablet 1,600 mg  1,600 mg Oral With snacks Larey Dresser, MD      . sevelamer carbonate (RENVELA) tablet 2,400 mg  2,400 mg Oral TID WC Larey Dresser, MD      . sildenafil (REVATIO) tablet 40 mg  40 mg Oral TID Shirley Friar, PA-C   40 mg at 03/10/18 0813  . traMADol (ULTRAM) tablet 50 mg  50 mg Oral Q12H  PRN Shirley Friar, PA-C   50 mg at 03/09/18 2326  . traZODone (DESYREL) tablet 100 mg  100 mg Oral QHS PRN Shirley Friar, PA-C   100 mg at 03/09/18 2326   Labs: Basic Metabolic Panel: Recent Labs  Lab 03/04/18 0203 03/05/18 0234 03/09/18 1156 03/10/18 0227  NA 136 136 136 137  K 4.3 4.0 2.9* 3.3*  CL 99* 98* 93* 96*  CO2 30 32 29 33*  GLUCOSE 114* 168* 143* 142*  BUN 29* 18 17 29*  CREATININE 3.52* 2.78* 2.30* 3.51*  CALCIUM 8.6* 8.4* 7.8* 8.2*  PHOS 2.4* 1.9*  --  2.9   Liver Function Tests: Recent Labs  Lab 03/04/18 0203 03/05/18 0234  ALBUMIN 3.2* 3.3*   No results for input(s): LIPASE, AMYLASE in the last 168 hours. No results for input(s): AMMONIA in the last 168 hours. CBC: Recent Labs  Lab 03/04/18 0203 03/05/18 0234 03/09/18 1156 03/09/18 2155 03/10/18 0227  WBC 9.1 9.5 8.8  --  9.2  NEUTROABS  --   --  6.6  --   --   HGB 8.3* 8.0* 7.2* 7.9* 8.1*  HCT 26.0* 24.4* 21.9* 23.7* 24.4*  MCV 94.9 94.9 94.4  --  91.4  PLT 104* 109* 142*  --  139*   Cardiac Enzymes: No results for input(s): CKTOTAL, CKMB, CKMBINDEX, TROPONINI in the last 168 hours. CBG: Recent Labs  Lab 03/04/18 1635 03/04/18 2228 03/05/18 0817 03/05/18 1234 03/09/18 2120  GLUCAP 143* 198* 70 148* 150*   Iron Studies: No results for input(s): IRON, TIBC, TRANSFERRIN, FERRITIN in the last 72 hours. Studies/Results: Dg  Chest Portable 1 View  Result Date: 03/09/2018 CLINICAL DATA:  LVAD EXAM: PORTABLE CHEST 1 VIEW COMPARISON:  02/28/2018 FINDINGS: Cardiopericardial enlargement is stable. There are changes of CABG, aortic valve replacement, and mitral valve repair. An LVAD and ICD are in place. There is no edema, consolidation, effusion, or pneumothorax. IMPRESSION: No evidence of active disease.  Stable from prior. Electronically Signed   By: Monte Fantasia M.D.   On: 03/09/2018 12:09    ROS: As per HPI otherwise negative.   Physical Exam: Vitals:   03/09/18 2327  03/10/18 0309 03/10/18 0622 03/10/18 0742  BP:      Pulse:    (!) 57  Resp: 20 18  15   Temp: 98 F (36.7 C) 97.8 F (36.6 C)    TempSrc: Oral Oral    SpO2: 94% 96%  98%  Weight:   80.4 kg (177 lb 3.2 oz)   Height:         General: Slightly pale elderly male in NAD. Head: Normocephalic, atraumatic, sclera non-icteric, mucus membranes are moist Neck: Supple. JVD mildy elevated 1/4 to mandible. Lungs: Clear bilaterally to auscultation without wheezes, rales, or rhonchi. Breathing is unlabored. Heart: LVAD hum.  Abdomen: abdomen slightly distended.  M-S:  Strength and tone appear normal for age. Lower extremities:without edema or ischemic changes, no open wounds  Neuro: Alert and oriented X 3. Moves all extremities spontaneously. Psych:  Responds to questions appropriately with a normal affect. Dialysis Access: LUA AVF + bruit   Dialysis Orders: [facility] MWF 4.5 hr 180 NRe 400/800 2.0 K/2.0 Ca  -No heparin  -Mircera 100 mcg IV q 2 weeks (last dose 02/18/18) -Calcitriol 0.5 mcg PO TIW  Assessment/Plan: 1.  GI Bleed: H/O AVM. Capsule endoscopy showed active bleeding in ileum but no source. Transfer to [facility] to GI today.  2.  Acute on chronic systolic HF: On LVAD, followed by HF.  3.  ESRD -  MWF shortened tx yesterday, rec'd 2 units PRBCS yesterday-HD today for volume removal. K+ 3.3-use 4.0 K bath 4.  Hypertension/volume  - attempt to get to OP EDW today.  5.  Anemia  - HGB 8.1 S/P 2 units PRBCs. Give Aranesp 100 mcg IV with HD today.  6.  Metabolic bone disease - Continue binders,VDRA 7.  Nutrition - cl liquids at present. Albumin  8. Afib: per primary 9. RV Failure 10. H/O TAVR  Rita H. Owens Shark, NP-C 03/10/2018, 8:49 AM  [employer] [phone]  I have seen and examined this patient and agree with plan and assessment in the above note with renal recommendations/intervention highlighted.  Tolerating HD well and will UF 1 liter while receiving blood on HD  because he feels "congested".  Plan for transfer to [facility] for double balloon enteroscopy.  No new complaints but still feels "bad". [place] John A Chelcea Zahn,MD 03/10/2018 9:51 AM

## 2018-03-10 NOTE — Progress Notes (Signed)
Speed  Flow  PI  Power  LVIDD  AI  Aortic openings  MR  TR  Septum  RV   9400  5.2 4.4 5.8 4.7  trace Unable to visiualize mild mild Bounce to right mild  9200  5.6 4.9 5.7 4.9 rrace  mild mild midline                                                        Pt in chronic Afib.   Ramp ECHO performed at bedside per [name].   At completion of ramp study, patients primary and back up controller programmed:  Fixed speed:  9200 Low speed limit: 8600  [name] RN, VAD Coordinator 24/7 pager [phone]

## 2018-03-10 NOTE — Progress Notes (Addendum)
LVAD Coordinator Rounding Note:  Admitted 03/09/18 due to GI bleed.   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 Darcey Nora under Destination Therapy criteria due to age excluding heart transplantation.  Vital signs: Temp:  97.2 HR: 64 Doppler Pressure:  Automatic BP:   O2 Sat: 99% RA Wt: 176>177 lbs  LVAD interrogation reveals:  Speed:  9600 Flow:  5.2 Power:  6.1 PI: 4.5 Alarms: none Events:  105 on 5/7; >120 on 5/6 Fixed speed: 9600 Low speed limit: 9000  Primary Controller: Replace back up battery in 7 months Back up controller: Replace back up battery in 20month   Drive Line: Right abdominal Sorbaview dressing with Tegaderm covering intact. Anchor intact and accurately applied. Pt has weekly dressing changes using weekly kit; rinse with sterile saline after Chloraprep applicator, no skin prep or Bio patch used due to skin irritation.   Labs:  LDH trend: 237>219  INR trend: 1.97>2.09  Hgb trend: 8.0>7.2>7.9>8.1  Anticoagulation Plan: - INR Goal: 1.8 - 2.3; likely decrease to 1.5 - 2.0 - ASA Dose: none due to hx of GI bleed - OP monthly Octreotide for hx of GI bleed - Doxy 100 bid started 4/19 hospitalization to try and limit AVM bleeding  Blood Products:  - 03/09/18>2 units PCs - 03/10/18>2 units PCs  Device: - Medtronic single lead -Therapies: on 231 bpm  Arrythmias: chronic afib  Renal:  [facility] Adverse Events on VAD: - 02/2017> GIB- AVM clipped x2 - 07/2017> renal failure- HD started - 11/2017>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 Margins not clear at neck. - 02/23/18> hospitalization for GI bleed. Colon/EGD 02/27/18 with normal EGD. Colonoscopy identified active bleeding in ileum; source not found. Capsule Endoscopy 02/27/18: Showed active bleeding in the ileum. Received 5 units blood with histamine reaction; will give benadryl as OP.   Plan[facility]Recommendations: [facility]  2. Planned HD today (only able to take 1.5 liters off yesterday in OP setting). 3. Weekly dressing changes; due 03/16/18 4. Contact VAD Coordinator for any VAD equipment, drive line site issues.  MZada GirtRN, VAD Coordinator 24/7 VAD pager: [phone]

## 2018-03-10 NOTE — Plan of Care (Signed)
Pt. Discharged to [facility]. Transport received all discharge paperwork, disc with imagining, current labs and report. Report called to RN for room 3113.

## 2018-03-10 NOTE — Progress Notes (Addendum)
Advanced Heart Failure VAD Team Note  PCP-Cardiologist: No primary care provider on file.   Subjective:    GI has seen and recommends transfer to [facility].  Process on-going.   Feeling about the same. Fatigued. Denies SOB. Continue bloody stools over night.   Hgb 7.2 -> 7.9 -> 8.1 with 2 units.  LVAD INTERROGATION:  HeartMate 3 LVAD:   Flow 6.3 liters/min, speed 9600, power 6.0, PI 3.3. >120 PI events.    Objective:    Vital Signs:   Temp:  [97.6 F (36.4 C)-98.9 F (37.2 C)] 97.8 F (36.6 C) (05/07 0309) Pulse Rate:  [48-105] 57 (05/07 0742) Resp:  [12-25] 15 (05/07 0742) BP: (72-99)/(31-79) 73/52 (05/06 2100) SpO2:  [94 %-100 %] 98 % (05/07 0742) Weight:  [176 lb 12.8 oz (80.2 kg)-177 lb 3.2 oz (80.4 kg)] 177 lb 3.2 oz (80.4 kg) (05/07 0622) Last BM Date: 03/09/18 Mean arterial Pressure 82  Intake/Output:   Intake/Output Summary (Last 24 hours) at 03/10/2018 0836 Last data filed at 03/10/2018 0309 Gross per 24 hour  Intake 2145.17 ml  Output -  Net 2145.17 ml     Physical Exam    General:  Fatigued. No resp difficulty HEENT: normal Neck: supple. JVP 9-10 cm. Carotids 2+ bilat; no bruits. No lymphadenopathy or thyromegaly appreciated. Cor: Mechanical heart sounds with LVAD hum present. Lungs: clear Abdomen: soft, nontender, nondistended. No hepatosplenomegaly. No bruits or masses. Good bowel sounds. Driveline: C/D/I; securement device intact and driveline incorporated Extremities: no cyanosis, clubbing, or rash. 1-2+ edema.  Neuro: alert & orientedx3, cranial nerves grossly intact. moves all 4 extremities w/o difficulty. Affect pleasant  Telemetry   NSR, personally reviewed.   EKG   No new tracings.    Labs   Basic Metabolic Panel: Recent Labs  Lab 03/04/18 0203 03/05/18 0234 03/09/18 1156 03/10/18 0227  NA 136 136 136 137  K 4.3 4.0 2.9* 3.3*  CL 99* 98* 93* 96*  CO2 30 32 29 33*  GLUCOSE 114* 168* 143* 142*  BUN 29* 18 17 29*  CREATININE 3.52*  2.78* 2.30* 3.51*  CALCIUM 8.6* 8.4* 7.8* 8.2*  PHOS 2.4* 1.9*  --  2.9    Liver Function Tests: Recent Labs  Lab 03/04/18 0203 03/05/18 0234  ALBUMIN 3.2* 3.3*   No results for input(s): LIPASE, AMYLASE in the last 168 hours. No results for input(s): AMMONIA in the last 168 hours.  CBC: Recent Labs  Lab 03/04/18 0203 03/05/18 0234 03/09/18 1156 03/09/18 2155 03/10/18 0227  WBC 9.1 9.5 8.8  --  9.2  NEUTROABS  --   --  6.6  --   --   HGB 8.3* 8.0* 7.2* 7.9* 8.1*  HCT 26.0* 24.4* 21.9* 23.7* 24.4*  MCV 94.9 94.9 94.4  --  91.4  PLT 104* 109* 142*  --  139*    INR: Recent Labs  Lab 03/04/18 0203 03/05/18 0234 03/09/18 1156 03/10/18 0227  INR 1.66 2.11 1.97 2.09    Other results:  EKG:    Imaging   Dg Chest Portable 1 View  Result Date: 03/09/2018 CLINICAL DATA:  LVAD EXAM: PORTABLE CHEST 1 VIEW COMPARISON:  02/28/2018 FINDINGS: Cardiopericardial enlargement is stable. There are changes of CABG, aortic valve replacement, and mitral valve repair. An LVAD and ICD are in place. There is no edema, consolidation, effusion, or pneumothorax. IMPRESSION: No evidence of active disease.  Stable from prior. Electronically Signed   By: Monte Fantasia M.D.   On: 03/09/2018 12:09  Medications:     Scheduled Medications: . atorvastatin  40 mg Oral q1800  . budesonide  0.5 mg Inhalation BID  . busPIRone  5 mg Oral BID  . [START ON 03/11/2018] calcitRIOL  0.5 mcg Oral Q M,W,F-HD  . diphenhydrAMINE  25 mg Intravenous Once  . doxycycline  100 mg Oral Q12H  . ferric citrate  420 mg Oral TID WC  . gabapentin  300 mg Oral BID  . insulin glargine  15 Units Subcutaneous QHS  . levothyroxine  25 mcg Oral QAC breakfast  . magnesium oxide  400 mg Oral Daily  . multivitamin  1 tablet Oral QHS  . pantoprazole  40 mg Oral Daily  . sevelamer carbonate  1,600 mg Oral With snacks  . sevelamer carbonate  2,400 mg Oral TID WC  . sildenafil  40 mg Oral TID     Infusions: .  sodium chloride    . sodium chloride       PRN Medications:  acetaminophen, albuterol, guaiFENesin-dextromethorphan, ondansetron (ZOFRAN) IV, traMADol, traZODone   Patient Profile   Trevor [name] is a 72 y.o. male with a history of CAD s/p CABG x 4 2010, OSA, AS with TAVR 2015, ,ESRD on HD,DM2,chronicatrial fibrillation, asthma,GI bleed,and ischemic cardiomyopathy with Medtronic ICD.S/PHMII LVAD for Destination therapy on 12/14/2015.  Presented to [facility] 03/09/18 from HD with worsening fatigue and + melena.   Assessment/Plan:    1. GI bleeding: AVMs on colonoscopy 4/18 admission, APC + clipping. Underwent Colon/EGD 02/27/18 with normal EGD. Colonoscopy with red blood and coffee-ground heme was found in the entire colon (cecum to rectum). No source found for active bleeding despite copious irrigation and lavage and careful inspection. Capsule Endoscopy 02/27/18: Showed active bleeding in the ileum; source not found. Given clinical history and LVAD status this is likely related to active bleeding ileal angioectasias/angiodysplasias. - Working on transfer to [facility] for [employer].  - Hgb 8.2 this am despite 2 units. Will repeat 2 units, and pre-dose with benadryl with previous suspected histamine reaction.  - Continue doxycycline 100 bid as outpatient.  - Has been getting outpatient ocreotide.  - Off ASA, holding warfarin for procedure.  - INR 2.09. Goal 1.8-2.3, will likely have to decrease goal to 1.5 - 2.0   2.Acute on chronic systolic CHF: Ischemic cardiomyopathy, EF 20-25% with RV dysfunction on 2/18 echo pre-LVAD. 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 Medtronic ICD. He has had problems with RV failure post-op. He developed worsening renal dysfunction and finally had to start HD in 8/18. There has been some difficulty getting adequate fluid off him, currently dialyzing 4 days/week. - [facility] D interrogated personally. Significant increase in PI events - Will decrease speed to 9400. (May  need to go further to 9200) 4. CAD s/p CABG  - No s/s of ischemia.    - Off ASA with bleeding -He is on atorvastatin. 5. ESRD: Renal following AVF has matured and being used. He tolerates HD poorly, has very poor QOL on days of HD. We discussed possibility of PD with nephrology and surgery, but concensus from surgeons here appears to be that it would be very difficult to place PD catheter.  - Have had long and multiple discussions regarding the HD process and fluid restriction.  - Trying to get back from 4 days a week to 3 days a week as tolerated => this will be a function of his ability to reduce po intake. .  - Goal fluid restriction < 1800 cc.  -  Getting HD today.  6. Chronic A Fib:  - Rate controlled.   - No change to current plan.   7. RV failure:  - Continue revatio 40 mg TID.  - No change to current plan.   8. HTN - MAP low with bleeding.  - No change to current plan.   9. 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 - Holding off on radiation. Hedoes not think he can handle radiationas well asHD4days/week.  - No change to current plan.   10. H/o TAVR:  - Stable on recent Echo.   Give 2 uPRBCs. HD this am. Working on transfer to [facility] for push enterscopy.   I reviewed the LVAD parameters from today, and compared the results to the patient's prior recorded data.  No programming changes were made.  The LVAD is functioning within specified parameters.  The patient performs LVAD self-test daily.  LVAD interrogation was negative for any significant power changes, alarms or PI events/speed drops.  LVAD equipment check completed and is in good working order.  Back-up equipment present.   LVAD education done on emergency procedures and precautions and reviewed exit site care.  Length of Stay: 1  Annamaria Helling 03/10/2018, 8:36 AM  VAD Team --- VAD ISSUES ONLY--- Pager  [phone] (7am - 7am)  Advanced Heart Failure Team  Pager [phone] (M-F; 7a - 4p)  Please contact [facility] for night-coverage after hours (4p -7a ) and weekends on [email or web address]  Patient seen with PA, agree with the above note.  He has had ongoing BRBPR.  Hgb 8.1 today after 2 units PRBCs.  Getting HD this morning.  Feels somewhat better.  Noted to have large number of PI events.  CXR appears to show some remodeling of LV.   On exam, trace ankle edema.  Heart irregular S1S2.  JVP 8 cm. Lungs clear.   Ongoing GI bleeding from suspected ileal AVM (ileal blood seen on capsule last admission). GI saw yesterday, think possible that retrograde double balloon endoscopy could be successful.  To that end, they discussed with GI at [facility] and patient will be transferred there for endoscopy, hopefully send today for procedure tomorrow.  - Will give 2 more units PRBCs with HD today.  - He is on octreotide and doxycycline as outpatient to try to limit AVM bleeding.  - INR goal has been 1.8-2.3.   Patient will have HD today prior to transfer.  If there is an issue with HD for LVAD patient at [facility], can return here after endoscopy.   Many PI events.  GI bleeding plays a role, but CXR also appears to show some remodeling of the LV.  I will empirically decrease speed to 9400 rpm this morning, will try to get formal ramp echo done later today before transfer.   Loralie Champagne 03/10/2018 9:02 AM

## 2018-03-10 NOTE — Progress Notes (Signed)
  Echocardiogram 2D Echocardiogram has been performed.  Johny Chess 03/10/2018, 2:02 PM

## 2018-03-10 NOTE — Progress Notes (Signed)
ANTICOAGULATION CONSULT NOTE - Follow-Up Consult  Pharmacy Consult for warfarin Indication: LVAD  No Known Allergies  Patient Measurements: Height: 5\' 5"  (165.1 cm) Weight: 179 lb 7.3 oz (81.4 kg)(bedwt.) IBW/kg (Calculated) : 61.5  Vital Signs: Temp: 97.6 F (36.4 C) (05/07 1040) Temp Source: Oral (05/07 0915) BP: 96/57 (05/07 1100) Pulse Rate: 64 (05/07 1040)  Labs: Recent Labs    03/09/18 1156 03/09/18 2155 03/10/18 0227  HGB 7.2* 7.9* 8.1*  HCT 21.9* 23.7* 24.4*  PLT 142*  --  139*  LABPROT 22.2*  --  23.3*  INR 1.97  --  2.09  CREATININE 2.30*  --  3.51*    Estimated Creatinine Clearance: 18.7 mL/min (A) (by C-G formula based on SCr of 3.51 mg/dL (H)).   Medical History: Past Medical History:  Diagnosis Date  . AICD (automatic cardioverter/defibrillator) present   . Asthma   . AVM (arteriovenous malformation) of colon 07/07/2017  . CHF (congestive heart failure) ([place])   . Diabetes ([place])   . ESRF (end stage renal failure) ([place]) 07/07/2017   pt receiving hemodialysis Monday- Wednesday-friday, sometimes Saturday  . Kidney disease   . LVAD (left ventricular assist device) present ([place]) 12/2016  . Permanent atrial fibrillation ([place]) 07/07/2017  . Presence of permanent cardiac pacemaker    AICD  . Sleep apnea     Assessment: 38 yoM s/p LVAD admitted with recurrent GIB. Pt on warfarin PTA with last dose 5/5. INR therapeutic at 1.9 on admit. INR remains within goal range today at 2.09, planning to transfer patient to [facility] for GI workup.  Goal of Therapy:  INR 1.8-2.3 Monitor platelets by anticoagulation protocol: Yes   Plan:  -Hold warfarin again tonight -F/U resuming AC post-GI procedures  Arrie Senate, PharmD, BCPS PGY-2 Cardiology Pharmacy Resident Pager: [phone] 03/10/2018

## 2018-03-11 LAB — TYPE AND SCREEN
ABO/RH(D): A NEG
Antibody Screen: NEGATIVE
UNIT DIVISION: 0
UNIT DIVISION: 0
Unit division: 0
Unit division: 0

## 2018-03-11 LAB — BPAM RBC
Blood Product Expiration Date: 201905212359
Blood Product Expiration Date: 201905232359
Blood Product Expiration Date: 201905232359
Blood Product Expiration Date: 201905232359
ISSUE DATE / TIME: 201905061646
ISSUE DATE / TIME: 201905070923
ISSUE DATE / TIME: 201905071005
ISSUE DATE / TIME: 201905061322
UNIT TYPE AND RH: 600
UNIT TYPE AND RH: 600
UNIT TYPE AND RH: 600
Unit Type and Rh: 600

## 2018-03-11 LAB — HEPATITIS B SURFACE ANTIGEN: HEP B S AG: NEGATIVE

## 2018-03-12 ENCOUNTER — Inpatient Hospital Stay (HOSPITAL_COMMUNITY)
Admission: RE | Admit: 2018-03-12 | Discharge: 2018-03-12 | Disposition: A | Discharge status: Home / Self Care | Payer: Medicare Other | Source: Ambulatory Visit | Attending: Cardiology | Admitting: Cardiology

## 2018-03-17 MED ORDER — ATORVASTATIN CALCIUM 40 MG PO TABS
40.00 | ORAL_TABLET | ORAL | Status: DC
Start: 2018-03-21 — End: 2018-03-17

## 2018-03-17 MED ORDER — CALCITRIOL 0.25 MCG PO CAPS
0.50 | ORAL_CAPSULE | ORAL | Status: DC
Start: 2018-03-23 — End: 2018-03-17

## 2018-03-17 MED ORDER — LIDOCAINE HCL 1 % IJ SOLN
0.50 | INTRAMUSCULAR | Status: DC
Start: ? — End: 2018-03-17

## 2018-03-17 MED ORDER — GENERIC EXTERNAL MEDICATION
1.00 | Status: DC
Start: ? — End: 2018-03-17

## 2018-03-17 MED ORDER — TRAZODONE HCL 50 MG PO TABS
100.00 | ORAL_TABLET | ORAL | Status: DC
Start: 2018-03-20 — End: 2018-03-17

## 2018-03-17 MED ORDER — ALBUTEROL SULFATE HFA 108 (90 BASE) MCG/ACT IN AERS
2.00 | INHALATION_SPRAY | RESPIRATORY_TRACT | Status: DC
Start: ? — End: 2018-03-17

## 2018-03-17 MED ORDER — FLUTICASONE PROPIONATE HFA 110 MCG/ACT IN AERO
2.00 | INHALATION_SPRAY | RESPIRATORY_TRACT | Status: DC
Start: 2018-03-20 — End: 2018-03-17

## 2018-03-17 MED ORDER — DANAZOL 100 MG PO CAPS
100.00 | ORAL_CAPSULE | ORAL | Status: DC
Start: 2018-03-20 — End: 2018-03-17

## 2018-03-17 MED ORDER — TRAMADOL HCL 50 MG PO TABS
25.00 | ORAL_TABLET | ORAL | Status: DC
Start: ? — End: 2018-03-17

## 2018-03-17 MED ORDER — INSULIN LISPRO 100 UNIT/ML ~~LOC~~ SOLN
.00 | SUBCUTANEOUS | Status: DC
Start: 2018-03-20 — End: 2018-03-17

## 2018-03-17 MED ORDER — GUAIFENESIN-DM 100-10 MG/5ML PO SYRP
5.00 | ORAL_SOLUTION | ORAL | Status: DC
Start: ? — End: 2018-03-17

## 2018-03-17 MED ORDER — GABAPENTIN 300 MG PO CAPS
300.00 | ORAL_CAPSULE | ORAL | Status: DC
Start: 2018-03-20 — End: 2018-03-17

## 2018-03-17 MED ORDER — DEXTROSE 50 % IV SOLN
12.50 | INTRAVENOUS | Status: DC
Start: ? — End: 2018-03-17

## 2018-03-17 MED ORDER — DOCUSATE SODIUM 100 MG PO CAPS
100.00 | ORAL_CAPSULE | ORAL | Status: DC
Start: 2018-03-21 — End: 2018-03-17

## 2018-03-17 MED ORDER — SILDENAFIL CITRATE 20 MG PO TABS
20.00 | ORAL_TABLET | ORAL | Status: DC
Start: 2018-03-17 — End: 2018-03-17

## 2018-03-17 MED ORDER — DIPHENHYDRAMINE HCL 25 MG PO CAPS
50.00 | ORAL_CAPSULE | ORAL | Status: DC
Start: ? — End: 2018-03-17

## 2018-03-17 MED ORDER — EPOETIN ALFA 4000 UNIT/ML IJ SOLN
50.00 | INTRAMUSCULAR | Status: DC
Start: ? — End: 2018-03-17

## 2018-03-17 MED ORDER — LEVOTHYROXINE SODIUM 25 MCG PO TABS
25.00 | ORAL_TABLET | ORAL | Status: DC
Start: 2018-03-21 — End: 2018-03-17

## 2018-03-17 MED ORDER — ACETAMINOPHEN 325 MG PO TABS
650.00 | ORAL_TABLET | ORAL | Status: DC
Start: ? — End: 2018-03-17

## 2018-03-17 MED ORDER — BUSPIRONE HCL 5 MG PO TABS
5.00 | ORAL_TABLET | ORAL | Status: DC
Start: 2018-03-20 — End: 2018-03-17

## 2018-03-17 MED ORDER — GENERIC EXTERNAL MEDICATION
40.00 | Status: DC
Start: 2018-03-20 — End: 2018-03-17

## 2018-03-18 MED ORDER — IPRATROPIUM-ALBUTEROL 0.5-2.5 (3) MG/3ML IN SOLN
3.00 | RESPIRATORY_TRACT | Status: DC
Start: ? — End: 2018-03-18

## 2018-03-18 MED ORDER — SILDENAFIL CITRATE 20 MG PO TABS
10.00 | ORAL_TABLET | ORAL | Status: DC
Start: 2018-03-18 — End: 2018-03-18

## 2018-03-20 ENCOUNTER — Other Ambulatory Visit: Payer: Self-pay

## 2018-03-20 ENCOUNTER — Inpatient Hospital Stay (HOSPITAL_COMMUNITY)
Admission: AD | Admit: 2018-03-20 | Discharge: 2018-04-01 | DRG: 377 | Disposition: A | Discharge status: Other Acute Inpatient Hospital | Payer: Medicare Other | Source: Other Acute Inpatient Hospital | Attending: Cardiology | Admitting: Cardiology

## 2018-03-20 ENCOUNTER — Encounter (HOSPITAL_COMMUNITY): Payer: Self-pay | Admitting: *Deleted

## 2018-03-20 DIAGNOSIS — M25562 Pain in left knee: Secondary | ICD-10-CM | POA: Diagnosis present

## 2018-03-20 DIAGNOSIS — I255 Ischemic cardiomyopathy: Secondary | ICD-10-CM | POA: Diagnosis present

## 2018-03-20 DIAGNOSIS — Z85828 Personal history of other malignant neoplasm of skin: Secondary | ICD-10-CM | POA: Diagnosis not present

## 2018-03-20 DIAGNOSIS — Z992 Dependence on renal dialysis: Secondary | ICD-10-CM | POA: Diagnosis not present

## 2018-03-20 DIAGNOSIS — Z8249 Family history of ischemic heart disease and other diseases of the circulatory system: Secondary | ICD-10-CM | POA: Diagnosis not present

## 2018-03-20 DIAGNOSIS — I472 Ventricular tachycardia: Secondary | ICD-10-CM | POA: Diagnosis not present

## 2018-03-20 DIAGNOSIS — K922 Gastrointestinal hemorrhage, unspecified: Secondary | ICD-10-CM | POA: Diagnosis not present

## 2018-03-20 DIAGNOSIS — D62 Acute posthemorrhagic anemia: Secondary | ICD-10-CM | POA: Diagnosis not present

## 2018-03-20 DIAGNOSIS — K5521 Angiodysplasia of colon with hemorrhage: Secondary | ICD-10-CM | POA: Diagnosis present

## 2018-03-20 DIAGNOSIS — Z6834 Body mass index (BMI) 34.0-34.9, adult: Secondary | ICD-10-CM

## 2018-03-20 DIAGNOSIS — G4733 Obstructive sleep apnea (adult) (pediatric): Secondary | ICD-10-CM | POA: Diagnosis present

## 2018-03-20 DIAGNOSIS — Z951 Presence of aortocoronary bypass graft: Secondary | ICD-10-CM

## 2018-03-20 DIAGNOSIS — K5730 Diverticulosis of large intestine without perforation or abscess without bleeding: Secondary | ICD-10-CM | POA: Diagnosis present

## 2018-03-20 DIAGNOSIS — N2581 Secondary hyperparathyroidism of renal origin: Secondary | ICD-10-CM | POA: Diagnosis present

## 2018-03-20 DIAGNOSIS — Z79891 Long term (current) use of opiate analgesic: Secondary | ICD-10-CM | POA: Diagnosis not present

## 2018-03-20 DIAGNOSIS — Z794 Long term (current) use of insulin: Secondary | ICD-10-CM

## 2018-03-20 DIAGNOSIS — N186 End stage renal disease: Secondary | ICD-10-CM | POA: Diagnosis present

## 2018-03-20 DIAGNOSIS — Z833 Family history of diabetes mellitus: Secondary | ICD-10-CM

## 2018-03-20 DIAGNOSIS — Z955 Presence of coronary angioplasty implant and graft: Secondary | ICD-10-CM

## 2018-03-20 DIAGNOSIS — I2510 Atherosclerotic heart disease of native coronary artery without angina pectoris: Secondary | ICD-10-CM | POA: Diagnosis present

## 2018-03-20 DIAGNOSIS — Z7901 Long term (current) use of anticoagulants: Secondary | ICD-10-CM | POA: Diagnosis not present

## 2018-03-20 DIAGNOSIS — Q2730 Arteriovenous malformation, site unspecified: Secondary | ICD-10-CM

## 2018-03-20 DIAGNOSIS — I9589 Other hypotension: Secondary | ICD-10-CM | POA: Diagnosis present

## 2018-03-20 DIAGNOSIS — K567 Ileus, unspecified: Secondary | ICD-10-CM | POA: Diagnosis present

## 2018-03-20 DIAGNOSIS — E869 Volume depletion, unspecified: Secondary | ICD-10-CM | POA: Diagnosis not present

## 2018-03-20 DIAGNOSIS — I5022 Chronic systolic (congestive) heart failure: Secondary | ICD-10-CM | POA: Diagnosis present

## 2018-03-20 DIAGNOSIS — Z952 Presence of prosthetic heart valve: Secondary | ICD-10-CM

## 2018-03-20 DIAGNOSIS — I132 Hypertensive heart and chronic kidney disease with heart failure and with stage 5 chronic kidney disease, or end stage renal disease: Secondary | ICD-10-CM | POA: Diagnosis present

## 2018-03-20 DIAGNOSIS — E1122 Type 2 diabetes mellitus with diabetic chronic kidney disease: Secondary | ICD-10-CM | POA: Diagnosis present

## 2018-03-20 DIAGNOSIS — D649 Anemia, unspecified: Secondary | ICD-10-CM | POA: Diagnosis not present

## 2018-03-20 DIAGNOSIS — M25462 Effusion, left knee: Secondary | ICD-10-CM | POA: Diagnosis present

## 2018-03-20 DIAGNOSIS — Z95811 Presence of heart assist device: Secondary | ICD-10-CM | POA: Diagnosis not present

## 2018-03-20 DIAGNOSIS — E11649 Type 2 diabetes mellitus with hypoglycemia without coma: Secondary | ICD-10-CM | POA: Diagnosis not present

## 2018-03-20 DIAGNOSIS — E669 Obesity, unspecified: Secondary | ICD-10-CM | POA: Diagnosis present

## 2018-03-20 DIAGNOSIS — Z0189 Encounter for other specified special examinations: Secondary | ICD-10-CM

## 2018-03-20 DIAGNOSIS — Z515 Encounter for palliative care: Secondary | ICD-10-CM | POA: Diagnosis not present

## 2018-03-20 DIAGNOSIS — I493 Ventricular premature depolarization: Secondary | ICD-10-CM | POA: Diagnosis not present

## 2018-03-20 DIAGNOSIS — Z86010 Personal history of colonic polyps: Secondary | ICD-10-CM

## 2018-03-20 DIAGNOSIS — Z79899 Other long term (current) drug therapy: Secondary | ICD-10-CM

## 2018-03-20 DIAGNOSIS — J449 Chronic obstructive pulmonary disease, unspecified: Secondary | ICD-10-CM | POA: Diagnosis present

## 2018-03-20 DIAGNOSIS — I482 Chronic atrial fibrillation: Secondary | ICD-10-CM | POA: Diagnosis present

## 2018-03-20 DIAGNOSIS — K921 Melena: Secondary | ICD-10-CM | POA: Diagnosis present

## 2018-03-20 DIAGNOSIS — D631 Anemia in chronic kidney disease: Secondary | ICD-10-CM | POA: Diagnosis present

## 2018-03-20 LAB — CBC
HCT: 25.7 % — ABNORMAL LOW (ref 39.0–52.0)
Hemoglobin: 8.2 g/dL — ABNORMAL LOW (ref 13.0–17.0)
MCH: 28.5 pg (ref 26.0–34.0)
MCHC: 31.9 g/dL (ref 30.0–36.0)
MCV: 89.2 fL (ref 78.0–100.0)
PLATELETS: 155 10*3/uL (ref 150–400)
RBC: 2.88 MIL/uL — ABNORMAL LOW (ref 4.22–5.81)
RDW: 18.6 % — AB (ref 11.5–15.5)
WBC: 9.3 10*3/uL (ref 4.0–10.5)

## 2018-03-20 LAB — LACTATE DEHYDROGENASE: LDH: 224 U/L — AB (ref 98–192)

## 2018-03-20 LAB — GLUCOSE, CAPILLARY: GLUCOSE-CAPILLARY: 175 mg/dL — AB (ref 65–99)

## 2018-03-20 LAB — PROTIME-INR
INR: 1.12
Prothrombin Time: 14.3 seconds (ref 11.4–15.2)

## 2018-03-20 LAB — PREPARE RBC (CROSSMATCH)

## 2018-03-20 MED ORDER — DANAZOL 100 MG PO CAPS: 100.0000 mg | ORAL_CAPSULE | Freq: Two times a day (BID) | ORAL | Status: DC

## 2018-03-20 MED ORDER — RENA-VITE PO TABS
1.0000 | ORAL_TABLET | Freq: Every day | ORAL | Status: DC
  Administered 2018-03-20 – 2018-03-31 (×12): 1 via ORAL
  Filled 2018-03-20 (×12): qty 1

## 2018-03-20 MED ORDER — SEVELAMER CARBONATE 800 MG PO TABS
2400.0000 mg | ORAL_TABLET | Freq: Three times a day (TID) | ORAL | Status: DC
  Administered 2018-03-21 – 2018-03-24 (×8): 2400 mg via ORAL
  Filled 2018-03-20 (×8): qty 3

## 2018-03-20 MED ORDER — MAGNESIUM OXIDE 400 (241.3 MG) MG PO TABS
400.0000 mg | ORAL_TABLET | Freq: Every day | ORAL | Status: DC
  Administered 2018-03-21 – 2018-04-01 (×12): 400 mg via ORAL
  Filled 2018-03-20 (×12): qty 1

## 2018-03-20 MED ORDER — INSULIN GLARGINE 100 UNIT/ML ~~LOC~~ SOLN
15.0000 [IU] | Freq: Every day | SUBCUTANEOUS | Status: DC
  Administered 2018-03-20 – 2018-03-23 (×4): 15 [IU] via SUBCUTANEOUS
  Filled 2018-03-20 (×4): qty 0.15

## 2018-03-20 MED ORDER — ACETAMINOPHEN 325 MG PO TABS
650.0000 mg | ORAL_TABLET | ORAL | Status: DC | PRN
  Administered 2018-03-21 – 2018-03-28 (×4): 650 mg via ORAL
  Filled 2018-03-20 (×5): qty 2

## 2018-03-20 MED ORDER — SEVELAMER CARBONATE 800 MG PO TABS
1600.0000 mg | ORAL_TABLET | Freq: Two times a day (BID) | ORAL | Status: DC | PRN
  Filled 2018-03-20 (×2): qty 2

## 2018-03-20 MED ORDER — CALCITRIOL 0.25 MCG PO CAPS
0.5000 ug | ORAL_CAPSULE | ORAL | Status: DC
  Administered 2018-03-23 – 2018-04-01 (×5): 0.5 ug via ORAL
  Filled 2018-03-20 (×5): qty 2

## 2018-03-20 MED ORDER — BUSPIRONE HCL 10 MG PO TABS
5.0000 mg | ORAL_TABLET | Freq: Two times a day (BID) | ORAL | Status: DC
  Administered 2018-03-20 – 2018-04-01 (×23): 5 mg via ORAL
  Filled 2018-03-20 (×11): qty 1
  Filled 2018-03-20: qty 0.5
  Filled 2018-03-20 (×2): qty 1
  Filled 2018-03-20 (×2): qty 0.5
  Filled 2018-03-20 (×5): qty 1
  Filled 2018-03-20: qty 0.5
  Filled 2018-03-20 (×4): qty 1
  Filled 2018-03-20: qty 0.5
  Filled 2018-03-20 (×2): qty 1

## 2018-03-20 MED ORDER — ALBUTEROL SULFATE (2.5 MG/3ML) 0.083% IN NEBU
2.5000 mg | INHALATION_SOLUTION | RESPIRATORY_TRACT | Status: DC | PRN
  Administered 2018-03-22: 2.5 mg via RESPIRATORY_TRACT
  Filled 2018-03-20: qty 3

## 2018-03-20 MED ORDER — GABAPENTIN 300 MG PO CAPS
300.0000 mg | ORAL_CAPSULE | Freq: Two times a day (BID) | ORAL | Status: DC
  Administered 2018-03-20 – 2018-04-01 (×24): 300 mg via ORAL
  Filled 2018-03-20 (×24): qty 1

## 2018-03-20 MED ORDER — ATORVASTATIN CALCIUM 40 MG PO TABS
40.0000 mg | ORAL_TABLET | Freq: Every day | ORAL | Status: DC
  Administered 2018-03-21 – 2018-03-31 (×11): 40 mg via ORAL
  Filled 2018-03-20 (×11): qty 1

## 2018-03-20 MED ORDER — TRAMADOL HCL 50 MG PO TABS
50.0000 mg | ORAL_TABLET | Freq: Four times a day (QID) | ORAL | Status: DC | PRN
  Administered 2018-03-20 – 2018-03-29 (×10): 50 mg via ORAL
  Filled 2018-03-20 (×10): qty 1

## 2018-03-20 MED ORDER — INSULIN ASPART 100 UNIT/ML ~~LOC~~ SOLN
0.0000 [IU] | Freq: Three times a day (TID) | SUBCUTANEOUS | Status: DC
  Administered 2018-03-22 – 2018-03-23 (×2): 2 [IU] via SUBCUTANEOUS
  Administered 2018-03-23 – 2018-03-24 (×2): 3 [IU] via SUBCUTANEOUS
  Administered 2018-03-24: 2 [IU] via SUBCUTANEOUS

## 2018-03-20 MED ORDER — SODIUM CHLORIDE 0.9 % IV SOLN: Freq: Once | INTRAVENOUS | Status: DC

## 2018-03-20 MED ORDER — LEVOTHYROXINE SODIUM 25 MCG PO TABS
25.0000 ug | ORAL_TABLET | Freq: Every day | ORAL | Status: DC
  Administered 2018-03-21 – 2018-04-01 (×13): 25 ug via ORAL
  Filled 2018-03-20 (×13): qty 1

## 2018-03-20 MED ORDER — OCTREOTIDE ACETATE 20 MG IM KIT: 20.0000 mg | PACK | INTRAMUSCULAR | Status: DC

## 2018-03-20 MED ORDER — ONDANSETRON HCL 4 MG/2ML IJ SOLN
4.0000 mg | Freq: Four times a day (QID) | INTRAMUSCULAR | Status: DC | PRN
  Administered 2018-03-20 – 2018-03-28 (×4): 4 mg via INTRAVENOUS
  Filled 2018-03-20 (×4): qty 2

## 2018-03-20 MED ORDER — PANTOPRAZOLE SODIUM 40 MG PO TBEC
40.0000 mg | DELAYED_RELEASE_TABLET | Freq: Every day | ORAL | Status: DC
  Administered 2018-03-21 – 2018-04-01 (×12): 40 mg via ORAL
  Filled 2018-03-20 (×12): qty 1

## 2018-03-20 MED ORDER — TRAZODONE HCL 50 MG PO TABS
100.0000 mg | ORAL_TABLET | Freq: Every evening | ORAL | Status: DC | PRN
  Administered 2018-03-22 – 2018-03-31 (×8): 100 mg via ORAL
  Filled 2018-03-20 (×8): qty 2

## 2018-03-20 MED ORDER — BUDESONIDE 0.25 MG/2ML IN SUSP
0.2500 mg | Freq: Two times a day (BID) | RESPIRATORY_TRACT | Status: DC
Start: 2018-03-20 — End: 2018-03-24
  Administered 2018-03-21 – 2018-03-22 (×4): 0.25 mg via RESPIRATORY_TRACT
  Filled 2018-03-20 (×7): qty 2

## 2018-03-20 MED ORDER — ACETAMINOPHEN 325 MG PO TABS: 650.0000 mg | ORAL_TABLET | ORAL | Status: DC | PRN

## 2018-03-20 NOTE — Progress Notes (Signed)
Patients Hgb is 8.2 and MD was notified. Given patients history of transfusion reaction and current Hgb level MD recommends waiting on results of AM labs to determine if blood needs to be administerd in the AM. Blood bank notified to hold unit that was prepared for pt. Pt needs Benadryl prior to infusion and there was no order present so this was also discussed with MD. Will continue to monitor.

## 2018-03-20 NOTE — Plan of Care (Signed)
New admit from [place], still having GIB, will continue to monitor

## 2018-03-20 NOTE — H&P (Addendum)
Advanced Heart Failure VAD History and Physical Note   PCP-Cardiologist: No primary care provider on file.   Reason for Admission: Symptomatic Anemia   HPI:   Trevor Maceachern Murphyis a 72 y.o.malewith a history of CAD s/p CABG x 4 2010, OSA, AS with TAVR 2015, ,ESRD on HD,DM2,chronicatrial fibrillation, asthma,GI bleed,and ischemic cardiomyopathy with Medtronic ICD.S/PHMII LVAD for Destination therapy on 12/14/2015.  GI events  2018 colonoscopy.  Blood throughout colon, but not in TI.  Oozing tiny AVMS at hepatic flexure, treated with APC, endoclips.  Three small small polyps not removed. Left colon tics.   02/27/18 EGD.  Normal, no bleeding.     02/27/18 Colonoscopy.    Blood in TI, sugg of SB source.  Blood throughout colon, no source found.  Descending and sigmoid tics.  Multiple small polyps of ascending to sigmoid, not removed.   02/28/18 Capsule endo.  Fresh blood at ileum, starting at 6 hours 58 mins.  Blood obscured identification of lesion.  Blood throughout colon. Per Dr Marin Shutter "Ileum (where bleeding) not reachable locally, and likely not reachable by anterograde double balloon enteroscopy at [facility]".Transfused 5 U PRBCs during admission.   At discharge continued on monthly octreotide and started Doxycycline 100 bid to try to limit AVM bleeding (and for URI sx) with planned transition to danazol.  Goal INR decreased to 1.8 - 2.3  Admitted to 03/09/2018 with recurrent GI bleed. Coumadin was stopped. He received 3UPRBCs. Transferred to [facility] for deep entersospcopy on 03/10/2018.  Hospital course at [facility] included double retro ballon scope and he was sent for  IR embolization x 3 but he had no evidence of bleeding. Received 12 UPRBCs. He remained off anticoagulants.   Transferred back to [facility]. Feels weak. Has had 2 bright red BMS since being back with frank blood. No melena. No ab pain. Mild SOB. No orthopnea or PND. Last HD yesterday   VAD INTERROGATION:  HeartMate II LVAD:  Flow 5.4   liters/min, speed 9400, power 6.0, PI 4.0.     Review of Systems: [y] = yes, [ ]  = no   General: Weight gain [ ] ; Weight loss [ ] ; Anorexia [ ] ; Fatigue [name] ]; Fever [ ] ; Chills [ ] ; Weakness [ Y]  Cardiac: Chest pain/pressure [ ] ; Resting SOB [ ] ; Exertional SOB [ ] ; Orthopnea [ ] ; Pedal Edema [name] ]; Palpitations [ ] ; Syncope [ ] ; Presyncope [ ] ; Paroxysmal nocturnal dyspnea[ ]   Pulmonary: Cough [ ] ; Wheezing[ ] ; Hemoptysis[ ] ; Sputum [ ] ; Snoring [ ]   GI: Vomiting[ ] ; Dysphagia[ ] ; Melena[ ] ; Hematochezia [ ] ; Heartburn[ ] ; Abdominal pain [ ] ; Constipation [ ] ; Diarrhea [ ] ; BRBPR [name] ]  GU: Hematuria[ ] ; Dysuria [ ] ; Nocturia[ ]  GI bleed Vascular: Pain in legs with walking [ ] ; Pain in feet with lying flat [ ] ; Non-healing sores [ ] ; Stroke [ ] ; TIA [ ] ; Slurred speech [ ] ;  Neuro: Headaches[ ] ; Vertigo[ ] ; Seizures[ ] ; Paresthesias[ ] ;Blurred vision [ ] ; Diplopia [ ] ; Vision changes [ ]   Ortho/Skin: Arthritis [name] ]; Joint pain [ Y]; Musclefain [ ] ; Joint swelling [ ] ; Back Pain [ ] ; Rash [ ]   Psych: Depression[ ] ; Anxiety[ ]   Heme: Bleeding problems [name] ]; Clotting disorders [ ] ; Anemia [ y]  Endocrine: Diabetes [ Y]; Thyroid dysfunction[ ]     Home Medications Prior to Admission medications   Medication Sig Start Date End Date Taking? Authorizing Provider  acetaminophen (TYLENOL) 325 MG tablet Take 2 tablets (650  mg total) by mouth every 4 (four) hours as needed for headache or mild pain. 03/10/18   Shirley Friar, PA-C  albuterol (PROVENTIL HFA;VENTOLIN HFA) 108 (90 Base) MCG/ACT inhaler Inhale 2 puffs into the lungs every 4 (four) hours as needed for wheezing or shortness of breath. 02/10/18   Larey Dresser, MD  atorvastatin (LIPITOR) 40 MG tablet TAKE 1 TABLET(40 MG) BY MOUTH DAILY Patient taking differently: Take 40 mg by mouth daily at 6 PM.  09/02/17   Larey Dresser, MD  atorvastatin (LIPITOR) 40 MG tablet Take 1 tablet (40 mg total) by mouth daily at 6 PM. 03/11/18   Larey Dresser, MD  busPIRone (BUSPAR) 5 MG tablet Take 1 tablet (5 mg total) by mouth 2 (two) times daily. 01/22/18   Ellyson Rarick, Shaune Pascal, MD  calcitRIOL (ROCALTROL) 0.5 MCG capsule Take 1 capsule (0.5 mcg total) by mouth every Monday, Wednesday, and Friday with hemodialysis. 03/11/18   Shirley Friar, PA-C  danazol (DANOCRINE) 100 MG capsule Take 1 capsule (100 mg total) by mouth 2 (two) times daily. 03/05/18   Larey Dresser, MD  docusate sodium (COLACE) 100 MG capsule Take 1 capsule (100 mg total) by mouth 2 (two) times daily. Patient taking differently: Take 100 mg daily by mouth.  04/03/17   Larey Dresser, MD  doxycycline (VIBRA-TABS) 100 MG tablet Take 1 tablet (100 mg total) by mouth every 12 (twelve) hours. 03/05/18   Clegg, Amy D, NP  ferric citrate (AURYXIA) 1 GM 210 MG(Fe) tablet Take 420 mg by mouth 3 (three) times daily with meals.    [provider]  fluticasone (FLOVENT HFA) 110 MCG/ACT inhaler Inhale 2 puffs into the lungs 2 (two) times daily. 02/10/18   Larey Dresser, MD  gabapentin (NEURONTIN) 600 MG tablet Take 0.5 tablets (300 mg total) by mouth 2 (two) times daily. 07/23/17   Larey Dresser, MD  insulin glargine (LANTUS) 100 unit/mL SOPN Inject 0.15 mLs (15 Units total) into the skin at bedtime. 03/05/18   Clegg, Amy D, NP  levothyroxine (SYNTHROID, LEVOTHROID) 25 MCG tablet Take 1 tablet (25 mcg total) by mouth daily before breakfast. 10/02/17   Larey Dresser, MD  magnesium oxide (MAG-OX) 400 MG tablet Take 400 mg by mouth daily.    [provider]  multivitamin (RENA-VIT) TABS tablet Take 1 tablet by mouth at bedtime. 09/02/17   Larey Dresser, MD  pantoprazole (PROTONIX) 40 MG tablet Take 1 tablet (40 mg total) by mouth daily. 08/11/17   Clegg, Amy D, NP  sevelamer carbonate (RENVELA) 800 MG tablet Take 1,600-2,400 mg by mouth See admin instructions. Take 2,400 mg by mouth three times a day with meals and 1,600 mg two times a day with snacks 02/03/18    [provider]  sildenafil (REVATIO) 20 MG tablet Take 2 tablets (40 mg total) 3 (three) times daily by mouth. Patient taking differently: Take 40 mg by mouth See admin instructions. Take two tablets (40 mg) by mouth three times a day- midday, afternoon, and bedtime 09/11/17   Larey Dresser, MD  traMADol (ULTRAM) 50 MG tablet Take 1 tablet (50 mg total) by mouth every 12 (twelve) hours as needed. TAKE 1 TABLET BY MOUTH EVERY 6 HOURS AS NEEDED FOR MODERATE PAIN Patient taking differently: Take 50 mg by mouth every 6 (six) hours as needed for moderate pain.  03/05/18   Clegg, Amy D, NP  traZODone (DESYREL) 100 MG tablet TAKE 1 TABLET  BY MOUTH EVERY NIGHT AT BEDTIME AS NEEDED FOR SLEEP Patient taking differently: TAKE 1 TABLET BY MOUTH EVERY NIGHT AT BEDTIME 02/16/18   Larey Dresser, MD    Past Medical History: Past Medical History:  Diagnosis Date  . AICD (automatic cardioverter/defibrillator) present   . Asthma   . AVM (arteriovenous malformation) of colon 07/07/2017  . CHF (congestive heart failure) ([place])   . Diabetes ([place])   . ESRF (end stage renal failure) ([place]) 07/07/2017   pt receiving hemodialysis Monday- Wednesday-friday, sometimes Saturday  . Kidney disease   . LVAD (left ventricular assist device) present ([place]) 12/2016  . Permanent atrial fibrillation ([place]) 07/07/2017  . Presence of permanent cardiac pacemaker    AICD  . Sleep apnea     Past Surgical History: Past Surgical History:  Procedure Laterality Date  . AORTIC VALVE REPLACEMENT  2015   [place]  . APPLICATION OF A-CELL OF EXTREMITY Left 11/20/2017   Procedure: APPLICATION OF A-CELL;  Surgeon: Wallace Going, DO;  Location: [place];  Service: Plastics;  Laterality: Left;  . AV FISTULA PLACEMENT Left 06/30/2017   Procedure: CREATION of LEFT ARM Brachiocephalic Fistula;  Surgeon: Conrad Hickman, MD;  Location: [place];  Service: Vascular;  Laterality: Left;  . CARDIAC CATHETERIZATION N/A 12/02/2016   Procedure:  Right Heart Cath;  Surgeon: Larey Dresser, MD;  Location: [place] CV LAB;  Service: Cardiovascular;  Laterality: N/A;  . COLONOSCOPY N/A 02/14/2017   Procedure: COLONOSCOPY;  Surgeon: Milus Banister, MD;  Location: [place];  Service: Endoscopy;  Laterality: N/A;  . COLONOSCOPY WITH PROPOFOL N/A 02/27/2018   Procedure: COLONOSCOPY WITH PROPOFOL;  Surgeon: Jerene Bears, MD;  [place];  Service: Gastroenterology;  Laterality: N/A;  . CORONARY ANGIOPLASTY WITH STENT PLACEMENT  2013   in [place]  . CORONARY ARTERY BYPASS GRAFT  2010   in [place]  . DIALYSIS/PERMA CATHETER INSERTION  06/30/2017   Procedure: INSERTION Dialysis Catheter;  Surgeon: Conrad Opdyke, MD;  Location[facility] OR;  Service: Vascular;;  . ESOPHAGOGASTRODUODENOSCOPY (EGD) WITH PROPOFOL N/A 02/27/2018   Procedure: ESOPHAGOGASTRODUODENOSCOPY (EGD) WITH PROPOFOL;  Surgeon: Jerene Bears, MD;  Location: [facility] ENDOSCOPY;  Service: Gastroenterology;  Laterality: N/A;  . GIVENS CAPSULE STUDY N/A 02/27/2018   Procedure: GIVENS CAPSULE STUDY;  Surgeon: Jerene Bears, MD;  Location: [place];  Service: Gastroenterology;  Laterality: N/A;  . IABP INSERTION N/A 12/11/2016   Procedure: IABP Insertion;  Surgeon: Larey Dresser, MD;  Location: [place] CV LAB;  Service: Cardiovascular;  Laterality: N/A;  . INSERTION OF IMPLANTABLE LEFT VENTRICULAR ASSIST DEVICE N/A 12/13/2016   Procedure: INSERTION OF IMPLANTABLE LEFT VENTRICULAR ASSIST DEVICE;  Surgeon: Ivin Poot, MD;  Location: [place];  Service: Open Heart Surgery;  Laterality: N/A;  HEARTMATE II  NITRIC OXIDE  . IR FLUORO GUIDE CV LINE LEFT  06/17/2017  . IR US GUIDE VASC ACCESS LEFT  06/17/2017  . MASS EXCISION Left 11/20/2017   Procedure: EXCISION OF LEFT NECK AND RIGHT LEG SKIN CANCER WITH A CELL PLACEMENT;  Surgeon: Wallace Going, DO;  Location: [place];  Service: Plastics;  Laterality: Left;  Marland Kitchen MASS EXCISION Left 11/26/2017   Procedure: EXPLORATION AND  EXCISION OF LEFT NECK;  Surgeon: Wallace Going, DO;  Location: [place];  Service: Plastics;  Laterality: Left;  . RIGHT HEART CATH N/A 12/11/2016   Procedure: Right Heart Cath;  Surgeon: Larey Dresser, MD;  Location: [place] CV LAB;  Service: Cardiovascular;  Laterality: N/A;  . RIGHT HEART CATH N/A 03/27/2017   Procedure: Right Heart Cath;  Surgeon: Larey Dresser, MD;  Location: [place] CV LAB;  Service: Cardiovascular;  Laterality: N/A;  . RIGHT HEART CATH N/A 06/13/2017   Procedure: RIGHT HEART CATH;  Surgeon: Jolaine Artist, MD;  Location: [place] CV LAB;  Service: Cardiovascular;  Laterality: N/A;  . TEE WITHOUT CARDIOVERSION N/A 12/13/2016   Procedure: TRANSESOPHAGEAL ECHOCARDIOGRAM (TEE);  Surgeon: Ivin Poot, MD;  Location: [place];  Service: Open Heart Surgery;  Laterality: N/A;  . TRICUSPID VALVE REPLACEMENT N/A 12/13/2016   Procedure: TRICUSPID VALVE REPAIR WITH EDWARDS MC 3 TRICUSPID ANNULOPLASTY RING MODEL 4900 SIZE T 28;  Surgeon: Ivin Poot, MD;  Location: [place];  Service: Open Heart Surgery;  Laterality: N/A;    Family History: Family History  Problem Relation Age of Onset  . Heart failure Father   . Heart attack Father   . Healthy Mother   . Diabetes Paternal Grandfather     Social History: Social History   Socioeconomic History  . Marital status: Widowed    Spouse name: Not on file  . Number of children: 0  . Years of education: 33  . Highest education level: Not on file  Occupational History  . Not on file  Social Needs  . Financial resource strain: Not on file  . Food insecurity:    Worry: Not on file    Inability: Not on file  . Transportation needs:    Medical: Not on file    Non-medical: Not on file  Tobacco Use  . Smoking status: Never Smoker  . Smokeless tobacco: Never Used  Substance and Sexual Activity  . Alcohol use: No  . Drug use: No  . Sexual activity: Not Currently  Lifestyle  . Physical activity:    Days per  week: Not on file    Minutes per session: Not on file  . Stress: Not on file  Relationships  . Social connections:    Talks on phone: Not on file    Gets together: Not on file    Attends religious service: Not on file    Active member of club or organization: Not on file    Attends meetings of clubs or organizations: Not on file    Relationship status: Not on file  Other Topics Concern  . Not on file  Social History Narrative   Patient is a widow. Moved to [place] from [place], [place]. Currently lives with his sister Daniela Hernan.    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     Allergies:  No Known Allergies  Objective:    Vital Signs:   Temp:  [98 F (36.7 C)-98.5 F (36.9 C)] 98.5 F (36.9 C) (05/17 1934) Pulse Rate:  [88-89] 89 (05/17 1934) Resp:  [19-20] 19 (05/17 1934) BP: (109-122)/(73-75) 122/73 (05/17 1934) SpO2:  [98 %-99 %] 99 % (05/17 1934) Weight:  [89.6 kg (197 lb 8.5 oz)] 89.6 kg (197 lb 8.5 oz) (05/17 1836) Last BM Date: 03/20/18 Filed Weights   03/20/18 1836  Weight: 89.6 kg (197 lb 8.5 oz)    Mean arterial Pressure 82  Physical Exam    General:   No resp difficulty HEENT: Normal x for pale sclera Neck: supple. JVP to jaw . Carotids 2+ bilat; no bruits. No lymphadenopathy or thyromegaly appreciated. Cor: Mechanical heart sounds with LVAD hum present. Lungs: Clear Abdomen: obese soft, nontender,  nondistended. No hepatosplenomegaly. No bruits or masses. Good bowel sounds. Driveline: C/D/I; securement device intact and driveline incorporated Extremities: no cyanosis, clubbing, rash, 2+ edema Neuro: alert & orientedx3, cranial nerves grossly intact. moves all 4 extremities w/o difficulty. Affect pleasant   Telemetry    AF 80s Personally reviewed    Labs    Basic Metabolic Panel: No results for input(s): NA, K, CL, CO2, GLUCOSE, BUN, CREATININE, CALCIUM, MG, PHOS in the last 168 hours.  Liver Function Tests: No results for  input(s): AST, ALT, ALKPHOS, BILITOT, PROT, ALBUMIN in the last 168 hours. No results for input(s): LIPASE, AMYLASE in the last 168 hours. No results for input(s): AMMONIA in the last 168 hours.  CBC: No results for input(s): WBC, NEUTROABS, HGB, HCT, MCV, PLT in the last 168 hours.  Cardiac Enzymes: No results for input(s): CKTOTAL, CKMB, CKMBINDEX, TROPONINI in the last 168 hours.  BNP: BNP (last 3 results) No results for input(s): BNP in the last 8760 hours.  ProBNP (last 3 results) No results for input(s): PROBNP in the last 8760 hours.   CBG: No results for input(s): GLUCAP in the last 168 hours.  Coagulation Studies: No results for input(s): LABPROT, INR in the last 72 hours.  Other results:   Imaging    No results found.    Patient Profile:   Trevor Deasis Murphyis a 72 y.o.malewith a history of CAD s/p CABG x 4 2010, OSA, AS with TAVR 2015, ,ESRD on HD,DM2,chronicatrial fibrillation, asthma,GI bleed,and ischemic cardiomyopathy with Medtronic ICD.S/PHMII LVAD for Destination therapy on 12/14/2015.  Assessment/Plan:    1. GI Bleed/Symptomatic Anemia -Double Retro Ballon Enteroscopy -S/P Attempted Embolization 5/10 and 03/16/18 No active bleeding identified.  Received multiple units of PRBCs.   Follow daily CBC  2. 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  Volume managed per HD No bb with RV failure. Continue sildenafil.   3. ESRD- M/W/F/Sat Nephrology consulted.  4. Chronic A fib Off coumadin with GI bleed.   5. HTN - MAPs ok. Watch carefully with bleeding   6. Squamous Cell CA - s/p excision   7. DMII Continue home regimen.  Add sliding scale.   8. H/O CAD CABG -no s/s angina  9. H/O TAVR   I reviewed the LVAD parameters from today, and compared the results to the patient's prior recorded data.  No programming changes were made.  The LVAD is functioning within specified parameters.  The  patient performs LVAD self-test daily.  LVAD interrogation was negative for any significant power changes, alarms or PI events/speed drops.  LVAD equipment check completed and is in good working order.  Back-up equipment present.   LVAD education done on emergency procedures and precautions and reviewed exit site care.  Length of Stay: 0  Glori Bickers, MD 03/20/2018, 7:39 PM  VAD Team Pager [phone] (7am - 7am) +++VAD ISSUES ONLY+++   Advanced Heart Failure Team Pager [phone] (M-F; [place])  Please contact [facility] for night-coverage after hours (4p -7a ) and weekends on [email or web address] for all non- LVAD Issues  Patient seen and examined with Darrick Grinder, NP. We discussed all aspects of the encounter. I agree with the assessment and plan as stated above.   He is s/p prolonged course at [facility] with ongoing LGIB has gotten 12u RBCs. Continues to bleed and is very pale on exam. Will get stat CBC and transfuse as needed. GI situation very difficult. May need to consider surgical options.  VAD interrogated personally. Parameters stable. Will likely need HD over the weekend. Hold coumadin   On exam very pale JVP to jaw LVAD hum Ab soft obese NT driveline ok Ext 2+ edema.   Glori Bickers, MD  7:43 PM

## 2018-03-21 LAB — GLUCOSE, CAPILLARY
GLUCOSE-CAPILLARY: 126 mg/dL — AB (ref 65–99)
GLUCOSE-CAPILLARY: 127 mg/dL — AB (ref 65–99)
Glucose-Capillary: 60 mg/dL — ABNORMAL LOW (ref 65–99)
Glucose-Capillary: 172 mg/dL — ABNORMAL HIGH (ref 65–99)
Glucose-Capillary: 180 mg/dL — ABNORMAL HIGH (ref 65–99)

## 2018-03-21 LAB — BASIC METABOLIC PANEL
Anion gap: 7 (ref 5–15)
BUN: 25 mg/dL — AB (ref 6–20)
CO2: 29 mmol/L (ref 22–32)
CREATININE: 4.48 mg/dL — AB (ref 0.61–1.24)
Calcium: 7.6 mg/dL — ABNORMAL LOW (ref 8.9–10.3)
Chloride: 102 mmol/L (ref 101–111)
GFR calc Af Amer: 14 mL/min — ABNORMAL LOW (ref >60)
GFR, EST NON AFRICAN AMERICAN: 12 mL/min — AB (ref >60)
GLUCOSE: 125 mg/dL — AB (ref 65–99)
POTASSIUM: 4.1 mmol/L (ref 3.5–5.1)
Sodium: 138 mmol/L (ref 135–145)

## 2018-03-21 LAB — CBC
HCT: 22.9 % — ABNORMAL LOW (ref 39.0–52.0)
HCT: 23.7 % — ABNORMAL LOW (ref 39.0–52.0)
Hemoglobin: 7.2 g/dL — ABNORMAL LOW (ref 13.0–17.0)
Hemoglobin: 7.5 g/dL — ABNORMAL LOW (ref 13.0–17.0)
MCH: 28.3 pg (ref 26.0–34.0)
MCH: 28.3 pg (ref 26.0–34.0)
MCHC: 31.4 g/dL (ref 30.0–36.0)
MCHC: 31.6 g/dL (ref 30.0–36.0)
MCV: 90.2 fL (ref 78.0–100.0)
MCV: 89.4 fL (ref 78.0–100.0)
Platelets: 132 10*3/uL — ABNORMAL LOW (ref 150–400)
Platelets: 148 K/uL — ABNORMAL LOW (ref 150–400)
RBC: 2.54 MIL/uL — AB (ref 4.22–5.81)
RBC: 2.65 MIL/uL — ABNORMAL LOW (ref 4.22–5.81)
RDW: 18.3 % — ABNORMAL HIGH (ref 11.5–15.5)
RDW: 18.3 % — ABNORMAL HIGH (ref 11.5–15.5)
WBC: 7.2 10*3/uL (ref 4.0–10.5)
WBC: 8.8 10*3/uL (ref 4.0–10.5)

## 2018-03-21 LAB — RENAL FUNCTION PANEL
Albumin: 2.4 g/dL — ABNORMAL LOW (ref 3.5–5.0)
Anion gap: 8 (ref 5–15)
BUN: 28 mg/dL — ABNORMAL HIGH (ref 6–20)
CO2: 27 mmol/L (ref 22–32)
Calcium: 7.6 mg/dL — ABNORMAL LOW (ref 8.9–10.3)
Chloride: 100 mmol/L — ABNORMAL LOW (ref 101–111)
Creatinine, Ser: 4.73 mg/dL — ABNORMAL HIGH (ref 0.61–1.24)
GFR calc Af Amer: 13 mL/min — ABNORMAL LOW (ref >60)
GFR calc non Af Amer: 11 mL/min — ABNORMAL LOW (ref >60)
Glucose, Bld: 75 mg/dL (ref 65–99)
Phosphorus: 2.7 mg/dL (ref 2.5–4.6)
Potassium: 4.3 mmol/L (ref 3.5–5.1)
Sodium: 135 mmol/L (ref 135–145)

## 2018-03-21 LAB — LACTATE DEHYDROGENASE: LDH: 191 U/L (ref 98–192)

## 2018-03-21 LAB — PREPARE RBC (CROSSMATCH)

## 2018-03-21 LAB — PROTIME-INR
INR: 1.21
Prothrombin Time: 15.2 seconds (ref 11.4–15.2)

## 2018-03-21 MED ORDER — OCTREOTIDE ACETATE 50 MCG/ML IJ SOLN
50.0000 ug | Freq: Three times a day (TID) | INTRAMUSCULAR | Status: DC
  Administered 2018-03-21 – 2018-03-22 (×6): 50 ug via SUBCUTANEOUS
  Filled 2018-03-21 (×8): qty 1

## 2018-03-21 MED ORDER — SODIUM CHLORIDE 0.9 % IV SOLN: Freq: Once | INTRAVENOUS | Status: DC

## 2018-03-21 MED ORDER — DANAZOL 100 MG PO CAPS
100.0000 mg | ORAL_CAPSULE | Freq: Two times a day (BID) | ORAL | Status: DC
  Administered 2018-03-23 – 2018-04-01 (×18): 100 mg via ORAL
  Filled 2018-03-21 (×31): qty 1

## 2018-03-21 MED ORDER — LIDOCAINE-PRILOCAINE 2.5-2.5 % EX CREA: 1.0000 "application " | TOPICAL_CREAM | CUTANEOUS | Status: DC | PRN

## 2018-03-21 MED ORDER — HEPARIN SODIUM (PORCINE) 1000 UNIT/ML DIALYSIS: 5800.0000 [IU] | Freq: Once | INTRAMUSCULAR | Status: DC

## 2018-03-21 MED ORDER — SODIUM CHLORIDE 0.9 % IV SOLN: 100.0000 mL | INTRAVENOUS | Status: DC | PRN

## 2018-03-21 MED ORDER — PENTAFLUOROPROP-TETRAFLUOROETH EX AERO: 1.0000 "application " | INHALATION_SPRAY | CUTANEOUS | Status: DC | PRN

## 2018-03-21 MED ORDER — SILDENAFIL CITRATE 20 MG PO TABS
40.0000 mg | ORAL_TABLET | Freq: Three times a day (TID) | ORAL | Status: DC
  Administered 2018-03-21 – 2018-04-01 (×33): 40 mg via ORAL
  Filled 2018-03-21 (×37): qty 2

## 2018-03-21 MED ORDER — DIPHENHYDRAMINE HCL 50 MG/ML IJ SOLN
25.0000 mg | Freq: Once | INTRAMUSCULAR | Status: AC
  Administered 2018-03-22: 25 mg via INTRAVENOUS
  Filled 2018-03-21: qty 1

## 2018-03-21 MED ORDER — LIDOCAINE HCL (PF) 1 % IJ SOLN: 5.0000 mL | INTRAMUSCULAR | Status: DC | PRN

## 2018-03-21 MED ORDER — DOXYCYCLINE HYCLATE 100 MG PO TABS: 100.0000 mg | ORAL_TABLET | Freq: Two times a day (BID) | ORAL | Status: DC

## 2018-03-21 NOTE — Progress Notes (Addendum)
Patient ID: Trevor [name], male   DOB: 08/17/1946, 72 y.o.   MRN: [id number]   Advanced Heart Failure VAD Team Note  PCP-Cardiologist: No primary care provider on file.   Subjective:    Still noting rectal bleeding.  Hgb down to 7.2 today.  Had HD last on Thursday.  MAP 70s-80s.    He is off warfarin for now, INR 1.2.   GI events  2018 colonoscopy. Blood throughout colon, but not in TI. Oozing tiny AVMS at hepatic flexure, treated with APC, endoclips. Three small small polyps not removed. Left colon tics.  02/27/18 EGD. Normal, no bleeding.  02/27/18 Colonoscopy. Blood in TI, sugg of SB source. Blood throughout colon, no source found. Descending and sigmoid tics. Multiple small polyps of ascending to sigmoid, not removed.  02/28/18 Capsule endo. Fresh blood at ileum, starting at 6 hours 58 mins. Blood obscured identification of lesion. Blood throughout colon. Per Dr Marin Shutter "Ileum (where bleeding) not reachable locally, and likely not reachable by anterograde double balloon enteroscopy at [facility]".Transfused 5 U PRBCs during admission.  At discharge continued on monthly octreotide and started Doxycycline 100 bid to try to limit AVM bleeding (and for URI sx)with plannedtransition to danazol. Goal INR decreased to 1.8 - 2.3  Admitted to 03/09/2018 with recurrent GI bleed. Coumadin was stopped. He received 3UPRBCs. Transferred to [facility] for deep entersospcopy on 03/10/2018.  Hospital course at [facility] included double retro ballon scope and he was sent for  IR embolization x 3 but he had no evidence of bleeding. Received 12 UPRBCs. He remained off anticoagulants.   LVAD INTERROGATION:  HeartMate 2 LVAD:   Flow 5.7 liters/min, speed 9200, power 5.9, PI 4.7, 8 PI events/24 hrs.    Objective:    Vital Signs:   Temp:  [98 F (36.7 C)-98.7 F (37.1 C)] 98.2 F (36.8 C) (05/18 0338) Pulse Rate:  [76-89] 76 (05/18 0338) Resp:  [18-20] 18 (05/18 0338) BP: (87-122)/(39-75) 88/70  (05/18 0338) SpO2:  [98 %-99 %] 99 % (05/18 0338) Weight:  [197 lb 8.5 oz (89.6 kg)] 197 lb 8.5 oz (89.6 kg) (05/17 1836) Last BM Date: 03/20/18 Mean arterial Pressure 70s-80s  Intake/Output:   Intake/Output Summary (Last 24 hours) at 03/21/2018 0631 Last data filed at 03/20/2018 2300 Gross per 24 hour  Intake 118 ml  Output -  Net 118 ml     Physical Exam    General:  Well appearing. No resp difficulty HEENT: normal Neck: supple. JVP 10. Carotids 2+ bilat; no bruits. No lymphadenopathy or thyromegaly appreciated. Cor: Mechanical heart sounds with LVAD hum present. Lungs: Occasional rhonchi Abdomen: soft, nontender, nondistended. No hepatosplenomegaly. No bruits or masses. Good bowel sounds. Driveline: C/D/I; securement device intact and driveline incorporated Extremities: no cyanosis, clubbing, rash. 1+ edema to knees.  Neuro: alert & orientedx3, cranial nerves grossly intact. moves all 4 extremities w/o difficulty. Affect pleasant   Telemetry   Atrial fibrillation in 70s (personally reviewed)   Labs   Basic Metabolic Panel: Recent Labs  Lab 03/21/18 0250  NA 138  K 4.1  CL 102  CO2 29  GLUCOSE 125*  BUN 25*  CREATININE 4.48*  CALCIUM 7.6*    Liver Function Tests: No results for input(s): AST, ALT, ALKPHOS, BILITOT, PROT, ALBUMIN in the last 168 hours. No results for input(s): LIPASE, AMYLASE in the last 168 hours. No results for input(s): AMMONIA in the last 168 hours.  CBC: Recent Labs  Lab 03/20/18 1951 03/21/18 0250  WBC 9.3 7.2  HGB 8.2* 7.2*  HCT 25.7* 22.9*  MCV 89.2 90.2  PLT 155 132*    INR: Recent Labs  Lab 03/20/18 2053 03/21/18 0250  INR 1.12 1.21    Other results:  EKG:    Imaging    No results found.   Medications:     Scheduled Medications: . atorvastatin  40 mg Oral q1800  . budesonide  0.25 mg Nebulization BID  . busPIRone  5 mg Oral BID  . [START ON 03/23/2018] calcitRIOL  0.5 mcg Oral Q M,W,F-HD  . danazol   100 mg Oral BID  . gabapentin  300 mg Oral BID  . insulin aspart  0-15 Units Subcutaneous TID WC  . insulin glargine  15 Units Subcutaneous QHS  . levothyroxine  25 mcg Oral QAC breakfast  . magnesium oxide  400 mg Oral Daily  . multivitamin  1 tablet Oral QHS  . octreotide  50 mcg Subcutaneous TID  . pantoprazole  40 mg Oral Daily  . sevelamer carbonate  2,400 mg Oral TID WC     Infusions: . sodium chloride    . sodium chloride       PRN Medications:  acetaminophen, albuterol, ondansetron (ZOFRAN) IV, sevelamer carbonate, traMADol, traZODone   Assessment/Plan:    1. GI Bleed/Symptomatic Anemia: GI AVMs. Double balloon retro endoscopy at [facility], unable to identify bleeding lesion.  He had failed attempted IR embolization x 3 while at [facility]. 12 units PRBCs at [facility]. He is still actively bleeding, hgb down to 7.2 today.  - Will transfuse 2 units with dialysis.  - Will leave off warfarin for the time being, INR 1.2.  LDH 191 this morning.  - With intractable bleeding, will consider surgical options.  Will ask general surgery to evaluate.  This was explored at [facility] but patient wanted to come back here.  - Continue [place] octreotide and danazol 100 mg bid.  2. Chronic Systolic Heart Failure/RV Failure: HMIII LVAD placed 2017.  VAD parameters stable this morning. LDH stable though he is off warfarin because of intractable GI bleeding.  - Volume managed per HD, will need today.  - No bb with RV failure. Continue sildenafil 40 mg tid.  3. ESRD- M/W/F/Sat.  He will need HD today, will consult nephrology.   4. Chronic A fib: Off coumadin with GI bleed.  5. HTN: MAPs ok. Watch carefully with bleeding  6. Squamous Cell CA: s/p excision, margins at neck site not clear but has not wanted radiation.  7. DMII - Continue home regimen.  - Add sliding scale.  8. H/O CAD CABG 9. H/O TAVR: Valve stable on last echo.   Mobilize with PT and cardiac rehab.   I reviewed the LVAD parameters from today,  and compared the results to the patient's prior recorded data.  No programming changes were made.  The LVAD is functioning within specified parameters.  The patient performs LVAD self-test daily.  LVAD interrogation was negative for any significant power changes, alarms or PI events/speed drops.  LVAD equipment check completed and is in good working order.  Back-up equipment present.   LVAD education done on emergency procedures and precautions and reviewed exit site care.  Length of Stay: 1  Loralie Champagne, MD 03/21/2018, 6:31 AM  VAD Team --- VAD ISSUES ONLY--- Pager [phone] (7am - 7am)  Advanced Heart Failure Team  Pager [phone] (M-F; 7a - 4p)  Please contact [facility] for night-coverage after hours (4p -7a ) and weekends on [email or web address]

## 2018-03-21 NOTE — Progress Notes (Signed)
Patient ID: Trevor [name], male   DOB: 04/17/1946, 72 y.o.   MRN: [id number]  Trevor [name] is known to our service from a recent consultation.  He has a complicated history including LVAD, renal failure, and recurrent GI bleed that seems to be coming from his ileum.  This was unable to be localized by GI work-up here.  He was sent to [facility] where he underwent double balloon enteroscopy as well as angiography and they failed to localize a site of bleeding.  He is now admitted and has ongoing GI bleeding.  We are asked to see him regarding any surgical options.  His abdomen is soft and nontender.  He does not want to have surgery at this time.  That being the case, I think the only surgical option would be a combined double balloon enteroscopy and surgical exploration which will have to be done at [facility].  We do not have the capabilities to do this here.  Please let us know if we can be of further assistance.  I spoke with him in detail about this situation and he is aware.  He reports that [name] told him that if he is to have any surgery it would have to be done at [facility].  Trevor Skeans, MD, MPH, FACS Trauma: [phone] General Surgery: [phone]

## 2018-03-21 NOTE — Progress Notes (Signed)
AM glucose 60, OJ given, RN notified. See glucose recheck within 1 hour.

## 2018-03-21 NOTE — Progress Notes (Signed)
Blood bank called and they have 2 units of PRBCs prepared for the patient. MD wants blood administered during dialysis. Pt also needs Benadryl before blood transfusion due to previous reaction.

## 2018-03-21 NOTE — Plan of Care (Signed)
Continue with plan of care.  

## 2018-03-21 NOTE — Progress Notes (Signed)
Patient's new medication Danadol (started @ [name]) is currently not available. Per pharmacy, it should be available 5/20.

## 2018-03-21 NOTE — Progress Notes (Signed)
[facility] Progress Note  Subjective: no c/o's  Vitals:   03/21/18 0338 03/21/18 0640 03/21/18 0730 03/21/18 0810  BP: (!) 88/70   (!) 75/62  Pulse: 76  81   Resp: 18  18   Temp: 98.2 F (36.8 C)   (!) 97.4 F (36.3 C)  TempSrc: Oral   Oral  SpO2: 99%  95%   Weight:  88.4 kg (194 lb 14.2 oz)    Height:        Inpatient medications: . atorvastatin  40 mg Oral q1800  . budesonide  0.25 mg Nebulization BID  . busPIRone  5 mg Oral BID  . [START ON 03/23/2018] calcitRIOL  0.5 mcg Oral Q M,W,F-HD  . danazol  100 mg Oral BID  . diphenhydrAMINE  25 mg Intravenous Once  . gabapentin  300 mg Oral BID  . heparin  5,800 Units Dialysis Once in dialysis  . insulin aspart  0-15 Units Subcutaneous TID WC  . insulin glargine  15 Units Subcutaneous QHS  . levothyroxine  25 mcg Oral QAC breakfast  . magnesium oxide  400 mg Oral Daily  . multivitamin  1 tablet Oral QHS  . octreotide  50 mcg Subcutaneous TID  . pantoprazole  40 mg Oral Daily  . sevelamer carbonate  2,400 mg Oral TID WC  . sildenafil  40 mg Oral TID   . sodium chloride    . sodium chloride    . sodium chloride    . sodium chloride     sodium chloride, sodium chloride, acetaminophen, albuterol, lidocaine (PF), lidocaine-prilocaine, ondansetron (ZOFRAN) IV, pentafluoroprop-tetrafluoroeth, sevelamer carbonate, traMADol, traZODone  Exam: Alert, no distress NO jvd Chest mostly clear Cor LVAD noise Abd soft ntnd obese Ext 2+ pitting bilat leg edema NF, ox 3 L AVF+bruit  Dialysis: [facility]at  4.5h   83kg (as of last admit)  2K/2Ca  L AVF Hep 5800 Venofer 100mg  IV x 5 (4/5 dosed) Mircera 82mcg IV q 2 weeks (last 4/17)  Calcitriol 0.71mcg PO TIW  Renvela 4 tabs tid qac, 2 bid q snacks  CXR 4/22 - clear CXR 4/27 - mild vasc congestion      Impression: 1  Anemia/ GIB - felt to be distal SB bleeding in ileum sp enteroscopy at [facility] and sent for embolization x 2 there but no bleeding seen; sp multiple  prbc's.  2  ESRD - on HD MWFSat for vol overload issues.  HD today 3  Volume - up 5-6kg 4  CM sp LVAD/ ICD 5  CAD hx CABG/ afib - per cardiology 6  Anemia of CKD - last esa here was darbe 100 ug on 5/7, will resume weekly   Plan - HD today, max UF , give prbc's w hd    Kelly Splinter MD [place] pager [phone]   03/21/2018, 11:46 AM   Recent Labs  Lab 03/21/18 0250 03/21/18 0815  NA 138 135  K 4.1 4.3  CL 102 100*  CO2 29 27  GLUCOSE 125* 75  BUN 25* 28*  CREATININE 4.48* 4.73*  CALCIUM 7.6* 7.6*  PHOS  --  2.7   Recent Labs  Lab 03/21/18 0815  ALBUMIN 2.4*   Recent Labs  Lab 03/20/18 1951 03/21/18 0250 03/21/18 0815  WBC 9.3 7.2 8.8  HGB 8.2* 7.2* 7.5*  HCT 25.7* 22.9* 23.7*  MCV 89.2 90.2 89.4  PLT 155 132* 148*   Iron/TIBC/Ferritin/ %Sat    Component Value Date/Time   IRON 125  06/14/2017 1605   TIBC 221 (L) 06/14/2017 1605   FERRITIN 622 (H) 06/14/2017 1605   IRONPCTSAT 57 (H) 06/14/2017 1605

## 2018-03-22 LAB — CBC WITH DIFFERENTIAL/PLATELET
ABS IMMATURE GRANULOCYTES: 0.1 10*3/uL (ref 0.0–0.1)
BASOS ABS: 0 10*3/uL (ref 0.0–0.1)
Basophils Relative: 0 %
Eosinophils Absolute: 0.1 10*3/uL (ref 0.0–0.7)
Eosinophils Relative: 1 %
HCT: 24.6 % — ABNORMAL LOW (ref 39.0–52.0)
HEMOGLOBIN: 8 g/dL — AB (ref 13.0–17.0)
Immature Granulocytes: 1 %
LYMPHS PCT: 8 %
Lymphs Abs: 0.8 10*3/uL (ref 0.7–4.0)
MCH: 28.3 pg (ref 26.0–34.0)
MCHC: 32.5 g/dL (ref 30.0–36.0)
MCV: 86.9 fL (ref 78.0–100.0)
MONO ABS: 0.8 10*3/uL (ref 0.1–1.0)
MONOS PCT: 9 %
NEUTROS ABS: 7.7 10*3/uL (ref 1.7–7.7)
Neutrophils Relative %: 81 %
Platelets: 162 10*3/uL (ref 150–400)
RBC: 2.83 MIL/uL — ABNORMAL LOW (ref 4.22–5.81)
RDW: 17.3 % — ABNORMAL HIGH (ref 11.5–15.5)
WBC: 9.6 10*3/uL (ref 4.0–10.5)

## 2018-03-22 LAB — PROTIME-INR
INR: 1.16
Prothrombin Time: 14.7 seconds (ref 11.4–15.2)

## 2018-03-22 LAB — RENAL FUNCTION PANEL
ANION GAP: 9 (ref 5–15)
Albumin: 2.1 g/dL — ABNORMAL LOW (ref 3.5–5.0)
BUN: 36 mg/dL — ABNORMAL HIGH (ref 6–20)
CO2: 25 mmol/L (ref 22–32)
Calcium: 7.2 mg/dL — ABNORMAL LOW (ref 8.9–10.3)
Chloride: 100 mmol/L — ABNORMAL LOW (ref 101–111)
Creatinine, Ser: 5.46 mg/dL — ABNORMAL HIGH (ref 0.61–1.24)
GFR calc Af Amer: 11 mL/min — ABNORMAL LOW (ref >60)
GFR calc non Af Amer: 9 mL/min — ABNORMAL LOW (ref >60)
GLUCOSE: 166 mg/dL — AB (ref 65–99)
POTASSIUM: 4.1 mmol/L (ref 3.5–5.1)
Phosphorus: 2.3 mg/dL — ABNORMAL LOW (ref 2.5–4.6)
Sodium: 134 mmol/L — ABNORMAL LOW (ref 135–145)

## 2018-03-22 LAB — CBC
HEMATOCRIT: 17.2 % — AB (ref 39.0–52.0)
HEMOGLOBIN: 5.5 g/dL — AB (ref 13.0–17.0)
MCH: 28.6 pg (ref 26.0–34.0)
MCHC: 32 g/dL (ref 30.0–36.0)
MCV: 89.6 fL (ref 78.0–100.0)
Platelets: 144 10*3/uL — ABNORMAL LOW (ref 150–400)
RBC: 1.92 MIL/uL — ABNORMAL LOW (ref 4.22–5.81)
RDW: 17.8 % — ABNORMAL HIGH (ref 11.5–15.5)
WBC: 6.8 10*3/uL (ref 4.0–10.5)

## 2018-03-22 LAB — PREPARE RBC (CROSSMATCH)

## 2018-03-22 LAB — GLUCOSE, CAPILLARY
GLUCOSE-CAPILLARY: 126 mg/dL — AB (ref 65–99)
GLUCOSE-CAPILLARY: 101 mg/dL — AB (ref 65–99)
GLUCOSE-CAPILLARY: 198 mg/dL — AB (ref 65–99)
GLUCOSE-CAPILLARY: 109 mg/dL — AB (ref 65–99)

## 2018-03-22 LAB — LACTATE DEHYDROGENASE: LDH: 174 U/L (ref 98–192)

## 2018-03-22 MED ORDER — SODIUM CHLORIDE 0.9 % IV SOLN: Freq: Once | INTRAVENOUS | Status: DC

## 2018-03-22 MED ORDER — SODIUM CHLORIDE 0.9 % IV SOLN
100.0000 mL | INTRAVENOUS | Status: DC | PRN
Start: 2018-03-22 — End: 2018-03-22

## 2018-03-22 MED ORDER — DIPHENHYDRAMINE HCL 50 MG/ML IJ SOLN
INTRAMUSCULAR | Status: AC
  Administered 2018-03-22: 25 mg
  Filled 2018-03-22: qty 1

## 2018-03-22 MED ORDER — DARBEPOETIN ALFA 100 MCG/0.5ML IJ SOSY
100.0000 ug | PREFILLED_SYRINGE | INTRAMUSCULAR | Status: DC
  Administered 2018-03-23 – 2018-03-30 (×2): 100 ug via INTRAVENOUS
  Filled 2018-03-22 (×4): qty 0.5

## 2018-03-22 MED ORDER — LIDOCAINE-PRILOCAINE 2.5-2.5 % EX CREA: 1.0000 "application " | TOPICAL_CREAM | CUTANEOUS | Status: DC | PRN

## 2018-03-22 MED ORDER — PENTAFLUOROPROP-TETRAFLUOROETH EX AERO: 1.0000 "application " | INHALATION_SPRAY | CUTANEOUS | Status: DC | PRN

## 2018-03-22 NOTE — Progress Notes (Addendum)
Md made aware of pt not getting PRBC yet since HD postponed.  Will continue to monitor stools.  Will check am labs and FU with any critical labs with MdI If Hd continues to be postponed.   If HGB stable will hold  until pt has HD to give blood.  Saunders Revel T

## 2018-03-22 NOTE — Procedures (Signed)
   I was present at this dialysis session, have reviewed the session itself and made  appropriate changes Kelly Splinter MD [place] pager [phone]   03/22/2018, 9:34 AM

## 2018-03-22 NOTE — Progress Notes (Signed)
Hd Called for estimated time, approx 45 min.  Will continue to monitor.  Pt wants to wait for lab draws until getting HD.  Saunders Revel T

## 2018-03-22 NOTE — Progress Notes (Signed)
Patient ID: Trevor [name], male   DOB: 1946-05-19, 72 y.o.   MRN: [id number]   Advanced Heart Failure VAD Team Note  PCP-Cardiologist: No primary care provider on file.   Subjective:    Still with maroon stool.  Unfortunately never got dialyzed yesterday so was never given his 2 units PRBCs and hgb down to 5.5 this morning.  He feels awful with painful leg swelling.  He has started HD this morning and has 1 unit PRBCs in, 2nd running.  Seen by general surgery yesterday, high risk for any surgical intervention would have to send back to [facility].     He is off warfarin for now, INR 1.1.   GI events  2018 colonoscopy. Blood throughout colon, but not in TI. Oozing tiny AVMS at hepatic flexure, treated with APC, endoclips. Three small small polyps not removed. Left colon tics.  02/27/18 EGD. Normal, no bleeding.  02/27/18 Colonoscopy. Blood in TI, sugg of SB source. Blood throughout colon, no source found. Descending and sigmoid tics. Multiple small polyps of ascending to sigmoid, not removed.  02/28/18 Capsule endo. Fresh blood at ileum, starting at 6 hours 58 mins. Blood obscured identification of lesion. Blood throughout colon. Per Dr Marin Shutter "Ileum (where bleeding) not reachable locally, and likely not reachable by anterograde double balloon enteroscopy at [facility]".Transfused 5 U PRBCs during admission.  At discharge continued on monthly octreotide and started Doxycycline 100 bid to try to limit AVM bleeding (and for URI sx)with plannedtransition to danazol. Goal INR decreased to 1.8 - 2.3  Admitted to 03/09/2018 with recurrent GI bleed. Coumadin was stopped. He received 3UPRBCs. Transferred to [facility] for deep entersospcopy on 03/10/2018.  Hospital course at [facility] included double retro ballon scope and he was sent for  IR embolization x 3 but he had no evidence of bleeding. Received 12 UPRBCs. He remained off anticoagulants.   LVAD INTERROGATION:  HeartMate 2 LVAD:   Flow 6.0  liters/min, speed 9200, power 6.1, PI 3.9, 8-10 PI events/24 hrs.    Objective:    Vital Signs:   Temp:  [97.4 F (36.3 C)-98.7 F (37.1 C)] 98 F (36.7 C) (05/19 0800) Pulse Rate:  [39-87] 74 (05/19 0800) Resp:  [18-24] 22 (05/19 0800) BP: (70-96)/(24-80) 70/58 (05/19 0800) SpO2:  [99 %-100 %] 100 % (05/19 0800) Weight:  [198 lb 13.7 oz (90.2 kg)] 198 lb 13.7 oz (90.2 kg) (05/19 0524) Last BM Date: 03/21/18 Mean arterial Pressure 80s  Intake/Output:   Intake/Output Summary (Last 24 hours) at 03/22/2018 0809 Last data filed at 03/22/2018 0800 Gross per 24 hour  Intake 1570 ml  Output 152 ml  Net 1418 ml     Physical Exam    GENERAL: Getting HD.  HEENT: Normal. NECK: Supple, JVP 10 cm. Carotids OK.  CARDIAC:  Mechanical heart sounds with LVAD hum present.  LUNGS:  CTAB, normal effort.  ABDOMEN:  NT, ND, no HSM. No bruits or masses. +BS  LVAD exit site: Well-healed and incorporated. Dressing dry and intact. No erythema or drainage. Stabilization device present and accurately applied. Driveline dressing changed daily per sterile technique. EXTREMITIES:  Warm and dry. No cyanosis, clubbing, rash.  2+ edema to knees.  NEUROLOGIC:  Alert & oriented x 3. Cranial nerves grossly intact. Moves all 4 extremities w/o difficulty. Affect pleasant     Telemetry   Atrial fibrillation in 70s with PVCs (personally reviewed)   Labs   Basic Metabolic Panel: Recent Labs  Lab 03/21/18 0250 03/21/18 0815 03/22/18 2979  NA 138 135 134*  K 4.1 4.3 4.1  CL 102 100* 100*  CO2 29 27 25   GLUCOSE 125* 75 166*  BUN 25* 28* 36*  CREATININE 4.48* 4.73* 5.46*  CALCIUM 7.6* 7.6* 7.2*  PHOS  --  2.7 2.3*    Liver Function Tests: Recent Labs  Lab 03/21/18 0815 03/22/18 0608  ALBUMIN 2.4* 2.1*   No results for input(s): LIPASE, AMYLASE in the last 168 hours. No results for input(s): AMMONIA in the last 168 hours.  CBC: Recent Labs  Lab 03/20/18 1951 03/21/18 0250 03/21/18 0815  03/22/18 0608  WBC 9.3 7.2 8.8 6.8  HGB 8.2* 7.2* 7.5* 5.5*  HCT 25.7* 22.9* 23.7* 17.2*  MCV 89.2 90.2 89.4 89.6  PLT 155 132* 148* 144*    INR: Recent Labs  Lab 03/20/18 2053 03/21/18 0250 03/22/18 0608  INR 1.12 1.21 1.16    Other results:  EKG:    Imaging   No results found.   Medications:     Scheduled Medications: . atorvastatin  40 mg Oral q1800  . budesonide  0.25 mg Nebulization BID  . busPIRone  5 mg Oral BID  . [START ON 03/23/2018] calcitRIOL  0.5 mcg Oral Q M,W,F-HD  . danazol  100 mg Oral BID  . gabapentin  300 mg Oral BID  . heparin  5,800 Units Dialysis Once in dialysis  . insulin aspart  0-15 Units Subcutaneous TID WC  . insulin glargine  15 Units Subcutaneous QHS  . levothyroxine  25 mcg Oral QAC breakfast  . magnesium oxide  400 mg Oral Daily  . multivitamin  1 tablet Oral QHS  . octreotide  50 mcg Subcutaneous TID  . pantoprazole  40 mg Oral Daily  . sevelamer carbonate  2,400 mg Oral TID WC  . sildenafil  40 mg Oral TID    Infusions: . sodium chloride    . sodium chloride    . sodium chloride    . sodium chloride      PRN Medications: sodium chloride, sodium chloride, acetaminophen, albuterol, lidocaine (PF), lidocaine-prilocaine, ondansetron (ZOFRAN) IV, pentafluoroprop-tetrafluoroeth, sevelamer carbonate, traMADol, traZODone   Assessment/Plan:    1. GI Bleed/Symptomatic Anemia: GI AVMs. Double balloon retro endoscopy at [facility], unable to identify bleeding lesion.  He had failed attempted IR embolization x 3 while at [facility]. 12 units PRBCs at [facility]. He is still actively bleeding with maroon stool, hgb down to 5.5 today as there was a delay in getting blood yesterday due to dialysis issues.  - Getting his 2 units PRBCs currently, will recheck CBC at noon, transfuse hgb < 8.   - Will leave off warfarin for the time being, INR 1.1.  LDH 174 this morning.  - He is not interested in surgery at this time, would have to be done at [facility] if  this changes (seen by general surgery here yesterday).  - Continue Krupp octreotide and danazol 100 mg bid.  2. Chronic Systolic Heart Failure/RV Failure: HMIII LVAD placed 2017.  VAD parameters stable this morning. LDH stable though he is off warfarin because of intractable GI bleeding.  - Volume managed per HD, getting today and will need again tomorrow.  - No bb with RV failure. Continue sildenafil 40 mg tid.  3. ESRD- M/W/F/Sat.  Currently getting HD, unfortunately delayed from yesterday.  Will need again tomorrow.  4. Chronic A fib: Off coumadin with GI bleed.  5. HTN: MAPs ok. Watch carefully with bleeding  6. Squamous Cell CA: s/p  excision, margins at neck site not clear but has not wanted radiation.  7. DMII - Continue home regimen.  8. H/O CAD CABG 9. H/O TAVR: Valve stable on last echo.   Mobilize with PT and cardiac rehab.   I reviewed the LVAD parameters from today, and compared the results to the patient's prior recorded data.  No programming changes were made.  The LVAD is functioning within specified parameters.  The patient performs LVAD self-test daily.  LVAD interrogation was negative for any significant power changes, alarms or PI events/speed drops.  LVAD equipment check completed and is in good working order.  Back-up equipment present.   LVAD education done on emergency procedures and precautions and reviewed exit site care.  Length of Stay: 2  Loralie Champagne, MD 03/22/2018, 8:09 AM  VAD Team --- VAD ISSUES ONLY--- Pager [phone] (7am - 7am)  Advanced Heart Failure Team  Pager [phone] (M-F; 7a - 4p)  Please contact [facility] for night-coverage after hours (4p -7a ) and weekends on [email or web address]

## 2018-03-22 NOTE — Progress Notes (Addendum)
[facility] Progress Note  Subjective: didn't get hd or blood yest today hb was 5.5 and is now 8 after 2u prbc's given w hd this am.. on hd this am took off about 3L net and bp's dropped low and pt felt bad towards the end but post hd bp's are much better  Vitals:   03/22/18 0930 03/22/18 0945 03/22/18 1200 03/22/18 1253  BP: (!) 63/30   (!) 45/29  Pulse: 72 78  87  Resp: 20 20    Temp:    98.5 F (36.9 C)  TempSrc:    Oral  SpO2: 99% 99% 99% (!) 89%  Weight:      Height:        Inpatient medications: . atorvastatin  40 mg Oral q1800  . budesonide  0.25 mg Nebulization BID  . busPIRone  5 mg Oral BID  . [START ON 03/23/2018] calcitRIOL  0.5 mcg Oral Q M,W,F-HD  . danazol  100 mg Oral BID  . gabapentin  300 mg Oral BID  . heparin  5,800 Units Dialysis Once in dialysis  . insulin aspart  0-15 Units Subcutaneous TID WC  . insulin glargine  15 Units Subcutaneous QHS  . levothyroxine  25 mcg Oral QAC breakfast  . magnesium oxide  400 mg Oral Daily  . multivitamin  1 tablet Oral QHS  . octreotide  50 mcg Subcutaneous TID  . pantoprazole  40 mg Oral Daily  . sevelamer carbonate  2,400 mg Oral TID WC  . sildenafil  40 mg Oral TID   . sodium chloride    . sodium chloride    . sodium chloride    . sodium chloride     sodium chloride, sodium chloride, acetaminophen, albuterol, lidocaine (PF), lidocaine-prilocaine, ondansetron (ZOFRAN) IV, pentafluoroprop-tetrafluoroeth, sevelamer carbonate, traMADol, traZODone  Exam: Alert, no distress NO jvd Chest mostly clear Cor LVAD noise Abd soft ntnd obese Ext 1-2+ pitting bilat leg edema NF, ox 3 L AVF+bruit  Dialysis: [facility]at  4.5h   83kg (as of last admit)  2K/2Ca  L AVF Hep 5800 (holding due to gib) Venofer 100mg  IV x 5 (4/5 dosed) Mircera 13mcg IV q 2 weeks (last 4/17)  Calcitriol 0.54mcg PO TIW  Renvela 4 tabs tid qac, 2 bid q snacks  CXR 4/22 - clear CXR 4/27 - mild vasc congestion       Impression: 1  Anemia/ GIB - felt to be distal SB bleeding in ileum sp enteroscopy at [facility] and sent for embolization x 2 there but no bleeding seen; sp multiple prbc's and continues to have melena; got 2u prbc's this am primary team is holding coumadin due to gib 2  ESRD - HD MWF + Sat for vol issues. No hep due to gib. HD today off sched 3  Volume - up 6-7kg pre hd this am 4  CM sp LVAD/ ICD 5  CAD hx CABG/ afib - per cardiology 6  Anemia of CKD - last esa darbe 100 ug on 5/7, will resume weekly darbe 100 ug on mondays   Plan - HD today, and tomorrow to get back on schedule   Kelly Splinter MD [place] pager [phone]   03/22/2018, 2:14 PM   Recent Labs  Lab 03/21/18 0250 03/21/18 0815 03/22/18 0608  NA 138 135 134*  K 4.1 4.3 4.1  CL 102 100* 100*  CO2 29 27 25   GLUCOSE 125* 75 166*  BUN 25* 28* 36*  CREATININE 4.48* 4.73*  5.46*  CALCIUM 7.6* 7.6* 7.2*  PHOS  --  2.7 2.3*   Recent Labs  Lab 03/21/18 0815 03/22/18 0608  ALBUMIN 2.4* 2.1*   Recent Labs  Lab 03/21/18 0815 03/22/18 0608 03/22/18 1206  WBC 8.8 6.8 9.6  NEUTROABS  --   --  7.7  HGB 7.5* 5.5* 8.0*  HCT 23.7* 17.2* 24.6*  MCV 89.4 89.6 86.9  PLT 148* 144* 162   Iron/TIBC/Ferritin/ %Sat    Component Value Date/Time   IRON 125 06/14/2017 1605   TIBC 221 (L) 06/14/2017 1605   FERRITIN 622 (H) 06/14/2017 1605   IRONPCTSAT 57 (H) 06/14/2017 1605

## 2018-03-22 NOTE — Progress Notes (Signed)
HD tx initiated via 15G button hole w/o problem, pull/push/flush well w/o problem, VSS w/ soft bp, although we are using MAP ather than bp, will cont to monitor while on HD tx

## 2018-03-22 NOTE — Progress Notes (Signed)
PT Cancellation Note  Patient Details Name: Trevor [name] MRN: [id number] DOB: 1946/08/26   Cancelled Treatment:    Reason Eval/Treat Not Completed: Medical issues which prohibited therapy(Hgb 5.5 not medically appropriate at this time)   [place] 03/22/2018, 6:52 AM  [name], [place]

## 2018-03-22 NOTE — Progress Notes (Signed)
Pt extremely frustrated at HD having been postponed until tonight only to have it postponed again. Pt educated on the need for the delay of HD.  Pt still frustrated and feels like he's not important.    Charge RN made aware.  Pt's feet swollen 2-3+ pitting edema.  VS stable.  Per pharmacy ok to give 2200 medications. Will continue to monitor Saunders Revel T

## 2018-03-22 NOTE — Progress Notes (Signed)
Md page critical hgb 5.5.  Awaiting call back.  Hd in process, first unit PRBC hung. Saunders Revel T

## 2018-03-23 ENCOUNTER — Inpatient Hospital Stay (HOSPITAL_COMMUNITY): Payer: Medicare Other

## 2018-03-23 LAB — BASIC METABOLIC PANEL
Anion gap: 7 (ref 5–15)
BUN: 20 mg/dL (ref 6–20)
CO2: 30 mmol/L (ref 22–32)
CREATININE: 3.67 mg/dL — AB (ref 0.61–1.24)
Calcium: 7.3 mg/dL — ABNORMAL LOW (ref 8.9–10.3)
Chloride: 98 mmol/L — ABNORMAL LOW (ref 101–111)
GFR calc Af Amer: 18 mL/min — ABNORMAL LOW (ref >60)
GFR, EST NON AFRICAN AMERICAN: 15 mL/min — AB (ref >60)
GLUCOSE: 93 mg/dL (ref 65–99)
Potassium: 3.6 mmol/L (ref 3.5–5.1)
SODIUM: 135 mmol/L (ref 135–145)

## 2018-03-23 LAB — GLUCOSE, CAPILLARY
Glucose-Capillary: 137 mg/dL — ABNORMAL HIGH (ref 65–99)
Glucose-Capillary: 157 mg/dL — ABNORMAL HIGH (ref 65–99)
Glucose-Capillary: 47 mg/dL — ABNORMAL LOW (ref 65–99)
Glucose-Capillary: 133 mg/dL — ABNORMAL HIGH (ref 65–99)
Glucose-Capillary: 102 mg/dL — ABNORMAL HIGH (ref 65–99)
Glucose-Capillary: 136 mg/dL — ABNORMAL HIGH (ref 65–99)

## 2018-03-23 LAB — PROTIME-INR
INR: 1.17
PROTHROMBIN TIME: 14.8 s (ref 11.4–15.2)

## 2018-03-23 LAB — CBC
HCT: 25 % — ABNORMAL LOW (ref 39.0–52.0)
Hemoglobin: 8.1 g/dL — ABNORMAL LOW (ref 13.0–17.0)
MCH: 28.5 pg (ref 26.0–34.0)
MCHC: 32.4 g/dL (ref 30.0–36.0)
MCV: 88 fL (ref 78.0–100.0)
PLATELETS: 155 10*3/uL (ref 150–400)
RBC: 2.84 MIL/uL — ABNORMAL LOW (ref 4.22–5.81)
RDW: 17.4 % — AB (ref 11.5–15.5)
WBC: 8.6 10*3/uL (ref 4.0–10.5)

## 2018-03-23 LAB — PREPARE RBC (CROSSMATCH)

## 2018-03-23 LAB — LACTATE DEHYDROGENASE: LDH: 223 U/L — AB (ref 98–192)

## 2018-03-23 MED ORDER — IOPAMIDOL (ISOVUE-370) INJECTION 76%
INTRAVENOUS | Status: AC
  Administered 2018-03-23: 100 mL
  Filled 2018-03-23: qty 100

## 2018-03-23 MED ORDER — SODIUM CHLORIDE 0.9 % IV SOLN
50.0000 ug/h | INTRAVENOUS | Status: DC
  Administered 2018-03-23 – 2018-03-31 (×16): 50 ug/h via INTRAVENOUS
  Filled 2018-03-23 (×34): qty 1

## 2018-03-23 MED ORDER — SODIUM CHLORIDE 0.9 % IV SOLN
Freq: Once | INTRAVENOUS | Status: AC
  Administered 2018-03-23: 11:00:00 via INTRAVENOUS

## 2018-03-23 NOTE — Progress Notes (Signed)
LVAD Coordinator Rounding Note:  Re-admitted from [facility] on 03/20/18 with continuing GI bleed.    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 Darcey Nora under Destination Therapy criteria due to age excluding heart transplantation.  Vital signs: Temp:  97.7 HR: 74 Doppler Pressure: 70 Automatic BP:  92/76 (82)  O2 Sat: 99% RA Wt: 197>194>192 lbs  LVAD interrogation reveals:  Speed:  9200 Flow:  5.4 Power:  5.8w PI: 4.5 Alarms: none Events:  60 PI events on 03/22/18 Fixed speed: 9200 Low speed limit: 8600  Primary Controller: Replace back up battery in 7 months Back up controller: Replace back up battery in 55month   Drive Line: Right abdominal Sorbaview dressing with Tegaderm covering intact. Anchor intact and accurately applied. Pt has weekly dressing changes using weekly kit; rinse with sterile saline after Chloraprep applicator, no skin prep or Bio patch used due to skin irritation.   Weekly dressing changes per bedside RN - next dressing change due 03/29/18.  Labs:  LDH trend: 224>191>174>223  INR trend: coumadin stopped  Hgb trend: 9.5>8.2>7.2>5.5>8.0>8.1  Anticoagulation Plan: - INR Goal: coumadin stopped - ASA Dose: none due to hx of GI bleed - OP monthly Octreotide for hx of GI bleed - Doxy 100 bid started 4/19 hospitalization to try and limit AVM bleeding  Blood Products:  - 12 units PC's while at [facility]- 03/22/18> 2 units PC's - 03/23/18> 1 unit PC  Device: - Medtronic single lead -Therapies: on 231 bpm  Arrythmias: chronic afib  Renal:  [facility] Adverse Events on VAD: - 02/2017> GIB- AVM clipped x2 - 07/2017> renal failure- HD started - 11/2017>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 Margins not clear at neck. - 02/23/18> hospitalization for GI bleed. Colon/EGD 02/27/18 with normal EGD. Colonoscopy identified active bleeding in ileum; source not found. Capsule Endoscopy  02/27/18: Showed active bleeding in the ileum. Received 5 units blood with histamine reaction; will give benadryl as OP.  Plan/Recommendations: 1. Will start Octreotide gtt for bleeding. 2. IR consult to evaluate for embolization.  3  Call VAD pager if any questions re: VAD equipment or drive line site issues.   MZada GirtRN, VAD Coordinator 24/7 VAD pager: [phone]

## 2018-03-23 NOTE — Progress Notes (Signed)
CTA obtained this afternoon and results reviewed with Dr. Barbie Banner.  No active bleeding identified.  No procedure planned in IR at this time.   Patient informed.    Brynda Greathouse, MS RD PA-C 1:28 PM

## 2018-03-23 NOTE — Progress Notes (Signed)
[facility] NEPHROLOGY PROGRESS NOTE  Assessment/ Plan: Pt is a 72 y.o. yo male  with ESRD on hemodialysis MWF at [facility]. He has complex past medical history of CAD, CABG, AS S/P TAVR, ischemic cardiomyopathy with ICD, S/P LVAD placement, here with recurrent GI bleed.Coumadin was stopped. He received blood transfusion.  Transferred to [facility] for deep entersospcopy on 03/10/2018. Hospital course at [facility] double retro ballon scope and he was sent for IR embolization x 3but he had no evidence of bleeding.  Assessment/Plan:  #Anemia/GI bleed: Status post multiple red blood cell transfusion.  Hemoglobin stable at 8.1 today.  Plan for another unit of blood transfusion during dialysis per cardiology.  On Aranesp 100 micrograms weekly.  # ESRD: Monday Wednesday Friday, plan for dialysis today. 3K, 2.25 ca, 2.5 to 3 kg as tolerated, no heparin.  Left AV fistula for access  # Secondary hyperparathyroidism: Calcium 7.3, phosphorus 2.3.  Continue calcitriol.  # HTN/volume: Patient is volume overload.  Try to do ultrafiltration if blood pressure tolerated.  Blood pressure on the lower side.  Continue to monitor.  #CAD, hx CABG, LVAD/ afib per cardiology  Subjective: Seen and examined at bedside.  Reported he still having GI bleed.  No nausea vomiting, chest pain or shortness of breath. Objective Vital signs in last 24 hours: Vitals:   03/23/18 0000 03/23/18 0032 03/23/18 0332 03/23/18 0700  BP: (!) 66/49  (!) 78/68 92/76  Pulse:   78 (!) 55  Resp:  20 18   Temp:  (!) 97.5 F (36.4 C) 98.2 F (36.8 C) 98.2 F (36.8 C)  TempSrc:  Axillary Oral Oral  SpO2:  96% 97%   Weight:   87.2 kg (192 lb 3.9 oz)   Height:       Weight change: -2.1 kg (-4 lb 10.1 oz)  Intake/Output Summary (Last 24 hours) at 03/23/2018 0952 Last data filed at 03/22/2018 2209 Gross per 24 hour  Intake 1325 ml  Output 2950 ml  Net -1625 ml       Labs: Basic Metabolic  Panel: Recent Labs  Lab 03/21/18 0815 03/22/18 0608 03/23/18 0108  NA 135 134* 135  K 4.3 4.1 3.6  CL 100* 100* 98*  CO2 27 25 30   GLUCOSE 75 166* 93  BUN 28* 36* 20  CREATININE 4.73* 5.46* 3.67*  CALCIUM 7.6* 7.2* 7.3*  PHOS 2.7 2.3*  --    Liver Function Tests: Recent Labs  Lab 03/21/18 0815 03/22/18 0608  ALBUMIN 2.4* 2.1*   No results for input(s): LIPASE, AMYLASE in the last 168 hours. No results for input(s): AMMONIA in the last 168 hours. CBC: Recent Labs  Lab 03/21/18 0250 03/21/18 0815 03/22/18 0608 03/22/18 1206 03/23/18 0108  WBC 7.2 8.8 6.8 9.6 8.6  NEUTROABS  --   --   --  7.7  --   HGB 7.2* 7.5* 5.5* 8.0* 8.1*  HCT 22.9* 23.7* 17.2* 24.6* 25.0*  MCV 90.2 89.4 89.6 86.9 88.0  PLT 132* 148* 144* 162 155   Cardiac Enzymes: No results for input(s): CKTOTAL, CKMB, CKMBINDEX, TROPONINI in the last 168 hours. CBG: Recent Labs  Lab 03/22/18 0808 03/22/18 1255 03/22/18 1645 03/22/18 2201 03/23/18 0808  GLUCAP 126* 101* 198* 109* 137*    Iron Studies: No results for input(s): IRON, TIBC, TRANSFERRIN, FERRITIN in the last 72 hours. Studies/Results: No results found.  Medications: Infusions: . sodium chloride    . sodium chloride    . sodium chloride    .  sodium chloride    . octreotide  (SANDOSTATIN)    IV infusion      Scheduled Medications: . atorvastatin  40 mg Oral q1800  . budesonide  0.25 mg Nebulization BID  . busPIRone  5 mg Oral BID  . calcitRIOL  0.5 mcg Oral Q M,W,F-HD  . danazol  100 mg Oral BID  . darbepoetin (ARANESP) injection - DIALYSIS  100 mcg Intravenous Q Mon-HD  . gabapentin  300 mg Oral BID  . insulin aspart  0-15 Units Subcutaneous TID WC  . insulin glargine  15 Units Subcutaneous QHS  . levothyroxine  25 mcg Oral QAC breakfast  . magnesium oxide  400 mg Oral Daily  . multivitamin  1 tablet Oral QHS  . pantoprazole  40 mg Oral Daily  . sevelamer carbonate  2,400 mg Oral TID WC  . sildenafil  40 mg Oral TID     have reviewed scheduled and prn medications.  Physical Exam: General:NAD, comfortable Heart:RRR, s1s2 nl Lungs:clear b/l, no cracjle Abdomen:soft, Non-tender, non-distended Extremities: Lower extremities  pitting edema ++ Dialysis Access: Left AV fistula has good thrill and bruit.  Dron Prasad Bhandari 03/23/2018,9:52 AM  LOS: 3 days

## 2018-03-23 NOTE — Care Management Note (Signed)
Case Management Note  Patient Details  Name: Trevor [name] MRN: [id number] Date of Birth: July 29, 1946  Subjective/Objective:       Pt admitted from [place] with continuous GI bleed             Action/Plan:  PTA independent from home, on outpt HD.  Pt has PCP and current prescription insurance.  CM will continue to follow   Expected Discharge Date:                  Expected Discharge Plan:  Home/Self Care  In-House Referral:     Discharge planning Services  CM Consult  Post Acute Care Choice:    Choice offered to:     DME Arranged:    DME Agency:     HH Arranged:    HH Agency:     Status of Service:  In process, will continue to follow  If discussed at Long Length of Stay Meetings, dates discussed:    Additional Comments:  [name], RN 03/23/2018, 8:48 AM

## 2018-03-23 NOTE — Plan of Care (Signed)
  Problem: Education: Goal: Knowledge of General Education information will improve Outcome: Progressing   Problem: Health Behavior/Discharge Planning: Goal: Ability to manage health-related needs will improve Outcome: Progressing   Problem: Clinical Measurements: Goal: Ability to maintain clinical measurements within normal limits will improve Outcome: Progressing Goal: Will remain free from infection Outcome: Progressing Goal: Diagnostic test results will improve Outcome: Progressing Goal: Cardiovascular complication will be avoided Outcome: Progressing   Problem: Activity: Goal: Risk for activity intolerance will decrease Outcome: Progressing   Problem: Coping: Goal: Level of anxiety will decrease Outcome: Progressing   Problem: Pain Managment: Goal: General experience of comfort will improve Outcome: Progressing   Problem: Skin Integrity: Goal: Risk for impaired skin integrity will decrease Outcome: Progressing   Problem: Cardiac: Goal: LVAD will function as expected and patient will experience no clinical alarms Outcome: Progressing

## 2018-03-23 NOTE — Consult Note (Signed)
Chief Complaint: Patient was seen in consultation today for GI bleed  Referring Physician(s): [name]  Supervising Physician: Marybelle Killings  Patient Status: [facility] - In-pt  History of Present Illness: Trevor [name] is a 72 y.o. 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  He was transferred to [facility] 03/10/18 for further evaluation. He underwent double balloon entersoscopy to identify bleeding source and IR angio/embolization attempt x2 which were ultimately unsuccessful.  He was transferred back to [facility] for ongoing management.  Since return to [facility] he has had 2 episodes of BRBPR and required another blood transfusion.  IR consulted for repeat mesenteric angiogram with possible embolization.    Past Medical History:  Diagnosis Date  . AICD (automatic cardioverter/defibrillator) present   . Asthma   . AVM (arteriovenous malformation) of colon 07/07/2017  . CHF (congestive heart failure) ([place])   . Diabetes ([place])   . ESRF (end stage renal failure) ([place]) 07/07/2017   pt receiving hemodialysis Monday- Wednesday-friday, sometimes Saturday  . Kidney disease   . LVAD (left ventricular assist device) present ([place]) 12/2016  . Permanent atrial fibrillation ([place]) 07/07/2017  . Presence of permanent cardiac pacemaker    AICD  . Sleep apnea     Past Surgical History:  Procedure Laterality Date  . AORTIC VALVE REPLACEMENT  2015   [place]  . APPLICATION OF A-CELL OF EXTREMITY Left 11/20/2017   Procedure: APPLICATION OF A-CELL;  Surgeon: Wallace Going, DO;  Location: [place];  Service: Plastics;  Laterality: Left;  . AV FISTULA PLACEMENT Left 06/30/2017   Procedure: CREATION of LEFT ARM Brachiocephalic Fistula;  Surgeon: Conrad [place], MD;  Location: [place];  Service: Vascular;  Laterality: Left;  . CARDIAC CATHETERIZATION N/A 12/02/2016   Procedure: Right Heart Cath;  Surgeon: Larey Dresser, MD;  Location: [place] CV LAB;  Service: Cardiovascular;  Laterality: N/A;  . COLONOSCOPY N/A 02/14/2017   Procedure: COLONOSCOPY;  Surgeon: Milus Banister, MD;  Location: [place];  Service: Endoscopy;  Laterality: N/A;  . COLONOSCOPY WITH PROPOFOL N/A 02/27/2018   Procedure: COLONOSCOPY WITH PROPOFOL;  Surgeon: Jerene Bears, MD;  Location: [place];  Service: Gastroenterology;  Laterality: N/A;  . CORONARY ANGIOPLASTY WITH STENT PLACEMENT  2013   in [place]  . CORONARY ARTERY BYPASS GRAFT  2010   in [place]  . DIALYSIS/PERMA CATHETER INSERTION  06/30/2017   Procedure: INSERTION Dialysis Catheter;  Surgeon: Conrad [place], MD;  Location: [facility] OR;  Service: Vascular;;  . ESOPHAGOGASTRODUODENOSCOPY (EGD) WITH PROPOFOL N/A 02/27/2018   Procedure: ESOPHAGOGASTRODUODENOSCOPY (EGD) WITH PROPOFOL;  Surgeon: Jerene Bears, MD;  Location: [facility] ENDOSCOPY;  Service: Gastroenterology;  Laterality: N/A;  . GIVENS CAPSULE STUDY N/A 02/27/2018   Procedure: GIVENS CAPSULE STUDY;  Surgeon: Jerene Bears, MD;  Location: [place];  Service: Gastroenterology;  Laterality: N/A;  . IABP INSERTION N/A 12/11/2016   Procedure: IABP Insertion;  Surgeon: Larey Dresser, MD;  Location: [place] CV LAB;  Service: Cardiovascular;  Laterality: N/A;  . INSERTION OF IMPLANTABLE LEFT VENTRICULAR ASSIST DEVICE N/A 12/13/2016   Procedure: INSERTION OF IMPLANTABLE LEFT VENTRICULAR ASSIST DEVICE;  Surgeon: Ivin Poot, MD;  Location: [place];  Service: Open Heart Surgery;  Laterality: N/A;  HEARTMATE II  NITRIC OXIDE  . IR FLUORO GUIDE CV LINE LEFT  06/17/2017  .  IR US GUIDE VASC ACCESS LEFT  06/17/2017  . MASS EXCISION Left 11/20/2017   Procedure: EXCISION OF LEFT NECK AND RIGHT LEG SKIN CANCER WITH A CELL PLACEMENT;  Surgeon: Wallace Going, DO;  Location: [place];  Service: Plastics;  Laterality: Left;  Marland Kitchen MASS EXCISION Left 11/26/2017   Procedure: EXPLORATION  AND EXCISION OF LEFT NECK;  Surgeon: Wallace Going, DO;  Location: [place];  Service: Plastics;  Laterality: Left;  . RIGHT HEART CATH N/A 12/11/2016   Procedure: Right Heart Cath;  Surgeon: Larey Dresser, MD;  Location: [place] CV LAB;  Service: Cardiovascular;  Laterality: N/A;  . RIGHT HEART CATH N/A 03/27/2017   Procedure: Right Heart Cath;  Surgeon: Larey Dresser, MD;  Location: [place] CV LAB;  Service: Cardiovascular;  Laterality: N/A;  . RIGHT HEART CATH N/A 06/13/2017   Procedure: RIGHT HEART CATH;  Surgeon: Jolaine Artist, MD;  Location: [place] CV LAB;  Service: Cardiovascular;  Laterality: N/A;  . TEE WITHOUT CARDIOVERSION N/A 12/13/2016   Procedure: TRANSESOPHAGEAL ECHOCARDIOGRAM (TEE);  Surgeon: Ivin Poot, MD;  Location: [place];  Service: Open Heart Surgery;  Laterality: N/A;  . TRICUSPID VALVE REPLACEMENT N/A 12/13/2016   Procedure: TRICUSPID VALVE REPAIR WITH EDWARDS MC 3 TRICUSPID ANNULOPLASTY RING MODEL 4900 SIZE T 28;  Surgeon: Ivin Poot, MD;  Location: [place];  Service: Open Heart Surgery;  Laterality: N/A;    Allergies: Patient has no known allergies.  Medications: Prior to Admission medications   Medication Sig Start Date End Date Taking? Authorizing Provider  acetaminophen (TYLENOL) 325 MG tablet Take 2 tablets (650 mg total) by mouth every 4 (four) hours as needed for headache or mild pain. 03/10/18  Yes Shirley Friar, PA-C  albuterol (PROVENTIL HFA;VENTOLIN HFA) 108 (90 Base) MCG/ACT inhaler Inhale 2 puffs into the lungs every 4 (four) hours as needed for wheezing or shortness of breath. 02/10/18  Yes Larey Dresser, MD  atorvastatin (LIPITOR) 40 MG tablet TAKE 1 TABLET(40 MG) BY MOUTH DAILY Patient taking differently: Take 40 mg by mouth daily at 6 PM.  09/02/17  Yes Larey Dresser, MD  atorvastatin (LIPITOR) 40 MG tablet Take 1 tablet (40 mg total) by mouth daily at 6 PM. 03/11/18  Yes Larey Dresser, MD  busPIRone (BUSPAR) 5  MG tablet Take 1 tablet (5 mg total) by mouth 2 (two) times daily. 01/22/18  Yes Bensimhon, Shaune Pascal, MD  calcitRIOL (ROCALTROL) 0.5 MCG capsule Take 1 capsule (0.5 mcg total) by mouth every Monday, Wednesday, and Friday with hemodialysis. 03/11/18  Yes Shirley Friar, PA-C  danazol (DANOCRINE) 100 MG capsule Take 1 capsule (100 mg total) by mouth 2 (two) times daily. 03/05/18  Yes Larey Dresser, MD  docusate sodium (COLACE) 100 MG capsule Take 1 capsule (100 mg total) by mouth 2 (two) times daily. Patient taking differently: Take 100 mg daily by mouth.  04/03/17  Yes Larey Dresser, MD  doxycycline (VIBRA-TABS) 100 MG tablet Take 1 tablet (100 mg total) by mouth every 12 (twelve) hours. 03/05/18  Yes Clegg, Amy D, NP  epoetin alfa (EPOGEN) 4000 UNIT/ML injection Inject 3,600 Units into the vein See admin instructions. For dialysis 03/20/18  Yes [provider]  ferric citrate (AURYXIA) 1 GM 210 MG(Fe) tablet Take 420 mg by mouth 3 (three) times daily with meals.   Yes [provider]  fluticasone (FLOVENT HFA) 110 MCG/ACT inhaler Inhale 2 puffs into the lungs 2 (  two) times daily. 02/10/18  Yes Larey Dresser, MD  gabapentin (NEURONTIN) 600 MG tablet Take 0.5 tablets (300 mg total) by mouth 2 (two) times daily. 07/23/17  Yes Larey Dresser, MD  insulin glargine (LANTUS) 100 unit/mL SOPN Inject 0.15 mLs (15 Units total) into the skin at bedtime. 03/05/18  Yes Clegg, Amy D, NP  levothyroxine (SYNTHROID, LEVOTHROID) 25 MCG tablet Take 1 tablet (25 mcg total) by mouth daily before breakfast. 10/02/17  Yes Larey Dresser, MD  magnesium oxide (MAG-OX) 400 MG tablet Take 400 mg by mouth daily.   Yes [provider]  multivitamin (RENA-VIT) TABS tablet Take 1 tablet by mouth at bedtime. 09/02/17  Yes Larey Dresser, MD  pantoprazole (PROTONIX) 40 MG tablet Take 1 tablet (40 mg total) by mouth daily. 08/11/17  Yes Clegg, Amy D, NP  sevelamer carbonate (RENVELA) 800 MG tablet  Take 1,600-2,400 mg by mouth See admin instructions. Take 2,400 mg by mouth three times a day with meals and 1,600 mg two times a day with snacks 02/03/18  Yes [provider]  sildenafil (REVATIO) 20 MG tablet Take 2 tablets (40 mg total) 3 (three) times daily by mouth. Patient taking differently: Take 40 mg by mouth See admin instructions. Take two tablets (40 mg) by mouth three times a day- midday, afternoon, and bedtime 09/11/17  Yes Larey Dresser, MD  traMADol (ULTRAM) 50 MG tablet Take 1 tablet (50 mg total) by mouth every 12 (twelve) hours as needed. TAKE 1 TABLET BY MOUTH EVERY 6 HOURS AS NEEDED FOR MODERATE PAIN Patient taking differently: Take 50 mg by mouth every 6 (six) hours as needed for moderate pain.  03/05/18  Yes Clegg, Amy D, NP  traZODone (DESYREL) 100 MG tablet TAKE 1 TABLET BY MOUTH EVERY NIGHT AT BEDTIME AS NEEDED FOR SLEEP Patient taking differently: TAKE 1 TABLET BY MOUTH EVERY NIGHT AT BEDTIME 02/16/18  Yes Larey Dresser, MD     Family History  Problem Relation Age of Onset  . Heart failure Father   . Heart attack Father   . Healthy Mother   . Diabetes Paternal Grandfather     Social History   Socioeconomic History  . Marital status: Widowed    Spouse name: Not on file  . Number of children: 0  . Years of education: 53  . Highest education level: Not on file  Occupational History  . Not on file  Social Needs  . Financial resource strain: Not on file  . Food insecurity:    Worry: Not on file    Inability: Not on file  . Transportation needs:    Medical: Not on file    Non-medical: Not on file  Tobacco Use  . Smoking status: Never Smoker  . Smokeless tobacco: Never Used  Substance and Sexual Activity  . Alcohol use: No  . Drug use: No  . Sexual activity: Not Currently  Lifestyle  . Physical activity:    Days per week: Not on file    Minutes per session: Not on file  . Stress: Not on file  Relationships  . Social connections:    Talks  on phone: Not on file    Gets together: Not on file    Attends religious service: Not on file    Active member of club or organization: Not on file    Attends meetings of clubs or organizations: Not on file    Relationship status: Not on file  Other Topics  Concern  . Not on file  Social History Narrative   Patient is a widow. Moved to [place] from [place], [place]. Currently lives with his sister Meric Joye.    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      Review of Systems: A 12 point ROS discussed and pertinent positives are indicated in the HPI above.  All other systems are negative.  Review of Systems  Constitutional: Negative for fatigue and fever.  Respiratory: Negative for cough and shortness of breath.   Cardiovascular: Negative for chest pain.  Gastrointestinal: Positive for blood in stool. Negative for abdominal pain and nausea.  Musculoskeletal: Negative for back pain.  Psychiatric/Behavioral: Negative for behavioral problems and confusion.    Vital Signs: BP (!) 88/55   Pulse 74   Temp 97.7 F (36.5 C) (Oral)   Resp 18   Ht 5\' 5"  (1.651 m)   Wt 192 lb 3.9 oz (87.2 kg)   SpO2 99%   BMI 31.99 kg/m   Physical Exam  Constitutional: He is oriented to person, place, and time. He appears well-developed.  Cardiovascular:  LVAD hum present  Pulmonary/Chest: Effort normal and breath sounds normal. No respiratory distress.  Abdominal: Soft. He exhibits no distension. There is no tenderness.  Neurological: He is alert and oriented to person, place, and time.  Skin: Skin is warm and dry.  Psychiatric: He has a normal mood and affect. His behavior is normal. Judgment and thought content normal.  Nursing note and vitals reviewed.    MD Evaluation Airway: WNL Heart: WNL Abdomen: WNL Chest/ Lungs: WNL ASA  Classification: 3 Mallampati/Airway Score: One   Imaging: Dg Chest 2 View  Result Date: 02/23/2018 CLINICAL DATA:  Shortness of breath.  EXAM: CHEST - 2 VIEW COMPARISON:  11/24/2017. FINDINGS: Cardiomegaly, ventricular assist device, tricuspid and aortic valve replacements, along with AICD all in good position. No consolidation or edema. Minimal blunting LEFT CP angle. Previous median sternotomy for CABG. No osseous findings. IMPRESSION: Stable chest. Electronically Signed   By: Staci Righter M.D.   On: 02/23/2018 17:54   Dg Chest Portable 1 View  Result Date: 03/09/2018 CLINICAL DATA:  LVAD EXAM: PORTABLE CHEST 1 VIEW COMPARISON:  02/28/2018 FINDINGS: Cardiopericardial enlargement is stable. There are changes of CABG, aortic valve replacement, and mitral valve repair. An LVAD and ICD are in place. There is no edema, consolidation, effusion, or pneumothorax. IMPRESSION: No evidence of active disease.  Stable from prior. Electronically Signed   By: Monte Fantasia M.D.   On: 03/09/2018 12:09   Dg Chest Port 1 View  Result Date: 02/28/2018 CLINICAL DATA:  Patient reports sudden onset of SOB this afternoon while lying in his bed (nurse later told me related to post dialysis treatment). Pain in center of chest especially when coughing. Hx of AICD present, asthma, CHF, diabetic, LVAD present, permanent atrial fibrillation, pacemaker present. 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 Non smoker. EXAM: PORTABLE CHEST 1 VIEW COMPARISON:  02/23/2018 FINDINGS: Changes from cardiac surgery with placement of a left ventricular assist device as well as artificial valve placement, are stable. Cardiac silhouette is mildly enlarged. No mediastinal or hilar masses. No convincing adenopathy. Right anterior chest wall AICD is stable. Lungs are clear.  No convincing pleural effusion.  No pneumothorax. Skeletal structures are grossly intact. IMPRESSION: 1. No acute  cardiopulmonary disease. Stable appearance  from the prior study. Electronically Signed   By: Lajean Manes M.D.   On: 02/28/2018 19:57    Labs:  CBC: Recent Labs    03/21/18 0815 03/22/18 0608 03/22/18 1206 03/23/18 0108  WBC 8.8 6.8 9.6 8.6  HGB 7.5* 5.5* 8.0* 8.1*  HCT 23.7* 17.2* 24.6* 25.0*  PLT 148* 144* 162 155    COAGS: Recent Labs    07/02/17 0231  03/20/18 2053 03/21/18 0250 03/22/18 0608 03/23/18 0108  INR  --    < > 1.12 1.21 1.16 1.17  APTT 181*  --   --   --   --   --    < > = values in this interval not displayed.    BMP: Recent Labs    03/21/18 0250 03/21/18 0815 03/22/18 0608 03/23/18 0108  NA 138 135 134* 135  K 4.1 4.3 4.1 3.6  CL 102 100* 100* 98*  CO2 29 27 25 30   GLUCOSE 125* 75 166* 93  BUN 25* 28* 36* 20  CALCIUM 7.6* 7.6* 7.2* 7.3*  CREATININE 4.48* 4.73* 5.46* 3.67*  GFRNONAA 12* 11* 9* 15*  GFRAA 14* 13* 11* 18*    LIVER FUNCTION TESTS: Recent Labs    07/31/17 0910 09/08/17 1331 11/18/17 1137  12/11/17 1145  03/04/18 0203 03/05/18 0234 03/21/18 0815 03/22/18 0608  BILITOT 1.0 1.0 1.0  --  1.2  --   --   --   --   --   AST 30 41 31  --  29  --   --   --   --   --   ALT 19 31 20   --  20  --   --   --   --   --   ALKPHOS 92 119 81  --  82  --   --   --   --   --   PROT 7.4 7.4 7.5  --  6.9  --   --   --   --   --   ALBUMIN 3.7 3.9 3.8   < > 3.5   < > 3.2* 3.3* 2.4* 2.1*   < > = values in this interval not displayed.    TUMOR MARKERS: No results for input(s): AFPTM, CEA, CA199, CHROMGRNA in the last 8760 hours.  Assessment and Plan: GI Bleed Patient with persistent GI bleeding- having BRBPR as well as some melena.  Recently transferred to [facility] but ultimately no bleeding source has been identified or treated.  He continues to require frequent blood transfusions.  Per chart, patient has refused surgery.  IR consulted for possible angiogram and embolization.   Patient has not had BRB today.  Discussed proceeding with  CTA to assess for current bleed and localize.   Patient would like to proceed. Discussed the possibility that this may not show active bleeding although we know he intermittently and recurrently bleeds.  If positive, will discuss proceeding with angiogram with patient and Dr. Barbie Banner.  Made NPO.  CTA ordered.  Will follow for results.   Thank you for this interesting consult.  I greatly enjoyed meeting Trevor [name] and look forward to participating in their care.  A copy of this report was sent to the requesting provider on this date.  Electronically Signed: Docia Barrier, PA 03/23/2018, 11:04 AM   I spent a total of 40 Minutes    in face to face in clinical consultation, greater than 50% of which was counseling/coordinating care for GI  bleed.

## 2018-03-23 NOTE — Progress Notes (Addendum)
Patient ID: Trevor [name], male   DOB: 1946/11/01, 72 y.o.   MRN: [id number]   Advanced Heart Failure VAD Team Note  PCP-Cardiologist: No primary care provider on file.   Subjective:    Still with maroon stool.  Unfortunately never got dialyzed yesterday so was never given his 2 units PRBCs and hgb down to 5.5 this morning.  He feels awful with painful leg swelling.  He has started HD this morning and has 1 unit PRBCs in, 2nd running.  Seen by general surgery yesterday, high risk for any surgical intervention would have to send back to [facility].     He is off warfarin for now, INR 1.1.   GI events  2018 colonoscopy. Blood throughout colon, but not in TI. Oozing tiny AVMS at hepatic flexure, treated with APC, endoclips. Three small small polyps not removed. Left colon tics.  02/27/18 EGD. Normal, no bleeding.  02/27/18 Colonoscopy. Blood in TI, sugg of SB source. Blood throughout colon, no source found. Descending and sigmoid tics. Multiple small polyps of ascending to sigmoid, not removed.  02/28/18 Capsule endo. Fresh blood at ileum, starting at 6 hours 58 mins. Blood obscured identification of lesion. Blood throughout colon. Per Dr Marin Shutter "Ileum (where bleeding) not reachable locally, and likely not reachable by anterograde double balloon enteroscopy at [facility]".Transfused 5 U PRBCs during admission.  At discharge continued on monthly octreotide and started Doxycycline 100 bid to try to limit AVM bleeding (and for URI sx)with plannedtransition to danazol. Goal INR decreased to 1.8 - 2.3  Admitted to 03/09/2018 with recurrent GI bleed. Coumadin was stopped. He received 3UPRBCs. Transferred to [facility] for deep entersospcopy on 03/10/2018.  Hospital course at [facility] included double retro ballon scope and he was sent for  IR embolization x 3 but he had no evidence of bleeding. Received 12 UPRBCs. He remained off anticoagulants.   Received 2UPRBCs 5/19. Over night having maroon stools.     Denies SOB.   LVAD INTERROGATION:  HeartMate 2 LVAD:   Flow 5.3 liters/min, speed 9200, power 5.6, PI 4.9. >30 PI events  Objective:    Vital Signs:   Temp:  [97.5 F (36.4 C)-98.6 F (37 C)] 98.2 F (36.8 C) (05/20 0700) Pulse Rate:  [55-94] 55 (05/20 0700) Resp:  [18-22] 18 (05/20 0332) BP: (63-97)/(29-76) 92/76 (05/20 0700) SpO2:  [73 %-100 %] 97 % (05/20 0332) Weight:  [192 lb 3.9 oz (87.2 kg)-194 lb 3.6 oz (88.1 kg)] 192 lb 3.9 oz (87.2 kg) (05/20 0332) Last BM Date: 03/23/18 Mean arterial Pressure 80s  Intake/Output:   Intake/Output Summary (Last 24 hours) at 03/23/2018 0813 Last data filed at 03/22/2018 2209 Gross per 24 hour  Intake 1740 ml  Output 2950 ml  Net -1210 ml     Physical Exam   Physical Exam: GENERAL: No acute distress. HEENT: normal  NECK: Supple, JVP ~10 .  2+ bilaterally, no bruits.  No lymphadenopathy or thyromegaly appreciated.   CARDIAC:  Mechanical heart sounds with LVAD hum present.  LUNGS:  Clear to auscultation bilaterally.  ABDOMEN:  Soft, round, nontender, positive bowel sounds x4.     LVAD exit site:  Dressing dry and intact.  No erythema or drainage.  Stabilization device present and accurately applied.  EXTREMITIES:  Warm and dry, no cyanosis, clubbing, rash or edema  NEUROLOGIC:  Alert and oriented x 4.  Gait steady.  No aphasia.  No dysarthria.  Affect pleasant.        Telemetry  A fib  70s personally reviewed.    Labs   Basic Metabolic Panel: Recent Labs  Lab 03/21/18 0250 03/21/18 0815 03/22/18 0608 03/23/18 0108  NA 138 135 134* 135  K 4.1 4.3 4.1 3.6  CL 102 100* 100* 98*  CO2 29 27 25 30   GLUCOSE 125* 75 166* 93  BUN 25* 28* 36* 20  CREATININE 4.48* 4.73* 5.46* 3.67*  CALCIUM 7.6* 7.6* 7.2* 7.3*  PHOS  --  2.7 2.3*  --     Liver Function Tests: Recent Labs  Lab 03/21/18 0815 03/22/18 0608  ALBUMIN 2.4* 2.1*   No results for input(s): LIPASE, AMYLASE in the last 168 hours. No results for input(s):  AMMONIA in the last 168 hours.  CBC: Recent Labs  Lab 03/21/18 0250 03/21/18 0815 03/22/18 0608 03/22/18 1206 03/23/18 0108  WBC 7.2 8.8 6.8 9.6 8.6  NEUTROABS  --   --   --  7.7  --   HGB 7.2* 7.5* 5.5* 8.0* 8.1*  HCT 22.9* 23.7* 17.2* 24.6* 25.0*  MCV 90.2 89.4 89.6 86.9 88.0  PLT 132* 148* 144* 162 155    INR: Recent Labs  Lab 03/20/18 2053 03/21/18 0250 03/22/18 0608 03/23/18 0108  INR 1.12 1.21 1.16 1.17    Other results:  EKG:    Imaging   No results found.   Medications:     Scheduled Medications: . atorvastatin  40 mg Oral q1800  . budesonide  0.25 mg Nebulization BID  . busPIRone  5 mg Oral BID  . calcitRIOL  0.5 mcg Oral Q M,W,F-HD  . danazol  100 mg Oral BID  . darbepoetin (ARANESP) injection - DIALYSIS  100 mcg Intravenous Q Mon-HD  . gabapentin  300 mg Oral BID  . insulin aspart  0-15 Units Subcutaneous TID WC  . insulin glargine  15 Units Subcutaneous QHS  . levothyroxine  25 mcg Oral QAC breakfast  . magnesium oxide  400 mg Oral Daily  . multivitamin  1 tablet Oral QHS  . octreotide  50 mcg Subcutaneous TID  . pantoprazole  40 mg Oral Daily  . sevelamer carbonate  2,400 mg Oral TID WC  . sildenafil  40 mg Oral TID    Infusions: . sodium chloride    . sodium chloride    . sodium chloride      PRN Medications: acetaminophen, albuterol, ondansetron (ZOFRAN) IV, sevelamer carbonate, traMADol, traZODone   Assessment/Plan:    1. GI Bleed/Symptomatic Anemia: GI AVMs. Double balloon retro endoscopy at [facility], unable to identify bleeding lesion.  He had failed attempted IR embolization x 3 while at [facility]. 12 units PRBCs at [facility]. He had 3 units PRBCs here so far. Still with rectal bleeding, had more overnight.  - Received 2U PRBCs 5/19. 5.5>8.1 Give 1UPRBCs.    - INR 1.2. - LDH 174>223. Baseline LDH 200s.  - He is not interested in surgery at this time, would have to be done at [facility] if this changes (seen by general surgery here yesterday).   -Start octreotide drip and continue danazol 100 mg bid.  - Will consult IR here, would attempt embolization again.  2. Chronic Systolic Heart Failure/RV Failure: HMIII LVAD placed 2017.  VAD parameters stable this morning. - LDH stable.   - Volume managed per HD - No bb with RV failure. Continue sildenafil 40 mg tid.  3. ESRD- M/W/F/Sat. Plan for HD today.  4. Chronic A fib: Off coumadin with GI bleed.  5. HTN: 70-80s  6. Squamous Cell  CA: s/p excision, margins at neck site not clear but has not wanted radiation.  7. DMII - Continue home regimen.  8. H/O CAD CABG 9. H/O TAVR: Valve stable on last echo.   I reviewed the LVAD parameters from today, and compared the results to the patient's prior recorded data.  No programming changes were made.  The LVAD is functioning within specified parameters.  The patient performs LVAD self-test daily.  LVAD interrogation was negative for any significant power changes, alarms or PI events/speed drops.  LVAD equipment check completed and is in good working order.  Back-up equipment present.   LVAD education done on emergency procedures and precautions and reviewed exit site care.  Length of Stay: 3  Darrick Grinder, NP 03/23/2018, 8:13 AM  VAD Team --- VAD ISSUES ONLY--- Pager [phone] (7am - 7am)  Advanced Heart Failure Team  Pager [phone] (M-F; 7a - 4p)  Please contact [facility] for night-coverage after hours (4p -7a ) and weekends on [email or web address]  Patient seen with NP, agree with the above note.  Mr Friesen continues to have GI bleeding with maroon stools.  He had 2 units PRBCs with HD yesterday, hgb up to 8.1 this morning.  - Will transfuse another unit with HD today.  - Change to octreotide gtt (had been getting ).  - Starting danazol. - We do not have many options here, I do not think we can do anything endoscopically (have tried here and at [facility]).  Given ongoing active bleeding, I would like our IR dept to see him and re-attempt  embolization.  This failed at [facility] last week.   He will have HD today.    We have him off all anticoagulation with active bleeding.  There is certainly increased risk for pump thrombosis but we really have no other option at this point.   Loralie Champagne 03/23/2018 8:33 AM

## 2018-03-23 NOTE — Progress Notes (Signed)
LVAD Coordinator called about pt's bp 66/49 (57) doppler 68. PI 2.7 pt feeling very tired.  Had large bm bloody stool. Will do stat CBC to check HGB.  If critical or below 8 will call Coordinator back  Will continue to monitor. Saunders Revel T

## 2018-03-23 NOTE — Evaluation (Signed)
Physical Therapy Evaluation Patient Details Name: Trevor [name] MRN: [id number] DOB: [birth year] Today's Date: 03/23/2018   History of Present Illness  Trevor [name] is a 72 y.o. male with a history of CAD s/p CABG x 4 2010, OSA, AS with TAVR 2015, , ESRD on HD, DM2, chronic atrial fibrillation, asthma, GI bleed, and ischemic cardiomyopathy with Medtronic ICD.  S/P HMII LVAD for Destination therapy on 12/14/2015. admitted for symptomatic anemia. Pt with con't GI bleed. Went to [facility] for a week and was unsuccessful with treatment to stop the bleed.  Clinical Impression  Pt was indep up until about a week ago. Pt now with bilat LE edema, generalized weakness, and decreased activity tolerance. Pt frustrated both [name] and [facility] have been unable to "fix" his GI bleed. Pt reports he is trying to stay positive but it's hard.  Acute PT to con't to follow.    Follow Up Recommendations Home health PT;Supervision/Assistance - 24 hour    Equipment Recommendations  3in1 (PT)    Recommendations for Other Services       Precautions / Restrictions Precautions Precautions: Other (comment) Precaution Comments: LVAD Restrictions Weight Bearing Restrictions: No      Mobility  Bed Mobility               General bed mobility comments: pt up in chair upon PT arrival  Transfers Overall transfer level: Modified independent Equipment used: None             General transfer comment: increased time, guarded, cautious, no difficulty, safe technique  Ambulation/Gait Ambulation/Gait assistance: Min guard Ambulation Distance (Feet): 150 Feet Assistive device: Rolling walker (2 wheeled) Gait Pattern/deviations: Step-through pattern;Decreased stride length Gait velocity: slow Gait velocity interpretation: <1.31 ft/sec, indicative of household ambulator General Gait Details: pt with +SOB, decreased tolerance, increased bilat LE pain. no episode of LOB  Stairs             Wheelchair Mobility    Modified Rankin (Stroke Patients Only)       Balance Overall balance assessment: Modified Independent(pt able to sit EOB and transfer LVAD to batteries/wall unit)                                           Pertinent Vitals/Pain Pain Assessment: 0-10 Pain Score: 8  Pain Location: bilat LE, especially the R Pain Descriptors / Indicators: Burning Pain Intervention(s): Limited activity within patient's tolerance    Home Living Family/patient expects to be discharged to:: Private residence Living Arrangements: Spouse/significant other Available Help at Discharge: Family;Available 24 hours/day Type of Home: (condo) Home Access: Level entry     Home Layout: One level Home Equipment: Shower seat      Prior Function Level of Independence: Independent         Comments: pt indep with driving, ADLs, and mobility. no AD needed     Hand Dominance   Dominant Hand: Right    Extremity/Trunk Assessment   Upper Extremity Assessment Upper Extremity Assessment: Generalized weakness    Lower Extremity Assessment Lower Extremity Assessment: Generalized weakness(bilat edema/swelling)    Cervical / Trunk Assessment Cervical / Trunk Assessment: Normal  Communication   Communication: No difficulties  Cognition Arousal/Alertness: Awake/alert Behavior During Therapy: WFL for tasks assessed/performed(depressed spirits from failed treatment) Overall Cognitive Status: Within Functional Limits for tasks assessed  General Comments: pt expresses his frustration but reports he's continuing to try to stay posisitve      General Comments General comments (skin integrity, edema, etc.): pt with bilat LE edema    Exercises General Exercises - Lower Extremity Ankle Circles/Pumps: AROM;Both;10 reps;Seated(with LEs elevated. Pt given slow stretch and fast pumping) Quad Sets: AROM;Both;10  reps;Seated(with LEs elevated) Gluteal Sets: AROM;Both;10 reps;Seated   Assessment/Plan    PT Assessment Patient needs continued PT services  PT Problem List Decreased strength;Decreased range of motion;Decreased activity tolerance;Decreased balance;Decreased mobility;Decreased knowledge of use of DME;Pain;Cardiopulmonary status limiting activity       PT Treatment Interventions DME instruction;Gait training;Stair training;Functional mobility training;Therapeutic activities;Therapeutic exercise;Balance training;Neuromuscular re-education    PT Goals (Current goals can be found in the Care Plan section)  Acute Rehab PT Goals Patient Stated Goal: stop the bleeding PT Goal Formulation: With patient Time For Goal Achievement: 03/30/18 Potential to Achieve Goals: Good    Frequency Min 3X/week   Barriers to discharge        Co-evaluation               AM-PAC PT "6 Clicks" Daily Activity  Outcome Measure Difficulty turning over in bed (including adjusting bedclothes, sheets and blankets)?: None Difficulty moving from lying on back to sitting on the side of the bed? : None Difficulty sitting down on and standing up from a chair with arms (e.g., wheelchair, bedside commode, etc,.)?: None Help needed moving to and from a bed to chair (including a wheelchair)?: A Little Help needed walking in hospital room?: A Little Help needed climbing 3-5 steps with a railing? : A Little 6 Click Score: 21    End of Session Equipment Utilized During Treatment: Gait belt Activity Tolerance: Patient limited by fatigue Patient left: in chair;with call bell/phone within reach Nurse Communication: Mobility status PT Visit Diagnosis: Muscle weakness (generalized) (M62.81)    Time: 7846-9629 PT Time Calculation (min) (ACUTE ONLY): 18 min   Charges:   PT Evaluation $PT Eval Low Complexity: 1 Low     PT G CodesKittie Plater, PT, DPT Pager #: [phone] Office #:  [phone]   [place] 03/23/2018, 9:57 AM

## 2018-03-24 DIAGNOSIS — D62 Acute posthemorrhagic anemia: Secondary | ICD-10-CM

## 2018-03-24 DIAGNOSIS — Z7901 Long term (current) use of anticoagulants: Secondary | ICD-10-CM

## 2018-03-24 LAB — BPAM RBC
Blood Product Expiration Date: 201905242359
Blood Product Expiration Date: 201906102359
Blood Product Expiration Date: 201906112359
Blood Product Expiration Date: 201906132359
ISSUE DATE / TIME: 201905190700
ISSUE DATE / TIME: 201905190700
ISSUE DATE / TIME: 201905191520
ISSUE DATE / TIME: 201905201022
UNIT TYPE AND RH: 600
Unit Type and Rh: 600
Unit Type and Rh: 600
Unit Type and Rh: 9500

## 2018-03-24 LAB — TYPE AND SCREEN
ABO/RH(D): A NEG
Antibody Screen: NEGATIVE
UNIT DIVISION: 0
UNIT DIVISION: 0
UNIT DIVISION: 0
UNIT DIVISION: 0

## 2018-03-24 LAB — BASIC METABOLIC PANEL
Anion gap: 7 (ref 5–15)
BUN: 12 mg/dL (ref 6–20)
CHLORIDE: 99 mmol/L — AB (ref 101–111)
CO2: 30 mmol/L (ref 22–32)
Calcium: 7.2 mg/dL — ABNORMAL LOW (ref 8.9–10.3)
Creatinine, Ser: 2.48 mg/dL — ABNORMAL HIGH (ref 0.61–1.24)
GFR calc non Af Amer: 24 mL/min — ABNORMAL LOW (ref >60)
GFR, EST AFRICAN AMERICAN: 28 mL/min — AB (ref >60)
Glucose, Bld: 145 mg/dL — ABNORMAL HIGH (ref 65–99)
POTASSIUM: 3.6 mmol/L (ref 3.5–5.1)
SODIUM: 136 mmol/L (ref 135–145)

## 2018-03-24 LAB — CBC
HCT: 24 % — ABNORMAL LOW (ref 39.0–52.0)
HEMOGLOBIN: 7.9 g/dL — AB (ref 13.0–17.0)
MCH: 28.7 pg (ref 26.0–34.0)
MCHC: 32.9 g/dL (ref 30.0–36.0)
MCV: 87.3 fL (ref 78.0–100.0)
Platelets: 155 10*3/uL (ref 150–400)
RBC: 2.75 MIL/uL — ABNORMAL LOW (ref 4.22–5.81)
RDW: 17.6 % — ABNORMAL HIGH (ref 11.5–15.5)
WBC: 9.7 10*3/uL (ref 4.0–10.5)

## 2018-03-24 LAB — PREPARE RBC (CROSSMATCH)

## 2018-03-24 LAB — PROTIME-INR
INR: 1.1
PROTHROMBIN TIME: 14.1 s (ref 11.4–15.2)

## 2018-03-24 LAB — GLUCOSE, CAPILLARY
GLUCOSE-CAPILLARY: 50 mg/dL — AB (ref 65–99)
GLUCOSE-CAPILLARY: 157 mg/dL — AB (ref 65–99)
Glucose-Capillary: 84 mg/dL (ref 65–99)
Glucose-Capillary: 130 mg/dL — ABNORMAL HIGH (ref 65–99)
Glucose-Capillary: 68 mg/dL (ref 65–99)

## 2018-03-24 LAB — MAGNESIUM: Magnesium: 1.5 mg/dL — ABNORMAL LOW (ref 1.7–2.4)

## 2018-03-24 LAB — HEMOGLOBIN AND HEMATOCRIT, BLOOD
HEMATOCRIT: 22.9 % — AB (ref 39.0–52.0)
HEMOGLOBIN: 7.4 g/dL — AB (ref 13.0–17.0)

## 2018-03-24 LAB — LACTATE DEHYDROGENASE: LDH: 224 U/L — ABNORMAL HIGH (ref 98–192)

## 2018-03-24 MED ORDER — INSULIN GLARGINE 100 UNIT/ML ~~LOC~~ SOLN
12.0000 [IU] | Freq: Every day | SUBCUTANEOUS | Status: DC
  Filled 2018-03-24: qty 0.12

## 2018-03-24 MED ORDER — MOMETASONE FURO-FORMOTEROL FUM 200-5 MCG/ACT IN AERO
2.0000 | INHALATION_SPRAY | Freq: Two times a day (BID) | RESPIRATORY_TRACT | Status: DC
  Administered 2018-03-24 – 2018-03-31 (×11): 2 via RESPIRATORY_TRACT
  Filled 2018-03-24 (×3): qty 8.8

## 2018-03-24 MED ORDER — GLUCOSE 40 % PO GEL
ORAL | Status: AC
  Administered 2018-03-25
  Filled 2018-03-24: qty 1

## 2018-03-24 MED ORDER — SEVELAMER CARBONATE 800 MG PO TABS
1600.0000 mg | ORAL_TABLET | Freq: Three times a day (TID) | ORAL | Status: DC
  Administered 2018-03-24 – 2018-03-26 (×5): 1600 mg via ORAL
  Filled 2018-03-24 (×6): qty 2

## 2018-03-24 MED ORDER — SODIUM CHLORIDE 0.9 % IV SOLN
Freq: Once | INTRAVENOUS | Status: AC
  Administered 2018-03-24: 12:00:00 via INTRAVENOUS

## 2018-03-24 MED ORDER — MAGNESIUM SULFATE 2 GM/50ML IV SOLN
2.0000 g | Freq: Once | INTRAVENOUS | Status: AC
  Administered 2018-03-24: 2 g via INTRAVENOUS
  Filled 2018-03-24: qty 50

## 2018-03-24 MED ORDER — OXYCODONE-ACETAMINOPHEN 5-325 MG PO TABS
1.0000 | ORAL_TABLET | Freq: Four times a day (QID) | ORAL | Status: DC | PRN
Start: 2018-03-24 — End: 2018-04-01
  Administered 2018-03-24 – 2018-04-01 (×13): 2 via ORAL
  Filled 2018-03-24 (×13): qty 2

## 2018-03-24 MED ORDER — SEVELAMER CARBONATE 800 MG PO TABS: 800.0000 mg | ORAL_TABLET | Freq: Two times a day (BID) | ORAL | Status: DC | PRN

## 2018-03-24 MED ORDER — POLYETHYLENE GLYCOL 3350 17 GM/SCOOP PO POWD
0.5000 | Freq: Once | ORAL | Status: AC
  Administered 2018-03-24: 127.5 g via ORAL
  Filled 2018-03-24: qty 255

## 2018-03-24 NOTE — Progress Notes (Signed)
Hypoglycemic Event  CBG: 50  Treatment: 2 OJ  Symptoms: none  Follow-up CBG: Time: 0829 CBG Result: 84  Possible Reasons for Event: 15u Lantus @ night  Comments/MD notified:Yes- MD will decrease Lantus dose    Sherral Hammers I Avrian Delfavero

## 2018-03-24 NOTE — Progress Notes (Signed)
LVAD Coordinator Rounding Note:  Re-admitted from [facility] on 03/20/18 with continuing GI bleed.    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 Darcey Nora under Destination Therapy criteria due to age excluding heart transplantation.  Pt sitting in chair with feet propped up. C/O overall swelling, severe left knee pain when he attempted to get up this am. States he has hx of "trick knee" in the past and feels PT exercises he has been attempting may have aggravated the knee. [name] updated.  Pt says he had a total of three stools overnight, reports they are still bloody, but becoming soft formed.   Vital signs: Temp:  98.0 HR: 80 Doppler Pressure: 80 - correlates with modified systolic Automatic BP:  57/84 (70) O2 Sat: 98% RA Wt: 197>194>192>190 lbs  LVAD interrogation reveals:  Speed:  9200 Flow:  6.2 Power: 6.0w PI: 3.9 Alarms: none Events:  36 PI events on 03/23/18 Fixed speed: 9200 Low speed limit: 8600  Primary Controller: Replace back up battery in 7 months Back up controller: Replace back up battery in 62month   Drive Line: Right abdominal Sorbaview dressing with Tegaderm covering intact. Anchor intact and accurately applied. Pt has weekly dressing changes using weekly kit; rinse with sterile saline after Chloraprep applicator, no skin prep or Bio patch used due to skin irritation.   Weekly dressing changes per bedside RN - next dressing change due 03/29/18.  Labs:  LDH trend: 224>191>174>223>224  INR trend: coumadin stopped  Hgb trend: 9.5>8.2>7.2>5.5>8.0>8.1>7.9  Anticoagulation Plan: - INR Goal: coumadin stopped - ASA Dose: none due to hx of GI bleed - OP monthly Octreotide for hx of GI bleed  Blood Products:  - [facility] units PC's [facility]- 03/22[facility]> 3 units PC's - 03/23/18> 1 unit PC - 03/24/18> 1 unit PC  Device: - Medtronic single lead -Therapies: on 231 bpm  Arrythmias: chronic afib  Renal:  [facility] Adverse Events on VAD: - 02/2017> GIB- AVM clipped x2 - 07/2017> renal failure- HD started - 11/2017>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 Margins not clear at neck. - 02/23/18> hospitalization for GI bleed. Colon/EGD 02/27/18 with normal EGD. Colonoscopy identified active bleeding in ileum; source not found. Capsule Endoscopy 02/27/18: Showed active bleeding in the ileum. Received 5 units blood with histamine reaction; will give benadryl as OP. - [facility]10[facility]18[facility] to IR emolization x 3 with no evidence of bleeding. Received 12 units PCs, remained off anticoagulants. [facility]  Plan/Recommendations: 1.  Call VAD pager if any questions re: VAD equipment or drive line site issues.   MZada GirtRN, VAD Coordinator 24/7 VAD pager: [phone]

## 2018-03-24 NOTE — Consult Note (Addendum)
[facility] Consult: 10:08 AM 03/24/2018  LOS: 4 days    Referring Provider: Dr Haroldine Laws  Primary Care Physician:  Janith Lima, MD Primary Gastroenterologist:  Dr. Ardis Hughs    Reason for Consultation:  GIB in LVAD pt   HPI: Trevor [name] is a 72 y.o. male.  PMH COPD.  ESRD on HD. Cardiomyopathy, CHF.  S/p LVAD 01/01/18.  S/p TAVR.  Chronic A fib.  On chronic Coumadin.  Anemia.  GIBs.   02/2017 colonoscopy.  Blood throughout colon, but not in TI.  Oozing tiny AVMS at hepatic flexure, treated with APC, endoclips.  Three small small polyps not removed. Left colon tics.    Seen 02/26/18 by GI Dr Hilarie Fredrickson for black stools, acute on chronic anemia. 02/27/18 EGD.  Normal, no bleeding.     02/27/18 Colonoscopy.    Blood in TI, sugg of SB source.  Blood throughout colon, no source found.  Descending and sigmoid tics.  Multiple small polyps of ascending to sigmoid, not removed.   02/28/18 Capsule endo.  Fresh blood at ileum, starting at 6 hours 58 mins.  Blood obscured identification of lesion.  Blood throughout colon. Per Dr Marin Shutter "Ileum (where bleeding) not reachable locally, and likely not reachable by anterograde double balloon enteroscopy at [facility]". Transfused 5 U PRBCs during that admission.   On monthly octreotide and Doxycycline 100 bid to try to limit AVM bleeding with planned transition to danazol.  Goal INR 1.8 - 2.3.   Last inpt GI consult 5/6 for acute on chronic anemia and bloody stool on top of chronic dark stool.  Received 2 U PRBCs.  He transferred to [facility] for advanced endoscopy.  Admission there from 03/10/2018 -03/20/2018. Underwent retrograde, dbl balloon endoscopy which showed active bleeding but unable to find the source.   Provocative arteriogram 5/13 using TPA revealed no bleeding source.   CT 5/13  revealed small bowel bleed in the RLQ with diffuse hyperdensity in the colon.   Repeat IR study, provocative angiogram 5/15 was unable to locate the bleeding and it was felt to high risk to empirically embolize the area since it was close to the SMA and could cause an ischemic event.   General surgery was brought in who felt total colectomy was needed but would be very high risk perfect seizure and the patient refused. LVAD pump speed was decreased from 9600 > 9200 in hopes of decreasing bleeding risk. In all he was transfused with at least 12 U of PRBCs during his stay.    Patient ultimately discharged and returned to [facility] hospital 5/17 on danazol, monthly octreotide.  Quoting the discharge note, Dr. Judyann Munson stated "This remains a very challenging and difficult situation. Patient does not desire surgery, which would be very high risk and associated with high morbidity/mortality. We continue to work with multiple teams to determine next best action plan. We will try a repeat provocative angiography with hopes of identifying active hemorrhage source and pursuing embolization. GI does not feel repeat endoscopic evaluation will be high yield. I also spoke to  the patient's VAD team at [facility] today for update and to discuss the possibility of transfer back to their facility for continued medical management/transfusions in absence of our team's pursuit of a surgical solution. He is presently not on anticoagulation or antiplatelet therapy, and continues to bleed despite conservative management. They have agreed to receive him back if no further interventions can be performed here and according to patient's desire."   Since return he's had ongoing bleeding.  IR CT angiogram 5/20: no active bleeding.  Surgery here, Dr Donne Hazel, is recommending repeat capsule endoscopy.   Has gotten 2 U PRBCs since 5/17.  Hgb 7.9, inr 1.1 this AM.  Remains off blood thinners and is getting Octreotide gtt.     Patient's  appetite is good.  He is not enamored of the food that he is been getting here in the hospital but when he likes it, he eats well.  Still having bloody and burgundy stools.  No abdominal pain.  Complaining of pain in his right knee that started this morning.   Past Medical History:  Diagnosis Date  . AICD (automatic cardioverter/defibrillator) present   . Asthma   . AVM (arteriovenous malformation) of colon 07/07/2017  . CHF (congestive heart failure) ([place])   . Diabetes ([place])   . ESRF (end stage renal failure) ([place]) 07/07/2017   pt receiving hemodialysis Monday- Wednesday-friday, sometimes Saturday  . Kidney disease   . LVAD (left ventricular assist device) present ([place]) 12/2016  . Permanent atrial fibrillation ([place]) 07/07/2017  . Presence of permanent cardiac pacemaker    AICD  . Sleep apnea     Past Surgical History:  Procedure Laterality Date  . AORTIC VALVE REPLACEMENT  2015   [place]  . APPLICATION OF A-CELL OF EXTREMITY Left 11/20/2017   Procedure: APPLICATION OF A-CELL;  Surgeon: Wallace Going, DO;  Location: [place];  Service: Plastics;  Laterality: Left;  . AV FISTULA PLACEMENT Left 06/30/2017   Procedure: CREATION of LEFT ARM Brachiocephalic Fistula;  Surgeon: Conrad Palacios, MD;  Location: [place];  Service: Vascular;  Laterality: Left;  . CARDIAC CATHETERIZATION N/A 12/02/2016   Procedure: Right Heart Cath;  Surgeon: Larey Dresser, MD;  Location: [place] CV LAB;  Service: Cardiovascular;  Laterality: N/A;  . COLONOSCOPY N/A 02/14/2017   Procedure: COLONOSCOPY;  Surgeon: Milus Banister, MD;  Location: [place];  Service: Endoscopy;  Laterality: N/A;  . COLONOSCOPY WITH PROPOFOL N/A 02/27/2018   Procedure: COLONOSCOPY WITH PROPOFOL;  Surgeon: Jerene Bears, MD;  Location: [place];  Service: Gastroenterology;  Laterality: N/A;  . CORONARY ANGIOPLASTY WITH STENT PLACEMENT  2013   in [place]  . CORONARY ARTERY BYPASS GRAFT  2010   in [place]  . DIALYSIS/PERMA  CATHETER INSERTION  06/30/2017   Procedure: INSERTION Dialysis Catheter;  Surgeon: Conrad Chimayo, MD;  Location: [facility] OR;  Service: Vascular;;  . ESOPHAGOGASTRODUODENOSCOPY (EGD) WITH PROPOFOL N/A 02/27/2018   Procedure: ESOPHAGOGASTRODUODENOSCOPY (EGD) WITH PROPOFOL;  Surgeon: Jerene Bears, MD;  Location: [facility] ENDOSCOPY;  Service: Gastroenterology;  Laterality: N/A;  . GIVENS CAPSULE STUDY N/A 02/27/2018   Procedure: GIVENS CAPSULE STUDY;  Surgeon: Jerene Bears, MD;  Location: [place];  Service: Gastroenterology;  Laterality: N/A;  . IABP INSERTION N/A 12/11/2016   Procedure: IABP Insertion;  Surgeon: Larey Dresser, MD;  Location: [place] CV LAB;  Service: Cardiovascular;  Laterality: N/A;  . INSERTION OF IMPLANTABLE LEFT VENTRICULAR ASSIST DEVICE N/A 12/13/2016   Procedure: INSERTION OF  IMPLANTABLE LEFT VENTRICULAR ASSIST DEVICE;  Surgeon: Ivin Poot, MD;  Location: [place];  Service: Open Heart Surgery;  Laterality: N/A;  HEARTMATE II  NITRIC OXIDE  . IR FLUORO GUIDE CV LINE LEFT  06/17/2017  . IR US GUIDE VASC ACCESS LEFT  06/17/2017  . MASS EXCISION Left 11/20/2017   Procedure: EXCISION OF LEFT NECK AND RIGHT LEG SKIN CANCER WITH A CELL PLACEMENT;  Surgeon: Wallace Going, DO;  Location: [place];  Service: Plastics;  Laterality: Left;  Marland Kitchen MASS EXCISION Left 11/26/2017   Procedure: EXPLORATION AND EXCISION OF LEFT NECK;  Surgeon: Wallace Going, DO;  Location: [place];  Service: Plastics;  Laterality: Left;  . RIGHT HEART CATH N/A 12/11/2016   Procedure: Right Heart Cath;  Surgeon: Larey Dresser, MD;  Location: [place] CV LAB;  Service: Cardiovascular;  Laterality: N/A;  . RIGHT HEART CATH N/A 03/27/2017   Procedure: Right Heart Cath;  Surgeon: Larey Dresser, MD;  Location: [place] CV LAB;  Service: Cardiovascular;  Laterality: N/A;  . RIGHT HEART CATH N/A 06/13/2017   Procedure: RIGHT HEART CATH;  Surgeon: Jolaine Artist, MD;  Location: [place] CV LAB;  Service:  Cardiovascular;  Laterality: N/A;  . TEE WITHOUT CARDIOVERSION N/A 12/13/2016   Procedure: TRANSESOPHAGEAL ECHOCARDIOGRAM (TEE);  Surgeon: Ivin Poot, MD;  Location: [place];  Service: Open Heart Surgery;  Laterality: N/A;  . TRICUSPID VALVE REPLACEMENT N/A 12/13/2016   Procedure: TRICUSPID VALVE REPAIR WITH EDWARDS MC 3 TRICUSPID ANNULOPLASTY RING MODEL 4900 SIZE T 28;  Surgeon: Ivin Poot, MD;  Location: [place];  Service: Open Heart Surgery;  Laterality: N/A;    Prior to Admission medications   Medication Sig Start Date End Date Taking? Authorizing Provider  acetaminophen (TYLENOL) 325 MG tablet Take 2 tablets (650 mg total) by mouth every 4 (four) hours as needed for headache or mild pain. 03/10/18  Yes Shirley Friar, PA-C  albuterol (PROVENTIL HFA;VENTOLIN HFA) 108 (90 Base) MCG/ACT inhaler Inhale 2 puffs into the lungs every 4 (four) hours as needed for wheezing or shortness of breath. 02/10/18  Yes Larey Dresser, MD  atorvastatin (LIPITOR) 40 MG tablet TAKE 1 TABLET(40 MG) BY MOUTH DAILY Patient taking differently: Take 40 mg by mouth daily at 6 PM.  09/02/17  Yes Larey Dresser, MD  atorvastatin (LIPITOR) 40 MG tablet Take 1 tablet (40 mg total) by mouth daily at 6 PM. 03/11/18  Yes Larey Dresser, MD  busPIRone (BUSPAR) 5 MG tablet Take 1 tablet (5 mg total) by mouth 2 (two) times daily. 01/22/18  Yes Bensimhon, Shaune Pascal, MD  calcitRIOL (ROCALTROL) 0.5 MCG capsule Take 1 capsule (0.5 mcg total) by mouth every Monday, Wednesday, and Friday with hemodialysis. 03/11/18  Yes Shirley Friar, PA-C  danazol (DANOCRINE) 100 MG capsule Take 1 capsule (100 mg total) by mouth 2 (two) times daily. 03/05/18  Yes Larey Dresser, MD  docusate sodium (COLACE) 100 MG capsule Take 1 capsule (100 mg total) by mouth 2 (two) times daily. Patient taking differently: Take 100 mg daily by mouth.  04/03/17  Yes Larey Dresser, MD  doxycycline (VIBRA-TABS) 100 MG tablet Take 1 tablet (100 mg  total) by mouth every 12 (twelve) hours. 03/05/18  Yes Clegg, Amy D, NP  epoetin alfa (EPOGEN) 4000 UNIT/ML injection Inject 3,600 Units into the vein See admin instructions. For dialysis 03/20/18  Yes [provider]  ferric citrate (AURYXIA) 1 GM 210  MG(Fe) tablet Take 420 mg by mouth 3 (three) times daily with meals.   Yes [provider]  fluticasone (FLOVENT HFA) 110 MCG/ACT inhaler Inhale 2 puffs into the lungs 2 (two) times daily. 02/10/18  Yes Larey Dresser, MD  gabapentin (NEURONTIN) 600 MG tablet Take 0.5 tablets (300 mg total) by mouth 2 (two) times daily. 07/23/17  Yes Larey Dresser, MD  insulin glargine (LANTUS) 100 unit/mL SOPN Inject 0.15 mLs (15 Units total) into the skin at bedtime. 03/05/18  Yes Clegg, Amy D, NP  levothyroxine (SYNTHROID, LEVOTHROID) 25 MCG tablet Take 1 tablet (25 mcg total) by mouth daily before breakfast. 10/02/17  Yes Larey Dresser, MD  magnesium oxide (MAG-OX) 400 MG tablet Take 400 mg by mouth daily.   Yes [provider]  multivitamin (RENA-VIT) TABS tablet Take 1 tablet by mouth at bedtime. 09/02/17  Yes Larey Dresser, MD  pantoprazole (PROTONIX) 40 MG tablet Take 1 tablet (40 mg total) by mouth daily. 08/11/17  Yes Clegg, Amy D, NP  sevelamer carbonate (RENVELA) 800 MG tablet Take 1,600-2,400 mg by mouth See admin instructions. Take 2,400 mg by mouth three times a day with meals and 1,600 mg two times a day with snacks 02/03/18  Yes [provider]  sildenafil (REVATIO) 20 MG tablet Take 2 tablets (40 mg total) 3 (three) times daily by mouth. Patient taking differently: Take 40 mg by mouth See admin instructions. Take two tablets (40 mg) by mouth three times a day- midday, afternoon, and bedtime 09/11/17  Yes Larey Dresser, MD  traMADol (ULTRAM) 50 MG tablet Take 1 tablet (50 mg total) by mouth every 12 (twelve) hours as needed. TAKE 1 TABLET BY MOUTH EVERY 6 HOURS AS NEEDED FOR MODERATE PAIN Patient taking differently:  Take 50 mg by mouth every 6 (six) hours as needed for moderate pain.  03/05/18  Yes Clegg, Amy D, NP  traZODone (DESYREL) 100 MG tablet TAKE 1 TABLET BY MOUTH EVERY NIGHT AT BEDTIME AS NEEDED FOR SLEEP Patient taking differently: TAKE 1 TABLET BY MOUTH EVERY NIGHT AT BEDTIME 02/16/18  Yes Larey Dresser, MD    Scheduled Meds: . atorvastatin  40 mg Oral q1800  . busPIRone  5 mg Oral BID  . calcitRIOL  0.5 mcg Oral Q M,W,F-HD  . danazol  100 mg Oral BID  . darbepoetin (ARANESP) injection - DIALYSIS  100 mcg Intravenous Q Mon-HD  . gabapentin  300 mg Oral BID  . insulin aspart  0-15 Units Subcutaneous TID WC  . insulin glargine  12 Units Subcutaneous QHS  . levothyroxine  25 mcg Oral QAC breakfast  . magnesium oxide  400 mg Oral Daily  . mometasone-formoterol  2 puff Inhalation BID  . multivitamin  1 tablet Oral QHS  . pantoprazole  40 mg Oral Daily  . sevelamer carbonate  1,600 mg Oral TID WC  . sildenafil  40 mg Oral TID   Infusions: . sodium chloride    . octreotide  (SANDOSTATIN)    IV infusion 50 mcg/hr (03/24/18 3235)   PRN Meds: acetaminophen, albuterol, ondansetron (ZOFRAN) IV, sevelamer carbonate, traMADol, traZODone   Allergies as of 03/19/2018  . (No Known Allergies)    Family History  Problem Relation Age of Onset  . Heart failure Father   . Heart attack Father   . Healthy Mother   . Diabetes Paternal Grandfather     Social History   Socioeconomic History  . Marital status: Widowed  Spouse name: Not on file  . Number of children: 0  . Years of education: 52  . Highest education level: Not on file  Occupational History  . Not on file  Social Needs  . Financial resource strain: Not on file  . Food insecurity:    Worry: Not on file    Inability: Not on file  . Transportation needs:    Medical: Not on file    Non-medical: Not on file  Tobacco Use  . Smoking status: Never Smoker  . Smokeless tobacco: Never Used  Substance and Sexual Activity  .  Alcohol use: No  . Drug use: No  . Sexual activity: Not Currently  Lifestyle  . Physical activity:    Days per week: Not on file    Minutes per session: Not on file  . Stress: Not on file  Relationships  . Social connections:    Talks on phone: Not on file    Gets together: Not on file    Attends religious service: Not on file    Active member of club or organization: Not on file    Attends meetings of clubs or organizations: Not on file    Relationship status: Not on file  . Intimate partner violence:    Fear of current or ex partner: Not on file    Emotionally abused: Not on file    Physically abused: Not on file    Forced sexual activity: Not on file  Other Topics Concern  . Not on file  Social History Narrative   Patient is a widow. Moved to [place] from [place], [place]. Currently lives with his sister Naeem Quillin.    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     REVIEW OF SYSTEMS: Constitutional: Feels kind of weak and tired but overall this it has improved ENT:  No nose bleeds Pulm: Denies shortness of breath or cough. CV:  No palpitations, no LE edema.  GU:  No hematuria, no frequency GI:  Per HPI Heme:  Per HPI   Transfusions:  Per HPI Neuro:  No headaches, no peripheral tingling or numbness Derm:  No itching, no rash or sores.  Endocrine:  No sweats or chills.  No polyuria or dysuria Immunization:  Not queried Travel:  None beyond local counties in last few months.    PHYSICAL EXAM: Vital signs in last 24 hours: Vitals:   03/24/18 0300 03/24/18 0757  BP: (!) 91/52 (!) 86/61  Pulse: 76 80  Resp: 16   Temp: 98 F (36.7 C)   SpO2: 97% 98%   Wt Readings from Last 3 Encounters:  03/24/18 190 lb 14.7 oz (86.6 kg)  03/10/18 177 lb 4 oz (80.4 kg)  03/05/18 184 lb (83.5 kg)    General: Patient looks well.  Comfortable.  Alert. Head: No facial asymmetry or swelling.  No signs of head trauma. Eyes: No scleral icterus.  No conjunctival  pallor.  EOMI. Ears: Not hard of hearing. Nose: No congestion or discharge. Mouth: Moist, clear, pink oropharynx.  Tongue midline. Neck: No JVD, no masses, no thyromegaly. Lungs: Clear bilaterally with overall diminished breath sounds.  No shortness of breath or cough. Heart: LVAD hum. Abdomen: Soft.  Driveline site nontender, benign.  No HSM, masses, bruits.  Active bowel sounds..   Rectal: Deferred Musc/Skeltl: No joint redness or swelling. Extremities: Mild, 1+, bilateral pedal edema. Neurologic: Oriented x3.  No tremors, no limb weakness. Skin: No telangiectasia, no rashes, no sores. Nodes: No  cervical adenopathy. Psych: Cooperative, calm, pleasant.  Intake/Output from previous day: 05/20 0701 - 05/21 0700 In: 1898.7 [P.O.:1060; I.V.:486.7; Blood:352] Out: 2217 [Stool:2] Intake/Output this shift: Total I/O In: 240 [P.O.:240] Out: -   LAB RESULTS: Recent Labs    03/22/18 1206 03/23/18 0108 03/24/18 0228  WBC 9.6 8.6 9.7  HGB 8.0* 8.1* 7.9*  HCT 24.6* 25.0* 24.0*  PLT 162 155 155   BMET Lab Results  Component Value Date   NA 136 03/24/2018   NA 135 03/23/2018   NA 134 (L) 03/22/2018   K 3.6 03/24/2018   K 3.6 03/23/2018   K 4.1 03/22/2018   CL 99 (L) 03/24/2018   CL 98 (L) 03/23/2018   CL 100 (L) 03/22/2018   CO2 30 03/24/2018   CO2 30 03/23/2018   CO2 25 03/22/2018   GLUCOSE 145 (H) 03/24/2018   GLUCOSE 93 03/23/2018   GLUCOSE 166 (H) 03/22/2018   BUN 12 03/24/2018   BUN 20 03/23/2018   BUN 36 (H) 03/22/2018   CREATININE 2.48 (H) 03/24/2018   CREATININE 3.67 (H) 03/23/2018   CREATININE 5.46 (H) 03/22/2018   CALCIUM 7.2 (L) 03/24/2018   CALCIUM 7.3 (L) 03/23/2018   CALCIUM 7.2 (L) 03/22/2018   LFT Recent Labs    03/22/18 0608  ALBUMIN 2.1*   PT/INR Lab Results  Component Value Date   INR 1.10 03/24/2018   INR 1.17 03/23/2018   INR 1.16 03/22/2018   Hepatitis Panel No results for input(s): HEPBSAG, HCVAB, HEPAIGM, HEPBIGM in the last 72  hours. C-Diff No components found for: CDIFF Lipase  No results found for: LIPASE  Drugs of Abuse     Component Value Date/Time   LABOPIA NONE DETECTED 12/03/2016 1720   COCAINSCRNUR NONE DETECTED 12/03/2016 1720   LABBENZ NONE DETECTED 12/03/2016 1720   AMPHETMU NONE DETECTED 12/03/2016 1720   THCU NONE DETECTED 12/03/2016 1720   LABBARB NONE DETECTED 12/03/2016 1720     RADIOLOGY STUDIES: Ct Angio Abd/pel W/ And/or W/o  Result Date: 03/23/2018 CLINICAL DATA:  72 year old male with end-stage renal failure on hemodialysis and chronic CHF with a left ventricular assist device in place. 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 Evaluate for treatable gastric varices. EXAM: CT ANGIOGRAPHY ABDOMEN AND PELVIS WITH CONTRAST AND WITHOUT CONTRAST TECHNIQUE: Multidetector CT imaging of the abdomen and pelvis was performed using the standard protocol during bolus administration of intravenous contrast. Multiplanar reconstructed images and MIPs were obtained and reviewed to evaluate the vascular anatomy. CONTRAST:  1103mL ISOVUE-370 IOPAMIDOL (ISOVUE-370) INJECTION 76% COMPARISON:  Prior CT scan of the abdomen and pelvis 12/03/2016 FINDINGS: VASCULAR Aorta: Tortuous abdominal aorta with atherosclerotic vascular calcifications. No evidence of aneurysm or dissection. Celiac: Atherosclerotic plaque at the origin results in perhaps mild stenosis. The splenic artery is heavily calcified. No evidence of aneurysm, or dissection. SMA: Heterogeneous atherosclerotic plaque at the origin results in at least mild stenosis. Replaced right hepatic artery. No aneurysm or dissection. Renals: Predominantly calcified atherosclerotic plaque results in moderate stenosis of the origins of the bilateral solitary renal arteries. IMA: Patent. Inflow: Scattered heterogeneous plaque without significant focal stenosis. Surgical clips overlie the left common femoral artery consistent with prior cutdown. Proximal Outflow: Minimal  atherosclerotic plaque without significant focal stenosis. Veins: Widely patent hepatic, portal, renal and visceral veins. No evidence of IVC or iliac venous thrombosis. No evidence of gastro renal shunt or significant gastric varices. Review of the MIP images confirms the above findings. NON-VASCULAR Lower chest: Incompletely imaged left  ventricular assist device and cardiac rhythm maintenance device. Incompletely imaged aortic valve prosthesis and tricuspid valve annuloplasty. Normal distal thoracic esophagus. Clusters of small nodules in the posterior aspect of the right lower lobe remain unchanged dating back to January of 2018 and are likely benign. Hepatobiliary: Normal hepatic contour and morphology. No discrete hepatic lesions. Normal appearance of the gallbladder. No intra or extrahepatic biliary ductal dilatation. Pancreas: Fatty atrophy of the pancreatic head and uncinate process. No pancreatic mass or inflammatory changes. Spleen: Normal in size without focal abnormality. Adrenals/Urinary Tract: Normal adrenal glands. Small but symmetric kidneys bilaterally. No enhancing renal mass. Several too small to characterize low-attenuation lesions are present in the right kidney. Statistically, these are highly likely benign cysts. No hydronephrosis or nephrolithiasis. Unremarkable ureters and bladder. Stomach/Bowel: Colonic diverticular disease without CT evidence of active inflammation. No evidence of focal bowel wall thickening or obstruction. No evidence of acute intraluminal bleeding. No large gastric varices identified. Lymphatic: No suspicious lymphadenopathy. Reproductive: Prostate is unremarkable. Other: No abdominal wall hernia or abnormality. No abdominopelvic ascites. Musculoskeletal: No acute fracture or aggressive appearing lytic or blastic osseous lesion. Scoliosis with multilevel degenerative disc disease and predominantly left-sided facet arthropathy most significant at L5-S1. IMPRESSION:  VASCULAR 1. No evidence of gastro renal shunt or significant gastric varices. 2. Extensive atherosclerotic vascular calcifications without evidence of aneurysm or dissection. Aortic Atherosclerosis (ICD10-170.0) 3. Incompletely imaged left ventricular assist device, valvular prostheses and cardiac rhythm maintenance device. 4. Probable mild to moderate bilateral renal artery stenoses. NON-VASCULAR 1. No evidence of acute intraluminal gastrointestinal bleeding. 2. Colonic diverticular disease without CT evidence of active inflammation. 3. Levoconvex scoliosis with multilevel advanced degenerative disc disease and facet arthropathy. Signed, Criselda Peaches, MD [facility] Radiology Electronically Signed   By: Jacqulynn Cadet M.D.   On: 03/23/2018 12:10      IMPRESSION:   *    Chronic lower GI bleeding, unable to locate exact source of bleeding but it appears to be in the distal small bowel or proximal colon.  Extensive radiologic and endoscopic testing.  *    Blood loss anemia.  Status post multiple PRBCs over the course of the last 3 weeks.  *   Heart failure.  S/P LVAD placement.  *    Chronic Coumadin on hold.  *    ESRD.  On hemodialysis    PLAN:     *   Capsule endoscopy ordered for tomorrow.  miniprep tonite with clears at dinner.     Azucena Freed  03/24/2018, 10:08 AM Phone [phone]   Attending physician's note   I have taken an interval history, reviewed the chart and examined the patient. I agree with the Advanced Practitioner's note, impression and recommendations.   72 y.o. yo male with ESRD on HD, MWF, CAD, CABG, AS S/P TAVR, CHF with ICD, S/P LVAD placement, here with obscure recurrent GI bleed.Coumadin was stopped.  He had extensive GI work-up as above, and attempted push enteroscopy and DBE at [facility] (unable to reach the bleeding site), s/pIR provocative mesenteric angiography  with TPA but unable to find a  bleeding lesion.   Plan: Capsule endoscopy for tomorrow after preparation. Trend CBC. May need Lanreotide thereafter.   Carmell Austria, MD

## 2018-03-24 NOTE — Progress Notes (Signed)
Md paged of hgb of 7.9. Per conversation with LVAD Coordinator on 5/20 call for HGB less than 8.0.  Pt has had 2 loose red BM's tonight.  Vs stable.  Pt feels very tired.  Will continue to monitor. Saunders Revel T

## 2018-03-24 NOTE — Progress Notes (Signed)
Md re-paged for hgb 7.9.

## 2018-03-24 NOTE — Progress Notes (Signed)
LVAD Coordinator notified about HGB 7.9. Pt tired.  Had 2 red stools during the night.  Will continue to monitor Trevor [name] T

## 2018-03-24 NOTE — Progress Notes (Signed)
Unable to give Danocrine medication per pharmacy unable to locate.

## 2018-03-24 NOTE — Progress Notes (Addendum)
Patient ID: Trevor [name], male   DOB: 09-17-1946, 72 y.o.   MRN: [id number]   Advanced Heart Failure VAD Team Note  PCP-Cardiologist: No primary care provider on file.   Subjective:    GI events  2018 colonoscopy. Blood throughout colon, but not in TI. Oozing tiny AVMS at hepatic flexure, treated with APC, endoclips. Three small small polyps not removed. Left colon tics.  02/27/18 EGD. Normal, no bleeding.  02/27/18 Colonoscopy. Blood in TI, sugg of SB source. Blood throughout colon, no source found. Descending and sigmoid tics. Multiple small polyps of ascending to sigmoid, not removed.  02/28/18 Capsule endo. Fresh blood at ileum, starting at 6 hours 58 mins. Blood obscured identification of lesion. Blood throughout colon. Per Dr Marin Shutter "Ileum (where bleeding) not reachable locally, and likely not reachable by anterograde double balloon enteroscopy at [facility]".Transfused 5 U PRBCs during admission.  At discharge continued on monthly octreotide and started Doxycycline 100 bid to try to limit AVM bleeding (and for URI sx)with plannedtransition to danazol. Goal INR decreased to 1.8 - 2.3  Admitted to 03/09/2018 with recurrent GI bleed. Coumadin was stopped. He received 3UPRBCs. Transferred to [facility] for deep entersospcopy on 03/10/2018.  Hospital course at [facility] included double retro ballon scope and he was sent for  IR embolization x 3 but he had no evidence of bleeding. Received 12 UPRBCs. He remained off anticoagulants.   Seen by general surgery - high risk for any surgical intervention would have to send back to [facility].   Seen by IR yesterday. CTA obtained, but no active bleeding found. Started on octreatide yesterday.   Dr Aundra Dubin spoke with surgery again. They recommended repeat capsule study to identify location of bleeding now that he seems to be bleeding less (no bleeding identified yesterday in IR).   He is off warfarin for now, INR 1.1  Received 2UPRBCs 5/20. + 1 unit  PRBC 5/21 for Hemoglobin 7.9 this am.   He had 3 maroon BM's overnight. Feels like he has more energy this morning.   LVAD INTERROGATION:  HeartMate 2 LVAD:   Flow 5.9 liters/min, speed 9200, power 6, PI 3.8. ~30 PI events  Objective:    Vital Signs:   Temp:  [97.4 F (36.3 C)-98.5 F (36.9 C)] 98 F (36.7 C) (05/21 0300) Pulse Rate:  [62-92] 80 (05/21 0757) Resp:  [16-20] 16 (05/21 0300) BP: (65-103)/(30-72) 86/61 (05/21 0757) SpO2:  [91 %-100 %] 98 % (05/21 0757) Weight:  [190 lb 14.7 oz (86.6 kg)-199 lb 1.2 oz (90.3 kg)] 190 lb 14.7 oz (86.6 kg) (05/21 0500) Last BM Date: 03/24/18 Mean arterial Pressure 70-80s  Intake/Output:   Intake/Output Summary (Last 24 hours) at 03/24/2018 0804 Last data filed at 03/24/2018 0611 Gross per 24 hour  Intake 1598.67 ml  Output 2217 ml  Net -618.33 ml     Physical Exam   GENERAL: Well appearing this am. NAD.  HEENT: Normal. NECK: Supple, JVP 7-8 cm. Carotids OK.  CARDIAC:  Mechanical heart sounds with LVAD hum present.  LUNGS:  CTAB, normal effort.  ABDOMEN:  NT, ND, no HSM. No bruits or masses. +BS  LVAD exit site: . Dressing dry and intact. No erythema or drainage. Stabilization device present and accurately applied. Driveline dressing changed daily per sterile technique. EXTREMITIES:  Warm and dry. No cyanosis, clubbing, rash, BLE 1+ edema  NEUROLOGIC:  Alert & oriented x 3. Cranial nerves grossly intact. Moves all 4 extremities w/o difficulty. Affect pleasant     Telemetry  A fib 70-80s. 11 beats NSVT. Personally reviewed  Labs   Basic Metabolic Panel: Recent Labs  Lab 03/21/18 0250 03/21/18 0815 03/22/18 5027 03/23/18 0108 03/24/18 0228  NA 138 135 134* 135 136  K 4.1 4.3 4.1 3.6 3.6  CL 102 100* 100* 98* 99*  CO2 29 27 25 30 30   GLUCOSE 125* 75 166* 93 145*  BUN 25* 28* 36* 20 12  CREATININE 4.48* 4.73* 5.46* 3.67* 2.48*  CALCIUM 7.6* 7.6* 7.2* 7.3* 7.2*  PHOS  --  2.7 2.3*  --   --     Liver Function  Tests: Recent Labs  Lab 03/21/18 0815 03/22/18 0608  ALBUMIN 2.4* 2.1*   No results for input(s): LIPASE, AMYLASE in the last 168 hours. No results for input(s): AMMONIA in the last 168 hours.  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 Evaluate for treatable gastric varices. EXAM: CT ANGIOGRAPHY ABDOMEN AND PELVIS WITH CONTRAST AND WITHOUT CONTRAST TECHNIQUE: Multidetector CT imaging of the abdomen and pelvis was performed using the standard protocol during bolus administration of intravenous contrast. Multiplanar reconstructed images and MIPs were obtained and reviewed to evaluate the vascular anatomy. CONTRAST:  139mL ISOVUE-370 IOPAMIDOL (ISOVUE-370) INJECTION 76% COMPARISON:  Prior CT scan of the abdomen and pelvis 12/03/2016 FINDINGS: VASCULAR Aorta: Tortuous abdominal aorta with atherosclerotic vascular calcifications. No evidence of aneurysm or dissection. Celiac: Atherosclerotic plaque at the origin results in perhaps mild stenosis. The splenic artery is heavily calcified. No evidence of aneurysm, or dissection. SMA: Heterogeneous atherosclerotic plaque at the origin results in at least mild stenosis. Replaced right hepatic artery. No aneurysm or dissection. Renals: Predominantly calcified atherosclerotic plaque results in moderate  stenosis of the origins of the bilateral solitary renal arteries. IMA: Patent. Inflow: Scattered heterogeneous plaque without significant focal stenosis. Surgical clips overlie the left common femoral artery consistent with prior cutdown. Proximal Outflow: Minimal atherosclerotic plaque without significant focal stenosis. Veins: Widely patent hepatic, portal, renal and visceral veins. No evidence of IVC or iliac venous thrombosis. No evidence of gastro renal shunt or significant gastric varices. Review of the MIP images confirms the above findings. NON-VASCULAR Lower chest: Incompletely imaged left ventricular assist device and cardiac rhythm maintenance device. Incompletely imaged aortic valve prosthesis and tricuspid valve annuloplasty. Normal distal thoracic esophagus. Clusters of small nodules in the posterior aspect of the right lower lobe remain unchanged dating back to January of 2018 and are likely benign. Hepatobiliary: Normal hepatic contour and morphology. No discrete hepatic lesions. Normal appearance of the gallbladder. No intra or extrahepatic biliary ductal dilatation. Pancreas: Fatty atrophy of the pancreatic head and uncinate process. No pancreatic mass or inflammatory changes. Spleen: Normal in size without focal abnormality. Adrenals/Urinary Tract: Normal  adrenal glands. Small but symmetric kidneys bilaterally. No enhancing renal mass. Several too small to characterize low-attenuation lesions are present in the right kidney. Statistically, these are highly likely benign cysts. No hydronephrosis or nephrolithiasis. Unremarkable ureters and bladder. Stomach/Bowel: Colonic diverticular disease without CT evidence of active inflammation. No evidence of focal bowel wall thickening or obstruction. No evidence of acute intraluminal bleeding. No large gastric varices identified. Lymphatic: No suspicious lymphadenopathy. Reproductive: Prostate is unremarkable. Other: No abdominal wall hernia or  abnormality. No abdominopelvic ascites. Musculoskeletal: No acute fracture or aggressive appearing lytic or blastic osseous lesion. Scoliosis with multilevel degenerative disc disease and predominantly left-sided facet arthropathy most significant at L5-S1. IMPRESSION: VASCULAR 1. No evidence of gastro renal shunt or significant gastric varices. 2. Extensive atherosclerotic vascular calcifications without evidence of aneurysm or dissection. Aortic Atherosclerosis (ICD10-170.0) 3. Incompletely imaged left ventricular assist device, valvular prostheses and cardiac rhythm maintenance device. 4. Probable mild to moderate bilateral renal artery stenoses. NON-VASCULAR 1. No evidence of acute intraluminal gastrointestinal bleeding. 2. Colonic diverticular disease without CT evidence of active inflammation. 3. Levoconvex scoliosis with multilevel advanced degenerative disc disease and facet arthropathy. Signed, Criselda Peaches, MD [facility] Radiology Electronically Signed   By: Jacqulynn Cadet M.D.   On: 03/23/2018 12:10     Medications:     Scheduled Medications: . atorvastatin  40 mg Oral q1800  . budesonide  0.25 mg Nebulization BID  . busPIRone  5 mg Oral BID  . calcitRIOL  0.5 mcg Oral Q M,W,F-HD  . danazol  100 mg Oral BID  . darbepoetin (ARANESP) injection - DIALYSIS  100 mcg Intravenous Q Mon-HD  . gabapentin  300 mg Oral BID  . insulin aspart  0-15 Units Subcutaneous TID WC  . insulin glargine  15 Units Subcutaneous QHS  . levothyroxine  25 mcg Oral QAC breakfast  . magnesium oxide  400 mg Oral Daily  . multivitamin  1 tablet Oral QHS  . pantoprazole  40 mg Oral Daily  . sevelamer carbonate  2,400 mg Oral TID WC  . sildenafil  40 mg Oral TID    Infusions: . sodium chloride    . sodium chloride    . sodium chloride    . sodium chloride    . octreotide  (SANDOSTATIN)    IV infusion 50 mcg/hr (03/24/18 0611)    PRN  Medications: acetaminophen, albuterol, ondansetron (ZOFRAN) IV, sevelamer carbonate, traMADol, traZODone   Assessment/Plan:    1. GI Bleed/Symptomatic Anemia: GI AVMs. Double balloon retro endoscopy at [facility], unable to identify bleeding lesion.  He had failed attempted IR embolization x 3 while at [facility]. 12 units PRBCs at [facility]. He had 3 units PRBCs here so far. Still with rectal bleeding, had more overnight.  - Received 2U PRBCs 5/19. 5.5>8.1 Give 1UPRBCs.  Hemoglobin 7.9 this am. Getting 1 unit PRBCs.   - INR 1.1 - LDH 174>223>224. Baseline LDH 200s.   - Continue octreotide drip and continue danazol 100 mg bid.  - IR consulted, but did not find any active bleeding on CTA yesterday. - GI consulted today for repeat capsule study now that he has less bleeding to identify location for possible surgery.  2. Chronic Systolic Heart Failure/RV Failure: HMIII LVAD placed 2017.  VAD parameters stable this morning. - LDH stable.   - Volume managed by HD - No bb with RV failure. Continue sildenafil 40 mg tid.  - Apply TED hose 3. ESRD- M/W/F/Sat. Dialyzed yesterday 4.  Chronic A fib: Off coumadin with GI bleed. No change 5. HTN: doppler MAPs 70-80s 6. Squamous Cell CA: s/p excision, margins at neck site not clear but has not wanted radiation.  7. DMII - Decrease lantus with hypoglycemia.  8. H/O CAD CABG. No s/s ischemia. 9. H/O TAVR: Valve stable on last echo. No CP. 10. NSVT - 11 beats overnight. Will check Mag  I reviewed the LVAD parameters from today, and compared the results to the patient's prior recorded data.  No programming changes were made.  The LVAD is functioning within specified parameters.  The patient performs LVAD self-test daily.  LVAD interrogation was negative for any significant power changes, alarms or PI events/speed drops.  LVAD equipment check completed and is in good working order.  Back-up equipment present.   LVAD education done on emergency procedures and precautions and  reviewed exit site care.  Length of Stay: [place], NP 03/24/2018, 8:04 AM  VAD Team --- VAD ISSUES ONLY--- Pager [phone] (7am - 7am)  Advanced Heart Failure Team  Pager [phone] (M-F; 7a - 4p)  Please contact [facility] for night-coverage after hours (4p -7a ) and weekends on [email or web address]  Patient seen with NP, agree with the above note.  Mr Verdell continues to have GI bleeding with maroon stools.  Hgb 7.9 today, will give another unit PRBCs.  He had CTA yesterday with IR, no active bleeding source noted so will not have intervention.  However, had 2 maroon stools overnight.   - Continue octreotide gtt.  - Starting danazol. - We do not have many options here, I do not think we can do anything endoscopically (have tried here and at [facility]).  Stools still suggest active bleeding.  We have not been able to localize anything by IR.  I talked with Dr. Donne Hazel with [facility] today.  If we can identify a bleeding region, surgery may be an option. I will ask GI to do a capsule endoscopy again.  Last capsule on 4/29 showed too much blood apparently to identify the exact site of bleeding.  I suspect it is just a slow ooze at this point so hopefully a general area can be identified.   We have him off all anticoagulation with active bleeding.  There is certainly increased risk for pump thrombosis but we really have no other option at this point. LDH stable.   Loralie Champagne 03/24/2018 8:49 AM

## 2018-03-24 NOTE — Progress Notes (Signed)
[facility] NEPHROLOGY PROGRESS NOTE  Assessment/ Plan: Pt is a 72 y.o. yo male  with ESRD on hemodialysis MWF at [facility]. He has complex past medical history of CAD, CABG, AS S/P TAVR, ischemic cardiomyopathy with ICD, S/P LVAD placement, here with recurrent GI bleed.Coumadin was stopped. He received blood transfusion.  Transferred to [facility] for deep entersospcopy on 03/10/2018. Hospital course at [facility] double retro ballon scope and he was sent for IR embolization x 3but he had no evidence of bleeding.  Assessment/Plan:  #Anemia/GI bleed: Status post multiple red blood cell transfusion. CT angio with no active bleeding. Hb 7.9. Plan for repeat capsule endoscopy. On Aranesp 100 micrograms weekly.  # ESRD: Monday Wednesday Friday, s/p HD yesterday, tolerated well, UF 2.2 kg. Plan for another dialysis tomorrow, goal UF 2-3 kg as tolerated with BP.   Left AV fistula for access  # Secondary hyperparathyroidism: Lower the dose of renvela. Continue calcitriol.  # HTN/volume: Patient is volume overload.  Try to do ultrafiltration if blood pressure tolerated.  Blood pressure on the lower side.  Continue to monitor.  #CAD, hx CABG, LVAD/ afib per cardiology  Subjective: Seen and examined at bedside.  Had bowel movement last night with a dark stool.  Denies chest pain, shortness of breath. Objective Vital signs in last 24 hours: Vitals:   03/23/18 2340 03/24/18 0300 03/24/18 0500 03/24/18 0757  BP:  (!) 91/52  (!) 86/61  Pulse: 92 76  80  Resp:  16    Temp: 98.5 F (36.9 C) 98 F (36.7 C)    TempSrc: Oral Oral    SpO2:  97%  98%  Weight: 88.8 kg (195 lb 12.3 oz)  86.6 kg (190 lb 14.7 oz)   Height:       Weight change: 2.2 kg (4 lb 13.6 oz)  Intake/Output Summary (Last 24 hours) at 03/24/2018 0832 Last data filed at 03/24/2018 0800 Gross per 24 hour  Intake 1838.67 ml  Output 2217 ml  Net -378.33 ml       Labs: Basic Metabolic Panel: Recent  Labs  Lab 03/21/18 0815 03/22/18 0608 03/23/18 0108 03/24/18 0228  NA 135 134* 135 136  K 4.3 4.1 3.6 3.6  CL 100* 100* 98* 99*  CO2 27 25 30 30   GLUCOSE 75 166* 93 145*  BUN 28* 36* 20 12  CREATININE 4.73* 5.46* 3.67* 2.48*  CALCIUM 7.6* 7.2* 7.3* 7.2*  PHOS 2.7 2.3*  --   --    Liver Function Tests: Recent Labs  Lab 03/21/18 0815 03/22/18 0608  ALBUMIN 2.4* 2.1*   No results for input(s): LIPASE, AMYLASE in the last 168 hours. No results for input(s): AMMONIA in the last 168 hours. CBC: Recent Labs  Lab 03/21/18 0815 03/22/18 0608 03/22/18 1206 03/23/18 0108 03/24/18 0228  WBC 8.8 6.8 9.6 8.6 9.7  NEUTROABS  --   --  7.7  --   --   HGB 7.5* 5.5* 8.0* 8.1* 7.9*  HCT 23.7* 17.2* 24.6* 25.0* 24.0*  MCV 89.4 89.6 86.9 88.0 87.3  PLT 148* 144* 162 155 155   Cardiac Enzymes: No results for input(s): CKTOTAL, CKMB, CKMBINDEX, TROPONINI in the last 168 hours. CBG: Recent Labs  Lab 03/23/18 1626 03/23/18 1751 03/23/18 2108 03/24/18 0756 03/24/18 0829  GLUCAP 133* 102* 136* 50* 84    Iron Studies: No results for input(s): IRON, TIBC, TRANSFERRIN, FERRITIN in the last 72 hours. 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 Evaluate for treatable gastric varices. EXAM: CT ANGIOGRAPHY ABDOMEN AND PELVIS WITH CONTRAST AND WITHOUT CONTRAST TECHNIQUE: Multidetector CT imaging of the abdomen and pelvis was performed using the standard protocol during bolus administration of intravenous contrast. Multiplanar reconstructed images and MIPs were obtained and reviewed to evaluate the vascular anatomy. CONTRAST:  157mL ISOVUE-370 IOPAMIDOL (ISOVUE-370) INJECTION 76% COMPARISON:  Prior CT scan of the abdomen and pelvis 12/03/2016 FINDINGS: VASCULAR Aorta: Tortuous  abdominal aorta with atherosclerotic vascular calcifications. No evidence of aneurysm or dissection. Celiac: Atherosclerotic plaque at the origin results in perhaps mild stenosis. The splenic artery is heavily calcified. No evidence of aneurysm, or dissection. SMA: Heterogeneous atherosclerotic plaque at the origin results in at least mild stenosis. Replaced right hepatic artery. No aneurysm or dissection. Renals: Predominantly calcified atherosclerotic plaque results in moderate stenosis of the origins of the bilateral solitary renal arteries. IMA: Patent. Inflow: Scattered heterogeneous plaque without significant focal stenosis. Surgical clips overlie the left common femoral artery consistent with prior cutdown. Proximal Outflow: Minimal atherosclerotic plaque without significant focal stenosis. Veins: Widely patent hepatic, portal, renal and visceral veins. No evidence of IVC or iliac venous thrombosis. No evidence of gastro renal shunt or significant gastric varices. Review of the MIP images confirms the above findings. NON-VASCULAR Lower chest: Incompletely imaged left ventricular assist device and cardiac rhythm maintenance device. Incompletely imaged aortic valve prosthesis and tricuspid valve annuloplasty. Normal distal thoracic esophagus. Clusters of small nodules in the posterior aspect of the right lower lobe remain unchanged dating back to January of 2018 and are likely benign. Hepatobiliary: Normal hepatic contour and morphology. No discrete hepatic lesions. Normal appearance of the gallbladder. No intra or extrahepatic biliary ductal dilatation. Pancreas: Fatty atrophy of the pancreatic head and uncinate process. No pancreatic mass or inflammatory changes. Spleen: Normal in size without focal abnormality. Adrenals/Urinary Tract: Normal adrenal glands. Small but symmetric kidneys bilaterally. No enhancing renal mass. Several too small to characterize low-attenuation lesions are present in the right  kidney. Statistically, these are highly likely benign cysts. No hydronephrosis or nephrolithiasis. Unremarkable ureters and bladder. Stomach/Bowel: Colonic diverticular disease without CT evidence of active inflammation. No evidence of focal bowel wall thickening or obstruction. No evidence of acute intraluminal bleeding. No large gastric varices identified. Lymphatic: No suspicious lymphadenopathy. Reproductive: Prostate is unremarkable. Other: No abdominal wall hernia or abnormality. No abdominopelvic ascites. Musculoskeletal: No acute fracture or aggressive appearing lytic or blastic osseous lesion. Scoliosis with multilevel degenerative disc disease and predominantly left-sided facet arthropathy most significant at L5-S1. IMPRESSION: VASCULAR 1. No evidence of gastro renal shunt or significant gastric varices. 2. Extensive atherosclerotic vascular calcifications without evidence of aneurysm or dissection. Aortic Atherosclerosis (ICD10-170.0) 3. Incompletely imaged left ventricular assist device, valvular prostheses and cardiac rhythm maintenance device. 4. Probable mild to moderate bilateral renal artery stenoses. NON-VASCULAR 1. No evidence of acute intraluminal gastrointestinal bleeding. 2. Colonic diverticular disease without CT evidence of active inflammation. 3. Levoconvex scoliosis with multilevel advanced degenerative disc disease and facet arthropathy. Signed, Criselda Peaches, MD [facility] Radiology Electronically Signed   By: Jacqulynn Cadet M.D.   On: 03/23/2018 12:10    Medications: Infusions: . sodium chloride    . sodium chloride    . sodium chloride    . sodium chloride    . octreotide  (  SANDOSTATIN)    IV infusion 50 mcg/hr (03/24/18 1586)    Scheduled Medications: . atorvastatin  40 mg Oral q1800  . budesonide  0.25 mg Nebulization BID  . busPIRone  5 mg Oral BID  . calcitRIOL  0.5 mcg Oral Q M,W,F-HD  . danazol  100 mg  Oral BID  . darbepoetin (ARANESP) injection - DIALYSIS  100 mcg Intravenous Q Mon-HD  . gabapentin  300 mg Oral BID  . insulin aspart  0-15 Units Subcutaneous TID WC  . insulin glargine  12 Units Subcutaneous QHS  . levothyroxine  25 mcg Oral QAC breakfast  . magnesium oxide  400 mg Oral Daily  . multivitamin  1 tablet Oral QHS  . pantoprazole  40 mg Oral Daily  . sevelamer carbonate  2,400 mg Oral TID WC  . sildenafil  40 mg Oral TID    have reviewed scheduled and prn medications.  Physical Exam: General:NAD, comfortable Heart:RRR, s1s2 nl Lungs: Air bilateral, no crackles Abdomen:soft, Non-tender, non-distended Extremities: Lower extremities  pitting edema ++, unchanged Dialysis Access: Left AV fistula has good thrill and bruit.  Dron Prasad Bhandari 03/24/2018,8:32 AM  LOS: 4 days

## 2018-03-24 NOTE — Progress Notes (Signed)
Hgb 7.4 post transfusion 1u PRBC, PA and VAD coordinator paged, awaiting direction.

## 2018-03-25 ENCOUNTER — Encounter (HOSPITAL_COMMUNITY)
Admission: AD | Disposition: A | Discharge status: Other Acute Inpatient Hospital | Payer: Self-pay | Source: Other Acute Inpatient Hospital | Attending: Internal Medicine

## 2018-03-25 ENCOUNTER — Inpatient Hospital Stay: Payer: Self-pay

## 2018-03-25 ENCOUNTER — Encounter: Payer: Self-pay | Admitting: Gastroenterology

## 2018-03-25 HISTORY — PX: GIVENS CAPSULE STUDY: SHX5432

## 2018-03-25 LAB — HEMOGLOBIN AND HEMATOCRIT, BLOOD
HCT: 29.3 % — ABNORMAL LOW (ref 39.0–52.0)
HEMOGLOBIN: 9.8 g/dL — AB (ref 13.0–17.0)

## 2018-03-25 LAB — GLUCOSE, CAPILLARY
GLUCOSE-CAPILLARY: 151 mg/dL — AB (ref 65–99)
GLUCOSE-CAPILLARY: 168 mg/dL — AB (ref 65–99)
GLUCOSE-CAPILLARY: 64 mg/dL — AB (ref 65–99)
GLUCOSE-CAPILLARY: 67 mg/dL (ref 65–99)
Glucose-Capillary: 80 mg/dL (ref 65–99)
Glucose-Capillary: 119 mg/dL — ABNORMAL HIGH (ref 65–99)
Glucose-Capillary: 268 mg/dL — ABNORMAL HIGH (ref 65–99)

## 2018-03-25 LAB — BASIC METABOLIC PANEL
Anion gap: 7 (ref 5–15)
BUN: 22 mg/dL — ABNORMAL HIGH (ref 6–20)
CALCIUM: 7.1 mg/dL — AB (ref 8.9–10.3)
CO2: 29 mmol/L (ref 22–32)
Chloride: 99 mmol/L — ABNORMAL LOW (ref 101–111)
Creatinine, Ser: 3.51 mg/dL — ABNORMAL HIGH (ref 0.61–1.24)
GFR, EST AFRICAN AMERICAN: 19 mL/min — AB (ref >60)
GFR, EST NON AFRICAN AMERICAN: 16 mL/min — AB (ref >60)
GLUCOSE: 141 mg/dL — AB (ref 65–99)
POTASSIUM: 3.7 mmol/L (ref 3.5–5.1)
Sodium: 135 mmol/L (ref 135–145)

## 2018-03-25 LAB — CBC
HEMATOCRIT: 24.8 % — AB (ref 39.0–52.0)
Hemoglobin: 8.1 g/dL — ABNORMAL LOW (ref 13.0–17.0)
MCH: 29.5 pg (ref 26.0–34.0)
MCHC: 32.7 g/dL (ref 30.0–36.0)
MCV: 90.2 fL (ref 78.0–100.0)
Platelets: 149 10*3/uL — ABNORMAL LOW (ref 150–400)
RBC: 2.75 MIL/uL — AB (ref 4.22–5.81)
RDW: 17.2 % — ABNORMAL HIGH (ref 11.5–15.5)
WBC: 9.5 10*3/uL (ref 4.0–10.5)

## 2018-03-25 LAB — PROTIME-INR
INR: 1.15
Prothrombin Time: 14.6 seconds (ref 11.4–15.2)

## 2018-03-25 LAB — PREPARE RBC (CROSSMATCH)

## 2018-03-25 LAB — LACTATE DEHYDROGENASE: LDH: 206 U/L — ABNORMAL HIGH (ref 98–192)

## 2018-03-25 LAB — MAGNESIUM: Magnesium: 1.8 mg/dL (ref 1.7–2.4)

## 2018-03-25 LAB — URIC ACID: Uric Acid, Serum: 3.3 mg/dL — ABNORMAL LOW (ref 4.4–7.6)

## 2018-03-25 SURGERY — IMAGING PROCEDURE, GI TRACT, INTRALUMINAL, VIA CAPSULE

## 2018-03-25 MED ORDER — MAGNESIUM SULFATE 2 GM/50ML IV SOLN
2.0000 g | Freq: Once | INTRAVENOUS | Status: AC
  Administered 2018-03-25: 2 g via INTRAVENOUS
  Filled 2018-03-25: qty 50

## 2018-03-25 MED ORDER — ALBUMIN HUMAN 5 % IV SOLN: 25.0000 g | Freq: Once | INTRAVENOUS | Status: AC

## 2018-03-25 MED ORDER — DIPHENHYDRAMINE HCL 25 MG PO CAPS
25.0000 mg | ORAL_CAPSULE | ORAL | Status: DC | PRN
  Administered 2018-03-25 (×2): 25 mg via ORAL
  Filled 2018-03-25 (×3): qty 1

## 2018-03-25 MED ORDER — ALBUMIN HUMAN 25 % IV SOLN
INTRAVENOUS | Status: AC
  Administered 2018-03-25: 25 g
  Filled 2018-03-25: qty 100

## 2018-03-25 MED ORDER — INSULIN GLARGINE 100 UNIT/ML ~~LOC~~ SOLN
8.0000 [IU] | Freq: Every day | SUBCUTANEOUS | Status: DC
  Administered 2018-03-25 – 2018-03-31 (×7): 8 [IU] via SUBCUTANEOUS
  Filled 2018-03-25 (×8): qty 0.08

## 2018-03-25 MED ORDER — DEXTROSE 50 % IV SOLN
INTRAVENOUS | Status: AC
  Administered 2018-03-25: 50 mL
  Filled 2018-03-25: qty 50

## 2018-03-25 MED ORDER — INSULIN ASPART 100 UNIT/ML ~~LOC~~ SOLN
0.0000 [IU] | Freq: Three times a day (TID) | SUBCUTANEOUS | Status: DC
  Administered 2018-03-25: 2 [IU] via SUBCUTANEOUS
  Administered 2018-03-26: 1 [IU] via SUBCUTANEOUS
  Administered 2018-03-26 (×2): 2 [IU] via SUBCUTANEOUS
  Administered 2018-03-27: 1 [IU] via SUBCUTANEOUS
  Administered 2018-03-27: 3 [IU] via SUBCUTANEOUS
  Administered 2018-03-28: 1 [IU] via SUBCUTANEOUS
  Administered 2018-03-29: 2 [IU] via SUBCUTANEOUS
  Administered 2018-03-30: 5 [IU] via SUBCUTANEOUS
  Administered 2018-03-31: 3 [IU] via SUBCUTANEOUS
  Administered 2018-03-31 (×2): 2 [IU] via SUBCUTANEOUS
  Administered 2018-04-01: 3 [IU] via SUBCUTANEOUS

## 2018-03-25 MED ORDER — SODIUM CHLORIDE 0.9 % IV SOLN
Freq: Once | INTRAVENOUS | Status: AC
  Administered 2018-03-25: 12:00:00 via INTRAVENOUS

## 2018-03-25 NOTE — Progress Notes (Addendum)
Patient ID: Trevor [name], male   DOB: 1946/01/07, 72 y.o.   MRN: [id number]   Advanced Heart Failure VAD Team Note  PCP-Cardiologist: No primary care provider on file.   Subjective:    GI events  2018 colonoscopy. Blood throughout colon, but not in TI. Oozing tiny AVMS at hepatic flexure, treated with APC, endoclips. Three small small polyps not removed. Left colon tics.  02/27/18 EGD. Normal, no bleeding.  02/27/18 Colonoscopy. Blood in TI, sugg of SB source. Blood throughout colon, no source found. Descending and sigmoid tics. Multiple small polyps of ascending to sigmoid, not removed.  02/28/18 Capsule endo. Fresh blood at ileum, starting at 6 hours 58 mins. Blood obscured identification of lesion. Blood throughout colon. Per Dr Marin Shutter "Ileum (where bleeding) not reachable locally, and likely not reachable by anterograde double balloon enteroscopy at [facility]".Transfused 5 U PRBCs during admission.  At discharge continued on monthly octreotide and started Doxycycline 100 bid to try to limit AVM bleeding (and for URI sx)with plannedtransition to danazol. Goal INR decreased to 1.8 - 2.3  Admitted to 03/09/2018 with recurrent GI bleed. Coumadin was stopped. He received 3UPRBCs. Transferred to [facility] for deep entersospcopy on 03/10/2018.  Hospital course at [facility] included double retro ballon scope and he was sent for  IR embolization x 3 but he had no evidence of bleeding. Received 12 UPRBCs. He remained off anticoagulants.   Seen by general surgery - high risk for any surgical intervention would have to send back to [facility]   Seen by IR 5/20. CTA obtained, but no active bleeding found. Started on octreotide.  GI consulted yesterday. Capsule endoscopy today. May need lanreotide.  He is off warfarin for now, INR 1.1  Received 2UPRBCs 5/20. + 1 unit PRBC 5/21. Hemoglobin 8.1 2 am this morning, but had 5 bloody BMs and appears pale. Will transfuse 2 units PRBCs.  NSVT this  morning. K 3.7, Mag 1.8  Feeling okay. Started capsule study this morning. Has HD today, but unable to get a time. Denies dizziness, SOB. Main complaint is left lateral knee pain that he hurt after PT exercises. It is painful to touch and bear weight.  LVAD INTERROGATION:  HeartMate 2 LVAD:   Flow 5.4 liters/min, speed 9200, power 6, PI 4.4. 36 PI events.   Objective:    Vital Signs:   Temp:  [97.6 F (36.4 C)-98.5 F (36.9 C)] 97.9 F (36.6 C) (05/22 0418) Pulse Rate:  [38-137] 38 (05/22 0425) Resp:  [16-17] 17 (05/21 1806) BP: (70-104)/(53-83) 87/69 (05/22 0425) SpO2:  [66 %-100 %] 100 % (05/22 0736) Weight:  [196 lb (88.9 kg)] 196 lb (88.9 kg) (05/22 0418) Last BM Date: 03/24/18 Mean arterial Pressure 70-80s  Intake/Output:   Intake/Output Summary (Last 24 hours) at 03/25/2018 0806 Last data filed at 03/24/2018 2020 Gross per 24 hour  Intake 1825.42 ml  Output -  Net 1825.42 ml     Physical Exam   GENERAL: Pale. NAD.  HEENT: Normal. NECK: Supple, JVP 7-8 cm. Carotids OK.  CARDIAC:  Mechanical heart sounds with LVAD hum present.  LUNGS:  CTAB, normal effort.  ABDOMEN:  NT, ND, no HSM. No bruits or masses. +BS  LVAD exit site: Dressing dry and intact. No erythema or drainage. Stabilization device present and accurately applied. Driveline dressing changed daily per sterile technique. EXTREMITIES:  Warm and dry. No cyanosis, clubbing, rash. BLE 1+ edema with TED hose on. Left lateral knee swollen and tender. Negative drawer and lachman tests.  NEUROLOGIC:  Alert & oriented x 3. Cranial nerves grossly intact. Moves all 4 extremities w/o difficulty. Affect pleasant    Telemetry   Afib 80s. Several runs of NSVT 12-22 beats this am. Personally reviewed.   Labs   Basic Metabolic Panel: Recent Labs  Lab 03/21/18 0815 03/22/18 9371 03/23/18 0108 03/24/18 0228 03/25/18 0221  NA 135 134* 135 136 135  K 4.3 4.1 3.6 3.6 3.7  CL 100* 100* 98* 99* 99*  CO2 27 25 30 30 29    GLUCOSE 75 166* 93 145* 141*  BUN 28* 36* 20 12 22*  CREATININE 4.73* 5.46* 3.67* 2.48* 3.51*  CALCIUM 7.6* 7.2* 7.3* 7.2* 7.1*  MG  --   --   --  1.5* 1.8  PHOS 2.7 2.3*  --   --   --     Liver Function Tests: Recent Labs  Lab 03/21/18 0815 03/22/18 0608  ALBUMIN 2.4* 2.1*   No results for input(s): LIPASE, AMYLASE in the last 168 hours. No results for input(s): AMMONIA in the last 168 hours.  CBC: Recent Labs  Lab 03/22/18 0608 03/22/18 1206 03/23/18 0108 03/24/18 0228 03/24/18 1535 03/25/18 0221  WBC 6.8 9.6 8.6 9.7  --  9.5  NEUTROABS  --  7.7  --   --   --   --   HGB 5.5* 8.0* 8.1* 7.9* 7.4* 8.1*  HCT 17.2* 24.6* 25.0* 24.0* 22.9* 24.8*  MCV 89.6 86.9 88.0 87.3  --  90.2  PLT 144* 162 155 155  --  149*    INR: Recent Labs  Lab 03/21/18 0250 03/22/18 0608 03/23/18 0108 03/24/18 0228 03/25/18 0221  INR 1.21 1.16 1.17 1.10 1.15    Other results:  EKG:    Imaging   Ct Angio Abd/pel W/ And/or W/o  Result Date: 03/23/2018 CLINICAL DATA:  72 year old male with end-stage renal failure on hemodialysis and chronic CHF with a left ventricular assist device in place. 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 Evaluate for treatable gastric varices. EXAM: CT ANGIOGRAPHY ABDOMEN AND PELVIS WITH CONTRAST AND WITHOUT CONTRAST TECHNIQUE: Multidetector CT imaging of the abdomen and pelvis was performed using the standard protocol during bolus administration of intravenous contrast. Multiplanar reconstructed images and MIPs were obtained and reviewed to evaluate the vascular anatomy. CONTRAST:  124mL ISOVUE-370 IOPAMIDOL (ISOVUE-370) INJECTION 76% COMPARISON:  Prior CT scan of the abdomen and pelvis 12/03/2016 FINDINGS: VASCULAR Aorta: Tortuous abdominal aorta with atherosclerotic vascular calcifications. No evidence of aneurysm or dissection. Celiac: Atherosclerotic plaque at the origin results in perhaps mild stenosis. The splenic artery is heavily calcified. No  evidence of aneurysm, or dissection. SMA: Heterogeneous atherosclerotic plaque at the origin results in at least mild stenosis. Replaced right hepatic artery. No aneurysm or dissection. Renals: Predominantly calcified atherosclerotic plaque results in moderate stenosis of the origins of the bilateral solitary renal arteries. IMA: Patent. Inflow: Scattered heterogeneous plaque without significant focal stenosis. Surgical clips overlie the left common femoral artery consistent with prior cutdown. Proximal Outflow: Minimal atherosclerotic plaque without significant focal stenosis. Veins: Widely patent hepatic, portal, renal and visceral veins. No evidence of IVC or iliac venous thrombosis. No evidence of gastro renal shunt or significant gastric varices. Review of the MIP images confirms the above findings. NON-VASCULAR Lower chest: Incompletely imaged left ventricular assist device and cardiac rhythm maintenance device. Incompletely imaged aortic valve prosthesis and tricuspid valve annuloplasty. Normal distal thoracic esophagus. Clusters of small nodules in the posterior aspect of the right lower lobe remain  unchanged dating back to January of 2018 and are likely benign. Hepatobiliary: Normal hepatic contour and morphology. No discrete hepatic lesions. Normal appearance of the gallbladder. No intra or extrahepatic biliary ductal dilatation. Pancreas: Fatty atrophy of the pancreatic head and uncinate process. No pancreatic mass or inflammatory changes. Spleen: Normal in size without focal abnormality. Adrenals/Urinary Tract: Normal adrenal glands. Small but symmetric kidneys bilaterally. No enhancing renal mass. Several too small to characterize low-attenuation lesions are present in the right kidney. Statistically, these are highly likely benign cysts. No hydronephrosis or nephrolithiasis. Unremarkable ureters and bladder. Stomach/Bowel: Colonic diverticular disease without CT evidence of active inflammation. No  evidence of focal bowel wall thickening or obstruction. No evidence of acute intraluminal bleeding. No large gastric varices identified. Lymphatic: No suspicious lymphadenopathy. Reproductive: Prostate is unremarkable. Other: No abdominal wall hernia or abnormality. No abdominopelvic ascites. Musculoskeletal: No acute fracture or aggressive appearing lytic or blastic osseous lesion. Scoliosis with multilevel degenerative disc disease and predominantly left-sided facet arthropathy most significant at L5-S1. IMPRESSION: VASCULAR 1. No evidence of gastro renal shunt or significant gastric varices. 2. Extensive atherosclerotic vascular calcifications without evidence of aneurysm or dissection. Aortic Atherosclerosis (ICD10-170.0) 3. Incompletely imaged left ventricular assist device, valvular prostheses and cardiac rhythm maintenance device. 4. Probable mild to moderate bilateral renal artery stenoses. NON-VASCULAR 1. No evidence of acute intraluminal gastrointestinal bleeding. 2. Colonic diverticular disease without CT evidence of active inflammation. 3. Levoconvex scoliosis with multilevel advanced degenerative disc disease and facet arthropathy. Signed, Criselda Peaches, MD [facility] Radiology Electronically Signed   By: Jacqulynn Cadet M.D.   On: 03/23/2018 12:10     Medications:     Scheduled Medications: . atorvastatin  40 mg Oral q1800  . busPIRone  5 mg Oral BID  . calcitRIOL  0.5 mcg Oral Q M,W,F-HD  . danazol  100 mg Oral BID  . darbepoetin (ARANESP) injection - DIALYSIS  100 mcg Intravenous Q Mon-HD  . gabapentin  300 mg Oral BID  . insulin aspart  0-15 Units Subcutaneous TID WC  . insulin glargine  12 Units Subcutaneous QHS  . levothyroxine  25 mcg Oral QAC breakfast  . magnesium oxide  400 mg Oral Daily  . mometasone-formoterol  2 puff Inhalation BID  . multivitamin  1 tablet Oral QHS  . pantoprazole  40 mg Oral Daily  . sevelamer  carbonate  1,600 mg Oral TID WC  . sildenafil  40 mg Oral TID    Infusions: . octreotide  (SANDOSTATIN)    IV infusion 50 mcg/hr (03/24/18 0611)    PRN Medications: acetaminophen, albuterol, ondansetron (ZOFRAN) IV, oxyCODONE-acetaminophen, sevelamer carbonate, traMADol, traZODone   Assessment/Plan:    1. GI Bleed/Symptomatic Anemia: GI AVMs. Double balloon retro endoscopy at [facility], unable to identify bleeding lesion.  He had failed attempted IR embolization x 3 while at [facility]. 12 units PRBCs at [facility]. He had 3 units PRBCs here so far. Still with rectal bleeding, had more overnight.  - Received 2U PRBCs 5/19. 5.5>8.1 Given 1UPRBCs 5/21.  Hemoglobin 8.1 this am, but has had 5 bloody BMs and appears pale. Transfuse 2 units PRBCs today.  - INR 1.1 - LDH 174>223>224>206. Baseline LDH 200s.   - Continue octreotide drip and continue danazol 100 mg bid.  - IR consulted, but did not find any active bleeding on CTA 5/20 - GI consulted. Repeat capsule study today now that he has less bleeding. Hopefully will be able to locate source. 2. Chronic Systolic  Heart Failure/RV Failure: HMIII LVAD placed 2017.  VAD parameters stable this morning. - LDH stable.   - Volume managed by HD. Going for HD today.  - No bb with RV failure. Continue sildenafil 40 mg tid.  - Apply TED hose 3. ESRD- M/W/F/Sat. HD today.  4. Chronic A fib: Off coumadin with GI bleed. No change.  5. HTN: doppler MAPs 70s 6. Squamous Cell CA: s/p excision, margins at neck site not clear but has not wanted radiation.  7. DMII - Lantus decreased with hypoglycemia in the am. Held overnight with low blood sugars last night. Pt ate very little for lunch or dinner yesterday. May need to hold again today with limited diet.  8. H/O CAD CABG. No s/s ischemia 9. H/O TAVR: Valve stable on last echo. No CP.  10. NSVT - K 3.6, mag 1.8. Will supp mag. HD today.  - Likely related to volume depletion from blood loss. Will transfuse this morning and  see if NSVT improves 11. Left knee pain - Check uric acid - Sounds more musculoskeletal. Occurred after PT exercises.  - Order Kpad. Percocet is helping.   I reviewed the LVAD parameters from today, and compared the results to the patient's prior recorded data.  No programming changes were made.  The LVAD is functioning within specified parameters.  The patient performs LVAD self-test daily.  LVAD interrogation was negative for any significant power changes, alarms or PI events/speed drops.  LVAD equipment check completed and is in good working order.  Back-up equipment present.   LVAD education done on emergency procedures and precautions and reviewed exit site care.  Length of Stay: [place], NP 03/25/2018, 8:06 AM  VAD Team --- VAD ISSUES ONLY--- Pager [phone] (7am - 7am)  Advanced Heart Failure Team  Pager [phone] (M-F; 7a - 4p)  Please contact [facility] for night-coverage after hours (4p -7a ) and weekends on [email or web address]  Patient seen with NP, agree with the above note. More maroon stools last night.  He had 1 unit PRBCs yesterday, hgb 8.1 today. He had CTA 5/20 with IR, no active bleeding source noted so no intervention.   - Will transfuse 2 units today with suspicion for increased bleeding.  He will have HD today.    - Continue octreotide gtt.  - Now on danazol. - We do not have many options here, I do not think we can do anything endoscopically (have tried here and at [facility]). Stools still suggest active bleeding.  We have not been able to localize anything by IR.  I talked with Dr. Donne Hazel with [facility] on 5/21.  If we can identify a bleeding region, surgery may be an option.  Last capsule on 4/29 showed too much blood apparently to identify the exact site of bleeding. Patient is currently undergoing capsule endoscopy.  I have also asked IR to consider repeat study with evidence for more active bleeding today.   We have him off all anticoagulation with active  bleeding. There is certainly increased risk for pump thrombosis but we really have no other option at this point. LDH stable.   Loralie Champagne 03/25/2018 9:21 AM

## 2018-03-25 NOTE — Progress Notes (Signed)
Bloody stool overnight.  Capsule study in process, will complete by 21:15 today.  Will download images tomorrow and, hopefully, get read tomorrow.    Azucena Freed PA-C

## 2018-03-25 NOTE — Progress Notes (Signed)
PT Cancellation Note  Patient Details Name: Trevor [name] MRN: [id number] DOB: 01-16-1946   Cancelled Treatment:    Reason Eval/Treat Not Completed: Medical issues which prohibited therapy. Pt with bloody stools overnight and continued bleeding requiring octreotide gtt and blood transfusion. Receiving capsule study. Will follow-up for PT treatment when appropriate.  Mabeline Caras, PT, DPT Acute Rehab Services  Pager: [place] 03/25/2018, 12:12 PM

## 2018-03-25 NOTE — Progress Notes (Signed)
Inpatient Diabetes Program Recommendations  AACE/ADA: New Consensus Statement on Inpatient Glycemic Control (2015)  Target Ranges:  Prepandial:   less than 140 mg/dL      Peak postprandial:   less than 180 mg/dL (1-2 hours)      Critically ill patients:  140 - 180 mg/dL   Lab Results  Component Value Date   GLUCAP 80 03/25/2018   HGBA1C 7.5 09/03/2017    Review of Glycemic Control Results for Trevor [name], Trevor [name] (MRN [id number]) as of 03/25/2018 08:55  Ref. Range 03/24/2018 16:27 03/24/2018 21:19 03/24/2018 23:54 03/25/2018 00:24 03/25/2018 01:13 03/25/2018 07:52  Glucose-Capillary Latest Ref Range: 65 - 99 mg/dL 130 (H) 68 64 (L) 67 151 (H) 80   Diabetes history: Type 2 DM Outpatient Diabetes medications: Lantus 15 units QHS Current orders for Inpatient glycemic control: Lantus 12 units QHS, Novolog 0-15 units TID  Inpatient Diabetes Program Recommendations:    Noted decrease to Lantus from 15 units to 12 units. Patient refused PM dose on 03/24/18. If FSBS continue to be <80 mg/dL, would consider decreasing Lantus to 8 units QHS.  Additionally, consider renal status, would consider decreasing correction to Novolog 0-9 units TID as patient could be stacking insulin doses.   Thanks, Bronson Curb, MSN, RNC-OB Diabetes Coordinator [phone] (8a-5p)

## 2018-03-25 NOTE — Progress Notes (Signed)
[facility] NEPHROLOGY PROGRESS NOTE  Assessment/ Plan: Pt is a 72 y.o. yo male  with ESRD on hemodialysis MWF at [facility]. He has complex past medical history of CAD, CABG, AS S/P TAVR, ischemic cardiomyopathy with ICD, S/P LVAD placement, here with recurrent GI bleed.Coumadin was stopped. He received blood transfusion.  Transferred to [facility] for deep entersospcopy on 03/10/2018. Hospital course at [facility] double retro ballon scope and he was sent for IR embolization x 3but he had no evidence of bleeding.  Assessment/Plan:  #Anemia/GI bleed: Status post multiple red blood cell transfusion. CT angio with no active bleeding.  Plan for  capsule endoscopy today by GI.  Ordered 2 unit of red blood cell transfusion today by cardiology.  On Aranesp 100 micrograms weekly.  # ESRD: Monday Wednesday Friday. Plan for another dialysis today, goal UF 2-3 kg as tolerated with BP.  Patient has chronic hypotension.  Need to monitor blood pressure closely.  He is asymptomatic.   Left AV fistula for access  # Secondary hyperparathyroidism: Lower the dose of renvela. Continue calcitriol.  Check Foss level.  #CAD, hx CABG, LVAD/ afib per cardiology  Subjective: Seen and examined at bedside.  Having capsule endoscopy.  Denies headache, dizziness, nausea vomiting chest pain shortness of breath. Objective Vital signs in last 24 hours: Vitals:   03/25/18 0418 03/25/18 0425 03/25/18 0736 03/25/18 0853  BP: (!) 70/53 (!) 87/69  (!) 85/54  Pulse: (!) 103 (!) 38  76  Resp:    16  Temp: 97.9 F (36.6 C)   97.8 F (36.6 C)  TempSrc: Oral   Oral  SpO2: 100% (!) 76% 100% 93%  Weight: 88.9 kg (196 lb)     Height:       Weight change: -1.395 kg (-3 lb 1.2 oz)  Intake/Output Summary (Last 24 hours) at 03/25/2018 1105 Last data filed at 03/24/2018 2020 Gross per 24 hour  Intake 1825.42 ml  Output -  Net 1825.42 ml       Labs: Basic Metabolic Panel: Recent Labs  Lab  03/21/18 0815 03/22/18 0608 03/23/18 0108 03/24/18 0228 03/25/18 0221  NA 135 134* 135 136 135  K 4.3 4.1 3.6 3.6 3.7  CL 100* 100* 98* 99* 99*  CO2 27 25 30 30 29   GLUCOSE 75 166* 93 145* 141*  BUN 28* 36* 20 12 22*  CREATININE 4.73* 5.46* 3.67* 2.48* 3.51*  CALCIUM 7.6* 7.2* 7.3* 7.2* 7.1*  PHOS 2.7 2.3*  --   --   --    Liver Function Tests: Recent Labs  Lab 03/21/18 0815 03/22/18 0608  ALBUMIN 2.4* 2.1*   No results for input(s): LIPASE, AMYLASE in the last 168 hours. No results for input(s): AMMONIA in the last 168 hours. CBC: Recent Labs  Lab 03/22/18 0608 03/22/18 1206 03/23/18 0108 03/24/18 0228 03/24/18 1535 03/25/18 0221  WBC 6.8 9.6 8.6 9.7  --  9.5  NEUTROABS  --  7.7  --   --   --   --   HGB 5.5* 8.0* 8.1* 7.9* 7.4* 8.1*  HCT 17.2* 24.6* 25.0* 24.0* 22.9* 24.8*  MCV 89.6 86.9 88.0 87.3  --  90.2  PLT 144* 162 155 155  --  149*   Cardiac Enzymes: No results for input(s): CKTOTAL, CKMB, CKMBINDEX, TROPONINI in the last 168 hours. CBG: Recent Labs  Lab 03/24/18 2119 03/24/18 2354 03/25/18 0024 03/25/18 0113 03/25/18 0752  GLUCAP 68 64* 67 151* 80    Iron Studies:  No results for input(s): IRON, TIBC, TRANSFERRIN, FERRITIN in the last 72 hours. Studies/Results: Ct Angio Abd/pel W/ And/or W/o  Result Date: 03/23/2018 CLINICAL DATA:  72 year old male with end-stage renal failure on hemodialysis and chronic CHF with a left ventricular assist device in place. 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 Evaluate for treatable gastric varices. EXAM: CT ANGIOGRAPHY ABDOMEN AND PELVIS WITH CONTRAST AND WITHOUT CONTRAST TECHNIQUE: Multidetector CT imaging of the abdomen and pelvis was performed using the standard protocol during bolus administration of intravenous contrast. Multiplanar reconstructed images and MIPs were obtained and reviewed to evaluate the vascular anatomy. CONTRAST:  138mL ISOVUE-370 IOPAMIDOL (ISOVUE-370) INJECTION 76% COMPARISON:  Prior  CT scan of the abdomen and pelvis 12/03/2016 FINDINGS: VASCULAR Aorta: Tortuous abdominal aorta with atherosclerotic vascular calcifications. No evidence of aneurysm or dissection. Celiac: Atherosclerotic plaque at the origin results in perhaps mild stenosis. The splenic artery is heavily calcified. No evidence of aneurysm, or dissection. SMA: Heterogeneous atherosclerotic plaque at the origin results in at least mild stenosis. Replaced right hepatic artery. No aneurysm or dissection. Renals: Predominantly calcified atherosclerotic plaque results in moderate stenosis of the origins of the bilateral solitary renal arteries. IMA: Patent. Inflow: Scattered heterogeneous plaque without significant focal stenosis. Surgical clips overlie the left common femoral artery consistent with prior cutdown. Proximal Outflow: Minimal atherosclerotic plaque without significant focal stenosis. Veins: Widely patent hepatic, portal, renal and visceral veins. No evidence of IVC or iliac venous thrombosis. No evidence of gastro renal shunt or significant gastric varices. Review of the MIP images confirms the above findings. NON-VASCULAR Lower chest: Incompletely imaged left ventricular assist device and cardiac rhythm maintenance device. Incompletely imaged aortic valve prosthesis and tricuspid valve annuloplasty. Normal distal thoracic esophagus. Clusters of small nodules in the posterior aspect of the right lower lobe remain unchanged dating back to January of 2018 and are likely benign. Hepatobiliary: Normal hepatic contour and morphology. No discrete hepatic lesions. Normal appearance of the gallbladder. No intra or extrahepatic biliary ductal dilatation. Pancreas: Fatty atrophy of the pancreatic head and uncinate process. No pancreatic mass or inflammatory changes. Spleen: Normal in size without focal abnormality. Adrenals/Urinary Tract: Normal adrenal glands. Small but symmetric kidneys bilaterally. No enhancing renal mass. Several  too small to characterize low-attenuation lesions are present in the right kidney. Statistically, these are highly likely benign cysts. No hydronephrosis or nephrolithiasis. Unremarkable ureters and bladder. Stomach/Bowel: Colonic diverticular disease without CT evidence of active inflammation. No evidence of focal bowel wall thickening or obstruction. No evidence of acute intraluminal bleeding. No large gastric varices identified. Lymphatic: No suspicious lymphadenopathy. Reproductive: Prostate is unremarkable. Other: No abdominal wall hernia or abnormality. No abdominopelvic ascites. Musculoskeletal: No acute fracture or aggressive appearing lytic or blastic osseous lesion. Scoliosis with multilevel degenerative disc disease and predominantly left-sided facet arthropathy most significant at L5-S1. IMPRESSION: VASCULAR 1. No evidence of gastro renal shunt or significant gastric varices. 2. Extensive atherosclerotic vascular calcifications without evidence of aneurysm or dissection. Aortic Atherosclerosis (ICD10-170.0) 3. Incompletely imaged left ventricular assist device, valvular prostheses and cardiac rhythm maintenance device. 4. Probable mild to moderate bilateral renal artery stenoses. NON-VASCULAR 1. No evidence of acute intraluminal gastrointestinal bleeding. 2. Colonic diverticular disease without CT evidence of active inflammation. 3. Levoconvex scoliosis with multilevel advanced degenerative disc disease and facet arthropathy. Signed, Criselda Peaches, MD [facility] Electronically Signed   By: Jacqulynn Cadet M.D.   On: 03/23/2018 12:10    Medications: Infusions: . sodium chloride    .  magnesium sulfate 1 - 4 g bolus IVPB 2 g (03/25/18 1011)  . octreotide  (SANDOSTATIN)    IV infusion 50 mcg/hr (03/24/18 2229)    Scheduled Medications: . atorvastatin  40 mg Oral q1800  . busPIRone  5 mg Oral BID  . calcitRIOL  0.5 mcg Oral Q  M,W,F-HD  . danazol  100 mg Oral BID  . darbepoetin (ARANESP) injection - DIALYSIS  100 mcg Intravenous Q Mon-HD  . gabapentin  300 mg Oral BID  . insulin aspart  0-9 Units Subcutaneous TID WC  . insulin glargine  8 Units Subcutaneous QHS  . levothyroxine  25 mcg Oral QAC breakfast  . magnesium oxide  400 mg Oral Daily  . mometasone-formoterol  2 puff Inhalation BID  . multivitamin  1 tablet Oral QHS  . pantoprazole  40 mg Oral Daily  . sevelamer carbonate  1,600 mg Oral TID WC  . sildenafil  40 mg Oral TID    have reviewed scheduled and prn medications.  Physical Exam: General: Not in distress, comfortable Heart:RRR, s1s2 nl Lungs: Bilateral clear, no crackles Abdomen:soft, Non-tender, non-distended Extremities: Lower extremities pitting edema unchanged Dialysis Access: Left AV fistula has good thrill and bruit.  Dron Prasad Bhandari 03/25/2018,11:05 AM  LOS: 5 days

## 2018-03-25 NOTE — Progress Notes (Signed)
MD notified about 22 beat run wide QRS patient asymptomatic at 0523. Patient does c/o left knee pain. CBG dropped to 60's and glucose gel and dextrose IV given, night dose of Lantus not given. Patient G.I. prepped per orders and had bright red loose stool. I will continue to monitor.

## 2018-03-25 NOTE — Plan of Care (Signed)
Pt remains to be bleeding requiring octreotide gtt and blood transfusion, capsule study in process. Continue current care plan

## 2018-03-25 NOTE — Progress Notes (Signed)
LVAD Coordinator Rounding Note:  Re-admitted from [facility] on 03/20/18 with continuing GI bleed.    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 Darcey Nora under Destination Therapy criteria due to age excluding heart transplantation.  Pt sitting in bed; GI nurse administering capsule study at bedside. Pt pale and weak; reports 5 bloody stools overnight.  Hgb at 2:00 am today 8.1; has had several large bloody stools since. Having short runs of NSVT. Will receive more PC's today per Caryl Pina, NP.   Pt for dialysis today, lab does not know what time they will be able to complete today.    Still c/o left knee pain; pain relieved with oxy and warm packs.   Vital signs: Temp:  97.8 HR: 76 Doppler Pressure:  76 Automatic BP:  85/54 (65) O2 Sat: 93% RA Wt: 197>194>192>190>196 lbs  LVAD interrogation reveals:  Speed:  9200 Flow:  6.0 Power: 6.1 w PI: 3.4 Alarms: none Events:  37 PI events on 03/24/18 Fixed speed: 9200 Low speed limit: 8600  Primary Controller: Replace back up battery in 7 months Back up controller: Replace back up battery in 27month   Drive Line: Right abdominal Sorbaview dressing with Tegaderm covering intact. Anchor intact and accurately applied. Pt has weekly dressing changes using weekly kit; rinse with sterile saline after Chloraprep applicator, no skin prep or Bio patch used due to skin irritation.   Weekly dressing changes per bedside RN - next dressing change due 03/29/18.  Labs:  LDH trend: 224>191>174>223>224>206  INR trend: coumadin stopped  Hgb trend: 9.5>8.2>7.2>5.5>8.0>8.1>7.9>8.1  Anticoagulation Plan: - INR Goal: coumadin stopped - ASA Dose: none due to hx of GI bleed - OP monthly Octreotide for hx of GI bleed  Blood Products:  - 12 units PC's [place] [facility]- 03/22/18> 2 units PC's - 03/23/18> 2 unit PC - 03/24/18> 2 unit PC - 03/25/18> plan for 2 units today  Device: - Medtronic single lead -Therapies: on 231 bpm  Arrythmias: chronic  afib  Renal:  [facility] Adverse Events on VAD: - 02/2017> GIB- AVM clipped x2 - 07/2017> renal failure- HD started - 11/2017>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 Margins not clear at neck. - 02/23/18> hospitalization for GI bleed. Colon/EGD 02/27/18 with normal EGD. Colonoscopy identified active bleeding in ileum; source not found. Capsule Endoscopy 02/27/18: Showed active bleeding in the ileum. Received 5 units blood with histamine reaction; will give benadryl as OP. - [facility]10[facility] sent to IR emolization x 3 with no evidence of bleeding. Received 12 units PCs, remained off anticoagulants. [facility]  Plan/Recommendations: 1.  Weekly dressing changes with special instructions (above). Bedside RN may change dressing; next due              03/30/18. 2.  [name] films delivered to Dr. HBarbie Bannerin IR for his review. He will contact Dr. MAundra Dubinafterward.  3.  Call VAD pager if any questions re: VAD equipment or drive line site issues.   MZada GirtRN, VAD Coordinator 24/7 VAD pager: [phone]

## 2018-03-25 NOTE — Progress Notes (Signed)
Tolerated capsule well,start time 08:15 end 20:15.

## 2018-03-26 ENCOUNTER — Inpatient Hospital Stay (HOSPITAL_COMMUNITY): Payer: Medicare Other

## 2018-03-26 ENCOUNTER — Encounter (HOSPITAL_COMMUNITY): Payer: Self-pay | Admitting: Gastroenterology

## 2018-03-26 DIAGNOSIS — Q2730 Arteriovenous malformation, site unspecified: Secondary | ICD-10-CM

## 2018-03-26 LAB — PREPARE RBC (CROSSMATCH)

## 2018-03-26 LAB — CBC
HEMATOCRIT: 24.1 % — AB (ref 39.0–52.0)
HEMOGLOBIN: 7.9 g/dL — AB (ref 13.0–17.0)
MCH: 29.2 pg (ref 26.0–34.0)
MCHC: 32.8 g/dL (ref 30.0–36.0)
MCV: 88.9 fL (ref 78.0–100.0)
Platelets: 120 10*3/uL — ABNORMAL LOW (ref 150–400)
RBC: 2.71 MIL/uL — ABNORMAL LOW (ref 4.22–5.81)
RDW: 16.4 % — ABNORMAL HIGH (ref 11.5–15.5)
WBC: 7.9 10*3/uL (ref 4.0–10.5)

## 2018-03-26 LAB — MAGNESIUM: Magnesium: 1.9 mg/dL (ref 1.7–2.4)

## 2018-03-26 LAB — SYNOVIAL CELL COUNT + DIFF, W/ CRYSTALS
Crystals, Fluid: NONE SEEN
Eosinophils-Synovial: 0 % (ref 0–1)
LYMPHOCYTES-SYNOVIAL FLD: 0 % (ref 0–20)
MONOCYTE-MACROPHAGE-SYNOVIAL FLUID: 9 % — AB (ref 50–90)
NEUTROPHIL, SYNOVIAL: 91 % — AB (ref 0–25)
WBC, Synovial: 9100 /mm3 — ABNORMAL HIGH (ref 0–200)

## 2018-03-26 LAB — RENAL FUNCTION PANEL
ANION GAP: 6 (ref 5–15)
Albumin: 2.2 g/dL — ABNORMAL LOW (ref 3.5–5.0)
BUN: 14 mg/dL (ref 6–20)
CHLORIDE: 101 mmol/L (ref 101–111)
CO2: 29 mmol/L (ref 22–32)
Calcium: 7.1 mg/dL — ABNORMAL LOW (ref 8.9–10.3)
Creatinine, Ser: 3.04 mg/dL — ABNORMAL HIGH (ref 0.61–1.24)
GFR calc non Af Amer: 19 mL/min — ABNORMAL LOW (ref >60)
GFR, EST AFRICAN AMERICAN: 22 mL/min — AB (ref >60)
Glucose, Bld: 222 mg/dL — ABNORMAL HIGH (ref 65–99)
Phosphorus: 2.2 mg/dL — ABNORMAL LOW (ref 2.5–4.6)
Potassium: 4 mmol/L (ref 3.5–5.1)
Sodium: 136 mmol/L (ref 135–145)

## 2018-03-26 LAB — HEMOGLOBIN AND HEMATOCRIT, BLOOD
HEMATOCRIT: 26.5 % — AB (ref 39.0–52.0)
HEMOGLOBIN: 8.6 g/dL — AB (ref 13.0–17.0)

## 2018-03-26 LAB — LACTATE DEHYDROGENASE: LDH: 185 U/L (ref 98–192)

## 2018-03-26 LAB — PROTIME-INR
INR: 1.23
PROTHROMBIN TIME: 15.4 s — AB (ref 11.4–15.2)

## 2018-03-26 LAB — GLUCOSE, CAPILLARY
GLUCOSE-CAPILLARY: 136 mg/dL — AB (ref 65–99)
GLUCOSE-CAPILLARY: 183 mg/dL — AB (ref 65–99)
Glucose-Capillary: 188 mg/dL — ABNORMAL HIGH (ref 65–99)
Glucose-Capillary: 171 mg/dL — ABNORMAL HIGH (ref 65–99)

## 2018-03-26 MED ORDER — CHLORHEXIDINE GLUCONATE CLOTH 2 % EX PADS
6.0000 | MEDICATED_PAD | Freq: Every day | CUTANEOUS | Status: DC
  Administered 2018-03-26 – 2018-03-29 (×2): 6 via TOPICAL

## 2018-03-26 MED ORDER — SODIUM CHLORIDE 0.9 % IV SOLN
Freq: Once | INTRAVENOUS | Status: AC
  Administered 2018-03-26: 11:00:00 via INTRAVENOUS

## 2018-03-26 MED ORDER — BUPIVACAINE HCL (PF) 0.5 % IJ SOLN
10.0000 mL | Freq: Once | INTRAMUSCULAR | Status: AC
  Administered 2018-03-26: 10 mL
  Filled 2018-03-26 (×2): qty 10

## 2018-03-26 MED ORDER — METHYLPREDNISOLONE ACETATE 40 MG/ML IJ SUSP
40.0000 mg | Freq: Once | INTRAMUSCULAR | Status: AC
  Administered 2018-03-26: 40 mg via INTRA_ARTICULAR
  Filled 2018-03-26 (×2): qty 1

## 2018-03-26 MED ORDER — DIPHENHYDRAMINE HCL 25 MG PO CAPS
25.0000 mg | ORAL_CAPSULE | Freq: Once | ORAL | Status: AC
  Administered 2018-03-26: 25 mg via ORAL
  Filled 2018-03-26: qty 1

## 2018-03-26 NOTE — Progress Notes (Signed)
Patient ID: Trevor [name], male   DOB: 1946-01-18, 72 y.o.   MRN: [id number]   Advanced Heart Failure VAD Team Note  PCP-Cardiologist: No primary care provider on file.   Subjective:    GI events  2018 colonoscopy. Blood throughout colon, but not in TI. Oozing tiny AVMS at hepatic flexure, treated with APC, endoclips. Three small small polyps not removed. Left colon tics.  02/27/18 EGD. Normal, no bleeding.  02/27/18 Colonoscopy. Blood in TI, sugg of SB source. Blood throughout colon, no source found. Descending and sigmoid tics. Multiple small polyps of ascending to sigmoid, not removed.  02/28/18 Capsule endo. Fresh blood at ileum, starting at 6 hours 58 mins. Blood obscured identification of lesion. Blood throughout colon. Per Dr Marin Shutter "Ileum (where bleeding) not reachable locally, and likely not reachable by anterograde double balloon enteroscopy at [facility]".Transfused 5 U PRBCs during admission.  At discharge continued on monthly octreotide and started Doxycycline 100 bid to try to limit AVM bleeding (and for URI sx)with plannedtransition to danazol. Goal INR decreased to 1.8 - 2.3 03/09/18: Admitted with recurrent GI bleed. Coumadin was stopped. He received 3UPRBCs. Transferred to [facility] for deep entersospcopy on 03/10/2018. Hospital course at [facility] included double retro ballon scope and he was sent for  IR embolization x 3 but he had no evidence of bleeding. Received 12 UPRBCs. He remained off anticoagulants.    Seen by IR 5/20. CTA obtained, but no active bleeding found. Started on octreotide.  Repeat capsule endoscopy completed 03/25/18, awaiting result.  He is off warfarin for now, INR 1.2  Received 2UPRBCs 5/20. + 1 unit PRBC 5/21 + 2 PRBC 5/22 -> hemoglobin 9.8 -> 7.9 this morning  Still with swollen, painful left knee.  Thinks he twisted it getting out of bed.  Not able to bear much weight.  Had HD yesterday.  No dyspnea.  Still with hematochezia episodes.   LVAD  INTERROGATION:  HeartMate 2 LVAD:   Flow 5.6 liters/min, speed 9200, power 6, PI 2.9, 60 PI events.   Objective:    Vital Signs:   Temp:  [97.5 F (36.4 C)-98.7 F (37.1 C)] 97.8 F (36.6 C) (05/23 0300) Pulse Rate:  [43-91] 43 (05/23 0300) Resp:  [15-18] 16 (05/23 0300) BP: (70-118)/(24-94) 80/56 (05/23 0301) SpO2:  [92 %-95 %] 93 % (05/23 0300) Weight:  [193 lb 2 oz (87.6 kg)-201 lb 8 oz (91.4 kg)] 193 lb 2 oz (87.6 kg) (05/23 0300) Last BM Date: 03/25/18 Mean arterial Pressure 70s-80s  Intake/Output:   Intake/Output Summary (Last 24 hours) at 03/26/2018 0743 Last data filed at 03/26/2018 0600 Gross per 24 hour  Intake 2605 ml  Output 3006 ml  Net -401 ml     Physical Exam   GENERAL: NAD.  HEENT: Normal. NECK: Supple, JVP 7-8 cm. Carotids OK.  CARDIAC:  Mechanical heart sounds with LVAD hum present.  LUNGS:  CTAB, normal effort.  ABDOMEN:  NT, ND, no HSM. No bruits or masses. +BS  LVAD exit site: Dressing dry and intact. No erythema or drainage. Stabilization device present and accurately applied. Driveline dressing changed daily per sterile technique. EXTREMITIES:  Warm and dry. No cyanosis, clubbing, rash, or edema. BLE 1+ edema with TEd hose. Left lateral knee swollen and tender. Negative drawer and lachman tests NEUROLOGIC:  Alert & oriented x 3. Cranial nerves grossly intact. Moves all 4 extremities w/o difficulty. Affect pleasant    Telemetry   Atrial fibrillation 80s, personally reviewed.   Labs   Basic  Metabolic Panel: Recent Labs  Lab 03/21/18 0815 03/22/18 5329 03/23/18 0108 03/24/18 0228 03/25/18 0221 03/26/18 0258  NA 135 134* 135 136 135 136  K 4.3 4.1 3.6 3.6 3.7 4.0  CL 100* 100* 98* 99* 99* 101  CO2 27 25 30 30 29 29   GLUCOSE 75 166* 93 145* 141* 222*  BUN 28* 36* 20 12 22* 14  CREATININE 4.73* 5.46* 3.67* 2.48* 3.51* 3.04*  CALCIUM 7.6* 7.2* 7.3* 7.2* 7.1* 7.1*  MG  --   --   --  1.5* 1.8 1.9  PHOS 2.7 2.3*  --   --   --  2.2*     Liver Function Tests: Recent Labs  Lab 03/21/18 0815 03/22/18 0608 03/26/18 0258  ALBUMIN 2.4* 2.1* 2.2*   No results for input(s): LIPASE, AMYLASE in the last 168 hours. No results for input(s): AMMONIA in the last 168 hours.  CBC: Recent Labs  Lab 03/22/18 1206 03/23/18 0108 03/24/18 0228 03/24/18 1535 03/25/18 0221 03/25/18 1938 03/26/18 0258  WBC 9.6 8.6 9.7  --  9.5  --  7.9  NEUTROABS 7.7  --   --   --   --   --   --   HGB 8.0* 8.1* 7.9* 7.4* 8.1* 9.8* 7.9*  HCT 24.6* 25.0* 24.0* 22.9* 24.8* 29.3* 24.1*  MCV 86.9 88.0 87.3  --  90.2  --  88.9  PLT 162 155 155  --  149*  --  120*    INR: Recent Labs  Lab 03/22/18 0608 03/23/18 0108 03/24/18 0228 03/25/18 0221 03/26/18 0258  INR 1.16 1.17 1.10 1.15 1.23    Other results:  EKG:    Imaging   No results found.   Medications:     Scheduled Medications: . atorvastatin  40 mg Oral q1800  . busPIRone  5 mg Oral BID  . calcitRIOL  0.5 mcg Oral Q M,W,F-HD  . danazol  100 mg Oral BID  . darbepoetin (ARANESP) injection - DIALYSIS  100 mcg Intravenous Q Mon-HD  . gabapentin  300 mg Oral BID  . insulin aspart  0-9 Units Subcutaneous TID WC  . insulin glargine  8 Units Subcutaneous QHS  . levothyroxine  25 mcg Oral QAC breakfast  . magnesium oxide  400 mg Oral Daily  . mometasone-formoterol  2 puff Inhalation BID  . multivitamin  1 tablet Oral QHS  . pantoprazole  40 mg Oral Daily  . sevelamer carbonate  1,600 mg Oral TID WC  . sildenafil  40 mg Oral TID    Infusions: . octreotide  (SANDOSTATIN)    IV infusion 50 mcg/hr (03/26/18 0600)    PRN Medications: acetaminophen, albuterol, diphenhydrAMINE, ondansetron (ZOFRAN) IV, oxyCODONE-acetaminophen, sevelamer carbonate, traMADol, traZODone   Assessment/Plan:    1. GI Bleed/Symptomatic Anemia: GI AVMs. Double balloon retro endoscopy at [facility], unable to identify bleeding lesion.  He had failed attempted IR embolization x 3 while at [facility]. 12 units  PRBCs at [facility]. He had 3 units PRBCs here so far. Still with rectal bleeding, had more overnight.  - Received 2U PRBCs 5/19. 5.5>8.1 Given 1UPRBCs 5/21. 2UPRBCs 5/22. Hemoglobin 7.9 => transfuse 1 unit today.  - INR 1.2 - LDH 174>223>224>206>185. Baseline LDH 200s.   - Continue octreotide drip and continue danazol 100 mg bid.  - IR consulted, but did not find any active bleeding on CTA 5/20 - GI consulted. Repeat capsule study completed yesterday. Results not yet available.  - If we can localize bleeding site, will  talk with surgery again.   2. Chronic Systolic Heart Failure/RV Failure: HMIII LVAD placed 2017.  VAD parameters stable this morning. - LDH stable.   - Volume managed by HD. Had HD yesterday - No bb with RV failure. Continue sildenafil 40 mg tid.  - Continue TED hose 3. ESRD- M/W/F/Sat. HD yesterday 4. Chronic A fib: Off coumadin with GI bleed. No change 5. HTN: doppler MAPs 60-80s 6. Squamous Cell CA: s/p excision, margins at neck site not clear but has not wanted radiation.  7. DMII - Lantus DCd for now with hypoglycemia. Continue SSI.  8. H/O CAD CABG. No s/s ischemia 9. H/O TAVR: Valve stable on last echo. No change.  10. NSVT - K 4.0, mag 1.9. Will supp mag.  - Likely related to volume depletion from blood loss.  11. Left knee pain  - Uric acid 3.3 - Sounds more musculoskeletal. Occurred after PT exercises.  - Order Kpad. Percocet is helping.  - He is unable to bear weight per RN staff. - Will ask ortho to see.   I reviewed the LVAD parameters from today, and compared the results to the patient's prior recorded data.  No programming changes were made.  The LVAD is functioning within specified parameters.  The patient performs LVAD self-test daily.  LVAD interrogation was negative for any significant power changes, alarms or PI events/speed drops.  LVAD equipment check completed and is in good working order.  Back-up equipment present.   LVAD education done on emergency  procedures and precautions and reviewed exit site care.  Length of Stay: Antoine, NP 03/26/2018, 7:43 AM  VAD Team --- VAD ISSUES ONLY--- Pager [phone] (7am - 7am)  Advanced Heart Failure Team  Pager [phone] (M-F; 7a - 4p)  Please contact [facility] for night-coverage after hours (4p -7a ) and weekends on [email or web address]  Patient seen with NP, agree with the above note. Still with hematochezia.  He had 2 unit PRBCs yesterday, hgb 7.9 today.He had CTA 5/20 with IR, no active bleeding source noted so no intervention. Capsule endoscopy completed, pending result.  - Will transfuse 1 unit today with ongoing bleeding.    -Continue octreotide gtt. - Now on danazol. - We do not have many options here, I do not think we can do anything endoscopically (have tried here and at [facility]).Stools still suggest active bleeding. We have not been able to localize anything by IR. I talked with Dr. Donne Hazel with [facility] on 5/21. If we can identify a bleeding region, surgery may be an option. Last capsule on 4/29 showed too much blood apparently to identify the exact site of bleeding. Patient has finished repeat capsule endoscopy and await result.  We now have the CT from [name] that best-localized the bleeding when there.   We have him off all anticoagulation with active bleeding. There is certainly increased risk for pump thrombosis but we really have no other option at this point.LDH stable.  Left knee pain/swelling after PT, very hard for him to bear weight.  Will ask ortho to see, doubt this is gout.   Loralie Champagne 03/26/2018 9:03 AM

## 2018-03-26 NOTE — Progress Notes (Signed)
CARDIAC REHAB PHASE I   PRE:  Rate/Rhythm: 80 afib    BP: sitting 84    SaO2:   MODE:  Ambulation: 140 ft   POST:  Rate/Rhythm: 106 afib    BP: sitting 84     SaO2:   Pt eager to walk after fluid drained off knee. He sts it still hurts but has increased range of motion. He was able to stand with min assist and walk with RW. Slow at first but increased pace. He rested x2 against wall. To recliner. VSS. Pt happy to be able to walk.  [place], ACSM 03/26/2018 3:31 PM

## 2018-03-26 NOTE — Consult Note (Addendum)
Reason for Consult:Left knee pain Referring Physician: D Bensimhon  Trevor [name] is an 72 y.o. male.  HPI: Trevor [name] has been hospitalized for several weeks with a persistent GIB. About 3d ago he was at the sink in his room. As he turned to his right to go back to bed his left knee gave way and he fell to the floor. He was unable to bear weight on knee afterward. It swelled as well. Since then it's stayed about the same in terms of pain and swelling. Mild when he's resting but severe when he tries to walk on it. Denies prior hx/o similar sx. Denies hx/o gout or rheumatoid disease.   Past Medical History:  Diagnosis Date  . AICD (automatic cardioverter/defibrillator) present   . Asthma   . AVM (arteriovenous malformation) of colon 07/07/2017  . CHF (congestive heart failure) ([place])   . Diabetes ([place])   . ESRF (end stage renal failure) ([place]) 07/07/2017   pt receiving hemodialysis Monday- Wednesday-friday, sometimes Saturday  . Kidney disease   . LVAD (left ventricular assist device) present ([place]) 12/2016  . Permanent atrial fibrillation ([place]) 07/07/2017  . Presence of permanent cardiac pacemaker    AICD  . Sleep apnea     Past Surgical History:  Procedure Laterality Date  . AORTIC VALVE REPLACEMENT  2015   [place]  . APPLICATION OF A-CELL OF EXTREMITY Left 11/20/2017   Procedure: APPLICATION OF A-CELL;  Surgeon: Wallace Going, DO;  Location: [place];  Service: Plastics;  Laterality: Left;  . AV FISTULA PLACEMENT Left 06/30/2017   Procedure: CREATION of LEFT ARM Brachiocephalic Fistula;  Surgeon: Conrad Albion, MD;  Location: [place];  Service: Vascular;  Laterality: Left;  . CARDIAC CATHETERIZATION N/A 12/02/2016   Procedure: Right Heart Cath;  Surgeon: Larey Dresser, MD;  Location: [facility] [facility];  Service: Cardiovascular;  Laterality: N/A;  . COLONOSCOPY N/A 02/14/2017   Procedure: COLONOSCOPY;  Surgeon: Milus Banister, MD;  Location: [facility] [facility];  Service: Endoscopy;  Laterality:  N/A;  . COLONOSCOPY WITH PROPOFOL N/A 02/27/2018   Procedure: COLONOSCOPY WITH PROPOFOL;  Surgeon: Jerene Bears, MD;  Location: [facility];  Service: Gastroenterology;  Laterality: N/A;  . CORONARY ANGIOPLASTY WITH STENT PLACEMENT  2013   in [place]  . CORONARY ARTERY BYPASS GRAFT  2010   in [place]  . DIALYSIS/PERMA CATHETER INSERTION  06/30/2017   Procedure: INSERTION Dialysis Catheter;  Surgeon: Conrad Ahtanum, MD;  Location: [facility] OR;  Service: Vascular;;  . ESOPHAGOGASTRODUODENOSCOPY (EGD) WITH PROPOFOL N/A 02/27/2018   Procedure: ESOPHAGOGASTRODUODENOSCOPY (EGD) WITH PROPOFOL;  Surgeon: Jerene Bears, MD;  Location: [facility] ENDOSCOPY;  Service: Gastroenterology;  Laterality: N/A;  . GIVENS CAPSULE STUDY N/A 02/27/2018   Procedure: GIVENS CAPSULE STUDY;  Surgeon: Jerene Bears, MD;  Location: [facility];  Service: Gastroenterology;  Laterality: N/A;  . GIVENS CAPSULE STUDY N/A 03/25/2018   Procedure: GIVENS CAPSULE STUDY;  Surgeon: Jackquline Denmark, MD;  Location: [facility] ENDOSCOPY;  Service: Endoscopy;  Laterality: N/A;  . IABP INSERTION N/A 12/11/2016   Procedure: IABP Insertion;  Surgeon: Larey Dresser, MD;  Location: [place] [facility];  Service: Cardiovascular;  Laterality: N/A;  . INSERTION OF IMPLANTABLE LEFT VENTRICULAR ASSIST DEVICE N/A 12/13/2016   Procedure: INSERTION OF IMPLANTABLE LEFT VENTRICULAR ASSIST DEVICE;  Surgeon: Ivin Poot, MD;  Location: [place];  Service: Open Heart Surgery;  Laterality: N/A;  HEARTMATE II  NITRIC OXIDE  . IR FLUORO GUIDE CV LINE LEFT  06/17/2017  . IR US GUIDE VASC ACCESS LEFT  06/17/2017  . 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 RIGHT HEART CATH N/A 12/11/2016   Procedure: Right Heart Cath;  Surgeon: Larey Dresser, MD;  Location: [place] [facility];  Service: Cardiovascular;  Laterality: N/A;  . RIGHT HEART CATH N/A 03/27/2017   Procedure: Right Heart Cath;  Surgeon: Larey Dresser, MD;  Location: [place] [facility];  Service: Cardiovascular;  Laterality: N/A;  . RIGHT HEART CATH N/A 06/13/2017   Procedure: RIGHT HEART CATH;  Surgeon: Jolaine Artist, MD;  Location: [place] [facility];  Service: Cardiovascular;  Laterality: N/A;  . TEE WITHOUT CARDIOVERSION N/A 12/13/2016   Procedure: TRANSESOPHAGEAL ECHOCARDIOGRAM (TEE);  Surgeon: Ivin Poot, MD;  Location: [place];  Service: Open Heart Surgery;  Laterality: N/A;  . TRICUSPID VALVE REPLACEMENT N/A 12/13/2016   Procedure: TRICUSPID VALVE REPAIR WITH EDWARDS MC 3 TRICUSPID ANNULOPLASTY RING MODEL 4900 SIZE T 28;  Surgeon: Ivin Poot, MD;  Location: [place];  Service: Open Heart Surgery;  Laterality: N/A;    Family History  Problem Relation Age of Onset  . Heart failure Father   . Heart attack Father   . Healthy Mother   . Diabetes Paternal Grandfather     Social History:  reports that he has never smoked. He has never used smokeless tobacco. He reports that he does not drink alcohol or use drugs.  Allergies: No Known Allergies  Medications: I have reviewed the patient's current medications.  Results for orders placed or performed during the hospital encounter of 03/20/18 (from the past 48 hour(s))  Glucose, capillary     Status: Abnormal   Collection Time: 03/24/18 11:56 AM  Result Value Ref Range   Glucose-Capillary 157 (H) 65 - 99 mg/dL  Hemoglobin and hematocrit, blood     Status: Abnormal   Collection Time: 03/24/18  3:35 PM  Result Value Ref Range   Hemoglobin 7.4 (L) 13.0 - 17.0 g/dL   HCT 22.9 (L) 39.0 - 52.0 %    Comment: Performed at [facility], [place] [address] [place], [place] [address]  Glucose, capillary     Status:  Abnormal   Collection Time: 03/24/18  4:27 PM  Result Value Ref Range   Glucose-Capillary 130 (H) 65 - 99 mg/dL  Prepare RBC     Status: None   Collection Time: 03/24/18  5:19 PM  Result Value Ref Range   Order Confirmation      ORDER PROCESSED BY BLOOD BANK Performed at [facility], [place] [address] [place], [place] [address]   Glucose, capillary     Status: None   Collection Time: 03/24/18  9:19 PM  Result Value Ref Range   Glucose-Capillary 68 65 - 99 mg/dL  Glucose, capillary     Status: Abnormal   Collection Time: 03/24/18 11:54 PM  Result Value Ref Range   Glucose-Capillary 64 (L) 65 - 99 mg/dL  Glucose, capillary     Status: None   Collection Time: 03/25/18 12:24 AM  Result Value Ref Range   Glucose-Capillary 67 65 -  99 mg/dL  Glucose, capillary     Status: Abnormal   Collection Time: 03/25/18  1:13 AM  Result Value Ref Range   Glucose-Capillary 151 (H) 65 - 99 mg/dL  Lactate dehydrogenase     Status: Abnormal   Collection Time: 03/25/18  2:21 AM  Result Value Ref Range   LDH 206 (H) 98 - 192 U/L    Comment: Performed at [name], [place] [address] [place], [place] [address]  Protime-INR     Status: None   Collection Time: 03/25/18  2:21 AM  Result Value Ref Range   Prothrombin Time 14.6 11.4 - 15.2 seconds   INR 1.15     Comment: Performed at [facility] [address], [place], [place] [address]  CBC     Status: Abnormal   Collection Time: 03/25/18  2:21 AM  Result Value Ref Range   WBC 9.5 4.0 - 10.5 K/uL   RBC 2.75 (L) 4.22 - 5.81 MIL/uL   Hemoglobin 8.1 (L) 13.0 - 17.0 g/dL   HCT 24.8 (L) 39.0 - 52.0 %   MCV 90.2 78.0 - 100.0 fL   MCH 29.5 26.0 - 34.0 pg   MCHC 32.7 30.0 - 36.0 g/dL   RDW 17.2 (H) 11.5 - 15.5 %   Platelets 149 (L) 150 - 400 K/uL    Comment: Performed at [facility], [place] [address] [place], [place] [address]  Basic metabolic panel     Status: Abnormal   Collection Time: 03/25/18  2:21 AM  Result Value Ref  Range   Sodium 135 135 - 145 mmol/L   Potassium 3.7 3.5 - 5.1 mmol/L   Chloride 99 (L) 101 - 111 mmol/L   CO2 29 22 - 32 mmol/L   Glucose, Bld 141 (H) 65 - 99 mg/dL   BUN 22 (H) 6 - 20 mg/dL   Creatinine, Ser 3.51 (H) 0.61 - 1.24 mg/dL    Comment: DELTA CHECK NOTED   Calcium 7.1 (L) 8.9 - 10.3 mg/dL   GFR calc non Af Amer 16 (L) >60 mL/min   GFR calc Af Amer 19 (L) >60 mL/min    Comment: (NOTE) The eGFR has been calculated using the CKD EPI equation. This calculation has not been validated in all clinical situations. eGFR's persistently <60 mL/min signify possible Chronic Kidney Disease.    Anion gap 7 5 - 15    Comment: Performed at [facility] [address], [place], [place] [address]  Magnesium     Status: None   Collection Time: 03/25/18  2:21 AM  Result Value Ref Range   Magnesium 1.8 1.7 - 2.4 mg/dL    Comment: Performed at [facility] [address] [place], [place] [address]  Uric acid     Status: Abnormal   Collection Time: 03/25/18  2:21 AM  Result Value Ref Range   Uric Acid, Serum 3.3 (L) 4.4 - 7.6 mg/dL    Comment: Performed at [facility] [address], [place],  [address]  Glucose, capillary     Status: None   Collection Time: 03/25/18  7:52 AM  Result Value Ref Range   Glucose-Capillary 80 65 - 99 mg/dL   Comment 1 Notify RN    Comment 2 Document in Chart   Prepare RBC     Status: None   Collection Time: 03/25/18  8:39 AM  Result Value Ref Range   Order Confirmation  ORDER PROCESSED BY BLOOD BANK Performed at [facility], [place] [address], [place], [place] [address]   Glucose, capillary     Status: Abnormal   Collection Time: 03/25/18 12:38 PM  Result Value Ref Range   Glucose-Capillary 119 (H) 65 - 99 mg/dL  Glucose, capillary     Status: Abnormal   Collection Time: 03/25/18  5:33 PM  Result Value Ref Range   Glucose-Capillary 168 (H) 65 - 99 mg/dL   Comment 1 Notify RN    Comment 2 Document in Chart    Hemoglobin and hematocrit, blood     Status: Abnormal   Collection Time: 03/25/18  7:38 PM  Result Value Ref Range   Hemoglobin 9.8 (L) 13.0 - 17.0 g/dL   HCT 29.3 (L) 39.0 - 52.0 %    Comment: Performed at [facility] [address] [place], [place] [address]  Glucose, capillary     Status: Abnormal   Collection Time: 03/25/18  9:14 PM  Result Value Ref Range   Glucose-Capillary 268 (H) 65 - 99 mg/dL  Lactate dehydrogenase     Status: None   Collection Time: 03/26/18  2:58 AM  Result Value Ref Range   LDH 185 98 - 192 U/L    Comment: Performed at [facility], [place] [address], [place], [place] [address]  Protime-INR     Status: Abnormal   Collection Time: 03/26/18  2:58 AM  Result Value Ref Range   Prothrombin Time 15.4 (H) 11.4 - 15.2 seconds   INR 1.23     Comment: Performed at [facility] [address] [place], [place] [address]  CBC     Status: Abnormal   Collection Time: 03/26/18  2:58 AM  Result Value Ref Range   WBC 7.9 4.0 - 10.5 K/uL   RBC 2.71 (L) 4.22 - 5.81 MIL/uL   Hemoglobin 7.9 (L) 13.0 - 17.0 g/dL   HCT 24.1 (L) 39.0 - 52.0 %   MCV 88.9 78.0 - 100.0 fL   MCH 29.2 26.0 - 34.0 pg   MCHC 32.8 30.0 - 36.0 g/dL   RDW 16.4 (H) 11.5 - 15.5 %   Platelets 120 (L) 150 - 400 K/uL    Comment: Performed at [facility], [place] [address] [place], [place] [address]  Magnesium     Status: None   Collection Time: 03/26/18  2:58 AM  Result Value Ref Range   Magnesium 1.9 1.7 - 2.4 mg/dL    Comment: Performed at [facility], [place] [address], [place], [place] [address]  Renal function panel     Status: Abnormal   Collection Time: 03/26/18  2:58 AM  Result Value Ref Range   Sodium 136 135 - 145 mmol/L   Potassium 4.0 3.5 - 5.1 mmol/L   Chloride 101 101 - 111 mmol/L   CO2 29 22 - 32 mmol/L   Glucose, Bld 222 (H) 65 - 99 mg/dL   BUN 14 6 - 20 mg/dL   Creatinine, Ser 3.04 (H) 0.61 - 1.24 mg/dL   Calcium 7.1 (L) 8.9 - 10.3 mg/dL    Phosphorus 2.2 (L) 2.5 - 4.6 mg/dL   Albumin 2.2 (L) 3.5 - 5.0 g/dL   GFR calc non Af Amer 19 (L) >60 mL/min   GFR calc Af Amer 22 (L) >60 mL/min    Comment: (NOTE) The eGFR has been calculated using the CKD EPI equation. This calculation has not been validated  in all clinical situations. eGFR's persistently <60 mL/min signify possible Chronic Kidney Disease.    Anion gap 6 5 - 15    Comment: Performed at [facility] [address], [place], [place] [address]  Prepare RBC     Status: None   Collection Time: 03/26/18  8:24 AM  Result Value Ref Range   Order Confirmation      ORDER PROCESSED BY BLOOD BANK Performed at [facility], [place] [address] [place], [place] [address]   Glucose, capillary     Status: Abnormal   Collection Time: 03/26/18  8:31 AM  Result Value Ref Range   Glucose-Capillary 136 (H) 65 - 99 mg/dL   Comment 1 Notify RN    Comment 2 Document in Chart     Dg Knee Left Port  Result Date: 03/26/2018 CLINICAL DATA:  Left knee pain and swelling. EXAM: PORTABLE LEFT KNEE - 1-2 VIEW COMPARISON:  No recent prior. FINDINGS: Prominent knee joint effusion. Diffuse tricompartment degenerative change left knee. Loose bodies may be present. No acute bony or joint abnormality identified. No evidence of fracture dislocation. Peripheral vascular calcification noted. Surgical clip noted over the medial soft tissues. IMPRESSION: 1. Prominent knee joint effusion. Tricompartment degenerative change left knee. Loose bodies may be present. No acute bony abnormality identified. 2.  Peripheral vascular disease. Electronically Signed   By: Marcello Moores  Register   On: 03/26/2018 10:11    Review of Systems  Constitutional: Negative for weight loss.  HENT: Negative for ear discharge, ear pain, hearing loss and tinnitus.   Eyes: Negative for blurred vision, double vision, photophobia and pain.  Respiratory: Negative for cough, sputum production and shortness of breath.    Cardiovascular: Negative for chest pain.  Gastrointestinal: Negative for abdominal pain, nausea and vomiting.  Genitourinary: Negative for dysuria, flank pain, frequency and urgency.  Musculoskeletal: Positive for joint pain (Left knee). Negative for back pain, falls, myalgias and neck pain.  Neurological: Negative for dizziness, tingling, sensory change, focal weakness, loss of consciousness and headaches.  Endo/Heme/Allergies: Does not bruise/bleed easily.  Psychiatric/Behavioral: Negative for depression, memory loss and substance abuse. The patient is not nervous/anxious.    Blood pressure (!) 80/56, pulse 94, temperature 97.8 F (36.6 C), temperature source Oral, resp. rate 16, height _0  (1.651 m), weight 87.6 kg (193 lb 2 oz), SpO2 93 %. Physical Exam  Constitutional: He appears well-developed and well-nourished. No distress.  HENT:  Head: Normocephalic and atraumatic.  Eyes: Conjunctivae are normal. Right eye exhibits no discharge. Left eye exhibits no discharge. No scleral icterus.  Neck: Normal range of motion.  Cardiovascular: Normal rate and regular rhythm.  Respiratory: Effort normal. No respiratory distress.  Musculoskeletal:  LLE No traumatic wounds, ecchymosis, or rash  Knee diffusely TTP, mild, lateral and suprapatellar worst. Moderate efffusion. Cannot SLR. Medial pain with valgus stress, minimal to no pain with varus stress. Negative drawer.  No ankle effusion  Sens DPN, SPN, TN intact  Motor EHL, ext, flex, evers 5/5  DP 1+, PT 0+, 2+ pitting edema  Neurological: He is alert.  Skin: Skin is warm and dry. He is not diaphoretic.  Psychiatric: He has a normal mood and affect. His behavior is normal.    Assessment/Plan: Left knee pain -- I suspect that the patient has a meniscal injury of his L knee. Would recommend MRI to confirm diagnosis but his LVAD prohibits that. Will order a hinged knee brace for support. Given that we cannot prescribe NSAID's  given his GIB we  are left with knee aspiration/injection to try and alleviate his symptoms. Will instill Marcaine +/- depomedrol depending on appearance of fluid. Doubt gout, septic, or reactive arthritis but will send fluid for analysis just to be sure. He may WBAT in knee brace and may have brace off when not weight bearing for comfort. He should f/u with Dr. Doran Durand as OP once discharged.    Lisette Abu, PA-C Orthopedic Surgery [phone] 03/26/2018, 10:42 AM   I have examined the patient and taken his history.  I agree with the note as documented above.  His knee aspirate showed no crystals.  White count was less than 10,000.  No organisms were seen.  I believe this represents a reactive effusion likely related to his tricompartmental degenerative changes.  Alternatively he could have a meniscal tear.  Ligament injury is unlikely given his mechanism and physical exam findings.  At this point we will see how he does with the injection.  I will plan to see him back in the office on an as-needed basis.  He can wear the brace as needed for stability while ambulating in the hospital.

## 2018-03-26 NOTE — Care Management Note (Signed)
Case Management Note  Patient Details  Name: MOE GRACA MRN: [id number] Date of Birth: 04-30-1946  Subjective/Objective:       Pt admitted from [facility] with continuous GI bleed             Action/Plan:  PTA independent from home, on outpt HD.  Pt has PCP and current prescription insurance.  CM will continue to follow   Expected Discharge Date:                  Expected Discharge Plan:  [place]  In-House Referral:     Discharge planning Services  CM Consult  Post Acute Care Choice:    Choice offered to:     DME Arranged:    DME Agency:     HH Arranged:    [place] Agency:     Status of Service:  In process, will continue to follow  If discussed at Long Length of Stay Meetings, dates discussed:    Additional Comments: 03/26/2018  Pt continues to bleed - family meeting scheduled 5/24 [name], RN 03/26/2018, 4:08 PM

## 2018-03-26 NOTE — Consult Note (Addendum)
Reason for Consult: GI bleed of uncertain etiology Referring Physician: Darden Dates is an 72 y.o. male.   HPI: Patient is extremely complicated 72 year old male with a history of CAD and coronary bypass grafting x4; 2010.  Aortic stenosis with TAVR 2015, ischemic cardiomyopathy with [employer] ICD, status post HMII LVAD, 12/14/2015.  End-stage renal disease on hemodialysis, chronic atrial fibrillation on Coumadin,  type II diabetes, obstructive sleep apnea.  He is readmitted on 03/09/2018, with recurrent GI bleed.  His last [facility] admission was 4/22 -03/05/2018 with GI bleed.  Hemoglobin 7.7 he received total of 5 units of packed cells during that hospitalization.  He is undergone capsule endoscopy 02/27/2018 which showed active bleeding but no source found.  It was thought to be at the cecum.  Patient underwent a capsule endoscopy by Dr. Hilarie Fredrickson on 03/02/2018.  This showed gastric passage and 42 minutes, and small bowel passage in 8 hours and 56 minutes.  Findings included active recent bleeding with fresh blood in the ileum beginning at 6-hour 58-minute mark.  About 70% of the way through the small bowel.  No source was seen due to the bleeding.  There were several very small angioectasia in the jejunum which were not bleeding.  Colonoscopy 02/14/17 by Dr. Ardis Hughs showed fresh blood and clots throughout the colon no blood in the terminal ileum one small pinpoint angiodysplastic lesion with active bleeding was found in the hepatic flexure this was treated with coagulation using argon plasma and the bleeding stopped.  2 clips were placed at the site to bury the AVM within the mucosa and any potential tamponade to prevent further bleeding.  Also 3 polyps found in the sigmoid colon and descending colon there were also multiple small mouth diverticula found in the left colon.    Patient was next seen at [facility] on 5/7 - 5/17//2019, he was continued to have bright red blood  per rectum with every stool after discharge.  He was supposed to start danazol at discharge from [facility] but did not.  He did start octreotide and doxycycline as recommended.  Patient reports normally bloody stools but feeling sleepy and lying around the house most of the time.  Later followed by shortness of breath was some associated lightheadedness.He received 2 additional units of blood and referred for retrograde double balloon endoscopy at [facility] which was done on 03/12/2018.  This also showed active bleeding and again no source was found.  IR was consulted for provocative arteriogram which again revealed no source of bleeding.  CT occult study was then obtained which showed right lower quadrant bowel bleeding with diffuse hyperdensity in the colon.  Patient was sent to IR again and bleeding source could not be found.  General surgery was consulted and the recommendation was to whole bowel colectomy and it was noted this would be very high risk.  Patient refused this recommendation he was seen again by GI and IR. It was also his hope this could be managed medically without surgery as described.     A repeat provocative arteriogram was recommended and this was again negative.  He was discharged on danazol and monthly octreotide injections and transferred back to [facility] for further treatment/management.   He was admitted back on  03/20/2018 by Dr. Tempie Hoist with symptomatic anemia after being  transferred back from [facility].   He continues to feels weak he had a total of 12 units of packed cells  while he was at [facility].  He has another capsule study in progress which can be completed today. He continues to bleed and was transfused this AM again.   He was last seen by our service on 03/21/18 by Dr. Georganna Skeans.  At that point it was his recommendation that the only surgical option would be a combined double balloon enteroscopy and a surgical exploration combined,  which we do not have the capabilities to do here.  It would have to be done at [facility].  This capsule study was just read and shows ongoing bleeding, but again a source is not available.   Because of the ongoing bleeding we are asked to see.   Currently he is afebrile blood pressure is in the 80s and 90s range.  Hemoglobin is 7.9, hematocrit 24, platelets 120,000.  Electrolytes are stable.  He is also just undergone aspiration of the left knee effusion with Depo-Medrol injection, by orthopedics earlier today.   Past Medical History:  Diagnosis Date  . AICD (automatic cardioverter/defibrillator) present   . Asthma   . AVM (arteriovenous malformation) of colon 07/07/2017  . CHF (congestive heart failure) ([place])   . Diabetes ([place])   . ESRF (end stage renal failure) ([place]) 07/07/2017   pt receiving hemodialysis Monday- Wednesday-friday, sometimes Saturday  . Kidney disease   . LVAD (left ventricular assist device) present ([place]) 12/2016  . Permanent atrial fibrillation ([place]) 07/07/2017  . Presence of permanent cardiac pacemaker    AICD  . Sleep apnea     Past Surgical History:  Procedure Laterality Date  . AORTIC VALVE REPLACEMENT  2015   [place]  . APPLICATION OF A-CELL OF EXTREMITY Left 11/20/2017   Procedure: APPLICATION OF A-CELL;  Surgeon: Wallace Going, DO;  Location: [facility];  Service: Plastics;  Laterality: Left;  . AV FISTULA PLACEMENT Left 06/30/2017   Procedure: CREATION of LEFT ARM Brachiocephalic Fistula;  Surgeon: Conrad Forsyth, MD;  Location: [facility];  Service: Vascular;  Laterality: Left;  . CARDIAC CATHETERIZATION N/A 12/02/2016   Procedure: Right Heart Cath;  Surgeon: Larey Dresser, MD;  Location: [place] CV LAB;  Service: Cardiovascular;  Laterality: N/A;  . COLONOSCOPY N/A 02/14/2017   Procedure: COLONOSCOPY;  Surgeon: Milus Banister, MD;  Location: [place];  Service: Endoscopy;  Laterality: N/A;  . COLONOSCOPY WITH PROPOFOL N/A 02/27/2018    Procedure: COLONOSCOPY WITH PROPOFOL;  Surgeon: Jerene Bears, MD;  Location: [place];  Service: Gastroenterology;  Laterality: N/A;  . CORONARY ANGIOPLASTY WITH STENT PLACEMENT  2013   in [place]  . CORONARY ARTERY BYPASS GRAFT  2010   in [place]  . DIALYSIS/PERMA CATHETER INSERTION  06/30/2017   Procedure: INSERTION Dialysis Catheter;  Surgeon: Conrad [place], MD;  Location: [facility] OR;  Service: Vascular;;  . ESOPHAGOGASTRODUODENOSCOPY (EGD) WITH PROPOFOL N/A 02/27/2018   Procedure: ESOPHAGOGASTRODUODENOSCOPY (EGD) WITH PROPOFOL;  Surgeon: Jerene Bears, MD;  Location: [facility] ENDOSCOPY;  Service: Gastroenterology;  Laterality: N/A;  . GIVENS CAPSULE STUDY N/A 02/27/2018   Procedure: GIVENS CAPSULE STUDY;  Surgeon: Jerene Bears, MD;  Location: [place];  Service: Gastroenterology;  Laterality: N/A;  . GIVENS CAPSULE STUDY N/A 03/25/2018   Procedure: GIVENS CAPSULE STUDY;  Surgeon: Jackquline Denmark, MD;  Location: [facility] ENDOSCOPY;  Service: Endoscopy;  Laterality: N/A;  . IABP INSERTION N/A 12/11/2016   Procedure: IABP Insertion;  Surgeon: Larey Dresser, MD;  Location: [place] [facility] LAB;  Service: Cardiovascular;  Laterality:  N/A;  . INSERTION OF IMPLANTABLE LEFT VENTRICULAR ASSIST DEVICE N/A 12/13/2016   Procedure: INSERTION OF IMPLANTABLE LEFT VENTRICULAR ASSIST DEVICE;  Surgeon: Ivin Poot, MD;  Location: [place];  Service: Open Heart Surgery;  Laterality: N/A;  HEARTMATE II  NITRIC OXIDE  . IR FLUORO GUIDE CV LINE LEFT  06/17/2017  . IR US GUIDE VASC ACCESS LEFT  06/17/2017  . MASS EXCISION Left 11/20/2017   Procedure: EXCISION OF LEFT NECK AND RIGHT LEG SKIN CANCER WITH A CELL PLACEMENT;  Surgeon: Wallace Going, DO;  Location: [place];  Service: Plastics;  Laterality: Left;  Marland Kitchen MASS EXCISION Left 11/26/2017   Procedure: EXPLORATION AND EXCISION OF LEFT NECK;  Surgeon: Wallace Going, DO;  Location: [place];  Service: Plastics;  Laterality: Left;  . RIGHT HEART CATH N/A 12/11/2016    Procedure: Right Heart Cath;  Surgeon: Larey Dresser, MD;  Location: [place] CV LAB;  Service: Cardiovascular;  Laterality: N/A;  . RIGHT HEART CATH N/A 03/27/2017   Procedure: Right Heart Cath;  Surgeon: Larey Dresser, MD;  Location: [place] [facility];  Service: Cardiovascular;  Laterality: N/A;  . RIGHT HEART CATH N/A 06/13/2017   Procedure: RIGHT HEART CATH;  Surgeon: J[place]aine Artist, MD;  Location: [place] [facility];  Service: Cardiovascular;  Laterality: N/A;  . TEE WITHOUT CARDIOVERSION N/A 12/13/2016   Procedure: TRANSESOPHAGEAL ECHOCARDIOGRAM (TEE);  Surgeon: Ivin Poot, MD;  Location: [place];  Service: Open Heart Surgery;  Laterality: N/A;  . TRICUSPID VALVE REPLACEMENT N/A 12/13/2016   Procedure: TRICUSPID VALVE REPAIR WITH EDWARDS MC 3 TRICUSPID ANNULOPLASTY RING MODEL 4900 SIZE T 28;  Surgeon: Ivin Poot, MD;  Location: [place];  Service: Open Heart Surgery;  Laterality: N/A;    Family History  Problem Relation Age of Onset  . Heart failure Father   . Heart attack Father   . Healthy Mother   . Diabetes Paternal Grandfather     Social History:  reports that he has never smoked. He has never used smokeless tobacco. He reports that he does not drink alcohol or use drugs.  Allergies: No Known Allergies  Medications:  Prior to Admission:  Medications Prior to Admission  Medication Sig Dispense Refill Last Dose  . acetaminophen (TYLENOL) 325 MG tablet Take 2 tablets (650 mg total) by mouth every 4 (four) hours as needed for headache or mild pain.   Past Month at Unknown time  . albuterol (PROVENTIL HFA;VENTOLIN HFA) 108 (90 Base) MCG/ACT inhaler Inhale 2 puffs into the lungs every 4 (four) hours as needed for wheezing or shortness of breath. 3 Inhaler 5 Past Month at Unknown time  . atorvastatin (LIPITOR) 40 MG tablet TAKE 1 TABLET(40 MG) BY MOUTH DAILY (Patient taking differently: Take 40 mg by mouth daily at 6 PM. ) 30 tablet 11 Past Month at Unknown time  .  atorvastatin (LIPITOR) 40 MG tablet Take 1 tablet (40 mg total) by mouth daily at 6 PM. 90 tablet 3 Past Month at Unknown time  . busPIRone (BUSPAR) 5 MG tablet Take 1 tablet (5 mg total) by mouth 2 (two) times daily. 180 tablet 3 Past Month at Unknown time  . calcitRIOL (ROCALTROL) 0.5 MCG capsule Take 1 capsule (0.5 mcg total) by mouth every Monday, Wednesday, and Friday with hemodialysis.   Past Month at Unknown time  . danazol (DANOCRINE) 100 MG capsule Take 1 capsule (100 mg total) by mouth 2 (two) times daily. 60 capsule 5  Past Month at Unknown time  . docusate sodium (COLACE) 100 MG capsule Take 1 capsule (100 mg total) by mouth 2 (two) times daily. (Patient taking differently: Take 100 mg daily by mouth. ) 60 capsule 12 Past Month at Unknown time  . doxycycline (VIBRA-TABS) 100 MG tablet Take 1 tablet (100 mg total) by mouth every 12 (twelve) hours. 60 tablet 6 Past Month at Unknown time  . epoetin alfa (EPOGEN) 4000 UNIT/ML injection Inject 3,600 Units into the vein See admin instructions. For dialysis     . ferric citrate (AURYXIA) 1 GM 210 MG(Fe) tablet Take 420 mg by mouth 3 (three) times daily with meals.   Past Month at Unknown time  . fluticasone (FLOVENT HFA) 110 MCG/ACT inhaler Inhale 2 puffs into the lungs 2 (two) times daily. 3 Inhaler 5 Past Month at Unknown time  . gabapentin (NEURONTIN) 600 MG tablet Take 0.5 tablets (300 mg total) by mouth 2 (two) times daily.   Past Month at Unknown time  . insulin glargine (LANTUS) 100 unit/mL SOPN Inject 0.15 mLs (15 Units total) into the skin at bedtime. 1 pen 1 Past Month at Unknown time  . levothyroxine (SYNTHROID, LEVOTHROID) 25 MCG tablet Take 1 tablet (25 mcg total) by mouth daily before breakfast. 90 tablet 3 Past Month at Unknown time  . magnesium oxide (MAG-OX) 400 MG tablet Take 400 mg by mouth daily.   Past Month at Unknown time  . multivitamin (RENA-VIT) TABS tablet Take 1 tablet by mouth at bedtime. 30 tablet 6 Past Month at  Unknown time  . pantoprazole (PROTONIX) 40 MG tablet Take 1 tablet (40 mg total) by mouth daily. 30 tablet 5 Past Month at Unknown time  . sevelamer carbonate (RENVELA) 800 MG tablet Take 1,600-2,400 mg by mouth See admin instructions. Take 2,400 mg by mouth three times a day with meals and 1,600 mg two times a day with snacks  3 Past Month at Unknown time  . sildenafil (REVATIO) 20 MG tablet Take 2 tablets (40 mg total) 3 (three) times daily by mouth. (Patient taking differently: Take 40 mg by mouth See admin instructions. Take two tablets (40 mg) by mouth three times a day- midday, afternoon, and bedtime) 180 tablet 6 Past Month at Unknown time  . traMADol (ULTRAM) 50 MG tablet Take 1 tablet (50 mg total) by mouth every 12 (twelve) hours as needed. TAKE 1 TABLET BY MOUTH EVERY 6 HOURS AS NEEDED FOR MODERATE PAIN (Patient taking differently: Take 50 mg by mouth every 6 (six) hours as needed for moderate pain. ) 60 tablet 3 Past Month at Unknown time  . traZODone (DESYREL) 100 MG tablet TAKE 1 TABLET BY MOUTH EVERY NIGHT AT BEDTIME AS NEEDED FOR SLEEP (Patient taking differently: TAKE 1 TABLET BY MOUTH EVERY NIGHT AT BEDTIME) 30 tablet 0 Past Month at Unknown time   Scheduled: . atorvastatin  40 mg Oral q1800  . busPIRone  5 mg Oral BID  . calcitRIOL  0.5 mcg Oral Q M,W,F-HD  . Chlorhexidine Gluconate Cloth  6 each Topical Q0600  . danazol  100 mg Oral BID  . darbepoetin (ARANESP) injection - DIALYSIS  100 mcg Intravenous Q Mon-HD  . gabapentin  300 mg Oral BID  . insulin aspart  0-9 Units Subcutaneous TID WC  . insulin glargine  8 Units Subcutaneous QHS  . levothyroxine  25 mcg Oral QAC breakfast  . magnesium oxide  400 mg Oral Daily  . mometasone-formoterol  2 puff Inhalation BID  .  multivitamin  1 tablet Oral QHS  . pantoprazole  40 mg Oral Daily  . sildenafil  40 mg Oral TID   Continuous: . octreotide  (SANDOSTATIN)    IV infusion 50 mcg/hr (03/26/18 1308)   Anti-infectives (From  admission, onward)   Start     Dose/Rate Route Frequency Ordered Stop   03/21/18 1000  doxycycline (VIBRA-TABS) tablet 100 mg  Status:  Discontinued     100 mg Oral Every 12 hours 03/21/18 0621 03/21/18 0630      Results for orders placed or performed during the hospital encounter of 03/20/18 (from the past 48 hour(s))  Hemoglobin and hematocrit, blood     Status: Abnormal   Collection Time: 03/24/18  3:35 PM  Result Value Ref Range   Hemoglobin 7.4 (L) 13.0 - 17.0 g/dL   HCT 22.9 (L) 39.0 - 52.0 %    Comment: Performed at [facility], [place] [address] [place], [place] [address]  Glucose, capillary     Status: Abnormal   Collection Time: 03/24/18  4:27 PM  Result Value Ref Range   Glucose-Capillary 130 (H) 65 - 99 mg/dL  Prepare RBC     Status: None   Collection Time: 03/24/18  5:19 PM  Result Value Ref Range   Order Confirmation      ORDER PROCESSED BY BLOOD BANK Performed at [facility], [place] [address] [place], [place] [address]   Glucose, capillary     Status: None   Collection Time: 03/24/18  9:19 PM  Result Value Ref Range   Glucose-Capillary 68 65 - 99 mg/dL  Glucose, capillary     Status: Abnormal   Collection Time: 03/24/18 11:54 PM  Result Value Ref Range   Glucose-Capillary 64 (L) 65 - 99 mg/dL  Glucose, capillary     Status: None   Collection Time: 03/25/18 12:24 AM  Result Value Ref Range   Glucose-Capillary 67 65 - 99 mg/dL  Glucose, capillary     Status: Abnormal   Collection Time: 03/25/18  1:13 AM  Result Value Ref Range   Glucose-Capillary 151 (H) 65 - 99 mg/dL  Lactate dehydrogenase     Status: Abnormal   Collection Time: 03/25/18  2:21 AM  Result Value Ref Range   LDH 206 (H) 98 - 192 U/L    Comment: Performed at [facility], [place] [address] [place], [place] [address]  Protime-INR     Status: None   Collection Time: 03/25/18  2:21 AM  Result Value Ref Range   Prothrombin Time 14.6 11.4 - 15.2 seconds   INR 1.15     Comment:  Performed at [facility] [address] [place], [place] [address]  CBC     Status: Abnormal   Collection Time: 03/25/18  2:21 AM  Result Value Ref Range   WBC 9.5 4.0 - 10.5 K/uL   RBC 2.75 (L) 4.22 - 5.81 MIL/uL   Hemoglobin 8.1 (L) 13.0 - 17.0 g/dL   HCT 24.8 (L) 39.0 - 52.0 %   MCV 90.2 78.0 - 100.0 fL   MCH 29.5 26.0 - 34.0 pg   MCHC 32.7 30.0 - 36.0 g/dL   RDW 17.2 (H) 11.5 - 15.5 %   Platelets 149 (L) 150 - 400 K/uL    Comment: Performed at [facility], [place] [address], [place], [place] [address]  Basic metabolic panel     Status: Abnormal   Collection Time: 03/25/18  2:21  AM  Result Value Ref Range   Sodium 135 135 - 145 mmol/L   Potassium 3.7 3.5 - 5.1 mmol/L   Chloride 99 (L) 101 - 111 mmol/L   CO2 29 22 - 32 mmol/L   Glucose, Bld 141 (H) 65 - 99 mg/dL   BUN 22 (H) 6 - 20 mg/dL   Creatinine, Ser 3.51 (H) 0.61 - 1.24 mg/dL    Comment: DELTA CHECK NOTED   Calcium 7.1 (L) 8.9 - 10.3 mg/dL   GFR calc non Af Amer 16 (L) >60 mL/min   GFR calc Af Amer 19 (L) >60 mL/min    Comment: (NOTE) The eGFR has been calculated using the CKD EPI equation. This calculation has not been validated in all clinical situations. eGFR's persistently <60 mL/min signify possible Chronic Kidney Disease.    Anion gap 7 5 - 15    Comment: Performed at [facility] [address] [place], [place]  [address]  Magnesium     Status: None   Collection Time: 03/25/18  2:21 AM  Result Value Ref Range   Magnesium 1.8 1.7 - 2.4 mg/dL    Comment: Performed at [facility] [address] [place], [place] [address]  Uric acid     Status: Abnormal   Collection Time: 03/25/18  2:21 AM  Result Value Ref Range   Uric Acid, Serum 3.3 (L) 4.4 - 7.6 mg/dL    Comment: Performed at [facility] [address] [place], [place] [address]  Glucose, capillary     Status: None   Collection Time: 03/25/18  7:52 AM  Result Value Ref Range   Glucose-Capillary 80 65 - 99 mg/dL    Comment 1 Notify RN    Comment 2 Document in Chart   Prepare RBC     Status: None   Collection Time: 03/25/18  8:39 AM  Result Value Ref Range   Order Confirmation      ORDER PROCESSED BY BLOOD BANK Performed at [facility], [place] [address] [place], [place] [address]   Glucose, capillary     Status: Abnormal   Collection Time: 03/25/18 12:38 PM  Result Value Ref Range   Glucose-Capillary 119 (H) 65 - 99 mg/dL  Glucose, capillary     Status: Abnormal   Collection Time: 03/25/18  5:33 PM  Result Value Ref Range   Glucose-Capillary 168 (H) 65 - 99 mg/dL   Comment 1 Notify RN    Comment 2 Document in Chart   Hemoglobin and hematocrit, blood     Status: Abnormal   Collection Time: 03/25/18  7:38 PM  Result Value Ref Range   Hemoglobin 9.8 (L) 13.0 - 17.0 g/dL   HCT 29.3 (L) 39.0 - 52.0 %    Comment: Performed at [facility] [address] [place], [place] [address]  Glucose, capillary     Status: Abnormal   Collection Time: 03/25/18  9:14 PM  Result Value Ref Range   Glucose-Capillary 268 (H) 65 - 99 mg/dL  Lactate dehydrogenase     Status: None   Collection Time: 03/26/18  2:58 AM  Result Value Ref Range   LDH 185 98 - 192 U/L    Comment: Performed at [facility], [place] [address] [place],  [address]  Protime-INR     Status: Abnormal   Collection Time: 03/26/18  2:58 AM  Result Value Ref Range   Prothrombin Time 15.4 (H) 11.4 - 15.2  seconds   INR 1.23     Comment: Performed at [facility], [place] [address], [place], [place] [address]  CBC     Status: Abnormal   Collection Time: 03/26/18  2:58 AM  Result Value Ref Range   WBC 7.9 4.0 - 10.5 K/uL   RBC 2.71 (L) 4.22 - 5.81 MIL/uL   Hemoglobin 7.9 (L) 13.0 - 17.0 g/dL   HCT 24.1 (L) 39.0 - 52.0 %   MCV 88.9 78.0 - 100.0 fL   MCH 29.2 26.0 - 34.0 pg   MCHC 32.8 30.0 - 36.0 g/dL   RDW 16.4 (H) 11.5 - 15.5 %   Platelets 120 (L) 150 - 400 K/uL    Comment: Performed at [facility],  [place] [address], [place], [place] [address]  Magnesium     Status: None   Collection Time: 03/26/18  2:58 AM  Result Value Ref Range   Magnesium 1.9 1.7 - 2.4 mg/dL    Comment: Performed at [facility], [place] [address] [place], [place] [address]  Renal function panel     Status: Abnormal   Collection Time: 03/26/18  2:58 AM  Result Value Ref Range   Sodium 136 135 - 145 mmol/L   Potassium 4.0 3.5 - 5.1 mmol/L   Chloride 101 101 - 111 mmol/L   CO2 29 22 - 32 mmol/L   Glucose, Bld 222 (H) 65 - 99 mg/dL   BUN 14 6 - 20 mg/dL   Creatinine, Ser 3.04 (H) 0.61 - 1.24 mg/dL   Calcium 7.1 (L) 8.9 - 10.3 mg/dL   Phosphorus 2.2 (L) 2.5 - 4.6 mg/dL   Albumin 2.2 (L) 3.5 - 5.0 g/dL   GFR calc non Af Amer 19 (L) >60 mL/min   GFR calc Af Amer 22 (L) >60 mL/min    Comment: (NOTE) The eGFR has been calculated using the CKD EPI equation. This calculation has not been validated in all clinical situations. eGFR's persistently <60 mL/min signify possible Chronic Kidney Disease.    Anion gap 6 5 - 15    Comment: Performed at [facility] [address], [place], [place] [address]  Prepare RBC     Status: None   Collection Time: 03/26/18  8:24 AM  Result Value Ref Range   Order Confirmation      ORDER PROCESSED BY BLOOD BANK Performed at [facility], [place] [address], [place], [place] [address]   Glucose, capillary     Status: Abnormal   Collection Time: 03/26/18  8:31 AM  Result Value Ref Range   Glucose-Capillary 136 (H) 65 - 99 mg/dL   Comment 1 Notify RN    Comment 2 Document in Chart   Glucose, capillary     Status: Abnormal   Collection Time: 03/26/18 12:32 PM  Result Value Ref Range   Glucose-Capillary 188 (H) 65 - 99 mg/dL  Synovial cell count + diff, w/ crystals     Status: Abnormal (Preliminary result)   Collection Time: 03/26/18  1:35 PM  Result Value Ref Range   Color, Synovial YELLOW YELLOW   Appearance-Synovial TURBID (A) CLEAR   Crystals, Fluid PENDING    WBC,  Synovial 9,100 (H) 0 - 200 /cu mm    Comment: Performed at [facility] [address] [place], [place] [address]   Neutrophil, Synovial PENDING 0 - 25 %   Lymphocytes-Synovial Fld PENDING 0 - 20 %   Monocyte-Macrophage-Synovial Fluid  PENDING 50 - 90 %   Eosinophils-Synovial PENDING 0 - 1 %   Other Cells-SYN PENDING   Body fluid culture     Status: None (Preliminary result)   Collection Time: 03/26/18  1:38 PM  Result Value Ref Range   Specimen Description SYNOVIAL LEFT KNEE    Special Requests NONE    Gram Stain      MODERATE WBC PRESENT, PREDOMINANTLY PMN NO ORGANISMS SEEN Performed at [facility], [place] [address], [place], [place] [address]    Culture PENDING    Report Status PENDING     Dg Knee Left Port  Result Date: 03/26/2018 CLINICAL DATA:  Left knee pain and swelling. EXAM: PORTABLE LEFT KNEE - 1-2 VIEW COMPARISON:  No recent prior. FINDINGS: Prominent knee joint effusion. Diffuse tricompartment degenerative change left knee. Loose bodies may be present. No acute bony or joint abnormality identified. No evidence of fracture dislocation. Peripheral vascular calcification noted. Surgical clip noted over the medial soft tissues. IMPRESSION: 1. Prominent knee joint effusion. Tricompartment degenerative change left knee. Loose bodies may be present. No acute bony abnormality identified. 2.  Peripheral vascular disease. Electronically Signed   By: Marcello Moores  Register   On: 03/26/2018 10:11    Review of Systems  Constitutional: Positive for malaise/fatigue. Negative for chills and fever.  HENT: Negative.   Eyes: Negative.   Respiratory: Negative.   Cardiovascular: Positive for leg swelling. Negative for chest pain, palpitations, orthopnea, claudication and PND.  Gastrointestinal: Positive for blood in stool. Negative for abdominal pain, constipation, diarrhea, heartburn, nausea and vomiting.  Genitourinary:       On Hemodialysis  Musculoskeletal: Negative.   Skin:  Negative.   Neurological: Negative.  Negative for seizures.  Endo/Heme/Allergies: Bruises/bleeds easily.  Psychiatric/Behavioral: Negative.        For someone with this level of disease he seems very positive and in control.   Blood pressure (!) 87/50, pulse 90, temperature 98.4 F (36.9 C), temperature source Oral, resp. rate 16, height 5' 5"  (1.651 m), weight 87.6 kg (193 lb 2 oz), SpO2 94 %. Physical Exam  Constitutional: He is oriented to person, place, and time. He appears well-developed and well-nourished. No distress.  HENT:  Head: Normocephalic and atraumatic.  Mouth/Throat: Oropharynx is clear and moist. No oropharyngeal exudate.  Eyes: Right eye exhibits no discharge. Left eye exhibits no discharge. No scleral icterus.  Pupils are equal  Neck: Normal range of motion. Neck supple. No JVD present. No tracheal deviation present. No thyromegaly present.  Cardiovascular: Intact distal pulses.  No murmur heard. LVAD  Respiratory: Effort normal and breath sounds normal. No respiratory distress. He has no wheezes. He has no rales. He exhibits no tenderness.  LVAD/mediasternotomy  GI: Soft. Bowel sounds are normal. He exhibits no distension and no mass. There is no tenderness. There is no rebound and no guarding.  Musculoskeletal: He exhibits edema and tenderness.  Brace on left knee after tapping knee and injecting steroids  Lymphadenopathy:    He has no cervical adenopathy.  Neurological: He is alert and oriented to person, place, and time. No cranial nerve deficit.  Skin: Skin is warm and dry. No rash noted. He is not diaphoretic. No erythema. No pallor.  Psychiatric: He has a normal mood and affect. His behavior is normal. Judgment and thought content normal.    Assessment/Plan: Recurrent GI bleed of unknown etiology - S/p - Multiple evaluations for bleeding, Double Retro Ballon Enteroscopy,  Attempted Embolization 5/10 and 03/16/18 No active  bleeding identified.  Ongoing  bleeding with Medication changes, receiving multiple units of PRBCs.    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 Hulen Skains has seen the patient and reviewed his history.  The verbal report from Dr. Lyndel Safe, GI service is that he continues to bleed and again a source has not been localized.  His recommendation that patient be transferred back to [facility] for combined Double-retro-balloon enteroscopy and surgical resection once a source is found.  As well be reviewed and we will follow with you.  Currently patient hemodynamically stable and in no discomfort.  Jade Burkard 03/26/2018, 3:17 PM

## 2018-03-26 NOTE — Progress Notes (Signed)
LVAD Coordinator Rounding Note:  Re-admitted from [facility] on 03/20/18 with continuing GI bleed.    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 Darcey Nora under Destination Therapy criteria due to age excluding heart transplantation.  Pt sitting in chair; reports one bloody stools overnight. Hgb 7.9 this am after receiving 2 units PCs yesterday. Plan for one unit PCs today per [name].  Dialysis completed yesterday with UF of 3 kg.  Still c/o left knee pain; nurse reports he cannot bear wt. X-ray with orthopedic consult. Left knee aspiration and injection with Marcaine/Depomedrol performed.   Vital signs: Temp:  98.4 HR: 90 Doppler Pressure:  78 Automatic BP:  100/44 (56) O2 Sat: 94% RA Wt: 197>194>192>190>196>201>193lbs  LVAD interrogation reveals:  Speed:  9200 Flow:  6.2 Power: 6.1 w PI: 3.9 Alarms: none Events:  64 PI events on 03/25/18 Fixed speed: 9200 Low speed limit: 8600  Primary Controller: Replace back up battery in 7 months Back up controller: Replace back up battery in 75month   Drive Line: Right abdominal Sorbaview dressing with Tegaderm covering intact. Anchor intact and accurately applied. Pt has weekly dressing changes using weekly kit; rinse with sterile saline after Chloraprep applicator, no skin prep or Bio patch used due to skin irritation.   Weekly dressing changes per bedside RN - next dressing change due 03/29/18.  Labs:  LDH trend: 224>191>174>223>224>206  INR trend: coumadin stopped  Hgb trend: 9.5>8.2>7.2>5.5>8.0>8.1>7.9>8.1>7.9  Anticoagulation Plan: - INR Goal: coumadin stopped - ASA Dose: none due to hx of GI bleed - OP monthly Octreotide for hx of GI bleed  Blood Products:  [place]> 2 units PC's - 03/23/18> 2 unit PC - 03/24/18> 2 unit PC - 03/25/18> 2 unit PC -03/26/18> plan for 1 unit PC   Device: - Medtronic single lead -Therapies: on 231 bpm  Arrythmias: chronic afib  Renal:  - [facility] Adverse Events on VAD: - 02/2017> GIB- AVM clipped x2 - 07/2017> renal failure- HD started - 11/2017>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 Margins not clear at neck. - 02/23/18> hospitalization for GI bleed. Colon/EGD 02/27/18 with normal EGD. Colonoscopy identified active bleeding in ileum; source not found. Capsule Endoscopy 02/27/18: Showed active bleeding in the ileum. Received 5 units blood with histamine reaction; will give benadryl as OP. - 03[facility]10/18>[facility] sent to IR emolization x 3 with no evidence of bleeding. Received 12 units PCs, remained off anticoagulants. [facility]  Plan/Recommendations: 1.  Weekly dressing changes with special instructions (above). Bedside RN may change dressing; next due              03/30/18. 2.  [name] film of CTA abd/pelvis on 03/17/18 available in Epic for [place].  Call VAD pager if any questions re: VAD equipment or drive line site issues.   MZada GirtRN, VAD Coordinator 24/7 VAD pager: [phone]

## 2018-03-26 NOTE — Consult Note (Signed)
[place] Nurse wound consult note Reason for Consult: Right lower leg wound Patient states that [name] removed a skin cancer from the area about 3 months ago.  It was completely healed until this episode of "extreme swelling" then it opened back up.  The open, partial thickness pre-tibial wound measures 2.2 cm x 0.9 cm. The wound bed is 100% clean and just needs to re-epithelialize again.  At the time of my assessment in 2 C 03, there is a piece of telfa to the wound bed and 2x2 gauze taped in place.  Plan for the area is:  vaseline guaze, 2x2 gauze, tape in place and change daily. Monitor the wound area(s) for worsening of condition such as: Signs/symptoms of infection,  Increase in size,  Development of or worsening of odor, Development of pain, or increased pain at the affected locations.  Notify the medical team if any of these develop.  Thank you for the consult.  Discussed plan of care with the patient and bedside nurse.  [place] nurse will not follow at this time.  Please re-consult the [place] team if needed.  Val Riles, RN, MSN, CWOCN, CNS-BC, pager [phone]

## 2018-03-26 NOTE — Progress Notes (Signed)
Physical Therapy Treatment Patient Details Name: Trevor [name] MRN: [id number] DOB: 03/18/1946 Today's Date: 03/26/2018    History of Present Illness Trevor [name] is a 72 y.o. male with a history of CAD s/p CABG x 4 2010, OSA, AS with TAVR 2015 , ESRD on HD, DM2, chronic Afib, asthma, GI bleed, and ICM with Medtronic ICD.  S/P HMII LVAD for Destination therapy on 12/13/2016. admitted for symptomatic anemia. Pt with con't GI bleed. Went to [facility] for a week and was unsuccessful with treatment to stop the bleed.    PT Comments    Pt pleasant and agreeable to therapy. Pt reports L knee pain that increases with knee flexion and has limited pt mobility in last 2 days. Pt able to perform sit>stand and ambulate 100 feet slowly min guard with RW with 1 standing rest break required to allow L knee to rest. Pt reports that ambulation helps knee "loosen up" X-ray findings negative on L knee. Pt proficient with LVAD management.   Follow Up Recommendations  Home health PT;Supervision/Assistance - 24 hour     Equipment Recommendations       Recommendations for Other Services       Precautions / Restrictions Precautions Precaution Comments: LVAD Restrictions Weight Bearing Restrictions: No    Mobility  Bed Mobility               General bed mobility comments: pt up in chair upon PT arrival  Transfers Overall transfer level: Modified independent               General transfer comment: increased time with pt guarding in anticipation of pain.   Ambulation/Gait Ambulation/Gait assistance: Min guard Ambulation Distance (Feet): 100 Feet Assistive device: Rolling walker (2 wheeled) Gait Pattern/deviations: Step-through pattern;Decreased stride length;Decreased stance time - left   Gait velocity interpretation: 1.31 - 2.62 ft/sec, indicative of limited community ambulator General Gait Details: pt with increased left knee pain with swing through and toe off, relying on RW to  support weight and decrease stress to left knee   Stairs             Wheelchair Mobility    Modified Rankin (Stroke Patients Only)       Balance Overall balance assessment: No apparent balance deficits (not formally assessed)                                          Cognition Arousal/Alertness: Awake/alert Behavior During Therapy: WFL for tasks assessed/performed Overall Cognitive Status: Within Functional Limits for tasks assessed                                        Exercises      General Comments        Pertinent Vitals/Pain Pain Score: 7  Pain Location: left knee with flexion Pain Descriptors / Indicators: Constant;Throbbing Pain Intervention(s): Limited activity within patient's tolerance;Repositioned    Home Living                      Prior Function            PT Goals (current goals can now be found in the care plan section) Progress towards PT goals: Progressing toward goals    Frequency  PT Plan Current plan remains appropriate    Co-evaluation              AM-PAC PT "6 Clicks" Daily Activity  Outcome Measure  Difficulty turning over in bed (including adjusting bedclothes, sheets and blankets)?: None Difficulty moving from lying on back to sitting on the side of the bed? : None Difficulty sitting down on and standing up from a chair with arms (e.g., wheelchair, bedside commode, etc,.)?: A Little Help needed moving to and from a bed to chair (including a wheelchair)?: A Little Help needed walking in hospital room?: A Little Help needed climbing 3-5 steps with a railing? : A Little 6 Click Score: 20    End of Session Equipment Utilized During Treatment: Gait belt Activity Tolerance: Patient tolerated treatment well Patient left: in chair;with call bell/phone within reach Nurse Communication: Mobility status PT Visit Diagnosis: Muscle weakness (generalized) (M62.81);Other  abnormalities of gait and mobility (R26.89)     Time: 6237-6283 PT Time Calculation (min) (ACUTE ONLY): 27 min  Charges:  $Gait Training: 8-22 mins $Therapeutic Activity: 8-22 mins                    G Codes:       Gabe Dennie Vecchio, SPT   [employer] 03/26/2018, 10:18 AM

## 2018-03-26 NOTE — Plan of Care (Signed)
Continue current care plan 

## 2018-03-26 NOTE — Progress Notes (Addendum)
Daily Rounding Note  03/26/2018, 10:28 AM  LOS: 6 days   SUBJECTIVE:   Chief complaint: none.  Continues to pass blood.  Feels weak, fatigued which is his normal, predictable response to significant drops in Hgb.     Remains in good spirits  OBJECTIVE:         Vital signs in last 24 hours:    Temp:  [97.5 F (36.4 C)-98.7 F (37.1 C)] 97.8 F (36.6 C) (05/23 0300) Pulse Rate:  [43-94] 94 (05/23 0952) Resp:  [15-18] 16 (05/23 0300) BP: (70-118)/(24-94) 80/56 (05/23 0301) SpO2:  [92 %-95 %] 93 % (05/23 0300) Weight:  [193 lb 2 oz (87.6 kg)-201 lb 8 oz (91.4 kg)] 193 lb 2 oz (87.6 kg) (05/23 0300) Last BM Date: 03/25/18 Filed Weights   03/25/18 1355 03/25/18 1805 03/26/18 0300  Weight: 201 lb 8 oz (91.4 kg) 194 lb 14.2 oz (88.4 kg) 193 lb 2 oz (87.6 kg)   General: looks well, alert   Heart: VAD hum Chest: clear but very reduced BS Abdomen: soft, NT, ND.  Active BS.  Drive line site benign  Extremities: mild LE edema Neuro/Psych:  Oriented x 3.  Fully alert.  Moving all 4 limbs.   Intake/Output from previous day: 05/22 0701 - 05/23 0700 In: 2605 [P.O.:840; I.V.:1125; Blood:640] Out: 3006 [Stool:1]  Intake/Output this shift: No intake/output data recorded.  Lab Results: Recent Labs    03/24/18 0228  03/25/18 0221 03/25/18 1938 03/26/18 0258  WBC 9.7  --  9.5  --  7.9  HGB 7.9*   < > 8.1* 9.8* 7.9*  HCT 24.0*   < > 24.8* 29.3* 24.1*  PLT 155  --  149*  --  120*   < > = values in this interval not displayed.   BMET Recent Labs    03/24/18 0228 03/25/18 0221 03/26/18 0258  NA 136 135 136  K 3.6 3.7 4.0  CL 99* 99* 101  CO2 30 29 29   GLUCOSE 145* 141* 222*  BUN 12 22* 14  CREATININE 2.48* 3.51* 3.04*  CALCIUM 7.2* 7.1* 7.1*   LFT Recent Labs    03/26/18 0258  ALBUMIN 2.2*   PT/INR Recent Labs    03/25/18 0221 03/26/18 0258  LABPROT 14.6 15.4*  INR 1.15 1.23   Hepatitis Panel No  results for input(s): HEPBSAG, HCVAB, HEPAIGM, HEPBIGM in the last 72 hours.  Studies/Results: Dg Knee Left Port  Result Date: 03/26/2018 CLINICAL DATA:  Left knee pain and swelling. EXAM: PORTABLE LEFT KNEE - 1-2 VIEW COMPARISON:  No recent prior. FINDINGS: Prominent knee joint effusion. Diffuse tricompartment degenerative change left knee. Loose bodies may be present. No acute bony or joint abnormality identified. No evidence of fracture dislocation. Peripheral vascular calcification noted. Surgical clip noted over the medial soft tissues. IMPRESSION: 1. Prominent knee joint effusion. Tricompartment degenerative change left knee. Loose bodies may be present. No acute bony abnormality identified. 2.  Peripheral vascular disease. Electronically Signed   By: Marcello Moores  Register   On: 03/26/2018 10:11    ASSESMENT:   *    Chronic lower GI bleeding, unable to locate exact source of bleeding but it appears to be in the distal small bowel or proximal colon.  Extensive radiologic and endoscopic testing. Capsule end completed 5/22, to be read Gen surgery at [facility] considered total colectomy though pt reluctant, and ideally would want to determine that the colon. and not distal SB, is  source of bleeding.    *    Blood loss anemia.  Status post multiple PRBCs over the course of the last 3 weeks. 8 PRBCs since readmitted from [facility] on 5/17, latest 2 on 5/22.   hgb 8.1 >> 9.8 >> 7.9 in last 24 hours.   *   Heart failure.  S/P LVAD placement.  *    Chronic Coumadin on hold.  *    ESRD.  On hemodialysis   PLAN   *  Await reading of capsule endo.     Azucena Freed  03/26/2018, 10:28 AM Phone [phone]   Attending physician's note   I have taken an interval history, reviewed the chart and examined the patient. I agree with the Advanced Practitioner's note, impression and recommendations.   72 y.o.yo male withESRD on HD, MWF, CAD, CABG, AS S/P TAVR, CHF with ICD, S/P LVAD with obscure recurrent  GI bleed.Coumadin was stopped.  He had extensive GI work-up as above, attempted push enteroscopy and retrograde DBE at [facility] (unable to reach the bleeding site), s/pIR provocative mesenteric angiography with TPA but unable to find a bleeding lesion. Capsule endoscopy shows active bleeding in the mid ileum (capsule did not reach the cecum).  Patient with continued bleeding requiring blood transfusions.   Judson Roch has discussed above findings with cardiology.    Recommend: To transfer patient to [facility] for possible intraoperative enteroscopy and further evaluation. Trend CBC    Carmell Austria, MD

## 2018-03-26 NOTE — Progress Notes (Signed)
Trevor [name]  Assessment/ Plan: Pt is a 72 y.o. yo male  with ESRD on hemodialysis MWF at [facility]. He has complex past medical history of CAD, CABG, AS S/P TAVR, ischemic cardiomyopathy with ICD, S/P LVAD placement, here with recurrent GI bleed.Coumadin was stopped. He received blood transfusion.  Transferred to [facility] for deep entersospcopy on 03/10/2018. Hospital course at [facility] double retro ballon scope and he was sent for IR embolization x 3but he had no evidence of bleeding.  Assessment/Plan:  #Anemia/GI bleed: Status post multiple red blood cell transfusion. CT angio with no active bleeding. Waiting for the result of capsule endoscopy today. Still having GIB.  Hb 7.9. On Aranesp 100 micrograms weekly.  # ESRD: Monday Wednesday Friday.  Had dialysis yesterday with UF of almost 3 kg.  Tolerated well.  Serum potassium level 4 today.  Plan for next dialysis tomorrow.  Patient has chronic hypotension.  Need to monitor blood pressure closely.  He is asymptomatic.   Left AV fistula for access  # Secondary hyperparathyroidism: Phosphorus level low therefore discontinue Renvela.  continue calcitriol.  Monitor labs.  #CAD, hx CABG, LVAD/ afib per cardiology  Subjective: Seen and examined at bedside.  Still having blood in the bowel movement.  Denied nausea vomiting chest pain shortness of breath.  Had dialysis yesterday, tolerated well. Objective Vital signs in last 24 hours: Vitals:   03/25/18 2300 03/25/18 2301 03/26/18 0300 03/26/18 0301  BP: 92/75 92/75 (!) 80/56 (!) 80/56  Pulse:   (!) 43   Resp:   16   Temp: 98.3 F (36.8 C)  97.8 F (36.6 C)   TempSrc: Oral  Oral   SpO2:   93%   Weight:   87.6 kg (193 lb 2 oz)   Height:       Weight change: 2.495 kg (5 lb 8 oz)  Intake/Output Summary (Last 24 hours) at 03/26/2018 0926 Last data filed at 03/26/2018 0600 Gross per 24 hour  Intake 2605 ml  Output 3006 ml  Net -401  ml       Labs: Basic Metabolic Panel: Recent Labs  Lab 03/21/18 0815 03/22/18 0608  03/24/18 0228 03/25/18 0221 03/26/18 0258  NA 135 134*   < > 136 135 136  K 4.3 4.1   < > 3.6 3.7 4.0  CL 100* 100*   < > 99* 99* 101  CO2 27 25   < > 30 29 29   GLUCOSE 75 166*   < > 145* 141* 222*  BUN 28* 36*   < > 12 22* 14  CREATININE 4.73* 5.46*   < > 2.48* 3.51* 3.04*  CALCIUM 7.6* 7.2*   < > 7.2* 7.1* 7.1*  PHOS 2.7 2.3*  --   --   --  2.2*   < > = values in this interval not displayed.   Liver Function Tests: Recent Labs  Lab 03/21/18 0815 03/22/18 0608 03/26/18 0258  ALBUMIN 2.4* 2.1* 2.2*   No results for input(s): LIPASE, AMYLASE in the last 168 hours. No results for input(s): AMMONIA in the last 168 hours. CBC: Recent Labs  Lab 03/22/18 1206 03/23/18 0108 03/24/18 0228  03/25/18 0221 03/25/18 1938 03/26/18 0258  WBC 9.6 8.6 9.7  --  9.5  --  7.9  NEUTROABS 7.7  --   --   --   --   --   --   HGB 8.0* 8.1* 7.9*   < > 8.1* 9.8* 7.9*  HCT 24.6* 25.0* 24.0*   < > 24.8* 29.3* 24.1*  MCV 86.9 88.0 87.3  --  90.2  --  88.9  PLT 162 155 155  --  149*  --  120*   < > = values in this interval not displayed.   Cardiac Enzymes: No results for input(s): CKTOTAL, CKMB, CKMBINDEX, TROPONINI in the last 168 hours. CBG: Recent Labs  Lab 03/25/18 0752 03/25/18 1238 03/25/18 1733 03/25/18 2114 03/26/18 0831  GLUCAP 80 119* 168* 268* 136*    Iron Studies: No results for input(s): IRON, TIBC, TRANSFERRIN, FERRITIN in the last 72 hours. Studies/Results: No results found.  Medications: Infusions: . sodium chloride    . octreotide  (SANDOSTATIN)    IV infusion 50 mcg/hr (03/26/18 0600)    Scheduled Medications: . atorvastatin  40 mg Oral q1800  . busPIRone  5 mg Oral BID  . calcitRIOL  0.5 mcg Oral Q M,W,F-HD  . danazol  100 mg Oral BID  . darbepoetin (ARANESP) injection - DIALYSIS  100 mcg Intravenous Q Mon-HD  . diphenhydrAMINE  25 mg Oral Once  . gabapentin   300 mg Oral BID  . insulin aspart  0-9 Units Subcutaneous TID WC  . insulin glargine  8 Units Subcutaneous QHS  . levothyroxine  25 mcg Oral QAC breakfast  . magnesium oxide  400 mg Oral Daily  . mometasone-formoterol  2 puff Inhalation BID  . multivitamin  1 tablet Oral QHS  . pantoprazole  40 mg Oral Daily  . sevelamer carbonate  1,600 mg Oral TID WC  . sildenafil  40 mg Oral TID    have reviewed scheduled and prn medications.  Physical Exam: General: Not in distress, comfortable Heart: Regular rate rhythm S1-S2 normal Lungs: Clear bilateral, no crackle Abdomen:soft, Non-tender, non-distended Extremities: Lower extremities edema improving. Dialysis Access: Left AV fistula has good thrill and bruit.  Dron Prasad Bhandari 03/26/2018,9:26 AM  LOS: 6 days

## 2018-03-26 NOTE — Procedures (Signed)
Procedure: Left knee aspiration and injection  Indication: Left knee effusion(s)  Surgeon: Silvestre Gunner, PA-C  Assist: None  Anesthesia: None  EBL: None  Complications: None  Findings: After risks/benefits explained patient desires to undergo procedure. Consent obtained and time out performed. The left knee was sterilely prepped and aspirated. 3ml clear yellow fluid obtained. Sent for GS, culture, and cell count. 23ml 0.5% Marcaine and 40mg  Depomedrol instilled into joint. Pt tolerated the procedure well.    Lisette Abu, PA-C Orthopedic Surgery [phone]

## 2018-03-27 DIAGNOSIS — Q2730 Arteriovenous malformation, site unspecified: Secondary | ICD-10-CM

## 2018-03-27 LAB — CBC
HEMATOCRIT: 23.1 % — AB (ref 39.0–52.0)
Hemoglobin: 7.5 g/dL — ABNORMAL LOW (ref 13.0–17.0)
MCH: 29.3 pg (ref 26.0–34.0)
MCHC: 32.5 g/dL (ref 30.0–36.0)
MCV: 90.2 fL (ref 78.0–100.0)
Platelets: 137 10*3/uL — ABNORMAL LOW (ref 150–400)
RBC: 2.56 MIL/uL — AB (ref 4.22–5.81)
RDW: 17.1 % — ABNORMAL HIGH (ref 11.5–15.5)
WBC: 9.4 10*3/uL (ref 4.0–10.5)

## 2018-03-27 LAB — RENAL FUNCTION PANEL
Albumin: 2.1 g/dL — ABNORMAL LOW (ref 3.5–5.0)
Anion gap: 7 (ref 5–15)
BUN: 22 mg/dL — ABNORMAL HIGH (ref 6–20)
CHLORIDE: 100 mmol/L — AB (ref 101–111)
CO2: 27 mmol/L (ref 22–32)
CREATININE: 4.15 mg/dL — AB (ref 0.61–1.24)
Calcium: 7.1 mg/dL — ABNORMAL LOW (ref 8.9–10.3)
GFR calc non Af Amer: 13 mL/min — ABNORMAL LOW (ref >60)
GFR, EST AFRICAN AMERICAN: 15 mL/min — AB (ref >60)
Glucose, Bld: 214 mg/dL — ABNORMAL HIGH (ref 65–99)
POTASSIUM: 4.5 mmol/L (ref 3.5–5.1)
Phosphorus: 3.2 mg/dL (ref 2.5–4.6)
Sodium: 134 mmol/L — ABNORMAL LOW (ref 135–145)

## 2018-03-27 LAB — GLUCOSE, CAPILLARY
GLUCOSE-CAPILLARY: 144 mg/dL — AB (ref 65–99)
GLUCOSE-CAPILLARY: 177 mg/dL — AB (ref 65–99)
Glucose-Capillary: 202 mg/dL — ABNORMAL HIGH (ref 65–99)
Glucose-Capillary: 91 mg/dL (ref 65–99)

## 2018-03-27 LAB — LACTATE DEHYDROGENASE: LDH: 186 U/L (ref 98–192)

## 2018-03-27 LAB — PREPARE RBC (CROSSMATCH)

## 2018-03-27 LAB — HEMOGLOBIN AND HEMATOCRIT, BLOOD
HEMATOCRIT: 27.4 % — AB (ref 39.0–52.0)
HEMOGLOBIN: 9 g/dL — AB (ref 13.0–17.0)

## 2018-03-27 LAB — PROTIME-INR
INR: 1.27
Prothrombin Time: 15.8 seconds — ABNORMAL HIGH (ref 11.4–15.2)

## 2018-03-27 MED ORDER — ALBUMIN HUMAN 25 % IV SOLN
25.0000 g | Freq: Once | INTRAVENOUS | Status: AC
  Administered 2018-03-27: 25 g via INTRAVENOUS
  Filled 2018-03-27: qty 100

## 2018-03-27 MED ORDER — DIPHENHYDRAMINE HCL 25 MG PO CAPS
25.0000 mg | ORAL_CAPSULE | Freq: Once | ORAL | Status: AC
  Administered 2018-03-27: 25 mg via ORAL
  Filled 2018-03-27: qty 1

## 2018-03-27 MED ORDER — ESTROGENS CONJUGATED 0.3 MG PO TABS
0.3000 mg | ORAL_TABLET | Freq: Every day | ORAL | Status: AC
  Administered 2018-03-27: 0.3 mg via ORAL
  Filled 2018-03-27 (×2): qty 1

## 2018-03-27 MED ORDER — HEPARIN SODIUM (PORCINE) 1000 UNIT/ML DIALYSIS: 1000.0000 [IU] | INTRAMUSCULAR | Status: DC | PRN

## 2018-03-27 MED ORDER — ALBUMIN HUMAN 25 % IV SOLN
INTRAVENOUS | Status: AC
  Administered 2018-03-27: 25 g via INTRAVENOUS
  Filled 2018-03-27: qty 100

## 2018-03-27 MED ORDER — SODIUM CHLORIDE 0.9 % IV SOLN: 100.0000 mL | INTRAVENOUS | Status: DC | PRN

## 2018-03-27 MED ORDER — SODIUM CHLORIDE 0.9 % IV SOLN
Freq: Once | INTRAVENOUS | Status: AC
  Administered 2018-03-27: 11:00:00 via INTRAVENOUS

## 2018-03-27 MED ORDER — PENTAFLUOROPROP-TETRAFLUOROETH EX AERO
1.0000 "application " | INHALATION_SPRAY | CUTANEOUS | Status: DC | PRN
  Filled 2018-03-27: qty 103.5

## 2018-03-27 MED ORDER — ALTEPLASE 2 MG IJ SOLR: 2.0000 mg | Freq: Once | INTRAMUSCULAR | Status: DC | PRN

## 2018-03-27 MED ORDER — LIDOCAINE HCL (PF) 1 % IJ SOLN: 5.0000 mL | INTRAMUSCULAR | Status: DC | PRN

## 2018-03-27 MED ORDER — LIDOCAINE-PRILOCAINE 2.5-2.5 % EX CREA
1.0000 "application " | TOPICAL_CREAM | CUTANEOUS | Status: DC | PRN
  Filled 2018-03-27: qty 5

## 2018-03-27 NOTE — Progress Notes (Signed)
LVAD Coordinator Rounding Note:  Re-admitted from [facility] on 03/20/18 with continuing GI bleed.    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 Darcey Nora under Destination Therapy criteria due to age excluding heart transplantation.  Pt sitting in chair; reports one bloody stools overnight. Hgb 7.5 this am after receiving 1 unit PC yesterday. Plan for 2 units PCs today per [name].  Both sisters present for family meeting today.   Vital signs: Temp:  98.3 HR: 83 Doppler Pressure:  70 Automatic BP:  116/49 (66) O2 Sat: 95% RA Wt: 197>194>192>190>196>201>193>204lbs  LVAD interrogation reveals:  Speed:  9200 Flow:  6.4 Power: 6.1 w PI: 3.7 Alarms: none Events:  4-5 PI events on 03/26/18 Fixed speed: 9200 Low speed limit: 8600  Primary Controller: Replace back up battery in 7 months Back up controller: Replace back up battery in 84month   Drive Line: Right abdominal Sorbaview dressing with Tegaderm covering intact. Anchor intact and accurately applied. Pt has weekly dressing changes using weekly kit; rinse with sterile saline after Chloraprep applicator, no skin prep or Bio patch used due to skin irritation.   Weekly dressing changes per bedside RN - next dressing change due 03/29/18.  Labs:  LDH trend: 224>191>174>223>224>206>186  INR trend: coumadin stopped  Hgb trend: 9.5>8.2>7.2>5.5>8.0>8.1>7.9>8.1>7.9>7.5  Anticoagulation Plan: - INR Goal: coumadin stopped - ASA Dose: none due to hx of GI bleed - OP monthly Octreotide for hx of GI bleed  Blood Products:  [place] 2 units PC's - 03/23/18> 2 unit PC - 03/24/18> 2 unit PC - 03/25/18> 2 unit PC -03/26/18> plan for 1 unit PC -03/27/18> 2 unit PCs   Device: - Medtronic single lead -Therapies: on 231 bpm  Arrythmias: chronic afib  Renal:  [facility] Adverse Events on VAD: - 02/2017> GIB- AVM clipped x2 - 07/2017> renal failure-  HD started - 11/2017>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 Margins not clear at neck. - 02/23/18> hospitalization for GI bleed. Colon/EGD 02/27/18 with normal EGD. Colonoscopy identified active bleeding in ileum; source not found. Capsule Endoscopy 02/27/18: Showed active bleeding in the ileum. Received 5 units blood with histamine reaction; will give benadryl as OP. - [facility]10[facility]18[facility] to IR emolization x 3 with no evidence of bleeding. Received 12 units PCs, remained off anticoagulants. [facility]  Plan/Recommendations: 1.  Weekly dressing changes with special instructions (above). Bedside RN may change dressing; next due 03/30/18. 2.  Capsule study from 5/22 [facility] 3. Continue Octreotide drip. 4.  Call VAD pager if any questions re: VAD equipment or drive line site issues.   STanda RockersRN, VAD Coordinator 24/7 VAD pager: [phone]

## 2018-03-27 NOTE — Progress Notes (Signed)
Subjective: 2 Days Post-Op Procedure(s) (LRB): GIVENS CAPSULE STUDY (N/A)  Patient reports pain as mild to moderate.  States that L knee is symptomatically improved since aspiration and injection yesterday.  Notes that his ROM is better and that he is able to walk with a minimal limp.  Objective:   VITALS:  Temp:  [98.2 F (36.8 C)-99 F (37.2 C)] 98.2 F (36.8 C) (05/24 0316) Pulse Rate:  [84-114] 84 (05/24 0316) Resp:  [15-18] 16 (05/24 0316) BP: (65-146)/(44-77) 75/60 (05/23 2327) SpO2:  [91 %-95 %] 93 % (05/24 0316) Weight:  [89.5 kg (197 lb 5 oz)] 89.5 kg (197 lb 5 oz) (05/24 0500)  General: WDWN patient in NAD. Psych:  Appropriate mood and affect. Neuro:  A&O x 3, Moving all extremities, sensation intact to light touch HEENT:  EOMs intact Chest:  Even non-labored respirations Skin:  Incision C/D/I, no rashes or lesions Extremities: warm/dry, mild edema to L knee, no erythema or echymosis.  No lymphadenopathy. Pulses: Popliteus 2+ MSK:  ROM: TKE, KF > 90 degrees.  Ambulates with stiff gait to L., MMT: able to perform quad set,     LABS Recent Labs    03/25/18 0221 03/25/18 1938 03/26/18 0258 03/26/18 1602 03/27/18 0309  HGB 8.1* 9.8* 7.9* 8.6* 7.5*  WBC 9.5  --  7.9  --  9.4  PLT 149*  --  120*  --  137*   Recent Labs    03/26/18 0258 03/27/18 0309  NA 136 134*  K 4.0 4.5  CL 101 100*  CO2 29 27  BUN 14 22*  CREATININE 3.04* 4.15*  GLUCOSE 222* 214*   Recent Labs    03/26/18 0258 03/27/18 0309  INR 1.23 1.27     Assessment/Plan: 2 Days Post-Op Procedure(s) (LRB): GIVENS CAPSULE STUDY (N/A)  WBAT L LE T-ROM brace prn  Elevate and ice prn pain/swelling F/u with Dr. Doran Durand prn in outpatient setting. Ortho signing off.  Mechele Claude PA-C [facility] Office:  [phone]

## 2018-03-27 NOTE — Progress Notes (Addendum)
     [facility] Progress Note   Chief Complaint:   GI bleeding  SUBJECTIVE:    still passing bloody BMs, last one was last night.    ASSESSMENT AND PLAN:    82. 72 yo male with multiple medical problems admitted with chronic lower GI bleeding, exact location undetermined but suspecting distal small bowel or proximal colon. Extensive workup negative. Endo capsule study incomplete, capsule remained in small bowel where there was active bleeding seen but exact source couldn't be identified. He continues to have bloody stools. Hgb down from 8.6 to 7.5 overnight.  -will need transfer to [facility]. Patient says that he and sisters are having a meeting with Cardiology today around noon to discuss the transfer  2. Anemia. Required several units of blood over last few weeks.   3. Heart failure, s/p LVAD   4. Chronic coumadin, on hold  5. ESRD, on HD   OBJECTIVE:     Vital signs in last 24 hours: Temp:  [98.2 F (36.8 C)-99 F (37.2 C)] 98.2 F (36.8 C) (05/24 0316) Pulse Rate:  [84-114] 84 (05/24 0316) Resp:  [15-18] 16 (05/24 0316) BP: (65-146)/(50-77) 75/60 (05/23 2327) SpO2:  [91 %-95 %] 93 % (05/24 0316) Weight:  [197 lb 5 oz (89.5 kg)] 197 lb 5 oz (89.5 kg) (05/24 0500) Last BM Date: 03/26/18 General:   Alert, well-developed,  white male  in NAD EENT:  Normal hearing, non icteric sclera, conjunctive pink.  Heart: LVAD hum.  Pulm: Normal respiratory effort, lungs CTA bilaterally without wheezes or crackles. Abdomen:  Soft, nontender.  Normal bowel sounds Neurologic:  Alert and  oriented x4;  grossly normal neurologically. Psych:  Pleasant, cooperative.  Normal mood and affect.   Intake/Output from previous day: 05/23 0701 - 05/24 0700 In: 1570 [P.O.:480; I.V.:775; Blood:315] Out: -  Intake/Output this shift: No intake/output data recorded.  Lab Results: Recent Labs    03/25/18 0221  03/26/18 0258 03/26/18 1602 03/27/18 0309  WBC 9.5  --  7.9  --   9.4  HGB 8.1*   < > 7.9* 8.6* 7.5*  HCT 24.8*   < > 24.1* 26.5* 23.1*  PLT 149*  --  120*  --  137*   < > = values in this interval not displayed.   BMET Recent Labs    03/25/18 0221 03/26/18 0258 03/27/18 0309  NA 135 136 134*  K 3.7 4.0 4.5  CL 99* 101 100*  CO2 29 29 27   GLUCOSE 141* 222* 214*  BUN 22* 14 22*  CREATININE 3.51* 3.04* 4.15*  CALCIUM 7.1* 7.1* 7.1*   LFT Recent Labs    03/27/18 0309  ALBUMIN 2.1*   PT/INR Recent Labs    03/26/18 0258 03/27/18 0309  LABPROT 15.4* 15.8*  INR 1.23 1.27      LOS: 7 days   Tye Savoy ,NP 03/27/2018, 9:30 AM  Pager number [phone]   Attending physician's note   I have taken an interval history, reviewed the chart and examined the patient. I agree with the Advanced Practitioner's note, impression and recommendations.   S/P 2U PRBC today with continued bleeding, capsule endoscopy shows small bowel source with active bleeding.  [name] has graciously accepted. Awaiting transfer.  for now continue to monitor hemoglobin/hematocrit and transfuse.  Continue octreotide.  No new GI recommendations.  Please call with any questions.  Discussed with the patient in detail.   Carmell Austria, MD

## 2018-03-27 NOTE — Progress Notes (Signed)
[facility] NEPHROLOGY PROGRESS NOTE  Assessment/ Plan: Pt is a 72 y.o. yo male  with ESRD on hemodialysis MWF at [facility]. He has complex past medical history of CAD, CABG, AS S/P TAVR, ischemic cardiomyopathy with ICD, S/P LVAD placement, here with recurrent GI bleed.Coumadin was stopped. He received blood transfusion.  Transferred to [facility] for deep entersospcopy on 03/10/2018. Hospital course at [facility] double retro ballon scope and he was sent for IR embolization x 3but he had no evidence of bleeding.  Assessment/Plan:  #Anemia/GI bleed: Status post multiple red blood cell transfusion.  Capsule endoscopy with possible mid ileum bleeding.  Evaluated by surgery recommended to transfer to higher center.  Having family meeting this afternoon.  No improvement in GI bleed.  Hemoglobin 7.5.   On Aranesp 100 micrograms weekly.  # ESRD: Monday Wednesday Friday.   -Plan for dialysis today after the family meeting.  Cardiac patient, plan for dialysis with 3K bath.  Patient has chronic hypotension.  Need to monitor blood pressure closely.  He is asymptomatic.   Left AV fistula for access  # Secondary hyperparathyroidism: Phosphorus level low therefore discontinued Renvela.  continue calcitriol.  Monitor labs.  #CAD, hx CABG, LVAD/ afib per cardiology  #Left knee pain: Evaluated by orthopedics, status post arthrocentesis.  Feeling better today.  Subjective: Seen and examined at bedside.  No improvement in bleeding.  Denied chest pain, shortness of breath, nausea vomiting.  Reported knee pain is okay.  Denied dizziness or lightheadedness. Objective Vital signs in last 24 hours: Vitals:   03/26/18 1954 03/26/18 2327 03/27/18 0316 03/27/18 0500  BP: 102/69 (!) 75/60    Pulse: (!) 114  84   Resp: 16 18 16    Temp: 99 F (37.2 C) 98.2 F (36.8 C) 98.2 F (36.8 C)   TempSrc: Oral Oral Oral   SpO2: 91% 95% 93%   Weight:    89.5 kg (197 lb 5 oz)  Height:        Weight change: -1.9 kg (-4 lb 3 oz)  Intake/Output Summary (Last 24 hours) at 03/27/2018 0946 Last data filed at 03/27/2018 0300 Gross per 24 hour  Intake 1570 ml  Output -  Net 1570 ml       Labs: Basic Metabolic Panel: Recent Labs  Lab 03/22/18 0608  03/25/18 0221 03/26/18 0258 03/27/18 0309  NA 134*   < > 135 136 134*  K 4.1   < > 3.7 4.0 4.5  CL 100*   < > 99* 101 100*  CO2 25   < > 29 29 27   GLUCOSE 166*   < > 141* 222* 214*  BUN 36*   < > 22* 14 22*  CREATININE 5.46*   < > 3.51* 3.04* 4.15*  CALCIUM 7.2*   < > 7.1* 7.1* 7.1*  PHOS 2.3*  --   --  2.2* 3.2   < > = values in this interval not displayed.   Liver Function Tests: Recent Labs  Lab 03/22/18 0608 03/26/18 0258 03/27/18 0309  ALBUMIN 2.1* 2.2* 2.1*   No results for input(s): LIPASE, AMYLASE in the last 168 hours. No results for input(s): AMMONIA in the last 168 hours. CBC: Recent Labs  Lab 03/22/18 1206 03/23/18 0108 03/24/18 0228  03/25/18 0221  03/26/18 0258 03/26/18 1602 03/27/18 0309  WBC 9.6 8.6 9.7  --  9.5  --  7.9  --  9.4  NEUTROABS 7.7  --   --   --   --   --   --   --   --  HGB 8.0* 8.1* 7.9*   < > 8.1*   < > 7.9* 8.6* 7.5*  HCT 24.6* 25.0* 24.0*   < > 24.8*   < > 24.1* 26.5* 23.1*  MCV 86.9 88.0 87.3  --  90.2  --  88.9  --  90.2  PLT 162 155 155  --  149*  --  120*  --  137*   < > = values in this interval not displayed.   Cardiac Enzymes: No results for input(s): CKTOTAL, CKMB, CKMBINDEX, TROPONINI in the last 168 hours. CBG: Recent Labs  Lab 03/26/18 0831 03/26/18 1232 03/26/18 1801 03/26/18 2119 03/27/18 0745  GLUCAP 136* 188* 183* 171* 202*    Iron Studies: No results for input(s): IRON, TIBC, TRANSFERRIN, FERRITIN in the last 72 hours. Studies/Results: Dg Knee Left Port  Result Date: 03/26/2018 CLINICAL DATA:  Left knee pain and swelling. EXAM: PORTABLE LEFT KNEE - 1-2 VIEW COMPARISON:  No recent prior. FINDINGS: Prominent knee joint effusion. Diffuse  tricompartment degenerative change left knee. Loose bodies may be present. No acute bony or joint abnormality identified. No evidence of fracture dislocation. Peripheral vascular calcification noted. Surgical clip noted over the medial soft tissues. IMPRESSION: 1. Prominent knee joint effusion. Tricompartment degenerative change left knee. Loose bodies may be present. No acute bony abnormality identified. 2.  Peripheral vascular disease. Electronically Signed   By: Marcello Moores  Register   On: 03/26/2018 10:11    Medications: Infusions: . sodium chloride    . octreotide  (SANDOSTATIN)    IV infusion 50 mcg/hr (03/27/18 0300)    Scheduled Medications: . atorvastatin  40 mg Oral q1800  . busPIRone  5 mg Oral BID  . calcitRIOL  0.5 mcg Oral Q M,W,F-HD  . Chlorhexidine Gluconate Cloth  6 each Topical Q0600  . danazol  100 mg Oral BID  . darbepoetin (ARANESP) injection - DIALYSIS  100 mcg Intravenous Q Mon-HD  . gabapentin  300 mg Oral BID  . insulin aspart  0-9 Units Subcutaneous TID WC  . insulin glargine  8 Units Subcutaneous QHS  . levothyroxine  25 mcg Oral QAC breakfast  . magnesium oxide  400 mg Oral Daily  . mometasone-formoterol  2 puff Inhalation BID  . multivitamin  1 tablet Oral QHS  . pantoprazole  40 mg Oral Daily  . sildenafil  40 mg Oral TID    have reviewed scheduled and prn medications.  Physical Exam: General: Not in distress, comfortable Heart: Regular rate rhythm S1-S2 normal Lungs: Clear bilateral, no crackles Abdomen:soft, Non-tender, non-distended Extremities: Improving lower extremities edema. Dialysis Access: Left AV fistula has good thrill and bruit.  Datha Kissinger Prasad Jacquese Hackman 03/27/2018,9:46 AM  LOS: 7 days

## 2018-03-27 NOTE — Plan of Care (Signed)
Continue current care plan 

## 2018-03-27 NOTE — Progress Notes (Addendum)
Patient ID: Trevor [name], male   DOB: August 18, 1946, 72 y.o.   MRN: [id number]   Advanced Heart Failure VAD Team Note  PCP-Cardiologist: No primary care provider on file.   Subjective:    GI events  2018 colonoscopy. Blood throughout colon, but not in TI. Oozing tiny AVMS at hepatic flexure, treated with APC, endoclips. Three small small polyps not removed. Left colon tics.  02/27/18 EGD. Normal, no bleeding.  02/27/18 Colonoscopy. Blood in TI, sugg of SB source. Blood throughout colon, no source found. Descending and sigmoid tics. Multiple small polyps of ascending to sigmoid, not removed.  02/28/18 Capsule endo. Fresh blood at ileum, starting at 6 hours 58 mins. Blood obscured identification of lesion. Blood throughout colon. Per Dr Marin Shutter "Ileum (where bleeding) not reachable locally, and likely not reachable by anterograde double balloon enteroscopy at [facility]".Transfused 5 U PRBCs during admission.  At discharge continued on monthly octreotide and started Doxycycline 100 bid to try to limit AVM bleeding (and for URI sx)with plannedtransition to danazol. Goal INR decreased to 1.8 - 2.3 03/09/18: Admitted with recurrent GI bleed. Coumadin was stopped. He received 3UPRBCs. Transferred to [facility] for deep entersospcopy on 03/10/2018. Hospital course at [facility] included double retro ballon scope and he was sent for  IR embolization x 3 but he had no evidence of bleeding. Received 12 UPRBCs. He remained off anticoagulants.    Seen by IR 5/20. CTA obtained, but no active bleeding found. Started on octreotide.  03/25/18: Repeat capsule endoscopy completed. Bleeding appears to come from the mid-ileum.  03/26/18: General surgery reconsulted. Recommended transfer back to [facility] for surgery with intraoperative endoscopy.   Ortho consulted yesterday for left knee pain. S/p knee aspiration and steroid injection.   He is off warfarin for now, INR 1.27  Received 2UPRBCs 5/20. + 1 unit PRBC 5/21 + 2  PRBC 5/22 -> hemoglobin 9.8 -> 7.9 -> 1 unit PRBC -> 8.6 -> 7.5 this am.   Feeling okay this morning. No CP or SOB. Had at least 1 bloody BM overnight. Knee is feeling much better. Has meeting with family, Dr Aundra Dubin, and GI today at noon. HD this afternoon.   LVAD INTERROGATION:  HeartMate 2 LVAD:   Flow 6.0 liters/min, speed 9200, power 6, PI 3.5, 16 PI events.   Objective:    Vital Signs:   Temp:  [98.2 F (36.8 C)-99 F (37.2 C)] 98.2 F (36.8 C) (05/24 0316) Pulse Rate:  [84-114] 84 (05/24 0316) Resp:  [15-18] 16 (05/24 0316) BP: (65-146)/(44-77) 75/60 (05/23 2327) SpO2:  [91 %-95 %] 93 % (05/24 0316) Weight:  [197 lb 5 oz (89.5 kg)] 197 lb 5 oz (89.5 kg) (05/24 0500) Last BM Date: 03/26/18 Mean arterial Pressure 70s-80s  Intake/Output:   Intake/Output Summary (Last 24 hours) at 03/27/2018 0709 Last data filed at 03/27/2018 0300 Gross per 24 hour  Intake 1570 ml  Output -  Net 1570 ml     Physical Exam   GENERAL: Well appearing this am. NAD.  HEENT: Normal. NECK: Supple, JVP 7-8 cm. Carotids OK.  CARDIAC:  Mechanical heart sounds with LVAD hum present.  LUNGS:  CTAB, normal effort.  ABDOMEN:  NT, ND, no HSM. No bruits or masses. +BS  LVAD exit site: Dressing dry and intact. No erythema or drainage. Stabilization device present and accurately applied. Driveline dressing changed daily per sterile technique. EXTREMITIES:  Warm and dry. No cyanosis, clubbing, rash. BLE with TED hose. Sore to left shin NEUROLOGIC:  Alert &  oriented x 3. Cranial nerves grossly intact. Moves all 4 extremities w/o difficulty. Affect pleasant    Telemetry   Afib 90s Personally reviewed.   Labs   Basic Metabolic Panel: Recent Labs  Lab 03/21/18 0815 03/22/18 1610 03/23/18 0108 03/24/18 0228 03/25/18 0221 03/26/18 0258 03/27/18 0309  NA 135 134* 135 136 135 136 134*  K 4.3 4.1 3.6 3.6 3.7 4.0 4.5  CL 100* 100* 98* 99* 99* 101 100*  CO2 27 25 30 30 29 29 27   GLUCOSE 75 166* 93  145* 141* 222* 214*  BUN 28* 36* 20 12 22* 14 22*  CREATININE 4.73* 5.46* 3.67* 2.48* 3.51* 3.04* 4.15*  CALCIUM 7.6* 7.2* 7.3* 7.2* 7.1* 7.1* 7.1*  MG  --   --   --  1.5* 1.8 1.9  --   PHOS 2.7 2.3*  --   --   --  2.2* 3.2    Liver Function Tests: Recent Labs  Lab 03/21/18 0815 03/22/18 0608 03/26/18 0258 03/27/18 0309  ALBUMIN 2.4* 2.1* 2.2* 2.1*   No results for input(s): LIPASE, AMYLASE in the last 168 hours. No results for input(s): AMMONIA in the last 168 hours.  CBC: Recent Labs  Lab 03/22/18 1206 03/23/18 0108 03/24/18 0228  03/25/18 0221 03/25/18 1938 03/26/18 0258 03/26/18 1602 03/27/18 0309  WBC 9.6 8.6 9.7  --  9.5  --  7.9  --  9.4  NEUTROABS 7.7  --   --   --   --   --   --   --   --   HGB 8.0* 8.1* 7.9*   < > 8.1* 9.8* 7.9* 8.6* 7.5*  HCT 24.6* 25.0* 24.0*   < > 24.8* 29.3* 24.1* 26.5* 23.1*  MCV 86.9 88.0 87.3  --  90.2  --  88.9  --  90.2  PLT 162 155 155  --  149*  --  120*  --  137*   < > = values in this interval not displayed.    INR: Recent Labs  Lab 03/23/18 0108 03/24/18 0228 03/25/18 0221 03/26/18 0258 03/27/18 0309  INR 1.17 1.10 1.15 1.23 1.27    Other results:  EKG:    Imaging   Dg Knee Left Port  Result Date: 03/26/2018 CLINICAL DATA:  Left knee pain and swelling. EXAM: PORTABLE LEFT KNEE - 1-2 VIEW COMPARISON:  No recent prior. FINDINGS: Prominent knee joint effusion. Diffuse tricompartment degenerative change left knee. Loose bodies may be present. No acute bony or joint abnormality identified. No evidence of fracture dislocation. Peripheral vascular calcification noted. Surgical clip noted over the medial soft tissues. IMPRESSION: 1. Prominent knee joint effusion. Tricompartment degenerative change left knee. Loose bodies may be present. No acute bony abnormality identified. 2.  Peripheral vascular disease. Electronically Signed   By: Marcello Moores  Register   On: 03/26/2018 10:11     Medications:     Scheduled  Medications: . atorvastatin  40 mg Oral q1800  . busPIRone  5 mg Oral BID  . calcitRIOL  0.5 mcg Oral Q M,W,F-HD  . Chlorhexidine Gluconate Cloth  6 each Topical Q0600  . danazol  100 mg Oral BID  . darbepoetin (ARANESP) injection - DIALYSIS  100 mcg Intravenous Q Mon-HD  . gabapentin  300 mg Oral BID  . insulin aspart  0-9 Units Subcutaneous TID WC  . insulin glargine  8 Units Subcutaneous QHS  . levothyroxine  25 mcg Oral QAC breakfast  . magnesium oxide  400 mg Oral Daily  .  mometasone-formoterol  2 puff Inhalation BID  . multivitamin  1 tablet Oral QHS  . pantoprazole  40 mg Oral Daily  . sildenafil  40 mg Oral TID    Infusions: . octreotide  (SANDOSTATIN)    IV infusion 50 mcg/hr (03/27/18 0300)    PRN Medications: acetaminophen, albuterol, diphenhydrAMINE, ondansetron (ZOFRAN) IV, oxyCODONE-acetaminophen, traMADol, traZODone   Assessment/Plan:    1. GI Bleed/Symptomatic Anemia: GI AVMs. Double balloon retro endoscopy at [facility], unable to identify bleeding lesion.  He had failed attempted IR embolization x 3 while at [facility]. 12 units PRBCs at [facility]. He has had multiple units PRBCs here. Still with rectal bleeding, had more overnight.  - Received 2U PRBCs 5/19. 5.5>8.1 Given 1U PRBCs 5/21. 2U PRBCs 5/22. 1U PRBC 5/23. Hemoglobin 7.5 today => transfuse 2 units today.  - INR 1.27 - LDH 174>223>224>206>185>186. Baseline LDH 200s.   - Continue octreotide drip and continue danazol 100 mg bid.  - IR consulted, but did not find any active bleeding on CTA 5/20 - GI consulted. Repeat capsule study showed active bleeding in the mid ileum.  - Surgery consulted and recommended transfer back to [facility] for intra-operative endoscopy and surgical resection once source is found 2. Chronic Systolic Heart Failure/RV Failure: HMIII LVAD placed 2017.  VAD parameters stable this morning. - LDH stable.   - Volume managed by HD. HD today.  - No bb with RV failure. Continue sildenafil 40 mg tid.  -  Continue TED hose 3. ESRD- M/W/F/Sat. HD today 4. Chronic A fib: Off coumadin with GI bleed. No change 5. HTN: doppler MAPs 60s-90s 6. Squamous Cell CA: s/p excision, margins at neck site not clear but has not wanted radiation. Left shin sore from excision. WOCN consulted for recommendations.  7. DMII - Continue SSI. No change 8. H/O CAD CABG. No s/s ischemia 9. H/O TAVR: Valve stable on last echo. No change.  10. NSVT - K 4.5 - Likely related to volume depletion from blood loss.  11. Left knee pain  - Uric acid 3.3 - Xray showed effusion. Ortho consulted and aspirated + steroid injection  I reviewed the LVAD parameters from today, and compared the results to the patient's prior recorded data.  No programming changes were made.  The LVAD is functioning within specified parameters.  The patient performs LVAD self-test daily.  LVAD interrogation was negative for any significant power changes, alarms or PI events/speed drops.  LVAD equipment check completed and is in good working order.  Back-up equipment present.   LVAD education done on emergency procedures and precautions and reviewed exit site care.  Length of Stay: Hin[place], NP 03/27/2018, 7:09 AM  VAD Team --- VAD ISSUES ONLY--- Pager [phone] (7am - 7am)  Advanced Heart Failure Team  Pager [phone] (M-F; 7a - 4p)  Please contact [facility] for night-coverage after hours (4p -7a ) and weekends on [email or web address]  Patient seen with NP, agree with the above note. Still with hematochezia. He had 1 unit PRBCs yesterday, hgb 7.5 today.He had CTA 5/20with IR, no active bleeding source noted so nointervention. Capsule endoscopy completed, showed active bleeding in the mid ileum.  He was seen by general surgery yesterday again, recommend transfer back to [facility] for intra-operative endoscopy and attempted resection. Given ongoing bleeding and inability to treat via endoscopy or IR, this appears to be our only viable option.  -  Will transfuse 2 units today with ongoing bleeding.  -Continue octreotide gtt. -Now ondanazol. -  I will meet with Mr Georg and his family around noon.  We will discuss options.  I think he is going to opt to see if he can go back to [facility] for possible surgical treatment.  I will then contact [name] to arrange for transfer and discuss timing.   We have him off all anticoagulation with active bleeding. There is certainly increased risk for pump thrombosis but we really have no other option at this point.LDH stable.  Left knee pain/swelling after PT, had steroid injection and aspiration yesterday by orthopedics, knee feels much better today.   He will have HD this afternoon for volume removal.   Loralie Champagne 03/27/2018 9:25 AM   I have spoken this afternoon with Mr Kleinsasser and his sisters.  He is willing to undergo surgery if needed to stop his GI bleeding.  I have spoken with Dr. Erick Alley at [facility] and faxed him our latest capsule endoscopy.  He is going to talk with GI and surgery at [facility] to determine if they think he would be a candidate for further endoscopic or surgical procedures.  He will contact us when a decision regarding accepting the transfer is made.   We are going to try a dose of premarin to see if this can help with the bleeding.  Some evidence for its use though concerned about thrombotic risk.   Loralie Champagne 03/27/2018 12:53 PM

## 2018-03-27 NOTE — Discharge Summary (Addendum)
Advanced Heart Failure Clinic Note  Discharge Summary   Patient ID: Trevor [name] MRN: [id number], DOB/AGE: 73/05/1946 73 y.o. Admit date: 03/20/2018 D/C date:     04/01/2018    Primary Discharge Diagnoses:  1. GI Bleed/Symptomatic Anemia 2. Chronic Systolic Heart Failure/RV Failure: HMIII LVAD placed 2017.   VAD Speed 9200  No coumadin ASA with bleed 3. ESRD- M/W/F/Sat.   Last HD 04/01/18  4. Chronic A fib 5. HTN 6. Squamous Cell CA: s/p excision 7. DMII 8. H/O CAD CABG 9. H/O TAVR 10. NSVT 11. Left knee pain s/p aspiration and steroid injection per ortho  Started on prednisone 03/31/18  Hospital Course:   Trevor [name] is a 72 y.o. malewith a history of CAD s/p CABG x 4 2010, OSA, AS with TAVR 2015, ,ESRD on HD,DM2,chronicatrial fibrillation, asthma,GI bleed,and ischemic cardiomyopathy with [employer] ICD.S/PHMII LVAD for Destination therapy on 12/14/2015.  Previously admitted 03/09/2018 with recurrent GI bleed. Coumadin was stopped. He received 3UPRBCs. Transferred to [facility] for deep enteroscopy on 03/10/2018. Hospital course at [facility] included double retro ballon scope and he was sent for IR embolization x 3 but he had no evidence of bleeding. Received 12 UPRBCs. He remained off anticoagulants.   Transferred back to [facility] with no option for surgery at that time. Work up has continued. He has had  an additional 15 uPRBCs as of 04/01/18. CTA 03/23/18 with no active bleeding, however repeat capsule study 03/25/18 showed active bleeding in mid illeum. General surgery team consulted and plan for transfer back to [facility] for intra-operative endoscopy and surgical resection once source is found.   VAD parameters have remained stable for the most part this admission. LDH has been stable despite holding AC. Renal has followed closely to keep on M/W/F/Sat HD schedule. He received dialysis on 04/01/2018.   Hospital course additionally complicated by Left knee pain. Ortho consulted. DG knee  03/26/18 showed effusion. Ortho aspirated 87 ml of fluid and gave steroid injection. Ortho reconsulted on 5/28 due to recurrent pain. Prednisone was started on 03/31/18 and will receive a total of 5 doses.      Examined today, 04/01/2018, and thought stable for discharge to [facility] for consideration of surgery. Follow up care will be closely arranged in tandem with [name] VAD Team and [facility] VAD Team.  Dr Aundra Dubin discussed with Dr Posey Pronto at [facility] and Dr Weyman Croon will be the accepting physician at [facility].   GI events 2018 colonoscopy. Blood throughout colon, but not in TI. Oozing tiny AVMS at hepatic flexure, treated with APC, endoclips. Three small small polyps not removed. Left colon tics.  02/27/18 EGD. Normal, no bleeding.  02/27/18 Colonoscopy. Blood in TI, sugg of SB source. Blood throughout colon, no source found. Descending and sigmoid tics. Multiple small polyps of ascending to sigmoid, not removed.  02/28/18 Capsule endo. Fresh blood at ileum, starting at 6 hours 58 mins. Blood obscured identification of lesion. Blood throughout colon. Per Dr Marin Shutter "Ileum (where bleeding) not reachable locally, and likely not reachable by anterograde double balloon enteroscopy at [facility]".Transfused 5 U PRBCs during admission.  At discharge continued on monthly octreotide and started Doxycycline 100 bid to try to limit AVM bleeding (and for URI sx)with plannedtransition to danazol. Goal INR decreased to 1.8 - 2.3 03/09/18: Admitted with recurrent GI bleed. Coumadin was stopped. He received 3UPRBCs. Transferred to [facility] for deep entersospcopy on 03/10/2018. Hospital course at [facility] double retro ballon scope and he was sent for IR embolization x 3but he had  no evidence of bleeding. Received12 UPRBCs. He remained off anticoagulants. 03/25/18: Repeat capsule endoscopy completed. Bleeding appears to come from the mid-ileum.    LVAD Interrogation HM 2: Speed:9200 Flow: 5.6 PI: 4 Power: 5.8   Discharge  Weight:  Discharge Vitals: Blood pressure (!) 82/63, pulse 61, temperature 98.7 F (37.1 C), temperature source Oral, resp. rate 18, height 5\' 5"  (1.651 m), weight 199 lb 11.8 oz (90.6 kg), SpO2 97 %.  Labs: Lab Results  Component Value Date   WBC 8.6 04/01/2018   HGB 7.6 (L) 04/01/2018   HCT 23.2 (L) 04/01/2018   MCV 86.2 04/01/2018   PLT 126 (L) 04/01/2018    Recent Labs  Lab 04/01/18 0333  NA 135  K 4.8  CL 100*  CO2 27  BUN 30*  CREATININE 4.52*  CALCIUM 7.1*  GLUCOSE 237*   Lab Results  Component Value Date   CHOL 59 12/04/2016   HDL 30 (L) 12/04/2016   LDLCALC 15 12/04/2016   TRIG 68 12/04/2016   BNP (last 3 results) No results for input(s): BNP in the last 8760 hours.  ProBNP (last 3 results) No results for input(s): PROBNP in the last 8760 hours.   Diagnostic Studies/Procedures   Ct Head Wo Contrast  Result Date: 04/01/2018 CLINICAL DATA:  Speech difficulty EXAM: CT HEAD WITHOUT CONTRAST TECHNIQUE: Contiguous axial images were obtained from the base of the skull through the vertex without intravenous contrast. COMPARISON:  None. FINDINGS: Brain: Mild atrophy. Negative for hydrocephalus. Negative for acute infarct, hemorrhage, or mass lesion. Mild chronic appearing white matter changes. Vascular: Negative for hyperdense vessel. Extensive calcification right M1 segment. Skull: Negative Sinuses/Orbits: Negative Other: None IMPRESSION: No acute intracranial abnormality Atherosclerotic calcification with the large calcified plaque in the right M1 segment. Electronically Signed   By: Franchot Gallo M.D.   On: 04/01/2018 14:05    Discharge Medications   Allergies as of 04/01/2018   No Known Allergies     Medication List    STOP taking these medications   AURYXIA 1 GM 210 MG(Fe) tablet Generic drug:  ferric citrate   docusate sodium 100 MG capsule Commonly known as:  COLACE   doxycycline 100 MG tablet Commonly known as:  VIBRA-TABS   EPOGEN 4000 UNIT/ML  injection Generic drug:  epoetin alfa   insulin glargine 100 unit/mL Sopn Commonly known as:  LANTUS Replaced by:  insulin glargine 100 UNIT/ML injection   sevelamer carbonate 800 MG tablet Commonly known as:  RENVELA     TAKE these medications   acetaminophen 325 MG tablet Commonly known as:  TYLENOL Take 2 tablets (650 mg total) by mouth every 4 (four) hours as needed for headache or mild pain.   albuterol 108 (90 Base) MCG/ACT inhaler Commonly known as:  PROVENTIL HFA;VENTOLIN HFA Inhale 2 puffs into the lungs every 4 (four) hours as needed for wheezing or shortness of breath.   atorvastatin 40 MG tablet Commonly known as:  LIPITOR TAKE 1 TABLET(40 MG) BY MOUTH DAILY What changed:    how much to take  how to take this  when to take this  additional instructions  Another medication with the same name was removed. Continue taking this medication, and follow the directions you see here.   busPIRone 5 MG tablet Commonly known as:  BUSPAR Take 1 tablet (5 mg total) by mouth 2 (two) times daily.   calcitRIOL 0.5 MCG capsule Commonly known as:  ROCALTROL Take 1 capsule (0.5 mcg total) by mouth every  Monday, Wednesday, and Friday with hemodialysis.   danazol 100 MG capsule Commonly known as:  DANOCRINE Take 1 capsule (100 mg total) by mouth 2 (two) times daily.   Darbepoetin Alfa 200 MCG/0.4ML Sosy injection Commonly known as:  ARANESP Inject 0.4 mLs (200 mcg total) into the vein every Monday with hemodialysis. Start taking on:  04/06/2018   diphenhydrAMINE 25 mg capsule Commonly known as:  BENADRYL Take 1 capsule (25 mg total) by mouth as needed for allergies (30 min prior to blood transfusion).   fluticasone 110 MCG/ACT inhaler Commonly known as:  FLOVENT HFA Inhale 2 puffs into the lungs 2 (two) times daily.   gabapentin 600 MG tablet Commonly known as:  NEURONTIN Take 0.5 tablets (300 mg total) by mouth 2 (two) times daily.   guaiFENesin 100 MG/5ML  Soln Commonly known as:  ROBITUSSIN Take 5 mLs (100 mg total) by mouth every 4 (four) hours as needed for cough or to loosen phlegm.   heparin 1000 unit/mL Soln injection 1 mL (1,000 Units total) by Dialysis route as needed (in dialysis).   insulin glargine 100 UNIT/ML injection Commonly known as:  LANTUS Inject 0.08 mLs (8 Units total) into the skin at bedtime. Replaces:  insulin glargine 100 unit/mL Sopn   levothyroxine 25 MCG tablet Commonly known as:  SYNTHROID, LEVOTHROID Take 1 tablet (25 mcg total) by mouth daily before breakfast.   magnesium oxide 400 MG tablet Commonly known as:  MAG-OX Take 400 mg by mouth daily.   metoCLOPramide 10 MG/10ML Soln Commonly known as:  REGLAN Take 5 mLs (5 mg total) by mouth 4 (four) times daily -  before meals and at bedtime.   mometasone-formoterol 200-5 MCG/ACT Aero Commonly known as:  DULERA Inhale 2 puffs into the lungs 2 (two) times daily.   multivitamin Tabs tablet Take 1 tablet by mouth at bedtime.   octreotide 500 mcg in sodium chloride 0.9 % 250 mL Inject 50 mcg/hr into the vein continuous.   ondansetron 4 MG/2ML Soln injection Commonly known as:  ZOFRAN Inject 2 mLs (4 mg total) into the vein every 6 (six) hours as needed for nausea.   pantoprazole 40 MG tablet Commonly known as:  PROTONIX Take 1 tablet (40 mg total) by mouth daily.   predniSONE 20 MG tablet Commonly known as:  DELTASONE Take 3 tablets (60 mg total) by mouth daily with breakfast. Start taking on:  04/02/2018   sildenafil 20 MG tablet Commonly known as:  REVATIO Take 2 tablets (40 mg total) 3 (three) times daily by mouth. What changed:    when to take this  additional instructions   traMADol 50 MG tablet Commonly known as:  ULTRAM Take 1 tablet (50 mg total) by mouth every 12 (twelve) hours as needed. TAKE 1 TABLET BY MOUTH EVERY 6 HOURS AS NEEDED FOR MODERATE PAIN What changed:    when to take this  reasons to take this  additional  instructions   traZODone 100 MG tablet Commonly known as:  DESYREL TAKE 1 TABLET BY MOUTH EVERY NIGHT AT BEDTIME AS NEEDED FOR SLEEP What changed:    how much to take  how to take this  when to take this  additional instructions       Disposition   The patient will be discharged in stable but tenuous condition to [facility].  Discharge Instructions    Diet - low sodium heart healthy   Complete by:  As directed    Increase activity slowly   Complete  by:  As directed    Speed Settings:   Complete by:  As directed    Fixed 9200 RPM Low 8600 RPM   Not on coumadin with GI bleed     Follow-up Information    Wylene Simmer, MD Follow up in 3 week(s).   Specialty:  Orthopedic Surgery Contact information: [address] [place] [address] [phone]        [facility] Follow up.   Specialty:  Cardiology Why:  To be arranged pending discharge from [facility].  Contact information: [address] [id number] [place] [phone]            Duration of Discharge Encounter: Greater than 35 minutes   Signed, Darrick Grinder, NP  04/01/2018, 2:23 PM

## 2018-03-28 ENCOUNTER — Inpatient Hospital Stay (HOSPITAL_COMMUNITY): Payer: Medicare Other

## 2018-03-28 DIAGNOSIS — N186 End stage renal disease: Secondary | ICD-10-CM

## 2018-03-28 DIAGNOSIS — Z515 Encounter for palliative care: Secondary | ICD-10-CM

## 2018-03-28 DIAGNOSIS — K567 Ileus, unspecified: Secondary | ICD-10-CM

## 2018-03-28 LAB — BPAM RBC
BLOOD PRODUCT EXPIRATION DATE: 201905272359
BLOOD PRODUCT EXPIRATION DATE: 201906132359
BLOOD PRODUCT EXPIRATION DATE: 201906132359
Blood Product Expiration Date: 201905272359
Blood Product Expiration Date: 201906102359
Blood Product Expiration Date: 201906142359
Blood Product Expiration Date: 201906142359
ISSUE DATE / TIME: 201905211121
ISSUE DATE / TIME: 201905221456
ISSUE DATE / TIME: 201905241018
ISSUE DATE / TIME: 201905211736
ISSUE DATE / TIME: 201905221141
ISSUE DATE / TIME: 201905231042
ISSUE DATE / TIME: 201905241540
UNIT TYPE AND RH: 600
UNIT TYPE AND RH: 600
UNIT TYPE AND RH: 600
UNIT TYPE AND RH: 600
Unit Type and Rh: 600
Unit Type and Rh: 600
Unit Type and Rh: 600

## 2018-03-28 LAB — TYPE AND SCREEN
ABO/RH(D): A NEG
ANTIBODY SCREEN: NEGATIVE
UNIT DIVISION: 0
UNIT DIVISION: 0
UNIT DIVISION: 0
Unit division: 0
Unit division: 0
Unit division: 0
Unit division: 0

## 2018-03-28 LAB — RENAL FUNCTION PANEL
ANION GAP: 8 (ref 5–15)
Albumin: 2.3 g/dL — ABNORMAL LOW (ref 3.5–5.0)
BUN: 15 mg/dL (ref 6–20)
CO2: 29 mmol/L (ref 22–32)
Calcium: 7.1 mg/dL — ABNORMAL LOW (ref 8.9–10.3)
Chloride: 102 mmol/L (ref 101–111)
Creatinine, Ser: 2.87 mg/dL — ABNORMAL HIGH (ref 0.61–1.24)
GFR calc non Af Amer: 20 mL/min — ABNORMAL LOW (ref >60)
GFR, EST AFRICAN AMERICAN: 24 mL/min — AB (ref >60)
GLUCOSE: 124 mg/dL — AB (ref 65–99)
PHOSPHORUS: 2.6 mg/dL (ref 2.5–4.6)
POTASSIUM: 3.6 mmol/L (ref 3.5–5.1)
Sodium: 139 mmol/L (ref 135–145)

## 2018-03-28 LAB — GLUCOSE, CAPILLARY
GLUCOSE-CAPILLARY: 143 mg/dL — AB (ref 65–99)
GLUCOSE-CAPILLARY: 168 mg/dL — AB (ref 65–99)
Glucose-Capillary: 129 mg/dL — ABNORMAL HIGH (ref 65–99)
Glucose-Capillary: 169 mg/dL — ABNORMAL HIGH (ref 65–99)

## 2018-03-28 LAB — CBC
HEMATOCRIT: 25.6 % — AB (ref 39.0–52.0)
HEMOGLOBIN: 8.5 g/dL — AB (ref 13.0–17.0)
MCH: 30.1 pg (ref 26.0–34.0)
MCHC: 33.2 g/dL (ref 30.0–36.0)
MCV: 90.8 fL (ref 78.0–100.0)
Platelets: 132 10*3/uL — ABNORMAL LOW (ref 150–400)
RBC: 2.82 MIL/uL — ABNORMAL LOW (ref 4.22–5.81)
RDW: 17.1 % — ABNORMAL HIGH (ref 11.5–15.5)
WBC: 13.6 10*3/uL — ABNORMAL HIGH (ref 4.0–10.5)

## 2018-03-28 LAB — LACTATE DEHYDROGENASE: LDH: 192 U/L (ref 98–192)

## 2018-03-28 LAB — PROTIME-INR
INR: 1.34
Prothrombin Time: 16.5 seconds — ABNORMAL HIGH (ref 11.4–15.2)

## 2018-03-28 MED ORDER — METOCLOPRAMIDE HCL 10 MG/10ML PO SOLN
5.0000 mg | Freq: Three times a day (TID) | ORAL | Status: DC
  Administered 2018-03-28 – 2018-04-01 (×13): 5 mg via ORAL
  Filled 2018-03-28 (×33): qty 5

## 2018-03-28 MED ORDER — METOCLOPRAMIDE HCL 5 MG/5ML PO SOLN
5.0000 mg | Freq: Three times a day (TID) | ORAL | Status: DC
Start: 2018-03-28 — End: 2018-03-28
  Filled 2018-03-28: qty 5

## 2018-03-28 NOTE — Progress Notes (Signed)
Per pharmacy ok to give medications via NT tube. Will hold low interm. Suction until 2300.  Saunders Revel T

## 2018-03-28 NOTE — Progress Notes (Signed)
Patient ID: Trevor [name], male   DOB: 07/04/1946, 72 y.o.   MRN: [id number]   Advanced Heart Failure VAD Team Note  PCP-Cardiologist: No primary care provider on file.   Subjective:    GI events  2018 colonoscopy. Blood throughout colon, but not in TI. Oozing tiny AVMS at hepatic flexure, treated with APC, endoclips. Three small small polyps not removed. Left colon tics.  02/27/18 EGD. Normal, no bleeding.  02/27/18 Colonoscopy. Blood in TI, sugg of SB source. Blood throughout colon, no source found. Descending and sigmoid tics. Multiple small polyps of ascending to sigmoid, not removed.  02/28/18 Capsule endo. Fresh blood at ileum, starting at 6 hours 58 mins. Blood obscured identification of lesion. Blood throughout colon. Per Dr Marin Shutter "Ileum (where bleeding) not reachable locally, and likely not reachable by anterograde double balloon enteroscopy at [facility]".Transfused 5 U PRBCs during admission.  At discharge continued on monthly octreotide and started Doxycycline 100 bid to try to limit AVM bleeding (and for URI sx)with plannedtransition to danazol. Goal INR decreased to 1.8 - 2.3 03/09/18: Admitted with recurrent GI bleed. Coumadin was stopped. He received 3UPRBCs. Transferred to [facility] for deep entersospcopy on 03/10/2018. Hospital course at [facility] included double retro ballon scope and he was sent for  IR embolization x 3 but he had no evidence of bleeding. Received 12 UPRBCs. He remained off anticoagulants.    Seen by IR 5/20. CTA obtained, but no active bleeding found. Started on octreotide.  03/25/18: Repeat capsule endoscopy completed. Bleeding appears to come from the mid-ileum.  03/26/18: General surgery reconsulted. Recommended transfer back to [facility] for surgery with intraoperative endoscopy. [name] currently discussing options.  Ortho consulted 5/24 for left knee pain. S/p knee aspiration and steroid injection.   Remains off warfarin , INR 1.34  Dose of premarin given  5/24. Continue to have bleeding. Hgb down 8.6-> 7.5.. LDH 186. Very bloated. Unable to eat. Having nausea and vomitting. Ab markedly distended. No CP or SOB. Still BRPR. Having some formed stool as well.   RBCs 5/20 2U 5/21 1U  5/22 1U  5/23 1U 5/24 2U   LVAD INTERROGATION:  HeartMate 2 LVAD:   Flow 5.7  liters/min, speed 9200, power 6, PI 3.7, occasional PI events    Objective:    Vital Signs:   Temp:  [97.8 F (36.6 C)-98.5 F (36.9 C)] 98.5 F (36.9 C) (05/25 0853) Pulse Rate:  [50-112] 97 (05/25 0853) Resp:  [16-22] 20 (05/25 0853) BP: (75-149)/(15-118) 95/72 (05/25 0853) SpO2:  [93 %-99 %] 93 % (05/25 0853) Weight:  [89 kg (196 lb 3.2 oz)-92.8 kg (204 lb 9.4 oz)] 89 kg (196 lb 3.2 oz) (05/25 0321) Last BM Date: 03/27/18 Mean arterial Pressure 70s-80 s  Intake/Output:   Intake/Output Summary (Last 24 hours) at 03/28/2018 1139 Last data filed at 03/28/2018 0840 Gross per 24 hour  Intake 1670 ml  Output 3000 ml  Net -1330 ml     Physical Exam   General:  NAD. Weak, ill-appearing HEENT: normal  Neck: supple. JVP 10 Carotids 2+ bilat; no bruits. No lymphadenopathy or thryomegaly appreciated. Cor: LVAD hum.  Lungs: Clear. Abdomen: obese soft, nontender, markedly distended. No hepatosplenomegaly. No bruits or masses. Hypoactive bowel sounds. Driveline site clean. Anchor in place.  Extremities: no cyanosis, clubbing, rash. 1-2+ edema  Left AV fistula  Neuro: alert & oriented x 3. No focal deficits. Moves all 4 without problem    Telemetry   Afib 80-90s Personally reviewed.   Labs  Basic Metabolic Panel: Recent Labs  Lab 03/22/18 0608  03/24/18 0228 03/25/18 0221 03/26/18 0258 03/27/18 0309 03/28/18 0513  NA 134*   < > 136 135 136 134* 139  K 4.1   < > 3.6 3.7 4.0 4.5 3.6  CL 100*   < > 99* 99* 101 100* 102  CO2 25   < > 30 29 29 27 29   GLUCOSE 166*   < > 145* 141* 222* 214* 124*  BUN 36*   < > 12 22* 14 22* 15  CREATININE 5.46*   < > 2.48* 3.51*  3.04* 4.15* 2.87*  CALCIUM 7.2*   < > 7.2* 7.1* 7.1* 7.1* 7.1*  MG  --   --  1.5* 1.8 1.9  --   --   PHOS 2.3*  --   --   --  2.2* 3.2 2.6   < > = values in this interval not displayed.    Liver Function Tests: Recent Labs  Lab 03/22/18 0608 03/26/18 0258 03/27/18 0309 03/28/18 0513  ALBUMIN 2.1* 2.2* 2.1* 2.3*   No results for input(s): LIPASE, AMYLASE in the last 168 hours. No results for input(s): AMMONIA in the last 168 hours.  CBC: Recent Labs  Lab 03/22/18 1206  03/24/18 0228  03/25/18 0221  03/26/18 0258 03/26/18 1602 03/27/18 0309 03/27/18 2046 03/28/18 0513  WBC 9.6   < > 9.7  --  9.5  --  7.9  --  9.4  --  13.6*  NEUTROABS 7.7  --   --   --   --   --   --   --   --   --   --   HGB 8.0*   < > 7.9*   < > 8.1*   < > 7.9* 8.6* 7.5* 9.0* 8.5*  HCT 24.6*   < > 24.0*   < > 24.8*   < > 24.1* 26.5* 23.1* 27.4* 25.6*  MCV 86.9   < > 87.3  --  90.2  --  88.9  --  90.2  --  90.8  PLT 162   < > 155  --  149*  --  120*  --  137*  --  132*   < > = values in this interval not displayed.    INR: Recent Labs  Lab 03/24/18 0228 03/25/18 0221 03/26/18 0258 03/27/18 0309 03/28/18 0513  INR 1.10 1.15 1.23 1.27 1.34    Other results:  EKG:    Imaging   No results found.   Medications:     Scheduled Medications: . atorvastatin  40 mg Oral q1800  . busPIRone  5 mg Oral BID  . calcitRIOL  0.5 mcg Oral Q M,W,F-HD  . Chlorhexidine Gluconate Cloth  6 each Topical Q0600  . danazol  100 mg Oral BID  . darbepoetin (ARANESP) injection - DIALYSIS  100 mcg Intravenous Q Mon-HD  . gabapentin  300 mg Oral BID  . insulin aspart  0-9 Units Subcutaneous TID WC  . insulin glargine  8 Units Subcutaneous QHS  . levothyroxine  25 mcg Oral QAC breakfast  . magnesium oxide  400 mg Oral Daily  . mometasone-formoterol  2 puff Inhalation BID  . multivitamin  1 tablet Oral QHS  . pantoprazole  40 mg Oral Daily  . sildenafil  40 mg Oral TID    Infusions: . sodium chloride      . sodium chloride    . octreotide  (SANDOSTATIN)    IV  infusion 50 mcg/hr (03/28/18 0800)    PRN Medications: sodium chloride, sodium chloride, acetaminophen, albuterol, alteplase, diphenhydrAMINE, heparin, lidocaine (PF), lidocaine-prilocaine, ondansetron (ZOFRAN) IV, oxyCODONE-acetaminophen, pentafluoroprop-tetrafluoroeth, traMADol, traZODone   Assessment/Plan:    1. GI Bleed/Symptomatic Anemia: GI AVMs. Double balloon retro endoscopy at [facility], unable to identify bleeding lesion.  He had failed attempted IR embolization x 3 while at [facility]. 12 units PRBCs at [facility]. He has had multiple units PRBCs here. Still with rectal bleeding, had more overnight.  - Received 2U PRBCs 5/19. 5.5>8.1 Given 1U PRBCs 5/21. 2U PRBCs 5/22. 1U PRBC 5/23. 2U 5/24 - still bleeding. Hgb 8.5 today. Will not transfuse today - INR 1.24 - LDH 174>223>224>206>185>186> 192 Baseline LDH 200s.   - Continue octreotide drip and continue danazol 100 mg bid. Received premarin 5/24 - IR consulted, but did not find any active bleeding on CTA 5/20 - GI consulted. Repeat capsule study showed active bleeding in the mid ileum.  - Surgery consulted and recommended transfer back to [facility] for intra-operative endoscopy and surgical resection once source is found. [name] has d/w [name] still discussing internally.  2. Chronic Systolic Heart Failure/RV Failure: HMIII LVAD placed 2017.  VAD parameters stable this morning. - LDH stable.   - Volume managed by HD. Remains overloaded today weight up several pounds   S/p HD 5/24. Renal following  - No bb with RV failure. Continue sildenafil 40 mg tid.  - Continue TED hose 3. ESRD- M/W/F/Sat. HD per Renal . 4. Chronic A fib: Off coumadin with GI bleed. No change 5. HTN: doppler MAPs 80s-90s 6. Squamous Cell CA: s/p excision, margins at neck site not clear but has not wanted radiation. Left shin sore from excision. WOCN consulted for recommendations.  7. DMII - Continue SSI. No  change 8. H/O CAD CABG. No s/s ischemia 9. H/O TAVR: Valve stable on last echo. No change.  10. NSVT - K 3.6 11. Left knee pain  - Uric acid 3.3 - Xray showed effusion. Ortho consulted and aspirated + steroid injection 5/24 12. Abdominal distension - seems to have ileus (versus high stool burden but with ongoing bleeding would expect blood to serve as a good laxative) - stat KUB - start reglan. - may need NGT decompression  I reviewed the LVAD parameters from today, and compared the results to the patient's prior recorded data.  No programming changes were made.  The LVAD is functioning within specified parameters.  The patient performs LVAD self-test daily.  LVAD interrogation was negative for any significant power changes, alarms or PI events/speed drops.  LVAD equipment check completed and is in good working order.  Back-up equipment present.   LVAD education done on emergency procedures and precautions and reviewed exit site care.  Length of Stay: [place], MD 03/28/2018, 11:39 AM  VAD Team --- VAD ISSUES ONLY--- Pager [phone] (7am - 7am)  Advanced Heart Failure Team  Pager [phone] (M-F; 7a - 4p)  Please contact [facility] for night-coverage after hours (4p -7a ) and weekends on [email or web address]

## 2018-03-28 NOTE — Progress Notes (Signed)
Consult received for Trevor [name] discussion. Went by to see pt. Requesting that we meet 5/26 at 10am secondary to pain and nausea. Arranged to have his sisters, [name] and Altha Harm at the meeting. Thank you,  Romona Curls, ANP

## 2018-03-28 NOTE — Progress Notes (Signed)
Ng tube placed via another Therapist, sports.  Stat abd xray ordered.  Pt tolerated fair.  Will continue to monitor Saunders Revel T

## 2018-03-28 NOTE — Progress Notes (Signed)
[name] NEPHROLOGY PROGRESS NOTE  Assessment/ Plan: Pt is a 72 y.o. yo male  with ESRD on hemodialysis MWF at [facility]. He has complex past medical history of CAD, CABG, AS S/P TAVR, ischemic cardiomyopathy with ICD, S/P LVAD placement, here with recurrent GI bleed.Coumadin was stopped. He received blood transfusion.  Transferred to [facility] for deep entersospcopy on 03/10/2018. Hospital course at [facility] double retro ballon scope and he was sent for IR embolization x 3but he had no evidence of bleeding.  Assessment/Plan:  #Anemia/GI bleed: Status post multiple red blood cell transfusion.  Capsule endoscopy with possible mid ileum bleeding.  Evaluation ongoing by general surgery and GI, likely transfer to [facility] for further evaluation.  No improvement in GI bleed.   On Aranesp 100 micrograms weekly.  # ESRD: Monday Wednesday Friday.   -Tolerated dialysis well yesterday with ultrafiltration of 3 kg.  No need for dialysis today.  Plan for next dialysis on Monday.  Patient has chronic hypotension.  Need to monitor blood pressure closely.  He is asymptomatic.   Left AV fistula for access  # Secondary hyperparathyroidism: Phosphorus level low therefore discontinued Renvela.  continue calcitriol.  Monitor labs.  #CAD, hx CABG, LVAD/ afib per cardiology  #Left knee pain: Evaluated by orthopedics, status post arthrocentesis.  Feeling better today.  Subjective: Seen and examined at bedside.  No improvement in GI bleed.  Denied nausea vomiting chest pain or shortness of breath. Objective Vital signs in last 24 hours: Vitals:   03/27/18 2338 03/28/18 0316 03/28/18 0321 03/28/18 0853  BP:   104/70 95/72  Pulse: 81 81  97  Resp: 18 16  20   Temp: 98 F (36.7 C) 98.5 F (36.9 C)  98.5 F (36.9 C)  TempSrc: Oral Oral  Oral  SpO2: 98% 99%  93%  Weight:   89 kg (196 lb 3.2 oz)   Height:       Weight change: 3.3 kg (7 lb 4.4 oz)  Intake/Output Summary (Last 24  hours) at 03/28/2018 1049 Last data filed at 03/28/2018 0800 Gross per 24 hour  Intake 1430 ml  Output 3000 ml  Net -1570 ml       Labs: Basic Metabolic Panel: Recent Labs  Lab 03/26/18 0258 03/27/18 0309 03/28/18 0513  NA 136 134* 139  K 4.0 4.5 3.6  CL 101 100* 102  CO2 29 27 29   GLUCOSE 222* 214* 124*  BUN 14 22* 15  CREATININE 3.04* 4.15* 2.87*  CALCIUM 7.1* 7.1* 7.1*  PHOS 2.2* 3.2 2.6   Liver Function Tests: Recent Labs  Lab 03/26/18 0258 03/27/18 0309 03/28/18 0513  ALBUMIN 2.2* 2.1* 2.3*   No results for input(s): LIPASE, AMYLASE in the last 168 hours. No results for input(s): AMMONIA in the last 168 hours. CBC: Recent Labs  Lab 03/22/18 1206  03/24/18 0228  03/25/18 0221  03/26/18 0258  03/27/18 0309 03/27/18 2046 03/28/18 0513  WBC 9.6   < > 9.7  --  9.5  --  7.9  --  9.4  --  13.6*  NEUTROABS 7.7  --   --   --   --   --   --   --   --   --   --   HGB 8.0*   < > 7.9*   < > 8.1*   < > 7.9*   < > 7.5* 9.0* 8.5*  HCT 24.6*   < > 24.0*   < > 24.8*   < >  24.1*   < > 23.1* 27.4* 25.6*  MCV 86.9   < > 87.3  --  90.2  --  88.9  --  90.2  --  90.8  PLT 162   < > 155  --  149*  --  120*  --  137*  --  132*   < > = values in this interval not displayed.   Cardiac Enzymes: No results for input(s): CKTOTAL, CKMB, CKMBINDEX, TROPONINI in the last 168 hours. CBG: Recent Labs  Lab 03/27/18 0745 03/27/18 1202 03/27/18 1733 03/27/18 2132 03/28/18 0845  GLUCAP 202* 144* 91 177* 129*    Iron Studies: No results for input(s): IRON, TIBC, TRANSFERRIN, FERRITIN in the last 72 hours. Studies/Results: No results found.  Medications: Infusions: . sodium chloride    . sodium chloride    . octreotide  (SANDOSTATIN)    IV infusion 50 mcg/hr (03/28/18 0800)    Scheduled Medications: . atorvastatin  40 mg Oral q1800  . busPIRone  5 mg Oral BID  . calcitRIOL  0.5 mcg Oral Q M,W,F-HD  . Chlorhexidine Gluconate Cloth  6 each Topical Q0600  . danazol  100 mg  Oral BID  . darbepoetin (ARANESP) injection - DIALYSIS  100 mcg Intravenous Q Mon-HD  . gabapentin  300 mg Oral BID  . insulin aspart  0-9 Units Subcutaneous TID WC  . insulin glargine  8 Units Subcutaneous QHS  . levothyroxine  25 mcg Oral QAC breakfast  . magnesium oxide  400 mg Oral Daily  . mometasone-formoterol  2 puff Inhalation BID  . multivitamin  1 tablet Oral QHS  . pantoprazole  40 mg Oral Daily  . sildenafil  40 mg Oral TID    have reviewed scheduled and prn medications.  Physical Exam: General: Not in distress Heart: Regular rate rhythm S1-S2 normal  Lungs: Clear bilateral, no wheezing or crackle. Abdomen: Abdomen firm, nontender, nondistended Extremities: Lower extremity edema improving. Dialysis Access: Left AV fistula has good thrill and bruit.  Trevor [name] Trevor [name] 03/28/2018,10:49 AM  LOS: 8 days

## 2018-03-29 ENCOUNTER — Inpatient Hospital Stay (HOSPITAL_COMMUNITY): Payer: Medicare Other

## 2018-03-29 DIAGNOSIS — K567 Ileus, unspecified: Secondary | ICD-10-CM

## 2018-03-29 LAB — LACTATE DEHYDROGENASE: LDH: 174 U/L (ref 98–192)

## 2018-03-29 LAB — CBC
HCT: 26.8 % — ABNORMAL LOW (ref 39.0–52.0)
HEMATOCRIT: 23.5 % — AB (ref 39.0–52.0)
Hemoglobin: 7.5 g/dL — ABNORMAL LOW (ref 13.0–17.0)
Hemoglobin: 8.6 g/dL — ABNORMAL LOW (ref 13.0–17.0)
MCH: 29.6 pg (ref 26.0–34.0)
MCH: 29 pg (ref 26.0–34.0)
MCHC: 31.9 g/dL (ref 30.0–36.0)
MCHC: 32.1 g/dL (ref 30.0–36.0)
MCV: 92.9 fL (ref 78.0–100.0)
MCV: 90.2 fL (ref 78.0–100.0)
PLATELETS: 124 10*3/uL — AB (ref 150–400)
PLATELETS: 127 10*3/uL — AB (ref 150–400)
RBC: 2.53 MIL/uL — AB (ref 4.22–5.81)
RBC: 2.97 MIL/uL — ABNORMAL LOW (ref 4.22–5.81)
RDW: 17.7 % — AB (ref 11.5–15.5)
RDW: 17.8 % — AB (ref 11.5–15.5)
WBC: 9.7 10*3/uL (ref 4.0–10.5)
WBC: 9.1 10*3/uL (ref 4.0–10.5)

## 2018-03-29 LAB — BODY FLUID CULTURE: CULTURE: NO GROWTH

## 2018-03-29 LAB — GLUCOSE, CAPILLARY
GLUCOSE-CAPILLARY: 106 mg/dL — AB (ref 65–99)
GLUCOSE-CAPILLARY: 75 mg/dL (ref 65–99)
GLUCOSE-CAPILLARY: 207 mg/dL — AB (ref 65–99)
Glucose-Capillary: 155 mg/dL — ABNORMAL HIGH (ref 65–99)

## 2018-03-29 LAB — PREPARE RBC (CROSSMATCH)

## 2018-03-29 LAB — PROTIME-INR
INR: 1.32
Prothrombin Time: 16.3 seconds — ABNORMAL HIGH (ref 11.4–15.2)

## 2018-03-29 MED ORDER — DIPHENHYDRAMINE HCL 12.5 MG/5ML PO ELIX
25.0000 mg | ORAL_SOLUTION | ORAL | Status: DC | PRN
  Administered 2018-03-29: 25 mg via ORAL
  Filled 2018-03-29: qty 10

## 2018-03-29 MED ORDER — BOOST / RESOURCE BREEZE PO LIQD CUSTOM
1.0000 | Freq: Three times a day (TID) | ORAL | Status: DC
  Administered 2018-03-29 – 2018-03-30 (×3): 1 via ORAL

## 2018-03-29 MED ORDER — SODIUM CHLORIDE 0.9 % IV SOLN
Freq: Once | INTRAVENOUS | Status: AC
  Administered 2018-03-31: 11:00:00 via INTRAVENOUS

## 2018-03-29 MED ORDER — CHLORHEXIDINE GLUCONATE CLOTH 2 % EX PADS: 6.0000 | MEDICATED_PAD | Freq: Every day | CUTANEOUS | Status: DC

## 2018-03-29 NOTE — Progress Notes (Addendum)
Benadryl given via Ng tube.  Vitals obtained.

## 2018-03-29 NOTE — Progress Notes (Signed)
Per LVAD Coordinator/ [name] ok to give 2 units PRBC. Benadryl before .  Type and screen.  And cbc 2 hours after blood given.  Will Notified Md on call. Saunders Revel T

## 2018-03-29 NOTE — Progress Notes (Signed)
Type and screen completed awaiting blood bank.

## 2018-03-29 NOTE — Progress Notes (Signed)
Patient ID: Trevor [name], male   DOB: [birth year], 72 y.o.   MRN: [id number]   Advanced Heart Failure VAD Team Note  PCP-Cardiologist: No primary care provider on file.   Subjective:    GI events  2018 colonoscopy. Blood throughout colon, but not in TI. Oozing tiny AVMS at hepatic flexure, treated with APC, endoclips. Three small small polyps not removed. Left colon tics.  02/27/18 EGD. Normal, no bleeding.  02/27/18 Colonoscopy. Blood in TI, sugg of SB source. Blood throughout colon, no source found. Descending and sigmoid tics. Multiple small polyps of ascending to sigmoid, not removed.  02/28/18 Capsule endo. Fresh blood at ileum, starting at 6 hours 58 mins. Blood obscured identification of lesion. Blood throughout colon. Per Dr Marin Shutter "Ileum (where bleeding) not reachable locally, and likely not reachable by anterograde double balloon enteroscopy at [facility]".Transfused 5 U PRBCs during admission.  At discharge continued on monthly octreotide and started Doxycycline 100 bid to try to limit AVM bleeding (and for URI sx)with plannedtransition to danazol. Goal INR decreased to 1.8 - 2.3 03/09/18: Admitted with recurrent GI bleed. Coumadin was stopped. He received 3UPRBCs. Transferred to [facility] for deep entersospcopy on 03/10/2018. Hospital course at [facility] included double retro ballon scope and he was sent for  IR embolization x 3 but he had no evidence of bleeding. Received 12 UPRBCs. He remained off anticoagulants.    Seen by IR 5/20. CTA obtained, but no active bleeding found. Started on octreotide.  03/25/18: Repeat capsule endoscopy completed. Bleeding appears to come from the mid-ileum.  03/26/18: General surgery reconsulted. Recommended transfer back to [facility] for surgery with intraoperative endoscopy. [name] currently discussing options.  Ortho consulted 5/24 for left knee pain. S/p knee aspiration and steroid injection.   Remains off warfarin , INR 1.32  Dose of premarin given  5/24.   Yesterday developed ab bloating and distension with n/v. KUB suggestive of ileus versus early SBO. NGT placed for decompression but only 50cc out. Had very large BM with blood and stool last night and feels much better. Less distended. No further n/v. hgb down to 7.5 and now receiving 2 more units RBCs. Met with Palliative Care today.   RBCs 5/20 2U 5/21 1U  5/22 1U  5/23 1U 5/24 2U 5/26 2U   LVAD INTERROGATION:  HeartMate 2 LVAD:   Flow 5.9  liters/min, speed 9200, power 6, PI 3.7  occasional PI events    Objective:    Vital Signs:   Temp:  [97.8 F (36.6 C)-98.4 F (36.9 C)] 97.8 F (36.6 C) (05/26 1213) Pulse Rate:  [84-95] 85 (05/26 0745) Resp:  [16-22] 16 (05/26 0706) BP: (74-106)/(53-76) 82/68 (05/26 1213) SpO2:  [92 %-95 %] 95 % (05/26 1151) Weight:  [92.8 kg (204 lb 9.4 oz)] 92.8 kg (204 lb 9.4 oz) (05/26 0615) Last BM Date: 03/29/18 Mean arterial Pressure 70s  Intake/Output:   Intake/Output Summary (Last 24 hours) at 03/29/2018 1244 Last data filed at 03/29/2018 1200 Gross per 24 hour  Intake 1525 ml  Output 79 ml  Net 1446 ml     Physical Exam   General:  NAD. Sitting up in bed. Much more comfortable  HEENT: normal  Neck: supple. JVP 9-10  Carotids 2+ bilat; no bruits. No lymphadenopathy or thryomegaly appreciated. Cor: LVAD hum.  Lungs: Clear. Abdomen: obese soft, nontender, minimally distended. No hepatosplenomegaly. No bruits or masses. Good bowel sounds. Driveline site clean. Anchor in place.  Extremities: no cyanosis, clubbing, rash. Warm 1+ edema  AV fistula with thrill  Neuro: alert & oriented x 3. No focal deficits. Moves all 4 without problem    Telemetry   Afib 90-105 Personally reviewed.   Labs   Basic Metabolic Panel: Recent Labs  Lab 03/24/18 0228 03/25/18 0221 03/26/18 0258 03/27/18 0309 03/28/18 0513  NA 136 135 136 134* 139  K 3.6 3.7 4.0 4.5 3.6  CL 99* 99* 101 100* 102  CO2 30 29 29 27 29   GLUCOSE 145* 141* 222*  214* 124*  BUN 12 22* 14 22* 15  CREATININE 2.48* 3.51* 3.04* 4.15* 2.87*  CALCIUM 7.2* 7.1* 7.1* 7.1* 7.1*  MG 1.5* 1.8 1.9  --   --   PHOS  --   --  2.2* 3.2 2.6    Liver Function Tests: Recent Labs  Lab 03/26/18 0258 03/27/18 0309 03/28/18 0513  ALBUMIN 2.2* 2.1* 2.3*   No results for input(s): LIPASE, AMYLASE in the last 168 hours. No results for input(s): AMMONIA in the last 168 hours.  CBC: Recent Labs  Lab 03/25/18 0221  03/26/18 0258 03/26/18 1602 03/27/18 0309 03/27/18 2046 03/28/18 0513 03/29/18 0202  WBC 9.5  --  7.9  --  9.4  --  13.6* 9.7  HGB 8.1*   < > 7.9* 8.6* 7.5* 9.0* 8.5* 7.5*  HCT 24.8*   < > 24.1* 26.5* 23.1* 27.4* 25.6* 23.5*  MCV 90.2  --  88.9  --  90.2  --  90.8 92.9  PLT 149*  --  120*  --  137*  --  132* 124*   < > = values in this interval not displayed.    INR: Recent Labs  Lab 03/25/18 0221 03/26/18 0258 03/27/18 0309 03/28/18 0513 03/29/18 0202  INR 1.15 1.23 1.27 1.34 1.32    Other results:  EKG:    Imaging   Dg Abd Portable 1v  Result Date: 03/28/2018 CLINICAL DATA:  NG tube placement. EXAM: PORTABLE ABDOMEN - 1 VIEW COMPARISON:  03/28/2018 FINDINGS: Limited abdominal radiograph demonstrates enteric catheter in the expected location of gastric body, tip in the antral region. Nonobstructive bowel gas pattern. Left ventricular assist device, cardiac pacemaker, aortic valve prosthesis with stable appearance. IMPRESSION: Enteric catheter with tip overlying the expected location of the antral region of the stomach. Electronically Signed   By: Fidela Salisbury M.D.   On: 03/28/2018 21:21   Dg Abd Portable 1v  Result Date: 03/28/2018 CLINICAL DATA:  Encounter for ileus. EXAM: PORTABLE ABDOMEN - 1 VIEW COMPARISON:  None. FINDINGS: Left ventricular assist device is noted. Mildly dilated small bowel loops are noted concerning for ileus or distal small bowel obstruction. No abnormal calcifications are noted. IMPRESSION: Mildly  dilated small bowel loops are noted concerning for ileus or possibly distal small bowel obstruction. Continued radiographic follow-up is recommended. Electronically Signed   By: Marijo Conception, M.D.   On: 03/28/2018 12:47     Medications:     Scheduled Medications: . atorvastatin  40 mg Oral q1800  . busPIRone  5 mg Oral BID  . calcitRIOL  0.5 mcg Oral Q M,W,F-HD  . Chlorhexidine Gluconate Cloth  6 each Topical Q0600  . danazol  100 mg Oral BID  . darbepoetin (ARANESP) injection - DIALYSIS  100 mcg Intravenous Q Mon-HD  . feeding supplement  1 Container Oral TID BM  . gabapentin  300 mg Oral BID  . insulin aspart  0-9 Units Subcutaneous TID WC  . insulin glargine  8 Units Subcutaneous QHS  .  levothyroxine  25 mcg Oral QAC breakfast  . magnesium oxide  400 mg Oral Daily  . metoCLOPramide  5 mg Oral TID AC & HS  . mometasone-formoterol  2 puff Inhalation BID  . multivitamin  1 tablet Oral QHS  . pantoprazole  40 mg Oral Daily  . sildenafil  40 mg Oral TID    Infusions: . sodium chloride    . sodium chloride    . sodium chloride    . octreotide  (SANDOSTATIN)    IV infusion 50 mcg/hr (03/29/18 1200)    PRN Medications: sodium chloride, sodium chloride, acetaminophen, albuterol, alteplase, diphenhydrAMINE, heparin, lidocaine (PF), lidocaine-prilocaine, ondansetron (ZOFRAN) IV, oxyCODONE-acetaminophen, pentafluoroprop-tetrafluoroeth, traMADol, traZODone   Assessment/Plan:    1. GI Bleed/Symptomatic Anemia: GI AVMs. Double balloon retro endoscopy at [facility], unable to identify bleeding lesion.  He had failed attempted IR embolization x 3 while at [facility]. 12 units PRBCs at [facility]. He has had multiple units PRBCs here. Still with rectal bleeding, had more overnight.  - Received 2U PRBCs 5/19. 5.5>8.1 Given 1U PRBCs 5/21. 2U PRBCs 5/22. 1U PRBC 5/23. 2U 5/24 2U 5/26 - still bleeding. Had another large BM last night. Getting 2 more units RBCs now  - INR 1.32 - LDH 174>223>224>206>185>186>  192> 174Baseline LDH 200s.   - Continue octreotide drip and continue danazol 100 mg bid. Received premarin 5/24 will not repeat at this point over concerns for increased risk of pump thrombosis - IR consulted, but did not find any active bleeding on CTA 5/20 - GI consulted. Repeat capsule study showed active bleeding in the mid ileum.  - Surgery consulted and recommended transfer back to [facility] for intra-operative endoscopy and surgical resection once source is found. [name] has d/w [name] and they will plan to take him back tomorrow for repeat evaluation 2. Chronic Systolic Heart Failure/RV Failure: HMIII LVAD placed 2017.   - LDH stable at 174. VAD interrogated personally. Parameters stable. - Volume managed by HD. S/p HD 5/24. I spoke with [facility] today and let them know we are planning to transfer back to [name] in am. Will plan HD in early am.  - No bb with RV failure. Continue sildenafil 40 mg tid.  - Continue TED hose 3. ESRD- M/W/F/Sat. HD per [facility] . 4. Chronic A fib: Rate up today due to bleed. Off coumadin with GI bleed. No change 5. HTN: doppler MAPs 70s 6. Squamous Cell CA: s/p excision, margins at neck site not clear but has not wanted radiation. Left shin sore from excision. WOCN consulted for recommendations.  7. DMII - Continue SSI. No change 8. H/O CAD CABG. No s/s ischemia 9. H/O TAVR: Valve stable on last echo. No change.  10. NSVT - K 3.6 will supp  11. Left knee pain  - Uric acid 3.3 - Xray showed effusion. Ortho consulted and aspirated + steroid injection 5/24 12. Abdominal distension - Improved with large BM and Reglan yesterday.  - Will repeat KUB.  - Keep diet at clears today - Continue reglan for now  I reviewed the LVAD parameters from today, and compared the results to the patient's prior recorded data.  No programming changes were made.  The LVAD is functioning within specified parameters.  The patient performs LVAD self-test daily.  LVAD interrogation was  negative for any significant power changes, alarms or PI events/speed drops.  LVAD equipment check completed and is in good working order.  Back-up equipment present.   LVAD education done on emergency procedures  and precautions and reviewed exit site care.  Length of Stay: 9  Glori Bickers, MD 03/29/2018, 12:44 PM  VAD Team --- VAD ISSUES ONLY--- Pager [phone] (7am - 7am)  Advanced Heart Failure Team  Pager [phone] (M-F; 7a - 4p)  Please contact [facility] for night-coverage after hours (4p -7a ) and weekends on [email or web address]

## 2018-03-29 NOTE — Progress Notes (Addendum)
Pt had 2 large bloody stools. Total of 3 for my shift.  Md notified.  New orders for cbc obtained.  Labs to be  obtained early.  BP 88/74. If HGB less than 8 will call Md.  Will continue to monitor. Saunders Revel T

## 2018-03-29 NOTE — Progress Notes (Signed)
[facility] NEPHROLOGY PROGRESS NOTE  Assessment/ Plan: Pt is a 72 y.o. yo male  with ESRD on hemodialysis MWF at [facility]. He has complex past medical history of CAD, CABG, AS S/P TAVR, ischemic cardiomyopathy with ICD, S/P LVAD placement, here with recurrent GI bleed.Coumadin was stopped. He received blood transfusion.  Transferred to [facility] for deep entersospcopy on 03/10/2018. Hospital course at [facility] double retro ballon scope and he was sent for IR embolization x 3but he had no evidence of bleeding.  Assessment/Plan:  #Anemia/GI bleed: Status post multiple red blood cell transfusion.  Capsule endoscopy with possible mid ileum bleeding.  Evaluated by general surgery, GI.  Patient now has a small bowel obstruction, has NG tube.  Patient reported no improvement in GI bleed.  Hemoglobin 7.5.  On Aranesp 100 micrograms weekly.  # ESRD: Monday Wednesday Friday.   -Volume status looks acceptable, able to lie flat.  Plan for next dialysis tomorrow.   Patient has chronic hypotension.  Need to monitor blood pressure closely.  He is asymptomatic.   Left AV fistula for access  # Secondary hyperparathyroidism: Phosphorus level low therefore discontinued Renvela.  continue calcitriol.  Monitor labs.  #CAD, hx CABG, LVAD/ afib per cardiology  #Left knee pain: Evaluated by orthopedics, status post arthrocentesis.  Feeling better today.  Subjective: Seen and examined at bedside.  Has a small bowel obstruction and NG tube.  No improvement in GI bleed.  Denies chest pain or shortness of breath. Objective Vital signs in last 24 hours: Vitals:   03/29/18 0315 03/29/18 0615 03/29/18 0706 03/29/18 0745  BP: (!) 87/53  92/76 (!) 86/57  Pulse:   84 85  Resp: (!) 22  16   Temp: 98.1 F (36.7 C)  98.4 F (36.9 C)   TempSrc: Oral  Oral Oral  SpO2: 95%  94% 95%  Weight:  92.8 kg (204 lb 9.4 oz)    Height:       Weight change: 0 kg (0 lb)  Intake/Output Summary  (Last 24 hours) at 03/29/2018 0941 Last data filed at 03/29/2018 0730 Gross per 24 hour  Intake 1197.5 ml  Output 79 ml  Net 1118.5 ml       Labs: Basic Metabolic Panel: Recent Labs  Lab 03/26/18 0258 03/27/18 0309 03/28/18 0513  NA 136 134* 139  K 4.0 4.5 3.6  CL 101 100* 102  CO2 29 27 29   GLUCOSE 222* 214* 124*  BUN 14 22* 15  CREATININE 3.04* 4.15* 2.87*  CALCIUM 7.1* 7.1* 7.1*  PHOS 2.2* 3.2 2.6   Liver Function Tests: Recent Labs  Lab 03/26/18 0258 03/27/18 0309 03/28/18 0513  ALBUMIN 2.2* 2.1* 2.3*   No results for input(s): LIPASE, AMYLASE in the last 168 hours. No results for input(s): AMMONIA in the last 168 hours. CBC: Recent Labs  Lab 03/22/18 1206  03/25/18 0221  03/26/18 0258  03/27/18 0309 03/27/18 2046 03/28/18 0513 03/29/18 0202  WBC 9.6   < > 9.5  --  7.9  --  9.4  --  13.6* 9.7  NEUTROABS 7.7  --   --   --   --   --   --   --   --   --   HGB 8.0*   < > 8.1*   < > 7.9*   < > 7.5* 9.0* 8.5* 7.5*  HCT 24.6*   < > 24.8*   < > 24.1*   < > 23.1* 27.4* 25.6* 23.5*  MCV 86.9   < > 90.2  --  88.9  --  90.2  --  90.8 92.9  PLT 162   < > 149*  --  120*  --  137*  --  132* 124*   < > = values in this interval not displayed.   Cardiac Enzymes: No results for input(s): CKTOTAL, CKMB, CKMBINDEX, TROPONINI in the last 168 hours. CBG: Recent Labs  Lab 03/28/18 0845 03/28/18 1224 03/28/18 1724 03/28/18 2131 03/29/18 0847  GLUCAP 129* 143* 168* 169* 106*    Iron Studies: No results for input(s): IRON, TIBC, TRANSFERRIN, FERRITIN in the last 72 hours. Studies/Results: Dg Abd Portable 1v  Result Date: 03/28/2018 CLINICAL DATA:  NG tube placement. EXAM: PORTABLE ABDOMEN - 1 VIEW COMPARISON:  03/28/2018 FINDINGS: Limited abdominal radiograph demonstrates enteric catheter in the expected location of gastric body, tip in the antral region. Nonobstructive bowel gas pattern. Left ventricular assist device, cardiac pacemaker, aortic valve prosthesis with  stable appearance. IMPRESSION: Enteric catheter with tip overlying the expected location of the antral region of the stomach. Electronically Signed   By: Fidela Salisbury M.D.   On: 03/28/2018 21:21   Dg Abd Portable 1v  Result Date: 03/28/2018 CLINICAL DATA:  Encounter for ileus. EXAM: PORTABLE ABDOMEN - 1 VIEW COMPARISON:  None. FINDINGS: Left ventricular assist device is noted. Mildly dilated small bowel loops are noted concerning for ileus or distal small bowel obstruction. No abnormal calcifications are noted. IMPRESSION: Mildly dilated small bowel loops are noted concerning for ileus or possibly distal small bowel obstruction. Continued radiographic follow-up is recommended. Electronically Signed   By: Marijo Conception, M.D.   On: 03/28/2018 12:47    Medications: Infusions: . sodium chloride    . sodium chloride    . sodium chloride    . octreotide  (SANDOSTATIN)    IV infusion 50 mcg/hr (03/29/18 0730)    Scheduled Medications: . atorvastatin  40 mg Oral q1800  . busPIRone  5 mg Oral BID  . calcitRIOL  0.5 mcg Oral Q M,W,F-HD  . Chlorhexidine Gluconate Cloth  6 each Topical Q0600  . danazol  100 mg Oral BID  . darbepoetin (ARANESP) injection - DIALYSIS  100 mcg Intravenous Q Mon-HD  . gabapentin  300 mg Oral BID  . insulin aspart  0-9 Units Subcutaneous TID WC  . insulin glargine  8 Units Subcutaneous QHS  . levothyroxine  25 mcg Oral QAC breakfast  . magnesium oxide  400 mg Oral Daily  . metoCLOPramide  5 mg Oral TID AC & HS  . mometasone-formoterol  2 puff Inhalation BID  . multivitamin  1 tablet Oral QHS  . pantoprazole  40 mg Oral Daily  . sildenafil  40 mg Oral TID    have reviewed scheduled and prn medications.  Physical Exam: General: Able to lie flat, not in distress Heart: Regular rate rhythm S1-S2 normal Lungs: Clear bilateral, no wheezing or crackle Abdomen: Abdomen form, sluggish bowel sounds. Extremities: Lower extremity edema improving. Dialysis Access:  Left AV fistula has good thrill and bruit.  Kenyanna Grzesiak Prasad Jamarr Treinen 03/29/2018,9:41 AM  LOS: 9 days

## 2018-03-29 NOTE — Progress Notes (Signed)
Lab tech unable to obtain am lab draws earlier. Called to verify sending another Lab tech to draw CBC.  Will continue to monitor. Saunders Revel T

## 2018-03-29 NOTE — Progress Notes (Signed)
IMPRESSION and PLAN:   72 y.o.yo malewithESRDon HD (MWF),CAD, CABG, AS S/P TAVR,CHFwith ICD, S/P LVAD withobscure recurrentGI bleed. Coumadin was stopped.He had extensive GI work-up including attempted push enteroscopy and[facility] (unable to reach the bleeding site), s/pIR angio x 3 (including provocativemesenteric angiographywith TPA)but unable to find a bleeding lesion. Capsule endoscopy shows active bleeding in the mid ileum (capsule did not reach the cecum). Now with SBO requiring NG suction (doesnot seem to be due to capsule). Continued bleeding despite octreotide drip, danazol 100 mg twice daily, Premarin.  Plan: Replace NG tube, trend CBC, serial X-ray KUBs, await final decision from [facility]   HPI:    Chief Complaint:   Trevor [name] is a 72 y.o. male with multiple medical problems as above with continued rectal bleeding now with small bowel obstruction requiring NG tube.  GI work-up: 2018 colonoscopy. Blood throughout colon, but not in TI. Oozing tiny AVMS at hepatic flexure, treated with APC, endoclips. Three small small polyps not removed. Left colon tics.  02/27/18 EGD. Normal, no bleeding.  02/27/18 Colonoscopy. Blood in TI, sugg of SB source. Blood throughout colon, no source found. Descending and sigmoid tics. Multiple small polyps of ascending to sigmoid, not removed.  02/28/18 Capsule endo. Fresh blood at ileum, starting at 6 hours 58 mins. Blood obscured identification of lesion. Blood throughout colon. Per Dr Marin Shutter "Ileum (where bleeding) not reachable locally, and likely not reachable by anterograde double balloon enteroscopy at [facility]".Transfused 5 U PRBCs during admission.  At discharge continued on monthly octreotide and started Doxycycline 100 bid to try to limit AVM bleeding (and for URI sx)with plannedtransition to danazol. Goal INR decreased to 1.8 - 2.3 03/09/18: Admitted with recurrent GI bleed. Coumadin was stopped.  He received 3UPRBCs. Transferred to [facility] for deep entersospcopy on 03/10/2018. Hospital course at [facility] double retro ballon scope and he was sent for IR embolization x 3but he had no evidence of bleeding. Received12 UPRBCs. He remained off anticoagulants.    Seen by IR 5/20. CTA obtained, but no active bleeding found. Started on octreotide.  03/25/18: Repeat capsule endoscopy completed. Bleeding appears to come from the mid-ileum. Incomplete capsule study, never reached cecum.  03/26/18: General surgery reconsulted. Recommended transfer back to [facility] for surgery with intraoperative endoscopy.    Past Medical History:  Diagnosis Date  . AICD (automatic cardioverter/defibrillator) present   . Asthma   . AVM (arteriovenous malformation) of colon 07/07/2017  . CHF (congestive heart failure) ([place])   . Diabetes ([place])   . ESRF (end stage renal failure) ([place]) 07/07/2017   pt receiving hemodialysis Monday- Wednesday-friday, sometimes Saturday  . Kidney disease   . LVAD (left ventricular assist device) present ([place]) 12/2016  . Permanent atrial fibrillation ([place]) 07/07/2017  . Presence of permanent cardiac pacemaker    AICD  . Sleep apnea     Current Facility-Administered Medications  Medication Dose Route Frequency Provider Last Rate Last Dose  . 0.9 %  sodium chloride infusion  100 mL Intravenous PRN Rosita Fire, MD      . 0.9 %  sodium chloride infusion  100 mL Intravenous PRN Rosita Fire, MD      . 0.9 %  sodium chloride infusion   Intravenous Once Nila Nephew, MD      . acetaminophen (TYLENOL) tablet 650 mg  650 mg Oral Q4H PRN Bensimhon, Shaune Pascal, MD   650 mg at 03/28/18 1949  . albuterol (PROVENTIL) (2.5 MG/3ML) 0.083%  nebulizer solution 2.5 mg  2.5 mg Inhalation Q4H PRN Bensimhon, Shaune Pascal, MD   2.5 mg at 03/22/18 0631  . alteplase (CATHFLO ACTIVASE) injection 2 mg  2 mg Intracatheter Once PRN Rosita Fire, MD      . atorvastatin (LIPITOR) tablet  40 mg  40 mg Oral q1800 Bensimhon, Shaune Pascal, MD   40 mg at 03/28/18 1849  . busPIRone (BUSPAR) tablet 5 mg  5 mg Oral BID Bensimhon, Shaune Pascal, MD   5 mg at 03/29/18 1054  . calcitRIOL (ROCALTROL) capsule 0.5 mcg  0.5 mcg Oral Q M,W,F-HD Bensimhon, Shaune Pascal, MD   0.5 mcg at 03/27/18 1323  . Chlorhexidine Gluconate Cloth 2 % PADS 6 each  6 each Topical Q0600 Rosita Fire, MD   6 each at 03/29/18 0110  . danazol (DANOCRINE) capsule 100 mg  100 mg Oral BID Larey Dresser, MD   100 mg at 03/29/18 1054  . Darbepoetin Alfa (ARANESP) injection 100 mcg  100 mcg Intravenous Q Mon-HD Roney Jaffe, MD   100 mcg at 03/23/18 2259  . diphenhydrAMINE (BENADRYL) 12.5 MG/5ML elixir 25 mg  25 mg Oral PRN Bensimhon, Shaune Pascal, MD   25 mg at 03/29/18 0700  . gabapentin (NEURONTIN) capsule 300 mg  300 mg Oral BID Bensimhon, Shaune Pascal, MD   300 mg at 03/29/18 1054  . heparin injection 1,000 Units  1,000 Units Dialysis PRN Rosita Fire, MD      . insulin aspart (novoLOG) injection 0-9 Units  0-9 Units Subcutaneous TID WC Shirley Friar, PA-C   1 Units at 03/28/18 1700  . insulin glargine (LANTUS) injection 8 Units  8 Units Subcutaneous QHS Shirley Friar, PA-C   8 Units at 03/28/18 2201  . levothyroxine (SYNTHROID, LEVOTHROID) tablet 25 mcg  25 mcg Oral QAC breakfast Bensimhon, Shaune Pascal, MD   25 mcg at 03/29/18 1053  . lidocaine (PF) (XYLOCAINE) 1 % injection 5 mL  5 mL Intradermal PRN Rosita Fire, MD      . lidocaine-prilocaine (EMLA) cream 1 application  1 application Topical PRN Rosita Fire, MD      . magnesium oxide (MAG-OX) tablet 400 mg  400 mg Oral Daily Bensimhon, Shaune Pascal, MD   400 mg at 03/28/18 0945  . metoCLOPramide (REGLAN) 10 MG/10ML solution 5 mg  5 mg Oral TID AC & HS Bensimhon, Shaune Pascal, MD   5 mg at 03/29/18 0604  . mometasone-formoterol (DULERA) 200-5 MCG/ACT inhaler 2 puff  2 puff Inhalation BID Bensimhon, Shaune Pascal, MD   2 puff at 03/27/18 [phone]    . multivitamin (RENA-VIT) tablet 1 tablet  1 tablet Oral QHS Bensimhon, Shaune Pascal, MD   1 tablet at 03/28/18 2115  . octreotide (SANDOSTATIN) 500 mcg in sodium chloride 0.9 % 250 mL (2 mcg/mL) infusion  50 mcg/hr Intravenous Continuous Larey Dresser, MD 25 mL/hr at 03/29/18 0730 50 mcg/hr at 03/29/18 0730  . ondansetron (ZOFRAN) injection 4 mg  4 mg Intravenous Q6H PRN Bensimhon, Shaune Pascal, MD   4 mg at 03/28/18 1552  . oxyCODONE-acetaminophen (PERCOCET/ROXICET) 5-325 MG per tablet 1-2 tablet  1-2 tablet Oral Q6H PRN Shirley Friar, PA-C   2 tablet at 03/28/18 1552  . pantoprazole (PROTONIX) EC tablet 40 mg  40 mg Oral Daily Bensimhon, Shaune Pascal, MD   40 mg at 03/29/18 1054  . pentafluoroprop-tetrafluoroeth (GEBAUERS) aerosol 1 application  1 application Topical PRN Rosita Fire, MD      .  sildenafil (REVATIO) tablet 40 mg  40 mg Oral TID Larey Dresser, MD   40 mg at 03/28/18 2116  . traMADol (ULTRAM) tablet 50 mg  50 mg Oral Q6H PRN Bensimhon, Shaune Pascal, MD   50 mg at 03/27/18 2246  . traZODone (DESYREL) tablet 100 mg  100 mg Oral QHS PRN Bensimhon, Shaune Pascal, MD   100 mg at 03/27/18 2246    Past Surgical History:  Procedure Laterality Date  . AORTIC VALVE REPLACEMENT  2015   [place]  . APPLICATION OF A-CELL OF EXTREMITY Left 11/20/2017   Procedure: APPLICATION OF A-CELL;  Surgeon: Wallace Going, DO;  Location: [place];  Service: Plastics;  Laterality: Left;  . AV FISTULA PLACEMENT Left 06/30/2017   Procedure: CREATION of LEFT ARM Brachiocephalic Fistula;  Surgeon: Conrad Pritchett, MD;  Location: [place];  Service: Vascular;  Laterality: Left;  . CARDIAC CATHETERIZATION N/A 12/02/2016   Procedure: Right Heart Cath;  Surgeon: Larey Dresser, MD;  Location: [place] [facility];  Service: Cardiovascular;  Laterality: N/A;  . COLONOSCOPY N/A 02/14/2017   Procedure: COLONOSCOPY;  Surgeon: Milus Banister, MD;  Location: [place];  Service: Endoscopy;  Laterality: N/A;  .  COLONOSCOPY WITH PROPOFOL N/A 02/27/2018   Procedure: COLONOSCOPY WITH PROPOFOL;  Surgeon: Jerene Bears, MD;  Location: [place];  Service: Gastroenterology;  Laterality: N/A;  . CORONARY ANGIOPLASTY WITH STENT PLACEMENT  2013   in [place]  . CORONARY ARTERY BYPASS GRAFT  2010   in [place]  . DIALYSIS/PERMA CATHETER INSERTION  06/30/2017   Procedure: INSERTION Dialysis Catheter;  Surgeon: Conrad [place], MD;  Location: [facility];  Service: Vascular;;  . ESOPHAGOGASTRODUODENOSCOPY (EGD) WITH PROPOFOL N/A 02/27/2018   Procedure: ESOPHAGOGASTRODUODENOSCOPY (EGD) WITH PROPOFOL;  Surgeon: Jerene Bears, MD;  Location: [facility] ENDOSCOPY;  Service: Gastroenterology;  Laterality: N/A;  . GIVENS CAPSULE STUDY N/A 02/27/2018   Procedure: GIVENS CAPSULE STUDY;  Surgeon: Jerene Bears, MD;  Location: [place];  Service: Gastroenterology;  Laterality: N/A;  . GIVENS CAPSULE STUDY N/A 03/25/2018   Procedure: GIVENS CAPSULE STUDY;  Surgeon: Jackquline Denmark, MD;  Location: [facility] ENDOSCOPY;  Service: Endoscopy;  Laterality: N/A;  . IABP INSERTION N/A 12/11/2016   Procedure: IABP Insertion;  Surgeon: Larey Dresser, MD;  Location: [place] [facility];  Service: Cardiovascular;  Laterality: N/A;  . INSERTION OF IMPLANTABLE LEFT VENTRICULAR ASSIST DEVICE N/A 12/13/2016   Procedure: INSERTION OF IMPLANTABLE LEFT VENTRICULAR ASSIST DEVICE;  Surgeon: Ivin Poot, MD;  Location: [place];  Service: Open Heart Surgery;  Laterality: N/A;  HEARTMATE II  NITRIC OXIDE  . IR FLUORO GUIDE CV LINE LEFT  06/17/2017  . IR US GUIDE VASC ACCESS LEFT  06/17/2017  . MASS EXCISION Left 11/20/2017   Procedure: EXCISION OF LEFT NECK AND RIGHT LEG SKIN CANCER WITH A CELL PLACEMENT;  Surgeon: Wallace Going, DO;  Location: [place];  Service: Plastics;  Laterality: Left;  Marland Kitchen MASS EXCISION Left 11/26/2017   Procedure: EXPLORATION AND EXCISION OF LEFT NECK;  Surgeon: Wallace Going, DO;  Location: [place];  Service: Plastics;  Laterality: Left;    . RIGHT HEART CATH N/A 12/11/2016   Procedure: Right Heart Cath;  Surgeon: Larey Dresser, MD;  Location: [place] [facility];  Service: Cardiovascular;  Laterality: N/A;  . RIGHT HEART CATH N/A 03/27/2017   Procedure: Right Heart Cath;  Surgeon: Larey Dresser, MD;  Location: [place] [facility];  Service: Cardiovascular;  Laterality: N/A;  . RIGHT HEART CATH N/A 06/13/2017   Procedure: RIGHT HEART CATH;  Surgeon: Jolaine Artist, MD;  Location: [place] [facility];  Service: Cardiovascular;  Laterality: N/A;  . TEE WITHOUT CARDIOVERSION N/A 12/13/2016   Procedure: TRANSESOPHAGEAL ECHOCARDIOGRAM (TEE);  Surgeon: Ivin Poot, MD;  Location: [place];  Service: Open Heart Surgery;  Laterality: N/A;  . TRICUSPID VALVE REPLACEMENT N/A 12/13/2016   Procedure: TRICUSPID VALVE REPAIR WITH EDWARDS MC 3 TRICUSPID ANNULOPLASTY RING MODEL 4900 SIZE T 28;  Surgeon: Ivin Poot, MD;  Location: [place];  Service: Open Heart Surgery;  Laterality: N/A;    Family History  Problem Relation Age of Onset  . Heart failure Father   . Heart attack Father   . Healthy Mother   . Diabetes Paternal Grandfather     Social History   Tobacco Use  . Smoking status: Never Smoker  . Smokeless tobacco: Never Used  Substance Use Topics  . Alcohol use: No  . Drug use: No    No Known Allergies   Review of Systems: All systems reviewed and negative except where noted in HPI.    Physical Exam:     BP 91/62   Pulse 85   Temp 98.4 F (36.9 C) (Oral)   Resp 16   Ht 5\' 5"  (1.651 m)   Wt 204 lb 9.4 oz (92.8 kg)   SpO2 95%   BMI 34.05 kg/m  @WEIGHTLAST3 @ GENERAL:  Alert, oriented, cooperative, not in acute distress. PSYCH: :Pleasant, normal mood and affect. HEENT:  conjunctiva pink, mucous membranes moist, neck supple without masses. No jaundice. CARDIAC:  S1 S2 normal. No murmers. PULM: Normal respiratory effort, lungs CTA bilaterally, no wheezing. ABDOMEN: Inspection: No visible peristalsis, no  abnormal pulsations, skin normal.  Palpation/percussion: Soft, nontender, nondistended, no rigidity, no abnormal dullness to percussion, no hepatosplenomegaly and no palpable abdominal masses.  Auscultation: Normal bowel sounds, no abdominal bruits. Rectal exam: Deferred SKIN:  turgor, no lesions seen. Musculoskeletal:  Normal muscle tone, normal strength. NEURO: Alert and oriented x 3, no focal neurologic deficits.   Data Reviewed: I have personally reviewed following labs and imaging studies  CBC: Recent Labs  Lab 03/22/18 1206  03/27/18 0309 03/27/18 2046 03/28/18 0513 03/29/18 0202  WBC 9.6   < > 9.4  --  13.6* 9.7  NEUTROABS 7.7  --   --   --   --   --   HGB 8.0*   < > 7.5* 9.0* 8.5* 7.5*  HCT 24.6*   < > 23.1* 27.4* 25.6* 23.5*  MCV 86.9   < > 90.2  --  90.8 92.9  PLT 162   < > 137*  --  132* 124*   < > = values in this interval not displayed.   Basic Metabolic Panel: Recent Labs  Lab 03/24/18 0228 03/25/18 0221 03/26/18 0258 03/27/18 0309 03/28/18 0513  NA 136 135 136 134* 139  K 3.6 3.7 4.0 4.5 3.6  CL 99* 99* 101 100* 102  CO2 30 29 29 27 29   GLUCOSE 145* 141* 222* 214* 124*  BUN 12 22* 14 22* 15  CREATININE 2.48* 3.51* 3.04* 4.15* 2.87*  CALCIUM 7.2* 7.1* 7.1* 7.1* 7.1*  MG 1.5* 1.8 1.9  --   --   PHOS  --   --  2.2* 3.2 2.6   GFR: Estimated Creatinine Clearance: 24.4 mL/min (A) (by C-G formula based on SCr of 2.87 mg/dL (H)). Liver Function Tests: Recent Labs  Lab 03/26/18 0258 03/27/18 0309 03/28/18 0513  ALBUMIN 2.2* 2.1* 2.3*   No results for input(s): LIPASE, AMYLASE in the last 168 hours. No results for input(s): AMMONIA in the last 168 hours. Coagulation Profile: Recent Labs  Lab 03/28/18 0513 03/29/18 0202  INR 1.34 1.32   HbA1C: No results for input(s): HGBA1C in the last 72 hours. Lipid Profile: No results for input(s): CHOL, HDL, LDLCALC, TRIG, CHOLHDL, LDLDIRECT in the last 72 hours. Thyroid Function Tests: No results for  input(s): TSH, T4TOTAL, FREET4, T3FREE, THYROIDAB in the last 72 hours. Anemia Panel: No results for input(s): VITAMINB12, FOLATE, FERRITIN, TIBC, IRON, RETICCTPCT in the last 72 hours.  Recent Results (from the past 240 hour(s))  Body fluid culture     Status: None (Preliminary result)   Collection Time: 03/26/18  1:38 PM  Result Value Ref Range Status   Specimen Description SYNOVIAL LEFT KNEE  Final   Special Requests NONE  Final   Gram Stain   Final    MODERATE WBC PRESENT, PREDOMINANTLY PMN NO ORGANISMS SEEN    Culture   Final    NO GROWTH 3 DAYS Performed at [facility], [address], [place], [place] [address]    Report Status PENDING  Incomplete      Radiology Studies: Dg Chest Portable 1 View  Result Date: 03/09/2018 CLINICAL DATA:  LVAD EXAM: PORTABLE CHEST 1 VIEW COMPARISON:  02/28/2018 FINDINGS: Cardiopericardial enlargement is stable. There are changes of CABG, aortic valve replacement, and mitral valve repair. An LVAD and ICD are in place. There is no edema, consolidation, effusion, or pneumothorax. IMPRESSION: No evidence of active disease.  Stable from prior. Electronically Signed   By: Monte Fantasia M.D.   On: 03/09/2018 12:09   Dg Chest Port 1 View  Result Date: 02/28/2018 CLINICAL DATA:  Patient reports sudden onset of SOB this afternoon while lying in his bed (nurse later told me related to post dialysis treatment). Pain in center of chest especially when coughing. Hx of AICD present, asthma, CHF, diabetic, LVAD present, permanent atrial fibrillation, pacemaker present. 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 Non smoker. EXAM: PORTABLE CHEST 1 VIEW COMPARISON:  02/23/2018 FINDINGS: Changes from cardiac surgery with placement of a left ventricular assist device as well  as artificial valve placement, are stable. Cardiac silhouette is mildly enlarged. No mediastinal or hilar masses. No convincing adenopathy. Right anterior chest wall AICD is stable. Lungs are clear.  No convincing pleural effusion.  No pneumothorax. Skeletal structures are grossly intact. IMPRESSION: 1. No acute cardiopulmonary disease. Stable appearance from the prior study. Electronically Signed   By: Lajean Manes M.D.   On: 02/28/2018 19:57   Dg Knee Left Port  Result Date: 03/26/2018 CLINICAL DATA:  Left knee pain and swelling. EXAM: PORTABLE LEFT KNEE - 1-2 VIEW COMPARISON:  No recent prior. FINDINGS: Prominent knee joint effusion. Diffuse tricompartment degenerative change left knee. Loose bodies may be present. No acute bony or joint abnormality identified. No evidence of fracture dislocation. Peripheral vascular calcification noted. Surgical clip noted over the medial soft tissues. IMPRESSION: 1. Prominent knee joint effusion. Tricompartment degenerative change left knee. Loose bodies may be present. No acute bony abnormality identified. 2.  Peripheral vascular disease. Electronically Signed   By: Marcello Moores  Register   On: 03/26/2018 10:11   Dg Abd Portable 1v  Result Date:  03/28/2018 CLINICAL DATA:  NG tube placement. EXAM: PORTABLE ABDOMEN - 1 VIEW COMPARISON:  03/28/2018 FINDINGS: Limited abdominal radiograph demonstrates enteric catheter in the expected location of gastric body, tip in the antral region. Nonobstructive bowel gas pattern. Left ventricular assist device, cardiac pacemaker, aortic valve prosthesis with stable appearance. IMPRESSION: Enteric catheter with tip overlying the expected location of the antral region of the stomach. Electronically Signed   By: Fidela Salisbury M.D.   On: 03/28/2018 21:21   Dg Abd Portable 1v  Result Date: 03/28/2018 CLINICAL DATA:  Encounter for ileus. EXAM: PORTABLE ABDOMEN - 1 VIEW COMPARISON:  None. FINDINGS: Left ventricular assist device is  noted. Mildly dilated small bowel loops are noted concerning for ileus or distal small bowel obstruction. No abnormal calcifications are noted. IMPRESSION: Mildly dilated small bowel loops are noted concerning for ileus or possibly distal small bowel obstruction. Continued radiographic follow-up is recommended. Electronically Signed   By: Marijo Conception, M.D.   On: 03/28/2018 12:47   Ct Angio Abd/pel W/ And/or W/o  Result Date: 03/23/2018 CLINICAL DATA:  72 year old male with end-stage renal failure on hemodialysis and chronic CHF with a left ventricular assist device in place. 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 Evaluate for treatable gastric varices. EXAM: CT ANGIOGRAPHY ABDOMEN AND PELVIS WITH CONTRAST AND WITHOUT CONTRAST TECHNIQUE: Multidetector CT imaging of the abdomen and pelvis was performed using the standard protocol during bolus administration of intravenous contrast. Multiplanar reconstructed images and MIPs were obtained and reviewed to evaluate the vascular anatomy. CONTRAST:  136mL ISOVUE-370 IOPAMIDOL (ISOVUE-370) INJECTION 76% COMPARISON:  Prior CT scan of the abdomen and pelvis 12/03/2016 FINDINGS: VASCULAR Aorta: Tortuous abdominal aorta with atherosclerotic vascular calcifications. No evidence of aneurysm or dissection. Celiac: Atherosclerotic plaque at the origin results in perhaps mild stenosis. The splenic artery is heavily calcified. No evidence of aneurysm, or dissection. SMA: Heterogeneous atherosclerotic plaque at the origin results in at least mild stenosis. Replaced right hepatic artery. No aneurysm or dissection. Renals: Predominantly calcified atherosclerotic plaque results in moderate stenosis of the origins of the bilateral solitary renal arteries. IMA: Patent. Inflow: Scattered heterogeneous plaque without significant focal stenosis. Surgical clips overlie the left common femoral artery consistent with prior cutdown. Proximal Outflow: Minimal atherosclerotic plaque  without significant focal stenosis. Veins: Widely patent hepatic, portal, renal and visceral veins. No evidence of IVC or iliac venous thrombosis. No evidence of gastro renal shunt or significant gastric varices. Review of the MIP images confirms the above findings. NON-VASCULAR Lower chest: Incompletely imaged left ventricular assist device and cardiac rhythm maintenance device. Incompletely imaged aortic valve prosthesis and tricuspid valve annuloplasty. Normal distal thoracic esophagus. Clusters of small nodules in the posterior aspect of the right lower lobe remain unchanged dating back to January of 2018 and are likely benign. Hepatobiliary: Normal hepatic contour and morphology. No discrete hepatic lesions. Normal appearance of the gallbladder. No intra or extrahepatic biliary ductal dilatation. Pancreas: Fatty atrophy of the pancreatic head and uncinate process. No pancreatic mass or inflammatory changes. Spleen: Normal in size without focal abnormality. Adrenals/Urinary Tract: Normal adrenal glands. Small but symmetric kidneys bilaterally. No enhancing renal mass. Several too small to characterize low-attenuation lesions are present in the right kidney. Statistically, these are highly likely benign cysts. No hydronephrosis or nephrolithiasis. Unremarkable ureters and bladder. Stomach/Bowel: Colonic diverticular disease without CT evidence of active inflammation. No evidence of focal bowel wall thickening or obstruction. No evidence of acute intraluminal bleeding. No large gastric varices identified. Lymphatic: No  suspicious lymphadenopathy. Reproductive: Prostate is unremarkable. Other: No abdominal wall hernia or abnormality. No abdominopelvic ascites. Musculoskeletal: No acute fracture or aggressive appearing lytic or blastic osseous lesion. Scoliosis with multilevel degenerative disc disease and predominantly left-sided facet arthropathy most significant at L5-S1. IMPRESSION: VASCULAR 1. No evidence of  gastro renal shunt or significant gastric varices. 2. Extensive atherosclerotic vascular calcifications without evidence of aneurysm or dissection. Aortic Atherosclerosis (ICD10-170.0) 3. Incompletely imaged left ventricular assist device, valvular prostheses and cardiac rhythm maintenance device. 4. Probable mild to moderate bilateral renal artery stenoses. NON-VASCULAR 1. No evidence of acute intraluminal gastrointestinal bleeding. 2. Colonic diverticular disease without CT evidence of active inflammation. 3. Levoconvex scoliosis with multilevel advanced degenerative disc disease and facet arthropathy. Signed, Criselda Peaches, MD [facility] Electronically Signed   By: Jacqulynn Cadet M.D.   On: 03/23/2018 12:10      Radiah Lubinski,MD 03/29/2018, 11:09 AM   CC No ref. provider found

## 2018-03-29 NOTE — Progress Notes (Signed)
Notified Md about hgb 7.5.  Per md contact VAD Coordinator will do whatever needed.  Contacted Molly and she will send text to Dr Haroldine Laws.   bp 87/53 (64)  will continue to monitor Saunders Revel T

## 2018-03-29 NOTE — Consult Note (Signed)
Consultation Note Date: 03/29/2018   Patient Name: Trevor [name]  DOB: February 21, 1946  MRN: [id number]  Age / Sex: 72 y.o., male  PCP: Janith Lima, MD Referring Physician: Jolaine Artist, MD  Reason for Consultation: Establishing goals of care and Psychosocial/spiritual support  HPI/Patient Profile: 72 y.o. male  with past medical history of CAD s/p CABGx4, LVAD placement as destination therapy, ICD, ESRD on HD, and TAVR who was admitted on 03/20/2018 with recurrent GI bleeding. The patient has been battling an on-going GI bleed over the past month with therapy at both [place] and [facility].  Previous GI bleeding was remedied with APC of AVMs in his small bowel.  He has received greater than 20 units of PRBCs and is now being considered for transfer back to [facility] for simultaneous surgery/endoscopy to locate and remedy the bleeding.  Clinical Assessment and Goals of Care:  I have reviewed medical records including EPIC notes, labs and imaging, received report from the care team, assessed the patient "Trevor [name]", and then met at the bedside along with his two sisters, [name] and Altha Harm, to discuss diagnosis prognosis, GOC, EOL wishes, disposition and options.  I introduced Palliative Medicine as specialized medical care for people living with serious illness. It focuses on providing relief from the symptoms and stress of a serious illness. The goal is to improve quality of life for both the patient and the family.  We discussed a brief life review of the patient.  He was raised in [place] and graduated from [employer].  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  He lived there for approximately 33 years.  His wife developed cancer and he  developed heart problems.  Consequently they moved closer to better health care, but his heart remains in [place].  Trevor [name] has no children.  His wife has passed.  He now lives with his sister [name] locally.  [name] is his HCPOA.    As far as functional and nutritional status, Trevor [name] has noticed a decline since starting hemodialysis.  The decline in function naturally became much worse with bleeding.  He is incredibly fatigued after dialysis and even weak on the days between dialysis.     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  We discussed what is important to him and at what point he would want to be allowed to just rest.  Per Trevor [name] if he is unable to get out of bed +/or if he has to be permanently attached to machines, then he would rather not go thru it.  He would want to be allowed to rest.  Aside from short term intubation for surgery, Trevor [name] does not want to be intubated.  No Trach.  No feeding tube.  We talked about what Palliative Medicine does vs what comfort care and Hospice does.  I explained to Orwin  that he would unlikely ever go to [facility]).  He would most likely die in a Hospital due to the LVAD.  Trevor [name] was relieved and felt that was one less thing to worry about.  Questions and concerns were addressed.  Hard Choices booklet left for review. The family was encouraged to call with questions or concerns.    Primary Decision Maker:  PATIENT.   His HCPOA if needed is his sister [name].    SUMMARY OF RECOMMENDATIONS     Outside of surgery he does not want intubation.     No tracheostomy  No feeding tube.  If he is unable to be out and out of bed he would rather shift to comfort measures.  Recommend enhancing nutritional status as much as possible.  When off NPO status he would like chocolate Boost.  PMT will continue to follow in a supportive role while he is in house.  Additional Recommendations (Limitations, Scope, Preferences):  No Tracheostomy and no PEG  tube  Psycho-social/Spiritual:   Desire for further Chaplaincy support:  Yes, raised Catholic.  Prognosis:   Unable to determine, however given his on-going GIB he is at high risk of an adverse event that could end his life quickly.    Discharge Planning: To Be Determined  Likely transfer to [facility] for surgery.      Primary Diagnoses: Present on Admission: . Symptomatic anemia   I have reviewed the medical record, interviewed the patient and family, and examined the patient. The following aspects are pertinent.  Past Medical History:  Diagnosis Date  . AICD (automatic cardioverter/defibrillator) present   . Asthma   . AVM (arteriovenous malformation) of colon 07/07/2017  . CHF (congestive heart failure) ([place])   . Diabetes ([place])   . ESRF (end stage renal failure) ([place]) 07/07/2017   pt receiving hemodialysis Monday- Wednesday-friday, sometimes Saturday  . Kidney disease   . LVAD (left ventricular assist device) present ([place]) 12/2016  . Permanent atrial fibrillation ([place]) 07/07/2017  . Presence of permanent cardiac pacemaker    AICD  . Sleep apnea    Social History   Socioeconomic History  . Marital status: Widowed    Spouse name: Not on file  . Number of children: 0  . Years of education: 79  . Highest education level: Not on file  Occupational History  . Not on file  Social Needs  . Financial resource strain: Not on file  . Food insecurity:    Worry: Not on file    Inability: Not on file  . Transportation needs:    Medical: Not on file    Non-medical: Not on file  Tobacco Use  . Smoking status: Never Smoker  . Smokeless tobacco: Never Used  Substance and Sexual Activity  . Alcohol use: No  . Drug use: No  . Sexual activity: Not Currently  Lifestyle  . Physical activity:    Days per week: Not on file    Minutes per session: Not on file  . Stress: Not on file  Relationships  . Social connections:    Talks on phone: Not on file    Gets together: Not on  file    Attends religious service: Not on file    Active member of club or organization: Not on file    Attends meetings of clubs or organizations: Not on file    Relationship status: Not on file  Other Topics Concern  . Not on file  Social History Narrative  Patient is a widow. Moved to [place] from [place], [place]. Currently lives with his sister Cornell Bourbon.    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    Family History  Problem Relation Age of Onset  . Heart failure Father   . Heart attack Father   . Healthy Mother   . Diabetes Paternal Grandfather    Scheduled Meds: . atorvastatin  40 mg Oral q1800  . busPIRone  5 mg Oral BID  . calcitRIOL  0.5 mcg Oral Q M,W,F-HD  . Chlorhexidine Gluconate Cloth  6 each Topical Q0600  . danazol  100 mg Oral BID  . darbepoetin (ARANESP) injection - DIALYSIS  100 mcg Intravenous Q Mon-HD  . gabapentin  300 mg Oral BID  . insulin aspart  0-9 Units Subcutaneous TID WC  . insulin glargine  8 Units Subcutaneous QHS  . levothyroxine  25 mcg Oral QAC breakfast  . magnesium oxide  400 mg Oral Daily  . metoCLOPramide  5 mg Oral TID AC & HS  . mometasone-formoterol  2 puff Inhalation BID  . multivitamin  1 tablet Oral QHS  . pantoprazole  40 mg Oral Daily  . sildenafil  40 mg Oral TID   Continuous Infusions: . sodium chloride    . sodium chloride    . sodium chloride    . octreotide  (SANDOSTATIN)    IV infusion 50 mcg/hr (03/29/18 0730)   PRN Meds:.sodium chloride, sodium chloride, acetaminophen, albuterol, alteplase, diphenhydrAMINE, heparin, lidocaine (PF), lidocaine-prilocaine, ondansetron (ZOFRAN) IV, oxyCODONE-acetaminophen, pentafluoroprop-tetrafluoroeth, traMADol, traZODone No Known Allergies Review of Systems decreased abdominal pain,  + melana, +fatigue  Physical Exam  Well developed male, sitting up on the side of the bed.  Awake, alert, coherent, pleasant Resp decreased breath sounds Abdomen slight distention  non tender, +BS Ext ted hose in place.  Vital Signs: BP (!) 86/57   Pulse 85   Temp (P) 98.4 F (36.9 C) (Oral)   Resp 16   Ht 5' 5"  (1.651 m)   Wt 92.8 kg (204 lb 9.4 oz)   SpO2 95%   BMI 34.05 kg/m  Pain Scale: 0-10 POSS *See Group Information*: 1-Acceptable,Awake and alert Pain Score: 0-No pain   SpO2: SpO2: 95 % O2 Device:SpO2: 95 % O2 Flow Rate: .O2 Flow Rate (L/min): 2 L/min  IO: Intake/output summary:   Intake/Output Summary (Last 24 hours) at 03/29/2018 1107 Last data filed at 03/29/2018 0730 Gross per 24 hour  Intake 1197.5 ml  Output 79 ml  Net 1118.5 ml    LBM: Last BM Date: 03/29/18 Baseline Weight: Weight: 89.6 kg (197 lb 8.5 oz) Most recent weight: Weight: 92.8 kg (204 lb 9.4 oz)     Palliative Assessment/Data: 50%     Time In: 10:00 Time Out: 11:15 Time Total: 75 min. Greater than 50%  of this time was spent counseling and coordinating care related to the above assessment and plan.  Signed by: Florentina Jenny, PA-C Palliative Medicine Pager: [phone]  Please contact Palliative Medicine Team phone at [phone] for questions and concerns.  For individual provider: See Shea Evans

## 2018-03-30 LAB — RENAL FUNCTION PANEL
Albumin: 2.1 g/dL — ABNORMAL LOW (ref 3.5–5.0)
Anion gap: 7 (ref 5–15)
BUN: 32 mg/dL — AB (ref 6–20)
CHLORIDE: 104 mmol/L (ref 101–111)
CO2: 26 mmol/L (ref 22–32)
Calcium: 7.2 mg/dL — ABNORMAL LOW (ref 8.9–10.3)
Creatinine, Ser: 4.87 mg/dL — ABNORMAL HIGH (ref 0.61–1.24)
GFR calc Af Amer: 12 mL/min — ABNORMAL LOW (ref >60)
GFR calc non Af Amer: 11 mL/min — ABNORMAL LOW (ref >60)
GLUCOSE: 58 mg/dL — AB (ref 65–99)
POTASSIUM: 3.9 mmol/L (ref 3.5–5.1)
Phosphorus: 3.8 mg/dL (ref 2.5–4.6)
Sodium: 137 mmol/L (ref 135–145)

## 2018-03-30 LAB — GLUCOSE, CAPILLARY
Glucose-Capillary: 75 mg/dL (ref 65–99)
Glucose-Capillary: 127 mg/dL — ABNORMAL HIGH (ref 65–99)
Glucose-Capillary: 292 mg/dL — ABNORMAL HIGH (ref 65–99)
Glucose-Capillary: 145 mg/dL — ABNORMAL HIGH (ref 65–99)

## 2018-03-30 LAB — CBC
HEMATOCRIT: 23.1 % — AB (ref 39.0–52.0)
Hemoglobin: 7.5 g/dL — ABNORMAL LOW (ref 13.0–17.0)
MCH: 29 pg (ref 26.0–34.0)
MCHC: 32.5 g/dL (ref 30.0–36.0)
MCV: 89.2 fL (ref 78.0–100.0)
Platelets: 121 10*3/uL — ABNORMAL LOW (ref 150–400)
RBC: 2.59 MIL/uL — ABNORMAL LOW (ref 4.22–5.81)
RDW: 17.9 % — AB (ref 11.5–15.5)
WBC: 7.7 10*3/uL (ref 4.0–10.5)

## 2018-03-30 LAB — PROTIME-INR
INR: 1.37
Prothrombin Time: 16.8 seconds — ABNORMAL HIGH (ref 11.4–15.2)

## 2018-03-30 LAB — LACTATE DEHYDROGENASE: LDH: 185 U/L (ref 98–192)

## 2018-03-30 LAB — PREPARE RBC (CROSSMATCH)

## 2018-03-30 MED ORDER — DARBEPOETIN ALFA 200 MCG/0.4ML IJ SOSY: 200.0000 ug | PREFILLED_SYRINGE | INTRAMUSCULAR | Status: DC

## 2018-03-30 MED ORDER — SODIUM CHLORIDE 0.9 % IV SOLN: Freq: Once | INTRAVENOUS | Status: DC

## 2018-03-30 MED ORDER — GLUCERNA SHAKE PO LIQD
237.0000 mL | Freq: Three times a day (TID) | ORAL | Status: DC
  Administered 2018-03-30 – 2018-04-01 (×5): 237 mL via ORAL

## 2018-03-30 MED ORDER — DIPHENHYDRAMINE HCL 25 MG PO CAPS
25.0000 mg | ORAL_CAPSULE | ORAL | Status: DC | PRN
  Administered 2018-03-30 – 2018-04-01 (×4): 25 mg via ORAL
  Filled 2018-03-30 (×4): qty 1

## 2018-03-30 MED ORDER — CALCITRIOL 0.5 MCG PO CAPS
ORAL_CAPSULE | ORAL | Status: AC
  Filled 2018-03-30: qty 1

## 2018-03-30 MED ORDER — DARBEPOETIN ALFA 100 MCG/0.5ML IJ SOSY
PREFILLED_SYRINGE | INTRAMUSCULAR | Status: AC
  Filled 2018-03-30: qty 0.5

## 2018-03-30 NOTE — Progress Notes (Signed)
PT Cancellation Note  Patient Details Name: Trevor [name] MRN: [id number] DOB: [birth year]   Cancelled Treatment:    Reason Eval/Treat Not Completed: Patient at procedure or test/unavailable(initiated session but 3 min later HD arrived and session terminated. )   Whitman Meinhardt B Sumiya Mamaril 03/30/2018, 7:36 AM  [name], [place]

## 2018-03-30 NOTE — Progress Notes (Signed)
Mr. Trevor [name] is Trevor [name] but welcomed prayer with me.  He is on the catholic list and says Our Trevor [name] has been stopping by bringing communion which he appreciated.  He is receiving dialysis right now.  He was grateful for the prayer and says over time he expects his situation to get better.  Happy to meet and pray with him.    03/30/18 1131  Clinical Encounter Type  Visited With Patient;Health care provider  Visit Type Initial;Spiritual support  Spiritual Encounters  Spiritual Needs Prayer

## 2018-03-30 NOTE — Progress Notes (Signed)
Patient ID: Trevor [name], male   DOB: 12-13-1945, 72 y.o.   MRN: [id number]   Advanced Heart Failure VAD Team Note  PCP-Cardiologist: No primary care provider on file.   Subjective:    GI events  2018 colonoscopy. Blood throughout colon, but not in TI. Oozing tiny AVMS at hepatic flexure, treated with APC, endoclips. Three small small polyps not removed. Left colon tics.  02/27/18 EGD. Normal, no bleeding.  02/27/18 Colonoscopy. Blood in TI, sugg of SB source. Blood throughout colon, no source found. Descending and sigmoid tics. Multiple small polyps of ascending to sigmoid, not removed.  02/28/18 Capsule endo. Fresh blood at ileum, starting at 6 hours 58 mins. Blood obscured identification of lesion. Blood throughout colon. Per Dr Marin Shutter "Ileum (where bleeding) not reachable locally, and likely not reachable by anterograde double balloon enteroscopy at [facility]".Transfused 5 U PRBCs during admission.  At discharge continued on monthly octreotide and started Doxycycline 100 bid to try to limit AVM bleeding (and for URI sx)with plannedtransition to danazol. Goal INR decreased to 1.8 - 2.3 03/09/18: Admitted with recurrent GI bleed. Coumadin was stopped. He received 3UPRBCs. Transferred to [facility] for deep entersospcopy on 03/10/2018. Hospital course at [facility] included double retro ballon scope and he was sent for  IR embolization x 3 but he had no evidence of bleeding. Received 12 UPRBCs. He remained off anticoagulants.    Seen by IR 5/20. CTA obtained, but no active bleeding found. Started on octreotide.  03/25/18: Repeat capsule endoscopy completed. Bleeding appears to come from the mid-ileum.  03/26/18: General surgery reconsulted. Recommended transfer back to [facility] for surgery with intraoperative endoscopy. [name] currently discussing options.  Ortho consulted 5/24 for left knee pain. S/p knee aspiration and steroid injection.   Remains off warfarin , INR 1.32  Dose of premarin given  5/24.   On 5/25 developed ab bloating and distension with n/v. KUB suggestive of ileus versus early SBO. NGT placed for decompression but only 50cc out. Now   Had another bloody BM last night. Hgb 7.5. On HD this am. Feels better. No further ab distension. Tolerating clear liquid diet. No CP, SOB or orthopnea.    RBCs 5/20 2U 5/21 1U  5/22 1U  5/23 1U 5/24 2U 5/26 2U 5/27 1u  LVAD INTERROGATION:  HeartMate 2 LVAD:   Flow 6.0  liters/min, speed 9200, power 6, PI 4.2  occasional PI events  Personally reviewed   Objective:    Vital Signs:   Temp:  [98 F (36.7 C)-99 F (37.2 C)] 98.4 F (36.9 C) (05/27 1535) Pulse Rate:  [68-88] 88 (05/27 1535) Resp:  [14-20] 14 (05/27 1535) BP: (72-113)/(40-78) 76/53 (05/27 1535) SpO2:  [95 %-100 %] 97 % (05/27 1535) Weight:  [86.7 kg (191 lb 2.2 oz)-89.4 kg (197 lb 1.5 oz)] 86.7 kg (191 lb 2.2 oz) (05/27 1200) Last BM Date: 03/30/18 Mean arterial Pressure 70s  Intake/Output:   Intake/Output Summary (Last 24 hours) at 03/30/2018 1648 Last data filed at 03/30/2018 1600 Gross per 24 hour  Intake 2432 ml  Output 3002 ml  Net -570 ml     Physical Exam   General:  Sitting up in bed on HD. NAD.  HEENT: normal  Neck: supple. JVP 9 Carotids 2+ bilat; no bruits. No lymphadenopathy or thryomegaly appreciated. Cor: LVAD hum.  Lungs: Clear. Abdomen: obese soft, nontender, non-distended. No hepatosplenomegaly. No bruits or masses. Good bowel sounds. Driveline site clean. Anchor in place.  Extremities: no cyanosis, clubbing, rash. Warm no  1-2+ edema  LUE AVF Neuro: alert & oriented x 3. No focal deficits. Moves all 4 without problem    Telemetry   Afib 80-100 Personally reviewed.   Labs   Basic Metabolic Panel: Recent Labs  Lab 03/24/18 0228 03/25/18 0221 03/26/18 0258 03/27/18 0309 03/28/18 0513 03/30/18 0357  NA 136 135 136 134* 139 137  K 3.6 3.7 4.0 4.5 3.6 3.9  CL 99* 99* 101 100* 102 104  CO2 30 29 29 27 29 26   GLUCOSE  145* 141* 222* 214* 124* 58*  BUN 12 22* 14 22* 15 32*  CREATININE 2.48* 3.51* 3.04* 4.15* 2.87* 4.87*  CALCIUM 7.2* 7.1* 7.1* 7.1* 7.1* 7.2*  MG 1.5* 1.8 1.9  --   --   --   PHOS  --   --  2.2* 3.2 2.6 3.8    Liver Function Tests: Recent Labs  Lab 03/26/18 0258 03/27/18 0309 03/28/18 0513 03/30/18 0357  ALBUMIN 2.2* 2.1* 2.3* 2.1*   No results for input(s): LIPASE, AMYLASE in the last 168 hours. No results for input(s): AMMONIA in the last 168 hours.  CBC: Recent Labs  Lab 03/27/18 0309 03/27/18 2046 03/28/18 0513 03/29/18 0202 03/29/18 1524 03/30/18 0512  WBC 9.4  --  13.6* 9.7 9.1 7.7  HGB 7.5* 9.0* 8.5* 7.5* 8.6* 7.5*  HCT 23.1* 27.4* 25.6* 23.5* 26.8* 23.1*  MCV 90.2  --  90.8 92.9 90.2 89.2  PLT 137*  --  132* 124* 127* 121*    INR: Recent Labs  Lab 03/26/18 0258 03/27/18 0309 03/28/18 0513 03/29/18 0202 03/30/18 0357  INR 1.23 1.27 1.34 1.32 1.37    Other results:  EKG:    Imaging   Dg Abd Portable 1v  Result Date: 03/29/2018 CLINICAL DATA:  Follow-up ileus EXAM: PORTABLE ABDOMEN - 1 VIEW COMPARISON:  03/28/2018 FINDINGS: LVAD device is identified projecting over the left lower chest and left upper quadrant of the abdomen. ICD lead projects over the right ventricle. No significant change in gaseous distension of the small and large bowel loops. IMPRESSION: 1. No change in gaseous distension of the bowel loops. Electronically Signed   By: Kerby Moors M.D.   On: 03/29/2018 13:48   Dg Abd Portable 1v  Result Date: 03/28/2018 CLINICAL DATA:  NG tube placement. EXAM: PORTABLE ABDOMEN - 1 VIEW COMPARISON:  03/28/2018 FINDINGS: Limited abdominal radiograph demonstrates enteric catheter in the expected location of gastric body, tip in the antral region. Nonobstructive bowel gas pattern. Left ventricular assist device, cardiac pacemaker, aortic valve prosthesis with stable appearance. IMPRESSION: Enteric catheter with tip overlying the expected location of  the antral region of the stomach. Electronically Signed   By: Fidela Salisbury M.D.   On: 03/28/2018 21:21     Medications:     Scheduled Medications: . atorvastatin  40 mg Oral q1800  . busPIRone  5 mg Oral BID  . calcitRIOL  0.5 mcg Oral Q M,W,F-HD  . danazol  100 mg Oral BID  . [START ON 04/06/2018] darbepoetin (ARANESP) injection - DIALYSIS  200 mcg Intravenous Q Mon-HD  . feeding supplement  1 Container Oral TID BM  . feeding supplement (GLUCERNA SHAKE)  237 mL Oral TID BM  . gabapentin  300 mg Oral BID  . insulin aspart  0-9 Units Subcutaneous TID WC  . insulin glargine  8 Units Subcutaneous QHS  . levothyroxine  25 mcg Oral QAC breakfast  . magnesium oxide  400 mg Oral Daily  . metoCLOPramide  5  mg Oral TID AC & HS  . mometasone-formoterol  2 puff Inhalation BID  . multivitamin  1 tablet Oral QHS  . pantoprazole  40 mg Oral Daily  . sildenafil  40 mg Oral TID    Infusions: . sodium chloride    . sodium chloride    . octreotide  (SANDOSTATIN)    IV infusion 50 mcg/hr (03/30/18 0700)    PRN Medications: acetaminophen, albuterol, diphenhydrAMINE, ondansetron (ZOFRAN) IV, oxyCODONE-acetaminophen, traMADol, traZODone   Assessment/Plan:    1. GI Bleed/Symptomatic Anemia: GI AVMs. Double balloon retro endoscopy at [facility], unable to identify bleeding lesion.  He had failed attempted IR embolization x 3 while at [facility]. 12 units PRBCs at [facility]. He has had multiple units PRBCs here. Still with rectal bleeding, had more overnight.  - Received 2U PRBCs 5/19. 5.5>8.1 Given 1U PRBCs 5/21, 2U PRBCs 5/22, 1U PRBC 5/23, 2U 5/24, 2U 5/26, 1U 5/27 - still bleeding. Had another large BM last night. Getting 1 more unit RBCs now  - INR 1.37 - LDH 185 - Place SCDs - Continue octreotide drip and continue danazol 100 mg bid. Received premarin 5/24 will not repeat at this point over concerns for increased risk of pump thrombosis - IR consulted, but did not find any active bleeding on CTA  5/20 - GI consulted. Repeat capsule study showed active bleeding in the mid ileum.  - Surgery consulted and recommended transfer back to [facility] for intra-operative endoscopy and surgical resection once source is found. [name] has d/w [name] and they will plan to take him back for repeat evaluation - I spoke to Dr. Posey Pronto at [facility] and will plan to take him later in the week. 2. Chronic Systolic Heart Failure/RV Failure: HMIII LVAD placed 2017.   - LDH stable at 185.  - VAD interrogated personally. Parameters stable. - Volume managed by HD. Volume overloaded today  Getting HD today - No bb with RV failure. Continue sildenafil 40 mg tid.  - Continue TED hose 3. ESRD- M/W/F/Sat. HD per Renal today 4. Chronic A fib: Rate ok todayOff coumadin with GI bleed. No change 5. HTN: doppler MAPs 70s 6. Squamous Cell CA: s/p excision, margins at neck site not clear but has not wanted radiation. Left shin sore from excision. WOCN consulted for recommendations.  7. DMII - Continue SSI. No change 8. H/O CAD CABG. No s/s ischemia 9. H/O TAVR: Valve stable on last echo. No change.  10. NSVT - Quiescent. K 3.9 11. Left knee pain  - Uric acid 3.3 - Xray showed effusion. Ortho consulted and aspirated + steroid injection 5/24 12. Abdominal distension - Improved with large BM and Reglan yesterday.  - Tolerating clears. Advanced diet    I reviewed the LVAD parameters from today, and compared the results to the patient's prior recorded data.  No programming changes were made.  The LVAD is functioning within specified parameters.  The patient performs LVAD self-test daily.  LVAD interrogation was negative for any significant power changes, alarms or PI events/speed drops.  LVAD equipment check completed and is in good working order.  Back-up equipment present.   LVAD education done on emergency procedures and precautions and reviewed exit site care.  Length of Stay: [place], MD 03/30/2018, 4:48  PM  VAD Team --- VAD ISSUES ONLY--- Pager [phone] (7am - 7am)  Advanced Heart Failure Team  Pager [phone] (M-F; 7a - 4p)  Please contact [facility] for night-coverage after hours (4p -7a ) and  weekends on [email or web address]

## 2018-03-30 NOTE — Progress Notes (Signed)
Pt has LVAD dressing change due but refused. States he wants to wait until day shift 03/31/18. Current dressing status clean, dry, intact. Will continue to monitor.

## 2018-03-30 NOTE — Progress Notes (Signed)
Md made aware of HGB 7.5 down from 8.6 yesterday.  Pt had 3 Bm's during night  2 of those large bloody stools.

## 2018-03-30 NOTE — Progress Notes (Signed)
[facility] Progress Note  Subjective: no c/o had dialysis this am , tolerating 3 L UF goals for the most part , feels he has a little more edema than usual   Vitals:   03/30/18 1115 03/30/18 1130 03/30/18 1145 03/30/18 1200  BP: (!) 100/47 (!) 99/47 (!) 96/50 (!) 94/54  Pulse: 77 80 84 88  Resp:   18 18  Temp:   98.5 F (36.9 C) 98.5 F (36.9 C)  TempSrc:   Oral Oral  SpO2:   100% 100%  Weight:    86.7 kg (191 lb 2.2 oz)  Height:        Inpatient medications: . atorvastatin  40 mg Oral q1800  . busPIRone  5 mg Oral BID  . calcitRIOL  0.5 mcg Oral Q M,W,F-HD  . Chlorhexidine Gluconate Cloth  6 each Topical Q0600  . danazol  100 mg Oral BID  . darbepoetin (ARANESP) injection - DIALYSIS  100 mcg Intravenous Q Mon-HD  . feeding supplement  1 Container Oral TID BM  . gabapentin  300 mg Oral BID  . insulin aspart  0-9 Units Subcutaneous TID WC  . insulin glargine  8 Units Subcutaneous QHS  . levothyroxine  25 mcg Oral QAC breakfast  . magnesium oxide  400 mg Oral Daily  . metoCLOPramide  5 mg Oral TID AC & HS  . mometasone-formoterol  2 puff Inhalation BID  . multivitamin  1 tablet Oral QHS  . pantoprazole  40 mg Oral Daily  . sildenafil  40 mg Oral TID   . sodium chloride    . sodium chloride    . sodium chloride    . sodium chloride    . octreotide  (SANDOSTATIN)    IV infusion 50 mcg/hr (03/30/18 0700)   sodium chloride, sodium chloride, acetaminophen, albuterol, alteplase, diphenhydrAMINE, heparin, lidocaine (PF), lidocaine-prilocaine, ondansetron (ZOFRAN) IV, oxyCODONE-acetaminophen, pentafluoroprop-tetrafluoroeth, traMADol, traZODone  Exam:  alert no distress lying flat after hd completed  no jvd  chest cta bilat  cor reg +lvad hum   abd soft ntnd no ascites  ext 2+ bilat leg edema  lua avf +bruit  nonfocal ox 3   Dialysis: [facility]  4.5h  83kg 2/2 bath heparin none  lua avf  - mircera 100 every 2 wks last 4/17  - calc 0.5 ug tiw       Impression: 1 GI bleed - hx avm's, capsule endo showed active bleeding mid-ileum 2 CHF/ CM sp LVAD - f/b HF team 3 esrd on hd mwf + sat  4 volume is about 3kg up but has prob lost body wt as well 5 afib - per cards 6 hx TAVR 7 mbd ckd - cont vdra, phos 2-4 without binders 8 anemia of abl/ ckd - hb 7- 8.5 here sp multiple prbc's, cont esa will maximize darbe to 200 ug started next week  Plan - dialysis today, Tobie Lords MD [facility] pager [phone]   03/30/2018, 1:30 PM   Recent Labs  Lab 03/27/18 0309 03/28/18 0513 03/30/18 0357  NA 134* 139 137  K 4.5 3.6 3.9  CL 100* 102 104  CO2 27 29 26   GLUCOSE 214* 124* 58*  BUN 22* 15 32*  CREATININE 4.15* 2.87* 4.87*  CALCIUM 7.1* 7.1* 7.2*  PHOS 3.2 2.6 3.8   Recent Labs  Lab 03/27/18 0309 03/28/18 0513 03/30/18 0357  ALBUMIN 2.1* 2.3* 2.1*   Recent Labs  Lab 03/29/18 0202 03/29/18 1524 03/30/18  0512  WBC 9.7 9.1 7.7  HGB 7.5* 8.6* 7.5*  HCT 23.5* 26.8* 23.1*  MCV 92.9 90.2 89.2  PLT 124* 127* 121*   Iron/TIBC/Ferritin/ %Sat    Component Value Date/Time   IRON 125 06/14/2017 1605   TIBC 221 (L) 06/14/2017 1605   FERRITIN 622 (H) 06/14/2017 1605   IRONPCTSAT 57 (H) 06/14/2017 1605

## 2018-03-30 NOTE — Progress Notes (Addendum)
Tried to call HD multiple times throughout shift and  went to HD to find possible time pt will be going.  Nobody in HD at the time.  Pt had large bloody stool. Md made aware. CBC ordered.   Will continue to monitor. Saunders Revel T

## 2018-03-30 NOTE — Progress Notes (Signed)
HD called report given.  HD approx 0730.

## 2018-03-30 NOTE — Progress Notes (Signed)
Call HD for time pt going .  Per Caryl Pina they were just getting there and did not know at this time.  I let her know that Pt going to [name] today and Dr Haroldine Laws wanted pt to go early.  Reply was "we'll figure it out".  Still unsure a time.  Pt had several BM's throughout night.  HGB dropped this am.  Will make Md aware.

## 2018-03-30 NOTE — Progress Notes (Signed)
Initial Nutrition Assessment  DOCUMENTATION CODES:   Obesity unspecified  INTERVENTION:   -Glucerna Shake po TID, each supplement provides 220 kcal and 10 grams of protein  NUTRITION DIAGNOSIS:   Increased nutrient needs related to chronic illness as evidenced by estimated needs.  GOAL:   Patient will meet greater than or equal to 90% of their needs  MONITOR:   PO intake, Supplement acceptance, Labs, Weight trends, Skin, I & O's  REASON FOR ASSESSMENT:   Consult Assessment of nutrition requirement/status  ASSESSMENT:   Trevor [name] is a 72 y.o. male with a history of CAD s/p CABG x 4 2010, OSA, AS with TAVR 2015, , ESRD on HD, DM2, chronic atrial fibrillation, asthma, GI bleed, and ischemic cardiomyopathy with Medtronic ICD.  S/P HMII LVAD for Destination therapy on 12/14/2015.   5/6- admitted with recurrent GI bleed 5/7- transferred to [name] for deep enteroscopy; hospital course included double retro ballon scope and IR embolization x 3 with no evidence of bleeding 5/17- transferred back to [name] 5/23- per [name] notes, partial open partial thickness pre-tibial wound, s/p aspiration and injection by ortho 5/24- GI capsule study revealed active bleeding in small bowel; awaiting transfer back to [name] for endoscopy 5/25- NGT placed 5/27- NGT removed  Pt sleeping soundly at time of visit. No family at bedside to obtain further hx.   Reviewed wt hx; noted no wt loss, however, suspect edema may be masking true wt loss.   Per palliative care notes, requesting optimization of nutritional status. Pt was on clear liquid diet until this afternoon (advanced to Heart Healthy). Pt is requesting chocolate supplements. Will provide for now given that K and Phos are WDL (Glucerna shake has 250 mg K vs Ensure which contains 350 mg K). Also agree with Heart Healthy diet for increased oral intake.   Labs reviewed: CBGS: 75-127 (8 units insulin glargine q HS and 0-9 units insulin aspart TID  with meals).   Diet Order:   Diet Order           Diet Heart Room service appropriate? Yes; Fluid consistency: Thin  Diet effective now          EDUCATION NEEDS:   Not appropriate for education at this time  Skin:  Skin Assessment: Reviewed RN Assessment  Last BM:  03/30/18  Height:   Ht Readings from Last 1 Encounters:  03/20/18 5\' 5"  (1.651 m)    Weight:   Wt Readings from Last 1 Encounters:  03/30/18 191 lb 2.2 oz (86.7 kg)    Ideal Body Weight:  61.8 kg  BMI:  Body mass index is 31.81 kg/m.  Estimated Nutritional Needs:   Kcal:  1850-2050  Protein:  95-110 grams  Fluid:  per MD   Trevor [name] A. Trevor [name], RD, LDN, CDE Pager: [phone] After hours Pager: [phone]

## 2018-03-31 LAB — RENAL FUNCTION PANEL
Albumin: 2 g/dL — ABNORMAL LOW (ref 3.5–5.0)
Anion gap: 7 (ref 5–15)
BUN: 21 mg/dL — ABNORMAL HIGH (ref 6–20)
CALCIUM: 7.1 mg/dL — AB (ref 8.9–10.3)
CO2: 28 mmol/L (ref 22–32)
CREATININE: 3.86 mg/dL — AB (ref 0.61–1.24)
Chloride: 100 mmol/L — ABNORMAL LOW (ref 101–111)
GFR calc Af Amer: 17 mL/min — ABNORMAL LOW (ref >60)
GFR calc non Af Amer: 14 mL/min — ABNORMAL LOW (ref >60)
GLUCOSE: 163 mg/dL — AB (ref 65–99)
Phosphorus: 3.1 mg/dL (ref 2.5–4.6)
Potassium: 4 mmol/L (ref 3.5–5.1)
SODIUM: 135 mmol/L (ref 135–145)

## 2018-03-31 LAB — HEMOGLOBIN AND HEMATOCRIT, BLOOD
HCT: 25.1 % — ABNORMAL LOW (ref 39.0–52.0)
Hemoglobin: 8.2 g/dL — ABNORMAL LOW (ref 13.0–17.0)

## 2018-03-31 LAB — CBC
HCT: 22.6 % — ABNORMAL LOW (ref 39.0–52.0)
Hemoglobin: 7.2 g/dL — ABNORMAL LOW (ref 13.0–17.0)
MCH: 28.5 pg (ref 26.0–34.0)
MCHC: 31.9 g/dL (ref 30.0–36.0)
MCV: 89.3 fL (ref 78.0–100.0)
PLATELETS: 126 10*3/uL — AB (ref 150–400)
RBC: 2.53 MIL/uL — ABNORMAL LOW (ref 4.22–5.81)
RDW: 17.4 % — AB (ref 11.5–15.5)
WBC: 7.9 10*3/uL (ref 4.0–10.5)

## 2018-03-31 LAB — GLUCOSE, CAPILLARY
GLUCOSE-CAPILLARY: 154 mg/dL — AB (ref 65–99)
GLUCOSE-CAPILLARY: 165 mg/dL — AB (ref 65–99)
Glucose-Capillary: 219 mg/dL — ABNORMAL HIGH (ref 65–99)
Glucose-Capillary: 275 mg/dL — ABNORMAL HIGH (ref 65–99)

## 2018-03-31 LAB — LACTATE DEHYDROGENASE: LDH: 193 U/L — AB (ref 98–192)

## 2018-03-31 LAB — PROTIME-INR
INR: 1.35
PROTHROMBIN TIME: 16.6 s — AB (ref 11.4–15.2)

## 2018-03-31 LAB — PREPARE RBC (CROSSMATCH)

## 2018-03-31 MED ORDER — SODIUM CHLORIDE 0.9 % IV SOLN: Freq: Once | INTRAVENOUS | Status: AC

## 2018-03-31 MED ORDER — PREDNISONE 50 MG PO TABS
60.0000 mg | ORAL_TABLET | Freq: Every day | ORAL | Status: DC
  Administered 2018-03-31 – 2018-04-01 (×2): 60 mg via ORAL
  Filled 2018-03-31 (×2): qty 1

## 2018-03-31 MED ORDER — CHLORHEXIDINE GLUCONATE CLOTH 2 % EX PADS: 6.0000 | MEDICATED_PAD | Freq: Every day | CUTANEOUS | Status: DC

## 2018-03-31 NOTE — Progress Notes (Signed)
Physical Therapy Treatment Patient Details Name: Trevor [name] MRN: [id number] DOB: 14-Feb-1946 Today's Date: 03/31/2018    History of Present Illness Trevor [name] is a 72 y.o. male with a history of CAD s/p CABG x 4 2010, OSA, AS with TAVR 2015 , ESRD on HD, DM2, chronic Afib, asthma, GI bleed, and ICM with Medtronic ICD.  S/P HMII LVAD for Destination therapy on 12/13/2016. admitted for symptomatic anemia. Pt with con't GI bleed. Went to [facility] for a week and was unsuccessful with treatment to stop the bleed. Pt with left knee aspiration and steroid injection 5/24    PT Comments    Pt pleasant and willing to participate with therapy. Pt c/o fatigue after HD yesterday and L knee pain 8/10. Pt wearing Reed Breech knee brace that was too far distal on LLE and was readjusted during session. Pt able to ambulate 155ft min guard with standing rest required due to LLE pain. Pt reports performing LE exercises throughout day when not in therapy.  Follow Up Recommendations  Home health PT;Supervision - Intermittent     Equipment Recommendations  3in1 (PT)    Recommendations for Other Services       Precautions / Restrictions Precautions Precaution Comments: LVAD Required Braces or Orthoses: Other Brace/Splint Other Brace/Splint: left knee don joy Restrictions Weight Bearing Restrictions: No    Mobility  Bed Mobility               General bed mobility comments: pt up in chair upon PT arrival  Transfers Overall transfer level: Modified independent               General transfer comment: increased time with pt guarding in anticipation of pain.   Ambulation/Gait Ambulation/Gait assistance: Min guard Ambulation Distance (Feet): 100 Feet Assistive device: Rolling walker (2 wheeled) Gait Pattern/deviations: Step-through pattern;Decreased stride length;Decreased stance time - left Gait velocity: Decreased   General Gait Details: Pt ambulated 100 ft with RW with 4 standing rest  breaks secondary to L knee pain. Pt stable during gait but min guard for safety due to knee pain and fatigue. Pt demonstrates compensatory gait pattern of decreased knee flexion during LLE stance phase   Stairs             Wheelchair Mobility    Modified Rankin (Stroke Patients Only)       Balance Overall balance assessment: No apparent balance deficits (not formally assessed)                                          Cognition Arousal/Alertness: Awake/alert Behavior During Therapy: WFL for tasks assessed/performed Overall Cognitive Status: Within Functional Limits for tasks assessed                                        Exercises General Exercises - Lower Extremity Long Arc Quad: AROM;Left;10 reps(Pt educated regarding only moving through pain free ROM)    General Comments        Pertinent Vitals/Pain Pain Score: 8  Pain Location: left knee with flexion Pain Descriptors / Indicators: Constant;Throbbing Pain Intervention(s): Limited activity within patient's tolerance;Repositioned;Monitored during session    Home Living  Prior Function            PT Goals (current goals can now be found in the care plan section) Progress towards PT goals: Progressing toward goals    Frequency           PT Plan Current plan remains appropriate    Co-evaluation              AM-PAC PT "6 Clicks" Daily Activity  Outcome Measure  Difficulty turning over in bed (including adjusting bedclothes, sheets and blankets)?: None Difficulty moving from lying on back to sitting on the side of the bed? : None Difficulty sitting down on and standing up from a chair with arms (e.g., wheelchair, bedside commode, etc,.)?: A Little Help needed moving to and from a bed to chair (including a wheelchair)?: A Little Help needed walking in hospital room?: A Little Help needed climbing 3-5 steps with a railing? : A  Little 6 Click Score: 20    End of Session   Activity Tolerance: Patient tolerated treatment well Patient left: in chair;with call bell/phone within reach Nurse Communication: Mobility status PT Visit Diagnosis: Muscle weakness (generalized) (M62.81);Other abnormalities of gait and mobility (R26.89)     Time: 6546-5035 PT Time Calculation (min) (ACUTE ONLY): 25 min  Charges:  $Gait Training: 8-22 mins $Therapeutic Activity: 8-22 mins                    G Codes:      [place] 03/31/2018, 9:10 AM

## 2018-03-31 NOTE — Progress Notes (Signed)
[facility] Progress Note  Subjective: no c/o today  Vitals:   03/31/18 0341 03/31/18 0759 03/31/18 0853 03/31/18 1109  BP: (!) 81/56 90/69  (!) 77/60  Pulse: 76 (!) 51 94 96  Resp:    16  Temp: 98.2 F (36.8 C) 98.4 F (36.9 C)  98.6 F (37 C)  TempSrc: Oral Oral  Oral  SpO2: 94% 98%  94%  Weight: 87.9 kg (193 lb 12.6 oz)     Height:        Inpatient medications: . atorvastatin  40 mg Oral q1800  . busPIRone  5 mg Oral BID  . calcitRIOL  0.5 mcg Oral Q M,W,F-HD  . danazol  100 mg Oral BID  . [START ON 04/06/2018] darbepoetin (ARANESP) injection - DIALYSIS  200 mcg Intravenous Q Mon-HD  . feeding supplement  1 Container Oral TID BM  . feeding supplement (GLUCERNA SHAKE)  237 mL Oral TID BM  . gabapentin  300 mg Oral BID  . insulin aspart  0-9 Units Subcutaneous TID WC  . insulin glargine  8 Units Subcutaneous QHS  . levothyroxine  25 mcg Oral QAC breakfast  . magnesium oxide  400 mg Oral Daily  . metoCLOPramide  5 mg Oral TID AC & HS  . mometasone-formoterol  2 puff Inhalation BID  . multivitamin  1 tablet Oral QHS  . pantoprazole  40 mg Oral Daily  . predniSONE  60 mg Oral Q breakfast  . sildenafil  40 mg Oral TID   . sodium chloride    . sodium chloride    . sodium chloride    . octreotide  (SANDOSTATIN)    IV infusion 50 mcg/hr (03/31/18 0800)   acetaminophen, albuterol, diphenhydrAMINE, ondansetron (ZOFRAN) IV, oxyCODONE-acetaminophen, traMADol, traZODone  Exam:  alert no distress lying flat after hd completed  no jvd  chest cta bilat  cor reg +lvad hum   abd soft ntnd no ascites  ext 2+ bilat leg edema  lua avf +bruit  nonfocal ox 3   Dialysis: [facility]  4.5h  83kg 2/2 bath heparin none  lua avf  - mircera 100 every 2 wks last 4/17  - calc 0.5 ug tiw      Impression: 1 GI bleed - hx avm's, capsule endo showed active bleeding mid-ileum, poss tx to [name] soon 2 CHF/ CM sp LVAD - f/b HF team 3 esrd on hd mwf + sat  4 volume - +leg edema  5kg up 5 afib - per cards 6 hx TAVR 7 mbd ckd - cont vdra, phos 2-4 without binders 8 anemia of abl/ ckd - hb 7- 8.5 here sp multiple prbc's, cont darbe weekly will ^ 200/ wk  Plan - dialysis wednesday   Rob Brayen Bunn MD [facility] pager [phone]   03/31/2018, 11:23 AM   Recent Labs  Lab 03/28/18 0513 03/30/18 0357 03/31/18 0748  NA 139 137 135  K 3.6 3.9 4.0  CL 102 104 100*  CO2 29 26 28   GLUCOSE 124* 58* 163*  BUN 15 32* 21*  CREATININE 2.87* 4.87* 3.86*  CALCIUM 7.1* 7.2* 7.1*  PHOS 2.6 3.8 3.1   Recent Labs  Lab 03/28/18 0513 03/30/18 0357 03/31/18 0748  ALBUMIN 2.3* 2.1* 2.0*   Recent Labs  Lab 03/29/18 1524 03/30/18 0512 03/31/18 0740  WBC 9.1 7.7 7.9  HGB 8.6* 7.5* 7.2*  HCT 26.8* 23.1* 22.6*  MCV 90.2 89.2 89.3  PLT 127* 121* 126*   Iron/TIBC/Ferritin/ %Sat  Component Value Date/Time   IRON 125 06/14/2017 1605   TIBC 221 (L) 06/14/2017 1605   FERRITIN 622 (H) 06/14/2017 1605   IRONPCTSAT 57 (H) 06/14/2017 1605

## 2018-03-31 NOTE — Progress Notes (Addendum)
Reviewed chart.  Visited briefly with patient.  He is in good spirits - just finished working with PT.  States he is still having rectal bleeding.  Hopeful for transfer to [facility] in the near future to remedy the bleeding.    VSS, no distress.  Positive attitude/ outlook.  PMT will continue to monitor peripherally.  Florentina Jenny, PA-C Palliative Medicine Pager: [phone]  No charge note.

## 2018-03-31 NOTE — Progress Notes (Addendum)
CARDIAC REHAB PHASE I   PRE:  Rate/Rhythm: 85 Afib  BP:  Sitting: 90 doppler MAP      SaO2:   MODE:  Ambulation: 340 ft   POST:  Rate/Rhythm: 125 Afib with PVCs  BP:  Sitting: 86 doppler MAP    SaO2:   Pt helped to bathroom. Pt walked 340 ft; 2 standing rests. Pt c/o knee pain with flexion, and some SOB. Pt returned to recliner, switched to power cord. Sister at bedside, call bell within reach.  [phone]  Rufina Falco, RN BSN 03/31/2018 2:53 PM

## 2018-03-31 NOTE — Progress Notes (Signed)
Patient ID: Trevor [name], male   DOB: 1946/02/07, 72 y.o.   MRN: [id number]   Advanced Heart Failure VAD Team Note  PCP-Cardiologist: No primary care provider on file.   Subjective:    GI events  2018 colonoscopy. Blood throughout colon, but not in TI. Oozing tiny AVMS at hepatic flexure, treated with APC, endoclips. Three small small polyps not removed. Left colon tics.  02/27/18 EGD. Normal, no bleeding.  02/27/18 Colonoscopy. Blood in TI, sugg of SB source. Blood throughout colon, no source found. Descending and sigmoid tics. Multiple small polyps of ascending to sigmoid, not removed.  02/28/18 Capsule endo. Fresh blood at ileum, starting at 6 hours 58 mins. Blood obscured identification of lesion. Blood throughout colon. Per Dr Marin Shutter "Ileum (where bleeding) not reachable locally, and likely not reachable by anterograde double balloon enteroscopy at [facility]".Transfused 5 U PRBCs during admission.  At discharge continued on monthly octreotide and started Doxycycline 100 bid to try to limit AVM bleeding (and for URI sx)with plannedtransition to danazol. Goal INR decreased to 1.8 - 2.3 03/09/18: Admitted with recurrent GI bleed. Coumadin was stopped. He received 3UPRBCs. Transferred to [facility] for deep entersospcopy on 03/10/2018. Hospital course at [facility] included double retro ballon scope and he was sent for  IR embolization x 3 but he had no evidence of bleeding. Received 12 UPRBCs. He remained off anticoagulants.    Seen by IR 5/20. CTA obtained, but no active bleeding found. Started on octreotide.  03/25/18: Repeat capsule endoscopy completed. Bleeding appears to come from the mid-ileum.  03/26/18: General surgery reconsulted. Recommended transfer back to [facility] for surgery with intraoperative endoscopy. [name] currently discussing options.  Ortho consulted 5/24 for left knee pain. S/p knee aspiration and steroid injection.   Remains off warfarin , INR 1.32  Dose of premarin given  5/24.   On 5/25 developed abdominal bloating and distension with n/v. KUB suggestive of ileus versus early SBO. NGT placed for decompression.  Had large bloody BM after this and felt much better, diet now advanced to regular.    Had bloody BM last night. No CP, SOB or orthopnea. Left knee stiffening again, difficult to walk. CBC not back this morning yet. He had HD yesterday.   RBCs 5/20 2U 5/21 1U  5/22 1U  5/23 1U 5/24 2U 5/26 2U 5/27 1u  LVAD INTERROGATION:  HeartMate 2 LVAD:   Flow 5.6  liters/min, speed 9200, power 5.8, PI 4.3.  Multiple PI events, look to be around the time of HD.    Objective:    Vital Signs:   Temp:  [97.8 F (36.6 C)-98.7 F (37.1 C)] 98.2 F (36.8 C) (05/28 0341) Pulse Rate:  [76-101] 76 (05/28 0341) Resp:  [14-20] 16 (05/27 1938) BP: (71-113)/(36-71) 81/56 (05/28 0341) SpO2:  [92 %-100 %] 94 % (05/28 0341) Weight:  [191 lb 2.2 oz (86.7 kg)-197 lb 1.5 oz (89.4 kg)] 193 lb 12.6 oz (87.9 kg) (05/28 0341) Last BM Date: 03/30/18 Mean arterial Pressure 70s  Intake/Output:   Intake/Output Summary (Last 24 hours) at 03/31/2018 0732 Last data filed at 03/31/2018 0300 Gross per 24 hour  Intake 2335 ml  Output 3002 ml  Net -667 ml     Physical Exam   GENERAL: Well appearing this am. NAD.  HEENT: Normal. NECK: Supple, JVP 8-9 cm. Carotids OK.  CARDIAC:  Mechanical heart sounds with LVAD hum present.  LUNGS:  CTAB, normal effort.  ABDOMEN:  NT, ND, no HSM. No bruits or masses. +BS  LVAD exit site: Well-healed and incorporated. Dressing dry and intact. No erythema or drainage. Stabilization device present and accurately applied. Driveline dressing changed daily per sterile technique. EXTREMITIES:  Warm and dry. No cyanosis, clubbing, rash. 1+ ankle edema.   NEUROLOGIC:  Alert & oriented x 3. Cranial nerves grossly intact. Moves all 4 extremities w/o difficulty. Affect pleasant     Telemetry   Afib 80s Personally reviewed.   Labs   Basic  Metabolic Panel: Recent Labs  Lab 03/25/18 0221 03/26/18 0258 03/27/18 0309 03/28/18 0513 03/30/18 0357  NA 135 136 134* 139 137  K 3.7 4.0 4.5 3.6 3.9  CL 99* 101 100* 102 104  CO2 29 29 27 29 26   GLUCOSE 141* 222* 214* 124* 58*  BUN 22* 14 22* 15 32*  CREATININE 3.51* 3.04* 4.15* 2.87* 4.87*  CALCIUM 7.1* 7.1* 7.1* 7.1* 7.2*  MG 1.8 1.9  --   --   --   PHOS  --  2.2* 3.2 2.6 3.8    Liver Function Tests: Recent Labs  Lab 03/26/18 0258 03/27/18 0309 03/28/18 0513 03/30/18 0357  ALBUMIN 2.2* 2.1* 2.3* 2.1*   No results for input(s): LIPASE, AMYLASE in the last 168 hours. No results for input(s): AMMONIA in the last 168 hours.  CBC: Recent Labs  Lab 03/27/18 0309 03/27/18 2046 03/28/18 0513 03/29/18 0202 03/29/18 1524 03/30/18 0512  WBC 9.4  --  13.6* 9.7 9.1 7.7  HGB 7.5* 9.0* 8.5* 7.5* 8.6* 7.5*  HCT 23.1* 27.4* 25.6* 23.5* 26.8* 23.1*  MCV 90.2  --  90.8 92.9 90.2 89.2  PLT 137*  --  132* 124* 127* 121*    INR: Recent Labs  Lab 03/27/18 0309 03/28/18 0513 03/29/18 0202 03/30/18 0357 03/31/18 0347  INR 1.27 1.34 1.32 1.37 1.35    Other results:  EKG:    Imaging   Dg Abd Portable 1v  Result Date: 03/29/2018 CLINICAL DATA:  Follow-up ileus EXAM: PORTABLE ABDOMEN - 1 VIEW COMPARISON:  03/28/2018 FINDINGS: LVAD device is identified projecting over the left lower chest and left upper quadrant of the abdomen. ICD lead projects over the right ventricle. No significant change in gaseous distension of the small and large bowel loops. IMPRESSION: 1. No change in gaseous distension of the bowel loops. Electronically Signed   By: Kerby Moors M.D.   On: 03/29/2018 13:48     Medications:     Scheduled Medications: . atorvastatin  40 mg Oral q1800  . busPIRone  5 mg Oral BID  . calcitRIOL  0.5 mcg Oral Q M,W,F-HD  . danazol  100 mg Oral BID  . [START ON 04/06/2018] darbepoetin (ARANESP) injection - DIALYSIS  200 mcg Intravenous Q Mon-HD  . feeding  supplement  1 Container Oral TID BM  . feeding supplement (GLUCERNA SHAKE)  237 mL Oral TID BM  . gabapentin  300 mg Oral BID  . insulin aspart  0-9 Units Subcutaneous TID WC  . insulin glargine  8 Units Subcutaneous QHS  . levothyroxine  25 mcg Oral QAC breakfast  . magnesium oxide  400 mg Oral Daily  . metoCLOPramide  5 mg Oral TID AC & HS  . mometasone-formoterol  2 puff Inhalation BID  . multivitamin  1 tablet Oral QHS  . pantoprazole  40 mg Oral Daily  . sildenafil  40 mg Oral TID    Infusions: . sodium chloride    . sodium chloride    . octreotide  (SANDOSTATIN)    IV  infusion 50 mcg/hr (03/31/18 0300)    PRN Medications: acetaminophen, albuterol, diphenhydrAMINE, ondansetron (ZOFRAN) IV, oxyCODONE-acetaminophen, traMADol, traZODone   Assessment/Plan:    1. GI Bleed/Symptomatic Anemia: GI AVMs. Double balloon retro endoscopy at [facility], unable to identify bleeding lesion.  He had failed attempted IR embolization x 3 while at [facility]. 12 units PRBCs at [facility]. He has had multiple units PRBCs here. Received 2U PRBCs 5/19. 5.5>8.1 Given 1U PRBCs 5/21, 2U PRBCs 5/22, 1U PRBC 5/23, 2U 5/24, 2U 5/26, 1U 5/27.  IR consulted, but did not find any active bleeding on CTA 5/20. GI consulted. Repeat capsule study showed active bleeding in the mid ileum. Still with rectal bleeding, had more overnight. Pending CBC this morning.  - Use SCDs - Transfuse hgb < 8.  - Continue octreotide drip and continue danazol 100 mg bid. Received premarin 5/24 will not repeat at this point over concerns for increased risk of pump thrombosis. - Surgery consulted and recommended transfer back to [facility] for intra-operative endoscopy and surgical resection once source is found. I discussed with [name] and they will plan to take him back for surgical evaluation later this week.  2. Chronic Systolic Heart Failure/RV Failure: HMIII LVAD placed 2017.   - LDH stable at 193.  - VAD interrogated personally. Parameters stable. -  Volume managed by HD. Next tomorrow.  - No bb with RV failure. Continue sildenafil 40 mg tid.  - Continue TED hose 3. ESRD- M/W/F/Sat. HD per Ren[facility].  4. Chronic A fib: Rate ok todayOff coumadin with GI bleed. No change 5. HTN: MAP 70s.  6. Squamous Cell CA: s/p excision, margins at neck site not clear but has not wanted radiation. Left shin sore from excision. WOCN consulted for recommendations.  7. DMII - Continue SSI. No change 8. H/O CAD CABG. No s/s ischemia 9. H/O TAVR: Valve stable on last echo. No change.  10. NSVT - Quiescent. 11. Left knee pain: Uric acid 3.3.  Xray showed effusion. Ortho consulted and aspirated + steroid injection 5/24. Knee remains painful and difficult to walk on, patient requests ortho reassess.  12. Abdominal distension: Suspect ileus, resolved.   I reviewed the LVAD parameters from today, and compared the results to the patient's prior recorded data.  No programming changes were made.  The LVAD is functioning within specified parameters.  The patient performs LVAD self-test daily.  LVAD interrogation was negative for any significant power changes, alarms or PI events/speed drops.  LVAD equipment check completed and is in good working order.  Back-up equipment present.   LVAD education done on emergency procedures and precautions and reviewed exit site care.  Length of Stay: 67  Loralie Champagne, MD 03/31/2018, 7:32 AM  VAD Team --- VAD ISSUES ONLY--- Pager [phone] (7am - 7am)  Advanced Heart Failure Team  Pager [phone] (M-F; 7a - 4p)  Please contact [facility] for night-coverage after hours (4p -7a ) and weekends on [email or web address]

## 2018-03-31 NOTE — Plan of Care (Signed)
  Problem: Clinical Measurements: Goal: Ability to maintain clinical measurements within normal limits will improve Outcome: Progressing Goal: Will remain free from infection Outcome: Progressing Goal: Diagnostic test results will improve Outcome: Progressing Goal: Cardiovascular complication will be avoided Outcome: Progressing   Problem: Coping: Goal: Level of anxiety will decrease Outcome: Progressing   Problem: Pain Managment: Goal: General experience of comfort will improve Outcome: Progressing   Problem: Skin Integrity: Goal: Risk for impaired skin integrity will decrease Outcome: Progressing   Problem: Clinical Measurements: Goal: Complications related to the disease process, condition or treatment will be avoided or minimized Outcome: Progressing   Problem: Bowel/Gastric: Goal: Will show no signs and symptoms of gastrointestina Problem: Cardiac: Goal: LVAD will function as expected and patient will experience no clinical alarms Outcome: Adequate for Discharge  l bleeding Outcome: Not Progressing   Problem: Fluid Volume: Goal: Will show no signs and symptoms of excessive bleeding Outcome: Not Progressing

## 2018-03-31 NOTE — Progress Notes (Signed)
Patient ID: Trevor [name], male   DOB: 08/18/1946, 72 y.o.   MRN: [id number]   LOS: 11 days   Subjective: Left knee hurting again. Felt better after aspiration/injection but getting worse again.   Objective: Vital signs in last 24 hours: Temp:  [97.8 F (36.6 C)-98.7 F (37.1 C)] 98.4 F (36.9 C) (05/28 0759) Pulse Rate:  [51-101] 94 (05/28 0853) Resp:  [14-18] 16 (05/27 1938) BP: (71-103)/(36-69) 90/69 (05/28 0759) SpO2:  [92 %-100 %] 98 % (05/28 0759) Weight:  [86.7 kg (191 lb 2.2 oz)-87.9 kg (193 lb 12.6 oz)] 87.9 kg (193 lb 12.6 oz) (05/28 0341) Last BM Date: 03/31/18   Laboratory  CBC Recent Labs    03/30/18 0512 03/31/18 0740  WBC 7.7 7.9  HGB 7.5* 7.2*  HCT 23.1* 22.6*  PLT 121* 126*   BMET Recent Labs    03/30/18 0357 03/31/18 0748  NA 137 135  K 3.9 4.0  CL 104 100*  CO2 26 28  GLUCOSE 58* 163*  BUN 32* 21*  CREATININE 4.87* 3.86*  CALCIUM 7.2* 7.1*     Physical Exam General appearance: alert and no distress  Left knee: Mod-severe effusion, mod TTP lateral aspect, stable   Assessment/Plan: Left knee effusion -- Will apply compression and give short course Prednisone.    Lisette Abu, PA-C Orthopedic Surgery [phone] 03/31/2018

## 2018-03-31 NOTE — Progress Notes (Signed)
LVAD Coordinator Rounding Note:  Re-admitted from [facility] on 03/20/18 with continuing GI bleed.    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 Darcey Nora under Destination Therapy criteria due to age excluding heart transplantation.  Pt sitting in chair; states he is ready to go to [facility] when his bed ready. Pt states he is ready for surgery if that is what it will take to stop the bleeding.  Vital signs: Temp:  98.6 HR: 76 Doppler Pressure:  78 Automatic BP:  90/69 (77) O2 Sat: 95% RA Wt: 197>194>192>190>196>201>193>204>193lbs  LVAD interrogation reveals:  Speed:  9200 Flow:  6.4 Power: 6.1 w PI: 3.7 Alarms: none Events:  4-5 PI events on 03/26/18 Fixed speed: 9200 Low speed limit: 8600  Primary Controller: Replace back up battery in 7 months Back up controller: Replace back up battery in 55months   Drive Line: Existing VAD dressing removed and site care performed using sterile technique. Drive line exit site cleaned with Chlora prep applicator x 1 and rinsed with saline x 2, allowed to dry, and Sorbaview dressing without bio patch re-applied. Exit site healed and incorporated, the velour is fully implanted at exit site. No redness, tenderness, drainage, foul odor or rash noted. Drive line anchor re-applied.   Weekly dressing changes per bedside RN - next dressing change due 04/07/18.  Labs:  LDH trend: 224>191>174>223>224>206>186>193  INR trend: coumadin stopped  Hgb trend: 9.5>8.2>7.2>5.5>8.0>8.1>7.9>8.1>7.9>7.5>7.2  Anticoagulation Plan: - INR Goal: coumadin stopped - ASA Dose: none due to hx of GI bleed - OP monthly Octreotide for hx of GI bleed  Blood Products:  - 12 units PC's while at [place] - 03/22/18> 2 units PC's - 03/23/18> 2 unit PC - 03/24/18> 2 unit PC - 03/25/18> 2 unit PC -03/26/18> plan for 1 unit PC -03/27/18> 2 unit PCs -03/29/18> 1 unit PCs -03/30/18> 1 unit PCs -03/31/18> 1 unit PCs   Device: - Medtronic single lead -Therapies: on 231 bpm  Arrythmias:  chronic afib  Renal:  - chronic HD on M/W/F/Sat at [facility]  Adverse Events on VAD: - 02/2017> GIB- AVM clipped x2 - 07/2017> renal failure- HD started - 11/2017>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 Margins not clear at neck. - 02/23/18> hospitalization for GI bleed. Colon/EGD 02/27/18 with normal EGD. Colonoscopy identified active bleeding in ileum; source not found. Capsule Endoscopy 02/27/18: Showed active bleeding in the ileum. Received 5 units blood with histamine reaction; will give benadryl as OP. - 03/10/18>transferrred to [facility] for double retro balloon scop and sent to IR emolization x 3 with no evidence of bleeding. Received 12 units PCs, remained off anticoagulants. Transferred back to [facility] 03/20/18 Seen by IR 5/20. CTA obtained, but no active bleeding found. Started on octreotide.  03/25/18: Repeat capsule endoscopy completed. Bleeding appears to come from the mid-ileum.  03/26/18: General surgery reconsulted. Recommended transfer back to [facility] for surgery with intraoperative endoscopy. [name] currently discussing options.  Ortho consulted 5/24 for left knee pain. S/p knee aspiration and steroid injection.   Dose of premarin given 5/24.   On 5/25 developed abdominal bloating and distension with n/v. KUB suggestive of ileus versus early SBO. NGT placed for decompression.  Had large bloody BM after this and felt much better, diet now advanced to regular.     Plan/Recommendations: 1.  Weekly dressing changes with special instructions (above). Bedside RN may change dressing; next due 04/07/18. 2.  Waiting to hear back from Dr. Posey Pronto regarding transfer to  [name]-maybe later this week. 3. Continue Octreotide drip. 4.  Call VAD pager if any questions re: VAD equipment or drive line site issues.   [name], VAD Coordinator 24/7 VAD pager: [phone]

## 2018-04-01 ENCOUNTER — Inpatient Hospital Stay (HOSPITAL_COMMUNITY): Payer: Medicare Other

## 2018-04-01 LAB — LACTATE DEHYDROGENASE: LDH: 180 U/L (ref 98–192)

## 2018-04-01 LAB — HEMOGLOBIN AND HEMATOCRIT, BLOOD
HEMATOCRIT: 29 % — AB (ref 39.0–52.0)
HEMOGLOBIN: 9.7 g/dL — AB (ref 13.0–17.0)

## 2018-04-01 LAB — CBC WITH DIFFERENTIAL/PLATELET
ABS IMMATURE GRANULOCYTES: 0.3 10*3/uL — AB (ref 0.0–0.1)
BASOS ABS: 0 10*3/uL (ref 0.0–0.1)
Basophils Relative: 0 %
EOS PCT: 0 %
Eosinophils Absolute: 0 10*3/uL (ref 0.0–0.7)
HCT: 23.2 % — ABNORMAL LOW (ref 39.0–52.0)
HEMOGLOBIN: 7.6 g/dL — AB (ref 13.0–17.0)
Immature Granulocytes: 4 %
LYMPHS PCT: 7 %
Lymphs Abs: 0.6 10*3/uL — ABNORMAL LOW (ref 0.7–4.0)
MCH: 28.3 pg (ref 26.0–34.0)
MCHC: 32.8 g/dL (ref 30.0–36.0)
MCV: 86.2 fL (ref 78.0–100.0)
MONO ABS: 0.3 10*3/uL (ref 0.1–1.0)
MONOS PCT: 3 %
NEUTROS ABS: 7.4 10*3/uL (ref 1.7–7.7)
Neutrophils Relative %: 86 %
Platelets: 126 10*3/uL — ABNORMAL LOW (ref 150–400)
RBC: 2.69 MIL/uL — ABNORMAL LOW (ref 4.22–5.81)
RDW: 19.1 % — ABNORMAL HIGH (ref 11.5–15.5)
WBC: 8.6 10*3/uL (ref 4.0–10.5)

## 2018-04-01 LAB — RENAL FUNCTION PANEL
Albumin: 1.9 g/dL — ABNORMAL LOW (ref 3.5–5.0)
Anion gap: 8 (ref 5–15)
BUN: 30 mg/dL — ABNORMAL HIGH (ref 6–20)
CALCIUM: 7.1 mg/dL — AB (ref 8.9–10.3)
CO2: 27 mmol/L (ref 22–32)
Chloride: 100 mmol/L — ABNORMAL LOW (ref 101–111)
Creatinine, Ser: 4.52 mg/dL — ABNORMAL HIGH (ref 0.61–1.24)
GFR calc non Af Amer: 12 mL/min — ABNORMAL LOW (ref >60)
GFR, EST AFRICAN AMERICAN: 14 mL/min — AB (ref >60)
GLUCOSE: 237 mg/dL — AB (ref 65–99)
PHOSPHORUS: 4.3 mg/dL (ref 2.5–4.6)
POTASSIUM: 4.8 mmol/L (ref 3.5–5.1)
SODIUM: 135 mmol/L (ref 135–145)

## 2018-04-01 LAB — PROTIME-INR
INR: 1.2
Prothrombin Time: 15.1 seconds (ref 11.4–15.2)

## 2018-04-01 LAB — GLUCOSE, CAPILLARY
GLUCOSE-CAPILLARY: 216 mg/dL — AB (ref 65–99)
Glucose-Capillary: 196 mg/dL — ABNORMAL HIGH (ref 65–99)

## 2018-04-01 LAB — PREPARE RBC (CROSSMATCH)

## 2018-04-01 MED ORDER — INSULIN GLARGINE 100 UNIT/ML ~~LOC~~ SOLN: 8.0000 [IU] | Freq: Every day | SUBCUTANEOUS | 11 refills | Status: DC

## 2018-04-01 MED ORDER — SODIUM CHLORIDE 0.9 % IV SOLN: 50.0000 ug/h | INTRAVENOUS | Status: DC

## 2018-04-01 MED ORDER — ALTEPLASE 2 MG IJ SOLR: 2.0000 mg | Freq: Once | INTRAMUSCULAR | Status: DC | PRN

## 2018-04-01 MED ORDER — SODIUM CHLORIDE 0.9 % IV SOLN: 100.0000 mL | INTRAVENOUS | Status: DC | PRN

## 2018-04-01 MED ORDER — METOCLOPRAMIDE HCL 10 MG/10ML PO SOLN: 5.0000 mg | Freq: Three times a day (TID) | ORAL | 0 refills | Status: DC

## 2018-04-01 MED ORDER — LIDOCAINE-PRILOCAINE 2.5-2.5 % EX CREA
1.0000 "application " | TOPICAL_CREAM | CUTANEOUS | Status: DC | PRN
  Filled 2018-04-01: qty 5

## 2018-04-01 MED ORDER — DIPHENHYDRAMINE HCL 25 MG PO CAPS: 25.0000 mg | ORAL_CAPSULE | ORAL | 0 refills | Status: DC | PRN

## 2018-04-01 MED ORDER — HEPARIN SODIUM (PORCINE) 1000 UNIT/ML DIALYSIS: 1000.0000 [IU] | INTRAMUSCULAR | Status: DC | PRN

## 2018-04-01 MED ORDER — MOMETASONE FURO-FORMOTEROL FUM 200-5 MCG/ACT IN AERO: 2.0000 | INHALATION_SPRAY | Freq: Two times a day (BID) | RESPIRATORY_TRACT | Status: DC

## 2018-04-01 MED ORDER — DARBEPOETIN ALFA 200 MCG/0.4ML IJ SOSY: 200.0000 ug | PREFILLED_SYRINGE | INTRAMUSCULAR | Status: AC

## 2018-04-01 MED ORDER — LIDOCAINE HCL (PF) 1 % IJ SOLN: 5.0000 mL | INTRAMUSCULAR | Status: DC | PRN

## 2018-04-01 MED ORDER — LIDOCAINE HCL (PF) 1 % IJ SOLN
5.0000 mL | INTRAMUSCULAR | Status: DC | PRN
Start: 2018-04-01 — End: 2018-04-01

## 2018-04-01 MED ORDER — PREDNISONE 20 MG PO TABS: 60.0000 mg | ORAL_TABLET | Freq: Every day | ORAL | Status: DC

## 2018-04-01 MED ORDER — PENTAFLUOROPROP-TETRAFLUOROETH EX AERO
1.0000 "application " | INHALATION_SPRAY | CUTANEOUS | Status: DC | PRN
  Filled 2018-04-01: qty 103.5

## 2018-04-01 MED ORDER — GUAIFENESIN 100 MG/5ML PO SOLN: 5.0000 mL | ORAL | 0 refills | Status: DC | PRN

## 2018-04-01 MED ORDER — SODIUM CHLORIDE 0.9 % IV SOLN: Freq: Once | INTRAVENOUS | Status: DC

## 2018-04-01 MED ORDER — LIDOCAINE-PRILOCAINE 2.5-2.5 % EX CREA: 1.0000 "application " | TOPICAL_CREAM | CUTANEOUS | Status: DC | PRN

## 2018-04-01 MED ORDER — GUAIFENESIN 100 MG/5ML PO SOLN
5.0000 mL | ORAL | Status: DC | PRN
  Administered 2018-04-01: 100 mg via ORAL
  Filled 2018-04-01: qty 5

## 2018-04-01 MED ORDER — ONDANSETRON HCL 4 MG/2ML IJ SOLN: 4.0000 mg | Freq: Four times a day (QID) | INTRAMUSCULAR | 0 refills | Status: DC | PRN

## 2018-04-01 MED ORDER — PENTAFLUOROPROP-TETRAFLUOROETH EX AERO
1.0000 "application " | INHALATION_SPRAY | CUTANEOUS | Status: DC | PRN
  Administered 2018-04-01: 1 via TOPICAL

## 2018-04-01 NOTE — Progress Notes (Signed)
LVAD Coordinator Rounding Note:  Re-admitted from [facility] on 03/20/18 with continuing GI bleed.    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 Darcey Nora under Destination Therapy criteria due to age excluding heart transplantation.  Pt in bed having dialysis treatment; states he is ready to go to [facility] when his bed ready. Pt states he is ready for surgery if that is what it will take to stop the bleeding. Per contact at [facility] pt may be able to transfer in the next 24 hours. Pt states that he had 3 bloody BMs overnight, he continues to c/o knee pain.  Bedside RN is concerned that pt is having some dysarthria. This information was passes to the providers. Head CT w/wo was ordered.  Vital signs: Temp:  98.1 HR: 85 Doppler Pressure:  82 Automatic BP:  81/68 (74) O2 Sat: 98% RA Wt: 197>194>192>190>196>201>193>204>193>199lbs  LVAD interrogation reveals:  Speed:  9200 Flow:  5.1 Power: 5.4 w PI: 4.6 Alarms: none Events:  35+ PI-pt currently undergoing dialysis Fixed speed: 9200 Low speed limit: 8600  Primary Controller: Replace back up battery in 7 months Back up controller: Replace back up battery in 53month   Drive Line: Weekly dressing changes per bedside RN - next dressing change due 04/07/18. Saline only, no bio-patch using weekly kit.   Labs:  LDH trend: 224>191>174>223>224>206>186>193>180  INR trend: coumadin stopped  Hgb trend: 9.5>8.2>7.2>5.5>8.0>8.1>7.9>8.1>7.9>7.5>7.2>7.6 Anticoagulation Plan: - INR Goal: coumadin stopped - ASA Dose: none due to hx of GI bleed - OP monthly Octreotide for hx of GI bleed  Blood Products:  - 12 units PC's while at [place]- 03/22/18> 2 units PC's - 03/23/18> 2 unit PC - 03/24/18> 2 unit PC - 03/25/18> 2 unit PC -03/26/18> plan for 1 unit PC -03/27/18> 2 unit PCs -03/29/18> 1 unit PCs -03/30/18> 1 unit PCs -03/31/18> 2 unit PCs -04/01/18> 2 unit PCs  Device: - Medtronic single lead -Therapies: on 231 bpm  Arrythmias: chronic afib  Renal:  -  [facility] Adverse Events on VAD: - 02/2017> GIB- AVM clipped x2 - 07/2017> renal failure- HD started - 11/2017>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 Margins not clear at neck. - 02/23/18> hospitalization for GI bleed. Colon/EGD 02/27/18 with normal EGD. Colonoscopy identified active bleeding in ileum; source not found. Capsule Endoscopy 02/27/18: Showed active bleeding in the ileum. Received 5 units blood with histamine reaction; will give benadryl as OP. - [facility] to IR emolization x 3 with no evidence of bleeding. Received 12 units PCs, remained off anticoagulants. [facility] Seen by IR 5/20. CTA obtained, but no active bleeding found. Started on octreotide.  03/25/18: Repeat capsule endoscopy completed. Bleeding appears to come from the mid-ileum.  03/26/18: General surgery reconsulted. [name].  Ortho consulted 5/24 for left knee pain. S/p knee aspiration and steroid injection.   Dose of premarin given 5/24.   On 5/25 developed abdominal bloating and distension with n/v. KUB suggestive of ileus versus early SBO. NGT placed for decompression.  Had large bloody BM after this and felt much better, diet now advanced to regular.     Plan/Recommendations: 1.  Weekly dressing changes with special instructions (above). Bedside RN may change dressing; next due 04/07/18. 2. [facility] 3. Continue Octreotide drip. 4.  Call VAD pager if any questions re: VAD equipment or drive line site issues.   [name], VAD Coordinator 24/7 VAD pager: [phone]

## 2018-04-01 NOTE — Care Management Note (Signed)
Case Management Note  Patient Details  Name: RILYN UPSHAW MRN: [id number] Date of Birth: 08/15/1946  Subjective/Objective:       Pt admitted from [facility] with continuous GI bleed             Action/Plan:  PTA independent from home, on outpt HD.  Pt has PCP and current prescription insurance.  CM will continue to follow   Expected Discharge Date:  04/01/18               Expected Discharge Plan:  [place]  In-House Referral:     Discharge planning Services  CM Consult  Post Acute Care Choice:    Choice offered to:     DME Arranged:    DME Agency:     HH Arranged:    [place] Agency:     Status of Service:  In process, will continue to follow  If discussed at Long Length of Stay Meetings, dates discussed:    Additional Comments: 04/01/2018  Pt will transport to [place] [facility] to hospital transfer facilities by unit staff  03/26/18 Pt continues to bleed - family meeting scheduled 5/24 [name], RN 04/01/2018, 3:19 PM

## 2018-04-01 NOTE — Progress Notes (Signed)
[facility] Progress Note  Subjective: seen on hd, tired getting 2 units prbc's goal Hb > 8  Vitals:   04/01/18 1100 04/01/18 1115 04/01/18 1130 04/01/18 1145  BP: (!) 72/51 (!) 68/56 (!) 75/57 (!) 73/59  Pulse: 78 68 81 78  Resp: 20 18 18 18   Temp:      TempSrc:      SpO2:      Weight:      Height:        Inpatient medications: . atorvastatin  40 mg Oral q1800  . busPIRone  5 mg Oral BID  . calcitRIOL  0.5 mcg Oral Q M,W,F-HD  . Chlorhexidine Gluconate Cloth  6 each Topical Q0600  . danazol  100 mg Oral BID  . [START ON 04/06/2018] darbepoetin (ARANESP) injection - DIALYSIS  200 mcg Intravenous Q Mon-HD  . feeding supplement  1 Container Oral TID BM  . feeding supplement (GLUCERNA SHAKE)  237 mL Oral TID BM  . gabapentin  300 mg Oral BID  . insulin aspart  0-9 Units Subcutaneous TID WC  . insulin glargine  8 Units Subcutaneous QHS  . levothyroxine  25 mcg Oral QAC breakfast  . magnesium oxide  400 mg Oral Daily  . metoCLOPramide  5 mg Oral TID AC & HS  . mometasone-formoterol  2 puff Inhalation BID  . multivitamin  1 tablet Oral QHS  . pantoprazole  40 mg Oral Daily  . predniSONE  60 mg Oral Q breakfast  . sildenafil  40 mg Oral TID   . sodium chloride    . sodium chloride    . sodium chloride    . sodium chloride    . octreotide  (SANDOSTATIN)    IV infusion 50 mcg/hr (03/31/18 2000)   sodium chloride, sodium chloride, acetaminophen, albuterol, alteplase, diphenhydrAMINE, heparin, lidocaine (PF), lidocaine-prilocaine, ondansetron (ZOFRAN) IV, oxyCODONE-acetaminophen, pentafluoroprop-tetrafluoroeth, traMADol, traZODone  Exam:  alert no distress lying flat on hd  no jvd  chest cta bilat  cor reg +lvad hum   abd soft ntnd no ascites  ext 2+ bilat leg edema and ue edema  lua avf +bruit  nonfocal ox 3   Dialysis: [facility]  4.5h  83kg 2/2 bath heparin none  lua avf  - mircera 100 every 2 wks last 4/17  - calc 0.5 ug tiw      Impression: 1 GI  bleed - hx avm's, capsule endo w active bleeding mid-ileum, poss surgery at [facility] soon 2 CHF/ CM sp LVAD - f/b HF team 3 esrd on hd mwf + sat  4 volume - up 7kg today, goal 3kg w/ hd today has extra hd sat hopefully can get extra vol then 5 afib - per cards 6 hx TAVR 7 mbd ckd - cont vdra, phos 2-4 without binders 8 anemia of abl/ ckd - sp multiple prbc's, cont darbe ^'d to 200/ wk  Plan - dialysis today   Kelly Splinter MD [facility] pager [phone]   04/01/2018, 12:08 PM   Recent Labs  Lab 03/30/18 0357 03/31/18 0748 04/01/18 0333  NA 137 135 135  K 3.9 4.0 4.8  CL 104 100* 100*  CO2 26 28 27   GLUCOSE 58* 163* 237*  BUN 32* 21* 30*  CREATININE 4.87* 3.86* 4.52*  CALCIUM 7.2* 7.1* 7.1*  PHOS 3.8 3.1 4.3   Recent Labs  Lab 03/30/18 0357 03/31/18 0748 04/01/18 0333  ALBUMIN 2.1* 2.0* 1.9*   Recent Labs  Lab 03/30/18 0512 03/31/18  0740 03/31/18 2026 04/01/18 0333  WBC 7.7 7.9  --  8.6  NEUTROABS  --   --   --  7.4  HGB 7.5* 7.2* 8.2* 7.6*  HCT 23.1* 22.6* 25.1* 23.2*  MCV 89.2 89.3  --  86.2  PLT 121* 126*  --  126*   Iron/TIBC/Ferritin/ %Sat    Component Value Date/Time   IRON 125 06/14/2017 1605   TIBC 221 (L) 06/14/2017 1605   FERRITIN 622 (H) 06/14/2017 1605   IRONPCTSAT 57 (H) 06/14/2017 1605

## 2018-04-01 NOTE — Progress Notes (Signed)
Inpatient Diabetes Program Recommendations  AACE/ADA: New Consensus Statement on Inpatient Glycemic Control (2015)  Target Ranges:  Prepandial:   less than 140 mg/dL      Peak postprandial:   less than 180 mg/dL (1-2 hours)      Critically ill patients:  140 - 180 mg/dL   Lab Results  Component Value Date   GLUCAP 216 (H) 04/01/2018   HGBA1C 7.5 09/03/2017    Review of Glycemic Control Results for Trevor [name], Trevor [name] (MRN [id number]) as of 04/01/2018 11:43  Ref. Range 03/31/2018 17:05 03/31/2018 21:26 04/01/2018 07:50  Glucose-Capillary Latest Ref Range: 65 - 99 mg/dL 219 (H) 275 (H) 216 (H)   Diabetes history: Type 2 DM Outpatient Diabetes medications: Lantus 15 units QHS Current orders for Inpatient glycemic control: Lantus 8 units QHS, Novolog 0-15 units TID  Inpatient Diabetes Program Recommendations:    Of note patient was started on Prednisone 60 mg QAM and is s/p steroid injection to knee, thus will see BS trend up.  In the setting of steroids, would recommend increasing Lantus to 18 units QHS (90.45 kg x 0.2).  Additionally, patient on regular diet, consider switching to carb modified?  Thanks, Bronson Curb, MSN, RNC-OB Diabetes Coordinator [phone] (8a-5p)

## 2018-04-01 NOTE — Progress Notes (Addendum)
Patient ID: Trevor [name], male   DOB: 02-Oct-1946, 72 y.o.   MRN: [id number]   Advanced Heart Failure VAD Team Note  PCP-Cardiologist: No primary care provider on file.   Subjective:    GI events  2018 colonoscopy. Blood throughout colon, but not in TI. Oozing tiny AVMS at hepatic flexure, treated with APC, endoclips. Three small small polyps not removed. Left colon tics.  02/27/18 EGD. Normal, no bleeding.  02/27/18 Colonoscopy. Blood in TI, sugg of SB source. Blood throughout colon, no source found. Descending and sigmoid tics. Multiple small polyps of ascending to sigmoid, not removed.  02/28/18 Capsule endo. Fresh blood at ileum, starting at 6 hours 58 mins. Blood obscured identification of lesion. Blood throughout colon. Per Dr Marin Shutter "Ileum (where bleeding) not reachable locally, and likely not reachable by anterograde double balloon enteroscopy at [facility]".Transfused 5 U PRBCs during admission.  At discharge continued on monthly octreotide and started Doxycycline 100 bid to try to limit AVM bleeding (and for URI sx)with plannedtransition to danazol. Goal INR decreased to 1.8 - 2.3 03/09/18: Admitted with recurrent GI bleed. Coumadin was stopped. He received 3UPRBCs. Transferred to [facility] for deep entersospcopy on 03/10/2018. Hospital course at [facility] included double retro ballon scope and he was sent for  IR embolization x 3 but he had no evidence of bleeding. Received 12 UPRBCs. He remained off anticoagulants.    Seen by IR 5/20. CTA obtained, but no active bleeding found. Started on octreotide.  03/25/18: Repeat capsule endoscopy completed. Bleeding appears to come from the mid-ileum.  03/26/18: General surgery reconsulted. Recommended transfer back to [facility] for surgery with intraoperative endoscopy. [name] currently discussing options.  Ortho consulted 5/24 for left knee pain. S/p knee aspiration and steroid injection.   Remains off warfarin , INR 1.20  Dose of premarin given  5/24.   On 5/25 developed abdominal bloating and distension with n/v. KUB suggestive of ileus versus early SBO. NGT placed for decompression.  Had large bloody BM after this and felt much better, diet now advanced to regular.    Started on prednisone 5/28 for recurring left knee pain.   Hemoglobin 7.6 this morning. Getting HD currently. 3 bloody BM's overnight. Denies CP or SOB.   RBCs 5/20 2U 5/21 1U  5/22 1U  5/23 1U 5/24 2U 5/26 2U 5/27 1u 5/28 2u  LVAD INTERROGATION:  HeartMate 2 LVAD:   Flow 5.6  liters/min, speed 9200, power 5.8, PI 4.0. 16 PI events.    Objective:    Vital Signs:   Temp:  [97.5 F (36.4 C)-99.3 F (37.4 C)] 98.2 F (36.8 C) (05/29 0319) Pulse Rate:  [47-96] 71 (05/29 0319) Resp:  [14-18] 14 (05/29 0319) BP: (69-92)/(55-81) 92/81 (05/29 0319) SpO2:  [94 %-100 %] 96 % (05/29 0319) Weight:  [199 lb 11.8 oz (90.6 kg)] 199 lb 11.8 oz (90.6 kg) (05/29 0506) Last BM Date: 03/31/18 Mean arterial Pressure 70s  Intake/Output:   Intake/Output Summary (Last 24 hours) at 04/01/2018 0725 Last data filed at 04/01/2018 0600 Gross per 24 hour  Intake 2470 ml  Output -  Net 2470 ml     Physical Exam   GENERAL: Well appearing this am. NAD.  HEENT: Normal. NECK: Supple, JVP 8-9 cm. Carotids OK.  CARDIAC:  Mechanical heart sounds with LVAD hum present.  LUNGS:  CTAB, normal effort.  ABDOMEN:  NT, ND, no HSM. No bruits or masses. +BS  LVAD exit site: Dressing dry and intact. No erythema or drainage. Stabilization device  present and accurately applied. Driveline dressing changed daily per sterile technique. EXTREMITIES:  Warm and dry. No cyanosis, clubbing, rash. 1+ ankle edema NEUROLOGIC:  Alert & oriented x 3. Cranial nerves grossly intact. Moves all 4 extremities w/o difficulty. Affect pleasant    Telemetry   Afib 70-80s. Personally reviewed.   Labs   Basic Metabolic Panel: Recent Labs  Lab 03/26/18 0258 03/27/18 0309 03/28/18 0513  03/30/18 0357 03/31/18 0748 04/01/18 0333  NA 136 134* 139 137 135 135  K 4.0 4.5 3.6 3.9 4.0 4.8  CL 101 100* 102 104 100* 100*  CO2 29 27 29 26 28 27   GLUCOSE 222* 214* 124* 58* 163* 237*  BUN 14 22* 15 32* 21* 30*  CREATININE 3.04* 4.15* 2.87* 4.87* 3.86* 4.52*  CALCIUM 7.1* 7.1* 7.1* 7.2* 7.1* 7.1*  MG 1.9  --   --   --   --   --   PHOS 2.2* 3.2 2.6 3.8 3.1 4.3    Liver Function Tests: Recent Labs  Lab 03/27/18 0309 03/28/18 0513 03/30/18 0357 03/31/18 0748 04/01/18 0333  ALBUMIN 2.1* 2.3* 2.1* 2.0* 1.9*   No results for input(s): LIPASE, AMYLASE in the last 168 hours. No results for input(s): AMMONIA in the last 168 hours.  CBC: Recent Labs  Lab 03/29/18 0202 03/29/18 1524 03/30/18 0512 03/31/18 0740 03/31/18 2026 04/01/18 0333  WBC 9.7 9.1 7.7 7.9  --  8.6  NEUTROABS  --   --   --   --   --  7.4  HGB 7.5* 8.6* 7.5* 7.2* 8.2* 7.6*  HCT 23.5* 26.8* 23.1* 22.6* 25.1* 23.2*  MCV 92.9 90.2 89.2 89.3  --  86.2  PLT 124* 127* 121* 126*  --  126*    INR: Recent Labs  Lab 03/28/18 0513 03/29/18 0202 03/30/18 0357 03/31/18 0347 04/01/18 0333  INR 1.34 1.32 1.37 1.35 1.20    Other results:  EKG:    Imaging   No results found.   Medications:     Scheduled Medications: . atorvastatin  40 mg Oral q1800  . busPIRone  5 mg Oral BID  . calcitRIOL  0.5 mcg Oral Q M,W,F-HD  . Chlorhexidine Gluconate Cloth  6 each Topical Q0600  . danazol  100 mg Oral BID  . [START ON 04/06/2018] darbepoetin (ARANESP) injection - DIALYSIS  200 mcg Intravenous Q Mon-HD  . feeding supplement  1 Container Oral TID BM  . feeding supplement (GLUCERNA SHAKE)  237 mL Oral TID BM  . gabapentin  300 mg Oral BID  . insulin aspart  0-9 Units Subcutaneous TID WC  . insulin glargine  8 Units Subcutaneous QHS  . levothyroxine  25 mcg Oral QAC breakfast  . magnesium oxide  400 mg Oral Daily  . metoCLOPramide  5 mg Oral TID AC & HS  . mometasone-formoterol  2 puff Inhalation BID   . multivitamin  1 tablet Oral QHS  . pantoprazole  40 mg Oral Daily  . predniSONE  60 mg Oral Q breakfast  . sildenafil  40 mg Oral TID    Infusions: . sodium chloride    . octreotide  (SANDOSTATIN)    IV infusion 50 mcg/hr (03/31/18 2000)    PRN Medications: acetaminophen, albuterol, diphenhydrAMINE, ondansetron (ZOFRAN) IV, oxyCODONE-acetaminophen, traMADol, traZODone   Assessment/Plan:    1. GI Bleed/Symptomatic Anemia: GI AVMs. Double balloon retro endoscopy at [facility], unable to identify bleeding lesion.  He had failed attempted IR embolization x 3 while at [facility]. 12  units PRBCs at [facility]. He has had multiple units PRBCs here. Received 2U PRBCs 5/19. 5.5>8.1 Given 1U PRBCs 5/21, 2U PRBCs 5/22, 1U PRBC 5/23, 2U 5/24, 2U 5/26, 1U 5/27, 2U 5/28.  IR consulted, but did not find any active bleeding on CTA 5/20. GI consulted. Repeat capsule study showed active bleeding in the mid ileum. Hemoglobin 7.6 this am. - Use SCDs - Transfuse hgb < 8 => 2 units with HD today.  - Continue octreotide drip and continue danazol 100 mg bid. Received premarin 5/24 will not repeat at this point over concerns for increased risk of pump thrombosis. - Surgery consulted and recommended transfer back to [facility] for intra-operative endoscopy and surgical resection once source is found. Dr Aundra Dubin discussed with [name] and they will plan to take him back for surgical evaluation later this week.  2. Chronic Systolic Heart Failure/RV Failure: HMIII LVAD placed 2017.   - LDH stable at 180.  - VAD interrogated personally. Parameters stable. - Volume managed by HD. HD today.  - No bb with RV failure. Continue sildenafil 40 mg tid.  - Continue TED hose 3. ESRD- M/W/F/Sat. HD per Renal. No change.  4. Chronic A fib: Rates controlled. Off coumadin with GI bleed. 5. HTN: MAP 70s-80s generally. 6. Squamous Cell CA: s/p excision, margins at neck site not clear but has not wanted radiation. Left shin sore from excision. WOCN  consulted for recommendations. Vaseline gauze, daily dressing changes.  7. DMII - Continue SSI. No change.  8. H/O CAD CABG. No s/s ischemia.  9. H/O TAVR: Valve stable on last echo. No change.  10. NSVT - Quiescent. No change.  11. Left knee pain: Uric acid 3.3.  Xray showed effusion. Ortho consulted and aspirated + steroid injection 5/24. Knee remains painful and difficult to walk on, patient requests ortho reassess => ortho started on prednisone yesterday.  12. Abdominal distension: Suspect ileus, resolved. No change. No abdominal pain today.   I reviewed the LVAD parameters from today, and compared the results to the patient's prior recorded data.  No programming changes were made.  The LVAD is functioning within specified parameters.  The patient performs LVAD self-test daily.  LVAD interrogation was negative for any significant power changes, alarms or PI events/speed drops.  LVAD equipment check completed and is in good working order.  Back-up equipment present.   LVAD education done on emergency procedures and precautions and reviewed exit site care.  Length of Stay: [place], NP 04/01/2018, 7:25 AM  VAD Team --- VAD ISSUES ONLY--- Pager [phone] (7am - 7am)  Advanced Heart Failure Team  Pager [phone] (M-F; 7a - 4p)  Please contact [facility] for night-coverage after hours (4p -7a ) and weekends on [email or web address]  Patient seen with NP, agree with the above note.  He continues to have active bleeding.  Got another 2 units yesterday with hematochezia overnight.  Hgb 7.6 this morning.  Getting HD currently.  - Will transfuse 2 units PRBCs with HD.  - He remains off warfarin with increased risk for pump thrombosis but no other option with active bleeding.  - Awaiting transfer to [facility] for surgical evaluation, we are waiting for the ok to send him.   Pain in left knee, not gout.  Had aspiration/steroid injection last week.  More painful yesterday, ortho saw again and put on  prednisone course.  He was able to walk this morning.  Would use short burst, no more than 5 days or so.  Loralie Champagne 04/01/2018 7:56 AM

## 2018-04-01 NOTE — Progress Notes (Signed)
Patient transferred to [facility] via [place]- all forms and paperwork sent with providers, IVs kept in place, report given. Report called to nurse for room 3130. Transported via stretcher to ambulance.

## 2018-04-02 ENCOUNTER — Telehealth: Payer: Self-pay | Admitting: *Deleted

## 2018-04-02 LAB — TYPE AND SCREEN
ABO/RH(D): A NEG
Antibody Screen: NEGATIVE
UNIT DIVISION: 0
UNIT DIVISION: 0
UNIT DIVISION: 0
UNIT DIVISION: 0
Unit division: 0
Unit division: 0
Unit division: 0

## 2018-04-02 LAB — BPAM RBC
BLOOD PRODUCT EXPIRATION DATE: 201906032359
BLOOD PRODUCT EXPIRATION DATE: 201906092359
BLOOD PRODUCT EXPIRATION DATE: 201906112359
BLOOD PRODUCT EXPIRATION DATE: 201906172359
BLOOD PRODUCT EXPIRATION DATE: 201906172359
Blood Product Expiration Date: 201906112359
Blood Product Expiration Date: 201906182359
ISSUE DATE / TIME: 201905260724
ISSUE DATE / TIME: 201905270948
ISSUE DATE / TIME: 201905281520
ISSUE DATE / TIME: 201905290907
ISSUE DATE / TIME: 201905261144
ISSUE DATE / TIME: 201905281058
ISSUE DATE / TIME: 201905290941
UNIT TYPE AND RH: 600
UNIT TYPE AND RH: 600
Unit Type and Rh: 600
Unit Type and Rh: 600
Unit Type and Rh: 600
Unit Type and Rh: 600
Unit Type and Rh: 600

## 2018-04-02 NOTE — Telephone Encounter (Signed)
Pt was on TCM report admitted 5/17/19for  Left knee pain s/p aspiration and steroid injection per ortho. Pt Started on prednisone 03/31/18 will receive a total of 5 doses. Pt Examined today, 04/01/2018, and thought stable for discharge back to [facility] for consideration of surgery. Follow up care will be closely arranged in tandem with [name] VAD Team and [facility] VAD Team.../lmb

## 2018-04-08 ENCOUNTER — Encounter (HOSPITAL_COMMUNITY): Payer: Medicare Other

## 2018-04-08 MED ORDER — CALCITRIOL 0.25 MCG PO CAPS
0.50 | ORAL_CAPSULE | ORAL | Status: DC
Start: 2018-04-22 — End: 2018-04-08

## 2018-04-08 MED ORDER — INSULIN GLARGINE 100 UNIT/ML ~~LOC~~ SOLN
11.00 | SUBCUTANEOUS | Status: DC
Start: 2018-04-08 — End: 2018-04-08

## 2018-04-08 MED ORDER — GABAPENTIN 300 MG PO CAPS
300.00 | ORAL_CAPSULE | ORAL | Status: DC
Start: 2018-04-20 — End: 2018-04-08

## 2018-04-08 MED ORDER — ACETAMINOPHEN 325 MG PO TABS
650.00 | ORAL_TABLET | ORAL | Status: DC
Start: ? — End: 2018-04-08

## 2018-04-08 MED ORDER — FLUTICASONE PROPIONATE HFA 110 MCG/ACT IN AERO
2.00 | INHALATION_SPRAY | RESPIRATORY_TRACT | Status: DC
Start: 2018-04-30 — End: 2018-04-08

## 2018-04-08 MED ORDER — BUSPIRONE HCL 5 MG PO TABS
5.00 | ORAL_TABLET | ORAL | Status: DC
Start: 2018-04-20 — End: 2018-04-08

## 2018-04-08 MED ORDER — INSULIN LISPRO 100 UNIT/ML ~~LOC~~ SOLN
0.00 | SUBCUTANEOUS | Status: DC
Start: 2018-04-08 — End: 2018-04-08

## 2018-04-08 MED ORDER — VANCOMYCIN HCL IN NACL 1-0.9 GM/250ML-% IV SOLN
1.00 | INTRAVENOUS | Status: DC
Start: ? — End: 2018-04-08

## 2018-04-08 MED ORDER — DIPHENHYDRAMINE HCL 25 MG PO CAPS
25.00 | ORAL_CAPSULE | ORAL | Status: DC
Start: ? — End: 2018-04-08

## 2018-04-08 MED ORDER — GENERIC EXTERNAL MEDICATION
40.00 | Status: DC
Start: 2018-04-15 — End: 2018-04-08

## 2018-04-08 MED ORDER — GENERIC EXTERNAL MEDICATION
Status: DC
Start: ? — End: 2018-04-08

## 2018-04-08 MED ORDER — ATORVASTATIN CALCIUM 40 MG PO TABS
40.00 | ORAL_TABLET | ORAL | Status: DC
Start: 2018-04-20 — End: 2018-04-08

## 2018-04-08 MED ORDER — LEVOTHYROXINE SODIUM 25 MCG PO TABS
25.00 | ORAL_TABLET | ORAL | Status: DC
Start: 2018-04-21 — End: 2018-04-08

## 2018-04-08 MED ORDER — GENERIC EXTERNAL MEDICATION
1.00 | Status: DC
Start: ? — End: 2018-04-08

## 2018-04-08 MED ORDER — GENERIC EXTERNAL MEDICATION
1.00 | Status: DC
Start: 2018-04-08 — End: 2018-04-08

## 2018-04-08 MED ORDER — EPOETIN ALFA 10000 UNIT/ML IJ SOLN
10000.00 | INTRAMUSCULAR | Status: DC
Start: ? — End: 2018-04-08

## 2018-04-08 MED ORDER — MELATONIN 3 MG PO TABS
3.00 | ORAL_TABLET | ORAL | Status: DC
Start: ? — End: 2018-04-08

## 2018-04-08 MED ORDER — DEXTROSE 50 % IV SOLN
12.50 | INTRAVENOUS | Status: DC
Start: ? — End: 2018-04-08

## 2018-04-08 MED ORDER — HYDROMORPHONE HCL 1 MG/ML IJ SOLN
0.25 | INTRAMUSCULAR | Status: DC
Start: ? — End: 2018-04-08

## 2018-04-08 MED ORDER — DANAZOL 100 MG PO CAPS
100.00 | ORAL_CAPSULE | ORAL | Status: DC
Start: 2018-04-08 — End: 2018-04-08

## 2018-04-08 MED ORDER — OXYCODONE HCL 5 MG PO TABS
5.00 | ORAL_TABLET | ORAL | Status: DC
Start: ? — End: 2018-04-08

## 2018-04-08 MED ORDER — ALBUTEROL SULFATE HFA 108 (90 BASE) MCG/ACT IN AERS
2.00 | INHALATION_SPRAY | RESPIRATORY_TRACT | Status: DC
Start: ? — End: 2018-04-08

## 2018-04-09 ENCOUNTER — Encounter (HOSPITAL_COMMUNITY): Payer: Medicare Other

## 2018-04-14 ENCOUNTER — Encounter (HOSPITAL_COMMUNITY): Payer: Medicare Other

## 2018-04-15 MED ORDER — DEXTROSE 50 % IV SOLN
12.50 | INTRAVENOUS | Status: DC
Start: ? — End: 2018-04-15

## 2018-04-15 MED ORDER — LIDOCAINE 5 % EX PTCH
2.00 | MEDICATED_PATCH | CUTANEOUS | Status: DC
Start: 2018-04-21 — End: 2018-04-15

## 2018-04-15 MED ORDER — GENERIC EXTERNAL MEDICATION
Status: DC
Start: ? — End: 2018-04-15

## 2018-04-15 MED ORDER — SENNOSIDES-DOCUSATE SODIUM 8.6-50 MG PO TABS
2.00 | ORAL_TABLET | ORAL | Status: DC
Start: 2018-04-20 — End: 2018-04-15

## 2018-04-15 MED ORDER — SODIUM CHLORIDE 0.9 % IV SOLN
INTRAVENOUS | Status: DC
Start: ? — End: 2018-04-15

## 2018-04-15 MED ORDER — GENERIC EXTERNAL MEDICATION
1.00 | Status: DC
Start: 2018-04-20 — End: 2018-04-15

## 2018-04-15 MED ORDER — POLYETHYLENE GLYCOL 3350 17 G PO PACK
17.00 | PACK | ORAL | Status: DC
Start: 2018-04-21 — End: 2018-04-15

## 2018-04-15 MED ORDER — ACETAMINOPHEN 325 MG PO TABS
975.00 | ORAL_TABLET | ORAL | Status: DC
Start: 2018-04-20 — End: 2018-04-15

## 2018-04-15 MED ORDER — INSULIN LISPRO 100 UNIT/ML ~~LOC~~ SOLN
0.00 | SUBCUTANEOUS | Status: DC
Start: 2018-04-30 — End: 2018-04-15

## 2018-04-15 MED ORDER — NALOXONE HCL 0.4 MG/ML IJ SOLN
0.40 | INTRAMUSCULAR | Status: DC
Start: ? — End: 2018-04-15

## 2018-04-16 ENCOUNTER — Encounter: Payer: Self-pay | Admitting: *Deleted

## 2018-04-17 MED ORDER — GENERIC EXTERNAL MEDICATION
Status: DC
Start: ? — End: 2018-04-17

## 2018-04-20 ENCOUNTER — Telehealth: Payer: Self-pay | Admitting: Emergency Medicine

## 2018-04-20 MED ORDER — BISACODYL 5 MG PO TBEC
10.00 | DELAYED_RELEASE_TABLET | ORAL | Status: DC
Start: ? — End: 2018-04-20

## 2018-04-20 MED ORDER — GENERIC EXTERNAL MEDICATION
Status: DC
Start: ? — End: 2018-04-20

## 2018-04-20 MED ORDER — FENTANYL CITRATE (PF) 2500 MCG/50ML IJ SOLN
12.50 | INTRAMUSCULAR | Status: DC
Start: ? — End: 2018-04-20

## 2018-04-20 MED ORDER — OXYCODONE HCL 5 MG PO TABS
5.00 | ORAL_TABLET | ORAL | Status: DC
Start: ? — End: 2018-04-20

## 2018-04-20 NOTE — Telephone Encounter (Signed)
Called patient to schedule AWV. Patient will call back at later date to schedule. 

## 2018-04-27 MED ORDER — GENERIC EXTERNAL MEDICATION
40.00 | Status: DC
Start: 2018-04-30 — End: 2018-04-27

## 2018-04-27 MED ORDER — GENERIC EXTERNAL MEDICATION
12000.00 | Status: DC
Start: ? — End: 2018-04-27

## 2018-04-27 MED ORDER — ACETAMINOPHEN 325 MG PO TABS
975.00 | ORAL_TABLET | ORAL | Status: DC
Start: ? — End: 2018-04-27

## 2018-04-27 MED ORDER — BENZONATATE 100 MG PO CAPS
100.00 | ORAL_CAPSULE | ORAL | Status: DC
Start: ? — End: 2018-04-27

## 2018-04-27 MED ORDER — CALCITRIOL 1 MCG/ML PO SOLN
0.50 | ORAL | Status: DC
Start: 2018-05-01 — End: 2018-04-27

## 2018-04-27 MED ORDER — GENERIC EXTERNAL MEDICATION
1.00 | Status: DC
Start: 2018-04-30 — End: 2018-04-27

## 2018-04-27 MED ORDER — OXYCODONE HCL 5 MG PO TABS
5.00 | ORAL_TABLET | ORAL | Status: DC
Start: ? — End: 2018-04-27

## 2018-04-27 MED ORDER — SENNOSIDES-DOCUSATE SODIUM 8.6-50 MG PO TABS
2.00 | ORAL_TABLET | ORAL | Status: DC
Start: 2018-04-30 — End: 2018-04-27

## 2018-04-27 MED ORDER — GENERIC EXTERNAL MEDICATION
25.00 | Status: DC
Start: 2018-05-01 — End: 2018-04-27

## 2018-04-27 MED ORDER — NITROGLYCERIN 0.4 MG SL SUBL
0.40 | SUBLINGUAL_TABLET | SUBLINGUAL | Status: DC
Start: ? — End: 2018-04-27

## 2018-04-29 NOTE — H&P (Addendum)
Advanced Heart Failure VAD History and Physical Note   PCP-Cardiologist: No primary care provider on file.   Reason for Admission: GI Bleed  HPI:    Trevor Sidman Murphyis a 72 y.o.malewith a history of CAD s/p CABG x 4 2010, OSA, AS with TAVR 2015, ,ESRD on HD,DM2,chronicatrial fibrillation, asthma,GI bleed,and ischemic cardiomyopathy with Medtronic ICD.S/PHMII LVAD for Destination therapy on 12/14/2015.  GI events 2018 colonoscopy. Blood throughout colon, but not in TI. Oozing tiny AVMS at hepatic flexure, treated with APC, endoclips. Three small small polyps not removed. Left colon tics.  02/27/18 EGD. Normal, no bleeding.  02/27/18 Colonoscopy. Blood in TI, sugg of SB source. Blood throughout colon, no source found. Descending and sigmoid tics. Multiple small polyps of ascending to sigmoid, not removed.  02/28/18 Capsule endo. Fresh blood at ileum, starting at 6 hours 58 mins. Blood obscured identification of lesion. Blood throughout colon. Per Dr Marin Shutter "Ileum (where bleeding) not reachable locally, and likely not reachable by anterograde double balloon enteroscopy at [facility]".Transfused 5 U PRBCs during admission.  At discharge continued on monthly octreotide and started Doxycycline 100 bid to try to limit AVM bleeding (and for URI sx)with plannedtransition to danazol. Goal INR decreased to 1.8 - 2.3 03/09/18: Admitted with recurrent GI bleed. Coumadin was stopped. He received 3UPRBCs. Transferred to [facility] for deep entersospcopy on 03/10/2018. Hospital course at [facility] double retro ballon scope and he was sent for IR embolization x 3but he had no evidence of bleeding. Received12 UPRBCs. He remained off anticoagulants.   03/20/18: [facility] with no surgical options at [facility]. CTA 03/23/18 with no active bleeding, however repeat capsule study 03/25/18 showed active bleeding in mid illeum. General surgery team consulted and plan for transfer back to  [facility] for intra-operative endoscopy and surgical resection once source is found.  04/01/18: Transferred to [place]. S/p laparoscopic surgical enteroscopy on 6/7 and received 4 clips. Required 13 uPRBs total, but no further bleeding after surgery.   Admitted to [facility] 03/09/2018 with recurrent GI bleed. Coumadin was stopped. He received 3UPRBCs. Transferred to [facility] for deep entersospcopy on 03/10/2018.   Hospital course at [facility] included double retro ballon scope and he was sent for  IR embolization x 3 but he had no evidence of bleeding. Received 12 UPRBCs. He remained off anticoagulants.   Transferred back to [facility] 5/17. IR consulted CTA 03/23/18 with no active bleeding, however repeat capsule study 03/25/18 showed active bleeding in mid illeum. General surgery team consulted and recommended intra-operative endoscopy and surgical resection once source is found at [facility]. Received additional 15 uPRBCs. Course complicated by left knee pain requiring aspiration and steroid injection. He also had abdominal bloating and distension requiring NGT for decompression. Resolved after BM.   Transferred back to [facility] 04/01/18. He received 13 uPRBCs at [facility]. Underwent laparoscopic surgical enteroscopy on 6/7 and received 4 clips. He had no further bleeding post-op. Last octreotide injection 6/13. Danazol was DC'd. ASA and coumadin were not resumed. VAD parameters were stable.    Course complicated by AMS and concern for ischemic bowel. Improved with NGT decompression and bowel rest. Head CT negative. Blood cultures and UA were negative. Tolerated clears and thought stable for diet advancement.    Additionally, required multiple left knee aspirations and OR washouts. Aspirate cultures positive for staph epi and clostridium perfingen. Started on Vanc and narrowed to Ancef. Sutures to be reassessed by ortho to determine removal date. Blood cultures remained negative.  Returned to [facility] today for further management.  Denies CP, SOB,  orthopnea, or bleeding.   LVAD INTERROGATION:  HeartMate II LVAD:  Flow 4.3 liters/min, speed 9200, power 5, PI 7.7. Low speed: 8600  Home Medications Prior to Admission medications   Medication Sig Start Date End Date Taking? Authorizing Provider  acetaminophen (TYLENOL) 325 MG tablet Take 2 tablets (650 mg total) by mouth every 4 (four) hours as needed for headache or mild pain. 03/10/18   Shirley Friar, PA-C  albuterol (PROVENTIL HFA;VENTOLIN HFA) 108 (90 Base) MCG/ACT inhaler Inhale 2 puffs into the lungs every 4 (four) hours as needed for wheezing or shortness of breath. 02/10/18   Larey Dresser, MD  atorvastatin (LIPITOR) 40 MG tablet TAKE 1 TABLET(40 MG) BY MOUTH DAILY Patient taking differently: Take 40 mg by mouth daily at 6 PM.  09/02/17   Larey Dresser, MD  busPIRone (BUSPAR) 5 MG tablet Take 1 tablet (5 mg total) by mouth 2 (two) times daily. 01/22/18   Donivin Wirt, Shaune Pascal, MD  calcitRIOL (ROCALTROL) 0.5 MCG capsule Take 1 capsule (0.5 mcg total) by mouth every Monday, Wednesday, and Friday with hemodialysis. 03/11/18   Shirley Friar, PA-C  danazol (DANOCRINE) 100 MG capsule Take 1 capsule (100 mg total) by mouth 2 (two) times daily. 03/05/18   Larey Dresser, MD  Darbepoetin Alfa (ARANESP) 200 MCG/0.4ML SOSY injection Inject 0.4 mLs (200 mcg total) into the vein every Monday with hemodialysis. 04/06/18   Clegg, Amy D, NP  diphenhydrAMINE (BENADRYL) 25 mg capsule Take 1 capsule (25 mg total) by mouth as needed for allergies (30 min prior to blood transfusion). 04/01/18   Clegg, Amy D, NP  fluticasone (FLOVENT HFA) 110 MCG/ACT inhaler Inhale 2 puffs into the lungs 2 (two) times daily. 02/10/18   Larey Dresser, MD  gabapentin (NEURONTIN) 600 MG tablet Take 0.5 tablets (300 mg total) by mouth 2 (two) times daily. 07/23/17   Larey Dresser, MD  guaiFENesin (ROBITUSSIN) 100 MG/5ML SOLN Take 5 mLs (100 mg total) by mouth every 4 (four) hours as needed for cough or to loosen  phlegm. 04/01/18   Clegg, Amy D, NP  heparin 1000 unit/mL SOLN injection 1 mL (1,000 Units total) by Dialysis route as needed (in dialysis). 04/01/18   Clegg, Amy D, NP  insulin glargine (LANTUS) 100 UNIT/ML injection Inject 0.08 mLs (8 Units total) into the skin at bedtime. 04/01/18   Clegg, Amy D, NP  levothyroxine (SYNTHROID, LEVOTHROID) 25 MCG tablet Take 1 tablet (25 mcg total) by mouth daily before breakfast. 10/02/17   Larey Dresser, MD  magnesium oxide (MAG-OX) 400 MG tablet Take 400 mg by mouth daily.    [provider]  metoCLOPramide (REGLAN) 10 MG/10ML SOLN Take 5 mLs (5 mg total) by mouth 4 (four) times daily -  before meals and at bedtime. 04/01/18   Clegg, Amy D, NP  mometasone-formoterol (DULERA) 200-5 MCG/ACT AERO Inhale 2 puffs into the lungs 2 (two) times daily. 04/01/18   Clegg, Amy D, NP  multivitamin (RENA-VIT) TABS tablet Take 1 tablet by mouth at bedtime. 09/02/17   Larey Dresser, MD  octreotide 500 mcg in sodium chloride 0.9 % 250 mL Inject 50 mcg/hr into the vein continuous. 04/01/18   Clegg, Amy D, NP  ondansetron (ZOFRAN) 4 MG/2ML SOLN injection Inject 2 mLs (4 mg total) into the vein every 6 (six) hours as needed for nausea. 04/01/18   Clegg, Amy D, NP  pantoprazole (PROTONIX) 40 MG tablet Take 1 tablet (40  mg total) by mouth daily. 08/11/17   Clegg, Amy D, NP  predniSONE (DELTASONE) 20 MG tablet Take 3 tablets (60 mg total) by mouth daily with breakfast. 04/02/18   Clegg, Amy D, NP  sildenafil (REVATIO) 20 MG tablet Take 2 tablets (40 mg total) 3 (three) times daily by mouth. Patient taking differently: Take 40 mg by mouth See admin instructions. Take two tablets (40 mg) by mouth three times a day- midday, afternoon, and bedtime 09/11/17   Larey Dresser, MD  traMADol (ULTRAM) 50 MG tablet Take 1 tablet (50 mg total) by mouth every 12 (twelve) hours as needed. TAKE 1 TABLET BY MOUTH EVERY 6 HOURS AS NEEDED FOR MODERATE PAIN Patient taking differently: Take 50 mg by  mouth every 6 (six) hours as needed for moderate pain.  03/05/18   Clegg, Amy D, NP  traZODone (DESYREL) 100 MG tablet TAKE 1 TABLET BY MOUTH EVERY NIGHT AT BEDTIME AS NEEDED FOR SLEEP Patient taking differently: TAKE 1 TABLET BY MOUTH EVERY NIGHT AT BEDTIME 02/16/18   Larey Dresser, MD    Past Medical History: Past Medical History:  Diagnosis Date  . AICD (automatic cardioverter/defibrillator) present   . Asthma   . AVM (arteriovenous malformation) of colon 07/07/2017  . CHF (congestive heart failure) ([place])   . Diabetes ([place])   . ESRF (end stage renal failure) ([place]) 07/07/2017   pt receiving hemodialysis Monday- Wednesday-friday, sometimes Saturday  . Kidney disease   . LVAD (left ventricular assist device) present ([place]) 12/2016  . Permanent atrial fibrillation ([place]) 07/07/2017  . Presence of permanent cardiac pacemaker    AICD  . Sleep apnea     Past Surgical History: Past Surgical History:  Procedure Laterality Date  . AORTIC VALVE REPLACEMENT  2015   [place]  . APPLICATION OF A-CELL OF EXTREMITY Left 11/20/2017   Procedure: APPLICATION OF A-CELL;  Surgeon: Wallace Going, DO;  Location: [place];  Service: Plastics;  Laterality: Left;  . AV FISTULA PLACEMENT Left 06/30/2017   Procedure: CREATION of LEFT ARM Brachiocephalic Fistula;  Surgeon: Conrad [place], MD;  Location: [place];  Service: Vascular;  Laterality: Left;  . CARDIAC CATHETERIZATION N/A 12/02/2016   Procedure: Right Heart Cath;  Surgeon: Larey Dresser, MD;  Location: [place] CV LAB;  Service: Cardiovascular;  Laterality: N/A;  . COLONOSCOPY N/A 02/14/2017   Procedure: COLONOSCOPY;  Surgeon: Milus Banister, MD;  Location: [place];  Service: Endoscopy;  Laterality: N/A;  . COLONOSCOPY WITH PROPOFOL N/A 02/27/2018   Procedure: COLONOSCOPY WITH PROPOFOL;  Surgeon: Jerene Bears, MD;  Location: [place];  Service: Gastroenterology;  Laterality: N/A;  . CORONARY ANGIOPLASTY WITH STENT PLACEMENT  2013   in  [place]  . CORONARY ARTERY BYPASS GRAFT  2010   in [place]  . DIALYSIS/PERMA CATHETER INSERTION  06/30/2017   Procedure: INSERTION Dialysis Catheter;  Surgeon: Conrad [place], MD;  Location: [facility] OR;  Service: Vascular;;  . ESOPHAGOGASTRODUODENOSCOPY (EGD) WITH PROPOFOL N/A 02/27/2018   Procedure: ESOPHAGOGASTRODUODENOSCOPY (EGD) WITH PROPOFOL;  Surgeon: Jerene Bears, MD;  Location: [facility] ENDOSCOPY;  Service: Gastroenterology;  Laterality: N/A;  . GIVENS CAPSULE STUDY N/A 02/27/2018   Procedure: GIVENS CAPSULE STUDY;  Surgeon: Jerene Bears, MD;  Location: [place];  Service: Gastroenterology;  Laterality: N/A;  . GIVENS CAPSULE STUDY N/A 03/25/2018   Procedure: GIVENS CAPSULE STUDY;  Surgeon: Jackquline Denmark, MD;  Location: [facility] ENDOSCOPY;  Service: Endoscopy;  Laterality: N/A;  . IABP INSERTION N/A 12/11/2016  Procedure: IABP Insertion;  Surgeon: Larey Dresser, MD;  Location: [place] CV LAB;  Service: Cardiovascular;  Laterality: N/A;  . INSERTION OF IMPLANTABLE LEFT VENTRICULAR ASSIST DEVICE N/A 12/13/2016   Procedure: INSERTION OF IMPLANTABLE LEFT VENTRICULAR ASSIST DEVICE;  Surgeon: Ivin Poot, MD;  Location: [place];  Service: Open Heart Surgery;  Laterality: N/A;  HEARTMATE II  NITRIC OXIDE  . IR FLUORO GUIDE CV LINE LEFT  06/17/2017  . IR US GUIDE VASC ACCESS LEFT  06/17/2017  . MASS EXCISION Left 11/20/2017   Procedure: EXCISION OF LEFT NECK AND RIGHT LEG SKIN CANCER WITH A CELL PLACEMENT;  Surgeon: Wallace Going, DO;  Location: [place];  Service: Plastics;  Laterality: Left;  Marland Kitchen MASS EXCISION Left 11/26/2017   Procedure: EXPLORATION AND EXCISION OF LEFT NECK;  Surgeon: Wallace Going, DO;  Location: [place];  Service: Plastics;  Laterality: Left;  . RIGHT HEART CATH N/A 12/11/2016   Procedure: Right Heart Cath;  Surgeon: Larey Dresser, MD;  Location: [place] CV LAB;  Service: Cardiovascular;  Laterality: N/A;  . RIGHT HEART CATH N/A 03/27/2017   Procedure: Right Heart Cath;   Surgeon: Larey Dresser, MD;  Location: [place] CV LAB;  Service: Cardiovascular;  Laterality: N/A;  . RIGHT HEART CATH N/A 06/13/2017   Procedure: RIGHT HEART CATH;  Surgeon: Jolaine Artist, MD;  Location: [place] CV LAB;  Service: Cardiovascular;  Laterality: N/A;  . TEE WITHOUT CARDIOVERSION N/A 12/13/2016   Procedure: TRANSESOPHAGEAL ECHOCARDIOGRAM (TEE);  Surgeon: Ivin Poot, MD;  Location: [place];  Service: Open Heart Surgery;  Laterality: N/A;  . TRICUSPID VALVE REPLACEMENT N/A 12/13/2016   Procedure: TRICUSPID VALVE REPAIR WITH EDWARDS MC 3 TRICUSPID ANNULOPLASTY RING MODEL 4900 SIZE T 28;  Surgeon: Ivin Poot, MD;  Location: [place];  Service: Open Heart Surgery;  Laterality: N/A;    Family History: Family History  Problem Relation Age of Onset  . Heart failure Father   . Heart attack Father   . Healthy Mother   . Diabetes Paternal Grandfather     Social History: Social History   Socioeconomic History  . Marital status: Widowed    Spouse name: Not on file  . Number of children: 0  . Years of education: 10  . Highest education level: Not on file  Occupational History  . Not on file  Social Needs  . Financial resource strain: Not on file  . Food insecurity:    Worry: Not on file    Inability: Not on file  . Transportation needs:    Medical: Not on file    Non-medical: Not on file  Tobacco Use  . Smoking status: Never Smoker  . Smokeless tobacco: Never Used  Substance and Sexual Activity  . Alcohol use: No  . Drug use: No  . Sexual activity: Not Currently  Lifestyle  . Physical activity:    Days per week: Not on file    Minutes per session: Not on file  . Stress: Not on file  Relationships  . Social connections:    Talks on phone: Not on file    Gets together: Not on file    Attends religious service: Not on file    Active member of club or organization: Not on file    Attends meetings of clubs or organizations: Not on file     Relationship status: Not on file  Other Topics Concern  . Not on file  Social  History Narrative   Patient is a widow. Moved to [place] from [place], [place]. Currently lives with his sister Trevor [name].    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     Allergies:  No Known Allergies  Objective:    Vital Signs:   Temp:  [97.7 F (36.5 C)] 97.7 F (36.5 C) (06/27 1134) Pulse Rate:  [93] 93 (06/27 1134) Resp:  [13] 13 (06/27 1134) BP: (111)/(91) 111/91 (06/27 1134) SpO2:  [69 %] 69 % (06/27 1134) Weight:  [188 lb 11.4 oz (85.6 kg)] 188 lb 11.4 oz (85.6 kg) (06/27 1134) Last BM Date: 04/29/18 Filed Weights   04/30/18 1134  Weight: 188 lb 11.4 oz (85.6 kg)    Mean arterial Pressure 80s  Physical Exam    General:  No resp difficulty HEENT: Normal Neck: supple. JVP elevated. Carotids 2+ bilat; no bruits. No lymphadenopathy or thyromegaly appreciated. Cor: Mechanical heart sounds with LVAD hum present. Lungs: Clear Abdomen: soft, nontender, nondistended. No hepatosplenomegaly. No bruits or masses. Good bowel sounds. Driveline: C/D/I; securement device intact and driveline incorporated Extremities: no cyanosis, clubbing, rash, BLE 2+ edema. Left knee with sutures, pink around incision Neuro: alert & orientedx3, cranial nerves grossly intact. moves all 4 extremities w/o difficulty. Affect pleasant   Telemetry   Afib 70s. Personally reviewed.    EKG   No new tracings.   Labs    Basic Metabolic Panel: No results for input(s): NA, K, CL, CO2, GLUCOSE, BUN, CREATININE, CALCIUM, MG, PHOS in the last 168 hours.  Liver Function Tests: No results for input(s): AST, ALT, ALKPHOS, BILITOT, PROT, ALBUMIN in the last 168 hours. No results for input(s): LIPASE, AMYLASE in the last 168 hours. No results for input(s): AMMONIA in the last 168 hours.  CBC: No results for input(s): WBC, NEUTROABS, HGB, HCT, MCV, PLT in the last 168 hours.  Cardiac Enzymes: No results  for input(s): CKTOTAL, CKMB, CKMBINDEX, TROPONINI in the last 168 hours.  BNP: BNP (last 3 results) No results for input(s): BNP in the last 8760 hours.  ProBNP (last 3 results) No results for input(s): PROBNP in the last 8760 hours.   CBG: Recent Labs  Lab 04/30/18 1223  GLUCAP 142*    Coagulation Studies: No results for input(s): LABPROT, INR in the last 72 hours.  Other results:   Imaging    No results found.    Patient Profile:   Trevor Belinsky Murphyis a 72 y.o.malewith a history of CAD s/p CABG x 4 2010, OSA, AS with TAVR 2015, ,ESRD on HD,DM2,chronicatrial fibrillation, asthma,GI bleed,and ischemic cardiomyopathy with Medtronic ICD.S/PHMII LVAD for Destination therapy on 12/14/2015.   Assessment/Plan:    1. GI Bleed/Symptomatic Anemia: GI AVMs. Double balloon retro endoscopy at [facility], unable to identify bleeding lesion. He failed attempted IR embolization x 3 while at [facility]. 12 units PRBCs at [facility]. Received 15 uPRBC while at [facility]. IR consulted, but did not find any active bleeding on CTA 5/20. Repeat capsule study showed active bleeding in the mid ileum. Surgery was reconsulted and recommended transfer back to [facility] for possible surgery.  - Now s/p laparoscopic surgical enteroscopy with 4 clips on 6/7 at [facility] - No further bleeding. Hemoglobin 9.1 this am.  - Restart heparin and coumadin today. Discussed with PharmD.  2. Chronic Systolic Heart Failure/RV Failure: ICM. S/p HM II LVAD placed 2017 for DT. Has Medtronic ICD.  - Monitor LDH. Baseline 170-190 - VAD interrogated personally. Parameters stable. - Volume managed by HD.  -  No bb with RV failure.  - Continue sildenafil 40 mg tid.  - Restart heparin and coumadin per pharmacy. Hold off on ASA .  - INR goal 1.8-2.2  3. ESRD- M/W/F/Sat.  - HD per Renal. Will consult today.   4. Chronic A fib:  - Rate controlled.  - As above, resume heparin and coumadin  5. HTN:  - MAPs 80  6. Septic left knee -  s/p several aspirations and OR wash out x2. Aspirate cultures positive for staph epi and clostridium perfinge. Blood cultures negative at [facility].  - On Ancef through 6/28 - Consult ortho - May need ID consult.   7. DM II - SSI  8. Hx of CAD s/p CABG.  - No s/s ischemia.  - Continue statin  9. Hx of TAVR:  - Valve stable on last echo 12/2017  10. Squamous Cell CA:  - s/p excision on neck and left shin  11. Deconditioning - Consult PT/OT - Will probably need CIR.   12. Ischemic bowel 6/19 - Resolved with NGT and bowel rest - On clear liquid diet at [facility].  - Advance diet as tolerated.   I reviewed the LVAD parameters from today, and compared the results to the patient's prior recorded data.  No programming changes were made.  The LVAD is functioning within specified parameters.  The patient performs LVAD self-test daily.  LVAD interrogation was negative for any significant power changes, alarms or PI events/speed drops.  LVAD equipment check completed and is in good working order.  Back-up equipment present.   LVAD education done on emergency procedures and precautions and reviewed exit site care.  Length of Stay: [place], NP 04/30/2018, 2:35 PM  VAD Team Pager [phone] (7am - 7am) +++VAD ISSUES ONLY+++   Advanced Heart Failure Team Pager [phone] (M-F; [place])  Please contact [facility] for night-coverage after hours (4p -7a ) and weekends on [email or web address] for all non- LVAD Issues  Patient seen and examined with the above-signed Advanced Practice Provider and/or Housestaff. I personally reviewed laboratory data, imaging studies and relevant notes. I independently examined the patient and formulated the important aspects of the plan. I have edited the note to reflect any of my changes or salient points. I have personally discussed the plan with the patient and/or family.  72 y/o male with severe systolic HF s/p LVAD placement, ESRD on HD recently transferred to [facility]  for refractory GI bleed. 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 Post-surgical course complicated by possible ischemic bowel vs SBO which resolved. Also underwent I&D or septic left knee arthritis.  Returns from [place] today. Feels well. Weak but denies SOB. Has not been back on anticoagulation yet. No bleeding.  Left knee surgical site. Ok  On exam JVP 10 Cor LVAD hum Lungs CTA Ab soft NT surgical scars ok LVAD driveline looks good Ext 2+ edema RAC AVF Left knee post-surgical changes  Will start heparin/coumadin with INR goal 1.8-2.2. Keep heparin level low. No ASA. Renal aware and will need HD. Ortho contacted for ongoing care of left knee. Continue IV abx. VAD interrogated personally. Parameters stable.  Will eventually benefit from CIR. Wil consult them   Glori Bickers, MD  6:56 PM

## 2018-04-30 ENCOUNTER — Inpatient Hospital Stay (HOSPITAL_COMMUNITY)
Admission: AD | Admit: 2018-04-30 | Discharge: 2018-05-05 | DRG: 393 | Disposition: A | Discharge status: Rehabilitation Facility | Payer: Medicare Other | Source: Other Acute Inpatient Hospital | Attending: Internal Medicine | Admitting: Internal Medicine

## 2018-04-30 ENCOUNTER — Other Ambulatory Visit: Payer: Self-pay

## 2018-04-30 ENCOUNTER — Encounter (HOSPITAL_COMMUNITY): Payer: Self-pay | Admitting: Emergency Medicine

## 2018-04-30 DIAGNOSIS — R5381 Other malaise: Secondary | ICD-10-CM | POA: Diagnosis present

## 2018-04-30 DIAGNOSIS — Z833 Family history of diabetes mellitus: Secondary | ICD-10-CM | POA: Diagnosis not present

## 2018-04-30 DIAGNOSIS — E8889 Other specified metabolic disorders: Secondary | ICD-10-CM | POA: Diagnosis present

## 2018-04-30 DIAGNOSIS — Z7951 Long term (current) use of inhaled steroids: Secondary | ICD-10-CM | POA: Diagnosis not present

## 2018-04-30 DIAGNOSIS — D631 Anemia in chronic kidney disease: Secondary | ICD-10-CM | POA: Diagnosis present

## 2018-04-30 DIAGNOSIS — G4733 Obstructive sleep apnea (adult) (pediatric): Secondary | ICD-10-CM | POA: Diagnosis present

## 2018-04-30 DIAGNOSIS — Z992 Dependence on renal dialysis: Secondary | ICD-10-CM

## 2018-04-30 DIAGNOSIS — Z95810 Presence of automatic (implantable) cardiac defibrillator: Secondary | ICD-10-CM | POA: Diagnosis not present

## 2018-04-30 DIAGNOSIS — I482 Chronic atrial fibrillation: Secondary | ICD-10-CM | POA: Diagnosis present

## 2018-04-30 DIAGNOSIS — F411 Generalized anxiety disorder: Secondary | ICD-10-CM

## 2018-04-30 DIAGNOSIS — Z85828 Personal history of other malignant neoplasm of skin: Secondary | ICD-10-CM

## 2018-04-30 DIAGNOSIS — K5521 Angiodysplasia of colon with hemorrhage: Secondary | ICD-10-CM | POA: Diagnosis present

## 2018-04-30 DIAGNOSIS — E119 Type 2 diabetes mellitus without complications: Secondary | ICD-10-CM

## 2018-04-30 DIAGNOSIS — Z95811 Presence of heart assist device: Secondary | ICD-10-CM

## 2018-04-30 DIAGNOSIS — I5023 Acute on chronic systolic (congestive) heart failure: Secondary | ICD-10-CM | POA: Diagnosis present

## 2018-04-30 DIAGNOSIS — Z4789 Encounter for other orthopedic aftercare: Secondary | ICD-10-CM | POA: Diagnosis not present

## 2018-04-30 DIAGNOSIS — I5022 Chronic systolic (congestive) heart failure: Secondary | ICD-10-CM

## 2018-04-30 DIAGNOSIS — N2581 Secondary hyperparathyroidism of renal origin: Secondary | ICD-10-CM | POA: Diagnosis present

## 2018-04-30 DIAGNOSIS — Z955 Presence of coronary angioplasty implant and graft: Secondary | ICD-10-CM

## 2018-04-30 DIAGNOSIS — I25810 Atherosclerosis of coronary artery bypass graft(s) without angina pectoris: Secondary | ICD-10-CM | POA: Diagnosis not present

## 2018-04-30 DIAGNOSIS — Z951 Presence of aortocoronary bypass graft: Secondary | ICD-10-CM | POA: Diagnosis not present

## 2018-04-30 DIAGNOSIS — Z794 Long term (current) use of insulin: Secondary | ICD-10-CM | POA: Diagnosis not present

## 2018-04-30 DIAGNOSIS — I255 Ischemic cardiomyopathy: Secondary | ICD-10-CM | POA: Diagnosis present

## 2018-04-30 DIAGNOSIS — K55059 Acute (reversible) ischemia of intestine, part and extent unspecified: Secondary | ICD-10-CM | POA: Diagnosis present

## 2018-04-30 DIAGNOSIS — M009 Pyogenic arthritis, unspecified: Secondary | ICD-10-CM

## 2018-04-30 DIAGNOSIS — E11649 Type 2 diabetes mellitus with hypoglycemia without coma: Secondary | ICD-10-CM | POA: Diagnosis present

## 2018-04-30 DIAGNOSIS — Z952 Presence of prosthetic heart valve: Secondary | ICD-10-CM | POA: Diagnosis not present

## 2018-04-30 DIAGNOSIS — E1122 Type 2 diabetes mellitus with diabetic chronic kidney disease: Secondary | ICD-10-CM | POA: Diagnosis present

## 2018-04-30 DIAGNOSIS — D649 Anemia, unspecified: Secondary | ICD-10-CM | POA: Diagnosis not present

## 2018-04-30 DIAGNOSIS — N186 End stage renal disease: Secondary | ICD-10-CM | POA: Diagnosis present

## 2018-04-30 DIAGNOSIS — G4700 Insomnia, unspecified: Secondary | ICD-10-CM | POA: Diagnosis present

## 2018-04-30 DIAGNOSIS — K274 Chronic or unspecified peptic ulcer, site unspecified, with hemorrhage: Secondary | ICD-10-CM | POA: Diagnosis not present

## 2018-04-30 DIAGNOSIS — Z950 Presence of cardiac pacemaker: Secondary | ICD-10-CM

## 2018-04-30 DIAGNOSIS — I2510 Atherosclerotic heart disease of native coronary artery without angina pectoris: Secondary | ICD-10-CM | POA: Diagnosis present

## 2018-04-30 DIAGNOSIS — Z79899 Other long term (current) drug therapy: Secondary | ICD-10-CM

## 2018-04-30 DIAGNOSIS — I132 Hypertensive heart and chronic kidney disease with heart failure and with stage 5 chronic kidney disease, or end stage renal disease: Secondary | ICD-10-CM | POA: Diagnosis present

## 2018-04-30 DIAGNOSIS — J45909 Unspecified asthma, uncomplicated: Secondary | ICD-10-CM | POA: Diagnosis present

## 2018-04-30 DIAGNOSIS — Z9889 Other specified postprocedural states: Secondary | ICD-10-CM | POA: Diagnosis not present

## 2018-04-30 DIAGNOSIS — I953 Hypotension of hemodialysis: Secondary | ICD-10-CM | POA: Diagnosis not present

## 2018-04-30 DIAGNOSIS — K284 Chronic or unspecified gastrojejunal ulcer with hemorrhage: Secondary | ICD-10-CM | POA: Diagnosis not present

## 2018-04-30 DIAGNOSIS — D62 Acute posthemorrhagic anemia: Secondary | ICD-10-CM | POA: Diagnosis present

## 2018-04-30 DIAGNOSIS — K922 Gastrointestinal hemorrhage, unspecified: Secondary | ICD-10-CM | POA: Diagnosis present

## 2018-04-30 DIAGNOSIS — F419 Anxiety disorder, unspecified: Secondary | ICD-10-CM | POA: Diagnosis present

## 2018-04-30 DIAGNOSIS — G479 Sleep disorder, unspecified: Secondary | ICD-10-CM | POA: Diagnosis not present

## 2018-04-30 DIAGNOSIS — D72829 Elevated white blood cell count, unspecified: Secondary | ICD-10-CM | POA: Diagnosis not present

## 2018-04-30 DIAGNOSIS — I9589 Other hypotension: Secondary | ICD-10-CM | POA: Diagnosis not present

## 2018-04-30 DIAGNOSIS — Z8249 Family history of ischemic heart disease and other diseases of the circulatory system: Secondary | ICD-10-CM | POA: Diagnosis not present

## 2018-04-30 DIAGNOSIS — Z48815 Encounter for surgical aftercare following surgery on the digestive system: Secondary | ICD-10-CM | POA: Diagnosis present

## 2018-04-30 DIAGNOSIS — R791 Abnormal coagulation profile: Secondary | ICD-10-CM | POA: Diagnosis not present

## 2018-04-30 DIAGNOSIS — M25562 Pain in left knee: Secondary | ICD-10-CM | POA: Diagnosis present

## 2018-04-30 DIAGNOSIS — Z8719 Personal history of other diseases of the digestive system: Secondary | ICD-10-CM | POA: Diagnosis not present

## 2018-04-30 LAB — PROTIME-INR
INR: 1.36
Prothrombin Time: 16.7 seconds — ABNORMAL HIGH (ref 11.4–15.2)

## 2018-04-30 LAB — CBC
HEMATOCRIT: 32.8 % — AB (ref 39.0–52.0)
HEMOGLOBIN: 9.7 g/dL — AB (ref 13.0–17.0)
MCH: 28.5 pg (ref 26.0–34.0)
MCHC: 29.6 g/dL — AB (ref 30.0–36.0)
MCV: 96.5 fL (ref 78.0–100.0)
Platelets: 181 10*3/uL (ref 150–400)
RBC: 3.4 MIL/uL — ABNORMAL LOW (ref 4.22–5.81)
RDW: 19.7 % — ABNORMAL HIGH (ref 11.5–15.5)
WBC: 16.1 10*3/uL — ABNORMAL HIGH (ref 4.0–10.5)

## 2018-04-30 LAB — GLUCOSE, CAPILLARY
GLUCOSE-CAPILLARY: 125 mg/dL — AB (ref 70–99)
Glucose-Capillary: 142 mg/dL — ABNORMAL HIGH (ref 70–99)
Glucose-Capillary: 90 mg/dL (ref 70–99)

## 2018-04-30 LAB — MRSA PCR SCREENING: MRSA by PCR: NEGATIVE

## 2018-04-30 MED ORDER — ALBUTEROL SULFATE (2.5 MG/3ML) 0.083% IN NEBU
2.5000 mg | INHALATION_SOLUTION | RESPIRATORY_TRACT | Status: DC | PRN
Start: 2018-04-30 — End: 2018-05-05

## 2018-04-30 MED ORDER — SENNOSIDES-DOCUSATE SODIUM 8.6-50 MG PO TABS
1.0000 | ORAL_TABLET | Freq: Two times a day (BID) | ORAL | Status: DC
  Administered 2018-04-30 – 2018-05-05 (×5): 1 via ORAL
  Filled 2018-04-30 (×8): qty 1

## 2018-04-30 MED ORDER — ACETAMINOPHEN 325 MG PO TABS
650.0000 mg | ORAL_TABLET | ORAL | Status: DC | PRN
  Administered 2018-05-02: 650 mg via ORAL
  Filled 2018-04-30: qty 2

## 2018-04-30 MED ORDER — INSULIN ASPART 100 UNIT/ML ~~LOC~~ SOLN
0.0000 [IU] | Freq: Three times a day (TID) | SUBCUTANEOUS | Status: DC
  Administered 2018-04-30: 2 [IU] via SUBCUTANEOUS
  Administered 2018-05-01 – 2018-05-02 (×2): 3 [IU] via SUBCUTANEOUS
  Administered 2018-05-03: 2 [IU] via SUBCUTANEOUS
  Administered 2018-05-03 – 2018-05-04 (×2): 3 [IU] via SUBCUTANEOUS
  Administered 2018-05-05: 2 [IU] via SUBCUTANEOUS

## 2018-04-30 MED ORDER — INSULIN ASPART 100 UNIT/ML ~~LOC~~ SOLN: 0.0000 [IU] | Freq: Every day | SUBCUTANEOUS | Status: DC

## 2018-04-30 MED ORDER — BUSPIRONE HCL 5 MG PO TABS
5.0000 mg | ORAL_TABLET | Freq: Two times a day (BID) | ORAL | Status: DC
  Administered 2018-04-30 – 2018-05-05 (×10): 5 mg via ORAL
  Filled 2018-04-30 (×11): qty 1

## 2018-04-30 MED ORDER — PANTOPRAZOLE SODIUM 40 MG IV SOLR
40.0000 mg | Freq: Two times a day (BID) | INTRAVENOUS | Status: DC
  Administered 2018-04-30 – 2018-05-05 (×10): 40 mg via INTRAVENOUS
  Filled 2018-04-30 (×12): qty 40

## 2018-04-30 MED ORDER — HEPARIN (PORCINE) IN NACL 100-0.45 UNIT/ML-% IJ SOLN
1100.0000 [IU]/h | INTRAMUSCULAR | Status: DC
  Administered 2018-04-30: 950 [IU]/h via INTRAVENOUS
  Administered 2018-05-01 – 2018-05-02 (×2): 1100 [IU]/h via INTRAVENOUS
  Filled 2018-04-30 (×3): qty 250

## 2018-04-30 MED ORDER — CALCITRIOL 0.25 MCG PO CAPS
0.5000 ug | ORAL_CAPSULE | ORAL | Status: DC
  Administered 2018-05-01 – 2018-05-04 (×2): 0.5 ug via ORAL
  Filled 2018-04-30 (×2): qty 2

## 2018-04-30 MED ORDER — LEVOTHYROXINE SODIUM 25 MCG PO TABS
25.0000 ug | ORAL_TABLET | Freq: Every day | ORAL | Status: DC
  Administered 2018-05-01 – 2018-05-05 (×5): 25 ug via ORAL
  Filled 2018-04-30 (×5): qty 1

## 2018-04-30 MED ORDER — BUDESONIDE 0.25 MG/2ML IN SUSP
0.2500 mg | Freq: Two times a day (BID) | RESPIRATORY_TRACT | Status: DC
  Administered 2018-04-30 – 2018-05-05 (×10): 0.25 mg via RESPIRATORY_TRACT
  Filled 2018-04-30 (×10): qty 2

## 2018-04-30 MED ORDER — ATORVASTATIN CALCIUM 40 MG PO TABS
40.0000 mg | ORAL_TABLET | Freq: Every day | ORAL | Status: DC
  Administered 2018-05-01 – 2018-05-05 (×5): 40 mg via ORAL
  Filled 2018-04-30 (×5): qty 1

## 2018-04-30 MED ORDER — CEFAZOLIN SODIUM-DEXTROSE 1-4 GM/50ML-% IV SOLN
1.0000 g | INTRAVENOUS | Status: AC
  Administered 2018-04-30 – 2018-05-04 (×5): 1 g via INTRAVENOUS
  Filled 2018-04-30 (×5): qty 50

## 2018-04-30 MED ORDER — INSULIN ASPART 100 UNIT/ML ~~LOC~~ SOLN
3.0000 [IU] | Freq: Three times a day (TID) | SUBCUTANEOUS | Status: DC
  Administered 2018-05-01 – 2018-05-05 (×7): 3 [IU] via SUBCUTANEOUS

## 2018-04-30 MED ORDER — CHLORHEXIDINE GLUCONATE CLOTH 2 % EX PADS
6.0000 | MEDICATED_PAD | Freq: Every day | CUTANEOUS | Status: DC
  Administered 2018-05-01: 6 via TOPICAL

## 2018-04-30 MED ORDER — WARFARIN SODIUM 7.5 MG PO TABS
7.5000 mg | ORAL_TABLET | Freq: Once | ORAL | Status: AC
  Administered 2018-04-30: 7.5 mg via ORAL
  Filled 2018-04-30: qty 1

## 2018-04-30 MED ORDER — ONDANSETRON HCL 4 MG/2ML IJ SOLN: 4.0000 mg | Freq: Four times a day (QID) | INTRAMUSCULAR | Status: DC | PRN

## 2018-04-30 MED ORDER — WARFARIN - PHARMACIST DOSING INPATIENT
Freq: Every day | Status: DC
  Administered 2018-05-01: 18:00:00

## 2018-04-30 MED ORDER — BENZONATATE 100 MG PO CAPS
100.0000 mg | ORAL_CAPSULE | Freq: Three times a day (TID) | ORAL | Status: DC | PRN
  Administered 2018-05-03: 100 mg via ORAL
  Filled 2018-04-30: qty 1

## 2018-04-30 MED ORDER — OXYCODONE HCL 5 MG PO TABS
5.0000 mg | ORAL_TABLET | ORAL | Status: DC | PRN
  Administered 2018-04-30 – 2018-05-05 (×18): 5 mg via ORAL
  Filled 2018-04-30 (×18): qty 1

## 2018-04-30 NOTE — Progress Notes (Addendum)
ANTICOAGULATION CONSULT NOTE - Initial Consult  Pharmacy Consult for heparin and warfarin Indication: LVAD  No Known Allergies  Patient Measurements: Height: 5\' 5"  (165.1 cm) Weight: 188 lb 11.4 oz (85.6 kg) IBW/kg (Calculated) : 61.5 Heparin Dosing Weight: 79.5 kg  Vital Signs: Temp: 97.6 F (36.4 C) (06/27 1633) Temp Source: Oral (06/27 1633) BP: 110/86 (06/27 1633) Pulse Rate: 118 (06/27 1633)  Labs: Recent Labs    04/30/18 1651  HGB 9.7*  HCT 32.8*  PLT 181  LABPROT 16.7*  INR 1.36    CrCl cannot be calculated (Patient's most recent lab result is older than the maximum 21 days allowed.).   Medical History: Past Medical History:  Diagnosis Date  . AICD (automatic cardioverter/defibrillator) present   . Asthma   . AVM (arteriovenous malformation) of colon 07/07/2017  . CHF (congestive heart failure) ([place])   . Diabetes ([place])   . ESRF (end stage renal failure) ([place]) 07/07/2017   pt receiving hemodialysis Monday- Wednesday-friday, sometimes Saturday  . Kidney disease   . LVAD (left ventricular assist device) present ([place]) 12/2016  . Permanent atrial fibrillation ([place]) 07/07/2017  . Presence of permanent cardiac pacemaker    AICD  . Sleep apnea     Medications:  Scheduled:  . [START ON 05/01/2018] atorvastatin  40 mg Oral q1800  . budesonide  0.25 mg Nebulization BID  . busPIRone  5 mg Oral BID  . [START ON 05/01/2018] calcitRIOL  0.5 mcg Oral Q M,W,F  . [START ON 05/01/2018] Chlorhexidine Gluconate Cloth  6 each Topical Q0600  . insulin aspart  0-15 Units Subcutaneous TID WC  . insulin aspart  0-5 Units Subcutaneous QHS  . insulin aspart  3 Units Subcutaneous TID WC  . [START ON 05/01/2018] levothyroxine  25 mcg Oral QAC breakfast  . pantoprazole (PROTONIX) IV  40 mg Intravenous Q12H  . senna-docusate  1 tablet Oral BID  . warfarin  7.5 mg Oral ONCE-1800  . Warfarin - Pharmacist Dosing Inpatient   Does not apply q1800    Assessment: 31 yom who underwent  HM2 LVAD implantation on 12/14/2015. Has undergoing extensive GI workup to evaluate bleeding. Was stopped on anticoagulants as of admission on 03/09/2018. Last outpatient regimen was (as of 02/17/2018): 7.5 mg daily except 10 mg on Tues/Thurs for goal 2-2.5. Goal INR was decreased to 1.8-2.3 after admission on 02/28/2018. Now s/p laparoscopic surgical enteroscopy with 4 clips on 04/10/2018 - okay to resume heparin and warfarin.   Hgb today is 9.7, plt 181. INR is 1.36. No s/sx of bleeding noted at this time.   Goal of Therapy:  Heparin level: 0.3-0.5 INR goal: 1.8-2.2 Monitor platelets by anticoagulation protocol: Yes   Plan:  Will order warfarin 7.5 mg once  Start heparin infusion at 950 units/hr Check anti-Xa level in 8 hours and daily while on heparin Continue to monitor H&H and platelets  Doylene Canard, PharmD Clinical Pharmacist  Pager: [phone] Phone: [phone] 04/30/2018,6:43 PM

## 2018-04-30 NOTE — Consult Note (Addendum)
[facility] Renal Consultation Note    Indication for Consultation:  Management of ESRD/hemodialysis, anemia, hypertension/volume, and secondary hyperparathyroidism. PCP:  HPI: Trevor [name] is a 72 y.o. male with ESRD, CAD (s/p 4v CABG 2010), ICM (s/p LVAD 12/2015), OSA, AS (s/p TAVR 2015), a-fib (not-anticoagulated), T2DM, asthma, and recurrent GI bleeding episodes who was transferred back to [facility] from [place].  Recent events (summarized): - 4/25-03/05/18: [facility] admit with GI bleed. S/p capsule endo and colonoscopy, 5U PRBCs. - 03/09/18: Admitted to [facility] with recurrent GI bleeding, warfarin stopped. - 03/10/18: Transferred to [place] for deep enteroscopy and double retro balloon scope and IR embolization. S/p 12U PRBCs there.  - 03/20/18: Back to [facility], but bleeding continued - given another 15U PRBCs. Repeat capsule study + mid-ileum bleed. Developed L knee pain 03/26/18. S/p aspiration and started on steroid taper. 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 He developed SBO and acute ischemic bowel, both of which have improved. Hospitalization also complicated by septic arthritis L knee - requiring I&D 6/5, OR washout 6/7 and 6/15 (Cx grew Staph Epi and Clostridium). On Cefazolin through 6/28. - 6/27: Transferred back to [facility].  Pt reports still with bleeding episodes. Hgb ordered for today. Otherwise, denies CP, dyspnea, N/V, fever, chills.  Last dialysis was Wednesday at [facility] - continues to follow [facility] schedule. L AVF appears open, he denies recent issues. + edema, chronic issue.  Past Medical History:  Diagnosis Date  . AICD (automatic cardioverter/defibrillator) present   . Asthma   . AVM (arteriovenous malformation) of colon 07/07/2017  . CHF (congestive heart failure) ([place])   . Diabetes ([place])   . ESRF (end stage renal failure) ([place]) 07/07/2017   pt receiving hemodialysis Monday- Wednesday-friday, sometimes  Saturday  . Kidney disease   . LVAD (left ventricular assist device) present ([place]) 12/2016  . Permanent atrial fibrillation ([place]) 07/07/2017  . Presence of permanent cardiac pacemaker    AICD  . Sleep apnea    Past Surgical History:  Procedure Laterality Date  . AORTIC VALVE REPLACEMENT  2015   [place]  . APPLICATION OF A-CELL OF EXTREMITY Left 11/20/2017   Procedure: APPLICATION OF A-CELL;  Surgeon: Wallace Going, DO;  Location: [place];  Service: Plastics;  Laterality: Left;  . AV FISTULA PLACEMENT Left 06/30/2017   Procedure: CREATION of LEFT ARM Brachiocephalic Fistula;  Surgeon: Conrad Humboldt, MD;  Location: [place];  Service: Vascular;  Laterality: Left;  . CARDIAC CATHETERIZATION N/A 12/02/2016   Procedure: Right Heart Cath;  Surgeon: Larey Dresser, MD;  Location: [place] CV LAB;  Service: Cardiovascular;  Laterality: N/A;  . COLONOSCOPY N/A 02/14/2017   Procedure: COLONOSCOPY;  Surgeon: Milus Banister, MD;  Location: [place];  Service: Endoscopy;  Laterality: N/A;  . COLONOSCOPY WITH PROPOFOL N/A 02/27/2018   Procedure: COLONOSCOPY WITH PROPOFOL;  Surgeon: Jerene Bears, MD;  Location: [place];  Service: Gastroenterology;  Laterality: N/A;  . CORONARY ANGIOPLASTY WITH STENT PLACEMENT  2013   in [place]  . CORONARY ARTERY BYPASS GRAFT  2010   in [place]  . DIALYSIS/PERMA CATHETER INSERTION  06/30/2017   Procedure: INSERTION Dialysis Catheter;  Surgeon: Conrad [place], MD;  Location: [facility] OR;  Service: Vascular;;  . ESOPHAGOGASTRODUODENOSCOPY (EGD) WITH PROPOFOL N/A 02/27/2018   Procedure: ESOPHAGOGASTRODUODENOSCOPY (EGD) WITH PROPOFOL;  Surgeon: Jerene Bears, MD;  Location: [facility] ENDOSCOPY;  Service: Gastroenterology;  Laterality: N/A;  . GIVENS  CAPSULE STUDY N/A 02/27/2018   Procedure: GIVENS CAPSULE STUDY;  Surgeon: Jerene Bears, MD;  Location: [place];  Service: Gastroenterology;  Laterality: N/A;  . GIVENS CAPSULE STUDY N/A 03/25/2018   Procedure: GIVENS CAPSULE  STUDY;  Surgeon: Jackquline Denmark, MD;  Location: [facility] ENDOSCOPY;  Service: Endoscopy;  Laterality: N/A;  . IABP INSERTION N/A 12/11/2016   Procedure: IABP Insertion;  Surgeon: Larey Dresser, MD;  Location: [place] CV LAB;  Service: Cardiovascular;  Laterality: N/A;  . INSERTION OF IMPLANTABLE LEFT VENTRICULAR ASSIST DEVICE N/A 12/13/2016   Procedure: INSERTION OF IMPLANTABLE LEFT VENTRICULAR ASSIST DEVICE;  Surgeon: Ivin Poot, MD;  Location: [place];  Service: Open Heart Surgery;  Laterality: N/A;  HEARTMATE II  NITRIC OXIDE  . IR FLUORO GUIDE CV LINE LEFT  06/17/2017  . IR US GUIDE VASC ACCESS LEFT  06/17/2017  . MASS EXCISION Left 11/20/2017   Procedure: EXCISION OF LEFT NECK AND RIGHT LEG SKIN CANCER WITH A CELL PLACEMENT;  Surgeon: Wallace Going, DO;  Location: [place];  Service: Plastics;  Laterality: Left;  Marland Kitchen MASS EXCISION Left 11/26/2017   Procedure: EXPLORATION AND EXCISION OF LEFT NECK;  Surgeon: Wallace Going, DO;  Location: [place];  Service: Plastics;  Laterality: Left;  . RIGHT HEART CATH N/A 12/11/2016   Procedure: Right Heart Cath;  Surgeon: Larey Dresser, MD;  Location: [place] CV LAB;  Service: Cardiovascular;  Laterality: N/A;  . RIGHT HEART CATH N/A 03/27/2017   Procedure: Right Heart Cath;  Surgeon: Larey Dresser, MD;  Location: [place] CV LAB;  Service: Cardiovascular;  Laterality: N/A;  . RIGHT HEART CATH N/A 06/13/2017   Procedure: RIGHT HEART CATH;  Surgeon: Jolaine Artist, MD;  Location: [place] CV LAB;  Service: Cardiovascular;  Laterality: N/A;  . TEE WITHOUT CARDIOVERSION N/A 12/13/2016   Procedure: TRANSESOPHAGEAL ECHOCARDIOGRAM (TEE);  Surgeon: Ivin Poot, MD;  Location: [place];  Service: Open Heart Surgery;  Laterality: N/A;  . TRICUSPID VALVE REPLACEMENT N/A 12/13/2016   Procedure: TRICUSPID VALVE REPAIR WITH EDWARDS MC 3 TRICUSPID ANNULOPLASTY RING MODEL 4900 SIZE T 28;  Surgeon: Ivin Poot, MD;  Location: [place];  Service: Open  Heart Surgery;  Laterality: N/A;   Family History  Problem Relation Age of Onset  . Heart failure Father   . Heart attack Father   . Healthy Mother   . Diabetes Paternal Grandfather    Social History:  reports that he has never smoked. He has never used smokeless tobacco. He reports that he does not drink alcohol or use drugs.  ROS: As per HPI otherwise negative.  Physical Exam: Vitals:   04/30/18 1134  BP: (!) 111/91  Pulse: 93  Resp: 13  Temp: 97.7 F (36.5 C)  TempSrc: Oral  SpO2: (!) 69%  Weight: 85.6 kg (188 lb 11.4 oz)  Height: 5\' 5"  (1.651 m)     General: Well developed, well nourished, in no acute distress. Head: Normocephalic, atraumatic, sclera non-icteric, mucus membranes are moist. Neck: Supple without lymphadenopathy/masses. JVD not elevated. Lungs: Clear bilaterally to auscultation without wheezes, rales, or rhonchi. Breathing is unlabored. Heart: RRR with normal S1, S2. No murmurs, rubs, or gallops appreciated. Abdomen: Soft, non-tender, non-distended with normoactive bowel sounds.  Musculoskeletal:  Strength and tone appear normal for age. Lower extremities: 2+ LE and UE edema Neuro: Alert and oriented X 3. Moves all extremities spontaneously. Psych:  Responds to questions appropriately with a normal affect. Dialysis Access:  L AVF + bruit  No Known Allergies Prior to Admission medications   Medication Sig Start Date End Date Taking? Authorizing Provider  acetaminophen (TYLENOL) 325 MG tablet Take 2 tablets (650 mg total) by mouth every 4 (four) hours as needed for headache or mild pain. 03/10/18   Shirley Friar, PA-C  albuterol (PROVENTIL HFA;VENTOLIN HFA) 108 (90 Base) MCG/ACT inhaler Inhale 2 puffs into the lungs every 4 (four) hours as needed for wheezing or shortness of breath. 02/10/18   Larey Dresser, MD  atorvastatin (LIPITOR) 40 MG tablet TAKE 1 TABLET(40 MG) BY MOUTH DAILY Patient taking differently: Take 40 mg by mouth daily at 6 PM.   09/02/17   Larey Dresser, MD  busPIRone (BUSPAR) 5 MG tablet Take 1 tablet (5 mg total) by mouth 2 (two) times daily. 01/22/18   Bensimhon, Shaune Pascal, MD  calcitRIOL (ROCALTROL) 0.5 MCG capsule Take 1 capsule (0.5 mcg total) by mouth every Monday, Wednesday, and Friday with hemodialysis. 03/11/18   Shirley Friar, PA-C  danazol (DANOCRINE) 100 MG capsule Take 1 capsule (100 mg total) by mouth 2 (two) times daily. 03/05/18   Larey Dresser, MD  Darbepoetin Alfa (ARANESP) 200 MCG/0.4ML SOSY injection Inject 0.4 mLs (200 mcg total) into the vein every Monday with hemodialysis. 04/06/18   Clegg, Amy D, NP  diphenhydrAMINE (BENADRYL) 25 mg capsule Take 1 capsule (25 mg total) by mouth as needed for allergies (30 min prior to blood transfusion). 04/01/18   Clegg, Amy D, NP  fluticasone (FLOVENT HFA) 110 MCG/ACT inhaler Inhale 2 puffs into the lungs 2 (two) times daily. 02/10/18   Larey Dresser, MD  gabapentin (NEURONTIN) 600 MG tablet Take 0.5 tablets (300 mg total) by mouth 2 (two) times daily. 07/23/17   Larey Dresser, MD  guaiFENesin (ROBITUSSIN) 100 MG/5ML SOLN Take 5 mLs (100 mg total) by mouth every 4 (four) hours as needed for cough or to loosen phlegm. 04/01/18   Clegg, Amy D, NP  heparin 1000 unit/mL SOLN injection 1 mL (1,000 Units total) by Dialysis route as needed (in dialysis). 04/01/18   Clegg, Amy D, NP  insulin glargine (LANTUS) 100 UNIT/ML injection Inject 0.08 mLs (8 Units total) into the skin at bedtime. 04/01/18   Clegg, Amy D, NP  levothyroxine (SYNTHROID, LEVOTHROID) 25 MCG tablet Take 1 tablet (25 mcg total) by mouth daily before breakfast. 10/02/17   Larey Dresser, MD  magnesium oxide (MAG-OX) 400 MG tablet Take 400 mg by mouth daily.    [provider]  metoCLOPramide (REGLAN) 10 MG/10ML SOLN Take 5 mLs (5 mg total) by mouth 4 (four) times daily -  before meals and at bedtime. 04/01/18   Clegg, Amy D, NP  mometasone-formoterol (DULERA) 200-5 MCG/ACT AERO Inhale 2  puffs into the lungs 2 (two) times daily. 04/01/18   Clegg, Amy D, NP  multivitamin (RENA-VIT) TABS tablet Take 1 tablet by mouth at bedtime. 09/02/17   Larey Dresser, MD  octreotide 500 mcg in sodium chloride 0.9 % 250 mL Inject 50 mcg/hr into the vein continuous. 04/01/18   Clegg, Amy D, NP  ondansetron (ZOFRAN) 4 MG/2ML SOLN injection Inject 2 mLs (4 mg total) into the vein every 6 (six) hours as needed for nausea. 04/01/18   Clegg, Amy D, NP  pantoprazole (PROTONIX) 40 MG tablet Take 1 tablet (40 mg total) by mouth daily. 08/11/17   Clegg, Amy D, NP  predniSONE (DELTASONE) 20 MG tablet Take 3 tablets (60  mg total) by mouth daily with breakfast. 04/02/18   Clegg, Amy D, NP  sildenafil (REVATIO) 20 MG tablet Take 2 tablets (40 mg total) 3 (three) times daily by mouth. Patient taking differently: Take 40 mg by mouth See admin instructions. Take two tablets (40 mg) by mouth three times a day- midday, afternoon, and bedtime 09/11/17   Larey Dresser, MD  traMADol (ULTRAM) 50 MG tablet Take 1 tablet (50 mg total) by mouth every 12 (twelve) hours as needed. TAKE 1 TABLET BY MOUTH EVERY 6 HOURS AS NEEDED FOR MODERATE PAIN Patient taking differently: Take 50 mg by mouth every 6 (six) hours as needed for moderate pain.  03/05/18   Clegg, Amy D, NP  traZODone (DESYREL) 100 MG tablet TAKE 1 TABLET BY MOUTH EVERY NIGHT AT BEDTIME AS NEEDED FOR SLEEP Patient taking differently: TAKE 1 TABLET BY MOUTH EVERY NIGHT AT BEDTIME 02/16/18   Larey Dresser, MD   Current Facility-Administered Medications  Medication Dose Route Frequency Provider Last Rate Last Dose  . acetaminophen (TYLENOL) tablet 650 mg  650 mg Oral Q4H PRN Georgiana Shore, NP      . albuterol (PROVENTIL) (2.5 MG/3ML) 0.083% nebulizer solution 2.5 mg  2.5 mg Nebulization Q4H PRN Georgiana Shore, NP      . Derrill Memo ON 05/01/2018] atorvastatin (LIPITOR) tablet 40 mg  40 mg Oral q1800 Georgiana Shore, NP      . benzonatate (TESSALON) capsule 100 mg  100 mg  Oral TID PRN Georgiana Shore, NP      . budesonide (PULMICORT) nebulizer solution 0.25 mg  0.25 mg Nebulization BID Georgiana Shore, NP      . busPIRone (BUSPAR) tablet 5 mg  5 mg Oral BID Georgiana Shore, NP      . Derrill Memo ON 05/01/2018] calcitRIOL (ROCALTROL) capsule 0.5 mcg  0.5 mcg Oral Q M,W,F Georgiana Shore, NP      . ceFAZolin (ANCEF) IVPB 1 g/50 mL premix  1 g Intravenous Q24H Georgiana Shore, NP      . insulin aspart (novoLOG) injection 0-15 Units  0-15 Units Subcutaneous TID WC Georgiana Shore, NP   2 Units at 04/30/18 1308  . insulin aspart (novoLOG) injection 0-5 Units  0-5 Units Subcutaneous QHS Georgiana Shore, NP      . insulin aspart (novoLOG) injection 3 Units  3 Units Subcutaneous TID WC Georgiana Shore, NP      . Derrill Memo ON 05/01/2018] levothyroxine (SYNTHROID, LEVOTHROID) tablet 25 mcg  25 mcg Oral QAC breakfast Georgiana Shore, NP      . ondansetron [facility]) injection 4 mg  4 mg Intravenous Q6H PRN Georgiana Shore, NP      . oxyCODONE (Oxy IR/ROXICODONE) immediate release tablet 5 mg  5 mg Oral Q4H PRN Georgiana Shore, NP      . pantoprazole (PROTONIX) injection 40 mg  40 mg Intravenous Q12H Georgiana Shore, NP      . senna-docusate (Senokot-S) tablet 1 tablet  1 tablet Oral BID Georgiana Shore, NP       Labs: All pending.  CBG: Recent Labs  Lab 04/30/18 1223  GLUCAP 142*   Dialysis Orders:  [facility] at [facility] - has not been there since early May 2019 4:30hr, 400/800, EDW 83kg (may have changed), 2K/2Ca, AVF, no heparin  Assessment/Plan: 1.  Ongoing lower GI bleeding: Per GI and primary team. Hgb pending. 2.  S/p L knee washout - septic arthritis:  Looks like finishing Cefazolin this week. Will need sutures removed soon.  3.  ESRD: Will continue usual HD schedule ([facility]a) - next tomorrow. 3L UF goal. + edema. Keep MAP > 60 on dialysis for LVAD pts.  4.  Hypertension/volume: BP ok, + edema. UF as tolerated. 5.  Anemia: Hgb pending - see above. Ongoing lower GI bleeding s/p  multiple procedures. 6.  Metabolic bone disease: Labs pending. 7.  Ischemic CM (+ LVAD)/CAD: Per heart failure team. 8.  T2DM 9.  OSA 10.  Hx TAVR  Veneta Penton, PA-C 04/30/2018, 3:01 PM  [facility] Pager: [phone]  Pt seen, examined and agree w A/P as above.  Kelly Splinter MD [employer] pager [phone]   04/30/2018, 3:59 PM

## 2018-05-01 DIAGNOSIS — R5381 Other malaise: Secondary | ICD-10-CM

## 2018-05-01 LAB — GLUCOSE, CAPILLARY
GLUCOSE-CAPILLARY: 117 mg/dL — AB (ref 70–99)
GLUCOSE-CAPILLARY: 96 mg/dL (ref 70–99)
Glucose-Capillary: 159 mg/dL — ABNORMAL HIGH (ref 70–99)
Glucose-Capillary: 58 mg/dL — ABNORMAL LOW (ref 70–99)

## 2018-05-01 LAB — BASIC METABOLIC PANEL
ANION GAP: 10 (ref 5–15)
BUN: 19 mg/dL (ref 8–23)
CO2: 26 mmol/L (ref 22–32)
CREATININE: 6.09 mg/dL — AB (ref 0.61–1.24)
Calcium: 7.7 mg/dL — ABNORMAL LOW (ref 8.9–10.3)
Chloride: 100 mmol/L (ref 98–111)
GFR, EST AFRICAN AMERICAN: 10 mL/min — AB (ref >60)
GFR, EST NON AFRICAN AMERICAN: 8 mL/min — AB (ref >60)
Glucose, Bld: 144 mg/dL — ABNORMAL HIGH (ref 70–99)
Potassium: 3.7 mmol/L (ref 3.5–5.1)
SODIUM: 136 mmol/L (ref 135–145)

## 2018-05-01 LAB — MAGNESIUM: MAGNESIUM: 1.5 mg/dL — AB (ref 1.7–2.4)

## 2018-05-01 LAB — CBC
HEMATOCRIT: 31.6 % — AB (ref 39.0–52.0)
Hemoglobin: 9.5 g/dL — ABNORMAL LOW (ref 13.0–17.0)
MCH: 28.9 pg (ref 26.0–34.0)
MCHC: 30.1 g/dL (ref 30.0–36.0)
MCV: 96 fL (ref 78.0–100.0)
PLATELETS: 147 10*3/uL — AB (ref 150–400)
RBC: 3.29 MIL/uL — ABNORMAL LOW (ref 4.22–5.81)
RDW: 19.9 % — ABNORMAL HIGH (ref 11.5–15.5)
WBC: 14.2 10*3/uL — AB (ref 4.0–10.5)

## 2018-05-01 LAB — PROTIME-INR
INR: 1.54
PROTHROMBIN TIME: 18.3 s — AB (ref 11.4–15.2)

## 2018-05-01 LAB — HEPARIN LEVEL (UNFRACTIONATED)
HEPARIN UNFRACTIONATED: 0.12 [IU]/mL — AB (ref 0.30–0.70)
Heparin Unfractionated: 0.43 IU/mL (ref 0.30–0.70)

## 2018-05-01 LAB — LACTATE DEHYDROGENASE: LDH: 297 U/L — ABNORMAL HIGH (ref 98–192)

## 2018-05-01 MED ORDER — LIDOCAINE HCL (PF) 1 % IJ SOLN: 5.0000 mL | INTRAMUSCULAR | Status: DC | PRN

## 2018-05-01 MED ORDER — CHLORHEXIDINE GLUCONATE CLOTH 2 % EX PADS: 6.0000 | MEDICATED_PAD | Freq: Every day | CUTANEOUS | Status: DC

## 2018-05-01 MED ORDER — WARFARIN SODIUM 5 MG PO TABS
5.0000 mg | ORAL_TABLET | Freq: Once | ORAL | Status: AC
  Administered 2018-05-01: 5 mg via ORAL
  Filled 2018-05-01: qty 1

## 2018-05-01 MED ORDER — SODIUM CHLORIDE 0.9 % IV SOLN: 100.0000 mL | INTRAVENOUS | Status: DC | PRN

## 2018-05-01 MED ORDER — MAGNESIUM SULFATE 4 GM/100ML IV SOLN
4.0000 g | Freq: Once | INTRAVENOUS | Status: AC
  Administered 2018-05-01: 4 g via INTRAVENOUS
  Filled 2018-05-01: qty 100

## 2018-05-01 MED ORDER — LIDOCAINE-PRILOCAINE 2.5-2.5 % EX CREA
1.0000 "application " | TOPICAL_CREAM | CUTANEOUS | Status: DC | PRN
  Filled 2018-05-01: qty 5

## 2018-05-01 MED ORDER — MAGNESIUM SULFATE 2 GM/50ML IV SOLN: 2.0000 g | Freq: Once | INTRAVENOUS | Status: DC

## 2018-05-01 MED ORDER — PENTAFLUOROPROP-TETRAFLUOROETH EX AERO
1.0000 "application " | INHALATION_SPRAY | CUTANEOUS | Status: DC | PRN
  Administered 2018-05-01: 2 via TOPICAL
  Filled 2018-05-01: qty 103.5

## 2018-05-01 NOTE — Progress Notes (Signed)
Patient ID: Trevor [name], male   DOB: 12-Jun-1946, 72 y.o.   MRN: [id number]   LOS: 1 day   Subjective: Trevor [name] was readmitted to [facility] from [facility] yesterday. During his [facility] admission he underwent repeat aspiration of his left knee which proved to be septic. He underwent OR I&D 6/7 and 6/15. He notes knee is still hurting but stable.    Objective: Vital signs in last 24 hours: Temp:  [97.6 F (36.4 C)-98 F (36.7 C)] 98 F (36.7 C) (06/28 0800) Pulse Rate:  [56-118] 71 (06/28 0806) Resp:  [12-22] 17 (06/28 0806) BP: (103-112)/(76-91) 107/91 (06/28 0800) SpO2:  [69 %-100 %] 98 % (06/28 0806) Weight:  [80.7 kg (177 lb 14.4 oz)-85.6 kg (188 lb 11.4 oz)] 80.7 kg (177 lb 14.4 oz) (06/28 0815) Last BM Date: 04/30/18   Laboratory  CBC Recent Labs    04/30/18 1651 05/01/18 0425  WBC 16.1* 14.2*  HGB 9.7* 9.5*  HCT 32.8* 31.6*  PLT 181 147*   BMET Recent Labs    05/01/18 0425  NA 136  K 3.7  CL 100  CO2 26  GLUCOSE 144*  BUN 19  CREATININE 6.09*  CALCIUM 7.7*     Physical Exam General appearance: alert and no distress  Left knee: Surgical incision C/D/I, mild erythema, PROM 180-145   Assessment/Plan: Left knee septic arthritis s/p I&D -- Will plan on suture removal Monday. Encourage AROM to prevent stiffness.    Trevor Abu, PA-C Orthopedic Surgery [phone] 05/01/2018

## 2018-05-01 NOTE — Progress Notes (Signed)
Advanced Heart Failure VAD Team Note  Subjective:     Feels ok. Happy to be back at [facility]. Fatigued but denies SOB, orthopnea or PND. On HD now.   Resumed heparin and coumadin yesterday. Hemoglobin 9.5. INR 1.54. Denies any bleeding.  Left knee still sore. Remains on Ancef. No fevers or chills   LDH 297   LVAD INTERROGATION:  HeartMate-2 LVAD:   Flow 4.9 liters/min, speed 9200, power 5.0, PI 5.8.    Objective:    Vital Signs:   Temp:  [97.8 F (36.6 C)-98.1 F (36.7 C)] 98.1 F (36.7 C) (06/28 1933) Pulse Rate:  [47-94] 79 (06/28 1953) Resp:  [10-22] 10 (06/28 1953) BP: (73-118)/(57-99) 87/78 (06/28 2100) SpO2:  [92 %-99 %] 99 % (06/28 1953) Weight:  [77.9 kg (171 lb 11.8 oz)-80.7 kg (177 lb 14.4 oz)] 77.9 kg (171 lb 11.8 oz) (06/28 1300) Last BM Date: 04/30/18 Mean arterial Pressure 70-80s  Intake/Output:   Intake/Output Summary (Last 24 hours) at 05/01/2018 2143 Last data filed at 05/01/2018 1900 Gross per 24 hour  Intake 310.76 ml  Output 3600 ml  Net -3289.24 ml     Physical Exam    General:  Sitting up in bed. No resp difficulty HEENT: normal Neck: supple. JVP 9-10 . Carotids 2+ bilat; no bruits. No lymphadenopathy or thyromegaly appreciated. Cor: Mechanical heart sounds with LVAD hum present. Lungs: clear no wheeze Abdomen: soft, nontender, nondistended. No hepatosplenomegaly. No bruits or masses. Good bowel sounds. Driveline: C/D/I; securement device intact and driveline incorporated Extremities: no cyanosis, clubbing, rash, 2+ edema. Healing wound on L knee  On HD through RUE AVF Neuro: alert & orientedx3, cranial nerves grossly intact. moves all 4 extremities w/o difficulty. Affect pleasant   Telemetry   AF 80s Personally reviewed   Labs   Basic Metabolic Panel: Recent Labs  Lab 05/01/18 0425  NA 136  K 3.7  CL 100  CO2 26  GLUCOSE 144*  BUN 19  CREATININE 6.09*  CALCIUM 7.7*  MG 1.5*    Liver Function Tests: No results for  input(s): AST, ALT, ALKPHOS, BILITOT, PROT, ALBUMIN in the last 168 hours. No results for input(s): LIPASE, AMYLASE in the last 168 hours. No results for input(s): AMMONIA in the last 168 hours.  CBC: Recent Labs  Lab 04/30/18 1651 05/01/18 0425  WBC 16.1* 14.2*  HGB 9.7* 9.5*  HCT 32.8* 31.6*  MCV 96.5 96.0  PLT 181 147*    INR: Recent Labs  Lab 04/30/18 1651 05/01/18 0425  INR 1.36 1.54    Other results:  Imaging    No results found.   Medications:     Scheduled Medications: . atorvastatin  40 mg Oral q1800  . budesonide  0.25 mg Nebulization BID  . busPIRone  5 mg Oral BID  . calcitRIOL  0.5 mcg Oral Q M,W,F  . Chlorhexidine Gluconate Cloth  6 each Topical Q0600  . insulin aspart  0-15 Units Subcutaneous TID WC  . insulin aspart  0-5 Units Subcutaneous QHS  . insulin aspart  3 Units Subcutaneous TID WC  . levothyroxine  25 mcg Oral QAC breakfast  . pantoprazole (PROTONIX) IV  40 mg Intravenous Q12H  . senna-docusate  1 tablet Oral BID  . Warfarin - Pharmacist Dosing Inpatient   Does not apply q1800     Infusions: . sodium chloride    . sodium chloride    .  ceFAZolin (ANCEF) IV 1 g (04/30/18 2127)  . heparin 1,100 Units/hr (05/01/18  1626)     PRN Medications:  sodium chloride, sodium chloride, acetaminophen, albuterol, benzonatate, lidocaine (PF), lidocaine-prilocaine, ondansetron (ZOFRAN) IV, oxyCODONE, pentafluoroprop-tetrafluoroeth   Patient Profile   Trevor Iovino Murphyis a 72 y.o.malewith a history of CAD s/p CABG x 4 2010, OSA, AS with TAVR 2015, ,ESRD on HD,DM2,chronicatrial fibrillation, asthma,GI bleed,and ischemic cardiomyopathy with Medtronic ICD.S/PHMII LVAD for Destination therapy on 12/14/2015.  Transferred back from [place] on 6/27 after undergoing enterotomy and clipping of a colonic Dueilafoy lesion as well I&D of septic left knee.   Assessment/Plan:    1. GI Bleed/Symptomatic Anemia: GI AVMs. Double balloon retro  endoscopy at [facility], unable to identify bleeding lesion. He failed attempted IR embolization x 3 while at [facility]. 12 units PRBCs at [facility]. Received 15 uPRBC while at [facility]. IR consulted, but did not find any active bleeding on CTA 5/20. Repeat capsule study showed active bleeding in the mid ileum. Surgery was reconsulted and recommended transfer back to [facility] for possible surgery.  - Now s/p laparoscopic surgical enteroscopy with 4 clips on 6/7 at [facility] - C/b ischemic bowel/SBO. Now tolerating po. Advance diet - Heparin/coumadin restarted 6/27. No bleeding currently. Discussed dosing with PharmD personally. - Hemoglobin stable 9.5 this am.   2. 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 S/p HM II LVAD placed 2017 for DT. Has Medtronic ICD.  - Volume status elevated. Getting HD today and tomorrow - VAD interrogated personally. Parameters stable. - Volume managed by HD.  - No bb with RV failure.  - Continue sildenafil 40 mg tid.  - INR goal 1.8-2.2  3. VAD, HM-II - VAD interrogated personally. Parameters stable. - LDH 297 - INR 1.54 (Goal 1.8-2.2) Continue heparin and warfarin. Stop heparin when INR >= 1.8 - No ASA with GIB  4. ESRD- M/W/F/Sat.  - HD per Renal. Getting dialyzed currently will also get HD tomorrow - Appreciate Renal's help  5. Chronic A fib: - Rate controlled. - As above, continue heparin and coumadin  6. HTN:  - MAPs 70-80  7. Septic left knee - s/p several aspirations and OR wash out x2. Aspirate cultures positive for staph epi and clostridium perfinge. Blood cultures negative at [facility].  - On Ancef through 6/28 - Ortho has seen today. Stitches out early next week  8. DM II - SSI  9. Hx of CAD s/p CABG. - No s/s ischemia. - Continue statin  9. Hx of TAVR:  - Valve stable on last echo 12/2017  10. Deconditioning - PT/OT  & CIR are following  11. Hypomagnesemia - supp  I reviewed the LVAD parameters from today, and compared the results  to the patient's prior recorded data.  No programming changes were made.  The LVAD is functioning within specified parameters.  The patient performs LVAD self-test daily.  LVAD interrogation was negative for any significant power changes, alarms or PI events/speed drops.  LVAD equipment check completed and is in good working order.  Back-up equipment present.   LVAD education done on emergency procedures and precautions and reviewed exit site care.  Length of Stay: 1  Glori Bickers, MD 05/01/2018, 9:43 PM  VAD Team --- VAD ISSUES ONLY--- Pager [phone] (JAARS)  Advanced Heart Failure Team  Pager [phone] (M-F; 7a - 4p)  Please contact [facility] for night-coverage after hours (4p -7a ) and weekends on [email or web address]

## 2018-05-01 NOTE — Progress Notes (Signed)
ANTICOAGULATION CONSULT NOTE  Pharmacy Consult for warfarin Indication: LVAD  No Known Allergies  Patient Measurements: Height: 5\' 5"  (165.1 cm) Weight: 177 lb 14.4 oz (80.7 kg) IBW/kg (Calculated) : 61.5 Heparin Dosing Weight: 79.5 kg  Vital Signs: Temp: 97.8 F (36.6 C) (06/28 0325) Temp Source: Oral (06/28 0325) BP: 108/82 (06/28 0325) Pulse Rate: 88 (06/28 0325)  Labs: Recent Labs    04/30/18 1651 05/01/18 0425  HGB 9.7* 9.5*  HCT 32.8* 31.6*  PLT 181 147*  LABPROT 16.7* 18.3*  INR 1.36 1.54  HEPARINUNFRC  --  0.12*  CREATININE  --  6.09*    Estimated Creatinine Clearance: 10.7 mL/min (A) (by C-G formula based on SCr of 6.09 mg/dL (H)).   Medical History: Past Medical History:  Diagnosis Date  . AICD (automatic cardioverter/defibrillator) present   . Asthma   . AVM (arteriovenous malformation) of colon 07/07/2017  . CHF (congestive heart failure) ([place])   . Diabetes ([place])   . ESRF (end stage renal failure) ([place]) 07/07/2017   pt receiving hemodialysis Monday- Wednesday-friday, sometimes Saturday  . Kidney disease   . LVAD (left ventricular assist device) present ([place]) 12/2016  . Permanent atrial fibrillation ([place]) 07/07/2017  . Presence of permanent cardiac pacemaker    AICD  . Sleep apnea     Medications:  Scheduled:  . atorvastatin  40 mg Oral q1800  . budesonide  0.25 mg Nebulization BID  . busPIRone  5 mg Oral BID  . calcitRIOL  0.5 mcg Oral Q M,W,F  . Chlorhexidine Gluconate Cloth  6 each Topical Q0600  . insulin aspart  0-15 Units Subcutaneous TID WC  . insulin aspart  0-5 Units Subcutaneous QHS  . insulin aspart  3 Units Subcutaneous TID WC  . levothyroxine  25 mcg Oral QAC breakfast  . pantoprazole (PROTONIX) IV  40 mg Intravenous Q12H  . senna-docusate  1 tablet Oral BID  . Warfarin - Pharmacist Dosing Inpatient   Does not apply q1800    Assessment: 8 yom who underwent HM2 LVAD implantation on 12/14/2015. Has undergoing extensive GI workup  to evaluate bleeding. Was stopped on anticoagulants as of admission on 03/09/2018. Last outpatient regimen was (as of 02/17/2018): 7.5 mg daily except 10 mg on Tues/Thurs for goal 2-2.5. Goal INR was decreased to 1.8-2.3 after admission on 02/28/2018. Now s/p laparoscopic surgical enteroscopy with 4 clips on 04/10/2018 - okay to resume heparin and warfarin 6/27.   Hgb today is 9.5, plt 147. INR is 1.54. No s/sx of bleeding noted at this time.   Goal of Therapy:  Heparin level: 0.3-0.5 INR goal: 1.8-2.2 Monitor platelets by anticoagulation protocol: Yes   Plan:  Will order warfarin 5 mg x 1 tonight Daily PT/INR.  Marguerite Olea, [facility] Clinical Pharmacist Phone [phone]  05/01/2018 6:36 AM

## 2018-05-01 NOTE — Progress Notes (Signed)
Complained of pain on the left forearm and claimed that there is knot formed  on it.. When checked site is soft  ,  Warm to touch. + pulses. Arm elevated with pillow. Oxycodone given for pain. Continue to monitor.

## 2018-05-01 NOTE — Progress Notes (Signed)
PT Cancellation Note  Patient Details Name: Trevor [name] MRN: [id number] DOB: [birth year]   Cancelled Treatment:    Reason Eval/Treat Not Completed: Patient declined, no reason specified(pt completed HD and reports fatigue, pt deferred until next date)   [name] B [name] 05/01/2018, 1:33 PM [name], [place]

## 2018-05-01 NOTE — Progress Notes (Signed)
ANTICOAGULATION CONSULT NOTE  Pharmacy Consult for heparin Indication: LVAD  No Known Allergies  Patient Measurements: Height: 5\' 5"  (165.1 cm) Weight: 171 lb 11.8 oz (77.9 kg) IBW/kg (Calculated) : 61.5 Heparin Dosing Weight: 79.5 kg  Vital Signs: Temp: 97.9 F (36.6 C) (06/28 1430) Temp Source: Oral (06/28 1430) BP: 84/73 (06/28 1300) Pulse Rate: 84 (06/28 1300)  Labs: Recent Labs    04/30/18 1651 05/01/18 0425 05/01/18 1338  HGB 9.7* 9.5*  --   HCT 32.8* 31.6*  --   PLT 181 147*  --   LABPROT 16.7* 18.3*  --   INR 1.36 1.54  --   HEPARINUNFRC  --  0.12* >2.20*  CREATININE  --  6.09*  --     Estimated Creatinine Clearance: 10.6 mL/min (A) (by C-G formula based on SCr of 6.09 mg/dL (H)).   Medical History: Past Medical History:  Diagnosis Date  . AICD (automatic cardioverter/defibrillator) present   . Asthma   . AVM (arteriovenous malformation) of colon 07/07/2017  . CHF (congestive heart failure) ([place])   . Diabetes ([place])   . ESRF (end stage renal failure) ([place]) 07/07/2017   pt receiving hemodialysis Monday- Wednesday-friday, sometimes Saturday  . Kidney disease   . LVAD (left ventricular assist device) present ([place]) 12/2016  . Permanent atrial fibrillation ([place]) 07/07/2017  . Presence of permanent cardiac pacemaker    AICD  . Sleep apnea    Assessment: 48 yom who underwent HM2 LVAD implantation on 12/14/2015. Has undergoing extensive GI workup to evaluate bleeding. Was stopped on anticoagulants as of admission on 03/09/2018. Last outpatient regimen was (as of 02/17/2018): 7.5 mg daily except 10 mg on Tues/Thurs for goal 2-2.5. Goal INR was decreased to 1.8-2.3 after admission on 02/28/2018. Now s/p laparoscopic surgical enteroscopy with 4 clips on 04/10/2018 - okay to resume heparin and warfarin 6/27.   Earlier heparin level was >2.2, likely contaminated, discussed with lab and repeated level which is now within goal range.   Goal of Therapy:  Heparin level:  0.3-0.5 INR goal: 1.8-2.2 Monitor platelets by anticoagulation protocol: Yes   Plan:  Continue heparin at 1100 units/hr Daily heparin level and INR  Erin Hearing PharmD., BCPS Clinical Pharmacist 05/01/2018 3:40 PM

## 2018-05-01 NOTE — Consult Note (Signed)
Physical Medicine and Rehabilitation Consult Reason for Consult: Decreased functional mobility Referring Physician: [name]   HPI: Trevor [name] is a 72 y.o. right-handed male with complex medical history of CAD with CABG in 2010, OSA, aortic stenosis with TAVR 2015, end-stage renal disease with hemodialysis, diabetes mellitus, chronic atrial fibrillation on Coumadin in the past and recently resumed, recurrent GI bleed with follow-up treatment at [facility] and has received multiple transfusions and recently with double-balloon retro-endoscopy, ischemic cardiomyopathy with Medtronic ICD status post LVAD 12/14/2015.  Per chart review patient lives with sister.  Had been independent prior to latest extended hospital admission.  One level home with one-step to entry.  Sister is physically limited.  Presented 04/30/2018 after being at [facility] for an extended time due to GI bleed and return back to [facility] for ongoing management with follow-up by cardiology services as well as renal services for end-stage renal disease.  MRSA contact precautions.  Patient noted to be deconditioned with physical and Occupational Therapy evaluations pending.  MD has requested physical medicine rehab consult.   Review of Systems  Constitutional: Positive for malaise/fatigue. Negative for chills and fever.  HENT: Negative for hearing loss.   Eyes: Negative for blurred vision and double vision.  Respiratory: Positive for cough and shortness of breath.   Cardiovascular: Positive for leg swelling.  Gastrointestinal: Positive for constipation and nausea. Negative for vomiting.  Genitourinary: Negative for flank pain and hematuria.  Musculoskeletal: Positive for joint pain and myalgias.  Skin: Negative for rash.  All other systems reviewed and are negative.  Past Medical History:  Diagnosis Date  . AICD (automatic cardioverter/defibrillator) present   . Asthma   . AVM (arteriovenous  malformation) of colon 07/07/2017  . CHF (congestive heart failure) ([place])   . Diabetes ([place])   . ESRF (end stage renal failure) ([place]) 07/07/2017   pt receiving hemodialysis Monday- Wednesday-friday, sometimes Saturday  . Kidney disease   . LVAD (left ventricular assist device) present ([place]) 12/2016  . Permanent atrial fibrillation ([place]) 07/07/2017  . Presence of permanent cardiac pacemaker    AICD  . Sleep apnea    Past Surgical History:  Procedure Laterality Date  . AORTIC VALVE REPLACEMENT  2015   [place]  . APPLICATION OF A-CELL OF EXTREMITY Left 11/20/2017   Procedure: APPLICATION OF A-CELL;  Surgeon: Wallace Going, DO;  Location: [place];  Service: Plastics;  Laterality: Left;  . AV FISTULA PLACEMENT Left 06/30/2017   Procedure: CREATION of LEFT ARM Brachiocephalic Fistula;  Surgeon: Conrad Quail Creek, MD;  Location: [place];  Service: Vascular;  Laterality: Left;  . CARDIAC CATHETERIZATION N/A 12/02/2016   Procedure: Right Heart Cath;  Surgeon: Larey Dresser, MD;  Location: [place] CV LAB;  Service: Cardiovascular;  Laterality: N/A;  . COLONOSCOPY N/A 02/14/2017   Procedure: COLONOSCOPY;  Surgeon: Milus Banister, MD;  Location: [place];  Service: Endoscopy;  Laterality: N/A;  . COLONOSCOPY WITH PROPOFOL N/A 02/27/2018   Procedure: COLONOSCOPY WITH PROPOFOL;  Surgeon: Jerene Bears, MD;  Location: [place];  Service: Gastroenterology;  Laterality: N/A;  . CORONARY ANGIOPLASTY WITH STENT PLACEMENT  2013   in [place]  . CORONARY ARTERY BYPASS GRAFT  2010   in [place]  . DIALYSIS/PERMA CATHETER INSERTION  06/30/2017   Procedure: INSERTION Dialysis Catheter;  Surgeon: Conrad Menlo, MD;  Location: [place];  Service: Vascular;;  . ESOPHAGOGASTRODUODENOSCOPY (EGD) WITH PROPOFOL N/A 02/27/2018   Procedure:  ESOPHAGOGASTRODUODENOSCOPY (EGD) WITH PROPOFOL;  Surgeon: Jerene Bears, MD;  Location: [place];  Service: Gastroenterology;  Laterality: N/A;  . GIVENS CAPSULE STUDY N/A  02/27/2018   Procedure: GIVENS CAPSULE STUDY;  Surgeon: Jerene Bears, MD;  Location: [place];  Service: Gastroenterology;  Laterality: N/A;  . GIVENS CAPSULE STUDY N/A 03/25/2018   Procedure: GIVENS CAPSULE STUDY;  Surgeon: Jackquline Denmark, MD;  Location: [facility] ENDOSCOPY;  Service: Endoscopy;  Laterality: N/A;  . IABP INSERTION N/A 12/11/2016   Procedure: IABP Insertion;  Surgeon: Larey Dresser, MD;  Location: [place] CV LAB;  Service: Cardiovascular;  Laterality: N/A;  . INSERTION OF IMPLANTABLE LEFT VENTRICULAR ASSIST DEVICE N/A 12/13/2016   Procedure: INSERTION OF IMPLANTABLE LEFT VENTRICULAR ASSIST DEVICE;  Surgeon: Ivin Poot, MD;  Location: [place];  Service: Open Heart Surgery;  Laterality: N/A;  HEARTMATE II  NITRIC OXIDE  . IR FLUORO GUIDE CV LINE LEFT  06/17/2017  . IR US GUIDE VASC ACCESS LEFT  06/17/2017  . MASS EXCISION Left 11/20/2017   Procedure: EXCISION OF LEFT NECK AND RIGHT LEG SKIN CANCER WITH A CELL PLACEMENT;  Surgeon: Wallace Going, DO;  Location: [place];  Service: Plastics;  Laterality: Left;  Marland Kitchen MASS EXCISION Left 11/26/2017   Procedure: EXPLORATION AND EXCISION OF LEFT NECK;  Surgeon: Wallace Going, DO;  Location: [place];  Service: Plastics;  Laterality: Left;  . RIGHT HEART CATH N/A 12/11/2016   Procedure: Right Heart Cath;  Surgeon: Larey Dresser, MD;  Location: [place] CV LAB;  Service: Cardiovascular;  Laterality: N/A;  . RIGHT HEART CATH N/A 03/27/2017   Procedure: Right Heart Cath;  Surgeon: Larey Dresser, MD;  Location: [place] CV LAB;  Service: Cardiovascular;  Laterality: N/A;  . RIGHT HEART CATH N/A 06/13/2017   Procedure: RIGHT HEART CATH;  Surgeon: Jolaine Artist, MD;  Location: [place] CV LAB;  Service: Cardiovascular;  Laterality: N/A;  . TEE WITHOUT CARDIOVERSION N/A 12/13/2016   Procedure: TRANSESOPHAGEAL ECHOCARDIOGRAM (TEE);  Surgeon: Ivin Poot, MD;  Location: [place];  Service: Open Heart Surgery;  Laterality: N/A;  .  TRICUSPID VALVE REPLACEMENT N/A 12/13/2016   Procedure: TRICUSPID VALVE REPAIR WITH EDWARDS MC 3 TRICUSPID ANNULOPLASTY RING MODEL 4900 SIZE T 28;  Surgeon: Ivin Poot, MD;  Location: [place];  Service: Open Heart Surgery;  Laterality: N/A;   Family History  Problem Relation Age of Onset  . Heart failure Father   . Heart attack Father   . Healthy Mother   . Diabetes Paternal Grandfather    Social History:  reports that he has never smoked. He has never used smokeless tobacco. He reports that he does not drink alcohol or use drugs. Allergies: No Known Allergies Medications Prior to Admission  Medication Sig Dispense Refill  . acetaminophen (TYLENOL) 325 MG tablet Take 2 tablets (650 mg total) by mouth every 4 (four) hours as needed for headache or mild pain.    Marland Kitchen albuterol (PROVENTIL HFA;VENTOLIN HFA) 108 (90 Base) MCG/ACT inhaler Inhale 2 puffs into the lungs every 4 (four) hours as needed for wheezing or shortness of breath. 3 Inhaler 5  . atorvastatin (LIPITOR) 40 MG tablet TAKE 1 TABLET(40 MG) BY MOUTH DAILY (Patient taking differently: Take 40 mg by mouth daily at 6 PM. ) 30 tablet 11  . busPIRone (BUSPAR) 5 MG tablet Take 1 tablet (5 mg total) by mouth 2 (two) times daily. 180 tablet 3  . calcitRIOL (ROCALTROL) 0.5  MCG capsule Take 1 capsule (0.5 mcg total) by mouth every Monday, Wednesday, and Friday with hemodialysis.    Marland Kitchen danazol (DANOCRINE) 100 MG capsule Take 1 capsule (100 mg total) by mouth 2 (two) times daily. 60 capsule 5  . Darbepoetin Alfa (ARANESP) 200 MCG/0.4ML SOSY injection Inject 0.4 mLs (200 mcg total) into the vein every Monday with hemodialysis. 1.68 mL   . diphenhydrAMINE (BENADRYL) 25 mg capsule Take 1 capsule (25 mg total) by mouth as needed for allergies (30 min prior to blood transfusion). 30 capsule 0  . fluticasone (FLOVENT HFA) 110 MCG/ACT inhaler Inhale 2 puffs into the lungs 2 (two) times daily. 3 Inhaler 5  . gabapentin (NEURONTIN) 600 MG tablet Take 0.5  tablets (300 mg total) by mouth 2 (two) times daily.    Marland Kitchen guaiFENesin (ROBITUSSIN) 100 MG/5ML SOLN Take 5 mLs (100 mg total) by mouth every 4 (four) hours as needed for cough or to loosen phlegm. 1200 mL 0  . heparin 1000 unit/mL SOLN injection 1 mL (1,000 Units total) by Dialysis route as needed (in dialysis).    . insulin glargine (LANTUS) 100 UNIT/ML injection Inject 0.08 mLs (8 Units total) into the skin at bedtime. 10 mL 11  . levothyroxine (SYNTHROID, LEVOTHROID) 25 MCG tablet Take 1 tablet (25 mcg total) by mouth daily before breakfast. 90 tablet 3  . magnesium oxide (MAG-OX) 400 MG tablet Take 400 mg by mouth daily.    . metoCLOPramide (REGLAN) 10 MG/10ML SOLN Take 5 mLs (5 mg total) by mouth 4 (four) times daily -  before meals and at bedtime. 600 mL 0  . mometasone-formoterol (DULERA) 200-5 MCG/ACT AERO Inhale 2 puffs into the lungs 2 (two) times daily.    . multivitamin (RENA-VIT) TABS tablet Take 1 tablet by mouth at bedtime. 30 tablet 6  . octreotide 500 mcg in sodium chloride 0.9 % 250 mL Inject 50 mcg/hr into the vein continuous.    . ondansetron (ZOFRAN) 4 MG/2ML SOLN injection Inject 2 mLs (4 mg total) into the vein every 6 (six) hours as needed for nausea. 2 mL 0  . pantoprazole (PROTONIX) 40 MG tablet Take 1 tablet (40 mg total) by mouth daily. 30 tablet 5  . predniSONE (DELTASONE) 20 MG tablet Take 3 tablets (60 mg total) by mouth daily with breakfast.    . sildenafil (REVATIO) 20 MG tablet Take 2 tablets (40 mg total) 3 (three) times daily by mouth. (Patient taking differently: Take 40 mg by mouth See admin instructions. Take two tablets (40 mg) by mouth three times a day- midday, afternoon, and bedtime) 180 tablet 6  . traMADol (ULTRAM) 50 MG tablet Take 1 tablet (50 mg total) by mouth every 12 (twelve) hours as needed. TAKE 1 TABLET BY MOUTH EVERY 6 HOURS AS NEEDED FOR MODERATE PAIN (Patient taking differently: Take 50 mg by mouth every 6 (six) hours as needed for moderate pain.  ) 60 tablet 3  . traZODone (DESYREL) 100 MG tablet TAKE 1 TABLET BY MOUTH EVERY NIGHT AT BEDTIME AS NEEDED FOR SLEEP (Patient taking differently: TAKE 1 TABLET BY MOUTH EVERY NIGHT AT BEDTIME) 30 tablet 0    Home: Home Living Living Arrangements: Other relatives  Functional History:   Functional Status:  Mobility:          ADL:    Cognition: Cognition Orientation Level: Oriented X4    Blood pressure 108/82, pulse 88, temperature 97.8 F (36.6 C), temperature source Oral, resp. rate 13, height 5\' 5"  (1.651  m), weight 85.6 kg (188 lb 11.4 oz), SpO2 98 %. Physical Exam  Vitals reviewed. Constitutional: No distress.  HENT:  Head: Normocephalic and atraumatic.  Eyes: Pupils are equal, round, and reactive to light. EOM are normal.  Neck: Normal range of motion.  Cardiovascular:  Hum from LVAD  Respiratory: Effort normal.  GI: Soft.  Musculoskeletal: He exhibits edema (1+ bilateral LE).  Left knee remains tender/warm  Neurological:  Patient is alert and follows commands.  [place]. UE grossly 3+ to 4/5 prox to distal. LE 3- to 3/5 HF to 4- ADF/PF. Left knee limited by pain  Skin:  Left knee incision intact with sutures, multiple abrasions and ecchymoses on skin  Psychiatric: He has a normal mood and affect. His behavior is normal.    Results for orders placed or performed during the hospital encounter of 04/30/18 (from the past 24 hour(s))  Glucose, capillary     Status: Abnormal   Collection Time: 04/30/18 12:23 PM  Result Value Ref Range   Glucose-Capillary 142 (H) 70 - 99 mg/dL  Protime-INR     Status: Abnormal   Collection Time: 04/30/18  4:51 PM  Result Value Ref Range   Prothrombin Time 16.7 (H) 11.4 - 15.2 seconds   INR 1.36   CBC     Status: Abnormal   Collection Time: 04/30/18  4:51 PM  Result Value Ref Range   WBC 16.1 (H) 4.0 - 10.5 K/uL   RBC 3.40 (L) 4.22 - 5.81 MIL/uL   Hemoglobin 9.7 (L) 13.0 - 17.0 g/dL   HCT 32.8 (L) 39.0 - 52.0 %     MCV 96.5 78.0 - 100.0 fL   MCH 28.5 26.0 - 34.0 pg   MCHC 29.6 (L) 30.0 - 36.0 g/dL   RDW 19.7 (H) 11.5 - 15.5 %   Platelets 181 150 - 400 K/uL  Glucose, capillary     Status: None   Collection Time: 04/30/18  4:56 PM  Result Value Ref Range   Glucose-Capillary 90 70 - 99 mg/dL   Comment 1 Notify RN    Comment 2 Document in Chart   MRSA PCR Screening     Status: None   Collection Time: 04/30/18  6:30 PM  Result Value Ref Range   MRSA by PCR NEGATIVE NEGATIVE  Glucose, capillary     Status: Abnormal   Collection Time: 04/30/18  9:24 PM  Result Value Ref Range   Glucose-Capillary 125 (H) 70 - 99 mg/dL  Lactate dehydrogenase     Status: Abnormal   Collection Time: 05/01/18  4:25 AM  Result Value Ref Range   LDH 297 (H) 98 - 192 U/L  Protime-INR     Status: Abnormal   Collection Time: 05/01/18  4:25 AM  Result Value Ref Range   Prothrombin Time 18.3 (H) 11.4 - 15.2 seconds   INR 1.54   CBC     Status: Abnormal   Collection Time: 05/01/18  4:25 AM  Result Value Ref Range   WBC 14.2 (H) 4.0 - 10.5 K/uL   RBC 3.29 (L) 4.22 - 5.81 MIL/uL   Hemoglobin 9.5 (L) 13.0 - 17.0 g/dL   HCT 31.6 (L) 39.0 - 52.0 %   MCV 96.0 78.0 - 100.0 fL   MCH 28.9 26.0 - 34.0 pg   MCHC 30.1 30.0 - 36.0 g/dL   RDW 19.9 (H) 11.5 - 15.5 %   Platelets 147 (L) 150 - 400 K/uL  Basic metabolic panel  Status: Abnormal   Collection Time: 05/01/18  4:25 AM  Result Value Ref Range   Sodium 136 135 - 145 mmol/L   Potassium 3.7 3.5 - 5.1 mmol/L   Chloride 100 98 - 111 mmol/L   CO2 26 22 - 32 mmol/L   Glucose, Bld 144 (H) 70 - 99 mg/dL   BUN 19 8 - 23 mg/dL   Creatinine, Ser 6.09 (H) 0.61 - 1.24 mg/dL   Calcium 7.7 (L) 8.9 - 10.3 mg/dL   GFR calc non Af Amer 8 (L) >60 mL/min   GFR calc Af Amer 10 (L) >60 mL/min   Anion gap 10 5 - 15  Magnesium     Status: Abnormal   Collection Time: 05/01/18  4:25 AM  Result Value Ref Range   Magnesium 1.5 (L) 1.7 - 2.4 mg/dL  Heparin level (unfractionated)      Status: Abnormal   Collection Time: 05/01/18  4:25 AM  Result Value Ref Range   Heparin Unfractionated 0.12 (L) 0.30 - 0.70 IU/mL   No results found.   Assessment/Plan: Diagnosis: 72 yo male with ICM and subsequent LVAD, prolonged hospital course at [facility] and [facility], now severely deconditioned 1. Does the need for close, 24 hr/day medical supervision in concert with the patient's rehab needs make it unreasonable for this patient to be served in a less intensive setting? Yes and Potentially 2. Co-Morbidities requiring supervision/potential complications: COPD, ESRD on HD 3. Due to bladder management, bowel management, safety, skin/wound care, disease management, medication administration, pain management and patient education, does the patient require 24 hr/day rehab nursing? Yes and Potentially 4. Does the patient require coordinated care of a physician, rehab nurse, PT (1-2 hrs/day, 5 days/week) and OT (1-2 hrs/day, 5 days/week) to address physical and functional deficits in the context of the above medical diagnosis(es)? Yes and Potentially Addressing deficits in the following areas: balance, endurance, locomotion, strength, transferring, bowel/bladder control, bathing, dressing, feeding, grooming, toileting and psychosocial support 5. Can the patient actively participate in an intensive therapy program of at least 3 hrs of therapy per day at least 5 days per week? Potentially 6. The potential for patient to make measurable gains while on inpatient rehab is good and fair 7. Anticipated functional outcomes upon discharge from inpatient rehab are modified independent, supervision and min assist  with PT, modified independent, supervision and min assist with OT, n/a with SLP. 8. Estimated rehab length of stay to reach the above functional goals is: TBD 9. Anticipated D/C setting: Home 10. Anticipated post D/C treatments: HH therapy and Outpatient therapy 11. Overall Rehab/Functional Prognosis:  good  RECOMMENDATIONS: This patient's condition is appropriate for continued rehabilitative care in the following setting: see below Patient has agreed to participate in recommended program. Yes Note that insurance prior authorization may be required for reimbursement for recommended care.  Comment: Pt/case familiar to me from prior [facility] chart review. Lives with sister who probably provide light assist at home. Has no steps in home and only one to enter. Will observe for activity tolerance and medical stability. Consider inpatient rehab stay depending upon progress. Rehab Admissions Coordinator to follow up.  Thanks,  Meredith Staggers, MD, Mellody Drown  I have personally performed a face to face diagnostic evaluation of this patient. Additionally, I have reviewed and concur with the physician assistant's documentation above.     Lavon Paganini Angiulli, PA-C 05/01/2018

## 2018-05-01 NOTE — Progress Notes (Signed)
ANTICOAGULATION CONSULT NOTE   Pharmacy Consult for heparin Indication: LVAD  No Known Allergies  Patient Measurements: Height: 5\' 5"  (165.1 cm) Weight: 188 lb 11.4 oz (85.6 kg) IBW/kg (Calculated) : 61.5 Heparin Dosing Weight: 79.5 kg  Vital Signs: Temp: 97.8 F (36.6 C) (06/28 0325) Temp Source: Oral (06/28 0325) BP: 108/82 (06/28 0325) Pulse Rate: 88 (06/28 0325)  Labs: Recent Labs    04/30/18 1651 05/01/18 0425  HGB 9.7* 9.5*  HCT 32.8* 31.6*  PLT 181 147*  LABPROT 16.7* 18.3*  INR 1.36 1.54  HEPARINUNFRC  --  0.12*    CrCl cannot be calculated (Patient's most recent lab result is older than the maximum 21 days allowed.).   Assessment: 50 yom who underwent HM2 LVAD implantation on 12/14/2015. Has undergoing extensive GI workup to evaluate bleeding. Was stopped on anticoagulants as of admission on 03/09/2018. Last outpatient regimen was (as of 02/17/2018): 7.5 mg daily except 10 mg on Tues/Thurs for goal 2-2.5. Goal INR was decreased to 1.8-2.3 after admission on 02/28/2018. Now s/p laparoscopic surgical enteroscopy with 4 clips on 04/10/2018 - okay to resume heparin and warfarin.   Hgb today is 9.7, plt 181. INR is 1.36. No s/sx of bleeding noted at this time.  Heparin level 0.12 units/ml  Goal of Therapy:  Heparin level: 0.3-0.5 INR goal: 1.8-2.2 Monitor platelets by anticoagulation protocol: Yes   Plan:  Increase heparin drip to 1100 units/hr Check heparin level 8 hours after rate change  Excell Seltzer, PharmD Clinical Pharmacist  05/01/2018,5:03 AM

## 2018-05-01 NOTE — Progress Notes (Signed)
PT Cancellation Note  Patient Details Name: Trevor [name] MRN: [id number] DOB: [birth year]   Cancelled Treatment:    Reason Eval/Treat Not Completed: Patient at procedure or test/unavailable(pt currently with HD in room)   [place] 05/01/2018, [place], [place]

## 2018-05-01 NOTE — Progress Notes (Signed)
LVAD Coordinator Rounding Note:  Transferred from [facility] on 04/30/18 following laparoscopic surgical enteroscopy on 6/7 and received 4 clips. He received 13 uPRBCs but had no further bleeding post-op. Last octreotide injection 6/13. Danazol was DC'd. ASA and coumadin were not resumed. VAD parameters were stable.   Course complicated by AMS and concern for ischemic bowel. Improved with NGT decompression and bowel rest. Head CT negative. Blood cultures and UA were negative. Tolerated clears and thought stable for diet advancement.    Additionally, required multiple left knee aspirations and OR washouts. Aspirate cultures positive for staph epi and clostridium perfingen. Started on Vanc and narrowed to Ancef. Sutures reassessed by ortho today and will be removed on Monday.  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 Darcey Nora under Destination Therapy criteria due to age excluding heart transplantation.  Pt in bed having dialysis treatment; he is in good spirits.  Vital signs: Temp:  98 HR: 82 Doppler Pressure:  99 Automatic BP:  107/91  O2 Sat: 95% RA Wt:171lbs  LVAD interrogation reveals:  Speed:  9200 Flow:  3.9  Power: 5.0 w PI: 7.2 Alarms: none Events: 1 PI-pt currently undergoing dialysis Fixed speed: 9200 Low speed limit: 8600   Drive Line: Weekly dressing changes per bedside RN - next dressing change due 05/07/18. Saline only, no bio-patch using weekly kit.   Labs:  LDH trend: 297  INR trend: 1.54  Hgb trend: 9.5  WBC: 14.2  Anticoagulation Plan: - INR Goal: 1.8-2.3 - ASA Dose: none due to hx of GI bleed - OP monthly Octreotide for hx of GI bleed  Blood Products:  None this admission  Device: - Medtronic single lead -Therapies: on 231 bpm  Arrythmias: chronic afib  Renal:  - chronic HD on M/W/F/Sat at 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 Margins not clear at neck. - 02/23/18> hospitalization for GI bleed. Colon/EGD 02/27/18 with normal EGD. Colonoscopy identified active bleeding in ileum; source not found. Capsule Endoscopy 02/27/18: Showed active bleeding in the ileum. Received 5 units blood with histamine reaction; will give benadryl as OP. - 03/10/18>transferrred to [facility] for double retro balloon scop and sent to IR emolization x 3 with no evidence of bleeding. Received 12 units PCs, remained off anticoagulants. Transferred back to [facility] 03/20/18 Seen by IR 5/20. CTA obtained, but no active bleeding found. Started on octreotide.  03/25/18: Repeat capsule endoscopy completed. Bleeding appears to come from the mid-ileum.  03/26/18: General surgery reconsulted. Recommended transfer back to [facility] for surgery with intraoperative endoscopy. [name] currently discussing options.  Ortho consulted 5/24 for left knee pain. S/p knee aspiration and steroid injection.   Dose of premarin given 5/24.   On 5/25 developed abdominal bloating and distension with n/v. KUB suggestive of ileus versus early SBO. NGT placed for decompression.  Had large bloody BM after this and felt much better, diet now advanced to regular.  - 04/01/18 transferred to [facility] for  laparoscopic surgical enteroscopy for recurrent GI bleed     Plan/Recommendations: 1.  Weekly dressing changes with special instructions (above). Bedside RN may change dressing; next due 05/07/18. 2.  Call VAD pager if any questions re: VAD equipment or drive line site issues.   [name], VAD Coordinator 24/7 VAD pager: [phone]

## 2018-05-01 NOTE — Progress Notes (Signed)
OT Cancellation Note  Patient Details Name: Trevor [name] MRN: [id number] DOB: 1946/10/12   Cancelled Treatment:    Reason Eval/Treat Not Completed: Patient at procedure or test/ unavailable.  Pt currently undergoing HD.  Will reattempt.  [place], OTR/L [phone]   Lucille Passy M 05/01/2018, 9:46 AM

## 2018-05-01 NOTE — Progress Notes (Signed)
[facility] Progress Note  Subjective: no new c/o  Vitals:   05/01/18 1145 05/01/18 1200 05/01/18 1215 05/01/18 1300  BP: (!) 86/71 (!) 87/77 (!) 73/60 (!) 84/73  Pulse: 79 87 84 84  Resp: 14 15 20 16   Temp:    97.8 F (36.6 C)  TempSrc:    Oral  SpO2:    97%  Weight:    77.9 kg (171 lb 11.8 oz)  Height:        Inpatient medications: . atorvastatin  40 mg Oral q1800  . budesonide  0.25 mg Nebulization BID  . busPIRone  5 mg Oral BID  . calcitRIOL  0.5 mcg Oral Q M,W,F  . Chlorhexidine Gluconate Cloth  6 each Topical Q0600  . insulin aspart  0-15 Units Subcutaneous TID WC  . insulin aspart  0-5 Units Subcutaneous QHS  . insulin aspart  3 Units Subcutaneous TID WC  . levothyroxine  25 mcg Oral QAC breakfast  . pantoprazole (PROTONIX) IV  40 mg Intravenous Q12H  . senna-docusate  1 tablet Oral BID  . warfarin  5 mg Oral ONCE-1800  . Warfarin - Pharmacist Dosing Inpatient   Does not apply q1800   . sodium chloride    . sodium chloride    .  ceFAZolin (ANCEF) IV 1 g (04/30/18 2127)  . heparin 1,100 Units/hr (05/01/18 0509)  . magnesium sulfate 1 - 4 g bolus IVPB 4 g (05/01/18 1323)   sodium chloride, sodium chloride, acetaminophen, albuterol, benzonatate, lidocaine (PF), lidocaine-prilocaine, ondansetron (ZOFRAN) IV, oxyCODONE, pentafluoroprop-tetrafluoroeth  Exam: General: Well developed, well nourished, in no acute distress. Head: Normocephalic, atraumatic, sclera non-icteric, mucus membranes are moist. Neck: Supple without lymphadenopathy/masses. JVD not elevated. Lungs: Clear bilaterally to auscultation without wheezes, rales, or rhonchi. Breathing is unlabored. Heart: RRR with normal S1, S2. No murmurs, rubs, or gallops appreciated. Abdomen: Soft, non-tender, non-distended with normoactive bowel sounds.  Musculoskeletal:  Strength and tone appear normal for age. Lower extremities: 2+ LE and LUE edema Neuro: Alert and oriented X 3. Moves all extremities  spontaneously. Psych:  Responds to questions appropriately with a normal affect. Dialysis Access: L AVF + bruit   Dialysis: [facility]  4.5h  83 kg (prob is lower now)  2/2 bath  LUA AVF  Hep none      Impression: 1. Lower GI bleeding: recurrent problem; per GI and primary team. Hgb 9- 10 range 2. S/p L knee washout: septic arthritis, recent diagnosis. Looks like finishing Cefazolin this week. Will need sutures removed soon.  3.  ESRD: cont HD schedule (MWFSa). Next 6/29. 3L UF goal. + edema. Keep MAP > 60 on dialysis for LVAD pts.  4.  Hypertension/volume: BP ok, + edema. UF as tolerated. Not on any bp meds.  5.  Anemia: Hgb pending - see above. Ongoing lower GI bleeding s/p multiple procedures. 6.  Metabolic bone disease: Labs pending. 7.  Ischemic CM (+ LVAD)/CAD: Per heart failure team. 8.  T2DM 9.  OSA 10.  Hx TAVR   Plan - HD today and again tomorrow, lower dry wt as Ronnie Derby MD [place] pager [phone]   05/01/2018, 1:31 PM   Recent Labs  Lab 04/30/18 1651 05/01/18 0425  NA  --  136  K  --  3.7  CL  --  100  CO2  --  26  GLUCOSE  --  144*  BUN  --  19  CREATININE  --  6.09*  CALCIUM  --  7.7*  INR 1.36 1.54   No results for input(s): AST, ALT, ALKPHOS, BILITOT, PROT in the last 168 hours. Recent Labs  Lab 04/30/18 1651 05/01/18 0425  WBC 16.1* 14.2*  HGB 9.7* 9.5*  HCT 32.8* 31.6*  MCV 96.5 96.0  PLT 181 147*   Iron/TIBC/Ferritin/ %Sat    Component Value Date/Time   IRON 125 06/14/2017 1605   TIBC 221 (L) 06/14/2017 1605   FERRITIN 622 (H) 06/14/2017 1605   IRONPCTSAT 57 (H) 06/14/2017 1605

## 2018-05-02 DIAGNOSIS — K284 Chronic or unspecified gastrojejunal ulcer with hemorrhage: Secondary | ICD-10-CM

## 2018-05-02 LAB — HEPATITIS B SURFACE ANTIGEN: HEP B S AG: NEGATIVE

## 2018-05-02 LAB — BASIC METABOLIC PANEL
Anion gap: 10 (ref 5–15)
BUN: 12 mg/dL (ref 8–23)
CALCIUM: 7.3 mg/dL — AB (ref 8.9–10.3)
CO2: 27 mmol/L (ref 22–32)
CREATININE: 4.61 mg/dL — AB (ref 0.61–1.24)
Chloride: 98 mmol/L (ref 98–111)
GFR calc non Af Amer: 12 mL/min — ABNORMAL LOW (ref >60)
GFR, EST AFRICAN AMERICAN: 13 mL/min — AB (ref >60)
GLUCOSE: 104 mg/dL — AB (ref 70–99)
Potassium: 3.2 mmol/L — ABNORMAL LOW (ref 3.5–5.1)
Sodium: 135 mmol/L (ref 135–145)

## 2018-05-02 LAB — PROTIME-INR
INR: 4.51
INR: 4.71
Prothrombin Time: 42.5 seconds — ABNORMAL HIGH (ref 11.4–15.2)
Prothrombin Time: 43.9 seconds — ABNORMAL HIGH (ref 11.4–15.2)

## 2018-05-02 LAB — GLUCOSE, CAPILLARY
GLUCOSE-CAPILLARY: 87 mg/dL (ref 70–99)
Glucose-Capillary: 167 mg/dL — ABNORMAL HIGH (ref 70–99)
Glucose-Capillary: 96 mg/dL (ref 70–99)
Glucose-Capillary: 118 mg/dL — ABNORMAL HIGH (ref 70–99)

## 2018-05-02 LAB — HEPARIN LEVEL (UNFRACTIONATED): HEPARIN UNFRACTIONATED: 0.3 [IU]/mL (ref 0.30–0.70)

## 2018-05-02 LAB — CBC
HCT: 30.1 % — ABNORMAL LOW (ref 39.0–52.0)
Hemoglobin: 9.1 g/dL — ABNORMAL LOW (ref 13.0–17.0)
MCH: 29 pg (ref 26.0–34.0)
MCHC: 30.2 g/dL (ref 30.0–36.0)
MCV: 95.9 fL (ref 78.0–100.0)
PLATELETS: 154 10*3/uL (ref 150–400)
RBC: 3.14 MIL/uL — AB (ref 4.22–5.81)
RDW: 20.1 % — AB (ref 11.5–15.5)
WBC: 14 10*3/uL — ABNORMAL HIGH (ref 4.0–10.5)

## 2018-05-02 LAB — MAGNESIUM: Magnesium: 2.4 mg/dL (ref 1.7–2.4)

## 2018-05-02 LAB — LACTATE DEHYDROGENASE: LDH: 277 U/L — ABNORMAL HIGH (ref 98–192)

## 2018-05-02 NOTE — Progress Notes (Signed)
Phlebotomy mistook 2nd draw as a duplicate and removed it from order. Will send a new blue top.   [place]  Keitha Kolk, RN

## 2018-05-02 NOTE — Plan of Care (Signed)
Vitals within pt's baseline; knee pain dec with meds; will go to dialysis today; will continue to monitor progress.   [place]  Alfio Loescher, RN

## 2018-05-02 NOTE — Progress Notes (Signed)
[facility] Progress Note  Subjective: no new c/o, 3.6 L off yest tolerated well, down to 78kg post hd  Vitals:   05/02/18 0634 05/02/18 0655 05/02/18 0725 05/02/18 0735  BP: 92/77  (!) 112/93   Pulse:   80   Resp:   15   Temp: 98.3 F (36.8 C)  97.9 F (36.6 C)   TempSrc: Oral  Oral   SpO2:   97% 96%  Weight:  76.6 kg (168 lb 14 oz)    Height:        Inpatient medications: . atorvastatin  40 mg Oral q1800  . budesonide  0.25 mg Nebulization BID  . busPIRone  5 mg Oral BID  . calcitRIOL  0.5 mcg Oral Q M,W,F  . Chlorhexidine Gluconate Cloth  6 each Topical Q0600  . insulin aspart  0-15 Units Subcutaneous TID WC  . insulin aspart  0-5 Units Subcutaneous QHS  . insulin aspart  3 Units Subcutaneous TID WC  . levothyroxine  25 mcg Oral QAC breakfast  . pantoprazole (PROTONIX) IV  40 mg Intravenous Q12H  . senna-docusate  1 tablet Oral BID  . Warfarin - Pharmacist Dosing Inpatient   Does not apply q1800   . sodium chloride    . sodium chloride    .  ceFAZolin (ANCEF) IV Stopped (05/01/18 2229)  . heparin 1,100 Units/hr (05/02/18 0000)   sodium chloride, sodium chloride, acetaminophen, albuterol, benzonatate, lidocaine (PF), lidocaine-prilocaine, ondansetron (ZOFRAN) IV, oxyCODONE, pentafluoroprop-tetrafluoroeth  Exam: General: Well developed, well nourished, in no acute distress. Head: Normocephalic, atraumatic, sclera non-icteric, mucus membranes are moist. Neck: Supple without lymphadenopathy/masses. JVD not elevated. Lungs: Clear bilaterally to auscultation without wheezes, rales, or rhonchi. Breathing is unlabored. Heart: RRR with normal S1, S2. No murmurs, rubs, or gallops appreciated. Abdomen: Soft, non-tender, non-distended with normoactive bowel sounds.  Musculoskeletal:  Strength and tone appear normal for age. Lower extremities: 2+ LE and LUE edema Neuro: Alert and oriented X 3. Moves all extremities spontaneously. Psych:  Responds to questions  appropriately with a normal affect. Dialysis Access: L AVF + bruit   Dialysis: [facility]  4.5h  83 kg (will be lower now)  2/2 bath  LUA AVF  Hep none      Impression: 1. Lower GI bleeding: recurrent problem; per GI and primary team. Hgb 9- 10 range 2. S/p L knee washout: septic arthritis, recent diagnosis. Looks like finishing Cefazolin this week. Will need sutures removed soon.  3.  ESRD: cont HD schedule (MWFSa). Good UF yest plan 2-3 L again today as tolerated. Lowering dry wt. Keep MAP > 60 on dialysis for LVAD pts.  4.  Hypertension/volume: BP ok, + edema. UF as tolerated 5.  Anemia: see above. Ongoing lower GI bleeding s/p multiple procedures.  6.  Metabolic bone disease: Labs pending. 7.  Ischemic CM (+ LVAD)/CAD: Per heart failure team. 8.  T2DM 9.  OSA 10.  Hx TAVR   Plan - HD again today, UF 2-3 L as tol   Kelly Splinter MD [employer] pager [phone]   05/02/2018, 11:17 AM   Recent Labs  Lab 04/30/18 1651 05/01/18 0425  NA  --  136  K  --  3.7  CL  --  100  CO2  --  26  GLUCOSE  --  144*  BUN  --  19  CREATININE  --  6.09*  CALCIUM  --  7.7*  INR 1.36 1.54   No results for input(s): AST,  ALT, ALKPHOS, BILITOT, PROT in the last 168 hours. Recent Labs  Lab 04/30/18 1651 05/01/18 0425  WBC 16.1* 14.2*  HGB 9.7* 9.5*  HCT 32.8* 31.6*  MCV 96.5 96.0  PLT 181 147*   Iron/TIBC/Ferritin/ %Sat    Component Value Date/Time   IRON 125 06/14/2017 1605   TIBC 221 (L) 06/14/2017 1605   FERRITIN 622 (H) 06/14/2017 1605   IRONPCTSAT 57 (H) 06/14/2017 1605

## 2018-05-02 NOTE — Progress Notes (Signed)
Advanced Heart Failure VAD Team Note  Subjective:     Feels better this am. Some loose stools but no melena or BRPB.   Tolerating heparin.  Had HD yesterday and now getting set up to start again today.   No CP, orthopnea or PND. Still with some pain in left knee. No fevers or chills.   LDH 297   LVAD INTERROGATION:  HeartMate-2 LVAD:   Flow 4.4 liters/min, speed 9200, power 5.0, PI 6.4.   VAD interrogated personally. Parameters stable.  Objective:    Vital Signs:   Temp:  [97.9 F (36.6 C)-98.3 F (36.8 C)] 97.9 F (36.6 C) (06/29 0725) Pulse Rate:  [68-82] 80 (06/29 0725) Resp:  [10-16] 15 (06/29 0725) BP: (87-113)/(77-93) 107/90 (06/29 1233) SpO2:  [94 %-99 %] 96 % (06/29 0735) Weight:  [76.6 kg (168 lb 14 oz)] 76.6 kg (168 lb 14 oz) (06/29 0655) Last BM Date: 05/01/18 Mean arterial Pressure 70-80s  Intake/Output:   Intake/Output Summary (Last 24 hours) at 05/02/2018 1323 Last data filed at 05/02/2018 1233 Gross per 24 hour  Intake 652.82 ml  Output 0 ml  Net 652.82 ml     Physical Exam    General:  Sitting up in bed. NAD HEENT: normal  Neck: supple. JVP to jaw.  Carotids 2+ bilat; no bruits. No lymphadenopathy or thryomegaly appreciated. Cor: LVAD hum.  Lungs: Clear. Abdomen: soft, nontender, non-distended. No hepatosplenomegaly. No bruits or masses. Good bowel sounds. Driveline site clean. Anchor in place. Surgical scars have healed well Extremities: no cyanosis, clubbing, rash. Surgical site on left knee stable. 2-3+ LLE edema. Trace-1+ edema on right  Neuro: alert & oriented x 3. No focal deficits. Moves all 4 without problem    Telemetry   AF 80s Personally reviewed   Labs   Basic Metabolic Panel: Recent Labs  Lab 05/01/18 0425  NA 136  K 3.7  CL 100  CO2 26  GLUCOSE 144*  BUN 19  CREATININE 6.09*  CALCIUM 7.7*  MG 1.5*    Liver Function Tests: No results for input(s): AST, ALT, ALKPHOS, BILITOT, PROT, ALBUMIN in the last 168  hours. No results for input(s): LIPASE, AMYLASE in the last 168 hours. No results for input(s): AMMONIA in the last 168 hours.  CBC: Recent Labs  Lab 04/30/18 1651 05/01/18 0425  WBC 16.1* 14.2*  HGB 9.7* 9.5*  HCT 32.8* 31.6*  MCV 96.5 96.0  PLT 181 147*    INR: Recent Labs  Lab 04/30/18 1651 05/01/18 0425  INR 1.36 1.54    Other results:  Imaging   No results found.   Medications:     Scheduled Medications: . atorvastatin  40 mg Oral q1800  . budesonide  0.25 mg Nebulization BID  . busPIRone  5 mg Oral BID  . calcitRIOL  0.5 mcg Oral Q M,W,F  . Chlorhexidine Gluconate Cloth  6 each Topical Q0600  . insulin aspart  0-15 Units Subcutaneous TID WC  . insulin aspart  0-5 Units Subcutaneous QHS  . insulin aspart  3 Units Subcutaneous TID WC  . levothyroxine  25 mcg Oral QAC breakfast  . pantoprazole (PROTONIX) IV  40 mg Intravenous Q12H  . senna-docusate  1 tablet Oral BID  . Warfarin - Pharmacist Dosing Inpatient   Does not apply q1800    Infusions: . sodium chloride    . sodium chloride    .  ceFAZolin (ANCEF) IV Stopped (05/01/18 2229)  . heparin 1,100 Units/hr (05/02/18 0000)  PRN Medications: sodium chloride, sodium chloride, acetaminophen, albuterol, benzonatate, lidocaine (PF), lidocaine-prilocaine, ondansetron (ZOFRAN) IV, oxyCODONE, pentafluoroprop-tetrafluoroeth   Patient Profile   Trevor [name] a 72 y.o.malewith a history of CAD s/p CABG x 4 2010, OSA, AS with TAVR 2015, ,ESRD on HD,DM2,chronicatrial fibrillation, asthma,GI bleed,and ischemic cardiomyopathy with Medtronic ICD.S/PHMII LVAD for Destination therapy on 12/14/2015.  Transferred back from [place] on 6/27 after undergoing enterotomy and clipping of a colonic Dueilafoy lesion as well I&D of septic left knee.   Assessment/Plan:    1. GI Bleed/Symptomatic Anemia: GI AVMs. Double balloon retro endoscopy at [facility], unable to identify bleeding lesion. He failed attempted IR  embolization x 3 while at [facility]. 12 units PRBCs at [facility]. Received 15 uPRBC while at [facility]. IR consulted, but did not find any active bleeding on CTA 5/20. Repeat capsule study showed active bleeding in the mid ileum. Surgery was reconsulted and recommended transfer back to [facility] for possible surgery.  - Now s/p laparoscopic surgical enteroscopy with 4 clips on 6/7 at [facility] - C/b ischemic bowel/SBO. Now tolerating full diet - Heparin/coumadin restarted 6/27. No evidence of bleeding currently. Discussed dosing with PharmD personally. - Pending labs from this am.   2. 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 S/p HM II LVAD placed 2017 for DT. Has Medtronic ICD.  - Volume status elevated. Getting HD again today - VAD interrogated personally. Parameters stable. - Volume managed by HD.  - No bb with RV failure.  - Continue sildenafil 40 mg tid.  - INR goal 1.8-2.2 INR 1.54 today. Discussed dosing with PharmD personally.  3. VAD, HM-II - VAD interrogated personally. Parameters stable. - LDH pending - INR 1.54 (Goal 1.8-2.2) Continue heparin and warfarin. Stop heparin when INR >= 1.8 - No ASA with GIB  4. ESRD- M/W/F/Sat.  - HD per Renal. Getting dialyzed again today  - Appreciate Renal's help  5. Chronic A fib: - Rate controlled. - As above, continue heparin and coumadin  6. HTN:  - MAPs 70-80  7. Septic left knee - s/p several aspirations and OR wash out x2. Aspirate cultures positive for staph epi and clostridium perfinge. Blood cultures negative at [facility].  - On Ancef through 6/28 - Ortho saw 6/28  Stitches out early next week  8. DM II - SSI  9. Hx of CAD s/p CABG. - No s/s ischemia. - Continue statin  9. Hx of TAVR:  - Valve stable on last echo 12/2017  10. Deconditioning - PT/OT  & CIR are following - Will likely need CIR  11. Hypomagnesemia - supped yesterday. Will recheck   I reviewed the LVAD parameters from today, and compared the results  to the patient's prior recorded data.  No programming changes were made.  The LVAD is functioning within specified parameters.  The patient performs LVAD self-test daily.  LVAD interrogation was negative for any significant power changes, alarms or PI events/speed drops.  LVAD equipment check completed and is in good working order.  Back-up equipment present.   LVAD education done on emergency procedures and precautions and reviewed exit site care.  Length of Stay: 2  Glori Bickers, MD 05/02/2018, 1:23 PM  VAD Team --- VAD ISSUES ONLY--- Pager [phone] (7am - 7am)  Advanced Heart Failure Team  Pager [phone] (M-F; 7a - 4p)  Please contact [facility] for night-coverage after hours (4p -7a ) and weekends on [email or web address]

## 2018-05-02 NOTE — Progress Notes (Signed)
MEDICATION RELATED NOTE   Pharmacy Re:  Home Meds  Patient has been admitted after a stay at [facility].  We have attempted to clarify his home meds as best as possible.  Please review closely and resume only those you feel appropriate for his care.  Plan:  Will mark his med-hx. as complete, please let us know if we need to update should new information become available.   Rober Minion, PharmD., MS Clinical Pharmacist Pager:  [phone] Thank you for allowing pharmacy to be part of this patients care team. 05/02/2018,3:54 PM

## 2018-05-02 NOTE — Progress Notes (Signed)
ANTICOAGULATION CONSULT NOTE  Pharmacy Consult for heparin/warfarin Indication: LVAD  Patient Measurements: Height: 5\' 5"  (165.1 cm) Weight: 167 lb 8.8 oz (76 kg) IBW/kg (Calculated) : 61.5 Heparin Dosing Weight: 79.5 kg  Vital Signs: Temp: 97.5 F (36.4 C) (06/29 1753) Temp Source: Oral (06/29 1753) BP: 107/88 (06/29 1753) Pulse Rate: 81 (06/29 1753)  Labs: Recent Labs    04/30/18 1651  05/01/18 0425 05/01/18 1338 05/01/18 1824 05/02/18 1346 05/02/18 1704  HGB 9.7*  --  9.5*  --   --  9.1*  --   HCT 32.8*  --  31.6*  --   --  30.1*  --   PLT 181  --  147*  --   --  154  --   LABPROT 16.7*  --  18.3*  --   --  42.5* 43.9*  INR 1.36  --  1.54  --   --  4.51* 4.71*  HEPARINUNFRC  --    < > 0.12* >2.20* 0.43 0.30  --   CREATININE  --   --  6.09*  --   --  4.61*  --    < > = values in this interval not displayed.    Medical History: Past Medical History:  Diagnosis Date  . AICD (automatic cardioverter/defibrillator) present   . Asthma   . AVM (arteriovenous malformation) of colon 07/07/2017  . CHF (congestive heart failure) ([place])   . Diabetes ([place])   . ESRF (end stage renal failure) ([place]) 07/07/2017   pt receiving hemodialysis Monday- Wednesday-friday, sometimes Saturday  . Kidney disease   . LVAD (left ventricular assist device) present ([place]) 12/2016  . Permanent atrial fibrillation ([place]) 07/07/2017  . Presence of permanent cardiac pacemaker    AICD  . Sleep apnea     Assessment: 54 yom who underwent HM2 LVAD implantation on 12/14/2015. Has undergoing extensive GI workup to evaluate bleeding. Was stopped on anticoagulants as of admission on 03/09/2018. Last outpatient regimen was (as of 02/17/2018): 7.5 mg daily except 10 mg on Tues/Thurs for goal 2-2.5. Goal INR was decreased to 1.8-2.3 after admission on 02/28/2018. Now s/p laparoscopic surgical enteroscopy with 4 clips on 04/10/2018.  Heparin and warfarin were resumed on 6/27. Patient has only received two doses of  warfarin. Unexpectedly, INR is now up to 4.7.   Goal of Therapy:  Heparin level: 0.3-0.5 INR goal: 1.8-2.2 Monitor platelets by anticoagulation protocol: Yes   Plan:  -Hold heparin + warfarin per RN discussion with MD   Harvel Quale 05/02/2018 7:19 PM

## 2018-05-02 NOTE — Progress Notes (Signed)
3rd blue tube sent for PT/INR.   [place]  Maritza Hosterman, RN

## 2018-05-02 NOTE — Progress Notes (Signed)
Bensimhon ordered Heparin gtt to be turned off; holding tonight's warfarin dose. Will continue to monitor.   [place]  Tawnia Schirm, RN

## 2018-05-02 NOTE — Progress Notes (Signed)
CRITICAL VALUE ALERT  Critical Value:  INR 4.7  Date & Time Notied:  6:36  Provider Notified: 6:37  Orders Received/Actions taken: .Marland KitchenMarland Kitchen

## 2018-05-02 NOTE — Progress Notes (Signed)
2nd blue top sent to lab as soon as first INR resulted earlier. 2nd not yet resulted so contacted lab. Will send again if necessary.   [place]  Sherran Margolis, RN

## 2018-05-02 NOTE — Progress Notes (Signed)
Inpatient Rehabilitation Admissions Coordinator  I await therapy evaluations to begin planning possible dispo options. I will follow up on Monday. I am familiar with patient from chart reviews and discussions with [name] concerning a possible inpt rehab admission.   Danne Baxter, RN, MSN Rehab Admissions Coordinator [phone] 05/02/2018 1:19 PM

## 2018-05-02 NOTE — Progress Notes (Signed)
PT Cancellation Note  Patient Details Name: Trevor [name] MRN: [id number] DOB: Jul 20, 1946   Cancelled Treatment:    Reason Eval/Treat Not Completed: Medical issues which prohibited therapy;Patient not medically ready(getting in room HD)   Duncan Dull 05/02/2018, 2:16 PM

## 2018-05-03 DIAGNOSIS — I5023 Acute on chronic systolic (congestive) heart failure: Secondary | ICD-10-CM

## 2018-05-03 DIAGNOSIS — D649 Anemia, unspecified: Secondary | ICD-10-CM

## 2018-05-03 LAB — BASIC METABOLIC PANEL
Anion gap: 10 (ref 5–15)
BUN: 7 mg/dL — AB (ref 8–23)
CHLORIDE: 96 mmol/L — AB (ref 98–111)
CO2: 30 mmol/L (ref 22–32)
CREATININE: 3.33 mg/dL — AB (ref 0.61–1.24)
Calcium: 7.5 mg/dL — ABNORMAL LOW (ref 8.9–10.3)
GFR calc Af Amer: 20 mL/min — ABNORMAL LOW (ref >60)
GFR, EST NON AFRICAN AMERICAN: 17 mL/min — AB (ref >60)
GLUCOSE: 169 mg/dL — AB (ref 70–99)
Potassium: 4 mmol/L (ref 3.5–5.1)
Sodium: 136 mmol/L (ref 135–145)

## 2018-05-03 LAB — GLUCOSE, CAPILLARY
GLUCOSE-CAPILLARY: 143 mg/dL — AB (ref 70–99)
GLUCOSE-CAPILLARY: 51 mg/dL — AB (ref 70–99)
GLUCOSE-CAPILLARY: 59 mg/dL — AB (ref 70–99)
Glucose-Capillary: 154 mg/dL — ABNORMAL HIGH (ref 70–99)
Glucose-Capillary: 109 mg/dL — ABNORMAL HIGH (ref 70–99)
Glucose-Capillary: 162 mg/dL — ABNORMAL HIGH (ref 70–99)

## 2018-05-03 LAB — PROTIME-INR
INR: 3.08
Prothrombin Time: 31.5 seconds — ABNORMAL HIGH (ref 11.4–15.2)

## 2018-05-03 LAB — LACTATE DEHYDROGENASE: LDH: 282 U/L — ABNORMAL HIGH (ref 98–192)

## 2018-05-03 LAB — CBC
HEMATOCRIT: 32.5 % — AB (ref 39.0–52.0)
Hemoglobin: 9.7 g/dL — ABNORMAL LOW (ref 13.0–17.0)
MCH: 28.7 pg (ref 26.0–34.0)
MCHC: 29.8 g/dL — AB (ref 30.0–36.0)
MCV: 96.2 fL (ref 78.0–100.0)
Platelets: 175 10*3/uL (ref 150–400)
RBC: 3.38 MIL/uL — ABNORMAL LOW (ref 4.22–5.81)
RDW: 20.3 % — AB (ref 11.5–15.5)
WBC: 13.9 10*3/uL — ABNORMAL HIGH (ref 4.0–10.5)

## 2018-05-03 MED ORDER — ENSURE ENLIVE PO LIQD
237.0000 mL | Freq: Once | ORAL | Status: AC
  Administered 2018-05-03: 237 mL via ORAL

## 2018-05-03 MED ORDER — DARBEPOETIN ALFA 100 MCG/0.5ML IJ SOSY
100.0000 ug | PREFILLED_SYRINGE | INTRAMUSCULAR | Status: DC
  Administered 2018-05-04: 100 ug via INTRAVENOUS
  Filled 2018-05-03: qty 0.5

## 2018-05-03 MED ORDER — TRAMADOL HCL 50 MG PO TABS
100.0000 mg | ORAL_TABLET | Freq: Three times a day (TID) | ORAL | Status: DC | PRN
  Administered 2018-05-03 – 2018-05-04 (×3): 100 mg via ORAL
  Filled 2018-05-03 (×3): qty 2

## 2018-05-03 MED ORDER — SILDENAFIL CITRATE 20 MG PO TABS
20.0000 mg | ORAL_TABLET | Freq: Three times a day (TID) | ORAL | Status: DC
  Administered 2018-05-03 – 2018-05-05 (×7): 20 mg via ORAL
  Filled 2018-05-03 (×7): qty 1

## 2018-05-03 NOTE — Evaluation (Signed)
Occupational Therapy Evaluation Patient Details Name: Trevor [name] MRN: [id number] DOB: 11-05-1945 Today's Date: 05/03/2018    History of Present Illness Trevor [name] is a 72 y.o. male with complex GI history with multiple transitions to [facility] and [facility] for management of GIB since May as well as development of Left knee infection s/p aspiration. PMHx: CAD s/p CABG x 4 2010, OSA, AS with TAVR 2015 , ESRD on HD, DM2, chronic Afib, asthma, ICM with Medtronic ICD.  S/P HMII LVAD for Destination therapy on 12/13/2016.    Clinical Impression   Pt admitted with above. He demonstrates the below listed deficits and will benefit from continued OT to maximize safety and independence with BADLs.  Pt presents to OT with generalized weakness, decreased activity tolerance, and pain in Lt knee.  He currently requires mod A for LB ADLs, and min guard assist for functional transfers.  PTA, he was able to perform ADLs independently.  He lives with his sister.  Recommend CIR.       Follow Up Recommendations  CIR;Supervision/Assistance - 24 hour    Equipment Recommendations  None recommended by OT    Recommendations for Other Services       Precautions / Restrictions Precautions Precautions: Other (comment);Fall Precaution Comments: LVAD Other Brace/Splint: Dr Hewitt's note 5/23 mentions hinged knee brace due to be worn when WBing due to possible Meniscus injury.  at time of eval on 6/30, there are no orders for the brace and nothing mentioned in ortho follow up notes.   Pt was also seen by ortho at [facility] in the interime, per the notes       Mobility Bed Mobility               General bed mobility comments: pt up in chair upon PT arrival  Transfers Overall transfer level: Needs assistance Equipment used: Rolling walker (2 wheeled);4-wheeled walker Transfers: Sit to/from [employer] to Stand: Min guard Stand pivot transfers: Min guard       General transfer comment:  verbal cues for hand placement     Balance Overall balance assessment: Needs assistance Sitting-balance support: Feet supported Sitting balance-[name]: Good     Standing balance support: Bilateral upper extremity supported Standing balance-[name]: Poor Standing balance comment: reliant on UE support                            ADL either performed or assessed with clinical judgement   ADL Overall ADL's : Needs assistance/impaired Eating/Feeding: Independent   Grooming: Wash/dry hands;Wash/dry face;Oral care;Brushing hair;Set up;Sitting   Upper Body Bathing: Set up;Supervision/ safety;Sitting   Lower Body Bathing: Moderate assistance;Sit to/from stand Lower Body Bathing Details (indicate cue type and reason): unable to access Lt foot  Upper Body Dressing : Set up;Sitting   Lower Body Dressing: Moderate assistance;Sit to/from stand Lower Body Dressing Details (indicate cue type and reason): unable to access Lt foot  Toilet Transfer: Min guard;Stand-pivot;BSC;RW;Ambulation;Comfort height toilet   Toileting- Clothing Manipulation and Hygiene: Minimal assistance;Sit to/from stand       Functional mobility during ADLs: Surveyor, minerals     Praxis      Pertinent Vitals/Pain Pain Assessment: Faces Faces Pain Scale: Hurts even more Pain Location: Lt knee with WBing and movement  Pain Descriptors / Indicators: Aching;Grimacing;Guarding;Radiating Pain Intervention(s): Monitored during session  Hand Dominance Right   Extremity/Trunk Assessment Upper Extremity Assessment Upper Extremity Assessment: Overall WFL for tasks assessed   Lower Extremity Assessment Lower Extremity Assessment: Defer to PT evaluation   Cervical / Trunk Assessment Cervical / Trunk Assessment: Normal   Communication Communication Communication: No difficulties   Cognition Arousal/Alertness: Awake/alert Behavior During Therapy:  WFL for tasks assessed/performed                                       General Comments  VSS     Exercises     Shoulder Instructions      Home Living Family/patient expects to be discharged to:: Private residence Living Arrangements: Other relatives Available Help at Discharge: Family;Available 24 hours/day Type of Home: Other(Comment)(condo ) Home Access: Level entry     Home Layout: One level     Bathroom Shower/Tub: Teacher, early years/pre: Standard Bathroom Accessibility: No   Home Equipment: Shower seat          Prior Functioning/Environment Level of Independence: Independent        Comments: pt indep with driving, ADLs, and mobility. no AD needed        OT Problem List: Decreased strength;Decreased activity tolerance;Impaired balance (sitting and/or standing);Decreased knowledge of use of DME or AE;Pain      OT Treatment/Interventions: Self-care/ADL training;DME and/or AE instruction;Therapeutic activities;Patient/family education;Balance training    OT Goals(Current goals can be found in the care plan section) Acute Rehab OT Goals Patient Stated Goal: tto go fishing  OT Goal Formulation: With patient Time For Goal Achievement: 05/17/18 Potential to Achieve Goals: Good ADL Goals Pt Will Perform Grooming: with min guard assist;standing Pt Will Perform Lower Body Bathing: with min guard assist;sit to/from stand Pt Will Perform Lower Body Dressing: with min guard assist;sit to/from stand Pt Will Transfer to Toilet: with min guard assist;ambulating;regular height toilet;grab bars Pt Will Perform Toileting - Clothing Manipulation and hygiene: with min guard assist;sit to/from stand  OT Frequency: Min 2X/week   Barriers to D/C:            Co-evaluation PT/OT/SLP Co-Evaluation/Treatment: Yes Reason for Co-Treatment: For patient/therapist safety;To address functional/ADL transfers   OT goals addressed during session: ADL's  and self-care      AM-PAC PT "6 Clicks" Daily Activity     Outcome Measure Help from another person eating meals?: None Help from another person taking care of personal grooming?: A Little Help from another person toileting, which includes using toliet, bedpan, or urinal?: A Little Help from another person bathing (including washing, rinsing, drying)?: A Lot Help from another person to put on and taking off regular upper body clothing?: A Little Help from another person to put on and taking off regular lower body clothing?: A Lot 6 Click Score: 17   End of Session Equipment Utilized During Treatment: Rolling walker Nurse Communication: Mobility status  Activity Tolerance: Patient limited by pain Patient left: in chair;with call bell/phone within reach  OT Visit Diagnosis: Pain Pain - Right/Left: Left Pain - part of body: Knee                Time: 0263-7858 OT Time Calculation (min): 40 min Charges:  OT General Charges $OT Visit: 1 Visit OT Evaluation $OT Eval Moderate Complexity: 1 Mod G-Codes:     [facility], OTR/L [phone]   Ladonna Snide, Tulani Kidney M 05/03/2018, 1:14 PM

## 2018-05-03 NOTE — Progress Notes (Signed)
BG 109.   [place]  Vianney Kopecky, RN

## 2018-05-03 NOTE — Evaluation (Addendum)
Physical Therapy Evaluation Patient Details Name: ELHADJ GIRTON MRN: [id number] DOB: October 01, 1946 Today's Date: 05/03/2018   History of Present Illness  VOLNEY REIERSON is a 72 y.o. male with complex GI history with multiple transitions to [facility] and [facility] for management of GIB since May as well as development of Left knee infection s/p aspiration. PMHx: CAD s/p CABG x 4 2010, OSA, AS with TAVR 2015 , ESRD on HD, DM2, chronic Afib, asthma, ICM with Medtronic ICD.  S/P HMII LVAD for Destination therapy on 12/13/2016.   Clinical Impression  Orders received for PT evaluation. Patient demonstrates deficits in functional mobility as indicated below. Will benefit from continued skilled PT to address deficits and maximize function. Will see as indicated and progress as tolerated.  OF NOTE: patient with noted LLE knee pain due to orthopedic issues, cued for ROM and sequencing when mobilizing. Feel patient will benefit from comprehensive therapies given extensive level of deconditioning related to current co morbidities.    Follow Up Recommendations CIR    Equipment Recommendations       Recommendations for Other Services       Precautions / Restrictions Precautions Precautions: Other (comment);Fall Precaution Comments: LVAD Other Brace/Splint: Dr Hewitt's note 5/23 mentions hinged knee brace due to be worn when WBing due to possible Meniscus injury.  at time of eval on 6/30, there are no orders for the brace and nothing mentioned in ortho follow up notes.   Pt was also seen by ortho at [facility] in the interime, per the notes       Mobility  Bed Mobility               General bed mobility comments: pt up in chair upon PT arrival  Transfers Overall transfer level: Needs assistance Equipment used: Rolling walker (2 wheeled);4-wheeled walker Transfers: Sit to/from [facility] Sit to Stand: Min guard Stand pivot transfers: Min guard       General transfer comment: verbal  cues for hand placement   Ambulation/Gait Ambulation/Gait assistance: Min guard;Mod assist Gait Distance (Feet): 30 Feet(Moderate assist for 5 ft without AD) Assistive device: Rolling walker (2 wheeled) Gait Pattern/deviations: Step-through pattern;Decreased stride length;Decreased stance time - left Gait velocity: decreased Gait velocity interpretation: <1.31 ft/sec, indicative of household ambulator General Gait Details: VCs for positioning and cadence. increased time and effort during mobility. Noted increased pain in Left knee with activity  Stairs            Wheelchair Mobility    Modified Rankin (Stroke Patients Only)       Balance Overall balance assessment: Needs assistance Sitting-balance support: Feet supported Sitting balance-[name]: Good     Standing balance support: Bilateral upper extremity supported Standing balance-[name]: Poor Standing balance comment: reliant on UE support                              Pertinent Vitals/Pain Pain Assessment: Faces Faces Pain Scale: Hurts even more Pain Location: Lt knee with WBing and movement  Pain Descriptors / Indicators: Aching;Grimacing;Guarding;Radiating Pain Intervention(s): Monitored during session    Home Living Family/patient expects to be discharged to:: Private residence Living Arrangements: Other relatives Available Help at Discharge: Family;Available 24 hours/day Type of Home: Other(Comment)(condo ) Home Access: Level entry     Home Layout: One level Home Equipment: Shower seat      Prior Function Level of Independence: Independent  Comments: pt indep with driving, ADLs, and mobility. no AD needed     Hand Dominance   Dominant Hand: Right    Extremity/Trunk Assessment   Upper Extremity Assessment Upper Extremity Assessment: Overall WFL for tasks assessed    Lower Extremity Assessment Lower Extremity Assessment: LLE deficits/detail LLE Deficits /  Details: limited ROM dur to increased LE edema LLE: Unable to fully assess due to pain LLE Coordination: decreased fine motor;decreased gross motor    Cervical / Trunk Assessment Cervical / Trunk Assessment: Normal  Communication   Communication: No difficulties  Cognition Arousal/Alertness: Awake/alert Behavior During Therapy: WFL for tasks assessed/performed Overall Cognitive Status: Within Functional Limits for tasks assessed                                        General Comments General comments (skin integrity, edema, etc.): VSS     Exercises General Exercises - Lower Extremity Ankle Circles/Pumps: AROM;Both;10 reps;Seated(with LEs elevated. Pt given slow stretch and fast pumping) Long Arc Quad: AROM;Left;10 reps   Assessment/Plan    PT Assessment Patient needs continued PT services  PT Problem List Decreased strength;Decreased range of motion;Decreased activity tolerance;Decreased balance;Decreased mobility;Decreased knowledge of use of DME;Pain;Cardiopulmonary status limiting activity       PT Treatment Interventions DME instruction;Gait training;Stair training;Functional mobility training;Therapeutic activities;Therapeutic exercise;Balance training;Neuromuscular re-education    PT Goals (Current goals can be found in the Care Plan section)  Acute Rehab PT Goals Patient Stated Goal: tto go fishing  PT Goal Formulation: With patient Time For Goal Achievement: 05/17/18 Potential to Achieve Goals: Good    Frequency Min 3X/week   Barriers to discharge        Co-evaluation PT/OT/SLP Co-Evaluation/Treatment: Yes Reason for Co-Treatment: For patient/therapist safety;To address functional/ADL transfers PT goals addressed during session: Mobility/safety with mobility OT goals addressed during session: ADL's and self-care       AM-PAC PT "6 Clicks" Daily Activity  Outcome Measure Difficulty turning over in bed (including adjusting bedclothes,  sheets and blankets)?: A Little Difficulty moving from lying on back to sitting on the side of the bed? : A Little Difficulty sitting down on and standing up from a chair with arms (e.g., wheelchair, bedside commode, etc,.)?: A Little Help needed moving to and from a bed to chair (including a wheelchair)?: A Little Help needed walking in hospital room?: A Little Help needed climbing 3-5 steps with a railing? : A Little 6 Click Score: 18    End of Session Equipment Utilized During Treatment: Gait belt Activity Tolerance: Patient tolerated treatment well Patient left: in chair;with call bell/phone within reach Nurse Communication: Mobility status PT Visit Diagnosis: Muscle weakness (generalized) (M62.81);Other abnormalities of gait and mobility (R26.89)    Time: 1610-9604 PT Time Calculation (min) (ACUTE ONLY): 23 min   Charges:   PT Evaluation $PT Eval Moderate Complexity: 1 Mod     PT G Codes:        Alben Deeds, PT DPT  Board Certified Neurologic Specialist [phone]   Duncan Dull 05/03/2018, 1:53 PM

## 2018-05-03 NOTE — Progress Notes (Signed)
CRITICAL VALUE ALERT  Critical Value:  CBG 51  Date & Time Notied:  1709  Provider Notified: No  Orders Received/Actions taken: OJ x2 given; will recheck BG in 33mins

## 2018-05-03 NOTE — Progress Notes (Signed)
BG 59; given 1 & 1/2 OJ with 2 packets of sugar mixed in (per pt request as he says he does this at home so as not to go over his fluid restriction). Will check a 3rd time in 15 mins.   [place]  Zahki Hoogendoorn, RN

## 2018-05-03 NOTE — Progress Notes (Addendum)
Advanced Heart Failure VAD Team Note  Subjective:     Doing well. Had HD yesterday with 3L out.  Weight down 8 more pounds.   INR remains very high (4.7). Heparin off. No bleeding. Small BM  Left knee ok. Doing exercises in bed. No SOB, orthopnea or PND   LDH 277 Hgb 9.5-> 9.1     LVAD INTERROGATION:  HeartMate-2 LVAD:   Flow 4.2 liters/min, speed 9200, power 4.8, PI 6.2   VAD interrogated personally. Parameters stable.  Objective:    Vital Signs:   Temp:  [97.5 F (36.4 C)-98.3 F (36.8 C)] 98.2 F (36.8 C) (06/30 0604) Pulse Rate:  [56-87] 84 (06/30 0604) Resp:  [10-29] 11 (06/30 0604) BP: (80-136)/(39-94) 93/67 (06/30 0729) SpO2:  [95 %-100 %] 95 % (06/30 0745) Weight:  [76 kg (167 lb 8.8 oz)-79.4 kg (175 lb 0.7 oz)] 76 kg (167 lb 8.8 oz) (06/29 1753) Last BM Date: 04/30/18 Mean arterial Pressure 70-80s  Intake/Output:   Intake/Output Summary (Last 24 hours) at 05/03/2018 0824 Last data filed at 05/02/2018 2207 Gross per 24 hour  Intake 624.88 ml  Output 3000 ml  Net -2375.12 ml     Physical Exam    General:  NAD. Sitting in chair HEENT: normal  Neck: supple. JVP 8 Carotids 2+ bilat; no bruits. No lymphadenopathy or thryomegaly appreciated. Cor: LVAD hum.  Lungs: Clear. Abdomen: soft, nontender, non-distended. No hepatosplenomegaly. No bruits or masses. Good bowel sounds. Driveline site clean. Anchor in place.  Extremities: no cyanosis, clubbing, rash. Warm 1+ edema  L knee surgical site ok  Neuro: alert & oriented x 3. No focal deficits. Moves all 4 without problem     Telemetry   AF 70s Personally reviewed   Labs   Basic Metabolic Panel: Recent Labs  Lab 05/01/18 0425 05/02/18 1346  NA 136 135  K 3.7 3.2*  CL 100 98  CO2 26 27  GLUCOSE 144* 104*  BUN 19 12  CREATININE 6.09* 4.61*  CALCIUM 7.7* 7.3*  MG 1.5* 2.4    Liver Function Tests: No results for input(s): AST, ALT, ALKPHOS, BILITOT, PROT, ALBUMIN in the last 168 hours. No  results for input(s): LIPASE, AMYLASE in the last 168 hours. No results for input(s): AMMONIA in the last 168 hours.  CBC: Recent Labs  Lab 04/30/18 1651 05/01/18 0425 05/02/18 1346  WBC 16.1* 14.2* 14.0*  HGB 9.7* 9.5* 9.1*  HCT 32.8* 31.6* 30.1*  MCV 96.5 96.0 95.9  PLT 181 147* 154    INR: Recent Labs  Lab 04/30/18 1651 05/01/18 0425 05/02/18 1346 05/02/18 1704  INR 1.36 1.54 4.51* 4.71*    Other results:  Imaging   No results found.   Medications:     Scheduled Medications: . atorvastatin  40 mg Oral q1800  . budesonide  0.25 mg Nebulization BID  . busPIRone  5 mg Oral BID  . calcitRIOL  0.5 mcg Oral Q M,W,F  . Chlorhexidine Gluconate Cloth  6 each Topical Q0600  . insulin aspart  0-15 Units Subcutaneous TID WC  . insulin aspart  0-5 Units Subcutaneous QHS  . insulin aspart  3 Units Subcutaneous TID WC  . levothyroxine  25 mcg Oral QAC breakfast  . pantoprazole (PROTONIX) IV  40 mg Intravenous Q12H  . senna-docusate  1 tablet Oral BID  . Warfarin - Pharmacist Dosing Inpatient   Does not apply q1800    Infusions: . sodium chloride    . sodium chloride    .  ceFAZolin (ANCEF) IV 1 g (05/02/18 2207)    PRN Medications: sodium chloride, sodium chloride, acetaminophen, albuterol, benzonatate, lidocaine (PF), lidocaine-prilocaine, ondansetron (ZOFRAN) IV, oxyCODONE, pentafluoroprop-tetrafluoroeth   Patient Profile   Trevor Amores Murphyis a 72 y.o.malewith a history of CAD s/p CABG x 4 2010, OSA, AS with TAVR 2015, ,ESRD on HD,DM2,chronicatrial fibrillation, asthma,GI bleed,and ischemic cardiomyopathy with Medtronic ICD.S/PHMII LVAD for Destination therapy on 12/14/2015.  Transferred back from [place] on 6/27 after undergoing enterotomy and clipping of a colonic Dueilafoy lesion as well I&D of septic left knee.   Assessment/Plan:    1. GI Bleed/Symptomatic Anemia: GI AVMs. Double balloon retro endoscopy at [facility], unable to identify bleeding  lesion. He failed attempted IR embolization x 3 while at [facility]. 12 units PRBCs at [facility]. Received 15 uPRBC while at [facility]. IR consulted, but did not find any active bleeding on CTA 5/20. Repeat capsule study showed active bleeding in the mid ileum. Surgery was reconsulted and recommended transfer back to [facility] for possible surgery.  - Now s/p laparoscopic surgical enteroscopy with 4 clips on 6/7 at [facility] - C/b ischemic bowel/SBO. Now tolerating full diet - Heparin/coumadin restarted 6/27. INR at 3.1. No bleeding. Heparin off. Will encourage him to drink some Ensure today with vitamin K. Discussed dosing with PharmD personally. - Hgb stable at 9.7  2. 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 S/p HM II LVAD placed 2017 for DT. Has Medtronic ICD.  - Volume status much improved after 2 sessions of HD. Resume MWF schedule - VAD interrogated personally. Parameters stable. - Volume managed by HD.  - No bb with RV failure.  - Continue sildenafil 40 mg tid.   3. VAD, HM-II - VAD interrogated personally. Parameters stable. - LDH 282 - INR 3.1 (Goal 1.8-2.2) see baove - No ASA with GIB  4. ESRD- M/W/F/Sat.  - HD per Renal. Has had 2 sessions in past 2 days. Switch back to MWF schedule - Appreciate Renal's help  5. Chronic A fib: - Rate controlled. - As above, continue heparin and coumadin  6. HTN:  - MAPs 70-80  7. Septic left knee - s/p several aspirations and OR wash out x2. Aspirate cultures positive for staph epi and clostridium perfinge. Blood cultures negative at [facility].  - On Ancef through 6/28 - Ortho saw 6/28  Stitches out early this week  8. DM II - SSI  9. Hx of CAD s/p CABG. - No s/s ischemia. - Continue statin  9. Hx of TAVR:  - Valve stable on last echo 12/2017  10. Deconditioning - PT/OT  & CIR are following - Will likely need CIR  11. Hypomagnesemia - supped yesterday. 2.4 today  I reviewed the LVAD parameters from today, and compared the  results to the patient's prior recorded data.  No programming changes were made.  The LVAD is functioning within specified parameters.  The patient performs LVAD self-test daily.  LVAD interrogation was negative for any significant power changes, alarms or PI events/speed drops.  LVAD equipment check completed and is in good working order.  Back-up equipment present.   LVAD education done on emergency procedures and precautions and reviewed exit site care.  Length of Stay: 3  Glori Bickers, MD 05/03/2018, 8:24 AM  VAD Team --- VAD ISSUES ONLY--- Pager [phone] (7am - 7am)  Advanced Heart Failure Team  Pager [phone] (M-F; 7a - 4p)  Please contact [facility] for night-coverage after hours (4p -7a ) and weekends on [email or web address]

## 2018-05-03 NOTE — Progress Notes (Signed)
Pt wanted to set up times for sleep between 10pm and 6am.  Saunders Revel T

## 2018-05-03 NOTE — Progress Notes (Addendum)
Subjective:  Sitting in chair eating lunch , no sob, mild L knee discomfort   Objective Vital signs in last 24 hours: Vitals:   05/03/18 0604 05/03/18 0729 05/03/18 0745 05/03/18 1151  BP: (!) 81/54 93/67  103/85  Pulse: 84     Resp: 11     Temp: 98.2 F (36.8 C)     TempSrc: Oral     SpO2: 96%  95%   Weight:      Height:       Weight change: -1.295 kg (-2 lb 13.7 oz)  Physical Exam: General: Alert NAD  Heart: RRR with   LVAD  Hum, no rub   Lungs:CTA , nonlabored  breathing  Abdomen: soft , NT, ND  Extremities: L knee sutures in tact  Clean / dry / edema 1+ bilat le  Dialysis Access: LUA AVF  Pos bruit   Dialysis: [facility]at [facility]  4.5h  83 kg (will be lower now)  2/2 bath  LUA AVF  Hep none  Problem/Plan: 1.  Lower GI bleeding: recurrent problem; per GI admit tream  hgb stable Hgb 9- 10 range 2. S/p L knee washout: septic arthritis, recent diagnosis. Looks like finishing Cefazolin this week. Ortho plans / then prob.  rehab per Pt. 3. ESRD: cont HD schedule (MWFSa).  4. Hypertension/volume: BP ok, + edema.UF yest -3 L and tolerated / tomor  again as tolerated. Lowering dry wt 5.  UF as tolerated 6. Anemia of ESRD and GI bld :.9.7 hgb  Ongoing lower GI bleeding s/p multiple procedures. No esa, start  100 Aranesp with  q mon hd  7. Metabolic bone disease: po vit d  on hd , no current binder fu phos pre hd in am . And no recent  pth (last  02/11/18 l op labs 280 ) pth with pre hd labs in am   8. Ischemic CM (+ LVAD)/CAD: Per heart failure team. 9. T2DM 10. OSA 11. Hx TAVR  Ernest Haber, PA-C [place] [phone] 05/03/2018,2:12 PM  LOS: 3 days   Pt seen, examined and agree w A/P as above.  Kelly Splinter MD [employer] pager [phone]   05/03/2018, 3:32 PM    Labs: Basic Metabolic Panel: Recent Labs  Lab 05/01/18 0425 05/02/18 1346 05/03/18 0804  NA 136 135 136  K 3.7 3.2* 4.0  CL 100 98 96*  CO2 26 27 30   GLUCOSE  144* 104* 169*  BUN 19 12 7*  CREATININE 6.09* 4.61* 3.33*  CALCIUM 7.7* 7.3* 7.5*   Liver Function Tests: No results for input(s): AST, ALT, ALKPHOS, BILITOT, PROT, ALBUMIN in the last 168 hours. No results for input(s): LIPASE, AMYLASE in the last 168 hours. No results for input(s): AMMONIA in the last 168 hours. CBC: Recent Labs  Lab 04/30/18 1651 05/01/18 0425 05/02/18 1346 05/03/18 0804  WBC 16.1* 14.2* 14.0* 13.9*  HGB 9.7* 9.5* 9.1* 9.7*  HCT 32.8* 31.6* 30.1* 32.5*  MCV 96.5 96.0 95.9 96.2  PLT 181 147* 154 175   Cardiac Enzymes: No results for input(s): CKTOTAL, CKMB, CKMBINDEX, TROPONINI in the last 168 hours. CBG: Recent Labs  Lab 05/02/18 1233 05/02/18 1708 05/02/18 2104 05/03/18 0745 05/03/18 1302  GLUCAP 96 87 118* 154* 143*    Studies/Results: No results found. Medications: . sodium chloride    . sodium chloride    .  ceFAZolin (ANCEF) IV 1 g (05/02/18 2207)   . atorvastatin  40 mg Oral q1800  . budesonide  0.25 mg Nebulization BID  . busPIRone  5 mg Oral BID  . calcitRIOL  0.5 mcg Oral Q M,W,F  . Chlorhexidine Gluconate Cloth  6 each Topical Q0600  . insulin aspart  0-15 Units Subcutaneous TID WC  . insulin aspart  0-5 Units Subcutaneous QHS  . insulin aspart  3 Units Subcutaneous TID WC  . levothyroxine  25 mcg Oral QAC breakfast  . pantoprazole (PROTONIX) IV  40 mg Intravenous Q12H  . senna-docusate  1 tablet Oral BID  . Warfarin - Pharmacist Dosing Inpatient   Does not apply [phone]

## 2018-05-03 NOTE — Progress Notes (Signed)
7 beat run v-tach per CCMD; pt assessed.   [place]  Mikia Delaluz, RN

## 2018-05-03 NOTE — Progress Notes (Signed)
Upon entering pts room pt was c/o being cold.  Did not want to do a  wgt or  Get chg bath until warmer.  Applied blankets.  Will continue to monitor Saunders Revel T

## 2018-05-03 NOTE — Progress Notes (Signed)
[place] for warfarin Indication: LVAD  Patient Measurements: Height: 5\' 5"  (165.1 cm) Weight: (pt refused wgt until later this am cold ) IBW/kg (Calculated) : 61.5 Heparin Dosing Weight: 79.5 kg  Vital Signs: Temp: 98.2 F (36.8 C) (06/30 0604) Temp Source: Oral (06/30 0604) BP: 103/85 (06/30 1151) Pulse Rate: 84 (06/30 0604)  Labs: Recent Labs    05/01/18 0425 05/01/18 1338 05/01/18 1824 05/02/18 1346 05/02/18 1704 05/03/18 0804  HGB 9.5*  --   --  9.1*  --  9.7*  HCT 31.6*  --   --  30.1*  --  32.5*  PLT 147*  --   --  154  --  175  LABPROT 18.3*  --   --  42.5* 43.9* 31.5*  INR 1.54  --   --  4.51* 4.71* 3.08  HEPARINUNFRC 0.12* >2.20* 0.43 0.30  --   --   CREATININE 6.09*  --   --  4.61*  --  3.33*    Medical History: Past Medical History:  Diagnosis Date  . AICD (automatic cardioverter/defibrillator) present   . Asthma   . AVM (arteriovenous malformation) of colon 07/07/2017  . CHF (congestive heart failure) ([place])   . Diabetes ([place])   . ESRF (end stage renal failure) ([place]) 07/07/2017   pt receiving hemodialysis Monday- Wednesday-friday, sometimes Saturday  . Kidney disease   . LVAD (left ventricular assist device) present ([place]) 12/2016  . Permanent atrial fibrillation ([place]) 07/07/2017  . Presence of permanent cardiac pacemaker    AICD  . Sleep apnea     Assessment: 41 yom who underwent HM2 LVAD implantation on 12/14/2015. Has undergoing extensive GI workup to evaluate bleeding. Was stopped on anticoagulants as of admission on 03/09/2018. Last outpatient regimen was (as of 02/17/2018): 7.5 mg daily except 10 mg on Tues/Thurs for goal 2-2.5. Goal INR was decreased to 1.8-2.3 after admission on 02/28/2018. Now s/p laparoscopic surgical enteroscopy with 4 clips on 04/10/2018.  Heparin and warfarin were resumed on 6/27. Patient has only received two doses of warfarin. Unexpectedly, INR is now up to 4.7 - warfarin held last pm and INR  fell to 3 - will hold again today No s/s bleeding, CBC stable   Goal of Therapy:  Heparin level: 0.3-0.5 INR goal: 1.8-2.2 Monitor platelets by anticoagulation protocol: Yes   Plan:  -Hold warfarin  Daily INR Monitor s/s bleeding   [name] Pharm.D. CPP, BCPS Clinical Pharmacist [phone] 05/03/2018 3:19 PM

## 2018-05-04 LAB — CBC
HEMATOCRIT: 31.1 % — AB (ref 39.0–52.0)
Hemoglobin: 9.3 g/dL — ABNORMAL LOW (ref 13.0–17.0)
MCH: 29.1 pg (ref 26.0–34.0)
MCHC: 29.9 g/dL — ABNORMAL LOW (ref 30.0–36.0)
MCV: 97.2 fL (ref 78.0–100.0)
PLATELETS: 149 10*3/uL — AB (ref 150–400)
RBC: 3.2 MIL/uL — ABNORMAL LOW (ref 4.22–5.81)
RDW: 19.9 % — AB (ref 11.5–15.5)
WBC: 15.1 10*3/uL — AB (ref 4.0–10.5)

## 2018-05-04 LAB — BASIC METABOLIC PANEL
Anion gap: 9 (ref 5–15)
BUN: 15 mg/dL (ref 8–23)
CHLORIDE: 93 mmol/L — AB (ref 98–111)
CO2: 29 mmol/L (ref 22–32)
CREATININE: 4.57 mg/dL — AB (ref 0.61–1.24)
Calcium: 7.4 mg/dL — ABNORMAL LOW (ref 8.9–10.3)
GFR calc Af Amer: 13 mL/min — ABNORMAL LOW (ref >60)
GFR calc non Af Amer: 12 mL/min — ABNORMAL LOW (ref >60)
Glucose, Bld: 169 mg/dL — ABNORMAL HIGH (ref 70–99)
POTASSIUM: 4.3 mmol/L (ref 3.5–5.1)
SODIUM: 131 mmol/L — AB (ref 135–145)

## 2018-05-04 LAB — PROTIME-INR
INR: 2.32
Prothrombin Time: 25.2 seconds — ABNORMAL HIGH (ref 11.4–15.2)

## 2018-05-04 LAB — GLUCOSE, CAPILLARY
GLUCOSE-CAPILLARY: 159 mg/dL — AB (ref 70–99)
GLUCOSE-CAPILLARY: 100 mg/dL — AB (ref 70–99)
GLUCOSE-CAPILLARY: 115 mg/dL — AB (ref 70–99)
Glucose-Capillary: 89 mg/dL (ref 70–99)

## 2018-05-04 LAB — LACTATE DEHYDROGENASE: LDH: 260 U/L — ABNORMAL HIGH (ref 98–192)

## 2018-05-04 MED ORDER — WARFARIN SODIUM 2.5 MG PO TABS
2.5000 mg | ORAL_TABLET | Freq: Once | ORAL | Status: AC
  Administered 2018-05-04: 2.5 mg via ORAL
  Filled 2018-05-04: qty 1

## 2018-05-04 MED ORDER — DARBEPOETIN ALFA 100 MCG/0.5ML IJ SOSY
PREFILLED_SYRINGE | INTRAMUSCULAR | Status: AC
  Administered 2018-05-04: 100 ug via INTRAVENOUS
  Filled 2018-05-04: qty 0.5

## 2018-05-04 NOTE — NC FL2 (Signed)
[place] LEVEL OF CARE SCREENING TOOL     IDENTIFICATION  Patient Name: Trevor [name] Birthdate: Feb 18, 1946 Sex: male Admission Date (Current Location): 04/30/2018  [facility] and [place] Number:  Herbalist and Address:  [facility] [place], [place] [address] [place], [place] [address]      Provider Number: [id number]  Attending Physician Name and Address:  Jolaine Artist, MD  Relative Name and Phone Number:  Harlow Carrizales [phone]    Current Level of Care: Hospital Recommended Level of Care: [place] Prior Approval Number:    Date Approved/Denied:   PASRR Number: [id number]  Discharge Plan:      Current Diagnoses: Patient Active Problem List   Diagnosis Date Noted  . Ileus ([place])   . Arteriovenous malformation   . GI bleed 03/09/2018  . Small bowel bleed not requiring more than 4 units of blood in 24 hours, ICU, or surgery   . Heme positive stool   . Cellulitis 12/03/2017  . 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 Chronic systolic heart failure ([place]) 09/08/2017  . ESRD (end stage renal disease) ([place]) 07/07/2017  . AVM (arteriovenous malformation) of colon 07/07/2017  . Thrombocytopenia ([place]) 07/07/2017  . Permanent atrial fibrillation ([place]) 07/07/2017  . Anemia of chronic disease 07/07/2017  . CHF (congestive heart failure), NYHA class IV ([place]) 06/13/2017  . AVM (arteriovenous malformation) of colon with hemorrhage   . Symptomatic anemia 02/10/2017  . Type 2 diabetes mellitus ([place]) 01/17/2017  . COPD (chronic obstructive pulmonary disease) ([place]) 01/17/2017  . LVAD (left ventricular assist device) present ([place]) 01/09/2017  . Presence of left ventricular assist device (LVAD) ([place])   . Palliative care encounter   . Cardiorenal syndrome with renal failure 11/28/2016  . Cardiomyopathy, ischemic 11/27/2016  . Coronary artery disease 11/27/2016  . Status post aortic valve replacement 11/27/2016   . Asthma, chronic 11/27/2016  . OSA (obstructive sleep apnea) 11/27/2016    Orientation RESPIRATION BLADDER Height & Weight     Self, Time, Situation, Place  Normal Continent Weight: 171 lb 15.3 oz (78 kg) Height:  5\' 5"  (165.1 cm)  BEHAVIORAL SYMPTOMS/MOOD NEUROLOGICAL BOWEL NUTRITION STATUS      Continent Diet(heart healthy/carb modified fluid restriction )  AMBULATORY STATUS COMMUNICATION OF NEEDS Skin   Limited Assist Verbally                         Personal Care Assistance Level of Assistance  Bathing, Feeding, Dressing Bathing Assistance: Limited assistance Feeding assistance: Independent Dressing Assistance: Limited assistance     Functional Limitations Info  Sight, Hearing, Speech Sight Info: Adequate Hearing Info: Adequate Speech Info: Adequate    SPECIAL CARE FACTORS FREQUENCY  PT (By licensed PT), OT (By licensed OT)     PT Frequency: 5x wk OT Frequency: 5x wk            Contractures Contractures Info: Not present    Additional Factors Info  Code Status, Isolation Precautions Code Status Info: Partial Code (ventricular assist device in place)        Isolation Precautions Info: MDRO     Current Medications (05/04/2018):  This is the current hospital active medication list Current Facility-Administered Medications  Medication Dose Route Frequency Provider Last Rate Last Dose  . 0.9 %  sodium chloride infusion  100 mL Intravenous PRN Loren Racer, PA-C      . 0.9 %  sodium chloride infusion  100 mL Intravenous PRN Loren Racer, PA-C      . acetaminophen (TYLENOL) tablet 650 mg  650 mg Oral Q4H PRN Georgiana Shore, NP   650 mg at 05/02/18 1600  . albuterol (PROVENTIL) (2.5 MG/3ML) 0.083% nebulizer solution 2.5 mg  2.5 mg Nebulization Q4H PRN Georgiana Shore, NP      . atorvastatin (LIPITOR) tablet 40 mg  40 mg Oral q1800 Georgiana Shore, NP   40 mg at 05/03/18 1727  . benzonatate (TESSALON) capsule 100 mg  100 mg Oral TID PRN Georgiana Shore, NP   100 mg at 05/03/18 1401  . budesonide (PULMICORT) nebulizer solution 0.25 mg  0.25 mg Nebulization BID Georgiana Shore, NP   0.25 mg at 05/04/18 0759  . busPIRone (BUSPAR) tablet 5 mg  5 mg Oral BID Georgiana Shore, NP   5 mg at 05/04/18 1610  . calcitRIOL (ROCALTROL) capsule 0.5 mcg  0.5 mcg Oral Q M,W,F Georgiana Shore, NP   0.5 mcg at 05/04/18 9604  . ceFAZolin (ANCEF) IVPB 1 g/50 mL premix  1 g Intravenous Q24H Bensimhon, Shaune Pascal, MD 100 mL/hr at 05/03/18 2022 1 g at 05/03/18 2022  . Chlorhexidine Gluconate Cloth 2 % PADS 6 each  6 each Topical Q0600 Loren Racer, PA-C   6 each at 05/01/18 [phone]  . Darbepoetin Alfa (ARANESP) 100 MCG/0.5ML injection           . Darbepoetin Alfa (ARANESP) injection 100 mcg  100 mcg Intravenous Q Mon-HD Zeyfang, David, PA-C      . insulin aspart (novoLOG) injection 0-15 Units  0-15 Units Subcutaneous TID WC Georgiana Shore, NP   3 Units at 05/04/18 [phone]  . insulin aspart (novoLOG) injection 0-5 Units  0-5 Units Subcutaneous QHS Georgiana Shore, NP      . insulin aspart (novoLOG) injection 3 Units  3 Units Subcutaneous TID WC Georgiana Shore, NP   3 Units at 05/04/18 [phone]  . levothyroxine (SYNTHROID, LEVOTHROID) tablet 25 mcg  25 mcg Oral QAC breakfast Georgiana Shore, NP   25 mcg at 05/04/18 0618  . lidocaine (PF) (XYLOCAINE) 1 % injection 5 mL  5 mL Intradermal PRN Loren Racer, PA-C      . lidocaine-prilocaine (EMLA) cream 1 application  1 application Topical PRN Loren Racer, PA-C      . ondansetron [facility]) injection 4 mg  4 mg Intravenous Q6H PRN Georgiana Shore, NP      . oxyCODONE (Oxy IR/ROXICODONE) immediate release tablet 5 mg  5 mg Oral Q4H PRN Georgiana Shore, NP   5 mg at 05/04/18 1148  . pantoprazole (PROTONIX) injection 40 mg  40 mg Intravenous Q12H Georgiana Shore, NP   40 mg at 05/04/18 2956  . pentafluoroprop-tetrafluoroeth (GEBAUERS) aerosol 1 application  1 application Topical PRN Loren Racer, PA-C   2  application at 21/30/86 0841  . senna-docusate (Senokot-S) tablet 1 tablet  1 tablet Oral BID Georgiana Shore, NP   1 tablet at 05/04/18 5784  . sildenafil (REVATIO) tablet 20 mg  20 mg Oral TID Bensimhon, Shaune Pascal, MD   20 mg at 05/04/18 6962  . traMADol (ULTRAM) tablet 100 mg  100 mg Oral TID PRN Bensimhon, Shaune Pascal, MD   100 mg at 05/04/18 1442  . warfarin (COUMADIN) tablet 2.5 mg  2.5 mg Oral ONCE-1800 Bensimhon, Shaune Pascal, MD      .  Warfarin - Pharmacist Dosing Inpatient   Does not apply q1800 Bensimhon, Shaune Pascal, MD         Discharge Medications: Please see discharge summary for a list of discharge medications.  Relevant Imaging Results:  Relevant Lab Results:   Additional Information SS# [id number] (pt has lVAD)   Wende Neighbors, LCSW

## 2018-05-04 NOTE — Progress Notes (Signed)
Inpatient Rehabilitation Admissions Coordinator  I continue to follow pt's progress to assist with planning dispo when pt medically ready for d/c.  Danne Baxter, RN, MSN Rehab Admissions Coordinator [phone] 05/04/2018 2:50 PM

## 2018-05-04 NOTE — Progress Notes (Signed)
Occupational Therapy Treatment Patient Details Name: Trevor [name] MRN: [id number] DOB: 13-Feb-1946 Today's Date: 05/04/2018    History of present illness Trevor [name] is a 72 y.o. male with complex GI history with multiple transitions to [facility] and [facility] for management of GIB since May as well as development of Left knee infection s/p aspiration. PMHx: CAD s/p CABG x 4 2010, OSA, AS with TAVR 2015 , ESRD on HD, DM2, chronic Afib, asthma, ICM with Medtr[employer] ICD.  S/P HMII LVAD for Destination therapy on 12/13/2016.    OT comments  Pt making good progress toward goals.  He was able to stand at sink to perform grooming with min guard assist.  He is reliant on UE support while standing.  He had a significant LOB when he attempted to adjust controller with bil. UEs - required mod A to prevent fall.   Pain 7/10  Follow Up Recommendations  CIR;Supervision/Assistance - 24 hour    Equipment Recommendations  None recommended by OT    Recommendations for Other Services      Precautions / Restrictions Precautions Precautions: Other (comment);Fall Precaution Comments: LVAD Required Braces or Orthoses: Other Brace/Splint Other Brace/Splint: hinged knee brace.  Spoke with Hilbert Odor, who stated if pt feels that the knee is unstable when he is WBing, then he should wear the brace.  Pt reports that he does feel more stable with the brace on 05/04/18       Mobility Bed Mobility Overal bed mobility: Needs Assistance Bed Mobility: Supine to Sit     Supine to sit: Supervision;HOB elevated        Transfers Overall transfer level: Needs assistance Equipment used: Rolling walker (2 wheeled);4-wheeled walker Transfers: Sit to/from [employer] to Stand: Min guard Stand pivot transfers: Min guard       General transfer comment: verbal cues for hand placement     Balance Overall balance assessment: Needs assistance Sitting-balance support: Feet supported Sitting  balance-[name]: Good     Standing balance support: Single extremity supported;During functional activity Standing balance-[name]: Poor Standing balance comment: reliant on UE support.  When pt attempted to lift bil. UEs off RW to adjust controller, he had significant LOB to the Rt requiring mod A to recover                            ADL either performed or assessed with clinical judgement   ADL Overall ADL's : Needs assistance/impaired     Grooming: Wash/dry hands;Wash/dry face;Oral care;Brushing hair;Min guard;Standing               Lower Body Dressing: Moderate assistance;Sit to/from stand   Toilet Transfer: Min guard;Ambulation;Comfort height toilet;Grab bars;RW           Functional mobility during ADLs: Min guard;Rolling walker       Vision       Perception     Praxis      Cognition Arousal/Alertness: Awake/alert Behavior During Therapy: WFL for tasks assessed/performed Overall Cognitive Status: Within Functional Limits for tasks assessed                                          Exercises     Shoulder Instructions       General Comments VSS    Pertinent Vitals/ Pain  Pain Assessment: 0-10 Pain Score: 7  Pain Location: Lt knee with WBing and movement  Pain Descriptors / Indicators: Aching;Grimacing Pain Intervention(s): Monitored during session;Repositioned  Home Living                                          Prior Functioning/Environment              Frequency  Min 2X/week        Progress Toward Goals  OT Goals(current goals can now be found in the care plan section)  Progress towards OT goals: Progressing toward goals     Plan Discharge plan remains appropriate    Co-evaluation                 AM-PAC PT "6 Clicks" Daily Activity     Outcome Measure   Help from another person eating meals?: None Help from another person taking care of personal  grooming?: A Little Help from another person toileting, which includes using toliet, bedpan, or urinal?: A Little Help from another person bathing (including washing, rinsing, drying)?: A Lot Help from another person to put on and taking off regular upper body clothing?: A Little Help from another person to put on and taking off regular lower body clothing?: A Lot 6 Click Score: 17    End of Session Equipment Utilized During Treatment: Rolling walker;Gait belt  OT Visit Diagnosis: Pain Pain - Right/Left: Left Pain - part of body: Knee   Activity Tolerance Patient tolerated treatment well   Patient Left in chair;with call bell/phone within reach;with family/visitor present   Nurse Communication Mobility status        Time: 1937-9024 OT Time Calculation (min): 45 min  Charges: OT General Charges $OT Visit: 1 Visit OT Treatments $Self Care/Home Management : 38-52 mins  [facility], OTR/L [phone]    Trevor [name] M 05/04/2018, 11:39 AM

## 2018-05-04 NOTE — Care Management Note (Signed)
Case Management Note  Patient Details  Name: Trevor [name] MRN: [id number] Date of Birth: 01-14-1946  Subjective/Objective:  Pt now transferred from [facility] post double balloon retro endoscopy and IR embolization x 3 for ongoing GI bleed.  Per attending group pt was transported back to [facility] to re-intiate anticoagulation                  Action/Plan:   PTA from home, on outpt HD.  Pt has PCP and current prescription insurance.  CIR recommended - CSW consulted for SNF back up plan   Expected Discharge Date:                  Expected Discharge Plan:  [place]  In-House Referral:  Clinical Social Work  Discharge planning Services  CM Consult  Post Acute Care Choice:    Choice offered to:     DME Arranged:    DME Agency:     HH Arranged:    HH Agency:     Status of Service:     If discussed at [employer], dates discussed:    Additional Comments:  [name], RN 05/04/2018, 3:25 PM

## 2018-05-04 NOTE — Progress Notes (Addendum)
Physical Therapy Treatment Patient Details Name: Trevor [name] MRN: [id number] DOB: 08/09/1946 Today's Date: 05/04/2018    History of Present Illness Trevor [name] is a 72 y.o. male with complex GI history with multiple transitions to [facility] and [facility] for management of GIB since May as well as development of Left knee infection s/p aspiration. PMHx: CAD s/p CABG x 4 2010, OSA, AS with TAVR 2015 , ESRD on HD, DM2, chronic Afib, asthma, ICM with Medtronic ICD.  S/P HMII LVAD for Destination therapy on 12/13/2016.     PT Comments    Pt very familiar from prior admissions. Pt remains limited with gait and transfers due to left knee pain but very willing to mobilize and progress activity. Pt frustrated with having not been outside for 2 months and utilized transport chair to get pt outside to perform HEP, pt very appreciative. Pt with HR 68-83 with activity and encouraged continued HEP and gait with nursing. Will continue to follow and pt would benefit from CIR and intensive therapy given complicated course prior to return home. PT independently performed transition to and from battery.    Follow Up Recommendations  CIR     Equipment Recommendations  3in1 (PT)    Recommendations for Other Services       Precautions / Restrictions Precautions Precautions: Other (comment);Fall Precaution Comments: LVAD Required Braces or Orthoses: Other Brace/Splint Other Brace/Splint: hinged knee brace per pt comfort Restrictions Weight Bearing Restrictions: No    Mobility  Bed Mobility Overal bed mobility: Needs Assistance Bed Mobility: Supine to Sit     Supine to sit: Supervision;HOB elevated     General bed mobility comments: pt up in chair upon PT arrival  Transfers Overall transfer level: Needs assistance Equipment used: Rolling walker (2 wheeled);4-wheeled walker Transfers: Sit to/from Stand Sit to Stand: Min guard;Min assist Stand pivot transfers: Min guard;Min assist       General  transfer comment: minguard from chair with armrests, min from chair without armrests  Ambulation/Gait Ambulation/Gait assistance: Min guard Gait Distance (Feet): 24 Feet Assistive device: Rolling walker (2 wheeled) Gait Pattern/deviations: Step-to pattern;Decreased stance time - left   Gait velocity interpretation: <1.31 ft/sec, indicative of household ambulator General Gait Details: pt able to perform sequence without cues, guarding for safety, decreased speed   Stairs             Wheelchair Mobility    Modified Rankin (Stroke Patients Only)       Balance Overall balance assessment: Needs assistance Sitting-balance support: Feet supported Sitting balance-[name]: Good     Standing balance support: Single extremity supported;During functional activity Standing balance-[name]: Poor Standing balance comment: reliant on UE support.  When pt attempted to lift bil. UEs off RW to adjust controller, he had significant LOB to the Rt requiring mod A to recover                             Cognition Arousal/Alertness: Awake/alert Behavior During Therapy: WFL for tasks assessed/performed Overall Cognitive Status: Within Functional Limits for tasks assessed                                        Exercises General Exercises - Lower Extremity Long Arc Quad: AROM;15 reps;Seated;Left;Right;AAROM(AAROM on LLE) Hip Flexion/Marching: AROM;AAROM;15 reps;Seated;Left;Right(AAROM on LLE)    General Comments General comments (skin integrity,  edema, etc.): VSS      Pertinent Vitals/Pain Pain Assessment: 0-10 Pain Score: 6  Pain Location: Lt knee with WBing and movement  Pain Descriptors / Indicators: Aching;Grimacing Pain Intervention(s): Limited activity within patient's tolerance;Repositioned;Monitored during session    Home Living                      Prior Function            PT Goals (current goals can now be found in the  care plan section) Progress towards PT goals: Progressing toward goals    Frequency           PT Plan Current plan remains appropriate    Co-evaluation              AM-PAC PT "6 Clicks" Daily Activity  Outcome Measure  Difficulty turning over in bed (including adjusting bedclothes, sheets and blankets)?: A Little Difficulty moving from lying on back to sitting on the side of the bed? : A Little Difficulty sitting down on and standing up from a chair with arms (e.g., wheelchair, bedside commode, etc,.)?: A Lot Help needed moving to and from a bed to chair (including a wheelchair)?: A Little Help needed walking in hospital room?: A Little Help needed climbing 3-5 steps with a railing? : A Lot 6 Click Score: 16    End of Session Equipment Utilized During Treatment: Gait belt Activity Tolerance: Patient tolerated treatment well Patient left: in chair;with call bell/phone within reach Nurse Communication: Mobility status PT Visit Diagnosis: Muscle weakness (generalized) (M62.81);Other abnormalities of gait and mobility (R26.89)     Time: 8527-7824 PT Time Calculation (min) (ACUTE ONLY): 48 min  Charges:  $Gait Training: 8-22 mins $Therapeutic Exercise: 8-22 mins $Therapeutic Activity: 8-22 mins                    G Codes:       [name], PT [phone]    [place] 05/04/2018, 12:44 PM

## 2018-05-04 NOTE — Progress Notes (Signed)
Inpatient Diabetes Program Recommendations  AACE/ADA: New Consensus Statement on Inpatient Glycemic Control (2015)  Target Ranges:  Prepandial:   less than 140 mg/dL      Peak postprandial:   less than 180 mg/dL (1-2 hours)      Critically ill patients:  140 - 180 mg/dL   Lab Results  Component Value Date   GLUCAP 159 (H) 05/04/2018   HGBA1C 7.5 09/03/2017    Review of Glycemic Control Results for Trevor [name], Trevor [name] (MRN [id number]) as of 05/04/2018 11:42  Ref. Range 05/03/2018 17:07 05/03/2018 17:32 05/03/2018 18:15 05/03/2018 21:03 05/04/2018 08:11  Glucose-Capillary Latest Ref Range: 70 - 99 mg/dL 51 (L) 59 (L) 109 (H) 162 (H) 159 (H)   Diabetes history: Type 2 DM Outpatient Diabetes medications: Lantus 8 units QHS Current orders for Inpatient glycemic control: Novolog 0-15 units TID, Novolog 0-5 units QHS, Novolog 3 units TID  Inpatient Diabetes Program Recommendations:    Noted that patient had hypoglycemic event of 51 mg/dL following meal coverage administration, however, patient did not eat >50% of meal, thus contributing to low BS.  Consider decreasing correction to Novolog 0-9 unit TID.  Thanks, Bronson Curb, MSN, RNC-OB Diabetes Coordinator [phone] (8a-5p)

## 2018-05-04 NOTE — Discharge Summary (Addendum)
Advanced Heart Failure Team  Discharge Summary   Patient ID: Trevor [name] MRN: [id number], DOB/AGE: 73-Jun-1947 73 y.o. Admit date: 04/30/2018 D/C date:     05/05/2018   Primary Discharge Diagnoses:  1. GI Bleed/Symptomatic Anemia 2. 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 S/pHM II LVAD placed 2054for DT. 3. VAD, HM-II 4. ESRD- M/W/F 5. Chronic A fib 6. HTN 7.Septicleftknee 8. DM II 9.Hx ofCAD s/pCABG 9. Hx ofTAVR 10. Deconditioning 11. Hypomagnesemia 12. Insomnia  Hospital Course:   Trevor [name] is a 72 y.o. male with a history of CAD s/p CABG x 4 2010, OSA, AS with TAVR 2015, ,ESRD on HD,DM2,chronicatrial fibrillation, asthma,GI bleed,and ischemic cardiomyopathy with Medtronic ICD.S/PHMII LVAD for Destination therapy on 12/14/2015.  Admitted to [facility] 03/09/2018 with recurrent GI bleed. Coumadin was stopped. He received 3UPRBCs. Transferred to [facility] for deep entersospcopy on 03/10/2018.   Hospital course at [facility] double retro ballon scope and he was sent for IR embolization x 3but he had no evidence of bleeding. Received12 UPRBCs. He remained off anticoagulants.   Transferred back to [facility] 5/17. IR consulted CTA 03/23/18 with no active bleeding, however repeat capsule study 03/25/18 showed active bleeding in mid illeum. General surgery team consulted and recommended intra-operative endoscopy and surgical resection once source is found at [facility]. Received additional 15 uPRBCs. Course complicated by left knee pain requiring aspiration and steroid injection. He also had abdominal bloating and distension requiring NGT for decompression. Resolved after BM.   Transferred back to [facility] 04/01/18. He received 13 uPRBCs at [facility]. Underwent laparoscopic surgical enteroscopy on 6/7 and received 4 clips. He had no further bleeding post-op. Last octreotide injection 6/13. Danazol was DC'd. ASA and coumadin were not resumed. VAD parameters were stable.    Course  complicated by AMS and concern for ischemic bowel. Improved with NGT decompression and bowel rest. Head CT negative. Blood cultures and UA were negative. Tolerated clears and thought stable for diet advancement.    Additionally, required multiple left knee aspirations and OR washouts. Aspirate cultures positive for staph epi and clostridium perfingen. Started on Vanc and narrowed to Ancef. Sutures to be reassessed by ortho to determine removal date. Blood cultures remained negative.  Returned to [facility] 04/30/18 for further management and for CIR consideration. He remained on IV ABX with septic knee as above. Renal followed along for his HD.  CIR consulted for admit, and they were able to work with HD to ensure plumbing on CIR floor was able to sustain HD.   Pt had no further bleeding, and Ortho followed along for his septic knee.   Pt examined am of 05/05/18 and thought stable for discharge to CIR. Arrangements made for HD. HF team will continue to follow along.   LVAD Interrogation HM 2: Flow 4.6 liters/min, speed 9200, power 5.6 , PI 5.1 8 PI events/24 hours.  Discharge Weight: 171 lbs Discharge Vitals: Blood pressure 92/63, pulse (!) 40, temperature 98.3 F (36.8 C), temperature source Oral, resp. rate 18, height 5\' 5"  (1.651 m), weight 163 lb 9.3 oz (74.2 kg), SpO2 99 %.  Labs: Lab Results  Component Value Date   WBC 12.0 (H) 05/05/2018   HGB 9.7 (L) 05/05/2018   HCT 31.8 (L) 05/05/2018   MCV 96.7 05/05/2018   PLT 153 05/05/2018    Recent Labs  Lab 05/05/18 0644  NA 135  K 4.2  CL 96*  CO2 31  BUN 7*  CREATININE 3.29*  CALCIUM 7.5*  GLUCOSE 150*  Lab Results  Component Value Date   CHOL 59 12/04/2016   HDL 30 (L) 12/04/2016   LDLCALC 15 12/04/2016   TRIG 68 12/04/2016   BNP (last 3 results) No results for input(s): BNP in the last 8760 hours.  ProBNP (last 3 results) No results for input(s): PROBNP in the last 8760 hours.   Diagnostic Studies/Procedures   No  results found.  Discharge Medications   Allergies as of 05/05/2018   No Known Allergies     Medication List    STOP taking these medications   danazol 100 MG capsule Commonly known as:  DANOCRINE   diphenhydrAMINE 25 mg capsule Commonly known as:  BENADRYL   gabapentin 600 MG tablet Commonly known as:  NEURONTIN   guaiFENesin 100 MG/5ML Soln Commonly known as:  ROBITUSSIN   magnesium oxide 400 MG tablet Commonly known as:  MAG-OX   metoCLOPramide 10 MG/10ML Soln Commonly known as:  REGLAN   multivitamin Tabs tablet   predniSONE 20 MG tablet Commonly known as:  DELTASONE     TAKE these medications   acetaminophen 325 MG tablet Commonly known as:  TYLENOL Take 2 tablets (650 mg total) by mouth every 4 (four) hours as needed for headache or mild pain.   albuterol 108 (90 Base) MCG/ACT inhaler Commonly known as:  PROVENTIL HFA;VENTOLIN HFA Inhale 2 puffs into the lungs every 4 (four) hours as needed for wheezing or shortness of breath.   atorvastatin 40 MG tablet Commonly known as:  LIPITOR TAKE 1 TABLET(40 MG) BY MOUTH DAILY What changed:    how much to take  how to take this  when to take this  additional instructions   benzonatate 100 MG capsule Commonly known as:  TESSALON Take 1 capsule (100 mg total) by mouth 3 (three) times daily as needed for cough.   busPIRone 5 MG tablet Commonly known as:  BUSPAR Take 1 tablet (5 mg total) by mouth 2 (two) times daily.   calcitRIOL 0.5 MCG capsule Commonly known as:  ROCALTROL Take 1 capsule (0.5 mcg total) by mouth every Monday, Wednesday, and Friday with hemodialysis.   Darbepoetin Alfa 200 MCG/0.4ML Sosy injection Commonly known as:  ARANESP Inject 0.4 mLs (200 mcg total) into the vein every Monday with hemodialysis.   fluticasone 110 MCG/ACT inhaler Commonly known as:  FLOVENT HFA Inhale 2 puffs into the lungs 2 (two) times daily.   heparin 1000 unit/mL Soln injection 1 mL (1,000 Units total) by  Dialysis route as needed (in dialysis).   insulin glargine 100 UNIT/ML injection Commonly known as:  LANTUS Inject 0.08 mLs (8 Units total) into the skin at bedtime.   levothyroxine 25 MCG tablet Commonly known as:  SYNTHROID, LEVOTHROID Take 1 tablet (25 mcg total) by mouth daily before breakfast.   mometasone-formoterol 200-5 MCG/ACT Aero Commonly known as:  DULERA Inhale 2 puffs into the lungs 2 (two) times daily.   octreotide 500 mcg in sodium chloride 0.9 % 250 mL Inject 50 mcg/hr into the vein continuous.   ondansetron 4 MG/2ML Soln injection Commonly known as:  ZOFRAN Inject 2 mLs (4 mg total) into the vein every 6 (six) hours as needed for nausea.   oxyCODONE 5 MG immediate release tablet Commonly known as:  Oxy IR/ROXICODONE Take 1 tablet (5 mg total) by mouth every 4 (four) hours as needed for moderate pain.   pantoprazole 40 MG tablet Commonly known as:  PROTONIX Take 1 tablet (40 mg total) by mouth 2 (two) times  daily. What changed:  when to take this   senna-docusate 8.6-50 MG tablet Commonly known as:  Senokot-S Take 1 tablet by mouth 2 (two) times daily.   sildenafil 20 MG tablet Commonly known as:  REVATIO Take 1 tablet (20 mg total) by mouth 3 (three) times daily. What changed:  how much to take   traMADol 50 MG tablet Commonly known as:  ULTRAM Take 2 tablets (100 mg total) by mouth 3 (three) times daily as needed for severe pain. What changed:    how much to take  when to take this  reasons to take this  additional instructions   traZODone 50 MG tablet Commonly known as:  DESYREL Take 1 tablet (50 mg total) by mouth at bedtime as needed for sleep. What changed:    medication strength  how much to take  reasons to take this  additional instructions       Disposition   The patient will be discharged in stable condition to inpatient CIR.   Discharge Instructions    Diet - low sodium heart healthy   Complete by:  As directed     Increase activity slowly   Complete by:  As directed      Follow-up Information    [facility] [place] Follow up.   Specialty:  Cardiology Why:  Appointment will be made following discharged from [place]. We will continue to follow.  Contact information: [address] [id number] [place] [facility] Follow up.   Why:  Will be discharged to CIR.  Contact information: [address] [id number] [place] [phone]            Duration of Discharge Encounter: Greater than 35 minutes   Signed, Annamaria Helling  05/05/2018, 11:33 AM    Patient seen and examined with the above-signed Advanced Practice Provider and/or Housestaff. I personally reviewed laboratory data, imaging studies and relevant notes. I independently examined the patient and formulated the important aspects of the plan. I have edited the note to reflect any of my changes or salient points. I have personally discussed the plan with the patient and/or family.  Continues to improve. Volume status looks better after HD. INR 2.0. No further bleeding. Left knee doing well. WBC down. VAD interrogated personally. Parameters stable. I have discussed with CIR coordinator and will plan for transfer to CIR today for ongoing rehab. The VAD team will follow on a daily basis. Appreciate their care.   Glori Bickers, MD  6:57 PM

## 2018-05-04 NOTE — Progress Notes (Signed)
ANTICOAGULATION CONSULT NOTE  Pharmacy Consult for warfarin Indication: LVAD  Patient Measurements: Height: 5\' 5"  (165.1 cm) Weight: 167 lb 1.7 oz (75.8 kg) IBW/kg (Calculated) : 61.5 Heparin Dosing Weight: 79.5 kg  Vital Signs: Temp: 98.1 F (36.7 C) (07/01 0812) Temp Source: Oral (07/01 0812) BP: 83/72 (07/01 1014) Pulse Rate: 73 (07/01 1014)  Labs: Recent Labs    05/01/18 1338 05/01/18 1824  05/02/18 1346 05/02/18 1704 05/03/18 0804 05/04/18 0624  HGB  --   --    < > 9.1*  --  9.7* 9.3*  HCT  --   --   --  30.1*  --  32.5* 31.1*  PLT  --   --   --  154  --  175 149*  LABPROT  --   --    < > 42.5* 43.9* 31.5* 25.2*  INR  --   --    < > 4.51* 4.71* 3.08 2.32  HEPARINUNFRC >2.20* 0.43  --  0.30  --   --   --   CREATININE  --   --   --  4.61*  --  3.33* 4.57*   < > = values in this interval not displayed.    Medical History: Past Medical History:  Diagnosis Date  . AICD (automatic cardioverter/defibrillator) present   . Asthma   . AVM (arteriovenous malformation) of colon 07/07/2017  . CHF (congestive heart failure) ([place])   . Diabetes ([place])   . ESRF (end stage renal failure) ([place]) 07/07/2017   pt receiving hemodialysis Monday- Wednesday-friday, sometimes Saturday  . Kidney disease   . LVAD (left ventricular assist device) present ([place]) 12/2016  . Permanent atrial fibrillation ([place]) 07/07/2017  . Presence of permanent cardiac pacemaker    AICD  . Sleep apnea     Assessment: 81 yom who underwent HM2 LVAD implantation on 12/14/2015. Has undergoing extensive GI workup to evaluate bleeding. Was stopped on anticoagulants as of admission on 03/09/2018. Last outpatient regimen was (as of 02/17/2018): 7.5 mg daily except 10 mg on Tues/Thurs for goal 2-2.5. Goal INR was decreased to 1.8-2.3 after admission on 02/28/2018. Now s/p laparoscopic surgical enteroscopy with 4 clips on 04/10/2018.  Heparin and warfarin were resumed on 6/27 >> now off heparin infusion. Patient has only  received two doses of warfarin. Unexpectedly, INR peaked at 4.71- now drifting down to 2.32. Hgb 9.3, plt 149, LDH 260. No s/sx of bleeding.   Goal of Therapy:  INR goal: 1.8-2.2 Monitor platelets by anticoagulation protocol: Yes   Plan:  -Order warfarin 2.5 mg tonight -Daily INR -Monitor s/s bleeding  Doylene Canard, PharmD Clinical Pharmacist  Pager: [phone] Phone: [phone] 05/04/2018 10:33 AM

## 2018-05-04 NOTE — Progress Notes (Signed)
Patient ID: Trevor [name], male   DOB: 07/23/1946, 72 y.o.   MRN: [id number] [name] Progress Note   Assessment/ Plan:   1.  Lower GI bleeding: Recurrent (AVM/ischemic bowel) with no overt loss at this time after surgical enteroscopy with 4 clips at [facility].  Hemodynamically stable with stable hemoglobin and hematocrit overnight. 2. Left knee septic arthritis status post surgical washout: Surgical scar appearing to heal well and currently on intravenous cefazolin with ongoing physical therapy/Occupational Therapy. 3. End-stage renal disease: Continue current hemodialysis schedule (MWFSa) with efforts to try and lower dry weight safely.  4. Hypertension/volume: Continues to have edema on physical exam, continue aggressive UF as tolerated 5. Anemia of ESRD: With history of recurrent GI bleed in the setting of end-stage renal disease on hemodialysis-status post PRBC transfusions and will continue ESA/IV iron 6. Metabolic bone disease: Phosphorus currently controlled off of binders, continue VDRA for PTH control  7. 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 8. T2DM 9. OSA  Subjective:   Hypoglycemia noted overnight-patient reports decreased appetite may have led to this.  Able to ambulate with walker during physical therapy.   Objective:   BP 94/80 (BP Location: Right Arm)   Pulse 78   Temp 98.1 F (36.7 C) (Oral)   Resp (!) 8   Ht 5\' 5"  (1.651 m)   Wt 75.8 kg (167 lb 1.7 oz)   SpO2 96%   BMI 27.81 kg/m   Physical Exam: Gen: Appears to be comfortable resting in bed CVS: Pulse regular rhythm, normal rate, LVAD hum audible over precordium Resp: Clear to auscultation bilaterally, no rales/rhonchi Abd: Soft, obese, nontender Ext: 2+-3+ pitting edema.  Left knee surgical scar healing well, left upper arm aVF with audible bruit  Labs: BMET Recent Labs  Lab 05/01/18 0425 05/02/18 1346 05/03/18 0804 05/04/18 0624  NA 136 135 136 131*   K 3.7 3.2* 4.0 4.3  CL 100 98 96* 93*  CO2 26 27 30 29   GLUCOSE 144* 104* 169* 169*  BUN 19 12 7* 15  CREATININE 6.09* 4.61* 3.33* 4.57*  CALCIUM 7.7* 7.3* 7.5* 7.4*   CBC Recent Labs  Lab 05/01/18 0425 05/02/18 1346 05/03/18 0804 05/04/18 0624  WBC 14.2* 14.0* 13.9* 15.1*  HGB 9.5* 9.1* 9.7* 9.3*  HCT 31.6* 30.1* 32.5* 31.1*  MCV 96.0 95.9 96.2 97.2  PLT 147* 154 175 149*   Medications:    . atorvastatin  40 mg Oral q1800  . budesonide  0.25 mg Nebulization BID  . busPIRone  5 mg Oral BID  . calcitRIOL  0.5 mcg Oral Q M,W,F  . Chlorhexidine Gluconate Cloth  6 each Topical Q0600  . darbepoetin (ARANESP) injection - DIALYSIS  100 mcg Intravenous Q Mon-HD  . insulin aspart  0-15 Units Subcutaneous TID WC  . insulin aspart  0-5 Units Subcutaneous QHS  . insulin aspart  3 Units Subcutaneous TID WC  . levothyroxine  25 mcg Oral QAC breakfast  . pantoprazole (PROTONIX) IV  40 mg Intravenous Q12H  . senna-docusate  1 tablet Oral BID  . sildenafil  20 mg Oral TID  . Warfarin - Pharmacist Dosing Inpatient   Does not apply T4196   Elmarie Shiley, MD 05/04/2018, 8:13 AM

## 2018-05-04 NOTE — Progress Notes (Addendum)
Advanced Heart Failure VAD Team Note  Subjective:    Weight down 2 lbs. Will have HD today.   INR 3.0 > 2.3. Heparin off. Coumadin held. No bleeding.    LDH 260, Hgb 9.5-> 9.1 -> 9.3.   WBC trending back up 13.9 > 15.1. Afebrile. Denies fever/chills.   Able to walk around the room a bit with PT/OT yesterday. Left knee doing okay. Had some nausea after eating yesterday, but none since. Denies orthopnea, PND.   LVAD INTERROGATION:  HeartMate-2 LVAD:   Flow 4.4 liters/min, speed 9200, power 5, PI 5.8   VAD interrogated personally. Parameters stable.  Objective:    Vital Signs:   Temp:  [97.3 F (36.3 C)-98.2 F (36.8 C)] 98.1 F (36.7 C) (07/01 0812) Pulse Rate:  [78] 78 (06/30 2020) Resp:  [8-13] 8 (07/01 0800) BP: (85-109)/(69-94) 94/80 (07/01 0327) SpO2:  [95 %-96 %] 96 % (07/01 0800) Weight:  [167 lb 1.7 oz (75.8 kg)-169 lb 8.5 oz (76.9 kg)] 167 lb 1.7 oz (75.8 kg) (07/01 0500) Last BM Date: 04/30/18 Mean arterial Pressure 80s  Intake/Output:   Intake/Output Summary (Last 24 hours) at 05/04/2018 0847 Last data filed at 05/04/2018 0100 Gross per 24 hour  Intake 1160 ml  Output 250 ml  Net 910 ml     Physical Exam    General: NAD. Sitting up in chair  HEENT: Normal.Anicteric  Neck: Supple, No JVD. Carotids OK.  Cardiac:  Mechanical heart sounds with LVAD hum present.  Lungs:  CTAB, normal effort. No wheeze  Abdomen:  NT, ND, no HSM. No bruits or masses. +BS  LVAD exit site:  Dressing dry and intact. Stabilization device present and accurately applied.  Extremities:  Warm and dry. No cyanosis, clubbing, rash, BLE 1-2+ edema, L>R, left knee surgical site pink margins, no drainage. Sutures in place.  Neuro: alert & oriented x 3, cranial nerves grossly intact. moves all 4 extremities w/o difficulty. Affect pleasant    Telemetry   Afib 70s. 6 beats and 7 beats NSVT. Personally reviewed.   Labs   Basic Metabolic Panel: Recent Labs  Lab 05/01/18 0425  05/02/18 1346 05/03/18 0804 05/04/18 0624  NA 136 135 136 131*  K 3.7 3.2* 4.0 4.3  CL 100 98 96* 93*  CO2 26 27 30 29   GLUCOSE 144* 104* 169* 169*  BUN 19 12 7* 15  CREATININE 6.09* 4.61* 3.33* 4.57*  CALCIUM 7.7* 7.3* 7.5* 7.4*  MG 1.5* 2.4  --   --     Liver Function Tests: No results for input(s): AST, ALT, ALKPHOS, BILITOT, PROT, ALBUMIN in the last 168 hours. No results for input(s): LIPASE, AMYLASE in the last 168 hours. No results for input(s): AMMONIA in the last 168 hours.  CBC: Recent Labs  Lab 04/30/18 1651 05/01/18 0425 05/02/18 1346 05/03/18 0804 05/04/18 0624  WBC 16.1* 14.2* 14.0* 13.9* 15.1*  HGB 9.7* 9.5* 9.1* 9.7* 9.3*  HCT 32.8* 31.6* 30.1* 32.5* 31.1*  MCV 96.5 96.0 95.9 96.2 97.2  PLT 181 147* 154 175 149*    INR: Recent Labs  Lab 05/01/18 0425 05/02/18 1346 05/02/18 1704 05/03/18 0804 05/04/18 0624  INR 1.54 4.51* 4.71* 3.08 2.32    Other results:  Imaging   No results found.   Medications:     Scheduled Medications: . atorvastatin  40 mg Oral q1800  . budesonide  0.25 mg Nebulization BID  . busPIRone  5 mg Oral BID  . calcitRIOL  0.5 mcg Oral Q  M,W,F  . Chlorhexidine Gluconate Cloth  6 each Topical Q0600  . darbepoetin (ARANESP) injection - DIALYSIS  100 mcg Intravenous Q Mon-HD  . insulin aspart  0-15 Units Subcutaneous TID WC  . insulin aspart  0-5 Units Subcutaneous QHS  . insulin aspart  3 Units Subcutaneous TID WC  . levothyroxine  25 mcg Oral QAC breakfast  . pantoprazole (PROTONIX) IV  40 mg Intravenous Q12H  . senna-docusate  1 tablet Oral BID  . sildenafil  20 mg Oral TID  . Warfarin - Pharmacist Dosing Inpatient   Does not apply q1800    Infusions: . sodium chloride    . sodium chloride    .  ceFAZolin (ANCEF) IV 1 g (05/03/18 2022)    PRN Medications: sodium chloride, sodium chloride, acetaminophen, albuterol, benzonatate, lidocaine (PF), lidocaine-prilocaine, ondansetron (ZOFRAN) IV, oxyCODONE,  pentafluoroprop-tetrafluoroeth, traMADol   Patient Profile   Trevor Carreira Murphyis a 72 y.o.malewith a history of CAD s/p CABG x 4 2010, OSA, AS with TAVR 2015, ,ESRD on HD,DM2,chronicatrial fibrillation, asthma,GI bleed,and ischemic cardiomyopathy with Medtronic ICD.S/PHMII LVAD for Destination therapy on 12/14/2015.  Transferred back from [place] on 6/27 after undergoing enterotomy and clipping of a colonic Dueilafoy lesion as well I&D of septic left knee.   Assessment/Plan:    1. GI Bleed/Symptomatic Anemia: GI AVMs. Double balloon retro endoscopy at [facility], unable to identify bleeding lesion. He failed attempted IR embolization x 3 while at [facility]. 12 units PRBCs at [facility]. Received 15 uPRBC while at [facility]. IR consulted, but did not find any active bleeding on CTA 5/20. Repeat capsule study showed active bleeding in the mid ileum. Surgery was reconsulted and recommended transfer back to [facility] for possible surgery.  - Now s/p laparoscopic surgical enteroscopy with 4 clips on 6/7 at [facility] - C/b ischemic bowel/SBO. Now tolerating full diet if he eats in small quantities. Had some nausea yesterday after eating. No BM yesterday.  - Heparin/coumadin restarted 6/27. INR at 2.3 this am. No bleeding. Heparin off. Discussed dosing with PharmD personally.  - Hgb stable at 9.3  2. 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 S/p HM II LVAD placed 2017 for DT. Has Medtronic ICD.  - Volume status much improved after 2 sessions of HD. Resume MWF schedule - VAD interrogated personally. Parameters stable. - Volume managed by HD. No change. HD today.  - No bb with RV failure.   - Continue sildenafil 40 mg tid.   3. VAD, HM-II - VAD interrogated personally. Parameters stable. - LDH 282 - INR 2.3 (Goal 1.8-2.2). Coumadin dosing per pharmacy.  - No ASA with GIB  4. ESRD- M/W/F/Sat.  - HD per Renal. Has had 2 sessions in past 2 days. Switch back to MWF schedule - Appreciate Renal's help. No  change.   5. Chronic A fib: - Rate controlled.No change.   6. HTN:  - MAPs 80s  7. Septic left knee - s/p several aspirations and OR wash out x2. Aspirate cultures positive for staph epi and clostridium perfinge. Blood cultures negative at [facility].  - On Ancef through 7/2 - Ortho saw 6/28  Stitches out early this week - WBC trending up 15, Afebrile.   8. DM II - SSI. No change.   9. Hx of CAD s/p CABG. - No s/s ischemia - Continue statin  9. Hx of TAVR:  - Valve stable on last echo 12/2017. No change.   10. Deconditioning - PT/OT  & CIR are following - Will likely need  CIR. No change.   11. Hypomagnesemia - Resolved. 2.4 yesterday.   I reviewed the LVAD parameters from today, and compared the results to the patient's prior recorded data.  No programming changes were made.  The LVAD is functioning within specified parameters.  The patient performs LVAD self-test daily.  LVAD interrogation was negative for any significant power changes, alarms or PI events/speed drops.  LVAD equipment check completed and is in good working order.  Back-up equipment present.   LVAD education done on emergency procedures and precautions and reviewed exit site care.  Length of Stay: [place], NP 05/04/2018, 8:47 AM  VAD Team --- VAD ISSUES ONLY--- Pager [phone] (7am - 7am)  Advanced Heart Failure Team  Pager [phone] (M-F; 7a - 4p)  Please contact [facility] for night-coverage after hours (4p -7a ) and weekends on [email or web address]  He is doing well. Mildly volume overloaded but gets HD today. INR 2.3. No further bleeding Left knee looks good. Sutures out today. Should be ready for CIR in next day or two. VAD interrogated personally. Parameters stable.  Glori Bickers, MD  3:03 PM

## 2018-05-05 ENCOUNTER — Inpatient Hospital Stay (HOSPITAL_COMMUNITY)
Admission: RE | Admit: 2018-05-05 | Discharge: 2018-05-19 | DRG: 949 | Disposition: A | Discharge status: Home Health | Payer: Medicare Other | Source: Intra-hospital | Attending: Physical Medicine & Rehabilitation | Admitting: Physical Medicine & Rehabilitation

## 2018-05-05 DIAGNOSIS — G479 Sleep disorder, unspecified: Secondary | ICD-10-CM

## 2018-05-05 DIAGNOSIS — Z9889 Other specified postprocedural states: Secondary | ICD-10-CM

## 2018-05-05 DIAGNOSIS — Z4789 Encounter for other orthopedic aftercare: Secondary | ICD-10-CM | POA: Diagnosis not present

## 2018-05-05 DIAGNOSIS — M25562 Pain in left knee: Secondary | ICD-10-CM | POA: Diagnosis present

## 2018-05-05 DIAGNOSIS — R791 Abnormal coagulation profile: Secondary | ICD-10-CM | POA: Diagnosis not present

## 2018-05-05 DIAGNOSIS — K5521 Angiodysplasia of colon with hemorrhage: Secondary | ICD-10-CM | POA: Diagnosis present

## 2018-05-05 DIAGNOSIS — D62 Acute posthemorrhagic anemia: Secondary | ICD-10-CM

## 2018-05-05 DIAGNOSIS — I2510 Atherosclerotic heart disease of native coronary artery without angina pectoris: Secondary | ICD-10-CM | POA: Diagnosis present

## 2018-05-05 DIAGNOSIS — I953 Hypotension of hemodialysis: Secondary | ICD-10-CM | POA: Diagnosis not present

## 2018-05-05 DIAGNOSIS — F419 Anxiety disorder, unspecified: Secondary | ICD-10-CM | POA: Diagnosis present

## 2018-05-05 DIAGNOSIS — I482 Chronic atrial fibrillation: Secondary | ICD-10-CM | POA: Diagnosis present

## 2018-05-05 DIAGNOSIS — N186 End stage renal disease: Secondary | ICD-10-CM

## 2018-05-05 DIAGNOSIS — R059 Cough, unspecified: Secondary | ICD-10-CM

## 2018-05-05 DIAGNOSIS — M009 Pyogenic arthritis, unspecified: Secondary | ICD-10-CM

## 2018-05-05 DIAGNOSIS — I132 Hypertensive heart and chronic kidney disease with heart failure and with stage 5 chronic kidney disease, or end stage renal disease: Secondary | ICD-10-CM | POA: Diagnosis present

## 2018-05-05 DIAGNOSIS — I959 Hypotension, unspecified: Secondary | ICD-10-CM | POA: Diagnosis present

## 2018-05-05 DIAGNOSIS — I5022 Chronic systolic (congestive) heart failure: Secondary | ICD-10-CM | POA: Diagnosis present

## 2018-05-05 DIAGNOSIS — G4733 Obstructive sleep apnea (adult) (pediatric): Secondary | ICD-10-CM | POA: Diagnosis present

## 2018-05-05 DIAGNOSIS — D72829 Elevated white blood cell count, unspecified: Secondary | ICD-10-CM | POA: Diagnosis not present

## 2018-05-05 DIAGNOSIS — R5381 Other malaise: Secondary | ICD-10-CM | POA: Diagnosis present

## 2018-05-05 DIAGNOSIS — Z794 Long term (current) use of insulin: Secondary | ICD-10-CM

## 2018-05-05 DIAGNOSIS — Z833 Family history of diabetes mellitus: Secondary | ICD-10-CM

## 2018-05-05 DIAGNOSIS — J45909 Unspecified asthma, uncomplicated: Secondary | ICD-10-CM | POA: Diagnosis present

## 2018-05-05 DIAGNOSIS — Z48815 Encounter for surgical aftercare following surgery on the digestive system: Secondary | ICD-10-CM | POA: Diagnosis present

## 2018-05-05 DIAGNOSIS — Z8719 Personal history of other diseases of the digestive system: Secondary | ICD-10-CM | POA: Diagnosis not present

## 2018-05-05 DIAGNOSIS — R42 Dizziness and giddiness: Secondary | ICD-10-CM | POA: Diagnosis not present

## 2018-05-05 DIAGNOSIS — F411 Generalized anxiety disorder: Secondary | ICD-10-CM

## 2018-05-05 DIAGNOSIS — G4700 Insomnia, unspecified: Secondary | ICD-10-CM | POA: Diagnosis present

## 2018-05-05 DIAGNOSIS — R05 Cough: Secondary | ICD-10-CM

## 2018-05-05 DIAGNOSIS — R634 Abnormal weight loss: Secondary | ICD-10-CM | POA: Diagnosis present

## 2018-05-05 DIAGNOSIS — Z992 Dependence on renal dialysis: Secondary | ICD-10-CM

## 2018-05-05 DIAGNOSIS — Z5320 Procedure and treatment not carried out because of patient's decision for unspecified reasons: Secondary | ICD-10-CM | POA: Diagnosis not present

## 2018-05-05 DIAGNOSIS — Z95811 Presence of heart assist device: Secondary | ICD-10-CM

## 2018-05-05 DIAGNOSIS — R252 Cramp and spasm: Secondary | ICD-10-CM | POA: Diagnosis not present

## 2018-05-05 DIAGNOSIS — Z95810 Presence of automatic (implantable) cardiac defibrillator: Secondary | ICD-10-CM

## 2018-05-05 DIAGNOSIS — E1122 Type 2 diabetes mellitus with diabetic chronic kidney disease: Secondary | ICD-10-CM | POA: Diagnosis present

## 2018-05-05 DIAGNOSIS — Z951 Presence of aortocoronary bypass graft: Secondary | ICD-10-CM | POA: Diagnosis not present

## 2018-05-05 DIAGNOSIS — I255 Ischemic cardiomyopathy: Secondary | ICD-10-CM | POA: Diagnosis present

## 2018-05-05 DIAGNOSIS — Z8249 Family history of ischemic heart disease and other diseases of the circulatory system: Secondary | ICD-10-CM | POA: Diagnosis not present

## 2018-05-05 DIAGNOSIS — E8889 Other specified metabolic disorders: Secondary | ICD-10-CM | POA: Diagnosis present

## 2018-05-05 DIAGNOSIS — D631 Anemia in chronic kidney disease: Secondary | ICD-10-CM | POA: Diagnosis present

## 2018-05-05 DIAGNOSIS — I25810 Atherosclerosis of coronary artery bypass graft(s) without angina pectoris: Secondary | ICD-10-CM

## 2018-05-05 DIAGNOSIS — E119 Type 2 diabetes mellitus without complications: Secondary | ICD-10-CM | POA: Diagnosis not present

## 2018-05-05 DIAGNOSIS — Z952 Presence of prosthetic heart valve: Secondary | ICD-10-CM

## 2018-05-05 DIAGNOSIS — I9589 Other hypotension: Secondary | ICD-10-CM | POA: Diagnosis not present

## 2018-05-05 LAB — GLUCOSE, CAPILLARY
GLUCOSE-CAPILLARY: 150 mg/dL — AB (ref 70–99)
GLUCOSE-CAPILLARY: 78 mg/dL (ref 70–99)
GLUCOSE-CAPILLARY: 154 mg/dL — AB (ref 70–99)
Glucose-Capillary: 95 mg/dL (ref 70–99)
Glucose-Capillary: 58 mg/dL — ABNORMAL LOW (ref 70–99)
Glucose-Capillary: 106 mg/dL — ABNORMAL HIGH (ref 70–99)

## 2018-05-05 LAB — CBC
HCT: 31.8 % — ABNORMAL LOW (ref 39.0–52.0)
HEMOGLOBIN: 9.7 g/dL — AB (ref 13.0–17.0)
MCH: 29.5 pg (ref 26.0–34.0)
MCHC: 30.5 g/dL (ref 30.0–36.0)
MCV: 96.7 fL (ref 78.0–100.0)
Platelets: 153 10*3/uL (ref 150–400)
RBC: 3.29 MIL/uL — AB (ref 4.22–5.81)
RDW: 20.1 % — ABNORMAL HIGH (ref 11.5–15.5)
WBC: 12 10*3/uL — AB (ref 4.0–10.5)

## 2018-05-05 LAB — PROTIME-INR
INR: 1.99
Prothrombin Time: 22.5 seconds — ABNORMAL HIGH (ref 11.4–15.2)

## 2018-05-05 LAB — BASIC METABOLIC PANEL
ANION GAP: 8 (ref 5–15)
BUN: 7 mg/dL — ABNORMAL LOW (ref 8–23)
CALCIUM: 7.5 mg/dL — AB (ref 8.9–10.3)
CHLORIDE: 96 mmol/L — AB (ref 98–111)
CO2: 31 mmol/L (ref 22–32)
Creatinine, Ser: 3.29 mg/dL — ABNORMAL HIGH (ref 0.61–1.24)
GFR calc non Af Amer: 17 mL/min — ABNORMAL LOW (ref >60)
GFR, EST AFRICAN AMERICAN: 20 mL/min — AB (ref >60)
Glucose, Bld: 150 mg/dL — ABNORMAL HIGH (ref 70–99)
Potassium: 4.2 mmol/L (ref 3.5–5.1)
Sodium: 135 mmol/L (ref 135–145)

## 2018-05-05 LAB — LACTATE DEHYDROGENASE: LDH: 251 U/L — AB (ref 98–192)

## 2018-05-05 MED ORDER — SILDENAFIL CITRATE 20 MG PO TABS: 20.0000 mg | ORAL_TABLET | Freq: Three times a day (TID) | ORAL | 0 refills | Status: DC

## 2018-05-05 MED ORDER — LIDOCAINE-PRILOCAINE 2.5-2.5 % EX CREA
1.0000 "application " | TOPICAL_CREAM | CUTANEOUS | Status: DC | PRN
  Filled 2018-05-05: qty 5

## 2018-05-05 MED ORDER — INSULIN ASPART 100 UNIT/ML ~~LOC~~ SOLN
0.0000 [IU] | Freq: Three times a day (TID) | SUBCUTANEOUS | Status: DC
  Administered 2018-05-06: 3 [IU] via SUBCUTANEOUS
  Administered 2018-05-07: 2 [IU] via SUBCUTANEOUS
  Administered 2018-05-07: 3 [IU] via SUBCUTANEOUS
  Administered 2018-05-07: 2 [IU] via SUBCUTANEOUS
  Administered 2018-05-08: 5 [IU] via SUBCUTANEOUS
  Administered 2018-05-08 – 2018-05-09 (×3): 3 [IU] via SUBCUTANEOUS
  Administered 2018-05-10: 2 [IU] via SUBCUTANEOUS
  Administered 2018-05-10 – 2018-05-11 (×2): 3 [IU] via SUBCUTANEOUS
  Administered 2018-05-11: 2 [IU] via SUBCUTANEOUS
  Administered 2018-05-12: 3 [IU] via SUBCUTANEOUS
  Administered 2018-05-12 – 2018-05-13 (×3): 2 [IU] via SUBCUTANEOUS
  Administered 2018-05-14: 3 [IU] via SUBCUTANEOUS
  Administered 2018-05-14: 5 [IU] via SUBCUTANEOUS
  Administered 2018-05-15: 3 [IU] via SUBCUTANEOUS
  Administered 2018-05-16: 5 [IU] via SUBCUTANEOUS
  Administered 2018-05-17 (×2): 2 [IU] via SUBCUTANEOUS

## 2018-05-05 MED ORDER — CALCITRIOL 0.5 MCG PO CAPS
0.5000 ug | ORAL_CAPSULE | ORAL | Status: DC
  Administered 2018-05-06 – 2018-05-18 (×5): 0.5 ug via ORAL
  Filled 2018-05-05 (×5): qty 1

## 2018-05-05 MED ORDER — ALBUTEROL SULFATE (2.5 MG/3ML) 0.083% IN NEBU
2.5000 mg | INHALATION_SOLUTION | RESPIRATORY_TRACT | Status: DC | PRN
  Administered 2018-05-14 – 2018-05-17 (×3): 2.5 mg via RESPIRATORY_TRACT
  Filled 2018-05-05 (×3): qty 3

## 2018-05-05 MED ORDER — WARFARIN SODIUM 3 MG PO TABS: 3.0000 mg | ORAL_TABLET | Freq: Once | ORAL | Status: DC

## 2018-05-05 MED ORDER — SENNOSIDES-DOCUSATE SODIUM 8.6-50 MG PO TABS
1.0000 | ORAL_TABLET | Freq: Two times a day (BID) | ORAL | Status: DC
  Administered 2018-05-05 – 2018-05-18 (×16): 1 via ORAL
  Filled 2018-05-05 (×26): qty 1

## 2018-05-05 MED ORDER — TRAZODONE HCL 50 MG PO TABS: 50.0000 mg | ORAL_TABLET | Freq: Every evening | ORAL | Status: DC | PRN

## 2018-05-05 MED ORDER — SILDENAFIL CITRATE 20 MG PO TABS
20.0000 mg | ORAL_TABLET | Freq: Three times a day (TID) | ORAL | Status: DC
  Administered 2018-05-05 – 2018-05-19 (×32): 20 mg via ORAL
  Filled 2018-05-05 (×45): qty 1

## 2018-05-05 MED ORDER — RENA-VITE PO TABS
1.0000 | ORAL_TABLET | Freq: Every day | ORAL | Status: DC
  Administered 2018-05-05 – 2018-05-18 (×14): 1 via ORAL
  Filled 2018-05-05 (×14): qty 1

## 2018-05-05 MED ORDER — BENZONATATE 100 MG PO CAPS: 100.0000 mg | ORAL_CAPSULE | Freq: Three times a day (TID) | ORAL | 0 refills | Status: DC | PRN

## 2018-05-05 MED ORDER — BUSPIRONE HCL 5 MG PO TABS
5.0000 mg | ORAL_TABLET | Freq: Two times a day (BID) | ORAL | Status: DC
  Administered 2018-05-05 – 2018-05-19 (×28): 5 mg via ORAL
  Filled 2018-05-05 (×28): qty 1

## 2018-05-05 MED ORDER — NEPRO/CARBSTEADY PO LIQD: 237.0000 mL | Freq: Two times a day (BID) | ORAL | Status: DC

## 2018-05-05 MED ORDER — BISACODYL 10 MG RE SUPP: 10.0000 mg | Freq: Every day | RECTAL | Status: DC | PRN

## 2018-05-05 MED ORDER — GUAIFENESIN-DM 100-10 MG/5ML PO SYRP: 5.0000 mL | ORAL_SOLUTION | Freq: Four times a day (QID) | ORAL | Status: DC | PRN

## 2018-05-05 MED ORDER — ONDANSETRON HCL 4 MG/2ML IJ SOLN: 4.0000 mg | Freq: Four times a day (QID) | INTRAMUSCULAR | Status: DC | PRN

## 2018-05-05 MED ORDER — TRAZODONE HCL 50 MG PO TABS
25.0000 mg | ORAL_TABLET | Freq: Every evening | ORAL | Status: DC | PRN
  Administered 2018-05-05: 25 mg via ORAL
  Filled 2018-05-05: qty 1

## 2018-05-05 MED ORDER — WARFARIN SODIUM 3 MG PO TABS
3.0000 mg | ORAL_TABLET | Freq: Once | ORAL | Status: DC
  Administered 2018-05-05: 3 mg via ORAL
  Filled 2018-05-05: qty 1

## 2018-05-05 MED ORDER — INSULIN ASPART 100 UNIT/ML ~~LOC~~ SOLN
0.0000 [IU] | Freq: Every day | SUBCUTANEOUS | Status: DC
  Administered 2018-05-08 – 2018-05-12 (×5): 2 [IU] via SUBCUTANEOUS

## 2018-05-05 MED ORDER — ALUMINUM HYDROXIDE GEL 320 MG/5ML PO SUSP
10.0000 mL | Freq: Four times a day (QID) | ORAL | Status: DC | PRN
  Filled 2018-05-05: qty 30

## 2018-05-05 MED ORDER — POLYETHYLENE GLYCOL 3350 17 G PO PACK: 17.0000 g | PACK | Freq: Every day | ORAL | Status: DC | PRN

## 2018-05-05 MED ORDER — NEPRO/CARBSTEADY PO LIQD
237.0000 mL | Freq: Two times a day (BID) | ORAL | Status: DC
  Administered 2018-05-06 – 2018-05-17 (×19): 237 mL via ORAL

## 2018-05-05 MED ORDER — INSULIN ASPART 100 UNIT/ML ~~LOC~~ SOLN
3.0000 [IU] | Freq: Three times a day (TID) | SUBCUTANEOUS | Status: DC
  Administered 2018-05-06 – 2018-05-17 (×20): 3 [IU] via SUBCUTANEOUS

## 2018-05-05 MED ORDER — DARBEPOETIN ALFA 100 MCG/0.5ML IJ SOSY
100.0000 ug | PREFILLED_SYRINGE | INTRAMUSCULAR | Status: DC
  Filled 2018-05-05 (×2): qty 0.5

## 2018-05-05 MED ORDER — BUDESONIDE 0.25 MG/2ML IN SUSP
0.2500 mg | Freq: Two times a day (BID) | RESPIRATORY_TRACT | Status: DC
  Administered 2018-05-05 – 2018-05-19 (×26): 0.25 mg via RESPIRATORY_TRACT
  Filled 2018-05-05 (×30): qty 2

## 2018-05-05 MED ORDER — WARFARIN - PHARMACIST DOSING INPATIENT
Freq: Every day | Status: DC
  Administered 2018-05-07 – 2018-05-12 (×4)

## 2018-05-05 MED ORDER — TRAMADOL HCL 50 MG PO TABS: 100.0000 mg | ORAL_TABLET | Freq: Three times a day (TID) | ORAL | Status: DC | PRN

## 2018-05-05 MED ORDER — ATORVASTATIN CALCIUM 40 MG PO TABS
40.0000 mg | ORAL_TABLET | Freq: Every day | ORAL | Status: DC
  Administered 2018-05-06 – 2018-05-18 (×13): 40 mg via ORAL
  Filled 2018-05-05 (×13): qty 1

## 2018-05-05 MED ORDER — ACETAMINOPHEN 325 MG PO TABS
650.0000 mg | ORAL_TABLET | ORAL | Status: DC | PRN
  Administered 2018-05-07 – 2018-05-18 (×7): 650 mg via ORAL
  Filled 2018-05-05 (×7): qty 2

## 2018-05-05 MED ORDER — ACETAMINOPHEN 325 MG PO TABS: 325.0000 mg | ORAL_TABLET | ORAL | Status: DC | PRN

## 2018-05-05 MED ORDER — OXYCODONE HCL 5 MG PO TABS
5.0000 mg | ORAL_TABLET | ORAL | Status: DC | PRN
  Administered 2018-05-06 – 2018-05-15 (×24): 5 mg via ORAL
  Filled 2018-05-05 (×27): qty 1

## 2018-05-05 MED ORDER — SENNOSIDES-DOCUSATE SODIUM 8.6-50 MG PO TABS: 1.0000 | ORAL_TABLET | Freq: Two times a day (BID) | ORAL | Status: DC

## 2018-05-05 MED ORDER — PANTOPRAZOLE SODIUM 40 MG PO TBEC: 40.0000 mg | DELAYED_RELEASE_TABLET | Freq: Two times a day (BID) | ORAL | 5 refills | Status: DC

## 2018-05-05 MED ORDER — LEVOTHYROXINE SODIUM 50 MCG PO TABS
25.0000 ug | ORAL_TABLET | Freq: Every day | ORAL | Status: DC
  Administered 2018-05-06 – 2018-05-19 (×14): 25 ug via ORAL
  Filled 2018-05-05 (×14): qty 1

## 2018-05-05 MED ORDER — DIPHENHYDRAMINE HCL 12.5 MG/5ML PO ELIX: 12.5000 mg | ORAL_SOLUTION | Freq: Four times a day (QID) | ORAL | Status: DC | PRN

## 2018-05-05 MED ORDER — OXYCODONE HCL 5 MG PO TABS: 5.0000 mg | ORAL_TABLET | ORAL | 0 refills | Status: DC | PRN

## 2018-05-05 MED ORDER — CHLORHEXIDINE GLUCONATE CLOTH 2 % EX PADS: 6.0000 | MEDICATED_PAD | Freq: Every day | CUTANEOUS | Status: DC

## 2018-05-05 MED ORDER — PANTOPRAZOLE SODIUM 40 MG PO TBEC
40.0000 mg | DELAYED_RELEASE_TABLET | Freq: Two times a day (BID) | ORAL | Status: DC
  Administered 2018-05-05 – 2018-05-19 (×28): 40 mg via ORAL
  Filled 2018-05-05 (×28): qty 1

## 2018-05-05 MED ORDER — LIDOCAINE HCL (PF) 1 % IJ SOLN
5.0000 mL | INTRAMUSCULAR | Status: DC | PRN
  Filled 2018-05-05: qty 5

## 2018-05-05 MED ORDER — BENZONATATE 100 MG PO CAPS
100.0000 mg | ORAL_CAPSULE | Freq: Three times a day (TID) | ORAL | Status: DC | PRN
  Filled 2018-05-05 (×2): qty 1

## 2018-05-05 MED ORDER — TRAMADOL HCL 50 MG PO TABS
100.0000 mg | ORAL_TABLET | Freq: Two times a day (BID) | ORAL | Status: DC | PRN
  Administered 2018-05-05 – 2018-05-17 (×10): 100 mg via ORAL
  Filled 2018-05-05 (×10): qty 2

## 2018-05-05 NOTE — Progress Notes (Addendum)
BG 106; will transport pt to 4W16.   [place]  Shanitra Phillippi, RN

## 2018-05-05 NOTE — Progress Notes (Signed)
Patient arrived to floor with nursing staff. He was brought up with his LVAD machine and hooked up. Patient is complaining of lt knee pain and rating pain 8/10.

## 2018-05-05 NOTE — Progress Notes (Signed)
ANTICOAGULATION CONSULT NOTE  Pharmacy Consult for warfarin Indication: LVAD  Patient Measurements: Height: 5\' 5"  (165.1 cm) Weight: 163 lb 9.3 oz (74.2 kg) IBW/kg (Calculated) : 61.5 Heparin Dosing Weight: 79.5 kg  Vital Signs: Temp: 97.8 F (36.6 C) (07/02 0700) Temp Source: Oral (07/02 0700) BP: 93/71 (07/02 0745) Pulse Rate: 40 (07/02 0755)  Labs: Recent Labs    05/02/18 1346  05/03/18 0804 05/04/18 0624 05/05/18 0644  HGB 9.1*  --  9.7* 9.3* 9.7*  HCT 30.1*  --  32.5* 31.1* 31.8*  PLT 154  --  175 149* 153  LABPROT 42.5*   < > 31.5* 25.2* 22.5*  INR 4.51*   < > 3.08 2.32 1.99  HEPARINUNFRC 0.30  --   --   --   --   CREATININE 4.61*  --  3.33* 4.57* 3.29*   < > = values in this interval not displayed.    Medical History: Past Medical History:  Diagnosis Date  . AICD (automatic cardioverter/defibrillator) present   . Asthma   . AVM (arteriovenous malformation) of colon 07/07/2017  . CHF (congestive heart failure) ([place])   . Diabetes ([place])   . ESRF (end stage renal failure) ([place]) 07/07/2017   pt receiving hemodialysis Monday- Wednesday-friday, sometimes Saturday  . Kidney disease   . LVAD (left ventricular assist device) present ([place]) 12/2016  . Permanent atrial fibrillation ([place]) 07/07/2017  . Presence of permanent cardiac pacemaker    AICD  . Sleep apnea     Assessment: 67 yom who underwent HM2 LVAD implantation on 12/14/2015. Has undergoing extensive GI workup to evaluate bleeding. Was stopped on anticoagulants as of admission on 03/09/2018. Last outpatient regimen was (as of 02/17/2018): 7.5 mg daily except 10 mg on Tues/Thurs for goal 2-2.5. Goal INR was decreased to 1.8-2.3 after admission on 02/28/2018. Now s/p laparoscopic surgical enteroscopy with 4 clips on 04/10/2018.  Heparin and warfarin were resumed on 6/27 >> now off heparin infusion. Patient had only received two doses of warfarin prior to INR unexpectedly jumping (peaked at 4.71)- now drifting down to  1.99, within goal range. Received reduced dose of 2.5 mg yesterday. Hgb 9.7, plt 153, LDH stable at 251. No s/sx of bleeding.   Goal of Therapy:  INR goal: 1.8-2.2 Monitor platelets by anticoagulation protocol: Yes   Plan:  -Order warfarin 3 mg tonight  -Daily INR -Monitor s/s bleeding  Doylene Canard, PharmD Clinical Pharmacist  Pager: [phone] Phone: [phone] 05/05/2018 11:00 AM

## 2018-05-05 NOTE — Progress Notes (Signed)
Physical Medicine and Rehabilitation Consult  Reason for Consult: Decreased functional mobility  Referring Physician: [name]  HPI: Trevor [name] is a 72 y.o. right-handed male with complex medical history of CAD with CABG in 2010, OSA, aortic stenosis with TAVR 2015, end-stage renal disease with hemodialysis, diabetes mellitus, chronic atrial fibrillation on Coumadin in the past and recently resumed, recurrent GI bleed with follow-up treatment at [facility] and has received multiple transfusions and recently with double-balloon retro-endoscopy, ischemic cardiomyopathy with [employer] ICD status post LVAD 12/14/2015. Per chart review patient lives with sister. Had been independent prior to latest extended hospital admission. One level home with one-step to entry. Sister is physically limited. Presented 04/30/2018 after being at [facility] for an extended time due to GI bleed and return back to [facility] for ongoing management with follow-up by cardiology services as well as renal services for end-stage renal disease. MRSA contact precautions. Patient noted to be deconditioned with physical and Occupational Therapy evaluations pending. MD has requested physical medicine rehab consult.  Review of Systems  Constitutional: Positive for malaise/fatigue. Negative for chills and fever.  HENT: Negative for hearing loss.  Eyes: Negative for blurred vision and double vision.  Respiratory: Positive for cough and shortness of breath.  Cardiovascular: Positive for leg swelling.  Gastrointestinal: Positive for constipation and nausea. Negative for vomiting.  Genitourinary: Negative for flank pain and hematuria.  Musculoskeletal: Positive for joint pain and myalgias.  Skin: Negative for rash.  All other systems reviewed and are negative.       Past Medical History:  Diagnosis Date  . AICD (automatic cardioverter/defibrillator) present   . Asthma   . AVM (arteriovenous malformation) of  colon 07/07/2017  . CHF (congestive heart failure) ([place])   . Diabetes ([place])   . ESRF (end stage renal failure) ([place]) 07/07/2017   pt receiving hemodialysis Monday- Wednesday-friday, sometimes Saturday  . Kidney disease   . LVAD (left ventricular assist device) present ([place]) 12/2016  . Permanent atrial fibrillation ([place]) 07/07/2017  . Presence of permanent cardiac pacemaker    AICD  . Sleep apnea         Past Surgical History:  Procedure Laterality Date  . AORTIC VALVE REPLACEMENT  2015   [place]  . APPLICATION OF A-CELL OF EXTREMITY Left 11/20/2017   Procedure: APPLICATION OF A-CELL; Surgeon: Wallace Going, DO; Location: [place]; Service: Plastics; Laterality: Left;  . AV FISTULA PLACEMENT Left 06/30/2017   Procedure: CREATION of LEFT ARM Brachiocephalic Fistula; Surgeon: Conrad Protection, MD; Location: [place]; Service: Vascular; Laterality: Left;  . CARDIAC CATHETERIZATION N/A 12/02/2016   Procedure: Right Heart Cath; Surgeon: Larey Dresser, MD; Location: [place] CV LAB; Service: Cardiovascular; Laterality: N/A;  . COLONOSCOPY N/A 02/14/2017   Procedure: COLONOSCOPY; Surgeon: Milus Banister, MD; Location: [place]; Service: Endoscopy; Laterality: N/A;  . COLONOSCOPY WITH PROPOFOL N/A 02/27/2018   Procedure: COLONOSCOPY WITH PROPOFOL; Surgeon: Jerene Bears, MD; Location: [place]; Service: Gastroenterology; Laterality: N/A;  . CORONARY ANGIOPLASTY WITH STENT PLACEMENT  2013   in [place]  . CORONARY ARTERY BYPASS GRAFT  2010   in [place]  . DIALYSIS/PERMA CATHETER INSERTION  06/30/2017   Procedure: INSERTION Dialysis Catheter; Surgeon: Conrad Titus, MD; Location: [facility] OR; Service: Vascular;;  . ESOPHAGOGASTRODUODENOSCOPY (EGD) WITH PROPOFOL N/A 02/27/2018   Procedure: ESOPHAGOGASTRODUODENOSCOPY (EGD) WITH PROPOFOL; Surgeon: Jerene Bears, MD; Location: [facility] ENDOSCOPY; Service: Gastroenterology; Laterality: N/A;  . GIVENS CAPSULE STUDY N/A 02/27/2018   Procedure: GIVENS  CAPSULE  STUDY; Surgeon: Jerene Bears, MD; Location: [facility] ENDOSCOPY; Service: Gastroenterology; Laterality: N/A;  . GIVENS CAPSULE STUDY N/A 03/25/2018   Procedure: GIVENS CAPSULE STUDY; Surgeon: Jackquline Denmark, MD; Location: [facility] ENDOSCOPY; Service: Endoscopy; Laterality: N/A;  . IABP INSERTION N/A 12/11/2016   Procedure: IABP Insertion; Surgeon: Larey Dresser, MD; Location: [place] CV LAB; Service: Cardiovascular; Laterality: N/A;  . INSERTION OF IMPLANTABLE LEFT VENTRICULAR ASSIST DEVICE N/A 12/13/2016   Procedure: INSERTION OF IMPLANTABLE LEFT VENTRICULAR ASSIST DEVICE; Surgeon: Ivin Poot, MD; Location: [place]; Service: Open Heart Surgery; Laterality: N/A; HEARTMATE II  NITRIC OXIDE  . IR FLUORO GUIDE CV LINE LEFT  06/17/2017  . IR US GUIDE VASC ACCESS LEFT  06/17/2017  . MASS EXCISION Left 11/20/2017   Procedure: EXCISION OF LEFT NECK AND RIGHT LEG SKIN CANCER WITH A CELL PLACEMENT; Surgeon: Wallace Going, DO; Location: [place]; Service: Plastics; Laterality: Left;  Marland Kitchen MASS EXCISION Left 11/26/2017   Procedure: EXPLORATION AND EXCISION OF LEFT NECK; Surgeon: Wallace Going, DO; Location: [place]; Service: Plastics; Laterality: Left;  . RIGHT HEART CATH N/A 12/11/2016   Procedure: Right Heart Cath; Surgeon: Larey Dresser, MD; Location: [place] CV LAB; Service: Cardiovascular; Laterality: N/A;  . RIGHT HEART CATH N/A 03/27/2017   Procedure: Right Heart Cath; Surgeon: Larey Dresser, MD; Location: [place] CV LAB; Service: Cardiovascular; Laterality: N/A;  . RIGHT HEART CATH N/A 06/13/2017   Procedure: RIGHT HEART CATH; Surgeon: Jolaine Artist, MD; Location: [place] CV LAB; Service: Cardiovascular; Laterality: N/A;  . TEE WITHOUT CARDIOVERSION N/A 12/13/2016   Procedure: TRANSESOPHAGEAL ECHOCARDIOGRAM (TEE); Surgeon: Ivin Poot, MD; Location: [place]; Service: Open Heart Surgery; Laterality: N/A;  . TRICUSPID VALVE REPLACEMENT N/A 12/13/2016   Procedure: TRICUSPID VALVE REPAIR  WITH EDWARDS MC 3 TRICUSPID ANNULOPLASTY RING MODEL 4900 SIZE T 28; Surgeon: Ivin Poot, MD; Location: [place]; Service: Open Heart Surgery; Laterality: N/A;        Family History  Problem Relation Age of Onset  . Heart failure Father   . Heart attack Father   . Healthy Mother   . Diabetes Paternal Grandfather    Social History: reports that he has never smoked. He has never used smokeless tobacco. He reports that he does not drink alcohol or use drugs.  Allergies: No Known Allergies        Medications Prior to Admission  Medication Sig Dispense Refill  . acetaminophen (TYLENOL) 325 MG tablet Take 2 tablets (650 mg total) by mouth every 4 (four) hours as needed for headache or mild pain.    Marland Kitchen albuterol (PROVENTIL HFA;VENTOLIN HFA) 108 (90 Base) MCG/ACT inhaler Inhale 2 puffs into the lungs every 4 (four) hours as needed for wheezing or shortness of breath. 3 Inhaler 5  . atorvastatin (LIPITOR) 40 MG tablet TAKE 1 TABLET(40 MG) BY MOUTH DAILY (Patient taking differently: Take 40 mg by mouth daily at 6 PM. ) 30 tablet 11  . busPIRone (BUSPAR) 5 MG tablet Take 1 tablet (5 mg total) by mouth 2 (two) times daily. 180 tablet 3  . calcitRIOL (ROCALTROL) 0.5 MCG capsule Take 1 capsule (0.5 mcg total) by mouth every Monday, Wednesday, and Friday with hemodialysis.    Marland Kitchen danazol (DANOCRINE) 100 MG capsule Take 1 capsule (100 mg total) by mouth 2 (two) times daily. 60 capsule 5  . Darbepoetin Alfa (ARANESP) 200 MCG/0.4ML SOSY injection Inject 0.4 mLs (200 mcg total) into the vein every Monday with hemodialysis. 1.68  mL   . diphenhydrAMINE (BENADRYL) 25 mg capsule Take 1 capsule (25 mg total) by mouth as needed for allergies (30 min prior to blood transfusion). 30 capsule 0  . fluticasone (FLOVENT HFA) 110 MCG/ACT inhaler Inhale 2 puffs into the lungs 2 (two) times daily. 3 Inhaler 5  . gabapentin (NEURONTIN) 600 MG tablet Take 0.5 tablets (300 mg total) by mouth 2 (two) times daily.    Marland Kitchen guaiFENesin  (ROBITUSSIN) 100 MG/5ML SOLN Take 5 mLs (100 mg total) by mouth every 4 (four) hours as needed for cough or to loosen phlegm. 1200 mL 0  . heparin 1000 unit/mL SOLN injection 1 mL (1,000 Units total) by Dialysis route as needed (in dialysis).    . insulin glargine (LANTUS) 100 UNIT/ML injection Inject 0.08 mLs (8 Units total) into the skin at bedtime. 10 mL 11  . levothyroxine (SYNTHROID, LEVOTHROID) 25 MCG tablet Take 1 tablet (25 mcg total) by mouth daily before breakfast. 90 tablet 3  . magnesium oxide (MAG-OX) 400 MG tablet Take 400 mg by mouth daily.    . metoCLOPramide (REGLAN) 10 MG/10ML SOLN Take 5 mLs (5 mg total) by mouth 4 (four) times daily - before meals and at bedtime. 600 mL 0  . mometasone-formoterol (DULERA) 200-5 MCG/ACT AERO Inhale 2 puffs into the lungs 2 (two) times daily.    . multivitamin (RENA-VIT) TABS tablet Take 1 tablet by mouth at bedtime. 30 tablet 6  . octreotide 500 mcg in sodium chloride 0.9 % 250 mL Inject 50 mcg/hr into the vein continuous.    . ondansetron (ZOFRAN) 4 MG/2ML SOLN injection Inject 2 mLs (4 mg total) into the vein every 6 (six) hours as needed for nausea. 2 mL 0  . pantoprazole (PROTONIX) 40 MG tablet Take 1 tablet (40 mg total) by mouth daily. 30 tablet 5  . predniSONE (DELTASONE) 20 MG tablet Take 3 tablets (60 mg total) by mouth daily with breakfast.    . sildenafil (REVATIO) 20 MG tablet Take 2 tablets (40 mg total) 3 (three) times daily by mouth. (Patient taking differently: Take 40 mg by mouth See admin instructions. Take two tablets (40 mg) by mouth three times a day- midday, afternoon, and bedtime) 180 tablet 6  . traMADol (ULTRAM) 50 MG tablet Take 1 tablet (50 mg total) by mouth every 12 (twelve) hours as needed. TAKE 1 TABLET BY MOUTH EVERY 6 HOURS AS NEEDED FOR MODERATE PAIN (Patient taking differently: Take 50 mg by mouth every 6 (six) hours as needed for moderate pain. ) 60 tablet 3  . traZODone (DESYREL) 100 MG tablet TAKE 1 TABLET BY  MOUTH EVERY NIGHT AT BEDTIME AS NEEDED FOR SLEEP (Patient taking differently: TAKE 1 TABLET BY MOUTH EVERY NIGHT AT BEDTIME) 30 tablet 0   Home:  Home Living  Living Arrangements: Other relatives  Functional History:   Functional Status:  Mobility:      ADL:   Cognition:  Cognition  Orientation Level: Oriented X4   Blood pressure 108/82, pulse 88, temperature 97.8 F (36.6 C), temperature source Oral, resp. rate 13, height 5\' 5"  (1.651 m), weight 85.6 kg (188 lb 11.4 oz), SpO2 98 %.  Physical Exam  Vitals reviewed.  Constitutional: No distress.  HENT:  Head: Normocephalic and atraumatic.  Eyes: Pupils are equal, round, and reactive to light. EOM are normal.  Neck: Normal range of motion.  Cardiovascular:  Hum from LVAD  Respiratory: Effort normal.  GI: Soft.  Musculoskeletal: He exhibits edema (1+ bilateral LE).  Left knee remains tender/warm  Neurological:  Patient is alert and follows commands. [place]. UE grossly 3+ to 4/5 prox to distal. LE 3- to 3/5 HF to 4- ADF/PF. Left knee limited by pain  Skin:  Left knee incision intact with sutures, multiple abrasions and ecchymoses on skin  Psychiatric: He has a normal mood and affect. His behavior is normal.   Lab Results Last 24 Hours                                                                                                                                                                                                                                                                                                                                                                                                                        Imaging Results (Last 48 hours)     Assessment/Plan:  Diagnosis: 71 yo male with ICM and subsequent LVAD, prolonged hospital course at [facility], now severely deconditioned  1. Does the need for  close, 24 hr/day medical supervision in concert with the patient's rehab needs make it unreasonable for this patient to be served in a less intensive setting? Yes and Potentially 2. Co-Morbidities requiring supervision/potential complications: COPD, ESRD on HD 3. Due to bladder management, bowel management, safety, skin/wound care, disease management, medication administration, pain management and patient education, does the patient require 24 hr/day rehab nursing? Yes and Potentially 4. Does the patient require coordinated care of a physician, rehab nurse, PT (1-2 hrs/day, 5  days/week) and OT (1-2 hrs/day, 5 days/week) to address physical and functional deficits in the context of the above medical diagnosis(es)? Yes and Potentially Addressing deficits in the following areas: balance, endurance, locomotion, strength, transferring, bowel/bladder control, bathing, dressing, feeding, grooming, toileting and psychosocial support 5. Can the patient actively participate in an intensive therapy program of at least 3 hrs of therapy per day at least 5 days per week? Potentially 6. The potential for patient to make measurable gains while on inpatient rehab is good and fair 7. Anticipated functional outcomes upon discharge from inpatient rehab are modified independent, supervision and min assist with PT, modified independent, supervision and min assist with OT, n/a with SLP. 8. Estimated rehab length of stay to reach the above functional goals is: TBD 9. Anticipated D/C setting: Home 10. Anticipated post D/C treatments: HH therapy and Outpatient therapy 11. Overall Rehab/Functional Prognosis: good RECOMMENDATIONS:  This patient's condition is appropriate for continued rehabilitative care in the following setting: see below  Patient has agreed to participate in recommended program. Yes  Note that insurance prior authorization may be required for reimbursement for recommended care.  Comment: Pt/case familiar to  me from prior [facility] chart review. Lives with sister who probably provide light assist at home. Has no steps in home and only one to enter. Will observe for activity tolerance and medical stability. Consider inpatient rehab stay depending upon progress. Rehab Admissions Coordinator to follow up.  Thanks,  Meredith Staggers, MD, Mellody Drown  I have personally performed a face to face diagnostic evaluation of this patient. Additionally, I have reviewed and concur with the physician assistant's documentation above.  Lavon Paganini Angiulli, PA-C  05/01/2018

## 2018-05-05 NOTE — PMR Pre-admission (Signed)
PMR Admission Coordinator Pre-Admission Assessment  Patient: Trevor [name] is an 72 y.o., male MRN: [id number] DOB: [birth year] Height: 5\' 5"  (165.1 cm) Weight: 74.2 kg (163 lb 9.3 oz)              Insurance Information HMO:     PPO:      PCP:      IPA:      80/20:      OTHER: no HMO PRIMARY: Medicare and b      Policy#: [id number]      Subscriber: pt Benefits:  Phone #: online     Name:  Eff. Date: 09/04/2010     Deduct: $1364      Out of Pocket Max: none      Life Max: none CIR: 100%      SNF: 20 full days Outpatient: 80%     Co-Pay: 20% Home Health: 100%      Co-Pay: none DME: 80%     Co-Pay: 20% Providers: pt choice  SECONDARY: A[employer]      Policy#: [id number]      Subscriber: pt  Medicaid Application Date:       Case Manager:  Disability Application Date:       Case Worker:   Emergency Contact Information Contact Information    Name Relation Home Work Mobile   Bearden Sister [phone]       Current Medical History  Patient Admitting Diagnosis: debility; LVAD History of Present Illness:  HPI: HELIO LACK is a 72 y.o. right-handed male with complex medical history of CAD with CABG in 2010, OSA, aortic stenosis with TAVR 2015, end-stage renal disease with hemodialysis, diabetes mellitus, chronic atrial fibrillation on Coumadin in the past and recently resumed .Admitted to [facility] 03/09/2018 with recurrent GI Bleed. history with Medtronic ICD.S/PHMII LVAD for Destination therapy on 12/14/2015.  [facility] with recurrent GI bleed. Coumadin was stopped. He received 3UPRBCs. Transferred to [facility] for deep entersospcopy on 03/10/2018.   Hospital course at [facility] double retro balloon scope and he was sent for IR embolization x 3but he had no evidence of bleeding. Received12 UPRBCs. He remained off anticoagulants.   Transferred back to [facility]5/17. IR consulted[facility] 03/23/18 with no active bleeding, however repeat capsule study 03/25/18 showed active bleeding in  mid ileum. General surgery team consultedand recommendedintra-operative endoscopy and surgical resection once source is found at [facility].Received additional15 uPRBCs. Course complicated by left knee pain requiring aspiration and steroid injection. He also had abdominal bloating and distension requiring NGT for decompression. Resolved after BM.  Transferred back to [facility] 04/01/18. He received 13 uPRBCs at [facility]. Underwent laparoscopic surgical enteroscopy on 6/7 and received 4 clips. He had no further bleeding post-op. Last octreotide injection 6/13. Danazol was DC 'd. ASA and coumadin were not resumed. VAD parameters were stable.   Course complicated by AMS and concern for ischemic bowel. Improved with NGT decompression and bowel rest. Head CT negative. Blood cultures and UA were negative. Tolerated clears and thought stable for diet advancement.   Additionally, required multiple left knee aspirations and OR washouts. Aspirate cultures positive for staph epi and clostridium perfringens. Started on Vanc and narrowed to Ancef. Sutures to be reassessed by ortho to determine removal date. Blood cultures remained negative.  Returned to [facility] 04/30/18 for further management and for CIR consideration. He remained on IV ABX with septic knee as above. Renal followed along for his HD.  Pt had no further bleeding, and Ortho followed along for his septic knee.  Past Medical History  Past Medical History:  Diagnosis Date  . AICD (automatic cardioverter/defibrillator) present   . Asthma   . AVM (arteriovenous malformation) of colon 07/07/2017  . CHF (congestive heart failure) ([place])   . Diabetes ([place])   . ESRF (end stage renal failure) ([place]) 07/07/2017   pt receiving hemodialysis Monday- Wednesday-friday, sometimes Saturday  . Kidney disease   . LVAD (left ventricular assist device) present ([place]) 12/2016  . Permanent atrial fibrillation ([place]) 07/07/2017  . Presence of permanent cardiac pacemaker    AICD  .  Sleep apnea     Family History  family history includes Diabetes in his paternal grandfather; Healthy in his mother; Heart attack in his father; Heart failure in his father.  Prior Rehab/Hospitalizations:  Has the patient had major surgery during 100 days prior to admission? Yes  Current Medications   Current Facility-Administered Medications:  .  0.9 %  sodium chloride infusion, 100 mL, Intravenous, PRN, Loren Racer, PA-C .  0.9 %  sodium chloride infusion, 100 mL, Intravenous, PRN, Stovall, Woodfin Ganja, PA-C .  acetaminophen (TYLENOL) tablet 650 mg, 650 mg, Oral, Q4H PRN, Georgiana Shore, NP, 650 mg at 05/02/18 1600 .  albuterol (PROVENTIL) (2.5 MG/3ML) 0.083% nebulizer solution 2.5 mg, 2.5 mg, Nebulization, Q4H PRN, Georgiana Shore, NP .  atorvastatin (LIPITOR) tablet 40 mg, 40 mg, Oral, q1800, Georgiana Shore, NP, 40 mg at 05/04/18 1803 .  benzonatate (TESSALON) capsule 100 mg, 100 mg, Oral, TID PRN, Georgiana Shore, NP, 100 mg at 05/03/18 1401 .  budesonide (PULMICORT) nebulizer solution 0.25 mg, 0.25 mg, Nebulization, BID, Georgiana Shore, NP, 0.25 mg at 05/05/18 0755 .  busPIRone (BUSPAR) tablet 5 mg, 5 mg, Oral, BID, Georgiana Shore, NP, 5 mg at 05/04/18 2106 .  calcitRIOL (ROCALTROL) capsule 0.5 mcg, 0.5 mcg, Oral, Q M,W,F, Georgiana Shore, NP, 0.5 mcg at 05/04/18 6948 .  Chlorhexidine Gluconate Cloth 2 % PADS 6 each, 6 each, Topical, Q0600, Loren Racer, PA-C, 6 each at 05/01/18 [phone] .  Darbepoetin Alfa (ARANESP) injection 100 mcg, 100 mcg, Intravenous, Q Mon-HD, Ernest Haber, PA-C, 100 mcg at 05/04/18 1724 .  insulin aspart (novoLOG) injection 0-15 Units, 0-15 Units, Subcutaneous, TID WC, Georgiana Shore, NP, 2 Units at 05/05/18 0847 .  insulin aspart (novoLOG) injection 0-5 Units, 0-5 Units, Subcutaneous, QHS, Georgiana Shore, NP .  insulin aspart (novoLOG) injection 3 Units, 3 Units, Subcutaneous, TID WC, Georgiana Shore, NP, 3 Units at 05/05/18 0847 .   levothyroxine (SYNTHROID, LEVOTHROID) tablet 25 mcg, 25 mcg, Oral, QAC breakfast, Georgiana Shore, NP, 25 mcg at 05/05/18 [phone] .  lidocaine (PF) (XYLOCAINE) 1 % injection 5 mL, 5 mL, Intradermal, PRN, Stovall, Woodfin Ganja, PA-C .  lidocaine-prilocaine (EMLA) cream 1 application, 1 application, Topical, PRN, Loren Racer, PA-C .  ondansetron [facility]) injection 4 mg, 4 mg, Intravenous, Q6H PRN, Georgiana Shore, NP .  oxyCODONE (Oxy IR/ROXICODONE) immediate release tablet 5 mg, 5 mg, Oral, Q4H PRN, Georgiana Shore, NP, 5 mg at 05/05/18 0743 .  pantoprazole (PROTONIX) injection 40 mg, 40 mg, Intravenous, Q12H, Georgiana Shore, NP, 40 mg at 05/04/18 2105 .  pentafluoroprop-tetrafluoroeth (GEBAUERS) aerosol 1 application, 1 application, Topical, PRN, Loren Racer, PA-C, 2 application at 00/93/81 0841 .  senna-docusate (Senokot-S) tablet 1 tablet, 1 tablet, Oral, BID, Georgiana Shore, NP, 1 tablet at 05/05/18 [phone] .  sildenafil (REVATIO) tablet 20 mg, 20 mg, Oral, TID,  Bensimhon, Shaune Pascal, MD, 20 mg at 05/05/18 6294 .  traMADol (ULTRAM) tablet 100 mg, 100 mg, Oral, TID PRN, Bensimhon, Shaune Pascal, MD, 100 mg at 05/04/18 1442 .  traZODone (DESYREL) tablet 50 mg, 50 mg, Oral, QHS PRN, Georgiana Shore, NP .  warfarin (COUMADIN) tablet 3 mg, 3 mg, Oral, ONCE-1800, Bensimhon, Shaune Pascal, MD .  Warfarin - Pharmacist Dosing Inpatient, , Does not apply, q1800, Bensimhon, Shaune Pascal, MD  Patients Current Diet:  Diet Order           Diet - low sodium heart healthy        Diet heart healthy/carb modified Room service appropriate? Yes; Fluid consistency: Thin; Fluid restriction: 2000 mL Fluid  Diet effective now          Precautions / Restrictions Precautions Precautions: Other (comment), Fall Precaution Comments: LVAD Other Brace/Splint: hinged knee brace per pt comfort Restrictions Weight Bearing Restrictions: No   Has the patient had 2 or more falls or a fall with injury in the past  year?No  Prior Activity Level Community (5-7x/wk): independent and driving himself to dialysis 4 times per week  [place] Devices/Equipment: Eyeglasses(LVAD eequipment) Home Equipment: Shower seat  Prior Device Use: Indicate devices/aids used by the patient prior to current illness, exacerbation or injury? None of the above  Prior Functional Level Prior Function Level of Independence: Independent Comments: pt indep with driving, ADLs, and mobility. no AD needed  Self Care: Did the patient need help bathing, dressing, using the toilet or eating?  Independent  Indoor Mobility: Did the patient need assistance with walking from room to room (with or without device)? Independent  Stairs: Did the patient need assistance with internal or external stairs (with or without device)? Independent  Functional Cognition: Did the patient need help planning regular tasks such as shopping or remembering to take medications? Independent  Current Functional Level Cognition  Overall Cognitive Status: Within Functional Limits for tasks assessed Orientation Level: Oriented X4 General Comments: pt expresses his frustration but reports he's continuing to try to stay posisitve    Extremity Assessment (includes Sensation/Coordination)  Upper Extremity Assessment: Overall WFL for tasks assessed  Lower Extremity Assessment: Defer to PT evaluation LLE Deficits / Details: limited ROM dur to increased LE edema LLE: Unable to fully assess due to pain LLE Coordination: decreased fine motor, decreased gross motor    ADLs  Overall ADL's : Needs assistance/impaired Eating/Feeding: Independent Grooming: Wash/dry hands, Wash/dry face, Oral care, Brushing hair, Min guard, Standing Upper Body Bathing: Set up, Supervision/ safety, Sitting Lower Body Bathing: Moderate assistance, Sit to/from stand Lower Body Bathing Details (indicate cue type and reason): unable to access  Lt foot  Upper Body Dressing : Set up, Sitting Lower Body Dressing: Moderate assistance, Sit to/from stand Lower Body Dressing Details (indicate cue type and reason): unable to access Lt foot  Toilet Transfer: Min guard, Ambulation, Comfort height toilet, Grab bars, RW Toileting- Clothing Manipulation and Hygiene: Minimal assistance, Sit to/from stand Functional mobility during ADLs: Min guard, Rolling walker    Mobility  Overal bed mobility: Needs Assistance Bed Mobility: Supine to Sit Supine to sit: Supervision, HOB elevated General bed mobility comments: pt up in chair upon PT arrival    Transfers  Overall transfer level: Needs assistance Equipment used: Rolling walker (2 wheeled), 4-wheeled walker Transfers: Sit to/from Stand Sit to Stand: Min guard, Min assist Stand pivot transfers: Min guard, Min assist General transfer comment: minguard from chair  with armrests, min from chair without armrests    Ambulation / Gait / Stairs / Wheelchair Mobility  Ambulation/Gait Ambulation/Gait assistance: Counsellor (Feet): 24 Feet Assistive device: Rolling walker (2 wheeled) Gait Pattern/deviations: Step-to pattern, Decreased stance time - left General Gait Details: pt able to perform sequence without cues, guarding for safety, decreased speed Gait velocity: decreased Gait velocity interpretation: <1.31 ft/sec, indicative of household ambulator    Posture / Balance Balance Overall balance assessment: Needs assistance Sitting-balance support: Feet supported Sitting balance-[name]: Good Standing balance support: Single extremity supported, During functional activity Standing balance-[name]: Poor Standing balance comment: reliant on UE support.  When pt attempted to lift bil. UEs off RW to adjust controller, he had significant LOB to the Rt requiring mod A to recover     Special needs/care consideration BiPAP/CPAP has not uses CPAP since LVAD 12/2016 CPM  n/a Continuous Drip IV n/a Dialysis ESRD on hemodialysis Monday, Wednesday, Friday and Saturday; drives self Life Vest  N/a Oxygen  Room air Special Bed n/a Trach Size n/a Wound Vac n/a Skin left knee incision with sutures; drive line with dressing; fistula left upper arm with dressing; ecchymosis abdomen, bilateral arms and legs; eczema to abdomen , back and buttocks Bowel mgmt: continent LBM 7/1 Bladder mgmt: oliguric Diabetic mgmt yes pta LVAD Medtronic ICD Palliative consult 03/2018 during his long hospitalization for goals of care clarification   Previous Home Environment Living Arrangements: (lives with his sister)  Lives With: (sister) Available Help at Discharge: Family, Available 24 hours/day Type of Home: House Home Layout: One level Home Access: Level entry Bathroom Shower/Tub: Tub/shower unit, Architectural technologist: Standard Bathroom Accessibility: Yes How Accessible: Accessible via walker Home Care Services: No Additional Comments: goes to heart clinic weekly for labs and driveline dressing change  Discharge Living Setting Plans for Discharge Living Setting: Patient's home, Lives with (comment)(sister) Type of Home at Discharge: House Discharge Home Layout: One level Discharge Home Access: Level entry(6 inch step into) Discharge Bathroom Shower/Tub: Tub/shower unit, Curtain Discharge Bathroom Toilet: Standard Discharge Bathroom Accessibility: Yes How Accessible: Accessible via walker Does the patient have any problems obtaining your medications?: No  Social/Family/Support Systems Contact Information: sister Anticipated Caregiver: sister Anticipated Caregiver's Contact Information: see above Ability/Limitations of Caregiver: sister can provde supervision Caregiver Availability: 24/7 Discharge Plan Discussed with Primary Caregiver: Yes Is Caregiver In Agreement with Plan?: Yes Does Caregiver/Family have Issues with Lodging/Transportation while Pt is in  Rehab?: Yes  Goals/Additional Needs Patient/Family Goal for Rehab: Mod I to supervsiion PT and OT Expected length of stay: ELOS 7 to 10 days Equipment Needs: LVAD Special Service Needs: ESRD on hemodialysis M, W, Fri and SAT; must be dialyzed in patient room due to LVAD Pt/Family Agrees to Admission and willing to participate: Yes Program Orientation Provided & Reviewed with Pt/Caregiver Including Roles  & Responsibilities: Yes  Decrease burden of Care through IP rehab admission: n/a   Possible need for SNF placement upon discharge: not anticiapted; [facility] only local SNF that will take LVADs  Patient Condition: This patient's medical and functional status has changed since the consult dated 05/01/2018 in which the Rehabilitation Physician determined and documented that the patient was potentially appropriate for intensive rehabilitative care in an inpatient rehabilitation facility. Issues have been addressed and update has been discussed with Dr. Posey Pronto and patient now appropriate for inpatient rehabilitation. Will admit to inpatient rehab today.   Preadmission Screen Completed By:  Cleatrice Burke, 05/05/2018 11:36 AM ______________________________________________________________________  Discussed status with Dr. Posey Pronto on 05/05/2018 at  1144 and received telephone approval for admission today.  Admission Coordinator:  Cleatrice Burke, time 2863 Date 05/05/2018

## 2018-05-05 NOTE — IPOC Note (Signed)
Overall Plan of Care [facility]) Patient Details Name: Trevor [name] MRN: [id number] DOB: 06-17-1946  Admitting Diagnosis: Debility  Hospital Problems: Active Problems:   Physical debility   Leukocytosis   Acute blood loss anemia   History of GI bleed     Functional Problem List: Nursing Pain, Safety, Skin Integrity, Endurance, Motor  PT Balance, Edema, Endurance, Pain, Safety  OT Balance, Endurance, Motor, Pain, Safety  SLP    TR         Basic ADL's: OT Grooming, Toileting, Bathing, Dressing     Advanced  ADL's: OT Simple Meal Preparation     Transfers: PT Bed Mobility, Bed to Chair, Car, Furniture, Floor  OT Toilet     Locomotion: PT Stairs, Emergency planning/management officer, Ambulation     Additional Impairments: OT    SLP        TR      Anticipated Outcomes Item Anticipated Outcome  Self Feeding    Swallowing      Basic self-care  Mod I  Toileting  Mod I   Bathroom Transfers Mod I  Bowel/Bladder  Patient will continue to be continent of bowel and bladder during admission  Transfers  Mod I   Locomotion  Supervision household gait  Communication     Cognition     Pain  Patient will be pain free or pain less than4  Safety/Judgment  Patient will be free from falls and adhere to safety plan   Therapy Plan: PT Intensity: Minimum of 1-2 x/day ,45 to 90 minutes PT Frequency: 5 out of 7 days PT Duration Estimated Length of Stay: 7-10 days OT Intensity: Minimum of 1-2 x/day, 45 to 90 minutes OT Frequency: 5 out of 7 days OT Duration/Estimated Length of Stay: 7-10 days      Team Interventions: Nursing Interventions Patient/Family Education, Pain Management, Skin Care/Wound Management  PT interventions Disease management/prevention, Ambulation/gait training, Pain management, Stair training, Wheelchair propulsion/positioning, Therapeutic Activities, Visual/perceptual remediation/compensation, Patient/family education, DME/adaptive equipment instruction,  Training and development officer, Cognitive remediation/compensation, Psychosocial support, UE/LE Strength taining/ROM, Skin care/wound management, Therapeutic Exercise, Functional mobility training, Community reintegration, Discharge planning, Neuromuscular re-education, Splinting/orthotics, UE/LE Coordination activities  OT Interventions Training and development officer, Discharge planning, Disease mangement/prevention, DME/adaptive equipment instruction, Functional mobility training, Pain management, Patient/family education, Psychosocial support, Self Care/advanced ADL retraining, Skin care/wound managment, Therapeutic Activities, Therapeutic Exercise, UE/LE Strength taining/ROM, UE/LE Coordination activities  SLP Interventions    TR Interventions    SW/CM Interventions Discharge Planning, Psychosocial Support, Patient/Family Education   Barriers to Discharge MD  Medical stability, Wound care and Hemodialysis  Nursing      PT Hemodialysis, Medical stability Needs to go to dialysis multiple days per week  OT      SLP      SW       Team Discharge Planning: Destination: PT-Home ,OT- Home , SLP-  Projected Follow-up: PT-Home health PT, OT-  None, SLP-  Projected Equipment Needs: PT-To be determined, OT- None recommended by OT, SLP-  Equipment Details: PT- , OT-  Patient/family involved in discharge planning: PT- Patient,  OT-Patient, SLP-   MD ELOS: 6-9 days. Medical Rehab Prognosis:  Fair and good. Assessment: 72 year old male with history of CAD s/p CABG, OSA, AS with TVAR '15, T2DM, CAF, ESRD- HD MWFSa, ICM s/p HMII LVAD destination therapy 12/2015, gastric AVMs with multiple episodes of GIB treated with IR embolization and most recent admission to [facility] 01/02/53 and complicated with need to return to [facility] X 2 --last on 04/01/18  for recurrent bleed. Bleeding treated with clipping, 13 additional units PRBCs  and octreotide injection with hospitalization complicated by ischemic bowel, encephalopathy,  septic need s/p multiple washouts --placed on ancef for staph epi and clostridium perfingen and transferred back to [facility] for management on 6/27.  Heparin/coumadin were resumed and H/H being monitored without signs of bleeding.  Hemodialysis ongoing with aggressive attempts at fluid management. Patient with resulting functional deficits with mobility, transfers, endurance, self-care.  Will set goals for Mod I with PT/OT.  See Team Conference Notes for weekly updates to the plan of care

## 2018-05-05 NOTE — Progress Notes (Signed)
Patient ID: Trevor [name], male   DOB: 16-Jun-1946, 72 y.o.   MRN: [id number] [facility] Progress Note   Assessment/ Plan:   1.  Lower GI bleeding: Recurrent (AVM/ischemic bowel) with no overt loss at this time after surgical enteroscopy with 4 clips at [facility].  Hemoglobin/hematocrit as well as hemodynamic status appears stable. 2. Left knee septic arthritis status post surgical washout: Surgical scar appearing to heal well and currently on intravenous cefazolin with ongoing physical therapy/Occupational Therapy. 3. End-stage renal disease: Continue current hemodialysis schedule (MWFSa) with efforts to try and provide clearance/control volume status-we will order for hemodialysis again tomorrow 4. Hypertension/volume: Hypervolemic on physical exam but hypotension limiting of aggressive ultrafiltration-monitor with hemodialysis. 5. Anemia of ESRD: With history of recurrent GI bleed in the setting of end-stage renal disease on hemodialysis-status post PRBC transfusions and will continue ESA/IV iron 6. Metabolic bone disease: Phosphorus currently controlled off of binders, continue VDRA for PTH control  7. 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 8. T2DM 9. OSA  Subjective:   Tolerated hemodialysis without problems yesterday-reports to be feeling somewhat better and anticipated discharge to CIR in the near future.   Objective:   BP (!) 80/70   Pulse 69   Temp 97.8 F (36.6 C) (Oral)   Resp (!) 8   Ht 5\' 5"  (1.651 m)   Wt 74.2 kg (163 lb 9.3 oz)   SpO2 97%   BMI 27.22 kg/m   Physical Exam: Gen: Comfortably resting in bed CVS: Pulse regular rhythm, normal rate, LVAD hum audible over precordium Resp: Clear to auscultation bilaterally, no rales/rhonchi Abd: Soft, obese, nontender Ext: 2+ pitting edema.  Left knee surgical scar healing well, left upper arm aVF with audible bruit  Labs: BMET Recent Labs  Lab 05/01/18 0425 05/02/18 1346  05/03/18 0804 05/04/18 0624 05/05/18 0644  NA 136 135 136 131* 135  K 3.7 3.2* 4.0 4.3 4.2  CL 100 98 96* 93* 96*  CO2 26 27 30 29 31   GLUCOSE 144* 104* 169* 169* 150*  BUN 19 12 7* 15 7*  CREATININE 6.09* 4.61* 3.33* 4.57* 3.29*  CALCIUM 7.7* 7.3* 7.5* 7.4* 7.5*   CBC Recent Labs  Lab 05/02/18 1346 05/03/18 0804 05/04/18 0624 05/05/18 0644  WBC 14.0* 13.9* 15.1* 12.0*  HGB 9.1* 9.7* 9.3* 9.7*  HCT 30.1* 32.5* 31.1* 31.8*  MCV 95.9 96.2 97.2 96.7  PLT 154 175 149* 153   Medications:    . atorvastatin  40 mg Oral q1800  . budesonide  0.25 mg Nebulization BID  . busPIRone  5 mg Oral BID  . calcitRIOL  0.5 mcg Oral Q M,W,F  . Chlorhexidine Gluconate Cloth  6 each Topical Q0600  . darbepoetin (ARANESP) injection - DIALYSIS  100 mcg Intravenous Q Mon-HD  . insulin aspart  0-15 Units Subcutaneous TID WC  . insulin aspart  0-5 Units Subcutaneous QHS  . insulin aspart  3 Units Subcutaneous TID WC  . levothyroxine  25 mcg Oral QAC breakfast  . pantoprazole (PROTONIX) IV  40 mg Intravenous Q12H  . senna-docusate  1 tablet Oral BID  . sildenafil  20 mg Oral TID  . Warfarin - Pharmacist Dosing Inpatient   Does not apply [id number]   Elmarie Shiley, MD 05/05/2018, 7:55 AM

## 2018-05-05 NOTE — Progress Notes (Signed)
CRITICAL VALUE ALERT  Critical Value:  BG 58  Date & Time Notied:  1703  Provider Notified: No  Orders Received/Actions taken: Given OJ with sugar packets; will check again in 34mins

## 2018-05-05 NOTE — Progress Notes (Signed)
Cristina Gong, RN  Rehab Admission Coordinator  Physical Medicine and Rehabilitation  PMR Pre-admission  Signed  Date of Service:  05/05/2018 11:36 AM       Related encounter: Admission (Current) from 04/30/2018 in [place]           Show:Clear all [x] Manual[x] Template[x] Copied  Added by: [x] Cristina Gong, RN   [] Hover for details   PMR Admission Coordinator Pre-Admission Assessment  Patient: Trevor [name] is an 73 y.o., male MRN: [id number] DOB: 1946-02-07 Height: 5\' 5"  (165.1 cm) Weight: 74.2 kg (163 lb 9.3 oz)                                                                                                                                                  Insurance Information HMO:     PPO:      PCP:      IPA:      80/20:      OTHER: no HMO PRIMARY: Medicare and b      Policy#: [id number]      Subscriber: pt Benefits:  Phone #: online     Name:  Eff. Date: 09/04/2010     Deduct: $1364      Out of Pocket Max: none      Life Max: none CIR: 100%      SNF: 20 full days Outpatient: 80%     Co-Pay: 20% Home Health: 100%      Co-Pay: none DME: 80%     Co-Pay: 20% Providers: pt choice  SECONDARY: [employer]      Policy#: [id number]      Subscriber: pt  Medicaid Application Date:       Case Manager:  Disability Application Date:       Case Worker:   Emergency Contact Information         Contact Information    Name Relation Home Work Mobile   [place] Sister [phone]       Current Medical History  Patient Admitting Diagnosis: debility; LVAD History of Present Illness:  QMG:QQPYPP C Murphyis a 72 y.o.right-handed malewith complex medical history of CAD with CABG in 2010, OSA, aortic stenosis with TAVR 2015, end-stage renal disease with hemodialysis, diabetes mellitus, chronic atrial fibrillation on Coumadin in the past and recently resumed .Ad[facility] to [facility]/09/2018 with recurrent GI Bleed. history with  Medtronic ICD.S/PHMII LVAD for Destination therapy on 12/14/2015.  [facility] with recurrent GI bleed. Coumadin was stopped. He received 3UPRBCs. Transferred to [facility] for deep entersospcopy on 03/10/2018.   Hospital course at [facility] double retro balloon scope and he was sent for IR embolization x 3but he had no evidence of bleeding. Received12 UPRBCs. He remained off anticoagulants.   Transferred back to [facility]5/17. IR consultedCTA 03/23/18 with no active bleeding, however repeat capsule  study 03/25/18 showed active bleeding in mid ileum. General surgery team consultedand recommendedintra-operative endoscopy and surgical resection once source is found at [facility].Received additional15 uPRBCs. Course complicated by left knee pain requiring aspiration and steroid injection. He also had abdominal bloating and distension requiring NGT for decompression. Resolved after BM.  Transferred back to [facility] 04/01/18. He received 13 uPRBCs at [facility]. Underwent laparoscopic surgical enteroscopy on 6/7 and received 4 clips. He had no further bleeding post-op. Last octreotide injection 6/13. Danazol was DC 'd. ASA and coumadin were not resumed. VAD parameters were stable.   Course complicated by AMS and concern for ischemic bowel. Improved with NGT decompression and bowel rest. Head CT negative. Blood cultures and UA were negative. Tolerated clears and thought stable for diet advancement.   Additionally, required multiple left knee aspirations and OR washouts. Aspirate cultures positive for staph epi and clostridium perfringens. Started on Vanc and narrowed to Ancef. Sutures to be reassessed by ortho to determine removal date. Blood cultures remained negative.  [facility] to [facility]for further managementand for CIR consideration. He remained on IV ABX with septic knee as above. Renal followed along for his HD. Pt had no further bleeding, and Ortho followed along for his septic knee.   Past  Medical History      Past Medical History:  Diagnosis Date  . AICD (automatic cardioverter/defibrillator) present   . Asthma   . AVM (arteriovenous malformation) of colon 07/07/2017  . CHF (congestive heart failure) ([place])   . Diabetes ([place])   . ESRF (end stage renal failure) ([place]) 07/07/2017   pt receiving hemodialysis Monday- Wednesday-friday, sometimes Saturday  . Kidney disease   . LVAD (left ventricular assist device) present ([place]) 12/2016  . Permanent atrial fibrillation ([place]) 07/07/2017  . Presence of permanent cardiac pacemaker    AICD  . Sleep apnea     Family History  family history includes Diabetes in his paternal grandfather; Healthy in his mother; Heart attack in his father; Heart failure in his father.  Prior Rehab/Hospitalizations:  Has the patient had major surgery during 100 days prior to admission? Yes  Current Medications   Current Facility-Administered Medications:  .  0.9 %  sodium chloride infusion, 100 mL, Intravenous, PRN, Loren Racer, PA-C .  0.9 %  sodium chloride infusion, 100 mL, Intravenous, PRN, Stovall, Woodfin Ganja, PA-C .  acetaminophen (TYLENOL) tablet 650 mg, 650 mg, Oral, Q4H PRN, Georgiana Shore, NP, 650 mg at 05/02/18 1600 .  albuterol (PROVENTIL) (2.5 MG/3ML) 0.083% nebulizer solution 2.5 mg, 2.5 mg, Nebulization, Q4H PRN, Georgiana Shore, NP .  atorvastatin (LIPITOR) tablet 40 mg, 40 mg, Oral, q1800, Georgiana Shore, NP, 40 mg at 05/04/18 1803 .  benzonatate (TESSALON) capsule 100 mg, 100 mg, Oral, TID PRN, Georgiana Shore, NP, 100 mg at 05/03/18 1401 .  budesonide (PULMICORT) nebulizer solution 0.25 mg, 0.25 mg, Nebulization, BID, Georgiana Shore, NP, 0.25 mg at 05/05/18 0755 .  busPIRone (BUSPAR) tablet 5 mg, 5 mg, Oral, BID, Georgiana Shore, NP, 5 mg at 05/04/18 2106 .  calcitRIOL (ROCALTROL) capsule 0.5 mcg, 0.5 mcg, Oral, Q M,W,F, Georgiana Shore, NP, 0.5 mcg at 05/04/18 2595 .  Chlorhexidine Gluconate Cloth 2 % PADS 6  each, 6 each, Topical, Q0600, Loren Racer, PA-C, 6 each at 05/01/18 [phone] .  Darbepoetin Alfa (ARANESP) injection 100 mcg, 100 mcg, Intravenous, Q Mon-HD, Ernest Haber, PA-C, 100 mcg at 05/04/18 1724 .  insulin aspart (novoLOG) injection 0-15 Units, 0-15 Units,  Subcutaneous, TID WC, Georgiana Shore, NP, 2 Units at 05/05/18 [phone] .  insulin aspart (novoLOG) injection 0-5 Units, 0-5 Units, Subcutaneous, QHS, Georgiana Shore, NP .  insulin aspart (novoLOG) injection 3 Units, 3 Units, Subcutaneous, TID WC, Georgiana Shore, NP, 3 Units at 05/05/18 0847 .  levothyroxine (SYNTHROID, LEVOTHROID) tablet 25 mcg, 25 mcg, Oral, QAC breakfast, Georgiana Shore, NP, 25 mcg at 05/05/18 [phone] .  lidocaine (PF) (XYLOCAINE) 1 % injection 5 mL, 5 mL, Intradermal, PRN, Stovall, Woodfin Ganja, PA-C .  lidocaine-prilocaine (EMLA) cream 1 application, 1 application, Topical, PRN, Loren Racer, PA-C .  ondansetron [facility]) injection 4 mg, 4 mg, Intravenous, Q6H PRN, Georgiana Shore, NP .  oxyCODONE (Oxy IR/ROXICODONE) immediate release tablet 5 mg, 5 mg, Oral, Q4H PRN, Georgiana Shore, NP, 5 mg at 05/05/18 0743 .  pantoprazole (PROTONIX) injection 40 mg, 40 mg, Intravenous, Q12H, Georgiana Shore, NP, 40 mg at 05/04/18 2105 .  pentafluoroprop-tetrafluoroeth (GEBAUERS) aerosol 1 application, 1 application, Topical, PRN, Loren Racer, PA-C, 2 application at 84/13/24 0841 .  senna-docusate (Senokot-S) tablet 1 tablet, 1 tablet, Oral, BID, Georgiana Shore, NP, 1 tablet at 05/05/18 [phone] .  sildenafil (REVATIO) tablet 20 mg, 20 mg, Oral, TID, Bensimhon, Shaune Pascal, MD, 20 mg at 05/05/18 0952 .  traMADol (ULTRAM) tablet 100 mg, 100 mg, Oral, TID PRN, Bensimhon, Shaune Pascal, MD, 100 mg at 05/04/18 1442 .  traZODone (DESYREL) tablet 50 mg, 50 mg, Oral, QHS PRN, Georgiana Shore, NP .  warfarin (COUMADIN) tablet 3 mg, 3 mg, Oral, ONCE-1800, Bensimhon, Shaune Pascal, MD .  Warfarin - Pharmacist Dosing Inpatient, , Does not apply,  q1800, Bensimhon, Shaune Pascal, MD  Patients Current Diet:       Diet Order           Diet - low sodium heart healthy        Diet heart healthy/carb modified Room service appropriate? Yes; Fluid consistency: Thin; Fluid restriction: 2000 mL Fluid  Diet effective now          Precautions / Restrictions Precautions Precautions: Other (comment), Fall Precaution Comments: LVAD Other Brace/Splint: hinged knee brace per pt comfort Restrictions Weight Bearing Restrictions: No   Has the patient had 2 or more falls or a fall with injury in the past year?No  Prior Activity Level Community (5-7x/wk): independent and driving himself to dialysis 4 times per week  [place] / [place] Devices/Equipment: Eyeglasses(LVAD eequipment) Home Equipment: Shower seat  Prior Device Use: Indicate devices/aids used by the patient prior to current illness, exacerbation or injury? None of the above  Prior Functional Level Prior Function Level of Independence: Independent Comments: pt indep with driving, ADLs, and mobility. no AD needed  Self Care: Did the patient need help bathing, dressing, using the toilet or eating?  Independent  Indoor Mobility: Did the patient need assistance with walking from room to room (with or without device)? Independent  Stairs: Did the patient need assistance with internal or external stairs (with or without device)? Independent  Functional Cognition: Did the patient need help planning regular tasks such as shopping or remembering to take medications? Independent  Current Functional Level Cognition  Overall Cognitive Status: Within Functional Limits for tasks assessed Orientation Level: Oriented X4 General Comments: pt expresses his frustration but reports he's continuing to try to stay posisitve    Extremity Assessment (includes Sensation/Coordination)  Upper Extremity Assessment: Overall WFL for tasks assessed  Lower Extremity Assessment: Defer to PT evaluation LLE Deficits / Details: limited ROM dur to increased LE edema LLE: Unable to fully assess due to pain LLE Coordination: decreased fine motor, decreased gross motor    ADLs  Overall ADL's : Needs assistance/impaired Eating/Feeding: Independent Grooming: Wash/dry hands, Wash/dry face, Oral care, Brushing hair, Min guard, Standing Upper Body Bathing: Set up, Supervision/ safety, Sitting Lower Body Bathing: Moderate assistance, Sit to/from stand Lower Body Bathing Details (indicate cue type and reason): unable to access Lt foot  Upper Body Dressing : Set up, Sitting Lower Body Dressing: Moderate assistance, Sit to/from stand Lower Body Dressing Details (indicate cue type and reason): unable to access Lt foot  Toilet Transfer: Min guard, Ambulation, Comfort height toilet, Grab bars, RW Toileting- Clothing Manipulation and Hygiene: Minimal assistance, Sit to/from stand Functional mobility during ADLs: Min guard, Rolling walker    Mobility  Overal bed mobility: Needs Assistance Bed Mobility: Supine to Sit Supine to sit: Supervision, HOB elevated General bed mobility comments: pt up in chair upon PT arrival    Transfers  Overall transfer level: Needs assistance Equipment used: Rolling walker (2 wheeled), 4-wheeled walker Transfers: Sit to/from Stand Sit to Stand: Min guard, Min assist Stand pivot transfers: Min guard, Min assist General transfer comment: minguard from chair with armrests, min from chair without armrests    Ambulation / Gait / Stairs / Wheelchair Mobility  Ambulation/Gait Ambulation/Gait assistance: Counsellor (Feet): 24 Feet Assistive device: Rolling walker (2 wheeled) Gait Pattern/deviations: Step-to pattern, Decreased stance time - left General Gait Details: pt able to perform sequence without cues, guarding for safety, decreased speed Gait velocity: decreased Gait velocity interpretation:  <1.31 ft/sec, indicative of household ambulator    Posture / Balance Balance Overall balance assessment: Needs assistance Sitting-balance support: Feet supported Sitting balance-[name]: Good Standing balance support: Single extremity supported, During functional activity Standing balance-[name]: Poor Standing balance comment: reliant on UE support.  When pt attempted to lift bil. UEs off RW to adjust controller, he had significant LOB to the Rt requiring mod A to recover     Special needs/care consideration BiPAP/CPAP has not uses CPAP since LVAD 12/2016 CPM n/a Continuous Drip IV n/a Dialysis ESRD on hemodialysis Monday, Wednesday, Friday and Saturday; drives self Life Vest  N/a Oxygen  Room air Special Bed n/a Trach Size n/a Wound Vac n/a Skin left knee incision with sutures; drive line with dressing; fistula left upper arm with dressing; ecchymosis abdomen, bilateral arms and legs; eczema to abdomen , back and buttocks Bowel mgmt: continent LBM 7/1 Bladder mgmt: oliguric Diabetic mgmt yes pta LVAD Medtronic ICD Palliative consult 03/2018 during his long hospitalization for goals of care clarification   Previous Home Environment Living Arrangements: (lives with his sister)  Lives With: (sister) Available Help at Discharge: Family, Available 24 hours/day Type of Home: House Home Layout: One level Home Access: Level entry Bathroom Shower/Tub: Tub/shower unit, Architectural technologist: Standard Bathroom Accessibility: Yes How Accessible: Accessible via walker Home Care Services: No Additional Comments: goes to heart clinic weekly for labs and driveline dressing change  Discharge Living Setting Plans for Discharge Living Setting: Patient's home, Lives with (comment)(sister) Type of Home at Discharge: House Discharge Home Layout: One level Discharge Home Access: Level entry(6 inch step into) Discharge Bathroom Shower/Tub: Tub/shower unit, Curtain Discharge  Bathroom Toilet: Standard Discharge Bathroom Accessibility: Yes How Accessible: Accessible via walker Does the patient have any problems obtaining your medications?: No  Social/Family/Support Systems Contact Information:  sister Anticipated Caregiver: sister Anticipated Caregiver's Contact Information: see above Ability/Limitations of Caregiver: sister can provde supervision Caregiver Availability: 24/7 Discharge Plan Discussed with Primary Caregiver: Yes Is Caregiver In Agreement with Plan?: Yes Does Caregiver/Family have Issues with Lodging/Transportation while Pt is in Rehab?: Yes  Goals/Additional Needs Patient/Family Goal for Rehab: Mod I to supervsiion PT and OT Expected length of stay: ELOS 7 to 10 days Equipment Needs: LVAD Special Service Needs: ESRD on hemodialysis M, W, Fri and SAT; must be dialyzed in patient room due to LVAD Pt/Family Agrees to Admission and willing to participate: Yes Program Orientation Provided & Reviewed with Pt/Caregiver Including Roles  & Responsibilities: Yes  Decrease burden of Care through IP rehab admission: n/a   Possible need for SNF placement upon discharge: not anticiapted; [facility] only local SNF that will take LVADs  Patient Condition: This patient's medical and functional status has changed since the consult dated 05/01/2018 in which the Rehabilitation Physician determined and documented that the patient was potentially appropriate for intensive rehabilitative care in an inpatient rehabilitation facility. Issues have been addressed and update has been discussed with Dr. Posey Pronto and patient now appropriate for inpatient rehabilitation. Will admit to inpatient rehab today.   Preadmission Screen Completed By:  Cleatrice Burke, 05/05/2018 11:36 AM ______________________________________________________________________   Discussed status with Dr. Posey Pronto on 05/05/2018 at  1144 and received telephone approval for admission  today.  Admission Coordinator:  Cleatrice Burke, time 1610 Date 05/05/2018             Cosigned by: Jamse Arn, MD at 05/05/2018 11:58 AM  Revision History

## 2018-05-05 NOTE — Progress Notes (Addendum)
Advanced Heart Failure VAD Team Note  Subjective:    INR 3.0 > 2.3 > 1.99. Heparin off. Received coumadin last night   LDH stable 251. Hgb 9.5-> 9.1 -> 9.3 -> 9.7  WBC trending back down. 12.0 this am. Afebrile. Denies fever/chills.   Feels good this morning. No more nausea with eating. Denies CP or SOB. Ortho came by yesterday but did not remove stitches yet. Had a BM yesterday. No bleeding. Not sleeping well because he is awake and worrying. Requesting trazadone.   LVAD INTERROGATION:  HeartMate-2 LVAD:   Flow 4.6 liters/min, speed 9200, power 5.6 , PI 5.1 8 PI events/24 hours. VAD interrogated personally. Parameters stable.  Objective:    Vital Signs:   Temp:  [97.8 F (36.6 C)-98.2 F (36.8 C)] 98 F (36.7 C) (07/01 1932) Pulse Rate:  [46-88] 88 (07/01 1932) Resp:  [7-19] 13 (07/01 1932) BP: (80-113)/(66-93) 80/70 (07/01 2000) SpO2:  [96 %-100 %] 98 % (07/01 1932) Weight:  [163 lb 9.3 oz (74.2 kg)-171 lb 15.3 oz (78 kg)] 163 lb 9.3 oz (74.2 kg) (07/02 0500) Last BM Date: 05/04/18 Mean arterial Pressure 70-80  Intake/Output:   Intake/Output Summary (Last 24 hours) at 05/05/2018 0717 Last data filed at 05/04/2018 1840 Gross per 24 hour  Intake 580 ml  Output 3000 ml  Net -2420 ml     Physical Exam    General:  NAD.  HEENT: Normal. Neck: Supple, JVP flat. Carotids OK.  Cardiac:  Mechanical heart sounds with LVAD hum present.  Lungs:  CTAB, normal effort.  Abdomen:  NT, ND, no HSM. No bruits or masses. +BS  LVAD exit site:Dressing dry and intact. Stabilization device present and accurately applied.  Extremities:  Warm and dry. No cyanosis, clubbing, rash, BLE 1-2+ edema, L>R, left knee surgical site pink, no drainage, sutures intact. Neuro:  Alert & oriented x 3. Cranial nerves grossly intact. Moves all 4 extremities w/o difficulty. Affect pleasant    Telemetry   Afib 80s. Personally reviewed.   Labs   Basic Metabolic Panel: Recent Labs  Lab 05/01/18 0425  05/02/18 1346 05/03/18 0804 05/04/18 0624  NA 136 135 136 131*  K 3.7 3.2* 4.0 4.3  CL 100 98 96* 93*  CO2 26 27 30 29   GLUCOSE 144* 104* 169* 169*  BUN 19 12 7* 15  CREATININE 6.09* 4.61* 3.33* 4.57*  CALCIUM 7.7* 7.3* 7.5* 7.4*  MG 1.5* 2.4  --   --     Liver Function Tests: No results for input(s): AST, ALT, ALKPHOS, BILITOT, PROT, ALBUMIN in the last 168 hours. No results for input(s): LIPASE, AMYLASE in the last 168 hours. No results for input(s): AMMONIA in the last 168 hours.  CBC: Recent Labs  Lab 05/01/18 0425 05/02/18 1346 05/03/18 0804 05/04/18 0624 05/05/18 0644  WBC 14.2* 14.0* 13.9* 15.1* 12.0*  HGB 9.5* 9.1* 9.7* 9.3* 9.7*  HCT 31.6* 30.1* 32.5* 31.1* 31.8*  MCV 96.0 95.9 96.2 97.2 96.7  PLT 147* 154 175 149* 153    INR: Recent Labs  Lab 05/01/18 0425 05/02/18 1346 05/02/18 1704 05/03/18 0804 05/04/18 0624  INR 1.54 4.51* 4.71* 3.08 2.32    Other results:  Imaging   No results found.   Medications:     Scheduled Medications: . atorvastatin  40 mg Oral q1800  . budesonide  0.25 mg Nebulization BID  . busPIRone  5 mg Oral BID  . calcitRIOL  0.5 mcg Oral Q M,W,F  . Chlorhexidine Gluconate Cloth  6 each Topical Q0600  . darbepoetin (ARANESP) injection - DIALYSIS  100 mcg Intravenous Q Mon-HD  . insulin aspart  0-15 Units Subcutaneous TID WC  . insulin aspart  0-5 Units Subcutaneous QHS  . insulin aspart  3 Units Subcutaneous TID WC  . levothyroxine  25 mcg Oral QAC breakfast  . pantoprazole (PROTONIX) IV  40 mg Intravenous Q12H  . senna-docusate  1 tablet Oral BID  . sildenafil  20 mg Oral TID  . Warfarin - Pharmacist Dosing Inpatient   Does not apply q1800    Infusions: . sodium chloride    . sodium chloride      PRN Medications: sodium chloride, sodium chloride, acetaminophen, albuterol, benzonatate, lidocaine (PF), lidocaine-prilocaine, ondansetron (ZOFRAN) IV, oxyCODONE, pentafluoroprop-tetrafluoroeth, traMADol   Patient  Profile   Trevor Line Murphyis a 72 y.o.malewith a history of CAD s/p CABG x 4 2010, OSA, AS with TAVR 2015, ,ESRD on HD,DM2,chronicatrial fibrillation, asthma,GI bleed,and ischemic cardiomyopathy with Medtronic ICD.S/PHMII LVAD for Destination therapy on 12/14/2015.  Transferred back from [place] on 6/27 after undergoing enterotomy and clipping of a colonic Dueilafoy lesion as well I&D of septic left knee.   Assessment/Plan:    1. GI Bleed/Symptomatic Anemia: GI AVMs. Double balloon retro endoscopy at [facility], unable to identify bleeding lesion. He failed attempted IR embolization x 3 while at [facility]. 12 units PRBCs at [facility]. Received 15 uPRBC while at [facility]. IR consulted, but did not find any active bleeding on CTA 5/20. Repeat capsule study showed active bleeding in the mid ileum. Surgery was reconsulted and recommended transfer back to [facility] for possible surgery.  - Now s/p laparoscopic surgical enteroscopy with 4 clips on 6/7 at [facility] - C/b ischemic bowel/SBO. Now tolerating full diet if he eats in small quantities. Had some nausea yesterday after eating. No BM yesterday.  - Heparin/coumadin restarted 6/27. INR 1.99. No bleeding.  - Hgb stable at 9.7  2. 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 S/p HM II LVAD placed 2017 for DT. Has Medtronic ICD.  - Volume status much improved after 2 sessions of HD. Resume MWF schedule - VAD interrogated personally. Parameters stable. - Volume managed by HD. No change.  - No bb with RV failure.   - Continue sildenafil 20 mg tid.   3. VAD, HM-II - VAD interrogated personally. Parameters stable. - LDH 251 - INR Goal 1.8-2.2. Coumadin dosing per pharmacy. INR 1.99 - No ASA with GIB  4. ESRD- M/W/F - HD per Renal. Switch back to MWF schedule - Appreciate Renal's help. No change.  5. Chronic A fib: - Rate controlled.No change.   6. HTN:  - MAPs 70-80  7. Septic left knee - s/p several aspirations and OR wash out x2.  Aspirate cultures positive for staph epi and clostridium perfinge. Blood cultures negative at [facility].  - On Ancef through 7/2 - Ortho saw 6/28  Stitches to be removed by ortho ?today - WBC trending back down 12.0. Afebrile.   8. DM II - SSI. No change.   9. Hx of CAD s/p CABG. - No s/s ischemia. - Continue statin  9. Hx of TAVR:  - Valve stable on last echo 12/2017. No change.   10. Deconditioning - PT/OT  & CIR are following - Planning for CIR. CIR coordinator in to speak with him this morning.   11. Hypomagnesemia - Resolved.   12. Insomnia - Improved with limiting interruptions in night - Trazadone has for him worked in the past. Will  order PRN.   I reviewed the LVAD parameters from today, and compared the results to the patient's prior recorded data.  No programming changes were made.  The LVAD is functioning within specified parameters.  The patient performs LVAD self-test daily.  LVAD interrogation was negative for any significant power changes, alarms or PI events/speed drops.  LVAD equipment check completed and is in good working order.  Back-up equipment present.   LVAD education done on emergency procedures and precautions and reviewed exit site care.  Spoke with Child psychotherapist. She is working on coordinating HD in [employer].   Length of Stay: [place], NP 05/05/2018, 7:17 AM  VAD Team --- VAD ISSUES ONLY--- Pager [phone] (7am - 7am)  Advanced Heart Failure Team  Pager [phone] (M-F; 7a - 4p)  Please contact [facility] for night-coverage after hours (4p -7a ) and weekends on [email or web address]   Patient seen and examined with the above-signed Advanced Practice Provider and/or Housestaff. I personally reviewed laboratory data, imaging studies and relevant notes. I independently examined the patient and formulated the important aspects of the plan. I have edited the note to reflect any of my changes or salient points. I have personally discussed the plan with  the patient and/or family.  Continues to improve. Volume status looks better after HD. INR 2.0. No further bleeding. Left knee doing well. WBC down. VAD interrogated personally. Parameters stable. I have discussed with CIR coordinator and will plan for transfer to CIR today for ongoing rehab. The VAD team will follow on a daily basis. Appreciate their care.   Glori Bickers, MD  6:57 PM

## 2018-05-05 NOTE — Progress Notes (Signed)
Sutures removed from L knee.   [place]  Unika Nazareno, RN

## 2018-05-05 NOTE — Progress Notes (Signed)
BG 78 after 2nd OJ; will give more OJ w/ sugar packets & check a 3rd time.   [place]  Enriqueta Augusta, RN

## 2018-05-05 NOTE — Progress Notes (Addendum)
LVAD Coordinator Rounding Note:  HM II LVAD implanted on 12/13/16 by Dr. Darcey Nora under Destination Therapy criteria due to age excluding heart transplantation.  Transferred from [facility] on 04/30/18 following laparoscopic surgical enteroscopy on 6/7 and received 4 clips. He received 13 uPRBCs but had no further bleeding post-op. Last octreotide injection 6/13. Danazol was DC'd. ASA and coumadin were not resumed. VAD parameters were stable.   Course complicated by AMS and concern for ischemic bowel. Improved with NGT decompression and bowel rest. Head CT negative. Blood cultures and UA were negative. Tolerated clears and thought stable for diet advancement.    Additionally, required multiple left knee aspirations and OR washouts. Aspirate cultures positive for staph epi and clostridium perfingen. Started on Vanc and narrowed to Ancef. Sutures reassessed by ortho on 05/01/18 with planned suture removal 05/04/18; sutures still intact.  Pt very concerned this am with "lack of plan". Unsure about CIR placement, c/o being unable to sleep. Pt reports he has used Trazadone successfully in the past for sleep. Also, reports poor appetite with dietary choices here.   [name] working with CIR to get patient placed. Meyer Cory, NP about sleep and diet issues.   Vital signs: Temp:  98.3 HR: 82 Doppler Pressure:  80 Automatic BP:  93/71 (79) O2 Sat: 99% RA Wt:171>168>169>167>163 lbs  LVAD interrogation reveals:  Speed:  9200 Flow:  4.4 Power: 5.3 w PI: 6.3 Alarms: none Events: 8 PI events  Fixed speed: 9200 Low speed limit: 8600   Drive Line: Weekly dressing changes per bedside RN. Existing VAD dressing removed and site care performed using sterile technique. Drive line exit site cleaned with Chlora prep applicators x 2, rinsed with saline, allowed to dry, and Sorbaview dressing without bio patch re-applied. Exit site healed and incorporated, the velour is fully implanted at exit site. No  redness, tenderness, drainage, foul odor or rash noted. Drive line anchor re-applied.    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 Margins not clear at neck. - 02/23/18> hospitalization for GI bleed. Colon/EGD 02/27/18 with normal EGD. Colonoscopy identified active bleeding in ileum; source not found. Capsule Endoscopy 02/27/18: Showed active bleeding in the ileum. Received 5 units blood with histamine reaction; will give benadryl as OP. - 03/10/18>transferrred to [facility] for double retro balloon scop and sent to IR emolization x 3 with no evidence of bleeding. Received 12 units PCs, remained off anticoagulants. Transferred back to [facility] 03/20/18 Seen by IR 5/20. CTA obtained, but no active bleeding found. Started on octreotide. 03/25/18: Repeat capsule endoscopy completed. Bleeding appears to come from the mid-ileum. 03/26/18: General surgery reconsulted. Recommended transfer back to [facility] for surgery with intraoperative endoscopy. [name] currently discussing options. Ortho consulted 5/24 for left knee pain. S/p knee aspiration and steroid injection.  Dose of premarin given 5/24.  On 5/25 developed abdominal bloating and distension with n/v. KUB suggestive of ileus versus early SBO. NGT placed for decompression.  Had large bloody BM after  this and felt much better, diet now advanced to regular.  - 04/01/18 transferred to [facility] for  laparoscopic surgical enteroscopy for recurrent GI  bleed   Plan/Recommendations: 1.  Weekly dressing changes with special instructions (above). Bedside RN may change dressing; next due 05/12/18. 2.  Call VAD pager if any questions re: VAD equipment or drive line site issues.   [name] RN, VAD Coordinator 24/7 VAD pager: [phone]

## 2018-05-05 NOTE — Progress Notes (Signed)
Report given to IP rehab; will accompany pt along with his belongings to [phone].   [place]  Rosangela Fehrenbach, RN

## 2018-05-05 NOTE — H&P (Signed)
Physical Medicine and Rehabilitation Admission H&P    CC: Debility due to recurrent GIB and septic left knee.    HPI: Trevor [name] is a 72 year old male with history of CAD s/p CABG, OSA, AS with TVAR '15, T2DM, CAF, ESRD- HD MWFSa, ICM s/p HMII LVAD destination therapy 12/2015, gastric AVMs with multiple episodes of GIB treated with IR embolization and most recent admission to [facility] 05/12/01 and complicated with need to return to [facility] X 2 --last on 04/01/18 for recurrent bleed. History taken from chart review and patient. Bleeding treated with clipping, 13 additional units PRBCs  and octreotide injection with hospitalization complicated by ischemic bowel, encephalopathy, septic need s/p multiple washouts --placed on ancef for staph epi and clostridium perfingen and transferred back to [facility] for management on 6/27.  Heparin/coumadin were resumed and H/H being monitored without signs of bleeding.  Hemodialysis ongoing with aggressive attempts at fluid management. Therapy ongoing and patient limited by knee pain as well as debility. CIR recommended for follow up therapy.    Review of Systems  Constitutional: Negative for chills and fever.  HENT: Negative for hearing loss and tinnitus.   Eyes: Negative for blurred vision and double vision.  Respiratory: Positive for cough (chronic since TVAR) and wheezing (chronic). Negative for shortness of breath.   Cardiovascular: Negative for chest pain and palpitations.  Gastrointestinal: Negative for constipation, heartburn and nausea.  Genitourinary: Negative for dysuria and urgency.  Musculoskeletal: Negative for myalgias.  Skin: Negative for rash.  Neurological: Positive for weakness. Negative for dizziness and headaches.  Psychiatric/Behavioral: Negative for memory loss. The patient is not nervous/anxious.   All other systems reviewed and are negative.     Past Medical History:  Diagnosis Date  . AICD (automatic cardioverter/defibrillator) present    . Asthma   . AVM (arteriovenous malformation) of colon 07/07/2017  . CHF (congestive heart failure) ([place])   . Diabetes ([place])   . ESRF (end stage renal failure) ([place]) 07/07/2017   pt receiving hemodialysis Monday- Wednesday-friday, sometimes Saturday  . Kidney disease   . LVAD (left ventricular assist device) present ([place]) 12/2016  . Permanent atrial fibrillation ([place]) 07/07/2017  . Presence of permanent cardiac pacemaker    AICD  . Sleep apnea     Past Surgical History:  Procedure Laterality Date  . AORTIC VALVE REPLACEMENT  2015   [place]  . APPLICATION OF A-CELL OF EXTREMITY Left 11/20/2017   Procedure: APPLICATION OF A-CELL;  Surgeon: Wallace Going, DO;  Location: [place];  Service: Plastics;  Laterality: Left;  . AV FISTULA PLACEMENT Left 06/30/2017   Procedure: CREATION of LEFT ARM Brachiocephalic Fistula;  Surgeon: Conrad Shiloh, MD;  Location: [place];  Service: Vascular;  Laterality: Left;  . CARDIAC CATHETERIZATION N/A 12/02/2016   Procedure: Right Heart Cath;  Surgeon: Larey Dresser, MD;  Location: [place] CV LAB;  Service: Cardiovascular;  Laterality: N/A;  . COLONOSCOPY N/A 02/14/2017   Procedure: COLONOSCOPY;  Surgeon: Milus Banister, MD;  Location: [place];  Service: Endoscopy;  Laterality: N/A;  . COLONOSCOPY WITH PROPOFOL N/A 02/27/2018   Procedure: COLONOSCOPY WITH PROPOFOL;  Surgeon: Jerene Bears, MD;  Location: [place];  Service: Gastroenterology;  Laterality: N/A;  . CORONARY ANGIOPLASTY WITH STENT PLACEMENT  2013   in [place]  . CORONARY ARTERY BYPASS GRAFT  2010   in [place]  . DIALYSIS/PERMA CATHETER INSERTION  06/30/2017   Procedure: INSERTION Dialysis Catheter;  Surgeon: Conrad Richgrove, MD;  [facility];  Service: Vascular;;  . ESOPHAGOGASTRODUODENOSCOPY (EGD) WITH PROPOFOL N/A 02/27/2018   Procedure: ESOPHAGOGASTRODUODENOSCOPY (EGD) WITH PROPOFOL;  Surgeon: Jerene Bears, MD;  Location: [facility] ENDOSCOPY;  Service: Gastroenterology;  Laterality:  N/A;  . GIVENS CAPSULE STUDY N/A 02/27/2018   Procedure: GIVENS CAPSULE STUDY;  Surgeon: Jerene Bears, MD;  Location: [place];  Service: Gastroenterology;  Laterality: N/A;  . GIVENS CAPSULE STUDY N/A 03/25/2018   Procedure: GIVENS CAPSULE STUDY;  Surgeon: Jackquline Denmark, MD;  Location: [facility] ENDOSCOPY;  Service: Endoscopy;  Laterality: N/A;  . IABP INSERTION N/A 12/11/2016   Procedure: IABP Insertion;  Surgeon: Larey Dresser, MD;  Location: [place] CV LAB;  Service: Cardiovascular;  Laterality: N/A;  . INSERTION OF IMPLANTABLE LEFT VENTRICULAR ASSIST DEVICE N/A 12/13/2016   Procedure: INSERTION OF IMPLANTABLE LEFT VENTRICULAR ASSIST DEVICE;  Surgeon: Ivin Poot, MD;  Location: [place];  Service: Open Heart Surgery;  Laterality: N/A;  HEARTMATE II  NITRIC OXIDE  . IR FLUORO GUIDE CV LINE LEFT  06/17/2017  . IR US GUIDE VASC ACCESS LEFT  06/17/2017  . MASS EXCISION Left 11/20/2017   Procedure: EXCISION OF LEFT NECK AND RIGHT LEG SKIN CANCER WITH A CELL PLACEMENT;  Surgeon: Wallace Going, DO;  Location: [place];  Service: Plastics;  Laterality: Left;  Marland Kitchen MASS EXCISION Left 11/26/2017   Procedure: EXPLORATION AND EXCISION OF LEFT NECK;  Surgeon: Wallace Going, DO;  Location: [place];  Service: Plastics;  Laterality: Left;  . RIGHT HEART CATH N/A 12/11/2016   Procedure: Right Heart Cath;  Surgeon: Larey Dresser, MD;  Location: [place] [facility];  Service: Cardiovascular;  Laterality: N/A;  . RIGHT HEART CATH N/A 03/27/2017   Procedure: Right Heart Cath;  Surgeon: Larey Dresser, MD;  Location: [place] [facility];  Service: Cardiovascular;  Laterality: N/A;  . RIGHT HEART CATH N/A 06/13/2017   Procedure: RIGHT HEART CATH;  Surgeon: Jolaine Artist, MD;  Location: [place] [facility];  Service: Cardiovascular;  Laterality: N/A;  . TEE WITHOUT CARDIOVERSION N/A 12/13/2016   Procedure: TRANSESOPHAGEAL ECHOCARDIOGRAM (TEE);  Surgeon: Ivin Poot, MD;  Location: [place];  Service: Open  Heart Surgery;  Laterality: N/A;  . TRICUSPID VALVE REPLACEMENT N/A 12/13/2016   Procedure: TRICUSPID VALVE REPAIR WITH EDWARDS [facility] 3 TRICUSPID ANNULOPLASTY RING MODEL 4900 SIZE T 28;  Surgeon: Ivin Poot, MD;  Location: [place];  Service: Open Heart Surgery;  Laterality: N/A;    Family History  Problem Relation Age of Onset  . Heart failure Father   . Heart attack Father   . Healthy Mother   . Diabetes Paternal Grandfather     Social History:  Lives with sister (has health problems). Retired. He reports that he has never smoked. He has never used smokeless tobacco. He reports that he does not drink alcohol or use drugs.    Allergies: No Known Allergies    Medications Prior to Admission  Medication Sig Dispense Refill  . acetaminophen (TYLENOL) 325 MG tablet Take 2 tablets (650 mg total) by mouth every 4 (four) hours as needed for headache or mild pain.    Marland Kitchen albuterol (PROVENTIL HFA;VENTOLIN HFA) 108 (90 Base) MCG/ACT inhaler Inhale 2 puffs into the lungs every 4 (four) hours as needed for wheezing or shortness of breath. 3 Inhaler 5  . atorvastatin (LIPITOR) 40 MG tablet TAKE 1 TABLET(40 MG) BY MOUTH DAILY (Patient taking differently: Take 40 mg by mouth daily at  6 PM. ) 30 tablet 11  . busPIRone (BUSPAR) 5 MG tablet Take 1 tablet (5 mg total) by mouth 2 (two) times daily. 180 tablet 3  . calcitRIOL (ROCALTROL) 0.5 MCG capsule Take 1 capsule (0.5 mcg total) by mouth every Monday, Wednesday, and Friday with hemodialysis.    Marland Kitchen danazol (DANOCRINE) 100 MG capsule Take 1 capsule (100 mg total) by mouth 2 (two) times daily. 60 capsule 5  . Darbepoetin Alfa (ARANESP) 200 MCG/0.4ML SOSY injection Inject 0.4 mLs (200 mcg total) into the vein every Monday with hemodialysis. 1.68 mL   . diphenhydrAMINE (BENADRYL) 25 mg capsule Take 1 capsule (25 mg total) by mouth as needed for allergies (30 min prior to blood transfusion). 30 capsule 0  . fluticasone (FLOVENT HFA) 110 MCG/ACT inhaler Inhale 2  puffs into the lungs 2 (two) times daily. 3 Inhaler 5  . gabapentin (NEURONTIN) 600 MG tablet Take 0.5 tablets (300 mg total) by mouth 2 (two) times daily.    Marland Kitchen guaiFENesin (ROBITUSSIN) 100 MG/5ML SOLN Take 5 mLs (100 mg total) by mouth every 4 (four) hours as needed for cough or to loosen phlegm. 1200 mL 0  . heparin 1000 unit/mL SOLN injection 1 mL (1,000 Units total) by Dialysis route as needed (in dialysis).    . insulin glargine (LANTUS) 100 UNIT/ML injection Inject 0.08 mLs (8 Units total) into the skin at bedtime. 10 mL 11  . levothyroxine (SYNTHROID, LEVOTHROID) 25 MCG tablet Take 1 tablet (25 mcg total) by mouth daily before breakfast. 90 tablet 3  . magnesium oxide (MAG-OX) 400 MG tablet Take 400 mg by mouth daily.    . metoCLOPramide (REGLAN) 10 MG/10ML SOLN Take 5 mLs (5 mg total) by mouth 4 (four) times daily -  before meals and at bedtime. 600 mL 0  . mometasone-formoterol (DULERA) 200-5 MCG/ACT AERO Inhale 2 puffs into the lungs 2 (two) times daily.    . multivitamin (RENA-VIT) TABS tablet Take 1 tablet by mouth at bedtime. 30 tablet 6  . octreotide 500 mcg in sodium chloride 0.9 % 250 mL Inject 50 mcg/hr into the vein continuous.    . ondansetron (ZOFRAN) 4 MG/2ML SOLN injection Inject 2 mLs (4 mg total) into the vein every 6 (six) hours as needed for nausea. 2 mL 0  . predniSONE (DELTASONE) 20 MG tablet Take 3 tablets (60 mg total) by mouth daily with breakfast.    . sildenafil (REVATIO) 20 MG tablet Take 2 tablets (40 mg total) 3 (three) times daily by mouth. (Patient taking differently: Take 40 mg by mouth See admin instructions. Take two tablets (40 mg) by mouth three times a day- midday, afternoon, and bedtime) 180 tablet 6  . traMADol (ULTRAM) 50 MG tablet Take 1 tablet (50 mg total) by mouth every 12 (twelve) hours as needed. TAKE 1 TABLET BY MOUTH EVERY 6 HOURS AS NEEDED FOR MODERATE PAIN (Patient taking differently: Take 50 mg by mouth every 6 (six) hours as needed for moderate  pain. ) 60 tablet 3  . traZODone (DESYREL) 100 MG tablet TAKE 1 TABLET BY MOUTH EVERY NIGHT AT BEDTIME AS NEEDED FOR SLEEP (Patient taking differently: TAKE 1 TABLET BY MOUTH EVERY NIGHT AT BEDTIME) 30 tablet 0  . [DISCONTINUED] pantoprazole (PROTONIX) 40 MG tablet Take 1 tablet (40 mg total) by mouth daily. 30 tablet 5    Drug Regimen Review  Drug regimen was reviewed and remains appropriate with no significant issues identified  Home: Home Living Family/patient expects to be  discharged to:: Private residence Living Arrangements: (lives with his sister) Available Help at Discharge: Family, Available 24 hours/day Type of Home: House Home Access: Level entry Home Layout: One level Bathroom Shower/Tub: Tub/shower unit, Architectural technologist: Programmer, systems: Yes Home Equipment: Shower seat Additional Comments: goes to heart clinic weekly for labs and driveline dressing change  Lives With: (sister)   Functional History: Prior Function Level of Independence: Independent Comments: pt indep with driving, ADLs, and mobility. no AD needed  Functional Status:  Mobility: Bed Mobility Overal bed mobility: Needs Assistance Bed Mobility: Supine to Sit Supine to sit: Supervision, HOB elevated General bed mobility comments: pt up in chair upon PT arrival Transfers Overall transfer level: Needs assistance Equipment used: Rolling walker (2 wheeled), 4-wheeled walker Transfers: Sit to/from Stand Sit to Stand: Min guard, Min assist Stand pivot transfers: Min guard, Min assist General transfer comment: minguard from chair with armrests, min from chair without armrests Ambulation/Gait Ambulation/Gait assistance: Min guard Gait Distance (Feet): 24 Feet Assistive device: Rolling walker (2 wheeled) Gait Pattern/deviations: Step-to pattern, Decreased stance time - left General Gait Details: pt able to perform sequence without cues, guarding for safety, decreased speed Gait  velocity: decreased Gait velocity interpretation: <1.31 ft/sec, indicative of household ambulator    ADL: ADL Overall ADL's : Needs assistance/impaired Eating/Feeding: Independent Grooming: Wash/dry hands, Wash/dry face, Oral care, Brushing hair, Min guard, Standing Upper Body Bathing: Set up, Supervision/ safety, Sitting Lower Body Bathing: Moderate assistance, Sit to/from stand Lower Body Bathing Details (indicate cue type and reason): unable to access Lt foot  Upper Body Dressing : Set up, Sitting Lower Body Dressing: Moderate assistance, Sit to/from stand Lower Body Dressing Details (indicate cue type and reason): unable to access Lt foot  Toilet Transfer: Min guard, Ambulation, Comfort height toilet, Grab bars, RW Toileting- Clothing Manipulation and Hygiene: Minimal assistance, Sit to/from stand Functional mobility during ADLs: Min guard, Rolling walker  Cognition: Cognition Overall Cognitive Status: Within Functional Limits for tasks assessed Orientation Level: Oriented X4 Cognition Arousal/Alertness: Awake/alert Behavior During Therapy: WFL for tasks assessed/performed Overall Cognitive Status: Within Functional Limits for tasks assessed General Comments: pt expresses his frustration but reports he's continuing to try to stay posisitve   Blood pressure 92/63, pulse (!) 40, temperature 98.3 F (36.8 C), temperature source Oral, resp. rate 18, height _0  (1.651 m), weight 74.2 kg (163 lb 9.3 oz), SpO2 99 %. Physical Exam  Nursing note and vitals reviewed. Constitutional: He appears well-developed and well-nourished.  HENT:  Head: Normocephalic and atraumatic.  Eyes: EOM are normal. Right eye exhibits no discharge. Left eye exhibits no discharge.  Neck: Normal range of motion. Neck supple.  Cardiovascular:  + Hum Irregularly irregular  Respiratory: Effort normal.  Occasional audible upper airway wheeze.  +[place]  GI: Soft. Bowel sounds are normal.  Musculoskeletal:    2+ pitting edema pedally and LLE.  1+ pitting edema RLE.   Neurological: He is alert.  Motor: B/l UE: 4+/5 proximal to distal LLE: 4/4 proximal to distal RLE: HF 2+/5, knee limited by pain, ADF 4/5  Skin:  Left knee warm to touch with erythema and embedded sutures.  No drainage.  BLE with dry flaky skin.  Vascular changes b/l LE  Psychiatric: He has a normal mood and affect. His behavior is normal.    Results for orders placed or performed during the hospital encounter of 04/30/18 (from the past 48 hour(s))  Glucose, capillary     Status: Abnormal   Collection Time:  05/03/18  1:02 PM  Result Value Ref Range   Glucose-Capillary 143 (H) 70 - 99 mg/dL   Comment 1 Notify RN    Comment 2 Document in Chart   Glucose, capillary     Status: Abnormal   Collection Time: 05/03/18  5:07 PM  Result Value Ref Range   Glucose-Capillary 51 (L) 70 - 99 mg/dL   Comment 1 Notify RN    Comment 2 Document in Chart   Glucose, capillary     Status: Abnormal   Collection Time: 05/03/18  5:32 PM  Result Value Ref Range   Glucose-Capillary 59 (L) 70 - 99 mg/dL   Comment 1 Notify RN    Comment 2 Document in Chart   Glucose, capillary     Status: Abnormal   Collection Time: 05/03/18  6:15 PM  Result Value Ref Range   Glucose-Capillary 109 (H) 70 - 99 mg/dL  Glucose, capillary     Status: Abnormal   Collection Time: 05/03/18  9:03 PM  Result Value Ref Range   Glucose-Capillary 162 (H) 70 - 99 mg/dL  Basic metabolic panel     Status: Abnormal   Collection Time: 05/04/18  6:24 AM  Result Value Ref Range   Sodium 131 (L) 135 - 145 mmol/L   Potassium 4.3 3.5 - 5.1 mmol/L   Chloride 93 (L) 98 - 111 mmol/L    Comment: Please note change in reference range.   CO2 29 22 - 32 mmol/L   Glucose, Bld 169 (H) 70 - 99 mg/dL    Comment: Please note change in reference range.   BUN 15 8 - 23 mg/dL    Comment: Please note change in reference range.   Creatinine, Ser 4.57 (H) 0.61 - 1.24 mg/dL   Calcium 7.4  (L) 8.9 - 10.3 mg/dL   GFR calc non Af Amer 12 (L) >60 mL/min   GFR calc Af Amer 13 (L) >60 mL/min    Comment: (NOTE) The eGFR has been calculated using the CKD EPI equation. This calculation has not been validated in all clinical situations. eGFR's persistently <60 mL/min signify possible Chronic Kidney Disease.    Anion gap 9 5 - 15    Comment: Performed at [facility] [address], [place], [place] [address]  CBC     Status: Abnormal   Collection Time: 05/04/18  6:24 AM  Result Value Ref Range   WBC 15.1 (H) 4.0 - 10.5 K/uL   RBC 3.20 (L) 4.22 - 5.81 MIL/uL   Hemoglobin 9.3 (L) 13.0 - 17.0 g/dL   HCT 31.1 (L) 39.0 - 52.0 %   MCV 97.2 78.0 - 100.0 fL   MCH 29.1 26.0 - 34.0 pg   MCHC 29.9 (L) 30.0 - 36.0 g/dL   RDW 19.9 (H) 11.5 - 15.5 %   Platelets 149 (L) 150 - 400 K/uL    Comment: Performed at [facility], [place] [address] [place], [place] [address]  Lactate dehydrogenase     Status: Abnormal   Collection Time: 05/04/18  6:24 AM  Result Value Ref Range   LDH 260 (H) 98 - 192 U/L    Comment: Performed at [facility], [place] [address] [place], [place] [address]  Protime-INR     Status: Abnormal   Collection Time: 05/04/18  6:24 AM  Result Value Ref Range   Prothrombin Time 25.2 (H) 11.4 - 15.2 seconds   INR 2.32     Comment: Performed at  [facility], [place] [address] [place], [place] [address]  Glucose, capillary     Status: Abnormal   Collection Time: 05/04/18  8:11 AM  Result Value Ref Range   Glucose-Capillary 159 (H) 70 - 99 mg/dL   Comment 1 Notify RN    Comment 2 Document in Chart   Glucose, capillary     Status: None   Collection Time: 05/04/18 12:00 PM  Result Value Ref Range   Glucose-Capillary 89 70 - 99 mg/dL   Comment 1 Notify RN    Comment 2 Document in Chart   Glucose, capillary     Status: Abnormal   Collection Time: 05/04/18  5:23 PM  Result Value Ref Range   Glucose-Capillary 100 (H) 70 - 99 mg/dL   Comment 1 Notify RN     Comment 2 Document in Chart   Glucose, capillary     Status: Abnormal   Collection Time: 05/04/18  9:17 PM  Result Value Ref Range   Glucose-Capillary 115 (H) 70 - 99 mg/dL   Comment 1 Notify RN   Basic metabolic panel     Status: Abnormal   Collection Time: 05/05/18  6:44 AM  Result Value Ref Range   Sodium 135 135 - 145 mmol/L   Potassium 4.2 3.5 - 5.1 mmol/L   Chloride 96 (L) 98 - 111 mmol/L    Comment: Please note change in reference range.   CO2 31 22 - 32 mmol/L   Glucose, Bld 150 (H) 70 - 99 mg/dL    Comment: Please note change in reference range.   BUN 7 (L) 8 - 23 mg/dL    Comment: Please note change in reference range.   Creatinine, Ser 3.29 (H) 0.61 - 1.24 mg/dL   Calcium 7.5 (L) 8.9 - 10.3 mg/dL   GFR calc non Af Amer 17 (L) >60 mL/min   GFR calc Af Amer 20 (L) >60 mL/min    Comment: (NOTE) The eGFR has been calculated using the CKD EPI equation. This calculation has not been validated in all clinical situations. eGFR's persistently <60 mL/min signify possible Chronic Kidney Disease.    Anion gap 8 5 - 15    Comment: Performed at [facility] [address], [place], [place] [address]  CBC     Status: Abnormal   Collection Time: 05/05/18  6:44 AM  Result Value Ref Range   WBC 12.0 (H) 4.0 - 10.5 K/uL   RBC 3.29 (L) 4.22 - 5.81 MIL/uL   Hemoglobin 9.7 (L) 13.0 - 17.0 g/dL   HCT 31.8 (L) 39.0 - 52.0 %   MCV 96.7 78.0 - 100.0 fL   MCH 29.5 26.0 - 34.0 pg   MCHC 30.5 30.0 - 36.0 g/dL   RDW 20.1 (H) 11.5 - 15.5 %   Platelets 153 150 - 400 K/uL    Comment: Performed at [facility], [place] [address], [place], [place] [address]  Lactate dehydrogenase     Status: Abnormal   Collection Time: 05/05/18  6:44 AM  Result Value Ref Range   LDH 251 (H) 98 - 192 U/L    Comment: Performed at [facility] [address] [place], [place] [address]  Protime-INR     Status: Abnormal   Collection Time: 05/05/18  6:44 AM  Result Value Ref Range    Prothrombin Time 22.5 (H) 11.4 - 15.2 seconds   INR 1.99     Comment: Performed at [facility], [address], [place], [place] [address]  Glucose, capillary     Status: Abnormal   Collection Time: 05/05/18  8:17 AM  Result Value Ref Range   Glucose-Capillary 150 (H) 70 - 99 mg/dL  Glucose, capillary     Status: None   Collection Time: 05/05/18 11:56 AM  Result Value Ref Range   Glucose-Capillary 95 70 - 99 mg/dL   No results found.     Medical Problem List and Plan: 1.  Deficits with mobility, self-care secondary to LVAD as well as left knee infection/aspiration. 2.  DVT Prophylaxis/Anticoagulation: Pharmaceutical: Coumadin. Being followed closely by pharmacy. INR labile 3. Pain Management: continue oxycodone and/or ultram for pain. Monitor for signs of overdose as HD dependent.  4. Mood: team to provide ego support. LCSW to follow for evaluation and support.  5. Neuropsych: This patient appears capable of making decisions on his own behalf. 6. Skin/Wound Care: routine pressure relief measures.  7. Fluids/Electrolytes/Nutrition: Renal diet --decrease fluid restriction to 1200.Marland Kitchen  Monitor weights daily. Strict I/O.  8. GIB due to AVMs: On Protonix BID. Continue to monitor stools for melena/signs of bleeding. Augment bowel program as needed 9. CAD s/p CABG/ICM--s/p LVAD: Strict I/O with daily weights. LVAD self test daily. Monitor LVAD per protocol 10 T2DM: Not on lantus at this time. Continue 3 units with meals and moderate SSI. NO hypoglycemic episodes for past 24 hours with improved intake.  11. Septic Knee: Has completed antibiotic course on 05/04/18 12. ESRD: HD MWFSa to help with fluid overload/anasarca. .  13. Sleep disturbance: Will schedule trazodone as at home.   14. Anxiety disorder: continue Buspar tid.    Post Admission Physician Evaluation: 1. Preadmission assessment reviewed and changes made below. 2. Functional deficits secondary  to LVAD as well as left knee  infection/aspiration. 3. Patient is admitted to receive collaborative, interdisciplinary care between the physiatrist, rehab nursing staff, and therapy team. 4. Patient's level of medical complexity and substantial therapy needs in context of that medical necessity cannot be provided at a lesser intensity of care such as a SNF. 5. Patient has experienced substantial functional loss from his/her baseline which was documented above under the "Functional History" and "Functional Status" headings.  Judging by the patient's diagnosis, physical exam, and functional history, the patient has potential for functional progress which will result in measurable gains while on inpatient rehab.  These gains will be of substantial and practical use upon discharge  in facilitating mobility and self-care at the household level. 6. Physiatrist will provide 24 hour management of medical needs as well as oversight of the therapy plan/treatment and provide guidance as appropriate regarding the interaction of the two. 7. 24 hour rehab nursing will assist with safety, skin/wound care, disease management, pain management and patient education  and help integrate therapy concepts, techniques,education, etc. 8. PT will assess and treat for/with: Lower extremity strength, range of motion, stamina, balance, functional mobility, safety, adaptive techniques and equipment, wound care, coping skills, pain control, education. Goals are: Mod I. 9. OT will assess and treat for/with: ADL's, functional mobility, safety, upper extremity strength, adaptive techniques and equipment, wound mgt, ego support, and community reintegration.   Goals are: Mod I. Therapy may not proceed with showering this patient. 10. Case Management and Social Worker will assess and treat for psychological issues and discharge planning. 11. Team conference will be held weekly to assess progress toward goals and to determine barriers to discharge. 12. Patient will  receive at least 3  hours of therapy per day at least 5 days per week. 13. ELOS: 7-12 days.       14. Prognosis:  good  I have personally performed a face to face diagnostic evaluation, including, but not limited to relevant history and physical exam findings, of this patient and developed relevant assessment and plan.  Additionally, I have reviewed and concur with the physician assistant's documentation above.  Delice Lesch, MD, ABPMR Bary Leriche, PA-C 05/05/2018

## 2018-05-05 NOTE — Plan of Care (Signed)
L knee warmth dec some from previous days; pain reduced to 5/10 with meds; PT/OT working with pt - continues to practice flexibility/strengthening movements; VAD numbers WDL; will continue to monitor.  [place]  Hedy Garro, RN

## 2018-05-05 NOTE — Progress Notes (Addendum)
Inpatient Rehabilitation Admissions Coordinator  I met with patient at bedside to discuss goals and expectations of an inpt rehab admit. He prefers an inpt rehab admission prior to return home with his sister. I discussed with Rehab team as well as Director of hemodialysis unit, Ryanne, to arrange dialysis in room of the inpt rehab center. I have discussed with [name] to arrange d/c to rehab today. RN CM, Aldona Bar made aware.   Danne Baxter, RN, MSN Rehab Admissions Coordinator [phone] 05/05/2018 10:12 AM

## 2018-05-05 NOTE — Progress Notes (Signed)
  Spoke with Orthopedic PA Silvestre Gunner personally.   OK to remove knee sutures.     Legrand Como [address]" [place], [place] 05/05/2018 2:33 PM

## 2018-05-06 ENCOUNTER — Inpatient Hospital Stay (HOSPITAL_COMMUNITY): Payer: Medicare Other | Admitting: Physical Therapy

## 2018-05-06 ENCOUNTER — Other Ambulatory Visit: Payer: Self-pay

## 2018-05-06 ENCOUNTER — Encounter (HOSPITAL_COMMUNITY): Payer: Self-pay | Admitting: Neurology

## 2018-05-06 ENCOUNTER — Inpatient Hospital Stay (HOSPITAL_COMMUNITY): Payer: Medicare Other | Admitting: Occupational Therapy

## 2018-05-06 DIAGNOSIS — E119 Type 2 diabetes mellitus without complications: Secondary | ICD-10-CM

## 2018-05-06 DIAGNOSIS — N186 End stage renal disease: Secondary | ICD-10-CM

## 2018-05-06 DIAGNOSIS — R5381 Other malaise: Secondary | ICD-10-CM

## 2018-05-06 DIAGNOSIS — Z95811 Presence of heart assist device: Secondary | ICD-10-CM

## 2018-05-06 DIAGNOSIS — I255 Ischemic cardiomyopathy: Secondary | ICD-10-CM

## 2018-05-06 DIAGNOSIS — D62 Acute posthemorrhagic anemia: Secondary | ICD-10-CM

## 2018-05-06 DIAGNOSIS — I5022 Chronic systolic (congestive) heart failure: Secondary | ICD-10-CM

## 2018-05-06 DIAGNOSIS — D72829 Elevated white blood cell count, unspecified: Secondary | ICD-10-CM

## 2018-05-06 DIAGNOSIS — Z992 Dependence on renal dialysis: Secondary | ICD-10-CM

## 2018-05-06 DIAGNOSIS — Z8719 Personal history of other diseases of the digestive system: Secondary | ICD-10-CM

## 2018-05-06 LAB — BASIC METABOLIC PANEL
ANION GAP: 7 (ref 5–15)
BUN: 15 mg/dL (ref 8–23)
CALCIUM: 7.4 mg/dL — AB (ref 8.9–10.3)
CO2: 29 mmol/L (ref 22–32)
Chloride: 96 mmol/L — ABNORMAL LOW (ref 98–111)
Creatinine, Ser: 4.43 mg/dL — ABNORMAL HIGH (ref 0.61–1.24)
GFR calc Af Amer: 14 mL/min — ABNORMAL LOW (ref >60)
GFR, EST NON AFRICAN AMERICAN: 12 mL/min — AB (ref >60)
GLUCOSE: 167 mg/dL — AB (ref 70–99)
Potassium: 4.1 mmol/L (ref 3.5–5.1)
SODIUM: 132 mmol/L — AB (ref 135–145)

## 2018-05-06 LAB — PROTIME-INR
INR: 2
PROTHROMBIN TIME: 22.5 s — AB (ref 11.4–15.2)

## 2018-05-06 LAB — CBC
HCT: 32.1 % — ABNORMAL LOW (ref 39.0–52.0)
HEMOGLOBIN: 9.6 g/dL — AB (ref 13.0–17.0)
MCH: 28.8 pg (ref 26.0–34.0)
MCHC: 29.9 g/dL — AB (ref 30.0–36.0)
MCV: 96.4 fL (ref 78.0–100.0)
Platelets: 166 10*3/uL (ref 150–400)
RBC: 3.33 MIL/uL — AB (ref 4.22–5.81)
RDW: 19.8 % — ABNORMAL HIGH (ref 11.5–15.5)
WBC: 11.9 10*3/uL — ABNORMAL HIGH (ref 4.0–10.5)

## 2018-05-06 LAB — GLUCOSE, CAPILLARY
GLUCOSE-CAPILLARY: 162 mg/dL — AB (ref 70–99)
Glucose-Capillary: 96 mg/dL (ref 70–99)
Glucose-Capillary: 184 mg/dL — ABNORMAL HIGH (ref 70–99)

## 2018-05-06 LAB — LACTATE DEHYDROGENASE: LDH: 241 U/L — AB (ref 98–192)

## 2018-05-06 MED ORDER — HYDROCERIN EX CREA
TOPICAL_CREAM | Freq: Two times a day (BID) | CUTANEOUS | Status: DC
  Administered 2018-05-06: 13:00:00 via TOPICAL
  Administered 2018-05-08: 1 via TOPICAL
  Administered 2018-05-10 – 2018-05-18 (×12): via TOPICAL
  Filled 2018-05-06: qty 113

## 2018-05-06 MED ORDER — TRAZODONE HCL 50 MG PO TABS
25.0000 mg | ORAL_TABLET | Freq: Every day | ORAL | Status: DC
  Administered 2018-05-06 – 2018-05-18 (×13): 25 mg via ORAL
  Filled 2018-05-06 (×13): qty 1

## 2018-05-06 MED ORDER — WARFARIN SODIUM 3 MG PO TABS
3.0000 mg | ORAL_TABLET | Freq: Once | ORAL | Status: AC
  Administered 2018-05-06: 3 mg via ORAL
  Filled 2018-05-06: qty 1

## 2018-05-06 NOTE — Progress Notes (Signed)
Patient information reviewed and entered into eRehab system by Noralyn Karim, RN, CRRN, PPS Coordinator.  Information including medical coding and functional independence measure will be reviewed and updated through discharge.     Per nursing patient was given "Data Collection Information Summary for Patients in Inpatient Rehabilitation Facilities with attached "Privacy Act Statement-Health Care Records" upon admission.  

## 2018-05-06 NOTE — Progress Notes (Addendum)
Advanced Heart Failure VAD Team Note  Subjective:    INR 2.00. Hgb 9.6. LDH 241.  Feeling OK this am. L knee still tender, but stable. Remains swollen in ankles. Plan for HD this afternoon. No fevers, chills, orthopnea or PND.    LVAD Interrogation HM 2: Speed: 9200 Flow: 5.3 PI: 4.8 Power: 5.0. VAD interrogated personally. Parameters stable.    Objective:    Vital Signs:   Temp:  [98.1 F (36.7 C)-98.4 F (36.9 C)] 98.1 F (36.7 C) (07/03 0419) Pulse Rate:  [74-83] 74 (07/03 0419) Resp:  [14-18] 18 (07/03 0419) BP: (87)/(77) 87/77 (07/02 2109) SpO2:  [97 %-99 %] 97 % (07/03 0419) Weight:  [174 lb 9.7 oz (79.2 kg)] 174 lb 9.7 oz (79.2 kg) (07/03 0507) Last BM Date: 05/04/18 Mean arterial Pressure 80s  Intake/Output:   Intake/Output Summary (Last 24 hours) at 05/06/2018 0830 Last data filed at 05/05/2018 2325 Gross per 24 hour  Intake -  Output 250 ml  Net -250 ml     Physical Exam    General:  Lying in bed Well appearing. No resp difficulty HEENT: normal  Neck: supple. JVP not elevated.  Carotids 2+ bilat; no bruits. No lymphadenopathy or thryomegaly appreciated. Cor: LVAD hum.  Lungs: Clear. Abdomen: obese soft, nontender, non-distended. No hepatosplenomegaly. No bruits or masses. Good bowel sounds. Driveline site clean. Anchor in place.  Extremities: no cyanosis, clubbing, rash. 1+ edema Left knee incision with mild erythema surrounding. Sutures out.  RUE AVF Neuro: alert & oriented x 3. No focal deficits. Moves all 4 without problem    Telemetry   Not connected.  EKG    No new tracings.    Labs   Basic Metabolic Panel: Recent Labs  Lab 05/01/18 0425 05/02/18 1346 05/03/18 0804 05/04/18 0624 05/05/18 0644 05/06/18 0544  NA 136 135 136 131* 135 132*  K 3.7 3.2* 4.0 4.3 4.2 4.1  CL 100 98 96* 93* 96* 96*  CO2 26 27 30 29 31 29   GLUCOSE 144* 104* 169* 169* 150* 167*  BUN 19 12 7* 15 7* 15  CREATININE 6.09* 4.61* 3.33* 4.57* 3.29* 4.43*  CALCIUM  7.7* 7.3* 7.5* 7.4* 7.5* 7.4*  MG 1.5* 2.4  --   --   --   --     Liver Function Tests: No results for input(s): AST, ALT, ALKPHOS, BILITOT, PROT, ALBUMIN in the last 168 hours. No results for input(s): LIPASE, AMYLASE in the last 168 hours. No results for input(s): AMMONIA in the last 168 hours.  CBC: Recent Labs  Lab 05/02/18 1346 05/03/18 0804 05/04/18 0624 05/05/18 0644 05/06/18 0544  WBC 14.0* 13.9* 15.1* 12.0* 11.9*  HGB 9.1* 9.7* 9.3* 9.7* 9.6*  HCT 30.1* 32.5* 31.1* 31.8* 32.1*  MCV 95.9 96.2 97.2 96.7 96.4  PLT 154 175 149* 153 166    INR: Recent Labs  Lab 05/02/18 1704 05/03/18 0804 05/04/18 0624 05/05/18 0644 05/06/18 0544  INR 4.71* 3.08 2.32 1.99 2.00    Other results:  EKG:    Imaging    No results found.   Medications:     Scheduled Medications: . atorvastatin  40 mg Oral q1800  . budesonide  0.25 mg Nebulization BID  . busPIRone  5 mg Oral BID  . calcitRIOL  0.5 mcg Oral Q M,W,F  . Chlorhexidine Gluconate Cloth  6 each Topical Q0600  . [START ON 05/11/2018] darbepoetin (ARANESP) injection - DIALYSIS  100 mcg Intravenous Q Mon-HD  . feeding supplement (NEPRO  CARB STEADY)  237 mL Oral BID WC  . insulin aspart  0-15 Units Subcutaneous TID WC  . insulin aspart  0-5 Units Subcutaneous QHS  . insulin aspart  3 Units Subcutaneous TID WC  . levothyroxine  25 mcg Oral QAC breakfast  . multivitamin  1 tablet Oral QHS  . pantoprazole  40 mg Oral BID  . senna-docusate  1 tablet Oral BID  . sildenafil  20 mg Oral TID  . Warfarin - Pharmacist Dosing Inpatient   Does not apply q1800     Infusions:   PRN Medications:  acetaminophen, albuterol, aluminum hydroxide, benzonatate, bisacodyl, diphenhydrAMINE, guaiFENesin-dextromethorphan, lidocaine (PF), lidocaine-prilocaine, ondansetron (ZOFRAN) IV, oxyCODONE, polyethylene glycol, traMADol, traZODone   Patient Profile   Trevor Sabic Murphyis a 72 y.o.malewith a history of CAD s/p CABG x 4 2010,  OSA, AS with TAVR 2015, ,ESRD on HD,DM2,chronicatrial fibrillation, asthma,GI bleed,and ischemic cardiomyopathy with Medtronic ICD.S/PHMII LVAD for Destination therapy on 12/14/2015.  Transferred back from [place] on 6/27 after undergoing enterotomy and clipping of a colonic Dueilafoy lesion as well I&D of septic left knee.  Assessment/Plan:    1. GI Bleed/Symptomatic Anemia:GI AVMs. Double balloon retro endoscopy at [facility], unable to identify bleeding lesion. He failed attempted IR embolization x 3 while at [facility]. 12 units PRBCs at [facility]. Received 15 uPRBC while at [facility]. IR consulted, but did not find any active bleeding on CTA 5/20. Repeat capsule study showed active bleeding in the mid ileum.Surgery was reconsulted and recommended transfer back to [facility] for possible surgery. - Now s/p laparoscopic surgical enteroscopy with 4 clips on 6/7 at [facility] - C/b ischemic bowel/SBO. Now tolerating full diet if he eats in small quantities. Had some nausea yesterday after eating. No BM yesterday.  - Heparin/coumadin restarted 6/27. INR 2.00. No further bleeding at this time.  - Hgb stable at 9.6  2. 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 S/pHM II LVAD placed 2050for DT.Has Medtronic ICD. -Volume status stable. Per HD. MWF/Sat schedule - VAD interrogated personally. Parameters stable.   - No bb with RV failure.  -Continue sildenafil 20 mg tid.  3. VAD, HM-II - VAD interrogated personally. Parameters stable.   - LDH 241 - INR Goal 1.8-2.2. INR 2.00. Discussed dosing with PharmD personally.  - No ASA with GIB  4. ESRD- M/W/F -HDper Renal. Switch back to MWF/Sa schedule - Appreciate Renal's help. No change.   5. Chronic A fib: -Rate controlled.No change.   6. HTN: -MAPs 70-80s  7.Septicleftknee - s/pseveral aspirations and OR wash out x2. Aspirate cultures positive for staph epi and clostridium perfinge. Blood cultures negative at [facility]. - On Ancef  through 7/2 - Ortho saw 6/28  Stitches removed 05/05/18 - WBC trending back down 12.0. Afebrile.   8. DM II -SSI. No change.   9.Hx ofCAD s/pCABG. - No s/s of ischemia.    - Continue statin  9. Hx ofTAVR: -Valvestable on last echo2/2019. No change.   10. Deconditioning - Appreciate CIR care.   11. Hypomagnesemia - Resolved. Follow.  12. Insomnia - Improved with limiting interruptions in night - Trazadone has for him worked in the past. Continue PRN.   I reviewed the LVAD parameters from today, and compared the results to the patient's prior recorded data.  No programming changes were made.  The LVAD is functioning within specified parameters.  The patient performs LVAD self-test daily.  LVAD interrogation was negative for any significant power changes, alarms or PI events/speed drops.  LVAD equipment check  completed and is in good working order.  Back-up equipment present.   LVAD education done on emergency procedures and precautions and reviewed exit site care.   Length of Stay: 1  Annamaria Helling 05/06/2018, 8:30 AM  VAD Team --- VAD ISSUES ONLY--- Pager [phone] (7am - 7am)  Advanced Heart Failure Team  Pager [phone] (M-F; 7a - 4p)  Please contact [facility] for night-coverage after hours (4p -7a ) and weekends on [email or web address]   Patient seen and examined with the above-signed Advanced Practice Provider and/or Housestaff. I personally reviewed laboratory data, imaging studies and relevant notes. I independently examined the patient and formulated the important aspects of the plan. I have edited the note to reflect any of my changes or salient points. I have personally discussed the plan with the patient and/or family.  Continues to improve slowly. Volume status improved with HD. Left knee looks ok. MAPs good. VAD interrogated personally. Parameters stable. On coumadin INR 2.00. Discussed dosing with PharmD personally.No bleeding. Appreciate CIR's  care.  Glori Bickers, MD  6:48 PM

## 2018-05-06 NOTE — Evaluation (Addendum)
Physical Therapy Assessment and Plan  Patient Details  Name: Trevor [name] MRN: [id number] Date of Birth: August 15, 1946  PT Diagnosis: Abnormality of gait, Difficulty walking, Edema, Muscle weakness and Pain in L knee Rehab Potential: Good ELOS: 7-10 days   Today's Date: 05/06/2018 PT Individual Time: 7517-0017 AND 1300-1415 PT Individual Time Calculation (min): 35 min AND 75 min  and Today's Date: 05/06/2018 PT Missed Time: 25 Minutes Missed Time Reason: Patient fatigue   Problem List:  Patient Active Problem List   Diagnosis Date Noted  . Physical debility 05/05/2018  . Debility   . Diabetes mellitus type 2 in nonobese (HCC)   . ESRD on dialysis ([place])   . Pyogenic arthritis of left knee joint ([place])   . Sleep disturbance   . Generalized anxiety disorder   . Ileus ([place])   . Arteriovenous malformation   . GI bleed 03/09/2018  . Small bowel bleed not requiring more than 4 units of blood in 24 hours, ICU, or surgery   . Heme positive stool   . Cellulitis 12/03/2017  . 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 Chronic systolic heart failure ([place]) 09/08/2017  . ESRD (end stage renal disease) ([place]) 07/07/2017  . AVM (arteriovenous malformation) of colon 07/07/2017  . Thrombocytopenia ([place]) 07/07/2017  . Permanent atrial fibrillation ([place]) 07/07/2017  . Anemia of chronic disease 07/07/2017  . CHF (congestive heart failure), NYHA class IV ([place]) 06/13/2017  . AVM (arteriovenous malformation) of colon with hemorrhage   . Symptomatic anemia 02/10/2017  . Type 2 diabetes mellitus ([address]) 01/17/2017  . COPD (chronic obstructive pulmonary disease) ([place]) 01/17/2017  . LVAD (left ventricular assist device) present ([place]) 01/09/2017  . Presence of left ventricular assist device (LVAD) ([place])   . Palliative care encounter   . Cardiorenal syndrome with renal failure 11/28/2016  . Cardiomyopathy, ischemic 11/27/2016  . Coronary artery disease 11/27/2016  . Status post aortic valve  replacement 11/27/2016  . Asthma, chronic 11/27/2016  . OSA (obstructive sleep apnea) 11/27/2016    Past Medical History:  Past Medical History:  Diagnosis Date  . AICD (automatic cardioverter/defibrillator) present   . Asthma   . AVM (arteriovenous malformation) of colon 07/07/2017  . CHF (congestive heart failure) ([place])   . Diabetes ([place])   . ESRF (end stage renal failure) ([place]) 07/07/2017   pt receiving hemodialysis Monday- Wednesday-friday, sometimes Saturday  . Kidney disease   . LVAD (left ventricular assist device) present ([place]) 12/2016  . Permanent atrial fibrillation ([place]) 07/07/2017  . Presence of permanent cardiac pacemaker    AICD  . Sleep apnea    Past Surgical History:  Past Surgical History:  Procedure Laterality Date  . AORTIC VALVE REPLACEMENT  2015   [place]  . APPLICATION OF A-CELL OF EXTREMITY Left 11/20/2017   Procedure: APPLICATION OF A-CELL;  Surgeon: Wallace Going, DO;  Location: [place];  Service: Plastics;  Laterality: Left;  . AV FISTULA PLACEMENT Left 06/30/2017   Procedure: CREATION of LEFT ARM Brachiocephalic Fistula;  Surgeon: Conrad Munich, MD;  Location: [place];  Service: Vascular;  Laterality: Left;  . CARDIAC CATHETERIZATION N/A 12/02/2016   Procedure: Right Heart Cath;  Surgeon: Larey Dresser, MD;  Location: [place] CV LAB;  Service: Cardiovascular;  Laterality: N/A;  . COLONOSCOPY N/A 02/14/2017   Procedure: COLONOSCOPY;  Surgeon: Milus Banister, MD;  Location: [place];  Service: Endoscopy;  Laterality: N/A;  . COLONOSCOPY WITH PROPOFOL N/A 02/27/2018  Procedure: COLONOSCOPY WITH PROPOFOL;  Surgeon: Jerene Bears, MD;  Location: [place];  Service: Gastroenterology;  Laterality: N/A;  . CORONARY ANGIOPLASTY WITH STENT PLACEMENT  2013   in [place]  . CORONARY ARTERY BYPASS GRAFT  2010   in [place]  . DIALYSIS/PERMA CATHETER INSERTION  06/30/2017   Procedure: INSERTION Dialysis Catheter;  Surgeon: Conrad [place], MD;  Location: [facility]  OR;  Service: Vascular;;  . ESOPHAGOGASTRODUODENOSCOPY (EGD) WITH PROPOFOL N/A 02/27/2018   Procedure: ESOPHAGOGASTRODUODENOSCOPY (EGD) WITH PROPOFOL;  Surgeon: Jerene Bears, MD;  Location: [facility] ENDOSCOPY;  Service: Gastroenterology;  Laterality: N/A;  . GIVENS CAPSULE STUDY N/A 02/27/2018   Procedure: GIVENS CAPSULE STUDY;  Surgeon: Jerene Bears, MD;  Location: [place];  Service: Gastroenterology;  Laterality: N/A;  . GIVENS CAPSULE STUDY N/A 03/25/2018   Procedure: GIVENS CAPSULE STUDY;  Surgeon: Jackquline Denmark, MD;  Location: [facility] ENDOSCOPY;  Service: Endoscopy;  Laterality: N/A;  . IABP INSERTION N/A 12/11/2016   Procedure: IABP Insertion;  Surgeon: Larey Dresser, MD;  Location: [place] CV LAB;  Service: Cardiovascular;  Laterality: N/A;  . INSERTION OF IMPLANTABLE LEFT VENTRICULAR ASSIST DEVICE N/A 12/13/2016   Procedure: INSERTION OF IMPLANTABLE LEFT VENTRICULAR ASSIST DEVICE;  Surgeon: Ivin Poot, MD;  Location: [place];  Service: Open Heart Surgery;  Laterality: N/A;  HEARTMATE II  NITRIC OXIDE  . IR FLUORO GUIDE CV LINE LEFT  06/17/2017  . IR US GUIDE VASC ACCESS LEFT  06/17/2017  . MASS EXCISION Left 11/20/2017   Procedure: EXCISION OF LEFT NECK AND RIGHT LEG SKIN CANCER WITH A CELL PLACEMENT;  Surgeon: Wallace Going, DO;  Location: [place];  Service: Plastics;  Laterality: Left;  Marland Kitchen MASS EXCISION Left 11/26/2017   Procedure: EXPLORATION AND EXCISION OF LEFT NECK;  Surgeon: Wallace Going, DO;  Location: [place];  Service: Plastics;  Laterality: Left;  . RIGHT HEART CATH N/A 12/11/2016   Procedure: Right Heart Cath;  Surgeon: Larey Dresser, MD;  Location: [place] CV LAB;  Service: Cardiovascular;  Laterality: N/A;  . RIGHT HEART CATH N/A 03/27/2017   Procedure: Right Heart Cath;  Surgeon: Larey Dresser, MD;  Location: [place] CV LAB;  Service: Cardiovascular;  Laterality: N/A;  . RIGHT HEART CATH N/A 06/13/2017   Procedure: RIGHT HEART CATH;  Surgeon: Jolaine Artist, MD;  Location: [place] CV LAB;  Service: Cardiovascular;  Laterality: N/A;  . TEE WITHOUT CARDIOVERSION N/A 12/13/2016   Procedure: TRANSESOPHAGEAL ECHOCARDIOGRAM (TEE);  Surgeon: Ivin Poot, MD;  Location: [place];  Service: Open Heart Surgery;  Laterality: N/A;  . TRICUSPID VALVE REPLACEMENT N/A 12/13/2016   Procedure: TRICUSPID VALVE REPAIR WITH EDWARDS MC 3 TRICUSPID ANNULOPLASTY RING MODEL 4900 SIZE T 28;  Surgeon: Ivin Poot, MD;  Location: [place];  Service: Open Heart Surgery;  Laterality: N/A;    Assessment & Plan Clinical Impression: Patient is a 72 year old male with history of CAD s/p CABG, OSA, AS with TVAR '15, T2DM, CAF, ESRD- HD MWFSa, ICM s/p HMII LVAD destination therapy 12/2015, gastric AVMs with multiple episodes of GIB treated with IR embolization and most recent admission to [facility] 05/06/[facility] and complicated with need to return to [facility] X 2 --last on 04/01/18 for recurrent bleed. History taken from chart review and patient. Bleeding treated with clipping, 13 additional units PRBCs  and octreotide injection with hospitalization complicated by ischemic bowel, encephalopathy, septic need s/p multiple washouts --placed on ancef for staph epi  and clostridium perfingen and transferred back to [facility] for management on 6/27.  Heparin/coumadin were resumed and H/H being monitored without signs of bleeding.  Hemodialysis ongoing with aggressive attempts at fluid management. Therapy ongoing and patient limited by knee pain as well as debility. CIR recommended for follow up therapy. Patient transferred to CIR on 05/05/2018 .   Patient currently requires min with mobility secondary to muscle weakness and muscle joint tightness, decreased cardiorespiratoy endurance and decreased sitting balance, decreased standing balance and decreased balance strategies.  Prior to hospitalization, patient was independent  with mobility and lived with Family(sister) in a House home.  Home access is  Level  entry.  Patient will benefit from skilled PT intervention to maximize safe functional mobility, minimize fall risk and decrease caregiver burden for planned discharge home with 24 hour supervision.  Anticipate patient will benefit from follow up [place] at discharge.  PT - End of Session Activity Tolerance: Tolerates < 10 min activity, no significant change in vital signs Endurance Deficit: Yes Endurance Deficit Description: decreased PT Assessment Rehab Potential (ACUTE/IP ONLY): Good PT Barriers to Discharge: Hemodialysis;Medical stability PT Barriers to Discharge Comments: Needs to go to dialysis multiple days per week PT Patient demonstrates impairments in the following area(s): Balance;Edema;Endurance;Pain;Safety PT Transfers Functional Problem(s): Bed Mobility;Bed to Chair;Car;Furniture;Floor PT Locomotion Functional Problem(s): Stairs;Wheelchair Mobility;Ambulation PT Plan PT Intensity: Minimum of 1-2 x/day ,45 to 90 minutes PT Frequency: 5 out of 7 days PT Duration Estimated Length of Stay: 7-10 days PT Treatment/Interventions: Disease management/prevention;Ambulation/gait training;Pain management;Stair training;Wheelchair propulsion/positioning;Therapeutic Activities;Visual/perceptual remediation/compensation;Patient/family education;DME/adaptive equipment instruction;Balance/vestibular training;Cognitive remediation/compensation;Psychosocial support;UE/LE Strength taining/ROM;Skin care/wound management;Therapeutic Exercise;Functional mobility training;Community reintegration;Discharge planning;Neuromuscular re-education;Splinting/orthotics;UE/LE Coordination activities PT Transfers Anticipated Outcome(s): Mod I  PT Locomotion Anticipated Outcome(s): Supervision household gait PT Recommendation Follow Up Recommendations: Home health PT Patient destination: Home Equipment Recommended: To be determined  Skilled Therapeutic Intervention  Session 1:  Pt instructed patient in PT  Evaluation and initiated treatment intervention; see below for results. Pt limited in participation this morning 2/2 fatigue. He required increased time for all mobility 2/2 fatigue and L knee pain. Pain as detailed below. PT educated patient in Real, rehab potential, rehab goals, and discharge recommendations. Discussed largest discharge barrier at this time, L knee pain, and needing to manage that prior to safe d/c home. Pt verbalized understanding and in agreement. Ended session in supine, call bell within reach and all needs met.   Session 2:  Pt in supine and agreeable to therapy, pain 7/10 in L knee. Session focused on overall endurance, L knee pain management, and tolerance to OOB activity. Performed all mobility w/ increased time 2/2 fatigue and L knee pain. Instructed and performed w/c mobility using BUEs for increased independence while admitted on rehab and for global endurance. Pt required no verbal and set-up assist only to switch LVAD from wall to battery power. Self-propelled w/c around unit in multiple 100-150' bouts. Performed LLE AROM exercises within pain tolerable range including LAQs, heel slides, knee marches, and ankle pumps. Educated pt on using gentle AROM as a pain management technique to build up tolerance to active L knee movement. Returned to room and pt's sister present, educated her on PT POC, rehab potential, LTGs, and d/c recommendations as she will be providing anticipated supervision level assist at d/c. Pt requesting to toilet, transferred to [facility] w/ min assist and ended session on [facility], call bell within reach and all needs met. LVAD transferred back to wall power. Pt verbalized understanding to call NT when ready  to transfer to EOB and then supine, NT made aware of pt's status.   PT Evaluation Precautions/Restrictions Precautions Precautions: Other (comment);Fall Precaution Comments: LVAD Required Braces or Orthoses: Other Brace/Splint Other Brace/Splint: hinged knee  brace per pt comfort Restrictions Weight Bearing Restrictions: No General PT Amount of Missed Time (min): 25 Minutes PT Missed Treatment Reason: Patient fatigue Vital Signs Pain Pain Assessment Pain Scale: 0-10 Pain Score: 8  Pain Location: Knee Pain Orientation: Left Pain Descriptors / Indicators: Aching Pain Onset: On-going Home Living/Prior Functioning Home Living Available Help at Discharge: Family;Available 24 hours/day Type of Home: House Home Access: Level entry Home Layout: One level Bathroom Shower/Tub: Tub/shower unit;Curtain Biochemist, clinical: Standard Bathroom Accessibility: Yes  Lives With: Dance movement psychotherapist) Prior Function Level of Independence: Independent with basic ADLs;Independent with transfers;Independent with gait;Independent with homemaking with ambulation  Able to Take Stairs?: Yes Driving: Yes 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, enjoys gardening  Leisure: Hobbies-yes (Comment) Comments: Pt independent w/ driving, ADLs, and mobility...goes to heart clinic and/or dialysis 3-4 days per week  Vision/Perception  Perception Perception: Within Functional Limits Praxis Praxis: Intact  Cognition Overall Cognitive Status: Within Functional Limits for tasks assessed Arousal/Alertness: Awake/alert Orientation Level: Oriented X4 Attention: Selective Selective Attention: Appears intact Memory: Appears intact Awareness: Appears intact Problem Solving: Appears intact Safety/Judgment: Appears intact Sensation Sensation Light Touch: Appears Intact Coordination Gross Motor Movements are Fluid and Coordinated: No Fine Motor Movements are Fluid and Coordinated: Yes Coordination and Movement Description: gross motor movements impaired 2/2 L knee pain Motor  Motor Motor: Within Functional Limits Motor - Skilled Clinical Observations: generalized weakness  Mobility Bed Mobility Bed Mobility: Rolling Right;Rolling Left;Supine to Sit;Sit  to Supine Rolling Right: Supervision/verbal cueing Rolling Left: Supervision/Verbal cueing Supine to Sit: Minimal Assistance - Patient > 75% Sit to Supine: Minimal Assistance - Patient > 75% Transfers Transfers: Stand to Sit;Sit to Stand;Stand Pivot Transfers Sit to Stand: Contact Guard/Touching assist Stand to Sit: Contact Guard/Touching assist Stand Pivot Transfers: Contact Guard/Touching assist Transfer (Assistive device): Rolling walker Locomotion  Gait Ambulation: Yes Gait Assistance: Contact Guard/Touching assist Assistive device: Rolling walker Gait Gait: Yes Gait Pattern: Impaired Gait Pattern: Left flexed knee in stance;Antalgic Gait velocity: decreased Stairs / Additional Locomotion Stairs: No Architect: Yes Wheelchair Assistance: Chartered loss adjuster: Both upper extremities Wheelchair Parts Management: Supervision/cueing Distance: 150'  Trunk/Postural Assessment  Cervical Assessment Cervical Assessment: Within Functional Limits Thoracic Assessment Thoracic Assessment: Within Functional Limits Lumbar Assessment Lumbar Assessment: Within Functional Limits Postural Control Postural Control: Within Functional Limits  Balance Balance Balance Assessed: Yes Static Sitting Balance Static Sitting - Balance Support: No upper extremity supported;Feet supported Static Sitting - Level of Assistance: 5: Stand by assistance Dynamic Sitting Balance Dynamic Sitting - Balance Support: No upper extremity supported;Feet supported Dynamic Sitting - Level of Assistance: 5: Stand by assistance Static Standing Balance Static Standing - Balance Support: No upper extremity supported;During functional activity Static Standing - Level of Assistance: 5: Stand by assistance Dynamic Standing Balance Dynamic Standing - Balance Support: No upper extremity supported;During functional activity Dynamic Standing - Level of Assistance:  4: Min assist Extremity Assessment  RLE Assessment RLE Assessment: Within Functional Limits LLE Assessment LLE Assessment: Exceptions to [facility] Passive Range of Motion (PROM) Comments: Limited 2/2 L knee pain guarding Active Range of Motion (AROM) Comments: Limited 2/2 L knee pain guarding General Strength Comments: 3/5 globally, unable to formally assess 2/2 pain w/ testing    See Function Navigator for Current Functional Status.  Refer to Care Plan for Long Term Goals  Recommendations for other services: None   Discharge Criteria: Patient will be discharged from PT if patient refuses treatment 3 consecutive times without medical reason, if treatment goals not met, if there is a change in medical status, if patient makes no progress towards goals or if patient is discharged from hospital.  The above assessment, treatment plan, treatment alternatives and goals were discussed and mutually agreed upon: by patient  Sanya Kobrin K Arnette 05/06/2018, 8:48 AM

## 2018-05-06 NOTE — Progress Notes (Signed)
LVAD Coordinator Rounding Note:  HM II LVAD implanted on 12/13/16 by Dr. Darcey Nora under Destination Therapy criteria due to age excluding heart transplantation.  Transferred from [facility] on 04/30/18 following laparoscopic surgical enteroscopy on 6/7 and received 4 clips. He received 13 uPRBCs but had no further bleeding post-op. Last octreotide injection 6/13. Danazol was DC'd. ASA and coumadin were not resumed. VAD parameters were stable.   Course complicated by AMS and concern for ischemic bowel. Improved with NGT decompression and bowel rest. Head CT negative. Blood cultures and UA were negative. Tolerated clears and thought stable for diet advancement.    Additionally, required multiple left knee aspirations and OR washouts. Aspirate cultures positive for staph epi and clostridium perfingen. Started on Vanc and narrowed to Ancef. Sutures reassessed by ortho on 05/01/18 with planned suture removal 05/04/18; sutures still intact.  Pt undergoing dialysis in room. Says he slept better last night. Voiced no complaints today.  Vital signs: Temp:  97.7 HR: 74 Automatic BP:  102/74 O2 Sat: 97% RA Wt:174 lbs  LVAD interrogation reveals:  Speed:  9200 Flow:  4.9 Power: 4.5 w PI: 5.5 Alarms: none Events:  Fixed speed: 9200 Low speed limit: 8600   Drive Line:  Right abdominal dressing dry and intact; anchor intact. Weekly dressing changes per VAD coordinator. Next dressing change due 05/12/18.    Labs:  LDH trend: 297>277>282>260>251>241  INR trend: 4.51>4.71>3.08>2.32>1.99>2.0  Anticoagulation Plan: - INR Goal: 1.8-2.3 - ASA Dose: none due to hx of GI bleed - OP monthly Octreotide for hx of GI bleed  Blood Products:  None this admission  Device: - Medtronic single lead -Therapies: on 231 bpm  Arrythmias: chronic afib  Renal:  - chronic HD on M/W/F/Sat at [facility]  Adverse Events on VAD: - 02/2017> GIB- AVM clipped x2 - 07/2017> renal failure- HD  started - 11/2017>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 Margins not clear at neck. - 02/23/18> hospitalization for GI bleed. Colon/EGD 02/27/18 with normal EGD. Colonoscopy identified active bleeding in ileum; source not found. Capsule Endoscopy 02/27/18: Showed active bleeding in the ileum. Received 5 units blood with histamine reaction; will give benadryl as OP. - 03/10/18>transferrred to [facility] for double retro balloon scop and sent to IR emolization x 3 with no evidence of bleeding. Received 12 units PCs, remained off anticoagulants. Transferred back to [facility] 03/20/18 Seen by IR 5/20. CTA obtained, but no active bleeding found. Started on octreotide. 03/25/18: Repeat capsule endoscopy completed. Bleeding appears to come from the mid-ileum. 03/26/18: General surgery reconsulted. Recommended transfer back to [facility] for surgery with intraoperative endoscopy. [name] currently discussing options. Ortho consulted 5/24 for left knee pain. S/p knee aspiration and steroid injection.  Dose of premarin given 5/24.  On 5/25 developed abdominal bloating and distension with n/v. KUB suggestive of ileus versus early SBO. NGT placed for decompression.  Had large bloody BM after this and felt much better, diet now advanced to regular.  - 04/01/18 transferred to [facility] for  laparoscopic surgical enteroscopy for recurrent GI bleed   Plan/Recommendations: 1.  Weekly dressing changes per VAD coordinator. Next dressing change due 05/12/18. 2.  Call VAD pager if any questions re: VAD equipment or drive line site issues.   [name] RN, VAD Coordinator 24/7 VAD pager: [phone]

## 2018-05-06 NOTE — Progress Notes (Signed)
Patient ID: Trevor [name], male   DOB: 1946/05/08, 72 y.o.   MRN: [id number] [facility] Progress Note   Assessment/ Plan:   1.  Lower GI bleeding: Secondary to arteriovenous malformations and possibly ischemia with resolution after surgical enteroscopy with 4 clips at [facility].  Hemoglobin/hematocrit appears stable. 2. Left knee septic arthritis status post surgical washout: Continues to have some pain from surgical site, sutures removed yesterday by orthopedic surgery with satisfactory wound healing/apposition. 3. End-stage renal disease: Continue current hemodialysis schedule 4 days a week (MWFSa) with efforts to try and provide clearance/control volume status-on schedule for hemodialysis again today. 4. Hypertension/volume: He remains hypervolemic and we will continue efforts at ultrafiltration with hemodialysis that has been limited by his hypotension/LVAD status. 5. Anemia of ESRD: With history of recurrent GI bleed in the setting of end-stage renal disease on hemodialysis-status post PRBC transfusions and now on ESA. 6. Metabolic bone disease: Phosphorus currently controlled off of binders, continue VDRA for PTH control  7. 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 8. T2DM 9. OSA  Subjective:   Transferred to inpatient rehabilitation unit overnight, able to ambulate around some yesterday.   Objective:   BP (!) 87/77 (BP Location: Right Arm) Comment: Nurse notified  Pulse 74   Temp 98.1 F (36.7 C) (Oral)   Resp 18   Ht 5\' 5"  (1.651 m)   Wt 79.2 kg (174 lb 9.7 oz)   SpO2 97%   BMI 29.06 kg/m   Physical Exam: Gen: Comfortably resting in bed CVS: Pulse regular rhythm, normal rate, LVAD hum audible over precordium Resp: Clear to auscultation bilaterally, no rales/rhonchi Abd: Soft, obese, nontender Ext: 2+ pitting edema.  Left knee surgical scar healing well, left upper arm aVF with audible bruit  Labs: BMET Recent Labs  Lab  05/01/18 0425 05/02/18 1346 05/03/18 0804 05/04/18 0624 05/05/18 0644 05/06/18 0544  NA 136 135 136 131* 135 132*  K 3.7 3.2* 4.0 4.3 4.2 4.1  CL 100 98 96* 93* 96* 96*  CO2 26 27 30 29 31 29   GLUCOSE 144* 104* 169* 169* 150* 167*  BUN 19 12 7* 15 7* 15  CREATININE 6.09* 4.61* 3.33* 4.57* 3.29* 4.43*  CALCIUM 7.7* 7.3* 7.5* 7.4* 7.5* 7.4*   CBC Recent Labs  Lab 05/03/18 0804 05/04/18 0624 05/05/18 0644 05/06/18 0544  WBC 13.9* 15.1* 12.0* 11.9*  HGB 9.7* 9.3* 9.7* 9.6*  HCT 32.5* 31.1* 31.8* 32.1*  MCV 96.2 97.2 96.7 96.4  PLT 175 149* 153 166   Medications:    . atorvastatin  40 mg Oral q1800  . budesonide  0.25 mg Nebulization BID  . busPIRone  5 mg Oral BID  . calcitRIOL  0.5 mcg Oral Q M,W,F  . Chlorhexidine Gluconate Cloth  6 each Topical Q0600  . [START ON 05/11/2018] darbepoetin (ARANESP) injection - DIALYSIS  100 mcg Intravenous Q Mon-HD  . feeding supplement (NEPRO CARB STEADY)  237 mL Oral BID WC  . insulin aspart  0-15 Units Subcutaneous TID WC  . insulin aspart  0-5 Units Subcutaneous QHS  . insulin aspart  3 Units Subcutaneous TID WC  . levothyroxine  25 mcg Oral QAC breakfast  . multivitamin  1 tablet Oral QHS  . pantoprazole  40 mg Oral BID  . senna-docusate  1 tablet Oral BID  . sildenafil  20 mg Oral TID  . Warfarin - Pharmacist Dosing Inpatient   Does not apply [phone]   Ulice Dash  Posey Pronto, MD 05/06/2018, 8:56 AM

## 2018-05-06 NOTE — Evaluation (Signed)
Occupational Therapy Assessment and Plan  Patient Details  Name: Trevor [name] MRN: [id number] Date of Birth: 30-Dec-1945  OT Diagnosis: muscle weakness (generalized) Rehab Potential: Rehab Potential (ACUTE ONLY): Good ELOS: 7-10 days   Today's Date: 05/06/2018 OT Individual Time: 1105-1200 OT Individual Time Calculation (min): 55 min     Problem List:  Patient Active Problem List   Diagnosis Date Noted  . Leukocytosis   . Acute blood loss anemia   . History of GI bleed   . Physical debility 05/05/2018  . Debility   . Diabetes mellitus type 2 in nonobese (HCC)   . ESRD on dialysis ([place])   . Pyogenic arthritis of left knee joint ([place])   . Sleep disturbance   . Generalized anxiety disorder   . Ileus ([place])   . Arteriovenous malformation   . GI bleed 03/09/2018  . Small bowel bleed not requiring more than 4 units of blood in 24 hours, ICU, or surgery   . Heme positive stool   . Cellulitis 12/03/2017  . 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 Chronic systolic heart failure ([place]) 09/08/2017  . ESRD (end stage renal disease) ([place]) 07/07/2017  . AVM (arteriovenous malformation) of colon 07/07/2017  . Thrombocytopenia ([place]) 07/07/2017  . Permanent atrial fibrillation ([place]) 07/07/2017  . Anemia of chronic disease 07/07/2017  . CHF (congestive heart failure), NYHA class IV ([place]) 06/13/2017  . AVM (arteriovenous malformation) of colon with hemorrhage   . Symptomatic anemia 02/10/2017  . Type 2 diabetes mellitus ([place]) 01/17/2017  . COPD (chronic obstructive pulmonary disease) ([place]) 01/17/2017  . LVAD (left ventricular assist device) present ([place]) 01/09/2017  . Presence of left ventricular assist device (LVAD) ([place])   . Palliative care encounter   . Cardiorenal syndrome with renal failure 11/28/2016  . Cardiomyopathy, ischemic 11/27/2016  . Coronary artery disease 11/27/2016  . Status post aortic valve replacement 11/27/2016  . Asthma, chronic 11/27/2016  . OSA  (obstructive sleep apnea) 11/27/2016    Past Medical History:  Past Medical History:  Diagnosis Date  . AICD (automatic cardioverter/defibrillator) present   . Asthma   . AVM (arteriovenous malformation) of colon 07/07/2017  . CHF (congestive heart failure) ([place])   . Diabetes ([place])   . ESRF (end stage renal failure) ([place]) 07/07/2017   pt receiving hemodialysis Monday- Wednesday-friday, sometimes Saturday  . Kidney disease   . LVAD (left ventricular assist device) present ([place]) 12/2016  . Permanent atrial fibrillation ([place]) 07/07/2017  . Presence of permanent cardiac pacemaker    AICD  . Sleep apnea    Past Surgical History:  Past Surgical History:  Procedure Laterality Date  . AORTIC VALVE REPLACEMENT  2015   [place]  . APPLICATION OF A-CELL OF EXTREMITY Left 11/20/2017   Procedure: APPLICATION OF A-CELL;  Surgeon: Wallace Going, DO;  Location: [place];  Service: Plastics;  Laterality: Left;  . AV FISTULA PLACEMENT Left 06/30/2017   Procedure: CREATION of LEFT ARM Brachiocephalic Fistula;  Surgeon: Conrad Alda, MD;  Location: [place];  Service: Vascular;  Laterality: Left;  . CARDIAC CATHETERIZATION N/A 12/02/2016   Procedure: Right Heart Cath;  Surgeon: Larey Dresser, MD;  Location: [place] [facility];  Service: Cardiovascular;  Laterality: N/A;  . COLONOSCOPY N/A 02/14/2017   Procedure: COLONOSCOPY;  Surgeon: Milus Banister, MD;  Location: [place];  Service: Endoscopy;  Laterality: N/A;  . COLONOSCOPY WITH PROPOFOL N/A 02/27/2018   Procedure: COLONOSCOPY WITH PROPOFOL;  Surgeon:  Pyrtle, Lajuan Lines, MD;  Location: [facility] ENDOSCOPY;  Service: Gastroenterology;  Laterality: N/A;  . CORONARY ANGIOPLASTY WITH STENT PLACEMENT  2013   in [place]  . CORONARY ARTERY BYPASS GRAFT  2010   in [place]  . DIALYSIS/PERMA CATHETER INSERTION  06/30/2017   Procedure: INSERTION Dialysis Catheter;  Surgeon: Conrad Metcalf, MD;  Location: [facility] OR;  Service: Vascular;;  . ESOPHAGOGASTRODUODENOSCOPY (EGD)  WITH PROPOFOL N/A 02/27/2018   Procedure: ESOPHAGOGASTRODUODENOSCOPY (EGD) WITH PROPOFOL;  Surgeon: Jerene Bears, MD;  Location: [facility] ENDOSCOPY;  Service: Gastroenterology;  Laterality: N/A;  . GIVENS CAPSULE STUDY N/A 02/27/2018   Procedure: GIVENS CAPSULE STUDY;  Surgeon: Jerene Bears, MD;  Location: [place];  Service: Gastroenterology;  Laterality: N/A;  . GIVENS CAPSULE STUDY N/A 03/25/2018   Procedure: GIVENS CAPSULE STUDY;  Surgeon: Jackquline Denmark, MD;  Location: [facility] ENDOSCOPY;  Service: Endoscopy;  Laterality: N/A;  . IABP INSERTION N/A 12/11/2016   Procedure: IABP Insertion;  Surgeon: Larey Dresser, MD;  Location: [place] [facility];  Service: Cardiovascular;  Laterality: N/A;  . INSERTION OF IMPLANTABLE LEFT VENTRICULAR ASSIST DEVICE N/A 12/13/2016   Procedure: INSERTION OF IMPLANTABLE LEFT VENTRICULAR ASSIST DEVICE;  Surgeon: Ivin Poot, MD;  Location: [place];  Service: Open Heart Surgery;  Laterality: N/A;  HEARTMATE II  NITRIC OXIDE  . IR FLUORO GUIDE CV LINE LEFT  06/17/2017  . IR US GUIDE VASC ACCESS LEFT  06/17/2017  . MASS EXCISION Left 11/20/2017   Procedure: EXCISION OF LEFT NECK AND RIGHT LEG SKIN CANCER WITH A CELL PLACEMENT;  Surgeon: Wallace Going, DO;  Location: [place];  Service: Plastics;  Laterality: Left;  Marland Kitchen MASS EXCISION Left 11/26/2017   Procedure: EXPLORATION AND EXCISION OF LEFT NECK;  Surgeon: Wallace Going, DO;  Location: [place];  Service: Plastics;  Laterality: Left;  . RIGHT HEART CATH N/A 12/11/2016   Procedure: Right Heart Cath;  Surgeon: Larey Dresser, MD;  Location: [place] [facility];  Service: Cardiovascular;  Laterality: N/A;  . RIGHT HEART CATH N/A 03/27/2017   Procedure: Right Heart Cath;  Surgeon: Larey Dresser, MD;  Location: [place] [facility];  Service: Cardiovascular;  Laterality: N/A;  . RIGHT HEART CATH N/A 06/13/2017   Procedure: RIGHT HEART CATH;  Surgeon: Jolaine Artist, MD;  Location: [place] [facility];  Service:  Cardiovascular;  Laterality: N/A;  . TEE WITHOUT CARDIOVERSION N/A 12/13/2016   Procedure: TRANSESOPHAGEAL ECHOCARDIOGRAM (TEE);  Surgeon: Ivin Poot, MD;  Location: [place];  Service: Open Heart Surgery;  Laterality: N/A;  . TRICUSPID VALVE REPLACEMENT N/A 12/13/2016   Procedure: TRICUSPID VALVE REPAIR WITH EDWARDS MC 3 TRICUSPID ANNULOPLASTY RING MODEL 4900 SIZE T 28;  Surgeon: Ivin Poot, MD;  Location: [place];  Service: Open Heart Surgery;  Laterality: N/A;    Assessment & Plan Clinical Impression: Patient is a 72 y.o. year old male with history of CAD s/p CABG, OSA, AS with TVAR '15, T2DM, CAF, ESRD- HD MWFSa, ICM s/p HMII LVAD destination therapy 12/2015, gastric AVMs with multiple episodes of GIB treated with IR embolization and most recent admission to [facility] 01/03/[facility] and complicated with need to return to [facility] X 2 --last on 04/01/18 for recurrent bleed. History taken from chart review and patient. Bleeding treated with clipping, 13 additional units PRBCs  and octreotide injection with hospitalization complicated by ischemic bowel, encephalopathy, septic need s/p multiple washouts --placed on ancef for staph epi and clostridium perfingen and transferred  back to [facility] for management on 6/27.  Heparin/coumadin were resumed and H/H being monitored without signs of bleeding.  Hemodialysis ongoing with aggressive attempts at fluid management. Therapy ongoing and patient limited by knee pain as well as debility. CIR recommended for follow up therapy.    Patient transferred to CIR on 05/05/2018 .    Patient currently requires min with basic self-care skills secondary to muscle weakness, decreased cardiorespiratoy endurance and decreased standing balance, decreased balance strategies and pain.  Prior to hospitalization, patient could complete ADLs with independent .  Patient will benefit from skilled intervention to decrease level of assist with basic self-care skills prior to discharge home with care  partner.  Anticipate patient will require intermittent supervision and no further OT follow recommended.  OT - End of Session Activity Tolerance: Tolerates 30+ min activity with multiple rests Endurance Deficit: Yes Endurance Deficit Description: required frequent rest breaks OT Assessment Rehab Potential (ACUTE ONLY): Good OT Patient demonstrates impairments in the following area(s): Balance;Endurance;Motor;Pain;Safety OT Basic ADL's Functional Problem(s): Grooming;Toileting;Bathing;Dressing OT Advanced ADL's Functional Problem(s): Simple Meal Preparation OT Transfers Functional Problem(s): Toilet OT Plan OT Intensity: Minimum of 1-2 x/day, 45 to 90 minutes OT Frequency: 5 out of 7 days OT Duration/Estimated Length of Stay: 7-10 days OT Treatment/Interventions: Balance/vestibular training;Discharge planning;Disease Lawyer;Functional mobility training;Pain management;Patient/family education;Psychosocial support;Self Care/advanced ADL retraining;Skin care/wound managment;Therapeutic Activities;Therapeutic Exercise;UE/LE Strength taining/ROM;UE/LE Coordination activities OT Basic Self-Care Anticipated Outcome(s): Mod I OT Toileting Anticipated Outcome(s): Mod I OT Bathroom Transfers Anticipated Outcome(s): Mod I OT Recommendation Patient destination: Home Follow Up Recommendations: None Equipment Recommended: None recommended by OT   Skilled Therapeutic Intervention OT eval completed with discussion of rehab process, OT purpose, POC, ELOS, and goals.  ADL assessment completed to include functional transfers and grooming tasks in standing.  Pt with no clothing and declined bathing this session, pt reports sister will be bringing clothing this afternoon.  Pt completed tranfers and short distance ambulation with RW with min assist, due to pain in Lt knee.  Grooming tasks completed in standing with min guard during standing.  Pt required frequent  rest breaks throughout session due to pain and fatigue.  Pt with one episode of "wooziness" but reports that it passed quickly.  Pt left seated EOB with lunch tray setup and bed alarm set.  OT Evaluation Precautions/Restrictions  Precautions Precautions: Other (comment);Fall Precaution Comments: LVAD Required Braces or Orthoses: Other Brace/Splint Other Brace/Splint: hinged knee brace per pt comfort Restrictions Weight Bearing Restrictions: No General PT Missed Treatment Reason: Patient fatigue Vital Signs  Pain Pain Assessment Pain Scale: 0-10 Pain Score: 8  Pain Type: Acute pain Pain Location: Knee Pain Orientation: Left Pain Descriptors / Indicators: Aching Pain Onset: On-going Pain Intervention(s): RN made aware;Repositioned Home Living/Prior Functioning Home Living Family/patient expects to be discharged to:: Private residence Available Help at Discharge: Family, Available 24 hours/day Type of Home: House Home Access: Stairs to enter Technical brewer of Steps: 1 6" step Home Layout: One level Bathroom Shower/Tub: Tub/shower unit, Curtain(has tub bench and BSC) Bathroom Toilet: Standard Bathroom Accessibility: Yes Additional Comments: goes to heart clinic weekly for labs and driveline dressing change  Lives With: Family(sister) IADL History Homemaking Responsibilities: Yes Meal Prep Responsibility: Primary Laundry Responsibility: Primary Cleaning Responsibility: Primary Shopping Responsibility: Secondary Prior Function Level of Independence: Independent with basic ADLs, Independent with transfers, Independent with gait, Independent with homemaking with ambulation  Able to Take Stairs?: Yes Driving: Yes 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, enjoys gardening and  fishing Leisure: Hobbies-yes (Comment) Comments: Pt independent w/ driving, ADLs, and mobility...goes to heart clinic and/or dialysis 3-4 days per week  ADL  See Function  Navigator Vision Baseline Vision/History: Wears glasses Wears Glasses: At all times Patient Visual Report: No change from baseline Vision Assessment?: No apparent visual deficits Perception  Perception: Within Functional Limits Praxis Praxis: Intact Cognition Overall Cognitive Status: Within Functional Limits for tasks assessed Arousal/Alertness: Awake/alert Orientation Level: Person;Place;Situation Person: Oriented Place: Oriented Situation: Oriented Year: 2019 Month: July Day of Week: Correct Memory: Appears intact Immediate Memory Recall: Sock;Blue;Bed Memory Recall: Blue;Bed Memory Recall Blue: Without Cue Memory Recall Bed: Without Cue Attention: Selective Selective Attention: Appears intact Awareness: Appears intact Problem Solving: Appears intact Safety/Judgment: Appears intact Sensation Sensation Light Touch: Appears Intact Coordination Gross Motor Movements are Fluid and Coordinated: No Fine Motor Movements are Fluid and Coordinated: Yes Coordination and Movement Description: gross motor movements impaired 2/2 L knee pain Finger Nose Finger Test: WNL Motor  Motor Motor: Within Functional Limits Motor - Skilled Clinical Observations: generalized weakness Mobility  Bed Mobility Bed Mobility: Rolling Right;Rolling Left;Supine to Sit;Sit to Supine Rolling Right: Supervision/verbal cueing Rolling Left: Supervision/Verbal cueing Supine to Sit: Minimal Assistance - Patient > 75% Sit to Supine: Minimal Assistance - Patient > 75% Transfers Sit to Stand: Contact Guard/Touching assist Stand to Sit: Contact Guard/Touching assist  Trunk/Postural Assessment  Cervical Assessment Cervical Assessment: Within Functional Limits Thoracic Assessment Thoracic Assessment: Within Functional Limits Lumbar Assessment Lumbar Assessment: Within Functional Limits Postural Control Postural Control: Within Functional Limits  Balance Balance Balance Assessed: Yes Static  Sitting Balance Static Sitting - Balance Support: No upper extremity supported;Feet supported Static Sitting - Level of Assistance: 5: Stand by assistance Dynamic Sitting Balance Dynamic Sitting - Balance Support: No upper extremity supported;Feet supported Dynamic Sitting - Level of Assistance: 5: Stand by assistance Static Standing Balance Static Standing - Balance Support: No upper extremity supported;During functional activity Static Standing - Level of Assistance: 5: Stand by assistance Dynamic Standing Balance Dynamic Standing - Balance Support: No upper extremity supported;During functional activity Dynamic Standing - Level of Assistance: 4: Min assist Extremity/Trunk Assessment RUE Assessment RUE Assessment: Within Functional Limits General Strength Comments: generalized weakness LUE Assessment LUE Assessment: Within Functional Limits General Strength Comments: generalized weakness   See Function Navigator for Current Functional Status.   Refer to Care Plan for Long Term Goals  Recommendations for other services: None    Discharge Criteria: Patient will be discharged from OT if patient refuses treatment 3 consecutive times without medical reason, if treatment goals not met, if there is a change in medical status, if patient makes no progress towards goals or if patient is discharged from hospital.  The above assessment, treatment plan, treatment alternatives and goals were discussed and mutually agreed upon: by patient  Oval Moralez, [facility] 05/06/2018, 12:12 PM

## 2018-05-06 NOTE — Progress Notes (Signed)
[place] PHYSICAL MEDICINE & REHABILITATION     PROGRESS NOTE  Subjective/Complaints:  Patient seen sitting up at the edge of his bed eating breakfast this morning. He states he slept well overnight. He states he is ready to begin therapies.  ROS: denies CP, SOB, nausea, vomiting, diarrhea.  Objective: Vital Signs: Blood pressure (!) 87/77, pulse 74, temperature 98.1 F (36.7 C), temperature source Oral, resp. rate 18, height 5\' 5"  (1.651 m), weight 79.2 kg (174 lb 9.7 oz), SpO2 97 %. No results found. Recent Labs    05/05/18 0644 05/06/18 0544  WBC 12.0* 11.9*  HGB 9.7* 9.6*  HCT 31.8* 32.1*  PLT 153 166   Recent Labs    05/05/18 0644 05/06/18 0544  NA 135 132*  K 4.2 4.1  CL 96* 96*  GLUCOSE 150* 167*  BUN 7* 15  CREATININE 3.29* 4.43*  CALCIUM 7.5* 7.4*   CBG (last 3)  Recent Labs    05/05/18 1740 05/05/18 2213 05/06/18 0609  GLUCAP 106* 154* 162*    Wt Readings from Last 3 Encounters:  05/06/18 79.2 kg (174 lb 9.7 oz)  05/05/18 74.2 kg (163 lb 9.3 oz)  04/01/18 90.6 kg (199 lb 11.8 oz)    Physical Exam:  BP (!) 87/77 (BP Location: Right Arm) Comment: Nurse notified  Pulse 74   Temp 98.1 F (36.7 C) (Oral)   Resp 18   Ht 5\' 5"  (1.651 m)   Wt 79.2 kg (174 lb 9.7 oz)   SpO2 97%   BMI 29.06 kg/m  Constitutional: He appears well-developed and well-nourished.  HENT: Normocephalic and atraumatic.  Eyes: EOM are normal. No discharge.  Cardiovascular: +Hum. Irregularly irregular Respiratory: Effort normal. Clear.  GI: Soft. Bowel sounds are normal.  Musculoskeletal: B/l LE edema L>R. Mild TTP left knee Neurological: He is alert.  Motor: B/l UE: 4+/5 proximal to distal LLE: 4/4 proximal to distal RLE: HF 2+/5, knee limited by pain, ADF 4/5 Skin:  Left knee warm to touch with erythema.  No drainage.  BLE with dry flaky skin.  Vascular changes b/l LE  Psychiatric: He has a normal mood and affect. His behavior is normal.   Assessment/Plan: 1.  Functional deficits secondary to debility which require 3+ hours per day of interdisciplinary therapy in a comprehensive inpatient rehab setting. Physiatrist is providing close team supervision and 24 hour management of active medical problems listed below. Physiatrist and rehab team continue to assess barriers to discharge/monitor patient progress toward functional and medical goals.  Function:  Bathing Bathing position      Bathing parts      Bathing assist        Upper Body Dressing/Undressing Upper body dressing                    Upper body assist        Lower Body Dressing/Undressing Lower body dressing                                  Lower body assist        Toileting Toileting          Toileting assist     Transfers Chair/bed transfer             Locomotion Ambulation           Wheelchair          Cognition Comprehension Comprehension assist level:  Follows complex conversation/direction with extra time/assistive device  Expression Expression assist level: Expresses complex ideas: With extra time/assistive device  Social Interaction Social Interaction assist level: Interacts appropriately with others with medication or extra time (anti-anxiety, antidepressant).  Problem Solving Problem solving assist level: Solves complex problems: With extra time  Memory Memory assist level: Assistive device: No helper    Medical Problem List and Plan: 1.  Deficits with mobility, self-care secondary to LVAD as well as left knee infection/aspiration.  Begins CIR 2.  DVT Prophylaxis/Anticoagulation: Pharmaceutical: Coumadin. Being followed closely by pharmacy.   INR 2.0 on 7/3 3. Pain Management: continue oxycodone and/or ultram for pain. Monitor for signs of overdose as HD dependent.  4. Mood: team to provide ego support. LCSW to follow for evaluation and support.  5. Neuropsych: This patient appears capable of making decisions on his own  behalf. 6. Skin/Wound Care: routine pressure relief measures.  7. Fluids/Electrolytes/Nutrition: Renal diet --decrease fluid restriction to 1200.Marland Kitchen  Monitor weights daily. Strict I/O.  8. GIB due to AVMs with ABLA: On Protonix BID. Continue to monitor stools for melena/signs of bleeding. Augment bowel program as needed  Hemoglobin 9.6 on 7/3  Continue to monitor 9. CAD s/p CABG/ICM--s/p LVAD: Strict I/O with daily weights. LVAD self test daily. Monitor LVAD per protocol Filed Weights   05/06/18 0507  Weight: 79.2 kg (174 lb 9.7 oz)   10 T2DM: Not on lantus at this time. Continue 3 units with meals and moderate SSI.   Monitor with increased mobility  11. Septic Knee: Has completed antibiotic course on 05/04/18 12. ESRD: HD MWFSa to help with fluid overload/anasarca.  13. Sleep disturbance: Scheduled trazodone as at home.   14. Anxiety disorder: continue Buspar tid.  15. Leukocytosis  WBCs 11.9 on 7/3  Continue to monitor  LOS (Days) 1 A FACE TO FACE EVALUATION WAS PERFORMED  Ankit Lorie Phenix 05/06/2018 8:44 AM

## 2018-05-06 NOTE — Progress Notes (Signed)
ANTICOAGULATION CONSULT NOTE  Pharmacy Consult for warfarin Indication: LVAD  Patient Measurements: Height: 5\' 5"  (165.1 cm) Weight: 174 lb 9.7 oz (79.2 kg) IBW/kg (Calculated) : 61.5 Heparin Dosing Weight: 79.5 kg  Vital Signs: Temp: 97.7 F (36.5 C) (07/03 1304) Temp Source: Oral (07/03 1304) BP: 102/74 (07/03 1200) Pulse Rate: 83 (07/03 1304)  Labs: Recent Labs    05/04/18 0624 05/05/18 0644 05/06/18 0544  HGB 9.3* 9.7* 9.6*  HCT 31.1* 31.8* 32.1*  PLT 149* 153 166  LABPROT 25.2* 22.5* 22.5*  INR 2.32 1.99 2.00  CREATININE 4.57* 3.29* 4.43*    Medical History: Past Medical History:  Diagnosis Date  . AICD (automatic cardioverter/defibrillator) present   . Asthma   . AVM (arteriovenous malformation) of colon 07/07/2017  . CHF (congestive heart failure) ([place])   . Diabetes ([place])   . ESRF (end stage renal failure) ([place]) 07/07/2017   pt receiving hemodialysis Monday- Wednesday-friday, sometimes Saturday  . Kidney disease   . LVAD (left ventricular assist device) present ([place]) 12/2016  . Permanent atrial fibrillation ([place]) 07/07/2017  . Presence of permanent cardiac pacemaker    AICD  . Sleep apnea     Assessment: 45 yom who underwent HM2 LVAD implantation on 12/14/2015. Has undergoing extensive GI workup to evaluate bleeding. Was stopped on anticoagulants as of admission on 03/09/2018. Last outpatient regimen was (as of 02/17/2018): 7.5 mg daily except 10 mg on Tues/Thurs for goal 2-2.5. Goal INR was decreased to 1.8-2.3 after admission on 02/28/2018. Now s/p laparoscopic surgical enteroscopy with 4 clips on 04/10/2018.  Heparin and warfarin were resumed on 6/27 >> now off heparin infusion. Patient had only received two doses of warfarin prior to INR unexpectedly jumping (peaked at 4.71)- now INR at 2, within goal range. Dosing lower than previous PTA regimen. Hgb 9.6, plt 166, LDH stable at 241. No s/sx of bleeding.   Goal of Therapy:  INR goal: 1.8-2.2 Monitor platelets  by anticoagulation protocol: Yes   Plan:  -Order warfarin 3 mg tonight  -Daily INR -Monitor s/s bleeding  Doylene Canard, PharmD Clinical Pharmacist  Pager: [phone] Phone: [phone] 05/06/2018 1:32 PM

## 2018-05-07 ENCOUNTER — Inpatient Hospital Stay (HOSPITAL_COMMUNITY): Payer: Medicare Other | Admitting: Physical Therapy

## 2018-05-07 ENCOUNTER — Inpatient Hospital Stay (HOSPITAL_COMMUNITY): Payer: Medicare Other

## 2018-05-07 ENCOUNTER — Inpatient Hospital Stay (HOSPITAL_COMMUNITY): Payer: Medicare Other | Admitting: Occupational Therapy

## 2018-05-07 DIAGNOSIS — R791 Abnormal coagulation profile: Secondary | ICD-10-CM

## 2018-05-07 DIAGNOSIS — I9589 Other hypotension: Secondary | ICD-10-CM

## 2018-05-07 DIAGNOSIS — I959 Hypotension, unspecified: Secondary | ICD-10-CM

## 2018-05-07 LAB — PROTIME-INR
INR: 1.88
PROTHROMBIN TIME: 21.5 s — AB (ref 11.4–15.2)

## 2018-05-07 LAB — CBC
HEMATOCRIT: 29.8 % — AB (ref 39.0–52.0)
Hemoglobin: 9 g/dL — ABNORMAL LOW (ref 13.0–17.0)
MCH: 29.3 pg (ref 26.0–34.0)
MCHC: 30.2 g/dL (ref 30.0–36.0)
MCV: 97.1 fL (ref 78.0–100.0)
PLATELETS: 165 10*3/uL (ref 150–400)
RBC: 3.07 MIL/uL — AB (ref 4.22–5.81)
RDW: 19.8 % — AB (ref 11.5–15.5)
WBC: 10.9 10*3/uL — ABNORMAL HIGH (ref 4.0–10.5)

## 2018-05-07 LAB — BASIC METABOLIC PANEL
Anion gap: 7 (ref 5–15)
BUN: 9 mg/dL (ref 8–23)
CHLORIDE: 97 mmol/L — AB (ref 98–111)
CO2: 31 mmol/L (ref 22–32)
Calcium: 7.7 mg/dL — ABNORMAL LOW (ref 8.9–10.3)
Creatinine, Ser: 3.08 mg/dL — ABNORMAL HIGH (ref 0.61–1.24)
GFR, EST AFRICAN AMERICAN: 22 mL/min — AB (ref >60)
GFR, EST NON AFRICAN AMERICAN: 19 mL/min — AB (ref >60)
Glucose, Bld: 176 mg/dL — ABNORMAL HIGH (ref 70–99)
POTASSIUM: 3.7 mmol/L (ref 3.5–5.1)
Sodium: 135 mmol/L (ref 135–145)

## 2018-05-07 LAB — GLUCOSE, CAPILLARY
GLUCOSE-CAPILLARY: 158 mg/dL — AB (ref 70–99)
GLUCOSE-CAPILLARY: 132 mg/dL — AB (ref 70–99)
GLUCOSE-CAPILLARY: 145 mg/dL — AB (ref 70–99)
GLUCOSE-CAPILLARY: 93 mg/dL (ref 70–99)

## 2018-05-07 LAB — LACTATE DEHYDROGENASE: LDH: 222 U/L — ABNORMAL HIGH (ref 98–192)

## 2018-05-07 MED ORDER — WARFARIN SODIUM 3 MG PO TABS
3.0000 mg | ORAL_TABLET | Freq: Once | ORAL | Status: AC
  Administered 2018-05-07: 3 mg via ORAL
  Filled 2018-05-07: qty 1

## 2018-05-07 NOTE — Progress Notes (Signed)
Advanced Heart Failure VAD Team Note  Subjective:    Feels good today. Worked with rehab on flexibility. No bleeding. No orthopnea or PND. Weight coming down with HD   LVAD Interrogation HM 2: Speed: 9200 Flow: 5.3 PI: 5.1 Power: 6.0. VAD interrogated personally. Parameters stable.   Objective:    Vital Signs:   Temp:  [97.8 F (36.6 C)-98.6 F (37 C)] 97.8 F (36.6 C) (07/04 1500) Pulse Rate:  [50-76] 63 (07/04 1247) Resp:  [17-19] 19 (07/04 1500) BP: (65-98)/(27-77) 88/77 (07/04 1247) SpO2:  [95 %-100 %] 99 % (07/04 1500) Weight:  [74.5 kg (164 lb 3.9 oz)-77.2 kg (170 lb 3.1 oz)] 74.5 kg (164 lb 3.9 oz) (07/04 0504) Last BM Date: 05/07/18 Mean arterial Pressure 70-80s  Intake/Output:   Intake/Output Summary (Last 24 hours) at 05/07/2018 1526 Last data filed at 05/07/2018 0817 Gross per 24 hour  Intake 360 ml  Output 1769 ml  Net -1409 ml     Physical Exam    General:  Sitting in chair. NAD.  HEENT: normal  Neck: supple. JVP 7-8.  Carotids 2+ bilat; no bruits. No lymphadenopathy or thryomegaly appreciated. Cor: LVAD hum.  Lungs: Clear. Abdomen: obese soft, nontender, non-distended. No hepatosplenomegaly. No bruits or masses. Good bowel sounds. Driveline site clean. Anchor in place.  Extremities: no cyanosis, clubbing, rash. L knee surgical scar healing well mild erythema and swelling Neuro: alert & oriented x 3. No focal deficits. Moves all 4 without problem    Telemetry   Not connected.  EKG    No new tracings.    Labs   Basic Metabolic Panel: Recent Labs  Lab 05/01/18 0425 05/02/18 1346 05/03/18 0804 05/04/18 8315 05/05/18 0644 05/06/18 0544 05/07/18 0655  NA 136 135 136 131* 135 132* 135  K 3.7 3.2* 4.0 4.3 4.2 4.1 3.7  CL 100 98 96* 93* 96* 96* 97*  CO2 26 27 30 29 31 29 31   GLUCOSE 144* 104* 169* 169* 150* 167* 176*  BUN 19 12 7* 15 7* 15 9  CREATININE 6.09* 4.61* 3.33* 4.57* 3.29* 4.43* 3.08*  CALCIUM 7.7* 7.3* 7.5* 7.4* 7.5* 7.4* 7.7*    MG 1.5* 2.4  --   --   --   --   --     Liver Function Tests: No results for input(s): AST, ALT, ALKPHOS, BILITOT, PROT, ALBUMIN in the last 168 hours. No results for input(s): LIPASE, AMYLASE in the last 168 hours. No results for input(s): AMMONIA in the last 168 hours.  CBC: Recent Labs  Lab 05/03/18 0804 05/04/18 0624 05/05/18 0644 05/06/18 0544 05/07/18 0655  WBC 13.9* 15.1* 12.0* 11.9* 10.9*  HGB 9.7* 9.3* 9.7* 9.6* 9.0*  HCT 32.5* 31.1* 31.8* 32.1* 29.8*  MCV 96.2 97.2 96.7 96.4 97.1  PLT 175 149* 153 166 165    INR: Recent Labs  Lab 05/03/18 0804 05/04/18 0624 05/05/18 0644 05/06/18 0544 05/07/18 0655  INR 3.08 2.32 1.99 2.00 1.88    Other results:  EKG:    Imaging   No results found.   Medications:     Scheduled Medications: . atorvastatin  40 mg Oral q1800  . budesonide  0.25 mg Nebulization BID  . busPIRone  5 mg Oral BID  . calcitRIOL  0.5 mcg Oral Q M,W,F  . Chlorhexidine Gluconate Cloth  6 each Topical Q0600  . [START ON 05/11/2018] darbepoetin (ARANESP) injection - DIALYSIS  100 mcg Intravenous Q Mon-HD  . feeding supplement (NEPRO CARB STEADY)  237  mL Oral BID WC  . hydrocerin   Topical BID  . insulin aspart  0-15 Units Subcutaneous TID WC  . insulin aspart  0-5 Units Subcutaneous QHS  . insulin aspart  3 Units Subcutaneous TID WC  . levothyroxine  25 mcg Oral QAC breakfast  . multivitamin  1 tablet Oral QHS  . pantoprazole  40 mg Oral BID  . senna-docusate  1 tablet Oral BID  . sildenafil  20 mg Oral TID  . traZODone  25 mg Oral QHS  . warfarin  3 mg Oral ONCE-1800  . Warfarin - Pharmacist Dosing Inpatient   Does not apply q1800    Infusions:   PRN Medications: acetaminophen, albuterol, aluminum hydroxide, benzonatate, bisacodyl, diphenhydrAMINE, guaiFENesin-dextromethorphan, lidocaine (PF), lidocaine-prilocaine, ondansetron (ZOFRAN) IV, oxyCODONE, polyethylene glycol, traMADol   Patient Profile   Trevor Elvin Murphyis a 72  y.o.malewith a history of CAD s/p CABG x 4 2010, OSA, AS with TAVR 2015, ,ESRD on HD,DM2,chronicatrial fibrillation, asthma,GI bleed,and ischemic cardiomyopathy with Medtronic ICD.S/PHMII LVAD for Destination therapy on 12/14/2015.  Transferred back from [place] on 6/27 after undergoing enterotomy and clipping of a colonic Dueilafoy lesion as well I&D of septic left knee.  Assessment/Plan:    1. GI Bleed/Symptomatic Anemia:GI AVMs. Double balloon retro endoscopy at [facility], unable to identify bleeding lesion. He failed attempted IR embolization x 3 while at [facility]. 12 units PRBCs at [facility]. Received 15 uPRBC while at [facility]. IR consulted, but did not find any active bleeding on CTA 5/20. Repeat capsule study showed active bleeding in the mid ileum.Surgery was reconsulted and recommended transfer back to [facility] for possible surgery. - Now s/p laparoscopic surgical enteroscopy with 4 clips on 6/7 at [facility] - C/b ischemic bowel/SBO. Now tolerating full diet if he eats in small quantities. Had some nausea yesterday after eating. No BM yesterday.  - Off heparin. INR 1.88 No further bleeding at this time.  - Hgb 9.6-> 9.0. Follow. - Getting Aranesp  2. 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 S/pHM II LVAD placed 2064for DT.Has Medtronic ICD. -Volume status stable. Per HD. MWF/Sat schedule - VAD interrogated personally. Parameters stable.   - No bb with RV failure.  -Continue sildenafil 20 mg tid.  3. VAD, HM-II - VAD interrogated personally. Parameters stable.   - LDH 222 - INR Goal 1.8-2.2. INR 1.88. Discussed dosing with PharmD personally.  - No ASA with GIB  4. ESRD- M/W/F -HDper Renal. Switch back to MWF/Sa schedule - Appreciate Renal's help. No change.   5. Chronic A fib: -Rate controlled.No change.   6. HTN: -MAPs stable 70-80s  7.Septicleftknee - s/pseveral aspirations and OR wash out x2. Aspirate cultures positive for staph epi and  clostridium perfinge. Blood cultures negative at [facility]. - Finished Ancef  7/2 - Ortho saw 6/28  Stitches removed 05/05/18 - WBC trending down. Afebrile.   8. DM II -SSI. No change.   9.Hx ofCAD s/pCABG. - No s/s of ischemia.    - Continue statin  9. Hx ofTAVR: -Valvestable on last echo2/2019. No change.   10. Deconditioning - Appreciate CIR care.   11. Hypomagnesemia - Resolved. Follow.  12. Insomnia - Improved with limiting interruptions in night - Trazadone has for him worked in the past. Continue PRN.   I reviewed the LVAD parameters from today, and compared the results to the patient's prior recorded data.  No programming changes were made.  The LVAD is functioning within specified parameters.  The patient performs LVAD self-test daily.  LVAD  interrogation was negative for any significant power changes, alarms or PI events/speed drops.  LVAD equipment check completed and is in good working order.  Back-up equipment present.   LVAD education done on emergency procedures and precautions and reviewed exit site care.   Length of Stay: 2  Glori Bickers, MD 05/07/2018, 3:26 PM  VAD Team --- VAD ISSUES ONLY--- Pager [phone] (7am - 7am)  Advanced Heart Failure Team  Pager [phone] (M-F; 7a - 4p)  Please contact [facility] for night-coverage after hours (4p -7a ) and weekends on [email or web address]

## 2018-05-07 NOTE — Progress Notes (Signed)
Patient ID: Trevor [name], male   DOB: 1945-12-06, 72 y.o.   MRN: [id number] [facility] Progress Note   Assessment/ Plan:   1.  Lower GI bleeding: Secondary to AVMs/ischemic with resolution after surgical enteroscopy with 4 clips at [facility].  Hemoglobin/hematocrit noted with slight downward drift. 2. Left knee septic arthritis status post surgical washout: Surgical wounds healing well status post removal of sutures.  Currently admitted to CIR for ongoing rehabilitation. 3. End-stage renal disease: Continue current hemodialysis schedule 4 days a week (MWFSa) with efforts to try and provide clearance/control volume status-order for hemodialysis again tomorrow. 4. Hypertension/volume: He remains hypervolemic and we will continue efforts at ultrafiltration with hemodialysis that has been limited by his hypotension/LVAD status. 5. Anemia of ESRD: With history of recurrent GI bleed in the setting of end-stage renal disease on hemodialysis-status post PRBC transfusions and now on ESA. 6. Metabolic bone disease: Phosphorus currently controlled off of binders, continue VDRA for PTH control  7. 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 8. T2DM 9. OSA  Subjective:   Denies any acute events overnight and inquires if he can be transitioned over to a regular diet rather than heart healthy.   Objective:   BP 98/65 (BP Location: Right Arm)   Pulse 67   Temp 98.3 F (36.8 C) (Oral)   Resp 17   Ht 5\' 5"  (1.651 m)   Wt 74.5 kg (164 lb 3.9 oz)   SpO2 97%   BMI 27.33 kg/m   Physical Exam: Gen: Comfortably sitting up on the side of his bed CVS: Pulse regular rhythm, normal rate, LVAD hum audible over precordium Resp: Clear to auscultation bilaterally, no rales/rhonchi Abd: Soft, obese, nontender Ext: 2+ pitting edema left leg, 1+ edema right leg.  Left knee surgical scar healing well, left upper arm aVF with audible bruit  Labs: BMET Recent Labs  Lab  05/01/18 0425 05/02/18 1346 05/03/18 0804 05/04/18 0624 05/05/18 0644 05/06/18 0544 05/07/18 0655  NA 136 135 136 131* 135 132* 135  K 3.7 3.2* 4.0 4.3 4.2 4.1 3.7  CL 100 98 96* 93* 96* 96* 97*  CO2 26 27 30 29 31 29 31   GLUCOSE 144* 104* 169* 169* 150* 167* 176*  BUN 19 12 7* 15 7* 15 9  CREATININE 6.09* 4.61* 3.33* 4.57* 3.29* 4.43* 3.08*  CALCIUM 7.7* 7.3* 7.5* 7.4* 7.5* 7.4* 7.7*   CBC Recent Labs  Lab 05/04/18 0624 05/05/18 0644 05/06/18 0544 05/07/18 0655  WBC 15.1* 12.0* 11.9* 10.9*  HGB 9.3* 9.7* 9.6* 9.0*  HCT 31.1* 31.8* 32.1* 29.8*  MCV 97.2 96.7 96.4 97.1  PLT 149* 153 166 165   Medications:    . atorvastatin  40 mg Oral q1800  . budesonide  0.25 mg Nebulization BID  . busPIRone  5 mg Oral BID  . calcitRIOL  0.5 mcg Oral Q M,W,F  . Chlorhexidine Gluconate Cloth  6 each Topical Q0600  . [START ON 05/11/2018] darbepoetin (ARANESP) injection - DIALYSIS  100 mcg Intravenous Q Mon-HD  . feeding supplement (NEPRO CARB STEADY)  237 mL Oral BID WC  . hydrocerin   Topical BID  . insulin aspart  0-15 Units Subcutaneous TID WC  . insulin aspart  0-5 Units Subcutaneous QHS  . insulin aspart  3 Units Subcutaneous TID WC  . levothyroxine  25 mcg Oral QAC breakfast  . multivitamin  1 tablet Oral QHS  . pantoprazole  40 mg Oral BID  .  senna-docusate  1 tablet Oral BID  . sildenafil  20 mg Oral TID  . traZODone  25 mg Oral QHS  . Warfarin - Pharmacist Dosing Inpatient   Does not apply [id number]   Elmarie Shiley, MD 05/07/2018, 8:11 AM

## 2018-05-07 NOTE — Plan of Care (Signed)
  Problem: Consults Goal: Skin Care Protocol Initiated - if Braden Score 18 or less Description If consults are not indicated, leave blank or document N/A 05/07/2018 1419 by Erie Noe, LPN Outcome: Not Applicable 4/0/3979 [id number] by Erie Noe, LPN Outcome: Progressing Goal: Nutrition Consult-if indicated 05/07/2018 1419 by Erie Noe, LPN Outcome: Not Applicable 12/07/3007 [id number] by Erie Noe, LPN Outcome: Progressing

## 2018-05-07 NOTE — Plan of Care (Signed)
  Problem: Consults Goal: RH GENERAL PATIENT EDUCATION Description See Patient Education module for education specifics. Outcome: Progressing Goal: Skin Care Protocol Initiated - if Braden Score 18 or less Description If consults are not indicated, leave blank or document N/A Outcome: Progressing Goal: Nutrition Consult-if indicated Outcome: Progressing Goal: Diabetes Guidelines if Diabetic/Glucose > 140 Description If diabetic or lab glucose is > 140 mg/dl - Initiate Diabetes/Hyperglycemia Guidelines & Document Interventions  Outcome: Progressing   Problem: RH BOWEL ELIMINATION Goal: RH STG MANAGE BOWEL WITH ASSISTANCE Description STG Manage Bowel with min ssistance.  Outcome: Progressing Goal: RH STG MANAGE BOWEL W/MEDICATION W/ASSISTANCE Description STG Manage Bowel with Medication with min Assistance.  Outcome: Progressing Goal: RH STG MANAGE BOWEL W/EQUIPMENT W/ASSISTANCE Description STG Manage Bowel With Equipment With min  Assistance  Outcome: Progressing Goal: RH OTHER STG BOWEL ELIMINATION GOALS W/ASSIST Description Other STG Bowel Elimination Goals With Assistance. Outcome: Progressing   Problem: RH BLADDER ELIMINATION Goal: RH STG MANAGE BLADDER WITH ASSISTANCE Description STG Manage Bladder With Assistance Outcome: Progressing   Problem: RH SKIN INTEGRITY Goal: RH STG SKIN FREE OF INFECTION/BREAKDOWN Outcome: Progressing Goal: RH STG MAINTAIN SKIN INTEGRITY WITH ASSISTANCE Description STG Maintain Skin Integrity With Assistance. Outcome: Progressing Goal: RH STG ABLE TO PERFORM INCISION/WOUND CARE W/ASSISTANCE Description STG Able To Perform Incision/Wound Care With Assistance. Outcome: Progressing Goal: RH OTHER STG SKIN INTEGRITY GOALS W/ASSIST Description Other STG Skin Integrity Goals With Assistance. Outcome: Progressing   Problem: RH SAFETY Goal: RH STG ADHERE TO SAFETY PRECAUTIONS W/ASSISTANCE/DEVICE Description STG Adhere to Safety  Precautions With Assistance/Device. Outcome: Progressing Goal: RH STG DECREASED RISK OF FALL WITH ASSISTANCE Description STG Decreased Risk of Fall With Assistance. Outcome: Progressing   Problem: RH PAIN MANAGEMENT Goal: RH STG PAIN MANAGED AT OR BELOW PT'S PAIN GOAL Outcome: Progressing Goal: RH OTHER STG PAIN MANAGEMENT GOALS W/ASSIST Description Other STG Pain Management Goals With Assistance. Outcome: Progressing   Problem: RH KNOWLEDGE DEFICIT GENERAL Goal: RH STG INCREASE KNOWLEDGE OF SELF CARE AFTER HOSPITALIZATION Outcome: Progressing

## 2018-05-07 NOTE — Progress Notes (Signed)
Physical Therapy Session Note  Patient Details  Name: Trevor [name] MRN: [id number] Date of Birth: 1946-06-25  Today's Date: 05/07/2018 PT Individual Time: 0800-0900 PT Individual Time Calculation (min): 60 min   Short Term Goals: Week 1:  PT Short Term Goal 1 (Week 1): =LTGs due to ELOS  Skilled Therapeutic Interventions/Progress Updates:   Pt sitting EOB and agreeable to therapy, denies pain. Session focused on functional self-care tasks and pt education w/ L knee pain management while performing tasks. Pain as detailed below. Transferred to bedside commode w/ increased time 2/2 pain. Pt states pain in standing is 2x worse than yesterday's sessions, made RN aware. Pt declined ambulating to toilet 2/2 pain. Both RN and this therapist educated pt on staying on top of pain medication to eliminate break-through pain and other pain relief strategies including using w/c to get around room, rest, ice, and elevation. Pt w/ continent BM and transferred back to EOB. Provided min assist to don LE garments, needed assist to thread LLE 2/2 pain. Min assist for balance to stand and pull LE garments over hips, verbal cues to keep weight and balance on RLE. Transferred to w/c and performed self-care tasks at sink w/ set-up assist and verbal cues for energy conservation. Pt self-propelled w/c around room w/ verbal cues for obstacle avoidance and LVAD drive-line management. Discussed performing energy conservation tasks when at home especially in light of ongoing L knee pain. Pt agreeable to sit up in recliner to work on increasing tolerance to OOB activity. Ended session in recliner and in care of RN, all needs met.   Therapy Documentation Precautions:  Precautions Precautions: Other (comment), Fall Precaution Comments: LVAD Required Braces or Orthoses: Other Brace/Splint Other Brace/Splint: hinged knee brace per pt comfort Restrictions Weight Bearing Restrictions: No Vital Signs: Therapy Vitals BP: (!)  73/62 Patient Position (if appropriate): Sitting Oxygen Therapy SpO2: 96 % O2 Device: Room Air Pain: Pain Assessment Pain Scale: 0-10 Pain Score: 6  Pain Type: Acute pain Pain Location: Knee Pain Orientation: Left Pain Descriptors / Indicators: Aching;Throbbing Pain Frequency: Constant Pain Onset: On-going Patients Stated Pain Goal: 4 Pain Intervention(s): Medication (See eMAR)  See Function Navigator for Current Functional Status.   Therapy/Group: Individual Therapy  [name] K Arnette 05/07/2018, 9:48 AM

## 2018-05-07 NOTE — Progress Notes (Signed)
Physical Therapy Session Note  Patient Details  Name: Trevor [name] MRN: [id number] Date of Birth: 03-08-1946  Today's Date: 05/07/2018 PT Individual Time: 1300-1420 PT Individual Time Calculation (min): 80 min   Short Term Goals: Week 1:  PT Short Term Goal 1 (Week 1): =LTGs due to ELOS  Skilled Therapeutic Interventions/Progress Updates:   Pt supine in bed, stating his knee was really hurting.  He was willing to participate. PT educated pt on avoiding Valsalva maneuver, and to exhale during painful or exertional movements, with good results with cueing.  PT also instructed pt in diaphragmatic breathing and positive visualization with paced breathing for pain/anxiety control.  PT doffed L TED and provided retrograde massage L foot and ankle.  Pt stated this was very comfortable. PT re-donned L TED. and placed towel roll under knee for comfort  Therapeutic exercises in supine performed with bil LE to increase strength for functional mobility: 10 x 2 bil hip internal rotation with 3 second hold, R straight leg raises focusing on eccentric control, R ankle pumps against resistance, L ankle pumps in pain-free range, R hip abduction. 10 x 2   Active assistive L hip abduction with MaxiSlide under LE> active L hip abduction (range limited by pain.)  Supine> R side lying> sitting with close supervision.  Stand /step pivot with RW, min guard and VCS for best hand placement.      Pt left resting in recliner with needs at hand.  Ice packs applied to L knee, and LEs elevated.  Therapy Documentation Precautions:  Precautions Precautions: Other (comment), Fall Precaution Comments: LVAD Required Braces or Orthoses: Other Brace/Splint Other Brace/Splint: hinged knee brace per pt comfort Restrictions Weight Bearing Restrictions: No  Pain: Pain Assessment Pain Scale: 0-10 Pain Score: 6 at rest, up to 8 with movement Pain Location: Knee Pain Orientation: Left Pain Descriptors / Indicators:  Aching;Throbbing Pain Onset: On-going Patients Stated Pain Goal: 2 Pain Intervention(s): Medication (See eMAR);Repositioned, ice     See Function Navigator for Current Functional Status.   Therapy/Group: Individual Therapy  Trevor [name] 05/07/2018, 4:11 PM

## 2018-05-07 NOTE — Progress Notes (Signed)
Occupational Therapy Session Note  Patient Details  Name: Trevor [name] MRN: [id number] Date of Birth: 1946/08/02  Today's Date: 05/07/2018 OT Individual Time: 0930-1030 OT Individual Time Calculation (min): 60 min    Short Term Goals: Week 1:  OT Short Term Goal 1 (Week 1): STG = LTGs due to ELOS  Skilled Therapeutic Interventions/Progress Updates:    1:1 Pt resting in recliner when arrived.  Pt reports he has been putting heat on his knee.  Educated on why ice is a better option for his knee than heat.  TEDS donned at knee high and knee brace doff.  Pt with significant pitting edema.  Pt Participated in knee exercises with maxi slides to decr resistance to address flexion, extension and straight knee raises to increase ROM to better be able to perform ADLs. Pt required multiple rest breaks due to pain. Pt educated on proper positioning for knee and elevation. Ice applied to knee with knee at neutral.  Recommended icing for 20 min.  LEft resting in the recliner.   Therapy Documentation Precautions:  Precautions Precautions: Other (comment), Fall Precaution Comments: LVAD Required Braces or Orthoses: Other Brace/Splint Other Brace/Splint: hinged knee brace per pt comfort Restrictions Weight Bearing Restrictions: No Pain: Pain Assessment Pain Scale: 0-10 Pain Score: 6  Pain Type: Acute pain Pain Location: Knee Pain Orientation: Left Pain Descriptors / Indicators: Aching;Throbbing Pain Frequency: Constant Pain Onset: On-going Patients Stated Pain Goal: 4 Pain Intervention(s): Medication (See eMAR);Repositioned;Cold applied  See Function Navigator for Current Functional Status.   Therapy/Group: Individual Therapy  Willeen Cass [facility] 05/07/2018, 10:51 AM

## 2018-05-07 NOTE — Progress Notes (Signed)
ANTICOAGULATION CONSULT NOTE  Pharmacy Consult for warfarin Indication: LVAD   Patient Measurements: Height: 5\' 5"  (165.1 cm) Weight: 164 lb 3.9 oz (74.5 kg) IBW/kg (Calculated) : 61.5 Heparin Dosing Weight: 79.5 kg  Vital Signs: Temp: 98.3 F (36.8 C) (07/04 0504) Temp Source: Oral (07/04 0504) Pulse Rate: 67 (07/04 0504)  Labs: Recent Labs    05/05/18 0644 05/06/18 0544 05/07/18 0655  HGB 9.7* 9.6* 9.0*  HCT 31.8* 32.1* 29.8*  PLT 153 166 165  LABPROT 22.5* 22.5* 21.5*  INR 1.99 2.00 1.88  CREATININE 3.29* 4.43* 3.08*    Medical History: Past Medical History:  Diagnosis Date  . AICD (automatic cardioverter/defibrillator) present   . Asthma   . AVM (arteriovenous malformation) of colon 07/07/2017  . CHF (congestive heart failure) ([place])   . Diabetes ([place])   . ESRF (end stage renal failure) ([place]) 07/07/2017   pt receiving hemodialysis Monday- Wednesday-friday, sometimes Saturday  . Kidney disease   . LVAD (left ventricular assist device) present ([place]) 12/2016  . Permanent atrial fibrillation ([place]) 07/07/2017  . Presence of permanent cardiac pacemaker    AICD  . Sleep apnea     Assessment: 1 yom who underwent HM2 LVAD implantation on 12/14/2015. Has undergoing extensive GI workup to evaluate bleeding. Was stopped on anticoagulants as of admission on 03/09/2018. Last outpatient regimen was (as of 02/17/2018): 7.5 mg daily except 10 mg on Tues/Thurs for goal 2-2.5. Goal INR was decreased to 1.8-2.3 after admission on 02/28/2018. Now s/p laparoscopic surgical enteroscopy with 4 clips on 04/10/2018.  Heparin and warfarin were resumed on 6/27 >> now off heparin infusion. Patient had only received two doses of warfarin prior to INR unexpectedly jumping (peaked at 4.71)- now INR at 1.88, within goal range. Dosing lower than previous PTA regimen. Hgb 9, plt 166, LDH stable at 241. No s/sx of bleeding.   Goal of Therapy:  INR goal: 1.8-2.2 Monitor platelets by anticoagulation  protocol: Yes   Plan:  -Warfarin 3mg  PO x 1 tonight -Daily INR/CBC  -Monitor s/sx bleeding  Jalene Mullet, Pharm.D. PGY1 Pharmacy Resident 05/07/2018 9:08 AM Please check AMION for all [place] numbers

## 2018-05-07 NOTE — Progress Notes (Signed)
[place] PHYSICAL MEDICINE & REHABILITATION     PROGRESS NOTE  Subjective/Complaints:  Patient seen lying in bed this morning. He states he slept well overnight. He states he did good first in therapies yesterday.  ROS: Denies CP, SOB, nausea, vomiting, diarrhea.  Objective: Vital Signs: Blood pressure 98/65, pulse 67, temperature 98.3 F (36.8 C), temperature source Oral, resp. rate 17, height 5\' 5"  (1.651 m), weight 74.5 kg (164 lb 3.9 oz), SpO2 97 %. No results found. Recent Labs    05/06/18 0544 05/07/18 0655  WBC 11.9* 10.9*  HGB 9.6* 9.0*  HCT 32.1* 29.8*  PLT 166 165   Recent Labs    05/06/18 0544 05/07/18 0655  NA 132* 135  K 4.1 3.7  CL 96* 97*  GLUCOSE 167* 176*  BUN 15 9  CREATININE 4.43* 3.08*  CALCIUM 7.4* 7.7*   CBG (last 3)  Recent Labs    05/06/18 1148 05/06/18 2155 05/07/18 0643  GLUCAP 96 184* 158*    Wt Readings from Last 3 Encounters:  05/07/18 74.5 kg (164 lb 3.9 oz)  05/05/18 74.2 kg (163 lb 9.3 oz)  04/01/18 90.6 kg (199 lb 11.8 oz)    Physical Exam:  BP 98/65 (BP Location: Right Arm)   Pulse 67   Temp 98.3 F (36.8 C) (Oral)   Resp 17   Ht 5\' 5"  (1.651 m)   Wt 74.5 kg (164 lb 3.9 oz)   SpO2 97%   BMI 27.33 kg/m  Constitutional: He appears well-developed and well-nourished.  HENT: Normocephalic and atraumatic.  Eyes: EOM are normal. No discharge.  Cardiovascular: +Hum. Irregularly irregular Respiratory: Effort normal. Clear. GI: Soft. Bowel sounds are normal.  Musculoskeletal: B/l LE edema L>R. Mild TTP left knee Neurological: He is alert.  Motor: B/l UE: 4+/5 proximal to distal LLE: 4/5 proximal to distal RLE: HF 2+/5, knee limited by pain, ADF 4/5 (stable) Skin:  Left knee with erythema, improving.  No drainage.  BLE with dry flaky skin.  Vascular changes b/l LE  Psychiatric: He has a normal mood and affect. His behavior is normal.   Assessment/Plan: 1. Functional deficits secondary to debility which require 3+  hours per day of interdisciplinary therapy in a comprehensive inpatient rehab setting. Physiatrist is providing close team supervision and 24 hour management of active medical problems listed below. Physiatrist and rehab team continue to assess barriers to discharge/monitor patient progress toward functional and medical goals.  Function:  Bathing Bathing position Bathing activity did not occur: Refused    Bathing parts      Bathing assist        Upper Body Dressing/Undressing Upper body dressing   What is the patient wearing?: Hospital gown                Upper body assist Assist Level: Set up   Set up : To obtain clothing/put away  Lower Body Dressing/Undressing Lower body dressing Lower body dressing/undressing activity did not occur: Refused                                Lower body assist        Toileting Toileting          Toileting assist     Transfers Chair/bed transfer   Chair/bed transfer method: Stand pivot Chair/bed transfer assist level: Touching or steadying assistance (Pt > 75%) Chair/bed transfer assistive device: Walker, Development worker, community  Ambulation     Max distance: 70' Assist level: Touching or steadying assistance (Pt > 75%)   Wheelchair   Type: Manual Max wheelchair distance: 150' Assist Level: Supervision or verbal cues  Cognition Comprehension Comprehension assist level: Follows complex conversation/direction with extra time/assistive device  Expression Expression assist level: Expresses complex ideas: With extra time/assistive device  Social Interaction Social Interaction assist level: Interacts appropriately with others with medication or extra time (anti-anxiety, antidepressant).  Problem Solving Problem solving assist level: Solves complex problems: With extra time  Memory Memory assist level: Assistive device: No helper    Medical Problem List and Plan: 1.  Deficits with mobility, self-care secondary to  LVAD as well as left knee infection/aspiration.  Continue CIR 2.  DVT Prophylaxis/Anticoagulation: Pharmaceutical: Coumadin. Being followed closely by pharmacy.   INR subtherapeutic on 7/4 3. Pain Management: continue oxycodone and/or ultram for pain. Monitor for signs of overdose as HD dependent.  4. Mood: team to provide ego support. LCSW to follow for evaluation and support.  5. Neuropsych: This patient appears capable of making decisions on his own behalf. 6. Skin/Wound Care: routine pressure relief measures.  7. Fluids/Electrolytes/Nutrition: Renal diet --decrease fluid restriction to 1200.Marland Kitchen  Monitor weights daily. Strict I/O.  8. GIB due to AVMs with ABLA: On Protonix BID. Continue to monitor stools for melena/signs of bleeding. Augment bowel program as needed  Hemoglobin 9.0 on 7/4  Continue to monitor 9. CAD s/p CABG/ICM--s/p LVAD: Strict I/O with daily weights. LVAD self test daily. Monitor LVAD per protocol Filed Weights   05/06/18 0507 05/06/18 1915 05/07/18 0504  Weight: 79.2 kg (174 lb 9.7 oz) 77.2 kg (170 lb 3.1 oz) 74.5 kg (164 lb 3.9 oz)   10 T2DM: Not on lantus at this time. Continue 3 units with meals and moderate SSI.   Slightly labile on 7/4, will monitor for trend 11. Septic Knee: Has completed antibiotic course on 05/04/18 12. ESRD: HD MWFSa to help with fluid overload/anasarca.  13. Sleep disturbance: Scheduled trazodone as at home.   14. Anxiety disorder: continue Buspar tid.  15. Leukocytosis  WBCs 10.9 on 7/4  Continue to monitor 16. Hypotension  Asymptomatic at present  LOS (Days) 2 A FACE TO FACE EVALUATION WAS PERFORMED  Marshaun Lortie Lorie Phenix 05/07/2018 9:05 AM

## 2018-05-08 ENCOUNTER — Inpatient Hospital Stay (HOSPITAL_COMMUNITY): Payer: Medicare Other | Admitting: Physical Therapy

## 2018-05-08 ENCOUNTER — Inpatient Hospital Stay (HOSPITAL_COMMUNITY): Payer: Medicare Other

## 2018-05-08 DIAGNOSIS — I25810 Atherosclerosis of coronary artery bypass graft(s) without angina pectoris: Secondary | ICD-10-CM

## 2018-05-08 LAB — BASIC METABOLIC PANEL
ANION GAP: 8 (ref 5–15)
BUN: 21 mg/dL (ref 8–23)
CALCIUM: 7.7 mg/dL — AB (ref 8.9–10.3)
CO2: 30 mmol/L (ref 22–32)
CREATININE: 4.42 mg/dL — AB (ref 0.61–1.24)
Chloride: 95 mmol/L — ABNORMAL LOW (ref 98–111)
GFR calc Af Amer: 14 mL/min — ABNORMAL LOW (ref >60)
GFR calc non Af Amer: 12 mL/min — ABNORMAL LOW (ref >60)
Glucose, Bld: 201 mg/dL — ABNORMAL HIGH (ref 70–99)
Potassium: 3.7 mmol/L (ref 3.5–5.1)
Sodium: 133 mmol/L — ABNORMAL LOW (ref 135–145)

## 2018-05-08 LAB — CBC
HCT: 29.2 % — ABNORMAL LOW (ref 39.0–52.0)
HEMOGLOBIN: 8.8 g/dL — AB (ref 13.0–17.0)
MCH: 29.1 pg (ref 26.0–34.0)
MCHC: 30.1 g/dL (ref 30.0–36.0)
MCV: 96.7 fL (ref 78.0–100.0)
PLATELETS: 170 10*3/uL (ref 150–400)
RBC: 3.02 MIL/uL — ABNORMAL LOW (ref 4.22–5.81)
RDW: 19.4 % — AB (ref 11.5–15.5)
WBC: 11.4 10*3/uL — ABNORMAL HIGH (ref 4.0–10.5)

## 2018-05-08 LAB — GLUCOSE, CAPILLARY
Glucose-Capillary: 205 mg/dL — ABNORMAL HIGH (ref 70–99)
Glucose-Capillary: 196 mg/dL — ABNORMAL HIGH (ref 70–99)
Glucose-Capillary: 244 mg/dL — ABNORMAL HIGH (ref 70–99)

## 2018-05-08 LAB — LACTATE DEHYDROGENASE: LDH: 212 U/L — AB (ref 98–192)

## 2018-05-08 LAB — PROTIME-INR
INR: 1.97
PROTHROMBIN TIME: 22.2 s — AB (ref 11.4–15.2)

## 2018-05-08 MED ORDER — WARFARIN SODIUM 3 MG PO TABS
3.0000 mg | ORAL_TABLET | Freq: Once | ORAL | Status: AC
  Administered 2018-05-08: 3 mg via ORAL
  Filled 2018-05-08: qty 1

## 2018-05-08 NOTE — Progress Notes (Signed)
[place] PHYSICAL MEDICINE & REHABILITATION     PROGRESS NOTE  Subjective/Complaints:  Pt seen lying in bed this AM.  He states he slept well overnight. He notes improvement in left knee pain.  ROS: Denies CP, SOB, nausea, vomiting, diarrhea.  Objective: Vital Signs: Blood pressure (!) 87/64, pulse 77, temperature 98.4 F (36.9 C), temperature source Oral, resp. rate 18, height 5\' 5"  (1.651 m), weight 75.1 kg (165 lb 9.1 oz), SpO2 96 %. No results found. Recent Labs    05/07/18 0655 05/08/18 0650  WBC 10.9* 11.4*  HGB 9.0* 8.8*  HCT 29.8* 29.2*  PLT 165 170   Recent Labs    05/07/18 0655 05/08/18 0650  NA 135 133*  K 3.7 3.7  CL 97* 95*  GLUCOSE 176* 201*  BUN 9 21  CREATININE 3.08* 4.42*  CALCIUM 7.7* 7.7*   CBG (last 3)  Recent Labs    05/07/18 1646 05/07/18 2102 05/08/18 0634  GLUCAP 145* 93 205*    Wt Readings from Last 3 Encounters:  05/08/18 75.1 kg (165 lb 9.1 oz)  05/05/18 74.2 kg (163 lb 9.3 oz)  04/01/18 90.6 kg (199 lb 11.8 oz)    Physical Exam:  BP (!) 87/64   Pulse 77   Temp 98.4 F (36.9 C) (Oral)   Resp 18   Ht 5\' 5"  (1.651 m)   Wt 75.1 kg (165 lb 9.1 oz)   SpO2 96%   BMI 27.55 kg/m  Constitutional: He appears well-developed and well-nourished.  HENT: Normocephalic and atraumatic.  Eyes: EOM are normal. No discharge.  Cardiovascular: +Hum. Irregularly irregular Respiratory: Effort normal.  clear. GI: Soft. Bowel sounds are normal.  Musculoskeletal: B/l LE edema L>R. Mild TTP left knee Neurological: He is alert.  Motor: B/l UE: 4+/5 proximal to distal LLE: 4/5 proximal to distal RLE: HF 2+/5, knee limited by pain, ADF 4/5 (unchanged) Skin:  Left knee with erythema, improving.  No drainage.  BLE with dry flaky skin.  Vascular changes b/l LE Psychiatric: He has a normal mood and affect. His behavior is normal.   Assessment/Plan: 1. Functional deficits secondary to debility which require 3+ hours per day of interdisciplinary  therapy in a comprehensive inpatient rehab setting. Physiatrist is providing close team supervision and 24 hour management of active medical problems listed below. Physiatrist and rehab team continue to assess barriers to discharge/monitor patient progress toward functional and medical goals.  Function:  Bathing Bathing position Bathing activity did not occur: Refused    Bathing parts      Bathing assist        Upper Body Dressing/Undressing Upper body dressing   What is the patient wearing?: Pull over shirt/dress                Upper body assist Assist Level: Set up   Set up : To obtain clothing/put away  Lower Body Dressing/Undressing Lower body dressing Lower body dressing/undressing activity did not occur: Refused What is the patient wearing?: Pants                              Lower body assist        Toileting Toileting   Toileting steps completed by patient: Adjust clothing prior to toileting, Adjust clothing after toileting, Performs perineal hygiene Toileting steps completed by helper: Adjust clothing after toileting Toileting Assistive Devices: Grab bar or rail  Toileting assist Assist level: Touching or steadying assistance (Pt.75%)  Transfers Chair/bed transfer   Chair/bed transfer method: Stand pivot Chair/bed transfer assist level: Touching or steadying assistance (Pt > 75%) Chair/bed transfer assistive device: Walker, Air cabin crew     Max distance: 25' Assist level: Touching or steadying assistance (Pt > 75%)   Wheelchair   Type: Manual Max wheelchair distance: 150' Assist Level: Supervision or verbal cues  Cognition Comprehension Comprehension assist level: Follows complex conversation/direction with extra time/assistive device  Expression Expression assist level: Expresses complex ideas: With extra time/assistive device  Social Interaction Social Interaction assist level: Interacts appropriately with  others with medication or extra time (anti-anxiety, antidepressant).  Problem Solving Problem solving assist level: Solves complex problems: With extra time  Memory Memory assist level: Assistive device: No helper    Medical Problem List and Plan: 1.  Deficits with mobility, self-care secondary to LVAD as well as left knee infection/aspiration.  Continue CIR 2.  DVT Prophylaxis/Anticoagulation: Pharmaceutical: Coumadin. Being followed closely by pharmacy.   INR subtherapeutic on 7/5 3. Pain Management: continue oxycodone and/or ultram for pain. Monitor for signs of overdose as HD dependent.  4. Mood: team to provide ego support. LCSW to follow for evaluation and support.  5. Neuropsych: This patient appears capable of making decisions on his own behalf. 6. Skin/Wound Care: routine pressure relief measures.  7. Fluids/Electrolytes/Nutrition: Renal diet --decrease fluid restriction to 1200.Marland Kitchen  Monitor weights daily. Strict I/O.  8. GIB due to AVMs with ABLA: On Protonix BID. Continue to monitor stools for melena/signs of bleeding. Augment bowel program as needed  Hemoglobin 8.8 on 7/5  Continue to monitor 9. CAD s/p CABG/ICM--s/p LVAD: Strict I/O with daily weights. LVAD self test daily. Monitor LVAD per protocol Filed Weights   05/06/18 1915 05/07/18 0504 05/08/18 0531  Weight: 77.2 kg (170 lb 3.1 oz) 74.5 kg (164 lb 3.9 oz) 75.1 kg (165 lb 9.1 oz)   Stable on 7/5 10 T2DM: Not on lantus at this time. Continue 3 units with meals and moderate SSI.   Labile on 7/5, will monitor for trend 11. Septic Knee: Has completed antibiotic course on 05/04/18 12. ESRD: HD MWFSa to help with fluid overload/anasarca.  13. Sleep disturbance: Scheduled trazodone as at home.   14. Anxiety disorder: continue Buspar tid.  15. Leukocytosis  WBCs 11.4 on 7/5  Afebrile  Continue to monitor 16. Hypotension  Asymptomatic at present  LOS (Days) 3 A FACE TO FACE EVALUATION WAS PERFORMED  Tania Perrott Lorie Phenix 05/08/2018 11:19 AM

## 2018-05-08 NOTE — Progress Notes (Signed)
LVAD Coordinator Rounding Note:  HM II LVAD implanted on 12/13/16 by Dr. Darcey Nora under Destination Therapy criteria due to age excluding heart transplantation.  Transferred from [facility] on 04/30/18 following laparoscopic surgical enteroscopy on 6/7 and received 4 clips. He received 13 uPRBCs but had no further bleeding post-op. Last octreotide injection 6/13. Danazol was DC'd. ASA and coumadin were not resumed. VAD parameters were stable.   Course complicated by AMS and concern for ischemic bowel. Improved with NGT decompression and bowel rest. Head CT negative. Blood cultures and UA were negative. Tolerated clears and thought stable for diet advancement.    Additionally, required multiple left knee aspirations and OR washouts. Aspirate cultures positive for staph epi and clostridium perfingen. Started on Vanc and narrowed to Ancef. Sutures reassessed by ortho on 05/01/18 with planned suture removal 05/04/18; sutures still intact.  Pt doing well. States that he is mainly limited by knee pain for his mobility.  Vital signs: Temp:  98.3 HR: 77 Automatic BP:  87/64(73) Doppler: 90 O2 Sat: 98% RA Wt:174>166 lbs  LVAD interrogation reveals:  Speed:  9200 Flow:  5.0 Power: 5.4 w PI: 5.2 Alarms: none Events:  Fixed speed: 9200 Low speed limit: 8600   Drive Line:  Right abdominal dressing dry and intact; anchor intact. Weekly dressing changes per VAD coordinator. Next dressing change due 05/12/18.    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 Margins not clear at neck. - 02/23/18> hospitalization for GI bleed. Colon/EGD 02/27/18 with normal EGD. Colonoscopy identified active bleeding in ileum; source not found. Capsule Endoscopy 02/27/18: Showed active bleeding in the ileum. Received 5 units blood with histamine reaction; will give benadryl as OP. - 03/10/18>transferrred to [facility] for double retro balloon scop and sent to IR emolization x 3 with no evidence of bleeding. Received 12 units PCs, remained off anticoagulants. Transferred back to [facility] 03/20/18 Seen by IR 5/20. CTA obtained, but no active bleeding found. Started on octreotide. 03/25/18: Repeat capsule endoscopy completed. Bleeding appears to come from the mid-ileum. 03/26/18: General surgery reconsulted. Recommended transfer back to [facility] for surgery with intraoperative endoscopy. [name] currently discussing options. Ortho consulted 5/24 for left knee pain. S/p knee aspiration and steroid injection.  Dose of premarin given 5/24.  On 5/25 developed abdominal bloating and distension with n/v. KUB suggestive of ileus versus early SBO. NGT placed for decompression.  Had large bloody BM after this and felt much better, diet now advanced to regular.  - 04/01/18 transferred to [facility] for  laparoscopic surgical enteroscopy for recurrent GI bleed   Plan/Recommendations: 1.  Weekly dressing changes per VAD coordinator. Next dressing change due 05/12/18. 2.  Call VAD pager if any questions re: VAD equipment or drive line site issues.   [name], VAD Coordinator 24/7 VAD pager: [phone]

## 2018-05-08 NOTE — Progress Notes (Signed)
Patient ID: Trevor [name], male   DOB: 12/22/1945, 72 y.o.   MRN: [id number] [facility] Progress Note   Assessment/ Plan:   1.  Lower GI bleeding: Secondary to AVMs/ischemic with resolution after surgical enteroscopy with 4 clips at [facility].  Hemoglobin/hematocrit noted with slight downward drift. 2. Left knee septic arthritis status post surgical washout: Surgical wounds healing well status post removal of sutures.  Currently admitted to CIR for ongoing rehabilitation. 3. End-stage renal disease: Continue current hemodialysis schedule 4 days a week (MWFSa) for volume control with ultrafiltration-on schedule for hemodialysis today . 4. Hypertension/volume: He remains hypervolemic and we will continue efforts at ultrafiltration with hemodialysis that has been limited by his hypotension/LVAD status. 5. Anemia of ESRD: With history of recurrent GI bleed in the setting of end-stage renal disease on hemodialysis-status post PRBC transfusions and now on ESA. 6. Metabolic bone disease: Phosphorus currently controlled off of binders, continue VDRA for PTH control  7. 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 8. T2DM 9. OSA  Subjective:   Denies any acute events overnight.  Denies chest pain or shortness of breath   Objective:   BP (!) 87/64   Pulse 77   Temp 98.4 F (36.9 C) (Oral)   Resp 18   Ht 5\' 5"  (1.651 m)   Wt 75.1 kg (165 lb 9.1 oz)   SpO2 96%   BMI 27.55 kg/m   Physical Exam: Gen: Comfortably sitting in bed CVS: Pulse regular rhythm, normal rate, LVAD hum audible over precordium Resp: Clear to auscultation bilaterally, no rales/rhonchi Abd: Soft, obese, nontender Ext: 2+ pitting edema left leg, 1+ edema right leg.  Left knee surgical scar healing well, left upper arm aVF with audible bruit  Labs: BMET Recent Labs  Lab 05/02/18 1346 05/03/18 0804 05/04/18 0624 05/05/18 0644 05/06/18 0544 05/07/18 0655 05/08/18 0650  NA 135  136 131* 135 132* 135 133*  K 3.2* 4.0 4.3 4.2 4.1 3.7 3.7  CL 98 96* 93* 96* 96* 97* 95*  CO2 27 30 29 31 29 31 30   GLUCOSE 104* 169* 169* 150* 167* 176* 201*  BUN 12 7* 15 7* 15 9 21   CREATININE 4.61* 3.33* 4.57* 3.29* 4.43* 3.08* 4.42*  CALCIUM 7.3* 7.5* 7.4* 7.5* 7.4* 7.7* 7.7*   CBC Recent Labs  Lab 05/05/18 0644 05/06/18 0544 05/07/18 0655 05/08/18 0650  WBC 12.0* 11.9* 10.9* 11.4*  HGB 9.7* 9.6* 9.0* 8.8*  HCT 31.8* 32.1* 29.8* 29.2*  MCV 96.7 96.4 97.1 96.7  PLT 153 166 165 170   Medications:    . atorvastatin  40 mg Oral q1800  . budesonide  0.25 mg Nebulization BID  . busPIRone  5 mg Oral BID  . calcitRIOL  0.5 mcg Oral Q M,W,F  . Chlorhexidine Gluconate Cloth  6 each Topical Q0600  . [START ON 05/11/2018] darbepoetin (ARANESP) injection - DIALYSIS  100 mcg Intravenous Q Mon-HD  . feeding supplement (NEPRO CARB STEADY)  237 mL Oral BID WC  . hydrocerin   Topical BID  . insulin aspart  0-15 Units Subcutaneous TID WC  . insulin aspart  0-5 Units Subcutaneous QHS  . insulin aspart  3 Units Subcutaneous TID WC  . levothyroxine  25 mcg Oral QAC breakfast  . multivitamin  1 tablet Oral QHS  . pantoprazole  40 mg Oral BID  . senna-docusate  1 tablet Oral BID  . sildenafil  20 mg Oral TID  . traZODone  25 mg Oral QHS  . Warfarin - Pharmacist Dosing Inpatient   Does not apply [id number]   Elmarie Shiley, MD 05/08/2018, 9:58 AM

## 2018-05-08 NOTE — Progress Notes (Signed)
Social Work  Social Work Assessment and Plan  Patient Details  Name: Trevor [name] MRN: [id number] Date of Birth: 1946/07/24  Today's Date: 05/08/2018  Problem List:  Patient Active Problem List   Diagnosis Date Noted  . Subtherapeutic international normalized ratio (INR)   . Arterial hypotension   . Leukocytosis   . Acute blood loss anemia   . History of GI bleed   . Physical debility 05/05/2018  . Debility   . Diabetes mellitus type 2 in nonobese (HCC)   . ESRD on dialysis ([place])   . Pyogenic arthritis of left knee joint ([place])   . Sleep disturbance   . Generalized anxiety disorder   . Ileus ([place])   . Arteriovenous malformation   . GI bleed 03/09/2018  . Small bowel bleed not requiring more than 4 units of blood in 24 hours, ICU, or surgery   . Heme positive stool   . Cellulitis 12/03/2017  . 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 Chronic systolic heart failure ([place]) 09/08/2017  . ESRD (end stage renal disease) ([place]) 07/07/2017  . AVM (arteriovenous malformation) of colon 07/07/2017  . Thrombocytopenia ([place]) 07/07/2017  . Permanent atrial fibrillation ([place]) 07/07/2017  . Anemia of chronic disease 07/07/2017  . CHF (congestive heart failure), NYHA class IV ([place]) 06/13/2017  . AVM (arteriovenous malformation) of colon with hemorrhage   . Symptomatic anemia 02/10/2017  . Type 2 diabetes mellitus ([place]) 01/17/2017  . COPD (chronic obstructive pulmonary disease) ([place]) 01/17/2017  . LVAD (left ventricular assist device) present ([place]) 01/09/2017  . Presence of left ventricular assist device (LVAD) ([place])   . Palliative care encounter   . Cardiorenal syndrome with renal failure 11/28/2016  . Cardiomyopathy, ischemic 11/27/2016  . Coronary artery disease 11/27/2016  . Status post aortic valve replacement 11/27/2016  . Asthma, chronic 11/27/2016  . OSA (obstructive sleep apnea) 11/27/2016   Past Medical History:  Past Medical History:  Diagnosis Date  . AICD  (automatic cardioverter/defibrillator) present   . Asthma   . AVM (arteriovenous malformation) of colon 07/07/2017  . CHF (congestive heart failure) ([place])   . Diabetes ([place])   . ESRF (end stage renal failure) ([place]) 07/07/2017   pt receiving hemodialysis Monday- Wednesday-friday, sometimes Saturday  . Kidney disease   . LVAD (left ventricular assist device) present ([place]) 12/2016  . Permanent atrial fibrillation ([place]) 07/07/2017  . Presence of permanent cardiac pacemaker    AICD  . Sleep apnea    Past Surgical History:  Past Surgical History:  Procedure Laterality Date  . AORTIC VALVE REPLACEMENT  2015   [place]  . APPLICATION OF A-CELL OF EXTREMITY Left 11/20/2017   Procedure: APPLICATION OF A-CELL;  Surgeon: [place] Going, DO;  Location: [facility];  Service: Plastics;  Laterality: Left;  . AV FISTULA PLACEMENT Left 06/30/2017   Procedure: CREATION of LEFT ARM Brachiocephalic Fistula;  Surgeon: Conrad Ardsley, MD;  Location: [facility];  Service: Vascular;  Laterality: Left;  . CARDIAC CATHETERIZATION N/A 12/02/2016   Procedure: Right Heart Cath;  Surgeon: Larey Dresser, MD;  Location: [facility];  Service: Cardiovascular;  Laterality: N/A;  . COLONOSCOPY N/A 02/14/2017   Procedure: COLONOSCOPY;  Surgeon: Milus Banister, MD;  Location: [facility];  Service: Endoscopy;  Laterality: N/A;  . COLONOSCOPY WITH PROPOFOL N/A 02/27/2018   Procedure: COLONOSCOPY WITH PROPOFOL;  Surgeon: Jerene Bears, MD;  Location: [facility];  Service: Gastroenterology;  Laterality: N/A;  .  CORONARY ANGIOPLASTY WITH STENT PLACEMENT  2013   in [place]  . CORONARY ARTERY BYPASS GRAFT  2010   in [place]  . DIALYSIS/PERMA CATHETER INSERTION  06/30/2017   Procedure: INSERTION Dialysis Catheter;  Surgeon: Conrad Coeburn, MD;  [facility];  Service: Vascular;;  . ESOPHAGOGASTRODUODENOSCOPY (EGD) WITH PROPOFOL N/A 02/27/2018   Procedure: ESOPHAGOGASTRODUODENOSCOPY (EGD) WITH PROPOFOL;  Surgeon: Jerene Bears,  MD;  Location: [facility] ENDOSCOPY;  Service: Gastroenterology;  Laterality: N/A;  . GIVENS CAPSULE STUDY N/A 02/27/2018   Procedure: GIVENS CAPSULE STUDY;  Surgeon: Jerene Bears, MD;  Location: [facility];  Service: Gastroenterology;  Laterality: N/A;  . GIVENS CAPSULE STUDY N/A 03/25/2018   Procedure: GIVENS CAPSULE STUDY;  Surgeon: Jackquline Denmark, MD;  Location[facility] ENDOSCOPY;  Service: Endoscopy;  Laterality: N/A;  . IABP INSERTION N/A 12/11/2016   Procedure: IABP Insertion;  Surgeon: Larey Dresser, MD;  Location: [place] [facility];  Service: Cardiovascular;  Laterality: N/A;  . INSERTION OF IMPLANTABLE LEFT VENTRICULAR ASSIST DEVICE N/A 12/13/2016   Procedure: INSERTION OF IMPLANTABLE LEFT VENTRICULAR ASSIST DEVICE;  Surgeon: Ivin Poot, MD;  Location: [facility];  Service: Open Heart Surgery;  Laterality: N/A;  HEARTMATE II  NITRIC OXIDE  . IR FLUORO GUIDE CV LINE LEFT  06/17/2017  . IR US GUIDE VASC ACCESS LEFT  06/17/2017  . MASS EXCISION Left 11/20/2017   Procedure: EXCISION OF LEFT NECK AND RIGHT LEG SKIN CANCER WITH A CELL PLACEMENT;  Surgeon: [place] Going, DO;  Location: [facility];  Service: Plastics;  Laterality: Left;  Marland Kitchen MASS EXCISION Left 11/26/2017   Procedure: EXPLORATION AND EXCISION OF LEFT NECK;  Surgeon: [place] Going, DO;  Location: [facility];  Service: Plastics;  Laterality: Left;  . RIGHT HEART CATH N/A 12/11/2016   Procedure: Right Heart Cath;  Surgeon: Larey Dresser, MD;  Location: [place] [facility];  Service: Cardiovascular;  Laterality: N/A;  . RIGHT HEART CATH N/A 03/27/2017   Procedure: Right Heart Cath;  Surgeon: Larey Dresser, MD;  Location: [facility] [facility];  Service: Cardiovascular;  Laterality: N/A;  . RIGHT HEART CATH N/A 06/13/2017   Procedure: RIGHT HEART CATH;  Surgeon: Jolaine Artist, MD;  Location: [place] [facility];  Service: Cardiovascular;  Laterality: N/A;  . TEE WITHOUT CARDIOVERSION N/A 12/13/2016   Procedure: TRANSESOPHAGEAL ECHOCARDIOGRAM  (TEE);  Surgeon: Ivin Poot, MD;  Location: [facility];  Service: Open Heart Surgery;  Laterality: N/A;  . TRICUSPID VALVE REPLACEMENT N/A 12/13/2016   Procedure: TRICUSPID VALVE REPAIR WITH EDWARDS MC 3 TRICUSPID ANNULOPLASTY RING MODEL 4900 SIZE T 28;  Surgeon: Ivin Poot, MD;  Location: [place];  Service: Open Heart Surgery;  Laterality: N/A;   Social History:  reports that he has never smoked. He has never used smokeless tobacco. He reports that he does not drink alcohol or use drugs.  Family / Support Systems Marital Status: Single Patient Roles: Other (Comment)(brother) Other Supports: sister, Biran Mayberry @ [phone] Anticipated Caregiver: sister Ability/Limitations of Caregiver: sister can provde supervision Caregiver Availability: 24/7 Family Dynamics: Pt notes he moved from [place] to live with sister ~ 2 yrs ago when he began having more health problems. Describes sister as very supportive and able to assist.  Social History Preferred language: English Religion: Catholic Cultural Background: NA Read: Yes Write: Yes Employment Status: Retired Freight forwarder Issues: None Guardian/Conservator: None - per MD, pt is capable of making decisions on his own behalf.  Abuse/Neglect Abuse/Neglect Assessment Can Be Completed: Yes Physical Abuse: Denies Verbal Abuse: Denies Sexual Abuse: Denies Exploitation of patient/patient's resources: Denies Self-Neglect: Denies  Emotional Status Pt's affect, behavior adn adjustment status: Patient able to complete assessment without any difficulty.  Can be humorous at times throughout interview as well as grumpy about smaller details of unit i.e. meals and HD schedule. Recent Psychosocial Issues: Patient moved to [place] to live with his sister as his health declined approximately 2 years ago. Pyschiatric History: None Substance Abuse History: None  Patient / Family Perceptions, Expectations & Goals Pt/Family  understanding of illness & functional limitations: Patient and family have a very good understanding of his complicated medical issues.  Good understanding of GI issues that have taken a long time to resolve and now patient with debility as a result Premorbid pt/family roles/activities: Patient was completely independent PTA and even driving himself to HD treatments. Anticipated changes in roles/activities/participation: Minimal change in roles anticipated if patient able to reach targeted independent to supervision goals. Pt/family expectations/goals: "I just want to get out of here as quickly as possible and back to my routine."  [employer]: Other (Comment)(HD at [facility]. location) Premorbid Home Care/DME Agencies: None Transportation available at discharge: Yes Resource referrals recommended: Neuropsychology  Discharge Planning Living Arrangements: Other relatives Support Systems: Other relatives Type of Residence: Private residence Insurance Resources: [employer], Multimedia programmer (specify)(AARP) Financial Resources: Radio broadcast assistant Screen Referred: No Living Expenses: Lives with family Does the patient have any problems obtaining your medications?: No Home Management: pt and sister manage Patient/Family Preliminary Plans: Pt to return home with sister who can provide any needed support. Social Work Anticipated Follow Up Needs: HH/OP Expected length of stay: ELOS 7 to 10 days  Clinical Impression Pleasant, but occasionally grumpy, gentleman here for debility following acute GI issues.  Patient lives with sister locally who can provide support as needed.  Anticipating short length of stay with independent to supervision goals.  Patient denies any emotional distress, however, will monitor.  Social work to follow for support and discharge planning needs.  Thedora Rings 05/08/2018, 4:06 PM

## 2018-05-08 NOTE — Progress Notes (Signed)
ANTICOAGULATION CONSULT NOTE  Pharmacy Consult for warfarin Indication: LVAD   Patient Measurements: Height: 5\' 5"  (165.1 cm) Weight: 165 lb 9.1 oz (75.1 kg) IBW/kg (Calculated) : 61.5 Heparin Dosing Weight: 79.5 kg  Vital Signs: Temp: 98.4 F (36.9 C) (07/05 0531) Temp Source: Oral (07/05 0531) BP: 87/64 (07/05 0616) Pulse Rate: 77 (07/05 0616)  Labs: Recent Labs    05/06/18 0544 05/07/18 0655 05/08/18 0650  HGB 9.6* 9.0* 8.8*  HCT 32.1* 29.8* 29.2*  PLT 166 165 170  LABPROT 22.5* 21.5* 22.2*  INR 2.00 1.88 1.97  CREATININE 4.43* 3.08* 4.42*    Medical History: Past Medical History:  Diagnosis Date  . AICD (automatic cardioverter/defibrillator) present   . Asthma   . AVM (arteriovenous malformation) of colon 07/07/2017  . CHF (congestive heart failure) ([place])   . Diabetes ([place])   . ESRF (end stage renal failure) ([place]) 07/07/2017   pt receiving hemodialysis Monday- Wednesday-friday, sometimes Saturday  . Kidney disease   . LVAD (left ventricular assist device) present ([place]) 12/2016  . Permanent atrial fibrillation ([place]) 07/07/2017  . Presence of permanent cardiac pacemaker    AICD  . Sleep apnea     Assessment: 36 yom who underwent HM2 LVAD implantation on 12/14/2015. Has undergoing extensive GI workup to evaluate bleeding. Was stopped on anticoagulants as of admission on 03/09/2018. Last outpatient regimen was (as of 02/17/2018): 7.5 mg daily except 10 mg on Tues/Thurs for goal 2-2.5. Goal INR was decreased to 1.8-2.3 after admission on 02/28/2018. Now s/p laparoscopic surgical enteroscopy with 4 clips on 04/10/2018.  Heparin and warfarin were resumed on 6/27 >> now off heparin infusion. Patient had only received two doses of warfarin prior to INR unexpectedly jumping (peaked at 4.71)- now INR at 1.97, within goal range. Dosing lower than previous PTA regimen. CBC and LDH stable. No s/sx of bleeding.   Goal of Therapy:  INR goal: 1.8-2.2 Monitor platelets by  anticoagulation protocol: Yes   Plan:  -Warfarin 3mg  PO again  tonight -Daily INR/CBC  -Monitor s/sx bleeding   Marguerite Olea, [facility] Clinical Pharmacist Phone [phone]  05/08/2018 2:07 PM

## 2018-05-08 NOTE — Progress Notes (Signed)
Occupational Therapy Session Note  Patient Details  Name: [name] MRN: [id number] Date of Birth: April 24, 1946  Today's Date: 05/08/2018 OT Individual Time: 1004-1100 OT Individual Time Calculation (min): 56 min    Short Term Goals: Week 1:  OT Short Term Goal 1 (Week 1): STG = LTGs due to ELOS  Skilled Therapeutic Interventions/Progress Updates:    1:1. Pt sidelying in bed upon arrival with 6/10 pain in knee. RN already aware and pt reporting not time for medication. Pt completes stand pivot transfers throughout session with min A overall with RW and VC for hand placement EOB>w/c>recliner>high standard bed>w/c. Pt requires smal boost to stand from low recliner. Educated pt on use of leg lifter to bring LLE into bed. Pt able to manage BLE with use of leg lifter in/out of bed with supervision. Pt requires extended rest breaks d/t decreased endurance in between mobility. Exited session with pt seated in w/c, call light in reach and all need smet  Therapy Documentation Precautions:  Precautions Precautions: Other (comment), Fall Precaution Comments: LVAD Required Braces or Orthoses: Other Brace/Splint Other Brace/Splint: hinged knee brace per pt comfort Restrictions Weight Bearing Restrictions: No General: General PT Missed Treatment Reason: Other (Comment)(pt w/ coughing fit, needed time to clear secretions)  See Function Navigator for Current Functional Status.   Therapy/Group: Individual Therapy  Tonny Branch 05/08/2018, 12:11 PM

## 2018-05-08 NOTE — Progress Notes (Signed)
Physical Therapy Session Note  Patient Details  Name: Trevor [name] MRN: [id number] Date of Birth: [birth year]  Today's Date: 05/08/2018 PT Individual Time: 0800-0915 AND 1100-1145 PT Individual Time Calculation (min): 75 min AND 45 min  Short Term Goals: Week 1:  PT Short Term Goal 1 (Week 1): =LTGs due to ELOS  Skilled Therapeutic Interventions/Progress Updates:   Session 1:  Pt finishing w/ breakfast and agreeable to therapy, pain 6/10 in L knee and he notes improvement from yesterday. Performed toilet transfer to/from bedside commode w/ set-up assist for pericare and LE garment management. Increased time for mobility 2/2 L knee pain. Pt self-propelled w/c to/from therapy gym. Worked on LLE AROM exercises as providing w/ HEP. Instructed on performing seated LAQs, heel slides, heel rocks, and knee marches and supine quad sets, SLRs, abduction slides, and heel slides. Pt required active assisted w/ SLRs and abduction slides, agreeable to only perform w/ therapy for now 2/2 pain. Otherwise, educated on keeping all exercises in pain-tolerable or pain-free ROM to build up tolerance to active movement. Educated on performing all exercises 1 set of 10 per day as this is his limit right now, and build up to 3x/day. Pt performed all exercises correctly and safely, provided w/ handout. Returned to room and ended session in supine, call bell within reach and all needs met.   Session 2:  Pt in w/c and agreeable to therapy, pain continues to be 6/10 in L knee. Pt self-propelled w/c to/from day room. Performed NuStep @ level 1 within pain tolerable range to work on gentle ROM and strengthening. Pt enjoyed this activity. Able to go 7 mins before having trouble continuing because he began coughing frequently. Stated he felt something was in his throat and he was trying to clear secretions. Successfully cleared multiple, but felt there were more present. Educated on cough-assistance techniques w/ improved  success, but still preventing further skilled activity. Returned to room and ended session in w/c, call bell within reach and all needs met. No clinical signs or symptoms of O2 desat, pt talking intermittently throughout coughing, expressing his needs. RN made aware of status. Missed 15 min of skilled PT.   Therapy Documentation Precautions:  Precautions Precautions: Other (comment), Fall Precaution Comments: LVAD Required Braces or Orthoses: Other Brace/Splint Other Brace/Splint: hinged knee brace per pt comfort Restrictions Weight Bearing Restrictions: No  See Function Navigator for Current Functional Status.   Therapy/Group: Individual Therapy  Jamilla Galli K Arnette 05/08/2018, 11:53 AM

## 2018-05-08 NOTE — Progress Notes (Addendum)
Advanced Heart Failure VAD Team Note  Subjective:    Feeling good overall. Working with rehab. Denies further bleeding. No orthopnea or PND.   Currently on HD without issues. Able to walk more today. No fevers or chills.    LVAD Interrogation HM 2: Speed: 9200 Flow: 5.0 PI: 5.6 Power: 6.0. - VAD interrogated personally. Parameters stable.    Objective:    Vital Signs:   Temp:  [97.8 F (36.6 C)-98.4 F (36.9 C)] 98.4 F (36.9 C) (07/05 0531) Pulse Rate:  [63-99] 77 (07/05 0616) Resp:  [18-19] 18 (07/05 0531) BP: (69-98)/(32-85) 87/64 (07/05 0616) SpO2:  [95 %-99 %] 96 % (07/05 0531) Weight:  [165 lb 9.1 oz (75.1 kg)] 165 lb 9.1 oz (75.1 kg) (07/05 0531) Last BM Date: 05/07/18 Mean arterial Pressure 70-80s  Intake/Output:   Intake/Output Summary (Last 24 hours) at 05/08/2018 0905 Last data filed at 05/08/2018 9323 Gross per 24 hour  Intake 720 ml  Output 250 ml  Net 470 ml     Physical Exam    General: Well appearing this am. NAD. On HD in room   HEENT: Normal. anicteric Neck: Supple, JVP 7-8 cm. Carotids OK.  Cardiac:  Mechanical heart sounds with LVAD hum present.  Lungs:  CTAB, normal effort. No wheeze Abdomen:  NT, ND, no HSM. No bruits or masses. +BS  LVAD exit site: Dressing dry and intact. No erythema or drainage. Stabilization device present and accurately applied. Driveline dressing changed daily per sterile technique. Extremities:  Warm and dry. No cyanosis, clubbing, or rash. L knee surgical scar healing.2+ edema LLE, 1+ RLE RUE AVF Neuro:  Alert & oriented x 3. Cranial nerves grossly intact. Moves all 4 extremities w/o difficulty. Affect pleasant     Telemetry   Not connected  EKG    No new tracings.    Labs   Basic Metabolic Panel: Recent Labs  Lab 05/02/18 1346  05/04/18 0624 05/05/18 0644 05/06/18 0544 05/07/18 0655 05/08/18 0650  NA 135   < > 131* 135 132* 135 133*  K 3.2*   < > 4.3 4.2 4.1 3.7 3.7  CL 98   < > 93* 96* 96* 97* 95*    CO2 27   < > 29 31 29 31 30   GLUCOSE 104*   < > 169* 150* 167* 176* 201*  BUN 12   < > 15 7* 15 9 21   CREATININE 4.61*   < > 4.57* 3.29* 4.43* 3.08* 4.42*  CALCIUM 7.3*   < > 7.4* 7.5* 7.4* 7.7* 7.7*  MG 2.4  --   --   --   --   --   --    < > = values in this interval not displayed.    Liver Function Tests: No results for input(s): AST, ALT, ALKPHOS, BILITOT, PROT, ALBUMIN in the last 168 hours. No results for input(s): LIPASE, AMYLASE in the last 168 hours. No results for input(s): AMMONIA in the last 168 hours.  CBC: Recent Labs  Lab 05/04/18 0624 05/05/18 0644 05/06/18 0544 05/07/18 0655 05/08/18 0650  WBC 15.1* 12.0* 11.9* 10.9* 11.4*  HGB 9.3* 9.7* 9.6* 9.0* 8.8*  HCT 31.1* 31.8* 32.1* 29.8* 29.2*  MCV 97.2 96.7 96.4 97.1 96.7  PLT 149* 153 166 165 170    INR: Recent Labs  Lab 05/04/18 0624 05/05/18 0644 05/06/18 0544 05/07/18 0655 05/08/18 0650  INR 2.32 1.99 2.00 1.88 1.97    Other results:  EKG:    Imaging  No results found.   Medications:     Scheduled Medications: . atorvastatin  40 mg Oral q1800  . budesonide  0.25 mg Nebulization BID  . busPIRone  5 mg Oral BID  . calcitRIOL  0.5 mcg Oral Q M,W,F  . Chlorhexidine Gluconate Cloth  6 each Topical Q0600  . [START ON 05/11/2018] darbepoetin (ARANESP) injection - DIALYSIS  100 mcg Intravenous Q Mon-HD  . feeding supplement (NEPRO CARB STEADY)  237 mL Oral BID WC  . hydrocerin   Topical BID  . insulin aspart  0-15 Units Subcutaneous TID WC  . insulin aspart  0-5 Units Subcutaneous QHS  . insulin aspart  3 Units Subcutaneous TID WC  . levothyroxine  25 mcg Oral QAC breakfast  . multivitamin  1 tablet Oral QHS  . pantoprazole  40 mg Oral BID  . senna-docusate  1 tablet Oral BID  . sildenafil  20 mg Oral TID  . traZODone  25 mg Oral QHS  . Warfarin - Pharmacist Dosing Inpatient   Does not apply q1800    Infusions:   PRN Medications: acetaminophen, albuterol, aluminum hydroxide,  benzonatate, bisacodyl, diphenhydrAMINE, guaiFENesin-dextromethorphan, lidocaine (PF), lidocaine-prilocaine, ondansetron (ZOFRAN) IV, oxyCODONE, polyethylene glycol, traMADol   Patient Profile   Mccrae Speciale Murphyis a 72 y.o.malewith a history of CAD s/p CABG x 4 2010, OSA, AS with TAVR 2015, ,ESRD on HD,DM2,chronicatrial fibrillation, asthma,GI bleed,and ischemic cardiomyopathy with Medtronic ICD.S/PHMII LVAD for Destination therapy on 12/14/2015.  Transferred back from [place] on 6/27 after undergoing enterotomy and clipping of a colonic Dueilafoy lesion as well I&D of septic left knee.  Assessment/Plan:    1. GI Bleed/Symptomatic Anemia:GI AVMs. Double balloon retro endoscopy at [facility], unable to identify bleeding lesion. He failed attempted IR embolization x 3 while at [facility]. 12 units PRBCs at [facility]. Received 15 uPRBC while at [facility]. IR consulted, but did not find any active bleeding on CTA 5/20. Repeat capsule study showed active bleeding in the mid ileum.Surgery was reconsulted and recommended transfer back to [facility] for possible surgery. - Now s/p laparoscopic surgical enteroscopy with 4 clips on 6/7 at [facility] - C/b ischemic bowel/SBO. Now tolerating full diet if he eats in small quantities. Had some nausea yesterday after eating. No BM yesterday.  - Off heparin. INR 1.97. No bleeding but Hgb trending down gently.  - Hgb 9.6-> 9.0 -> 8.8 - Getting Aranesp  2. 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 S/pHM II LVAD placed 2013for DT.Has Medtronic ICD. -Volume status per HD. MWF/Sat schedule - VAD interrogated personally. Parameters stable.   - No bb with RV failure.  -Continue sildenafil 20 mg tid.  3. VAD, HM-II - VAD interrogated personally. Parameters stable.   - LDH 212 - INR Goal 1.8-2.2. INR 1.97. Discussed dosing with PharmD personally.  - No ASA with GIB  4. ESRD- M/W/F - HD per Renal. Continue MWF/Sa schedule  5. Chronic A fib: -Rate  controlled.No change.   6. HTN: -MAPs stable.   7.Septicleftknee - s/pseveral aspirations and OR wash out x2. Aspirate cultures positive for staph epi and clostridium perfinge. Blood cultures negative at [facility]. - Finished Ancef  7/2 - Ortho saw 6/28  Stitches removed 05/05/18 - WBC trending down. Afebrile.   8. DM II -SSI. No change.  9.Hx ofCAD s/pCABG. No s/s of ischemia.    - Continue statin  9. Hx ofTAVR: -Valvestable on last echo2/2019. No change.   10. Deconditioning - Appreciate CIR care.  - No change to current plan.  11. Hypomagnesemia - Resolved. Recheck today.   12. Insomnia - Improved with limiting interruptions in night - Trazadone has for him worked in the past. Continue PRN.  - No change to current plan.    I reviewed the LVAD parameters from today, and compared the results to the patient's prior recorded data.  No programming changes were made.  The LVAD is functioning within specified parameters.  The patient performs LVAD self-test daily.  LVAD interrogation was negative for any significant power changes, alarms or PI events/speed drops.  LVAD equipment check completed and is in good working order.  Back-up equipment present.   LVAD education done on emergency procedures and precautions and reviewed exit site care.   Length of Stay: 3  Annamaria Helling 05/08/2018, 9:05 AM  VAD Team --- VAD ISSUES ONLY--- Pager [phone] (7am - 7am)  Advanced Heart Failure Team  Pager [phone] (M-F; 7a - 4p)  Please contact [facility] for night-coverage after hours (4p -7a ) and weekends on [email or web address]   Patient seen and examined with the above-signed Advanced Practice Provider and/or Housestaff. I personally reviewed laboratory data, imaging studies and relevant notes. I independently examined the patient and formulated the important aspects of the plan. I have edited the note to reflect any of my changes or salient points. I have  personally discussed the plan with the patient and/or family.  Progressing well with CIR. On HD now and volume status improving. Hgb stable. INR 1.97 Discussed dosing with PharmD personally. MAPs ok. VAD interrogated personally. Parameters stable. Continue current plan.   Glori Bickers, MD  4:51 PM

## 2018-05-09 ENCOUNTER — Inpatient Hospital Stay (HOSPITAL_COMMUNITY): Payer: Medicare Other | Admitting: Occupational Therapy

## 2018-05-09 ENCOUNTER — Inpatient Hospital Stay (HOSPITAL_COMMUNITY): Payer: Medicare Other | Admitting: Physical Therapy

## 2018-05-09 LAB — CBC
HCT: 32.6 % — ABNORMAL LOW (ref 39.0–52.0)
Hemoglobin: 9.6 g/dL — ABNORMAL LOW (ref 13.0–17.0)
MCH: 28.7 pg (ref 26.0–34.0)
MCHC: 29.4 g/dL — ABNORMAL LOW (ref 30.0–36.0)
MCV: 97.6 fL (ref 78.0–100.0)
PLATELETS: 199 10*3/uL (ref 150–400)
RBC: 3.34 MIL/uL — ABNORMAL LOW (ref 4.22–5.81)
RDW: 19.9 % — AB (ref 11.5–15.5)
WBC: 9.7 10*3/uL (ref 4.0–10.5)

## 2018-05-09 LAB — BASIC METABOLIC PANEL
ANION GAP: 8 (ref 5–15)
BUN: 12 mg/dL (ref 8–23)
CO2: 31 mmol/L (ref 22–32)
CREATININE: 3.61 mg/dL — AB (ref 0.61–1.24)
Calcium: 8 mg/dL — ABNORMAL LOW (ref 8.9–10.3)
Chloride: 97 mmol/L — ABNORMAL LOW (ref 98–111)
GFR calc non Af Amer: 15 mL/min — ABNORMAL LOW (ref >60)
GFR, EST AFRICAN AMERICAN: 18 mL/min — AB (ref >60)
GLUCOSE: 160 mg/dL — AB (ref 70–99)
Potassium: 3.6 mmol/L (ref 3.5–5.1)
Sodium: 136 mmol/L (ref 135–145)

## 2018-05-09 LAB — GLUCOSE, CAPILLARY
GLUCOSE-CAPILLARY: 103 mg/dL — AB (ref 70–99)
GLUCOSE-CAPILLARY: 230 mg/dL — AB (ref 70–99)
Glucose-Capillary: 154 mg/dL — ABNORMAL HIGH (ref 70–99)
Glucose-Capillary: 146 mg/dL — ABNORMAL HIGH (ref 70–99)

## 2018-05-09 LAB — LACTATE DEHYDROGENASE: LDH: 229 U/L — ABNORMAL HIGH (ref 98–192)

## 2018-05-09 LAB — PHOSPHORUS: Phosphorus: 2.6 mg/dL (ref 2.5–4.6)

## 2018-05-09 LAB — PROTIME-INR
INR: 1.95
Prothrombin Time: 22.1 seconds — ABNORMAL HIGH (ref 11.4–15.2)

## 2018-05-09 MED ORDER — HEPARIN SODIUM (PORCINE) 1000 UNIT/ML DIALYSIS: 40.0000 [IU]/kg | INTRAMUSCULAR | Status: DC | PRN

## 2018-05-09 MED ORDER — WARFARIN SODIUM 3 MG PO TABS
3.0000 mg | ORAL_TABLET | Freq: Once | ORAL | Status: AC
  Administered 2018-05-09: 3 mg via ORAL
  Filled 2018-05-09: qty 1

## 2018-05-09 MED ORDER — HEPARIN SODIUM (PORCINE) 1000 UNIT/ML DIALYSIS
40.0000 [IU]/kg | INTRAMUSCULAR | Status: DC | PRN
  Filled 2018-05-09: qty 3

## 2018-05-09 NOTE — Progress Notes (Signed)
ANTICOAGULATION CONSULT NOTE  Pharmacy Consult for warfarin Indication: LVAD   Patient Measurements: Height: 5\' 5"  (165.1 cm) Weight: 164 lb 10.9 oz (74.7 kg) IBW/kg (Calculated) : 61.5 Heparin Dosing Weight: 79.5 kg  Vital Signs: Temp: 97.8 F (36.6 C) (07/06 0512) Temp Source: Oral (07/06 0512) BP: 76/61 (07/06 0436) Pulse Rate: 70 (07/06 0512)  Labs: Recent Labs    05/07/18 0655 05/08/18 0650  HGB 9.0* 8.8*  HCT 29.8* 29.2*  PLT 165 170  LABPROT 21.5* 22.2*  INR 1.88 1.97  CREATININE 3.08* 4.42*    Medical History: Past Medical History:  Diagnosis Date  . AICD (automatic cardioverter/defibrillator) present   . Asthma   . AVM (arteriovenous malformation) of colon 07/07/2017  . CHF (congestive heart failure) ([place])   . Diabetes ([place])   . ESRF (end stage renal failure) ([place]) 07/07/2017   pt receiving hemodialysis Monday- Wednesday-friday, sometimes Saturday  . Kidney disease   . LVAD (left ventricular assist device) present ([place]) 12/2016  . Permanent atrial fibrillation ([place]) 07/07/2017  . Presence of permanent cardiac pacemaker    AICD  . Sleep apnea     Assessment: 46 yom who underwent HM2 LVAD implantation on 12/14/2015. Has undergoing extensive GI workup to evaluate bleeding. Was stopped on anticoagulants as of admission on 03/09/2018. Last outpatient regimen was (as of 02/17/2018): 7.5 mg daily except 10 mg on Tues/Thurs for goal 2-2.5. Goal INR was decreased to 1.8-2.3 after admission on 02/28/2018. Now s/p laparoscopic surgical enteroscopy with 4 clips on 04/10/2018.  Heparin and warfarin were resumed on 6/27 >> now off heparin infusion. Patient had only received two doses of warfarin prior to INR unexpectedly jumping (peaked at 4.71)- now INR 1.9 this am, within goal range. Dosing lower than previous PTA regimen. CBC and LDH stable. No s/sx of bleeding.   Goal of Therapy:  INR goal: 1.8-2.2 Monitor platelets by anticoagulation protocol: Yes   Plan:  -Warfarin 3mg   PO again tonight -Daily INR/CBC  -Monitor s/sx bleeding  Erin Hearing PharmD., BCPS Clinical Pharmacist 05/09/2018 7:34 AM

## 2018-05-09 NOTE — Progress Notes (Signed)
Occupational Therapy Session Note  Patient Details  Name: MENACHEM URBANEK MRN: [id number] Date of Birth: 10-Jan-1946  Today's Date: 05/09/2018 OT Individual Time: 9562-1308 OT Individual Time Calculation (min): 30 min   Short Term Goals: Week 1:  OT Short Term Goal 1 (Week 1): STG = LTGs due to ELOS  Skilled Therapeutic Interventions/Progress Updates:    Pt greeted on [facility] via OT handoff. RN present for administering medication. After large BM, pt stood with Min A from [facility] and required assist for perihygiene thoroughness. He donned clean hospital gown, per request, and refused to don Teds for edema mgt. "They're too small- I'll only wear extra large regular." Searched for these Teds in clean utility but none were present. He refused to engage in additional self care or therapeutic activities. Wanting to rest before dialysis. Pt changed himself from battery>wall power and completed stand pivot<bed with steady assist. Pt left with RN at session exit. 30 minutes missed due to pt refusal.   Therapy Documentation Precautions:  Precautions Precautions: Other (comment), Fall Precaution Comments: LVAD Required Braces or Orthoses: Other Brace/Splint Other Brace/Splint: hinged knee brace per pt comfort Restrictions Weight Bearing Restrictions: No Vital Signs: Therapy Vitals BP: (!) 64/47 Oxygen Therapy SpO2: 100 % O2 Device: Room Air ADL:      See Function Navigator for Current Functional Status.   Therapy/Group: Individual Therapy  Raeshawn Vo A Tayva Easterday 05/09/2018, 10:42 AM

## 2018-05-09 NOTE — Progress Notes (Signed)
Patient ID: Trevor [name], male   DOB: [birth year], 72 y.o.   MRN: [id number] [facility] Progress Note   Assessment/ Plan:   1.  Lower GI bleeding: Secondary to AVMs/ischemic with resolution after surgical enteroscopy with 4 clips at [facility].  Hemoglobin/hematocrit noted to now be improving. 2. Left knee septic arthritis status post surgical washout: Surgical wounds healing well status post removal of sutures.  Making progress with physiatry-still unable to bear weight on his left leg due to pain. 3. End-stage renal disease: Continue current hemodialysis schedule 4 days a week (MWFSa) for volume control with ultrafiltration-on schedule for hemodialysis again today . 4. Hypertension/volume: He remains hypervolemic and we will continue efforts at ultrafiltration with hemodialysis that has been limited by his hypotension/LVAD status. 5. Anemia of ESRD: With history of recurrent GI bleed in the setting of end-stage renal disease on hemodialysis-status post PRBC transfusions and now on ESA. 6. Metabolic bone disease: Not on phosphorus binder-we will check phosphorus level today (add onto previous lab), continue VDRA for PTH control  7. 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 8. T2DM 9. OSA  Subjective:   Denies any acute events overnight-tolerated hemodialysis with ultrafiltration of 3 L yesterday.  Expressing some progress and motivation from physical therapy.   Objective:   BP (!) 76/61   Pulse 70   Temp 97.8 F (36.6 C) (Oral)   Resp 17   Ht 5\' 5"  (1.651 m)   Wt 74.7 kg (164 lb 10.9 oz)   SpO2 100%   BMI 27.40 kg/m   Physical Exam: Gen: Comfortably resting propped up in bed CVS: Pulse regular rhythm, normal rate, LVAD hum audible over precordium Resp: Clear to auscultation bilaterally, no rales/rhonchi Abd: Soft, obese, nontender Ext: 2+ pitting edema left leg, 1+ edema right leg.  Left knee surgical scar healing well, left upper arm  aVF with audible bruit  Labs: BMET Recent Labs  Lab 05/03/18 0804 05/04/18 7829 05/05/18 0644 05/06/18 0544 05/07/18 0655 05/08/18 0650 05/09/18 0731  NA 136 131* 135 132* 135 133* 136  K 4.0 4.3 4.2 4.1 3.7 3.7 3.6  CL 96* 93* 96* 96* 97* 95* 97*  CO2 30 29 31 29 31 30 31   GLUCOSE 169* 169* 150* 167* 176* 201* 160*  BUN 7* 15 7* 15 9 21 12   CREATININE 3.33* 4.57* 3.29* 4.43* 3.08* 4.42* 3.61*  CALCIUM 7.5* 7.4* 7.5* 7.4* 7.7* 7.7* 8.0*   CBC Recent Labs  Lab 05/06/18 0544 05/07/18 0655 05/08/18 0650 05/09/18 0731  WBC 11.9* 10.9* 11.4* 9.7  HGB 9.6* 9.0* 8.8* 9.6*  HCT 32.1* 29.8* 29.2* 32.6*  MCV 96.4 97.1 96.7 97.6  PLT 166 165 170 199   Medications:    . atorvastatin  40 mg Oral q1800  . budesonide  0.25 mg Nebulization BID  . busPIRone  5 mg Oral BID  . calcitRIOL  0.5 mcg Oral Q M,W,F  . Chlorhexidine Gluconate Cloth  6 each Topical Q0600  . [START ON 05/11/2018] darbepoetin (ARANESP) injection - DIALYSIS  100 mcg Intravenous Q Mon-HD  . feeding supplement (NEPRO CARB STEADY)  237 mL Oral BID WC  . hydrocerin   Topical BID  . insulin aspart  0-15 Units Subcutaneous TID WC  . insulin aspart  0-5 Units Subcutaneous QHS  . insulin aspart  3 Units Subcutaneous TID WC  . levothyroxine  25 mcg Oral QAC breakfast  . multivitamin  1 tablet Oral QHS  .  pantoprazole  40 mg Oral BID  . senna-docusate  1 tablet Oral BID  . sildenafil  20 mg Oral TID  . traZODone  25 mg Oral QHS  . Warfarin - Pharmacist Dosing Inpatient   Does not apply T0211   Elmarie Shiley, MD 05/09/2018, 9:10 AM

## 2018-05-09 NOTE — Progress Notes (Signed)
Advanced Heart Failure VAD Team Note  Subjective:    Tolerated HD well yesterday.  Took off over 3L  Weight up 5 pounds (likely inaccurate) Denies SOB, orthopnea or PND. No bleeding.   LVAD Interrogation HM 2: Speed: 9200 Flow: 4.1 PI: 5.0 Power: 6.0. - VAD interrogated personally. Parameters stable.   Objective:    Vital Signs:   Temp:  [97.2 F (36.2 C)-98.3 F (36.8 C)] 97.8 F (36.6 C) (07/06 0512) Pulse Rate:  [59-93] 70 (07/06 0512) Resp:  [16-20] 17 (07/06 0512) BP: (66-164)/(25-84) 76/61 (07/06 0436) SpO2:  [96 %-98 %] 97 % (07/06 0512) Weight:  [72.3 kg (159 lb 6.3 oz)-75.5 kg (166 lb 7.2 oz)] 74.7 kg (164 lb 10.9 oz) (07/06 0515) Last BM Date: 05/07/18 Mean arterial Pressure 66-70  Intake/Output:   Intake/Output Summary (Last 24 hours) at 05/09/2018 0546 Last data filed at 05/08/2018 1900 Gross per 24 hour  Intake 290 ml  Output 3278 ml  Net -2988 ml     Physical Exam    General: Resting comfortably  NAD.  HEENT: normal  Neck: supple. JVP 7-8.  Carotids 2+ bilat; no bruits. No lymphadenopathy or thryomegaly appreciated. Cor: LVAD hum.  Lungs: Clear. Abdomen: obese soft, nontender, non-distended. No hepatosplenomegaly. No bruits or masses. Good bowel sounds. Driveline site clean. Anchor in place.  Extremities: no cyanosis, clubbing, rash. Warm no edema  L knees surgical site healing. 1+ edema trace edema on R Neuro: alert & oriented x 3. No focal deficits. Moves all 4 without problem    Telemetry   Not connected  EKG    No new tracings.    Labs   Basic Metabolic Panel: Recent Labs  Lab 05/02/18 1346  05/04/18 0624 05/05/18 0644 05/06/18 0544 05/07/18 0655 05/08/18 0650  NA 135   < > 131* 135 132* 135 133*  K 3.2*   < > 4.3 4.2 4.1 3.7 3.7  CL 98   < > 93* 96* 96* 97* 95*  CO2 27   < > 29 31 29 31 30   GLUCOSE 104*   < > 169* 150* 167* 176* 201*  BUN 12   < > 15 7* 15 9 21   CREATININE 4.61*   < > 4.57* 3.29* 4.43* 3.08* 4.42*  CALCIUM  7.3*   < > 7.4* 7.5* 7.4* 7.7* 7.7*  MG 2.4  --   --   --   --   --   --    < > = values in this interval not displayed.    Liver Function Tests: No results for input(s): AST, ALT, ALKPHOS, BILITOT, PROT, ALBUMIN in the last 168 hours. No results for input(s): LIPASE, AMYLASE in the last 168 hours. No results for input(s): AMMONIA in the last 168 hours.  CBC: Recent Labs  Lab 05/04/18 0624 05/05/18 0644 05/06/18 0544 05/07/18 0655 05/08/18 0650  WBC 15.1* 12.0* 11.9* 10.9* 11.4*  HGB 9.3* 9.7* 9.6* 9.0* 8.8*  HCT 31.1* 31.8* 32.1* 29.8* 29.2*  MCV 97.2 96.7 96.4 97.1 96.7  PLT 149* 153 166 165 170    INR: Recent Labs  Lab 05/04/18 0624 05/05/18 0644 05/06/18 0544 05/07/18 0655 05/08/18 0650  INR 2.32 1.99 2.00 1.88 1.97    Other results:  EKG:    Imaging   No results found.   Medications:     Scheduled Medications: . atorvastatin  40 mg Oral q1800  . budesonide  0.25 mg Nebulization BID  . busPIRone  5 mg Oral BID  .  calcitRIOL  0.5 mcg Oral Q M,W,F  . Chlorhexidine Gluconate Cloth  6 each Topical Q0600  . [START ON 05/11/2018] darbepoetin (ARANESP) injection - DIALYSIS  100 mcg Intravenous Q Mon-HD  . feeding supplement (NEPRO CARB STEADY)  237 mL Oral BID WC  . hydrocerin   Topical BID  . insulin aspart  0-15 Units Subcutaneous TID WC  . insulin aspart  0-5 Units Subcutaneous QHS  . insulin aspart  3 Units Subcutaneous TID WC  . levothyroxine  25 mcg Oral QAC breakfast  . multivitamin  1 tablet Oral QHS  . pantoprazole  40 mg Oral BID  . senna-docusate  1 tablet Oral BID  . sildenafil  20 mg Oral TID  . traZODone  25 mg Oral QHS  . Warfarin - Pharmacist Dosing Inpatient   Does not apply q1800    Infusions:   PRN Medications: acetaminophen, albuterol, aluminum hydroxide, benzonatate, bisacodyl, diphenhydrAMINE, guaiFENesin-dextromethorphan, lidocaine (PF), lidocaine-prilocaine, ondansetron (ZOFRAN) IV, oxyCODONE, polyethylene glycol,  traMADol   Patient Profile   Trevor Moch Murphyis a 72 y.o.malewith a history of CAD s/p CABG x 4 2010, OSA, AS with TAVR 2015, ,ESRD on HD,DM2,chronicatrial fibrillation, asthma,GI bleed,and ischemic cardiomyopathy with Medtronic ICD.S/PHMII LVAD for Destination therapy on 12/14/2015.  Transferred back from [place] on 6/27 after undergoing enterotomy and clipping of a colonic Dueilafoy lesion as well I&D of septic left knee.  Assessment/Plan:    1. GI Bleed/Symptomatic Anemia:GI AVMs. Double balloon retro endoscopy at [facility], unable to identify bleeding lesion. He failed attempted IR embolization x 3 while at [facility]. 12 units PRBCs at [facility]. Received 15 uPRBC while at [facility]. IR consulted, but did not find any active bleeding on CTA 5/20. Repeat capsule study showed active bleeding in the mid ileum.Surgery was reconsulted and recommended transfer back to [facility] for possible surgery. - Now s/p laparoscopic surgical enteroscopy with 4 clips on 6/7 at [facility] - C/b ischemic bowel/SBO. Now tolerating full diet if he eats in small quantities. Had some nausea yesterday after eating. No BM yesterday.  - Tolerating warfarin. INR 1.97 yesterday.   - Hgb with slow drift down but no obvious bleeding 9.6-> 9.0 -> 8.8 -> pending today - Getting Aranesp  2. 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 S/pHM II LVAD placed 2045for DT.Has Medtronic ICD. -Volume status per HD. MWF/Sat schedule - VAD interrogated personally. Parameters stable. - No bb with RV failure.  -Continue sildenafil 20 mg tid.  3. VAD, HM-II - VAD interrogated personally. Parameters stable. - LDH has been stable  - INR Goal 1.8-2.2. INR 1.97 yesterday. Await todays labs. Discussed dosing with PharmD personally.  - No ASA with GIB  4. ESRD- M/W/F - HD per Renal. Continue MWF/Sa schedule. MAPs low may be nearing dry weight  5. Chronic A fib: -Rate controlled.No change.   6. HTN: -MAPs a bit low  today. May be nearing dry weight. Can add midodrine as needed  7.Septicleftknee - s/pseveral aspirations and OR wash out x2. Aspirate cultures positive for staph epi and clostridium perfinge. Blood cultures negative at [facility]. - Finished Ancef  7/2 - Ortho saw 6/28  Stitches removed 05/05/18 - WBC trending down. Afebrile.   8. DM II -SSI. No change.  9.Hx ofCAD s/pCABG. No s/s of ischemia.    - Continue statin  9. Hx ofTAVR: -Valvestable on last echo2/2019. No change.   10. Deconditioning - Improving. Appreciate CIR care - No change to current plan.    11. Hypomagnesemia - Resolved. Recheck today.  12. Insomnia - Improved with limiting interruptions in night - Trazadone has for him worked in the past. Continue PRN.  - No change to current plan.    I reviewed the LVAD parameters from today, and compared the results to the patient's prior recorded data.  No programming changes were made.  The LVAD is functioning within specified parameters.  The patient performs LVAD self-test daily.  LVAD interrogation was negative for any significant power changes, alarms or PI events/speed drops.  LVAD equipment check completed and is in good working order.  Back-up equipment present.   LVAD education done on emergency procedures and precautions and reviewed exit site care.   Length of Stay: 4  Glori Bickers, MD 05/09/2018, 5:46 AM  VAD Team --- VAD ISSUES ONLY--- Pager [phone] (7am - 7am)  Advanced Heart Failure Team  Pager [phone] (M-F; 7a - 4p)  Please contact [facility] for night-coverage after hours (4p -7a ) and weekends on [email or web address]

## 2018-05-09 NOTE — Progress Notes (Signed)
Physical Therapy Session Note  Patient Details  Name: Trevor [name] MRN: [id number] Date of Birth: [birth year]  Today's Date: 05/09/2018 PT Individual Time: 1105-1205 PT Individual Time Calculation (min): 60 min   Short Term Goals: Week 1:  PT Short Term Goal 1 (Week 1): =LTGs due to ELOS  Skilled Therapeutic Interventions/Progress Updates:   Pt received supine in bed and agreeable to PT. Supine>sit transfer with min assist to control the LLE. Sit<>stand from bed  completed x 2 with supervision assist. Min assist stand pivot transfer to [place]. Blocked practice sit<>stand x 5 with supervision assist and min cues for Ad management. Gait training with supervision assist x73f wth min cues for step to gait pattern to reduce stress in the L knee. WC mobility x [id number] supervision assist from PT and min cues for doorway management. Pt returned to room and performed stand pivot transfer to bed with supervision assist and UE support on bed rail. Pt left sitting EOB with call bell in reach and all needs met to eat lunch.       Therapy Documentation Precautions:  Precautions Precautions: Other (comment), Fall Precaution Comments: LVAD Required Braces or Orthoses: Other Brace/Splint Other Brace/Splint: hinged knee brace per pt comfort Restrictions Weight Bearing Restrictions: No Vital Signs: Therapy Vitals BP: (!) 89/73 Oxygen Therapy SpO2: 100 % O2 Device: Room Air Pain: 8/10 L knee.  See Function Navigator for Current Functional Status.   Therapy/Group: Individual Therapy  AuLorie Phenix/04/2018, 12:37 PM

## 2018-05-09 NOTE — Progress Notes (Signed)
Trevor [name] is a 72 y.o. male Nov 15, 1945 [id number]  Subjective: Complaining of his foot being cold all the time. No new problems. Slept well. Feeling OK.  Objective: Vital signs in last 24 hours: Temp:  [97.2 F (36.2 C)-98.3 F (36.8 C)] 97.8 F (36.6 C) (07/06 0512) Pulse Rate:  [59-93] 70 (07/06 0512) Resp:  [16-20] 17 (07/06 0512) BP: (64-164)/(25-84) 89/73 (07/06 1056) SpO2:  [96 %-100 %] 100 % (07/06 0842) Weight:  [159 lb 6.3 oz (72.3 kg)-166 lb 7.2 oz (75.5 kg)] 164 lb 10.9 oz (74.7 kg) (07/06 0515) Weight change: 14.1 oz (0.4 kg) Last BM Date: 05/07/18  Intake/Output from previous day: 07/05 0701 - 07/06 0700 In: 290 [P.O.:290] Out: 3278  Last cbgs: CBG (last 3)  Recent Labs    05/08/18 1157 05/08/18 2118 05/09/18 0640  GLUCAP 196* 244* 154*     Physical Exam General: No apparent distress.  Eating breakfast HEENT: not dry Lungs: Normal effort. Lungs clear to auscultation, no crackles or wheezes. Cardiovascular: Regular rate and rhythm, no edema Abdomen: S/NT/ND; BS(+) Musculoskeletal:  unchanged Neurological: No new neurological deficits Wounds: N/A    Skin: clear  Aging changes Mental state: Alert, oriented, cooperative    Lab Results: BMET    Component Value Date/Time   NA 136 05/09/2018 0731   K 3.6 05/09/2018 0731   CL 97 (L) 05/09/2018 0731   CO2 31 05/09/2018 0731   GLUCOSE 160 (H) 05/09/2018 0731   BUN 12 05/09/2018 0731   CREATININE 3.61 (H) 05/09/2018 0731   CALCIUM 8.0 (L) 05/09/2018 0731   GFRNONAA 15 (L) 05/09/2018 0731   GFRAA 18 (L) 05/09/2018 0731   CBC    Component Value Date/Time   WBC 9.7 05/09/2018 0731   RBC 3.34 (L) 05/09/2018 0731   HGB 9.6 (L) 05/09/2018 0731   HCT 32.6 (L) 05/09/2018 0731   HCT 26.1 (L) 12/08/2016 0334   PLT 199 05/09/2018 0731   MCV 97.6 05/09/2018 0731   MCH 28.7 05/09/2018 0731   MCHC 29.4 (L) 05/09/2018 0731   RDW 19.9 (H) 05/09/2018 0731   LYMPHSABS 0.6 (L) 04/01/2018 0333   MONOABS 0.3 04/01/2018 0333   EOSABS 0.0 04/01/2018 0333   BASOSABS 0.0 04/01/2018 0333    Studies/Results: No results found.  Medications: I have reviewed the patient's current medications.  Assessment/Plan:   1. Deficits with mobility, self-care secondary to LVAD as well as left knee infection/aspiration. CIR 2.  DVT prophylaxis with Coumadin 3.  Pain management-PRN oxycodone or Ultram 4.  Gastrointestinal bleeding due to AVMs with ABLA.  On Protonix twice daily monitoring CBC 5.  Coronary artery disease status post bypass surgery 6.  Diabetes mellitus type 2.  On moderate sensitivity and sliding scale 7.  Septic knee.  Antibiotics were completed on 05/04/2018 8.  End-stage renal disease on hemodialysis-Monday Wednesday Friday Saturday 9.  Anxiety.  On BuSpar 10.  Insomnia-on trazodone 11.  Hypertension          Length of stay, days: 4  Walker Kehr , MD 05/09/2018, 11:29 AM

## 2018-05-09 NOTE — Progress Notes (Signed)
Occupational Therapy Session Note  Patient Details  Name: Trevor [name] MRN: [id number] Date of Birth: May 28, 1946  Today's Date: 05/09/2018 OT Individual Time: [phone] OT Individual Time Calculation (min): 15 min   Skilled Therapeutic Interventions/Progress Updates: patient schedlued for therapy at 1pm and dialysis called nursing station and indicated he would have in room dialysis today beginning at 44.   Due to schedule conflict he missed 45 minutes of his scheduled session.  Nonetheless his skilled OT participation this session was as follows:  Bed mobility supine with head of bed raised to sitting EOB=extra time and supervision;     EOB to 3:1 placed toward patient's painful left knee = extra time and CGA for 2 steps and then stand pivot. He started to brush teeth and complete BM before next therapist arrived, and at that point he was handed off to her and his RN whom gave in to give meds.     Therapy Documentation Precautions:  Precautions Precautions: Other (comment), Fall Precaution Comments: LVAD Required Braces or Orthoses: Other Brace/Splint Other Brace/Splint: hinged knee brace per pt comfort Restrictions Weight Bearing Restrictions: No General: General OT Amount of Missed Time: 45 Minutes(45) Pain:denied  See Function Navigator for Current Functional Status.   Therapy/Group: Individual Therapy  Alfredia Ferguson [facility] 05/09/2018, 1:50 PM

## 2018-05-10 ENCOUNTER — Inpatient Hospital Stay (HOSPITAL_COMMUNITY): Payer: Medicare Other | Admitting: Physical Therapy

## 2018-05-10 ENCOUNTER — Inpatient Hospital Stay (HOSPITAL_COMMUNITY): Payer: Medicare Other | Admitting: Occupational Therapy

## 2018-05-10 DIAGNOSIS — I953 Hypotension of hemodialysis: Secondary | ICD-10-CM

## 2018-05-10 LAB — PROTIME-INR
INR: 1.57
Prothrombin Time: 18.6 seconds — ABNORMAL HIGH (ref 11.4–15.2)

## 2018-05-10 LAB — CBC
HEMATOCRIT: 32.4 % — AB (ref 39.0–52.0)
Hemoglobin: 9.6 g/dL — ABNORMAL LOW (ref 13.0–17.0)
MCH: 28.6 pg (ref 26.0–34.0)
MCHC: 29.6 g/dL — ABNORMAL LOW (ref 30.0–36.0)
MCV: 96.4 fL (ref 78.0–100.0)
Platelets: 213 10*3/uL (ref 150–400)
RBC: 3.36 MIL/uL — AB (ref 4.22–5.81)
RDW: 19.8 % — ABNORMAL HIGH (ref 11.5–15.5)
WBC: 10.4 10*3/uL (ref 4.0–10.5)

## 2018-05-10 LAB — BASIC METABOLIC PANEL
Anion gap: 8 (ref 5–15)
BUN: 9 mg/dL (ref 8–23)
CO2: 31 mmol/L (ref 22–32)
Calcium: 8.2 mg/dL — ABNORMAL LOW (ref 8.9–10.3)
Chloride: 97 mmol/L — ABNORMAL LOW (ref 98–111)
Creatinine, Ser: 3.2 mg/dL — ABNORMAL HIGH (ref 0.61–1.24)
GFR calc non Af Amer: 18 mL/min — ABNORMAL LOW (ref >60)
GFR, EST AFRICAN AMERICAN: 21 mL/min — AB (ref >60)
Glucose, Bld: 162 mg/dL — ABNORMAL HIGH (ref 70–99)
POTASSIUM: 3.6 mmol/L (ref 3.5–5.1)
SODIUM: 136 mmol/L (ref 135–145)

## 2018-05-10 LAB — LACTATE DEHYDROGENASE: LDH: 227 U/L — ABNORMAL HIGH (ref 98–192)

## 2018-05-10 LAB — GLUCOSE, CAPILLARY
GLUCOSE-CAPILLARY: 78 mg/dL (ref 70–99)
GLUCOSE-CAPILLARY: 206 mg/dL — AB (ref 70–99)
Glucose-Capillary: 152 mg/dL — ABNORMAL HIGH (ref 70–99)
Glucose-Capillary: 131 mg/dL — ABNORMAL HIGH (ref 70–99)

## 2018-05-10 MED ORDER — WARFARIN SODIUM 5 MG PO TABS
5.0000 mg | ORAL_TABLET | Freq: Once | ORAL | Status: AC
  Administered 2018-05-10: 5 mg via ORAL
  Filled 2018-05-10: qty 1

## 2018-05-10 NOTE — Plan of Care (Signed)
  Problem: Consults Goal: RH GENERAL PATIENT EDUCATION Description See Patient Education module for education specifics. Outcome: Progressing Goal: Diabetes Guidelines if Diabetic/Glucose > 140 Description If diabetic or lab glucose is > 140 mg/dl - Initiate Diabetes/Hyperglycemia Guidelines & Document Interventions  Outcome: Progressing   Problem: RH BOWEL ELIMINATION Goal: RH STG MANAGE BOWEL WITH ASSISTANCE Description STG Manage Bowel with min ssistance.  Outcome: Progressing Goal: RH STG MANAGE BOWEL W/MEDICATION W/ASSISTANCE Description STG Manage Bowel with Medication with min Assistance.  Outcome: Progressing Goal: RH STG MANAGE BOWEL W/EQUIPMENT W/ASSISTANCE Description STG Manage Bowel With Equipment With min  Assistance  Outcome: Progressing Goal: RH OTHER STG BOWEL ELIMINATION GOALS W/ASSIST Description Other STG Bowel Elimination Goals With min Assistance.  Outcome: Progressing   Problem: RH BLADDER ELIMINATION Goal: RH STG MANAGE BLADDER WITH ASSISTANCE Description STG Manage Bladder With mod I  Assistance  Outcome: Progressing   Problem: RH SKIN INTEGRITY Goal: RH STG SKIN FREE OF INFECTION/BREAKDOWN Outcome: Progressing Goal: RH STG MAINTAIN SKIN INTEGRITY WITH ASSISTANCE Description STG Maintain Skin Integrity With mod I Assistance.  Outcome: Progressing Goal: RH STG ABLE TO PERFORM INCISION/WOUND CARE W/ASSISTANCE Description STG Able To Perform Incision/Wound Care With mod I Assistance.  Outcome: Progressing Goal: RH OTHER STG SKIN INTEGRITY GOALS W/ASSIST Description Other STG Skin Integrity Goals With mod I  Assistance.  Outcome: Progressing   Problem: RH SAFETY Goal: RH STG ADHERE TO SAFETY PRECAUTIONS W/ASSISTANCE/DEVICE Description STG Adhere to Safety Precautions With mod I Assistance/Device.  Outcome: Progressing Goal: RH STG DECREASED RISK OF FALL WITH ASSISTANCE Description STG Decreased Risk of Fall With mod I Assistance.   Outcome: Progressing   Problem: RH PAIN MANAGEMENT Goal: RH STG PAIN MANAGED AT OR BELOW PT'S PAIN GOAL Description Less than 3 out of 10  Outcome: Progressing Goal: RH OTHER STG PAIN MANAGEMENT GOALS W/ASSIST Description Other STG Pain Management Goals With mod I Assistance.  Outcome: Progressing   Problem: RH KNOWLEDGE DEFICIT GENERAL Goal: RH STG INCREASE KNOWLEDGE OF SELF CARE AFTER HOSPITALIZATION Outcome: Progressing

## 2018-05-10 NOTE — Progress Notes (Signed)
Patient ID: Trevor [name], male   DOB: [birth year], 72 y.o.   MRN: [id number] [facility] Progress Note   Assessment/ Plan:   1.  Lower GI bleeding: Secondary to AVMs/ischemic with resolution after surgical enteroscopy with 4 clips at [facility].  Stable hemoglobin and hematocrit without any overt loss noted in his stool. 2. Left knee septic arthritis status post surgical washout: Surgical wounds healing well status post removal of sutures.  Reports improved range of motion and endurance using a walker but with intermittent pain of his left knee. 3. End-stage renal disease: Continue current hemodialysis schedule 4 days a week (MWFSa) for volume control with ultrafiltration.  Will order for hemodialysis again tomorrow with judicious ultrafiltration of 2 L. 4. Hypertension/volume: He remains hypervolemic and we will continue efforts at ultrafiltration with hemodialysis that has been limited by his hypotension/LVAD status. 5. Anemia of ESRD: With history of recurrent GI bleed in the setting of end-stage renal disease on hemodialysis-status post PRBC transfusions and now on ESA. 6. Metabolic bone disease: Phosphorus levels low (2.6) without binders, continue VDRA for PTH control  7. 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 8. T2DM 9. OSA  Subjective:   Feeling "washed out" this morning with cramping in his hands overnight after ultrafiltration of 3 L with dialysis yesterday.   Objective:   BP (!) 81/64 (BP Location: Right Arm)   Pulse 95   Temp 98 F (36.7 C) (Oral)   Resp 16   Ht 5\' 5"  (1.651 m)   Wt 68.4 kg (150 lb 12.7 oz)   SpO2 95%   BMI 25.09 kg/m   Physical Exam: Gen: Comfortably sitting up on the side of his bed CVS: Pulse regular rhythm, normal rate, LVAD hum audible over precordium Resp: Clear to auscultation bilaterally, no rales/rhonchi Abd: Soft, obese, nontender Ext: 2+ pitting edema left leg, 1+ edema right leg.  Left knee  surgical scar healing well, left upper arm aVF with audible bruit  Labs: BMET Recent Labs  Lab 05/04/18 0624 05/05/18 0644 05/06/18 0544 05/07/18 0655 05/08/18 0650 05/09/18 0731 05/10/18 0657  NA 131* 135 132* 135 133* 136 136  K 4.3 4.2 4.1 3.7 3.7 3.6 3.6  CL 93* 96* 96* 97* 95* 97* 97*  CO2 29 31 29 31 30 31 31   GLUCOSE 169* 150* 167* 176* 201* 160* 162*  BUN 15 7* 15 9 21 12 9   CREATININE 4.57* 3.29* 4.43* 3.08* 4.42* 3.61* 3.20*  CALCIUM 7.4* 7.5* 7.4* 7.7* 7.7* 8.0* 8.2*  PHOS  --   --   --   --   --  2.6  --    CBC Recent Labs  Lab 05/07/18 0655 05/08/18 0650 05/09/18 0731 05/10/18 0657  WBC 10.9* 11.4* 9.7 10.4  HGB 9.0* 8.8* 9.6* 9.6*  HCT 29.8* 29.2* 32.6* 32.4*  MCV 97.1 96.7 97.6 96.4  PLT 165 170 199 213   Medications:    . atorvastatin  40 mg Oral q1800  . budesonide  0.25 mg Nebulization BID  . busPIRone  5 mg Oral BID  . calcitRIOL  0.5 mcg Oral Q M,W,F  . Chlorhexidine Gluconate Cloth  6 each Topical Q0600  . [START ON 05/11/2018] darbepoetin (ARANESP) injection - DIALYSIS  100 mcg Intravenous Q Mon-HD  . feeding supplement (NEPRO CARB STEADY)  237 mL Oral BID WC  . hydrocerin   Topical BID  . insulin aspart  0-15 Units Subcutaneous TID WC  . insulin  aspart  0-5 Units Subcutaneous QHS  . insulin aspart  3 Units Subcutaneous TID WC  . levothyroxine  25 mcg Oral QAC breakfast  . multivitamin  1 tablet Oral QHS  . pantoprazole  40 mg Oral BID  . senna-docusate  1 tablet Oral BID  . sildenafil  20 mg Oral TID  . traZODone  25 mg Oral QHS  . Warfarin - Pharmacist Dosing Inpatient   Does not apply [id number]   Elmarie Shiley, MD 05/10/2018, 8:59 AM

## 2018-05-10 NOTE — Progress Notes (Signed)
ANTICOAGULATION CONSULT NOTE  Pharmacy Consult for warfarin Indication: LVAD   Patient Measurements: Height: 5\' 5"  (165.1 cm) Weight: 150 lb 12.7 oz (68.4 kg) IBW/kg (Calculated) : 61.5 Heparin Dosing Weight: 79.5 kg  Vital Signs: Temp: 98 F (36.7 C) (07/07 0619) Temp Source: Oral (07/07 0619) BP: 81/64 (07/07 0619) Pulse Rate: 95 (07/07 0619)  Labs: Recent Labs    05/08/18 0650 05/09/18 0731 05/10/18 0657  HGB 8.8* 9.6* 9.6*  HCT 29.2* 32.6* 32.4*  PLT 170 199 213  LABPROT 22.2* 22.1* 18.6*  INR 1.97 1.95 1.57  CREATININE 4.42* 3.61* 3.20*    Medical History: Past Medical History:  Diagnosis Date  . AICD (automatic cardioverter/defibrillator) present   . Asthma   . AVM (arteriovenous malformation) of colon 07/07/2017  . CHF (congestive heart failure) ([place])   . Diabetes ([place])   . ESRF (end stage renal failure) ([place]) 07/07/2017   pt receiving hemodialysis Monday- Wednesday-friday, sometimes Saturday  . Kidney disease   . LVAD (left ventricular assist device) present ([place]) 12/2016  . Permanent atrial fibrillation ([place]) 07/07/2017  . Presence of permanent cardiac pacemaker    AICD  . Sleep apnea     Assessment: 36 yom who underwent HM2 LVAD implantation on 12/14/2015. Has undergoing extensive GI workup to evaluate bleeding. Was stopped on anticoagulants as of admission on 03/09/2018. Last outpatient regimen was (as of 02/17/2018): 7.5 mg daily except 10 mg on Tues/Thurs for goal 2-2.5. Goal INR was decreased to 1.8-2.3 after admission on 02/28/2018. Now s/p laparoscopic surgical enteroscopy with 4 clips on 04/10/2018.  Heparin and warfarin were resumed on 6/27 >> now off heparin infusion. Patient had only received two doses of warfarin prior to INR unexpectedly jumping (peaked at 4.71)- now INR down to 1.5 this am after being stable at 1.9. Dosing lower than previous PTA regimen. CBC and LDH stable. No s/sx of bleeding.   No plan to bridge per MD. Will need to be very  cautious to not overdose given previous trends.   Goal of Therapy:  INR goal: 1.8-2.2 Monitor platelets by anticoagulation protocol: Yes   Plan:  -Warfarin 5mg  PO tonight -Daily INR/CBC  -Monitor s/sx bleeding  Erin Hearing PharmD., BCPS Clinical Pharmacist 05/10/2018 1:38 PM

## 2018-05-10 NOTE — Progress Notes (Signed)
Occupational Therapy Session Note  Patient Details  Name: LAVI SHEEHAN MRN: [id number] Date of Birth: 1946/10/25  Today's Date: 05/10/2018 OT Individual Time: 1015-1106 OT Individual Time Calculation (min): 51 min   Short Term Goals: Week 1:  OT Short Term Goal 1 (Week 1): STG = LTGs due to ELOS  Skilled Therapeutic Interventions/Progress Updates:    Pt greeted EOB. ADL needs met. Reporting pain in Lt knee but opting to wait until session was over for RN intervention. He changed over from wall<battery power with increased time and min vcs. Stand pivot<w/c completed with steady assist. He was motivated to go outdoors. Once he was escorted outside, pt ambulated with RW 7 ft and Min A in shaded area. Pt visibly limiting L LE weightbearing due to pain. Afterwards he reported feeling very fatigued and wanted to lie back down in bed. Once he was escorted back to unit, stand pivot<bed completed with steady assist again, and he changed over to wall power with setup. RN provided pain medicine at this time. He was left with all needs within reach.   Therapy Documentation Precautions:  Precautions Precautions: Other (comment), Fall Precaution Comments: LVAD Required Braces or Orthoses: Other Brace/Splint Other Brace/Splint: hinged knee brace per pt comfort Restrictions Weight Bearing Restrictions: No Pain: Pain Assessment Pain Score: 3  ADL:    See Function Navigator for Current Functional Status.   Therapy/Group: Individual Therapy  Itzel Lowrimore A Kihanna Kamiya 05/10/2018, 12:41 PM

## 2018-05-10 NOTE — Progress Notes (Signed)
Patient ID: Trevor [name], male   DOB: 1946-10-12, 72 y.o.   MRN: [id number]   Advanced Heart Failure VAD Team Note  Subjective:    Doing ok, no GI bleeding.  Working with PT, main limitation currently is left knee pain.   He is eating without problems.   LVAD Interrogation HM 2: Speed: 9200 Flow: 3.9 PI: 5.8 Power: 4.6. - VAD interrogated personally. Parameters stable.   Objective:    Vital Signs:   Temp:  [98 F (36.7 C)-98.4 F (36.9 C)] 98 F (36.7 C) (07/07 0619) Pulse Rate:  [64-95] 95 (07/07 0619) Resp:  [16-18] 16 (07/07 0619) BP: (69-137)/(50-85) 81/64 (07/07 0619) SpO2:  [95 %-99 %] 95 % (07/07 0731) Weight:  [150 lb 12.7 oz (68.4 kg)-160 lb 7.9 oz (72.8 kg)] 150 lb 12.7 oz (68.4 kg) (07/07 0619) Last BM Date: 05/09/18 Mean arterial Pressure 70s  Intake/Output:   Intake/Output Summary (Last 24 hours) at 05/10/2018 1131 Last data filed at 05/10/2018 0900 Gross per 24 hour  Intake 462 ml  Output 3000 ml  Net -2538 ml     Physical Exam    General: Well appearing this am. NAD.  HEENT: Normal. Neck: Supple, JVP 7-8 cm. Carotids OK.  Cardiac:  Mechanical heart sounds with LVAD hum present.  Lungs:  CTAB, normal effort.  Abdomen:  NT, ND, no HSM. No bruits or masses. +BS  LVAD exit site: Well-healed and incorporated. Dressing dry and intact. No erythema or drainage. Stabilization device present and accurately applied. Driveline dressing changed daily per sterile technique. Extremities:  Warm and dry. No cyanosis, clubbing, rash. 1+ ankle edema.  Neuro:  Alert & oriented x 3. Cranial nerves grossly intact. Moves all 4 extremities w/o difficulty. Affect pleasant     Telemetry   Not connected  EKG    No new tracings.    Labs   Basic Metabolic Panel: Recent Labs  Lab 05/06/18 0544 05/07/18 0655 05/08/18 0650 05/09/18 0731 05/10/18 0657  NA 132* 135 133* 136 136  K 4.1 3.7 3.7 3.6 3.6  CL 96* 97* 95* 97* 97*  CO2 29 31 30 31 31   GLUCOSE 167* 176*  201* 160* 162*  BUN 15 9 21 12 9   CREATININE 4.43* 3.08* 4.42* 3.61* 3.20*  CALCIUM 7.4* 7.7* 7.7* 8.0* 8.2*  PHOS  --   --   --  2.6  --     Liver Function Tests: No results for input(s): AST, ALT, ALKPHOS, BILITOT, PROT, ALBUMIN in the last 168 hours. No results for input(s): LIPASE, AMYLASE in the last 168 hours. No results for input(s): AMMONIA in the last 168 hours.  CBC: Recent Labs  Lab 05/06/18 0544 05/07/18 0655 05/08/18 0650 05/09/18 0731 05/10/18 0657  WBC 11.9* 10.9* 11.4* 9.7 10.4  HGB 9.6* 9.0* 8.8* 9.6* 9.6*  HCT 32.1* 29.8* 29.2* 32.6* 32.4*  MCV 96.4 97.1 96.7 97.6 96.4  PLT 166 165 170 199 213    INR: Recent Labs  Lab 05/06/18 0544 05/07/18 0655 05/08/18 0650 05/09/18 0731 05/10/18 0657  INR 2.00 1.88 1.97 1.95 1.57    Other results:  EKG:    Imaging   No results found.   Medications:     Scheduled Medications: . atorvastatin  40 mg Oral q1800  . budesonide  0.25 mg Nebulization BID  . busPIRone  5 mg Oral BID  . calcitRIOL  0.5 mcg Oral Q M,W,F  . Chlorhexidine Gluconate Cloth  6 each Topical Q0600  . [START  ON 05/11/2018] darbepoetin (ARANESP) injection - DIALYSIS  100 mcg Intravenous Q Mon-HD  . feeding supplement (NEPRO CARB STEADY)  237 mL Oral BID WC  . hydrocerin   Topical BID  . insulin aspart  0-15 Units Subcutaneous TID WC  . insulin aspart  0-5 Units Subcutaneous QHS  . insulin aspart  3 Units Subcutaneous TID WC  . levothyroxine  25 mcg Oral QAC breakfast  . multivitamin  1 tablet Oral QHS  . pantoprazole  40 mg Oral BID  . senna-docusate  1 tablet Oral BID  . sildenafil  20 mg Oral TID  . traZODone  25 mg Oral QHS  . Warfarin - Pharmacist Dosing Inpatient   Does not apply q1800    Infusions:   PRN Medications: acetaminophen, albuterol, aluminum hydroxide, benzonatate, bisacodyl, diphenhydrAMINE, guaiFENesin-dextromethorphan, lidocaine (PF), lidocaine-prilocaine, ondansetron (ZOFRAN) IV, oxyCODONE, polyethylene  glycol, traMADol   Patient Profile   Trevor Endsley Murphyis a 72 y.o.malewith a history of CAD s/p CABG x 4 2010, OSA, AS with TAVR 2015, ,ESRD on HD,DM2,chronicatrial fibrillation, asthma,GI bleed,and ischemic cardiomyopathy with Medtronic ICD.S/PHMII LVAD for Destination therapy on 12/14/2015.  Transferred back from [place] on 6/27 after undergoing enterotomy and clipping of a colonic Dueilafoy lesion as well I&D of septic left knee.  Assessment/Plan:    1. GI Bleed/Symptomatic Anemia:GI AVMs. Double balloon retro endoscopy at [facility], unable to identify bleeding lesion. He failed attempted IR embolization x 3 while at [facility]. 12 units PRBCs at [facility]. Received 15 uPRBC while at [facility]. IR consulted, but did not find any active bleeding on CTA 5/20. Repeat capsule study showed active bleeding in the mid ileum.Surgery was reconsulted and recommended transfer back to [facility] for possible surgery.Now s/p laparoscopic surgical enteroscopy with 4 clips on 6/7 at [facility], c/b ischemic bowel/SBO. Now tolerating full diet if he eats in small quantities. Normal BM yesterday, no evidence for overt bleeding.  - Tolerating warfarin. INR 1.57.  Goal INR 1.8-2.3, would not bridge with warfarin.  No ASA.  - Hgb stable today at 9.6. - Getting Aranesp - He will continue octreotide as outpatient.  2. 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 S/pHM II LVAD placed 2052for DT.Has Medtronic ICD. Volume controlled by HD. MWF/Sat schedule.  VAD interrogated personally. Parameters stable.  -Continue sildenafil 20 mg tid. - VAD interrogated personally. Parameters stable. - LDH has been stable .  4. ESRD- M/W/F/Sa - HD per Renal. Continue MWF/Sa schedule.  5. Chronic A VEL:FYBO controlled.No change.  6. HTN:MAP stable.  7.Septicleftknee: s/pseveral aspirations and OR wash out x2. Aspirate cultures positive for staph epi and clostridium perfingens. Blood cultures negative at [facility]. Finished Ancef  7/2.  Ortho saw 6/28  Stitches removed 05/05/18 - WBC trending down. Afebrile.  8. DM II -SSI. No change. 9.Hx ofCAD s/pCABG. - Continue statin 10. Hx ofTAVR:Valvestable on last echo2/2019. No change.  11. Deconditioning - Improving. Appreciate CIR care - No change to current plan.   12. Insomnia: Improved with limiting interruptions in night - Trazadone has for him worked in the past. Continue PRN.  - No change to current plan.    I reviewed the LVAD parameters from today, and compared the results to the patient's prior recorded data.  No programming changes were made.  The LVAD is functioning within specified parameters.  The patient performs LVAD self-test daily.  LVAD interrogation was negative for any significant power changes, alarms or PI events/speed drops.  LVAD equipment check completed and is in good working order.  Back-up equipment present.   LVAD education done on emergency procedures and precautions and reviewed exit site care.   Length of Stay: 5  Loralie Champagne, MD 05/10/2018, 11:31 AM  VAD Team --- VAD ISSUES ONLY--- Pager [phone] (7am - 7am)  Advanced Heart Failure Team  Pager [phone] (M-F; 7a - 4p)  Please contact [facility] for night-coverage after hours (4p -7a ) and weekends on [email or web address]

## 2018-05-10 NOTE — Progress Notes (Signed)
Trevor [name] is a 72 y.o. male 22-Feb-1946 [id number]  Subjective: The patient is still complaining of his food being cold. No new problems. Slept well. Feeling OK.  Objective: Vital signs in last 24 hours: Temp:  [98 F (36.7 C)-98.4 F (36.9 C)] 98 F (36.7 C) (07/07 0619) Pulse Rate:  [64-95] 95 (07/07 0619) Resp:  [16-18] 16 (07/07 0619) BP: (69-137)/(50-85) 81/64 (07/07 0619) SpO2:  [95 %-99 %] 95 % (07/07 0731) Weight:  [150 lb 12.7 oz (68.4 kg)-160 lb 7.9 oz (72.8 kg)] 150 lb 12.7 oz (68.4 kg) (07/07 0619) Weight change: -5 lb 15.2 oz (-2.7 kg) Last BM Date: 05/09/18  Intake/Output from previous day: 07/06 0701 - 07/07 0700 In: 300 [P.O.:300] Out: 3000  Last cbgs: CBG (last 3)  Recent Labs    05/09/18 1753 05/09/18 2134 05/10/18 0620  GLUCAP 146* 230* 152*     Physical Exam General: No apparent distress.  Eating breakfast HEENT: not dry Lungs: Normal effort. Lungs clear to auscultation, no crackles or wheezes. Cardiovascular: Regular rate and rhythm, no edema Abdomen: S/NT/ND; BS(+) Musculoskeletal:  unchanged Neurological: No new neurological deficits Wounds: N/A    Skin: clear  Aging changes Mental state: Alert, oriented, cooperative    Lab Results: BMET    Component Value Date/Time   NA 136 05/10/2018 0657   K 3.6 05/10/2018 0657   CL 97 (L) 05/10/2018 0657   CO2 31 05/10/2018 0657   GLUCOSE 162 (H) 05/10/2018 0657   BUN 9 05/10/2018 0657   CREATININE 3.20 (H) 05/10/2018 0657   CALCIUM 8.2 (L) 05/10/2018 0657   GFRNONAA 18 (L) 05/10/2018 0657   GFRAA 21 (L) 05/10/2018 0657   CBC    Component Value Date/Time   WBC 10.4 05/10/2018 0657   RBC 3.36 (L) 05/10/2018 0657   HGB 9.6 (L) 05/10/2018 0657   HCT 32.4 (L) 05/10/2018 0657   HCT 26.1 (L) 12/08/2016 0334   PLT 213 05/10/2018 0657   MCV 96.4 05/10/2018 0657   MCH 28.6 05/10/2018 0657   MCHC 29.6 (L) 05/10/2018 0657   RDW 19.8 (H) 05/10/2018 0657   LYMPHSABS 0.6 (L) 04/01/2018  0333   MONOABS 0.3 04/01/2018 0333   EOSABS 0.0 04/01/2018 0333   BASOSABS 0.0 04/01/2018 0333    Studies/Results: No results found.  Medications: I have reviewed the patient's current medications.  Assessment/Plan:    1.  LVAD as well as left knee infection.  Deficits with mobility, self-care.  CIR 2.  DVT prophylaxis with Coumadin 3.  Pain management-PRN oxycodone and Ultram 4.  Gastrointestinal bleeding due to AVMs with a BLA.  Continue with Protonix twice daily.  Monitor CBC 5.  Coronary artery disease status post bypass surgery 6.  Type 2 diabetes.  Continue with moderate sensitivity sliding scale 7.  Septic knee.  Antibiotics were completed on 05/04/2018 8.  End-stage renal disease.  Hemodialysis on Monday Wednesday Friday 9.  Anxiety.  Continue with BuSpar 10.  Insomnia.  Continue with trazodone 11.  Hypertension       Length of stay, days: 5  Walker Kehr , MD 05/10/2018, 10:19 AM

## 2018-05-10 NOTE — Progress Notes (Signed)
Physical Therapy Session Note  Patient Details  Name: Trevor [name] MRN: [id number] Date of Birth: [birth year]  Today's Date: 05/10/2018 PT Individual Time: 0930-1015 PT Individual Time Calculation (min): 45 min   Short Term Goals: Week 1:  PT Short Term Goal 1 (Week 1): =LTGs due to ELOS  Skilled Therapeutic Interventions/Progress Updates:  Pt was seen bedside in the am. Pt transferred supine to edge of bed with S. Pt performed sit to stand and stand pivot transfers with min guard and verbal cues. Pt ambulated 10, 20, 15, 5 feet with rolling walker and min guard. Pt rode Nu step x 9 minutes with 1 rest break at level 1 to increased ROM B LEs. Pt returned to room following treatment and left sitting up on edge of bed with call bell within reach and bed alarm on.   Therapy Documentation Precautions:  Precautions Precautions: Other (comment), Fall Precaution Comments: LVAD Required Braces or Orthoses: Other Brace/Splint Other Brace/Splint: hinged knee brace per pt comfort Restrictions Weight Bearing Restrictions: No General:   Pain: Pt c/o 7/10 L knee pain.   See Function Navigator for Current Functional Status.   Therapy/Group: Individual Therapy  Dub Amis 05/10/2018, 12:41 PM

## 2018-05-11 ENCOUNTER — Inpatient Hospital Stay (HOSPITAL_COMMUNITY): Payer: Medicare Other | Admitting: Occupational Therapy

## 2018-05-11 ENCOUNTER — Inpatient Hospital Stay (HOSPITAL_COMMUNITY): Payer: Medicare Other

## 2018-05-11 DIAGNOSIS — M009 Pyogenic arthritis, unspecified: Secondary | ICD-10-CM

## 2018-05-11 LAB — BASIC METABOLIC PANEL
Anion gap: 9 (ref 5–15)
BUN: 19 mg/dL (ref 8–23)
CHLORIDE: 97 mmol/L — AB (ref 98–111)
CO2: 29 mmol/L (ref 22–32)
Calcium: 7.9 mg/dL — ABNORMAL LOW (ref 8.9–10.3)
Creatinine, Ser: 4.64 mg/dL — ABNORMAL HIGH (ref 0.61–1.24)
GFR calc non Af Amer: 11 mL/min — ABNORMAL LOW (ref >60)
GFR, EST AFRICAN AMERICAN: 13 mL/min — AB (ref >60)
Glucose, Bld: 167 mg/dL — ABNORMAL HIGH (ref 70–99)
POTASSIUM: 3.7 mmol/L (ref 3.5–5.1)
SODIUM: 135 mmol/L (ref 135–145)

## 2018-05-11 LAB — CBC
HEMATOCRIT: 28.3 % — AB (ref 39.0–52.0)
Hemoglobin: 8.5 g/dL — ABNORMAL LOW (ref 13.0–17.0)
MCH: 28.6 pg (ref 26.0–34.0)
MCHC: 30 g/dL (ref 30.0–36.0)
MCV: 95.3 fL (ref 78.0–100.0)
Platelets: 162 10*3/uL (ref 150–400)
RBC: 2.97 MIL/uL — AB (ref 4.22–5.81)
RDW: 19.4 % — ABNORMAL HIGH (ref 11.5–15.5)
WBC: 8.7 10*3/uL (ref 4.0–10.5)

## 2018-05-11 LAB — PROTIME-INR
INR: 1.82
Prothrombin Time: 20.9 seconds — ABNORMAL HIGH (ref 11.4–15.2)

## 2018-05-11 LAB — GLUCOSE, CAPILLARY
GLUCOSE-CAPILLARY: 203 mg/dL — AB (ref 70–99)
Glucose-Capillary: 150 mg/dL — ABNORMAL HIGH (ref 70–99)
Glucose-Capillary: 119 mg/dL — ABNORMAL HIGH (ref 70–99)

## 2018-05-11 LAB — LACTATE DEHYDROGENASE: LDH: 196 U/L — ABNORMAL HIGH (ref 98–192)

## 2018-05-11 MED ORDER — PANTOPRAZOLE SODIUM 40 MG PO TBEC: 40.0000 mg | DELAYED_RELEASE_TABLET | Freq: Every day | ORAL | Status: DC

## 2018-05-11 MED ORDER — DARBEPOETIN ALFA 100 MCG/0.5ML IJ SOSY
100.0000 ug | PREFILLED_SYRINGE | INTRAMUSCULAR | Status: DC
  Filled 2018-05-11: qty 0.5

## 2018-05-11 MED ORDER — WARFARIN SODIUM 4 MG PO TABS
4.0000 mg | ORAL_TABLET | Freq: Once | ORAL | Status: AC
  Administered 2018-05-11: 4 mg via ORAL
  Filled 2018-05-11: qty 1

## 2018-05-11 MED ORDER — DARBEPOETIN ALFA 100 MCG/0.5ML IJ SOSY
100.0000 ug | PREFILLED_SYRINGE | INTRAMUSCULAR | Status: DC
  Administered 2018-05-11 – 2018-05-18 (×2): 100 ug via SUBCUTANEOUS
  Filled 2018-05-11 (×2): qty 0.5

## 2018-05-11 NOTE — Progress Notes (Signed)
LVAD Coordinator Rounding Note:  HM II LVAD implanted on 12/13/16 by Dr. Darcey Nora under Destination Therapy criteria due to age excluding heart transplantation.  Transferred from [facility] on 04/30/18 following laparoscopic surgical enteroscopy on 6/7 and received 4 clips. He received 13 uPRBCs but had no further bleeding post-op. Last octreotide injection 6/13. Danazol was DC'd. ASA and coumadin were not resumed. VAD parameters were stable.   Course complicated by AMS and concern for ischemic bowel. Improved with NGT decompression and bowel rest. Head CT negative. Blood cultures and UA were negative. Tolerated clears and thought stable for diet advancement.    Additionally, required multiple left knee aspirations and OR washouts. Aspirate cultures positive for staph epi and clostridium perfingen. Started on Vanc and narrowed to Ancef.   Pt doing well. States that he is mainly limited by knee pain for his mobility.  Vital signs: Temp:  98.3 HR: 70 Automatic BP:  103/69(79) Doppler: 80 O2 Sat: 98% RA Wt:174>166>160 lbs  LVAD interrogation reveals:  Speed:  9200 Flow:  4.6 Power: 5.2 w PI: 5.2 Alarms: none Events:  Fixed speed: 9200 Low speed limit: 8600   Drive Line:  Right abdominal dressing dry and intact; anchor intact. Weekly dressing changes per VAD coordinator. Next dressing change due 05/12/18.    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 Margins not clear at neck. - 02/23/18> hospitalization for GI bleed. Colon/EGD 02/27/18 with normal EGD. Colonoscopy identified active bleeding in ileum; source not found. Capsule Endoscopy 02/27/18: Showed active bleeding in the ileum. Received 5 units blood with histamine reaction; will give benadryl as OP. - 03/10/18>transferrred to [facility] for double retro balloon scop and sent to IR emolization x 3 with no evidence of bleeding. Received 12 units PCs, remained off anticoagulants. Transferred back to [facility] 03/20/18 Seen by IR 5/20. CTA obtained, but no active bleeding found. Started on octreotide. 03/25/18: Repeat capsule endoscopy completed. Bleeding appears to come from the mid-ileum. 03/26/18: General surgery reconsulted. Recommended transfer back to [facility] for surgery with intraoperative endoscopy. [name] currently discussing options. Ortho consulted 5/24 for left knee pain. S/p knee aspiration and steroid injection.  Dose of premarin given 5/24.  On 5/25 developed abdominal bloating and distension with n/v. KUB suggestive of ileus versus early SBO. NGT placed for decompression.  Had large bloody BM after this and felt much better, diet now advanced to regular.  - 04/01/18 transferred to [facility] for  laparoscopic surgical enteroscopy for recurrent GI bleed   Plan/Recommendations: 1.  Weekly dressing changes per VAD coordinator. Next dressing change due 05/12/18. 2.  Call VAD pager if any questions re: VAD equipment or drive line site issues.   [name], VAD Coordinator 24/7 VAD pager: [phone]

## 2018-05-11 NOTE — Progress Notes (Signed)
Physical Therapy Session Note  Patient Details  Name: Trevor [name] MRN: [id number] Date of Birth: 1946/10/11  Today's Date: 05/11/2018 PT Individual Time: 0930-1015 PT Individual Time Calculation (min): 45 min   Short Term Goals: Week 1:  PT Short Term Goal 1 (Week 1): =LTGs due to ELOS  Skilled Therapeutic Interventions/Progress Updates:    Pt reports feeling weaker today and more pain in LLE. Pt agreeable to session and limited to within pt tolerance. Pt request to utilize Nustep for warm-up for AAROM to LLE. Pt performed functional transfers throughout session with steadying assist to supervision level with cues for correct hand placement. Short distance gait x 2 reps up to 20' with close supervision to steadying assist and limited WB noted on LLE for functional mobility training. On Nustep, level 2 x 10 min total with self selected pace and ROM to address LLE ROM, edema control, and cardiovascular endurance and general strengthening. PT donned ACE wrap to LLE for edema control and compression until correct Tedhose size arrives. Pt able to disconnect and reconnect from wall power unit with set-up assist for LVAD management. Pt request to return to bed and required min assist for LLE management.   Therapy Documentation Precautions:  Precautions Precautions: Other (comment), Fall Precaution Comments: LVAD Required Braces or Orthoses: Other Brace/Splint Other Brace/Splint: hinged knee brace per pt comfort Restrictions Weight Bearing Restrictions: No  Pain: 7/10 pain in L knee - premedicated. Applied ACE wrap to LLE for edema control and compression with pt reporting some relief. Declined use of knee brace and states current size Tedhose do not fit (asked Network engineer to order larger size).   See Function Navigator for Current Functional Status.   Therapy/Group: Individual Therapy  Canary Brim Ivory Broad, PT, DPT  05/11/2018, 10:21 AM

## 2018-05-11 NOTE — Progress Notes (Signed)
ANTICOAGULATION CONSULT NOTE  Pharmacy Consult for warfarin Indication: LVAD   Patient Measurements: Height: 5\' 5"  (165.1 cm) Weight: 158 lb 15.2 oz (72.1 kg) IBW/kg (Calculated) : 61.5 Heparin Dosing Weight: 79.5 kg  Vital Signs: Temp: 98.3 F (36.8 C) (07/08 0531) Temp Source: Oral (07/08 0531) BP: 103/69 (07/08 0627) Pulse Rate: 72 (07/08 0627)  Labs: Recent Labs    05/09/18 0731 05/10/18 0657 05/11/18 0537  HGB 9.6* 9.6* 8.5*  HCT 32.6* 32.4* 28.3*  PLT 199 213 162  LABPROT 22.1* 18.6* 20.9*  INR 1.95 1.57 1.82  CREATININE 3.61* 3.20* 4.64*    Medical History: Past Medical History:  Diagnosis Date  . AICD (automatic cardioverter/defibrillator) present   . Asthma   . AVM (arteriovenous malformation) of colon 07/07/2017  . CHF (congestive heart failure) ([place])   . Diabetes ([place])   . ESRF (end stage renal failure) ([place]) 07/07/2017   pt receiving hemodialysis Monday- Wednesday-friday, sometimes Saturday  . Kidney disease   . LVAD (left ventricular assist device) present ([place]) 12/2016  . Permanent atrial fibrillation ([place]) 07/07/2017  . Presence of permanent cardiac pacemaker    AICD  . Sleep apnea     Assessment: 63 yom who underwent HM2 LVAD implantation on 12/14/2015. Has undergoing extensive GI workup to evaluate bleeding. Was stopped on anticoagulants as of admission on 03/09/2018. Last outpatient regimen was (as of 02/17/2018): 7.5 mg daily except 10 mg on Tues/Thurs for goal 2-2.5. Goal INR was decreased to 1.8-2.3 after admission on 02/28/2018. Now s/p laparoscopic surgical enteroscopy with 4 clips on 04/10/2018.  Heparin and warfarin were resumed on 6/27 >> now off heparin infusion. Patient had only received two doses of warfarin prior to INR unexpectedly jumping (peaked at 4.71) previously - have been dosing lower than previous home regimen since.  INR today is 1.82, received 5 mg last night. Hgb 9.6 to 8.5, plt 196. LDH stable. No s/sx of bleeding noted. No new  medication interactions.  No plan to bridge per MD. Will need to be very cautious to not overdose given previous trends.   Goal of Therapy:  INR goal: 1.8-2.2 Monitor platelets by anticoagulation protocol: Yes   Plan:  -Warfarin 4 mg PO tonight -Daily INR/CBC  -Monitor s/sx bleeding  Doylene Canard, PharmD Clinical Pharmacist  Pager: [phone] Phone: [phone] 05/11/2018 11:53 AM

## 2018-05-11 NOTE — Progress Notes (Signed)
[facility] PHYSICAL MEDICINE & REHABILITATION     PROGRESS NOTE  Subjective/Complaints:  Pt up at eob. Feels fairly well and that his strength is improving. Denies pain. Appetite getting better too  ROS: Patient denies fever, rash, sore throat, blurred vision, nausea, vomiting, diarrhea, cough, shortness of breath or chest pain, joint or back pain, headache, or mood change.    Objective: Vital Signs: Blood pressure 103/69, pulse 72, temperature 98.3 F (36.8 C), temperature source Oral, resp. rate 17, height 5\' 5"  (1.651 m), weight 72.1 kg (158 lb 15.2 oz), SpO2 98 %. No results found. Recent Labs    05/10/18 0657 05/11/18 0537  WBC 10.4 8.7  HGB 9.6* 8.5*  HCT 32.4* 28.3*  PLT 213 162   Recent Labs    05/10/18 0657 05/11/18 0537  NA 136 135  K 3.6 3.7  CL 97* 97*  GLUCOSE 162* 167*  BUN 9 19  CREATININE 3.20* 4.64*  CALCIUM 8.2* 7.9*   CBG (last 3)  Recent Labs    05/10/18 1642 05/10/18 2114 05/11/18 0624  GLUCAP 78 206* 150*    Wt Readings from Last 3 Encounters:  05/11/18 72.1 kg (158 lb 15.2 oz)  05/05/18 74.2 kg (163 lb 9.3 oz)  04/01/18 90.6 kg (199 lb 11.8 oz)    Physical Exam:  BP 103/69   Pulse 72   Temp 98.3 F (36.8 C) (Oral)   Resp 17   Ht 5\' 5"  (1.651 m)   Wt 72.1 kg (158 lb 15.2 oz)   SpO2 98%   BMI 26.45 kg/m  Constitutional: No distress . Vital signs reviewed. HEENT: EOMI, oral membranes moist Neck: supple Cardiovascular: RRR without murmur. No JVD    Respiratory: hum  GI: BS +, non-tender, non-distended  Musculoskeletal: B/l LE edema L>R. Mild TTP left knee--limited knee extensoin, tight hamstrings, 2+ pedal edema Neurological: He is alert.  Motor: B/l UE: 4+/5 proximal to distal LLE: 2-3/5 proximal to distal. ;eft knee limited by pain RLE: HF 3/5,   ADF 4/5 (unchanged) Skin:  Left knee with erythema   No drainage.  BLE with dry flaky skin.  Vascular changes b/l LE Psychiatric: He has a normal mood and affect. His behavior  is normal.   Assessment/Plan: 1. Functional deficits secondary to debility which require 3+ hours per day of interdisciplinary therapy in a comprehensive inpatient rehab setting. Physiatrist is providing close team supervision and 24 hour management of active medical problems listed below. Physiatrist and rehab team continue to assess barriers to discharge/monitor patient progress toward functional and medical goals.  Function:  Bathing Bathing position Bathing activity did not occur: Refused    Bathing parts      Bathing assist        Upper Body Dressing/Undressing Upper body dressing   What is the patient wearing?: Pull over shirt/dress                Upper body assist Assist Level: Set up   Set up : To obtain clothing/put away  Lower Body Dressing/Undressing Lower body dressing Lower body dressing/undressing activity did not occur: Refused What is the patient wearing?: Pants                              Lower body assist        Toileting Toileting   Toileting steps completed by patient: Adjust clothing prior to toileting, Adjust clothing after toileting, Performs perineal hygiene Toileting  steps completed by helper: Performs perineal hygiene Toileting Assistive Devices: Grab bar or rail  Toileting assist Assist level: Touching or steadying assistance (Pt.75%)   Transfers Chair/bed transfer   Chair/bed transfer method: Stand pivot Chair/bed transfer assist level: Touching or steadying assistance (Pt > 75%) Chair/bed transfer assistive device: Walker, Air cabin crew     Max distance: 20 Assist level: Touching or steadying assistance (Pt > 75%)   Wheelchair   Type: Manual Max wheelchair distance: 150' Assist Level: Supervision or verbal cues  Cognition Comprehension Comprehension assist level: Follows complex conversation/direction with extra time/assistive device  Expression Expression assist level: Expresses complex  ideas: With extra time/assistive device  Social Interaction Social Interaction assist level: Interacts appropriately with others with medication or extra time (anti-anxiety, antidepressant).  Problem Solving Problem solving assist level: Solves complex problems: With extra time  Memory Memory assist level: Complete Independence: No helper    Medical Problem List and Plan: 1.  Deficits with mobility, self-care secondary to LVAD as well as left knee infection/aspiration.  Continue CIR 2.  DVT Prophylaxis/Anticoagulation: Pharmaceutical: Coumadin. Being followed closely by pharmacy.   INR 1.82 7/8 3. Pain Management: continue oxycodone and/or ultram for pain. Monitor for signs of overdose as HD dependent.  4. Mood: team to provide ego support. LCSW to follow for evaluation and support.  5. Neuropsych: This patient appears capable of making decisions on his own behalf. 6. Skin/Wound Care: routine pressure relief measures.  7. Fluids/Electrolytes/Nutrition: Renal diet --decrease fluid restriction to 1200.Marland Kitchen  Monitor weights daily. Strict I/O.  8. GIB due to AVMs with ABLA: On Protonix BID. Continue to monitor stools for melena/signs of bleeding. Augment bowel program as needed  Hemoglobin 8.5 on 7/8  Continue to monitor 9. CAD s/p CABG/ICM--s/p LVAD: Strict I/O with daily weights. LVAD self test daily. Monitor LVAD per protocol Filed Weights   05/09/18 1720 05/10/18 0619 05/11/18 0532  Weight: 68.7 kg (151 lb 7.3 oz) 68.4 kg (150 lb 12.7 oz) 72.1 kg (158 lb 15.2 oz)   -increased today---watch for trend 10 T2DM: Not on lantus at this time. Continue 3 units with meals and moderate SSI.   Inconsistent control---no changes today 11. Septic Knee: Has completed antibiotic course on 05/04/18  -need to utilize knee brace for stability/pain control 12. ESRD: HD MWFSa to help with fluid overload/anasarca.  13. Sleep disturbance: Scheduled trazodone as at home.   14. Anxiety disorder: continue Buspar tid.   15. Leukocytosis  WBCs 8.7 7/8  Afebrile  Continue to monitor 16. Hypotension  Asymptomatic at present  LOS (Days) 6 A FACE TO FACE EVALUATION WAS PERFORMED  Meredith Staggers 05/11/2018 8:44 AM

## 2018-05-11 NOTE — Care Management (Signed)
[facility] Individual Statement of Services  Patient Name:  Trevor [name]  Date:  05/11/2018  Welcome to the [facility].  Our goal is to provide you with an individualized program based on your diagnosis and situation, designed to meet your specific needs.  With this comprehensive rehabilitation program, you will be expected to participate in at least 3 hours of rehabilitation therapies Monday-Friday, with modified therapy programming on the weekends.  Your rehabilitation program will include the following services:  Physical Therapy (PT), Occupational Therapy (OT), 24 hour per day rehabilitation nursing, Therapeutic Recreaction (TR), Neuropsychology, Case Management (Social Worker), Rehabilitation Medicine, Nutrition Services and Pharmacy Services  Weekly team conferences will be held on Tuesdays to discuss your progress.  Your Social Worker will talk with you frequently to get your input and to update you on team discussions.  Team conferences with you and your family in attendance may also be held.  Expected length of stay: 7-10 days   Overall anticipated outcome: independent to supervision  Depending on your progress and recovery, your program may change. Your Social Worker will coordinate services and will keep you informed of any changes. Your Social Worker's name and contact numbers are listed  below.  The following services may also be recommended but are not provided by the [facility] will be made to provide these services after discharge if needed.  Arrangements include referral to agencies that provide these services.  Your insurance has been verified to be:  Medicare and [place] Your primary doctor is:  Dr. Scarlette Calico  Pertinent information will be shared with your doctor and your insurance  company.  Social Worker:  [place], [place] or ([phone]   Information discussed with and copy given to patient by: Lennart Pall, 05/08/2018, 4:36 PM

## 2018-05-11 NOTE — Progress Notes (Signed)
Physical Therapy Session Note  Patient Details  Name: Trevor [name] MRN: [id number] Date of Birth: 1945-12-21  Today's Date: 05/11/2018 PT Individual Time: 1100-1200 PT Individual Time Calculation (min): 60 min   Short Term Goals: Week 1:  PT Short Term Goal 1 (Week 1): =LTGs due to ELOS     Skilled Therapeutic Interventions/Progress Updates:   Pt asleep sitting with trunk on bed, with bil feet on floor.  He awakened easily.  Pt directed and switched to batteries for his LVAD in sitting EOB.  Gait in room over level tile with RW x 10' before stating he needed to sit due to increased RL knee pain.  L knee flexiblity and activity tolerance on NuSTep at level 4 x 8 minutes.  Pt unable to tolerate full L knee extension.  Standing activity x 3.5 minutes without leaning against table, during manipulation of Rx bottles lids with bil hands, L foot on floor.  Gait training x 20' in hallway with RW with min guard assist.  Pt managed LVAD independently ,back to wall connections, once cart was set near him.  Pt left resting sitting EOB, with Melanie, Therapist, sports in attendance.  PT requested Melanie set bed alarm.     Therapy Documentation Precautions:  Precautions Precautions: Other (comment), Fall Precaution Comments: LVAD Required Braces or Orthoses: Other Brace/Splint Other Brace/Splint: hinged knee brace per pt comfort Restrictions Weight Bearing Restrictions: No General:   Vital Signs:   Pain: Pain Assessment Pain Scale: 0-10 Pain Score:8, during ambulation  Pain Intervention(s): Medication (See eMAR) Multiple Pain Sites: No    See Function Navigator for Current Functional Status.   Therapy/Group: Individual Therapy  Grover Woodfield 05/11/2018, 12:22 PM

## 2018-05-11 NOTE — Progress Notes (Signed)
[facility] Progress Note  Subjective: no new /co  Vitals:   05/11/18 1430 05/11/18 1445 05/11/18 1500 05/11/18 1530  BP: (!) 87/67 (!) 88/60 113/62 104/60  Pulse: 70 72 70 70  Resp:      Temp:      TempSrc:      SpO2:      Weight:      Height:        Inpatient medications: . atorvastatin  40 mg Oral q1800  . budesonide  0.25 mg Nebulization BID  . busPIRone  5 mg Oral BID  . calcitRIOL  0.5 mcg Oral Q M,W,F  . Chlorhexidine Gluconate Cloth  6 each Topical Q0600  . darbepoetin (ARANESP) injection - DIALYSIS  100 mcg Subcutaneous Q Mon-HD  . feeding supplement (NEPRO CARB STEADY)  237 mL Oral BID WC  . hydrocerin   Topical BID  . insulin aspart  0-15 Units Subcutaneous TID WC  . insulin aspart  0-5 Units Subcutaneous QHS  . insulin aspart  3 Units Subcutaneous TID WC  . levothyroxine  25 mcg Oral QAC breakfast  . multivitamin  1 tablet Oral QHS  . pantoprazole  40 mg Oral BID  . [START ON 05/31/2018] pantoprazole  40 mg Oral Daily  . senna-docusate  1 tablet Oral BID  . sildenafil  20 mg Oral TID  . traZODone  25 mg Oral QHS  . warfarin  4 mg Oral ONCE-1800  . Warfarin - Pharmacist Dosing Inpatient   Does not apply q1800    acetaminophen, albuterol, aluminum hydroxide, benzonatate, bisacodyl, diphenhydrAMINE, guaiFENesin-dextromethorphan, lidocaine (PF), lidocaine-prilocaine, ondansetron (ZOFRAN) IV, oxyCODONE, polyethylene glycol, traMADol  Exam: Lying in bed, on hd now  no jvd  chest cta bilat  cor lvad hum  abd soft no ascites  ext mild 1+ edema , L knee wrapped  nf, ox 3  L arm AVF+bruit  Dialysis: MWFSat [facility] (has been inpatient since May 2019)  4.5hr  83 kg (now down to 73- 74 kg)   2/2 bath  L AVF Hep none      Impression: 1  Debility - on CIR now 2  GIB - sp mult procedures, back on coumadin 3  ICM sp LVAD 4  esrd - on HD mwf and extra Sat for volume control; keep map > 60 on HD 5  Volume - no gross excess new edw is around 73kg 6  mbd  ckd - no changes 7  diab mellitus 2 - on insulin 8  Hx TAVR 9  Anemia ckd - Hb 8.5, getting esa w/ darbe 100 ug every Monday on HD 10 Septic L knee - sp course of IV abx and sp I&D at [facility] - HD today UF 2 L as Ronnie Derby MD [facility] pager [phone]   05/11/2018, 3:33 PM   Recent Labs  Lab 05/09/18 0731 05/10/18 0657 05/11/18 0537  NA 136 136 135  K 3.6 3.6 3.7  CL 97* 97* 97*  CO2 31 31 29   GLUCOSE 160* 162* 167*  BUN 12 9 19   CREATININE 3.61* 3.20* 4.64*  CALCIUM 8.0* 8.2* 7.9*  PHOS 2.6  --   --   INR 1.95 1.57 1.82   No results for input(s): AST, ALT, ALKPHOS, BILITOT, PROT in the last 168 hours. Recent Labs  Lab 05/10/18 0657 05/11/18 0537  WBC 10.4 8.7  HGB 9.6* 8.5*  HCT 32.4* 28.3*  MCV 96.4 95.3  PLT 213 162  Iron/TIBC/Ferritin/ %Sat    Component Value Date/Time   IRON 125 06/14/2017 1605   TIBC 221 (L) 06/14/2017 1605   FERRITIN 622 (H) 06/14/2017 1605   IRONPCTSAT 57 (H) 06/14/2017 1605

## 2018-05-11 NOTE — Progress Notes (Addendum)
Patient ID: Trevor [name], male   DOB: [birth year], 72 y.o.   MRN: [id number]   Advanced Heart Failure VAD Team Note  Subjective:    Continues to work with therapy. Denies SOB.   LVAD Interrogation HM 2: Speed: 9200 Flow: 4.5  PI: 6.1  Power: 56. - VAD interrogated personally. Parameters stable.   Objective:    Vital Signs:   Temp:  [98.3 F (36.8 C)-98.7 F (37.1 C)] 98.3 F (36.8 C) (07/08 0531) Pulse Rate:  [55-72] 72 (07/08 0627) Resp:  [16-17] 17 (07/08 0531) BP: (82-103)/(68-75) 103/69 (07/08 0627) SpO2:  [98 %-100 %] 98 % (07/08 0531) Weight:  [158 lb 15.2 oz (72.1 kg)] 158 lb 15.2 oz (72.1 kg) (07/08 0532) Last BM Date: 05/09/18 Mean arterial Pressure 70s  Intake/Output:   Intake/Output Summary (Last 24 hours) at 05/11/2018 0856 Last data filed at 05/11/2018 0700 Gross per 24 hour  Intake 684 ml  Output -  Net 684 ml     Physical Exam    Physical Exam: GENERAL: no acute distress. Sitting on the side of the bed.  HEENT: normal  NECK: Supple, JVP ~10 .  2+ bilaterally, no bruits.  No lymphadenopathy or thyromegaly appreciated.   CARDIAC:  Mechanical heart sounds with LVAD hum present.  LUNGS:  Clear to auscultation bilaterally.  ABDOMEN:  Soft, round, nontender, positive bowel sounds x4.     LVAD exit site: well-healed and incorporated.  Dressing dry and intact.  No erythema or drainage.  Stabilization device present and accurately applied.  Driveline dressing is being changed daily per sterile technique. EXTREMITIES:  Warm and dry, no cyanosis, clubbing, rash. R and LLE trace-1+edema.   NEUROLOGIC:  Alert and oriented x 4.    No aphasia.  No dysarthria.  Affect pleasant.      Telemetry   N/A  EKG    No new tracings.    Labs   Basic Metabolic Panel: Recent Labs  Lab 05/07/18 0655 05/08/18 0650 05/09/18 0731 05/10/18 0657 05/11/18 0537  NA 135 133* 136 136 135  K 3.7 3.7 3.6 3.6 3.7  CL 97* 95* 97* 97* 97*  CO2 31 30 31 31 29   GLUCOSE 176*  201* 160* 162* 167*  BUN 9 21 12 9 19   CREATININE 3.08* 4.42* 3.61* 3.20* 4.64*  CALCIUM 7.7* 7.7* 8.0* 8.2* 7.9*  PHOS  --   --  2.6  --   --     Liver Function Tests: No results for input(s): AST, ALT, ALKPHOS, BILITOT, PROT, ALBUMIN in the last 168 hours. No results for input(s): LIPASE, AMYLASE in the last 168 hours. No results for input(s): AMMONIA in the last 168 hours.  CBC: Recent Labs  Lab 05/07/18 0655 05/08/18 0650 05/09/18 0731 05/10/18 0657 05/11/18 0537  WBC 10.9* 11.4* 9.7 10.4 8.7  HGB 9.0* 8.8* 9.6* 9.6* 8.5*  HCT 29.8* 29.2* 32.6* 32.4* 28.3*  MCV 97.1 96.7 97.6 96.4 95.3  PLT 165 170 199 213 162    INR: Recent Labs  Lab 05/07/18 0655 05/08/18 0650 05/09/18 0731 05/10/18 0657 05/11/18 0537  INR 1.88 1.97 1.95 1.57 1.82    Other results:  EKG:    Imaging   No results found.   Medications:     Scheduled Medications: . atorvastatin  40 mg Oral q1800  . budesonide  0.25 mg Nebulization BID  . busPIRone  5 mg Oral BID  . calcitRIOL  0.5 mcg Oral Q M,W,F  . Chlorhexidine Gluconate Cloth  6 each Topical Q0600  . darbepoetin (ARANESP) injection - DIALYSIS  100 mcg Intravenous Q Mon-HD  . feeding supplement (NEPRO CARB STEADY)  237 mL Oral BID WC  . hydrocerin   Topical BID  . insulin aspart  0-15 Units Subcutaneous TID WC  . insulin aspart  0-5 Units Subcutaneous QHS  . insulin aspart  3 Units Subcutaneous TID WC  . levothyroxine  25 mcg Oral QAC breakfast  . multivitamin  1 tablet Oral QHS  . pantoprazole  40 mg Oral BID  . senna-docusate  1 tablet Oral BID  . sildenafil  20 mg Oral TID  . traZODone  25 mg Oral QHS  . Warfarin - Pharmacist Dosing Inpatient   Does not apply q1800    Infusions:   PRN Medications: acetaminophen, albuterol, aluminum hydroxide, benzonatate, bisacodyl, diphenhydrAMINE, guaiFENesin-dextromethorphan, lidocaine (PF), lidocaine-prilocaine, ondansetron (ZOFRAN) IV, oxyCODONE, polyethylene glycol,  traMADol   Patient Profile   Slaton Reaser Murphyis a 72 y.o.malewith a history of CAD s/p CABG x 4 2010, OSA, AS with TAVR 2015, ,ESRD on HD,DM2,chronicatrial fibrillation, asthma,GI bleed,and ischemic cardiomyopathy with Medtronic ICD.S/PHMII LVAD for Destination therapy on 12/14/2015.  Transferred back from [place] on 6/27 after undergoing enterotomy and clipping of a colonic Dueilafoy lesion as well I&D of septic left knee.  Assessment/Plan:    1. GI Bleed/Symptomatic Anemia:GI AVMs. Double balloon retro endoscopy at [facility], unable to identify bleeding lesion. He failed attempted IR embolization x 3 while at [facility]. 12 units PRBCs at [facility]. Received 15 uPRBC while at [facility]. IR consulted, but did not find any active bleeding on CTA 5/20. Repeat capsule study showed active bleeding in the mid ileum.Surgery was reconsulted and recommended transfer back to [facility] for possible surgery.Now s/p laparoscopic surgical enteroscopy with 4 clips on 6/7 at [facility], c/b ischemic bowel/SBO. Now tolerating full diet if he eats in small quantities. Normal BM yesterday, no evidence for overt bleeding.  - Tolerating warfarin. INR 1.82. oal INR 1.8-2.3, would not bridge with warfarin.  No ASA.  - Hgb dwon from 9.6>8.5 . CBC in am.  - Getting Aranesp - He will continue octreotide as outpatient.  2. 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 S/pHM II LVAD placed 2067for DT.Has Medtronic ICD. Volume controlled by HD. MWF/Sat schedule.  VAD interrogated personally. Parameters stable.  -Continue sildenafil 20 mg tid. - VAD interrogated personally. Parameters stable. - LDH stable 4. ESRD- M/W/F/Sa - HD per Renal. Continue MWF/Sa schedule.  5. Chronic A JKK:XFGH controlled.No change.  6. HTN:MAP stable.  7.Septicleftknee: s/pseveral aspirations and OR wash out x2. Aspirate cultures positive for staph epi and clostridium perfingens. Blood cultures negative at [facility]. Finished Ancef 7/2.   Ortho saw 6/28  Stitches removed 05/05/18 - WBC 8.7. Afebrile.   8. DM II -SSI. No change. 9.Hx ofCAD s/pCABG. - Continue statin 10. Hx ofTAVR:Valvestable on last echo2/2019. No change.  11. Deconditioning - Appreciate CIR care - No change to current plan.   12. Insomnia: Improved with limiting interruptions in night - Trazadone has for him worked in the past. Continue PRN.  - No change to current plan.    I reviewed the LVAD parameters from today, and compared the results to the patient's prior recorded data.  No programming changes were made.  The LVAD is functioning within specified parameters.  The patient performs LVAD self-test daily.  LVAD interrogation was negative for any significant power changes, alarms or PI events/speed drops.  LVAD equipment check completed and is in good working  order.  Back-up equipment present.   LVAD education done on emergency procedures and precautions and reviewed exit site care.   Length of Stay: [place], NP 05/11/2018, 8:56 AM  VAD Team --- VAD ISSUES ONLY--- Pager [phone] (7am - 7am)  Advanced Heart Failure Team  Pager [phone] (M-F; 7a - 4p)  Please contact [facility] for night-coverage after hours (4p -7a ) and weekends on [email or web address]  Patient seen with NP, agree with the above note.  Getting HD today, no problems.  Hgb lower, no overt bleeding.  Will need to continue to watch this closely.  Otherwise, he is stable.   Loralie Champagne 05/11/2018 1:21 PM

## 2018-05-11 NOTE — Progress Notes (Signed)
Occupational Therapy Session Note  Patient Details  Name: Trevor [name] MRN: [id number] Date of Birth: [birth year]  Today's Date: 05/11/2018 OT Individual Time: 4010-2725 OT Individual Time Calculation (min): 70 min   Short Term Goals: Week 1:  OT Short Term Goal 1 (Week 1): STG = LTGs due to ELOS  Skilled Therapeutic Interventions/Progress Updates:    Pt greeted EOB after breakfast. Requesting to use restroom and motivated to try ambulating to standard toilet. He did so with steady assist using RW. Hygiene completed post B+B while he was seated with supervision. UB bathing completed w/c level at sink after, with pt reporting increased fatigue and requesting to lie down. Stand pivot>bed completed with steady assist, and he threaded LEs into underwear without AE. Gripper socks donned by propping L LE up on bed and achieving figure 4 with Rt. This took a lot of energy out of him. OT washed LEs while he was supine in bed with retrograde massage completed to L LE for edema mgt. Talked about keeping L LE elevated and completing leg/foot exercises in bed to help with edema as well. With pt consent, provided him with lavender essential oil (via inhalation) to promote diaphragmatic breathing and relaxation due to tendency to overexert himself. He reported this aromatherapy intervention reminded him of his herb garden at home (he grows lavender). Throughout tx, discussed importance of pacing and utilizing energy conservation techniques during self care, and we took multiple seated rest breaks throughout session. At end of tx pt was left in bed with all needs within reach.   Therapy Documentation Precautions:  Precautions Precautions: Other (comment), Fall Precaution Comments: LVAD Required Braces or Orthoses: Other Brace/Splint Other Brace/Splint: hinged knee brace per pt comfort Restrictions Weight Bearing Restrictions: No Pain: Pain Assessment Pain Scale: 0-10 Pain Score: 3  Pain  Intervention(s): Medication (See eMAR) Multiple Pain Sites: No ADL:      See Function Navigator for Current Functional Status.   Therapy/Group: Individual Therapy  Eagle Pitta A Athens Lebeau 05/11/2018, 11:59 AM

## 2018-05-12 ENCOUNTER — Inpatient Hospital Stay (HOSPITAL_COMMUNITY): Payer: Medicare Other

## 2018-05-12 ENCOUNTER — Inpatient Hospital Stay (HOSPITAL_COMMUNITY): Payer: Medicare Other | Admitting: Physical Therapy

## 2018-05-12 ENCOUNTER — Inpatient Hospital Stay (HOSPITAL_COMMUNITY): Payer: Medicare Other | Admitting: Occupational Therapy

## 2018-05-12 LAB — BASIC METABOLIC PANEL
Anion gap: 5 (ref 5–15)
BUN: 13 mg/dL (ref 8–23)
CHLORIDE: 99 mmol/L (ref 98–111)
CO2: 32 mmol/L (ref 22–32)
CREATININE: 3.78 mg/dL — AB (ref 0.61–1.24)
Calcium: 7.9 mg/dL — ABNORMAL LOW (ref 8.9–10.3)
GFR calc Af Amer: 17 mL/min — ABNORMAL LOW (ref >60)
GFR calc non Af Amer: 15 mL/min — ABNORMAL LOW (ref >60)
Glucose, Bld: 145 mg/dL — ABNORMAL HIGH (ref 70–99)
Potassium: 4.4 mmol/L (ref 3.5–5.1)
Sodium: 136 mmol/L (ref 135–145)

## 2018-05-12 LAB — GLUCOSE, CAPILLARY
GLUCOSE-CAPILLARY: 154 mg/dL — AB (ref 70–99)
Glucose-Capillary: 133 mg/dL — ABNORMAL HIGH (ref 70–99)
Glucose-Capillary: 86 mg/dL (ref 70–99)
Glucose-Capillary: 213 mg/dL — ABNORMAL HIGH (ref 70–99)

## 2018-05-12 LAB — LACTATE DEHYDROGENASE: LDH: 235 U/L — AB (ref 98–192)

## 2018-05-12 LAB — PROTIME-INR
INR: 1.9
Prothrombin Time: 21.7 seconds — ABNORMAL HIGH (ref 11.4–15.2)

## 2018-05-12 LAB — CBC
HEMATOCRIT: 30.9 % — AB (ref 39.0–52.0)
Hemoglobin: 9.1 g/dL — ABNORMAL LOW (ref 13.0–17.0)
MCH: 28.7 pg (ref 26.0–34.0)
MCHC: 29.4 g/dL — ABNORMAL LOW (ref 30.0–36.0)
MCV: 97.5 fL (ref 78.0–100.0)
PLATELETS: 174 10*3/uL (ref 150–400)
RBC: 3.17 MIL/uL — AB (ref 4.22–5.81)
RDW: 19.2 % — ABNORMAL HIGH (ref 11.5–15.5)
WBC: 8.2 10*3/uL (ref 4.0–10.5)

## 2018-05-12 MED ORDER — WARFARIN SODIUM 4 MG PO TABS
4.0000 mg | ORAL_TABLET | Freq: Once | ORAL | Status: AC
  Administered 2018-05-12: 4 mg via ORAL
  Filled 2018-05-12: qty 1

## 2018-05-12 MED ORDER — INSULIN GLARGINE 100 UNIT/ML ~~LOC~~ SOLN
5.0000 [IU] | Freq: Every day | SUBCUTANEOUS | Status: DC
  Administered 2018-05-12: 5 [IU] via SUBCUTANEOUS
  Filled 2018-05-12: qty 0.05

## 2018-05-12 MED ORDER — CHLORHEXIDINE GLUCONATE CLOTH 2 % EX PADS: 6.0000 | MEDICATED_PAD | Freq: Every day | CUTANEOUS | Status: DC

## 2018-05-12 NOTE — Progress Notes (Addendum)
[facility] Progress Note   Subjective:  Seen in room. Eating lunch. Knee improving with therapy   Objective Vitals:   05/11/18 1700 05/11/18 1922 05/11/18 2010 05/12/18 0611  BP: 112/60 (!) 78/60  (!) 87/63  Pulse: 74 74  80  Resp: 18 16  17   Temp: 98.7 F (37.1 C) 99.2 F (37.3 C)  98.3 F (36.8 C)  TempSrc:  Oral  Oral  SpO2: 98% 96% 98% 96%  Weight:    70.3 kg (154 lb 15.7 oz)  Height:       Physical Exam General: Frail elderly male NAD  Heart: LVAD ;mechanical sounds  Lungs: CTAB no rales Abdomen: soft NT  Extremities: L knee bandaged. 2+ pitting RLE edema Dialysis Access: LUE AVF +bruit   Dialysis Orders:  MWFSat [facility] (has been inpatient since May 2019)  4.5hr  83 kg (now down to 73- 74 kg)   2/2 bath  L AVF Hep none   Assessment/Plan: 1. Debility - CIR adm 2. GIB - Hx GI AVMs - s/p multiple procedures -laproscopic enteroscopy at [facility] 6/7/s/p multiple transfusions - Hgb stable. Coumadin resumed  3. Septic L knee - s/p IV antibiotic course I&D x2  4. ICM s/p LVAD - per HF team  5. ESRD - HD MWF extra Sat for volume control; Keep map> 60 on HD for LVAD UF goal 2.5L  6. Volume - some volume on exam. No gross excess. Titrate volume as tolerated. Post HD wt on 7/8 -71kg.  7. Anemia- Continue Aranesp 100 q Monday. Keep Hgb >8.   8. MBD- Ca/Phos ok. No binders. On calcitriol 9. Hx TAVR 10. DM Type 2    Ogechi Larina Earthly PA-C [facility] Pager [phone] 05/12/2018,12:23 PM  LOS: 7 days   Pt seen, examined and agree w A/P as above.  Kelly Splinter MD [facility] pager [phone]   05/12/2018, 12:56 PM    Additional Objective Labs: Basic Metabolic Panel: Recent Labs  Lab 05/09/18 0731 05/10/18 0657 05/11/18 0537 05/12/18 0537  NA 136 136 135 136  K 3.6 3.6 3.7 4.4  CL 97* 97* 97* 99  CO2 31 31 29  32  GLUCOSE 160* 162* 167* 145*  BUN 12 9 19 13   CREATININE 3.61* 3.20* 4.64* 3.78*  CALCIUM 8.0* 8.2*  7.9* 7.9*  PHOS 2.6  --   --   --    CBC: Recent Labs  Lab 05/08/18 0650 05/09/18 0731 05/10/18 0657 05/11/18 0537 05/12/18 0537  WBC 11.4* 9.7 10.4 8.7 8.2  HGB 8.8* 9.6* 9.6* 8.5* 9.1*  HCT 29.2* 32.6* 32.4* 28.3* 30.9*  MCV 96.7 97.6 96.4 95.3 97.5  PLT 170 199 213 162 174   Blood Culture    Component Value Date/Time   SDES SYNOVIAL LEFT KNEE 03/26/2018 1338   SPECREQUEST NONE 03/26/2018 1338   CULT  03/26/2018 1338    NO GROWTH 3 DAYS Performed at [facility], [place] [address] [place], [place] [address]    REPTSTATUS 03/29/2018 FINAL 03/26/2018 1338    Cardiac Enzymes: No results for input(s): CKTOTAL, CKMB, CKMBINDEX, TROPONINI in the last 168 hours. CBG: Recent Labs  Lab 05/11/18 0624 05/11/18 1704 05/11/18 2059 05/12/18 0647 05/12/18 1151  GLUCAP 150* 119* 203* 133* 86   Iron Studies: No results for input(s): IRON, TIBC, TRANSFERRIN, FERRITIN in the last 72 hours. Lab Results  Component Value Date   INR 1.90 05/12/2018   INR 1.82 05/11/2018   INR 1.57 05/10/2018  Medications:  . atorvastatin  40 mg Oral q1800  . budesonide  0.25 mg Nebulization BID  . busPIRone  5 mg Oral BID  . calcitRIOL  0.5 mcg Oral Q M,W,F  . Chlorhexidine Gluconate Cloth  6 each Topical Q0600  . darbepoetin (ARANESP) injection - DIALYSIS  100 mcg Subcutaneous Q Mon-HD  . feeding supplement (NEPRO CARB STEADY)  237 mL Oral BID WC  . hydrocerin   Topical BID  . insulin aspart  0-15 Units Subcutaneous TID WC  . insulin aspart  0-5 Units Subcutaneous QHS  . insulin aspart  3 Units Subcutaneous TID WC  . insulin glargine  5 Units Subcutaneous QHS  . levothyroxine  25 mcg Oral QAC breakfast  . multivitamin  1 tablet Oral QHS  . pantoprazole  40 mg Oral BID  . [START ON 05/31/2018] pantoprazole  40 mg Oral Daily  . senna-docusate  1 tablet Oral BID  . sildenafil  20 mg Oral TID  . traZODone  25 mg Oral QHS  . warfarin  4 mg Oral ONCE-1800  . Warfarin - Pharmacist  Dosing Inpatient   Does not apply [phone]

## 2018-05-12 NOTE — Progress Notes (Signed)
Physical Therapy Session Note  Patient Details  Name: Trevor [name] MRN: [id number] Date of Birth: 1946-07-07  Today's Date: 05/12/2018 PT Individual Time: 0800-0855, 1100-1130 PT Individual Time Calculation (min): 55 min and 30 min    Short Term Goals: Week 1:  PT Short Term Goal 1 (Week 1): =LTGs due to ELOS  Skilled Therapeutic Interventions/Progress Updates:    Pt seated EOB upon PT arrival, agreeable to therapy tx and reports pain 4/10 at L knee, therapist applied ace wraps for edema control total assist. Seated EOB pt switched LVAD power to portable batteries with supervision. Pt performed sit<>stand with RW and supervision, stand pivot to w/c with min guard assist. Pt propelled w/c to sink in order to wash face, brush hair and brush teeth, all with supervision/set up. Pt transported to the gym in w/c. Pt ambulated 2 x 15 ft this session with RW and min guard assist, decreased R LE step length secondary to L knee pain. Pt seated edge of mat performed LE strengthening exercises including 2 x 10 of the following: LAQ, hip flexion and ankle pumps. Pt transported back to room and performed stand pivot to bed with min guard assist and RW. Pt performed LVAD management to switch to wall power with supervision. Pt left seated EOB in care of RN at end of session, bed alarm set.   Session 2:  Pt seated in w/c upon PT arrival, agreeable to therapy tx and reports L LE pain 6/10. Pt transported to ortho gym, pt performed car transfer with RW and min assist, pt performed w/c parts management with verbal cueing. While turning to sit in car pt hit door frame, therapist educated pt on keeping head up to look at surroundings rather than looking down at feet. Pt transported back to room in w/c. Pt performed stand pivot with RW and min assist, verbal cues for techniques.  Pt performed LVAD management to switch to wall power with supervision/set up. Pt left seated EOB, bed alarm set.    Therapy  Documentation Precautions:  Precautions Precautions: Other (comment), Fall Precaution Comments: LVAD Required Braces or Orthoses: Other Brace/Splint Other Brace/Splint: hinged knee brace per pt comfort Restrictions Weight Bearing Restrictions: No   See Function Navigator for Current Functional Status.   Therapy/Group: Individual Therapy  Netta Corrigan, PT, DPT 05/12/2018, 7:48 AM

## 2018-05-12 NOTE — Progress Notes (Signed)
Physical Therapy Session Note  Patient Details  Name: Trevor [name] MRN: [id number] Date of Birth: 05/04/1946  Today's Date: 05/12/2018 PT Individual Time: 1400-1500 PT Individual Time Calculation (min): 60 min   Short Term Goals: Week 1:  PT Short Term Goal 1 (Week 1): =LTGs due to ELOS  Skilled Therapeutic Interventions/Progress Updates:   Pt sitting EOB and agreeable to therapy, reports L knee pain 6/10. Session focused on functional mobility including preparing for d/c to home and education on functional mobility around the home. Ambulated 20' w/ RW and supervision, limited by endurance deficits more so than pain this session. Discussed distance pt would need to get to/from car when going out of the house for dialysis. He estimates about 25'. Educated pt on energy expenditure that would be required w/ leaving house to go to/from dialysis and heart clinic, even if his sister drives him. Discussed use of transport chair for longer distances, pt agreeable and open to this. Pt self-propelled w/c to/from therapy gym to work on endurance and UE strengthening. Worked on standing tolerance in gym w/o UE support to simulate home set-up at sink or counter space. Able to tolerate 60-90 sec of standing w/o UE support before increase in fatigue and increase in dizziness. Dizziness brought on by looking up to reach an object, no LOB and pt able to sit safely when necessary. Dizziness and fatigue resolves w/ rest. Discussed keeping chairs at various places around the home for when pt is fatigued w/ household mobility. Returned to room and ended session in supine, call bell within reach and all needs met.   Therapy Documentation Precautions:  Precautions Precautions: Other (comment), Fall Precaution Comments: LVAD Required Braces or Orthoses: Other Brace/Splint Other Brace/Splint: hinged knee brace per pt comfort Restrictions Weight Bearing Restrictions: No  See Function Navigator for Current  Functional Status.   Therapy/Group: Individual Therapy  Benard Minturn K Arnette 05/12/2018, 3:10 PM

## 2018-05-12 NOTE — Progress Notes (Addendum)
Patient ID: Trevor [name], male   DOB: July 09, 1946, 72 y.o.   MRN: [id number]   Advanced Heart Failure VAD Team Note  Subjective:    L knee pain better controlled.   LVAD Interrogation HM 2: Speed: 9200 Flow: 4.5  PI: 5.6   Power: 4 - VAD interrogated personally.   Objective:    Vital Signs:   Temp:  [98.3 F (36.8 C)-99.2 F (37.3 C)] 98.3 F (36.8 C) (07/09 0611) Pulse Rate:  [68-80] 80 (07/09 0611) Resp:  [16-18] 17 (07/09 0611) BP: (78-113)/(50-85) 87/63 (07/09 0611) SpO2:  [96 %-98 %] 96 % (07/09 0611) Weight:  [154 lb 15.7 oz (70.3 kg)-160 lb 15 oz (73 kg)] 154 lb 15.7 oz (70.3 kg) (07/09 0611) Last BM Date: 05/11/18 Mean arterial Pressure 70s   Intake/Output:   Intake/Output Summary (Last 24 hours) at 05/12/2018 0932 Last data filed at 05/11/2018 1700 Gross per 24 hour  Intake 240 ml  Output 2000 ml  Net -1760 ml     Physical Exam    Physical Exam: Physical Exam: GENERAL: Sitting on the side of the bed.  HEENT: normal  NECK: Supple, JVP 8-9   .  2+ bilaterally, no bruits.  No lymphadenopathy or thyromegaly appreciated.   CARDIAC:  Mechanical heart sounds with LVAD hum present.  LUNGS:  Clear to auscultation bilaterally.  ABDOMEN:  Soft, round, nontender, positive bowel sounds x4.     LVAD exit site: well-healed and incorporated.  Dressing dry and intact.  No erythema or drainage.  Stabilization device present and accurately applied.  Driveline dressing is being changed daily per sterile technique. EXTREMITIES:  Warm and dry, no cyanosis, clubbing, rash or edema . LLE ace wrap.  NEUROLOGIC:  Alert and oriented x 4. No aphasia.  No dysarthria.  Affect pleasant.       Telemetry   N/A  EKG    No new tracings.    Labs   Basic Metabolic Panel: Recent Labs  Lab 05/08/18 0650 05/09/18 0731 05/10/18 0657 05/11/18 0537 05/12/18 0537  NA 133* 136 136 135 136  K 3.7 3.6 3.6 3.7 4.4  CL 95* 97* 97* 97* 99  CO2 30 31 31 29  32  GLUCOSE 201* 160* 162* 167*  145*  BUN 21 12 9 19 13   CREATININE 4.42* 3.61* 3.20* 4.64* 3.78*  CALCIUM 7.7* 8.0* 8.2* 7.9* 7.9*  PHOS  --  2.6  --   --   --     Liver Function Tests: No results for input(s): AST, ALT, ALKPHOS, BILITOT, PROT, ALBUMIN in the last 168 hours. No results for input(s): LIPASE, AMYLASE in the last 168 hours. No results for input(s): AMMONIA in the last 168 hours.  CBC: Recent Labs  Lab 05/08/18 0650 05/09/18 0731 05/10/18 0657 05/11/18 0537 05/12/18 0537  WBC 11.4* 9.7 10.4 8.7 8.2  HGB 8.8* 9.6* 9.6* 8.5* 9.1*  HCT 29.2* 32.6* 32.4* 28.3* 30.9*  MCV 96.7 97.6 96.4 95.3 97.5  PLT 170 199 213 162 174    INR: Recent Labs  Lab 05/08/18 0650 05/09/18 0731 05/10/18 0657 05/11/18 0537 05/12/18 0537  INR 1.97 1.95 1.57 1.82 1.90    Other results:  EKG:    Imaging   No results found.   Medications:     Scheduled Medications: . atorvastatin  40 mg Oral q1800  . budesonide  0.25 mg Nebulization BID  . busPIRone  5 mg Oral BID  . calcitRIOL  0.5 mcg Oral Q M,W,F  .  Chlorhexidine Gluconate Cloth  6 each Topical Q0600  . darbepoetin (ARANESP) injection - DIALYSIS  100 mcg Subcutaneous Q Mon-HD  . feeding supplement (NEPRO CARB STEADY)  237 mL Oral BID WC  . hydrocerin   Topical BID  . insulin aspart  0-15 Units Subcutaneous TID WC  . insulin aspart  0-5 Units Subcutaneous QHS  . insulin aspart  3 Units Subcutaneous TID WC  . insulin glargine  5 Units Subcutaneous QHS  . levothyroxine  25 mcg Oral QAC breakfast  . multivitamin  1 tablet Oral QHS  . pantoprazole  40 mg Oral BID  . [START ON 05/31/2018] pantoprazole  40 mg Oral Daily  . senna-docusate  1 tablet Oral BID  . sildenafil  20 mg Oral TID  . traZODone  25 mg Oral QHS  . Warfarin - Pharmacist Dosing Inpatient   Does not apply q1800    Infusions:   PRN Medications: acetaminophen, albuterol, aluminum hydroxide, benzonatate, bisacodyl, diphenhydrAMINE, guaiFENesin-dextromethorphan, lidocaine (PF),  lidocaine-prilocaine, ondansetron (ZOFRAN) IV, oxyCODONE, polyethylene glycol, traMADol   Patient Profile   Bentlee Benningfield Murphyis a 72 y.o.malewith a history of CAD s/p CABG x 4 2010, OSA, AS with TAVR 2015, ,ESRD on HD,DM2,chronicatrial fibrillation, asthma,GI bleed,and ischemic cardiomyopathy with Medtronic ICD.S/PHMII LVAD for Destination therapy on 12/14/2015.  Transferred back from [place] on 6/27 after undergoing enterotomy and clipping of a colonic Dueilafoy lesion as well I&D of septic left knee.  Assessment/Plan:    1. GI Bleed/Symptomatic Anemia:GI AVMs. Double balloon retro endoscopy at [facility], unable to identify bleeding lesion. He failed attempted IR embolization x 3 while at [facility]. 12 units PRBCs at [facility]. Received 15 uPRBC while at [facility]. IR consulted, but did not find any active bleeding on CTA 5/20. Repeat capsule study showed active bleeding in the mid ileum.Surgery was reconsulted and recommended transfer back to [facility] for possible surgery.Now s/p laparoscopic surgical enteroscopy with 4 clips on 6/7 at [facility], c/b ischemic bowel/SBO. Now tolerating full diet if he eats in small quantities. Normal BM yesterday, no evidence for overt bleeding.  - Tolerating warfarin. INR 1.9  Goal INR 1.8-2.3, would not bridge with warfarin.  No ASA.  - Hgb stable 9.1   - Getting Aranesp - He will continue octreotide as outpatient.  2. 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 S/pHM II LVAD placed 2029for DT.Has Medtronic ICD. Volume controlled by HD. MWF/Sat schedule.  VAD interrogated personally. Parameters stable.  -Continue sildenafil 20 mg tid. - VAD interrogated personally. Parameters stable. - LDH stable.  4. ESRD- M/W/F/Sa - HD per Renal. Continue MWF/Sa schedule.  5. Chronic A NIO:EVOJ controlled.No change.  6. HTN:MAP stable.  7.Septicleftknee: s/pseveral aspirations and OR wash out x2. Aspirate cultures positive for staph epi and clostridium  perfingens. Blood cultures negative at [facility]. Finished Ancef 7/2.  Ortho saw 6/28  Stitches removed 05/05/18 -WBC 8.2 .   8. DM II -SSI. No change. 9.Hx ofCAD s/pCABG. - Continue statin 10. Hx ofTAVR:Valvestable on last echo2/2019. No change.  11. Deconditioning - Appreciate CIR care - No change to current plan.   12. Insomnia: Improved with limiting interruptions in night - Trazadone has for him worked in the past. Continue PRN.  - No change to current plan.    I reviewed the LVAD parameters from today, and compared the results to the patient's prior recorded data.  No programming changes were made.  The LVAD is functioning within specified parameters.  The patient performs LVAD self-test daily.  LVAD interrogation was  negative for any significant power changes, alarms or PI events/speed drops.  LVAD equipment check completed and is in good working order.  Back-up equipment present.   LVAD education done on emergency procedures and precautions and reviewed exit site care.   Length of Stay: Osborn, NP 05/12/2018, 9:32 AM  VAD Team --- VAD ISSUES ONLY--- Pager [phone] (7am - 7am)  Advanced Heart Failure Team  Pager [phone] (M-F; 7a - 4p)  Please contact [facility] for night-coverage after hours (4p -7a ) and weekends on [email or web address]  Patient seen with NP, agree with the above note.  LVAD parameters stable.  Working with PT, knee is better.  Hgb higher at 9.1, no overt bleeding.  INR at goal.  Continue PT as per CIR.   Loralie Champagne 05/12/2018 9:45 AM

## 2018-05-12 NOTE — Progress Notes (Signed)
[facility] PHYSICAL MEDICINE & REHABILITATION     PROGRESS NOTE  Subjective/Complaints:  No new complaints. Left knee still sore. Knee brace helps. Perhaps a little less swollen today  ROS: Patient denies fever, rash, sore throat, blurred vision, nausea, vomiting, diarrhea, cough, shortness of breath or chest pain,   headache, or mood change.     Objective: Vital Signs: Blood pressure (!) 87/63, pulse 80, temperature 98.3 F (36.8 C), temperature source Oral, resp. rate 17, height 5\' 5"  (1.651 m), weight 70.3 kg (154 lb 15.7 oz), SpO2 96 %. No results found. Recent Labs    05/11/18 0537 05/12/18 0537  WBC 8.7 8.2  HGB 8.5* 9.1*  HCT 28.3* 30.9*  PLT 162 174   Recent Labs    05/11/18 0537 05/12/18 0537  NA 135 136  K 3.7 4.4  CL 97* 99  GLUCOSE 167* 145*  BUN 19 13  CREATININE 4.64* 3.78*  CALCIUM 7.9* 7.9*   CBG (last 3)  Recent Labs    05/11/18 1704 05/11/18 2059 05/12/18 0647  GLUCAP 119* 203* 133*    Wt Readings from Last 3 Encounters:  05/12/18 70.3 kg (154 lb 15.7 oz)  05/05/18 74.2 kg (163 lb 9.3 oz)  04/01/18 90.6 kg (199 lb 11.8 oz)    Physical Exam:  BP (!) 87/63 (BP Location: Right Arm)   Pulse 80   Temp 98.3 F (36.8 C) (Oral)   Resp 17   Ht 5\' 5"  (1.651 m)   Wt 70.3 kg (154 lb 15.7 oz)   SpO2 96%   BMI 25.79 kg/m  Constitutional: No distress . Vital signs reviewed. HEENT: EOMI, oral membranes moist Neck: supple Cardiovascular: hum    Respiratory: CTA Bilaterally without wheezes or rales. Normal effort    GI: BS +, non-tender, non-distended  Musculoskeletal: B/l LE edema L>R. Mild TTP left knee--limited knee extensoin, tight hamstrings. Knee less swollen today, 2+ pedal edema Neurological: He is alert.  Motor: B/l UE: 4+/5 proximal to distal LLE: 2-3/5 proximal to distal. left knee limited by pain RLE: HF 3/5,   ADF 4/5 (stable) Skin:  Left knee with erythema surrounding incision   No drainage.  BLE with dry flaky skin.  Vascular  changes b/l LE Psychiatric: He has a normal mood and affect. His behavior is normal.   Assessment/Plan: 1. Functional deficits secondary to debility which require 3+ hours per day of interdisciplinary therapy in a comprehensive inpatient rehab setting. Physiatrist is providing close team supervision and 24 hour management of active medical problems listed below. Physiatrist and rehab team continue to assess barriers to discharge/monitor patient progress toward functional and medical goals.  Function:  Bathing Bathing position Bathing activity did not occur: Refused Position: Wheelchair/chair at sink  Bathing parts Body parts bathed by patient: Right arm, Left arm, Chest, Abdomen, Right upper leg, Left upper leg Body parts bathed by helper: Right lower leg, Left lower leg, Back  Bathing assist        Upper Body Dressing/Undressing Upper body dressing   What is the patient wearing?: Hospital gown                Upper body assist Assist Level: Set up   Set up : To obtain clothing/put away  Lower Body Dressing/Undressing Lower body dressing Lower body dressing/undressing activity did not occur: Refused What is the patient wearing?: Underwear, Non-skid slipper socks Underwear - Performed by patient: Thread/unthread right underwear leg, Thread/unthread left underwear leg Underwear - Performed by helper: Pull  underwear up/down Pants- Performed by patient: Pull pants up/down Pants- Performed by helper: Thread/unthread right pants leg, Thread/unthread left pants leg Non-skid slipper socks- Performed by patient: Don/doff right sock, Don/doff left sock Non-skid slipper socks- Performed by helper: Don/doff right sock, Don/doff left sock                  Lower body assist Assist for lower body dressing: (total/substantial assist)      Toileting Toileting   Toileting steps completed by patient: Performs perineal hygiene, Adjust clothing prior to toileting(No clothing to adjust  post toileting) Toileting steps completed by helper: Performs perineal hygiene Toileting Assistive Devices: Grab bar or rail  Toileting assist Assist level: Touching or steadying assistance (Pt.75%)   Transfers Chair/bed transfer   Chair/bed transfer method: Stand pivot Chair/bed transfer assist level: Touching or steadying assistance (Pt > 75%) Chair/bed transfer assistive device: Walker, Air cabin crew     Max distance: 20 Assist level: Touching or steadying assistance (Pt > 75%)   Wheelchair   Type: Manual Max wheelchair distance: 15 Assist Level: Supervision or verbal cues  Cognition Comprehension Comprehension assist level: Follows complex conversation/direction with extra time/assistive device  Expression Expression assist level: Expresses complex ideas: With extra time/assistive device  Social Interaction Social Interaction assist level: Interacts appropriately with others with medication or extra time (anti-anxiety, antidepressant).  Problem Solving Problem solving assist level: Solves complex problems: With extra time  Memory Memory assist level: Recognizes or recalls 25 - 49% of the time/requires cueing 50 - 75% of the time(hand placement for sit>< stand with RW)    Medical Problem List and Plan: 1.  Deficits with mobility, self-care secondary to LVAD as well as left knee infection/aspiration.  Continue CIR, team conference today.   -needs to reach supervision goals 2.  DVT Prophylaxis/Anticoagulation: Pharmaceutical: Coumadin. Being followed closely by pharmacy.   INR 1.9  7/9 3. Pain Management: continue oxycodone and/or ultram for pain. Monitor for signs of overdose as HD dependent.  4. Mood: team to provide ego support. LCSW to follow for evaluation and support.  5. Neuropsych: This patient appears capable of making decisions on his own behalf. 6. Skin/Wound Care: routine pressure relief measures.  7. Fluids/Electrolytes/Nutrition: Renal diet  --decrease fluid restriction to 1200.Marland Kitchen  Monitor weights daily. Strict I/O.  8. GIB due to AVMs with ABLA: On Protonix BID. Continue to monitor stools for melena/signs of bleeding. Augment bowel program as needed  Hemoglobin 9.1 on 7/9  Continue to monitor 9. CAD s/p CABG/ICM--s/p LVAD: Strict I/O with daily weights. LVAD self test daily. Monitor LVAD per protocol Filed Weights   05/11/18 1330 05/11/18 1630 05/12/18 0611  Weight: 73 kg (160 lb 15 oz) 71 kg (156 lb 8.4 oz) 70.3 kg (154 lb 15.7 oz)   -holding around 70kg,   -volume mgt per cards 10 T2DM:  Pt on lantus 8u qhs at home  -add lantus 5u qhs starting tonight  - Continue scheduled novolog 3 units with meals for now and moderate SSI.    -may need am lantus as well to better control PM #s 11. Septic Knee: Has completed antibiotic course on 05/04/18  -  utilize knee brace for stability/pain control  -scheduled ice for knee 12. ESRD: HD MWFSa to help with fluid overload/anasarca.  13. Sleep disturbance: Scheduled trazodone as at home.   14. Anxiety disorder: continue Buspar tid.  15. Leukocytosis  WBCs 8.2 7/9  Afebrile  Continue to monitor 16. Hypotension  Asymptomatic at present  LOS (Days) 7 A FACE TO FACE EVALUATION WAS PERFORMED  Meredith Staggers 05/12/2018 8:51 AM

## 2018-05-12 NOTE — Progress Notes (Signed)
LVAD Coordinator Rounding Note:  HM II LVAD implanted on 12/13/16 by Dr. Darcey Nora under Destination Therapy criteria due to age excluding heart transplantation.  Transferred from [facility] on 04/30/18 following laparoscopic surgical enteroscopy on 6/7 and received 4 clips. He received 13 uPRBCs but had no further bleeding post-op. Last octreotide injection 6/13. Danazol was DC'd. ASA and coumadin were not resumed. VAD parameters were stable.   Course complicated by AMS and concern for ischemic bowel. Improved with NGT decompression and bowel rest. Head CT negative. Blood cultures and UA were negative. Tolerated clears and thought stable for diet advancement.    Additionally, required multiple left knee aspirations and OR washouts. Aspirate cultures positive for staph epi and clostridium perfingen. Started on Vanc and narrowed to Ancef.   Pt doing well. States that he is mainly limited by knee pain for his mobility. He states that he is ready to go home.  Vital signs: Temp:  98.3 HR: 80 Automatic BP:  87/63(71) Doppler: 72 O2 Sat: 96% RA Wt:174>166>160>154 lbs  LVAD interrogation reveals:  Speed:  9200 Flow:  4.6 Power: 5.2 w PI: 5.2 Alarms: none Events:  Fixed speed: 9200 Low speed limit: 8600   Drive Line:  Existing VAD dressing removed and site care performed using sterile technique. Drive line exit site cleaned with saline x 2, allowed to dry, and Sorbaview dressing without bio patch re-applied. Exit site healed and incorporated, the velour is fully implanted at exit site. No redness, tenderness, drainage, foul odor or rash noted. Drive line anchor re-applied. Pt denies fever or chills. Next dressing change 05/19/18.  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 Margins not clear at neck. - 02/23/18> hospitalization for GI bleed. Colon/EGD 02/27/18 with normal EGD. Colonoscopy identified active bleeding in ileum; source not found. Capsule Endoscopy 02/27/18: Showed active bleeding in the ileum. Received 5 units blood with histamine reaction; will give benadryl as OP. - 03/10/18>transferrred to [facility] for double retro balloon scop and sent to IR emolization x 3 with no evidence of bleeding. Received 12 units PCs, remained off anticoagulants. Transferred back to [facility] 03/20/18 Seen by IR 5/20. CTA obtained, but no active bleeding found. Started on octreotide. 03/25/18: Repeat capsule endoscopy completed. Bleeding appears to come from the mid-ileum. 03/26/18: General surgery reconsulted. Recommended transfer back to [facility] for surgery with intraoperative endoscopy. [name] currently discussing options. Ortho consulted 5/24 for left knee pain. S/p knee aspiration and steroid injection.  Dose of premarin given 5/24.  On 5/25 developed abdominal bloating and distension with n/v. KUB suggestive of ileus versus early SBO. NGT placed for decompression.  Had large bloody BM after this and felt much better, diet now advanced to regular.  - 04/01/18 transferred to [facility] for  laparoscopic surgical enteroscopy for recurrent GI bleed   Plan/Recommendations: 1.  Weekly dressing changes per VAD coordinator. Next dressing change due 05/19/18. 2.  Call VAD pager if any questions re: VAD equipment or drive line site  issues.   [name], VAD Coordinator 24/7 VAD pager: [phone]

## 2018-05-12 NOTE — Progress Notes (Signed)
ANTICOAGULATION CONSULT NOTE  Pharmacy Consult for warfarin Indication: LVAD   Patient Measurements: Height: 5\' 5"  (165.1 cm) Weight: 154 lb 15.7 oz (70.3 kg) IBW/kg (Calculated) : 61.5 Heparin Dosing Weight: 79.5 kg  Vital Signs: Temp: 98.3 F (36.8 C) (07/09 0611) Temp Source: Oral (07/09 0611) BP: 87/63 (07/09 8527) Pulse Rate: 80 (07/09 0611)  Labs: Recent Labs    05/10/18 0657 05/11/18 0537 05/12/18 0537  HGB 9.6* 8.5* 9.1*  HCT 32.4* 28.3* 30.9*  PLT 213 162 174  LABPROT 18.6* 20.9* 21.7*  INR 1.57 1.82 1.90  CREATININE 3.20* 4.64* 3.78*    Medical History: Past Medical History:  Diagnosis Date  . AICD (automatic cardioverter/defibrillator) present   . Asthma   . AVM (arteriovenous malformation) of colon 07/07/2017  . CHF (congestive heart failure) ([place])   . Diabetes ([place])   . ESRF (end stage renal failure) ([place]) 07/07/2017   pt receiving hemodialysis Monday- Wednesday-friday, sometimes Saturday  . Kidney disease   . LVAD (left ventricular assist device) present ([place]) 12/2016  . Permanent atrial fibrillation ([place]) 07/07/2017  . Presence of permanent cardiac pacemaker    AICD  . Sleep apnea     Assessment: 53 yom who underwent HM2 LVAD implantation on 12/14/2015. Has undergoing extensive GI workup to evaluate bleeding. Was stopped on anticoagulants as of admission on 03/09/2018. Last outpatient regimen was (as of 02/17/2018): 7.5 mg daily except 10 mg on Tues/Thurs for goal 2-2.5. Goal INR was decreased to 1.8-2.3 after admission on 02/28/2018. Now s/p laparoscopic surgical enteroscopy with 4 clips on 04/10/2018.  Heparin and warfarin were resumed on 6/27 >> now off heparin infusion. Patient had only received two doses of warfarin prior to INR unexpectedly jumping (peaked at 4.71) previously - have been dosing lower than previous home regimen since.  INR today is 1.9. Hgb 9.1, plt 174. LDH stable at 235. No s/sx of bleeding noted. No new medication interactions. Good    Will need to be very cautious to not overdose given previous trends.   Goal of Therapy:  INR goal: 1.8-2.2 Monitor platelets by anticoagulation protocol: Yes   Plan:  -Warfarin 4 mg PO tonight -Daily INR/CBC  -Monitor s/sx bleeding  Doylene Canard, PharmD Clinical Pharmacist  Pager: [phone] Phone: [phone] 05/12/2018 9:49 AM

## 2018-05-12 NOTE — Progress Notes (Signed)
Occupational Therapy Session Note  Patient Details  Name: Trevor [name] MRN: [id number] Date of Birth: 1946/06/23  Today's Date: 05/12/2018 OT Individual Time: 0623-7628 OT Individual Time Calculation (min): 55 min    Short Term Goals: Week 1:  OT Short Term Goal 1 (Week 1): STG = LTGs due to ELOS  Skilled Therapeutic Interventions/Progress Updates:    Upon entering the room, pt seated on EOB with sister present in the room. Pt agreeable to OT intervention this session. Pt performed sit >stand with min A and use of RW. Pt ambulating 5' with RW and steady assistance to sit into wheelchair. Pt noted to not be bearing any weight through L LE during this session secondary to pain. Pt disconnecting and changing LVAD batteries over with increased time and min verbal guidance cues. OT assisted pt outside via wheelchair and pt propelling wheelchair 75' on uneven surfaces and slopes with supervision - min A. Pt taking seated rest break secondary to fatigue. OT assisted pt back towards room and pt propelling the last 150' towards room with B UEs for strengthening and endurance. Pt needing multiple rest breaks secondary to fatigue and education continuing regarding energy conservation for functional mobility and self care. Pt remained seated in wheelchair with call bell and all needed items within reach upon exiting the room.   Therapy Documentation Precautions:  Precautions Precautions: Other (comment), Fall Precaution Comments: LVAD Required Braces or Orthoses: Other Brace/Splint Other Brace/Splint: hinged knee brace per pt comfort Restrictions Weight Bearing Restrictions: No  See Function Navigator for Current Functional Status.   Therapy/Group: Individual Therapy  Gypsy Decant 05/12/2018, 10:30 AM

## 2018-05-13 ENCOUNTER — Inpatient Hospital Stay (HOSPITAL_COMMUNITY): Payer: Medicare Other | Admitting: Occupational Therapy

## 2018-05-13 ENCOUNTER — Inpatient Hospital Stay (HOSPITAL_COMMUNITY): Payer: Medicare Other

## 2018-05-13 ENCOUNTER — Inpatient Hospital Stay (HOSPITAL_COMMUNITY): Payer: Medicare Other | Admitting: Physical Therapy

## 2018-05-13 LAB — CBC
HEMATOCRIT: 30.7 % — AB (ref 39.0–52.0)
HEMOGLOBIN: 9.1 g/dL — AB (ref 13.0–17.0)
MCH: 28.7 pg (ref 26.0–34.0)
MCHC: 29.6 g/dL — AB (ref 30.0–36.0)
MCV: 96.8 fL (ref 78.0–100.0)
Platelets: 196 10*3/uL (ref 150–400)
RBC: 3.17 MIL/uL — AB (ref 4.22–5.81)
RDW: 19.1 % — ABNORMAL HIGH (ref 11.5–15.5)
WBC: 9 10*3/uL (ref 4.0–10.5)

## 2018-05-13 LAB — BASIC METABOLIC PANEL
ANION GAP: 11 (ref 5–15)
BUN: 24 mg/dL — ABNORMAL HIGH (ref 8–23)
CO2: 28 mmol/L (ref 22–32)
Calcium: 8.1 mg/dL — ABNORMAL LOW (ref 8.9–10.3)
Chloride: 94 mmol/L — ABNORMAL LOW (ref 98–111)
Creatinine, Ser: 5.23 mg/dL — ABNORMAL HIGH (ref 0.61–1.24)
GFR calc Af Amer: 11 mL/min — ABNORMAL LOW (ref >60)
GFR, EST NON AFRICAN AMERICAN: 10 mL/min — AB (ref >60)
GLUCOSE: 119 mg/dL — AB (ref 70–99)
POTASSIUM: 4.5 mmol/L (ref 3.5–5.1)
Sodium: 133 mmol/L — ABNORMAL LOW (ref 135–145)

## 2018-05-13 LAB — GLUCOSE, CAPILLARY
GLUCOSE-CAPILLARY: 103 mg/dL — AB (ref 70–99)
GLUCOSE-CAPILLARY: 181 mg/dL — AB (ref 70–99)
Glucose-Capillary: 126 mg/dL — ABNORMAL HIGH (ref 70–99)
Glucose-Capillary: 142 mg/dL — ABNORMAL HIGH (ref 70–99)

## 2018-05-13 LAB — PROTIME-INR
INR: 2.29
Prothrombin Time: 25 seconds — ABNORMAL HIGH (ref 11.4–15.2)

## 2018-05-13 LAB — LACTATE DEHYDROGENASE: LDH: 221 U/L — ABNORMAL HIGH (ref 98–192)

## 2018-05-13 MED ORDER — LIDOCAINE-PRILOCAINE 2.5-2.5 % EX CREA
1.0000 "application " | TOPICAL_CREAM | CUTANEOUS | Status: DC | PRN
  Filled 2018-05-13: qty 5

## 2018-05-13 MED ORDER — LIDOCAINE HCL (PF) 1 % IJ SOLN
5.0000 mL | INTRAMUSCULAR | Status: DC | PRN
  Filled 2018-05-13 (×2): qty 5

## 2018-05-13 MED ORDER — PENTAFLUOROPROP-TETRAFLUOROETH EX AERO: 1.0000 "application " | INHALATION_SPRAY | CUTANEOUS | Status: DC | PRN

## 2018-05-13 MED ORDER — SODIUM CHLORIDE 0.9 % IV SOLN: 100.0000 mL | INTRAVENOUS | Status: DC | PRN

## 2018-05-13 MED ORDER — WARFARIN SODIUM 3 MG PO TABS
3.0000 mg | ORAL_TABLET | Freq: Once | ORAL | Status: AC
  Administered 2018-05-13: 3 mg via ORAL
  Filled 2018-05-13: qty 1

## 2018-05-13 MED ORDER — INSULIN GLARGINE 100 UNIT/ML ~~LOC~~ SOLN
8.0000 [IU] | Freq: Every day | SUBCUTANEOUS | Status: DC
  Administered 2018-05-13 – 2018-05-14 (×2): 8 [IU] via SUBCUTANEOUS
  Filled 2018-05-13 (×2): qty 0.08

## 2018-05-13 NOTE — Progress Notes (Signed)
Occupational Therapy Session Note  Patient Details  Name: Trevor [name] MRN: [id number] Date of Birth: [birth year]  Today's Date: 05/13/2018 OT Individual Time: 1100-1156 OT Individual Time Calculation (min): 56 min    Short Term Goals: Week 1:  OT Short Term Goal 1 (Week 1): STG = LTGs due to ELOS  Skilled Therapeutic Interventions/Progress Updates:    Upon entering the room, pt seated in wheelchair awaiting therapist arrival. Pt propelling self 72' with supervision and increased time to [facility] gym. Pt standing for dynavision task to address balance and standing tolerance. Pt standing for 2.5 minutes with RW and close supervision while hitting 61 targets and alternating hands. Pt continues to not bear much weight through L LE. Pt returning to seated position and has c/o increased pain. RN notified for pain medication. Pt propelling self back to room in same manner and switching from batteries to wall source with increased time and set up A to obtain all needed items. Pt needing multiple rest breaks this session secondary to fatigue. Pt transferred from wheelchair >bed with steady assistance and use of RW. RN arrived with medication and pt returning to supine position with min guard for safety. OT assisted pt with repositioning in bed and ice packs places on L knee for pain management. Bed alarm activated and call bell within reach upon exiting the room.  Therapy Documentation Precautions:  Precautions Precautions: Other (comment), Fall Precaution Comments: LVAD Required Braces or Orthoses: Other Brace/Splint Other Brace/Splint: hinged knee brace per pt comfort Restrictions Weight Bearing Restrictions: No  Pain: Pain Assessment Pain Scale: 0-10 Pain Score: 5  Pain Type: Acute pain Pain Location: Knee Pain Orientation: Left Pain Descriptors / Indicators: Aching;Discomfort Pain Frequency: Constant Pain Onset: On-going Patients Stated Pain Goal: 3 Pain Intervention(s): Medication (See  eMAR)  See Function Navigator for Current Functional Status.   Therapy/Group: Individual Therapy  Gypsy Decant 05/13/2018, 12:35 PM

## 2018-05-13 NOTE — Progress Notes (Addendum)
[place] PHYSICAL MEDICINE & REHABILITATION     PROGRESS NOTE  Subjective/Complaints:  Just finished breakfast. Left knee sore but pain controlled this AM.   ROS: Patient denies fever, rash, sore throat, blurred vision, nausea, vomiting, diarrhea, cough, shortness of breath or chest pain,  back pain, headache, or mood change.   Objective: Vital Signs: Blood pressure (!) 87/63, pulse 60, temperature 98 F (36.7 C), temperature source Oral, resp. rate 16, height 5\' 5"  (1.651 m), weight 70.2 kg (154 lb 12.2 oz), SpO2 97 %. No results found. Recent Labs    05/12/18 0537 05/13/18 0615  WBC 8.2 9.0  HGB 9.1* 9.1*  HCT 30.9* 30.7*  PLT 174 196   Recent Labs    05/12/18 0537 05/13/18 0615  NA 136 133*  K 4.4 4.5  CL 99 94*  GLUCOSE 145* 119*  BUN 13 24*  CREATININE 3.78* 5.23*  CALCIUM 7.9* 8.1*   CBG (last 3)  Recent Labs    05/12/18 1650 05/12/18 2112 05/13/18 0615  GLUCAP 154* 213* 126*    Wt Readings from Last 3 Encounters:  05/13/18 70.2 kg (154 lb 12.2 oz)  05/05/18 74.2 kg (163 lb 9.3 oz)  04/01/18 90.6 kg (199 lb 11.8 oz)    Physical Exam:  BP (!) 87/63 (BP Location: Right Arm)   Pulse 60   Temp 98 F (36.7 C) (Oral)   Resp 16   Ht 5\' 5"  (1.651 m)   Wt 70.2 kg (154 lb 12.2 oz)   SpO2 97%   BMI 25.75 kg/m  Constitutional: No distress . Vital signs reviewed. HEENT: EOMI, oral membranes moist Neck: supple Cardiovascular: RRR without murmur. No JVD    Respiratory: CTA Bilaterally without wheezes or rales. Normal effort    GI: BS +, non-tender, non-distended  Musculoskeletal: B/l LE edema L>R left knee with effusion,ttp Neurological: He is alert.  Motor: B/l UE: 4+/5 proximal to distal LLE: 2-3/5 proximal to distal. left knee limited by pain RLE: HF 4-/5,   ADF 4/5  Skin:  Left knee with erythema surrounding incision   dry  BLE with dry flaky skin.  Vascular changes b/l LE Psychiatric: He has a normal mood and affect. His behavior is normal.    Assessment/Plan: 1. Functional deficits secondary to debility which require 3+ hours per day of interdisciplinary therapy in a comprehensive inpatient rehab setting. Physiatrist is providing close team supervision and 24 hour management of active medical problems listed below. Physiatrist and rehab team continue to assess barriers to discharge/monitor patient progress toward functional and medical goals.  Function:  Bathing Bathing position Bathing activity did not occur: Refused Position: Wheelchair/chair at sink  Bathing parts Body parts bathed by patient: Right arm, Left arm, Chest, Abdomen, Right upper leg, Left upper leg Body parts bathed by helper: Right lower leg, Left lower leg, Back  Bathing assist        Upper Body Dressing/Undressing Upper body dressing   What is the patient wearing?: Hospital gown                Upper body assist Assist Level: Set up   Set up : To obtain clothing/put away  Lower Body Dressing/Undressing Lower body dressing Lower body dressing/undressing activity did not occur: Refused What is the patient wearing?: Underwear, Non-skid slipper socks Underwear - Performed by patient: Thread/unthread right underwear leg, Thread/unthread left underwear leg Underwear - Performed by helper: Pull underwear up/down Pants- Performed by patient: Pull pants up/down Pants- Performed by helper:  Thread/unthread right pants leg, Thread/unthread left pants leg Non-skid slipper socks- Performed by patient: Don/doff right sock, Don/doff left sock Non-skid slipper socks- Performed by helper: Don/doff right sock, Don/doff left sock                  Lower body assist Assist for lower body dressing: (total/substantial assist)      Toileting Toileting   Toileting steps completed by patient: Performs perineal hygiene, Adjust clothing prior to toileting(No clothing to adjust post toileting) Toileting steps completed by helper: Performs perineal  hygiene Toileting Assistive Devices: Grab bar or rail  Toileting assist Assist level: Touching or steadying assistance (Pt.75%)   Transfers Chair/bed transfer   Chair/bed transfer method: Stand pivot Chair/bed transfer assist level: Supervision or verbal cues Chair/bed transfer assistive device: Walker, Air cabin crew     Max distance: 20' Assist level: Supervision or verbal cues   Wheelchair   Type: Manual Max wheelchair distance: 150' Assist Level: Supervision or verbal cues  Cognition Comprehension Comprehension assist level: Follows complex conversation/direction with extra time/assistive device  Expression Expression assist level: Expresses complex ideas: With extra time/assistive device  Social Interaction Social Interaction assist level: Interacts appropriately with others with medication or extra time (anti-anxiety, antidepressant).  Problem Solving Problem solving assist level: Solves complex problems: With extra time  Memory Memory assist level: Recognizes or recalls 25 - 49% of the time/requires cueing 50 - 75% of the time    Medical Problem List and Plan: 1.  Deficits with mobility, self-care secondary to LVAD as well as left knee infection/aspiration.  Continue CIR, team conference today.   -needs to reach supervision goals 2.  DVT Prophylaxis/Anticoagulation: Pharmaceutical: Coumadin. Being followed closely by pharmacy.   INR 2.29 7/10 3. Pain Management: continue oxycodone and/or ultram for pain. Monitor for signs of overdose as HD dependent.  4. Mood: team to provide ego support. LCSW to follow for evaluation and support.  5. Neuropsych: This patient appears capable of making decisions on his own behalf. 6. Skin/Wound Care: routine pressure relief measures.  7. Fluids/Electrolytes/Nutrition: Renal diet --decrease fluid restriction to 1200.Marland Kitchen  Monitor weights daily. Strict I/O.  8. GIB due to AVMs with ABLA: On Protonix BID. Continue to  monitor stools for melena/signs of bleeding. Augment bowel program as needed  Hemoglobin 9.1 on 7/9  Continue to monitor 9. CAD s/p CABG/ICM--s/p LVAD: Strict I/O with daily weights. LVAD self test daily. Monitor LVAD per protocol Filed Weights   05/11/18 1630 05/12/18 0611 05/13/18 0503  Weight: 71 kg (156 lb 8.4 oz) 70.3 kg (154 lb 15.7 oz) 70.2 kg (154 lb 12.2 oz)   -holding around 70kg,   -volume mgt per cards 10 T2DM:  Pt on lantus 8u qhs at home  -added lantus 5u qhs  ---increase to 8u tonight  - Continue scheduled novolog 3 units with meals for now and moderate SSI.    -may need am lantus as well to better control PM #s 11. Septic Knee: Has completed antibiotic course on 05/04/18  - I discussed with patient that he needs to utilize knee brace for stability/pain control  -scheduled ice for knee  -pain medication prior to activity 12. ESRD: HD MWFSa to help with fluid overload/anasarca.  13. Sleep disturbance: Scheduled trazodone as at home.   14. Anxiety disorder: continue Buspar tid.  15. Leukocytosis  WBCs 8.2 7/9  Afebrile  Continue to monitor 16. Hypotension  Asymptomatic at present  LOS (Days) 8 A FACE  TO FACE EVALUATION WAS PERFORMED  Meredith Staggers 05/13/2018 8:56 AM

## 2018-05-13 NOTE — Progress Notes (Signed)
1803 - pt c/o "not feeling good"  BP 64/34 MAP 39.  UF off.  Pt stated his blood sugar was "dropping".  Attempted to lower HOB, pt refused, stating he could not breathe w/ HOB lowered.   1810 - CBG 103.  Patient with HOB lowered to 10 degrees.  Pulse ox at 96 on room air.  Color more pale than usual.   1815 - pt placed on O2 @ 2 lpm.   1817 - NS bolus, 200 cc given.   1820 - Doppler BP at 63, pt states he feels slightly better.   1830 - Doppler BP @ 72 1840 -BP 82/67, MAP 65.  Continued improvement in patient BP, and he states he is near baseline.  Remains supine with feet sl elevated.

## 2018-05-13 NOTE — Progress Notes (Signed)
ANTICOAGULATION CONSULT NOTE  Pharmacy Consult for warfarin Indication: LVAD   Patient Measurements: Height: 5\' 5"  (165.1 cm) Weight: 154 lb 12.2 oz (70.2 kg) IBW/kg (Calculated) : 61.5 Heparin Dosing Weight: 79.5 kg  Vital Signs: Temp: 98 F (36.7 C) (07/10 0503) Temp Source: Oral (07/10 0503) Pulse Rate: 60 (07/10 0503)  Labs: Recent Labs    05/11/18 0537 05/12/18 0537 05/13/18 0615  HGB 8.5* 9.1* 9.1*  HCT 28.3* 30.9* 30.7*  PLT 162 174 196  LABPROT 20.9* 21.7* 25.0*  INR 1.82 1.90 2.29  CREATININE 4.64* 3.78* 5.23*    Medical History: Past Medical History:  Diagnosis Date  . AICD (automatic cardioverter/defibrillator) present   . Asthma   . AVM (arteriovenous malformation) of colon 07/07/2017  . CHF (congestive heart failure) ([place])   . Diabetes ([place])   . ESRF (end stage renal failure) ([place]) 07/07/2017   pt receiving hemodialysis Monday- Wednesday-friday, sometimes Saturday  . Kidney disease   . LVAD (left ventricular assist device) present ([place]) 12/2016  . Permanent atrial fibrillation ([place]) 07/07/2017  . Presence of permanent cardiac pacemaker    AICD  . Sleep apnea     Assessment: 66 yom who underwent HM2 LVAD implantation on 12/14/2015. Has undergoing extensive GI workup to evaluate bleeding. Was stopped on anticoagulants as of admission on 03/09/2018. Last outpatient regimen was (as of 02/17/2018): 7.5 mg daily except 10 mg on Tues/Thurs for goal 2-2.5. Goal INR was decreased to 1.8-2.3 after admission on 02/28/2018. Now s/p laparoscopic surgical enteroscopy with 4 clips on 04/10/2018.  Heparin and warfarin were resumed on 6/27 >> now off heparin infusion. Patient had only received two doses of warfarin prior to INR unexpectedly jumping (peaked at 4.71) previously - have been dosing lower than previous home regimen since.  INR today is 2.29 from 1.9. Hgb 9.1, plt 196. LDH stable at 221. No s/sx of bleeding noted. No new medication interactions.   Will need to be  very cautious to not overdose given previous trends.   Goal of Therapy:  INR goal: 1.8-2.2 Monitor platelets by anticoagulation protocol: Yes   Plan:  -Warfarin 3 mg PO tonight -Daily INR/CBC  -Monitor s/sx bleeding  Doylene Canard, PharmD Clinical Pharmacist  Pager: [phone] Phone: [phone] 05/13/2018 9:15 AM

## 2018-05-13 NOTE — Progress Notes (Signed)
Physical Therapy Session Note  Patient Details  Name: [name] MRN: [id number] Date of Birth: 1945-11-23  Today's Date: 05/13/2018 PT Individual Time: 0800-0900 PT Individual Time Calculation (min): 60 min   Short Term Goals: Week 1:  PT Short Term Goal 1 (Week 1): =LTGs due to ELOS  Skilled Therapeutic Interventions/Progress Updates:    Pt seated EOB upon PT arrival, agreeable to therapy tx and reports L knee pain 6/10. Therapist applied ace wraps for edema control and L knee brace for comfort. Pt ambulated from EOB into bathroom x 32ft using RW and min guard assist, pt continent of bowel and bladder. Pt maintained standing balance with RW and supervision to perform clothing management and LVAD management. Pt ambulated back to room x10 ft with RW and min guard. Pt seated in w/c at the sink washed face, brushed teeth and hair with supervision. Pt performed seated heel slides and LAQ, 2 x 10 of each with L LE for strengthening. Therapist performed manual L knee flexion and extension stretches for ROM, 3 x 30 sec each. Pt performed stand pivot transfer from w/c back to bed with RW and min guard assist. Pt left seated EOB with bed alarm set and needs in reach.   Therapy Documentation Precautions:  Precautions Precautions: Other (comment), Fall Precaution Comments: LVAD Required Braces or Orthoses: Other Brace/Splint Other Brace/Splint: hinged knee brace per pt comfort Restrictions Weight Bearing Restrictions: No   See Function Navigator for Current Functional Status.   Therapy/Group: Individual Therapy  Netta Corrigan, PT, DPT 05/13/2018, 7:48 AM

## 2018-05-13 NOTE — Progress Notes (Addendum)
Patient ID: Trevor [name], male   DOB: 1945-11-30, 72 y.o.   MRN: [id number]   Advanced Heart Failure VAD Team Note  Subjective:    Denies SOB. Still having knee pain.   LVAD Interrogation HM 2: Speed: 9200 Flow: 4.8  PI: 6.1    Power: 5 - VAD interrogated personally.   Objective:    Vital Signs:   Temp:  [98 F (36.7 C)-98.4 F (36.9 C)] 98 F (36.7 C) (07/10 0503) Pulse Rate:  [60-76] 60 (07/10 0503) Resp:  [16-18] 16 (07/10 0503) SpO2:  [96 %-99 %] 97 % (07/10 0829) Weight:  [154 lb 12.2 oz (70.2 kg)] 154 lb 12.2 oz (70.2 kg) (07/10 0503) Last BM Date: 05/12/18 Mean arterial Pressure 80s   Intake/Output:   Intake/Output Summary (Last 24 hours) at 05/13/2018 0929 Last data filed at 05/12/2018 1300 Gross per 24 hour  Intake 240 ml  Output -  Net 240 ml     Physical Exam    Physical Exam: GENERAL: NAD. HEENT: normal  NECK: Supple, JVP  ~10 .  2+ bilaterally, no bruits.  No lymphadenopathy or thyromegaly appreciated.   CARDIAC:  Mechanical heart sounds with LVAD hum present.  LUNGS:  R and LLL crackles.  ABDOMEN:  Soft, round, nontender, positive bowel sounds x4.     LVAD exit site:  Dressing dry and intact.  No erythema or drainage.  Stabilization device present and accurately applied.  Driveline dressing is being changed daily per sterile technique. EXTREMITIES:  Warm and dry, no cyanosis, clubbing, rash or edema. LLE knee brace   NEUROLOGIC:  Alert and oriented x 4. No aphasia.  No dysarthria.  Affect pleasant.        Telemetry   N/A  EKG    No new tracings.    Labs   Basic Metabolic Panel: Recent Labs  Lab 05/09/18 0731 05/10/18 0657 05/11/18 0537 05/12/18 0537 05/13/18 0615  NA 136 136 135 136 133*  K 3.6 3.6 3.7 4.4 4.5  CL 97* 97* 97* 99 94*  CO2 31 31 29  32 28  GLUCOSE 160* 162* 167* 145* 119*  BUN 12 9 19 13  24*  CREATININE 3.61* 3.20* 4.64* 3.78* 5.23*  CALCIUM 8.0* 8.2* 7.9* 7.9* 8.1*  PHOS 2.6  --   --   --   --     Liver  Function Tests: No results for input(s): AST, ALT, ALKPHOS, BILITOT, PROT, ALBUMIN in the last 168 hours. No results for input(s): LIPASE, AMYLASE in the last 168 hours. No results for input(s): AMMONIA in the last 168 hours.  CBC: Recent Labs  Lab 05/09/18 0731 05/10/18 0657 05/11/18 0537 05/12/18 0537 05/13/18 0615  WBC 9.7 10.4 8.7 8.2 9.0  HGB 9.6* 9.6* 8.5* 9.1* 9.1*  HCT 32.6* 32.4* 28.3* 30.9* 30.7*  MCV 97.6 96.4 95.3 97.5 96.8  PLT 199 213 162 174 196    INR: Recent Labs  Lab 05/09/18 0731 05/10/18 0657 05/11/18 0537 05/12/18 0537 05/13/18 0615  INR 1.95 1.57 1.82 1.90 2.29    Other results:  EKG:    Imaging   No results found.   Medications:     Scheduled Medications: . atorvastatin  40 mg Oral q1800  . budesonide  0.25 mg Nebulization BID  . busPIRone  5 mg Oral BID  . calcitRIOL  0.5 mcg Oral Q M,W,F  . Chlorhexidine Gluconate Cloth  6 each Topical Q0600  . darbepoetin (ARANESP) injection - DIALYSIS  100 mcg Subcutaneous Q Mon-HD  .  feeding supplement (NEPRO CARB STEADY)  237 mL Oral BID WC  . hydrocerin   Topical BID  . insulin aspart  0-15 Units Subcutaneous TID WC  . insulin aspart  0-5 Units Subcutaneous QHS  . insulin aspart  3 Units Subcutaneous TID WC  . insulin glargine  8 Units Subcutaneous QHS  . levothyroxine  25 mcg Oral QAC breakfast  . multivitamin  1 tablet Oral QHS  . pantoprazole  40 mg Oral BID  . [START ON 05/31/2018] pantoprazole  40 mg Oral Daily  . senna-docusate  1 tablet Oral BID  . sildenafil  20 mg Oral TID  . traZODone  25 mg Oral QHS  . Warfarin - Pharmacist Dosing Inpatient   Does not apply q1800    Infusions:   PRN Medications: acetaminophen, albuterol, aluminum hydroxide, benzonatate, bisacodyl, diphenhydrAMINE, guaiFENesin-dextromethorphan, ondansetron (ZOFRAN) IV, oxyCODONE, polyethylene glycol, traMADol   Patient Profile   Trevor Gulyas Murphyis a 72 y.o.malewith a history of CAD s/p CABG x 4 2010,  OSA, AS with TAVR 2015, ,ESRD on HD,DM2,chronicatrial fibrillation, asthma,GI bleed,and ischemic cardiomyopathy with Medtronic ICD.S/PHMII LVAD for Destination therapy on 12/14/2015.  Transferred back from [place] on 6/27 after undergoing enterotomy and clipping of a colonic Dueilafoy lesion as well I&D of septic left knee.  Assessment/Plan:    1. GI Bleed/Symptomatic Anemia:GI AVMs. Double balloon retro endoscopy at [facility], unable to identify bleeding lesion. He failed attempted IR embolization x 3 while at [facility]. 12 units PRBCs at [facility]. Received 15 uPRBC while at [facility] IR consulted, but did not find any active bleeding on CTA 5/20. Repeat capsule study showed active bleeding in the mid ileum.Surgery was reconsulted and recommended transfer back to [facility] for possible surgery.Now s/p laparoscopic surgical enteroscopy with 4 clips on 6/7 at [facility], c/b ischemic bowel/SBO. Now tolerating full diet if he eats in small quantities. Normal BM yesterday, no evidence for overt bleeding.  - Tolerating warfarin. INR 2.3   Goal INR 1.8-2.3, would not bridge with warfarin.  No ASA.  - hgb 9.1   - Getting Aranesp - He will continue octreotide as outpatient.  2. 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 S/pHM II LVAD placed 2091for DT.Has Medtronic ICD. Volume controlled by HD. MWF/Sat schedule.  VAD interrogated personally. Parameters stable.   -Continue sildenafil 20 mg tid. - VAD interrogated personally. Parameters stable. - LDH stable.  4. ESRD- M/W/F/Sa  - HD per Renal. Continue MWF/Sa schedule.  5. Chronic A VQQ:VZDG controlled.No change.  6. HTN: Maps stable.  7.Septicleftknee: s/pseveral aspirations and OR wash out x2. Aspirate cultures positive for staph epi and clostridium perfingens. Blood cultures negative at [facility]. Finished Ancef 7/2.  Ortho saw 6/28  Stitches removed 05/05/18 -WBC 9.   8. DM II -SSI. No change. 9.Hx ofCAD s/pCABG. - Continue statin 10. Hx  ofTAVR:Valvestable on last echo2/2019. No change.  11. Deconditioning - Appreciate CIR care - No change to current plan.   12. Insomnia: Improved with limiting interruptions in night - Trazadone has for him worked in the past. Continue PRN.  - No change to current plan.    I reviewed the LVAD parameters from today, and compared the results to the patient's prior recorded data.  No programming changes were made.  The LVAD is functioning within specified parameters.  The patient performs LVAD self-test daily.  LVAD interrogation was negative for any significant power changes, alarms or PI events/speed drops.  LVAD equipment check completed and is in good working order.  Back-up  equipment present.   LVAD education done on emergency procedures and precautions and reviewed exit site care.   Length of Stay: At[place], NP 05/13/2018, 9:29 AM  VAD Team --- VAD ISSUES ONLY--- Pager [phone] (7am - 7am)  Advanced Heart Failure Team  Pager [phone] (M-F; 7a - 4p)  Please contact [facility] for night-coverage after hours (4p -7a ) and weekends on [email or web address]  Patient seen with NP, agree with the above note.  LVAD parameters stable.  Working with PT, knee is still hurts.  Hgb stable at 9.1, no overt bleeding.  INR at goal (1.8-2.3).  Continue PT as per CIR.   Loralie Champagne 05/13/2018 3:40 PM

## 2018-05-13 NOTE — Progress Notes (Signed)
Physical Therapy Session Note  Patient Details  Name: Trevor [name] MRN: [id number] Date of Birth: [birth year]  Today's Date: 05/13/2018 PT Individual Time: 1000-1100 PT Individual Time Calculation (min): 60 min   Short Term Goals: Week 1:  PT Short Term Goal 1 (Week 1): =LTGs due to ELOS  Skilled Therapeutic Interventions/Progress Updates:   Pt received supine in bed and agreeable to PT. Supine>sit transfer without assist or cues. PT managed LVAD parts with set up only from PT.   Stand pivot transfer to [facility] with supervision assist. Sit<>stand transfers completed x 8 throughout treatment with supervision assist and UE on RW.   WC mobility in control environment x 200 and simulated community of hospital gift shop x177f with distant supervision assist from PT. Min cues for protection of the LLE through simulated community environment.    Gait training in simulated community environment of hospital gift shop x [phone] supervision assist from PT. Min cues for step to gait pattern for safety and pain management.   BLE strengthing therex LAQ, hip flexion, knee flexion, calf raises. 7# ankle weight on the RLE, AROM on the L. All completed x 10 RLE and x 8 LLE. Cues from PT for proper ROM and speed to improve strengthening aspect of movements.   [facility] returned to [facility] and [name]         Therapy Documentation Precautions:  Precautions Precautions: Other (comment), Fall Precaution Comments: LVAD Required Braces or Orthoses: Other Brace/Splint Other Brace/Splint: hinged knee brace per pt comfort Restrictions Weight Bearing Restrictions: No    Vital Signs: Oxygen Therapy SpO2: 97 % O2 Device: Room Air Pain: Pain Assessment Pain Scale: 0-10 Pain Score: 5  Pain Type: Acute pain Pain Location: Knee Pain Orientation: Left Pain Descriptors / Indicators: Aching;Discomfort Pain Frequency: Constant Pain Onset:  On-going Patients Stated Pain Goal: 3 Pain Intervention(s): Medication (See eMAR)   See Function Navigator for Current Functional Status.   Therapy/Group: Individual Therapy  AuLorie Phenix/08/2018, 12:05 PM

## 2018-05-13 NOTE — Progress Notes (Addendum)
[facility] Progress Note   Subjective:  No new complaints.  Some knee pain but improving with therapy  For HD today   Objective Vitals:   05/12/18 2019 05/12/18 2037 05/13/18 0503 05/13/18 0829  BP:      Pulse:  76 60   Resp:  18 16   Temp:  98.4 F (36.9 C) 98 F (36.7 C)   TempSrc:  Oral Oral   SpO2: 99% 96% 98% 97%  Weight:   70.2 kg (154 lb 12.2 oz)   Height:       Physical Exam General: Frail elderly male NAD  Heart: LVAD ;mechanical sounds  Lungs: CTAB no rales Abdomen: soft NT  Extremities: L knee in brace. 2+ pitting RLE edema Dialysis Access: LUE AVF +bruit   Dialysis Orders:  MWFSat [facility] (has been inpatient since May 2019)  4.5hr  83 kg (now down to 73- 74 kg w/o battery)   2/2 bath  L AVF Hep none   Assessment/Plan: 1. Debility - CIR adm 2. GIB - Hx GI AVMs - s/p multiple procedures -laproscopic enteroscopy at [facility] 6/7/s/p multiple transfusions - Hgb stable. Coumadin resumed.  3. Septic L knee - s/p IV antibiotic course I&D x2  4. ICM s/p LVAD - per HF team  5. ESRD - HD MWF extra Sat for volume control; Keep map> 60 on HD for LVAD UF goal 2.5L  6. Volume - some volume on exam. No gross excess. Titrate volume as tolerated. Post HD wt on 7/8 -71kg.  7. Anemia- Continue Aranesp 100 q Monday. Keep Hgb >8.   8. MBD- Ca/Phos ok. No binders. On calcitriol 9. Hx TAVR 10. Chronic Afib  11. DM Type 2    Ogechi Larina Earthly PA-C [facility] Pager [phone] 05/13/2018,9:49 AM  LOS: 8 days    Pt seen, examined and agree w A/P as above.  Kelly Splinter MD [facility] pager [phone]   05/13/2018, 11:34 AM       Additional Objective Labs: Basic Metabolic Panel: Recent Labs  Lab 05/09/18 0731  05/11/18 0537 05/12/18 0537 05/13/18 0615  NA 136   < > 135 136 133*  K 3.6   < > 3.7 4.4 4.5  CL 97*   < > 97* 99 94*  CO2 31   < > 29 32 28  GLUCOSE 160*   < > 167* 145* 119*  BUN 12   < > 19 13 24*   CREATININE 3.61*   < > 4.64* 3.78* 5.23*  CALCIUM 8.0*   < > 7.9* 7.9* 8.1*  PHOS 2.6  --   --   --   --    < > = values in this interval not displayed.   CBC: Recent Labs  Lab 05/09/18 0731 05/10/18 0657 05/11/18 0537 05/12/18 0537 05/13/18 0615  WBC 9.7 10.4 8.7 8.2 9.0  HGB 9.6* 9.6* 8.5* 9.1* 9.1*  HCT 32.6* 32.4* 28.3* 30.9* 30.7*  MCV 97.6 96.4 95.3 97.5 96.8  PLT 199 213 162 174 196   Blood Culture    Component Value Date/Time   SDES SYNOVIAL LEFT KNEE 03/26/2018 1338   SPECREQUEST NONE 03/26/2018 1338   CULT  03/26/2018 1338    NO GROWTH 3 DAYS Performed at [facility], [place] [address], [place], [place] [address]    REPTSTATUS 03/29/2018 FINAL 03/26/2018 1338    Cardiac Enzymes: No results for input(s): CKTOTAL, CKMB, CKMBINDEX, TROPONINI in the last 168 hours. CBG: Recent  Labs  Lab 05/12/18 0647 05/12/18 1151 05/12/18 1650 05/12/18 2112 05/13/18 0615  GLUCAP 133* 86 154* 213* 126*   Iron Studies: No results for input(s): IRON, TIBC, TRANSFERRIN, FERRITIN in the last 72 hours. Lab Results  Component Value Date   INR 2.29 05/13/2018   INR 1.90 05/12/2018   INR 1.82 05/11/2018   Medications:  . atorvastatin  40 mg Oral q1800  . budesonide  0.25 mg Nebulization BID  . busPIRone  5 mg Oral BID  . calcitRIOL  0.5 mcg Oral Q M,W,F  . Chlorhexidine Gluconate Cloth  6 each Topical Q0600  . darbepoetin (ARANESP) injection - DIALYSIS  100 mcg Subcutaneous Q Mon-HD  . feeding supplement (NEPRO CARB STEADY)  237 mL Oral BID WC  . hydrocerin   Topical BID  . insulin aspart  0-15 Units Subcutaneous TID WC  . insulin aspart  0-5 Units Subcutaneous QHS  . insulin aspart  3 Units Subcutaneous TID WC  . insulin glargine  8 Units Subcutaneous QHS  . levothyroxine  25 mcg Oral QAC breakfast  . multivitamin  1 tablet Oral QHS  . pantoprazole  40 mg Oral BID  . [START ON 05/31/2018] pantoprazole  40 mg Oral Daily  . senna-docusate  1 tablet Oral BID  .  sildenafil  20 mg Oral TID  . traZODone  25 mg Oral QHS  . Warfarin - Pharmacist Dosing Inpatient   Does not apply [phone]

## 2018-05-14 ENCOUNTER — Inpatient Hospital Stay (HOSPITAL_COMMUNITY): Payer: Medicare Other | Admitting: Physical Therapy

## 2018-05-14 ENCOUNTER — Inpatient Hospital Stay (HOSPITAL_COMMUNITY): Payer: Medicare Other | Admitting: Occupational Therapy

## 2018-05-14 ENCOUNTER — Inpatient Hospital Stay (HOSPITAL_COMMUNITY): Payer: Medicare Other

## 2018-05-14 LAB — CBC
HEMATOCRIT: 31.8 % — AB (ref 39.0–52.0)
HEMOGLOBIN: 9.7 g/dL — AB (ref 13.0–17.0)
MCH: 29.5 pg (ref 26.0–34.0)
MCHC: 30.5 g/dL (ref 30.0–36.0)
MCV: 96.7 fL (ref 78.0–100.0)
Platelets: 201 10*3/uL (ref 150–400)
RBC: 3.29 MIL/uL — ABNORMAL LOW (ref 4.22–5.81)
RDW: 19.2 % — ABNORMAL HIGH (ref 11.5–15.5)
WBC: 10.2 10*3/uL (ref 4.0–10.5)

## 2018-05-14 LAB — GLUCOSE, CAPILLARY
GLUCOSE-CAPILLARY: 72 mg/dL (ref 70–99)
GLUCOSE-CAPILLARY: 222 mg/dL — AB (ref 70–99)
GLUCOSE-CAPILLARY: 164 mg/dL — AB (ref 70–99)
GLUCOSE-CAPILLARY: 156 mg/dL — AB (ref 70–99)
Glucose-Capillary: 127 mg/dL — ABNORMAL HIGH (ref 70–99)

## 2018-05-14 LAB — BASIC METABOLIC PANEL
ANION GAP: 11 (ref 5–15)
BUN: 11 mg/dL (ref 8–23)
CO2: 30 mmol/L (ref 22–32)
Calcium: 8.2 mg/dL — ABNORMAL LOW (ref 8.9–10.3)
Chloride: 95 mmol/L — ABNORMAL LOW (ref 98–111)
Creatinine, Ser: 3.26 mg/dL — ABNORMAL HIGH (ref 0.61–1.24)
GFR calc Af Amer: 20 mL/min — ABNORMAL LOW (ref >60)
GFR, EST NON AFRICAN AMERICAN: 18 mL/min — AB (ref >60)
Glucose, Bld: 127 mg/dL — ABNORMAL HIGH (ref 70–99)
POTASSIUM: 3.6 mmol/L (ref 3.5–5.1)
SODIUM: 136 mmol/L (ref 135–145)

## 2018-05-14 LAB — PROTIME-INR
INR: 2.09
Prothrombin Time: 23.3 seconds — ABNORMAL HIGH (ref 11.4–15.2)

## 2018-05-14 LAB — LACTATE DEHYDROGENASE: LDH: 218 U/L — ABNORMAL HIGH (ref 98–192)

## 2018-05-14 MED ORDER — CHLORHEXIDINE GLUCONATE CLOTH 2 % EX PADS: 6.0000 | MEDICATED_PAD | Freq: Every day | CUTANEOUS | Status: DC

## 2018-05-14 MED ORDER — WARFARIN SODIUM 4 MG PO TABS
4.0000 mg | ORAL_TABLET | Freq: Once | ORAL | Status: AC
  Administered 2018-05-14: 4 mg via ORAL
  Filled 2018-05-14: qty 1

## 2018-05-14 NOTE — Progress Notes (Signed)
[facility] Progress Note   Subjective:  No new issues  Objective Vitals:   05/14/18 0645 05/14/18 0650 05/14/18 0955 05/14/18 1141  BP: 103/68     Pulse: 100     Resp:      Temp:      TempSrc:      SpO2:   100% 99%  Weight:  77 kg (169 lb 12.1 oz)    Height:       Physical Exam General: Frail elderly male NAD  Heart: LVAD ;mechanical sounds  Lungs: CTAB no rales Abdomen: soft NT  Extremities: L knee in brace. 2+ pitting RLE edema Dialysis Access: LUE AVF +bruit   Dialysis Orders:  MWFSat [facility] (has been inpatient since May 2019)  4.5hr  83 kg (now 70- 74kg w/o battery)   2/2 bath  L AVF Hep none   Assessment/Plan: 1. Debility - CIR adm 2. GIB - Hx GI AVMs - s/p multiple procedures -laproscopic enteroscopy at [facility] 6/7/s/p multiple transfusions - Hgb stable. Coumadin resumed.  3. Septic L knee - s/p IV antibiotic course I&D x2  4. ICM s/p LVAD - per HF team  5. ESRD - HD MWF extra Sat for volume control; Keep map> 60 on HD for LVAD UF goal usually 2.5L . HD tomorrow.  6. Volume - No gross excess. Wt's down considerably since admission in May. Mild LE edema. Post HD wt on 7/8 -71kg.  7. Anemia- Continue Aranesp 100 q Monday. Keep Hgb >8.   8. MBD- Ca/Phos ok. No binders. On calcitriol 9. Hx TAVR 10. Chronic Afib  11. DM Type 2    Kelly Splinter MD [facility] pager [phone]   05/14/2018, 3:03 PM       Additional Objective Labs: Basic Metabolic Panel: Recent Labs  Lab 05/09/18 0731  05/12/18 0537 05/13/18 0615 05/14/18 0655  NA 136   < > 136 133* 136  K 3.6   < > 4.4 4.5 3.6  CL 97*   < > 99 94* 95*  CO2 31   < > 32 28 30  GLUCOSE 160*   < > 145* 119* 127*  BUN 12   < > 13 24* 11  CREATININE 3.61*   < > 3.78* 5.23* 3.26*  CALCIUM 8.0*   < > 7.9* 8.1* 8.2*  PHOS 2.6  --   --   --   --    < > = values in this interval not displayed.   CBC: Recent Labs  Lab 05/10/18 0657 05/11/18 0537 05/12/18 0537 05/13/18 0615  05/14/18 0655  WBC 10.4 8.7 8.2 9.0 10.2  HGB 9.6* 8.5* 9.1* 9.1* 9.7*  HCT 32.4* 28.3* 30.9* 30.7* 31.8*  MCV 96.4 95.3 97.5 96.8 96.7  PLT 213 162 174 196 201   Blood Culture    Component Value Date/Time   SDES SYNOVIAL LEFT KNEE 03/26/2018 1338   SPECREQUEST NONE 03/26/2018 1338   CULT  03/26/2018 1338    NO GROWTH 3 DAYS Performed at [facility], [place] [address] [place], [place] [address]    REPTSTATUS 03/29/2018 FINAL 03/26/2018 1338    Cardiac Enzymes: No results for input(s): CKTOTAL, CKMB, CKMBINDEX, TROPONINI in the last 168 hours. CBG: Recent Labs  Lab 05/13/18 1647 05/13/18 1810 05/13/18 2124 05/14/18 0643 05/14/18 1112  GLUCAP 72 103* 181* 127* 222*   Iron Studies: No results for input(s): IRON, TIBC, TRANSFERRIN, FERRITIN in the last 72 hours. Lab Results  Component Value Date  INR 2.09 05/14/2018   INR 2.29 05/13/2018   INR 1.90 05/12/2018   Medications: . sodium chloride    . sodium chloride     . atorvastatin  40 mg Oral q1800  . budesonide  0.25 mg Nebulization BID  . busPIRone  5 mg Oral BID  . calcitRIOL  0.5 mcg Oral Q M,W,F  . Chlorhexidine Gluconate Cloth  6 each Topical Q0600  . darbepoetin (ARANESP) injection - DIALYSIS  100 mcg Subcutaneous Q Mon-HD  . feeding supplement (NEPRO CARB STEADY)  237 mL Oral BID WC  . hydrocerin   Topical BID  . insulin aspart  0-15 Units Subcutaneous TID WC  . insulin aspart  0-5 Units Subcutaneous QHS  . insulin aspart  3 Units Subcutaneous TID WC  . insulin glargine  8 Units Subcutaneous QHS  . levothyroxine  25 mcg Oral QAC breakfast  . multivitamin  1 tablet Oral QHS  . pantoprazole  40 mg Oral BID  . [START ON 05/31/2018] pantoprazole  40 mg Oral Daily  . senna-docusate  1 tablet Oral BID  . sildenafil  20 mg Oral TID  . traZODone  25 mg Oral QHS  . warfarin  4 mg Oral ONCE-1800  . Warfarin - Pharmacist Dosing Inpatient   Does not apply [phone]

## 2018-05-14 NOTE — Progress Notes (Signed)
LVAD Coordinator Rounding Note:  HM II LVAD implanted on 12/13/16 by Dr. Darcey Nora under Destination Therapy criteria due to age excluding heart transplantation.  Transferred from [facility] on 04/30/18 following laparoscopic surgical enteroscopy on 6/7 and received 4 clips. He received 13 uPRBCs but had no further bleeding post-op. Last octreotide injection 6/13. Danazol was DC'd. ASA and coumadin were not resumed. VAD parameters were stable.   Course complicated by AMS and concern for ischemic bowel. Improved with NGT decompression and bowel rest. Head CT negative. Blood cultures and UA were negative. Tolerated clears and thought stable for diet advancement.    Additionally, required multiple left knee aspirations and OR washouts. Aspirate cultures positive for staph epi and clostridium perfingen. Started on Vanc and narrowed to Ancef.   Pt in bed complaining that his knee pain is much worse today. States that he is mainly limited by knee pain for his mobility. He states that he is ready to go home.  Vital signs: Temp:  98.4 HR: 84 Automatic BP:  103/68(80) Doppler: 78 O2 Sat: 99% RA Wt:174>166>160>154>169 lbs  LVAD interrogation reveals:  Speed:  9200 Flow:  5.1 Power: 5.7 w PI: 4.9 Alarms: none Events:  Fixed speed: 9200 Low speed limit: 8600   Drive Line:  Right abd dressing CDI. Next dressing change 05/19/18.  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 Margins not clear at neck. - 02/23/18> hospitalization for GI bleed. Colon/EGD 02/27/18 with normal EGD. Colonoscopy identified active bleeding in ileum; source not found. Capsule Endoscopy 02/27/18: Showed active bleeding in the ileum. Received 5 units blood with histamine reaction; will give benadryl as OP. - 03/10/18>transferrred to [facility] for double retro balloon scop and sent to IR emolization x 3 with no evidence of bleeding. Received 12 units PCs, remained off anticoagulants. Transferred back to [facility] 03/20/18 Seen by IR 5/20. CTA obtained, but no active bleeding found. Started on octreotide. 03/25/18: Repeat capsule endoscopy completed. Bleeding appears to come from the mid-ileum. 03/26/18: General surgery reconsulted. Recommended transfer back to [facility] for surgery with intraoperative endoscopy. [name] currently discussing options. Ortho consulted 5/24 for left knee pain. S/p knee aspiration and steroid injection.  Dose of premarin given 5/24.  On 5/25 developed abdominal bloating and distension with n/v. KUB suggestive of ileus versus early SBO. NGT placed for decompression.  Had large bloody BM after this and felt much better, diet now advanced to regular.  - 04/01/18 transferred to [facility] for  laparoscopic surgical enteroscopy for recurrent GI bleed   Plan/Recommendations: 1.  Weekly dressing changes per VAD coordinator. Next dressing change due 05/19/18. 2.  Call VAD pager if any questions re: VAD equipment or drive line site issues.   [name], VAD Coordinator 24/7 VAD pager: [phone]

## 2018-05-14 NOTE — Progress Notes (Signed)
ANTICOAGULATION CONSULT NOTE  Pharmacy Consult for warfarin Indication: LVAD   Patient Measurements: Height: 5\' 5"  (165.1 cm) Weight: 169 lb 12.1 oz (77 kg) IBW/kg (Calculated) : 61.5 Heparin Dosing Weight: 79.5 kg  Vital Signs: Temp: 98.5 F (36.9 C) (07/11 0536) Temp Source: Oral (07/11 0536) BP: 103/68 (07/11 0645) Pulse Rate: 100 (07/11 0645)  Labs: Recent Labs    05/12/18 0537 05/13/18 0615 05/14/18 0655  HGB 9.1* 9.1* 9.7*  HCT 30.9* 30.7* 31.8*  PLT 174 196 201  LABPROT 21.7* 25.0* 23.3*  INR 1.90 2.29 2.09  CREATININE 3.78* 5.23* 3.26*    Medical History: Past Medical History:  Diagnosis Date  . AICD (automatic cardioverter/defibrillator) present   . Asthma   . AVM (arteriovenous malformation) of colon 07/07/2017  . CHF (congestive heart failure) ([place])   . Diabetes ([place])   . ESRF (end stage renal failure) ([place]) 07/07/2017   pt receiving hemodialysis Monday- Wednesday-friday, sometimes Saturday  . Kidney disease   . LVAD (left ventricular assist device) present ([place]) 12/2016  . Permanent atrial fibrillation ([place]) 07/07/2017  . Presence of permanent cardiac pacemaker    AICD  . Sleep apnea     Assessment: 1 yom who underwent HM2 LVAD implantation on 12/14/2015. Has undergoing extensive GI workup to evaluate bleeding. Was stopped on anticoagulants as of admission on 03/09/2018. Last outpatient regimen was (as of 02/17/2018): 7.5 mg daily except 10 mg on Tues/Thurs for goal 2-2.5. Goal INR was decreased to 1.8-2.3 after admission on 02/28/2018. Now s/p laparoscopic surgical enteroscopy with 4 clips on 04/10/2018.  Heparin and warfarin were resumed on 6/27 >> now off heparin infusion. Patient had only received two doses of warfarin prior to INR unexpectedly jumping (peaked at 4.71) previously - have been dosing lower than previous home regimen since.  INR today is 2.09. Hgb 9.7. LDH stable. No s/sx of bleeding noted. No new medication interactions.   Will need to be  very cautious to not overdose given previous trends.   Goal of Therapy:  INR goal: 1.8-2.2 Monitor platelets by anticoagulation protocol: Yes   Plan:  -Warfarin 4 mg PO tonight -Daily INR/CBC  -Monitor s/sx bleeding  Erin Hearing PharmD., BCPS Clinical Pharmacist 05/14/2018 11:02 AM

## 2018-05-14 NOTE — Progress Notes (Signed)
Occupational Therapy Session Note  Patient Details  Name: Trevor [name] MRN: [id number] Date of Birth: 30-Sep-1946  Today's Date: 05/14/2018 OT Individual Time: 1100-1130 OT Individual Time Calculation (min): 30 min  and Today's Date: 05/14/2018 OT Missed Time: 30 Minutes Missed Time Reason: Patient fatigue;Nursing care   Short Term Goals: Week 1:  OT Short Term Goal 1 (Week 1): STG = LTGs due to ELOS  Skilled Therapeutic Interventions/Progress Updates:    Pt seen for OT session, focusing on functional transfers and assessment of vitals. Pt sitting EOB upon arrival, vistors exiting room. Pt requesting to have BS checked as he reported feeling it was dropping and began shaking. RN made aware, snack provided by therapist while awaiting RN. BS 222, other vitals assessed- BP 113/74, O2 95% on RA, HR initially 122 lowering to 80. Pt reports not feeling safe for therapist to leave him sitting EOB. Following rest break, pt felt able to complete stand pivot transfer to standard chair in order for bloody sheets on bed to be changed (blood from skin tear on arm, already addressed by RN and bandaged). Mod A required for stand pivot due to increased pain in L knee.  RN and PA  assessing pt while therapist changed sheets. Pt returned to bed in same manner as described above. Pt left seated EOB with respiratory, RN, and NP present to assess pt.   Therapy Documentation Precautions:  Precautions Precautions: Other (comment), Fall Precaution Comments: LVAD Required Braces or Orthoses: Other Brace/Splint Other Brace/Splint: hinged knee brace per pt comfort Restrictions Weight Bearing Restrictions: No  See Function Navigator for Current Functional Status.   Therapy/Group: Individual Therapy  Jaiquan Temme L 05/14/2018, 6:55 AM

## 2018-05-14 NOTE — Progress Notes (Signed)
     Called Rehab regarding dizziness.   Productive cough. EW noted. Inhalers ordered. CXR ordered.  Maps in 23s. Hgb 9.7  EKG Afib 84 bpm. He is chronically in A fib.  Appears stable will continue to monitor.      Brenae Lasecki NP-C  11:52 AM

## 2018-05-14 NOTE — Progress Notes (Signed)
[place] [facility]IT[facility]     PROGRESS NOTE  Subjective/Complaints:  Lying in bed after being up this morning. States that his weightbearing was better yesterday with brace. Wore it most of the day. BP dropped with HD last night and felt a little weak at time.   ROS: Patient denies fever, rash, sore throat, blurred vision, nausea, vomiting, diarrhea, cough, shortness of breath or chest pain, joint or back pain, headache, or mood change.    Objective: Vital Signs: Blood pressure 103/68, pulse 100, temperature 98.5 F (36.9 C), temperature source Oral, resp. rate 18, height 5\' 5"  (1.651 m), weight 77 kg (169 lb 12.1 oz), SpO2 92 %. No results found. Recent Labs    05/13/18 0615 05/14/18 0655  WBC 9.0 10.2  HGB 9.1* 9.7*  HCT 30.7* 31.8*  PLT 196 201   Recent Labs    05/13/18 0615 05/14/18 0655  NA 133* 136  K 4.5 3.6  CL 94* 95*  GLUCOSE 119* 127*  BUN 24* 11  CREATININE 5.23* 3.26*  CALCIUM 8.1* 8.2*   CBG (last 3)  Recent Labs    05/13/18 1810 05/13/18 2124 05/14/18 0643  GLUCAP 103* 181* 127*    Wt Readings from Last 3 Encounters:  05/14/18 77 kg (169 lb 12.1 oz)  05/05/18 74.2 kg (163 lb 9.3 oz)  04/01/18 90.6 kg (199 lb 11.8 oz)    Physical Exam:  BP 103/68   Pulse 100   Temp 98.5 F (36.9 C) (Oral)   Resp 18   Ht 5\' 5"  (1.651 m)   Wt 77 kg (169 lb 12.1 oz)   SpO2 92%   BMI 28.25 kg/m  Constitutional: No distress . Vital signs reviewed. HEENT: EOMI, oral membranes moist Neck: supple Cardiovascular: RRR without murmur. No JVD    Respiratory: CTA Bilaterally without wheezes or rales. Normal effort    GI: BS +, non-tender, non-distended  Musculoskeletal: B/l LE edema L>R left knee with effusion, hamstrings tight/flexion contracture Neurological: He is alert.  Motor: B/l UE: 4+/5 proximal to distal LLE: 2-3/5 proximal to distal. left knee remains limited by pain RLE: HF 4-/5,   ADF 4/5  Skin:  Left knee incision intact BLE  with dry flaky skin.  Vascular changes b/l LE Psychiatric: He has a normal mood and affect. His behavior is normal.   Assessment/Plan: 1. Functional deficits secondary to debility which require 3+ hours per day of interdisciplinary therapy in a comprehensive inpatient rehab setting. Physiatrist is providing close team supervision and 24 hour management of active medical problems listed below. Physiatrist and rehab team continue to assess barriers to discharge/monitor patient progress toward functional and medical goals.  Function:  Bathing Bathing position Bathing activity did not occur: Refused Position: Wheelchair/chair at sink  Bathing parts Body parts bathed by patient: Right arm, Left arm, Chest, Abdomen, Right upper leg, Left upper leg Body parts bathed by helper: Right lower leg, Left lower leg, Back  Bathing assist        Upper Body Dressing/Undressing Upper body dressing   What is the patient wearing?: Hospital gown                Upper body assist Assist Level: Set up   Set up : To obtain clothing/put away  Lower Body Dressing/Undressing Lower body dressing Lower body dressing/undressing activity did not occur: Refused What is the patient wearing?: Underwear, Non-skid slipper socks Underwear - Performed by patient: Thread/unthread right underwear leg, Thread/unthread left underwear leg  Underwear - Performed by helper: Pull underwear up/down Pants- Performed by patient: Pull pants up/down Pants- Performed by helper: Thread/unthread right pants leg, Thread/unthread left pants leg Non-skid slipper socks- Performed by patient: Don/doff right sock, Don/doff left sock Non-skid slipper socks- Performed by helper: Don/doff right sock, Don/doff left sock                  Lower body assist Assist for lower body dressing: (total/substantial assist)      Toileting Toileting Toileting activity did not occur: No continent bowel/bladder event Toileting steps completed  by patient: Performs perineal hygiene, Adjust clothing prior to toileting(No clothing to adjust post toileting) Toileting steps completed by helper: Performs perineal hygiene Toileting Assistive Devices: Grab bar or rail  Toileting assist Assist level: Touching or steadying assistance (Pt.75%)   Transfers Chair/bed transfer   Chair/bed transfer method: Stand pivot Chair/bed transfer assist level: Supervision or verbal cues Chair/bed transfer assistive device: Environmental consultant, Air cabin crew     Max distance: 30 Assist level: Supervision or verbal cues   Wheelchair   Type: Manual Max wheelchair distance: 226ft  Assist Level: Supervision or verbal cues  Cognition Comprehension Comprehension assist level: Follows complex conversation/direction with extra time/assistive device  Expression Expression assist level: Expresses complex ideas: With extra time/assistive device  Social Interaction Social Interaction assist level: Interacts appropriately with others with medication or extra time (anti-anxiety, antidepressant).  Problem Solving Problem solving assist level: Solves complex problems: With extra time  Memory Memory assist level: Recognizes or recalls 25 - 49% of the time/requires cueing 50 - 75% of the time    Medical Problem List and Plan: 1.  Deficits with mobility, self-care secondary to LVAD as well as left knee infection/aspiration.  Continue CIR.   -needs to reach supervision goals 2.  DVT Prophylaxis/Anticoagulation: Pharmaceutical: Coumadin. Being followed closely by pharmacy.   INR therapeutic 7/11 3. Pain Management: continue oxycodone and/or ultram for pain. Monitor for signs of overdose as HD dependent.  4. Mood: team to provide ego support. LCSW to follow for evaluation and support.  5. Neuropsych: This patient appears capable of making decisions on his own behalf. 6. Skin/Wound Care: routine pressure relief measures.  7. Fluids/Electrolytes/Nutrition:  Renal diet --decrease fluid restriction to 1200.[place] Kitchen  Monitor weights daily. Strict I/O.  8. GIB due to AVMs with ABLA: On Protonix BID. Continue to monitor stools for melena/signs of bleeding. Augment bowel program as needed  Hemoglobin 9.7 on 7/11  Continue to monitor 9. CAD s/p CABG/ICM--s/p LVAD: Strict I/O with daily weights. LVAD self test daily. Monitor LVAD per protocol Filed Weights   05/13/18 1340 05/13/18 1930 05/14/18 0650  Weight: 73.6 kg (162 lb 4.1 oz) 70.3 kg (154 lb 15.7 oz) 77 kg (169 lb 12.1 oz)   -holding around 70kg,   -volume mgt per cards 10 T2DM:  Pt on lantus 8u qhs at home  -added lantus 5u qhs  ---increased to 8u QHS 7/10   - Continue scheduled novolog 3 units with meals for now and moderate SSI.   -sugars showing improvement   -may need am lantus as well to better control PM #s 11. Septic Knee: Has completed antibiotic course on 05/04/18  -continue using knee brace when up  -scheduled ice for knee  -pain medication prior to activity 12. ESRD: HD MWFSa to help with fluid overload/anasarca.  13. Sleep disturbance: Scheduled trazodone as at home.   14. Anxiety disorder: continue Buspar tid.  15. Leukocytosis  WBCs 10.2 on 7/11  Afebrile  Continue to monitor 16. Hypotension  Asymptomatic at present  -HD shortened last night  LOS (Days) 9 A FACE TO FACE EVALUATION WAS PERFORMED  Trevor [name] 05/14/2018 9:14 AM

## 2018-05-14 NOTE — Progress Notes (Signed)
Occupational Therapy Weekly Progress Note  Patient Details  Name: Trevor [name] MRN: [id number] Date of Birth: 04-19-1946  Beginning of progress report period: May 06, 2018 End of progress report period: May 14, 2018  Short term goals not set due to expected short length of stay. Pt has been very limited by L knee pain. He has had limited participation in ADLs during OT session as pt reports he does ADLs prior to therapist arrival. He has been completing functional mobility with supervision with very limited [place] through L LE. Plans for pt's sister to come in for family training on Monday in prep for d/c home.  Patient continues to demonstrate the following deficits: muscle weakness and therefore will continue to benefit from skilled OT intervention to enhance overall performance with BADL and Reduce care partner burden.  Patient progressing toward long term goals..  Continue plan of care.  OT Short Term Goals Week 1:  OT Short Term Goal 1 (Week 1): STG = LTGs due to ELOS OT Short Term Goal 1 - Progress (Week 1): Progressing toward goal Week 2:  OT Short Term Goal 1 (Week 2): Cont to work towards LTG of supervision-mod I overall  Therapy Documentation Precautions:  Precautions Precautions: Other (comment), Fall Precaution Comments: LVAD Required Braces or Orthoses: Other Brace/Splint Other Brace/Splint: hinged knee brace per pt comfort Restrictions Weight Bearing Restrictions: No   [name] L 05/14/2018, 3:10 PM

## 2018-05-14 NOTE — Plan of Care (Signed)
  Problem: Consults Goal: RH GENERAL PATIENT EDUCATION Description See Patient Education module for education specifics. Outcome: Progressing Goal: Diabetes Guidelines if Diabetic/Glucose > 140 Description If diabetic or lab glucose is > 140 mg/dl - Initiate Diabetes/Hyperglycemia Guidelines & Document Interventions  Outcome: Progressing   Problem: RH BOWEL ELIMINATION Goal: RH STG MANAGE BOWEL WITH ASSISTANCE Description STG Manage Bowel with min ssistance.  Outcome: Progressing Goal: RH STG MANAGE BOWEL W/MEDICATION W/ASSISTANCE Description STG Manage Bowel with Medication with min Assistance.  Outcome: Progressing Goal: RH STG MANAGE BOWEL W/EQUIPMENT W/ASSISTANCE Description STG Manage Bowel With Equipment With min  Assistance  Outcome: Progressing Goal: RH OTHER STG BOWEL ELIMINATION GOALS W/ASSIST Description Other STG Bowel Elimination Goals With min Assistance.  Outcome: Progressing   Problem: RH BLADDER ELIMINATION Goal: RH STG MANAGE BLADDER WITH ASSISTANCE Description STG Manage Bladder With mod I  Assistance  Outcome: Progressing   Problem: RH SKIN INTEGRITY Goal: RH STG SKIN FREE OF INFECTION/BREAKDOWN Outcome: Progressing Goal: RH STG MAINTAIN SKIN INTEGRITY WITH ASSISTANCE Description STG Maintain Skin Integrity With mod I Assistance.  Outcome: Progressing Goal: RH STG ABLE TO PERFORM INCISION/WOUND CARE W/ASSISTANCE Description STG Able To Perform Incision/Wound Care With mod I Assistance.  Outcome: Progressing Goal: RH OTHER STG SKIN INTEGRITY GOALS W/ASSIST Description Other STG Skin Integrity Goals With mod I  Assistance.  Outcome: Progressing   Problem: RH SAFETY Goal: RH STG ADHERE TO SAFETY PRECAUTIONS W/ASSISTANCE/DEVICE Description STG Adhere to Safety Precautions With mod I Assistance/Device.  Outcome: Progressing Goal: RH STG DECREASED RISK OF FALL WITH ASSISTANCE Description STG Decreased Risk of Fall With mod I Assistance.   Outcome: Progressing   Problem: RH PAIN MANAGEMENT Goal: RH STG PAIN MANAGED AT OR BELOW PT'S PAIN GOAL Description Less than 3 out of 10  Outcome: Progressing Goal: RH OTHER STG PAIN MANAGEMENT GOALS W/ASSIST Description Other STG Pain Management Goals With mod I Assistance.  Outcome: Progressing   Problem: RH KNOWLEDGE DEFICIT GENERAL Goal: RH STG INCREASE KNOWLEDGE OF SELF CARE AFTER HOSPITALIZATION Outcome: Progressing

## 2018-05-14 NOTE — Progress Notes (Addendum)
Patient ID: Trevor [name], male   DOB: [birth year], 72 y.o.   MRN: [id number]   Advanced Heart Failure VAD Team Note  Subjective:    Feeling good. Knee remains somewhat tender. No bleeding. Current plan is to discharge "early next week"  LVAD Interrogation HM 2: Speed: 9200 Flow: 4.9 PI: 6.5 Power: 5.0. - VAD interrogated personally. Parameters stable.    Objective:    Vital Signs:   Temp:  [97.1 F (36.2 C)-98.5 F (36.9 C)] 98.5 F (36.9 C) (07/11 0536) Pulse Rate:  [58-107] 100 (07/11 0645) Resp:  [16-18] 18 (07/11 0536) BP: (71-120)/(45-71) 103/68 (07/11 0645) SpO2:  [92 %-97 %] 92 % (07/11 0536) Weight:  [154 lb 15.7 oz (70.3 kg)-169 lb 12.1 oz (77 kg)] 169 lb 12.1 oz (77 kg) (07/11 0650) Last BM Date: 05/13/18(per patient) Mean arterial Pressure 70-80s  Intake/Output:   Intake/Output Summary (Last 24 hours) at 05/14/2018 0741 Last data filed at 05/13/2018 2300 Gross per 24 hour  Intake 720 ml  Output 2200 ml  Net -1480 ml     Physical Exam    Physical Exam: General: Well appearing this am. NAD.  HEENT: Normal. Neck: Supple, JVP 7-8 cm. Carotids OK.  Cardiac:  Mechanical heart sounds with LVAD hum present.  Lungs: MIld bibasilar crackles.  Abdomen:  NT, ND, no HSM. No bruits or masses. +BS  LVAD exit site: Dressing dry and intact. No erythema or drainage. Stabilization device present and accurately applied. Driveline dressing changed daily per sterile technique. Extremities:  Warm and dry. No cyanosis, clubbing, rash, or edema.  Neuro:  Alert & oriented x 3. Cranial nerves grossly intact. Moves all 4 extremities w/o difficulty. Affect pleasant      Telemetry   N/A  EKG    No new tracings.    Labs   Basic Metabolic Panel: Recent Labs  Lab 05/09/18 0731 05/10/18 0657 05/11/18 0537 05/12/18 0537 05/13/18 0615  NA 136 136 135 136 133*  K 3.6 3.6 3.7 4.4 4.5  CL 97* 97* 97* 99 94*  CO2 31 31 29  32 28  GLUCOSE 160* 162* 167* 145* 119*  BUN 12 9 19  13  24*  CREATININE 3.61* 3.20* 4.64* 3.78* 5.23*  CALCIUM 8.0* 8.2* 7.9* 7.9* 8.1*  PHOS 2.6  --   --   --   --     Liver Function Tests: No results for input(s): AST, ALT, ALKPHOS, BILITOT, PROT, ALBUMIN in the last 168 hours. No results for input(s): LIPASE, AMYLASE in the last 168 hours. No results for input(s): AMMONIA in the last 168 hours.  CBC: Recent Labs  Lab 05/10/18 0657 05/11/18 0537 05/12/18 0537 05/13/18 0615 05/14/18 0655  WBC 10.4 8.7 8.2 9.0 10.2  HGB 9.6* 8.5* 9.1* 9.1* 9.7*  HCT 32.4* 28.3* 30.9* 30.7* 31.8*  MCV 96.4 95.3 97.5 96.8 96.7  PLT 213 162 174 196 201    INR: Recent Labs  Lab 05/10/18 0657 05/11/18 0537 05/12/18 0537 05/13/18 0615 05/14/18 0655  INR 1.57 1.82 1.90 2.29 2.09    Other results:  EKG:    Imaging   No results found.   Medications:     Scheduled Medications: . atorvastatin  40 mg Oral q1800  . budesonide  0.25 mg Nebulization BID  . busPIRone  5 mg Oral BID  . calcitRIOL  0.5 mcg Oral Q M,W,F  . Chlorhexidine Gluconate Cloth  6 each Topical Q0600  . darbepoetin (ARANESP) injection - DIALYSIS  100 mcg Subcutaneous Q  Mon-HD  . feeding supplement (NEPRO CARB STEADY)  237 mL Oral BID WC  . hydrocerin   Topical BID  . insulin aspart  0-15 Units Subcutaneous TID WC  . insulin aspart  0-5 Units Subcutaneous QHS  . insulin aspart  3 Units Subcutaneous TID WC  . insulin glargine  8 Units Subcutaneous QHS  . levothyroxine  25 mcg Oral QAC breakfast  . multivitamin  1 tablet Oral QHS  . pantoprazole  40 mg Oral BID  . [START ON 05/31/2018] pantoprazole  40 mg Oral Daily  . senna-docusate  1 tablet Oral BID  . sildenafil  20 mg Oral TID  . traZODone  25 mg Oral QHS  . Warfarin - Pharmacist Dosing Inpatient   Does not apply q1800    Infusions: . sodium chloride    . sodium chloride      PRN Medications: sodium chloride, sodium chloride, acetaminophen, albuterol, aluminum hydroxide, benzonatate, bisacodyl,  diphenhydrAMINE, guaiFENesin-dextromethorphan, lidocaine (PF), lidocaine-prilocaine, ondansetron (ZOFRAN) IV, oxyCODONE, pentafluoroprop-tetrafluoroeth, polyethylene glycol, traMADol   Patient Profile   Trevor Goracke Murphyis a 72 y.o.malewith a history of CAD s/p CABG x 4 2010, OSA, AS with TAVR 2015, ,ESRD on HD,DM2,chronicatrial fibrillation, asthma,GI bleed,and ischemic cardiomyopathy with Medtronic ICD.S/PHMII LVAD for Destination therapy on 12/14/2015.  Transferred back from [place] on 6/27 after undergoing enterotomy and clipping of a colonic Dueilafoy lesion as well I&D of septic left knee.  Assessment/Plan:    1. GI Bleed/Symptomatic Anemia:GI AVMs. Double balloon retro endoscopy at [facility], unable to identify bleeding lesion. He failed attempted IR embolization x 3 while at [facility]. 12 units PRBCs at [facility]. Received 15 uPRBC while at [facility] IR consulted, but did not find any active bleeding on CTA 5/20. Repeat capsule study showed active bleeding in the mid ileum.Surgery was reconsulted and recommended transfer back to [facility] for possible surgery.Now s/p laparoscopic surgical enteroscopy with 4 clips on 6/7 at [facility], c/b ischemic bowel/SBO. Now tolerating full diet if he eats in small quantities. Normal BM yesterday, no evidence for overt bleeding.  - Tolerating warfarin. INR 2.09. Goal INR 1.8-2.3, would not bridge with warfarin.  No ASA.  - hgb 9.7 this am.  - Getting Aranesp - He will continue octreotide as outpatient.  2. 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 S/pHM II LVAD placed 2016for DT.Has Medtronic ICD. Volume controlled by HD. MWF/Sat schedule.  VAD interrogated personally. Parameters stable.   -Continue sildenafil 20 mg tid. - VAD interrogated personally. Parameters stable.   - LDH 218, stable.  4. ESRD- M/W/F/Sa  - HD per Renal. Continue MWF/Sa schedule.  5. Chronic Afib - Rate controlled.  - On coumadin.  6. HTN: MAPs stable.    7.Septicleftknee: s/pseveral aspirations and OR wash out x2. Aspirate cultures positive for staph epi and clostridium perfingens. Blood cultures negative at [facility]. Finished Ancef 7/2.  Ortho saw 6/28  Stitches removed 05/05/18 -WBC 10.2.   8. DM II -SSI. No change.  9.Hx ofCAD s/pCABG. - Continue statin 10. Hx ofTAVR:Valvestable on last echo2/2019. No change. 11. Deconditioning - Appreciate CIR care - No change to current plan.   12. Insomnia: Improved with limiting interruptions in night - Trazadone has for him worked in the past. Continue PRN.  - No change to current plan.    I reviewed the LVAD parameters from today, and compared the results to the patient's prior recorded data.  No programming changes were made.  The LVAD is functioning within specified parameters.  The patient performs LVAD  self-test daily.  LVAD interrogation was negative for any significant power changes, alarms or PI events/speed drops.  LVAD equipment check completed and is in good working order.  Back-up equipment present.   LVAD education done on emergency procedures and precautions and reviewed exit site care.   Length of Stay: [address]  Trevor [name] 05/14/2018, 7:41 AM  VAD Team --- VAD ISSUES ONLY--- Pager [phone] (7am - 7am)  Advanced Heart Failure Team  Pager [phone] (M-F; 7a - 4p)  Please contact [facility] for night-coverage after hours (4p -7a ) and weekends on [email or web address]  Patient seen with NP, agree with the above note. LVAD parameters stable. Working with PT, knee is still hurts. Hgb stable at 9.7, no overt bleeding. INR at goal (1.8-2.3). Continue PT as per CIR, plan for discharge next week some time.  Trevor [name] 05/14/2018 3:32 PM

## 2018-05-14 NOTE — Patient Care Conference (Signed)
Inpatient RehabilitationTeam Conference and Plan of Care Update Date: 05/9018   Time: 2:35 PM    Patient Name: Trevor [name]      Medical Record Number: [id number]  Date of Birth: 1946-04-17 Sex: Male         Room/Bed: 4W16C/4W16C-01 Payor Info: Payor: MEDICARE / Plan: MEDICARE PART A AND B / Product Type: *No Product type* /    Admitting Diagnosis: GI Bleed L VAD esrd with Dialysis  Admit Date/Time:  05/05/2018  6:33 PM Admission Comments: No comment available   Primary Diagnosis:  <principal problem not specified> Principal Problem: <principal problem not specified>  Patient Active Problem List   Diagnosis Date Noted  . Subtherapeutic international normalized ratio (INR)   . Arterial hypotension   . Leukocytosis   . Acute blood loss anemia   . History of GI bleed   . Physical debility 05/05/2018  . Debility   . Diabetes mellitus type 2 in nonobese (HCC)   . ESRD on dialysis ([place])   . Pyogenic arthritis of left knee joint ([place])   . Sleep disturbance   . Generalized anxiety disorder   . Ileus ([place])   . Arteriovenous malformation   . GI bleed 03/09/2018  . Small bowel bleed not requiring more than 4 units of blood in 24 hours, ICU, or surgery   . Heme positive stool   . Cellulitis 12/03/2017  . 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 Chronic systolic CHF (congestive heart failure) ([place]) 09/08/2017  . ESRD (end stage renal disease) ([place]) 07/07/2017  . AVM (arteriovenous malformation) of colon 07/07/2017  . Thrombocytopenia ([place]) 07/07/2017  . Permanent atrial fibrillation ([place]) 07/07/2017  . Anemia of chronic disease 07/07/2017  . CHF (congestive heart failure), NYHA class IV ([place]) 06/13/2017  . AVM (arteriovenous malformation) of colon with hemorrhage   . Symptomatic anemia 02/10/2017  . Type 2 diabetes mellitus ([place]) 01/17/2017  . COPD (chronic obstructive pulmonary disease) ([place]) 01/17/2017  . LVAD (left ventricular assist device) present ([place]) 01/09/2017   . Presence of left ventricular assist device (LVAD) ([place])   . Palliative care encounter   . Cardiorenal syndrome with renal failure 11/28/2016  . Cardiomyopathy, ischemic 11/27/2016  . Coronary artery disease 11/27/2016  . Status post aortic valve replacement 11/27/2016  . Asthma, chronic 11/27/2016  . OSA (obstructive sleep apnea) 11/27/2016    Expected Discharge Date: Expected Discharge Date: 05/19/18  Team Members Present: Physician leading conference: Dr. Alger Simons Social Worker Present: Lennart Pall, LCSW Nurse Present: Arelia Sneddon, RN PT Present: Canary Brim, PT OT Present: Napoleon Form, OT PPS Coordinator present : Daiva Nakayama, RN, CRRN     Current Status/Progress Goal Weekly Team Focus  Medical   lvad, septic left knee, debility after numerous complications. pain issues currently, cardiology assisting for cardiac mgt  improve stamina, activity tolerance  volume, nutrition, pain, wound care, diabetic mgt   Bowel/Bladder   Pt is dialysis oliguric, continent of bowel LBM 07/08  continue with dialysis remain continent of bowel with normal bowel function  assist with toileting as needed laxatives prn   Swallow/Nutrition/ Hydration             ADL's   Limited participation due to L knee pain, can complete functional mobility at supervision level using RW when able  Mod I  Functional activity tolerance, ADL re-training, family ed, d/c planning   Mobility   supervision-min assist for gait up to 20 ft, limited by L  knee pain  Mod I  LE strengthening, L knee pain management, endurance, activity tolerance, balance, d/c planning, ed   Communication             Safety/Cognition/ Behavioral Observations            Pain             Skin              Rehab Goals Patient on target to meet rehab goals: Yes *See Care Plan and progress notes for long and short-term goals.     Barriers to Discharge  Current Status/Progress Possible Resolutions Date Resolved   Physician     Medical stability        see medical progress notes      Nursing                  PT                    OT                  SLP                SW                Discharge Planning/Teaching Needs:  Pt to return home with his sister, [name], who can provide any needed assistance.  Teaching needs TBD   Team Discussion:  Multiple medical issues:  LVAD/HD/GIB/Septic knee.  Slow improvement in strength overlal. Min assist overall with OT.  Very reluctant to beat weight and does not like to wear brace.  Eating very little - MD aware - does hit 50% with most meals.  Has mod ind/ supervision goals.  Home with sister.  Revisions to Treatment Plan:  None    Continued Need for Acute Rehabilitation Level of Care: The patient requires daily medical management by a physician with specialized training in physical medicine and rehabilitation for the following conditions: Daily direction of a multidisciplinary physical rehabilitation program to ensure safe treatment while eliciting the highest outcome that is of practical value to the patient.: Yes Daily medical management of patient stability for increased activity during participation in an intensive rehabilitation regime.: Yes Daily analysis of laboratory values and/or radiology reports with any subsequent need for medication adjustment of medical intervention for : Wound care problems;Cardiac problems  Donovon Micheletti 05/15/2018, 11:22 AM

## 2018-05-14 NOTE — Progress Notes (Signed)
Physical Therapy Session Note  Patient Details  Name: Trevor [name] MRN: [id number] Date of Birth: Dec 26, 1945  Today's Date: 05/14/2018 PT Individual Time: 1450-1505 PT Individual Time Calculation (min): 15 min  and Today's Date: 05/14/2018 PT Missed Time: 60 Minutes Missed Time Reason: Patient fatigue;Other (Comment)(requesting to rest remainder of day 2/2 fatigue and dizziness episode this morning)  Short Term Goals: Week 2:  PT Short Term Goal 1 (Week 2): = LTGs  Skilled Therapeutic Interventions/Progress Updates:   Pt sitting in chair upon arrival, discussed episode of dizziness this morning. Per pt, LVAD coordinator attributed episode to too tight ted hose. Pt requesting to rest remainder of day to prevent further issues today. Educated on keeping LLE in elevated position for swelling and pain relief. Pt has been noted to be sitting up most of the day by this therapist w/ LE in dependent position. Pt verbalized understanding and in agreement. Missed 60 min of skilled PT 2/2 fatigue.   Therapy Documentation Precautions:  Precautions Precautions: Other (comment), Fall Precaution Comments: LVAD Required Braces or Orthoses: Other Brace/Splint Other Brace/Splint: hinged knee brace per pt comfort Restrictions Weight Bearing Restrictions: No General: PT Amount of Missed Time (min): 60 Minutes PT Missed Treatment Reason: Patient fatigue;Other (Comment)(requesting to rest remainder of day 2/2 fatigue and dizziness episode this morning) Vital Signs: Oxygen Therapy SpO2: 99 % O2 Device: Room Air Pain: Pain Assessment Pain Scale: 0-10 Pain Score: 3  Pain Type: Acute pain Pain Location: Knee Pain Orientation: Left Pain Descriptors / Indicators: Throbbing Pain Onset: On-going Patients Stated Pain Goal: 3 Pain Intervention(s): Medication (See eMAR)  See Function Navigator for Current Functional Status.   Therapy/Group: Individual Therapy  Jai Steil K Arnette 05/14/2018, 3:13 PM

## 2018-05-14 NOTE — Progress Notes (Signed)
Physical Therapy Session Note  Patient Details  Name: Trevor [name] MRN: [id number] Date of Birth: 05-02-1946  Today's Date: 05/14/2018 PT Individual Time: 0830-0926 PT Individual Time Calculation (min): 56 min   Short Term Goals: Week 2:  PT Short Term Goal 1 (Week 2): = LTGs  Skilled Therapeutic Interventions/Progress Updates:  Pt in bed agreeable to therapy.  Pt performs bed mobility with supervision supine <> sit multiple times throughout session.  Household gait training in room to bathroom, sink and door with RW all with supervision, increased time due to LT LE pain.  Pt performs toilet transfers and toileting with supervision.  Standing balance at sink to wash face and hands with supervision. Pt' standing tolerance limited by Lt LE pain, requires frequent seated rests.  Pt requires total assist to don ted hose and knee brace and socks. Pt left seated edge of bed with alarm set, visitor present, needs at hand.  Therapy Documentation Precautions:  Precautions Precautions: Other (comment), Fall Precaution Comments: LVAD Required Braces or Orthoses: Other Brace/Splint Other Brace/Splint: hinged knee brace per pt comfort Restrictions Weight Bearing Restrictions: No Pain: Pain Assessment Pain Scale: 0-10 Pain Score: 5 (working with therapy) Pain Type: Acute pain Pain Location: Leg Pain Orientation: Left Pain Descriptors / Indicators: Constant Pain Onset: On-going Pain Intervention(s): monitored during session, pain meds given prior to session    Therapy/Group: Individual Therapy  Darby Shadwick 05/14/2018, 9:28 AM

## 2018-05-14 NOTE — Progress Notes (Signed)
Physical Therapy Weekly Progress Note  Patient Details  Name: IHOR MEINZER MRN: [id number] Date of Birth: 1945/12/25  Beginning of progress report period: May 06, 2018 End of progress report period: May 14, 2018   Patient is progressing towards long term goals and is currently min A/supervision with gait, supervision with w/c mobility and LVAD parts management.  Patient continues to demonstrate the following deficits muscle weakness, decreased cardiorespiratoy endurance and decreased standing balance and decreased balance strategies and therefore will continue to benefit from skilled PT intervention to increase functional independence with mobility.  Patient progressing toward long term goals..  Continue plan of care.  PT Short Term Goals Week 2:  PT Short Term Goal 1 (Week 2): = LTGs  Skilled Therapeutic Interventions/Progress Updates:  Disease management/prevention;Ambulation/gait training;Pain management;Stair training;Wheelchair propulsion/positioning;Therapeutic Activities;Visual/perceptual remediation/compensation;Patient/family education;DME/adaptive equipment instruction;Balance/vestibular training;Cognitive remediation/compensation;Psychosocial support;UE/LE Strength taining/ROM;Skin care/wound management;Therapeutic Exercise;Functional mobility training;Community reintegration;Discharge planning;Neuromuscular re-education;Splinting/orthotics;UE/LE Coordination activities     [place] 05/14/2018, 8:13 AM

## 2018-05-14 NOTE — Progress Notes (Signed)
Social Work Patient ID: Trevor [name], male   DOB: November 11, 1945, 72 y.o.   MRN: [id number]   Met with pt and sister, [name], yesterday to review team conference.  Both aware and agreeable with targeted d/c date of 7/16 with mod ind/supervision goals overall.  Sister plans to be here Monday at 10 to observe therapies.  She has some questions about DME, follow up and medical follow up.  Addressed what I could answer.  Explained that we can have PA talk with her when she is here on Monday.  Continue to follow.  Raima Geathers, LCSW

## 2018-05-15 ENCOUNTER — Inpatient Hospital Stay (HOSPITAL_COMMUNITY): Payer: Medicare Other | Admitting: Occupational Therapy

## 2018-05-15 ENCOUNTER — Inpatient Hospital Stay (HOSPITAL_COMMUNITY): Payer: Medicare Other | Admitting: Physical Therapy

## 2018-05-15 LAB — CBC
HEMATOCRIT: 32.4 % — AB (ref 39.0–52.0)
HEMOGLOBIN: 9.6 g/dL — AB (ref 13.0–17.0)
MCH: 28.7 pg (ref 26.0–34.0)
MCHC: 29.6 g/dL — ABNORMAL LOW (ref 30.0–36.0)
MCV: 96.7 fL (ref 78.0–100.0)
Platelets: 233 10*3/uL (ref 150–400)
RBC: 3.35 MIL/uL — AB (ref 4.22–5.81)
RDW: 19.7 % — AB (ref 11.5–15.5)
WBC: 11.1 10*3/uL — AB (ref 4.0–10.5)

## 2018-05-15 LAB — GLUCOSE, CAPILLARY
GLUCOSE-CAPILLARY: 63 mg/dL — AB (ref 70–99)
Glucose-Capillary: 106 mg/dL — ABNORMAL HIGH (ref 70–99)
Glucose-Capillary: 156 mg/dL — ABNORMAL HIGH (ref 70–99)
Glucose-Capillary: 53 mg/dL — ABNORMAL LOW (ref 70–99)
Glucose-Capillary: 97 mg/dL (ref 70–99)
Glucose-Capillary: 167 mg/dL — ABNORMAL HIGH (ref 70–99)

## 2018-05-15 LAB — PROTIME-INR
INR: 1.95
Prothrombin Time: 22.1 seconds — ABNORMAL HIGH (ref 11.4–15.2)

## 2018-05-15 LAB — COMPREHENSIVE METABOLIC PANEL
ALBUMIN: 2.7 g/dL — AB (ref 3.5–5.0)
ALK PHOS: 101 U/L (ref 38–126)
ALT: 12 U/L (ref 0–44)
ANION GAP: 10 (ref 5–15)
AST: 32 U/L (ref 15–41)
BUN: 6 mg/dL — AB (ref 8–23)
CALCIUM: 8 mg/dL — AB (ref 8.9–10.3)
CO2: 30 mmol/L (ref 22–32)
Chloride: 96 mmol/L — ABNORMAL LOW (ref 98–111)
Creatinine, Ser: 2.48 mg/dL — ABNORMAL HIGH (ref 0.61–1.24)
GFR calc Af Amer: 28 mL/min — ABNORMAL LOW (ref >60)
GFR calc non Af Amer: 24 mL/min — ABNORMAL LOW (ref >60)
GLUCOSE: 201 mg/dL — AB (ref 70–99)
Potassium: 4.4 mmol/L (ref 3.5–5.1)
Sodium: 136 mmol/L (ref 135–145)
TOTAL PROTEIN: 6.8 g/dL (ref 6.5–8.1)
Total Bilirubin: 0.7 mg/dL (ref 0.3–1.2)

## 2018-05-15 LAB — BASIC METABOLIC PANEL
ANION GAP: 15 (ref 5–15)
BUN: 23 mg/dL (ref 8–23)
CHLORIDE: 96 mmol/L — AB (ref 98–111)
CO2: 27 mmol/L (ref 22–32)
Calcium: 8.4 mg/dL — ABNORMAL LOW (ref 8.9–10.3)
Creatinine, Ser: 4.84 mg/dL — ABNORMAL HIGH (ref 0.61–1.24)
GFR calc non Af Amer: 11 mL/min — ABNORMAL LOW (ref >60)
GFR, EST AFRICAN AMERICAN: 13 mL/min — AB (ref >60)
Glucose, Bld: 108 mg/dL — ABNORMAL HIGH (ref 70–99)
POTASSIUM: 3.9 mmol/L (ref 3.5–5.1)
SODIUM: 138 mmol/L (ref 135–145)

## 2018-05-15 LAB — LACTATE DEHYDROGENASE: LDH: 269 U/L — ABNORMAL HIGH (ref 98–192)

## 2018-05-15 MED ORDER — WARFARIN SODIUM 5 MG PO TABS
5.0000 mg | ORAL_TABLET | Freq: Once | ORAL | Status: AC
  Administered 2018-05-15: 5 mg via ORAL
  Filled 2018-05-15: qty 1

## 2018-05-15 MED ORDER — TRAMADOL HCL 50 MG PO TABS
50.0000 mg | ORAL_TABLET | Freq: Two times a day (BID) | ORAL | Status: DC
  Administered 2018-05-15 – 2018-05-19 (×9): 50 mg via ORAL
  Filled 2018-05-15 (×9): qty 1

## 2018-05-15 MED ORDER — INSULIN GLARGINE 100 UNIT/ML ~~LOC~~ SOLN
6.0000 [IU] | Freq: Every day | SUBCUTANEOUS | Status: DC
  Administered 2018-05-17: 6 [IU] via SUBCUTANEOUS
  Filled 2018-05-15 (×4): qty 0.06

## 2018-05-15 MED ORDER — INSULIN GLARGINE 100 UNIT/ML ~~LOC~~ SOLN
4.0000 [IU] | Freq: Every day | SUBCUTANEOUS | Status: DC
  Administered 2018-05-15 – 2018-05-19 (×2): 4 [IU] via SUBCUTANEOUS
  Filled 2018-05-15 (×5): qty 0.04

## 2018-05-15 NOTE — Progress Notes (Addendum)
Patient ID: JULIO ZAPPIA, male   DOB: Oct 22, 1946, 72 y.o.   MRN: [id number]   Advanced Heart Failure VAD Team Note  Subjective:    Feeling good overall. Noted productive cough yesterday, but had episode of coughing and none since. Feels like it was an isolated episode. No fever or chills. Left still causing pain and limiting mobility.   CXR 05/14/18 with no active lung disease.   LVAD Interrogation HM 2: Speed: 9200 Flow: 5.1 PI: 6.3 Power: 5.0 - VAD interrogated personally. Parameters stable.    Objective:    Vital Signs:   Temp:  [97.9 F (36.6 C)-98.6 F (37 C)] 97.9 F (36.6 C) (07/12 0532) Pulse Rate:  [75-84] 76 (07/12 0532) Resp:  [16-18] 16 (07/12 0532) BP: (80-110)/(49-87) 101/87 (07/12 0420) SpO2:  [94 %-100 %] 94 % (07/12 0532) Weight:  [154 lb 1.6 oz (69.9 kg)] 154 lb 1.6 oz (69.9 kg) (07/12 0621) Last BM Date: 05/13/18 Mean arterial Pressure 70-80s  Intake/Output:   Intake/Output Summary (Last 24 hours) at 05/15/2018 0742 Last data filed at 05/14/2018 1836 Gross per 24 hour  Intake 360 ml  Output -  Net 360 ml     Physical Exam    Physical Exam: General: Well appearing this am. NAD.  HEENT: Normal. Neck: Supple, JVP 7-8 cm. Carotids OK.  Cardiac:  Mechanical heart sounds with LVAD hum present.  Lungs:  CTAB, normal effort.  Abdomen:  NT, ND, no HSM. No bruits or masses. +BS  LVAD exit site: Dressing dry and intact. No erythema or drainage. Stabilization device present and accurately applied. Driveline dressing changed daily per sterile technique. Extremities:  Warm and dry. No cyanosis, clubbing, rash, or edema.  Neuro:  Alert & oriented x 3. Cranial nerves grossly intact. Moves all 4 extremities w/o difficulty. Affect pleasant     Telemetry   N/A  EKG    No new tracings.    Labs   Basic Metabolic Panel: Recent Labs  Lab 05/09/18 0731  05/11/18 0537 05/12/18 0537 05/13/18 0615 05/14/18 0655 05/15/18 0614  NA 136   < > 135 136 133* 136  138  K 3.6   < > 3.7 4.4 4.5 3.6 3.9  CL 97*   < > 97* 99 94* 95* 96*  CO2 31   < > 29 32 28 30 27   GLUCOSE 160*   < > 167* 145* 119* 127* 108*  BUN 12   < > 19 13 24* 11 23  CREATININE 3.61*   < > 4.64* 3.78* 5.23* 3.26* 4.84*  CALCIUM 8.0*   < > 7.9* 7.9* 8.1* 8.2* 8.4*  PHOS 2.6  --   --   --   --   --   --    < > = values in this interval not displayed.    Liver Function Tests: No results for input(s): AST, ALT, ALKPHOS, BILITOT, PROT, ALBUMIN in the last 168 hours. No results for input(s): LIPASE, AMYLASE in the last 168 hours. No results for input(s): AMMONIA in the last 168 hours.  CBC: Recent Labs  Lab 05/11/18 0537 05/12/18 0537 05/13/18 0615 05/14/18 0655 05/15/18 0614  WBC 8.7 8.2 9.0 10.2 11.1*  HGB 8.5* 9.1* 9.1* 9.7* 9.6*  HCT 28.3* 30.9* 30.7* 31.8* 32.4*  MCV 95.3 97.5 96.8 96.7 96.7  PLT 162 174 196 201 233    INR: Recent Labs  Lab 05/11/18 0537 05/12/18 0537 05/13/18 0615 05/14/18 0655 05/15/18 0614  INR 1.82 1.90 2.29 2.09  1.95    Other results:  EKG:    Imaging   Dg Chest 2 View  Result Date: 05/14/2018 CLINICAL DATA:  Cough, LVAD present, end-stage renal disease EXAM: CHEST - 2 VIEW COMPARISON:  Chest x-ray of 03/09/2018 and 02/23/2018 FINDINGS: No active infiltrate or effusion is seen. LVAD device is again noted. AICD leads are present. The heart is mildly enlarged. Aortic valve replacement is noted. Median sternotomy sutures are present. IMPRESSION: 1. No active lung disease. 2. LVAD present.  Stable cardiomegaly. 3. AICD leads remain. Electronically Signed   By: Ivar Drape M.D.   On: 05/14/2018 12:52     Medications:     Scheduled Medications: . atorvastatin  40 mg Oral q1800  . budesonide  0.25 mg Nebulization BID  . busPIRone  5 mg Oral BID  . calcitRIOL  0.5 mcg Oral Q M,W,F  . Chlorhexidine Gluconate Cloth  6 each Topical Q0600  . Chlorhexidine Gluconate Cloth  6 each Topical Q0600  . darbepoetin (ARANESP) injection -  DIALYSIS  100 mcg Subcutaneous Q Mon-HD  . feeding supplement (NEPRO CARB STEADY)  237 mL Oral BID WC  . hydrocerin   Topical BID  . insulin aspart  0-15 Units Subcutaneous TID WC  . insulin aspart  0-5 Units Subcutaneous QHS  . insulin aspart  3 Units Subcutaneous TID WC  . insulin glargine  8 Units Subcutaneous QHS  . levothyroxine  25 mcg Oral QAC breakfast  . multivitamin  1 tablet Oral QHS  . pantoprazole  40 mg Oral BID  . [START ON 05/31/2018] pantoprazole  40 mg Oral Daily  . senna-docusate  1 tablet Oral BID  . sildenafil  20 mg Oral TID  . traZODone  25 mg Oral QHS  . Warfarin - Pharmacist Dosing Inpatient   Does not apply q1800    Infusions: . sodium chloride    . sodium chloride      PRN Medications: sodium chloride, sodium chloride, acetaminophen, albuterol, aluminum hydroxide, benzonatate, bisacodyl, diphenhydrAMINE, guaiFENesin-dextromethorphan, lidocaine (PF), lidocaine-prilocaine, ondansetron (ZOFRAN) IV, oxyCODONE, pentafluoroprop-tetrafluoroeth, polyethylene glycol, traMADol   Patient Profile   Elizer Bostic Murphyis a 72 y.o.malewith a history of CAD s/p CABG x 4 2010, OSA, AS with TAVR 2015, ,ESRD on HD,DM2,chronicatrial fibrillation, asthma,GI bleed,and ischemic cardiomyopathy with Medtronic ICD.S/PHMII LVAD for Destination therapy on 12/14/2015.  Transferred back from [place] on 6/27 after undergoing enterotomy and clipping of a colonic Dueilafoy lesion as well I&D of septic left knee.  Assessment/Plan:    1. GI Bleed/Symptomatic Anemia:GI AVMs. Double balloon retro endoscopy at [facility], unable to identify bleeding lesion. He failed attempted IR embolization x 3 while at [facility]. 12 units PRBCs at [facility]. Received 15 uPRBC while at [facility] IR consulted, but did not find any active bleeding on CTA 5/20. Repeat capsule study showed active bleeding in the mid ileum.Surgery was reconsulted and recommended transfer back to [facility] for possible surgery.Now s/p laparoscopic  surgical enteroscopy with 4 clips on 6/7 at [facility] [address], c/b ischemic bowel/SBO. Now tolerating full diet if he eats in small quantities. Normal BM yesterday, no evidence for overt bleeding.  - Tolerating warfarin. INR 1.95 Goal INR 1.8-2.3, would not bridge with warfarin.  No ASA.  - Hgb 9.6 this am. No bleeding.  - Getting Aranesp - He will continue octreotide as outpatient.  2. 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 S/pHM II LVAD placed 2043for DT.Has Medtronic ICD. Volume controlled by HD. MWF/Sat schedule.  VAD interrogated personally. Parameters stable.   -Continue  sildenafil 20 mg tid. - VAD interrogated personally. Parameters stable.   - LDH 269. Follow.  4. ESRD- M/W/F/Sa  - HD per Renal. Continue MWF/Sa schedule.  - No change to current plan.   5. Chronic Afib - Rate controlled.  - On coumadin.  6. HTN: - MAPs stable.   7.Septicleftknee: s/pseveral aspirations and OR wash out x2. Aspirate cultures positive for staph epi and clostridium perfingens. Blood cultures negative at [facility]. Finished Ancef 7/2.  Ortho saw 6/28  Stitches removed 05/05/18 -WBC 11.1 8. DM II -SSI. No change.  9.Hx ofCAD s/pCABG. - Continue statin 10. Hx ofTAVR:Valvestable on last echo2/2019. No change.  11. Deconditioning - Appreciate CIR care - No change to current plan.   12. Insomnia: Improved with limiting interruptions in night - Trazadone has for him worked in the past. Continue PRN.  - No change to current plan.    I reviewed the LVAD parameters from today, and compared the results to the patient's prior recorded data.  No programming changes were made.  The LVAD is functioning within specified parameters.  The patient performs LVAD self-test daily.  LVAD interrogation was negative for any significant power changes, alarms or PI events/speed drops.  LVAD equipment check completed and is in good working order.  Back-up equipment present.   LVAD education done on emergency  procedures and precautions and reviewed exit site care.   Length of Stay: [address]  Annamaria Helling 05/15/2018, 7:42 AM  VAD Team --- VAD ISSUES ONLY--- Pager [phone] (7am - 7am)  Advanced Heart Failure Team  Pager [phone] (M-F; 7a - 4p)  Please contact [facility] for night-coverage after hours (4p -7a ) and weekends on [email or web address]  Agree with the above note. LVAD parameters stable. Hemoglobin stable, no overt bleeding. INR at goal(1.8-2.3).Continue PT as per CIR, plan for discharge next week some time.  Loralie Champagne 05/15/2018

## 2018-05-15 NOTE — Progress Notes (Signed)
Physical Therapy Session Note  Patient Details  Name: Trevor [name] MRN: [id number] Date of Birth: 12-10-1945  Today's Date: 05/15/2018 PT Individual Time: 0930-1011 PT Individual Time Calculation (min): 41 min   Short Term Goals: Week 2:  PT Short Term Goal 1 (Week 2): = LTGs  Skilled Therapeutic Interventions/Progress Updates:   pt fatigued from prior therapy session but willing to "do a little" with PT.  Pt performs sit <> stand throughout session from various surfaces with supervision, increased time. Pt performs gait with RW with supervision 2 x 20'.  nustep x 8 minutes level 4 with pt maintaining 60 steps per minute.  Pt given handout of supine HEP to perform at home, pt verbalizes understanding.  Discussed home set up with pt and recommendations for HHPT, pt verbalizes understanding. Pt left in recliner with ice applied to LT knee,needs at hand.   Therapy Documentation Precautions:  Precautions Precautions: Other (comment), Fall Precaution Comments: LVAD Required Braces or Orthoses: Other Brace/Splint Other Brace/Splint: hinged knee brace per pt comfort Restrictions Weight Bearing Restrictions: No General: PT Amount of Missed Time (min): 15 Minutes PT Missed Treatment Reason: Patient fatigue Pain: Pt resting with ice on Lt knee when PT entered, ice applied after therapy   Therapy/Group: Individual Therapy  DONAWERTH,KAREN 05/15/2018, 10:12 AM

## 2018-05-15 NOTE — Significant Event (Signed)
Hypoglycemic Event  CBG: 53  Treatment: 15 GM carbohydrate snack  Symptoms: Nervous/irritable  Follow-up CBG: Time:1850 CBG Result:63  Possible Reasons for Event: Unknown  Comments/MD notified:Pam Love notified    Collene Leyden Deb Loudin

## 2018-05-15 NOTE — Progress Notes (Addendum)
ANTICOAGULATION CONSULT NOTE  Pharmacy Consult for warfarin Indication: LVAD   Patient Measurements: Height: 5\' 5"  (165.1 cm) Weight: 154 lb 1.6 oz (69.9 kg) IBW/kg (Calculated) : 61.5 Heparin Dosing Weight: 79.5 kg  Vital Signs: Temp: 97.9 F (36.6 C) (07/12 0532) Temp Source: Oral (07/12 0532) BP: 100/65 (07/12 0907) Pulse Rate: 76 (07/12 0532)  Labs: Recent Labs    05/13/18 0615 05/14/18 0655 05/15/18 0614  HGB 9.1* 9.7* 9.6*  HCT 30.7* 31.8* 32.4*  PLT 196 201 233  LABPROT 25.0* 23.3* 22.1*  INR 2.29 2.09 1.95  CREATININE 5.23* 3.26* 4.84*    Medical History: Past Medical History:  Diagnosis Date  . AICD (automatic cardioverter/defibrillator) present   . Asthma   . AVM (arteriovenous malformation) of colon 07/07/2017  . CHF (congestive heart failure) ([place])   . Diabetes ([place])   . ESRF (end stage renal failure) ([place]) 07/07/2017   pt receiving hemodialysis Monday- Wednesday-friday, sometimes Saturday  . Kidney disease   . LVAD (left ventricular assist device) present ([place]) 12/2016  . Permanent atrial fibrillation ([place]) 07/07/2017  . Presence of permanent cardiac pacemaker    AICD  . Sleep apnea     Assessment: 10 yom who underwent HM2 LVAD implantation on 12/14/2015. Has undergoing extensive GI workup to evaluate bleeding. Was stopped on anticoagulants as of admission on 03/09/2018. Last outpatient regimen was (as of 02/17/2018): 7.5 mg daily except 10 mg on Tues/Thurs for goal 2-2.5. Goal INR was decreased to 1.8-2.3 after admission on 02/28/2018. Now s/p laparoscopic surgical enteroscopy with 4 clips on 04/10/2018.  Heparin and warfarin were resumed on 6/27 >> now off heparin infusion. Patient had only received two doses of warfarin prior to INR unexpectedly jumping (peaked at 4.71) previously - have been dosing lower than previous home regimen since.  INR today is down to 1.95 but within his goal range. Hgb 9.6. LDH stable. No s/sx of bleeding noted. No new medication  interactions.   Will need to be very cautious to not overdose given previous trends.   Goal of Therapy:  INR goal: 1.8-2.2 Monitor platelets by anticoagulation protocol: Yes   Plan:  -Warfarin 5 mg PO tonight -Daily INR/CBC  -Monitor s/sx bleeding  Erin Hearing PharmD., BCPS Clinical Pharmacist 05/15/2018 10:21 AM

## 2018-05-15 NOTE — Progress Notes (Signed)
Social Work Patient ID: Trevor [name], male   DOB: Aug 29, 1946, 72 y.o.   MRN: [id number]    [name]  Social Worker  General Practice  Patient Care Conference  Signed  Date of Service:  05/14/2018 12:53 PM          Signed          Show:Clear all [x] Manual[x] Template[] Copied  Added by: [x] Lowella Curb, LCSW   [] Hover for details   Inpatient RehabilitationTeam Conference and Plan of Care Update Date: 05/9018   Time: 2:35 PM      Patient Name: Trevor [name]      Medical Record Number: [id number]  Date of Birth: 06-04-1946 Sex: Male         Room/Bed: 4W16C/4W16C-01 Payor Info: Payor: MEDICARE / Plan: MEDICARE PART A AND B / Product Type: *No Product type* /     Admitting Diagnosis: GI Bleed L VAD esrd with Dialysis  Admit Date/Time:  05/05/2018  6:33 PM Admission Comments: No comment available    Primary Diagnosis:  <principal problem not specified> Principal Problem: <principal problem not specified>       Patient Active Problem List    Diagnosis Date Noted  . Subtherapeutic international normalized ratio (INR)    . Arterial hypotension    . Leukocytosis    . Acute blood loss anemia    . History of GI bleed    . Physical debility 05/05/2018  . Debility    . Diabetes mellitus type 2 in nonobese (HCC)    . ESRD on dialysis ([place])    . Pyogenic arthritis of left knee joint ([place])    . Sleep disturbance    . Generalized anxiety disorder    . Ileus ([place])    . Arteriovenous malformation    . GI bleed 03/09/2018  . Small bowel bleed not requiring more than 4 units of blood in 24 hours, ICU, or surgery    . Heme positive stool    . Cellulitis 12/03/2017  . 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 Chronic systolic CHF (congestive heart failure) ([place]) 09/08/2017  . ESRD (end stage renal disease) ([place]) 07/07/2017  . AVM (arteriovenous malformation) of colon 07/07/2017  . Thrombocytopenia ([place]) 07/07/2017  . Permanent atrial fibrillation  ([place]) 07/07/2017  . Anemia of chronic disease 07/07/2017  . CHF (congestive heart failure), NYHA class IV ([place]) 06/13/2017  . AVM (arteriovenous malformation) of colon with hemorrhage    . Symptomatic anemia 02/10/2017  . Type 2 diabetes mellitus ([place]) 01/17/2017  . COPD (chronic obstructive pulmonary disease) ([place]) 01/17/2017  . LVAD (left ventricular assist device) present ([place]) 01/09/2017  . Presence of left ventricular assist device (LVAD) ([place])    . Palliative care encounter    . Cardiorenal syndrome with renal failure 11/28/2016  . Cardiomyopathy, ischemic 11/27/2016  . Coronary artery disease 11/27/2016  . Status post aortic valve replacement 11/27/2016  . Asthma, chronic 11/27/2016  . OSA (obstructive sleep apnea) 11/27/2016      Expected Discharge Date: Expected Discharge Date: 05/19/18   Team Members Present: Physician leading conference: Dr. Alger Simons Social Worker Present: Lennart Pall, LCSW Nurse Present: Arelia Sneddon, RN PT Present: Canary Brim, PT OT Present: Napoleon Form, OT PPS Coordinator present : Daiva Nakayama, RN, CRRN       Current Status/Progress Goal Weekly Team Focus  Medical     lvad, septic left knee, debility after numerous complications. pain issues currently, cardiology  assisting for cardiac mgt  improve stamina, activity tolerance  volume, nutrition, pain, wound care, diabetic mgt   Bowel/Bladder     Pt is dialysis oliguric, continent of bowel LBM 07/08  continue with dialysis remain continent of bowel with normal bowel function  assist with toileting as needed laxatives prn   Swallow/Nutrition/ Hydration               ADL's     Limited participation due to L knee pain, can complete functional mobility at supervision level using RW when able  Mod I  Functional activity tolerance, ADL re-training, family ed, d/c planning   Mobility     supervision-min assist for gait up to 20 ft, limited by L knee pain  Mod I  LE strengthening, L knee pain  management, endurance, activity tolerance, balance, d/c planning, ed   Communication               Safety/Cognition/ Behavioral Observations             Pain               Skin                 Rehab Goals Patient on target to meet rehab goals: Yes *See Care Plan and progress notes for long and short-term goals.      Barriers to Discharge   Current Status/Progress Possible Resolutions Date Resolved   Physician     Medical stability        see medical progress notes      Nursing                 PT                    OT                 SLP            SW              Discharge Planning/Teaching Needs:  Pt to return home with his sister, [name], who can provide any needed assistance.  Teaching needs TBD   Team Discussion:  Multiple medical issues:  LVAD/HD/GIB/Septic knee.  Slow improvement in strength overlal. Min assist overall with OT.  Very reluctant to beat weight and does not like to wear brace.  Eating very little - MD aware - does hit 50% with most meals.  Has mod ind/ supervision goals.  Home with sister.  Revisions to Treatment Plan:  None    Continued Need for Acute Rehabilitation Level of Care: The patient requires daily medical management by a physician with specialized training in physical medicine and rehabilitation for the following conditions: Daily direction of a multidisciplinary physical rehabilitation program to ensure safe treatment while eliciting the highest outcome that is of practical value to the patient.: Yes Daily medical management of patient stability for increased activity during participation in an intensive rehabilitation regime.: Yes Daily analysis of laboratory values and/or radiology reports with any subsequent need for medication adjustment of medical intervention for : Wound care problems;Cardiac problems   [name] 05/15/2018, 11:22 AM

## 2018-05-15 NOTE — Progress Notes (Signed)
Occupational Therapy Session Note  Patient Details  Name: Trevor [name] MRN: [id number] Date of Birth: 1945-12-07  Today's Date: 05/15/2018 OT Individual Time: 1100-1153 OT Individual Time Calculation (min): 53 min    Short Term Goals: Week 2:  OT Short Term Goal 1 (Week 2): Cont to work towards LTG of supervision-mod I overall  Skilled Therapeutic Interventions/Progress Updates:    Treatment session with focus on activity tolerance and functional mobility in home environment.  Pt reports fatigue but willing to engage in therapy session as tolerated.  Pt propelled w/c to ADL apt without assistance.  Completed furniture transfers and bed mobility in ADL apt to simulate home environment.  Pt completed all transfers at supervision - Mod I level.  Discussed safety in kitchen with pt ambulating through kitchen and opening cabinets and refrigerator to simulate gathering items to fix a snack or breakfast.  Encouraged pt to have sister complete majority of the cooking but he can retreive items from kitchen.  Recommended use of w/c for energy conservation if completing more involved tasks in kitchen.  Discussed LB dressing with pt reporting that he has a sock aid at home, but will require assistance to don TEDS.  Pt required frequent rest breaks throughout session due to tenderness in Lt knee, but no complaints of increased pain with activity.  Pt returned to room and left seated in recliner with legs elevated and all needs in reach.  Therapy Documentation Precautions:  Precautions Precautions: Other (comment), Fall Precaution Comments: LVAD Required Braces or Orthoses: Other Brace/Splint Other Brace/Splint: hinged knee brace per pt comfort Restrictions Weight Bearing Restrictions: No General: General PT Missed Treatment Reason: Patient fatigue Vital Signs: Therapy Vitals BP: 100/65 Patient Position (if appropriate): Sitting Pain: Pain Assessment Pain Scale: 0-10 Pain Score: 4  Pain  Type: Acute pain Pain Location: Knee Pain Orientation: Left Pain Descriptors / Indicators: Aching Pain Frequency: Constant Pain Onset: On-going Pain Intervention(s): Medication (See eMAR)  See Function Navigator for Current Functional Status.   Therapy/Group: Individual Therapy  Simonne Come 05/15/2018, 12:01 PM

## 2018-05-15 NOTE — Progress Notes (Addendum)
[facility] Progress Note   Subjective:   Seen during dilaysis.  Patient states he is feeling better. Strength improving.  Cont to have L knee pain.  Denies SOB, CP, n/v/d.   Objective Vitals:   05/15/18 1330 05/15/18 1340 05/15/18 1415 05/15/18 1445  BP: 120/82 (!) 113/91 110/83 (!) 114/92  Pulse: 78 80 80 72  Resp: 18 17 16 15   Temp: 97.9 F (36.6 C)     TempSrc: Oral     SpO2: 100%     Weight: 72.1 kg (158 lb 15.2 oz)     Height:       Physical Exam General:NAD, chronically ill appearing male Heart:mechanical heart sounds with LVAD hum Lungs:CTAB anteriorly, nml WOB Abdomen:soft, +BS Extremities:2+pitting edema L>R Dialysis Access: LU AVF cannulated   Filed Weights   05/14/18 0650 05/15/18 0621 05/15/18 1330  Weight: 77 kg (169 lb 12.1 oz) 69.9 kg (154 lb 1.6 oz) 72.1 kg (158 lb 15.2 oz)    Intake/Output Summary (Last 24 hours) at 05/15/2018 1453 Last data filed at 05/15/2018 1313 Gross per 24 hour  Intake 600 ml  Output -  Net 600 ml    Additional Objective Labs: Basic Metabolic Panel: Recent Labs  Lab 05/09/18 0731  05/13/18 0615 05/14/18 0655 05/15/18 0614  NA 136   < > 133* 136 138  K 3.6   < > 4.5 3.6 3.9  CL 97*   < > 94* 95* 96*  CO2 31   < > 28 30 27   GLUCOSE 160*   < > 119* 127* 108*  BUN 12   < > 24* 11 23  CREATININE 3.61*   < > 5.23* 3.26* 4.84*  CALCIUM 8.0*   < > 8.1* 8.2* 8.4*  PHOS 2.6  --   --   --   --    < > = values in this interval not displayed.    CBC: Recent Labs  Lab 05/11/18 0537 05/12/18 0537 05/13/18 0615 05/14/18 0655 05/15/18 0614  WBC 8.7 8.2 9.0 10.2 11.1*  HGB 8.5* 9.1* 9.1* 9.7* 9.6*  HCT 28.3* 30.9* 30.7* 31.8* 32.4*  MCV 95.3 97.5 96.8 96.7 96.7  PLT 162 174 196 201 233   Blood Culture    Component Value Date/Time   SDES SYNOVIAL LEFT KNEE 03/26/2018 1338   SPECREQUEST NONE 03/26/2018 1338   CULT  03/26/2018 1338    NO GROWTH 3 DAYS Performed at [facility], [place] [address], [place], [place] [address]    REPTSTATUS 03/29/2018 FINAL 03/26/2018 1338    CBG: Recent Labs  Lab 05/14/18 1112 05/14/18 1653 05/14/18 2154 05/15/18 0645 05/15/18 1206  GLUCAP 222* 164* 156* 106* 156*    Lab Results  Component Value Date   INR 1.95 05/15/2018   INR 2.09 05/14/2018   INR 2.29 05/13/2018   Studies/Results: Dg Chest 2 View  Result Date: 05/14/2018 CLINICAL DATA:  Cough, LVAD present, end-stage renal disease EXAM: CHEST - 2 VIEW COMPARISON:  Chest x-ray of 03/09/2018 and 02/23/2018 FINDINGS: No active infiltrate or effusion is seen. LVAD device is again noted. AICD leads are present. The heart is mildly enlarged. Aortic valve replacement is noted. Median sternotomy sutures are present. IMPRESSION: 1. No active lung disease. 2. LVAD present.  Stable cardiomegaly. 3. AICD leads remain. Electronically Signed   By: Ivar Drape M.D.   On: 05/14/2018 12:52    Medications: . sodium chloride    . sodium chloride     .  atorvastatin  40 mg Oral q1800  . budesonide  0.25 mg Nebulization BID  . busPIRone  5 mg Oral BID  . calcitRIOL  0.5 mcg Oral Q M,W,F  . Chlorhexidine Gluconate Cloth  6 each Topical Q0600  . Chlorhexidine Gluconate Cloth  6 each Topical Q0600  . darbepoetin (ARANESP) injection - DIALYSIS  100 mcg Subcutaneous Q Mon-HD  . feeding supplement (NEPRO CARB STEADY)  237 mL Oral BID WC  . hydrocerin   Topical BID  . insulin aspart  0-15 Units Subcutaneous TID WC  . insulin aspart  0-5 Units Subcutaneous QHS  . insulin aspart  3 Units Subcutaneous TID WC  . insulin glargine  4 Units Subcutaneous Daily  . insulin glargine  6 Units Subcutaneous QHS  . levothyroxine  25 mcg Oral QAC breakfast  . multivitamin  1 tablet Oral QHS  . pantoprazole  40 mg Oral BID  . [START ON 05/31/2018] pantoprazole  40 mg Oral Daily  . senna-docusate  1 tablet Oral BID  . sildenafil  20 mg Oral TID  . traMADol  50 mg Oral BID  . traZODone  25 mg Oral QHS  . warfarin  5 mg  Oral ONCE-1800  . Warfarin - Pharmacist Dosing Inpatient   Does not apply q1800    Dialysis Orders: [facility] (has been inpatient since May 2019) 4.5hr 83 kg (now 70- 74kg w/o battery) 2/2 bath L AVF Hep none   Assessment/Plan: 1. Debility - CIR admission 2. GIB - Hx GI AVMs - s/p multiple procedures - lap enteroscopy @[name]  6/7 - s/p multiple blood transfusions. Hgb now stable. Coumadin resumed. 3. Septic L knee - s/p IV ABX course I&Dx2 4. ICM s/p LVAD - per HF team 5. ESRD - HD MWFS; keep map>60 on HD for LVAD.  HD today, tolerating well with net UFG 2.5L. 6. Volume - Does not appear grossly volume overloaded.  CXR yesterday showed no evidence pulmonary edema. Significant weight loss since initial admit in May, will require new EDW.  Will check post weight today. 7. Anemia of CKD- Hgb currently stable at 9.6.  Cont Aranesp 164mcg q Monday. 8. MBD - Ca ok. Last Phos 2.6. Not on binders, cont VDRA. 9. HTN - BP soft.  10. Nutrition - Renal/Carb modified diet. Renavite. Nepro. 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 Chronic A fib 13. DMT2  Jen Mow, PA-C [facility] Pager: [phone] 05/15/2018,2:53 PM  LOS: 10 days   Pt seen, examined and agree w A/P as above.  Kelly Splinter MD [employer] pager [phone]   05/16/2018, 8:38 AM

## 2018-05-15 NOTE — Progress Notes (Signed)
Occupational Therapy Session Note  Patient Details  Name: Trevor [name] MRN: [id number] Date of Birth: [birth year]  Today's Date: 05/15/2018 OT Individual Time: 2694-8546 OT Individual Time Calculation (min): 60 min    Short Term Goals: Week 1:  OT Short Term Goal 1 (Week 1): STG = LTGs due to ELOS OT Short Term Goal 1 - Progress (Week 1): Progressing toward goal Week 2:  OT Short Term Goal 1 (Week 2): Cont to work towards LTG of supervision-mod I overall Week 3:     Skilled Therapeutic Interventions/Progress Updates:    1:1 Pt in bed when arrived.  Pt reported he bathed previously but able to demonstrate how he performed.  Also reports he has a long handled sponge at home. Pt participated in dressing at EOB with setup and use of a reacher. Pt reports having a reacher and a sock aide at home. Pt with significant edema in bilateral lower LEs (even with LEs elevated all night).  TEDS donned and large socks donned. Pt able to but his LVAD on batteries independently.  BP taken with dynamap and with manual BP cuff/ doppler- all WFL.  Functional ambulation with RW with supervision around the room.  Taken to ADL apartment. Dry run tub bench transfer into tub/ shower with extra time in/out shower. Return to room and transfer into recliner with supervision with RW.  Ice applied to knee until next session.    Therapy Documentation Precautions:  Precautions Precautions: Other (comment), Fall Precaution Comments: LVAD Required Braces or Orthoses: Other Brace/Splint Other Brace/Splint: hinged knee brace per pt comfort Restrictions Weight Bearing Restrictions: No Pain: Ongoing pain in left knee but able to continue with rest breaks as needed   See Function Navigator for Current Functional Status.   Therapy/Group: Individual Therapy  [name] 05/15/2018, 4:11 PM

## 2018-05-15 NOTE — Progress Notes (Signed)
[facility]     PROGRESS NOTE  Subjective/Complaints:  Feeling better this morning. Had ongoing dizziness related to HD/volume yesterday during therapy. Left knee pain remains an issue  ROS: Patient denies fever, rash, sore throat, blurred vision, nausea, vomiting, diarrhea, cough, shortness of breath or chest pain,  headache, or mood change.    Objective: Vital Signs: Blood pressure 101/87, pulse 76, temperature 97.9 F (36.6 C), temperature source Oral, resp. rate 16, height 5\' 5"  (1.651 m), weight 69.9 kg (154 lb 1.6 oz), SpO2 98 %. Dg Chest 2 View  Result Date: 05/14/2018 CLINICAL DATA:  Cough, LVAD present, end-stage renal disease EXAM: CHEST - 2 VIEW COMPARISON:  Chest x-ray of 03/09/2018 and 02/23/2018 FINDINGS: No active infiltrate or effusion is seen. LVAD device is again noted. AICD leads are present. The heart is mildly enlarged. Aortic valve replacement is noted. Median sternotomy sutures are present. IMPRESSION: 1. No active lung disease. 2. LVAD present.  Stable cardiomegaly. 3. AICD leads remain. Electronically Signed   By: Ivar Drape M.D.   On: 05/14/2018 12:52   Recent Labs    05/14/18 0655 05/15/18 0614  WBC 10.2 11.1*  HGB 9.7* 9.6*  HCT 31.8* 32.4*  PLT 201 233   Recent Labs    05/14/18 0655 05/15/18 0614  NA 136 138  K 3.6 3.9  CL 95* 96*  GLUCOSE 127* 108*  BUN 11 23  CREATININE 3.26* 4.84*  CALCIUM 8.2* 8.4*   CBG (last 3)  Recent Labs    05/14/18 1653 05/14/18 2154 05/15/18 0645  GLUCAP 164* 156* 106*    Wt Readings from Last 3 Encounters:  05/15/18 69.9 kg (154 lb 1.6 oz)  05/05/18 74.2 kg (163 lb 9.3 oz)  04/01/18 90.6 kg (199 lb 11.8 oz)    Physical Exam:  BP 101/87   Pulse 76   Temp 97.9 F (36.6 C) (Oral)   Resp 16   Ht 5\' 5"  (1.651 m)   Wt 69.9 kg (154 lb 1.6 oz)   SpO2 98%   BMI 25.64 kg/m  Constitutional: No distress . Vital signs reviewed. HEENT: EOMI, oral membranes moist Neck:  supple Cardiovascular: RRR without murmur. No JVD    Respiratory: CTA Bilaterally without wheezes or rales. Normal effort    GI: BS +, non-tender, non-distended  Musculoskeletal: B/l LE pretibial edema L>R left knee with effusion, hamstrings remain tight/flexion contracture Neurological: He is alert.  Motor: B/l UE: 4+/5 proximal to distal LLE: 2-3/5 proximal to distal. left knee remains limited by pain RLE: HF 4-/5,   ADF 4/5  Skin:  Left knee incision intact BLE with dry flaky skin.  Vascular changes b/l LE Psychiatric: He has a normal mood and affect. His behavior is normal.   Assessment/Plan: 1. Functional deficits secondary to debility which require 3+ hours per day of interdisciplinary therapy in a comprehensive inpatient rehab setting. Physiatrist is providing close team supervision and 24 hour management of active medical problems listed below. Physiatrist and rehab team continue to assess barriers to discharge/monitor patient progress toward functional and medical goals.  Function:  Bathing Bathing position Bathing activity did not occur: Refused Position: Wheelchair/chair at sink  Bathing parts Body parts bathed by patient: Right arm, Left arm, Chest, Abdomen, Right upper leg, Left upper leg Body parts bathed by helper: Right lower leg, Left lower leg, Back  Bathing assist        Upper Body Dressing/Undressing Upper body dressing   What is the  patient wearing?: Hospital gown                Upper body assist Assist Level: Set up   Set up : To obtain clothing/put away  Lower Body Dressing/Undressing Lower body dressing Lower body dressing/undressing activity did not occur: Refused What is the patient wearing?: Underwear, Non-skid slipper socks Underwear - Performed by patient: Thread/unthread right underwear leg, Thread/unthread left underwear leg Underwear - Performed by helper: Pull underwear up/down Pants- Performed by patient: Pull pants up/down Pants-  Performed by helper: Thread/unthread right pants leg, Thread/unthread left pants leg Non-skid slipper socks- Performed by patient: Don/doff right sock, Don/doff left sock Non-skid slipper socks- Performed by helper: Don/doff right sock, Don/doff left sock                  Lower body assist Assist for lower body dressing: (total/substantial assist)      Toileting Toileting Toileting activity did not occur: No continent bowel/bladder event Toileting steps completed by patient: Adjust clothing prior to toileting, Performs perineal hygiene, Adjust clothing after toileting Toileting steps completed by helper: Performs perineal hygiene Toileting Assistive Devices: Grab bar or rail  Toileting assist Assist level: Touching or steadying assistance (Pt.75%)   Transfers Chair/bed transfer   Chair/bed transfer method: Stand pivot Chair/bed transfer assist level: Supervision or verbal cues Chair/bed transfer assistive device: Environmental consultant, Air cabin crew     Max distance: 30 Assist level: Supervision or verbal cues   Wheelchair   Type: Manual Max wheelchair distance: 241ft  Assist Level: Supervision or verbal cues  Cognition Comprehension Comprehension assist level: Follows complex conversation/direction with extra time/assistive device  Expression Expression assist level: Expresses complex ideas: With extra time/assistive device  Social Interaction Social Interaction assist level: Interacts appropriately with others with medication or extra time (anti-anxiety, antidepressant).  Problem Solving Problem solving assist level: Solves complex problems: With extra time  Memory Memory assist level: Recognizes or recalls 50 - 74% of the time/requires cueing 25 - 49% of the time    Medical Problem List and Plan: 1.  Deficits with mobility, self-care secondary to LVAD as well as left knee infection/aspiration.  Continue CIR.   -knee brace with therapies 2.  DVT  Prophylaxis/Anticoagulation: Pharmaceutical: Coumadin. Being followed closely by pharmacy.   INR subtherapeutic 7/12 3. Pain Management: continue oxycodone and/or ultram for pain. Monitor for signs of overdose as HD dependent.  4. Mood: team to provide ego support. LCSW to follow for evaluation and support.  5. Neuropsych: This patient appears capable of making decisions on his own behalf. 6. Skin/Wound Care: routine pressure relief measures.  7. Fluids/Electrolytes/Nutrition: Renal diet --decrease fluid restriction to 1200.Marland Kitchen  Monitor weights daily. Strict I/O.  8. GIB due to AVMs with ABLA: On Protonix BID. Continue to monitor stools for melena/signs of bleeding. Augment bowel program as needed  Hemoglobin 9.6 on 7/12  Continue to monitor 9. CAD s/p CABG/ICM--s/p LVAD: Strict I/O with daily weights. LVAD self test daily. Monitor LVAD per protocol Filed Weights   05/13/18 1930 05/14/18 0650 05/15/18 0621  Weight: 70.3 kg (154 lb 15.7 oz) 77 kg (169 lb 12.1 oz) 69.9 kg (154 lb 1.6 oz)   -holding around 70kg,   -volume mgt per cards/renal 10 T2DM:  Pt on lantus 8u qhs at home  -added lantus 5u qhs  ---decrease to 6u QHS 7/10   - Continue scheduled novolog 3 units with meals for now and moderate SSI.   -sugars showing improvement   -  will add low dose am lantus as well to better control PM #s 4u 11. Septic Knee: Has completed antibiotic course on 05/04/18  -continue using knee brace when up  -scheduled ice for knee  -pain medication prior to activity 12. ESRD: HD MWFSa to help with fluid overload/anasarca.  13. Sleep disturbance: Scheduled trazodone as at home.   14. Anxiety disorder: continue Buspar tid.  15. Leukocytosis  WBCs 11.1 7/12  Afebrile  Continue to monitor 16. Hypotension  Asymptomatic at present  -HD shortened last night  LOS (Days) 10 A FACE TO FACE EVALUATION WAS PERFORMED  Meredith Staggers 05/15/2018 8:32 AM

## 2018-05-16 ENCOUNTER — Inpatient Hospital Stay (HOSPITAL_COMMUNITY): Payer: Medicare Other | Admitting: Physical Therapy

## 2018-05-16 LAB — BASIC METABOLIC PANEL
Anion gap: 10 (ref 5–15)
BUN: 9 mg/dL (ref 8–23)
CALCIUM: 8.1 mg/dL — AB (ref 8.9–10.3)
CO2: 31 mmol/L (ref 22–32)
CREATININE: 3.33 mg/dL — AB (ref 0.61–1.24)
Chloride: 95 mmol/L — ABNORMAL LOW (ref 98–111)
GFR, EST AFRICAN AMERICAN: 20 mL/min — AB (ref >60)
GFR, EST NON AFRICAN AMERICAN: 17 mL/min — AB (ref >60)
GLUCOSE: 130 mg/dL — AB (ref 70–99)
Potassium: 4.3 mmol/L (ref 3.5–5.1)
Sodium: 136 mmol/L (ref 135–145)

## 2018-05-16 LAB — GLUCOSE, CAPILLARY
GLUCOSE-CAPILLARY: 118 mg/dL — AB (ref 70–99)
GLUCOSE-CAPILLARY: 224 mg/dL — AB (ref 70–99)
Glucose-Capillary: 224 mg/dL — ABNORMAL HIGH (ref 70–99)
Glucose-Capillary: 215 mg/dL — ABNORMAL HIGH (ref 70–99)
Glucose-Capillary: 250 mg/dL — ABNORMAL HIGH (ref 70–99)

## 2018-05-16 LAB — CBC
HCT: 29.7 % — ABNORMAL LOW (ref 39.0–52.0)
Hemoglobin: 8.8 g/dL — ABNORMAL LOW (ref 13.0–17.0)
MCH: 28.6 pg (ref 26.0–34.0)
MCHC: 29.6 g/dL — AB (ref 30.0–36.0)
MCV: 96.4 fL (ref 78.0–100.0)
PLATELETS: 192 10*3/uL (ref 150–400)
RBC: 3.08 MIL/uL — ABNORMAL LOW (ref 4.22–5.81)
RDW: 19.3 % — ABNORMAL HIGH (ref 11.5–15.5)
WBC: 9.9 10*3/uL (ref 4.0–10.5)

## 2018-05-16 LAB — LACTATE DEHYDROGENASE: LDH: 198 U/L — AB (ref 98–192)

## 2018-05-16 LAB — PROTIME-INR
INR: 2.43
PROTHROMBIN TIME: 26.2 s — AB (ref 11.4–15.2)

## 2018-05-16 MED ORDER — SODIUM CHLORIDE 0.9 % IV BOLUS
250.0000 mL | Freq: Once | INTRAVENOUS | Status: AC
  Administered 2018-05-16: 250 mL via INTRAVENOUS

## 2018-05-16 NOTE — Progress Notes (Addendum)
[facility] Progress Note   Subjective:   Sitting on bedside.  Reports dizziness with standing, productive cough and weakness this AM.  BS dropped this morning and last night.  Denies CP, SOB, and n/v/d.  Objective Vitals:   05/16/18 0805 05/16/18 1028 05/16/18 1058 05/16/18 1229  BP:  (!) 83/64  (!) 87/71  Pulse:    72  Resp:      Temp:      TempSrc:      SpO2: 94%  92%   Weight:      Height:       Physical Exam General:NAD, chronically ill appearing elderly male Heart:+LVAD hum, +mechanical heart sounds Lungs:+wheezes throughout, nml WOB Abdomen:soft, NTND Extremities:2+ LE edema Dialysis Access: LU AVF  Filed Weights   05/15/18 1735 05/16/18 0500 05/16/18 0643  Weight: 70 kg (154 lb 5.2 oz) 71.2 kg (156 lb 15.5 oz) 72.1 kg (158 lb 15.2 oz)    Intake/Output Summary (Last 24 hours) at 05/16/2018 1240 Last data filed at 05/15/2018 1735 Gross per 24 hour  Intake 240 ml  Output 1911 ml  Net -1671 ml    Additional Objective Labs: Basic Metabolic Panel: Recent Labs  Lab 05/15/18 0614 05/15/18 2054 05/16/18 0518  NA 138 136 136  K 3.9 4.4 4.3  CL 96* 96* 95*  CO2 27 30 31   GLUCOSE 108* 201* 130*  BUN 23 6* 9  CREATININE 4.84* 2.48* 3.33*  CALCIUM 8.4* 8.0* 8.1*   Liver Function Tests: Recent Labs  Lab 05/15/18 2054  AST 32  ALT 12  ALKPHOS 101  BILITOT 0.7  PROT 6.8  ALBUMIN 2.7*   CBC: Recent Labs  Lab 05/12/18 0537 05/13/18 0615 05/14/18 0655 05/15/18 0614 05/16/18 0518  WBC 8.2 9.0 10.2 11.1* 9.9  HGB 9.1* 9.1* 9.7* 9.6* 8.8*  HCT 30.9* 30.7* 31.8* 32.4* 29.7*  MCV 97.5 96.8 96.7 96.7 96.4  PLT 174 196 201 233 192   Blood Culture    Component Value Date/Time   SDES SYNOVIAL LEFT KNEE 03/26/2018 1338   SPECREQUEST NONE 03/26/2018 1338   CULT  03/26/2018 1338    NO GROWTH 3 DAYS Performed at [facility], [place] [address] [place], [place] [address]    REPTSTATUS 03/29/2018 FINAL 03/26/2018 1338    CBG: Recent  Labs  Lab 05/15/18 1916 05/15/18 1958 05/16/18 0612 05/16/18 1039 05/16/18 1153  GLUCAP 97 167* 118* 224* 224*    Lab Results  Component Value Date   INR 2.43 05/16/2018   INR 1.95 05/15/2018   INR 2.09 05/14/2018   Studies/Results: No results found.  Medications:  . atorvastatin  40 mg Oral q1800  . budesonide  0.25 mg Nebulization BID  . busPIRone  5 mg Oral BID  . calcitRIOL  0.5 mcg Oral Q M,W,F  . Chlorhexidine Gluconate Cloth  6 each Topical Q0600  . Chlorhexidine Gluconate Cloth  6 each Topical Q0600  . darbepoetin (ARANESP) injection - DIALYSIS  100 mcg Subcutaneous Q Mon-HD  . feeding supplement (NEPRO CARB STEADY)  237 mL Oral BID WC  . hydrocerin   Topical BID  . insulin aspart  0-15 Units Subcutaneous TID WC  . insulin aspart  0-5 Units Subcutaneous QHS  . insulin aspart  3 Units Subcutaneous TID WC  . insulin glargine  4 Units Subcutaneous Daily  . insulin glargine  6 Units Subcutaneous QHS  . levothyroxine  25 mcg Oral QAC breakfast  . multivitamin  1 tablet Oral QHS  .  pantoprazole  40 mg Oral BID  . [START ON 05/31/2018] pantoprazole  40 mg Oral Daily  . senna-docusate  1 tablet Oral BID  . sildenafil  20 mg Oral TID  . traMADol  50 mg Oral BID  . traZODone  25 mg Oral QHS  . Warfarin - Pharmacist Dosing Inpatient   Does not apply q1800    Dialysis Orders: [facility] (has been inpatient since May 2019) 4.5hr 83 kg (now 70- 74kgw/o battery) 2/2 bath L AVF Hep none   Assessment/Plan: 1. Debility - CIR admission 2. GIB - Hx GI AVMs - s/p multiple procedures - lap enteroscopy @[name]  6/7 - s/p multiple blood transfusions. Hgb 9.6>8.8. Coumadin resumed. 3. Septic L knee - s/p IV ABX course I&Dx2 4. ICM s/p LVAD - per HF team 5. ESRD - HD MWFS; keep map>60 on HD for LVAD.  HD yesterday, tolerated well with net UF 1.9L removed. No HD today due to hypotension and lack of volume overload.  Resume regular schedule on Monday.  K 4.3.  Pt would like BS's  checked during HD, will attempt to add this to his HD orders (late in HD around 3 hrs).   6. Volume - Does not appear grossly volume overloaded. Continues to have LE edema but with symptomatic hypotension today.  CXR 7/11 showed no evidence pulmonary edema. Significant weight loss since initial admit in May, will require new EDW. Post weight yesterday 70kg.   7. Anemia of CKD- Hgb 9.6>8.8, cont to follow.  Cont Aranesp 170mcg q Monday. 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 Last Phos 2.6. Not on binders, cont VDRA. 9. HTN - BP soft. Not on antihypertensives.  10. Nutrition - Diet liberalized. Renavite. Nepro. 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 Chronic A fib 13. DMT2    Jen Mow, PA-C [facility] Pager: [phone] 05/16/2018,12:40 PM  LOS: 11 days   Pt seen, examined, agree w assess/plan as above with additions as indicated.  Kelly Splinter MD [employer] pager [phone]    cell [phone] 05/16/2018, 1:23 PM

## 2018-05-16 NOTE — Progress Notes (Signed)
ANTICOAGULATION CONSULT NOTE  Pharmacy Consult for warfarin Indication: LVAD   Patient Measurements: Height: 5\' 5"  (165.1 cm) Weight: 158 lb 15.2 oz (72.1 kg) IBW/kg (Calculated) : 61.5 Heparin Dosing Weight: 79.5 kg  Vital Signs: Temp: 98 F (36.7 C) (07/13 0607) Temp Source: Oral (07/13 0607) BP: 89/76 (07/13 0643) Pulse Rate: 94 (07/13 0607)  Labs: Recent Labs    05/14/18 0655 05/15/18 0614 05/15/18 2054 05/16/18 0518  HGB 9.7* 9.6*  --  8.8*  HCT 31.8* 32.4*  --  29.7*  PLT 201 233  --  192  LABPROT 23.3* 22.1*  --  26.2*  INR 2.09 1.95  --  2.43  CREATININE 3.26* 4.84* 2.48* 3.33*    Medical History: Past Medical History:  Diagnosis Date  . AICD (automatic cardioverter/defibrillator) present   . Asthma   . AVM (arteriovenous malformation) of colon 07/07/2017  . CHF (congestive heart failure) ([place])   . Diabetes ([place])   . ESRF (end stage renal failure) ([place]) 07/07/2017   pt receiving hemodialysis Monday- Wednesday-friday, sometimes Saturday  . Kidney disease   . LVAD (left ventricular assist device) present ([place]) 12/2016  . Permanent atrial fibrillation ([place]) 07/07/2017  . Presence of permanent cardiac pacemaker    AICD  . Sleep apnea     Assessment: 70 yom who underwent HM2 LVAD implantation on 12/14/2015. Has undergoing extensive GI workup to evaluate bleeding. Was stopped on anticoagulants as of admission on 03/09/2018. Last outpatient regimen was (as of 02/17/2018): 7.5 mg daily except 10 mg on Tues/Thurs for goal 2-2.5. Goal INR was decreased to 1.8-2.3 after admission on 02/28/2018. Now s/p laparoscopic surgical enteroscopy with 4 clips on 04/10/2018.  Heparin and warfarin were resumed on 6/27 >> now off heparin infusion. Patient had only received two doses of warfarin prior to INR unexpectedly jumping (peaked at 4.71) previously - have been dosing lower than previous home regimen since.   Today, 05/16/2018: - INR increased noticeable from 1.95 to 2.43 today -  hgb down 8.8, plts down 1.92 - no bleeding documented - no signif. Drug-drug intxns noted - received HD on 7/12  Goal of Therapy:  INR goal: 1.8-2.2 Monitor platelets by anticoagulation protocol: Yes   Plan:  - Hold Warfarin dose today d/t supra-therapeutic INR -Daily INR/CBC  -Monitor s/sx bleeding  Dia Sitter, PharmD, BCPS 05/16/2018 9:23 AM

## 2018-05-16 NOTE — Progress Notes (Signed)
Patient ID: Trevor [name], male   DOB: Aug 11, 1946, 72 y.o.   MRN: [id number]   Advanced Heart Failure VAD Team Note  Subjective:    Cramps at the end of HD yesterday.  Today, was lightheaded when he got out of bed.  MAP 70-80, afebrile, hgb 8.8.  No overt GI bleeding.   LVAD Interrogation HM 2: Speed: 9200 Flow: 5.6 PI: 4.8 Power: 4.6 - VAD interrogated personally. Parameters stable.    Objective:    Vital Signs:   Temp:  [97.4 F (36.3 C)-98.1 F (36.7 C)] 98 F (36.7 C) (07/13 0607) Pulse Rate:  [72-94] 94 (07/13 0607) Resp:  [12-20] 20 (07/13 0607) BP: (83-120)/(64-92) 83/64 (07/13 1028) SpO2:  [92 %-100 %] 92 % (07/13 1058) Weight:  [154 lb 5.2 oz (70 kg)-158 lb 15.2 oz (72.1 kg)] 158 lb 15.2 oz (72.1 kg) (07/13 0643) Last BM Date: 05/15/18 Mean arterial Pressure 70-80  Intake/Output:   Intake/Output Summary (Last 24 hours) at 05/16/2018 1123 Last data filed at 05/15/2018 1735 Gross per 24 hour  Intake 240 ml  Output 1911 ml  Net -1671 ml     Physical Exam    General: Well appearing this am. NAD.  HEENT: Normal. Neck: Supple, JVP 8 cm. Carotids OK.  Cardiac:  Mechanical heart sounds with LVAD hum present.  Lungs:  CTAB, normal effort.  Abdomen:  NT, ND, no HSM. No bruits or masses. +BS  LVAD exit site: Well-healed and incorporated. Dressing dry and intact. No erythema or drainage. Stabilization device present and accurately applied. Driveline dressing changed daily per sterile technique. Extremities:  Warm and dry. No cyanosis, clubbing, rash, 1+ ankle edema.  Neuro:  Alert & oriented x 3. Cranial nerves grossly intact. Moves all 4 extremities w/o difficulty. Affect pleasant     Telemetry   N/A  EKG    No new tracings.    Labs   Basic Metabolic Panel: Recent Labs  Lab 05/13/18 0615 05/14/18 0655 05/15/18 0614 05/15/18 2054 05/16/18 0518  NA 133* 136 138 136 136  K 4.5 3.6 3.9 4.4 4.3  CL 94* 95* 96* 96* 95*  CO2 28 30 27 30 31   GLUCOSE 119*  127* 108* 201* 130*  BUN 24* 11 23 6* 9  CREATININE 5.23* 3.26* 4.84* 2.48* 3.33*  CALCIUM 8.1* 8.2* 8.4* 8.0* 8.1*    Liver Function Tests: Recent Labs  Lab 05/15/18 2054  AST 32  ALT 12  ALKPHOS 101  BILITOT 0.7  PROT 6.8  ALBUMIN 2.7*   No results for input(s): LIPASE, AMYLASE in the last 168 hours. No results for input(s): AMMONIA in the last 168 hours.  CBC: Recent Labs  Lab 05/12/18 0537 05/13/18 0615 05/14/18 0655 05/15/18 0614 05/16/18 0518  WBC 8.2 9.0 10.2 11.1* 9.9  HGB 9.1* 9.1* 9.7* 9.6* 8.8*  HCT 30.9* 30.7* 31.8* 32.4* 29.7*  MCV 97.5 96.8 96.7 96.7 96.4  PLT 174 196 201 233 192    INR: Recent Labs  Lab 05/12/18 0537 05/13/18 0615 05/14/18 0655 05/15/18 0614 05/16/18 0518  INR 1.90 2.29 2.09 1.95 2.43    Other results:  EKG:    Imaging   Dg Chest 2 View  Result Date: 05/14/2018 CLINICAL DATA:  Cough, LVAD present, end-stage renal disease EXAM: CHEST - 2 VIEW COMPARISON:  Chest x-ray of 03/09/2018 and 02/23/2018 FINDINGS: No active infiltrate or effusion is seen. LVAD device is again noted. AICD leads are present. The heart is mildly enlarged. Aortic valve replacement is  noted. Median sternotomy sutures are present. IMPRESSION: 1. No active lung disease. 2. LVAD present.  Stable cardiomegaly. 3. AICD leads remain. Electronically Signed   By: Ivar Drape M.D.   On: 05/14/2018 12:52     Medications:     Scheduled Medications: . atorvastatin  40 mg Oral q1800  . budesonide  0.25 mg Nebulization BID  . busPIRone  5 mg Oral BID  . calcitRIOL  0.5 mcg Oral Q M,W,F  . Chlorhexidine Gluconate Cloth  6 each Topical Q0600  . Chlorhexidine Gluconate Cloth  6 each Topical Q0600  . darbepoetin (ARANESP) injection - DIALYSIS  100 mcg Subcutaneous Q Mon-HD  . feeding supplement (NEPRO CARB STEADY)  237 mL Oral BID WC  . hydrocerin   Topical BID  . insulin aspart  0-15 Units Subcutaneous TID WC  . insulin aspart  0-5 Units Subcutaneous QHS  .  insulin aspart  3 Units Subcutaneous TID WC  . insulin glargine  4 Units Subcutaneous Daily  . insulin glargine  6 Units Subcutaneous QHS  . levothyroxine  25 mcg Oral QAC breakfast  . multivitamin  1 tablet Oral QHS  . pantoprazole  40 mg Oral BID  . [START ON 05/31/2018] pantoprazole  40 mg Oral Daily  . senna-docusate  1 tablet Oral BID  . sildenafil  20 mg Oral TID  . traMADol  50 mg Oral BID  . traZODone  25 mg Oral QHS  . Warfarin - Pharmacist Dosing Inpatient   Does not apply q1800    Infusions: . sodium chloride 250 mL (05/16/18 1109)    PRN Medications: acetaminophen, albuterol, aluminum hydroxide, benzonatate, bisacodyl, diphenhydrAMINE, guaiFENesin-dextromethorphan, ondansetron (ZOFRAN) IV, oxyCODONE, polyethylene glycol, traMADol   Patient Profile   Trevor Cordrey Murphyis a 72 y.o.malewith a history of CAD s/p CABG x 4 2010, OSA, AS with TAVR 2015, ,ESRD on HD,DM2,chronicatrial fibrillation, asthma,GI bleed,and ischemic cardiomyopathy with Medtronic ICD.S/PHMII LVAD for Destination therapy on 12/14/2015.  Transferred back from [place] on 6/27 after undergoing enterotomy and clipping of a colonic Dueilafoy lesion as well I&D of septic left knee.  Assessment/Plan:    1. GI Bleed/Symptomatic Anemia:GI AVMs. Double balloon retro endoscopy at [facility], unable to identify bleeding lesion. He failed attempted IR embolization x 3 while at [facility]. 12 units PRBCs at [facility]. Received 15 uPRBC while at [facility]. IR consulted, but did not find any active bleeding on CTA 5/20. Repeat capsule study showed active bleeding in the mid ileum.Surgery was reconsulted and recommended transfer back to [facility] for possible surgery.Now s/p laparoscopic surgical enteroscopy with 4 clips on 6/7 at [facility], c/b ischemic bowel/SBO. Now tolerating full diet if he eats in small quantities. No evidence currently for overt bleeding. Hgb 8.8 this morning.  - Tolerating warfarin. Goal INR 1.8-2.3, adjust to keep in this  range.  No ASA.  - Hgb 8.8 this am. No bleeding.  - Getting Aranesp per nephrology.  - He will continue octreotide as outpatient.  2. 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 S/pHM II LVAD placed 2020for DT.Has Medtronic ICD. Volume controlled by HD. MWF/Sat schedule.  VAD interrogated personally. Parameters stable.  LDH stable 190.  He was lightheaded this morning, may be from fluid shifts with HD yesterday (also cramped on HD).  MAP stable.  - I encouraged him to eat a good lunch and drink plenty of fluid today.  Suspect LH will resolve.  -Continue sildenafil 20 mg tid. - VAD interrogated personally. Parameters stable.   4. ESRD- M/W/F/Sa: HD  per Renal. Continue MWF/Sa schedule but will not have HD today.  5. Chronic Afib: Rate controlled.  - On coumadin.  6. HTN: MAP stable.   7.Septicleftknee: s/pseveral aspirations and OR wash out x2. Aspirate cultures positive for staph epi and clostridium perfingens. Blood cultures negative at [facility]. Finished Ancef 7/2.  Ortho saw 6/28  Stitches removed 05/05/18 8. DM II -SSI. No change.  9.Hx ofCAD s/pCABG. - Continue statin 10. Hx ofTAVR:Valvestable on last echo2/2019. No change.  11. Deconditioning - Appreciate CIR care - No change to current plan.   12. Insomnia: Improved with limiting interruptions in night - Trazadone has for him worked in the past. Continue PRN.  - No change to current plan.    I reviewed the LVAD parameters from today, and compared the results to the patient's prior recorded data.  No programming changes were made.  The LVAD is functioning within specified parameters.  The patient performs LVAD self-test daily.  LVAD interrogation was negative for any significant power changes, alarms or PI events/speed drops.  LVAD equipment check completed and is in good working order.  Back-up equipment present.   LVAD education done on emergency procedures and precautions and reviewed exit site care.    Length of Stay: 31  Loralie Champagne, MD 05/16/2018, 11:23 AM  VAD Team --- VAD ISSUES ONLY--- Pager [phone] (7am - 7am)  Advanced Heart Failure Team  Pager [phone] (M-F; 7a - 4p)  Please contact [facility] for night-coverage after hours (4p -7a ) and weekends on [email or web address]

## 2018-05-16 NOTE — Significant Event (Signed)
Pt refused scheduled Lantus injection stating his CBG has "already been too low today"; medication education provided; will continue to monitor

## 2018-05-16 NOTE — Progress Notes (Signed)
Patient complained of dizziness and lightheadedness MAP 70 BP 83/64 per CN. cbg 224; Dr. Naaman Plummer notified new order for 250cc bolus and  will notify Cardiology. Patient requested for breathing treatment as well.

## 2018-05-16 NOTE — Progress Notes (Signed)
[place] PHYSICAL MEDICINE & REHABILITATION     PROGRESS NOTE  Subjective/Complaints:  Therapy went well yesterday. cbg dropped during HD--"why isn't anyone checking it during HD?". Asked why labs were drawn post-HD yesterday (CMET)  ROS: Patient denies fever, rash, sore throat, blurred vision, nausea, vomiting, diarrhea, cough, shortness of breath or chest pain, joint or back pain, headache, or mood change.    Objective: Vital Signs: Blood pressure (!) 89/76, pulse 94, temperature 98 F (36.7 C), temperature source Oral, resp. rate 20, height 5\' 5"  (1.651 m), weight 72.1 kg (158 lb 15.2 oz), SpO2 94 %. Dg Chest 2 View  Result Date: 05/14/2018 CLINICAL DATA:  Cough, LVAD present, end-stage renal disease EXAM: CHEST - 2 VIEW COMPARISON:  Chest x-ray of 03/09/2018 and 02/23/2018 FINDINGS: No active infiltrate or effusion is seen. LVAD device is again noted. AICD leads are present. The heart is mildly enlarged. Aortic valve replacement is noted. Median sternotomy sutures are present. IMPRESSION: 1. No active lung disease. 2. LVAD present.  Stable cardiomegaly. 3. AICD leads remain. Electronically Signed   By: Ivar Drape M.D.   On: 05/14/2018 12:52   Recent Labs    05/15/18 0614 05/16/18 0518  WBC 11.1* 9.9  HGB 9.6* 8.8*  HCT 32.4* 29.7*  PLT 233 192   Recent Labs    05/15/18 2054 05/16/18 0518  NA 136 136  K 4.4 4.3  CL 96* 95*  GLUCOSE 201* 130*  BUN 6* 9  CREATININE 2.48* 3.33*  CALCIUM 8.0* 8.1*   CBG (last 3)  Recent Labs    05/15/18 1916 05/15/18 1958 05/16/18 0612  GLUCAP 97 167* 118*    Wt Readings from Last 3 Encounters:  05/16/18 72.1 kg (158 lb 15.2 oz)  05/05/18 74.2 kg (163 lb 9.3 oz)  04/01/18 90.6 kg (199 lb 11.8 oz)    Physical Exam:  BP (!) 89/76   Pulse 94   Temp 98 F (36.7 C) (Oral)   Resp 20   Ht 5\' 5"  (1.651 m)   Wt 72.1 kg (158 lb 15.2 oz)   SpO2 94%   BMI 26.45 kg/m  Constitutional: No distress . Vital signs reviewed. HEENT:  EOMI, oral membranes moist Neck: supple Cardiovascular: hum Respiratory: CTA Bilaterally without wheezes or rales. Normal effort    GI: BS +, non-tender, non-distended  Musculoskeletal: B/l LE pretibial edema L>R left knee with effusion, hamstrings remain tight/flexion contracture--stable Neurological: He is alert.  Motor: B/l UE: 4+/5 proximal to distal LLE: 2-3/5 proximal to distal. left knee remains limited by pain RLE: HF 4-/5,   ADF 4/5  Skin:  Left knee incision intact BLE with dry flaky skin.  Vascular changes b/l LE Psychiatric: He has a normal mood and affect. His behavior is normal.   Assessment/Plan: 1. Functional deficits secondary to debility which require 3+ hours per day of interdisciplinary therapy in a comprehensive inpatient rehab setting. Physiatrist is providing close team supervision and 24 hour management of active medical problems listed below. Physiatrist and rehab team continue to assess barriers to discharge/monitor patient progress toward functional and medical goals.  Function:  Bathing Bathing position Bathing activity did not occur: Refused Position: Wheelchair/chair at sink  Bathing parts Body parts bathed by patient: Right arm, Left arm, Chest, Abdomen, Right upper leg, Left upper leg Body parts bathed by helper: Right lower leg, Left lower leg, Back  Bathing assist        Upper Body Dressing/Undressing Upper body dressing   What is  the patient wearing?: Hospital gown                Upper body assist Assist Level: Set up   Set up : To obtain clothing/put away  Lower Body Dressing/Undressing Lower body dressing Lower body dressing/undressing activity did not occur: Refused What is the patient wearing?: Underwear, Non-skid slipper socks Underwear - Performed by patient: Thread/unthread right underwear leg, Thread/unthread left underwear leg Underwear - Performed by helper: Pull underwear up/down Pants- Performed by patient: Pull pants  up/down Pants- Performed by helper: Thread/unthread right pants leg, Thread/unthread left pants leg Non-skid slipper socks- Performed by patient: Don/doff right sock, Don/doff left sock Non-skid slipper socks- Performed by helper: Don/doff right sock, Don/doff left sock                  Lower body assist Assist for lower body dressing: (total/substantial assist)      Toileting Toileting Toileting activity did not occur: No continent bowel/bladder event Toileting steps completed by patient: Adjust clothing prior to toileting, Performs perineal hygiene, Adjust clothing after toileting Toileting steps completed by helper: Performs perineal hygiene Toileting Assistive Devices: Grab bar or rail  Toileting assist Assist level: Touching or steadying assistance (Pt.75%)   Transfers Chair/bed transfer   Chair/bed transfer method: Stand pivot Chair/bed transfer assist level: Supervision or verbal cues Chair/bed transfer assistive device: Environmental consultant, Air cabin crew     Max distance: 30 Assist level: Supervision or verbal cues   Wheelchair   Type: Manual Max wheelchair distance: 286ft  Assist Level: Supervision or verbal cues  Cognition Comprehension Comprehension assist level: Follows complex conversation/direction with extra time/assistive device  Expression Expression assist level: Expresses complex ideas: With extra time/assistive device  Social Interaction Social Interaction assist level: Interacts appropriately with others with medication or extra time (anti-anxiety, antidepressant).  Problem Solving Problem solving assist level: Solves complex problems: With extra time  Memory Memory assist level: Recognizes or recalls 50 - 74% of the time/requires cueing 25 - 49% of the time    Medical Problem List and Plan: 1.  Deficits with mobility, self-care secondary to LVAD as well as left knee infection/aspiration.  Continue CIR.   -knee brace with therapies 2.   DVT Prophylaxis/Anticoagulation: Pharmaceutical: Coumadin. Being followed closely by pharmacy.   INR therapeutic 7/13 3. Pain Management: continue oxycodone and/or ultram for pain. Monitor for signs of overdose as HD dependent.  4. Mood: team to provide ego support. LCSW to follow for evaluation and support.  5. Neuropsych: This patient appears capable of making decisions on his own behalf. 6. Skin/Wound Care: routine pressure relief measures.  7. Fluids/Electrolytes/Nutrition: Renal diet --decrease fluid restriction to 1200.Marland Kitchen  Monitor weights daily. Strict I/O.  8. GIB due to AVMs with ABLA: On Protonix BID. Continue to monitor stools for melena/signs of bleeding. Augment bowel program as needed  Hemoglobin 8.8 on 7/13  Continue to monitor 9. CAD s/p CABG/ICM--s/p LVAD: Strict I/O with daily weights. LVAD self test daily. Monitor LVAD per protocol Filed Weights   05/15/18 1735 05/16/18 0500 05/16/18 0643  Weight: 70 kg (154 lb 5.2 oz) 71.2 kg (156 lb 15.5 oz) 72.1 kg (158 lb 15.2 oz)   -holding around 70kg,   -volume mgt per cards/renal 10 T2DM:  Pt on lantus 8u qhs at home  -   - Continue scheduled novolog 3 units with meals for now and moderate SSI.   -sugars showing improvement   -lantus 4u qam and 6 u  qpm. Discussed reasoning behind schedule  -suggested having a snack during HD if sugars tend to drop near end 11. Septic Knee: Has completed antibiotic course on 05/04/18  -continue using knee brace when up  -scheduled ice for knee  -pain medication prior to activity 12. ESRD: HD MWFSa to help with fluid overload/anasarca.  -volume mgt per nephrology 13. Sleep disturbance: Scheduled trazodone as at home.   14. Anxiety disorder: continue Buspar tid.  15. Leukocytosis  WBCs 9.9 7/13  Afebrile  Continue to monitor    LOS (Days) 11 A FACE TO FACE EVALUATION WAS PERFORMED  Meredith Staggers 05/16/2018 8:41 AM

## 2018-05-16 NOTE — Progress Notes (Addendum)
   05/16/18 1229  Vitals  BP (!) 87/71  MAP (mmHg) 78  Doppler Pressure 85  BP Location Right Arm  BP Method Automatic  Patient Position (if appropriate) Sitting  Pulse Rate 72  MAP 85 Patient claims he feels 50% much better; Less dizzy and light headed. Patient able to eat lunch. Refuses Lantus and meal coverage. [id number].

## 2018-05-17 ENCOUNTER — Inpatient Hospital Stay (HOSPITAL_COMMUNITY): Payer: Medicare Other | Admitting: Physical Therapy

## 2018-05-17 ENCOUNTER — Inpatient Hospital Stay (HOSPITAL_COMMUNITY): Payer: Medicare Other | Admitting: Occupational Therapy

## 2018-05-17 LAB — BASIC METABOLIC PANEL
ANION GAP: 12 (ref 5–15)
BUN: 20 mg/dL (ref 8–23)
CO2: 28 mmol/L (ref 22–32)
Calcium: 8 mg/dL — ABNORMAL LOW (ref 8.9–10.3)
Chloride: 95 mmol/L — ABNORMAL LOW (ref 98–111)
Creatinine, Ser: 4.44 mg/dL — ABNORMAL HIGH (ref 0.61–1.24)
GFR, EST AFRICAN AMERICAN: 14 mL/min — AB (ref >60)
GFR, EST NON AFRICAN AMERICAN: 12 mL/min — AB (ref >60)
GLUCOSE: 147 mg/dL — AB (ref 70–99)
POTASSIUM: 4.1 mmol/L (ref 3.5–5.1)
Sodium: 135 mmol/L (ref 135–145)

## 2018-05-17 LAB — CBC
HEMATOCRIT: 29.1 % — AB (ref 39.0–52.0)
HEMOGLOBIN: 8.6 g/dL — AB (ref 13.0–17.0)
MCH: 28.6 pg (ref 26.0–34.0)
MCHC: 29.6 g/dL — AB (ref 30.0–36.0)
MCV: 96.7 fL (ref 78.0–100.0)
Platelets: 197 10*3/uL (ref 150–400)
RBC: 3.01 MIL/uL — AB (ref 4.22–5.81)
RDW: 19.3 % — ABNORMAL HIGH (ref 11.5–15.5)
WBC: 10.2 10*3/uL (ref 4.0–10.5)

## 2018-05-17 LAB — LACTATE DEHYDROGENASE: LDH: 203 U/L — AB (ref 98–192)

## 2018-05-17 LAB — GLUCOSE, CAPILLARY
GLUCOSE-CAPILLARY: 147 mg/dL — AB (ref 70–99)
GLUCOSE-CAPILLARY: 138 mg/dL — AB (ref 70–99)
GLUCOSE-CAPILLARY: 237 mg/dL — AB (ref 70–99)
Glucose-Capillary: 146 mg/dL — ABNORMAL HIGH (ref 70–99)

## 2018-05-17 LAB — PROTIME-INR
INR: 2.55
PROTHROMBIN TIME: 27.2 s — AB (ref 11.4–15.2)

## 2018-05-17 LAB — PHOSPHORUS: PHOSPHORUS: 4.3 mg/dL (ref 2.5–4.6)

## 2018-05-17 MED ORDER — CHLORHEXIDINE GLUCONATE CLOTH 2 % EX PADS: 6.0000 | MEDICATED_PAD | Freq: Every day | CUTANEOUS | Status: DC

## 2018-05-17 NOTE — Progress Notes (Signed)
Patient ID: Trevor [name], male   DOB: Mar 08, 1946, 72 y.o.   MRN: [id number]   Advanced Heart Failure VAD Team Note  Subjective:    No complaints today, no further lightheadedness.  MAP 80, afebrile, hgb 8.6.  No overt GI bleeding.   LVAD Interrogation HM 2: Speed: 9200 Flow: 5.6 PI: 4.8 Power: 4.8 - VAD interrogated personally. Parameters stable.    Objective:    Vital Signs:   Temp:  [98 F (36.7 C)-98.4 F (36.9 C)] 98 F (36.7 C) (07/14 0630) Pulse Rate:  [64-111] 111 (07/14 0836) Resp:  [16-20] 16 (07/14 0836) BP: (87-105)/(71-79) 96/79 (07/14 0630) SpO2:  [94 %-98 %] 94 % (07/14 0836) Weight:  [161 lb 6 oz (73.2 kg)] 161 lb 6 oz (73.2 kg) (07/14 0630) Last BM Date: 05/15/18 Mean arterial Pressure 80  Intake/Output:   Intake/Output Summary (Last 24 hours) at 05/17/2018 1126 Last data filed at 05/17/2018 0758 Gross per 24 hour  Intake 340 ml  Output 300 ml  Net 40 ml     Physical Exam    General: Well appearing this am. NAD.  HEENT: Normal. Neck: Supple, JVP 7-8 cm. Carotids OK.  Cardiac:  Mechanical heart sounds with LVAD hum present.  Lungs:  CTAB, normal effort.  Abdomen:  NT, ND, no HSM. No bruits or masses. +BS  LVAD exit site: Well-healed and incorporated. Dressing dry and intact. No erythema or drainage. Stabilization device present and accurately applied. Driveline dressing changed daily per sterile technique. Extremities:  Warm and dry. No cyanosis, clubbing, rash.  1+ ankle edema.  Neuro:  Alert & oriented x 3. Cranial nerves grossly intact. Moves all 4 extremities w/o difficulty. Affect pleasant     Telemetry   N/A  EKG    No new tracings.    Labs   Basic Metabolic Panel: Recent Labs  Lab 05/14/18 0655 05/15/18 0614 05/15/18 2054 05/16/18 0518 05/17/18 0647  NA 136 138 136 136 135  K 3.6 3.9 4.4 4.3 4.1  CL 95* 96* 96* 95* 95*  CO2 30 27 30 31 28   GLUCOSE 127* 108* 201* 130* 147*  BUN 11 23 6* 9 20  CREATININE 3.26* 4.84* 2.48*  3.33* 4.44*  CALCIUM 8.2* 8.4* 8.0* 8.1* 8.0*  PHOS  --   --   --   --  4.3    Liver Function Tests: Recent Labs  Lab 05/15/18 2054  AST 32  ALT 12  ALKPHOS 101  BILITOT 0.7  PROT 6.8  ALBUMIN 2.7*   No results for input(s): LIPASE, AMYLASE in the last 168 hours. No results for input(s): AMMONIA in the last 168 hours.  CBC: Recent Labs  Lab 05/13/18 0615 05/14/18 0655 05/15/18 0614 05/16/18 0518 05/17/18 0647  WBC 9.0 10.2 11.1* 9.9 10.2  HGB 9.1* 9.7* 9.6* 8.8* 8.6*  HCT 30.7* 31.8* 32.4* 29.7* 29.1*  MCV 96.8 96.7 96.7 96.4 96.7  PLT 196 201 233 192 197    INR: Recent Labs  Lab 05/13/18 0615 05/14/18 0655 05/15/18 0614 05/16/18 0518 05/17/18 0647  INR 2.29 2.09 1.95 2.43 2.55    Other results:  EKG:    Imaging   No results found.   Medications:     Scheduled Medications: . atorvastatin  40 mg Oral q1800  . budesonide  0.25 mg Nebulization BID  . busPIRone  5 mg Oral BID  . calcitRIOL  0.5 mcg Oral Q M,W,F  . Chlorhexidine Gluconate Cloth  6 each Topical Q0600  .  darbepoetin (ARANESP) injection - DIALYSIS  100 mcg Subcutaneous Q Mon-HD  . feeding supplement (NEPRO CARB STEADY)  237 mL Oral BID WC  . hydrocerin   Topical BID  . insulin aspart  0-15 Units Subcutaneous TID WC  . insulin aspart  0-5 Units Subcutaneous QHS  . insulin aspart  3 Units Subcutaneous TID WC  . insulin glargine  4 Units Subcutaneous Daily  . insulin glargine  6 Units Subcutaneous QHS  . levothyroxine  25 mcg Oral QAC breakfast  . multivitamin  1 tablet Oral QHS  . pantoprazole  40 mg Oral BID  . [START ON 05/31/2018] pantoprazole  40 mg Oral Daily  . senna-docusate  1 tablet Oral BID  . sildenafil  20 mg Oral TID  . traMADol  50 mg Oral BID  . traZODone  25 mg Oral QHS  . Warfarin - Pharmacist Dosing Inpatient   Does not apply q1800    Infusions:   PRN Medications: acetaminophen, albuterol, aluminum hydroxide, benzonatate, bisacodyl, diphenhydrAMINE,  guaiFENesin-dextromethorphan, ondansetron (ZOFRAN) IV, oxyCODONE, polyethylene glycol, traMADol   Patient Profile   Trevor Whitenight Murphyis a 72 y.o.malewith a history of CAD s/p CABG x 4 2010, OSA, AS with TAVR 2015, ,ESRD on HD,DM2,chronicatrial fibrillation, asthma,GI bleed,and ischemic cardiomyopathy with Medtronic ICD.S/PHMII LVAD for Destination therapy on 12/14/2015.  Transferred back from [place] on 6/27 after undergoing enterotomy and clipping of a colonic Dueilafoy lesion as well I&D of septic left knee.  Assessment/Plan:    1. GI Bleed/Symptomatic Anemia:GI AVMs. Double balloon retro endoscopy at [facility], unable to identify bleeding lesion. He failed attempted IR embolization x 3 while at [facility]. 12 units PRBCs at [facility]. Received 15 uPRBC while at [facility]. IR consulted, but did not find any active bleeding on CTA 5/20. Repeat capsule study showed active bleeding in the mid ileum.Surgery was reconsulted and recommended transfer back to [facility] for [facility] surgery.Now s/p laparoscopic surgical enteroscopy with 4 clips on 6/7 at [facility], c/b ischemic bowel/SBO. Now tolerating full diet if he eats in small quantities. No evidence currently for overt bleeding. Hgb 8.6 this morning.  - Tolerating warfarin. Goal INR 1.8-2.3, adjust to keep in this range.  No ASA.  - Getting Aranesp per nephrology.  - He will continue octreotide as outpatient.  2. 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 S/pHM II LVAD placed 2035for DT.Has Medtronic ICD.Volume controlled by HD. MWF/Sat schedule.  VAD interrogated personally. Parameters stable.  LDH stable 203. MAP stable.  -Continue sildenafil 20 mg tid. - VAD interrogated personally. Parameters stable.   4. ESRD- M/W/F/Sa: HD per Renal. Continue MWF/Sa schedule but will not have HD today.  5. Chronic Afib: Rate controlled.  - On coumadin.  6. HTN: MAP stable.   7.Septicleftknee: s/pseveral aspirations and OR wash out x2. Aspirate cultures  positive for staph epi and clostridium perfingens. Blood cultures negative at [facility]. Finished Ancef 7/2.  Ortho saw 6/28  Stitches removed 05/05/18 8. DM II -SSI. No change.  9.Hx ofCAD s/pCABG. - Continue statin 10. Hx ofTAVR:Valvestable on last echo2/2019. No change.  11. Deconditioning - Appreciate CIR care - No change to current plan.   12. Insomnia: Improved with limiting interruptions in night - Trazadone has for him worked in the past. Continue PRN.  - No change to current plan.    Possibly home Tuesday.   I reviewed the LVAD parameters from today, and compared the results to the patient's prior recorded data.  No programming changes were made.  The LVAD is  functioning within specified parameters.  The patient performs LVAD self-test daily.  LVAD interrogation was negative for any significant power changes, alarms or PI events/speed drops.  LVAD equipment check completed and is in good working order.  Back-up equipment present.   LVAD education done on emergency procedures and precautions and reviewed exit site care.   Length of Stay: 46  Loralie Champagne, MD 05/17/2018, 11:26 AM  VAD Team --- VAD ISSUES ONLY--- Pager [phone] (7am - 7am)  Advanced Heart Failure Team  Pager [phone] (M-F; 7a - 4p)  Please contact [facility] for night-coverage after hours (4p -7a ) and weekends on [email or web address]

## 2018-05-17 NOTE — Progress Notes (Addendum)
[facility] Progress Note   Subjective:   Sitting on bedside.  States he is feeling much better today.  No dizziness, cough improved.   Objective Vitals:   05/16/18 1631 05/16/18 1958 05/17/18 0630 05/17/18 0836  BP: (!) 88/74 105/75 96/79   Pulse: 64 79 76 (!) 111  Resp:  20 18 16   Temp:  98.4 F (36.9 C) 98 F (36.7 C)   TempSrc:  Oral Oral   SpO2: 95% 97% 98% 94%  Weight:   73.2 kg (161 lb 6 oz)   Height:       Physical Exam General:NAD, chronically ill appearing elderly male Heart:+LVAD hum, +mechanical heart sounds Lungs:+scattered wheezes throughout, nml WOB Extremities:2+ LE edema Dialysis Access: LU AVF   Filed Weights   05/16/18 0500 05/16/18 0643 05/17/18 0630  Weight: 71.2 kg (156 lb 15.5 oz) 72.1 kg (158 lb 15.2 oz) 73.2 kg (161 lb 6 oz)    Intake/Output Summary (Last 24 hours) at 05/17/2018 1016 Last data filed at 05/17/2018 0758 Gross per 24 hour  Intake 340 ml  Output 300 ml  Net 40 ml    Additional Objective Labs: Basic Metabolic Panel: Recent Labs  Lab 05/15/18 2054 05/16/18 0518 05/17/18 0647  NA 136 136 135  K 4.4 4.3 4.1  CL 96* 95* 95*  CO2 30 31 28   GLUCOSE 201* 130* 147*  BUN 6* 9 20  CREATININE 2.48* 3.33* 4.44*  CALCIUM 8.0* 8.1* 8.0*   Liver Function Tests: Recent Labs  Lab 05/15/18 2054  AST 32  ALT 12  ALKPHOS 101  BILITOT 0.7  PROT 6.8  ALBUMIN 2.7*   CBC: Recent Labs  Lab 05/13/18 0615 05/14/18 0655 05/15/18 0614 05/16/18 0518 05/17/18 0647  WBC 9.0 10.2 11.1* 9.9 10.2  HGB 9.1* 9.7* 9.6* 8.8* 8.6*  HCT 30.7* 31.8* 32.4* 29.7* 29.1*  MCV 96.8 96.7 96.7 96.4 96.7  PLT 196 201 233 192 197   CBG: Recent Labs  Lab 05/16/18 1039 05/16/18 1153 05/16/18 1623 05/16/18 2118 05/17/18 0627  GLUCAP 224* 224* 215* 250* 146*    Lab Results  Component Value Date   INR 2.55 05/17/2018   INR 2.43 05/16/2018   INR 1.95 05/15/2018   Studies/Results: No results found.  Medications:  .  atorvastatin  40 mg Oral q1800  . budesonide  0.25 mg Nebulization BID  . busPIRone  5 mg Oral BID  . calcitRIOL  0.5 mcg Oral Q M,W,F  . Chlorhexidine Gluconate Cloth  6 each Topical Q0600  . Chlorhexidine Gluconate Cloth  6 each Topical Q0600  . darbepoetin (ARANESP) injection - DIALYSIS  100 mcg Subcutaneous Q Mon-HD  . feeding supplement (NEPRO CARB STEADY)  237 mL Oral BID WC  . hydrocerin   Topical BID  . insulin aspart  0-15 Units Subcutaneous TID WC  . insulin aspart  0-5 Units Subcutaneous QHS  . insulin aspart  3 Units Subcutaneous TID WC  . insulin glargine  4 Units Subcutaneous Daily  . insulin glargine  6 Units Subcutaneous QHS  . levothyroxine  25 mcg Oral QAC breakfast  . multivitamin  1 tablet Oral QHS  . pantoprazole  40 mg Oral BID  . [START ON 05/31/2018] pantoprazole  40 mg Oral Daily  . senna-docusate  1 tablet Oral BID  . sildenafil  20 mg Oral TID  . traMADol  50 mg Oral BID  . traZODone  25 mg Oral QHS  . Warfarin - Pharmacist Dosing Inpatient  Does not apply q1800    Dialysis Orders: [facility] (has been inpatient since May 2019) 4.5hr 83 kg (now 70- 74kgw/o battery) 2/2 bath L AVF Hep none   Assessment/Plan: 1.Debility - CIR admission 2. GIB - Hx GI AVMs - s/p multiple procedures - lap enteroscopy @[name]  6/7 - s/p multiple blood transfusions. Hgb 9.6>8.8>8.6. Coumadin resumed. 3. Septic L knee - s/p IV ABX course I&Dx2 4. ICM s/p LVAD - per HF team 5. ESRD -HD MWFS; keep map>60 on HD for LVAD. No HD yesterday due to symptomatic hypotension.  Weight today 73.2kg - likely new EDW.  Was not feeling good at lower wts.  Resume regular schedule on Monday.  K 4.3.  To add BS check around 3rd hour of HD due to pt request - often drops post HD. 6. Volume - Does not appear grossly volume overloaded. LE edema continues to be an ongoing issue and attempts to completely resolve have left patient hypotensive.  Significant weight loss since first admitted in  May, will need new EDW at d/c, likely near today's weight of 73kg.   7. Anemia of CKD-Hgb 9.6>8.8>8.6, cont to follow. Cont Aranesp 118mcg q Monday. 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 Last Phos 2.6. Not on binders, cont VDRA. 9. HTN -BP improved but remains soft. Not on antihypertensives.  10. Nutrition - Alb 2.7. Diet liberalized. Renavite. Nepro. 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 Chronic A fib 13. DMT2    Jen Mow, PA-C [facility] Pager: [phone] 05/17/2018,10:16 AM  LOS: 12 days   Pt seen, examined, agree w assess/plan as above with additions as indicated.  Kelly Splinter MD [employer] pager [phone]    cell [phone] 05/17/2018, 12:43 PM

## 2018-05-17 NOTE — Progress Notes (Signed)
Physical Therapy Session Note  Patient Details  Name: CAILEN MIHALIK MRN: [id number] Date of Birth: 1945-11-13  Today's Date: 05/17/2018 PT Individual Time: 1000-1100 PT Individual Time Calculation (min): 60 min   Short Term Goals: Week 2:  PT Short Term Goal 1 (Week 2): = LTGs  Skilled Therapeutic Interventions/Progress Updates:    pt performs bed mobility and transfers with mod I with RW.  Gait 20' x 2 with RW with supervision.  Step ups to simulate 1 step for home entry performed multiple attempts with RW and min A.  Pt most comfortable stepping up with Lt LE despite pain.  Pt performs nustep x 8 minutes level 4 for UE/LE strength and cardiovascular endurance.  W/c mobility with supervision throughout unit.  Pt performs stand pivot transfer without AD with min A due to Lt knee pain. Pt educated on importance of use of RW at all times due to pain and to prevent falls, he expresses understanding.  Pt left in bed with needs at hand.   Therapy Documentation Precautions:  Precautions Precautions: Other (comment), Fall Precaution Comments: LVAD Required Braces or Orthoses: Other Brace/Splint Other Brace/Splint: hinged knee brace per pt comfort Restrictions Weight Bearing Restrictions: No Pain: Pt reports pain in Lt LE when wt bearing, eases with rest, meds given prior to session.    Therapy/Group: Individual Therapy  Desere Gwin 05/17/2018, 11:03 AM

## 2018-05-17 NOTE — Progress Notes (Signed)
ANTICOAGULATION CONSULT NOTE  Pharmacy Consult for warfarin Indication: LVAD   Patient Measurements: Height: 5\' 5"  (165.1 cm) Weight: 161 lb 6 oz (73.2 kg) IBW/kg (Calculated) : 61.5 Heparin Dosing Weight: 79.5 kg  Vital Signs: Temp: 98 F (36.7 C) (07/14 0630) Temp Source: Oral (07/14 0630) BP: 96/79 (07/14 0630) Pulse Rate: 76 (07/14 0630)  Labs: Recent Labs    05/15/18 0614 05/15/18 2054 05/16/18 0518 05/17/18 0647  HGB 9.6*  --  8.8* 8.6*  HCT 32.4*  --  29.7* 29.1*  PLT 233  --  192 197  LABPROT 22.1*  --  26.2* 27.2*  INR 1.95  --  2.43 2.55  CREATININE 4.84* 2.48* 3.33*  --     Medical History: Past Medical History:  Diagnosis Date  . AICD (automatic cardioverter/defibrillator) present   . Asthma   . AVM (arteriovenous malformation) of colon 07/07/2017  . CHF (congestive heart failure) ([place])   . Diabetes ([place])   . ESRF (end stage renal failure) ([place]) 07/07/2017   pt receiving hemodialysis Monday- Wednesday-friday, sometimes Saturday  . Kidney disease   . LVAD (left ventricular assist device) present ([place]) 12/2016  . Permanent atrial fibrillation ([place]) 07/07/2017  . Presence of permanent cardiac pacemaker    AICD  . Sleep apnea     Assessment: 33 yom who underwent HM2 LVAD implantation on 12/14/2015. Has undergoing extensive GI workup to evaluate bleeding. Was stopped on anticoagulants as of admission on 03/09/2018. Last outpatient regimen was (as of 02/17/2018): 7.5 mg daily except 10 mg on Tues/Thurs for goal 2-2.5. Goal INR was decreased to 1.8-2.3 after admission on 02/28/2018. Now s/p laparoscopic surgical enteroscopy with 4 clips on 04/10/2018.  Heparin and warfarin were resumed on 6/27 >> now off heparin infusion. Patient had only received two doses of warfarin prior to INR unexpectedly jumping (peaked at 4.71) previously - have been dosing lower than previous home regimen since.   Today, 05/17/2018: - INR increased from  2.43 to 2.55 today - no bleeding  documented - no signif. Drug-drug intxns noted  Goal of Therapy:  INR goal: 1.8-2.2 Monitor platelets by anticoagulation protocol: Yes   Plan:  - Hold Warfarin dose today d/t supra-therapeutic INR -Daily INR/CBC  -Monitor s/sx bleeding  Alanda Slim, PharmD, [facility] 05/17/2018 8:48 AM

## 2018-05-17 NOTE — Progress Notes (Signed)
Occupational Therapy Session Note  Patient Details  Name: FREDDERICK SWANGER MRN: [id number] Date of Birth: 1946-01-23  Today's Date: 05/17/2018 OT Individual Time: 0388-8280 OT Individual Time Calculation (min): 30 min  Short Term Goals: Week 1:  OT Short Term Goal 1 (Week 1): STG = LTGs due to ELOS OT Short Term Goal 1 - Progress (Week 1): Progressing toward goal  Skilled Therapeutic Interventions/Progress Updates:    Pt greeted in recliner. ADL needs met. Notified RN of pts c/o knee pain and he was medicated at start of session. Pt self propelled to dayroom and we worked on improving activity tolerance and general strength via ambulating with device and also navigating around w/c obstacle course. He required supervision assist for walking 60 ft with RW with cues for diaphragmatic breathing and pacing. Also discussed DME needs and concerns regarding d/c on Tuesday. Extra time required for meeting task demands of obstacle course. Afterwards pt was escorted back to room and ambulated short distance to recliner. He switched over from battery<wall power with setup and increased time. Pt left with all needs at session exit.   Therapy Documentation Precautions:  Precautions Precautions: Other (comment), Fall Precaution Comments: LVAD Required Braces or Orthoses: Other Brace/Splint Other Brace/Splint: hinged knee brace per pt comfort Restrictions Weight Bearing Restrictions: No Vital Signs: Therapy Vitals Temp: 98.1 F (36.7 C) Temp Source: Oral Pulse Rate: 79 Resp: 18 BP: 94/74 Patient Position (if appropriate): Sitting Oxygen Therapy SpO2: 99 % O2 Device: Room Air Pain: Pain Assessment Pain Score: 0-No pain ADL:      See Function Navigator for Current Functional Status.   Therapy/Group: Individual Therapy  Adiel Erney A [place] Asberry 05/17/2018, 3:47 PM

## 2018-05-17 NOTE — Progress Notes (Signed)
[place] PHYSICAL MEDICINE & REHABILITATION     PROGRESS NOTE  Subjective/Complaints:  Symptoms resolved after fluid feeling better this morning.  Denies any dizziness.  Yesterday.  ROS: Patient denies fever, rash, sore throat, blurred vision, nausea, vomiting, diarrhea, cough, shortness of breath or chest pain, joint or back pain, headache, or mood change.    Objective: Vital Signs: Blood pressure 96/79, pulse 76, temperature 98 F (36.7 C), temperature source Oral, resp. rate 18, height 5\' 5"  (1.651 m), weight 73.2 kg (161 lb 6 oz), SpO2 98 %. No results found. Recent Labs    05/15/18 0614 05/16/18 0518  WBC 11.1* 9.9  HGB 9.6* 8.8*  HCT 32.4* 29.7*  PLT 233 192   Recent Labs    05/15/18 2054 05/16/18 0518  NA 136 136  K 4.4 4.3  CL 96* 95*  GLUCOSE 201* 130*  BUN 6* 9  CREATININE 2.48* 3.33*  CALCIUM 8.0* 8.1*   CBG (last 3)  Recent Labs    05/16/18 1623 05/16/18 2118 05/17/18 0627  GLUCAP 215* 250* 146*    Wt Readings from Last 3 Encounters:  05/17/18 73.2 kg (161 lb 6 oz)  05/05/18 74.2 kg (163 lb 9.3 oz)  04/01/18 90.6 kg (199 lb 11.8 oz)    Physical Exam:  BP 96/79 (BP Location: Right Arm)   Pulse 76   Temp 98 F (36.7 C) (Oral)   Resp 18   Ht 5\' 5"  (1.651 m)   Wt 73.2 kg (161 lb 6 oz)   SpO2 98%   BMI 26.85 kg/m  Constitutional: No distress . Vital signs reviewed. HEENT: EOMI, oral membranes moist Neck: supple Cardiovascular: Home Respiratory: CTA Bilaterally without wheezes or rales. Normal effort    GI: BS +, non-tender, non-distended  Musculoskeletal: B/l LE pretibial edema, decreased left knee effusion, hamstrings remain tight/flexion contracture--stable Neurological: He is alert.  Motor: B/l UE: 4+/5 proximal to distal LLE: 2-3/5 proximal to distal. left knee remains limited by pain RLE: HF 4-/5,   ADF 4/5  Skin:  Left knee incision intact BLE with dry flaky skin.  Vascular changes b/l LE Psychiatric: He has a normal mood and  affect. His behavior is normal.   Assessment/Plan: 1. Functional deficits secondary to debility which require 3+ hours per day of interdisciplinary therapy in a comprehensive inpatient rehab setting. Physiatrist is providing close team supervision and 24 hour management of active medical problems listed below. Physiatrist and rehab team continue to assess barriers to discharge/monitor patient progress toward functional and medical goals.  Function:  Bathing Bathing position Bathing activity did not occur: Refused Position: Wheelchair/chair at sink  Bathing parts Body parts bathed by patient: Right arm, Left arm, Chest, Abdomen, Right upper leg, Left upper leg Body parts bathed by helper: Right lower leg, Left lower leg, Back  Bathing assist        Upper Body Dressing/Undressing Upper body dressing   What is the patient wearing?: Hospital gown                Upper body assist Assist Level: Set up   Set up : To obtain clothing/put away  Lower Body Dressing/Undressing Lower body dressing Lower body dressing/undressing activity did not occur: Refused What is the patient wearing?: Underwear, Non-skid slipper socks Underwear - Performed by patient: Thread/unthread right underwear leg, Thread/unthread left underwear leg Underwear - Performed by helper: Pull underwear up/down Pants- Performed by patient: Pull pants up/down Pants- Performed by helper: Thread/unthread right pants leg, Thread/unthread left  pants leg Non-skid slipper socks- Performed by patient: Don/doff right sock, Don/doff left sock Non-skid slipper socks- Performed by helper: Don/doff right sock, Don/doff left sock                  Lower body assist Assist for lower body dressing: (total/substantial assist)      Toileting Toileting Toileting activity did not occur: No continent bowel/bladder event Toileting steps completed by patient: Adjust clothing prior to toileting, Performs perineal hygiene, Adjust  clothing after toileting Toileting steps completed by helper: Performs perineal hygiene Toileting Assistive Devices: Grab bar or rail  Toileting assist Assist level: Touching or steadying assistance (Pt.75%)   Transfers Chair/bed transfer   Chair/bed transfer method: Stand pivot Chair/bed transfer assist level: Supervision or verbal cues Chair/bed transfer assistive device: Environmental consultant, Air cabin crew     Max distance: 30 Assist level: Supervision or verbal cues   Wheelchair   Type: Manual Max wheelchair distance: 228ft  Assist Level: Supervision or verbal cues  Cognition Comprehension Comprehension assist level: Follows complex conversation/direction with extra time/assistive device  Expression Expression assist level: Expresses complex ideas: With extra time/assistive device  Social Interaction Social Interaction assist level: Interacts appropriately with others with medication or extra time (anti-anxiety, antidepressant).  Problem Solving Problem solving assist level: Solves complex problems: With extra time  Memory Memory assist level: Recognizes or recalls 90% of the time/requires cueing < 10% of the time    Medical Problem List and Plan: 1.  Deficits with mobility, self-care secondary to LVAD as well as left knee infection/aspiration.  Continue CIR.   -knee brace with therapies 2.  DVT Prophylaxis/Anticoagulation: Pharmaceutical: Coumadin. Being followed closely by pharmacy.   INR therapeutic 7/13 3. Pain Management: continue oxycodone and/or ultram for pain. Monitor for signs of overdose as HD dependent.  4. Mood: team to provide ego support. LCSW to follow for evaluation and support.  5. Neuropsych: This patient appears capable of making decisions on his own behalf. 6. Skin/Wound Care: routine pressure relief measures.  7. Fluids/Electrolytes/Nutrition: Renal diet --decrease fluid restriction to 1200.Marland Kitchen  Monitor weights daily. Strict I/O.  8. GIB due to  AVMs with ABLA: On Protonix BID. Continue to monitor stools for melena/signs of bleeding. Augment bowel program as needed  Hemoglobin 8.8 on 7/13  Continue to monitor 9. CAD s/p CABG/ICM--s/p LVAD: Strict I/O with daily weights. LVAD self test daily. Monitor LVAD per protocol Filed Weights   05/16/18 0500 05/16/18 0643 05/17/18 0630  Weight: 71.2 kg (156 lb 15.5 oz) 72.1 kg (158 lb 15.2 oz) 73.2 kg (161 lb 6 oz)   -holding around 70kg,   -volume mgt per cards/renal,   -Encourage fluids 10 T2DM:  Pt on lantus 8u qhs at home  -   - Continue scheduled novolog 3 units with meals for now and moderate SSI.   -sugars showing improvement   -lantus 4u qam and 6 u qpm.    -Discussed with patient that we need to maintain his scheduled Lantus doses.  If he wants to refuse a dose for low sugars, then he can hold the NovoLog insulin.  Since he is refused insulin over the last 24 hours or so, his sugars have increased again 11. Septic Knee: Has completed antibiotic course on 05/04/18  -continue using knee brace when up  -scheduled ice for knee  -pain medication prior to activity 12. ESRD: HD MWFSa to help with fluid overload/anasarca.  -volume mgt per nephrology 13. Sleep disturbance: Scheduled  trazodone as at home.   14. Anxiety disorder: continue Buspar tid.  15. Leukocytosis  WBCs 9.9 7/13  Afebrile  Continue to monitor    LOS (Days) 12 A FACE TO FACE EVALUATION WAS PERFORMED  Meredith Staggers 05/17/2018 8:26 AM

## 2018-05-18 ENCOUNTER — Ambulatory Visit (HOSPITAL_COMMUNITY): Payer: Medicare Other | Admitting: Physical Therapy

## 2018-05-18 ENCOUNTER — Encounter (HOSPITAL_COMMUNITY): Payer: Medicare Other | Admitting: Occupational Therapy

## 2018-05-18 ENCOUNTER — Inpatient Hospital Stay (HOSPITAL_COMMUNITY): Payer: Medicare Other | Admitting: Physical Therapy

## 2018-05-18 LAB — CBC
HCT: 27.3 % — ABNORMAL LOW (ref 39.0–52.0)
Hemoglobin: 8.2 g/dL — ABNORMAL LOW (ref 13.0–17.0)
MCH: 29 pg (ref 26.0–34.0)
MCHC: 30 g/dL (ref 30.0–36.0)
MCV: 96.5 fL (ref 78.0–100.0)
Platelets: 178 10*3/uL (ref 150–400)
RBC: 2.83 MIL/uL — ABNORMAL LOW (ref 4.22–5.81)
RDW: 18.8 % — AB (ref 11.5–15.5)
WBC: 11.2 10*3/uL — ABNORMAL HIGH (ref 4.0–10.5)

## 2018-05-18 LAB — BASIC METABOLIC PANEL
Anion gap: 13 (ref 5–15)
BUN: 32 mg/dL — AB (ref 8–23)
CALCIUM: 7.9 mg/dL — AB (ref 8.9–10.3)
CO2: 24 mmol/L (ref 22–32)
Chloride: 98 mmol/L (ref 98–111)
Creatinine, Ser: 5.46 mg/dL — ABNORMAL HIGH (ref 0.61–1.24)
GFR calc Af Amer: 11 mL/min — ABNORMAL LOW (ref >60)
GFR, EST NON AFRICAN AMERICAN: 9 mL/min — AB (ref >60)
GLUCOSE: 126 mg/dL — AB (ref 70–99)
Potassium: 4 mmol/L (ref 3.5–5.1)
Sodium: 135 mmol/L (ref 135–145)

## 2018-05-18 LAB — LACTATE DEHYDROGENASE: LDH: 211 U/L — AB (ref 98–192)

## 2018-05-18 LAB — PROTIME-INR
INR: 2.31
PROTHROMBIN TIME: 25.2 s — AB (ref 11.4–15.2)

## 2018-05-18 LAB — GLUCOSE, CAPILLARY
GLUCOSE-CAPILLARY: 84 mg/dL (ref 70–99)
GLUCOSE-CAPILLARY: 208 mg/dL — AB (ref 70–99)
Glucose-Capillary: 110 mg/dL — ABNORMAL HIGH (ref 70–99)

## 2018-05-18 MED ORDER — CALCITRIOL 0.5 MCG PO CAPS
ORAL_CAPSULE | ORAL | Status: AC
  Filled 2018-05-18: qty 1

## 2018-05-18 MED ORDER — DARBEPOETIN ALFA 100 MCG/0.5ML IJ SOSY
PREFILLED_SYRINGE | INTRAMUSCULAR | Status: AC
  Filled 2018-05-18: qty 0.5

## 2018-05-18 MED ORDER — WARFARIN SODIUM 2.5 MG PO TABS
2.5000 mg | ORAL_TABLET | Freq: Once | ORAL | Status: AC
  Administered 2018-05-18: 2.5 mg via ORAL
  Filled 2018-05-18: qty 1

## 2018-05-18 NOTE — Progress Notes (Addendum)
[facility] PHYSICAL MEDICINE & REHABILITATION     PROGRESS NOTE  Subjective/Complaints:  Feeling well this morning. Denies dizziness despite low bp  ROS: Patient denies fever, rash, sore throat, blurred vision, nausea, vomiting, diarrhea, cough, shortness of breath or chest pain, joint or back pain, headache, or mood change.     Objective: Vital Signs: Blood pressure (!) 77/64, pulse (!) 58, temperature 97.9 F (36.6 C), temperature source Oral, resp. rate 16, height 5\' 5"  (1.651 m), weight 73.7 kg (162 lb 7.7 oz), SpO2 98 %. No results found. Recent Labs    05/17/18 0647 05/18/18 0803  WBC 10.2 11.2*  HGB 8.6* 8.2*  HCT 29.1* 27.3*  PLT 197 178   Recent Labs    05/17/18 0647 05/18/18 0803  NA 135 135  K 4.1 4.0  CL 95* 98  GLUCOSE 147* 126*  BUN 20 32*  CREATININE 4.44* 5.46*  CALCIUM 8.0* 7.9*   CBG (last 3)  Recent Labs    05/17/18 1640 05/17/18 2145 05/18/18 0630  GLUCAP 138* 237* 84    Wt Readings from Last 3 Encounters:  05/18/18 73.7 kg (162 lb 7.7 oz)  05/05/18 74.2 kg (163 lb 9.3 oz)  04/01/18 90.6 kg (199 lb 11.8 oz)    Physical Exam:  BP (!) 77/64 (BP Location: Right Arm)   Pulse (!) 58   Temp 97.9 F (36.6 C) (Oral)   Resp 16   Ht 5\' 5"  (1.651 m)   Wt 73.7 kg (162 lb 7.7 oz)   SpO2 98%   BMI 27.04 kg/m  Constitutional: No distress . Vital signs reviewed. HEENT: EOMI, oral membranes moist Neck: supple Cardiovascular: RRR without murmur. No JVD    Respiratory: CTA Bilaterally without wheezes or rales. Normal effort    GI: BS +, non-tender, non-distended  Musculoskeletal: B/l LE pretibial edema, decreased left knee effusion, hamstrings remain tight/flexion contracture--can extend to almost neutral.flexion limited somewhat Neurological: He is alert.  Motor: B/l UE: 5/5 proximal to distal LLE:  3/5 proximal to distal.   RLE: HF 4/5,   ADF 4+/5  Skin:  Left knee incision intact BLE with dry flaky skin.  Vascular changes b/l  LE Psychiatric: He has a normal mood and affect. His behavior is normal.   Assessment/Plan: 1. Functional deficits secondary to debility which require 3+ hours per day of interdisciplinary therapy in a comprehensive inpatient rehab setting. Physiatrist is providing close team supervision and 24 hour management of active medical problems listed below. Physiatrist and rehab team continue to assess barriers to discharge/monitor patient progress toward functional and medical goals.  Function:  Bathing Bathing position Bathing activity did not occur: Refused Position: (Sat in w/c at sink)  Bathing parts Body parts bathed by patient: (Entire body) Body parts bathed by helper: Right lower leg, Left lower leg, Back  Bathing assist Assist Level: No help, No cues      Upper Body Dressing/Undressing Upper body dressing   What is the patient wearing?: Hospital gown                Upper body assist Assist Level: No help, No cues   Set up : To obtain clothing/put away  Lower Body Dressing/Undressing Lower body dressing Lower body dressing/undressing activity did not occur: Refused What is the patient wearing?: [place], [place] Underwear - Performed by patient: (all ) Underwear - Performed by helper: Pull underwear up/down Pants- Performed by patient: Pull pants up/down Pants- Performed by helper: Thread/unthread right pants leg, Thread/unthread  left pants leg Non-skid slipper socks- Performed by patient: Don/doff right sock, Don/doff left sock Non-skid slipper socks- Performed by helper: Don/doff right sock, Don/doff left sock                  Lower body assist Assist for lower body dressing: Touching or steadying assistance (Pt > 75%)      Toileting Toileting Toileting activity did not occur: No continent bowel/bladder event Toileting steps completed by patient: Adjust clothing prior to toileting, Performs perineal hygiene, Adjust clothing after toileting Toileting  steps completed by helper: Performs perineal hygiene Toileting Assistive Devices: Grab bar or rail  Toileting assist Assist level: Touching or steadying assistance (Pt.75%)   Transfers Chair/bed transfer   Chair/bed transfer method: Stand pivot Chair/bed transfer assist level: No Help, no cues, assistive device, takes more than a reasonable amount of time Chair/bed transfer assistive device: Walker, Air cabin crew     Max distance: 50 Assist level: No help, No cues, assistive device, takes more than a reasonable amount of time   Wheelchair   Type: Manual Max wheelchair distance: 232ft  Assist Level: Supervision or verbal cues  Cognition Comprehension Comprehension assist level: Follows complex conversation/direction with extra time/assistive device  Expression Expression assist level: Expresses complex ideas: With extra time/assistive device  Social Interaction Social Interaction assist level: Interacts appropriately with others with medication or extra time (anti-anxiety, antidepressant).  Problem Solving Problem solving assist level: Solves complex problems: With extra time  Memory Memory assist level: Recognizes or recalls 90% of the time/requires cueing < 10% of the time    Medical Problem List and Plan: 1.  Deficits with mobility, self-care secondary to LVAD as well as left knee infection/aspiration.  -finalize dc planning for 7/16.   -knee brace with therapies 2.  DVT Prophylaxis/Anticoagulation: Pharmaceutical: Coumadin. Being followed closely by pharmacy.   INR therapeutic 7/15 3. Pain Management: continue oxycodone and/or ultram for pain. Monitor for signs of overdose as HD dependent.  4. Mood: team to provide ego support. LCSW to follow for evaluation and support.  5. Neuropsych: This patient appears capable of making decisions on his own behalf. 6. Skin/Wound Care: routine pressure relief measures.  7. Fluids/Electrolytes/Nutrition: Renal diet  --decrease fluid restriction to 1200.Marland Kitchen  Monitor weights daily. Strict I/O.  8. GIB due to AVMs with ABLA: On Protonix BID. Continue to monitor stools for melena/signs of bleeding. Augment bowel program as needed  Hemoglobin 8.2 on 7/15  Continue to monitor 9. CAD s/p CABG/ICM--s/p LVAD: Strict I/O with daily weights. LVAD self test daily. Monitor LVAD per protocol Filed Weights   05/16/18 0643 05/17/18 0630 05/18/18 0558  Weight: 72.1 kg (158 lb 15.2 oz) 73.2 kg (161 lb 6 oz) 73.7 kg (162 lb 7.7 oz)   -holding around 70kg,   -volume mgt per cards/renal,   -Encourage fluids 10 T2DM:  Pt on lantus 8u qhs at home  - Continue scheduled novolog 3 units with meals for now and moderate SSI.   -sugars showing improvement   -lantus 4u qam and 6 u qpm.    -will need further diabetes adjustment once home 11. Septic Knee: Has completed antibiotic course on 05/04/18  -continue using knee brace when up  -scheduled ice for knee  -pain medication prior to activity 12. ESRD: HD MWFSa to help with fluid overload/anasarca.  -volume mgt per nephrology 13. Sleep disturbance: Scheduled trazodone as at home.   14. Anxiety disorder: continue Buspar tid.  15. Leukocytosis  WBCs 9.9 7/13  Afebrile  Continue to monitor    LOS (Days) 13 A FACE TO FACE EVALUATION WAS PERFORMED  Meredith Staggers 05/18/2018 8:52 AM

## 2018-05-18 NOTE — Progress Notes (Signed)
[facility] Progress Note   Subjective:   Feeling well today.  Just finished morning PT, sitting in bedside chair.  LE edema slightly improved today.   Objective Vitals:   05/17/18 1243 05/17/18 1958 05/18/18 0558 05/18/18 0831  BP: 94/74 110/85 (!) 77/64   Pulse: 79 81 (!) 58   Resp: 18  16   Temp: 98.1 F (36.7 C) 98.2 F (36.8 C) 97.9 F (36.6 C)   TempSrc: Oral Oral Oral   SpO2: 99% 94% 97% 98%  Weight:   73.7 kg (162 lb 7.7 oz)   Height:       Physical Exam General:NAD, chronically ill appearing elderly male Heart:+LVAD hum, +mechanical heart sounds Lungs:+ scattered wheezing throughout. nml WOB Abdomen:soft, NTND, +BS Extremities:1+ LE edema b/l Dialysis Access: LU AVF +b/t  Filed Weights   05/16/18 0643 05/17/18 0630 05/18/18 0558  Weight: 72.1 kg (158 lb 15.2 oz) 73.2 kg (161 lb 6 oz) 73.7 kg (162 lb 7.7 oz)    Intake/Output Summary (Last 24 hours) at 05/18/2018 0958 Last data filed at 05/17/2018 1847 Gross per 24 hour  Intake 342 ml  Output -  Net 342 ml    Additional Objective Labs: Basic Metabolic Panel: Recent Labs  Lab 05/16/18 0518 05/17/18 0647 05/18/18 0803  NA 136 135 135  K 4.3 4.1 4.0  CL 95* 95* 98  CO2 31 28 24   GLUCOSE 130* 147* 126*  BUN 9 20 32*  CREATININE 3.33* 4.44* 5.46*  CALCIUM 8.1* 8.0* 7.9*  PHOS  --  4.3  --    Liver Function Tests: Recent Labs  Lab 05/15/18 2054  AST 32  ALT 12  ALKPHOS 101  BILITOT 0.7  PROT 6.8  ALBUMIN 2.7*   CBC: Recent Labs  Lab 05/14/18 0655 05/15/18 0614 05/16/18 0518 05/17/18 0647 05/18/18 0803  WBC 10.2 11.1* 9.9 10.2 11.2*  HGB 9.7* 9.6* 8.8* 8.6* 8.2*  HCT 31.8* 32.4* 29.7* 29.1* 27.3*  MCV 96.7 96.7 96.4 96.7 96.5  PLT 201 233 192 197 178    CBG: Recent Labs  Lab 05/17/18 0627 05/17/18 1132 05/17/18 1640 05/17/18 2145 05/18/18 0630  GLUCAP 146* 147* 138* 237* 84    Lab Results  Component Value Date   INR 2.31 05/18/2018   INR 2.55 05/17/2018    INR 2.43 05/16/2018   Studies/Results: No results found.  Medications:  . atorvastatin  40 mg Oral q1800  . budesonide  0.25 mg Nebulization BID  . busPIRone  5 mg Oral BID  . calcitRIOL  0.5 mcg Oral Q M,W,F  . Chlorhexidine Gluconate Cloth  6 each Topical Q0600  . darbepoetin (ARANESP) injection - DIALYSIS  100 mcg Subcutaneous Q Mon-HD  . feeding supplement (NEPRO CARB STEADY)  237 mL Oral BID WC  . hydrocerin   Topical BID  . insulin aspart  0-15 Units Subcutaneous TID WC  . insulin aspart  0-5 Units Subcutaneous QHS  . insulin aspart  3 Units Subcutaneous TID WC  . insulin glargine  4 Units Subcutaneous Daily  . insulin glargine  6 Units Subcutaneous QHS  . levothyroxine  25 mcg Oral QAC breakfast  . multivitamin  1 tablet Oral QHS  . pantoprazole  40 mg Oral BID  . [START ON 05/31/2018] pantoprazole  40 mg Oral Daily  . senna-docusate  1 tablet Oral BID  . sildenafil  20 mg Oral TID  . traMADol  50 mg Oral BID  . traZODone  25 mg Oral QHS  .  Warfarin - Pharmacist Dosing Inpatient   Does not apply q1800    Dialysis Orders: [facility] (has been inpatient since May 2019) 4.5hr 83 kg (now 70- 74kgw/o battery) 2/2 bath L AVF Hep none   Assessment/Plan: 1.Debility - CIR admission 2. GIB - Hx GI AVMs - s/p multiple procedures - lap enteroscopy @[name]  6/7 - s/p multiple blood transfusions. Hgb9.6>8.8>8.6.Coumadin resumed. 3. Septic L knee - s/p IV ABX course I&Dx2 4. ICM s/p LVAD - per HF team 5. ESRD -HD MWFS; keep map>60 on HD for LVAD.HD planned for today per regular schedule, net UF goal to standing weight 73.2kg. K4.0. To add BS check around 3rd hour of HD due to pt request - often drops post HD. 6. Volume - Does not appear grossly volume overloaded.LE edema continues to be an ongoing issue and attempts to completely resolve have left patient hypotensive.  Significant weight loss since first admitted in May, will need new EDW at d/c, plan to set OP EDW at  73.2kg.Feeleing poorly with lower than normal BP when below this weight.  7. Anemia of CKD-Hgb9.6>8.8>8.6>8.2, cont to follow. Cont Aranesp 174mcg q Monday. 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 Not on binders, cont VDRA. 9. HTN -BP low this AM, follow.  Denies symptoms.Not on antihypertensives. 10. Nutrition - lastAlb 2.7. Diet liberalized.Renavite. Nepro. 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 Chronic A fib 13. DMT2 14. Dispo - to be d/c 7/16 per primary.  WiIl convey new orders to OP dialysis center.    Jen Mow, PA-C [facility] Pager: [phone] 05/18/2018,9:58 AM  LOS: 13 days

## 2018-05-18 NOTE — Progress Notes (Signed)
patient refused meal coverage insulin of 3 units. CBG 110. Patient reports he will take "long acting insulin" tonight. RN aware.  Mliss Sax

## 2018-05-18 NOTE — Progress Notes (Signed)
LVAD Coordinator Rounding Note:  HM II LVAD implanted on 12/13/16 by Dr. Darcey Nora under Destination Therapy criteria due to age excluding heart transplantation.  Transferred from [facility] on 04/30/18 following laparoscopic surgical enteroscopy on 6/7 and received 4 clips. He received 13 uPRBCs but had no further bleeding post-op. Last octreotide injection 6/13. Danazol was DC'd. ASA and coumadin were not resumed. VAD parameters were stable.   Course complicated by AMS and concern for ischemic bowel. Improved with NGT decompression and bowel rest. Head CT negative. Blood cultures and UA were negative. Tolerated clears and thought stable for diet advancement.    Additionally, required multiple left knee aspirations and OR washouts. Aspirate cultures positive for staph epi and clostridium perfingen. Started on Vanc and narrowed to Ancef.   Pt currently in hallway rolling his wheelchair. He has already worked with PT and OT this morning. He is hoping to go home tomorrow.   Vital signs: Temp:  97.9 HR: 58 Automatic BP:  77/64(71) Doppler: 82 O2 Sat: 99% RA Wt:174>166>160>154>169>161 lbs  LVAD interrogation reveals:  Speed:  9200 Flow:  5.1 Power: 5.7 w PI: 4.9 Alarms: none Events:  Fixed speed: 9200 Low speed limit: 8600   Drive Line:  Right abd dressing CDI. Next dressing change 05/19/18.  Labs:  LDH trend: 297>277>282>260>251>241>212>138>235>218>211  INR trend: 4.51>4.71>3.08>2.32>1.99>2.0>1.97>2.18>1.90>2.09>2.31  Anticoagulation Plan: - INR Goal: 1.8-2.3 - ASA Dose: none due to hx of GI bleed - OP monthly Octreotide for hx of GI bleed  Blood Products:  None this admission  Device: - Medtronic single lead -Therapies: on 231 bpm  Arrythmias: chronic afib  Renal:  - chronic HD on M/W/F/Sat at [facility] on [facility]  Adverse Events on VAD: - 02/2017> GIB- AVM clipped x2 - 07/2017> renal failure- HD started - 11/2017>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 Margins not clear at neck. - 02/23/18> hospitalization for GI bleed. Colon/EGD 02/27/18 with normal EGD. Colonoscopy identified active bleeding in ileum; source not found. Capsule Endoscopy 02/27/18: Showed active bleeding in the ileum. Received 5 units blood with histamine reaction; will give benadryl as OP. - 03/10/18>transferrred to [facility] for double retro balloon scop and sent to IR emolization x 3 with no evidence of bleeding. Received 12 units PCs, remained off anticoagulants. Transferred back to [facility] 03/20/18 Seen by IR 5/20. CTA obtained, but no active bleeding found. Started on octreotide. 03/25/18: Repeat capsule endoscopy completed. Bleeding appears to come from the mid-ileum. 03/26/18: General surgery reconsulted. Recommended transfer back to [facility] for surgery with intraoperative endoscopy. [name] currently discussing options. Ortho consulted 5/24 for left knee pain. S/p knee aspiration and steroid injection.  Dose of premarin given 5/24.  On 5/25 developed abdominal bloating and distension with n/v. KUB suggestive of ileus versus early SBO. NGT placed for decompression.  Had large bloody BM after this and felt much better, diet now advanced to regular.  - 04/01/18 transferred to [facility] for  laparoscopic surgical enteroscopy for recurrent GI bleed   Plan/Recommendations: 1.  Weekly dressing changes per VAD coordinator. Next dressing change due 05/19/18. 2.  Call VAD pager if any questions re: VAD equipment or drive line site issues.   [name], VAD Coordinator 24/7 VAD pager: [phone]

## 2018-05-18 NOTE — Progress Notes (Signed)
Occupational Therapy Discharge Summary  Patient Details  Name: Trevor [name] MRN: [id number] Date of Birth: 1946-02-18  Patient has met 8 of 8 long term goals due to improved activity tolerance, postural control and ability to compensate for deficits.  Patient to discharge at overall Modified Independent level.  Patient's care partner is independent to provide the necessary intermittent to setup assistance at discharge.    Reasons goals not met: N/A  Recommendation:  Patient will benefit from ongoing skilled OT services in home health setting to continue to advance functional skills in the area of BADL and Reduce care partner burden.  Equipment: No equipment provided  Reasons for discharge: treatment goals met and discharge from hospital  Patient/family agrees with progress made and goals achieved: Yes  OT Discharge Precautions/Restrictions  Precautions Precautions: Fall Precaution Comments: LVAD Other Brace/Splint: hinged knee brace per pt comfort Restrictions Weight Bearing Restrictions: No Vital Signs Oxygen Therapy SpO2: 98 % O2 Device: Room Air Pain Pain Assessment Pain Scale: 0-10 Pain Score: 7  Faces Pain Scale: Hurts even more Pain Type: Acute pain Pain Location: Knee Pain Orientation: Left Pain Descriptors / Indicators: Aching Pain Onset: With Activity Patients Stated Pain Goal: 2 Pain Intervention(s): Medication (See eMAR);Repositioned ADL  See Function Navigator Vision Baseline Vision/History: Wears glasses Wears Glasses: At all times Patient Visual Report: No change from baseline Vision Assessment?: No apparent visual deficits Perception  Perception: Within Functional Limits Praxis Praxis: Intact Cognition Overall Cognitive Status: Within Functional Limits for tasks assessed Arousal/Alertness: Awake/alert Orientation Level: Oriented X4 Attention: Selective Selective Attention: Appears intact Memory: Appears intact Awareness: Appears  intact Problem Solving: Appears intact Safety/Judgment: Appears intact Sensation Sensation Light Touch: Appears Intact Proprioception: Appears Intact Coordination Gross Motor Movements are Fluid and Coordinated: Yes Fine Motor Movements are Fluid and Coordinated: Yes Motor  Motor Motor - Discharge Observations: improving strength Mobility  Bed Mobility Rolling Right: Independent Rolling Left: Independent Supine to Sit: Independent Sit to Supine: Independent Transfers Sit to Stand: Independent with assistive device Stand to Sit: Independent with assistive device  Trunk/Postural Assessment  Cervical Assessment Cervical Assessment: Within Functional Limits Thoracic Assessment Thoracic Assessment: Within Functional Limits Lumbar Assessment Lumbar Assessment: Within Functional Limits Postural Control Postural Control: Within Functional Limits  Balance Static Standing Balance Static Standing - Balance Support: During functional activity Static Standing - Level of Assistance: 6: Modified independent (Device/Increase time) Dynamic Standing Balance Dynamic Standing - Balance Support: During functional activity Dynamic Standing - Level of Assistance: 6: Modified independent (Device/Increase time) Extremity/Trunk Assessment RUE Assessment RUE Assessment: Within Functional Limits General Strength Comments: generalized weakness LUE Assessment LUE Assessment: Within Functional Limits General Strength Comments: generalized weakness   See Function Navigator for Current Functional Status.  Simonne Come 05/18/2018, 1:52 PM

## 2018-05-18 NOTE — Progress Notes (Signed)
Occupational Therapy Session Note  Patient Details  Name: Trevor [name] MRN: [id number] Date of Birth: July 26, 1946  Today's Date: 05/18/2018 OT Individual Time: 1000-1100 OT Individual Time Calculation (min): 60 min    Short Term Goals: Week 2:  OT Short Term Goal 1 (Week 2): Cont to work towards LTG of supervision-mod I overall  Skilled Therapeutic Interventions/Progress Updates:    Treatment session with focus on family education with sister and friend.  Pt reports bathing with nursing staff this AM at overall setup assist level.  Pt completed dressing at Independent level for UB dressing and setup LB dressing to utilize sock aid when donning Rt sock.  Pt's sister reports that she has a Secondary school teacher and sock aid at home that he can utilize.  Completed toilet transfer at Mod I level, even stepping out of bathroom door sidestepping as sister is unsure if RW will fit through doorway forwards.  Pt propelled w/c to ADL apt without assist.  Engaged in ambulation in ADL apt and completed tub/shower transfer with tub bench with supervision.  Discussed energy conservation strategies at home, encouraging pt to pace himself but to continue to get up as pain tolerates.  Pt's sister pleased with pt progress, just concerned about pt not "pushing" himself when he returns home.  Therapy Documentation Precautions:  Precautions Precautions: Fall Precaution Comments: LVAD Required Braces or Orthoses: Other Brace/Splint Other Brace/Splint: hinged knee brace per pt comfort Restrictions Weight Bearing Restrictions: No Vital Signs: Oxygen Therapy SpO2: 98 % O2 Device: Room Air Pain: Pain Assessment Pain Scale: 0-10 Pain Score: 5 Faces Pain Scale: Hurts even more Pain Type: Acute pain Pain Location: Knee Pain Orientation: Left Pain Descriptors / Indicators: Aching Pain Onset: With Activity Patients Stated Pain Goal: 2 Pain Intervention(s): Medication (See eMAR);Rest  See Function Navigator for  Current Functional Status.   Therapy/Group: Individual Therapy  Simonne Come 05/18/2018, 12:10 PM

## 2018-05-18 NOTE — Plan of Care (Signed)
  Problem: Consults Goal: RH GENERAL PATIENT EDUCATION Description See Patient Education module for education specifics. Outcome: Progressing Goal: Diabetes Guidelines if Diabetic/Glucose > 140 Description If diabetic or lab glucose is > 140 mg/dl - Initiate Diabetes/Hyperglycemia Guidelines & Document Interventions  Outcome: Progressing   Problem: RH BOWEL ELIMINATION Goal: RH STG MANAGE BOWEL WITH ASSISTANCE Description STG Manage Bowel with min ssistance.  Outcome: Progressing Goal: RH STG MANAGE BOWEL W/MEDICATION W/ASSISTANCE Description STG Manage Bowel with Medication with min Assistance.  Outcome: Progressing Goal: RH STG MANAGE BOWEL W/EQUIPMENT W/ASSISTANCE Description STG Manage Bowel With Equipment With min  Assistance  Outcome: Progressing Goal: RH OTHER STG BOWEL ELIMINATION GOALS W/ASSIST Description Other STG Bowel Elimination Goals With min Assistance.  Outcome: Progressing   Problem: RH SKIN INTEGRITY Goal: RH STG SKIN FREE OF INFECTION/BREAKDOWN Description Skin free from skin infection entire stay on rehab  Outcome: Progressing Goal: RH STG MAINTAIN SKIN INTEGRITY WITH ASSISTANCE Description STG Maintain Skin Integrity With mod I Assistance.  Outcome: Progressing Goal: RH STG ABLE TO PERFORM INCISION/WOUND CARE W/ASSISTANCE Description STG Able To Perform Incision/Wound Care With mod I Assistance.  Outcome: Progressing Goal: RH OTHER STG SKIN INTEGRITY GOALS W/ASSIST Description Other STG Skin Integrity Goals With mod I  Assistance.  Outcome: Progressing   Problem: RH SAFETY Goal: RH STG ADHERE TO SAFETY PRECAUTIONS W/ASSISTANCE/DEVICE Description STG Adhere to Safety Precautions With mod I Assistance/Device.  Outcome: Progressing Goal: RH STG DECREASED RISK OF FALL WITH ASSISTANCE Description STG Decreased Risk of Fall With mod I Assistance.  Outcome: Progressing   Problem: RH PAIN MANAGEMENT Goal: RH STG PAIN MANAGED AT OR BELOW PT'S PAIN  GOAL Description Less than 3 out of 10  Outcome: Progressing Goal: RH OTHER STG PAIN MANAGEMENT GOALS W/ASSIST Description Other STG Pain Management Goals With mod I Assistance.  Outcome: Progressing   Problem: RH KNOWLEDGE DEFICIT GENERAL Goal: RH STG INCREASE KNOWLEDGE OF SELF CARE AFTER HOSPITALIZATION Outcome: Progressing

## 2018-05-18 NOTE — Progress Notes (Addendum)
Patient ID: Trevor [name], male   DOB: 25-Dec-1945, 72 y.o.   MRN: [id number]   Advanced Heart Failure VAD Team Note  Subjective:    No complaints this am. Denies lightheadedness.   Labs pending.   LVAD Interrogation HM 2: Speed: 9200 Flow: 5.2 PI: 4.9 Power: 6.0. - VAD interrogated personally. Parameters stable.     Objective:    Vital Signs:   Temp:  [97.9 F (36.6 C)-98.2 F (36.8 C)] 97.9 F (36.6 C) (07/15 0558) Pulse Rate:  [58-111] 58 (07/15 0558) Resp:  [16-18] 16 (07/15 0558) BP: (77-110)/(64-85) 77/64 (07/15 0558) SpO2:  [94 %-99 %] 97 % (07/15 0558) Weight:  [162 lb 7.7 oz (73.7 kg)] 162 lb 7.7 oz (73.7 kg) (07/15 0558) Last BM Date: 05/17/18 Mean arterial Pressure 70-80s  Intake/Output:   Intake/Output Summary (Last 24 hours) at 05/18/2018 0827 Last data filed at 05/17/2018 1847 Gross per 24 hour  Intake 342 ml  Output -  Net 342 ml     Physical Exam    General: Well appearing this am. NAD.  HEENT: Normal. Neck: Supple, JVP 7-8 cm. Carotids OK.  Cardiac:  Mechanical heart sounds with LVAD hum present.  Lungs:  CTAB, normal effort.  Abdomen:  NT, ND, no HSM. No bruits or masses. +BS  LVAD exit site: Well-healed and incorporated. Dressing dry and intact. No erythema or drainage. Stabilization device present and accurately applied. Driveline dressing changed daily per sterile technique. Extremities:  Warm and dry. No cyanosis, clubbing, or rash. Trace to 1+ ankle edema.  Neuro:  Alert & oriented x 3. Cranial nerves grossly intact. Moves all 4 extremities w/o difficulty. Affect pleasant     Telemetry   N/A  EKG    No new tracings.    Labs   Basic Metabolic Panel: Recent Labs  Lab 05/14/18 0655 05/15/18 0614 05/15/18 2054 05/16/18 0518 05/17/18 0647  NA 136 138 136 136 135  K 3.6 3.9 4.4 4.3 4.1  CL 95* 96* 96* 95* 95*  CO2 30 27 30 31 28   GLUCOSE 127* 108* 201* 130* 147*  BUN 11 23 6* 9 20  CREATININE 3.26* 4.84* 2.48* 3.33* 4.44*    CALCIUM 8.2* 8.4* 8.0* 8.1* 8.0*  PHOS  --   --   --   --  4.3    Liver Function Tests: Recent Labs  Lab 05/15/18 2054  AST 32  ALT 12  ALKPHOS 101  BILITOT 0.7  PROT 6.8  ALBUMIN 2.7*   No results for input(s): LIPASE, AMYLASE in the last 168 hours. No results for input(s): AMMONIA in the last 168 hours.  CBC: Recent Labs  Lab 05/13/18 0615 05/14/18 0655 05/15/18 0614 05/16/18 0518 05/17/18 0647  WBC 9.0 10.2 11.1* 9.9 10.2  HGB 9.1* 9.7* 9.6* 8.8* 8.6*  HCT 30.7* 31.8* 32.4* 29.7* 29.1*  MCV 96.8 96.7 96.7 96.4 96.7  PLT 196 201 233 192 197    INR: Recent Labs  Lab 05/13/18 0615 05/14/18 0655 05/15/18 0614 05/16/18 0518 05/17/18 0647  INR 2.29 2.09 1.95 2.43 2.55    Other results:  EKG:    Imaging   No results found.   Medications:     Scheduled Medications: . atorvastatin  40 mg Oral q1800  . budesonide  0.25 mg Nebulization BID  . busPIRone  5 mg Oral BID  . calcitRIOL  0.5 mcg Oral Q M,W,F  . Chlorhexidine Gluconate Cloth  6 each Topical Q0600  . darbepoetin (ARANESP) injection -  DIALYSIS  100 mcg Subcutaneous Q Mon-HD  . feeding supplement (NEPRO CARB STEADY)  237 mL Oral BID WC  . hydrocerin   Topical BID  . insulin aspart  0-15 Units Subcutaneous TID WC  . insulin aspart  0-5 Units Subcutaneous QHS  . insulin aspart  3 Units Subcutaneous TID WC  . insulin glargine  4 Units Subcutaneous Daily  . insulin glargine  6 Units Subcutaneous QHS  . levothyroxine  25 mcg Oral QAC breakfast  . multivitamin  1 tablet Oral QHS  . pantoprazole  40 mg Oral BID  . [START ON 05/31/2018] pantoprazole  40 mg Oral Daily  . senna-docusate  1 tablet Oral BID  . sildenafil  20 mg Oral TID  . traMADol  50 mg Oral BID  . traZODone  25 mg Oral QHS  . Warfarin - Pharmacist Dosing Inpatient   Does not apply q1800    Infusions:   PRN Medications: acetaminophen, albuterol, aluminum hydroxide, benzonatate, bisacodyl, diphenhydrAMINE,  guaiFENesin-dextromethorphan, ondansetron (ZOFRAN) IV, oxyCODONE, polyethylene glycol, traMADol   Patient Profile   Trevor Kisner Murphyis a 72 y.o.malewith a history of CAD s/p CABG x 4 2010, OSA, AS with TAVR 2015, ,ESRD on HD,DM2,chronicatrial fibrillation, asthma,GI bleed,and ischemic cardiomyopathy with Medtronic ICD.S/PHMII LVAD for Destination therapy on 12/14/2015.  Transferred back from [place] on 6/27 after undergoing enterotomy and clipping of a colonic Trevor [name] lesion as well I&D of septic left knee.  Assessment/Plan:    1. GI Bleed/Symptomatic Anemia:GI AVMs. Double balloon retro endoscopy at [facility], unable to identify bleeding lesion. He failed attempted IR embolization x 3 while at [facility]. 12 units PRBCs at [facility]. Received 15 uPRBC while at [facility]. IR consulted, but did not find any active bleeding on CTA 5/20. Repeat capsule study showed active bleeding in the mid ileum.Surgery was reconsulted and recommended transfer back to [facility] for possible surgery.Now s/p laparoscopic surgical enteroscopy with 4 clips on 6/7 at [facility], c/b ischemic bowel/SBO. Now tolerating full diet if he eats in small quantities. No evidence currently for overt bleeding.  - Hgb pending this am. No overt bleeding.  - Tolerating warfarin. Goal INR 1.8-2.3, adjust to keep in this range.  No ASA.  - Getting Aranesp per nephrology.  - He will continue octreotide as outpatient.  2. 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 S/pHM II LVAD placed 2047for DT.Has Medtronic ICD.Volume controlled by HD. MWF/Sat schedule.  - VAD interrogated personally. Parameters stable.   - LDH pending.   -Continue sildenafil 20 mg tid. 4. ESRD- M/W/F/Sa: HD per Renal. Continue MWF/Sa schedule.  5. Chronic Afib: Rate controlled.  - On coumadin.  6. HTN:  - MAP stable.  7.Septicleftknee: s/pseveral aspirations and OR wash out x2. Aspirate cultures positive for staph epi and clostridium perfingens. Blood  cultures negative at [facility]. Finished Ancef 7/2.  Ortho saw 6/28  Stitches removed 05/05/18 - No change to current plan.   8. DM II -SSI. No change.  9.Hx ofCAD s/pCABG. - Continue statin. No change.  10. Hx ofTAVR:Valvestable on last echo2/2019. No change.  11. Deconditioning - Appreciate CIR care. Possibly home tomorrow.  - No change to current plan.   12. Insomnia: Improved with limiting interruptions in night - Trazadone has for him worked in the past. Continue PRN.  - No change to current plan.    Possibly home tomorrow.   I reviewed the LVAD parameters from today, and compared the results to the patient's prior recorded data.  No programming changes were made.  The LVAD is functioning within specified parameters.  The patient performs LVAD self-test daily.  LVAD interrogation was negative for any significant power changes, alarms or PI events/speed drops.  LVAD equipment check completed and is in good working order.  Back-up equipment present.   LVAD education done on emergency procedures and precautions and reviewed exit site care.   Length of Stay: [address]  Annamaria Helling 05/18/2018, 8:27 AM  VAD Team --- VAD ISSUES ONLY--- Pager [phone] (7am - 7am)  Advanced Heart Failure Team  Pager [phone] (M-F; 7a - 4p)  Please contact [facility] for night-coverage after hours (4p -7a ) and weekends on [email or web address]  Agree with the above note.  Hgb lower at 8.2 but no overt bleeding.  Getting HD today.  INR slightly above goal.  LVAD parameters stable. Will recheck hgb tomorrow.  Possibly home tomorrow if hgb stable.   Loralie Champagne 05/18/2018

## 2018-05-18 NOTE — Progress Notes (Signed)
ANTICOAGULATION CONSULT NOTE  Pharmacy Consult for warfarin Indication: LVAD   Patient Measurements: Height: 5\' 5"  (165.1 cm) Weight: 162 lb 7.7 oz (73.7 kg) IBW/kg (Calculated) : 61.5 Heparin Dosing Weight: 79.5 kg  Vital Signs: Temp: 97.9 F (36.6 C) (07/15 0558) Temp Source: Oral (07/15 0558) BP: 77/64 (07/15 0558) Pulse Rate: 58 (07/15 0558)  Labs: Recent Labs    05/16/18 0518 05/17/18 0647 05/18/18 0803  HGB 8.8* 8.6* 8.2*  HCT 29.7* 29.1* 27.3*  PLT 192 197 178  LABPROT 26.2* 27.2* 25.2*  INR 2.43 2.55 2.31  CREATININE 3.33* 4.44* 5.46*    Medical History: Past Medical History:  Diagnosis Date  . AICD (automatic cardioverter/defibrillator) present   . Asthma   . AVM (arteriovenous malformation) of colon 07/07/2017  . CHF (congestive heart failure) ([place])   . Diabetes ([place])   . ESRF (end stage renal failure) ([place]) 07/07/2017   pt receiving hemodialysis Monday- Wednesday-friday, sometimes Saturday  . Kidney disease   . LVAD (left ventricular assist device) present ([place]) 12/2016  . Permanent atrial fibrillation ([place]) 07/07/2017  . Presence of permanent cardiac pacemaker    AICD  . Sleep apnea     Assessment: 77 yom who underwent HM2 LVAD implantation on 12/14/2015. Has undergoing extensive GI workup to evaluate bleeding. Was stopped on anticoagulants as of admission on 03/09/2018. Last outpatient regimen was (as of 02/17/2018): 7.5 mg daily except 10 mg on Tues/Thurs for goal 2-2.5. Goal INR was decreased to 1.8-2.3 after admission on 02/28/2018. Now s/p laparoscopic surgical enteroscopy with 4 clips on 04/10/2018.  Heparin and warfarin were resumed on 6/27 >> now off heparin infusion. Patient had only received two doses of warfarin prior to INR unexpectedly jumping (peaked at 4.71) previously - have been dosing lower than previous home regimen since.  INR today is slightly above goal at 2.31 s/p holding warfarin over the weekend. Hgb is down slightly, LDH stable.  Goal  of Therapy:  INR goal: 1.8-2.2 Monitor platelets by anticoagulation protocol: Yes   Plan:  -Warfarin 2.5mg  PO x1 tonight -Daily INR  Arrie Senate, PharmD, BCPS Clinical Pharmacist [phone] Please check AMION for all [facility] numbers 05/18/2018

## 2018-05-18 NOTE — Progress Notes (Signed)
Physical Therapy Discharge Summary  Patient Details  Name: Trevor [name] MRN: [id number] Date of Birth: [birth year]  Today's Date: 05/18/2018 PT Individual Time: 0828-0922 and 1100-1135 PT Individual Time Calculation (min): 54 min and 35 min  Pt performs bed mobility and transfers throughout session with mod I with RW.  Household gait and bathroom mobility and transfers with mod I.  Standing balance with handwashing and grooming with mod I.  W/c mobility throughout unit with mod I >200'.  Simulated car transfer with RW and mod I.  Ramp negotiation with RW with supervision.  Pt left in recliner with needs at hand.  Session 2:  Pt continues with c/o Lt knee pain, RN made aware and meds given.  Session focused on pt/family education with pt's sister. Pt's sister educated on recommendations for RW use at all times, w/c for out of house and HHPT, she expresses understanding.  Pt performed curb step negotiation with RW and supervision as well as gait x 50' with supervisoin with sister able to provide appropriate supervision.  Pt's sister was educated on w/c parts management and verbalized understanding.  Pt left in bed for dialysis, missed final 25 minutes of session.  Patient has met 7 of 8 long term goals due to improved activity tolerance, improved balance, increased strength, decreased pain and ability to compensate for deficits.  Patient to discharge at an ambulatory level Modified Independent.   Patient's care partner is independent to provide the necessary intermittent supervision assistance at discharge.  Reasons goals not met: pt able to perform 1 step necessary for home entry  Recommendation:  Patient will benefit from ongoing skilled PT services in home health setting to continue to advance safe functional mobility, address ongoing impairments in strength, gait, balance, and minimize fall risk.  Equipment: w/c  Reasons for discharge: treatment goals met and discharge from  hospital  Patient/family agrees with progress made and goals achieved: Yes  PT Discharge Precautions/Restrictions Precautions Precautions: Fall Precaution Comments: LVAD Other Brace/Splint: hinged knee brace per pt comfort Restrictions Weight Bearing Restrictions: No Pain Pain Assessment Pain Scale: 0-10 Pain Score: Asleep Faces Pain Scale: Hurts even more Pain Type: Acute pain Pain Location: Knee Pain Orientation: Left Pain Descriptors / Indicators: Aching Pain Onset: With Activity Patients Stated Pain Goal: 2 Pain Intervention(s): Cold applied;Repositioned;Elevated extremity  Cognition Overall Cognitive Status: Within Functional Limits for tasks assessed Arousal/Alertness: Awake/alert Orientation Level: Oriented X4 Sensation Sensation Light Touch: Appears Intact Proprioception: Appears Intact Coordination Gross Motor Movements are Fluid and Coordinated: Yes Fine Motor Movements are Fluid and Coordinated: Yes Motor  Motor Motor - Discharge Observations: improving strength  Mobility Bed Mobility Rolling Right: Independent Rolling Left: Independent Supine to Sit: Independent Sit to Supine: Independent Transfers Sit to Stand: Independent with assistive device Stand to Sit: Independent with assistive device Stand Pivot Transfers: Independent with assistive device Locomotion  Gait Gait Assistance: Independent with assistive device Gait Distance (Feet): 50 Feet Assistive device: Rolling walker Stairs / Additional Locomotion Ramp: Engineer, maintenance (IT) Assistance: Independent with assistive device Distance: 200  Trunk/Postural Assessment  Cervical Assessment Cervical Assessment: Within Functional Limits Thoracic Assessment Thoracic Assessment: Within Functional Limits Lumbar Assessment Lumbar Assessment: Within Functional Limits Postural Control Postural Control: Within Functional Limits  Balance Static Standing  Balance Static Standing - Balance Support: During functional activity Static Standing - Level of Assistance: 6: Modified independent (Device/Increase time) Dynamic Standing Balance Dynamic Standing - Balance Support: During functional activity Dynamic Standing - Level of Assistance: 6: Modified  independent (Device/Increase time) Extremity Assessment      RLE Assessment RLE Assessment: Within Functional Limits LLE Assessment General Strength Comments: grossly 3/5, knee not formally assessed due to pain   See Function Navigator for Current Functional Status.  Seeley Southgate 05/18/2018, 9:24 AM

## 2018-05-19 ENCOUNTER — Encounter (HOSPITAL_COMMUNITY): Payer: Self-pay | Admitting: Unknown Physician Specialty

## 2018-05-19 LAB — GLUCOSE, CAPILLARY
GLUCOSE-CAPILLARY: 210 mg/dL — AB (ref 70–99)
GLUCOSE-CAPILLARY: 169 mg/dL — AB (ref 70–99)
Glucose-Capillary: 155 mg/dL — ABNORMAL HIGH (ref 70–99)

## 2018-05-19 LAB — CBC
HCT: 28.8 % — ABNORMAL LOW (ref 39.0–52.0)
HEMOGLOBIN: 8.6 g/dL — AB (ref 13.0–17.0)
MCH: 28.5 pg (ref 26.0–34.0)
MCHC: 29.9 g/dL — ABNORMAL LOW (ref 30.0–36.0)
MCV: 95.4 fL (ref 78.0–100.0)
PLATELETS: 189 10*3/uL (ref 150–400)
RBC: 3.02 MIL/uL — AB (ref 4.22–5.81)
RDW: 19 % — ABNORMAL HIGH (ref 11.5–15.5)
WBC: 8.6 10*3/uL (ref 4.0–10.5)

## 2018-05-19 LAB — PROTIME-INR
INR: 1.83
PROTHROMBIN TIME: 21 s — AB (ref 11.4–15.2)

## 2018-05-19 LAB — LACTATE DEHYDROGENASE: LDH: 187 U/L (ref 98–192)

## 2018-05-19 LAB — BASIC METABOLIC PANEL
ANION GAP: 10 (ref 5–15)
BUN: 12 mg/dL (ref 8–23)
CO2: 34 mmol/L — ABNORMAL HIGH (ref 22–32)
Calcium: 8 mg/dL — ABNORMAL LOW (ref 8.9–10.3)
Chloride: 96 mmol/L — ABNORMAL LOW (ref 98–111)
Creatinine, Ser: 3.05 mg/dL — ABNORMAL HIGH (ref 0.61–1.24)
GFR calc Af Amer: 22 mL/min — ABNORMAL LOW (ref >60)
GFR, EST NON AFRICAN AMERICAN: 19 mL/min — AB (ref >60)
Glucose, Bld: 161 mg/dL — ABNORMAL HIGH (ref 70–99)
POTASSIUM: 3.4 mmol/L — AB (ref 3.5–5.1)
Sodium: 140 mmol/L (ref 135–145)

## 2018-05-19 MED ORDER — PANTOPRAZOLE SODIUM 40 MG PO TBEC: 40.0000 mg | DELAYED_RELEASE_TABLET | Freq: Two times a day (BID) | ORAL | 5 refills | Status: DC

## 2018-05-19 MED ORDER — SILDENAFIL CITRATE 20 MG PO TABS: 20.0000 mg | ORAL_TABLET | Freq: Three times a day (TID) | ORAL | 6 refills | Status: DC

## 2018-05-19 MED ORDER — WARFARIN SODIUM 2.5 MG PO TABS: 2.5000 mg | ORAL_TABLET | Freq: Once | ORAL | Status: DC

## 2018-05-19 MED ORDER — TRAZODONE HCL 50 MG PO TABS: 25.0000 mg | ORAL_TABLET | Freq: Every day | ORAL | 6 refills | Status: DC

## 2018-05-19 MED ORDER — RENA-VITE PO TABS: 1.0000 | ORAL_TABLET | Freq: Every day | ORAL | 6 refills | Status: AC

## 2018-05-19 MED ORDER — HYDROCERIN EX CREA: 1.0000 "application " | TOPICAL_CREAM | Freq: Two times a day (BID) | CUTANEOUS | 0 refills | Status: AC

## 2018-05-19 MED ORDER — INSULIN GLARGINE 100 UNIT/ML ~~LOC~~ SOLN: SUBCUTANEOUS | 11 refills | Status: AC

## 2018-05-19 MED ORDER — WARFARIN SODIUM 2.5 MG PO TABS: ORAL_TABLET | ORAL | 6 refills | Status: DC

## 2018-05-19 MED ORDER — POLYETHYLENE GLYCOL 3350 17 G PO PACK: 17.0000 g | PACK | Freq: Every day | ORAL | 0 refills | Status: AC | PRN

## 2018-05-19 MED ORDER — TRAMADOL HCL 50 MG PO TABS: 50.0000 mg | ORAL_TABLET | Freq: Two times a day (BID) | ORAL | 0 refills | Status: AC | PRN

## 2018-05-19 MED ORDER — SILDENAFIL CITRATE 20 MG PO TABS: 20.0000 mg | ORAL_TABLET | Freq: Three times a day (TID) | ORAL | 6 refills | Status: AC

## 2018-05-19 NOTE — Progress Notes (Addendum)
Patient ID: Trevor [name], male   DOB: September 30, 1946, 72 y.o.   MRN: [id number]   Advanced Heart Failure VAD Team Note  Subjective:    Denies CP. A little SOB this morning and asking for nebulizer. Main complaint is left knee pain, which makes it harder for him to move around. He is supposed to get a wheelchair for home.   Hemoglobin 8.6. INR 1.83.   LVAD Interrogation HM 2: Speed: 9200 Flow: 5.5 PI: 5.8 Power: 5.0. - VAD interrogated personally. Parameters stable.     Objective:    Vital Signs:   Temp:  [97.8 F (36.6 C)-98.6 F (37 C)] 97.8 F (36.6 C) (07/16 0640) Pulse Rate:  [45-94] 78 (07/16 0640) Resp:  [16-18] 17 (07/16 0640) BP: (72-105)/(35-85) 96/85 (07/16 0640) SpO2:  [94 %-98 %] 96 % (07/16 0640) Weight:  [158 lb 4.6 oz (71.8 kg)-161 lb 6 oz (73.2 kg)] 158 lb 4.6 oz (71.8 kg) (07/15 1607) Last BM Date: 05/17/18 Mean arterial Pressure 80s  Intake/Output:   Intake/Output Summary (Last 24 hours) at 05/19/2018 0847 Last data filed at 05/18/2018 1607 Gross per 24 hour  Intake -  Output 1386 ml  Net -1386 ml     Physical Exam    General: Well appearing this am. NAD.  HEENT: Normal. Neck: Supple, JVP 7-8 cm. Carotids OK.  Cardiac:  Mechanical heart sounds with LVAD hum present.  Lungs:  normal effort. Expiratory wheezes.  Abdomen:  NT, ND, no HSM. No bruits or masses. +BS  LVAD exit site:  Dressing dry and intact. No erythema or drainage. Stabilization device present and accurately applied.  Extremities:  Warm and dry. No cyanosis, clubbing, rash, BLE trace ankle edema. Left knee pink, scab over incision.  Neuro:  Alert & oriented x 3. Cranial nerves grossly intact. Moves all 4 extremities w/o difficulty. Affect pleasant    Telemetry   N/A  EKG    No new tracings.    Labs   Basic Metabolic Panel: Recent Labs  Lab 05/15/18 2054 05/16/18 0518 05/17/18 0647 05/18/18 0803 05/19/18 0549  NA 136 136 135 135 140  K 4.4 4.3 4.1 4.0 3.4*  CL 96* 95* 95*  98 96*  CO2 30 31 28 24  34*  GLUCOSE 201* 130* 147* 126* 161*  BUN 6* 9 20 32* 12  CREATININE 2.48* 3.33* 4.44* 5.46* 3.05*  CALCIUM 8.0* 8.1* 8.0* 7.9* 8.0*  PHOS  --   --  4.3  --   --     Liver Function Tests: Recent Labs  Lab 05/15/18 2054  AST 32  ALT 12  ALKPHOS 101  BILITOT 0.7  PROT 6.8  ALBUMIN 2.7*   No results for input(s): LIPASE, AMYLASE in the last 168 hours. No results for input(s): AMMONIA in the last 168 hours.  CBC: Recent Labs  Lab 05/15/18 0614 05/16/18 0518 05/17/18 0647 05/18/18 0803 05/19/18 0549  WBC 11.1* 9.9 10.2 11.2* 8.6  HGB 9.6* 8.8* 8.6* 8.2* 8.6*  HCT 32.4* 29.7* 29.1* 27.3* 28.8*  MCV 96.7 96.4 96.7 96.5 95.4  PLT 233 192 197 178 189    INR: Recent Labs  Lab 05/15/18 0614 05/16/18 0518 05/17/18 0647 05/18/18 0803 05/19/18 0549  INR 1.95 2.43 2.55 2.31 1.83    Other results:  EKG:    Imaging   No results found.   Medications:     Scheduled Medications: . atorvastatin  40 mg Oral q1800  . budesonide  0.25 mg Nebulization BID  .  busPIRone  5 mg Oral BID  . calcitRIOL  0.5 mcg Oral Q M,W,F  . Chlorhexidine Gluconate Cloth  6 each Topical Q0600  . darbepoetin (ARANESP) injection - DIALYSIS  100 mcg Subcutaneous Q Mon-HD  . feeding supplement (NEPRO CARB STEADY)  237 mL Oral BID WC  . hydrocerin   Topical BID  . insulin aspart  0-15 Units Subcutaneous TID WC  . insulin aspart  0-5 Units Subcutaneous QHS  . insulin aspart  3 Units Subcutaneous TID WC  . insulin glargine  4 Units Subcutaneous Daily  . insulin glargine  6 Units Subcutaneous QHS  . levothyroxine  25 mcg Oral QAC breakfast  . multivitamin  1 tablet Oral QHS  . pantoprazole  40 mg Oral BID  . [START ON 05/31/2018] pantoprazole  40 mg Oral Daily  . senna-docusate  1 tablet Oral BID  . sildenafil  20 mg Oral TID  . traMADol  50 mg Oral BID  . traZODone  25 mg Oral QHS  . Warfarin - Pharmacist Dosing Inpatient   Does not apply q1800     Infusions:   PRN Medications: acetaminophen, albuterol, aluminum hydroxide, benzonatate, bisacodyl, diphenhydrAMINE, guaiFENesin-dextromethorphan, ondansetron (ZOFRAN) IV, oxyCODONE, polyethylene glycol, traMADol   Patient Profile   Trevor Crewe Murphyis a 72 y.o.malewith a history of CAD s/p CABG x 4 2010, OSA, AS with TAVR 2015, ,ESRD on HD,DM2,chronicatrial fibrillation, asthma,GI bleed,and ischemic cardiomyopathy with Medtronic ICD.S/PHMII LVAD for Destination therapy on 12/14/2015.  Transferred back from [place] on 6/27 after undergoing enterotomy and clipping of a colonic Dueilafoy lesion as well I&D of septic left knee.  Assessment/Plan:    1. GI Bleed/Symptomatic Anemia:GI AVMs. Double balloon retro endoscopy at [facility], unable to identify bleeding lesion. He failed attempted IR embolization x 3 while at [facility]. 12 units [facility]BCs at [facility]. Received 15 uPRBC while at [facility]. IR consulted, but did not find any active bleeding on CTA 5/20. Repeat capsule study showed active bleeding in the mid ileum.Surgery was reconsulted and recommended transfer back to [facility] for possible surgery.Now s/p laparoscopic surgical enteroscopy with 4 clips on 6/7 at [facility], c/b ischemic bowel/SBO. Now tolerating full diet if he eats in small quantities. No evidence currently for overt bleeding.  - Hgb 8.6. Denies bleeding.  - Tolerating warfarin. Goal INR 1.8-2.3, adjust to keep in this range. No ASA. INR 1.83 this am.  - Getting Aranesp per nephrology.  - He will continue octreotide as outpatient.  2. 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 S/pHM II LVAD placed 2064for DT.Has Medtronic ICD.Volume controlled by HD. MWF/Sat schedule.  - VAD interrogated personally. Parameters stable.   - LDH stable at 187   -Continue sildenafil 20 mg tid. 4. ESRD- M/W/F/Sa: HD per Renal. Continue MWF/Sa schedule. No change.   5. Chronic Afib: Rate controlled.  - On coumadin. INR 1.8 this am.  6. HTN:   - MAP stable 80s 7.Septicleftknee: s/pseveral aspirations and OR wash out x2. Aspirate cultures positive for staph epi and clostridium perfingens. Blood cultures negative at [facility]. Finished Ancef 7/2.  Ortho saw 6/28  Stitches removed 05/05/18 - No change.   8. DM II -SSI. No change.  9.Hx ofCAD s/pCABG. - Continue statin. No change.  10. Hx ofTAVR:Valvestable on last echo2/2019. No change.  11. Deconditioning - Appreciate CIR care. Possibly home today 12. Insomnia: Improved with limiting interruptions in night - Trazadone has for him worked in the past. Continue PRN.  - No change.    Plan for discharge  today. Will arrange VAD follow up. He will need INR check end of this week or early next week in VAD clinic.   I reviewed the LVAD parameters from today, and compared the results to the patient's prior recorded data.  No programming changes were made.  The LVAD is functioning within specified parameters.  The patient performs LVAD self-test daily.  LVAD interrogation was negative for any significant power changes, alarms or PI events/speed drops.  LVAD equipment check completed and is in good working order.  Back-up equipment present.   LVAD education done on emergency procedures and precautions and reviewed exit site care.   Length of Stay: [address], NP 05/19/2018, 8:47 AM  VAD Team --- VAD ISSUES ONLY--- Pager [phone] (7am - 7am)  Advanced Heart Failure Team  Pager [phone] (M-F; 7a - 4p)  Please contact [facility] for night-coverage after hours (4p -7a ) and weekends on [email or web address]  Agree with the above note.  He is doing well today.  Hgb 8.6 (stable) and INR within goal range. He is going to be discharged from CIR today.  He will need close followup in LVAD clinic (arranged).   Loralie Champagne 05/19/2018 1:16 PM

## 2018-05-19 NOTE — Progress Notes (Signed)
[facility] PHYSICAL MEDICINE & REHABILITATION     PROGRESS NOTE  Subjective/Complaints:  Had a good day yesterday. No new complaints. Denies dizziness or any breathing problems. Left knee still sore  ROS: Patient denies fever, rash, sore throat, blurred vision, nausea, vomiting, diarrhea, cough, shortness of breath or chest pain,  headache, or mood change.      Objective: Vital Signs: Blood pressure 96/85, pulse 78, temperature 97.8 F (36.6 C), temperature source Oral, resp. rate 17, height 5\' 5"  (1.651 m), weight 71.8 kg (158 lb 4.6 oz), SpO2 96 %. No results found. Recent Labs    05/18/18 0803 05/19/18 0549  WBC 11.2* 8.6  HGB 8.2* 8.6*  HCT 27.3* 28.8*  PLT 178 189   Recent Labs    05/18/18 0803 05/19/18 0549  NA 135 140  K 4.0 3.4*  CL 98 96*  GLUCOSE 126* 161*  BUN 32* 12  CREATININE 5.46* 3.05*  CALCIUM 7.9* 8.0*   CBG (last 3)  Recent Labs    05/18/18 1612 05/18/18 2152 05/19/18 0637  GLUCAP 110* 208* 155*    Wt Readings from Last 3 Encounters:  05/18/18 71.8 kg (158 lb 4.6 oz)  05/05/18 74.2 kg (163 lb 9.3 oz)  04/01/18 90.6 kg (199 lb 11.8 oz)    Physical Exam:  BP 96/85 (BP Location: Right Arm)   Pulse 78   Temp 97.8 F (36.6 C) (Oral)   Resp 17   Ht 5\' 5"  (1.651 m)   Wt 71.8 kg (158 lb 4.6 oz)   SpO2 96%   BMI 26.34 kg/m  Constitutional: No distress . Vital signs reviewed. HEENT: EOMI, oral membranes moist Neck: supple Cardiovascular: hum   Respiratory: CTA Bilaterally without wheezes or rales. Normal effort    GI: BS +, non-tender, non-distended  Musculoskeletal:left knee unchanged, minimal edema now Neurological: He is alert.  Motor: B/l UE: 5/5 proximal to distal LLE:  3/5 proximal to distal.   RLE: HF 4/5,   ADF 4+/5  Skin:  Left knee incision intact BLE with dry flaky skin.  Vascular changes b/l LE Psychiatric: He has a normal mood and affect. His behavior is normal.   Assessment/Plan: 1. Functional deficits secondary to  debility which require 3+ hours per day of interdisciplinary therapy in a comprehensive inpatient rehab setting. Physiatrist is providing close team supervision and 24 hour management of active medical problems listed below. Physiatrist and rehab team continue to assess barriers to discharge/monitor patient progress toward functional and medical goals.  Function:  Bathing Bathing position Bathing activity did not occur: Refused Position: Wheelchair/chair at sink  Bathing parts Body parts bathed by patient: Right arm, Left arm, Chest, Abdomen, Front perineal area, Buttocks, Right upper leg, Back, Left lower leg, Right lower leg, Left upper leg Body parts bathed by helper: Right lower leg, Left lower leg, Back  Bathing assist Assist Level: No help, No cues      Upper Body Dressing/Undressing Upper body dressing   What is the patient wearing?: Pull over shirt/dress     Pull over shirt/dress - Perfomed by patient: Thread/unthread right sleeve, Thread/unthread left sleeve, Put head through opening, Pull shirt over trunk          Upper body assist Assist Level: No help, No cues   Set up : To obtain clothing/put away  Lower Body Dressing/Undressing Lower body dressing Lower body dressing/undressing activity did not occur: Refused What is the patient wearing?: Underwear, Pants, Non-skid slipper socks Underwear - Performed by patient:  Thread/unthread right underwear leg, Thread/unthread left underwear leg, Pull underwear up/down Underwear - Performed by helper: Pull underwear up/down Pants- Performed by patient: Thread/unthread right pants leg, Thread/unthread left pants leg, Pull pants up/down Pants- Performed by helper: Thread/unthread right pants leg, Thread/unthread left pants leg Non-skid slipper socks- Performed by patient: Don/doff right sock, Don/doff left sock Non-skid slipper socks- Performed by helper: Don/doff right sock, Don/doff left sock                  Lower body  assist Assist for lower body dressing: Set up, Assistive device Assistive Device Comment: reacher and sock aid    Toileting Toileting Toileting activity did not occur: No continent bowel/bladder event Toileting steps completed by patient: Adjust clothing prior to toileting, Performs perineal hygiene, Adjust clothing after toileting Toileting steps completed by helper: Performs perineal hygiene Toileting Assistive Devices: Grab bar or rail  Toileting assist Assist level: More than reasonable time   Transfers Chair/bed transfer   Chair/bed transfer method: Stand pivot Chair/bed transfer assist level: No Help, no cues, assistive device, takes more than a reasonable amount of time Chair/bed transfer assistive device: Walker, Air cabin crew     Max distance: 50 Assist level: No help, No cues, assistive device, takes more than a reasonable amount of time   Wheelchair   Type: Manual Max wheelchair distance: 228ft  Assist Level: Supervision or verbal cues  Cognition Comprehension Comprehension assist level: Follows complex conversation/direction with extra time/assistive device  Expression Expression assist level: Expresses complex ideas: With extra time/assistive device  Social Interaction Social Interaction assist level: Interacts appropriately with others with medication or extra time (anti-anxiety, antidepressant).  Problem Solving Problem solving assist level: Solves complex problems: With extra time  Memory Memory assist level: Recognizes or recalls 90% of the time/requires cueing < 10% of the time    Medical Problem List and Plan: 1.  Deficits with mobility, self-care secondary to LVAD as well as left knee infection/aspiration.  -discharge home today  -pt has cardiac, renal, and ortho follow up after discharge  -I do NOT need to see him for follow up     2.  DVT Prophylaxis/Anticoagulation: Pharmaceutical: Coumadin. Being followed closely by pharmacy.    INR therapeutic  3. Pain Management: continue oxycodone and/or ultram for pain. Monitor for signs of overdose as HD dependent.  4. Mood: team to provide ego support. LCSW to follow for evaluation and support.  5. Neuropsych: This patient appears capable of making decisions on his own behalf. 6. Skin/Wound Care: routine pressure relief measures.  7. Fluids/Electrolytes/Nutrition: Renal diet --decrease fluid restriction to 1200.Marland Kitchen  Monitor weights daily. Strict I/O.  8. GIB due to AVMs with ABLA: On Protonix BID. Continue to monitor stools for melena/signs of bleeding. Augment bowel program as needed  Hemoglobin 8.6 on 7/15  Continue to monitor 9. CAD s/p CABG/ICM--s/p LVAD: Strict I/O with daily weights. LVAD self test daily. Monitor LVAD per protocol Filed Weights   05/18/18 0558 05/18/18 1200 05/18/18 1607  Weight: 73.7 kg (162 lb 7.7 oz) 73.2 kg (161 lb 6 oz) 71.8 kg (158 lb 4.6 oz)   -holding around 70kg,   -volume mgt per cards/renal,   -Encourage fluids 10 T2DM:  Pt on lantus 8u qhs at home  - Continue scheduled novolog 3 units with meals for now and moderate SSI.   -sugars more stabile, controlled   -lantus 4u qam and 6 u qpm.    -will need further diabetes  adjustment once home 11. Septic Knee: Has completed antibiotic course on 05/04/18  -continue using knee brace when OOB  -scheduled ice for knee  -pain medication prior to activity 12. ESRD: HD MWFSa to help with fluid overload/anasarca.  -volume mgt per nephrology 13. Sleep disturbance: Scheduled trazodone as at home.   14. Anxiety disorder: continue Buspar tid.      LOS (Days) [place]  Meredith Staggers 05/19/2018 8:53 AM

## 2018-05-19 NOTE — Plan of Care (Signed)
  Problem: RH SAFETY Goal: RH STG ADHERE TO SAFETY PRECAUTIONS W/ASSISTANCE/DEVICE Description STG Adhere to Safety Precautions With mod I Assistance/Device.  Outcome: Progressing  Call light at hand, bed alarm, Proper footwear.   Problem: RH KNOWLEDGE DEFICIT GENERAL Goal: RH STG INCREASE KNOWLEDGE OF SELF CARE AFTER HOSPITALIZATION Outcome: Progressing

## 2018-05-19 NOTE — Progress Notes (Addendum)
Pt A/O, no noted distress. IV removed. Pt has all belongings. Pt was educated on discharge instructions and prescriptions. Staff wheeled pt down to lobby safely. Pt refused insulin at lunch as well. He was educated again

## 2018-05-19 NOTE — Progress Notes (Signed)
Pt A/O, no noted distress. Denies pain. Pt refuses novolog, educated pt on CBG/insulin coverage. He continue to refuse, he did take the lantus. Staff will continue to assist and meet needs.

## 2018-05-19 NOTE — Progress Notes (Signed)
Social Work  Discharge Note  The overall goal for the admission was met for:   Discharge location: Yes - pt returning home with sister who can provide assistance as needed.  Length of Stay: Yes - 14 days  Discharge activity level: Yes - independent - supervision  Home/community participation: Yes  Services provided included: MD, RD, PT, OT, RN, Pharmacy and SW  Financial Services: Medicare and Private Insurance: [employer] [facility]  Follow-up services arranged: Home Health: RN, PT via [facility], DME: 16x16 lightweight w/c with ELRs, basic back and seat cushions via [facility] and Patient/Family has no preference for HH/DME agencies  Comments (or additional information):  Patient/Family verbalized understanding of follow-up arrangements: Yes  Individual responsible for coordination of the follow-up plan: pt  Confirmed correct DME delivered: Lev Cervone 05/19/2018    Yonas Bunda

## 2018-05-19 NOTE — Progress Notes (Signed)
ANTICOAGULATION CONSULT NOTE  Pharmacy Consult for warfarin Indication: LVAD   Patient Measurements: Height: 5\' 5"  (165.1 cm) Weight: 158 lb 4.6 oz (71.8 kg) IBW/kg (Calculated) : 61.5 Heparin Dosing Weight: 79.5 kg  Vital Signs: Temp: 97.8 F (36.6 C) (07/16 0640) Temp Source: Oral (07/16 0640) BP: 96/85 (07/16 0640) Pulse Rate: 78 (07/16 0640)  Labs: Recent Labs    05/17/18 0647 05/18/18 0803 05/19/18 0549  HGB 8.6* 8.2* 8.6*  HCT 29.1* 27.3* 28.8*  PLT 197 178 189  LABPROT 27.2* 25.2* 21.0*  INR 2.55 2.31 1.83  CREATININE 4.44* 5.46* 3.05*    Medical History: Past Medical History:  Diagnosis Date  . AICD (automatic cardioverter/defibrillator) present   . Asthma   . AVM (arteriovenous malformation) of colon 07/07/2017  . CHF (congestive heart failure) ([place])   . Diabetes ([place])   . ESRF (end stage renal failure) ([place]) 07/07/2017   pt receiving hemodialysis Monday- Wednesday-friday, sometimes Saturday  . Kidney disease   . LVAD (left ventricular assist device) present ([place]) 12/2016  . Permanent atrial fibrillation ([place]) 07/07/2017  . Presence of permanent cardiac pacemaker    AICD  . Sleep apnea     Assessment: 65 yom who underwent HM2 LVAD implantation on 12/14/2015. Has undergoing extensive GI workup to evaluate bleeding. Was stopped on anticoagulants as of admission on 03/09/2018. Last outpatient regimen was (as of 02/17/2018): 7.5 mg daily except 10 mg on Tues/Thurs for goal 2-2.5. Goal INR was decreased to 1.8-2.3 after admission on 02/28/2018. Now s/p laparoscopic surgical enteroscopy with 4 clips on 04/10/2018.  Heparin and warfarin were resumed on 6/27 >> now off heparin infusion. Patient had only received two doses of warfarin prior to INR unexpectedly jumping (peaked at 4.71) previously - have been dosing lower than previous home regimen since.  INR today is within goal at 1.83 s/p holding warfarin over the weekend and restarting yesterday. Hgb and LDH are stable.  Pt to be discharged today. Prior home regimen was 47.5mg /week but pt has required lower amounts this admission closer to ~3mg /d and INR goal has since been reduced to 1.8-2.2. Recommend discharging on 5mg  Mon/Wed/Fri and 2.5mg  Tues/Thurs/Sat/Sun with INR follow-up at the end of this week or early next week.  Goal of Therapy:  INR goal: 1.8-2.2 Monitor platelets by anticoagulation protocol: Yes   Plan:  -Warfarin 2.5mg  PO x1 tonight -Daily INR  Arrie Senate, PharmD, BCPS Clinical Pharmacist [phone] Please check AMION for all [facility] Pharmacy numbers 05/19/2018

## 2018-05-19 NOTE — Discharge Summary (Signed)
Physician Discharge Summary  Patient ID: TRIG MCBRYAR MRN: [id number] DOB/AGE: [birth year] 72 y.o.  Admit date: 05/05/2018 Discharge date: 05/19/2018  Discharge Diagnoses:  Principal Problem:   Physical debility Active Problems:   Chronic systolic CHF (congestive heart failure) (HCC)   Leukocytosis   Acute blood loss anemia   History of GI bleed   Arterial hypotension   Discharged Condition: stable.   Significant Diagnostic Studies: Dg Chest 2 View  Result Date: 05/14/2018 CLINICAL DATA:  Cough, LVAD present, end-stage renal disease EXAM: CHEST - 2 VIEW COMPARISON:  Chest x-ray of 03/09/2018 and 02/23/2018 FINDINGS: No active infiltrate or effusion is seen. LVAD device is again noted. AICD leads are present. The heart is mildly enlarged. Aortic valve replacement is noted. Median sternotomy sutures are present. IMPRESSION: 1. No active lung disease. 2. LVAD present.  Stable cardiomegaly. 3. AICD leads remain. Electronically Signed   By: Ivar Drape M.D.   On: 05/14/2018 12:52    Labs:  Basic Metabolic Panel: Recent Labs  Lab 05/15/18 0614 05/15/18 2054 05/16/18 0518 05/17/18 0647 05/18/18 0803 05/19/18 0549  NA 138 136 136 135 135 140  K 3.9 4.4 4.3 4.1 4.0 3.4*  CL 96* 96* 95* 95* 98 96*  CO2 27 30 31 28 24  34*  GLUCOSE 108* 201* 130* 147* 126* 161*  BUN 23 6* 9 20 32* 12  CREATININE 4.84* 2.48* 3.33* 4.44* 5.46* 3.05*  CALCIUM 8.4* 8.0* 8.1* 8.0* 7.9* 8.0*  PHOS  --   --   --  4.3  --   --     CBC: Recent Labs  Lab 05/17/18 0647 05/18/18 0803 05/19/18 0549  WBC 10.2 11.2* 8.6  HGB 8.6* 8.2* 8.6*  HCT 29.1* 27.3* 28.8*  MCV 96.7 96.5 95.4  PLT 197 178 189    CBG: Recent Labs  Lab 05/18/18 1612 05/18/18 2152 05/19/18 0637 05/19/18 0958 05/19/18 1202  GLUCAP 110* 208* 155* 210* 169*    Brief HPI:   Trevor [name] is a 72 year old male with history of CAD s/p CABG, OSA, AS with TVAR, ESRD- HD, T2DM, CAF, ICM s/p LVAD 2/219 with prolonged  hospitalization since 03/09/18 due to GIB with need to return to [facility] X 2 for management of bleed, 13 total units PRBC and septic knee requiring I and D by Dr. Doreatha Martin. He was transferred back to [facility] on 6/27 for management. Coumadin resumed and H/H has been stable. He continues to be limited due to debility and CIR recommended due to functional deficits.    Hospital Course: Trevor [name] was admitted to rehab 05/05/2018 for inpatient therapies to consist of PT and OT at least three hours five days a week. Past admission physiatrist, therapy team and rehab RN have worked together to provide customized collaborative inpatient rehab.  HD has been ongoing on MWF and fluid overload has been compensated. He continues on protonix BID and no signs of recurrent bleeding noted. CBC has been monitored with HD and H/H has been ranging from 8.5 to 9.5.   His po intake is slowly improving and diabetes has been monitored on ac/hs basis.  Lantus was titrated to 4 units in am and 6 units in pm for more consistent coverage. He did reports issues with SOB and dizziness on 7/11 and work up was negative. Anxiety levels are improving and team has been providing ego support during his stay. Knee incision is healing well with decrease in irritation and ultram has been used bid  for pain control as he has weaned himself off oxycodone.   Pharmacy has been following closely for management of coumadin and patient was discharged on 5 mg MWF alternating with 2.5 mg on TTSS. INR goal is 108- 2.2  Repeat coumadin per [facility] on Friday or early next week per cards.  Heart failure team has been following for daily interrogation of LVAD, heart failure medication management and monitoring of MAP/LDH.  He has shown steady improvement in activity but continues to be limited by left knee pain. He has progressed to supervision level and will continue to receive follow up [place] [place] [facility] [place] [facility] [place] after discharge.     Rehab course:  During patient's stay in rehab weekly team conferences were held to monitor patient's progress, set goals and discuss barriers to discharge. At admission, patient required min assist with basic self care tasks and mobility.  He  has had improvement in activity tolerance, balance, postural control as well as ability to compensate for deficits. He is able to complete ADL tasks at modified independent level. He requires supervision with transfers and to ambulate 25' with increased time and RW. Family education was completed regarding all aspects of care.   Disposition: Home  Diet: Renal/Diabetic--1200 cc FR/day  Special Instructions: 1. Routine drive line care. 2. Keep knee incision clean and dry.  3. Check blood sugars ac/hs.   Allergies as of 05/19/2018   No Known Allergies     Medication List    STOP taking these medications   heparin 1000 unit/mL Soln injection   mometasone-formoterol 200-5 MCG/ACT Aero Commonly known as:  DULERA   octreotide 500 mcg in sodium chloride 0.9 % 250 mL   ondansetron 4 MG/2ML Soln injection Commonly known as:  ZOFRAN   oxyCODONE 5 MG immediate release tablet Commonly known as:  Oxy IR/ROXICODONE     TAKE these medications   acetaminophen 325 MG tablet Commonly known as:  TYLENOL Take 2 tablets (650 mg total) by mouth every 4 (four) hours as needed for headache or mild pain.   albuterol 108 (90 Base) MCG/ACT inhaler Commonly known as:  PROVENTIL HFA;VENTOLIN HFA Inhale 2 puffs into the lungs every 4 (four) hours as needed for wheezing or shortness of breath.   atorvastatin 40 MG tablet Commonly known as:  LIPITOR TAKE 1 TABLET(40 MG) BY MOUTH DAILY What changed:    how much to take  how to take this  when to take this  additional instructions   benzonatate 100 MG capsule Commonly known as:  TESSALON Take 1 capsule (100 mg total) by mouth 3 (three) times daily as needed for cough.   busPIRone 5 MG tablet Commonly known as:   BUSPAR Take 1 tablet (5 mg total) by mouth 2 (two) times daily. Notes to patient:  WITH DIALYSIS   calcitRIOL 0.5 MCG capsule Commonly known as:  ROCALTROL Take 1 capsule (0.5 mcg total) by mouth every Monday, Wednesday, and Friday with hemodialysis.   Darbepoetin Alfa 200 MCG/0.4ML Sosy injection Commonly known as:  ARANESP Inject 0.4 mLs (200 mcg total) into the vein every Monday with hemodialysis. Notes to patient:  WITH DIALYSIS   fluticasone 110 MCG/ACT inhaler Commonly known as:  FLOVENT HFA Inhale 2 puffs into the lungs 2 (two) times daily.   hydrocerin Crea Apply 1 application topically 2 (two) times daily.   insulin glargine 100 UNIT/ML injection Commonly known as:  LANTUS Use 4 units in morning and 6 units at bedtime. What  changed:    how much to take  how to take this  when to take this  additional instructions   levothyroxine 25 MCG tablet Commonly known as:  SYNTHROID, LEVOTHROID Take 1 tablet (25 mcg total) by mouth daily before breakfast.   multivitamin Tabs tablet Take 1 tablet by mouth at bedtime.   pantoprazole 40 MG tablet Commonly known as:  PROTONIX Take 1 tablet (40 mg total) by mouth 2 (two) times daily.   polyethylene glycol packet Commonly known as:  MIRALAX / GLYCOLAX Take 17 g by mouth daily as needed for mild constipation.   senna-docusate 8.6-50 MG tablet Commonly known as:  Senokot-S Take 1 tablet by mouth 2 (two) times daily.   sildenafil 20 MG tablet Commonly known as:  REVATIO Take 1 tablet (20 mg total) by mouth 3 (three) times daily.   traMADol 50 MG tablet Commonly known as:  ULTRAM Take 1-2 tablets (50-100 mg total) by mouth every 12 (twelve) hours as needed for up to 7 days for severe pain. What changed:    how much to take  when to take this   traZODone 50 MG tablet Commonly known as:  DESYREL Take 0.5 tablets (25 mg total) by mouth at bedtime. What changed:    how much to take  when to take this  reasons  to take this   warfarin 2.5 MG tablet Commonly known as:  COUMADIN Take as directed. If you are unsure how to take this medication, talk to your nurse or doctor. Original instructions:  Take 5 mg (2 tabs) M/W/F and 2.5 mg (1 tab) Tue/Thurs/Sat/Sunday      Follow-up Information    Meredith Staggers, MD. Call.   Specialty:  Physical Medicine and Rehabilitation Why:  Call as needed Contact information: [address] [place] [address] [phone]        Shona Needles, MD. Call.   Specialty:  Orthopedic Surgery Why:  for follow up appointment Contact information: [address] [place] [address] [phone]        Mauricia Area, MD Follow up.   Specialty:  Nephrology Contact information: [place] [address] [phone]        Wylene Simmer, MD. Schedule an appointment as soon as possible for a visit.   Specialty:  Orthopedic Surgery Contact information: [address] [place] [address] [place] [address] [phone]        Larey Dresser, MD Follow up.   Specialty:  Cardiology Contact information: [address] [place] [address] [phone]           Signed: Bary Leriche 05/21/2018, 6:43 PM

## 2018-05-19 NOTE — Discharge Instructions (Addendum)
Inpatient Rehab Discharge Instructions  Trevor [name] Discharge date and time:  05/19/18  Activities/Precautions/ Functional Status: Activity: no lifting, driving, or strenuous exercise  till cleared by MD Diet: renal diet--limit fluid intake to 1200 cc Wound Care: routine drive line care. Keep incision left knee clean and dry--Contact MD if you develop any problems with your incision/wound--redness, swelling, increase in pain, drainage or if you develop fever or chills.   Functional status:  ___ No restrictions     ___ Walk up steps independently _X__ 24/7 supervision/assistance   ___ Walk up steps with assistance ___ Intermittent supervision/assistance  ___ Bathe/dress independently ___ Walk with walker     _X__ Bathe/dress with assistance ___ Walk Independently    ___ Shower independently ___ Walk with assistance    ___ Shower with assistance _X__ No alcohol     ___ Return to work/school ________    COMMUNITY REFERRALS UPON DISCHARGE:    Home Health:   PT      RN                      Agency:  [facility] Phone: [phone]   Medical Equipment/Items Ordered:  Wheelchair, cushion                                                      Agency/Supplier:  [facility] @ [phone]  Special Instructions: 1. Monitor blood sugars before meals and bedtime.    My questions have been answered and I understand these instructions. I will adhere to these goals and the provided educational materials after my discharge from the hospital.  Patient/Caregiver Signature _______________________________ Date __________  Clinician Signature _______________________________________ Date __________  Please bring this form and your medication list with you to all your follow-up doctor's appointments.   Information on my medicine - Coumadin   (Warfarin)  This medication education was reviewed with me or my healthcare representative as part of my discharge preparation.  The pharmacist  that spoke with me during my hospital stay was:  Einar Grad, [facility]  Why was Coumadin prescribed for you? Coumadin was prescribed for you because you have a blood clot or a medical condition that can cause an increased risk of forming blood clots. Blood clots can cause serious health problems by blocking the flow of blood to the heart, lung, or brain. Coumadin can prevent harmful blood clots from forming. As a reminder your indication for Coumadin is:   Blood Clot Prevention After Heart Pump Surgery  What test will check on my response to Coumadin? While on Coumadin (warfarin) you will need to have an INR test regularly to ensure that your dose is keeping you in the desired range. The INR (international normalized ratio) number is calculated from the result of the laboratory test called prothrombin time (PT).  If an INR APPOINTMENT HAS NOT ALREADY BEEN MADE FOR YOU please schedule an appointment to have this lab work done by your health care provider within 7 days. Your INR goal is usually a number between:  2 to 3 or your provider may give you a more narrow range like 2-2.5.  Ask your health care provider during an office visit what your goal INR is.  What  do you need to  know  About  COUMADIN? Take Coumadin (warfarin)  exactly as prescribed by your healthcare provider about the same time each day.  DO NOT stop taking without talking to the doctor who prescribed the medication.  Stopping without other blood clot prevention medication to take the place of Coumadin may increase your risk of developing a new clot or stroke.  Get refills before you run out.  What do you do if you miss a dose? If you miss a dose, take it as soon as you remember on the same day then continue your regularly scheduled regimen the next day.  Do not take two doses of Coumadin at the same time.  Important Safety Information A possible side effect of Coumadin (Warfarin) is an increased risk of bleeding. You should call  your healthcare provider right away if you experience any of the following: ? Bleeding from an injury or your nose that does not stop. ? Unusual colored urine (red or dark brown) or unusual colored stools (red or black). ? Unusual bruising for unknown reasons. ? A serious fall or if you hit your head (even if there is no bleeding).  Some foods or medicines interact with Coumadin (warfarin) and might alter your response to warfarin. To help avoid this: ? Eat a balanced diet, maintaining a consistent amount of Vitamin K. ? Notify your provider about major diet changes you plan to make. ? Avoid alcohol or limit your intake to 1 drink for women and 2 drinks for men per day. (1 drink is 5 oz. wine, 12 oz. beer, or 1.5 oz. liquor.)  Make sure that ANY health care provider who prescribes medication for you knows that you are taking Coumadin (warfarin).  Also make sure the healthcare provider who is monitoring your Coumadin knows when you have started a new medication including herbals and non-prescription products.  Coumadin (Warfarin)  Major Drug Interactions  Increased Warfarin Effect Decreased Warfarin Effect  Alcohol (large quantities) Antibiotics (esp. Septra/Bactrim, Flagyl, Cipro) Amiodarone (Cordarone) Aspirin (ASA) Cimetidine (Tagamet) Megestrol (Megace) NSAIDs (ibuprofen, naproxen, etc.) Piroxicam (Feldene) Propafenone (Rythmol SR) Propranolol (Inderal) Isoniazid (INH) Posaconazole (Noxafil) Barbiturates (Phenobarbital) Carbamazepine (Tegretol) Chlordiazepoxide (Librium) Cholestyramine (Questran) Griseofulvin Oral Contraceptives Rifampin Sucralfate (Carafate) Vitamin K   Coumadin (Warfarin) Major Herbal Interactions  Increased Warfarin Effect Decreased Warfarin Effect  Garlic Ginseng Ginkgo biloba Coenzyme Q10 Green tea St. Johns wort    Coumadin (Warfarin) FOOD Interactions  Eat a consistent number of servings per week of foods HIGH in Vitamin K (1 serving =   cup)  Collards (cooked, or boiled & drained) Kale (cooked, or boiled & drained) Mustard greens (cooked, or boiled & drained) Parsley *serving size only =  cup Spinach (cooked, or boiled & drained) Swiss chard (cooked, or boiled & drained) Turnip greens (cooked, or boiled & drained)  Eat a consistent number of servings per week of foods MEDIUM-HIGH in Vitamin K (1 serving = 1 cup)  Asparagus (cooked, or boiled & drained) Broccoli (cooked, boiled & drained, or raw & chopped) Brussel sprouts (cooked, or boiled & drained) *serving size only =  cup Lettuce, raw (green leaf, endive, romaine) Spinach, raw Turnip greens, raw & chopped   These websites have more information on Coumadin (warfarin):  FailFactory.se; VeganReport.com.au;

## 2018-05-19 NOTE — Progress Notes (Unsigned)
LVAD Coordinator Rounding Note:  HM II LVAD implanted on 12/13/16 by Dr. Darcey Nora under Destination Therapy criteria due to age excluding heart transplantation.  Transferred from [facility] on 04/30/18 following laparoscopic surgical enteroscopy on 6/7 and received 4 clips. He received 13 uPRBCs but had no further bleeding post-op. Last octreotide injection 6/13. Danazol was DC'd. ASA and coumadin were not resumed. VAD parameters were stable.   Course complicated by AMS and concern for ischemic bowel. Improved with NGT decompression and bowel rest. Head CT negative. Blood cultures and UA were negative. Tolerated clears and thought stable for diet advancement.    Additionally, required multiple left knee aspirations and OR washouts. Aspirate cultures positive for staph epi and clostridium perfingen. Started on Vanc and narrowed to Ancef.   Pt sitting on the side of the bed, states he is going home today.   Vital signs: Temp:  97.9 HR: 58 Automatic BP:  77/64(71) Doppler: 82 O2 Sat: 99% RA Wt:174>166>160>154>169>161 lbs  LVAD interrogation reveals:  Speed:  9200 Flow:  5.1 Power: 5.7 w PI: 4.9 Alarms: none Events:  Fixed speed: 9200 Low speed limit: 8600   Drive Line:  Existing VAD dressing removed and site care performed using sterile technique. Drive line exit site cleaned with saline only, allowed to dry, and Sorbaview dressing without bio patch re-applied. Exit site healed and incorporated, the velour is fully implanted at exit site. No redness, tenderness, drainage, foul odor or rash noted. Drive line anchor re-applied. Pt denies fever or chills.   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 Margins not clear at neck. - 02/23/18> hospitalization for GI bleed. Colon/EGD 02/27/18 with normal EGD. Colonoscopy identified active bleeding in ileum; source not found. Capsule Endoscopy 02/27/18: Showed active bleeding in the ileum. Received 5 units blood with histamine reaction; will give benadryl as OP. - 03/10/18>transferrred to [facility] for double retro balloon scop and sent to IR emolization x 3 with no evidence of bleeding. Received 12 units PCs, remained off anticoagulants. Transferred back to [facility] 03/20/18 Seen by IR 5/20. CTA obtained, but no active bleeding found. Started on octreotide. 03/25/18: Repeat capsule endoscopy completed. Bleeding appears to come from the mid-ileum. 03/26/18: General surgery reconsulted. Recommended transfer back to [facility] for surgery with intraoperative endoscopy. [name] currently discussing options. Ortho consulted 5/24 for left knee pain. S/p knee aspiration and steroid injection.  Dose of premarin given 5/24.  On 5/25 developed abdominal bloating and distension with n/v. KUB suggestive of ileus versus early SBO. NGT placed for decompression.  Had large bloody BM after this and felt much better, diet now advanced to regular.  - 04/01/18 transferred to [facility] for  laparoscopic surgical enteroscopy for recurrent GI bleed   Plan/Recommendations: 1.  Weekly dressing changes per VAD coordinator. 2. Pt may d/c home today. Follow up Thursday 7/25 at 1000 3.  Call VAD pager if any questions re: VAD equipment or drive line site issues.   [name],  VAD Coordinator 24/7 VAD pager: [phone]

## 2018-05-25 ENCOUNTER — Encounter (HOSPITAL_COMMUNITY): Payer: Self-pay | Admitting: Pharmacy Technician

## 2018-05-25 ENCOUNTER — Emergency Department (HOSPITAL_COMMUNITY): Payer: Medicare Other

## 2018-05-25 ENCOUNTER — Emergency Department (HOSPITAL_COMMUNITY)
Admission: EM | Admit: 2018-05-25 | Discharge: 2018-05-25 | Disposition: A | Discharge status: Home / Self Care | Payer: Medicare Other | Attending: Emergency Medicine | Admitting: Emergency Medicine

## 2018-05-25 ENCOUNTER — Other Ambulatory Visit: Payer: Self-pay

## 2018-05-25 DIAGNOSIS — Z992 Dependence on renal dialysis: Secondary | ICD-10-CM | POA: Diagnosis not present

## 2018-05-25 DIAGNOSIS — Z7901 Long term (current) use of anticoagulants: Secondary | ICD-10-CM | POA: Insufficient documentation

## 2018-05-25 DIAGNOSIS — Z95811 Presence of heart assist device: Secondary | ICD-10-CM | POA: Insufficient documentation

## 2018-05-25 DIAGNOSIS — N186 End stage renal disease: Secondary | ICD-10-CM | POA: Diagnosis not present

## 2018-05-25 DIAGNOSIS — Z79899 Other long term (current) drug therapy: Secondary | ICD-10-CM | POA: Insufficient documentation

## 2018-05-25 DIAGNOSIS — Z85828 Personal history of other malignant neoplasm of skin: Secondary | ICD-10-CM | POA: Insufficient documentation

## 2018-05-25 DIAGNOSIS — D649 Anemia, unspecified: Secondary | ICD-10-CM | POA: Diagnosis not present

## 2018-05-25 DIAGNOSIS — E1122 Type 2 diabetes mellitus with diabetic chronic kidney disease: Secondary | ICD-10-CM | POA: Diagnosis not present

## 2018-05-25 DIAGNOSIS — I5022 Chronic systolic (congestive) heart failure: Secondary | ICD-10-CM | POA: Insufficient documentation

## 2018-05-25 DIAGNOSIS — Z952 Presence of prosthetic heart valve: Secondary | ICD-10-CM | POA: Diagnosis not present

## 2018-05-25 DIAGNOSIS — J45909 Unspecified asthma, uncomplicated: Secondary | ICD-10-CM | POA: Diagnosis not present

## 2018-05-25 DIAGNOSIS — E876 Hypokalemia: Secondary | ICD-10-CM | POA: Diagnosis not present

## 2018-05-25 DIAGNOSIS — Z794 Long term (current) use of insulin: Secondary | ICD-10-CM | POA: Insufficient documentation

## 2018-05-25 DIAGNOSIS — F411 Generalized anxiety disorder: Secondary | ICD-10-CM | POA: Diagnosis not present

## 2018-05-25 DIAGNOSIS — I2510 Atherosclerotic heart disease of native coronary artery without angina pectoris: Secondary | ICD-10-CM | POA: Insufficient documentation

## 2018-05-25 DIAGNOSIS — R0602 Shortness of breath: Secondary | ICD-10-CM | POA: Diagnosis present

## 2018-05-25 LAB — BASIC METABOLIC PANEL
ANION GAP: 13 (ref 5–15)
BUN: 6 mg/dL — AB (ref 8–23)
CHLORIDE: 94 mmol/L — AB (ref 98–111)
CO2: 29 mmol/L (ref 22–32)
Calcium: 8.1 mg/dL — ABNORMAL LOW (ref 8.9–10.3)
Creatinine, Ser: 2.09 mg/dL — ABNORMAL HIGH (ref 0.61–1.24)
GFR, EST AFRICAN AMERICAN: 35 mL/min — AB (ref >60)
GFR, EST NON AFRICAN AMERICAN: 30 mL/min — AB (ref >60)
Glucose, Bld: 216 mg/dL — ABNORMAL HIGH (ref 70–99)
POTASSIUM: 2.6 mmol/L — AB (ref 3.5–5.1)
SODIUM: 136 mmol/L (ref 135–145)

## 2018-05-25 LAB — PROTIME-INR
INR: 1.46
Prothrombin Time: 17.6 seconds — ABNORMAL HIGH (ref 11.4–15.2)

## 2018-05-25 LAB — CBC WITH DIFFERENTIAL/PLATELET
Abs Immature Granulocytes: 0.2 10*3/uL — ABNORMAL HIGH (ref 0.0–0.1)
BASOS ABS: 0.1 10*3/uL (ref 0.0–0.1)
Basophils Relative: 0 %
EOS PCT: 1 %
Eosinophils Absolute: 0.1 10*3/uL (ref 0.0–0.7)
HCT: 28.3 % — ABNORMAL LOW (ref 39.0–52.0)
HEMOGLOBIN: 8.4 g/dL — AB (ref 13.0–17.0)
IMMATURE GRANULOCYTES: 1 %
LYMPHS PCT: 6 %
Lymphs Abs: 0.6 10*3/uL — ABNORMAL LOW (ref 0.7–4.0)
MCH: 28.6 pg (ref 26.0–34.0)
MCHC: 29.7 g/dL — ABNORMAL LOW (ref 30.0–36.0)
MCV: 96.3 fL (ref 78.0–100.0)
Monocytes Absolute: 0.9 10*3/uL (ref 0.1–1.0)
Monocytes Relative: 8 %
NEUTROS ABS: 9.5 10*3/uL — AB (ref 1.7–7.7)
NEUTROS PCT: 84 %
PLATELETS: 190 10*3/uL (ref 150–400)
RBC: 2.94 MIL/uL — AB (ref 4.22–5.81)
RDW: 18.5 % — ABNORMAL HIGH (ref 11.5–15.5)
WBC: 11.3 10*3/uL — AB (ref 4.0–10.5)

## 2018-05-25 LAB — I-STAT TROPONIN, ED: TROPONIN I, POC: 0.05 ng/mL (ref 0.00–0.08)

## 2018-05-25 LAB — BRAIN NATRIURETIC PEPTIDE: B NATRIURETIC PEPTIDE 5: 1084.4 pg/mL — AB (ref 0.0–100.0)

## 2018-05-25 LAB — LACTATE DEHYDROGENASE: LDH: 222 U/L — ABNORMAL HIGH (ref 98–192)

## 2018-05-25 MED ORDER — IPRATROPIUM-ALBUTEROL 0.5-2.5 (3) MG/3ML IN SOLN
3.0000 mL | Freq: Once | RESPIRATORY_TRACT | Status: AC
  Administered 2018-05-25: 3 mL via RESPIRATORY_TRACT
  Filled 2018-05-25: qty 3

## 2018-05-25 MED ORDER — POTASSIUM CHLORIDE CRYS ER 20 MEQ PO TBCR
40.0000 meq | EXTENDED_RELEASE_TABLET | Freq: Once | ORAL | Status: AC
  Administered 2018-05-25: 40 meq via ORAL
  Filled 2018-05-25: qty 2

## 2018-05-25 NOTE — ED Provider Notes (Signed)
[facility] EMERGENCY DEPARTMENT Provider Note   CSN: [id number] Arrival date & time: 05/25/18  1845     History   Chief Complaint Chief Complaint  Patient presents with  . Shortness of Breath    HPI Trevor [name] is a 72 y.o. male.  Pt presents to the ED with sob.  The pt has a hx of CAD s/p CABG, AS with TAVR 2015, ESRD on HD (MWF), DM, chronic a.fib, GI bleed, ischemic cardiomyopathy with ICD.  LVAD placed in 2017.  The pt was admitted to the hospital from 6/27-7/2 for GI bleed which required multiple units of blood.  The pt did go to dialysis today and feels like they took too much fluid off.         Past Medical History:  Diagnosis Date  . AICD (automatic cardioverter/defibrillator) present   . Asthma   . AVM (arteriovenous malformation) of colon 07/07/2017  . CHF (congestive heart failure) ([place])   . Diabetes ([place])   . ESRF (end stage renal failure) ([place]) 07/07/2017   pt receiving hemodialysis Monday- Wednesday-friday, sometimes Saturday  . Kidney disease   . LVAD (left ventricular assist device) present ([place]) 12/2016  . Permanent atrial fibrillation ([place]) 07/07/2017  . Presence of permanent cardiac pacemaker    AICD  . Sleep apnea     Patient Active Problem List   Diagnosis Date Noted  . Subtherapeutic international normalized ratio (INR)   . Arterial hypotension   . Leukocytosis   . Acute blood loss anemia   . History of GI bleed   . Physical debility 05/05/2018  . Debility   . Diabetes mellitus type 2 in nonobese (HCC)   . ESRD on dialysis ([place])   . Pyogenic arthritis of left knee joint ([place])   . Sleep disturbance   . Generalized anxiety disorder   . Ileus ([place])   . Arteriovenous malformation   . GI bleed 03/09/2018  . Small bowel bleed not requiring more than 4 units of blood in 24 hours, ICU, or surgery   . Heme positive stool   . Cellulitis 12/03/2017  . 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 Chronic systolic CHF  (congestive heart failure) ([place]) 09/08/2017  . ESRD (end stage renal disease) ([place]) 07/07/2017  . AVM (arteriovenous malformation) of colon 07/07/2017  . Thrombocytopenia ([place]) 07/07/2017  . Permanent atrial fibrillation ([place]) 07/07/2017  . Anemia of chronic disease 07/07/2017  . CHF (congestive heart failure), NYHA class IV ([place]) 06/13/2017  . AVM (arteriovenous malformation) of colon with hemorrhage   . Symptomatic anemia 02/10/2017  . Type 2 diabetes mellitus ([place]) 01/17/2017  . COPD (chronic obstructive pulmonary disease) ([place]) 01/17/2017  . LVAD (left ventricular assist device) present ([place]) 01/09/2017  . Presence of left ventricular assist device (LVAD) ([place])   . Palliative care encounter   . Cardiorenal syndrome with renal failure 11/28/2016  . Cardiomyopathy, ischemic 11/27/2016  . Coronary artery disease 11/27/2016  . Status post aortic valve replacement 11/27/2016  . Asthma, chronic 11/27/2016  . OSA (obstructive sleep apnea) 11/27/2016    Past Surgical History:  Procedure Laterality Date  . AORTIC VALVE REPLACEMENT  2015   [place]  . APPLICATION OF A-CELL OF EXTREMITY Left 11/20/2017   Procedure: APPLICATION OF A-CELL;  Surgeon: Wallace Going, DO;  Location: [facility];  Service: Plastics;  Laterality: Left;  . AV FISTULA PLACEMENT Left 06/30/2017   Procedure: CREATION of LEFT ARM Brachiocephalic Fistula;  Surgeon: Conrad Lakeway, MD;  Location: [facility];  Service: Vascular;  Laterality: Left;  . CARDIAC CATHETERIZATION N/A 12/02/2016   Procedure: Right Heart Cath;  Surgeon: Larey Dresser, MD;  Location: [place] [facility] CV [facility];  Service: Cardiovascular;  Laterality: N/A;  . COLONOSCOPY N/A 02/14/2017   Procedure: COLONOSCOPY;  Surgeon: Milus Banister, MD;  Location: [facility];  Service: Endoscopy;  Laterality: N/A;  . COLONOSCOPY WITH PROPOFOL N/A 02/27/2018   Procedure: COLONOSCOPY WITH PROPOFOL;  Surgeon: Jerene Bears, MD;  Location: [facility];  Service:  Gastroenterology;  Laterality: N/A;  . CORONARY ANGIOPLASTY WITH STENT PLACEMENT  2013   in [place]  . CORONARY ARTERY BYPASS GRAFT  2010   in [place]  . DIALYSIS/PERMA CATHETER INSERTION  06/30/2017   Procedure: INSERTION Dialysis Catheter;  Surgeon: Conrad Richland, MD;  Location: [facility];  Service: Vascular;;  . ESOPHAGOGASTRODUODENOSCOPY (EGD) WITH PROPOFOL N/A 02/27/2018   Procedure: ESOPHAGOGASTRODUODENOSCOPY (EGD) WITH PROPOFOL;  Surgeon: Jerene Bears, MD;  Location: [facility] ENDOSCOPY;  Service: Gastroenterology;  Laterality: N/A;  . GIVENS CAPSULE STUDY N/A 02/27/2018   Procedure: GIVENS CAPSULE STUDY;  Surgeon: Jerene Bears, MD;  Location: [facility];  Service: Gastroenterology;  Laterality: N/A;  . GIVENS CAPSULE STUDY N/A 03/25/2018   Procedure: GIVENS CAPSULE STUDY;  Surgeon: Jackquline Denmark, MD;  Location: [facility] ENDOSCOPY;  Service: Endoscopy;  Laterality: N/A;  . IABP INSERTION N/A 12/11/2016   Procedure: IABP Insertion;  Surgeon: Larey Dresser, MD;  Location: [facility] CV LAB;  Service: Cardiovascular;  Laterality: N/A;  . INSERTION OF IMPLANTABLE LEFT VENTRICULAR ASSIST DEVICE N/A 12/13/2016   Procedure: INSERTION OF IMPLANTABLE LEFT VENTRICULAR ASSIST DEVICE;  Surgeon: Ivin Poot, MD;  Location: [facility];  Service: Open Heart Surgery;  Laterality: N/A;  HEARTMATE II  NITRIC OXIDE  . IR FLUORO GUIDE CV LINE LEFT  06/17/2017  . IR US GUIDE VASC ACCESS LEFT  06/17/2017  . MASS EXCISION Left 11/20/2017   Procedure: EXCISION OF LEFT NECK AND RIGHT LEG SKIN CANCER WITH A CELL PLACEMENT;  Surgeon: Wallace Going, DO;  Location: [facility];  Service: Plastics;  Laterality: Left;  Marland Kitchen MASS EXCISION Left 11/26/2017   Procedure: EXPLORATION AND EXCISION OF LEFT NECK;  Surgeon: Wallace Going, DO;  Location: [facility];  Service: Plastics;  Laterality: Left;  . RIGHT HEART CATH N/A 12/11/2016   Procedure: Right Heart Cath;  Surgeon: Larey Dresser, MD;  Location: [facility] CV LAB;  Service:  Cardiovascular;  Laterality: N/A;  . RIGHT HEART CATH N/A 03/27/2017   Procedure: Right Heart Cath;  Surgeon: Larey Dresser, MD;  Location: [facility] CV LAB;  Service: Cardiovascular;  Laterality: N/A;  . RIGHT HEART CATH N/A 06/13/2017   Procedure: RIGHT HEART CATH;  Surgeon: Jolaine Artist, MD;  Location: [facility] CV LAB;  Service: Cardiovascular;  Laterality: N/A;  . TEE WITHOUT CARDIOVERSION N/A 12/13/2016   Procedure: TRANSESOPHAGEAL ECHOCARDIOGRAM (TEE);  Surgeon: Ivin Poot, MD;  Location: [facility];  Service: Open Heart Surgery;  Laterality: N/A;  . TRICUSPID VALVE REPLACEMENT N/A 12/13/2016   Procedure: TRICUSPID VALVE REPAIR WITH EDWARDS MC 3 TRICUSPID ANNULOPLASTY RING MODEL 4900 SIZE T 28;  Surgeon: Ivin Poot, MD;  Location: [facility];  Service: Open Heart Surgery;  Laterality: N/A;        Home Medications    Prior to Admission medications   Medication Sig Start Date End Date Taking? Authorizing Provider  acetaminophen (TYLENOL)  325 MG tablet Take 2 tablets (650 mg total) by mouth every 4 (four) hours as needed for headache or mild pain. 03/10/18  Yes Shirley Friar, PA-C  albuterol (PROVENTIL HFA;VENTOLIN HFA) 108 (90 Base) MCG/ACT inhaler Inhale 2 puffs into the lungs every 4 (four) hours as needed for wheezing or shortness of breath. 02/10/18  Yes Larey Dresser, MD  atorvastatin (LIPITOR) 40 MG tablet TAKE 1 TABLET(40 MG) BY MOUTH DAILY Patient taking differently: Take 40 mg by mouth daily at 6 PM.  09/02/17  Yes Larey Dresser, MD  benzonatate (TESSALON) 100 MG capsule Take 1 capsule (100 mg total) by mouth 3 (three) times daily as needed for cough. 05/05/18  Yes Shirley Friar, PA-C  busPIRone (BUSPAR) 5 MG tablet Take 1 tablet (5 mg total) by mouth 2 (two) times daily. 01/22/18  Yes Bensimhon, Shaune Pascal, MD  calcitRIOL (ROCALTROL) 0.5 MCG capsule Take 1 capsule (0.5 mcg total) by mouth every Monday, Wednesday, and Friday with hemodialysis. 03/11/18   Yes Shirley Friar, PA-C  Darbepoetin Alfa (ARANESP) 200 MCG/0.4ML SOSY injection Inject 0.4 mLs (200 mcg total) into the vein every Monday with hemodialysis. 04/06/18  Yes Clegg, Amy D, NP  fluticasone (FLOVENT HFA) 110 MCG/ACT inhaler Inhale 2 puffs into the lungs 2 (two) times daily. 02/10/18  Yes Larey Dresser, MD  hydrocerin (EUCERIN) CREA Apply 1 application topically 2 (two) times daily. 05/19/18  Yes Shirley Friar, PA-C  insulin glargine (LANTUS) 100 UNIT/ML injection Use 4 units in morning and 6 units at bedtime. 05/19/18  Yes Love, Ivan Anchors, PA-C  levothyroxine (SYNTHROID, LEVOTHROID) 25 MCG tablet Take 1 tablet (25 mcg total) by mouth daily before breakfast. 10/02/17  Yes Larey Dresser, MD  multivitamin (RENA-VIT) TABS tablet Take 1 tablet by mouth at bedtime. 05/19/18  Yes Shirley Friar, PA-C  pantoprazole (PROTONIX) 40 MG tablet Take 1 tablet (40 mg total) by mouth 2 (two) times daily. 05/19/18  Yes Love, Ivan Anchors, PA-C  polyethylene glycol (MIRALAX / GLYCOLAX) packet Take 17 g by mouth daily as needed for mild constipation. 05/19/18  Yes Shirley Friar, PA-C  senna-docusate (SENOKOT-S) 8.6-50 MG tablet Take 1 tablet by mouth 2 (two) times daily. 05/05/18  Yes Shirley Friar, PA-C  sildenafil (REVATIO) 20 MG tablet Take 1 tablet (20 mg total) by mouth 3 (three) times daily. 05/19/18  Yes Love, Ivan Anchors, PA-C  traMADol (ULTRAM) 50 MG tablet Take 1-2 tablets (50-100 mg total) by mouth every 12 (twelve) hours as needed for up to 7 days for severe pain. 05/19/18 05/26/18 Yes TillerySatira Mccallum, PA-C  traZODone (DESYREL) 50 MG tablet Take 0.5 tablets (25 mg total) by mouth at bedtime. 05/19/18  Yes Shirley Friar, PA-C  warfarin (COUMADIN) 2.5 MG tablet Take 5 mg (2 tabs) M/W/F and 2.5 mg (1 tab) Tue/Thurs/Sat/Sunday Patient taking differently: Take 2.5-5 mg by mouth See admin instructions. Take 5 mg on M/W/F and 2.5 mg on Tue/Thurs/Sat/Sunday  05/19/18  Yes Tillery, Satira Mccallum, PA-C    Family History Family History  Problem Relation Age of Onset  . Heart failure Father   . Heart attack Father   . Healthy Mother   . Diabetes Paternal Grandfather     Social History Social History   Tobacco Use  . Smoking status: Never Smoker  . Smokeless tobacco: Never Used  Substance Use Topics  . Alcohol use: No  . Drug use: No     Allergies  Patient has no known allergies.   Review of Systems Review of Systems  Respiratory: Positive for shortness of breath.   All other systems reviewed and are negative.    Physical Exam Updated Vital Signs BP (!) 117/101   Pulse 89   Temp 98.5 F (36.9 C) (Oral)   Resp 18   Ht 5\' 5"  (1.651 m)   Wt 71.7 kg (158 lb)   SpO2 100%   BMI 26.29 kg/m   Physical Exam  Constitutional: He is oriented to person, place, and time. He appears well-developed and well-nourished.  HENT:  Head: Normocephalic and atraumatic.  Eyes: Pupils are equal, round, and reactive to light. EOM are normal.  Neck: Normal range of motion. Neck supple.  Cardiovascular: Normal rate and regular rhythm.  Pulmonary/Chest: Effort normal. He has rhonchi.  Abdominal: Soft. Bowel sounds are normal.  Musculoskeletal: Normal range of motion.       Right lower leg: Normal.       Left lower leg: Normal.  Neurological: He is alert and oriented to person, place, and time.  Skin: Skin is warm. Capillary refill takes less than 2 seconds.  Psychiatric: He has a normal mood and affect. His behavior is normal.  Nursing note and vitals reviewed.    ED Treatments / Results  Labs (all labs ordered are listed, but only abnormal results are displayed) Labs Reviewed  BASIC METABOLIC PANEL - Abnormal; Notable for the following components:      Result Value   Potassium 2.6 (*)    Chloride 94 (*)    Glucose, Bld 216 (*)    BUN 6 (*)    Creatinine, Ser 2.09 (*)    Calcium 8.1 (*)    GFR calc non Af Amer 30 (*)    GFR  calc Af Amer 35 (*)    All other components within normal limits  CBC WITH DIFFERENTIAL/PLATELET - Abnormal; Notable for the following components:   WBC 11.3 (*)    RBC 2.94 (*)    Hemoglobin 8.4 (*)    HCT 28.3 (*)    MCHC 29.7 (*)    RDW 18.5 (*)    Neutro Abs 9.5 (*)    Lymphs Abs 0.6 (*)    Abs Immature Granulocytes 0.2 (*)    All other components within normal limits  BRAIN NATRIURETIC PEPTIDE - Abnormal; Notable for the following components:   B Natriuretic Peptide 1,084.4 (*)    All other components within normal limits  LACTATE DEHYDROGENASE - Abnormal; Notable for the following components:   LDH 222 (*)    All other components within normal limits  PROTIME-INR  I-STAT TROPONIN, ED    EKG EKG Interpretation  Date/Time:  Monday May 25 2018 19:15:23 EDT Ventricular Rate:  81 PR Interval:    QRS Duration: 182 QT Interval:  525 QTC Calculation: 563 R Axis:   -135 Text Interpretation:  Atrial fibrillation Paired ventricular premature complexes Right bundle branch block Artifact in lead(s) II III aVL aVF V2 V3 V4 V5 V6 and baseline wander in lead(s) V5 No significant change since last tracing Confirmed by Isla Pence [phone]) on 05/25/2018 9:17:31 PM   Radiology Dg Chest Port 1 View  Result Date: 05/25/2018 CLINICAL DATA:  Shortness of breath EXAM: PORTABLE CHEST 1 VIEW COMPARISON:  05/14/2018, 03/09/2018, CT 03/16/2018 FINDINGS: Right-sided pacing device as before. Post sternotomy changes with valve prosthesis. Similar positioning of the visible portions of the LVAD. Stable borderline cardiomegaly. No acute airspace disease or effusion.  No pneumothorax. Scoliosis of the spine. IMPRESSION: No active disease. Electronically Signed   By: Donavan Foil M.D.   On: 05/25/2018 19:21    Procedures Procedures (including critical care time)  Medications Ordered in ED Medications  ipratropium-albuterol (DUONEB) 0.5-2.5 (3) MG/3ML nebulizer solution 3 mL (3 mLs Nebulization  Given 05/25/18 2034)  potassium chloride SA (K-DUR,KLOR-CON) CR tablet 40 mEq (40 mEq Oral Given 05/25/18 2106)     Initial Impression / Assessment and Plan / ED Course  I have reviewed the triage vital signs and the nursing notes.  Pertinent labs & imaging results that were available during my care of the patient were reviewed by me and considered in my medical decision making (see chart for details).  Pt d/w Dr. Horton Chin fellow.  He did come to see pt.  The pt is feeling better after neb and is stable for d/c.  He was given potassium prior to d/c.  Return if worse.  Final Clinical Impressions(s) / ED Diagnoses   Final diagnoses:  ESRD on hemodialysis ([place])  LVAD (left ventricular assist device) present [facility])  Chronic anemia  Hypokalemia    ED Discharge Orders    None       Isla Pence, MD 05/25/18 2142

## 2018-05-25 NOTE — Consult Note (Signed)
Advanced Heart Failure Team Consult Note   Primary Physician: Janith Lima, MD PCP-Cardiologist:  No primary care provider on file.  Reason for Consultation: Shortness of breath.   HPI:    Trevor [name] is seen today for evaluation of dyspnea at the request of Dr. Gilford Raid.   Trevor [name] a 72 y.o.malewith a history of CAD s/p CABG x 4 2010, OSA, AS with TAVR 2015, ESRD on HD,DM2,chronicatrial fibrillation, asthma,GI bleed,and ischemic cardiomyopathy with Medtronic ICD.S/PHMII LVAD for Destination therapy on 12/14/2015.  He had a prolonged hospitalization earlier this summer with GI bleeding, ended up being transferred to [facility] for surgical treatment.  Transferred back from [place] on 04/30/18 after undergoing enterotomy and clipping of a colonic Dueilafoy lesion as well I&D of septic left knee.  He was recently discharged from [facility].    He was doing well after discharge without significant dyspnea.  He has a chronic cough.  He went to HD as usual today and says that they took off a typical amount of fluid.  No problems at HD.  However, when he got home, he felt progressively short of breath.  No fever, no BRBPR/melena.  Hgb today is 8.4 which is fairly stable.  CXR showed no PNA or edema.  Of note, he has had episodes of dyspnea that come and go and are difficult to explain, often after HD.   LVAD interrogation: HM3 flow 5.5 L/min, speed 9200 rpm, PI 5.3, power 5.6 W.  5 PI events.   Review of Systems: All systems reviewed and negative except as per HPI.   Home Medications Prior to Admission medications   Medication Sig Start Date End Date Taking? Authorizing Provider  acetaminophen (TYLENOL) 325 MG tablet Take 2 tablets (650 mg total) by mouth every 4 (four) hours as needed for headache or mild pain. 03/10/18   Shirley Friar, PA-C  albuterol (PROVENTIL HFA;VENTOLIN HFA) 108 (90 Base) MCG/ACT inhaler Inhale 2 puffs into the lungs every 4 (four) hours as needed  for wheezing or shortness of breath. 02/10/18   Larey Dresser, MD  atorvastatin (LIPITOR) 40 MG tablet TAKE 1 TABLET(40 MG) BY MOUTH DAILY Patient taking differently: Take 40 mg by mouth daily at 6 PM.  09/02/17   Larey Dresser, MD  benzonatate (TESSALON) 100 MG capsule Take 1 capsule (100 mg total) by mouth 3 (three) times daily as needed for cough. 05/05/18   Shirley Friar, PA-C  busPIRone (BUSPAR) 5 MG tablet Take 1 tablet (5 mg total) by mouth 2 (two) times daily. 01/22/18   Bensimhon, Shaune Pascal, MD  calcitRIOL (ROCALTROL) 0.5 MCG capsule Take 1 capsule (0.5 mcg total) by mouth every Monday, Wednesday, and Friday with hemodialysis. 03/11/18   Shirley Friar, PA-C  Darbepoetin Alfa (ARANESP) 200 MCG/0.4ML SOSY injection Inject 0.4 mLs (200 mcg total) into the vein every Monday with hemodialysis. 04/06/18   Clegg, Amy D, NP  fluticasone (FLOVENT HFA) 110 MCG/ACT inhaler Inhale 2 puffs into the lungs 2 (two) times daily. 02/10/18   Larey Dresser, MD  hydrocerin (EUCERIN) CREA Apply 1 application topically 2 (two) times daily. 05/19/18   Shirley Friar, PA-C  insulin glargine (LANTUS) 100 UNIT/ML injection Use 4 units in morning and 6 units at bedtime. 05/19/18   Love, Ivan Anchors, PA-C  levothyroxine (SYNTHROID, LEVOTHROID) 25 MCG tablet Take 1 tablet (25 mcg total) by mouth daily before breakfast. 10/02/17   Larey Dresser, MD  multivitamin (RENA-VIT) TABS  tablet Take 1 tablet by mouth at bedtime. 05/19/18   Shirley Friar, PA-C  pantoprazole (PROTONIX) 40 MG tablet Take 1 tablet (40 mg total) by mouth 2 (two) times daily. 05/19/18   Love, Ivan Anchors, PA-C  polyethylene glycol (MIRALAX / GLYCOLAX) packet Take 17 g by mouth daily as needed for mild constipation. 05/19/18   Shirley Friar, PA-C  senna-docusate (SENOKOT-S) 8.6-50 MG tablet Take 1 tablet by mouth 2 (two) times daily. 05/05/18   Shirley Friar, PA-C  sildenafil (REVATIO) 20 MG tablet Take 1  tablet (20 mg total) by mouth 3 (three) times daily. 05/19/18   Love, Ivan Anchors, PA-C  traMADol (ULTRAM) 50 MG tablet Take 1-2 tablets (50-100 mg total) by mouth every 12 (twelve) hours as needed for up to 7 days for severe pain. 05/19/18 05/26/18  Shirley Friar, PA-C  traZODone (DESYREL) 50 MG tablet Take 0.5 tablets (25 mg total) by mouth at bedtime. 05/19/18   Shirley Friar, PA-C  warfarin (COUMADIN) 2.5 MG tablet Take 5 mg (2 tabs) M/W/F and 2.5 mg (1 tab) Tue/Thurs/Sat/Sunday Patient taking differently: Take 2.5-5 mg by mouth See admin instructions. Take 5 mg on M/W/F and 2.5 mg on Tue/Thurs/Sat/Sunday 05/19/18   Shirley Friar, PA-C    Past Medical History: Past Medical History:  Diagnosis Date  . AICD (automatic cardioverter/defibrillator) present   . Asthma   . AVM (arteriovenous malformation) of colon 07/07/2017  . CHF (congestive heart failure) ([place])   . Diabetes ([place])   . ESRF (end stage renal failure) ([place]) 07/07/2017   pt receiving hemodialysis Monday- Wednesday-friday, sometimes Saturday  . Kidney disease   . LVAD (left ventricular assist device) present ([place]) 12/2016  . Permanent atrial fibrillation ([place]) 07/07/2017  . Presence of permanent cardiac pacemaker    AICD  . Sleep apnea     Past Surgical History: Past Surgical History:  Procedure Laterality Date  . AORTIC VALVE REPLACEMENT  2015   [place]  . APPLICATION OF A-CELL OF EXTREMITY Left 11/20/2017   Procedure: APPLICATION OF A-CELL;  Surgeon: Wallace Going, DO;  Location: [place];  Service: Plastics;  Laterality: Left;  . AV FISTULA PLACEMENT Left 06/30/2017   Procedure: CREATION of LEFT ARM Brachiocephalic Fistula;  Surgeon: Conrad [place], MD;  Location: [place];  Service: Vascular;  Laterality: Left;  . CARDIAC CATHETERIZATION N/A 12/02/2016   Procedure: Right Heart Cath;  Surgeon: Larey Dresser, MD;  Location: [place] CV LAB;  Service: Cardiovascular;  Laterality: N/A;  . COLONOSCOPY  N/A 02/14/2017   Procedure: COLONOSCOPY;  Surgeon: Milus Banister, MD;  Location: [place];  Service: Endoscopy;  Laterality: N/A;  . COLONOSCOPY WITH PROPOFOL N/A 02/27/2018   Procedure: COLONOSCOPY WITH PROPOFOL;  Surgeon: Jerene Bears, MD;  Location: [place];  Service: Gastroenterology;  Laterality: N/A;  . CORONARY ANGIOPLASTY WITH STENT PLACEMENT  2013   in [place]  . CORONARY ARTERY BYPASS GRAFT  2010   in [place]  . DIALYSIS/PERMA CATHETER INSERTION  06/30/2017   Procedure: INSERTION Dialysis Catheter;  Surgeon: Conrad , MD;  Location: [facility] OR;  Service: Vascular;;  . ESOPHAGOGASTRODUODENOSCOPY (EGD) WITH PROPOFOL N/A 02/27/2018   Procedure: ESOPHAGOGASTRODUODENOSCOPY (EGD) WITH PROPOFOL;  Surgeon: Jerene Bears, MD;  Location: [facility] ENDOSCOPY;  Service: Gastroenterology;  Laterality: N/A;  . GIVENS CAPSULE STUDY N/A 02/27/2018   Procedure: GIVENS CAPSULE STUDY;  Surgeon: Jerene Bears, MD;  Location: [place];  Service: Gastroenterology;  Laterality: N/A;  .  GIVENS CAPSULE STUDY N/A 03/25/2018   Procedure: GIVENS CAPSULE STUDY;  Surgeon: Jackquline Denmark, MD;  Location: [facility] ENDOSCOPY;  Service: Endoscopy;  Laterality: N/A;  . IABP INSERTION N/A 12/11/2016   Procedure: IABP Insertion;  Surgeon: Larey Dresser, MD;  Location: [place] CV LAB;  Service: Cardiovascular;  Laterality: N/A;  . INSERTION OF IMPLANTABLE LEFT VENTRICULAR ASSIST DEVICE N/A 12/13/2016   Procedure: INSERTION OF IMPLANTABLE LEFT VENTRICULAR ASSIST DEVICE;  Surgeon: Ivin Poot, MD;  Location: [place];  Service: Open Heart Surgery;  Laterality: N/A;  HEARTMATE II  NITRIC OXIDE  . IR FLUORO GUIDE CV LINE LEFT  06/17/2017  . IR US GUIDE VASC ACCESS LEFT  06/17/2017  . MASS EXCISION Left 11/20/2017   Procedure: EXCISION OF LEFT NECK AND RIGHT LEG SKIN CANCER WITH A CELL PLACEMENT;  Surgeon: Wallace Going, DO;  Location: [place];  Service: Plastics;  Laterality: Left;  Marland Kitchen MASS EXCISION Left 11/26/2017    Procedure: EXPLORATION AND EXCISION OF LEFT NECK;  Surgeon: Wallace Going, DO;  Location: [place];  Service: Plastics;  Laterality: Left;  . RIGHT HEART CATH N/A 12/11/2016   Procedure: Right Heart Cath;  Surgeon: Larey Dresser, MD;  Location: [place] CV LAB;  Service: Cardiovascular;  Laterality: N/A;  . RIGHT HEART CATH N/A 03/27/2017   Procedure: Right Heart Cath;  Surgeon: Larey Dresser, MD;  Location: [place] CV LAB;  Service: Cardiovascular;  Laterality: N/A;  . RIGHT HEART CATH N/A 06/13/2017   Procedure: RIGHT HEART CATH;  Surgeon: Jolaine Artist, MD;  Location: [place] CV LAB;  Service: Cardiovascular;  Laterality: N/A;  . TEE WITHOUT CARDIOVERSION N/A 12/13/2016   Procedure: TRANSESOPHAGEAL ECHOCARDIOGRAM (TEE);  Surgeon: Ivin Poot, MD;  Location: [place];  Service: Open Heart Surgery;  Laterality: N/A;  . TRICUSPID VALVE REPLACEMENT N/A 12/13/2016   Procedure: TRICUSPID VALVE REPAIR WITH EDWARDS MC 3 TRICUSPID ANNULOPLASTY RING MODEL 4900 SIZE T 28;  Surgeon: Ivin Poot, MD;  Location: [place];  Service: Open Heart Surgery;  Laterality: N/A;    Family History: Family History  Problem Relation Age of Onset  . Heart failure Father   . Heart attack Father   . Healthy Mother   . Diabetes Paternal Grandfather     Social History: Social History   Socioeconomic History  . Marital status: Widowed    Spouse name: Not on file  . Number of children: 0  . Years of education: 7  . Highest education level: Not on file  Occupational History  . Not on file  Social Needs  . Financial resource strain: Not on file  . Food insecurity:    Worry: Not on file    Inability: Not on file  . Transportation needs:    Medical: Not on file    Non-medical: Not on file  Tobacco Use  . Smoking status: Never Smoker  . Smokeless tobacco: Never Used  Substance and Sexual Activity  . Alcohol use: No  . Drug use: No  . Sexual activity: Not Currently  Lifestyle  .  Physical activity:    Days per week: Not on file    Minutes per session: Not on file  . Stress: Not on file  Relationships  . Social connections:    Talks on phone: Not on file    Gets together: Not on file    Attends religious service: Not on file    Active member of club or organization:  Not on file    Attends meetings of clubs or organizations: Not on file    Relationship status: Not on file  Other Topics Concern  . Not on file  Social History Narrative   Patient is a widow. Moved to [place] from [place], [place]. Currently lives with his sister Ethen Bannan.    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     Allergies:  No Known Allergies  Objective:    Vital Signs:   Temp:  [98.5 F (36.9 C)] 98.5 F (36.9 C) (07/22 1848) BP: (117)/(101) 117/101 (07/22 1848) SpO2:  [98 %] 98 % (07/22 1848) Weight:  [158 lb (71.7 kg)] 158 lb (71.7 kg) (07/22 1849)    Weight change: Filed Weights   05/25/18 1849  Weight: 158 lb (71.7 kg)    Intake/Output:  No intake or output data in the 24 hours ending 05/25/18 2023    Physical Exam    General: Well appearing this am. NAD.  HEENT: Normal. Neck: Supple, JVP 8-9 cm. Carotids OK.  Cardiac:  Mechanical heart sounds with LVAD hum present.  Lungs:  CTAB, normal effort.  Abdomen:  NT, ND, no HSM. No bruits or masses. +BS  LVAD exit site: Well-healed and incorporated. Dressing dry and intact. No erythema or drainage. Stabilization device present and accurately applied. Driveline dressing changed daily per sterile technique. Extremities:  Warm and dry. No cyanosis, clubbing, rash.  1+ edema to knees bilaterally.   Neuro:  Alert & oriented x 3. Cranial nerves grossly intact. Moves all 4 extremities w/o difficulty. Affect pleasant     Telemetry   Atrial fibrillation with PVCs (personally reviewed)  EKG    Atrial fibrillation with PVCs (personally reviewed)  Labs   Basic Metabolic Panel: Recent Labs  Lab  05/19/18 0549  NA 140  K 3.4*  CL 96*  CO2 34*  GLUCOSE 161*  BUN 12  CREATININE 3.05*  CALCIUM 8.0*    Liver Function Tests: No results for input(s): AST, ALT, ALKPHOS, BILITOT, PROT, ALBUMIN in the last 168 hours. No results for input(s): LIPASE, AMYLASE in the last 168 hours. No results for input(s): AMMONIA in the last 168 hours.  CBC: Recent Labs  Lab 05/19/18 0549 05/25/18 1905  WBC 8.6 11.3*  NEUTROABS  --  9.5*  HGB 8.6* 8.4*  HCT 28.8* 28.3*  MCV 95.4 96.3  PLT 189 190    Cardiac Enzymes: No results for input(s): CKTOTAL, CKMB, CKMBINDEX, TROPONINI in the last 168 hours.  BNP: BNP (last 3 results) No results for input(s): BNP in the last 8760 hours.  ProBNP (last 3 results) No results for input(s): PROBNP in the last 8760 hours.   CBG: Recent Labs  Lab 05/18/18 2152 05/19/18 0637 05/19/18 0958 05/19/18 1202  GLUCAP 208* 155* 210* 169*    Coagulation Studies: No results for input(s): LABPROT, INR in the last 72 hours.   Imaging   Dg Chest Port 1 View  Result Date: 05/25/2018 CLINICAL DATA:  Shortness of breath EXAM: PORTABLE CHEST 1 VIEW COMPARISON:  05/14/2018, 03/09/2018, CT 03/16/2018 FINDINGS: Right-sided pacing device as before. Post sternotomy changes with valve prosthesis. Similar positioning of the visible portions of the LVAD. Stable borderline cardiomegaly. No acute airspace disease or effusion. No pneumothorax. Scoliosis of the spine. IMPRESSION: No active disease. Electronically Signed   By: Donavan Foil M.D.   On: 05/25/2018 19:21      Medications:     Current Medications: . ipratropium-albuterol  3 mL  Nebulization Once     Infusions:     Assessment/Plan   1. Dyspnea: This started after HD today.  He had a normal HD today, says they took off a typical amount of fluid.  CXR with no PNA or edema.  He is afebrile.  Exam is usual for him, mild volume overload.  Hgb is stable at 8.4, no overt GI bleeding.  He has had  episodes like this after HD in the past, ?due to fluid shifts.  - Will give a Duoneb treatment.  If he feels ok after this, can go home.  2. GI Bleed/Symptomatic Anemia:GI AVMs. Double balloon retro endoscopy at [facility], unable to identify bleeding lesion. He failed attempted IR embolization x 3 while at [facility]. 12 units PRBCs at [facility]. Received 15 uPRBC while at [facility]. IR consulted, but did not find any active bleeding on CTA 5/20. Repeat capsule study showed active bleeding in the mid ileum.Surgery was reconsulted and recommended transfer back to [facility] for possible surgery.Now s/p laparoscopic surgical enteroscopy with 4 clips on 04/10/18 at [facility], c/b ischemic bowel/SBO. No evidence currently for overt bleeding. Hgb is stable at 8.4.  - Tolerating warfarin. Goal INR 1.8-2.3, adjust to keep in this range. No ASA. Pending INR.  - Getting Aranesp per nephrology.  - He will continue octreotide as outpatient.  3. Chronic Systolic Heart Failure/RV Failure:ICM. S/pHM II LVAD placed 2065for DT.Has Medtronic ICD.Volume controlled by HD. MWF/Sat schedule. LVAD parameters interrogated today and stable.  - Pending LDH.  -Continue sildenafil20mg  tid. 4. ESRD: M/W/F/Sa: HD per Renal.Continue MWF/Sa schedule. No change.   5. Chronic Afib: Rate controlled.  6.Hx ofCAD s/pCABG. - Continue statin.   7. Hx ofTAVR:Valvestable on last echo2/2019.No change.   If LDH is stable and he is feeling ok after breathing treatment, may go home.   Length of Stay: 0  Loralie Champagne, MD  05/25/2018, 8:23 PM  Advanced Heart Failure Team Pager [phone] (M-F; 7a - 4p)  Please contact [facility] for night-coverage after hours (4p -7a ) and weekends on [email or web address]

## 2018-05-25 NOTE — ED Triage Notes (Signed)
Pt arrives via EMS from home with reports of increasing SOB since this morning. Pt took nebulizer at home with no relief. Cap refill 4 seconds. Hx anemia with blood transfusions. Recent hospitalization with surgery. Placed on 3L [place] by EMS. LVAD pt.

## 2018-05-28 ENCOUNTER — Ambulatory Visit (HOSPITAL_COMMUNITY): Payer: Self-pay | Admitting: Pharmacist

## 2018-05-28 ENCOUNTER — Ambulatory Visit (HOSPITAL_COMMUNITY)
Admission: RE | Admit: 2018-05-28 | Discharge: 2018-05-28 | Disposition: A | Discharge status: Home / Self Care | Payer: Medicare Other | Source: Ambulatory Visit | Attending: Cardiology | Admitting: Cardiology

## 2018-05-28 ENCOUNTER — Other Ambulatory Visit (HOSPITAL_COMMUNITY): Payer: Self-pay | Admitting: *Deleted

## 2018-05-28 VITALS — BP 103/61 | HR 87 | Temp 98.1°F | Wt 168.6 lb

## 2018-05-28 DIAGNOSIS — N186 End stage renal disease: Secondary | ICD-10-CM | POA: Diagnosis not present

## 2018-05-28 DIAGNOSIS — B9689 Other specified bacterial agents as the cause of diseases classified elsewhere: Secondary | ICD-10-CM | POA: Insufficient documentation

## 2018-05-28 DIAGNOSIS — I255 Ischemic cardiomyopathy: Secondary | ICD-10-CM | POA: Diagnosis not present

## 2018-05-28 DIAGNOSIS — Z952 Presence of prosthetic heart valve: Secondary | ICD-10-CM | POA: Diagnosis not present

## 2018-05-28 DIAGNOSIS — Z4509 Encounter for adjustment and management of other cardiac device: Secondary | ICD-10-CM | POA: Insufficient documentation

## 2018-05-28 DIAGNOSIS — Z833 Family history of diabetes mellitus: Secondary | ICD-10-CM | POA: Insufficient documentation

## 2018-05-28 DIAGNOSIS — M7989 Other specified soft tissue disorders: Secondary | ICD-10-CM | POA: Insufficient documentation

## 2018-05-28 DIAGNOSIS — I482 Chronic atrial fibrillation: Secondary | ICD-10-CM | POA: Insufficient documentation

## 2018-05-28 DIAGNOSIS — J45909 Unspecified asthma, uncomplicated: Secondary | ICD-10-CM | POA: Diagnosis not present

## 2018-05-28 DIAGNOSIS — Z7901 Long term (current) use of anticoagulants: Secondary | ICD-10-CM

## 2018-05-28 DIAGNOSIS — M00862 Arthritis due to other bacteria, left knee: Secondary | ICD-10-CM | POA: Diagnosis not present

## 2018-05-28 DIAGNOSIS — Z79890 Hormone replacement therapy (postmenopausal): Secondary | ICD-10-CM | POA: Insufficient documentation

## 2018-05-28 DIAGNOSIS — Z8249 Family history of ischemic heart disease and other diseases of the circulatory system: Secondary | ICD-10-CM | POA: Insufficient documentation

## 2018-05-28 DIAGNOSIS — G4733 Obstructive sleep apnea (adult) (pediatric): Secondary | ICD-10-CM | POA: Insufficient documentation

## 2018-05-28 DIAGNOSIS — E1122 Type 2 diabetes mellitus with diabetic chronic kidney disease: Secondary | ICD-10-CM | POA: Diagnosis not present

## 2018-05-28 DIAGNOSIS — I132 Hypertensive heart and chronic kidney disease with heart failure and with stage 5 chronic kidney disease, or end stage renal disease: Secondary | ICD-10-CM | POA: Insufficient documentation

## 2018-05-28 DIAGNOSIS — I5022 Chronic systolic (congestive) heart failure: Secondary | ICD-10-CM | POA: Diagnosis not present

## 2018-05-28 DIAGNOSIS — Z95811 Presence of heart assist device: Secondary | ICD-10-CM | POA: Insufficient documentation

## 2018-05-28 DIAGNOSIS — K284 Chronic or unspecified gastrojejunal ulcer with hemorrhage: Secondary | ICD-10-CM | POA: Diagnosis not present

## 2018-05-28 DIAGNOSIS — D631 Anemia in chronic kidney disease: Secondary | ICD-10-CM | POA: Diagnosis not present

## 2018-05-28 DIAGNOSIS — Z85828 Personal history of other malignant neoplasm of skin: Secondary | ICD-10-CM | POA: Insufficient documentation

## 2018-05-28 DIAGNOSIS — Z951 Presence of aortocoronary bypass graft: Secondary | ICD-10-CM | POA: Diagnosis not present

## 2018-05-28 DIAGNOSIS — I480 Paroxysmal atrial fibrillation: Secondary | ICD-10-CM | POA: Insufficient documentation

## 2018-05-28 DIAGNOSIS — L409 Psoriasis, unspecified: Secondary | ICD-10-CM | POA: Diagnosis not present

## 2018-05-28 DIAGNOSIS — M25562 Pain in left knee: Secondary | ICD-10-CM | POA: Diagnosis not present

## 2018-05-28 DIAGNOSIS — D638 Anemia in other chronic diseases classified elsewhere: Secondary | ICD-10-CM

## 2018-05-28 DIAGNOSIS — Z79899 Other long term (current) drug therapy: Secondary | ICD-10-CM | POA: Insufficient documentation

## 2018-05-28 DIAGNOSIS — I2510 Atherosclerotic heart disease of native coronary artery without angina pectoris: Secondary | ICD-10-CM | POA: Insufficient documentation

## 2018-05-28 DIAGNOSIS — Z9889 Other specified postprocedural states: Secondary | ICD-10-CM | POA: Insufficient documentation

## 2018-05-28 DIAGNOSIS — Z794 Long term (current) use of insulin: Secondary | ICD-10-CM | POA: Insufficient documentation

## 2018-05-28 LAB — BASIC METABOLIC PANEL
Anion gap: 12 (ref 5–15)
BUN: 11 mg/dL (ref 8–23)
CALCIUM: 8.6 mg/dL — AB (ref 8.9–10.3)
CHLORIDE: 94 mmol/L — AB (ref 98–111)
CO2: 31 mmol/L (ref 22–32)
Creatinine, Ser: 2.61 mg/dL — ABNORMAL HIGH (ref 0.61–1.24)
GFR calc Af Amer: 27 mL/min — ABNORMAL LOW (ref >60)
GFR, EST NON AFRICAN AMERICAN: 23 mL/min — AB (ref >60)
Glucose, Bld: 102 mg/dL — ABNORMAL HIGH (ref 70–99)
Potassium: 3.5 mmol/L (ref 3.5–5.1)
Sodium: 137 mmol/L (ref 135–145)

## 2018-05-28 LAB — CBC
HEMATOCRIT: 28.8 % — AB (ref 39.0–52.0)
HEMOGLOBIN: 8.5 g/dL — AB (ref 13.0–17.0)
MCH: 28.6 pg (ref 26.0–34.0)
MCHC: 29.5 g/dL — AB (ref 30.0–36.0)
MCV: 97 fL (ref 78.0–100.0)
Platelets: 212 10*3/uL (ref 150–400)
RBC: 2.97 MIL/uL — ABNORMAL LOW (ref 4.22–5.81)
RDW: 18.8 % — AB (ref 11.5–15.5)
WBC: 13.3 10*3/uL — ABNORMAL HIGH (ref 4.0–10.5)

## 2018-05-28 LAB — PROTIME-INR
INR: 1.65
Prothrombin Time: 19.4 seconds — ABNORMAL HIGH (ref 11.4–15.2)

## 2018-05-28 LAB — LACTATE DEHYDROGENASE: LDH: 230 U/L — ABNORMAL HIGH (ref 98–192)

## 2018-05-28 NOTE — Progress Notes (Signed)
LVAD Coordinator VAD Clinic Note:  Presents in [place] clinic today for his hospital follow up. He says that he has been feeling ok. After dialysis he reports does not feel well at all. He is having some mobility difficulty due to left knee pain and swelling. Full interrogation of Medtronic ICD per Tomi Bamberger. No issues identified. Patient unable to establish care in device clinic due to physical limitations at this time.   Vital signs: Temp:  98.1 HR: 87 Automatic BP:  103/61 (76) Doppler: 76 O2 Sat: 96 Wt:174>166>160>154>169>161 lbs (hospital weights)   Last clinic weight in April- 192.6lb  Clinic weight today- 168.6lb  LVAD interrogation reveals:  Speed:  9200 Flow:  5.2 Power: 5.6 PI: 6.3 Alarms: none Events: 5-10 PI  Fixed speed: 9200 Low speed limit: 8600   Drive Line:  Existing VAD dressing removed and site care performed using sterile technique. Drive line exit site cleaned Chlora prep swab x2, then with saline. Allowed to dry, and Sorbaview dressing without bio patch re-applied. Exit site healed and incorporated, the velour is fully implanted at exit site. No redness, tenderness, drainage, foul odor or rash noted. Drive line anchor re-applied. Pt denies fever or chills.   Labs:  LDH trend: 297>277>282>260>251>241>212>138>235>218>211>187 Today's: 230  INR trend: 4.51>4.71>3.08>2.32>1.99>2.0>1.97>2.18>1.90>2.09>2.31>1.83 Today's: 1.65  Hgb: 8.5  Anticoagulation Plan: - INR Goal: 1.8-2.3 - ASA Dose: none due to hx of GI bleed - OP monthly Octreotide for hx of GI bleed  Device: - Medtronic single lead -Therapies: on 231 bpm  Arrythmias: chronic afib  Renal:  - chronic HD on M/W/F/Sat at 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 Margins not clear at neck. - 02/23/18> hospitalization for GI bleed. Colon/EGD  02/27/18 with normal EGD. Colonoscopy identified active bleeding in ileum; source not found. Capsule Endoscopy 02/27/18: Showed active bleeding in the ileum. Received 5 units blood with histamine reaction; will give benadryl as OP. - 03/10/18>transferrred to [facility] for double retro balloon scop and sent to IR emolization x 3 with no evidence of bleeding. Received 12 units PCs, remained off anticoagulants. Transferred back to [facility] 03/20/18 Seen by IR 5/20. CTA obtained, but no active bleeding found. Started on octreotide. 03/25/18: Repeat capsule endoscopy completed. Bleeding appears to come from the mid-ileum. 03/26/18: General surgery reconsulted. Recommended transfer back to [facility] for surgery with intraoperative endoscopy. [name] currently discussing options. Ortho consulted 5/24 for left knee pain. S/p knee aspiration and steroid injection.  Dose of premarin given 5/24.  On 5/25 developed abdominal bloating and distension with n/v. KUB suggestive of ileus versus early SBO. NGT placed for decompression.  Had large bloody BM after this and felt much better, diet now advanced to regular.  - 04/01/18 transferred to [facility] for  laparoscopic surgical enteroscopy for recurrent GI bleed   Plan/Recommendations: 1. Return to [place] clinic August 6th 1000 for follow up. 2. Call VAD pager if any questions re: VAD equipment or drive line site issues.   Emerson Monte RN, VAD Coordinator 24/7 VAD pager: [phone]

## 2018-05-28 NOTE — Patient Instructions (Signed)
1. Follow up with VAD clinic in 1 week.

## 2018-05-28 NOTE — Progress Notes (Signed)
LVAD Coordinator VAD Clinic Note:  Presents in [facility] clinic today for his hospital follow up. He says that he has been feeling ok. After dialysis he reports does not feel well at all. He is having some mobility difficulty due to left knee pain and swelling. Full interrogation of Medtronic ICD per Tomi Bamberger. No issues identified. Patient unable to establish care in device clinic due to physical limitations at this time.   Vital signs: Temp:  98.1 HR: 87 Automatic BP:  103/61 (76) Doppler: 76 O2 Sat: 96 Wt:174>166>160>154>169>161 lbs (hospital weights)              Last clinic weight in April- 192.6lb             Clinic weight today- 168.6lb  LVAD interrogation reveals:  Speed:  9200 Flow:  5.2 Power: 5.6 PI: 6.3 Alarms: none Events: 5-10 PI  Fixed speed: 9200 Low speed limit: 8600   Drive Line:  Existing VAD dressing removed and site care performed using sterile technique. Drive line exit site cleaned Chlora prep swab x2, then with saline. Allowed to dry, and Sorbaview dressing without bio patch re-applied. Exit site healed and incorporated, the velour is fully implanted at exit site. No redness, tenderness, drainage, foul odor or rash noted. Drive line anchor re-applied. Pt denies fever or chills.   Labs:  LDH trend: 297>277>282>260>251>241>212>138>235>218>211>187 Today's: 230  INR trend: 4.51>4.71>3.08>2.32>1.99>2.0>1.97>2.18>1.90>2.09>2.31>1.83 Today's: 1.65  Hgb: 8.5  Anticoagulation Plan: - INR Goal: 1.8-2.3 - ASA Dose: none due to hx of GI bleed - OP monthly Octreotide for hx of GI bleed  Device: - Medtronic single lead -Therapies: on 231 bpm  Arrythmias: chronic afib  Renal:  - chronic HD on M/W/F/Sat at [facility] on [facility]  Adverse Events on VAD: - 02/2017> GIB- AVM clipped x2 - 07/2017> renal failure- HD started - 11/2017>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 Margins not clear at neck. - 02/23/18>  hospitalization for GI bleed. Colon/EGD 02/27/18 with normal EGD. Colonoscopy identified active bleeding in ileum; source not found. Capsule Endoscopy 02/27/18: Showed active bleeding in the ileum. Received 5 units blood with histamine reaction; will give benadryl as OP. - 03/10/18>transferrred to [facility] for double retro balloon scop and sent to IR emolization x 3 with no evidence of bleeding. Received 12 units PCs, remained off anticoagulants. Transferred back to [facility] 03/20/18 Seen by IR 5/20. CTA obtained, but no active bleeding found. Started on octreotide. 03/25/18: Repeat capsule endoscopy completed. Bleeding appears to come from the mid-ileum. 03/26/18: General surgery reconsulted. Recommended transfer back to [facility] for surgery with intraoperative endoscopy. [name] currently discussing options. Ortho consulted 5/24 for left knee pain. S/p knee aspiration and steroid injection.  Dose of premarin given 5/24.  On 5/25 developed abdominalbloating and distension with n/v. KUB suggestive of ileus versus early SBO. NGT placed for decompression. Had large bloody BM after this and felt much better, diet now advanced to regular.  - 04/01/18 transferred to [facility] for  laparoscopic surgical enteroscopy for recurrent GI bleed   Plan/Recommendations: 1. Return to [place] clinic August 6th 1000 for follow up. 2. Call VAD pager if any questions re: VAD equipment or drive line site issues.   Emerson Monte RN, VAD Coordinator 24/7 VAD pager: [phone]      VAD Clinic Note   Trevor [name] is a 72 y.o. male with a history of CAD s/p CABG x 4 2010, OSA, AS with TAVR 2015, CKD, DM2, paroxysmal atrial fibrillation,  asthma, and ischemic cardiomyopathy with Medtronic ICD.   Relocated to [place] from [place] in early 2018. Over the last couple of years, he had been admitted multiple times for CHF. Last admission in [place] was in 1/18. Sounds like the hospitalization was complicated by cardiorenal  syndrome and there was consideration of doing dialysis.  He was admitted in to [facility] 11/27/16 with NYHA class IV symptoms and hypotension. He was placed on dual inotropes to facilitate diuresis, cardiac output and renal function.  CT surgery consulted for mechanical support and he was deemed appropriate for LVAD work up. He completed LVAD work up and was approved for HMII LVAD --> DT. On 12/11/16, he had IABP placed to optimize cardiac output and improve renal function. He then underwent HMII LVAD placement + tricuspid valve repair on 12/14/2015. Post operatively pressors weaned off slowly. He required significant diuresis.  Course was complicated by RV dysfunction and renal dysfunction.  Patient was admitted with lower GI bleeding in 4/18.  He had been on ASA 325 (increased with mild LDH elevation) and warfarin INR 2-2.5.  He had 4 units PRBCs total.  Colonoscopy showed colonic AVM treated with APC and clipping.  He was sent home off ASA and on warfarin INR goal 2-2.5.   In 5/18, creatinine was noted to increase as high as 3.67.  I cut back on his diuretics but he became volume overloaded.  I increased diuretics again with poor response.  In 5/18, he had RHC showing elevated right and left heart filling pressures and preserved cardiac output. I admitted him for IV diuresis.  Weight and creatinine came down, creatinine was 2.9 when discharged.  Ramp echo was done and speed was increased to 9200 rpm.  Sildenafil was increased to 40 mg tid.  At the next visit, speed was increased to 9400 rpm and torsemide was decreased to 40 mg daily, later increased back to 60 mg daily with increased volume.   He continued to have problems with up and down renal function, requiring up and down adjustment of torsemide.  He finally was admitted to the hospital in 8/18 with intractable volume overload.  Efforts at diuresis were complicated by AKI.  Speed was increased to 9600 rpm in the hospital. He finally had to undergo CVVH,  then was started on intermittent HD.  He was discharged home to continue on HD.    He was admitted in 10/18 for inpatient HD due to excessive weight gain.  He is now going to HD 4 times/week.    In 1/19, he was admitted for skin cancer removal.  He had a squamous cell skin cancer removed from his left neck, margins not clear.  He had a squamous cell skin cancer removed from the right leg, margins were clear.  This admission complicated by hematoma formation at neck site requiring evacuation.  His right leg site appeared to be infected and he had a course of vancomycin.   Pt had a prolonged admission 03/2018 to 05/2018 for GI bleeding. Transferred to [facility] for surgical treatment. 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   He presents today for post hospital follow up. Seen in ED 05/25/18 with acute SOB after HD. Hgb was stable. CXR unremarkable. Noted to have WBC of 11.3. He was given nebulizer treatment with resolution of symptoms, and sent home.  He has been feeling better since.  He has nebulizers at home and has been doing twice daily to good effect. He still feels bad on HD days, just not as bad as that visit.  He says overall, he feels as if his QOL and general feeling of wellness has decreased about 30% since starting HD, and is worse every time.  He thinks it would have to get about "twice as bad" before he would consider stopping HD completely. Weight has been stable. No bleeding. Main problem currently is his left knee. Still stiff and swollen. No discharge from site. No fevers or chills. He sees ortho next Tuesday. He denies lightheadedness, syncope, or falls. No problems with driveline or neuro symptoms.   Labs (3/18): LDH 331 => 401 => 443 => 366 => 313, K 3.6 => 3.2 => 3.5, creatinine 1.98 => 1.74 => 1.8 => 1.79, hgb 8.3 => 8.9 => 8.7 => 8.4, INR 2.11 => 2.4 Labs  (4/18): hgb 9.4 Labs (5/18): LDH 269 => 249, INR 2.57, K 4.5, creatinine 1.92 => 2.71 => 3.67 => 3.43 => 3.1 => 2.93, hgb 9, plts 104 Labs (6/18): K 4.5, creatinine 3.6 => 3.43, hgb 8.5, INR 2.4, LDH 287\ Labs (7/18): K 4.5, creatinine 3.52 => 4, hgb 7.9 => 8.5 Labs (8/18): K 3.8, creatinine 4, hgb 8.5 Labs (9/18): hgb 8.2 => 9 => 9.9 => 10.6, plts 143, LDH 205 => 251 Labs (4/19): hgb 10, LDH 267  LVAD parameters: Please see nurses note above. I personally reviewed along with them.   PMH: 1. CAD: s/p CABG in 2010.  2. Aortic stenosis: s/p TAVR in 2015. Valve looked ok on 2/18 echo.  3. Atrial fibrillation: Chronic.  4. Type II diabetes.  5. ESRD  6. Chronic systolic CHF: Ischemic cardiomyopathy with prominent RV dysfunction.  Medtronic ICD.  - Echo (2/18): EF 20-25%, moderately dilated LV, moderately dilated/moderately dysfunctional RV, severe TR.  - Cardiogenic shock 2/18 requiring milrinone, norepinephrine, and IABP.  - Heartmate II LVAD for DT placed 12/13/16.   - RHC (5/18): mean RA 16, PA 55/22 mean 33, mean PCWP 23, CI 2.59, PVR 3.87 - RHC (8/18): mean RA 17, PA 56/25 mean 37, mean PCWP 20, CI 3.2 Fick/2.1 thermo 7. OSA.  8. Thrombocytopenia: Post-op LVAD, resolved.  9. Tricuspid regurgitation: Severe, s/p repair with LVAD placement in 2/18.  10. Lower GI bleed (4/18): Colonic AVM, treated with APC and clipping.  11. Psoriasis 12. Squamous cell skin cancer: left neck, right leg => removed 1/19.  Margins not clear at neck. Radiation recommended but so far he wants to hold off.   Social History   Socioeconomic History  . Marital status: Widowed    Spouse name: Not on file  . Number of children: 0  . Years of education: 62  . Highest education level: Not on file  Occupational History  . Not on file  Social Needs  . Financial resource strain: Not on file  . Food insecurity:    Worry: Not on file    Inability: Not on file  . Transportation needs:    Medical: Not on file     Non-medical: Not on file  Tobacco Use  . Smoking status: Never Smoker  . Smokeless tobacco: Never Used  Substance and Sexual Activity  . Alcohol use: No  . Drug use: No  . Sexual activity: Not Currently  Lifestyle  . Physical activity:    Days per week: Not on file  Minutes per session: Not on file  . Stress: Not on file  Relationships  . Social connections:    Talks on phone: Not on file    Gets together: Not on file    Attends religious service: Not on file    Active member of club or organization: Not on file    Attends meetings of clubs or organizations: Not on file    Relationship status: Not on file  . Intimate partner violence:    Fear of current or ex partner: Not on file    Emotionally abused: Not on file    Physically abused: Not on file    Forced sexual activity: Not on file  Other Topics Concern  . Not on file  Social History Narrative   Patient is a widow. Moved to [place] from [place], [place]. Currently lives with his sister Araf Clugston.    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    Family History  Problem Relation Age of Onset  . Heart failure Father   . Heart attack Father   . Healthy Mother   . Diabetes Paternal Grandfather    Review of systems complete and found to be negative unless listed in HPI.    Current Outpatient Medications  Medication Sig Dispense Refill  . acetaminophen (TYLENOL) 325 MG tablet Take 2 tablets (650 mg total) by mouth every 4 (four) hours as needed for headache or mild pain.    Marland Kitchen albuterol (PROVENTIL HFA;VENTOLIN HFA) 108 (90 Base) MCG/ACT inhaler Inhale 2 puffs into the lungs every 4 (four) hours as needed for wheezing or shortness of breath. 3 Inhaler 5  . atorvastatin (LIPITOR) 40 MG tablet TAKE 1 TABLET(40 MG) BY MOUTH DAILY (Patient taking differently: Take 40 mg by mouth daily at 6 PM. ) 30 tablet 11  . benzonatate (TESSALON) 100 MG capsule Take 1 capsule (100 mg total) by mouth 3 (three) times daily as  needed for cough. 20 capsule 0  . busPIRone (BUSPAR) 5 MG tablet Take 1 tablet (5 mg total) by mouth 2 (two) times daily. 180 tablet 3  . calcitRIOL (ROCALTROL) 0.5 MCG capsule Take 1 capsule (0.5 mcg total) by mouth every Monday, Wednesday, and Friday with hemodialysis.    . Darbepoetin Alfa (ARANESP) 200 MCG/0.4ML SOSY injection Inject 0.4 mLs (200 mcg total) into the vein every Monday with hemodialysis. 1.68 mL   . fluticasone (FLOVENT HFA) 110 MCG/ACT inhaler Inhale 2 puffs into the lungs 2 (two) times daily. 3 Inhaler 5  . hydrocerin (EUCERIN) CREA Apply 1 application topically 2 (two) times daily. 113 g 0  . insulin glargine (LANTUS) 100 UNIT/ML injection Use 4 units in morning and 6 units at bedtime. 10 mL 11  . levothyroxine (SYNTHROID, LEVOTHROID) 25 MCG tablet Take 1 tablet (25 mcg total) by mouth daily before breakfast. 90 tablet 3  . multivitamin (RENA-VIT) TABS tablet Take 1 tablet by mouth at bedtime. 30 tablet 6  . pantoprazole (PROTONIX) 40 MG tablet Take 1 tablet (40 mg total) by mouth 2 (two) times daily. 30 tablet 5  . polyethylene glycol (MIRALAX / GLYCOLAX) packet Take 17 g by mouth daily as needed for mild constipation. 14 each 0  . senna-docusate (SENOKOT-S) 8.6-50 MG tablet Take 1 tablet by mouth 2 (two) times daily.    . sildenafil (REVATIO) 20 MG tablet Take 1 tablet (20 mg total) by mouth 3 (three) times daily. 90 tablet 6  . traZODone (DESYREL) 50 MG tablet Take 0.5 tablets (  25 mg total) by mouth at bedtime. 15 tablet 6  . warfarin (COUMADIN) 2.5 MG tablet Take 5 mg (2 tabs) M/W/F and 2.5 mg (1 tab) Tue/Thurs/Sat/Sunday (Patient taking differently: Take 2.5-5 mg by mouth See admin instructions. Take 5 mg on M/W/F and 2.5 mg on Tue/Thurs/Sat/Sunday) 50 tablet 6   No current facility-administered medications for this encounter.    Vitals:   05/28/18 1034 05/28/18 1035  BP: (!) 76/0 103/61  Pulse: 87   Temp: 98.1 F (36.7 C)   SpO2: 96%   Weight: 168 lb 9.6 oz (76.5  kg)     MAP 76  General: Chronically ill appearing. NAD.  HEENT: Normal. Neck: Supple, JVP 7-8 cm. Carotids OK.  Cardiac:  Mechanical heart sounds with LVAD hum present.  Lungs:  CTAB, normal effort.  Abdomen:  NT, ND, no HSM. No bruits or masses. +BS  LVAD exit site: Well-healed and incorporated. Dressing dry and intact. No erythema or drainage. Stabilization device present and accurately applied. Driveline dressing changed daily per sterile technique. Extremities:  Warm and dry. No cyanosis, clubbing, or rash. Left knee with incision site. 1-2+ edema into thighs. RLE with 1+ ankle edema.  Neuro:  Alert & oriented x 3. Cranial nerves grossly intact. Moves all 4 extremities w/o difficulty. Affect pleasant     Assessment/Plan: 1. Chronic systolic CHF: - Ischemic cardiomyopathy, EF 20-25% with RV dysfunction on 2/18 echo pre-LVAD.  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  Medtronic ICD.  He has had problems with RV failure post-op.  He developed worsening renal dysfunction and finally had to start HD in 8/18. There has been some difficulty getting adequate fluid off him, currently dialyzing 4 days/week.   - NYHA III symptoms.  - Volume status elevated, but per HD. Mostly swollen in his BLEs, and primarily his left knee s/p surgical wash out and I&D.  - LDH - Continue warfarin with INR goal 1.8 - 2.3/ Mp ASA/  - Continue HD schedule as per renal.  2. CAD: s/p CABG - No s/s of ischemia.    - He is on atorvastatin.  3. ESRD:  - He has overall had poor tolerance of HD, and seems to be getting worse. Long discussion today as above. He knows he is likely to continued feeling gradually worse, and thinks he will eventually want to give up on HD, but says he would have to feel "twice as bad" as he does now. Have previously had discussions about hospice and palliative care. No change today.  4. Atrial fibrillation: Chronic.   - On warfarin. INR as below.  5. RV failure:  - Continue Revatio at 40 mg tid.   6.  HTN:  - MAP reasonable stable.  7. 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  - Hgb 8.5. Per pt, renal goal is Hgb > 11.0.  8. GI bleeding: GI AVMs.  - Double balloon retro endoscopy at [facility], unable to identify bleeding lesion. He failed attempted IR embolization x 3 while at [facility]. 12 units PRBCs at [facility]. Received 15 uPRBC while at [facility]. IR consulted, but did not find any active bleeding on CTA 5/20. Repeat capsule study showed active bleeding in the mid ileum.Surgery was reconsulted and recommended transfer back to [facility] for possible surgery. - Now s/p laparoscopic surgical enteroscopy with 4 clips on 04/10/18 at [facility], c/b ischemic bowel/SBO. No evidence currently for overt bleeding.  - Hgb 8.5 today.  - Tolerating warfarin. Goal  INR 1.8-2.3, adjust to keep in this range. Denies bleeding. INR 1.65  - Getting Aranesp per nephrology.  - Continue octreotide  9. Squamous cell skin cancer: - Neck excision site without clear margins.  He saw Dr Lisbeth Renshaw with radiation oncology, radiation was recommended.  He wants to hold off for the time being, does not think he can handle radiation as HD wears him out 4 days/week.  - No change to current plan.   10. H/o TAVR: - Stable on last echo.  11. Septic Left Knee - s/p I&D and surgical wash out at [facility] up with ortho 06/02/18  RTC 2 weeks. Labs today.   Shirley Friar, PA-C  05/28/2018   Greater than 50% of the 45 minute visit was spent in counseling/coordination of care regarding disease state education, salt/fluid restriction, sliding scale diuretics, and medication compliance.

## 2018-05-28 NOTE — Progress Notes (Signed)
CSW met with patient in the clinic. Patient shared ups and downs emotionally with recent health issues and continued dialysis. Patient states "the scales of of my good and bad days are getting closer". Patient stated he sometimes thinks about the discontinuing dialysis and allowing nature to take it's course. Patient reports his off dialysis days are much better and he feels more positive. CSW provided supportive intervention and discussed goals of care. Patient appears to have good understanding of his options and at times tired of lengthy medical course. Patient might benefit from some LVAD peer to peer support and suggested he contact a member of the support group. Patient states he plans too and often gets calls from the group members. CSW continues to follow for supportive needs. Raquel Sarna, [place], [place]

## 2018-06-01 ENCOUNTER — Telehealth: Payer: Self-pay | Admitting: *Deleted

## 2018-06-01 NOTE — Telephone Encounter (Signed)
Trevor [name], PT, [facility] left a message asking for verbal orders for HHPT 1week7 to address strengthening, ambulation, transfer training, etc....  I reviewed the medical record and social work note.  HHPT is indicated.  Verbal orders given per office protocol

## 2018-06-02 ENCOUNTER — Telehealth (HOSPITAL_COMMUNITY): Payer: Self-pay | Admitting: *Deleted

## 2018-06-02 NOTE — Telephone Encounter (Signed)
Returned phone call to Mr. Trevor [name] regarding voicemail he left about having difficulty filling a prescription. Left a message for him to call the VAD clinic phone back to let me know what medication he needed filled.   Emerson Monte RN, BSN VAD Coordinator  24/7 Pager [phone]

## 2018-06-04 ENCOUNTER — Other Ambulatory Visit (HOSPITAL_COMMUNITY): Payer: Self-pay | Admitting: Unknown Physician Specialty

## 2018-06-04 MED ORDER — DOCUSATE SODIUM 250 MG PO CAPS: 250.0000 mg | ORAL_CAPSULE | Freq: Every day | ORAL | 0 refills | Status: DC

## 2018-06-05 ENCOUNTER — Other Ambulatory Visit (HOSPITAL_COMMUNITY): Payer: Self-pay | Admitting: Unknown Physician Specialty

## 2018-06-05 DIAGNOSIS — Z95811 Presence of heart assist device: Secondary | ICD-10-CM

## 2018-06-08 ENCOUNTER — Emergency Department (HOSPITAL_COMMUNITY): Payer: Medicare Other

## 2018-06-08 ENCOUNTER — Inpatient Hospital Stay (HOSPITAL_COMMUNITY)
Admission: EM | Admit: 2018-06-08 | Discharge: 2018-06-12 | DRG: 291 | Disposition: A | Discharge status: Home Health | Payer: Medicare Other | Attending: Internal Medicine | Admitting: Internal Medicine

## 2018-06-08 ENCOUNTER — Encounter (HOSPITAL_COMMUNITY): Payer: Self-pay

## 2018-06-08 ENCOUNTER — Other Ambulatory Visit: Payer: Self-pay

## 2018-06-08 DIAGNOSIS — D631 Anemia in chronic kidney disease: Secondary | ICD-10-CM | POA: Diagnosis present

## 2018-06-08 DIAGNOSIS — Z7951 Long term (current) use of inhaled steroids: Secondary | ICD-10-CM

## 2018-06-08 DIAGNOSIS — E876 Hypokalemia: Secondary | ICD-10-CM | POA: Diagnosis not present

## 2018-06-08 DIAGNOSIS — Z79899 Other long term (current) drug therapy: Secondary | ICD-10-CM | POA: Diagnosis not present

## 2018-06-08 DIAGNOSIS — Z85828 Personal history of other malignant neoplasm of skin: Secondary | ICD-10-CM

## 2018-06-08 DIAGNOSIS — I2510 Atherosclerotic heart disease of native coronary artery without angina pectoris: Secondary | ICD-10-CM | POA: Diagnosis present

## 2018-06-08 DIAGNOSIS — I5023 Acute on chronic systolic (congestive) heart failure: Secondary | ICD-10-CM | POA: Diagnosis present

## 2018-06-08 DIAGNOSIS — Z7901 Long term (current) use of anticoagulants: Secondary | ICD-10-CM

## 2018-06-08 DIAGNOSIS — Z955 Presence of coronary angioplasty implant and graft: Secondary | ICD-10-CM

## 2018-06-08 DIAGNOSIS — I480 Paroxysmal atrial fibrillation: Secondary | ICD-10-CM | POA: Diagnosis present

## 2018-06-08 DIAGNOSIS — J449 Chronic obstructive pulmonary disease, unspecified: Secondary | ICD-10-CM | POA: Diagnosis present

## 2018-06-08 DIAGNOSIS — E1122 Type 2 diabetes mellitus with diabetic chronic kidney disease: Secondary | ICD-10-CM | POA: Diagnosis present

## 2018-06-08 DIAGNOSIS — N186 End stage renal disease: Secondary | ICD-10-CM | POA: Diagnosis present

## 2018-06-08 DIAGNOSIS — Z794 Long term (current) use of insulin: Secondary | ICD-10-CM

## 2018-06-08 DIAGNOSIS — R627 Adult failure to thrive: Secondary | ICD-10-CM | POA: Diagnosis present

## 2018-06-08 DIAGNOSIS — Z952 Presence of prosthetic heart valve: Secondary | ICD-10-CM | POA: Diagnosis not present

## 2018-06-08 DIAGNOSIS — G4733 Obstructive sleep apnea (adult) (pediatric): Secondary | ICD-10-CM | POA: Diagnosis present

## 2018-06-08 DIAGNOSIS — D72829 Elevated white blood cell count, unspecified: Secondary | ICD-10-CM | POA: Diagnosis present

## 2018-06-08 DIAGNOSIS — I472 Ventricular tachycardia: Secondary | ICD-10-CM | POA: Diagnosis present

## 2018-06-08 DIAGNOSIS — I4821 Permanent atrial fibrillation: Secondary | ICD-10-CM | POA: Diagnosis present

## 2018-06-08 DIAGNOSIS — Z951 Presence of aortocoronary bypass graft: Secondary | ICD-10-CM | POA: Diagnosis not present

## 2018-06-08 DIAGNOSIS — I509 Heart failure, unspecified: Secondary | ICD-10-CM

## 2018-06-08 DIAGNOSIS — Z992 Dependence on renal dialysis: Secondary | ICD-10-CM

## 2018-06-08 DIAGNOSIS — Z95811 Presence of heart assist device: Secondary | ICD-10-CM

## 2018-06-08 DIAGNOSIS — R0602 Shortness of breath: Secondary | ICD-10-CM

## 2018-06-08 DIAGNOSIS — Z95810 Presence of automatic (implantable) cardiac defibrillator: Secondary | ICD-10-CM

## 2018-06-08 DIAGNOSIS — I255 Ischemic cardiomyopathy: Secondary | ICD-10-CM | POA: Diagnosis present

## 2018-06-08 LAB — BASIC METABOLIC PANEL
Anion gap: 12 (ref 5–15)
BUN: 9 mg/dL (ref 8–23)
CHLORIDE: 95 mmol/L — AB (ref 98–111)
CO2: 27 mmol/L (ref 22–32)
Calcium: 8.3 mg/dL — ABNORMAL LOW (ref 8.9–10.3)
Creatinine, Ser: 2.01 mg/dL — ABNORMAL HIGH (ref 0.61–1.24)
GFR, EST AFRICAN AMERICAN: 36 mL/min — AB (ref >60)
GFR, EST NON AFRICAN AMERICAN: 31 mL/min — AB (ref >60)
Glucose, Bld: 255 mg/dL — ABNORMAL HIGH (ref 70–99)
POTASSIUM: 3.6 mmol/L (ref 3.5–5.1)
SODIUM: 134 mmol/L — AB (ref 135–145)

## 2018-06-08 LAB — CBC
HEMATOCRIT: 29.9 % — AB (ref 39.0–52.0)
Hemoglobin: 8.9 g/dL — ABNORMAL LOW (ref 13.0–17.0)
MCH: 28.6 pg (ref 26.0–34.0)
MCHC: 29.8 g/dL — ABNORMAL LOW (ref 30.0–36.0)
MCV: 96.1 fL (ref 78.0–100.0)
PLATELETS: 222 10*3/uL (ref 150–400)
RBC: 3.11 MIL/uL — AB (ref 4.22–5.81)
RDW: 18.7 % — AB (ref 11.5–15.5)
WBC: 23.4 10*3/uL — AB (ref 4.0–10.5)

## 2018-06-08 LAB — GLUCOSE, CAPILLARY: Glucose-Capillary: 271 mg/dL — ABNORMAL HIGH (ref 70–99)

## 2018-06-08 LAB — PROCALCITONIN: PROCALCITONIN: 0.48 ng/mL

## 2018-06-08 LAB — I-STAT TROPONIN, ED: Troponin i, poc: 0.07 ng/mL (ref 0.00–0.08)

## 2018-06-08 LAB — PROTIME-INR
INR: 1.73
PROTHROMBIN TIME: 20.1 s — AB (ref 11.4–15.2)

## 2018-06-08 LAB — LACTATE DEHYDROGENASE: LDH: 251 U/L — ABNORMAL HIGH (ref 98–192)

## 2018-06-08 MED ORDER — WARFARIN - PHARMACIST DOSING INPATIENT
Freq: Every day | Status: DC
  Administered 2018-06-10: 1

## 2018-06-08 MED ORDER — ONDANSETRON HCL 4 MG/2ML IJ SOLN
4.0000 mg | Freq: Four times a day (QID) | INTRAMUSCULAR | Status: DC | PRN
  Administered 2018-06-09 – 2018-06-10 (×3): 4 mg via INTRAVENOUS
  Filled 2018-06-08 (×3): qty 2

## 2018-06-08 MED ORDER — SENNOSIDES-DOCUSATE SODIUM 8.6-50 MG PO TABS
1.0000 | ORAL_TABLET | Freq: Two times a day (BID) | ORAL | Status: DC
  Administered 2018-06-08 – 2018-06-12 (×8): 1 via ORAL
  Filled 2018-06-08 (×8): qty 1

## 2018-06-08 MED ORDER — RENA-VITE PO TABS
1.0000 | ORAL_TABLET | Freq: Every day | ORAL | Status: DC
  Administered 2018-06-08 – 2018-06-11 (×3): 1 via ORAL
  Filled 2018-06-08 (×4): qty 1

## 2018-06-08 MED ORDER — SODIUM CHLORIDE 0.9 % IV SOLN
2.0000 g | Freq: Once | INTRAVENOUS | Status: AC
  Administered 2018-06-08: 2 g via INTRAVENOUS
  Filled 2018-06-08: qty 2

## 2018-06-08 MED ORDER — FLUTICASONE PROPIONATE HFA 110 MCG/ACT IN AERO: 2.0000 | INHALATION_SPRAY | Freq: Two times a day (BID) | RESPIRATORY_TRACT | Status: DC

## 2018-06-08 MED ORDER — VANCOMYCIN HCL 10 G IV SOLR
1750.0000 mg | Freq: Once | INTRAVENOUS | Status: AC
  Administered 2018-06-08: 1750 mg via INTRAVENOUS
  Filled 2018-06-08: qty 1750

## 2018-06-08 MED ORDER — BUDESONIDE 0.25 MG/2ML IN SUSP
0.2500 mg | Freq: Two times a day (BID) | RESPIRATORY_TRACT | Status: DC
  Administered 2018-06-08 – 2018-06-12 (×8): 0.25 mg via RESPIRATORY_TRACT
  Filled 2018-06-08 (×8): qty 2

## 2018-06-08 MED ORDER — DOCUSATE SODIUM 100 MG PO CAPS
200.0000 mg | ORAL_CAPSULE | Freq: Every day | ORAL | Status: DC
  Administered 2018-06-09 – 2018-06-12 (×4): 200 mg via ORAL
  Filled 2018-06-08 (×4): qty 2

## 2018-06-08 MED ORDER — INSULIN ASPART 100 UNIT/ML ~~LOC~~ SOLN
0.0000 [IU] | Freq: Three times a day (TID) | SUBCUTANEOUS | Status: DC
  Administered 2018-06-09: 2 [IU] via SUBCUTANEOUS
  Administered 2018-06-09 – 2018-06-10 (×2): 3 [IU] via SUBCUTANEOUS
  Administered 2018-06-10: 5 [IU] via SUBCUTANEOUS
  Administered 2018-06-11: 2 [IU] via SUBCUTANEOUS
  Administered 2018-06-11 – 2018-06-12 (×2): 3 [IU] via SUBCUTANEOUS

## 2018-06-08 MED ORDER — WARFARIN SODIUM 5 MG PO TABS
5.0000 mg | ORAL_TABLET | Freq: Once | ORAL | Status: AC
  Administered 2018-06-08: 5 mg via ORAL
  Filled 2018-06-08: qty 1

## 2018-06-08 MED ORDER — LEVOTHYROXINE SODIUM 25 MCG PO TABS
25.0000 ug | ORAL_TABLET | Freq: Every day | ORAL | Status: DC
  Administered 2018-06-09 – 2018-06-12 (×4): 25 ug via ORAL
  Filled 2018-06-08 (×4): qty 1

## 2018-06-08 MED ORDER — SILDENAFIL CITRATE 20 MG PO TABS
20.0000 mg | ORAL_TABLET | Freq: Three times a day (TID) | ORAL | Status: DC
  Administered 2018-06-08 – 2018-06-12 (×10): 20 mg via ORAL
  Filled 2018-06-08 (×11): qty 1

## 2018-06-08 MED ORDER — VANCOMYCIN HCL IN DEXTROSE 750-5 MG/150ML-% IV SOLN
750.0000 mg | INTRAVENOUS | Status: DC
  Filled 2018-06-08 (×2): qty 150

## 2018-06-08 MED ORDER — SODIUM CHLORIDE 0.9 % IV SOLN
2.0000 g | INTRAVENOUS | Status: DC
  Administered 2018-06-10: 2 g via INTRAVENOUS
  Filled 2018-06-08: qty 2

## 2018-06-08 MED ORDER — CALCITRIOL 0.25 MCG PO CAPS: 0.5000 ug | ORAL_CAPSULE | ORAL | Status: DC

## 2018-06-08 MED ORDER — PANTOPRAZOLE SODIUM 40 MG PO TBEC
40.0000 mg | DELAYED_RELEASE_TABLET | Freq: Two times a day (BID) | ORAL | Status: DC
  Administered 2018-06-08 – 2018-06-12 (×7): 40 mg via ORAL
  Filled 2018-06-08 (×8): qty 1

## 2018-06-08 MED ORDER — ACETAMINOPHEN 325 MG PO TABS
650.0000 mg | ORAL_TABLET | ORAL | Status: DC | PRN
Start: 2018-06-08 — End: 2018-06-12
  Administered 2018-06-08 – 2018-06-09 (×2): 650 mg via ORAL
  Filled 2018-06-08 (×2): qty 2

## 2018-06-08 MED ORDER — TRAZODONE HCL 50 MG PO TABS
25.0000 mg | ORAL_TABLET | Freq: Every day | ORAL | Status: DC
  Administered 2018-06-08 – 2018-06-11 (×3): 25 mg via ORAL
  Filled 2018-06-08 (×3): qty 1

## 2018-06-08 MED ORDER — ACETAMINOPHEN 325 MG PO TABS: 650.0000 mg | ORAL_TABLET | ORAL | Status: DC | PRN

## 2018-06-08 MED ORDER — BENZONATATE 100 MG PO CAPS
100.0000 mg | ORAL_CAPSULE | Freq: Three times a day (TID) | ORAL | Status: DC | PRN
  Administered 2018-06-09 – 2018-06-10 (×4): 100 mg via ORAL
  Filled 2018-06-08 (×4): qty 1

## 2018-06-08 MED ORDER — ALBUTEROL SULFATE (2.5 MG/3ML) 0.083% IN NEBU: 2.5000 mg | INHALATION_SOLUTION | RESPIRATORY_TRACT | Status: DC | PRN

## 2018-06-08 MED ORDER — BUSPIRONE HCL 5 MG PO TABS
5.0000 mg | ORAL_TABLET | Freq: Two times a day (BID) | ORAL | Status: DC
  Administered 2018-06-08 – 2018-06-12 (×7): 5 mg via ORAL
  Filled 2018-06-08 (×8): qty 1

## 2018-06-08 MED ORDER — POLYETHYLENE GLYCOL 3350 17 G PO PACK: 17.0000 g | PACK | Freq: Every day | ORAL | Status: DC | PRN

## 2018-06-08 MED ORDER — ATORVASTATIN CALCIUM 40 MG PO TABS
40.0000 mg | ORAL_TABLET | Freq: Every day | ORAL | Status: DC
  Administered 2018-06-09 – 2018-06-11 (×3): 40 mg via ORAL
  Filled 2018-06-08 (×3): qty 1

## 2018-06-08 NOTE — Progress Notes (Signed)
ANTIBIOTIC CONSULT NOTE - INITIAL  Pharmacy Consult for Vancomycin and Cefepime Indication: bacteremia  No Known Allergies  Patient Measurements: Height: 5\' 5"  (165.1 cm) Weight: 169 lb (76.7 kg) IBW/kg (Calculated) : 61.5  Vital Signs: Temp: 98.2 F (36.8 C) (08/05 1632) Temp Source: Oral (08/05 1632) BP: 105/62 (08/05 1632) Pulse Rate: 103 (08/05 1632) Intake/Output from previous day: No intake/output data recorded. Intake/Output from this shift: No intake/output data recorded.  Labs: Recent Labs    06/08/18 1652  WBC 23.4*  HGB 8.9*  PLT 222  CREATININE 2.01*   Estimated Creatinine Clearance: 31.8 mL/min (A) (by C-G formula based on SCr of 2.01 mg/dL (H)). No results for input(s): VANCOTROUGH, VANCOPEAK, VANCORANDOM, GENTTROUGH, GENTPEAK, GENTRANDOM, TOBRATROUGH, TOBRAPEAK, TOBRARND, AMIKACINPEAK, AMIKACINTROU, AMIKACIN in the last 72 hours.   Microbiology: No results found for this or any previous visit (from the past 720 hour(s)).  Medical History: Past Medical History:  Diagnosis Date  . AICD (automatic cardioverter/defibrillator) present   . Asthma   . AVM (arteriovenous malformation) of colon 07/07/2017  . CHF (congestive heart failure) ([place])   . Diabetes ([place])   . ESRF (end stage renal failure) ([place]) 07/07/2017   pt receiving hemodialysis Monday- Wednesday-friday, sometimes Saturday  . Kidney disease   . LVAD (left ventricular assist device) present ([place]) 12/2016  . Permanent atrial fibrillation ([place]) 07/07/2017  . Presence of permanent cardiac pacemaker    AICD  . Sleep apnea    Assessment: 72 YO M with PMH of CAD s/p CABG x 4 2010, OSA, AS with TAVR 2015, DM2, LVAD, CHF, Afib, ESRD on HD MWF, GI bleed presenting with CP and SOB. Pharmacy consulted to dose vancomycin and cefepime.   WBC trending up to 23.4, afebrile. Blood cultures collected.  Goal of Therapy:  Vancomycin trough level 15-20 mcg/ml  Plan:  Vancomycin 1750mg  bolus Followed by  Vancomycin 750mg  every MWF in HD Cefepime 2g now Followed by Cefepime 2g every MWF after HD Follow up culture results  F/u LOT   Juanell Fairly, PharmD PGY1 Pharmacy Resident Phone [phone] 06/08/2018 8:21 PM

## 2018-06-08 NOTE — Progress Notes (Signed)
ANTICOAGULATION CONSULT NOTE - Initial Consult  Pharmacy Consult for Warfarin Indication: LVAD  No Known Allergies  Patient Measurements: Height: 5\' 5"  (165.1 cm) Weight: 169 lb (76.7 kg) IBW/kg (Calculated) : 61.5  Vital Signs: Temp: 98.2 F (36.8 C) (08/05 1632) Temp Source: Oral (08/05 1632) BP: 105/62 (08/05 1632) Pulse Rate: 103 (08/05 1632)  Labs: No results for input(s): HGB, HCT, PLT, APTT, LABPROT, INR, HEPARINUNFRC, HEPRLOWMOCWT, CREATININE, CKTOTAL, CKMB, TROPONINI in the last 72 hours.  Estimated Creatinine Clearance: 24.5 mL/min (A) (by C-G formula based on SCr of 2.61 mg/dL (H)).   Medical History: Past Medical History:  Diagnosis Date  . AICD (automatic cardioverter/defibrillator) present   . Asthma   . AVM (arteriovenous malformation) of colon 07/07/2017  . CHF (congestive heart failure) ([place])   . Diabetes ([place])   . ESRF (end stage renal failure) ([place]) 07/07/2017   pt receiving hemodialysis Monday- Wednesday-friday, sometimes Saturday  . Kidney disease   . LVAD (left ventricular assist device) present ([place]) 12/2016  . Permanent atrial fibrillation ([place]) 07/07/2017  . Presence of permanent cardiac pacemaker    AICD  . Sleep apnea     Assessment: 72 YO M with PMH of CAD s/p CABG x 4 2010, OSA, AS with TAVR 2015, DM2, LVAD, CHF, Afib, ESRD on HD, GI bleed presenting with CP and SOB.   Warfarin PTA dose 5mg  SMFSa, 2.5mg  TWTh. INR today is slightly subtherapeutic at 1.73. No reports of bleeding.   Goal of Therapy:  INR 1.8-2.3 Monitor platelets by anticoagulation protocol: Yes   Plan:  Warfarin 5mg  tonight. Will keep with home dose as INR is only slightly subtherapeutic and pt is critically ill. Monitor INR in AM - if subtherapeutic consider higher dose tomorrow Monitor s/sx bleeding, daily INRs and CBC  Juanell Fairly, PharmD PGY1 Pharmacy Resident Phone [phone] 06/08/2018 5:47 PM

## 2018-06-08 NOTE — ED Notes (Signed)
Called 2C to give report.  Receiving RN unable to take report at this time.

## 2018-06-08 NOTE — ED Provider Notes (Signed)
[facility] EMERGENCY DEPARTMENT Provider Note   CSN: [id number] Arrival date & time: 06/08/18  1621     History   Chief Complaint Chief Complaint  Patient presents with  . Chest Pain  . Shortness of Breath    HPI Trevor [name] is a 72 y.o. male.  72yo M w/ extensive PMH inlcuding ESRD on HD, LVAD, OSA, T2DM, COPD who p/w shortness of breath.  The patient states that he had his usual dialysis session today and felt okay during the session but afterwards began having shortness of breath.  He notes that he did have some shortness of breath last night that seemed to improve after breathing treatment.  He gave himself 2 breathing treatments prior to arrival today.  He endorses PND and orthopnea. He has chronic LE edema. He has a chronic cough. No fevers, sore throat, vomiting, diarrhea, or sick contacts. He is compliant with medications.   The history is provided by the patient and the spouse.  Chest Pain   Associated symptoms include shortness of breath.  Shortness of Breath  Associated symptoms include chest pain.    Past Medical History:  Diagnosis Date  . AICD (automatic cardioverter/defibrillator) present   . Asthma   . AVM (arteriovenous malformation) of colon 07/07/2017  . CHF (congestive heart failure) ([place])   . Diabetes ([place])   . ESRF (end stage renal failure) ([place]) 07/07/2017   pt receiving hemodialysis Monday- Wednesday-friday, sometimes Saturday  . Kidney disease   . LVAD (left ventricular assist device) present ([place]) 12/2016  . Permanent atrial fibrillation ([place]) 07/07/2017  . Presence of permanent cardiac pacemaker    AICD  . Sleep apnea     Patient Active Problem List   Diagnosis Date Noted  . Subtherapeutic international normalized ratio (INR)   . Arterial hypotension   . Leukocytosis   . Acute blood loss anemia   . History of GI bleed   . Physical debility 05/05/2018  . Debility   . Diabetes mellitus type 2 in nonobese (HCC)   . ESRD  on dialysis ([place])   . Pyogenic arthritis of left knee joint ([place])   . Sleep disturbance   . Generalized anxiety disorder   . Ileus ([place])   . Arteriovenous malformation   . GI bleed 03/09/2018  . Small bowel bleed not requiring more than 4 units of blood in 24 hours, ICU, or surgery   . Heme positive stool   . Cellulitis 12/03/2017  . 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 Chronic systolic CHF (congestive heart failure) ([place]) 09/08/2017  . ESRD (end stage renal disease) ([place]) 07/07/2017  . AVM (arteriovenous malformation) of colon 07/07/2017  . Thrombocytopenia ([place]) 07/07/2017  . Permanent atrial fibrillation ([place]) 07/07/2017  . Anemia of chronic disease 07/07/2017  . Acute on chronic systolic (congestive) heart failure ([place]) 03/27/2017  . AVM (arteriovenous malformation) of colon with hemorrhage   . Symptomatic anemia 02/10/2017  . Type 2 diabetes mellitus ([place]) 01/17/2017  . COPD (chronic obstructive pulmonary disease) ([place]) 01/17/2017  . LVAD (left ventricular assist device) present ([place]) 01/09/2017  . Presence of left ventricular assist device (LVAD) ([place])   . Palliative care encounter   . Cardiorenal syndrome with renal failure 11/28/2016  . Cardiomyopathy, ischemic 11/27/2016  . Coronary artery disease 11/27/2016  . Status post aortic valve replacement 11/27/2016  . Asthma, chronic 11/27/2016  . OSA (obstructive sleep apnea) 11/27/2016    Past Surgical History:  Procedure Laterality Date  . AORTIC VALVE REPLACEMENT  2015   [place]  . APPLICATION OF A-CELL OF EXTREMITY Left 11/20/2017   Procedure: APPLICATION OF A-CELL;  Surgeon: Wallace Going, DO;  Location: [place] [facility];  Service: Plastics;  Laterality: Left;  . AV FISTULA PLACEMENT Left 06/30/2017   Procedure: CREATION of LEFT ARM Brachiocephalic Fistula;  Surgeon: Conrad Graysville, MD;  Location: [place] [facility];  Service: Vascular;  Laterality: Left;  . CARDIAC CATHETERIZATION N/A 12/02/2016   Procedure: Right Heart  Cath;  Surgeon: Larey Dresser, MD;  Location: [facility] CV LAB;  Service: Cardiovascular;  Laterality: N/A;  . COLONOSCOPY N/A 02/14/2017   Procedure: COLONOSCOPY;  Surgeon: Milus Banister, MD;  Location: [facility];  Service: Endoscopy;  Laterality: N/A;  . COLONOSCOPY WITH PROPOFOL N/A 02/27/2018   Procedure: COLONOSCOPY WITH PROPOFOL;  Surgeon: Jerene Bears, MD;  Location: [facility];  Service: Gastroenterology;  Laterality: N/A;  . CORONARY ANGIOPLASTY WITH STENT PLACEMENT  2013   in [place]  . CORONARY ARTERY BYPASS GRAFT  2010   in [place]  . DIALYSIS/PERMA CATHETER INSERTION  06/30/2017   Procedure: INSERTION Dialysis Catheter;  Surgeon: Conrad Redlands, MD;  Location: [facility] OR;  Service: Vascular;;  . ESOPHAGOGASTRODUODENOSCOPY (EGD) WITH PROPOFOL N/A 02/27/2018   Procedure: ESOPHAGOGASTRODUODENOSCOPY (EGD) WITH PROPOFOL;  Surgeon: Jerene Bears, MD;  Location: [facility] ENDOSCOPY;  Service: Gastroenterology;  Laterality: N/A;  . GIVENS CAPSULE STUDY N/A 02/27/2018   Procedure: GIVENS CAPSULE STUDY;  Surgeon: Jerene Bears, MD;  Location: [place] [facility];  Service: Gastroenterology;  Laterality: N/A;  . GIVENS CAPSULE STUDY N/A 03/25/2018   Procedure: GIVENS CAPSULE STUDY;  Surgeon: Jackquline Denmark, MD;  Location[facility] ENDOSCOPY;  Service: Endoscopy;  Laterality: N/A;  . IABP INSERTION N/A 12/11/2016   Procedure: IABP Insertion;  Surgeon: Larey Dresser, MD;  Location: [place] CV LAB;  Service: Cardiovascular;  Laterality: N/A;  . INSERTION OF IMPLANTABLE LEFT VENTRICULAR ASSIST DEVICE N/A 12/13/2016   Procedure: INSERTION OF IMPLANTABLE LEFT VENTRICULAR ASSIST DEVICE;  Surgeon: Ivin Poot, MD;  Location: [place];  Service: Open Heart Surgery;  Laterality: N/A;  HEARTMATE II  NITRIC OXIDE  . IR FLUORO GUIDE CV LINE LEFT  06/17/2017  . IR US GUIDE VASC ACCESS LEFT  06/17/2017  . MASS EXCISION Left 11/20/2017   Procedure: EXCISION OF LEFT NECK AND RIGHT LEG SKIN CANCER WITH A CELL PLACEMENT;   Surgeon: Wallace Going, DO;  Location: [place];  Service: Plastics;  Laterality: Left;  Marland Kitchen MASS EXCISION Left 11/26/2017   Procedure: EXPLORATION AND EXCISION OF LEFT NECK;  Surgeon: Wallace Going, DO;  Location: [place] [facility];  Service: Plastics;  Laterality: Left;  . RIGHT HEART CATH N/A 12/11/2016   Procedure: Right Heart Cath;  Surgeon: Larey Dresser, MD;  Location: [place] CV LAB;  Service: Cardiovascular;  Laterality: N/A;  . RIGHT HEART CATH N/A 03/27/2017   Procedure: Right Heart Cath;  Surgeon: Larey Dresser, MD;  Location: [facility] CV LAB;  Service: Cardiovascular;  Laterality: N/A;  . RIGHT HEART CATH N/A 06/13/2017   Procedure: RIGHT HEART CATH;  Surgeon: Jolaine Artist, MD;  Location: [facility] CV LAB;  Service: Cardiovascular;  Laterality: N/A;  . TEE WITHOUT CARDIOVERSION N/A 12/13/2016   Procedure: TRANSESOPHAGEAL ECHOCARDIOGRAM (TEE);  Surgeon: Ivin Poot, MD;  Location: [place];  Service: Open Heart Surgery;  Laterality: N/A;  . TRICUSPID VALVE REPLACEMENT N/A 12/13/2016   Procedure: TRICUSPID VALVE  REPAIR WITH EDWARDS MC 3 TRICUSPID ANNULOPLASTY RING MODEL 4900 SIZE T 28;  Surgeon: Ivin Poot, MD;  Location: [place];  Service: Open Heart Surgery;  Laterality: N/A;        Home Medications    Prior to Admission medications   Medication Sig Start Date End Date Taking? Authorizing Provider  acetaminophen (TYLENOL) 325 MG tablet Take 2 tablets (650 mg total) by mouth every 4 (four) hours as needed for headache or mild pain. 03/10/18   Shirley Friar, PA-C  albuterol (PROVENTIL HFA;VENTOLIN HFA) 108 (90 Base) MCG/ACT inhaler Inhale 2 puffs into the lungs every 4 (four) hours as needed for wheezing or shortness of breath. 02/10/18   Larey Dresser, MD  atorvastatin (LIPITOR) 40 MG tablet TAKE 1 TABLET(40 MG) BY MOUTH DAILY Patient taking differently: Take 40 mg by mouth daily at 6 PM.  09/02/17   Larey Dresser, MD  benzonatate (TESSALON) 100 MG  capsule Take 1 capsule (100 mg total) by mouth 3 (three) times daily as needed for cough. 05/05/18   Shirley Friar, PA-C  busPIRone (BUSPAR) 5 MG tablet Take 1 tablet (5 mg total) by mouth 2 (two) times daily. 01/22/18   Bensimhon, Shaune Pascal, MD  calcitRIOL (ROCALTROL) 0.5 MCG capsule Take 1 capsule (0.5 mcg total) by mouth every Monday, Wednesday, and Friday with hemodialysis. 03/11/18   Shirley Friar, PA-C  Darbepoetin Alfa (ARANESP) 200 MCG/0.4ML SOSY injection Inject 0.4 mLs (200 mcg total) into the vein every Monday with hemodialysis. 04/06/18   Clegg, Amy D, NP  docusate sodium (COLACE) 250 MG capsule Take 1 capsule (250 mg total) by mouth daily. 06/04/18   Larey Dresser, MD  fluticasone (FLOVENT HFA) 110 MCG/ACT inhaler Inhale 2 puffs into the lungs 2 (two) times daily. 02/10/18   Larey Dresser, MD  hydrocerin (EUCERIN) CREA Apply 1 application topically 2 (two) times daily. 05/19/18   Shirley Friar, PA-C  insulin glargine (LANTUS) 100 UNIT/ML injection Use 4 units in morning and 6 units at bedtime. 05/19/18   Love, Ivan Anchors, PA-C  levothyroxine (SYNTHROID, LEVOTHROID) 25 MCG tablet Take 1 tablet (25 mcg total) by mouth daily before breakfast. 10/02/17   Larey Dresser, MD  multivitamin (RENA-VIT) TABS tablet Take 1 tablet by mouth at bedtime. 05/19/18   Shirley Friar, PA-C  pantoprazole (PROTONIX) 40 MG tablet Take 1 tablet (40 mg total) by mouth 2 (two) times daily. 05/19/18   Love, Ivan Anchors, PA-C  polyethylene glycol (MIRALAX / GLYCOLAX) packet Take 17 g by mouth daily as needed for mild constipation. 05/19/18   Shirley Friar, PA-C  senna-docusate (SENOKOT-S) 8.6-50 MG tablet Take 1 tablet by mouth 2 (two) times daily. 05/05/18   Shirley Friar, PA-C  sildenafil (REVATIO) 20 MG tablet Take 1 tablet (20 mg total) by mouth 3 (three) times daily. 05/19/18   Love, Ivan Anchors, PA-C  traZODone (DESYREL) 50 MG tablet Take 0.5 tablets (25 mg total) by  mouth at bedtime. 05/19/18   Shirley Friar, PA-C  warfarin (COUMADIN) 2.5 MG tablet Take 5 mg (2 tabs) M/W/F and 2.5 mg (1 tab) Tue/Thurs/Sat/Sunday Patient taking differently: Take 2.5-5 mg by mouth See admin instructions. Take 5 mg on M/W/F and 2.5 mg on Tue/Thurs/Sat/Sunday 05/19/18   Shirley Friar, PA-C    Family History Family History  Problem Relation Age of Onset  . Heart failure Father   . Heart attack Father   . Healthy Mother   .  Diabetes Paternal Grandfather     Social History Social History   Tobacco Use  . Smoking status: Never Smoker  . Smokeless tobacco: Never Used  Substance Use Topics  . Alcohol use: No  . Drug use: No     Allergies   Patient has no known allergies.   Review of Systems Review of Systems  Respiratory: Positive for shortness of breath.   Cardiovascular: Positive for chest pain.   All other systems reviewed and are negative except that which was mentioned in HPI   Physical Exam Updated Vital Signs BP 105/62 (BP Location: Right Arm)   Pulse (!) 103   Temp 98.2 F (36.8 C) (Oral)   Resp 18   Ht 5\' 5"  (1.651 m)   Wt 76.7 kg (169 lb)   SpO2 96%   BMI 28.12 kg/m   Physical Exam  Constitutional: He is oriented to person, place, and time. He appears well-developed and well-nourished. No distress.  HENT:  Head: Normocephalic and atraumatic.  Moist mucous membranes  Eyes: Conjunctivae are normal.  Neck: Neck supple.  Cardiovascular: Normal heart sounds.  Continuous hum of LVAD  Pulmonary/Chest:  Mild dyspnea without respiratory distress, crackles w/ diminished breath sounds b/l, occasional end exp wheeze in R lung  Abdominal: Soft. Bowel sounds are normal. He exhibits no distension. There is no tenderness.  Musculoskeletal:       Right lower leg: He exhibits edema.       Left lower leg: He exhibits edema.  2+ pitting edema BLE  Neurological: He is alert and oriented to person, place, and time.  Fluent speech    Skin: Skin is warm and dry.  Linear scar over L knee with central area of dehiscence, mild drainage, no surrounding erythema or warmth  Psychiatric: He has a normal mood and affect. Judgment normal.  Nursing note and vitals reviewed.    ED Treatments / Results  Labs (all labs ordered are listed, but only abnormal results are displayed) Labs Reviewed  BASIC METABOLIC PANEL  CBC  PROTIME-INR  LACTATE DEHYDROGENASE  I-STAT TROPONIN, ED    EKG EKG Interpretation  Date/Time:  Monday June 08 2018 16:30:15 EDT Ventricular Rate:  115 PR Interval:    QRS Duration: 118 QT Interval:  512 QTC Calculation: 708 R Axis:   -137 Text Interpretation:  ** Critical Test Result: Long QTc Undetermined rhythm Lateral infarct , age undetermined Inferior-posterior infarct , age undetermined Abnormal ECG Confirmed by Theotis Burrow [phone]) on 06/08/2018 5:27:05 PM   Radiology Dg Chest 2 View  Result Date: 06/08/2018 CLINICAL DATA:  Short of breath and chest pain since this morning EXAM: CHEST - 2 VIEW COMPARISON:  05/25/2018 FINDINGS: Stable right subclavian AICD and aortic valve replacement hardware. Stable LVAD device. Normal heart size. Lungs under aerated with bibasilar atelectasis. Pulmonary vascularity is within normal limits. IMPRESSION: Bibasilar atelectasis. Electronically Signed   By: Marybelle Killings M.D.   On: 06/08/2018 17:28    Procedures Procedures (including critical care time)  Medications Ordered in ED Medications - No data to display   Initial Impression / Assessment and Plan / ED Course  I have reviewed the triage vital signs and the nursing notes.  Pertinent labs & imaging results that were available during my care of the patient were reviewed by me and considered in my medical decision making (see chart for details).    Pt nontoxic on exam, no respiratory distress. Had full dialysis session today. LVAD team contacted. Initial trop negative. CXR  w/ atalectasis, no overt  pulmonary edema but I suspect mild fluid overload based on physical exam and symptoms. [name] came to ED to evaluate, will admit for further treatment.  Final Clinical Impressions(s) / ED Diagnoses   Final diagnoses:  Acute on chronic heart failure, unspecified heart failure type [facility])  Shortness of breath    ED Discharge Orders    None       Little, Wenda Overland, MD 06/08/18 1754

## 2018-06-08 NOTE — H&P (Addendum)
VAD Team H&P    Trevor [name] is a 72 y.o. male with a history of CAD s/p CABG x 4 2010, OSA, AS with TAVR 2015, CKD, DM2, paroxysmal atrial fibrillation, asthma, and ischemic cardiomyopathy with Medtronic ICD.   Underwent HMII LVAD placement + tricuspid valve repair on 12/14/2015. Course was complicated by RV dysfunction and renal dysfunction.  Patient was admitted with lower GI bleeding in 4/18.  He had been on ASA 325 (increased with mild LDH elevation) and warfarin INR 2-2.5.  He had 4 units PRBCs total.  Colonoscopy showed colonic AVM treated with APC and clipping.  He was sent home off ASA and on warfarin INR goal 2-2.5.   In 5/18, creatinine was noted to increase as high as 3.67. RHC showing elevated right and left heart filling pressures and preserved cardiac output. He continued to have problems with up and down renal function, requiring up and down adjustment of torsemide.  He finally was admitted to the hospital in 8/18 with intractable volume overload.  Efforts at diuresis were complicated by AKI.  Speed was increased to 9600 rpm in the hospital. He finally had to undergo CVVH, then was started on intermittent HD.  He was discharged home to continue on HD.  He was admitted in 10/18 for inpatient HD due to excessive weight gain.  HD increased to 4 times/week.    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  He had a squamous cell skin cancer removed from the right leg, margins were clear.  This admission complicated by hematoma formation at neck site requiring evacuation.  His right leg site appeared to be infected and he had a course of vancomycin.   Pt had a prolonged admission 03/2018 to 05/2018 for GI bleeding. Transferred to [facility] for surgical treatment. 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     Seen in ED 05/25/18 with acute SOB after HD. Hgb was stable. CXR unremarkable. Noted to have WBC of 11.3. He was given nebulizer treatment with resolution of symptoms, and sent home.  Presents to ER today for recurrent severe weakness, nausea and SOB after completing HD today. Says he has been feeling weak lately but not acutely ill. Struggles with ADLs. Went to HD today and completed full treatment. Upon getting home developed nausea and severe SOB and wheezing. Felt terrible. Denies fevers or chills. No recent bleeding. Says he is not sure how much longer he can tolerate HD.   Labs pending.   CXR: no edema (Personally reviewed)  Wt:174>166>160>154>169>161 lbs(hospital weights from CIR)  Last clinic weight in April- 192.6lb Clinic weight 05/28/18 - 168.6lb             Weight today 169 pounds    VAD interrogation  Speed 9200 Flow 5.8 Power 5.9 PI 5.2  One No external power alarm on 8/5 (disconnected batteries at same time)    Review of Systems: [y] = yes, [ ]  = no    General: Weight gain [ ; Weight loss [ ] ; Anorexia [name] ]; Fatigue [y]; Fever [ ] ; Chills [ ] ; Weakness [name] ]   Cardiac: Chest pain/pressure [ ] ; Resting SOB [ y]; Exertional SOB [y]; Orthopnea [ ] ; Pedal Edema [y]; Palpitations [ ] ; Syncope [ ] ; Presyncope [ ] ;  Paroxysmal nocturnal dyspnea[ ]    Pulmonary: Cough [name] ]; [facility] ]; Hemoptysis[ ] ; Sputum [ ] ; Snoring [ ]    GI: Vomiting[y ]; Dysphagia[ ] ; Melena[ ] ; Hematochezia [ ] ; Heartburn[ ] ; Abdominal pain [ ] ; Constipation [ ] ; Diarrhea [ ] ; BRBPR [ ]    GU: Hematuria[ ] ; Dysuria [ ] ; Nocturia[ ]   Vascular: Pain in legs with walking [ ] ; Pain in feet with lying flat [ ] ; Non-healing sores [ ] ; Stroke [ ] ; TIA [ ] ; Slurred speech [ ] ;   Neuro: Headaches[ ] ; Vertigo[ ] ; Seizures[ ] ; Paresthesias[ ] ;Blurred vision [ ] ; Diplopia [ ] ; Vision changes [ ]    Ortho/Skin: Arthritis [y]; Joint pain [y]; Muscle pain [ ] ; Joint swelling [ ] ;  Back Pain [ ] ; Rash [ ]    Psych: Depression[ y]; Anxiety[y ]   Heme: Bleeding problems [ ] ; Clotting disorders [ ] ; Anemia [ ]    Endocrine: Diabetes [ y]; Thyroid dysfunction[ ]  '  PMH: 1. CAD: s/p CABG in 2010.  2. Aortic stenosis: s/p TAVR in 2015. Valve looked ok on 2/18 echo.  3. Atrial fibrillation: Chronic.  4. Type II diabetes.  5. ESRD  6. Chronic systolic CHF: Ischemic cardiomyopathy with prominent RV dysfunction.  Medtronic ICD.  - Echo (2/18): EF 20-25%, moderately dilated LV, moderately dilated/moderately dysfunctional RV, severe TR.  - Cardiogenic shock 2/18 requiring milrinone, norepinephrine, and IABP.  - Heartmate II LVAD for DT placed 12/13/16.   - RHC (5/18): mean RA 16, PA 55/22 mean 33, mean PCWP 23, CI 2.59, PVR 3.87 - RHC (8/18): mean RA 17, PA 56/25 mean 37, mean PCWP 20, CI 3.2 Fick/2.1 thermo 7. OSA.  8. Thrombocytopenia: Post-op LVAD, resolved.  9. Tricuspid regurgitation: Severe, s/p repair with LVAD placement in 2/18.  10. Lower GI bleed (4/18): Colonic AVM, treated with APC and clipping.  11. Psoriasis 12. Squamous cell skin cancer: left neck, right leg => removed 1/19.  Margins not clear at neck. Radiation recommended but so far he wants to hold off.   Social History   Socioeconomic History  . Marital status: Widowed    Spouse name: Not on file  . Number of children: 0  . Years of education: 19  . Highest education level: Not on file  Occupational History  . Not on file  Social Needs  . Financial resource strain: Not on file  . Food insecurity:    Worry: Not on file    Inability: Not on file  . Transportation needs:    Medical: Not on file    Non-medical: Not on file  Tobacco Use  . Smoking status: Never Smoker  . Smokeless tobacco: Never Used  Substance and Sexual Activity  . Alcohol use: No  . Drug use: No  . Sexual activity: Not Currently  Lifestyle  . Physical activity:    Days per week: Not on file    Minutes per session: Not  on file  . Stress: Not on file  Relationships  . Social connections:    Talks on phone: Not on file    Gets together: Not on file    Attends religious service: Not on file    Active member of club or organization: Not on file    Attends meetings of clubs or organizations: Not on file    Relationship status: Not on file  . Intimate partner violence:    Fear of current or ex partner: Not on file    Emotionally abused: Not on file  Physically abused: Not on file    Forced sexual activity: Not on file  Other Topics Concern  . Not on file  Social History Narrative   Patient is a widow. Moved to [place] from [place], [place]. Currently lives with his sister Kano Heckmann.    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    Family History  Problem Relation Age of Onset  . Heart failure Father   . Heart attack Father   . Healthy Mother   . Diabetes Paternal Grandfather    Review of systems complete and found to be negative unless listed in HPI.    No current facility-administered medications for this encounter.    Current Outpatient Medications  Medication Sig Dispense Refill  . acetaminophen (TYLENOL) 325 MG tablet Take 2 tablets (650 mg total) by mouth every 4 (four) hours as needed for headache or mild pain.    Marland Kitchen albuterol (PROVENTIL HFA;VENTOLIN HFA) 108 (90 Base) MCG/ACT inhaler Inhale 2 puffs into the lungs every 4 (four) hours as needed for wheezing or shortness of breath. 3 Inhaler 5  . atorvastatin (LIPITOR) 40 MG tablet TAKE 1 TABLET(40 MG) BY MOUTH DAILY (Patient taking differently: Take 40 mg by mouth daily at 6 PM. ) 30 tablet 11  . benzonatate (TESSALON) 100 MG capsule Take 1 capsule (100 mg total) by mouth 3 (three) times daily as needed for cough. 20 capsule 0  . busPIRone (BUSPAR) 5 MG tablet Take 1 tablet (5 mg total) by mouth 2 (two) times daily. 180 tablet 3  . calcitRIOL (ROCALTROL) 0.5 MCG capsule Take 1 capsule (0.5 mcg total) by mouth every Monday,  Wednesday, and Friday with hemodialysis.    . Darbepoetin Alfa (ARANESP) 200 MCG/0.4ML SOSY injection Inject 0.4 mLs (200 mcg total) into the vein every Monday with hemodialysis. 1.68 mL   . docusate sodium (COLACE) 250 MG capsule Take 1 capsule (250 mg total) by mouth daily. 10 capsule 0  . fluticasone (FLOVENT HFA) 110 MCG/ACT inhaler Inhale 2 puffs into the lungs 2 (two) times daily. 3 Inhaler 5  . hydrocerin (EUCERIN) CREA Apply 1 application topically 2 (two) times daily. 113 g 0  . insulin glargine (LANTUS) 100 UNIT/ML injection Use 4 units in morning and 6 units at bedtime. 10 mL 11  . levothyroxine (SYNTHROID, LEVOTHROID) 25 MCG tablet Take 1 tablet (25 mcg total) by mouth daily before breakfast. 90 tablet 3  . multivitamin (RENA-VIT) TABS tablet Take 1 tablet by mouth at bedtime. 30 tablet 6  . pantoprazole (PROTONIX) 40 MG tablet Take 1 tablet (40 mg total) by mouth 2 (two) times daily. 30 tablet 5  . polyethylene glycol (MIRALAX / GLYCOLAX) packet Take 17 g by mouth daily as needed for mild constipation. 14 each 0  . senna-docusate (SENOKOT-S) 8.6-50 MG tablet Take 1 tablet by mouth 2 (two) times daily.    . sildenafil (REVATIO) 20 MG tablet Take 1 tablet (20 mg total) by mouth 3 (three) times daily. 90 tablet 6  . traZODone (DESYREL) 50 MG tablet Take 0.5 tablets (25 mg total) by mouth at bedtime. 15 tablet 6  . warfarin (COUMADIN) 2.5 MG tablet Take 5 mg (2 tabs) M/W/F and 2.5 mg (1 tab) Tue/Thurs/Sat/Sunday (Patient taking differently: Take 2.5-5 mg by mouth See admin instructions. Take 5 mg on M/W/F and 2.5 mg on Tue/Thurs/Sat/Sunday) 50 tablet 6   Vitals:   06/08/18 1632 06/08/18 1633  BP: 105/62   Pulse: (!) 103   Resp: 18  Temp: 98.2 F (36.8 C)   TempSrc: Oral   SpO2: 96%   Weight:  76.7 kg (169 lb)  Height:  5\' 5"  (1.651 m)    [name]  Exam: Pale and ill appearing. Weak. NAD General:  NAD.  HEENT: normal  Neck: supple. JVP to ear + prominent CV waves Carotids 2+  bilat; no bruits. No lymphadenopathy or thryomegaly appreciated. Cor: LVAD hum.  Lungs: Clear. Abdomen: soft, nontender, non-distended. No hepatosplenomegaly. No bruits or masses. Good bowel sounds. Driveline site clean. Anchor in place.  Extremities: no cyanosis, clubbing, rash. 2+ edema  R knee site healing well  Neuro: alert & oriented x 3. No focal deficits. Moves all 4 without problem    Assessment/Plan: 1. Acute on Chronic systolic CHF: - Ischemic cardiomyopathy, EF 20-25% with RV dysfunction on 2/18 echo pre-LVAD.  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  Medtronic ICD.  He has had problems with RV failure post-op.  - He cotiinues to deteriorate particularly in setting of HD intolerance. Now NYHA IV.  - On exam appears volume overload despite HD and weight up about 8 pounds since recent discharge but stable over last week. CXR clear  - Will admit for possible additional HD in-house - He states he does not think he can tolerate HD much longer and if not PD candidate may need to involve Hospice team to discuss Vickery.   2. ESRD:  - Very poor tolerance of HD, and seems to be getting worse. He states he does not think he can tolerate HD much longer and if not PD candidate may need to re-engage Hospice team to discuss Reserve.   3. VAD: - VAD interrogated personally. Parameters stable. - One "No External Power" alarm on VAD in setting of unplugging both batteries at same time. We discussed this.  - Continue warfarin with INR goal 1.8 - 2.3. INR pending. Consult PharmD for warfarin management.  - Follow hgb with recent GIB - MAPs stable 70-80  4. CAD: s/p CABG - No s/s of ischemia.    - He is on atorvastatin. Off ASA with GI bleed.    5.  Atrial fibrillation: Chronic.   - On warfarin. INR pending .   6. RV failure:  - Continue Revatio at 40 mg tid.    7. 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  - Hgb 8.5. Per pt, renal goal is Hgb > 11.0.    - Recehck today. Continue Aranesp   8. GI bleeding: GI AVMs.  - Double balloon retro endoscopy at [facility], unable to identify bleeding lesion. He failed attempted IR embolization x 3 while at [facility]. 12 units PRBCs at [facility]. Received 15 uPRBC while at [facility]. IR consulted, but did not find any active bleeding on CTA 5/20. Repeat capsule study showed active bleeding in the mid ileum.Surgery was reconsulted and recommended transfer back to [facility] for possible surgery. - Now s/p laparoscopic surgical enteroscopy with 4 clips on 04/10/18 at [facility], c/b ischemic bowel/SBO. No evidence currently for overt bleeding.  - Tolerating warfarin. Goal INR 1.8-2.3, adjust to keep in this range. Await labs. PharmD managing  - Getting Aranesp per nephrology.  - Continue octreotide   9. Squamous cell skin cancer: - Neck excision site without clear margins.  He saw Dr Lisbeth Renshaw with radiation oncology, radiation was recommended.  He wants to hold off for the time being, does not think he can handle radiation as HD wears him out  4 days/week.  - No change to current plan.    10. H/o TAVR: - Stable on last echo.   11. H/o eptic Left Knee - s/p I&D and surgical wash out at [facility] - Stable. Had f/u with ortho 06/02/18. Site looks ok today   Glori Bickers, MD  06/08/2018   Addendum:  WBC 23K today. Will get bcx and cover with vancomycin/cefimpime empirically.   Glori Bickers, MD  6:27 PM

## 2018-06-08 NOTE — ED Triage Notes (Signed)
Pt endorses CP/shob that began after having full treatment of dialysis this morning. Pt has LVAD.

## 2018-06-09 ENCOUNTER — Inpatient Hospital Stay (HOSPITAL_COMMUNITY): Admission: RE | Admit: 2018-06-09 | Payer: Medicare Other | Source: Ambulatory Visit

## 2018-06-09 ENCOUNTER — Other Ambulatory Visit (HOSPITAL_COMMUNITY): Payer: Medicare Other

## 2018-06-09 LAB — BASIC METABOLIC PANEL
ANION GAP: 10 (ref 5–15)
BUN: 15 mg/dL (ref 8–23)
CALCIUM: 7.9 mg/dL — AB (ref 8.9–10.3)
CO2: 28 mmol/L (ref 22–32)
CREATININE: 2.36 mg/dL — AB (ref 0.61–1.24)
Chloride: 95 mmol/L — ABNORMAL LOW (ref 98–111)
GFR, EST AFRICAN AMERICAN: 30 mL/min — AB (ref >60)
GFR, EST NON AFRICAN AMERICAN: 26 mL/min — AB (ref >60)
GLUCOSE: 218 mg/dL — AB (ref 70–99)
Potassium: 3.2 mmol/L — ABNORMAL LOW (ref 3.5–5.1)
Sodium: 133 mmol/L — ABNORMAL LOW (ref 135–145)

## 2018-06-09 LAB — GLUCOSE, CAPILLARY
GLUCOSE-CAPILLARY: 62 mg/dL — AB (ref 70–99)
GLUCOSE-CAPILLARY: 125 mg/dL — AB (ref 70–99)
GLUCOSE-CAPILLARY: 144 mg/dL — AB (ref 70–99)
GLUCOSE-CAPILLARY: 214 mg/dL — AB (ref 70–99)
Glucose-Capillary: 177 mg/dL — ABNORMAL HIGH (ref 70–99)
Glucose-Capillary: 52 mg/dL — ABNORMAL LOW (ref 70–99)

## 2018-06-09 LAB — CBC
HEMATOCRIT: 27.2 % — AB (ref 39.0–52.0)
Hemoglobin: 8.2 g/dL — ABNORMAL LOW (ref 13.0–17.0)
MCH: 28.5 pg (ref 26.0–34.0)
MCHC: 30.1 g/dL (ref 30.0–36.0)
MCV: 94.4 fL (ref 78.0–100.0)
PLATELETS: 180 10*3/uL (ref 150–400)
RBC: 2.88 MIL/uL — ABNORMAL LOW (ref 4.22–5.81)
RDW: 18.6 % — AB (ref 11.5–15.5)
WBC: 22.4 10*3/uL — ABNORMAL HIGH (ref 4.0–10.5)

## 2018-06-09 LAB — PROTIME-INR
INR: 1.96
PROTHROMBIN TIME: 22.2 s — AB (ref 11.4–15.2)

## 2018-06-09 LAB — LACTATE DEHYDROGENASE: LDH: 217 U/L — AB (ref 98–192)

## 2018-06-09 LAB — MRSA PCR SCREENING: MRSA by PCR: NEGATIVE

## 2018-06-09 LAB — PROCALCITONIN: PROCALCITONIN: 0.72 ng/mL

## 2018-06-09 MED ORDER — TRAMADOL HCL 50 MG PO TABS
100.0000 mg | ORAL_TABLET | Freq: Four times a day (QID) | ORAL | Status: DC | PRN
  Administered 2018-06-09 – 2018-06-11 (×6): 100 mg via ORAL
  Filled 2018-06-09 (×6): qty 2

## 2018-06-09 MED ORDER — CHLORHEXIDINE GLUCONATE CLOTH 2 % EX PADS
6.0000 | MEDICATED_PAD | Freq: Every day | CUTANEOUS | Status: DC
  Administered 2018-06-10: 6 via TOPICAL

## 2018-06-09 MED ORDER — LORAZEPAM 0.5 MG PO TABS
0.5000 mg | ORAL_TABLET | Freq: Four times a day (QID) | ORAL | Status: DC | PRN
  Administered 2018-06-09 – 2018-06-11 (×4): 0.5 mg via ORAL
  Filled 2018-06-09 (×4): qty 1

## 2018-06-09 MED ORDER — SODIUM CHLORIDE 0.9 % IV SOLN
2.0000 g | Freq: Once | INTRAVENOUS | Status: AC
  Administered 2018-06-09: 2 g via INTRAVENOUS
  Filled 2018-06-09: qty 2

## 2018-06-09 MED ORDER — VANCOMYCIN HCL IN DEXTROSE 750-5 MG/150ML-% IV SOLN
750.0000 mg | Freq: Once | INTRAVENOUS | Status: AC
  Administered 2018-06-10: 750 mg via INTRAVENOUS
  Filled 2018-06-09: qty 150

## 2018-06-09 MED ORDER — POTASSIUM CHLORIDE CRYS ER 20 MEQ PO TBCR
20.0000 meq | EXTENDED_RELEASE_TABLET | Freq: Once | ORAL | Status: AC
  Administered 2018-06-09: 20 meq via ORAL
  Filled 2018-06-09: qty 1

## 2018-06-09 MED ORDER — FUROSEMIDE 10 MG/ML IJ SOLN
20.0000 mg | Freq: Once | INTRAMUSCULAR | Status: DC
  Filled 2018-06-09: qty 2

## 2018-06-09 MED ORDER — WARFARIN SODIUM 2.5 MG PO TABS
2.5000 mg | ORAL_TABLET | Freq: Once | ORAL | Status: AC
  Administered 2018-06-09: 2.5 mg via ORAL
  Filled 2018-06-09: qty 1

## 2018-06-09 MED ORDER — MEGESTROL ACETATE 40 MG PO TABS
40.0000 mg | ORAL_TABLET | Freq: Every day | ORAL | Status: DC
  Administered 2018-06-09 – 2018-06-12 (×4): 40 mg via ORAL
  Filled 2018-06-09 (×4): qty 1

## 2018-06-09 MED ORDER — CHLORHEXIDINE GLUCONATE CLOTH 2 % EX PADS: 6.0000 | MEDICATED_PAD | Freq: Every day | CUTANEOUS | Status: DC

## 2018-06-09 NOTE — Plan of Care (Signed)
  Problem: Health Behavior/Discharge Planning: Goal: Ability to manage health-related needs will improve Outcome: Progressing   Problem: Clinical Measurements: Goal: Ability to maintain clinical measurements within normal limits will improve Outcome: Progressing Goal: Will remain free from infection Outcome: Progressing Goal: Diagnostic test results will improve Outcome: Progressing Goal: Respiratory complications will improve Outcome: Progressing Goal: Cardiovascular complication will be avoided Outcome: Progressing   Problem: Activity: Goal: Risk for activity intolerance will decrease Outcome: Progressing   Problem: Nutrition: Goal: Adequate nutrition will be maintained Outcome: Progressing   Problem: Elimination: Goal: Will not experience complications related to bowel motility Outcome: Progressing Goal: Will not experience complications related to urinary retention Outcome: Progressing   Problem: Safety: Goal: Ability to remain free from injury will improve Outcome: Progressing   Problem: Cardiac: Goal: LVAD will function as expected and patient will experience no clinical alarms Outcome: Progressing   Problem: Education: Goal: Patient will understand all VAD equipment and how it functions Outcome: Progressing Goal: Patient will be able to verbalize current INR target range and antiplatelet therapy for discharge home Outcome: Progressing

## 2018-06-09 NOTE — Consult Note (Signed)
Renal Service Consult Note [facility]  Trevor [name] 06/09/2018 [place] Requesting Physician:  Dr Haroldine Laws  Reason for Consult:  ESRD pt with SOB HPI: The patient is a 72 y.o. year-old with hx of LVAD/ severe DCM, hx CABG 2010, AS sp TAVR 2015, afib , DM2, asthma and prolonged hosp stay for recurrent GIB in hospital most of May / June this year, eSRD on HD MWFSat presenting w/ SOB and leg edema.  No cough or fevers.  Doesn't miss HD.  Asked to see for dialysis.   Has been getting to dry wt, goals have been quite low like 0.5- 1 kg  ROS  denies CP  no joint pain   no HA  no blurry vision  no rash  no diarrhea  no nausea/ vomiting  no dysuria  no difficulty voiding  no change in urine color    Past Medical History  Past Medical History:  Diagnosis Date  . AICD (automatic cardioverter/defibrillator) present   . Asthma   . AVM (arteriovenous malformation) of colon 07/07/2017  . CHF (congestive heart failure) ([place])   . Diabetes ([place])   . ESRF (end stage renal failure) ([place]) 07/07/2017   pt receiving hemodialysis Monday- Wednesday-friday, sometimes Saturday  . Kidney disease   . LVAD (left ventricular assist device) present ([place]) 12/2016  . Permanent atrial fibrillation ([place]) 07/07/2017  . Presence of permanent cardiac pacemaker    AICD  . Sleep apnea    Past Surgical History  Past Surgical History:  Procedure Laterality Date  . AORTIC VALVE REPLACEMENT  2015   [place]  . APPLICATION OF A-CELL OF EXTREMITY Left 11/20/2017   Procedure: APPLICATION OF A-CELL;  Surgeon: Wallace Going, DO;  Location: [place];  Service: Plastics;  Laterality: Left;  . AV FISTULA PLACEMENT Left 06/30/2017   Procedure: CREATION of LEFT ARM Brachiocephalic Fistula;  Surgeon: Conrad Banks, MD;  Location: [place];  Service: Vascular;  Laterality: Left;  . CARDIAC CATHETERIZATION N/A 12/02/2016   Procedure: Right Heart Cath;  Surgeon: Larey Dresser, MD;  Location: [place] CV LAB;  Service: Cardiovascular;  Laterality: N/A;  . COLONOSCOPY N/A 02/14/2017   Procedure: COLONOSCOPY;  Surgeon: Milus Banister, MD;  Location: [place];  Service: Endoscopy;  Laterality: N/A;  . COLONOSCOPY WITH PROPOFOL N/A 02/27/2018   Procedure: COLONOSCOPY WITH PROPOFOL;  Surgeon: Jerene Bears, MD;  Location: [place];  Service: Gastroenterology;  Laterality: N/A;  . CORONARY ANGIOPLASTY WITH STENT PLACEMENT  2013   in [place]  . CORONARY ARTERY BYPASS GRAFT  2010   in [place]  . DIALYSIS/PERMA CATHETER INSERTION  06/30/2017   Procedure: INSERTION Dialysis Catheter;  Surgeon: Conrad Hickory, MD;  Location: [facility] OR;  Service: Vascular;;  . ESOPHAGOGASTRODUODENOSCOPY (EGD) WITH PROPOFOL N/A 02/27/2018   Procedure: ESOPHAGOGASTRODUODENOSCOPY (EGD) WITH PROPOFOL;  Surgeon: Jerene Bears, MD;  Location: [facility] ENDOSCOPY;  Service: Gastroenterology;  Laterality: N/A;  . GIVENS CAPSULE STUDY N/A 02/27/2018   Procedure: GIVENS CAPSULE STUDY;  Surgeon: Jerene Bears, MD;  Location: [place];  Service: Gastroenterology;  Laterality: N/A;  . GIVENS CAPSULE STUDY N/A 03/25/2018   Procedure: GIVENS CAPSULE STUDY;  Surgeon: Jackquline Denmark, MD;  [facility];  Service: Endoscopy;  Laterality: N/A;  . IABP INSERTION N/A 12/11/2016   Procedure: IABP Insertion;  Surgeon: Larey Dresser, MD;  Location: [place] CV LAB;  Service: Cardiovascular;  Laterality: N/A;  . INSERTION OF IMPLANTABLE LEFT VENTRICULAR ASSIST  DEVICE N/A 12/13/2016   Procedure: INSERTION OF IMPLANTABLE LEFT VENTRICULAR ASSIST DEVICE;  Surgeon: Ivin Poot, MD;  Location: [place];  Service: Open Heart Surgery;  Laterality: N/A;  HEARTMATE II  NITRIC OXIDE  . IR FLUORO GUIDE CV LINE LEFT  06/17/2017  . IR US GUIDE VASC ACCESS LEFT  06/17/2017  . MASS EXCISION Left 11/20/2017   Procedure: EXCISION OF LEFT NECK AND RIGHT LEG SKIN CANCER WITH A CELL PLACEMENT;  Surgeon: Wallace Going, DO;  Location: [place];   Service: Plastics;  Laterality: Left;  Marland Kitchen MASS EXCISION Left 11/26/2017   Procedure: EXPLORATION AND EXCISION OF LEFT NECK;  Surgeon: Wallace Going, DO;  Location: [place];  Service: Plastics;  Laterality: Left;  . RIGHT HEART CATH N/A 12/11/2016   Procedure: Right Heart Cath;  Surgeon: Larey Dresser, MD;  Location: [place] CV LAB;  Service: Cardiovascular;  Laterality: N/A;  . RIGHT HEART CATH N/A 03/27/2017   Procedure: Right Heart Cath;  Surgeon: Larey Dresser, MD;  Location: [place] CV LAB;  Service: Cardiovascular;  Laterality: N/A;  . RIGHT HEART CATH N/A 06/13/2017   Procedure: RIGHT HEART CATH;  Surgeon: Jolaine Artist, MD;  Location: [place] CV LAB;  Service: Cardiovascular;  Laterality: N/A;  . TEE WITHOUT CARDIOVERSION N/A 12/13/2016   Procedure: TRANSESOPHAGEAL ECHOCARDIOGRAM (TEE);  Surgeon: Ivin Poot, MD;  Location: [place];  Service: Open Heart Surgery;  Laterality: N/A;  . TRICUSPID VALVE REPLACEMENT N/A 12/13/2016   Procedure: TRICUSPID VALVE REPAIR WITH EDWARDS MC 3 TRICUSPID ANNULOPLASTY RING MODEL 4900 SIZE T 28;  Surgeon: Ivin Poot, MD;  Location: [place];  Service: Open Heart Surgery;  Laterality: N/A;   Family History  Family History  Problem Relation Age of Onset  . Heart failure Father   . Heart attack Father   . Healthy Mother   . Diabetes Paternal Grandfather    Social History  reports that he has never smoked. He has never used smokeless tobacco. He reports that he does not drink alcohol or use drugs. Allergies No Known Allergies Home medications Prior to Admission medications   Medication Sig Start Date End Date Taking? Authorizing Provider  acetaminophen (TYLENOL) 325 MG tablet Take 2 tablets (650 mg total) by mouth every 4 (four) hours as needed for headache or mild pain. 03/10/18  Yes Shirley Friar, PA-C  albuterol (PROVENTIL HFA;VENTOLIN HFA) 108 (90 Base) MCG/ACT inhaler Inhale 2 puffs into the lungs every 4 (four) hours  as needed for wheezing or shortness of breath. 02/10/18  Yes Larey Dresser, MD  albuterol (PROVENTIL) (2.5 MG/3ML) 0.083% nebulizer solution Take 2.5 mg by nebulization every 4 (four) hours as needed for wheezing or shortness of breath.   Yes [provider]  atorvastatin (LIPITOR) 40 MG tablet TAKE 1 TABLET(40 MG) BY MOUTH DAILY Patient taking differently: Take 40 mg by mouth daily at 6 PM.  09/02/17  Yes Larey Dresser, MD  benzonatate (TESSALON) 100 MG capsule Take 1 capsule (100 mg total) by mouth 3 (three) times daily as needed for cough. 05/05/18  Yes Shirley Friar, PA-C  busPIRone (BUSPAR) 5 MG tablet Take 1 tablet (5 mg total) by mouth 2 (two) times daily. 01/22/18  Yes Bensimhon, Shaune Pascal, MD  docusate sodium (COLACE) 250 MG capsule Take 1 capsule (250 mg total) by mouth daily. 06/04/18  Yes Larey Dresser, MD  fluticasone (FLOVENT HFA) 110 MCG/ACT inhaler Inhale 2 puffs into the  lungs 2 (two) times daily. 02/10/18  Yes Larey Dresser, MD  hydrocerin (EUCERIN) CREA Apply 1 application topically 2 (two) times daily. Patient taking differently: Apply 1 application topically 2 (two) times daily as needed (dry skin).  05/19/18  Yes Shirley Friar, PA-C  insulin glargine (LANTUS) 100 UNIT/ML injection Use 4 units in morning and 6 units at bedtime. Patient taking differently: Inject 5 Units into the skin 2 (two) times daily.  05/19/18  Yes Love, Ivan Anchors, PA-C  levothyroxine (SYNTHROID, LEVOTHROID) 25 MCG tablet Take 1 tablet (25 mcg total) by mouth daily before breakfast. 10/02/17  Yes Larey Dresser, MD  multivitamin (RENA-VIT) TABS tablet Take 1 tablet by mouth at bedtime. 05/19/18  Yes Shirley Friar, PA-C  pantoprazole (PROTONIX) 40 MG tablet Take 1 tablet (40 mg total) by mouth 2 (two) times daily. 05/19/18  Yes Love, Ivan Anchors, PA-C  polyethylene glycol (MIRALAX / GLYCOLAX) packet Take 17 g by mouth daily as needed for mild constipation. 05/19/18  Yes  Shirley Friar, PA-C  senna-docusate (SENOKOT-S) 8.6-50 MG tablet Take 1 tablet by mouth 2 (two) times daily. Patient taking differently: Take 1 tablet by mouth 2 (two) times daily as needed (constipation).  05/05/18  Yes Shirley Friar, PA-C  sildenafil (REVATIO) 20 MG tablet Take 1 tablet (20 mg total) by mouth 3 (three) times daily. 05/19/18  Yes Love, Ivan Anchors, PA-C  traZODone (DESYREL) 50 MG tablet Take 0.5 tablets (25 mg total) by mouth at bedtime. 05/19/18  Yes Shirley Friar, PA-C  warfarin (COUMADIN) 5 MG tablet Take 2.5-5 mg by mouth See admin instructions. Take 1/2 tablet (2.5 mg) on Tuesday, Wednesday, Thursday nights, take 1 tablet (5 mg) on Sunday, Monday, Friday, Saturday nights   Yes [provider]  calcitRIOL (ROCALTROL) 0.5 MCG capsule Take 1 capsule (0.5 mcg total) by mouth every Monday, Wednesday, and Friday with hemodialysis. 03/11/18   Shirley Friar, PA-C  Darbepoetin Alfa (ARANESP) 200 MCG/0.4ML SOSY injection Inject 0.4 mLs (200 mcg total) into the vein every Monday with hemodialysis. 04/06/18   Darrick Grinder D, NP  warfarin (COUMADIN) 2.5 MG tablet Take 5 mg (2 tabs) M/W/F and 2.5 mg (1 tab) Tue/Thurs/Sat/Sunday Patient not taking: Reported on 06/08/2018 05/19/18   Shirley Friar, PA-C   Liver Function Tests No results for input(s): AST, ALT, ALKPHOS, BILITOT, PROT, ALBUMIN in the last 168 hours. No results for input(s): LIPASE, AMYLASE in the last 168 hours. CBC Recent Labs  Lab 06/08/18 1652 06/09/18 0323  WBC 23.4* 22.4*  HGB 8.9* 8.2*  HCT 29.9* 27.2*  MCV 96.1 94.4  PLT 222 462   Basic Metabolic Panel Recent Labs  Lab 06/08/18 1652 06/09/18 0323  NA 134* 133*  K 3.6 3.2*  CL 95* 95*  CO2 27 28  GLUCOSE 255* 218*  BUN 9 15  CREATININE 2.01* 2.36*  CALCIUM 8.3* 7.9*   Iron/TIBC/Ferritin/ %Sat    Component Value Date/Time   IRON 125 06/14/2017 1605   TIBC 221 (L) 06/14/2017 1605   FERRITIN 622 (H)  06/14/2017 1605   IRONPCTSAT 57 (H) 06/14/2017 1605    Vitals:   06/09/18 0416 06/09/18 0715 06/09/18 0741 06/09/18 1138  BP: 94/77  96/73 (!) 73/49  Pulse: 91  87 83  Resp: 18  14 19   Temp: 97.9 F (36.6 C)  98.5 F (36.9 C) 98.1 F (36.7 C)  TempSrc: Oral  Oral Oral  SpO2: 98% 100% 100% 98%  Weight: 76.4  kg (168 lb 6.9 oz)     Height:       Exam Gen alert, not in distress, calm No rash, cyanosis or gangrene Sclera anicteric, throat clear  No jvd or bruits Chest clear bilat to bases Cor  LVAD hum Abd soft ntnd no mass or ascites +bs GU normal male MS no joint effusions or deformity Ext 2+ bilat pitting LE edema, no wounds or ulcers Neuro is alert, Ox 3 , nf    Home meds:  - lipitor/ buspirone/ flovent hfa/ synthroid/ mvi/ PPI  - sildenafil 20 mg tid/ coumadin hs  - trazodone 25 mg qhs  Dialysis: MWFSat [facility]  4.5h   76.5kg   LUA AVF   Hep none  - last Hb 8.4, tsat 21% ferr 2105  - venofer 100 mg / hd, had 5 of 10  - mircera 225 last on 7/22, due next week  - calc 0.5 ug tiw     Impression: 1  Vol overload/ edema/ SOB : CXR w/o gross edema.  Plan HD today w/ max UF.  Must be losing body weight as wt gains at outpt HD have been very low lately in spite of obvious vol overload on exam.  2  ESRD - extra HD today, then again tomorrow. Usual MWFSat 3  ICM/ sp LVAD 4  CAD hx CABG 5  AS sp TAVRS 6  DM2   Plan - HD today upstairs, UF 2.5- 3 L max as tolerated  Kelly Splinter MD [employer] pager [phone]   06/09/2018, 3:19 PM

## 2018-06-09 NOTE — Progress Notes (Signed)
LVAD Coordinator Rounding Note:  HM II LVAD implanted on 12/13/16 by Dr. Darcey Nora under Destination Therapy criteria due to age excluding heart transplantation.  Admitted 06/08/18 from ED for recurrent severe weakness, nausea, and SOB after completing HD today.   Pt lying in bed this am. Says he has nausea and was unable to eat breakfast. Says he has not had nausea at home, but does have decreased appetite. He also says his stamina has been decreasing, but does have home PT.  Vital signs: Temp:  98.1 HR: 83 Automatic BP: 73/49 (58) Doppler: 76 O2 Sat: 99% RA Wt: 169>171>168 lbs  LVAD interrogation reveals:  Speed:  9200 Flow:  5.2 Power: 5.1 w PI: 4.3 Alarms: none Events:  Fixed speed: 9200 Low speed limit: 8600   Drive Line:  Right abd dressing CDI. Next dressing change due today.  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 Margins not clear at neck. - 02/23/18> hospitalization for GI bleed. Colon/EGD 02/27/18 with normal EGD. Colonoscopy identified active bleeding in ileum; source not found. Capsule Endoscopy 02/27/18: Showed active bleeding in the ileum. Received 5 units blood with histamine reaction; will give benadryl as OP. - 03/10/18>transferrred to [facility] for double retro balloon scop and sent to IR emolization x 3 with no evidence of bleeding. Received 12 units PCs, remained off anticoagulants. Transferred back to [facility] 03/20/18 Seen by IR 5/20. CTA obtained, but no active bleeding  found. Started on octreotide. 03/25/18: Repeat capsule endoscopy completed. Bleeding appears to come from the mid-ileum. 03/26/18: General surgery reconsulted. Recommended transfer back to [facility] for surgery with intraoperative endoscopy. [name] currently discussing options. Ortho consulted 5/24 for left knee pain. S/p knee aspiration and steroid injection.  Dose of premarin given 5/24.  On 5/25 developed abdominal bloating and distension with n/v. KUB suggestive of ileus versus early SBO. NGT placed for decompression.  Had large bloody BM after this and felt much better, diet now advanced to regular.  - 04/01/18 transferred to [facility] for  laparoscopic surgical enteroscopy for recurrent GI bleed   Plan/Recommendations: 1.  Weekly dressing changes per VAD coordinator. Next dressing change due today. 2.  Call VAD pager if any questions re: VAD equipment or drive line site issues.   [name] RN, VAD Coordinator 24/7 VAD pager: [phone]

## 2018-06-09 NOTE — Progress Notes (Signed)
[facility]  Patient Status: Active (receiving services up to time of hospitalization)  [facility] is providing the following services: RN and PT  If patient discharges after hours, please call [phone].   Trevor [name] 06/09/2018, 10:24 AM

## 2018-06-09 NOTE — Progress Notes (Addendum)
Advanced Heart Failure VAD Team Note  PCP-Cardiologist: No primary care provider on file.   Subjective:   SOB this am. Receiving breathing treatment.   Complaining of fatigue. SOB at rest.    LVAD INTERROGATION:  HeartMate II LVAD:   Flow 5.1  liters/min, speed 9200, power 6, PI 5.4     Objective:    Vital Signs:   Temp:  [97.7 F (36.5 C)-99.3 F (37.4 C)] 97.9 F (36.6 C) (08/06 0416) Pulse Rate:  [91-103] 91 (08/06 0416) Resp:  [18-22] 18 (08/06 0416) BP: (89-105)/(62-78) 94/77 (08/06 0416) SpO2:  [95 %-100 %] 100 % (08/06 0715) Weight:  [168 lb 6.9 oz (76.4 kg)-171 lb 15.3 oz (78 kg)] 168 lb 6.9 oz (76.4 kg) (08/06 0416)   Mean arterial Pressure 70-80s   Intake/Output:  No intake or output data in the 24 hours ending 06/09/18 0727   Physical Exam    General:  Sitting on the side of the bed. No resp difficulty HEENT: normal Neck: supple. JVP ~10 . Carotids 2+ bilat; no bruits. No lymphadenopathy or thyromegaly appreciated. Cor: Mechanical heart sounds with LVAD hum present. Lungs: Crackles on 2 liters.  Abdomen: soft, nontender, nondistended. No hepatosplenomegaly. No bruits or masses. Good bowel sounds. Driveline: C/D/I; securement device intact and driveline incorporated Extremities: no cyanosis, clubbing, rash, R and LLE 1-2+ edema Neuro: alert & orientedx3, cranial nerves grossly intact. moves all 4 extremities w/o difficulty. Affect flat    Telemetry   A Fib   EKG    N/A  Labs   Basic Metabolic Panel: Recent Labs  Lab 06/08/18 1652 06/09/18 0323  NA 134* 133*  K 3.6 3.2*  CL 95* 95*  CO2 27 28  GLUCOSE 255* 218*  BUN 9 15  CREATININE 2.01* 2.36*  CALCIUM 8.3* 7.9*    Liver Function Tests: No results for input(s): AST, ALT, ALKPHOS, BILITOT, PROT, ALBUMIN in the last 168 hours. No results for input(s): LIPASE, AMYLASE in the last 168 hours. No results for input(s): AMMONIA in the last 168 hours.  CBC: Recent Labs  Lab  06/08/18 1652 06/09/18 0323  WBC 23.4* 22.4*  HGB 8.9* 8.2*  HCT 29.9* 27.2*  MCV 96.1 94.4  PLT 222 180    INR: Recent Labs  Lab 06/08/18 1652 06/09/18 0323  INR 1.73 1.96    Other results:  EKG:    Imaging   Dg Chest 2 View  Result Date: 06/08/2018 CLINICAL DATA:  Short of breath and chest pain since this morning EXAM: CHEST - 2 VIEW COMPARISON:  05/25/2018 FINDINGS: Stable right subclavian AICD and aortic valve replacement hardware. Stable LVAD device. Normal heart size. Lungs under aerated with bibasilar atelectasis. Pulmonary vascularity is within normal limits. IMPRESSION: Bibasilar atelectasis. Electronically Signed   By: Marybelle Killings M.D.   On: 06/08/2018 17:28      Medications:     Scheduled Medications: . atorvastatin  40 mg Oral q1800  . budesonide  0.25 mg Nebulization BID  . busPIRone  5 mg Oral BID  . [START ON 06/10/2018] calcitRIOL  0.5 mcg Oral Q M,W,F-HD  . docusate sodium  200 mg Oral Daily  . insulin aspart  0-15 Units Subcutaneous TID WC  . levothyroxine  25 mcg Oral QAC breakfast  . multivitamin  1 tablet Oral QHS  . pantoprazole  40 mg Oral BID  . senna-docusate  1 tablet Oral BID  . sildenafil  20 mg Oral TID  . traZODone  25 mg Oral QHS  .  Warfarin - Pharmacist Dosing Inpatient   Does not apply q1800     Infusions: . [START ON 06/10/2018] ceFEPime (MAXIPIME) IV    . [START ON 06/10/2018] vancomycin       PRN Medications:  acetaminophen, albuterol, benzonatate, ondansetron (ZOFRAN) IV, polyethylene glycol, traMADol   Patient Profile     Trevor [name] is a 72 y.o. male with a history of CAD s/p CABG x 4 2010, OSA, AS with TAVR 2015, CKD, DM2, paroxysmal atrial fibrillation, asthma, ESRD on HD, ischemic cardiomyopathy with Medtronic ICD, and  HMII.   Admitted with A/C systolic heart failure and elevated WBC.    Assessment/Plan:    1. Acute on Chronic systolic CHF: - Ischemic cardiomyopathy, EF 20-25% with RV dysfunction on  2/18 echo pre-LVAD.  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  Medtronic ICD.  He has had problems with RV failure post-op.  -  Will admit for possible additional HD in-house - He states he does not think he can tolerate HD much longer and if not PD candidate may need to involve Hospice team to discuss [place].  - Will notify Nephrology.   2. ESRD:  - Very poor tolerance of HD, and seems to be getting worse. He states he does not think he can tolerate HD much longer and if not PD candidate may need to re-engage Hospice team to discuss [place].  - Consult Nephrology.   3. VAD: - VAD interrogated personally. Parameters stable. - 8/4. One "No External Power" alarm on VAD in setting of unplugging both batteries at same time. We discussed this.  - Continue warfarin with INR goal 1.8 - 2.3. INR 1.96.  - Discussed personally with pharmacy.  -LDH stable.   4. CAD: s/p CABG - No s/s of ischemia.    - He is on atorvastatin. Off ASA with GI bleed.    5.  Atrial fibrillation: Chronic.   - On warfarin. INR pending .   6. RV failure:  - Continue Revatio at 40 mg tid.    7. 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  - Hgb 8.2 . Per pt, renal goal is Hgb > 11.0.   - Continue Aranesp   8. GI bleeding: GI AVMs.  - Double balloon retro endoscopy at [facility], unable to identify bleeding lesion. He failed attempted IR embolization x 3 while at [facility]. 12 units PRBCs at [facility]. Received 15 uPRBC while at [facility]. IR consulted, but did not find any active bleeding on CTA 5/20. Repeat capsule study showed active bleeding in the mid ileum.Surgery was reconsulted and recommended transfer back to [facility] for possible surgery. - Now s/p laparoscopic surgical enteroscopy with 4 clips on 6/7/19at [name], c/b ischemic bowel/SBO. No evidence currently for overt bleeding.  - Tolerating warfarin. Goal INR 1.8-2.3, adjust to keep in this range.  - Getting Aranesp per nephrology.  -  Continue octreotide   9. Squamous cell skin cancer: - Neck excision site without clear margins.  He saw Dr Lisbeth Renshaw with radiation oncology, radiation was recommended.  He wants to hold off for the time being, does not think he can handle radiation as HD wears him out 4 days/week.  - No change to current plan.    10. H/o TAVR: - Stable on last echo.   11. H/o eptic Left Knee - s/p I&D and surgical wash out at [facility] - Stable. Had f/u with ortho 06/02/18. Site looks ok today  12. ID:  WBC 23 Blood CX obtained 8/5 Continue Vanc and Cefepime.   13. Hypokalemia K 3.2 Give 20 meq potassium   Consult nephrology   I reviewed the LVAD parameters from today, and compared the results to the patient's prior recorded data.  No programming changes were made.  The LVAD is functioning within specified parameters.  The patient performs LVAD self-test daily.  LVAD interrogation was negative for any significant power changes, alarms or PI events/speed drops.  LVAD equipment check completed and is in good working order.  Back-up equipment present.   LVAD education done on emergency procedures and precautions and reviewed exit site care.  Length of Stay: 1  Darrick Grinder, NP 06/09/2018, 7:27 AM  VAD Team --- VAD ISSUES ONLY--- Pager [phone] (7am - 7am)  Advanced Heart Failure Team  Pager [phone] (M-F; 7a - 4p)  Please contact [facility] for night-coverage after hours (4p -7a ) and weekends on [email or web address]  Patient seen with NP, agree with the above note.  He is volume overloaded on exam and has been more short of breath with cough.  I suspect that his dry weight needs to be decreased.  He likely lost a fair amount of muscle mass during recent prolonged hospitalization.   - I talked with Dr. Jonnie Finner, he will be dialyzed again today and then tomorrow.  - PD is not likely to be an option with recent abdominal surgery, Dr. Jonnie Finner to discuss with Dr. Jimmy Footman.   Looking longer term, he has been having  trouble with HD.  Generally does not feel good on HD days.  We talked about when he will have had "enough."  He says that he is still doing well enough that he would be willing to try to get more fluid dialyzed off if the only other option would be hospice.  He will tell me when he thinks that he cannot tolerate HD any longer.   Loralie Champagne 06/09/2018 4:47 PM

## 2018-06-09 NOTE — Progress Notes (Signed)
Left abdominal sorbaview dressing dry but loose.Existing VAD dressing removed and site care performed. Dressing changed using sterile technique. Drive line exit site cleansed with saline and allowed to dry then reapplied sorbaview dressing without biopatch. New Drive line anchor applied. Next dressing change due in a week 06/16/2018

## 2018-06-09 NOTE — Progress Notes (Signed)
Patient c/o increased shortness of breath MD notified and will come to see him this morning. SaT'S  stable 100% 2LNC. I will continue to monitor.

## 2018-06-09 NOTE — Progress Notes (Signed)
ANTICOAGULATION CONSULT NOTE Pharmacy Consult for Warfarin Indication: LVAD  No Known Allergies  Patient Measurements: Height: 5\' 5"  (165.1 cm) Weight: 168 lb 6.9 oz (76.4 kg) IBW/kg (Calculated) : 61.5  Vital Signs: Temp: 97.9 F (36.6 C) (08/06 0416) Temp Source: Oral (08/06 0416) BP: 94/77 (08/06 0416) Pulse Rate: 91 (08/06 0416)  Labs: Recent Labs    06/08/18 1652 06/09/18 0323  HGB 8.9* 8.2*  HCT 29.9* 27.2*  PLT 222 180  LABPROT 20.1* 22.2*  INR 1.73 1.96  CREATININE 2.01* 2.36*    Estimated Creatinine Clearance: 27 mL/min (A) (by C-G formula based on SCr of 2.36 mg/dL (H)).   Medical History: Past Medical History:  Diagnosis Date  . AICD (automatic cardioverter/defibrillator) present   . Asthma   . AVM (arteriovenous malformation) of colon 07/07/2017  . CHF (congestive heart failure) ([place])   . Diabetes ([place])   . ESRF (end stage renal failure) ([place]) 07/07/2017   pt receiving hemodialysis Monday- Wednesday-friday, sometimes Saturday  . Kidney disease   . LVAD (left ventricular assist device) present ([place]) 12/2016  . Permanent atrial fibrillation ([place]) 07/07/2017  . Presence of permanent cardiac pacemaker    AICD  . Sleep apnea     Assessment: 72 YO M with PMH of CAD s/p CABG x 4 2010, OSA, AS with TAVR 2015, DM2, LVAD, CHF, Afib, ESRD on HD, GI bleed presenting with CP and SOB.   INR this am is within goal range at 1.96, hgb trending down slightly at 8.2 this am. No bleeding issues noted overnight, LDH stable. Will continue home dose of warfarin tonight.   Warfarin PTA dose 5mg  SMFSa, 2.5mg  TWTh. INR today is slightly subtherapeutic at 1.73. No reports of bleeding.   Goal of Therapy:  INR 1.8-2.3 Monitor platelets by anticoagulation protocol: Yes   Plan:  Warfarin 2.5mg  tonight Daily INR for now  Erin Hearing PharmD., BCPS Clinical Pharmacist 06/09/2018 7:30 AM

## 2018-06-10 LAB — BASIC METABOLIC PANEL
Anion gap: 12 (ref 5–15)
BUN: 8 mg/dL (ref 8–23)
CALCIUM: 8.3 mg/dL — AB (ref 8.9–10.3)
CO2: 29 mmol/L (ref 22–32)
CREATININE: 1.64 mg/dL — AB (ref 0.61–1.24)
Chloride: 94 mmol/L — ABNORMAL LOW (ref 98–111)
GFR calc Af Amer: 47 mL/min — ABNORMAL LOW (ref >60)
GFR calc non Af Amer: 40 mL/min — ABNORMAL LOW (ref >60)
GLUCOSE: 160 mg/dL — AB (ref 70–99)
Potassium: 3.9 mmol/L (ref 3.5–5.1)
Sodium: 135 mmol/L (ref 135–145)

## 2018-06-10 LAB — CBC
HEMATOCRIT: 29 % — AB (ref 39.0–52.0)
Hemoglobin: 8.5 g/dL — ABNORMAL LOW (ref 13.0–17.0)
MCH: 28.2 pg (ref 26.0–34.0)
MCHC: 29.3 g/dL — AB (ref 30.0–36.0)
MCV: 96.3 fL (ref 78.0–100.0)
Platelets: 203 10*3/uL (ref 150–400)
RBC: 3.01 MIL/uL — ABNORMAL LOW (ref 4.22–5.81)
RDW: 18.6 % — AB (ref 11.5–15.5)
WBC: 14.3 10*3/uL — ABNORMAL HIGH (ref 4.0–10.5)

## 2018-06-10 LAB — GLUCOSE, CAPILLARY
Glucose-Capillary: 160 mg/dL — ABNORMAL HIGH (ref 70–99)
Glucose-Capillary: 177 mg/dL — ABNORMAL HIGH (ref 70–99)
Glucose-Capillary: 205 mg/dL — ABNORMAL HIGH (ref 70–99)
Glucose-Capillary: 130 mg/dL — ABNORMAL HIGH (ref 70–99)

## 2018-06-10 LAB — PROTIME-INR
INR: 1.91
Prothrombin Time: 21.8 seconds — ABNORMAL HIGH (ref 11.4–15.2)

## 2018-06-10 LAB — LACTATE DEHYDROGENASE: LDH: 205 U/L — ABNORMAL HIGH (ref 98–192)

## 2018-06-10 LAB — MAGNESIUM: Magnesium: 1.6 mg/dL — ABNORMAL LOW (ref 1.7–2.4)

## 2018-06-10 LAB — PROCALCITONIN: Procalcitonin: 0.61 ng/mL

## 2018-06-10 MED ORDER — VANCOMYCIN HCL IN DEXTROSE 750-5 MG/150ML-% IV SOLN
INTRAVENOUS | Status: AC
  Administered 2018-06-10: 750 mg
  Filled 2018-06-10: qty 150

## 2018-06-10 MED ORDER — CALCITRIOL 0.5 MCG PO CAPS
ORAL_CAPSULE | ORAL | Status: AC
  Administered 2018-06-10: 0.5 ug
  Filled 2018-06-10: qty 1

## 2018-06-10 MED ORDER — SODIUM CHLORIDE 0.9 % IV SOLN
INTRAVENOUS | Status: DC | PRN
  Administered 2018-06-10: 500 mL via INTRAVENOUS

## 2018-06-10 MED ORDER — SODIUM CHLORIDE 0.9 % IV SOLN: 100.0000 mL | INTRAVENOUS | Status: DC | PRN

## 2018-06-10 MED ORDER — PENTAFLUOROPROP-TETRAFLUOROETH EX AERO: 1.0000 "application " | INHALATION_SPRAY | CUTANEOUS | Status: DC | PRN

## 2018-06-10 MED ORDER — LIDOCAINE-PRILOCAINE 2.5-2.5 % EX CREA: 1.0000 "application " | TOPICAL_CREAM | CUTANEOUS | Status: DC | PRN

## 2018-06-10 MED ORDER — WARFARIN SODIUM 2.5 MG PO TABS
2.5000 mg | ORAL_TABLET | Freq: Once | ORAL | Status: AC
  Administered 2018-06-10: 2.5 mg via ORAL
  Filled 2018-06-10: qty 1

## 2018-06-10 NOTE — Progress Notes (Addendum)
Advanced Heart Failure VAD Team Note  PCP-Cardiologist: No primary care provider on file.   Subjective:    Had HD overnight with 3.3 L off. Weight down 3 lbs. Labs not yet done this am.  Feels okay this morning. Denies CP, SOB, nausea, fever, or chills. Says it's too early to tell whether he feels okay after HD. He wants to try more aggressive fluid removal for now.  He had 2 5 beat runs of NSVT overnight. No PI events.  LVAD INTERROGATION:  HeartMate II LVAD:   Flow 4.4  liters/min, speed 9200, power 5, PI 5.8. No PI events/24 hours.      Objective:    Vital Signs:   Temp:  [97 F (36.1 C)-98.5 F (36.9 C)] 98.4 F (36.9 C) (08/07 0415) Pulse Rate:  [51-96] 85 (08/07 0415) Resp:  [14-21] 19 (08/07 0415) BP: (73-103)/(49-91) 102/74 (08/07 0415) SpO2:  [98 %-100 %] 99 % (08/07 0415) Weight:  [165 lb 9.1 oz (75.1 kg)-173 lb 15.1 oz (78.9 kg)] 165 lb 9.1 oz (75.1 kg) (08/07 0415)   Mean arterial Pressure 70-90   Intake/Output:   Intake/Output Summary (Last 24 hours) at 06/10/2018 0710 Last data filed at 06/10/2018 0352 Gross per 24 hour  Intake 840 ml  Output 3300 ml  Net -2460 ml     Physical Exam    General: NAD.  HEENT: Normal. Neck: Supple, JVP 10-12 cm. Carotids OK.  Cardiac:  Mechanical heart sounds with LVAD hum present.  Lungs:  CTAB, normal effort.  Abdomen:  NT, ND, no HSM. No bruits or masses. +BS  LVAD exit site: Well-healed and incorporated. Dressing dry and intact. No erythema or drainage. Stabilization device present and accurately applied. Driveline dressing changed daily per sterile technique. Extremities:  Warm and dry. No cyanosis, clubbing, rash, BLE 1+ edema. Scab on left knee Neuro:  Alert & oriented x 3. Cranial nerves grossly intact. Moves all 4 extremities w/o difficulty. Affect pleasant     Telemetry   Afib 90-100s. Personally reviewed.   EKG    06/08/18: Afib 115, RBBB, QTc 708 ms. Personally reviewed.   Labs   Basic Metabolic  Panel: Recent Labs  Lab 06/08/18 1652 06/09/18 0323  NA 134* 133*  K 3.6 3.2*  CL 95* 95*  CO2 27 28  GLUCOSE 255* 218*  BUN 9 15  CREATININE 2.01* 2.36*  CALCIUM 8.3* 7.9*    Liver Function Tests: No results for input(s): AST, ALT, ALKPHOS, BILITOT, PROT, ALBUMIN in the last 168 hours. No results for input(s): LIPASE, AMYLASE in the last 168 hours. No results for input(s): AMMONIA in the last 168 hours.  CBC: Recent Labs  Lab 06/08/18 1652 06/09/18 0323  WBC 23.4* 22.4*  HGB 8.9* 8.2*  HCT 29.9* 27.2*  MCV 96.1 94.4  PLT 222 180    INR: Recent Labs  Lab 06/08/18 1652 06/09/18 0323  INR 1.73 1.96    Other results:  EKG:    Imaging   Dg Chest 2 View  Result Date: 06/08/2018 CLINICAL DATA:  Short of breath and chest pain since this morning EXAM: CHEST - 2 VIEW COMPARISON:  05/25/2018 FINDINGS: Stable right subclavian AICD and aortic valve replacement hardware. Stable LVAD device. Normal heart size. Lungs under aerated with bibasilar atelectasis. Pulmonary vascularity is within normal limits. IMPRESSION: Bibasilar atelectasis. Electronically Signed   By: Marybelle Killings M.D.   On: 06/08/2018 17:28     Medications:     Scheduled Medications: . atorvastatin  40  mg Oral q1800  . budesonide  0.25 mg Nebulization BID  . busPIRone  5 mg Oral BID  . calcitRIOL  0.5 mcg Oral Q M,W,F-HD  . Chlorhexidine Gluconate Cloth  6 each Topical Q0600  . Chlorhexidine Gluconate Cloth  6 each Topical Q0600  . docusate sodium  200 mg Oral Daily  . insulin aspart  0-15 Units Subcutaneous TID WC  . levothyroxine  25 mcg Oral QAC breakfast  . megestrol  40 mg Oral Daily  . multivitamin  1 tablet Oral QHS  . pantoprazole  40 mg Oral BID  . senna-docusate  1 tablet Oral BID  . sildenafil  20 mg Oral TID  . traZODone  25 mg Oral QHS  . Warfarin - Pharmacist Dosing Inpatient   Does not apply q1800    Infusions: . ceFEPime (MAXIPIME) IV    . vancomycin      PRN  Medications: acetaminophen, albuterol, benzonatate, LORazepam, ondansetron (ZOFRAN) IV, polyethylene glycol, traMADol   Patient Profile     Trevor [name] is a 72 y.o. male with a history of CAD s/p CABG x 4 2010, OSA, AS with TAVR 2015, CKD, DM2, paroxysmal atrial fibrillation, asthma, ESRD on HD, ischemic cardiomyopathy with Medtronic ICD, and  HMII.   Admitted with A/C systolic heart failure and elevated WBC.    Assessment/Plan:    1. Acute on Chronic systolic CHF: - Ischemic cardiomyopathy, EF 20-25% with RV dysfunction on 2/18 echo pre-LVAD.  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  Medtronic ICD.  He has had problems with RV failure post-op.  - Admitted with volume overload. - Nephrology following and took off extra fluid yesterday. Not a good candidate for PD with recent abdominal surgery, but Dr Jonnie Finner and Dr Deterding will discuss further.   2. ESRD:  - Very poor tolerance of HD, and seems to be getting worse. He states he does not think he can tolerate HD much longer and if not PD candidate may need to re-engage Hospice team to discuss [place].  - Nephrology following. They think he is losing body weight because weights have been at goal outpatient despite volume overload. PD not a good option for him with recent abdominal surgery.  Dr. Jonnie Finner to discuss with Dr. Jimmy Footman. - He tells me today that he wants to try more aggressive fluid removal. He is not counting on PD being an option.   3. VAD: - VAD interrogated personally. Parameters stable. - 8/4. One "No External Power" alarm on VAD in setting of unplugging both batteries at same time. We discussed this.  - Continue warfarin with INR goal 1.8 - 2.3. INR pending.  - Discussed personally with pharmacy.  -LDH pending  4. CAD: s/p CABG - No s/s ischemia.  - He is on atorvastatin. Off ASA with GI bleed.    5.  Atrial fibrillation: Chronic.   - On warfarin. INR pending  6. RV failure:  - Continue Revatio at 20 mg tid.  No  change.   7. 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  - Per pt, renal goal is Hgb > 11.0.   - Continue Aranesp. CBC pending.  8. GI bleeding: GI AVMs.  - Double balloon retro endoscopy at [facility], unable to identify bleeding lesion. He failed attempted IR embolization x 3 while at [facility]. 12 units PRBCs at [facility]. Received 15 uPRBC while at [facility]. IR consulted, but did not find any active bleeding on CTA 5/20.  Repeat capsule study showed active bleeding in the mid ileum.Surgery was reconsulted and recommended transfer back to [facility] for possible surgery. - Now s/p laparoscopic surgical enteroscopy with 4 clips on 6/7/19at [name], c/b ischemic bowel/SBO. No evidence currently for overt bleeding.  - Tolerating warfarin. Goal INR 1.8-2.3, adjust to keep in this range.  - Getting Aranesp per nephrology.   9. Squamous cell skin cancer: - Neck excision site without clear margins.  He saw Dr Lisbeth Renshaw with radiation oncology, radiation was recommended.  He wants to hold off for the time being, does not think he can handle radiation as HD wears him out 4 days/week.  - No change  10. H/o TAVR: - Stable on last echo. No change.   11. H/o Septic Left Knee - s/p I&D and surgical wash out at [facility] - Stable. Had f/u with ortho 06/02/18. Site looks ok today  12. ID:  CBC pending Blood 8/5 NGTD Continue Vanc and Cefepime.   13. Hypokalemia - BMET pending  14. NSVT - BMET pending. Will check mag.   I reviewed the LVAD parameters from today, and compared the results to the patient's prior recorded data.  No programming changes were made.  The LVAD is functioning within specified parameters.  The patient performs LVAD self-test daily.  LVAD interrogation was negative for any significant power changes, alarms or PI events/speed drops.  LVAD equipment check completed and is in good working order.  Back-up equipment present.   LVAD education done on  emergency procedures and precautions and reviewed exit site care.  Length of Stay: [place], NP 06/10/2018, 7:10 AM  VAD Team --- VAD ISSUES ONLY--- Pager [phone] (7am - 7am)  Advanced Heart Failure Team  Pager [phone] (M-F; 7a - 4p)  Please contact [facility] for night-coverage after hours (4p -7a ) and weekends on [email or web address]  Patient seen with NP, agree with the above note.  He was volume overloaded on exam when admitted and has been more short of breath with cough recently.  I suspect that his dry weight needs to be decreased.  He likely lost a fair amount of muscle mass during recent prolonged hospitalization.   - He had HD last night with more fluid removed than usual, will hopefully have HD again this afternoon then can try to get him home tomorrow with plan for lower dry weight at HD if breathing remains improved (better today).  - PD is not likely to be an option with recent abdominal surgery, Dr. Jonnie Finner to discuss with Dr. Jimmy Footman.   Looking longer term, he has been having trouble with HD.  Generally does not feel good on HD days.  We talked about when he will have had "enough."  He says that he is still doing well enough that he would be willing to try to get more fluid dialyzed off if the only other option would be hospice.  He will tell me when he thinks that he cannot tolerate HD any longer.   Loralie Champagne 06/10/2018 8:46 AM

## 2018-06-10 NOTE — Progress Notes (Signed)
[facility] Progress Note  Subjective: 3.3 L off w HD yesterday, on HD now.   Vitals:   06/10/18 1128 06/10/18 1200 06/10/18 1201 06/10/18 1205  BP: 101/79 (!) 104/48 116/81 102/81  Pulse: 100 83 87 87  Resp: (!) 23 20    Temp: 98.1 F (36.7 C) 97.8 F (36.6 C)    TempSrc: Oral Oral    SpO2: 96% 96%    Weight:  74.4 kg (164 lb 0.4 oz)    Height:        Inpatient medications: . atorvastatin  40 mg Oral q1800  . budesonide  0.25 mg Nebulization BID  . busPIRone  5 mg Oral BID  . calcitRIOL  0.5 mcg Oral Q M,W,F-HD  . Chlorhexidine Gluconate Cloth  6 each Topical Q0600  . Chlorhexidine Gluconate Cloth  6 each Topical Q0600  . docusate sodium  200 mg Oral Daily  . insulin aspart  0-15 Units Subcutaneous TID WC  . levothyroxine  25 mcg Oral QAC breakfast  . megestrol  40 mg Oral Daily  . multivitamin  1 tablet Oral QHS  . pantoprazole  40 mg Oral BID  . senna-docusate  1 tablet Oral BID  . sildenafil  20 mg Oral TID  . traZODone  25 mg Oral QHS  . warfarin  2.5 mg Oral ONCE-1800  . Warfarin - Pharmacist Dosing Inpatient   Does not apply q1800   . sodium chloride    . ceFEPime (MAXIPIME) IV    . vancomycin     sodium chloride, acetaminophen, albuterol, benzonatate, lidocaine-prilocaine, LORazepam, ondansetron (ZOFRAN) IV, pentafluoroprop-tetrafluoroeth, polyethylene glycol, traMADol  Iron/TIBC/Ferritin/ %Sat    Component Value Date/Time   IRON 125 06/14/2017 1605   TIBC 221 (L) 06/14/2017 1605   FERRITIN 622 (H) 06/14/2017 1605   IRONPCTSAT 57 (H) 06/14/2017 1605    Exam: Gen alert, not in distress, calm No jvd or bruits Chest clear bilat to bases Cor  LVAD hum Abd soft ntnd no mass or ascites +bsy Ext still has 2+ bilat pitting LE edema Neuro is alert, Ox 3 , nf    Home meds:  - lipitor/ buspirone/ flovent hfa/ synthroid/ mvi/ PPI  - sildenafil 20 mg tid/ coumadin hs  - trazodone 25 mg qhs  Dialysis: MWFSat [facility]  4.5h   76.5kg   LUA AVF    Hep none  - last Hb 8.4, tsat 21% ferr 2105  - venofer 100 mg / hd, had 5 of 10  - mircera 225 last on 7/22, due next week  - calc 0.5 ug tiw     Impression: 1  Vol overload/ edema/ SOB : CXR w/o gross edema. SOB better after 3.3 L off yest on HD.  HD again today to get back on schedule, max UF 2.5- 3 L as tolerated.  Needs further lowering of dry wt.  Will see how he does on HD today, consider extra 3hr HD tomorrow prior to dc if needed.   2  ESRD - MWFSat dialysis.  Have d/w primary nephrologist, patient not a PD candidate 3  ICM/ sp LVAD 4  CAD hx CABG 5  AS sp TAVRS 6  DM2 7  FTT  8  Dispo - per primary team   Plan - as above   Kelly Splinter MD [place] pager [phone]   06/10/2018, 1:54 PM   Recent Labs  Lab 06/09/18 0323 06/10/18 0737  NA 133* 135  K 3.2* 3.9  CL 95* 94*  CO2 28 29  GLUCOSE 218* 160*  BUN 15 8  CREATININE 2.36* 1.64*  CALCIUM 7.9* 8.3*  INR 1.96 1.91   No results for input(s): AST, ALT, ALKPHOS, BILITOT, PROT in the last 168 hours. Recent Labs  Lab 06/09/18 0323 06/10/18 0737  WBC 22.4* 14.3*  HGB 8.2* 8.5*  HCT 27.2* 29.0*  MCV 94.4 96.3  PLT 180 203

## 2018-06-10 NOTE — Plan of Care (Signed)
  Problem: Health Behavior/Discharge Planning: Goal: Ability to manage health-related needs will improve Outcome: Progressing   Problem: Clinical Measurements: Goal: Ability to maintain clinical measurements within normal limits will improve Outcome: Progressing Goal: Will remain free from infection Outcome: Progressing Goal: Diagnostic test results will improve Outcome: Progressing Goal: Respiratory complications will improve Outcome: Progressing Goal: Cardiovascular complication will be avoided Outcome: Progressing   Problem: Activity: Goal: Risk for activity intolerance will decrease Outcome: Progressing   Problem: Nutrition: Goal: Adequate nutrition will be maintained Outcome: Progressing   Problem: Elimination: Goal: Will not experience complications related to bowel motility Outcome: Progressing Goal: Will not experience complications related to urinary retention Outcome: Progressing   Problem: Safety: Goal: Ability to remain free from injury will improve Outcome: Progressing   Problem: Cardiac: Goal: LVAD will function as expected and patient will experience no clinical alarms Outcome: Progressing   Problem: Education: Goal: Patient will understand all VAD equipment and how it functions Outcome: Progressing Goal: Patient will be able to verbalize current INR target range and antiplatelet therapy for discharge home Outcome: Progressing

## 2018-06-10 NOTE — Progress Notes (Signed)
ANTICOAGULATION CONSULT NOTE Pharmacy Consult for Warfarin Indication: LVAD  No Known Allergies  Patient Measurements: Height: 5\' 5"  (165.1 cm) Weight: 165 lb 9.1 oz (75.1 kg) IBW/kg (Calculated) : 61.5  Vital Signs: Temp: 98.3 F (36.8 C) (08/07 0739) Temp Source: Oral (08/07 0739) BP: 115/85 (08/07 0739) Pulse Rate: 110 (08/07 0739)  Labs: Recent Labs    06/08/18 1652 06/09/18 0323 06/10/18 0737  HGB 8.9* 8.2*  --   HCT 29.9* 27.2*  --   PLT 222 180  --   LABPROT 20.1* 22.2* 21.8*  INR 1.73 1.96 1.91  CREATININE 2.01* 2.36* 1.64*    Estimated Creatinine Clearance: 38.5 mL/min (A) (by C-G formula based on SCr of 1.64 mg/dL (H)).   Medical History: Past Medical History:  Diagnosis Date  . AICD (automatic cardioverter/defibrillator) present   . Asthma   . AVM (arteriovenous malformation) of colon 07/07/2017  . CHF (congestive heart failure) ([place])   . Diabetes ([place])   . ESRF (end stage renal failure) ([place]) 07/07/2017   pt receiving hemodialysis Monday- Wednesday-friday, sometimes Saturday  . Kidney disease   . LVAD (left ventricular assist device) present ([place]) 12/2016  . Permanent atrial fibrillation ([place]) 07/07/2017  . Presence of permanent cardiac pacemaker    AICD  . Sleep apnea     Assessment: 72 YO M with PMH of CAD s/p CABG x 4 2010, OSA, AS with TAVR 2015, DM2, LVAD, CHF, Afib, ESRD on HD, GI bleed presenting with CP and SOB.   INR this am is within goal range at 1.9, hgb 8.2 yesterday. No bleeding issues noted overnight, LDH stable.   Warfarin PTA dose 5mg  SMFSa, 2.5mg  TWTh. INR today is slightly subtherapeutic at 1.73. No reports of bleeding.   Goal of Therapy:  INR 1.8-2.3 Monitor platelets by anticoagulation protocol: Yes   Plan:  Warfarin 2.5mg  tonight Daily INR for now  Erin Hearing PharmD., BCPS Clinical Pharmacist 06/10/2018 8:52 AM

## 2018-06-11 ENCOUNTER — Inpatient Hospital Stay (HOSPITAL_COMMUNITY): Admit: 2018-06-11 | Payer: Medicare Other

## 2018-06-11 LAB — BASIC METABOLIC PANEL
Anion gap: 9 (ref 5–15)
BUN: 9 mg/dL (ref 8–23)
CHLORIDE: 96 mmol/L — AB (ref 98–111)
CO2: 31 mmol/L (ref 22–32)
CREATININE: 1.78 mg/dL — AB (ref 0.61–1.24)
Calcium: 8.1 mg/dL — ABNORMAL LOW (ref 8.9–10.3)
GFR, EST AFRICAN AMERICAN: 42 mL/min — AB (ref >60)
GFR, EST NON AFRICAN AMERICAN: 36 mL/min — AB (ref >60)
Glucose, Bld: 128 mg/dL — ABNORMAL HIGH (ref 70–99)
POTASSIUM: 3.6 mmol/L (ref 3.5–5.1)
SODIUM: 136 mmol/L (ref 135–145)

## 2018-06-11 LAB — GLUCOSE, CAPILLARY
GLUCOSE-CAPILLARY: 127 mg/dL — AB (ref 70–99)
GLUCOSE-CAPILLARY: 193 mg/dL — AB (ref 70–99)
GLUCOSE-CAPILLARY: 211 mg/dL — AB (ref 70–99)
Glucose-Capillary: 140 mg/dL — ABNORMAL HIGH (ref 70–99)
Glucose-Capillary: 311 mg/dL — ABNORMAL HIGH (ref 70–99)

## 2018-06-11 LAB — CBC
HCT: 27.8 % — ABNORMAL LOW (ref 39.0–52.0)
HEMOGLOBIN: 8.1 g/dL — AB (ref 13.0–17.0)
MCH: 28.1 pg (ref 26.0–34.0)
MCHC: 29.1 g/dL — ABNORMAL LOW (ref 30.0–36.0)
MCV: 96.5 fL (ref 78.0–100.0)
PLATELETS: 185 10*3/uL (ref 150–400)
RBC: 2.88 MIL/uL — AB (ref 4.22–5.81)
RDW: 18.6 % — ABNORMAL HIGH (ref 11.5–15.5)
WBC: 11.7 10*3/uL — ABNORMAL HIGH (ref 4.0–10.5)

## 2018-06-11 LAB — LACTATE DEHYDROGENASE: LDH: 203 U/L — ABNORMAL HIGH (ref 98–192)

## 2018-06-11 LAB — PROTIME-INR
INR: 1.85
PROTHROMBIN TIME: 21.2 s — AB (ref 11.4–15.2)

## 2018-06-11 LAB — PREPARE RBC (CROSSMATCH)

## 2018-06-11 MED ORDER — VANCOMYCIN HCL IN DEXTROSE 750-5 MG/150ML-% IV SOLN
750.0000 mg | INTRAVENOUS | Status: AC
  Administered 2018-06-11: 750 mg via INTRAVENOUS
  Filled 2018-06-11: qty 150

## 2018-06-11 MED ORDER — CHLORHEXIDINE GLUCONATE CLOTH 2 % EX PADS: 6.0000 | MEDICATED_PAD | Freq: Every day | CUTANEOUS | Status: DC

## 2018-06-11 MED ORDER — MAGNESIUM SULFATE 4 GM/100ML IV SOLN
4.0000 g | Freq: Once | INTRAVENOUS | Status: AC
  Administered 2018-06-11: 4 g via INTRAVENOUS
  Filled 2018-06-11: qty 100

## 2018-06-11 MED ORDER — VANCOMYCIN HCL IN DEXTROSE 750-5 MG/150ML-% IV SOLN
INTRAVENOUS | Status: AC
  Filled 2018-06-11: qty 150

## 2018-06-11 MED ORDER — WARFARIN SODIUM 2.5 MG PO TABS
2.5000 mg | ORAL_TABLET | Freq: Once | ORAL | Status: AC
  Administered 2018-06-11: 2.5 mg via ORAL
  Filled 2018-06-11: qty 1

## 2018-06-11 MED ORDER — SODIUM CHLORIDE 0.9% IV SOLUTION: Freq: Once | INTRAVENOUS | Status: AC

## 2018-06-11 MED ORDER — SODIUM CHLORIDE 0.9 % IV SOLN
2.0000 g | Freq: Once | INTRAVENOUS | Status: AC
  Administered 2018-06-11: 2 g via INTRAVENOUS
  Filled 2018-06-11: qty 2

## 2018-06-11 NOTE — Progress Notes (Signed)
LVAD Coordinator Rounding Note:  HM II LVAD implanted on 12/13/16 by Dr. Darcey Nora under Destination Therapy criteria due to age excluding heart transplantation.  Lying in bed, sleeping.   Vital signs: Temp:  98.9 HR: 97 A-fib Automatic BP:  115/85 (91) Doppler:  98 O2 Sat: 97% RA Wt: 169>171>168>165 lbs  LVAD interrogation reveals:  Speed:  9200 Flow:  4.2 Power: 5.1 w PI: 6.5 Alarms: none Events: none Fixed speed: 9200 Low speed limit: 8600   Drive Line:  Right abd dressing CDI. Next dressing change due 06/16/18. Bedside nurse may change dressing.   Labs:  LDH trend:  251>217>205  INR trend: 1.73>1.96>1.91  Anticoagulation Plan: - INR Goal: 1.8-2.3 - ASA Dose: none due to hx of GI bleed - OP monthly Octreotide for hx of GI bleed  Blood Products:  None this admission  Device: - Medtronic single lead -Therapies: on 231 bpm  Arrythmias: chronic afib  Renal:  - chronic HD on M/W/F/Sat at [facility]  Adverse Events on VAD: - 02/2017> GIB- AVM clipped x2 - 07/2017> renal failure- HD started - 11/2017>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 Margins not clear at neck. - 02/23/18> hospitalization for GI bleed. Colon/EGD 02/27/18 with normal EGD. Colonoscopy identified active bleeding in ileum; source not found. Capsule Endoscopy 02/27/18: Showed active bleeding in the ileum. Received 5 units blood with histamine reaction; will give benadryl as OP. - 03/10/18>transferrred to [facility] for double retro balloon scop and sent to IR emolization x 3 with no evidence of bleeding. Received 12 units PCs, remained off anticoagulants. Transferred back to [facility] 03/20/18 Seen by IR 5/20. CTA obtained, but no active bleeding found. Started on octreotide. 03/25/18: Repeat capsule endoscopy completed. Bleeding appears to come from the mid-ileum. 03/26/18: General surgery reconsulted. Recommended transfer back to [facility] for surgery with intraoperative  endoscopy. [name] currently discussing options. Ortho consulted 5/24 for left knee pain. S/p knee aspiration and steroid injection.  Dose of premarin given 5/24.  On 5/25 developed abdominal bloating and distension with n/v. KUB suggestive of ileus versus early SBO. NGT placed for decompression.  Had large bloody BM after this and felt much better, diet now advanced to regular.  - 04/01/18 transferred to [facility] for  laparoscopic surgical enteroscopy for recurrent GI bleed   Plan/Recommendations: 1.  Weekly dressing changes per VAD coordinator. Next dressing change due 06/16/18. 2.  Call VAD pager if any questions re: VAD equipment or drive line site issues.   [name] RN, VAD Coordinator 24/7 VAD pager: [phone]

## 2018-06-11 NOTE — Progress Notes (Signed)
[facility] Progress Note  Subjective: 2.4L off on HD yest, pt feeling better, SOB and nausea gone, leg swelling not gone but better.    Vitals:   06/10/18 1954 06/11/18 0005 06/11/18 0425 06/11/18 0810  BP:  (!) 87/66 90/75 108/90  Pulse: 89 84 84   Resp: 16 15 14 18   Temp:  98.6 F (37 C) 98.9 F (37.2 C) 98.5 F (36.9 C)  TempSrc:  Oral Oral Oral  SpO2: 96% 96% 96% 97%  Weight:   73.5 kg   Height:        Inpatient medications: . atorvastatin  40 mg Oral q1800  . budesonide  0.25 mg Nebulization BID  . busPIRone  5 mg Oral BID  . calcitRIOL  0.5 mcg Oral Q M,W,F-HD  . Chlorhexidine Gluconate Cloth  6 each Topical Q0600  . Chlorhexidine Gluconate Cloth  6 each Topical Q0600  . docusate sodium  200 mg Oral Daily  . insulin aspart  0-15 Units Subcutaneous TID WC  . levothyroxine  25 mcg Oral QAC breakfast  . megestrol  40 mg Oral Daily  . multivitamin  1 tablet Oral QHS  . pantoprazole  40 mg Oral BID  . senna-docusate  1 tablet Oral BID  . sildenafil  20 mg Oral TID  . traZODone  25 mg Oral QHS  . warfarin  2.5 mg Oral ONCE-1800  . Warfarin - Pharmacist Dosing Inpatient   Does not apply q1800   . sodium chloride Stopped (06/10/18 1738)  . ceFEPime (MAXIPIME) IV Stopped (06/10/18 1821)  . ceFEPime (MAXIPIME) IV    . vancomycin    . vancomycin     sodium chloride, acetaminophen, albuterol, benzonatate, LORazepam, ondansetron (ZOFRAN) IV, polyethylene glycol, traMADol  Iron/TIBC/Ferritin/ %Sat    Component Value Date/Time   IRON 125 06/14/2017 1605   TIBC 221 (L) 06/14/2017 1605   FERRITIN 622 (H) 06/14/2017 1605   IRONPCTSAT 57 (H) 06/14/2017 1605    Exam: Gen alert, not in distress, calm No jvd or bruits Chest clear bilat to bases Cor  LVAD hum Abd soft ntnd no mass or ascites +bsy Ext still has 2+ bilat pitting LE edema Neuro is alert, Ox 3 , nf    Home meds:  - lipitor/ buspirone/ flovent hfa/ synthroid/ mvi/ PPI  - sildenafil 20  mg tid/ coumadin hs  - trazodone 25 mg qhs  Dialysis: MWFSat [facility]  4.5h   76.5kg   LUA AVF   Hep none  - last Hb 8.4, tsat 21% ferr 2105  - venofer 100 mg / hd, had 5 of 10  - mircera 225 last on 7/22, due next week  - calc 0.5 ug tiw     Impression: 1  Vol overload/ edema/ SOB : CXR w/o gross edema. LE edema prominent on admission, improving now. Down 3kg under prior dry wt, still w/ room for further reduction.  Plan extra HD today UF 2- 2.5 L NET.   2  ESRD - MWFSat dialysis.  Have d/w primary nephrologist, patient not a PD candidate  3  ICM/ sp LVAD 4  CAD hx CABG 5  AS sp TAVRS 6  DM2 7  Nutrition - requesting a regular diet, will change 8  Dispo - per primary team   Plan - as above   Kelly Splinter MD [place] pager [phone]   06/11/2018, 11:34 AM   Recent Labs  Lab 06/10/18 0737 06/11/18 0236  NA 135 136  K 3.9  3.6  CL 94* 96*  CO2 29 31  GLUCOSE 160* 128*  BUN 8 9  CREATININE 1.64* 1.78*  CALCIUM 8.3* 8.1*  INR 1.91 1.85   No results for input(s): AST, ALT, ALKPHOS, BILITOT, PROT in the last 168 hours. Recent Labs  Lab 06/10/18 0737 06/11/18 0236  WBC 14.3* 11.7*  HGB 8.5* 8.1*  HCT 29.0* 27.8*  MCV 96.3 96.5  PLT 203 185

## 2018-06-11 NOTE — Progress Notes (Addendum)
Advanced Heart Failure VAD Team Note  PCP-Cardiologist: No primary care provider on file.   Subjective:    Had HD again yesterday with -2.4 L off. Weight is up 2 lbs.   Hemoglobin 8.2. INR 1.8  WBC improving 11.7. No fever/chills. Still on Vanc/Cefepime  Feeling much better this morning. Was able to sleep lying flat. Has not done much walking yet, but plans to today. Understands PD is not an option. He would like to get HD before discharge today. No nausea. No bleeding  LVAD INTERROGATION:  HeartMate II LVAD:   Flow 5.6  liters/min, speed 9200, power 6, PI 3.4. No PI events/24 hours.      Objective:    Vital Signs:   Temp:  [97.6 F (36.4 C)-98.9 F (37.2 C)] 98.9 F (37.2 C) (08/08 0425) Pulse Rate:  [42-110] 84 (08/08 0425) Resp:  [14-25] 14 (08/08 0425) BP: (62-122)/(25-86) 90/75 (08/08 0425) SpO2:  [96 %] 96 % (08/08 0425) Weight:  [72.2 kg-74.4 kg] 73.5 kg (08/08 0425) Last BM Date: 06/10/18 Mean arterial Pressure 70-80s   Intake/Output:   Intake/Output Summary (Last 24 hours) at 06/11/2018 0730 Last data filed at 06/10/2018 1900 Gross per 24 hour  Intake 970.11 ml  Output 2421 ml  Net -1450.89 ml     Physical Exam    General:  NAD.  HEENT: Normal. Neck: Supple, JVP 10-12 cm. Carotids OK.  Cardiac:  Mechanical heart sounds with LVAD hum present.  Lungs:  CTAB, normal effort.  Abdomen:  NT, ND, no HSM. No bruits or masses. +BS  LVAD exit site: Dressing dry and intact. No erythema or drainage. Stabilization device present and accurately applied.  Extremities:  Warm and dry. No cyanosis, clubbing, rash, BLE 1-2 + edema. Scab to left knee Neuro:  Alert & oriented x 3. Cranial nerves grossly intact. Moves all 4 extremities w/o difficulty. Affect pleasant     Telemetry   Afib 80-90s. Personally reviewed.   EKG    06/10/18: Afib 96, RBBB, QTc 566 ms. Personally reviewed.   Labs   Basic Metabolic Panel: Recent Labs  Lab 06/08/18 1652 06/09/18 0323  06/10/18 0737 06/11/18 0236  NA 134* 133* 135 136  K 3.6 3.2* 3.9 3.6  CL 95* 95* 94* 96*  CO2 27 28 29 31   GLUCOSE 255* 218* 160* 128*  BUN 9 15 8 9   CREATININE 2.01* 2.36* 1.64* 1.78*  CALCIUM 8.3* 7.9* 8.3* 8.1*  MG  --   --  1.6*  --     Liver Function Tests: No results for input(s): AST, ALT, ALKPHOS, BILITOT, PROT, ALBUMIN in the last 168 hours. No results for input(s): LIPASE, AMYLASE in the last 168 hours. No results for input(s): AMMONIA in the last 168 hours.  CBC: Recent Labs  Lab 06/08/18 1652 06/09/18 0323 06/10/18 0737 06/11/18 0236  WBC 23.4* 22.4* 14.3* 11.7*  HGB 8.9* 8.2* 8.5* 8.1*  HCT 29.9* 27.2* 29.0* 27.8*  MCV 96.1 94.4 96.3 96.5  PLT 222 180 203 185    INR: Recent Labs  Lab 06/08/18 1652 06/09/18 0323 06/10/18 0737 06/11/18 0236  INR 1.73 1.96 1.91 1.85    Other results:  EKG:    Imaging   No results found.   Medications:     Scheduled Medications: . atorvastatin  40 mg Oral q1800  . budesonide  0.25 mg Nebulization BID  . busPIRone  5 mg Oral BID  . calcitRIOL  0.5 mcg Oral Q M,W,F-HD  . Chlorhexidine Gluconate Cloth  6 each Topical Q0600  . Chlorhexidine Gluconate Cloth  6 each Topical Q0600  . docusate sodium  200 mg Oral Daily  . insulin aspart  0-15 Units Subcutaneous TID WC  . levothyroxine  25 mcg Oral QAC breakfast  . megestrol  40 mg Oral Daily  . multivitamin  1 tablet Oral QHS  . pantoprazole  40 mg Oral BID  . senna-docusate  1 tablet Oral BID  . sildenafil  20 mg Oral TID  . traZODone  25 mg Oral QHS  . Warfarin - Pharmacist Dosing Inpatient   Does not apply q1800    Infusions: . sodium chloride Stopped (06/10/18 1738)  . ceFEPime (MAXIPIME) IV Stopped (06/10/18 1821)  . vancomycin      PRN Medications: sodium chloride, acetaminophen, albuterol, benzonatate, LORazepam, ondansetron (ZOFRAN) IV, polyethylene glycol, traMADol   Patient Profile     Trevor [name] is a 72 y.o. male with a history  of CAD s/p CABG x 4 2010, OSA, AS with TAVR 2015, CKD, DM2, paroxysmal atrial fibrillation, asthma, ESRD on HD, ischemic cardiomyopathy with Medtronic ICD, and  HMII.   Admitted with A/C systolic heart failure and elevated WBC.    Assessment/Plan:    1. Acute on Chronic systolic CHF: - Ischemic cardiomyopathy, EF 20-25% with RV dysfunction on 2/18 echo pre-LVAD.  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  Medtronic ICD.  He has had problems with RV failure post-op.  - Volume status much improved.  - Nephrology following and took off 2 days in a row. Not a PD candidate. May take off more today prior to DC.  2. ESRD:  - Very poor tolerance of HD, and seems to be getting worse. He states he does not think he can tolerate HD much longer and if not PD candidate may need to re-engage Hospice team to discuss [place].  - Nephrology following. They think he is losing body weight because weights have been at goal outpatient despite volume overload. Not a PD candidate - He tells me today that he wants to try more aggressive fluid removal. He is not counting on PD being an option.   3. VAD: - VAD interrogated personally. Parameters stable. - 8/4. One "No External Power" alarm on VAD in setting of unplugging both batteries at same time. We discussed this.  - Continue warfarin with INR goal 1.8 - 2.3. INR 1.85 - Discussed personally with pharmacy.  - LDH stable 203  4. CAD: s/p CABG - No s/s ischemia - He is on atorvastatin. Off ASA with GI bleed.    5.  Atrial fibrillation: Chronic.   - On warfarin. INR 1.85  6. RV failure:  - Continue Revatio at 20 mg tid.  No change.   7. 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  - Per pt, renal goal is Hgb > 11.0.   - Continue Aranesp. Hemoglobin 8.1 this am. Denies bleeding.   8. GI bleeding: GI AVMs.  - Double balloon retro endoscopy at [facility], unable to identify bleeding lesion. He failed attempted IR  embolization x 3 while at [facility]. 12 units PRBCs at [facility]. Received 15 uPRBC while at [facility]. IR consulted, but did not find any active bleeding on CTA 5/20. Repeat capsule study showed active bleeding in the mid ileum.Surgery was reconsulted and recommended transfer back to [facility] for possible surgery. - Now s/p laparoscopic surgical enteroscopy with 4 clips on 6/7/19at [name], c/b ischemic bowel/SBO.  - Tolerating  warfarin. Goal INR 1.8-2.3, adjust to keep in this range.  - Getting Aranesp per nephrology. No bleeding.   9. Squamous cell skin cancer: - Neck excision site without clear margins.  He saw Dr Lisbeth Renshaw with radiation oncology, radiation was recommended.  He wants to hold off for the time being, does not think he can handle radiation as HD wears him out 4 days/week.  - No change.   10. H/o TAVR: - Stable on last echo. No change.   11. H/o Septic Left Knee - s/p I&D and surgical wash out at [facility] - Had f/u with ortho 06/02/18. Healing nicely.   12. ID:  WBC improving 11.7. Afebrile Blood 8/5 NGTD x 2 days Continue Vanc and Cefepime for now  13. Hypokalemia - K 3.6 this am.   14. NSVT - K 3.6, mag 1.6 yesterday. Will supp. No further NSVT.  He can discharge after HD today. He gets INR checked in VAD clinic. Will arrange follow up.   I reviewed the LVAD parameters from today, and compared the results to the patient's prior recorded data.  No programming changes were made.  The LVAD is functioning within specified parameters.  The patient performs LVAD self-test daily.  LVAD interrogation was negative for any significant power changes, alarms or PI events/speed drops.  LVAD equipment check completed and is in good working order.  Back-up equipment present.   LVAD education done on emergency procedures and precautions and reviewed exit site care.  Length of Stay: [place], NP 06/11/2018, 7:30 AM  VAD Team --- VAD ISSUES ONLY--- Pager [phone] (7am - 7am)  Advanced Heart  Failure Team  Pager [phone] (M-F; 7a - 4p)  Please contact [facility] for night-coverage after hours (4p -7a ) and weekends on [email or web address]  Patient seen with NP, agree with the above note. He was volume overloaded on exam when admitted and has been more short of breath with cough recently. I suspect that his dry weight needs to be decreased. He likely lost a fair amount of muscle mass during recent prolonged hospitalization. Dyspnea and cough improving with HD.  - He had HD again yesterday, weight coming down.  Still some volume overload on exam => HD today again.  - PD not an option per nephrology.   Looking longer term, he has been having trouble with HD. Generally does not feel good on HD days. We talked about when he will have had "enough." He says that he is still doing well enough that he would be willing to try to get more fluid dialyzed off if the only other option would be hospice. He will tell me when he thinks that he cannot tolerate HD any longer.   After HD today, he could go home as long as it is not too late.  He would be scheduled for regular HD again tomorrow.  Will let renal direct [place] as to whether he should go to HD again tomorrow or not.   Loralie Champagne 06/11/2018 11:56 AM

## 2018-06-11 NOTE — Progress Notes (Addendum)
ANTICOAGULATION CONSULT NOTE Pharmacy Consult for Warfarin Indication: LVAD  No Known Allergies  Patient Measurements: Height: 5\' 5"  (165.1 cm) Weight: 162 lb (73.5 kg) IBW/kg (Calculated) : 61.5  Vital Signs: Temp: 98.9 F (37.2 C) (08/08 0425) Temp Source: Oral (08/08 0425) BP: 90/75 (08/08 0425) Pulse Rate: 84 (08/08 0425)  Labs: Recent Labs    06/09/18 0323 06/10/18 0737 06/11/18 0236  HGB 8.2* 8.5* 8.1*  HCT 27.2* 29.0* 27.8*  PLT 180 203 185  LABPROT 22.2* 21.8* 21.2*  INR 1.96 1.91 1.85  CREATININE 2.36* 1.64* 1.78*    Estimated Creatinine Clearance: 32.6 mL/min (A) (by C-G formula based on SCr of 1.78 mg/dL (H)).   Medical History: Past Medical History:  Diagnosis Date  . AICD (automatic cardioverter/defibrillator) present   . Asthma   . AVM (arteriovenous malformation) of colon 07/07/2017  . CHF (congestive heart failure) ([place])   . Diabetes ([place])   . ESRF (end stage renal failure) ([place]) 07/07/2017   pt receiving hemodialysis Monday- Wednesday-friday, sometimes Saturday  . Kidney disease   . LVAD (left ventricular assist device) present ([place]) 12/2016  . Permanent atrial fibrillation ([place]) 07/07/2017  . Presence of permanent cardiac pacemaker    AICD  . Sleep apnea     Assessment: 72 YO M with PMH of CAD s/p CABG x 4 2010, OSA, AS with TAVR 2015, DM2, LVAD, CHF, Afib, ESRD on HD, GI bleed presenting with CP and SOB.   INR this am continues to be in goal range at 1.8, cbc has been stable. No bleeding issues noted overnight, LDH stable. Will give higher dose warfarin tonight.   Warfarin PTA dose 5mg  SMFSa, 2.5mg  TWTh. INR today is slightly subtherapeutic at 1.73. No reports of bleeding.   Bacteremia: started on vancomycin and cefepime on admission. He continues to be afebrile with wbc trending down to 11. No growth in cultures. Has been receiving extra HD so supplemental abx doses received.   Goal of Therapy:  INR 1.8-2.3 Monitor platelets by  anticoagulation protocol: Yes   Plan:  Planning discharge after HD today ->continue home dose of warfarin as he has been very stable in house Can check INR with post hospital follow up in VAD clinic  Erin Hearing PharmD., BCPS Clinical Pharmacist 06/11/2018 7:54 AM

## 2018-06-11 NOTE — Discharge Summary (Addendum)
Advanced Heart Failure Team  Discharge Summary   Patient ID: Trevor [name] MRN: [id number], DOB/AGE: [birth year] 72 y.o. Admit date: 06/08/2018 D/C date:     06/12/2018   Primary Discharge Diagnoses:  1. A/C systolic HF due to ICM - s/p HM2 LVAD 12/2016 - s/p MDT ICD 3. ESRD 3. VAD - HM2 - On Coumadin with INR goal 1.8-2.3. No ASA - Speed 9200 4. CAD s/p CABG 5. Chronic Afib 6. RV failure 7. Anemia 8. Hx of GI bleed 9. Hx of squamous cell skin ca 10. Hx of TAVR 11. Hx of septic knee 12. Leukocytosis 13. Hypokalemia 14. NSVT  Hospital Course:  Trevor Voit Murphyis a 72 y.o.malewith a history of CAD s/p CABG x 4 2010, OSA, AS with TAVR 2015, CKD, DM2, paroxysmal atrial fibrillation, asthma, ESRD on HD, ischemic cardiomyopathy with Medtronic ICD, and  HMII.   Presented to [facility] with severe weakness, nausea, and SOB after HD. Found to be volume overloaded. He got HD 3 days in a row and symptoms improved. Nephro discussed possibility of PD, but he is not a candidate due to recent abdominal surgery. Goal weight will be adjusted for further HD. He wants to pursue more aggressive volume removal at this time with HD (vs hospice). WBC noted to be 23k on admission. Blood cultures were negative. Treated with IV vanc and cefepime and WBC improved. He remained afebrile. VAD parameters were stable. He got 1 unit PRBCs for hemoglobin 8.1 (unable to get blood transfusions at his normal HD chair). Problem based review of admission is below.  1.Acute onChronic systolic CHF: - Ischemic cardiomyopathy, EF 20-25% with RV dysfunction on 2/18 echo pre-LVAD. 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 Medtronic ICD. He has had problems with RV failure post-op.  - Volume status much improved.  - Nephrology following and took off 2 days in a row. Not a PD candidate. May take off more today prior to DC. 2. ESRD:  -Verypoor tolerance of HD, and seems to be getting worse. He states he does not think he can tolerate HD much  longer and if not PD candidate may need to re-engage Hospice team to discuss [place].  - He is not a PD candidate.  - Nephrology think he is losing body weight because weights have been at goal outpatient despite volume overload. They will adjust for outpatient.  - He tells me today that he wants to try more aggressive fluid removal. He is not counting on PD being an option.  3. VAD: HM2 -VAD interrogated personally. Parameters remained stable. - 8/4. One "No External Power" alarm on VAD in setting of unplugging both batteries at same time. We discussed this. - Continue warfarin with INR goal 1.8 - 2.3. Dosing per pharmacy.  - LDH remained stable.  4. CAD: s/p CABG -No s/s ischemia - Continue atorvastatin.Off ASA with GI bleed.  5.Atrial fibrillation: Chronic.  - On warfarin. INR monitored and adjusted by pharamcy 6.RV failure:  - Continue Revatio at 20 mg tid. 7. 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  - Per pt, renal goal is Hgb > 11.0. - Continue Aranesp. CBC monitored. Hemoglobin remained stable 8.1-8.5. Received 1 unit PRBC. 8. GI bleeding: GI AVMs.  - Double balloon retro endoscopy at [facility], unable to identify bleeding lesion. He failed attempted IR embolization x 3 while at [facility]. 12 units PRBCs at [facility]. Received 15 uPRBC while at [facility]. IR consulted, but did not find any  active bleeding on CTA 5/20. Repeat capsule study showed active bleeding in the mid ileum.Surgery was reconsulted and recommended transfer back to [facility] for possible surgery. - Now s/p laparoscopic surgical enteroscopy with 4 clips on 6/7/19at [name], c/b ischemic bowel/SBO.  - Tolerating warfarin. Goal INR 1.8-2.3, adjust to keep in this range. - Getting Aranesp per nephrology.  - No problems with bleeding this admission. CBC monitored and remained stable with hemoglobin 8.1-8.5. Received 1 unit PRBCs for hemoglobin drifting down. 9. Squamous cell  skin cancer: - Neck excision site without clear margins. He saw Dr Lisbeth Renshaw with radiation oncology, radiation was recommended. He wants to hold off for the time being, does not think he can handle radiation as HD wears him out 4 days/week.  10. H/o TAVR: -Stable on last echo.  11.H/oSeptic Left Knee - s/p I&D and surgical wash out at [facility] -Had f/uwith ortho 06/02/18.  12. Leukocytosis: WBC up to 23 on admit, down to 11.7. Received IV vanc and cefepime. Blood cultures were negative. No source found. He remained afebrile.  13. Hypokalemia - Resolved with supplmenetation 14. NSVT - Improved with electrolyte supplementation and HD  LVAD Interrogation HM 2: Speed: 9200 Flow: 4.5 PI: 6.8 Power: 5.0. No PI events   General: NAD.  HEENT: Normal. Neck: Supple, JVP 8-9 cm. Carotids OK.  Cardiac:  Mechanical heart sounds with LVAD hum present.  Lungs:  CTAB, normal effort.  Abdomen:  NT, ND, no HSM. No bruits or masses. +BS  LVAD exit site: Dressing dry and intact. No erythema or drainage. Stabilization device present and accurately applied. Driveline dressing changed daily per sterile technique. Extremities:  Warm and dry. No cyanosis, clubbing, or rash. 1+ edema to knees.  Neuro:  Alert & oriented x 3. Cranial nerves grossly intact. Moves all 4 extremities w/o difficulty. Affect pleasant     He will be followed closely in the VAD clinic, with appointment as below. He will get PT/NIR check at follow up. He will resume his normal HD schedule per nephrology.   Discharge Weight: 162 lbs Discharge Vitals: Blood pressure 109/86, pulse 86, temperature 98.8 F (37.1 C), temperature source Oral, resp. rate 16, height 5\' 5"  (1.651 m), weight 72.7 kg, SpO2 100 %.  Labs: Lab Results  Component Value Date   WBC 11.5 (H) 06/12/2018   HGB 9.6 (L) 06/12/2018   HCT 32.1 (L) 06/12/2018   MCV 94.1 06/12/2018   PLT 173 06/12/2018    Recent Labs  Lab 06/12/18 0342  NA 136  K 3.6  CL 97*  CO2 28   BUN 10  CREATININE 1.83*  CALCIUM 8.4*  GLUCOSE 241*   Lab Results  Component Value Date   CHOL 59 12/04/2016   HDL 30 (L) 12/04/2016   LDLCALC 15 12/04/2016   TRIG 68 12/04/2016   BNP (last 3 results) Recent Labs    05/25/18 1905  BNP 1,084.4*    ProBNP (last 3 results) No results for input(s): PROBNP in the last 8760 hours.   Diagnostic Studies/Procedures   No results found.  Discharge Medications   Allergies as of 06/12/2018   No Known Allergies     Medication List    TAKE these medications   acetaminophen 325 MG tablet Commonly known as:  TYLENOL Take 2 tablets (650 mg total) by mouth every 4 (four) hours as needed for headache or mild pain.   albuterol (2.5 MG/3ML) 0.083% nebulizer solution Commonly known as:  PROVENTIL Take 2.5 mg by nebulization every 4 (  four) hours as needed for wheezing or shortness of breath.   albuterol 108 (90 Base) MCG/ACT inhaler Commonly known as:  PROVENTIL HFA;VENTOLIN HFA Inhale 2 puffs into the lungs every 4 (four) hours as needed for wheezing or shortness of breath.   atorvastatin 40 MG tablet Commonly known as:  LIPITOR TAKE 1 TABLET(40 MG) BY MOUTH DAILY What changed:    how much to take  how to take this  when to take this  additional instructions   benzonatate 100 MG capsule Commonly known as:  TESSALON Take 1 capsule (100 mg total) by mouth 3 (three) times daily as needed for cough.   busPIRone 5 MG tablet Commonly known as:  BUSPAR Take 1 tablet (5 mg total) by mouth 2 (two) times daily.   calcitRIOL 0.5 MCG capsule Commonly known as:  ROCALTROL Take 1 capsule (0.5 mcg total) by mouth every Monday, Wednesday, and Friday with hemodialysis.   Darbepoetin Alfa 200 MCG/0.4ML Sosy injection Commonly known as:  ARANESP Inject 0.4 mLs (200 mcg total) into the vein every Monday with hemodialysis.   docusate sodium 250 MG capsule Commonly known as:  COLACE Take 1 capsule (250 mg total) by mouth daily.    fluticasone 110 MCG/ACT inhaler Commonly known as:  FLOVENT HFA Inhale 2 puffs into the lungs 2 (two) times daily.   hydrocerin Crea Apply 1 application topically 2 (two) times daily. What changed:    when to take this  reasons to take this   insulin glargine 100 UNIT/ML injection Commonly known as:  LANTUS Use 4 units in morning and 6 units at bedtime. What changed:    how much to take  how to take this  when to take this  additional instructions   levothyroxine 25 MCG tablet Commonly known as:  SYNTHROID, LEVOTHROID Take 1 tablet (25 mcg total) by mouth daily before breakfast.   LORazepam 0.5 MG tablet Commonly known as:  ATIVAN Take 1 tablet (0.5 mg total) by mouth every 8 (eight) hours as needed for anxiety (for nausea or anxiety).   megestrol 40 MG tablet Commonly known as:  MEGACE Take 1 tablet (40 mg total) by mouth daily.   multivitamin Tabs tablet Take 1 tablet by mouth at bedtime.   pantoprazole 40 MG tablet Commonly known as:  PROTONIX Take 1 tablet (40 mg total) by mouth 2 (two) times daily.   polyethylene glycol packet Commonly known as:  MIRALAX / GLYCOLAX Take 17 g by mouth daily as needed for mild constipation.   senna-docusate 8.6-50 MG tablet Commonly known as:  Senokot-S Take 1 tablet by mouth 2 (two) times daily. What changed:    when to take this  reasons to take this   sildenafil 20 MG tablet Commonly known as:  REVATIO Take 1 tablet (20 mg total) by mouth 3 (three) times daily.   traZODone 50 MG tablet Commonly known as:  DESYREL Take 0.5 tablets (25 mg total) by mouth at bedtime.   warfarin 5 MG tablet Commonly known as:  COUMADIN Take as directed. If you are unsure how to take this medication, talk to your nurse or doctor. Original instructions:  Take 2.5-5 mg by mouth See admin instructions. Take 1/2 tablet (2.5 mg) on Tuesday, Wednesday, Thursday nights, take 1 tablet (5 mg) on Sunday, Monday, Friday, Saturday  nights What changed:  Another medication with the same name was removed. Continue taking this medication, and follow the directions you see here.       Disposition  The patient will be discharged in stable condition to home. Discharge Instructions    (HEART FAILURE PATIENTS) Call MD:  Anytime you have any of the following symptoms: 1) 3 pound weight gain in 24 hours or 5 pounds in 1 week 2) shortness of breath, with or without a dry hacking cough 3) swelling in the hands, feet or stomach 4) if you have to sleep on extra pillows at night in order to breathe.   Complete by:  As directed    Call MD for:  persistant dizziness or light-headedness   Complete by:  As directed    Call MD for:  persistant nausea and vomiting   Complete by:  As directed    Call MD for:  redness, tenderness, or signs of infection (pain, swelling, redness, odor or green/yellow discharge around incision site)   Complete by:  As directed    Diet - low sodium heart healthy   Complete by:  As directed    Heart Failure patients record your daily weight using the same scale at the same time of day   Complete by:  As directed    INR  Goal: 1.8 - 2.3   Complete by:  As directed    Goal:  1.8 - 2.3   Increase activity slowly   Complete by:  As directed    Page VAD Coordinator at [phone]  Notify for: any VAD alarms, sustained elevations of power >10 watts, sustained drop in Pulse Index <3   Complete by:  As directed    Notify for:   any VAD alarms sustained elevations of power >10 watts sustained drop in Pulse Index <3     STOP any activity that causes chest pain, shortness of breath, dizziness, sweating, or exessive weakness   Complete by:  As directed    Speed Settings:   Complete by:  As directed    Fixed 9200 RPM Low 8600 RPM     Follow-up Information    [facility] [place] Follow up on 06/18/2018.   Specialty:  Cardiology Why:  VAD follow up. 11 am. Garage code:  [id number]. Contact information: [address] [id number] [place] [phone]            Duration of Discharge Encounter: Greater than 35 minutes   Signed, Annamaria Helling  06/12/2018, 8:54 AM  Patient seen with PA, agree with the above note.  Cough improved and dyspnea improved with increased UF at HD and lower dry weight.    Think this presentation was related to CHF, dry weight too high with loss of lean body mass. Would continue aiming for lower dry weight.   Suspect no infection, stop antibiotics.   He may go home today, resume regular HD scheduled tomorrow.   Loralie Champagne 06/12/2018 9:26 AM

## 2018-06-11 NOTE — Progress Notes (Signed)
LVAD Coordinator Rounding Note:  HM II LVAD implanted on 12/13/16 by Dr. Darcey Nora under Destination Therapy criteria due to age excluding heart transplantation.  Pt awake, sitting on side of bed. Reports he feels much better today. PRN Ativan has helped with his nausea, just finished 80% of his breakfast tray. Also reports he feels strong enough today to try and walk. Per nephrology, he will have dialysis later today, see how he does afterward, and possible discharge home tomorrow if VAD team agrees.  Vital signs: Temp:  98.0 HR: 90 A-fib Automatic BP:  Doppler:  O2 Sat: 100% RA Wt: 169>171>168>165>161.7 lbs  LVAD interrogation reveals:  Speed:  9200 Flow:  4.9 Power: 5.5 w PI: 5.8 Alarms: none Events: none Fixed speed: 9200 Low speed limit: 8600   Drive Line:  Right abd dressing CDI. Next dressing change due 06/16/18. Bedside nurse may change dressing.   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 Margins not clear at neck. - 02/23/18> hospitalization for GI bleed. Colon/EGD 02/27/18 with normal EGD. Colonoscopy identified active bleeding in ileum; source not found. Capsule Endoscopy 02/27/18: Showed active bleeding in the ileum. Received 5 units blood with histamine reaction; will give benadryl as OP. - 03/10/18>transferrred to [facility] for double retro balloon scop and sent to IR emolization x 3 with no evidence of bleeding. Received 12 units PCs, remained off  anticoagulants. Transferred back to [facility] 03/20/18 Seen by IR 5/20. CTA obtained, but no active bleeding found. Started on octreotide. 03/25/18: Repeat capsule endoscopy completed. Bleeding appears to come from the mid-ileum. 03/26/18: General surgery reconsulted. Recommended transfer back to [facility] for surgery with intraoperative endoscopy. [name] currently discussing options. Ortho consulted 5/24 for left knee pain. S/p knee aspiration and steroid injection.  Dose of premarin given 5/24.  On 5/25 developed abdominal bloating and distension with n/v. KUB suggestive of ileus versus early SBO. NGT placed for decompression.  Had large bloody BM after this and felt much better, diet now advanced to regular.  - 04/01/18 transferred to [facility] for  laparoscopic surgical enteroscopy for recurrent GI bleed   Plan/Recommendations: 1.  Weekly dressing changes per VAD coordinator. Next dressing change due 06/16/18. 2.  Call VAD pager if any questions re: VAD equipment or drive line site issues.   [name] RN, VAD Coordinator 24/7 VAD pager: [phone]

## 2018-06-12 ENCOUNTER — Telehealth: Payer: Self-pay | Admitting: *Deleted

## 2018-06-12 LAB — BPAM RBC
BLOOD PRODUCT EXPIRATION DATE: 201908252359
ISSUE DATE / TIME: 201908081651
Unit Type and Rh: 600

## 2018-06-12 LAB — TYPE AND SCREEN
ABO/RH(D): A NEG
Antibody Screen: NEGATIVE
Unit division: 0

## 2018-06-12 LAB — BASIC METABOLIC PANEL
Anion gap: 11 (ref 5–15)
BUN: 10 mg/dL (ref 8–23)
CHLORIDE: 97 mmol/L — AB (ref 98–111)
CO2: 28 mmol/L (ref 22–32)
Calcium: 8.4 mg/dL — ABNORMAL LOW (ref 8.9–10.3)
Creatinine, Ser: 1.83 mg/dL — ABNORMAL HIGH (ref 0.61–1.24)
GFR calc Af Amer: 41 mL/min — ABNORMAL LOW (ref >60)
GFR calc non Af Amer: 35 mL/min — ABNORMAL LOW (ref >60)
GLUCOSE: 241 mg/dL — AB (ref 70–99)
POTASSIUM: 3.6 mmol/L (ref 3.5–5.1)
SODIUM: 136 mmol/L (ref 135–145)

## 2018-06-12 LAB — CBC
HCT: 32.1 % — ABNORMAL LOW (ref 39.0–52.0)
HEMOGLOBIN: 9.6 g/dL — AB (ref 13.0–17.0)
MCH: 28.2 pg (ref 26.0–34.0)
MCHC: 29.9 g/dL — ABNORMAL LOW (ref 30.0–36.0)
MCV: 94.1 fL (ref 78.0–100.0)
Platelets: 173 10*3/uL (ref 150–400)
RBC: 3.41 MIL/uL — AB (ref 4.22–5.81)
RDW: 19.1 % — ABNORMAL HIGH (ref 11.5–15.5)
WBC: 11.5 10*3/uL — ABNORMAL HIGH (ref 4.0–10.5)

## 2018-06-12 LAB — GLUCOSE, CAPILLARY: GLUCOSE-CAPILLARY: 178 mg/dL — AB (ref 70–99)

## 2018-06-12 LAB — LACTATE DEHYDROGENASE: LDH: 210 U/L — ABNORMAL HIGH (ref 98–192)

## 2018-06-12 LAB — PROTIME-INR
INR: 1.65
Prothrombin Time: 19.4 seconds — ABNORMAL HIGH (ref 11.4–15.2)

## 2018-06-12 MED ORDER — BENZONATATE 100 MG PO CAPS: 100.0000 mg | ORAL_CAPSULE | Freq: Three times a day (TID) | ORAL | 0 refills | Status: DC | PRN

## 2018-06-12 MED ORDER — MEGESTROL ACETATE 40 MG PO TABS: 40.0000 mg | ORAL_TABLET | Freq: Every day | ORAL | 5 refills | Status: AC

## 2018-06-12 MED ORDER — LORAZEPAM 0.5 MG PO TABS: 0.5000 mg | ORAL_TABLET | Freq: Three times a day (TID) | ORAL | 0 refills | Status: DC | PRN

## 2018-06-12 NOTE — Progress Notes (Signed)
Inpatient Diabetes Program Recommendations  AACE/ADA: New Consensus Statement on Inpatient Glycemic Control (2019)  Target Ranges:  Prepandial:   less than 140 mg/dL      Peak postprandial:   less than 180 mg/dL (1-2 hours)      Critically ill patients:  140 - 180 mg/dL  Results for Trevor [name], Trevor [name] (MRN [id number]) as of 06/12/2018 07:38  Ref. Range 06/12/2018 03:42  Glucose Latest Ref Range: 70 - 99 mg/dL 241 (H)   Results for Trevor [name], Trevor [name] (MRN [id number]) as of 06/12/2018 07:38  Ref. Range 06/11/2018 08:08 06/11/2018 12:30 06/11/2018 14:42 06/11/2018 18:43 06/11/2018 22:08  Glucose-Capillary Latest Ref Range: 70 - 99 mg/dL 127 (H) 193 (H) 140 (H) 211 (H) 311 (H)   Review of Glycemic Control  Diabetes history: DM2 Outpatient Diabetes medications: Lantus 5 units BID Current orders for Inpatient glycemic control: Novolog 0-15 units TID with meals  Inpatient Diabetes Program Recommendations:   Correction (SSI): Please consider ordering Novolog 0-5 units QHS for bedtime correction. Diet: Please consider discontinuing current diet and order Renal Carb Modified.  Insulin - Meal Coverage: If diet is changed as recommended and if post prandial glucose continues to be consistently elevated, please consider ordering Novolog 2 units TID with meals for meal coverage if patient eats at least 50% of meals.  Thanks, Barnie Alderman, RN, MSN, CDE Diabetes Coordinator Inpatient Diabetes Program [phone] (Team Pager from 8am to 5pm)

## 2018-06-12 NOTE — Care Management Note (Signed)
Case Management Note  Patient Details  Name: Trevor [name] MRN: [id number] Date of Birth: [birth year]  Subjective/Objective:   Patient for dc today, he is active with [facility] for [facility] , HHPT, will resume at discharge. Butch Penny with [facility] aware of patient discharge today.                    Action/Plan: DC home with resumption of [place] services.   Expected Discharge Date:  06/11/18               Expected Discharge Plan:  [place]  In-House Referral:     Discharge planning Services  CM Consult  Post Acute Care Choice:  Resumption of Svcs/PTA Provider Choice offered to:     DME Arranged:    DME Agency:  [place] Arranged:  RN, PT [facility] Agency:     Status of Service:  Completed, signed off  If discussed at [place] of Stay Meetings, dates discussed:    Additional Comments:  Zenon Mayo, RN 06/12/2018, 9:57 AM

## 2018-06-12 NOTE — Care Management Important Message (Signed)
Important Message  Patient Details  Name: Trevor [name] MRN: [id number] Date of Birth: 09-Jun-1946   Medicare Important Message Given:  Yes    Zenon Mayo, RN 06/12/2018, 9:11 AM

## 2018-06-12 NOTE — Telephone Encounter (Signed)
Pt was on TCM report admitted 06/08/18 for severe weakness, nausea, and SOB after HD. Found to be volume overloaded. He got HD 3 days in a row and symptoms improved. Acute onChronic systolic CHF: - Ischemic cardiomyopathy, EF 20-25% with RV dysfunction on 2/18 echo pre-LVAD. 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 Medtronic ICD. He has had problems with RV failure post-op. Pt D/C 06/12/18, and will f/u w/ VAD clinic 06/18/18.Marland KitchenJohny Chess

## 2018-06-13 LAB — CULTURE, BLOOD (ROUTINE X 2)
Culture: NO GROWTH
Culture: NO GROWTH

## 2018-06-18 ENCOUNTER — Ambulatory Visit (HOSPITAL_COMMUNITY): Payer: Self-pay | Admitting: Pharmacist

## 2018-06-18 ENCOUNTER — Other Ambulatory Visit (HOSPITAL_COMMUNITY): Payer: Self-pay | Admitting: *Deleted

## 2018-06-18 ENCOUNTER — Ambulatory Visit (HOSPITAL_COMMUNITY)
Admission: RE | Admit: 2018-06-18 | Discharge: 2018-06-18 | Disposition: A | Discharge status: Home / Self Care | Payer: Medicare Other | Source: Ambulatory Visit | Attending: Internal Medicine | Admitting: Internal Medicine

## 2018-06-18 ENCOUNTER — Encounter (HOSPITAL_COMMUNITY): Payer: Self-pay

## 2018-06-18 VITALS — BP 115/76 | HR 84 | Ht 65.0 in | Wt 165.8 lb

## 2018-06-18 DIAGNOSIS — Z95811 Presence of heart assist device: Secondary | ICD-10-CM

## 2018-06-18 DIAGNOSIS — I2510 Atherosclerotic heart disease of native coronary artery without angina pectoris: Secondary | ICD-10-CM | POA: Diagnosis not present

## 2018-06-18 DIAGNOSIS — N186 End stage renal disease: Secondary | ICD-10-CM | POA: Diagnosis not present

## 2018-06-18 DIAGNOSIS — Z8249 Family history of ischemic heart disease and other diseases of the circulatory system: Secondary | ICD-10-CM | POA: Insufficient documentation

## 2018-06-18 DIAGNOSIS — Z951 Presence of aortocoronary bypass graft: Secondary | ICD-10-CM | POA: Insufficient documentation

## 2018-06-18 DIAGNOSIS — I5043 Acute on chronic combined systolic (congestive) and diastolic (congestive) heart failure: Secondary | ICD-10-CM

## 2018-06-18 DIAGNOSIS — I132 Hypertensive heart and chronic kidney disease with heart failure and with stage 5 chronic kidney disease, or end stage renal disease: Secondary | ICD-10-CM | POA: Diagnosis not present

## 2018-06-18 DIAGNOSIS — Z833 Family history of diabetes mellitus: Secondary | ICD-10-CM | POA: Insufficient documentation

## 2018-06-18 DIAGNOSIS — Z09 Encounter for follow-up examination after completed treatment for conditions other than malignant neoplasm: Secondary | ICD-10-CM | POA: Diagnosis not present

## 2018-06-18 DIAGNOSIS — Z9889 Other specified postprocedural states: Secondary | ICD-10-CM | POA: Insufficient documentation

## 2018-06-18 DIAGNOSIS — Z792 Long term (current) use of antibiotics: Secondary | ICD-10-CM | POA: Diagnosis not present

## 2018-06-18 DIAGNOSIS — K5521 Angiodysplasia of colon with hemorrhage: Secondary | ICD-10-CM | POA: Diagnosis not present

## 2018-06-18 DIAGNOSIS — Z7901 Long term (current) use of anticoagulants: Secondary | ICD-10-CM

## 2018-06-18 DIAGNOSIS — Z794 Long term (current) use of insulin: Secondary | ICD-10-CM | POA: Diagnosis not present

## 2018-06-18 DIAGNOSIS — M009 Pyogenic arthritis, unspecified: Secondary | ICD-10-CM | POA: Diagnosis not present

## 2018-06-18 DIAGNOSIS — C4492 Squamous cell carcinoma of skin, unspecified: Secondary | ICD-10-CM | POA: Insufficient documentation

## 2018-06-18 DIAGNOSIS — G4733 Obstructive sleep apnea (adult) (pediatric): Secondary | ICD-10-CM | POA: Insufficient documentation

## 2018-06-18 DIAGNOSIS — K284 Chronic or unspecified gastrojejunal ulcer with hemorrhage: Secondary | ICD-10-CM

## 2018-06-18 DIAGNOSIS — I255 Ischemic cardiomyopathy: Secondary | ICD-10-CM | POA: Diagnosis not present

## 2018-06-18 DIAGNOSIS — E1122 Type 2 diabetes mellitus with diabetic chronic kidney disease: Secondary | ICD-10-CM | POA: Insufficient documentation

## 2018-06-18 DIAGNOSIS — Z79899 Other long term (current) drug therapy: Secondary | ICD-10-CM | POA: Insufficient documentation

## 2018-06-18 DIAGNOSIS — N179 Acute kidney failure, unspecified: Secondary | ICD-10-CM | POA: Diagnosis not present

## 2018-06-18 DIAGNOSIS — I5022 Chronic systolic (congestive) heart failure: Secondary | ICD-10-CM

## 2018-06-18 DIAGNOSIS — Z79891 Long term (current) use of opiate analgesic: Secondary | ICD-10-CM | POA: Diagnosis not present

## 2018-06-18 DIAGNOSIS — Z952 Presence of prosthetic heart valve: Secondary | ICD-10-CM

## 2018-06-18 LAB — CBC
HCT: 33.9 % — ABNORMAL LOW (ref 39.0–52.0)
Hemoglobin: 10.1 g/dL — ABNORMAL LOW (ref 13.0–17.0)
MCH: 28.3 pg (ref 26.0–34.0)
MCHC: 29.8 g/dL — ABNORMAL LOW (ref 30.0–36.0)
MCV: 95 fL (ref 78.0–100.0)
Platelets: 169 10*3/uL (ref 150–400)
RBC: 3.57 MIL/uL — AB (ref 4.22–5.81)
RDW: 18.8 % — AB (ref 11.5–15.5)
WBC: 13.8 10*3/uL — AB (ref 4.0–10.5)

## 2018-06-18 LAB — BASIC METABOLIC PANEL
ANION GAP: 10 (ref 5–15)
BUN: 26 mg/dL — ABNORMAL HIGH (ref 8–23)
CALCIUM: 9 mg/dL (ref 8.9–10.3)
CO2: 28 mmol/L (ref 22–32)
Chloride: 98 mmol/L (ref 98–111)
Creatinine, Ser: 2.46 mg/dL — ABNORMAL HIGH (ref 0.61–1.24)
GFR calc Af Amer: 29 mL/min — ABNORMAL LOW (ref >60)
GFR calc non Af Amer: 25 mL/min — ABNORMAL LOW (ref >60)
Glucose, Bld: 178 mg/dL — ABNORMAL HIGH (ref 70–99)
POTASSIUM: 3.8 mmol/L (ref 3.5–5.1)
SODIUM: 136 mmol/L (ref 135–145)

## 2018-06-18 LAB — PROTIME-INR
INR: 1.32
PROTHROMBIN TIME: 16.2 s — AB (ref 11.4–15.2)

## 2018-06-18 LAB — LACTATE DEHYDROGENASE: LDH: 237 U/L — ABNORMAL HIGH (ref 98–192)

## 2018-06-18 MED ORDER — LORAZEPAM 0.5 MG PO TABS: 0.5000 mg | ORAL_TABLET | Freq: Three times a day (TID) | ORAL | 0 refills | Status: DC | PRN

## 2018-06-18 MED ORDER — PANTOPRAZOLE SODIUM 40 MG PO TBEC: 40.0000 mg | DELAYED_RELEASE_TABLET | Freq: Every day | ORAL | Status: AC

## 2018-06-18 MED ORDER — ATORVASTATIN CALCIUM 40 MG PO TABS: 40.0000 mg | ORAL_TABLET | Freq: Every day | ORAL | Status: AC

## 2018-06-18 MED ORDER — TRAMADOL HCL 50 MG PO TABS: 50.0000 mg | ORAL_TABLET | Freq: Four times a day (QID) | ORAL | 0 refills | Status: DC | PRN

## 2018-06-18 MED ORDER — DOXYCYCLINE HYCLATE 100 MG PO CAPS: 100.0000 mg | ORAL_CAPSULE | Freq: Two times a day (BID) | ORAL | 0 refills | Status: DC

## 2018-06-18 MED ORDER — TRAZODONE HCL 50 MG PO TABS: 100.0000 mg | ORAL_TABLET | Freq: Every day | ORAL | 5 refills | Status: AC

## 2018-06-18 NOTE — Progress Notes (Signed)
Patient presents for hospital follow up in [facility] today. Reports no problems with VAD equipment or concerns with drive line.  Vital Signs:  Doppler Pressure 102  Automatc BP: 115/76 (88) HR: 84   SPO2: 96  %  Weight: 165.8 lb w/o eqt Last weight: 168.6 lb Home weights: 165 lbs   VAD Indication: Destination Therapy-dialysis   VAD interrogation & Equipment Management: Speed: 9200 Flow: 5.1 Power: 5.6 w    PI: 6.2  Alarms: 1 no external power alarm on 8/4-pt states that he accidentally double disconnected Events: none  Fixed speed 9200 Low speed limit: 8600  Primary Controller:  Replace back up battery in 29 months. Back up controller:   Replace back up battery in 29 months.  Annual Equipment Maintenance on UBC/PM was performed on 12/2017.   I reviewed the LVAD parameters from today and compared the results to the patient's prior recorded data. LVAD interrogation was NEGATIVE for significant power changes, NEGATIVE for clinical alarms and STABLE for PI events/speed drops. No programming changes were made and pump is functioning within specified parameters. Pt is performing daily controller and system monitor self tests along with completing weekly and monthly maintenance for LVAD equipment.  LVAD equipment check completed and is in good working order. Back-up equipment present. Charged back up battery and performed self-test on equipment.   Exit Site Care: Drive line is being maintained weekly by VAD coordinators. Drive line exit site well healed and incorporated. The velour is fully implanted at exit site. Pt states that he accidentally dropped his controller a few nights ago. He had scant amount of serous drainage around driveline. Slight erythema or drainage. Stabilization device present and accurately applied. Pt denies fever or chills.    Significant Events on VAD Support:  - 02/2017> GIB- AVM clipped x2 - 07/2017> renal failure- HD started - 11/2017>Squamous  cell skin CA L neck and RLE: s/p excision 11/20/17. Margins not clear at neck. - 02/23/18> hospitalization for GI bleed. Colon/EGD 02/27/18 with normal EGD. Colonoscopy identified active bleeding in ileum; source not found. Capsule Endoscopy 02/27/18: Showed active bleeding in the ileum. Received 5 units blood with histamine reaction; will give benadryl as OP. - 03/10/18>transferrred to [facility] for double retro balloon scop and sent to IR emolization x 3 with no evidence of bleeding. Received 12 units PCs, remained off anticoagulants. Transferred back to [facility] 03/20/18 Seen by IR 5/20. CTA obtained, but no active bleeding found. Started on octreotide. 03/25/18: Repeat capsule endoscopy completed. Bleeding appears to come from the mid-ileum. 03/26/18: General surgery reconsulted. Recommended transfer back to [facility] for surgery with intraoperative endoscopy. [name] currently discussing options. Ortho consulted 5/24 for left knee pain. S/p knee aspiration and steroid injection.  Dose of premarin given 5/24.  On 5/25 developed abdominalbloating and distension with n/v. KUB suggestive of ileus versus early SBO. NGT placed for decompression. Had large bloody BM after this and felt much better, diet now advanced to regular.  - 04/01/18 transferred to [facility] for  laparoscopic surgical enteroscopy for recurrent GI bleed   Device:Medtronic Therapies: on Last check:    BP & Labs:  MAP 88 - Doppler is reflecting modified systolic  Hgb 10.2 - No S/S of bleeding. Specifically denies melena/BRBPR or nosebleeds.  LDH stable at 237 with established baseline of 200- 280. Denies tea-colored urine. No power elevations noted on interrogation.   1.5 year Intermacs follow up completed including:  Quality of Life, KCCQ-12, and Neurocognitive trail making. (2 min, 54 secs)  Pt unable to complete 6 minute walk d/t knee pain.   Back up controller:  11V backup battery charged during this visit.  Plan: 1. Start Doxycyline  100 mg twice a day for 7 days. 2. We will call dialysis center for dry wt update, [place] for skin cancer on forehead and ortho for f/u for his knee.  3.Refills provided for Ativan and Tramadol.   Trevor Rockers RN VAD Coordinator   Office: [phone] 24/7 Emergency VAD Pager: [phone]      VAD Clinic Note   Trevor [name] is a 72 y.o. male with a history of CAD s/p CABG x 4 2010, OSA, AS with TAVR 2015, CKD, DM2, paroxysmal atrial fibrillation, asthma, and ischemic cardiomyopathy with Medtronic ICD.   Relocated to [place] from [place] in early 2018. Over the last couple of years, he had been admitted multiple times for CHF. Last admission in [place] was in 1/18. Sounds like the hospitalization was complicated by cardiorenal syndrome and there was consideration of doing dialysis.  He was admitted in to [facility] 11/27/16 with NYHA class IV symptoms and hypotension. He was placed on dual inotropes to facilitate diuresis, cardiac output and renal function.  CT surgery consulted for mechanical support and he was deemed appropriate for LVAD work up. He completed LVAD work up and was approved for HMII LVAD --> DT. On 12/11/16, he had IABP placed to optimize cardiac output and improve renal function. He then underwent HMII LVAD placement + tricuspid valve repair on 12/14/2015. Post operatively pressors weaned off slowly. He required significant diuresis.  Course was complicated by RV dysfunction and renal dysfunction.  Patient was admitted with lower GI bleeding in 4/18.  He had been on ASA 325 (increased with mild LDH elevation) and warfarin INR 2-2.5.  He had 4 units PRBCs total.  Colonoscopy showed colonic AVM treated with APC and clipping.  He was sent home off ASA and on warfarin INR goal 2-2.5.   In 5/18, creatinine was noted to increase as high as 3.67.  I cut back on his diuretics but he became volume overloaded.  I increased diuretics again with poor response.  In 5/18, he had  RHC showing elevated right and left heart filling pressures and preserved cardiac output. I admitted him for IV diuresis.  Weight and creatinine came down, creatinine was 2.9 when discharged.  Ramp echo was done and speed was increased to 9200 rpm.  Sildenafil was increased to 40 mg tid.  At the next visit, speed was increased to 9400 rpm and torsemide was decreased to 40 mg daily, later increased back to 60 mg daily with increased volume.   He continued to have problems with up and down renal function, requiring up and down adjustment of torsemide.  He finally was admitted to the hospital in 8/18 with intractable volume overload.  Efforts at diuresis were complicated by AKI.  Speed was increased to 9600 rpm in the hospital. He finally had to undergo CVVH, then was started on intermittent HD.  He was discharged home to continue on HD.    He was admitted in 10/18 for inpatient HD due to excessive weight gain.  He is now going to HD 4 times/week.    In 1/19, he was admitted for skin cancer removal.  He had a squamous cell skin cancer removed from his left neck, margins not clear.  He had a squamous cell skin cancer removed from the right leg, margins were clear.  This admission complicated by hematoma formation at neck site requiring evacuation.  His right leg site appeared to be infected and he had a course of vancomycin.   Pt had a prolonged admission 03/2018 to 05/2018 for GI bleeding. Transferred to [facility] for surgical treatment. 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   He was admitted in 8/19 after HD with dyspnea and cough.  He was volume overloaded and required HD several days in a row. Cough and dyspnea had resolved by discharge.   He returns today for followup of LVAD and CHF.  He says that his dry weight has not been lowered at HD (per my discussion with Dr. Jonnie Finner in  the hospital, plan had been to lower dry weight).  He continues to have left knee pain and walks with a walker.  He is getting PT.  No dyspnea walking around his house with his walker.  Some increased drainage at his driveline exit site, mild elevation in WBCs today.  No BRBPR/melena.    Labs (3/18): LDH 331 => 401 => 443 => 366 => 313, K 3.6 => 3.2 => 3.5, creatinine 1.98 => 1.74 => 1.8 => 1.79, hgb 8.3 => 8.9 => 8.7 => 8.4, INR 2.11 => 2.4 Labs (4/18): hgb 9.4 Labs (5/18): LDH 269 => 249, INR 2.57, K 4.5, creatinine 1.92 => 2.71 => 3.67 => 3.43 => 3.1 => 2.93, hgb 9, plts 104 Labs (6/18): K 4.5, creatinine 3.6 => 3.43, hgb 8.5, INR 2.4, LDH 287\ Labs (7/18): K 4.5, creatinine 3.52 => 4, hgb 7.9 => 8.5 Labs (8/18): K 3.8, creatinine 4, hgb 8.5 Labs (9/18): hgb 8.2 => 9 => 9.9 => 10.6, plts 143, LDH 205 => 251 Labs (4/19): hgb 10, LDH 267 Labs (8/19): hgb 10.1, WBC 14  LVAD parameters: Please see nurses note above. I personally reviewed along with them.   PMH: 1. CAD: s/p CABG in 2010.  2. Aortic stenosis: s/p TAVR in 2015. Valve looked ok on 2/18 echo.  3. Atrial fibrillation: Chronic.  4. Type II diabetes.  5. ESRD  6. Chronic systolic CHF: Ischemic cardiomyopathy with prominent RV dysfunction.  Medtronic ICD.  - Echo (2/18): EF 20-25%, moderately dilated LV, moderately dilated/moderately dysfunctional RV, severe TR.  - Cardiogenic shock 2/18 requiring milrinone, norepinephrine, and IABP.  - Heartmate II LVAD for DT placed 12/13/16.   - RHC (5/18): mean RA 16, PA 55/22 mean 33, mean PCWP 23, CI 2.59, PVR 3.87 - RHC (8/18): mean RA 17, PA 56/25 mean 37, mean PCWP 20, CI 3.2 Fick/2.1 thermo 7. OSA.  8. Thrombocytopenia: Post-op LVAD, resolved.  9. Tricuspid regurgitation: Severe, s/p repair with LVAD placement in 2/18.  10. Lower GI bleed (4/18): Colonic AVM, treated with APC and clipping.  11. Psoriasis 12. Squamous cell skin cancer: left neck, right leg => removed 1/19.  Margins not  clear at neck. Radiation recommended but so far he wants to hold off.   Social History   Socioeconomic History  . Marital status: Widowed    Spouse name: Not on file  . Number of children: 0  . Years of education: 47  . Highest education level: Not on file  Occupational History  . Not on file  Social Needs  . Financial resource strain: Not on file  . Food insecurity:    Worry: Not on file  Inability: Not on file  . Transportation needs:    Medical: Not on file    Non-medical: Not on file  Tobacco Use  . Smoking status: Never Smoker  . Smokeless tobacco: Never Used  Substance and Sexual Activity  . Alcohol use: No  . Drug use: No  . Sexual activity: Not Currently  Lifestyle  . Physical activity:    Days per week: Not on file    Minutes per session: Not on file  . Stress: Not on file  Relationships  . Social connections:    Talks on phone: Not on file    Gets together: Not on file    Attends religious service: Not on file    Active member of club or organization: Not on file    Attends meetings of clubs or organizations: Not on file    Relationship status: Not on file  . Intimate partner violence:    Fear of current or ex partner: Not on file    Emotionally abused: Not on file    Physically abused: Not on file    Forced sexual activity: Not on file  Other Topics Concern  . Not on file  Social History Narrative   Patient is a widow. Moved to [place] from [place], [place]. Currently lives with his sister Trevor [name].    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    Family History  Problem Relation Age of Onset  . Heart failure Father   . Heart attack Father   . Healthy Mother   . Diabetes Paternal Grandfather    Review of systems complete and found to be negative unless listed in HPI.    Current Outpatient Medications  Medication Sig Dispense Refill  . acetaminophen (TYLENOL) 325 MG tablet Take 2 tablets (650 mg total) by mouth every 4 (four)  hours as needed for headache or mild pain.    Marland Kitchen albuterol (PROVENTIL HFA;VENTOLIN HFA) 108 (90 Base) MCG/ACT inhaler Inhale 2 puffs into the lungs every 4 (four) hours as needed for wheezing or shortness of breath. 3 Inhaler 5  . atorvastatin (LIPITOR) 40 MG tablet Take 1 tablet (40 mg total) by mouth daily at 6 PM.    . benzonatate (TESSALON) 100 MG capsule Take 1 capsule (100 mg total) by mouth 3 (three) times daily as needed for cough. 30 capsule 0  . busPIRone (BUSPAR) 5 MG tablet Take 1 tablet (5 mg total) by mouth 2 (two) times daily. 180 tablet 3  . calcitRIOL (ROCALTROL) 0.5 MCG capsule Take 1 capsule (0.5 mcg total) by mouth every Monday, Wednesday, and Friday with hemodialysis.    . Darbepoetin Alfa (ARANESP) 200 MCG/0.4ML SOSY injection Inject 0.4 mLs (200 mcg total) into the vein every Monday with hemodialysis. 1.68 mL   . docusate sodium (COLACE) 250 MG capsule Take 1 capsule (250 mg total) by mouth daily. 10 capsule 0  . fluticasone (FLOVENT HFA) 110 MCG/ACT inhaler Inhale 2 puffs into the lungs 2 (two) times daily. 3 Inhaler 5  . hydrocerin (EUCERIN) CREA Apply 1 application topically 2 (two) times daily. (Patient taking differently: Apply 1 application topically 2 (two) times daily as needed (dry skin). ) 113 g 0  . insulin glargine (LANTUS) 100 UNIT/ML injection Use 4 units in morning and 6 units at bedtime. (Patient taking differently: Inject 5 Units into the skin 2 (two) times daily. ) 10 mL 11  . levothyroxine (SYNTHROID, LEVOTHROID) 25 MCG tablet Take 1 tablet (25 mcg total) by  mouth daily before breakfast. 90 tablet 3  . LORazepam (ATIVAN) 0.5 MG tablet Take 1 tablet (0.5 mg total) by mouth every 8 (eight) hours as needed for anxiety (for nausea or anxiety). 30 tablet 0  . megestrol (MEGACE) 40 MG tablet Take 1 tablet (40 mg total) by mouth daily. 30 tablet 5  . multivitamin (RENA-VIT) TABS tablet Take 1 tablet by mouth at bedtime. 30 tablet 6  . sildenafil (REVATIO) 20 MG tablet  Take 1 tablet (20 mg total) by mouth 3 (three) times daily. 90 tablet 6  . traZODone (DESYREL) 50 MG tablet Take 2 tablets (100 mg total) by mouth at bedtime. 60 tablet 5  . albuterol (PROVENTIL) (2.5 MG/3ML) 0.083% nebulizer solution Take 2.5 mg by nebulization every 4 (four) hours as needed for wheezing or shortness of breath.    . doxycycline (VIBRAMYCIN) 100 MG capsule Take 1 capsule (100 mg total) by mouth 2 (two) times daily for 7 days. 14 capsule 0  . pantoprazole (PROTONIX) 40 MG tablet Take 1 tablet (40 mg total) by mouth daily.    . polyethylene glycol (MIRALAX / GLYCOLAX) packet Take 17 g by mouth daily as needed for mild constipation. (Patient not taking: Reported on 06/18/2018) 14 each 0  . senna-docusate (SENOKOT-S) 8.6-50 MG tablet Take 1 tablet by mouth 2 (two) times daily. (Patient not taking: Reported on 06/18/2018)    . traMADol (ULTRAM) 50 MG tablet Take 1 tablet (50 mg total) by mouth every 6 (six) hours as needed. 30 tablet 0  . warfarin (COUMADIN) 5 MG tablet Take 1 tablet (5 mg) daily except 1/2 tablet (2.5 mg) on Tuesday     No current facility-administered medications for this encounter.    Vitals:   06/18/18 1057 06/18/18 1104  BP: (!) 102/0 115/76  Pulse: 84   SpO2: 96%   Weight: 75.2 kg (165 lb 12.8 oz)   Height: 5\' 5"  (1.651 m)     General: Well appearing this am. NAD.  HEENT: Normal. Neck: Supple, JVP 10-11 cm. Carotids OK.  Cardiac:  Mechanical heart sounds with LVAD hum present.  Lungs:  CTAB, normal effort.  Abdomen:  NT, ND, no HSM. No bruits or masses. +BS  LVAD exit site: Well-healed and incorporated. Dressing dry and intact. No erythema or drainage. Stabilization device present and accurately applied. Driveline dressing changed daily per sterile technique. Extremities:  Warm and dry. No cyanosis, clubbing, rash. 1+ edema to knees bilaterally.  Neuro:  Alert & oriented x 3. Cranial nerves grossly intact. Moves all 4 extremities w/o difficulty. Affect  pleasant     Assessment/Plan: 1. Chronic systolic CHF: Ischemic cardiomyopathy, EF 20-25% with RV dysfunction on 2/18 echo pre-LVAD.  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  Medtronic ICD.  He has had problems with RV failure post-op.  He developed worsening renal dysfunction and finally had to start HD in 8/18. There has been some difficulty getting adequate fluid off him, currently dialyzing 4 days/week.  He was recently admitted with volume overload and had HD several days in a row.  Per inpatient renal consult, planned to lower dry weight but this does not seem to have been carried over to his outpatient HD. NYHA III symptoms, stable. He does appear volume overloaded still.  - We will contact his dialysis center about lowering his dry weight.  Suspect he lost muscle mass during recent prolonged hospitalization and will need a lower dry weight.  - Continue warfarin with INR goal 1.8 - 2.3.  No  ASA with GI bleeding.  2. CAD: s/p CABG. No chest pain.  - He is on atorvastatin.  3. ESRD: Ongoing HD 4 days/week, see above.  4. Atrial fibrillation: Chronic.   - On warfarin. 5. RV failure:  Continue Revatio at 40 mg tid.   6. HTN: MAP stable.  7. 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  - Hgb 10.1  8. GI bleeding: GI AVMs. Now s/p laparoscopic surgical enteroscopy with 4 clips on 04/10/18 at [facility], c/b ischemic bowel/SBO. No evidence currently for overt bleeding. Hgb 10.1 today. No BRBPR/melena.  - Tolerating warfarin. Goal INR 1.8-2.3, adjust to keep in this range.  - Getting Aranesp per nephrology.  - Continue octreotide  9. Squamous cell skin cancer:  Neck excision site without clear margins.  He saw Dr Lisbeth Renshaw with radiation oncology, radiation was recommended.  He wants to hold off for the time being, does not think he can handle radiation as HD wears him out 4 days/week. He now has a nodule concerning for skin cancer on his forehead, will refer him back  to [name].   57. H/o TAVR:  Stable on last echo.  11. Septic Left Knee: s/p I&D and surgical wash out at [facility] - Getting PT.  - Needs ortho followup.  12. ID: WBCs mildly elevated, increased drainage at driveline site.  - Will start doxycycline 100 mg bid x 7 days.   Loralie Champagne, MD  06/18/2018

## 2018-06-19 ENCOUNTER — Other Ambulatory Visit (HOSPITAL_COMMUNITY): Payer: Self-pay | Admitting: Unknown Physician Specialty

## 2018-06-19 DIAGNOSIS — Z95811 Presence of heart assist device: Secondary | ICD-10-CM

## 2018-06-22 ENCOUNTER — Other Ambulatory Visit: Payer: Self-pay

## 2018-06-22 ENCOUNTER — Emergency Department (HOSPITAL_COMMUNITY)
Admission: EM | Admit: 2018-06-22 | Discharge: 2018-06-22 | Disposition: A | Discharge status: Home / Self Care | Payer: Medicare Other | Source: Home / Self Care | Attending: Emergency Medicine | Admitting: Emergency Medicine

## 2018-06-22 ENCOUNTER — Emergency Department (HOSPITAL_COMMUNITY): Payer: Medicare Other

## 2018-06-22 ENCOUNTER — Ambulatory Visit (HOSPITAL_COMMUNITY): Payer: Self-pay | Admitting: Pharmacist

## 2018-06-22 ENCOUNTER — Inpatient Hospital Stay (HOSPITAL_COMMUNITY)
Admission: AD | Admit: 2018-06-22 | Discharge: 2018-07-01 | DRG: 391 | Disposition: A | Discharge status: Home Health | Payer: Medicare Other | Source: Ambulatory Visit | Attending: Internal Medicine | Admitting: Internal Medicine

## 2018-06-22 ENCOUNTER — Encounter (HOSPITAL_COMMUNITY): Payer: Self-pay | Admitting: *Deleted

## 2018-06-22 DIAGNOSIS — I50810 Right heart failure, unspecified: Secondary | ICD-10-CM | POA: Diagnosis present

## 2018-06-22 DIAGNOSIS — D631 Anemia in chronic kidney disease: Secondary | ICD-10-CM | POA: Diagnosis present

## 2018-06-22 DIAGNOSIS — Z79890 Hormone replacement therapy (postmenopausal): Secondary | ICD-10-CM | POA: Diagnosis not present

## 2018-06-22 DIAGNOSIS — Z951 Presence of aortocoronary bypass graft: Secondary | ICD-10-CM | POA: Diagnosis not present

## 2018-06-22 DIAGNOSIS — I132 Hypertensive heart and chronic kidney disease with heart failure and with stage 5 chronic kidney disease, or end stage renal disease: Secondary | ICD-10-CM | POA: Diagnosis present

## 2018-06-22 DIAGNOSIS — C4442 Squamous cell carcinoma of skin of scalp and neck: Secondary | ICD-10-CM | POA: Diagnosis present

## 2018-06-22 DIAGNOSIS — I4821 Permanent atrial fibrillation: Secondary | ICD-10-CM | POA: Diagnosis present

## 2018-06-22 DIAGNOSIS — Z952 Presence of prosthetic heart valve: Secondary | ICD-10-CM

## 2018-06-22 DIAGNOSIS — Z7901 Long term (current) use of anticoagulants: Secondary | ICD-10-CM

## 2018-06-22 DIAGNOSIS — Z794 Long term (current) use of insulin: Secondary | ICD-10-CM

## 2018-06-22 DIAGNOSIS — K5900 Constipation, unspecified: Secondary | ICD-10-CM

## 2018-06-22 DIAGNOSIS — Z95810 Presence of automatic (implantable) cardiac defibrillator: Secondary | ICD-10-CM

## 2018-06-22 DIAGNOSIS — N186 End stage renal disease: Secondary | ICD-10-CM | POA: Diagnosis present

## 2018-06-22 DIAGNOSIS — J45909 Unspecified asthma, uncomplicated: Secondary | ICD-10-CM | POA: Diagnosis present

## 2018-06-22 DIAGNOSIS — K5520 Angiodysplasia of colon without hemorrhage: Secondary | ICD-10-CM | POA: Diagnosis present

## 2018-06-22 DIAGNOSIS — R109 Unspecified abdominal pain: Secondary | ICD-10-CM

## 2018-06-22 DIAGNOSIS — R220 Localized swelling, mass and lump, head: Secondary | ICD-10-CM | POA: Diagnosis present

## 2018-06-22 DIAGNOSIS — R0602 Shortness of breath: Secondary | ICD-10-CM | POA: Diagnosis present

## 2018-06-22 DIAGNOSIS — E669 Obesity, unspecified: Secondary | ICD-10-CM | POA: Diagnosis present

## 2018-06-22 DIAGNOSIS — I2510 Atherosclerotic heart disease of native coronary artery without angina pectoris: Secondary | ICD-10-CM | POA: Diagnosis present

## 2018-06-22 DIAGNOSIS — I480 Paroxysmal atrial fibrillation: Secondary | ICD-10-CM | POA: Diagnosis present

## 2018-06-22 DIAGNOSIS — I255 Ischemic cardiomyopathy: Secondary | ICD-10-CM | POA: Diagnosis present

## 2018-06-22 DIAGNOSIS — I5022 Chronic systolic (congestive) heart failure: Secondary | ICD-10-CM | POA: Diagnosis present

## 2018-06-22 DIAGNOSIS — G43A0 Cyclical vomiting, not intractable: Secondary | ICD-10-CM | POA: Diagnosis not present

## 2018-06-22 DIAGNOSIS — Z95811 Presence of heart assist device: Secondary | ICD-10-CM

## 2018-06-22 DIAGNOSIS — R112 Nausea with vomiting, unspecified: Secondary | ICD-10-CM | POA: Diagnosis not present

## 2018-06-22 DIAGNOSIS — Z7951 Long term (current) use of inhaled steroids: Secondary | ICD-10-CM

## 2018-06-22 DIAGNOSIS — E1122 Type 2 diabetes mellitus with diabetic chronic kidney disease: Secondary | ICD-10-CM | POA: Diagnosis present

## 2018-06-22 DIAGNOSIS — I482 Chronic atrial fibrillation: Secondary | ICD-10-CM | POA: Diagnosis not present

## 2018-06-22 DIAGNOSIS — I472 Ventricular tachycardia: Secondary | ICD-10-CM | POA: Diagnosis not present

## 2018-06-22 DIAGNOSIS — G4733 Obstructive sleep apnea (adult) (pediatric): Secondary | ICD-10-CM | POA: Diagnosis present

## 2018-06-22 DIAGNOSIS — Z6827 Body mass index (BMI) 27.0-27.9, adult: Secondary | ICD-10-CM

## 2018-06-22 DIAGNOSIS — Z79899 Other long term (current) drug therapy: Secondary | ICD-10-CM

## 2018-06-22 DIAGNOSIS — Z8249 Family history of ischemic heart disease and other diseases of the circulatory system: Secondary | ICD-10-CM | POA: Diagnosis not present

## 2018-06-22 LAB — COMPREHENSIVE METABOLIC PANEL
ALBUMIN: 3.1 g/dL — AB (ref 3.5–5.0)
ALK PHOS: 87 U/L (ref 38–126)
ALT: 30 U/L (ref 0–44)
AST: 33 U/L (ref 15–41)
Anion gap: 12 (ref 5–15)
BILIRUBIN TOTAL: 1 mg/dL (ref 0.3–1.2)
BUN: 30 mg/dL — AB (ref 8–23)
CALCIUM: 8.6 mg/dL — AB (ref 8.9–10.3)
CO2: 26 mmol/L (ref 22–32)
CREATININE: 2.83 mg/dL — AB (ref 0.61–1.24)
Chloride: 97 mmol/L — ABNORMAL LOW (ref 98–111)
GFR calc Af Amer: 24 mL/min — ABNORMAL LOW (ref >60)
GFR calc non Af Amer: 21 mL/min — ABNORMAL LOW (ref >60)
GLUCOSE: 326 mg/dL — AB (ref 70–99)
Potassium: 3.9 mmol/L (ref 3.5–5.1)
SODIUM: 135 mmol/L (ref 135–145)
TOTAL PROTEIN: 6.4 g/dL — AB (ref 6.5–8.1)

## 2018-06-22 LAB — APTT: aPTT: 31 seconds (ref 24–36)

## 2018-06-22 LAB — CBC WITH DIFFERENTIAL/PLATELET
ABS IMMATURE GRANULOCYTES: 0.1 10*3/uL (ref 0.0–0.1)
BASOS ABS: 0.1 10*3/uL (ref 0.0–0.1)
Basophils Relative: 1 %
EOS PCT: 1 %
Eosinophils Absolute: 0.1 10*3/uL (ref 0.0–0.7)
HEMATOCRIT: 30.8 % — AB (ref 39.0–52.0)
HEMOGLOBIN: 9.2 g/dL — AB (ref 13.0–17.0)
IMMATURE GRANULOCYTES: 1 %
LYMPHS ABS: 0.7 10*3/uL (ref 0.7–4.0)
LYMPHS PCT: 6 %
MCH: 28.2 pg (ref 26.0–34.0)
MCHC: 29.9 g/dL — AB (ref 30.0–36.0)
MCV: 94.5 fL (ref 78.0–100.0)
Monocytes Absolute: 0.8 10*3/uL (ref 0.1–1.0)
Monocytes Relative: 7 %
NEUTROS ABS: 8.6 10*3/uL — AB (ref 1.7–7.7)
Neutrophils Relative %: 84 %
Platelets: 159 10*3/uL (ref 150–400)
RBC: 3.26 MIL/uL — AB (ref 4.22–5.81)
RDW: 18.2 % — ABNORMAL HIGH (ref 11.5–15.5)
WBC: 10.2 10*3/uL (ref 4.0–10.5)

## 2018-06-22 LAB — GLUCOSE, CAPILLARY: Glucose-Capillary: 288 mg/dL — ABNORMAL HIGH (ref 70–99)

## 2018-06-22 LAB — LACTATE DEHYDROGENASE: LDH: 262 U/L — ABNORMAL HIGH (ref 98–192)

## 2018-06-22 LAB — TYPE AND SCREEN
ABO/RH(D): A NEG
ANTIBODY SCREEN: NEGATIVE

## 2018-06-22 LAB — I-STAT TROPONIN, ED: Troponin i, poc: 0.06 ng/mL (ref 0.00–0.08)

## 2018-06-22 LAB — PROTIME-INR
INR: 1.37
PROTHROMBIN TIME: 16.8 s — AB (ref 11.4–15.2)

## 2018-06-22 MED ORDER — INSULIN GLARGINE 100 UNIT/ML ~~LOC~~ SOLN: 4.0000 [IU] | SUBCUTANEOUS | Status: DC

## 2018-06-22 MED ORDER — DRONABINOL 2.5 MG PO CAPS
2.5000 mg | ORAL_CAPSULE | Freq: Two times a day (BID) | ORAL | Status: DC
  Administered 2018-06-23 – 2018-07-01 (×16): 2.5 mg via ORAL
  Filled 2018-06-22 (×17): qty 1

## 2018-06-22 MED ORDER — INSULIN GLARGINE 100 UNIT/ML ~~LOC~~ SOLN
6.0000 [IU] | Freq: Every day | SUBCUTANEOUS | Status: DC
  Administered 2018-06-23 – 2018-07-01 (×8): 6 [IU] via SUBCUTANEOUS
  Filled 2018-06-22 (×9): qty 0.06

## 2018-06-22 MED ORDER — FENTANYL CITRATE (PF) 100 MCG/2ML IJ SOLN
25.0000 ug | Freq: Once | INTRAMUSCULAR | Status: AC
  Administered 2018-06-22: 25 ug via INTRAVENOUS
  Filled 2018-06-22: qty 2

## 2018-06-22 MED ORDER — INSULIN ASPART 100 UNIT/ML ~~LOC~~ SOLN
0.0000 [IU] | Freq: Three times a day (TID) | SUBCUTANEOUS | Status: DC
  Administered 2018-06-23 – 2018-06-24 (×3): 2 [IU] via SUBCUTANEOUS
  Administered 2018-06-25 (×2): 3 [IU] via SUBCUTANEOUS
  Administered 2018-06-26: 5 [IU] via SUBCUTANEOUS
  Administered 2018-06-27: 2 [IU] via SUBCUTANEOUS
  Administered 2018-06-27: 3 [IU] via SUBCUTANEOUS
  Administered 2018-06-27: 1 [IU] via SUBCUTANEOUS
  Administered 2018-06-28: 2 [IU] via SUBCUTANEOUS
  Administered 2018-06-28: 3 [IU] via SUBCUTANEOUS
  Administered 2018-06-29: 2 [IU] via SUBCUTANEOUS
  Administered 2018-06-30: 1 [IU] via SUBCUTANEOUS
  Administered 2018-06-30: 2 [IU] via SUBCUTANEOUS
  Administered 2018-06-30: 1 [IU] via SUBCUTANEOUS
  Administered 2018-07-01: 2 [IU] via SUBCUTANEOUS

## 2018-06-22 MED ORDER — FLEET ENEMA 7-19 GM/118ML RE ENEM
1.0000 | ENEMA | Freq: Once | RECTAL | Status: AC
  Administered 2018-06-22: 1 via RECTAL
  Filled 2018-06-22: qty 1

## 2018-06-22 MED ORDER — TRAMADOL HCL 50 MG PO TABS
50.0000 mg | ORAL_TABLET | Freq: Four times a day (QID) | ORAL | Status: DC | PRN
  Administered 2018-06-22 – 2018-07-01 (×16): 50 mg via ORAL
  Filled 2018-06-22 (×16): qty 1

## 2018-06-22 MED ORDER — ACETAMINOPHEN 325 MG PO TABS
650.0000 mg | ORAL_TABLET | ORAL | Status: DC | PRN
  Administered 2018-06-27: 650 mg via ORAL
  Filled 2018-06-22: qty 2

## 2018-06-22 MED ORDER — BENZONATATE 100 MG PO CAPS
100.0000 mg | ORAL_CAPSULE | Freq: Three times a day (TID) | ORAL | Status: DC | PRN
  Administered 2018-06-25: 100 mg via ORAL
  Filled 2018-06-22: qty 1

## 2018-06-22 MED ORDER — CALCITRIOL 0.5 MCG PO CAPS
0.5000 ug | ORAL_CAPSULE | ORAL | Status: DC
  Administered 2018-06-24 – 2018-07-01 (×4): 0.5 ug via ORAL
  Filled 2018-06-22 (×5): qty 1
  Filled 2018-06-22: qty 2

## 2018-06-22 MED ORDER — LEVOTHYROXINE SODIUM 25 MCG PO TABS
25.0000 ug | ORAL_TABLET | Freq: Every day | ORAL | Status: DC
  Administered 2018-06-23 – 2018-06-30 (×8): 25 ug via ORAL
  Filled 2018-06-22 (×8): qty 1

## 2018-06-22 MED ORDER — HEPARIN (PORCINE) IN NACL 100-0.45 UNIT/ML-% IJ SOLN
1300.0000 [IU]/h | INTRAMUSCULAR | Status: DC
  Administered 2018-06-22: 900 [IU]/h via INTRAVENOUS
  Filled 2018-06-22 (×2): qty 250

## 2018-06-22 MED ORDER — SILDENAFIL CITRATE 20 MG PO TABS
20.0000 mg | ORAL_TABLET | Freq: Three times a day (TID) | ORAL | Status: DC
  Administered 2018-06-22 – 2018-07-01 (×25): 20 mg via ORAL
  Filled 2018-06-22 (×25): qty 1

## 2018-06-22 MED ORDER — DOXYCYCLINE HYCLATE 100 MG PO TABS
100.0000 mg | ORAL_TABLET | Freq: Two times a day (BID) | ORAL | Status: DC
  Administered 2018-06-22 – 2018-06-23 (×3): 100 mg via ORAL
  Filled 2018-06-22 (×3): qty 1

## 2018-06-22 MED ORDER — ONDANSETRON HCL 4 MG/2ML IJ SOLN
4.0000 mg | Freq: Once | INTRAMUSCULAR | Status: AC
  Administered 2018-06-22: 4 mg via INTRAVENOUS
  Filled 2018-06-22: qty 2

## 2018-06-22 MED ORDER — ATORVASTATIN CALCIUM 40 MG PO TABS
40.0000 mg | ORAL_TABLET | Freq: Every day | ORAL | Status: DC
  Administered 2018-06-23 – 2018-06-30 (×8): 40 mg via ORAL
  Filled 2018-06-22 (×8): qty 1

## 2018-06-22 MED ORDER — ALBUTEROL SULFATE (2.5 MG/3ML) 0.083% IN NEBU
2.5000 mg | INHALATION_SOLUTION | RESPIRATORY_TRACT | Status: DC | PRN
  Administered 2018-06-22: 2.5 mg via RESPIRATORY_TRACT
  Filled 2018-06-22 (×2): qty 3

## 2018-06-22 MED ORDER — ALBUTEROL SULFATE HFA 108 (90 BASE) MCG/ACT IN AERS: 2.0000 | INHALATION_SPRAY | RESPIRATORY_TRACT | Status: DC | PRN

## 2018-06-22 MED ORDER — INSULIN GLARGINE 100 UNIT/ML ~~LOC~~ SOLN
4.0000 [IU] | Freq: Every day | SUBCUTANEOUS | Status: DC
  Administered 2018-06-23 – 2018-06-30 (×8): 4 [IU] via SUBCUTANEOUS
  Filled 2018-06-22 (×9): qty 0.04

## 2018-06-22 MED ORDER — BUDESONIDE 0.25 MG/2ML IN SUSP
0.2500 mg | Freq: Two times a day (BID) | RESPIRATORY_TRACT | Status: DC
  Administered 2018-06-23 – 2018-06-30 (×14): 0.25 mg via RESPIRATORY_TRACT
  Filled 2018-06-22 (×16): qty 2

## 2018-06-22 MED ORDER — RENA-VITE PO TABS
1.0000 | ORAL_TABLET | Freq: Every day | ORAL | Status: DC
  Administered 2018-06-22 – 2018-06-30 (×9): 1 via ORAL
  Filled 2018-06-22 (×9): qty 1

## 2018-06-22 MED ORDER — DOCUSATE SODIUM 100 MG PO CAPS
200.0000 mg | ORAL_CAPSULE | Freq: Every day | ORAL | Status: DC
  Administered 2018-06-23 – 2018-06-30 (×7): 200 mg via ORAL
  Filled 2018-06-22 (×9): qty 2

## 2018-06-22 MED ORDER — ONDANSETRON HCL 4 MG/2ML IJ SOLN: 4.0000 mg | Freq: Four times a day (QID) | INTRAMUSCULAR | Status: DC | PRN

## 2018-06-22 MED ORDER — LORAZEPAM 0.5 MG PO TABS: 0.5000 mg | ORAL_TABLET | Freq: Three times a day (TID) | ORAL | Status: DC | PRN

## 2018-06-22 MED ORDER — INSULIN ASPART 100 UNIT/ML ~~LOC~~ SOLN
0.0000 [IU] | Freq: Every day | SUBCUTANEOUS | Status: DC
  Administered 2018-06-22: 3 [IU] via SUBCUTANEOUS
  Administered 2018-06-24 – 2018-06-29 (×2): 2 [IU] via SUBCUTANEOUS

## 2018-06-22 MED ORDER — POLYETHYLENE GLYCOL 3350 17 G PO PACK: 17.0000 g | PACK | Freq: Every day | ORAL | Status: DC | PRN

## 2018-06-22 MED ORDER — LORAZEPAM 0.5 MG PO TABS
0.5000 mg | ORAL_TABLET | ORAL | Status: DC | PRN
  Administered 2018-06-22 – 2018-07-01 (×7): 0.5 mg via ORAL
  Filled 2018-06-22 (×7): qty 1

## 2018-06-22 MED ORDER — ACETAMINOPHEN 325 MG PO TABS: 650.0000 mg | ORAL_TABLET | ORAL | Status: DC | PRN

## 2018-06-22 MED ORDER — TRAZODONE HCL 50 MG PO TABS
100.0000 mg | ORAL_TABLET | Freq: Every day | ORAL | Status: DC
  Administered 2018-06-22 – 2018-06-30 (×9): 100 mg via ORAL
  Filled 2018-06-22 (×9): qty 2

## 2018-06-22 MED ORDER — SENNOSIDES-DOCUSATE SODIUM 8.6-50 MG PO TABS
1.0000 | ORAL_TABLET | Freq: Two times a day (BID) | ORAL | Status: DC
  Administered 2018-06-22 – 2018-06-30 (×16): 1 via ORAL
  Filled 2018-06-22 (×16): qty 1

## 2018-06-22 MED ORDER — BUSPIRONE HCL 5 MG PO TABS
5.0000 mg | ORAL_TABLET | Freq: Two times a day (BID) | ORAL | Status: DC
  Administered 2018-06-22 – 2018-07-01 (×18): 5 mg via ORAL
  Filled 2018-06-22 (×18): qty 1

## 2018-06-22 MED ORDER — MEGESTROL ACETATE 40 MG PO TABS
40.0000 mg | ORAL_TABLET | Freq: Every day | ORAL | Status: DC
Start: 2018-06-23 — End: 2018-07-01
  Administered 2018-06-23 – 2018-07-01 (×9): 40 mg via ORAL
  Filled 2018-06-22 (×9): qty 1

## 2018-06-22 MED ORDER — FLUTICASONE PROPIONATE HFA 110 MCG/ACT IN AERO: 2.0000 | INHALATION_SPRAY | Freq: Two times a day (BID) | RESPIRATORY_TRACT | Status: DC

## 2018-06-22 MED ORDER — PANTOPRAZOLE SODIUM 40 MG PO TBEC
40.0000 mg | DELAYED_RELEASE_TABLET | Freq: Every day | ORAL | Status: DC
  Administered 2018-06-22 – 2018-07-01 (×10): 40 mg via ORAL
  Filled 2018-06-22 (×10): qty 1

## 2018-06-22 MED ORDER — HYDROCERIN EX CREA: 1.0000 "application " | TOPICAL_CREAM | Freq: Two times a day (BID) | CUTANEOUS | Status: DC | PRN

## 2018-06-22 MED ORDER — ALBUTEROL SULFATE (2.5 MG/3ML) 0.083% IN NEBU: 5.0000 mg | INHALATION_SOLUTION | Freq: Once | RESPIRATORY_TRACT | Status: DC

## 2018-06-22 NOTE — Progress Notes (Signed)
Pt remains in ED B14 - sleeping.  Existing VAD dressing removed and site care performed using sterile technique. Drive line exit site cleaned with Chlora prep applicators x 2, rinsed with sterile saline, allowed to dry, and Sorbaview dressing WITHOUT bio patch re-applied. Exit site healed and incorporated, the velour is fully implanted at exit site. No redness, tenderness, drainage, foul odor or rash noted. Drive line anchor re-applied. Covered with large Tegaderm per pt's request. Pt denies fever or chills.   Discussed low INR today (1.37) with Lysbeth Galas, PharmD.Pt confirmed he increased his coumadin dose as instructed last INR check, denies missing any doses.    Verbal and written instructions given to patient per Doroteo Bradford to increase coumadin to 5 mg daily; he is to take 7.5 mg today only.  Will return to [place] clinic next Tuesday at 9:00 for INR, LDH, and dressing change. Pt verbalized understanding of same.   [name] RN, VAD Coordinator 24/7 VAD pager: [phone]

## 2018-06-22 NOTE — Discharge Instructions (Signed)
You have been scheduled for dialysis tomorrow since you were unable to complete it today.  The LVAD team will follow up with you.  Call if you have any questions.  You can take MiraLAX as needed for constipation.

## 2018-06-22 NOTE — ED Triage Notes (Signed)
Pt in from HD where he received 32 mins out of a 4 hr tx, pt sent to ED because he was reported to have a low hemoglobin and elevated WBC and c/o abd pain and SOB, pt A&O x4

## 2018-06-22 NOTE — H&P (Addendum)
Advanced Heart Failure VAD History and Physical Note   PCP-Cardiologist: No primary care provider on file.   Reason for Visit: Acute SOB and Abdominal Pain.   HPI:    Trevor [name] is a 72 y.o. 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   He was admitted in 8/5 - 8/919 after HD with dyspnea and cough.  He was volume overloaded and required HD several days in a row. Cough and dyspnea had resolved by discharge.   Seen in LVAD clinic 06/18/18 and was feeling OK. Felt as if his dry weight had not been lowered at HD as planned. He denied dyspnea walking around house.   Pt presented to [facility] this am via EMS from HD. Started having SOB and abdominal pain approx 30 minutes into HD. His SOB is his "normal" response to HD, but the abdominal pain is new. He had a "small" bowel movement yesterday, but is not sure he has passed gas since then. His appetite was normal last night and this am. He is anxious. HD reported anemia on morning labwork, but unable to see. ED labs pending. He denies any further bleeding.   ABD Portable 1V 06/22/18 - No acute finding. Moderately large colonic stool burden.   CXR 06/22/18 - No acute findings.   LVAD Interrogation HM 2: Speed: 9200 Flow: 5.5 PI: 5.7 Power: 5.7. No PI events   Review of Systems: [y] = yes, [ ]  = no   General: Weight gain [y]; Weight loss [ ] ; Anorexia [ ] ; Fatigue [y]; Fever [ ] ; Chills [ ] ; Weakness [ ]   Cardiac: Chest pain/pressure [ ] ; Resting SOB [y]; Exertional SOB [y]; Orthopnea [y]; Pedal Edema [y]; Palpitations [ ] ; Syncope [ ] ; Presyncope [ ] ; Paroxysmal nocturnal dyspnea[ ]   Pulmonary: Cough [ ] ; Wheezing[ ] ; Hemoptysis[ ] ; Sputum [ ] ; Snoring  [ ]   GI: Vomiting[ ] ; Dysphagia[ ] ; Melena[ ] ; Hematochezia [ ] ; Heartburn[ ] ; Abdominal pain [1]; Constipation [ ] ; Diarrhea [ ] ; BRBPR [ ]   GU: Hematuria[ ] ; Dysuria [ ] ; Nocturia[ ]   Vascular: Pain in legs with walking [ ] ; Pain in feet with lying flat [ ] ; Non-healing sores [ ] ; Stroke [ ] ; TIA [ ] ; Slurred speech [ ] ;  Neuro: Headaches[ ] ; Vertigo[ ] ; Seizures[ ] ; Paresthesias[ ] ;Blurred vision [ ] ; Diplopia [ ] ; Vision changes [ ]   Ortho/Skin: Arthritis [y]; Joint pain [y]; Muscle pain [ ] ; Joint swelling [ ] ; Back Pain [ ] ; Rash [ ]   Psych: Depression[ ] ; Anxiety[ ]   Heme: Bleeding problems [ ] ; Clotting disorders [ ] ; Anemia [ ]   Endocrine: Diabetes [ ] ; Thyroid dysfunction[ ]     Home Medications Prior to Admission medications   Medication Sig Start Date End Date Taking? Authorizing Provider  acetaminophen (TYLENOL) 325 MG tablet Take 2 tablets (650 mg total) by mouth every 4 (four) hours as needed for headache or mild pain. 03/10/18   Shirley Friar, PA-C  albuterol (PROVENTIL HFA;VENTOLIN HFA) 108 (90 Base) MCG/ACT inhaler Inhale 2 puffs into the lungs every 4 (four) hours as needed for wheezing or shortness of breath. 02/10/18   Larey Dresser, MD  albuterol (PROVENTIL) (2.5 MG/3ML) 0.083% nebulizer solution Take  2.5 mg by nebulization every 4 (four) hours as needed for wheezing or shortness of breath.    [provider]  atorvastatin (LIPITOR) 40 MG tablet Take 1 tablet (40 mg total) by mouth daily at 6 PM. 06/18/18   Larey Dresser, MD  benzonatate (TESSALON) 100 MG capsule Take 1 capsule (100 mg total) by mouth 3 (three) times daily as needed for cough. 06/12/18   Shirley Friar, PA-C  busPIRone (BUSPAR) 5 MG tablet Take 1 tablet (5 mg total) by mouth 2 (two) times daily. 01/22/18   Bensimhon, Shaune Pascal, MD  calcitRIOL (ROCALTROL) 0.5 MCG capsule Take 1 capsule (0.5 mcg total) by mouth every Monday, Wednesday, and Friday with hemodialysis. 03/11/18   Shirley Friar, PA-C  Darbepoetin Alfa (ARANESP) 200 MCG/0.4ML SOSY injection Inject 0.4 mLs (200 mcg total) into the vein every Monday with hemodialysis. 04/06/18   Clegg, Amy D, NP  docusate sodium (COLACE) 250 MG capsule Take 1 capsule (250 mg total) by mouth daily. 06/04/18   Larey Dresser, MD  doxycycline (VIBRAMYCIN) 100 MG capsule Take 1 capsule (100 mg total) by mouth 2 (two) times daily for 7 days. 06/18/18 06/25/18  Larey Dresser, MD  fluticasone (FLOVENT HFA) 110 MCG/ACT inhaler Inhale 2 puffs into the lungs 2 (two) times daily. 02/10/18   Larey Dresser, MD  hydrocerin (EUCERIN) CREA Apply 1 application topically 2 (two) times daily. Patient taking differently: Apply 1 application topically 2 (two) times daily as needed (dry skin).  05/19/18   Shirley Friar, PA-C  insulin glargine (LANTUS) 100 UNIT/ML injection Use 4 units in morning and 6 units at bedtime. Patient taking differently: Inject 5 Units into the skin 2 (two) times daily.  05/19/18   Love, Ivan Anchors, PA-C  levothyroxine (SYNTHROID, LEVOTHROID) 25 MCG tablet Take 1 tablet (25 mcg total) by mouth daily before breakfast. 10/02/17   Larey Dresser, MD  LORazepam (ATIVAN) 0.5 MG tablet Take 1 tablet (0.5 mg total) by mouth every 8 (eight) hours as needed for anxiety (for nausea or anxiety). 06/18/18   Larey Dresser, MD  megestrol (MEGACE) 40 MG tablet Take 1 tablet (40 mg total) by mouth daily. 06/12/18   Shirley Friar, PA-C  multivitamin (RENA-VIT) TABS tablet Take 1 tablet by mouth at bedtime. 05/19/18   Shirley Friar, PA-C  pantoprazole (PROTONIX) 40 MG tablet Take 1 tablet (40 mg total) by mouth daily. 06/18/18   Larey Dresser, MD  polyethylene glycol [facility] / Floria Raveling) packet Take 17 g by mouth daily as needed for mild constipation. Patient not taking: Reported on 06/18/2018 05/19/18   Shirley Friar, PA-C  senna-docusate (SENOKOT-S) 8.6-50 MG tablet Take 1 tablet by mouth 2 (two) times  daily. Patient not taking: Reported on 06/18/2018 05/05/18   Shirley Friar, PA-C  sildenafil (REVATIO) 20 MG tablet Take 1 tablet (20 mg total) by mouth 3 (three) times daily. 05/19/18   Love, Ivan Anchors, PA-C  traMADol (ULTRAM) 50 MG tablet Take 1 tablet (50 mg total) by mouth every 6 (six) hours as needed. 06/18/18   Larey Dresser, MD  traZODone (DESYREL) 50 MG tablet Take 2 tablets (100 mg total) by mouth at bedtime. 06/18/18   Larey Dresser, MD  warfarin (COUMADIN) 5 MG tablet Take 1 tablet (5 mg) daily except 1/2 tablet (2.5 mg) on Tuesday    [provider]    Past Medical History: Past Medical History:  Diagnosis Date  .  AICD (automatic cardioverter/defibrillator) present   . Asthma   . AVM (arteriovenous malformation) of colon 07/07/2017  . CHF (congestive heart failure) ([place])   . Diabetes ([place])   . ESRF (end stage renal failure) ([place]) 07/07/2017   pt receiving hemodialysis Monday- Wednesday-friday, sometimes Saturday  . Kidney disease   . LVAD (left ventricular assist device) present ([place]) 12/2016  . Permanent atrial fibrillation ([place]) 07/07/2017  . Presence of permanent cardiac pacemaker    AICD  . Sleep apnea     Past Surgical History: Past Surgical History:  Procedure Laterality Date  . AORTIC VALVE REPLACEMENT  2015   [place]  . APPLICATION OF A-CELL OF EXTREMITY Left 11/20/2017   Procedure: APPLICATION OF A-CELL;  Surgeon: Wallace Going, DO;  Location: [place];  Service: Plastics;  Laterality: Left;  . AV FISTULA PLACEMENT Left 06/30/2017   Procedure: CREATION of LEFT ARM Brachiocephalic Fistula;  Surgeon: Conrad [place], MD;  Location: [facility];  Service: Vascular;  Laterality: Left;  . CARDIAC CATHETERIZATION N/A 12/02/2016   Procedure: Right Heart Cath;  Surgeon: Larey Dresser, MD;  Location: [place] [facility];  Service: Cardiovascular;  Laterality: N/A;  . COLONOSCOPY N/A 02/14/2017   Procedure: COLONOSCOPY;  Surgeon: Milus Banister, MD;   Location: [place];  Service: Endoscopy;  Laterality: N/A;  . COLONOSCOPY WITH PROPOFOL N/A 02/27/2018   Procedure: COLONOSCOPY WITH PROPOFOL;  Surgeon: Jerene Bears, MD;  Location: [place];  Service: Gastroenterology;  Laterality: N/A;  . CORONARY ANGIOPLASTY WITH STENT PLACEMENT  2013   in [place]  . CORONARY ARTERY BYPASS GRAFT  2010   in [place]  . DIALYSIS/PERMA CATHETER INSERTION  06/30/2017   Procedure: INSERTION Dialysis Catheter;  Surgeon: Conrad , MD;  Location: [facility] OR;  Service: Vascular;;  . ESOPHAGOGASTRODUODENOSCOPY (EGD) WITH PROPOFOL N/A 02/27/2018   Procedure: ESOPHAGOGASTRODUODENOSCOPY (EGD) WITH PROPOFOL;  Surgeon: Jerene Bears, MD;  Location: [facility] ENDOSCOPY;  Service: Gastroenterology;  Laterality: N/A;  . GIVENS CAPSULE STUDY N/A 02/27/2018   Procedure: GIVENS CAPSULE STUDY;  Surgeon: Jerene Bears, MD;  Location: [place];  Service: Gastroenterology;  Laterality: N/A;  . GIVENS CAPSULE STUDY N/A 03/25/2018   Procedure: GIVENS CAPSULE STUDY;  Surgeon: Jackquline Denmark, MD;  Location: [facility] ENDOSCOPY;  Service: Endoscopy;  Laterality: N/A;  . IABP INSERTION N/A 12/11/2016   Procedure: IABP Insertion;  Surgeon: Larey Dresser, MD;  Location: [place] [facility];  Service: Cardiovascular;  Laterality: N/A;  . INSERTION OF IMPLANTABLE LEFT VENTRICULAR ASSIST DEVICE N/A 12/13/2016   Procedure: INSERTION OF IMPLANTABLE LEFT VENTRICULAR ASSIST DEVICE;  Surgeon: Ivin Poot, MD;  Location: [place];  Service: Open Heart Surgery;  Laterality: N/A;  HEARTMATE II  NITRIC OXIDE  . IR FLUORO GUIDE CV LINE LEFT  06/17/2017  . IR US GUIDE VASC ACCESS LEFT  06/17/2017  . MASS EXCISION Left 11/20/2017   Procedure: EXCISION OF LEFT NECK AND RIGHT LEG SKIN CANCER WITH A CELL PLACEMENT;  Surgeon: Wallace Going, DO;  Location: [place];  Service: Plastics;  Laterality: Left;  Marland Kitchen MASS EXCISION Left 11/26/2017   Procedure: EXPLORATION AND EXCISION OF LEFT NECK;  Surgeon: Wallace Going, DO;  Location: [place];  Service: Plastics;  Laterality: Left;  . RIGHT HEART CATH N/A 12/11/2016   Procedure: Right Heart Cath;  Surgeon: Larey Dresser, MD;  Location: [place] [facility];  Service: Cardiovascular;  Laterality: N/A;  . RIGHT HEART CATH N/A 03/27/2017  Procedure: Right Heart Cath;  Surgeon: Larey Dresser, MD;  Location: [place] [facility];  Service: Cardiovascular;  Laterality: N/A;  . RIGHT HEART CATH N/A 06/13/2017   Procedure: RIGHT HEART CATH;  Surgeon: Jolaine Artist, MD;  Location: [place] [facility];  Service: Cardiovascular;  Laterality: N/A;  . TEE WITHOUT CARDIOVERSION N/A 12/13/2016   Procedure: TRANSESOPHAGEAL ECHOCARDIOGRAM (TEE);  Surgeon: Ivin Poot, MD;  Location: [place];  Service: Open Heart Surgery;  Laterality: N/A;  . TRICUSPID VALVE REPLACEMENT N/A 12/13/2016   Procedure: TRICUSPID VALVE REPAIR WITH EDWARDS MC 3 TRICUSPID ANNULOPLASTY RING MODEL 4900 SIZE T 28;  Surgeon: Ivin Poot, MD;  Location: [facility];  Service: Open Heart Surgery;  Laterality: N/A;    Family History: Family History  Problem Relation Age of Onset  . Heart failure Father   . Heart attack Father   . Healthy Mother   . Diabetes Paternal Grandfather     Social History: Social History   Socioeconomic History  . Marital status: Widowed    Spouse name: Not on file  . Number of children: 0  . Years of education: 61  . Highest education level: Not on file  Occupational History  . Not on file  Social Needs  . Financial resource strain: Not on file  . Food insecurity:    Worry: Not on file    Inability: Not on file  . Transportation needs:    Medical: Not on file    Non-medical: Not on file  Tobacco Use  . Smoking status: Never Smoker  . Smokeless tobacco: Never Used  Substance and Sexual Activity  . Alcohol use: No  . Drug use: No  . Sexual activity: Not Currently  Lifestyle  . Physical activity:    Days per week: Not on file    Minutes per session: Not on  file  . Stress: Not on file  Relationships  . Social connections:    Talks on phone: Not on file    Gets together: Not on file    Attends religious service: Not on file    Active member of club or organization: Not on file    Attends meetings of clubs or organizations: Not on file    Relationship status: Not on file  Other Topics Concern  . Not on file  Social History Narrative   Patient is a widow. Moved to [place] from [place], [place]. Currently lives with his sister Pilar Corrales.    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     Allergies:  No Known Allergies  Objective:    Vital Signs:   Temp:  [98.8 F (37.1 C)] 98.8 F (37.1 C) (08/19 0827) Pulse Rate:  [82] 82 (08/19 0845) Resp:  [16] 16 (08/19 0845) BP: (97-104)/(84-86) 97/86 (08/19 0845) SpO2:  [100 %] 100 % (08/19 0845)   Mean arterial Pressure 80  Physical Exam    General:  Chronically ill appearing. Somewhat uncomfortable.  HEENT: Normal Neck: supple. JVP ~10 cm. Carotids 2+ bilat; no bruits. No lymphadenopathy or thyromegaly appreciated. Cor: Mechanical heart sounds with LVAD hum present. Lungs: Mildly diminished basilar sounds.  Abdomen: Soft, but mildly tender diffusely. No rigidity or rebound tenderness. nondistended. No hepatosplenomegaly. No bruits or masses. Mildly increased bowel sounds.  Driveline: C/D/I; securement device intact and driveline incorporated Extremities: no cyanosis, clubbing, or rash. Trace to 1+ edema. Neuro: alert & orientedx3, cranial nerves grossly intact. moves all 4 extremities w/o difficulty. Affect  pleasant  Telemetry   AF (chronic) 80s, personally reviewed.  EKG   Pending.   Labs    Basic Metabolic Panel: Recent Labs  Lab 06/18/18 1054  NA 136  K 3.8  CL 98  CO2 28  GLUCOSE 178*  BUN 26*  CREATININE 2.46*  CALCIUM 9.0    Liver Function Tests: No results for input(s): AST, ALT, ALKPHOS, BILITOT, PROT, ALBUMIN in the last 168 hours. No  results for input(s): LIPASE, AMYLASE in the last 168 hours. No results for input(s): AMMONIA in the last 168 hours.  CBC: Recent Labs  Lab 06/18/18 1054  WBC 13.8*  HGB 10.1*  HCT 33.9*  MCV 95.0  PLT 169    Cardiac Enzymes: No results for input(s): CKTOTAL, CKMB, CKMBINDEX, TROPONINI in the last 168 hours.  BNP: BNP (last 3 results) Recent Labs    05/25/18 1905  BNP 1,084.4*    ProBNP (last 3 results) No results for input(s): PROBNP in the last 8760 hours.   CBG: No results for input(s): GLUCAP in the last 168 hours.  Coagulation Studies: No results for input(s): LABPROT, INR in the last 72 hours.  Other results:  Imaging    Dg Chest Portable 1 View  Result Date: 06/22/2018 CLINICAL DATA:  Acute onset shortness of breath with center chest tightness. Chest congestion. EXAM: PORTABLE CHEST 1 VIEW COMPARISON:  06/08/2018 and CT chest 11/25/2017. FINDINGS: Trachea is midline. Heart size stable. Left ventricular assist device is in place. Thoracic aorta is calcified. Right-sided ICD lead projects over the right ventricle. Lungs are somewhat low in volume with an elevated left hemidiaphragm. No airspace consolidation or pleural fluid. IMPRESSION: 1. No acute findings. 2.  Aortic atherosclerosis (ICD10-170.0). Electronically Signed   By: Lorin Picket M.D.   On: 06/22/2018 08:43     Patient Profile:   Trevor [name] is a 72 y.o. 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   Assessment/Plan:    1. Abdominal Pain - No obstructive pattern on ABD films. Large stool burden.  - ? Combination of volume, constipation, and doxycycline.  - WBC pending.  2. Chronic systolic CHF:  Ischemic cardiomyopathy - EF 20-25% with RV dysfunction on 2/18 echo pre-LVAD.  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  Medtronic ICD.  He has had problems with RV failure post-op.  He developed worsening renal dysfunction and finally had to start HD in 8/18. There has been some difficulty getting adequate fluid off him, currently dialyzing 4 days/week.  He was recently admitted with volume overload and had HD several days in a row.  Per inpatient renal consult, planned to lower dry weight but this does not seem to have been carried over to his outpatient HD. NYHA III symptoms, stable. He does appear volume overloaded still.  - We will contact his dialysis center about lowering his dry weight.  Suspect he lost muscle mass during recent prolonged hospitalization and will need a lower dry weight.  - Continue warfarin with INR goal 1.8 - 2.3.  No ASA with GI bleeding.  3. CAD: s/p CABG.  - No s/s of ischemia.    - He is on atorvastatin.  4. ESRD: -  Ongoing HD 4  days/week, see above.  5. Atrial fibrillation: Chronic.   - On warfarin. 6. RV failure:  - Continue Revatio at 40 mg tid.   7. HTN:  - MAP stable.  8. 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  - Hgb 10.1 06/18/18. No bleeding. Pending this am.  9. GI bleeding: GI AVMs. Now s/p laparoscopic surgical enteroscopy with 4 clips on 6/7/19at [name], c/b ischemic bowel/SBO. No evidence currently for overt bleeding. Hgb 10.1 today. No BRBPR/melena.  - Tolerating warfarin. Goal INR 1.8-2.3, adjust to keep in this range.  - Getting Aranesp per nephrology.  - Continue octreotide  10. Squamous cell skin cancer:  Neck excision site without clear margins.  He saw Dr Lisbeth Renshaw with radiation oncology, radiation was recommended.  He wants to hold off for the time being, does not think he can handle radiation as HD wears him out 4 days/week. He now has a nodule concerning for skin cancer on his forehead, will refer  him back to [name].   65. H/o TAVR:  - Stable on last echo.  12. Septic Left Knee: s/p I&D and surgical wash out at [facility] - Getting PT.  - Needs ortho followup.  13. ID:  - Started on doxycycline 100 mg bid x 7 days 06/18/18.  VAD parameters stable and he is near his baseline apart from abdominal pain. Work up so far with nothing acute. Suspect multifactorial. Will await labwork and discuss with MD. No immediate plans to admit patient.  VAD coordinator to discuss with HD center about rescheduling this am session.    Addendum: Pt felt better after enema and large BM. Will return home and call back with any further symptoms. Have arranged for him to make up HD tomorrow afternoon at 1200.   I reviewed the LVAD parameters from today, and compared the results to the patient's prior recorded data.  No programming changes were made.  The LVAD is functioning within specified parameters.  The patient performs LVAD self-test daily.  LVAD interrogation was negative for any significant power changes, alarms or PI events/speed drops.  LVAD equipment check completed and is in good working order.  Back-up equipment present.   LVAD education done on emergency procedures and precautions and reviewed exit site care.  Length of Stay: 0  Annamaria Helling 06/22/2018, 8:58 AM  VAD Team Pager [phone] (7am - 7am) +++VAD ISSUES ONLY+++   Advanced Heart Failure Team Pager [phone] (M-F; [place])  Please contact [facility] for night-coverage after hours (4p -7a ) and weekends on [email or web address] for all non- LVAD Issues  Patient seen and examined with the above-signed Advanced Practice Provider and/or Housestaff. I personally reviewed laboratory data, imaging studies and relevant notes. I independently examined the patient and formulated the important aspects of the plan. I have edited the note to reflect any of my changes or salient points. I have personally discussed the plan with the patient  and/or family.  Agree. See more recent H&P from this evening for full details.   Glori Bickers, MD  10:51 PM

## 2018-06-22 NOTE — Progress Notes (Signed)
ANTICOAGULATION CONSULT NOTE - Initial Consult  Pharmacy Consult for heparin/Warfarin Indication: LVAD  No Known Allergies  Patient Measurements:   Heparin Dosing Weight: 75 kg  Vital Signs: Temp: 98.8 F (37.1 C) (08/19 0827) Temp Source: Oral (08/19 0827) BP: 84/67 (08/19 1331) Pulse Rate: 107 (08/19 1340)  Labs: Recent Labs    06/22/18 0849  HGB 9.2*  HCT 30.8*  PLT 159  APTT 31  LABPROT 16.8*  INR 1.37  CREATININE 2.83*    Estimated Creatinine Clearance: 22.4 mL/min (A) (by C-G formula based on SCr of 2.83 mg/dL (H)).   Medical History: Past Medical History:  Diagnosis Date  . AICD (automatic cardioverter/defibrillator) present   . Asthma   . AVM (arteriovenous malformation) of colon 07/07/2017  . CHF (congestive heart failure) ([place])   . Diabetes ([place])   . ESRF (end stage renal failure) ([place]) 07/07/2017   pt receiving hemodialysis Monday- Wednesday-friday, sometimes Saturday  . Kidney disease   . LVAD (left ventricular assist device) present ([place]) 12/2016  . Permanent atrial fibrillation ([place]) 07/07/2017  . Presence of permanent cardiac pacemaker    AICD  . Sleep apnea      Assessment: 74 yoM with LVAD and significant hx of GIB presents with abdominal pain and subtherapeutic INR. Pharmacy consulted to resume warfarin and start IV heparin bridge while INR low. INR 1.37 in clinic, PTA dose adjusted to 7.5mg  x1 then 5mg  daily. Pt reports taking warfarin today prior to being admitted.  Goal of Therapy:  Heparin level 0.3-0.5 units/ml INR 1.8-2.4 Monitor platelets by anticoagulation protocol: Yes   Plan:  -No warfarin tonight (pt has taken already) -Start IV heparin 900 units/hr -Check 8hr heparin level -Daily heparin level, CBC, LDH, PT/INR  Arrie Senate, PharmD, BCPS Clinical Pharmacist [phone] Please check AMION for all [place] numbers 06/22/2018

## 2018-06-22 NOTE — ED Provider Notes (Signed)
[facility] DEPARTMENT Provider Note   CSN: [id number] Arrival date & time: 06/22/18  0803     History   Chief Complaint Chief Complaint  Patient presents with  . Abdominal Pain    HPI Trevor [name] is a 72 y.o. male.  Patient is a 72 year old male with multiple medical problems including LVAD, end-stage renal disease on dialysis 4 days a week, severe GI bleeding in the past, diabetes, asthma who is presenting today with abdominal pain and shortness of breath.  Patient states he was not able to have much of a bowel movement yesterday and when he woke up this morning he was short of breath, abdominal pain and cramping, unable to pass gas or have a bowel movement.  He did go to dialysis but only received 30 minutes.  He has had a mild cough today but denies any productive cough.  He has not had any change in the color of his stool.  His abdomen hurts over the lower part of the abdomen.  He has had some mild nausea but denies any vomiting.  He does not have any chest pain he is still taking Coumadin and came by EMS from dialysis with his left upper extremity graft still accessed..  The history is provided by the patient.    Past Medical History:  Diagnosis Date  . AICD (automatic cardioverter/defibrillator) present   . Asthma   . AVM (arteriovenous malformation) of colon 07/07/2017  . CHF (congestive heart failure) ([place])   . Diabetes ([place])   . ESRF (end stage renal failure) ([place]) 07/07/2017   pt receiving hemodialysis Monday- Wednesday-friday, sometimes Saturday  . Kidney disease   . LVAD (left ventricular assist device) present ([place]) 12/2016  . Permanent atrial fibrillation ([place]) 07/07/2017  . Presence of permanent cardiac pacemaker    AICD  . Sleep apnea     Patient Active Problem List   Diagnosis Date Noted  . Subtherapeutic international normalized ratio (INR)   . Arterial hypotension   . Leukocytosis   . Acute blood loss anemia   . History of  GI bleed   . Physical debility 05/05/2018  . Debility   . Diabetes mellitus type 2 in nonobese (HCC)   . ESRD on dialysis ([place])   . Pyogenic arthritis of left knee joint ([place])   . Sleep disturbance   . Generalized anxiety disorder   . Ileus ([place])   . Arteriovenous malformation   . GI bleed 03/09/2018  . Small bowel bleed not requiring more than 4 units of blood in 24 hours, ICU, or surgery   . Heme positive stool   . Cellulitis 12/03/2017  . 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 Chronic systolic CHF (congestive heart failure) ([place]) 09/08/2017  . ESRD (end stage renal disease) ([place]) 07/07/2017  . AVM (arteriovenous malformation) of colon 07/07/2017  . Thrombocytopenia ([place]) 07/07/2017  . Permanent atrial fibrillation ([place]) 07/07/2017  . Anemia of chronic disease 07/07/2017  . Acute on chronic systolic (congestive) heart failure ([place]) 03/27/2017  . AVM (arteriovenous malformation) of colon with hemorrhage   . Symptomatic anemia 02/10/2017  . Type 2 diabetes mellitus ([place]) 01/17/2017  . COPD (chronic obstructive pulmonary disease) ([place]) 01/17/2017  . LVAD (left ventricular assist device) present ([place]) 01/09/2017  . Presence of left ventricular assist device (LVAD) ([place])   . Palliative care encounter   . Cardiorenal syndrome with renal failure 11/28/2016  . Cardiomyopathy, ischemic 11/27/2016  .  Coronary artery disease 11/27/2016  . Status post aortic valve replacement 11/27/2016  . Asthma, chronic 11/27/2016  . OSA (obstructive sleep apnea) 11/27/2016    Past Surgical History:  Procedure Laterality Date  . AORTIC VALVE REPLACEMENT  2015   [place]  . APPLICATION OF A-CELL OF EXTREMITY Left 11/20/2017   Procedure: APPLICATION OF A-CELL;  Surgeon: Wallace Going, DO;  Location: [facility];  Service: Plastics;  Laterality: Left;  . AV FISTULA PLACEMENT Left 06/30/2017   Procedure: CREATION of LEFT ARM Brachiocephalic Fistula;  Surgeon: Conrad Indiantown, MD;  Location: [facility];  Service: Vascular;  Laterality: Left;  . CARDIAC CATHETERIZATION N/A 12/02/2016   Procedure: Right Heart Cath;  Surgeon: Larey Dresser, MD;  Location: [facility];  Service: Cardiovascular;  Laterality: N/A;  . COLONOSCOPY N/A 02/14/2017   Procedure: COLONOSCOPY;  Surgeon: Milus Banister, MD;  Location: [facility];  Service: Endoscopy;  Laterality: N/A;  . COLONOSCOPY WITH PROPOFOL N/A 02/27/2018   Procedure: COLONOSCOPY WITH PROPOFOL;  Surgeon: Jerene Bears, MD;  Location: [facility];  Service: Gastroenterology;  Laterality: N/A;  . CORONARY ANGIOPLASTY WITH STENT PLACEMENT  2013   in [place]  . CORONARY ARTERY BYPASS GRAFT  2010   in [place]  . DIALYSIS/PERMA CATHETER INSERTION  06/30/2017   Procedure: INSERTION Dialysis Catheter;  Surgeon: Conrad Clyman, MD;  Location: [facility] OR;  Service: Vascular;;  . ESOPHAGOGASTRODUODENOSCOPY (EGD) WITH PROPOFOL N/A 02/27/2018   Procedure: ESOPHAGOGASTRODUODENOSCOPY (EGD) WITH PROPOFOL;  Surgeon: Jerene Bears, MD;  Location: [facility] ENDOSCOPY;  Service: Gastroenterology;  Laterality: N/A;  . GIVENS CAPSULE STUDY N/A 02/27/2018   Procedure: GIVENS CAPSULE STUDY;  Surgeon: Jerene Bears, MD;  Location: [facility];  Service: Gastroenterology;  Laterality: N/A;  . GIVENS CAPSULE STUDY N/A 03/25/2018   Procedure: GIVENS CAPSULE STUDY;  Surgeon: Jackquline Denmark, MD;  Location: [facility] ENDOSCOPY;  Service: Endoscopy;  Laterality: N/A;  . IABP INSERTION N/A 12/11/2016   Procedure: IABP Insertion;  Surgeon: Larey Dresser, MD;  Location: [facility];  Service: Cardiovascular;  Laterality: N/A;  . INSERTION OF IMPLANTABLE LEFT VENTRICULAR ASSIST DEVICE N/A 12/13/2016   Procedure: INSERTION OF IMPLANTABLE LEFT VENTRICULAR ASSIST DEVICE;  Surgeon: Ivin Poot, MD;  Location: [facility];  Service: Open Heart Surgery;  Laterality: N/A;  HEARTMATE II  NITRIC OXIDE  . IR FLUORO GUIDE CV LINE LEFT  06/17/2017  . IR US GUIDE VASC ACCESS LEFT  06/17/2017  . MASS  EXCISION Left 11/20/2017   Procedure: EXCISION OF LEFT NECK AND RIGHT LEG SKIN CANCER WITH A CELL PLACEMENT;  Surgeon: Wallace Going, DO;  Location: [facility];  Service: Plastics;  Laterality: Left;  Marland Kitchen MASS EXCISION Left 11/26/2017   Procedure: EXPLORATION AND EXCISION OF LEFT NECK;  Surgeon: Wallace Going, DO;  Location: [facility];  Service: Plastics;  Laterality: Left;  . RIGHT HEART CATH N/A 12/11/2016   Procedure: Right Heart Cath;  Surgeon: Larey Dresser, MD;  Location: [facility];  Service: Cardiovascular;  Laterality: N/A;  . RIGHT HEART CATH N/A 03/27/2017   Procedure: Right Heart Cath;  Surgeon: Larey Dresser, MD;  Location: [facility];  Service: Cardiovascular;  Laterality: N/A;  . RIGHT HEART CATH N/A 06/13/2017   Procedure: RIGHT HEART CATH;  Surgeon: Jolaine Artist, MD;  Location: [facility];  Service: Cardiovascular;  Laterality: N/A;  . TEE WITHOUT CARDIOVERSION N/A 12/13/2016   Procedure: TRANSESOPHAGEAL ECHOCARDIOGRAM (  TEE);  Surgeon: Ivin Poot, MD;  Location: [facility];  Service: Open Heart Surgery;  Laterality: N/A;  . TRICUSPID VALVE REPLACEMENT N/A 12/13/2016   Procedure: TRICUSPID VALVE REPAIR WITH EDWARDS MC 3 TRICUSPID ANNULOPLASTY RING MODEL 4900 SIZE T 28;  Surgeon: Ivin Poot, MD;  Location: [facility];  Service: Open Heart Surgery;  Laterality: N/A;        Home Medications    Prior to Admission medications   Medication Sig Start Date End Date Taking? Authorizing Provider  acetaminophen (TYLENOL) 325 MG tablet Take 2 tablets (650 mg total) by mouth every 4 (four) hours as needed for headache or mild pain. 03/10/18   Shirley Friar, PA-C  albuterol (PROVENTIL HFA;VENTOLIN HFA) 108 (90 Base) MCG/ACT inhaler Inhale 2 puffs into the lungs every 4 (four) hours as needed for wheezing or shortness of breath. 02/10/18   Larey Dresser, MD  albuterol (PROVENTIL) (2.5 MG/3ML) 0.083% nebulizer solution Take 2.5 mg by nebulization every 4  (four) hours as needed for wheezing or shortness of breath.    [provider]  atorvastatin (LIPITOR) 40 MG tablet Take 1 tablet (40 mg total) by mouth daily at 6 PM. 06/18/18   Larey Dresser, MD  benzonatate (TESSALON) 100 MG capsule Take 1 capsule (100 mg total) by mouth 3 (three) times daily as needed for cough. 06/12/18   Shirley Friar, PA-C  busPIRone (BUSPAR) 5 MG tablet Take 1 tablet (5 mg total) by mouth 2 (two) times daily. 01/22/18   Bensimhon, Shaune Pascal, MD  calcitRIOL (ROCALTROL) 0.5 MCG capsule Take 1 capsule (0.5 mcg total) by mouth every Monday, Wednesday, and Friday with hemodialysis. 03/11/18   Shirley Friar, PA-C  Darbepoetin Alfa (ARANESP) 200 MCG/0.4ML SOSY injection Inject 0.4 mLs (200 mcg total) into the vein every Monday with hemodialysis. 04/06/18   Clegg, Amy D, NP  docusate sodium (COLACE) 250 MG capsule Take 1 capsule (250 mg total) by mouth daily. 06/04/18   Larey Dresser, MD  doxycycline (VIBRAMYCIN) 100 MG capsule Take 1 capsule (100 mg total) by mouth 2 (two) times daily for 7 days. 06/18/18 06/25/18  Larey Dresser, MD  fluticasone (FLOVENT HFA) 110 MCG/ACT inhaler Inhale 2 puffs into the lungs 2 (two) times daily. 02/10/18   Larey Dresser, MD  hydrocerin (EUCERIN) CREA Apply 1 application topically 2 (two) times daily. Patient taking differently: Apply 1 application topically 2 (two) times daily as needed (dry skin).  05/19/18   Shirley Friar, PA-C  insulin glargine (LANTUS) 100 UNIT/ML injection Use 4 units in morning and 6 units at bedtime. Patient taking differently: Inject 5 Units into the skin 2 (two) times daily.  05/19/18   Love, Ivan Anchors, PA-C  levothyroxine (SYNTHROID, LEVOTHROID) 25 MCG tablet Take 1 tablet (25 mcg total) by mouth daily before breakfast. 10/02/17   Larey Dresser, MD  LORazepam (ATIVAN) 0.5 MG tablet Take 1 tablet (0.5 mg total) by mouth every 8 (eight) hours as needed for anxiety (for nausea or anxiety).  06/18/18   Larey Dresser, MD  megestrol (MEGACE) 40 MG tablet Take 1 tablet (40 mg total) by mouth daily. 06/12/18   Shirley Friar, PA-C  multivitamin (RENA-VIT) TABS tablet Take 1 tablet by mouth at bedtime. 05/19/18   Shirley Friar, PA-C  pantoprazole (PROTONIX) 40 MG tablet Take 1 tablet (40 mg total) by mouth daily. 06/18/18   Larey Dresser, MD  polyethylene glycol [facility] / Floria Raveling) packet  Take 17 g by mouth daily as needed for mild constipation. Patient not taking: Reported on 06/18/2018 05/19/18   Shirley Friar, PA-C  senna-docusate (SENOKOT-S) 8.6-50 MG tablet Take 1 tablet by mouth 2 (two) times daily. Patient not taking: Reported on 06/18/2018 05/05/18   Shirley Friar, PA-C  sildenafil (REVATIO) 20 MG tablet Take 1 tablet (20 mg total) by mouth 3 (three) times daily. 05/19/18   Love, Ivan Anchors, PA-C  traMADol (ULTRAM) 50 MG tablet Take 1 tablet (50 mg total) by mouth every 6 (six) hours as needed. 06/18/18   Larey Dresser, MD  traZODone (DESYREL) 50 MG tablet Take 2 tablets (100 mg total) by mouth at bedtime. 06/18/18   Larey Dresser, MD  warfarin (COUMADIN) 5 MG tablet Take 5 mg by mouth daily.     [provider]    Family History Family History  Problem Relation Age of Onset  . Heart failure Father   . Heart attack Father   . Healthy Mother   . Diabetes Paternal Grandfather     Social History Social History   Tobacco Use  . Smoking status: Never Smoker  . Smokeless tobacco: Never Used  Substance Use Topics  . Alcohol use: No  . Drug use: No     Allergies   Patient has no known allergies.   Review of Systems Review of Systems  All other systems reviewed and are negative.    Physical Exam Updated Vital Signs BP 96/83   Pulse 82   Temp 98.8 F (37.1 C) (Oral)   Resp 18   SpO2 99%   Physical Exam  Constitutional: He is oriented to person, place, and time. He appears well-developed and well-nourished. No  distress.  HENT:  Head: Normocephalic and atraumatic.  Mouth/Throat: Oropharynx is clear and moist.  Eyes: Pupils are equal, round, and reactive to light. Conjunctivae and EOM are normal.  Neck: Normal range of motion. Neck supple.  Cardiovascular:  Rhythmic, of the device auscultated  Pulmonary/Chest: Effort normal. No respiratory distress. He has no wheezes. He has rales in the left middle field and the left lower field.  Abdominal: Soft. He exhibits no distension. Bowel sounds are decreased. There is tenderness in the right lower quadrant, suprapubic area and left lower quadrant. There is guarding. There is no rebound.    Musculoskeletal: Normal range of motion. He exhibits no edema or tenderness.  Neurological: He is alert and oriented to person, place, and time.  Skin: Skin is warm and dry. No rash noted. No erythema. There is pallor.  Psychiatric: He has a normal mood and affect. His behavior is normal.  Nursing note and vitals reviewed.    ED Treatments / Results  Labs (all labs ordered are listed, but only abnormal results are displayed) Labs Reviewed  CBC WITH DIFFERENTIAL/PLATELET - Abnormal; Notable for the following components:      Result Value   RBC 3.26 (*)    Hemoglobin 9.2 (*)    HCT 30.8 (*)    MCHC 29.9 (*)    RDW 18.2 (*)    Neutro Abs 8.6 (*)    All other components within normal limits  COMPREHENSIVE METABOLIC PANEL - Abnormal; Notable for the following components:   Chloride 97 (*)    Glucose, Bld 326 (*)    BUN 30 (*)    Creatinine, Ser 2.83 (*)    Calcium 8.6 (*)    Total Protein 6.4 (*)    Albumin 3.1 (*)  GFR calc non Af Amer 21 (*)    GFR calc Af Amer 24 (*)    All other components within normal limits  PROTIME-INR - Abnormal; Notable for the following components:   Prothrombin Time 16.8 (*)    All other components within normal limits  LACTATE DEHYDROGENASE - Abnormal; Notable for the following components:   LDH 262 (*)    All other  components within normal limits  APTT  I-STAT TROPONIN, ED  TYPE AND SCREEN    EKG EKG Interpretation  Date/Time:  Monday June 22 2018 08:18:19 EDT Ventricular Rate:  95 PR Interval:    QRS Duration: 156 QT Interval:  380 QTC Calculation: 478 R Axis:   -118 Text Interpretation:  Age not entered, assumed to be  72 years old for purpose of ECG interpretation Atrial fibrillation Right bundle branch block Artifact in lead(s) II III aVR aVL aVF V3 V4 V5 V6 and baseline wander in lead(s) II III aVF No significant change since last tracing Confirmed by Blanchie Dessert [phone]) on 06/22/2018 10:28:39 AM   Radiology Dg Chest Portable 1 View  Result Date: 06/22/2018 CLINICAL DATA:  Acute onset shortness of breath with center chest tightness. Chest congestion. EXAM: PORTABLE CHEST 1 VIEW COMPARISON:  06/08/2018 and CT chest 11/25/2017. FINDINGS: Trachea is midline. Heart size stable. Left ventricular assist device is in place. Thoracic aorta is calcified. Right-sided ICD lead projects over the right ventricle. Lungs are somewhat low in volume with an elevated left hemidiaphragm. No airspace consolidation or pleural fluid. IMPRESSION: 1. No acute findings. 2.  Aortic atherosclerosis (ICD10-170.0). Electronically Signed   By: Lorin Picket M.D.   On: 06/22/2018 08:43   Dg Abd Portable 1 View  Result Date: 06/22/2018 CLINICAL DATA:  Abdominal pain.  Constipation. EXAM: PORTABLE ABDOMEN - 1 VIEW COMPARISON:  Plain films of the abdomen 03/29/2018. FINDINGS: The bowel gas pattern is nonobstructive. There is a large volume of stool throughout the colon. No abnormal abdominal calcification is seen. Marked convex left thoracolumbar scoliosis is noted. Left ventricular assist device and AICD lead are noted. IMPRESSION: No acute finding. Moderately large colonic stool burden. Marked convex left scoliosis. Electronically Signed   By: Inge Rise M.D.   On: 06/22/2018 09:01    Procedures Procedures  (including critical care time)  Medications Ordered in ED Medications  albuterol (PROVENTIL) (2.5 MG/3ML) 0.083% nebulizer solution 5 mg (has no administration in time range)  ondansetron (ZOFRAN) injection 4 mg (4 mg Intravenous Given 06/22/18 0913)  fentaNYL (SUBLIMAZE) injection 25 mcg (25 mcg Intravenous Given 06/22/18 0915)     Initial Impression / Assessment and Plan / ED Course  I have reviewed the triage vital signs and the nursing notes.  Pertinent labs & imaging results that were available during my care of the patient were reviewed by me and considered in my medical decision making (see chart for details).     Elderly male with multiple medical problems presenting today with shortness of breath and abdominal pain.  Patient is an LVAD patient and end-stage renal disease and dialyzes 4 days a week.  He did go to dialysis today but only received 30 minutes.  Patient does not appear severely fluid overloaded today.  He does have decreased breath sounds and pain across his lower abdomen.  Patient complains of being constipated yesterday and today.  He states he has not been able to have a bowel movement this morning and not passing gas.  He complains of some mild nausea.  He has no chest pain at this time.  Patient's EKG shows atrial fibrillation with a right bundle branch block which is unchanged.  Patient does have a history of extensive GI bleeding as well.  Will ensure he has no evidence of anemia he denies any bloody stool today.  Also will do a KUB to rule out obstruction.  KUB shows a large colonic stool burden, chest x-ray without acute findings.  Patient's hemoglobin is stable at 9 and INR is subtherapeutic at 1.38.  Patient is not hyperkalemic today.  LDH is persistently elevated but not significantly different.  Will discuss with LVAD team on further recommendations.  1:28 PM Fleets enema given and patient had a moderate sized bowel movement with improvement of his symptoms.  Poke  with the LVAD team who feel that it is appropriate that patient go home.  They have addressed his subtherapeutic INR and have him scheduled for hemodialysis tomorrow.  Final Clinical Impressions(s) / ED Diagnoses   Final diagnoses:  Constipation, unspecified constipation type  Abdominal pain, unspecified abdominal location    ED Discharge Orders    None       Blanchie Dessert, MD 06/22/18 1342

## 2018-06-22 NOTE — Progress Notes (Signed)
ED Note:  Pt called VAD pager to report he was at dialysis this am and became SOB and having abdominal "cramps". He says the center called EMS and he will be coming to ED. ED charge notified. VAD team notified.   VAD cart to room - pt awake, alert. ED doctor at bedside. Oda Kilts, PA from VAD team to room - assessing patient.    Vital signs: HR: 90 A-fib Automatic BP: 106/73 (83) Doppler: 104 O2 Sat: 100% RA  LVAD interrogation reveals:  Speed:  9200 Flow:  5.5 Power: 5.7 w PI: 5.7 Alarms: none Events: none Fixed speed: 9200 Low speed limit: 8600   Drive Line:  Right abd dressing CDI.     Anticoagulation Plan: - INR Goal: 1.8-2.3 - ASA Dose: none due to hx of GI bleed - OP monthly Octreotide for hx of GI bleed    Device: - Medtronic single lead -Therapies: on 231 bpm  Arrythmias: chronic afib  Renal:  - chronic HD on M/W/F/Sat at [facility]  Adverse Events on VAD: - 02/2017> GIB- AVM clipped x2 - 07/2017> renal failure- HD started - 11/2017>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 Margins not clear at neck. - 02/23/18> hospitalization for GI bleed. Colon/EGD 02/27/18 with normal EGD. Colonoscopy identified active bleeding in ileum; source not found. Capsule Endoscopy 02/27/18: Showed active bleeding in the ileum. Received 5 units blood with histamine reaction; will give benadryl as OP. - 03/10/18>transferrred to [facility] for double retro balloon scop and sent to IR emolization x 3 with no evidence of bleeding. Received 12 units PCs, remained off anticoagulants. Transferred back to [facility] 03/20/18 Seen by IR 5/20. CTA obtained, but no active bleeding found. Started on octreotide. 03/25/18: Repeat capsule endoscopy completed. Bleeding appears to come from the mid-ileum. 03/26/18: General surgery reconsulted. Recommended transfer back to  [facility] for surgery with intraoperative endoscopy. [name] currently discussing options. Ortho  consulted 5/24 for left knee pain. S/p knee aspiration and steroid injection.  Dose of premarin given 5/24.  On 5/25 developed abdominal bloating and distension with n/v. KUB suggestive of ileus versus early SBO. NGT placed for decompression.  Had large bloody BM after this and felt much better, diet now advanced to regular.  - 04/01/18 transferred to [facility] for  laparoscopic surgical enteroscopy for recurrent GI bleed    [name] RN, VAD Coordinator 24/7 VAD pager: [phone]

## 2018-06-22 NOTE — H&P (Addendum)
Advanced Heart Failure VAD History and Physical Note   PCP-Cardiologist: No primary care provider on file.   Reason for Visit: Acute SOB and Abdominal Pain.   HPI:    Trevor [name] is a 72 y.o. 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   Pt had a prolonged admission 03/2018 to 05/2018 for GI bleeding requiring innumerable units of RBCs.. Transferred to [name] for  surgical enterotomy and clipping of a colonic Dueilafoy lesion as well as I&D of septic left knee with washout in OR. He was brought back to [facility] for CIR on 04/30/18.  He has struggled to tolerate HD but has had mutiple ER visits and admissions for nausea and weakness. Discussed PD with Renal and surgical teams but felt not to be a candidate for catheter placement due to multiple ab surgeries.   He was admitted in 8/5 - 8/919 after HD with dyspnea and cough.  He was volume overloaded and required HD several days in a row. Cough and dyspnea had resolved by discharge.   Seen in LVAD clinic 06/18/18 and was feeling OK. Felt as if his dry weight had not been lowered at HD as planned. He denied dyspnea walking around house.   Pt initially presented to [facility] this am via EMS from HD. Started having SOB and abdominal pain approx 30 minutes into HD. His SOB is his "normal" response to HD, but the abdominal pain is new. He had a "small" bowel movement yesterday, but is not sure he has passed gas since then. Imaging in ER showed heavy stool burden.  Pt given enema and fentanyl with resolution of abdominal pain after very large BM, unfortunately, he returned home and gradually felt worse again, with decreased appetite along with nausea and vomiting.  With  ongoing issues, decided on direct admit to further evaluate and treat.   Pertinent admission labs: K 3.9, creatinine 2.83, LDH 262, WBC 10.2, hemoglobin 9.2, INR 1.37, troponin 0.06  ABD Portable 1V 06/22/18 - No acute finding. Moderately large colonic stool burden.   CXR 06/22/18 - No acute findings.   LVAD Interrogation HM 2: Speed: 9200 Flow: 5.5 PI: 5.7 Power: 5.7. No PI events  VAD interrogated personally. Parameters stable.   Review of Systems: [y] = yes, [ ]  = no   . General: Weight gain [y]; Weight loss [ ] ; Anorexia [ ] ; Fatigue [y]; Fever [ ] ; Chills [ ] ; Weakness [name] ]  . Cardiac: Chest pain/pressure [ ] ; Resting SOB [y]; Exertional SOB [y]; Orthopnea [y]; Pedal Edema [y]; Palpitations [ ] ; Syncope [ ] ; Presyncope [ ] ; Paroxysmal nocturnal dyspnea[ ]   . Pulmonary: Cough [ ] ; Wheezing[ ] ; Hemoptysis[ ] ; Sputum [ ] ; Snoring [ ]   . GI: Vomiting[y ]; Dysphagia[ ] ; Melena[ ] ; Hematochezia [ ] ; Heartburn[ ] ; Abdominal pain [y]; Constipation [ ] ; Diarrhea [ ] ; BRBPR [ ]   . GU: Hematuria[ ] ; Dysuria [ ] ; Nocturia[ ]   . Vascular: Pain in legs with walking [ ] ; Pain in feet with lying flat [ ] ; Non-healing sores [ ] ; Stroke [ ] ; TIA [ ] ; Slurred speech [ ] ;  . Neuro: Headaches[ ] ; Vertigo[ ] ; Seizures[ ] ; Paresthesias[ ] ;Blurred vision [ ] ; Diplopia [ ] ;  Vision changes [ ]   . Ortho/Skin: Arthritis [y]; Joint pain [y]; Muscle pain [ ] ; Joint swelling [ ] ; Back Pain [ ] ; Rash [ ]   . Psych: Depression[ ] ; Anxiety[ ]   . Heme: Bleeding problems [ ] ; Clotting disorders [ ] ; Anemia [ y]  . Endocrine: Diabetes [ ] ; Thyroid dysfunction[ ]     Home Medications Prior to Admission medications   Medication Sig Start Date End Date Taking? Authorizing Provider  acetaminophen (TYLENOL) 325 MG tablet Take 2 tablets (650 mg total) by mouth every 4 (four) hours as needed for headache or mild pain. 03/10/18   Shirley Friar, PA-C  albuterol (PROVENTIL HFA;VENTOLIN HFA) 108 (90 Base) MCG/ACT inhaler  Inhale 2 puffs into the lungs every 4 (four) hours as needed for wheezing or shortness of breath. 02/10/18   Larey Dresser, MD  albuterol (PROVENTIL) (2.5 MG/3ML) 0.083% nebulizer solution Take 2.5 mg by nebulization every 4 (four) hours as needed for wheezing or shortness of breath.    [provider]  atorvastatin (LIPITOR) 40 MG tablet Take 1 tablet (40 mg total) by mouth daily at 6 PM. 06/18/18   Larey Dresser, MD  benzonatate (TESSALON) 100 MG capsule Take 1 capsule (100 mg total) by mouth 3 (three) times daily as needed for cough. 06/12/18   Shirley Friar, PA-C  busPIRone (BUSPAR) 5 MG tablet Take 1 tablet (5 mg total) by mouth 2 (two) times daily. 01/22/18   Dreonna Hussein, Shaune Pascal, MD  calcitRIOL (ROCALTROL) 0.5 MCG capsule Take 1 capsule (0.5 mcg total) by mouth every Monday, Wednesday, and Friday with hemodialysis. 03/11/18   Shirley Friar, PA-C  Darbepoetin Alfa (ARANESP) 200 MCG/0.4ML SOSY injection Inject 0.4 mLs (200 mcg total) into the vein every Monday with hemodialysis. 04/06/18   Clegg, Amy D, NP  docusate sodium (COLACE) 250 MG capsule Take 1 capsule (250 mg total) by mouth daily. 06/04/18   Larey Dresser, MD  doxycycline (VIBRAMYCIN) 100 MG capsule Take 1 capsule (100 mg total) by mouth 2 (two) times daily for 7 days. 06/18/18 06/25/18  Larey Dresser, MD  fluticasone (FLOVENT HFA) 110 MCG/ACT inhaler Inhale 2 puffs into the lungs 2 (two) times daily. 02/10/18   Larey Dresser, MD  hydrocerin (EUCERIN) CREA Apply 1 application topically 2 (two) times daily. Patient taking differently: Apply 1 application topically 2 (two) times daily as needed (dry skin).  05/19/18   Shirley Friar, PA-C  insulin glargine (LANTUS) 100 UNIT/ML injection Use 4 units in morning and 6 units at bedtime. Patient taking differently: Inject 5 Units into the skin 2 (two) times daily.  05/19/18   Love, Ivan Anchors, PA-C  levothyroxine (SYNTHROID, LEVOTHROID) 25 MCG tablet Take 1  tablet (25 mcg total) by mouth daily before breakfast. 10/02/17   Larey Dresser, MD  LORazepam (ATIVAN) 0.5 MG tablet Take 1 tablet (0.5 mg total) by mouth every 8 (eight) hours as needed for anxiety (for nausea or anxiety). 06/18/18   Larey Dresser, MD  megestrol (MEGACE) 40 MG tablet Take 1 tablet (40 mg total) by mouth daily. 06/12/18   Shirley Friar, PA-C  multivitamin (RENA-VIT) TABS tablet Take 1 tablet by mouth at bedtime. 05/19/18   Shirley Friar, PA-C  pantoprazole (PROTONIX) 40 MG tablet Take 1 tablet (40 mg total) by mouth daily. 06/18/18   Larey Dresser, MD  polyethylene glycol [facility]) packet Take 17 g by mouth daily as needed for mild constipation. Patient  not taking: Reported on 06/18/2018 05/19/18   Shirley Friar, PA-C  senna-docusate (SENOKOT-S) 8.6-50 MG tablet Take 1 tablet by mouth 2 (two) times daily. Patient not taking: Reported on 06/18/2018 05/05/18   Shirley Friar, PA-C  sildenafil (REVATIO) 20 MG tablet Take 1 tablet (20 mg total) by mouth 3 (three) times daily. 05/19/18   Love, Ivan Anchors, PA-C  traMADol (ULTRAM) 50 MG tablet Take 1 tablet (50 mg total) by mouth every 6 (six) hours as needed. 06/18/18   Larey Dresser, MD  traZODone (DESYREL) 50 MG tablet Take 2 tablets (100 mg total) by mouth at bedtime. 06/18/18   Larey Dresser, MD  warfarin (COUMADIN) 5 MG tablet Take 1 tablet (5 mg) daily except 1/2 tablet (2.5 mg) on Tuesday    [provider]    Past Medical History: Past Medical History:  Diagnosis Date  . AICD (automatic cardioverter/defibrillator) present   . Asthma   . AVM (arteriovenous malformation) of colon 07/07/2017  . CHF (congestive heart failure) ([place])   . Diabetes ([place])   . ESRF (end stage renal failure) ([place]) 07/07/2017   pt receiving hemodialysis Monday- Wednesday-friday, sometimes Saturday  . Kidney disease   . LVAD (left ventricular assist device) present ([place]) 12/2016  . Permanent  atrial fibrillation ([place]) 07/07/2017  . Presence of permanent cardiac pacemaker    AICD  . Sleep apnea     Past Surgical History: Past Surgical History:  Procedure Laterality Date  . AORTIC VALVE REPLACEMENT  2015   [place]  . APPLICATION OF A-CELL OF EXTREMITY Left 11/20/2017   Procedure: APPLICATION OF A-CELL;  Surgeon: Wallace Going, DO;  Location: [facility];  Service: Plastics;  Laterality: Left;  . AV FISTULA PLACEMENT Left 06/30/2017   Procedure: CREATION of LEFT ARM Brachiocephalic Fistula;  Surgeon: Conrad [place], MD;  Location: [place];  Service: Vascular;  Laterality: Left;  . CARDIAC CATHETERIZATION N/A 12/02/2016   Procedure: Right Heart Cath;  Surgeon: Larey Dresser, MD;  Location: [place] CV [facility];  Service: Cardiovascular;  Laterality: N/A;  . COLONOSCOPY N/A 02/14/2017   Procedure: COLONOSCOPY;  Surgeon: Milus Banister, MD;  Location: [place];  Service: Endoscopy;  Laterality: N/A;  . COLONOSCOPY WITH PROPOFOL N/A 02/27/2018   Procedure: COLONOSCOPY WITH PROPOFOL;  Surgeon: Jerene Bears, MD;  Location: [place];  Service: Gastroenterology;  Laterality: N/A;  . CORONARY ANGIOPLASTY WITH STENT PLACEMENT  2013   in [place]  . CORONARY ARTERY BYPASS GRAFT  2010   in [place]  . DIALYSIS/PERMA CATHETER INSERTION  06/30/2017   Procedure: INSERTION Dialysis Catheter;  Surgeon: Conrad Manata, MD;  Location: [facility] OR;  Service: Vascular;;  . ESOPHAGOGASTRODUODENOSCOPY (EGD) WITH PROPOFOL N/A 02/27/2018   Procedure: ESOPHAGOGASTRODUODENOSCOPY (EGD) WITH PROPOFOL;  Surgeon: Jerene Bears, MD;  Location: [facility] ENDOSCOPY;  Service: Gastroenterology;  Laterality: N/A;  . GIVENS CAPSULE STUDY N/A 02/27/2018   Procedure: GIVENS CAPSULE STUDY;  Surgeon: Jerene Bears, MD;  Location: [place];  Service: Gastroenterology;  Laterality: N/A;  . GIVENS CAPSULE STUDY N/A 03/25/2018   Procedure: GIVENS CAPSULE STUDY;  Surgeon: Jackquline Denmark, MD;  Location: [facility] ENDOSCOPY;  Service: Endoscopy;   Laterality: N/A;  . IABP INSERTION N/A 12/11/2016   Procedure: IABP Insertion;  Surgeon: Larey Dresser, MD;  Location: [facility] CV LAB;  Service: Cardiovascular;  Laterality: N/A;  . INSERTION OF IMPLANTABLE LEFT VENTRICULAR ASSIST DEVICE N/A 12/13/2016   Procedure: INSERTION OF IMPLANTABLE LEFT VENTRICULAR ASSIST  DEVICE;  Surgeon: Ivin Poot, MD;  Location: [place];  Service: Open Heart Surgery;  Laterality: N/A;  HEARTMATE II  NITRIC OXIDE  . IR FLUORO GUIDE CV LINE LEFT  06/17/2017  . IR US GUIDE VASC ACCESS LEFT  06/17/2017  . MASS EXCISION Left 11/20/2017   Procedure: EXCISION OF LEFT NECK AND RIGHT LEG SKIN CANCER WITH A CELL PLACEMENT;  Surgeon: Wallace Going, DO;  Location: [place];  Service: Plastics;  Laterality: Left;  Marland Kitchen MASS EXCISION Left 11/26/2017   Procedure: EXPLORATION AND EXCISION OF LEFT NECK;  Surgeon: Wallace Going, DO;  Location: [place];  Service: Plastics;  Laterality: Left;  . RIGHT HEART CATH N/A 12/11/2016   Procedure: Right Heart Cath;  Surgeon: Larey Dresser, MD;  Location: [facility];  Service: Cardiovascular;  Laterality: N/A;  . RIGHT HEART CATH N/A 03/27/2017   Procedure: Right Heart Cath;  Surgeon: Larey Dresser, MD;  Location: [place] [facility];  Service: Cardiovascular;  Laterality: N/A;  . RIGHT HEART CATH N/A 06/13/2017   Procedure: RIGHT HEART CATH;  Surgeon: Jolaine Artist, MD;  Location: [place] [facility];  Service: Cardiovascular;  Laterality: N/A;  . TEE WITHOUT CARDIOVERSION N/A 12/13/2016   Procedure: TRANSESOPHAGEAL ECHOCARDIOGRAM (TEE);  Surgeon: Ivin Poot, MD;  Location: [place];  Service: Open Heart Surgery;  Laterality: N/A;  . TRICUSPID VALVE REPLACEMENT N/A 12/13/2016   Procedure: TRICUSPID VALVE REPAIR WITH EDWARDS MC 3 TRICUSPID ANNULOPLASTY RING MODEL 4900 SIZE T 28;  Surgeon: Ivin Poot, MD;  Location: [place];  Service: Open Heart Surgery;  Laterality: N/A;    Family History: Family History  Problem  Relation Age of Onset  . Heart failure Father   . Heart attack Father   . Healthy Mother   . Diabetes Paternal Grandfather     Social History: Social History   Socioeconomic History  . Marital status: Widowed    Spouse name: Not on file  . Number of children: 0  . Years of education: 42  . Highest education level: Not on file  Occupational History  . Not on file  Social Needs  . Financial resource strain: Not on file  . Food insecurity:    Worry: Not on file    Inability: Not on file  . Transportation needs:    Medical: Not on file    Non-medical: Not on file  Tobacco Use  . Smoking status: Never Smoker  . Smokeless tobacco: Never Used  Substance and Sexual Activity  . Alcohol use: No  . Drug use: No  . Sexual activity: Not Currently  Lifestyle  . Physical activity:    Days per week: Not on file    Minutes per session: Not on file  . Stress: Not on file  Relationships  . Social connections:    Talks on phone: Not on file    Gets together: Not on file    Attends religious service: Not on file    Active member of club or organization: Not on file    Attends meetings of clubs or organizations: Not on file    Relationship status: Not on file  Other Topics Concern  . Not on file  Social History Narrative   Patient is a widow. Moved to [place] from [place], [place]. Currently lives with his sister Abdalla Naramore.    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     Allergies:  No Known Allergies  Objective:    Vital Signs:   BP: ()/()  Arterial Line BP: ()/()    Mean arterial Pressure 80  Physical Exam    General:  Chronically ill appearing. Somewhat uncomfortable.  HEENT: Normal Neck: supple. JVP ~10 cm. Carotids 2+ bilat; no bruits. No lymphadenopathy or thyromegaly appreciated. Cor: Mechanical heart sounds with LVAD hum present. Lungs: Mildly diminished basilar sounds.  Abdomen: Soft, but mildly tender diffusely. No rigidity or rebound  tenderness. nondistended. No hepatosplenomegaly. No bruits or masses. Mildly increased bowel sounds.  Driveline: C/D/I; securement device intact and driveline incorporated Extremities: no cyanosis, clubbing, or rash. Trace to 1+ edema. Neuro: alert & orientedx3, cranial nerves grossly intact. moves all 4 extremities w/o difficulty. Affect pleasant  Telemetry   AF (chronic) 80s, personally reviewed.  EKG   Pending.   Labs    Basic Metabolic Panel: Recent Labs  Lab 06/18/18 1054 06/22/18 0849  NA 136 135  K 3.8 3.9  CL 98 97*  CO2 28 26  GLUCOSE 178* 326*  BUN 26* 30*  CREATININE 2.46* 2.83*  CALCIUM 9.0 8.6*    Liver Function Tests: Recent Labs  Lab 06/22/18 0849  AST 33  ALT 30  ALKPHOS 87  BILITOT 1.0  PROT 6.4*  ALBUMIN 3.1*   No results for input(s): LIPASE, AMYLASE in the last 168 hours. No results for input(s): AMMONIA in the last 168 hours.  CBC: Recent Labs  Lab 06/18/18 1054 06/22/18 0849  WBC 13.8* 10.2  NEUTROABS  --  8.6*  HGB 10.1* 9.2*  HCT 33.9* 30.8*  MCV 95.0 94.5  PLT 169 159    Cardiac Enzymes: No results for input(s): CKTOTAL, CKMB, CKMBINDEX, TROPONINI in the last 168 hours.  BNP: BNP (last 3 results) Recent Labs    05/25/18 1905  BNP 1,084.4*    ProBNP (last 3 results) No results for input(s): PROBNP in the last 8760 hours.   CBG: No results for input(s): GLUCAP in the last 168 hours.  Coagulation Studies: Recent Labs    06/22/18 0849  LABPROT 16.8*  INR 1.37    Other results:  Imaging    Dg Chest Portable 1 View  Result Date: 06/22/2018 CLINICAL DATA:  Acute onset shortness of breath with center chest tightness. Chest congestion. EXAM: PORTABLE CHEST 1 VIEW COMPARISON:  06/08/2018 and CT chest 11/25/2017. FINDINGS: Trachea is midline. Heart size stable. Left ventricular assist device is in place. Thoracic aorta is calcified. Right-sided ICD lead projects over the right ventricle. Lungs are somewhat low in  volume with an elevated left hemidiaphragm. No airspace consolidation or pleural fluid. IMPRESSION: 1. No acute findings. 2.  Aortic atherosclerosis (ICD10-170.0). Electronically Signed   By: Lorin Picket M.D.   On: 06/22/2018 08:43   Dg Abd Portable 1 View  Result Date: 06/22/2018 CLINICAL DATA:  Abdominal pain.  Constipation. EXAM: PORTABLE ABDOMEN - 1 VIEW COMPARISON:  Plain films of the abdomen 03/29/2018. FINDINGS: The bowel gas pattern is nonobstructive. There is a large volume of stool throughout the colon. No abnormal abdominal calcification is seen. Marked convex left thoracolumbar scoliosis is noted. Left ventricular assist device and AICD lead are noted. IMPRESSION: No acute finding. Moderately large colonic stool burden. Marked convex left scoliosis. Electronically Signed   By: Inge Rise M.D.   On: 06/22/2018 09:01     Patient Profile:   Trevor [name] is a 72 y.o. 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   Assessment/Plan:    1. Abdominal Pain - No obstructive pattern on ABD films. Large stool burden.  - ? Combination of volume, constipation, and doxycycline.  - WBC not elevated.  - Initially resolved with enema/Large BM, but worsened throughout the day. With N/V and loss of appetite will admit for further eval and treatment. May need CT ABD.  2. Chronic systolic CHF: Ischemic cardiomyopathy - EF 20-25% with RV dysfunction on 2/18 echo pre-LVAD.  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  Medtronic ICD.  He has had problems with RV failure post-op.  He developed worsening renal dysfunction and finally had to start HD in 8/18. There has been some difficulty getting adequate fluid off him,  currently dialyzing 4 days/week.  He was recently admitted with volume overload and had HD several days in a row.  Per inpatient renal consult, planned to lower dry weight but this does not seem to have been carried over to his outpatient HD. NYHA III symptoms, stable. He does appear volume overloaded still.  - We will contact his dialysis center about lowering his dry weight.  Suspect he lost muscle mass during recent prolonged hospitalization and will need a lower dry weight.  - Continue warfarin with INR goal 1.8 - 2.3.  No ASA with GI bleeding.  3. CAD: s/p CABG.  - No s/s of ischemia.    - He is on atorvastatin.  4. ESRD: - Ongoing HD 4 days/week, see above.  - Nephro consulted. Will see tomorrow am.  5. Atrial fibrillation: Chronic.   - On warfarin. 6. RV failure:  - Continue Revatio at 40 mg tid.   7. HTN:  - MAP stable.  8. 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  - Hgb 10.1 06/18/18. No bleeding. Pending this am.  9. GI bleeding: GI AVMs. Now s/p laparoscopic surgical enteroscopy with 4 clips on 04/10/18 at [facility], c/b ischemic bowel/SBO. No evidence currently for overt bleeding. Hgb 10.1 today. No BRBPR/melena.  - Tolerating warfarin. Goal INR 1.8-2.3, adjust to keep in this range.  - Getting Aranesp per nephrology.  - Continue octreotide  10. Squamous cell skin cancer:  Neck excision site without clear margins.  He saw Dr Lisbeth Renshaw with radiation oncology, radiation was recommended.  He wants to hold off for the time being, does not think he can handle radiation as HD wears him out 4 days/week. He now has a nodule concerning for skin cancer on his forehead, will refer him back to [name].   45. H/o TAVR:  - Stable on last echo.  12. Septic Left Knee: s/p I&D and surgical wash out at [facility] - Getting PT.  - Needs ortho followup.  13. ID:  - Started on doxycycline 100 mg bid x 7 days 06/18/18.  Abdominal pain initially  resolved with large BM, but worsened throughout the day at home. Will admit to [facility] for further evaluation and treatment. He will need HD arranged. Nephro has been consulted. Will see him tomorrow.   I reviewed the LVAD parameters from today, and compared the results to the patient's prior recorded data.  No programming changes were made.  The LVAD is functioning within specified parameters.  The patient  performs LVAD self-test daily.  LVAD interrogation was negative for any significant power changes, alarms or PI events/speed drops.  LVAD equipment check completed and is in good working order.  Back-up equipment present.   LVAD education done on emergency procedures and precautions and reviewed exit site care.  Length of Stay: [place], NP 06/22/2018, 4:44 PM  VAD Team Pager [phone] (7am - 7am) +++VAD ISSUES ONLY+++   Advanced Heart Failure Team Pager [phone] (M-F; [place])  Please contact [facility] for night-coverage after hours (4p -7a ) and weekends on [email or web address] for all non- LVAD Issues  Patient seen and examined with the above-signed Advanced Practice Provider and/or Housestaff. I personally reviewed laboratory data, imaging studies and relevant notes. I independently examined the patient and formulated the important aspects of the plan. I have edited the note to reflect any of my changes or salient points. I have personally discussed the plan with the patient and/or family.  72 y/o male with iCM s/p LVAD placement. Course complicated by severe GI bleeding and renal failure. Has been unable tolerated HD and has now had multiple ER visits and admissions due to HD intolerance. He has been turned down for PD due to multiple abdominal surgeries. He was seen earlier today in ER for constipation and had large BM but returns now for ongoing n/v/weakness. He is failing therapy. We will need to have a frank discussion with him this admit that if we cannot find a center to place PD  catheter in him we will strongly need to consider Hospice an abandoning further attempts at HD. VAD interrogated personally. Parameters stable.  Glori Bickers, MD  9:04 PM

## 2018-06-23 LAB — GLUCOSE, CAPILLARY
GLUCOSE-CAPILLARY: 196 mg/dL — AB (ref 70–99)
GLUCOSE-CAPILLARY: 176 mg/dL — AB (ref 70–99)
GLUCOSE-CAPILLARY: 144 mg/dL — AB (ref 70–99)
Glucose-Capillary: 201 mg/dL — ABNORMAL HIGH (ref 70–99)

## 2018-06-23 LAB — BASIC METABOLIC PANEL
Anion gap: 10 (ref 5–15)
BUN: 34 mg/dL — AB (ref 8–23)
CO2: 24 mmol/L (ref 22–32)
Calcium: 8.3 mg/dL — ABNORMAL LOW (ref 8.9–10.3)
Chloride: 97 mmol/L — ABNORMAL LOW (ref 98–111)
Creatinine, Ser: 3.91 mg/dL — ABNORMAL HIGH (ref 0.61–1.24)
GFR calc Af Amer: 16 mL/min — ABNORMAL LOW (ref >60)
GFR, EST NON AFRICAN AMERICAN: 14 mL/min — AB (ref >60)
Glucose, Bld: 200 mg/dL — ABNORMAL HIGH (ref 70–99)
POTASSIUM: 3.8 mmol/L (ref 3.5–5.1)
SODIUM: 131 mmol/L — AB (ref 135–145)

## 2018-06-23 LAB — CBC
HCT: 29.5 % — ABNORMAL LOW (ref 39.0–52.0)
Hemoglobin: 8.9 g/dL — ABNORMAL LOW (ref 13.0–17.0)
MCH: 28.2 pg (ref 26.0–34.0)
MCHC: 30.2 g/dL (ref 30.0–36.0)
MCV: 93.4 fL (ref 78.0–100.0)
PLATELETS: 156 10*3/uL (ref 150–400)
RBC: 3.16 MIL/uL — AB (ref 4.22–5.81)
RDW: 18.3 % — ABNORMAL HIGH (ref 11.5–15.5)
WBC: 11.4 10*3/uL — ABNORMAL HIGH (ref 4.0–10.5)

## 2018-06-23 LAB — LACTATE DEHYDROGENASE: LDH: 199 U/L — AB (ref 98–192)

## 2018-06-23 LAB — PROTIME-INR
INR: 1.39
PROTHROMBIN TIME: 17 s — AB (ref 11.4–15.2)

## 2018-06-23 LAB — HEPARIN LEVEL (UNFRACTIONATED): Heparin Unfractionated: 0.22 IU/mL — ABNORMAL LOW (ref 0.30–0.70)

## 2018-06-23 MED ORDER — CALCIUM CARBONATE ANTACID 500 MG PO CHEW
1.0000 | CHEWABLE_TABLET | Freq: Three times a day (TID) | ORAL | Status: DC | PRN
  Administered 2018-06-23 – 2018-06-24 (×3): 400 mg via ORAL
  Filled 2018-06-23 (×3): qty 2

## 2018-06-23 MED ORDER — WARFARIN - PHARMACIST DOSING INPATIENT
Freq: Every day | Status: DC
  Administered 2018-06-23 – 2018-06-26 (×2)
  Administered 2018-06-30: 1

## 2018-06-23 MED ORDER — CARVEDILOL 3.125 MG PO TABS
3.1250 mg | ORAL_TABLET | Freq: Two times a day (BID) | ORAL | Status: DC
  Administered 2018-06-23 – 2018-06-28 (×10): 3.125 mg via ORAL
  Filled 2018-06-23 (×10): qty 1

## 2018-06-23 MED ORDER — WARFARIN SODIUM 5 MG PO TABS
5.0000 mg | ORAL_TABLET | Freq: Once | ORAL | Status: AC
  Administered 2018-06-23: 5 mg via ORAL
  Filled 2018-06-23: qty 1

## 2018-06-23 MED ORDER — CHLORHEXIDINE GLUCONATE CLOTH 2 % EX PADS
6.0000 | MEDICATED_PAD | Freq: Every day | CUTANEOUS | Status: DC
  Administered 2018-06-25: 6 via TOPICAL

## 2018-06-23 NOTE — Progress Notes (Signed)
LVAD Coordinator Rounding Note:  HM II LVAD implanted on 12/13/16 by Dr. Darcey Nora under Destination Therapy criteria due to age excluding heart transplantation.  Pt admitted 06/22/18 for nausea and vomiting.   Pt lying in bed asleep this am.  Vital signs: Temp:  98.0 HR: 100 A-fib Automatic BP:  109/77 (89) Doppler:  89 O2 Sat: 99 % RA Wt: 78.2 kgs  LVAD interrogation reveals:  Speed:  9200 Flow:  4.5 Power: 5.2w PI: 6.3 Alarms: none Events: none Fixed speed: 9200 Low speed limit: 8600   Drive Line:  Right abd dressing CDI. Next dressing change due 06/29/18. Bedside nurse may change dressing.   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 Margins not clear at neck. - 02/23/18> hospitalization for GI bleed. Colon/EGD 02/27/18 with normal EGD. Colonoscopy identified active bleeding in ileum; source not found. Capsule Endoscopy 02/27/18: Showed active bleeding in the ileum. Received 5 units blood with histamine reaction; will give benadryl as OP. - 03/10/18>transferrred to [facility] for double retro balloon scop and sent to IR emolization x 3 with no evidence of bleeding. Received 12 units PCs, remained off anticoagulants. Transferred back to [facility] 03/20/18 Seen by IR 5/20. CTA obtained, but no active bleeding found. Started on octreotide. 03/25/18: Repeat capsule endoscopy completed. Bleeding appears to come from the mid-ileum. 03/26/18: General surgery reconsulted. Recommended  transfer back to [facility] for surgery with intraoperative endoscopy. [name] currently discussing options. Ortho consulted 5/24 for left knee pain. S/p knee aspiration and steroid injection.  Dose of premarin given 5/24.  On 5/25 developed abdominal bloating and distension with n/v. KUB suggestive of ileus versus early SBO. NGT placed for decompression.  Had large bloody BM after this and felt much better, diet now advanced to regular.  - 04/01/18 transferred to [facility] for  laparoscopic surgical enteroscopy for recurrent GI bleed   Plan/Recommendations: 1.  Weekly dressing changes per bedside nurse. Next dressing change due 06/29/18. 2.  Call VAD pager if any questions re: VAD equipment or drive line site issues.   [name] RN, VAD Coordinator 24/7 VAD pager: [phone]

## 2018-06-23 NOTE — Progress Notes (Signed)
ANTICOAGULATION CONSULT NOTE   Pharmacy Consult for Heparin  Indication: LVAD  No Known Allergies  Patient Measurements: Height: 5\' 5"  (165.1 cm) Weight: 172 lb 6.4 oz (78.2 kg) IBW/kg (Calculated) : 61.5 Heparin Dosing Weight: 75 kg  Vital Signs: Temp: 97.7 F (36.5 C) (08/20 0500) Temp Source: Oral (08/20 0500) BP: 101/75 (08/20 0500) Pulse Rate: 94 (08/20 0500)  Labs: Recent Labs    06/22/18 0849 06/23/18 0526  HGB 9.2* 8.9*  HCT 30.8* 29.5*  PLT 159 156  APTT 31  --   LABPROT 16.8* 17.0*  INR 1.37 1.39  HEPARINUNFRC  --  <0.10*  CREATININE 2.83*  --     Estimated Creatinine Clearance: 22.8 mL/min (A) (by C-G formula based on SCr of 2.83 mg/dL (H)).   Medical History: Past Medical History:  Diagnosis Date  . AICD (automatic cardioverter/defibrillator) present   . Asthma   . AVM (arteriovenous malformation) of colon 07/07/2017  . CHF (congestive heart failure) ([place])   . Diabetes ([place])   . ESRF (end stage renal failure) ([place]) 07/07/2017   pt receiving hemodialysis Monday- Wednesday-friday, sometimes Saturday  . Kidney disease   . LVAD (left ventricular assist device) present ([place]) 12/2016  . Permanent atrial fibrillation ([place]) 07/07/2017  . Presence of permanent cardiac pacemaker    AICD  . Sleep apnea      Assessment: 42 yoM with LVAD and significant hx of GIB presents with abdominal pain and subtherapeutic INR. Pharmacy consulted to resume warfarin and start IV heparin bridge while INR low. INR 1.37 in clinic, PTA dose adjusted to 7.5mg  x1 then 5mg  daily. Pt reports taking warfarin today prior to being admitted.  8/20 AM update: heparin level undetectable this AM, Hgb fairly stable, INR down to 1.39  Goal of Therapy:  Heparin level 0.3-0.5 units/ml INR 1.8-2.4 Monitor platelets by anticoagulation protocol: Yes   Plan:  Inc heparin to 1100 units/hr 1500 HL  Narda Bonds, PharmD, BCPS Clinical Pharmacist Phone: [phone]

## 2018-06-23 NOTE — Progress Notes (Signed)
[place] for Heparin and warfarin  Indication: LVAD  No Known Allergies  Patient Measurements: Height: 5\' 5"  (165.1 cm) Weight: 172 lb 6.4 oz (78.2 kg) IBW/kg (Calculated) : 61.5 Heparin Dosing Weight: 75 kg  Vital Signs: Temp: 98 F (36.7 C) (08/20 1533) Temp Source: Oral (08/20 1533) BP: 93/68 (08/20 1533) Pulse Rate: 96 (08/20 1533)  Labs: Recent Labs    06/22/18 0849 06/23/18 0526 06/23/18 1419  HGB 9.2* 8.9*  --   HCT 30.8* 29.5*  --   PLT 159 156  --   APTT 31  --   --   LABPROT 16.8* 17.0*  --   INR 1.37 1.39  --   HEPARINUNFRC  --  <0.10* 0.22*  CREATININE 2.83* 3.91*  --     Estimated Creatinine Clearance: 16.5 mL/min (A) (by C-G formula based on SCr of 3.91 mg/dL (H)).   Medical History: Past Medical History:  Diagnosis Date  . AICD (automatic cardioverter/defibrillator) present   . Asthma   . AVM (arteriovenous malformation) of colon 07/07/2017  . CHF (congestive heart failure) ([place])   . Diabetes ([place])   . ESRF (end stage renal failure) ([place]) 07/07/2017   pt receiving hemodialysis Monday- Wednesday-friday, sometimes Saturday  . Kidney disease   . LVAD (left ventricular assist device) present ([place]) 12/2016  . Permanent atrial fibrillation ([place]) 07/07/2017  . Presence of permanent cardiac pacemaker    AICD  . Sleep apnea     Assessment: 27 yoM with LVAD and significant hx of GIB presents with abdominal pain and subtherapeutic INR. Pharmacy consulted to resume warfarin and start IV heparin bridge while INR low. INR 1.37 in clinic, PTA dose adjusted to 7.5mg  x1 then 5mg  daily. Pt reports taking warfarin 8/19 prior to being admitted.  Heparin level slightly subtherapeutic at 0.22 at 1,100 units/hr INR subtherapeutic at 1.39  CBC stable, no bleeding noted per nursing.  Goal of Therapy:  Heparin level 0.3-0.5 units/ml INR 1.8-2.4 Monitor platelets by anticoagulation protocol: Yes   Plan:  Warfarin 5 mg  x1 Increase heparin to 1,200 units/hr Check heparin level in 6 hours Daily INR Monitor H/H, platelets, and signs/symptoms of bleeding.  Vertis Kelch, PharmD PGY1 Pharmacy Resident Phone [phone] 06/23/2018       4:33 PM

## 2018-06-23 NOTE — Progress Notes (Signed)
[facility]  Patient Status: Active (receiving services up to time of hospitalization)  [facility] is providing the following services: RN and PT  If patient discharges after hours, please call [phone].   Trevor [name] 06/23/2018, 9:58 AM

## 2018-06-23 NOTE — Progress Notes (Signed)
[facility] Progress Note  Subjective: asked to see pt for dialysis, admitted w/ constipation and N/V for 2 days. Constipation relieved yest w/ Fleet's enema x 2.  Still nauseous , can't eat much, no abd pain no fever, no hematemesis.  Admitted yesterday.    Vitals:   06/23/18 0723 06/23/18 0745 06/23/18 1139 06/23/18 1533  BP:  109/77 95/79 93/68   Pulse: (!) 103 (!) 112 (!) 103 96  Resp: 17 (!) 6 17 16   Temp:  98.6 F (37 C) 98 F (36.7 C) 98 F (36.7 C)  TempSrc:  Oral Oral Oral  SpO2: 94% 99% 98% 100%  Weight:      Height:        Inpatient medications: . atorvastatin  40 mg Oral q1800  . budesonide (PULMICORT) nebulizer solution  0.25 mg Nebulization BID  . busPIRone  5 mg Oral BID  . [START ON 06/24/2018] calcitRIOL  0.5 mcg Oral Q M,W,F-HD  . carvedilol  3.125 mg Oral BID WC  . docusate sodium  200 mg Oral Daily  . doxycycline  100 mg Oral BID  . dronabinol  2.5 mg Oral BID AC  . insulin aspart  0-5 Units Subcutaneous QHS  . insulin aspart  0-9 Units Subcutaneous TID WC  . insulin glargine  4 Units Subcutaneous QHS  . insulin glargine  6 Units Subcutaneous Daily  . levothyroxine  25 mcg Oral QAC breakfast  . megestrol  40 mg Oral Daily  . multivitamin  1 tablet Oral QHS  . pantoprazole  40 mg Oral Daily  . senna-docusate  1 tablet Oral BID  . sildenafil  20 mg Oral TID  . traZODone  100 mg Oral QHS  . warfarin  5 mg Oral ONCE-1800  . Warfarin - Pharmacist Dosing Inpatient   Does not apply q1800   . heparin 1,100 Units/hr (06/23/18 1151)   acetaminophen, albuterol, benzonatate, calcium carbonate, LORazepam, ondansetron (ZOFRAN) IV, polyethylene glycol, traMADol  Iron/TIBC/Ferritin/ %Sat    Component Value Date/Time   IRON 125 06/14/2017 1605   TIBC 221 (L) 06/14/2017 1605   FERRITIN 622 (H) 06/14/2017 1605   IRONPCTSAT 57 (H) 06/14/2017 1605    Exam: Genalert, not in distress, calm, tired No jvd or bruits Chest clear on L, R base  decreased Cor LVAD hum Abd soft ntnd no mass or ascites +bsy Ext trace edema R pretib and 1+ L pretib Neuro is alert, Ox 3 , nf   Home meds: - atorvastatin 40 hs// levothyroxine 25 ug / megestrol 40 / pantoprazole 40 qd  - trazodone 100 mg hs/ tramadol 50 qid prn/ lorazepam 0.5 tid prn/ buspirone 5 bid - sildenafil 20 mg tid - insulin glargine 4u am and 6u pm  - warfarin 5 mg qd  - albuterol prn / flovent prn/  fluticasone 2 puff bid  - other prn's/ vitamins  Dialysis:MWFSat [facility] 4.5h 73.5kg LUA AVF Hep none - mircera 225 last on 8/5  - calc 0.5ug tiw  CXR 8/19 > clear, no acute   Impression: 1  Nausea/ vomiting - no abd pain, unsure cause.  Per primary team.  2 ESRD - MWFSat dialysis.  No need for urgent dialysis tonight, plan HD in am tomorrow on schedule.   3 ICM/ sp LVAD 4 CAD hx CABG 5 AS sp TAVRS 6 DM2 7  Hx recurrent GIB - stable Hb 8.9,  no signs of bleeding 8  Nutrition - regular diet, doesn't need renal diet, pt request 9  Volume - not sig vol overloaded on exam, CXR clear, hasn't been eating a lot. UF 2 L as tol tomorrow on HD.   Plan - as above   Kelly Splinter MD [facility] pager [phone]   06/23/2018, 4:52 PM   Recent Labs  Lab 06/22/18 0849 06/23/18 0526  NA 135 131*  K 3.9 3.8  CL 97* 97*  CO2 26 24  GLUCOSE 326* 200*  BUN 30* 34*  CREATININE 2.83* 3.91*  CALCIUM 8.6* 8.3*  ALBUMIN 3.1*  --   INR 1.37 1.39   Recent Labs  Lab 06/22/18 0849  AST 33  ALT 30  ALKPHOS 87  BILITOT 1.0  PROT 6.4*   Recent Labs  Lab 06/22/18 0849 06/23/18 0526  WBC 10.2 11.4*  NEUTROABS 8.6*  --   HGB 9.2* 8.9*  HCT 30.8* 29.5*  MCV 94.5 93.4  PLT 159 156

## 2018-06-23 NOTE — Progress Notes (Addendum)
Advanced Heart Failure VAD Team Note  PCP-Cardiologist: No primary care provider on file.   Subjective:    Abdominal pain somewhat better this am. Had additional bowel movement. Nausea better controlled. Feels very weak.   ABD Portable 1V 06/22/18 - No acute finding. Moderately large colonic stool burden.   CXR 06/22/18 - No acute findings.   LVAD Interrogation HM 2: Speed: 9200 Flow: 5.1 PI: 6.1 Power: 6. No PI events   Objective:    Vital Signs:   Temp:  [97.7 F (36.5 C)-98.6 F (37 C)] 98.6 F (37 C) (08/20 0745) Pulse Rate:  [94-112] 112 (08/20 0745) Resp:  [6-25] 6 (08/20 0745) BP: (101-111)/(70-77) 109/77 (08/20 0745) SpO2:  [94 %-99 %] 99 % (08/20 0745) Weight:  [78.2 kg] 78.2 kg (08/19 2050) Last BM Date: 06/22/18 Mean arterial Pressure 80s  Intake/Output:   Intake/Output Summary (Last 24 hours) at 06/23/2018 0756 Last data filed at 06/23/2018 0700 Gross per 24 hour  Intake 77.64 ml  Output 100 ml  Net -22.36 ml     Physical Exam    General:  Well appearing. No resp difficulty HEENT: normal anicteric Neck: supple. JVP 9-10 . Carotids 2+ bilat; no bruits. No lymphadenopathy or thyromegaly appreciated. Cor: Mechanical heart sounds with LVAD hum present. Lungs: clear Abdomen: soft, nontender, nondistended. No hepatosplenomegaly. No bruits or masses. Good bowel sounds. Driveline: C/D/I; securement device intact and driveline incorporated Extremities: no cyanosis, clubbing, rash, tr- 1+ edema Neuro: alert & oriented x 3, cranial nerves grossly intact. moves all 4 extremities w/o difficulty. Affect pleasant    Telemetry   AFib 100-110s, personally reviewed.   EKG    Afib 95 bpm, personally reviewed (on admit)  Labs   Basic Metabolic Panel: Recent Labs  Lab 06/18/18 1054 06/22/18 0849 06/23/18 0526  NA 136 135 131*  K 3.8 3.9 3.8  CL 98 97* 97*  CO2 28 26 24   GLUCOSE 178* 326* 200*  BUN 26* 30* 34*  CREATININE 2.46* 2.83* 3.91*  CALCIUM  9.0 8.6* 8.3*    Liver Function Tests: Recent Labs  Lab 06/22/18 0849  AST 33  ALT 30  ALKPHOS 87  BILITOT 1.0  PROT 6.4*  ALBUMIN 3.1*   No results for input(s): LIPASE, AMYLASE in the last 168 hours. No results for input(s): AMMONIA in the last 168 hours.  CBC: Recent Labs  Lab 06/18/18 1054 06/22/18 0849 06/23/18 0526  WBC 13.8* 10.2 11.4*  NEUTROABS  --  8.6*  --   HGB 10.1* 9.2* 8.9*  HCT 33.9* 30.8* 29.5*  MCV 95.0 94.5 93.4  PLT 169 159 156    INR: Recent Labs  Lab 06/18/18 1054 06/22/18 0849 06/23/18 0526  INR 1.32 1.37 1.39    Imaging   Dg Chest Portable 1 View  Result Date: 06/22/2018 CLINICAL DATA:  Acute onset shortness of breath with center chest tightness. Chest congestion. EXAM: PORTABLE CHEST 1 VIEW COMPARISON:  06/08/2018 and CT chest 11/25/2017. FINDINGS: Trachea is midline. Heart size stable. Left ventricular assist device is in place. Thoracic aorta is calcified. Right-sided ICD lead projects over the right ventricle. Lungs are somewhat low in volume with an elevated left hemidiaphragm. No airspace consolidation or pleural fluid. IMPRESSION: 1. No acute findings. 2.  Aortic atherosclerosis (ICD10-170.0). Electronically Signed   By: Lorin Picket M.D.   On: 06/22/2018 08:43   Dg Abd Portable 1 View  Result Date: 06/22/2018 CLINICAL DATA:  Abdominal pain.  Constipation. EXAM: PORTABLE ABDOMEN - 1 VIEW  COMPARISON:  Plain films of the abdomen 03/29/2018. FINDINGS: The bowel gas pattern is nonobstructive. There is a large volume of stool throughout the colon. No abnormal abdominal calcification is seen. Marked convex left thoracolumbar scoliosis is noted. Left ventricular assist device and AICD lead are noted. IMPRESSION: No acute finding. Moderately large colonic stool burden. Marked convex left scoliosis. Electronically Signed   By: Inge Rise M.D.   On: 06/22/2018 09:01      Medications:     Scheduled Medications: . atorvastatin  40  mg Oral q1800  . budesonide (PULMICORT) nebulizer solution  0.25 mg Nebulization BID  . busPIRone  5 mg Oral BID  . [START ON 06/24/2018] calcitRIOL  0.5 mcg Oral Q M,W,F-HD  . docusate sodium  200 mg Oral Daily  . doxycycline  100 mg Oral BID  . dronabinol  2.5 mg Oral BID AC  . insulin aspart  0-5 Units Subcutaneous QHS  . insulin aspart  0-9 Units Subcutaneous TID WC  . insulin glargine  4 Units Subcutaneous QHS  . insulin glargine  6 Units Subcutaneous Daily  . levothyroxine  25 mcg Oral QAC breakfast  . megestrol  40 mg Oral Daily  . multivitamin  1 tablet Oral QHS  . pantoprazole  40 mg Oral Daily  . senna-docusate  1 tablet Oral BID  . sildenafil  20 mg Oral TID  . traZODone  100 mg Oral QHS     Infusions: . heparin 1,100 Units/hr (06/23/18 0645)     PRN Medications:  acetaminophen, albuterol, benzonatate, LORazepam, ondansetron (ZOFRAN) IV, polyethylene glycol, traMADol   Patient Profile   Trevor [name] is a 72 y.o. male  with a history of chronic systolic CHF, s/p HM2 LVAD 12/2016, CAD s/p CABG x 4 2010, OSA, AS with TAVR 2015, ESRD, DM2, paroxysmal atrial fibrillation, Chronic anemia, Multiple GI bleeds, asthma, Squamous cell skin cancer of the neck, septic left knee s/p ID & surgical wash out at [facility], and ischemic cardiomyopathy with Medtronic ICD.   Admitted 06/22/18 with abdominal pain, nausea, and vomiting.   Assessment/Plan:    1. Abdominal Pain - No obstructive pattern on ABD films. Large stool burden.  - ? Combination of volume, constipation, and doxycycline.  - WBC 11.4 this am elevated.  - Initially resolved with enema/Large BM, but worsened throughout the day.  - Improved this am. With N/V and loss of appetite will admit for further eval and treatment. May need CT ABD.  2. Chronic systolic CHF: Ischemic cardiomyopathy - EF 20-25% with RV dysfunction on 2/18 echo pre-LVAD. 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 Medtronic ICD. He has had problems with RV failure  post-op. He developed worsening renal dysfunction and finally had to start HD in 8/18. There has been some difficulty getting adequate fluid off him, currently dialyzing 4 days/week. He was recently admitted with volume overload and had HD several days in a row. Per inpatient renal consult, planned to lower dry weight but this does not seem to have been carried over to his outpatient HD. NYHA III symptoms, stable. He does appear volume overloaded still.  - Volume status not markedly elevated today. Needs HD.  - Continue warfarin with INR goal 1.8 - 2.3. No ASA with GI bleeding.  3. CAD: s/p CABG.  - No s/s of ischemia.    - He is on atorvastatin.  4. ESRD: - Ongoing HD 4 days/week, see above. - Nephro consulted and will see today. Will need HD with missing yesterday.  -  Long discussion this am. Will contact outside centers to see if pt is candidate for PD anywhere. If not, will likely have to make transition to [place].  5. Atrial fibrillation: Chronic.  - On warfarin. Pharm D dosing.  - With elevated rate, will start low dose beta blocker.  6. RV failure:  - Continue Revatio at 40 mg TID.  7. HTN:  - MAP stable.  8. 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  - Hgb10.18/15/19. No bleeding. Pending this am.  9. GI bleeding: GI AVMs. Now s/p laparoscopic surgical enteroscopy with 4 clips on 6/7/19at [name], c/b ischemic bowel/SBO. No evidence currently for overt bleeding.Hgb 10.1 today. No BRBPR/melena. - Tolerating warfarin. Goal INR 1.8-2.3, adjust to keep in this range.  - Getting Aranesp per nephrology.  - Continue octreotide  10. Squamous cell skin cancer: Neck excision site without clear margins. He saw Dr Lisbeth Renshaw with radiation oncology, radiation was recommended. He wants to hold off for the time being, does not think he can handle radiation as HD wears him out 4 days/week. He now has a nodule concerning for skin  cancer on his forehead, will refer him back to [name].  15. H/o TAVR:  - Stable on last echo.  12. Septic Left Knee:s/p I&D and surgical wash out at [facility] -Getting PT.  - Needs ortho followup.  13. ID:  - Started on doxycycline 100 mg bid x 7 days 06/18/18.  Will contact outside centers to see if pt is candidate for PD anywhere. If not, will likely have to make transition to [place].   I reviewed the LVAD parameters from today, and compared the results to the patient's prior recorded data.  No programming changes were made.  The LVAD is functioning within specified parameters.  The patient performs LVAD self-test daily.  LVAD interrogation was negative for any significant power changes, alarms or PI events/speed drops.  LVAD equipment check completed and is in good working order.  Back-up equipment present.   LVAD education done on emergency procedures and precautions and reviewed exit site care.  Length of Stay: 1  Annamaria Helling 06/23/2018, 7:56 AM  VAD Team --- VAD ISSUES ONLY--- Pager [phone] (7am - 7am)  Advanced Heart Failure Team  Pager [phone] (M-F; 7a - 4p)  Please contact [facility] for night-coverage after hours (4p -7a ) and weekends on [email or web address]  Patient seen and examined with the above-signed Advanced Practice Provider and/or Housestaff. I personally reviewed laboratory data, imaging studies and relevant notes. I independently examined the patient and formulated the important aspects of the plan. I have edited the note to reflect any of my changes or salient points. I have personally discussed the plan with the patient and/or family.  He remains tenuous. He continues to struggle with HD and we had a long talk today discussing the fact that he does not want to continue HD and if that is the only option he would want Hospice. I have reached out personally to [facility] and [facility] to discuss possible placement of a PD catheter and both sites said they  might consider it. We have faxed records to [facility]. I have also d/w with Drs. Coladonato and [place] and they would be willing to attempt PD if we could get a catheter placed. We wil continue to f/u. If unable to get PD catheter then would engage Hospice and stop HD.  Glori Bickers, MD  7:43  PM

## 2018-06-24 LAB — GLUCOSE, CAPILLARY
GLUCOSE-CAPILLARY: 91 mg/dL (ref 70–99)
Glucose-Capillary: 121 mg/dL — ABNORMAL HIGH (ref 70–99)
Glucose-Capillary: 80 mg/dL (ref 70–99)
Glucose-Capillary: 167 mg/dL — ABNORMAL HIGH (ref 70–99)
Glucose-Capillary: 209 mg/dL — ABNORMAL HIGH (ref 70–99)

## 2018-06-24 LAB — RENAL FUNCTION PANEL
Albumin: 3.1 g/dL — ABNORMAL LOW (ref 3.5–5.0)
Anion gap: 14 (ref 5–15)
BUN: 41 mg/dL — ABNORMAL HIGH (ref 8–23)
CALCIUM: 8.5 mg/dL — AB (ref 8.9–10.3)
CO2: 23 mmol/L (ref 22–32)
Chloride: 96 mmol/L — ABNORMAL LOW (ref 98–111)
Creatinine, Ser: 4.43 mg/dL — ABNORMAL HIGH (ref 0.61–1.24)
GFR calc Af Amer: 14 mL/min — ABNORMAL LOW (ref >60)
GFR calc non Af Amer: 12 mL/min — ABNORMAL LOW (ref >60)
GLUCOSE: 118 mg/dL — AB (ref 70–99)
Phosphorus: 5.3 mg/dL — ABNORMAL HIGH (ref 2.5–4.6)
Potassium: 4.1 mmol/L (ref 3.5–5.1)
SODIUM: 133 mmol/L — AB (ref 135–145)

## 2018-06-24 LAB — MAGNESIUM: Magnesium: 1.8 mg/dL (ref 1.7–2.4)

## 2018-06-24 LAB — CBC
HCT: 30.7 % — ABNORMAL LOW (ref 39.0–52.0)
HEMOGLOBIN: 9.4 g/dL — AB (ref 13.0–17.0)
MCH: 28.4 pg (ref 26.0–34.0)
MCHC: 30.6 g/dL (ref 30.0–36.0)
MCV: 92.7 fL (ref 78.0–100.0)
Platelets: 170 10*3/uL (ref 150–400)
RBC: 3.31 MIL/uL — ABNORMAL LOW (ref 4.22–5.81)
RDW: 18.2 % — ABNORMAL HIGH (ref 11.5–15.5)
WBC: 9.9 10*3/uL (ref 4.0–10.5)

## 2018-06-24 LAB — HEPARIN LEVEL (UNFRACTIONATED)
Heparin Unfractionated: 0.22 IU/mL — ABNORMAL LOW (ref 0.30–0.70)
Heparin Unfractionated: 0.41 IU/mL (ref 0.30–0.70)

## 2018-06-24 LAB — LACTATE DEHYDROGENASE: LDH: 213 U/L — ABNORMAL HIGH (ref 98–192)

## 2018-06-24 LAB — PROTIME-INR
INR: 1.82
Prothrombin Time: 20.9 seconds — ABNORMAL HIGH (ref 11.4–15.2)

## 2018-06-24 MED ORDER — PENTAFLUOROPROP-TETRAFLUOROETH EX AERO
1.0000 "application " | INHALATION_SPRAY | CUTANEOUS | Status: DC | PRN
  Filled 2018-06-24: qty 103.5

## 2018-06-24 MED ORDER — SODIUM CHLORIDE 0.9 % IV SOLN: 100.0000 mL | INTRAVENOUS | Status: DC | PRN

## 2018-06-24 MED ORDER — LIDOCAINE-PRILOCAINE 2.5-2.5 % EX CREA: 1.0000 "application " | TOPICAL_CREAM | CUTANEOUS | Status: DC | PRN

## 2018-06-24 MED ORDER — WARFARIN SODIUM 5 MG PO TABS
5.0000 mg | ORAL_TABLET | Freq: Once | ORAL | Status: AC
  Administered 2018-06-24: 5 mg via ORAL
  Filled 2018-06-24 (×2): qty 1

## 2018-06-24 MED ORDER — CALCITRIOL 0.5 MCG PO CAPS
ORAL_CAPSULE | ORAL | Status: AC
  Filled 2018-06-24: qty 1

## 2018-06-24 MED ORDER — DOXYCYCLINE HYCLATE 100 MG PO TABS
100.0000 mg | ORAL_TABLET | Freq: Two times a day (BID) | ORAL | Status: AC
  Administered 2018-06-24 (×2): 100 mg via ORAL
  Filled 2018-06-24 (×3): qty 1

## 2018-06-24 NOTE — Progress Notes (Signed)
Received page from RN. Pt had 16 beats NSVT. He felt like he had heartburn during episode, now resolved. Potassium 4.1 this am. Will check mag and replace as needed.  Georgiana Shore, NP

## 2018-06-24 NOTE — Progress Notes (Signed)
Pt w/a 16 beat run of V-tach; pt states "I have heartburn again"; otherwise asymptomatic.  Describes as burning pressure.  HF team paged.

## 2018-06-24 NOTE — Progress Notes (Signed)
[facility] Progress Note  Subjective: c/o abd pain today, not so much nausea, no SOB or cough  Vitals:   06/24/18 0930 06/24/18 1000 06/24/18 1030 06/24/18 1100  BP: 140/81 (!) 131/92 127/74 119/71  Pulse: 95 (!) 107 (!) 111 (!) 111  Resp:      Temp:      TempSrc:      SpO2:      Weight:      Height:        Inpatient medications: . atorvastatin  40 mg Oral q1800  . budesonide (PULMICORT) nebulizer solution  0.25 mg Nebulization BID  . busPIRone  5 mg Oral BID  . calcitRIOL  0.5 mcg Oral Q M,W,F-HD  . carvedilol  3.125 mg Oral BID WC  . Chlorhexidine Gluconate Cloth  6 each Topical Q0600  . docusate sodium  200 mg Oral Daily  . doxycycline  100 mg Oral BID  . dronabinol  2.5 mg Oral BID AC  . insulin aspart  0-5 Units Subcutaneous QHS  . insulin aspart  0-9 Units Subcutaneous TID WC  . insulin glargine  4 Units Subcutaneous QHS  . insulin glargine  6 Units Subcutaneous Daily  . levothyroxine  25 mcg Oral QAC breakfast  . megestrol  40 mg Oral Daily  . multivitamin  1 tablet Oral QHS  . pantoprazole  40 mg Oral Daily  . senna-docusate  1 tablet Oral BID  . sildenafil  20 mg Oral TID  . traZODone  100 mg Oral QHS  . warfarin  5 mg Oral ONCE-1800  . Warfarin - Pharmacist Dosing Inpatient   Does not apply q1800   . sodium chloride     sodium chloride, acetaminophen, albuterol, benzonatate, calcium carbonate, lidocaine-prilocaine, LORazepam, ondansetron (ZOFRAN) IV, pentafluoroprop-tetrafluoroeth, polyethylene glycol, traMADol  Iron/TIBC/Ferritin/ %Sat    Component Value Date/Time   IRON 125 06/14/2017 1605   TIBC 221 (L) 06/14/2017 1605   FERRITIN 622 (H) 06/14/2017 1605   IRONPCTSAT 57 (H) 06/14/2017 1605    Exam: Genalert, not in distress, calm, tired No jvd or bruits Chest clear on L, R base decreased Cor LVAD hum Abd soft ntnd no mass or ascites +bsy Ext trace edema R pretib and 1+ L pretib Neuro is alert, Ox 3 , nf   Home meds: -  atorvastatin 40 hs// levothyroxine 25 ug / megestrol 40 / pantoprazole 40 qd  - trazodone 100 mg hs/ tramadol 50 qid prn/ lorazepam 0.5 tid prn/ buspirone 5 bid - sildenafil 20 mg tid - insulin glargine 4u am and 6u pm  - warfarin 5 mg qd  - albuterol prn / flovent prn/  fluticasone 2 puff bid  - other prn's/ vitamins  Dialysis:MWFSat [facility] 4.5h 73.5kg LUA AVF Hep none - mircera 225 last on 8/5  - calc 0.5ug tiw  CXR 8/19 > clear, no acute   Impression: 1  Nausea/ vomiting. abd pain - per primary team.  2 ESRD - MWFSat dialysis.  HD today on schedule. UF 2L as tol.   3 ICM/ sp LVAD 4 CAD hx CABG 5 AS sp TAVRS 6 DM2 7  Hx recurrent GIB - stable Hb 8.9,  no signs of bleeding 8  Nutrition - regular diet, doesn't need renal diet, pt request 9  Volume - mild excess/ LE edema, UF goal 2L today   Plan - as above   Kelly Splinter MD [employer] pager [phone]   06/24/2018, 11:17 AM   Recent Labs  Lab  06/22/18 0849 06/23/18 0526 06/24/18 0747 06/24/18 0748  NA 135 131*  --  133*  K 3.9 3.8  --  4.1  CL 97* 97*  --  96*  CO2 26 24  --  23  GLUCOSE 326* 200*  --  118*  BUN 30* 34*  --  41*  CREATININE 2.83* 3.91*  --  4.43*  CALCIUM 8.6* 8.3*  --  8.5*  PHOS  --   --   --  5.3*  ALBUMIN 3.1*  --   --  3.1*  INR 1.37 1.39 1.82  --    Recent Labs  Lab 06/22/18 0849  AST 33  ALT 30  ALKPHOS 87  BILITOT 1.0  PROT 6.4*   Recent Labs  Lab 06/22/18 0849 06/23/18 0526 06/24/18 0747  WBC 10.2 11.4* 9.9  NEUTROABS 8.6*  --   --   HGB 9.2* 8.9* 9.4*  HCT 30.8* 29.5* 30.7*  MCV 94.5 93.4 92.7  PLT 159 156 170

## 2018-06-24 NOTE — Progress Notes (Signed)
ANTICOAGULATION CONSULT NOTE   Pharmacy Consult for Heparin Indication: LVAD  No Known Allergies  Patient Measurements: Height: 5\' 5"  (165.1 cm) Weight: 172 lb 6.4 oz (78.2 kg) IBW/kg (Calculated) : 61.5 Heparin Dosing Weight: 75 kg  Vital Signs: Temp: 98.3 F (36.8 C) (08/20 2000) Temp Source: Oral (08/20 2000) BP: 105/93 (08/20 2000) Pulse Rate: 80 (08/20 2000)  Labs: Recent Labs    06/22/18 0849 06/23/18 0526 06/23/18 1419 06/23/18 2344  HGB 9.2* 8.9*  --   --   HCT 30.8* 29.5*  --   --   PLT 159 156  --   --   APTT 31  --   --   --   LABPROT 16.8* 17.0*  --   --   INR 1.37 1.39  --   --   HEPARINUNFRC  --  <0.10* 0.22* 0.22*  CREATININE 2.83* 3.91*  --   --     Estimated Creatinine Clearance: 16.5 mL/min (A) (by C-G formula based on SCr of 3.91 mg/dL (H)).   Assessment: 74 yoM with LVAD and significant hx of GIB presents with abdominal pain and subtherapeutic INR. Pharmacy consulted to resume warfarin and start IV heparin bridge while INR low. Heparin level remains low at 0.22 units/ml  Goal of Therapy:  Heparin level 0.3-0.5 units/ml INR 1.8-2.4 Monitor platelets by anticoagulation protocol: Yes   Plan:  Increase heparin to 1,300 units/hr Check heparin level in ~7 hours Monitor H/H, platelets, and signs/symptoms of bleeding.  Thanks for allowing pharmacy to be a part of this patient's care.  Excell Seltzer, PharmD Clinical Pharmacist 06/24/2018       1:03 AM

## 2018-06-24 NOTE — Progress Notes (Signed)
Oda Kilts, PA for HF team made aware of doppler pressure 106 after HD - no new orders; continue to observe

## 2018-06-24 NOTE — Progress Notes (Signed)
Heparin infusion stopped per pharmacy. Pt has no s/s of bleeding

## 2018-06-24 NOTE — Progress Notes (Signed)
Advanced Heart Failure VAD Team Note  PCP-Cardiologist: No primary care provider on file.   Subjective:    Feeling weak this am. No CP or SOB. Nausea improved.   ABD Portable 1V 06/22/18 - No acute finding. Moderately large colonic stool burden.   CXR 06/22/18 - No acute findings.   LVAD Interrogation HM 2: Speed: 9200 Flow: 4.6 PI: 6.0 Power: 5.0. VAD interrogated personally. Parameters stable.   Objective:    Vital Signs:   Temp:  [98 F (36.7 C)-98.9 F (37.2 C)] 98.9 F (37.2 C) (08/21 0400) Pulse Rate:  [80-112] 80 (08/20 2000) Resp:  [6-20] 14 (08/21 0604) BP: (93-109)/(68-93) 93/75 (08/21 0400) SpO2:  [98 %-100 %] 100 % (08/20 1533) Weight:  [74.8 kg] 74.8 kg (08/21 0604) Last BM Date: 06/22/18 Mean arterial Pressure 80s  Intake/Output:   Intake/Output Summary (Last 24 hours) at 06/24/2018 0728 Last data filed at 06/24/2018 0438 Gross per 24 hour  Intake 1426.21 ml  Output 250 ml  Net 1176.21 ml     Physical Exam    General:  NAD.  HEENT: normal  Neck: supple. JVP 8-9  Carotids 2+ bilat; no bruits. No lymphadenopathy or thryomegaly appreciated. Cor: LVAD hum.  Lungs: Clear. Abdomen: obese soft, nontender, non-distended. No hepatosplenomegaly. No bruits or masses. Good bowel sounds. Driveline site clean. Anchor in place.  Extremities: no cyanosis, clubbing, rash. 1+ edema  Neuro: alert & oriented x 3. No focal deficits. Moves all 4 without problem    Telemetry   Afib 80-90s, personally reviewed.   EKG    No new tracings.    Labs   Basic Metabolic Panel: Recent Labs  Lab 06/18/18 1054 06/22/18 0849 06/23/18 0526  NA 136 135 131*  K 3.8 3.9 3.8  CL 98 97* 97*  CO2 28 26 24   GLUCOSE 178* 326* 200*  BUN 26* 30* 34*  CREATININE 2.46* 2.83* 3.91*  CALCIUM 9.0 8.6* 8.3*    Liver Function Tests: Recent Labs  Lab 06/22/18 0849  AST 33  ALT 30  ALKPHOS 87  BILITOT 1.0  PROT 6.4*  ALBUMIN 3.1*   No results for input(s): LIPASE,  AMYLASE in the last 168 hours. No results for input(s): AMMONIA in the last 168 hours.  CBC: Recent Labs  Lab 06/18/18 1054 06/22/18 0849 06/23/18 0526  WBC 13.8* 10.2 11.4*  NEUTROABS  --  8.6*  --   HGB 10.1* 9.2* 8.9*  HCT 33.9* 30.8* 29.5*  MCV 95.0 94.5 93.4  PLT 169 159 156    INR: Recent Labs  Lab 06/18/18 1054 06/22/18 0849 06/23/18 0526  INR 1.32 1.37 1.39    Imaging   Dg Chest Portable 1 View  Result Date: 06/22/2018 CLINICAL DATA:  Acute onset shortness of breath with center chest tightness. Chest congestion. EXAM: PORTABLE CHEST 1 VIEW COMPARISON:  06/08/2018 and CT chest 11/25/2017. FINDINGS: Trachea is midline. Heart size stable. Left ventricular assist device is in place. Thoracic aorta is calcified. Right-sided ICD lead projects over the right ventricle. Lungs are somewhat low in volume with an elevated left hemidiaphragm. No airspace consolidation or pleural fluid. IMPRESSION: 1. No acute findings. 2.  Aortic atherosclerosis (ICD10-170.0). Electronically Signed   By: Lorin Picket M.D.   On: 06/22/2018 08:43   Dg Abd Portable 1 View  Result Date: 06/22/2018 CLINICAL DATA:  Abdominal pain.  Constipation. EXAM: PORTABLE ABDOMEN - 1 VIEW COMPARISON:  Plain films of the abdomen 03/29/2018. FINDINGS: The bowel gas pattern is nonobstructive. There is  a large volume of stool throughout the colon. No abnormal abdominal calcification is seen. Marked convex left thoracolumbar scoliosis is noted. Left ventricular assist device and AICD lead are noted. IMPRESSION: No acute finding. Moderately large colonic stool burden. Marked convex left scoliosis. Electronically Signed   By: Inge Rise M.D.   On: 06/22/2018 09:01     Medications:     Scheduled Medications: . atorvastatin  40 mg Oral q1800  . budesonide (PULMICORT) nebulizer solution  0.25 mg Nebulization BID  . busPIRone  5 mg Oral BID  . calcitRIOL  0.5 mcg Oral Q M,W,F-HD  . carvedilol  3.125 mg Oral BID  WC  . Chlorhexidine Gluconate Cloth  6 each Topical Q0600  . docusate sodium  200 mg Oral Daily  . doxycycline  100 mg Oral BID  . dronabinol  2.5 mg Oral BID AC  . insulin aspart  0-5 Units Subcutaneous QHS  . insulin aspart  0-9 Units Subcutaneous TID WC  . insulin glargine  4 Units Subcutaneous QHS  . insulin glargine  6 Units Subcutaneous Daily  . levothyroxine  25 mcg Oral QAC breakfast  . megestrol  40 mg Oral Daily  . multivitamin  1 tablet Oral QHS  . pantoprazole  40 mg Oral Daily  . senna-docusate  1 tablet Oral BID  . sildenafil  20 mg Oral TID  . traZODone  100 mg Oral QHS  . Warfarin - Pharmacist Dosing Inpatient   Does not apply q1800    Infusions: . heparin 13 Units/hr (06/24/18 0400)    PRN Medications: acetaminophen, albuterol, benzonatate, calcium carbonate, LORazepam, ondansetron (ZOFRAN) IV, polyethylene glycol, traMADol   Patient Profile   Trevor [name] is a 72 y.o. 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   Admitted 06/22/18 with abdominal pain, nausea, and vomiting.   Assessment/Plan:    1. Abdominal Pain - No obstructive pattern on ABD films. Large stool burden.  - ? Combination of volume, constipation, and doxycycline.  - Labs pending this am.  - Somewhat improved. May need further imaging if recurs/worsens. 2. Chronic systolic CHF: Ischemic cardiomyopathy - EF 20-25% with RV dysfunction on 2/18 echo pre-LVAD. 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 Medtronic ICD. He has had problems with RV failure post-op. He developed worsening renal dysfunction and finally had to start HD in 8/18. There has been some difficulty getting adequate fluid off him, currently  dialyzing 4 days/week. He was recently admitted with volume overload and had HD several days in a row. Per inpatient renal consult, planned to lower dry weight but this does not seem to have been carried over to his outpatient HD. NYHA III symptoms, stable. He does appear volume overloaded still.  - Volume status trending up. Needs HD today.  - Continue warfarin with INR goal 1.8 - 2.3. No ASA with GI bleeding.  - INR 1.39 yesterday. Not bridged with frequent GI bleeds. Pending today.  3. CAD: s/p CABG.  - No s/s of ischemia.    - He is on atorvastatin.  4. ESRD: - Ongoing HD 4 days/week, see above. - Nephro consulted and will see today. Will need HD with missing yesterday.  - Long discussion this  admit. Have contacted outside centers to see if pt is candidate for PD anywhere. If not, will likely have to make transition to [place].  5. Atrial fibrillation: Chronic.  - On warfarin. Pharm D dosing.  - Rate improved with low dose BB.  6. RV failure:  - Continue Revatio at 40 mg TID. No change.  7. HTN:  - MAPs stable.   8. 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  - Hgb8.9 this am. No overt bleeding.  9. GI bleeding: GI AVMs. Now s/p laparoscopic surgical enteroscopy with 4 clips on 6/7/19at [name], c/b ischemic bowel/SBO.  - Hgb 8.9 today. No overt bleeding.  - Tolerating warfarin. Goal INR 1.8-2.3, adjust to keep in this range.  - Getting Aranesp per nephrology.  - Continue octreotide  10. Squamous cell skin cancer: Neck excision site without clear margins. He saw Dr Lisbeth Renshaw with radiation oncology, radiation was recommended. He wants to hold off for the time being, does not think he can handle radiation as HD wears him out 4 days/week. He now has a nodule concerning for skin cancer on his forehead, will refer him back to [name]. No change.  11. H/o TAVR:  - Stable on last echo.  12. Septic Left Knee:s/p I&D  and surgical wash out at [facility] -Getting PT.  - Follow with ortho.  13. ID:  - Started on doxycycline 100 mg bid x 7 days 06/18/18. To finished today.   Awaiting word from outside centers to see if pt is candidate for PD anywhere. If not, will likely have to make transition to [place].   I reviewed the LVAD parameters from today, and compared the results to the patient's prior recorded data.  No programming changes were made.  The LVAD is functioning within specified parameters.  LVAD interrogation was negative for any significant power changes, alarms or PI events/speed drops.  LVAD equipment check completed and is in good working order.  Back-up equipment present.   LVAD education done on emergency procedures and precautions and reviewed exit site care.   Length of Stay: 2  Annamaria Helling 06/24/2018, 7:28 AM  VAD Team --- VAD ISSUES ONLY--- Pager [phone] (7am - 7am)  Advanced Heart Failure Team  Pager [phone] (M-F; 7a - 4p)  Please contact [facility] for night-coverage after hours (4p -7a ) and weekends on [email or web address]  Patient seen and examined with the above-signed Advanced Practice Provider and/or Housestaff. I personally reviewed laboratory data, imaging studies and relevant notes. I independently examined the patient and formulated the important aspects of the plan. I have edited the note to reflect any of my changes or salient points. I have personally discussed the plan with the patient and/or family.  Continues to struggle but feeling some better. Mild volume overload. Now in HD. We again discussed the fact that he does not want to continue outpatient HD in the long run and if that is the only option he would want Hospice. I have reached out personally to [facility] and [facility] to discuss possible placement of a PD catheter and both sites said they might consider it. We have faxed records to [facility] and they feel that his anatomy may be prohibitive to place a successful PD  catheter but would be willing to see him in consult as an outpatient. We are discussing logistics. I have also d/w with Drs. Coladonato and [place] and they would be willing to attempt  PD if we could get a catheter placed. We wil continue to f/u. If unable to get PD catheter then would engage Hospice and stop HD.  Glori Bickers, MD  8:10 AM

## 2018-06-24 NOTE — Progress Notes (Signed)
ANTICOAGULATION CONSULT NOTE   Pharmacy Consult for warfarin  Indication: LVAD  No Known Allergies  Patient Measurements: Height: 5\' 5"  (165.1 cm) Weight: 164 lb 14.5 oz (74.8 kg)(standing weight) IBW/kg (Calculated) : 61.5 Heparin Dosing Weight: 75 kg  Vital Signs: Temp: 97.8 F (36.6 C) (08/21 0749) Temp Source: Oral (08/21 0749) BP: 140/81 (08/21 0930) Pulse Rate: 95 (08/21 0930)  Labs: Recent Labs    06/22/18 0849  06/23/18 0526 06/23/18 1419 06/23/18 2344 06/24/18 0723 06/24/18 0747 06/24/18 0748  HGB 9.2*  --  8.9*  --   --   --  9.4*  --   HCT 30.8*  --  29.5*  --   --   --  30.7*  --   PLT 159  --  156  --   --   --  170  --   APTT 31  --   --   --   --   --   --   --   LABPROT 16.8*  --  17.0*  --   --   --  20.9*  --   INR 1.37  --  1.39  --   --   --  1.82  --   HEPARINUNFRC  --    < > <0.10* 0.22* 0.22* 0.41  --   --   CREATININE 2.83*  --  3.91*  --   --   --   --  4.43*   < > = values in this interval not displayed.    Estimated Creatinine Clearance: 14.2 mL/min (A) (by C-G formula based on SCr of 4.43 mg/dL (H)).   Medical History: Past Medical History:  Diagnosis Date  . AICD (automatic cardioverter/defibrillator) present   . Asthma   . AVM (arteriovenous malformation) of colon 07/07/2017  . CHF (congestive heart failure) ([place])   . Diabetes ([place])   . ESRF (end stage renal failure) ([place]) 07/07/2017   pt receiving hemodialysis Monday- Wednesday-friday, sometimes Saturday  . Kidney disease   . LVAD (left ventricular assist device) present ([place]) 12/2016  . Permanent atrial fibrillation ([place]) 07/07/2017  . Presence of permanent cardiac pacemaker    AICD  . Sleep apnea     Assessment: 51 yoM with LVAD and significant hx of GIB presents with abdominal pain and subtherapeutic INR. Pharmacy consulted to resume warfarin and start IV heparin bridge while INR low. INR 1.37 in clinic, PTA dose adjusted to 7.5mg  x1 then 5mg  daily. Pt reports taking  warfarin 8/19 prior to being admitted.  INR therapeutic at 1.82  CBC stable, no bleeding noted per nursing.  Goal of Therapy:  INR goal 1.8-2.3 Monitor platelets by anticoagulation protocol: Yes   Plan:  Warfarin 5 mg x1 Discontinue heparin drip with therapeutic INR Daily INR Monitor H/H, platelets, and signs/symptoms of bleeding.  Vertis Kelch, PharmD PGY1 Pharmacy Resident Phone [phone] 06/24/2018       10:00 AM

## 2018-06-25 ENCOUNTER — Other Ambulatory Visit (HOSPITAL_COMMUNITY): Payer: Medicare Other

## 2018-06-25 DIAGNOSIS — R112 Nausea with vomiting, unspecified: Secondary | ICD-10-CM

## 2018-06-25 LAB — BASIC METABOLIC PANEL
ANION GAP: 8 (ref 5–15)
BUN: 20 mg/dL (ref 8–23)
CALCIUM: 8.2 mg/dL — AB (ref 8.9–10.3)
CO2: 29 mmol/L (ref 22–32)
CREATININE: 3.02 mg/dL — AB (ref 0.61–1.24)
Chloride: 97 mmol/L — ABNORMAL LOW (ref 98–111)
GFR calc Af Amer: 22 mL/min — ABNORMAL LOW (ref >60)
GFR, EST NON AFRICAN AMERICAN: 19 mL/min — AB (ref >60)
GLUCOSE: 208 mg/dL — AB (ref 70–99)
Potassium: 3.8 mmol/L (ref 3.5–5.1)
Sodium: 134 mmol/L — ABNORMAL LOW (ref 135–145)

## 2018-06-25 LAB — CBC
HCT: 29.9 % — ABNORMAL LOW (ref 39.0–52.0)
Hemoglobin: 9 g/dL — ABNORMAL LOW (ref 13.0–17.0)
MCH: 28.2 pg (ref 26.0–34.0)
MCHC: 30.1 g/dL (ref 30.0–36.0)
MCV: 93.7 fL (ref 78.0–100.0)
PLATELETS: 160 10*3/uL (ref 150–400)
RBC: 3.19 MIL/uL — ABNORMAL LOW (ref 4.22–5.81)
RDW: 18.2 % — AB (ref 11.5–15.5)
WBC: 7.7 10*3/uL (ref 4.0–10.5)

## 2018-06-25 LAB — LACTATE DEHYDROGENASE: LDH: 191 U/L (ref 98–192)

## 2018-06-25 LAB — GLUCOSE, CAPILLARY
GLUCOSE-CAPILLARY: 85 mg/dL (ref 70–99)
Glucose-Capillary: 172 mg/dL — ABNORMAL HIGH (ref 70–99)
Glucose-Capillary: 216 mg/dL — ABNORMAL HIGH (ref 70–99)
Glucose-Capillary: 177 mg/dL — ABNORMAL HIGH (ref 70–99)

## 2018-06-25 LAB — MAGNESIUM: Magnesium: 1.7 mg/dL (ref 1.7–2.4)

## 2018-06-25 LAB — PROTIME-INR
INR: 2.15
Prothrombin Time: 23.9 seconds — ABNORMAL HIGH (ref 11.4–15.2)

## 2018-06-25 MED ORDER — CHLORHEXIDINE GLUCONATE CLOTH 2 % EX PADS: 6.0000 | MEDICATED_PAD | Freq: Every day | CUTANEOUS | Status: DC

## 2018-06-25 MED ORDER — MAGNESIUM SULFATE 2 GM/50ML IV SOLN
2.0000 g | Freq: Once | INTRAVENOUS | Status: AC
  Administered 2018-06-25: 2 g via INTRAVENOUS
  Filled 2018-06-25: qty 50

## 2018-06-25 MED ORDER — WARFARIN SODIUM 5 MG PO TABS
5.0000 mg | ORAL_TABLET | Freq: Once | ORAL | Status: AC
  Administered 2018-06-25: 5 mg via ORAL
  Filled 2018-06-25: qty 1

## 2018-06-25 NOTE — Progress Notes (Signed)
LVAD Coordinator Rounding Note:  HM II LVAD implanted on 12/13/16 by Dr. Darcey Nora under Destination Therapy criteria due to age excluding heart transplantation.  Pt admitted 06/22/18 for nausea and vomiting.   Pt sitting on side of bed eating breakfast. [name] in to assess and update patient. Pt reports he tolerated HD dialysis yesterday "okay".   Vital signs: Temp:  98.7 HR: 86 - afib Automatic BP:   Doppler:  O2 Sat: 96 % RA Wt: 78.2>74.8>72.7 kgs  LVAD interrogation reveals:  Speed:  9200 Flow:  4.7 Power: 5.3w PI: 6.0 Alarms: none Events: 5 PI events  Fixed speed: 9200 Low speed limit: 8600   Drive Line:  Right abd dressing CDI. Next dressing change due 06/29/18. Bedside nurse may change dressing.   Labs:  LDH trend:  251>217>205>203>191  INR trend: 1.73>1.96>1.91>1.85>2.15  Anticoagulation Plan: - INR Goal: 1.8-2.3 - ASA Dose: none due to hx of GI bleed - OP monthly Octreotide for hx of GI bleed  Blood Products:  None this admission  Device: - Medtronic single lead -Therapies: on 231 bpm  Arrythmias: chronic afib  Renal:  - chronic HD on M/W/F/Sat at [facility] on [facility]  Adverse Events on VAD: - 02/2017> GIB- AVM clipped x2 - 07/2017> renal failure- HD started - 11/2017>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 Margins not clear at neck. - 02/23/18> hospitalization for GI bleed. Colon/EGD 02/27/18 with normal EGD. Colonoscopy identified active bleeding in ileum; source not found. Capsule Endoscopy 02/27/18: Showed active bleeding in the ileum. Received 5 units blood with histamine reaction; will give benadryl as OP. - 03/10/18>transferrred to [facility] for double retro balloon scop and sent to IR emolization x 3 with no evidence of bleeding. Received 12 units PCs, remained off anticoagulants. Transferred back to [facility] 03/20/18 Seen by IR 5/20. CTA obtained, but no active bleeding found. Started on octreotide. 03/25/18: Repeat  capsule endoscopy completed. Bleeding appears to come from the mid-ileum. 03/26/18: General surgery reconsulted. Recommended transfer back to [facility] for surgery with intraoperative endoscopy. [name] currently discussing options. Ortho consulted 5/24 for left knee pain. S/p knee aspiration and steroid injection.  Dose of premarin given 5/24.  On 5/25 developed abdominal bloating and distension with n/v. KUB suggestive of ileus versus early SBO. NGT placed for decompression.  Had large bloody BM after this and felt much better, diet now advanced to regular.  - 04/01/18 transferred to [facility] for  laparoscopic surgical enteroscopy for recurrent GI bleed   Plan/Recommendations: 1.  Weekly dressing changes per bedside nurse. Next dressing change due 06/29/18. 2.  Call VAD pager if any questions re: VAD equipment or drive line site issues.   [name] RN, VAD Coordinator 24/7 VAD pager: [phone]

## 2018-06-25 NOTE — Progress Notes (Addendum)
Advanced Heart Failure VAD Team Note  PCP-Cardiologist: No primary care provider on file.   Subjective:    Abdomen feeling gradually better. No N/V, but some diarrhea last night. Denies SOB. Tolerated HD well yesterday. More energy today. No fevers or chills.   ABD Portable 1V 06/22/18 - No acute finding. Moderately large colonic stool burden.   CXR 06/22/18 - No acute findings.   LVAD2 Interrogation HM 3: Speed: 9200 Flow: 4.4 PI: 4.8 Power: 5.0. No PI events   Objective:    Vital Signs:   Temp:  [97.3 F (36.3 C)-98.8 F (37.1 C)] 98.3 F (36.8 C) (08/22 0257) Pulse Rate:  [36-114] 79 (08/22 0705) Resp:  [11-26] 11 (08/22 0705) BP: (70-140)/(50-104) 70/57 (08/22 0257) SpO2:  [94 %-99 %] 99 % (08/22 0705) Weight:  [72.6 kg-74.8 kg] 72.7 kg (08/22 0523) Last BM Date: 06/24/18 Mean arterial Pressure 60-70s overnight.   Intake/Output:   Intake/Output Summary (Last 24 hours) at 06/25/2018 0730 Last data filed at 06/25/2018 0400 Gross per 24 hour  Intake 715.17 ml  Output 1877 ml  Net -1161.83 ml     Physical Exam    General:  NAD.  HEENT: normal  Neck: supple. JVP not elevated.  Carotids 2+ bilat; no bruits. No lymphadenopathy or thryomegaly appreciated. Cor: LVAD hum.  Lungs: Clear. Abdomen: obese soft, nontender, non-distended. No hepatosplenomegaly. No bruits or masses. Good bowel sounds. Driveline site clean. Anchor in place.  Extremities: no cyanosis, clubbing, rash. Warm no 1+ edema  RUE AVF Neuro: alert & oriented x 3. No focal deficits. Moves all 4 without problem    Telemetry   Afib 80s-90s, personally reviewed.   EKG    No new tracings.    Labs   Basic Metabolic Panel: Recent Labs  Lab 06/18/18 1054 06/22/18 0849 06/23/18 0526 06/24/18 0748 06/24/18 1823 06/25/18 0248  NA 136 135 131* 133*  --  134*  K 3.8 3.9 3.8 4.1  --  3.8  CL 98 97* 97* 96*  --  97*  CO2 28 26 24 23   --  29  GLUCOSE 178* 326* 200* 118*  --  208*  BUN 26* 30*  34* 41*  --  20  CREATININE 2.46* 2.83* 3.91* 4.43*  --  3.02*  CALCIUM 9.0 8.6* 8.3* 8.5*  --  8.2*  MG  --   --   --   --  1.8 1.7  PHOS  --   --   --  5.3*  --   --     Liver Function Tests: Recent Labs  Lab 06/22/18 0849 06/24/18 0748  AST 33  --   ALT 30  --   ALKPHOS 87  --   BILITOT 1.0  --   PROT 6.4*  --   ALBUMIN 3.1* 3.1*   No results for input(s): LIPASE, AMYLASE in the last 168 hours. No results for input(s): AMMONIA in the last 168 hours.  CBC: Recent Labs  Lab 06/18/18 1054 06/22/18 0849 06/23/18 0526 06/24/18 0747 06/25/18 0248  WBC 13.8* 10.2 11.4* 9.9 7.7  NEUTROABS  --  8.6*  --   --   --   HGB 10.1* 9.2* 8.9* 9.4* 9.0*  HCT 33.9* 30.8* 29.5* 30.7* 29.9*  MCV 95.0 94.5 93.4 92.7 93.7  PLT 169 159 156 170 160    INR: Recent Labs  Lab 06/18/18 1054 06/22/18 0849 06/23/18 0526 06/24/18 0747 06/25/18 0248  INR 1.32 1.37 1.39 1.82 2.15    Imaging  No results found.   Medications:     Scheduled Medications: . atorvastatin  40 mg Oral q1800  . budesonide (PULMICORT) nebulizer solution  0.25 mg Nebulization BID  . busPIRone  5 mg Oral BID  . calcitRIOL  0.5 mcg Oral Q M,W,F-HD  . carvedilol  3.125 mg Oral BID WC  . Chlorhexidine Gluconate Cloth  6 each Topical Q0600  . docusate sodium  200 mg Oral Daily  . dronabinol  2.5 mg Oral BID AC  . insulin aspart  0-5 Units Subcutaneous QHS  . insulin aspart  0-9 Units Subcutaneous TID WC  . insulin glargine  4 Units Subcutaneous QHS  . insulin glargine  6 Units Subcutaneous Daily  . levothyroxine  25 mcg Oral QAC breakfast  . megestrol  40 mg Oral Daily  . multivitamin  1 tablet Oral QHS  . pantoprazole  40 mg Oral Daily  . senna-docusate  1 tablet Oral BID  . sildenafil  20 mg Oral TID  . traZODone  100 mg Oral QHS  . Warfarin - Pharmacist Dosing Inpatient   Does not apply q1800    Infusions:   PRN Medications: acetaminophen, albuterol, benzonatate, calcium carbonate, LORazepam,  ondansetron (ZOFRAN) IV, polyethylene glycol, traMADol   Patient Profile   Trevor [name] is a 72 y.o. 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   Admitted 06/22/18 with abdominal pain, nausea, and vomiting.   Assessment/Plan:    1. Abdominal Pain - No obstructive pattern on ABD films. Large stool burden.  - ? Combination of volume, constipation, and doxycycline. Now off doxy and constipation relieved. - Has improved. Yesterday was having diarrhea.  2. Chronic systolic CHF: Ischemic cardiomyopathy - EF 20-25% with RV dysfunction on 2/18 echo pre-LVAD. 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 Medtronic ICD. He has had problems with RV failure post-op. He developed worsening renal dysfunction and finally had to start HD in 8/18. There has been some difficulty getting adequate fluid off him, currently dialyzing 4 days/week. He was recently admitted with volume overload and had HD several days in a row. Per inpatient renal consult, planned to lower dry weight but this does not seem to have been carried over to his outpatient HD. NYHA III symptoms, stable. He does appear volume overloaded still.  - Volume status OK. Per HD.  - Continue warfarin with INR goal 1.8 - 2.3. No ASA with GI bleeding.  - INR 2.15. Dosing per pharm.  3. CAD: s/p CABG.  No s/s of ischemia.    - He is on atorvastatin.  4. ESRD: - Ongoing HD 4 days/week, see above. - Nephro following.  - Long discussion this admit. Have contacted outside centers to see if pt is candidate for PD anywhere. If not, will likely have to make transition to [place].  - Have faxed [facility] to [facility] and [facility] for consideration. [facility] has agreed to at least  see in outpatient follow up, but worry anatomy is prohibitive. 5. Atrial fibrillation: Chronic.  - On warfarin. Pharm D dosing.  - Rate improved with low dose BB.  6. RV failure:  - Continue Revatio at 40 mg TID. No change.  7. HTN:  - MAPs lower  today. Follow.  8. 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  - Hgb9.0 this am. No overt bleeding.   9. GI bleeding: GI AVMs. Now s/p laparoscopic surgical enteroscopy with 4 clips on 6/7/19at [name], c/b ischemic bowel/SBO.  - Hgb 9.0 today. No overt bleeding.  - Tolerating warfarin. Goal INR 1.8-2.3, adjust to keep in this range.  - Getting Aranesp per nephrology.  - Continue octreotide  10. Squamous cell skin cancer: Neck excision site without clear margins. He saw Dr Lisbeth Renshaw with radiation oncology, radiation was recommended. He wants to hold off for the time being, does not think he can handle radiation as HD wears him out 4 days/week. He now has a nodule concerning for skin cancer on his forehead, will refer him back to [name]. No change.   11. H/o TAVR:  - Stable on last echo.  12. Septic Left Knee:s/p I&D and surgical wash out at [facility] -Getting PT.  - Follow with ortho.  13. ID:  - Finished doxycycline 100 mg bid x 7 days.  I reviewed the LVAD parameters from today, and compared the results to the patient's prior recorded data.  No programming changes were made.  The LVAD is functioning within specified parameters. LVAD interrogation was negative for any significant power changes, alarms or PI events/speed drops.  LVAD equipment check completed and is in good working order.  Back-up equipment present.   LVAD education done on emergency procedures and precautions and reviewed exit site care.   Length of Stay: 3  Annamaria Helling 06/25/2018, 7:30 AM  VAD Team --- VAD ISSUES ONLY--- Pager [phone] (7am - 7am)  Advanced Heart Failure Team  Pager [phone]  (M-F; 7a - 4p)  Please contact [facility] for night-coverage after hours (4p -7a ) and weekends on [email or web address]    Patient seen and examined with the above-signed Advanced Practice Provider and/or Housestaff. I personally reviewed laboratory data, imaging studies and relevant notes. I independently examined the patient and formulated the important aspects of the plan. I have edited the note to reflect any of my changes or salient points. I have personally discussed the plan with the patient and/or family.  Tolerated HD better yesterday. Still feels weak but improving. No further nausea/vomitting. Volume status ok. VAD interrogated personally. Parameters stable. We are still sorting through wheher or not he will be candidate for PD catheter placement. If not, he has decided to proceed with Hospice. Multiple discussions with [facility] and [place] teams about this. INR 2.1 today. Will need to stop coumadin if he is candidate for PD catheter placement.Discussed dosing with PharmD personally.   Glori Bickers, MD  8:37 AM

## 2018-06-25 NOTE — Progress Notes (Signed)
[facility] Progress Note  Subjective: abd pain a little better, had HD yest w/ 1.8 L off, no cramping or lightheadedness, lowest BP's yest in 70's.    Vitals:   06/25/18 0523 06/25/18 0705 06/25/18 0727 06/25/18 0806  BP:   98/87   Pulse:  79 88 88  Resp:  11 16   Temp:   98.7 F (37.1 C)   TempSrc:   Oral   SpO2:  99% 95%   Weight: 72.7 kg     Height:        Inpatient medications: . atorvastatin  40 mg Oral q1800  . budesonide (PULMICORT) nebulizer solution  0.25 mg Nebulization BID  . busPIRone  5 mg Oral BID  . calcitRIOL  0.5 mcg Oral Q M,W,F-HD  . carvedilol  3.125 mg Oral BID WC  . Chlorhexidine Gluconate Cloth  6 each Topical Q0600  . docusate sodium  200 mg Oral Daily  . dronabinol  2.5 mg Oral BID AC  . insulin aspart  0-5 Units Subcutaneous QHS  . insulin aspart  0-9 Units Subcutaneous TID WC  . insulin glargine  4 Units Subcutaneous QHS  . insulin glargine  6 Units Subcutaneous Daily  . levothyroxine  25 mcg Oral QAC breakfast  . megestrol  40 mg Oral Daily  . multivitamin  1 tablet Oral QHS  . pantoprazole  40 mg Oral Daily  . senna-docusate  1 tablet Oral BID  . sildenafil  20 mg Oral TID  . traZODone  100 mg Oral QHS  . warfarin  5 mg Oral ONCE-1800  . Warfarin - Pharmacist Dosing Inpatient   Does not apply q1800   . magnesium sulfate 1 - 4 g bolus IVPB     acetaminophen, albuterol, benzonatate, calcium carbonate, LORazepam, ondansetron (ZOFRAN) IV, polyethylene glycol, traMADol  Iron/TIBC/Ferritin/ %Sat    Component Value Date/Time   IRON 125 06/14/2017 1605   TIBC 221 (L) 06/14/2017 1605   FERRITIN 622 (H) 06/14/2017 1605   IRONPCTSAT 57 (H) 06/14/2017 1605    Exam: Genalert, not in distress, calm, tired No jvd or bruits Chest clear on L, R base decreased Cor LVAD hum Abd soft ntnd no mass or ascites +bsy Ext trace edema R pretib and 1+ L pretib Neuro is alert, Ox 3 , nf   Home meds: - atorvastatin 40 hs//  levothyroxine 25 ug / megestrol 40 / pantoprazole 40 qd  - trazodone 100 mg hs/ tramadol 50 qid prn/ lorazepam 0.5 tid prn/ buspirone 5 bid - sildenafil 20 mg tid - insulin glargine 4u am and 6u pm  - warfarin 5 mg qd  - albuterol prn / flovent prn/  fluticasone 2 puff bid  - other prn's/ vitamins  Dialysis:MWFSat [facility] 4.5h 73.5kg LUA AVF Hep none - mircera 225 last on 8/5  - calc 0.5ug tiw  CXR 8/19 > clear, no acute   Impression: 1  N/V/ abd pain - per primary  2 ESRD - MWFSat HD. HD tomorrow.  3 ICM/ sp LVAD 4 CAD hx CABG 5 AS sp TAVRS 6 DM2 7  Hx recurrent GIB - stable Hb ~9,  no signs of bleeding 8  Nutrition - doesn't need renal diet, pt request 9  Volume - mild edema L pretib, much better than in the best, 1kg under dry wt, clear lungs. Min UF on HD tomorrow.   Plan - as above   Kelly Splinter MD [employer] pager [phone]   06/25/2018, 10:49 AM  Recent Labs  Lab 06/22/18 0849  06/24/18 0747 06/24/18 0748 06/25/18 0248  NA 135   < >  --  133* 134*  K 3.9   < >  --  4.1 3.8  CL 97*   < >  --  96* 97*  CO2 26   < >  --  23 29  GLUCOSE 326*   < >  --  118* 208*  BUN 30*   < >  --  41* 20  CREATININE 2.83*   < >  --  4.43* 3.02*  CALCIUM 8.6*   < >  --  8.5* 8.2*  PHOS  --   --   --  5.3*  --   ALBUMIN 3.1*  --   --  3.1*  --   INR 1.37   < > 1.82  --  2.15   < > = values in this interval not displayed.   Recent Labs  Lab 06/22/18 0849  AST 33  ALT 30  ALKPHOS 87  BILITOT 1.0  PROT 6.4*   Recent Labs  Lab 06/22/18 0849  06/24/18 0747 06/25/18 0248  WBC 10.2   < > 9.9 7.7  NEUTROABS 8.6*  --   --   --   HGB 9.2*   < > 9.4* 9.0*  HCT 30.8*   < > 30.7* 29.9*  MCV 94.5   < > 92.7 93.7  PLT 159   < > 170 160   < > = values in this interval not displayed.

## 2018-06-25 NOTE — Progress Notes (Signed)
ANTICOAGULATION CONSULT NOTE   Pharmacy Consult for warfarin  Indication: LVAD  No Known Allergies  Patient Measurements: Height: 5\' 5"  (165.1 cm) Weight: 160 lb 4.4 oz (72.7 kg)(scaleA) IBW/kg (Calculated) : 61.5 Heparin Dosing Weight: 75 kg  Vital Signs: Temp: 98.7 F (37.1 C) (08/22 0727) Temp Source: Oral (08/22 0727) BP: 98/87 (08/22 0727) Pulse Rate: 88 (08/22 0806)  Labs: Recent Labs    06/23/18 0526 06/23/18 1419 06/23/18 2344 06/24/18 0723 06/24/18 0747 06/24/18 0748 06/25/18 0248  HGB 8.9*  --   --   --  9.4*  --  9.0*  HCT 29.5*  --   --   --  30.7*  --  29.9*  PLT 156  --   --   --  170  --  160  LABPROT 17.0*  --   --   --  20.9*  --  23.9*  INR 1.39  --   --   --  1.82  --  2.15  HEPARINUNFRC <0.10* 0.22* 0.22* 0.41  --   --   --   CREATININE 3.91*  --   --   --   --  4.43* 3.02*    Estimated Creatinine Clearance: 19.2 mL/min (A) (by C-G formula based on SCr of 3.02 mg/dL (H)).   Medical History: Past Medical History:  Diagnosis Date  . AICD (automatic cardioverter/defibrillator) present   . Asthma   . AVM (arteriovenous malformation) of colon 07/07/2017  . CHF (congestive heart failure) ([place])   . Diabetes ([place])   . ESRF (end stage renal failure) ([place]) 07/07/2017   pt receiving hemodialysis Monday- Wednesday-friday, sometimes Saturday  . Kidney disease   . LVAD (left ventricular assist device) present ([place]) 12/2016  . Permanent atrial fibrillation ([place]) 07/07/2017  . Presence of permanent cardiac pacemaker    AICD  . Sleep apnea     Assessment: 17 yoM with LVAD and significant hx of GIB presents with abdominal pain and subtherapeutic INR. Pharmacy consulted to resume warfarin and start IV heparin bridge while INR low. INR 1.37 in clinic, PTA dose adjusted to 7.5mg  x1 then 5mg  daily. Pt reports taking warfarin 8/19 prior to being admitted.  INR therapeutic at 2.15  CBC stable, no bleeding noted per nursing.  Goal of Therapy:  INR goal  1.8-2.3 Monitor platelets by anticoagulation protocol: Yes   Plan:  Warfarin 5 mg x1 May need to hold warfarin if PD candidate Daily INR Monitor H/H, platelets, and signs/symptoms of bleeding.  Vertis Kelch, PharmD PGY1 Pharmacy Resident Phone [phone] 06/25/2018       9:58 AM

## 2018-06-26 DIAGNOSIS — G43A0 Cyclical vomiting, not intractable: Secondary | ICD-10-CM

## 2018-06-26 LAB — BASIC METABOLIC PANEL
ANION GAP: 9 (ref 5–15)
BUN: 28 mg/dL — ABNORMAL HIGH (ref 8–23)
CALCIUM: 8.1 mg/dL — AB (ref 8.9–10.3)
CO2: 27 mmol/L (ref 22–32)
Chloride: 98 mmol/L (ref 98–111)
Creatinine, Ser: 3.73 mg/dL — ABNORMAL HIGH (ref 0.61–1.24)
GFR calc non Af Amer: 15 mL/min — ABNORMAL LOW (ref >60)
GFR, EST AFRICAN AMERICAN: 17 mL/min — AB (ref >60)
Glucose, Bld: 171 mg/dL — ABNORMAL HIGH (ref 70–99)
Potassium: 3.7 mmol/L (ref 3.5–5.1)
SODIUM: 134 mmol/L — AB (ref 135–145)

## 2018-06-26 LAB — GLUCOSE, CAPILLARY
GLUCOSE-CAPILLARY: 149 mg/dL — AB (ref 70–99)
GLUCOSE-CAPILLARY: 252 mg/dL — AB (ref 70–99)
Glucose-Capillary: 152 mg/dL — ABNORMAL HIGH (ref 70–99)

## 2018-06-26 LAB — CBC
HEMATOCRIT: 29 % — AB (ref 39.0–52.0)
Hemoglobin: 8.7 g/dL — ABNORMAL LOW (ref 13.0–17.0)
MCH: 28.2 pg (ref 26.0–34.0)
MCHC: 30 g/dL (ref 30.0–36.0)
MCV: 93.9 fL (ref 78.0–100.0)
Platelets: 142 10*3/uL — ABNORMAL LOW (ref 150–400)
RBC: 3.09 MIL/uL — ABNORMAL LOW (ref 4.22–5.81)
RDW: 18.1 % — AB (ref 11.5–15.5)
WBC: 6.9 10*3/uL (ref 4.0–10.5)

## 2018-06-26 LAB — PROTIME-INR
INR: 1.92
Prothrombin Time: 21.8 seconds — ABNORMAL HIGH (ref 11.4–15.2)

## 2018-06-26 LAB — LACTATE DEHYDROGENASE: LDH: 193 U/L — ABNORMAL HIGH (ref 98–192)

## 2018-06-26 LAB — HEPATITIS B SURFACE ANTIGEN: HEP B S AG: NEGATIVE

## 2018-06-26 MED ORDER — CALCITRIOL 0.5 MCG PO CAPS
ORAL_CAPSULE | ORAL | Status: AC
  Filled 2018-06-26: qty 1

## 2018-06-26 MED ORDER — WARFARIN SODIUM 5 MG PO TABS: 5.0000 mg | ORAL_TABLET | Freq: Once | ORAL | Status: DC

## 2018-06-26 NOTE — Progress Notes (Signed)
[facility] Progress Note  Subjective: on HD now, no new c/o's.   Vitals:   06/26/18 0900 06/26/18 0930 06/26/18 1000 06/26/18 1030  BP: 106/78 112/90 109/81 97/69  Pulse: 75 73 67 76  Resp: 11 17 11 10   Temp:      TempSrc:      SpO2: 100% 100% 100% 100%  Weight:      Height:        Inpatient medications: . atorvastatin  40 mg Oral q1800  . budesonide (PULMICORT) nebulizer solution  0.25 mg Nebulization BID  . busPIRone  5 mg Oral BID  . calcitRIOL      . calcitRIOL  0.5 mcg Oral Q M,W,F-HD  . carvedilol  3.125 mg Oral BID WC  . Chlorhexidine Gluconate Cloth  6 each Topical Q0600  . docusate sodium  200 mg Oral Daily  . dronabinol  2.5 mg Oral BID AC  . insulin aspart  0-5 Units Subcutaneous QHS  . insulin aspart  0-9 Units Subcutaneous TID WC  . insulin glargine  4 Units Subcutaneous QHS  . insulin glargine  6 Units Subcutaneous Daily  . levothyroxine  25 mcg Oral QAC breakfast  . megestrol  40 mg Oral Daily  . multivitamin  1 tablet Oral QHS  . pantoprazole  40 mg Oral Daily  . senna-docusate  1 tablet Oral BID  . sildenafil  20 mg Oral TID  . traZODone  100 mg Oral QHS  . warfarin  5 mg Oral ONCE-1800  . Warfarin - Pharmacist Dosing Inpatient   Does not apply q1800    acetaminophen, albuterol, benzonatate, calcium carbonate, LORazepam, ondansetron (ZOFRAN) IV, polyethylene glycol, traMADol  Iron/TIBC/Ferritin/ %Sat    Component Value Date/Time   IRON 125 06/14/2017 1605   TIBC 221 (L) 06/14/2017 1605   FERRITIN 622 (H) 06/14/2017 1605   IRONPCTSAT 57 (H) 06/14/2017 1605    Exam: Genalert, not in distress, calm, tired No jvd or bruits Chest clear on L, R base decreased Cor LVAD hum Abd soft ntnd no mass or ascites +bsy Ext trace edema R pretib and 1+ L pretib Neuro is alert, Ox 3 , nf   Home meds: - atorvastatin 40 hs// levothyroxine 25 ug / megestrol 40 / pantoprazole 40 qd  - trazodone 100 mg hs/ tramadol 50 qid prn/ lorazepam  0.5 tid prn/ buspirone 5 bid - sildenafil 20 mg tid - insulin glargine 4u am and 6u pm  - warfarin 5 mg qd  - albuterol prn / flovent prn/  fluticasone 2 puff bid  - other prn's/ vitamins  Dialysis:MWFSat [facility] 4.5h 73.5kg LUA AVF Hep none - mircera 225 last on 8/5  - calc 0.5ug tiw  CXR 8/19 > clear, no acute   Impression: 1  N/V/ abd pain - per primary  2 ESRD - MWFSat HD. HD today. 3 ICM/ sp LVAD 4 CAD hx CABG 5 AS sp TAVRS 6 DM2 7  Hx recurrent GIB - stable Hb ~9,  no signs of bleeding 8  Nutrition - doesn't need renal diet, pt request 9  Volume - at dry wt, mild LE edema, UF 1-1.5 L today per pt request as tolerated.   Plan - as above   Kelly Splinter MD [place] pager [phone]   06/26/2018, 10:49 AM   Recent Labs  Lab 06/22/18 0849  06/24/18 0748 06/25/18 0248 06/26/18 0219  NA 135   < > 133* 134* 134*  K 3.9   < >  4.1 3.8 3.7  CL 97*   < > 96* 97* 98  CO2 26   < > 23 29 27   GLUCOSE 326*   < > 118* 208* 171*  BUN 30*   < > 41* 20 28*  CREATININE 2.83*   < > 4.43* 3.02* 3.73*  CALCIUM 8.6*   < > 8.5* 8.2* 8.1*  PHOS  --   --  5.3*  --   --   ALBUMIN 3.1*  --  3.1*  --   --   INR 1.37   < >  --  2.15 1.92   < > = values in this interval not displayed.   Recent Labs  Lab 06/22/18 0849  AST 33  ALT 30  ALKPHOS 87  BILITOT 1.0  PROT 6.4*   Recent Labs  Lab 06/22/18 0849  06/25/18 0248 06/26/18 0219  WBC 10.2   < > 7.7 6.9  NEUTROABS 8.6*  --   --   --   HGB 9.2*   < > 9.0* 8.7*  HCT 30.8*   < > 29.9* 29.0*  MCV 94.5   < > 93.7 93.9  PLT 159   < > 160 142*   < > = values in this interval not displayed.

## 2018-06-26 NOTE — Progress Notes (Addendum)
ANTICOAGULATION CONSULT NOTE   Pharmacy Consult for warfarin  Indication: LVAD  No Known Allergies  Patient Measurements: Height: 5\' 5"  (165.1 cm) Weight: 162 lb 4.1 oz (73.6 kg) IBW/kg (Calculated) : 61.5 Heparin Dosing Weight: 75 kg  Vital Signs: Temp: 98.4 F (36.9 C) (08/23 0307) Temp Source: Oral (08/23 0307) BP: 97/84 (08/23 0307) Pulse Rate: 72 (08/23 0307)  Labs: Recent Labs    06/23/18 1419 06/23/18 2344 06/24/18 0723  06/24/18 0747 06/24/18 0748 06/25/18 0248 06/26/18 0219  HGB  --   --   --    < > 9.4*  --  9.0* 8.7*  HCT  --   --   --   --  30.7*  --  29.9* 29.0*  PLT  --   --   --   --  170  --  160 142*  LABPROT  --   --   --   --  20.9*  --  23.9* 21.8*  INR  --   --   --   --  1.82  --  2.15 1.92  HEPARINUNFRC 0.22* 0.22* 0.41  --   --   --   --   --   CREATININE  --   --   --   --   --  4.43* 3.02* 3.73*   < > = values in this interval not displayed.    Estimated Creatinine Clearance: 15.6 mL/min (A) (by C-G formula based on SCr of 3.73 mg/dL (H)).   Medical History: Past Medical History:  Diagnosis Date  . AICD (automatic cardioverter/defibrillator) present   . Asthma   . AVM (arteriovenous malformation) of colon 07/07/2017  . CHF (congestive heart failure) ([place])   . Diabetes ([place])   . ESRF (end stage renal failure) ([place]) 07/07/2017   pt receiving hemodialysis Monday- Wednesday-friday, sometimes Saturday  . Kidney disease   . LVAD (left ventricular assist device) present ([place]) 12/2016  . Permanent atrial fibrillation ([place]) 07/07/2017  . Presence of permanent cardiac pacemaker    AICD  . Sleep apnea     Assessment: 14 yoM with LVAD and significant hx of GIB presents with abdominal pain and subtherapeutic INR. Pharmacy consulted to resume warfarin.   INR therapeutic at 1.9  CBC stable, no bleeding noted per nursing.  Goal of Therapy:  INR goal 1.8-2.3 Monitor platelets by anticoagulation protocol: Yes   Plan:  Warfarin 5 mg x1 If  discharged today would resume home dose of 5mg  daily except 2.5mg  on Tuesdays. Would recheck INR with follow up VAD appointment   Erin Hearing PharmD., BCPS Clinical Pharmacist 06/26/2018 7:29 AM

## 2018-06-26 NOTE — Progress Notes (Addendum)
Advanced Heart Failure VAD Team Note  PCP-Cardiologist: No primary care provider on file.   Subjective:    Seen on HD. Tolerating OK today after 1.5 hrs. Anxious to hear news about PD. He would like to have long discussion about therapies the plan ahead once we hear from outside facility.   ABD Portable 1V 06/22/18 - No acute finding. Moderately large colonic stool burden.   CXR 06/22/18 - No acute findings.   LVAD Interrogation HM 3: Speed: 9200 Flow: 5.3 PI: 4.1 Power: 6.0. - VAD interrogated personally. Parameters stable.    Objective:    Vital Signs:                 Temp:  [98 F (36.7 C)-98.7 F (37.1 C)] 98.4 F (36.9 C) (08/23 0307) Pulse Rate:  [72-88] 72 (08/23 0307) Resp:  [12-16] 12 (08/23 0307) BP: (86-105)/(69-87) 97/84 (08/23 0307) SpO2:  [95 %-99 %] 99 % (08/23 0307) Weight:  [73.6 kg] 73.6 kg (08/23 0525) Last BM Date: 06/25/18 Mean arterial Pressure 80s-90s mostly. Episodes of MAPs in 58s yesterday.   Intake/Output:             Intake/Output Summary (Last 24 hours) at 06/26/2018 0725 Last data filed at 06/26/2018 0525    Gross per 24 hour  Intake -  Output 175 ml  Net -175 ml                Physical Exam    General: NAD.  HEENT: Normal. Neck: Supple, JVP not elevated. Carotids OK.  Cardiac:  Mechanical heart sounds with LVAD hum present.  Lungs:  CTAB, normal effort.  Abdomen: Obese, NT, ND, no HSM. No bruits or masses. +BS  LVAD exit site: Dressing dry and intact. No erythema or drainage. Stabilization device present and accurately applied. Driveline dressing changed daily per sterile technique. Extremities:  Warm and dry. No cyanosis, clubbing, or rash. Warm no 1+ edema, RUE AVF Neuro:  Alert & oriented x 3. Cranial nerves grossly intact. Moves all 4 extremities w/o difficulty. Affect pleasant     Telemetry   Afib 70-80s, personally reviewed.   EKG    No new tracings.   Labs   Basic Metabolic Panel: LastLabs     Recent Labs  Lab 06/22/18 0849 06/23/18 0526 06/24/18 0748 06/24/18 1823 06/25/18 0248 06/26/18 0219  NA 135 131* 133*  --  134* 134*  K 3.9 3.8 4.1  --  3.8 3.7  CL 97* 97* 96*  --  97* 98  CO2 26 24 23   --  29 27  GLUCOSE 326* 200* 118*  --  208* 171*  BUN 30* 34* 41*  --  20 28*  CREATININE 2.83* 3.91* 4.43*  --  3.02* 3.73*  CALCIUM 8.6* 8.3* 8.5*  --  8.2* 8.1*  MG  --   --   --  1.8 1.7  --   PHOS  --   --  5.3*  --   --   --       Liver Function Tests: LastLabs      Recent Labs  Lab 06/22/18 0849 06/24/18 0748  AST 33  --   ALT 30  --   ALKPHOS 87  --   BILITOT 1.0  --   PROT 6.4*  --   ALBUMIN 3.1* 3.1*     LastLabs  No results for input(s): LIPASE, AMYLASE in the last 168 hours.   LastLabs  No results for input(s): AMMONIA in the  last 168 hours.    CBC: LastLabs         Recent Labs  Lab 06/22/18 0849 06/23/18 0526 06/24/18 0747 06/25/18 0248 06/26/18 0219  WBC 10.2 11.4* 9.9 7.7 6.9  NEUTROABS 8.6*  --   --   --   --   HGB 9.2* 8.9* 9.4* 9.0* 8.7*  HCT 30.8* 29.5* 30.7* 29.9* 29.0*  MCV 94.5 93.4 92.7 93.7 93.9  PLT 159 156 170 160 142*      INR: LastLabs         Recent Labs  Lab 06/22/18 0849 06/23/18 0526 06/24/18 0747 06/25/18 0248 06/26/18 0219  INR 1.37 1.39 1.82 2.15 1.92      Imaging     ImagingResults(Last48hours)  No results found.     Medications:     Scheduled Medications:  .  atorvastatin   40 mg  Oral  q1800   .  budesonide (PULMICORT) nebulizer solution   0.25 mg  Nebulization  BID   .  busPIRone   5 mg  Oral  BID   .  calcitRIOL   0.5 mcg  Oral  Q M,W,F-HD   .  carvedilol   3.125 mg  Oral  BID WC   .  Chlorhexidine Gluconate Cloth   6 each  Topical  Q0600   .  docusate sodium   200 mg  Oral  Daily   .  dronabinol   2.5 mg  Oral  BID AC   .  insulin aspart   0-5 Units  Subcutaneous  QHS   .  insulin aspart   0-9 Units   Subcutaneous  TID WC   .  insulin glargine   4 Units  Subcutaneous  QHS   .  insulin glargine   6 Units  Subcutaneous  Daily   .  levothyroxine   25 mcg  Oral  QAC breakfast   .  megestrol   40 mg  Oral  Daily   .  multivitamin   1 tablet  Oral  QHS   .  pantoprazole   40 mg  Oral  Daily   .  senna-docusate   1 tablet  Oral  BID   .  sildenafil   20 mg  Oral  TID   .  traZODone   100 mg  Oral  QHS   .  Warfarin - Pharmacist Dosing Inpatient     Does not apply  q1800     Infusions:    PRN Medications:  acetaminophen, albuterol, benzonatate, calcium carbonate, LORazepam, ondansetron (ZOFRAN) IV, polyethylene glycol, traMADol   Patient Profile   Trevor [name] is a 72 y.o. 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   Admitted 06/22/18 with abdominal pain, nausea, and vomiting.   Assessment/Plan:    1. Abdominal Pain - No obstructive pattern on ABD films. Large stool burden.  - ? Combination of volume, constipation, and doxycycline. Now off doxy and constipation relieved. - Relatively stable now.  2. Chronic systolic CHF: Ischemic cardiomyopathy - EF 20-25% with RV dysfunction on 2/18 echo pre-LVAD. 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 Medtronic ICD. He has had problems with RV failure post-op. He developed worsening renal dysfunction  and finally had to start HD in 8/18. There has been some difficulty getting adequate fluid off him, currently dialyzing 4 days/week. He was recently admitted with volume overload and had HD several days in a row. Per inpatient renal consult, planned to lower dry weight but this does not seem to have been  carried over to his outpatient HD. NYHA III symptoms, stable. He does appear volume overloaded still.  - Volume status OK. Per HD.  - Continue warfarin with INR goal 1.8 - 2.3. No ASA with GI bleeding.  - INR 1.92. Dosing per pharm.   3. CAD: s/p CABG.  - No s/s of ischemia.    - He is on atorvastatin.  4. ESRD: - Ongoing HD 4 days/week, see above. - Nephro following.  - Long discussion this admit. Have contacted outside centers to see if pt is candidate for PD anywhere. If not, will likely have to make transition to [place].  - Have faxed records to [facility] for consideration. [place] has agreed to at least see in outpatient follow up, but worry anatomy is prohibitive. Awaiting word for possible inpatient transfer to [facility] for consideration.  5. Atrial fibrillation: Chronic.  - On warfarin. Pharm D dosing.   - Rate improved with low dose BB.  6. RV failure:  - Continue Revatio at 40 mg TID. No change.  7. HTN:  - MAPs relatively stable. Follow.   8. 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  - Hgb8.7 today. No overt bleeding.   9. GI bleeding: GI AVMs. Now s/p laparoscopic surgical enteroscopy with 4 clips on 6/7/19at [name], c/b ischemic bowel/SBO.  - Hgb 8.7 today. No overt bleeding.  - Tolerating warfarin. Goal INR 1.8-2.3, adjust to keep in this range.  - Getting Aranesp per nephrology.  - Continue octreotide  10. Squamous cell skin cancer: Neck excision site without clear margins. He saw Dr Lisbeth Renshaw with radiation oncology, radiation was recommended. He wants to hold off for the time being, does not think he can handle radiation as HD wears him out 4 days/week. He now has a nodule concerning for skin cancer on his forehead, will refer him back to [name]. No change.  11. H/o TAVR:  - Stable on last echo.  12. Septic Left Knee:s/p I&D and surgical wash out at [facility] -Getting PT - Follow with ortho.  13.  ID:  - Finished doxycycline 100 mg bid x 7 days for driveline redness.   Discussing logistics and planning with [facility]. Will discuss further with MD  I reviewed the LVAD parameters from today, and compared the results to the patient's prior recorded data.  No programming changes were made.  The LVAD is functioning within specified parameters.  The patient performs LVAD self-test daily.  LVAD interrogation was negative for any significant power changes, alarms or PI events/speed drops.  LVAD equipment check completed and is in good working order.  Back-up equipment present.   LVAD education done on emergency procedures and precautions and reviewed exit site care.   Length of Stay: 4  Annamaria Helling 06/26/2018, 7:25 AM  VAD Team --- VAD ISSUES ONLY--- Pager [phone] (7am - 7am)  Advanced Heart Failure Team  Pager [phone] (M-F; 7a - 4p)  Please contact [facility] for night-coverage after hours (4p -7a ) and weekends on [email or web address]  Patient seen and examined with the above-signed Advanced Practice Provider and/or Housestaff. I personally reviewed laboratory  data, imaging studies and relevant notes. I independently examined the patient and formulated the important aspects of the plan. I have edited the note to reflect any of my changes or salient points. I have personally discussed the plan with the patient and/or family.  Tolerating in-house HD fairly well. Volume status looks ok. Says he can tolerate better due to lower volume removal and fact that he can lie in bed during HD. Long discussion with him about the options. Dr. Garner Nash at [facility] willing to evaluate him next week as inpatient to see if he would qualify for PD catheter placement. I spoke with Dr. Kirtland Bouchard on HF/VAD team there and they would accept him in transfer if he was interested. I also reached out to Dr. Jimmy Footman to see if he would be a candidate for trial of home HD - waiting to hear back. Reinforced that  if neither of these were options only next step would likely be hospice. Continue coumadin for now. Discussed dosing with PharmD personally. VAD interrogated personally. Parameters stable.  Glori Bickers, MD  6:45 PM

## 2018-06-26 NOTE — Progress Notes (Signed)
LVAD Coordinator Rounding Note:  HM II LVAD implanted on 12/13/16 by Dr. Darcey Nora under Destination Therapy criteria due to age excluding heart transplantation.  Pt admitted 06/22/18 for nausea and vomiting.   Pt laying in bed reading the newspaper. Pt reports he tolerated HD dialysis "okay" today.   Vital signs: Temp:  98.1 HR: 99 - afib Automatic BP: 105/83 (95) Doppler: 96  O2 Sat: 96 % RA Wt: 78.2>74.8>72.7>72.1kg  LVAD interrogation reveals:  Speed:  9200 Flow:  5.6 Power: 5.7w PI: 5.0 Alarms: none Events: rare PI events  Fixed speed: 9200 Low speed limit: 8600   Drive Line:  Right abd dressing CDI. Next dressing change due 06/29/18. Bedside nurse may change dressing.   Labs:  LDH trend:  251>217>205>203>191>193   INR trend: 1.73>1.96>1.91>1.85>2.15>1.92  Anticoagulation Plan: - INR Goal: 1.8-2.3 - ASA Dose: none due to hx of GI bleed - OP monthly Octreotide for hx of GI bleed  Blood Products:  None this admission  Device: - Medtronic single lead -Therapies: on 231 bpm  Arrythmias: chronic afib  Renal:  - chronic HD on M/W/F/Sat at [facility] on [facility]  Adverse Events on VAD: - 02/2017> GIB- AVM clipped x2 - 07/2017> renal failure- HD started - 11/2017>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 Margins not clear at neck. - 02/23/18> hospitalization for GI bleed. Colon/EGD 02/27/18 with normal EGD. Colonoscopy identified active bleeding in ileum; source not found. Capsule Endoscopy 02/27/18: Showed active bleeding in the ileum. Received 5 units blood with histamine reaction; will give benadryl as OP. - 03/10/18>transferrred to [facility] for double retro balloon scop and sent to IR emolization x 3 with no evidence of bleeding. Received 12 units PCs, remained off anticoagulants. Transferred back to [facility] 03/20/18 Seen by IR 5/20. CTA obtained, but no active bleeding found. Started on octreotide. 03/25/18: Repeat capsule endoscopy completed.  Bleeding appears to come from the mid-ileum. 03/26/18: General surgery reconsulted. Recommended transfer back to [facility] for surgery with intraoperative endoscopy. [name] currently discussing options. Ortho consulted 5/24 for left knee pain. S/p knee aspiration and steroid injection.  Dose of premarin given 5/24.  On 5/25 developed abdominal bloating and distension with n/v. KUB suggestive of ileus versus early SBO. NGT placed for decompression.  Had large bloody BM after this and felt much better, diet now advanced to regular.  - 04/01/18 transferred to [facility] for  laparoscopic surgical enteroscopy for recurrent GI bleed   Plan/Recommendations: 1.  Weekly dressing changes per bedside nurse. Next dressing change due 06/29/18. 2.  Call VAD pager if any questions re: VAD equipment or drive line site issues.   Emerson Monte RN, VAD Coordinator 24/7 VAD pager: [phone]

## 2018-06-27 LAB — CBC
HCT: 29.9 % — ABNORMAL LOW (ref 39.0–52.0)
Hemoglobin: 8.8 g/dL — ABNORMAL LOW (ref 13.0–17.0)
MCH: 28 pg (ref 26.0–34.0)
MCHC: 29.4 g/dL — AB (ref 30.0–36.0)
MCV: 95.2 fL (ref 78.0–100.0)
PLATELETS: 142 10*3/uL — AB (ref 150–400)
RBC: 3.14 MIL/uL — AB (ref 4.22–5.81)
RDW: 17.8 % — AB (ref 11.5–15.5)
WBC: 7.6 10*3/uL (ref 4.0–10.5)

## 2018-06-27 LAB — GLUCOSE, CAPILLARY
GLUCOSE-CAPILLARY: 143 mg/dL — AB (ref 70–99)
Glucose-Capillary: 168 mg/dL — ABNORMAL HIGH (ref 70–99)
Glucose-Capillary: 205 mg/dL — ABNORMAL HIGH (ref 70–99)
Glucose-Capillary: 145 mg/dL — ABNORMAL HIGH (ref 70–99)

## 2018-06-27 LAB — BASIC METABOLIC PANEL
Anion gap: 7 (ref 5–15)
BUN: 19 mg/dL (ref 8–23)
CALCIUM: 8.5 mg/dL — AB (ref 8.9–10.3)
CO2: 29 mmol/L (ref 22–32)
CREATININE: 2.89 mg/dL — AB (ref 0.61–1.24)
Chloride: 99 mmol/L (ref 98–111)
GFR calc Af Amer: 23 mL/min — ABNORMAL LOW (ref >60)
GFR, EST NON AFRICAN AMERICAN: 20 mL/min — AB (ref >60)
Glucose, Bld: 167 mg/dL — ABNORMAL HIGH (ref 70–99)
POTASSIUM: 4.1 mmol/L (ref 3.5–5.1)
SODIUM: 135 mmol/L (ref 135–145)

## 2018-06-27 LAB — LACTATE DEHYDROGENASE: LDH: 205 U/L — AB (ref 98–192)

## 2018-06-27 LAB — PROTIME-INR
INR: 1.64
PROTHROMBIN TIME: 19.3 s — AB (ref 11.4–15.2)

## 2018-06-27 MED ORDER — WARFARIN SODIUM 7.5 MG PO TABS
7.5000 mg | ORAL_TABLET | Freq: Once | ORAL | Status: AC
  Administered 2018-06-27: 7.5 mg via ORAL
  Filled 2018-06-27: qty 1

## 2018-06-27 NOTE — Progress Notes (Signed)
ANTICOAGULATION CONSULT NOTE   Pharmacy Consult for warfarin  Indication: LVAD  No Known Allergies  Patient Measurements: Height: 5\' 5"  (165.1 cm) Weight: 162 lb 14.7 oz (73.9 kg) IBW/kg (Calculated) : 61.5 Heparin Dosing Weight: 75 kg  Vital Signs: Temp: 98.5 F (36.9 C) (08/24 1144) Temp Source: Oral (08/24 1144) BP: 99/79 (08/24 1144) Pulse Rate: 70 (08/24 1144)  Labs: Recent Labs    06/25/18 0248 06/26/18 0219 06/27/18 1133  HGB 9.0* 8.7* 8.8*  HCT 29.9* 29.0* 29.9*  PLT 160 142* 142*  LABPROT 23.9* 21.8* 19.3*  INR 2.15 1.92 1.64  CREATININE 3.02* 3.73* 2.89*    Estimated Creatinine Clearance: 21.7 mL/min (A) (by C-G formula based on SCr of 2.89 mg/dL (H)).   Medical History: Past Medical History:  Diagnosis Date  . AICD (automatic cardioverter/defibrillator) present   . Asthma   . AVM (arteriovenous malformation) of colon 07/07/2017  . CHF (congestive heart failure) ([place])   . Diabetes ([place])   . ESRF (end stage renal failure) ([place]) 07/07/2017   pt receiving hemodialysis Monday- Wednesday-friday, sometimes Saturday  . Kidney disease   . LVAD (left ventricular assist device) present ([place]) 12/2016  . Permanent atrial fibrillation ([place]) 07/07/2017  . Presence of permanent cardiac pacemaker    AICD  . Sleep apnea     Assessment: 68 yoM with LVAD and significant hx of GIB presents with abdominal pain and subtherapeutic INR. Pharmacy consulted to resume warfarin.   INR fell  1.9>1.6 overnight will give boost  - no heparin drip given past bleeding   CBC stable, no bleeding noted per nursing.  Goal of Therapy:  INR goal 1.8-2.3 Monitor platelets by anticoagulation protocol: Yes   Plan:  Warfarin 7.5 mg x1 Daily INR  [name] Pharm.D. CPP, BCPS Clinical Pharmacist [phone] 06/27/2018 3:30 PM

## 2018-06-27 NOTE — Progress Notes (Signed)
[facility] Progress Note  Subjective: on HD now, no new c/o's. Not eating a lot.   Vitals:   06/27/18 0403 06/27/18 0731 06/27/18 0749 06/27/18 0825  BP: 101/76 100/79    Pulse: 81 73 86   Resp: 14 13    Temp: 97.7 F (36.5 C) 98 F (36.7 C)    TempSrc: Oral Oral    SpO2: 100% 95%  91%  Weight: 73.9 kg     Height:        Inpatient medications: . atorvastatin  40 mg Oral q1800  . budesonide (PULMICORT) nebulizer solution  0.25 mg Nebulization BID  . busPIRone  5 mg Oral BID  . calcitRIOL  0.5 mcg Oral Q M,W,F-HD  . carvedilol  3.125 mg Oral BID WC  . Chlorhexidine Gluconate Cloth  6 each Topical Q0600  . docusate sodium  200 mg Oral Daily  . dronabinol  2.5 mg Oral BID AC  . insulin aspart  0-5 Units Subcutaneous QHS  . insulin aspart  0-9 Units Subcutaneous TID WC  . insulin glargine  4 Units Subcutaneous QHS  . insulin glargine  6 Units Subcutaneous Daily  . levothyroxine  25 mcg Oral QAC breakfast  . megestrol  40 mg Oral Daily  . multivitamin  1 tablet Oral QHS  . pantoprazole  40 mg Oral Daily  . senna-docusate  1 tablet Oral BID  . sildenafil  20 mg Oral TID  . traZODone  100 mg Oral QHS  . warfarin  5 mg Oral ONCE-1800  . Warfarin - Pharmacist Dosing Inpatient   Does not apply q1800    acetaminophen, albuterol, benzonatate, calcium carbonate, LORazepam, ondansetron (ZOFRAN) IV, polyethylene glycol, traMADol  Iron/TIBC/Ferritin/ %Sat    Component Value Date/Time   IRON 125 06/14/2017 1605   TIBC 221 (L) 06/14/2017 1605   FERRITIN 622 (H) 06/14/2017 1605   IRONPCTSAT 57 (H) 06/14/2017 1605    Exam: Genalert, not in distress, calm, tired No jvd or bruits Chest clear on L, R base decreased Cor LVAD hum Abd soft ntnd no mass or ascites +bsy Ext trace edema R pretib and 1+ L pretib Neuro is alert, Ox 3 , nf   Home meds: - atorvastatin 40 hs// levothyroxine 25 ug / megestrol 40 / pantoprazole 40 qd  - trazodone 100 mg hs/ tramadol  50 qid prn/ lorazepam 0.5 tid prn/ buspirone 5 bid - sildenafil 20 mg tid - insulin glargine 4u am and 6u pm  - warfarin 5 mg qd  - albuterol prn / flovent prn/  fluticasone 2 puff bid  - other prn's/ vitamins  Dialysis:MWFSat [facility] 4.5h 73.5kg LUA AVF Hep none - mircera 225 last on 8/5  - calc 0.5ug tiw  CXR 8/19 > clear, no acute   Impression: 1  N/V/ abd pain - per primary  2 ESRD - MWFSat HD. No need for 4th dialysis today as not sig vol overloaded. Plan next HD on Monday.  3 ICM/ sp LVAD 4 CAD hx CABG 5 AS sp TAVRS 6 DM2 7  Hx recurrent GIB - stable Hb ~9,  no signs of bleeding 8  Nutrition - doesn't need renal diet, pt request 9  Volume - at dry wt, mild LE edema   Plan - as above   Kelly Splinter MD [facility] pager [phone]   06/27/2018, 11:13 AM   Recent Labs  Lab 06/22/18 0849  06/24/18 0748 06/25/18 0248 06/26/18 0219  NA 135   < >  133* 134* 134*  K 3.9   < > 4.1 3.8 3.7  CL 97*   < > 96* 97* 98  CO2 26   < > 23 29 27   GLUCOSE 326*   < > 118* 208* 171*  BUN 30*   < > 41* 20 28*  CREATININE 2.83*   < > 4.43* 3.02* 3.73*  CALCIUM 8.6*   < > 8.5* 8.2* 8.1*  PHOS  --   --  5.3*  --   --   ALBUMIN 3.1*  --  3.1*  --   --   INR 1.37   < >  --  2.15 1.92   < > = values in this interval not displayed.   Recent Labs  Lab 06/22/18 0849  AST 33  ALT 30  ALKPHOS 87  BILITOT 1.0  PROT 6.4*   Recent Labs  Lab 06/22/18 0849  06/25/18 0248 06/26/18 0219  WBC 10.2   < > 7.7 6.9  NEUTROABS 8.6*  --   --   --   HGB 9.2*   < > 9.0* 8.7*  HCT 30.8*   < > 29.9* 29.0*  MCV 94.5   < > 93.7 93.9  PLT 159   < > 160 142*   < > = values in this interval not displayed.

## 2018-06-27 NOTE — Progress Notes (Signed)
Patient refusing to have labs drawn by phlebotomy.  States that they can be drawn in HD today.

## 2018-06-28 LAB — GLUCOSE, CAPILLARY
GLUCOSE-CAPILLARY: 102 mg/dL — AB (ref 70–99)
GLUCOSE-CAPILLARY: 173 mg/dL — AB (ref 70–99)
Glucose-Capillary: 162 mg/dL — ABNORMAL HIGH (ref 70–99)
Glucose-Capillary: 204 mg/dL — ABNORMAL HIGH (ref 70–99)

## 2018-06-28 LAB — LACTATE DEHYDROGENASE: LDH: 209 U/L — AB (ref 98–192)

## 2018-06-28 LAB — PROTIME-INR
INR: 1.48
Prothrombin Time: 17.8 seconds — ABNORMAL HIGH (ref 11.4–15.2)

## 2018-06-28 LAB — HEPARIN LEVEL (UNFRACTIONATED): HEPARIN UNFRACTIONATED: 0.27 [IU]/mL — AB (ref 0.30–0.70)

## 2018-06-28 MED ORDER — CHLORHEXIDINE GLUCONATE CLOTH 2 % EX PADS: 6.0000 | MEDICATED_PAD | Freq: Every day | CUTANEOUS | Status: DC

## 2018-06-28 MED ORDER — CARVEDILOL 6.25 MG PO TABS
6.2500 mg | ORAL_TABLET | Freq: Two times a day (BID) | ORAL | Status: DC
  Administered 2018-06-28 – 2018-06-30 (×5): 6.25 mg via ORAL
  Filled 2018-06-28 (×5): qty 1

## 2018-06-28 MED ORDER — WARFARIN SODIUM 7.5 MG PO TABS: 7.5000 mg | ORAL_TABLET | Freq: Once | ORAL | Status: DC

## 2018-06-28 MED ORDER — WARFARIN SODIUM 5 MG PO TABS
5.0000 mg | ORAL_TABLET | Freq: Once | ORAL | Status: AC
  Administered 2018-06-28: 5 mg via ORAL
  Filled 2018-06-28: qty 1

## 2018-06-28 MED ORDER — HEPARIN (PORCINE) IN NACL 100-0.45 UNIT/ML-% IJ SOLN
1050.0000 [IU]/h | INTRAMUSCULAR | Status: DC
  Administered 2018-06-28 – 2018-06-29 (×2): 1000 [IU]/h via INTRAVENOUS
  Administered 2018-06-30 – 2018-07-01 (×2): 1050 [IU]/h via INTRAVENOUS
  Filled 2018-06-28 (×5): qty 250

## 2018-06-28 NOTE — Progress Notes (Signed)
[facility] for warfarin / heparin Indication: LVAD  No Known Allergies  Patient Measurements: Height: 5\' 5"  (165.1 cm) Weight: 166 lb 3.2 oz (75.4 kg) IBW/kg (Calculated) : 61.5 Heparin Dosing Weight: 75 kg  Vital Signs: Temp: 98.8 F (37.1 C) (08/25 0741) Temp Source: Oral (08/25 0741) BP: 114/93 (08/25 0741) Pulse Rate: 87 (08/25 0741)  Labs: Recent Labs    06/26/18 0219 06/27/18 1133 06/28/18 0308  HGB 8.7* 8.8*  --   HCT 29.0* 29.9*  --   PLT 142* 142*  --   LABPROT 21.8* 19.3* 17.8*  INR 1.92 1.64 1.48  CREATININE 3.73* 2.89*  --     Estimated Creatinine Clearance: 21.9 mL/min (A) (by C-G formula based on SCr of 2.89 mg/dL (H)).   Medical History: Past Medical History:  Diagnosis Date  . AICD (automatic cardioverter/defibrillator) present   . Asthma   . AVM (arteriovenous malformation) of colon 07/07/2017  . CHF (congestive heart failure) ([place])   . Diabetes ([place])   . ESRF (end stage renal failure) ([place]) 07/07/2017   pt receiving hemodialysis Monday- Wednesday-friday, sometimes Saturday  . Kidney disease   . LVAD (left ventricular assist device) present ([place]) 12/2016  . Permanent atrial fibrillation ([place]) 07/07/2017  . Presence of permanent cardiac pacemaker    AICD  . Sleep apnea     Assessment: 63 yoM with LVAD and significant hx of GIB presents with abdominal pain and subtherapeutic INR. Pharmacy consulted to resume warfarin.   INR fell  1.9>1.6>1.48 despite boost dose last pm May be a good thing - as may go to [facility] for PD cath placed - will continue warfarin at home dose 5mg  and run INR lower end range with heparin drip at low rate to balance bleed risk and clot risk LDH 200s, h/h stable, no bleeding.      Goal of Therapy:  INR goal 1.8-2.3 HL about 0.2 Monitor platelets by anticoagulation protocol: Yes   Plan:  Warfarin 5 mg x1 Daily INR Heparin drip 1000 uts/hr  HL in 6hr and daily   [name]  Pharm.D. CPP, BCPS Clinical Pharmacist [phone] 06/28/2018 11:15 AM

## 2018-06-28 NOTE — Progress Notes (Signed)
Advanced Heart Failure VAD Team Note  PCP-Cardiologist: No primary care provider on file.   Subjective:    Tolerating HD. Feels weak. Poor appetite. Otherwise ok   LVAD Interrogation HM 3: Speed: 9200 Flow: 5.0 PI: 7.2 Power: 5.0  Objective:    Vital Signs:   Temp:  [97.4 F (36.3 C)-98.8 F (37.1 C)] 98.8 F (37.1 C) (08/25 0741) Pulse Rate:  [70-118] 87 (08/25 0741) Resp:  [13-28] 17 (08/25 0741) BP: (96-114)/(78-99) 114/93 (08/25 0741) SpO2:  [97 %-100 %] 99 % (08/25 0838) Weight:  [75.4 kg] 75.4 kg (08/25 0514) Last BM Date: 06/27/18 Mean arterial Pressure 80-90s  Intake/Output:   Intake/Output Summary (Last 24 hours) at 06/28/2018 1107 Last data filed at 06/28/2018 0900 Gross per 24 hour  Intake 840 ml  Output 225 ml  Net 615 ml     Physical Exam    General:  NAD.  Weak appearing. NAD HEENT: normal  Neck: supple. JVP not elevated.  Carotids 2+ bilat; no bruits. No lymphadenopathy or thryomegaly appreciated. Cor: LVAD hum.  Lungs: Clear. Abdomen: obese soft, nontender, non-distended. No hepatosplenomegaly. No bruits or masses. Good bowel sounds. Driveline site clean. Anchor in place.  Extremities: no cyanosis, clubbing, rash. Tr edema  RUE AVF Neuro: alert & oriented x 3. No focal deficits. Moves all 4 without problem    Telemetry   Afib 70-80s, personally reviewed.   EKG    No new tracings.   Labs   Basic Metabolic Panel: Recent Labs  Lab 06/23/18 0526 06/24/18 0748 06/24/18 1823 06/25/18 0248 06/26/18 0219 06/27/18 1133  NA 131* 133*  --  134* 134* 135  K 3.8 4.1  --  3.8 3.7 4.1  CL 97* 96*  --  97* 98 99  CO2 24 23  --  29 27 29   GLUCOSE 200* 118*  --  208* 171* 167*  BUN 34* 41*  --  20 28* 19  CREATININE 3.91* 4.43*  --  3.02* 3.73* 2.89*  CALCIUM 8.3* 8.5*  --  8.2* 8.1* 8.5*  MG  --   --  1.8 1.7  --   --   PHOS  --  5.3*  --   --   --   --     Liver Function Tests: Recent Labs  Lab 06/22/18 0849 06/24/18 0748  AST 33   --   ALT 30  --   ALKPHOS 87  --   BILITOT 1.0  --   PROT 6.4*  --   ALBUMIN 3.1* 3.1*   No results for input(s): LIPASE, AMYLASE in the last 168 hours. No results for input(s): AMMONIA in the last 168 hours.  CBC: Recent Labs  Lab 06/22/18 0849 06/23/18 0526 06/24/18 0747 06/25/18 0248 06/26/18 0219 06/27/18 1133  WBC 10.2 11.4* 9.9 7.7 6.9 7.6  NEUTROABS 8.6*  --   --   --   --   --   HGB 9.2* 8.9* 9.4* 9.0* 8.7* 8.8*  HCT 30.8* 29.5* 30.7* 29.9* 29.0* 29.9*  MCV 94.5 93.4 92.7 93.7 93.9 95.2  PLT 159 156 170 160 142* 142*    INR: Recent Labs  Lab 06/24/18 0747 06/25/18 0248 06/26/18 0219 06/27/18 1133 06/28/18 0308  INR 1.82 2.15 1.92 1.64 1.48    Imaging   No results found.   Medications:     Scheduled Medications: . atorvastatin  40 mg Oral q1800  . budesonide (PULMICORT) nebulizer solution  0.25 mg Nebulization BID  . busPIRone  5  mg Oral BID  . calcitRIOL  0.5 mcg Oral Q M,W,F-HD  . carvedilol  3.125 mg Oral BID WC  . Chlorhexidine Gluconate Cloth  6 each Topical Q0600  . docusate sodium  200 mg Oral Daily  . dronabinol  2.5 mg Oral BID AC  . insulin aspart  0-5 Units Subcutaneous QHS  . insulin aspart  0-9 Units Subcutaneous TID WC  . insulin glargine  4 Units Subcutaneous QHS  . insulin glargine  6 Units Subcutaneous Daily  . levothyroxine  25 mcg Oral QAC breakfast  . megestrol  40 mg Oral Daily  . multivitamin  1 tablet Oral QHS  . pantoprazole  40 mg Oral Daily  . senna-docusate  1 tablet Oral BID  . sildenafil  20 mg Oral TID  . traZODone  100 mg Oral QHS  . Warfarin - Pharmacist Dosing Inpatient   Does not apply q1800    Infusions:   PRN Medications: acetaminophen, albuterol, benzonatate, calcium carbonate, LORazepam, ondansetron (ZOFRAN) IV, polyethylene glycol, traMADol   Patient Profile   Trevor [name] is a 72 y.o. 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   Admitted 06/22/18 with abdominal pain, nausea, and vomiting.   Assessment/Plan:    1. Abdominal Pain/n/v - Related to intolerance of outpatient HD.  - Non-obstructive pattern on ABD films. Large stool burden. Improved with enemas 2. Chronic systolic CHF: Ischemic cardiomyopathy - EF 20-25% with RV dysfunction on 2/18 echo pre-LVAD. 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 Medtronic ICD. He has had problems with RV failure post-op. He developed worsening renal dysfunction and finally had to start HD in 8/18. There has been some difficulty getting adequate fluid off him, currently dialyzing 4 days/week.  - Volume status improved with HD - Continue warfarin with INR goal 1.8 - 2.3. No ASA with GI bleeding.  -  INR 1.64. Continue coumadin. Will need to hold coumadin on Monday if going to [facility] for evaluation for PD cath. Discussed dosing with PharmD personally. 3. CAD: s/p CABG.  - No s/s of ischemia.    - He is on atorvastatin.  4. ESRD: - Ongoing HD 4 days/week, see above. - Nephro following.  -Tolerating in-house HD fairly well. Volume status looks ok. Says he can tolerate better due to lower volume removal and fact that he can lie in bed during HD. Long discussion with him about the options. Dr. Garner Nash at [facility] willing to evaluate him next week as inpatient to see if he would qualify for PD catheter placement. I spoke with Dr. Kirtland Bouchard on HF/VAD team there and they would accept him in transfer if he was interested. I also reached out to Dr. Jimmy Footman to see if he would be a candidate for trial of home HD - waiting to hear back. Reinforced that if neither of these were options only next step would likely be hospice. I spoke to Dr. Jonnie Finner today and he feels as if he may be  candidate for home HD with family support. 5. Atrial fibrillation: Chronic.  - On warfarin. Pharm D dosing.   - Rate controlled with low dose BB.  6. RV failure:  - Continue Revatio at 40 mg TID. No  change.  7. HTN:  - MAPs stable 80-90. Follow.   8. 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  - Hgb8.8 today. No overt bleeding.   9. GI bleeding: GI AVMs. Now s/p laparoscopic surgical enteroscopy with 4 clips on 6/7/19at [name], c/b ischemic bowel/SBO.  - Hgb 8.8 today. No overt bleeding.  - Tolerating warfarin. Goal INR 1.8-2.3, adjust to keep in this range.  - Getting Aranesp per nephrology.  - Continue octreotide  10. Squamous cell skin cancer: Neck excision site without clear margins. He saw Dr Lisbeth Renshaw with radiation oncology, radiation was recommended. He wants to hold off for the time being, does not think he can handle radiation as HD wears him out 4 days/week. He now has a nodule concerning for skin cancer on his forehead, will refer him back to [name]. No change.  11. H/o TAVR:  - Stable on last echo.  12. Septic Left Knee:s/p I&D and surgical wash out at [facility] -Getting PT - Follow with ortho.   I reviewed the LVAD parameters from today, and compared the results to the patient's prior recorded data.  No programming changes were made.  The LVAD is functioning within specified parameters.  The patient performs LVAD self-test daily.  LVAD interrogation was negative for any significant power changes, alarms or PI events/speed drops.  LVAD equipment check completed and is in good working order.  Back-up equipment present.   LVAD education done on emergency procedures and precautions and reviewed exit site care.   Length of Stay: 6  Glori Bickers, MD  11:07 AM   VAD Team --- VAD ISSUES ONLY--- Pager [phone] (7am - 7am)  Advanced Heart Failure Team  Pager [phone] (M-F; 7a - 4p)  Please contact [facility] for night-coverage after hours (4p -7a ) and weekends on [email or web address]

## 2018-06-28 NOTE — Progress Notes (Addendum)
Advanced Heart Failure VAD Team Note  PCP-Cardiologist: No primary care provider on file.   Subjective:    Had HD yesterday. Tolerated it well. No HD today. Weight up 4 pounds. Breathing better. No orthopnea or PND. No nausea.   LVAD Interrogation HM 3: Speed: 9200 Flow: 4.0 PI: 7.1 Power: 5.0 VAD interrogated personally. Parameters stable.   Objective:    Vital Signs:   Temp:  [97.4 F (36.3 C)-98.8 F (37.1 C)] 98.8 F (37.1 C) (08/25 0741) Pulse Rate:  [70-118] 87 (08/25 0741) Resp:  [13-28] 17 (08/25 0741) BP: (96-114)/(78-99) 114/93 (08/25 0741) SpO2:  [97 %-100 %] 99 % (08/25 0838) Weight:  [75.4 kg] 75.4 kg (08/25 0514) Last BM Date: 06/27/18 Mean arterial Pressure 90-106  Intake/Output:   Intake/Output Summary (Last 24 hours) at 06/28/2018 1110 Last data filed at 06/28/2018 0900 Gross per 24 hour  Intake 840 ml  Output 225 ml  Net 615 ml     Physical Exam    General:  Lying in bed NAD.  HEENT: normal  Neck: supple. JVP 9-10.  Carotids 2+ bilat; no bruits. No lymphadenopathy or thryomegaly appreciated. Cor: LVAD hum.  Lungs: Clear. Abdomen: obese soft, nontender, non-distended. No hepatosplenomegaly. No bruits or masses. Good bowel sounds. Driveline site clean. Anchor in place.  Extremities: no cyanosis, clubbing, rash. Warm 1-2+ edema  +RUE AVF Neuro: alert & oriented x 3. No focal deficits. Moves all 4 without problem    Telemetry   Afib 80s, personally reviewed.   EKG    No new tracings.   Labs   Basic Metabolic Panel: Recent Labs  Lab 06/23/18 0526 06/24/18 0748 06/24/18 1823 06/25/18 0248 06/26/18 0219 06/27/18 1133  NA 131* 133*  --  134* 134* 135  K 3.8 4.1  --  3.8 3.7 4.1  CL 97* 96*  --  97* 98 99  CO2 24 23  --  29 27 29   GLUCOSE 200* 118*  --  208* 171* 167*  BUN 34* 41*  --  20 28* 19  CREATININE 3.91* 4.43*  --  3.02* 3.73* 2.89*  CALCIUM 8.3* 8.5*  --  8.2* 8.1* 8.5*  MG  --   --  1.8 1.7  --   --   PHOS  --  5.3*  --    --   --   --     Liver Function Tests: Recent Labs  Lab 06/22/18 0849 06/24/18 0748  AST 33  --   ALT 30  --   ALKPHOS 87  --   BILITOT 1.0  --   PROT 6.4*  --   ALBUMIN 3.1* 3.1*   No results for input(s): LIPASE, AMYLASE in the last 168 hours. No results for input(s): AMMONIA in the last 168 hours.  CBC: Recent Labs  Lab 06/22/18 0849 06/23/18 0526 06/24/18 0747 06/25/18 0248 06/26/18 0219 06/27/18 1133  WBC 10.2 11.4* 9.9 7.7 6.9 7.6  NEUTROABS 8.6*  --   --   --   --   --   HGB 9.2* 8.9* 9.4* 9.0* 8.7* 8.8*  HCT 30.8* 29.5* 30.7* 29.9* 29.0* 29.9*  MCV 94.5 93.4 92.7 93.7 93.9 95.2  PLT 159 156 170 160 142* 142*    INR: Recent Labs  Lab 06/24/18 0747 06/25/18 0248 06/26/18 0219 06/27/18 1133 06/28/18 0308  INR 1.82 2.15 1.92 1.64 1.48    Imaging   No results found.   Medications:     Scheduled Medications: . atorvastatin  40  mg Oral q1800  . budesonide (PULMICORT) nebulizer solution  0.25 mg Nebulization BID  . busPIRone  5 mg Oral BID  . calcitRIOL  0.5 mcg Oral Q M,W,F-HD  . carvedilol  3.125 mg Oral BID WC  . Chlorhexidine Gluconate Cloth  6 each Topical Q0600  . docusate sodium  200 mg Oral Daily  . dronabinol  2.5 mg Oral BID AC  . insulin aspart  0-5 Units Subcutaneous QHS  . insulin aspart  0-9 Units Subcutaneous TID WC  . insulin glargine  4 Units Subcutaneous QHS  . insulin glargine  6 Units Subcutaneous Daily  . levothyroxine  25 mcg Oral QAC breakfast  . megestrol  40 mg Oral Daily  . multivitamin  1 tablet Oral QHS  . pantoprazole  40 mg Oral Daily  . senna-docusate  1 tablet Oral BID  . sildenafil  20 mg Oral TID  . traZODone  100 mg Oral QHS  . warfarin  7.5 mg Oral ONCE-1800  . Warfarin - Pharmacist Dosing Inpatient   Does not apply q1800    Infusions: . heparin      PRN Medications: acetaminophen, albuterol, benzonatate, calcium carbonate, LORazepam, ondansetron (ZOFRAN) IV, polyethylene glycol,  traMADol   Patient Profile   Trevor [name] is a 72 y.o. 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   Admitted 06/22/18 with abdominal pain, nausea, and vomiting.   Assessment/Plan:    1. Abdominal Pain/n/v - Now resolved. Related to intolerance of outpatient HD.  - Non-obstructive pattern on KUB. Large stool burden. Improved with enemas 2. Chronic systolic CHF: Ischemic cardiomyopathy - EF 20-25% with RV dysfunction on 2/18 echo pre-LVAD. 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 Medtronic ICD. He has had problems with RV failure post-op. He developed worsening renal dysfunction and finally had to start HD in 8/18. There has been some difficulty getting adequate fluid off him, currently dialyzing 4 days/week.  - Volume status improved with HD. Mildly elevated today. Plan repeat HD tomorrow.  - Continue warfarin with INR goal 1.8 - 2.3. No ASA with GI bleeding.  -  INR 1.48. Continue low-dose coumadin. Will need to hold coumadin on Monday if going to [facility] for evaluation for PD cath. Will start low-dose heparin today. Discussed dosing with PharmD personally. 3. CAD: s/p CABG.  - No s/s of ischemia.    - He is on atorvastatin.  4. ESRD: - Ongoing HD 4 days/week, see above. - Nephro following.  -Tolerating in-house HD fairly well. Volume status slightly elevated. Says he can tolerate better due to lower volume removal and fact that he can lie in bed during HD.  - Long discussion with him about the options. Dr. Garner Nash at [facility] willing to evaluate him next week as inpatient to see if he would qualify for PD catheter placement. I spoke with Dr. Kirtland Bouchard on HF/VAD team there and they would accept him in  transfer if he was interested. We will discuss with them again tomorrow. They agreed that a trial of home HD first might be a better option. - I also reached out to Dr. Jimmy Footman to see if he would be a candidate for trial of home HD - waiting to  hear back. I've talked with several of Dr. Deterding's colleagues over the weekend who feel that a trial of home HD might be a realistic option if family support in place and no other absolute barriers. We will need definitive input from Renal on this soon as the only other option here is Hospice.  5. Atrial fibrillation: Chronic.  - On warfarin. Pharm D dosing.  See discussion above. Start heparin - Rate controlled with low dose BB.  6. RV failure:  - Continue Revatio at 40 mg TID. No change.  7. HTN:  - MAPs elevated. Will increase carvedilol. Can add amlodipine or hydralazine as needed.  8. 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  - Hgb8.8 yesterday. No overt bleeding.   9. GI bleeding: GI AVMs. Now s/p laparoscopic surgical enteroscopy with 4 clips on 6/7/19at [name], c/b ischemic bowel/SBO.  - Hgb 8.8 yesterday. No overt bleeding.  - Tolerating warfarin. INR low. Start heparin. See discussion above. Goal INR 1.8-2.3, adjust to keep in this range.  - Getting Aranesp per nephrology.  - Continue octreotide  10. Squamous cell skin cancer: Neck excision site without clear margins. He saw Dr Lisbeth Renshaw with radiation oncology, radiation was recommended. He wants to hold off for the time being, does not think he can handle radiation as HD wears him out 4 days/week. He now has a nodule concerning for skin cancer on his forehead, will refer him back to [name]. No change.  11. H/o TAVR:  - Stable on last echo.  12. Septic Left Knee:s/p I&D and surgical wash out at [facility] -Getting PT - Follow with ortho.   I reviewed the LVAD parameters from today, and compared the results to the patient's  prior recorded data.  No programming changes were made.  The LVAD is functioning within specified parameters.  The patient performs LVAD self-test daily.  LVAD interrogation was negative for any significant power changes, alarms or PI events/speed drops.  LVAD equipment check completed and is in good working order.  Back-up equipment present.   LVAD education done on emergency procedures and precautions and reviewed exit site care.   Length of Stay: 6  Glori Bickers, MD  11:10 AM   VAD Team --- VAD ISSUES ONLY--- Pager [phone] (7am - 7am)  Advanced Heart Failure Team  Pager [phone] (M-F; 7a - 4p)  Please contact [facility] for night-coverage after hours (4p -7a ) and weekends on [email or web address]  Addendum:  Spoke with Dr. Jimmy Footman today who felt he might be candidate for trial of home HD if he has point person at home. I asked Mr. Lemmerman to talk with his family and appoint a point person. I have asked Dr. Jimmy Footman to outline next steps for [place] to make this transition.   Glori Bickers, MD  11:49 AM

## 2018-06-28 NOTE — Progress Notes (Signed)
House for warfarin / heparin Indication: LVAD  No Known Allergies  Patient Measurements: Height: 5\' 5"  (165.1 cm) Weight: 166 lb 3.2 oz (75.4 kg) IBW/kg (Calculated) : 61.5 Heparin Dosing Weight: 75 kg  Vital Signs: Temp: 99.2 F (37.3 C) (08/25 1915) Temp Source: Oral (08/25 1915) BP: 92/65 (08/25 1915) Pulse Rate: 71 (08/25 1915)  Labs: Recent Labs    06/26/18 0219 06/27/18 1133 06/28/18 0308 06/28/18 1657  HGB 8.7* 8.8*  --   --   HCT 29.0* 29.9*  --   --   PLT 142* 142*  --   --   LABPROT 21.8* 19.3* 17.8*  --   INR 1.92 1.64 1.48  --   HEPARINUNFRC  --   --   --  0.27*  CREATININE 3.73* 2.89*  --   --     Estimated Creatinine Clearance: 21.9 mL/min (A) (by C-G formula based on SCr of 2.89 mg/dL (H)).   Medical History: Past Medical History:  Diagnosis Date  . AICD (automatic cardioverter/defibrillator) present   . Asthma   . AVM (arteriovenous malformation) of colon 07/07/2017  . CHF (congestive heart failure) ([place])   . Diabetes ([place])   . ESRF (end stage renal failure) ([place]) 07/07/2017   pt receiving hemodialysis Monday- Wednesday-friday, sometimes Saturday  . Kidney disease   . LVAD (left ventricular assist device) present ([place]) 12/2016  . Permanent atrial fibrillation ([place]) 07/07/2017  . Presence of permanent cardiac pacemaker    AICD  . Sleep apnea     Assessment: 53 yoM with LVAD and significant hx of GIB presents with abdominal pain and subtherapeutic INR. Pharmacy consulted to resume warfarin. INR fell 1.9>1.6>1.48 despite boosted dose on 8/24. May be ok as patient may go to [facility] for PD cath placement. Will continue warfarin at home dose 5mg  and aim for lower end of INR goal with heparin drip at low rate to balance bleeding vs.clotting risk.  Initial heparin level near goal of 0.2 at 0.27 this PM.  Goal of Therapy:  INR 1.8-2.3 HL ~0.2 Monitor platelets by anticoagulation protocol: Yes   Plan:   Received warfarin 5mg  tonight Continue heparin gtt at 1000 units/hr Recheck heparin level with AM labs Daily INR, heparin level, and CBC Monitor s/sx of bleeding  Sharunda Salmon N. Gerarda Fraction, PharmD PGY2 Infectious Diseases Pharmacy Resident Phone: [phone] 06/28/2018 8:11 PM

## 2018-06-28 NOTE — Progress Notes (Signed)
[facility] Progress Note  Subjective: on HD now, no new c/o's. Not eating a lot.   Vitals:   06/27/18 2313 06/28/18 0514 06/28/18 0741 06/28/18 0838  BP: (!) 110/93 (!) 113/99 (!) 114/93   Pulse:   87   Resp: 18 13 17    Temp: 98.2 F (36.8 C) (!) 97.4 F (36.3 C) 98.8 F (37.1 C)   TempSrc: Axillary Oral Oral   SpO2: 98% 100% 99% 99%  Weight:  75.4 kg    Height:        Inpatient medications: . atorvastatin  40 mg Oral q1800  . budesonide (PULMICORT) nebulizer solution  0.25 mg Nebulization BID  . busPIRone  5 mg Oral BID  . calcitRIOL  0.5 mcg Oral Q M,W,F-HD  . carvedilol  6.25 mg Oral BID WC  . Chlorhexidine Gluconate Cloth  6 each Topical Q0600  . docusate sodium  200 mg Oral Daily  . dronabinol  2.5 mg Oral BID AC  . insulin aspart  0-5 Units Subcutaneous QHS  . insulin aspart  0-9 Units Subcutaneous TID WC  . insulin glargine  4 Units Subcutaneous QHS  . insulin glargine  6 Units Subcutaneous Daily  . levothyroxine  25 mcg Oral QAC breakfast  . megestrol  40 mg Oral Daily  . multivitamin  1 tablet Oral QHS  . pantoprazole  40 mg Oral Daily  . senna-docusate  1 tablet Oral BID  . sildenafil  20 mg Oral TID  . traZODone  100 mg Oral QHS  . warfarin  5 mg Oral ONCE-1800  . Warfarin - Pharmacist Dosing Inpatient   Does not apply q1800   . heparin 1,000 Units/hr (06/28/18 1130)   acetaminophen, albuterol, benzonatate, calcium carbonate, LORazepam, ondansetron (ZOFRAN) IV, polyethylene glycol, traMADol  Iron/TIBC/Ferritin/ %Sat    Component Value Date/Time   IRON 125 06/14/2017 1605   TIBC 221 (L) 06/14/2017 1605   FERRITIN 622 (H) 06/14/2017 1605   IRONPCTSAT 57 (H) 06/14/2017 1605    Exam: Genalert, not in distress, calm, tired No jvd or bruits Chest clear on L, R base decreased Cor LVAD hum Abd soft ntnd no mass or ascites +bsy Ext trace edema R pretib and 1+ L pretib Neuro is alert, Ox 3 , nf   Home meds: - atorvastatin 40  hs// levothyroxine 25 ug / megestrol 40 / pantoprazole 40 qd  - trazodone 100 mg hs/ tramadol 50 qid prn/ lorazepam 0.5 tid prn/ buspirone 5 bid - sildenafil 20 mg tid - insulin glargine 4u am and 6u pm  - warfarin 5 mg qd  - albuterol prn / flovent prn/  fluticasone 2 puff bid  - other prn's/ vitamins  Dialysis:MWFSat [facility] 4.5h 73.5kg LUA AVF Hep none - mircera 225 last on 8/5  - calc 0.5ug tiw  CXR 8/19 > clear, no acute   Impression: 1  N/V/ abd pain - per primary  2 ESRD - MWFSat HD. HD tomorrow. Option of home hemodialysis raised, spoke w/ patient, he doesn't know much about it.  We will check w/ his primary nephrologist during the week about this.   3 ICM/ sp LVAD 4 CAD hx CABG 5 AS sp TAVRS 6 DM2 7  Hx recurrent GIB - stable Hb ~9,  no signs of bleeding 8  Nutrition - doesn't need renal diet, pt request 9  Volume - up 2kg today, UF 2.5 w/ HD tomorrow.    Plan - as above   Kelly Splinter MD [facility] pager [phone]   06/28/2018, 12:00 PM   Recent Labs  Lab 06/22/18 0849  06/24/18 0748  06/26/18 0219 06/27/18 1133 06/28/18 0308  NA 135   < > 133*   < > 134* 135  --   K 3.9   < > 4.1   < > 3.7 4.1  --   CL 97*   < > 96*   < > 98 99  --   CO2 26   < > 23   < > 27 29  --   GLUCOSE 326*   < > 118*   < > 171* 167*  --   BUN 30*   < > 41*   < > 28* 19  --   CREATININE 2.83*   < > 4.43*   < > 3.73* 2.89*  --   CALCIUM 8.6*   < > 8.5*   < > 8.1* 8.5*  --   PHOS  --   --  5.3*  --   --   --   --   ALBUMIN 3.1*  --  3.1*  --   --   --   --   INR 1.37   < >  --    < > 1.92 1.64 1.48   < > = values in this interval not displayed.   Recent Labs  Lab 06/22/18 0849  AST 33  ALT 30  ALKPHOS 87  BILITOT 1.0  PROT 6.4*   Recent Labs  Lab 06/22/18 0849  06/26/18 0219 06/27/18 1133  WBC 10.2   < > 6.9 7.6  NEUTROABS 8.6*  --   --   --   HGB 9.2*   < > 8.7* 8.8*  HCT 30.8*   < > 29.0* 29.9*  MCV 94.5   < > 93.9 95.2  PLT  159   < > 142* 142*   < > = values in this interval not displayed.

## 2018-06-29 DIAGNOSIS — I482 Chronic atrial fibrillation: Secondary | ICD-10-CM

## 2018-06-29 LAB — RENAL FUNCTION PANEL
ALBUMIN: 3.1 g/dL — AB (ref 3.5–5.0)
ANION GAP: 8 (ref 5–15)
BUN: 36 mg/dL — ABNORMAL HIGH (ref 8–23)
CHLORIDE: 101 mmol/L (ref 98–111)
CO2: 25 mmol/L (ref 22–32)
Calcium: 8.5 mg/dL — ABNORMAL LOW (ref 8.9–10.3)
Creatinine, Ser: 3.84 mg/dL — ABNORMAL HIGH (ref 0.61–1.24)
GFR calc Af Amer: 17 mL/min — ABNORMAL LOW (ref >60)
GFR calc non Af Amer: 14 mL/min — ABNORMAL LOW (ref >60)
GLUCOSE: 134 mg/dL — AB (ref 70–99)
POTASSIUM: 4.2 mmol/L (ref 3.5–5.1)
Phosphorus: 3.5 mg/dL (ref 2.5–4.6)
Sodium: 134 mmol/L — ABNORMAL LOW (ref 135–145)

## 2018-06-29 LAB — CBC
HEMATOCRIT: 29.6 % — AB (ref 39.0–52.0)
HEMOGLOBIN: 8.9 g/dL — AB (ref 13.0–17.0)
MCH: 28.6 pg (ref 26.0–34.0)
MCHC: 30.1 g/dL (ref 30.0–36.0)
MCV: 95.2 fL (ref 78.0–100.0)
Platelets: 136 10*3/uL — ABNORMAL LOW (ref 150–400)
RBC: 3.11 MIL/uL — ABNORMAL LOW (ref 4.22–5.81)
RDW: 17.9 % — ABNORMAL HIGH (ref 11.5–15.5)
WBC: 7 10*3/uL (ref 4.0–10.5)

## 2018-06-29 LAB — LACTATE DEHYDROGENASE: LDH: 194 U/L — ABNORMAL HIGH (ref 98–192)

## 2018-06-29 LAB — GLUCOSE, CAPILLARY
GLUCOSE-CAPILLARY: 139 mg/dL — AB (ref 70–99)
GLUCOSE-CAPILLARY: 199 mg/dL — AB (ref 70–99)
GLUCOSE-CAPILLARY: 240 mg/dL — AB (ref 70–99)
Glucose-Capillary: 136 mg/dL — ABNORMAL HIGH (ref 70–99)

## 2018-06-29 LAB — PROTIME-INR
INR: 1.46
Prothrombin Time: 17.6 seconds — ABNORMAL HIGH (ref 11.4–15.2)

## 2018-06-29 LAB — HEPARIN LEVEL (UNFRACTIONATED)
Heparin Unfractionated: 0.15 IU/mL — ABNORMAL LOW (ref 0.30–0.70)
Heparin Unfractionated: 0.25 IU/mL — ABNORMAL LOW (ref 0.30–0.70)

## 2018-06-29 MED ORDER — WARFARIN SODIUM 7.5 MG PO TABS
7.5000 mg | ORAL_TABLET | Freq: Once | ORAL | Status: AC
  Administered 2018-06-29: 7.5 mg via ORAL
  Filled 2018-06-29 (×2): qty 1

## 2018-06-29 NOTE — Progress Notes (Signed)
[facility] Progress Note  Subjective: on HD now, no new c/o's. Would like more info about home hemo.   Vitals:   06/29/18 0740 06/29/18 0800 06/29/18 0830 06/29/18 0900  BP: 115/84 121/85 126/82 118/64  Pulse: 72 66 86 80  Resp:      Temp:      TempSrc:      SpO2:      Weight:      Height:        Inpatient medications: . atorvastatin  40 mg Oral q1800  . budesonide (PULMICORT) nebulizer solution  0.25 mg Nebulization BID  . busPIRone  5 mg Oral BID  . calcitRIOL  0.5 mcg Oral Q M,W,F-HD  . carvedilol  6.25 mg Oral BID WC  . Chlorhexidine Gluconate Cloth  6 each Topical Q0600  . docusate sodium  200 mg Oral Daily  . dronabinol  2.5 mg Oral BID AC  . insulin aspart  0-5 Units Subcutaneous QHS  . insulin aspart  0-9 Units Subcutaneous TID WC  . insulin glargine  4 Units Subcutaneous QHS  . insulin glargine  6 Units Subcutaneous Daily  . levothyroxine  25 mcg Oral QAC breakfast  . megestrol  40 mg Oral Daily  . multivitamin  1 tablet Oral QHS  . pantoprazole  40 mg Oral Daily  . senna-docusate  1 tablet Oral BID  . sildenafil  20 mg Oral TID  . traZODone  100 mg Oral QHS  . Warfarin - Pharmacist Dosing Inpatient   Does not apply q1800   . heparin 1,050 Units/hr (06/29/18 0844)   acetaminophen, albuterol, benzonatate, calcium carbonate, LORazepam, ondansetron (ZOFRAN) IV, polyethylene glycol, traMADol  Iron/TIBC/Ferritin/ %Sat    Component Value Date/Time   IRON 125 06/14/2017 1605   TIBC 221 (L) 06/14/2017 1605   FERRITIN 622 (H) 06/14/2017 1605   IRONPCTSAT 57 (H) 06/14/2017 1605    Exam: Genalert, not in distress, calm No jvd or bruits Chest clear ant, normal WOB Cor LVAD hum Abd soft ntnd  Ext trace edema Neuro is alert, Ox 3 , nf   Home meds: - atorvastatin 40 hs// levothyroxine 25 ug / megestrol 40 / pantoprazole 40 qd  - trazodone 100 mg hs/ tramadol 50 qid prn/ lorazepam 0.5 tid prn/ buspirone 5 bid - sildenafil 20 mg tid -  insulin glargine 4u am and 6u pm  - warfarin 5 mg qd  - albuterol prn / flovent prn/  fluticasone 2 puff bid  - other prn's/ vitamins  Dialysis:MWFSat [facility] 4.5h 73.5kg LUA AVF Hep none - mircera 225 last on 8/5  - calc 0.5ug tiw  CXR 8/19 > clear, no acute   Impression: 1  N/V/ abd pain - resolved 2 ESRD - MWFSat HD. HD today. Exploring option of home hemo -- I spoke with home therapies unit at [facility].  Next step will be providing he and his sister with education re: home hemo.  I am looking for a DVD as they need this level of detail if they plan to proceed. If they are interested will pursue further. If not will need to decide PD attempt versus hospice.  3 ICM/ sp LVAD 4 CAD hx CABG 5 AS sp TAVRS 6 DM2 7  Hx recurrent GIB - stable Hb 8.9,  no signs of bleeding 8  Nutrition - doesn't need renal diet, pt request 9  Volume - UFB 2.5L today   Plan - as above   Jannifer Hick MD [facility] pager  [phone]   06/29/2018, 9:11 AM   Recent Labs  Lab 06/24/18 0748  06/27/18 1133 06/28/18 0308 06/29/18 0730 06/29/18 0757  NA 133*   < > 135  --  134*  --   K 4.1   < > 4.1  --  4.2  --   CL 96*   < > 99  --  101  --   CO2 23   < > 29  --  25  --   GLUCOSE 118*   < > 167*  --  134*  --   BUN 41*   < > 19  --  36*  --   CREATININE 4.43*   < > 2.89*  --  3.84*  --   CALCIUM 8.5*   < > 8.5*  --  8.5*  --   PHOS 5.3*  --   --   --  3.5  --   ALBUMIN 3.1*  --   --   --  3.1*  --   INR  --    < > 1.64 1.48  --  1.46   < > = values in this interval not displayed.   No results for input(s): AST, ALT, ALKPHOS, BILITOT, PROT in the last 168 hours. Recent Labs  Lab 06/27/18 1133 06/29/18 0730  WBC 7.6 7.0  HGB 8.8* 8.9*  HCT 29.9* 29.6*  MCV 95.2 95.2  PLT 142* 136*

## 2018-06-29 NOTE — Progress Notes (Signed)
[place] for warfarin / heparin Indication: LVAD  No Known Allergies  Patient Measurements: Height: 5\' 5"  (165.1 cm) Weight: 170 lb 3.1 oz (77.2 kg) IBW/kg (Calculated) : 61.5 Heparin Dosing Weight: 75 kg  Vital Signs: Temp: 98.1 F (36.7 C) (08/26 0730) Temp Source: Oral (08/26 0730) BP: 121/85 (08/26 0800) Pulse Rate: 66 (08/26 0800)  Labs: Recent Labs    06/27/18 1133 06/28/18 0308 06/28/18 1657 06/29/18 0730 06/29/18 0757  HGB 8.8*  --   --  8.9*  --   HCT 29.9*  --   --  29.6*  --   PLT 142*  --   --  136*  --   LABPROT 19.3* 17.8*  --   --  17.6*  INR 1.64 1.48  --   --  1.46  HEPARINUNFRC  --   --  0.27*  --  0.15*  CREATININE 2.89*  --   --   --   --     Estimated Creatinine Clearance: 22.2 mL/min (A) (by C-G formula based on SCr of 2.89 mg/dL (H)).   Medical History: Past Medical History:  Diagnosis Date  . AICD (automatic cardioverter/defibrillator) present   . Asthma   . AVM (arteriovenous malformation) of colon 07/07/2017  . CHF (congestive heart failure) ([place])   . Diabetes ([place])   . ESRF (end stage renal failure) ([place]) 07/07/2017   pt receiving hemodialysis Monday- Wednesday-friday, sometimes Saturday  . Kidney disease   . LVAD (left ventricular assist device) present ([place]) 12/2016  . Permanent atrial fibrillation ([place]) 07/07/2017  . Presence of permanent cardiac pacemaker    AICD  . Sleep apnea     Assessment: 1 yoM with LVAD and significant hx of GIB presents with abdominal pain and subtherapeutic INR. Pharmacy consulted to resume warfarin. INR fell 1.9>1.6>1.48 despite boosted dose on 8/24.   Heparin level slightly below goal at 0.15, no issues with infusion per RN. INR remains below goal at 1.46. After discussion with team, no longer planning for PD catheter placement at this time and will pursue home HD.  Goal of Therapy:  INR 1.8-2.3 HL ~0.2 Monitor platelets by anticoagulation protocol: Yes    Plan:  -Warfarin 7.5mg  PO x1 tonight - will give slight boost with no further plans for PD catheter -Heparin to 1050 units/hr -Check 8hr heparin level -Daily INR, heparin level, CBC  Arrie Senate, PharmD, BCPS Clinical Pharmacist [phone] Please check AMION for all [place] numbers 06/29/2018

## 2018-06-29 NOTE — Progress Notes (Signed)
[place] for warfarin / heparin Indication: LVAD  No Known Allergies  Patient Measurements: Height: 5\' 5"  (165.1 cm) Weight: 165 lb 9.1 oz (75.1 kg) IBW/kg (Calculated) : 61.5 Heparin Dosing Weight: 75 kg  Vital Signs: Temp: 98.8 F (37.1 C) (08/26 1537) Temp Source: Oral (08/26 1537) BP: 94/67 (08/26 1537) Pulse Rate: 77 (08/26 1537)  Labs: Recent Labs    06/27/18 1133 06/28/18 0308 06/28/18 1657 06/29/18 0730 06/29/18 0757 06/29/18 1625  HGB 8.8*  --   --  8.9*  --   --   HCT 29.9*  --   --  29.6*  --   --   PLT 142*  --   --  136*  --   --   LABPROT 19.3* 17.8*  --   --  17.6*  --   INR 1.64 1.48  --   --  1.46  --   HEPARINUNFRC  --   --  0.27*  --  0.15* 0.25*  CREATININE 2.89*  --   --  3.84*  --   --     Estimated Creatinine Clearance: 16.5 mL/min (A) (by C-G formula based on SCr of 3.84 mg/dL (H)).   Medical History: Past Medical History:  Diagnosis Date  . AICD (automatic cardioverter/defibrillator) present   . Asthma   . AVM (arteriovenous malformation) of colon 07/07/2017  . CHF (congestive heart failure) ([place])   . Diabetes ([place])   . ESRF (end stage renal failure) ([place]) 07/07/2017   pt receiving hemodialysis Monday- Wednesday-friday, sometimes Saturday  . Kidney disease   . LVAD (left ventricular assist device) present ([place]) 12/2016  . Permanent atrial fibrillation ([place]) 07/07/2017  . Presence of permanent cardiac pacemaker    AICD  . Sleep apnea     Assessment: 2 yoM with LVAD and significant hx of GIB presents with abdominal pain and subtherapeutic INR. Pharmacy consulted to resume warfarin. INR fell 1.9>1.6>1.48 despite boosted dose on 8/24.   Heparin level slightly below goal at 0.15, no issues with infusion per RN. INR remains below goal at 1.46. After discussion with team, no longer planning for PD catheter placement at this time and will pursue home HD.  PM f/u > heparin level now at goal on heparin  at 1050 units/hr.  No overt bleeding or complications noted.  Goal of Therapy:  INR 1.8-2.3 HL ~0.2 Monitor platelets by anticoagulation protocol: Yes   Plan:  -Continue IV heparin at current rate. -Daily INR, heparin level, CBC  [place] Crane, Roylene Reason, [facility] Clinical Pharmacist Phone [phone]  06/29/2018 5:05 PM

## 2018-06-29 NOTE — Progress Notes (Signed)
LVAD Coordinator Rounding Note:  HM II LVAD implanted on 12/13/16 by Dr. Darcey Nora under Destination Therapy criteria due to age excluding heart transplantation.  Pt admitted 06/22/18 for nausea and vomiting.    Pt sitting up on side of bed eating lunch. Said dialysis went "ok."  Verbalized that he would like more information regarding home HD.    Vital signs: Temp:  98.6 HR: 98 - afib  Automatic BP: 120/59 (100) Doppler: pt refused O2 Sat: 96 % RA  Wt: 78.2>74.8>72.7>72.1>75.1kg  LVAD interrogation reveals:  Speed:  9200 Flow:  4.5 Power: 5.2w PI: 6.7 Alarms: none Events: none  Fixed speed: 9200 Low speed limit: 8600   Drive Line:  Right abd dressing CDI. Next dressing change due 06/29/18. Bedside nurse may change dressing.   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 Margins not clear at neck. - 02/23/18> hospitalization for GI bleed. Colon/EGD 02/27/18 with normal EGD. Colonoscopy identified active bleeding in ileum; source not found. Capsule Endoscopy 02/27/18: Showed active bleeding in the ileum. Received 5 units blood with histamine reaction; will give benadryl as OP. - 03/10/18>transferrred to [facility] for double retro balloon scop and sent to IR emolization x 3 with no evidence of bleeding. Received 12 units PCs, remained off anticoagulants. Transferred back to [facility] 03/20/18 Seen by IR 5/20. CTA obtained, but no active bleeding found. Started  on octreotide. 03/25/18: Repeat capsule endoscopy completed. Bleeding appears to come from the mid-ileum. 03/26/18: General surgery reconsulted. Recommended transfer back to [facility] for surgery with intraoperative endoscopy. [name] currently discussing options. Ortho consulted 5/24 for left knee pain. S/p knee aspiration and steroid injection.  Dose of premarin given 5/24.  On 5/25 developed abdominal bloating and distension with n/v. KUB suggestive of ileus versus early SBO. NGT placed for decompression.  Had large bloody BM after this and felt much better, diet now advanced to regular.  - 04/01/18 transferred to [facility] for  laparoscopic surgical enteroscopy for recurrent GI bleed   Plan/Recommendations: 1.  Weekly dressing changes per bedside nurse. Next dressing change due 06/29/18. 2.  Call VAD pager if any questions re: VAD equipment or drive line site issues.   Emerson Monte RN, VAD Coordinator 24/7 VAD pager: [phone]

## 2018-06-29 NOTE — Procedures (Signed)
I was present at this dialysis session, have reviewed the session itself and made  appropriate changes  Jannifer Hick MD [place] pager [phone]   06/29/2018, 9:32 AM

## 2018-06-29 NOTE — Progress Notes (Addendum)
Advanced Heart Failure VAD Team Note  PCP-Cardiologist: No primary care provider on file.   Subjective:   In HD   Feeling ok. Denies SOB. Denies CP.   LVAD Interrogation HM 3: Speed: 9200 Flow: 4.3  PI: 7.1 Power: 5.  VAD interrogated personally. Parameters stable.   Objective:    Vital Signs:   Temp:  [98.1 F (36.7 C)-99.2 F (37.3 C)] 98.1 F (36.7 C) (08/26 0730) Pulse Rate:  [65-96] 66 (08/26 0800) Resp:  [10-27] 16 (08/26 0730) BP: (83-123)/(61-101) 121/85 (08/26 0800) SpO2:  [97 %-99 %] 98 % (08/26 0730) Weight:  [77.2 kg] 77.2 kg (08/26 0730) Last BM Date: 06/27/18 Mean arterial Pressure 80s-90s   Intake/Output:   Intake/Output Summary (Last 24 hours) at 06/29/2018 0832 Last data filed at 06/29/2018 0700 Gross per 24 hour  Intake 776.81 ml  Output 300 ml  Net 476.81 ml     Physical Exam    Physical Exam: GENERAL: NAD HEENT: normal  NECK: Supple, JVP  ~10 .  2+ bilaterally, no bruits.  No lymphadenopathy or thyromegaly appreciated.   CARDIAC:  Mechanical heart sounds with LVAD hum present.  LUNGS:  Clear to auscultation bilaterally.  ABDOMEN:  Soft, round, nontender, positive bowel sounds x4.     LVAD exit site: well-healed and incorporated.  Dressing dry and intact.  No erythema or drainage.  Stabilization device present and accurately applied.  Driveline dressing is being changed daily per sterile technique. EXTREMITIES:  Warm and dry, no cyanosis, clubbing, rash or edema RUE  AVF NEUROLOGIC:   Alert and oriented x 4.  Gait steady.  No aphasia.  No dysarthria.  Affect pleasant.      Telemetry    A fib 70s   EKG    No new tracings.   Labs   Basic Metabolic Panel: Recent Labs  Lab 06/23/18 0526 06/24/18 0748 06/24/18 1823 06/25/18 0248 06/26/18 0219 06/27/18 1133  NA 131* 133*  --  134* 134* 135  K 3.8 4.1  --  3.8 3.7 4.1  CL 97* 96*  --  97* 98 99  CO2 24 23  --  29 27 29   GLUCOSE 200* 118*  --  208* 171* 167*  BUN 34* 41*  --  20  28* 19  CREATININE 3.91* 4.43*  --  3.02* 3.73* 2.89*  CALCIUM 8.3* 8.5*  --  8.2* 8.1* 8.5*  MG  --   --  1.8 1.7  --   --   PHOS  --  5.3*  --   --   --   --     Liver Function Tests: Recent Labs  Lab 06/22/18 0849 06/24/18 0748  AST 33  --   ALT 30  --   ALKPHOS 87  --   BILITOT 1.0  --   PROT 6.4*  --   ALBUMIN 3.1* 3.1*   No results for input(s): LIPASE, AMYLASE in the last 168 hours. No results for input(s): AMMONIA in the last 168 hours.  CBC: Recent Labs  Lab 06/22/18 0849  06/24/18 0747 06/25/18 0248 06/26/18 0219 06/27/18 1133 06/29/18 0730  WBC 10.2   < > 9.9 7.7 6.9 7.6 7.0  NEUTROABS 8.6*  --   --   --   --   --   --   HGB 9.2*   < > 9.4* 9.0* 8.7* 8.8* 8.9*  HCT 30.8*   < > 30.7* 29.9* 29.0* 29.9* 29.6*  MCV 94.5   < > 92.7  93.7 93.9 95.2 95.2  PLT 159   < > 170 160 142* 142* 136*   < > = values in this interval not displayed.    INR: Recent Labs  Lab 06/25/18 0248 06/26/18 0219 06/27/18 1133 06/28/18 0308 06/29/18 0757  INR 2.15 1.92 1.64 1.48 1.46    Imaging   No results found.   Medications:     Scheduled Medications: . atorvastatin  40 mg Oral q1800  . budesonide (PULMICORT) nebulizer solution  0.25 mg Nebulization BID  . busPIRone  5 mg Oral BID  . calcitRIOL  0.5 mcg Oral Q M,W,F-HD  . carvedilol  6.25 mg Oral BID WC  . Chlorhexidine Gluconate Cloth  6 each Topical Q0600  . docusate sodium  200 mg Oral Daily  . dronabinol  2.5 mg Oral BID AC  . insulin aspart  0-5 Units Subcutaneous QHS  . insulin aspart  0-9 Units Subcutaneous TID WC  . insulin glargine  4 Units Subcutaneous QHS  . insulin glargine  6 Units Subcutaneous Daily  . levothyroxine  25 mcg Oral QAC breakfast  . megestrol  40 mg Oral Daily  . multivitamin  1 tablet Oral QHS  . pantoprazole  40 mg Oral Daily  . senna-docusate  1 tablet Oral BID  . sildenafil  20 mg Oral TID  . traZODone  100 mg Oral QHS  . Warfarin - Pharmacist Dosing Inpatient   Does not  apply q1800    Infusions: . heparin 1,000 Units/hr (06/29/18 0720)    PRN Medications: acetaminophen, albuterol, benzonatate, calcium carbonate, LORazepam, ondansetron (ZOFRAN) IV, polyethylene glycol, traMADol   Patient Profile   Trevor [name] is a 72 y.o. 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   Admitted 06/22/18 with abdominal pain, nausea, and vomiting.   Assessment/Plan:    1. Abdominal Pain/n/v -Resolved.  - Non-obstructive pattern on KUB. Large stool burden. Improved with enemas 2. Chronic systolic CHF: Ischemic cardiomyopathy - EF 20-25% with RV dysfunction on 2/18 echo pre-LVAD. 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 Medtronic ICD. He has had problems with RV failure post-op. He developed worsening renal dysfunction and finally had to start HD in 8/18. There has been some difficulty getting adequate fluid off him, currently dialyzing 4 days/week.  - Volume managed per HD.  - INR 1.4 Continue warfarin with INR goal 1.8 - 2.3. No ASA with GI bleeding.  3. CAD: s/p CABG.  - No s/s of ischemia.    - He is on atorvastatin.  4. ESRD: - Ongoing HD 4 days/week, see above. - Nephrology  following.  -Tolerating in-house HD fairly well. Volume status slightly elevated. Says he can tolerate better due to lower volume removal and fact that he can lie in bed during HD.  - Plan for trial of home HD, will need education.  Per Dr Deterding ok with home HD.  If not, will need to be seen at [facility] for PD evaluation.  5. Atrial fibrillation: Chronic.  - On warfarin. Pharm D dosing.   - Continue heparin gtt until INR 1.8.  - Rate controlled with low dose BB.  6. RV failure:  - Continue Revatio at 40  mg TID. No  change.  7. HTN:  - Maps 94.  - Continue carvedilol. Can add amlodipine or hydralazine as needed.  8. 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  - Hgb 8.9 No overt bleeding.   9. GI bleeding: GI AVMs. Now s/p laparoscopic surgical enteroscopy with 4 clips on 6/7/19at [name], c/b ischemic bowel/SBO.  - Hgb 8.9. Stable.  No overt bleeding.  - INR 1.4.  - On heparin drip until INR 1.8 or greater. Goal INR 1.8-2.3, adjust to keep in this range.  - Getting Aranesp per nephrology.  - Continue octreotide as outpatient.  10. Squamous cell skin cancer: Neck excision site without clear margins. He saw Dr Lisbeth Renshaw with radiation oncology, radiation was recommended. He wants to hold off for the time being, does not think he can handle radiation as HD wears him out 4 days/week. He now has a nodule concerning for skin cancer on his forehead, has been referred back to dermatology. No change.  11. H/o TAVR:  - Stable on last echo.  12. Septic Left Knee:s/p I&D and surgical wash out at [facility] -Getting PT - Follow with ortho.   Working on home dialysis possibility.   I reviewed the LVAD parameters from today, and compared the results to the patient's prior recorded data.  No programming changes were made.  The LVAD is functioning within specified parameters.  The patient performs LVAD self-test daily.  LVAD interrogation was negative for any significant power changes, alarms or PI events/speed drops.  LVAD equipment check completed and is in good working order.  Back-up equipment present.   LVAD education done on emergency procedures and precautions and reviewed exit site care.   Length of Stay: [place], NP  8:32 AM   VAD Team --- VAD ISSUES ONLY--- Pager [phone] (7am - 7am)  Advanced Heart Failure Team  Pager [phone] (M-F; 7a - 4p)  Please contact [facility] for night-coverage after hours (4p -7a ) and weekends on  [email or web address]  Patient seen with NP, agree with the above note.  He is getting HD today and tolerating without problems so far.   He has been having trouble with HD, frequent admissions with dyspnea and difficulty tolerating HD.  It looks like he is failing traditional HD.  The only options here will be home HD or PD.  PD will be difficult given abdominal surgeries, would have to go to [facility] for catheter placement evaluation.  For now, will try for home PD.  Discussed with Dr. Johnney Ou today.  He will get the education on home HD today and will decide on whether this will be feasible for him.  If so, will have training as outpatient.   He should be able to go home when INR therapeutic and we can stop heparin gtt, probably tomorrow.   Loralie Champagne 06/29/2018 10:31 AM

## 2018-06-30 ENCOUNTER — Inpatient Hospital Stay (HOSPITAL_COMMUNITY): Admit: 2018-06-30 | Payer: Medicare Other

## 2018-06-30 LAB — BASIC METABOLIC PANEL
Anion gap: 9 (ref 5–15)
BUN: 22 mg/dL (ref 8–23)
CALCIUM: 8.2 mg/dL — AB (ref 8.9–10.3)
CO2: 26 mmol/L (ref 22–32)
CREATININE: 2.45 mg/dL — AB (ref 0.61–1.24)
Chloride: 101 mmol/L (ref 98–111)
GFR calc Af Amer: 29 mL/min — ABNORMAL LOW (ref >60)
GFR calc non Af Amer: 25 mL/min — ABNORMAL LOW (ref >60)
Glucose, Bld: 215 mg/dL — ABNORMAL HIGH (ref 70–99)
Potassium: 3.7 mmol/L (ref 3.5–5.1)
SODIUM: 136 mmol/L (ref 135–145)

## 2018-06-30 LAB — HEPARIN LEVEL (UNFRACTIONATED): HEPARIN UNFRACTIONATED: 0.24 [IU]/mL — AB (ref 0.30–0.70)

## 2018-06-30 LAB — CBC
HCT: 27.9 % — ABNORMAL LOW (ref 39.0–52.0)
Hemoglobin: 8.4 g/dL — ABNORMAL LOW (ref 13.0–17.0)
MCH: 28.8 pg (ref 26.0–34.0)
MCHC: 30.1 g/dL (ref 30.0–36.0)
MCV: 95.5 fL (ref 78.0–100.0)
PLATELETS: 142 10*3/uL — AB (ref 150–400)
RBC: 2.92 MIL/uL — AB (ref 4.22–5.81)
RDW: 18.1 % — AB (ref 11.5–15.5)
WBC: 6.5 10*3/uL (ref 4.0–10.5)

## 2018-06-30 LAB — GLUCOSE, CAPILLARY
GLUCOSE-CAPILLARY: 150 mg/dL — AB (ref 70–99)
GLUCOSE-CAPILLARY: 148 mg/dL — AB (ref 70–99)
Glucose-Capillary: 187 mg/dL — ABNORMAL HIGH (ref 70–99)

## 2018-06-30 LAB — PROTIME-INR
INR: 1.52
PROTHROMBIN TIME: 18.2 s — AB (ref 11.4–15.2)

## 2018-06-30 LAB — LACTATE DEHYDROGENASE: LDH: 198 U/L — AB (ref 98–192)

## 2018-06-30 MED ORDER — CHLORHEXIDINE GLUCONATE CLOTH 2 % EX PADS: 6.0000 | MEDICATED_PAD | Freq: Every day | CUTANEOUS | Status: DC

## 2018-06-30 MED ORDER — DARBEPOETIN ALFA 100 MCG/0.5ML IJ SOSY
100.0000 ug | PREFILLED_SYRINGE | Freq: Once | INTRAMUSCULAR | Status: AC
  Administered 2018-06-30: 100 ug via INTRAVENOUS
  Filled 2018-06-30 (×2): qty 0.5

## 2018-06-30 MED ORDER — WARFARIN SODIUM 4 MG PO TABS
8.0000 mg | ORAL_TABLET | Freq: Once | ORAL | Status: AC
  Administered 2018-06-30: 8 mg via ORAL
  Filled 2018-06-30: qty 2

## 2018-06-30 NOTE — Progress Notes (Signed)
[facility] Progress Note  Subjective:  Mild diarrhea which he attributes to renal diet.  O/w feels fine.  No issues post HD yesterday.   Vitals:   06/29/18 2001 06/29/18 2359 06/30/18 0411 06/30/18 0739  BP: 108/70 (!) 100/43 98/84 100/78  Pulse:  (!) 145 82 70  Resp:  19 (!) 23 17  Temp:  98.3 F (36.8 C) 99 F (37.2 C) 97.9 F (36.6 C)  TempSrc:  Oral Oral Oral  SpO2:  93% 95% 95%  Weight:   82.5 kg   Height:        Inpatient medications: . atorvastatin  40 mg Oral q1800  . budesonide (PULMICORT) nebulizer solution  0.25 mg Nebulization BID  . busPIRone  5 mg Oral BID  . calcitRIOL  0.5 mcg Oral Q M,W,F-HD  . carvedilol  6.25 mg Oral BID WC  . Chlorhexidine Gluconate Cloth  6 each Topical Q0600  . docusate sodium  200 mg Oral Daily  . dronabinol  2.5 mg Oral BID AC  . insulin aspart  0-5 Units Subcutaneous QHS  . insulin aspart  0-9 Units Subcutaneous TID WC  . insulin glargine  4 Units Subcutaneous QHS  . insulin glargine  6 Units Subcutaneous Daily  . levothyroxine  25 mcg Oral QAC breakfast  . megestrol  40 mg Oral Daily  . multivitamin  1 tablet Oral QHS  . pantoprazole  40 mg Oral Daily  . senna-docusate  1 tablet Oral BID  . sildenafil  20 mg Oral TID  . traZODone  100 mg Oral QHS  . Warfarin - Pharmacist Dosing Inpatient   Does not apply q1800   . heparin 1,050 Units/hr (06/30/18 0800)   acetaminophen, albuterol, benzonatate, calcium carbonate, LORazepam, ondansetron (ZOFRAN) IV, polyethylene glycol, traMADol  Iron/TIBC/Ferritin/ %Sat    Component Value Date/Time   IRON 125 06/14/2017 1605   TIBC 221 (L) 06/14/2017 1605   FERRITIN 622 (H) 06/14/2017 1605   IRONPCTSAT 57 (H) 06/14/2017 1605    Exam: Genalert, not in distress, calm No jvd or bruits Chest clear ant, normal WOB Cor LVAD hum Abd soft ntnd  Ext trace edema Neuro is alert, Ox 3 , nf   Home meds: - atorvastatin 40 hs// levothyroxine 25 ug / megestrol 40 /  pantoprazole 40 qd  - trazodone 100 mg hs/ tramadol 50 qid prn/ lorazepam 0.5 tid prn/ buspirone 5 bid - sildenafil 20 mg tid - insulin glargine 4u am and 6u pm  - warfarin 5 mg qd  - albuterol prn / flovent prn/  fluticasone 2 puff bid  - other prn's/ vitamins  Dialysis:MWFSat [facility] 4.5h 73.5kg LUA AVF Hep none - mircera 225 last on 8/5  - calc 0.5ug tiw  CXR 8/19 > clear, no acute   Impression: 1 ESRD - MWFSat HD. HD tomorrow. Exploring option of home hemo -- current plan is for he and sister to go to [facility] home training unit [facility]) for tour, education and Q&A so they can make an informed decision regarding this option.  This will be post hospital discharge.  In the meantime he will continue in center HD at [facility] unit.  2 Anemia of CKD - aranesp 139mcg today (last mircera 251mcg was 8/5) 3 ICM/ sp LVAD 4 CAD hx CABG 5 AS sp TAVRS 6 DM2 7  Hx recurrent GIB - stable Hb,  no signs of bleeding - per above ESA today 8  Nutrition - doesn't need renal diet, pt  request - cardiology to change 9  Volume - euvolemic currently, continue 4x/wk HD   Plan - as above   Jannifer Hick MD [facility] pager [phone]   06/30/2018, 8:21 AM   Recent Labs  Lab 06/24/18 0748  06/29/18 0730 06/29/18 0757 06/30/18 0258  NA 133*   < > 134*  --  136  K 4.1   < > 4.2  --  3.7  CL 96*   < > 101  --  101  CO2 23   < > 25  --  26  GLUCOSE 118*   < > 134*  --  215*  BUN 41*   < > 36*  --  22  CREATININE 4.43*   < > 3.84*  --  2.45*  CALCIUM 8.5*   < > 8.5*  --  8.2*  PHOS 5.3*  --  3.5  --   --   ALBUMIN 3.1*  --  3.1*  --   --   INR  --    < >  --  1.46 1.52   < > = values in this interval not displayed.   No results for input(s): AST, ALT, ALKPHOS, BILITOT, PROT in the last 168 hours. Recent Labs  Lab 06/29/18 0730 06/30/18 0258  WBC 7.0 6.5  HGB 8.9* 8.4*  HCT 29.6* 27.9*  MCV 95.2 95.5  PLT 136* 142*

## 2018-06-30 NOTE — Discharge Summary (Signed)
Advanced Heart Failure Team  Discharge Summary   Patient ID: Trevor [name] MRN: [id number], DOB/AGE: 01-31-1946 72 y.o. Admit date: 06/22/2018 D/C date:     07/01/2018   Primary Discharge Diagnoses:  1. Abdominal pain with nausea and vomiting 2. 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 CAD s/p CABG 4. ESRD - HD MWFSa 5. Chronic afib 6. RV failure 7. HTN 8. Anemia 9. GI bleeding 10. Squamous cell skin cancer 11. Hx of TAVR 12. Hx septic left knee  Hospital Course:  Trevor [name] is a 71 y.o. 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   Admitted 06/22/18 with abdominal pain, nausea, and vomiting after HD. KUB showed large stool burden. Abdominal pain improved with enema. He has been feeling poorly after HD and has been admitted for symptoms frequently. Per renal, will trial home HD. If he does not tolerate this better, will need to follow up at [facility] for PD evaluation. Hospitalization prolonged due to low INR, requiring heparin bridge.  1. Abdominal Pain/n/v: Resolved.  Non-obstructive pattern on KUB. Large stool burden. Improved with enemas. Continue marinol and PRN ativan. Stool softeners and laxatives changed to PRN with diarrhea on day of discharge.  2. Chronic systolic CHF: Ischemic cardiomyopathy.  EF 20-25% with RV dysfunction on 2/18 echo pre-LVAD. 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 Medtronic ICD. He has had problems with RV failure post-op. He developed worsening renal dysfunction and finally had to start HD in 8/18. There has been some difficulty getting adequate fluid off him, currently dialyzing 4 days/week.  - Volume managed  per HD.  - Required heparin/coumadin bridge for low INR. INR goal is 1.8-2.3. Not on ASA with hx of GIB. 3. CAD: s/p CABG. No chest pain.   - He is on atorvastatin, coreg, and coumadin. 4. ESRD: Ongoing HD 4 days/week, see above.Nephrology followed. Tolerating in-house HD fairly well.  Says he can tolerate better due to lower volume removal and fact that he can lie in bed during HD.  - Pt and sister will go to [facility] home training unit for tour and education so that they can made an educated decision on whether they want to try home HD. If they decide against home HD, he will need to be seen at [facility] for PD evaluation.  5. Atrial fibrillation: Chronic.  - As above, required heparin/coumadin bridge. INR goal 1.8-2.3 - Rate controlled with low dose BB.  6. RV failure:  - Continue Revatio at 40 mg TID.   7. HTN: MAP stable.  - Continue carvedilol. Can add amlodipine or hydralazine as needed.  8. 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 Hgb remained stable with no overt bleeding. 9. GI bleeding: GI AVMs. Now s/p laparoscopic surgical enteroscopy with 4 clips on 6/7/19at [name], c/b ischemic bowel/SBO. Hemoglobin followed and remained stable with no overt bleeding. - Getting Aranesp per nephrology.  - Continue octreotide as outpatient.  10. Squamous cell skin cancer: Neck excision site without clear margins. He saw Dr Lisbeth Renshaw with radiation oncology, radiation was recommended. He wants to hold off for the  time being, does not think he can handle radiation as HD wears him out 4 days/week. He now has a nodule concerning for skin cancer on his forehead, has been referred back to dermatology.  11. H/o TAVR: Stable on last echo.  12. Septic Left Knee:s/p I&D and surgical wash out at [facility] with ortho.   He will be followed closely in VAD clinic with appointment as below. He will have INR check on 9/3 at dressing change  appointment. [facility] to resume [place] PT and RN. He will continue HD on MWFSa schedule.   LVAD Interrogation HM II:   Speed: 9200     Flow: 5.3      PI: 5.8      Power: 6       Back-up speed: 8600       Discharge Weight: 163 lbs Discharge Vitals: Blood pressure 92/71, pulse 71, temperature 98.8 F (37.1 C), temperature source Oral, resp. rate 11, height 5\' 5"  (1.651 m), weight 74 kg, SpO2 95 %.  Labs: Lab Results  Component Value Date   WBC 6.8 07/01/2018   HGB 8.6 (L) 07/01/2018   HCT 29.1 (L) 07/01/2018   MCV 96.0 07/01/2018   PLT 139 (L) 07/01/2018    Recent Labs  Lab 07/01/18 0729  NA 135  K 3.8  CL 100  CO2 25  BUN 30*  CREATININE 3.36*  CALCIUM 8.5*  GLUCOSE 126*   Lab Results  Component Value Date   CHOL 59 12/04/2016   HDL 30 (L) 12/04/2016   LDLCALC 15 12/04/2016   TRIG 68 12/04/2016   BNP (last 3 results) Recent Labs    05/25/18 1905  BNP 1,084.4*    ProBNP (last 3 results) No results for input(s): PROBNP in the last 8760 hours.   Diagnostic Studies/Procedures   No results found.  Discharge Medications   Allergies as of 07/01/2018   No Known Allergies     Medication List    STOP taking these medications   doxycycline 100 MG capsule Commonly known as:  VIBRAMYCIN     TAKE these medications   acetaminophen 325 MG tablet Commonly known as:  TYLENOL Take 2 tablets (650 mg total) by mouth every 4 (four) hours as needed for headache or mild pain.   albuterol (2.5 MG/3ML) 0.083% nebulizer solution Commonly known as:  PROVENTIL Take 2.5 mg by nebulization every 4 (four) hours as needed for wheezing or shortness of breath.   albuterol 108 (90 Base) MCG/ACT inhaler Commonly known as:  PROVENTIL HFA;VENTOLIN HFA Inhale 2 puffs into the lungs every 4 (four) hours as needed for wheezing or shortness of breath.   atorvastatin 40 MG tablet Commonly known as:  LIPITOR Take 1 tablet (40 mg total) by mouth daily at 6 PM.   benzonatate 100 MG  capsule Commonly known as:  TESSALON Take 1 capsule (100 mg total) by mouth 3 (three) times daily as needed for cough.   busPIRone 5 MG tablet Commonly known as:  BUSPAR Take 1 tablet (5 mg total) by mouth 2 (two) times daily.   calcitRIOL 0.5 MCG capsule Commonly known as:  ROCALTROL Take 1 capsule (0.5 mcg total) by mouth every Monday, Wednesday, and Friday with hemodialysis.   carvedilol 6.25 MG tablet Commonly known as:  COREG Take 1 tablet (6.25 mg total) by mouth 2 (two) times daily with a meal.   Darbepoetin Alfa 200 MCG/0.4ML Sosy injection Commonly known as:  ARANESP Inject 0.4 mLs (200 mcg total) into  the vein every Monday with hemodialysis.   docusate sodium 250 MG capsule Commonly known as:  COLACE Take 1 capsule (250 mg total) by mouth daily as needed for constipation. What changed:    when to take this  reasons to take this   dronabinol 2.5 MG capsule Commonly known as:  MARINOL Take 1 capsule (2.5 mg total) by mouth 2 (two) times daily before lunch and supper.   fluticasone 110 MCG/ACT inhaler Commonly known as:  FLOVENT HFA Inhale 2 puffs into the lungs 2 (two) times daily.   hydrocerin Crea Apply 1 application topically 2 (two) times daily. What changed:    when to take this  reasons to take this   insulin glargine 100 UNIT/ML injection Commonly known as:  LANTUS Use 4 units in morning and 6 units at bedtime. What changed:    how much to take  how to take this  when to take this  additional instructions   levothyroxine 25 MCG tablet Commonly known as:  SYNTHROID, LEVOTHROID Take 1 tablet (25 mcg total) by mouth daily before breakfast.   LORazepam 0.5 MG tablet Commonly known as:  ATIVAN Take 1 tablet (0.5 mg total) by mouth every 8 (eight) hours as needed for anxiety (for nausea or anxiety).   megestrol 40 MG tablet Commonly known as:  MEGACE Take 1 tablet (40 mg total) by mouth daily.   multivitamin Tabs tablet Take 1 tablet by  mouth at bedtime.   pantoprazole 40 MG tablet Commonly known as:  PROTONIX Take 1 tablet (40 mg total) by mouth daily.   polyethylene glycol packet Commonly known as:  MIRALAX / GLYCOLAX Take 17 g by mouth daily as needed for mild constipation.   senna-docusate 8.6-50 MG tablet Commonly known as:  Senokot-S Take 1 tablet by mouth at bedtime as needed for mild constipation. What changed:    when to take this  reasons to take this   sildenafil 20 MG tablet Commonly known as:  REVATIO Take 1 tablet (20 mg total) by mouth 3 (three) times daily.   traMADol 50 MG tablet Commonly known as:  ULTRAM Take 1 tablet (50 mg total) by mouth every 6 (six) hours as needed.   traZODone 50 MG tablet Commonly known as:  DESYREL Take 2 tablets (100 mg total) by mouth at bedtime.   warfarin 5 MG tablet Commonly known as:  COUMADIN Take as directed. If you are unsure how to take this medication, talk to your nurse or doctor. Original instructions:  Take 5 mg by mouth daily.            Durable Medical Equipment  (From admission, onward)         Start     Ordered   06/30/18 1610  Heart failure home health orders  (Heart failure home health orders / Face to face)  Once    Comments:  Heart Failure Follow-up Care:  Verify follow-up appointments per Patient Discharge Instructions. Confirm transportation arranged. Reconcile home medications with discharge medication list. Remove discontinued medications from use. Assist patient/caregiver to manage medications using pill box. Reinforce low sodium food selection Assessments: Vital signs and oxygen saturation at each visit. Assess home environment for safety concerns, caregiver support and availability of low-sodium foods. Consult Education officer, museum, PT/OT, Dietitian, and CNA based on assessments. Perform comprehensive cardiopulmonary assessment. Notify MD for any change in condition or weight gain of 3 pounds in one day or 5 pounds in one week  with symptoms. Daily Weights  and Symptom Monitoring: Ensure patient has access to scales. Teach patient/caregiver to weigh daily before breakfast and after voiding using same scale and record.    Teach patient/caregiver to track weight and symptoms and when to notify Provider. Activity: Develop individualized activity plan with patient/caregiver.  HHRN 2 wk 2 for CP assessment   PT 2 wk 2 for CP Rehab.  Question Answer Comment  Heart Failure Follow-up Care Advanced Heart Failure (AHF) Clinic at [phone]   Lab frequency Other see comments   Fax lab results to AHF Clinic at [phone]   Diet Low Sodium Heart Healthy   Fluid restrictions: 2000 mL Fluid   Initiate Heart Failure Clinic Diuretic Protocol to be used by [place] only ( to be ordered by Heart Failure Team Providers Only) No      06/30/18 1611          Disposition   The patient will be discharged in stable condition to home. Discharge Instructions    (HEART FAILURE PATIENTS) Call MD:  Anytime you have any of the following symptoms: 1) 3 pound weight gain in 24 hours or 5 pounds in 1 week 2) shortness of breath, with or without a dry hacking cough 3) swelling in the hands, feet or stomach 4) if you have to sleep on extra pillows at night in order to breathe.   Complete by:  As directed    Call MD for:  persistant nausea and vomiting   Complete by:  As directed    Call MD for:  redness, tenderness, or signs of infection (pain, swelling, redness, odor or green/yellow discharge around incision site)   Complete by:  As directed    Diet - low sodium heart healthy   Complete by:  As directed    INR  Goal: 1.8 - 2.3   Complete by:  As directed    Goal:  1.8 - 2.3   Increase activity slowly   Complete by:  As directed    Page VAD Coordinator at [phone]  Notify for: any VAD alarms, sustained elevations of power >10 watts, sustained drop in Pulse Index <3   Complete by:  As directed    Notify for:    any VAD alarms sustained elevations of power >10 watts sustained drop in Pulse Index <3     Speed Settings:   Complete by:  As directed    Fixed 9200 RPM Low 8600 RPM     Follow-up Information    Larey Dresser, MD Follow up on 07/07/2018.   Specialty:  Cardiology Why:  0900 am [address]  Contact information: [place] [address] [phone]             Duration of Discharge Encounter: Greater than 35 minutes   Signed, Georgiana Shore, NP  07/01/2018, 1:03 PM

## 2018-06-30 NOTE — Plan of Care (Signed)
  Problem: Health Behavior/Discharge Planning: Goal: Ability to manage health-related needs will improve Outcome: Progressing   Problem: Clinical Measurements: Goal: Ability to maintain clinical measurements within normal limits will improve Outcome: Progressing Goal: Will remain free from infection Outcome: Progressing Goal: Diagnostic test results will improve Outcome: Progressing Goal: Respiratory complications will improve Outcome: Progressing Goal: Cardiovascular complication will be avoided Outcome: Progressing   Problem: Nutrition: Goal: Adequate nutrition will be maintained Outcome: Progressing   Problem: Coping: Goal: Level of anxiety will decrease Outcome: Progressing   Problem: Elimination: Goal: Will not experience complications related to bowel motility Outcome: Progressing Goal: Will not experience complications related to urinary retention Outcome: Progressing   Problem: Pain Managment: Goal: General experience of comfort will improve Outcome: Progressing   Problem: Skin Integrity: Goal: Risk for impaired skin integrity will decrease Outcome: Progressing   Problem: Cardiac: Goal: LVAD will function as expected and patient will experience no clinical alarms Outcome: Progressing   Problem: Education: Goal: Patient will understand all VAD equipment and how it functions Outcome: Progressing Goal: Patient will be able to verbalize current INR target range and antiplatelet therapy for discharge home Outcome: Progressing

## 2018-06-30 NOTE — Progress Notes (Signed)
LVAD Coordinator Rounding Note:  HM II LVAD implanted on 12/13/16 by Dr. Darcey Nora under Destination Therapy criteria due to age excluding heart transplantation.  Pt admitted 06/22/18 for nausea and vomiting.    Pt asleep in bed this morning. States that he had a good breakfast this morning. Reports 2 very soft (not liquid) bowel movements overnight.    Vital signs: Temp:  97.9 HR: 72- afib  Automatic BP: 100/78 (85) Doppler: 108 O2 Sat: 95 % RA  Wt: 78.2>74.8>72.7>72.1>75.1>82.5kg  LVAD interrogation reveals:  Speed:  9200 Flow:  5.2 Power: 5.5w PI: 5.0 Alarms: none Events: none  Fixed speed: 9200 Low speed limit: 8600   Drive Line: Existing VAD dressing removed and site care performed using sterile technique. Drive line exit site cleaned with sterile saline wipe x2, allowed to dry, and Sorbaview dressing re-applied. Exit site healed and incorporated, the velour is fully implanted at exit site. No redness, tenderness, drainage, foul odor or rash noted. Drive line anchor re-applied. Covered dressing and anchor with large tegaderm.   Labs:  LDH trend:  251>217>205>203>191>193>194>198  INR trend: 1.73>1.96>1.91>1.85>2.15>1.92>1.46>1.52  Anticoagulation Plan: - INR Goal: 1.8-2.3 - ASA Dose: none due to hx of GI bleed - OP monthly Octreotide for hx of GI bleed  Blood Products:  None this admission  Device: - Medtronic single lead -Therapies: on 231 bpm  Arrythmias: chronic afib  Renal:  - chronic HD on M/W/F/Sat [facility]  Adverse Events on VAD: - 02/2017> GIB- AVM clipped x2 - 07/2017> renal failure- HD started - 11/2017>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 Margins not clear at neck. - 02/23/18> hospitalization for GI bleed. Colon/EGD 02/27/18 with normal EGD. Colonoscopy identified active bleeding in ileum; source not found. Capsule Endoscopy 02/27/18: Showed active bleeding in the ileum. Received 5 units blood  with histamine reaction; will give benadryl as OP. - 03/10/18>transferrred to [facility] for double retro balloon scop and sent to IR emolization x 3 with no evidence of bleeding. Received 12 units PCs, remained off anticoagulants. Transferred back to [facility] 03/20/18 Seen by IR 5/20. CTA obtained, but no active bleeding found. Started on octreotide. 03/25/18: Repeat capsule endoscopy completed. Bleeding appears to come from the mid-ileum. 03/26/18: General surgery reconsulted. Recommended transfer back to [facility] for surgery with intraoperative endoscopy. [name] currently discussing options. Ortho consulted 5/24 for left knee pain. S/p knee aspiration and steroid injection.  Dose of premarin given 5/24.  On 5/25 developed abdominal bloating and distension with n/v. KUB suggestive of ileus versus early SBO. NGT placed for decompression.  Had large bloody BM after this and felt much better, diet now advanced to regular.  - 04/01/18 transferred to [facility] for  laparoscopic surgical enteroscopy for recurrent GI bleed   Plan/Recommendations: 1.  Weekly dressing changes per bedside nurse. Next dressing change due 07/07/18. 2.  Call VAD pager if any questions re: VAD equipment or drive line site issues.   Emerson Monte RN, VAD Coordinator 24/7 VAD pager: [phone]

## 2018-06-30 NOTE — Progress Notes (Signed)
[place] for warfarin / heparin Indication: LVAD  No Known Allergies  Patient Measurements: Height: 5\' 5"  (165.1 cm) Weight: 181 lb 14.1 oz (82.5 kg) IBW/kg (Calculated) : 61.5 Heparin Dosing Weight: 75 kg  Vital Signs: Temp: 97.9 F (36.6 C) (08/27 0739) Temp Source: Oral (08/27 0739) BP: 100/78 (08/27 0739) Pulse Rate: 70 (08/27 0739)  Labs: Recent Labs    06/27/18 1133 06/28/18 0308  06/29/18 0730 06/29/18 0757 06/29/18 1625 06/30/18 0258  HGB 8.8*  --   --  8.9*  --   --  8.4*  HCT 29.9*  --   --  29.6*  --   --  27.9*  PLT 142*  --   --  136*  --   --  142*  LABPROT 19.3* 17.8*  --   --  17.6*  --  18.2*  INR 1.64 1.48  --   --  1.46  --  1.52  HEPARINUNFRC  --   --    < >  --  0.15* 0.25* 0.24*  CREATININE 2.89*  --   --  3.84*  --   --  2.45*   < > = values in this interval not displayed.    Estimated Creatinine Clearance: 26.9 mL/min (A) (by C-G formula based on SCr of 2.45 mg/dL (H)).   Medical History: Past Medical History:  Diagnosis Date  . AICD (automatic cardioverter/defibrillator) present   . Asthma   . AVM (arteriovenous malformation) of colon 07/07/2017  . CHF (congestive heart failure) ([place])   . Diabetes ([place])   . ESRF (end stage renal failure) ([place]) 07/07/2017   pt receiving hemodialysis Monday- Wednesday-friday, sometimes Saturday  . Kidney disease   . LVAD (left ventricular assist device) present ([place]) 12/2016  . Permanent atrial fibrillation ([place]) 07/07/2017  . Presence of permanent cardiac pacemaker    AICD  . Sleep apnea     Assessment: 48 yoM with LVAD and significant hx of GIB presents with abdominal pain and subtherapeutic INR. Pharmacy consulted to resume warfarin. INR fell 1.9>1.6>1.48 despite boosted dose on 8/24.   Heparin level at goal at 0.24, INR subtherapeutic but trending up. Planning to discharge pt home tomorrow if INR closer to 1.8. CBC and LDH stable.  Goal of Therapy:  INR  1.8-2.3 HL ~0.2 Monitor platelets by anticoagulation protocol: Yes   Plan:  -Warfarin 8mg  PO x1 tonight - boosting to facilitate discharge tomorrow -Heparin 1050 units/hr -Daily INR, heparin level, CBC  Arrie Senate, PharmD, BCPS Clinical Pharmacist [phone] Please check AMION for all [place] numbers 06/30/2018

## 2018-06-30 NOTE — Care Management Note (Addendum)
Case Management Note  Patient Details  Name: Trevor [name] MRN: [id number] Date of Birth: 1946-05-12  Subjective/Objective:    Pt admitted with N/V  And abdominal pain - pt has LVAD                Action/Plan:  PTA independent from home with [facility] following for [facility] and PT.  Pt is ESRD on outp HD - being considered for PD at some point in the future.  Agency aware of admit and will resume services at discharge.  CM requested resumption orders.     Expected Discharge Date:  06/24/18               Expected Discharge Plan:  Day  In-House Referral:  Clinical Social Work  Discharge planning Services  CM Consult  Post Acute Care Choice:  Resumption of Svcs/PTA Provider Choice offered to:  Patient  DME Arranged:    DME Agency:     HH Arranged:  RN, PT [place] Agency:  [facility]  Status of Service:  In process, will continue to follow  If discussed at Long Length of Stay Meetings, dates discussed:    Additional Comments Update:  Orders written - [facility] informed of resumption orders [name], RN 06/30/2018, 4:49 PM

## 2018-06-30 NOTE — Progress Notes (Signed)
Patient ID: Trevor [name], male   DOB: 07-05-1946, 72 y.o.   MRN: [id number]   Advanced Heart Failure VAD Team Note  PCP-Cardiologist: No primary care provider on file.   Subjective:    Tolerated HD yesterday without problems.  Had some loose stool this morning, otherwise no complaints.  INR 1.5 today, remains on heparin gtt. Had some education on home HD yesterday.   LVAD Interrogation HM 3: Speed: 9200 Flow: 4.6  PI: 6.7 Power: 5.1.  No PI events.    Objective:    Vital Signs:   Temp:  [97.9 F (36.6 C)-99 F (37.2 C)] 97.9 F (36.6 C) (08/27 0739) Pulse Rate:  [42-145] 70 (08/27 0739) Resp:  [13-23] 17 (08/27 0739) BP: (94-126)/(43-90) 100/78 (08/27 0739) SpO2:  [91 %-98 %] 95 % (08/27 0739) Weight:  [75.1 kg-82.5 kg] 82.5 kg (08/27 0411) Last BM Date: 06/27/18 Mean arterial Pressure 80s-90s   Intake/Output:   Intake/Output Summary (Last 24 hours) at 06/30/2018 0823 Last data filed at 06/30/2018 0800 Gross per 24 hour  Intake 391.48 ml  Output 2700 ml  Net -2308.52 ml     Physical Exam    Physical Exam: General: Well appearing this am. NAD.  HEENT: Normal. Neck: Supple, JVP 8-9 cm cm. Carotids OK.  Cardiac:  Mechanical heart sounds with LVAD hum present.  Lungs:  CTAB, normal effort.  Abdomen:  NT, ND, no HSM. No bruits or masses. +BS  LVAD exit site: Well-healed and incorporated. Dressing dry and intact. No erythema or drainage. Stabilization device present and accurately applied. Driveline dressing changed daily per sterile technique. Extremities:  Warm and dry. No cyanosis, clubbing, rash.  1+ ankle edema.  Neuro:  Alert & oriented x 3. Cranial nerves grossly intact. Moves all 4 extremities w/o difficulty. Affect pleasant     Telemetry   Atrial fibrillation 70s, short run NSVT (personally reviewed)  EKG    No new tracings.   Labs   Basic Metabolic Panel: Recent Labs  Lab 06/24/18 0748 06/24/18 1823 06/25/18 0248 06/26/18 0219 06/27/18 1133  06/29/18 0730 06/30/18 0258  NA 133*  --  134* 134* 135 134* 136  K 4.1  --  3.8 3.7 4.1 4.2 3.7  CL 96*  --  97* 98 99 101 101  CO2 23  --  29 27 29 25 26   GLUCOSE 118*  --  208* 171* 167* 134* 215*  BUN 41*  --  20 28* 19 36* 22  CREATININE 4.43*  --  3.02* 3.73* 2.89* 3.84* 2.45*  CALCIUM 8.5*  --  8.2* 8.1* 8.5* 8.5* 8.2*  MG  --  1.8 1.7  --   --   --   --   PHOS 5.3*  --   --   --   --  3.5  --     Liver Function Tests: Recent Labs  Lab 06/24/18 0748 06/29/18 0730  ALBUMIN 3.1* 3.1*   No results for input(s): LIPASE, AMYLASE in the last 168 hours. No results for input(s): AMMONIA in the last 168 hours.  CBC: Recent Labs  Lab 06/25/18 0248 06/26/18 0219 06/27/18 1133 06/29/18 0730 06/30/18 0258  WBC 7.7 6.9 7.6 7.0 6.5  HGB 9.0* 8.7* 8.8* 8.9* 8.4*  HCT 29.9* 29.0* 29.9* 29.6* 27.9*  MCV 93.7 93.9 95.2 95.2 95.5  PLT 160 142* 142* 136* 142*    INR: Recent Labs  Lab 06/26/18 0219 06/27/18 1133 06/28/18 0308 06/29/18 0757 06/30/18 0258  INR 1.92 1.64 1.48  1.46 1.52    Imaging   No results found.   Medications:     Scheduled Medications: . atorvastatin  40 mg Oral q1800  . budesonide (PULMICORT) nebulizer solution  0.25 mg Nebulization BID  . busPIRone  5 mg Oral BID  . calcitRIOL  0.5 mcg Oral Q M,W,F-HD  . carvedilol  6.25 mg Oral BID WC  . Chlorhexidine Gluconate Cloth  6 each Topical Q0600  . docusate sodium  200 mg Oral Daily  . dronabinol  2.5 mg Oral BID AC  . insulin aspart  0-5 Units Subcutaneous QHS  . insulin aspart  0-9 Units Subcutaneous TID WC  . insulin glargine  4 Units Subcutaneous QHS  . insulin glargine  6 Units Subcutaneous Daily  . levothyroxine  25 mcg Oral QAC breakfast  . megestrol  40 mg Oral Daily  . multivitamin  1 tablet Oral QHS  . pantoprazole  40 mg Oral Daily  . senna-docusate  1 tablet Oral BID  . sildenafil  20 mg Oral TID  . traZODone  100 mg Oral QHS  . Warfarin - Pharmacist Dosing Inpatient   Does  not apply q1800    Infusions: . heparin 1,050 Units/hr (06/30/18 0800)    PRN Medications: acetaminophen, albuterol, benzonatate, calcium carbonate, LORazepam, ondansetron (ZOFRAN) IV, polyethylene glycol, traMADol   Patient Profile   SKYLUR FUSTON is a 72 y.o. 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   Admitted 06/22/18 with abdominal pain, nausea, and vomiting.   Assessment/Plan:    1. Abdominal Pain/n/v: Resolved.  Non-obstructive pattern on KUB. Large stool burden. Improved with enemas.  2. Chronic systolic CHF: Ischemic cardiomyopathy.  EF 20-25% with RV dysfunction on 2/18 echo pre-LVAD. 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 Medtronic ICD. He has had problems with RV failure post-op. He developed worsening renal dysfunction and finally had to start HD in 8/18. There has been some difficulty getting adequate fluid off him, currently dialyzing 4 days/week.  - Volume managed per HD.  - INR 1.5 Continue warfarin with INR goal 1.8 - 2.3, will continue heparin gtt until INR 1.8. No ASA with GI bleeding.  3. CAD: s/p CABG. No chest pain.   - He is on atorvastatin.  4. ESRD: Ongoing HD 4 days/week, see above.Nephrology  following. Tolerating in-house HD fairly well.  Says he can tolerate better due to lower volume removal and fact that he can lie in bed during HD.  - Plan for trial of home HD, will need education.  Per Dr Deterding ok with home HD.  He will have further education at HD unit after discharge (had initial discussion yesterday).  If not, will need to be seen at [facility] for PD evaluation.  5. Atrial fibrillation: Chronic. On warfarin. Pharm D dosing.   - Continue heparin gtt until INR 1.8.    - Rate controlled with low dose BB.  6. RV failure:  - Continue Revatio at 40 mg TID. No change.  7. HTN: MAP stable.  - Continue carvedilol. Can add amlodipine or hydralazine as needed.  8. 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 Hgb 8.4 No overt bleeding.  9. GI bleeding: GI AVMs. Now s/p laparoscopic surgical enteroscopy with 4 clips on 6/7/19at [name], c/b ischemic bowel/SBO. Hgb 8.4. Stable.  No overt bleeding.   - On heparin drip until INR 1.8 or greater. Goal INR 1.8-2.3, adjust to keep in this range.  - Getting Aranesp per nephrology.  - Continue octreotide as outpatient.  10. Squamous cell skin cancer: Neck excision site without clear margins. He saw Dr Lisbeth Renshaw with radiation oncology, radiation was recommended. He wants to hold off for the time being, does not think he can handle radiation as HD wears him out 4 days/week. He now has a nodule concerning for skin cancer on his forehead, has been referred back to dermatology. No change.  11. H/o TAVR: Stable on last echo.  12. Septic Left Knee:s/p I&D and surgical wash out at [facility] -Getting PT - Follow with ortho.   Working on home dialysis possibility. Hopefully home after HD tomorrow if INR around 1.8.  Further discussion of home HD will be at outpatient dialysis unit.   I reviewed the LVAD parameters from today, and compared the results to the patient's prior recorded data.  No programming changes were made.  The LVAD is functioning within specified parameters.  The patient performs LVAD self-test daily.  LVAD interrogation was negative for any significant power changes, alarms or PI events/speed drops.  LVAD equipment check completed and is in good working order.  Back-up equipment present.   LVAD education done on emergency procedures and precautions and reviewed exit site care.   Length of Stay: 8  Loralie Champagne, MD  8:23 AM  VAD Team --- VAD ISSUES ONLY--- Pager  [phone] (7am - 7am)  Advanced Heart Failure Team  Pager [phone] (M-F; 7a - 4p)  Please contact [facility] for night-coverage after hours (4p -7a ) and weekends on [email or web address]

## 2018-07-01 LAB — CBC
HEMATOCRIT: 29.1 % — AB (ref 39.0–52.0)
Hemoglobin: 8.6 g/dL — ABNORMAL LOW (ref 13.0–17.0)
MCH: 28.4 pg (ref 26.0–34.0)
MCHC: 29.6 g/dL — AB (ref 30.0–36.0)
MCV: 96 fL (ref 78.0–100.0)
Platelets: 139 10*3/uL — ABNORMAL LOW (ref 150–400)
RBC: 3.03 MIL/uL — ABNORMAL LOW (ref 4.22–5.81)
RDW: 18.3 % — AB (ref 11.5–15.5)
WBC: 6.8 10*3/uL (ref 4.0–10.5)

## 2018-07-01 LAB — RENAL FUNCTION PANEL
ALBUMIN: 3.1 g/dL — AB (ref 3.5–5.0)
Anion gap: 10 (ref 5–15)
BUN: 30 mg/dL — ABNORMAL HIGH (ref 8–23)
CHLORIDE: 100 mmol/L (ref 98–111)
CO2: 25 mmol/L (ref 22–32)
Calcium: 8.5 mg/dL — ABNORMAL LOW (ref 8.9–10.3)
Creatinine, Ser: 3.36 mg/dL — ABNORMAL HIGH (ref 0.61–1.24)
GFR calc Af Amer: 20 mL/min — ABNORMAL LOW (ref >60)
GFR calc non Af Amer: 17 mL/min — ABNORMAL LOW (ref >60)
GLUCOSE: 126 mg/dL — AB (ref 70–99)
POTASSIUM: 3.8 mmol/L (ref 3.5–5.1)
Phosphorus: 3.3 mg/dL (ref 2.5–4.6)
Sodium: 135 mmol/L (ref 135–145)

## 2018-07-01 LAB — GLUCOSE, CAPILLARY: Glucose-Capillary: 153 mg/dL — ABNORMAL HIGH (ref 70–99)

## 2018-07-01 LAB — PROTIME-INR
INR: 1.77
Prothrombin Time: 20.5 seconds — ABNORMAL HIGH (ref 11.4–15.2)

## 2018-07-01 LAB — HEPARIN LEVEL (UNFRACTIONATED): HEPARIN UNFRACTIONATED: 0.2 [IU]/mL — AB (ref 0.30–0.70)

## 2018-07-01 LAB — LACTATE DEHYDROGENASE: LDH: 193 U/L — AB (ref 98–192)

## 2018-07-01 MED ORDER — CARVEDILOL 6.25 MG PO TABS: 6.2500 mg | ORAL_TABLET | Freq: Two times a day (BID) | ORAL | 6 refills | Status: AC

## 2018-07-01 MED ORDER — WARFARIN SODIUM 5 MG PO TABS
5.0000 mg | ORAL_TABLET | Freq: Once | ORAL | Status: DC
  Filled 2018-07-01: qty 1

## 2018-07-01 MED ORDER — SENNOSIDES-DOCUSATE SODIUM 8.6-50 MG PO TABS: 1.0000 | ORAL_TABLET | Freq: Every evening | ORAL | Status: AC | PRN

## 2018-07-01 MED ORDER — DRONABINOL 2.5 MG PO CAPS: 2.5000 mg | ORAL_CAPSULE | Freq: Two times a day (BID) | ORAL | 0 refills | Status: AC

## 2018-07-01 MED ORDER — CALCITRIOL 0.5 MCG PO CAPS
ORAL_CAPSULE | ORAL | Status: AC
  Filled 2018-07-01: qty 1

## 2018-07-01 MED ORDER — DOCUSATE SODIUM 250 MG PO CAPS: 250.0000 mg | ORAL_CAPSULE | Freq: Every day | ORAL | 0 refills | Status: DC | PRN

## 2018-07-01 MED ORDER — SODIUM CHLORIDE 0.9 % IV SOLN: 100.0000 mL | INTRAVENOUS | Status: DC | PRN

## 2018-07-01 MED ORDER — PENTAFLUOROPROP-TETRAFLUOROETH EX AERO: 1.0000 "application " | INHALATION_SPRAY | CUTANEOUS | Status: DC | PRN

## 2018-07-01 MED ORDER — LIDOCAINE HCL (PF) 1 % IJ SOLN: 5.0000 mL | INTRAMUSCULAR | Status: DC | PRN

## 2018-07-01 MED ORDER — HEPARIN SODIUM (PORCINE) 1000 UNIT/ML DIALYSIS: 1000.0000 [IU] | INTRAMUSCULAR | Status: DC | PRN

## 2018-07-01 MED ORDER — LIDOCAINE-PRILOCAINE 2.5-2.5 % EX CREA: 1.0000 "application " | TOPICAL_CREAM | CUTANEOUS | Status: DC | PRN

## 2018-07-01 NOTE — Procedures (Signed)
I was present at this dialysis session, have reviewed the session itself and made  appropriate changes  Jannifer Hick MD [place] pager [phone]   07/01/2018, 8:09 AM

## 2018-07-01 NOTE — Progress Notes (Signed)
[facility] Progress Note  Subjective:  Continued diarrhea overnight, increased frequency.  Non bloody, not tarry, just watery.  No fevers, chills, abd pain.  On HD currently - going ok.    Vitals:   07/01/18 0647 07/01/18 0720 07/01/18 0725 07/01/18 0730  BP:  111/89 117/84 112/82  Pulse:  84 84 90  Resp:  15 13 17   Temp:  97.9 F (36.6 C)    TempSrc:  Oral    SpO2:  99%    Weight: 76.7 kg 76.7 kg    Height:        Inpatient medications: . atorvastatin  40 mg Oral q1800  . budesonide (PULMICORT) nebulizer solution  0.25 mg Nebulization BID  . busPIRone  5 mg Oral BID  . calcitRIOL  0.5 mcg Oral Q M,W,F-HD  . carvedilol  6.25 mg Oral BID WC  . Chlorhexidine Gluconate Cloth  6 each Topical Q0600  . Chlorhexidine Gluconate Cloth  6 each Topical Q0600  . docusate sodium  200 mg Oral Daily  . dronabinol  2.5 mg Oral BID AC  . insulin aspart  0-5 Units Subcutaneous QHS  . insulin aspart  0-9 Units Subcutaneous TID WC  . insulin glargine  4 Units Subcutaneous QHS  . insulin glargine  6 Units Subcutaneous Daily  . levothyroxine  25 mcg Oral QAC breakfast  . megestrol  40 mg Oral Daily  . multivitamin  1 tablet Oral QHS  . pantoprazole  40 mg Oral Daily  . senna-docusate  1 tablet Oral BID  . sildenafil  20 mg Oral TID  . traZODone  100 mg Oral QHS  . Warfarin - Pharmacist Dosing Inpatient   Does not apply q1800   . sodium chloride    . sodium chloride    . heparin 1,050 Units/hr (07/01/18 0500)   sodium chloride, sodium chloride, acetaminophen, albuterol, benzonatate, calcium carbonate, heparin, lidocaine (PF), lidocaine-prilocaine, LORazepam, ondansetron (ZOFRAN) IV, pentafluoroprop-tetrafluoroeth, polyethylene glycol, traMADol  Iron/TIBC/Ferritin/ %Sat    Component Value Date/Time   IRON 125 06/14/2017 1605   TIBC 221 (L) 06/14/2017 1605   FERRITIN 622 (H) 06/14/2017 1605   IRONPCTSAT 57 (H) 06/14/2017 1605    Exam: Genalert, not in distress,  calm No jvd or bruits Chest clear ant, normal WOB Cor LVAD hum Abd soft ntnd  Ext trace edema Neuro is alert, Ox 3 , nf   Home meds: - atorvastatin 40 hs// levothyroxine 25 ug / megestrol 40 / pantoprazole 40 qd  - trazodone 100 mg hs/ tramadol 50 qid prn/ lorazepam 0.5 tid prn/ buspirone 5 bid - sildenafil 20 mg tid - insulin glargine 4u am and 6u pm  - warfarin 5 mg qd  - albuterol prn / flovent prn/  fluticasone 2 puff bid  - other prn's/ vitamins  Dialysis:MWFSat [facility] 4.5h 73.5kg LUA AVF Hep none - mircera 225 last on 8/5  - calc 0.5ug tiw  CXR 8/19 > clear, no acute   Impression/Plan: 1 ESRD - MWFSat HD. HD today.  Pre wt 76.7, EDW 73.5kg.  UFG 3.7.  Exploring option of home hemo -- current plan is for he and sister to go to [facility] home training unit [facility]) for tour, education and Q&A so they can make an informed decision regarding this option.  This will be post hospital discharge -- possibly tomorrow 8/29 at 9:30 if he is not still hospitalized.  In the meantime he will continue in center HD at [facility] unit.  2  Anemia of CKD - aranesp 165mcg 8/27 (last mircera 268mcg was 8/5) 3 ICM/ sp LVAD 4 CAD hx CABG 5 AS sp TAVRS 6 DM2 7  Hx recurrent GIB - stable Hb,  no signs of bleeding - per above ESA yesterday 8  Nutrition - does not accept renal diet.   9  Volume - euvolemic currently, continue 4x/wk HD 10 Diarrhea - defer additional evaluation to primary.  Jannifer Hick MD [facility] pager [phone]   07/01/2018, 8:03 AM   Recent Labs  Lab 06/29/18 0730  06/30/18 0258 07/01/18 0500  NA 134*  --  136  --   K 4.2  --  3.7  --   CL 101  --  101  --   CO2 25  --  26  --   GLUCOSE 134*  --  215*  --   BUN 36*  --  22  --   CREATININE 3.84*  --  2.45*  --   CALCIUM 8.5*  --  8.2*  --   PHOS 3.5  --   --   --   ALBUMIN 3.1*  --   --   --   INR  --    < > 1.52 1.77   < > = values in this interval not  displayed.   No results for input(s): AST, ALT, ALKPHOS, BILITOT, PROT in the last 168 hours. Recent Labs  Lab 06/29/18 0730 06/30/18 0258  WBC 7.0 6.5  HGB 8.9* 8.4*  HCT 29.6* 27.9*  MCV 95.2 95.5  PLT 136* 142*

## 2018-07-01 NOTE — Progress Notes (Addendum)
LVAD Coordinator Rounding Note:  HM II LVAD implanted on 12/13/16 by Dr. Darcey Nora under Destination Therapy criteria due to age excluding heart transplantation.  Pt admitted 06/22/18 for nausea and vomiting.    Pt asleep in bed this morning. States that he had a good breakfast this morning. Reports 2 very soft (not liquid) bowel movements overnight.    Vital signs: Temp:  97.9 HR: 83- afib  Automatic BP: 92/71 (79) Doppler: 92 O2 Sat: 95 % RA  Wt: 78.2>74.8>72.7>72.1>75.1>82.5kg  LVAD interrogation reveals:  Speed:  9200 Flow:  5.5 Power: 5.7w PI: 5.8 Alarms: none Events: none  Fixed speed: 9200 Low speed limit: 8600   Drive Line: Existing VAD dressing clean, dry, intact. Due to be changed 07/07/18.   Labs:  LDH trend:  251>217>205>203>191>193>194>198>198  INR trend: 1.73>1.96>1.91>1.85>2.15>1.92>1.46>1.52>1.52  Anticoagulation Plan: - INR Goal: 1.8-2.3 - ASA Dose: none due to hx of GI bleed - OP monthly Octreotide for hx of GI bleed  Blood Products:  None this admission  Device: - Medtronic single lead -Therapies: on 231 bpm  Arrythmias: chronic afib  Renal:  - chronic HD on M/W/F/Sat at [facility]  Adverse Events on VAD: - 02/2017> GIB- AVM clipped x2 - 07/2017> renal failure- HD started - 11/2017>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 Margins not clear at neck. - 02/23/18> hospitalization for GI bleed. Colon/EGD 02/27/18 with normal EGD. Colonoscopy identified active bleeding in ileum; source not found. Capsule Endoscopy 02/27/18: Showed active bleeding in the ileum. Received 5 units blood with histamine reaction; will give benadryl as OP. - 03/10/18>transferrred to [facility] for double retro balloon scop and sent to IR emolization x 3 with no evidence of bleeding. Received 12 units PCs, remained off anticoagulants. Transferred back to [facility] 03/20/18 Seen by IR 5/20. CTA obtained, but no active bleeding found. Started on  octreotide. 03/25/18: Repeat capsule endoscopy completed. Bleeding appears to come from the mid-ileum. 03/26/18: General surgery reconsulted. Recommended transfer back to [facility] for surgery with intraoperative endoscopy. [name] currently discussing options. Ortho consulted 5/24 for left knee pain. S/p knee aspiration and steroid injection.  Dose of premarin given 5/24.  On 5/25 developed abdominal bloating and distension with n/v. KUB suggestive of ileus versus early SBO. NGT placed for decompression.  Had large bloody BM after this and felt much better, diet now advanced to regular.  - 04/01/18 transferred to [facility] for  laparoscopic surgical enteroscopy for recurrent GI bleed   Plan/Recommendations: 1.  Weekly dressing changes per bedside nurse. Next dressing change due 07/07/18. 2.  Call VAD pager if any questions re: VAD equipment or drive line site issues.  3. Possibly discharge home today.   Emerson Monte RN, VAD Coordinator 24/7 VAD pager: [phone]

## 2018-07-01 NOTE — Progress Notes (Signed)
IV removed; educated on d/c instructions; belongings collected; emergency equipment with pt; will wheel out once ride here.   [place]  Akashdeep Chuba, RN

## 2018-07-01 NOTE — Progress Notes (Addendum)
Patient ID: Trevor [name], male   DOB: May 03, 1946, 72 y.o.   MRN: [id number]   Advanced Heart Failure VAD Team Note  PCP-Cardiologist: No primary care provider on file.   Subjective:    Denies SOB or CP. Had 4 episodes of diarrhea overnight. Currently in HD and tolerating well. Can do tour of home training unit as early as tomorrow morning per renal note.   LVAD Interrogation HM 3: Speed: 9200 Flow: 5.1  PI: 6.8 Power: 5.5.  Personally reviewed in HD. Unable to assess PI events.   Objective:    Vital Signs:   Temp:  [97.9 F (36.6 C)-98.7 F (37.1 C)] 97.9 F (36.6 C) (08/28 0720) Pulse Rate:  [75-90] 85 (08/28 1000) Resp:  [13-24] 15 (08/28 0900) BP: (90-121)/(58-94) 118/85 (08/28 1000) SpO2:  [95 %-99 %] 99 % (08/28 0720) Weight:  [76.7 kg] 76.7 kg (08/28 0720) Last BM Date: 06/30/18 Mean arterial Pressure 100s (seems to be modified systolic)  Intake/Output:   Intake/Output Summary (Last 24 hours) at 07/01/2018 1025 Last data filed at 07/01/2018 0500 Gross per 24 hour  Intake 220.42 ml  Output 225 ml  Net -4.58 ml     Physical Exam    Physical Exam: General: Well appearing this am. NAD.  HEENT: Normal. Neck: Supple, JVP 7-8 cm. Carotids OK.  Cardiac:  Mechanical heart sounds with LVAD hum present.  Lungs:  CTAB, normal effort.  Abdomen:  NT, ND, no HSM. No bruits or masses. +BS  LVAD exit site: . Dressing dry and intact. No erythema or drainage. Stabilization device present and accurately applied.  Extremities:  Warm and dry. No cyanosis, clubbing, rash, or edema.  Neuro:  Alert & oriented x 3. Cranial nerves grossly intact. Moves all 4 extremities w/o difficulty. Affect pleasant     Telemetry   Atrial fibrillation 70-80s. Personally reviewed.   EKG    No new tracings.   Labs   Basic Metabolic Panel: Recent Labs  Lab 06/24/18 1823  06/25/18 0248 06/26/18 0219 06/27/18 1133 06/29/18 0730 06/30/18 0258 07/01/18 0729  NA  --    < > 134* 134* 135  134* 136 135  K  --    < > 3.8 3.7 4.1 4.2 3.7 3.8  CL  --    < > 97* 98 99 101 101 100  CO2  --    < > 29 27 29 25 26 25   GLUCOSE  --    < > 208* 171* 167* 134* 215* 126*  BUN  --    < > 20 28* 19 36* 22 30*  CREATININE  --    < > 3.02* 3.73* 2.89* 3.84* 2.45* 3.36*  CALCIUM  --    < > 8.2* 8.1* 8.5* 8.5* 8.2* 8.5*  MG 1.8  --  1.7  --   --   --   --   --   PHOS  --   --   --   --   --  3.5  --  3.3   < > = values in this interval not displayed.    Liver Function Tests: Recent Labs  Lab 06/29/18 0730 07/01/18 0729  ALBUMIN 3.1* 3.1*   No results for input(s): LIPASE, AMYLASE in the last 168 hours. No results for input(s): AMMONIA in the last 168 hours.  CBC: Recent Labs  Lab 06/26/18 0219 06/27/18 1133 06/29/18 0730 06/30/18 0258 07/01/18 0500  WBC 6.9 7.6 7.0 6.5 6.8  HGB 8.7* 8.8* 8.9*  8.4* 8.6*  HCT 29.0* 29.9* 29.6* 27.9* 29.1*  MCV 93.9 95.2 95.2 95.5 96.0  PLT 142* 142* 136* 142* 139*    INR: Recent Labs  Lab 06/27/18 1133 06/28/18 0308 06/29/18 0757 06/30/18 0258 07/01/18 0500  INR 1.64 1.48 1.46 1.52 1.77    Imaging   No results found.   Medications:     Scheduled Medications: . atorvastatin  40 mg Oral q1800  . budesonide (PULMICORT) nebulizer solution  0.25 mg Nebulization BID  . busPIRone  5 mg Oral BID  . calcitRIOL  0.5 mcg Oral Q M,W,F-HD  . carvedilol  6.25 mg Oral BID WC  . Chlorhexidine Gluconate Cloth  6 each Topical Q0600  . Chlorhexidine Gluconate Cloth  6 each Topical Q0600  . docusate sodium  200 mg Oral Daily  . dronabinol  2.5 mg Oral BID AC  . insulin aspart  0-5 Units Subcutaneous QHS  . insulin aspart  0-9 Units Subcutaneous TID WC  . insulin glargine  4 Units Subcutaneous QHS  . insulin glargine  6 Units Subcutaneous Daily  . levothyroxine  25 mcg Oral QAC breakfast  . megestrol  40 mg Oral Daily  . multivitamin  1 tablet Oral QHS  . pantoprazole  40 mg Oral Daily  . senna-docusate  1 tablet Oral BID  .  sildenafil  20 mg Oral TID  . traZODone  100 mg Oral QHS  . Warfarin - Pharmacist Dosing Inpatient   Does not apply q1800    Infusions: . sodium chloride    . sodium chloride    . heparin 1,050 Units/hr (07/01/18 0930)    PRN Medications: sodium chloride, sodium chloride, acetaminophen, albuterol, benzonatate, calcium carbonate, heparin, lidocaine (PF), lidocaine-prilocaine, LORazepam, ondansetron (ZOFRAN) IV, pentafluoroprop-tetrafluoroeth, polyethylene glycol, traMADol   Patient Profile   Trevor [name] is a 72 y.o. 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   Admitted 06/22/18 with abdominal pain, nausea, and vomiting.   Assessment/Plan:    1. Abdominal Pain/n/v: Resolved. Non-obstructive pattern on KUB. Large stool burden. Improved with enemas.  2. Chronic systolic CHF: Ischemic cardiomyopathy.  EF 20-25% with RV dysfunction on 2/18 echo pre-LVAD. 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 Medtronic ICD. He has had problems with RV failure post-op. He developed worsening renal dysfunction and finally had to start HD in 8/18. There has been some difficulty getting adequate fluid off him, currently dialyzing 4 days/week.  - Volume managed per HD.  - INR 1.77 Continue warfarin with INR goal 1.8 - 2.3, will continue heparin gtt until INR 1.8. No ASA with GI bleeding. Probably okay for DC today.  3. CAD: s/p CABG. No CP.   - He is on atorvastatin.  4. ESRD: Ongoing HD 4 days/week, see above.Nephrology  following. Tolerating in-house HD fairly well.  Says he can tolerate better due to lower volume removal and fact that he can lie in bed during HD.  - Plan for trial of home HD, will need education.  Per Dr  Deterding ok with home HD.  He will have further education at HD unit after discharge (had initial discussion yesterday).  If not, will need to be seen at [facility] for PD evaluation. HD  today.  5. Atrial fibrillation: Chronic. On warfarin. Pharm D dosing.   - Continue heparin gtt until INR 1.8.  - Rate controlled with low dose BB. No change 6. RV failure:  - Continue Revatio at 40 mg TID. No change.  7. HTN: MAP stable.  - Continue carvedilol. Can add amlodipine or hydralazine as needed.  8. 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 Hgb 8.6 No overt bleeding.   9. GI bleeding: GI AVMs. Now s/p laparoscopic surgical enteroscopy with 4 clips on 6/7/19at [name], c/b ischemic bowel/SBO. Hgb 8.4. Stable.  No overt bleeding.   - On heparin drip until INR 1.8 or greater. Goal INR 1.8-2.3, adjust to keep in this range.  - Getting Aranesp per nephrology.  - Continue octreotide as outpatient. No change.  10. Squamous cell skin cancer: Neck excision site without clear margins. He saw Dr Lisbeth Renshaw with radiation oncology, radiation was recommended. He wants to hold off for the time being, does not think he can handle radiation as HD wears him out 4 days/week. He now has a nodule concerning for skin cancer on his forehead, has been referred back to dermatology. NO change.  11. H/o TAVR: Stable on last echo. NO change.  12. Septic Left Knee:s/p I&D and surgical wash out at [facility] -Getting PT. No change.  - Follow with ortho.   INR 1.77. Can likely DC after HD today. Will discuss with Dr Aundra Dubin. He has follow up scheduled (dressing change 9/3 and hosp f/u 9/10) in VAD clinic.  I reviewed the LVAD parameters from today, and compared the results to the patient's prior recorded data.  No programming changes were made.  The LVAD is functioning within specified parameters.  The patient performs LVAD self-test daily.  LVAD interrogation was negative for any  significant power changes, alarms or PI events/speed drops.  LVAD equipment check completed and is in good working order.  Back-up equipment present.   LVAD education done on emergency procedures and precautions and reviewed exit site care.   Length of Stay: [place], NP  10:25 AM  VAD Team --- VAD ISSUES ONLY--- Pager [phone] (7am - 7am)  Advanced Heart Failure Team  Pager [phone] (M-F; 7a - 4p)  Please contact [facility] for night-coverage after hours (4p -7a ) and weekends on [email or web address]  Patient seen with NP, agree with the above note.    Main complaint is loose stool.  No blood, WBCs normal, no fever.  Will make sure he is not getting any laxative.    He is tolerating HD ok here.   INR 1.77 today, think he can stop heparin gtt and go home.   He will go to his outpatient dialysis center tomorrow for home HD orientation.  If he ends up not thinking he can do this, will need to get him to [facility] for PD catheter placement evaluation.   Loralie Champagne 07/01/2018 12:07 PM

## 2018-07-01 NOTE — Care Management Note (Signed)
Case Management Note   Original Note by: [name], RN 06/30/2018, 4:49 PM  Patient Details  Name: Trevor [name] MRN: [id number] Date of Birth: September 03, 1946  Subjective/Objective:    Pt admitted with N/V  And abdominal pain - pt has LVAD                Action/Plan:  PTA independent from home with [facility] following for [facility] and PT.  Pt is ESRD on outp HD - being considered for PD at some point in the future.  Agency aware of admit and will resume services at discharge.  CM requested resumption orders.     Expected Discharge Date:  07/01/18               Expected Discharge Plan:  [place]  In-House Referral:  Clinical Social Work  Discharge planning Services  CM Consult  Post Acute Care Choice:  Resumption of Svcs/PTA Provider Choice offered to:  Patient  DME Arranged:    DME Agency:     HH Arranged:  RN, PT [place] Agency:  [place]  Status of Service:  Completed, signed off  If discussed at [place] of Stay Meetings, dates discussed:    Additional Comments Update:  Orders written - [facility] informed of resumption orders  07/01/18 J. Jaisean Monteforte, RN, BSN Pt medically stable for discharge today.  Notified [facility] of dc home; resumption orders received.    Ella Bodo, RN 07/01/2018, 1:59 PM

## 2018-07-01 NOTE — Progress Notes (Signed)
[place] for warfarin / heparin Indication: LVAD  No Known Allergies  Patient Measurements: Height: 5\' 5"  (165.1 cm) Weight: 169 lb 1.5 oz (76.7 kg) IBW/kg (Calculated) : 61.5 Heparin Dosing Weight: 75 kg  Vital Signs: Temp: 97.9 F (36.6 C) (08/28 0720) Temp Source: Oral (08/28 0720) BP: 118/85 (08/28 1000) Pulse Rate: 85 (08/28 1000)  Labs: Recent Labs    06/29/18 0730 06/29/18 0757 06/29/18 1625 06/30/18 0258 07/01/18 0500 07/01/18 0729  HGB 8.9*  --   --  8.4* 8.6*  --   HCT 29.6*  --   --  27.9* 29.1*  --   PLT 136*  --   --  142* 139*  --   LABPROT  --  17.6*  --  18.2* 20.5*  --   INR  --  1.46  --  1.52 1.77  --   HEPARINUNFRC  --  0.15* 0.25* 0.24* 0.20*  --   CREATININE 3.84*  --   --  2.45*  --  3.36*    Estimated Creatinine Clearance: 19 mL/min (A) (by C-G formula based on SCr of 3.36 mg/dL (H)).   Medical History: Past Medical History:  Diagnosis Date  . AICD (automatic cardioverter/defibrillator) present   . Asthma   . AVM (arteriovenous malformation) of colon 07/07/2017  . CHF (congestive heart failure) ([place])   . Diabetes ([place])   . ESRF (end stage renal failure) ([place]) 07/07/2017   pt receiving hemodialysis Monday- Wednesday-friday, sometimes Saturday  . Kidney disease   . LVAD (left ventricular assist device) present ([place]) 12/2016  . Permanent atrial fibrillation ([place]) 07/07/2017  . Presence of permanent cardiac pacemaker    AICD  . Sleep apnea     Assessment: 38 yoM with LVAD and significant hx of GIB presents with abdominal pain and subtherapeutic INR. Pharmacy consulted to resume warfarin. INR fell 1.9>1.6>1.48 despite boosted dose on 8/24.   Heparin level at goal at 0.20, INR subtherapeutic but close to goal at 1.77, CBC/LDH stable. Can likely stop heparin and pt likely to be discharged today - would continue 5mg  daily with INR check early next week.  Goal of Therapy:  INR 1.8-2.3 HL ~0.2 Monitor  platelets by anticoagulation protocol: Yes   Plan:  -Warfarin 5mg  tonight -Continue IV heparin for now, can likely discontinue -If discharged, continue warfarin 5mg  daily until INR check 9/3  Arrie Senate, PharmD, BCPS Clinical Pharmacist [phone] Please check AMION for all [place] numbers 07/01/2018

## 2018-07-02 ENCOUNTER — Telehealth: Payer: Self-pay

## 2018-07-02 ENCOUNTER — Encounter (HOSPITAL_COMMUNITY): Payer: Self-pay | Admitting: Pharmacist

## 2018-07-02 NOTE — Telephone Encounter (Signed)
Pt on TCM after Admitted 06/22/18 for abd pain, n/v. KUB showed large stool burden and abd pain improved with enema. Pt dc instructions: to follow up with cardiology.

## 2018-07-03 ENCOUNTER — Telehealth (HOSPITAL_COMMUNITY): Payer: Self-pay

## 2018-07-03 NOTE — Telephone Encounter (Addendum)
PA has been approved Jul 02, 2018 through Dec 31,2019.  PA # [id number]

## 2018-07-07 ENCOUNTER — Telehealth (HOSPITAL_COMMUNITY): Payer: Self-pay | Admitting: *Deleted

## 2018-07-07 ENCOUNTER — Ambulatory Visit (HOSPITAL_COMMUNITY)
Admission: RE | Admit: 2018-07-07 | Discharge: 2018-07-07 | Disposition: A | Discharge status: Home / Self Care | Payer: Medicare Other | Source: Ambulatory Visit | Attending: Cardiology | Admitting: Cardiology

## 2018-07-07 ENCOUNTER — Ambulatory Visit (HOSPITAL_COMMUNITY): Payer: Self-pay | Admitting: Pharmacist

## 2018-07-07 ENCOUNTER — Other Ambulatory Visit (HOSPITAL_COMMUNITY): Payer: Self-pay | Admitting: *Deleted

## 2018-07-07 DIAGNOSIS — K922 Gastrointestinal hemorrhage, unspecified: Secondary | ICD-10-CM

## 2018-07-07 DIAGNOSIS — Z95811 Presence of heart assist device: Secondary | ICD-10-CM

## 2018-07-07 DIAGNOSIS — Z4801 Encounter for change or removal of surgical wound dressing: Secondary | ICD-10-CM | POA: Diagnosis not present

## 2018-07-07 DIAGNOSIS — K284 Chronic or unspecified gastrojejunal ulcer with hemorrhage: Secondary | ICD-10-CM

## 2018-07-07 DIAGNOSIS — Q2730 Arteriovenous malformation, site unspecified: Secondary | ICD-10-CM

## 2018-07-07 LAB — PROTIME-INR
INR: 1.35
Prothrombin Time: 16.6 seconds — ABNORMAL HIGH (ref 11.4–15.2)

## 2018-07-07 NOTE — Progress Notes (Signed)
Patient presents to clinic today for drive line exit wound care. Existing VAD dressing removed and site care performed using sterile technique. Drive line exit site cleaned with Chlora prep applicators x 2, allowed to dry. Saline wipe to site due to Chlora prep sensitivity. Sorbaview dressing re-applied. Exit site healed and incorporated, the velour is fully implanted at exit site. No redness, tenderness, drainage, foul odor or rash noted. Drive line anchor re-applied. Pt denies fever or chills.   Return in 1 week for another dressing change per standard of care. INR to be repeated in 1-2 weeks pending results per anticoagulation protocol.   Emerson Monte RN [place] Coordinator  Office: [phone]  24/7 Pager: [phone]

## 2018-07-07 NOTE — Addendum Note (Signed)
Addended by: Emerson Monte B on: 07/07/2018 01:58 PM   Modules accepted: Orders

## 2018-07-07 NOTE — Addendum Note (Signed)
Encounter addended by: Mertha Baars, RN on: 07/07/2018 9:32 AM  Actions taken: Sign clinical note

## 2018-07-07 NOTE — Telephone Encounter (Signed)
Spoke with Trevor [name] and let him know that I scheduled his Octreotide injection for Sept 10th at 11:00am. Patient verbalized understanding.   Emerson Monte RN [place] Coordinator  Office: [phone]  24/7 Pager: [phone]

## 2018-07-14 ENCOUNTER — Ambulatory Visit (HOSPITAL_COMMUNITY): Payer: Self-pay | Admitting: Pharmacist

## 2018-07-14 ENCOUNTER — Encounter (HOSPITAL_COMMUNITY)
Admission: RE | Admit: 2018-07-14 | Discharge: 2018-07-14 | Disposition: A | Discharge status: Home / Self Care | Payer: Medicare Other | Source: Ambulatory Visit | Attending: Cardiology | Admitting: Cardiology

## 2018-07-14 ENCOUNTER — Ambulatory Visit (HOSPITAL_COMMUNITY)
Admit: 2018-07-14 | Discharge: 2018-07-14 | Disposition: A | Discharge status: Home / Self Care | Payer: Medicare Other | Source: Ambulatory Visit | Attending: Cardiology | Admitting: Cardiology

## 2018-07-14 ENCOUNTER — Other Ambulatory Visit (HOSPITAL_COMMUNITY): Payer: Self-pay | Admitting: *Deleted

## 2018-07-14 VITALS — BP 127/75 | HR 74 | Ht 65.0 in | Wt 158.6 lb

## 2018-07-14 DIAGNOSIS — J45909 Unspecified asthma, uncomplicated: Secondary | ICD-10-CM | POA: Diagnosis not present

## 2018-07-14 DIAGNOSIS — G8929 Other chronic pain: Secondary | ICD-10-CM | POA: Diagnosis not present

## 2018-07-14 DIAGNOSIS — I5043 Acute on chronic combined systolic (congestive) and diastolic (congestive) heart failure: Secondary | ICD-10-CM

## 2018-07-14 DIAGNOSIS — I5022 Chronic systolic (congestive) heart failure: Secondary | ICD-10-CM | POA: Insufficient documentation

## 2018-07-14 DIAGNOSIS — K922 Gastrointestinal hemorrhage, unspecified: Secondary | ICD-10-CM

## 2018-07-14 DIAGNOSIS — G4733 Obstructive sleep apnea (adult) (pediatric): Secondary | ICD-10-CM | POA: Insufficient documentation

## 2018-07-14 DIAGNOSIS — R110 Nausea: Secondary | ICD-10-CM

## 2018-07-14 DIAGNOSIS — Z79899 Other long term (current) drug therapy: Secondary | ICD-10-CM | POA: Insufficient documentation

## 2018-07-14 DIAGNOSIS — Z95811 Presence of heart assist device: Secondary | ICD-10-CM

## 2018-07-14 DIAGNOSIS — I255 Ischemic cardiomyopathy: Secondary | ICD-10-CM | POA: Diagnosis not present

## 2018-07-14 DIAGNOSIS — I132 Hypertensive heart and chronic kidney disease with heart failure and with stage 5 chronic kidney disease, or end stage renal disease: Secondary | ICD-10-CM | POA: Diagnosis not present

## 2018-07-14 DIAGNOSIS — Z7901 Long term (current) use of anticoagulants: Secondary | ICD-10-CM

## 2018-07-14 DIAGNOSIS — L409 Psoriasis, unspecified: Secondary | ICD-10-CM | POA: Insufficient documentation

## 2018-07-14 DIAGNOSIS — Z794 Long term (current) use of insulin: Secondary | ICD-10-CM | POA: Insufficient documentation

## 2018-07-14 DIAGNOSIS — I2510 Atherosclerotic heart disease of native coronary artery without angina pectoris: Secondary | ICD-10-CM | POA: Insufficient documentation

## 2018-07-14 DIAGNOSIS — Z79890 Hormone replacement therapy (postmenopausal): Secondary | ICD-10-CM | POA: Insufficient documentation

## 2018-07-14 DIAGNOSIS — C4442 Squamous cell carcinoma of skin of scalp and neck: Secondary | ICD-10-CM | POA: Insufficient documentation

## 2018-07-14 DIAGNOSIS — I482 Chronic atrial fibrillation: Secondary | ICD-10-CM | POA: Diagnosis not present

## 2018-07-14 DIAGNOSIS — Z952 Presence of prosthetic heart valve: Secondary | ICD-10-CM | POA: Diagnosis not present

## 2018-07-14 DIAGNOSIS — K284 Chronic or unspecified gastrojejunal ulcer with hemorrhage: Secondary | ICD-10-CM | POA: Insufficient documentation

## 2018-07-14 DIAGNOSIS — Z992 Dependence on renal dialysis: Secondary | ICD-10-CM | POA: Insufficient documentation

## 2018-07-14 DIAGNOSIS — Z951 Presence of aortocoronary bypass graft: Secondary | ICD-10-CM | POA: Diagnosis not present

## 2018-07-14 DIAGNOSIS — D631 Anemia in chronic kidney disease: Secondary | ICD-10-CM | POA: Diagnosis not present

## 2018-07-14 DIAGNOSIS — Q2730 Arteriovenous malformation, site unspecified: Secondary | ICD-10-CM | POA: Diagnosis not present

## 2018-07-14 DIAGNOSIS — C801 Malignant (primary) neoplasm, unspecified: Secondary | ICD-10-CM

## 2018-07-14 DIAGNOSIS — N186 End stage renal disease: Secondary | ICD-10-CM | POA: Insufficient documentation

## 2018-07-14 DIAGNOSIS — C44722 Squamous cell carcinoma of skin of right lower limb, including hip: Secondary | ICD-10-CM | POA: Diagnosis not present

## 2018-07-14 DIAGNOSIS — E1122 Type 2 diabetes mellitus with diabetic chronic kidney disease: Secondary | ICD-10-CM | POA: Diagnosis not present

## 2018-07-14 DIAGNOSIS — Z8249 Family history of ischemic heart disease and other diseases of the circulatory system: Secondary | ICD-10-CM | POA: Insufficient documentation

## 2018-07-14 DIAGNOSIS — Z7951 Long term (current) use of inhaled steroids: Secondary | ICD-10-CM | POA: Diagnosis not present

## 2018-07-14 LAB — BASIC METABOLIC PANEL
Anion gap: 12 (ref 5–15)
BUN: 25 mg/dL — AB (ref 8–23)
CALCIUM: 9.1 mg/dL (ref 8.9–10.3)
CO2: 26 mmol/L (ref 22–32)
Chloride: 98 mmol/L (ref 98–111)
Creatinine, Ser: 2.92 mg/dL — ABNORMAL HIGH (ref 0.61–1.24)
GFR calc Af Amer: 23 mL/min — ABNORMAL LOW (ref >60)
GFR calc non Af Amer: 20 mL/min — ABNORMAL LOW (ref >60)
Glucose, Bld: 153 mg/dL — ABNORMAL HIGH (ref 70–99)
Potassium: 3.8 mmol/L (ref 3.5–5.1)
SODIUM: 136 mmol/L (ref 135–145)

## 2018-07-14 LAB — CBC
HEMATOCRIT: 31.2 % — AB (ref 39.0–52.0)
Hemoglobin: 9.6 g/dL — ABNORMAL LOW (ref 13.0–17.0)
MCH: 29.4 pg (ref 26.0–34.0)
MCHC: 30.8 g/dL (ref 30.0–36.0)
MCV: 95.7 fL (ref 78.0–100.0)
Platelets: 126 10*3/uL — ABNORMAL LOW (ref 150–400)
RBC: 3.26 MIL/uL — ABNORMAL LOW (ref 4.22–5.81)
RDW: 17.7 % — ABNORMAL HIGH (ref 11.5–15.5)
WBC: 8.3 10*3/uL (ref 4.0–10.5)

## 2018-07-14 LAB — LACTATE DEHYDROGENASE: LDH: 246 U/L — ABNORMAL HIGH (ref 98–192)

## 2018-07-14 LAB — PROTIME-INR
INR: 1.56
Prothrombin Time: 18.5 seconds — ABNORMAL HIGH (ref 11.4–15.2)

## 2018-07-14 MED ORDER — OCTREOTIDE ACETATE 20 MG IM KIT
20.0000 mg | PACK | INTRAMUSCULAR | Status: DC
  Administered 2018-07-14: 20 mg via INTRAMUSCULAR
  Filled 2018-07-14 (×2): qty 1

## 2018-07-14 MED ORDER — OCTREOTIDE ACETATE 20 MG IM KIT: 20.0000 mg | PACK | Freq: Once | INTRAMUSCULAR | Status: DC

## 2018-07-14 MED ORDER — LORAZEPAM 0.5 MG PO TABS: 0.5000 mg | ORAL_TABLET | Freq: Three times a day (TID) | ORAL | 3 refills | Status: AC | PRN

## 2018-07-14 MED ORDER — TRAMADOL HCL 50 MG PO TABS: 50.0000 mg | ORAL_TABLET | Freq: Four times a day (QID) | ORAL | 3 refills | Status: AC | PRN

## 2018-07-14 NOTE — Progress Notes (Addendum)
Patient presents for hospital follow up in [facility] today. Pt drove himself, he is walking with roller walker today. Reports no problems with VAD equipment or concerns with drive line.  Pt reports he has been tolerating 4 day/week dialysis without any acute dyspnea. Pt says on the days he has dialysis he goes home, eats, and then goes to bed. He as learned "not to overdo it" on these days. He is taking Trevor [name] prn and Trevor [name] prn and is sleeping better and having less nausea.   Pt reports home HD is not an option for him due to needing 7/day week caregiver to assist him. His sister is not up to the task due to her medical issues.   Reports poor appetite with weight loss. Is taking Trevor [name], not sure about Trevor [name], says he will check on medication at home. Pt requesting refills on Trevor [name] and Trevor [name] - reviewed with [name], written refills provided.    Pt has not heard from Dermatology for f/u for skin cancer on his forehead. Reached out to Dr. Eusebio [name] office and scheduled appointment on 08/06/18.  Took patient to admissions at completion of clinic to register for [facility] Octreotide injection scheduled today.  Vital Signs:  Doppler Pressure: 50 Automatc BP: 127/75 (94) HR:  74 SPO2: 97%  Weight: 158.6 lb w/o eqt Last weight: 163 lb lb  Pt reports weight yesterday:   Pre dialysis:  76.0 kg  Post dialysis:  73.7 kg New dry weight goal: 74 kg   VAD Indication:  Destination Therapy-dialysis   VAD interrogation & Equipment Management: Speed: 9200 Flow: 5.5 Power: 5.5 w    PI: 4.5  Alarms: none Events: 0 - 30 daily  Fixed speed 9200 Low speed limit: 8600  Primary Controller:  Replace back up battery in 28 months. Back up controller:   Replace back up battery in 28 months.  Annual Equipment Maintenance on UBC/PM was performed on 12/2017.   I reviewed the LVAD parameters from today and compared the results to the patient's prior recorded data. LVAD interrogation  was NEGATIVE for significant power changes, NEGATIVE for clinical alarms and STABLE for PI events/speed drops. No programming changes were made and pump is functioning within specified parameters. Pt is performing daily controller and system monitor self tests along with completing weekly and monthly maintenance for LVAD equipment.  LVAD equipment check completed and is in good working order. Back-up equipment present.    Exit Site Care: Drive line is being maintained weekly by VAD coordinators. Drive line exit site well healed and incorporated. The velour is fully implanted at exit site. Existing dressing removed, exit site with no tenderness, drainage, or foul odor noted; slight redness under DL at exit site, placed small sterile gauze under DL, no bio patch. Cleaned with Chlora prep applicators x 2 and rinsed with sterile saline. Site covered with large tegaderm, anchor replaced. Pt denies fever or chills.    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 Margins not clear at neck. - 02/23/18> hospitalization for GI bleed. Colon/EGD 02/27/18 with normal EGD. Colonoscopy identified active bleeding in ileum; source not found. Capsule Endoscopy 02/27/18: Showed active bleeding in the ileum. Received 5 units blood with histamine reaction; will give benadryl as OP. - 03/10/18>transferrred to [facility] for double retro balloon scop and sent to IR emolization x 3 with no evidence of bleeding.  Received 12 units PCs, remained off anticoagulants. Transferred back to [facility] 03/20/18 Seen by IR 5/20. CTA obtained, but no active bleeding found. Started on octreotide. 03/25/18: Repeat capsule endoscopy completed. Bleeding appears to come from the mid-ileum. 03/26/18: General surgery reconsulted. Recommended transfer back to [facility] for surgery with intraoperative endoscopy. [name] currently discussing  options. Ortho consulted 5/24 for left knee pain. S/p knee aspiration and steroid injection.  Dose of premarin given 5/24.  On 5/25 developed abdominalbloating and distension with n/v. KUB suggestive of ileus versus early SBO. NGT placed for decompression. Had large bloody BM after this and felt much better, diet now advanced to regular.  - 04/01/18 transferred to [facility] for  laparoscopic surgical enteroscopy for recurrent GI bleed  Device: Medtronic SL  Therapy: on 231 bpm; monitor on 150 bpm Pacing: VVI 40  BP & Labs: MAP 50 - Doppler is reflecting MAP  Hgb 9.6 - No S/S of bleeding. Specifically denies melena/BRBPR or nosebleeds.  LDH stable at 246 with established baseline of 200-280. Denies tea-colored urine. No power elevations noted on interrogation.   Patient Instructions: 1. No change in meds.  2. F/U with Dr. Marla [name] on 08/06/18 for skin cancer on forehead. 3. Trevor [name] PharmD will f/u with you on your INR and coumadin dosing.  4. Return to [facility] in 2 months. 5. Return in one week for INR and dressing change.   Trevor Girt RN VAD Coordinator   Office: [phone] 24/7 Emergency VAD Pager: [phone]     VAD Clinic Note   Trevor [name] is a 72 y.o. male with a history of CAD s/p CABG x 4 2010, OSA, AS with TAVR 2015, CKD, DM2, paroxysmal atrial fibrillation, asthma, and ischemic cardiomyopathy with Medtronic ICD.   Relocated to [place] from [place] in early 2018. Over the last couple of years, he had been admitted multiple times for CHF. Last admission in [place] was in 1/18. Sounds like the hospitalization was complicated by cardiorenal syndrome and there was consideration of doing dialysis.  He was admitted in to [facility] 11/27/16 with NYHA class IV symptoms and hypotension. He was placed on dual inotropes to facilitate diuresis, cardiac output and renal function.  CT surgery consulted for mechanical support and he was deemed appropriate for  LVAD work up. He completed LVAD work up and was approved for HMII LVAD --> DT. On 12/11/16, he had IABP placed to optimize cardiac output and improve renal function. He then underwent HMII LVAD placement + tricuspid valve repair on 12/14/2015. Post operatively pressors weaned off slowly. He required significant diuresis.  Course was complicated by RV dysfunction and renal dysfunction.  Patient was admitted with lower GI bleeding in 4/18.  He had been on ASA 325 (increased with mild LDH elevation) and warfarin INR 2-2.5.  He had 4 units PRBCs total.  Colonoscopy showed colonic AVM treated with APC and clipping.  He was sent home off ASA and on warfarin INR goal 2-2.5.   In 5/18, creatinine was noted to increase as high as 3.67.  I cut back on his diuretics but he became volume overloaded.  I increased diuretics again with poor response.  In 5/18, he had RHC showing elevated right and left heart filling pressures and preserved cardiac output. I admitted him for IV diuresis.  Weight and creatinine came down, creatinine was 2.9 when discharged.  Ramp echo was done and speed was increased to 9200 rpm.  Sildenafil was increased to 40 mg tid.  At the next visit, speed was increased to 9400 rpm and torsemide was decreased to 40 mg daily, later increased back to 60 mg daily with increased volume.   He continued to have problems with up and down renal function, requiring up and down adjustment of torsemide.  He finally was admitted to the hospital in 8/18 with intractable volume overload.  Efforts at diuresis were complicated by AKI.  Speed was increased to 9600 rpm in the hospital. He finally had to undergo CVVH, then was started on intermittent HD.  He was discharged home to continue on HD.    He was admitted in 10/18 for inpatient HD due to excessive weight gain.  He is now going to HD 4 times/week.    In 1/19, he was admitted for skin cancer removal.  He had a squamous cell skin cancer removed from his left neck,  margins not clear.  He had a squamous cell skin cancer removed from the right leg, margins were clear.  This admission complicated by hematoma formation at neck site requiring evacuation.  His right leg site appeared to be infected and he had a course of vancomycin.   Pt had a prolonged admission 03/2018 to 05/2018 for GI bleeding. Transferred to [facility] for surgical treatment. 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   He has now had several admissions with dyspnea after HD.  He has been volume overloaded and has required dialysis in the hospital. Given abdominal surgeries, he is not a good candidate for PD.  He has been thinking about home HD. It sounds like this may not be an option as his sister cannot be there every day to help him.   He returns today for followup of LVAD and CHF.  He actually seems to be doing better with HD.  His dry weight has been lowered, and cough has resolved.  He is not getting short of breath after HD. He is trying to take more easy after HD => goes home, lies down for the rest of day.  This seems to help.  He is also using Xanax for anxiety.  No BRBPR/melena.  No lightheaded spells.   Labs (3/18): LDH 331 => 401 => 443 => 366 => 313, K 3.6 => 3.2 => 3.5, creatinine 1.98 => 1.74 => 1.8 => 1.79, hgb 8.3 => 8.9 => 8.7 => 8.4, INR 2.11 => 2.4 Labs (4/18): hgb 9.4 Labs (5/18): LDH 269 => 249, INR 2.57, K 4.5, creatinine 1.92 => 2.71 => 3.67 => 3.43 => 3.1 => 2.93, hgb 9, plts 104 Labs (6/18): K 4.5, creatinine 3.6 => 3.43, hgb 8.5, INR 2.4, LDH 287\ Labs (7/18): K 4.5, creatinine 3.52 => 4, hgb 7.9 => 8.5 Labs (8/18): K 3.8, creatinine 4, hgb 8.5 Labs (9/18): hgb 8.2 => 9 => 9.9 => 10.6, plts 143, LDH 205 => 251 Labs (4/19): hgb 10, LDH 267 Labs (8/19): hgb 10.1 => 9.6, WBC 14  LVAD parameters: Please see nurses note above. I personally reviewed  along with them.   PMH: 1. CAD: s/p CABG in 2010.  2. Aortic stenosis: s/p TAVR in 2015. Valve looked ok on 2/18 echo.  3. Atrial fibrillation: Chronic.  4. Type II diabetes.  5. ESRD  6. Chronic systolic CHF: Ischemic cardiomyopathy with prominent RV dysfunction.  Medtronic ICD.  - Echo (2/18): EF 20-25%,  moderately dilated LV, moderately dilated/moderately dysfunctional RV, severe TR.  - Cardiogenic shock 2/18 requiring milrinone, norepinephrine, and IABP.  - Heartmate II LVAD for DT placed 12/13/16.   - RHC (5/18): mean RA 16, PA 55/22 mean 33, mean PCWP 23, CI 2.59, PVR 3.87 - RHC (8/18): mean RA 17, PA 56/25 mean 37, mean PCWP 20, CI 3.2 Fick/2.1 thermo 7. OSA.  8. Thrombocytopenia: Post-op LVAD, resolved.  9. Tricuspid regurgitation: Severe, s/p repair with LVAD placement in 2/18.  10. Lower GI bleed (4/18): Colonic AVM, treated with APC and clipping.  11. Psoriasis 12. Squamous cell skin cancer: left neck, right leg => removed 1/19.  Margins not clear at neck. Radiation recommended but so far he wants to hold off.   Social History   Socioeconomic History  . Marital status: Widowed    Spouse name: Not on file  . Number of children: 0  . Years of education: 51  . Highest education level: Not on file  Occupational History  . Not on file  Social Needs  . Financial resource strain: Not on file  . Food insecurity:    Worry: Not on file    Inability: Not on file  . Transportation needs:    Medical: Not on file    Non-medical: Not on file  Tobacco Use  . Smoking status: Never Smoker  . Smokeless tobacco: Never Used  Substance and Sexual Activity  . Alcohol use: No  . Drug use: No  . Sexual activity: Not Currently  Lifestyle  . Physical activity:    Days per week: Not on file    Minutes per session: Not on file  . Stress: Not on file  Relationships  . Social connections:    Talks on phone: Not on file    Gets together: Not on file    Attends religious service: Not  on file    Active member of club or organization: Not on file    Attends meetings of clubs or organizations: Not on file    Relationship status: Not on file  . Intimate partner violence:    Fear of current or ex partner: Not on file    Emotionally abused: Not on file    Physically abused: Not on file    Forced sexual activity: Not on file  Other Topics Concern  . Not on file  Social History Narrative   Patient is a widow. Moved to [place] from [place], [place]. Currently lives with his sister Trevor [name].    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    Family History  Problem Relation Age of Onset  . Heart failure Father   . Heart attack Father   . Healthy Mother   . Diabetes Paternal Grandfather    Review of systems complete and found to be negative unless listed in HPI.    Current Outpatient Medications  Medication Sig Dispense Refill  . acetaminophen (TYLENOL) 325 MG tablet Take 2 tablets (650 mg total) by mouth every 4 (four) hours as needed for headache or mild pain.    Marland Kitchen albuterol (PROVENTIL HFA;VENTOLIN HFA) 108 (90 Base) MCG/ACT inhaler Inhale 2 puffs into the lungs every 4 (four) hours as needed for wheezing or shortness of breath. 3 Inhaler 5  . albuterol (PROVENTIL) (2.5 MG/3ML) 0.083% nebulizer solution Take 2.5 mg by nebulization every 4 (four) hours as needed for wheezing or shortness of breath.    Marland Kitchen atorvastatin (LIPITOR) 40 MG tablet Take 1 tablet (40  mg total) by mouth daily at 6 PM.    . busPIRone (BUSPAR) 5 MG tablet Take 1 tablet (5 mg total) by mouth 2 (two) times daily. 180 tablet 3  . calcitRIOL (ROCALTROL) 0.5 MCG capsule Take 1 capsule (0.5 mcg total) by mouth every Monday, Wednesday, and Friday with hemodialysis.    Marland Kitchen carvedilol (COREG) 6.25 MG tablet Take 1 tablet (6.25 mg total) by mouth 2 (two) times daily with a meal. 60 tablet 6  . Darbepoetin Alfa (ARANESP) 200 MCG/0.4ML SOSY injection Inject 0.4 mLs (200 mcg total) into the vein every  Monday with hemodialysis. 1.68 mL   . docusate sodium (COLACE) 250 MG capsule Take 1 capsule (250 mg total) by mouth daily as needed for constipation. 10 capsule 0  . dronabinol (Trevor [name]) 2.5 MG capsule Take 1 capsule (2.5 mg total) by mouth 2 (two) times daily before lunch and supper. 60 capsule 0  . fluticasone (FLOVENT HFA) 110 MCG/ACT inhaler Inhale 2 puffs into the lungs 2 (two) times daily. 3 Inhaler 5  . hydrocerin (EUCERIN) CREA Apply 1 application topically 2 (two) times daily. (Patient taking differently: Apply 1 application topically 2 (two) times daily as needed (dry skin). ) 113 g 0  . insulin glargine (LANTUS) 100 UNIT/ML injection Use 4 units in morning and 6 units at bedtime. (Patient taking differently: Inject 4-6 Units into the skin See admin instructions. Taking 6 units in the am, and 4 units at night.) 10 mL 11  . levothyroxine (SYNTHROID, LEVOTHROID) 25 MCG tablet Take 1 tablet (25 mcg total) by mouth daily before breakfast. 90 tablet 3  . LORazepam (Trevor [name]) 0.5 MG tablet Take 1 tablet (0.5 mg total) by mouth every 8 (eight) hours as needed for anxiety (for nausea or anxiety). 90 tablet 3  . multivitamin (RENA-VIT) TABS tablet Take 1 tablet by mouth at bedtime. 30 tablet 6  . pantoprazole (PROTONIX) 40 MG tablet Take 1 tablet (40 mg total) by mouth daily.    Marland Kitchen senna-docusate (SENOKOT-S) 8.6-50 MG tablet Take 1 tablet by mouth at bedtime as needed for mild constipation.    . sildenafil (REVATIO) 20 MG tablet Take 1 tablet (20 mg total) by mouth 3 (three) times daily. 90 tablet 6  . traZODone (DESYREL) 50 MG tablet Take 2 tablets (100 mg total) by mouth at bedtime. 60 tablet 5  . warfarin (COUMADIN) 5 MG tablet Take 5 mg by mouth daily.     . megestrol (Trevor [name]) 40 MG tablet Take 1 tablet (40 mg total) by mouth daily. 30 tablet 5  . polyethylene glycol (MIRALAX / GLYCOLAX) packet Take 17 g by mouth daily as needed for mild constipation. (Patient not taking: Reported on 06/18/2018) 14  each 0  . Trevor [name] (ULTRAM) 50 MG tablet Take 1 tablet (50 mg total) by mouth every 6 (six) hours as needed. 60 tablet 3   No current facility-administered medications for this encounter.    Facility-Administered Medications Ordered in Other Encounters  Medication Dose Route Frequency Provider Last Rate Last Dose  . octreotide (SANDOSTATIN LAR) IM injection 20 mg  20 mg Intramuscular Q28 days Larey Dresser, MD   20 mg at 07/14/18 1059   Vitals:   07/14/18 1505 07/14/18 1506  BP: (!) 94/0 127/75  Pulse:  74  SpO2:  97%  Weight:  71.9 kg (158 lb 9.6 oz)  Height:  5\' 5"  (1.651 m)    General: Well appearing this am. NAD.  HEENT: Normal. Neck: Supple, JVP 8-9 cm.  Carotids OK.  Cardiac:  Mechanical heart sounds with LVAD hum present.  Lungs:  CTAB, normal effort.  Abdomen:  NT, ND, no HSM. No bruits or masses. +BS  LVAD exit site: Well-healed and incorporated. Dressing dry and intact. No erythema or drainage. Stabilization device present and accurately applied. Driveline dressing changed daily per sterile technique. Extremities:  Warm and dry. No cyanosis, clubbing, rash, or edema.  Neuro:  Alert & oriented x 3. Cranial nerves grossly intact. Moves all 4 extremities w/o difficulty. Affect pleasant    Assessment/Plan: 1. Chronic systolic CHF: Ischemic cardiomyopathy, EF 20-25% with RV dysfunction on 2/18 echo pre-LVAD.  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  Medtronic ICD.  He has had problems with RV failure post-op.  He developed worsening renal dysfunction and finally had to start HD in 8/18. There has been some difficulty getting adequate fluid off him, currently dialyzing 4 days/week.  He was admitted twice recently with dyspnea after HD.  Lowering dry weight seems to have helped his symptoms, also use of Xanax and relaxing after her gets out of HD.  Volume looks better on exam today.  - Continue to aim for lower dry weight.   - Continue warfarin with INR goal 1.8 - 2.3.  No ASA with GI  bleeding.  2. CAD: s/p CABG. No chest pain.  - He is on atorvastatin.  3. ESRD: Ongoing HD 4 days/week, see above.  As he has had trouble with traditional HD, we have looked for other options.  PD is not an option due to prior abdominal surgeries.  Home HD is a possibility, but not sure his sister would always be available to help him. Right now, he is doing better with HD.  4. Atrial fibrillation: Chronic.   - On warfarin. 5. RV failure:  Continue Revatio at 20 mg tid.   6. HTN: MAP stable.  7. 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  - Hgb 9.6 8. GI bleeding: GI AVMs. Now s/p laparoscopic surgical enteroscopy with 4 clips on 04/10/18 at [facility], c/b ischemic bowel/SBO. No evidence currently for overt bleeding. Hgb 9.6 today. No BRBPR/melena.  - Tolerating warfarin. Goal INR 1.8-2.3, adjust to keep in this range.  - Getting Aranesp per nephrology.  - Continue octreotide  9. Squamous cell skin cancer:  Neck excision site without clear margins.  He saw Dr Lisbeth Renshaw with radiation oncology, radiation was recommended.  He wants to hold off for the time being, does not think he can handle radiation as HD wears him out 4 days/week. He now has a nodule concerning for skin cancer on his forehead, he will see Dr. Marla [name] for evaluation of this nodule soon.  10. H/o TAVR:  Stable on last echo.  11. Septic Left Knee: s/p I&D and surgical wash out at [facility] - Getting PT.  - Needs ortho followup.   Loralie Champagne, MD  07/14/2018

## 2018-07-17 ENCOUNTER — Other Ambulatory Visit (HOSPITAL_COMMUNITY): Payer: Self-pay | Admitting: Unknown Physician Specialty

## 2018-07-17 DIAGNOSIS — Z95811 Presence of heart assist device: Secondary | ICD-10-CM

## 2018-07-17 DIAGNOSIS — Z7901 Long term (current) use of anticoagulants: Secondary | ICD-10-CM

## 2018-07-21 ENCOUNTER — Ambulatory Visit (HOSPITAL_COMMUNITY)
Admission: RE | Admit: 2018-07-21 | Discharge: 2018-07-21 | Disposition: A | Discharge status: Home / Self Care | Payer: Medicare Other | Source: Ambulatory Visit | Attending: Internal Medicine | Admitting: Internal Medicine

## 2018-07-21 ENCOUNTER — Ambulatory Visit (HOSPITAL_COMMUNITY): Payer: Self-pay | Admitting: Pharmacist

## 2018-07-21 DIAGNOSIS — F419 Anxiety disorder, unspecified: Secondary | ICD-10-CM

## 2018-07-21 DIAGNOSIS — Z7901 Long term (current) use of anticoagulants: Secondary | ICD-10-CM | POA: Diagnosis not present

## 2018-07-21 DIAGNOSIS — Z4803 Encounter for change or removal of drains: Secondary | ICD-10-CM | POA: Insufficient documentation

## 2018-07-21 DIAGNOSIS — K5903 Drug induced constipation: Secondary | ICD-10-CM

## 2018-07-21 DIAGNOSIS — Z95811 Presence of heart assist device: Secondary | ICD-10-CM | POA: Diagnosis not present

## 2018-07-21 DIAGNOSIS — F320 Major depressive disorder, single episode, mild: Secondary | ICD-10-CM

## 2018-07-21 DIAGNOSIS — G479 Sleep disorder, unspecified: Secondary | ICD-10-CM

## 2018-07-21 LAB — PROTIME-INR
INR: 1.56
PROTHROMBIN TIME: 18.5 s — AB (ref 11.4–15.2)

## 2018-07-21 MED ORDER — DOCUSATE SODIUM 250 MG PO CAPS: 500.0000 mg | ORAL_CAPSULE | Freq: Every day | ORAL | 3 refills | Status: AC

## 2018-07-21 MED ORDER — BUSPIRONE HCL 5 MG PO TABS: 5.0000 mg | ORAL_TABLET | Freq: Two times a day (BID) | ORAL | 3 refills | Status: AC

## 2018-07-21 NOTE — Addendum Note (Signed)
Encounter addended by: Lezlie Octave, RN on: 07/21/2018 9:08 AM  Actions taken: Visit diagnoses modified, Pharmacy for encounter modified, Order list changed, Diagnosis association updated

## 2018-07-21 NOTE — Addendum Note (Signed)
Encounter addended by: Lezlie Octave, RN on: 07/21/2018 9:13 AM  Actions taken: Sign clinical note

## 2018-07-21 NOTE — Progress Notes (Signed)
Patient presents to clinic today for drive line exit wound care. Existing VAD dressing removed and site care performed using sterile technique. Drive line exit site cleaned with Chlora prep applicators x 2, allowed to dry and rinsed with sterile saline due to Chlora prep sensitivity. Sorbaview dressing without Biopatch re-applied. Exit site healed and incorporated, the velour is fully implanted at exit site. Redness under driveline (chronic) with gauze dressing placed for padding. No redness, tenderness, drainage, foul odor or rash noted. Drive line anchor re-applied. Covered Sorbaview with large tegaderm per pt's preference.  Pt denies fever or chills.   Return in 1 week for another dressing change per standard of care. INR to be repeated in 1-2 weeks pending results per anticoagulation protocol.   [name],  RN VAD Coordinator  Office: [phone]  24/7 Pager: [phone]

## 2018-07-23 ENCOUNTER — Encounter (HOSPITAL_COMMUNITY): Payer: Medicare Other

## 2018-07-24 ENCOUNTER — Other Ambulatory Visit (HOSPITAL_COMMUNITY): Payer: Self-pay | Admitting: *Deleted

## 2018-07-24 DIAGNOSIS — Z7901 Long term (current) use of anticoagulants: Secondary | ICD-10-CM

## 2018-07-24 DIAGNOSIS — Z95811 Presence of heart assist device: Secondary | ICD-10-CM

## 2018-07-27 ENCOUNTER — Emergency Department (HOSPITAL_COMMUNITY)
Admission: EM | Admit: 2018-07-27 | Discharge: 2018-08-04 | Disposition: E | Payer: Medicare Other | Attending: Emergency Medicine | Admitting: Emergency Medicine

## 2018-07-27 DIAGNOSIS — Z955 Presence of coronary angioplasty implant and graft: Secondary | ICD-10-CM | POA: Diagnosis not present

## 2018-07-27 DIAGNOSIS — E1122 Type 2 diabetes mellitus with diabetic chronic kidney disease: Secondary | ICD-10-CM | POA: Diagnosis not present

## 2018-07-27 DIAGNOSIS — J449 Chronic obstructive pulmonary disease, unspecified: Secondary | ICD-10-CM | POA: Diagnosis not present

## 2018-07-27 DIAGNOSIS — I2510 Atherosclerotic heart disease of native coronary artery without angina pectoris: Secondary | ICD-10-CM | POA: Diagnosis not present

## 2018-07-27 DIAGNOSIS — Z951 Presence of aortocoronary bypass graft: Secondary | ICD-10-CM | POA: Insufficient documentation

## 2018-07-27 DIAGNOSIS — Z794 Long term (current) use of insulin: Secondary | ICD-10-CM | POA: Diagnosis not present

## 2018-07-27 DIAGNOSIS — I469 Cardiac arrest, cause unspecified: Secondary | ICD-10-CM | POA: Diagnosis present

## 2018-07-27 DIAGNOSIS — Z85828 Personal history of other malignant neoplasm of skin: Secondary | ICD-10-CM | POA: Insufficient documentation

## 2018-07-27 DIAGNOSIS — N186 End stage renal disease: Secondary | ICD-10-CM | POA: Insufficient documentation

## 2018-07-27 DIAGNOSIS — Z7901 Long term (current) use of anticoagulants: Secondary | ICD-10-CM | POA: Diagnosis not present

## 2018-07-27 DIAGNOSIS — Z79899 Other long term (current) drug therapy: Secondary | ICD-10-CM | POA: Diagnosis not present

## 2018-07-28 ENCOUNTER — Other Ambulatory Visit (HOSPITAL_COMMUNITY): Payer: Medicare Other

## 2018-07-30 ENCOUNTER — Telehealth (HOSPITAL_COMMUNITY): Payer: Self-pay | Admitting: *Deleted

## 2018-07-30 NOTE — Telephone Encounter (Signed)
Death certificate completed/signed and faxed to [employer] @ [phone].  Original left at front desk for pickup.  Copy will be scanned to patient's electronic medical record.

## 2018-08-04 NOTE — ED Provider Notes (Signed)
[facility] EMERGENCY DEPARTMENT Provider Note   CSN: [id number] Arrival date & time: July 30, 2018  1228     History   Chief Complaint Chief Complaint  Patient presents with  . LVAD/CPR    HPI Trevor [name] is a 72 y.o. male.  72 year old male with prior medical history as documented below presents for evaluation.  Patient was at dialysis.  Patient has an AICD and LVAD in place.  Per bystanders at dialysis the patient collapsed and became unresponsive.  EMS reports that when they came on scene the patient's LVAD was nonfunctional.  It is unclear exactly how long the patient was down prior to EMS arrival.  EMS reportedly change the LVAD battery and the LVAD began to function.  Patient was apneic and without any respiratory effort.  Patient was bagged and then brought to the ED.  Upon arrival to the ED the patient is still apneic.  The LVAD still has a low flow alert sounding.  Per EMS the patient's AICD has shocked him multiple times.  Initial rhythm on the monitor appears to be fine V. fib.  The history is provided by medical records and the EMS personnel.  Cardiac Arrest  Witnessed by:  Maximino Greenland and healthcare provider Incident location: dialysis  Time since incident:  30 minutes Time before BLS initiated:  > 5 minutes Time before ALS initiated:  3-5 minutes Condition upon EMS arrival:  Apneic and unresponsive Pulse:  Absent Initial cardiac rhythm per EMS:  Ventricular fibrillation Treatments prior to arrival:  ACLS protocol Medications given prior to ED:  Epinephrine Airway:  Bag valve mask Rhythm on admission to ED:  Agonal   Past Medical History:  Diagnosis Date  . AICD (automatic cardioverter/defibrillator) present   . Asthma   . AVM (arteriovenous malformation) of colon 07/07/2017  . CHF (congestive heart failure) ([place])   . Diabetes ([place])   . ESRF (end stage renal failure) ([place]) 07/07/2017   pt receiving hemodialysis Monday- Wednesday-friday,  sometimes Saturday  . Kidney disease   . LVAD (left ventricular assist device) present ([place]) 12/2016  . Permanent atrial fibrillation ([place]) 07/07/2017  . Presence of permanent cardiac pacemaker    AICD  . Sleep apnea     Patient Active Problem List   Diagnosis Date Noted  . Nausea and vomiting 06/25/2018  . SOB (shortness of breath) 06/22/2018  . Subtherapeutic international normalized ratio (INR)   . Arterial hypotension   . Leukocytosis   . Acute blood loss anemia   . History of GI bleed   . Physical debility 05/05/2018  . Debility   . Diabetes mellitus type 2 in nonobese (HCC)   . ESRD on dialysis ([place])   . Pyogenic arthritis of left knee joint ([place])   . Sleep disturbance   . Generalized anxiety disorder   . Ileus ([place])   . Arteriovenous malformation   . GI bleed 03/09/2018  . Small bowel bleed not requiring more than 4 units of blood in 24 hours, ICU, or surgery   . Heme positive stool   . Cellulitis 12/03/2017  . 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 Chronic systolic CHF (congestive heart failure) ([place]) 09/08/2017  . ESRD (end stage renal disease) ([place]) 07/07/2017  . AVM (arteriovenous malformation) of colon 07/07/2017  . Thrombocytopenia ([place]) 07/07/2017  . Permanent atrial fibrillation ([place]) 07/07/2017  . Anemia of chronic disease 07/07/2017  . Acute on chronic systolic (congestive) heart failure ([place]) 03/27/2017  .  AVM (arteriovenous malformation) of colon with hemorrhage   . Symptomatic anemia 02/10/2017  . Type 2 diabetes mellitus ([place]) 01/17/2017  . COPD (chronic obstructive pulmonary disease) ([place]) 01/17/2017  . LVAD (left ventricular assist device) present ([place]) 01/09/2017  . Presence of left ventricular assist device (LVAD) ([place])   . Palliative care encounter   . Cardiorenal syndrome with renal failure 11/28/2016  . Cardiomyopathy, ischemic 11/27/2016  . Coronary artery disease 11/27/2016  . Status post aortic valve replacement 11/27/2016  .  Asthma, chronic 11/27/2016  . OSA (obstructive sleep apnea) 11/27/2016    Past Surgical History:  Procedure Laterality Date  . AORTIC VALVE REPLACEMENT  2015   [place]  . APPLICATION OF A-CELL OF EXTREMITY Left 11/20/2017   Procedure: APPLICATION OF A-CELL;  Surgeon: Wallace Going, DO;  Location: [facility];  Service: Plastics;  Laterality: Left;  . AV FISTULA PLACEMENT Left 06/30/2017   Procedure: CREATION of LEFT ARM Brachiocephalic Fistula;  Surgeon: Conrad El Segundo, MD;  Location: [facility];  Service: Vascular;  Laterality: Left;  . CARDIAC CATHETERIZATION N/A 12/02/2016   Procedure: Right Heart Cath;  Surgeon: Larey Dresser, MD;  Location: [facility];  Service: Cardiovascular;  Laterality: N/A;  . COLONOSCOPY N/A 02/14/2017   Procedure: COLONOSCOPY;  Surgeon: Milus Banister, MD;  Location: [facility];  Service: Endoscopy;  Laterality: N/A;  . COLONOSCOPY WITH PROPOFOL N/A 02/27/2018   Procedure: COLONOSCOPY WITH PROPOFOL;  Surgeon: Jerene Bears, MD;  Location: [facility];  Service: Gastroenterology;  Laterality: N/A;  . CORONARY ANGIOPLASTY WITH STENT PLACEMENT  2013   in [place]  . CORONARY ARTERY BYPASS GRAFT  2010   in [place]  . DIALYSIS/PERMA CATHETER INSERTION  06/30/2017   Procedure: INSERTION Dialysis Catheter;  Surgeon: Conrad Dunklin, MD;  Location: [facility] OR;  Service: Vascular;;  . ESOPHAGOGASTRODUODENOSCOPY (EGD) WITH PROPOFOL N/A 02/27/2018   Procedure: ESOPHAGOGASTRODUODENOSCOPY (EGD) WITH PROPOFOL;  Surgeon: Jerene Bears, MD;  Location: [facility] ENDOSCOPY;  Service: Gastroenterology;  Laterality: N/A;  . GIVENS CAPSULE STUDY N/A 02/27/2018   Procedure: GIVENS CAPSULE STUDY;  Surgeon: Jerene Bears, MD;  Location: [facility];  Service: Gastroenterology;  Laterality: N/A;  . GIVENS CAPSULE STUDY N/A 03/25/2018   Procedure: GIVENS CAPSULE STUDY;  Surgeon: Jackquline Denmark, MD;  Location: [facility] ENDOSCOPY;  Service: Endoscopy;  Laterality: N/A;  . IABP INSERTION N/A 12/11/2016    Procedure: IABP Insertion;  Surgeon: Larey Dresser, MD;  Location: [facility] CV LAB;  Service: Cardiovascular;  Laterality: N/A;  . INSERTION OF IMPLANTABLE LEFT VENTRICULAR ASSIST DEVICE N/A 12/13/2016   Procedure: INSERTION OF IMPLANTABLE LEFT VENTRICULAR ASSIST DEVICE;  Surgeon: Ivin Poot, MD;  Location: [facility];  Service: Open Heart Surgery;  Laterality: N/A;  HEARTMATE II  NITRIC OXIDE  . IR FLUORO GUIDE CV LINE LEFT  06/17/2017  . IR US GUIDE VASC ACCESS LEFT  06/17/2017  . MASS EXCISION Left 11/20/2017   Procedure: EXCISION OF LEFT NECK AND RIGHT LEG SKIN CANCER WITH A CELL PLACEMENT;  Surgeon: Wallace Going, DO;  Location: [facility];  Service: Plastics;  Laterality: Left;  Marland Kitchen MASS EXCISION Left 11/26/2017   Procedure: EXPLORATION AND EXCISION OF LEFT NECK;  Surgeon: Wallace Going, DO;  Location: [facility];  Service: Plastics;  Laterality: Left;  . RIGHT HEART CATH N/A 12/11/2016   Procedure: Right Heart Cath;  Surgeon: Larey Dresser, MD;  Location: [facility] CV LAB;  Service: Cardiovascular;  Laterality: N/A;  .  RIGHT HEART CATH N/A 03/27/2017   Procedure: Right Heart Cath;  Surgeon: Larey Dresser, MD;  Location: [facility] CV LAB;  Service: Cardiovascular;  Laterality: N/A;  . RIGHT HEART CATH N/A 06/13/2017   Procedure: RIGHT HEART CATH;  Surgeon: Jolaine Artist, MD;  Location: [facility];  Service: Cardiovascular;  Laterality: N/A;  . TEE WITHOUT CARDIOVERSION N/A 12/13/2016   Procedure: TRANSESOPHAGEAL ECHOCARDIOGRAM (TEE);  Surgeon: Ivin Poot, MD;  Location: [facility];  Service: Open Heart Surgery;  Laterality: N/A;  . TRICUSPID VALVE REPLACEMENT N/A 12/13/2016   Procedure: TRICUSPID VALVE REPAIR WITH EDWARDS MC 3 TRICUSPID ANNULOPLASTY RING MODEL 4900 SIZE T 28;  Surgeon: Ivin Poot, MD;  Location: [facility];  Service: Open Heart Surgery;  Laterality: N/A;        Home Medications    Prior to Admission medications   Medication Sig Start Date End Date  Taking? Authorizing Provider  acetaminophen (TYLENOL) 325 MG tablet Take 2 tablets (650 mg total) by mouth every 4 (four) hours as needed for headache or mild pain. 03/10/18   Shirley Friar, PA-C  albuterol (PROVENTIL HFA;VENTOLIN HFA) 108 (90 Base) MCG/ACT inhaler Inhale 2 puffs into the lungs every 4 (four) hours as needed for wheezing or shortness of breath. 02/10/18   Larey Dresser, MD  albuterol (PROVENTIL) (2.5 MG/3ML) 0.083% nebulizer solution Take 2.5 mg by nebulization every 4 (four) hours as needed for wheezing or shortness of breath.    [provider]  atorvastatin (LIPITOR) 40 MG tablet Take 1 tablet (40 mg total) by mouth daily at 6 PM. 06/18/18   Larey Dresser, MD  busPIRone (BUSPAR) 5 MG tablet Take 1 tablet (5 mg total) by mouth 2 (two) times daily. 07/21/18   Larey Dresser, MD  calcitRIOL (ROCALTROL) 0.5 MCG capsule Take 1 capsule (0.5 mcg total) by mouth every Monday, Wednesday, and Friday with hemodialysis. 03/11/18   Shirley Friar, PA-C  carvedilol (COREG) 6.25 MG tablet Take 1 tablet (6.25 mg total) by mouth 2 (two) times daily with a meal. 07/01/18   Georgiana Shore, NP  Darbepoetin Alfa (ARANESP) 200 MCG/0.4ML SOSY injection Inject 0.4 mLs (200 mcg total) into the vein every Monday with hemodialysis. 04/06/18   Clegg, Amy D, NP  docusate sodium (COLACE) 250 MG capsule Take 2 capsules (500 mg total) by mouth daily. 07/21/18   Larey Dresser, MD  dronabinol (MARINOL) 2.5 MG capsule Take 1 capsule (2.5 mg total) by mouth 2 (two) times daily before lunch and supper. 07/01/18   Georgiana Shore, NP  fluticasone (FLOVENT HFA) 110 MCG/ACT inhaler Inhale 2 puffs into the lungs 2 (two) times daily. 02/10/18   Larey Dresser, MD  hydrocerin (EUCERIN) CREA Apply 1 application topically 2 (two) times daily. Patient taking differently: Apply 1 application topically 2 (two) times daily as needed (dry skin).  05/19/18   Shirley Friar, PA-C  insulin glargine  (LANTUS) 100 UNIT/ML injection Use 4 units in morning and 6 units at bedtime. Patient taking differently: Inject 4-6 Units into the skin See admin instructions. Taking 6 units in the am, and 4 units at night. 05/19/18   Love, Ivan Anchors, PA-C  levothyroxine (SYNTHROID, LEVOTHROID) 25 MCG tablet Take 1 tablet (25 mcg total) by mouth daily before breakfast. 10/02/17   Larey Dresser, MD  LORazepam (ATIVAN) 0.5 MG tablet Take 1 tablet (0.5 mg total) by mouth every 8 (eight) hours as needed for anxiety (  for nausea or anxiety). 07/14/18   Larey Dresser, MD  megestrol (MEGACE) 40 MG tablet Take 1 tablet (40 mg total) by mouth daily. 06/12/18   Shirley Friar, PA-C  multivitamin (RENA-VIT) TABS tablet Take 1 tablet by mouth at bedtime. 05/19/18   Shirley Friar, PA-C  pantoprazole (PROTONIX) 40 MG tablet Take 1 tablet (40 mg total) by mouth daily. 06/18/18   Larey Dresser, MD  polyethylene glycol [facility] / Floria Raveling) packet Take 17 g by mouth daily as needed for mild constipation. Patient not taking: Reported on 06/18/2018 05/19/18   Shirley Friar, PA-C  senna-docusate (SENOKOT-S) 8.6-50 MG tablet Take 1 tablet by mouth at bedtime as needed for mild constipation. 07/01/18   Georgiana Shore, NP  sildenafil (REVATIO) 20 MG tablet Take 1 tablet (20 mg total) by mouth 3 (three) times daily. 05/19/18   Love, Ivan Anchors, PA-C  traMADol (ULTRAM) 50 MG tablet Take 1 tablet (50 mg total) by mouth every 6 (six) hours as needed. 07/14/18   Larey Dresser, MD  traZODone (DESYREL) 50 MG tablet Take 2 tablets (100 mg total) by mouth at bedtime. 06/18/18   Larey Dresser, MD  warfarin (COUMADIN) 5 MG tablet Take 5 mg (1 tablet) daily except 7.5 mg (1 and 1/2 tablets) on Tues/Thurs    [provider]    Family History Family History  Problem Relation Age of Onset  . Heart failure Father   . Heart attack Father   . Healthy Mother   . Diabetes Paternal Grandfather     Social  History Social History   Tobacco Use  . Smoking status: Never Smoker  . Smokeless tobacco: Never Used  Substance Use Topics  . Alcohol use: No  . Drug use: No     Allergies   Patient has no known allergies.   Review of Systems Review of Systems  Unable to perform ROS: Acuity of condition     Physical Exam Updated Vital Signs Pulse (!) 0   Temp (!) 95.8 F (35.4 C) (Temporal)   Resp (!) 0   Physical Exam  Constitutional: He appears distressed.  Patient is unresponsive  Apneic with bag-valve-mask assisted ventilations  Pulseless  Cool and mottled skin  HENT:  Head: Normocephalic and atraumatic.  Mouth/Throat: Oropharynx is clear and moist.  Eyes:  Pupils fixed and dilatated bilaterally  Cardiovascular:  LVAD in place with LVAD alarming "low flow"  Pulmonary/Chest: No respiratory distress.  Apneic with bag valve mask assisted ventilation  Abdominal: Soft. He exhibits no distension. There is no tenderness.  Musculoskeletal: He exhibits no edema or deformity.  Skin:  Skin is cool and mottled  Nursing note and vitals reviewed.    ED Treatments / Results  Labs (all labs ordered are listed, but only abnormal results are displayed) Labs Reviewed - No data to display  EKG None  Radiology No results found.  Procedures Procedures (including critical care time) CRITICAL CARE Performed by: Valarie Merino   Total critical care time: 20 minutes  Critical care time was exclusive of separately billable procedures and treating other patients.  Critical care was necessary to treat or prevent imminent or life-threatening deterioration.  Critical care was time spent personally by me on the following activities: development of treatment plan with patient and/or surrogate as well as nursing, discussions with consultants, evaluation of patient's response to treatment, examination of patient, obtaining history from patient or surrogate, ordering and performing  treatments and interventions, ordering  and review of laboratory studies, ordering and review of radiographic studies, pulse oximetry and re-evaluation of patient's condition.   Medications Ordered in ED Medications - No data to display   Initial Impression / Assessment and Plan / ED Course  I have reviewed the triage vital signs and the nursing notes.  Pertinent labs & imaging results that were available during my care of the patient were reviewed by me and considered in my medical decision making (see chart for details).     MDM  Screen complete  Patient is presenting in cardiac arrest.  Patient with a long-standing history of significant cardiac disease and is currently with both an AICD and an LVAD in place.  LVAD team is present in the Resuscitation room upon patient's arrival.  After initial assessment, case was discussed with [name] of the LVAD team.  Additional attempts at resuscitation are futile.  Patient was pronounced at 1233.  [name] will sign the death certificate.  Final Clinical Impressions(s) / ED Diagnoses   Final diagnoses:  Cardiac arrest [facility])    ED Discharge Orders    None       Valarie Merino, MD 08/07/2018 1256

## 2018-08-04 NOTE — Progress Notes (Signed)
RT NOTE: Patient brought in from dialysis center. No airway in place, patient was bagged with Ambu bag 100% Fi02 until time of death pronounced at 12:33.

## 2018-08-04 NOTE — Code Documentation (Addendum)
Pt arrives to trauma Bay with [facility] from dialysis center in cardiac arrest. Per ems dialysis center called out for unresponsiveness after half way through dialysis he slumped over- On ems arrival LVAD pump was off with no alarms sounding. Ems switched both batteries and LVAD began to alarm. Ems gave 1 mg epi and shocked pt 6 times. Pt arrives to trauma bay in what appeared to be fine vfib- pt shocked once at [place]. Pt placed on LVAD machine by LVAD RN Cloyde Reams. No pulses noted with ultrasound and time of death called by MD Messick at 1233.

## 2018-08-04 DEATH — deceased

## 2018-08-06 ENCOUNTER — Institutional Professional Consult (permissible substitution): Payer: Medicare Other | Admitting: Plastic Surgery

## 2018-08-11 ENCOUNTER — Encounter (HOSPITAL_COMMUNITY): Payer: Medicare Other

## 2018-09-15 ENCOUNTER — Encounter (HOSPITAL_COMMUNITY): Payer: Medicare Other
